# Patient Record
Sex: Female | Born: 1950 | Race: White | Hispanic: No | Marital: Married | Smoking: Former smoker
Health system: Southern US, Community
[De-identification: ages and names within clinical notes are randomized; demographics above are authoritative.]

## PROBLEM LIST (undated history)

## (undated) DIAGNOSIS — G43909 Migraine, unspecified, not intractable, without status migrainosus: Secondary | ICD-10-CM

## (undated) DIAGNOSIS — E785 Hyperlipidemia, unspecified: Secondary | ICD-10-CM

## (undated) DIAGNOSIS — I10 Essential (primary) hypertension: Secondary | ICD-10-CM

## (undated) DIAGNOSIS — M5481 Occipital neuralgia: Secondary | ICD-10-CM

## (undated) DIAGNOSIS — D649 Anemia, unspecified: Secondary | ICD-10-CM

## (undated) DIAGNOSIS — N289 Disorder of kidney and ureter, unspecified: Secondary | ICD-10-CM

## (undated) DIAGNOSIS — Z8673 Personal history of transient ischemic attack (TIA), and cerebral infarction without residual deficits: Secondary | ICD-10-CM

## (undated) DIAGNOSIS — N189 Chronic kidney disease, unspecified: Secondary | ICD-10-CM

## (undated) DIAGNOSIS — K219 Gastro-esophageal reflux disease without esophagitis: Secondary | ICD-10-CM

## (undated) DIAGNOSIS — I639 Cerebral infarction, unspecified: Secondary | ICD-10-CM

## (undated) DIAGNOSIS — M5412 Radiculopathy, cervical region: Secondary | ICD-10-CM

## (undated) HISTORY — DX: Hyperlipidemia, unspecified: E78.5

## (undated) HISTORY — PX: LUNG SURGERY: SHX703

## (undated) HISTORY — PX: KNEE SURGERY: SHX244

## (undated) HISTORY — PX: CERVICAL FUSION: SHX112

## (undated) HISTORY — DX: Chronic kidney disease, unspecified: N18.9

## (undated) HISTORY — DX: Migraine, unspecified, not intractable, without status migrainosus: G43.909

## (undated) HISTORY — DX: Occipital neuralgia: M54.81

## (undated) HISTORY — PX: ELBOW SURGERY: SHX618

## (undated) HISTORY — DX: Cerebral infarction, unspecified: I63.9

## (undated) HISTORY — DX: Gastro-esophageal reflux disease without esophagitis: K21.9

## (undated) HISTORY — PX: CARPAL TUNNEL RELEASE: SHX101

## (undated) HISTORY — PX: ABDOMINAL HYSTERECTOMY: SHX81

## (undated) HISTORY — PX: BLADDER REPAIR: SHX76

## (undated) HISTORY — PX: CERVICAL SPINE SURGERY: SHX589

---

## 1997-08-25 ENCOUNTER — Other Ambulatory Visit: Admission: RE | Admit: 1997-08-25 | Discharge: 1997-08-25 | Payer: Self-pay | Admitting: Gynecology

## 1997-10-04 ENCOUNTER — Inpatient Hospital Stay (HOSPITAL_COMMUNITY): Admission: RE | Admit: 1997-10-04 | Discharge: 1997-10-06 | Payer: Self-pay | Admitting: Gynecology

## 1998-06-18 ENCOUNTER — Encounter: Payer: Self-pay | Admitting: Emergency Medicine

## 1998-06-18 ENCOUNTER — Emergency Department (HOSPITAL_COMMUNITY): Admission: EM | Admit: 1998-06-18 | Discharge: 1998-06-18 | Payer: Self-pay | Admitting: Emergency Medicine

## 1998-09-21 ENCOUNTER — Other Ambulatory Visit: Admission: RE | Admit: 1998-09-21 | Discharge: 1998-09-21 | Payer: Self-pay | Admitting: Gynecology

## 1998-11-14 ENCOUNTER — Ambulatory Visit (HOSPITAL_COMMUNITY): Admission: RE | Admit: 1998-11-14 | Discharge: 1998-11-14 | Payer: Self-pay | Admitting: Orthopedic Surgery

## 1998-11-14 ENCOUNTER — Encounter: Payer: Self-pay | Admitting: Orthopedic Surgery

## 1999-05-10 ENCOUNTER — Encounter: Payer: Self-pay | Admitting: Neurosurgery

## 1999-05-14 ENCOUNTER — Encounter: Payer: Self-pay | Admitting: Neurosurgery

## 1999-05-14 ENCOUNTER — Ambulatory Visit (HOSPITAL_COMMUNITY): Admission: RE | Admit: 1999-05-14 | Discharge: 1999-05-15 | Payer: Self-pay | Admitting: Neurosurgery

## 1999-06-06 ENCOUNTER — Encounter: Payer: Self-pay | Admitting: Neurosurgery

## 1999-06-06 ENCOUNTER — Ambulatory Visit (HOSPITAL_COMMUNITY): Admission: RE | Admit: 1999-06-06 | Discharge: 1999-06-06 | Payer: Self-pay | Admitting: Neurosurgery

## 1999-10-25 ENCOUNTER — Other Ambulatory Visit: Admission: RE | Admit: 1999-10-25 | Discharge: 1999-10-25 | Payer: Self-pay | Admitting: Gynecology

## 2000-11-05 ENCOUNTER — Other Ambulatory Visit: Admission: RE | Admit: 2000-11-05 | Discharge: 2000-11-05 | Payer: Self-pay | Admitting: Gynecology

## 2001-11-03 ENCOUNTER — Encounter: Payer: Self-pay | Admitting: Family Medicine

## 2001-11-03 ENCOUNTER — Encounter: Admission: RE | Admit: 2001-11-03 | Discharge: 2001-11-03 | Payer: Self-pay | Admitting: Family Medicine

## 2001-11-15 ENCOUNTER — Other Ambulatory Visit: Admission: RE | Admit: 2001-11-15 | Discharge: 2001-11-15 | Payer: Self-pay | Admitting: Gynecology

## 2002-11-17 ENCOUNTER — Other Ambulatory Visit: Admission: RE | Admit: 2002-11-17 | Discharge: 2002-11-17 | Payer: Self-pay | Admitting: Gynecology

## 2003-11-20 ENCOUNTER — Other Ambulatory Visit: Admission: RE | Admit: 2003-11-20 | Discharge: 2003-11-20 | Payer: Self-pay | Admitting: Gynecology

## 2004-05-24 ENCOUNTER — Other Ambulatory Visit: Admission: RE | Admit: 2004-05-24 | Discharge: 2004-05-24 | Payer: Self-pay | Admitting: Gynecology

## 2004-11-20 ENCOUNTER — Other Ambulatory Visit: Admission: RE | Admit: 2004-11-20 | Discharge: 2004-11-20 | Payer: Self-pay | Admitting: Gynecology

## 2005-11-24 ENCOUNTER — Other Ambulatory Visit: Admission: RE | Admit: 2005-11-24 | Discharge: 2005-11-24 | Payer: Self-pay | Admitting: Gynecology

## 2006-12-17 ENCOUNTER — Ambulatory Visit (HOSPITAL_COMMUNITY): Admission: RE | Admit: 2006-12-17 | Discharge: 2006-12-17 | Payer: Self-pay | Admitting: Family Medicine

## 2006-12-17 ENCOUNTER — Encounter (INDEPENDENT_AMBULATORY_CARE_PROVIDER_SITE_OTHER): Payer: Self-pay | Admitting: Interventional Radiology

## 2006-12-24 ENCOUNTER — Emergency Department (HOSPITAL_COMMUNITY): Admission: EM | Admit: 2006-12-24 | Discharge: 2006-12-24 | Payer: Self-pay | Admitting: Emergency Medicine

## 2007-01-06 ENCOUNTER — Ambulatory Visit: Payer: Self-pay | Admitting: Cardiothoracic Surgery

## 2007-01-07 ENCOUNTER — Inpatient Hospital Stay (HOSPITAL_COMMUNITY): Admission: EM | Admit: 2007-01-07 | Discharge: 2007-01-16 | Payer: Self-pay | Admitting: Emergency Medicine

## 2007-01-12 ENCOUNTER — Encounter: Payer: Self-pay | Admitting: Cardiothoracic Surgery

## 2007-01-29 ENCOUNTER — Encounter: Admission: RE | Admit: 2007-01-29 | Discharge: 2007-01-29 | Payer: Self-pay | Admitting: Cardiothoracic Surgery

## 2007-01-29 ENCOUNTER — Ambulatory Visit: Payer: Self-pay | Admitting: Cardiothoracic Surgery

## 2007-02-26 ENCOUNTER — Ambulatory Visit: Payer: Self-pay | Admitting: Cardiothoracic Surgery

## 2007-05-07 ENCOUNTER — Encounter: Admission: RE | Admit: 2007-05-07 | Discharge: 2007-05-07 | Payer: Self-pay | Admitting: Cardiothoracic Surgery

## 2007-05-07 ENCOUNTER — Ambulatory Visit: Payer: Self-pay | Admitting: Cardiothoracic Surgery

## 2007-09-03 ENCOUNTER — Encounter: Admission: RE | Admit: 2007-09-03 | Discharge: 2007-09-03 | Payer: Self-pay | Admitting: Family Medicine

## 2007-11-05 ENCOUNTER — Ambulatory Visit: Payer: Self-pay | Admitting: Cardiothoracic Surgery

## 2007-11-05 ENCOUNTER — Encounter: Admission: RE | Admit: 2007-11-05 | Discharge: 2007-11-05 | Payer: Self-pay | Admitting: Cardiothoracic Surgery

## 2007-12-21 ENCOUNTER — Encounter
Admission: RE | Admit: 2007-12-21 | Discharge: 2008-03-20 | Payer: Self-pay | Admitting: Physical Medicine & Rehabilitation

## 2007-12-23 ENCOUNTER — Ambulatory Visit: Payer: Self-pay | Admitting: Physical Medicine & Rehabilitation

## 2008-01-07 ENCOUNTER — Ambulatory Visit: Payer: Self-pay | Admitting: Cardiothoracic Surgery

## 2008-01-17 ENCOUNTER — Ambulatory Visit: Payer: Self-pay | Admitting: Physical Medicine & Rehabilitation

## 2008-02-14 ENCOUNTER — Ambulatory Visit: Payer: Self-pay | Admitting: Physical Medicine & Rehabilitation

## 2008-02-25 ENCOUNTER — Ambulatory Visit: Payer: Self-pay | Admitting: Cardiothoracic Surgery

## 2008-04-17 ENCOUNTER — Encounter: Admission: RE | Admit: 2008-04-17 | Discharge: 2008-07-16 | Payer: Self-pay | Admitting: Anesthesiology

## 2008-04-18 ENCOUNTER — Ambulatory Visit: Payer: Self-pay | Admitting: Anesthesiology

## 2008-05-15 ENCOUNTER — Encounter
Admission: RE | Admit: 2008-05-15 | Discharge: 2008-08-13 | Payer: Self-pay | Admitting: Physical Medicine & Rehabilitation

## 2008-05-16 ENCOUNTER — Ambulatory Visit: Payer: Self-pay | Admitting: Physical Medicine & Rehabilitation

## 2008-06-22 ENCOUNTER — Ambulatory Visit: Payer: Self-pay | Admitting: Physical Medicine & Rehabilitation

## 2008-07-11 ENCOUNTER — Ambulatory Visit: Payer: Self-pay | Admitting: Physical Medicine & Rehabilitation

## 2008-08-03 ENCOUNTER — Ambulatory Visit: Payer: Self-pay | Admitting: Physical Medicine & Rehabilitation

## 2008-09-04 ENCOUNTER — Encounter
Admission: RE | Admit: 2008-09-04 | Discharge: 2008-09-19 | Payer: Self-pay | Admitting: Physical Medicine & Rehabilitation

## 2008-09-12 ENCOUNTER — Ambulatory Visit: Payer: Self-pay | Admitting: Physical Medicine & Rehabilitation

## 2009-12-31 ENCOUNTER — Encounter
Admission: RE | Admit: 2009-12-31 | Discharge: 2010-01-01 | Payer: Self-pay | Source: Home / Self Care | Attending: Physical Medicine & Rehabilitation | Admitting: Physical Medicine & Rehabilitation

## 2010-01-01 ENCOUNTER — Ambulatory Visit: Payer: Self-pay | Admitting: Physical Medicine & Rehabilitation

## 2010-02-05 ENCOUNTER — Encounter: Admission: RE | Admit: 2010-02-05 | Discharge: 2010-02-05 | Payer: Self-pay | Admitting: Family Medicine

## 2010-03-31 ENCOUNTER — Encounter: Payer: Self-pay | Admitting: *Deleted

## 2010-03-31 ENCOUNTER — Encounter: Payer: Self-pay | Admitting: Family Medicine

## 2010-03-31 ENCOUNTER — Encounter: Payer: Self-pay | Admitting: Cardiothoracic Surgery

## 2010-07-15 ENCOUNTER — Encounter: Payer: Worker's Compensation | Attending: Physical Medicine & Rehabilitation

## 2010-07-15 ENCOUNTER — Ambulatory Visit: Payer: Worker's Compensation | Admitting: Physical Medicine & Rehabilitation

## 2010-07-15 DIAGNOSIS — M62838 Other muscle spasm: Secondary | ICD-10-CM | POA: Insufficient documentation

## 2010-07-15 DIAGNOSIS — R079 Chest pain, unspecified: Secondary | ICD-10-CM | POA: Insufficient documentation

## 2010-07-15 DIAGNOSIS — G548 Other nerve root and plexus disorders: Secondary | ICD-10-CM

## 2010-07-15 DIAGNOSIS — R5381 Other malaise: Secondary | ICD-10-CM | POA: Insufficient documentation

## 2010-07-15 DIAGNOSIS — N183 Chronic kidney disease, stage 3 unspecified: Secondary | ICD-10-CM | POA: Insufficient documentation

## 2010-07-15 DIAGNOSIS — Z79899 Other long term (current) drug therapy: Secondary | ICD-10-CM | POA: Insufficient documentation

## 2010-07-15 DIAGNOSIS — R5383 Other fatigue: Secondary | ICD-10-CM | POA: Insufficient documentation

## 2010-07-15 NOTE — Assessment & Plan Note (Signed)
REASON FOR VISIT:  Increasing spasms, left-sided ribcage.  Date of last visit was January 01, 2010.  Date of injury December 07, 2006.  This is a 60 year old female who fell from a ladder onto her left-sided ribs onto a desk.  She sustained a left pleural effusion requiring a chest tube and underwent a mini thoracotomy left fifth rib interspace. She returned to work full-time without restrictions, had relief with T8 and T9 intercostal blocks, but only partial effect from the intercostal RF.  Good relief with Neurontin 100 b.i.d. and then 300 at night when I last saw her, however, increasing spasms have been reported now.  She has a history stage III CKD diagnosed since I last saw her.  She has also been taken off the Lipitor because of overall weakness.  REVIEW OF SYSTEMS:  Positive for spasms mainly back, ribs area.  PHYSICAL EXAMINATION:  VITAL SIGNS:  Blood pressure 165/99, pulse 78, respirations 18, and sat 100% on room air. GENERAL:  In no acute distress.  Mood and affect appropriate.  She has tenderness along her left-sided posterior lateral ribs starting at T5 extending down to about T11.  She has good lumbar range of motion, forward flexion, extension, lateral rotation, and bending.  Gait is normal.  Mood and affect are appropriate.  IMPRESSION:  Intercostal neuralgia, chronic post-traumatic.  Her pain is generalized over a fairly wide area.  PLAN:  We will increase her Neurontin 300 b.i.d.  Continue Lidoderm patch, 2 patches on 12, off 12.  She is to call in 1 month if the b.i.d. 300 mg dose of gabapentin is not sufficient in which case we would go to t.i.d.  If the increased doses of Neurontin are not helpful, she may benefit from repeat radiofrequency procedure.  Her last radiofrequency was performed approximately 2 years ago.  I will see her back for routine monitor in 6 months or sooner if she does not get relief with medication changes.     Erick Colace, M.D. Electronically Signed    AEK/MedQ D:  07/15/2010 10:41:59  T:  07/15/2010 23:28:02  Job #:  [id number]  cc:   Dr. Ethelene Hal [address] [place], [place] [address]

## 2010-07-23 NOTE — Procedures (Signed)
[name], LONSWAY NO.:  [id number]   MEDICAL RECORD NO.:  [id number]           PATIENT TYPE:   LOCATION:                                 FACILITY:   PHYSICIAN:  Erick Colace, M.D.DATE OF BIRTH:  [birth year]   DATE OF PROCEDURE:  DATE OF DISCHARGE:                               OPERATIVE REPORT   PROCEDURE:  Left T9-T10 intercostal nerve block.   INDICATION:  Post-thoracotomy pain syndrome, history of post-thoracotomy  pain on the left, has responded with a 3-week duration of nearly 100% to  prior T8 and T9 intercostal nerve blocks.  This is to further assess  levels and help guide radiofrequency neurotomy as a treatment option.   Informed consent was obtained after describing the risks and benefits of  the procedure with the patient.  These include bleeding, bruising,  infection, and pneumothorax.  She elects to proceed.  The patient placed  prone on fluoroscopy table.  Betadine prep, sterilely drape.  A 27-  gauge, 1-1/2-inch needle was used to anesthetize the skin and subcu  tissue with 1% lidocaine x2 mL.  Then, using 1 mL of 1% lidocaine at  each of two sites, a 25-gauge 1-1/2-inch needle was inserted to touch  the rib under fluoroscopic guidance and walking off the inferior aspect.  Live fluoro with contrast was utilized.  Appropriate contrast spread  followed by injection of 1 mL of 1% lidocaine and 0.5 mL of 40 mg/mL  Depo-Medrol.  The patient tolerated the procedure well.  Pre and post-  injection vitals stable.  Post-injection instructions given.  Pre-  injection pain level 9/10.  Post-injection 0/10.   We will schedule with Dr. Stevphen Rochester for possible radiofrequency evaluation  should pain recur, which I would imagine it should.      Erick Colace, M.D.  Electronically Signed     AEK/MEDQ  D:  02/14/2008 09:08:32  T:  02/14/2008 22:51:31  Job:  [id number]

## 2010-07-23 NOTE — Assessment & Plan Note (Signed)
A 60 year old female who has a work-related injury.  She has a history  of intercostal neuralgia and chronic post-thoracotomy pain syndrome, has  had temporary relief with T9 intercostal blocks, but no continued  improvement.  Radiofrequency neurotomy at T9 did not give her any  significant improvement.  We trialed her on lower dose Neurontin,  however, once again problems with hangover effect in the morning.  She  has been taking 100 mg b.i.d. during the day and 300 at night prior to  that just taking 100 q.i.d., has not tried Lyrica.  She does quite well  with Lidoderm patch, 2 patches on her left side of chest, but she only  wears this during the day and at night, she does not have any topical  agent.   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  She can walk 1 hour at a time.  She can climb steps.  She can  drive.  She is independent with her self-care.   PHYSICAL EXAMINATION:  GENERAL:  No acute stress.  Orientation x3.  Affect is alert.  Gait is normal.  There is tenderness of the left  lateral ribs.  She has normal strength and sensation at lower  extremities.  Normal gait.  Pain score is 9/10.  VITAL SIGNS:  Her blood pressure is 135/84, pulse 89, respirations 18,  and O2 sat 97% on room air.   IMPRESSION:  1. Chronic post-thoracotomy pain syndrome.  2. Intercostal neuralgia.   PLAN:  1. We will continue the Lidoderm patch on q.a.m. and off q.p.m.  2. We will trial Lyrica 50 b.i.d., and then increase to 75 b.i.d.  We      will discontinue Neurontin.  3. I will see her back in 1 month to see how she is doing, may need to      go up on the Lyrica dose.  If she is doing quite well, she may be      at MMI at that time; if not, we may have to titrate or go back to      the Neurontin once again.      Erick Colace, M.D.  Electronically Signed     AEK/MedQ  D:  08/03/2008 15:29:29  T:  08/04/2008 05:20:39  Job #:  [id number]

## 2010-07-23 NOTE — Assessment & Plan Note (Signed)
OFFICE VISIT   [name], [name]  DOB:  [birth year]                                        May 07, 2007  CHART #:  [id number]   CURRENT PROBLEMS:  1. Status post left thoracotomy and decortication for post-traumatic      hemothorax.  2. Hypertension.  3. Post-thoracotomy pain.   HISTORY OF PRESENT ILLNESS:  Ms. [name] returns for final surgical  followup after undergoing decortication November 2008.  Her only  complaint is some incisional soreness and post-thoracotomy stinging pain  on the left inframammary area.  She has no cough, congestion, shortness  of breath or fever.  The surgical incision is well healed.   PHYSICAL EXAMINATION:  VITAL SIGNS:  Blood pressure 120/80, pulse 90,  respirations 18, saturation 97%.  LUNGS:  Breath sounds are clear and equal.  She has good range of motion  of the left upper extremity.  The left thoracotomy incision is well  healed.  CARDIAC:  Rhythm is regular.   A PA and lateral chest x-ray shows complete resolution of the pleural  thickening on the left with clear lungs and no pleural effusion.   IMPRESSION:  Resolution of left hemothorax following decortication.  She  was assured that the post-thoracotomy pain would improve and she could  continue normal daily activities, and she is back to work, which is  appropriate.  She will return as needed.   Kerin Perna, M.D.  Electronically Signed   PV/MEDQ  D:  05/07/2007  T:  05/08/2007  Job:  [id number]

## 2010-07-23 NOTE — Procedures (Signed)
NAMEJAIDENCE, GEISLER NO.:  [id number]   MEDICAL RECORD NO.:  [id number]          PATIENT TYPE:  REC   LOCATION:  [facility]                          FACILITY:  [facility]   PHYSICIAN:  Erick Colace, M.D.DATE OF BIRTH:  [birth year]   DATE OF PROCEDURE:  DATE OF DISCHARGE:                               OPERATIVE REPORT   PROCEDURE:  This is a left T9 and T10 intercostal nerve block.   INDICATIONS:  Post thoracotomy pain syndrome.    Dictation ended at this point.      Erick Colace, M.D.  Electronically Signed     AEK/MEDQ  D:  02/14/2008 09:18:43  T:  02/15/2008 01:21:25  Job:  [id number]   cc:   Kerin Perna, M.D.  [address]  [place]  [place] [address]

## 2010-07-23 NOTE — H&P (Signed)
[name], STREIGHT NO.:  [id number]   MEDICAL RECORD NO.:  [id number]          PATIENT TYPE:  INP   LOCATION:  6738                         FACILITY:  [facility]   PHYSICIAN:  Beckey Rutter, MD  DATE OF BIRTH:  06-17-1950   DATE OF ADMISSION:  01/06/2007  DATE OF DISCHARGE:                              HISTORY & PHYSICAL   PRIMARY CARE PHYSICIAN:  The patient is unassigned to [facility]   CHIEF COMPLAINT:  Fever.   HISTORY OF PRESENT ILLNESS:  This is a 60 year old female with past  medical history significant for hypertension, migraine, and multiple rib  fractures left-sided after fall and trauma, presented today with chief  complaint of fever and generalized weakness.  The patient was in her  usual state of health up to 4 weeks ago when she fell down and hit her  office desk on the left side resulting in pain that was complicated with  severe shortness of breath 2 weeks after that.  The patient was found to  have a hemothorax at that time and she underwent thoracocentesis.  Today  the patient started to feel generalized weakness, fatigue and she  noticed that she has fever.  Patient has minimal cough that is  nonproductive.  The patient saw her urgent care physician that she uses  to see before, when an x-ray was done and suspicion of consolidation on  the left chest wall was noticed and then the patient was sent to the  emergency room for further evaluation and management.   PAST MEDICAL HISTORY:  1. Significant for migraine  2. GERD  3. Hypertension  4. History of four through seven left rib fracture and hemothorax.   FAMILY HISTORY:  Noncontributory.   SOCIAL HISTORY:  No drug abuse, not a drinker, not a smoker.   MEDICATION ALLERGY:  To SHELLFISH.   MEDICATIONS:  1. Lisinopril  2. Topamax  3. Librax  4. Lasix.  5. Nexium.  6. Cipro.   REVIEW OF SYSTEMS:  The patient admits to have fever but denied nausea,  vomiting or headache.  The rest  of review of systems unremarkable.   EXAM:  Temperature is 101.4, respiratory rate is 20, heart rate is 108,  blood pressure is 106/69.  GENERALLY:  She was lying flat in bed not in  acute distress.  HEENT Atraumatic, normocephalic.  Eyes: PERRL.  Mouth moist.  No ulcer.  NECK:  Supple.  No JVD.  CHEST: Tenderness on the left side of the chest diffuse.  On  auscultation the patient has soft crepitation midzone posteriorly on the  left side of the chest.  The right side auscultation is unremarkable.  PRECORDIUM first and second heart sounds audible.  No added sounds  ABDOMEN:  Soft, nontender.  Bowel sounds present.  EXTREMITIES: No lower extremity edema.  NEUROLOGICALLY:  Patient is alert, oriented x3, moving all her  extremities spontaneously.   LABS AND X-RAY:  White blood count is 9.7, hemoglobin is 13.1,  hematocrit is 39.2, platelet count is 307. Sodium is 139, potassium 3.4,  chloride 105, bicarb 26, BUN 17, glucose  126, creatinine is 1.4.  Chest X-Ray: The patient brought result from outside written and the  employee's work status report showing consolidation pneumonia/effusion.  The patient also has CT chest on December 24, 2006 showing large pleural  effusion.   ASSESSMENT AND PLAN:  This is a 60 year old female with hypertension,  came today with pneumonia.  The possibility of empyema cannot be ruled  out as of now and for that we will consider CT chest.  1. The patient will be admitted for further assessment and management.  2. CT chest to rule out empyema.  3. Will start antibiotics rochephin and Zithromax  4. Further management will be determined after the result of CT chest.   1. Hypertension.  The patient has taken lisinopril for hypertension.      We will continue on lisinopril.   1. Migraine will continue the patient on Topamax.   1. For GI prophylaxis I will start the patient on Nexium which the      patient has taking before. For DVT prophylaxis I will start  the      patient on sequential pneumatic device pending the result of CAT      scan.      Beckey Rutter, MD  Electronically Signed     EME/MEDQ  D:  01/07/2007  T:  01/07/2007  Job:  [id number]

## 2010-07-23 NOTE — Group Therapy Note (Signed)
CONSULT REQUESTED BY:  Kerin Perna, MD   DATE OF INJURY:  As reported by the patient, December 07, 2006.   CHIEF COMPLAINT:  Left-sided rib pain.   REASON FOR CONSULTATION:  Consult requested by Dr. Kathlee Nations Trigt to  evaluate for left post-thoracotomy pain syndrome.   HISTORY OF PRESENT ILLNESS:  Ms. Achey is a 60 year old female who had  a past medical history significant for cervical spondylosis with ACDF C4-  C5 level several years ago, but was otherwise in good health.  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  She had immediate onset of pain; however, it did progress over  time, and she sought medical treatment at an Urgent [facility] Center.  She  was evaluated and not felt need any acute hospitalization.  However, she  did have increasing pain over time and presented to the emergency room  on December 24, 2007 with a left pleural effusion.  She had another ED  visit 2 weeks later on January 07, 2008, at which time she was admitted,  had chest tube placed, and then underwent attempted video-assisted  thoracoscopy, but because of excessive adhesions in the area, had to  actually have a mini thoracotomy done at the left fifth interspace.  She  had no postoperative complications, did have some antibiotics for  infection in that region.  She states that her pain characteristics  changed somewhat after the surgery, although she had really had pain in  this area ever since this fall.   This is a Workers' Compensation injury and has not been settled yet.   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  She  has taken some hydrocodone at night, but does not want to take this  during the day.  She has not tried any neuropathic pain medications, any  type of topical agents other than  OTC counter irritant patches.   REVIEW OF SYSTEMS:  Positive for night sweats, but no daytime fevers.   PAST SURGICAL HISTORY:  Notable for C4-C5 laminectomy ACDF per Dr.  Gerlene Fee in 2003.   SOCIAL HISTORY:  Married, lives with her husband, works full-time.   Oswestry disability score calculated today 42% putting her in the severe  range.   PHYSICAL EXAMINATION:  VITAL SIGNS:  Blood pressure 144/91, pulse 106,  respiratory rate 18, and O2 sat 99% on room air.  GENERAL:  Well-developed, well-nourished female in no acute distress.  EXTREMITIES:  Without edema.  Orientation x3.  Affect is alert.  She  appears to be mildly anxious.  Her gait is normal.  She is able to toe  walk or heel walk.  She has normal coordination in bilateral upper and  lower extremities.  Normal deep tendon reflexes in bilateral upper and  lower extremities.  Normal sensation.  She has 5/5 strength bilateral  deltoid, biceps, triceps, grip as well as hip flexion, knee flexion, or  ankle dorsiflexors.  She has full range of motion bilateral upper and  lower extremities.  She has full lumbar spine and cervical spine range  of motion as well as thoracic range of motion.  However, when she bends  toward the left side laterally, she does have pain around the fifth,  sixth, and  seventh rib area.   Her thoracic spine area is tender to palpation, however, in the  posterolateral angle of the ribs.  She does have tenderness starting  around the incision site, which is at around the T6 area.  She has mild  tenderness around the chest tube site, which is 2 levels below.   She has no skin lesions in that area.  She has no scar hypersensitivity.   IMPRESSION:  Post-thoracotomy pain syndrome.  I feel that she has an  intercostal neuralgia component as well as a myofascial component.   PLAN:  1. We will go ahead and start with some gabapentin 100 mg nightly x3      days, b.i.d. x3 days, then t.i.d.  2. We will start  Lidoderm patch over that area, on every morning, off      every evening.  3. Scheduled for intercostal nerve block under fluoroscopic guidance.      I explained the risks and benefits including pneumothorax and      infection.  She elects to proceed.  4. We will check a urine drug screen in the event we use narcotic      analgesics.   She has not reached maximum medical improvement yet.  I do not  anticipate any partial permanent disability rating and expect that while  she will have some improvement with pain management treatment, it is  likely she will not get back to her baseline.      Erick Colace, M.D.  Electronically Signed     AEK/MedQ  D:  12/23/2007 16:25:08  T:  12/24/2007 00:38:34  Job #:  [id number]   cc:   Kerin Perna, M.D.  [address]  [place]  [place] [address]

## 2010-07-23 NOTE — Assessment & Plan Note (Signed)
Ms. Santucci follows up today.  She was seen by me on June 23, 2008, with  left-sided T8 intercostal nerve block which gave her good relief for 1  day.  She had a previous RF T9 which did not give her any significant  improvement, where as the prior injection did.  She has history of a  fall hitting her left wrist in the corner of a desk, then she developed  pleural effusion, needed chest tube placement and underwent  minithoracotomy of the left fifth interspace.  No postoperative  complications.  She has done reasonably well during the day with  Lidoderm patch over that area and night is her main problem.  She has  difficulty sleeping.  Pain is worse at night around 9.  During the day,  it is tolerable.  Her Oswestry disability index is 40%, this is not a  rating, however.   PHYSICAL EXAMINATION:  VITAL SIGNS:  Her blood pressure is 145/87, pulse  108, respirations 18, and 02 saturation is 98% on room air.  GENERAL:  No acute stress.  Orientation x3.  Affect is alert.  Gait is  normal.  EXTREMITIES:  There is tenderness over the left lateral ribs.  She has  normal strength and sensation in the bilateral lower extremity, normal  deep tendon reflexes, and normal gait.   IMPRESSION:  Intercostal neuralgia with chronic post-thoracotomy pain.  She has gotten temporary relief with intercostal nerve blocks at T9  levels.  However, no continued improvement.  I think the main goal at  this point is getting her more comfortable at night by increasing  Neurontin at night.  We will increase to 300 mg at night and continue  the 100 mg b.i.d. during the day.  I will see her back in 1 month.  If  not much better, we will increase her Neurontin once again to 600 at  night and trial 300 b.i.d. during the day.  We will continue Lidoderm  patch.  Discussed with the patient and her case Production designer, theatre/television/film.  If she is  sleeping better through the night, next month we will just put her at  MMI and rate her.      Erick Colace, M.D.  Electronically Signed     AEK/MedQ  D:  07/11/2008 17:28:55  T:  07/12/2008 81:19:14  Job #:  [id number]   cc:   Kerin Perna, M.D.  [address]  [place]  [place] [address]   [place] [phone]

## 2010-07-23 NOTE — Procedures (Signed)
[name], [name] NO.:  [id number]   MEDICAL RECORD NO.:  [id number]           PATIENT TYPE:   LOCATION:                                 FACILITY:   PHYSICIAN:  Celene Kras, MD             DATE OF BIRTH:   DATE OF PROCEDURE:  DATE OF DISCHARGE:                               OPERATIVE REPORT   [name] comes to [facility].  I evaluated  her and reviewed the Health and History form and 14-point review of  systems.   1. Reviewed the chart, progress to date overall directed care      approach.  An individual with radicular pain secondary to rib pain      and injury, demonstrated a positive predictive experience with      greater than 85% relief cycling after Dr. Trecia Rogers intercostal      block.  2. It is reasonable to go on to RF, to use pulse technology.  I have      reviewed this with her.  She has had 9 and 10-block, she has also      an 8 pain, almost started what I perceive is the most problematic      at 8, and then I will follow expectantly.  I have reviewed this      procedure in detail.  Risks, complications, and options are fully      outlined.  The patient familiar with me.  We will follow her      expectantly.   Objectively, she has the incision as well noted.  Diffuse parathoracic  myofascial discomfort and pain with side bending.  Some peri-incisional  discomfort, dominant left.  Nothing new neurologically.   IMPRESSION:  Peripheral neuropathy, unspecified; intercostal neuralgia,  left.   PLAN:  Pulsed radiofrequency neural ablation, peripheral nerve  modulation, left side, T9, under local anesthetic, and she is consented.  Predicated further intervention based on need.  Understands potential  for pneumothorax, bleeding, infection, nerve damage, stroke, seizure,  death, other unforeseen problems not commonly encountered, particularly  reactive response unexpectedly to drug.  She is consented.   The patient was  taken to fluoroscopy suite and placed in prone position.  Back prepped and draped in usual fashion using a 10-mm active tip,  inferior margin of T9, and confirmed placement in multiple fluoroscopic  positions.  Once confirmation is made, we appropriately test, and then  follow with 2 mL of Marcaine 0.5% MPF at each level at T9, and 40 of  Aristocort.   We then performed sequential pulsed radiofrequency procedure, 100  seconds, at 42 degrees.   She tolerated the procedure well.  No complications from our procedure.  Discharge instructions given.  We will consider sequential intervention  to adjacent rib as needed.  I will see her in 1 month.           ______________________________  Celene Kras, MD    HH/MEDQ  D:  04/18/2008 10:52:32  T:  04/18/2008 23:37:59  Job:  [id number]

## 2010-07-23 NOTE — Procedures (Signed)
[name], GIBEAULT NO.:  [id number]   MEDICAL RECORD NO.:  [id number]           PATIENT TYPE:   LOCATION:                                 FACILITY:   PHYSICIAN:  Erick Colace, M.D.DATE OF BIRTH:  May 11, 1950   DATE OF PROCEDURE:  DATE OF DISCHARGE:                               OPERATIVE REPORT   This is T8 intercostal nerve block, left side.   INDICATION:  Intercostal neuralgia due to history of rib fracture and  injury due to fall.  This is at the former chest tube insertion site  where she had pleural effusion in the past.  She has had prior relief  with intercostal nerve block.  She has had partial relief with T9  intercostal RF evaluating for T8, recurrence of pain.   Informed consent was obtained describing the risks and benefits of  procedure with the patient.  These include bleeding, bruising, infection  as well as pneumothorax.  She elects to proceed and has given written  consent.  The patient is not taking any anticoagulant medications.  No  antibiotics.   The patient placed in prone position.  Area marked and prepped with  Betadine and alcohol.  Under fluoroscopic guidance, counted back ribs  from T12-T8.  Skin and subcu tissues infiltrated with 25-gauge inch and  half needle, 1% lidocaine x2 mL.  Then the same needle was directed to  the inferior border of T8 at the posterior lateral angle.  Then,  Omnipaque 180 infiltrated along the inferior aspect T8 x 0.5 mL.  No  intravascular uptake noted.  Followed by injection of 1 mL of 2% MPF  lidocaine and 0.5 mL of 40 mg/mL Depo-Medrol after negative drawback for  blood.  The patient tolerated procedure well.  Pre and post injection  vitals stable.  Post-injection instructions given.  Return in 2 weeks  for followup appointment to monitor how she does.  Discussed with case  Production designer, theatre/television/film.      Erick Colace, M.D.  Electronically Signed     AEK/MEDQ  D:  06/22/2008 13:30:19  T:   06/23/2008 05:01:05  Job:  [id number]

## 2010-07-23 NOTE — Procedures (Signed)
NAMEWILMINA, [name] NO.:  [id number]   MEDICAL RECORD NO.:  [id number]         PATIENT TYPE:  [facility]   LOCATION:                                 FACILITY:   PHYSICIAN:  Erick Colace, M.D.DATE OF BIRTH:  03-Jun-1950   DATE OF PROCEDURE:  DATE OF DISCHARGE:                               OPERATIVE REPORT   This is a left T8 and left T9 intercostal nerve block.   INDICATIONS:  Post-thoracotomy pain syndrome, history of post-  thoracotomy pain.  She has pain that is actually chest tube or  thoracoscopy sites.   The pain is only partially responsive to medication management and  interferes with self-care and mobility.   The patient placed prone on fluoroscopy table.   Informed consent was obtained after describing risks and benefits of the  procedure with the patient.  These include bleeding, bruising,  infection, and pneumothorax.  She elects to proceed.   The patient was placed prone on fluoroscopy table.  Betadine prep,  sterile drape.  A 27-gauge 1/2-inch needle was used to anesthetize the  skin and subcu tissue, 2 mL of 1% lidocaine at each of 2 sites.  Then a  25-gauge inch and a half needle was inserted first to touch the rib  under fluoroscopic guidance and then walking off to the superior aspect.  Live fluoroscopy with contrast was utilized.  Appropriate contrast  spread was obtained followed by injection of 2 mL of a solution  containing 1 mL of 10 mg/mL dexamethasone, 2 mL of 0.25% Sensorcaine,  and 2 mL of 1% lidocaine.  The procedure was performed on one level  below, i.e., T9.  The patient tolerated the procedure well.  Post  injection instructions given.  Pre injection pain level 9/10, post  injection 0/10.  Return in 1 month for possible reinjection if pain  recurs.      Erick Colace, M.D.  Electronically Signed     AEK/MEDQ  D:  01/17/2008 09:32:56  T:  01/17/2008 23:37:57  Job:  [id number]

## 2010-07-23 NOTE — Assessment & Plan Note (Signed)
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  She was injured on the job in  October 2009.  She had left pleural effusion after falling onto her ribs  in a corner of a desk from a ladder.  She had a mini thoracotomy done in  left fifth interspace.  She has been able to return to her job.  She has  had successful nerve blocks on the left side T8 and T9 levels.  She has  undergone pulse radiofrequency left T9 level.  She had temporary relief  with that, but then some increased pain per her report.  She has not had  any further falls.  She has been working her usual job.  However, she  has had an upper respiratory infection and has been coughing a lot  lately.   Her review of systems is positive for weight gain, poor appetite,  coughing, wheezing, URI for 2 weeks.   Social, is married.   PHYSICAL EXAMINATION:  Blood pressure 119/70, pulse 97, respirations 18,  O2 sat 98% on room air.  Orientation x3.  Affect is alert.  Gait is  normal.  She has tenderness over the T8 area, less over T9 and T10.   Her lumbar spine range of motion is normal.   Gait is normal.   IMPRESSION:  1. Intercostal neuralgia.  2. Chronic postthoracotomy pain syndrome.   PLAN:  We will reblock her at T8, and if this is helpful in reducing her  pain we will send her to Dr. Stevphen Rochester for T8 pulse radiofrequency  procedure.  Discussed this with the patient.  We will also increase her  Neurontin to 100 mg q.i.d. and increase her Lidoderm patches to 2  patches to ensure adequate coverage.  I had her case manager, Dr. Ethelene Hal,  come back with her.  Explained the situation.      Erick Colace, M.D.  Electronically Signed     AEK/MedQ  D:  05/16/2008 13:04:49  T:  05/17/2008 01:40:03  Job #:  [id number]   cc:   Stacie Acres. Cliffton Asters, M.D.  Fax: [phone]   Kerin Perna, M.D.  [address]  [place]  [place] [address]   Dr. Ethelene Hal.

## 2010-07-23 NOTE — Assessment & Plan Note (Signed)
OFFICE VISIT   [name], [name]  DOB:  [birth year]                                        January 07, 2008  CHART #:  [id number]   CURRENT PROBLEMS:  1. Status post left thoracotomy with decortication of loculated      hemothorax in November 2008.  2. Left-sided post thoracotomy pain followed by Dr. Erick Colace at the pain clinic.   HISTORY OF PRESENT ILLNESS:  The patient returns for a 54-month followup.  She has persistent significant chest wall pain.  She is being treated  with a Lidoderm pain patch and Neurontin by Dr. Wynn Banker.  A nerve  block is scheduled for early November.  She still takes one Vicodin  every evening to help her sleep and without that she cannot sleep she  states.  She has had a flu vaccine.  She has no symptoms of upper  respiratory infection.  She still has left-sided pain radiating to the  inframammary crease from the thoracotomy incision.   PHYSICAL EXAMINATION:  VITAL SIGNS:  She is afebrile.  Blood pressure  140/90, pulse 90 and regular, respirations 18, and saturation 99%.  LUNGS:  Breath sounds are clear.  CHEST:  The thoracotomy incision is well healed.   No x-rays were taken today.   PLAN:  I had a long discussion with the patient about her narcotic  dependence.  She needs to wean off this to a nonnarcotic Tylenol based  or nonsteroidal-based analgesic system.  I gave her one more  prescription for Vicodin to carry over until the nerve block.  She will  return for a 59-month followup.   Kerin Perna, M.D.  Electronically Signed   PV/MEDQ  D:  01/07/2008  T:  01/08/2008  Job:  [id number]
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
## 2010-07-23 NOTE — Discharge Summary (Signed)
NAMEMICHALA, [name] NO.:  [id number]   MEDICAL RECORD NO.:  [id number]          PATIENT TYPE:  INP   LOCATION:  2004                         FACILITY:  [facility]   PHYSICIAN:  Lonia Blood, M.D.       DATE OF BIRTH:  Nov 05, 1950   DATE OF ADMISSION:  01/06/2007  DATE OF DISCHARGE:  01/16/2007                               DISCHARGE SUMMARY   DISCHARGE DIAGNOSES:  Please refer to the previously dictated discharge  summary done by Dr. Olena Leatherwood.   DISCHARGE MEDICATIONS:  Refer to the previously dictated discharge  summary.   CONDITION ON DISCHARGE:  [name] was discharged in good condition.  She was instructed follow up with Dr. Donata Clay on January 29, 2007.   For list of procedures, consultations, history of present illness,  hospital course, refer to the previously dictated discharge summary.   HOSPITAL COURSE:  For hospital course covering November 7 to January 16, 2007, refer to the current dictation.   During the last 2 days of the  hospitalization, [name] had a  progressive course of improvement.  The chest tube was discontinued on  January 15, 2007.  The patient has remained without respiratory distress  and she did not have recurrence of her fever or sepsis.  Final cultures  of the pleural fluid revealed no growth.  A repeat portable chest x-ray  on January 16, 2007, showed a minimal left apical pneumothorax and very  small left pleural effusion, and overall improvement in the basilar  aeration.  [name] was discharged in good condition on January 16, 2007, and instructed to follow up with Dr. Donata Clay 2 weeks after  discharge.      Lonia Blood, M.D.  Electronically Signed     SL/MEDQ  D:  02/18/2007  T:  02/18/2007  Job:  [id number]

## 2010-07-23 NOTE — Discharge Summary (Signed)
NAMEMYLIA, PONDEXTER NO.:  [id number]   MEDICAL RECORD NO.:  [id number]          PATIENT TYPE:  INP   LOCATION:  6738                         FACILITY:  [facility]   PHYSICIAN:  Kerin Perna, M.D.  DATE OF BIRTH:  [birth year]   DATE OF ADMISSION:  01/06/2007  DATE OF DISCHARGE:  01/07/2007                               DISCHARGE SUMMARY   REASON FOR CONSULTATION:  Recurrent left post-traumatic pleural  effusion.   CHIEF COMPLAINT:  Left chest pain and shortness of breath.   HISTORY OF PRESENT ILLNESS:  I was asked to evaluate this 60 year old  white female, nonsmoker for evaluation of a recurrent post traumatic  left pleural effusion.  The patient fell at work approximately 4 weeks  ago striking her left lower chest against the corner of a desk.  She  developed pain and shortness of breath and was evaluated in Urgent Care  where x-ray showed a left pleural effusion.  A CT scan was performed  which showed rib fractures and she underwent a thoracentesis 11 days ago  which removed 850 mL of bloody fluid with negative cultures, negative  gram stain and negative cytology.  The patient has been treated with  oral course of Levaquin.  She recently had recurrent symptoms of  shortness of breath and chest pain, and a new x-ray at Urgent Care  showed a moderate to large recurrent left pleural effusion.  A CT scan  performed today demonstrated no clear evidence of empyema.  There is no  evidence of injury to any other thoracic structures or abdominal organs.  Because of the recurrent left effusion.  A thoracic surgical evaluation  was requested over the concern of empyema as the patient had a  temperature of 101 today with a white count of 9.7.  She has had a dry  nonproductive cough, mild weight loss, weakness and persistent pleuritic  left-sided lower chest pain.   PAST MEDICAL HISTORY:  1. GERD.  2. Hypertension.  3. Migraine headaches.  4. Left traumatic pleural  effusion with fractures of left 5, 6 and 7      ribs posteriorly.   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   FAMILY HISTORY:  Negative.   MEDICATIONS:  Lisinopril, Topamax, Librax, Lasix, Nexium and Levaquin  recently for the left pleural effusion, left lung contusion.   REVIEW OF SYSTEMS:  Mild weight loss low grade fever, shortness of  breath and left pleuritic chest pain are her main symptoms here.  She  denies any major prior surgical procedures, bleeding disorders or  neurologic problems other than migraine headache.  She denies any  hematuria, mild constipation secondary to narcotics, but no blood per  rectum.   PHYSICAL EXAMINATION:  VITAL SIGNS:  Temperature 101, blood pressure  130/70, pulse 90, respirations 20, saturation 97%.  GENERAL APPEARANCE:  A middle-aged female in her hospital room anxious  but in no acute distress.  HEENT:  Exam is normocephalic.  Pupils equal.  NECK:  Without swelling, crepitus or mass.  LYMPHATICS:  Showed no palpable supraclavicular or cervical adenopathy.  LUNGS:  Breath sounds are diminished at the left base, clear on the  right.  CARDIAC:  Exam is regular rhythm without S3 gallop or murmur.  Thorax is  without deformity, tenderness posteriorly over the left seventh rib  area, but no instability of the chest wall.  ABDOMEN:  Soft, mildly obese, nontender.  EXTREMITIES:  No clubbing, cyanosis or edema.  Peripheral pulses are  intact in all extremities.  NEUROLOGIC:  Exam is intact.   LABORATORY DATA:  I reviewed her chest x-ray and CT scan.  She has a  left moderate left pleural effusion without clear evidence of empyema  with a mildly elevated white count of 9.7, normal hemoglobin of 13.1 and  glucose 126 with creatinine 1.4.   IMPRESSION/PLAN:  The patient has a recurrent traumatic left effusion.  I recommend proceeding with a thoracentesis with analysis of  the fluid.  If thoracentesis does not completely or significantly relieve the  effusion, or if there are positive signs of infection of the fluid or if  the fever does not resolve, then I would recommend proceed with a left  VATS decortication.  Will ask radiologist to proceed with the ultrasound  directed thoracentesis tomorrow, and I agree with the plans to cover  this patient with Rocephin at this time.  Will also ask the respiratory  therapist to start her on incentive spirometry.  I will follow up with  her x-rays after the thoracentesis.      Kerin Perna, M.D.  Electronically Signed     PV/MEDQ  D:  01/07/2007  T:  01/08/2007  Job:  [id number]

## 2010-07-23 NOTE — Assessment & Plan Note (Signed)
OFFICE VISIT   ASHANTIA, AMARAL RENEE  DOB:  12-04-1950                                        January 29, 2007  CHART #:  [id number]   CURRENT PROBLEMS:  1. Status post left vats decortication for posttraumatic hemothorax.  2. Hypertension.   HISTORY OF PRESENT ILLNESS:  Ms. Cortese is a 60 year old female returns  for her first postop office visit after undergoing left vats  decortication for a loculated complex hemothorax following a fall at  work.  She developed fever, shortness of breath and significant chest  wall discomfort which was not relieved by several thoracentesis  therapies.  Following that decortication she has had no fever and her  breathing is much improved.  She still has post thoracotomy incisional  pain and is taking hydrocodone once at night.  She is walking daily and  has had no fever and the surgical incision is healing well.   PHYSICAL EXAMINATION:  On exam, blood pressure is 130/80, pulse is 80,  respirations 18, saturations 98%.  Breath sounds are clear and equal.  The mini thoracotomy incision is well healed and the chest tube sites  are well healed.   Her chest x-ray shows some mild pleural thickening at the left base  otherwise clear and there is no pleural effusion.   PLAN:  The patient will resume driving and light activities.  I will see  her back in one month with a chest x-ray.  I have provided her with  another prescription for Tylox .   Kerin Perna, M.D.  Electronically Signed   PV/MEDQ  D:  01/29/2007  T:  01/30/2007  Job:  [id number]   cc:   Stacie Acres. Cliffton Asters, M.D.

## 2010-07-23 NOTE — Assessment & Plan Note (Signed)
HISTORY:  A 60 year old female with prior history of ACDF C4-5 several  years ago, was otherwise in good health.  She is up on a ladder at work  on December 07, 2006, when she fell off at her left ribs on the corner  of the desk with immediate onset of pain, which progressed over time.  She [facility].  She was treated  and released.  She presented to the ED on December 24, 2007 with a left  pleural effusion, treated and released and once again seen in the ED on  January 07, 2008 at which time, she was admitted to chest tube placed  and underwent attempted video-assisted thoracoscopy.  Because of  excessive adhesions in that area, she underwent a mini thoracotomy at  the left fifth interspace.  She returned to work full-time without  restrictions.  She saw me initially on December 23, 2007.  She underwent  intercostal nerve blocks on the left side at T8 and T9, which resulted  in improvement of pain from the 9/10 to 0/10.  She had a repeat  procedure on February 14, 2008.  Pain level is 9/10 pre and 0/10 post.  She then underwent intercostal RF at T9, but this was only partially  effective.  She, therefore, underwent T8 intercostal nerve block and her  pain is reduced by about 40%.  She was trialed on Neurontin.  She cannot  take a large daytime dose due to sedation.  Therefore, switched to  Lyrica and this had worked about as well as Neurontin.  She has had some  good relief with a Lidoderm patch.  She has been independent with her  self-care and mobility.   Her pain does get worse with sitting and standing improves with rest and  her medications.   CURRENT MEDICATIONS:  Lyrica 75 b.i.d., but was doing actually equally  well as Neurontin 100 b.i.d. with 300 nightly.   PHYSICAL EXAMINATION:  VITAL SIGNS:  Blood pressure 123/94, pulse 170,  respirations 18, and O2 sat 99% on room air.  GENERAL:  Well-developed and well-nourished female in no acute  distress.  Looks only mildly anxious.  Orientation x3.  Gait is normal.  EXTREMITIES:  Without edema.  Coordination normal in the upper  extremity.  Deep tendon reflexes are normal in upper and lower  extremity.  Sensation is hyperesthetic on the left side only at the T8,  T9, and T10 dermatomes.  This starts at the posterior axillary line and  proceeds medially along the ribs just inferior to the angle of the  scapula.   There is no erythema.  No skin lesions in that area.  Right side has  normal sensation.   IMPRESSION:  1. 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  I believe that this      was related to her work injury on December 07, 2006.  I do believe      she has reached maximal medical improvement.  She has no permanent      restrictions.  She will be on chronic pain medications as listed      above, specifically Lidoderm patch on 2 patches on 12, off 12 and      Neurontin 100 mg b.i.d. and 300 mg at night.  She will be on these      medications on ongoing basis to allow her to complete her work      activities.   I have rated her  partial permanent disability as 5% whole person using  the AMA guidance fifth addition, page 389.  I will see the patient on a  p.r.n. basis that would be available to see her, should her primary care  physician not be  able to prescribe these medications.  Case manager will look into this  further and discussed with the patient and agrees with plan.      Erick Colace, M.D.  Electronically Signed     AEK/MedQ  D:  09/12/2008 16:20:37  T:  09/13/2008 03:31:19  Job #:  [id number]   cc:   Wayne Sever  Fax [phone]   Kerin Perna, M.D.  [address]  [place]  [place] [address]

## 2010-07-23 NOTE — Op Note (Signed)
NAMEAYRABELLA, LABOMBARD NO.:  [id number]   MEDICAL RECORD NO.:  [id number]          PATIENT TYPE:  INP   LOCATION:  3308                         FACILITY:  [facility]   PHYSICIAN:  Kerin Perna, M.D.  DATE OF BIRTH:  [birth year]   DATE OF PROCEDURE:  01/12/2007  DATE OF DISCHARGE:                               OPERATIVE REPORT   OPERATION:  1. Left VATS, mini thoracotomy and decortication of the left lower      lobe.  2. Placement of On-Q wound irrigation system.   SURGEON:  Kerin Perna, M.D.   ASSISTANT:  Coral Ceo, PA-C.   ANESTHESIA:  General.   PREOPERATIVE DIAGNOSES:  Loculated hemothorax of the left lower pleural  space.   POSTOPERATIVE DIAGNOSES:  Loculated hemothorax of the left lower pleural  space.   INDICATIONS:  The patient is a 60 year old female who sustained a fall  on her left side with rib fractures and a hemothorax.  This was  initially treated with thoracentesis but she had recurrent effusion with  fever, pain and atelectasis of the left lower lung.  A CT scan showed  evidence of entrapment and a decortication was recommended as definitive  therapy.  I discussed the indications, benefits and alternatives to  surgical therapy with the patient.  I reviewed the major risks including  risks of bleeding, infection, air leak, and recurrent hemothorax. After  reviewing these issues, she demonstrated her understanding and agreed to  proceed with the operation as planned under what I felt was an informed  consent.   DESCRIPTION OF PROCEDURE:  The patient was brought to operating room,  placed supine on the operating table where general anesthesia was  induced.  A double-lumen endotracheal tube was placed by the  anesthesiologist and the patient was turned to expose the left chest.  The left chest was prepped and draped as a sterile field.  Two VATS  portal incisions were made in the mid axillary line and the anterior  axillary line.  The  camera was inserted however, there was large amounts  of scarring in the lower pleural space and visualization was poor and  decortication cannot be performed with the VATS instruments alone.  For  that reason, a mini thoracotomy was made in the fifth interspace  measuring approximately 5 cm.  The ribs were gently spread but not  divided.  There was a thick gelatinous material in the cardiophrenic  angle.  There was a large amount of more organized and fibrotic scar  tissue between the lower lobe and the diaphragm and a peel extending up  on the lower lobe.  All of this material including the gelatinous  protein and the more fibrotic scar tissue was gently peeled off the  lower lobe in the diaphragm and the adhesions were removed. The inferior  pulmonary ligament was taken down to mobilize the left lower lobe.  After all of this had been accomplished and pathology and cultures of  the material were taken, the lungs were expanded and filled the space  well.  Two chest tubes were placed in the  anterior and posterior aspect  of the left hemithorax and brought out through separate incisions.  The  mini thoracotomy was then closed in layers using interrupted Vicryl and  a subcuticular suture for the skin.  The VATS portal incisions were  closed interrupted Vicryl  and subcutaneous Vicryl.  An On-Q irrigation system was place with two  catheters above and below the mini thoracotomy incision and connected to  the reservoir containing the Marcaine.  The patient was then reversed  from anesthesia, extubated and returned to the recovery room in stable  condition.      Kerin Perna, M.D.  Electronically Signed     PV/MEDQ  D:  01/12/2007  T:  01/13/2007  Job:  [id number]

## 2010-07-23 NOTE — Assessment & Plan Note (Signed)
OFFICE VISIT   [name], [name]  DOB:  08-22-1950                                        February 25, 2008  CHART #:  [id number]   CURRENT PROBLEMS:  1. Status post left thoracotomy with decortication of a loculated      hemothorax November 2008.  2. Left-sided post-thoracotomy pain followed [facility] Dr[facility] Claudette [facility]      [facility] with intermittent nerve block.   PRESENT ILLNESS:  The patient returns for final 1-year followup after  her left VATS, decortication and drainage of a traumatic hemothorax.  Her pain is better.  She is able to sleep at night now.  She has  completed 2 nerve block treatments by Dr. Wynn Banker.  She brings with  her a CT scan performed approximately a week ago, which shows completely  resolved inflammatory process in the left lung without residual  effusion, atelectasis, or significant pleural thickening.   PHYSICAL EXAMINATION:  VITAL SIGNS:  Oxygen saturation 98%.  Blood  pressure 130/90 and pulse 90.  LUNGS:  Breath sounds are clear and equal.  CHEST:  The thoracotomy incision is well healed.   IMPRESSION AND PLAN:  The patient is now over 1 year postop and will  return to the care of her primary care physician, Dr. Laurann Montana.  She will continue to see Dr. Wynn [facility]er at [facility] as needed.  She will return here as needed.   Kerin Perna, M.D.  Electronically Signed   PV/MEDQ  D:  02/25/2008  T:  02/26/2008  Job:  [id number]

## 2010-07-23 NOTE — Assessment & Plan Note (Signed)
OFFICE VISIT   [name], [name]  DOB:  [birth year]                                        February 26, 2007  CHART #:  [id number]   HISTORY OF PRESENT ILLNESS:  [name] is a 60 year old female status  post traumatic hemothorax requiring left video-assisted thoracoscopy  with decortication by Dr. Donata Clay on January 12, 2007.  She presents  on today's date for her second postoperative visit.  She continues to  show good clinical progress.  She does describe some minor discomforts  associated with the region of the surgery, which she describes primarily  as being irritated or inflamed.  This description pertains to inside the  chest, around the ribs.  She is returning to normal daily function.  She  denies significant shortness of breath.  Chest x-ray today reveals some  steady improvement, but there continues to be some evidence of left-  sided atelectasis and inflammation.  Overall, it does appear, however,  to be improved.   PHYSICAL EXAMINATION:  VITAL SIGNS:  Blood pressure is 169/96, pulse 94,  respirations 18, oxygen saturation of 98% on room air.  GENERAL:  Well-developed, middle-aged female in no acute distress.  PULMONARY:  Clear breath sounds, except slightly diminished in the left  base.  CARDIAC:  Examination regular rate and rhythm.  Incision appears to be  healing well without evidence of infection.   ASSESSMENT:  [name] continues to progress nicely following her  decortication.  She was seen during this visit by Donata Clay, and he  feels as though she can return to work at this time.  She can return to  her normal level of activities as well.  He does wish to see her on the  27th of February with a repeat chest x-ray at that time, to monitor her  progress.   Rowe Clack, P.A.-C.   [name]  D:  02/26/2007  T:  02/27/2007  Job:  [id number]   cc:   Kerin Perna, M.D.

## 2010-07-26 NOTE — Assessment & Plan Note (Signed)
OFFICE VISIT   [name], [name]  DOB:  Oct 19, 1950                                        November 12, 2007  CHART #:  [id number]   CURRENT PROBLEMS:  1. Status post left thoracotomy decortication of a loculated      hemothorax following blunt trauma.  2. Post-thoracotomy pain.   HISTORY OF PRESENT ILLNESS:  The patient returns for followup after  undergoing left thoracotomy decortication in November 2008.  She still  has considerable pain requiring pain medication.  There is no evidence  or symptoms of wound infection and followup chest x-rays have shown no  significant recurrent pleural effusion, just some mild postoperative  changes in pleural thickening.   PHYSICAL EXAMINATION:  VITAL SIGNS:  Stable.  Blood pressure 128/80,  pulse 100, respirations 18, saturation 98%.  GENERAL:  She is distressed, but alert and appropriate.  CHEST:  The thoracotomy incision is well healed.  Breath sounds are  clear.  She has tenderness to palpation along the left inframammary  crease.  She complains of sharp stabbing pain.   Chest x-ray taken today shows no significant problems.  Just some mild  pleural thickening without effusion.   IMPRESSION AND PLAN:  The patient will be referred to the [facility]  (Dr. Wynn Banker).  She is given a prescription for Vicodin to help  control the pain until her appointment.  I will see her back in 2 months  for followup.   Kerin Perna, M.D.  Electronically Signed   PV/MEDQ  D:  11/12/2007  T:  11/13/2007  Job:  [id number]

## 2010-12-17 LAB — URINALYSIS, ROUTINE W REFLEX MICROSCOPIC
Bilirubin Urine: NEGATIVE
Glucose, UA: NEGATIVE
Hgb urine dipstick: NEGATIVE
Ketones, ur: NEGATIVE
Nitrite: NEGATIVE
Protein, ur: NEGATIVE
Specific Gravity, Urine: 1.013
Urobilinogen, UA: 0.2
pH: 7.5

## 2010-12-17 LAB — CBC
HCT: 29.4 — ABNORMAL LOW
HCT: 30.5 — ABNORMAL LOW
HCT: 30.6 — ABNORMAL LOW
Hemoglobin: 10.2 — ABNORMAL LOW
Hemoglobin: 10.3 — ABNORMAL LOW
Hemoglobin: 9.8 — ABNORMAL LOW
MCHC: 33.3
MCHC: 33.4
MCHC: 33.8
MCV: 86.9
MCV: 88.3
MCV: 88.8
Platelets: 392
Platelets: 484 — ABNORMAL HIGH
Platelets: 530 — ABNORMAL HIGH
RBC: 3.32 — ABNORMAL LOW
RBC: 3.45 — ABNORMAL LOW
RBC: 3.52 — ABNORMAL LOW
RDW: 14.4 — ABNORMAL HIGH
RDW: 14.5 — ABNORMAL HIGH
RDW: 14.5 — ABNORMAL HIGH
WBC: 4.1
WBC: 6.5
WBC: 6.9

## 2010-12-17 LAB — COMPREHENSIVE METABOLIC PANEL
ALT: 47 — ABNORMAL HIGH
AST: 26
Albumin: 2.2 — ABNORMAL LOW
Alkaline Phosphatase: 127 — ABNORMAL HIGH
BUN: 2 — ABNORMAL LOW
CO2: 27
Calcium: 8.3 — ABNORMAL LOW
Chloride: 103
Creatinine, Ser: 0.81
GFR calc Af Amer: >60
GFR calc non Af Amer: >60
Glucose, Bld: 109 — ABNORMAL HIGH
Potassium: 3.3 — ABNORMAL LOW
Sodium: 139
Total Bilirubin: 0.1 — ABNORMAL LOW
Total Protein: 5.4 — ABNORMAL LOW

## 2010-12-17 LAB — BLOOD GAS, ARTERIAL
Acid-base deficit: 1.2
Bicarbonate: 22.6
O2 Content: 2
O2 Saturation: 98.5
Patient temperature: 98.6
TCO2: 23.7
pCO2 arterial: 35
pH, Arterial: 7.426 — ABNORMAL HIGH
pO2, Arterial: 110 — ABNORMAL HIGH

## 2010-12-17 LAB — BASIC METABOLIC PANEL
BUN: 3 — ABNORMAL LOW
BUN: 6
BUN: 9
CO2: 25
CO2: 25
CO2: 28
Calcium: 8.1 — ABNORMAL LOW
Calcium: 8.3 — ABNORMAL LOW
Calcium: 8.4
Chloride: 102
Chloride: 107
Chloride: 108
Creatinine, Ser: 0.81
Creatinine, Ser: 0.84
Creatinine, Ser: 1.04
GFR calc Af Amer: >60
GFR calc Af Amer: >60
GFR calc Af Amer: >60
GFR calc non Af Amer: 55 — ABNORMAL LOW
GFR calc non Af Amer: >60
GFR calc non Af Amer: >60
Glucose, Bld: 144 — ABNORMAL HIGH
Glucose, Bld: 96
Glucose, Bld: 96
Potassium: 3.6
Potassium: 3.8
Potassium: 4.1
Sodium: 137
Sodium: 139
Sodium: 141

## 2010-12-17 LAB — TYPE AND SCREEN
ABO/RH(D): O POS
Antibody Screen: NEGATIVE

## 2010-12-17 LAB — BODY FLUID CULTURE
Culture: NO GROWTH
Gram Stain: NONE SEEN

## 2010-12-17 LAB — ANAEROBIC CULTURE: Gram Stain: NONE SEEN

## 2010-12-17 LAB — ABO/RH: ABO/RH(D): O POS

## 2010-12-18 LAB — DIFFERENTIAL
Basophils Absolute: 0.1
Basophils Relative: 1
Eosinophils Absolute: 0.1
Eosinophils Relative: 2
Lymphocytes Relative: 20
Lymphs Abs: 2
Monocytes Absolute: 0.6
Monocytes Relative: 6
Neutro Abs: 6.9
Neutrophils Relative %: 72

## 2010-12-18 LAB — COMPREHENSIVE METABOLIC PANEL
ALT: 40 — ABNORMAL HIGH
AST: 24
Albumin: 3.2 — ABNORMAL LOW
Alkaline Phosphatase: 97
BUN: 15
CO2: 23
Calcium: 8.4
Chloride: 104
Creatinine, Ser: 1.1
GFR calc Af Amer: >60
GFR calc non Af Amer: 52 — ABNORMAL LOW
Glucose, Bld: 111 — ABNORMAL HIGH
Potassium: 3.3 — ABNORMAL LOW
Sodium: 138
Total Bilirubin: 0.6
Total Protein: 6.1

## 2010-12-18 LAB — I-STAT 8, (EC8 V) (CONVERTED LAB)
Acid-base deficit: 1
BUN: 17
Bicarbonate: 24.7 — ABNORMAL HIGH
Chloride: 105
Glucose, Bld: 126 — ABNORMAL HIGH
HCT: 42
Hemoglobin: 14.3
Operator id: 234501
Potassium: 3.4 — ABNORMAL LOW
Sodium: 139
TCO2: 26
pCO2, Ven: 42.1 — ABNORMAL LOW
pH, Ven: 7.376 — ABNORMAL HIGH

## 2010-12-18 LAB — BODY FLUID CULTURE: Culture: NO GROWTH

## 2010-12-18 LAB — CULTURE, BLOOD (ROUTINE X 2)
Culture: NO GROWTH
Culture: NO GROWTH

## 2010-12-18 LAB — CBC
HCT: 39.2
Hemoglobin: 13.1
MCHC: 33.3
MCV: 90.1
Platelets: 307
RBC: 4.35
RDW: 14.6 — ABNORMAL HIGH
WBC: 9.7

## 2010-12-18 LAB — POCT I-STAT CREATININE
Creatinine, Ser: 1.4 — ABNORMAL HIGH
Operator id: 234501

## 2010-12-18 LAB — PROTIME-INR
INR: 1
Prothrombin Time: 13.6

## 2010-12-18 LAB — MAGNESIUM: Magnesium: 1.9

## 2010-12-18 LAB — PHOSPHORUS: Phosphorus: 3.5

## 2010-12-18 LAB — APTT: aPTT: 44 — ABNORMAL HIGH

## 2010-12-19 LAB — BODY FLUID CELL COUNT WITH DIFFERENTIAL
Eos, Fluid: 3
Lymphs, Fluid: 29
Monocyte-Macrophage-Serous Fluid: 48 — ABNORMAL LOW
Neutrophil Count, Fluid: 20
Total Nucleated Cell Count, Fluid: 2480 — ABNORMAL HIGH

## 2010-12-19 LAB — BODY FLUID CULTURE: Culture: NO GROWTH

## 2010-12-19 LAB — LIPASE, FLUID: Lipase-Fluid: <10

## 2010-12-19 LAB — GLUCOSE, SEROUS FLUID: Glucose, Fluid: 77

## 2010-12-19 LAB — AMYLASE, BODY FLUID: Amylase, Fluid: 60

## 2010-12-19 LAB — PROTEIN, BODY FLUID: Total protein, fluid: 3.6

## 2010-12-19 LAB — LACTATE DEHYDROGENASE, PLEURAL OR PERITONEAL FLUID: LD, Fluid: 742 — ABNORMAL HIGH

## 2010-12-19 LAB — PATHOLOGIST SMEAR REVIEW

## 2011-01-14 ENCOUNTER — Encounter: Payer: Worker's Compensation | Attending: Physical Medicine & Rehabilitation

## 2011-01-14 ENCOUNTER — Ambulatory Visit: Payer: Worker's Compensation | Admitting: Physical Medicine & Rehabilitation

## 2011-01-14 DIAGNOSIS — G8928 Other chronic postprocedural pain: Secondary | ICD-10-CM

## 2011-01-14 DIAGNOSIS — G548 Other nerve root and plexus disorders: Secondary | ICD-10-CM | POA: Insufficient documentation

## 2011-01-14 DIAGNOSIS — G8921 Chronic pain due to trauma: Secondary | ICD-10-CM | POA: Insufficient documentation

## 2011-01-14 NOTE — Assessment & Plan Note (Signed)
ACCOUNT:  [id number].  This is a patient of Dr. Wynn Banker, who sustained a left rib fracture, resulting in a pleural effusion.  She had chest tubes and was in intensive care for some time, a few years ago.  She has done well since that time.  She rates her pain today at 8 or 10.  She states it is unchanged.  It is sharp, burning, dull, stabbing, and aching type pain. General activity level is 9.  Pain is worse during the day.  Sleep patterns are fair.  Bending, sitting, and activity aggravate.  Rest, heat therapy, and medication help.  She walks without assistance.  She climb steps and drive.  She walks all day at work.  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  REVIEW OF SYSTEMS:  Notable for difficulties as described above as well as some easy bleeding, tendencies, paresthesias, spasms.  No suicidal thoughts or aberrant behaviors.  Pill counts and UDS correct and consistent.  PAST MEDICAL HISTORY:  Unchanged.  SOCIAL HISTORY:  Unchanged.  FAMILY HISTORY:  Unchanged.  PHYSICAL EXAM:  VITAL SIGNS:  Blood pressure is 132/75, pulse 91, respirations 16, O2 sats 100 on room air. NEUROLOGIC:  Her motor strength and sensation are intact.  Even though she does have pain.  Constitutionally, she is within normal limits.  She is alert and oriented x3.  She has normal gait.  ASSESSMENT:  History of intercostal neuralgia with chronic posttraumatic pain.  PLAN: 1. Continue gabapentin 300 mg 1 p.o. b.i.d., #60 with 5 refills. 2. Lidoderm 5% transdermally, on 12 hours, off 12 hours, #60 with 5     refills.  Her questions were encouraged and answered.  We will see     her back in 6 weeks.     Chadd Tollison L. Blima Dessert Electronically Signed    RLW/MedQ D:  01/14/2011 15:02:30  T:  01/14/2011 21:18:54  Job #:  [id number]

## 2011-01-20 ENCOUNTER — Other Ambulatory Visit: Payer: Self-pay | Admitting: Gynecology

## 2011-07-15 ENCOUNTER — Encounter: Payer: Worker's Compensation | Attending: Physical Medicine & Rehabilitation

## 2011-07-15 ENCOUNTER — Ambulatory Visit (HOSPITAL_BASED_OUTPATIENT_CLINIC_OR_DEPARTMENT_OTHER): Payer: Worker's Compensation | Admitting: Physical Medicine & Rehabilitation

## 2011-07-15 ENCOUNTER — Encounter: Payer: Self-pay | Admitting: Physical Medicine & Rehabilitation

## 2011-07-15 VITALS — BP 119/76 | HR 80 | Resp 14 | Ht 67.0 in | Wt 148.0 lb

## 2011-07-15 DIAGNOSIS — G548 Other nerve root and plexus disorders: Secondary | ICD-10-CM

## 2011-07-15 DIAGNOSIS — R52 Pain, unspecified: Secondary | ICD-10-CM | POA: Insufficient documentation

## 2011-07-15 DIAGNOSIS — M62838 Other muscle spasm: Secondary | ICD-10-CM | POA: Insufficient documentation

## 2011-07-15 DIAGNOSIS — IMO0001 Reserved for inherently not codable concepts without codable children: Secondary | ICD-10-CM

## 2011-07-15 DIAGNOSIS — G588 Other specified mononeuropathies: Secondary | ICD-10-CM | POA: Insufficient documentation

## 2011-07-15 DIAGNOSIS — M7918 Myalgia, other site: Secondary | ICD-10-CM

## 2011-07-15 NOTE — Patient Instructions (Addendum)
Please do your postural exercises.Myofascial Pain Syndrome Myofascial pain syndrome is a pain disorder. This pain may be felt in the muscles. It may come and go. Myofascial pain syndrome always has trigger or tender points in the muscle that will cause pain when pressed.  CAUSES Myofascial pain may be caused by injuries, especially auto accidents, or by overuse of certain muscles. Typically the pain is long lasting. It is made worse by overuse of the involved muscles, emotional distress, and by cold, damp weather. Myofascial pain syndrome often develops in patients whose response to stress is an increase in muscle tone, and is seen in greater frequency in patients with pre-existing tension headaches. SYMPTOMS  Myofascial pain syndrome causes a wide variety of symptoms. You may see tight ropy bands of muscle. Problems may also include aching, cramping, burning, numbness, tingling, and other uncomfortable sensations in muscular areas. It most commonly affects the neck, upper back, and shoulder areas. Pain often radiates into the arms and hands.  TREATMENT Treatment includes resting the affected muscular area and applying ice packs to reduce spasm and pain. Trigger point injection, is a valuable initial therapy. This therapy is an injection of local anesthetic directly into the trigger point. Trigger points are often present at the source of pain. Pain relief following injection confirms the diagnosis of myofascial pain syndrome. Fairly vigorous therapy can be carried out during the pain-free period after each injection. Stretching exercises to loosen up the muscles are also useful. Transcutaneous electrical nerve stimulation (TENS) may provide relief from pain. TENS is the use of electric current produced by a device to stimulate the nerves. Ultrasound therapy applied directly over the affected muscle may also provide pain relief. Anti-inflammatory pain medicine can be helpful. Symptoms will gradually improve  over a period of weeks to months with proper treatment. HOME CARE INSTRUCTIONS Call your caregiver for follow-up care as recommended.  SEEK MEDICAL CARE IF:  Your pain is severe and not helped with medications. Document Released: 04/03/2004 Document Revised: 02/13/2011 Document Reviewed: 04/12/2010 [facility] Patient Information 2012 [place], [place].

## 2011-07-15 NOTE — Progress Notes (Signed)
  Subjective:    Patient ID: [name], female    DOB: 02/23/1951, 61 y.o.   MRN: [id number]  HPI New blood pressure medications otherwise no new medical issues. L upper back pain with prolonged computer use Pain Inventory Average Pain 8 Pain Right Now 10 My pain is sharp, stabbing and aching  In the last 24 hours, has pain interfered with the following? General activity 9 Relation with others 8 Enjoyment of life 10 What TIME of day is your pain at its worst? night Sleep (in general) Poor  Pain is worse with: sitting and some activites Pain improves with: rest, heat/ice and medication Relief from Meds: 5  Mobility walk without assistance how many minutes can you walk? walks daily ability to climb steps?  yes do you drive?  yes Do you have any goals in this area?  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  no  Physicians involved in your care Any changes since last visit?  no  Review of Systems  Constitutional: Negative.   HENT: Negative.   Eyes: Negative.   Respiratory: Negative.   Cardiovascular: Negative.   Gastrointestinal: Negative.   Genitourinary: Negative.   Musculoskeletal: Negative.   Skin: Negative.   Neurological: Negative.   Hematological: Negative.   Psychiatric/Behavioral: Positive for dysphoric mood.       Objective:   Physical Exam  Constitutional: She is oriented to person, place, and time.  Musculoskeletal: Normal range of motion.  Neurological: She is alert and oriented to person, place, and time. She has normal strength. Gait normal.    Tenderness over L upper trap and upper back       Assessment & Plan:  1.  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 Myofascial pain instructed proper posture

## 2011-09-04 ENCOUNTER — Other Ambulatory Visit: Payer: Self-pay | Admitting: *Deleted

## 2011-09-04 MED ORDER — GABAPENTIN 300 MG PO CAPS: 300.0000 mg | ORAL_CAPSULE | Freq: Two times a day (BID) | ORAL | Status: DC

## 2011-09-04 MED ORDER — LIDOCAINE 5 % EX PTCH: 2.0000 | MEDICATED_PATCH | Freq: Two times a day (BID) | CUTANEOUS | Status: DC

## 2011-12-02 ENCOUNTER — Other Ambulatory Visit: Payer: Self-pay | Admitting: Physical Medicine & Rehabilitation

## 2011-12-05 ENCOUNTER — Other Ambulatory Visit: Payer: Self-pay | Admitting: Physical Medicine & Rehabilitation

## 2012-01-12 ENCOUNTER — Ambulatory Visit (HOSPITAL_BASED_OUTPATIENT_CLINIC_OR_DEPARTMENT_OTHER): Payer: Worker's Compensation | Admitting: Physical Medicine & Rehabilitation

## 2012-01-12 ENCOUNTER — Encounter: Payer: Self-pay | Admitting: Physical Medicine & Rehabilitation

## 2012-01-12 ENCOUNTER — Encounter: Payer: Worker's Compensation | Attending: Physical Medicine & Rehabilitation

## 2012-01-12 VITALS — BP 137/73 | HR 96 | Resp 14 | Ht 66.0 in | Wt 154.2 lb

## 2012-01-12 DIAGNOSIS — IMO0001 Reserved for inherently not codable concepts without codable children: Secondary | ICD-10-CM | POA: Insufficient documentation

## 2012-01-12 DIAGNOSIS — G548 Other nerve root and plexus disorders: Secondary | ICD-10-CM | POA: Insufficient documentation

## 2012-01-12 DIAGNOSIS — M7918 Myalgia, other site: Secondary | ICD-10-CM

## 2012-01-12 DIAGNOSIS — G588 Other specified mononeuropathies: Secondary | ICD-10-CM

## 2012-01-12 NOTE — Patient Instructions (Signed)
Next visit will be for trigger point injection of the left trapezius muscle

## 2012-01-12 NOTE — Progress Notes (Signed)
  Subjective:    Patient ID: [name], female    DOB: September 05, 1950, 61 y.o.   MRN: [id number]  HPI Upper back myofascial pain about the same. Getting some tips from physical therapist at work Continues to have the intercostal neuralgia pain is well Pain Inventory Average Pain 9 Pain Right Now 9 My pain is sharp, stabbing and aching  In the last 24 hours, has pain interfered with the following? General activity 9 Relation with others 7 Enjoyment of life 9 What TIME of day is your pain at its worst? morning and evening Sleep (in general) Fair  Pain is worse with: bending, sitting and some activites Pain improves with: rest, heat/ice and medication Relief from Meds: 5  Mobility ability to climb steps?  yes do you drive?  yes  Function employed # of hrs/week 50-60  Neuro/Psych spasms  Prior Studies Any changes since last visit?  no  Physicians involved in your care Any changes since last visit?  no   Family History  Problem Relation Age of Onset  . Cancer Mother   . Alzheimer's disease Mother    History   Social History  . Marital Status: Married    Spouse Name: N/A    Number of Children: N/A  . Years of Education: N/A   Social History Main Topics  . Smoking status: Current Every Day Smoker  . Smokeless tobacco: Never Used  . Alcohol Use: No  . Drug Use: None  . Sexually Active: None   Other Topics Concern  . None   Social History Narrative  . None   Past Surgical History  Procedure Date  . Abdominal hysterectomy   . Knee surgery   . Carpal tunnel release   . Elbow surgery   . Lung surgery   . Bladder repair    Past Medical History  Diagnosis Date  . Hyperlipidemia   . Chronic kidney disease    BP 137/73  Pulse 96  Resp 14  Ht 5\' 6"  (1.676 m)  Wt 154 lb 3.2 oz (69.945 kg)  BMI 24.89 kg/m2  SpO2 100%    Review of Systems  Musculoskeletal:       Spasms and rib pain  All other systems reviewed and are negative.         Objective:   Physical Exam  Tenderness to palpation over the left upper PCS Tenderness palpation along the T8 T9-T10 ribs posterior lateral aspect Ambulation is normal General no acute distress Mood and affect are appropriate      Assessment & Plan:  1.  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 Myofascial pain instructed proper posture,Continues to have pain. This may be an indirect result of her work related injury. Will schedule for trigger point injection

## 2012-02-04 DIAGNOSIS — IMO0002 Reserved for concepts with insufficient information to code with codable children: Secondary | ICD-10-CM | POA: Insufficient documentation

## 2012-02-04 DIAGNOSIS — G43019 Migraine without aura, intractable, without status migrainosus: Secondary | ICD-10-CM | POA: Insufficient documentation

## 2012-02-07 ENCOUNTER — Ambulatory Visit (INDEPENDENT_AMBULATORY_CARE_PROVIDER_SITE_OTHER): Payer: PRIVATE HEALTH INSURANCE | Admitting: Emergency Medicine

## 2012-02-07 ENCOUNTER — Encounter: Payer: Self-pay | Admitting: Emergency Medicine

## 2012-02-07 ENCOUNTER — Ambulatory Visit: Payer: PRIVATE HEALTH INSURANCE

## 2012-02-07 VITALS — BP 123/78 | HR 96 | Temp 98.1°F | Resp 18 | Ht 65.5 in | Wt 153.0 lb

## 2012-02-07 DIAGNOSIS — J209 Acute bronchitis, unspecified: Secondary | ICD-10-CM

## 2012-02-07 DIAGNOSIS — R05 Cough: Secondary | ICD-10-CM

## 2012-02-07 DIAGNOSIS — J018 Other acute sinusitis: Secondary | ICD-10-CM

## 2012-02-07 DIAGNOSIS — R059 Cough, unspecified: Secondary | ICD-10-CM

## 2012-02-07 DIAGNOSIS — J0100 Acute maxillary sinusitis, unspecified: Secondary | ICD-10-CM

## 2012-02-07 MED ORDER — AMOXICILLIN-POT CLAVULANATE 875-125 MG PO TABS: 1.0000 | ORAL_TABLET | Freq: Two times a day (BID) | ORAL | Status: DC

## 2012-02-07 MED ORDER — HYDROCOD POLST-CHLORPHEN POLST 10-8 MG/5ML PO LQCR: 5.0000 mL | Freq: Two times a day (BID) | ORAL | Status: DC | PRN

## 2012-02-07 MED ORDER — PSEUDOEPHEDRINE-GUAIFENESIN ER 60-600 MG PO TB12: 1.0000 | ORAL_TABLET | Freq: Two times a day (BID) | ORAL | Status: DC

## 2012-02-07 NOTE — Progress Notes (Signed)
[facility] [address] [place] [address] [phone]  Date:  02/07/2012   Name:  [name]   DOB:  11-30-1950   MRN:  [id number]  PCP:  No primary provider on file.    Chief Complaint: Cough and Nasal Congestion   History of Present Illness:  [name] is a 61 y.o. very pleasant female patient who presents with the following:  6 weeks duration of cough.  Nasal congestion and post nasal drainage with a green nasal discharge.  Has a purulent cough.  No fever or chills.  No improvement with OTC medication.  No wheezing or shortness of breath.  No nausea or vomiting, headache.  Has pressure in cheeks.  Cough worse at night when she lays down.  Patient Active Problem List  Diagnosis  . Intercostal neuralgia  . Myofascial muscle pain    Past Medical History  Diagnosis Date  . Hyperlipidemia   . Chronic kidney disease     Past Surgical History  Procedure Date  . Abdominal hysterectomy   . Knee surgery   . Carpal tunnel release   . Elbow surgery   . Lung surgery   . Bladder repair     History  Substance Use Topics  . Smoking status: Current Every Day Smoker -- 0.3 packs/day    Types: Cigarettes  . Smokeless tobacco: Never Used  . Alcohol Use: No    Family History  Problem Relation Age of Onset  . Cancer Mother   . Alzheimer's disease Mother     No Known Allergies  Medication list has been reviewed and updated.  Current Outpatient Prescriptions on File Prior to Visit  Medication Sig Dispense Refill  . clidinium-chlordiazePOXIDE (LIBRAX) 2.5-5 MG per capsule Take 1 capsule by mouth 3 (three) times daily as needed.      . doxepin (SINEQUAN) 100 MG capsule Take 100 mg by mouth at bedtime.      Marland Kitchen esomeprazole (NEXIUM) 40 MG capsule Take 40 mg by mouth daily before breakfast.      . furosemide (LASIX) 20 MG tablet Take 20 mg by mouth 2 (two) times daily.      Marland Kitchen gabapentin (NEURONTIN) 300 MG capsule TAKE 1 CAPSULE (300 MG  TOTAL) BY MOUTH 2 (TWO) TIMES DAILY.  60 capsule  2  . LIDODERM 5 % PLACE 2 PATCHES ONTO THE SKIN EVERY 12HRS.REMOVE & DISCARD PATCH WITHIN 12HRS OR AS DIRECTED  60 patch  2  . ramipril (ALTACE) 5 MG tablet Take 5 mg by mouth daily.      Marland Kitchen topiramate (TOPAMAX) 100 MG tablet Take 100 mg by mouth 2 (two) times daily.      Marland Kitchen estradiol (VIVELLE-DOT) 0.025 MG/24HR Place 1 patch onto the skin daily.        Review of Systems:  As per HPI, otherwise negative.    Physical Examination: Filed Vitals:   02/07/12 1358  BP: 123/78  Pulse: 96  Temp: 98.1 F (36.7 C)  Resp: 18   Filed Vitals:   02/07/12 1358  Height: 5' 5.5" (1.664 m)  Weight: 153 lb (69.4 kg)   Body mass index is 25.07 kg/(m^2). Ideal Body Weight: Weight in (lb) to have BMI = 25: 152.2   GEN: WDWN, NAD, Non-toxic, A & O x 3.  No rash or shortness of breath HEENT: Atraumatic, Normocephalic. Neck supple. No masses, No LAD.  Oropharynx negative Ears and Nose: No external deformity.  TM negative CV: RRR,  No M/G/R. No JVD. No thrill. No extra heart sounds. PULM: CTA B, no wheezes, crackles, rhonchi. No retractions. No resp. distress. No accessory muscle use. ABD: S, NT, ND, +BS. No rebound. No HSM. EXTR: No c/c/e NEURO Normal gait.  PSYCH: Normally interactive. Conversant. Not depressed or anxious appearing.  Calm demeanor.    Assessment and Plan: Sinusitis Bronchitis augmentin mucinex tussionex Follow up as needed  Carmelina Dane, MD  [facility] reading (PRIMARY) by  Dr. Dareen Piano.  Negative chest.

## 2012-02-10 ENCOUNTER — Other Ambulatory Visit: Payer: Self-pay | Admitting: Gynecology

## 2012-02-13 ENCOUNTER — Ambulatory Visit: Payer: Worker's Compensation | Admitting: Physical Medicine & Rehabilitation

## 2012-02-16 NOTE — Progress Notes (Signed)
Reviewed and agree.

## 2012-03-05 ENCOUNTER — Other Ambulatory Visit: Payer: Self-pay | Admitting: Physical Medicine & Rehabilitation

## 2012-03-06 ENCOUNTER — Other Ambulatory Visit: Payer: Self-pay | Admitting: Physical Medicine & Rehabilitation

## 2012-04-05 ENCOUNTER — Other Ambulatory Visit: Payer: Self-pay | Admitting: Physical Medicine & Rehabilitation

## 2012-05-04 ENCOUNTER — Ambulatory Visit: Payer: PRIVATE HEALTH INSURANCE | Attending: Neurology | Admitting: Physical Therapy

## 2012-05-09 ENCOUNTER — Other Ambulatory Visit: Payer: Self-pay | Admitting: Physical Medicine & Rehabilitation

## 2012-06-09 ENCOUNTER — Other Ambulatory Visit: Payer: Self-pay | Admitting: Physical Medicine & Rehabilitation

## 2012-06-21 ENCOUNTER — Encounter: Payer: Self-pay | Admitting: Physical Medicine & Rehabilitation

## 2012-06-21 ENCOUNTER — Ambulatory Visit (HOSPITAL_BASED_OUTPATIENT_CLINIC_OR_DEPARTMENT_OTHER): Payer: Worker's Compensation | Admitting: Physical Medicine & Rehabilitation

## 2012-06-21 ENCOUNTER — Encounter: Payer: Worker's Compensation | Attending: Physical Medicine & Rehabilitation

## 2012-06-21 VITALS — BP 131/81 | HR 87 | Resp 14 | Ht 65.5 in | Wt 157.6 lb

## 2012-06-21 DIAGNOSIS — G548 Other nerve root and plexus disorders: Secondary | ICD-10-CM | POA: Insufficient documentation

## 2012-06-21 DIAGNOSIS — N189 Chronic kidney disease, unspecified: Secondary | ICD-10-CM | POA: Insufficient documentation

## 2012-06-21 DIAGNOSIS — F17200 Nicotine dependence, unspecified, uncomplicated: Secondary | ICD-10-CM | POA: Insufficient documentation

## 2012-06-21 DIAGNOSIS — E785 Hyperlipidemia, unspecified: Secondary | ICD-10-CM | POA: Insufficient documentation

## 2012-06-21 DIAGNOSIS — G588 Other specified mononeuropathies: Secondary | ICD-10-CM

## 2012-06-21 MED ORDER — GABAPENTIN 300 MG PO CAPS: 300.0000 mg | ORAL_CAPSULE | Freq: Two times a day (BID) | ORAL | Status: DC

## 2012-06-21 NOTE — Patient Instructions (Signed)
Will see back in 9 months. Call sooner for appointment if you have increased pain

## 2012-06-21 NOTE — Progress Notes (Signed)
  Subjective:    Patient ID: [name], female    DOB: December 23, 1950, 62 y.o.   MRN: [id number]  HPI Myofascial pain improves after exercise. Sometimes patient uses Lidoderm over the upper back area. She mainly uses it in the left posterior rib cage. No falls. No new work issues. No new medical issues. Pain Inventory Average Pain 8 Pain Right Now 8 My pain is burning, stabbing and aching  In the last 24 hours, has pain interfered with the following? General activity 8 Relation with others 8 Enjoyment of life 8 What TIME of day is your pain at its worst? daytime and evening Sleep (in general) Poor  Pain is worse with: bending, sitting and some activites Pain improves with: medication Relief from Meds: 5  Mobility walk without assistance how many minutes can you walk? 60  Function employed # of hrs/week 50-60  Neuro/Psych No problems in this area  Prior Studies Any changes since last visit?  no  Physicians involved in your care Any changes since last visit?  no   Family History  Problem Relation Age of Onset  . Cancer Mother   . Alzheimer's disease Mother    History   Social History  . Marital Status: Married    Spouse Name: N/A    Number of Children: N/A  . Years of Education: N/A   Social History Main Topics  . Smoking status: Current Every Day Smoker -- 0.30 packs/day    Types: Cigarettes  . Smokeless tobacco: Never Used  . Alcohol Use: No  . Drug Use: No  . Sexually Active: Yes -- Female partner(s)     Comment: 1 sexual partner in last 12 months: married   Other Topics Concern  . None   Social History Narrative  . None   Past Surgical History  Procedure Laterality Date  . Abdominal hysterectomy    . Knee surgery    . Carpal tunnel release    . Elbow surgery    . Lung surgery    . Bladder repair     Past Medical History  Diagnosis Date  . Hyperlipidemia   . Chronic kidney disease    BP 131/81  Pulse 87  Resp 14  Ht 5' 5.5" (1.664  m)  Wt 157 lb 9.6 oz (71.487 kg)  BMI 25.82 kg/m2  SpO2 99%    Review of Systems  Musculoskeletal:       Rib pain  All other systems reviewed and are negative.       Objective:   Physical Exam  Tenderness to palpation over the left upper PCS  Tenderness palpation along the T8 T9-T10 ribs posterior lateral aspect  Ambulation is normal  General no acute distress  Mood and affect are appropriate       Assessment & Plan:  1. Intercostal neuralgia after work injury. She is taking chronic nonnarcotic medications including gabapentin 300 milligrams 3 times per day and Lidoderm patch 2 patches on 12 off 12

## 2012-08-03 ENCOUNTER — Ambulatory Visit (INDEPENDENT_AMBULATORY_CARE_PROVIDER_SITE_OTHER): Payer: PRIVATE HEALTH INSURANCE | Admitting: Neurology

## 2012-08-03 ENCOUNTER — Encounter: Payer: Self-pay | Admitting: Neurology

## 2012-08-03 VITALS — BP 119/78 | HR 92 | Wt 162.0 lb

## 2012-08-03 DIAGNOSIS — IMO0002 Reserved for concepts with insufficient information to code with codable children: Secondary | ICD-10-CM

## 2012-08-03 DIAGNOSIS — G43019 Migraine without aura, intractable, without status migrainosus: Secondary | ICD-10-CM

## 2012-08-03 MED ORDER — DOXEPIN HCL 100 MG PO CAPS: 200.0000 mg | ORAL_CAPSULE | Freq: Every day | ORAL | Status: DC

## 2012-08-03 NOTE — Progress Notes (Signed)
Reason for visit: Headache  RAELEE [name] is an 62 y.o. female  History of present illness:  [name] is a 62 year old right-handed white female with a history of left occipital neuralgia, left shoulder pain. The patient has done much better with physical therapy, and she uses ice and heat and stretching on a regular basis. The patient has done well with her headaches, and she has not had any headache pain since she was seen here last in November 2013. The patient is on gabapentin taking 400 mg tablets, 2 tablets at night. The patient is on doxepin as well taking 100 mg at night, 200 mg at night on the weekends. The patient is not sleeping well, usually only about 4 hours at night. The patient wakes up with neck discomfort. The patient overall feels as if she is doing much better. The patient returns for an evaluation. The patient is also on Topamax taking 100 mg at night.  Past Medical History  Diagnosis Date  . Hyperlipidemia   . Chronic kidney disease   . Migraine headache   . GERD (gastroesophageal reflux disease)   . Occipital neuralgia     Left    Past Surgical History  Procedure Laterality Date  . Abdominal hysterectomy    . Knee surgery    . Carpal tunnel release    . Elbow surgery    . Lung surgery    . Bladder repair    . Cervical spine surgery      Family History  Problem Relation Age of Onset  . Cancer Mother   . Alzheimer's disease Mother     Social history:  reports that she has been smoking Cigarettes.  She has been smoking about 0.30 packs per day. She has never used smokeless tobacco. She reports that she does not drink alcohol or use illicit drugs.  Allergies:  Allergies  Allergen Reactions  . Shellfish Allergy     All seafood    Medications:  Current Outpatient Prescriptions on File Prior to Visit  Medication Sig Dispense Refill  . clidinium-chlordiazePOXIDE (LIBRAX) 2.5-5 MG per capsule Take 1 capsule by mouth 3 (three) times daily as  needed.      Marland Kitchen esomeprazole (NEXIUM) 40 MG capsule Take 40 mg by mouth daily before breakfast.      . estradiol (VIVELLE-DOT) 0.025 MG/24HR Place 1 patch onto the skin daily.      . furosemide (LASIX) 20 MG tablet Take 20 mg by mouth daily.       Marland Kitchen lidocaine (LIDODERM) 5 % PLACE 2 PATCHES ONTO THE SKIN EVERY 12HRS.REMOVE & DISCARD PATCH WITHIN 12HRS OR AS DIRECTED  60 patch  2  . ramipril (ALTACE) 5 MG tablet Take 5 mg by mouth daily.      Marland Kitchen topiramate (TOPAMAX) 100 MG tablet Take 100 mg by mouth at bedtime.        No current facility-administered medications on file prior to visit.    ROS:  Out of a complete 14 system review of symptoms, the patient complains only of the following symptoms, and all other reviewed systems are negative.  Fatigue Joint pain, muscle cramps Insomnia  Blood pressure 119/78, pulse 92, weight 162 lb (73.483 kg).  Physical Exam  General: The patient is alert and cooperative at the time of the examination.  Neuromuscular: The patient has fair range movement of the cervical spine.   Skin: No significant peripheral edema is noted.   Neurologic Exam  Cranial nerves: Facial symmetry  is present. Speech is normal, no aphasia or dysarthria is noted. Extraocular movements are full. Visual fields are full.  Motor: The patient has good strength in all 4 extremities.  Coordination: The patient has good finger-nose-finger and heel-to-shin bilaterally.  Gait and station: The patient has a normal gait. Tandem gait is normal. Romberg is negative. No drift is seen.  Reflexes: Deep tendon reflexes are symmetric.   Assessment/Plan:  One. Migraine headache  2. Cervical spondylosis, left shoulder pain  3. Left occipital neuralgia  Overall, the patient doing much better. The patient will be given a prescription for doxepin, and she will be continued on the Topamax and the gabapentin. The patient will followup through this office in 6 months.  Marlan Palau  MD 08/03/2012 9:33 PM  [facility] [address] [place], [place] [address]  Phone [phone] Fax [phone]

## 2012-10-15 ENCOUNTER — Other Ambulatory Visit: Payer: Self-pay

## 2012-10-15 MED ORDER — LIDOCAINE 5 % EX PTCH: MEDICATED_PATCH | CUTANEOUS | Status: DC

## 2012-10-25 ENCOUNTER — Other Ambulatory Visit: Payer: Self-pay

## 2012-10-25 MED ORDER — GABAPENTIN 300 MG PO CAPS: 300.0000 mg | ORAL_CAPSULE | Freq: Two times a day (BID) | ORAL | Status: DC

## 2012-12-24 ENCOUNTER — Other Ambulatory Visit: Payer: Self-pay | Admitting: *Deleted

## 2012-12-27 ENCOUNTER — Ambulatory Visit (INDEPENDENT_AMBULATORY_CARE_PROVIDER_SITE_OTHER): Payer: PRIVATE HEALTH INSURANCE | Admitting: Emergency Medicine

## 2012-12-27 ENCOUNTER — Ambulatory Visit: Payer: PRIVATE HEALTH INSURANCE

## 2012-12-27 VITALS — BP 122/86 | HR 104 | Temp 98.1°F | Resp 16 | Ht 66.2 in | Wt 168.0 lb

## 2012-12-27 DIAGNOSIS — R059 Cough, unspecified: Secondary | ICD-10-CM

## 2012-12-27 DIAGNOSIS — R05 Cough: Secondary | ICD-10-CM

## 2012-12-27 DIAGNOSIS — J209 Acute bronchitis, unspecified: Secondary | ICD-10-CM

## 2012-12-27 LAB — POCT CBC
Granulocyte percent: 59.8 %G (ref 37–80)
HCT, POC: 43 % (ref 37.7–47.9)
Hemoglobin: 13.5 g/dL (ref 12.2–16.2)
Lymph, poc: 1.6 (ref 0.6–3.4)
MCH, POC: 29.5 pg (ref 27–31.2)
MCHC: 31.4 g/dL — AB (ref 31.8–35.4)
MCV: 94 fL (ref 80–97)
MID (cbc): 0.3 (ref 0–0.9)
MPV: 8.5 fL (ref 0–99.8)
POC Granulocyte: 2.9 (ref 2–6.9)
POC LYMPH PERCENT: 33.5 %L (ref 10–50)
POC MID %: 6.7 %M (ref 0–12)
Platelet Count, POC: 201 10*3/uL (ref 142–424)
RBC: 4.57 M/uL (ref 4.04–5.48)
RDW, POC: 14.2 %
WBC: 4.8 10*3/uL (ref 4.6–10.2)

## 2012-12-27 MED ORDER — CEFDINIR 300 MG PO CAPS: 600.0000 mg | ORAL_CAPSULE | Freq: Every day | ORAL | Status: DC

## 2012-12-27 MED ORDER — ALBUTEROL SULFATE (2.5 MG/3ML) 0.083% IN NEBU
2.5000 mg | INHALATION_SOLUTION | Freq: Once | RESPIRATORY_TRACT | Status: AC
  Administered 2012-12-27: 2.5 mg via RESPIRATORY_TRACT

## 2012-12-27 MED ORDER — HYDROCODONE-HOMATROPINE 5-1.5 MG/5ML PO SYRP: 5.0000 mL | ORAL_SOLUTION | Freq: Three times a day (TID) | ORAL | Status: DC | PRN

## 2012-12-27 NOTE — Progress Notes (Signed)
  Subjective:    Patient ID: [name], female    DOB: [birth year], 62 y.o.   MRN: [id number]  This chart was scribed for Viviann Spare Kaylor Simenson-MD,  by Ladona Ridgel Day, ED scribe. This patient was seen in room Room/bed info not found and the patient's care was started at 805 AM.  HPI HPI Comments: [name] is a 62 y.o. female who presents to the [facility] complaining of constant, gradually worsening, yellow colored productive cough, onset 2 weeks ago. She states began with allergy symptoms and head congestion at this time and 2 days ago she began having flu like symptoms with a fever (max 101 F), wheezing, and chest tightness which occurs while she is coughing. She states has had a pneumonia before and this feels similar to that previous episode. She states taking Nyquil without any relief from her coughing symptoms. She denies any sick contacts. She has a medical hx of stage 2 kidney failure and sees Dr. Maggie Schwalbe.  She quit smoking in August 2014.   Review of Systems A complete 10 system review of systems was obtained and all systems are negative except as noted in the HPI and PMH.      Objective:   Physical Exam patient is alert and cooperative. She has a deep congested cough. Her TMs are clear. Nose is congested throat is normal neck is supple without adenopathy chest exam reveals rhonchi in both bases. Cardiac revealed irregular rate without murmurs rubs or gallops. Extremities are without edema or calf tenderness. [facility] reading (PRIMARY) by  Dr Cleta Alberts is no acute disease. There are no pneumonic infiltrates Results for orders placed in visit on 12/27/12  POCT CBC      Result Value Range   WBC 4.8  4.6 - 10.2 K/uL   Lymph, poc 1.6  0.6 - 3.4   POC LYMPH PERCENT 33.5  10 - 50 %L   MID (cbc) 0.3  0 - 0.9   POC MID % 6.7  0 - 12 %M   POC Granulocyte 2.9  2 - 6.9   Granulocyte percent 59.8  37 - 80 %G   RBC 4.57  4.04 - 5.48 M/uL   Hemoglobin 13.5  12.2 - 16.2 g/dL   HCT, POC 40.9  81.1 - 47.9 %   MCV 94.0  80 - 97 fL   MCH, POC 29.5  27 - 31.2 pg   MCHC 31.4 (*) 31.8 - 35.4 g/dL   RDW, POC 91.4     Platelet Count, POC 201  142 - 424 K/uL   MPV 8.5  0 - 99.8 fL   Peak Flow 280 after an albuterol treatment there is no change EKG there is a borderline short PR interval       Assessment & Plan:  Patient presents with acute bronchitis. On her EKG there is borderline short PR interval.. She is given a note until Thursday. She is going to make an appointment to see Dr. Laurann Montana and review her EKG with her. She will be treated with Omnicef and Hycodan and to take the Mucinex during the day

## 2012-12-27 NOTE — Patient Instructions (Signed)

## 2012-12-28 ENCOUNTER — Other Ambulatory Visit: Payer: Self-pay | Admitting: *Deleted

## 2012-12-28 MED ORDER — GABAPENTIN 300 MG PO CAPS: 300.0000 mg | ORAL_CAPSULE | Freq: Three times a day (TID) | ORAL | Status: DC

## 2012-12-28 NOTE — Telephone Encounter (Signed)
Reordered [facility] for [facility]. Notified [name] It was initially ordered at [facility] but reordered for 90 days through [facility].

## 2013-02-07 ENCOUNTER — Encounter (INDEPENDENT_AMBULATORY_CARE_PROVIDER_SITE_OTHER): Payer: Self-pay

## 2013-02-07 ENCOUNTER — Ambulatory Visit (INDEPENDENT_AMBULATORY_CARE_PROVIDER_SITE_OTHER): Payer: PRIVATE HEALTH INSURANCE | Admitting: Nurse Practitioner

## 2013-02-07 ENCOUNTER — Encounter: Payer: Self-pay | Admitting: Nurse Practitioner

## 2013-02-07 VITALS — BP 126/84 | HR 92 | Ht 66.5 in | Wt 166.0 lb

## 2013-02-07 DIAGNOSIS — IMO0002 Reserved for concepts with insufficient information to code with codable children: Secondary | ICD-10-CM

## 2013-02-07 DIAGNOSIS — G43019 Migraine without aura, intractable, without status migrainosus: Secondary | ICD-10-CM

## 2013-02-07 MED ORDER — DOXEPIN HCL 100 MG PO CAPS: 200.0000 mg | ORAL_CAPSULE | Freq: Every day | ORAL | Status: DC

## 2013-02-07 MED ORDER — TOPIRAMATE 100 MG PO TABS: 100.0000 mg | ORAL_TABLET | Freq: Every day | ORAL | Status: DC

## 2013-02-07 NOTE — Progress Notes (Signed)
[facility]  PATIENT: [name] DOB: [birth year]   REASON FOR VISIT: follow up for neuralgia    HISTORY OF PRESENT ILLNESS:[name], 62 year old female returns for followup. She has a history of left occipital neuralgia,and  left shoulder pain. The patient has done much better with physical therapy, and she uses ice and heat and stretching on a regular basis. The patient has done well with her headaches, and she has not had any headache pain since  November 2013. The patient is on gabapentin taking 300 mg tablets, 3  tablets at night. The patient is on doxepin  200 mg at night. The patient is sleeping better at present.  The patient overall feels as if she is doing much better.  The patient is also on Topamax taking 100 mg at night for headache prevention.     REVIEW OF SYSTEMS: Full 14 system review of systems performed and notable only for:  Constitutional: N/A  Cardiovascular: N/A  Ear/Nose/Throat: N/A  Skin: N/A  Eyes: N/A  Respiratory: N/A  Gastroitestinal: N/A  Hematology/Lymphatic: N/A  Endocrine: N/A Musculoskeletal:N/A  Allergy/Immunology: N/A  Neurological: N/A Psychiatric: N/A Sleep insomnia and restless legs   ALLERGIES: Allergies  Allergen Reactions  . Shellfish Allergy     All seafood    HOME MEDICATIONS: Outpatient Prescriptions Prior to Visit  Medication Sig Dispense Refill  . clidinium-chlordiazePOXIDE (LIBRAX) 2.5-5 MG per capsule Take 1 capsule by mouth 3 (three) times daily as needed.      . doxepin (SINEQUAN) 100 MG capsule Take 2 capsules (200 mg total) by mouth at bedtime.  60 capsule  5  . esomeprazole (NEXIUM) 40 MG capsule Take 40 mg by mouth daily before breakfast.      . estradiol (VIVELLE-DOT) 0.025 MG/24HR Place 1 patch onto the skin daily.      . furosemide (LASIX) 20 MG tablet Take 20 mg by mouth daily.       Marland Kitchen gabapentin (NEURONTIN) 300 MG capsule Take 1 capsule (300 mg total) by mouth 3 (three) times daily.   270 capsule  1  . lidocaine (LIDODERM) 5 % PLACE 2 PATCHES ONTO THE SKIN EVERY 12HRS.REMOVE & DISCARD PATCH WITHIN 12HRS OR AS DIRECTED  60 patch  6  . ramipril (ALTACE) 5 MG tablet Take 5 mg by mouth daily.      Marland Kitchen topiramate (TOPAMAX) 100 MG tablet Take 100 mg by mouth at bedtime.       . cefdinir (OMNICEF) 300 MG capsule Take 2 capsules (600 mg total) by mouth daily.  20 capsule  0  . HYDROcodone-homatropine (HYCODAN) 5-1.5 MG/5ML syrup Take 5 mLs by mouth every 8 (eight) hours as needed for cough.  120 mL  0   No facility-administered medications prior to visit.    PAST MEDICAL HISTORY: Past Medical History  Diagnosis Date  . Hyperlipidemia   . Chronic kidney disease   . Migraine headache   . GERD (gastroesophageal reflux disease)   . Occipital neuralgia     Left    PAST SURGICAL HISTORY: Past Surgical History  Procedure Laterality Date  . Abdominal hysterectomy    . Knee surgery    . Carpal tunnel release    . Elbow surgery    . Lung surgery    . Bladder repair    . Cervical spine surgery      FAMILY HISTORY: Family History  Problem Relation Age of Onset  . Cancer Mother   . Alzheimer's disease Mother   .  Cancer Brother   . Hyperlipidemia Brother     SOCIAL HISTORY: History   Social History  . Marital Status: Married    Spouse Name: [name]    Number of Children: 1  . Years of Education: 12+   Occupational History  . ACTIVITY DIRECTOR    Social History Main Topics  . Smoking status: Former Smoker -- 0.30 packs/day    Types: Cigarettes    Quit date: 10/31/2012  . Smokeless tobacco: Never Used  . Alcohol Use: No  . Drug Use: No  . Sexual Activity: Yes    Partners: Male     Comment: 1 sexual partner in last 10 months: married   Other Topics Concern  . Not on file   Social History Narrative   Patient is married ([name]).   Patient has one child.   Patient is working full-time.   Patient is right handed.   Patient has a college education.   Patient  drinks 6-8 cups of coffee daily.Marland Kitchen     PHYSICAL EXAM  Filed Vitals:   02/07/13 0827  BP: 126/84  Pulse: 92  Height: 5' 6.5" (1.689 m)  Weight: 166 lb (75.297 kg)   Body mass index is 26.39 kg/(m^2).  Generalized: Well developed, in no acute distress   Neurological examination   Mentation: Alert oriented to time, place, history taking. Follows all commands speech and language fluent  Cranial nerve II-XII: Pupils were equal round reactive to light extraocular movements were full, visual field were full on confrontational test. Facial sensation and strength were normal. hearing was intact to finger rubbing bilaterally. Uvula tongue midline. head turning and shoulder shrug and were normal and symmetric.Tongue protrusion into cheek strength was normal. Motor: normal bulk and tone, full strength in the BUE, BLE,  No focal weakness Sensory: normal and symmetric to light touch, pinprick, and  vibration  Coordination: finger-nose-finger, heel-to-shin bilaterally, no dysmetria Reflexes: Brachioradialis 2/2, biceps 2/2, triceps 2/2, patellar 2/2, Achilles 2/2, plantar responses were flexor bilaterally. Gait and Station: Rising up from seated position without assistance, normal stance,  moderate stride, good arm swing, smooth turning, able to perform tiptoe, and heel walking without difficulty. Tandem gait steady  DIAGNOSTIC DATA (LABS, IMAGING, TESTING) - I reviewed patient records, labs, notes, testing and imaging myself where available.  Lab Results  Component Value Date   WBC 4.8 12/27/2012   HGB 13.5 12/27/2012   HCT 43.0 12/27/2012   MCV 94.0 12/27/2012   PLT 484* 01/14/2007      ASSESSMENT AND PLAN  62 y.o. year old female  has a past medical history of Hyperlipidemia; Chronic kidney disease; Migraine headache; GERD (gastroesophageal reflux disease); and Occipital neuralgia. here to followup. She needs refills on her Topamax and doxepin  Continue Gabapentin does not need  refills Continue Topamax and Doxepin, will refill with 3 months supply.  F/U 6 to 8 months [name], [facility], [facility], APRN  [facility] [address] [place], [place] [address] [phone]

## 2013-02-07 NOTE — Progress Notes (Signed)
I have read the note, and I agree with the clinical assessment and plan.  Mclain Freer KEITH   

## 2013-02-07 NOTE — Patient Instructions (Signed)
Continue Gabapentin does not need refills Continue Topamax and Doxepin, will refill with 3 months supply.  F/U 6 to 8 months

## 2013-02-24 ENCOUNTER — Ambulatory Visit (INDEPENDENT_AMBULATORY_CARE_PROVIDER_SITE_OTHER): Payer: PRIVATE HEALTH INSURANCE | Admitting: Physician Assistant

## 2013-02-24 VITALS — BP 112/88 | HR 98 | Temp 98.1°F | Resp 16 | Ht 64.25 in | Wt 164.0 lb

## 2013-02-24 DIAGNOSIS — J019 Acute sinusitis, unspecified: Secondary | ICD-10-CM

## 2013-02-24 MED ORDER — AMOXICILLIN 875 MG PO TABS: 1750.0000 mg | ORAL_TABLET | Freq: Two times a day (BID) | ORAL | Status: DC

## 2013-02-24 MED ORDER — BENZONATATE 100 MG PO CAPS: 100.0000 mg | ORAL_CAPSULE | Freq: Three times a day (TID) | ORAL | Status: DC | PRN

## 2013-02-24 MED ORDER — GUAIFENESIN ER 1200 MG PO TB12: 1.0000 | ORAL_TABLET | Freq: Two times a day (BID) | ORAL | Status: DC | PRN

## 2013-02-24 MED ORDER — IPRATROPIUM BROMIDE 0.03 % NA SOLN: 2.0000 | Freq: Two times a day (BID) | NASAL | Status: DC

## 2013-02-24 NOTE — Patient Instructions (Signed)
Get plenty of rest and drink at least 64 ounces of water daily. 

## 2013-02-24 NOTE — Progress Notes (Signed)
   Subjective:    Patient ID: [name], female    DOB: 08-04-1950, 62 y.o.   MRN: [id number]  PCP: Cala Bradford, MD  Chief Complaint  Patient presents with  . Cough    X yesterday  . Sore Throat    X 5 days  . Head Congestion    X 5 days   Medications, allergies, past medical history, surgical history, family history, social history and problem list reviewed and updated.  HPI Symptoms began with sore throat and sinus congestion. Generalized malaise.  Cough, non-productive began yesterday.  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   Review of Systems As above.  No CP, SOB, dizziness, GI/GU symptoms.    Objective:   Physical Exam  Vitals reviewed. Constitutional: She is oriented to person, place, and time. Vital signs are normal. She appears well-developed and well-nourished. No distress.  HENT:  Head: Normocephalic and atraumatic.  Right Ear: Hearing, tympanic membrane, external ear and ear canal normal.  Left Ear: Hearing, tympanic membrane, external ear and ear canal normal.  Nose: Mucosal edema and rhinorrhea present.  No foreign bodies. Right sinus exhibits maxillary sinus tenderness and frontal sinus tenderness. Left sinus exhibits maxillary sinus tenderness and frontal sinus tenderness.  Mouth/Throat: Uvula is midline, oropharynx is clear and moist and mucous membranes are normal. No uvula swelling. No oropharyngeal exudate.  Eyes: Conjunctivae and EOM are normal. Pupils are equal, round, and reactive to light. Right eye exhibits no discharge. Left eye exhibits no discharge. No scleral icterus.  Neck: Trachea normal, normal range of motion and full passive range of motion without pain. Neck supple. No mass and no thyromegaly present.  Cardiovascular: Normal rate, regular rhythm and normal heart sounds.   Pulmonary/Chest: Effort normal and breath sounds normal.  Lymphadenopathy:       Head (right side): No  submandibular, no tonsillar, no preauricular, no posterior auricular and no occipital adenopathy present.       Head (left side): No submandibular, no tonsillar, no preauricular and no occipital adenopathy present.    She has no cervical adenopathy.       Right: No supraclavicular adenopathy present.       Left: No supraclavicular adenopathy present.  Neurological: She is alert and oriented to person, place, and time. She has normal strength. No cranial nerve deficit or sensory deficit.  Skin: Skin is warm, dry and intact. No rash noted.  Psychiatric: She has a normal mood and affect. Her speech is normal and behavior is normal.          Assessment & Plan:  1. Sinusitis, acute Supportive care.  Anticipatory guidance.  RTC if symptoms worsen/persist. - benzonatate (TESSALON) 100 MG capsule; Take 1-2 capsules (100-200 mg total) by mouth 3 (three) times daily as needed for cough.  Dispense: 40 capsule; Refill: 0 - ipratropium (ATROVENT) 0.03 % nasal spray; Place 2 sprays into both nostrils 2 (two) times daily.  Dispense: 30 mL; Refill: 0 - Guaifenesin (MUCINEX MAXIMUM STRENGTH) 1200 MG TB12; Take 1 tablet (1,200 mg total) by mouth every 12 (twelve) hours as needed.  Dispense: 14 tablet; Refill: 1 - amoxicillin (AMOXIL) 875 MG tablet; Take 2 tablets (1,750 mg total) by mouth 2 (two) times daily.  Dispense: 20 tablet; Refill: 0   Fernande Bras, PA-C Physician Assistant-Certified [facility]

## 2013-03-22 ENCOUNTER — Ambulatory Visit: Payer: Self-pay | Admitting: Physical Medicine and Rehabilitation

## 2013-03-28 ENCOUNTER — Ambulatory Visit (INDEPENDENT_AMBULATORY_CARE_PROVIDER_SITE_OTHER): Payer: PRIVATE HEALTH INSURANCE | Admitting: Emergency Medicine

## 2013-03-28 VITALS — BP 140/98 | HR 100 | Temp 98.4°F | Resp 16 | Ht 65.0 in | Wt 159.0 lb

## 2013-03-28 DIAGNOSIS — J019 Acute sinusitis, unspecified: Secondary | ICD-10-CM

## 2013-03-28 DIAGNOSIS — H6590 Unspecified nonsuppurative otitis media, unspecified ear: Secondary | ICD-10-CM

## 2013-03-28 DIAGNOSIS — J018 Other acute sinusitis: Secondary | ICD-10-CM

## 2013-03-28 MED ORDER — PSEUDOEPHEDRINE-GUAIFENESIN ER 60-600 MG PO TB12: 1.0000 | ORAL_TABLET | Freq: Two times a day (BID) | ORAL | Status: DC

## 2013-03-28 MED ORDER — AMOXICILLIN 875 MG PO TABS: 1750.0000 mg | ORAL_TABLET | Freq: Two times a day (BID) | ORAL | Status: DC

## 2013-03-28 NOTE — Patient Instructions (Signed)
Serous Otitis Media  Serous otitis media is fluid in the middle ear space. This space contains the bones for hearing and air. Air in the middle ear space helps to transmit sound.  The air gets there through the eustachian tube. This tube goes from the back of the nose (nasopharynx) to the middle ear space. It keeps the pressure in the middle ear the same as the outside world. It also helps to drain fluid from the middle ear space. CAUSES  Serous otitis media occurs when the eustachian tube gets blocked. Blockage can come from:  Ear infections.  Colds and other upper respiratory infections.  Allergies.  Irritants such as cigarette smoke.  Sudden changes in air pressure (such as descending in an airplane).  Enlarged adenoids.  A mass in the nasopharynx. During colds and upper respiratory infections, the middle ear space can become temporarily filled with fluid. This can happen after an ear infection also. Once the infection clears, the fluid will generally drain out of the ear through the eustachian tube. If it does not, then serous otitis media occurs. SIGNS AND SYMPTOMS   Hearing loss.  A feeling of fullness in the ear, without pain.  Young children may not show any symptoms but may show slight behavioral changes, such as agitation, ear pulling, or crying. DIAGNOSIS  Serous otitis media is diagnosed by an ear exam. Tests may be done to check on the movement of the eardrum. Hearing exams may also be done. TREATMENT  The fluid most often goes away without treatment. If allergy is the cause, allergy treatment may be helpful. Fluid that persists for several months may require minor surgery. A small tube is placed in the eardrum to:  Drain the fluid.  Restore the air in the middle ear space. In certain situations, antibiotics are used to avoid surgery. Surgery may be done to remove enlarged adenoids (if this is the cause). HOME CARE INSTRUCTIONS   Keep children away from tobacco  smoke.  Be sure to keep any follow-up appointments. SEEK MEDICAL CARE IF:   Your hearing is not better in 3 months.  Your hearing is worse.  You have ear pain.  You have drainage from the ear.  You have dizziness.  You have serous otitis media only in one ear or have any bleeding from your nose (epistaxis).  You notice a lump on your neck. MAKE SURE YOU:  Understand these instructions.   Will watch your condition.   Will get help right away if you are not doing well or get worse.  Document Released: 05/17/2003 Document Revised: 10/27/2012 Document Reviewed: 09/21/2012 [employer] Patient Information 2014 [employer].  

## 2013-03-28 NOTE — Progress Notes (Signed)
[facility] [address] [place] [address] [phone]  Date:  03/28/2013   Name:  [name]   DOB:  09/16/1950   MRN:  [id number]  PCP:  Vidal Schwalbe, MD    Chief Complaint: Otalgia and Sinusitis   History of Present Illness:  [name] is a 63 y.o. very pleasant female patient who presents with the following:  Ill for a week with post nasal drainage and congestion, sore throat with painful swallowing.  On Friday developed pain in left ear.  Started to have fever or chills yesterday.  No nausea or vomiting.  No wheezing or shortness of breath.  Works in a nursing home.  No improvement with over the counter medications or other home remedies. . Denies other complaint or health concern today.    Patient Active Problem List   Diagnosis Date Noted  . Neuralgia, neuritis, and radiculitis, unspecified 02/04/2012  . Migraine without aura, with intractable migraine, so stated, without mention of status migrainosus 02/04/2012  . 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 Myofascial muscle pain 07/15/2011    Past Medical History  Diagnosis Date  . Hyperlipidemia   . Chronic kidney disease   . Migraine headache   . GERD (gastroesophageal reflux disease)   . Occipital neuralgia     Left    Past Surgical History  Procedure Laterality Date  . Abdominal hysterectomy    . Knee surgery    . Carpal tunnel release    . Elbow surgery    . Lung surgery    . Bladder repair    . Cervical spine surgery      History  Substance Use Topics  . Smoking status: Former Smoker -- 0.30 packs/day    Types: Cigarettes    Quit date: 10/31/2012  . Smokeless tobacco: Never Used  . Alcohol Use: No    Family History  Problem Relation Age of Onset  . Cancer Mother 87    breast  . Alzheimer's disease Mother   . Cancer Brother   . Hyperlipidemia Brother     Allergies  Allergen Reactions  . Shellfish Allergy     All seafood    Medication list has been  reviewed and updated.  Current Outpatient Prescriptions on File Prior to Visit  Medication Sig Dispense Refill  . clidinium-chlordiazePOXIDE (LIBRAX) 2.5-5 MG per capsule Take 1 capsule by mouth 3 (three) times daily as needed.      . doxepin (SINEQUAN) 100 MG capsule Take 2 capsules (200 mg total) by mouth at bedtime.  180 capsule  2  . esomeprazole (NEXIUM) 40 MG capsule Take 40 mg by mouth daily before breakfast.      . estradiol (VIVELLE-DOT) 0.025 MG/24HR Place 1 patch onto the skin daily.      . furosemide (LASIX) 20 MG tablet Take 20 mg by mouth daily.       Marland Kitchen gabapentin (NEURONTIN) 300 MG capsule Take 1 capsule (300 mg total) by mouth 3 (three) times daily.  270 capsule  1  . lidocaine (LIDODERM) 5 % PLACE 2 PATCHES ONTO THE SKIN EVERY 12HRS.REMOVE & DISCARD PATCH WITHIN 12HRS OR AS DIRECTED  60 patch  6  . ramipril (ALTACE) 5 MG tablet Take 5 mg by mouth daily.      Marland Kitchen topiramate (TOPAMAX) 100 MG tablet Take 1 tablet (100 mg total) by mouth at bedtime.  90 tablet  2  . amoxicillin (AMOXIL) 875 MG tablet Take 2 tablets (1,750  mg total) by mouth 2 (two) times daily.  20 tablet  0  . benzonatate (TESSALON) 100 MG capsule Take 1-2 capsules (100-200 mg total) by mouth 3 (three) times daily as needed for cough.  40 capsule  0  . Guaifenesin (MUCINEX MAXIMUM STRENGTH) 1200 MG TB12 Take 1 tablet (1,200 mg total) by mouth every 12 (twelve) hours as needed.  14 tablet  1  . ipratropium (ATROVENT) 0.03 % nasal spray Place 2 sprays into both nostrils 2 (two) times daily.  30 mL  0   No current facility-administered medications on file prior to visit.    Review of Systems:  As per HPI, otherwise negative.    Physical Examination: Filed Vitals:   03/28/13 1428  BP: 140/98  Pulse: 100  Temp: 98.4 F (36.9 C)  Resp: 16   Filed Vitals:   03/28/13 1428  Height: 5\' 5"  (1.651 m)  Weight: 159 lb (72.122 kg)   Body mass index is 26.46 kg/(m^2). Ideal Body Weight: Weight in (lb) to have BMI  = 25: 149.9  GEN: WDWN, NAD, Non-toxic, A & O x 3 HEENT: Atraumatic, Normocephalic. Neck supple. No masses, No LAD.  Oropharynx negative Ears and Nose: No external deformity.  Right serous otitis  Purulent nasal drainage CV: RRR, No M/G/R. No JVD. No thrill. No extra heart sounds. PULM: CTA B, no wheezes, crackles, rhonchi. No retractions. No resp. distress. No accessory muscle use. ABD: S, NT, ND, +BS. No rebound. No HSM. EXTR: No c/c/e NEURO Normal gait.  PSYCH: Normally interactive. Conversant. Not depressed or anxious appearing.  Calm demeanor.    Assessment and Plan: Serous otitis media mucinex d Sinusitis Amoxicillin   Signed,  Ellison Carwin, MD

## 2013-04-08 ENCOUNTER — Ambulatory Visit: Payer: Worker's Compensation | Admitting: Physical Medicine & Rehabilitation

## 2013-04-12 ENCOUNTER — Other Ambulatory Visit: Payer: Self-pay

## 2013-04-12 MED ORDER — TOPIRAMATE 100 MG PO TABS: 100.0000 mg | ORAL_TABLET | Freq: Every day | ORAL | Status: DC

## 2013-05-13 ENCOUNTER — Encounter: Payer: Self-pay | Admitting: Physical Medicine & Rehabilitation

## 2013-05-13 ENCOUNTER — Ambulatory Visit (HOSPITAL_BASED_OUTPATIENT_CLINIC_OR_DEPARTMENT_OTHER): Payer: PRIVATE HEALTH INSURANCE | Admitting: Physical Medicine & Rehabilitation

## 2013-05-13 ENCOUNTER — Encounter: Payer: PRIVATE HEALTH INSURANCE | Attending: Physical Medicine & Rehabilitation

## 2013-05-13 VITALS — BP 125/74 | HR 84 | Resp 14 | Ht 66.0 in | Wt 167.0 lb

## 2013-05-13 DIAGNOSIS — K219 Gastro-esophageal reflux disease without esophagitis: Secondary | ICD-10-CM | POA: Insufficient documentation

## 2013-05-13 DIAGNOSIS — N189 Chronic kidney disease, unspecified: Secondary | ICD-10-CM | POA: Insufficient documentation

## 2013-05-13 DIAGNOSIS — G588 Other specified mononeuropathies: Secondary | ICD-10-CM

## 2013-05-13 DIAGNOSIS — E785 Hyperlipidemia, unspecified: Secondary | ICD-10-CM | POA: Insufficient documentation

## 2013-05-13 DIAGNOSIS — Z87891 Personal history of nicotine dependence: Secondary | ICD-10-CM | POA: Insufficient documentation

## 2013-05-13 DIAGNOSIS — G8912 Acute post-thoracotomy pain: Secondary | ICD-10-CM | POA: Insufficient documentation

## 2013-05-13 DIAGNOSIS — G548 Other nerve root and plexus disorders: Secondary | ICD-10-CM

## 2013-05-13 MED ORDER — LIDOCAINE 5 % EX PTCH: MEDICATED_PATCH | CUTANEOUS | Status: DC

## 2013-05-13 MED ORDER — GABAPENTIN 300 MG PO CAPS: 300.0000 mg | ORAL_CAPSULE | Freq: Three times a day (TID) | ORAL | Status: DC

## 2013-05-13 NOTE — Patient Instructions (Signed)
Continue current medications.  At night may try May try aspirin cream over left ribs May try capsaicin  cream

## 2013-05-13 NOTE — Progress Notes (Signed)
Subjective:    Patient ID: [name], female    DOB: 14-Jun-1950, 63 y.o.   MRN: [id number]  HPI DOI:  February 25, 2005 Off [facility] now , using private insurance Continues to work full-time. 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  Her neurologist prescribed Topamax for migraine headaches 100 mg at night Pain Inventory Average Pain 8 Pain Right Now 8 My pain is burning, dull and stabbing  In the last 24 hours, has pain interfered with the following? General activity 8 Relation with others 8 Enjoyment of life 8 What TIME of day is your pain at its worst? evening Sleep (in general) Fair  Pain is worse with: bending and sitting Pain improves with: rest, heat/ice and medication Relief from Meds: 5  Mobility walk without assistance how many minutes can you walk? 45-60 ability to climb steps?  yes do you drive?  yes transfers alone Do you have any goals in this area?  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  no  Neuro/Psych No problems in this area  Prior Studies Any changes since last visit?  no  Physicians involved in your care Any changes since last visit?  no   Family History  Problem Relation Age of Onset  . Cancer Mother 32    breast  . Alzheimer's disease Mother   . Cancer Brother   . Hyperlipidemia Brother    History   Social History  . Marital Status: Married    Spouse Name: [name]    Number of Children: 1  . Years of Education: 12+   Occupational History  . ACTIVITY DIRECTOR   . CNA    Social History Main Topics  . Smoking status: Former Smoker -- 0.30 packs/day    Types: Cigarettes    Quit date: 10/31/2012  . Smokeless tobacco: Never Used  . Alcohol Use: No  . Drug Use: No  . Sexual Activity: Yes    Partners: Male    Birth Control/ Protection: Surgical     Comment: 1 sexual partner in last 12 months:  married   Other Topics Concern  . None   Social History Narrative   Patient is married ([name]).   Patient has one child.   Patient is working full-time.   Patient is right handed.   Patient has a college education.   Patient drinks 6-8 cups of coffee daily..   Past Surgical History  Procedure Laterality Date  . Abdominal hysterectomy    . Knee surgery    . Carpal tunnel release    . Elbow surgery    . Lung surgery    . Bladder repair    . Cervical spine surgery     Past Medical History  Diagnosis Date  . Hyperlipidemia   . Chronic kidney disease   . Migraine headache   . GERD (gastroesophageal reflux disease)   . Occipital neuralgia     Left   BP 125/74  Pulse 84  Resp 14  Ht 5\' 6"  (1.676 m)  Wt 167 lb (75.751 kg)  BMI 26.97 kg/m2  SpO2 98%  Opioid Risk Score:   Fall Risk Score: Low Fall Risk (0-5 points) (patient educated handout declined)   Review of Systems  Musculoskeletal: Positive for arthralgias and myalgias.  All other systems reviewed and are negative.       Objective:  Physical Exam  Tenderness palpation along the T8 T9-T10 ribs left posterior lateral aspect, pain along the anterior aspect of the ribs as well Healed incision around T8/T9 area left side Ambulation is normal  General no acute distress  Mood and affect are appropriate       Assessment & Plan:  1. 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

## 2013-06-23 ENCOUNTER — Ambulatory Visit (INDEPENDENT_AMBULATORY_CARE_PROVIDER_SITE_OTHER): Payer: PRIVATE HEALTH INSURANCE | Admitting: Emergency Medicine

## 2013-06-23 VITALS — BP 110/80 | HR 104 | Temp 98.4°F | Resp 16 | Ht 67.0 in | Wt 160.4 lb

## 2013-06-23 DIAGNOSIS — E869 Volume depletion, unspecified: Secondary | ICD-10-CM

## 2013-06-23 DIAGNOSIS — A088 Other specified intestinal infections: Secondary | ICD-10-CM

## 2013-06-23 MED ORDER — LOPERAMIDE HCL 2 MG PO TABS: ORAL_TABLET | ORAL | Status: DC

## 2013-06-23 MED ORDER — ONDANSETRON 8 MG PO TBDP: 8.0000 mg | ORAL_TABLET | Freq: Three times a day (TID) | ORAL | Status: DC | PRN

## 2013-06-23 NOTE — Progress Notes (Signed)
[facility] [address] [place] [address] [phone]  Date:  06/23/2013   Name:  [name]   DOB:  [birth year]   MRN:  [id number]  PCP:  Vidal Schwalbe, MD    Chief Complaint: Emesis, Diarrhea and Headache   History of Present Illness:  [name] is a 63 y.o. very pleasant female patient who presents with the following:  Ill with nausea and vomiting and diarrhea.  Evaluated Tuesday for renal stone with negative workup.  Poor po intake.  Tuesday had fever 102 and no chills.    The patient has no complaint of blood, mucous, or pus in her stools. No ill contacts.  Works in nursing home.  No improvement with over the counter medications or other home remedies. Denies other complaint or health concern today.   Patient Active Problem List   Diagnosis Date Noted  . 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 Neuralgia, neuritis, and radiculitis, unspecified 02/04/2012  . Migraine without aura, with intractable migraine, so stated, without mention of status migrainosus 02/04/2012  . 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 Myofascial muscle pain 07/15/2011    Past Medical History  Diagnosis Date  . Hyperlipidemia   . Chronic kidney disease   . Migraine headache   . GERD (gastroesophageal reflux disease)   . Occipital neuralgia     Left    Past Surgical History  Procedure Laterality Date  . Abdominal hysterectomy    . Knee surgery    . Carpal tunnel release    . Elbow surgery    . Lung surgery    . Bladder repair    . Cervical spine surgery      History  Substance Use Topics  . Smoking status: Former Smoker -- 0.30 packs/day    Types: Cigarettes    Quit date: 10/31/2012  . Smokeless tobacco: Never Used  . Alcohol Use: No    Family History  Problem Relation Age of Onset  . Cancer Mother 60    breast  . Alzheimer's disease Mother   . Cancer Brother   . Hyperlipidemia Brother     Allergies  Allergen Reactions  . Shellfish  Allergy     All seafood    Medication list has been reviewed and updated.  Current Outpatient Prescriptions on File Prior to Visit  Medication Sig Dispense Refill  . clidinium-chlordiazePOXIDE (LIBRAX) 2.5-5 MG per capsule Take 1 capsule by mouth 3 (three) times daily as needed.      . doxepin (SINEQUAN) 100 MG capsule Take 2 capsules (200 mg total) by mouth at bedtime.  180 capsule  2  . esomeprazole (NEXIUM) 40 MG capsule Take 40 mg by mouth daily before breakfast.      . estradiol (VIVELLE-DOT) 0.025 MG/24HR Place 1 patch onto the skin daily.      . furosemide (LASIX) 20 MG tablet Take 20 mg by mouth daily.       Marland Kitchen gabapentin (NEURONTIN) 300 MG capsule Take 1 capsule (300 mg total) by mouth 3 (three) times daily.  270 capsule  3  . lidocaine (LIDODERM) 5 % PLACE 2 PATCHES ONTO THE SKIN EVERY 12HRS.REMOVE & DISCARD PATCH WITHIN 12HRS OR AS DIRECTED  60 patch  11  . ramipril (ALTACE) 5 MG tablet Take 5 mg by mouth daily.      Marland Kitchen topiramate (TOPAMAX) 100 MG tablet Take 1 tablet (100 mg total) by mouth at bedtime.  90 tablet  2   No  current facility-administered medications on file prior to visit.    Review of Systems:  As per HPI, otherwise negative.    Physical Examination: Filed Vitals:   06/23/13 1705  BP: 110/80  Pulse: 104  Temp: 98.4 F (36.9 C)  Resp: 16   Filed Vitals:   06/23/13 1705  Height: 5\' 7"  (1.702 m)  Weight: 160 lb 6.4 oz (72.757 kg)   Body mass index is 25.12 kg/(m^2). Ideal Body Weight: Weight in (lb) to have BMI = 25: 159.3  GEN: WDWN, NAD, Non-toxic, A & O x 3 HEENT: Atraumatic, Normocephalic. Neck supple. No masses, No LAD. Ears and Nose: No external deformity. CV: RRR, No M/G/R. No JVD. No thrill. No extra heart sounds. PULM: CTA B, no wheezes, crackles, rhonchi. No retractions. No resp. distress. No accessory muscle use. ABD: S, NT, ND, +BS. No rebound. No HSM. EXTR: No c/c/e NEURO Normal gait.  PSYCH: Normally interactive. Conversant. Not  depressed or anxious appearing.  Calm demeanor.    Assessment and Plan: Gastroenteritis zofran Imodium Clears 2 liters IVF   Signed,  Ellison Carwin, MD

## 2013-06-23 NOTE — Patient Instructions (Signed)
Viral Gastroenteritis Viral gastroenteritis is also known as stomach flu. This condition affects the stomach and intestinal tract. It can cause sudden diarrhea and vomiting. The illness typically lasts 3 to 8 days. Most people develop an immune response that eventually gets rid of the virus. While this natural response develops, the virus can make you quite ill. CAUSES  Many different viruses can cause gastroenteritis, such as rotavirus or noroviruses. You can catch one of these viruses by consuming contaminated food or water. You may also catch a virus by sharing utensils or other personal items with an infected person or by touching a contaminated surface. SYMPTOMS  The most common symptoms are diarrhea and vomiting. These problems can cause a severe loss of body fluids (dehydration) and a body salt (electrolyte) imbalance. Other symptoms may include:  Fever.  Headache.  Fatigue.  Abdominal pain. DIAGNOSIS  Your caregiver can usually diagnose viral gastroenteritis based on your symptoms and a physical exam. A stool sample may also be taken to test for the presence of viruses or other infections. TREATMENT  This illness typically goes away on its own. Treatments are aimed at rehydration. The most serious cases of viral gastroenteritis involve vomiting so severely that you are not able to keep fluids down. In these cases, fluids must be given through an intravenous line (IV). HOME CARE INSTRUCTIONS   Drink enough fluids to keep your urine clear or pale yellow. Drink small amounts of fluids frequently and increase the amounts as tolerated.  Ask your caregiver for specific rehydration instructions.  Avoid:  Foods high in sugar.  Alcohol.  Carbonated drinks.  Tobacco.  Juice.  Caffeine drinks.  Extremely hot or cold fluids.  Fatty, greasy foods.  Too much intake of anything at one time.  Dairy products until 24 to 48 hours after diarrhea stops.  You may consume probiotics.  Probiotics are active cultures of beneficial bacteria. They may lessen the amount and number of diarrheal stools in adults. Probiotics can be found in yogurt with active cultures and in supplements.  Wash your hands well to avoid spreading the virus.  Only take over-the-counter or prescription medicines for pain, discomfort, or fever as directed by your caregiver. Do not give aspirin to children. Antidiarrheal medicines are not recommended.  Ask your caregiver if you should continue to take your regular prescribed and over-the-counter medicines.  Keep all follow-up appointments as directed by your caregiver. SEEK IMMEDIATE MEDICAL CARE IF:   You are unable to keep fluids down.  You do not urinate at least once every 6 to 8 hours.  You develop shortness of breath.  You notice blood in your stool or vomit. This may look like coffee grounds.  You have abdominal pain that increases or is concentrated in one small area (localized).  You have persistent vomiting or diarrhea.  You have a fever.  The patient is a child younger than 3 months, and he or she has a fever.  The patient is a child older than 3 months, and he or she has a fever and persistent symptoms.  The patient is a child older than 3 months, and he or she has a fever and symptoms suddenly get worse.  The patient is a baby, and he or she has no tears when crying. MAKE SURE YOU:   Understand these instructions.  Will watch your condition.  Will get help right away if you are not doing well or get worse. Document Released: 02/24/2005 Document Revised: 05/19/2011 Document Reviewed: 12/11/2010   ExitCare Patient Information 2014 [employer]. Diet The clear liquid diet consists of foods that are liquid or will become liquid at room temperature. Examples of foods allowed on a clear liquid diet include fruit juice, broth or bouillon, gelatin, or frozen ice pops. You should be able to see through the liquid. The purpose of  this diet is to provide the necessary fluids, electrolytes (such as sodium and potassium), and energy to keep the body functioning during times when you are not able to consume a regular diet. A clear liquid diet should not be continued for long periods of time, as it is not nutritionally adequate.  A CLEAR LIQUID DIET MAY BE NEEDED:  When a sudden-onset (acute) condition occurs before or after surgery.   As the first step in oral feeding.   For fluid and electrolyte replacement in diarrheal diseases.   As a diet before certain medical tests are performed.  ADEQUACY The clear liquid diet is adequate only in ascorbic acid, according to the Recommended Dietary Allowances of the National Research Council.  CHOOSING FOODS Breads and Starches  Allowed: None are allowed.   Avoid: All are to be avoided.  Vegetables  Allowed: Strained vegetable juices.   Avoid: Any others.  Fruit  Allowed: Strained fruit juices and fruit drinks. Include 1 serving of citrus or vitamin C-enriched fruit juice daily.   Avoid: Any others.  Meat and Meat Substitutes  Allowed: None are allowed.   Avoid: All are to be avoided.  Milk Products  Allowed: None are allowed.   Avoid: All are to be avoided.  Soups and Combination Foods  Allowed: Clear bouillon, broth, or strained broth-based soups.   Avoid: Any others.  Desserts and Sweets  Allowed: Sugar, honey. High-protein gelatin. Flavored gelatin, ices, or frozen ice pops that do not contain milk.   Avoid: Any others.  Fats and Oils  Allowed: None are allowed.   Avoid: All are to be avoided.  Beverages  Allowed: Cereal beverages, coffee (regular or decaffeinated), tea, or soda at the discretion of your health care provider.   Avoid: Any others.  Condiments  Allowed: Salt.   Avoid: Any others, including pepper.  Supplements  Allowed: Liquid nutrition beverages that you can see  through.   Avoid: Any others that contain lactose or fiber. SAMPLE MEAL PLAN Breakfast  4 oz (120 mL) strained orange juice.   to 1 cup (120 to 240 mL) gelatin (plain or fortified).  1 cup (240 mL) beverage (coffee or tea).  Sugar, if desired. Midmorning Snack   cup (120 mL) gelatin (plain or fortified). Lunch  1 cup (240 mL) broth or consomm.  4 oz (120 mL) strained grapefruit juice.   cup (120 mL) gelatin (plain or fortified).  1 cup (240 mL) beverage (coffee or tea).  Sugar, if desired. Midafternoon Snack   cup (120 mL) fruit ice.   cup (120 mL) strained fruit juice. Dinner  1 cup (240 mL) broth or consomm.   cup (120 mL) cranberry juice.   cup (120 mL) flavored gelatin (plain or fortified).  1 cup (240 mL) beverage (coffee or tea).  Sugar, if desired. Evening Snack  4 oz (120 mL) strained apple juice (vitamin C-fortified).   cup (120 mL) flavored gelatin (plain or fortified). MAKE SURE YOU:  Understand these instructions.  Will watch your child's condition.  Will get help right away if your child is not doing well or gets worse. Document Released: 02/24/2005 Document Revised: 10/27/2012 Document Reviewed: 07/27/2012   ExitCare Patient Information 2014 [employer].  

## 2013-07-18 ENCOUNTER — Ambulatory Visit (INDEPENDENT_AMBULATORY_CARE_PROVIDER_SITE_OTHER): Payer: PRIVATE HEALTH INSURANCE | Admitting: Physician Assistant

## 2013-07-18 VITALS — BP 150/98 | HR 102 | Temp 99.3°F | Resp 16 | Ht 64.5 in | Wt 164.0 lb

## 2013-07-18 DIAGNOSIS — N390 Urinary tract infection, site not specified: Secondary | ICD-10-CM

## 2013-07-18 DIAGNOSIS — I10 Essential (primary) hypertension: Secondary | ICD-10-CM | POA: Insufficient documentation

## 2013-07-18 DIAGNOSIS — R350 Frequency of micturition: Secondary | ICD-10-CM

## 2013-07-18 DIAGNOSIS — R509 Fever, unspecified: Secondary | ICD-10-CM

## 2013-07-18 LAB — POCT CBC
Granulocyte percent: 79.9 %G (ref 37–80)
HCT, POC: 41.6 % (ref 37.7–47.9)
Hemoglobin: 13.7 g/dL (ref 12.2–16.2)
Lymph, poc: 1.3 (ref 0.6–3.4)
MCH, POC: 29.3 pg (ref 27–31.2)
MCHC: 32.9 g/dL (ref 31.8–35.4)
MCV: 88.9 fL (ref 80–97)
MID (cbc): 0.4 (ref 0–0.9)
MPV: 9.4 fL (ref 0–99.8)
POC Granulocyte: 7 — AB (ref 2–6.9)
POC LYMPH PERCENT: 15.1 %L (ref 10–50)
POC MID %: 5 %M (ref 0–12)
Platelet Count, POC: 255 10*3/uL (ref 142–424)
RBC: 4.68 M/uL (ref 4.04–5.48)
RDW, POC: 14.3 %
WBC: 8.7 10*3/uL (ref 4.6–10.2)

## 2013-07-18 LAB — POCT URINALYSIS DIPSTICK
Bilirubin, UA: NEGATIVE
Glucose, UA: NEGATIVE
Ketones, UA: NEGATIVE
Nitrite, UA: NEGATIVE
Protein, UA: NEGATIVE
Spec Grav, UA: <=1.005
Urobilinogen, UA: 0.2
pH, UA: 5.5

## 2013-07-18 LAB — POCT UA - MICROSCOPIC ONLY
Casts, Ur, LPF, POC: NEGATIVE
Crystals, Ur, HPF, POC: NEGATIVE
Mucus, UA: NEGATIVE
Yeast, UA: NEGATIVE

## 2013-07-18 MED ORDER — SULFAMETHOXAZOLE-TMP DS 800-160 MG PO TABS: 1.0000 | ORAL_TABLET | Freq: Two times a day (BID) | ORAL | Status: DC

## 2013-07-18 NOTE — Progress Notes (Signed)
Subjective:    Patient ID: [name], female    DOB: [birth year], 63 y.o.   MRN: [id number]  HPI Pt presents to clinic with urinary symptoms that she has had for about 3 days - seems to be getting worse - she is having low back pain today that is slightly worse today - her urine is really strong but she is not having any dysuria but she is having frequent urination.  This feels like when she has had a UTI in the past.  She has had no change in sexual partner.  OTC meds - none  Review of Systems  Constitutional: Positive for fever (101.9) and chills.  Genitourinary: Positive for urgency and frequency. Negative for dysuria and vaginal discharge.       Strong smelling urine  Musculoskeletal: Positive for myalgias.       Objective:   Physical Exam  Vitals reviewed. Constitutional: She is oriented to person, place, and time. She appears well-developed and well-nourished.  HENT:  Head: Normocephalic and atraumatic.  Right Ear: External ear normal.  Left Ear: External ear normal.  Eyes: Scleral icterus is present.  Cardiovascular: Normal rate, regular rhythm and normal heart sounds.   Pulmonary/Chest: Effort normal and breath sounds normal. She has no wheezes.  Abdominal: Soft. Bowel sounds are normal. There is tenderness (suprapubic) in the suprapubic area. There is no CVA tenderness.  Neurological: She is alert and oriented to person, place, and time.  Skin: Skin is warm and dry.  Psychiatric: She has a normal mood and affect. Her behavior is normal. Judgment and thought content normal.   Results for orders placed in visit on 07/18/13  POCT URINALYSIS DIPSTICK      Result Value Ref Range   Color, UA yellow     Clarity, UA slightly cloudy     Glucose, UA neg     Bilirubin, UA neg     Ketones, UA neg     Spec Grav, UA <=1.005     Blood, UA small     pH, UA 5.5     Protein, UA neg     Urobilinogen, UA 0.2     Nitrite, UA neg     Leukocytes, UA large (3+)    POCT UA -  MICROSCOPIC ONLY      Result Value Ref Range   WBC, Ur, HPF, POC 11-15     RBC, urine, microscopic 1-2     Bacteria, U Microscopic 4+     Mucus, UA neg     Epithelial cells, urine per micros 0-1     Crystals, Ur, HPF, POC neg     Casts, Ur, LPF, POC neg     Yeast, UA neg    POCT CBC      Result Value Ref Range   WBC 8.7  4.6 - 10.2 K/uL   Lymph, poc 1.3  0.6 - 3.4   POC LYMPH PERCENT 15.1  10 - 50 %L   MID (cbc) 0.4  0 - 0.9   POC MID % 5.0  0 - 12 %M   POC Granulocyte 7.0 (*) 2 - 6.9   Granulocyte percent 79.9  37 - 80 %G   RBC 4.68  4.04 - 5.48 M/uL   Hemoglobin 13.7  12.2 - 16.2 g/dL   HCT, POC 41.6  37.7 - 47.9 %   MCV 88.9  80 - 97 fL   MCH, POC 29.3  27 - 31.2 pg   MCHC 32.9  31.8 - 35.4 g/dL   RDW, POC 14.3     Platelet Count, POC 255  142 - 424 K/uL   MPV 9.4  0 - 99.8 fL        Assessment & Plan:  Urinary frequency - Plan: POCT urinalysis dipstick, POCT UA - Microscopic Only, Urine culture  Fever, unspecified - Plan: POCT CBC  UTI (urinary tract infection) - Plan: sulfamethoxazole-trimethoprim (BACTRIM DS) 800-160 MG per tablet, Urine culture  Pt will push fluids and take all abx.  She will f/u if she is not improved in 3 days.  Windell Hummingbird PA-C  [facility] 07/18/2013 5:11 PM

## 2013-07-22 LAB — URINE CULTURE: Colony Count: >=100000

## 2013-08-02 ENCOUNTER — Other Ambulatory Visit: Payer: Self-pay | Admitting: Neurology

## 2013-08-05 ENCOUNTER — Encounter: Payer: Self-pay | Admitting: Nurse Practitioner

## 2013-09-04 ENCOUNTER — Ambulatory Visit (INDEPENDENT_AMBULATORY_CARE_PROVIDER_SITE_OTHER): Payer: PRIVATE HEALTH INSURANCE | Admitting: Physician Assistant

## 2013-09-04 VITALS — BP 116/80 | HR 106 | Temp 98.8°F | Resp 16 | Ht 64.25 in | Wt 163.6 lb

## 2013-09-04 DIAGNOSIS — R05 Cough: Secondary | ICD-10-CM

## 2013-09-04 DIAGNOSIS — J0100 Acute maxillary sinusitis, unspecified: Secondary | ICD-10-CM

## 2013-09-04 DIAGNOSIS — R059 Cough, unspecified: Secondary | ICD-10-CM

## 2013-09-04 DIAGNOSIS — J309 Allergic rhinitis, unspecified: Secondary | ICD-10-CM

## 2013-09-04 MED ORDER — HYDROCODONE-HOMATROPINE 5-1.5 MG/5ML PO SYRP: 5.0000 mL | ORAL_SOLUTION | Freq: Three times a day (TID) | ORAL | Status: DC | PRN

## 2013-09-04 MED ORDER — AMOXICILLIN-POT CLAVULANATE 875-125 MG PO TABS: 1.0000 | ORAL_TABLET | Freq: Two times a day (BID) | ORAL | Status: DC

## 2013-09-04 MED ORDER — FLUTICASONE PROPIONATE 50 MCG/ACT NA SUSP: 2.0000 | Freq: Every day | NASAL | Status: DC

## 2013-09-04 MED ORDER — BENZONATATE 100 MG PO CAPS: 100.0000 mg | ORAL_CAPSULE | Freq: Three times a day (TID) | ORAL | Status: DC | PRN

## 2013-09-04 NOTE — Progress Notes (Signed)
   Subjective:    Patient ID: [name], female    DOB: 06/02/1950, 63 y.o.   MRN: [id number]  HPI   [name] is a pleasant 63 yr old female here with concern for illness.  She reports 1 week of sinus drainage, throat pain, left ear pain, coughing.  Nasal drainage is green and body.  She is coughing up green mucus as well.  No SOB or wheezing.  No fever or chills.  +maxillary sinus pain.  No sick contacts.  Taking Nyquil and Dayquil.  Was improving but seems to be worsening again.  She does suffer with allergies - takes Allegra D daily.  Former smoker, quit Aug 2014 - occ vapes   Review of Systems  Constitutional: Negative for fever and chills.  HENT: Positive for congestion, ear pain, postnasal drip, rhinorrhea, sinus pressure and sore throat.   Respiratory: Positive for cough. Negative for shortness of breath and wheezing.   Cardiovascular: Negative.   Gastrointestinal: Negative for nausea, vomiting and abdominal pain.  Musculoskeletal: Negative.   Skin: Negative.        Objective:   Physical Exam  Vitals reviewed. Constitutional: She is oriented to person, place, and time. She appears well-developed and well-nourished. No distress.  HENT:  Head: Normocephalic and atraumatic.  Right Ear: Tympanic membrane and ear canal normal.  Left Ear: Tympanic membrane and ear canal normal.  Nose: Right sinus exhibits maxillary sinus tenderness. Left sinus exhibits maxillary sinus tenderness.  Mouth/Throat: Uvula is midline, oropharynx is clear and moist and mucous membranes are normal.  Eyes: Conjunctivae are normal. No scleral icterus.  Neck: Neck supple.  Cardiovascular: Normal rate, regular rhythm and normal heart sounds.   Pulmonary/Chest: Effort normal and breath sounds normal. She has no wheezes. She has no rales.  Lymphadenopathy:    She has no cervical adenopathy.  Neurological: She is alert and oriented to person, place, and time.  Skin: Skin is warm and dry.  Psychiatric:  She has a normal mood and affect. Her behavior is normal.       Assessment & Plan:  Acute maxillary sinusitis, recurrence not specified - Plan: fluticasone (FLONASE) 50 MCG/ACT nasal spray, amoxicillin-clavulanate (AUGMENTIN) 875-125 MG per tablet  Allergic rhinitis, unspecified allergic rhinitis type - Plan: fluticasone (FLONASE) 50 MCG/ACT nasal spray  Cough - Plan: HYDROcodone-homatropine (HYCODAN) 5-1.5 MG/5ML syrup, benzonatate (TESSALON) 100 MG capsule   [name] is a pleasant 63 yr old female with sinusitis.  Symptoms present x 1 week, +maxillary sinus tenderness and double sickening.  Will treat with Augmentin x 10 days.  Continue Allegra daily.  Add Flonase.  Tessalon and Hycodan if needed for cough.  Push fluids, rest.    Pt to call or RTC if worsening or not improving  E. Natividad Brood MHS, PA-C [facility] 6/28/20152:07 PM

## 2013-09-04 NOTE — Patient Instructions (Signed)
The antibiotic prescribed today is for your present infection only. It is very important to follow the directions for the medication prescribed. Antibiotics are generally given for a specified period of time (7-10 days, for example) to be taken at specific intervals (every 4, 6, 8 or 12 hours). This is necessary to keep the right amount of the medication in the bloodstream. Too much of the medication may cause an adverse reaction, too little may not be completely effective.  To clear your infection completely, continue taking the antibiotic for the full time of treatment, even if you begin to feel better after a few days.  If you miss a dose of the antibiotic, take it as soon as possible. Then go back to your regular dosing schedule. However, don't double up doses.    Take the Augmentin as directed.  Finish the full course even when you start feeling better.  Take with food and water to reduce stomach upset.  Continue taking the Allegra daily for your allergies  Start using the fluticasone (Flonase) 2 sprays each nostril once daily - this will help with congestion/drainage as well as allegies  Benzonatate (Tessalon Perles) every 8 hours if needed for cough  Hycodan syrup if needed for cough at night - will make you sleepy, so be careful with first dose  Plenty of fluids (water is best!) and rest  Please let us know if you are worsening or not improving  Sinusitis Sinusitis is redness, soreness, and swelling (inflammation) of the paranasal sinuses. Paranasal sinuses are air pockets within the bones of your face (beneath the eyes, the middle of the forehead, or above the eyes). In healthy paranasal sinuses, mucus is able to drain out, and air is able to circulate through them by way of your nose. However, when your paranasal sinuses are inflamed, mucus and air can become trapped. This can allow bacteria and other germs to grow and cause infection. Sinusitis can develop quickly and last only a short  time (acute) or continue over a long period (chronic). Sinusitis that lasts for more than 12 weeks is considered chronic.  CAUSES  Causes of sinusitis include:  Allergies.  Structural abnormalities, such as displacement of the cartilage that separates your nostrils (deviated septum), which can decrease the air flow through your nose and sinuses and affect sinus drainage.  Functional abnormalities, such as when the small hairs (cilia) that line your sinuses and help remove mucus do not work properly or are not present. SYMPTOMS  Symptoms of acute and chronic sinusitis are the same. The primary symptoms are pain and pressure around the affected sinuses. Other symptoms include:  Upper toothache.  Earache.  Headache.  Bad breath.  Decreased sense of smell and taste.  A cough, which worsens when you are lying flat.  Fatigue.  Fever.  Thick drainage from your nose, which often is green and may contain pus (purulent).  Swelling and warmth over the affected sinuses. DIAGNOSIS  Your caregiver will perform a physical exam. During the exam, your caregiver may:  Look in your nose for signs of abnormal growths in your nostrils (nasal polyps).  Tap over the affected sinus to check for signs of infection.  View the inside of your sinuses (endoscopy) with a special imaging device with a light attached (endoscope), which is inserted into your sinuses. If your caregiver suspects that you have chronic sinusitis, one or more of the following tests may be recommended:  Allergy tests.  Nasal culture--A sample of mucus is  taken from your nose and sent to a lab and screened for bacteria.  Nasal cytology--A sample of mucus is taken from your nose and examined by your caregiver to determine if your sinusitis is related to an allergy. TREATMENT  Most cases of acute sinusitis are related to a viral infection and will resolve on their own within 10 days. Sometimes medicines are prescribed to help  relieve symptoms (pain medicine, decongestants, nasal steroid sprays, or saline sprays).  However, for sinusitis related to a bacterial infection, your caregiver will prescribe antibiotic medicines. These are medicines that will help kill the bacteria causing the infection.  Rarely, sinusitis is caused by a fungal infection. In theses cases, your caregiver will prescribe antifungal medicine. For some cases of chronic sinusitis, surgery is needed. Generally, these are cases in which sinusitis recurs more than 3 times per year, despite other treatments. HOME CARE INSTRUCTIONS   Drink plenty of water. Water helps thin the mucus so your sinuses can drain more easily.  Use a humidifier.  Inhale steam 3 to 4 times a day (for example, sit in the bathroom with the shower running).  Apply a warm, moist washcloth to your face 3 to 4 times a day, or as directed by your caregiver.  Use saline nasal sprays to help moisten and clean your sinuses.  Take over-the-counter or prescription medicines for pain, discomfort, or fever only as directed by your caregiver. SEEK IMMEDIATE MEDICAL CARE IF:  You have increasing pain or severe headaches.  You have nausea, vomiting, or drowsiness.  You have swelling around your face.  You have vision problems.  You have a stiff neck.  You have difficulty breathing. MAKE SURE YOU:   Understand these instructions.  Will watch your condition.  Will get help right away if you are not doing well or get worse. Document Released: 02/24/2005 Document Revised: 05/19/2011 Document Reviewed: 03/11/2011 [facility] Patient Information 2015 [place], [place]. This information is not intended to replace advice given to you by your health care provider. Make sure you discuss any questions you have with your health care provider.

## 2013-10-08 DIAGNOSIS — I639 Cerebral infarction, unspecified: Secondary | ICD-10-CM

## 2013-10-08 HISTORY — DX: Cerebral infarction, unspecified: I63.9

## 2013-10-10 ENCOUNTER — Ambulatory Visit (INDEPENDENT_AMBULATORY_CARE_PROVIDER_SITE_OTHER): Payer: PRIVATE HEALTH INSURANCE | Admitting: Family Medicine

## 2013-10-10 ENCOUNTER — Emergency Department (HOSPITAL_COMMUNITY): Payer: PRIVATE HEALTH INSURANCE

## 2013-10-10 ENCOUNTER — Encounter (HOSPITAL_COMMUNITY): Payer: Self-pay | Admitting: Emergency Medicine

## 2013-10-10 ENCOUNTER — Inpatient Hospital Stay (HOSPITAL_COMMUNITY)
Admission: EM | Admit: 2013-10-10 | Discharge: 2013-10-12 | DRG: 103 | Disposition: A | Discharge status: Home / Self Care | Payer: PRIVATE HEALTH INSURANCE | Attending: Family Medicine | Admitting: Family Medicine

## 2013-10-10 ENCOUNTER — Ambulatory Visit: Payer: PRIVATE HEALTH INSURANCE | Admitting: Nurse Practitioner

## 2013-10-10 VITALS — BP 118/81 | HR 120 | Temp 97.9°F | Resp 18

## 2013-10-10 DIAGNOSIS — K219 Gastro-esophageal reflux disease without esophagitis: Secondary | ICD-10-CM | POA: Diagnosis present

## 2013-10-10 DIAGNOSIS — Z820 Family history of epilepsy and other diseases of the nervous system: Secondary | ICD-10-CM

## 2013-10-10 DIAGNOSIS — G43909 Migraine, unspecified, not intractable, without status migrainosus: Secondary | ICD-10-CM

## 2013-10-10 DIAGNOSIS — G459 Transient cerebral ischemic attack, unspecified: Secondary | ICD-10-CM | POA: Diagnosis present

## 2013-10-10 DIAGNOSIS — N189 Chronic kidney disease, unspecified: Secondary | ICD-10-CM | POA: Diagnosis present

## 2013-10-10 DIAGNOSIS — I129 Hypertensive chronic kidney disease with stage 1 through stage 4 chronic kidney disease, or unspecified chronic kidney disease: Secondary | ICD-10-CM | POA: Diagnosis present

## 2013-10-10 DIAGNOSIS — N390 Urinary tract infection, site not specified: Secondary | ICD-10-CM

## 2013-10-10 DIAGNOSIS — G43809 Other migraine, not intractable, without status migrainosus: Secondary | ICD-10-CM

## 2013-10-10 DIAGNOSIS — G43109 Migraine with aura, not intractable, without status migrainosus: Principal | ICD-10-CM | POA: Diagnosis present

## 2013-10-10 DIAGNOSIS — I10 Essential (primary) hypertension: Secondary | ICD-10-CM

## 2013-10-10 DIAGNOSIS — R200 Anesthesia of skin: Secondary | ICD-10-CM

## 2013-10-10 DIAGNOSIS — Z87891 Personal history of nicotine dependence: Secondary | ICD-10-CM

## 2013-10-10 DIAGNOSIS — Z66 Do not resuscitate: Secondary | ICD-10-CM | POA: Diagnosis present

## 2013-10-10 DIAGNOSIS — IMO0002 Reserved for concepts with insufficient information to code with codable children: Secondary | ICD-10-CM

## 2013-10-10 DIAGNOSIS — R29898 Other symptoms and signs involving the musculoskeletal system: Secondary | ICD-10-CM

## 2013-10-10 DIAGNOSIS — R209 Unspecified disturbances of skin sensation: Secondary | ICD-10-CM

## 2013-10-10 DIAGNOSIS — G588 Other specified mononeuropathies: Secondary | ICD-10-CM

## 2013-10-10 DIAGNOSIS — N179 Acute kidney failure, unspecified: Secondary | ICD-10-CM | POA: Diagnosis present

## 2013-10-10 DIAGNOSIS — G458 Other transient cerebral ischemic attacks and related syndromes: Secondary | ICD-10-CM

## 2013-10-10 DIAGNOSIS — Z91013 Allergy to seafood: Secondary | ICD-10-CM

## 2013-10-10 DIAGNOSIS — F410 Panic disorder [episodic paroxysmal anxiety] without agoraphobia: Secondary | ICD-10-CM | POA: Diagnosis present

## 2013-10-10 DIAGNOSIS — E785 Hyperlipidemia, unspecified: Secondary | ICD-10-CM | POA: Diagnosis present

## 2013-10-10 LAB — URINE MICROSCOPIC-ADD ON

## 2013-10-10 LAB — CBC
HCT: 40.6 % (ref 36.0–46.0)
Hemoglobin: 13.3 g/dL (ref 12.0–15.0)
MCH: 28.7 pg (ref 26.0–34.0)
MCHC: 32.8 g/dL (ref 30.0–36.0)
MCV: 87.5 fL (ref 78.0–100.0)
Platelets: 232 10*3/uL (ref 150–400)
RBC: 4.64 MIL/uL (ref 3.87–5.11)
RDW: 14.7 % (ref 11.5–15.5)
WBC: 7.4 10*3/uL (ref 4.0–10.5)

## 2013-10-10 LAB — COMPREHENSIVE METABOLIC PANEL
ALT: 20 U/L (ref 0–35)
AST: 17 U/L (ref 0–37)
Albumin: 4 g/dL (ref 3.5–5.2)
Alkaline Phosphatase: 108 U/L (ref 39–117)
Anion gap: 15 (ref 5–15)
BUN: 16 mg/dL (ref 6–23)
CO2: 25 mEq/L (ref 19–32)
Calcium: 8.9 mg/dL (ref 8.4–10.5)
Chloride: 102 mEq/L (ref 96–112)
Creatinine, Ser: 1.85 mg/dL — ABNORMAL HIGH (ref 0.50–1.10)
GFR calc Af Amer: 33 mL/min — ABNORMAL LOW (ref >90)
GFR calc non Af Amer: 28 mL/min — ABNORMAL LOW (ref >90)
Glucose, Bld: 98 mg/dL (ref 70–99)
Potassium: 4 mEq/L (ref 3.7–5.3)
Sodium: 142 mEq/L (ref 137–147)
Total Bilirubin: 0.2 mg/dL — ABNORMAL LOW (ref 0.3–1.2)
Total Protein: 7.1 g/dL (ref 6.0–8.3)

## 2013-10-10 LAB — I-STAT CHEM 8, ED
BUN: 16 mg/dL (ref 6–23)
Calcium, Ion: 1.13 mmol/L (ref 1.13–1.30)
Chloride: 104 mEq/L (ref 96–112)
Creatinine, Ser: 2 mg/dL — ABNORMAL HIGH (ref 0.50–1.10)
Glucose, Bld: 101 mg/dL — ABNORMAL HIGH (ref 70–99)
HCT: 42 % (ref 36.0–46.0)
Hemoglobin: 14.3 g/dL (ref 12.0–15.0)
Potassium: 4 mEq/L (ref 3.7–5.3)
Sodium: 139 mEq/L (ref 137–147)
TCO2: 23 mmol/L (ref 0–100)

## 2013-10-10 LAB — URINALYSIS, ROUTINE W REFLEX MICROSCOPIC
Bilirubin Urine: NEGATIVE
Glucose, UA: NEGATIVE mg/dL
Hgb urine dipstick: NEGATIVE
Ketones, ur: NEGATIVE mg/dL
Nitrite: POSITIVE — AB
Protein, ur: NEGATIVE mg/dL
Specific Gravity, Urine: 1.016 (ref 1.005–1.030)
Urobilinogen, UA: 0.2 mg/dL (ref 0.0–1.0)
pH: 6 (ref 5.0–8.0)

## 2013-10-10 LAB — APTT: aPTT: 31 seconds (ref 24–37)

## 2013-10-10 LAB — DIFFERENTIAL
Basophils Absolute: 0 10*3/uL (ref 0.0–0.1)
Basophils Relative: 0 % (ref 0–1)
Eosinophils Absolute: 0 10*3/uL (ref 0.0–0.7)
Eosinophils Relative: 0 % (ref 0–5)
Lymphocytes Relative: 22 % (ref 12–46)
Lymphs Abs: 1.7 10*3/uL (ref 0.7–4.0)
Monocytes Absolute: 0.4 10*3/uL (ref 0.1–1.0)
Monocytes Relative: 5 % (ref 3–12)
Neutro Abs: 5.3 10*3/uL (ref 1.7–7.7)
Neutrophils Relative %: 73 % (ref 43–77)

## 2013-10-10 LAB — ETHANOL: Alcohol, Ethyl (B): <11 mg/dL (ref 0–11)

## 2013-10-10 LAB — I-STAT TROPONIN, ED
Troponin i, poc: 0 ng/mL (ref 0.00–0.08)
Troponin i, poc: 0 ng/mL (ref 0.00–0.08)

## 2013-10-10 LAB — RAPID URINE DRUG SCREEN, HOSP PERFORMED
Amphetamines: NOT DETECTED
Barbiturates: NOT DETECTED
Benzodiazepines: POSITIVE — AB
Cocaine: NOT DETECTED
Opiates: NOT DETECTED
Tetrahydrocannabinol: NOT DETECTED

## 2013-10-10 LAB — TROPONIN I: Troponin I: <0.3 ng/mL (ref <0.30)

## 2013-10-10 LAB — CBG MONITORING, ED: Glucose-Capillary: 98 mg/dL (ref 70–99)

## 2013-10-10 LAB — PROTIME-INR
INR: 1.03 (ref 0.00–1.49)
Prothrombin Time: 13.5 seconds (ref 11.6–15.2)

## 2013-10-10 LAB — I-STAT CG4 LACTIC ACID, ED: Lactic Acid, Venous: 0.84 mmol/L (ref 0.5–2.2)

## 2013-10-10 MED ORDER — GABAPENTIN 300 MG PO CAPS
300.0000 mg | ORAL_CAPSULE | Freq: Every day | ORAL | Status: DC
  Administered 2013-10-10 – 2013-10-11 (×2): 300 mg via ORAL
  Filled 2013-10-10 (×2): qty 1

## 2013-10-10 MED ORDER — FUROSEMIDE 20 MG PO TABS: 20.0000 mg | ORAL_TABLET | Freq: Every day | ORAL | Status: DC

## 2013-10-10 MED ORDER — VALPROATE SODIUM 500 MG/5ML IV SOLN
500.0000 mg | Freq: Once | INTRAVENOUS | Status: AC
  Administered 2013-10-10: 500 mg via INTRAVENOUS
  Filled 2013-10-10: qty 5

## 2013-10-10 MED ORDER — SODIUM CHLORIDE 0.9 % IV BOLUS (SEPSIS)
1000.0000 mL | Freq: Once | INTRAVENOUS | Status: AC
  Administered 2013-10-10: 1000 mL via INTRAVENOUS

## 2013-10-10 MED ORDER — CILIDINIUM-CHLORDIAZEPOXIDE 2.5-5 MG PO CAPS
1.0000 | ORAL_CAPSULE | Freq: Every day | ORAL | Status: DC
  Administered 2013-10-11 – 2013-10-12 (×2): 1 via ORAL
  Filled 2013-10-10 (×3): qty 1

## 2013-10-10 MED ORDER — PROCHLORPERAZINE EDISYLATE 5 MG/ML IJ SOLN
10.0000 mg | Freq: Four times a day (QID) | INTRAMUSCULAR | Status: DC | PRN
  Filled 2013-10-10: qty 2

## 2013-10-10 MED ORDER — ONDANSETRON HCL 4 MG/2ML IJ SOLN: 4.0000 mg | Freq: Three times a day (TID) | INTRAMUSCULAR | Status: AC | PRN

## 2013-10-10 MED ORDER — CLOPIDOGREL BISULFATE 75 MG PO TABS
75.0000 mg | ORAL_TABLET | Freq: Every day | ORAL | Status: DC
  Administered 2013-10-10 – 2013-10-12 (×3): 75 mg via ORAL
  Filled 2013-10-10 (×3): qty 1

## 2013-10-10 MED ORDER — DOXEPIN HCL 100 MG PO CAPS
100.0000 mg | ORAL_CAPSULE | Freq: Every day | ORAL | Status: DC
  Administered 2013-10-10 – 2013-10-11 (×2): 100 mg via ORAL
  Filled 2013-10-10 (×4): qty 1

## 2013-10-10 MED ORDER — HEPARIN SODIUM (PORCINE) 5000 UNIT/ML IJ SOLN
5000.0000 [IU] | Freq: Three times a day (TID) | INTRAMUSCULAR | Status: DC
  Administered 2013-10-10 – 2013-10-12 (×6): 5000 [IU] via SUBCUTANEOUS
  Filled 2013-10-10 (×6): qty 1

## 2013-10-10 MED ORDER — SODIUM CHLORIDE 0.9 % IV SOLN
INTRAVENOUS | Status: DC
  Administered 2013-10-10: 250 mL via INTRAVENOUS
  Administered 2013-10-11: 1000 mL via INTRAVENOUS

## 2013-10-10 MED ORDER — IOHEXOL 350 MG/ML SOLN
50.0000 mL | Freq: Once | INTRAVENOUS | Status: AC | PRN
Start: 2013-10-10 — End: 2013-10-10
  Administered 2013-10-10: 50 mL via INTRAVENOUS

## 2013-10-10 MED ORDER — NITROGLYCERIN 0.4 MG SL SUBL: 0.4000 mg | SUBLINGUAL_TABLET | SUBLINGUAL | Status: DC | PRN

## 2013-10-10 MED ORDER — LACTATED RINGERS IV SOLN: INTRAVENOUS | Status: DC

## 2013-10-10 MED ORDER — SODIUM CHLORIDE 0.9 % IJ SOLN: 3.0000 mL | INTRAMUSCULAR | Status: DC | PRN

## 2013-10-10 MED ORDER — DIPHENHYDRAMINE HCL 50 MG/ML IJ SOLN
25.0000 mg | Freq: Once | INTRAMUSCULAR | Status: AC
  Administered 2013-10-10: 25 mg via INTRAVENOUS
  Filled 2013-10-10: qty 1

## 2013-10-10 MED ORDER — TOPIRAMATE 100 MG PO TABS
100.0000 mg | ORAL_TABLET | Freq: Every day | ORAL | Status: DC
  Administered 2013-10-10 – 2013-10-11 (×2): 100 mg via ORAL
  Filled 2013-10-10 (×2): qty 1

## 2013-10-10 MED ORDER — SODIUM CHLORIDE 0.9 % IJ SOLN
3.0000 mL | Freq: Two times a day (BID) | INTRAMUSCULAR | Status: DC
  Administered 2013-10-11 – 2013-10-12 (×2): 3 mL via INTRAVENOUS

## 2013-10-10 MED ORDER — RAMIPRIL 5 MG PO CAPS
5.0000 mg | ORAL_CAPSULE | Freq: Every day | ORAL | Status: DC
  Administered 2013-10-11 – 2013-10-12 (×2): 5 mg via ORAL
  Filled 2013-10-10 (×4): qty 1

## 2013-10-10 MED ORDER — PANTOPRAZOLE SODIUM 40 MG PO TBEC
40.0000 mg | DELAYED_RELEASE_TABLET | Freq: Every day | ORAL | Status: DC
  Administered 2013-10-11 – 2013-10-12 (×2): 40 mg via ORAL
  Filled 2013-10-10 (×2): qty 1

## 2013-10-10 MED ORDER — STROKE: EARLY STAGES OF RECOVERY BOOK
Freq: Once | Status: AC
  Administered 2013-10-10: 1
  Filled 2013-10-10: qty 1

## 2013-10-10 MED ORDER — ACETAMINOPHEN 325 MG PO TABS
650.0000 mg | ORAL_TABLET | ORAL | Status: DC | PRN
  Administered 2013-10-11: 650 mg via ORAL
  Filled 2013-10-10: qty 2

## 2013-10-10 MED ORDER — SODIUM CHLORIDE 0.9 % IV SOLN: 250.0000 mL | INTRAVENOUS | Status: DC | PRN

## 2013-10-10 MED ORDER — DEXTROSE 5 % IV SOLN
1.0000 g | Freq: Once | INTRAVENOUS | Status: AC
  Administered 2013-10-10: 1 g via INTRAVENOUS
  Filled 2013-10-10: qty 10

## 2013-10-10 MED ORDER — ASPIRIN EC 325 MG PO TBEC: 325.0000 mg | DELAYED_RELEASE_TABLET | Freq: Once | ORAL | Status: DC

## 2013-10-10 NOTE — ED Notes (Signed)
Pt monitored by pulse ox, bp cuff, and 12-lead. 

## 2013-10-10 NOTE — H&P (Signed)
[facility] Admission History and Physical Service Pager: [phone]  Patient name: [name] Medical record number: [id number] Date of birth: [birth year] Age: 63 y.o. Gender: female  Primary Care Provider: Vidal Schwalbe, MD Consultants: Neurology Code Status: DNR  Chief Complaint: R sided weakness and headache  Assessment and Plan: TABYTHA GRADILLAS is a 63 y.o. female presenting with right sided weakness and headache . PMH is significant for migraines without aura, HTN, occipital neuralgia, GERD, CKD.  1. R sided weakness: (SAH vs CVA vs TIA vs Vertebral artery stenosis/dz vs migraine related). CT head w/out contrast, CTA head and neck, : Overall, normal.  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 Symptoms improved but still with migraine headache, residual right sided symptoms. -MRI w/out contrast pending resolution of headache if symptoms still present -Seen by Neurology, appreciate recs: suspect migraine related -Neuro checks q2 hours -ASA 325, however patient declined due to her CKD. Plavix 75mg  daily. -Additional labs: lipid panel tomorrow AM, Hgb A1c -2D echo per TIA order set, no history of echo in chart  2. Migraine Headache: (CVA vs TIA vs Atypical migraine). Received valproate 500mg  in ED, benadryl.  -Tylenol PRN -Continue home dose of topamax, doxepin, gabapentin -zofran PRN for nausea/vomiting  3. HTN, stable. BP 128/77 -Continue home altace, lasix -monitor vitals per floor protocol  4. UTI: UA with positive nitrites and moderate leukocytes, many bacteria on microscopy -Rocephin administered in ED -Abx pending culture  5. Chest pain w/ SOB ?panic attack/anxiety. EKG with sinus tachycardia but otherwise appears similar compared to 2014. i-stat troponin negative. CXR with underlying emphysema but no infiltrate.  -serial troponins -labs per above -2d echo per above -nitro SL as needed -EKG in the  AM  6. GERD -Continue Nexium  7. CKD. Cr 2.00, most recent in chart was 1.04 in 2008 - gentle IV hydration - repeat bmet in the AM  FEN/GI: NPO pending swallow eval. NS @ 50cc/hr Prophylaxis: Sub-q heparin  Disposition: Admit to [place]  History of Present Illness: [name] is a 63 y.o. female presenting with headache, right sided weakness, UTI and intermittent CP/SOB.  Developed headache this morning around 11am. She has a history of migraine headaches, but this was slightly different with radiation typically going from her left side of face to right side, but today radiated to the back of her head. Around 12pm she stood up after using the computer and fell while having a room spinning sensation that lasted for 15 minutes. She fell on the right side of her butt, did not hit her head. After she fell she felt tongue swelling and numbness on her right side of her face, and developed a hoarse voice. After the dizziness stopped she drove herself to urgent care and while talking with the doctor also had difficulty with getting her words out. Denies double vision  Has urinary urgency and increased frequency x 2 days, no burning, no change in urine color/blood in urine, no abdominal pain.  Admits to urinary odor.  While in the ambulance going to the ED she developed shortness of breath and an intermittent sharp left sided chest pain since being in ED.  Review Of Systems: Per HPI with the following additions: negative Otherwise 12 point review of systems was performed and was unremarkable.  Patient Active Problem List   Diagnosis Date Noted  . Migraine variant 10/10/2013  . Disturbance of skin sensation 10/10/2013  . UTI (lower urinary tract infection)  10/10/2013  . HTN (hypertension) 07/18/2013  . 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 Neuralgia, neuritis, and radiculitis, unspecified 02/04/2012  . Migraine without aura, with intractable migraine, so stated,  without mention of status migrainosus 02/04/2012  . 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 Myofascial muscle pain 07/15/2011   Past Medical History: Past Medical History  Diagnosis Date  . Hyperlipidemia   . Chronic kidney disease   . Migraine headache   . GERD (gastroesophageal reflux disease)   . Occipital neuralgia     Left   Past Surgical History: Past Surgical History  Procedure Laterality Date  . Abdominal hysterectomy    . Knee surgery    . Carpal tunnel release    . Elbow surgery    . Lung surgery    . Bladder repair    . Cervical spine surgery     Social History: History  Substance Use Topics  . Smoking status: Former Smoker -- 0.30 packs/day    Types: Cigarettes    Quit date: 10/31/2012  . Smokeless tobacco: Never Used  . Alcohol Use: No   Additional social history: none Please also refer to relevant sections of EMR.  Family History: Family History  Problem Relation Age of Onset  . Cancer Mother 83    breast  . Alzheimer's disease Mother   . Cancer Brother   . Hyperlipidemia Brother    Allergies and Medications: Allergies  Allergen Reactions  . Shellfish Allergy Anaphylaxis and Other (See Comments)    All seafood   No current facility-administered medications on file prior to encounter.   Current Outpatient Prescriptions on File Prior to Encounter  Medication Sig Dispense Refill  . clidinium-chlordiazePOXIDE (LIBRAX) 2.5-5 MG per capsule Take 1 capsule by mouth daily.       Marland Kitchen esomeprazole (NEXIUM) 40 MG capsule Take 40 mg by mouth daily before breakfast.      . estradiol (VIVELLE-DOT) 0.025 MG/24HR Place 1 patch onto the skin every other day.       . furosemide (LASIX) 20 MG tablet Take 20 mg by mouth daily.       . ramipril (ALTACE) 5 MG tablet Take 5 mg by mouth daily.      Marland Kitchen topiramate (TOPAMAX) 100 MG tablet Take 1 tablet (100 mg total) by mouth at bedtime.  90 tablet  2    Objective: BP 128/77  Pulse 101  Temp(Src) 98.2 F (36.8 C)  (Oral)  Resp 16  Ht 5' 4.17" (1.63 m)  Wt 163 lb 9.3 oz (74.2 kg)  BMI 27.93 kg/m2  SpO2 100% Exam: General: well nourished female lying in bed, NAD HEENT: [place]/AT, PERRL, MMM  Cardiovascular: S1S2, RRR, no rubs/murmurs. 2+ radial and PT pulses. Chest wall mildly tender to palpation. Respiratory: CTAB, no wheeze, rhonchi, rales Abdomen: soft, NT/ND, +BS x4 Extremities: no edema or cyanosis, wwp. 4/5 strength R UE and LE, 5/5 L UE and LE, WWP Skin: dry, intact Neuro: 2-12 grossly intact, speech normal, neg babinski, no myoclonus  Labs and Imaging: CBC BMET   Recent Labs Lab 10/10/13 1521 10/10/13 1547  WBC 7.4  --   HGB 13.3 14.3  HCT 40.6 42.0  PLT 232  --     Recent Labs Lab 10/10/13 1521 10/10/13 1547  NA 142 139  K 4.0 4.0  CL 102 104  CO2 25  --   BUN 16 16  CREATININE 1.85* 2.00*  GLUCOSE 98 101*  CALCIUM 8.9  --      -CT head  w/out contrast, CTA head and neck (8/3) : Overall, normal.  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 -CXR (8/3): underlying emphysema w/ mild scarring of L base.  No edema or consolidation.    Janora Norlander, DO 10/10/2013, 6:45 PM PGY-1, [facility] pager: [phone], text pages welcome  I have read and agree with the note above by Dr. Lajuana Ripple, in addition my changes are noted in blue.  Tawanna Sat, MD 10/10/2013, 8:41 PM PGY-2, [place]

## 2013-10-10 NOTE — Code Documentation (Signed)
63yo female arriving to [facility] via [place].  Patient reports acute onset headache and right sided weakness at 1100. Patient has a  H/o migraines, but weakness is not typical for her.  Patient went to an urgent care who called EMS.  Code stroke called in the ED.  Patient taken to CT scan and CTA completed. Initial NIHSS 3 for right arm and leg drift and right sided decreased sensation.  See documentation for details and times.  Patient is outside the window to treat with tPA.  MD aware that CTA complete.  Patient is not a candidate for IR per Dr. Doy Mince. Bedside handoff with ED RN Hassan Rowan.

## 2013-10-10 NOTE — Consult Note (Addendum)
Referring Physician: Regenia Skeeter    Chief Complaint: Code stroke  HPI:                                                                                                                                         [name] is an 63 y.o. female who was sent to [facility] from [facility] for sudden onset left neck pain associated with  right sided weakness/ decreased sensation. Code stroke was called. At time of arrival to ED patient was out of window for IV tPA but not IA tPA. Initial CT head was negative.  CTA was obtained and patient was brought back to ED. Currently patient states her HA starts in the left trapezial region, goes up her neck to the back of her head. She mentions usually her pain goes to the front of her head, however, what worries her today is that she feels her right arm and leg feel weak with decreased sensation on the right aspect of her face, arm and leg.   Date last known well: Date: 10/10/2013 Time last known well: Time: 11:00 tPA Given: No: out of window NIHSS: 3   Past Medical History  Diagnosis Date  . Hyperlipidemia   . Chronic kidney disease   . Migraine headache   . GERD (gastroesophageal reflux disease)   . Occipital neuralgia     Left    Past Surgical History  Procedure Laterality Date  . Abdominal hysterectomy    . Knee surgery    . Carpal tunnel release    . Elbow surgery    . Lung surgery    . Bladder repair    . Cervical spine surgery      Family History  Problem Relation Age of Onset  . Cancer Mother 30    breast  . Alzheimer's disease Mother   . Cancer Brother   . Hyperlipidemia Brother    Social History:  reports that she quit smoking about 11 months ago. Her smoking use included Cigarettes. She smoked 0.30 packs per day. She has never used smokeless tobacco. She reports that she does not drink alcohol or use illicit drugs.  Allergies:  Allergies  Allergen Reactions  . Shellfish Allergy Anaphylaxis and Other (See Comments)    All seafood     Medications:  Current Facility-Administered Medications  Medication Dose Route Frequency Provider Last Rate Last Dose  . prochlorperazine (COMPAZINE) injection 10 mg  10 mg Intravenous Q6H PRN Deno Etienne, MD       Current Outpatient Prescriptions  Medication Sig Dispense Refill  . clidinium-chlordiazePOXIDE (LIBRAX) 2.5-5 MG per capsule Take 1 capsule by mouth daily.       Marland Kitchen doxepin (SINEQUAN) 100 MG capsule Take 100 mg by mouth at bedtime.      Marland Kitchen esomeprazole (NEXIUM) 40 MG capsule Take 40 mg by mouth daily before breakfast.      . estradiol (VIVELLE-DOT) 0.025 MG/24HR Place 1 patch onto the skin every other day.       . fluticasone (FLONASE) 50 MCG/ACT nasal spray Place 2 sprays into both nostrils daily as needed for allergies or rhinitis.      . furosemide (LASIX) 20 MG tablet Take 20 mg by mouth daily.       Marland Kitchen gabapentin (NEURONTIN) 300 MG capsule Take 300 mg by mouth at bedtime.      . lidocaine (LIDODERM) 5 % Place 1 patch onto the skin daily. Remove & Discard patch within 12 hours or as directed by MD      . ramipril (ALTACE) 5 MG tablet Take 5 mg by mouth daily.      Marland Kitchen topiramate (TOPAMAX) 100 MG tablet Take 1 tablet (100 mg total) by mouth at bedtime.  90 tablet  2     ROS:                                                                                                                                       History obtained from the patient  General ROS: negative for - chills, fatigue, fever, night sweats, weight gain or weight loss Psychological ROS: negative for - behavioral disorder, hallucinations, memory difficulties, mood swings or suicidal ideation Ophthalmic ROS: negative for - blurry vision, double vision, eye pain or loss of vision ENT ROS: negative for - epistaxis, nasal discharge, oral lesions, sore throat, tinnitus or vertigo Allergy and  Immunology ROS: negative for - hives or itchy/watery eyes Hematological and Lymphatic ROS: negative for - bleeding problems, bruising or swollen lymph nodes Endocrine ROS: negative for - galactorrhea, hair pattern changes, polydipsia/polyuria or temperature intolerance Respiratory ROS: negative for - cough, hemoptysis, shortness of breath or wheezing Cardiovascular ROS: negative for - chest pain, dyspnea on exertion, edema or irregular heartbeat Gastrointestinal ROS: negative for - abdominal pain, diarrhea, hematemesis, nausea/vomiting or stool incontinence Genito-Urinary ROS: negative for - dysuria, hematuria, incontinence or urinary frequency/urgency Musculoskeletal ROS: negative for - joint swelling or muscular weakness Neurological ROS: as noted in HPI Dermatological ROS: negative for rash and skin lesion changes  Neurologic Examination:  Blood pressure 126/79, pulse 105, temperature 98.2 F (36.8 C), temperature source Oral, resp. rate 23, height 5' 4.17" (1.63 m), weight 74.2 kg (163 lb 9.3 oz), SpO2 100.00%.   Mental Status: Alert, oriented, thought content appropriate.  Speech fluent without evidence of aphasia.  Able to follow 3 step commands without difficulty. Cranial Nerves: II: Discs flat bilaterally; Visual fields grossly normal, pupils equal, round, reactive to light and accommodation III,IV, VI: ptosis not present, extra-ocular motions intact bilaterally V,VII: smile symmetric, facial light touch sensation decreased on the right aspect of her face, splitting midline to both light touch and vibration.  VIII: hearing normal bilaterally IX,X: gag reflex present XI: bilateral shoulder shrug XII: midline tongue extension without atrophy or fasciculations  Motor: Right : Upper extremity   5/5    Left:     Upper extremity   5/5 (give way weakness)  Lower extremity   5/5     Lower  extremity   5/5 (give way weakness) Tone and bulk:normal tone throughout; no atrophy noted Sensory: Pinprick and light touch  Decreased on the right side--splitting midline from sternum to abdomen.  Deep Tendon Reflexes:  Right: Upper Extremity   Left: Upper extremity   biceps (C-5 to C-6) 2/4   biceps (C-5 to C-6) 2/4 tricep (C7) 2/4    triceps (C7) 2/4 Brachioradialis (C6) 2/4  Brachioradialis (C6) 2/4  Lower Extremity Lower Extremity  quadriceps (L-2 to L-4) 2/4   quadriceps (L-2 to L-4) 2/4 Achilles (S1) 2/4   Achilles (S1) 2/4  Plantars: Right: downgoing   Left: downgoing Cerebellar: normal finger-to-nose,  normal heel-to-shin test Gait: not tested CV: pulses palpable throughout    Lab Results: Basic Metabolic Panel:  Recent Labs Lab 10/10/13 1521 10/10/13 1547  NA 142 139  K 4.0 4.0  CL 102 104  CO2 25  --   GLUCOSE 98 101*  BUN 16 16  CREATININE 1.85* 2.00*  CALCIUM 8.9  --     Liver Function Tests:  Recent Labs Lab 10/10/13 1521  AST 17  ALT 20  ALKPHOS 108  BILITOT 0.2*  PROT 7.1  ALBUMIN 4.0   No results found for this basename: LIPASE, AMYLASE,  in the last 168 hours No results found for this basename: AMMONIA,  in the last 168 hours  CBC:  Recent Labs Lab 10/10/13 1521 10/10/13 1547  WBC 7.4  --   NEUTROABS 5.3  --   HGB 13.3 14.3  HCT 40.6 42.0  MCV 87.5  --   PLT 232  --     Cardiac Enzymes: No results found for this basename: CKTOTAL, CKMB, CKMBINDEX, TROPONINI,  in the last 168 hours  Lipid Panel: No results found for this basename: CHOL, TRIG, HDL, CHOLHDL, VLDL, LDLCALC,  in the last 168 hours  CBG:  Recent Labs Lab 10/10/13 1534  [place] 98    Microbiology: Results for orders placed in visit on 07/18/13  URINE CULTURE     Status: None   Collection Time    07/18/13  3:03 PM      Result Value Ref Range Status   Culture ESCHERICHIA COLI   Final   Colony Count >=100,000 COLONIES/ML   Final   Organism ID, Bacteria  ESCHERICHIA COLI   Final    Coagulation Studies:  Recent Labs  10/10/13 1521  LABPROT 13.5  INR 1.03    Imaging: Ct Angio Head W/cm &/or Wo Cm  10/10/2013   CLINICAL DATA:  63 year old female sudden onset  headache and right side weakness. Initial encounter. Code stroke.  EXAM: CT HEAD WITHOUT CONTRAST  CT ANGIOGRAPHY HEAD AND NECK  TECHNIQUE: Multi detector noncontrast CT of the head was performed.  Next, Multidetector CT imaging of the head and neck was performed using the standard protocol during bolus administration of intravenous contrast. Multiplanar CT image reconstructions and MIPs were obtained to evaluate the vascular anatomy. Carotid stenosis measurements (when applicable) are obtained utilizing NASCET criteria, using the distal internal carotid diameter as the denominator.  CONTRAST:  33mL OMNIPAQUE IOHEXOL 350 MG/ML SOLN  COMPARISON:  None.  FINDINGS: CT HEAD FINDINGS  Calvarium intact. Visualized paranasal sinuses and mastoids are clear. Visualized scalp soft tissues are within normal limits. Cerebral volume is within normal limits for age. No midline shift, ventriculomegaly, mass effect, evidence of mass lesion, intracranial hemorrhage or evidence of cortically based acute infarction. Gray-white matter differentiation is within normal limits throughout the brain.  CTA HEAD FINDINGS  No abnormal enhancement of brain parenchyma identified.  VASCULAR FINDINGS:  Major intracranial venous structures appear patent.  Negative intracranial vertebral arteries, the left is mildly dominant. Normal PICA origins. Normal vertebrobasilar junction. Mildly tortuous but otherwise negative basilar artery. Normal SCA origins (the right is incidentally duplicated). PCA origins are within normal limits. Bilateral PCA branches are within normal limits. Posterior communicating arteries are diminutive or absent.  Mild bilateral ICA siphon calcified plaque. No associated ICA siphon stenosis. Ophthalmic artery  origins are within normal limits. Patent carotid termini. MCA and ACA origins are within normal limits.  Diminutive anterior communicating artery. Bilateral ACA branches are within normal limits. Right MCA branches are within normal limits.  Left MCA origin and M1 segments are within normal limits. Patent left MCA bifurcation. No left MCA branch occlusion or stenosis identified.  Review of the MIP images confirms the above findings.  CTA NECK FINDINGS  Negative lung apices. No superior mediastinal lymphadenopathy. Negative thyroid, larynx, pharynx, parapharyngeal spaces, retropharyngeal space, sublingual space, submandibular glands, and parotid glands. Paranasal sinuses are clear. Postoperative changes to the globes, otherwise negative orbits soft tissues. No acute osseous abnormality identified. Sequelae of cervical spine ACDF. Sequelae of left side lower cervical spine laminectomy.  No cervical lymphadenopathy.  VASCULAR FINDINGS:  Mildly bovine arch configuration.  Minimal aortic arch plaque.  Soft and calcified plaque of the brachiocephalic artery without stenosis.  Right CCA origin is patent without stenosis. Negative right CCA proximal to the bifurcation. At the carotid bifurcation there is mostly calcified plaque. Proximal right ICA less than 50 % stenosis with respect to the distal vessel occurs. Mid and distal cervical right ICA are normal.  No hemodynamically significant proximal right subclavian artery stenosis suspected despite plaque. Normal right vertebral artery origin. Negative cervical right vertebral artery.  Normal left CCA origin. Medial soft plaque in the left CCA without hemodynamically significant stenosis. At the left carotid bifurcation there is mostly calcified plaque. No left ICA origin or bulb stenosis. Normal cervical left ICA otherwise.  Mostly calcified plaque in the proximal left subclavian artery, without hemodynamically significant stenosis. Moderate to severe left vertebral artery  origin stenosis (coronal series 503, image 104) appears most likely related soft plaque. The left vertebral artery is patent, with a poststenotic dilatation. The vertebral arteries are fairly codominant in the neck. The cervical left vertebral artery otherwise is normal.  Review of the MIP images confirms the above findings.  IMPRESSION: 1. Carotid atherosclerosis, but no hemodynamically significant carotid stenosis. Negative right vertebral artery. Moderate to severe atherosclerotic stenosis  at the left vertebral artery origin, with otherwise negative left vertebral artery. 2. No intracranial artery stenosis or major circle of Willis branch occlusion. 3.  Normal CT appearance of the brain. Study discussed by telephone with Dr. Alexis Goodell on 10/10/2013 at 16:29 hours.   Electronically Signed   By: Lars Pinks M.D.   On: 10/10/2013 16:30   Ct Head Wo Contrast  10/10/2013   CLINICAL DATA:  63 year old female sudden onset headache and right side weakness. Initial encounter. Code stroke.  EXAM: CT HEAD WITHOUT CONTRAST  CT ANGIOGRAPHY HEAD AND NECK  TECHNIQUE: Multi detector noncontrast CT of the head was performed.  Next, Multidetector CT imaging of the head and neck was performed using the standard protocol during bolus administration of intravenous contrast. Multiplanar CT image reconstructions and MIPs were obtained to evaluate the vascular anatomy. Carotid stenosis measurements (when applicable) are obtained utilizing NASCET criteria, using the distal internal carotid diameter as the denominator.  CONTRAST:  43mL OMNIPAQUE IOHEXOL 350 MG/ML SOLN  COMPARISON:  None.  FINDINGS: CT HEAD FINDINGS  Calvarium intact. Visualized paranasal sinuses and mastoids are clear. Visualized scalp soft tissues are within normal limits. Cerebral volume is within normal limits for age. No midline shift, ventriculomegaly, mass effect, evidence of mass lesion, intracranial hemorrhage or evidence of cortically based acute infarction.  Gray-white matter differentiation is within normal limits throughout the brain.  CTA HEAD FINDINGS  No abnormal enhancement of brain parenchyma identified.  VASCULAR FINDINGS:  Major intracranial venous structures appear patent.  Negative intracranial vertebral arteries, the left is mildly dominant. Normal PICA origins. Normal vertebrobasilar junction. Mildly tortuous but otherwise negative basilar artery. Normal SCA origins (the right is incidentally duplicated). PCA origins are within normal limits. Bilateral PCA branches are within normal limits. Posterior communicating arteries are diminutive or absent.  Mild bilateral ICA siphon calcified plaque. No associated ICA siphon stenosis. Ophthalmic artery origins are within normal limits. Patent carotid termini. MCA and ACA origins are within normal limits.  Diminutive anterior communicating artery. Bilateral ACA branches are within normal limits. Right MCA branches are within normal limits.  Left MCA origin and M1 segments are within normal limits. Patent left MCA bifurcation. No left MCA branch occlusion or stenosis identified.  Review of the MIP images confirms the above findings.  CTA NECK FINDINGS  Negative lung apices. No superior mediastinal lymphadenopathy. Negative thyroid, larynx, pharynx, parapharyngeal spaces, retropharyngeal space, sublingual space, submandibular glands, and parotid glands. Paranasal sinuses are clear. Postoperative changes to the globes, otherwise negative orbits soft tissues. No acute osseous abnormality identified. Sequelae of cervical spine ACDF. Sequelae of left side lower cervical spine laminectomy.  No cervical lymphadenopathy.  VASCULAR FINDINGS:  Mildly bovine arch configuration.  Minimal aortic arch plaque.  Soft and calcified plaque of the brachiocephalic artery without stenosis.  Right CCA origin is patent without stenosis. Negative right CCA proximal to the bifurcation. At the carotid bifurcation there is mostly calcified  plaque. Proximal right ICA less than 50 % stenosis with respect to the distal vessel occurs. Mid and distal cervical right ICA are normal.  No hemodynamically significant proximal right subclavian artery stenosis suspected despite plaque. Normal right vertebral artery origin. Negative cervical right vertebral artery.  Normal left CCA origin. Medial soft plaque in the left CCA without hemodynamically significant stenosis. At the left carotid bifurcation there is mostly calcified plaque. No left ICA origin or bulb stenosis. Normal cervical left ICA otherwise.  Mostly calcified plaque in the proximal left subclavian artery, without hemodynamically  significant stenosis. Moderate to severe left vertebral artery origin stenosis (coronal series 503, image 104) appears most likely related soft plaque. The left vertebral artery is patent, with a poststenotic dilatation. The vertebral arteries are fairly codominant in the neck. The cervical left vertebral artery otherwise is normal.  Review of the MIP images confirms the above findings.  IMPRESSION: 1. Carotid atherosclerosis, but no hemodynamically significant carotid stenosis. Negative right vertebral artery. Moderate to severe atherosclerotic stenosis at the left vertebral artery origin, with otherwise negative left vertebral artery. 2. No intracranial artery stenosis or major circle of Willis branch occlusion. 3.  Normal CT appearance of the brain. Study discussed by telephone with Dr. Alexis Goodell on 10/10/2013 at 16:29 hours.   Electronically Signed   By: Lars Pinks M.D.   On: 10/10/2013 16:30   Ct Angio Neck W/cm &/or Wo/cm  10/10/2013   CLINICAL DATA:  63 year old female sudden onset headache and right side weakness. Initial encounter. Code stroke.  EXAM: CT HEAD WITHOUT CONTRAST  CT ANGIOGRAPHY HEAD AND NECK  TECHNIQUE: Multi detector noncontrast CT of the head was performed.  Next, Multidetector CT imaging of the head and neck was performed using the standard  protocol during bolus administration of intravenous contrast. Multiplanar CT image reconstructions and MIPs were obtained to evaluate the vascular anatomy. Carotid stenosis measurements (when applicable) are obtained utilizing NASCET criteria, using the distal internal carotid diameter as the denominator.  CONTRAST:  23mL OMNIPAQUE IOHEXOL 350 MG/ML SOLN  COMPARISON:  None.  FINDINGS: CT HEAD FINDINGS  Calvarium intact. Visualized paranasal sinuses and mastoids are clear. Visualized scalp soft tissues are within normal limits. Cerebral volume is within normal limits for age. No midline shift, ventriculomegaly, mass effect, evidence of mass lesion, intracranial hemorrhage or evidence of cortically based acute infarction. Gray-white matter differentiation is within normal limits throughout the brain.  CTA HEAD FINDINGS  No abnormal enhancement of brain parenchyma identified.  VASCULAR FINDINGS:  Major intracranial venous structures appear patent.  Negative intracranial vertebral arteries, the left is mildly dominant. Normal PICA origins. Normal vertebrobasilar junction. Mildly tortuous but otherwise negative basilar artery. Normal SCA origins (the right is incidentally duplicated). PCA origins are within normal limits. Bilateral PCA branches are within normal limits. Posterior communicating arteries are diminutive or absent.  Mild bilateral ICA siphon calcified plaque. No associated ICA siphon stenosis. Ophthalmic artery origins are within normal limits. Patent carotid termini. MCA and ACA origins are within normal limits.  Diminutive anterior communicating artery. Bilateral ACA branches are within normal limits. Right MCA branches are within normal limits.  Left MCA origin and M1 segments are within normal limits. Patent left MCA bifurcation. No left MCA branch occlusion or stenosis identified.  Review of the MIP images confirms the above findings.  CTA NECK FINDINGS  Negative lung apices. No superior mediastinal  lymphadenopathy. Negative thyroid, larynx, pharynx, parapharyngeal spaces, retropharyngeal space, sublingual space, submandibular glands, and parotid glands. Paranasal sinuses are clear. Postoperative changes to the globes, otherwise negative orbits soft tissues. No acute osseous abnormality identified. Sequelae of cervical spine ACDF. Sequelae of left side lower cervical spine laminectomy.  No cervical lymphadenopathy.  VASCULAR FINDINGS:  Mildly bovine arch configuration.  Minimal aortic arch plaque.  Soft and calcified plaque of the brachiocephalic artery without stenosis.  Right CCA origin is patent without stenosis. Negative right CCA proximal to the bifurcation. At the carotid bifurcation there is mostly calcified plaque. Proximal right ICA less than 50 % stenosis with respect to the distal vessel occurs.  Mid and distal cervical right ICA are normal.  No hemodynamically significant proximal right subclavian artery stenosis suspected despite plaque. Normal right vertebral artery origin. Negative cervical right vertebral artery.  Normal left CCA origin. Medial soft plaque in the left CCA without hemodynamically significant stenosis. At the left carotid bifurcation there is mostly calcified plaque. No left ICA origin or bulb stenosis. Normal cervical left ICA otherwise.  Mostly calcified plaque in the proximal left subclavian artery, without hemodynamically significant stenosis. Moderate to severe left vertebral artery origin stenosis (coronal series 503, image 104) appears most likely related soft plaque. The left vertebral artery is patent, with a poststenotic dilatation. The vertebral arteries are fairly codominant in the neck. The cervical left vertebral artery otherwise is normal.  Review of the MIP images confirms the above findings.  IMPRESSION: 1. Carotid atherosclerosis, but no hemodynamically significant carotid stenosis. Negative right vertebral artery. Moderate to severe atherosclerotic stenosis at  the left vertebral artery origin, with otherwise negative left vertebral artery. 2. No intracranial artery stenosis or major circle of Willis branch occlusion. 3.  Normal CT appearance of the brain. Study discussed by telephone with Dr. Alexis Goodell on 10/10/2013 at 16:29 hours.   Electronically Signed   By: Lars Pinks M.D.   On: 10/10/2013 16:30     Etta Quill PA-C [facility] [phone]  10/10/2013, 5:03 PM  Patient seen and examined.  Clinical course and management discussed.  Necessary edits performed.  I agree with the above.  Assessment and plan of care developed and discussed below.     Assessment: 63 y.o. female presenting the ED with HA associated with right sided weakness and decreased sensation. Neurological examination has multiple functional features.  NIHSS 3. She is outside of the time window for tPA and has an NIHSS that does not make her a candidate for IR.  Head CT reviewed and shows no acute changes.  CTA reveals a moderate to severe stenosis at the origin of the left vertebral artery with good flow beyond the stenosis and no evidence of large vessel stenosis. Symptoms likely secondary complicated migraine.  Stroke Risk Factors - hypertension  Recommendations:  1) Analgesia for HA 2) If symptoms not resolved after HA resolution consider MRI head without contrast, otherwise no further work up recommended.   3) Plavix 75 mg daily.  Patient unable to take ASA  Case discussed with Dr. Franchot Erichsen, MD [facility] [phone]  10/10/2013  5:08 PM

## 2013-10-10 NOTE — ED Notes (Signed)
Right side HA that started at 11am. Hx of migraines. Sent from [facility] for further eval. Pt alert and oriented. No facial droop or arm drift.

## 2013-10-10 NOTE — ED Notes (Signed)
Patient reports she cannot take Aspirin due to kidney function. Dr. Doy Mince notified.

## 2013-10-10 NOTE — Progress Notes (Signed)
[facility] [address] [place] [address] [phone]  Date:  10/10/2013   Name:  [name]   DOB:  Dec 21, 1950   MRN:  [id number]  PCP:  Vidal Schwalbe, MD    Chief Complaint: No chief complaint on file.   History of Present Illness:  [name] is a 63 y.o. very pleasant female patient who presents with the following:  Brought back urgently because "I think I may be having a mini- stroke."  She notes a numb and "drawing up" feeling in the right side of her face.  She also notes sore throat vs pain in the right side of her neck and the right side of her head.  States she has had TIA in the past "because of my migraines."  She has noted some difficulty speaking- it is hard for her to "coordinate" her speech.    She first noted these sx around 11 am today; the sx were not present when she first woke up  She is a pt of Dr. Jannifer Franklin.  She states she has never had a TIA that bothered her like this in past- she has never been hospitalized.   No CP, no abdominal pain.   She notes a severe posterior HA.    Patient Active Problem List   Diagnosis Date Noted  . HTN (hypertension) 07/18/2013  . 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 Neuralgia, neuritis, and radiculitis, unspecified 02/04/2012  . Migraine without aura, with intractable migraine, so stated, without mention of status migrainosus 02/04/2012  . 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 Myofascial muscle pain 07/15/2011    Past Medical History  Diagnosis Date  . Hyperlipidemia   . Chronic kidney disease   . Migraine headache   . GERD (gastroesophageal reflux disease)   . Occipital neuralgia     Left    Past Surgical History  Procedure Laterality Date  . Abdominal hysterectomy    . Knee surgery    . Carpal tunnel release    . Elbow surgery    . Lung surgery    . Bladder repair    . Cervical spine surgery      History  Substance Use Topics  . Smoking status: Former Smoker  -- 0.30 packs/day    Types: Cigarettes    Quit date: 10/31/2012  . Smokeless tobacco: Never Used  . Alcohol Use: No    Family History  Problem Relation Age of Onset  . Cancer Mother 50    breast  . Alzheimer's disease Mother   . Cancer Brother   . Hyperlipidemia Brother     Allergies  Allergen Reactions  . Shellfish Allergy     All seafood    Medication list has been reviewed and updated.  Current Outpatient Prescriptions on File Prior to Visit  Medication Sig Dispense Refill  . amoxicillin-clavulanate (AUGMENTIN) 875-125 MG per tablet Take 1 tablet by mouth 2 (two) times daily.  20 tablet  0  . benzonatate (TESSALON) 100 MG capsule Take 1-2 capsules (100-200 mg total) by mouth 3 (three) times daily as needed for cough.  40 capsule  0  . clidinium-chlordiazePOXIDE (LIBRAX) 2.5-5 MG per capsule Take 1 capsule by mouth 3 (three) times daily as needed.      . doxepin (SINEQUAN) 100 MG capsule Take 2 capsules (200 mg total) by mouth at bedtime.  180 capsule  2  . esomeprazole (NEXIUM) 40 MG capsule Take 40 mg by mouth daily before breakfast.      .  estradiol (VIVELLE-DOT) 0.025 MG/24HR Place 1 patch onto the skin daily.      . fluticasone (FLONASE) 50 MCG/ACT nasal spray Place 2 sprays into both nostrils daily.  16 g  12  . furosemide (LASIX) 20 MG tablet Take 20 mg by mouth daily.       Marland Kitchen gabapentin (NEURONTIN) 300 MG capsule Take 1 capsule (300 mg total) by mouth 3 (three) times daily.  270 capsule  3  . HYDROcodone-homatropine (HYCODAN) 5-1.5 MG/5ML syrup Take 5 mLs by mouth every 8 (eight) hours as needed for cough.  30 mL  0  . lidocaine (LIDODERM) 5 % PLACE 2 PATCHES ONTO THE SKIN EVERY 12HRS.REMOVE & DISCARD PATCH WITHIN 12HRS OR AS DIRECTED  60 patch  11  . ramipril (ALTACE) 5 MG tablet Take 5 mg by mouth daily.      Marland Kitchen topiramate (TOPAMAX) 100 MG tablet Take 1 tablet (100 mg total) by mouth at bedtime.  90 tablet  2   No current facility-administered medications on file  prior to visit.    Review of Systems:  As per HPI- otherwise negative.   Physical Examination: Filed Vitals:   10/10/13 1416  BP: 118/81  Pulse: 120  Temp: 97.9 F (36.6 C)  Resp: 18   There were no vitals filed for this visit. There is no weight on file to calculate BMI. Ideal Body Weight:    GEN: WDWN, NAD, Non-toxic, A & O x 3, overweight, appears nervous.   HEENT: Atraumatic, Normocephalic. Neck supple. No masses, No LAD.  Bilateral TM wnl, oropharynx normal.  PEERL,EOMI.   Ears and Nose: No external deformity. CV: RRR, No M/G/R. No JVD. No thrill. No extra heart sounds. PULM: CTA B, no wheezes, crackles, rhonchi. No retractions. No resp. distress. No accessory muscle use. ABD: S, NT, ND EXTR: No c/c/e NEURO Normal gait.  PSYCH: Normally interactive. Conversant. Not depressed or anxious appearing.  Calm demeanor.  She has complaint of decreased sensation on the right side of her face.  Possible slight dysarthria, but this may also just be nerves.  I am not able to appreciate any facial droop or asymmetry    Assessment and Plan: Right facial numbness - Plan: Insert peripheral IV  Migraine without status migrainosus, not intractable, unspecified migraine type  Pt here with about 3 hours of possible TIA or stroke sx.  Called EMS and transferred to ED for further evaluation or treatment.    Signed Lamar Blinks, MD

## 2013-10-10 NOTE — ED Provider Notes (Signed)
CSN: [id number]     Arrival date & time 10/10/13  1503 History   First MD Initiated Contact with Patient 10/10/13 1507     Chief Complaint  Patient presents with  . Headache  . Weakness     (Consider location/radiation/quality/duration/timing/severity/associated sxs/prior Treatment) Patient is a 63 y.o. female presenting with headaches and weakness. The history is provided by the patient.  Headache Pain location:  Occipital Quality:  Sharp Radiates to:  R neck and L neck Severity currently:  10/10 Severity at highest:  10/10 Onset quality:  Sudden Duration:  4 hours Timing:  Constant Progression:  Unchanged Chronicity:  New Similar to prior headaches: no   Context: activity and bright light   Relieved by:  Nothing Worsened by:  Nothing tried Ineffective treatments:  None tried Associated symptoms: neck pain and photophobia   Associated symptoms: no congestion, no dizziness, no fever, no myalgias, no nausea, no visual change and no vomiting   Risk factors: no anger and no family hx of SAH   Weakness Associated symptoms include headaches, neck pain and weakness (RLE). Pertinent negatives include no arthralgias, chest pain, chills, congestion, fever, myalgias, nausea, visual change or vomiting.    Patient is a 63 y.o. female who presents with sudden onset headache, RLE weakness.  This started about 4 hours ago.  Headache started while she was sitting at her desk at home.  No recent head movement or neck strain.  Pain to base of neck and radiates up to the back of her head.  Denies fever, some chills at home.  Hx of C spine surgery for slipped disk in the past.  No noted upper extremity weakness.  Difficulty with ambulation, went to [facility] urgent care sent here for stroke eval.  Past Medical History  Diagnosis Date  . Hyperlipidemia   . Chronic kidney disease   . Migraine headache   . GERD (gastroesophageal reflux disease)   . Occipital neuralgia     Left   Past Surgical  History  Procedure Laterality Date  . Abdominal hysterectomy    . Knee surgery    . Carpal tunnel release    . Elbow surgery    . Lung surgery    . Bladder repair    . Cervical spine surgery     Family History  Problem Relation Age of Onset  . Cancer Mother 44    breast  . Alzheimer's disease Mother   . Cancer Brother   . Hyperlipidemia Brother    History  Substance Use Topics  . Smoking status: Former Smoker -- 0.30 packs/day    Types: Cigarettes    Quit date: 10/31/2012  . Smokeless tobacco: Never Used  . Alcohol Use: No   OB History   Grav Para Term Preterm Abortions TAB SAB Ect Mult Living                 Review of Systems  Constitutional: Negative for fever and chills.  HENT: Negative for congestion and rhinorrhea.   Eyes: Positive for photophobia. Negative for redness and visual disturbance.  Respiratory: Negative for shortness of breath and wheezing.   Cardiovascular: Negative for chest pain and palpitations.  Gastrointestinal: Negative for nausea and vomiting.  Genitourinary: Negative for dysuria and urgency.  Musculoskeletal: Positive for neck pain. Negative for arthralgias and myalgias.  Skin: Negative for pallor and wound.  Neurological: Positive for weakness (RLE) and headaches. Negative for dizziness.      Allergies  Shellfish allergy  Home Medications  Prior to Admission medications   Medication Sig Start Date End Date Taking? Authorizing Provider  clidinium-chlordiazePOXIDE (LIBRAX) 2.5-5 MG per capsule Take 1 capsule by mouth daily.    Yes Historical Provider, MD  doxepin (SINEQUAN) 100 MG capsule Take 100 mg by mouth at bedtime.   Yes Historical Provider, MD  esomeprazole (NEXIUM) 40 MG capsule Take 40 mg by mouth daily before breakfast.   Yes Historical Provider, MD  estradiol (VIVELLE-DOT) 0.025 MG/24HR Place 1 patch onto the skin every other day.    Yes Historical Provider, MD  fluticasone (FLONASE) 50 MCG/ACT nasal spray Place 2 sprays  into both nostrils daily as needed for allergies or rhinitis.   Yes Historical Provider, MD  furosemide (LASIX) 20 MG tablet Take 20 mg by mouth daily.    Yes Historical Provider, MD  gabapentin (NEURONTIN) 300 MG capsule Take 300 mg by mouth at bedtime.   Yes Historical Provider, MD  lidocaine (LIDODERM) 5 % Place 1 patch onto the skin daily. Remove & Discard patch within 12 hours or as directed by MD   Yes Historical Provider, MD  ramipril (ALTACE) 5 MG tablet Take 5 mg by mouth daily.   Yes Historical Provider, MD  topiramate (TOPAMAX) 100 MG tablet Take 1 tablet (100 mg total) by mouth at bedtime. 04/12/13  Yes Kathrynn Ducking, MD   BP 128/77  Pulse 101  Temp(Src) 98.2 F (36.8 C) (Oral)  Resp 16  Ht 5' 4.17" (1.63 m)  Wt 163 lb 9.3 oz (74.2 kg)  BMI 27.93 kg/m2  SpO2 100% Physical Exam  Constitutional: She is oriented to person, place, and time. She appears well-developed and well-nourished. No distress.  HENT:  Head: Normocephalic and atraumatic.  Eyes: EOM are normal. Pupils are equal, round, and reactive to light.  Neck: Normal range of motion. Neck supple.  Cardiovascular: Normal rate and regular rhythm.  Exam reveals no gallop and no friction rub.   No murmur heard. Pulmonary/Chest: Effort normal. She has no wheezes. She has no rales.  Abdominal: Soft. She exhibits no distension. There is no tenderness.  Musculoskeletal: She exhibits no edema and no tenderness.  Neurological: She is alert and oriented to person, place, and time. A sensory deficit (subjective R sided facial numbness) is present. Gait abnormal. GCS eye subscore is 4. GCS verbal subscore is 5. GCS motor subscore is 6. She displays no Babinski's sign on the right side. She displays no Babinski's sign on the left side.  Reflex Scores:      Tricep reflexes are 2+ on the right side and 2+ on the left side.      Bicep reflexes are 2+ on the right side and 2+ on the left side.      Brachioradialis reflexes are 2+ on  the right side and 2+ on the left side.      Patellar reflexes are 2+ on the right side and 2+ on the left side.      Achilles reflexes are 2+ on the right side and 2+ on the left side. Patient 4/5 muscle strength RLE, 5/5 LLE.  Shuffling gait, leans to the right.   Skin: Skin is warm and dry. She is not diaphoretic.  Psychiatric: She has a normal mood and affect. Her behavior is normal.    ED Course  Procedures (including critical care time) Labs Review Labs Reviewed  COMPREHENSIVE METABOLIC PANEL - Abnormal; Notable for the following:    Creatinine, Ser 1.85 (*)    Total Bilirubin  0.2 (*)    GFR calc non Af Amer 28 (*)    GFR calc Af Amer 33 (*)    All other components within normal limits  URINE RAPID DRUG SCREEN (HOSP PERFORMED) - Abnormal; Notable for the following:    Benzodiazepines POSITIVE (*)    All other components within normal limits  URINALYSIS, ROUTINE W REFLEX MICROSCOPIC - Abnormal; Notable for the following:    Nitrite POSITIVE (*)    Leukocytes, UA MODERATE (*)    All other components within normal limits  URINE MICROSCOPIC-ADD ON - Abnormal; Notable for the following:    Bacteria, UA MANY (*)    All other components within normal limits  I-STAT CHEM 8, ED - Abnormal; Notable for the following:    Creatinine, Ser 2.00 (*)    Glucose, Bld 101 (*)    All other components within normal limits  ETHANOL  PROTIME-INR  APTT  CBC  DIFFERENTIAL  I-STAT TROPOININ, ED  I-STAT TROPOININ, ED  CBG MONITORING, ED  I-STAT TROPOININ, ED    Imaging Review Ct Angio Head W/cm &/or Wo Cm  10/10/2013   CLINICAL DATA:  63 year old female sudden onset headache and right side weakness. Initial encounter. Code stroke.  EXAM: CT HEAD WITHOUT CONTRAST  CT ANGIOGRAPHY HEAD AND NECK  TECHNIQUE: Multi detector noncontrast CT of the head was performed.  Next, Multidetector CT imaging of the head and neck was performed using the standard protocol during bolus administration of  intravenous contrast. Multiplanar CT image reconstructions and MIPs were obtained to evaluate the vascular anatomy. Carotid stenosis measurements (when applicable) are obtained utilizing NASCET criteria, using the distal internal carotid diameter as the denominator.  CONTRAST:  25mL OMNIPAQUE IOHEXOL 350 MG/ML SOLN  COMPARISON:  None.  FINDINGS: CT HEAD FINDINGS  Calvarium intact. Visualized paranasal sinuses and mastoids are clear. Visualized scalp soft tissues are within normal limits. Cerebral volume is within normal limits for age. No midline shift, ventriculomegaly, mass effect, evidence of mass lesion, intracranial hemorrhage or evidence of cortically based acute infarction. Gray-white matter differentiation is within normal limits throughout the brain.  CTA HEAD FINDINGS  No abnormal enhancement of brain parenchyma identified.  VASCULAR FINDINGS:  Major intracranial venous structures appear patent.  Negative intracranial vertebral arteries, the left is mildly dominant. Normal PICA origins. Normal vertebrobasilar junction. Mildly tortuous but otherwise negative basilar artery. Normal SCA origins (the right is incidentally duplicated). PCA origins are within normal limits. Bilateral PCA branches are within normal limits. Posterior communicating arteries are diminutive or absent.  Mild bilateral ICA siphon calcified plaque. No associated ICA siphon stenosis. Ophthalmic artery origins are within normal limits. Patent carotid termini. MCA and ACA origins are within normal limits.  Diminutive anterior communicating artery. Bilateral ACA branches are within normal limits. Right MCA branches are within normal limits.  Left MCA origin and M1 segments are within normal limits. Patent left MCA bifurcation. No left MCA branch occlusion or stenosis identified.  Review of the MIP images confirms the above findings.  CTA NECK FINDINGS  Negative lung apices. No superior mediastinal lymphadenopathy. Negative thyroid, larynx,  pharynx, parapharyngeal spaces, retropharyngeal space, sublingual space, submandibular glands, and parotid glands. Paranasal sinuses are clear. Postoperative changes to the globes, otherwise negative orbits soft tissues. No acute osseous abnormality identified. Sequelae of cervical spine ACDF. Sequelae of left side lower cervical spine laminectomy.  No cervical lymphadenopathy.  VASCULAR FINDINGS:  Mildly bovine arch configuration.  Minimal aortic arch plaque.  Soft and calcified plaque of the  brachiocephalic artery without stenosis.  Right CCA origin is patent without stenosis. Negative right CCA proximal to the bifurcation. At the carotid bifurcation there is mostly calcified plaque. Proximal right ICA less than 50 % stenosis with respect to the distal vessel occurs. Mid and distal cervical right ICA are normal.  No hemodynamically significant proximal right subclavian artery stenosis suspected despite plaque. Normal right vertebral artery origin. Negative cervical right vertebral artery.  Normal left CCA origin. Medial soft plaque in the left CCA without hemodynamically significant stenosis. At the left carotid bifurcation there is mostly calcified plaque. No left ICA origin or bulb stenosis. Normal cervical left ICA otherwise.  Mostly calcified plaque in the proximal left subclavian artery, without hemodynamically significant stenosis. Moderate to severe left vertebral artery origin stenosis (coronal series 503, image 104) appears most likely related soft plaque. The left vertebral artery is patent, with a poststenotic dilatation. The vertebral arteries are fairly codominant in the neck. The cervical left vertebral artery otherwise is normal.  Review of the MIP images confirms the above findings.  IMPRESSION: 1. Carotid atherosclerosis, but no hemodynamically significant carotid stenosis. Negative right vertebral artery. Moderate to severe atherosclerotic stenosis at the left vertebral artery origin, with  otherwise negative left vertebral artery. 2. No intracranial artery stenosis or major circle of Willis branch occlusion. 3.  Normal CT appearance of the brain. Study discussed by telephone with Dr. Alexis Goodell on 10/10/2013 at 16:29 hours.   Electronically Signed   By: Lars Pinks M.D.   On: 10/10/2013 16:30   Ct Head Wo Contrast  10/10/2013   CLINICAL DATA:  63 year old female sudden onset headache and right side weakness. Initial encounter. Code stroke.  EXAM: CT HEAD WITHOUT CONTRAST  CT ANGIOGRAPHY HEAD AND NECK  TECHNIQUE: Multi detector noncontrast CT of the head was performed.  Next, Multidetector CT imaging of the head and neck was performed using the standard protocol during bolus administration of intravenous contrast. Multiplanar CT image reconstructions and MIPs were obtained to evaluate the vascular anatomy. Carotid stenosis measurements (when applicable) are obtained utilizing NASCET criteria, using the distal internal carotid diameter as the denominator.  CONTRAST:  69mL OMNIPAQUE IOHEXOL 350 MG/ML SOLN  COMPARISON:  None.  FINDINGS: CT HEAD FINDINGS  Calvarium intact. Visualized paranasal sinuses and mastoids are clear. Visualized scalp soft tissues are within normal limits. Cerebral volume is within normal limits for age. No midline shift, ventriculomegaly, mass effect, evidence of mass lesion, intracranial hemorrhage or evidence of cortically based acute infarction. Gray-white matter differentiation is within normal limits throughout the brain.  CTA HEAD FINDINGS  No abnormal enhancement of brain parenchyma identified.  VASCULAR FINDINGS:  Major intracranial venous structures appear patent.  Negative intracranial vertebral arteries, the left is mildly dominant. Normal PICA origins. Normal vertebrobasilar junction. Mildly tortuous but otherwise negative basilar artery. Normal SCA origins (the right is incidentally duplicated). PCA origins are within normal limits. Bilateral PCA branches are within  normal limits. Posterior communicating arteries are diminutive or absent.  Mild bilateral ICA siphon calcified plaque. No associated ICA siphon stenosis. Ophthalmic artery origins are within normal limits. Patent carotid termini. MCA and ACA origins are within normal limits.  Diminutive anterior communicating artery. Bilateral ACA branches are within normal limits. Right MCA branches are within normal limits.  Left MCA origin and M1 segments are within normal limits. Patent left MCA bifurcation. No left MCA branch occlusion or stenosis identified.  Review of the MIP images confirms the above findings.  CTA NECK FINDINGS  Negative lung apices. No superior mediastinal lymphadenopathy. Negative thyroid, larynx, pharynx, parapharyngeal spaces, retropharyngeal space, sublingual space, submandibular glands, and parotid glands. Paranasal sinuses are clear. Postoperative changes to the globes, otherwise negative orbits soft tissues. No acute osseous abnormality identified. Sequelae of cervical spine ACDF. Sequelae of left side lower cervical spine laminectomy.  No cervical lymphadenopathy.  VASCULAR FINDINGS:  Mildly bovine arch configuration.  Minimal aortic arch plaque.  Soft and calcified plaque of the brachiocephalic artery without stenosis.  Right CCA origin is patent without stenosis. Negative right CCA proximal to the bifurcation. At the carotid bifurcation there is mostly calcified plaque. Proximal right ICA less than 50 % stenosis with respect to the distal vessel occurs. Mid and distal cervical right ICA are normal.  No hemodynamically significant proximal right subclavian artery stenosis suspected despite plaque. Normal right vertebral artery origin. Negative cervical right vertebral artery.  Normal left CCA origin. Medial soft plaque in the left CCA without hemodynamically significant stenosis. At the left carotid bifurcation there is mostly calcified plaque. No left ICA origin or bulb stenosis. Normal cervical  left ICA otherwise.  Mostly calcified plaque in the proximal left subclavian artery, without hemodynamically significant stenosis. Moderate to severe left vertebral artery origin stenosis (coronal series 503, image 104) appears most likely related soft plaque. The left vertebral artery is patent, with a poststenotic dilatation. The vertebral arteries are fairly codominant in the neck. The cervical left vertebral artery otherwise is normal.  Review of the MIP images confirms the above findings.  IMPRESSION: 1. Carotid atherosclerosis, but no hemodynamically significant carotid stenosis. Negative right vertebral artery. Moderate to severe atherosclerotic stenosis at the left vertebral artery origin, with otherwise negative left vertebral artery. 2. No intracranial artery stenosis or major circle of Willis branch occlusion. 3.  Normal CT appearance of the brain. Study discussed by telephone with Dr. Alexis Goodell on 10/10/2013 at 16:29 hours.   Electronically Signed   By: Lars Pinks M.D.   On: 10/10/2013 16:30   Ct Angio Neck W/cm &/or Wo/cm  10/10/2013   CLINICAL DATA:  63 year old female sudden onset headache and right side weakness. Initial encounter. Code stroke.  EXAM: CT HEAD WITHOUT CONTRAST  CT ANGIOGRAPHY HEAD AND NECK  TECHNIQUE: Multi detector noncontrast CT of the head was performed.  Next, Multidetector CT imaging of the head and neck was performed using the standard protocol during bolus administration of intravenous contrast. Multiplanar CT image reconstructions and MIPs were obtained to evaluate the vascular anatomy. Carotid stenosis measurements (when applicable) are obtained utilizing NASCET criteria, using the distal internal carotid diameter as the denominator.  CONTRAST:  20mL OMNIPAQUE IOHEXOL 350 MG/ML SOLN  COMPARISON:  None.  FINDINGS: CT HEAD FINDINGS  Calvarium intact. Visualized paranasal sinuses and mastoids are clear. Visualized scalp soft tissues are within normal limits. Cerebral volume  is within normal limits for age. No midline shift, ventriculomegaly, mass effect, evidence of mass lesion, intracranial hemorrhage or evidence of cortically based acute infarction. Gray-white matter differentiation is within normal limits throughout the brain.  CTA HEAD FINDINGS  No abnormal enhancement of brain parenchyma identified.  VASCULAR FINDINGS:  Major intracranial venous structures appear patent.  Negative intracranial vertebral arteries, the left is mildly dominant. Normal PICA origins. Normal vertebrobasilar junction. Mildly tortuous but otherwise negative basilar artery. Normal SCA origins (the right is incidentally duplicated). PCA origins are within normal limits. Bilateral PCA branches are within normal limits. Posterior communicating arteries are diminutive or absent.  Mild bilateral ICA siphon calcified plaque.  No associated ICA siphon stenosis. Ophthalmic artery origins are within normal limits. Patent carotid termini. MCA and ACA origins are within normal limits.  Diminutive anterior communicating artery. Bilateral ACA branches are within normal limits. Right MCA branches are within normal limits.  Left MCA origin and M1 segments are within normal limits. Patent left MCA bifurcation. No left MCA branch occlusion or stenosis identified.  Review of the MIP images confirms the above findings.  CTA NECK FINDINGS  Negative lung apices. No superior mediastinal lymphadenopathy. Negative thyroid, larynx, pharynx, parapharyngeal spaces, retropharyngeal space, sublingual space, submandibular glands, and parotid glands. Paranasal sinuses are clear. Postoperative changes to the globes, otherwise negative orbits soft tissues. No acute osseous abnormality identified. Sequelae of cervical spine ACDF. Sequelae of left side lower cervical spine laminectomy.  No cervical lymphadenopathy.  VASCULAR FINDINGS:  Mildly bovine arch configuration.  Minimal aortic arch plaque.  Soft and calcified plaque of the  brachiocephalic artery without stenosis.  Right CCA origin is patent without stenosis. Negative right CCA proximal to the bifurcation. At the carotid bifurcation there is mostly calcified plaque. Proximal right ICA less than 50 % stenosis with respect to the distal vessel occurs. Mid and distal cervical right ICA are normal.  No hemodynamically significant proximal right subclavian artery stenosis suspected despite plaque. Normal right vertebral artery origin. Negative cervical right vertebral artery.  Normal left CCA origin. Medial soft plaque in the left CCA without hemodynamically significant stenosis. At the left carotid bifurcation there is mostly calcified plaque. No left ICA origin or bulb stenosis. Normal cervical left ICA otherwise.  Mostly calcified plaque in the proximal left subclavian artery, without hemodynamically significant stenosis. Moderate to severe left vertebral artery origin stenosis (coronal series 503, image 104) appears most likely related soft plaque. The left vertebral artery is patent, with a poststenotic dilatation. The vertebral arteries are fairly codominant in the neck. The cervical left vertebral artery otherwise is normal.  Review of the MIP images confirms the above findings.  IMPRESSION: 1. Carotid atherosclerosis, but no hemodynamically significant carotid stenosis. Negative right vertebral artery. Moderate to severe atherosclerotic stenosis at the left vertebral artery origin, with otherwise negative left vertebral artery. 2. No intracranial artery stenosis or major circle of Willis branch occlusion. 3.  Normal CT appearance of the brain. Study discussed by telephone with Dr. Alexis Goodell on 10/10/2013 at 16:29 hours.   Electronically Signed   By: Lars Pinks M.D.   On: 10/10/2013 16:30   Dg Chest Portable 1 View  10/10/2013   CLINICAL DATA:  Chest pain and difficulty breathing  EXAM: PORTABLE CHEST - 1 VIEW  COMPARISON:  December 27, 2012  FINDINGS: There is underlying  emphysematous change. There is mild scarring in the left base. There is no edema or consolidation. The heart size and pulmonary vascularity are within normal limits. No adenopathy. There is postoperative change in the lower cervical spine region.  IMPRESSION: Underlying emphysema. Mild scarring left base. No edema or consolidation.   Electronically Signed   By: Lowella Grip M.D.   On: 10/10/2013 17:41     EKG Interpretation   Date/Time:  Monday October 10 2013 15:27:35 EDT Ventricular Rate:  107 PR Interval:  135 QRS Duration: 81 QT Interval:  323 QTC Calculation: 431 R Axis:   70 Text Interpretation:  Age not entered, assumed to be  63 years old for  purpose of ECG interpretation Sinus tachycardia Baseline wander in lead(s)  V6 besides tachycardia, no significant change since 2008 Confirmed by  [place]  MD, Nicki Reaper [phone]) on 10/10/2013 4:00:05 PM      MDM   Final diagnoses:  Migraine variant  Disturbance of skin sensation    Patient is a 63 y.o. female who presents with sudden onset headache, RLE weakness.  This started about 4 hours ago.  Headache started while she was sitting at her desk at home.  No recent head movement or neck strain.  Pain to base of neck and radiates up to the back of her head.  Denies fever, some chills at home.  Hx of C spine surgery for slipped disk in the past.  No noted upper extremity weakness.  Difficulty with ambulation, went to [place] urgent care sent here for stroke eval.     Patient with focal regular RLE weakness on neuro exam. Patient also somewhat leaning to the right during ambulation. Last seen  normal about 4 hours ago code stroke initiated.  CT head, CTA head and neck unremarkable.  Neuro saw patient feel that MRI would be warranted if no improvement.   Patient reassessed no improvement with compazine, benadryl.  Given depakote.  Also now complaining of substernal chest pain, radiation to the neck.  ECG unrmearkable, cxr and initial trop  negative.   Fam Med consulted, will admit to obs.     Deno Etienne, MD 10/10/13 [phone]

## 2013-10-10 NOTE — ED Notes (Signed)
Attempted report x1. 

## 2013-10-11 ENCOUNTER — Other Ambulatory Visit: Payer: Self-pay

## 2013-10-11 ENCOUNTER — Inpatient Hospital Stay (HOSPITAL_COMMUNITY): Payer: PRIVATE HEALTH INSURANCE

## 2013-10-11 DIAGNOSIS — G548 Other nerve root and plexus disorders: Secondary | ICD-10-CM

## 2013-10-11 DIAGNOSIS — G459 Transient cerebral ischemic attack, unspecified: Secondary | ICD-10-CM

## 2013-10-11 LAB — BASIC METABOLIC PANEL
Anion gap: 16 — ABNORMAL HIGH (ref 5–15)
BUN: 15 mg/dL (ref 6–23)
CO2: 19 mEq/L (ref 19–32)
Calcium: 8.5 mg/dL (ref 8.4–10.5)
Chloride: 107 mEq/L (ref 96–112)
Creatinine, Ser: 1.29 mg/dL — ABNORMAL HIGH (ref 0.50–1.10)
GFR calc Af Amer: 51 mL/min — ABNORMAL LOW (ref >90)
GFR calc non Af Amer: 44 mL/min — ABNORMAL LOW (ref >90)
Glucose, Bld: 70 mg/dL (ref 70–99)
Potassium: 4 mEq/L (ref 3.7–5.3)
Sodium: 142 mEq/L (ref 137–147)

## 2013-10-11 LAB — LIPID PANEL
Cholesterol: 204 mg/dL — ABNORMAL HIGH (ref 0–200)
HDL: 55 mg/dL (ref >39)
LDL Cholesterol: 122 mg/dL — ABNORMAL HIGH (ref 0–99)
Total CHOL/HDL Ratio: 3.7 RATIO
Triglycerides: 133 mg/dL (ref <150)
VLDL: 27 mg/dL (ref 0–40)

## 2013-10-11 LAB — HEMOGLOBIN A1C
Hgb A1c MFr Bld: 5.9 % — ABNORMAL HIGH (ref <5.7)
Mean Plasma Glucose: 123 mg/dL — ABNORMAL HIGH (ref <117)

## 2013-10-11 LAB — TROPONIN I: Troponin I: <0.3 ng/mL (ref <0.30)

## 2013-10-11 MED ORDER — ONDANSETRON HCL 4 MG/2ML IJ SOLN: 4.0000 mg | Freq: Three times a day (TID) | INTRAMUSCULAR | Status: AC | PRN

## 2013-10-11 MED ORDER — SUMATRIPTAN SUCCINATE 25 MG PO TABS
25.0000 mg | ORAL_TABLET | Freq: Once | ORAL | Status: AC
  Administered 2013-10-11: 25 mg via ORAL
  Filled 2013-10-11: qty 1

## 2013-10-11 MED ORDER — PROPRANOLOL HCL 10 MG PO TABS
10.0000 mg | ORAL_TABLET | Freq: Two times a day (BID) | ORAL | Status: DC
  Administered 2013-10-11 – 2013-10-12 (×2): 10 mg via ORAL
  Filled 2013-10-11 (×4): qty 1

## 2013-10-11 MED ORDER — CEFTRIAXONE SODIUM 1 G IJ SOLR
1.0000 g | Freq: Once | INTRAMUSCULAR | Status: AC
  Administered 2013-10-11: 1 g via INTRAVENOUS
  Filled 2013-10-11: qty 10

## 2013-10-11 NOTE — Progress Notes (Signed)
Pt bed alarm goes off at 0325 in morning for pt needing to use bathroom. Pt states she is having chest pain, described as pressure on her chest, and that it is hard for her to breathe. Paged family medicine on call and rapid response. Placed pt on 2L 02, EKG obtained, VVS. Pt described the initial pain a 10 out of 10 and 15 min later with no intervention except 02 pain was rated a 3. No new orders obtained. Will continue to monitor. [place], Velora Horstman N, RN

## 2013-10-11 NOTE — H&P (Signed)
FMTS Attending Note  I personally saw and evaluated the patient. The plan of care was discussed with the resident team. I agree with the assessment and plan as documented by the resident.   63 y/o female with PMH Migraines without aura, HTN, occipital neuralgia, GERD, and CKD presents with headache and right sided weakness. Please refer to resident note for additional HPI.   Vitals: reviewed Gen: pleasant Caucasian female, NAD HEENT: normocephalic, PERRL, EOMI, no scleral icterus, nasal septum midline, MMM, uvula midline, neck supple, no cervical lymphadenopathy Cardiac: RRR, S1 and S2 present, no murmurs, no heaves/thrills Resp: CTAB, normal effort Abd: soft, no tenderness, normal bowel sounds Ext: no edema Neuro: CN2-12 intact, subjective decreased sensation to light touch on right face/arm/leg, strength 4+/5 in RUE and RLE, strength 5/5 in LUE/LLE, 2+ reflexes on right side (patellar, biceps)  Reviewed lab work and imaging.   Assessment and Plan: 63 y/o female presents with headache and right sides weakness/numbness. 1. 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 HTN - stable, continue Altace and Lasix 3. UTI - await urine culture, empirically covered with Rocephin 4. Chest pain - cardiac workup negative, suspect panic attack 5. Acute on Chronic renal failure - Cr up to 2.0, agree with hydration and recheck BMP  Dossie Arbour MD

## 2013-10-11 NOTE — Progress Notes (Signed)
Called by Merleen Nicely, RN with concerns over patient Chest pain, onset at 0325 while going to the bathroom.  Rated pain 10/10, substernal with no radiation but did have increased SOB. On arrival to floor Dr Lorenso Courier had evaluated pt, a 12 lead EKG was obtained with no ischemic changes, Pt's CP decreased to 3/10 with no intervention.  Troponin x two negative.  Dr Lorenso Courier advised no further intervention at this time.  Will await third troponin at 0800.   VS:  137/91  90 NSR  RR20 with 99% on 2l [place].  Hand off report to [place].  Will follow as needed

## 2013-10-11 NOTE — Progress Notes (Signed)
*  PRELIMINARY RESULTS* Vascular Ultrasound Carotid Doppler has been completed.  Preliminary findings: Right = 40-59% ICA stenosis, low end of scale. Left = 1-39% ICA stenosis.  Landry Mellow, RDMS, RVT  10/11/2013, 3:47 PM

## 2013-10-11 NOTE — Progress Notes (Signed)
Subjective: Patient reports that her headache continues at a 4/10.  Continues to have left sided weakness and numbness.  Chest pain has resolved.    Objective: Current vital signs: BP 111/70  Pulse 83  Temp(Src) 97.6 F (36.4 C) (Oral)  Resp 18  Ht 5\' 4"  (1.626 m)  Wt 73.8 kg (162 lb 11.2 oz)  BMI 27.91 kg/m2  SpO2 100% Vital signs in last 24 hours: Temp:  [97.6 F (36.4 C)-99.1 F (37.3 C)] 97.6 F (36.4 C) (08/04 0936) Pulse Rate:  [83-120] 83 (08/04 0936) Resp:  [15-23] 18 (08/04 0936) BP: (111-160)/(59-91) 111/70 mmHg (08/04 0936) SpO2:  [97 %-100 %] 100 % (08/04 0936) Weight:  [73.8 kg (162 lb 11.2 oz)-74.2 kg (163 lb 9.3 oz)] 73.8 kg (162 lb 11.2 oz) (08/03 1950)  Intake/Output from previous day:   Intake/Output this shift:   Nutritional status: General  Neurologic Exam: Mental Status:  Alert, oriented, thought content appropriate. Speech fluent without evidence of aphasia. Able to follow 3 step commands without difficulty.  Cranial Nerves:  II: Discs flat bilaterally; Visual fields grossly normal, pupils equal, round, reactive to light and accommodation  III,IV, VI: ptosis not present, extra-ocular motions intact bilaterally  V,VII: smile symmetric, facial light touch sensation decreased on the right aspect of her face, splitting midline to both light touch and vibration.  VIII: hearing normal bilaterally  IX,X: gag reflex present  XI: bilateral shoulder shrug  XII: midline tongue extension without atrophy or fasciculations  Motor:  5-/5 right hand grip but otherwise 5/5 in the BUE's.  No drift noted.  5/5 in the LLE.  On attempts at lifting the right the patient is not able to lift as strongly on the left but gives no downward movement of the LLE.  2 Sensory: Pinprick and light touch Decreased on the right side--splitting midline from sternum to abdomen.  Deep Tendon Reflexes:  2+ throughout  Plantars:  Right: downgoing   Left: downgoing  Cerebellar:  normal  finger-to-nose, normal heel-to-shin test    Lab Results: Basic Metabolic Panel:  Recent Labs Lab 10/10/13 1521 10/10/13 1547 10/11/13 0845  NA 142 139 142  K 4.0 4.0 4.0  CL 102 104 107  CO2 25  --  19  GLUCOSE 98 101* 70  BUN 16 16 15   CREATININE 1.85* 2.00* 1.29*  CALCIUM 8.9  --  8.5    Liver Function Tests:  Recent Labs Lab 10/10/13 1521  AST 17  ALT 20  ALKPHOS 108  BILITOT 0.2*  PROT 7.1  ALBUMIN 4.0   No results found for this basename: LIPASE, AMYLASE,  in the last 168 hours No results found for this basename: AMMONIA,  in the last 168 hours  CBC:  Recent Labs Lab 10/10/13 1521 10/10/13 1547  WBC 7.4  --   NEUTROABS 5.3  --   HGB 13.3 14.3  HCT 40.6 42.0  MCV 87.5  --   PLT 232  --     Cardiac Enzymes:  Recent Labs Lab 10/10/13 2042 10/11/13 0157  TROPONINI <0.30 <0.30    Lipid Panel:  Recent Labs Lab 10/11/13 0157  CHOL 204*  TRIG 133  HDL 55  CHOLHDL 3.7  VLDL 27  LDLCALC 122*    CBG:  Recent Labs Lab 10/10/13 1534  [place]    Microbiology: Results for orders placed in visit on 07/18/13  URINE CULTURE     Status: None   Collection Time    07/18/13  3:03 PM  Result Value Ref Range Status   Culture ESCHERICHIA COLI   Final   Colony Count >=100,000 COLONIES/ML   Final   Organism ID, Bacteria ESCHERICHIA COLI   Final    Coagulation Studies:  Recent Labs  10/10/13 1521  LABPROT 13.5  INR 1.03    Imaging: Ct Angio Head W/cm &/or Wo Cm  10/10/2013   CLINICAL DATA:  63 year old female sudden onset headache and right side weakness. Initial encounter. Code stroke.  EXAM: CT HEAD WITHOUT CONTRAST  CT ANGIOGRAPHY HEAD AND NECK  TECHNIQUE: Multi detector noncontrast CT of the head was performed.  Next, Multidetector CT imaging of the head and neck was performed using the standard protocol during bolus administration of intravenous contrast. Multiplanar CT image reconstructions and MIPs were obtained to evaluate  the vascular anatomy. Carotid stenosis measurements (when applicable) are obtained utilizing NASCET criteria, using the distal internal carotid diameter as the denominator.  CONTRAST:  31mL OMNIPAQUE IOHEXOL 350 MG/ML SOLN  COMPARISON:  None.  FINDINGS: CT HEAD FINDINGS  Calvarium intact. Visualized paranasal sinuses and mastoids are clear. Visualized scalp soft tissues are within normal limits. Cerebral volume is within normal limits for age. No midline shift, ventriculomegaly, mass effect, evidence of mass lesion, intracranial hemorrhage or evidence of cortically based acute infarction. Gray-white matter differentiation is within normal limits throughout the brain.  CTA HEAD FINDINGS  No abnormal enhancement of brain parenchyma identified.  VASCULAR FINDINGS:  Major intracranial venous structures appear patent.  Negative intracranial vertebral arteries, the left is mildly dominant. Normal PICA origins. Normal vertebrobasilar junction. Mildly tortuous but otherwise negative basilar artery. Normal SCA origins (the right is incidentally duplicated). PCA origins are within normal limits. Bilateral PCA branches are within normal limits. Posterior communicating arteries are diminutive or absent.  Mild bilateral ICA siphon calcified plaque. No associated ICA siphon stenosis. Ophthalmic artery origins are within normal limits. Patent carotid termini. MCA and ACA origins are within normal limits.  Diminutive anterior communicating artery. Bilateral ACA branches are within normal limits. Right MCA branches are within normal limits.  Left MCA origin and M1 segments are within normal limits. Patent left MCA bifurcation. No left MCA branch occlusion or stenosis identified.  Review of the MIP images confirms the above findings.  CTA NECK FINDINGS  Negative lung apices. No superior mediastinal lymphadenopathy. Negative thyroid, larynx, pharynx, parapharyngeal spaces, retropharyngeal space, sublingual space, submandibular glands,  and parotid glands. Paranasal sinuses are clear. Postoperative changes to the globes, otherwise negative orbits soft tissues. No acute osseous abnormality identified. Sequelae of cervical spine ACDF. Sequelae of left side lower cervical spine laminectomy.  No cervical lymphadenopathy.  VASCULAR FINDINGS:  Mildly bovine arch configuration.  Minimal aortic arch plaque.  Soft and calcified plaque of the brachiocephalic artery without stenosis.  Right CCA origin is patent without stenosis. Negative right CCA proximal to the bifurcation. At the carotid bifurcation there is mostly calcified plaque. Proximal right ICA less than 50 % stenosis with respect to the distal vessel occurs. Mid and distal cervical right ICA are normal.  No hemodynamically significant proximal right subclavian artery stenosis suspected despite plaque. Normal right vertebral artery origin. Negative cervical right vertebral artery.  Normal left CCA origin. Medial soft plaque in the left CCA without hemodynamically significant stenosis. At the left carotid bifurcation there is mostly calcified plaque. No left ICA origin or bulb stenosis. Normal cervical left ICA otherwise.  Mostly calcified plaque in the proximal left subclavian artery, without hemodynamically significant stenosis. Moderate to severe left vertebral  artery origin stenosis (coronal series 503, image 104) appears most likely related soft plaque. The left vertebral artery is patent, with a poststenotic dilatation. The vertebral arteries are fairly codominant in the neck. The cervical left vertebral artery otherwise is normal.  Review of the MIP images confirms the above findings.  IMPRESSION: 1. Carotid atherosclerosis, but no hemodynamically significant carotid stenosis. Negative right vertebral artery. Moderate to severe atherosclerotic stenosis at the left vertebral artery origin, with otherwise negative left vertebral artery. 2. No intracranial artery stenosis or major circle of  Willis branch occlusion. 3.  Normal CT appearance of the brain. Study discussed by telephone with Dr. Alexis Goodell on 10/10/2013 at 16:29 hours.   Electronically Signed   By: Lars Pinks M.D.   On: 10/10/2013 16:30   Ct Head Wo Contrast  10/10/2013   CLINICAL DATA:  63 year old female sudden onset headache and right side weakness. Initial encounter. Code stroke.  EXAM: CT HEAD WITHOUT CONTRAST  CT ANGIOGRAPHY HEAD AND NECK  TECHNIQUE: Multi detector noncontrast CT of the head was performed.  Next, Multidetector CT imaging of the head and neck was performed using the standard protocol during bolus administration of intravenous contrast. Multiplanar CT image reconstructions and MIPs were obtained to evaluate the vascular anatomy. Carotid stenosis measurements (when applicable) are obtained utilizing NASCET criteria, using the distal internal carotid diameter as the denominator.  CONTRAST:  31mL OMNIPAQUE IOHEXOL 350 MG/ML SOLN  COMPARISON:  None.  FINDINGS: CT HEAD FINDINGS  Calvarium intact. Visualized paranasal sinuses and mastoids are clear. Visualized scalp soft tissues are within normal limits. Cerebral volume is within normal limits for age. No midline shift, ventriculomegaly, mass effect, evidence of mass lesion, intracranial hemorrhage or evidence of cortically based acute infarction. Gray-white matter differentiation is within normal limits throughout the brain.  CTA HEAD FINDINGS  No abnormal enhancement of brain parenchyma identified.  VASCULAR FINDINGS:  Major intracranial venous structures appear patent.  Negative intracranial vertebral arteries, the left is mildly dominant. Normal PICA origins. Normal vertebrobasilar junction. Mildly tortuous but otherwise negative basilar artery. Normal SCA origins (the right is incidentally duplicated). PCA origins are within normal limits. Bilateral PCA branches are within normal limits. Posterior communicating arteries are diminutive or absent.  Mild bilateral ICA  siphon calcified plaque. No associated ICA siphon stenosis. Ophthalmic artery origins are within normal limits. Patent carotid termini. MCA and ACA origins are within normal limits.  Diminutive anterior communicating artery. Bilateral ACA branches are within normal limits. Right MCA branches are within normal limits.  Left MCA origin and M1 segments are within normal limits. Patent left MCA bifurcation. No left MCA branch occlusion or stenosis identified.  Review of the MIP images confirms the above findings.  CTA NECK FINDINGS  Negative lung apices. No superior mediastinal lymphadenopathy. Negative thyroid, larynx, pharynx, parapharyngeal spaces, retropharyngeal space, sublingual space, submandibular glands, and parotid glands. Paranasal sinuses are clear. Postoperative changes to the globes, otherwise negative orbits soft tissues. No acute osseous abnormality identified. Sequelae of cervical spine ACDF. Sequelae of left side lower cervical spine laminectomy.  No cervical lymphadenopathy.  VASCULAR FINDINGS:  Mildly bovine arch configuration.  Minimal aortic arch plaque.  Soft and calcified plaque of the brachiocephalic artery without stenosis.  Right CCA origin is patent without stenosis. Negative right CCA proximal to the bifurcation. At the carotid bifurcation there is mostly calcified plaque. Proximal right ICA less than 50 % stenosis with respect to the distal vessel occurs. Mid and distal cervical right ICA are normal.  No hemodynamically significant proximal right subclavian artery stenosis suspected despite plaque. Normal right vertebral artery origin. Negative cervical right vertebral artery.  Normal left CCA origin. Medial soft plaque in the left CCA without hemodynamically significant stenosis. At the left carotid bifurcation there is mostly calcified plaque. No left ICA origin or bulb stenosis. Normal cervical left ICA otherwise.  Mostly calcified plaque in the proximal left subclavian artery, without  hemodynamically significant stenosis. Moderate to severe left vertebral artery origin stenosis (coronal series 503, image 104) appears most likely related soft plaque. The left vertebral artery is patent, with a poststenotic dilatation. The vertebral arteries are fairly codominant in the neck. The cervical left vertebral artery otherwise is normal.  Review of the MIP images confirms the above findings.  IMPRESSION: 1. Carotid atherosclerosis, but no hemodynamically significant carotid stenosis. Negative right vertebral artery. Moderate to severe atherosclerotic stenosis at the left vertebral artery origin, with otherwise negative left vertebral artery. 2. No intracranial artery stenosis or major circle of Willis branch occlusion. 3.  Normal CT appearance of the brain. Study discussed by telephone with Dr. Alexis Goodell on 10/10/2013 at 16:29 hours.   Electronically Signed   By: Lars Pinks M.D.   On: 10/10/2013 16:30   Ct Angio Neck W/cm &/or Wo/cm  10/10/2013   CLINICAL DATA:  63 year old female sudden onset headache and right side weakness. Initial encounter. Code stroke.  EXAM: CT HEAD WITHOUT CONTRAST  CT ANGIOGRAPHY HEAD AND NECK  TECHNIQUE: Multi detector noncontrast CT of the head was performed.  Next, Multidetector CT imaging of the head and neck was performed using the standard protocol during bolus administration of intravenous contrast. Multiplanar CT image reconstructions and MIPs were obtained to evaluate the vascular anatomy. Carotid stenosis measurements (when applicable) are obtained utilizing NASCET criteria, using the distal internal carotid diameter as the denominator.  CONTRAST:  106mL OMNIPAQUE IOHEXOL 350 MG/ML SOLN  COMPARISON:  None.  FINDINGS: CT HEAD FINDINGS  Calvarium intact. Visualized paranasal sinuses and mastoids are clear. Visualized scalp soft tissues are within normal limits. Cerebral volume is within normal limits for age. No midline shift, ventriculomegaly, mass effect, evidence  of mass lesion, intracranial hemorrhage or evidence of cortically based acute infarction. Gray-white matter differentiation is within normal limits throughout the brain.  CTA HEAD FINDINGS  No abnormal enhancement of brain parenchyma identified.  VASCULAR FINDINGS:  Major intracranial venous structures appear patent.  Negative intracranial vertebral arteries, the left is mildly dominant. Normal PICA origins. Normal vertebrobasilar junction. Mildly tortuous but otherwise negative basilar artery. Normal SCA origins (the right is incidentally duplicated). PCA origins are within normal limits. Bilateral PCA branches are within normal limits. Posterior communicating arteries are diminutive or absent.  Mild bilateral ICA siphon calcified plaque. No associated ICA siphon stenosis. Ophthalmic artery origins are within normal limits. Patent carotid termini. MCA and ACA origins are within normal limits.  Diminutive anterior communicating artery. Bilateral ACA branches are within normal limits. Right MCA branches are within normal limits.  Left MCA origin and M1 segments are within normal limits. Patent left MCA bifurcation. No left MCA branch occlusion or stenosis identified.  Review of the MIP images confirms the above findings.  CTA NECK FINDINGS  Negative lung apices. No superior mediastinal lymphadenopathy. Negative thyroid, larynx, pharynx, parapharyngeal spaces, retropharyngeal space, sublingual space, submandibular glands, and parotid glands. Paranasal sinuses are clear. Postoperative changes to the globes, otherwise negative orbits soft tissues. No acute osseous abnormality identified. Sequelae of cervical spine ACDF. Sequelae of left side  lower cervical spine laminectomy.  No cervical lymphadenopathy.  VASCULAR FINDINGS:  Mildly bovine arch configuration.  Minimal aortic arch plaque.  Soft and calcified plaque of the brachiocephalic artery without stenosis.  Right CCA origin is patent without stenosis. Negative right  CCA proximal to the bifurcation. At the carotid bifurcation there is mostly calcified plaque. Proximal right ICA less than 50 % stenosis with respect to the distal vessel occurs. Mid and distal cervical right ICA are normal.  No hemodynamically significant proximal right subclavian artery stenosis suspected despite plaque. Normal right vertebral artery origin. Negative cervical right vertebral artery.  Normal left CCA origin. Medial soft plaque in the left CCA without hemodynamically significant stenosis. At the left carotid bifurcation there is mostly calcified plaque. No left ICA origin or bulb stenosis. Normal cervical left ICA otherwise.  Mostly calcified plaque in the proximal left subclavian artery, without hemodynamically significant stenosis. Moderate to severe left vertebral artery origin stenosis (coronal series 503, image 104) appears most likely related soft plaque. The left vertebral artery is patent, with a poststenotic dilatation. The vertebral arteries are fairly codominant in the neck. The cervical left vertebral artery otherwise is normal.  Review of the MIP images confirms the above findings.  IMPRESSION: 1. Carotid atherosclerosis, but no hemodynamically significant carotid stenosis. Negative right vertebral artery. Moderate to severe atherosclerotic stenosis at the left vertebral artery origin, with otherwise negative left vertebral artery. 2. No intracranial artery stenosis or major circle of Willis branch occlusion. 3.  Normal CT appearance of the brain. Study discussed by telephone with Dr. Alexis Goodell on 10/10/2013 at 16:29 hours.   Electronically Signed   By: Lars Pinks M.D.   On: 10/10/2013 16:30   Dg Chest Portable 1 View  10/10/2013   CLINICAL DATA:  Chest pain and difficulty breathing  EXAM: PORTABLE CHEST - 1 VIEW  COMPARISON:  December 27, 2012  FINDINGS: There is underlying emphysematous change. There is mild scarring in the left base. There is no edema or consolidation. The heart  size and pulmonary vascularity are within normal limits. No adenopathy. There is postoperative change in the lower cervical spine region.  IMPRESSION: Underlying emphysema. Mild scarring left base. No edema or consolidation.   Electronically Signed   By: Lowella Grip M.D.   On: 10/10/2013 17:41    Medications:  I have reviewed the patient's current medications. Scheduled: . clidinium-chlordiazePOXIDE  1 capsule Oral QAC breakfast  . clopidogrel  75 mg Oral Daily  . doxepin  100 mg Oral QHS  . gabapentin  300 mg Oral QHS  . heparin  5,000 Units Subcutaneous 3 times per day  . pantoprazole  40 mg Oral Daily  . ramipril  5 mg Oral Daily  . sodium chloride  3 mL Intravenous Q12H  . topiramate  100 mg Oral QHS    Assessment/Plan: Patient with improved headache severity although improved.  Continues right sided complaints with multiple functional features.  MRI of the brain pending.  CT and CTA of the head show no acute changes.    Recommendations: 1.  Will follow up results of MRI.  If no evidence of acute infarct wold not proceed with further stroke work up. 2.  Would continue Plavix at 75mg  daily for medical management of her vertebral stenosis 3.  Patient OOB   LOS: 1 day   Alexis Goodell, MD [facility] [phone] 10/11/2013  10:20 AM

## 2013-10-11 NOTE — Progress Notes (Signed)
Interval Progress Note Family Medicine Teaching Service   MD paged concerning chest pressure.   Patient states that her chest pressure is medial/left sided and was a 10/10.  This pain came on while at rest and did not radiate. It was associated with SOB but not diaphoresis or N/V. Patient notes that she has a history of GERD and panic attacks but this is not like that. She tells me this chest pressure/dyspnea is similar to what shem came into the ED with. Per her history, she has intermittent chest pressure/dyspnea that lasts 30-45 minutes and then resolves spontaneously. After the EKG and placing her on [place], approximately 15 minutes after her chest pressure began, she tells me it's improving and is currently a 3/10.  137/9, pulse 90, O2 sat 99% on 2L  Patient in NAD lying in bed. Not diaphoretic. EKG being performed  RRR, no m/r/g noted Lungs CTAB.  EKG: Sinus rhythm. HR 89. PACs present. T wave inversions in aVR and V1 noted on previous EKGs.   Troponins have been neg x 2 since admission. 1 troponin pending at approximately 8am. If elevated may consider trending troponins again/consulting cardiology. As chest pressure is resolving spontaneously, will hold nitro SL, however it is on PRN Additionally, patient has a history of panic attacks, with no current PRNs ordered. May consider adding this in the AM per primary team.

## 2013-10-11 NOTE — Progress Notes (Signed)
Family Medicine Teaching Service Daily Progress Note Intern Pager: [phone]  Patient name: [name] Medical record number: [id number] Date of birth: Mar 22, 1950 Age: 63 y.o. Gender: female  Primary Care Provider: Vidal Schwalbe, MD Consultants: Neurology Code Status: DNR  Pt Overview and Major Events to Date:  08/04: Patient experienced chest pain/pressure at 3am.  Rapid response called.  CP resolved with no intervention.    Assessment and Plan:  [name] is a 63 y.o. female presenting with right sided weakness and headache . PMH is significant for migraines without aura, HTN, occipital neuralgia, GERD, CKD.   1. R sided weakness: (SAH vs CVA vs TIA vs Vertebral artery stenosis/dz vs migraine related).  -awaiting MRI w/out contrast  -Seen by Neurology, appreciate recs: suspect migraine related  -Neuro checks q2 hours  -Plavix 75mg  daily.  -awaiting 2D echo per TIA order set   2. Migraine Headache: (CVA vs TIA vs Atypical migraine). Received valproate 500mg  in ED, benadryl.  -Improved with Depacon.   -Tylenol PRN  -Continue home dose of topamax, doxepin, gabapentin  -zofran PRN for nausea/vomiting   3. HTN, stable. BP 123/63  -Continue home altace, lasix  -Continue to monitor vitals per floor protocol   4. UTI: UA with positive nitrites and moderate leukocytes, many bacteria on microscopy  -awaiting urine culture results -1g Rocephin IV given today -Abx pending culture   5. Chest pain w/ SOB ?panic attack/anxiety. EKG with sinus tachycardia but otherwise appears similar compared to 2014. CXR with underlying emphysema but no infiltrate.  -troponins negative x3  -labs per above  -2d echo per above  -nitro SL as needed  -EKG this morning sinus   6. GERD  -Continue Nexium   7. CKD. Cr 2.00, most recent in chart was 1.04 in 2008  - BMET ordered awaiting results. - gentle IV hydration    FEN/GI: Passed swallow eval. Regular diet. NS @ 50cc/hr   Prophylaxis: Sub-q heparin   Disposition: Awaiting MRI result, d/c home with outpatient neuro f/u pending imaging and cultures.  Subjective:  Patient's headache has improved greatly since yesterday after administration of Depacon.  Now a 4/10 from a 10/10.  Patient voices no other concerns.  Objective: Temp:  [97.9 F (36.6 C)-99.1 F (37.3 C)] 97.9 F (36.6 C) (08/04 0555) Pulse Rate:  [84-120] 84 (08/04 0555) Resp:  [15-23] 18 (08/04 0555) BP: (114-160)/(59-91) 123/63 mmHg (08/04 0555) SpO2:  [97 %-100 %] 100 % (08/04 0555) Weight:  [162 lb 11.2 oz (73.8 kg)-163 lb 9.3 oz (74.2 kg)] 162 lb 11.2 oz (73.8 kg) (08/03 1950) Physical Exam: General: well nourished female in NAD resting in bed Cardiovascular: S1S2 RRR, no murmurs or rubs Respiratory: CTAB, no wheeze, rhonchi or rales Abdomen: soft NT/ND Extremities: WWP, no edema, 4/5 strength on Right UE and LE, 5/5 strength on Left UE and LE.   Neuro: 2-12 grossly in tact, sensation decreased on entire right side compared to left.  Laboratory:  Recent Labs Lab 10/10/13 1521 10/10/13 1547  WBC 7.4  --   HGB 13.3 14.3  HCT 40.6 42.0  PLT 232  --     Recent Labs Lab 10/10/13 1521 10/10/13 1547  NA 142 139  K 4.0 4.0  CL 102 104  CO2 25  --   BUN 16 16  CREATININE 1.85* 2.00*  CALCIUM 8.9  --   PROT 7.1  --   BILITOT 0.2*  --   ALKPHOS 108  --   ALT 20  --  AST 17  --   GLUCOSE 98 101*      Imaging/Diagnostic Tests: EKG (08/04): sinus rhythm Lipids (08/04): Total 204, TG 133, HDL 55, LDL122  Janora Norlander, DO 10/11/2013, 7:28 AM PGY-1, [place] Intern pager: [phone], text pages welcome

## 2013-10-11 NOTE — Progress Notes (Signed)
Received report from Allie Bossier RN in ED at [phone]. Pt arrived to 4N23 at 1951 by Allie Bossier RN by stretcher. Pt slid to bed no problems. Pt complains of HA 8 out of 10 pain scale, but improving. Pt alert and oriented to room, call bell in place, bed alarm on. R side weaker than L, with some sensory deficit. Will continue to monitor.

## 2013-10-11 NOTE — Progress Notes (Signed)
FMTS ATTENDING  NOTE Kehinde Eniola,MD I  have seen and examined this patient, reviewed their chart. I have discussed this patient with the resident. I agree with the resident's findings, assessment and care plan. 

## 2013-10-11 NOTE — Progress Notes (Signed)
CARE MANAGEMENT NOTE 10/11/2013  Patient:  [name], [name]   Account Number:  [id number]  Date Initiated:  10/11/2013  Documentation initiated by:  Olga Coaster  Subjective/Objective Assessment:   ADMITTED FOR STROKE WORKUP     Action/Plan:   CM FOLLOWING FOR DCP   Anticipated DC Date:  10/15/2013   Anticipated DC Plan:  AWAITING FOR PT/OT EVALS FOR DISPOSITION NEEDS     DC Planning Services  CM consult          Status of service:  In process, will continue to follow Medicare Important Message given?   (If response is "NO", the following Medicare IM given date fields will be blank)  Per UR Regulation:  Reviewed for med. necessity/level of care/duration of stay  Comments:  8/4/2015Mindi Slicker RN,BSN,MHA [phone]

## 2013-10-11 NOTE — ED Provider Notes (Signed)
I saw and evaluated the patient, reviewed the resident's note and I agree with the findings and plan.   EKG Interpretation   Date/Time:  Monday October 10 2013 15:27:35 EDT Ventricular Rate:  107 PR Interval:  135 QRS Duration: 81 QT Interval:  323 QTC Calculation: 431 R Axis:   70 Text Interpretation:  Age not entered, assumed to be  63 years old for  purpose of ECG interpretation Sinus tachycardia Baseline wander in lead(s)  V6 besides tachycardia, no significant change since 2008 Confirmed by  Hala Narula  MD, Elfrieda Espino ([id number]) on 10/10/2013 4:00:05 PM       Patient with right-sided weakness in the setting of a headache. Her symptoms started within 4 hours of getting CT scan with negative CT and CT angiogram have low suspicion for subarachnoid hemorrhage given this. Treated her headache with migraine cocktail that did not help or relieve her weakness symptoms. Due to this she will need admission for MRI and stroke rule out. Patient also having atypical chest pain after being in the ED. Will need ACS rule out. Her symptoms are atypical and unlikely to be dissection related given her normal pulses primary headache complaint.  Ephraim Hamburger, MD 10/11/13 [phone]

## 2013-10-12 DIAGNOSIS — IMO0002 Reserved for concepts with insufficient information to code with codable children: Secondary | ICD-10-CM

## 2013-10-12 DIAGNOSIS — G459 Transient cerebral ischemic attack, unspecified: Secondary | ICD-10-CM

## 2013-10-12 LAB — BASIC METABOLIC PANEL
Anion gap: 12 (ref 5–15)
BUN: 18 mg/dL (ref 6–23)
CO2: 23 mEq/L (ref 19–32)
Calcium: 8.6 mg/dL (ref 8.4–10.5)
Chloride: 110 mEq/L (ref 96–112)
Creatinine, Ser: 1.22 mg/dL — ABNORMAL HIGH (ref 0.50–1.10)
GFR calc Af Amer: 54 mL/min — ABNORMAL LOW (ref >90)
GFR calc non Af Amer: 47 mL/min — ABNORMAL LOW (ref >90)
Glucose, Bld: 90 mg/dL (ref 70–99)
Potassium: 4.4 mEq/L (ref 3.7–5.3)
Sodium: 145 mEq/L (ref 137–147)

## 2013-10-12 MED ORDER — CLOPIDOGREL BISULFATE 75 MG PO TABS: 75.0000 mg | ORAL_TABLET | Freq: Every day | ORAL | Status: DC

## 2013-10-12 MED ORDER — CEPHALEXIN 500 MG PO CAPS: 500.0000 mg | ORAL_CAPSULE | Freq: Two times a day (BID) | ORAL | Status: DC

## 2013-10-12 MED ORDER — CEPHALEXIN 500 MG PO CAPS
500.0000 mg | ORAL_CAPSULE | Freq: Two times a day (BID) | ORAL | Status: DC
  Administered 2013-10-12: 500 mg via ORAL
  Filled 2013-10-12: qty 1

## 2013-10-12 MED ORDER — CEPHALEXIN 500 MG PO CAPS: 500.0000 mg | ORAL_CAPSULE | Freq: Four times a day (QID) | ORAL | Status: DC

## 2013-10-12 MED ORDER — PROPRANOLOL HCL 10 MG PO TABS: 10.0000 mg | ORAL_TABLET | Freq: Two times a day (BID) | ORAL | Status: DC

## 2013-10-12 NOTE — Progress Notes (Addendum)
Pt ambulated off unit with belongings at side. Pt picked up by spouse to transport off to disposition. Francis Gaines Sherrian Nunnelley RN.

## 2013-10-12 NOTE — Evaluation (Signed)
Occupational Therapy Evaluation Patient Details Name: [name] MRN: [id number] DOB: 13-Aug-1950 Today's Date: 10/12/2013    History of Present Illness 63 y/o female with PMH Migraines without aura, HTN, occipital neuralgia, GERD, and CKD presents with headache and right sided weakness. CT and MRI negative for acute infarct.    Clinical Impression   Patient evaluated by Occupational Therapy with no further acute OT needs identified. All education has been completed and the patient has no further questions. See below for any follow-up Occupational Therapy or equipment needs. OT to sign off. Thank you for referral.      Follow Up Recommendations  No OT follow up    Equipment Recommendations  None recommended by OT    Recommendations for Other Services       Precautions / Restrictions Precautions Precautions: Fall Precaution Comments: c/o intermittent dizziness  Restrictions Weight Bearing Restrictions: No      Mobility Bed Mobility Overal bed mobility: Independent                Transfers Overall transfer level: Modified independent                    Balance                                            ADL Overall ADL's : Needs assistance/impaired     Grooming: Supervision/safety;Standing   Upper Body Bathing: Supervision/ safety               Toilet Transfer: Supervision/safety           Functional mobility during ADLs: Supervision/safety General ADL Comments: pt is able to complete ADLS but has high balance deficits requiring Supervision level. Pt demonstrates fine motor deficits with task such as "unbuttoning " pants     Vision                     Perception     Praxis      Pertinent Vitals/Pain none     Hand Dominance Right   Extremity/Trunk Assessment Upper Extremity Assessment Upper Extremity Assessment: RUE deficits/detail RUE Deficits / Details: fine motor deficits and 4 out 5 MMT. Pt  provided HEP program for fine motor and theraband level 1 x3 exercise HEP RUE Sensation: decreased light touch (numbness) RUE Coordination: decreased gross motor;decreased fine motor   Lower Extremity Assessment Lower Extremity Assessment: Defer to PT evaluation   Cervical / Trunk Assessment Cervical / Trunk Assessment: Normal   Communication Communication Communication: No difficulties   Cognition Arousal/Alertness: Awake/alert Behavior During Therapy: WFL for tasks assessed/performed Overall Cognitive Status: Within Functional Limits for tasks assessed                     General Comments       Exercises Exercises: Hand exercises     Shoulder Instructions      Home Living Family/patient expects to be discharged to:: Private residence Living Arrangements: Spouse/significant other Available Help at Discharge: Family;Available 24 hours/day Type of Home: House Home Access: Level entry     Home Layout: One level     Bathroom Shower/Tub: Walk-in shower Shower/tub characteristics: Architectural technologist: Standard     Home Equipment: Financial controller: Reacher Additional Comments: husband is disabled but pt reports he can "assist her some"  Prior Functioning/Environment Level of Independence: Independent        Comments: works at [employer]     OT Diagnosis:     OT Problem List:     OT Treatment/Interventions:      OT Goals(Current goals can be found in the care plan section) Acute Rehab OT Goals Patient Stated Goal: to go home  OT Frequency:     Barriers to D/C:            Co-evaluation              End of Session Nurse Communication: Mobility status;Precautions  Activity Tolerance: Patient tolerated treatment well Patient left: in bed;with call bell/phone within reach;with family/visitor present   Time: 3159-4585 OT Time Calculation (min): 20 min Charges:  OT General Charges $OT Visit: 1 Procedure OT  Evaluation $Initial OT Evaluation Tier I: 1 Procedure OT Treatments $Therapeutic Exercise: 8-22 mins G-Codes:    Peri Maris 22-Oct-2013, 2:33 PM Pager: [phone]

## 2013-10-12 NOTE — Discharge Instructions (Signed)
[name], you were admitted for R sided weakness with headache and UTI.  We have ruled out heart pathology at this time.  Please make sure to take your antibiotics and schedule an appointment for hospital follow-up with your primary care physician.

## 2013-10-12 NOTE — Progress Notes (Signed)
Pt A&O x4; pt discharge education and instructions completed with pt and pt denies any questions; voices understanding. Pt IV and telemetry removed from pt; pt provided education handout information on stroke as well as UTI. Pt discharge home with spouse to transport pt home. Will continue to monitor pt till spouse pick her up. P. Amo Alvia Jablonski RN.

## 2013-10-12 NOTE — Progress Notes (Signed)
Echocardiogram 2D Echocardiogram has been performed.  Joelene Millin 10/12/2013, 9:09 AM

## 2013-10-12 NOTE — Progress Notes (Signed)
Subjective: Patient is feeling much better today, no HA, No complaints.   Objective: Current vital signs: BP 98/63  Pulse 75  Temp(Src) 97.9 F (36.6 C) (Oral)  Resp 16  Ht 5\' 4"  (1.626 m)  Wt 73.8 kg (162 lb 11.2 oz)  BMI 27.91 kg/m2  SpO2 98% Vital signs in last 24 hours: Temp:  [97.8 F (36.6 C)-98.2 F (36.8 C)] 97.9 F (36.6 C) (08/05 0539) Pulse Rate:  [75-90] 75 (08/05 0539) Resp:  [16-18] 16 (08/05 0539) BP: (97-119)/(51-71) 98/63 mmHg (08/05 0539) SpO2:  [97 %-99 %] 98 % (08/05 0539)  Intake/Output from previous day: 08/04 0701 - 08/05 0700 In: 600 [P.O.:600] Out: -  Intake/Output this shift:   Nutritional status: General  Neurologic Exam: General: NAD Mental Status: Alert, oriented, thought content appropriate.  Speech fluent without evidence of aphasia.  Able to follow 3 step commands without difficulty. Cranial Nerves: II: Visual fields grossly normal, pupils equal, round, reactive to light and accommodation III,IV, VI: ptosis not present, extra-ocular motions intact bilaterally V,VII: smile symmetric, facial light touch sensation normal bilaterally VIII: hearing normal bilaterally IX,X: gag reflex present XI: bilateral shoulder shrug XII: midline tongue extension without atrophy or fasciculations  Motor: Right : Upper extremity   5/5    Left:     Upper extremity   5/5  Lower extremity   5/5     Lower extremity   5/5 Tone and bulk:normal tone throughout; no atrophy noted Sensory: Pinprick and light touch intact throughout, bilaterally Deep Tendon Reflexes:  Right: Upper Extremity   Left: Upper extremity   biceps (C-5 to C-6) 2/4   biceps (C-5 to C-6) 2/4 tricep (C7) 2/4    triceps (C7) 2/4 Brachioradialis (C6) 2/4  Brachioradialis (C6) 2/4  Lower Extremity Lower Extremity  quadriceps (L-2 to L-4) 2/4   quadriceps (L-2 to L-4) 2/4 Achilles (S1) 2/4   Achilles (S1) 2/4  Plantars: Right: downgoing   Left: downgoing    Lab Results: Basic  Metabolic Panel:  Recent Labs Lab 10/10/13 1521 10/10/13 1547 10/11/13 0845 10/12/13 0512  NA 142 139 142 145  K 4.0 4.0 4.0 4.4  CL 102 104 107 110  CO2 25  --  19 23  GLUCOSE 98 101* 70 90  BUN 16 16 15 18   CREATININE 1.85* 2.00* 1.29* 1.22*  CALCIUM 8.9  --  8.5 8.6    Liver Function Tests:  Recent Labs Lab 10/10/13 1521  AST 17  ALT 20  ALKPHOS 108  BILITOT 0.2*  PROT 7.1  ALBUMIN 4.0   No results found for this basename: LIPASE, AMYLASE,  in the last 168 hours No results found for this basename: AMMONIA,  in the last 168 hours  CBC:  Recent Labs Lab 10/10/13 1521 10/10/13 1547  WBC 7.4  --   NEUTROABS 5.3  --   HGB 13.3 14.3  HCT 40.6 42.0  MCV 87.5  --   PLT 232  --     Cardiac Enzymes:  Recent Labs Lab 10/10/13 2042 10/11/13 0157  TROPONINI <0.30 <0.30    Lipid Panel:  Recent Labs Lab 10/11/13 0157  CHOL 204*  TRIG 133  HDL 55  CHOLHDL 3.7  VLDL 27  LDLCALC 122*    CBG:  Recent Labs Lab 10/10/13 1534  GLUCAP 98    Microbiology: Results for orders placed during the hospital encounter of 10/10/13  URINE CULTURE     Status: None   Collection Time    10/10/13  4:24 PM      Result Value Ref Range Status   Specimen Description URINE, RANDOM   Final   Special Requests ADDED [id number]   Final   Culture  Setup Time     Final   Value: 10/11/2013 04:08     Performed at [facility] Count     Final   Value: >=100,000 COLONIES/ML     Performed at [facility]   Culture     Final   Value: ESCHERICHIA COLI     Performed at [facility]   Report Status PENDING   Incomplete    Coagulation Studies:  Recent Labs  10/10/13 1521  LABPROT 13.5  INR 1.03    Imaging: Ct Angio Head W/cm &/or Wo Cm  10/10/2013   CLINICAL DATA:  62 year old female sudden onset headache and right side weakness. Initial encounter. Code stroke.  EXAM: CT HEAD WITHOUT CONTRAST  CT ANGIOGRAPHY HEAD AND NECK   TECHNIQUE: Multi detector noncontrast CT of the head was performed.  Next, Multidetector CT imaging of the head and neck was performed using the standard protocol during bolus administration of intravenous contrast. Multiplanar CT image reconstructions and MIPs were obtained to evaluate the vascular anatomy. Carotid stenosis measurements (when applicable) are obtained utilizing NASCET criteria, using the distal internal carotid diameter as the denominator.  CONTRAST:  59mL OMNIPAQUE IOHEXOL 350 MG/ML SOLN  COMPARISON:  None.  FINDINGS: CT HEAD FINDINGS  Calvarium intact. Visualized paranasal sinuses and mastoids are clear. Visualized scalp soft tissues are within normal limits. Cerebral volume is within normal limits for age. No midline shift, ventriculomegaly, mass effect, evidence of mass lesion, intracranial hemorrhage or evidence of cortically based acute infarction. Gray-white matter differentiation is within normal limits throughout the brain.  CTA HEAD FINDINGS  No abnormal enhancement of brain parenchyma identified.  VASCULAR FINDINGS:  Major intracranial venous structures appear patent.  Negative intracranial vertebral arteries, the left is mildly dominant. Normal PICA origins. Normal vertebrobasilar junction. Mildly tortuous but otherwise negative basilar artery. Normal SCA origins (the right is incidentally duplicated). PCA origins are within normal limits. Bilateral PCA branches are within normal limits. Posterior communicating arteries are diminutive or absent.  Mild bilateral ICA siphon calcified plaque. No associated ICA siphon stenosis. Ophthalmic artery origins are within normal limits. Patent carotid termini. MCA and ACA origins are within normal limits.  Diminutive anterior communicating artery. Bilateral ACA branches are within normal limits. Right MCA branches are within normal limits.  Left MCA origin and M1 segments are within normal limits. Patent left MCA bifurcation. No left MCA branch  occlusion or stenosis identified.  Review of the MIP images confirms the above findings.  CTA NECK FINDINGS  Negative lung apices. No superior mediastinal lymphadenopathy. Negative thyroid, larynx, pharynx, parapharyngeal spaces, retropharyngeal space, sublingual space, submandibular glands, and parotid glands. Paranasal sinuses are clear. Postoperative changes to the globes, otherwise negative orbits soft tissues. No acute osseous abnormality identified. Sequelae of cervical spine ACDF. Sequelae of left side lower cervical spine laminectomy.  No cervical lymphadenopathy.  VASCULAR FINDINGS:  Mildly bovine arch configuration.  Minimal aortic arch plaque.  Soft and calcified plaque of the brachiocephalic artery without stenosis.  Right CCA origin is patent without stenosis. Negative right CCA proximal to the bifurcation. At the carotid bifurcation there is mostly calcified plaque. Proximal right ICA less than 50 % stenosis with respect to the distal vessel occurs. Mid and distal cervical right ICA are normal.  No  hemodynamically significant proximal right subclavian artery stenosis suspected despite plaque. Normal right vertebral artery origin. Negative cervical right vertebral artery.  Normal left CCA origin. Medial soft plaque in the left CCA without hemodynamically significant stenosis. At the left carotid bifurcation there is mostly calcified plaque. No left ICA origin or bulb stenosis. Normal cervical left ICA otherwise.  Mostly calcified plaque in the proximal left subclavian artery, without hemodynamically significant stenosis. Moderate to severe left vertebral artery origin stenosis (coronal series 503, image 104) appears most likely related soft plaque. The left vertebral artery is patent, with a poststenotic dilatation. The vertebral arteries are fairly codominant in the neck. The cervical left vertebral artery otherwise is normal.  Review of the MIP images confirms the above findings.  IMPRESSION: 1.  Carotid atherosclerosis, but no hemodynamically significant carotid stenosis. Negative right vertebral artery. Moderate to severe atherosclerotic stenosis at the left vertebral artery origin, with otherwise negative left vertebral artery. 2. No intracranial artery stenosis or major circle of Willis branch occlusion. 3.  Normal CT appearance of the brain. Study discussed by telephone with Dr. Alexis Goodell on 10/10/2013 at 16:29 hours.   Electronically Signed   By: Lars Pinks M.D.   On: 10/10/2013 16:30   Ct Head Wo Contrast  10/10/2013   CLINICAL DATA:  63 year old female sudden onset headache and right side weakness. Initial encounter. Code stroke.  EXAM: CT HEAD WITHOUT CONTRAST  CT ANGIOGRAPHY HEAD AND NECK  TECHNIQUE: Multi detector noncontrast CT of the head was performed.  Next, Multidetector CT imaging of the head and neck was performed using the standard protocol during bolus administration of intravenous contrast. Multiplanar CT image reconstructions and MIPs were obtained to evaluate the vascular anatomy. Carotid stenosis measurements (when applicable) are obtained utilizing NASCET criteria, using the distal internal carotid diameter as the denominator.  CONTRAST:  66mL OMNIPAQUE IOHEXOL 350 MG/ML SOLN  COMPARISON:  None.  FINDINGS: CT HEAD FINDINGS  Calvarium intact. Visualized paranasal sinuses and mastoids are clear. Visualized scalp soft tissues are within normal limits. Cerebral volume is within normal limits for age. No midline shift, ventriculomegaly, mass effect, evidence of mass lesion, intracranial hemorrhage or evidence of cortically based acute infarction. Gray-white matter differentiation is within normal limits throughout the brain.  CTA HEAD FINDINGS  No abnormal enhancement of brain parenchyma identified.  VASCULAR FINDINGS:  Major intracranial venous structures appear patent.  Negative intracranial vertebral arteries, the left is mildly dominant. Normal PICA origins. Normal  vertebrobasilar junction. Mildly tortuous but otherwise negative basilar artery. Normal SCA origins (the right is incidentally duplicated). PCA origins are within normal limits. Bilateral PCA branches are within normal limits. Posterior communicating arteries are diminutive or absent.  Mild bilateral ICA siphon calcified plaque. No associated ICA siphon stenosis. Ophthalmic artery origins are within normal limits. Patent carotid termini. MCA and ACA origins are within normal limits.  Diminutive anterior communicating artery. Bilateral ACA branches are within normal limits. Right MCA branches are within normal limits.  Left MCA origin and M1 segments are within normal limits. Patent left MCA bifurcation. No left MCA branch occlusion or stenosis identified.  Review of the MIP images confirms the above findings.  CTA NECK FINDINGS  Negative lung apices. No superior mediastinal lymphadenopathy. Negative thyroid, larynx, pharynx, parapharyngeal spaces, retropharyngeal space, sublingual space, submandibular glands, and parotid glands. Paranasal sinuses are clear. Postoperative changes to the globes, otherwise negative orbits soft tissues. No acute osseous abnormality identified. Sequelae of cervical spine ACDF. Sequelae of left side lower cervical spine  laminectomy.  No cervical lymphadenopathy.  VASCULAR FINDINGS:  Mildly bovine arch configuration.  Minimal aortic arch plaque.  Soft and calcified plaque of the brachiocephalic artery without stenosis.  Right CCA origin is patent without stenosis. Negative right CCA proximal to the bifurcation. At the carotid bifurcation there is mostly calcified plaque. Proximal right ICA less than 50 % stenosis with respect to the distal vessel occurs. Mid and distal cervical right ICA are normal.  No hemodynamically significant proximal right subclavian artery stenosis suspected despite plaque. Normal right vertebral artery origin. Negative cervical right vertebral artery.  Normal left  CCA origin. Medial soft plaque in the left CCA without hemodynamically significant stenosis. At the left carotid bifurcation there is mostly calcified plaque. No left ICA origin or bulb stenosis. Normal cervical left ICA otherwise.  Mostly calcified plaque in the proximal left subclavian artery, without hemodynamically significant stenosis. Moderate to severe left vertebral artery origin stenosis (coronal series 503, image 104) appears most likely related soft plaque. The left vertebral artery is patent, with a poststenotic dilatation. The vertebral arteries are fairly codominant in the neck. The cervical left vertebral artery otherwise is normal.  Review of the MIP images confirms the above findings.  IMPRESSION: 1. Carotid atherosclerosis, but no hemodynamically significant carotid stenosis. Negative right vertebral artery. Moderate to severe atherosclerotic stenosis at the left vertebral artery origin, with otherwise negative left vertebral artery. 2. No intracranial artery stenosis or major circle of Willis branch occlusion. 3.  Normal CT appearance of the brain. Study discussed by telephone with Dr. Alexis Goodell on 10/10/2013 at 16:29 hours.   Electronically Signed   By: Lars Pinks M.D.   On: 10/10/2013 16:30   Ct Angio Neck W/cm &/or Wo/cm  10/10/2013   CLINICAL DATA:  63 year old female sudden onset headache and right side weakness. Initial encounter. Code stroke.  EXAM: CT HEAD WITHOUT CONTRAST  CT ANGIOGRAPHY HEAD AND NECK  TECHNIQUE: Multi detector noncontrast CT of the head was performed.  Next, Multidetector CT imaging of the head and neck was performed using the standard protocol during bolus administration of intravenous contrast. Multiplanar CT image reconstructions and MIPs were obtained to evaluate the vascular anatomy. Carotid stenosis measurements (when applicable) are obtained utilizing NASCET criteria, using the distal internal carotid diameter as the denominator.  CONTRAST:  93mL OMNIPAQUE  IOHEXOL 350 MG/ML SOLN  COMPARISON:  None.  FINDINGS: CT HEAD FINDINGS  Calvarium intact. Visualized paranasal sinuses and mastoids are clear. Visualized scalp soft tissues are within normal limits. Cerebral volume is within normal limits for age. No midline shift, ventriculomegaly, mass effect, evidence of mass lesion, intracranial hemorrhage or evidence of cortically based acute infarction. Gray-white matter differentiation is within normal limits throughout the brain.  CTA HEAD FINDINGS  No abnormal enhancement of brain parenchyma identified.  VASCULAR FINDINGS:  Major intracranial venous structures appear patent.  Negative intracranial vertebral arteries, the left is mildly dominant. Normal PICA origins. Normal vertebrobasilar junction. Mildly tortuous but otherwise negative basilar artery. Normal SCA origins (the right is incidentally duplicated). PCA origins are within normal limits. Bilateral PCA branches are within normal limits. Posterior communicating arteries are diminutive or absent.  Mild bilateral ICA siphon calcified plaque. No associated ICA siphon stenosis. Ophthalmic artery origins are within normal limits. Patent carotid termini. MCA and ACA origins are within normal limits.  Diminutive anterior communicating artery. Bilateral ACA branches are within normal limits. Right MCA branches are within normal limits.  Left MCA origin and M1 segments are within normal limits.  Patent left MCA bifurcation. No left MCA branch occlusion or stenosis identified.  Review of the MIP images confirms the above findings.  CTA NECK FINDINGS  Negative lung apices. No superior mediastinal lymphadenopathy. Negative thyroid, larynx, pharynx, parapharyngeal spaces, retropharyngeal space, sublingual space, submandibular glands, and parotid glands. Paranasal sinuses are clear. Postoperative changes to the globes, otherwise negative orbits soft tissues. No acute osseous abnormality identified. Sequelae of cervical spine ACDF.  Sequelae of left side lower cervical spine laminectomy.  No cervical lymphadenopathy.  VASCULAR FINDINGS:  Mildly bovine arch configuration.  Minimal aortic arch plaque.  Soft and calcified plaque of the brachiocephalic artery without stenosis.  Right CCA origin is patent without stenosis. Negative right CCA proximal to the bifurcation. At the carotid bifurcation there is mostly calcified plaque. Proximal right ICA less than 50 % stenosis with respect to the distal vessel occurs. Mid and distal cervical right ICA are normal.  No hemodynamically significant proximal right subclavian artery stenosis suspected despite plaque. Normal right vertebral artery origin. Negative cervical right vertebral artery.  Normal left CCA origin. Medial soft plaque in the left CCA without hemodynamically significant stenosis. At the left carotid bifurcation there is mostly calcified plaque. No left ICA origin or bulb stenosis. Normal cervical left ICA otherwise.  Mostly calcified plaque in the proximal left subclavian artery, without hemodynamically significant stenosis. Moderate to severe left vertebral artery origin stenosis (coronal series 503, image 104) appears most likely related soft plaque. The left vertebral artery is patent, with a poststenotic dilatation. The vertebral arteries are fairly codominant in the neck. The cervical left vertebral artery otherwise is normal.  Review of the MIP images confirms the above findings.  IMPRESSION: 1. Carotid atherosclerosis, but no hemodynamically significant carotid stenosis. Negative right vertebral artery. Moderate to severe atherosclerotic stenosis at the left vertebral artery origin, with otherwise negative left vertebral artery. 2. No intracranial artery stenosis or major circle of Willis branch occlusion. 3.  Normal CT appearance of the brain. Study discussed by telephone with Dr. Alexis Goodell on 10/10/2013 at 16:29 hours.   Electronically Signed   By: Lars Pinks M.D.   On:  10/10/2013 16:30   Mr Brain Wo Contrast  10/11/2013   CLINICAL DATA:  63 year old female with headache, pain radiating to the posterior head and shoulders. Dizziness, fall, right lower extremity weakness. Initial encounter.  EXAM: MRI HEAD WITHOUT CONTRAST  TECHNIQUE: Multiplanar, multiecho pulse sequences of the brain and surrounding structures were obtained without intravenous contrast.  COMPARISON:  CTA head and neck 10/10/2013, and head CT.  FINDINGS: Cerebral volume is within normal limits for age. No restricted diffusion to suggest acute infarction. No midline shift, mass effect, evidence of mass lesion, ventriculomegaly, extra-axial collection or acute intracranial hemorrhage. Cervicomedullary junction and pituitary are within normal limits. Major intracranial vascular flow voids are preserved.  Cervical spine degeneration, with evidence of multifactorial spinal stenosis at C2-C3 and C3-C4.  Minimal to mild for age scattered cerebral white matter T2 and FLAIR hyperintensity, configuration is nonspecific. No cortical encephalomalacia. Otherwise normal gray and white matter signal.  Visible internal auditory structures appear normal. Mastoids are clear. Postoperative changes to the globes. Negative paranasal sinuses. Visualized scalp soft tissues are within normal limits. Normal bone marrow signal.  IMPRESSION: 1.  No acute intracranial abnormality. 2. Mild for age nonspecific cerebral white matter signal changes. 3. 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   Electronically Signed   By: Lars Pinks M.D.   On: 10/11/2013 11:33  Dg Chest Portable 1 View  10/10/2013   CLINICAL DATA:  Chest pain and difficulty breathing  EXAM: PORTABLE CHEST - 1 VIEW  COMPARISON:  December 27, 2012  FINDINGS: There is underlying emphysematous change. There is mild scarring in the left base. There is no edema or consolidation. The heart size and pulmonary vascularity are within normal limits.  No adenopathy. There is postoperative change in the lower cervical spine region.  IMPRESSION: Underlying emphysema. Mild scarring left base. No edema or consolidation.   Electronically Signed   By: Lowella Grip M.D.   On: 10/10/2013 17:41    Medications:  Scheduled: . cephALEXin  500 mg Oral 4 times per day  . clidinium-chlordiazePOXIDE  1 capsule Oral QAC breakfast  . clopidogrel  75 mg Oral Daily  . doxepin  100 mg Oral QHS  . gabapentin  300 mg Oral QHS  . heparin  5,000 Units Subcutaneous 3 times per day  . pantoprazole  40 mg Oral Daily  . propranolol  10 mg Oral BID  . ramipril  5 mg Oral Daily  . sodium chloride  3 mL Intravenous Q12H  . topiramate  100 mg Oral QHS    Assessment/Plan: Patient with no HA today and returned bilateral UE and LE strength. MRI brain and both CTA head and neck normal.   Recommend: 1) Continue Plavix.   No further neurologic intervention is recommended at this time.  If further questions arise, please call or page at that time.  Thank you for allowing neurology to participate in the care of this patient.  Etta Quill PA-C [facility] [phone]  10/12/2013, 9:51 AM    Etta Quill PA-C [facility] [phone]  10/12/2013, 9:46 AM

## 2013-10-12 NOTE — Plan of Care (Signed)
Problem: Acute Rehab PT Goals(only PT should resolve) Goal: PT Additional Goal #1 Will improve DGI >19 to reduce risk of falls.

## 2013-10-12 NOTE — Evaluation (Addendum)
Physical Therapy Evaluation Patient Details Name: [name] MRN: [id number] DOB: [birth year] Today's Date: 10/12/2013   History of Present Illness  63 y/o female with PMH Migraines without aura, HTN, occipital neuralgia, GERD, and CKD presents with headache and right sided weakness. CT and MRI negative for acute infarct.   Clinical Impression  Pt adm due to above. Presents with Rt sided weakness and high level balance deficits due to weakness and dizziness. Pt to benefit from continued acute PT to address deficits and maximize functional mobility prior to D/C home. Educated pt on signs and symptoms of stroke and importance of early detection. Recommend OPPT upon acute D/C for further strengthening of Rt LE and balance deficits.     Follow Up Recommendations Outpatient PT;Supervision - Intermittent;Other (comment) (high level balance activities )    Equipment Recommendations  None recommended by PT    Recommendations for Other Services OT consult     Precautions / Restrictions Precautions Precautions: Fall Precaution Comments: c/o intermittent dizziness  Restrictions Weight Bearing Restrictions: No      Mobility  Bed Mobility Overal bed mobility: Independent                Transfers Overall transfer level: Modified independent Equipment used: None             General transfer comment: no LOB noted  Ambulation/Gait Ambulation/Gait assistance: Supervision;Min guard Ambulation Distance (Feet): 400 Feet Assistive device: None Gait Pattern/deviations: Drifts right/left;Narrow base of support;Decreased stride length Gait velocity: decr/guarded Gait velocity interpretation: Below normal speed for age/gender General Gait Details: pt tends to veer to Rt with high level balance activities; min guard with portions of DGI; pt with difficulty reaching to ground due to Rt sided weakness; cues for safety ; see DGI below   Stairs Stairs: Yes Stairs assistance:  Supervision Stair Management: One rail Right;Alternating pattern;Forwards Number of Stairs: 2 General stair comments: supervision for safety  Wheelchair Mobility    Modified Rankin (Stroke Patients Only) Modified Rankin (Stroke Patients Only) Pre-Morbid Rankin Score: No symptoms Modified Rankin: Moderately severe disability     Balance Overall balance assessment: Needs assistance                           High level balance activites: Backward walking;Sudden stops;Head turns;Turns;Direction changes High Level Balance Comments: see DGI below; pt unsteady with high level balance activities; veers to Rt intermittently  Standardized Balance Assessment Standardized Balance Assessment : Dynamic Gait Index   Dynamic Gait Index Level Surface: Mild Impairment Change in Gait Speed: Mild Impairment Gait with Horizontal Head Turns: Normal Gait with Vertical Head Turns: Mild Impairment (c/o dizziness and slows down ) Gait and Pivot Turn: Mild Impairment Step Over Obstacle: Mild Impairment Step Around Obstacles: Normal Steps: Mild Impairment Total Score: 18       Pertinent Vitals/Pain Denies any pain     Home Living Family/patient expects to be discharged to:: Private residence Living Arrangements: Spouse/significant other Available Help at Discharge: Family;Available 24 hours/day Type of Home: House Home Access: Level entry     Home Layout: One level Home Equipment: Adaptive equipment Additional Comments: husband is disabled but pt reports he can "assist her some"     Prior Function Level of Independence: Independent         Comments: works at [employer]: Right    Extremity/Trunk Assessment   Upper Extremity Assessment: Defer to  OT evaluation           Lower Extremity Assessment: RLE deficits/detail RLE Deficits / Details: hip 3/5; quad 3/5; incr shakiness with MMT on Rt vs Lt side    Cervical / Trunk Assessment:  Normal  Communication   Communication: No difficulties  Cognition Arousal/Alertness: Awake/alert Behavior During Therapy: WFL for tasks assessed/performed Overall Cognitive Status: Within Functional Limits for tasks assessed                      General Comments General comments (skin integrity, edema, etc.): pt denies any changes in vision; c/o intermittent dizziness with balance activities     Exercises        Assessment/Plan    PT Assessment Patient needs continued PT services  PT Diagnosis Abnormality of gait;Generalized weakness   PT Problem List Decreased strength;Decreased balance;Decreased mobility  PT Treatment Interventions Gait training;DME instruction;Functional mobility training;Therapeutic activities;Therapeutic exercise;Neuromuscular re-education;Balance training;Cognitive remediation   PT Goals (Current goals can be found in the Care Plan section) Acute Rehab PT Goals Patient Stated Goal: to go home PT Goal Formulation: With patient Time For Goal Achievement: 10/15/13 Potential to Achieve Goals: Good    Frequency Min 4X/week   Barriers to discharge        Co-evaluation               End of Session Equipment Utilized During Treatment: Gait belt Activity Tolerance: Patient tolerated treatment well Patient left: in chair;with call bell/phone within reach Nurse Communication: Mobility status         Time: 4193-7902 PT Time Calculation (min): 18 min   Charges:   PT Evaluation $Initial PT Evaluation Tier I: 1 Procedure PT Treatments $Gait Training: 8-22 mins   PT G CodesGustavus Bryant, [place] [facility] 10/12/2013, 9:59 AM

## 2013-10-13 LAB — URINE CULTURE: Colony Count: >=100000

## 2013-10-13 NOTE — Discharge Summary (Signed)
[facility] Discharge Summary  Patient name: [name] Medical record number: [id number] Date of birth: [birth year] Age: 63 y.o. Gender: female Date of Admission: 10/10/2013  Date of Discharge: 10/12/2013 Admitting Physician: Lupita Dawn, MD  Primary Care Provider: Vidal Schwalbe, MD Consultants: Neurology, PT/OT  Indication for Hospitalization: Debilitating migraine with neurological symptoms  Discharge Diagnoses/Problem List:  Right sided weakness Migraine headache Hypertension UTI Chest pain w/ SOB GERD Chronic kidney disease  Disposition: Patient discharged to home.  Discharge Condition: stable.  Discharge Exam:  GEN: alert female, sitting in bed, NAD EENT: AT/[place], PERRL, EOMI CARDIO: S1S2 RRR, no murmurs or gallops PULM: CTAB, no wheezes/ rhonchi ABDOMEN: soft, NT/ND, +BS x4 EXTREMITIES: WWP, no edema, Left UE and LE 5/5 strength, Right UE and LE 4/5 NEURO: sensation improved but still decreased on right side compared to left side of face, UE and LE, CN 2-12 grossly intact  Brief Hospital Course:  HPI: [name] is a 63 yo F presenting with a severe headache associated with Right sided weakness and numbness.  She reports that she developed the headache around 11am on 08/03.  Headache radiated around the back of her head.  She reports that around noon she stood up from her computer and fell.  At this time, she felt a sensation of the right sude of her tongue swelling and noted right sided numbness of her face.  She also reports developing a hoarse voice at that time.  Once her dizziness stopped, she drove herself to urgent care and reports difficulty forming her words during her office visit.  She admits to urinary urgency and increased urinary frequency x2 day.  Denies dysuria, hematuria, change in urine color or smell or abdominal pain.  She also reports developing SOB and sharp, intermittent Left sided chest pain upon arrival to the  ED.   Right sided weakness: Patient was admitted to [facility] under Dr Ree Kida.  Neurology was consulted. Imaging studies included: CT head w/out contrast, a CTA head and neck, and MRI without contrast were ordered and revealed no acute intracranial abnormalities.  Carotid dopplers revealed 40-59% stenosis of R internal carotid and 1-39% stenosis of L internal carotid. 2D echo revealed no significant valvular abnormalities.  Neuro checks were performed every 2 hours and Plavix was given daily.  Lipid panel, Hgb A1c were obtained.  Patient was also seen by PT/OT and was up and ambulating independently upon discharge.         Migraine headache: Patient received valproate 500mg  in ED and was put on Tylenol and Zofran PRN.  She continued her home meds of topamax, doxepin and gabapentin.  In addition, she was prescribed Propranolol for prophylaxis.   HTN: BP remained stable during hospitalization.  BP was monitored per floor protocol.  Patient continued home Altace but lasix was discontinued.  Patient to f/u out patient regarding the continued use of Lasix.    UTI:  UA revealed positive nitrites, moderate leukocytes and many bacteria on microscopy.  Urine culture revealed pansensitive E.Coli.  Patient received Rocephin IV daily until culture sensitivities resulted and was transitioned and sent home on Keflex 500mg  PO BID.    Chest pain w/SOB, likely anxiety:  EKG revealed sinus tachycardia but was otherwise similar to her EKG in 2014.  Troponins were negative x3.  CXR revealed emphysema but no acute abnormalities. Nitro SL PRN.  GERD: Patient was on Nexium at home.  Patient given Protonix in hospital.  CKD:  Cr  on admission was 2.0 and 1.22 upon discharge.  Daily BMET ordered and patient was hydrated gently IV @50cc /h.  Issues for Follow Up:  Patient to f/u with neurology out patient.  She is also to receive PT outpatient for strengthening of right UE and LE and balance issues.  Significant  Procedures: none  Significant Labs and Imaging:   Recent Labs Lab 10/10/13 1521 10/10/13 1547  WBC 7.4  --   HGB 13.3 14.3  HCT 40.6 42.0  PLT 232  --     Recent Labs Lab 10/10/13 1521 10/10/13 1547 10/11/13 0845 10/12/13 0512  NA 142 139 142 145  K 4.0 4.0 4.0 4.4  CL 102 104 107 110  CO2 25  --  19 23  GLUCOSE 98 101* 70 90  BUN 16 16 15 18   CREATININE 1.85* 2.00* 1.29* 1.22*  CALCIUM 8.9  --  8.5 8.6  ALKPHOS 108  --   --   --   AST 17  --   --   --   ALT 20  --   --   --   ALBUMIN 4.0  --   --   --     Troponin [facility] Test)  Recent Labs  10/10/13 1908  TROPIPOC 0.00   Cardiac Panel (last 3 results)  Recent Labs  10/10/13 2042 10/11/13 0157  TROPONINI <0.30 <0.30   Ct Angio Head W/cm &/or Wo Cm  10/10/2013   CLINICAL DATA:  63 year old female sudden onset headache and right side weakness. Initial encounter. Code stroke.  EXAM: CT HEAD WITHOUT CONTRAST  CT ANGIOGRAPHY HEAD AND NECK  TECHNIQUE: Multi detector noncontrast CT of the head was performed.  Next, Multidetector CT imaging of the head and neck was performed using the standard protocol during bolus administration of intravenous contrast. Multiplanar CT image reconstructions and MIPs were obtained to evaluate the vascular anatomy. Carotid stenosis measurements (when applicable) are obtained utilizing NASCET criteria, using the distal internal carotid diameter as the denominator.  CONTRAST:  25mL OMNIPAQUE IOHEXOL 350 MG/ML SOLN  COMPARISON:  None.  FINDINGS: CT HEAD FINDINGS  Calvarium intact. Visualized paranasal sinuses and mastoids are clear. Visualized scalp soft tissues are within normal limits. Cerebral volume is within normal limits for age. No midline shift, ventriculomegaly, mass effect, evidence of mass lesion, intracranial hemorrhage or evidence of cortically based acute infarction. Gray-white matter differentiation is within normal limits throughout the brain.  CTA HEAD FINDINGS  No  abnormal enhancement of brain parenchyma identified.  VASCULAR FINDINGS:  Major intracranial venous structures appear patent.  Negative intracranial vertebral arteries, the left is mildly dominant. Normal PICA origins. Normal vertebrobasilar junction. Mildly tortuous but otherwise negative basilar artery. Normal SCA origins (the right is incidentally duplicated). PCA origins are within normal limits. Bilateral PCA branches are within normal limits. Posterior communicating arteries are diminutive or absent.  Mild bilateral ICA siphon calcified plaque. No associated ICA siphon stenosis. Ophthalmic artery origins are within normal limits. Patent carotid termini. MCA and ACA origins are within normal limits.  Diminutive anterior communicating artery. Bilateral ACA branches are within normal limits. Right MCA branches are within normal limits.  Left MCA origin and M1 segments are within normal limits. Patent left MCA bifurcation. No left MCA branch occlusion or stenosis identified.  Review of the MIP images confirms the above findings.  CTA NECK FINDINGS  Negative lung apices. No superior mediastinal lymphadenopathy. Negative thyroid, larynx, pharynx, parapharyngeal spaces, retropharyngeal space, sublingual space, submandibular glands, and parotid  glands. Paranasal sinuses are clear. Postoperative changes to the globes, otherwise negative orbits soft tissues. No acute osseous abnormality identified. Sequelae of cervical spine ACDF. Sequelae of left side lower cervical spine laminectomy.  No cervical lymphadenopathy.  VASCULAR FINDINGS:  Mildly bovine arch configuration.  Minimal aortic arch plaque.  Soft and calcified plaque of the brachiocephalic artery without stenosis.  Right CCA origin is patent without stenosis. Negative right CCA proximal to the bifurcation. At the carotid bifurcation there is mostly calcified plaque. Proximal right ICA less than 50 % stenosis with respect to the distal vessel occurs. Mid and  distal cervical right ICA are normal.  No hemodynamically significant proximal right subclavian artery stenosis suspected despite plaque. Normal right vertebral artery origin. Negative cervical right vertebral artery.  Normal left CCA origin. Medial soft plaque in the left CCA without hemodynamically significant stenosis. At the left carotid bifurcation there is mostly calcified plaque. No left ICA origin or bulb stenosis. Normal cervical left ICA otherwise.  Mostly calcified plaque in the proximal left subclavian artery, without hemodynamically significant stenosis. Moderate to severe left vertebral artery origin stenosis (coronal series 503, image 104) appears most likely related soft plaque. The left vertebral artery is patent, with a poststenotic dilatation. The vertebral arteries are fairly codominant in the neck. The cervical left vertebral artery otherwise is normal.  Review of the MIP images confirms the above findings.  IMPRESSION: 1. Carotid atherosclerosis, but no hemodynamically significant carotid stenosis. Negative right vertebral artery. Moderate to severe atherosclerotic stenosis at the left vertebral artery origin, with otherwise negative left vertebral artery. 2. No intracranial artery stenosis or major circle of Willis branch occlusion. 3.  Normal CT appearance of the brain. Study discussed by telephone with Dr. Alexis Goodell on 10/10/2013 at 16:29 hours.   Electronically Signed   By: Lars Pinks M.D.   On: 10/10/2013 16:30   Ct Head Wo Contrast  10/10/2013   CLINICAL DATA:  63 year old female sudden onset headache and right side weakness. Initial encounter. Code stroke.  EXAM: CT HEAD WITHOUT CONTRAST  CT ANGIOGRAPHY HEAD AND NECK  TECHNIQUE: Multi detector noncontrast CT of the head was performed.  Next, Multidetector CT imaging of the head and neck was performed using the standard protocol during bolus administration of intravenous contrast. Multiplanar CT image reconstructions and MIPs were  obtained to evaluate the vascular anatomy. Carotid stenosis measurements (when applicable) are obtained utilizing NASCET criteria, using the distal internal carotid diameter as the denominator.  CONTRAST:  77mL OMNIPAQUE IOHEXOL 350 MG/ML SOLN  COMPARISON:  None.  FINDINGS: CT HEAD FINDINGS  Calvarium intact. Visualized paranasal sinuses and mastoids are clear. Visualized scalp soft tissues are within normal limits. Cerebral volume is within normal limits for age. No midline shift, ventriculomegaly, mass effect, evidence of mass lesion, intracranial hemorrhage or evidence of cortically based acute infarction. Gray-white matter differentiation is within normal limits throughout the brain.  CTA HEAD FINDINGS  No abnormal enhancement of brain parenchyma identified.  VASCULAR FINDINGS:  Major intracranial venous structures appear patent.  Negative intracranial vertebral arteries, the left is mildly dominant. Normal PICA origins. Normal vertebrobasilar junction. Mildly tortuous but otherwise negative basilar artery. Normal SCA origins (the right is incidentally duplicated). PCA origins are within normal limits. Bilateral PCA branches are within normal limits. Posterior communicating arteries are diminutive or absent.  Mild bilateral ICA siphon calcified plaque. No associated ICA siphon stenosis. Ophthalmic artery origins are within normal limits. Patent carotid termini. MCA and ACA origins are within normal limits.  Diminutive anterior communicating artery. Bilateral ACA branches are within normal limits. Right MCA branches are within normal limits.  Left MCA origin and M1 segments are within normal limits. Patent left MCA bifurcation. No left MCA branch occlusion or stenosis identified.  Review of the MIP images confirms the above findings.  CTA NECK FINDINGS  Negative lung apices. No superior mediastinal lymphadenopathy. Negative thyroid, larynx, pharynx, parapharyngeal spaces, retropharyngeal space, sublingual space,  submandibular glands, and parotid glands. Paranasal sinuses are clear. Postoperative changes to the globes, otherwise negative orbits soft tissues. No acute osseous abnormality identified. Sequelae of cervical spine ACDF. Sequelae of left side lower cervical spine laminectomy.  No cervical lymphadenopathy.  VASCULAR FINDINGS:  Mildly bovine arch configuration.  Minimal aortic arch plaque.  Soft and calcified plaque of the brachiocephalic artery without stenosis.  Right CCA origin is patent without stenosis. Negative right CCA proximal to the bifurcation. At the carotid bifurcation there is mostly calcified plaque. Proximal right ICA less than 50 % stenosis with respect to the distal vessel occurs. Mid and distal cervical right ICA are normal.  No hemodynamically significant proximal right subclavian artery stenosis suspected despite plaque. Normal right vertebral artery origin. Negative cervical right vertebral artery.  Normal left CCA origin. Medial soft plaque in the left CCA without hemodynamically significant stenosis. At the left carotid bifurcation there is mostly calcified plaque. No left ICA origin or bulb stenosis. Normal cervical left ICA otherwise.  Mostly calcified plaque in the proximal left subclavian artery, without hemodynamically significant stenosis. Moderate to severe left vertebral artery origin stenosis (coronal series 503, image 104) appears most likely related soft plaque. The left vertebral artery is patent, with a poststenotic dilatation. The vertebral arteries are fairly codominant in the neck. The cervical left vertebral artery otherwise is normal.  Review of the MIP images confirms the above findings.  IMPRESSION: 1. Carotid atherosclerosis, but no hemodynamically significant carotid stenosis. Negative right vertebral artery. Moderate to severe atherosclerotic stenosis at the left vertebral artery origin, with otherwise negative left vertebral artery. 2. No intracranial artery stenosis or  major circle of Willis branch occlusion. 3.  Normal CT appearance of the brain. Study discussed by telephone with Dr. Alexis Goodell on 10/10/2013 at 16:29 hours.   Electronically Signed   By: Lars Pinks M.D.   On: 10/10/2013 16:30   Ct Angio Neck W/cm &/or Wo/cm  10/10/2013   CLINICAL DATA:  63 year old female sudden onset headache and right side weakness. Initial encounter. Code stroke.  EXAM: CT HEAD WITHOUT CONTRAST  CT ANGIOGRAPHY HEAD AND NECK  TECHNIQUE: Multi detector noncontrast CT of the head was performed.  Next, Multidetector CT imaging of the head and neck was performed using the standard protocol during bolus administration of intravenous contrast. Multiplanar CT image reconstructions and MIPs were obtained to evaluate the vascular anatomy. Carotid stenosis measurements (when applicable) are obtained utilizing NASCET criteria, using the distal internal carotid diameter as the denominator.  CONTRAST:  61mL OMNIPAQUE IOHEXOL 350 MG/ML SOLN  COMPARISON:  None.  FINDINGS: CT HEAD FINDINGS  Calvarium intact. Visualized paranasal sinuses and mastoids are clear. Visualized scalp soft tissues are within normal limits. Cerebral volume is within normal limits for age. No midline shift, ventriculomegaly, mass effect, evidence of mass lesion, intracranial hemorrhage or evidence of cortically based acute infarction. Gray-white matter differentiation is within normal limits throughout the brain.  CTA HEAD FINDINGS  No abnormal enhancement of brain parenchyma identified.  VASCULAR FINDINGS:  Major intracranial venous structures appear patent.  Negative  intracranial vertebral arteries, the left is mildly dominant. Normal PICA origins. Normal vertebrobasilar junction. Mildly tortuous but otherwise negative basilar artery. Normal SCA origins (the right is incidentally duplicated). PCA origins are within normal limits. Bilateral PCA branches are within normal limits. Posterior communicating arteries are diminutive or  absent.  Mild bilateral ICA siphon calcified plaque. No associated ICA siphon stenosis. Ophthalmic artery origins are within normal limits. Patent carotid termini. MCA and ACA origins are within normal limits.  Diminutive anterior communicating artery. Bilateral ACA branches are within normal limits. Right MCA branches are within normal limits.  Left MCA origin and M1 segments are within normal limits. Patent left MCA bifurcation. No left MCA branch occlusion or stenosis identified.  Review of the MIP images confirms the above findings.  CTA NECK FINDINGS  Negative lung apices. No superior mediastinal lymphadenopathy. Negative thyroid, larynx, pharynx, parapharyngeal spaces, retropharyngeal space, sublingual space, submandibular glands, and parotid glands. Paranasal sinuses are clear. Postoperative changes to the globes, otherwise negative orbits soft tissues. No acute osseous abnormality identified. Sequelae of cervical spine ACDF. Sequelae of left side lower cervical spine laminectomy.  No cervical lymphadenopathy.  VASCULAR FINDINGS:  Mildly bovine arch configuration.  Minimal aortic arch plaque.  Soft and calcified plaque of the brachiocephalic artery without stenosis.  Right CCA origin is patent without stenosis. Negative right CCA proximal to the bifurcation. At the carotid bifurcation there is mostly calcified plaque. Proximal right ICA less than 50 % stenosis with respect to the distal vessel occurs. Mid and distal cervical right ICA are normal.  No hemodynamically significant proximal right subclavian artery stenosis suspected despite plaque. Normal right vertebral artery origin. Negative cervical right vertebral artery.  Normal left CCA origin. Medial soft plaque in the left CCA without hemodynamically significant stenosis. At the left carotid bifurcation there is mostly calcified plaque. No left ICA origin or bulb stenosis. Normal cervical left ICA otherwise.  Mostly calcified plaque in the proximal left  subclavian artery, without hemodynamically significant stenosis. Moderate to severe left vertebral artery origin stenosis (coronal series 503, image 104) appears most likely related soft plaque. The left vertebral artery is patent, with a poststenotic dilatation. The vertebral arteries are fairly codominant in the neck. The cervical left vertebral artery otherwise is normal.  Review of the MIP images confirms the above findings.  IMPRESSION: 1. Carotid atherosclerosis, but no hemodynamically significant carotid stenosis. Negative right vertebral artery. Moderate to severe atherosclerotic stenosis at the left vertebral artery origin, with otherwise negative left vertebral artery. 2. No intracranial artery stenosis or major circle of Willis branch occlusion. 3.  Normal CT appearance of the brain. Study discussed by telephone with Dr. Alexis Goodell on 10/10/2013 at 16:29 hours.   Electronically Signed   By: Lars Pinks M.D.   On: 10/10/2013 16:30   Mr Brain Wo Contrast  10/11/2013   CLINICAL DATA:  63 year old female with headache, pain radiating to the posterior head and shoulders. Dizziness, fall, right lower extremity weakness. Initial encounter.  EXAM: MRI HEAD WITHOUT CONTRAST  TECHNIQUE: Multiplanar, multiecho pulse sequences of the brain and surrounding structures were obtained without intravenous contrast.  COMPARISON:  CTA head and neck 10/10/2013, and head CT.  FINDINGS: Cerebral volume is within normal limits for age. No restricted diffusion to suggest acute infarction. No midline shift, mass effect, evidence of mass lesion, ventriculomegaly, extra-axial collection or acute intracranial hemorrhage. Cervicomedullary junction and pituitary are within normal limits. Major intracranial vascular flow voids are preserved.  Cervical spine degeneration, with evidence of  multifactorial spinal stenosis at C2-C3 and C3-C4.  Minimal to mild for age scattered cerebral white matter T2 and FLAIR hyperintensity,  configuration is nonspecific. No cortical encephalomalacia. Otherwise normal gray and white matter signal.  Visible internal auditory structures appear normal. Mastoids are clear. Postoperative changes to the globes. Negative paranasal sinuses. Visualized scalp soft tissues are within normal limits. Normal bone marrow signal.  IMPRESSION: 1.  No acute intracranial abnormality. 2. Mild for age nonspecific cerebral white matter signal changes. 3. 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   Electronically Signed   By: Lars Pinks M.D.   On: 10/11/2013 11:33   Dg Chest Portable 1 View  10/10/2013   CLINICAL DATA:  Chest pain and difficulty breathing  EXAM: PORTABLE CHEST - 1 VIEW  COMPARISON:  December 27, 2012  FINDINGS: There is underlying emphysematous change. There is mild scarring in the left base. There is no edema or consolidation. The heart size and pulmonary vascularity are within normal limits. No adenopathy. There is postoperative change in the lower cervical spine region.  IMPRESSION: Underlying emphysema. Mild scarring left base. No edema or consolidation.   Electronically Signed   By: Lowella Grip M.D.   On: 10/10/2013 17:41    Results/Tests Pending at Time of Discharge: none  Discharge Medications:    Medication List    STOP taking these medications       furosemide 20 MG tablet  Commonly known as:  LASIX      TAKE these medications       cephALEXin 500 MG capsule  Commonly known as:  KEFLEX  Take 1 capsule (500 mg total) by mouth every 12 (twelve) hours.     clidinium-chlordiazePOXIDE 5-2.5 MG per capsule  Commonly known as:  LIBRAX  Take 1 capsule by mouth daily.     clopidogrel 75 MG tablet  Commonly known as:  PLAVIX  Take 1 tablet (75 mg total) by mouth daily.     doxepin 100 MG capsule  Commonly known as:  SINEQUAN  Take 100 mg by mouth at bedtime.     esomeprazole 40 MG capsule  Commonly known as:  NEXIUM  Take 40 mg by  mouth daily before breakfast.     estradiol 0.025 MG/24HR  Commonly known as:  VIVELLE-DOT  Place 1 patch onto the skin every other day.     fluticasone 50 MCG/ACT nasal spray  Commonly known as:  FLONASE  Place 2 sprays into both nostrils daily as needed for allergies or rhinitis.     gabapentin 300 MG capsule  Commonly known as:  NEURONTIN  Take 300 mg by mouth at bedtime.     lidocaine 5 %  Commonly known as:  LIDODERM  Place 1 patch onto the skin daily. Remove & Discard patch within 12 hours or as directed by MD     propranolol 10 MG tablet  Commonly known as:  INDERAL  Take 1 tablet (10 mg total) by mouth 2 (two) times daily.     ramipril 5 MG tablet  Commonly known as:  ALTACE  Take 5 mg by mouth daily.     topiramate 100 MG tablet  Commonly known as:  TOPAMAX  Take 1 tablet (100 mg total) by mouth at bedtime.        Discharge Instructions: Please refer to Patient Instructions section of EMR for full details.  Patient was counseled important signs and symptoms that should prompt return to medical care, changes in  medications, dietary instructions, activity restrictions, and follow up appointments.   Follow-Up Appointments:     Follow-up Information   Follow up with Vidal Schwalbe, MD.   Specialty:  Family Medicine   Contact information:   [address], [place] [address] [phone]       Follow up with Vidal Schwalbe, MD. Schedule an appointment as soon as possible for a visit in 1 week. (for hospital follow-up.)    Specialty:  Family Medicine   Contact information:   [address], [place] [address] [place], DO 10/13/2013, 6:25 AM PGY-1, [place]

## 2013-10-14 NOTE — Discharge Summary (Signed)
FMTS ATTENDING  NOTE Treesa Mccully,MD I  have seen and examined this patient, reviewed their chart. I have discussed this patient with the resident. I agree with the resident's findings, assessment and care plan. 

## 2013-10-24 ENCOUNTER — Telehealth: Payer: Self-pay | Admitting: Family Medicine

## 2013-10-24 ENCOUNTER — Other Ambulatory Visit: Payer: Self-pay | Admitting: Neurology

## 2013-10-24 ENCOUNTER — Telehealth: Payer: Self-pay | Admitting: Neurology

## 2013-10-24 NOTE — Telephone Encounter (Signed)
WID:  Spoke to patient and she is still have migraines that seem to be getting worse.  She went to the hospital on 10-10-13 and was inpatient from Monday - Wednesday.  She would like to know if she should come in, her next appointment is 12-13-13.

## 2013-10-24 NOTE — Telephone Encounter (Signed)
Beth called and need some clarification about a patient that was seen in the hospital. The question were about the FMLA papers that were filled out by Dr. Lajuana Ripple and the short term disability papers that were filled out by Dr. Ree Kida. She said that they needed more information to be able to go forward. Please call Beth at [phone] ext [phone]. jw

## 2013-10-24 NOTE — Telephone Encounter (Signed)
Can you please schedule her for a follow up with Megan this week. If her headache appears related to her occipital neuralgia we may consider an occipital nerve block. Thanks.

## 2013-10-24 NOTE — Telephone Encounter (Signed)
Patient complains of terrible headaches and dizzy spells, questioning if she should come in.  Thanks

## 2013-10-24 NOTE — Telephone Encounter (Signed)
Called representative.  No answer.  Left message to page me when ready to discuss patient.  I am unsure of how much more information I can provide, as I am not familiar with her outside of the hospital.  She should consider having these forms filled out by one of the [facility] providers, as she uses them as her PCP.

## 2013-10-25 ENCOUNTER — Encounter: Payer: Self-pay | Admitting: Adult Health

## 2013-10-25 ENCOUNTER — Ambulatory Visit (INDEPENDENT_AMBULATORY_CARE_PROVIDER_SITE_OTHER): Payer: PRIVATE HEALTH INSURANCE | Admitting: Adult Health

## 2013-10-25 ENCOUNTER — Ambulatory Visit (INDEPENDENT_AMBULATORY_CARE_PROVIDER_SITE_OTHER): Payer: PRIVATE HEALTH INSURANCE | Admitting: *Deleted

## 2013-10-25 VITALS — BP 136/89 | HR 77 | Ht 64.0 in | Wt 159.0 lb

## 2013-10-25 VITALS — BP 122/79 | HR 68 | Resp 16

## 2013-10-25 DIAGNOSIS — G43901 Migraine, unspecified, not intractable, with status migrainosus: Secondary | ICD-10-CM

## 2013-10-25 DIAGNOSIS — G43809 Other migraine, not intractable, without status migrainosus: Secondary | ICD-10-CM

## 2013-10-25 MED ORDER — KETOROLAC TROMETHAMINE 30 MG/ML IJ SOLN
30.0000 mg | Freq: Once | INTRAMUSCULAR | Status: AC
  Administered 2013-10-25: 30 mg via INTRAVENOUS

## 2013-10-25 MED ORDER — VALPROATE SODIUM 500 MG/5ML IV SOLN
500.0000 mg | INTRAVENOUS | Status: DC
  Administered 2013-10-25: 500 mg via INTRAVENOUS

## 2013-10-25 NOTE — Progress Notes (Signed)
PATIENT: [name] DOB: 04-25-1950  REASON FOR VISIT: follow up HISTORY FROM: patient  HISTORY OF PRESENT ILLNESS: [name] is a 63 year old female with a history of left occipital neuralgia and migraines. She is currently taking gabapentin, doxepin and Topamax. She returns today complaining of worsening of migraines. She was in the hospital last week for 3 days for headache. At the hospital she was started on propranolol for headache prophylaxis. She feels that her body is "split." She is having pain on the right side of her head. She is having numbness and tingling in the right arm. She is having ringing in the ears. She feels that everyone is echoing. She is having dizziness and describes it as an off balance feeling and at times the room is spinning. She feels that her gait is also affected. She feels that she is walking off to the right. She feels that the right side of her body "is not connected to her." These are the same symptoms she had in the hospital. She states that her symptoms have not gotten worse but no better either.    REVIEW OF SYSTEMS: Full 14 system review of systems performed and notable only for:  Constitutional: N/A  Eyes: Eye pain Ear/Nose/Throat: Ringing in ears  Skin: N/A  Cardiovascular: N/A  Respiratory: N/A  Gastrointestinal: N/A  Genitourinary: N/A Hematology/Lymphatic: N/A  Endocrine: N/A Musculoskeletal: Muscle cramps  Allergy/Immunology: N/A  Neurological: Dizziness, headache, numbness, weakness Psychiatric: Nervous/anxious Sleep: Restless leg, apnea   ALLERGIES: Allergies  Allergen Reactions  . Shellfish Allergy Anaphylaxis and Other (See Comments)    All seafood    HOME MEDICATIONS: Outpatient Prescriptions Prior to Visit  Medication Sig Dispense Refill  . cephALEXin (KEFLEX) 500 MG capsule Take 1 capsule (500 mg total) by mouth every 12 (twelve) hours.  12 capsule  0  . clidinium-chlordiazePOXIDE (LIBRAX) 2.5-5 MG per capsule  Take 1 capsule by mouth daily.       . clopidogrel (PLAVIX) 75 MG tablet Take 1 tablet (75 mg total) by mouth daily.  30 tablet  3  . doxepin (SINEQUAN) 100 MG capsule Take 100 mg by mouth at bedtime.      Marland Kitchen esomeprazole (NEXIUM) 40 MG capsule Take 40 mg by mouth daily before breakfast.      . estradiol (VIVELLE-DOT) 0.025 MG/24HR Place 1 patch onto the skin every other day.       . fluticasone (FLONASE) 50 MCG/ACT nasal spray Place 2 sprays into both nostrils daily as needed for allergies or rhinitis.      Marland Kitchen gabapentin (NEURONTIN) 300 MG capsule Take 300 mg by mouth at bedtime.      . lidocaine (LIDODERM) 5 % Place 1 patch onto the skin daily. Remove & Discard patch within 12 hours or as directed by MD      . propranolol (INDERAL) 10 MG tablet Take 1 tablet (10 mg total) by mouth 2 (two) times daily.  60 tablet  3  . ramipril (ALTACE) 5 MG tablet Take 5 mg by mouth daily.      Marland Kitchen topiramate (TOPAMAX) 100 MG tablet Take 1 tablet (100 mg total) by mouth at bedtime.  90 tablet  2   No facility-administered medications prior to visit.    PAST MEDICAL HISTORY: Past Medical History  Diagnosis Date  . Hyperlipidemia   . Chronic kidney disease   . Migraine headache   . GERD (gastroesophageal reflux disease)   . Occipital neuralgia  Left    PAST SURGICAL HISTORY: Past Surgical History  Procedure Laterality Date  . Abdominal hysterectomy    . Knee surgery    . Carpal tunnel release    . Elbow surgery    . Lung surgery    . Bladder repair    . Cervical spine surgery      FAMILY HISTORY: Family History  Problem Relation Age of Onset  . Cancer Mother 29    breast  . Alzheimer's disease Mother   . Cancer Brother   . Hyperlipidemia Brother     SOCIAL HISTORY: History   Social History  . Marital Status: Married    Spouse Name: [name]    Number of Children: 1  . Years of Education: 12+   Occupational History  . ACTIVITY DIRECTOR   . CNA    Social History Main Topics  .  Smoking status: Former Smoker -- 0.30 packs/day    Types: Cigarettes    Quit date: 10/31/2012  . Smokeless tobacco: Never Used  . Alcohol Use: No  . Drug Use: No  . Sexual Activity: Yes    Partners: Male    Birth Control/ Protection: Surgical     Comment: 1 sexual partner in last 12 months: married   Other Topics Concern  . Not on file   Social History Narrative   Patient is married ([name]).   Patient has one child.   Patient is working full-time.   Patient is right handed.   Patient has a college education.   Patient drinks 6-8 cups of coffee daily.Marland Kitchen      PHYSICAL EXAM  There were no vitals filed for this visit. There is no weight on file to calculate BMI.  Generalized: Well developed, in no acute distress   Neurological examination  Mentation: Alert oriented to time, place, history taking. Follows all commands speech and language fluent Cranial nerve II-XII: Pupils were equal round reactive to light. Extraocular movements were full, visual field were full on confrontational test. Facial sensation and strength were normal. hearing was intact to finger rubbing bilaterally. Head turning and shoulder shrug  were normal and symmetric. Motor: The motor testing reveals 5 over 5 strength on the left side and 4/5 on the right side. Decrease grip strength noted on the right. Good symmetric motor tone is noted throughout.  Sensory: Sensory testing is intact to soft touch on all 4 extremities. No evidence of extinction is noted.  Coordination: Cerebellar testing reveals good finger-nose-finger and heel-to-shin bilaterally however slower on the right.  Gait and station: Gait is normal. Tandem gait is normal. Romberg is negative. No drift is seen.  Reflexes: Deep tendon reflexes are symmetric and normal bilaterally.    DIAGNOSTIC DATA (LABS, IMAGING, TESTING) - I reviewed patient records, labs, notes, testing and imaging myself where available.  Lab Results  Component Value Date    WBC 7.4 10/10/2013   HGB 14.3 10/10/2013   HCT 42.0 10/10/2013   MCV 87.5 10/10/2013   PLT 232 10/10/2013      Component Value Date/Time   NA 145 10/12/2013 0512   K 4.4 10/12/2013 0512   CL 110 10/12/2013 0512   CO2 23 10/12/2013 0512   GLUCOSE 90 10/12/2013 0512   BUN 18 10/12/2013 0512   CREATININE 1.22* 10/12/2013 0512   CALCIUM 8.6 10/12/2013 0512   PROT 7.1 10/10/2013 1521   ALBUMIN 4.0 10/10/2013 1521   AST 17 10/10/2013 1521   ALT 20 10/10/2013 1521   ALKPHOS  108 10/10/2013 1521   BILITOT 0.2* 10/10/2013 1521   GFRNONAA 47* 10/12/2013 0512   GFRAA 54* 10/12/2013 0512   Lab Results  Component Value Date   CHOL 204* 10/11/2013   HDL 55 10/11/2013   LDLCALC 122* 10/11/2013   TRIG 133 10/11/2013   CHOLHDL 3.7 10/11/2013   Lab Results  Component Value Date   HGBA1C 5.9* 10/10/2013   No results found for this basename: VITAMINB12   No results found for this basename: TSH      ASSESSMENT AND PLAN 63 y.o. year old female  has a past medical history of Hyperlipidemia; Chronic kidney disease; Migraine headache; GERD (gastroesophageal reflux disease); and Occipital neuralgia. here with;  1. migraines   Patient returns today complaining of headache. She was recently admitted in the hospital for the same complaints. There they did an MRI that ruled out a stroke. They started the patient on propranolol. She is currently taken gabapentin, doxepin and Topamax. I have consulted with Dr. Janann Colonel. He is also came into the room and examine the patient. At this time we feel that her symptoms are related to a complex migraine. I will have the patient set up for 500 mg Depacon infusion today in the office. I will only do 500 mg at this time due to her kidney function. She'll also get a Toradol injection. Patient verbalized understanding and is in agreement with her plan.  Addendum: Patient rated her headache as a 10 on a 10 before the Depacon infusion and Toradol injection. After the infusion the patient rated her pain as a 6/10 and  some of her right-sided symptoms were improving. I have advised the patient to go home and rest today. I have advised her to avoid stimulation and things like the television, smart phone and/or tablets. Patient verbalized understanding she should return our office in  3 months or sooner if needed.      Ward Givens, MSN, NP-C 10/25/2013, 10:10 AM [facility] [address] [place], [place] [address] [phone]  Note: This document was prepared with digital dictation and possible smart phrase technology. Any transcriptional errors that result from this process are unintentional.

## 2013-10-25 NOTE — Patient Instructions (Addendum)
Valproic Acid, Divalproex Sodium injection What is this medicine? VALPROATE SODIUM (val PRO ate SO dee um) is used to treat certain types of seizures in patients with epilepsy. This medicine may be used for other purposes; ask your health care provider or pharmacist if you have questions. COMMON BRAND NAME(S): Depacon What should I tell my health care provider before I take this medicine? They need to know if you have any of these conditions: -blood disease -brain damage or disease -kidney disease -liver disease -low blood proteins -mitochondrial disease -suicidal thoughts, plans, or attempt; a previous suicide attempt by you or a family member -urea cycle disorder (UCD) -an unusual or allergic reaction to divalproex sodium, other medicines, foods, dyes, or preservatives -pregnant or trying to get pregnant -breast-feeding How should I use this medicine? This medicine is for infusion into a vein. It is given by a health care professional in a hospital or clinic setting. Talk to your pediatrician regarding the use of this medicine in children. Special care may be needed. Overdosage: If you think you have taken too much of this medicine contact a poison control center or emergency room at once. NOTE: This medicine is only for you. Do not share this medicine with others. What if I miss a dose? This does not apply. What may interact with this medicine? -aspirin -barbiturates, like phenobarbital -diazepam -isoniazid -medicines for depression, anxiety, or psychotic disturbances -medicines that treat or prevent blood clots like warfarin -meropenem -other seizure medicines -rifampin -tolbutamide -zidovudine This list may not describe all possible interactions. Give your health care provider a list of all the medicines, herbs, non-prescription drugs, or dietary supplements you use. Also tell them if you smoke, drink alcohol, or use illegal drugs. Some items may interact with your  medicine. What should I watch for while using this medicine? Your condition will be monitored carefully while you are receiving this medicine. You may get drowsy, dizzy, or have blurred vision. Do not drive, use machinery, or do anything that needs mental alertness until you know how this medicine affects you. To reduce dizzy or fainting spells, do not sit or stand up quickly, especially if you are an older patient. Alcohol can increase drowsiness and dizziness. Avoid alcoholic drinks. This medicine can cause blood problems. This can mean slow healing and a risk of infection. Problems can arise if you need dental work, and in the day to day care of your teeth. Try to avoid damage to your teeth and gums when you brush or floss your teeth. This medicine can make you more sensitive to the sun. Keep out of the sun. If you cannot avoid being in the sun, wear protective clothing and use sunscreen. Do not use sun lamps or tanning beds/booths. The use of this medicine may increase the chance of suicidal thoughts or actions. Pay special attention to how you are responding while on this medicine. Any worsening of mood, or thoughts of suicide or dying should be reported to your health care professional right away. What side effects may I notice from receiving this medicine? Side effects that you should report to your doctor or health care professional as soon as possible: -allergic reactions like skin rash, itching or hives, swelling of the face, lips, or tongue -changes in vision -changes in the frequency or severity of seizures -nausea, vomiting -redness, blistering, peeling or loosening of the skin, including inside the mouth -stomach pain or cramps -trembling of hands or arms -unusual bleeding or bruising or pinpoint red spots  on the skin -unusual swelling of the arms or legs -unusually weak or tired -worsening of mood, thoughts or actions of suicide or dying -yellowing of the eyes or skin Side effects  that usually do not require medical attention (report to your doctor or health care professional if they continue or are bothersome): -change in menstrual cycle -diarrhea or constipation -headache -loss of bladder control -loss of hair or unusual growth of hair -loss or increase in appetite -weight gain or loss This list may not describe all possible side effects. Call your doctor for medical advice about side effects. You may report side effects to FDA at 1-800-FDA-1088. Where should I keep my medicine? This drug is given in a hospital or clinic and will not be stored at home. NOTE: This sheet is a summary. It may not cover all possible information. If you have questions about this medicine, talk to your doctor, pharmacist, or health care provider.  2015, Elsevier/Gold Standard. (2011-10-21 11:53:19) And

## 2013-10-25 NOTE — Patient Instructions (Signed)
To call back if continued headache.

## 2013-10-25 NOTE — Telephone Encounter (Signed)
I also attempted to call Beth. No answer. Left voicemail.

## 2013-10-25 NOTE — Progress Notes (Signed)
Pt here for appt.  Order for Depacon 500mg  IV for Level 10 R sided headache.  Associated sx tingling bilateral arms and legs, dizziness.  No nausea.  Pt taken to treatment room form room 12.  Made comfortable in recliner.  R arm under pillow.  Under aseptic technique 24g angiocath inserted to R mid AC, with good blood return.  Allergies to shellfish.   Depacon 500mg  IV/ 0.9% NS 164ml started at 1051. Lights dimmed.   Declined water.   At 1103 - taking the edge off.  1105 145ml 0.9%NS flush started.  Level 8 at 1112.  Toradol 30mg  IVP at 1120.  0.9 % NS flush at 1127, level 7.  At 1136 level 6.  Pt resting in dark, quiet room.  VS 122/79 HR 68 RR 16, 97% 02 sat.  IV discontinued.  (approx 70ml left in NS bag).  Pressure and bandage applied.  Ward Givens NP seeing pt.  Pt to go home and rest in quiet, dark place.  No TV.  To call us one to two days if still with headache.  Level 6, tingling R side better.   Pt verbalized understanding.  Accompanied to check out and to husband.

## 2013-10-25 NOTE — Telephone Encounter (Signed)
Patient was notified and an appointment was scheduled with Megan for 08/18 at 10:00 am.

## 2013-10-26 ENCOUNTER — Telehealth: Payer: Self-pay | Admitting: Adult Health

## 2013-10-26 DIAGNOSIS — G43901 Migraine, unspecified, not intractable, with status migrainosus: Secondary | ICD-10-CM | POA: Insufficient documentation

## 2013-10-26 MED ORDER — PREDNISONE 5 MG PO TABS: ORAL_TABLET | ORAL | Status: DC

## 2013-10-26 NOTE — Telephone Encounter (Signed)
Patient calling to update how she's doing since the nerve block, states that it has brought the pain level down to a 4 but she still feels like she's got a big rubber band around her head squeezing her head. Please return call to patient and advise.

## 2013-10-26 NOTE — Telephone Encounter (Signed)
Patient's headache did improve. She now rates her pain 4/10. I consulted with Dr. Jannifer Franklin and I will give her a prednisone dosepak for 6 days. If her headache has not improved in 6 days she should let us know. She verbalizes understanding.

## 2013-10-31 NOTE — Telephone Encounter (Signed)
Spoke with [employer]. Clarified that patient did Not have a CVA. 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 She was told to follow up with Dr. Harlan Stains. I have given Beth the office number for Dr. Dema Severin.

## 2013-11-01 ENCOUNTER — Telehealth: Payer: Self-pay | Admitting: Family Medicine

## 2013-11-01 NOTE — Telephone Encounter (Signed)
I cared for [name] while hospitalized in July 2015. She stopped by our office today asking for a letter to be sent to her employer and insurance company stating that she can not work. I informed her that I can not complete this paperwork as I am not her PCP and have not followed her since her discharge. She does not currently have a PCP (Harlan Stains is listed as her PCP however she has never established with her per the patient). I offered for her to establish care at our office so that I can complete the letter/paperwork that she requires. She agreed to this plan. Will have front office staff call patient to set up new patient appointment.

## 2013-11-02 ENCOUNTER — Telehealth: Payer: Self-pay | Admitting: Nurse Practitioner

## 2013-11-02 NOTE — Telephone Encounter (Signed)
Patient stated she really needed to be seen due to recently hospitalized.  Still having problems and headaches aren't getting better.  Next available appt offered was 12/7.  Please return anytime and leave message on voice mail.

## 2013-11-02 NOTE — Telephone Encounter (Signed)
Spoke with patient, appointment has been scheduled for 11/03/13 at 9 am with NP MM

## 2013-11-02 NOTE — Telephone Encounter (Signed)
Jinny Blossom, you seen this patient last please advise previous message.

## 2013-11-02 NOTE — Telephone Encounter (Signed)
She if she can come in tomorrow morning.

## 2013-11-03 ENCOUNTER — Ambulatory Visit (INDEPENDENT_AMBULATORY_CARE_PROVIDER_SITE_OTHER): Payer: PRIVATE HEALTH INSURANCE | Admitting: Adult Health

## 2013-11-03 ENCOUNTER — Telehealth: Payer: Self-pay | Admitting: *Deleted

## 2013-11-03 ENCOUNTER — Ambulatory Visit (INDEPENDENT_AMBULATORY_CARE_PROVIDER_SITE_OTHER): Payer: PRIVATE HEALTH INSURANCE | Admitting: Neurology

## 2013-11-03 ENCOUNTER — Encounter: Payer: Self-pay | Admitting: Adult Health

## 2013-11-03 ENCOUNTER — Telehealth: Payer: Self-pay | Admitting: Adult Health

## 2013-11-03 VITALS — BP 139/85 | HR 74 | Ht 64.0 in | Wt 156.0 lb

## 2013-11-03 DIAGNOSIS — G43901 Migraine, unspecified, not intractable, with status migrainosus: Secondary | ICD-10-CM

## 2013-11-03 DIAGNOSIS — F4323 Adjustment disorder with mixed anxiety and depressed mood: Secondary | ICD-10-CM

## 2013-11-03 MED ORDER — SERTRALINE HCL 25 MG PO TABS: 25.0000 mg | ORAL_TABLET | Freq: Every day | ORAL | Status: DC

## 2013-11-03 MED ORDER — VALPROATE SODIUM 500 MG/5ML IV SOLN
500.0000 mg | INTRAVENOUS | Status: DC
  Administered 2013-11-03: 500 mg via INTRAVENOUS

## 2013-11-03 NOTE — Progress Notes (Signed)
PATIENT: [name] DOB: 20-Dec-1950  REASON FOR VISIT: follow up HISTORY FROM: patient  HISTORY OF PRESENT ILLNESS: [name] is a 63 year old female with a history of migraines. Patient returns today stating that her headache has not improved. She states that it is located on the right side of her head. She rates her pain 4 /10. She has sensory changes on the right side. She also feels weaker on the right side. When I saw this patient a week ago she had been admitted to the hospital and stroke was ruled out. She was given a Depacon infusion in the office that brought her pain level down to a 4/10 and it has remained that way since the visit. She has stage 3 CKD. Patient husband feels that she is depressed. She agrees. States that since her dad passed she has been having a " hard time." She would like to consider anti-depressant medication. She states that the headache doesn't help.   HISTORY 10/25/13: Patient returns today complaining of headache. She was recently admitted in the hospital for the same complaints. There they did an MRI that ruled out a stroke. They started the patient on propranolol. She is currently taken gabapentin, doxepin and Topamax. I have consulted with Dr. Janann Colonel. He is also came into the room and examine the patient. At this time we feel that her symptoms are related to a complex migraine. I will have the patient set up for 500 mg Depacon infusion today in the office. I will only do 500 mg at this time due to her kidney function. She'll also get a Toradol injection. Patient verbalized understanding and is in agreement with her plan.   REVIEW OF SYSTEMS: Full 14 system review of systems performed and notable only for:  Constitutional: appetite change, fatigue Eyes: N/A Ear/Nose/Throat: ringing in ears Skin: N/A  Cardiovascular: N/A  Respiratory: N/A  Gastrointestinal: N/A  Genitourinary: N/A Hematology/Lymphatic: N/A  Endocrine: N/A Musculoskeletal: aching  muscles, muscle cramps  Allergy/Immunology: N/A  Neurological: dizziness, headache, numbness, speech difficulty Psychiatric: confusion, nervous/anxious Sleep: restless leg, frequent waking   ALLERGIES: Allergies  Allergen Reactions  . Shellfish Allergy Anaphylaxis and Other (See Comments)    All seafood    HOME MEDICATIONS: Outpatient Prescriptions Prior to Visit  Medication Sig Dispense Refill  . clidinium-chlordiazePOXIDE (LIBRAX) 2.5-5 MG per capsule Take 1 capsule by mouth daily.       . clopidogrel (PLAVIX) 75 MG tablet Take 1 tablet (75 mg total) by mouth daily.  30 tablet  3  . doxepin (SINEQUAN) 100 MG capsule Take 100 mg by mouth at bedtime.      Marland Kitchen esomeprazole (NEXIUM) 40 MG capsule Take 40 mg by mouth daily before breakfast.      . fluticasone (FLONASE) 50 MCG/ACT nasal spray Place 2 sprays into both nostrils daily as needed for allergies or rhinitis.      Marland Kitchen gabapentin (NEURONTIN) 300 MG capsule Take 300 mg by mouth at bedtime.      . lidocaine (LIDODERM) 5 % Place 1 patch onto the skin daily. Remove & Discard patch within 12 hours or as directed by MD      . propranolol (INDERAL) 10 MG tablet Take 1 tablet (10 mg total) by mouth 2 (two) times daily.  60 tablet  3  . ramipril (ALTACE) 5 MG tablet Take 5 mg by mouth daily.      Marland Kitchen topiramate (TOPAMAX) 100 MG tablet Take 1 tablet (100 mg total)  by mouth at bedtime.  90 tablet  2  . predniSONE (DELTASONE) 5 MG tablet Begin taking 6 tablets daily, taper by one tablet daily until off the medication.  21 tablet  0   Facility-Administered Medications Prior to Visit  Medication Dose Route Frequency Provider Last Rate Last Dose  . valproate (DEPACON) 500 mg in sodium chloride 0.9 % 100 mL IVPB  500 mg Intravenous Continuous Ward Givens, NP   500 mg at 10/25/13 1051    PAST MEDICAL HISTORY: Past Medical History  Diagnosis Date  . Hyperlipidemia   . Chronic kidney disease   . Migraine headache   . GERD (gastroesophageal  reflux disease)   . Occipital neuralgia     Left    PAST SURGICAL HISTORY: Past Surgical History  Procedure Laterality Date  . Abdominal hysterectomy    . Knee surgery    . Carpal tunnel release    . Elbow surgery    . Lung surgery    . Bladder repair    . Cervical spine surgery      FAMILY HISTORY: Family History  Problem Relation Age of Onset  . Cancer Mother 40    breast  . Alzheimer's disease Mother   . Cancer Brother   . Hyperlipidemia Brother     SOCIAL HISTORY: History   Social History  . Marital Status: Married    Spouse Name: [name]    Number of Children: 1  . Years of Education: 12+   Occupational History  . ACTIVITY DIRECTOR   . CNA    Social History Main Topics  . Smoking status: Former Smoker -- 0.30 packs/day    Types: Cigarettes    Quit date: 10/31/2012  . Smokeless tobacco: Never Used  . Alcohol Use: No  . Drug Use: No  . Sexual Activity: Yes    Partners: Male    Birth Control/ Protection: Surgical     Comment: 1 sexual partner in last 12 months: married   Other Topics Concern  . Not on file   Social History Narrative   Patient is married ([name]).   Patient has one child.   Patient is working full-time.   Patient is right handed.   Patient has a college education.   Patient drinks 6-8 cups of coffee daily.Marland Kitchen      PHYSICAL EXAM  Filed Vitals:   11/03/13 0856  BP: 139/85  Pulse: 74  Height: 5\' 4"  (1.626 m)  Weight: 156 lb (70.761 kg)   Body mass index is 26.76 kg/(m^2).  Generalized: Well developed, in no acute distress   Neurological examination  Mentation: Alert oriented to time, place, history taking. Follows all commands speech and language fluent Cranial nerve II-XII: Pupils were equal round reactive to light. Extraocular movements were full, visual field were full on confrontational test. Facial sensation decreased on the right and strength were normal.  Uvula tongue midline. Head turning and shoulder shrug  were normal  and symmetric Motor: The motor testing reveals 5 over 5 strength in the left upper and lower extremity. 4/5 strength in the right upper and lower extremity. Good symmetric motor tone is noted throughout.  Sensory: Sensory testing is intact to soft touch on all 4 extremities.  No evidence of extinction is noted.  Coordination: Cerebellar testing reveals good finger-nose-finger and heel-to-shin bilaterally.  Gait and station: Gait is normal. Tandem gait is normal. Romberg is negative. No drift is seen.  Reflexes: Deep tendon reflexes are symmetric and normal bilaterally.  DIAGNOSTIC DATA (LABS, IMAGING, TESTING) - I reviewed patient records, labs, notes, testing and imaging myself where available.  Lab Results  Component Value Date   WBC 7.4 10/10/2013   HGB 14.3 10/10/2013   HCT 42.0 10/10/2013   MCV 87.5 10/10/2013   PLT 232 10/10/2013      Component Value Date/Time   NA 145 10/12/2013 0512   K 4.4 10/12/2013 0512   CL 110 10/12/2013 0512   CO2 23 10/12/2013 0512   GLUCOSE 90 10/12/2013 0512   BUN 18 10/12/2013 0512   CREATININE 1.22* 10/12/2013 0512   CALCIUM 8.6 10/12/2013 0512   PROT 7.1 10/10/2013 1521   ALBUMIN 4.0 10/10/2013 1521   AST 17 10/10/2013 1521   ALT 20 10/10/2013 1521   ALKPHOS 108 10/10/2013 1521   BILITOT 0.2* 10/10/2013 1521   GFRNONAA 47* 10/12/2013 0512   GFRAA 54* 10/12/2013 0512   Lab Results  Component Value Date   CHOL 204* 10/11/2013   HDL 55 10/11/2013   LDLCALC 122* 10/11/2013   TRIG 133 10/11/2013   CHOLHDL 3.7 10/11/2013   Lab Results  Component Value Date   HGBA1C 5.9* 10/10/2013    ASSESSMENT AND PLAN 63 y.o. year old female  has a past medical history of Hyperlipidemia; Chronic kidney disease; Migraine headache; GERD (gastroesophageal reflux disease); and Occipital neuralgia. here with:  1. Complex migraine  Patient continues to have headaches. She states the severity decreased after the first treatment with Depacon. I have consulted with Dr. Jannifer Franklin and we will do another  infusion of Depacon 500 mg today in the office. Patient also expressed that she felt depressed and was very tearful while talking about this. She also states that she does not sleep well at night. She would like to try an antidepressant. I will start Zoloft 25 mg daily. Patient should let us know if her symptoms worsen or if she develops new symptoms. Patient should follow-up in 2 months or sooner if needed.    Ward Givens, MSN, NP-C 11/03/2013, 8:59 AM [facility] [address], [place], [place] [address] [phone]  Note: This document was prepared with digital dictation and possible smart phrase technology. Any transcriptional errors that result from this process are unintentional.

## 2013-11-03 NOTE — Patient Instructions (Signed)

## 2013-11-03 NOTE — Telephone Encounter (Signed)
The patient would like me to write a letter stating what she is being treated for and that her migraines can affect her ability to work. I have written the letter and also read it back to the patient. She agrees with what I have written. I will fax it today. I have also encourage the patient to have FMLA paperwork completed if she is concerned about losing her job. She states that she is seeing her medical doctor tomorrow and he has started filling it out.

## 2013-11-03 NOTE — Telephone Encounter (Signed)
As requested by patient, note for work was faxed to [employer], please see phone note dated for 11/03/13.

## 2013-11-03 NOTE — Progress Notes (Signed)
Patient here seeing [name].  Order for Depacon 500mg  IV.  Patient to treatment room with her spouse.  Headache pain level about 5, has been persistent since last visit.  Under aseptic technique IV was started in right AC, good blood return, 24g angiocath.  Depacon 500mg /100cc 0.9%NS started at 0942.  There was no change when infusion was completed, headache remained at level 5.  IV was then flushed with 100cc 0.9%NS, upon completion patient still had no change.  NP came in to speak to patient during the visit and then at the end of infusion.  IV was discontinued and removed.  Patient to check out with husband.

## 2013-11-03 NOTE — Telephone Encounter (Signed)
Megan---please advise.  

## 2013-11-03 NOTE — Telephone Encounter (Signed)
Patient calling Courtney Blossom, NP back with fax number to employer for work note.  Fax # [phone], attention [employer].  Please call anytime with questions.

## 2013-11-03 NOTE — Patient Instructions (Signed)
Patient was to return home with spouse and rest.

## 2013-11-03 NOTE — Progress Notes (Signed)
I have read the note, and I agree with the clinical assessment and plan.  Eleny Cortez KEITH   

## 2013-11-04 ENCOUNTER — Encounter: Payer: Self-pay | Admitting: Adult Health

## 2013-11-04 ENCOUNTER — Telehealth: Payer: Self-pay | Admitting: Adult Health

## 2013-11-04 ENCOUNTER — Telehealth: Payer: Self-pay | Admitting: Neurology

## 2013-11-04 MED ORDER — TIZANIDINE HCL 2 MG PO TABS: ORAL_TABLET | ORAL | Status: DC

## 2013-11-04 NOTE — Telephone Encounter (Signed)
Patient continues to have a daily headache. The Depacon infusion was not beneficial. After consulting with Dr. Jannifer Franklin, we will start the patient on tizanidine 2 mg twice a day for one week if the patient can tolerate this medication she will then increase to 2 mg 3 times a day. The patient would also like [place] to re-fax the letter I sent yesterday to another number. She would also like the last office note to be faxed to this number as well. [phone]

## 2013-11-04 NOTE — Telephone Encounter (Signed)
I called the patient. She needs to sign a release form in order for [place] to fax the last office note to your insurance/work. She verbalized understanding and will try to come today.

## 2013-11-04 NOTE — Telephone Encounter (Signed)
Spoke to patient and she wanted to relay to [facility] that the infusion did not work yesterday.  She is still at the same pain level, 5, and she also stated that during the night she woke and had diarrhea.

## 2013-11-09 ENCOUNTER — Encounter: Payer: Self-pay | Admitting: Family Medicine

## 2013-11-09 ENCOUNTER — Ambulatory Visit (INDEPENDENT_AMBULATORY_CARE_PROVIDER_SITE_OTHER): Payer: PRIVATE HEALTH INSURANCE | Admitting: Family Medicine

## 2013-11-09 VITALS — BP 115/73 | HR 59 | Temp 98.3°F | Wt 157.4 lb

## 2013-11-09 DIAGNOSIS — R42 Dizziness and giddiness: Secondary | ICD-10-CM | POA: Insufficient documentation

## 2013-11-09 DIAGNOSIS — G43809 Other migraine, not intractable, without status migrainosus: Secondary | ICD-10-CM

## 2013-11-09 LAB — CBC
HCT: 42.8 % (ref 36.0–46.0)
Hemoglobin: 14.5 g/dL (ref 12.0–15.0)
MCH: 29.4 pg (ref 26.0–34.0)
MCHC: 33.9 g/dL (ref 30.0–36.0)
MCV: 86.8 fL (ref 78.0–100.0)
Platelets: 250 10*3/uL (ref 150–400)
RBC: 4.93 MIL/uL (ref 3.87–5.11)
RDW: 14.6 % (ref 11.5–15.5)
WBC: 8.4 10*3/uL (ref 4.0–10.5)

## 2013-11-09 LAB — BASIC METABOLIC PANEL WITH GFR
BUN: 11 mg/dL (ref 6–23)
CO2: 24 mEq/L (ref 19–32)
Calcium: 9.1 mg/dL (ref 8.4–10.5)
Chloride: 102 mEq/L (ref 96–112)
Creat: 1.32 mg/dL — ABNORMAL HIGH (ref 0.50–1.10)
GFR, Est African American: 50 mL/min — ABNORMAL LOW
GFR, Est Non African American: 43 mL/min — ABNORMAL LOW
Glucose, Bld: 95 mg/dL (ref 70–99)
Potassium: 3.7 mEq/L (ref 3.5–5.3)
Sodium: 139 mEq/L (ref 135–145)

## 2013-11-09 NOTE — Progress Notes (Signed)
Patient ID: [name], female   DOB: Jun 20, 1950, 63 y.o.   MRN: [id number]  Georges Lynch, MD, MS Phone: [phone]  Subjective:  Chief complaint: Patient establish visit  Pt Here for patient establish visit and hospital followup Patient is here for a followup visit after being admitted to the hospital. She was not previously a patient in this clinic and is establishing care with [place] now. Patient was admitted on August 3 of this year for a debilitating migraine with aura causing right-sided body weakness. She is currently seen by neurology for her care and treatment of these migraines. She is also seen by nephrology for the care of her chronic kidney disease.  Today we discussed issues she's been having with dizziness and balance issues that if accompanied her frequent migraines. She states that the migraines are constant and are primarily localized to the posterior right parietal region of her head. She states that her current situation has been significantly affecting her daily life and she is growing increasingly more frustrated with the fact that she is unable to go to work. Her husband states that she has been "out of it" since the onset of these most recent migraines. He states that she is very forgetful now which is new and she sometimes does not respond to questions she is asked. No such symptoms were noticed during our visit however it was noted she had a very flat affect. She became tearful when I asked husband to leave the room and stated her frustrations with her current situation. There was no mention of any issues with her current marriage or problems at home.   Review of Systems  Constitutional: Positive for chills and malaise/fatigue. Negative for fever and diaphoresis.  Eyes: Negative for blurred vision and double vision.  Respiratory: Negative for cough and shortness of breath.   Cardiovascular: Negative for chest pain.  Gastrointestinal: Negative for nausea, vomiting and  diarrhea.  Musculoskeletal: Positive for neck pain.  Neurological: Positive for dizziness, speech change and headaches.  Psychiatric/Behavioral: Positive for depression and memory loss. Negative for suicidal ideas and substance abuse. The patient is not nervous/anxious.      Past Medical History Patient Active Problem List   Diagnosis Date Noted  . Dizziness 11/09/2013  . Adjustment disorder with mixed anxiety and depressed mood 11/03/2013  . Migraine with status migrainosus 10/26/2013  . Migraine variant 10/10/2013  . Disturbance of skin sensation 10/10/2013  . UTI (lower urinary tract infection) 10/10/2013  . TIA (transient ischemic attack) 10/10/2013  . HTN (hypertension) 07/18/2013  . 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 Neuralgia, neuritis, and radiculitis, unspecified 02/04/2012  . Migraine without aura, with intractable migraine, so stated, without mention of status migrainosus 02/04/2012  . 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 Myofascial muscle pain 07/15/2011    Medications- reviewed and updated Current Outpatient Prescriptions  Medication Sig Dispense Refill  . clidinium-chlordiazePOXIDE (LIBRAX) 2.5-5 MG per capsule Take 1 capsule by mouth daily.       . clopidogrel (PLAVIX) 75 MG tablet Take 1 tablet (75 mg total) by mouth daily.  30 tablet  3  . doxepin (SINEQUAN) 100 MG capsule Take 100 mg by mouth at bedtime.      Marland Kitchen esomeprazole (NEXIUM) 40 MG capsule Take 40 mg by mouth daily before breakfast.      . fluticasone (FLONASE) 50 MCG/ACT nasal spray Place 2 sprays into both nostrils daily as needed for allergies or rhinitis.      Marland Kitchen gabapentin (NEURONTIN) 300 MG  capsule Take 300 mg by mouth at bedtime.      . lidocaine (LIDODERM) 5 % Place 1 patch onto the skin daily. Remove & Discard patch within 12 hours or as directed by MD      . propranolol (INDERAL) 10 MG tablet Take 1 tablet (10 mg total) by mouth 2 (two) times daily.  60 tablet  3  . ramipril (ALTACE) 5 MG  tablet Take 5 mg by mouth daily.      . sertraline (ZOLOFT) 25 MG tablet Take 1 tablet (25 mg total) by mouth daily.  30 tablet  3  . tiZANidine (ZANAFLEX) 2 MG tablet Take 1 tablet by mouth twice a day for one week, then increase to three times a day thereafter.  90 tablet  3  . topiramate (TOPAMAX) 100 MG tablet Take 1 tablet (100 mg total) by mouth at bedtime.  90 tablet  2   Current Facility-Administered Medications  Medication Dose Route Frequency Provider Last Rate Last Dose  . valproate (DEPACON) 500 mg in sodium chloride 0.9 % 100 mL IVPB  500 mg Intravenous Continuous Ward Givens, NP   500 mg at 10/25/13 1051  . valproate (DEPACON) 500 mg in sodium chloride 0.9 % 100 mL IVPB  500 mg Intravenous Continuous Ward Givens, NP   500 mg at 11/03/13 0942    Objective: BP 115/73  Pulse 59  Temp(Src) 98.3 F (36.8 C) (Oral)  Wt 157 lb 6.4 oz (71.396 kg) Orthostatic blood pressure: 95/59 (Laying), 106/72 (seated), 115/73 (standing)  Gen: NAD, alert, cooperative with exam, flat affect HEENT: NCAT, EOMI, PERRL CV: RRR, good S1/S2, no murmur Resp: CTABL, no wheezes, non-labored Abd: Soft, Non Tender, Non Distended, BS present Ext: No edema, warm. Noted weakness on Rt side both UE and LE, positive pronator drift on right side. Neuro: Alert and oriented, No gross deficits noted at this time.   Assessment/Plan:  Migraine variant Patient is currently being followed by neurology for her treatment and care of migraines. I will continue to monitor this issue and will consult her neurologist if questions arise.  Dizziness Orthostatic blood pressures were obtained (see vitals section above.). She is notably hypotensive while laying down. At this time I do not believe this has any significant finding. However I will continue to monitor and we'll plan to retake his orthostatic measurements at our next visit. Her unilateral weakness and positive pronator drift on her right side are  significantly troubling to me. I believe that there could be a correlation with her migraine however I am currently unsure. If there is no correlation then this could be medication related. Specific medications she is on can cause dizziness/lightheadedness including Librax, doxepin, propanolol. She is been taking Librax and doxepin for over a decade now however she was recently placed on propanolol for her migraine headaches very recently (after the onset of these symptoms.) Gabapentin can also cause sedation. - At our next visit I plan on getting deeper into some of these balance issues. - I will strongly consider removing some of these medications from her medication regimen. - I've ordered a BMP and CBC to be taken here in the office I will followup on these labs.     Orders Placed This Encounter  Procedures  . BASIC METABOLIC PANEL WITH GFR  . CBC  . Orthostatic vital signs    No orders of the defined types were placed in this encounter.

## 2013-11-09 NOTE — Telephone Encounter (Signed)
Called patient back, no answer left voice message to return the call. Per Hilda Blades in medical records patient has not yet signed the release form, this will need to be done before documents can be sent.

## 2013-11-09 NOTE — Assessment & Plan Note (Signed)
Patient is currently being followed by neurology for her treatment and care of migraines. I will continue to monitor this issue and will consult her neurologist if questions arise.

## 2013-11-09 NOTE — Patient Instructions (Addendum)
It was a pleasure seeing you today in our clinic. Today we discussed your history of migraines and lightheadedness/dizziness. Here is the treatment plan we have discussed and agreed upon together:  - Continue current medication regimen. - Continue to followup with neurology for migraine headaches, and nephrology for treatment of kidney disease. - Today we have obtained her orthostatic blood pressures, BMP, CBC. I will contact you with the results of these tests. - I would like you to schedule followup appointment with me in approximately one month. At that time you and I will go over your symptoms at that time as well as possible treatment options for any continued issues he may be experiencing. - Please do not hesitate to contact me if your symptoms worsen in any way.

## 2013-11-09 NOTE — Assessment & Plan Note (Addendum)
Orthostatic blood pressures were obtained (see vitals section above.). She is notably hypotensive while laying down. At this time I do not believe this has any significant finding. However I will continue to monitor and we'll plan to retake his orthostatic measurements at our next visit. Her unilateral weakness and positive pronator drift on her right side are significantly troubling to me. I believe that there could be a correlation with her migraine however I am currently unsure. If there is no correlation then this could be medication related. Specific medications she is on can cause dizziness/lightheadedness including Librax, doxepin, propanolol. She is been taking Librax and doxepin for over a decade now however she was recently placed on propanolol for her migraine headaches very recently (after the onset of these symptoms.) Gabapentin can also cause sedation. - At our next visit I plan on getting deeper into some of these balance issues. - I will strongly consider removing some of these medications from her medication regimen. - I've ordered a BMP and CBC to be taken here in the office I will followup on these labs.

## 2013-11-09 NOTE — Telephone Encounter (Signed)
Pt called and stated she did sign release form.  Attn: [place] [phone] with ofv note and letter.  Please fax.  Thanks.

## 2013-11-10 ENCOUNTER — Telehealth: Payer: Self-pay | Admitting: *Deleted

## 2013-11-10 ENCOUNTER — Encounter: Payer: Self-pay | Admitting: Family Medicine

## 2013-11-10 NOTE — Telephone Encounter (Signed)
Patient returning my call states that the medical release form was signed on this past Monday around 7:30 am with [facility] at check in, I will get with [facility] to get this form, I told the patient that I will call her back once I have this faxed to [place].

## 2013-11-10 NOTE — Telephone Encounter (Signed)
Called patient back and informed her that he documents have been faxed to [place].

## 2013-11-10 NOTE — Progress Notes (Unsigned)
Forms placed in PCP box for completion. 

## 2013-11-10 NOTE — Telephone Encounter (Signed)
Called patient back as promised to let her know that her documents have been faxed to [place].

## 2013-11-10 NOTE — Progress Notes (Unsigned)
Patient dropped off FMLA forms to be filled out.  Please fax when completed. °

## 2013-11-15 ENCOUNTER — Telehealth: Payer: Self-pay | Admitting: Adult Health

## 2013-11-15 NOTE — Telephone Encounter (Signed)
Patient calling to give [name] feedback that the Zoloft and last pain pill given did not help. Her headache pain is at a 4.  Best number to call is [phone].

## 2013-11-16 ENCOUNTER — Telehealth: Payer: Self-pay | Admitting: Family Medicine

## 2013-11-16 NOTE — Telephone Encounter (Signed)
Message was sent to [facility] with an update on the patient, states the Zoloft and the last pain medication that was prescribed has not helped and her headache is a 4 on the scale.  Please advise.

## 2013-11-16 NOTE — Telephone Encounter (Signed)
[name] from pt's fmla/disability office calls, was told by pt that Dr. Ree Kida has "changed his mind" about restricting pt at work. [name] would like Dr. Ree Kida to verify this and give any updated info that is needed. Pls call [name] at [phone] ext [phone].

## 2013-11-16 NOTE — Telephone Encounter (Signed)
Patient states that her headache has increased to a 4 on a scale of 1-10. I will increase the tizanidine 2 mg to 2 tablets in the morning, 1 tablet at noon and 2 tablets at night. Patient verbalized understanding. We will increase this first and in the future we will consider increasing the Zoloft if she continues to not find it effective.

## 2013-11-17 NOTE — Telephone Encounter (Signed)
Beth called again.  Is wondering if the pt is restricted from work until next appt [phone]  ext [phone]

## 2013-11-17 NOTE — Telephone Encounter (Signed)
Returned call to [place], no answer, left message that I will attempt to call again tomorrow morning

## 2013-11-18 ENCOUNTER — Telehealth: Payer: Self-pay | Admitting: Family Medicine

## 2013-11-18 NOTE — Telephone Encounter (Signed)
Attempted to call Bet again. Left message stating that we (Dr. Alease Frame and I) have not cleared her to return to work yet, patient has upcoming follow up appointment

## 2013-11-18 NOTE — Telephone Encounter (Signed)
Called [name] at number provided with regards to [facility] paperwork.  They are asking for specific dates for [name] to be out of work.  As this paperwork was initiated for a neurological problem, I feel that a neurologist's assessment is vital.  Her paperwork has been filled out with several disclaimers deferring her total out of work days/date to her neurologist.  I do feel that the debilitation caused by migraine warrants FMLA.  I attempted to call [name] on both her cell and home phones.  I've left a message on her home phone asking her to call us back regarding this matter.  Looking at her neurologist's notes, she is scheduled for a f/u with them in October.  I think she should discuss this paperwork with them regarding specific out of work dates/ timeline.  Courtney M. Lajuana Ripple, DO

## 2013-11-18 NOTE — Telephone Encounter (Signed)
I spoke to Dr. Alease Frame and he plans to complete paperwork this afternoon. Patient can pick up paperwork on Monday morning.   Note to nursing staff - please call patient and inform her of the above.

## 2013-11-18 NOTE — Telephone Encounter (Signed)
Pt called and wanted a update on the fmla papers jw

## 2013-11-21 NOTE — Telephone Encounter (Signed)
Spoke with [name] on 11/18/13. Informed her that Dr. Alease Frame was completing disability/insurance form. Part of the form was not able to be completed as would require functional capacity evaluation. Patient made aware that this testing may be required in the future. Dr. Alease Frame to leaves forms in front office for pickup.

## 2013-12-13 ENCOUNTER — Ambulatory Visit: Payer: PRIVATE HEALTH INSURANCE | Admitting: Nurse Practitioner

## 2013-12-16 ENCOUNTER — Ambulatory Visit (INDEPENDENT_AMBULATORY_CARE_PROVIDER_SITE_OTHER): Payer: PRIVATE HEALTH INSURANCE | Admitting: Family Medicine

## 2013-12-16 ENCOUNTER — Encounter: Payer: Self-pay | Admitting: Family Medicine

## 2013-12-16 VITALS — BP 131/86 | HR 83 | Temp 98.0°F | Ht 64.0 in | Wt 165.0 lb

## 2013-12-16 DIAGNOSIS — G43809 Other migraine, not intractable, without status migrainosus: Secondary | ICD-10-CM

## 2013-12-16 DIAGNOSIS — R42 Dizziness and giddiness: Secondary | ICD-10-CM

## 2013-12-16 NOTE — Progress Notes (Addendum)
Patient ID: [name], female   DOB: 07-30-1950, 63 y.o.   MRN: [id number]   HPI  Patient presents today for continued dizziness. Patient is a 63 year old female presenting with continued dizziness. Patient has a long difficult history including a short hospital stay for a possible TIA. She has been experiencing significant debilitating migrainous symptoms since the time of her hospitalization. She also has been experiencing right-sided weakness. Today his primary complaint is that of dizziness. She states she has been experiencing intermittent dizziness which causes her to fall again some walls. She is not at this time taking any true falls because she has caught herself every time. 2 bruises are noted on her forearms which has been noted by both patient and husband to be due to these falls. No nystagmus is present. No history of seizures. Patient has not lost consciousness. Patient had recent CT and MRI during hospital stay which were found to be absence of any acute pathology or neoplasm.  At this time these symptoms have not found a physical illness or pathology to which they are caused. Physical exam yielded inconsistent results of patient's strength. Considering some suspicion for somatoform-like disorder especially due to the fact that these symptoms reportedly began shortly after the death of her father. I will continue to look into this issue for a possible physical diagnosis.  Patient denies any chills fever shortness of breath nausea vomiting diarrhea heat/cold intolerance chest pain blurry vision dysuria cough loss of consciousness.  Smoking status noted ROS: Per HPI  Objective: BP 131/86  Pulse 83  Temp(Src) 98 F (36.7 C) (Oral)  Ht 5\' 4"  (1.626 m)  Wt 165 lb (74.844 kg)  BMI 28.31 kg/m2 Gen: NAD, alert, cooperative with exam; flat affect HEENT: NCAT, EOMI, PERRL CV: RRR, good S1/S2, no murmur Resp: CTABL, no wheezes, non-labored Abd: SNTND, BS present, no guarding or  organomegaly Ext: No edema, warm Neuro: Alert and oriented, patient has significant right-sided weakness throughout. She also claims to have right-sided diminished sensation. Of note, gait is normal although slow. No nystagmus is present. Exam was not always consistent with right-sided weakness, for example, weakness turning head to the right side was observed signifying left-sided SCM weakness (rather than right). Also when asked shrug her shoulders resistance was noted to be symmetric initially (fraction of a second) with right-sided immediate weakness/collapse of the right shoulder. Reflexes intact bilaterally  Assessment and plan:  Dizziness Patient presents with continued dizziness which is intermittent. No observed inciting events. Nothing seems to help these episodes with the exception of moving herself to a quiet room. Patient states that these episodes last all day after onset. No nystagmus was noted throughout her exam. Patient has extremely vague symptoms. Muscle strength noted significant right-sided weakness however weakness did not always make physiological sense (decreased strength turning head to right side was noted, this would signify left-sided SCM weakness rather than right-sided). Suspicious of somatoform type etiology. "Medically unexplained symptoms" may be the best fit for diagnosis AT THIS TIME. - I've made a referral to physical therapy. Specifically for [facility] for vestibular rehabilitation.  Migraine variant Patient states that her neurologist is been helping her with this issue. Patient reports decreased symptoms at this time. At this time I will make no changes to her medication.    Orders Placed This Encounter  Procedures  . Ambulatory referral to Physical Therapy    Referral Priority:  Routine    Referral Type:  Physical Medicine    Referral  Reason:  Specialty Services Required    Requested Specialty:  Physical Therapy    Number of Visits  Requested:  1    No orders of the defined types were placed in this encounter.     Elberta Leatherwood, MD,MS,  PGY1 12/16/2013 6:42 PM

## 2013-12-16 NOTE — Assessment & Plan Note (Addendum)
Patient states that her neurologist is been helping her with this issue. Patient reports decreased symptoms at this time. At this time I will make no changes to her medication.

## 2013-12-16 NOTE — Assessment & Plan Note (Addendum)
Patient presents with continued dizziness which is intermittent. No observed inciting events. Nothing seems to help these episodes with the exception of moving herself to a quiet room. Patient states that these episodes last all day after onset. No nystagmus was noted throughout her exam. Patient has extremely vague symptoms. Muscle strength noted significant right-sided weakness however weakness did not always make physiological sense (decreased strength turning head to right side was noted, this would signify left-sided SCM weakness rather than right-sided). Suspicious of somatoform type etiology. "Medically unexplained symptoms" may be the best fit for diagnosis AT THIS TIME. - I've made a referral to physical therapy. Specifically for [facility] for vestibular rehabilitation.

## 2013-12-16 NOTE — Patient Instructions (Signed)
It was a please seeing you today in our clinic. Today we discussed your dizziness. Here is the treatment plan we have discussed and agreed upon together:   - I will refer you to Vestibular Rehab. You should receive a call regarding this within the next week - currently I will not be changing any of your medications.

## 2013-12-19 ENCOUNTER — Telehealth: Payer: Self-pay | Admitting: Family Medicine

## 2013-12-19 NOTE — Telephone Encounter (Signed)
Loa Socks - I think that this message was for you (I took a quick look in your mailbox and I saw some paperwork for Ms Dorsainvil)

## 2013-12-19 NOTE — Telephone Encounter (Signed)
Received call from Dr. Ree Kida stating we needed their fax number, it is [phone]

## 2014-01-03 ENCOUNTER — Ambulatory Visit (INDEPENDENT_AMBULATORY_CARE_PROVIDER_SITE_OTHER): Payer: PRIVATE HEALTH INSURANCE | Admitting: Adult Health

## 2014-01-03 ENCOUNTER — Encounter: Payer: Self-pay | Admitting: Adult Health

## 2014-01-03 VITALS — BP 101/69 | HR 93 | Ht 67.0 in | Wt 168.6 lb

## 2014-01-03 DIAGNOSIS — G43909 Migraine, unspecified, not intractable, without status migrainosus: Secondary | ICD-10-CM | POA: Insufficient documentation

## 2014-01-03 DIAGNOSIS — G43019 Migraine without aura, intractable, without status migrainosus: Secondary | ICD-10-CM

## 2014-01-03 MED ORDER — TOPIRAMATE 50 MG PO TABS: 50.0000 mg | ORAL_TABLET | Freq: Every morning | ORAL | Status: DC

## 2014-01-03 NOTE — Progress Notes (Signed)
I have read the note, and I agree with the clinical assessment and plan.  Darcey Cardy KEITH   

## 2014-01-03 NOTE — Patient Instructions (Signed)

## 2014-01-03 NOTE — Progress Notes (Signed)
PATIENT: [name] DOB: 1950/07/21  REASON FOR VISIT: follow up HISTORY FROM: patient  HISTORY OF PRESENT ILLNESS: [name] is a 63 year old female with a history of migraines. She returns today for follow-up. She is currently taking tizanidine 4 mg in the morning, 2 mg at noon and 4 mg at night. She reports that her headaches have improved. She may still have a daily dull headache. She has not had a severe headache since the last visit. She does have nausea with her headaches but no vomiting. + photophobia and phonophobia. She has continued to have the right sided weakness. She continues to do exercises at home to improve her strength.  She was recently seen by her PCP for dizziness. She was referred to neuro rehab for vestibular rehab.   HISTORY 11/03/13: 63 year old female with a history of migraines. Patient returns today stating that her headache has not improved. She states that it is located on the right side of her head. She rates her pain 4 /10. She has sensory changes on the right side. She also feels weaker on the right side. When I saw this patient a week ago she had been admitted to the hospital and stroke was ruled out. She was given a Depacon infusion in the office that brought her pain level down to a 4/10 and it has remained that way since the visit. She has stage 3 CKD. Patient husband feels that she is depressed. She agrees. States that since her dad passed she has been having a " hard time." She would like to consider anti-depressant medication. She states that the headache doesn't help.   REVIEW OF SYSTEMS: Full 14 system review of systems performed and notable only for:  Constitutional: Chills, fatigue, unexpected weight change Eyes: N/A Ear/Nose/Throat: N/A  Skin: N/A  Cardiovascular: N/A  Respiratory: Chest tightness Gastrointestinal: N/A  Genitourinary: N/A Hematology/Lymphatic: Bruise/bleed easily Endocrine: Cold intolerance, heat intolerance, flushing    Musculoskeletal: Walking difficulty  Allergy/Immunology: N/A  Neurological: Memory loss, dizziness, headache, weakness Psychiatric: Agitation, behavior problem, confusion Sleep: Restless leg, insomnia   ALLERGIES: Allergies  Allergen Reactions  . Shellfish Allergy Anaphylaxis and Other (See Comments)    All seafood    HOME MEDICATIONS: Outpatient Prescriptions Prior to Visit  Medication Sig Dispense Refill  . clopidogrel (PLAVIX) 75 MG tablet Take 1 tablet (75 mg total) by mouth daily.  30 tablet  3  . doxepin (SINEQUAN) 100 MG capsule Take 100 mg by mouth at bedtime.      Marland Kitchen esomeprazole (NEXIUM) 40 MG capsule Take 40 mg by mouth daily before breakfast.      . gabapentin (NEURONTIN) 300 MG capsule Take 300 mg by mouth at bedtime.      . lidocaine (LIDODERM) 5 % Place 1 patch onto the skin daily. Remove & Discard patch within 12 hours or as directed by MD      . ramipril (ALTACE) 5 MG tablet Take 5 mg by mouth daily.      . sertraline (ZOLOFT) 25 MG tablet Take 1 tablet (25 mg total) by mouth daily.  30 tablet  3  . tiZANidine (ZANAFLEX) 2 MG tablet Take 1 tablet by mouth twice a day for one week, then increase to three times a day thereafter.  90 tablet  3  . topiramate (TOPAMAX) 100 MG tablet Take 1 tablet (100 mg total) by mouth at bedtime.  90 tablet  2  . fluticasone (FLONASE) 50 MCG/ACT nasal  spray Place 2 sprays into both nostrils daily as needed for allergies or rhinitis.       Facility-Administered Medications Prior to Visit  Medication Dose Route Frequency Provider Last Rate Last Dose  . valproate (DEPACON) 500 mg in sodium chloride 0.9 % 100 mL IVPB  500 mg Intravenous Continuous Ward Givens, NP   500 mg at 10/25/13 1051  . valproate (DEPACON) 500 mg in sodium chloride 0.9 % 100 mL IVPB  500 mg Intravenous Continuous Ward Givens, NP   500 mg at 11/03/13 3500    PAST MEDICAL HISTORY: Past Medical History  Diagnosis Date  . Hyperlipidemia   . Migraine headache    . GERD (gastroesophageal reflux disease)   . Occipital neuralgia     Left  . Chronic kidney disease     stage 3    PAST SURGICAL HISTORY: Past Surgical History  Procedure Laterality Date  . Abdominal hysterectomy    . Knee surgery    . Carpal tunnel release    . Elbow surgery    . Lung surgery    . Bladder repair    . Cervical spine surgery      FAMILY HISTORY: Family History  Problem Relation Age of Onset  . Cancer Mother 77    breast  . Alzheimer's disease Mother   . Cancer Brother   . Hyperlipidemia Brother     SOCIAL HISTORY: History   Social History  . Marital Status: Married    Spouse Name: [name]    Number of Children: 1  . Years of Education: 12+   Occupational History  . ACTIVITY DIRECTOR   . CNA    Social History Main Topics  . Smoking status: Former Smoker -- 0.30 packs/day    Types: Cigarettes    Quit date: 10/31/2012  . Smokeless tobacco: Never Used  . Alcohol Use: No  . Drug Use: No  . Sexual Activity: Yes    Partners: Male    Birth Control/ Protection: Surgical     Comment: 1 sexual partner in last 12 months: married   Other Topics Concern  . Not on file   Social History Narrative   Patient is married ([name]).   Patient has one child.   Patient is working full-time.   Patient is right handed.   Patient has a college education.   Patient drinks 6-8 cups of coffee daily.Marland Kitchen      PHYSICAL EXAM  Filed Vitals:   01/03/14 0905  BP: 101/69  Pulse: 93  Height: 5\' 7"  (1.702 m)  Weight: 168 lb 9.6 oz (76.476 kg)   Body mass index is 26.4 kg/(m^2).  Generalized: Well developed, in no acute distress   Neurological examination  Mentation: Alert oriented to time, place, history taking. Follows all commands speech and language fluent Cranial nerve II-XII: Pupils were equal round reactive to light. Extraocular movements were full, visual field were full on confrontational test. Facial sensation was decreased to soft touch on the right.  Strength were normal. Uvula tongue midline. Head turning and shoulder shrug  were normal and symmetric. Motor: The motor testing reveals 5 over 5 strength of all left upper and lower extremity and right lower extremity. 4/5 in the right upper extremity. Good symmetric motor tone is noted throughout.  Sensory: Sensory testing is intact to soft touch on all 4 extremities except decrease in the right arm and right side of face. No evidence of extinction is noted.  Coordination: Cerebellar testing reveals good finger-nose-finger and  heel-to-shin bilaterally.  Gait and station: Gait is normal. Tandem gait is normal. Romberg is negative. No drift is seen.  Reflexes: Deep tendon reflexes are symmetric and normal bilaterally.    DIAGNOSTIC DATA (LABS, IMAGING, TESTING) - I reviewed patient records, labs, notes, testing and imaging myself where available.  Lab Results  Component Value Date   WBC 8.4 11/09/2013   HGB 14.5 11/09/2013   HCT 42.8 11/09/2013   MCV 86.8 11/09/2013   PLT 250 11/09/2013      Component Value Date/Time   NA 139 11/09/2013 1544   K 3.7 11/09/2013 1544   CL 102 11/09/2013 1544   CO2 24 11/09/2013 1544   GLUCOSE 95 11/09/2013 1544   BUN 11 11/09/2013 1544   CREATININE 1.32* 11/09/2013 1544   CREATININE 1.22* 10/12/2013 0512   CALCIUM 9.1 11/09/2013 1544   PROT 7.1 10/10/2013 1521   ALBUMIN 4.0 10/10/2013 1521   AST 17 10/10/2013 1521   ALT 20 10/10/2013 1521   ALKPHOS 108 10/10/2013 1521   BILITOT 0.2* 10/10/2013 1521   GFRNONAA 43* 11/09/2013 1544   GFRNONAA 47* 10/12/2013 0512   GFRAA 50* 11/09/2013 1544   GFRAA 54* 10/12/2013 0512   Lab Results  Component Value Date   CHOL 204* 10/11/2013   HDL 55 10/11/2013   LDLCALC 122* 10/11/2013   TRIG 133 10/11/2013   CHOLHDL 3.7 10/11/2013   Lab Results  Component Value Date   HGBA1C 5.9* 10/10/2013    ASSESSMENT AND PLAN 63 y.o. year old female  has a past medical history of Hyperlipidemia; Migraine headache; GERD (gastroesophageal reflux disease); Occipital  neuralgia; and Chronic kidney disease. here with:  1. Migraine 2. Dizziness  Patient's headaches have improved but she continues to have a dull daily headache. I will increase the Topamax. She will take 50 mg in the AM and 100 mg in the PM. She will continue taking tizanidine as prescribed. Today she states that the dizziness is her main concern not the headaches. Her PCP has evaluated her for dizziness and referred her to vestibular rehab. I encouraged the patient to follow through with vestibular rehab. If her headache frequency or severity increases she should let us know. Otherwise she will follow-up in 4 months or sooner if needed.    Ward Givens, MSN, NP-C 01/03/2014, 9:22 AM [facility] [address] [place], [place] [address] [phone]  Note: This document was prepared with digital dictation and possible smart phrase technology. Any transcriptional errors that result from this process are unintentional.

## 2014-01-10 ENCOUNTER — Encounter: Payer: Self-pay | Admitting: Family Medicine

## 2014-01-10 ENCOUNTER — Ambulatory Visit (INDEPENDENT_AMBULATORY_CARE_PROVIDER_SITE_OTHER): Payer: PRIVATE HEALTH INSURANCE | Admitting: Family Medicine

## 2014-01-10 VITALS — BP 98/67 | HR 87 | Temp 98.2°F | Ht 67.0 in | Wt 167.0 lb

## 2014-01-10 DIAGNOSIS — R42 Dizziness and giddiness: Secondary | ICD-10-CM

## 2014-01-10 MED ORDER — ESOMEPRAZOLE MAGNESIUM 40 MG PO CPDR: 40.0000 mg | DELAYED_RELEASE_CAPSULE | Freq: Every day | ORAL | Status: DC

## 2014-01-10 NOTE — Progress Notes (Signed)
Courtney Lynch, MD, MS Phone: [phone]  Subjective:  Chief complaint  Pt Here for reviewing filling out paperwork and medication refill.  Patient is here for medication refill as well as to fill out and go through paperwork from her insurance company. Majority of our time was spent discussing her current symptoms as well as her ability to return to work and the level of functionality required for her to return to work properly and safely. Patient continues to have issues with dizziness/vertigo. She has gait instability which she describes as if she is "intoxicated". I was able to examine patient's current status as well as determine the amount of functionality she will require when she goes back to work. I feel confident that patient would benefit greatly from neuro rehabilitation and physical therapy. Patient's neurologist has referred her to the neuro rehabilitation center. I have urged her to call them later today. Patient stated that she would and appeared very motivated to do so.   Review of Systems  Constitutional: Positive for malaise/fatigue. Negative for fever, chills and weight loss.  HENT: Negative for hearing loss.   Eyes: Negative for blurred vision.  Respiratory: Negative for cough.   Cardiovascular: Negative for chest pain.  Gastrointestinal: Negative for nausea, vomiting and diarrhea.  Neurological: Positive for sensory change, focal weakness and headaches.  Psychiatric/Behavioral: Positive for depression.     Past Medical History Patient Active Problem List   Diagnosis Date Noted  . Migraine 01/03/2014  . Dizziness 11/09/2013  . Adjustment disorder with mixed anxiety and depressed mood 11/03/2013  . Migraine with status migrainosus 10/26/2013  . Migraine variant 10/10/2013  . Disturbance of skin sensation 10/10/2013  . UTI (lower urinary tract infection) 10/10/2013  . TIA (transient ischemic attack) 10/10/2013  . HTN (hypertension) 07/18/2013  . 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 Neuralgia, neuritis, and radiculitis, unspecified 02/04/2012  . Migraine without aura, with intractable migraine, so stated, without mention of status migrainosus 02/04/2012  . 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 Myofascial muscle pain 07/15/2011    Medications- reviewed and updated Current Outpatient Prescriptions  Medication Sig Dispense Refill  . clopidogrel (PLAVIX) 75 MG tablet Take 1 tablet (75 mg total) by mouth daily. 30 tablet 3  . doxepin (SINEQUAN) 100 MG capsule Take 100 mg by mouth at bedtime.    Marland Kitchen esomeprazole (NEXIUM) 40 MG capsule Take 1 capsule (40 mg total) by mouth daily before breakfast. 30 capsule 3  . gabapentin (NEURONTIN) 300 MG capsule Take 300 mg by mouth at bedtime.    . lidocaine (LIDODERM) 5 % Place 1 patch onto the skin daily. Remove & Discard patch within 12 hours or as directed by MD    . ramipril (ALTACE) 5 MG tablet Take 5 mg by mouth daily.    . sertraline (ZOLOFT) 25 MG tablet Take 1 tablet (25 mg total) by mouth daily. 30 tablet 3  . tiZANidine (ZANAFLEX) 2 MG tablet Take 1 tablet by mouth twice a day for one week, then increase to three times a day thereafter. 90 tablet 3  . topiramate (TOPAMAX) 100 MG tablet Take 1 tablet (100 mg total) by mouth at bedtime. 90 tablet 2  . topiramate (TOPAMAX) 50 MG tablet Take 1 tablet (50 mg total) by mouth every morning. 30 tablet 5   Current Facility-Administered Medications  Medication Dose Route Frequency Provider Last Rate Last Dose  . valproate (DEPACON) 500 mg in sodium chloride 0.9 % 100 mL IVPB  500 mg Intravenous Continuous Ward Givens,  NP   Stopped at 10/25/13 1156  . valproate (DEPACON) 500 mg in sodium chloride 0.9 % 100 mL IVPB  500 mg Intravenous Continuous Ward Givens, NP   Stopped at 11/03/13 0955    Objective: BP 98/67 mmHg  Pulse 87  Temp(Src) 98.2 F (36.8 C) (Oral)  Ht 5\' 7"  (1.702 m)  Wt 167 lb (75.751 kg)  BMI 26.15 kg/m2 Gen: NAD, alert, cooperative  with exam HEENT: NCAT, EOMI, PERRL CV: RRR, good S1/S2, no murmur Resp: CTABL, no wheezes, non-labored Abd: Soft, Non Tender, Non Distended, BS present, no guarding or organomegaly Ext: No edema, warm Neuro: Alert and oriented, 4/5 strength right extremities, 5/5 strength left extremities. Romberg test negative (changed from positive on previous clinic visit). Fine motor movement intact. Heel to toe walk with significant unbalance. No weakness in heel walking or toe walking appreciated. Pronator drift no longer present on right side.   Assessment/Plan:  Dizziness Patient continues to experiencing some symptoms. Symptoms are currently affecting her daily life. Patient has trouble walking. Gait is slow and careful. Patient also has difficulty rising or laying with any speed, as this causes significant vertigo/dizziness. Physical exam was significant for right-sided weakness compared to the left. Romberg test was no longer positive as it was in previous exams. Pronator drift was no longer present on right side. Fine motor movement appeared to be intact however all motion with right side was sluggish compared to left.  Patient has been referred to neuro rehabilitation by her neurologist. I have urged her to call this facility and to be seen as soon as possible. I have asked that she be seen and cared for at least once before our next visit. Patient understood and voiced agreement.    No orders of the defined types were placed in this encounter.    Meds ordered this encounter  Medications  . esomeprazole (NEXIUM) 40 MG capsule    Sig: Take 1 capsule (40 mg total) by mouth daily before breakfast.    Dispense:  30 capsule    Refill:  3

## 2014-01-10 NOTE — Assessment & Plan Note (Signed)
Patient continues to experiencing some symptoms. Symptoms are currently affecting her daily life. Patient has trouble walking. Gait is slow and careful. Patient also has difficulty rising or laying with any speed, as this causes significant vertigo/dizziness. Physical exam was significant for right-sided weakness compared to the left. Romberg test was no longer positive as it was in previous exams. Pronator drift was no longer present on right side. Fine motor movement appeared to be intact however all motion with right side was sluggish compared to left.  Patient has been referred to neuro rehabilitation by her neurologist. I have urged her to call this facility and to be seen as soon as possible. I have asked that she be seen and cared for at least once before our next visit. Patient understood and voiced agreement.

## 2014-01-10 NOTE — Patient Instructions (Signed)
It was a pleasure seeing you today in our clinic. Today we discussed paperwork, dizziness. Here is the treatment plan we have discussed and agreed upon together:   - we filled out insurance papperwork. - PLEASE call your Neuro Rehab Facility, see them ASAP

## 2014-01-11 ENCOUNTER — Other Ambulatory Visit: Payer: Self-pay | Admitting: Neurology

## 2014-01-13 ENCOUNTER — Other Ambulatory Visit: Payer: Self-pay | Admitting: Neurology

## 2014-01-16 ENCOUNTER — Other Ambulatory Visit: Payer: Self-pay | Admitting: Neurology

## 2014-01-20 ENCOUNTER — Telehealth: Payer: Self-pay | Admitting: *Deleted

## 2014-01-20 NOTE — Telephone Encounter (Signed)
[place] and Dr Jannifer Franklin to be completed 01-20-14.

## 2014-01-20 NOTE — Telephone Encounter (Signed)
Form,Principal to Nurse Butch Penny and Dr Jannifer Franklin to be completed 01-20-14.

## 2014-01-23 DIAGNOSIS — Z0289 Encounter for other administrative examinations: Secondary | ICD-10-CM

## 2014-01-27 ENCOUNTER — Ambulatory Visit
Payer: PRIVATE HEALTH INSURANCE | Attending: Family Medicine | Admitting: Rehabilitative and Restorative Service Providers"

## 2014-01-27 ENCOUNTER — Encounter: Payer: Self-pay | Admitting: Rehabilitative and Restorative Service Providers"

## 2014-01-27 DIAGNOSIS — H8111 Benign paroxysmal vertigo, right ear: Secondary | ICD-10-CM

## 2014-01-27 DIAGNOSIS — R269 Unspecified abnormalities of gait and mobility: Secondary | ICD-10-CM | POA: Diagnosis not present

## 2014-01-27 DIAGNOSIS — Z5189 Encounter for other specified aftercare: Secondary | ICD-10-CM | POA: Diagnosis not present

## 2014-01-27 NOTE — Therapy (Signed)
Physical Therapy Evaluation  Patient Details  Name: [name] MRN: [id number] Date of Birth: 07/10/1950  Encounter Date: 01/27/2014      PT End of Session - 01/27/14 0959    Visit Number 1   Number of Visits 6   Date for PT Re-Evaluation 02/26/14   PT Start Time 0804   PT Stop Time 0846   PT Time Calculation (min) 42 min   Activity Tolerance Patient tolerated treatment well      Past Medical History  Diagnosis Date  . Hyperlipidemia   . Migraine headache   . GERD (gastroesophageal reflux disease)   . Occipital neuralgia     Left  . Chronic kidney disease     stage 3    Past Surgical History  Procedure Laterality Date  . Abdominal hysterectomy    . Knee surgery    . Carpal tunnel release    . Elbow surgery    . Lung surgery    . Bladder repair    . Cervical spine surgery      There were no vitals taken for this visit.  Visit Diagnosis:  BPPV (benign paroxysmal positional vertigo), right  Abnormality of gait      Subjective Assessment - 01/27/14 0810    Symptoms The patient reports onset of vertigo in July 2015 associated with a headache and possible TIA (dx at urgent care) when she was at work.  She initially noted dizziness only when she also experienced HA and HTN.  She also had memory changes and headache and was hospitalized x 3 days.  She noted the HA would be high in intensity after d/c and she received injections of migraine medications, which helped the headache, but not the dizziness.  She is now taking a depression medication without relief from dizziness.  The patient reports when she rolls to the right, the whole room spins and when she gets up, she cannot move quickly until symptoms settle.                                                            Pertinent History Recent dx of TIAs and worsening migraines.    Diagnostic tests MRI, CT WNLs per patient report.   Patient Stated Goals Diminish symptoms and be able to return to work.   Currently  in Pain? No/denies          [facility] PT Assessment - 01/27/14 0001    Precautions   Precautions Fall   Precaution Comments worse with turns   Balance Screen   Has the patient fallen in the past 6 months Yes  Reports    How many times? 10+  since August   Has the patient had a decrease in activity level because of a fear of falling?  Yes   Is the patient reluctant to leave their home because of a fear of falling?  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   Baseline 02/26/2014   Time 4   Period Weeks   Status New   PT LONG TERM GOAL #2   Title The patient will have negative positional testing for BPPV.   Baseline 02/26/2014   Time 4   Period Weeks   Status New   PT LONG TERM GOAL #3   Title The patient will have decreased [place] < or equal to 45% to demo improved subjective dizziness.   Baseline 02/26/2014   Time 4   Period Weeks   Status New   PT LONG TERM GOAL #4   Title PT to further assess balance and address via HEP due to high # of reported  falls.   Baseline 02/26/2014   Time 4   Period Weeks   Status New          Plan - 01/27/14 1000    Clinical Impression Statement The patient has multi-factorial dizziness with (+) positional testing for R BPPV, h/o migraines and recent h/o TIAs. The patient also reports recent onset of frequent falls that need to be addressed once BPPV cleared.   Pt will benefit from skilled therapeutic intervention in order to improve on the following deficits Decreased balance;Postural dysfunction;Difficulty walking;Abnormal gait;Other (comment)  dizziness   Rehab Potential Good   Clinical Impairments Affecting Rehab Potential migraines   PT Frequency 2x / week  x 1 week   PT Duration 4 weeks  followed by 1x/week x 3 weeks   PT Treatment/Interventions Balance training;Gait training;Neuromuscular re-education   PT Next Visit Plan Re-check BPPV, treat with CRT as indicated.  Address decreased VOR and balance deficits to be further assessed.        Problem List Patient Active Problem List   Diagnosis Date Noted  . Migraine 01/03/2014  . Dizziness 11/09/2013  . Adjustment disorder with mixed anxiety and depressed mood 11/03/2013  . Migraine with status migrainosus 10/26/2013  . Migraine variant 10/10/2013  . Disturbance of skin sensation 10/10/2013  . UTI (lower urinary tract infection) 10/10/2013  . TIA (transient ischemic attack) 10/10/2013  . HTN (hypertension) 07/18/2013  . 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 Neuralgia, neuritis, and radiculitis, unspecified 02/04/2012  . Migraine without aura, with intractable migraine, so stated, without mention of status migrainosus 02/04/2012  . 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 Myofascial muscle pain 07/15/2011              Vestibular Assessment - 01/27/14 0816    Type of Dizziness Spinning  spinning x seconds; imbalance; or drunk sensation   Frequency of Dizziness --  imbalance and "drunk" sensation lasts all day;  intermittent   Duration of Dizziness --  spinning lasts for seconds; other symptoms last longer   Aggravating Factors Turning body quickly;Turning head  quickly   Relieving Factors Head stationary   Occulomotor Alignment Abnormal  R eye positioned mildly inferior to L   Spontaneous Absent   Gaze-induced Absent   Smooth Pursuits Intact   Saccades Intact   Comment Pt notes sensation of "could spin" with R field of vision movements   VOR 1 Head Only (x 1 viewing) --  head thrust test (+)  right side for refixation saccade   VOR to Slow Head Movement Normal  WNLs eye mvmts, provokes dizziness to R to 5/10   VOR Cancellation Comment  patient reports dizziness 5/10 with activity; keeps gaze   Comment Pt with decreased VOR worse to R side per head thrust test   Dix-Hallpike Dix-Hallpike Right  1st rep slow with min nystagmus, 2nd rep done faster   Sidelying Test Sidelying Right  (+) for upbeat, rotary  nystagmus, L testing (-)   Horizontal Canal Testing Horizontal Canal Right  (+) for nystagmus hard to determine direction in room light   Dix-Hallpike Right Duration --  2nd rep=20+ seconds of nystagmus and spinning   Dix-Hallpike Right Symptoms Upbeat, right rotatory nystagmus   Sidelying Right Duration seconds   Sidelying Right Symptoms Upbeat, right rotatory nystagmus   Horizontal Canal Right Duration seconds   Horizontal Canal Right Symptoms --  hard to determine in room light          Vestibular Treatment/Exercise - 01/27/14 0844    Vestibular Treatment Provided Canalith Repositioning   Canalith Repositioning Epley [place] Right  x 2 reps with nystagmus resolved 2nd rep, mild dizziness   Number of Reps 1 --  2   Overall Response 1 Improved Symptoms   Response Details 1 --  significant improvement=no nystagmus, but mild dizziness                                      Rasha Ibe 01/27/2014, 10:09 AM Rudell Cobb, MPT [facility] [phone]

## 2014-01-27 NOTE — Patient Instructions (Signed)
Benign Positional Vertigo

## 2014-02-01 ENCOUNTER — Telehealth: Payer: Self-pay | Admitting: Neurology

## 2014-02-01 NOTE — Telephone Encounter (Signed)
This patient apparently had onset of right-sided weakness of the arm and leg on 10/10/2013. She went into the hospital, MRI evaluation the brain was completely normal. The patient has had persistent weakness on the right arm, and now she reports dizziness and blacking out episodes, currently in vestibular rehabilitation. The etiology of all of these symptoms is not clear. At some point I'll need to see exam in this patient. 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 She remains out of work at this time.

## 2014-02-06 NOTE — Telephone Encounter (Signed)
Form completed and given to [facility] for medical records.

## 2014-02-07 NOTE — Telephone Encounter (Signed)
Form,Principal received completed by Nurse Butch Penny and Dr Jannifer Franklin faxed 02-07-14.

## 2014-02-09 ENCOUNTER — Ambulatory Visit
Payer: PRIVATE HEALTH INSURANCE | Attending: Family Medicine | Admitting: Rehabilitative and Restorative Service Providers"

## 2014-02-09 DIAGNOSIS — Z5189 Encounter for other specified aftercare: Secondary | ICD-10-CM | POA: Diagnosis not present

## 2014-02-09 DIAGNOSIS — R269 Unspecified abnormalities of gait and mobility: Secondary | ICD-10-CM | POA: Insufficient documentation

## 2014-02-09 DIAGNOSIS — H8111 Benign paroxysmal vertigo, right ear: Secondary | ICD-10-CM | POA: Diagnosis not present

## 2014-02-09 NOTE — Patient Instructions (Addendum)
Sit to Side-Lying   Sit on edge of bed. 1. Turn head 45 to right. 2. Maintain head position and lie down slowly on left side. Hold until symptoms subside. 3. Sit up slowly. Hold until symptoms subside. 4. Turn head 45 to left. 5. Maintain head position and lie down slowly on right side. Hold until symptoms subside. 6. Sit up slowly. Repeat sequence _3-5___ times per session. Do __2__ sessions per day.  Copyright  VHI. All rights reserved.  Rolling   With pillow under head, start on back. Roll slowly to right. Hold position until symptoms subside. Roll slowly onto left side. Hold position until symptoms subside. Repeat sequence _3-5___ times per session. Do _2___ sessions per day.  Copyright  VHI. All rights reserved.    Gaze Stabilization: Standing Feet Apart   Feet shoulder width apart, keeping eyes on target on wall _3___ feet away, tilt head down 15-30 and move head side to side for __30__ seconds. Repeat while moving head up and down for _5-10___ seconds. Do _3___ sessions per day. Repeat using target on pattern background.  Copyright  VHI. All rights reserved.  Special Instructions: Exercises may bring on mild to moderate symptoms of headache, dizziness, nausea that resolve within 20-30 minutes of completing exercises. If symptoms are lasting longer than 30 minutes, modify your exercises by:  >decreasing the # of times you complete each activity >ensuring your symptoms return to baseline before moving onto the next exercise >dividing up exercises so you do not do them all in one session, but multiple short sessions throughout the day >doing them once a day until symptoms improve

## 2014-02-09 NOTE — Therapy (Signed)
[facility] [address] [place], [place], [address] Phone: [phone]   Fax:  [phone]  Physical Therapy Treatment  Patient Details  Name: [name] MRN: [id number] Date of Birth: 08/20/1950  Encounter Date: 02/09/2014      PT End of Session - 02/09/14 0842    Visit Number 2   Number of Visits 6   Date for PT Re-Evaluation 02/26/14   PT Start Time 0803   PT Stop Time 0842   PT Time Calculation (min) 39 min   Activity Tolerance Patient tolerated treatment well   Behavior During Therapy [facility] for tasks assessed/performed      Past Medical History  Diagnosis Date  . Hyperlipidemia   . Migraine headache   . GERD (gastroesophageal reflux disease)   . Occipital neuralgia     Left  . Chronic kidney disease     stage 3    Past Surgical History  Procedure Laterality Date  . Abdominal hysterectomy    . Knee surgery    . Carpal tunnel release    . Elbow surgery    . Lung surgery    . Bladder repair    . Cervical spine surgery      There were no vitals taken for this visit.  Visit Diagnosis:  BPPV (benign paroxysmal positional vertigo), right  Abnormality of gait      Subjective Assessment - 02/09/14 0806    Symptoms The spinning when she rolls to the right is still present but significantly improved.  Overall, feeling like headache and pull to the right with walking is continuing.     Currently in Pain? Yes   Pain Score 5    Pain Location Head   Pain Orientation Right   Pain Type Chronic pain   Pain Onset More than a month ago   Pain Frequency Constant   Aggravating Factors  dizziness aggravates, hypersensitivity to smells, hypersensitivity to lights and television   Pain Relieving Factors topamax              PT Education - 02/09/14 0840    Education provided Yes   Education Details HEP: brandt daroff, rolling and gaze x 1 viewing instructed.   Person(s) Educated Patient   Methods  Explanation;Demonstration;Verbal cues;Handout   Comprehension Returned demonstration;Verbalized understanding              Plan - 02/09/14 0843    Clinical Impression Statement The patient was negative for nystagmus during positional testing, but continues with some subjective report of dizziness.  PT to treat with habituation and progress gaze and balance activities to tolerance.  PT progressing slowly due to constant headaches and recent h/o migraines.  Will monitor response to treatment to ensure intensity appropriate for patient's tolerance level.   PT Next Visit Plan Check habituation and gaze HEP; add walking with head turns in clinic; add bending forward/return to stand for habituation if current program progressing well.  Increase VOR x 1 viewing time for vertical plane.   PT Home Exercise Plan Progress to tolerance.   Consulted and Agree with Plan of Care Patient               Vestibular Assessment - 02/09/14 0812    Dix-Hallpike Dix-Hallpike Right   Sidelying Test Sidelying Right   Horizontal Canal Testing Horizontal Canal Right   Dix-Hallpike Right Duration --  Negative dix hallpike R and L for nystagmus.  Trace dizzy.   Dix-Hallpike Right Symptoms --  mild reports of dizziness with R dix hallpike>sitting.   Sidelying Right Duration 1st repetition of right sidelying is negative for nystagmus or dizziness.  Pt reports sensation of being pulled off midline with return to sitting that lasts x seconds.   Horizontal Canal Right Duration --  mild dizziness that lasts x seconds with R rolling, neg left   Horizontal Canal Right Symptoms --  no nystagmus noted with bilateral horizontal roll         Vestibular Treatment/Exercise - 02/09/14 0809    Vestibular Treatment Provided Habituation;Gaze   Habituation Exercises Laruth Bouchard Daroff;Horizontal Roll   Gaze Exercises X1 Viewing Horizontal;X1 Viewing Vertical   Number of Reps  --  5 reps   Symptom Description  Pt  experiences mild sense of spinning without nystagmus on 2nd and 4th rep.  She has mild nausea that began after third repetition.  Pt rested to let symptoms resolve in between repetitions.  L sidelying did not provoke any symptoms.   Number of Reps  --  5 reps   Symptom Description  --  mild sensation of dizziness with R roll lasting seconds   Foot Position --  standing feet apart   Time --  30 seconds   Reps --  2 reps   Comments --  pt experiences mild nausea and dizziness with horiz gaze   Foot Position --  standing feet apart   Time --  10 seconds   Reps --  2 reps   Comments --  vertical gaze provokes greater nausea/dizziness                       Problem List Patient Active Problem List   Diagnosis Date Noted  . Migraine 01/03/2014  . Dizziness 11/09/2013  . Adjustment disorder with mixed anxiety and depressed mood 11/03/2013  . Migraine with status migrainosus 10/26/2013  . Migraine variant 10/10/2013  . Disturbance of skin sensation 10/10/2013  . UTI (lower urinary tract infection) 10/10/2013  . TIA (transient ischemic attack) 10/10/2013  . HTN (hypertension) 07/18/2013  . 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 Neuralgia, neuritis, and radiculitis, unspecified 02/04/2012  . Migraine without aura, with intractable migraine, so stated, without mention of status migrainosus 02/04/2012  . 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 Myofascial muscle pain 07/15/2011    Rudell Cobb, PT, MPT 02/09/2014 8:47 AM [facility] Phone: [phone] Fax: [phone]   [place] 02/09/2014, 8:46 AM

## 2014-02-10 ENCOUNTER — Other Ambulatory Visit: Payer: Self-pay | Admitting: Family Medicine

## 2014-02-10 ENCOUNTER — Encounter: Payer: Self-pay | Admitting: Family Medicine

## 2014-02-10 ENCOUNTER — Ambulatory Visit (INDEPENDENT_AMBULATORY_CARE_PROVIDER_SITE_OTHER): Payer: PRIVATE HEALTH INSURANCE | Admitting: Family Medicine

## 2014-02-10 ENCOUNTER — Encounter: Payer: Self-pay | Admitting: Rehabilitative and Restorative Service Providers"

## 2014-02-10 VITALS — BP 119/77 | HR 63 | Temp 97.8°F | Ht 67.0 in | Wt 169.0 lb

## 2014-02-10 DIAGNOSIS — J069 Acute upper respiratory infection, unspecified: Secondary | ICD-10-CM

## 2014-02-10 NOTE — Patient Instructions (Signed)
It was a pleasure seeing you today in our clinic. Today we discussed your current sinus symptoms and ongoing neuro-rehab. Here is the treatment plan we have discussed and agreed upon together:   - Continue taking Tylenol 2 tablets up to 3 times a day, as needed. - Be cautious when taking any over-the-counter medications containing phenylalanine, or Benadryl. As these may elicit increased neurological symptoms you've been dealing with over the past few months. - If you begin to experience any worsening of symptoms or high-grade fevers do not hesitate to call our office, report to urgent care, or report to the emergency department for further workup. - Symptoms should resolve over the weekend, if they do not improve by early next week, fell free to call our office to leave me a message. And I will try to respond as soon as possible.

## 2014-02-10 NOTE — Progress Notes (Signed)
   HPI  Patient presents today for flulike symptoms. Patient states she has been experiencing flulike symptoms for approximately the past 7 days. Symptoms include low-grade fever, sinus pressure/congestion/drainage, nausea, diarrhea, and sore throat. Patient states that this illness has been progressing through her family over the past month. It started with her grandson who she believes brought it home from school. She denies any weakness vomiting vision changes headaches confusion appetite changes hematuria dysuria. Patient states she has been taking Tylenol cold and sinus prior to going to bed. She is also taken some NyQuil which has not been of much benefit. Patient states that her symptoms peaked yesterday but may be improving today.  No other complaints today.  Smoking status noted ROS: Per HPI  Objective: BP 119/77 mmHg  Pulse 63  Temp(Src) 97.8 F (36.6 C) (Oral)  Ht 5\' 7"  (1.702 m)  Wt 169 lb (76.658 kg)  BMI 26.46 kg/m2 Gen: NAD, alert, cooperative with exam HEENT: NCAT, EOMI, PERRL; edematous nasal mucosa; tympanic membranes benign; erythematous oropharynx CV: RRR, good S1/S2, no murmur Resp: CTABL, no wheezes, non-labored Abd: SNTND, BS present, no guarding or organomegaly Ext: No edema, warm Neuro: Alert and oriented, No gross deficits  Assessment and plan:  URI (upper respiratory infection) I suspect patient is suffering from viral URI versus flu. Symptoms appear to be primarily localized to her sinuses and throat. Only minor symptomatic complaints systemically. Viral etiology makes symptomatic resolution likely within the next 7 days.  - Patient urged to seek over-the-counter therapy for symptomatic relief. Patient urged not to use Motrin/ibuprofen secondary to her kidney dysfunction. Tylenol for fever control and discomfort. - Patient's history of renal issues as well as neurologic complaints requiring neuro rehabilitation makes me cautious to recommend any alpha  adrenergic agonists.  - Patient urged to call the office if she has any worsening of symptoms or if symptoms persist over the next 3-5 days.    No orders of the defined types were placed in this encounter.    No orders of the defined types were placed in this encounter.     Elberta Leatherwood, MD,MS,  PGY1 02/10/2014 2:30 PM

## 2014-02-10 NOTE — Assessment & Plan Note (Signed)
I suspect patient is suffering from viral URI versus flu. Symptoms appear to be primarily localized to her sinuses and throat. Only minor symptomatic complaints systemically. Viral etiology makes symptomatic resolution likely within the next 7 days.  - Patient urged to seek over-the-counter therapy for symptomatic relief. Patient urged not to use Motrin/ibuprofen secondary to her kidney dysfunction. Tylenol for fever control and discomfort. - Patient's history of renal issues as well as neurologic complaints requiring neuro rehabilitation makes me cautious to recommend any alpha adrenergic agonists.  - Patient urged to call the office if she has any worsening of symptoms or if symptoms persist over the next 3-5 days.

## 2014-02-13 ENCOUNTER — Ambulatory Visit: Payer: PRIVATE HEALTH INSURANCE | Admitting: Rehabilitative and Restorative Service Providers"

## 2014-02-13 ENCOUNTER — Encounter: Payer: Self-pay | Admitting: Rehabilitative and Restorative Service Providers"

## 2014-02-13 DIAGNOSIS — Z5189 Encounter for other specified aftercare: Secondary | ICD-10-CM | POA: Diagnosis not present

## 2014-02-13 DIAGNOSIS — R269 Unspecified abnormalities of gait and mobility: Secondary | ICD-10-CM

## 2014-02-13 DIAGNOSIS — H8111 Benign paroxysmal vertigo, right ear: Secondary | ICD-10-CM

## 2014-02-13 NOTE — Therapy (Signed)
[facility] [address] [place], [place], [address] Phone: [phone]   Fax:  [phone]  Physical Therapy Treatment  Patient Details  Name: [name] MRN: [id number] Date of Birth: 27-Sep-1950  Encounter Date: 02/13/2014      PT End of Session - 02/13/14 1158    Visit Number 3   Number of Visits 6   Date for PT Re-Evaluation 02/26/14   PT Start Time 0845   PT Stop Time 0935   PT Time Calculation (min) 50 min   Activity Tolerance Patient tolerated treatment well   Behavior During Therapy [facility] for tasks assessed/performed      Past Medical History  Diagnosis Date  . Hyperlipidemia   . Migraine headache   . GERD (gastroesophageal reflux disease)   . Occipital neuralgia     Left  . Chronic kidney disease     stage 3    Past Surgical History  Procedure Laterality Date  . Abdominal hysterectomy    . Knee surgery    . Carpal tunnel release    . Elbow surgery    . Lung surgery    . Bladder repair    . Cervical spine surgery      There were no vitals taken for this visit.  Visit Diagnosis:  BPPV (benign paroxysmal positional vertigo), right  Abnormality of gait      Subjective Assessment - 02/13/14 0854    Symptoms The patient saw MD on Friday.  She is scheduled to stay out of work until the first of the year.  She has not had any falls since begining P.T., however still has modified her activities to be more cautious to prevent falls.  The patient still notes imbalance and dizziness with looking up and bending forward.  She is not able to move quickly during daily tasks.                                                   Currently in Pain? Yes   Pain Score 5    Pain Location Head   Pain Orientation Right   Pain Type Chronic pain   Pain Onset More than a month ago   Pain Frequency Constant   Aggravating Factors  dizziness aggraates, hypersensitive to smells/lights/television   Pain Relieving Factors  topamax     NEUROMUSCULAR RE-EDUCATION: Gaze x 1 x 30 seconds x 3 repetitions with verbal cues to keep target in focus (standing feet apart). Standing 360 degree turns aggravate symptoms to moderate degree, therefore began with 180 degree turns adding eyes/head turning before body to use visual targets as a compensatory strategy to improve tolerance to activities x 5 repetitions. Corner standing exercises on foam with eyes closed, foam with head turns horizontal and vertical x 5 reps x 2 sets. Bilateral dix hallpike negative for nystagmus, + to the R for mild symptom of dizziness lasting x 3-4 seconds.  Reviewed brandt daroff for HEP removing with increased speed without a pillow x 5 reps to the R side without provoking any symptoms today.  Negative left sidelying.  Seated, then standing bending forward to reach ground as in picking up object x 5 reps with reports of no dizziness, but rather visual change that is "blurriness" with visual distortion.        PT Education - 02/13/14 1157  Education provided Yes   Education Details HEP: standing balance corner exercises, standing head turns, foam with eyes closed   Person(s) Educated Patient   Methods Explanation;Demonstration;Verbal cues;Handout   Comprehension Verbalized understanding;Returned demonstration          Plan - 02/13/14 0901    Clinical Impression Statement The patient reports HEP symptoms worsen with repetition, which could indicate that symptoms are not all related to BPPV at this time, as BPPV is fatiguable and symptoms should improve with repetitoion.   The patient is reporting significant improvement in overall dizziness and mobility. PT began balance activities for home to progress functional mobility.   PT Next Visit Plan Check Balance HEP, check for BPPV to reassess; begin d/c planning with focus on return to work   Consulted and Agree with Plan of Care Patient     Problem List Patient Active Problem List    Diagnosis Date Noted  . URI (upper respiratory infection) 02/10/2014  . Migraine 01/03/2014  . Dizziness 11/09/2013  . Adjustment disorder with mixed anxiety and depressed mood 11/03/2013  . Migraine with status migrainosus 10/26/2013  . Migraine variant 10/10/2013  . Disturbance of skin sensation 10/10/2013  . UTI (lower urinary tract infection) 10/10/2013  . TIA (transient ischemic attack) 10/10/2013  . HTN (hypertension) 07/18/2013  . 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 Neuralgia, neuritis, and radiculitis, unspecified 02/04/2012  . Migraine without aura, with intractable migraine, so stated, without mention of status migrainosus 02/04/2012  . 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 Myofascial muscle pain 07/15/2011     Rudell Cobb, PT, MPT 02/13/2014 12:03 PM [facility] Phone: [phone] Fax: [phone]  [place] 02/13/2014, 12:01 PM

## 2014-02-13 NOTE — Patient Instructions (Signed)
Continue the gaze x 1 viewing exercise.  *Try to keep the letter focused and go at an increased speed ONLY if it stays focused without blurring.  Continue the sit to sidelying exercise and rolling exercise until you have 2 days in a row without any symptoms.  Add the following exercises for balance: Feet Together, Varied Arm Positions - Eyes Closed Turning in Place: Compensatory Strategy   Standing in place, first move eyes and head to target at eye level located to right side. Keeping eyes on target, turn body toward target. Repeat sequence with target on each wall to complete half turn. Repeat _5___ times per session. Do _1-2___ sessions per day.  Copyright  VHI. All rights reserved.  Head Motion: Side to Side Head Motion: Up and Down   Standing on a pillow in the corner for safety and slowly move head up with eyes open.  Repeat __5-10_ times per session. Do __1-2__ sessions per day.  Copyright  VHI. All rights reserved.    Standing on a pillow in the corner for safety, tilt head down 15-30, slowly move head to right with eyes open and then to the left. Repeat _5-10___ times per session. Do _1-2___ sessions per day.  Copyright  VHI. All rights reserved.    Stand with feet together ON A PILLOW and arms out. Close eyes and visualize upright position. Hold __30__ seconds. Repeat __3__ times per session. Do __1-2__ sessions per day.  Copyright  VHI. All rights reserved.

## 2014-02-14 DIAGNOSIS — Z0271 Encounter for disability determination: Secondary | ICD-10-CM

## 2014-02-20 ENCOUNTER — Ambulatory Visit: Payer: PRIVATE HEALTH INSURANCE | Admitting: Rehabilitative and Restorative Service Providers"

## 2014-02-20 ENCOUNTER — Encounter: Payer: Self-pay | Admitting: Rehabilitative and Restorative Service Providers"

## 2014-02-20 DIAGNOSIS — Z5189 Encounter for other specified aftercare: Secondary | ICD-10-CM | POA: Diagnosis not present

## 2014-02-20 DIAGNOSIS — R269 Unspecified abnormalities of gait and mobility: Secondary | ICD-10-CM

## 2014-02-20 NOTE — Patient Instructions (Signed)
Bending / Picking Up Objects   STANDING, slowly bend head down and pick up object on the floor ([place]). Return to upright position. Hold position until symptoms subside. Repeat _5___ times per session. Do __1-2__ sessions per day. *You should not feel like you are getting worsening lightheadedness/or feel like you are going to pass out with this activity.  If so, stop exercise immediately.  Copyright  VHI. All rights reserved.

## 2014-02-20 NOTE — Therapy (Signed)
[facility] [address] [place], [place], [address] Phone: [phone]   Fax:  [phone]  Physical Therapy Treatment  Patient Details  Name: [name] MRN: [id number] Date of Birth: [birth year]  Encounter Date: 02/20/2014      PT End of Session - 02/20/14 0929    Visit Number 4   Number of Visits 6   Date for PT Re-Evaluation 02/26/14   PT Start Time 0850   PT Stop Time 0930   PT Time Calculation (min) 40 min   Activity Tolerance Patient tolerated treatment well   Behavior During Therapy [facility] for tasks assessed/performed      Past Medical History  Diagnosis Date  . Hyperlipidemia   . Migraine headache   . GERD (gastroesophageal reflux disease)   . Occipital neuralgia     Left  . Chronic kidney disease     stage 3    Past Surgical History  Procedure Laterality Date  . Abdominal hysterectomy    . Knee surgery    . Carpal tunnel release    . Elbow surgery    . Lung surgery    . Bladder repair    . Cervical spine surgery      There were no vitals taken for this visit.  Visit Diagnosis:  Abnormality of gait      Subjective Assessment - 02/20/14 0852    Symptoms The patient is no longer needing rolling exercise b/c it is not making her dizzy.  She notes continued dizziness with bending forward, but notes intensity has diminished slightly.  She is no longer dizzy getting into/outof bed.  She also reports a "foggy" sensation and describes "hard to focus".  She also notes a constant headache that is at best 5/10.  She is doing Balance HEP provided last session.   Currently in Pain? Yes   Pain Score 6    Pain Location Head   Pain Orientation Right   Pain Type Chronic pain   Pain Onset More than a month ago   Aggravating Factors  change in humidity has aggravated this week   Pain Relieving Factors topamax     NEUROMUSCULAR RE-EDUCATION: Reviewed sit<>bilateral sidelying without any reports of dizziness.    Standing 360 degree turns provoke 4 out of 10 symptoms of dizziness and nausea.  PT had her rest in between repetition.  Performed 3 times, symptoms increased to > 5/10, so rested in sitting position. Standing habituation with diagonal bending towards floor without dizziness L down/R up without increasing dizziness.  Worse symptoms with R down/ Lup. Reviewed balance HEP. The patient performed corner standing on foam with eyes closed with feet apart then together.  Gait: Ambulation on level surfaces with emphasis on arm swing and longer stride length.   Community/unlevel surface negotiation independently without loss of balance >100 ft and up/down curbs independently.        PT Education - 02/20/14 [phone]    Education provided Yes   Education Details HEP: habituation bending and return to standing.   Person(s) Educated Patient   Methods Explanation;Demonstration;Handout   Comprehension Verbalized understanding;Returned demonstration            PT Long Term Goals - 02/20/14 0945    PT LONG TERM GOAL #1   Title The patient will return demo HEP for gaze, habituation and balance as indicated.   Baseline on 02/20/2014   Status Achieved   PT LONG TERM GOAL #2   Title The patient will  have negative positional testing for BPPV.   Baseline on 02/20/14   Status Achieved   PT LONG TERM GOAL #3   Title The patient will have decreased [place] < or equal to 45% to demo improved subjective dizziness.   Baseline 02/26/2014   Status On-going   PT LONG TERM GOAL #4   Title PT to further assess balance and address via HEP due to high # of reported falls.   Baseline on 02/20/14.  Pt has decreased falls since beginning P.T.   Status Achieved          Plan - 02/20/14 0942    Clinical Impression Statement The patient reports resolution of dizziness with bed mobility and in morning upon waking.  She is still c/o HA, "foggy" sensation and occasional, intermittent dizziness worse with bending  forward.  PT recommends she continue current HEP and increase home walking and home chore activity (as long as it feels safe and does not worsen HA) in preparation for goal of returning to work in January.                                                             PT Next Visit Plan check HEP and goals   Consulted and Agree with Plan of Care Patient               Vestibular Assessment - 02/20/14 0900    BP supine (x 5 minutes) 142/88 mmHg   HR supine (x 5 minutes) 75   BP standing (after 1 minute) 136/86 mmHg   HR standing (after 1 minute) 89   BP standing (after 3 minutes) 124/88 mmHg   HR standing (after 3 minutes) 86   Orthostatics Comment --  lightheadedness upon standing that improves over time                        Problem List Patient Active Problem List   Diagnosis Date Noted  . URI (upper respiratory infection) 02/10/2014  . Migraine 01/03/2014  . Dizziness 11/09/2013  . Adjustment disorder with mixed anxiety and depressed mood 11/03/2013  . Migraine with status migrainosus 10/26/2013  . Migraine variant 10/10/2013  . Disturbance of skin sensation 10/10/2013  . UTI (lower urinary tract infection) 10/10/2013  . TIA (transient ischemic attack) 10/10/2013  . HTN (hypertension) 07/18/2013  . 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 Neuralgia, neuritis, and radiculitis, unspecified 02/04/2012  . Migraine without aura, with intractable migraine, so stated, without mention of status migrainosus 02/04/2012  . 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 Myofascial muscle pain 07/15/2011    Laneah Luft 02/20/2014, 9:48 AM

## 2014-02-20 NOTE — Therapy (Deleted)
[facility] [address] [place], [place], [address] Phone: [phone]   Fax:  [phone]  Physical Therapy Treatment  Patient Details  Name: [name] MRN: [id number] Date of Birth: 1950-04-23  Encounter Date: 02/20/2014      PT End of Session - 02/20/14 0929    Visit Number 4   Number of Visits 6   Date for PT Re-Evaluation 02/26/14   PT Start Time 0850   PT Stop Time 0930   PT Time Calculation (min) 40 min   Activity Tolerance Patient tolerated treatment well   Behavior During Therapy [facility] for tasks assessed/performed      Past Medical History  Diagnosis Date  . Hyperlipidemia   . Migraine headache   . GERD (gastroesophageal reflux disease)   . Occipital neuralgia     Left  . Chronic kidney disease     stage 3    Past Surgical History  Procedure Laterality Date  . Abdominal hysterectomy    . Knee surgery    . Carpal tunnel release    . Elbow surgery    . Lung surgery    . Bladder repair    . Cervical spine surgery      There were no vitals taken for this visit.  Visit Diagnosis:  No diagnosis found.      Subjective Assessment - 02/20/14 0852    Symptoms The patient is no longer needing rolling exercise b/c it is not making her dizzy.  She notes continued dizziness with bending forward, but notes intensity has diminished slightly.  She is no longer dizzy getting into/outof bed.  She also reports a "foggy" sensation and describes "hard to focus".  She also notes a constant headache that is at best 5/10.  She is doing Balance HEP provided last session.   Currently in Pain? Yes   Pain Score 6    Pain Location Head   Pain Orientation Right   Pain Type Chronic pain   Pain Onset More than a month ago   Aggravating Factors  change in humidity has aggravated this week   Pain Relieving Factors topamax     NEUROMUSCULAR RE-EDUCATION: Reviewed sit<>bilateral sidelying without any reports of dizziness.    Standing 360 degree turns provoke 4 out of 10 symptoms of dizziness and nausea.  PT had her rest in between repetition.  Performed 3 times, symptoms increased to > 5/10, so rested in sitting position. Standing habituation with diagonal bending towards floor without dizziness L down/R up without increasing dizziness.  Worse symptoms with R down/ Lup. Reviewed balance HEP. The patient performed corner standing on foam with eyes closed with feet apart then together.  Gait: Ambulation on level surfaces with emphasis on arm swing and longer stride length.   Community/unlevel surface negotiation independently without loss of balance >100 ft and up/down curbs independently.        Vestibular Assessment - 02/20/14 0900    BP supine (x 5 minutes) 142/88 mmHg   HR supine (x 5 minutes) 75   BP standing (after 1 minute) 136/86 mmHg   HR standing (after 1 minute) 89   BP standing (after 3 minutes) 124/88 mmHg   HR standing (after 3 minutes) 86   Orthostatics Comment --  lightheadedness upon standing that improves over time                        Problem List Patient Active Problem List  Diagnosis Date Noted  . URI (upper respiratory infection) 02/10/2014  . Migraine 01/03/2014  . Dizziness 11/09/2013  . Adjustment disorder with mixed anxiety and depressed mood 11/03/2013  . Migraine with status migrainosus 10/26/2013  . Migraine variant 10/10/2013  . Disturbance of skin sensation 10/10/2013  . UTI (lower urinary tract infection) 10/10/2013  . TIA (transient ischemic attack) 10/10/2013  . HTN (hypertension) 07/18/2013  . 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 Neuralgia, neuritis, and radiculitis, unspecified 02/04/2012  . Migraine without aura, with intractable migraine, so stated, without mention of status migrainosus 02/04/2012  . 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 Myofascial muscle pain 07/15/2011    Faith Branan 02/20/2014, 9:42 AM

## 2014-02-27 ENCOUNTER — Ambulatory Visit: Payer: PRIVATE HEALTH INSURANCE | Admitting: Rehabilitative and Restorative Service Providers"

## 2014-02-27 DIAGNOSIS — Z5189 Encounter for other specified aftercare: Secondary | ICD-10-CM | POA: Diagnosis not present

## 2014-02-27 DIAGNOSIS — H8111 Benign paroxysmal vertigo, right ear: Secondary | ICD-10-CM

## 2014-02-27 NOTE — Therapy (Signed)
[facility] [address] [place], [place], [address] Phone: [phone]   Fax:  [phone]  Physical Therapy Treatment  Patient Details  Name: [name] MRN: [id number] Date of Birth: May 26, 1950  Encounter Date: 02/27/2014      PT End of Session - 02/27/14 0831    Visit Number 5   Number of Visits 6   Date for PT Re-Evaluation 02/26/14   PT Start Time 0805   PT Stop Time 0840   PT Time Calculation (min) 35 min   Activity Tolerance Patient tolerated treatment well   Behavior During Therapy [facility] for tasks assessed/performed      Past Medical History  Diagnosis Date  . Hyperlipidemia   . Migraine headache   . GERD (gastroesophageal reflux disease)   . Occipital neuralgia     Left  . Chronic kidney disease     stage 3    Past Surgical History  Procedure Laterality Date  . Abdominal hysterectomy    . Knee surgery    . Carpal tunnel release    . Elbow surgery    . Lung surgery    . Bladder repair    . Cervical spine surgery      There were no vitals taken for this visit.  Visit Diagnosis:  BPPV (benign paroxysmal positional vertigo), right      Subjective Assessment - 02/27/14 0807    Symptoms The patient reports dizziness this morning. She also noted dizziness over the weekend and lost her balance (had to catch herself) when getting out of the shower.  The patient is walking more.  The headache is always there.  It is always a 5/10.  She called her MD office and the only recommendation would be stronger meds and she doesn't feel that would be helpful due to how it would make her feel.  She also doesn't feel she would be able to drive on stronger medications.  The patient is still planning on returning to work in January.    Currently in Pain? Yes   Pain Score 5    Pain Location Head   Pain Orientation Right   Pain Descriptors / Indicators Constant   Pain Type Chronic pain   Pain Onset More  than a month ago   Pain Frequency Constant   Aggravating Factors  change in humidity and weather seem to aggravate symtpoms.   Multiple Pain Sites No     NEUROMUSCULAR RE-EDUCATION: Negative L sit>sidelying for nystagmus or dizziness. Mild sensation of dizziness with R sit>sidelying described as "little whirl".   R dix hallpike + for subjective reports of mild dizziness. L dix hallpike (-) for dizziness or nystagmus.  Gait: DGI=23/24 Gait speed 3.79 ft/sec  SELF CARE/HOME MANAGEMENT: Recommended return to sit>R sidelying exercise.  The patient verbalizes that she still has paper for that exercise.  This will be for self management in the future for recurring BPPV.         PT Long Term Goals - 02/27/14 0834    PT LONG TERM GOAL #1   Title The patient will return demo HEP for gaze, habituation and balance as indicated.   Baseline on 02/20/2014   Status Achieved   PT LONG TERM GOAL #2   Title The patient will have negative positional testing for BPPV.   Baseline on 02/20/14   Status Achieved   PT LONG TERM GOAL #3   Title The patient will have decreased [place] < or equal  to 45% to demo improved subjective dizziness.   Baseline met on 02/27/2014  Pt improved from 68% on DHI down to 36%.     Status Achieved   PT LONG TERM GOAL #4   Title PT to further assess balance and address via HEP due to high # of reported falls.   Baseline on 02/20/14.  Pt has decreased falls since beginning P.T.   Status Achieved           Plan - 02/27/14 0831    Clinical Impression Statement The patient has met all LTGs in P.T.  She presented today with mild report of sensation of dizziness today and PT reviewed home management. No nystagmus were viewed in room light, however minimal sensation of dizziness noted with R dix hallpike and R sidelying test.  The patient verbalizes that she feels she can continue to perform to work on HEP after d/c.  She inquires further about treatment for headache.  PT  explained that dizziness can make headache feel worse, however further management of HA may occur from MD vs. therapy due to nature of constant headache.   PT Next Visit Plan Discharge today.   Consulted and Agree with Plan of Care Patient        Problem List Patient Active Problem List   Diagnosis Date Noted  . URI (upper respiratory infection) 02/10/2014  . Migraine 01/03/2014  . Dizziness 11/09/2013  . Adjustment disorder with mixed anxiety and depressed mood 11/03/2013  . Migraine with status migrainosus 10/26/2013  . Migraine variant 10/10/2013  . Disturbance of skin sensation 10/10/2013  . UTI (lower urinary tract infection) 10/10/2013  . TIA (transient ischemic attack) 10/10/2013  . HTN (hypertension) 07/18/2013  . 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 Neuralgia, neuritis, and radiculitis, unspecified 02/04/2012  . Migraine without aura, with intractable migraine, so stated, without mention of status migrainosus 02/04/2012  . 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 Myofascial muscle pain 07/15/2011     PHYSICAL THERAPY DISCHARGE SUMMARY  Visits from Start of Care: 5  Current functional level related to goals / functional outcomes: See goals above for patient status.   Remaining deficits: Headache on right side, intermittent dizziness, occasional loss of balance.  Pt has exercises for home management of dizziness and balance training.   Education / Equipment: HEP and home walking program.  Plan: Patient agrees to discharge.  Patient goals were met. Patient is being discharged due to meeting the stated rehab goals.  ?????  Thank you for the referral of this patient.     Shalise Rosado 02/27/2014, 8:43 AM  [facility] [address] [place], [place], [address] Phone: [phone]   Fax:  [phone]

## 2014-03-06 ENCOUNTER — Other Ambulatory Visit: Payer: Self-pay | Admitting: Adult Health

## 2014-03-06 ENCOUNTER — Encounter: Payer: Self-pay | Admitting: Family Medicine

## 2014-03-06 ENCOUNTER — Ambulatory Visit (INDEPENDENT_AMBULATORY_CARE_PROVIDER_SITE_OTHER): Payer: PRIVATE HEALTH INSURANCE | Admitting: Family Medicine

## 2014-03-06 VITALS — BP 144/93 | HR 103 | Temp 98.5°F | Ht 67.0 in | Wt 171.4 lb

## 2014-03-06 DIAGNOSIS — J069 Acute upper respiratory infection, unspecified: Secondary | ICD-10-CM

## 2014-03-06 MED ORDER — GUAIFENESIN ER 600 MG PO TB12: 600.0000 mg | ORAL_TABLET | Freq: Two times a day (BID) | ORAL | Status: DC | PRN

## 2014-03-06 NOTE — Patient Instructions (Signed)
I think you have a virus Use guaifenesin 600mg  ER tablets - I sent in prescription Also use neti pot with sterile water Use flonase daily  Follow up if not getting better in the next 3-4 days or if you start to feel a lot worse. Be well, Dr. Ardelia Mems    Upper Respiratory Infection, Adult An upper respiratory infection (URI) is also sometimes known as the common cold. The upper respiratory tract includes the nose, sinuses, throat, trachea, and bronchi. Bronchi are the airways leading to the lungs. Most people improve within 1 week, but symptoms can last up to 2 weeks. A residual cough may last even longer.  CAUSES Many different viruses can infect the tissues lining the upper respiratory tract. The tissues become irritated and inflamed and often become very moist. Mucus production is also common. A cold is contagious. You can easily spread the virus to others by oral contact. This includes kissing, sharing a glass, coughing, or sneezing. Touching your mouth or nose and then touching a surface, which is then touched by another person, can also spread the virus. SYMPTOMS  Symptoms typically develop 1 to 3 days after you come in contact with a cold virus. Symptoms vary from person to person. They may include:  Runny nose.  Sneezing.  Nasal congestion.  Sinus irritation.  Sore throat.  Loss of voice (laryngitis).  Cough.  Fatigue.  Muscle aches.  Loss of appetite.  Headache.  Low-grade fever. DIAGNOSIS  You might diagnose your own cold based on familiar symptoms, since most people get a cold 2 to 3 times a year. Your caregiver can confirm this based on your exam. Most importantly, your caregiver can check that your symptoms are not due to another disease such as strep throat, sinusitis, pneumonia, asthma, or epiglottitis. Blood tests, throat tests, and X-rays are not necessary to diagnose a common cold, but they may sometimes be helpful in excluding other more serious diseases.  Your caregiver will decide if any further tests are required. RISKS AND COMPLICATIONS  You may be at risk for a more severe case of the common cold if you smoke cigarettes, have chronic heart disease (such as heart failure) or lung disease (such as asthma), or if you have a weakened immune system. The very young and very old are also at risk for more serious infections. Bacterial sinusitis, middle ear infections, and bacterial pneumonia can complicate the common cold. The common cold can worsen asthma and chronic obstructive pulmonary disease (COPD). Sometimes, these complications can require emergency medical care and may be life-threatening. PREVENTION  The best way to protect against getting a cold is to practice good hygiene. Avoid oral or hand contact with people with cold symptoms. Wash your hands often if contact occurs. There is no clear evidence that vitamin C, vitamin E, echinacea, or exercise reduces the chance of developing a cold. However, it is always recommended to get plenty of rest and practice good nutrition. TREATMENT  Treatment is directed at relieving symptoms. There is no cure. Antibiotics are not effective, because the infection is caused by a virus, not by bacteria. Treatment may include:  Increased fluid intake. Sports drinks offer valuable electrolytes, sugars, and fluids.  Breathing heated mist or steam (vaporizer or shower).  Eating chicken soup or other clear broths, and maintaining good nutrition.  Getting plenty of rest.  Using gargles or lozenges for comfort.  Controlling fevers with ibuprofen or acetaminophen as directed by your caregiver.  Increasing usage of your inhaler if  you have asthma. Zinc gel and zinc lozenges, taken in the first 24 hours of the common cold, can shorten the duration and lessen the severity of symptoms. Pain medicines may help with fever, muscle aches, and throat pain. A variety of non-prescription medicines are available to treat  congestion and runny nose. Your caregiver can make recommendations and may suggest nasal or lung inhalers for other symptoms.  HOME CARE INSTRUCTIONS   Only take over-the-counter or prescription medicines for pain, discomfort, or fever as directed by your caregiver.  Use a warm mist humidifier or inhale steam from a shower to increase air moisture. This may keep secretions moist and make it easier to breathe.  Drink enough water and fluids to keep your urine clear or pale yellow.  Rest as needed.  Return to work when your temperature has returned to normal or as your caregiver advises. You may need to stay home longer to avoid infecting others. You can also use a face mask and careful hand washing to prevent spread of the virus. SEEK MEDICAL CARE IF:   After the first few days, you feel you are getting worse rather than better.  You need your caregiver's advice about medicines to control symptoms.  You develop chills, worsening shortness of breath, or brown or red sputum. These may be signs of pneumonia.  You develop yellow or brown nasal discharge or pain in the face, especially when you bend forward. These may be signs of sinusitis.  You develop a fever, swollen neck glands, pain with swallowing, or white areas in the back of your throat. These may be signs of strep throat. SEEK IMMEDIATE MEDICAL CARE IF:   You have a fever.  You develop severe or persistent headache, ear pain, sinus pain, or chest pain.  You develop wheezing, a prolonged cough, cough up blood, or have a change in your usual mucus (if you have chronic lung disease).  You develop sore muscles or a stiff neck. Document Released: 08/20/2000 Document Revised: 05/19/2011 Document Reviewed: 06/01/2013 [facility] Patient Information 2015 [place], [place]. This information is not intended to replace advice given to you by your health care provider. Make sure you discuss any questions you have with your health care  provider.

## 2014-03-10 NOTE — Progress Notes (Signed)
Patient ID: [name], female   DOB: 30-Nov-1950, 64 y.o.   MRN: [id number]  HPI:  Pt presents for a same day appointment to discuss URI sx's.  Pt states that she's been feeling a little sick for about a week, but two days ago got worse. Began having sinus pressure, sore throat, ear pain, cough, and runny nose. Ran fever to 100-101 at night. Has taken tylenol cold, which has helped a little. Eating and drinking well.    ROS: See HPI  [place]: hx HTN, TIA, migraines  PHYSICAL EXAM: BP 144/93 mmHg  Pulse 103  Temp(Src) 98.5 F (36.9 C) (Oral)  Ht 5\' 7"  (1.702 m)  Wt 171 lb 6.4 oz (77.747 kg)  BMI 26.84 kg/m2 Gen: NAD HEENT: NCAT, oropharynx clear and moist without exudate, TM's clear bilat, nares patent, no anterior cervical LAD Heart: RRR Lungs: CTAB, NWOB Neuro: grossly nonfocal speech normal  ASSESSMENT/PLAN:  1. Viral URI: acutely worsened 2 days ago. No findings on exam to suggest bacterial superinfection at this time. Recommend mucinex, neti pot, flonase. Follow up if not improving in next 3-4 days for re-evaluation to determine if has progressed or if pt needs abx.  FOLLOW UP: F/u as needed if symptoms worsen or do not improve.   [place]. Ardelia Mems, [place]

## 2014-03-13 ENCOUNTER — Telehealth: Payer: Self-pay | Admitting: Family Medicine

## 2014-03-13 DIAGNOSIS — R42 Dizziness and giddiness: Secondary | ICD-10-CM

## 2014-03-13 NOTE — Telephone Encounter (Signed)
Called patient to discuss her current state. Patient states that she is made significant progress with neuro rehabilitation but had discussed with her physical therapist that she would benefit from additional time with neuro rehabilitation. After reviewing the notes placed by her physical therapist I deem that it is appropriate for her to continue rehabilitation.  New referral is been placed. I look forward to seeing the patient at the end of the month.

## 2014-03-13 NOTE — Telephone Encounter (Signed)
Patient needs a new referral to outpatient PT-neurorehab, current one has expired. Will forward to PCP to place referral.

## 2014-03-13 NOTE — Telephone Encounter (Signed)
Needs dr to send authorization to continue therapy [facility] on [address]--- Sees [facility]

## 2014-03-29 ENCOUNTER — Ambulatory Visit: Payer: No Typology Code available for payment source | Admitting: Rehabilitative and Restorative Service Providers"

## 2014-04-01 ENCOUNTER — Other Ambulatory Visit: Payer: Self-pay | Admitting: Neurology

## 2014-04-03 ENCOUNTER — Ambulatory Visit: Payer: No Typology Code available for payment source | Attending: Family Medicine | Admitting: Physical Therapy

## 2014-04-03 ENCOUNTER — Encounter: Payer: Self-pay | Admitting: Physical Therapy

## 2014-04-03 DIAGNOSIS — H8111 Benign paroxysmal vertigo, right ear: Secondary | ICD-10-CM | POA: Insufficient documentation

## 2014-04-03 DIAGNOSIS — Z5189 Encounter for other specified aftercare: Secondary | ICD-10-CM | POA: Insufficient documentation

## 2014-04-03 DIAGNOSIS — R269 Unspecified abnormalities of gait and mobility: Secondary | ICD-10-CM | POA: Diagnosis not present

## 2014-04-03 DIAGNOSIS — R42 Dizziness and giddiness: Secondary | ICD-10-CM

## 2014-04-03 NOTE — Therapy (Signed)
[facility] [address] [place], [place], [address] Phone: [phone]   Fax:  [phone]  Physical Therapy Evaluation  Patient Details  Name: [name] MRN: [id number] Date of Birth: August 19, 1950 Referring Provider:  Elberta Leatherwood, MD  Encounter Date: 04/03/2014      PT End of Session - 04/03/14 1606    Visit Number 1   Number of Visits 13   Date for PT Re-Evaluation 05/04/14   Authorization Type [employer]   PT Start Time 1450   PT Stop Time 7408   PT Time Calculation (min) 55 min      Past Medical History  Diagnosis Date  . Hyperlipidemia   . Migraine headache   . GERD (gastroesophageal reflux disease)   . Occipital neuralgia     Left  . Chronic kidney disease     stage 3    Past Surgical History  Procedure Laterality Date  . Abdominal hysterectomy    . Knee surgery    . Carpal tunnel release    . Elbow surgery    . Lung surgery    . Bladder repair    . Cervical spine surgery      There were no vitals taken for this visit.  Visit Diagnosis:  Dizziness and giddiness      Subjective Assessment - 04/03/14 1552    Symptoms Pt. reports most recent migraine was 03-19-14; pt. reports she continues to have dizziness with bending over and with sit to stand transfers; pt. states she keeps a constant headache - intensity 5/10; states vertigo is not constant, but it "comes out of nowhere'   Currently in Pain? Yes   Pain Score 5    Pain Location Head   Pain Orientation Right          [facility] PT Assessment - 04/03/14 1505    Assessment   Medical Diagnosis Dizziness   Onset Date 10/10/13   Prior Therapy Nov. - Dec. 2015   Precautions   Precautions Fall   Precaution Comments worse with turns   Balance Screen   Has the patient fallen in the past 6 months Yes   How many times? 6  most recent 3 weeks ago in shower   Has the patient had a decrease in activity level because of a fear of falling?   Yes   Is the patient reluctant to leave their home because of a fear of falling?  No   Home Environment   Living Enviornment Private residence   Living Arrangements Spouse/significant other   Available Help at Discharge Family   Type of [place] to enter  3   Entrance Stairs-Rails Left  descending   [place] One level   Prior Function   Level of Independence Independent with basic ADLs  indep with IADLs, working, etc   Vocation Unemployed            Vestibular Assessment - 04/03/14 1515    Type of Dizziness "Funny feeling in head"   Frequency of Dizziness varies- intermittent   Duration of Dizziness lasts seconds   Aggravating Factors Sit to stand  bending over   Relieving Factors Head stationary   Smooth Pursuits --  nystagmus noted with horizontal and vertical with c/o vertig   Saccades Intact   VOR Cancellation Normal   Static line 9   Dynamic 1 line difference (line 8)  provoked dizziness   BP supine (x 5  minutes) 114/75 mmHg   HR supine (x 5 minutes) 91   BP standing (after 1 minute) 100/69 mmHg   HR standing (after 1 minute) 98      No signs/symptoms of BPPV noted during initial evaluation today;  Dizziness/vertigo appears to be of central vestibular Etiology - multi-factorial in etiology                PT Education - 04/03/14 1555    Education provided Yes   Education Details continue with gaze stabilization exercise   Person(s) Educated Patient   Methods Explanation;Demonstration   Comprehension Verbalized understanding;Returned demonstration          PT Short Term Goals - 04/03/14 1614    PT SHORT TERM GOAL #1   Title Improve DHI to </= 40% to demo improvement in vertigo   Baseline 50/100    target date 05-04-14   Time 4   Period Weeks   Status New   PT SHORT TERM GOAL #2   Title Complete SOT - increase composite score by 10 points - to demo improvement in balance.   Baseline target date 05-04-14   Time 4    Period Weeks   Status New   PT SHORT TERM GOAL #3   Title Independent in HEP for balance and gaze stabilization   Baseline target date 05-04-14   Time 4   Period Weeks   Status New   PT SHORT TERM GOAL #4   Title Report at least 25% improvement in vertigo with sit to stand and with supine to sit.   Baseline target date 05-04-14   Time 4   Period Weeks   Status New           PT Long Term Goals - 04/03/14 1619    PT LONG TERM GOAL #1   Title Increase SOT score by at least 20 points for composite score to demo improved balance.   Baseline target date 06-04-14   Time 8   Period Weeks   Status New   PT LONG TERM GOAL #2   Title Improve DHI score to </= 25/100 to demo improvement in vertigo   Baseline target date 06-04-14   Time 8   Period Weeks   Status New   PT LONG TERM GOAL #3   Title Report at least 50% improvement in vertigo   Baseline target date 06-04-14   Time 8   Period Weeks   Status New   PT LONG TERM GOAL #4   Title Independent in updated HEP as appropriate    Baseline target date 06-04-14   Time 8   Period Weeks   Status New               Plan - 04/03/14 1607    Clinical Impression Statement Pt. presents with c/o dizziness which appears to be related to central vestibular system etiology; no signs/symptoms of BPPV at this time; pt. has abnormal nystagmus with smooth pursuit testing, possibly related  to previous TIA's; symptoms consistent with orthostatic hypotension but BP readings are not consistent with this  problem   Pt will benefit from skilled therapeutic intervention in order to improve on the following deficits Abnormal gait;Decreased balance;Other (comment)  decreased vestibular function   Rehab Potential Good   PT Frequency 2x / week   PT Duration Other (comment)  1x/week for 2nd 4 weeks of PT   PT Treatment/Interventions Therapeutic activities;Patient/family education;Therapeutic exercise;Gait training;Balance training;Neuromuscular  re-education;Stair training   PT  Next Visit Plan do SOT; review HEP as appropriate   PT Home Exercise Plan gaze stabilization; balance on foam   Consulted and Agree with Plan of Care Patient         Problem List Patient Active Problem List   Diagnosis Date Noted  . URI (upper respiratory infection) 02/10/2014  . Migraine 01/03/2014  . Dizziness 11/09/2013  . Adjustment disorder with mixed anxiety and depressed mood 11/03/2013  . Migraine with status migrainosus 10/26/2013  . Migraine variant 10/10/2013  . Disturbance of skin sensation 10/10/2013  . UTI (lower urinary tract infection) 10/10/2013  . TIA (transient ischemic attack) 10/10/2013  . HTN (hypertension) 07/18/2013  . 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 Neuralgia, neuritis, and radiculitis, unspecified 02/04/2012  . Migraine without aura, with intractable migraine, so stated, without mention of status migrainosus 02/04/2012  . 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 Myofascial muscle pain 07/15/2011    Alda Lea, PT 04/03/2014, 5:43 PM  [facility] [address] [place], [place], [address] Phone: [phone]   Fax:  [phone]   Physician: Dr. Georges Lynch    Physician Documentation Your signature is required to indicate approval of the treatment plan as stated above.  Please sign and either send electronically or make a copy of this report for your files and return this physician signed original.  Please mark one 1.__approve of plan   2. ___approve of plan with the followingconditions. ____________________________________________________________________________________________________________________________________________   ______________________                                                       _____________________ Physician Signature                                                                     Date    Faxed to MD  for signature

## 2014-04-07 ENCOUNTER — Ambulatory Visit (INDEPENDENT_AMBULATORY_CARE_PROVIDER_SITE_OTHER): Payer: No Typology Code available for payment source | Admitting: Family Medicine

## 2014-04-07 ENCOUNTER — Encounter: Payer: Self-pay | Admitting: Family Medicine

## 2014-04-07 VITALS — BP 105/67 | HR 101 | Temp 98.4°F | Ht 67.0 in | Wt 167.2 lb

## 2014-04-07 DIAGNOSIS — R42 Dizziness and giddiness: Secondary | ICD-10-CM

## 2014-04-07 NOTE — Assessment & Plan Note (Signed)
Patient appears to be much more motivated these days with a desire to have a more active lifestyle than she had at our introduction. Neuro rehabilitation has been significantly helpful in this healing process.  Patient and I discussed the fact that she will not be able to be going to neuro rehabilitation forever and that she needs to seek out methods in which she can continue this healing process on an outpatient basis. I recommended to her seeking out a introductory course in yoga. It is my belief that yoga may present a controlled environment in which she may progress through her actions slowly and controlled. It may also allow her to test herself and push her own limits in a slow and controlled fashion.  Patient was very responsive to this recommendation and stated her desire to find a class she would be comfortable attending.

## 2014-04-07 NOTE — Patient Instructions (Signed)
It was a pleasure seeing you today in our clinic. Today we discussed your continued vertigo/dizziness and completion of your work status sheet. Here is the treatment plan we have discussed and agreed upon together:   - The continued news of your progression through neuro rehabilitation is exciting! You deserve full credit for the work you put in towards your rehabilitation. Continue the good work and the progression. Continue working through any setbacks as you now know, these setbacks are only temporary. - I would like to see you actively pursue a form of strengthening exercises in a controlled setting. Today recommended a beginners course in yoga. This is only a recommendation and if you do not feel comfortable doing this you do not have to I believe that this may prove to be a good substitute for your neuro rehabilitation later on in your healing process. - I would like to see you back in 4-6 weeks to go over your status before returning to work with anticipated date of 05/22/2014.

## 2014-04-07 NOTE — Progress Notes (Signed)
   HPI  Patient presents today for follow-up on neuro rehabilitation, and filling out insurance forms. Patient states she has been progressing well with her continued neuro rehabilitation. She reports some setbacks and continued symptoms specifically with quick motions of her head. Patient has been tolerating these episodes well. Episodes have become less and less frequent. Patient states that she did have some setbacks early on but she feels as though she has grown passed them and feels confident that she will continue progressing in the right direction.  On a different note patient also stated some frustrations at home with her husband. She referred to him as an example of losing hope and motivation in life. It appears as though patient's husband has become a "hermit" and patient has become frustrated with this. Patients husband is also on disability but patient states that he "will always be on disability". There appears to be some tension between patient and her husband at this time.  Smoking status noted ROS: Per HPI  Objective: BP 105/67 mmHg  Pulse 101  Temp(Src) 98.4 F (36.9 C) (Oral)  Ht 5\' 7"  (1.702 m)  Wt 167 lb 3 oz (75.836 kg)  BMI 26.18 kg/m2 Gen: NAD, alert, cooperative with exam HEENT: NCAT, EOMI, PERRL CV: RRR, good S1/S2, no murmur Resp: CTABL, no wheezes, non-labored Ext: No edema, warm Neuro: Alert and oriented, No gross deficits, no weakness (although patient reports feeling weaker on her right side) or diminished sensation appreciated bilaterally. Reflexes +2  Assessment and plan:  Dizziness Patient appears to be much more motivated these days with a desire to have a more active lifestyle than she had at our introduction. Neuro rehabilitation has been significantly helpful in this healing process.  Patient and I discussed the fact that she will not be able to be going to neuro rehabilitation forever and that she needs to seek out methods in which she can continue  this healing process on an outpatient basis. I recommended to her seeking out a introductory course in yoga. It is my belief that yoga may present a controlled environment in which she may progress through her actions slowly and controlled. It may also allow her to test herself and push her own limits in a slow and controlled fashion.  Patient was very responsive to this recommendation and stated her desire to find a class she would be comfortable attending.     No orders of the defined types were placed in this encounter.    No orders of the defined types were placed in this encounter.     Elberta Leatherwood, MD,MS,  PGY1 04/07/2014 6:41 PM

## 2014-04-10 ENCOUNTER — Ambulatory Visit: Payer: PRIVATE HEALTH INSURANCE | Attending: Family Medicine | Admitting: Physical Therapy

## 2014-04-10 ENCOUNTER — Encounter: Payer: Self-pay | Admitting: Physical Therapy

## 2014-04-10 DIAGNOSIS — R269 Unspecified abnormalities of gait and mobility: Secondary | ICD-10-CM

## 2014-04-10 DIAGNOSIS — Z5189 Encounter for other specified aftercare: Secondary | ICD-10-CM | POA: Insufficient documentation

## 2014-04-10 DIAGNOSIS — R42 Dizziness and giddiness: Secondary | ICD-10-CM

## 2014-04-10 DIAGNOSIS — H8111 Benign paroxysmal vertigo, right ear: Secondary | ICD-10-CM | POA: Diagnosis not present

## 2014-04-10 NOTE — Therapy (Signed)
[facility] [address] [place], [place], [address] Phone: [phone]   Fax:  [phone]  Physical Therapy Treatment  Patient Details  Name: [name] MRN: [id number] Date of Birth: [birth year] Referring Provider:  Elberta Leatherwood, MD  Encounter Date: 04/10/2014      PT End of Session - 04/10/14 1341    Visit Number 2   Number of Visits 13   Date for PT Re-Evaluation 05/04/14   Authorization Type [employer]   Authorization - Visit Number 30   PT Start Time 6270   PT Stop Time 1103   PT Time Calculation (min) 45 min      Past Medical History  Diagnosis Date  . Hyperlipidemia   . Migraine headache   . GERD (gastroesophageal reflux disease)   . Occipital neuralgia     Left  . Chronic kidney disease     stage 3    Past Surgical History  Procedure Laterality Date  . Abdominal hysterectomy    . Knee surgery    . Carpal tunnel release    . Elbow surgery    . Lung surgery    . Bladder repair    . Cervical spine surgery      There were no vitals taken for this visit.  Visit Diagnosis:  Dizziness and giddiness  Abnormality of gait      Subjective Assessment - 04/10/14 1337    Symptoms Pt. reports vertigo occurred when she got out of car to come inside clinic; rates intensity 5/10 ; says MD asked her what she thought of doing Yoga to assist with dizziness   Pertinent History TIA's - 10-10-13;    Patient Stated Goals Resolve the vertigo   Currently in Pain? Yes   Pain Score 2    Pain Location Head   Pain Orientation Right   Pain Descriptors / Indicators Aching;Constant;Dull   Pain Type Chronic pain   Pain Onset More than a month ago   Pain Frequency Constant   Multiple Pain Sites No       Neuro Re-ed:  Sensory Organization test score  53/100 for composite; N= 68/100; normal somatosensory input, decr. Visual With score 70/100 and N= 80/100; decr. Vestibular input with score 30/100 and  N=53/100 Pt. Performed vestibular exercises - standing on rockerboard with head turns horizontal and vertical and standing with eyes closed with  Close CGA; pt. Performed sit to stand/ 1/2 turn to right and back to sitting; sit to stand with 1/2 turn to L and back to sitting 5 times toward Each side; vertigo intensity rated 5-6/10 at end of this activity  Self Care; discussed need for walking program in addition to HEP; pt. Verbalized understanding; also discussed results of SOT with pt. And She verbalized understanding                     PT Education - 04/10/14 1340    Education provided Yes   Education Details continue with balance on foam exercises as instructed previous session   Person(s) Educated Patient   Methods Explanation;Demonstration   Comprehension Verbalized understanding;Returned demonstration          PT Short Term Goals - 04/03/14 1614    PT SHORT TERM GOAL #1   Title Improve DHI to </= 40% to demo improvement in vertigo   Baseline 50/100    target date 05-04-14   Time 4   Period Weeks   Status New  PT SHORT TERM GOAL #2   Title Complete SOT - increase composite score by 10 points - to demo improvement in balance.   Baseline target date 05-04-14   Time 4   Period Weeks   Status New   PT SHORT TERM GOAL #3   Title Independent in HEP for balance and gaze stabilization   Baseline target date 05-04-14   Time 4   Period Weeks   Status New   PT SHORT TERM GOAL #4   Title Report at least 25% improvement in vertigo with sit to stand and with supine to sit.   Baseline target date 05-04-14   Time 4   Period Weeks   Status New           PT Long Term Goals - 04/03/14 1619    PT LONG TERM GOAL #1   Title Increase SOT score by at least 20 points for composite score to demo improved balance.   Baseline target date 06-04-14   Time 8   Period Weeks   Status New   PT LONG TERM GOAL #2   Title Improve DHI score to </= 25/100 to demo improvement in  vertigo   Baseline target date 06-04-14   Time 8   Period Weeks   Status New   PT LONG TERM GOAL #3   Title Report at least 50% improvement in vertigo   Baseline target date 06-04-14   Time 8   Period Weeks   Status New   PT LONG TERM GOAL #4   Title Independent in updated HEP as appropriate    Baseline target date 06-04-14   Time 8   Period Weeks   Status New               Plan - 04/10/14 1342    Clinical Impression Statement Pt. has decreased vestibular input per SOT with score 30/100 with N= 55/100; decr. visual input with score 65/100 with N=80/100;  composite score 53/100 iwth N=68/100   Pt will benefit from skilled therapeutic intervention in order to improve on the following deficits Abnormal gait;Decreased balance;Other (comment)   Rehab Potential Good   PT Frequency 2x / week   PT Duration 4 weeks   PT Treatment/Interventions Therapeutic activities;Patient/family education;Therapeutic exercise;Gait training;Balance training;Neuromuscular re-education;Stair training   PT Next Visit Plan cont. vestibular exercises   PT Home Exercise Plan gaze stabilization; balance on foam   Consulted and Agree with Plan of Care Patient        Problem List Patient Active Problem List   Diagnosis Date Noted  . URI (upper respiratory infection) 02/10/2014  . Migraine 01/03/2014  . Dizziness 11/09/2013  . Adjustment disorder with mixed anxiety and depressed mood 11/03/2013  . Migraine with status migrainosus 10/26/2013  . Migraine variant 10/10/2013  . Disturbance of skin sensation 10/10/2013  . UTI (lower urinary tract infection) 10/10/2013  . TIA (transient ischemic attack) 10/10/2013  . HTN (hypertension) 07/18/2013  . 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 Neuralgia, neuritis, and radiculitis, unspecified 02/04/2012  . Migraine without aura, with intractable migraine, so stated, without mention of status migrainosus 02/04/2012  . 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 Myofascial muscle pain 07/15/2011    Alda Lea, PT 04/10/2014, 1:47 PM  [facility] [address] [place], [place], [address] Phone: [phone]   Fax:  [phone]

## 2014-04-13 ENCOUNTER — Encounter: Payer: Self-pay | Admitting: Rehabilitative and Restorative Service Providers"

## 2014-04-14 ENCOUNTER — Ambulatory Visit: Payer: PRIVATE HEALTH INSURANCE | Admitting: Rehabilitative and Restorative Service Providers"

## 2014-04-14 VITALS — BP 87/60 | HR 85

## 2014-04-14 DIAGNOSIS — R269 Unspecified abnormalities of gait and mobility: Secondary | ICD-10-CM

## 2014-04-14 DIAGNOSIS — Z5189 Encounter for other specified aftercare: Secondary | ICD-10-CM | POA: Diagnosis not present

## 2014-04-14 NOTE — Therapy (Signed)
[facility] [address] [place], [place], [address] Phone: [phone]   Fax:  [phone]  Physical Therapy Treatment  Patient Details  Name: [name] MRN: [id number] Date of Birth: 05-Apr-1950 Referring Provider:  Elberta Leatherwood, MD  Encounter Date: 04/14/2014      PT End of Session - 04/14/14 1415    Visit Number 3   Number of Visits 13   Date for PT Re-Evaluation 05/04/14   Authorization Type [employer]   Authorization - Visit Number 30   PT Start Time 1234   PT Stop Time 1316   PT Time Calculation (min) 42 min      Past Medical History  Diagnosis Date  . Hyperlipidemia   . Migraine headache   . GERD (gastroesophageal reflux disease)   . Occipital neuralgia     Left  . Chronic kidney disease     stage 3    Past Surgical History  Procedure Laterality Date  . Abdominal hysterectomy    . Knee surgery    . Carpal tunnel release    . Elbow surgery    . Lung surgery    . Bladder repair    . Cervical spine surgery      BP 87/60 mmHg  Pulse 85  Visit Diagnosis:  Abnormality of gait      Subjective Assessment - 04/14/14 1238    Symptoms The patient rates baseline dizziness is 3/10, described as "lightheaded" sensation.  She notes a buzzing in her ear described as a humming sound.  She has continued headaches.  The patient has started walking for exercise and is doing slow pace for 30 minutes with UE support on the treadmill.    Currently in Pain? Yes   Pain Score 5    Pain Location Head   Pain Orientation Right   Pain Descriptors / Indicators Aching;Constant;Dull   Pain Type Chronic pain   Pain Onset More than a month ago   Pain Frequency Constant   Aggravating Factors  migraine factors   Pain Relieving Factors topamax   Multiple Pain Sites No     NEUROMUSCULAR RE-EDUCATION: Standing gaze x 1 viewing reviewed with cues on approximately 30 degree head turns to each side in horizontal and  vertical planes Corner balance standing with eyes open, reaching diagonally with head turns, horizontal head turns, and then eyes closed on airex foam Rocker board with wall bumps and shoulder circles with neck stretches to improve postural relaxation instead of shoulder/neck guarding with balance activities 180 degree turns spotting objects, then letting dizziness settle x 3 reps x 3 sets with symptoms 4/10 Sit<>stand with eyes closed  5 reps with close supervision Sit<>stand with quarter turns R and then L x 3 reps, rest and repeated 1 more set  Gait: Ambulation with direction changes with 180 degree turns provoking dizziness. Ambulation with power walking to improve trunk rotation and decrease neck/shoulder guarding with giat Gait activities with horizontal and vertical head turns that provoke 4/10 symptoms with supervision       PT Short Term Goals - 04/03/14 1614    PT SHORT TERM GOAL #1   Title Improve DHI to </= 40% to demo improvement in vertigo   Baseline 50/100    target date 05-04-14   Time 4   Period Weeks   Status New   PT SHORT TERM GOAL #2   Title Complete SOT - increase composite score by 10 points - to demo improvement  in balance.   Baseline target date 05-04-14   Time 4   Period Weeks   Status New   PT SHORT TERM GOAL #3   Title Independent in HEP for balance and gaze stabilization   Baseline target date 05-04-14   Time 4   Period Weeks   Status New   PT SHORT TERM GOAL #4   Title Report at least 25% improvement in vertigo with sit to stand and with supine to sit.   Baseline target date 05-04-14   Time 4   Period Weeks   Status New           PT Long Term Goals - 04/03/14 1619    PT LONG TERM GOAL #1   Title Increase SOT score by at least 20 points for composite score to demo improved balance.   Baseline target date 06-04-14   Time 8   Period Weeks   Status New   PT LONG TERM GOAL #2   Title Improve DHI score to </= 25/100 to demo improvement in  vertigo   Baseline target date 06-04-14   Time 8   Period Weeks   Status New   PT LONG TERM GOAL #3   Title Report at least 50% improvement in vertigo   Baseline target date 06-04-14   Time 8   Period Weeks   Status New   PT LONG TERM GOAL #4   Title Independent in updated HEP as appropriate    Baseline target date 06-04-14   Time 8   Period Weeks   Status New               Plan - 04/14/14 1314    Clinical Impression Statement The patient has multiple factors contributing to dizziness including low blood pressure today, migraine, as well as dizziness.  PT and patient discussed learning self management of symptoms as some of her contributing factors will be chronic in nature.  The patient is motivated to participate and tolerated therapy well.     PT Next Visit Plan Possibly add 180 degree turns with spotting objects to HEP, progress gaze and balance, as indicated.   Consulted and Agree with Plan of Care Patient        Problem List Patient Active Problem List   Diagnosis Date Noted  . URI (upper respiratory infection) 02/10/2014  . Migraine 01/03/2014  . Dizziness 11/09/2013  . Adjustment disorder with mixed anxiety and depressed mood 11/03/2013  . Migraine with status migrainosus 10/26/2013  . Migraine variant 10/10/2013  . Disturbance of skin sensation 10/10/2013  . UTI (lower urinary tract infection) 10/10/2013  . TIA (transient ischemic attack) 10/10/2013  . HTN (hypertension) 07/18/2013  . 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 Neuralgia, neuritis, and radiculitis, unspecified 02/04/2012  . Migraine without aura, with intractable migraine, so stated, without mention of status migrainosus 02/04/2012  . 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 Myofascial muscle pain 07/15/2011    Libero Puthoff, PT 04/14/2014, 2:18 PM  [facility] [address] [place], [place], [address] Phone: [phone]    Fax:  [phone]

## 2014-04-17 ENCOUNTER — Ambulatory Visit: Payer: PRIVATE HEALTH INSURANCE | Admitting: Physical Therapy

## 2014-04-17 DIAGNOSIS — Z5189 Encounter for other specified aftercare: Secondary | ICD-10-CM | POA: Diagnosis not present

## 2014-04-17 DIAGNOSIS — R42 Dizziness and giddiness: Secondary | ICD-10-CM

## 2014-04-18 ENCOUNTER — Encounter: Payer: Self-pay | Admitting: Physical Therapy

## 2014-04-18 NOTE — Therapy (Signed)
[facility] [address] [place], [place], [address] Phone: [phone]   Fax:  [phone]  Physical Therapy Treatment  Patient Details  Name: [name] MRN: [id number] Date of Birth: 16-Oct-1950 Referring Provider:  Elberta Leatherwood, MD  Encounter Date: 04/17/2014      PT End of Session - 04/18/14 0743    Visit Number 4   Number of Visits 13   Date for PT Re-Evaluation 05/04/14   Authorization Type [employer]   Authorization - Visit Number 30   PT Start Time 0962   PT Stop Time 1452   PT Time Calculation (min) 46 min      Past Medical History  Diagnosis Date  . Hyperlipidemia   . Migraine headache   . GERD (gastroesophageal reflux disease)   . Occipital neuralgia     Left  . Chronic kidney disease     stage 3    Past Surgical History  Procedure Laterality Date  . Abdominal hysterectomy    . Knee surgery    . Carpal tunnel release    . Elbow surgery    . Lung surgery    . Bladder repair    . Cervical spine surgery      There were no vitals taken for this visit.  Visit Diagnosis:  Dizziness and giddiness      Subjective Assessment - 04/18/14 0741    Symptoms Pt. rates dizziness 3-4/10 today; states she has been doing exercises at home and states "I'm really trying"   Pertinent History TIA's - 10-10-13;    Patient Stated Goals Resolve the vertigo   Currently in Pain? Yes   Pain Score 4    Pain Location Head   Pain Orientation Right   Pain Descriptors / Indicators Aching;Constant   Pain Type Chronic pain      Neuro Re-ed:  Pt. Performed vestibular balance exercises on blue foam and on blue balance beam - standing with EO and EC and with head turns horizontal and vertical; with feet apart and together; also in partial tandem stance; Sit to stand 3 reps toward R and L complete 360 degree turns; 5 reps alternating 1/2 turn toward R and L side; both activities provoked  Vertigo with rating to  4/10 intensity;  Marching on incline with head turns up/down and side/side and with EC with min assist For recovery;  rockerboard inside bars with head turns and with EC with minimal UE support  Amb. 120' x 2 reps on track with head turns for environmental scanning with cues to incr. Arm swing; amb. 120' tossing ball for improved gaze  Stabilization and for incr. Visual input; amb. Backwards 35' x 1 tossing ball with CGA  Standing in corner on blue foam - turning side to side touching wall and diagonals touching wall x 10 reps each side; vertigo reported to be 4-5/10 after This activity                        PT Short Term Goals - 04/03/14 1614    PT SHORT TERM GOAL #1   Title Improve DHI to </= 40% to demo improvement in vertigo   Baseline 50/100    target date 05-04-14   Time 4   Period Weeks   Status New   PT SHORT TERM GOAL #2   Title Complete SOT - increase composite score by 10 points - to demo improvement in balance.   Baseline  target date 05-04-14   Time 4   Period Weeks   Status New   PT SHORT TERM GOAL #3   Title Independent in HEP for balance and gaze stabilization   Baseline target date 05-04-14   Time 4   Period Weeks   Status New   PT SHORT TERM GOAL #4   Title Report at least 25% improvement in vertigo with sit to stand and with supine to sit.   Baseline target date 05-04-14   Time 4   Period Weeks   Status New           PT Long Term Goals - 04/03/14 1619    PT LONG TERM GOAL #1   Title Increase SOT score by at least 20 points for composite score to demo improved balance.   Baseline target date 06-04-14   Time 8   Period Weeks   Status New   PT LONG TERM GOAL #2   Title Improve DHI score to </= 25/100 to demo improvement in vertigo   Baseline target date 06-04-14   Time 8   Period Weeks   Status New   PT LONG TERM GOAL #3   Title Report at least 50% improvement in vertigo   Baseline target date 06-04-14   Time 8   Period Weeks    Status New   PT LONG TERM GOAL #4   Title Independent in updated HEP as appropriate    Baseline target date 06-04-14   Time 8   Period Weeks   Status New               Plan - 04/18/14 0744    Clinical Impression Statement Pt. reports moderate dizziness with quick turns - rates it 4-5/10 but states that it quickly settles, especially with a seated rest period; symptoms appear somewhat consistent with a hypofunction   Pt will benefit from skilled therapeutic intervention in order to improve on the following deficits Abnormal gait;Decreased balance;Other (comment)   Rehab Potential Good   Clinical Impairments Affecting Rehab Potential migraines   PT Frequency 2x / week   PT Duration 4 weeks   PT Treatment/Interventions Therapeutic activities;Patient/family education;Therapeutic exercise;Gait training;Balance training;Neuromuscular re-education;Stair training   PT Next Visit Plan continue vestibular rehab and dynamic gait activities   PT Home Exercise Plan gaze stabilization; balance on foam   Consulted and Agree with Plan of Care Patient        Problem List Patient Active Problem List   Diagnosis Date Noted  . URI (upper respiratory infection) 02/10/2014  . Migraine 01/03/2014  . Dizziness 11/09/2013  . Adjustment disorder with mixed anxiety and depressed mood 11/03/2013  . Migraine with status migrainosus 10/26/2013  . Migraine variant 10/10/2013  . Disturbance of skin sensation 10/10/2013  . UTI (lower urinary tract infection) 10/10/2013  . TIA (transient ischemic attack) 10/10/2013  . HTN (hypertension) 07/18/2013  . 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 Neuralgia, neuritis, and radiculitis, unspecified 02/04/2012  . Migraine without aura, with intractable migraine, so stated, without mention of status migrainosus 02/04/2012  . 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 Myofascial muscle pain 07/15/2011    Aftin Lye, [place], PT 04/18/2014, 7:47 AM  [facility] [address] [place], [place], [address] Phone: [phone]   Fax:  [phone]

## 2014-04-20 ENCOUNTER — Ambulatory Visit: Payer: PRIVATE HEALTH INSURANCE | Admitting: Physical Therapy

## 2014-04-20 DIAGNOSIS — Z5189 Encounter for other specified aftercare: Secondary | ICD-10-CM | POA: Diagnosis not present

## 2014-04-20 DIAGNOSIS — R42 Dizziness and giddiness: Secondary | ICD-10-CM

## 2014-04-21 ENCOUNTER — Encounter: Payer: Self-pay | Admitting: Physical Therapy

## 2014-04-21 NOTE — Therapy (Signed)
[facility] [address] [place], [place], [address] Phone: [phone]   Fax:  [phone]  Physical Therapy Treatment  Patient Details  Name: [name] MRN: [id number] Date of Birth: May 20, 1950 Referring Provider:  Elberta Leatherwood, MD  Encounter Date: 04/20/2014      PT End of Session - 04/21/14 1027    Visit Number 5   Number of Visits 13   Date for PT Re-Evaluation 05/04/14   Authorization Type [employer]   Authorization - Visit Number 30   PT Start Time 1450   PT Stop Time 1530   PT Time Calculation (min) 40 min      Past Medical History  Diagnosis Date  . Hyperlipidemia   . Migraine headache   . GERD (gastroesophageal reflux disease)   . Occipital neuralgia     Left  . Chronic kidney disease     stage 3    Past Surgical History  Procedure Laterality Date  . Abdominal hysterectomy    . Knee surgery    . Carpal tunnel release    . Elbow surgery    . Lung surgery    . Bladder repair    . Cervical spine surgery      There were no vitals taken for this visit.  Visit Diagnosis:  Dizziness and giddiness      Subjective Assessment - 04/21/14 1025    Symptoms Pt. rates dizziness 2-3/10 today; states she has had to do a lot of walking due to her husband being admittted to hospital - has been walking alot to get to his room   Pertinent History TIA's - 10-10-13;    Diagnostic tests MRI, CT WNLs per patient report.   Patient Stated Goals Resolve the vertigo   Currently in Pain? No/denies     NeuroRe-ed: vestibular stimulation exercises on compliant surfaces with EO, EC and with horizontal and vertical  Head turns with CGA; standing and marching on incline/decline on mat on ramp with EO/EC and with head turns; targets used  with EO for  Improved gaze stabilization; ambulating tossing ball 120' x 1 and 30' backward with CGA for improved  VOR function Habituation ex.; sit to stand with 180 and 360  degree turns to R and L sides - 3 reps each direction with CGA Fig. 8's with ball between legs (down and up)   Rockerboard anterior/posterior and laterally with EO with targets for gaze stabillization and with EC for incr. Vestibular function  With UE support prn with CGA                       PT Short Term Goals - 04/03/14 1614    PT SHORT TERM GOAL #1   Title Improve DHI to </= 40% to demo improvement in vertigo   Baseline 50/100    target date 05-04-14   Time 4   Period Weeks   Status New   PT SHORT TERM GOAL #2   Title Complete SOT - increase composite score by 10 points - to demo improvement in balance.   Baseline target date 05-04-14   Time 4   Period Weeks   Status New   PT SHORT TERM GOAL #3   Title Independent in HEP for balance and gaze stabilization   Baseline target date 05-04-14   Time 4   Period Weeks   Status New   PT SHORT TERM GOAL #4   Title Report at least  25% improvement in vertigo with sit to stand and with supine to sit.   Baseline target date 05-04-14   Time 4   Period Weeks   Status New           PT Long Term Goals - 04/03/14 1619    PT LONG TERM GOAL #1   Title Increase SOT score by at least 20 points for composite score to demo improved balance.   Baseline target date 06-04-14   Time 8   Period Weeks   Status New   PT LONG TERM GOAL #2   Title Improve DHI score to </= 25/100 to demo improvement in vertigo   Baseline target date 06-04-14   Time 8   Period Weeks   Status New   PT LONG TERM GOAL #3   Title Report at least 50% improvement in vertigo   Baseline target date 06-04-14   Time 8   Period Weeks   Status New   PT LONG TERM GOAL #4   Title Independent in updated HEP as appropriate    Baseline target date 06-04-14   Time 8   Period Weeks   Status New               Plan - 04/21/14 1027    Clinical Impression Statement Pt. reports slight decrease in vertigo today - balnce seems to be improving during  vestibular activities   Pt will benefit from skilled therapeutic intervention in order to improve on the following deficits Abnormal gait;Decreased balance;Other (comment)   Rehab Potential Good   Clinical Impairments Affecting Rehab Potential migraines   PT Frequency 2x / week   PT Duration 4 weeks   PT Treatment/Interventions Therapeutic activities;Patient/family education;Therapeutic exercise;Gait training;Balance training;Neuromuscular re-education;Stair training   PT Next Visit Plan continue vestibular rehab and dynamic gait activities   PT Home Exercise Plan gaze stabilization; balance on foam   Consulted and Agree with Plan of Care Patient        Problem List Patient Active Problem List   Diagnosis Date Noted  . URI (upper respiratory infection) 02/10/2014  . Migraine 01/03/2014  . Dizziness 11/09/2013  . Adjustment disorder with mixed anxiety and depressed mood 11/03/2013  . Migraine with status migrainosus 10/26/2013  . Migraine variant 10/10/2013  . Disturbance of skin sensation 10/10/2013  . UTI (lower urinary tract infection) 10/10/2013  . TIA (transient ischemic attack) 10/10/2013  . HTN (hypertension) 07/18/2013  . 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 Neuralgia, neuritis, and radiculitis, unspecified 02/04/2012  . Migraine without aura, with intractable migraine, so stated, without mention of status migrainosus 02/04/2012  . 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 Myofascial muscle pain 07/15/2011    Alda Lea, PT 04/21/2014, 10:29 AM  [facility] [address] [place], [place], [address] Phone: [phone]   Fax:  [phone]

## 2014-04-24 ENCOUNTER — Ambulatory Visit: Payer: PRIVATE HEALTH INSURANCE | Admitting: Physical Therapy

## 2014-04-25 ENCOUNTER — Encounter: Payer: Self-pay | Admitting: Physical Therapy

## 2014-04-25 ENCOUNTER — Ambulatory Visit: Payer: PRIVATE HEALTH INSURANCE | Admitting: Physical Therapy

## 2014-04-25 DIAGNOSIS — Z5189 Encounter for other specified aftercare: Secondary | ICD-10-CM | POA: Diagnosis not present

## 2014-04-25 DIAGNOSIS — R42 Dizziness and giddiness: Secondary | ICD-10-CM

## 2014-04-25 NOTE — Therapy (Signed)
[facility] [address] [place], [place], [address] Phone: [phone]   Fax:  [phone]  Physical Therapy Treatment  Patient Details  Name: [name] MRN: [id number] Date of Birth: [birth year] Referring Provider:  Elberta Leatherwood, MD  Encounter Date: 04/25/2014      PT End of Session - 04/25/14 1610    Visit Number 6   Number of Visits 13   Date for PT Re-Evaluation 05/04/14   Authorization Type [employer]   Authorization - Visit Number 30   PT Start Time 1401   PT Stop Time 1446   PT Time Calculation (min) 45 min      Past Medical History  Diagnosis Date  . Hyperlipidemia   . Migraine headache   . GERD (gastroesophageal reflux disease)   . Occipital neuralgia     Left  . Chronic kidney disease     stage 3    Past Surgical History  Procedure Laterality Date  . Abdominal hysterectomy    . Knee surgery    . Carpal tunnel release    . Elbow surgery    . Lung surgery    . Bladder repair    . Cervical spine surgery      There were no vitals taken for this visit.  Visit Diagnosis:  Dizziness and giddiness      Subjective Assessment - 04/25/14 1605    Symptoms Pt. states she feels that dizziness is slowly improving - not as intense as it was initially; rates vertigo 2/10 prior to start of session   Pertinent History TIA's - 10-10-13;    Patient Stated Goals Resolve the vertigo   Currently in Pain? Yes   Pain Score 4    Pain Location Foot   Pain Orientation Left  little toe due to fracture   Pain Descriptors / Indicators Sharp;Sore   Pain Type Acute pain   Pain Onset In the past 7 days   Pain Frequency Constant   Aggravating Factors  weight bearing on R lateral foot   Pain Relieving Factors non-weight bearing positions   Multiple Pain Sites No      Neuro Re-ed:  Vestibular stimulation exercises - marching on blue foam with head turns and with EO/EC:  Marching on mat on incline and On  decline with horizontal and vertical head turns with EO and EC; targets with EO for both side/side and up/down for improved Gaze stabilization: standing on BOSU and on rockerboard with EO and with EC with specific targets with EO and with head turns with  EC; squats and trunk rotation on BOSU with EC for incr. Vestibular function  Picking up cones off floor, turning 180 degrees to place up into cabinet - x 6 reps toward left side and 6 reps toward right side; pt. Reported > dizziness when turning toward left side than toward right side   Sit to stand 3 turns 180 degrees and back to seated position toward R and L sides  TherEx:  Leg Press 50# 4 sets 10 reps bil. LE's for strengthening                    PT Short Term Goals - 04/03/14 1614    PT SHORT TERM GOAL #1   Title Improve DHI to </= 40% to demo improvement in vertigo   Baseline 50/100    target date 05-04-14   Time 4   Period Weeks   Status New  PT SHORT TERM GOAL #2   Title Complete SOT - increase composite score by 10 points - to demo improvement in balance.   Baseline target date 05-04-14   Time 4   Period Weeks   Status New   PT SHORT TERM GOAL #3   Title Independent in HEP for balance and gaze stabilization   Baseline target date 05-04-14   Time 4   Period Weeks   Status New   PT SHORT TERM GOAL #4   Title Report at least 25% improvement in vertigo with sit to stand and with supine to sit.   Baseline target date 05-04-14   Time 4   Period Weeks   Status New           PT Long Term Goals - 04/03/14 1619    PT LONG TERM GOAL #1   Title Increase SOT score by at least 20 points for composite score to demo improved balance.   Baseline target date 06-04-14   Time 8   Period Weeks   Status New   PT LONG TERM GOAL #2   Title Improve DHI score to </= 25/100 to demo improvement in vertigo   Baseline target date 06-04-14   Time 8   Period Weeks   Status New   PT LONG TERM GOAL #3   Title Report at  least 50% improvement in vertigo   Baseline target date 06-04-14   Time 8   Period Weeks   Status New   PT LONG TERM GOAL #4   Title Independent in updated HEP as appropriate    Baseline target date 06-04-14   Time 8   Period Weeks   Status New               Plan - 04/25/14 1610    Clinical Impression Statement Pt. progressing well towards goals - cont. to c/o dizziness which is worse when turning toward left side - states vertigo is not as intense when turning toward R side   Pt will benefit from skilled therapeutic intervention in order to improve on the following deficits Abnormal gait;Decreased balance;Other (comment)  vertigo   Rehab Potential Good   Clinical Impairments Affecting Rehab Potential migraines   PT Frequency 2x / week   PT Duration 4 weeks   PT Treatment/Interventions Therapeutic activities;Patient/family education;Therapeutic exercise;Gait training;Balance training;Neuromuscular re-education;Stair training   PT Next Visit Plan continue vestibular rehab and dynamic gait activities   PT Home Exercise Plan gaze stabilization; balance on foam        Problem List Patient Active Problem List   Diagnosis Date Noted  . URI (upper respiratory infection) 02/10/2014  . Migraine 01/03/2014  . Dizziness 11/09/2013  . Adjustment disorder with mixed anxiety and depressed mood 11/03/2013  . Migraine with status migrainosus 10/26/2013  . Migraine variant 10/10/2013  . Disturbance of skin sensation 10/10/2013  . UTI (lower urinary tract infection) 10/10/2013  . TIA (transient ischemic attack) 10/10/2013  . HTN (hypertension) 07/18/2013  . 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 Neuralgia, neuritis, and radiculitis, unspecified 02/04/2012  . Migraine without aura, with intractable migraine, so stated, without mention of status migrainosus 02/04/2012  . 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 Myofascial muscle pain 07/15/2011    Alda Lea,  PT 04/25/2014, 4:17 PM  [facility] [address] [place], [place], [address] Phone: [phone]   Fax:  [phone]

## 2014-04-27 ENCOUNTER — Ambulatory Visit: Payer: PRIVATE HEALTH INSURANCE | Admitting: Physical Therapy

## 2014-05-01 ENCOUNTER — Ambulatory Visit: Payer: PRIVATE HEALTH INSURANCE | Admitting: Physical Therapy

## 2014-05-01 ENCOUNTER — Encounter: Payer: Self-pay | Admitting: Physical Therapy

## 2014-05-01 DIAGNOSIS — Z5189 Encounter for other specified aftercare: Secondary | ICD-10-CM | POA: Diagnosis not present

## 2014-05-01 DIAGNOSIS — R42 Dizziness and giddiness: Secondary | ICD-10-CM

## 2014-05-01 NOTE — Therapy (Signed)
[facility] [address] [place], [place], [address] Phone: [phone]   Fax:  [phone]  Physical Therapy Treatment  Patient Details  Name: [name] MRN: [id number] Date of Birth: 10-Jan-1951 Referring Provider:  Elberta Leatherwood, MD  Encounter Date: 05/01/2014      PT End of Session - 05/01/14 2120    Visit Number 7   Number of Visits 13   Date for PT Re-Evaluation 05/04/14   Authorization Type [employer]   Authorization - Visit Number 30   PT Start Time 2841   PT Stop Time 1403   PT Time Calculation (min) 48 min      Past Medical History  Diagnosis Date  . Hyperlipidemia   . Migraine headache   . GERD (gastroesophageal reflux disease)   . Occipital neuralgia     Left  . Chronic kidney disease     stage 3    Past Surgical History  Procedure Laterality Date  . Abdominal hysterectomy    . Knee surgery    . Carpal tunnel release    . Elbow surgery    . Lung surgery    . Bladder repair    . Cervical spine surgery      There were no vitals taken for this visit.  Visit Diagnosis:  Dizziness and giddiness      Subjective Assessment - 05/01/14 2118    Symptoms "I'm doing better - not really dizzy right now" (at start of PT session)   Pertinent History TIA's - 10-10-13;    Patient Stated Goals Resolve the vertigo   Currently in Pain? No/denies     NeuroRe-ed: vestibular stimulation exercises - on floor, on incline and decline and on foam with EO and EC; with head turns Horizontal and vertical; standing on BOSU with weight shifts anterior/posterior with UE support; squats with EC x 10 reps Standing in corner touching walls evenly on each side, then down low right to left and up above head 10 times right to left Amb. Tossing ball 120' and 30' backward with CGA Amb. Making circles clockwise and counterclockwise tracking with yellow ball with ambulating 120' on track with CGA Fig. 8's with ball with  180 degree turns - performed standing on blue mat with CGA                        PT Short Term Goals - 04/03/14 1614    PT SHORT TERM GOAL #1   Title Improve DHI to </= 40% to demo improvement in vertigo   Baseline 50/100    target date 05-04-14   Time 4   Period Weeks   Status New   PT SHORT TERM GOAL #2   Title Complete SOT - increase composite score by 10 points - to demo improvement in balance.   Baseline target date 05-04-14   Time 4   Period Weeks   Status New   PT SHORT TERM GOAL #3   Title Independent in HEP for balance and gaze stabilization   Baseline target date 05-04-14   Time 4   Period Weeks   Status New   PT SHORT TERM GOAL #4   Title Report at least 25% improvement in vertigo with sit to stand and with supine to sit.   Baseline target date 05-04-14   Time 4   Period Weeks   Status New           PT  Long Term Goals - 04/03/14 1619    PT LONG TERM GOAL #1   Title Increase SOT score by at least 20 points for composite score to demo improved balance.   Baseline target date 06-04-14   Time 8   Period Weeks   Status New   PT LONG TERM GOAL #2   Title Improve DHI score to </= 25/100 to demo improvement in vertigo   Baseline target date 06-04-14   Time 8   Period Weeks   Status New   PT LONG TERM GOAL #3   Title Report at least 50% improvement in vertigo   Baseline target date 06-04-14   Time 8   Period Weeks   Status New   PT LONG TERM GOAL #4   Title Independent in updated HEP as appropriate    Baseline target date 06-04-14   Time 8   Period Weeks   Status New               Plan - 05/01/14 2120    Clinical Impression Statement Pt. progressing well - cont. to report vertigo with turning and with movement up and down but pt able to rest and let it settle and then cont. with exercises   Pt will benefit from skilled therapeutic intervention in order to improve on the following deficits Abnormal gait;Decreased balance;Other  (comment)   Rehab Potential Good   Clinical Impairments Affecting Rehab Potential migraines   PT Frequency 2x / week   PT Duration 4 weeks   PT Treatment/Interventions Therapeutic activities;Patient/family education;Therapeutic exercise;Gait training;Balance training;Neuromuscular re-education;Stair training   PT Next Visit Plan check STG's - redo SOT   PT Home Exercise Plan gaze stabilization; balance on foam   Consulted and Agree with Plan of Care Patient        Problem List Patient Active Problem List   Diagnosis Date Noted  . URI (upper respiratory infection) 02/10/2014  . Migraine 01/03/2014  . Dizziness 11/09/2013  . Adjustment disorder with mixed anxiety and depressed mood 11/03/2013  . Migraine with status migrainosus 10/26/2013  . Migraine variant 10/10/2013  . Disturbance of skin sensation 10/10/2013  . UTI (lower urinary tract infection) 10/10/2013  . TIA (transient ischemic attack) 10/10/2013  . HTN (hypertension) 07/18/2013  . 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 Neuralgia, neuritis, and radiculitis, unspecified 02/04/2012  . Migraine without aura, with intractable migraine, so stated, without mention of status migrainosus 02/04/2012  . 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 Myofascial muscle pain 07/15/2011    Alda Lea, PT 05/01/2014, 9:24 PM  [facility] [address] [place], [place], [address] Phone: [phone]   Fax:  [phone]

## 2014-05-04 ENCOUNTER — Ambulatory Visit: Payer: PRIVATE HEALTH INSURANCE | Admitting: Physical Therapy

## 2014-05-04 DIAGNOSIS — Z5189 Encounter for other specified aftercare: Secondary | ICD-10-CM | POA: Diagnosis not present

## 2014-05-04 DIAGNOSIS — R42 Dizziness and giddiness: Secondary | ICD-10-CM

## 2014-05-05 ENCOUNTER — Encounter: Payer: Self-pay | Admitting: Physical Therapy

## 2014-05-05 NOTE — Therapy (Signed)
[facility] [address] [place], [place], [address] Phone: [phone]   Fax:  [phone]  Physical Therapy Treatment  Patient Details  Name: [name] MRN: [id number] Date of Birth: 08-11-1950 Referring Provider:  Elberta Leatherwood, MD  Encounter Date: 05/04/2014      PT End of Session - 05/05/14 0948    Visit Number 8   Number of Visits 13   Date for PT Re-Evaluation 06/02/14   Authorization Type [employer]   Authorization - Visit Number 30   PT Start Time 1446   PT Stop Time 1535   PT Time Calculation (min) 49 min      Past Medical History  Diagnosis Date  . Hyperlipidemia   . Migraine headache   . GERD (gastroesophageal reflux disease)   . Occipital neuralgia     Left  . Chronic kidney disease     stage 3    Past Surgical History  Procedure Laterality Date  . Abdominal hysterectomy    . Knee surgery    . Carpal tunnel release    . Elbow surgery    . Lung surgery    . Bladder repair    . Cervical spine surgery      There were no vitals taken for this visit.  Visit Diagnosis:  Dizziness and giddiness      Subjective Assessment - 05/05/14 0947    Symptoms Pt. states she is feeling much better - "still there some but I'm better"; states she only has 1 more visit approved after today due to current insurance ending   Pertinent History TIA's - 10-10-13;    Diagnostic tests MRI, CT WNLs per patient report.   Patient Stated Goals Resolve the vertigo   Currently in Pain? No/denies      Neuro Re-ed:  Sensory Organization Test score 69/100 for composite score; slightly decr. Somatosensory from N, visual WNL's and vestibular decr. Approx. 40% from normal but improved from initial test score  Pt. Performed vestibular balance activities - marching on mat on incline and decline with EO and EC and with head turns with CGA; alternate stepping on incline, decline and sideways facing both directions x 10  reps each - with EO with head turns and with EC with head turns Pt. Performed habituation ex - picking up cones off floor standing on mat - turnign 180 degrees around to place in cabinet X 2 reps 6 cones - turning toward R and L sides with CGA  Self Care; pt. Completed DHI - score now 26/100 - improved since initial eval with score 50/100 (50% self perceived disability rating scale) Discussed and compared results of initial SOT with pt. - pt verbalized understanding of results                      PT Short Term Goals - 05/04/14 1548    PT SHORT TERM GOAL #1   Title Improve DHI to </= 40% to demo improvement in vertigo   Baseline score 26/100 05-04-14   Time 4   Period Weeks   Status Achieved   PT SHORT TERM GOAL #2   Title Complete SOT - increase composite score by 10 points - to demo improvement in balance.   Baseline score 69/100 for SOT composite score on 05-04-14 ; initial score 56/100   Time 4   Period Weeks   Status Achieved   PT SHORT TERM GOAL #3   Title Independent in  HEP for balance and gaze stabilization   Baseline target date 05-04-14   Time 4   Period Weeks   Status Achieved   PT SHORT TERM GOAL #4   Title Report at least 25% improvement in vertigo with sit to stand and with supine to sit.   Baseline partially met per pt. report - depends on speed of transfers   Time 4   Period Weeks   Status Partially Met           PT Long Term Goals - 05/05/14 1001    PT LONG TERM GOAL #1   Title Increase SOT score by at least 20 points for composite score to demo improved balance.   Baseline target date 06-04-14   Time 8   Period Weeks   Status New   PT LONG TERM GOAL #2   Title Improve DHI score to </= 25/100 to demo improvement in vertigo   Baseline target date 06-04-14   Time 8   Period Weeks   Status New   PT LONG TERM GOAL #3   Title Report at least 50% improvement in vertigo   Baseline target date 06-04-14   Time 8   Period Weeks   Status New    PT LONG TERM GOAL #4   Title Independent in updated HEP as appropriate    Baseline target date 06-04-14   Time 8   Period Weeks   Status New               Plan - 05/05/14 8032    Clinical Impression Statement Pt. has met all STG's - composite SOT has increased from 56/100 to 69/100; vestibular remains decr. but is increasing in input per score   Pt will benefit from skilled therapeutic intervention in order to improve on the following deficits Abnormal gait;Decreased balance;Other (comment)   Rehab Potential Good   Clinical Impairments Affecting Rehab Potential migraines   PT Frequency 2x / week   PT Duration 4 weeks   PT Treatment/Interventions Therapeutic activities;Patient/family education;Therapeutic exercise;Gait training;Balance training;Neuromuscular re-education;Stair training   PT Next Visit Plan cont. toward LTG's   PT Home Exercise Plan gaze stabilization; balance on foam   Consulted and Agree with Plan of Care Patient        Problem List Patient Active Problem List   Diagnosis Date Noted  . URI (upper respiratory infection) 02/10/2014  . Migraine 01/03/2014  . Dizziness 11/09/2013  . Adjustment disorder with mixed anxiety and depressed mood 11/03/2013  . Migraine with status migrainosus 10/26/2013  . Migraine variant 10/10/2013  . Disturbance of skin sensation 10/10/2013  . UTI (lower urinary tract infection) 10/10/2013  . TIA (transient ischemic attack) 10/10/2013  . HTN (hypertension) 07/18/2013  . 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 Neuralgia, neuritis, and radiculitis, unspecified 02/04/2012  . Migraine without aura, with intractable migraine, so stated, without mention of status migrainosus 02/04/2012  . 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 Myofascial muscle pain 07/15/2011    Alda Lea, PT 05/05/2014, 10:02 AM  [facility] [address] [place], [place],  [address] Phone: [phone]   Fax:  [phone]

## 2014-05-08 ENCOUNTER — Ambulatory Visit (INDEPENDENT_AMBULATORY_CARE_PROVIDER_SITE_OTHER): Payer: No Typology Code available for payment source | Admitting: Adult Health

## 2014-05-08 ENCOUNTER — Encounter: Payer: Self-pay | Admitting: Adult Health

## 2014-05-08 VITALS — BP 122/78 | HR 78 | Ht 67.0 in | Wt 166.0 lb

## 2014-05-08 DIAGNOSIS — G43011 Migraine without aura, intractable, with status migrainosus: Secondary | ICD-10-CM | POA: Diagnosis not present

## 2014-05-08 DIAGNOSIS — R29898 Other symptoms and signs involving the musculoskeletal system: Secondary | ICD-10-CM | POA: Diagnosis not present

## 2014-05-08 MED ORDER — TOPIRAMATE 100 MG PO TABS: 100.0000 mg | ORAL_TABLET | Freq: Two times a day (BID) | ORAL | Status: DC

## 2014-05-08 NOTE — Patient Instructions (Signed)
Increase Topamax to 100 mg twice a day. Refill sent.  Continue Tizanidine.  Please call if your symptoms worsen or fail to improve.

## 2014-05-08 NOTE — Progress Notes (Signed)
PATIENT: [name] DOB: 28-Dec-1950  REASON FOR VISIT: follow up- Migraines HISTORY FROM: patient  HISTORY OF PRESENT ILLNESS: [name] is a 64 year old female with a history of migraine headaches. She returns today for follow-up. She is currently taking Topamax and Tizanidine. She reports that her headache severity have improved. She states that she has a daily headache. Reports her pain 5/10.  She states that her headaches are normally located in the right temporal region.  Nausea with the severe migraines. Photophobia and phonophobia only with the severe migraines. She states that when she gets a severe migraine she will get in a dark quiet room and it will eventually pass. Patient continues to have right sided weakness. She is currently in physical therapy and they are working with her regarding the right sided weakness. Overall the patient feels that she has improved and is feeling better. No new medical issues.  HISTORY 01/03/14: [name] is a 64 year old female with a history of migraines. She returns today for follow-up. She is currently taking tizanidine 4 mg in the morning, 2 mg at noon and 4 mg at night. She reports that her headaches have improved. She may still have a daily dull headache. She has not had a severe headache since the last visit. She does have nausea with her headaches but no vomiting. + photophobia and phonophobia. She has continued to have the right sided weakness. She continues to do exercises at home to improve her strength. She was recently seen by her PCP for dizziness. She was referred to neuro rehab for vestibular rehab.   HISTORY 11/03/13: 64 year old female with a history of migraines. Patient returns today stating that her headache has not improved. She states that it is located on the right side of her head. She rates her pain 4 /10. She has sensory changes on the right side. She also feels weaker on the right side. When I saw this patient a week ago  she had been admitted to the hospital and stroke was ruled out. She was given a Depacon infusion in the office that brought her pain level down to a 4/10 and it has remained that way since the visit. She has stage 3 CKD. Patient husband feels that she is depressed. She agrees. States that since her dad passed she has been having a " hard time." She would like to consider anti-depressant medication. She states that the headache doesn't help.   REVIEW OF SYSTEMS: Out of a complete 14 system review of symptoms, the patient complains only of the following symptoms, and all other reviewed systems are negative.   ALLERGIES: Allergies  Allergen Reactions  . Shellfish Allergy Anaphylaxis and Other (See Comments)    All seafood    HOME MEDICATIONS: Outpatient Prescriptions Prior to Visit  Medication Sig Dispense Refill  . clopidogrel (PLAVIX) 75 MG tablet TAKE 1 TABLET BY MOUTH EVERY DAY 30 tablet 3  . doxepin (SINEQUAN) 100 MG capsule Take 100 mg by mouth at bedtime.    Marland Kitchen doxepin (SINEQUAN) 100 MG capsule TAKE 2 CAPSULES BY MOUTH AT BEDTIME 180 capsule 1  . esomeprazole (NEXIUM) 40 MG capsule Take 1 capsule (40 mg total) by mouth daily before breakfast. 30 capsule 3  . gabapentin (NEURONTIN) 300 MG capsule Take 300 mg by mouth at bedtime.    Marland Kitchen guaiFENesin (MUCINEX) 600 MG 12 hr tablet Take 1 tablet (600 mg total) by mouth 2 (two) times daily as needed  for cough or to loosen phlegm. 30 tablet 0  . lidocaine (LIDODERM) 5 % Place 1 patch onto the skin daily. Remove & Discard patch within 12 hours or as directed by MD    . ramipril (ALTACE) 5 MG tablet Take 5 mg by mouth daily.    . sertraline (ZOLOFT) 25 MG tablet TAKE 1 TABLET BY MOUTH EVERY DAY 30 tablet 3  . tiZANidine (ZANAFLEX) 2 MG tablet Take 1 tablet by mouth twice a day for one week, then increase to three times a day thereafter. 90 tablet 3  . topiramate (TOPAMAX) 100 MG tablet TAKE 1 TABLET BY MOUTH AT BEDTIME 90 tablet 1  . topiramate  (TOPAMAX) 50 MG tablet Take 1 tablet (50 mg total) by mouth every morning. 30 tablet 5   Facility-Administered Medications Prior to Visit  Medication Dose Route Frequency Provider Last Rate Last Dose  . valproate (DEPACON) 500 mg in sodium chloride 0.9 % 100 mL IVPB  500 mg Intravenous Continuous Ward Givens, NP   Stopped at 10/25/13 1156  . valproate (DEPACON) 500 mg in sodium chloride 0.9 % 100 mL IVPB  500 mg Intravenous Continuous Ward Givens, NP   Stopped at 11/03/13 0955    PAST MEDICAL HISTORY: Past Medical History  Diagnosis Date  . Hyperlipidemia   . Migraine headache   . GERD (gastroesophageal reflux disease)   . Occipital neuralgia     Left  . Chronic kidney disease     stage 3    PAST SURGICAL HISTORY: Past Surgical History  Procedure Laterality Date  . Abdominal hysterectomy    . Knee surgery    . Carpal tunnel release    . Elbow surgery    . Lung surgery    . Bladder repair    . Cervical spine surgery      FAMILY HISTORY: Family History  Problem Relation Age of Onset  . Cancer Mother 80    breast  . Alzheimer's disease Mother   . Cancer Brother   . Hyperlipidemia Brother       PHYSICAL EXAM  Filed Vitals:   05/08/14 0850  BP: 122/78  Pulse: 78  Height: 5\' 7"  (1.702 m)  Weight: 166 lb (75.297 kg)   Body mass index is 25.99 kg/(m^2).  Generalized: Well developed, in no acute distress   Neurological examination  Mentation: Alert oriented to time, place, history taking. Follows all commands speech and language fluent Cranial nerve II-XII: Pupils were equal round reactive to light. Extraocular movements were full, visual field were full on confrontational test. Facial strength were normal.Facial sensation decreased on the right. Uvula tongue midline. Head turning and shoulder shrug  were normal and symmetric. Motor: The motor testing reveals 5 over 5 strength of all 4 extremities except 4/5 strength in the right lower extremity. Good  symmetric motor tone is noted throughout.  Sensory: Sensory testing is intact to soft touch on all 4 extremities except decreased on the RUE and RLE. No evidence of extinction is noted.  Coordination: Cerebellar testing reveals good finger-nose-finger and heel-to-shin bilaterally.  Gait and station: Gait is normal. Tandem gait is normal. Romberg is negative. No drift is seen.  Reflexes: Deep tendon reflexes are symmetric and normal bilaterally.     DIAGNOSTIC DATA (LABS, IMAGING, TESTING) - I reviewed patient records, labs, notes, testing and imaging myself where available.   ASSESSMENT AND PLAN 64 y.o. year old female  has a past medical history of Hyperlipidemia; Migraine headache; GERD (gastroesophageal reflux  disease); Occipital neuralgia; and Chronic kidney disease. here with:  1. Migraine 2. RLE weakness  Patient's headache severity has improved. She continues to have a daily headache. I will increase Topamax to 100 mg BID. She will continue the tizanidine. If she is unable to tolerate the increase in medication, she was advised to let us know. Her right sided weakness as improved. She continues to have mild numbness on the right side. She has gotten good benefit with physical therapy. Patient was advised to let us know if her symptoms do not improve or worsen. She will F/U in 4-5 months with Dr. Jannifer Franklin.   Ward Givens, MSN, NP-C 05/08/2014, 8:55 AM [facility] [address] [place], [place] [address] [phone]  Note: This document was prepared with digital dictation and possible smart phrase technology. Any transcriptional errors that result from this process are unintentional.

## 2014-05-08 NOTE — Progress Notes (Signed)
I have read the note, and I agree with the clinical assessment and plan.  Blaise Grieshaber KEITH   

## 2014-05-09 ENCOUNTER — Encounter: Payer: Self-pay | Admitting: Physical Therapy

## 2014-05-09 ENCOUNTER — Ambulatory Visit: Payer: No Typology Code available for payment source | Attending: Family Medicine | Admitting: Physical Therapy

## 2014-05-09 DIAGNOSIS — R269 Unspecified abnormalities of gait and mobility: Secondary | ICD-10-CM | POA: Diagnosis not present

## 2014-05-09 DIAGNOSIS — Z5189 Encounter for other specified aftercare: Secondary | ICD-10-CM | POA: Insufficient documentation

## 2014-05-09 DIAGNOSIS — H8111 Benign paroxysmal vertigo, right ear: Secondary | ICD-10-CM | POA: Insufficient documentation

## 2014-05-09 DIAGNOSIS — R42 Dizziness and giddiness: Secondary | ICD-10-CM

## 2014-05-09 NOTE — Therapy (Signed)
[facility] [address] [place], [place], [address] Phone: [phone]   Fax:  [phone]  Physical Therapy Treatment  Patient Details  Name: [name] MRN: [id number] Date of Birth: [birth year] Referring Provider:  Elberta Leatherwood, MD  Encounter Date: 05/09/2014      PT End of Session - 05/09/14 2143    Visit Number 9   Number of Visits 13   Date for PT Re-Evaluation 06/02/14   Authorization Type [employer]   Authorization - Visit Number 30   PT Start Time 0800   PT Stop Time 0845   PT Time Calculation (min) 45 min      Past Medical History  Diagnosis Date  . Hyperlipidemia   . Migraine headache   . GERD (gastroesophageal reflux disease)   . Occipital neuralgia     Left  . Chronic kidney disease     stage 3    Past Surgical History  Procedure Laterality Date  . Abdominal hysterectomy    . Knee surgery    . Carpal tunnel release    . Elbow surgery    . Lung surgery    . Bladder repair    . Cervical spine surgery      There were no vitals taken for this visit.  Visit Diagnosis:  Dizziness and giddiness      Subjective Assessment - 05/09/14 2141    Symptoms Pt. states Dr. Jannifer Franklin was pleased with how she is doing; states she is feeling better; hopes to get more visits approved with new insurance   Pertinent History TIA's - 10-10-13;    Diagnostic tests MRI, CT WNLs per patient report.   Patient Stated Goals Resolve the vertigo   Currently in Pain? No/denies      Neuro Re-ed; vestibular stimulation exercises on compliant surface (blue mat) with marching with head turns both  Horizontal & vertical; standing in partial tandem stance with EO and EC with head turns with min assist to incr. Vestibular Input:  Alternate stepping on incline, decline and standing sideways with EO and EC and with head turns with CGA to  Min assist; picking up cones off floor, turning 180 degrees toward R and L sides   While standing on blue mat with CGA for habituation with turning;   Gait; amb. Making circles with ball - clockwise and counterclockwise - 120' x 1 rep each - for improved VOR and gaze  Stabilization; amb. 50' with quick turn around x 4 reps with CGA to min assist                        PT Short Term Goals - 05/04/14 1548    PT SHORT TERM GOAL #1   Title Improve DHI to </= 40% to demo improvement in vertigo   Baseline score 26/100 05-04-14   Time 4   Period Weeks   Status Achieved   PT SHORT TERM GOAL #2   Title Complete SOT - increase composite score by 10 points - to demo improvement in balance.   Baseline score 69/100 for SOT composite score on 05-04-14 ; initial score 56/100   Time 4   Period Weeks   Status Achieved   PT SHORT TERM GOAL #3   Title Independent in HEP for balance and gaze stabilization   Baseline target date 05-04-14   Time 4   Period Weeks   Status Achieved   PT SHORT TERM GOAL #4  Title Report at least 25% improvement in vertigo with sit to stand and with supine to sit.   Baseline partially met per pt. report - depends on speed of transfers   Time 4   Period Weeks   Status Partially Met           PT Long Term Goals - 05/05/14 1001    PT LONG TERM GOAL #1   Title Increase SOT score by at least 20 points for composite score to demo improved balance.   Baseline target date 06-04-14   Time 8   Period Weeks   Status New   PT LONG TERM GOAL #2   Title Improve DHI score to </= 25/100 to demo improvement in vertigo   Baseline target date 06-04-14   Time 8   Period Weeks   Status New   PT LONG TERM GOAL #3   Title Report at least 50% improvement in vertigo   Baseline target date 06-04-14   Time 8   Period Weeks   Status New   PT LONG TERM GOAL #4   Title Independent in updated HEP as appropriate    Baseline target date 06-04-14   Time 8   Period Weeks   Status New               Plan - 05/09/14 2143    Clinical  Impression Statement Pt. progressing well - reported vertigo intensity today 4/10 with vestibular activities of moderate intensity; vertigo returns to baseline with seated rest period   Pt will benefit from skilled therapeutic intervention in order to improve on the following deficits Abnormal gait;Decreased balance;Other (comment)   Rehab Potential Good   Clinical Impairments Affecting Rehab Potential migraines   PT Frequency 2x / week   PT Duration 4 weeks   PT Treatment/Interventions Therapeutic activities;Patient/family education;Therapeutic exercise;Gait training;Balance training;Neuromuscular re-education;Stair training   PT Next Visit Plan cont. toward LTG's   PT Home Exercise Plan gaze stabilization; balance on foam   Consulted and Agree with Plan of Care Patient        Problem List Patient Active Problem List   Diagnosis Date Noted  . URI (upper respiratory infection) 02/10/2014  . Migraine 01/03/2014  . Dizziness 11/09/2013  . Adjustment disorder with mixed anxiety and depressed mood 11/03/2013  . Migraine with status migrainosus 10/26/2013  . Migraine variant 10/10/2013  . Disturbance of skin sensation 10/10/2013  . UTI (lower urinary tract infection) 10/10/2013  . TIA (transient ischemic attack) 10/10/2013  . HTN (hypertension) 07/18/2013  . 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 Neuralgia, neuritis, and radiculitis, unspecified 02/04/2012  . Migraine without aura, with intractable migraine, so stated, without mention of status migrainosus 02/04/2012  . 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 Myofascial muscle pain 07/15/2011    Alda Lea, PT 05/09/2014, 9:46 PM  [facility] [address] [place], [place], [address] Phone: [phone]   Fax:  [phone]

## 2014-05-15 ENCOUNTER — Encounter: Payer: No Typology Code available for payment source | Attending: Physical Medicine & Rehabilitation

## 2014-05-15 ENCOUNTER — Ambulatory Visit (HOSPITAL_BASED_OUTPATIENT_CLINIC_OR_DEPARTMENT_OTHER): Payer: No Typology Code available for payment source | Admitting: Physical Medicine & Rehabilitation

## 2014-05-15 ENCOUNTER — Encounter: Payer: Self-pay | Admitting: Physical Medicine & Rehabilitation

## 2014-05-15 ENCOUNTER — Encounter: Payer: Self-pay | Admitting: Family Medicine

## 2014-05-15 ENCOUNTER — Ambulatory Visit (INDEPENDENT_AMBULATORY_CARE_PROVIDER_SITE_OTHER): Payer: No Typology Code available for payment source | Admitting: Family Medicine

## 2014-05-15 VITALS — BP 122/78 | HR 76 | Resp 14

## 2014-05-15 VITALS — BP 126/88 | HR 72 | Temp 98.3°F | Ht 67.0 in | Wt 166.5 lb

## 2014-05-15 DIAGNOSIS — G459 Transient cerebral ischemic attack, unspecified: Secondary | ICD-10-CM | POA: Insufficient documentation

## 2014-05-15 DIAGNOSIS — G43011 Migraine without aura, intractable, with status migrainosus: Secondary | ICD-10-CM | POA: Insufficient documentation

## 2014-05-15 DIAGNOSIS — G8912 Acute post-thoracotomy pain: Secondary | ICD-10-CM | POA: Diagnosis not present

## 2014-05-15 DIAGNOSIS — G548 Other nerve root and plexus disorders: Secondary | ICD-10-CM | POA: Diagnosis not present

## 2014-05-15 DIAGNOSIS — G588 Other specified mononeuropathies: Secondary | ICD-10-CM

## 2014-05-15 MED ORDER — LIDOCAINE 5 % EX PTCH: 2.0000 | MEDICATED_PATCH | CUTANEOUS | Status: DC

## 2014-05-15 MED ORDER — GABAPENTIN 300 MG PO CAPS: 300.0000 mg | ORAL_CAPSULE | Freq: Every day | ORAL | Status: DC

## 2014-05-15 MED ORDER — GABAPENTIN 300 MG PO CAPS: 300.0000 mg | ORAL_CAPSULE | Freq: Two times a day (BID) | ORAL | Status: DC

## 2014-05-15 NOTE — Progress Notes (Signed)
Subjective:    Patient ID: [name], female    DOB: 04-10-1950, 64 y.o.   MRN: [id number]  HPI Currently out of work since August 2015 TIA Undergoing PT for BPPV Patient working on gaze stabilization exercises at home as well as balance exercises on a foam cushion using a exercise ball  Using lidocaine patch over the rib 2 patches on in the morning and off in the evening  Using gabapentin 300 mg twice a day, while she is working she did not tolerate the sedation effects during the day and took it just at night Pain Inventory Average Pain 7 Pain Right Now 7 My pain is sharp, dull, stabbing and aching  In the last 24 hours, has pain interfered with the following? General activity 7 Relation with others 7 Enjoyment of life 7 What TIME of day is your pain at its worst? daytime and evening Sleep (in general) Poor  Pain is worse with: walking, bending, sitting and standing Pain improves with: rest, heat/ice and medication Relief from Meds: 5  Mobility walk without assistance how many minutes can you walk? 45 ability to climb steps?  yes do you drive?  yes  Function disabled: date disabled 03/10/2014  Neuro/Psych weakness dizziness depression  Prior Studies Any changes since last visit?  yes stroke 10/2013  Physicians involved in your care Neurologist Floyde Parkins   Family History  Problem Relation Age of Onset  . Cancer Mother 38    breast  . Alzheimer's disease Mother   . Cancer Brother   . Hyperlipidemia Brother    History   Social History  . Marital Status: Married    Spouse Name: [name]  . Number of Children: 1  . Years of Education: 12+   Occupational History  . ACTIVITY DIRECTOR   . CNA    Social History Main Topics  . Smoking status: Former Smoker -- 0.30 packs/day    Types: Cigarettes    Quit date: 11/01/2011  . Smokeless tobacco: Never Used  . Alcohol Use: No  . Drug Use: No  . Sexual Activity:    Partners: Male    Birth Control/  Protection: Surgical     Comment: 1 sexual partner in last 12 months: married   Other Topics Concern  . None   Social History Narrative   Patient is married ([name]).   Patient has one child.   Patient is working full-time.   Patient is right handed.   Patient has a college education.   Patient drinks 6-8 cups of coffee daily..   Past Surgical History  Procedure Laterality Date  . Abdominal hysterectomy    . Knee surgery    . Carpal tunnel release    . Elbow surgery    . Lung surgery    . Bladder repair    . Cervical spine surgery     Past Medical History  Diagnosis Date  . Hyperlipidemia   . Migraine headache   . GERD (gastroesophageal reflux disease)   . Occipital neuralgia     Left  . Chronic kidney disease     stage 3  . Stroke 10/2013   BP 122/78 mmHg  Pulse 76  Resp 14  SpO2 98%  Opioid Risk Score:   Fall Risk Score: High Fall Risk (>13 points) (previously educated but refused handout on fall prevention in home)  Review of Systems  Neurological: Positive for dizziness and weakness.  Psychiatric/Behavioral: Positive for dysphoric mood.  All other systems reviewed  and are negative.      Objective:   Physical Exam  Constitutional: She is oriented to person, place, and time. She appears well-developed and well-nourished.  HENT:  Head: Normocephalic and atraumatic.  Musculoskeletal:  Tenderness in Left lateral ribs at mid axillary line with radiation under the Left breast  Neurological: She is alert and oriented to person, place, and time.  Psychiatric: She has a normal mood and affect.  Nursing note and vitals reviewed.  No skin lesions in the left rib area, healed thoracotomy scar  No pain with thoracic movement     Assessment & Plan:  1. 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

## 2014-05-15 NOTE — Progress Notes (Signed)
   HPI  Patient presents today for follow-up from your rehabilitation. Patient has been undergoing neuro rehabilitation and has had a dramatic improvement in symptoms since that time. Patient continues to progress each time I see her. I believe that these rehabilitation sessions have had a significantly positive impact on patient's well-being/morale/daily life. Patient states that she continues to have some symptoms on a daily basis. These have dramatically improved over time. No new symptoms or issues to report at today's visit.  Of note: Patient brought up her husband again today. This is the second visit in a row the patient has brought him up in a slightly negative manner. It seems as though patient is frustrated with his current health status. It seems as though she feels her husband is "being lazy" and not doing enough/anything to improve his health.  Smoking status noted ROS: Per HPI  Objective: BP 126/88 mmHg  Pulse 72  Temp(Src) 98.3 F (36.8 C) (Oral)  Ht 5\' 7"  (1.702 m)  Wt 166 lb 8 oz (75.524 kg)  BMI 26.07 kg/m2 Gen: NAD, alert, cooperative with exam HEENT: NCAT, EOMI, PERRL CV: RRR, good S1/S2, no murmur Resp: CTABL, no wheezes, non-labored Abd: SNTND, BS present, no guarding or organomegaly Ext: No edema, warm Neuro: Alert and oriented, sensation diminished on entire right side compared to left. Strength 5/5 on left side, strength 4/5 on right side (throughout all muscle groups). Rapid alternating movements diminished on right side when compared to left. Right-sided heel-to-shin diminished on right when compared to left. Gait slightly abnormal. Heel to toe walk normal >> this has never been normal in the past. Toe walk and heel walk both normal.  Assessment and plan:  TIA (transient ischemic attack) Patient here for follow-up after sessions of neuro rehabilitation. Rehabilitation sessions are going very well. And patient continues to show signs of improvement. Continues to  progress. Right-sided strength still diminished compared to left. Right-sided coordination and repetitive movements also diminished when compared to left. With that said, she is significantly improved on the right side compared to my findings at her initial visit with me.  - I have written another referral to neuro rehabilitation. Patient's previous referral is about to run out and a new referral was necessary at this time. I will continue to provide these referrals as long as patient continues to improve/progress from her neuro rehabilitation sessions.     Orders Placed This Encounter  Procedures  . Ambulatory Referral to Neuro Rehab    Referral Priority:  Routine    Referral Type:  Consultation    Number of Visits Requested:  1    No orders of the defined types were placed in this encounter.     Elberta Leatherwood, MD,MS,  PGY1 05/15/2014 5:56 PM

## 2014-05-15 NOTE — Patient Instructions (Signed)
It was a pleasure seeing you today in our clinic. Today we discussed your neuro rehab. Here is the treatment plan we have discussed and agreed upon together:   - I love the progress you have made with the help of your Neuro Rehab! I have reordered this so you can continue this treatment and continue to show progress. - I discussed with you the need to undergo another colonoscopy. According to our records you are past due so I would like it if you had an appointment to have this done lined up by our next visit.  -Keep up the great work!

## 2014-05-15 NOTE — Assessment & Plan Note (Signed)
Patient here for follow-up after sessions of neuro rehabilitation. Rehabilitation sessions are going very well. And patient continues to show signs of improvement. Continues to progress. Right-sided strength still diminished compared to left. Right-sided coordination and repetitive movements also diminished when compared to left. With that said, she is significantly improved on the right side compared to my findings at her initial visit with me.  - I have written another referral to neuro rehabilitation. Patient's previous referral is about to run out and a new referral was necessary at this time. I will continue to provide these referrals as long as patient continues to improve/progress from her neuro rehabilitation sessions.

## 2014-05-21 ENCOUNTER — Other Ambulatory Visit: Payer: Self-pay | Admitting: Family Medicine

## 2014-05-28 ENCOUNTER — Other Ambulatory Visit: Payer: Self-pay | Admitting: Adult Health

## 2014-06-01 ENCOUNTER — Ambulatory Visit: Payer: No Typology Code available for payment source | Admitting: Physical Therapy

## 2014-06-01 ENCOUNTER — Encounter: Payer: Self-pay | Admitting: Physical Therapy

## 2014-06-01 DIAGNOSIS — R269 Unspecified abnormalities of gait and mobility: Secondary | ICD-10-CM

## 2014-06-01 DIAGNOSIS — Z5189 Encounter for other specified aftercare: Secondary | ICD-10-CM | POA: Diagnosis not present

## 2014-06-01 DIAGNOSIS — R42 Dizziness and giddiness: Secondary | ICD-10-CM

## 2014-06-01 NOTE — Therapy (Signed)
[facility] [address] [place], [place], [address] Phone: [phone]   Fax:  [phone]  Physical Therapy Treatment  Patient Details  Name: [name] MRN: [id number] Date of Birth: 25-Nov-1950 Referring Provider:  Elberta Leatherwood, MD  Encounter Date: 06/01/2014      PT End of Session - 06/01/14 1511    Visit Number 10   Number of Visits 14   Date for PT Re-Evaluation 07/02/14   Authorization Type coventry   PT Start Time 0931   PT Stop Time 1016   PT Time Calculation (min) 45 min      Past Medical History  Diagnosis Date  . Hyperlipidemia   . Migraine headache   . GERD (gastroesophageal reflux disease)   . Occipital neuralgia     Left  . Chronic kidney disease     stage 3  . Stroke 10/2013    Past Surgical History  Procedure Laterality Date  . Abdominal hysterectomy    . Knee surgery    . Carpal tunnel release    . Elbow surgery    . Lung surgery    . Bladder repair    . Cervical spine surgery      There were no vitals filed for this visit.  Visit Diagnosis:  Dizziness and giddiness  Abnormality of gait      Subjective Assessment - 06/01/14 1505    Symptoms Pt. rates vertigo 2/10 intensity; worse with leaning over to pick up something off floor and with turning to left side   Pertinent History TIA's - 10-10-13;    Diagnostic tests MRI, CT WNLs per patient report.   Patient Stated Goals Resolve the vertigo   Currently in Pain? No/denies                            Balance Exercises - 06/01/14 1505    Balance Exercises: Standing   Standing Eyes Opened Foam/compliant surface;Narrow base of support (BOS);Wide (BOA);20 secs   Standing Eyes Closed Wide (BOA);Foam/compliant surface;2 reps;10 secs  on incline and on decline   Retro Gait 2 reps  10'; tossing ball   Turning Left;Right;5 reps  sit to stand with pivot turn   Cone Rotation Foam/compliant surface  with  CGA; cone taps and tipping over and up   Other Standing Exercises Figure 8's with ball between legs - 180 degree turn on mat -   vertigo increased to 5/10 intensity     Neuro re-ed: sit to stand x 5 reps on floor; sit to stand x 5 reps on foam; stand pivot x 5 reps to left side, then 5 reps to Right side;  Marching in place on mat on floor - with EC and with head turns; EO with targets with head turns Alternate stepping on incline with CGA; seated diagonal reaching - down to left side and turn up toward right side - vertigo  Reported at 5/10 intensity      PT Education - 06/01/14 1510    Education provided Yes   Education Details instructed pt to begin walking program   Person(s) Educated Patient   Methods Explanation   Comprehension Verbalized understanding          PT Short Term Goals - 06/01/14 1522    PT SHORT TERM GOAL #4   Title Report at least 25% improvement in vertigo with sit to stand and with supine to sit.  Time 4   Period Weeks   Status On-going           PT Long Term Goals - 06/01/14 1522    PT LONG TERM GOAL #1   Title Increase SOT score by at least 20 points for composite score to demo improved balance.   Baseline target date 07-02-14   Time 4   Period Weeks   Status On-going   PT LONG TERM GOAL #2   Title Improve DHI score to </= 25/100 to demo improvement in vertigo   Baseline target date 07-02-14   Time 4   Period Weeks   Status On-going   PT LONG TERM GOAL #3   Title Report at least 50% improvement in vertigo   Baseline target date 07-02-14   Time 4   Period Weeks   Status On-going   PT LONG TERM GOAL #4   Title Independent in updated HEP as appropriate    Baseline target date 07-02-14   Time 4   Period Weeks   Status On-going               Plan - 06/01/14 1512    Clinical Impression Statement Pt. reported vertigo at intensity 3/10 initially prior to treatment and increased to 5/10 with some exercises, especially with turning  toward left side   Pt will benefit from skilled therapeutic intervention in order to improve on the following deficits Abnormal gait;Decreased balance;Other (comment)   Rehab Potential Good   Clinical Impairments Affecting Rehab Potential migraines   PT Frequency 1x / week   PT Duration 4 weeks   PT Treatment/Interventions Therapeutic activities;Patient/family education;Therapeutic exercise;Gait training;Balance training;Neuromuscular re-education;Stair training   PT Next Visit Plan extend LTG's til 07-02-14 due to pt. has not been seen in PT since 05-09-14 due to awaiting authorization and lack of availability for PT appt.   PT Home Exercise Plan gaze stabilization; balance on foam   Consulted and Agree with Plan of Care Patient        Problem List Patient Active Problem List   Diagnosis Date Noted  . URI (upper respiratory infection) 02/10/2014  . Migraine 01/03/2014  . Dizziness 11/09/2013  . Adjustment disorder with mixed anxiety and depressed mood 11/03/2013  . Migraine with status migrainosus 10/26/2013  . Migraine variant 10/10/2013  . Disturbance of skin sensation 10/10/2013  . UTI (lower urinary tract infection) 10/10/2013  . TIA (transient ischemic attack) 10/10/2013  . HTN (hypertension) 07/18/2013  . 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 Neuralgia, neuritis, and radiculitis, unspecified 02/04/2012  . Intractable migraine without aura 02/04/2012  . 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 Myofascial muscle pain 07/15/2011    Alda Lea, PT 06/01/2014, 3:25 PM  [facility] [address] [place], [place], [address] Phone: [phone]   Fax:  [phone]

## 2014-06-19 ENCOUNTER — Ambulatory Visit: Payer: No Typology Code available for payment source | Admitting: Physical Therapy

## 2014-06-26 ENCOUNTER — Ambulatory Visit: Payer: No Typology Code available for payment source | Attending: Family Medicine | Admitting: Physical Therapy

## 2014-06-26 DIAGNOSIS — Z5189 Encounter for other specified aftercare: Secondary | ICD-10-CM | POA: Insufficient documentation

## 2014-06-26 DIAGNOSIS — H8111 Benign paroxysmal vertigo, right ear: Secondary | ICD-10-CM | POA: Insufficient documentation

## 2014-06-26 DIAGNOSIS — R269 Unspecified abnormalities of gait and mobility: Secondary | ICD-10-CM | POA: Diagnosis not present

## 2014-06-26 DIAGNOSIS — R42 Dizziness and giddiness: Secondary | ICD-10-CM

## 2014-06-27 ENCOUNTER — Encounter: Payer: Self-pay | Admitting: Physical Therapy

## 2014-06-27 NOTE — Therapy (Signed)
[facility] [address] [place], [place], [address] Phone: [phone]   Fax:  [phone]  Physical Therapy Treatment  Patient Details  Name: [name] MRN: [id number] Date of Birth: 12/04/1950 Referring Provider:  Elberta Leatherwood, MD  Encounter Date: 06/26/2014      PT End of Session - 06/27/14 1035    Visit Number 11   Number of Visits 14   Date for PT Re-Evaluation 07/02/14   Authorization Type coventry   Authorization - Visit Number 30   PT Start Time 1016   PT Stop Time 1101   PT Time Calculation (min) 45 min      Past Medical History  Diagnosis Date  . Hyperlipidemia   . Migraine headache   . GERD (gastroesophageal reflux disease)   . Occipital neuralgia     Left  . Chronic kidney disease     stage 3  . Stroke 10/2013    Past Surgical History  Procedure Laterality Date  . Abdominal hysterectomy    . Knee surgery    . Carpal tunnel release    . Elbow surgery    . Lung surgery    . Bladder repair    . Cervical spine surgery      There were no vitals filed for this visit.  Visit Diagnosis:  Dizziness and giddiness  Abnormality of gait      Subjective Assessment - 06/27/14 1023    Subjective Pt. reports having increased vertigo last week when assisting with kitchen and food activities required due to death in family; stated she was able to control the vertigo by holding onto shelf and taking time for vertigo to decrease   Pertinent History TIA's - 10-10-13;    Diagnostic tests MRI, CT WNLs per patient report.   Patient Stated Goals Resolve the vertigo   Currently in Pain? No/denies                              Balance Exercises - 06/27/14 1033    Balance Exercises: Standing   Standing Eyes Opened Foam/compliant surface;Narrow base of support (BOS);Wide (BOA);20 secs   Standing Eyes Closed Wide (BOA);Foam/compliant surface;2 reps;10 secs  on incline and on  decline   Tandem Stance Eyes open;Foam/compliant surface  with min assist   Tandem Gait Forward;2 reps;Foam/compliant surface  with CGA   Retro Gait 2 reps  10'; tossing ball   Turning Left;Right;5 reps  sit to stand with pivot turn   Cone Rotation Foam/compliant surface  with CGA; cone taps and tipping over and up   Other Standing Exercises Figure 8's with ball between legs - 180 degree turn on mat -   vertigo increased to 5/10 intensity             PT Short Term Goals - 06/01/14 1522    PT SHORT TERM GOAL #4   Title Report at least 25% improvement in vertigo with sit to stand and with supine to sit.   Time 4   Period Weeks   Status On-going           PT Long Term Goals - 06/01/14 1522    PT LONG TERM GOAL #1   Title Increase SOT score by at least 20 points for composite score to demo improved balance.   Baseline target date 07-02-14   Time 4   Period Weeks   Status On-going  PT LONG TERM GOAL #2   Title Improve DHI score to </= 25/100 to demo improvement in vertigo   Baseline target date 07-02-14   Time 4   Period Weeks   Status On-going   PT LONG TERM GOAL #3   Title Report at least 50% improvement in vertigo   Baseline target date 07-02-14   Time 4   Period Weeks   Status On-going   PT LONG TERM GOAL #4   Title Independent in updated HEP as appropriate    Baseline target date 07-02-14   Time 4   Period Weeks   Status On-going               Plan - 06/27/14 1037    Clinical Impression Statement Pt reports vertigo intensity 4-5/10 with turning toward right side; settles down to 3-4/10 intensity with seated rest period   Pt will benefit from skilled therapeutic intervention in order to improve on the following deficits Abnormal gait;Decreased balance;Other (comment)   Rehab Potential Good   Clinical Impairments Affecting Rehab Potential migraines   PT Frequency 1x / week   PT Duration 4 weeks   PT Treatment/Interventions Therapeutic  activities;Patient/family education;Therapeutic exercise;Gait training;Balance training;Neuromuscular re-education;Stair training   PT Next Visit Plan extend LTG's til 07-02-14 due to pt. has not been seen in PT since 05-09-14 due to awaiting authorization and lack of availability for PT appt.   PT Home Exercise Plan gaze stabilization; balance on foam - previously instructed   Consulted and Agree with Plan of Care Patient        Problem List Patient Active Problem List   Diagnosis Date Noted  . URI (upper respiratory infection) 02/10/2014  . Migraine 01/03/2014  . Dizziness 11/09/2013  . Adjustment disorder with mixed anxiety and depressed mood 11/03/2013  . Migraine with status migrainosus 10/26/2013  . Migraine variant 10/10/2013  . Disturbance of skin sensation 10/10/2013  . UTI (lower urinary tract infection) 10/10/2013  . TIA (transient ischemic attack) 10/10/2013  . HTN (hypertension) 07/18/2013  . 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 Neuralgia, neuritis, and radiculitis, unspecified 02/04/2012  . Intractable migraine without aura 02/04/2012  . 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 Myofascial muscle pain 07/15/2011    Alda Lea, PT 06/27/2014, 10:40 AM  [facility] [address] [place], [place], [address] Phone: [phone]   Fax:  [phone]

## 2014-07-03 ENCOUNTER — Encounter: Payer: Self-pay | Admitting: Physical Therapy

## 2014-07-03 ENCOUNTER — Ambulatory Visit: Payer: No Typology Code available for payment source | Admitting: Physical Therapy

## 2014-07-03 DIAGNOSIS — Z5189 Encounter for other specified aftercare: Secondary | ICD-10-CM | POA: Diagnosis not present

## 2014-07-03 DIAGNOSIS — R269 Unspecified abnormalities of gait and mobility: Secondary | ICD-10-CM

## 2014-07-03 DIAGNOSIS — R42 Dizziness and giddiness: Secondary | ICD-10-CM

## 2014-07-03 NOTE — Therapy (Signed)
[facility] [address] [place], [place], [address] Phone: [phone]   Fax:  [phone]  Physical Therapy Treatment  Patient Details  Name: [name] MRN: [id number] Date of Birth: [birth year] Referring Provider:  Elberta Leatherwood, MD  Encounter Date: 07/03/2014      PT End of Session - 07/03/14 1438    Visit Number 12   Number of Visits 14   Date for PT Re-Evaluation 07/10/14   Authorization Type coventry   Authorization - Visit Number 30   PT Start Time 1016   PT Stop Time 1101   PT Time Calculation (min) 45 min      Past Medical History  Diagnosis Date  . Hyperlipidemia   . Migraine headache   . GERD (gastroesophageal reflux disease)   . Occipital neuralgia     Left  . Chronic kidney disease     stage 3  . Stroke 10/2013    Past Surgical History  Procedure Laterality Date  . Abdominal hysterectomy    . Knee surgery    . Carpal tunnel release    . Elbow surgery    . Lung surgery    . Bladder repair    . Cervical spine surgery      There were no vitals filed for this visit.  Visit Diagnosis:  Dizziness and giddiness  Abnormality of gait      Subjective Assessment - 07/03/14 1431    Subjective Pt. reports having vertigo when turning toward left side and with bending over and returning to upright position   Pertinent History TIA's - 10-10-13;    Diagnostic tests MRI, CT WNLs per patient report.   Patient Stated Goals Resolve the vertigo   Currently in Pain? No/denies                              Balance Exercises - 07/03/14 1432    Balance Exercises: Standing   Standing Eyes Opened Foam/compliant surface;Narrow base of support (BOS);Wide (BOA);20 secs   Standing Eyes Closed Wide (BOA);Foam/compliant surface;2 reps;10 secs  on incline and on decline   Tandem Stance Eyes open;Foam/compliant surface  with min assist   Retro Gait 2 reps  10'; tossing ball     NeuroRe-ed; Habituation exercises - bouncing on blue swiss ball - with head turns horizontal and vertical with EO and EC with CGA; Reaching down to floor toward left side - back up with rotation toward right side - 10 reps; then toward right side with rotation toward left side Sit to stand with turning toward right and left sides x 5 reps each; sit to stand 180 degree turn to R and L sides with CGA; Alternate stepping on mat on incline/ decline with CGA ; marching in place on mat on incline with CGA with head turns and  With EO/EC; picking up cones off floor with 180 degree turn toward L side to place in cabinet - 5 reps each and then back down  Onto floor with CGA to min assist Pt reported vertigo intensity 4- 4 1/2 intensity but states that it settles down to approx. 3/10 intensity with seated rest period         PT Short Term Goals - 06/01/14 1522    PT SHORT TERM GOAL #4   Title Report at least 25% improvement in vertigo with sit to stand and with supine to sit.   Time 4  Period Weeks   Status On-going           PT Long Term Goals - 06/01/14 1522    PT LONG TERM GOAL #1   Title Increase SOT score by at least 20 points for composite score to demo improved balance.   Baseline target date 07-02-14   Time 4   Period Weeks   Status On-going   PT LONG TERM GOAL #2   Title Improve DHI score to </= 25/100 to demo improvement in vertigo   Baseline target date 07-02-14   Time 4   Period Weeks   Status On-going   PT LONG TERM GOAL #3   Title Report at least 50% improvement in vertigo   Baseline target date 07-02-14   Time 4   Period Weeks   Status On-going   PT LONG TERM GOAL #4   Title Independent in updated HEP as appropriate    Baseline target date 07-02-14   Time 4   Period Weeks   Status On-going               Plan - 07/03/14 1450    Clinical Impression Statement Pt. reports vertigo 3/10 intensity prior to PT session - continues to have incr. vertigo with  bending over and with quick turns toward left side   Pt will benefit from skilled therapeutic intervention in order to improve on the following deficits Abnormal gait;Decreased balance;Other (comment)   Rehab Potential Good   Clinical Impairments Affecting Rehab Potential migraines   PT Frequency 1x / week   PT Duration 4 weeks   PT Treatment/Interventions Therapeutic activities;Patient/family education;Therapeutic exercise;Gait training;Balance training;Neuromuscular re-education;Stair training   PT Next Visit Plan extend LTG's til 07-17-14 due to pt. has not been seen in PT since 05-09-14 due to awaiting authorization and lack of availability for PT appt.   PT Home Exercise Plan gaze stabilization; balance on foam - previously instructed   Consulted and Agree with Plan of Care Patient        Problem List Patient Active Problem List   Diagnosis Date Noted  . URI (upper respiratory infection) 02/10/2014  . Migraine 01/03/2014  . Dizziness 11/09/2013  . Adjustment disorder with mixed anxiety and depressed mood 11/03/2013  . Migraine with status migrainosus 10/26/2013  . Migraine variant 10/10/2013  . Disturbance of skin sensation 10/10/2013  . UTI (lower urinary tract infection) 10/10/2013  . TIA (transient ischemic attack) 10/10/2013  . HTN (hypertension) 07/18/2013  . 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 Neuralgia, neuritis, and radiculitis, unspecified 02/04/2012  . Intractable migraine without aura 02/04/2012  . 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 Myofascial muscle pain 07/15/2011    Alda Lea, PT 07/03/2014, 2:53 PM  [facility] [address] [place], [place], [address] Phone: [phone]   Fax:  [phone]

## 2014-07-10 ENCOUNTER — Ambulatory Visit: Payer: No Typology Code available for payment source | Attending: Family Medicine | Admitting: Physical Therapy

## 2014-07-10 DIAGNOSIS — R42 Dizziness and giddiness: Secondary | ICD-10-CM

## 2014-07-10 DIAGNOSIS — R269 Unspecified abnormalities of gait and mobility: Secondary | ICD-10-CM | POA: Diagnosis not present

## 2014-07-10 DIAGNOSIS — Z5189 Encounter for other specified aftercare: Secondary | ICD-10-CM | POA: Insufficient documentation

## 2014-07-10 DIAGNOSIS — H8111 Benign paroxysmal vertigo, right ear: Secondary | ICD-10-CM | POA: Insufficient documentation

## 2014-07-11 ENCOUNTER — Other Ambulatory Visit: Payer: Self-pay | Admitting: Adult Health

## 2014-07-11 ENCOUNTER — Encounter: Payer: Self-pay | Admitting: Physical Therapy

## 2014-07-11 NOTE — Therapy (Signed)
[facility] [address] [place], [place], [address] Phone: [phone]   Fax:  [phone]  Physical Therapy Treatment  Patient Details  Name: [name] MRN: [id number] Date of Birth: [birth year] Referring Provider:  Elberta Leatherwood, MD  Encounter Date: 07/10/2014      PT End of Session - 07/11/14 2040    Visit Number 13   Number of Visits 14   Date for PT Re-Evaluation 07/10/14   Authorization Type coventry   Authorization - Visit Number 30   PT Start Time 2993   PT Stop Time 1103   PT Time Calculation (min) 46 min      Past Medical History  Diagnosis Date  . Hyperlipidemia   . Migraine headache   . GERD (gastroesophageal reflux disease)   . Occipital neuralgia     Left  . Chronic kidney disease     stage 3  . Stroke 10/2013    Past Surgical History  Procedure Laterality Date  . Abdominal hysterectomy    . Knee surgery    . Carpal tunnel release    . Elbow surgery    . Lung surgery    . Bladder repair    . Cervical spine surgery      There were no vitals filed for this visit.  Visit Diagnosis:  Dizziness and giddiness  Abnormality of gait      Subjective Assessment - 07/11/14 2034    Subjective "Today is a bad day" Pt attributes vertigoto change in weather (rainy today); rates intensity 5/10   Pertinent History TIA's - 10-10-13;    Diagnostic tests MRI, CT WNLs per patient report.   Patient Stated Goals Resolve the vertigo   Currently in Pain? No/denies      NeuroRe-ed; vestibular stimulation exercises on foam mats - standing with feet together and with head turns with EO and  With EC; gaze stabilization - tracking ball while standing on mat with feet together with CGA; picking up cones off floor While standing on mat - 180 degree turns to place in cabinet - 6 cones- CGA Seated - diagonal reaching to right and left sides x 5 reps each; sit to stand with 180 degree turn to rigth and  left sides X 3 reps each Marching and alternate stepping on incline/decline with min to Arthur                           PT Education - 07/11/14 2039    Education provided Yes   Education Details reviewed HEP   Person(s) Educated Patient   Methods Explanation;Demonstration   Comprehension Verbalized understanding          PT Short Term Goals - 06/01/14 1522    PT SHORT TERM GOAL #4   Title Report at least 25% improvement in vertigo with sit to stand and with supine to sit.   Time 4   Period Weeks   Status On-going           PT Long Term Goals - 07/11/14 2045    PT LONG TERM GOAL #1   Title Increase SOT score by at least 20 points for composite score to demo improved balance.   Status Deferred   PT LONG TERM GOAL #2   Title Improve DHI score to </= 25/100 to demo improvement in vertigo   Status Deferred   PT LONG TERM GOAL #3   Title Report at  least 50% improvement in vertigo   Baseline inconsistent intensity reported   Status Partially Met   PT LONG TERM GOAL #4   Title Independent in updated HEP as appropriate    Status Achieved               Plan - 07/11/14 2042    Clinical Impression Statement Pt. reporting incr. vertigo today due to weather changes; cont to have incr. vertigo with turning to right side; pt. appears to be plateauing in maximizing functional progress   Pt will benefit from skilled therapeutic intervention in order to improve on the following deficits Abnormal gait;Decreased balance;Other (comment)   Rehab Potential Good   Clinical Impairments Affecting Rehab Potential migraines   PT Treatment/Interventions Therapeutic activities;Patient/family education;Therapeutic exercise;Gait training;Balance training;Neuromuscular re-education;Stair training   PT Next Visit Plan D/C per pt. request - financial concerns with insurance   PT Home Exercise Plan gaze stabilization; balance on foam - previously instructed   Consulted and  Agree with Plan of Care Patient        Problem List Patient Active Problem List   Diagnosis Date Noted  . URI (upper respiratory infection) 02/10/2014  . Migraine 01/03/2014  . Dizziness 11/09/2013  . Adjustment disorder with mixed anxiety and depressed mood 11/03/2013  . Migraine with status migrainosus 10/26/2013  . Migraine variant 10/10/2013  . Disturbance of skin sensation 10/10/2013  . UTI (lower urinary tract infection) 10/10/2013  . TIA (transient ischemic attack) 10/10/2013  . HTN (hypertension) 07/18/2013  . 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 Neuralgia, neuritis, and radiculitis, unspecified 02/04/2012  . Intractable migraine without aura 02/04/2012  . 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 Myofascial muscle pain 07/15/2011  PHYSICAL THERAPY DISCHARGE SUMMARY  Visits from Start of Care: 13  Current functional level related to goals / functional outcomes: See above for progress towards LTG's - early D/C requested due to financial concerns with insurance co. So LTG's #1 and #2 deferred due to program not fully completed   Remaining deficits: Cont. C/o vertigo which varies in intensity per pt. report   Education / Equipment: Instructed in Medical illustrator exercises for HEP  Plan: Patient agrees to discharge.  Patient goals were partially met. Patient is being discharged due to financial reasons.  ?????  and also due to pt's request.      Lesa Vandall, [place], PT 07/11/2014, 8:49 PM  [facility] [address] [place], [place], [address] Phone: [phone]   Fax:  [phone]

## 2014-08-09 ENCOUNTER — Encounter: Payer: Self-pay | Admitting: Family Medicine

## 2014-08-09 ENCOUNTER — Ambulatory Visit (INDEPENDENT_AMBULATORY_CARE_PROVIDER_SITE_OTHER): Payer: No Typology Code available for payment source | Admitting: Family Medicine

## 2014-08-09 VITALS — BP 129/84 | HR 102 | Temp 98.3°F | Ht 67.0 in | Wt 162.7 lb

## 2014-08-09 DIAGNOSIS — G459 Transient cerebral ischemic attack, unspecified: Secondary | ICD-10-CM

## 2014-08-09 MED ORDER — CLOPIDOGREL BISULFATE 75 MG PO TABS: 75.0000 mg | ORAL_TABLET | Freq: Every day | ORAL | Status: DC

## 2014-08-09 NOTE — Progress Notes (Signed)
Patient is due for a screening mammogram.  Information on imaging center given to patient to call and schedule mammogram appointment.  Fleeger, Jessica Dawn, CMA   

## 2014-08-09 NOTE — Patient Instructions (Addendum)
It was a pleasure seeing you today in our clinic. Today we discussed your migraines and gait instability. Here is the treatment plan we have discussed and agreed upon together:   - I know you have been upset about not having the ability to get to Neuro Rehab. The fact that you are continuing to do these exercises at home is great! Keep it up! Hopefully we will turn the corner in some of these problems soon, but in the mean time, stay positive! - We were able to complete the Work Status Sheet today. - I refilled your Plavix - I would also like for you to try to stop taking your Nexium from this point on. If you begin to develop problems with this we can try a different medication.

## 2014-08-14 NOTE — Progress Notes (Signed)
   HPI  CC: f/u neuro rehab Patient presents for follow-up on her neuro rehab and balance issues. She reports she is now on disability. She has not been able to continue her Neuro PT due to her insurance company will no longer pay for it. She is doing her exercises at home the best she can. She feels as though she is not making the progress she once was when she was going to formal sessions.   Dizziness and coordination issues persist. She reported one episode in which she lost her balance in the garden and her husband had witnessed this. Otherwise is seems that overall these episodes are less frequent. Denied n/v/d, CP, SOB, appetite changes, fever, chills, confusion, dysphagia. Endorses some mild short-term memory loss (car keys and chores)  Review of Systems   See HPI for ROS. All other systems reviewed and are negative.  Past medical history and social history reviewed and updated in the EMR as appropriate.  Objective: BP 129/84 mmHg  Pulse 102  Temp(Src) 98.3 F (36.8 C) (Oral)  Ht 5\' 7"  (1.702 m)  Wt 162 lb 11.2 oz (73.8 kg)  BMI 25.48 kg/m2 en: NAD, alert, cooperative with exam HEENT: NCAT, EOMI, PERRL CV: RRR, good S1/S2, no murmur Resp: CTABL, no wheezes, non-labored Abd: SNTND, BS present, no guarding or organomegaly Ext: No edema, warm Neuro: Alert and oriented, sensation diminished on entire right side compared to left. Strength 5/5 on left side, strength 4/5 on right side (throughout all muscle groups). Rapid alternating movements diminished on right side when compared to left. Right-sided heel-to-shin diminished on right when compared to left. Gait slightly abnormal. Toe walk and heel walk both normal.  Assessment and plan:  TIA (transient ischemic attack) Patient no longer able to go to neuro rehab 2/2 insurance coverage. I encouraged her to continue these exercises at home. She agrees that it is important to continue this even w/o formal instruction. No significant  changes at this time. Patient needed a refill on her Plavix.  - I will continue to watch patient's progress. - At last visit i recommended patient look into outside sources of "rehab" I forgot to bring this up to her at this visit but I believe this will likely be the best resource for her in the future if formal PT is not an option. I will make a note to have this discussion w/ her in the future.     No orders of the defined types were placed in this encounter.    Meds ordered this encounter  Medications  . clopidogrel (PLAVIX) 75 MG tablet    Sig: Take 1 tablet (75 mg total) by mouth daily.    Dispense:  30 tablet    Refill:  11     Elberta Leatherwood, MD,MS,  PGY1 08/14/2014 12:34 PM

## 2014-08-14 NOTE — Assessment & Plan Note (Addendum)
Patient no longer able to go to neuro rehab 2/2 insurance coverage. I encouraged her to continue these exercises at home. She agrees that it is important to continue this even w/o formal instruction. No significant changes at this time. Patient needed a refill on her Plavix.  - I will continue to watch patient's progress. - At last visit i recommended patient look into outside sources of "rehab" I forgot to bring this up to her at this visit but I believe this will likely be the best resource for her in the future if formal PT is not an option. I will make a note to have this discussion w/ her in the future.

## 2014-08-17 ENCOUNTER — Other Ambulatory Visit: Payer: Self-pay | Admitting: *Deleted

## 2014-08-17 MED ORDER — CLOPIDOGREL BISULFATE 75 MG PO TABS: 75.0000 mg | ORAL_TABLET | Freq: Every day | ORAL | Status: DC

## 2014-08-17 NOTE — Telephone Encounter (Signed)
Request is for 90 day supply. Derl Barrow, RN

## 2014-08-20 ENCOUNTER — Other Ambulatory Visit: Payer: Self-pay

## 2014-08-20 MED ORDER — TOPIRAMATE 100 MG PO TABS: 100.0000 mg | ORAL_TABLET | Freq: Two times a day (BID) | ORAL | Status: DC

## 2014-08-20 MED ORDER — SERTRALINE HCL 25 MG PO TABS: 25.0000 mg | ORAL_TABLET | Freq: Every day | ORAL | Status: DC

## 2014-09-26 ENCOUNTER — Encounter: Payer: Self-pay | Admitting: Neurology

## 2014-09-26 ENCOUNTER — Ambulatory Visit (INDEPENDENT_AMBULATORY_CARE_PROVIDER_SITE_OTHER): Payer: No Typology Code available for payment source | Admitting: Neurology

## 2014-09-26 VITALS — BP 95/66 | HR 68 | Ht 67.0 in | Wt 165.0 lb

## 2014-09-26 DIAGNOSIS — G43711 Chronic migraine without aura, intractable, with status migrainosus: Secondary | ICD-10-CM | POA: Diagnosis not present

## 2014-09-26 DIAGNOSIS — G43901 Migraine, unspecified, not intractable, with status migrainosus: Secondary | ICD-10-CM

## 2014-09-26 DIAGNOSIS — G43701 Chronic migraine without aura, not intractable, with status migrainosus: Secondary | ICD-10-CM | POA: Insufficient documentation

## 2014-09-26 NOTE — Progress Notes (Signed)
Reason for visit: Migraine headache  [name] is an 64 y.o. female  History of present illness:  [name] is a 64 year old right-handed white female with a history of chronic intractable migraine headache. She indicates that one year ago, she developed right-sided numbness involving the face, arm, and leg that has been constant since that time. MRI evaluation of the brain did not show any lesions that would explain the sensory alteration. The patient continues to have chronic daily headaches, the headaches are primarily right-sided, but when they become severe the left side will be involved as well. The patient will have dizziness off and on with the headache, and some nausea and vomiting. She has photophobia and phonophobia. Certain odors such as fish may bring on headaches, and weather changes may affect her. Bright lights will also bring on headaches. She has severe headaches on average twice a month, she indicates that the use of her medications has brought down the severity of the headache some so she can function most days. She returns to this office for further evaluation.  Past Medical History  Diagnosis Date  . Hyperlipidemia   . Migraine headache   . GERD (gastroesophageal reflux disease)   . Occipital neuralgia     Left  . Chronic kidney disease     stage 3  . Stroke 10/2013    Past Surgical History  Procedure Laterality Date  . Abdominal hysterectomy    . Knee surgery    . Carpal tunnel release    . Elbow surgery    . Lung surgery    . Bladder repair    . Cervical spine surgery      Family History  Problem Relation Age of Onset  . Cancer Mother 74    breast  . Alzheimer's disease Mother   . Cancer Brother   . Hyperlipidemia Brother   . Prostate cancer Father   . Lung cancer Father     Social history:  reports that she quit smoking about 2 years ago. Her smoking use included Cigarettes. She smoked 0.30 packs per day. She has never used smokeless  tobacco. She reports that she does not drink alcohol or use illicit drugs.    Allergies  Allergen Reactions  . Shellfish Allergy Anaphylaxis and Other (See Comments)    All seafood    Medications:  Prior to Admission medications   Medication Sig Start Date End Date Taking? Authorizing Provider  clopidogrel (PLAVIX) 75 MG tablet Take 1 tablet (75 mg total) by mouth daily. 08/17/14  Yes Elberta Leatherwood, MD  doxepin (SINEQUAN) 100 MG capsule Take 100 mg by mouth at bedtime.   Yes Historical Provider, MD  furosemide (LASIX) 20 MG tablet Take 20 mg by mouth daily. 05/09/14  Yes Historical Provider, MD  gabapentin (NEURONTIN) 300 MG capsule Take 1 capsule (300 mg total) by mouth 2 (two) times daily. 05/15/14  Yes Charlett Blake, MD  lidocaine (LIDODERM) 5 % Place 2 patches onto the skin daily. Remove & Discard patch within 12 hours or as directed by MD 05/15/14  Yes Charlett Blake, MD  ramipril (ALTACE) 5 MG tablet Take 5 mg by mouth daily.   Yes Historical Provider, MD  sertraline (ZOLOFT) 25 MG tablet Take 1 tablet (25 mg total) by mouth daily. 08/20/14  Yes Ward Givens, NP  tiZANidine (ZANAFLEX) 2 MG tablet TAKE 1 TABLET BY MOUTH TWICE A DAY FOR ONE WEEK, THEN INCREASE TO THREE TIMES A DAY THEREAFTER.  05/28/14  Yes Ward Givens, NP  topiramate (TOPAMAX) 100 MG tablet Take 1 tablet (100 mg total) by mouth 2 (two) times daily. 08/20/14  Yes Ward Givens, NP    ROS:  Out of a complete 14 system review of symptoms, the patient complains only of the following symptoms, and all other reviewed systems are negative.  Decreased activity, fatigue, excessive sweating Ringing in the ears, difficulty swallowing Light sensitivity, eye pain, blurred vision Shortness of breath Leg swelling Excessive thirst Nausea Restless legs, frequent waking Muscle cramps Bruising easily Memory loss, dizziness, headache, numbness, speech difficulty Agitation, depression, anxiety  Blood pressure 95/66, pulse  68, height 5\' 7"  (1.702 m), weight 165 lb (74.844 kg).  Physical Exam  General: The patient is alert and cooperative at the time of the examination.  Skin: No significant peripheral edema is noted.   Neurologic Exam  Mental status: The patient is alert and oriented x 3 at the time of the examination. The patient has apparent normal recent and remote memory, with an apparently normal attention span and concentration ability.   Cranial nerves: Facial symmetry is present. Speech is normal, no aphasia or dysarthria is noted. Extraocular movements are full. Visual fields are full.  Motor: The patient has good strength in all 4 extremities.  Sensory examination: Soft touch sensation is decreased on the right face, arm, and leg. Vibration sensation is decreased on the right arm and right leg, and the patient splits the midline of the forehead with vibration sensation, decreased on the right.  Coordination: The patient has good finger-nose-finger and heel-to-shin bilaterally.  Gait and station: The patient has a normal gait. Tandem gait is normal. Romberg is negative. No drift is seen.  Reflexes: Deep tendon reflexes are symmetric.   MRI brain 10/11/13:  IMPRESSION: 1. No acute intracranial abnormality. 2. Mild for age nonspecific cerebral white matter signal changes. 3. 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  * MRI scan images were reviewed online. I agree with the written report.    Assessment/Plan:  1. Chronic intractable migraine  2. Right hemisensory deficit, nonorganic neurologic examination  The patient reports ongoing chronic daily headache mainly affecting the right side of the head. She has developed a right hemisensory deficit, but clinical examination suggests nonorganic features. The patient is on Topamax with some benefit, she is also on doxepin, ramipril, gabapentin, and Zoloft without full control the headache. I have  discussed the possibility of Botox treatments, she is amenable to this. I will try to get this scheduled in the near future. Otherwise, she will follow-up in 3-4 months.  Jill Alexanders MD 09/26/2014 7:55 PM  [facility] [address] [place], [place] [address]  Phone [phone] Fax [phone]

## 2014-09-26 NOTE — Patient Instructions (Signed)

## 2014-10-15 ENCOUNTER — Other Ambulatory Visit: Payer: Self-pay

## 2014-10-15 MED ORDER — TIZANIDINE HCL 2 MG PO TABS: 2.0000 mg | ORAL_TABLET | Freq: Three times a day (TID) | ORAL | Status: DC

## 2014-10-15 MED ORDER — DOXEPIN HCL 100 MG PO CAPS: 200.0000 mg | ORAL_CAPSULE | Freq: Every day | ORAL | Status: DC

## 2014-11-06 ENCOUNTER — Other Ambulatory Visit: Payer: Self-pay | Admitting: Adult Health

## 2014-11-28 ENCOUNTER — Telehealth: Payer: Self-pay | Admitting: Family Medicine

## 2014-11-28 NOTE — Telephone Encounter (Signed)
Has one month left of therapy at [facility], had to take a break so she could pay the bill down. Says she is now ready to resume and needs an order to be placed.

## 2014-12-04 NOTE — Telephone Encounter (Signed)
I will likely have to reassess her myself before this referral can be made.

## 2014-12-05 NOTE — Telephone Encounter (Signed)
Has appointment scheduled for 10/17.

## 2014-12-06 ENCOUNTER — Encounter: Payer: Self-pay | Admitting: Neurology

## 2014-12-14 ENCOUNTER — Telehealth: Payer: Self-pay | Admitting: *Deleted

## 2014-12-14 NOTE — Telephone Encounter (Signed)
Donte is calling and requesting a PA be initiated on lidocaine patches..  [phone] and then send in to [facility].

## 2014-12-14 NOTE — Telephone Encounter (Signed)
PA initiated 12/14/2014

## 2014-12-25 ENCOUNTER — Encounter: Payer: Self-pay | Admitting: Family Medicine

## 2014-12-25 ENCOUNTER — Ambulatory Visit (INDEPENDENT_AMBULATORY_CARE_PROVIDER_SITE_OTHER): Payer: No Typology Code available for payment source | Admitting: Family Medicine

## 2014-12-25 VITALS — BP 142/96 | HR 75 | Temp 98.1°F | Ht 67.0 in | Wt 161.0 lb

## 2014-12-25 DIAGNOSIS — IMO0001 Reserved for inherently not codable concepts without codable children: Secondary | ICD-10-CM

## 2014-12-25 DIAGNOSIS — R51 Headache: Secondary | ICD-10-CM

## 2014-12-25 DIAGNOSIS — R42 Dizziness and giddiness: Secondary | ICD-10-CM | POA: Diagnosis not present

## 2014-12-25 DIAGNOSIS — R030 Elevated blood-pressure reading, without diagnosis of hypertension: Secondary | ICD-10-CM

## 2014-12-25 DIAGNOSIS — I10 Essential (primary) hypertension: Secondary | ICD-10-CM | POA: Diagnosis not present

## 2014-12-25 DIAGNOSIS — G459 Transient cerebral ischemic attack, unspecified: Secondary | ICD-10-CM

## 2014-12-25 DIAGNOSIS — G43009 Migraine without aura, not intractable, without status migrainosus: Secondary | ICD-10-CM

## 2014-12-25 DIAGNOSIS — R519 Headache, unspecified: Secondary | ICD-10-CM

## 2014-12-25 LAB — COMPREHENSIVE METABOLIC PANEL
ALT: 17 U/L (ref 6–29)
AST: 20 U/L (ref 10–35)
Albumin: 4.6 g/dL (ref 3.6–5.1)
Alkaline Phosphatase: 97 U/L (ref 33–130)
BUN: 17 mg/dL (ref 7–25)
CO2: 24 mmol/L (ref 20–31)
Calcium: 9.2 mg/dL (ref 8.6–10.4)
Chloride: 106 mmol/L (ref 98–110)
Creat: 1.13 mg/dL — ABNORMAL HIGH (ref 0.50–0.99)
Glucose, Bld: 88 mg/dL (ref 65–99)
Potassium: 4.1 mmol/L (ref 3.5–5.3)
Sodium: 141 mmol/L (ref 135–146)
Total Bilirubin: 0.4 mg/dL (ref 0.2–1.2)
Total Protein: 7.3 g/dL (ref 6.1–8.1)

## 2014-12-25 LAB — CBC
HCT: 42.3 % (ref 36.0–46.0)
Hemoglobin: 14.1 g/dL (ref 12.0–15.0)
MCH: 28.9 pg (ref 26.0–34.0)
MCHC: 33.3 g/dL (ref 30.0–36.0)
MCV: 86.7 fL (ref 78.0–100.0)
MPV: 10.5 fL (ref 8.6–12.4)
Platelets: 285 10*3/uL (ref 150–400)
RBC: 4.88 MIL/uL (ref 3.87–5.11)
RDW: 14.1 % (ref 11.5–15.5)
WBC: 6.1 10*3/uL (ref 4.0–10.5)

## 2014-12-25 LAB — LIPID PANEL
Cholesterol: 279 mg/dL — ABNORMAL HIGH (ref 125–200)
HDL: 56 mg/dL (ref >=46)
LDL Cholesterol: 184 mg/dL — ABNORMAL HIGH (ref <130)
Total CHOL/HDL Ratio: 5 Ratio (ref <=5.0)
Triglycerides: 197 mg/dL — ABNORMAL HIGH (ref <150)
VLDL: 39 mg/dL — ABNORMAL HIGH (ref <30)

## 2014-12-25 MED ORDER — KETOROLAC TROMETHAMINE 30 MG/ML IJ SOLN
30.0000 mg | Freq: Once | INTRAMUSCULAR | Status: AC
  Administered 2014-12-25: 30 mg via INTRAMUSCULAR

## 2014-12-25 NOTE — Progress Notes (Signed)
   HPI  CC: Dizziness Patient is here for follow-up on her dizziness. She has been seen in the past by neuro rehabilitation for this issue. She had to discontinue her visits there secondary to issues with her insurance. She reports that her symptoms have not changed since that time, however symptoms are dramatically worse bending over. She endorses some frequent falling due to this dizziness. She also reports some headaches over the past 2-3 days. Headaches are located in a bandlike fashion around her scalp. She reports some improvement when relaxing in a dark room. Some mild photosensitivity also reported. No new weakness or numbness reported, but continues to endorse that her right side is her "bad side".  ROS: Positive for headaches, dizziness, vertigo, shortness of breath. Negative for chest pain, confusion, swelling, numbness, weakness, blurred vision.  Objective: BP 142/96 mmHg  Pulse 75  Temp(Src) 98.1 F (36.7 C) (Oral)  Ht 5\' 7"  (1.702 m)  Wt 161 lb (73.029 kg)  BMI 25.21 kg/m2 Gen: NAD, alert, cooperative, and pleasant. HEENT: NCAT, EOMI, PERRL CV: RRR, no murmur Resp: CTAB, no wheezes, non-labored Ext: No edema, warm Neuro: Alert and oriented, sensation diminished on entire right side compared to left. Strength 5/5 on left side, strength 4/5 on right side (throughout all muscle groups). Right-sided heel-to-shin diminished on right when compared to left. Gait improved from previous visit. Toe walk and heel walk both normal  Assessment and plan:  TIA (transient ischemic attack) Physical exam relatively unchanged from prior. - Referral to neuro rehabilitation provided.  Migraine Patient is been experiencing a headache over the past 2 days. No significant symptoms of concern at this time. - 30 mg Toradol provided IM in our office with some improvement of symptoms. - Patient has to follow-up if headache does not resolve in the next 24-48 hours.  HTN (hypertension) Likely  secondary to pain from headache. - Labs drawn today: CBC, TSH, CMP, lipids.    Orders Placed This Encounter  Procedures  . CBC  . TSH  . Comprehensive metabolic panel  . Lipid panel  . Referral to Neuro Rehab    Referral Priority:  Routine    Referral Type:  Consultation    Number of Visits Requested:  1    Meds ordered this encounter  Medications  . ketorolac (TORADOL) 30 MG/ML injection 30 mg    Sig:      Courtney Leatherwood, MD,MS,  PGY2 12/25/2014 6:15 PM

## 2014-12-25 NOTE — Assessment & Plan Note (Signed)
Patient is been experiencing a headache over the past 2 days. No significant symptoms of concern at this time. - 30 mg Toradol provided IM in our office with some improvement of symptoms. - Patient has to follow-up if headache does not resolve in the next 24-48 hours.

## 2014-12-25 NOTE — Assessment & Plan Note (Signed)
Likely secondary to pain from headache. - Labs drawn today: CBC, TSH, CMP, lipids.

## 2014-12-25 NOTE — Assessment & Plan Note (Signed)
Physical exam relatively unchanged from prior. - Referral to neuro rehabilitation provided.

## 2014-12-25 NOTE — Patient Instructions (Signed)
It was a pleasure seeing you today in our clinic. Today we discussed your headache and dizziness. Here is the treatment plan we have discussed and agreed upon together:  - I have placed a referral to neuro rehab. - I have drawn labs today. I will contact you if there are any medication changes necessary. Otherwise these will be mailed to you.

## 2014-12-26 LAB — TSH: TSH: 1.402 u[IU]/mL (ref 0.350–4.500)

## 2015-01-01 ENCOUNTER — Telehealth: Payer: Self-pay | Admitting: Physical Medicine & Rehabilitation

## 2015-01-01 NOTE — Telephone Encounter (Signed)
Patients insurance is denying her patches and would like to know what she can do.  Please call her at [phone].

## 2015-01-02 ENCOUNTER — Encounter: Payer: Self-pay | Admitting: Family Medicine

## 2015-01-02 ENCOUNTER — Telehealth: Payer: Self-pay | Admitting: Family Medicine

## 2015-01-02 NOTE — Telephone Encounter (Signed)
Patient returned call and states anytime today or tomorrow would suffice. [name], ASA

## 2015-01-02 NOTE — Telephone Encounter (Signed)
Called patient to attempt to discuss recent labs. Would like to initiate Statin. Message left asking for ideal time to reach her.

## 2015-01-03 NOTE — Telephone Encounter (Signed)
Spoke with [name]. [name]. Advised that she can try to use the OTC medication of Lidocare patches. She states that she will let us know if they help since her insurance will not cover her for Lidocaine patches.

## 2015-01-03 NOTE — Telephone Encounter (Signed)
LMTCB.. MAH 

## 2015-01-04 ENCOUNTER — Other Ambulatory Visit: Payer: Self-pay | Admitting: Family Medicine

## 2015-01-04 MED ORDER — ATORVASTATIN CALCIUM 40 MG PO TABS: 40.0000 mg | ORAL_TABLET | Freq: Every day | ORAL | Status: DC

## 2015-01-04 NOTE — Telephone Encounter (Signed)
Patient called. Discussed lab results. Patient ok w/ starting atorvastatin. Discussed SAs to watch for. Asked to make appt in ~4wks to assess tolerance.

## 2015-01-08 ENCOUNTER — Encounter: Payer: Self-pay | Admitting: Physical Therapy

## 2015-01-08 ENCOUNTER — Ambulatory Visit: Payer: No Typology Code available for payment source | Attending: Family Medicine | Admitting: Physical Therapy

## 2015-01-08 DIAGNOSIS — R269 Unspecified abnormalities of gait and mobility: Secondary | ICD-10-CM | POA: Diagnosis present

## 2015-01-08 DIAGNOSIS — R42 Dizziness and giddiness: Secondary | ICD-10-CM | POA: Diagnosis not present

## 2015-01-09 ENCOUNTER — Encounter: Payer: Self-pay | Admitting: Physical Therapy

## 2015-01-09 NOTE — Therapy (Signed)
[facility] [address] [place], [place], [address] Phone: [phone]   Fax:  [phone]  Physical Therapy Evaluation  Patient Details  Name: [name] MRN: [id number] Date of Birth: 1951/02/02 Referring Provider: Dr. Georges Lynch  Encounter Date: 01/08/2015      [name] End of Session - 01/09/15 1328    Visit Number 1   Date for [name] Re-Evaluation 02/07/15   Authorization Type coventry   [name] Start Time 5397   [name] Stop Time 1017   [name] Time Calculation (min) 44 min      Past Medical History  Diagnosis Date  . Hyperlipidemia   . Migraine headache   . GERD (gastroesophageal reflux disease)   . Occipital neuralgia     Left  . Chronic kidney disease     stage 3  . Stroke [facility]) 10/2013    Past Surgical History  Procedure Laterality Date  . Abdominal hysterectomy    . Knee surgery    . Carpal tunnel release    . Elbow surgery    . Lung surgery    . Bladder repair    . Cervical spine surgery      There were no vitals filed for this visit.  Visit Diagnosis:  Dizziness and giddiness  Abnormality of gait      Subjective Assessment - 01/09/15 1306    Subjective [name] states vertigo is intermittent - occurs with bending over - especially with turning toward right side   Pertinent History TIA's - 10-10-13; migraine headaches x 30 yrs   Diagnostic tests MRI, CT WNLs per patient report.   Patient Stated Goals Resolve the vertigo; improve balance   Currently in Pain? Yes   Pain Score 5    Pain Location Head   Pain Orientation Right   Pain Descriptors / Indicators Aching   Pain Type Chronic pain   Pain Onset More than a month ago   Pain Frequency Constant   Multiple Pain Sites No            [facility] [name] Assessment - 01/09/15 0001    Assessment   Medical Diagnosis Vertigo; h/o TIA's and migraine headaches   Referring Provider Dr. Georges Lynch   Onset Date/Surgical Date 10/10/13   Prior Therapy Feb. - May  2016   Precautions   Precautions Fall   Precaution Comments LOB with quick turns and bending over   Balance Screen   Has the patient fallen in the past 6 months Yes   How many times? 7+   Has the patient had a decrease in activity level because of a fear of falling?  No   Is the patient reluctant to leave their home because of a fear of falling?  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   Observation/Other Assessments   Focus on Therapeutic Outcomes (FOTO)  64  58% risk adjusted   Other Surveys  --  DHI 60%   Ambulation/Gait   Ambulation/Gait Yes   Ambulation/Gait  Assistance 6: Modified independent (Device/Increase time)   Ambulation Distance (Feet) 120 Feet   Assistive device None   Gait Pattern Within Functional Limits   Ambulation Surface Level;Indoor   Gait velocity 3.46 ft/sec  9.48   Standardized Balance Assessment   Standardized Balance Assessment Dynamic Gait Index   Dynamic Gait Index   Level Surface Normal   Change in Gait Speed Mild Impairment   Gait with Horizontal Head Turns Moderate Impairment   Gait with Vertical Head Turns Mild Impairment   Gait and Pivot Turn Moderate Impairment   Step Over Obstacle Mild Impairment   Step Around Obstacles Mild Impairment   Steps Normal   Total Score 16   Timed Up and Go Test   Normal TUG (seconds) 9.32      Romberg EO = 30 secs;  EC= 30 secs  Sharpened Romberg EO= 30 secs;  EC 20.37 secs -- 9.3 secs prior to  Stagger step   SLS ; LLE 12.75 secs;  RLE 14.12  secs                       [name] Short Term Goals - 06/01/14 1522    [name] SHORT TERM GOAL #4   Title Report at least 25% improvement in vertigo with sit to stand and with supine to sit.   Time 4   Period Weeks   Status On-going           [name] Long Term Goals - 01/09/15 1341    [name] LONG TERM GOAL #1   Title Increase SOT score by at least 20 points for composite score to demo improved balance.  (target 02-07-15)   Time 4   Period Weeks   Status New   [name] LONG TERM GOAL #2   Title Improve DHI score to </= 45/100 to demo improvement in vertigo (02-07-15)   Baseline 60% on 01-08-15   Time 4   Period Weeks   Status New   [name] LONG TERM GOAL #3   Title Report at least 50% improvement in vertigo  (02-07-15)   Time 4   Period Weeks   Status New   [name] LONG TERM GOAL #4   Title Independent in updated HEP as appropriate (02-07-15)   Time 4   Period Weeks   Status New               Plan - 01/09/15 1333    [name] will benefit from skilled therapeutic intervention in order to improve on the following deficits Abnormal gait;Decreased balance;Other (comment);Dizziness   Rehab Potential Good   Clinical Impairments Affecting Rehab Potential migraines   [name] Frequency 2x / week   [name] Duration 4 weeks   [name] Treatment/Interventions Therapeutic activities;Patient/family education;Therapeutic exercise;Gait training;Balance training;Neuromuscular re-education;Stair training;Vestibular;ADLs/Self Care Home Management;Functional mobility training   [name] Next Visit Plan review previous HEP - do SOT   [name] Home Exercise Plan gaze stabilization; balance on foam - previously instructed   Consulted and Agree with Plan of Care Patient         Problem List Patient Active Problem List   Diagnosis Date Noted  . Chronic migraine without aura, with status migrainosus 09/26/2014  . URI (upper respiratory infection) 02/10/2014  . Migraine 01/03/2014  . Dizziness 11/09/2013  . Adjustment  disorder with mixed anxiety and depressed mood 11/03/2013  . Migraine with status migrainosus 10/26/2013  . Migraine variant 10/10/2013  . Disturbance of skin sensation 10/10/2013  . UTI (lower urinary  tract infection) 10/10/2013  . TIA (transient ischemic attack) 10/10/2013  . HTN (hypertension) 07/18/2013  . 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 Neuralgia, neuritis, and radiculitis, unspecified 02/04/2012  . Intractable migraine without aura 02/04/2012  . 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 Myofascial muscle pain 07/15/2011    [name], [name], [name] 01/09/2015, 1:48 PM  [facility] [address] [place], [place], [address] Phone: [phone]   Fax:  [phone]  Name: [name] MRN: [id number] Date of Birth: 12-31-1950

## 2015-01-15 ENCOUNTER — Ambulatory Visit: Payer: No Typology Code available for payment source | Attending: Family Medicine | Admitting: Physical Therapy

## 2015-01-15 ENCOUNTER — Encounter: Payer: Self-pay | Admitting: Physical Therapy

## 2015-01-15 DIAGNOSIS — R269 Unspecified abnormalities of gait and mobility: Secondary | ICD-10-CM

## 2015-01-15 DIAGNOSIS — R42 Dizziness and giddiness: Secondary | ICD-10-CM | POA: Diagnosis present

## 2015-01-15 NOTE — Therapy (Signed)
[facility] [address] [place], [place], [address] Phone: [phone]   Fax:  [phone]  Physical Therapy Treatment  Patient Details  Name: [name] MRN: [id number] Date of Birth: February 08, 1951 Referring Provider: Dr. Georges Lynch  Encounter Date: 01/15/2015      PT End of Session - 01/15/15 1248    Visit Number 2   Date for PT Re-Evaluation 02/07/15   Authorization Type coventry   PT Start Time 0850   PT Stop Time 0934   PT Time Calculation (min) 44 min      Past Medical History  Diagnosis Date  . Hyperlipidemia   . Migraine headache   . GERD (gastroesophageal reflux disease)   . Occipital neuralgia     Left  . Chronic kidney disease     stage 3  . Stroke [facility]) 10/2013    Past Surgical History  Procedure Laterality Date  . Abdominal hysterectomy    . Knee surgery    . Carpal tunnel release    . Elbow surgery    . Lung surgery    . Bladder repair    . Cervical spine surgery      There were no vitals filed for this visit.  Visit Diagnosis:  Dizziness and giddiness  Abnormality of gait      Subjective Assessment - 01/15/15 1240    Subjective Pt. states vertigo is not too bad at time of start of PT session - is increased with quick turns and with turning toward R side   Pertinent History TIA's - 10-10-13; migraine headaches x 30 yrs   Diagnostic tests MRI, CT WNLs per patient report.   Patient Stated Goals Resolve the vertigo; improve balance   Currently in Pain? No/denies            NeuroRe-ed:  Habituation for central vertigo -- pt performed sit to stand with 180 pivot turn 5 reps to R and L sides; Marching on blue mat on incline with head turns horizontally x 10 reps; standing on blue mat - performing figure 8's with Ball between legs with up/down movement between each rep x 5 reps; climbing steps x 3 reps with use of rail with  quick turn at top of steps; amb. 50' x 2 reps with  quick stop/turn 180 degrees with CGA Amb. 120' with visual tracking clockwise of ball ([employer] systems engineer for incr. Visual input) and 120' counterclockwise tracking ball  Standing on BOSU - moving each leg up/back and laterally x 5 reps each with UE support prn  Standing on blue mat - reaching down to floor for a cone, 180 degree turn to place in cabinet - 6 cones with turns to R and then  6 cones with turns to L side to place on top shelf in cabinet with CGA for safety              Balance Exercises - 01/15/15 1241    Balance Exercises: Standing   Standing Eyes Opened Narrow base of support (BOS);Wide (BOA);Head turns;Foam/compliant surface;Solid surface;2 reps;10 secs   Standing Eyes Closed Narrow base of support (BOS);Wide (BOA);Head turns;Foam/compliant surface;1 rep;20 secs           PT Education - 01/15/15 1242    Education provided Yes   Education Details Recommend pt continue with balance on foam exercises as previously instructed   Person(s) Educated Patient   Methods Explanation   Comprehension Verbalized understanding  PT Long Term Goals - 01/09/15 1341    PT LONG TERM GOAL #1   Title Increase SOT score by at least 20 points for composite score to demo improved balance.  (target 02-07-15)   Time 4   Period Weeks   Status New   PT LONG TERM GOAL #2   Title Improve DHI score to </= 45/100 to demo improvement in vertigo (02-07-15)   Baseline 60% on 01-08-15   Time 4   Period Weeks   Status New   PT LONG TERM GOAL #3   Title Report at least 50% improvement in vertigo  (02-07-15)   Time 4   Period Weeks   Status New   PT LONG TERM GOAL #4   Title Independent in updated HEP as appropriate (02-07-15)   Time 4   Period Weeks   Status New               Plan - 01/15/15 1256    PT Next Visit Plan Do SOT: continue with vestibular/balance activities - turns toward R side, quick turns, etc. - habituation acitivities         Problem List Patient Active Problem List   Diagnosis Date Noted  . Chronic migraine without aura, with status migrainosus 09/26/2014  . URI (upper respiratory infection) 02/10/2014  . Migraine 01/03/2014  . Dizziness 11/09/2013  . Adjustment disorder with mixed anxiety and depressed mood 11/03/2013  . Migraine with status migrainosus 10/26/2013  . Migraine variant 10/10/2013  . Disturbance of skin sensation 10/10/2013  . UTI (lower urinary tract infection) 10/10/2013  . TIA (transient ischemic attack) 10/10/2013  . HTN (hypertension) 07/18/2013  . 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 Neuralgia, neuritis, and radiculitis, unspecified 02/04/2012  . Intractable migraine without aura 02/04/2012  . 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 Myofascial muscle pain 07/15/2011    Alda Lea, PT 01/15/2015, 12:58 PM  [facility] [address] [place], [place], [address] Phone: [phone]   Fax:  [phone]  Name: [name] MRN: [id number] Date of Birth: 04/22/1950

## 2015-01-19 ENCOUNTER — Encounter: Payer: Self-pay | Admitting: Neurology

## 2015-01-19 ENCOUNTER — Ambulatory Visit: Payer: No Typology Code available for payment source | Admitting: Rehabilitative and Restorative Service Providers"

## 2015-01-22 ENCOUNTER — Ambulatory Visit: Payer: No Typology Code available for payment source | Admitting: Physical Therapy

## 2015-01-22 ENCOUNTER — Encounter: Payer: Self-pay | Admitting: Physical Therapy

## 2015-01-22 ENCOUNTER — Encounter: Payer: Self-pay | Admitting: *Deleted

## 2015-01-22 DIAGNOSIS — R42 Dizziness and giddiness: Secondary | ICD-10-CM

## 2015-01-22 DIAGNOSIS — R269 Unspecified abnormalities of gait and mobility: Secondary | ICD-10-CM

## 2015-01-22 NOTE — Therapy (Signed)
[facility] [address] [place], [place], [address] Phone: [phone]   Fax:  [phone]  Physical Therapy Treatment  Patient Details  Name: [name] MRN: [id number] Date of Birth: [birth year] Referring Provider: Dr. Georges Lynch  Encounter Date: 01/22/2015      PT End of Session - 01/22/15 1159    Visit Number 3   Date for PT Re-Evaluation 02/07/15   Authorization Type coventry   PT Start Time 0800   PT Stop Time 0845   PT Time Calculation (min) 45 min   Equipment Utilized During Treatment Gait belt      Past Medical History  Diagnosis Date  . Hyperlipidemia   . Migraine headache   . GERD (gastroesophageal reflux disease)   . Occipital neuralgia     Left  . Chronic kidney disease     stage 3  . Stroke [facility]) 10/2013    Past Surgical History  Procedure Laterality Date  . Abdominal hysterectomy    . Knee surgery    . Carpal tunnel release    . Elbow surgery    . Lung surgery    . Bladder repair    . Cervical spine surgery      There were no vitals filed for this visit.  Visit Diagnosis:  Dizziness and giddiness  Abnormality of gait      Subjective Assessment - 01/22/15 1157    Subjective Pt reports she was sick on Friday with a virus - is feeling better; states vertigo "is not too bad" prior to rx session today   Pertinent History TIA's - 10-10-13; migraine headaches x 30 yrs   Diagnostic tests MRI, CT WNLs per patient report.   Patient Stated Goals Resolve the vertigo; improve balance   Currently in Pain? No/denies                              Balance Exercises - 01/22/15 1158    Balance Exercises: Standing   Standing Eyes Opened Narrow base of support (BOS);Wide (BOA);Head turns;Foam/compliant surface;5 reps   Standing Eyes Closed Narrow base of support (BOS);Wide (BOA);Head turns;Foam/compliant surface  on incline - with blue mat on ramp   Rockerboard  Anterior/posterior;EO;EC;20 seconds   Gait with Head Turns Forward;Retro  making circles with ball for improved VOR   Turning Right;10 reps  on blue mat - with 180 degree turns     sit to stand from mat - reaching down to floor toward R and L sides with trunk rotation to opposite side - 10 reps each; Sitting - same exercise but with EC - 5 reps to each side  Standing on blue mat - fig. 8 with ball between legs - return to upright - 180 degree turn x 5 reps   Standing on incline and decline - on blue mat - marching with head turns - with EO and EC - with min assist Standing sideways on ramp - both ways -  Crossover step with trunk rotation x 5 reps each side  Ambulating - gaze stabilization - making circles clockwise and counterclockwise with ball with visual tracking for improved VOR function Ambulating on track - with abrupt stop/turn and go -  with CGA for safety with unsteadiness produced with quick turn   Elliptical - level 1.5 x 1" forward; 15 secs backward and then report of significant incr. Vertigo so pt requested to stop - this exercise given  For increased vestibular input with activity and movement           PT Long Term Goals - 01/09/15 1341    PT LONG TERM GOAL #1   Title Increase SOT score by at least 20 points for composite score to demo improved balance.  (target 02-07-15)   Time 4   Period Weeks   Status New   PT LONG TERM GOAL #2   Title Improve DHI score to </= 45/100 to demo improvement in vertigo (02-07-15)   Baseline 60% on 01-08-15   Time 4   Period Weeks   Status New   PT LONG TERM GOAL #3   Title Report at least 50% improvement in vertigo  (02-07-15)   Time 4   Period Weeks   Status New   PT LONG TERM GOAL #4   Title Independent in updated HEP as appropriate (02-07-15)   Time 4   Period Weeks   Status New               Plan - 01/22/15 1200    Clinical Impression Statement Pt continues to report highest intensity of vertigo occurs  with turning toward R side - able to perform quick turns on floor and on mat with slight unsteadiness with vertigo 5/10 intensity at its greatest intensity; pt able to independently recover LOB   Pt will benefit from skilled therapeutic intervention in order to improve on the following deficits Abnormal gait;Decreased balance;Other (comment);Dizziness   Rehab Potential Good   PT Frequency 2x / week   PT Duration 4 weeks   PT Treatment/Interventions Therapeutic activities;Patient/family education;Therapeutic exercise;Gait training;Balance training;Neuromuscular re-education;Stair training;Vestibular;ADLs/Self Care Home Management;Functional mobility training   PT Next Visit Plan continue habituation exercises   PT Home Exercise Plan gaze stabilization; balance on foam - previously instructed   Consulted and Agree with Plan of Care Patient        Problem List Patient Active Problem List   Diagnosis Date Noted  . Chronic migraine without aura, with status migrainosus 09/26/2014  . URI (upper respiratory infection) 02/10/2014  . Migraine 01/03/2014  . Dizziness 11/09/2013  . Adjustment disorder with mixed anxiety and depressed mood 11/03/2013  . Migraine with status migrainosus 10/26/2013  . Migraine variant 10/10/2013  . Disturbance of skin sensation 10/10/2013  . UTI (lower urinary tract infection) 10/10/2013  . TIA (transient ischemic attack) 10/10/2013  . HTN (hypertension) 07/18/2013  . 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 Neuralgia, neuritis, and radiculitis, unspecified 02/04/2012  . Intractable migraine without aura 02/04/2012  . 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 Myofascial muscle pain 07/15/2011    Courtney Lea, PT 01/22/2015, 12:03 PM  [facility] [address] [place], [place], [address] Phone: [phone]   Fax:  [phone]  Name: [name] MRN: [id number] Date of Birth:  1950/12/25

## 2015-01-22 NOTE — Progress Notes (Signed)
Mailed letter dated 01/19/15 by Dr Jannifer Franklin to [facility] per Dr Jannifer Franklin request.  [address] [place] [address]

## 2015-01-26 ENCOUNTER — Ambulatory Visit (INDEPENDENT_AMBULATORY_CARE_PROVIDER_SITE_OTHER): Payer: No Typology Code available for payment source | Admitting: Neurology

## 2015-01-26 ENCOUNTER — Encounter: Payer: Self-pay | Admitting: Neurology

## 2015-01-26 VITALS — BP 141/86 | HR 62 | Ht 67.0 in | Wt 164.5 lb

## 2015-01-26 DIAGNOSIS — G43901 Migraine, unspecified, not intractable, with status migrainosus: Secondary | ICD-10-CM | POA: Diagnosis not present

## 2015-01-26 DIAGNOSIS — R209 Unspecified disturbances of skin sensation: Secondary | ICD-10-CM

## 2015-01-26 MED ORDER — GABAPENTIN 600 MG PO TABS: 600.0000 mg | ORAL_TABLET | Freq: Two times a day (BID) | ORAL | Status: DC

## 2015-01-26 NOTE — Patient Instructions (Addendum)
With the gabapentin, begin 300 mg one in the morning and 2 in the evening for 2 weeks, then take 600 mg twice a day. Start magnesium suplimentation 250 mg daily, and riboflavin 400 mg a day.   Migraine Headache A migraine headache is an intense, throbbing pain on one or both sides of your head. A migraine can last for 30 minutes to several hours. CAUSES  The exact cause of a migraine headache is not always known. However, a migraine may be caused when nerves in the brain become irritated and release chemicals that cause inflammation. This causes pain. Certain things may also trigger migraines, such as:  Alcohol.  Smoking.  Stress.  Menstruation.  Aged cheeses.  Foods or drinks that contain nitrates, glutamate, aspartame, or tyramine.  Lack of sleep.  Chocolate.  Caffeine.  Hunger.  Physical exertion.  Fatigue.  Medicines used to treat chest pain (nitroglycerine), birth control pills, estrogen, and some blood pressure medicines. SIGNS AND SYMPTOMS  Pain on one or both sides of your head.  Pulsating or throbbing pain.  Severe pain that prevents daily activities.  Pain that is aggravated by any physical activity.  Nausea, vomiting, or both.  Dizziness.  Pain with exposure to bright lights, loud noises, or activity.  General sensitivity to bright lights, loud noises, or smells. Before you get a migraine, you may get warning signs that a migraine is coming (aura). An aura may include:  Seeing flashing lights.  Seeing bright spots, halos, or zigzag lines.  Having tunnel vision or blurred vision.  Having feelings of numbness or tingling.  Having trouble talking.  Having muscle weakness. DIAGNOSIS  A migraine headache is often diagnosed based on:  Symptoms.  Physical exam.  A CT scan or MRI of your head. These imaging tests cannot diagnose migraines, but they can help rule out other causes of headaches. TREATMENT Medicines may be given for pain and  nausea. Medicines can also be given to help prevent recurrent migraines.  HOME CARE INSTRUCTIONS  Only take over-the-counter or prescription medicines for pain or discomfort as directed by your health care provider. The use of long-term narcotics is not recommended.  Lie down in a dark, quiet room when you have a migraine.  Keep a journal to find out what may trigger your migraine headaches. For example, write down:  What you eat and drink.  How much sleep you get.  Any change to your diet or medicines.  Limit alcohol consumption.  Quit smoking if you smoke.  Get 7-9 hours of sleep, or as recommended by your health care provider.  Limit stress.  Keep lights dim if bright lights bother you and make your migraines worse. SEEK IMMEDIATE MEDICAL CARE IF:   Your migraine becomes severe.  You have a fever.  You have a stiff neck.  You have vision loss.  You have muscular weakness or loss of muscle control.  You start losing your balance or have trouble walking.  You feel faint or pass out.  You have severe symptoms that are different from your first symptoms. MAKE SURE YOU:   Understand these instructions.  Will watch your condition.  Will get help right away if you are not doing well or get worse.   This information is not intended to replace advice given to you by your health care provider. Make sure you discuss any questions you have with your health care provider.   Document Released: 02/24/2005 Document Revised: 03/17/2014 Document Reviewed: 11/01/2012 Elsevier Interactive  Patient Education 2016 [employer].

## 2015-01-26 NOTE — Progress Notes (Signed)
Reason for visit: Migraine headache  [name] is an 64 y.o. female  History of present illness:  Ms. Oros is a 64 year old right-handed white female with a history of intractable migraine headache. The patient is having daily headaches, the headaches may become quite severe at least once a week, she may have 4 to 5 incapacitating headaches a month. She reports some neck stiffness with headache. She has photophobia and phonophobia. Factors such as weather changes, and certain odors such as fish or perfumes may bring on headaches rapidly. In the past, IV Depacon may help alleviate a severe headache. The patient reports some neck stiffness with the headache. She will have nausea, no vomiting. The patient may have some blurring of vision, no loss of vision. 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 She was to be set up for Botox injections, but apparently this was denied through her insurance. She indicates that her insurance will be changing at the beginning of the next year, we may consider reapplying for Botox at that time. The patient currently is on doxepin, gabapentin, tizanidine, and Topamax without much benefit. The patient is also on Zoloft and Altace. She returns to this office for an evaluation.  Past Medical History  Diagnosis Date  . Hyperlipidemia   . Migraine headache   . GERD (gastroesophageal reflux disease)   . Occipital neuralgia     Left  . Chronic kidney disease     stage 3  . Stroke [facility]) 10/2013    Past Surgical History  Procedure Laterality Date  . Abdominal hysterectomy    . Knee surgery    . Carpal tunnel release    . Elbow surgery    . Lung surgery    . Bladder repair    . Cervical spine surgery      Family History  Problem Relation Age of Onset  . Cancer Mother 34    breast  . Alzheimer's disease Mother   . Cancer Brother   . Hyperlipidemia Brother   . Prostate cancer Father   . Lung cancer Father      Social history:  reports that she quit smoking about 3 years ago. Her smoking use included Cigarettes. She smoked 0.30 packs per day. She has never used smokeless tobacco. She reports that she does not drink alcohol or use illicit drugs.    Allergies  Allergen Reactions  . Shellfish Allergy Anaphylaxis and Other (See Comments)    All seafood    Medications:  Prior to Admission medications   Medication Sig Start Date End Date Taking? Authorizing Provider  atorvastatin (LIPITOR) 40 MG tablet Take 1 tablet (40 mg total) by mouth daily. 01/04/15  Yes Elberta Leatherwood, MD  clopidogrel (PLAVIX) 75 MG tablet Take 1 tablet (75 mg total) by mouth daily. 08/17/14  Yes Elberta Leatherwood, MD  doxepin (SINEQUAN) 100 MG capsule Take 2 capsules (200 mg total) by mouth at bedtime. 10/15/14  Yes Kathrynn Ducking, MD  furosemide (LASIX) 20 MG tablet Take 20 mg by mouth daily. 05/09/14  Yes Historical Provider, MD  gabapentin (NEURONTIN) 300 MG capsule Take 1 capsule (300 mg total) by mouth 2 (two) times daily. 05/15/14  Yes Charlett Blake, MD  lidocaine (LIDODERM) 5 % Place 2 patches onto the skin daily. Remove & Discard patch within 12 hours or as directed by MD 05/15/14  Yes Charlett Blake, MD  ramipril (ALTACE) 5 MG tablet Take 5  mg by mouth daily.   Yes Historical Provider, MD  sertraline (ZOLOFT) 25 MG tablet Take 1 tablet (25 mg total) by mouth daily. 08/20/14  Yes Ward Givens, NP  tiZANidine (ZANAFLEX) 2 MG tablet Take 1 tablet (2 mg total) by mouth 3 (three) times daily. 10/15/14  Yes Kathrynn Ducking, MD  topiramate (TOPAMAX) 100 MG tablet TAKE 1 TABLET TWICE A DAY 11/06/14  Yes Kathrynn Ducking, MD    ROS:  Out of a complete 14 system review of symptoms, the patient complains only of the following symptoms, and all other reviewed systems are negative.  Decreased activity, fatigue Blurred vision Excessive thirst Memory loss, headache Agitation, decreased concentration, depression, anxiety Restless  legs  Blood pressure 141/86, pulse 62, height 5\' 7"  (1.702 m), weight 164 lb 8 oz (74.617 kg).  Physical Exam  General: The patient is alert and cooperative at the time of the examination.  Skin: No significant peripheral edema is noted.   Neurologic Exam  Mental status: The patient is alert and oriented x 3 at the time of the examination. The patient has apparent normal recent and remote memory, with an apparently normal attention span and concentration ability.   Cranial nerves: Facial symmetry is present. Speech is normal, no aphasia or dysarthria is noted. Extraocular movements are full. Visual fields are full.  Motor: The patient has good strength in all 4 extremities.  Sensory examination: Soft touch sensation is decreased on the right face, arm, and leg relative to the left.  Coordination: The patient has good finger-nose-finger and heel-to-shin bilaterally.  Gait and station: The patient has a normal gait. Tandem gait is normal. Romberg is negative. No drift is seen.  Reflexes: Deep tendon reflexes are symmetric.   Assessment/Plan:  1. Intractable migraine headache  2. Right hemisensory deficit, nonorganic  The patient continues to have ongoing significant headaches. The patient has failed multiple medications. The patient will be placed on riboflavin 400 mg daily, and go on magnesium supplementation. The patient will be increased on the gabapentin taking 600 mg twice daily. She will follow-up in 4 months.  Jill Alexanders MD 01/26/2015 8:16 PM  [facility] [address] [place], [place] [address]  Phone [phone] Fax [phone]

## 2015-01-29 ENCOUNTER — Ambulatory Visit: Payer: No Typology Code available for payment source | Admitting: Physical Therapy

## 2015-01-29 DIAGNOSIS — R42 Dizziness and giddiness: Secondary | ICD-10-CM | POA: Diagnosis not present

## 2015-02-04 ENCOUNTER — Encounter: Payer: Self-pay | Admitting: Physical Therapy

## 2015-02-04 NOTE — Therapy (Signed)
[facility] [address] [place], [place], [address] Phone: [phone]   Fax:  [phone]  Physical Therapy Treatment  Patient Details  Name: [name] MRN: [id number] Date of Birth: 1951-02-20 Referring Provider: Dr. Georges Lynch  Encounter Date: 01/29/2015      PT End of Session - 02/04/15 1933    Visit Number 4   Date for PT Re-Evaluation 02/07/15   Authorization Type coventry   PT Start Time 0801   PT Stop Time 0845   PT Time Calculation (min) 44 min   Equipment Utilized During Treatment Gait belt      Past Medical History  Diagnosis Date  . Hyperlipidemia   . Migraine headache   . GERD (gastroesophageal reflux disease)   . Occipital neuralgia     Left  . Chronic kidney disease     stage 3  . Stroke [facility]) 10/2013    Past Surgical History  Procedure Laterality Date  . Abdominal hysterectomy    . Knee surgery    . Carpal tunnel release    . Elbow surgery    . Lung surgery    . Bladder repair    . Cervical spine surgery      There were no vitals filed for this visit.  Visit Diagnosis:  Dizziness and giddiness      Subjective Assessment - 02/04/15 1926    Subjective Pt states she saw Dr. Jannifer Franklin on Friday for her headaches - insurance denied approval for Botox injections; reports mild HA at current time   Pertinent History TIA's - 10-10-13; migraine headaches x 30 yrs   Diagnostic tests MRI, CT WNLs per patient report.   Patient Stated Goals Resolve the vertigo; improve balance   Currently in Pain? Yes   Pain Score 4    Pain Location Head   Pain Orientation Right   Pain Descriptors / Indicators Aching   Pain Type Chronic pain   Pain Onset More than a month ago   Pain Frequency Constant                              Balance Exercises - 02/04/15 1931    Balance Exercises: Standing   Standing Eyes Opened Narrow base of support (BOS);Head turns;Foam/compliant  surface;2 reps;30 secs   Standing Eyes Closed Wide (BOA);Head turns;Foam/compliant surface;3 reps;10 secs   Rockerboard Anterior/posterior   Gait with Head Turns Forward;1 rep  120'   Turning Right;5 reps   Other Standing Exercises sit to stand with pivot turn to R and L sides x 5 reps each     NeuroRe-ed;  Vestibular stimulation exercises - marching on incline with head turns - with CGA to min A:  Amb. Making circles clockwise and counterclockwise  50' each direction with CGA for improved VOR function  Standing on blue mat - fig. 8 with ball between legs - up/down - with 180 degree turn for habituation - with min assist  Elliptical - 1"  Forward and 1" backward for vestibular stimulation - level 1.5         PT Long Term Goals - 01/09/15 1341    PT LONG TERM GOAL #1   Title Increase SOT score by at least 20 points for composite score to demo improved balance.  (target 02-07-15)   Time 4   Period Weeks   Status New   PT LONG TERM GOAL #2   Title Improve  DHI score to </= 45/100 to demo improvement in vertigo (02-07-15)   Baseline 60% on 01-08-15   Time 4   Period Weeks   Status New   PT LONG TERM GOAL #3   Title Report at least 50% improvement in vertigo  (02-07-15)   Time 4   Period Weeks   Status New   PT LONG TERM GOAL #4   Title Independent in updated HEP as appropriate (02-07-15)   Time 4   Period Weeks   Status New               Plan - 02/04/15 1934    Clinical Impression Statement Pt reports increased vertigo with turns toward R side; able to decrease intensity with seated rest break   Pt will benefit from skilled therapeutic intervention in order to improve on the following deficits Abnormal gait;Decreased balance;Other (comment);Dizziness   Rehab Potential Good   Clinical Impairments Affecting Rehab Potential migraines   PT Frequency 2x / week   PT Duration 4 weeks   PT Treatment/Interventions Therapeutic activities;Patient/family  education;Therapeutic exercise;Gait training;Balance training;Neuromuscular re-education;Stair training;Vestibular;ADLs/Self Care Home Management;Functional mobility training   PT Next Visit Plan continue habituation exercises   PT Home Exercise Plan gaze stabilization; balance on foam - previously instructed   Consulted and Agree with Plan of Care Patient        Problem List Patient Active Problem List   Diagnosis Date Noted  . Chronic migraine without aura, with status migrainosus 09/26/2014  . URI (upper respiratory infection) 02/10/2014  . Migraine 01/03/2014  . Dizziness 11/09/2013  . Adjustment disorder with mixed anxiety and depressed mood 11/03/2013  . Migraine with status migrainosus 10/26/2013  . Migraine variant 10/10/2013  . Disturbance of skin sensation 10/10/2013  . UTI (lower urinary tract infection) 10/10/2013  . TIA (transient ischemic attack) 10/10/2013  . HTN (hypertension) 07/18/2013  . 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 Neuralgia, neuritis, and radiculitis, unspecified 02/04/2012  . Intractable migraine without aura 02/04/2012  . 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 Myofascial muscle pain 07/15/2011    Alda Lea, PT 02/04/2015, 7:38 PM  [facility] [address] [place], [place], [address] Phone: [phone]   Fax:  [phone]  Name: TAJUANNA PENSON MRN: [id number] Date of Birth: 1950-12-09

## 2015-02-05 ENCOUNTER — Encounter: Payer: Self-pay | Admitting: Physical Therapy

## 2015-02-05 ENCOUNTER — Ambulatory Visit: Payer: No Typology Code available for payment source | Admitting: Physical Therapy

## 2015-02-05 DIAGNOSIS — R269 Unspecified abnormalities of gait and mobility: Secondary | ICD-10-CM

## 2015-02-05 DIAGNOSIS — R42 Dizziness and giddiness: Secondary | ICD-10-CM | POA: Diagnosis not present

## 2015-02-05 NOTE — Therapy (Signed)
[facility] [address] [place], [place], [address] Phone: [phone]   Fax:  [phone]  Physical Therapy Treatment  Patient Details  Name: [name] MRN: [id number] Date of Birth: 11-02-1950 Referring Provider: Dr. Georges Lynch  Encounter Date: 02/05/2015      PT End of Session - 02/05/15 1338    Visit Number 5   Date for PT Re-Evaluation 02/07/15   Authorization Type coventry   PT Start Time 0800   PT Stop Time 0845   PT Time Calculation (min) 45 min   Equipment Utilized During Treatment Gait belt      Past Medical History  Diagnosis Date  . Hyperlipidemia   . Migraine headache   . GERD (gastroesophageal reflux disease)   . Occipital neuralgia     Left  . Chronic kidney disease     stage 3  . Stroke [facility]) 10/2013    Past Surgical History  Procedure Laterality Date  . Abdominal hysterectomy    . Knee surgery    . Carpal tunnel release    . Elbow surgery    . Lung surgery    . Bladder repair    . Cervical spine surgery      There were no vitals filed for this visit.  Visit Diagnosis:  Dizziness and giddiness  Abnormality of gait      Subjective Assessment - 02/05/15 0924    Subjective Pt reports dizziness continues to occur with movement and is worse with quick turns toward right side; reports intensity averages 4-6/10 depending on activity   Pertinent History TIA's - 10-10-13; migraine headaches x 30 yrs   Diagnostic tests MRI, CT WNLs per patient report.   Patient Stated Goals Resolve the vertigo; improve balance   Currently in Pain? Yes   Pain Score 4    Pain Location Head   Pain Orientation Right   Pain Descriptors / Indicators Aching   Pain Type Chronic pain   Pain Onset More than a month ago   Pain Frequency Constant                         [facility] Adult PT Treatment/Exercise - 02/05/15 0001    Knee/Hip Exercises: Aerobic   Elliptical 1" forward; 30 secs  backward at level 1.5             Balance Exercises - 02/05/15 1335    Balance Exercises: Standing   Standing Eyes Opened Narrow base of support (BOS);Wide (BOA);Head turns;Foam/compliant surface;5 reps   Standing Eyes Closed Wide (BOA);Narrow base of support (BOS);Foam/compliant surface;Head turns;Solid surface;5 reps   Tandem Stance Eyes open  on blue mat on incline with head turns   Gait with Head Turns Forward;1 rep  120' around track   Turning Right;Left;5 reps  placing cones from floor up into cabinet   Other Standing Exercises sit to stand with pivot turn to R and L sides x 5 reps each     Standing in corner on blue foam - touching wall diagonally 5 reps each; touching across with R/L hand x 5 reps with CGA Figure 8 with ball between legs on blue mat - with 180 degree turn x 5 reps with CGA to min assist Sit to stand with pivot turn to R and L sides x 5 reps each - with CGA  Amb. with abrupt stop and turn - total distance approx. 125' with CGA - this activity provoked moderate  vertigo         PT Long Term Goals - 01/09/15 1341    PT LONG TERM GOAL #1   Title Increase SOT score by at least 20 points for composite score to demo improved balance.  (target 02-07-15)   Time 4   Period Weeks   Status New   PT LONG TERM GOAL #2   Title Improve DHI score to </= 45/100 to demo improvement in vertigo (02-07-15)   Baseline 60% on 01-08-15   Time 4   Period Weeks   Status New   PT LONG TERM GOAL #3   Title Report at least 50% improvement in vertigo  (02-07-15)   Time 4   Period Weeks   Status New   PT LONG TERM GOAL #4   Title Independent in updated HEP as appropriate (02-07-15)   Time 4   Period Weeks   Status New               Plan - 02/05/15 1339    Clinical Impression Statement Pt continues to report > vertigo with turn toward R side and with bending down and back up; reports incr. intensity to 6/10 and reports it settles back down to 4/10 with seated  rest break   Pt will benefit from skilled therapeutic intervention in order to improve on the following deficits Abnormal gait;Decreased balance;Other (comment);Dizziness   Rehab Potential Good   Clinical Impairments Affecting Rehab Potential migraines   PT Frequency 2x / week   PT Duration 4 weeks   PT Treatment/Interventions Therapeutic activities;Patient/family education;Therapeutic exercise;Gait training;Balance training;Neuromuscular re-education;Stair training;Vestibular;ADLs/Self Care Home Management;Functional mobility training   PT Next Visit Plan continue habituation exercises   PT Home Exercise Plan gaze stabilization; balance on foam - previously instructed   Consulted and Agree with Plan of Care Patient        Problem List Patient Active Problem List   Diagnosis Date Noted  . Chronic migraine without aura, with status migrainosus 09/26/2014  . URI (upper respiratory infection) 02/10/2014  . Migraine 01/03/2014  . Dizziness 11/09/2013  . Adjustment disorder with mixed anxiety and depressed mood 11/03/2013  . Migraine with status migrainosus 10/26/2013  . Migraine variant 10/10/2013  . Disturbance of skin sensation 10/10/2013  . UTI (lower urinary tract infection) 10/10/2013  . TIA (transient ischemic attack) 10/10/2013  . HTN (hypertension) 07/18/2013  . 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 Neuralgia, neuritis, and radiculitis, unspecified 02/04/2012  . Intractable migraine without aura 02/04/2012  . 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 Myofascial muscle pain 07/15/2011    Alda Lea, PT 02/05/2015, 1:44 PM  [facility] [address] [place], [place], [address] Phone: [phone]   Fax:  [phone]  Name: [name] MRN: [id number] Date of Birth: Mar 22, 1950

## 2015-02-06 ENCOUNTER — Ambulatory Visit: Payer: Self-pay | Admitting: Family Medicine

## 2015-02-09 ENCOUNTER — Encounter: Payer: Self-pay | Admitting: Rehabilitative and Restorative Service Providers"

## 2015-02-16 ENCOUNTER — Encounter: Payer: Self-pay | Admitting: Rehabilitative and Restorative Service Providers"

## 2015-02-19 ENCOUNTER — Ambulatory Visit (INDEPENDENT_AMBULATORY_CARE_PROVIDER_SITE_OTHER): Payer: No Typology Code available for payment source | Admitting: Family Medicine

## 2015-02-19 ENCOUNTER — Ambulatory Visit: Payer: No Typology Code available for payment source | Attending: Family Medicine | Admitting: Physical Therapy

## 2015-02-19 ENCOUNTER — Encounter: Payer: Self-pay | Admitting: Family Medicine

## 2015-02-19 VITALS — BP 110/58 | HR 68 | Temp 98.2°F | Ht 67.0 in | Wt 158.9 lb

## 2015-02-19 DIAGNOSIS — F4323 Adjustment disorder with mixed anxiety and depressed mood: Secondary | ICD-10-CM

## 2015-02-19 DIAGNOSIS — G43809 Other migraine, not intractable, without status migrainosus: Secondary | ICD-10-CM | POA: Diagnosis not present

## 2015-02-19 DIAGNOSIS — R269 Unspecified abnormalities of gait and mobility: Secondary | ICD-10-CM | POA: Insufficient documentation

## 2015-02-19 DIAGNOSIS — R42 Dizziness and giddiness: Secondary | ICD-10-CM | POA: Diagnosis present

## 2015-02-19 MED ORDER — SERTRALINE HCL 25 MG PO TABS: 50.0000 mg | ORAL_TABLET | Freq: Every day | ORAL | Status: DC

## 2015-02-19 NOTE — Patient Instructions (Addendum)
It was a pleasure seeing you today in our clinic. Today we discussed your rehabilitation and anxiety. Here is the treatment plan we have discussed and agreed upon together:   - Today I've increased your Zoloft from 25 mg to 50 mg. This means I would like you to take 2 tablets once a day. I would like to have you follow-up with me on whether or not you experience any benefit from this increase over the next 3-4 weeks. - Follow up with me in 6 weeks.

## 2015-02-19 NOTE — Progress Notes (Signed)
   HPI  CC: med check No significant changes. Fallen twice last week, believes this is due to fatigue and carelessness. Dizziness continues to be worse w/ bending over. Sudden turns make things worse. Right side is worse then left. Weakness on right persists (but improved). She has been undergoing the neuro rehab for some time. She has had a lot of improvement from this but her time w/ that program is winding down.   Patient also mentioned that she doesn't think she can return to work, even part time. 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 She describes a significant amount of anxiety that builds over time during those work days.   ROS: denies confusion, new weakness, numbness, paresthesias, SOB, or CP.   Objective: BP 110/58 mmHg  Pulse 68  Temp(Src) 98.2 F (36.8 C) (Oral)  Ht 5\' 7"  (1.702 m)  Wt 158 lb 14.4 oz (72.077 kg)  BMI 24.88 kg/m2 Gen: NAD, alert, cooperative, and pleasant. HEENT: NCAT, EOMI, PERRL CV: RRR, no murmur Resp: CTAB, no wheezes, non-labored Ext: No edema, warm Neuro: Alert and oriented, sensation diminished on entire right side compared to left. Strength 5/5 on left side, strength 4/5 on right side (throughout all muscle groups). Gait at baseline. Toe walk and heel walk both normal  Assessment and plan:  Migraine variant Stable/improving: Patient is undergoing neuro-rehab. She fell 2 times since our last visit but the cause seems to be due to her baseline symptoms and not paying attention. No new changes in status. Physical exam unchanged from prior.  - continue neuro rehab until cleared (this should be soon) - will monitor  Adjustment disorder with mixed anxiety and depressed mood Patient describing increased anxiety/depression when at work (1 day a week).  - will increase Zoloft to 50mg  daily and reassess - may need med specifically for anxiety (benzo  would NOT be appropriate for this patient)    Meds ordered this encounter  Medications  . sertraline (ZOLOFT) 25 MG tablet    Sig: Take 2 tablets (50 mg total) by mouth daily.    Dispense:  180 tablet    Refill:  0     Elberta Leatherwood, MD,MS,  PGY2 02/20/2015 4:03 PM

## 2015-02-20 NOTE — Assessment & Plan Note (Signed)
Patient describing increased anxiety/depression when at work (1 day a week).  - will increase Zoloft to 50mg  daily and reassess - may need med specifically for anxiety (benzo would NOT be appropriate for this patient)

## 2015-02-20 NOTE — Assessment & Plan Note (Signed)
Stable/improving: Patient is undergoing neuro-rehab. She fell 2 times since our last visit but the cause seems to be due to her baseline symptoms and not paying attention. No new changes in status. Physical exam unchanged from prior.  - continue neuro rehab until cleared (this should be soon) - will monitor

## 2015-02-22 ENCOUNTER — Encounter: Payer: Self-pay | Admitting: Physical Therapy

## 2015-02-25 ENCOUNTER — Encounter: Payer: Self-pay | Admitting: Physical Therapy

## 2015-02-25 NOTE — Therapy (Signed)
[facility] [address] [place], [place], [address] Phone: [phone]   Fax:  [phone]  Physical Therapy Treatment  Patient Details  Name: [name] MRN: [id number] Date of Birth: 1950-08-18 Referring Provider: Dr. Georges Lynch  Encounter Date: 02/19/2015      PT End of Session - 02/25/15 1942    Visit Number 7   Date for PT Re-Evaluation 02/07/15   Authorization Type coventry   PT Start Time 0801   PT Stop Time 0845   PT Time Calculation (min) 44 min      Past Medical History  Diagnosis Date  . Hyperlipidemia   . Migraine headache   . GERD (gastroesophageal reflux disease)   . Occipital neuralgia     Left  . Chronic kidney disease     stage 3  . Stroke [facility]) 10/2013    Past Surgical History  Procedure Laterality Date  . Abdominal hysterectomy    . Knee surgery    . Carpal tunnel release    . Elbow surgery    . Lung surgery    . Bladder repair    . Cervical spine surgery      There were no vitals filed for this visit.  Visit Diagnosis:  Dizziness and giddiness  Abnormality of gait     Neuro Re-ed; SOT score 70/100 (WNL's); Normal visual, vestibular, and somatosensory inputs in maintaining balance  TUG score 8.22 secs with c/o dizziness with turning Sharpened Romberg EC 6.09 secs with L foot in front;  R foot in front 7.00 secs  L SLS = 16.19 secs   R SLS= 25.41  Gait velocity = 32.8 ft/ 7.06 secs = 4.65 ft/sec (N walking speed)   Dynamic visual acuity = 3 line difference (N= </= 2 line difference)    Dizziness Handicap Index score 54%                        PT Long Term Goals - 02/25/15 1943    PT LONG TERM GOAL #1   Title Increase SOT score by at least 20 points for composite score to demo improved balance.  (target 02-07-15)   Baseline 02-19-15 -- composite score 70/100 on SOT   Status Partially Met   PT LONG TERM GOAL #3   Title Report at least  50% improvement in vertigo  (02-07-15)   Baseline inconsistent intensity reported   Status Not Met   PT LONG TERM GOAL #4   Title Independent in updated HEP as appropriate (02-07-15)   Status Achieved               Plan - 02/25/15 1945    Clinical Impression Statement Pt has met 3/3 LTG's - Sensory Organization test score 70/100 (WNL's) with normal visual, somtaosensory, and vestibular inputs in maintaining balance; pt cont to report vertigo with quick turns   Pt will benefit from skilled therapeutic intervention in order to improve on the following deficits Abnormal gait;Decreased balance;Other (comment);Dizziness   Rehab Potential Good   Clinical Impairments Affecting Rehab Potential migraines   PT Treatment/Interventions Therapeutic activities;Patient/family education;Therapeutic exercise;Gait training;Balance training;Neuromuscular re-education;Stair training;Vestibular;ADLs/Self Care Home Management;Functional mobility training   PT Next Visit Plan N/A - D/C   PT Home Exercise Plan gaze stabilization; balance on foam - previously instructed   Consulted and Agree with Plan of Care Patient        Problem List Patient Active Problem List   Diagnosis Date  Noted  . Chronic migraine without aura, with status migrainosus 09/26/2014  . Dizziness 11/09/2013  . Adjustment disorder with mixed anxiety and depressed mood 11/03/2013  . Migraine variant 10/10/2013  . Disturbance of skin sensation 10/10/2013  . TIA (transient ischemic attack) 10/10/2013  . HTN (hypertension) 07/18/2013  . 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 Neuralgia, neuritis, and radiculitis, unspecified 02/04/2012  . 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 Myofascial muscle pain 07/15/2011   PHYSICAL THERAPY DISCHARGE SUMMARY  Visits from Start of Care: 7  Current functional level related to goals / functional outcomes: Pt has met all LTG's except #3 with pt continuing to c/o vertigo at intensity 5/10  with turning; pt states that vertigo intensity varies depending on activity -  says it is much worse with turning toward R side   Remaining deficits: Continued c/o vertigo - see comment above   Education / Equipment: Pt has been instructed in a HEP consisting of balance and vestibular exercises - pt reports compliance with this program. Plan: Patient agrees to discharge.  Patient goals were partially met. Patient is being discharged due to meeting the stated rehab goals.  ?????       Alda Lea, PT 02/25/2015, 7:59 PM  [facility] [address] [place], [place], [address] Phone: [phone]   Fax:  [phone]  Name: [name] MRN: [id number] Date of Birth: [birth year]

## 2015-03-11 DIAGNOSIS — I2699 Other pulmonary embolism without acute cor pulmonale: Secondary | ICD-10-CM

## 2015-03-11 HISTORY — DX: Other pulmonary embolism without acute cor pulmonale: I26.99

## 2015-04-02 ENCOUNTER — Encounter: Payer: Self-pay | Admitting: Family Medicine

## 2015-04-02 ENCOUNTER — Ambulatory Visit (INDEPENDENT_AMBULATORY_CARE_PROVIDER_SITE_OTHER): Payer: BLUE CROSS/BLUE SHIELD | Admitting: Family Medicine

## 2015-04-02 VITALS — BP 100/50 | HR 65 | Temp 98.3°F | Ht 67.0 in | Wt 159.0 lb

## 2015-04-02 DIAGNOSIS — Z114 Encounter for screening for human immunodeficiency virus [HIV]: Secondary | ICD-10-CM | POA: Diagnosis not present

## 2015-04-02 DIAGNOSIS — E785 Hyperlipidemia, unspecified: Secondary | ICD-10-CM | POA: Diagnosis not present

## 2015-04-02 DIAGNOSIS — Z1159 Encounter for screening for other viral diseases: Secondary | ICD-10-CM | POA: Diagnosis not present

## 2015-04-02 DIAGNOSIS — F4323 Adjustment disorder with mixed anxiety and depressed mood: Secondary | ICD-10-CM | POA: Diagnosis not present

## 2015-04-02 LAB — HIV ANTIBODY (ROUTINE TESTING W REFLEX): HIV 1&2 Ab, 4th Generation: NONREACTIVE

## 2015-04-02 MED ORDER — SERTRALINE HCL 25 MG PO TABS: 75.0000 mg | ORAL_TABLET | Freq: Every day | ORAL | Status: DC

## 2015-04-02 NOTE — Patient Instructions (Signed)
It was a pleasure seeing you today in our clinic. Today we discussed your medications and physical therapy. Here is the treatment plan we have discussed and agreed upon together:   - Continue doing her home physical therapy. - I have increased her Zoloft dosing to 75 mg (3 tablets) daily. - I have ordered some standard lab screens to be obtained today.

## 2015-04-02 NOTE — Assessment & Plan Note (Signed)
Patient was recently started on statin therapy. She endorses good compliance and little to no side effects at this time. - Continue atorvastatin 40 mg daily.

## 2015-04-02 NOTE — Assessment & Plan Note (Signed)
Patient is reporting increased irritability and anxiety over the past couple months. She states that her Zoloft has been very beneficial but she feels as though she may need an increase in her dosing. Current dose is 50 mg daily. - I've increased her Zoloft dose to 75 mg daily.  - I would like to have her follow-up with me in one month.

## 2015-04-02 NOTE — Progress Notes (Signed)
   HPI  CC: medication follow-up. Patient is here for follow-up after initiating a new medication. She was recently started on atorvastatin 40 mg daily for hyperlipidemia.  Patient states that she has been highly compliant with this medication and she has had relatively little to no side effects. She does endorse some occasional lower extremity soreness, which lasts only a couple hours before bed. This is not very bothersome to her and she states that she has only noticed it once or twice.  Patient does endorse some increased anxiety and irritability. She states that she has to frequently separate herself from specific situations which are "testing" (typically involving other people who she does not feel as though they are doing their jobs). She is wondering if there is anything we do about this at this time. No SI/HI.no feelings of hopelessness or worthlessness.  Review of Systems   See HPI for ROS. All other systems reviewed and are negative.  Past medical history and social history reviewed and updated in the EMR as appropriate.  Objective: BP 100/50 mmHg  Pulse 65  Temp(Src) 98.3 F (36.8 C) (Oral)  Ht 5\' 7"  (1.702 m)  Wt 159 lb (72.122 kg)  BMI 24.90 kg/m2  SpO2 98% Gen: NAD, alert, cooperative, and pleasant. CV: RRR Ext: no edema, peripheral pulses intact bilaterally  Assessment and plan:  HLD (hyperlipidemia) Patient was recently started on statin therapy. She endorses good compliance and little to no side effects at this time. - Continue atorvastatin 40 mg daily.  Adjustment disorder with mixed anxiety and depressed mood Patient is reporting increased irritability and anxiety over the past couple months. She states that her Zoloft has been very beneficial but she feels as though she may need an increase in her dosing. Current dose is 50 mg daily. - I've increased her Zoloft dose to 75 mg daily.  - I would like to have her follow-up with me in one month.    Orders Placed  This Encounter  Procedures  . Hepatitis C antibody  . HIV antibody    Meds ordered this encounter  Medications  . sertraline (ZOLOFT) 25 MG tablet    Sig: Take 3 tablets (75 mg total) by mouth daily.    Dispense:  270 tablet    Refill:  0     Elberta Leatherwood, MD,MS,  PGY2 04/02/2015 11:05 AM

## 2015-04-03 ENCOUNTER — Encounter: Payer: Self-pay | Admitting: Family Medicine

## 2015-04-03 LAB — HEPATITIS C ANTIBODY: HCV Ab: NEGATIVE

## 2015-04-12 ENCOUNTER — Telehealth: Payer: Self-pay

## 2015-05-07 ENCOUNTER — Other Ambulatory Visit: Payer: Self-pay | Admitting: Neurology

## 2015-05-15 ENCOUNTER — Telehealth: Payer: Self-pay | Admitting: *Deleted

## 2015-05-15 ENCOUNTER — Ambulatory Visit: Payer: Self-pay | Admitting: Physical Medicine & Rehabilitation

## 2015-05-15 NOTE — Telephone Encounter (Signed)
Lidocaine 5% patches approved by insurance

## 2015-05-18 ENCOUNTER — Encounter: Payer: Self-pay | Admitting: Physical Medicine & Rehabilitation

## 2015-05-18 ENCOUNTER — Encounter: Payer: BLUE CROSS/BLUE SHIELD | Attending: Physical Medicine & Rehabilitation

## 2015-05-18 ENCOUNTER — Ambulatory Visit (HOSPITAL_BASED_OUTPATIENT_CLINIC_OR_DEPARTMENT_OTHER): Payer: BLUE CROSS/BLUE SHIELD | Admitting: Physical Medicine & Rehabilitation

## 2015-05-18 VITALS — BP 108/55 | HR 84 | Resp 14

## 2015-05-18 DIAGNOSIS — G588 Other specified mononeuropathies: Secondary | ICD-10-CM

## 2015-05-18 DIAGNOSIS — K219 Gastro-esophageal reflux disease without esophagitis: Secondary | ICD-10-CM | POA: Insufficient documentation

## 2015-05-18 DIAGNOSIS — Z79899 Other long term (current) drug therapy: Secondary | ICD-10-CM | POA: Diagnosis present

## 2015-05-18 DIAGNOSIS — Z9889 Other specified postprocedural states: Secondary | ICD-10-CM | POA: Insufficient documentation

## 2015-05-18 DIAGNOSIS — E785 Hyperlipidemia, unspecified: Secondary | ICD-10-CM | POA: Diagnosis not present

## 2015-05-18 DIAGNOSIS — N189 Chronic kidney disease, unspecified: Secondary | ICD-10-CM | POA: Diagnosis not present

## 2015-05-18 DIAGNOSIS — M5481 Occipital neuralgia: Secondary | ICD-10-CM | POA: Insufficient documentation

## 2015-05-18 DIAGNOSIS — Z8673 Personal history of transient ischemic attack (TIA), and cerebral infarction without residual deficits: Secondary | ICD-10-CM | POA: Diagnosis not present

## 2015-05-18 DIAGNOSIS — G43909 Migraine, unspecified, not intractable, without status migrainosus: Secondary | ICD-10-CM | POA: Insufficient documentation

## 2015-05-18 DIAGNOSIS — G548 Other nerve root and plexus disorders: Secondary | ICD-10-CM | POA: Diagnosis not present

## 2015-05-18 MED ORDER — GABAPENTIN 600 MG PO TABS: 600.0000 mg | ORAL_TABLET | Freq: Two times a day (BID) | ORAL | Status: DC

## 2015-05-18 MED ORDER — LIDOCAINE 5 % EX PTCH: 2.0000 | MEDICATED_PATCH | CUTANEOUS | Status: DC

## 2015-05-18 NOTE — Patient Instructions (Signed)
Ask Dr. Jannifer Franklin or one of your other doctors whether they feel comfortable prescribing her gabapentin and Lidoderm patch

## 2015-05-18 NOTE — Progress Notes (Signed)
Subjective:    Patient ID: [name], female    DOB: January 20, 1951, 65 y.o.   MRN: [id number]  HPI Gabapentin Still helpful for intercostal neuralgia. Patient has had treatment for benign positional vertigo over the last year. Symptoms have partially resolved but still needs to turn her Head slowly or risk having symptoms. No other hospitalizations since last visit 05/15/2014.  No side effects from gabapentin, 600 mg 3 times per day. Using 2 Lidoderm patches during the day  Patient has not been working since stroke/TIA 8 04 2015  Pain Inventory Average Pain 8 Pain Right Now 9 My pain is sharp, burning, dull and aching  In the last 24 hours, has pain interfered with the following? General activity 9 Relation with others 9 Enjoyment of life 9 What TIME of day is your pain at its worst? no selection Sleep (in general) Poor  Pain is worse with: walking, bending and sitting Pain improves with: heat/ice and medication Relief from Meds: 7  Mobility walk without assistance how many minutes can you walk? 45 ability to climb steps?  yes do you drive?  yes Do you have any goals in this area?  no  Function disabled: date disabled . Do you have any goals in this area?  no  Neuro/Psych No problems in this area  Prior Studies Any changes since last visit?  no  Physicians involved in your care Any changes since last visit?  no   Family History  Problem Relation Age of Onset  . Cancer Mother 33    breast  . Alzheimer's disease Mother   . Cancer Brother   . Hyperlipidemia Brother   . Prostate cancer Father   . Lung cancer Father    Social History   Social History  . Marital Status: Married    Spouse Name: [name]  . Number of Children: 1  . Years of Education: 12+   Occupational History  . ACTIVITY DIRECTOR   . CNA    Social History Main Topics  . Smoking status: Former Smoker -- 0.30 packs/day    Types: Cigarettes    Quit date: 11/01/2011  . Smokeless  tobacco: Never Used  . Alcohol Use: No  . Drug Use: No  . Sexual Activity:    Partners: Male    Birth Control/ Protection: Surgical     Comment: 1 sexual partner in last 12 months: married   Other Topics Concern  . None   Social History Narrative   Patient is married ([name]).   Patient has one child.   Patient is working full-time.   Patient is right handed.   Patient has a college education.   Patient does not drink caffeine.   Past Surgical History  Procedure Laterality Date  . Abdominal hysterectomy    . Knee surgery    . Carpal tunnel release    . Elbow surgery    . Lung surgery    . Bladder repair    . Cervical spine surgery     Past Medical History  Diagnosis Date  . Hyperlipidemia   . Migraine headache   . GERD (gastroesophageal reflux disease)   . Occipital neuralgia     Left  . Chronic kidney disease     stage 3  . Stroke ([place]) 10/2013   BP 108/55 mmHg  Pulse 84  Resp 14  SpO2 97%  Opioid Risk Score:   Fall Risk Score:  `1  Depression screen PHQ 2/9  Depression screen PHQ  2/9 05/18/2015 04/02/2015 02/19/2015 12/25/2014 08/09/2014 04/07/2014 03/06/2014  Decreased Interest 1 0 1 0 0 1 0  Down, Depressed, Hopeless 1 0 1 0 0 1 0  PHQ - 2 Score 2 0 2 0 0 2 0  Altered sleeping 3 - - - - 3 -  Tired, decreased energy 3 - - - - 3 -  Change in appetite 0 - - - - 3 -  Feeling bad or failure about yourself  3 - - - - 1 -  Trouble concentrating 3 - - - - 3 -  Moving slowly or fidgety/restless 3 - - - - 3 -  Suicidal thoughts 0 - - - - 0 -  PHQ-9 Score 17 - - - - 18 -  Difficult doing work/chores Somewhat difficult - - - - - -    Review of Systems  All other systems reviewed and are negative.      Objective:   Physical Exam  Constitutional: She is oriented to person, place, and time. She appears well-developed and well-nourished.  HENT:  Head: Normocephalic and atraumatic.  Eyes: Conjunctivae and EOM are normal. Pupils are equal, round, and reactive to  light.  Neck: Normal range of motion.  Musculoskeletal: Normal range of motion.  Neurological: She is alert and oriented to person, place, and time.  Psychiatric: She has a normal mood and affect.  Nursing note and vitals reviewed.  Tenderness from T8-T12 at the mid axillary line on the left side extending both anteriorly and posteriorly to the anterior axillary and posterior axillary line  Decreased sensation over this area but with light palpation there is pain  Upper extremity lower extremity strength 5/5       Assessment & Plan:  1. 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 Recommend continued gabapentin 600 mg 3 times a day Recommend continued Lidoderm patch 2 patches on 12 hours off 12h  Physical medicine and rehabilitation follow-up in one year

## 2015-05-21 ENCOUNTER — Encounter: Payer: Self-pay | Admitting: Family Medicine

## 2015-05-21 ENCOUNTER — Ambulatory Visit (INDEPENDENT_AMBULATORY_CARE_PROVIDER_SITE_OTHER): Payer: BLUE CROSS/BLUE SHIELD | Admitting: Family Medicine

## 2015-05-21 VITALS — BP 89/58 | HR 73 | Temp 98.0°F | Ht 67.0 in | Wt 165.0 lb

## 2015-05-21 DIAGNOSIS — I951 Orthostatic hypotension: Secondary | ICD-10-CM | POA: Diagnosis not present

## 2015-05-21 DIAGNOSIS — J019 Acute sinusitis, unspecified: Secondary | ICD-10-CM

## 2015-05-21 MED ORDER — AMOXICILLIN-POT CLAVULANATE 875-125 MG PO TABS: 1.0000 | ORAL_TABLET | Freq: Two times a day (BID) | ORAL | Status: DC

## 2015-05-21 NOTE — Progress Notes (Signed)
Date of Visit: 05/21/2015   HPI:  Patient presents for a same day appointment to discuss sinus symptoms.  Has been sick for 10 days. Initially thought it was a cold or allergies but has gotten worse and still not any better. R side of nose especially congested with green mucous. Facial pain present. Highest temp at home 99.9. Has tried alka seltzer sinus and nyquil. Stooling and urinating normally except for mild loose stools.    Denies dizziness or lightheadedness except when she bends over to reach down, at that time feels lots of pressure in head. Blood pressure notably low today. Patient reports this always happens when she gets sick. Denies wobbliness or falling. Has laid in bed the last few days. Takes ramipril and lasix, took both of those this morning.  ROS: See HPI  [place]: history of anxiety/depression, chronic migraine, hyperlipidemia, hypertension, post-thoracotomy pain syndrome, TIA  PHYSICAL EXAM: BP 89/58 mmHg  Pulse 73  Temp(Src) 98 F (36.7 C) (Oral)  Ht 5\' 7"  (1.702 m)  Wt 165 lb (74.844 kg)  BMI 25.84 kg/m2 Gen: NAD, pleasant, cooperative HEENT: normocephalic, atraumatic. Nares with congestion present. Facial sinus tenderness present. Anterior cervical lymphadenopathy present. Tympanic membranes normal bilaterally, moist mucous membranes  Heart: regular rate and rhythm, no murmur Lungs: clear to auscultation bilaterally, normal work of breathing  Neuro: alert, gait normal, speech normal  Orthostatic VS for the past 24 hrs (Last 3 readings):  BP- Lying Pulse- Lying BP- Sitting Pulse- Sitting BP- Standing at 0 minutes Pulse- Standing at 0 minutes  05/21/15 1115 (!) 82/51 mmHg 71 (!) 77/52 mmHg 67 (!) 68/27 mmHg 75     ASSESSMENT/PLAN:  1. Sinusitis - classic presentation for acute sinusitis. Will rx augmentin to treat. BP notably low. Orthostatics positive, with blood pressure of 68/27 on standing. Patient reports this is chronic whenever she is ill, and denies  being symptomatic from it. Discussed role of admission to hospital with low blood pressures in setting of infection, but patient does not think she needs admission at this time. Ambulates normally and is not ill appearing. Afebrile and not tachycardic, so doubt sepsis at this time. - I have asked her to hold ramipril and lasix for the next couple of days.  - She will return on Wednesday for office visit to recheck blood pressure.  - Extensively discussed reasons to call us, return to clinic sooner, or go to ED - Note that above discussion not documented in patient instructions, as after visit summary already printed prior to result of orthostatics. Patient stated verbal understanding of instructions.  FOLLOW UP: Follow up in 2 days for blood pressure check & recheck sinusitis.  [place]. Ardelia Mems, [place]

## 2015-05-21 NOTE — Patient Instructions (Signed)
Take augmentin 1 pill twice daily for 10 days Return if not getting better with this  Be well, Dr. Ardelia Mems   Sinusitis, Adult Sinusitis is redness, soreness, and inflammation of the paranasal sinuses. Paranasal sinuses are air pockets within the bones of your face. They are located beneath your eyes, in the middle of your forehead, and above your eyes. In healthy paranasal sinuses, mucus is able to drain out, and air is able to circulate through them by way of your nose. However, when your paranasal sinuses are inflamed, mucus and air can become trapped. This can allow bacteria and other germs to grow and cause infection. Sinusitis can develop quickly and last only a short time (acute) or continue over a long period (chronic). Sinusitis that lasts for more than 12 weeks is considered chronic. CAUSES Causes of sinusitis include:  Allergies.  Structural abnormalities, such as displacement of the cartilage that separates your nostrils (deviated septum), which can decrease the air flow through your nose and sinuses and affect sinus drainage.  Functional abnormalities, such as when the small hairs (cilia) that line your sinuses and help remove mucus do not work properly or are not present. SIGNS AND SYMPTOMS Symptoms of acute and chronic sinusitis are the same. The primary symptoms are pain and pressure around the affected sinuses. Other symptoms include:  Upper toothache.  Earache.  Headache.  Bad breath.  Decreased sense of smell and taste.  A cough, which worsens when you are lying flat.  Fatigue.  Fever.  Thick drainage from your nose, which often is green and may contain pus (purulent).  Swelling and warmth over the affected sinuses. DIAGNOSIS Your health care provider will perform a physical exam. During your exam, your health care provider may perform any of the following to help determine if you have acute sinusitis or chronic sinusitis:  Look in your nose for signs of  abnormal growths in your nostrils (nasal polyps).  Tap over the affected sinus to check for signs of infection.  View the inside of your sinuses using an imaging device that has a light attached (endoscope). If your health care provider suspects that you have chronic sinusitis, one or more of the following tests may be recommended:  Allergy tests.  Nasal culture. A sample of mucus is taken from your nose, sent to a lab, and screened for bacteria.  Nasal cytology. A sample of mucus is taken from your nose and examined by your health care provider to determine if your sinusitis is related to an allergy. TREATMENT Most cases of acute sinusitis are related to a viral infection and will resolve on their own within 10 days. Sometimes, medicines are prescribed to help relieve symptoms of both acute and chronic sinusitis. These may include pain medicines, decongestants, nasal steroid sprays, or saline sprays. However, for sinusitis related to a bacterial infection, your health care provider will prescribe antibiotic medicines. These are medicines that will help kill the bacteria causing the infection. Rarely, sinusitis is caused by a fungal infection. In these cases, your health care provider will prescribe antifungal medicine. For some cases of chronic sinusitis, surgery is needed. Generally, these are cases in which sinusitis recurs more than 3 times per year, despite other treatments. HOME CARE INSTRUCTIONS  Drink plenty of water. Water helps thin the mucus so your sinuses can drain more easily.  Use a humidifier.  Inhale steam 3-4 times a day (for example, sit in the bathroom with the shower running).  Apply a warm, moist  washcloth to your face 3-4 times a day, or as directed by your health care provider.  Use saline nasal sprays to help moisten and clean your sinuses.  Take medicines only as directed by your health care provider.  If you were prescribed either an antibiotic or antifungal  medicine, finish it all even if you start to feel better. SEEK IMMEDIATE MEDICAL CARE IF:  You have increasing pain or severe headaches.  You have nausea, vomiting, or drowsiness.  You have swelling around your face.  You have vision problems.  You have a stiff neck.  You have difficulty breathing.   This information is not intended to replace advice given to you by your health care provider. Make sure you discuss any questions you have with your health care provider.   Document Released: 02/24/2005 Document Revised: 03/17/2014 Document Reviewed: 03/11/2011 Elsevier Interactive Patient Education [employer].

## 2015-05-28 ENCOUNTER — Ambulatory Visit (INDEPENDENT_AMBULATORY_CARE_PROVIDER_SITE_OTHER): Payer: BLUE CROSS/BLUE SHIELD | Admitting: Adult Health

## 2015-05-28 ENCOUNTER — Ambulatory Visit (INDEPENDENT_AMBULATORY_CARE_PROVIDER_SITE_OTHER): Payer: BLUE CROSS/BLUE SHIELD | Admitting: *Deleted

## 2015-05-28 ENCOUNTER — Encounter: Payer: Self-pay | Admitting: Adult Health

## 2015-05-28 VITALS — BP 98/60 | HR 65

## 2015-05-28 VITALS — BP 100/68 | HR 77 | Resp 16 | Ht 67.0 in | Wt 166.0 lb

## 2015-05-28 DIAGNOSIS — G43019 Migraine without aura, intractable, without status migrainosus: Secondary | ICD-10-CM | POA: Diagnosis not present

## 2015-05-28 DIAGNOSIS — Z0130 Encounter for examination of blood pressure without abnormal findings: Secondary | ICD-10-CM

## 2015-05-28 DIAGNOSIS — Z136 Encounter for screening for cardiovascular disorders: Secondary | ICD-10-CM

## 2015-05-28 NOTE — Progress Notes (Signed)
   Patient in nurse clinic for blood pressure check.  Patient denies any symptoms other than migraine headache.  Patient stated provider is aware of her migraines.  Patient did not take any medicines prior to appointment.  Patient stated that her blood pressures has been running low since her stroke.  Will forward to PCP. Derl Barrow, RN Today's Vitals   05/28/15 0947  BP: 98/60  Pulse: 65  SpO2: 97%  PainSc: 0-No pain

## 2015-05-28 NOTE — Progress Notes (Signed)
I have read the note, and I agree with the clinical assessment and plan.  Davonta Stroot KEITH   

## 2015-05-28 NOTE — Patient Instructions (Signed)
Continue medication- no change Will try botox injections If your symptoms worsen or you develop new symptoms please let us know.

## 2015-05-28 NOTE — Progress Notes (Signed)
PATIENT: [name] DOB: 09/26/1950  REASON FOR VISIT: follow up HISTORY FROM: patient  HISTORY OF PRESENT ILLNESS: [name] is a 65 year old female with a history of intractable migraine headaches. She returns today for follow-up. The patient is on multiple medications without any benefit. At the last visit [name] started her on riboflavin, magnesium and increase her gabapentin to 600 mg twice a day. She reports that she received no benefit. The patient has at least 15 headache days a month. She has a daily headache that she rates a 5 out of 10 on the pain scale. She states that she has at least one severe headache a week. She reports photophobia and nausea but no vomiting. She knows that the smell of food is a trigger for her headaches. 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 The patient has been approved for Botox in the past however her insurance expired so she was unable to receive injections.  She is currently on doxepin, gabapentin, tizanidine, Topamax, riboflavin, magnesium, Zoloft and altace with no benefit. The patient is interested in using Botox again.  HISTORY 01/26/15 ([place]): [name] is a 64 year old right-handed white female with a history of intractable migraine headache. The patient is having daily headaches, the headaches may become quite severe at least once a week, she may have 4 to 5 incapacitating headaches a month. She reports some neck stiffness with headache. She has photophobia and phonophobia. Factors such as weather changes, and certain odors such as fish or perfumes may bring on headaches rapidly. In the past, IV Depacon may help alleviate a severe headache. The patient reports some neck stiffness with the headache. She will have nausea, no vomiting. The patient may have some blurring of vision, no loss of vision. 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 She was  to be set up for Botox injections, but apparently this was denied through her insurance. She indicates that her insurance will be changing at the beginning of the next year, we may consider reapplying for Botox at that time. The patient currently is on doxepin, gabapentin, tizanidine, and Topamax without much benefit. The patient is also on Zoloft and Altace. She returns to this office for an evaluation.    REVIEW OF SYSTEMS: Out of a complete 14 system review of symptoms, the patient complains only of the following symptoms, and all other reviewed systems are negative.  Memory loss, dizziness, headache, joint pain, muscle cramps, depression, restless leg, insomnia, eye pain, light sensitivity  ALLERGIES: Allergies  Allergen Reactions  . Shellfish Allergy Anaphylaxis and Other (See Comments)    All seafood    HOME MEDICATIONS: Outpatient Prescriptions Prior to Visit  Medication Sig Dispense Refill  . amoxicillin-clavulanate (AUGMENTIN) 875-125 MG tablet Take 1 tablet by mouth 2 (two) times daily. 20 tablet 0  . atorvastatin (LIPITOR) 40 MG tablet Take 1 tablet (40 mg total) by mouth daily. 90 tablet 3  . clopidogrel (PLAVIX) 75 MG tablet Take 1 tablet (75 mg total) by mouth daily. 30 tablet 11  . doxepin (SINEQUAN) 100 MG capsule Take 2 capsules (200 mg total) by mouth at bedtime. 180 capsule 1  . furosemide (LASIX) 20 MG tablet Take 20 mg by mouth daily.  6  . gabapentin (NEURONTIN) 600 MG tablet Take 1 tablet (600 mg total) by mouth 2 (two) times daily. 60 tablet 5  . lidocaine (LIDODERM) 5 %  Place 2 patches onto the skin daily. Remove & Discard patch within 12 hours or as directed by MD 60 patch 11  . ramipril (ALTACE) 5 MG tablet Take 5 mg by mouth daily.    . sertraline (ZOLOFT) 25 MG tablet Take 3 tablets (75 mg total) by mouth daily. 270 tablet 0  . tiZANidine (ZANAFLEX) 2 MG tablet Take 1 tablet (2 mg total) by mouth 3 (three) times daily. 270 tablet 1  . topiramate (TOPAMAX) 100  MG tablet TAKE 1 TABLET TWICE A DAY 180 tablet 0   No facility-administered medications prior to visit.    PAST MEDICAL HISTORY: Past Medical History  Diagnosis Date  . Hyperlipidemia   . Migraine headache   . GERD (gastroesophageal reflux disease)   . Occipital neuralgia     Left  . Chronic kidney disease     stage 3  . Stroke [facility]) 10/2013    PAST SURGICAL HISTORY: Past Surgical History  Procedure Laterality Date  . Abdominal hysterectomy    . Knee surgery    . Carpal tunnel release    . Elbow surgery    . Lung surgery    . Bladder repair    . Cervical spine surgery      FAMILY HISTORY: Family History  Problem Relation Age of Onset  . Cancer Mother 73    breast  . Alzheimer's disease Mother   . Cancer Brother   . Hyperlipidemia Brother   . Prostate cancer Father   . Lung cancer Father     SOCIAL HISTORY: Social History   Social History  . Marital Status: Married    Spouse Name: [name]  . Number of Children: 1  . Years of Education: 12+   Occupational History  . ACTIVITY DIRECTOR   . CNA    Social History Main Topics  . Smoking status: Former Smoker -- 0.30 packs/day    Types: Cigarettes    Quit date: 11/01/2011  . Smokeless tobacco: Never Used  . Alcohol Use: No  . Drug Use: No  . Sexual Activity:    Partners: Male    Birth Control/ Protection: Surgical     Comment: 1 sexual partner in last 12 months: married   Other Topics Concern  . Not on file   Social History Narrative   Patient is married ([name]).   Patient has one child.   Patient is working full-time.   Patient is right handed.   Patient has a college education.   Patient does not drink caffeine.      PHYSICAL EXAM  Filed Vitals:   05/28/15 0734  BP: 100/68  Pulse: 77  Resp: 16  Height: 5\' 7"  (1.702 m)  Weight: 166 lb (75.297 kg)   Body mass index is 25.99 kg/(m^2).  Generalized: Well developed, in no acute distress   Neurological examination  Mentation: Alert  oriented to time, place, history taking. Follows all commands speech and language fluent Cranial nerve II-XII: Pupils were equal round reactive to light. Extraocular movements were full, visual field were full on confrontational test. Facial sensation and strength were normal. Uvula tongue midline. Head turning and shoulder shrug  were normal and symmetric. Motor: The motor testing reveals 5 over 5 strength of all 4 extremities. Good symmetric motor tone is noted throughout.  Sensory: Sensory testing is intact to soft touch on all 4 extremities. The patient reports decreased sensation on the right side of the face and the arm. Vibration sensation is split midline. No  evidence of extinction is noted.  Coordination: Cerebellar testing reveals good finger-nose-finger and heel-to-shin bilaterally.  Gait and station: Gait is normal. Tandem gait is normal. Romberg is negative. No drift is seen.  Reflexes: Deep tendon reflexes are symmetric and normal bilaterally.   DIAGNOSTIC DATA (LABS, IMAGING, TESTING) - I reviewed patient records, labs, notes, testing and imaging myself where available.  Lab Results  Component Value Date   WBC 6.1 12/25/2014   HGB 14.1 12/25/2014   HCT 42.3 12/25/2014   MCV 86.7 12/25/2014   PLT 285 12/25/2014      Component Value Date/Time   NA 141 12/25/2014 1513   K 4.1 12/25/2014 1513   CL 106 12/25/2014 1513   CO2 24 12/25/2014 1513   GLUCOSE 88 12/25/2014 1513   BUN 17 12/25/2014 1513   CREATININE 1.13* 12/25/2014 1513   CREATININE 1.22* 10/12/2013 0512   CALCIUM 9.2 12/25/2014 1513   PROT 7.3 12/25/2014 1513   ALBUMIN 4.6 12/25/2014 1513   AST 20 12/25/2014 1513   ALT 17 12/25/2014 1513   ALKPHOS 97 12/25/2014 1513   BILITOT 0.4 12/25/2014 1513   GFRNONAA 43* 11/09/2013 1544   GFRNONAA 47* 10/12/2013 0512   GFRAA 50* 11/09/2013 1544   GFRAA 54* 10/12/2013 0512   Lab Results  Component Value Date   CHOL 279* 12/25/2014   HDL 56 12/25/2014   LDLCALC  184* 12/25/2014   TRIG 197* 12/25/2014   CHOLHDL 5.0 12/25/2014     Lab Results  Component Value Date   TSH 1.402 12/25/2014      ASSESSMENT AND PLAN 65 y.o. year old female  has a past medical history of Hyperlipidemia; Migraine headache; GERD (gastroesophageal reflux disease); Occipital neuralgia; Chronic kidney disease; and Stroke ([place]) (10/2013). here with:  1. Intractable Migraine headache  The patient has not received any benefit from medication for her migraines. She has greater than 15 headache days a month. She has tried and failed multiple medications. We will get the patient set up for Botox injections. Patient advised that if her symptoms worsen or she develops any new symptoms she should let us know. She will follow-up in 3-4 months or sooner if needed.     Ward Givens, MSN, NP-C 05/28/2015, 7:36 AM [facility] [address] [place], [place] [address] [phone]

## 2015-06-12 ENCOUNTER — Telehealth: Payer: Self-pay | Admitting: Adult Health

## 2015-06-12 NOTE — Telephone Encounter (Signed)
Pt returned Danielle's call °

## 2015-06-12 NOTE — Telephone Encounter (Signed)
Called patient to schedule for botox injection. Left a VM asking her to return my call.

## 2015-06-14 ENCOUNTER — Other Ambulatory Visit: Payer: Self-pay | Admitting: *Deleted

## 2015-06-14 MED ORDER — GABAPENTIN 600 MG PO TABS: 600.0000 mg | ORAL_TABLET | Freq: Two times a day (BID) | ORAL | Status: DC

## 2015-06-14 NOTE — Telephone Encounter (Signed)
Pt has gotten this from Dr.Kirstiens.  We follow for headaches.  Request for 90 day supply.

## 2015-06-18 ENCOUNTER — Ambulatory Visit (INDEPENDENT_AMBULATORY_CARE_PROVIDER_SITE_OTHER): Payer: BLUE CROSS/BLUE SHIELD | Admitting: Family Medicine

## 2015-06-18 ENCOUNTER — Encounter: Payer: Self-pay | Admitting: Family Medicine

## 2015-06-18 VITALS — BP 171/84 | HR 79 | Temp 98.1°F | Ht 67.0 in | Wt 161.4 lb

## 2015-06-18 DIAGNOSIS — I10 Essential (primary) hypertension: Secondary | ICD-10-CM

## 2015-06-18 MED ORDER — RAMIPRIL 2.5 MG PO CAPS: 2.5000 mg | ORAL_CAPSULE | Freq: Every day | ORAL | Status: DC

## 2015-06-18 NOTE — Assessment & Plan Note (Signed)
Patient had had significant hypotension at a previous visit. At that time she was asked to hold her antihypertensive medications. Today in follow-up her blood pressure is 171/84. After discussing the 2 medications she is on we have made the following changes. - Ramipril 2.5 mg daily. This is half the previous dose she had been on previously. - Lasix 20 mg. This is restarted from her previous dosing. - Follow-up in one month to check her blood pressure and to discuss recent worsening shortness of breath.

## 2015-06-18 NOTE — Progress Notes (Signed)
   HPI  CC: Hypertension Patient is here for a blood pressure check. She was recently seen in our clinic and noted to have hypotension. At that time her blood pressure medications were held (ramipril and Lasix). Patient denies any current symptoms however she does state that she had some slight lightheadedness at the previous visit when she was notably hypotensive. She denies any headache, vision changes, chest pain, nausea, vomiting, diarrhea.  Patient and I also discussed her driving, as patient was asking for me to sign a handicapped driving plate form today. I discussed with her the risks of her persistent vertigo and driving. I informed her that if she felt it was necessary for her to receive a handicap plate due to her vertigo then she should not be driving at all as she would be a risk to other drivers and herself. Patient stated that her vertigo was resolved and that is was for her shortness of breath.  Review of Systems   See HPI for ROS. All other systems reviewed and are negative.  CC, SH/smoking status, and VS noted  Objective: BP 171/84 mmHg  Pulse 79  Temp(Src) 98.1 F (36.7 C) (Oral)  Ht 5\' 7"  (1.702 m)  Wt 161 lb 6.4 oz (73.211 kg)  BMI 25.27 kg/m2 Gen: NAD, alert, cooperative, and pleasant. HEENT: NCAT, PERRL, no JVD CV: RRR Resp:  non-labored Neuro: Alert and oriented, Speech clear, No gross deficits  Assessment and plan:  HTN (hypertension) Patient had had significant hypotension at a previous visit. At that time she was asked to hold her antihypertensive medications. Today in follow-up her blood pressure is 171/84. After discussing the 2 medications she is on we have made the following changes. - Ramipril 2.5 mg daily. This is half the previous dose she had been on previously. - Lasix 20 mg. This is restarted from her previous dosing. - Follow-up in one month to check her blood pressure and to discuss recent worsening shortness of breath.    Meds ordered this  encounter  Medications  . ramipril (ALTACE) 2.5 MG capsule    Sig: Take 1 capsule (2.5 mg total) by mouth daily.    Dispense:  90 capsule    Refill:  0     Elberta Leatherwood, MD,MS,  PGY2 06/18/2015 6:07 PM

## 2015-06-18 NOTE — Patient Instructions (Signed)
It was a pleasure seeing you today in our clinic. Today we discussed your blood pressure. Here is the treatment plan we have discussed and agreed upon together:   - I have restarted your Ramipril at half its original dose. Take this once a day as you had previously. - Please begin taking your Lasix as you had previously. - I would like to see you back in one month to discuss your blood pressure as well as your shortness of breath.

## 2015-06-26 NOTE — Telephone Encounter (Signed)
Waiting on approval to place on megans schedule, will call back once that has been obtained.

## 2015-07-13 ENCOUNTER — Encounter: Payer: Self-pay | Admitting: Family Medicine

## 2015-07-13 ENCOUNTER — Ambulatory Visit (INDEPENDENT_AMBULATORY_CARE_PROVIDER_SITE_OTHER): Payer: BLUE CROSS/BLUE SHIELD | Admitting: Family Medicine

## 2015-07-13 VITALS — BP 100/60 | HR 69 | Temp 98.3°F | Ht 67.0 in | Wt 161.0 lb

## 2015-07-13 DIAGNOSIS — Z79899 Other long term (current) drug therapy: Secondary | ICD-10-CM | POA: Diagnosis not present

## 2015-07-13 DIAGNOSIS — I10 Essential (primary) hypertension: Secondary | ICD-10-CM | POA: Diagnosis not present

## 2015-07-13 DIAGNOSIS — R42 Dizziness and giddiness: Secondary | ICD-10-CM | POA: Diagnosis not present

## 2015-07-13 DIAGNOSIS — E785 Hyperlipidemia, unspecified: Secondary | ICD-10-CM

## 2015-07-13 LAB — LIPID PANEL
Cholesterol: 145 mg/dL (ref 125–200)
HDL: 67 mg/dL (ref >=46)
LDL Cholesterol: 68 mg/dL (ref <130)
Total CHOL/HDL Ratio: 2.2 Ratio (ref <=5.0)
Triglycerides: 49 mg/dL (ref <150)
VLDL: 10 mg/dL (ref <30)

## 2015-07-13 LAB — COMPLETE METABOLIC PANEL WITH GFR
ALT: 21 U/L (ref 6–29)
AST: 24 U/L (ref 10–35)
Albumin: 4 g/dL (ref 3.6–5.1)
Alkaline Phosphatase: 84 U/L (ref 33–130)
BUN: 20 mg/dL (ref 7–25)
CO2: 24 mmol/L (ref 20–31)
Calcium: 8.7 mg/dL (ref 8.6–10.4)
Chloride: 105 mmol/L (ref 98–110)
Creat: 1.17 mg/dL — ABNORMAL HIGH (ref 0.50–0.99)
GFR, Est African American: 57 mL/min — ABNORMAL LOW (ref >=60)
GFR, Est Non African American: 50 mL/min — ABNORMAL LOW (ref >=60)
Glucose, Bld: 89 mg/dL (ref 65–99)
Potassium: 4.1 mmol/L (ref 3.5–5.3)
Sodium: 140 mmol/L (ref 135–146)
Total Bilirubin: 0.5 mg/dL (ref 0.2–1.2)
Total Protein: 6.1 g/dL (ref 6.1–8.1)

## 2015-07-13 MED ORDER — MECLIZINE HCL 32 MG PO TABS: 32.0000 mg | ORAL_TABLET | Freq: Two times a day (BID) | ORAL | Status: DC | PRN

## 2015-07-13 NOTE — Assessment & Plan Note (Signed)
Improved/stable: Patient's blood pressure today is on the low side of normal. BP 100/60. Pulse of 69. Patient is currently asymptomatic. Antihypertensive medications include Ramipril 2.5 mg daily. Other blood pressure affecting medications include Lasix 20 mg daily. With patient's blood pressure as low as it is and her history of occasional falls secondary to dizziness/vertigo I would like to decrease some of her medications in order to keep her systolic blood pressure above 110. - I've asked patient to decrease her Lasix dose to 10 mg daily. - We will monitor her blood pressure.

## 2015-07-13 NOTE — Assessment & Plan Note (Signed)
Improved/stable: Patient endorses improved and relatively stable dizziness/vertigo. She has had great benefit from her neurological rehabilitation program. My concern is that patient will be getting on a long flight for the first time since experiencing her stroke and her symptoms of dizziness/vertigo. - I've written for a  short prescription of meclizine to help for any dizziness caused by the flight. I have asked patient to use this medication prior to the trip to assess her tolerance and response. I have also asked patient to contact our office with a short report of how she tolerated and responded to this medication. Patient stated her understanding.

## 2015-07-13 NOTE — Progress Notes (Signed)
HPI  CC: Blood pressure follow-up and travel recommendations Patient is here for a follow-up on her blood pressure. She states she has been doing well and taking off her medications as prescribed. She denies any significant symptoms of hypotension, or orthostatic signs. She states that for the majority of her days she feels well and is able to function at a high level. She endorses lightheadedness and occasional vertigo however this is been insistent since her stroke and is not believed to be secondary to her blood pressures. She denies any vision changes, shortness of breath, chest pain, headache, nausea, vomiting, new weakness, new numbness, or new paresthesias.  Patient states that she is planning to travel to [place] with her family later on this month. She is asking whether or not I felt this was a safe idea with her medical history. She states that she feels very comfortable going but her family insisted on getting her primary care physician's "blessing" before she is to head out. This vacation is described as "relaxing" and seemed to involve little physical activity. She denies any anxiety towards the flying process however she states that she has not flown since she had her stroke.  Patient and I also briefly discussed her medication regimen including her statin medication. She states she takes this regularly and has had no issues. Tolerating well.  Review of Systems   See HPI for ROS. All other systems reviewed and are negative.  CC, SH/smoking status, and VS noted  Objective: BP 100/60 mmHg  Pulse 69  Temp(Src) 98.3 F (36.8 C) (Oral)  Ht 5\' 7"  (1.702 m)  Wt 161 lb (73.029 kg)  BMI 25.21 kg/m2 Gen: NAD, alert, cooperative, and pleasant. HEENT: NCAT, EOMI, PERRL CV: RRR, no murmur Resp: CTAB, no wheezes, non-labored Ext: No edema, warm, strength 5/5 throughout Neuro: Alert and oriented, Speech clear, continued decreased sensation over the right side of her face (at patient's  baseline), Romberg negative, gait normal, finger to nose normal   Assessment and plan:  HTN (hypertension) Improved/stable: Patient's blood pressure today is on the low side of normal. BP 100/60. Pulse of 69. Patient is currently asymptomatic. Antihypertensive medications include Ramipril 2.5 mg daily. Other blood pressure affecting medications include Lasix 20 mg daily. With patient's blood pressure as low as it is and her history of occasional falls secondary to dizziness/vertigo I would like to decrease some of her medications in order to keep her systolic blood pressure above 110. - I've asked patient to decrease her Lasix dose to 10 mg daily. - We will monitor her blood pressure.  Dizziness Improved/stable: Patient endorses improved and relatively stable dizziness/vertigo. She has had great benefit from her neurological rehabilitation program. My concern is that patient will be getting on a long flight for the first time since experiencing her stroke and her symptoms of dizziness/vertigo. - I've written for a  short prescription of meclizine to help for any dizziness caused by the flight. I have asked patient to use this medication prior to the trip to assess her tolerance and response. I have also asked patient to contact our office with a short report of how she tolerated and responded to this medication. Patient stated her understanding.  HLD (hyperlipidemia) Stable: Patient has been taking atorvastatin 40 mg for hyperlipidemia. She endorses good tolerance of this medication. No follow-up lipid panel has been obtained since initiating this medication. - We'll obtain lipid panel and CMP to ensure patient's tolerance.    Orders Placed This Encounter  Procedures  . Lipid panel  . COMPLETE METABOLIC PANEL WITH GFR    Meds ordered this encounter  Medications  . meclizine (ANTIVERT) 32 MG tablet    Sig: Take 1 tablet (32 mg total) by mouth 2 (two) times daily as needed for  dizziness.    Dispense:  20 tablet    Refill:  0     Elberta Leatherwood, MD,MS,  PGY2 07/13/2015 11:34 AM

## 2015-07-13 NOTE — Assessment & Plan Note (Signed)
Stable: Patient has been taking atorvastatin 40 mg for hyperlipidemia. She endorses good tolerance of this medication. No follow-up lipid panel has been obtained since initiating this medication. - We'll obtain lipid panel and CMP to ensure patient's tolerance.

## 2015-07-13 NOTE — Patient Instructions (Signed)
It was a pleasure seeing you today in our clinic. Today we discussed your blood pressure and vertigo. Here is the treatment plan we have discussed and agreed upon together:   - Your blood pressure today appears to be a little low. As long as you're not having any symptoms of lightheadedness or dizziness than this is an appropriate blood pressure. - I think that it is safe for you to be flying with your current medical history. I have provided you a prescription for meclizine. Take this if you have any motion sickness or dizziness from the flights. I would recommend taking this in the days leading up to your trip if you experience any dizziness to see how you tolerate it. Please let me know how you respond when taking this medication.

## 2015-07-16 ENCOUNTER — Other Ambulatory Visit: Payer: Self-pay | Admitting: Family Medicine

## 2015-07-16 ENCOUNTER — Telehealth: Payer: Self-pay | Admitting: Family Medicine

## 2015-07-16 ENCOUNTER — Encounter: Payer: Self-pay | Admitting: Family Medicine

## 2015-07-16 MED ORDER — MECLIZINE HCL 25 MG PO TABS: 25.0000 mg | ORAL_TABLET | Freq: Two times a day (BID) | ORAL | Status: DC | PRN

## 2015-07-16 NOTE — Telephone Encounter (Signed)
Prescription sent. Sorry about that.

## 2015-07-16 NOTE — Telephone Encounter (Signed)
Patient's rx for meclizine was never received by pharmacy thru electronic process.  Chart shows rx received on 07/13/15 @ 10:42 AM by pharmacy, but called them and was told they did not have.  [facility] [address].

## 2015-07-16 NOTE — Telephone Encounter (Signed)
Received fax from pharmacy. MD ordered meclizine 32mg  tablet. Medication only comes in 12.5mg , 25mg , and 50mg  tablets. Please re-order with one of these dosages.

## 2015-08-13 ENCOUNTER — Telehealth: Payer: Self-pay | Admitting: *Deleted

## 2015-08-13 NOTE — Telephone Encounter (Signed)
Yvette/Release Point [phone] called to confirm if MR was rec'd. It was 08/09/15.

## 2015-08-15 NOTE — Telephone Encounter (Signed)
sent medical records to  [facility] c/o RP

## 2015-08-16 NOTE — Telephone Encounter (Signed)
Completed. By Van Clines

## 2015-08-26 ENCOUNTER — Other Ambulatory Visit: Payer: Self-pay | Admitting: Family Medicine

## 2015-08-29 ENCOUNTER — Ambulatory Visit: Payer: BLUE CROSS/BLUE SHIELD | Admitting: Adult Health

## 2015-09-01 ENCOUNTER — Other Ambulatory Visit: Payer: Self-pay | Admitting: Family Medicine

## 2015-09-03 ENCOUNTER — Ambulatory Visit (INDEPENDENT_AMBULATORY_CARE_PROVIDER_SITE_OTHER): Payer: BLUE CROSS/BLUE SHIELD | Admitting: Adult Health

## 2015-09-03 ENCOUNTER — Encounter: Payer: Self-pay | Admitting: Adult Health

## 2015-09-03 VITALS — BP 106/66 | HR 67 | Resp 16 | Ht 67.0 in | Wt 164.0 lb

## 2015-09-03 DIAGNOSIS — G43019 Migraine without aura, intractable, without status migrainosus: Secondary | ICD-10-CM

## 2015-09-03 NOTE — Patient Instructions (Signed)
Scheduled for botox Continue current medication If your symptoms worsen or you develop new symptoms please let us know.

## 2015-09-03 NOTE — Progress Notes (Signed)
I have read the note, and I agree with the clinical assessment and plan.  [name],[name]   

## 2015-09-03 NOTE — Progress Notes (Signed)
PATIENT: [name] DOB: [birth year]  REASON FOR VISIT: follow up- migraine headaches HISTORY FROM: patient  HISTORY OF PRESENT ILLNESS: [name] is a 65 year old female with a history of intractable migraine headaches. She returns today for follow-up. At the last visit we discussed Botox and the patient was interested in trying this again. However she has not been set up for injections. She states that her headaches are relatively the same. She does have daily headaches. Her headaches are not always severe. She reports 5 severe headaches in the last month and a half. Her headaches are always located in the right temporal region or right neck and shoulder. With severe headache she does have photophobia and phonophobia. She states that the smell of food also makes her very nauseous when she has a headache. She does know that weather is a trigger for her headaches. She states that with high humidity her headaches are worse. She returns today for an evaluation.  update 05/28/15: [name] is a 65 year old female with a history of intractable migraine headaches. She returns today for follow-up. The patient is on multiple medications without any benefit. At the last visit Dr. Jannifer Franklin started her on riboflavin, magnesium and increase her gabapentin to 600 mg twice a day. She reports that she received no benefit. The patient has at least 15 headache days a month. She has a daily headache that she rates a 5 out of 10 on the pain scale. She states that she has at least one severe headache a week. She reports photophobia and nausea but no vomiting. She knows that the smell of food is a trigger for her headaches. 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 The patient has been approved for Botox in the past however her insurance expired so she was unable to receive injections.  She is currently on doxepin, gabapentin, tizanidine, Topamax,  riboflavin, magnesium, Zoloft and altace with no benefit. The patient is interested in using Botox again.  HISTORY 01/26/15 (WILLIS): [name] is a 65 year old right-handed white female with a history of intractable migraine headache. The patient is having daily headaches, the headaches may become quite severe at least once a week, she may have 4 to 5 incapacitating headaches a month. She reports some neck stiffness with headache. She has photophobia and phonophobia. Factors such as weather changes, and certain odors such as fish or perfumes may bring on headaches rapidly. In the past, IV Depacon may help alleviate a severe headache. The patient reports some neck stiffness with the headache. She will have nausea, no vomiting. The patient may have some blurring of vision, no loss of vision. 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 She was to be set up for Botox injections, but apparently this was denied through her insurance. She indicates that her insurance will be changing at the beginning of the next year, we may consider reapplying for Botox at that time. The patient currently is on doxepin, gabapentin, tizanidine, and Topamax without much benefit. The patient is also on Zoloft and Altace. She returns to this office for an evaluation.   REVIEW OF SYSTEMS: Out of a complete 14 system review of symptoms, the patient complains only of the following symptoms, and all other reviewed systems are negative.  Fatigue, light sensitivity, blurred vision, shortness of breath, chest pain, leg swelling, murmur, restless leg, insomnia, excessive thirst, cold intolerance, food allergies, aching muscles,  muscle cramps, confusion, depression, nervous/anxious, weakness, numbness, headache, dizziness, bruise/bleed easily  ALLERGIES: Allergies  Allergen Reactions  . Shellfish Allergy Anaphylaxis and Other (See Comments)    All seafood    HOME MEDICATIONS: Outpatient Prescriptions  Prior to Visit  Medication Sig Dispense Refill  . atorvastatin (LIPITOR) 40 MG tablet Take 1 tablet (40 mg total) by mouth daily. 90 tablet 3  . clopidogrel (PLAVIX) 75 MG tablet Take 1 tablet (75 mg total) by mouth daily. 30 tablet 11  . doxepin (SINEQUAN) 100 MG capsule Take 2 capsules (200 mg total) by mouth at bedtime. 180 capsule 1  . furosemide (LASIX) 20 MG tablet Take 10 mg by mouth daily.  6  . gabapentin (NEURONTIN) 600 MG tablet Take 1 tablet (600 mg total) by mouth 2 (two) times daily. 180 tablet 3  . lidocaine (LIDODERM) 5 % Place 2 patches onto the skin daily. Remove & Discard patch within 12 hours or as directed by MD 60 patch 11  . meclizine (ANTIVERT) 25 MG tablet Take 1 tablet (25 mg total) by mouth 2 (two) times daily as needed for dizziness. 20 tablet 0  . ramipril (ALTACE) 2.5 MG capsule Take 1 capsule (2.5 mg total) by mouth daily. 90 capsule 0  . sertraline (ZOLOFT) 25 MG tablet TAKE 3 TABLETS EVERY DAY 90 tablet 0  . tiZANidine (ZANAFLEX) 2 MG tablet Take 1 tablet (2 mg total) by mouth 3 (three) times daily. 270 tablet 1  . topiramate (TOPAMAX) 100 MG tablet TAKE 1 TABLET TWICE A DAY 180 tablet 0   No facility-administered medications prior to visit.    PAST MEDICAL HISTORY: Past Medical History  Diagnosis Date  . Hyperlipidemia   . Migraine headache   . GERD (gastroesophageal reflux disease)   . Occipital neuralgia     Left  . Chronic kidney disease     stage 3  . Stroke [facility]) 10/2013    PAST SURGICAL HISTORY: Past Surgical History  Procedure Laterality Date  . Abdominal hysterectomy    . Knee surgery    . Carpal tunnel release    . Elbow surgery    . Lung surgery    . Bladder repair    . Cervical spine surgery      FAMILY HISTORY: Family History  Problem Relation Age of Onset  . Cancer Mother 31    breast  . Alzheimer's disease Mother   . Cancer Brother   . Hyperlipidemia Brother   . Prostate cancer Father   . Lung cancer Father      SOCIAL HISTORY: Social History   Social History  . Marital Status: Married    Spouse Name: [name]  . Number of Children: 1  . Years of Education: 12+   Occupational History  . ACTIVITY DIRECTOR   . CNA    Social History Main Topics  . Smoking status: Former Smoker -- 0.30 packs/day    Types: Cigarettes    Quit date: 11/01/2011  . Smokeless tobacco: Never Used  . Alcohol Use: No  . Drug Use: No  . Sexual Activity:    Partners: Male    Birth Control/ Protection: Surgical     Comment: 1 sexual partner in last 12 months: married   Other Topics Concern  . Not on file   Social History Narrative   Patient is married ([name]).   Patient has one child.   Patient is working full-time.   Patient is right handed.   Patient has a college  education.   Patient does not drink caffeine.      PHYSICAL EXAM  Filed Vitals:   09/03/15 0745  BP: 106/66  Pulse: 67  Resp: 16  Height: 5\' 7"  (1.702 m)  Weight: 164 lb (74.39 kg)   Body mass index is 25.68 kg/(m^2).  Generalized: Well developed, in no acute distress   Neurological examination  Mentation: Alert oriented to time, place, history taking. Follows all commands speech and language fluent Cranial nerve II-XII: Pupils were equal round reactive to light. Extraocular movements were full, visual field were full on confrontational test. Facial sensationDecreased on the right per patient. Vibration sensation splits midline. strength was normal. Uvula tongue midline. Head turning and shoulder shrug  were normal and symmetric. Motor: The motor testing reveals 5 over 5 strength of all 4 extremities. Good symmetric motor tone is noted throughout.  Sensory: Sensory testing is intact to soft touch on all 4 extremities but reports decreased sensation on the right. No evidence of extinction is noted.  Coordination: Cerebellar testing reveals good finger-nose-finger and heel-to-shin bilaterally.  Gait and station: Gait is normal. Tandem gait  is normal. Romberg is negative. No drift is seen.  Reflexes: Deep tendon reflexes are symmetric and normal bilaterally.   DIAGNOSTIC DATA (LABS, IMAGING, TESTING) - I reviewed patient records, labs, notes, testing and imaging myself where available.      Component Value Date/Time   NA 140 07/13/2015 1056   K 4.1 07/13/2015 1056   CL 105 07/13/2015 1056   CO2 24 07/13/2015 1056   GLUCOSE 89 07/13/2015 1056   BUN 20 07/13/2015 1056   CREATININE 1.17* 07/13/2015 1056   CREATININE 1.22* 10/12/2013 0512   CALCIUM 8.7 07/13/2015 1056   PROT 6.1 07/13/2015 1056   ALBUMIN 4.0 07/13/2015 1056   AST 24 07/13/2015 1056   ALT 21 07/13/2015 1056   ALKPHOS 84 07/13/2015 1056   BILITOT 0.5 07/13/2015 1056   GFRNONAA 50* 07/13/2015 1056   GFRNONAA 47* 10/12/2013 0512   GFRAA 57* 07/13/2015 1056   GFRAA 54* 10/12/2013 0512   Lab Results  Component Value Date   CHOL 145 07/13/2015   HDL 67 07/13/2015   LDLCALC 68 07/13/2015   TRIG 49 07/13/2015   CHOLHDL 2.2 07/13/2015         ASSESSMENT AND PLAN 65 y.o. year old female  has a past medical history of Hyperlipidemia; Migraine headache; GERD (gastroesophageal reflux disease); Occipital neuralgia; Chronic kidney disease; and Stroke ([place]) (10/2013). here with:  1. Chronic migraines 2. Non-organic exam  The patient continues to have daily headaches. She is currently on gabapentin, Zanaflex and Topamax. She will continue these medications. We will get her scheduled for Botox injections before she leaves the office today. On exam she does split midline with vibration sensation. Patient advised that if her symptoms worsen or she develops new symptoms she should let us know. She will follow-up in 6 months with Dr. Jannifer Franklin.    Ward Givens, MSN, NP-C 09/03/2015, 7:54 AM [facility] [address] [place], [place] [address] [phone]

## 2015-09-12 ENCOUNTER — Telehealth: Payer: Self-pay | Admitting: Adult Health

## 2015-09-12 ENCOUNTER — Telehealth: Payer: Self-pay | Admitting: *Deleted

## 2015-09-12 MED ORDER — TIZANIDINE HCL 2 MG PO TABS: 2.0000 mg | ORAL_TABLET | Freq: Three times a day (TID) | ORAL | Status: DC

## 2015-09-12 NOTE — Telephone Encounter (Signed)
[facility]im

## 2015-09-12 NOTE — Telephone Encounter (Signed)
Patient requesting refill of tiZANidine (ZANAFLEX) 2 MG tablet [id number] [place], [place] - [place]

## 2015-09-12 NOTE — Telephone Encounter (Signed)
Reference# [id number].  Called to check on status of form that was faxed regarding patient's restrictions and capabilities.  Caryl Asp is calling to receive medical records on behalf of the patient for a disability claim.  Jazmin Hartsell,CMA

## 2015-09-12 NOTE — Telephone Encounter (Signed)
They would prefer to be contacted by the end of the day to know that status of this form. Earleen Aoun,CMA

## 2015-09-12 NOTE — Telephone Encounter (Signed)
Patient needs to come in for an appt to have this form filled out. There is absolutely no way I can fill it out accurately w/o her present.

## 2015-09-13 ENCOUNTER — Ambulatory Visit: Payer: BLUE CROSS/BLUE SHIELD

## 2015-09-13 ENCOUNTER — Other Ambulatory Visit: Payer: Self-pay | Admitting: Family Medicine

## 2015-09-13 NOTE — Telephone Encounter (Signed)
Patient has an appt on 09-17-15 for form completion.  Agency is aware. Mckyla Deckman,CMA

## 2015-09-13 NOTE — Telephone Encounter (Signed)
LM for patient to call back.  She will need to make an appt with Dr. Alease Frame to discuss work restrictions. Almeter Westhoff,CMA

## 2015-09-17 ENCOUNTER — Other Ambulatory Visit: Payer: Self-pay | Admitting: Physical Medicine & Rehabilitation

## 2015-09-17 ENCOUNTER — Ambulatory Visit (INDEPENDENT_AMBULATORY_CARE_PROVIDER_SITE_OTHER): Payer: BLUE CROSS/BLUE SHIELD | Admitting: Family Medicine

## 2015-09-17 VITALS — BP 154/86 | HR 75 | Temp 98.8°F | Wt 163.9 lb

## 2015-09-17 DIAGNOSIS — G43711 Chronic migraine without aura, intractable, with status migrainosus: Secondary | ICD-10-CM | POA: Diagnosis not present

## 2015-09-17 MED ORDER — SERTRALINE HCL 50 MG PO TABS: 50.0000 mg | ORAL_TABLET | Freq: Two times a day (BID) | ORAL | Status: DC

## 2015-09-17 NOTE — Progress Notes (Signed)
   HPI  CC: Medication refill and forms Patients here for medication refill and forms. She states she has been doing well and has no new complaints at this time. I've asked that she follow-up with me to fill out disability forms as having her present for many of the questions would be helpful.  She endorses good compliance with her medications at this time. She states that the meclizine that I prescribed at our last visit had helped substantially.   ROS: Patient denies any changes in her baseline from her headache, dizziness, lightheadedness, and imbalance. She denies any weakness, numbness, chest pain, shortness of breath, fever, chills, nausea, vomiting, diarrhea.  CC, SH/smoking status, and VS noted  Objective: BP 154/86 mmHg  Pulse 75  Temp(Src) 98.8 F (37.1 C) (Oral)  Wt 163 lb 14.4 oz (74.345 kg)  SpO2 98% Gen: NAD, alert, cooperative, and pleasant. CV: RRR, no murmur Resp: CTAB, no wheezes, non-labored Ext: No edema, warm, strength 5/5 throughout Neuro: Alert and oriented, Speech clear  Assessment and plan:  Chronic migraine without aura, with status migrainosus Stable: Currently unchanged. Patient experienced some benefit with the meclizine regarding some of her dizziness. - Refilled medications - Increasing Zoloft from 75 mg daily to 100 mg daily - Filled out paperwork for disability.   Meds ordered this encounter  Medications  . sertraline (ZOLOFT) 50 MG tablet    Sig: Take 1 tablet (50 mg total) by mouth 2 (two) times daily.    Dispense:  60 tablet    Refill:  2     Elberta Leatherwood, MD,MS,  PGY3 09/17/2015 6:01 PM

## 2015-09-17 NOTE — Patient Instructions (Signed)
It was a pleasure seeing you today in our clinic. Today we discussed your medications. Here is the treatment plan we have discussed and agreed upon together:   - I've increased her dose of Zoloft. Begin taking 50 mg in the morning and 50 mg at night. - Please discuss with your neurologist whether or not he believes it is okay for you to be taking the doxepin while on Zoloft.

## 2015-09-17 NOTE — Assessment & Plan Note (Addendum)
Stable: Currently unchanged. Patient experienced some benefit with the meclizine regarding some of her dizziness. - Refilled medications - Increasing Zoloft from 75 mg daily to 100 mg daily - Filled out paperwork for disability.

## 2015-09-18 ENCOUNTER — Ambulatory Visit: Payer: BLUE CROSS/BLUE SHIELD | Attending: Physical Medicine & Rehabilitation

## 2015-09-18 DIAGNOSIS — M25562 Pain in left knee: Secondary | ICD-10-CM | POA: Diagnosis present

## 2015-09-18 DIAGNOSIS — M79604 Pain in right leg: Secondary | ICD-10-CM | POA: Diagnosis present

## 2015-09-18 DIAGNOSIS — M542 Cervicalgia: Secondary | ICD-10-CM | POA: Insufficient documentation

## 2015-09-18 DIAGNOSIS — M25561 Pain in right knee: Secondary | ICD-10-CM | POA: Diagnosis present

## 2015-09-18 DIAGNOSIS — M5441 Lumbago with sciatica, right side: Secondary | ICD-10-CM | POA: Diagnosis present

## 2015-09-18 NOTE — Telephone Encounter (Signed)
Entered in error

## 2015-09-18 NOTE — Therapy (Signed)
[place], [place], [address] Phone: [phone]   Fax:  [phone]  Physical Therapy Evaluation/Discharge  Patient Details  Name: [name] MRN: [id number] Date of Birth: Jan 12, 1951 Referring Provider: Dr. Georges Lynch  Encounter Date: 09/18/2015      PT End of Session - 09/18/15 1130    Visit Number 1   Number of Visits 1   Authorization Type BCBS   PT Start Time 0755   PT Stop Time 1105   PT Time Calculation (min) 190 min   Activity Tolerance Patient limited by pain   Behavior During Therapy [facility] for tasks assessed/performed      Past Medical History  Diagnosis Date  . Hyperlipidemia   . Migraine headache   . GERD (gastroesophageal reflux disease)   . Occipital neuralgia     Left  . Chronic kidney disease     stage 3  . Stroke [facility]) 10/2013    Past Surgical History  Procedure Laterality Date  . Abdominal hysterectomy    . Knee surgery    . Carpal tunnel release    . Elbow surgery    . Lung surgery    . Bladder repair    . Cervical spine surgery      There were no vitals filed for this visit.       Subjective Assessment - 09/18/15 1130    Subjective See FCE report scanned into EPIC        FCE COMPLETED WITH WORK LEVEL RATING OF SEDENTARY.  SEE REPORT SCANNED INTO EPIC                                   Plan - 09/18/15 1131    Clinical Impression Statement Ms Reil completed the FCE process with work rating of Sedentary. See report in EPIC   Consulted and Agree with Plan of Care Patient      Patient will benefit from skilled therapeutic intervention in order to improve the following deficits and impairments:     Visit Diagnosis: Pain In Right Leg - Plan: PT plan of care cert/re-cert  Bilateral low back pain with right-sided sciatica - Plan: PT plan of care cert/re-cert  Cervicalgia - Plan: PT plan of care cert/re-cert  Pain in  left knee - Plan: PT plan of care cert/re-cert  Pain in right knee - Plan: PT plan of care cert/re-cert     Problem List Patient Active Problem List   Diagnosis Date Noted  . HLD (hyperlipidemia) 04/02/2015  . Chronic migraine without aura, with status migrainosus 09/26/2014  . Dizziness 11/09/2013  . Adjustment disorder with mixed anxiety and depressed mood 11/03/2013  . Migraine variant 10/10/2013  . Disturbance of skin sensation 10/10/2013  . TIA (transient ischemic attack) 10/10/2013  . HTN (hypertension) 07/18/2013  . 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 Neuralgia, neuritis, and radiculitis, unspecified 02/04/2012  . 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 Myofascial muscle pain 07/15/2011    Darrel Hoover PT 09/18/2015, 11:37 AM  [facility] [address] [place], [place], [address] Phone: [phone]   Fax:  [phone]  Name: [name] MRN: [id number] Date of Birth: 1950/05/09

## 2015-09-19 ENCOUNTER — Telehealth: Payer: Self-pay | Admitting: Neurology

## 2015-09-19 NOTE — Telephone Encounter (Signed)
Pt returned call and states the 11:00 appt for botox will not work. Can she have a different time? Please call and advise

## 2015-09-19 NOTE — Telephone Encounter (Signed)
Appt r/s by Larene Beach for Friday @ 9.

## 2015-09-20 ENCOUNTER — Telehealth: Payer: Self-pay | Admitting: *Deleted

## 2015-09-20 NOTE — Telephone Encounter (Signed)
I thought in the fax shelf. I guess its possible I placed it in the scanning box but I was positive (until now) that it was in the fax shelf

## 2015-09-20 NOTE — Telephone Encounter (Signed)
I looked in the "already faxed folder" for both Monday and Tuesday.  Do you remember where you placed this form?  Jazmin Hartsell,CMA

## 2015-09-20 NOTE — Telephone Encounter (Signed)
Forms were completed Monday during patient's OV and were placed in the fax box Monday after clinic. These should have been faxed out Tuesday.

## 2015-09-20 NOTE — Telephone Encounter (Signed)
The original apt was not on my schedule and when the new apt was made schedule had already been printed so the patients medication will not be here in time. Will need to reschedule apt. I have called her x2 and left a VM. If she calls back please relay this information to her. Thanks!

## 2015-09-20 NOTE — Telephone Encounter (Signed)
Michelle checking on status of forms for patient.  She would like these to be faxed back to their office directly so they can get it into patient's file. Will forward to Md to advise on form status. [phone]. Shauntavia Brackin,CMA

## 2015-09-21 ENCOUNTER — Ambulatory Visit: Payer: Self-pay | Admitting: Neurology

## 2015-09-25 NOTE — Telephone Encounter (Signed)
Will wait to hear back from agency if faxed wasn't received last week. [name],CMA

## 2015-09-30 ENCOUNTER — Other Ambulatory Visit: Payer: Self-pay | Admitting: Family Medicine

## 2015-10-16 NOTE — Telephone Encounter (Signed)
Patient called regarding rescheduling appointment for BOTOX injections. Please call to schedule [phone].

## 2015-10-25 NOTE — Telephone Encounter (Signed)
Pt called to schedule botox. Please call [phone]

## 2015-10-30 NOTE — Telephone Encounter (Signed)
[name] would you mind scheduling this patient?

## 2015-10-30 NOTE — Telephone Encounter (Addendum)
Called [facility] and they have been trying to reach out to patient with no success . I called and spoke to and gave her [facility] 's telephone number [phone] . Patient relayed she would call [facility] as soon as we got off phone . I will call [facility] and schedule shipment and get patient another Botox apt with Dr. Jannifer Franklin. Patient understood details.    Spoke to patient Botox will arrive 10/31/2015 and she has apt with Dr. Jannifer Franklin 11/01/2015 for Botox apt. Dr. Jannifer Franklin gave ok on 7:30 slot and patient is aware of her apt time . Thanks Hinton Dyer

## 2015-11-01 ENCOUNTER — Encounter: Payer: Self-pay | Admitting: Neurology

## 2015-11-01 ENCOUNTER — Ambulatory Visit (INDEPENDENT_AMBULATORY_CARE_PROVIDER_SITE_OTHER): Payer: BLUE CROSS/BLUE SHIELD | Admitting: Neurology

## 2015-11-01 VITALS — BP 121/82 | HR 72 | Ht 67.0 in | Wt 160.8 lb

## 2015-11-01 DIAGNOSIS — G43711 Chronic migraine without aura, intractable, with status migrainosus: Secondary | ICD-10-CM

## 2015-11-01 NOTE — Progress Notes (Signed)
**  Botox 100 units x 2 vials, Lot [id number], Exp 06/2018, [place] [id number], specialty pharmacy. Mck,rn**

## 2015-11-01 NOTE — Procedures (Signed)
     BOTOX PROCEDURE NOTE FOR MIGRAINE HEADACHE   HISTORY: [name] is a 65 year old patient with a history of intractable migraine headache. The patient is having daily headaches at this time, she is having about 4 days a month with the headaches are incapacitating, and she is unable to function. The patient has significant nausea and vomiting, photophobia and phonophobia and sensitivity to odors. The patient has neck stiffness, particularly on the right side. She has not responded to multiple medications and she comes in for her first Botox injection today.   Description of procedure:  The patient was placed in a sitting position. The standard protocol was used for Botox as follows, with 5 units of Botox injected at each site:   -Procerus muscle, midline injection  -Corrugator muscle, bilateral injection  -Frontalis muscle, bilateral injection, with 2 sites each side, medial injection was performed in the upper one third of the frontalis muscle, in the region vertical from the medial inferior edge of the superior orbital rim. The lateral injection was again in the upper one third of the forehead vertically above the lateral limbus of the cornea, 1.5 cm lateral to the medial injection site.  -Temporalis muscle injection, 4 sites, bilaterally. The first injection was 3 cm above the tragus of the ear, second injection site was 1.5 cm to 3 cm up from the first injection site in line with the tragus of the ear. The third injection site was 1.5-3 cm forward between the first 2 injection sites. The fourth injection site was 1.5 cm posterior to the second injection site.  -Occipitalis muscle injection, 3 sites, bilaterally. The first injection was done one half way between the occipital protuberance and the tip of the mastoid process behind the ear. The second injection site was done lateral and superior to the first, 1 fingerbreadth from the first injection. The third injection site was 1  fingerbreadth superiorly and medially from the first injection site.  -Cervical paraspinal muscle injection, 2 sites, bilateral, the first injection site was 1 cm from the midline of the cervical spine, 3 cm inferior to the lower border of the occipital protuberance. The second injection site was 1.5 cm superiorly and laterally to the first injection site.  -Trapezius muscle injection was performed at 3 sites, bilaterally. The first injection site was in the upper trapezius muscle halfway between the inflection point of the neck, and the acromion. The second injection site was one half way between the acromion and the first injection site. The third injection was done between the first injection site and the inflection point of the neck.   A 200 unit bottle of Botox was used, 155 units were injected, the rest of the Botox was wasted. The patient tolerated the procedure well, there were no complications of the above procedure.  Botox NDC [id number] Lot number [id number] Expiration date April 2020

## 2015-11-28 ENCOUNTER — Telehealth: Payer: Self-pay | Admitting: Physical Medicine & Rehabilitation

## 2015-11-28 MED ORDER — LIDOCAINE 5 % EX PTCH: 2.0000 | MEDICATED_PATCH | CUTANEOUS | 5 refills | Status: DC

## 2015-11-28 NOTE — Telephone Encounter (Signed)
Patient is requesting a refill on her pain patches.  Please call patient.

## 2015-11-28 NOTE — Telephone Encounter (Signed)
Done, patient notified

## 2015-12-04 ENCOUNTER — Emergency Department (HOSPITAL_COMMUNITY): Payer: BLUE CROSS/BLUE SHIELD

## 2015-12-04 ENCOUNTER — Encounter (HOSPITAL_COMMUNITY): Payer: Self-pay

## 2015-12-04 ENCOUNTER — Inpatient Hospital Stay (HOSPITAL_COMMUNITY)
Admission: EM | Admit: 2015-12-04 | Discharge: 2015-12-08 | DRG: 419 | Disposition: A | Discharge status: Home / Self Care | Payer: BLUE CROSS/BLUE SHIELD | Attending: General Surgery | Admitting: General Surgery

## 2015-12-04 DIAGNOSIS — Z8673 Personal history of transient ischemic attack (TIA), and cerebral infarction without residual deficits: Secondary | ICD-10-CM

## 2015-12-04 DIAGNOSIS — E876 Hypokalemia: Secondary | ICD-10-CM | POA: Diagnosis present

## 2015-12-04 DIAGNOSIS — K219 Gastro-esophageal reflux disease without esophagitis: Secondary | ICD-10-CM | POA: Diagnosis present

## 2015-12-04 DIAGNOSIS — K8000 Calculus of gallbladder with acute cholecystitis without obstruction: Principal | ICD-10-CM | POA: Diagnosis present

## 2015-12-04 DIAGNOSIS — G43909 Migraine, unspecified, not intractable, without status migrainosus: Secondary | ICD-10-CM | POA: Diagnosis present

## 2015-12-04 DIAGNOSIS — E785 Hyperlipidemia, unspecified: Secondary | ICD-10-CM | POA: Diagnosis present

## 2015-12-04 DIAGNOSIS — K8020 Calculus of gallbladder without cholecystitis without obstruction: Secondary | ICD-10-CM

## 2015-12-04 DIAGNOSIS — Z91013 Allergy to seafood: Secondary | ICD-10-CM

## 2015-12-04 DIAGNOSIS — M5481 Occipital neuralgia: Secondary | ICD-10-CM | POA: Diagnosis present

## 2015-12-04 DIAGNOSIS — R1011 Right upper quadrant pain: Secondary | ICD-10-CM

## 2015-12-04 DIAGNOSIS — Z7902 Long term (current) use of antithrombotics/antiplatelets: Secondary | ICD-10-CM

## 2015-12-04 DIAGNOSIS — Z87891 Personal history of nicotine dependence: Secondary | ICD-10-CM

## 2015-12-04 DIAGNOSIS — Z419 Encounter for procedure for purposes other than remedying health state, unspecified: Secondary | ICD-10-CM

## 2015-12-04 LAB — BASIC METABOLIC PANEL
Anion gap: 13 (ref 5–15)
BUN: 20 mg/dL (ref 6–20)
CO2: 21 mmol/L — ABNORMAL LOW (ref 22–32)
Calcium: 9.3 mg/dL (ref 8.9–10.3)
Chloride: 104 mmol/L (ref 101–111)
Creatinine, Ser: 1.14 mg/dL — ABNORMAL HIGH (ref 0.44–1.00)
GFR calc Af Amer: 58 mL/min — ABNORMAL LOW (ref >60)
GFR calc non Af Amer: 50 mL/min — ABNORMAL LOW (ref >60)
Glucose, Bld: 123 mg/dL — ABNORMAL HIGH (ref 65–99)
Potassium: 3.1 mmol/L — ABNORMAL LOW (ref 3.5–5.1)
Sodium: 138 mmol/L (ref 135–145)

## 2015-12-04 LAB — LIPASE, BLOOD: Lipase: 27 U/L (ref 11–51)

## 2015-12-04 LAB — HEPATIC FUNCTION PANEL
ALT: 26 U/L (ref 14–54)
AST: 27 U/L (ref 15–41)
Albumin: 4.4 g/dL (ref 3.5–5.0)
Alkaline Phosphatase: 87 U/L (ref 38–126)
Bilirubin, Direct: <0.1 mg/dL — ABNORMAL LOW (ref 0.1–0.5)
Total Bilirubin: 0.7 mg/dL (ref 0.3–1.2)
Total Protein: 7.1 g/dL (ref 6.5–8.1)

## 2015-12-04 LAB — CBC
HCT: 43.3 % (ref 36.0–46.0)
Hemoglobin: 14.6 g/dL (ref 12.0–15.0)
MCH: 29.4 pg (ref 26.0–34.0)
MCHC: 33.7 g/dL (ref 30.0–36.0)
MCV: 87.3 fL (ref 78.0–100.0)
Platelets: 238 10*3/uL (ref 150–400)
RBC: 4.96 MIL/uL (ref 3.87–5.11)
RDW: 13.7 % (ref 11.5–15.5)
WBC: 11.8 10*3/uL — ABNORMAL HIGH (ref 4.0–10.5)

## 2015-12-04 LAB — I-STAT TROPONIN, ED: Troponin i, poc: 0 ng/mL (ref 0.00–0.08)

## 2015-12-04 MED ORDER — ONDANSETRON HCL 4 MG/2ML IJ SOLN
4.0000 mg | Freq: Once | INTRAMUSCULAR | Status: AC
  Administered 2015-12-04: 4 mg via INTRAVENOUS
  Filled 2015-12-04: qty 2

## 2015-12-04 MED ORDER — HYDROMORPHONE HCL 1 MG/ML IJ SOLN
1.0000 mg | Freq: Once | INTRAMUSCULAR | Status: AC
  Administered 2015-12-04: 1 mg via INTRAVENOUS
  Filled 2015-12-04: qty 1

## 2015-12-04 MED ORDER — ONDANSETRON 4 MG PO TBDP
4.0000 mg | ORAL_TABLET | Freq: Once | ORAL | Status: AC | PRN
  Administered 2015-12-04: 4 mg via ORAL

## 2015-12-04 MED ORDER — ONDANSETRON 4 MG PO TBDP
ORAL_TABLET | ORAL | Status: AC
  Filled 2015-12-04: qty 1

## 2015-12-04 MED ORDER — SODIUM CHLORIDE 0.9 % IV BOLUS (SEPSIS)
1000.0000 mL | Freq: Once | INTRAVENOUS | Status: AC
  Administered 2015-12-04: 1000 mL via INTRAVENOUS

## 2015-12-04 NOTE — ED Notes (Signed)
Contacted lab at this time regarding pt's add on lab tests.

## 2015-12-04 NOTE — Consult Note (Signed)
Surgical Consultation Requesting provider: Voncille Lo MD  CC: abdominal pain, nausea, emesis  HPI: 65yo woman who presents with a constellation of symptoms that began around 1pm beginning with nausea, nonbilious nonbloody emesis, followed by sharp stabbing chest pain a couple hours later which was sternal and radiated to the right side. This was not precipitated by any meal and has never happened to her before. She has not eaten since yesterday. On presentation to the ER she was described as diaphoretic, and EKG/CXR were performed which were unremarkable. She then began to complain of right sided abdominal pain, first in the lower then the upper quadrant. US reveals cholelithiasis without cholecystitis. While she does not endorse a CAD hx she has hx of stroke for which she is on plavix- states last took this Monday morning as she takes it every other day. She now describes the pain as right midabdominal and radiating up through the midline to her epigastrium and back.  Allergies  Allergen Reactions  . Shellfish Allergy Anaphylaxis and Other (See Comments)    All seafood    Past Medical History:  Diagnosis Date  . Chronic kidney disease    stage 3  . GERD (gastroesophageal reflux disease)   . Hyperlipidemia   . Migraine headache   . Occipital neuralgia    Left  . Stroke [facility]) 10/2013    Past Surgical History:  Procedure Laterality Date  . ABDOMINAL HYSTERECTOMY    . BLADDER REPAIR    . CARPAL TUNNEL RELEASE    . CERVICAL SPINE SURGERY    . ELBOW SURGERY    . KNEE SURGERY    . LUNG SURGERY      Family History  Problem Relation Age of Onset  . Cancer Mother 35    breast  . Alzheimer's disease Mother   . Cancer Brother   . Hyperlipidemia Brother   . Prostate cancer Father   . Lung cancer Father     Social History   Social History  . Marital status: Married    Spouse name: [name]  . Number of children: 1  . Years of education: 12+   Occupational History  . [employer]  . CNA    Social History Main Topics  . Smoking status: Former Smoker    Packs/day: 0.30    Types: Cigarettes    Quit date: 11/01/2011  . Smokeless tobacco: Never Used  . Alcohol use No  . Drug use: No  . Sexual activity: Yes    Partners: Male    Birth control/ protection: Surgical     Comment: 1 sexual partner in last 12 months: married   Other Topics Concern  . None   Social History Narrative   Patient is married ([name]).   Patient has one child.   Patient is working full-time.   Patient is right handed.   Patient has a college education.   Patient does not drink caffeine.    No current facility-administered medications on file prior to encounter.    Current Outpatient Prescriptions on File Prior to Encounter  Medication Sig Dispense Refill  . atorvastatin (LIPITOR) 40 MG tablet Take 1 tablet (40 mg total) by mouth daily. 90 tablet 3  . clopidogrel (PLAVIX) 75 MG tablet TAKE 1 TABLET BY MOUTH EVERY DAY 30 tablet 5  . doxepin (SINEQUAN) 100 MG capsule Take 2 capsules (200 mg total) by mouth at bedtime. 180 capsule 1  . furosemide (LASIX) 20 MG tablet Take 10 mg  by mouth daily.  6  . gabapentin (NEURONTIN) 600 MG tablet Take 1 tablet (600 mg total) by mouth 2 (two) times daily. 180 tablet 3  . lidocaine (LIDODERM) 5 % Place 2 patches onto the skin daily. Remove & Discard patch within 12 hours or as directed by MD 60 patch 5  . meclizine (ANTIVERT) 25 MG tablet Take 1 tablet (25 mg total) by mouth 2 (two) times daily as needed for dizziness. 20 tablet 0  . ramipril (ALTACE) 2.5 MG capsule TAKE ONE CAPSULE BY MOUTH EVERY DAY 90 capsule 3  . sertraline (ZOLOFT) 50 MG tablet Take 1 tablet (50 mg total) by mouth 2 (two) times daily. 60 tablet 2  . tiZANidine (ZANAFLEX) 2 MG tablet Take 1 tablet (2 mg total) by mouth 3 (three) times daily. 270 tablet 1  . topiramate (TOPAMAX) 100 MG tablet TAKE 1 TABLET TWICE A DAY 180 tablet 0    Review of Systems: a  complete, 10pt review of systems was completed with pertinent positives and negatives as documented in the HPI.   Physical Exam: Vitals:   12/04/15 2315 12/04/15 2330  BP: 119/75 131/73  Pulse: 86 88  Resp: 14 16  Temp:     Gen: A&Ox3, no distress  H&N: normocephalic, atraumatic, EOMI, anicteric. Dry mucus membranes. Neck supple without mass or thyromegaly Chest: unlabored respirations  RRR with palpable distal pulses Abdomen: soft, obese, tender maximally at midpoint between ASIS and costal margin around midclavicular line. No peritoneal signs. Extremities: warm, without edema, no deformities  Neuro: grossly intact Psych: appropriate mood and affect   CBC Latest Ref Rng & Units 12/04/2015 12/25/2014 11/09/2013  WBC 4.0 - 10.5 K/uL 11.8(H) 6.1 8.4  Hemoglobin 12.0 - 15.0 g/dL 14.6 14.1 14.5  Hematocrit 36.0 - 46.0 % 43.3 42.3 42.8  Platelets 150 - 400 K/uL 238 285 250    CMP Latest Ref Rng & Units 12/04/2015 07/13/2015 12/25/2014  Glucose 65 - 99 mg/dL 123(H) 89 88  BUN 6 - 20 mg/dL 20 20 17   Creatinine 0.44 - 1.00 mg/dL 1.14(H) 1.17(H) 1.13(H)  Sodium 135 - 145 mmol/L 138 140 141  Potassium 3.5 - 5.1 mmol/L 3.1(L) 4.1 4.1  Chloride 101 - 111 mmol/L 104 105 106  CO2 22 - 32 mmol/L 21(L) 24 24  Calcium 8.9 - 10.3 mg/dL 9.3 8.7 9.2  Total Protein 6.5 - 8.1 g/dL 7.1 6.1 7.3  Total Bilirubin 0.3 - 1.2 mg/dL 0.7 0.5 0.4  Alkaline Phos 38 - 126 U/L 87 84 97  AST 15 - 41 U/L 27 24 20   ALT 14 - 54 U/L 26 21 17     Lab Results  Component Value Date   INR 1.03 10/10/2013   INR 1.0 01/07/2007    Imaging: CLINICAL DATA:  Acute onset of right upper quadrant abdominal pain. Initial encounter.  EXAM: US ABDOMEN LIMITED - RIGHT UPPER QUADRANT  COMPARISON:  CT of the abdomen and pelvis performed 06/21/2013  FINDINGS: Gallbladder: A 1.3 cm stone is noted about the base of the gallbladder, mobile but remaining near the neck. No gallbladder wall thickening or pericholecystic  fluid is seen. No ultrasonographic Murphy's sign is elicited. Common bile duct: Diameter: 0.7 cm, within normal limits in caliber for the patient's age. Liver: No focal lesion identified. Within normal limits in parenchymal echogenicity. IMPRESSION: 1.3 cm stone noted about the base of the gallbladder, mobile but remaining near the neck. No definite evidence for obstruction or cholecystitis.  Electronically Signed  By: Garald Balding M.D.   On: 12/04/2015 21:15  CLINICAL DATA:  Chest pain in shortness of breath today. EXAM: CHEST  2 VIEW COMPARISON:  10/10/2013 chest radiograph. 06/21/2013 CT abdomen and pelvis. FINDINGS: The cardiomediastinal silhouette is within normal limits. The lungs are well inflated and clear. There is no evidence of pleural effusion or pneumothorax. Abdominal aortic atherosclerosis is noted. There is a mild chronic L1 compression fracture. IMPRESSION: 1. No active cardiopulmonary disease. 2. Aortic atherosclerosis.  Electronically Signed   By: Logan Bores M.D.   On: 12/04/2015 16:25  A/P: Symptomatic cholelithiasis.  -Fluid resuscitation -Zosyn -NPO -Likely lap chole with possible cholangiogram tomorrow   Romana Juniper, MD [facility], [place] Pager [phone]

## 2015-12-04 NOTE — ED Provider Notes (Signed)
[facility] DEPT Provider Note   CSN: [id number] Arrival date & time: 12/04/15  1541     History   Chief Complaint Chief Complaint  Patient presents with  . Chest Pain    HPI [name] is a 65 y.o. female.  HPI  States nusea started first w/ emesis x 3 at 1PM. Emesis looked yellow w/o blood. Onset sudden in chest described as sharpe stabbing with breathing at 3:30PM. Sternal location with radiation into right side. No CAD or GB disease. No cough fever or chills. No previous blood clots in legs or history of PE. No history of asthma or COPD. Previous history of stroke. Patient takes plavix but no aspirin. Stroke caused right and lower weakness that has resolved. No abnormal sensation or weakness today. No abdominal pain or back pain or urinary symptoms. No recent trips, estrogen use, or hypercoagulable disease.  Past Medical History:  Diagnosis Date  . Chronic kidney disease    stage 3  . GERD (gastroesophageal reflux disease)   . Hyperlipidemia   . Migraine headache   . Occipital neuralgia    Left  . Stroke [facility]) 10/2013    Patient Active Problem List   Diagnosis Date Noted  . Symptomatic cholelithiasis 12/05/2015  . HLD (hyperlipidemia) 04/02/2015  . Chronic migraine without aura, with status migrainosus 09/26/2014  . Dizziness 11/09/2013  . Adjustment disorder with mixed anxiety and depressed mood 11/03/2013  . Migraine variant 10/10/2013  . Disturbance of skin sensation 10/10/2013  . TIA (transient ischemic attack) 10/10/2013  . HTN (hypertension) 07/18/2013  . 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 Neuralgia, neuritis, and radiculitis, unspecified 02/04/2012  . 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 Myofascial muscle pain 07/15/2011    Past Surgical History:  Procedure Laterality Date  . ABDOMINAL HYSTERECTOMY    . BLADDER REPAIR    . CARPAL TUNNEL RELEASE    . CERVICAL SPINE SURGERY    . CHOLECYSTECTOMY N/A 12/06/2015   Procedure: LAPAROSCOPIC  CHOLECYSTECTOMY WITH  INTRAOPERATIVE CHOLANGIOGRAM;  Surgeon: Stark Klein, MD;  Location: [facility];  Service: General;  Laterality: N/A;  . ELBOW SURGERY    . KNEE SURGERY    . LUNG SURGERY      OB History    No data available       Home Medications    Prior to Admission medications   Medication Sig Start Date End Date Taking? Authorizing Provider  atorvastatin (LIPITOR) 40 MG tablet Take 1 tablet (40 mg total) by mouth daily. 01/04/15  Yes Elberta Leatherwood, MD  clopidogrel (PLAVIX) 75 MG tablet TAKE 1 TABLET BY MOUTH EVERY DAY 09/04/15  Yes Elberta Leatherwood, MD  doxepin (SINEQUAN) 100 MG capsule Take 2 capsules (200 mg total) by mouth at bedtime. 10/15/14  Yes Kathrynn Ducking, MD  furosemide (LASIX) 20 MG tablet Take 10 mg by mouth daily. 05/09/14  Yes Historical Provider, MD  lidocaine (LIDODERM) 5 % Place 2 patches onto the skin daily. Remove & Discard patch within 12 hours or as directed by MD 11/28/15  Yes Charlett Blake, MD  meclizine (ANTIVERT) 25 MG tablet Take 1 tablet (25 mg total) by mouth 2 (two) times daily as needed for dizziness. 07/16/15  Yes Elberta Leatherwood, MD  ramipril (ALTACE) 2.5 MG capsule TAKE ONE CAPSULE BY MOUTH EVERY DAY 10/01/15  Yes Elberta Leatherwood, MD  sertraline (ZOLOFT) 50 MG tablet Take 1 tablet (50 mg total) by mouth 2 (two) times daily. 09/17/15  Yes Marylynn Pearson  McKeag, MD  tiZANidine (ZANAFLEX) 2 MG tablet Take 1 tablet (2 mg total) by mouth 3 (three) times daily. 09/12/15  Yes Ward Givens, NP  topiramate (TOPAMAX) 100 MG tablet TAKE 1 TABLET TWICE A DAY 05/07/15  Yes Kathrynn Ducking, MD  gabapentin (NEURONTIN) 600 MG tablet Take 1 tablet (600 mg total) by mouth 2 (two) times daily. 12/05/15   Charlett Blake, MD  ondansetron (ZOFRAN-ODT) 4 MG disintegrating tablet Take 1 tablet (4 mg total) by mouth every 6 (six) hours as needed for nausea. 12/08/15   Jerrye Beavers, PA-C  oxyCODONE (OXY IR/ROXICODONE) 5 MG immediate release tablet Take 1-2 tablets (5-10 mg total) by mouth  every 4 (four) hours as needed for moderate pain. 12/08/15   Jerrye Beavers, PA-C    Family History Family History  Problem Relation Age of Onset  . Cancer Mother 37    breast  . Alzheimer's disease Mother   . Cancer Brother   . Hyperlipidemia Brother   . Prostate cancer Father   . Lung cancer Father     Social History Social History  Substance Use Topics  . Smoking status: Former Smoker    Packs/day: 0.30    Types: Cigarettes    Quit date: 11/01/2011  . Smokeless tobacco: Never Used  . Alcohol use No     Allergies   Shellfish allergy   Review of Systems Review of Systems  Respiratory: Positive for shortness of breath (Pleuritic breathing). Negative for cough and chest tightness.   Cardiovascular: Negative for chest pain.  Gastrointestinal: Positive for nausea and vomiting.  All other systems reviewed and are negative.    Physical Exam Updated Vital Signs BP (!) 166/94 (BP Location: Right Arm)   Pulse 99   Temp 99.3 F (37.4 C) (Oral)   Resp 17   Ht 5\' 7"  (1.702 m)   Wt 75.5 kg   SpO2 97%   BMI 26.06 kg/m   Physical Exam  Constitutional: She is oriented to person, place, and time. She appears well-developed and well-nourished.  Non-toxic appearance. She does not appear ill. No distress.  HENT:  Head: Normocephalic and atraumatic.  Right Ear: External ear normal.  Left Ear: External ear normal.  Mouth/Throat: Oropharynx is clear and moist.  Eyes: EOM are normal. Pupils are equal, round, and reactive to light. No scleral icterus.  Neck: Normal range of motion. Neck supple. No tracheal deviation present.  Cardiovascular: Normal heart sounds and intact distal pulses.   No murmur heard. Pulmonary/Chest: Effort normal and breath sounds normal. No stridor. No respiratory distress. She has no wheezes. She has no rales.  Abdominal: Soft. Bowel sounds are normal. She exhibits no distension. There is tenderness (RUQ and epigastrium). There is no rebound and no  guarding.  Murphy's sign positive.  Musculoskeletal: Normal range of motion. She exhibits no deformity.  Neurological: She is alert and oriented to person, place, and time. She has normal strength and normal reflexes. No cranial nerve deficit or sensory deficit.  Skin: Skin is warm and dry. Capillary refill takes less than 2 seconds.  Psychiatric: She has a normal mood and affect. Her behavior is normal.  Nursing note and vitals reviewed.    ED Treatments / Results  Labs (all labs ordered are listed, but only abnormal results are displayed) Labs Reviewed  BASIC METABOLIC PANEL - Abnormal; Notable for the following:       Result Value   Potassium 3.1 (*)    CO2 21 (*)  Glucose, Bld 123 (*)    Creatinine, Ser 1.14 (*)    GFR calc non Af Amer 50 (*)    GFR calc Af Amer 58 (*)    All other components within normal limits  CBC - Abnormal; Notable for the following:    WBC 11.8 (*)    All other components within normal limits  HEPATIC FUNCTION PANEL - Abnormal; Notable for the following:    Bilirubin, Direct <0.1 (*)    All other components within normal limits  BASIC METABOLIC PANEL - Abnormal; Notable for the following:    Glucose, Bld 126 (*)    Calcium 8.2 (*)    All other components within normal limits  CBC - Abnormal; Notable for the following:    WBC 11.7 (*)    All other components within normal limits  CBC - Abnormal; Notable for the following:    Hemoglobin 11.6 (*)    All other components within normal limits  COMPREHENSIVE METABOLIC PANEL - Abnormal; Notable for the following:    Potassium 3.0 (*)    Chloride 112 (*)    Glucose, Bld 109 (*)    BUN 5 (*)    Creatinine, Ser 1.07 (*)    Calcium 7.7 (*)    Total Protein 5.3 (*)    Albumin 3.1 (*)    AST 99 (*)    ALT 100 (*)    GFR calc non Af Amer 54 (*)    Anion gap 4 (*)    All other components within normal limits  COMPREHENSIVE METABOLIC PANEL - Abnormal; Notable for the following:    Potassium 2.5 (*)     Glucose, Bld 115 (*)    BUN <5 (*)    Calcium 7.8 (*)    Total Protein 5.4 (*)    Albumin 2.7 (*)    AST 85 (*)    ALT 96 (*)    Anion gap 4 (*)    All other components within normal limits  CBC - Abnormal; Notable for the following:    RBC 3.83 (*)    Hemoglobin 11.1 (*)    HCT 34.1 (*)    All other components within normal limits  BASIC METABOLIC PANEL - Abnormal; Notable for the following:    Glucose, Bld 113 (*)    BUN <5 (*)    Calcium 7.8 (*)    All other components within normal limits  CBC - Abnormal; Notable for the following:    RBC 3.65 (*)    Hemoglobin 10.7 (*)    HCT 32.6 (*)    All other components within normal limits  SURGICAL PCR SCREEN  LIPASE, BLOOD  PROTIME-INR  APTT  I-STAT TROPOININ, ED  SURGICAL PATHOLOGY    EKG  EKG Interpretation  Date/Time:  Tuesday December 04 2015 15:46:00 EDT Ventricular Rate:  75 PR Interval:  154 QRS Duration: 76 QT Interval:  424 QTC Calculation: 473 R Axis:   56 Text Interpretation:  Normal sinus rhythm Nonspecific ST abnormality Abnormal ECG No significant change since last tracing Confirmed by Winfred Leeds  MD, [name] [phone]) on 12/04/2015 8:43:23 PM       Radiology Dg Chest 2 View  Result Date: 12/10/2015 CLINICAL DATA:  History of gallbladder surgery last Wednesday. Developed fever and cough on Sunday with right-sided abdominal pain radiating up into chest. EXAM: CHEST  2 VIEW COMPARISON:  Prior chest radiographs dating back through 02/07/2012 FINDINGS: Low lung volumes since previous studies. Trace fluid in the minor fissure  on the right with elevated appearance of the right hemidiaphragm which may in fact be due to a moderate-sized subpulmonic effusion. Slight blunting left lateral costophrenic angle as well presumably from a effusion. The heart and mediastinal contours are stable and within normal limits for size. Mild atherosclerosis of the aortic arch. The patient is status post ACDF of the lower cervical  spine. Chronic L1 mild compression. IMPRESSION: Elevated appearance of the right hemidiaphragm which may in fact be due to a moderate subpulmonic pleural effusion as there is also some fluid in the right minor fissure and left costophrenic angle on current exam. Decubitus views (right-side-down) of the chest may be provide more information as to whether this represents pleural fluid or CT as deemed clinically necessary. Electronically Signed   By: Ashley Royalty M.D.   On: 12/10/2015 19:45    Procedures Procedures (including critical care time)  Medications Ordered in ED Medications  ondansetron (ZOFRAN-ODT) 4 MG disintegrating tablet (  Canceled Entry 12/05/15 0700)  fentaNYL (SUBLIMAZE) 100 MCG/2ML injection (  Canceled Entry 12/06/15 1653)  potassium chloride 10 mEq in 100 mL IVPB (10 mEq Intravenous Given 12/07/15 2052)  ondansetron (ZOFRAN-ODT) disintegrating tablet 4 mg (4 mg Oral Given 12/04/15 1635)  HYDROmorphone (DILAUDID) injection 1 mg (1 mg Intravenous Given 12/04/15 1903)  ondansetron (ZOFRAN) injection 4 mg (4 mg Intravenous Given 12/04/15 1925)  sodium chloride 0.9 % bolus 1,000 mL (0 mLs Intravenous Stopped 12/04/15 2041)  HYDROmorphone (DILAUDID) injection 1 mg (1 mg Intravenous Given 12/04/15 2049)  HYDROmorphone (DILAUDID) injection 1 mg (1 mg Intravenous Given 12/04/15 2239)  sodium chloride 0.9 % bolus 1,000 mL (0 mLs Intravenous Stopped 12/05/15 0038)  Chlorhexidine Gluconate Cloth 2 % PADS 6 each (6 each Topical Given 12/05/15 2148)  ceFAZolin (ANCEF) IVPB 2g/100 mL premix (2 g Intravenous Given 12/06/15 1150)  potassium chloride (KLOR-CON) packet 20 mEq (20 mEq Oral Given 12/07/15 2156)  potassium chloride 10 mEq in 100 mL IVPB (10 mEq Intravenous Given 12/08/15 0553)  potassium chloride 10 [id number] IVPB (  Duplicate [id number])    Initial Impression / Assessment and Plan / ED Course  I have reviewed the triage vital signs and the nursing notes.  Pertinent labs & imaging  results that were available during my care of the patient were reviewed by me and considered in my medical decision making (see chart for details).  Clinical Course  Value Comment By Time  DG Chest 2 View (Reviewed) Voncille Lo, MD 09/26 1908  DG Chest 2 View (Reviewed) Voncille Lo, MD 09/26 1908   Initial Impression & Plan Patient presents for 1 day of right sided costal pain of sudden onset associated w/ Murphys Sign and pleurisy. Previous CVA w/o CAD or previous DVT/PE. ECG w/ sinus tachycardia w/o acute STEMI or arrhythmia. CBC and LFTs unremarkable. BMP with Cr at baseline. CXR unremarkable for PNA, PTX, or Pulmonary Edema. I have considered and estimate there is low risk for Sepsis, ACS, PE, cholangitis, perforated viscous, or volvulus or pancreatitis as the patient's presentation is not consistent. Murphys sign present therefore will obtain RUQ [place] due to concern for GB disease.  ED Course & Results US shows no acute cholecystitis but large stone. IV analgesia and IV antiemetics provided.  Final Disposition Reassessment of the patient reveals concern for GB and continued pain in RUQ. I therefore contacted the Surgery Service for assistance in the patient's care. After thorough discussion of the patient's ED course, we are in agreement that the patient  will require admission for consideration of GB removal. Level of care per admitting service. Patient stable for transport.  Final Clinical Impressions(s) / ED Diagnoses   Final diagnoses:  RUQ pain  Calculus of gallbladder without cholecystitis without obstruction     Voncille Lo, MD 12/10/15 2043    Orlie Dakin, MD 12/10/15 2359

## 2015-12-04 NOTE — ED Triage Notes (Signed)
Pt reports chest pain that started around 1300. She was diaphoretic on arrival. Pt also reports vomiting. Hx of stroke.

## 2015-12-04 NOTE — ED Notes (Signed)
Pt remains in US at this time.

## 2015-12-04 NOTE — ED Notes (Signed)
Pt still nauseated, vomiting. Husband sitting with patient.

## 2015-12-04 NOTE — ED Provider Notes (Signed)
Complains of right-sided abdominal pain onset 1 PM today, gradual onset with several episodes of vomiting. Pain started at right lower quadrant has since moved to right upper quadrant. Pain is worse with deep inspiration. She denies nausea at present. No other associated symptoms. On exam nontoxic appears mildly uncomfortable. Lungs clear auscultation heart regular rate and rhythm abdomen obese, normoactive bowel sounds, tender at right upper quadrant with positive Murphy sign no right lower quadrant tenderness extremities without edema   Courtney Dakin, MD 12/05/15 [id number]

## 2015-12-05 ENCOUNTER — Other Ambulatory Visit: Payer: Self-pay | Admitting: *Deleted

## 2015-12-05 ENCOUNTER — Ambulatory Visit: Payer: Self-pay | Admitting: General Surgery

## 2015-12-05 DIAGNOSIS — K8020 Calculus of gallbladder without cholecystitis without obstruction: Secondary | ICD-10-CM | POA: Diagnosis present

## 2015-12-05 LAB — BASIC METABOLIC PANEL
Anion gap: 9 (ref 5–15)
BUN: 13 mg/dL (ref 6–20)
CO2: 25 mmol/L (ref 22–32)
Calcium: 8.2 mg/dL — ABNORMAL LOW (ref 8.9–10.3)
Chloride: 107 mmol/L (ref 101–111)
Creatinine, Ser: 0.96 mg/dL (ref 0.44–1.00)
GFR calc Af Amer: >60 mL/min (ref >60)
GFR calc non Af Amer: >60 mL/min (ref >60)
Glucose, Bld: 126 mg/dL — ABNORMAL HIGH (ref 65–99)
Potassium: 3.8 mmol/L (ref 3.5–5.1)
Sodium: 141 mmol/L (ref 135–145)

## 2015-12-05 LAB — CBC
HCT: 39.9 % (ref 36.0–46.0)
Hemoglobin: 13 g/dL (ref 12.0–15.0)
MCH: 29.2 pg (ref 26.0–34.0)
MCHC: 32.6 g/dL (ref 30.0–36.0)
MCV: 89.7 fL (ref 78.0–100.0)
Platelets: 198 10*3/uL (ref 150–400)
RBC: 4.45 MIL/uL (ref 3.87–5.11)
RDW: 14 % (ref 11.5–15.5)
WBC: 11.7 10*3/uL — ABNORMAL HIGH (ref 4.0–10.5)

## 2015-12-05 LAB — PROTIME-INR
INR: 1.03
Prothrombin Time: 13.5 seconds (ref 11.4–15.2)

## 2015-12-05 LAB — APTT: aPTT: 33 seconds (ref 24–36)

## 2015-12-05 LAB — SURGICAL PCR SCREEN
MRSA, PCR: NEGATIVE
Staphylococcus aureus: NEGATIVE

## 2015-12-05 MED ORDER — GABAPENTIN 600 MG PO TABS: 600.0000 mg | ORAL_TABLET | Freq: Two times a day (BID) | ORAL | 1 refills | Status: DC

## 2015-12-05 MED ORDER — SERTRALINE HCL 50 MG PO TABS
50.0000 mg | ORAL_TABLET | Freq: Two times a day (BID) | ORAL | Status: DC
  Administered 2015-12-05 – 2015-12-08 (×7): 50 mg via ORAL
  Filled 2015-12-05 (×7): qty 1

## 2015-12-05 MED ORDER — HYDROMORPHONE HCL 1 MG/ML IJ SOLN
0.5000 mg | INTRAMUSCULAR | Status: DC | PRN
  Administered 2015-12-05 – 2015-12-08 (×10): 0.5 mg via INTRAVENOUS
  Filled 2015-12-05 (×10): qty 1

## 2015-12-05 MED ORDER — ACETAMINOPHEN 650 MG RE SUPP: 650.0000 mg | Freq: Four times a day (QID) | RECTAL | Status: DC | PRN

## 2015-12-05 MED ORDER — OXYCODONE HCL 5 MG PO TABS
5.0000 mg | ORAL_TABLET | ORAL | Status: DC | PRN
  Administered 2015-12-05 – 2015-12-08 (×16): 10 mg via ORAL
  Filled 2015-12-05 (×16): qty 2

## 2015-12-05 MED ORDER — PIPERACILLIN-TAZOBACTAM 3.375 G IVPB
3.3750 g | Freq: Three times a day (TID) | INTRAVENOUS | Status: DC
  Administered 2015-12-05 – 2015-12-07 (×9): 3.375 g via INTRAVENOUS
  Filled 2015-12-05 (×12): qty 50

## 2015-12-05 MED ORDER — ACETAMINOPHEN 325 MG PO TABS
650.0000 mg | ORAL_TABLET | Freq: Four times a day (QID) | ORAL | Status: DC | PRN
  Administered 2015-12-05: 650 mg via ORAL
  Filled 2015-12-05: qty 2

## 2015-12-05 MED ORDER — SODIUM CHLORIDE 0.9 % IV SOLN
INTRAVENOUS | Status: DC
  Administered 2015-12-05 – 2015-12-08 (×8): via INTRAVENOUS

## 2015-12-05 MED ORDER — CHLORHEXIDINE GLUCONATE CLOTH 2 % EX PADS
6.0000 | MEDICATED_PAD | Freq: Once | CUTANEOUS | Status: AC
  Administered 2015-12-05: 6 via TOPICAL

## 2015-12-05 MED ORDER — PNEUMOCOCCAL VAC POLYVALENT 25 MCG/0.5ML IJ INJ: 0.5000 mL | INJECTION | INTRAMUSCULAR | Status: DC | PRN

## 2015-12-05 MED ORDER — ONDANSETRON 4 MG PO TBDP: 4.0000 mg | ORAL_TABLET | Freq: Four times a day (QID) | ORAL | Status: DC | PRN

## 2015-12-05 MED ORDER — DOCUSATE SODIUM 100 MG PO CAPS
100.0000 mg | ORAL_CAPSULE | Freq: Two times a day (BID) | ORAL | Status: DC
  Administered 2015-12-05 – 2015-12-08 (×7): 100 mg via ORAL
  Filled 2015-12-05 (×7): qty 1

## 2015-12-05 MED ORDER — MIDAZOLAM HCL 2 MG/2ML IJ SOLN
INTRAMUSCULAR | Status: AC
  Filled 2015-12-05: qty 2

## 2015-12-05 MED ORDER — ONDANSETRON HCL 4 MG/2ML IJ SOLN
4.0000 mg | Freq: Four times a day (QID) | INTRAMUSCULAR | Status: DC | PRN
  Administered 2015-12-07: 4 mg via INTRAVENOUS

## 2015-12-05 MED ORDER — INFLUENZA VAC SPLIT QUAD 0.5 ML IM SUSY: 0.5000 mL | PREFILLED_SYRINGE | INTRAMUSCULAR | Status: DC | PRN

## 2015-12-05 MED ORDER — ENOXAPARIN SODIUM 40 MG/0.4ML ~~LOC~~ SOLN
40.0000 mg | SUBCUTANEOUS | Status: DC
  Administered 2015-12-07: 40 mg via SUBCUTANEOUS
  Filled 2015-12-05: qty 0.4

## 2015-12-05 MED ORDER — SENNA 8.6 MG PO TABS
1.0000 | ORAL_TABLET | Freq: Two times a day (BID) | ORAL | Status: DC
  Administered 2015-12-05 – 2015-12-08 (×7): 8.6 mg via ORAL
  Filled 2015-12-05 (×8): qty 1

## 2015-12-05 MED ORDER — CEFAZOLIN SODIUM-DEXTROSE 2-4 GM/100ML-% IV SOLN
2.0000 g | INTRAVENOUS | Status: AC
  Administered 2015-12-06: 2 g via INTRAVENOUS
  Filled 2015-12-05 (×2): qty 100

## 2015-12-05 MED ORDER — CHLORHEXIDINE GLUCONATE CLOTH 2 % EX PADS: 6.0000 | MEDICATED_PAD | Freq: Once | CUTANEOUS | Status: DC

## 2015-12-05 MED ORDER — GABAPENTIN 600 MG PO TABS
600.0000 mg | ORAL_TABLET | Freq: Two times a day (BID) | ORAL | Status: DC
  Administered 2015-12-05 – 2015-12-08 (×7): 600 mg via ORAL
  Filled 2015-12-05 (×7): qty 1

## 2015-12-05 MED ORDER — TOPIRAMATE 100 MG PO TABS
100.0000 mg | ORAL_TABLET | Freq: Two times a day (BID) | ORAL | Status: DC
  Administered 2015-12-05 – 2015-12-08 (×7): 100 mg via ORAL
  Filled 2015-12-05: qty 1
  Filled 2015-12-05 (×3): qty 4
  Filled 2015-12-05 (×2): qty 1
  Filled 2015-12-05 (×2): qty 4

## 2015-12-05 NOTE — Anesthesia Preprocedure Evaluation (Addendum)
Anesthesia Evaluation  Patient identified by MRN, date of birth, ID band Patient awake    Reviewed: Allergy & Precautions, NPO status , Patient's Chart, lab work & pertinent test results  History of Anesthesia Complications Negative for: history of anesthetic complications  Airway Mallampati: III  TM Distance: >3 FB Neck ROM: Full    Dental  (+)    Pulmonary neg shortness of breath, neg sleep apnea, neg COPD, neg recent URI, former smoker,    Pulmonary exam normal breath sounds clear to auscultation       Cardiovascular Exercise Tolerance: Poor hypertension, Pt. on medications + angina (began when she had her stroke in 2015, stable) with exertion (-) Past MI, (-) Cardiac Stents, (-) CABG and (-) Orthopnea  Rhythm:Regular Rate:Normal  HLD  EKG 12/05/2015: NSR  TTE 10/12/2013: Study Conclusions  - Left ventricle: The cavity size was normal. Wall thickness was normal. Systolic function was normal. The estimated ejection fraction was in the range of 55% to 60%. Wall motion was normal; there were no regional wall motion abnormalities. Left ventricular diastolic function parameters were normal. - Aortic valve: There was no stenosis. - Mitral valve: There was trivial regurgitation. - Right ventricle: The cavity size was normal. Systolic function was normal. - Tricuspid valve: Peak RV-RA gradient (S): 26 mm Hg. - Pulmonary arteries: PA peak pressure: 29 mm Hg (S). - Inferior vena cava: The vessel was normal in size. The respirophasic diameter changes were in the normal range (>= 50%), consistent with normal central venous pressure.  Impressions:  - Normal LV size and systolic function, EF 0000000. Normal diastolic function. Normal RV size and systolic function. No significant valvular abnormalities.  Carotid Dopplers 10/11/2013: Summary:  - The vertebral arteries appear patent with antegrade flow. - Findings  consistent with 72 - 27 percent stenosis involving the right internal carotid artery. - Findings consistent with 1- 39 percent stenosis involving the left internal carotid artery.   Neuro/Psych  Headaches, PSYCHIATRIC DISORDERS Anxiety Depression Occipital and intercostal neuralgia, myofascial pain CVA (10/2013, on Plavix, last dose 12/03/2015)    GI/Hepatic Neg liver ROS, GERD  ,Symptomatic cholelithiasis   Endo/Other  negative endocrine ROS  Renal/GU CRFRenal disease (stage III)     Musculoskeletal   Abdominal   Peds  Hematology negative hematology ROS (+)   Anesthesia Other Findings   Reproductive/Obstetrics                            Anesthesia Physical Anesthesia Plan  ASA: III  Anesthesia Plan: General   Post-op Pain Management:    Induction: Intravenous  Airway Management Planned: Oral ETT  Additional Equipment:   Intra-op Plan:   Post-operative Plan: Extubation in OR  Informed Consent:   Dental advisory given  Plan Discussed with: CRNA  Anesthesia Plan Comments: (Risks of general anesthesia discussed including, but not limited to, sore throat, hoarse voice, chipped/damaged teeth, injury to vocal cords, nausea and vomiting, allergic reactions, lung infection, heart attack, stroke, and death. All questions answered. )        Anesthesia Quick Evaluation

## 2015-12-05 NOTE — ED Notes (Signed)
Report called to Angela Nevin, RN at this time.  Receiving nurse denies having any further questions at this time.

## 2015-12-05 NOTE — Progress Notes (Signed)
[facility] Progress Note     Subjective: NAE overnight. RUQ pain that radiates to her epigastrium/chest. Rated as 7/10 before pain meds and 4/10 after pain meds. Denies nausea/vomitnig. Ambulating.  Objective: Vital signs in last 24 hours: Temp:  [97.9 F (36.6 C)-99.2 F (37.3 C)] 99.2 F (37.3 C) (09/27 0355) Pulse Rate:  [68-92] 83 (09/27 0355) Resp:  [11-28] 17 (09/27 0355) BP: (119-188)/(73-98) 136/76 (09/27 0355) SpO2:  [91 %-100 %] 96 % (09/27 0355) Weight:  [72.6 kg (160 lb)-75.5 kg (166 lb 6.4 oz)] 75.5 kg (166 lb 6.4 oz) (09/27 0247) Last BM Date: 12/04/15  Intake/Output from previous day: 09/26 0701 - 09/27 0700 In: 591.7 [I.V.:491.7; IV Piggyback:100] Out: 200 [Urine:200] Intake/Output this shift: Total I/O In: 320.8 [I.V.:320.8] Out: -   PE: Gen:  Alert, NAD, pleasant Card:  RRR, no M/G/R heard Pulm:  CTA, no W/R/R Abd: Soft, TTP RUQ, ND, +BS  Lab Results:   Recent Labs  12/04/15 1558 12/05/15 0406  WBC 11.8* 11.7*  HGB 14.6 13.0  HCT 43.3 39.9  PLT 238 198   BMET  Recent Labs  12/04/15 1558 12/05/15 0406  NA 138 141  K 3.1* 3.8  CL 104 107  CO2 21* 25  GLUCOSE 123* 126*  BUN 20 13  CREATININE 1.14* 0.96  CALCIUM 9.3 8.2*   PT/INR  Recent Labs  12/05/15 0406  LABPROT 13.5  INR 1.03   CMP     Component Value Date/Time   NA 141 12/05/2015 0406   K 3.8 12/05/2015 0406   CL 107 12/05/2015 0406   CO2 25 12/05/2015 0406   GLUCOSE 126 (H) 12/05/2015 0406   BUN 13 12/05/2015 0406   CREATININE 0.96 12/05/2015 0406   CREATININE 1.17 (H) 07/13/2015 1056   CALCIUM 8.2 (L) 12/05/2015 0406   PROT 7.1 12/04/2015 1602   ALBUMIN 4.4 12/04/2015 1602   AST 27 12/04/2015 1602   ALT 26 12/04/2015 1602   ALKPHOS 87 12/04/2015 1602   BILITOT 0.7 12/04/2015 1602   GFRNONAA >60 12/05/2015 0406   GFRNONAA 50 (L) 07/13/2015 1056   GFRAA >60 12/05/2015 0406   GFRAA 57 (L) 07/13/2015 1056   Lipase     Component Value  Date/Time   LIPASE 27 12/04/2015 1602       Studies/Results: Dg Chest 2 View  Result Date: 12/04/2015 CLINICAL DATA:  Chest pain in shortness of breath today. EXAM: CHEST  2 VIEW COMPARISON:  10/10/2013 chest radiograph. 06/21/2013 CT abdomen and pelvis. FINDINGS: The cardiomediastinal silhouette is within normal limits. The lungs are well inflated and clear. There is no evidence of pleural effusion or pneumothorax. Abdominal aortic atherosclerosis is noted. There is a mild chronic L1 compression fracture. IMPRESSION: 1. No active cardiopulmonary disease. 2. Aortic atherosclerosis. Electronically Signed   By: Logan Bores M.D.   On: 12/04/2015 16:25   US Abdomen Limited Ruq  Result Date: 12/04/2015 CLINICAL DATA:  Acute onset of right upper quadrant abdominal pain. Initial encounter. EXAM: US ABDOMEN LIMITED - RIGHT UPPER QUADRANT COMPARISON:  CT of the abdomen and pelvis performed 06/21/2013 FINDINGS: Gallbladder: A 1.3 cm stone is noted about the base of the gallbladder, mobile but remaining near the neck. No gallbladder wall thickening or pericholecystic fluid is seen. No ultrasonographic Murphy's sign is elicited. Common bile duct: Diameter: 0.7 cm, within normal limits in caliber for the patient's age. Liver: No focal lesion identified. Within normal limits in parenchymal echogenicity. IMPRESSION: 1.3 cm stone noted about the  base of the gallbladder, mobile but remaining near the neck. No definite evidence for obstruction or cholecystitis. Electronically Signed   By: Garald Balding M.D.   On: 12/04/2015 21:15    Anti-infectives: Anti-infectives    Start     Dose/Rate Route Frequency Ordered Stop   12/06/15 0600  ceFAZolin (ANCEF) IVPB 2g/100 mL premix     2 g 200 mL/hr over 30 Minutes Intravenous To Surgery 12/05/15 0841 12/07/15 0600   12/05/15 0045  piperacillin-tazobactam (ZOSYN) IVPB 3.375 g     3.375 g 12.5 mL/hr over 240 Minutes Intravenous Every 8 hours 12/05/15 0037        Assessment/Plan Symptomatic cholelithiasis Leukocytosis - 11.7 - LFT's and lipase are WNL  PMH CVA - Plavix held, last taken Monday 12/03/15  FEN: IVF, clear liquids, NPO after MN ID: Zosyn DVT Proph: SCD's  Dispo: continue to hold plavix, AM labs, likely OR tomorrow for laparoscopic cholecystectomy   LOS: 0 days    Jill Alexanders , [facility] 12/05/2015, 9:01 AM Pager: [phone] Consults: [phone] Mon-Fri 7:00 am-4:30 pm Sat-Sun 7:00 am-11:30 am

## 2015-12-06 ENCOUNTER — Encounter (HOSPITAL_COMMUNITY): Admission: EM | Disposition: A | Discharge status: Home / Self Care | Payer: Self-pay | Source: Home / Self Care

## 2015-12-06 ENCOUNTER — Encounter (HOSPITAL_COMMUNITY): Payer: Self-pay | Admitting: Certified Registered"

## 2015-12-06 ENCOUNTER — Observation Stay (HOSPITAL_COMMUNITY): Payer: BLUE CROSS/BLUE SHIELD | Admitting: Anesthesiology

## 2015-12-06 ENCOUNTER — Observation Stay (HOSPITAL_COMMUNITY): Payer: BLUE CROSS/BLUE SHIELD

## 2015-12-06 HISTORY — PX: CHOLECYSTECTOMY: SHX55

## 2015-12-06 LAB — CBC
HCT: 36.2 % (ref 36.0–46.0)
Hemoglobin: 11.6 g/dL — ABNORMAL LOW (ref 12.0–15.0)
MCH: 29.1 pg (ref 26.0–34.0)
MCHC: 32 g/dL (ref 30.0–36.0)
MCV: 91 fL (ref 78.0–100.0)
Platelets: 164 10*3/uL (ref 150–400)
RBC: 3.98 MIL/uL (ref 3.87–5.11)
RDW: 14.6 % (ref 11.5–15.5)
WBC: 7.7 10*3/uL (ref 4.0–10.5)

## 2015-12-06 LAB — COMPREHENSIVE METABOLIC PANEL
ALT: 100 U/L — ABNORMAL HIGH (ref 14–54)
AST: 99 U/L — ABNORMAL HIGH (ref 15–41)
Albumin: 3.1 g/dL — ABNORMAL LOW (ref 3.5–5.0)
Alkaline Phosphatase: 86 U/L (ref 38–126)
Anion gap: 4 — ABNORMAL LOW (ref 5–15)
BUN: 5 mg/dL — ABNORMAL LOW (ref 6–20)
CO2: 25 mmol/L (ref 22–32)
Calcium: 7.7 mg/dL — ABNORMAL LOW (ref 8.9–10.3)
Chloride: 112 mmol/L — ABNORMAL HIGH (ref 101–111)
Creatinine, Ser: 1.07 mg/dL — ABNORMAL HIGH (ref 0.44–1.00)
GFR calc Af Amer: >60 mL/min (ref >60)
GFR calc non Af Amer: 54 mL/min — ABNORMAL LOW (ref >60)
Glucose, Bld: 109 mg/dL — ABNORMAL HIGH (ref 65–99)
Potassium: 3 mmol/L — ABNORMAL LOW (ref 3.5–5.1)
Sodium: 141 mmol/L (ref 135–145)
Total Bilirubin: 1.2 mg/dL (ref 0.3–1.2)
Total Protein: 5.3 g/dL — ABNORMAL LOW (ref 6.5–8.1)

## 2015-12-06 SURGERY — LAPAROSCOPIC CHOLECYSTECTOMY WITH INTRAOPERATIVE CHOLANGIOGRAM
Anesthesia: General | Site: Abdomen

## 2015-12-06 MED ORDER — PROMETHAZINE HCL 25 MG/ML IJ SOLN: 6.2500 mg | INTRAMUSCULAR | Status: DC | PRN

## 2015-12-06 MED ORDER — ONDANSETRON HCL 4 MG/2ML IJ SOLN
INTRAMUSCULAR | Status: AC
  Filled 2015-12-06: qty 2

## 2015-12-06 MED ORDER — BUPIVACAINE-EPINEPHRINE (PF) 0.25% -1:200000 IJ SOLN
INTRAMUSCULAR | Status: AC
  Filled 2015-12-06: qty 30

## 2015-12-06 MED ORDER — DEXAMETHASONE SODIUM PHOSPHATE 10 MG/ML IJ SOLN
INTRAMUSCULAR | Status: DC | PRN
  Administered 2015-12-06: 4 mg via INTRAVENOUS

## 2015-12-06 MED ORDER — 0.9 % SODIUM CHLORIDE (POUR BTL) OPTIME
TOPICAL | Status: DC | PRN
  Administered 2015-12-06: 1000 mL

## 2015-12-06 MED ORDER — SODIUM CHLORIDE 0.9 % IR SOLN
Status: DC | PRN
  Administered 2015-12-06: 1000 mL

## 2015-12-06 MED ORDER — DIPHENHYDRAMINE HCL 50 MG/ML IJ SOLN
INTRAMUSCULAR | Status: AC
  Filled 2015-12-06: qty 1

## 2015-12-06 MED ORDER — MIDAZOLAM HCL 5 MG/5ML IJ SOLN
INTRAMUSCULAR | Status: DC | PRN
  Administered 2015-12-06: 2 mg via INTRAVENOUS

## 2015-12-06 MED ORDER — LACTATED RINGERS IV SOLN
INTRAVENOUS | Status: DC
  Administered 2015-12-06: 11:00:00 via INTRAVENOUS

## 2015-12-06 MED ORDER — PHENYLEPHRINE 40 MCG/ML (10ML) SYRINGE FOR IV PUSH (FOR BLOOD PRESSURE SUPPORT)
PREFILLED_SYRINGE | INTRAVENOUS | Status: DC | PRN
Start: 2015-12-06 — End: 2015-12-06
  Administered 2015-12-06 (×2): 80 ug via INTRAVENOUS

## 2015-12-06 MED ORDER — MIDAZOLAM HCL 2 MG/2ML IJ SOLN
INTRAMUSCULAR | Status: AC
  Filled 2015-12-06: qty 2

## 2015-12-06 MED ORDER — SUFENTANIL CITRATE 50 MCG/ML IV SOLN
INTRAVENOUS | Status: AC
  Filled 2015-12-06: qty 1

## 2015-12-06 MED ORDER — SUGAMMADEX SODIUM 200 MG/2ML IV SOLN
INTRAVENOUS | Status: DC | PRN
  Administered 2015-12-06: 200 mg via INTRAVENOUS

## 2015-12-06 MED ORDER — FENTANYL CITRATE (PF) 100 MCG/2ML IJ SOLN
INTRAMUSCULAR | Status: AC
  Filled 2015-12-06: qty 2

## 2015-12-06 MED ORDER — ROCURONIUM BROMIDE 10 MG/ML (PF) SYRINGE
PREFILLED_SYRINGE | INTRAVENOUS | Status: DC | PRN
  Administered 2015-12-06: 40 mg via INTRAVENOUS

## 2015-12-06 MED ORDER — SODIUM CHLORIDE 0.9 % IV SOLN
INTRAVENOUS | Status: DC | PRN
  Administered 2015-12-06: 18 mL

## 2015-12-06 MED ORDER — LIDOCAINE 2% (20 MG/ML) 5 ML SYRINGE
INTRAMUSCULAR | Status: DC | PRN
  Administered 2015-12-06: 100 mg via INTRAVENOUS

## 2015-12-06 MED ORDER — ONDANSETRON HCL 4 MG/2ML IJ SOLN
INTRAMUSCULAR | Status: DC | PRN
  Administered 2015-12-06: 4 mg via INTRAVENOUS

## 2015-12-06 MED ORDER — SUGAMMADEX SODIUM 200 MG/2ML IV SOLN
INTRAVENOUS | Status: AC
  Filled 2015-12-06: qty 2

## 2015-12-06 MED ORDER — LIDOCAINE 2% (20 MG/ML) 5 ML SYRINGE
INTRAMUSCULAR | Status: AC
  Filled 2015-12-06: qty 5

## 2015-12-06 MED ORDER — DIPHENHYDRAMINE HCL 50 MG/ML IJ SOLN
INTRAMUSCULAR | Status: DC | PRN
  Administered 2015-12-06: 10 mg via INTRAVENOUS

## 2015-12-06 MED ORDER — PROPOFOL 10 MG/ML IV BOLUS
INTRAVENOUS | Status: DC | PRN
  Administered 2015-12-06: 150 mg via INTRAVENOUS

## 2015-12-06 MED ORDER — PHENYLEPHRINE 40 MCG/ML (10ML) SYRINGE FOR IV PUSH (FOR BLOOD PRESSURE SUPPORT)
PREFILLED_SYRINGE | INTRAVENOUS | Status: AC
  Filled 2015-12-06: qty 10

## 2015-12-06 MED ORDER — DEXAMETHASONE SODIUM PHOSPHATE 10 MG/ML IJ SOLN
INTRAMUSCULAR | Status: AC
  Filled 2015-12-06: qty 1

## 2015-12-06 MED ORDER — IOPAMIDOL (ISOVUE-300) INJECTION 61%
INTRAVENOUS | Status: AC
  Filled 2015-12-06: qty 50

## 2015-12-06 MED ORDER — PROPOFOL 10 MG/ML IV BOLUS
INTRAVENOUS | Status: AC
  Filled 2015-12-06: qty 20

## 2015-12-06 MED ORDER — FENTANYL CITRATE (PF) 100 MCG/2ML IJ SOLN
25.0000 ug | INTRAMUSCULAR | Status: DC | PRN
  Administered 2015-12-06 (×4): 25 ug via INTRAVENOUS

## 2015-12-06 MED ORDER — BUPIVACAINE-EPINEPHRINE (PF) 0.25% -1:200000 IJ SOLN
INTRAMUSCULAR | Status: DC | PRN
  Administered 2015-12-06: 12 mL

## 2015-12-06 MED ORDER — LIDOCAINE HCL (PF) 1 % IJ SOLN
INTRAMUSCULAR | Status: AC
  Filled 2015-12-06: qty 30

## 2015-12-06 MED ORDER — ROCURONIUM BROMIDE 10 MG/ML (PF) SYRINGE
PREFILLED_SYRINGE | INTRAVENOUS | Status: AC
  Filled 2015-12-06: qty 10

## 2015-12-06 MED ORDER — SUFENTANIL CITRATE 50 MCG/ML IV SOLN
INTRAVENOUS | Status: DC | PRN
  Administered 2015-12-06: 10 ug via INTRAVENOUS
  Administered 2015-12-06 (×2): 5 ug via INTRAVENOUS
  Administered 2015-12-06: 15 ug via INTRAVENOUS

## 2015-12-06 SURGICAL SUPPLY — 46 items
ADH SKN CLS APL DERMABOND .7 (GAUZE/BANDAGES/DRESSINGS) ×1
APPLIER CLIP ROT 10 11.4 M/L (STAPLE) ×2
APR CLP MED LRG 11.4X10 (STAPLE) ×1
BAG SPEC RTRVL LRG 6X4 10 (ENDOMECHANICALS) ×1
BLADE SURG ROTATE 9660 (MISCELLANEOUS) IMPLANT
CANISTER SUCTION 2500CC (MISCELLANEOUS) ×2 IMPLANT
CHLORAPREP W/TINT 26ML (MISCELLANEOUS) ×2 IMPLANT
CLIP APPLIE ROT 10 11.4 M/L (STAPLE) ×1 IMPLANT
COVER MAYO STAND STRL (DRAPES) ×2 IMPLANT
COVER SURGICAL LIGHT HANDLE (MISCELLANEOUS) ×2 IMPLANT
DERMABOND ADVANCED (GAUZE/BANDAGES/DRESSINGS) ×1
DERMABOND ADVANCED .7 DNX12 (GAUZE/BANDAGES/DRESSINGS) ×1 IMPLANT
DRAPE C-ARM 42X72 X-RAY (DRAPES) ×2 IMPLANT
DRAPE WARM FLUID 44X44 (DRAPE) ×2 IMPLANT
ELECT REM PT RETURN 9FT ADLT (ELECTROSURGICAL) ×2
ELECTRODE REM PT RTRN 9FT ADLT (ELECTROSURGICAL) ×1 IMPLANT
FILTER SMOKE EVAC LAPAROSHD (FILTER) IMPLANT
GLOVE BIO SURGEON STRL SZ 6 (GLOVE) ×3 IMPLANT
GLOVE BIOGEL PI IND STRL 6.5 (GLOVE) ×1 IMPLANT
GLOVE BIOGEL PI INDICATOR 6.5 (GLOVE) ×2
GOWN STRL REUS W/ TWL LRG LVL3 (GOWN DISPOSABLE) ×2 IMPLANT
GOWN STRL REUS W/TWL 2XL LVL3 (GOWN DISPOSABLE) ×2 IMPLANT
GOWN STRL REUS W/TWL LRG LVL3 (GOWN DISPOSABLE) ×8
HEMOSTAT SNOW SURGICEL 2X4 (HEMOSTASIS) ×1 IMPLANT
KIT BASIN OR (CUSTOM PROCEDURE TRAY) ×2 IMPLANT
KIT ROOM TURNOVER OR (KITS) ×2 IMPLANT
L-HOOK LAP DISP 36CM (ELECTROSURGICAL) ×2
LHOOK LAP DISP 36CM (ELECTROSURGICAL) ×1 IMPLANT
NS IRRIG 1000ML POUR BTL (IV SOLUTION) ×2 IMPLANT
PAD ARMBOARD 7.5X6 YLW CONV (MISCELLANEOUS) ×2 IMPLANT
PENCIL BUTTON HOLSTER BLD 10FT (ELECTRODE) ×2 IMPLANT
POUCH SPECIMEN RETRIEVAL 10MM (ENDOMECHANICALS) ×2 IMPLANT
SCISSORS LAP 5X35 DISP (ENDOMECHANICALS) ×2 IMPLANT
SET CHOLANGIOGRAPH 5 50 .035 (SET/KITS/TRAYS/PACK) ×2 IMPLANT
SET IRRIG TUBING LAPAROSCOPIC (IRRIGATION / IRRIGATOR) ×2 IMPLANT
SLEEVE ENDOPATH XCEL 5M (ENDOMECHANICALS) ×2 IMPLANT
SPECIMEN JAR SMALL (MISCELLANEOUS) ×2 IMPLANT
SUT MNCRL AB 4-0 PS2 18 (SUTURE) ×3 IMPLANT
SUT VICRYL 0 UR6 27IN ABS (SUTURE) ×1 IMPLANT
TOWEL OR 17X24 6PK STRL BLUE (TOWEL DISPOSABLE) ×2 IMPLANT
TOWEL OR 17X26 10 PK STRL BLUE (TOWEL DISPOSABLE) ×2 IMPLANT
TRAY LAPAROSCOPIC MC (CUSTOM PROCEDURE TRAY) ×2 IMPLANT
TROCAR XCEL BLUNT TIP 100MML (ENDOMECHANICALS) ×2 IMPLANT
TROCAR XCEL NON-BLD 11X100MML (ENDOMECHANICALS) ×2 IMPLANT
TROCAR XCEL NON-BLD 5MMX100MML (ENDOMECHANICALS) ×2 IMPLANT
TUBING INSUFFLATION (TUBING) ×2 IMPLANT

## 2015-12-06 NOTE — Transfer of Care (Signed)
Immediate Anesthesia Transfer of Care Note  Patient: [name]  Procedure(s) Performed: Procedure(s): LAPAROSCOPIC CHOLECYSTECTOMY WITH  INTRAOPERATIVE CHOLANGIOGRAM (N/A)  Patient Location: PACU  Anesthesia Type:General  Level of Consciousness: awake, alert , oriented and patient cooperative  Airway & Oxygen Therapy: Patient Spontanous Breathing and Patient connected to nasal cannula oxygen  Post-op Assessment: Report given to RN and Post -op Vital signs reviewed and stable  Post vital signs: Reviewed and stable  Last Vitals:  Vitals:   12/06/15 0428 12/06/15 0924  BP: 140/63 (!) 104/52  Pulse: 81 88  Resp: 18 16  Temp: 37.2 C 37.1 C    Last Pain:  Vitals:   12/06/15 1035  TempSrc:   PainSc: 4       Patients Stated Pain Goal: 2 ([id number])  Complications: No apparent anesthesia complications

## 2015-12-06 NOTE — Anesthesia Postprocedure Evaluation (Signed)
Anesthesia Post Note  Patient: [name]  Procedure(s) Performed: Procedure(s) (LRB): LAPAROSCOPIC CHOLECYSTECTOMY WITH  INTRAOPERATIVE CHOLANGIOGRAM (N/A)  Patient location during evaluation: PACU Anesthesia Type: General Level of consciousness: awake and alert Pain management: pain level controlled Vital Signs Assessment: post-procedure vital signs reviewed and stable Respiratory status: spontaneous breathing, nonlabored ventilation and respiratory function stable Cardiovascular status: blood pressure returned to baseline and stable Postop Assessment: no signs of nausea or vomiting Anesthetic complications: no    Last Vitals:  Vitals:   12/06/15 1500 12/06/15 1501  BP:  (!) 173/82  Pulse: 89 87  Resp: 13 14  Temp:      Last Pain:  Vitals:   12/06/15 1500  TempSrc:   PainSc: 6                  Nilda Simmer

## 2015-12-06 NOTE — Op Note (Signed)
Laparoscopic Cholecystectomy with IOC Procedure Note  Indications: This patient presents with acute calculous cholecystitis and will undergo laparoscopic cholecystectomy. Has been a 24 hour delay due to eliquis taken Monday AM.    Pre-operative Diagnosis: Acute calculous cholecystitis  Post-operative Diagnosis: Same  Surgeon: Stark Klein   Assistants: Melina Modena, PA-C  Anesthesia: General endotracheal anesthesia and local  ASA Class: 3  Procedure Details  The patient was seen again in the Holding Room. The risks, benefits, complications, treatment options, and expected outcomes were discussed with the patient. The possibilities of  bleeding, recurrent infection, damage to nearby structures, the need for additional procedures, failure to diagnose a condition, the possible need to convert to an open procedure, and creating a complication requiring transfusion or operation were discussed with the patient. The likelihood of improving the patient's symptoms with return to their baseline status is good.    The patient and/or family concurred with the proposed plan, giving informed consent. The site of surgery properly noted. The patient was taken to Operating Room, and the procedure verified as Laparoscopic Cholecystectomy with Intraoperative Cholangiogram. A Time Out was held and the above information confirmed.  A skilled assistant was required due to the acute inflammation of the gallbladder, recent anticoagulation and increased difficulty of the case.    Prior to the induction of general anesthesia, antibiotic prophylaxis was administered. General endotracheal anesthesia was then administered and tolerated well. After the induction, the abdomen was prepped with Chloraprep and draped in the sterile fashion. The patient was positioned in the supine position.  Local anesthetic agent was injected into the skin near the umbilicus and an incision made. We dissected down to the abdominal fascia with  blunt dissection.  The fascia was incised vertically and we entered the peritoneal cavity bluntly.  A pursestring suture of 0-Vicryl was placed around the fascial opening.  The Hasson cannula was inserted and secured with the stay suture.  Pneumoperitoneum was then created with CO2 and tolerated well without any adverse changes in the patient's vital signs. An 11-mm port was placed in the subxiphoid position.  Two 5-mm ports were placed in the right upper quadrant. All skin incisions were infiltrated with a local anesthetic agent before making the incision and placing the trocars.   We positioned the patient in reverse Trendelenburg, tilted slightly to the patient's left.  The gallbladder was identified, and was very distended.  The gallbladder was aspirated with the Nezhat suction. The fundus was grasped and retracted cephalad. Adhesions were lysed bluntly and with the electrocautery where indicated, taking care not to injure any adjacent organs or viscus. The infundibulum was grasped and retracted laterally, exposing the peritoneum overlying the triangle of Calot. This was then divided and exposed in a blunt fashion. A critical view of the cystic duct and cystic artery was obtained.  The cystic duct was clearly identified and bluntly dissected circumferentially. The cystic duct was ligated with a clip distally.   An incision was made in the cystic duct and the [facility] cholangiogram catheter introduced. The catheter was secured using a clip. A cholangiogram was then performed, demonstrating normal anatomy, but dilated CBD.  The cystic duct was then ligated with clips and divided. The cystic artery was identified, dissected free, ligated with clips and divided as well.   The gallbladder was dissected from the liver bed in retrograde fashion with the electrocautery. The gallbladder was removed and placed in an Endocatch bag.  The gallbladder and Endocatch bag were then removed through the umbilical  port site.  The  liver bed was irrigated and inspected. Hemostasis was achieved with the electrocautery. Copious irrigation was utilized and was repeatedly aspirated until clear.    We again inspected the right upper quadrant for hemostasis.  Pneumoperitoneum was released as we removed the trocars.   The pursestring suture was used to close the umbilical fascia.  4-0 Monocryl was used to close the skin.   The skin was cleaned and dry, and Dermabond was applied. The patient was then extubated and brought to the recovery room in stable condition. Instrument, sponge, and needle counts were correct at closure and at the conclusion of the case.   Findings: Acute inflammation, distended gallbladder.  Common bile duct distended on cholangiogram, but contrast eventually fills the duodenum.  Normal anatomy.    Estimated Blood Loss: 50 mL         Drains: none          Specimens: Gallbladder to pathology       Complications: None; patient tolerated the procedure well.         Disposition: PACU - hemodynamically stable.         Condition: stable

## 2015-12-06 NOTE — Progress Notes (Signed)
[place] [place] [facility] Progress Note  Day of Surgery  Subjective: Persistent 7/10 RUQ pain. Denies nausea/vomiting.  Objective: Vital signs in last 24 hours: Temp:  [97.6 F (36.4 C)-100.2 F (37.9 C)] 98.8 F (37.1 C) (09/28 0924) Pulse Rate:  [54-88] 88 (09/28 0924) Resp:  [16-18] 16 (09/28 0924) BP: (104-140)/(52-64) 104/52 (09/28 0924) SpO2:  [94 %-97 %] 95 % (09/28 0924) Last BM Date: 12/04/15  Intake/Output from previous day: 09/27 0701 - 09/28 0700 In: 3667.7 [P.O.:600; I.V.:3017.7; IV Piggyback:50] Out: 2150 [Urine:2150] Intake/Output this shift: Total I/O In: 708.3 [I.V.:608.3; IV Piggyback:100] Out: -   PE: Gen:  Alert, NAD, pleasant Abd: Soft, RUQ tenderness without guarding or peritonitis  Lab Results:   Recent Labs  12/05/15 0406 12/06/15 0602  WBC 11.7* 7.7  HGB 13.0 11.6*  HCT 39.9 36.2  PLT 198 164   BMET  Recent Labs  12/05/15 0406 12/06/15 0602  NA 141 141  K 3.8 3.0*  CL 107 112*  CO2 25 25  GLUCOSE 126* 109*  BUN 13 5*  CREATININE 0.96 1.07*  CALCIUM 8.2* 7.7*   PT/INR  Recent Labs  12/05/15 0406  LABPROT 13.5  INR 1.03   CMP     Component Value Date/Time   NA 141 12/06/2015 0602   K 3.0 (L) 12/06/2015 0602   CL 112 (H) 12/06/2015 0602   CO2 25 12/06/2015 0602   GLUCOSE 109 (H) 12/06/2015 0602   BUN 5 (L) 12/06/2015 0602   CREATININE 1.07 (H) 12/06/2015 0602   CREATININE 1.17 (H) 07/13/2015 1056   CALCIUM 7.7 (L) 12/06/2015 0602   PROT 5.3 (L) 12/06/2015 0602   ALBUMIN 3.1 (L) 12/06/2015 0602   AST 99 (H) 12/06/2015 0602   ALT 100 (H) 12/06/2015 0602   ALKPHOS 86 12/06/2015 0602   BILITOT 1.2 12/06/2015 0602   GFRNONAA 54 (L) 12/06/2015 0602   GFRNONAA 50 (L) 07/13/2015 1056   GFRAA >60 12/06/2015 0602   GFRAA 57 (L) 07/13/2015 1056   Lipase     Component Value Date/Time   LIPASE 27 12/04/2015 1602   Studies/Results: Dg Chest 2 View  Result Date: 12/04/2015 CLINICAL DATA:  Chest pain in shortness of  breath today. EXAM: CHEST  2 VIEW COMPARISON:  10/10/2013 chest radiograph. 06/21/2013 CT abdomen and pelvis. FINDINGS: The cardiomediastinal silhouette is within normal limits. The lungs are well inflated and clear. There is no evidence of pleural effusion or pneumothorax. Abdominal aortic atherosclerosis is noted. There is a mild chronic L1 compression fracture. IMPRESSION: 1. No active cardiopulmonary disease. 2. Aortic atherosclerosis. Electronically Signed   By: Logan Bores M.D.   On: 12/04/2015 16:25   US Abdomen Limited Ruq  Result Date: 12/04/2015 CLINICAL DATA:  Acute onset of right upper quadrant abdominal pain. Initial encounter. EXAM: US ABDOMEN LIMITED - RIGHT UPPER QUADRANT COMPARISON:  CT of the abdomen and pelvis performed 06/21/2013 FINDINGS: Gallbladder: A 1.3 cm stone is noted about the base of the gallbladder, mobile but remaining near the neck. No gallbladder wall thickening or pericholecystic fluid is seen. No ultrasonographic Murphy's sign is elicited. Common bile duct: Diameter: 0.7 cm, within normal limits in caliber for the patient's age. Liver: No focal lesion identified. Within normal limits in parenchymal echogenicity. IMPRESSION: 1.3 cm stone noted about the base of the gallbladder, mobile but remaining near the neck. No definite evidence for obstruction or cholecystitis. Electronically Signed   By: Garald Balding M.D.   On: 12/04/2015 21:15   Anti-infectives: Anti-infectives  Start     Dose/Rate Route Frequency Ordered Stop   12/06/15 0600  ceFAZolin (ANCEF) IVPB 2g/100 mL premix     2 g 200 mL/hr over 30 Minutes Intravenous To Surgery 12/05/15 0841 12/07/15 0600   12/05/15 0045  [MAR Hold]  piperacillin-tazobactam (ZOSYN) IVPB 3.375 g     (MAR Hold since 12/06/15 1039)   3.375 g 12.5 mL/hr over 240 Minutes Intravenous Every 8 hours 12/05/15 0037       Assessment/Plan symptomatic cholecystitis Leukocytosis - NPO, IVF, Zosyn - consent obsained  Proceed to OR  today for laparoscopic cholecystectomy and possible IOC    LOS: 0 days    Jill Alexanders , [facility] Surgery 12/06/2015, 10:56 AM Pager: [phone] Consults: [phone] Mon-Fri 7:00 am-4:30 pm Sat-Sun 7:00 am-11:30 am

## 2015-12-06 NOTE — Anesthesia Procedure Notes (Signed)
Procedure Name: Intubation Date/Time: 12/06/2015 12:02 PM Performed by: Melina Copa, Stellar Gensel R Pre-anesthesia Checklist: Patient identified, Emergency Drugs available, Suction available and Patient being monitored Patient Re-evaluated:Patient Re-evaluated prior to inductionOxygen Delivery Method: Circle System Utilized Preoxygenation: Pre-oxygenation with 100% oxygen Intubation Type: IV induction Ventilation: Mask ventilation without difficulty Laryngoscope Size: Mac and 3 Grade View: Grade I Tube type: Oral Number of attempts: 1 Airway Equipment and Method: Stylet Placement Confirmation: ETT inserted through vocal cords under direct vision,  positive ETCO2 and breath sounds checked- equal and bilateral Tube secured with: Tape Dental Injury: Teeth and Oropharynx as per pre-operative assessment

## 2015-12-07 ENCOUNTER — Encounter (HOSPITAL_COMMUNITY): Payer: Self-pay | Admitting: General Surgery

## 2015-12-07 DIAGNOSIS — Z7902 Long term (current) use of antithrombotics/antiplatelets: Secondary | ICD-10-CM | POA: Diagnosis not present

## 2015-12-07 DIAGNOSIS — E785 Hyperlipidemia, unspecified: Secondary | ICD-10-CM | POA: Diagnosis present

## 2015-12-07 DIAGNOSIS — G43909 Migraine, unspecified, not intractable, without status migrainosus: Secondary | ICD-10-CM | POA: Diagnosis present

## 2015-12-07 DIAGNOSIS — E876 Hypokalemia: Secondary | ICD-10-CM | POA: Diagnosis present

## 2015-12-07 DIAGNOSIS — R1011 Right upper quadrant pain: Secondary | ICD-10-CM | POA: Diagnosis present

## 2015-12-07 DIAGNOSIS — Z91013 Allergy to seafood: Secondary | ICD-10-CM | POA: Diagnosis not present

## 2015-12-07 DIAGNOSIS — Z87891 Personal history of nicotine dependence: Secondary | ICD-10-CM | POA: Diagnosis not present

## 2015-12-07 DIAGNOSIS — Z8673 Personal history of transient ischemic attack (TIA), and cerebral infarction without residual deficits: Secondary | ICD-10-CM | POA: Diagnosis not present

## 2015-12-07 DIAGNOSIS — M5481 Occipital neuralgia: Secondary | ICD-10-CM | POA: Diagnosis present

## 2015-12-07 DIAGNOSIS — K219 Gastro-esophageal reflux disease without esophagitis: Secondary | ICD-10-CM | POA: Diagnosis present

## 2015-12-07 DIAGNOSIS — K8000 Calculus of gallbladder with acute cholecystitis without obstruction: Secondary | ICD-10-CM | POA: Diagnosis present

## 2015-12-07 LAB — CBC
HCT: 34.1 % — ABNORMAL LOW (ref 36.0–46.0)
Hemoglobin: 11.1 g/dL — ABNORMAL LOW (ref 12.0–15.0)
MCH: 29 pg (ref 26.0–34.0)
MCHC: 32.6 g/dL (ref 30.0–36.0)
MCV: 89 fL (ref 78.0–100.0)
Platelets: 172 10*3/uL (ref 150–400)
RBC: 3.83 MIL/uL — ABNORMAL LOW (ref 3.87–5.11)
RDW: 14.5 % (ref 11.5–15.5)
WBC: 8.6 10*3/uL (ref 4.0–10.5)

## 2015-12-07 LAB — COMPREHENSIVE METABOLIC PANEL
ALT: 96 U/L — ABNORMAL HIGH (ref 14–54)
AST: 85 U/L — ABNORMAL HIGH (ref 15–41)
Albumin: 2.7 g/dL — ABNORMAL LOW (ref 3.5–5.0)
Alkaline Phosphatase: 99 U/L (ref 38–126)
Anion gap: 4 — ABNORMAL LOW (ref 5–15)
BUN: <5 mg/dL — ABNORMAL LOW (ref 6–20)
CO2: 26 mmol/L (ref 22–32)
Calcium: 7.8 mg/dL — ABNORMAL LOW (ref 8.9–10.3)
Chloride: 110 mmol/L (ref 101–111)
Creatinine, Ser: 0.94 mg/dL (ref 0.44–1.00)
GFR calc Af Amer: >60 mL/min (ref >60)
GFR calc non Af Amer: >60 mL/min (ref >60)
Glucose, Bld: 115 mg/dL — ABNORMAL HIGH (ref 65–99)
Potassium: 2.5 mmol/L — CL (ref 3.5–5.1)
Sodium: 140 mmol/L (ref 135–145)
Total Bilirubin: 1 mg/dL (ref 0.3–1.2)
Total Protein: 5.4 g/dL — ABNORMAL LOW (ref 6.5–8.1)

## 2015-12-07 MED ORDER — POTASSIUM CHLORIDE 10 MEQ/100ML IV SOLN
10.0000 meq | INTRAVENOUS | Status: AC
  Administered 2015-12-08 (×3): 10 meq via INTRAVENOUS
  Filled 2015-12-07 (×2): qty 100

## 2015-12-07 MED ORDER — POTASSIUM CHLORIDE 20 MEQ PO PACK
20.0000 meq | PACK | Freq: Two times a day (BID) | ORAL | Status: AC
  Administered 2015-12-07 (×2): 20 meq via ORAL
  Filled 2015-12-07 (×2): qty 1

## 2015-12-07 MED ORDER — ALUM & MAG HYDROXIDE-SIMETH 200-200-20 MG/5ML PO SUSP
15.0000 mL | ORAL | Status: DC | PRN
  Administered 2015-12-07: 15 mL via ORAL
  Filled 2015-12-07: qty 30

## 2015-12-07 MED ORDER — POTASSIUM CHLORIDE 10 MEQ/100ML IV SOLN
10.0000 meq | INTRAVENOUS | Status: AC
  Administered 2015-12-07 (×3): 10 meq via INTRAVENOUS
  Filled 2015-12-07 (×3): qty 100

## 2015-12-07 NOTE — Progress Notes (Signed)
1 Day Post-Op  Subjective: Reports that she is in pain and that she was nauseated after morning meal (clears). Has been from bed to bedside commode, but has not ambulated yet. Passing urine and flatus without issue. No BM.   Objective: Vital signs in last 24 hours: Temp:  [98.4 F (36.9 C)-99.7 F (37.6 C)] 99.7 F (37.6 C) (09/29 0615) Pulse Rate:  [84-109] 96 (09/29 0615) Resp:  [11-19] 18 (09/29 0615) BP: (138-185)/(59-98) 161/79 (09/29 0615) SpO2:  [65 %-100 %] 100 % (09/29 0615) Last BM Date: 12/04/15  Intake/Output from previous day: 09/28 0701 - 09/29 0700 In: 4586.8 [I.V.:4436.8; IV Piggyback:150] Out: 1340 [Urine:1300; Blood:40] Intake/Output this shift: Total I/O In: -  Out: 750 [Urine:750]  PE: Gen: awake, alert, pleasant ENT: oral mucous membranes dry Pulm: CTA B/L CV: RRR Abd: soft with expected incisional tenderness. Port incisions x 3 clean, dry and intact with wound adhesive. Mild distention with hypoactive BS  Lab Results:   Recent Labs  12/05/15 0406 12/06/15 0602  WBC 11.7* 7.7  HGB 13.0 11.6*  HCT 39.9 36.2  PLT 198 164   BMET  Recent Labs  12/05/15 0406 12/06/15 0602  NA 141 141  K 3.8 3.0*  CL 107 112*  CO2 25 25  GLUCOSE 126* 109*  BUN 13 5*  CREATININE 0.96 1.07*  CALCIUM 8.2* 7.7*   PT/INR  Recent Labs  12/05/15 0406  LABPROT 13.5  INR 1.03   CMP     Component Value Date/Time   NA 141 12/06/2015 0602   K 3.0 (L) 12/06/2015 0602   CL 112 (H) 12/06/2015 0602   CO2 25 12/06/2015 0602   GLUCOSE 109 (H) 12/06/2015 0602   BUN 5 (L) 12/06/2015 0602   CREATININE 1.07 (H) 12/06/2015 0602   CREATININE 1.17 (H) 07/13/2015 1056   CALCIUM 7.7 (L) 12/06/2015 0602   PROT 5.3 (L) 12/06/2015 0602   ALBUMIN 3.1 (L) 12/06/2015 0602   AST 99 (H) 12/06/2015 0602   ALT 100 (H) 12/06/2015 0602   ALKPHOS 86 12/06/2015 0602   BILITOT 1.2 12/06/2015 0602   GFRNONAA 54 (L) 12/06/2015 0602   GFRNONAA 50 (L) 07/13/2015 1056   GFRAA >60  12/06/2015 0602   GFRAA 57 (L) 07/13/2015 1056   Lipase     Component Value Date/Time   LIPASE 27 12/04/2015 1602       Studies/Results: Dg Cholangiogram Operative  Result Date: 12/06/2015 CLINICAL DATA:  Gallstones EXAM: INTRAOPERATIVE CHOLANGIOGRAM TECHNIQUE: Cholangiographic images from the C-arm fluoroscopic device were submitted for interpretation post-operatively. Please see the procedural report for the amount of contrast and the fluoroscopy time utilized. COMPARISON:  None. FINDINGS: Contrast fills the biliary tree and duodenum without filling defects in the common bile duct. There is some narrowing of the distal common bile duct of unknown significance. IMPRESSION: Patent biliary tree. Electronically Signed   By: Marybelle Killings M.D.   On: 12/06/2015 13:12    Anti-infectives: Anti-infectives    Start     Dose/Rate Route Frequency Ordered Stop   12/06/15 0600  ceFAZolin (ANCEF) IVPB 2g/100 mL premix     2 g 200 mL/hr over 30 Minutes Intravenous To Surgery 12/05/15 0841 12/06/15 1150   12/05/15 0045  piperacillin-tazobactam (ZOSYN) IVPB 3.375 g     3.375 g 12.5 mL/hr over 240 Minutes Intravenous Every 8 hours 12/05/15 0037         Assessment/Plan  POD #1 s/p laparoscopic cholecystectomy with IOC. Doing well. Expected postoperative discomfort well  controlled with current medications. Needs to begin to ambulate.  Diet: Clears with advance to fulls this evening. Continue IVF until oral intake improves and nausea improves.  DVT/PE prophylaxis: Lovenox, SCDs, ambulation ID: on Zosyn. Will discuss length of this therapy with Dr. Barry Dienes. Afebrile. Heme: on Eliquis prior to surgery. Will repeat CBC today to check H/H.  Hypokalemia: K+3.0, Written for oral potassium replacement for today. Repeat CMET today.  Disp: likely discharge home tomorrow.      LOS: 0 days    LEE PRESSON, [facility] Surgery 12/07/2015, 11:59 AM Pager:[phone]

## 2015-12-07 NOTE — Progress Notes (Signed)
CRITICAL VALUE ALERT  Critical value received:  K+ 2.5  Date of notification:  12/07/2015  Time of notification:  1440  Critical value read back:yes  Nurse who received alert:  Ludwig Clarks  MD notified (1st page):Michael Jacqulynn Cadet

## 2015-12-08 LAB — BASIC METABOLIC PANEL
Anion gap: 5 (ref 5–15)
BUN: <5 mg/dL — ABNORMAL LOW (ref 6–20)
CO2: 23 mmol/L (ref 22–32)
Calcium: 7.8 mg/dL — ABNORMAL LOW (ref 8.9–10.3)
Chloride: 111 mmol/L (ref 101–111)
Creatinine, Ser: 0.77 mg/dL (ref 0.44–1.00)
GFR calc Af Amer: >60 mL/min (ref >60)
GFR calc non Af Amer: >60 mL/min (ref >60)
Glucose, Bld: 113 mg/dL — ABNORMAL HIGH (ref 65–99)
Potassium: 3.5 mmol/L (ref 3.5–5.1)
Sodium: 139 mmol/L (ref 135–145)

## 2015-12-08 LAB — CBC
HCT: 32.6 % — ABNORMAL LOW (ref 36.0–46.0)
Hemoglobin: 10.7 g/dL — ABNORMAL LOW (ref 12.0–15.0)
MCH: 29.3 pg (ref 26.0–34.0)
MCHC: 32.8 g/dL (ref 30.0–36.0)
MCV: 89.3 fL (ref 78.0–100.0)
Platelets: 184 10*3/uL (ref 150–400)
RBC: 3.65 MIL/uL — ABNORMAL LOW (ref 3.87–5.11)
RDW: 14.4 % (ref 11.5–15.5)
WBC: 8.5 10*3/uL (ref 4.0–10.5)

## 2015-12-08 MED ORDER — ONDANSETRON 4 MG PO TBDP: 4.0000 mg | ORAL_TABLET | Freq: Four times a day (QID) | ORAL | 0 refills | Status: DC | PRN

## 2015-12-08 MED ORDER — POTASSIUM CHLORIDE 10 MEQ/100ML IV SOLN
INTRAVENOUS | Status: AC
  Filled 2015-12-08: qty 100

## 2015-12-08 MED ORDER — OXYCODONE HCL 5 MG PO TABS: 5.0000 mg | ORAL_TABLET | ORAL | 0 refills | Status: DC | PRN

## 2015-12-08 NOTE — Discharge Summary (Signed)
[name] Discharge Summary   Patient ID: [name] MRN: [id number] DOB/AGE: Aug 01, 1950 65 y.o.  Admit date: 12/04/2015 Discharge date: 12/08/2015  Admitting Diagnosis: Cholelithiasis  Discharge Diagnosis Patient Active Problem List   Diagnosis Date Noted  . Symptomatic cholelithiasis 12/05/2015  . HLD (hyperlipidemia) 04/02/2015  . Chronic migraine without aura, with status migrainosus 09/26/2014  . Dizziness 11/09/2013  . Adjustment disorder with mixed anxiety and depressed mood 11/03/2013  . Migraine variant 10/10/2013  . Disturbance of skin sensation 10/10/2013  . TIA (transient ischemic attack) 10/10/2013  . HTN (hypertension) 07/18/2013  . 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 Neuralgia, neuritis, and radiculitis, unspecified 02/04/2012  . 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 Myofascial muscle pain 07/15/2011    Consultants None  Imaging: Dg Cholangiogram Operative  Result Date: 12/06/2015 CLINICAL DATA:  Gallstones EXAM: INTRAOPERATIVE CHOLANGIOGRAM TECHNIQUE: Cholangiographic images from the C-arm fluoroscopic device were submitted for interpretation post-operatively. Please see the procedural report for the amount of contrast and the fluoroscopy time utilized. COMPARISON:  None. FINDINGS: Contrast fills the biliary tree and duodenum without filling defects in the common bile duct. There is some narrowing of the distal common bile duct of unknown significance. IMPRESSION: Patent biliary tree. Electronically Signed   By: [name] M.D.   On: 12/06/2015 13:12    Procedures Dr. Barry [name] (12/06/15) - Laparoscopic Cholecystectomy with [place] [facility] Course:  [name]  Is a 65yo female who presented to [facility] 12/04/15 with acute onset abdominal pain, nausea, and vomiting.  Workup showed symptomatic cholelithiasis.  Patient was admitted and underwent  procedure listed above.  Tolerated procedure well and was transferred to the floor.   Postoperatively her potassium was a little low but it was repleted. On POD2 the patient was feeling much better, her nausea had resolved, she was voiding well, tolerating diet, ambulating well, pain well controlled, vital signs stable, incisions c/d/i and felt stable for discharge home.  Patient will follow up in our office in 3 weeks and knows to call with questions or concerns.  She will call to confirm appointment date/time.    Physical Exam: General:  Alert, NAD, pleasant, comfortable Abd:  Soft, ND, appropriately tender, incisions C/D/I, +BS    Medication List    TAKE these medications   atorvastatin 40 MG tablet Commonly known as:  LIPITOR Take 1 tablet (40 mg total) by mouth daily.   clopidogrel 75 MG tablet Commonly known as:  PLAVIX TAKE 1 TABLET BY MOUTH EVERY DAY   doxepin 100 MG capsule Commonly known as:  SINEQUAN Take 2 capsules (200 mg total) by mouth at bedtime.   furosemide 20 MG tablet Commonly known as:  LASIX Take 10 mg by mouth daily.   gabapentin 600 MG tablet Commonly known as:  NEURONTIN Take 1 tablet (600 mg total) by mouth 2 (two) times daily.   lidocaine 5 % Commonly known as:  LIDODERM Place 2 patches onto the skin daily. Remove & Discard patch within 12 hours or as directed by MD   meclizine 25 MG tablet Commonly known as:  ANTIVERT Take 1 tablet (25 mg total) by mouth 2 (two) times daily as needed for dizziness.   ondansetron 4 MG disintegrating tablet Commonly known as:  ZOFRAN-ODT Take 1 tablet (4 mg total) by mouth every 6 (six) hours as needed for nausea.   oxyCODONE 5 MG immediate release tablet Commonly known as:  Oxy IR/ROXICODONE Take 1-2 tablets (5-10 mg total) by mouth every 4 (four) hours  as needed for moderate pain.   ramipril 2.5 MG capsule Commonly known as:  ALTACE TAKE ONE CAPSULE BY MOUTH EVERY DAY   sertraline 50 MG tablet Commonly known as:  ZOLOFT Take 1 tablet (50 mg total) by mouth 2 (two) times daily.    tiZANidine 2 MG tablet Commonly known as:  ZANAFLEX Take 1 tablet (2 mg total) by mouth 3 (three) times daily.   topiramate 100 MG tablet Commonly known as:  TOPAMAX TAKE 1 TABLET TWICE A DAY        Follow-up Information    [name],FAERA, MD. Call today.   Specialty:  General [name] Why:  Please call as soon as you leave the hospital to make an appointment with Dr. Barry [name] in about 3 weeks. Contact information: [address] [place] [address] [phone]           Signed: [name], [name] [name] 12/08/2015, 8:02 AM Pager: [phone] Consults: [phone] Mon-Fri 7:00 am-4:30 pm Sat-Sun 7:00 am-11:30 am

## 2015-12-08 NOTE — Discharge Instructions (Signed)

## 2015-12-08 NOTE — Progress Notes (Addendum)
Discharge instructions gone over with patient. Home medications discussed.  Prescriptions given. Diet, activity, and incisional care discussed. Signs and symptoms of infection gone over. Follow up appointment to be made. Reasons to call the doctor discussed.

## 2015-12-10 ENCOUNTER — Encounter (HOSPITAL_COMMUNITY): Payer: Self-pay

## 2015-12-10 ENCOUNTER — Ambulatory Visit: Payer: Self-pay | Admitting: Family Medicine

## 2015-12-10 ENCOUNTER — Emergency Department (HOSPITAL_COMMUNITY): Payer: BLUE CROSS/BLUE SHIELD

## 2015-12-10 DIAGNOSIS — Z8673 Personal history of transient ischemic attack (TIA), and cerebral infarction without residual deficits: Secondary | ICD-10-CM

## 2015-12-10 DIAGNOSIS — G43909 Migraine, unspecified, not intractable, without status migrainosus: Secondary | ICD-10-CM | POA: Diagnosis present

## 2015-12-10 DIAGNOSIS — Z79899 Other long term (current) drug therapy: Secondary | ICD-10-CM

## 2015-12-10 DIAGNOSIS — K859 Acute pancreatitis without necrosis or infection, unspecified: Secondary | ICD-10-CM | POA: Diagnosis not present

## 2015-12-10 DIAGNOSIS — I2510 Atherosclerotic heart disease of native coronary artery without angina pectoris: Secondary | ICD-10-CM | POA: Diagnosis present

## 2015-12-10 DIAGNOSIS — K9189 Other postprocedural complications and disorders of digestive system: Secondary | ICD-10-CM | POA: Diagnosis present

## 2015-12-10 DIAGNOSIS — Z801 Family history of malignant neoplasm of trachea, bronchus and lung: Secondary | ICD-10-CM

## 2015-12-10 DIAGNOSIS — N183 Chronic kidney disease, stage 3 (moderate): Secondary | ICD-10-CM | POA: Diagnosis present

## 2015-12-10 DIAGNOSIS — Z981 Arthrodesis status: Secondary | ICD-10-CM

## 2015-12-10 DIAGNOSIS — E876 Hypokalemia: Secondary | ICD-10-CM | POA: Diagnosis present

## 2015-12-10 DIAGNOSIS — R630 Anorexia: Secondary | ICD-10-CM | POA: Diagnosis present

## 2015-12-10 DIAGNOSIS — Z7902 Long term (current) use of antithrombotics/antiplatelets: Secondary | ICD-10-CM

## 2015-12-10 DIAGNOSIS — I700 Atherosclerosis of aorta: Secondary | ICD-10-CM | POA: Diagnosis present

## 2015-12-10 DIAGNOSIS — A408 Other streptococcal sepsis: Secondary | ICD-10-CM | POA: Diagnosis present

## 2015-12-10 DIAGNOSIS — T814XXA Infection following a procedure, initial encounter: Principal | ICD-10-CM | POA: Diagnosis present

## 2015-12-10 DIAGNOSIS — K219 Gastro-esophageal reflux disease without esophagitis: Secondary | ICD-10-CM | POA: Diagnosis present

## 2015-12-10 DIAGNOSIS — Z87891 Personal history of nicotine dependence: Secondary | ICD-10-CM

## 2015-12-10 DIAGNOSIS — E785 Hyperlipidemia, unspecified: Secondary | ICD-10-CM | POA: Diagnosis present

## 2015-12-10 DIAGNOSIS — T8189XA Other complications of procedures, not elsewhere classified, initial encounter: Secondary | ICD-10-CM | POA: Diagnosis not present

## 2015-12-10 DIAGNOSIS — Y838 Other surgical procedures as the cause of abnormal reaction of the patient, or of later complication, without mention of misadventure at the time of the procedure: Secondary | ICD-10-CM | POA: Diagnosis present

## 2015-12-10 DIAGNOSIS — M25511 Pain in right shoulder: Secondary | ICD-10-CM | POA: Diagnosis not present

## 2015-12-10 DIAGNOSIS — Z91013 Allergy to seafood: Secondary | ICD-10-CM

## 2015-12-10 DIAGNOSIS — Z79891 Long term (current) use of opiate analgesic: Secondary | ICD-10-CM

## 2015-12-10 DIAGNOSIS — I129 Hypertensive chronic kidney disease with stage 1 through stage 4 chronic kidney disease, or unspecified chronic kidney disease: Secondary | ICD-10-CM | POA: Diagnosis present

## 2015-12-10 DIAGNOSIS — I080 Rheumatic disorders of both mitral and aortic valves: Secondary | ICD-10-CM | POA: Diagnosis present

## 2015-12-10 DIAGNOSIS — E872 Acidosis: Secondary | ICD-10-CM | POA: Diagnosis present

## 2015-12-10 DIAGNOSIS — D689 Coagulation defect, unspecified: Secondary | ICD-10-CM | POA: Diagnosis not present

## 2015-12-10 DIAGNOSIS — K831 Obstruction of bile duct: Secondary | ICD-10-CM | POA: Diagnosis present

## 2015-12-10 DIAGNOSIS — R600 Localized edema: Secondary | ICD-10-CM | POA: Diagnosis not present

## 2015-12-10 DIAGNOSIS — E871 Hypo-osmolality and hyponatremia: Secondary | ICD-10-CM | POA: Diagnosis present

## 2015-12-10 DIAGNOSIS — Z820 Family history of epilepsy and other diseases of the nervous system: Secondary | ICD-10-CM

## 2015-12-10 LAB — URINALYSIS, ROUTINE W REFLEX MICROSCOPIC
Bilirubin Urine: NEGATIVE
Glucose, UA: NEGATIVE mg/dL
Hgb urine dipstick: NEGATIVE
Ketones, ur: NEGATIVE mg/dL
Leukocytes, UA: NEGATIVE
Nitrite: NEGATIVE
Protein, ur: NEGATIVE mg/dL
Specific Gravity, Urine: 1.004 — ABNORMAL LOW (ref 1.005–1.030)
pH: 6 (ref 5.0–8.0)

## 2015-12-10 LAB — CBC WITH DIFFERENTIAL/PLATELET
Basophils Absolute: 0 10*3/uL (ref 0.0–0.1)
Basophils Relative: 0 %
Eosinophils Absolute: 0.1 10*3/uL (ref 0.0–0.7)
Eosinophils Relative: 0 %
HCT: 34.9 % — ABNORMAL LOW (ref 36.0–46.0)
Hemoglobin: 11.4 g/dL — ABNORMAL LOW (ref 12.0–15.0)
Lymphocytes Relative: 9 %
Lymphs Abs: 1 10*3/uL (ref 0.7–4.0)
MCH: 29.2 pg (ref 26.0–34.0)
MCHC: 32.7 g/dL (ref 30.0–36.0)
MCV: 89.3 fL (ref 78.0–100.0)
Monocytes Absolute: 0.9 10*3/uL (ref 0.1–1.0)
Monocytes Relative: 8 %
Neutro Abs: 9.1 10*3/uL — ABNORMAL HIGH (ref 1.7–7.7)
Neutrophils Relative %: 83 %
Platelets: 320 10*3/uL (ref 150–400)
RBC: 3.91 MIL/uL (ref 3.87–5.11)
RDW: 14.4 % (ref 11.5–15.5)
WBC: 11.2 10*3/uL — ABNORMAL HIGH (ref 4.0–10.5)

## 2015-12-10 LAB — COMPREHENSIVE METABOLIC PANEL
ALT: 45 U/L (ref 14–54)
AST: 34 U/L (ref 15–41)
Albumin: 2.9 g/dL — ABNORMAL LOW (ref 3.5–5.0)
Alkaline Phosphatase: 147 U/L — ABNORMAL HIGH (ref 38–126)
Anion gap: 10 (ref 5–15)
BUN: 6 mg/dL (ref 6–20)
CO2: 18 mmol/L — ABNORMAL LOW (ref 22–32)
Calcium: 8.2 mg/dL — ABNORMAL LOW (ref 8.9–10.3)
Chloride: 105 mmol/L (ref 101–111)
Creatinine, Ser: 0.87 mg/dL (ref 0.44–1.00)
GFR calc Af Amer: >60 mL/min (ref >60)
GFR calc non Af Amer: >60 mL/min (ref >60)
Glucose, Bld: 111 mg/dL — ABNORMAL HIGH (ref 65–99)
Potassium: 3.2 mmol/L — ABNORMAL LOW (ref 3.5–5.1)
Sodium: 133 mmol/L — ABNORMAL LOW (ref 135–145)
Total Bilirubin: 0.9 mg/dL (ref 0.3–1.2)
Total Protein: 6.5 g/dL (ref 6.5–8.1)

## 2015-12-10 LAB — I-STAT BETA HCG BLOOD, ED (MC, WL, AP ONLY): I-stat hCG, quantitative: <5 m[IU]/mL (ref <5)

## 2015-12-10 LAB — I-STAT CG4 LACTIC ACID, ED: Lactic Acid, Venous: 0.7 mmol/L (ref 0.5–1.9)

## 2015-12-10 NOTE — ED Triage Notes (Signed)
Pt. Had gallbladder surgery last Wednesday, went home on Saturday was feeling well and then developed fever, cough on Sunday.  Pt. 's son stated that she appears to be having periods of confusion with the fever.  She also has not moved her bowels since discharge.    She is voiding without any difficulty.  Skin is pink, warm and dry.    Alert and  Oriented X 4.

## 2015-12-11 ENCOUNTER — Inpatient Hospital Stay (HOSPITAL_COMMUNITY): Payer: BLUE CROSS/BLUE SHIELD

## 2015-12-11 ENCOUNTER — Emergency Department (HOSPITAL_COMMUNITY): Payer: BLUE CROSS/BLUE SHIELD

## 2015-12-11 ENCOUNTER — Encounter (HOSPITAL_COMMUNITY): Payer: Self-pay | Admitting: General Surgery

## 2015-12-11 ENCOUNTER — Inpatient Hospital Stay (HOSPITAL_COMMUNITY)
Admission: EM | Admit: 2015-12-11 | Discharge: 2015-12-28 | DRG: 862 | Disposition: A | Discharge status: Home Health | Payer: BLUE CROSS/BLUE SHIELD | Attending: Surgery | Admitting: Surgery

## 2015-12-11 ENCOUNTER — Ambulatory Visit: Payer: Self-pay | Admitting: Internal Medicine

## 2015-12-11 DIAGNOSIS — K9189 Other postprocedural complications and disorders of digestive system: Secondary | ICD-10-CM | POA: Diagnosis present

## 2015-12-11 DIAGNOSIS — Z8673 Personal history of transient ischemic attack (TIA), and cerebral infarction without residual deficits: Secondary | ICD-10-CM | POA: Diagnosis not present

## 2015-12-11 DIAGNOSIS — Z91013 Allergy to seafood: Secondary | ICD-10-CM | POA: Diagnosis not present

## 2015-12-11 DIAGNOSIS — T8189XA Other complications of procedures, not elsewhere classified, initial encounter: Secondary | ICD-10-CM | POA: Diagnosis present

## 2015-12-11 DIAGNOSIS — A491 Streptococcal infection, unspecified site: Secondary | ICD-10-CM

## 2015-12-11 DIAGNOSIS — D689 Coagulation defect, unspecified: Secondary | ICD-10-CM | POA: Diagnosis not present

## 2015-12-11 DIAGNOSIS — K839 Disease of biliary tract, unspecified: Secondary | ICD-10-CM

## 2015-12-11 DIAGNOSIS — A408 Other streptococcal sepsis: Secondary | ICD-10-CM | POA: Diagnosis present

## 2015-12-11 DIAGNOSIS — R7881 Bacteremia: Secondary | ICD-10-CM

## 2015-12-11 DIAGNOSIS — I2510 Atherosclerotic heart disease of native coronary artery without angina pectoris: Secondary | ICD-10-CM | POA: Diagnosis present

## 2015-12-11 DIAGNOSIS — E872 Acidosis: Secondary | ICD-10-CM | POA: Diagnosis present

## 2015-12-11 DIAGNOSIS — K838 Other specified diseases of biliary tract: Secondary | ICD-10-CM | POA: Diagnosis not present

## 2015-12-11 DIAGNOSIS — J90 Pleural effusion, not elsewhere classified: Secondary | ICD-10-CM

## 2015-12-11 DIAGNOSIS — T8189XS Other complications of procedures, not elsewhere classified, sequela: Secondary | ICD-10-CM | POA: Diagnosis not present

## 2015-12-11 DIAGNOSIS — E785 Hyperlipidemia, unspecified: Secondary | ICD-10-CM | POA: Diagnosis present

## 2015-12-11 DIAGNOSIS — R509 Fever, unspecified: Secondary | ICD-10-CM | POA: Diagnosis not present

## 2015-12-11 DIAGNOSIS — B954 Other streptococcus as the cause of diseases classified elsewhere: Secondary | ICD-10-CM | POA: Diagnosis not present

## 2015-12-11 DIAGNOSIS — Z9049 Acquired absence of other specified parts of digestive tract: Secondary | ICD-10-CM | POA: Diagnosis not present

## 2015-12-11 DIAGNOSIS — I340 Nonrheumatic mitral (valve) insufficiency: Secondary | ICD-10-CM | POA: Diagnosis not present

## 2015-12-11 DIAGNOSIS — I700 Atherosclerosis of aorta: Secondary | ICD-10-CM | POA: Diagnosis present

## 2015-12-11 DIAGNOSIS — T814XXA Infection following a procedure, initial encounter: Secondary | ICD-10-CM | POA: Diagnosis present

## 2015-12-11 DIAGNOSIS — K831 Obstruction of bile duct: Secondary | ICD-10-CM | POA: Diagnosis present

## 2015-12-11 DIAGNOSIS — E876 Hypokalemia: Secondary | ICD-10-CM | POA: Diagnosis present

## 2015-12-11 DIAGNOSIS — Z801 Family history of malignant neoplasm of trachea, bronchus and lung: Secondary | ICD-10-CM | POA: Diagnosis not present

## 2015-12-11 DIAGNOSIS — K219 Gastro-esophageal reflux disease without esophagitis: Secondary | ICD-10-CM | POA: Diagnosis present

## 2015-12-11 DIAGNOSIS — Z7902 Long term (current) use of antithrombotics/antiplatelets: Secondary | ICD-10-CM | POA: Diagnosis not present

## 2015-12-11 DIAGNOSIS — I129 Hypertensive chronic kidney disease with stage 1 through stage 4 chronic kidney disease, or unspecified chronic kidney disease: Secondary | ICD-10-CM | POA: Diagnosis present

## 2015-12-11 DIAGNOSIS — G43909 Migraine, unspecified, not intractable, without status migrainosus: Secondary | ICD-10-CM | POA: Diagnosis present

## 2015-12-11 DIAGNOSIS — N183 Chronic kidney disease, stage 3 (moderate): Secondary | ICD-10-CM | POA: Diagnosis present

## 2015-12-11 DIAGNOSIS — K668 Other specified disorders of peritoneum: Secondary | ICD-10-CM

## 2015-12-11 DIAGNOSIS — Z9889 Other specified postprocedural states: Secondary | ICD-10-CM

## 2015-12-11 DIAGNOSIS — K859 Acute pancreatitis without necrosis or infection, unspecified: Secondary | ICD-10-CM | POA: Diagnosis not present

## 2015-12-11 DIAGNOSIS — E871 Hypo-osmolality and hyponatremia: Secondary | ICD-10-CM | POA: Diagnosis present

## 2015-12-11 DIAGNOSIS — K651 Peritoneal abscess: Secondary | ICD-10-CM | POA: Diagnosis not present

## 2015-12-11 DIAGNOSIS — Z820 Family history of epilepsy and other diseases of the nervous system: Secondary | ICD-10-CM | POA: Diagnosis not present

## 2015-12-11 DIAGNOSIS — Y838 Other surgical procedures as the cause of abnormal reaction of the patient, or of later complication, without mention of misadventure at the time of the procedure: Secondary | ICD-10-CM | POA: Diagnosis present

## 2015-12-11 DIAGNOSIS — Z87891 Personal history of nicotine dependence: Secondary | ICD-10-CM | POA: Diagnosis not present

## 2015-12-11 DIAGNOSIS — I080 Rheumatic disorders of both mitral and aortic valves: Secondary | ICD-10-CM | POA: Diagnosis present

## 2015-12-11 DIAGNOSIS — A419 Sepsis, unspecified organism: Secondary | ICD-10-CM | POA: Diagnosis present

## 2015-12-11 DIAGNOSIS — Z981 Arthrodesis status: Secondary | ICD-10-CM | POA: Diagnosis not present

## 2015-12-11 DIAGNOSIS — Z113 Encounter for screening for infections with a predominantly sexual mode of transmission: Secondary | ICD-10-CM

## 2015-12-11 HISTORY — DX: Disorder of kidney and ureter, unspecified: N28.9

## 2015-12-11 LAB — BLOOD CULTURE ID PANEL (REFLEXED)

## 2015-12-11 LAB — I-STAT TROPONIN, ED: Troponin i, poc: 0 ng/mL (ref 0.00–0.08)

## 2015-12-11 LAB — MRSA PCR SCREENING: MRSA by PCR: NEGATIVE

## 2015-12-11 LAB — PROTIME-INR
INR: 1.1
Prothrombin Time: 14.3 seconds (ref 11.4–15.2)

## 2015-12-11 MED ORDER — IOPAMIDOL (ISOVUE-300) INJECTION 61%
INTRAVENOUS | Status: AC
  Filled 2015-12-11: qty 30

## 2015-12-11 MED ORDER — LIDOCAINE HCL 1 % IJ SOLN
INTRAMUSCULAR | Status: AC
  Filled 2015-12-11: qty 20

## 2015-12-11 MED ORDER — SODIUM CHLORIDE 0.9 % IV SOLN
INTRAVENOUS | Status: DC
  Administered 2015-12-11 – 2015-12-13 (×4): via INTRAVENOUS

## 2015-12-11 MED ORDER — ACETAMINOPHEN 650 MG RE SUPP: 650.0000 mg | Freq: Four times a day (QID) | RECTAL | Status: DC | PRN

## 2015-12-11 MED ORDER — FENTANYL CITRATE (PF) 100 MCG/2ML IJ SOLN
INTRAMUSCULAR | Status: AC
  Filled 2015-12-11: qty 2

## 2015-12-11 MED ORDER — METOCLOPRAMIDE HCL 5 MG/ML IJ SOLN
10.0000 mg | Freq: Once | INTRAMUSCULAR | Status: AC
  Administered 2015-12-11: 10 mg via INTRAVENOUS
  Filled 2015-12-11: qty 2

## 2015-12-11 MED ORDER — LACTATED RINGERS IV BOLUS (SEPSIS)
2000.0000 mL | Freq: Once | INTRAVENOUS | Status: AC
  Administered 2015-12-11: 2000 mL via INTRAVENOUS

## 2015-12-11 MED ORDER — ACETAMINOPHEN 325 MG PO TABS
650.0000 mg | ORAL_TABLET | Freq: Four times a day (QID) | ORAL | Status: DC | PRN
  Administered 2015-12-11 (×2): 650 mg via ORAL
  Filled 2015-12-11 (×3): qty 2

## 2015-12-11 MED ORDER — FAMOTIDINE 40 MG/5ML PO SUSR
20.0000 mg | Freq: Two times a day (BID) | ORAL | Status: DC
  Administered 2015-12-11 – 2015-12-20 (×19): 20 mg via ORAL
  Filled 2015-12-11 (×21): qty 2.5

## 2015-12-11 MED ORDER — ENOXAPARIN SODIUM 40 MG/0.4ML ~~LOC~~ SOLN
40.0000 mg | Freq: Every day | SUBCUTANEOUS | Status: DC
  Administered 2015-12-11 – 2015-12-12 (×2): 40 mg via SUBCUTANEOUS
  Filled 2015-12-11 (×2): qty 0.4

## 2015-12-11 MED ORDER — BISACODYL 10 MG RE SUPP: 10.0000 mg | Freq: Every day | RECTAL | Status: DC | PRN

## 2015-12-11 MED ORDER — PIPERACILLIN-TAZOBACTAM 3.375 G IVPB 30 MIN
3.3750 g | Freq: Once | INTRAVENOUS | Status: AC
  Administered 2015-12-11: 3.375 g via INTRAVENOUS
  Filled 2015-12-11: qty 50

## 2015-12-11 MED ORDER — ONDANSETRON HCL 4 MG/2ML IJ SOLN
4.0000 mg | Freq: Once | INTRAMUSCULAR | Status: AC
  Administered 2015-12-11: 4 mg via INTRAVENOUS
  Filled 2015-12-11: qty 2

## 2015-12-11 MED ORDER — ONDANSETRON HCL 4 MG/2ML IJ SOLN
4.0000 mg | Freq: Four times a day (QID) | INTRAMUSCULAR | Status: DC | PRN
  Administered 2015-12-12 – 2015-12-22 (×20): 4 mg via INTRAVENOUS
  Filled 2015-12-11 (×21): qty 2

## 2015-12-11 MED ORDER — IBUPROFEN 200 MG PO TABS: 600.0000 mg | ORAL_TABLET | Freq: Four times a day (QID) | ORAL | Status: DC | PRN

## 2015-12-11 MED ORDER — IOPAMIDOL (ISOVUE-300) INJECTION 61%
80.0000 mL | Freq: Once | INTRAVENOUS | Status: AC | PRN
  Administered 2015-12-11: 80 mL via INTRAVENOUS

## 2015-12-11 MED ORDER — ENOXAPARIN SODIUM 40 MG/0.4ML ~~LOC~~ SOLN: 40.0000 mg | Freq: Every day | SUBCUTANEOUS | Status: DC

## 2015-12-11 MED ORDER — IOPAMIDOL (ISOVUE-300) INJECTION 61%
INTRAVENOUS | Status: AC
  Filled 2015-12-11: qty 100

## 2015-12-11 MED ORDER — HYDROMORPHONE HCL 1 MG/ML IJ SOLN
0.2000 mg | INTRAMUSCULAR | Status: DC | PRN
  Administered 2015-12-12 – 2015-12-14 (×14): 0.2 mg via INTRAVENOUS
  Filled 2015-12-11 (×14): qty 1

## 2015-12-11 MED ORDER — MIDAZOLAM HCL 2 MG/2ML IJ SOLN
INTRAMUSCULAR | Status: AC
  Filled 2015-12-11: qty 2

## 2015-12-11 MED ORDER — ACETAMINOPHEN 650 MG RE SUPP
650.0000 mg | Freq: Once | RECTAL | Status: AC
  Administered 2015-12-11: 650 mg via RECTAL
  Filled 2015-12-11: qty 1

## 2015-12-11 MED ORDER — FENTANYL CITRATE (PF) 100 MCG/2ML IJ SOLN
INTRAMUSCULAR | Status: AC | PRN
  Administered 2015-12-11 (×2): 50 ug via INTRAVENOUS

## 2015-12-11 MED ORDER — PIPERACILLIN-TAZOBACTAM 3.375 G IVPB
3.3750 g | Freq: Three times a day (TID) | INTRAVENOUS | Status: DC
  Administered 2015-12-11 – 2015-12-14 (×9): 3.375 g via INTRAVENOUS
  Filled 2015-12-11 (×15): qty 50

## 2015-12-11 MED ORDER — HYDROMORPHONE HCL 1 MG/ML IJ SOLN
1.0000 mg | Freq: Once | INTRAMUSCULAR | Status: AC
Start: 2015-12-11 — End: 2015-12-11
  Administered 2015-12-11: 1 mg via INTRAVENOUS
  Filled 2015-12-11: qty 1

## 2015-12-11 MED ORDER — MIDAZOLAM HCL 2 MG/2ML IJ SOLN
INTRAMUSCULAR | Status: AC | PRN
  Administered 2015-12-11: 1 mg via INTRAVENOUS

## 2015-12-11 MED ORDER — TECHNETIUM TC 99M MEBROFENIN IV KIT: 5.0000 | PACK | Freq: Once | INTRAVENOUS | Status: DC | PRN

## 2015-12-11 MED ORDER — DOCUSATE SODIUM 100 MG PO CAPS
100.0000 mg | ORAL_CAPSULE | Freq: Two times a day (BID) | ORAL | Status: DC
  Administered 2015-12-11 – 2015-12-28 (×4): 100 mg via ORAL
  Filled 2015-12-11 (×20): qty 1

## 2015-12-11 MED ORDER — ONDANSETRON 4 MG PO TBDP
4.0000 mg | ORAL_TABLET | Freq: Four times a day (QID) | ORAL | Status: DC | PRN
  Filled 2015-12-11: qty 1

## 2015-12-11 MED ORDER — SENNA 8.6 MG PO TABS
1.0000 | ORAL_TABLET | Freq: Two times a day (BID) | ORAL | Status: DC
  Administered 2015-12-11 – 2015-12-28 (×3): 8.6 mg via ORAL
  Filled 2015-12-11 (×20): qty 1

## 2015-12-11 NOTE — ED Notes (Addendum)
Pt c/o "shallow" breaths and like a "ton of bricks" are on her chest. EDP aware.

## 2015-12-11 NOTE — Sedation Documentation (Signed)
290 cc of dark brown color liquid drained off, will place gravity drain

## 2015-12-11 NOTE — Progress Notes (Signed)
Patient transferred to IR for procedure.  No complications or s/s of distress.  Reported off to Gwyneth Sprout, RN

## 2015-12-11 NOTE — Care Management Note (Signed)
Case Management Note  Patient Details  Name: [name] MRN: [id number] Date of Birth: [birth year]  Subjective/Objective:  readmit  S/p lap chole.  Pt now c/o increase in fever from discharge, worsening confusion; large fluid collection with biloma - IR to place drain             Action/Plan:  PTA from home with husband.  CM will continue to monitor for discharge needs   Expected Discharge Date:                  Expected Discharge Plan:  Home/Self Care  In-House Referral:     Discharge planning Services  CM Consult  Post Acute Care Choice:    Choice offered to:     DME Arranged:    DME Agency:     HH Arranged:    HH Agency:     Status of Service:  In process, will continue to follow  If discussed at Long Length of Stay Meetings, dates discussed:    Additional Comments:  Maryclare Labrador, RN 12/11/2015, 3:52 PM

## 2015-12-11 NOTE — Sedation Documentation (Signed)
Patient is resting comfortably. 

## 2015-12-11 NOTE — Consult Note (Signed)
[facility] Consult: 3:32 PM 12/11/2015  LOS: 0 days    Referring Provider: Dr Jens Som Primary Care Physician: [facility]. Primary Gastroenterologist:  Dr. Earlean Shawl performed a colonoscopy about 10 years ago but she does not see him for any other reason.   Reason for Consultation:  Bile leak   HPI: [name] is a 65 y.o. female.  Hx ckd stage 3.  Migraines.  CVA.  S/p orthopedic surgeries.   Patient's meds include Plavix.  S/p 12/06/15 lap chole with biliary anatomy but CBD described as dilated on IOC in the surgical report. She discharged 2 days postop and required antibiotics post procedure. Pain persisted but got worse once she returned home. She developed fevers to 102. A CT scan was repeated today that shows a large fluid collection in the right upper quadrant, probably a biloma. Temperature measures up to 103 here at the hospital.  This afternoon interventional radiology placed a drain into the biloma resulting in immediate discharge of 290 mL of fluid.  HIDA scan does not confirm active leak at time of study..  Her alkaline phosphatase is 147. Transaminases and total bilirubin are normal. Albumin slightly low at 2.9. Both sodium and potassium are low. GFR normal.  WBC count elevated at 11.2.  Hemoglobin slightly reduced at 11.4.    Past Medical History:  Diagnosis Date  . Chronic kidney disease    stage 3  . GERD (gastroesophageal reflux disease)   . Hyperlipidemia   . Migraine headache   . Occipital neuralgia    Left  . Stroke [facility]) 10/2013    Past Surgical History:  Procedure Laterality Date  . ABDOMINAL HYSTERECTOMY    . BLADDER REPAIR    . CARPAL TUNNEL RELEASE    . CERVICAL SPINE SURGERY    . CHOLECYSTECTOMY N/A 12/06/2015   Procedure: LAPAROSCOPIC  CHOLECYSTECTOMY WITH  INTRAOPERATIVE CHOLANGIOGRAM;  Surgeon: Stark Klein, MD;  Location: [facility];  Service: General;  Laterality: N/A;  . ELBOW SURGERY    . KNEE SURGERY    . LUNG SURGERY      Prior to Admission medications   Medication Sig Start Date End Date Taking? Authorizing Provider  atorvastatin (LIPITOR) 40 MG tablet Take 1 tablet (40 mg total) by mouth daily. 01/04/15  Yes Elberta Leatherwood, MD  clopidogrel (PLAVIX) 75 MG tablet TAKE 1 TABLET BY MOUTH EVERY DAY 09/04/15  Yes Elberta Leatherwood, MD  doxepin (SINEQUAN) 100 MG capsule Take 2 capsules (200 mg total) by mouth at bedtime. 10/15/14  Yes Kathrynn Ducking, MD  furosemide (LASIX) 20 MG tablet Take 10 mg by mouth daily. 05/09/14  Yes Historical Provider, MD  gabapentin (NEURONTIN) 600 MG tablet Take 1 tablet (600 mg total) by mouth 2 (two) times daily. 12/05/15  Yes Charlett Blake, MD  lidocaine (LIDODERM) 5 % Place 2 patches onto the skin daily. Remove & Discard patch within 12 hours or as directed by MD 11/28/15  Yes Charlett Blake, MD  meclizine (ANTIVERT) 25 MG tablet Take 1 tablet (  25 mg total) by mouth 2 (two) times daily as needed for dizziness. 07/16/15  Yes Elberta Leatherwood, MD  ondansetron (ZOFRAN-ODT) 4 MG disintegrating tablet Take 1 tablet (4 mg total) by mouth every 6 (six) hours as needed for nausea. 12/08/15  Yes Jerrye Beavers, PA-C  oxyCODONE (OXY IR/ROXICODONE) 5 MG immediate release tablet Take 1-2 tablets (5-10 mg total) by mouth every 4 (four) hours as needed for moderate pain. 12/08/15  Yes Brooke A Miller, PA-C  ramipril (ALTACE) 2.5 MG capsule TAKE ONE CAPSULE BY MOUTH EVERY DAY 10/01/15  Yes Elberta Leatherwood, MD  sertraline (ZOLOFT) 50 MG tablet Take 1 tablet (50 mg total) by mouth 2 (two) times daily. 09/17/15  Yes Elberta Leatherwood, MD  tiZANidine (ZANAFLEX) 2 MG tablet Take 1 tablet (2 mg total) by mouth 3 (three) times daily. 09/12/15  Yes Ward Givens, NP  topiramate (TOPAMAX) 100 MG tablet TAKE 1 TABLET TWICE A DAY 05/07/15  Yes  Kathrynn Ducking, MD    Scheduled Meds: . docusate sodium  100 mg Oral BID  . enoxaparin (LOVENOX) injection  40 mg Subcutaneous Daily  . fentaNYL      . lidocaine      . midazolam      . piperacillin-tazobactam (ZOSYN)  IV  3.375 g Intravenous Q8H  . senna  1 tablet Oral BID   Infusions: . sodium chloride 125 mL/hr at 12/11/15 0552   PRN Meds: acetaminophen **OR** acetaminophen, bisacodyl, HYDROmorphone (DILAUDID) injection, ibuprofen, ondansetron **OR** ondansetron (ZOFRAN) IV, technetium TC 59M mebrofenin   Allergies as of 12/10/2015 - Review Complete 12/10/2015  Allergen Reaction Noted  . Shellfish allergy Anaphylaxis and Other (See Comments) 08/03/2012    Family History  Problem Relation Age of Onset  . Cancer Mother 5    breast  . Alzheimer's disease Mother   . Cancer Brother   . Hyperlipidemia Brother   . Prostate cancer Father   . Lung cancer Father     Social History   Social History  . Marital status: Married    Spouse name: [name]  . Number of children: 1  . Years of education: 12+   Occupational History  . [employer]  . CNA    Social History Main Topics  . Smoking status: Former Smoker    Packs/day: 0.30    Types: Cigarettes    Quit date: 11/01/2011  . Smokeless tobacco: Never Used  . Alcohol use No  . Drug use: No  . Sexual activity: Yes    Partners: Male    Birth control/ protection: Surgical     Comment: 1 sexual partner in last 12 months: married   Other Topics Concern  . Not on file   Social History Narrative   Patient is married ([name]).   Patient has one child.   Patient is working full-time.   Patient is right handed.   Patient has a college education.   Patient does not drink caffeine.    REVIEW OF SYSTEMS: Constitutional:  Fatigue, weakness. ENT:  No nose bleeds Pulm:  When she takes a deep breath, the abdominal pain feels worse. She is not coughing. CV:  No palpitations, no LE edema.  GU:  No  hematuria, no frequency GI:  Prior to the acute onset of her gallbladder symptoms a little more than a week ago, she had no real GI problems. Heme:  No unusual bleeding or bruising.   Transfusions:  None Neuro:  No headaches,  no peripheral tingling or numbness Derm:  No itching, no rash or sores.  Endocrine:  No sweats or chills.  No polyuria or dysuria Immunization:  Did not inquire as to recent immunizations   PHYSICAL EXAM: Vital signs in last 24 hours: Vitals:   12/11/15 1345 12/11/15 1404  BP: 123/64 127/67  Pulse: (!) 105 (!) 107  Resp: (!) 25 (!) 21  Temp:     Wt Readings from Last 3 Encounters:  12/10/15 75.3 kg (166 lb)  12/05/15 75.5 kg (166 lb 6.4 oz)  11/01/15 72.9 kg (160 lb 12 oz)    General: Pleasant, hand older white female. She looks uncomfortable and ill. Head:  No facial asymmetry or swelling. Her face is red (from rosacea she says) Eyes:  No scleral icterus. No conjunctival pallor Ears:  Not hard of hearing.  Nose:  No congestion or nasal discharge. Mouth:  Good dentition. Mucous membranes slightly dry but clear. Neck:  No JVD, TMG or masses. Lungs:  Auscultation is clear bilaterally. Somewhat reduced breath sounds bilaterally. Heart: Tachycardia. Regular rhythm. No murmurs rubs or gallops. Abdomen:  Soft. Hypoactive bowel sounds. Some tenderness over the abdomen especially near the drain.  Biloma drain reveals clear, dark, bilious material.  Rectal:  deferred   Musc/Skeltl:  no joint redness, swelling or gross deformities. Extremities:   No CCE.  Neurologic:   No limb weakness. She is alert and oriented 3. No tremor. Skin:   No rashes or sores. No telangiectasia. Skin is very tanned but not obviously jaundiced. Tattoos:   None observed. Nodes:   No cervical adenopathy   Psych:   Affect depressed, calm.  Intake/Output from previous day: 10/02 0701 - 10/03 0700 In: 2191.7 [I.V.:141.7; IV Piggyback:2050] Out: -  Intake/Output this shift: Total  I/O In: 175 [I.V.:125; IV Piggyback:50] Out: 60 [Urine:60]  LAB RESULTS:  Recent Labs  12/10/15 1850  WBC 11.2*  HGB 11.4*  HCT 34.9*  PLT 320   BMET Lab Results  Component Value Date   NA 133 (L) 12/10/2015   NA 139 12/08/2015   NA 140 12/07/2015   K 3.2 (L) 12/10/2015   K 3.5 12/08/2015   K 2.5 (LL) 12/07/2015   CL 105 12/10/2015   CL 111 12/08/2015   CL 110 12/07/2015   CO2 18 (L) 12/10/2015   CO2 23 12/08/2015   CO2 26 12/07/2015   GLUCOSE 111 (H) 12/10/2015   GLUCOSE 113 (H) 12/08/2015   GLUCOSE 115 (H) 12/07/2015   BUN 6 12/10/2015   BUN <5 (L) 12/08/2015   BUN <5 (L) 12/07/2015   CREATININE 0.87 12/10/2015   CREATININE 0.77 12/08/2015   CREATININE 0.94 12/07/2015   CALCIUM 8.2 (L) 12/10/2015   CALCIUM 7.8 (L) 12/08/2015   CALCIUM 7.8 (L) 12/07/2015   LFT  Recent Labs  12/10/15 1850  PROT 6.5  ALBUMIN 2.9*  AST 34  ALT 45  ALKPHOS 147*  BILITOT 0.9   PT/INR Lab Results  Component Value Date   INR 1.10 12/11/2015   INR 1.03 12/05/2015   INR 1.03 10/10/2013   Hepatitis Panel No results for input(s): HEPBSAG, HCVAB, HEPAIGM, HEPBIGM in the last 72 hours. C-Diff No components found for: CDIFF Lipase     Component Value Date/Time   LIPASE 27 12/04/2015 1602    Drugs of Abuse     Component Value Date/Time   LABOPIA NONE DETECTED 10/10/2013 1624   COCAINSCRNUR NONE DETECTED 10/10/2013 1624   LABBENZ POSITIVE (A) 10/10/2013 1624  AMPHETMU NONE DETECTED 10/10/2013 1624   THCU NONE DETECTED 10/10/2013 1624   LABBARB NONE DETECTED 10/10/2013 1624     RADIOLOGY STUDIES: Dg Chest 2 View  Result Date: 12/10/2015 CLINICAL DATA:  History of gallbladder surgery last Wednesday. Developed fever and cough on Sunday with right-sided abdominal pain radiating up into chest. EXAM: CHEST  2 VIEW COMPARISON:  Prior chest radiographs dating back through 02/07/2012 FINDINGS: Low lung volumes since previous studies. Trace fluid in the minor fissure on  the right with elevated appearance of the right hemidiaphragm which may in fact be due to a moderate-sized subpulmonic effusion. Slight blunting left lateral costophrenic angle as well presumably from a effusion. The heart and mediastinal contours are stable and within normal limits for size. Mild atherosclerosis of the aortic arch. The patient is status post ACDF of the lower cervical spine. Chronic L1 mild compression. IMPRESSION: Elevated appearance of the right hemidiaphragm which may in fact be due to a moderate subpulmonic pleural effusion as there is also some fluid in the right minor fissure and left costophrenic angle on current exam. Decubitus views (right-side-down) of the chest may be provide more information as to whether this represents pleural fluid or CT as deemed clinically necessary. Electronically Signed   By: Ashley Royalty M.D.   On: 12/10/2015 19:45   Ct Abdomen Pelvis W Contrast  Result Date: 12/11/2015 CLINICAL DATA:  Status post cholecystectomy 5 days ago, now with fever, intermittent confusion. Constipation. EXAM: CT ABDOMEN AND PELVIS WITH CONTRAST TECHNIQUE: Multidetector CT imaging of the abdomen and pelvis was performed using the standard protocol following bolus administration of intravenous contrast. CONTRAST:  29mL ISOVUE-300 IOPAMIDOL (ISOVUE-300) INJECTION 61% COMPARISON:  CT abdomen and pelvis June 21, 2013 FINDINGS: LOWER CHEST: Partially imaged small to moderate pleural effusion. Enhancing RIGHT middle lobe and RIGHT lower lobe atelectasis. Trace LEFT pleural effusion and atelectasis. Heart size is normal. Mild coronary artery calcifications . HEPATOBILIARY: Status post cholecystectomy. 7.2 x 3.8 cm fluid collection within the gallbladder fossa, this is contiguous with 5.8 x 11.6 x 15.7 cm (volume = 550 cm^3) hepatic subcapsular fluid collection. Small amount of fluid extends to Morrison's pouch. Mass effect on the RIGHT lobe of the liver, trace intrahepatic biliary  dilatation. No choledocholithiasis. PANCREAS: Normal. SPLEEN: Normal. ADRENALS/URINARY TRACT: Kidneys are orthotopic, demonstrating symmetric enhancement. No nephrolithiasis, hydronephrosis or solid renal masses. The unopacified ureters are normal in course and caliber. Delayed imaging through the kidneys demonstrates symmetric prompt contrast excretion within the proximal urinary collecting system. Urinary bladder is partially distended and unremarkable. Normal adrenal glands. STOMACH/BOWEL: The stomach, small and large bowel are normal in course and caliber without inflammatory changes. Mild amount of retained large bowel stool . Five pill like densities in the cecum. Normal appendix. VASCULAR/LYMPHATIC: Aortoiliac vessels are normal in course and caliber, severe calcific atherosclerosis. Normal appendix. No lymphadenopathy by CT size criteria. REPRODUCTIVE: Status post hysterectomy. OTHER: Small amount of low-density free fluid in the pelvis. No intraperitoneal free air. MUSCULOSKELETAL: Nonacute. Mild chronic L1 compression fracture. Severe L5-S1 degenerative disc and severe L5-S1 neural foraminal narrowing. IMPRESSION: Status post recent cholecystectomy with fluid collection within the gallbladder fossa contiguous with hepatic subcapsular 5.8 x 11.6 x 15.7 cm fluid collection compatible with biloma. Superimposed infection possible. Small amount of free fluid in the pelvis. Small to moderate RIGHT pleural effusion, trace on the LEFT with compressive atelectasis. Severe atherosclerosis. Acute findings discussed with and reconfirmed by [facility] NANAVATI on 12/11/2015 at 4:30 am. Electronically Signed  By: Elon Alas M.D.   On: 12/11/2015 04:33   [place] Image Guided Drainage By Percutaneous Catheter  Result Date: 12/11/2015 INDICATION: 65 year old female with fluid collection after cholecystectomy. EXAM: [place] IMAGE GUIDED DRAINAGE BY PERCUTANEOUS CATHETE MEDICATIONS: The patient is currently admitted to the  hospital and receiving intravenous antibiotics. The antibiotics were administered within an appropriate time frame prior to the initiation of the procedure. ANESTHESIA/SEDATION: Fentanyl 50 mcg IV; Versed 1.0 mg IV Moderate Sedation Time:  15 minutes The patient was continuously monitored during the procedure by the interventional radiology nurse under my direct supervision. COMPLICATIONS: None PROCEDURE: Informed written consent was obtained from the patient after a thorough discussion of the procedural risks, benefits and alternatives. All questions were addressed. Maximal Sterile Barrier Technique was utilized including caps, mask, sterile gowns, sterile gloves, sterile drape, hand hygiene and skin antiseptic. A timeout was performed prior to the initiation of the procedure. Patient positioned supine position on the ultrasound table. Scout images of the upper abdomen were performed for planning purposes. Patient was then prepped and draped in the usual sterile fashion. The skin and subcutaneous tissues were generously infiltrated 1% lidocaine for local anesthesia. Small stab incision was made with 11 blade scalpel. Using ultrasound guidance, trocar technique was used to advance a 10 [place] drain into the subhepatic fluid collection. Once the drain was in place and confirmed in position, spontaneous ileus fluid returned. Approximately 290 cc of ileus fluid was aspirated. Sample was aspirated for the lab. Catheter was sutured in position and a final image was stored. Catheter attached to gravity drainage. Patient tolerated the procedure well and remained hemodynamically stable throughout. No complications were encountered and no significant blood loss encountered. IMPRESSION: Status post ultrasound-guided drainage of subhepatic fluid collection with approximately 290 cc of ileus fluid removed. Sample was sent the lab for culture. Signed, Dulcy Fanny. Earleen Newport, DO [facility]  [facility] Electronically Signed   By: Corrie Mckusick D.O.   On: 12/11/2015 15:03    ENDOSCOPIC STUDIES: None  IMPRESSION:   *  Post laparoscopic cholecystectomy biloma. Presumed biliary leak.  *  6 days status post laparoscopic cholecystectomy.  *  Atherosclerosis sclerosis, "severe" per abdominal/pelvic CT imaging earlier today. Takes Plavix for history of CVA. It's been several days since she took Plavix, she did not resume it following surgery.  *  Pleural effusions bil per CT scan.   PLAN:     *  Dr. Silverio Decamp as well as Dr. Rachael Darby Will review the case with forthcoming plans. Have placed orders in the endoscopy unit depot for ERCP and stent placement in case these need to be scheduled. Patient is now receiving Zosyn and is nothing by mouth.  *  Discontinue the PRN ibuprofen. Dilaudid available for pain and ibuprofen could cause renal injury as well as GI bleeding.  Will add scheduled famotidine.   Azucena Freed  12/11/2015, 3:32 PM Pager: [phone]

## 2015-12-11 NOTE — ED Provider Notes (Signed)
[facility] DEPT Provider Note   CSN: [id number] Arrival date & time: 12/10/15  1725  By signing my name below, I, Johnney Killian, attest that this documentation has been prepared under the direction and in the presence of Varney Biles, MD. Electronically Signed: Johnney Killian, ED Scribe. 12/11/15. 1:09 AM.   History   Chief Complaint Chief Complaint  Patient presents with  . Fever  . Abdominal Pain    HPI Comments: [name] is a 65 y.o. female who presents to the Emergency Department with a relative complaining of constant abdominal pain with radiation to the right shoulder of onset 2-3 days PTA. Pt underwent laparoscopic cholecystectomy 5 days ago. Pt says pain is exacerbated by deep inspiration. Pt says she has experienced fever with greatly-increased sweating. Pt's relative says pt has had some confusion. Pt has had no BM for one week but has experienced flatus since the surgery. Pt says she is eating well. Pt denies chills, vomiting, and diarrhea. Pt says she is on blood thinning medications.     The history is provided by the patient and a relative. No language interpreter was used.    Past Medical History:  Diagnosis Date  . Chronic kidney disease    stage 3  . GERD (gastroesophageal reflux disease)   . Hyperlipidemia   . Migraine headache   . Occipital neuralgia    Left  . Stroke [facility]) 10/2013    Patient Active Problem List   Diagnosis Date Noted  . Sepsis ([facility]) 12/11/2015  . Symptomatic cholelithiasis 12/05/2015  . HLD (hyperlipidemia) 04/02/2015  . Chronic migraine without aura, with status migrainosus 09/26/2014  . Dizziness 11/09/2013  . Adjustment disorder with mixed anxiety and depressed mood 11/03/2013  . Migraine variant 10/10/2013  . Disturbance of skin sensation 10/10/2013  . TIA (transient ischemic attack) 10/10/2013  . HTN (hypertension) 07/18/2013  . 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 Neuralgia, neuritis, and radiculitis,  unspecified 02/04/2012  . 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 Myofascial muscle pain 07/15/2011    Past Surgical History:  Procedure Laterality Date  . ABDOMINAL HYSTERECTOMY    . BLADDER REPAIR    . CARPAL TUNNEL RELEASE    . CERVICAL SPINE SURGERY    . CHOLECYSTECTOMY N/A 12/06/2015   Procedure: LAPAROSCOPIC CHOLECYSTECTOMY WITH  INTRAOPERATIVE CHOLANGIOGRAM;  Surgeon: Stark Klein, MD;  Location: [facility];  Service: General;  Laterality: N/A;  . ELBOW SURGERY    . KNEE SURGERY    . LUNG SURGERY      OB History    No data available       Home Medications    Prior to Admission medications   Medication Sig Start Date End Date Taking? Authorizing Provider  atorvastatin (LIPITOR) 40 MG tablet Take 1 tablet (40 mg total) by mouth daily. 01/04/15  Yes Elberta Leatherwood, MD  clopidogrel (PLAVIX) 75 MG tablet TAKE 1 TABLET BY MOUTH EVERY DAY 09/04/15  Yes Elberta Leatherwood, MD  doxepin (SINEQUAN) 100 MG capsule Take 2 capsules (200 mg total) by mouth at bedtime. 10/15/14  Yes Kathrynn Ducking, MD  furosemide (LASIX) 20 MG tablet Take 10 mg by mouth daily. 05/09/14  Yes Historical Provider, MD  gabapentin (NEURONTIN) 600 MG tablet Take 1 tablet (600 mg total) by mouth 2 (two) times daily. 12/05/15  Yes Charlett Blake, MD  lidocaine (LIDODERM) 5 % Place 2 patches onto the skin daily. Remove & Discard patch within 12 hours or as directed by MD 11/28/15  Yes  Charlett Blake, MD  meclizine (ANTIVERT) 25 MG tablet Take 1 tablet (25 mg total) by mouth 2 (two) times daily as needed for dizziness. 07/16/15  Yes Elberta Leatherwood, MD  ondansetron (ZOFRAN-ODT) 4 MG disintegrating tablet Take 1 tablet (4 mg total) by mouth every 6 (six) hours as needed for nausea. 12/08/15  Yes Jerrye Beavers, PA-C  oxyCODONE (OXY IR/ROXICODONE) 5 MG immediate release tablet Take 1-2 tablets (5-10 mg total) by mouth every 4 (four) hours as needed for moderate pain. 12/08/15  Yes Brooke A Miller, PA-C  ramipril (ALTACE) 2.5 MG capsule  TAKE ONE CAPSULE BY MOUTH EVERY DAY 10/01/15  Yes Elberta Leatherwood, MD  sertraline (ZOLOFT) 50 MG tablet Take 1 tablet (50 mg total) by mouth 2 (two) times daily. 09/17/15  Yes Elberta Leatherwood, MD  tiZANidine (ZANAFLEX) 2 MG tablet Take 1 tablet (2 mg total) by mouth 3 (three) times daily. 09/12/15  Yes Ward Givens, NP  topiramate (TOPAMAX) 100 MG tablet TAKE 1 TABLET TWICE A DAY 05/07/15  Yes Kathrynn Ducking, MD    Family History Family History  Problem Relation Age of Onset  . Cancer Mother 40    breast  . Alzheimer's disease Mother   . Cancer Brother   . Hyperlipidemia Brother   . Prostate cancer Father   . Lung cancer Father     Social History Social History  Substance Use Topics  . Smoking status: Former Smoker    Packs/day: 0.30    Types: Cigarettes    Quit date: 11/01/2011  . Smokeless tobacco: Never Used  . Alcohol use No     Allergies   Shellfish allergy   Review of Systems Review of Systems  Constitutional: Positive for diaphoresis and fever. Negative for appetite change and chills.  Gastrointestinal: Positive for abdominal pain. Negative for diarrhea and vomiting.  Musculoskeletal: Positive for arthralgias.  Psychiatric/Behavioral: Positive for confusion.     10 Systems reviewed and are negative for acute change except as noted in the HPI.    Physical Exam Updated Vital Signs BP 124/62   Pulse 85   Temp 98.2 F (36.8 C) (Oral)   Resp 19   Ht 5\' 7"  (1.702 m)   Wt 166 lb (75.3 kg)   SpO2 96%   BMI 26.00 kg/m   Physical Exam  Constitutional: She is oriented to person, place, and time. She appears well-developed and well-nourished. No distress.  HENT:  Head: Normocephalic and atraumatic.  Right Ear: Hearing normal.  Left Ear: Hearing normal.  Nose: Nose normal.  Mouth/Throat: Oropharynx is clear and moist and mucous membranes are normal.  Eyes: Conjunctivae and EOM are normal. Pupils are equal, round, and reactive to light.  Neck: Normal range of  motion. Neck supple.  Cardiovascular: S1 normal and S2 normal.  Exam reveals no gallop and no friction rub.   No murmur heard. Tachycardia  Pulmonary/Chest: Effort normal and breath sounds normal. No respiratory distress. She exhibits no tenderness.  Lungs CTA bilaterally  Abdominal: Soft. Normal appearance and bowel sounds are normal. There is no hepatosplenomegaly. There is tenderness. There is rebound and guarding. There is no tenderness at McBurney's point and negative Murphy's sign. No hernia.  Abdomen soft Generalized tenderness worse on RUQ and RLQ 3 incision wounds on the RUQ and in the periumbilical region- wounds show mild erythema and no drainage Positive voluntary guarding with rebound tenderness  Musculoskeletal: Normal range of motion.  Neurological: She is alert and oriented to  person, place, and time. She has normal strength. No cranial nerve deficit or sensory deficit. Coordination normal. GCS eye subscore is 4. GCS verbal subscore is 5. GCS motor subscore is 6.  Skin: Skin is warm, dry and intact. No rash noted. No cyanosis.  Psychiatric: She has a normal mood and affect. Her speech is normal and behavior is normal. Thought content normal.  Nursing note and vitals reviewed.    ED Treatments / Results   DIAGNOSTIC STUDIES: Oxygen Saturation is 94% on RA, adequate by my interpretation.    COORDINATION OF CARE: 12:29 AM Discussed treatment plan with pt and relative at bedside and pt and relative agreed to plan.   Labs (all labs ordered are listed, but only abnormal results are displayed) Labs Reviewed  BLOOD CULTURE ID PANEL (REFLEXED) - Abnormal; Notable for the following:       Result Value   Streptococcus species DETECTED (*)    All other components within normal limits  COMPREHENSIVE METABOLIC PANEL - Abnormal; Notable for the following:    Sodium 133 (*)    Potassium 3.2 (*)    CO2 18 (*)    Glucose, Bld 111 (*)    Calcium 8.2 (*)    Albumin 2.9 (*)     Alkaline Phosphatase 147 (*)    All other components within normal limits  CBC WITH DIFFERENTIAL/PLATELET - Abnormal; Notable for the following:    WBC 11.2 (*)    Hemoglobin 11.4 (*)    HCT 34.9 (*)    Neutro Abs 9.1 (*)    All other components within normal limits  URINALYSIS, ROUTINE W REFLEX MICROSCOPIC (NOT AT [facility]) - Abnormal; Notable for the following:    Specific Gravity, Urine 1.004 (*)    All other components within normal limits  CULTURE, BLOOD (ROUTINE X 2)  CULTURE, BLOOD (ROUTINE X 2)  MRSA PCR SCREENING  AEROBIC/ANAEROBIC CULTURE (SURGICAL/DEEP WOUND)  URINE CULTURE  PROTIME-INR  COMPREHENSIVE METABOLIC PANEL  MAGNESIUM  CBC  I-STAT BETA HCG BLOOD, ED ([facility], [facility], [facility] ONLY)  I-STAT CG4 LACTIC ACID, ED  I-STAT TROPOININ, ED    EKG  EKG Interpretation  Date/Time:  Tuesday December 11 2015 03:26:56 EDT Ventricular Rate:  119 PR Interval:    QRS Duration: 78 QT Interval:  307 QTC Calculation: 432 R Axis:   87 Text Interpretation:  Sinus tachycardia Borderline right axis deviation Low voltage, precordial leads Borderline T abnormalities, diffuse leads Nonspecific ST and T wave abnormality No acute changes Confirmed by Kathrynn Humble, MD, Thelma Comp [phone]) on 12/11/2015 3:29:37 AM Also confirmed by Kathrynn Humble, MD, Thelma Comp [phone]), editor [place], Joelene Millin [phone])  on 12/11/2015 8:13:37 AM       Radiology Dg Chest 2 View  Result Date: 12/10/2015 CLINICAL DATA:  History of gallbladder surgery last Wednesday. Developed fever and cough on Sunday with right-sided abdominal pain radiating up into chest. EXAM: CHEST  2 VIEW COMPARISON:  Prior chest radiographs dating back through 02/07/2012 FINDINGS: Low lung volumes since previous studies. Trace fluid in the minor fissure on the right with elevated appearance of the right hemidiaphragm which may in fact be due to a moderate-sized subpulmonic effusion. Slight blunting left lateral costophrenic angle as well presumably from a effusion. The heart  and mediastinal contours are stable and within normal limits for size. Mild atherosclerosis of the aortic arch. The patient is status post ACDF of the lower cervical spine. Chronic L1 mild compression. IMPRESSION: Elevated appearance of the right hemidiaphragm which may in fact be due  to a moderate subpulmonic pleural effusion as there is also some fluid in the right minor fissure and left costophrenic angle on current exam. Decubitus views (right-side-down) of the chest may be provide more information as to whether this represents pleural fluid or CT as deemed clinically necessary. Electronically Signed   By: Ashley Royalty M.D.   On: 12/10/2015 19:45   Nm Hepatobiliary Liver Func  Result Date: 12/11/2015 CLINICAL DATA:  64 year old female with a history of cholecystectomy and biloma formation. EXAM: NUCLEAR MEDICINE HEPATOBILIARY IMAGING TECHNIQUE: Sequential images of the abdomen were obtained out to 60 minutes following intravenous administration of radiopharmaceutical. RADIOPHARMACEUTICALS:  5.0 mCi Tc-68m  Choletec IV COMPARISON:  None. FINDINGS: Prompt, uniform hepatic uptake with biliary excretion. Activity within the biliary duct evident at approximately 15 minutes. No focal concentration of radiotracer at the inferior liver margin. IMPRESSION: No focal concentration of radiotracer at the inferior liver margin to confirm biliary leak on the current study. Signed, Dulcy Fanny. Earleen Newport, DO [facility] Radiology Electronically Signed   By: Corrie Mckusick D.O.   On: 12/11/2015 16:16   Ct Abdomen Pelvis W Contrast  Result Date: 12/11/2015 CLINICAL DATA:  Status post cholecystectomy 5 days ago, now with fever, intermittent confusion. Constipation. EXAM: CT ABDOMEN AND PELVIS WITH CONTRAST TECHNIQUE: Multidetector CT imaging of the abdomen and pelvis was performed using the standard protocol following bolus administration of intravenous contrast. CONTRAST:  14mL  ISOVUE-300 IOPAMIDOL (ISOVUE-300) INJECTION 61% COMPARISON:  CT abdomen and pelvis June 21, 2013 FINDINGS: LOWER CHEST: Partially imaged small to moderate pleural effusion. Enhancing RIGHT middle lobe and RIGHT lower lobe atelectasis. Trace LEFT pleural effusion and atelectasis. Heart size is normal. Mild coronary artery calcifications . HEPATOBILIARY: Status post cholecystectomy. 7.2 x 3.8 cm fluid collection within the gallbladder fossa, this is contiguous with 5.8 x 11.6 x 15.7 cm (volume = 550 cm^3) hepatic subcapsular fluid collection. Small amount of fluid extends to Morrison's pouch. Mass effect on the RIGHT lobe of the liver, trace intrahepatic biliary dilatation. No choledocholithiasis. PANCREAS: Normal. SPLEEN: Normal. ADRENALS/URINARY TRACT: Kidneys are orthotopic, demonstrating symmetric enhancement. No nephrolithiasis, hydronephrosis or solid renal masses. The unopacified ureters are normal in course and caliber. Delayed imaging through the kidneys demonstrates symmetric prompt contrast excretion within the proximal urinary collecting system. Urinary bladder is partially distended and unremarkable. Normal adrenal glands. STOMACH/BOWEL: The stomach, small and large bowel are normal in course and caliber without inflammatory changes. Mild amount of retained large bowel stool . Five pill like densities in the cecum. Normal appendix. VASCULAR/LYMPHATIC: Aortoiliac vessels are normal in course and caliber, severe calcific atherosclerosis. Normal appendix. No lymphadenopathy by CT size criteria. REPRODUCTIVE: Status post hysterectomy. OTHER: Small amount of low-density free fluid in the pelvis. No intraperitoneal free air. MUSCULOSKELETAL: Nonacute. Mild chronic L1 compression fracture. Severe L5-S1 degenerative disc and severe L5-S1 neural foraminal narrowing. IMPRESSION: Status post recent cholecystectomy with fluid collection within the gallbladder fossa contiguous with hepatic subcapsular 5.8 x 11.6 x  15.7 cm fluid collection compatible with biloma. Superimposed infection possible. Small amount of free fluid in the pelvis. Small to moderate RIGHT pleural effusion, trace on the LEFT with compressive atelectasis. Severe atherosclerosis. Acute findings discussed with and reconfirmed by [facility] Grayson White on 12/11/2015 at 4:30 am. Electronically Signed   By: Elon Alas M.D.   On: 12/11/2015 04:33   [place] Image Guided Drainage By Percutaneous Catheter  Result Date: 12/11/2015 INDICATION: 65 year old female with fluid collection after cholecystectomy. EXAM: [place] IMAGE  GUIDED DRAINAGE BY PERCUTANEOUS CATHETE MEDICATIONS: The patient is currently admitted to the hospital and receiving intravenous antibiotics. The antibiotics were administered within an appropriate time frame prior to the initiation of the procedure. ANESTHESIA/SEDATION: Fentanyl 50 mcg IV; Versed 1.0 mg IV Moderate Sedation Time:  15 minutes The patient was continuously monitored during the procedure by the interventional radiology nurse under my direct supervision. COMPLICATIONS: None PROCEDURE: Informed written consent was obtained from the patient after a thorough discussion of the procedural risks, benefits and alternatives. All questions were addressed. Maximal Sterile Barrier Technique was utilized including caps, mask, sterile gowns, sterile gloves, sterile drape, hand hygiene and skin antiseptic. A timeout was performed prior to the initiation of the procedure. Patient positioned supine position on the ultrasound table. Scout images of the upper abdomen were performed for planning purposes. Patient was then prepped and draped in the usual sterile fashion. The skin and subcutaneous tissues were generously infiltrated 1% lidocaine for local anesthesia. Small stab incision was made with 11 blade scalpel. Using ultrasound guidance, trocar technique was used to advance a 10 [place] drain into the subhepatic fluid collection. Once the drain was in  place and confirmed in position, spontaneous ileus fluid returned. Approximately 290 cc of ileus fluid was aspirated. Sample was aspirated for the lab. Catheter was sutured in position and a final image was stored. Catheter attached to gravity drainage. Patient tolerated the procedure well and remained hemodynamically stable throughout. No complications were encountered and no significant blood loss encountered. IMPRESSION: Status post ultrasound-guided drainage of subhepatic fluid collection with approximately 290 cc of ileus fluid removed. Sample was sent the lab for culture. Signed, Dulcy Fanny. Earleen Newport, DO [facility] Radiology Electronically Signed   By: Corrie Mckusick D.O.   On: 12/11/2015 15:03    Procedures Procedures (including critical care time)  Medications Ordered in ED Medications  iopamidol (ISOVUE-300) 61 % injection (not administered)  iopamidol (ISOVUE-300) 61 % injection (not administered)  0.9 %  sodium chloride infusion ( Intravenous Transfusing/Transfer 12/11/15 0558)  acetaminophen (TYLENOL) tablet 650 mg (650 mg Oral Given 12/11/15 1638)    Or  acetaminophen (TYLENOL) suppository 650 mg ( Rectal See Alternative 12/11/15 1638)  HYDROmorphone (DILAUDID) injection 0.2 mg (not administered)  docusate sodium (COLACE) capsule 100 mg (100 mg Oral Given 12/11/15 2106)  senna (SENOKOT) tablet 8.6 mg (8.6 mg Oral Not Given 12/11/15 2200)  bisacodyl (DULCOLAX) suppository 10 mg (not administered)  ondansetron (ZOFRAN-ODT) disintegrating tablet 4 mg (not administered)    Or  ondansetron (ZOFRAN) injection 4 mg (not administered)  piperacillin-tazobactam (ZOSYN) IVPB 3.375 g (3.375 g Intravenous Given 12/11/15 2105)  enoxaparin (LOVENOX) injection 40 mg (40 mg Subcutaneous Given 12/11/15 2106)  lidocaine (XYLOCAINE) 1 % (with pres) injection (not administered)  fentaNYL (SUBLIMAZE) 100 MCG/2ML injection (not administered)  midazolam (VERSED) 2  MG/2ML injection (not administered)  technetium TC 68M mebrofenin (CHOLETEC) injection 5 millicurie (not administered)  famotidine (PEPCID) 40 MG/5ML suspension 20 mg (20 mg Oral Given 12/11/15 2106)  HYDROmorphone (DILAUDID) injection 1 mg (1 mg Intravenous Given 12/11/15 0134)  ondansetron (ZOFRAN) injection 4 mg (4 mg Intravenous Given 12/11/15 0134)  piperacillin-tazobactam (ZOSYN) IVPB 3.375 g (0 g Intravenous Stopped 12/11/15 0339)  lactated ringers bolus 2,000 mL (0 mLs Intravenous Stopped 12/11/15 0541)  acetaminophen (TYLENOL) suppository 650 mg (650 mg Rectal Given 12/11/15 0218)  iopamidol (ISOVUE-300) 61 % injection 80 mL (80 mLs Intravenous Contrast Given 12/11/15 0358)  metoCLOPramide (REGLAN) injection 10 mg (10  mg Intravenous Given 12/11/15 0457)  midazolam (VERSED) injection (1 mg Intravenous Given 12/11/15 1230)  fentaNYL (SUBLIMAZE) injection (50 mcg Intravenous Given 12/11/15 1237)     Initial Impression / Assessment and Plan / ED Course  I have reviewed the triage vital signs and the nursing notes.  Pertinent labs & imaging results that were available during my care of the patient were reviewed by me and considered in my medical decision making (see chart for details).  Clinical Course  Value Comment By Time  CT Abdomen Pelvis W Contrast CT shows large biloma. Pt made aware. Likely infected, as the temp continues to be high. She is septic. Lactate was normal. No signs of end organ damage really. Blood cultures ordered. General surgery consulted. Pt has been npo. Varney Biles, MD 10/03 [phone]   Pt comes in with cc of abd pain. Abd pain is R sided. Pt is post op cholecystectomy, pod # 6. Reports worsening abd pain, nausea, fevers - symptoms getting worse over the last 2-3 days. She has guarding over the R side, and the tenderness is focal.  DDX: Biliary /cystic duct leak / injury Bowel injury Bleeding Seroma Intra-abd infection Choledocholithiasis  We will start zosyn  and fluids. CT ordered.  Final Clinical Impressions(s) / ED Diagnoses   Final diagnoses:  Biloma following surgery, initial encounter  Status post cholecystectomy  Biloma    New Prescriptions Current Discharge Medication List     I personally performed the services described in this documentation, which was scribed in my presence. The recorded information has been reviewed and is accurate.      Varney Biles, MD 12/11/15 [phone]

## 2015-12-11 NOTE — Procedures (Signed)
Interventional Radiology Procedure Note  Procedure:  US guided drainage of RUQ biloma.  36F drain placed. ~290cc of bilious fluid aspirated.   Recommendations:  - To gravity drain.  - Sample to lab for analysis.  - Do not submerge   - Routine care   Signed,  Dulcy Fanny. Earleen Newport, DO

## 2015-12-11 NOTE — Progress Notes (Signed)
PHARMACY - PHYSICIAN COMMUNICATION CRITICAL VALUE ALERT - BLOOD CULTURE IDENTIFICATION (BCID)  Results for orders placed or performed during the hospital encounter of 12/11/15  Blood Culture ID Panel (Reflexed) (Collected: 12/10/2015  7:00 PM)  Result Value Ref Range   Enterococcus species NOT DETECTED NOT DETECTED   Listeria monocytogenes NOT DETECTED NOT DETECTED   Staphylococcus species NOT DETECTED NOT DETECTED   Staphylococcus aureus NOT DETECTED NOT DETECTED   Streptococcus species DETECTED (A) NOT DETECTED   Streptococcus agalactiae NOT DETECTED NOT DETECTED   Streptococcus pneumoniae NOT DETECTED NOT DETECTED   Streptococcus pyogenes NOT DETECTED NOT DETECTED   Acinetobacter baumannii NOT DETECTED NOT DETECTED   Enterobacteriaceae species NOT DETECTED NOT DETECTED   Enterobacter cloacae complex NOT DETECTED NOT DETECTED   Escherichia coli NOT DETECTED NOT DETECTED   Klebsiella oxytoca NOT DETECTED NOT DETECTED   Klebsiella pneumoniae NOT DETECTED NOT DETECTED   Proteus species NOT DETECTED NOT DETECTED   Serratia marcescens NOT DETECTED NOT DETECTED   Haemophilus influenzae NOT DETECTED NOT DETECTED   Neisseria meningitidis NOT DETECTED NOT DETECTED   Pseudomonas aeruginosa NOT DETECTED NOT DETECTED   Candida albicans NOT DETECTED NOT DETECTED   Candida glabrata NOT DETECTED NOT DETECTED   Candida krusei NOT DETECTED NOT DETECTED   Candida parapsilosis NOT DETECTED NOT DETECTED   Candida tropicalis NOT DETECTED NOT DETECTED    Name of physician (or Provider) Contacted: [name]  65 year old woman s/p lap chole 9/28, found to have large fluid collection in RUQ.  S/p drainage by IR - 290ML of fluid drained off and sent for analysis.  Two aerobic blood cultures drawn yesterday are now positive for streptococcus species.   Changes to prescribed antibiotics required: None - continue with Zosyn for now.  May be able to de-escalate based on final ID and sensitivitiy  [name] 12/11/2015  1:14 PM

## 2015-12-11 NOTE — Progress Notes (Signed)
Permission acquired from Dr. Rush Farmer at 1315 for pt. To remain on tele box without RN at bedside while completing NM study.

## 2015-12-11 NOTE — H&P (Signed)
Surgical Consultation/H&P  CC: Fever, altered mental status  HPI: 65yo woman discharged 3 days ago s/p uneventful lap chole 5 days ago. She was running a low grade temprature on 9/30 and 10/1 but was otherwise doing well, and then began to have worsening confusion on 10/1-10/2 with escalation of her fever to >102. 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  Allergies  Allergen Reactions  . Shellfish Allergy Anaphylaxis and Other (See Comments)    All seafood    Past Medical History:  Diagnosis Date  . Chronic kidney disease    stage 3  . GERD (gastroesophageal reflux disease)   . Hyperlipidemia   . Migraine headache   . Occipital neuralgia    Left  . Stroke [facility]) 10/2013    Past Surgical History:  Procedure Laterality Date  . ABDOMINAL HYSTERECTOMY    . BLADDER REPAIR    . CARPAL TUNNEL RELEASE    . CERVICAL SPINE SURGERY    . CHOLECYSTECTOMY N/A 12/06/2015   Procedure: LAPAROSCOPIC CHOLECYSTECTOMY WITH  INTRAOPERATIVE CHOLANGIOGRAM;  Surgeon: Stark Klein, MD;  Location: [place];  Service: General;  Laterality: N/A;  . ELBOW SURGERY    . KNEE SURGERY    . LUNG SURGERY      Family History  Problem Relation Age of Onset  . Cancer Mother 36    breast  . Alzheimer's disease Mother   . Cancer Brother   . Hyperlipidemia Brother   . Prostate cancer Father   . Lung cancer Father     Social History   Social History  . Marital status: Married    Spouse name: [name]  . Number of children: 1  . Years of education: 12+   Occupational History  . [employer]  . CNA    Social History Main Topics  . Smoking status: Former Smoker    Packs/day: 0.30    Types: Cigarettes    Quit date: 11/01/2011  . Smokeless tobacco: Never Used  . Alcohol use No  . Drug use: No  . Sexual activity: Yes    Partners: Male    Birth control/ protection: Surgical     Comment: 1 sexual partner in last 12  months: married   Other Topics Concern  . None   Social History Narrative   Patient is married ([name]).   Patient has one child.   Patient is working full-time.   Patient is right handed.   Patient has a college education.   Patient does not drink caffeine.    No current facility-administered medications on file prior to encounter.    Current Outpatient Prescriptions on File Prior to Encounter  Medication Sig Dispense Refill  . atorvastatin (LIPITOR) 40 MG tablet Take 1 tablet (40 mg total) by mouth daily. 90 tablet 3  . clopidogrel (PLAVIX) 75 MG tablet TAKE 1 TABLET BY MOUTH EVERY DAY 30 tablet 5  . doxepin (SINEQUAN) 100 MG capsule Take 2 capsules (200 mg total) by mouth at bedtime. 180 capsule 1  . furosemide (LASIX) 20 MG tablet Take 10 mg by mouth daily.  6  . gabapentin (NEURONTIN) 600 MG tablet Take 1 tablet (600 mg total) by mouth 2 (two) times daily. 180 tablet 1  . lidocaine (LIDODERM) 5 % Place 2 patches onto the skin daily. Remove & Discard patch within 12 hours or as directed by MD 60 patch 5  . meclizine (ANTIVERT) 25  MG tablet Take 1 tablet (25 mg total) by mouth 2 (two) times daily as needed for dizziness. 20 tablet 0  . ondansetron (ZOFRAN-ODT) 4 MG disintegrating tablet Take 1 tablet (4 mg total) by mouth every 6 (six) hours as needed for nausea. 20 tablet 0  . oxyCODONE (OXY IR/ROXICODONE) 5 MG immediate release tablet Take 1-2 tablets (5-10 mg total) by mouth every 4 (four) hours as needed for moderate pain. 30 tablet 0  . ramipril (ALTACE) 2.5 MG capsule TAKE ONE CAPSULE BY MOUTH EVERY DAY 90 capsule 3  . sertraline (ZOLOFT) 50 MG tablet Take 1 tablet (50 mg total) by mouth 2 (two) times daily. 60 tablet 2  . tiZANidine (ZANAFLEX) 2 MG tablet Take 1 tablet (2 mg total) by mouth 3 (three) times daily. 270 tablet 1  . topiramate (TOPAMAX) 100 MG tablet TAKE 1 TABLET TWICE A DAY 180 tablet 0    Review of Systems: a complete, 10pt review of systems was completed with  pertinent positives and negatives as documented in the HPI.   Physical Exam: Vitals:   12/11/15 0418 12/11/15 0431  BP: 145/83 145/85  Pulse: 113 116  Resp: 26 25  Temp: 103 F (39.4 C) (!) 103.1 F (39.5 C)   Gen: Sleepy, arousable to voice, confused but answers some questions appropriately. Much of hx provided by daughter at the bedside.  H&N: normocephalic, atraumatic, EOMI, anicteric. Neck supple without mass or thyromegaly Chest: unlabored respirations, expiratory wheezes, regular/tachycardic with palpable distal pulses Abdomen: soft, distended, tender in RUQ, epigastrium, no peritoneal signs Extremities: warm, without edema, no deformities Neuro: grossly intact  CBC Latest Ref Rng & Units 12/10/2015 12/08/2015 12/07/2015  WBC 4.0 - 10.5 K/uL 11.2(H) 8.5 8.6  Hemoglobin 12.0 - 15.0 g/dL 11.4(L) 10.7(L) 11.1(L)  Hematocrit 36.0 - 46.0 % 34.9(L) 32.6(L) 34.1(L)  Platelets 150 - 400 K/uL 320 184 172    CMP Latest Ref Rng & Units 12/10/2015 12/08/2015 12/07/2015  Glucose 65 - 99 mg/dL 111(H) 113(H) 115(H)  BUN 6 - 20 mg/dL 6 <5(L) <5(L)  Creatinine 0.44 - 1.00 mg/dL 0.87 0.77 0.94  Sodium 135 - 145 mmol/L 133(L) 139 140  Potassium 3.5 - 5.1 mmol/L 3.2(L) 3.5 2.5(LL)  Chloride 101 - 111 mmol/L 105 111 110  CO2 22 - 32 mmol/L 18(L) 23 26  Calcium 8.9 - 10.3 mg/dL 8.2(L) 7.8(L) 7.8(L)  Total Protein 6.5 - 8.1 g/dL 6.5 - 5.4(L)  Total Bilirubin 0.3 - 1.2 mg/dL 0.9 - 1.0  Alkaline Phos 38 - 126 U/L 147(H) - 99  AST 15 - 41 U/L 34 - 85(H)  ALT 14 - 54 U/L 45 - 96(H)   Lactic acid 0.7 Troponin 0.00 EKG: Sinus tach  Lab Results  Component Value Date   INR 1.03 12/05/2015   INR 1.03 10/10/2013   INR 1.0 01/07/2007    Imaging: CLINICAL DATA:  Status post cholecystectomy 5 days ago, now with fever, intermittent confusion. Constipation.  EXAM: CT ABDOMEN AND PELVIS WITH CONTRAST  TECHNIQUE: Multidetector CT imaging of the abdomen and pelvis was performed using the  standard protocol following bolus administration of intravenous contrast.  CONTRAST:  17mL ISOVUE-300 IOPAMIDOL (ISOVUE-300) INJECTION 61%  COMPARISON:  CT abdomen and pelvis June 21, 2013  FINDINGS: LOWER CHEST: Partially imaged small to moderate pleural effusion. Enhancing RIGHT middle lobe and RIGHT lower lobe atelectasis. Trace LEFT pleural effusion and atelectasis. Heart size is normal. Mild coronary artery calcifications .  HEPATOBILIARY: Status post cholecystectomy. 7.2 x 3.8 cm  fluid collection within the gallbladder fossa, this is contiguous with 5.8 x 11.6 x 15.7 cm (volume = 550 cm^3) hepatic subcapsular fluid collection. Small amount of fluid extends to Morrison's pouch. Mass effect on the RIGHT lobe of the liver, trace intrahepatic biliary dilatation. No choledocholithiasis.  PANCREAS: Normal.  SPLEEN: Normal.  ADRENALS/URINARY TRACT: Kidneys are orthotopic, demonstrating symmetric enhancement. No nephrolithiasis, hydronephrosis or solid renal masses. The unopacified ureters are normal in course and caliber. Delayed imaging through the kidneys demonstrates symmetric prompt contrast excretion within the proximal urinary collecting system. Urinary bladder is partially distended and unremarkable. Normal adrenal glands.  STOMACH/BOWEL: The stomach, small and large bowel are normal in course and caliber without inflammatory changes. Mild amount of retained large bowel stool . Five pill like densities in the cecum. Normal appendix.  VASCULAR/LYMPHATIC: Aortoiliac vessels are normal in course and caliber, severe calcific atherosclerosis. Normal appendix. No lymphadenopathy by CT size criteria.  REPRODUCTIVE: Status post hysterectomy.  OTHER: Small amount of low-density free fluid in the pelvis. No intraperitoneal free air.  MUSCULOSKELETAL: Nonacute. Mild chronic L1 compression fracture. Severe L5-S1 degenerative disc and severe L5-S1 neural  foraminal narrowing.  IMPRESSION: Status post recent cholecystectomy with fluid collection within the gallbladder fossa contiguous with hepatic subcapsular 5.8 x 11.6 x 15.7 cm fluid collection compatible with biloma. Superimposed infection possible. Small amount of free fluid in the pelvis.  Small to moderate RIGHT pleural effusion, trace on the LEFT with compressive atelectasis.  Severe atherosclerosis.  Acute findings discussed with and reconfirmed by [facility] NANAVATI on 12/11/2015 at 4:30 am.  CLINICAL DATA:  History of gallbladder surgery last Wednesday. Developed fever and cough on Sunday with right-sided abdominal pain radiating up into chest.  EXAM: CHEST  2 VIEW  COMPARISON:  Prior chest radiographs dating back through 02/07/2012 FINDINGS: Low lung volumes since previous studies. Trace fluid in the minor fissure on the right with elevated appearance of the right hemidiaphragm which may in fact be due to a moderate-sized subpulmonic effusion. Slight blunting left lateral costophrenic angle as well presumably from a effusion. The heart and mediastinal contours are stable and within normal limits for size. Mild atherosclerosis of the aortic arch. The patient is status post ACDF of the lower cervical spine. Chronic L1 mild compression. IMPRESSION: Elevated appearance of the right hemidiaphragm which may in fact be due to a moderate subpulmonic pleural effusion as there is also some fluid in the right minor fissure and left costophrenic angle on current exam. Decubitus views (right-side-down) of the chest may be provide more information as to whether this represents pleural fluid or CT as deemed clinically necessary.  CLINICAL DATA:  Gallstones  EXAM: INTRAOPERATIVE CHOLANGIOGRAM TECHNIQUE: Cholangiographic images from the C-arm fluoroscopic device were submitted for interpretation post-operatively. Please see the procedural report for the amount of  contrast and the fluoroscopy time utilized. COMPARISON:  None. FINDINGS: Contrast fills the biliary tree and duodenum without filling defects in the common bile duct. There is some narrowing of the distal common bile duct of unknown significance. IMPRESSION: Patent biliary tree.  Electronically Signed   By: Marybelle Killings M.D.   On: 12/06/2015 13:12  Pathology: Diagnosis Gallbladder - ACUTE AND CHRONIC CHOLECYSTITIS. - SINGLE CHOLELITH PRESENT. - NO TUMOR SEEN. - SEE COMMENT. Microscopic Comment Of note, a prominent cystically dilated Rokitansky-Aschoff sinus is present within the specimen corresponding to the cyst-like nodule seen grossly. The findings are consistent with the above diagnosis. 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

## 2015-12-11 NOTE — ED Notes (Signed)
Paramedic student attempted IV x 1 without success. This RN attempted without success

## 2015-12-11 NOTE — Consult Note (Signed)
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 Romana Juniper  Supervising Physician: Corrie Mckusick  Patient Status: In-pt   HPI: [name] is an 65 y.o. female who recently underwent a lap chole with IOC on 12-06-15.  She remained in the hospital for 2 days post op secondary to pain and the need for post operative abx therapy.  She was still having some pain on the day of discharge, but her pain worsened at home.  She started running fevers of 102.  She denies nausea or vomiting.  She presented to the [facility] where she had a CT scan that revealed a large RUQ fluid collection, likely a biloma.  She has a mild leukocytosis of 11.4.  Her fever still persists as high as 103.  We have been asked to see her for drainage of this fluid collection.  Past Medical History:  Past Medical History:  Diagnosis Date  . Chronic kidney disease    stage 3  . GERD (gastroesophageal reflux disease)   . Hyperlipidemia   . Migraine headache   . Occipital neuralgia    Left  . Stroke [facility]) 10/2013    Past Surgical History:  Past Surgical History:  Procedure Laterality Date  . ABDOMINAL HYSTERECTOMY    . BLADDER REPAIR    . CARPAL TUNNEL RELEASE    . CERVICAL SPINE SURGERY    . CHOLECYSTECTOMY N/A 12/06/2015   Procedure: LAPAROSCOPIC CHOLECYSTECTOMY WITH  INTRAOPERATIVE CHOLANGIOGRAM;  Surgeon: Stark Klein, MD;  Location: [place];  Service: General;  Laterality: N/A;  . ELBOW SURGERY    . KNEE SURGERY    . LUNG SURGERY      Family History:  Family History  Problem Relation Age of Onset  . Cancer Mother 18    breast  . Alzheimer's disease Mother   . Cancer Brother   . Hyperlipidemia Brother   . Prostate cancer Father   . Lung cancer Father     Social History:  reports that she quit smoking about 4 years ago. Her smoking use included Cigarettes. She smoked 0.30 packs per day. She has never used smokeless tobacco. She reports that she does not drink  alcohol or use drugs.  Allergies:  Allergies  Allergen Reactions  . Shellfish Allergy Anaphylaxis and Other (See Comments)    All seafood    Medications: Mediations have been reviewed in Epic.  Likely has NOT taken her plavix since before her lap chole.  Please HPI for pertinent positives, otherwise complete 10 system ROS negative.  Mallampati Score: MD Evaluation Airway: WNL Heart: WNL Abdomen: WNL Chest/ Lungs: WNL ASA  Classification: 2 Mallampati/Airway Score: Two  Physical Exam: BP 133/68 (BP Location: Left Arm)   Pulse (!) 117   Temp (!) 101.2 F (38.4 C) (Oral) Comment: Notified RNShea Stakes  Resp (!) 28   Ht _0  (1.702 m)   Wt 166 lb (75.3 kg)   SpO2 96%   BMI 26.00 kg/m  Body mass index is 26 kg/m. General: pleasant, WD, WN white female who is laying in bed in NAD, but clearly looks ill. HEENT: head is normocephalic, atraumatic.  Sclera are noninjected.  PERRL.  Ears and nose without any masses or lesions.  Mouth is pink and moist Heart: regular rhythm, but slightly tachycardic.  Normal s1,s2. No obvious murmurs, gallops, or rubs noted.  Palpable radial pulses bilaterally Lungs: CTAB, no wheezes, rhonchi, or rales noted.  Respiratory effort nonlabored, but [place] in place with  O2 for assistance Abd: soft, tender in RUQ and on the right side mostly., ND, +BS, no masses, hernias, or organomegaly.  All incisions are c/d/i and healing well. Psych: A&Ox3 with an appropriate affect.   Labs: Results for orders placed or performed during the hospital encounter of 12/11/15 (from the past 48 hour(s))  Comprehensive metabolic panel     Status: Abnormal   Collection Time: 12/10/15  6:50 PM  Result Value Ref Range   Sodium 133 (L) 135 - 145 mmol/L   Potassium 3.2 (L) 3.5 - 5.1 mmol/L   Chloride 105 101 - 111 mmol/L   CO2 18 (L) 22 - 32 mmol/L   Glucose, Bld 111 (H) 65 - 99 mg/dL   BUN 6 6 - 20 mg/dL   Creatinine, Ser 0.87 0.44 - 1.00 mg/dL   Calcium 8.2 (L) 8.9 - 10.3  mg/dL   Total Protein 6.5 6.5 - 8.1 g/dL   Albumin 2.9 (L) 3.5 - 5.0 g/dL   AST 34 15 - 41 U/L   ALT 45 14 - 54 U/L   Alkaline Phosphatase 147 (H) 38 - 126 U/L   Total Bilirubin 0.9 0.3 - 1.2 mg/dL   GFR calc non Af Amer >60 >60 mL/min   GFR calc Af Amer >60 >60 mL/min    Comment: (NOTE) The eGFR has been calculated using the CKD EPI equation. This calculation has not been validated in all clinical situations. eGFR's persistently <60 mL/min signify possible Chronic Kidney Disease.    Anion gap 10 5 - 15  CBC with Differential     Status: Abnormal   Collection Time: 12/10/15  6:50 PM  Result Value Ref Range   WBC 11.2 (H) 4.0 - 10.5 K/uL   RBC 3.91 3.87 - 5.11 MIL/uL   Hemoglobin 11.4 (L) 12.0 - 15.0 g/dL   HCT 34.9 (L) 36.0 - 46.0 %   MCV 89.3 78.0 - 100.0 fL   MCH 29.2 26.0 - 34.0 pg   MCHC 32.7 30.0 - 36.0 g/dL   RDW 14.4 11.5 - 15.5 %   Platelets 320 150 - 400 K/uL   Neutrophils Relative % 83 %   Neutro Abs 9.1 (H) 1.7 - 7.7 K/uL   Lymphocytes Relative 9 %   Lymphs Abs 1.0 0.7 - 4.0 K/uL   Monocytes Relative 8 %   Monocytes Absolute 0.9 0.1 - 1.0 K/uL   Eosinophils Relative 0 %   Eosinophils Absolute 0.1 0.0 - 0.7 K/uL   Basophils Relative 0 %   Basophils Absolute 0.0 0.0 - 0.1 K/uL  I-Stat CG4 Lactic Acid, ED     Status: None   Collection Time: 12/10/15  7:25 PM  Result Value Ref Range   Lactic Acid, Venous 0.70 0.5 - 1.9 mmol/L  I-Stat beta hCG blood, ED     Status: None   Collection Time: 12/10/15  7:33 PM  Result Value Ref Range   I-stat hCG, quantitative <5.0 <5 mIU/mL   Comment 3            Comment:   GEST. AGE      CONC.  (mIU/mL)   <=1 WEEK        5 - 50     2 WEEKS       50 - 500     3 WEEKS       100 - 10,000     4 WEEKS     1,000 - 30,000  FEMALE AND NON-PREGNANT FEMALE:     LESS THAN 5 mIU/mL   Urinalysis, Routine w reflex microscopic     Status: Abnormal   Collection Time: 12/10/15  7:52 PM  Result Value Ref Range   Color, Urine YELLOW  YELLOW   APPearance CLEAR CLEAR   Specific Gravity, Urine 1.004 (L) 1.005 - 1.030   pH 6.0 5.0 - 8.0   Glucose, UA NEGATIVE NEGATIVE mg/dL   Hgb urine dipstick NEGATIVE NEGATIVE   Bilirubin Urine NEGATIVE NEGATIVE   Ketones, ur NEGATIVE NEGATIVE mg/dL   Protein, ur NEGATIVE NEGATIVE mg/dL   Nitrite NEGATIVE NEGATIVE   Leukocytes, UA NEGATIVE NEGATIVE    Comment: MICROSCOPIC NOT DONE ON URINES WITH NEGATIVE PROTEIN, BLOOD, LEUKOCYTES, NITRITE, OR GLUCOSE <1000 mg/dL.  I-stat troponin, ED     Status: None   Collection Time: 12/11/15  3:42 AM  Result Value Ref Range   Troponin i, poc 0.00 0.00 - 0.08 ng/mL   Comment 3            Comment: Due to the release kinetics of cTnI, a negative result within the first hours of the onset of symptoms does not rule out myocardial infarction with certainty. If myocardial infarction is still suspected, repeat the test at appropriate intervals.   MRSA PCR Screening     Status: None   Collection Time: 12/11/15  6:27 AM  Result Value Ref Range   MRSA by PCR NEGATIVE NEGATIVE    Comment:        The GeneXpert MRSA Assay (FDA approved for NASAL specimens only), is one component of a comprehensive MRSA colonization surveillance program. It is not intended to diagnose MRSA infection nor to guide or monitor treatment for MRSA infections.   Protime-INR     Status: None   Collection Time: 12/11/15  7:29 AM  Result Value Ref Range   Prothrombin Time 14.3 11.4 - 15.2 seconds   INR 1.10     Imaging: Dg Chest 2 View  Result Date: 12/10/2015 CLINICAL DATA:  History of gallbladder surgery last Wednesday. Developed fever and cough on Sunday with right-sided abdominal pain radiating up into chest. EXAM: CHEST  2 VIEW COMPARISON:  Prior chest radiographs dating back through 02/07/2012 FINDINGS: Low lung volumes since previous studies. Trace fluid in the minor fissure on the right with elevated appearance of the right hemidiaphragm which may in fact be due  to a moderate-sized subpulmonic effusion. Slight blunting left lateral costophrenic angle as well presumably from a effusion. The heart and mediastinal contours are stable and within normal limits for size. Mild atherosclerosis of the aortic arch. The patient is status post ACDF of the lower cervical spine. Chronic L1 mild compression. IMPRESSION: Elevated appearance of the right hemidiaphragm which may in fact be due to a moderate subpulmonic pleural effusion as there is also some fluid in the right minor fissure and left costophrenic angle on current exam. Decubitus views (right-side-down) of the chest may be provide more information as to whether this represents pleural fluid or CT as deemed clinically necessary. Electronically Signed   By: Ashley Royalty M.D.   On: 12/10/2015 19:45   Ct Abdomen Pelvis W Contrast  Result Date: 12/11/2015 CLINICAL DATA:  Status post cholecystectomy 5 days ago, now with fever, intermittent confusion. Constipation. EXAM: CT ABDOMEN AND PELVIS WITH CONTRAST TECHNIQUE: Multidetector CT imaging of the abdomen and pelvis was performed using the standard protocol following bolus administration of intravenous contrast. CONTRAST:  33m ISOVUE-300 IOPAMIDOL (ISOVUE-300) INJECTION  61% COMPARISON:  CT abdomen and pelvis June 21, 2013 FINDINGS: LOWER CHEST: Partially imaged small to moderate pleural effusion. Enhancing RIGHT middle lobe and RIGHT lower lobe atelectasis. Trace LEFT pleural effusion and atelectasis. Heart size is normal. Mild coronary artery calcifications . HEPATOBILIARY: Status post cholecystectomy. 7.2 x 3.8 cm fluid collection within the gallbladder fossa, this is contiguous with 5.8 x 11.6 x 15.7 cm (volume = 550 cm^3) hepatic subcapsular fluid collection. Small amount of fluid extends to Morrison's pouch. Mass effect on the RIGHT lobe of the liver, trace intrahepatic biliary dilatation. No choledocholithiasis. PANCREAS: Normal. SPLEEN: Normal. ADRENALS/URINARY TRACT:  Kidneys are orthotopic, demonstrating symmetric enhancement. No nephrolithiasis, hydronephrosis or solid renal masses. The unopacified ureters are normal in course and caliber. Delayed imaging through the kidneys demonstrates symmetric prompt contrast excretion within the proximal urinary collecting system. Urinary bladder is partially distended and unremarkable. Normal adrenal glands. STOMACH/BOWEL: The stomach, small and large bowel are normal in course and caliber without inflammatory changes. Mild amount of retained large bowel stool . Five pill like densities in the cecum. Normal appendix. VASCULAR/LYMPHATIC: Aortoiliac vessels are normal in course and caliber, severe calcific atherosclerosis. Normal appendix. No lymphadenopathy by CT size criteria. REPRODUCTIVE: Status post hysterectomy. OTHER: Small amount of low-density free fluid in the pelvis. No intraperitoneal free air. MUSCULOSKELETAL: Nonacute. Mild chronic L1 compression fracture. Severe L5-S1 degenerative disc and severe L5-S1 neural foraminal narrowing. IMPRESSION: Status post recent cholecystectomy with fluid collection within the gallbladder fossa contiguous with hepatic subcapsular 5.8 x 11.6 x 15.7 cm fluid collection compatible with biloma. Superimposed infection possible. Small amount of free fluid in the pelvis. Small to moderate RIGHT pleural effusion, trace on the LEFT with compressive atelectasis. Severe atherosclerosis. Acute findings discussed with and reconfirmed by [facility] NANAVATI on 12/11/2015 at 4:30 am. Electronically Signed   By: Elon Alas M.D.   On: 12/11/2015 04:33    Assessment/Plan 1. 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 -labs and vitals reviewed -patient is normally on plavix, but she thinks she did not resume this since being home and since has not had it since prior to before her surgery last week. -Risks  and Benefits discussed with the patient including bleeding, infection, damage to adjacent structures, and sepsis. All of the patient's questions were answered, patient is agreeable to proceed. Consent signed and in chart.  Thank you for this interesting consult.  I greatly enjoyed meeting KEISHIA GROUND and look forward to participating in their care.  A copy of this report was sent to the requesting provider on this date.  Electronically Signed: Henreitta Cea 12/11/2015, 10:12 AM   I spent a total of 40 Minutes    in face to face in clinical consultation, greater than 50% of which was counseling/coordinating care for intra-abdominal fluid collection

## 2015-12-12 ENCOUNTER — Inpatient Hospital Stay (HOSPITAL_COMMUNITY): Payer: BLUE CROSS/BLUE SHIELD | Admitting: Certified Registered Nurse Anesthetist

## 2015-12-12 ENCOUNTER — Encounter (HOSPITAL_COMMUNITY): Payer: Self-pay | Admitting: *Deleted

## 2015-12-12 ENCOUNTER — Encounter (HOSPITAL_COMMUNITY): Admission: EM | Disposition: A | Discharge status: Home Health | Payer: Self-pay | Source: Home / Self Care

## 2015-12-12 ENCOUNTER — Inpatient Hospital Stay (HOSPITAL_COMMUNITY): Payer: BLUE CROSS/BLUE SHIELD

## 2015-12-12 ENCOUNTER — Other Ambulatory Visit: Payer: Self-pay | Admitting: Family Medicine

## 2015-12-12 DIAGNOSIS — K9189 Other postprocedural complications and disorders of digestive system: Secondary | ICD-10-CM

## 2015-12-12 DIAGNOSIS — K838 Other specified diseases of biliary tract: Secondary | ICD-10-CM

## 2015-12-12 HISTORY — PX: BILIARY STENT PLACEMENT: SHX5538

## 2015-12-12 HISTORY — PX: ERCP: SHX5425

## 2015-12-12 LAB — COMPREHENSIVE METABOLIC PANEL
ALT: 28 U/L (ref 14–54)
AST: 27 U/L (ref 15–41)
Albumin: 2 g/dL — ABNORMAL LOW (ref 3.5–5.0)
Alkaline Phosphatase: 125 U/L (ref 38–126)
Anion gap: 8 (ref 5–15)
BUN: 10 mg/dL (ref 6–20)
CO2: 24 mmol/L (ref 22–32)
Calcium: 7.9 mg/dL — ABNORMAL LOW (ref 8.9–10.3)
Chloride: 107 mmol/L (ref 101–111)
Creatinine, Ser: 0.86 mg/dL (ref 0.44–1.00)
GFR calc Af Amer: >60 mL/min (ref >60)
GFR calc non Af Amer: >60 mL/min (ref >60)
Glucose, Bld: 97 mg/dL (ref 65–99)
Potassium: 3.4 mmol/L — ABNORMAL LOW (ref 3.5–5.1)
Sodium: 139 mmol/L (ref 135–145)
Total Bilirubin: 0.8 mg/dL (ref 0.3–1.2)
Total Protein: 5 g/dL — ABNORMAL LOW (ref 6.5–8.1)

## 2015-12-12 LAB — CBC
HCT: 30.6 % — ABNORMAL LOW (ref 36.0–46.0)
Hemoglobin: 9.9 g/dL — ABNORMAL LOW (ref 12.0–15.0)
MCH: 28.7 pg (ref 26.0–34.0)
MCHC: 32.4 g/dL (ref 30.0–36.0)
MCV: 88.7 fL (ref 78.0–100.0)
Platelets: 297 10*3/uL (ref 150–400)
RBC: 3.45 MIL/uL — ABNORMAL LOW (ref 3.87–5.11)
RDW: 14.6 % (ref 11.5–15.5)
WBC: 12.7 10*3/uL — ABNORMAL HIGH (ref 4.0–10.5)

## 2015-12-12 LAB — URINE CULTURE: Culture: NO GROWTH

## 2015-12-12 LAB — MAGNESIUM: Magnesium: 2.2 mg/dL (ref 1.7–2.4)

## 2015-12-12 SURGERY — ERCP, WITH INTERVENTION IF INDICATED
Anesthesia: General

## 2015-12-12 MED ORDER — SUCCINYLCHOLINE CHLORIDE 20 MG/ML IJ SOLN
INTRAMUSCULAR | Status: DC | PRN
  Administered 2015-12-12: 110 mg via INTRAVENOUS

## 2015-12-12 MED ORDER — PHENYLEPHRINE 40 MCG/ML (10ML) SYRINGE FOR IV PUSH (FOR BLOOD PRESSURE SUPPORT)
PREFILLED_SYRINGE | INTRAVENOUS | Status: DC | PRN
  Administered 2015-12-12 (×3): 40 ug via INTRAVENOUS

## 2015-12-12 MED ORDER — INDOMETHACIN 50 MG RE SUPP
RECTAL | Status: DC | PRN
  Administered 2015-12-12 (×2): 50 mg via RECTAL

## 2015-12-12 MED ORDER — INDOMETHACIN 50 MG RE SUPP
RECTAL | Status: AC
  Filled 2015-12-12: qty 2

## 2015-12-12 MED ORDER — SUGAMMADEX SODIUM 200 MG/2ML IV SOLN
INTRAVENOUS | Status: DC | PRN
  Administered 2015-12-12: 301.2 mg via INTRAVENOUS

## 2015-12-12 MED ORDER — LACTATED RINGERS IV SOLN
INTRAVENOUS | Status: DC | PRN
  Administered 2015-12-12: 13:00:00 via INTRAVENOUS

## 2015-12-12 MED ORDER — SODIUM CHLORIDE 0.9 % IV SOLN
INTRAVENOUS | Status: DC | PRN
  Administered 2015-12-12: 22 mL

## 2015-12-12 MED ORDER — IOPAMIDOL (ISOVUE-300) INJECTION 61%
INTRAVENOUS | Status: AC
  Filled 2015-12-12: qty 50

## 2015-12-12 MED ORDER — POTASSIUM CHLORIDE CRYS ER 10 MEQ PO TBCR
10.0000 meq | EXTENDED_RELEASE_TABLET | Freq: Two times a day (BID) | ORAL | Status: AC
  Administered 2015-12-12 (×2): 10 meq via ORAL
  Filled 2015-12-12 (×2): qty 1

## 2015-12-12 MED ORDER — LIDOCAINE 2% (20 MG/ML) 5 ML SYRINGE
INTRAMUSCULAR | Status: DC | PRN
  Administered 2015-12-12: 50 mg via INTRAVENOUS

## 2015-12-12 MED ORDER — PROPOFOL 10 MG/ML IV BOLUS
INTRAVENOUS | Status: DC | PRN
  Administered 2015-12-12: 130 mg via INTRAVENOUS

## 2015-12-12 MED ORDER — VANCOMYCIN HCL IN DEXTROSE 1-5 GM/200ML-% IV SOLN
1000.0000 mg | Freq: Two times a day (BID) | INTRAVENOUS | Status: DC
  Administered 2015-12-12 – 2015-12-14 (×5): 1000 mg via INTRAVENOUS
  Filled 2015-12-12 (×8): qty 200

## 2015-12-12 MED ORDER — ENOXAPARIN SODIUM 40 MG/0.4ML ~~LOC~~ SOLN
40.0000 mg | SUBCUTANEOUS | Status: DC
  Administered 2015-12-13 – 2015-12-15 (×3): 40 mg via SUBCUTANEOUS
  Filled 2015-12-12 (×3): qty 0.4

## 2015-12-12 MED ORDER — MEPERIDINE HCL 25 MG/ML IJ SOLN: 6.2500 mg | INTRAMUSCULAR | Status: DC | PRN

## 2015-12-12 MED ORDER — GLUCAGON HCL RDNA (DIAGNOSTIC) 1 MG IJ SOLR
INTRAMUSCULAR | Status: DC | PRN
  Administered 2015-12-12: .5 mg via INTRAVENOUS

## 2015-12-12 MED ORDER — GLUCAGON HCL RDNA (DIAGNOSTIC) 1 MG IJ SOLR
INTRAMUSCULAR | Status: AC
  Filled 2015-12-12: qty 1

## 2015-12-12 MED ORDER — LACTATED RINGERS IV SOLN: INTRAVENOUS | Status: DC

## 2015-12-12 MED ORDER — POTASSIUM CHLORIDE 10 MEQ/100ML IV SOLN
10.0000 meq | INTRAVENOUS | Status: AC
  Administered 2015-12-12: 10 meq via INTRAVENOUS
  Filled 2015-12-12: qty 100

## 2015-12-12 MED ORDER — FENTANYL CITRATE (PF) 100 MCG/2ML IJ SOLN
INTRAMUSCULAR | Status: DC | PRN
  Administered 2015-12-12: 50 ug via INTRAVENOUS

## 2015-12-12 MED ORDER — SODIUM CHLORIDE 0.9 % IV SOLN: INTRAVENOUS | Status: DC

## 2015-12-12 MED ORDER — PROMETHAZINE HCL 25 MG/ML IJ SOLN: 6.2500 mg | INTRAMUSCULAR | Status: DC | PRN

## 2015-12-12 MED ORDER — FENTANYL CITRATE (PF) 100 MCG/2ML IJ SOLN: 25.0000 ug | INTRAMUSCULAR | Status: DC | PRN

## 2015-12-12 MED ORDER — ROCURONIUM BROMIDE 10 MG/ML (PF) SYRINGE
PREFILLED_SYRINGE | INTRAVENOUS | Status: DC | PRN
  Administered 2015-12-12: 30 mg via INTRAVENOUS

## 2015-12-12 NOTE — Progress Notes (Signed)
Referring Physician(s): Dr Evlyn Courier  Supervising Physician: Sandi Mariscal  Patient Status:  Inpatient  Chief Complaint:  Biloma drain placed 10/3  Subjective:  Drain intact Draining well 100 cc in bag---no record of overnight For ERCP today- stent placement  Allergies: Shellfish allergy  Medications: Prior to Admission medications   Medication Sig Start Date End Date Taking? Authorizing Provider  atorvastatin (LIPITOR) 40 MG tablet Take 1 tablet (40 mg total) by mouth daily. 01/04/15  Yes Elberta Leatherwood, MD  clopidogrel (PLAVIX) 75 MG tablet TAKE 1 TABLET BY MOUTH EVERY DAY 09/04/15  Yes Elberta Leatherwood, MD  doxepin (SINEQUAN) 100 MG capsule Take 2 capsules (200 mg total) by mouth at bedtime. 10/15/14  Yes Kathrynn Ducking, MD  furosemide (LASIX) 20 MG tablet Take 10 mg by mouth daily. 05/09/14  Yes Historical Provider, MD  gabapentin (NEURONTIN) 600 MG tablet Take 1 tablet (600 mg total) by mouth 2 (two) times daily. 12/05/15  Yes Charlett Blake, MD  lidocaine (LIDODERM) 5 % Place 2 patches onto the skin daily. Remove & Discard patch within 12 hours or as directed by MD 11/28/15  Yes Charlett Blake, MD  meclizine (ANTIVERT) 25 MG tablet Take 1 tablet (25 mg total) by mouth 2 (two) times daily as needed for dizziness. 07/16/15  Yes Elberta Leatherwood, MD  ondansetron (ZOFRAN-ODT) 4 MG disintegrating tablet Take 1 tablet (4 mg total) by mouth every 6 (six) hours as needed for nausea. 12/08/15  Yes Jerrye Beavers, PA-C  oxyCODONE (OXY IR/ROXICODONE) 5 MG immediate release tablet Take 1-2 tablets (5-10 mg total) by mouth every 4 (four) hours as needed for moderate pain. 12/08/15  Yes Brooke A Miller, PA-C  ramipril (ALTACE) 2.5 MG capsule TAKE ONE CAPSULE BY MOUTH EVERY DAY 10/01/15  Yes Elberta Leatherwood, MD  sertraline (ZOLOFT) 50 MG tablet Take 1 tablet (50 mg total) by mouth 2 (two) times daily. 09/17/15  Yes Elberta Leatherwood, MD  tiZANidine (ZANAFLEX) 2 MG tablet Take 1 tablet (2 mg total) by  mouth 3 (three) times daily. 09/12/15  Yes Ward Givens, NP  topiramate (TOPAMAX) 100 MG tablet TAKE 1 TABLET TWICE A DAY 05/07/15  Yes Kathrynn Ducking, MD     Vital Signs: BP 96/65   Pulse 87   Temp 99.1 F (37.3 C) (Oral)   Resp (!) 34   Ht 5\' 7"  (1.702 m)   Wt 166 lb (75.3 kg)   SpO2 97%   BMI 26.00 kg/m   Physical Exam  Constitutional: She is oriented to person, place, and time.  Abdominal: Soft. Bowel sounds are normal. There is tenderness.  Musculoskeletal: Normal range of motion.  Neurological: She is alert and oriented to person, place, and time.  Skin: Skin is warm and dry.  Rt biloma drain intact Output bilious 100 cc in bag Site clean and dry Sl tender to touch  Nursing note and vitals reviewed.   Imaging: Dg Chest 2 View  Result Date: 12/10/2015 CLINICAL DATA:  History of gallbladder surgery last Wednesday. Developed fever and cough on Sunday with right-sided abdominal pain radiating up into chest. EXAM: CHEST  2 VIEW COMPARISON:  Prior chest radiographs dating back through 02/07/2012 FINDINGS: Low lung volumes since previous studies. Trace fluid in the minor fissure on the right with elevated appearance of the right hemidiaphragm which may in fact be due to a moderate-sized subpulmonic effusion. Slight blunting left lateral costophrenic angle as well presumably from a  effusion. The heart and mediastinal contours are stable and within normal limits for size. Mild atherosclerosis of the aortic arch. The patient is status post ACDF of the lower cervical spine. Chronic L1 mild compression. IMPRESSION: Elevated appearance of the right hemidiaphragm which may in fact be due to a moderate subpulmonic pleural effusion as there is also some fluid in the right minor fissure and left costophrenic angle on current exam. Decubitus views (right-side-down) of the chest may be provide more information as to whether this represents pleural fluid or CT as deemed clinically necessary.  Electronically Signed   By: Ashley Royalty M.D.   On: 12/10/2015 19:45   Nm Hepatobiliary Liver Func  Result Date: 12/11/2015 CLINICAL DATA:  65 year old female with a history of cholecystectomy and biloma formation. EXAM: NUCLEAR MEDICINE HEPATOBILIARY IMAGING TECHNIQUE: Sequential images of the abdomen were obtained out to 60 minutes following intravenous administration of radiopharmaceutical. RADIOPHARMACEUTICALS:  5.0 mCi Tc-71m  Choletec IV COMPARISON:  None. FINDINGS: Prompt, uniform hepatic uptake with biliary excretion. Activity within the biliary duct evident at approximately 15 minutes. No focal concentration of radiotracer at the inferior liver margin. IMPRESSION: No focal concentration of radiotracer at the inferior liver margin to confirm biliary leak on the current study. Signed, Dulcy Fanny. Earleen Newport, DO [facility] Electronically Signed   By: Corrie Mckusick D.O.   On: 12/11/2015 16:16   Ct Abdomen Pelvis W Contrast  Result Date: 12/11/2015 CLINICAL DATA:  Status post cholecystectomy 5 days ago, now with fever, intermittent confusion. Constipation. EXAM: CT ABDOMEN AND PELVIS WITH CONTRAST TECHNIQUE: Multidetector CT imaging of the abdomen and pelvis was performed using the standard protocol following bolus administration of intravenous contrast. CONTRAST:  13mL ISOVUE-300 IOPAMIDOL (ISOVUE-300) INJECTION 61% COMPARISON:  CT abdomen and pelvis June 21, 2013 FINDINGS: LOWER CHEST: Partially imaged small to moderate pleural effusion. Enhancing RIGHT middle lobe and RIGHT lower lobe atelectasis. Trace LEFT pleural effusion and atelectasis. Heart size is normal. Mild coronary artery calcifications . HEPATOBILIARY: Status post cholecystectomy. 7.2 x 3.8 cm fluid collection within the gallbladder fossa, this is contiguous with 5.8 x 11.6 x 15.7 cm (volume = 550 cm^3) hepatic subcapsular fluid collection. Small amount of fluid extends to Morrison's  pouch. Mass effect on the RIGHT lobe of the liver, trace intrahepatic biliary dilatation. No choledocholithiasis. PANCREAS: Normal. SPLEEN: Normal. ADRENALS/URINARY TRACT: Kidneys are orthotopic, demonstrating symmetric enhancement. No nephrolithiasis, hydronephrosis or solid renal masses. The unopacified ureters are normal in course and caliber. Delayed imaging through the kidneys demonstrates symmetric prompt contrast excretion within the proximal urinary collecting system. Urinary bladder is partially distended and unremarkable. Normal adrenal glands. STOMACH/BOWEL: The stomach, small and large bowel are normal in course and caliber without inflammatory changes. Mild amount of retained large bowel stool . Five pill like densities in the cecum. Normal appendix. VASCULAR/LYMPHATIC: Aortoiliac vessels are normal in course and caliber, severe calcific atherosclerosis. Normal appendix. No lymphadenopathy by CT size criteria. REPRODUCTIVE: Status post hysterectomy. OTHER: Small amount of low-density free fluid in the pelvis. No intraperitoneal free air. MUSCULOSKELETAL: Nonacute. Mild chronic L1 compression fracture. Severe L5-S1 degenerative disc and severe L5-S1 neural foraminal narrowing. IMPRESSION: Status post recent cholecystectomy with fluid collection within the gallbladder fossa contiguous with hepatic subcapsular 5.8 x 11.6 x 15.7 cm fluid collection compatible with biloma. Superimposed infection possible. Small amount of free fluid in the pelvis. Small to moderate RIGHT pleural effusion, trace on the LEFT with compressive atelectasis. Severe atherosclerosis. Acute findings discussed with  and reconfirmed by [facility] NANAVATI on 12/11/2015 at 4:30 am. Electronically Signed   By: Elon Alas M.D.   On: 12/11/2015 04:33   [place] Image Guided Drainage By Percutaneous Catheter  Result Date: 12/11/2015 INDICATION: 65 year old female with fluid collection after cholecystectomy. EXAM: [place] IMAGE GUIDED DRAINAGE BY  PERCUTANEOUS CATHETE MEDICATIONS: The patient is currently admitted to the hospital and receiving intravenous antibiotics. The antibiotics were administered within an appropriate time frame prior to the initiation of the procedure. ANESTHESIA/SEDATION: Fentanyl 50 mcg IV; Versed 1.0 mg IV Moderate Sedation Time:  15 minutes The patient was continuously monitored during the procedure by the interventional radiology nurse under my direct supervision. COMPLICATIONS: None PROCEDURE: Informed written consent was obtained from the patient after a thorough discussion of the procedural risks, benefits and alternatives. All questions were addressed. Maximal Sterile Barrier Technique was utilized including caps, mask, sterile gowns, sterile gloves, sterile drape, hand hygiene and skin antiseptic. A timeout was performed prior to the initiation of the procedure. Patient positioned supine position on the ultrasound table. Scout images of the upper abdomen were performed for planning purposes. Patient was then prepped and draped in the usual sterile fashion. The skin and subcutaneous tissues were generously infiltrated 1% lidocaine for local anesthesia. Small stab incision was made with 11 blade scalpel. Using ultrasound guidance, trocar technique was used to advance a 10 [place] drain into the subhepatic fluid collection. Once the drain was in place and confirmed in position, spontaneous ileus fluid returned. Approximately 290 cc of ileus fluid was aspirated. Sample was aspirated for the lab. Catheter was sutured in position and a final image was stored. Catheter attached to gravity drainage. Patient tolerated the procedure well and remained hemodynamically stable throughout. No complications were encountered and no significant blood loss encountered. IMPRESSION: Status post ultrasound-guided drainage of subhepatic fluid collection with approximately 290 cc of ileus fluid removed. Sample was sent the lab for culture. Signed,  Dulcy Fanny. Earleen Newport, DO [facility] Radiology Electronically Signed   By: Corrie Mckusick D.O.   On: 12/11/2015 15:03    Labs:  CBC:  Recent Labs  12/07/15 1240 12/08/15 0420 12/10/15 1850 12/12/15 0241  WBC 8.6 8.5 11.2* 12.7*  HGB 11.1* 10.7* 11.4* 9.9*  HCT 34.1* 32.6* 34.9* 30.6*  PLT 172 184 320 297    COAGS:  Recent Labs  12/05/15 0406 12/11/15 0729  INR 1.03 1.10  APTT 33  --     BMP:  Recent Labs  12/07/15 1240 12/08/15 0420 12/10/15 1850 12/12/15 0241  NA 140 139 133* 139  K 2.5* 3.5 3.2* 3.4*  CL 110 111 105 107  CO2 26 23 18* 24  GLUCOSE 115* 113* 111* 97  BUN <5* <5* 6 10  CALCIUM 7.8* 7.8* 8.2* 7.9*  CREATININE 0.94 0.77 0.87 0.86  GFRNONAA >60 >60 >60 >60  GFRAA >60 >60 >60 >60    LIVER FUNCTION TESTS:  Recent Labs  12/06/15 0602 12/07/15 1240 12/10/15 1850 12/12/15 0241  BILITOT 1.2 1.0 0.9 0.8  AST 99* 85* 34 27  ALT 100* 96* 45 28  ALKPHOS 86 99 147* 125  PROT 5.3* 5.4* 6.5 5.0*  ALBUMIN 3.1* 2.7* 2.9* 2.0*    Assessment and Plan:  Biloma post cholecystectomy Drain placed 10/4 in IR Draining well For ERCP/ bili stent today  Electronically Signed: Ezzie Senat A 12/12/2015, 9:13 AM   I spent a total of 15 Minutes at the the patient's bedside AND on the patient's hospital  floor or unit, greater than 50% of which was counseling/coordinating care for biloma drain

## 2015-12-12 NOTE — Interval H&P Note (Signed)
History and Physical Interval Note:  12/12/2015 12:56 PM  [name]  has presented today for surgery, with the diagnosis of bile  leak  The various methods of treatment have been discussed with the patient and family. After consideration of risks, benefits and other options for treatment, the patient has consented to  Procedure(s): ENDOSCOPIC RETROGRADE CHOLANGIOPANCREATOGRAPHY (ERCP) (N/A) BILIARY STENT PLACEMENT (N/A) as a surgical intervention .  The patient's history has been reviewed, patient examined, no change in status, stable for surgery.  I have reviewed the patient's chart and labs.  Questions were answered to the patient's satisfaction.     [name]

## 2015-12-12 NOTE — Progress Notes (Signed)
[facility] Electrolyte Replacement Protocol  Patient Name: [name] DOB: 27-Aug-1950 MRN: [id number]  Date of Service  12/12/2015   HPI/Events of Note    Recent Labs Lab 12/06/15 0602 12/07/15 1240 12/08/15 0420 12/10/15 1850 12/12/15 0241  NA 141 140 139 133* 139  K 3.0* 2.5* 3.5 3.2* 3.4*  CL 112* 110 111 105 107  CO2 25 26 23  18* 24  GLUCOSE 109* 115* 113* 111* 97  BUN 5* <5* <5* 6 10  CREATININE 1.07* 0.94 0.77 0.87 0.86  CALCIUM 7.7* 7.8* 7.8* 8.2* 7.9*  MG  --   --   --   --  2.2    Estimated Creatinine Clearance: 70 mL/min (by C-G formula based on SCr of 0.86 mg/dL).  Intake/Output      10/03 0701 - 10/04 0700   I.V. (mL/kg) 2500 (33.2)   IV Piggyback 150   Total Intake(mL/kg) 2650 (35.2)   Urine (mL/kg/hr) 60 (0)   Stool 0 (0)   Total Output 60   Net +2590       Urine Occurrence 8 x   Stool Occurrence 4 x    - I/O DETAILED x24h    Total I/O In: 1300 [I.V.:1250; IV Piggyback:50] Out: -  - I/O THIS SHIFT    ASSESSMENT   eICURN Interventions  Unable to replace using protocol due to unmeasured urine output   ASSESSMENT: MAJOR ELECTROLYTE    Lorene Dy 12/12/2015, 6:21 AM

## 2015-12-12 NOTE — Op Note (Signed)
[facility] Patient Name: [name] Procedure Date : 12/12/2015 MRN: [id number] Attending MD: Estill Cotta. Loletha Carrow , MD Date of Birth: 1951/01/15 CSN: [id number] Age: 65 Admit Type: Inpatient Procedure:                ERCP Indications:              Treatment of bile leak Providers:                Mallie Mussel L. Loletha Carrow, MD, Malka So, RN, Elspeth Cho Tech., Technician, Pearline Cables, CRNA Referring MD:              Medicines:                General Anesthesia, Indomethacin 100 mg PR, Patient                            on standing dose zosyn and vancomycin. Complications:            No immediate complications. Estimated Blood Loss:     Estimated blood loss: none. Procedure:                Pre-Anesthesia Assessment:                           - Prior to the procedure, a History and Physical                            was performed, and patient medications and                            allergies were reviewed. The patient's tolerance of                            previous anesthesia was also reviewed. The risks                            and benefits of the procedure and the sedation                            options and risks were discussed with the patient.                            All questions were answered, and informed consent                            was obtained. Prior Anticoagulants: The patient has                            taken Plavix (clopidogrel), last dose was 5 days                            prior to procedure. ASA Grade Assessment: III - A  patient with severe systemic disease. After                            reviewing the risks and benefits, the patient was                            deemed in satisfactory condition to undergo the                            procedure.                           After obtaining informed consent, the scope was                            passed under direct vision.  Throughout the                            procedure, the patient's blood pressure, pulse, and                            oxygen saturations were monitored continuously. The                            [id number]) scope was introduced through                            the mouth, and used to inject contrast into and                            used to inject contrast into the bile duct. The                            ERCP was accomplished without difficulty. The                            patient tolerated the procedure well. Scope In: Scope Out: Findings:      A scout film of the abdomen was obtained. Surgical clips, consistent       with a previous cholecystectomy, were seen in the area of the right       upper quadrant of the abdomen. The esophagus was successfully intubated       under direct vision. The scope was advanced to a normal major papilla in       the descending duodenum without detailed examination of the pharynx,       larynx and associated structures, and upper GI tract. The upper GI tract       was grossly normal. The bile duct was deeply cannulated with the       traction (standard) sphincterotome. Contrast was injected. I personally       interpreted the bile duct images. There was brisk flow of contrast       through the ducts. Image quality was excellent. Contrast extended to the       hepatic ducts. Extravasation of contrast originating from a right  intrahepatic branch was observed. There was no leak from the cystic duct       stump, where clips were in place and the drain catheter was seen. An       0.035 inch x 260 cm straight Hydra Jagwire was passed into the biliary       tree. One 10 Fr by 7 cm plastic stent with a single external flap and a       single internal flap was placed 6 cm into the common bile duct. Bile       flowed through the stent. The stent was in good position. Fluoroscopy       time 2 minutes, 57 seconds. Impression:                - A bile leak was found.                           - One plastic stent was placed into the common bile                            duct. Recommendation:           - Low fat diet.                           - Follow output from external drain.                           Remove biliary stent in 4-6 weeks depending upon                            clinical progress. Procedure Code(s):        --- Professional ---                           [phone], Endoscopic retrograde                            cholangiopancreatography (ERCP); with placement of                            endoscopic stent into biliary or pancreatic duct,                            including pre- and post-dilation and guide wire                            passage, when performed, including sphincterotomy,                            when performed, each stent Diagnosis Code(s):        --- Professional ---                           K83.9, Disease of biliary tract, unspecified                           K83.8, Other specified diseases of biliary tract CPT copyright 2016 American  Medical Association. All rights reserved. The codes documented in this report are preliminary and upon coder review may  be revised to meet current compliance requirements. Henry L. Loletha Carrow, MD 12/12/2015 2:21:57 PM This report has been signed electronically. Number of Addenda: 0

## 2015-12-12 NOTE — Progress Notes (Signed)
Subjective: Still with RUQ abdominal pain, weakness  Objective: Vital signs in last 24 hours: Temp:  [98.2 F (36.8 C)-101.5 F (38.6 C)] 99.1 F (37.3 C) (10/04 0801) Pulse Rate:  [85-112] 87 (10/04 0800) Resp:  [19-34] 34 (10/04 0800) BP: (96-137)/(58-75) 96/65 (10/04 0800) SpO2:  [94 %-100 %] 97 % (10/04 0800) Last BM Date: 12/11/15  Intake/Output from previous day: 10/03 0701 - 10/04 0700 In: 2900 [I.V.:2750; IV Piggyback:150] Out: 60 [Urine:60] Intake/Output this shift: Total I/O In: 125 [I.V.:125] Out: -   Exam: Awake and alert Lungs clear Abdomen soft, with mild tenderness RUQ Drain with bile Lab Results:   Recent Labs  12/10/15 1850 12/12/15 0241  WBC 11.2* 12.7*  HGB 11.4* 9.9*  HCT 34.9* 30.6*  PLT 320 297   BMET  Recent Labs  12/10/15 1850 12/12/15 0241  NA 133* 139  K 3.2* 3.4*  CL 105 107  CO2 18* 24  GLUCOSE 111* 97  BUN 6 10  CREATININE 0.87 0.86  CALCIUM 8.2* 7.9*   PT/INR  Recent Labs  12/11/15 0729  LABPROT 14.3  INR 1.10   ABG No results for input(s): PHART, HCO3 in the last 72 hours.  Invalid input(s): PCO2, PO2  Studies/Results: Dg Chest 2 View  Result Date: 12/10/2015 CLINICAL DATA:  History of gallbladder surgery last Wednesday. Developed fever and cough on Sunday with right-sided abdominal pain radiating up into chest. EXAM: CHEST  2 VIEW COMPARISON:  Prior chest radiographs dating back through 02/07/2012 FINDINGS: Low lung volumes since previous studies. Trace fluid in the minor fissure on the right with elevated appearance of the right hemidiaphragm which may in fact be due to a moderate-sized subpulmonic effusion. Slight blunting left lateral costophrenic angle as well presumably from a effusion. The heart and mediastinal contours are stable and within normal limits for size. Mild atherosclerosis of the aortic arch. The patient is status post ACDF of the lower cervical spine. Chronic L1 mild compression. IMPRESSION:  Elevated appearance of the right hemidiaphragm which may in fact be due to a moderate subpulmonic pleural effusion as there is also some fluid in the right minor fissure and left costophrenic angle on current exam. Decubitus views (right-side-down) of the chest may be provide more information as to whether this represents pleural fluid or CT as deemed clinically necessary. Electronically Signed   By: Ashley Royalty M.D.   On: 12/10/2015 19:45   Nm Hepatobiliary Liver Func  Result Date: 12/11/2015 CLINICAL DATA:  65 year old female with a history of cholecystectomy and biloma formation. EXAM: NUCLEAR MEDICINE HEPATOBILIARY IMAGING TECHNIQUE: Sequential images of the abdomen were obtained out to 60 minutes following intravenous administration of radiopharmaceutical. RADIOPHARMACEUTICALS:  5.0 mCi Tc-81m  Choletec IV COMPARISON:  None. FINDINGS: Prompt, uniform hepatic uptake with biliary excretion. Activity within the biliary duct evident at approximately 15 minutes. No focal concentration of radiotracer at the inferior liver margin. IMPRESSION: No focal concentration of radiotracer at the inferior liver margin to confirm biliary leak on the current study. Signed, Dulcy Fanny. Earleen Newport, DO [facility] Radiology Electronically Signed   By: Corrie Mckusick D.O.   On: 12/11/2015 16:16   Ct Abdomen Pelvis W Contrast  Result Date: 12/11/2015 CLINICAL DATA:  Status post cholecystectomy 5 days ago, now with fever, intermittent confusion. Constipation. EXAM: CT ABDOMEN AND PELVIS WITH CONTRAST TECHNIQUE: Multidetector CT imaging of the abdomen and pelvis was performed using the standard protocol following bolus administration of intravenous contrast. CONTRAST:  4mL ISOVUE-300  IOPAMIDOL (ISOVUE-300) INJECTION 61% COMPARISON:  CT abdomen and pelvis June 21, 2013 FINDINGS: LOWER CHEST: Partially imaged small to moderate pleural effusion. Enhancing RIGHT middle lobe and RIGHT lower  lobe atelectasis. Trace LEFT pleural effusion and atelectasis. Heart size is normal. Mild coronary artery calcifications . HEPATOBILIARY: Status post cholecystectomy. 7.2 x 3.8 cm fluid collection within the gallbladder fossa, this is contiguous with 5.8 x 11.6 x 15.7 cm (volume = 550 cm^3) hepatic subcapsular fluid collection. Small amount of fluid extends to Morrison's pouch. Mass effect on the RIGHT lobe of the liver, trace intrahepatic biliary dilatation. No choledocholithiasis. PANCREAS: Normal. SPLEEN: Normal. ADRENALS/URINARY TRACT: Kidneys are orthotopic, demonstrating symmetric enhancement. No nephrolithiasis, hydronephrosis or solid renal masses. The unopacified ureters are normal in course and caliber. Delayed imaging through the kidneys demonstrates symmetric prompt contrast excretion within the proximal urinary collecting system. Urinary bladder is partially distended and unremarkable. Normal adrenal glands. STOMACH/BOWEL: The stomach, small and large bowel are normal in course and caliber without inflammatory changes. Mild amount of retained large bowel stool . Five pill like densities in the cecum. Normal appendix. VASCULAR/LYMPHATIC: Aortoiliac vessels are normal in course and caliber, severe calcific atherosclerosis. Normal appendix. No lymphadenopathy by CT size criteria. REPRODUCTIVE: Status post hysterectomy. OTHER: Small amount of low-density free fluid in the pelvis. No intraperitoneal free air. MUSCULOSKELETAL: Nonacute. Mild chronic L1 compression fracture. Severe L5-S1 degenerative disc and severe L5-S1 neural foraminal narrowing. IMPRESSION: Status post recent cholecystectomy with fluid collection within the gallbladder fossa contiguous with hepatic subcapsular 5.8 x 11.6 x 15.7 cm fluid collection compatible with biloma. Superimposed infection possible. Small amount of free fluid in the pelvis. Small to moderate RIGHT pleural effusion, trace on the LEFT with compressive atelectasis. Severe  atherosclerosis. Acute findings discussed with and reconfirmed by [facility] NANAVATI on 12/11/2015 at 4:30 am. Electronically Signed   By: Elon Alas M.D.   On: 12/11/2015 04:33   [place] Image Guided Drainage By Percutaneous Catheter  Result Date: 12/11/2015 INDICATION: 65 year old female with fluid collection after cholecystectomy. EXAM: [place] IMAGE GUIDED DRAINAGE BY PERCUTANEOUS CATHETE MEDICATIONS: The patient is currently admitted to the hospital and receiving intravenous antibiotics. The antibiotics were administered within an appropriate time frame prior to the initiation of the procedure. ANESTHESIA/SEDATION: Fentanyl 50 mcg IV; Versed 1.0 mg IV Moderate Sedation Time:  15 minutes The patient was continuously monitored during the procedure by the interventional radiology nurse under my direct supervision. COMPLICATIONS: None PROCEDURE: Informed written consent was obtained from the patient after a thorough discussion of the procedural risks, benefits and alternatives. All questions were addressed. Maximal Sterile Barrier Technique was utilized including caps, mask, sterile gowns, sterile gloves, sterile drape, hand hygiene and skin antiseptic. A timeout was performed prior to the initiation of the procedure. Patient positioned supine position on the ultrasound table. Scout images of the upper abdomen were performed for planning purposes. Patient was then prepped and draped in the usual sterile fashion. The skin and subcutaneous tissues were generously infiltrated 1% lidocaine for local anesthesia. Small stab incision was made with 11 blade scalpel. Using ultrasound guidance, trocar technique was used to advance a 10 [place] drain into the subhepatic fluid collection. Once the drain was in place and confirmed in position, spontaneous ileus fluid returned. Approximately 290 cc of ileus fluid was aspirated. Sample was aspirated for the lab. Catheter was sutured in position and a final image was stored.  Catheter attached to gravity drainage. Patient tolerated the procedure well and remained hemodynamically stable throughout. No  complications were encountered and no significant blood loss encountered. IMPRESSION: Status post ultrasound-guided drainage of subhepatic fluid collection with approximately 290 cc of ileus fluid removed. Sample was sent the lab for culture. Signed, Dulcy Fanny. Earleen Newport, DO [facility] Radiology Electronically Signed   By: Corrie Mckusick D.O.   On: 12/11/2015 15:03    Anti-infectives: Anti-infectives    Start     Dose/Rate Route Frequency Ordered Stop   12/11/15 0800  piperacillin-tazobactam (ZOSYN) IVPB 3.375 g     3.375 g 12.5 mL/hr over 240 Minutes Intravenous Every 8 hours 12/11/15 0536     12/11/15 0045  piperacillin-tazobactam (ZOSYN) IVPB 3.375 g     3.375 g 100 mL/hr over 30 Minutes Intravenous  Once 12/11/15 0038 12/11/15 0339      Assessment/Plan: s/p Procedure(s): ENDOSCOPIC RETROGRADE CHOLANGIOPANCREATOGRAPHY (ERCP) (N/A) BILIARY STENT PLACEMENT (N/A)  Post op bile leak  For ERCP today. Will start Vanc given positive blood cultures   LOS: 1 day    Meena Barrantes A 12/12/2015

## 2015-12-12 NOTE — Transfer of Care (Signed)
Immediate Anesthesia Transfer of Care Note  Patient: [name]  Procedure(s) Performed: Procedure(s): ENDOSCOPIC RETROGRADE CHOLANGIOPANCREATOGRAPHY (ERCP) (N/A) BILIARY STENT PLACEMENT (N/A)  Patient Location: Endoscopy Unit  Anesthesia Type:General  Level of Consciousness: awake, alert , oriented and patient cooperative  Airway & Oxygen Therapy: Patient Spontanous Breathing and Patient connected to nasal cannula oxygen  Post-op Assessment: Report given to RN and Post -op Vital signs reviewed and stable  Post vital signs: Reviewed and stable  Last Vitals:  Vitals:   12/12/15 1212 12/12/15 1428  BP: (!) 135/59 (!) 173/87  Pulse: 87 (!) 103  Resp:  (!) 35  Temp: 37 C     Last Pain:  Vitals:   12/12/15 1428  TempSrc: Oral  PainSc:       Patients Stated Pain Goal: 2 ([id number])  Complications: No apparent anesthesia complications

## 2015-12-12 NOTE — Anesthesia Procedure Notes (Signed)
Procedure Name: Intubation Date/Time: 12/12/2015 1:21 PM Performed by: Everlean Cherry A Pre-anesthesia Checklist: Patient identified, Emergency Drugs available, Suction available and Patient being monitored Patient Re-evaluated:Patient Re-evaluated prior to inductionOxygen Delivery Method: Circle system utilized Preoxygenation: Pre-oxygenation with 100% oxygen Intubation Type: IV induction, Rapid sequence and Cricoid Pressure applied Laryngoscope Size: Miller and 2 Grade View: Grade I Tube type: Oral Tube size: 7.0 mm Number of attempts: 1 Airway Equipment and Method: Stylet Placement Confirmation: ETT inserted through vocal cords under direct vision,  positive ETCO2 and breath sounds checked- equal and bilateral Secured at: 22 cm Tube secured with: Tape Dental Injury: Teeth and Oropharynx as per pre-operative assessment

## 2015-12-12 NOTE — Progress Notes (Signed)
Pharmacy Antibiotic Note  [name] is a 65 y.o. female admitted on 12/11/2015 with bacteremia.  Pharmacy has been consulted for vancomycin in addition to Zosyn dosing.  Appears to be strep per rapid ID but surgery wants to add vancomycin until cultures result.   Plan: Vancomycin 1g IV every 12 hours -goal 15-20 mcg/mL. Continue Zosyn 3.375g IV every 8 hours - 4hr infusion. Suspect this is all streptococcus which is covered well by Zosyn - would be inclined to stop vancomycin soon if cultures remain consistent.  Follow-up culture results.   Height: 5\' 7"  (170.2 cm) Weight: 166 lb (75.3 kg) IBW/kg (Calculated) : 61.6  Temp (24hrs), Avg:99.2 F (37.3 C), Min:98.2 F (36.8 C), Max:101.5 F (38.6 C)   Recent Labs Lab 12/06/15 0602 12/07/15 1240 12/08/15 0420 12/10/15 1850 12/10/15 1925 12/12/15 0241  WBC 7.7 8.6 8.5 11.2*  --  12.7*  CREATININE 1.07* 0.94 0.77 0.87  --  0.86  LATICACIDVEN  --   --   --   --  0.70  --     Estimated Creatinine Clearance: 70 mL/min (by C-G formula based on SCr of 0.86 mg/dL).    Allergies  Allergen Reactions  . Shellfish Allergy Anaphylaxis and Other (See Comments)    All seafood    Antimicrobials this admission: 10/2 Zosyn >> 10/4 Vancomycin >>  Dose adjustments this admission:  na  Microbiology results:  10/2 BCx: Strep species (by BCID) 10/3 Abd Abscess: few GPC pairs, rare GNR 10/2 UCx: sent 10/3 MRSA PCR: negative Thank you for allowing pharmacy to be a part of this patient's care.  Sloan Leiter, PharmD, BCPS Clinical Pharmacist [phone] 12/12/2015 9:11 AM

## 2015-12-12 NOTE — H&P (View-Only) (Signed)
[facility] Consult: 3:32 PM 12/11/2015  LOS: 0 days    Referring Provider: Dr Jens Som Primary Care Physician: [facility]. Primary Gastroenterologist:  Dr. Earlean Shawl performed a colonoscopy about 10 years ago but she does not see him for any other reason.   Reason for Consultation:  Bile leak   HPI: [name] is a 65 y.o. female.  Hx ckd stage 3.  Migraines.  CVA.  S/p orthopedic surgeries.   Patient's meds include Plavix.  S/p 12/06/15 lap chole with biliary anatomy but CBD described as dilated on IOC in the surgical report. She discharged 2 days postop and required antibiotics post procedure. Pain persisted but got worse once she returned home. She developed fevers to 102. A CT scan was repeated today that shows a large fluid collection in the right upper quadrant, probably a biloma. Temperature measures up to 103 here at the hospital.  This afternoon interventional radiology placed a drain into the biloma resulting in immediate discharge of 290 mL of fluid.  HIDA scan does not confirm active leak at time of study..  Her alkaline phosphatase is 147. Transaminases and total bilirubin are normal. Albumin slightly low at 2.9. Both sodium and potassium are low. GFR normal.  WBC count elevated at 11.2.  Hemoglobin slightly reduced at 11.4.    Past Medical History:  Diagnosis Date  . Chronic kidney disease    stage 3  . GERD (gastroesophageal reflux disease)   . Hyperlipidemia   . Migraine headache   . Occipital neuralgia    Left  . Stroke [facility]) 10/2013    Past Surgical History:  Procedure Laterality Date  . ABDOMINAL HYSTERECTOMY    . BLADDER REPAIR    . CARPAL TUNNEL RELEASE    . CERVICAL SPINE SURGERY    . CHOLECYSTECTOMY N/A 12/06/2015   Procedure: LAPAROSCOPIC  CHOLECYSTECTOMY WITH  INTRAOPERATIVE CHOLANGIOGRAM;  Surgeon: Stark Klein, MD;  Location: [facility];  Service: General;  Laterality: N/A;  . ELBOW SURGERY    . KNEE SURGERY    . LUNG SURGERY      Prior to Admission medications   Medication Sig Start Date End Date Taking? Authorizing Provider  atorvastatin (LIPITOR) 40 MG tablet Take 1 tablet (40 mg total) by mouth daily. 01/04/15  Yes Elberta Leatherwood, MD  clopidogrel (PLAVIX) 75 MG tablet TAKE 1 TABLET BY MOUTH EVERY DAY 09/04/15  Yes Elberta Leatherwood, MD  doxepin (SINEQUAN) 100 MG capsule Take 2 capsules (200 mg total) by mouth at bedtime. 10/15/14  Yes Kathrynn Ducking, MD  furosemide (LASIX) 20 MG tablet Take 10 mg by mouth daily. 05/09/14  Yes Historical Provider, MD  gabapentin (NEURONTIN) 600 MG tablet Take 1 tablet (600 mg total) by mouth 2 (two) times daily. 12/05/15  Yes Charlett Blake, MD  lidocaine (LIDODERM) 5 % Place 2 patches onto the skin daily. Remove & Discard patch within 12 hours or as directed by MD 11/28/15  Yes Charlett Blake, MD  meclizine (ANTIVERT) 25 MG tablet Take 1 tablet (  25 mg total) by mouth 2 (two) times daily as needed for dizziness. 07/16/15  Yes Elberta Leatherwood, MD  ondansetron (ZOFRAN-ODT) 4 MG disintegrating tablet Take 1 tablet (4 mg total) by mouth every 6 (six) hours as needed for nausea. 12/08/15  Yes Jerrye Beavers, PA-C  oxyCODONE (OXY IR/ROXICODONE) 5 MG immediate release tablet Take 1-2 tablets (5-10 mg total) by mouth every 4 (four) hours as needed for moderate pain. 12/08/15  Yes Brooke A Miller, PA-C  ramipril (ALTACE) 2.5 MG capsule TAKE ONE CAPSULE BY MOUTH EVERY DAY 10/01/15  Yes Elberta Leatherwood, MD  sertraline (ZOLOFT) 50 MG tablet Take 1 tablet (50 mg total) by mouth 2 (two) times daily. 09/17/15  Yes Elberta Leatherwood, MD  tiZANidine (ZANAFLEX) 2 MG tablet Take 1 tablet (2 mg total) by mouth 3 (three) times daily. 09/12/15  Yes Ward Givens, NP  topiramate (TOPAMAX) 100 MG tablet TAKE 1 TABLET TWICE A DAY 05/07/15  Yes  Kathrynn Ducking, MD    Scheduled Meds: . docusate sodium  100 mg Oral BID  . enoxaparin (LOVENOX) injection  40 mg Subcutaneous Daily  . fentaNYL      . lidocaine      . midazolam      . piperacillin-tazobactam (ZOSYN)  IV  3.375 g Intravenous Q8H  . senna  1 tablet Oral BID   Infusions: . sodium chloride 125 mL/hr at 12/11/15 0552   PRN Meds: acetaminophen **OR** acetaminophen, bisacodyl, HYDROmorphone (DILAUDID) injection, ibuprofen, ondansetron **OR** ondansetron (ZOFRAN) IV, technetium TC 71M mebrofenin   Allergies as of 12/10/2015 - Review Complete 12/10/2015  Allergen Reaction Noted  . Shellfish allergy Anaphylaxis and Other (See Comments) 08/03/2012    Family History  Problem Relation Age of Onset  . Cancer Mother 70    breast  . Alzheimer's disease Mother   . Cancer Brother   . Hyperlipidemia Brother   . Prostate cancer Father   . Lung cancer Father     Social History   Social History  . Marital status: Married    Spouse name: [name]  . Number of children: 1  . Years of education: 12+   Occupational History  . [employer]  . CNA    Social History Main Topics  . Smoking status: Former Smoker    Packs/day: 0.30    Types: Cigarettes    Quit date: 11/01/2011  . Smokeless tobacco: Never Used  . Alcohol use No  . Drug use: No  . Sexual activity: Yes    Partners: Male    Birth control/ protection: Surgical     Comment: 1 sexual partner in last 12 months: married   Other Topics Concern  . Not on file   Social History Narrative   Patient is married ([name]).   Patient has one child.   Patient is working full-time.   Patient is right handed.   Patient has a college education.   Patient does not drink caffeine.    REVIEW OF SYSTEMS: Constitutional:  Fatigue, weakness. ENT:  No nose bleeds Pulm:  When she takes a deep breath, the abdominal pain feels worse. She is not coughing. CV:  No palpitations, no LE edema.  GU:  No  hematuria, no frequency GI:  Prior to the acute onset of her gallbladder symptoms a little more than a week ago, she had no real GI problems. Heme:  No unusual bleeding or bruising.   Transfusions:  None Neuro:  No headaches,  no peripheral tingling or numbness Derm:  No itching, no rash or sores.  Endocrine:  No sweats or chills.  No polyuria or dysuria Immunization:  Did not inquire as to recent immunizations   PHYSICAL EXAM: Vital signs in last 24 hours: Vitals:   12/11/15 1345 12/11/15 1404  BP: 123/64 127/67  Pulse: (!) 105 (!) 107  Resp: (!) 25 (!) 21  Temp:     Wt Readings from Last 3 Encounters:  12/10/15 75.3 kg (166 lb)  12/05/15 75.5 kg (166 lb 6.4 oz)  11/01/15 72.9 kg (160 lb 12 oz)    General: Pleasant, hand older white female. She looks uncomfortable and ill. Head:  No facial asymmetry or swelling. Her face is red (from rosacea she says) Eyes:  No scleral icterus. No conjunctival pallor Ears:  Not hard of hearing.  Nose:  No congestion or nasal discharge. Mouth:  Good dentition. Mucous membranes slightly dry but clear. Neck:  No JVD, TMG or masses. Lungs:  Auscultation is clear bilaterally. Somewhat reduced breath sounds bilaterally. Heart: Tachycardia. Regular rhythm. No murmurs rubs or gallops. Abdomen:  Soft. Hypoactive bowel sounds. Some tenderness over the abdomen especially near the drain.  Biloma drain reveals clear, dark, bilious material.  Rectal:  deferred   Musc/Skeltl:  no joint redness, swelling or gross deformities. Extremities:   No CCE.  Neurologic:   No limb weakness. She is alert and oriented 3. No tremor. Skin:   No rashes or sores. No telangiectasia. Skin is very tanned but not obviously jaundiced. Tattoos:   None observed. Nodes:   No cervical adenopathy   Psych:   Affect depressed, calm.  Intake/Output from previous day: 10/02 0701 - 10/03 0700 In: 2191.7 [I.V.:141.7; IV Piggyback:2050] Out: -  Intake/Output this shift: Total  I/O In: 175 [I.V.:125; IV Piggyback:50] Out: 60 [Urine:60]  LAB RESULTS:  Recent Labs  12/10/15 1850  WBC 11.2*  HGB 11.4*  HCT 34.9*  PLT 320   BMET Lab Results  Component Value Date   NA 133 (L) 12/10/2015   NA 139 12/08/2015   NA 140 12/07/2015   K 3.2 (L) 12/10/2015   K 3.5 12/08/2015   K 2.5 (LL) 12/07/2015   CL 105 12/10/2015   CL 111 12/08/2015   CL 110 12/07/2015   CO2 18 (L) 12/10/2015   CO2 23 12/08/2015   CO2 26 12/07/2015   GLUCOSE 111 (H) 12/10/2015   GLUCOSE 113 (H) 12/08/2015   GLUCOSE 115 (H) 12/07/2015   BUN 6 12/10/2015   BUN <5 (L) 12/08/2015   BUN <5 (L) 12/07/2015   CREATININE 0.87 12/10/2015   CREATININE 0.77 12/08/2015   CREATININE 0.94 12/07/2015   CALCIUM 8.2 (L) 12/10/2015   CALCIUM 7.8 (L) 12/08/2015   CALCIUM 7.8 (L) 12/07/2015   LFT  Recent Labs  12/10/15 1850  PROT 6.5  ALBUMIN 2.9*  AST 34  ALT 45  ALKPHOS 147*  BILITOT 0.9   PT/INR Lab Results  Component Value Date   INR 1.10 12/11/2015   INR 1.03 12/05/2015   INR 1.03 10/10/2013   Hepatitis Panel No results for input(s): HEPBSAG, HCVAB, HEPAIGM, HEPBIGM in the last 72 hours. C-Diff No components found for: CDIFF Lipase     Component Value Date/Time   LIPASE 27 12/04/2015 1602    Drugs of Abuse     Component Value Date/Time   LABOPIA NONE DETECTED 10/10/2013 1624   COCAINSCRNUR NONE DETECTED 10/10/2013 1624   LABBENZ POSITIVE (A) 10/10/2013 1624  AMPHETMU NONE DETECTED 10/10/2013 1624   THCU NONE DETECTED 10/10/2013 1624   LABBARB NONE DETECTED 10/10/2013 1624     RADIOLOGY STUDIES: Dg Chest 2 View  Result Date: 12/10/2015 CLINICAL DATA:  History of gallbladder surgery last Wednesday. Developed fever and cough on Sunday with right-sided abdominal pain radiating up into chest. EXAM: CHEST  2 VIEW COMPARISON:  Prior chest radiographs dating back through 02/07/2012 FINDINGS: Low lung volumes since previous studies. Trace fluid in the minor fissure on  the right with elevated appearance of the right hemidiaphragm which may in fact be due to a moderate-sized subpulmonic effusion. Slight blunting left lateral costophrenic angle as well presumably from a effusion. The heart and mediastinal contours are stable and within normal limits for size. Mild atherosclerosis of the aortic arch. The patient is status post ACDF of the lower cervical spine. Chronic L1 mild compression. IMPRESSION: Elevated appearance of the right hemidiaphragm which may in fact be due to a moderate subpulmonic pleural effusion as there is also some fluid in the right minor fissure and left costophrenic angle on current exam. Decubitus views (right-side-down) of the chest may be provide more information as to whether this represents pleural fluid or CT as deemed clinically necessary. Electronically Signed   By: Ashley Royalty M.D.   On: 12/10/2015 19:45   Ct Abdomen Pelvis W Contrast  Result Date: 12/11/2015 CLINICAL DATA:  Status post cholecystectomy 5 days ago, now with fever, intermittent confusion. Constipation. EXAM: CT ABDOMEN AND PELVIS WITH CONTRAST TECHNIQUE: Multidetector CT imaging of the abdomen and pelvis was performed using the standard protocol following bolus administration of intravenous contrast. CONTRAST:  59mL ISOVUE-300 IOPAMIDOL (ISOVUE-300) INJECTION 61% COMPARISON:  CT abdomen and pelvis June 21, 2013 FINDINGS: LOWER CHEST: Partially imaged small to moderate pleural effusion. Enhancing RIGHT middle lobe and RIGHT lower lobe atelectasis. Trace LEFT pleural effusion and atelectasis. Heart size is normal. Mild coronary artery calcifications . HEPATOBILIARY: Status post cholecystectomy. 7.2 x 3.8 cm fluid collection within the gallbladder fossa, this is contiguous with 5.8 x 11.6 x 15.7 cm (volume = 550 cm^3) hepatic subcapsular fluid collection. Small amount of fluid extends to Morrison's pouch. Mass effect on the RIGHT lobe of the liver, trace intrahepatic biliary  dilatation. No choledocholithiasis. PANCREAS: Normal. SPLEEN: Normal. ADRENALS/URINARY TRACT: Kidneys are orthotopic, demonstrating symmetric enhancement. No nephrolithiasis, hydronephrosis or solid renal masses. The unopacified ureters are normal in course and caliber. Delayed imaging through the kidneys demonstrates symmetric prompt contrast excretion within the proximal urinary collecting system. Urinary bladder is partially distended and unremarkable. Normal adrenal glands. STOMACH/BOWEL: The stomach, small and large bowel are normal in course and caliber without inflammatory changes. Mild amount of retained large bowel stool . Five pill like densities in the cecum. Normal appendix. VASCULAR/LYMPHATIC: Aortoiliac vessels are normal in course and caliber, severe calcific atherosclerosis. Normal appendix. No lymphadenopathy by CT size criteria. REPRODUCTIVE: Status post hysterectomy. OTHER: Small amount of low-density free fluid in the pelvis. No intraperitoneal free air. MUSCULOSKELETAL: Nonacute. Mild chronic L1 compression fracture. Severe L5-S1 degenerative disc and severe L5-S1 neural foraminal narrowing. IMPRESSION: Status post recent cholecystectomy with fluid collection within the gallbladder fossa contiguous with hepatic subcapsular 5.8 x 11.6 x 15.7 cm fluid collection compatible with biloma. Superimposed infection possible. Small amount of free fluid in the pelvis. Small to moderate RIGHT pleural effusion, trace on the LEFT with compressive atelectasis. Severe atherosclerosis. Acute findings discussed with and reconfirmed by [facility] NANAVATI on 12/11/2015 at 4:30 am. Electronically Signed  By: Elon Alas M.D.   On: 12/11/2015 04:33   [place] Image Guided Drainage By Percutaneous Catheter  Result Date: 12/11/2015 INDICATION: 65 year old female with fluid collection after cholecystectomy. EXAM: [place] IMAGE GUIDED DRAINAGE BY PERCUTANEOUS CATHETE MEDICATIONS: The patient is currently admitted to the  hospital and receiving intravenous antibiotics. The antibiotics were administered within an appropriate time frame prior to the initiation of the procedure. ANESTHESIA/SEDATION: Fentanyl 50 mcg IV; Versed 1.0 mg IV Moderate Sedation Time:  15 minutes The patient was continuously monitored during the procedure by the interventional radiology nurse under my direct supervision. COMPLICATIONS: None PROCEDURE: Informed written consent was obtained from the patient after a thorough discussion of the procedural risks, benefits and alternatives. All questions were addressed. Maximal Sterile Barrier Technique was utilized including caps, mask, sterile gowns, sterile gloves, sterile drape, hand hygiene and skin antiseptic. A timeout was performed prior to the initiation of the procedure. Patient positioned supine position on the ultrasound table. Scout images of the upper abdomen were performed for planning purposes. Patient was then prepped and draped in the usual sterile fashion. The skin and subcutaneous tissues were generously infiltrated 1% lidocaine for local anesthesia. Small stab incision was made with 11 blade scalpel. Using ultrasound guidance, trocar technique was used to advance a 10 [place] drain into the subhepatic fluid collection. Once the drain was in place and confirmed in position, spontaneous ileus fluid returned. Approximately 290 cc of ileus fluid was aspirated. Sample was aspirated for the lab. Catheter was sutured in position and a final image was stored. Catheter attached to gravity drainage. Patient tolerated the procedure well and remained hemodynamically stable throughout. No complications were encountered and no significant blood loss encountered. IMPRESSION: Status post ultrasound-guided drainage of subhepatic fluid collection with approximately 290 cc of ileus fluid removed. Sample was sent the lab for culture. Signed, Dulcy Fanny. Earleen Newport, DO [facility]  [facility] Electronically Signed   By: Corrie Mckusick D.O.   On: 12/11/2015 15:03    ENDOSCOPIC STUDIES: None  IMPRESSION:   *  Post laparoscopic cholecystectomy biloma. Presumed biliary leak.  *  6 days status post laparoscopic cholecystectomy.  *  Atherosclerosis sclerosis, "severe" per abdominal/pelvic CT imaging earlier today. Takes Plavix for history of CVA. It's been several days since she took Plavix, she did not resume it following surgery.  *  Pleural effusions bil per CT scan.   PLAN:     *  Dr. Silverio Decamp as well as Dr. Rachael Darby Will review the case with forthcoming plans. Have placed orders in the endoscopy unit depot for ERCP and stent placement in case these need to be scheduled. Patient is now receiving Zosyn and is nothing by mouth.  *  Discontinue the PRN ibuprofen. Dilaudid available for pain and ibuprofen could cause renal injury as well as GI bleeding.  Will add scheduled famotidine.   Azucena Freed  12/11/2015, 3:32 PM Pager: [phone]

## 2015-12-12 NOTE — Anesthesia Preprocedure Evaluation (Addendum)
Anesthesia Evaluation  Patient identified by MRN, date of birth, ID band Patient awake    Reviewed: Allergy & Precautions, NPO status , Patient's Chart, lab work & pertinent test results  Airway Mallampati: III  TM Distance: >3 FB Neck ROM: Full    Dental  (+) Partial Lower, Partial Upper   Pulmonary former smoker,     + decreased breath sounds      Cardiovascular hypertension, Pt. on medications  Rhythm:Regular Rate:Normal     Neuro/Psych  Headaches, PSYCHIATRIC DISORDERS TIA Neuromuscular disease CVA    GI/Hepatic Neg liver ROS, GERD  ,  Endo/Other  negative endocrine ROS  Renal/GU CRFRenal disease  negative genitourinary   Musculoskeletal negative musculoskeletal ROS (+)   Abdominal   Peds negative pediatric ROS (+)  Hematology negative hematology ROS (+)   Anesthesia Other Findings   Reproductive/Obstetrics negative OB ROS                            Lab Results  Component Value Date   WBC 12.7 (H) 12/12/2015   HGB 9.9 (L) 12/12/2015   HCT 30.6 (L) 12/12/2015   MCV 88.7 12/12/2015   PLT 297 12/12/2015   Lab Results  Component Value Date   CREATININE 0.86 12/12/2015   BUN 10 12/12/2015   NA 139 12/12/2015   K 3.4 (L) 12/12/2015   CL 107 12/12/2015   CO2 24 12/12/2015   Lab Results  Component Value Date   INR 1.10 12/11/2015   INR 1.03 12/05/2015   INR 1.03 10/10/2013   EKG: sinus tachycardia.  Anesthesia Physical Anesthesia Plan  ASA: III  Anesthesia Plan: General   Post-op Pain Management:    Induction: Intravenous, Rapid sequence and Cricoid pressure planned  Airway Management Planned: Oral ETT  Additional Equipment:   Intra-op Plan:   Post-operative Plan: Extubation in OR  Informed Consent: I have reviewed the patients History and Physical, chart, labs and discussed the procedure including the risks, benefits and alternatives for the proposed  anesthesia with the patient or authorized representative who has indicated his/her understanding and acceptance.   Dental advisory given  Plan Discussed with: CRNA  Anesthesia Plan Comments:        Anesthesia Quick Evaluation

## 2015-12-12 NOTE — Progress Notes (Signed)
IV placed by IV team today, patient complains of burning during potassium administration. Infusion stopped. MD notified to switch potassium to PO and IV team consult placed to evaluate IV site. 6N RN made aware. Patient transferred to room 6N15.

## 2015-12-13 ENCOUNTER — Encounter (HOSPITAL_COMMUNITY): Payer: Self-pay | Admitting: Gastroenterology

## 2015-12-13 LAB — CBC
HCT: 31.4 % — ABNORMAL LOW (ref 36.0–46.0)
Hemoglobin: 10.3 g/dL — ABNORMAL LOW (ref 12.0–15.0)
MCH: 28.6 pg (ref 26.0–34.0)
MCHC: 32.8 g/dL (ref 30.0–36.0)
MCV: 87.2 fL (ref 78.0–100.0)
Platelets: 373 10*3/uL (ref 150–400)
RBC: 3.6 MIL/uL — ABNORMAL LOW (ref 3.87–5.11)
RDW: 14.5 % (ref 11.5–15.5)
WBC: 11.4 10*3/uL — ABNORMAL HIGH (ref 4.0–10.5)

## 2015-12-13 LAB — COMPREHENSIVE METABOLIC PANEL
ALT: 26 U/L (ref 14–54)
AST: 30 U/L (ref 15–41)
Albumin: 1.9 g/dL — ABNORMAL LOW (ref 3.5–5.0)
Alkaline Phosphatase: 125 U/L (ref 38–126)
Anion gap: 12 (ref 5–15)
BUN: 9 mg/dL (ref 6–20)
CO2: 23 mmol/L (ref 22–32)
Calcium: 7.6 mg/dL — ABNORMAL LOW (ref 8.9–10.3)
Chloride: 105 mmol/L (ref 101–111)
Creatinine, Ser: 0.77 mg/dL (ref 0.44–1.00)
GFR calc Af Amer: >60 mL/min (ref >60)
GFR calc non Af Amer: >60 mL/min (ref >60)
Glucose, Bld: 111 mg/dL — ABNORMAL HIGH (ref 65–99)
Potassium: 2.7 mmol/L — CL (ref 3.5–5.1)
Sodium: 140 mmol/L (ref 135–145)
Total Bilirubin: 0.8 mg/dL (ref 0.3–1.2)
Total Protein: 5.3 g/dL — ABNORMAL LOW (ref 6.5–8.1)

## 2015-12-13 LAB — LIPASE, BLOOD: Lipase: 3460 U/L — ABNORMAL HIGH (ref 11–51)

## 2015-12-13 MED ORDER — POTASSIUM CHLORIDE CRYS ER 20 MEQ PO TBCR
40.0000 meq | EXTENDED_RELEASE_TABLET | Freq: Two times a day (BID) | ORAL | Status: AC
  Administered 2015-12-13 (×2): 40 meq via ORAL
  Filled 2015-12-13 (×2): qty 2

## 2015-12-13 MED ORDER — SODIUM CHLORIDE 0.9 % IV SOLN
INTRAVENOUS | Status: DC
  Administered 2015-12-13: 17:00:00 via INTRAVENOUS

## 2015-12-13 NOTE — Progress Notes (Signed)
Patient ID: [name], female   DOB: 07-05-1950, 65 y.o.   MRN: [id number]  [facility] Surgery Progress Note  1 Day Post-Op  Subjective: Continued RLQ abdominal pain, improved from yesterday. Decreased appetite but tolerating diet well. Denies SOB. No fevers over night.  Objective: Vital signs in last 24 hours: Temp:  [98.3 F (36.8 C)-99.5 F (37.5 C)] 98.5 F (36.9 C) (10/05 0600) Pulse Rate:  [81-103] 87 (10/05 0600) Resp:  [16-35] 17 (10/05 0600) BP: (117-173)/(59-93) 135/74 (10/05 0600) SpO2:  [94 %-98 %] 97 % (10/05 0600) Last BM Date: 12/12/15  Intake/Output from previous day: 10/04 0701 - 10/05 0700 In: 3735.8 [P.O.:460; I.V.:2770.8; IV Piggyback:500] Out: 340 [Urine:220; Drains:120] Intake/Output this shift: No intake/output data recorded.  PE: Gen:  Alert, NAD, pleasant Card:  RRR, no M/G/R heard Pulm:  CTAB Abd: Soft, ND, +BS, incisions C/D/I, drain site looks good and has minimal yellow/slightly cloudy fluid  Right abdominal drain 120cc/24hr  Lab Results:   Recent Labs  12/12/15 0241 12/13/15 0444  WBC 12.7* 11.4*  HGB 9.9* 10.3*  HCT 30.6* 31.4*  PLT 297 373   BMET  Recent Labs  12/12/15 0241 12/13/15 0444  NA 139 140  K 3.4* 2.7*  CL 107 105  CO2 24 23  GLUCOSE 97 111*  BUN 10 9  CREATININE 0.86 0.77  CALCIUM 7.9* 7.6*   PT/INR  Recent Labs  12/11/15 0729  LABPROT 14.3  INR 1.10   CMP     Component Value Date/Time   NA 140 12/13/2015 0444   K 2.7 (LL) 12/13/2015 0444   CL 105 12/13/2015 0444   CO2 23 12/13/2015 0444   GLUCOSE 111 (H) 12/13/2015 0444   BUN 9 12/13/2015 0444   CREATININE 0.77 12/13/2015 0444   CREATININE 1.17 (H) 07/13/2015 1056   CALCIUM 7.6 (L) 12/13/2015 0444   PROT 5.3 (L) 12/13/2015 0444   ALBUMIN 1.9 (L) 12/13/2015 0444   AST 30 12/13/2015 0444   ALT 26 12/13/2015 0444   ALKPHOS 125 12/13/2015 0444   BILITOT 0.8 12/13/2015 0444   GFRNONAA >60 12/13/2015 0444   GFRNONAA 50 (L)  07/13/2015 1056   GFRAA >60 12/13/2015 0444   GFRAA 57 (L) 07/13/2015 1056   Lipase     Component Value Date/Time   LIPASE 27 12/04/2015 1602       Studies/Results: Nm Hepatobiliary Liver Func  Result Date: 12/11/2015 CLINICAL DATA:  65 year old female with a history of cholecystectomy and biloma formation. EXAM: NUCLEAR MEDICINE HEPATOBILIARY IMAGING TECHNIQUE: Sequential images of the abdomen were obtained out to 60 minutes following intravenous administration of radiopharmaceutical. RADIOPHARMACEUTICALS:  5.0 mCi Tc-38m  Choletec IV COMPARISON:  None. FINDINGS: Prompt, uniform hepatic uptake with biliary excretion. Activity within the biliary duct evident at approximately 15 minutes. No focal concentration of radiotracer at the inferior liver margin. IMPRESSION: No focal concentration of radiotracer at the inferior liver margin to confirm biliary leak on the current study. Signed, Dulcy Fanny. Earleen Newport, DO [facility] Radiology Electronically Signed   By: Corrie Mckusick D.O.   On: 12/11/2015 16:16   Dg Ercp Biliary & Pancreatic Ducts  Result Date: 12/12/2015 CLINICAL DATA:  65 year old female with a history of prior cholecystectomy and biloma. Concern for biliary leak EXAM: ERCP TECHNIQUE: Multiple spot images obtained with the fluoroscopic device and submitted for interpretation post-procedure. FLUOROSCOPY TIME:  2 minutes 57 seconds COMPARISON:  Nuclear medicine study 12/11/2015, CT 12/11/2015 FINDINGS: Limited intraoperative fluoroscopic spot images  during ERCP. Initial image demonstrates endoscope projecting over the upper abdomen with cannulation of the ampulla and retrograde infusion of contrast. Pigtail drainage catheter at the inferior liver margin is noted adjacent to surgical clips. During the course of the image, there is infusion of contrast an extraluminal location at the inferior liver margin adjacent to the pigtail drainage catheter.  Final image demonstrates placement of a stent. IMPRESSION: ERCP demonstrates evidence of biliary leak at the inferior liver margin, with subsequent placement of biliary stent. Please refer to the dictated operative report for full details of intraoperative findings and procedure. Signed, Dulcy Fanny. Earleen Newport, DO [facility] Radiology Electronically Signed   By: Corrie Mckusick D.O.   On: 12/12/2015 14:32   [place] Image Guided Drainage By Percutaneous Catheter  Result Date: 12/11/2015 INDICATION: 65 year old female with fluid collection after cholecystectomy. EXAM: [place] IMAGE GUIDED DRAINAGE BY PERCUTANEOUS CATHETE MEDICATIONS: The patient is currently admitted to the hospital and receiving intravenous antibiotics. The antibiotics were administered within an appropriate time frame prior to the initiation of the procedure. ANESTHESIA/SEDATION: Fentanyl 50 mcg IV; Versed 1.0 mg IV Moderate Sedation Time:  15 minutes The patient was continuously monitored during the procedure by the interventional radiology nurse under my direct supervision. COMPLICATIONS: None PROCEDURE: Informed written consent was obtained from the patient after a thorough discussion of the procedural risks, benefits and alternatives. All questions were addressed. Maximal Sterile Barrier Technique was utilized including caps, mask, sterile gowns, sterile gloves, sterile drape, hand hygiene and skin antiseptic. A timeout was performed prior to the initiation of the procedure. Patient positioned supine position on the ultrasound table. Scout images of the upper abdomen were performed for planning purposes. Patient was then prepped and draped in the usual sterile fashion. The skin and subcutaneous tissues were generously infiltrated 1% lidocaine for local anesthesia. Small stab incision was made with 11 blade scalpel. Using ultrasound guidance, trocar technique was used to advance a 10 [place] drain into the  subhepatic fluid collection. Once the drain was in place and confirmed in position, spontaneous ileus fluid returned. Approximately 290 cc of ileus fluid was aspirated. Sample was aspirated for the lab. Catheter was sutured in position and a final image was stored. Catheter attached to gravity drainage. Patient tolerated the procedure well and remained hemodynamically stable throughout. No complications were encountered and no significant blood loss encountered. IMPRESSION: Status post ultrasound-guided drainage of subhepatic fluid collection with approximately 290 cc of ileus fluid removed. Sample was sent the lab for culture. Signed, Dulcy Fanny. Earleen Newport, DO [facility] Radiology Electronically Signed   By: Corrie Mckusick D.O.   On: 12/11/2015 15:03    Anti-infectives: Anti-infectives    Start     Dose/Rate Route Frequency Ordered Stop   12/12/15 0930  vancomycin (VANCOCIN) IVPB 1000 mg/200 mL premix     1,000 mg 200 mL/hr over 60 Minutes Intravenous Every 12 hours 12/12/15 0913     12/11/15 0800  piperacillin-tazobactam (ZOSYN) IVPB 3.375 g     3.375 g 12.5 mL/hr over 240 Minutes Intravenous Every 8 hours 12/11/15 0536     12/11/15 0045  piperacillin-tazobactam (ZOSYN) IVPB 3.375 g     3.375 g 100 mL/hr over 30 Minutes Intravenous  Once 12/11/15 0038 12/11/15 0339       Assessment/Plan s/p Procedure(s): ENDOSCOPIC RETROGRADE CHOLANGIOPANCREATOGRAPHY (ERCP) (N/A) BILIARY STENT PLACEMENT (N/A) 12/11/15 - s/p lap chole with IOC 12/06/15 Dr. Barry Dienes - returned to ED 12/11/15 with postop  bile leak - 2 days s/p stent placement - culture showed GRAM POSITIVE COCCI IN PAIRS AND CHAINS RARE GRAM NEGATIVE RODS - started on vanc yesterday - no fevers over night, WBC trending down now 11.4 - drain output 120cc/24hr  Hypokalemia - add K to IVF and K tablets PO. Recheck in AM  ID - zosyn 10/3>>, vanc 10/4>> FEN - heart healthy, IVF VTE - SCDs, lovenox  (on eliquis preop)  Plan - pain slightly less than yesterday. Continue drain, IV antibiotics. Mobilize and IS.   LOS: 2 days    Jerrye Beavers , [facility] Surgery 12/13/2015, 8:44 AM Pager: [phone] Consults: [phone] Mon-Fri 7:00 am-4:30 pm Sat-Sun 7:00 am-11:30 am

## 2015-12-13 NOTE — Progress Notes (Signed)
Patient ID: [name], female   DOB: 06-29-1950, 65 y.o.   MRN: [id number]    Referring Physician(s): Romana Juniper  Supervising Physician: Jacqulynn Cadet  Patient Status: inpt  Chief Complaint: biloma  Subjective: Patient still weak, but pain is improving.  Still with some epigastric pain.  Some nausea, but trying to eat.  Allergies: Shellfish allergy  Medications: Prior to Admission medications   Medication Sig Start Date End Date Taking? Authorizing Provider  atorvastatin (LIPITOR) 40 MG tablet Take 1 tablet (40 mg total) by mouth daily. 01/04/15  Yes Elberta Leatherwood, MD  clopidogrel (PLAVIX) 75 MG tablet TAKE 1 TABLET BY MOUTH EVERY DAY 09/04/15  Yes Elberta Leatherwood, MD  doxepin (SINEQUAN) 100 MG capsule Take 2 capsules (200 mg total) by mouth at bedtime. 10/15/14  Yes Kathrynn Ducking, MD  furosemide (LASIX) 20 MG tablet Take 10 mg by mouth daily. 05/09/14  Yes Historical Provider, MD  gabapentin (NEURONTIN) 600 MG tablet Take 1 tablet (600 mg total) by mouth 2 (two) times daily. 12/05/15  Yes Charlett Blake, MD  lidocaine (LIDODERM) 5 % Place 2 patches onto the skin daily. Remove & Discard patch within 12 hours or as directed by MD 11/28/15  Yes Charlett Blake, MD  meclizine (ANTIVERT) 25 MG tablet Take 1 tablet (25 mg total) by mouth 2 (two) times daily as needed for dizziness. 07/16/15  Yes Elberta Leatherwood, MD  ondansetron (ZOFRAN-ODT) 4 MG disintegrating tablet Take 1 tablet (4 mg total) by mouth every 6 (six) hours as needed for nausea. 12/08/15  Yes Jerrye Beavers, PA-C  oxyCODONE (OXY IR/ROXICODONE) 5 MG immediate release tablet Take 1-2 tablets (5-10 mg total) by mouth every 4 (four) hours as needed for moderate pain. 12/08/15  Yes Brooke A Miller, PA-C  ramipril (ALTACE) 2.5 MG capsule TAKE ONE CAPSULE BY MOUTH EVERY DAY 10/01/15  Yes Elberta Leatherwood, MD  tiZANidine (ZANAFLEX) 2 MG tablet Take 1 tablet (2 mg total) by mouth 3 (three) times daily. 09/12/15  Yes Ward Givens,  NP  topiramate (TOPAMAX) 100 MG tablet TAKE 1 TABLET TWICE A DAY 05/07/15  Yes Kathrynn Ducking, MD  sertraline (ZOLOFT) 50 MG tablet TAKE 1 TABLET (50 MG TOTAL) BY MOUTH 2 (TWO) TIMES DAILY. 12/13/15   Elberta Leatherwood, MD    Vital Signs: BP 135/74   Pulse 89   Temp 98.7 F (37.1 C) (Oral)   Resp 18   Ht 5\' 7"  (1.702 m)   Wt 166 lb (75.3 kg)   SpO2 98%   BMI 26.00 kg/m   Physical Exam: Abd: soft, tender appropriately, RUQ drain in place with not output currently, drained around 0630. 120cc yesterday  Imaging: Dg Chest 2 View  Result Date: 12/10/2015 CLINICAL DATA:  History of gallbladder surgery last Wednesday. Developed fever and cough on Sunday with right-sided abdominal pain radiating up into chest. EXAM: CHEST  2 VIEW COMPARISON:  Prior chest radiographs dating back through 02/07/2012 FINDINGS: Low lung volumes since previous studies. Trace fluid in the minor fissure on the right with elevated appearance of the right hemidiaphragm which may in fact be due to a moderate-sized subpulmonic effusion. Slight blunting left lateral costophrenic angle as well presumably from a effusion. The heart and mediastinal contours are stable and within normal limits for size. Mild atherosclerosis of the aortic arch. The patient is status post ACDF of the lower cervical spine. Chronic L1 mild compression. IMPRESSION: Elevated appearance of the right hemidiaphragm  which may in fact be due to a moderate subpulmonic pleural effusion as there is also some fluid in the right minor fissure and left costophrenic angle on current exam. Decubitus views (right-side-down) of the chest may be provide more information as to whether this represents pleural fluid or CT as deemed clinically necessary. Electronically Signed   By: Ashley Royalty M.D.   On: 12/10/2015 19:45   Nm Hepatobiliary Liver Func  Result Date: 12/11/2015 CLINICAL DATA:  65 year old female with a history of cholecystectomy and biloma formation. EXAM: NUCLEAR  MEDICINE HEPATOBILIARY IMAGING TECHNIQUE: Sequential images of the abdomen were obtained out to 60 minutes following intravenous administration of radiopharmaceutical. RADIOPHARMACEUTICALS:  5.0 mCi Tc-24m  Choletec IV COMPARISON:  None. FINDINGS: Prompt, uniform hepatic uptake with biliary excretion. Activity within the biliary duct evident at approximately 15 minutes. No focal concentration of radiotracer at the inferior liver margin. IMPRESSION: No focal concentration of radiotracer at the inferior liver margin to confirm biliary leak on the current study. Signed, Dulcy Fanny. Earleen Newport, DO [facility] Radiology Electronically Signed   By: Corrie Mckusick D.O.   On: 12/11/2015 16:16   Ct Abdomen Pelvis W Contrast  Result Date: 12/11/2015 CLINICAL DATA:  Status post cholecystectomy 5 days ago, now with fever, intermittent confusion. Constipation. EXAM: CT ABDOMEN AND PELVIS WITH CONTRAST TECHNIQUE: Multidetector CT imaging of the abdomen and pelvis was performed using the standard protocol following bolus administration of intravenous contrast. CONTRAST:  1mL ISOVUE-300 IOPAMIDOL (ISOVUE-300) INJECTION 61% COMPARISON:  CT abdomen and pelvis June 21, 2013 FINDINGS: LOWER CHEST: Partially imaged small to moderate pleural effusion. Enhancing RIGHT middle lobe and RIGHT lower lobe atelectasis. Trace LEFT pleural effusion and atelectasis. Heart size is normal. Mild coronary artery calcifications . HEPATOBILIARY: Status post cholecystectomy. 7.2 x 3.8 cm fluid collection within the gallbladder fossa, this is contiguous with 5.8 x 11.6 x 15.7 cm (volume = 550 cm^3) hepatic subcapsular fluid collection. Small amount of fluid extends to Morrison's pouch. Mass effect on the RIGHT lobe of the liver, trace intrahepatic biliary dilatation. No choledocholithiasis. PANCREAS: Normal. SPLEEN: Normal. ADRENALS/URINARY TRACT: Kidneys are orthotopic, demonstrating symmetric enhancement.  No nephrolithiasis, hydronephrosis or solid renal masses. The unopacified ureters are normal in course and caliber. Delayed imaging through the kidneys demonstrates symmetric prompt contrast excretion within the proximal urinary collecting system. Urinary bladder is partially distended and unremarkable. Normal adrenal glands. STOMACH/BOWEL: The stomach, small and large bowel are normal in course and caliber without inflammatory changes. Mild amount of retained large bowel stool . Five pill like densities in the cecum. Normal appendix. VASCULAR/LYMPHATIC: Aortoiliac vessels are normal in course and caliber, severe calcific atherosclerosis. Normal appendix. No lymphadenopathy by CT size criteria. REPRODUCTIVE: Status post hysterectomy. OTHER: Small amount of low-density free fluid in the pelvis. No intraperitoneal free air. MUSCULOSKELETAL: Nonacute. Mild chronic L1 compression fracture. Severe L5-S1 degenerative disc and severe L5-S1 neural foraminal narrowing. IMPRESSION: Status post recent cholecystectomy with fluid collection within the gallbladder fossa contiguous with hepatic subcapsular 5.8 x 11.6 x 15.7 cm fluid collection compatible with biloma. Superimposed infection possible. Small amount of free fluid in the pelvis. Small to moderate RIGHT pleural effusion, trace on the LEFT with compressive atelectasis. Severe atherosclerosis. Acute findings discussed with and reconfirmed by [facility] NANAVATI on 12/11/2015 at 4:30 am. Electronically Signed   By: Elon Alas M.D.   On: 12/11/2015 04:33   Dg Ercp Biliary & Pancreatic Ducts  Result Date: 12/12/2015 CLINICAL DATA:  65 year old female with  a history of prior cholecystectomy and biloma. Concern for biliary leak EXAM: ERCP TECHNIQUE: Multiple spot images obtained with the fluoroscopic device and submitted for interpretation post-procedure. FLUOROSCOPY TIME:  2 minutes 57 seconds COMPARISON:  Nuclear medicine study 12/11/2015, CT 12/11/2015 FINDINGS:  Limited intraoperative fluoroscopic spot images during ERCP. Initial image demonstrates endoscope projecting over the upper abdomen with cannulation of the ampulla and retrograde infusion of contrast. Pigtail drainage catheter at the inferior liver margin is noted adjacent to surgical clips. During the course of the image, there is infusion of contrast an extraluminal location at the inferior liver margin adjacent to the pigtail drainage catheter. Final image demonstrates placement of a stent. IMPRESSION: ERCP demonstrates evidence of biliary leak at the inferior liver margin, with subsequent placement of biliary stent. Please refer to the dictated operative report for full details of intraoperative findings and procedure. Signed, Dulcy Fanny. Earleen Newport, DO [facility] Radiology Electronically Signed   By: Corrie Mckusick D.O.   On: 12/12/2015 14:32   [place] Image Guided Drainage By Percutaneous Catheter  Result Date: 12/11/2015 INDICATION: 65 year old female with fluid collection after cholecystectomy. EXAM: [place] IMAGE GUIDED DRAINAGE BY PERCUTANEOUS CATHETE MEDICATIONS: The patient is currently admitted to the hospital and receiving intravenous antibiotics. The antibiotics were administered within an appropriate time frame prior to the initiation of the procedure. ANESTHESIA/SEDATION: Fentanyl 50 mcg IV; Versed 1.0 mg IV Moderate Sedation Time:  15 minutes The patient was continuously monitored during the procedure by the interventional radiology nurse under my direct supervision. COMPLICATIONS: None PROCEDURE: Informed written consent was obtained from the patient after a thorough discussion of the procedural risks, benefits and alternatives. All questions were addressed. Maximal Sterile Barrier Technique was utilized including caps, mask, sterile gowns, sterile gloves, sterile drape, hand hygiene and skin antiseptic. A timeout was performed prior to the initiation of the  procedure. Patient positioned supine position on the ultrasound table. Scout images of the upper abdomen were performed for planning purposes. Patient was then prepped and draped in the usual sterile fashion. The skin and subcutaneous tissues were generously infiltrated 1% lidocaine for local anesthesia. Small stab incision was made with 11 blade scalpel. Using ultrasound guidance, trocar technique was used to advance a 10 [place] drain into the subhepatic fluid collection. Once the drain was in place and confirmed in position, spontaneous ileus fluid returned. Approximately 290 cc of ileus fluid was aspirated. Sample was aspirated for the lab. Catheter was sutured in position and a final image was stored. Catheter attached to gravity drainage. Patient tolerated the procedure well and remained hemodynamically stable throughout. No complications were encountered and no significant blood loss encountered. IMPRESSION: Status post ultrasound-guided drainage of subhepatic fluid collection with approximately 290 cc of ileus fluid removed. Sample was sent the lab for culture. Signed, Dulcy Fanny. Earleen Newport, DO [facility] Radiology Electronically Signed   By: Corrie Mckusick D.O.   On: 12/11/2015 15:03    Labs:  CBC:  Recent Labs  12/08/15 0420 12/10/15 1850 12/12/15 0241 12/13/15 0444  WBC 8.5 11.2* 12.7* 11.4*  HGB 10.7* 11.4* 9.9* 10.3*  HCT 32.6* 34.9* 30.6* 31.4*  PLT 184 320 297 373    COAGS:  Recent Labs  12/05/15 0406 12/11/15 0729  INR 1.03 1.10  APTT 33  --     BMP:  Recent Labs  12/08/15 0420 12/10/15 1850 12/12/15 0241 12/13/15 0444  NA 139 133* 139 140  K 3.5 3.2* 3.4* 2.7*  CL 111 105 107 105  CO2 23 18* 24 23  GLUCOSE 113* 111* 97 111*  BUN <5* 6 10 9   CALCIUM 7.8* 8.2* 7.9* 7.6*  CREATININE 0.77 0.87 0.86 0.77  GFRNONAA >60 >60 >60 >60  GFRAA >60 >60 >60 >60    LIVER FUNCTION TESTS:  Recent Labs  12/07/15 1240  12/10/15 1850 12/12/15 0241 12/13/15 0444  BILITOT 1.0 0.9 0.8 0.8  AST 85* 34 27 30  ALT 96* 45 28 26  ALKPHOS 99 147* 125 125  PROT 5.4* 6.5 5.0* 5.3*  ALBUMIN 2.7* 2.9* 2.0* 1.9*    Assessment and Plan: 1. S/p lap chole with biloma, s/p perc drain on 10/3 -drain in place and doing well. -ERCP yesterday with stent placement.  Hopefully drain output begins to trend down -will follow -drain care will here  Electronically Signed: [place] E 12/13/2015, 10:10 AM   I spent a total of 15 Minutes at the the patient's bedside AND on the patient's hospital floor or unit, greater than 50% of which was counseling/coordinating care for biloma

## 2015-12-13 NOTE — Progress Notes (Signed)
Brooke Miller responded and ordered KCL CR tablet 40 mEq at 0715 and 1200.  Endorsed to day shift RN appropriately.

## 2015-12-13 NOTE — Progress Notes (Signed)
Daily Rounding Note  12/13/2015, 11:26 AM  LOS: 2 days   SUBJECTIVE:   Chief complaint: abdominal pain.    RUQ pain "50%" better but still intense.  Tolerated mashed potatoes last night but solid diet thi AM made pain worse.  Loose stools after getting colace, senokot in ICU.  No vomiting Biloma drainage: 120 ml 10/4 Diet is HH, solid.   OBJECTIVE:         Vital signs in last 24 hours:    Temp:  [98.3 F (36.8 C)-98.7 F (37.1 C)] 98.7 F (37.1 C) (10/05 0915) Pulse Rate:  [81-103] 89 (10/05 0915) Resp:  [16-35] 18 (10/05 0915) BP: (117-173)/(59-93) 135/74 (10/05 0600) SpO2:  [94 %-98 %] 98 % (10/05 0915) Last BM Date: 12/13/15 Filed Weights   12/10/15 1837 12/10/15 1847  Weight: 74.8 kg (165 lb) 75.3 kg (166 lb)   General: still ill and uncomfortable   Heart: RRR Chest: diminished BS, no labored breathing Abdomen: BS scant,  Slight distended.  Did not push on abdomen as it is already hurting and she just got pain meds to try to help the pain.    Extremities: no CCE Neuro/Psych:  Pleasant, alert, oriented x 3.  No gross deficits or tremor.   Intake/Output from previous day: 10/04 0701 - 10/05 0700 In: 3735.8 [P.O.:460; I.V.:2770.8; IV Piggyback:500] Out: 340 [Urine:220; Drains:120]  Intake/Output this shift: No intake/output data recorded.  Lab Results:  Recent Labs  12/10/15 1850 12/12/15 0241 12/13/15 0444  WBC 11.2* 12.7* 11.4*  HGB 11.4* 9.9* 10.3*  HCT 34.9* 30.6* 31.4*  PLT 320 297 373   BMET  Recent Labs  12/10/15 1850 12/12/15 0241 12/13/15 0444  NA 133* 139 140  K 3.2* 3.4* 2.7*  CL 105 107 105  CO2 18* 24 23  GLUCOSE 111* 97 111*  BUN 6 10 9   CREATININE 0.87 0.86 0.77  CALCIUM 8.2* 7.9* 7.6*   LFT  Recent Labs  12/10/15 1850 12/12/15 0241 12/13/15 0444  PROT 6.5 5.0* 5.3*  ALBUMIN 2.9* 2.0* 1.9*  AST 34 27 30  ALT 45 28 26  ALKPHOS 147* 125 125  BILITOT 0.9 0.8 0.8     PT/INR  Recent Labs  12/11/15 0729  LABPROT 14.3  INR 1.10   Hepatitis Panel No results for input(s): HEPBSAG, HCVAB, HEPAIGM, HEPBIGM in the last 72 hours.  Studies/Results: Nm Hepatobiliary Liver Func  Result Date: 12/11/2015 CLINICAL DATA:  65 year old female with a history of cholecystectomy and biloma formation. EXAM: NUCLEAR MEDICINE HEPATOBILIARY IMAGING TECHNIQUE: Sequential images of the abdomen were obtained out to 60 minutes following intravenous administration of radiopharmaceutical. RADIOPHARMACEUTICALS:  5.0 mCi Tc-41m  Choletec IV COMPARISON:  None. FINDINGS: Prompt, uniform hepatic uptake with biliary excretion. Activity within the biliary duct evident at approximately 15 minutes. No focal concentration of radiotracer at the inferior liver margin. IMPRESSION: No focal concentration of radiotracer at the inferior liver margin to confirm biliary leak on the current study. Signed, Dulcy Fanny. Earleen Newport, DO [facility] Radiology Electronically Signed   By: Corrie Mckusick D.O.   On: 12/11/2015 16:16   Dg Ercp Biliary & Pancreatic Ducts  Result Date: 12/12/2015 CLINICAL DATA:  65 year old female with a history of prior cholecystectomy and biloma. Concern for biliary leak EXAM: ERCP TECHNIQUE: Multiple spot images obtained with the fluoroscopic device and submitted for interpretation post-procedure. FLUOROSCOPY TIME:  2 minutes 57 seconds COMPARISON:  Nuclear medicine study  12/11/2015, CT 12/11/2015 FINDINGS: Limited intraoperative fluoroscopic spot images during ERCP. Initial image demonstrates endoscope projecting over the upper abdomen with cannulation of the ampulla and retrograde infusion of contrast. Pigtail drainage catheter at the inferior liver margin is noted adjacent to surgical clips. During the course of the image, there is infusion of contrast an extraluminal location at the inferior liver margin adjacent to the pigtail drainage  catheter. Final image demonstrates placement of a stent. IMPRESSION: ERCP demonstrates evidence of biliary leak at the inferior liver margin, with subsequent placement of biliary stent. Please refer to the dictated operative report for full details of intraoperative findings and procedure. Signed, Dulcy Fanny. Earleen Newport, DO [facility] Radiology Electronically Signed   By: Corrie Mckusick D.O.   On: 12/12/2015 14:32   [place] Image Guided Drainage By Percutaneous Catheter  Result Date: 12/11/2015 INDICATION: 65 year old female with fluid collection after cholecystectomy. EXAM: [place] IMAGE GUIDED DRAINAGE BY PERCUTANEOUS CATHETE MEDICATIONS: The patient is currently admitted to the hospital and receiving intravenous antibiotics. The antibiotics were administered within an appropriate time frame prior to the initiation of the procedure. ANESTHESIA/SEDATION: Fentanyl 50 mcg IV; Versed 1.0 mg IV Moderate Sedation Time:  15 minutes The patient was continuously monitored during the procedure by the interventional radiology nurse under my direct supervision. COMPLICATIONS: None PROCEDURE: Informed written consent was obtained from the patient after a thorough discussion of the procedural risks, benefits and alternatives. All questions were addressed. Maximal Sterile Barrier Technique was utilized including caps, mask, sterile gowns, sterile gloves, sterile drape, hand hygiene and skin antiseptic. A timeout was performed prior to the initiation of the procedure. Patient positioned supine position on the ultrasound table. Scout images of the upper abdomen were performed for planning purposes. Patient was then prepped and draped in the usual sterile fashion. The skin and subcutaneous tissues were generously infiltrated 1% lidocaine for local anesthesia. Small stab incision was made with 11 blade scalpel. Using ultrasound guidance, trocar technique was used to advance a 10 [place] drain into  the subhepatic fluid collection. Once the drain was in place and confirmed in position, spontaneous ileus fluid returned. Approximately 290 cc of ileus fluid was aspirated. Sample was aspirated for the lab. Catheter was sutured in position and a final image was stored. Catheter attached to gravity drainage. Patient tolerated the procedure well and remained hemodynamically stable throughout. No complications were encountered and no significant blood loss encountered. IMPRESSION: Status post ultrasound-guided drainage of subhepatic fluid collection with approximately 290 cc of ileus fluid removed. Sample was sent the lab for culture. Signed, Dulcy Fanny. Earleen Newport, DO [facility] Radiology Electronically Signed   By: Corrie Mckusick D.O.   On: 12/11/2015 15:03    ASSESMENT:   *  Bile leak from right intrahepatic branch duct, no leak from cystic duct. IR placed catheter drain to biloma 10/3.  ERCP 10/4 with plastic stent placement to CBD.    PLAN   * CBD stent removal in 4 to 6 weeks, depending on clinical progress.    *  Regress diet to full liquids and restart NS at 125 per hour as her po intake likely to be inadequate today.   Check lipase now.       Azucena Freed  12/13/2015, 11:26 AM Pager: [phone]

## 2015-12-13 NOTE — Progress Notes (Signed)
Paged Dr. Evlyn Courier regarding critical lab, K=2.7 from 3.4 yesterday.  Patient had Potassium tabs 53mEq x 2 last night.

## 2015-12-14 DIAGNOSIS — Z818 Family history of other mental and behavioral disorders: Secondary | ICD-10-CM

## 2015-12-14 DIAGNOSIS — B954 Other streptococcus as the cause of diseases classified elsewhere: Secondary | ICD-10-CM

## 2015-12-14 DIAGNOSIS — T814XXA Infection following a procedure, initial encounter: Principal | ICD-10-CM

## 2015-12-14 DIAGNOSIS — A491 Streptococcal infection, unspecified site: Secondary | ICD-10-CM

## 2015-12-14 DIAGNOSIS — K651 Peritoneal abscess: Secondary | ICD-10-CM

## 2015-12-14 DIAGNOSIS — Z809 Family history of malignant neoplasm, unspecified: Secondary | ICD-10-CM

## 2015-12-14 DIAGNOSIS — T8189XA Other complications of procedures, not elsewhere classified, initial encounter: Secondary | ICD-10-CM

## 2015-12-14 DIAGNOSIS — Y838 Other surgical procedures as the cause of abnormal reaction of the patient, or of later complication, without mention of misadventure at the time of the procedure: Secondary | ICD-10-CM

## 2015-12-14 DIAGNOSIS — Z8349 Family history of other endocrine, nutritional and metabolic diseases: Secondary | ICD-10-CM

## 2015-12-14 DIAGNOSIS — Z8042 Family history of malignant neoplasm of prostate: Secondary | ICD-10-CM

## 2015-12-14 DIAGNOSIS — Z9049 Acquired absence of other specified parts of digestive tract: Secondary | ICD-10-CM

## 2015-12-14 DIAGNOSIS — Z113 Encounter for screening for infections with a predominantly sexual mode of transmission: Secondary | ICD-10-CM

## 2015-12-14 DIAGNOSIS — Z801 Family history of malignant neoplasm of trachea, bronchus and lung: Secondary | ICD-10-CM

## 2015-12-14 DIAGNOSIS — Z91013 Allergy to seafood: Secondary | ICD-10-CM

## 2015-12-14 DIAGNOSIS — Z87891 Personal history of nicotine dependence: Secondary | ICD-10-CM

## 2015-12-14 DIAGNOSIS — Z803 Family history of malignant neoplasm of breast: Secondary | ICD-10-CM

## 2015-12-14 DIAGNOSIS — T8189XS Other complications of procedures, not elsewhere classified, sequela: Secondary | ICD-10-CM

## 2015-12-14 DIAGNOSIS — K668 Other specified disorders of peritoneum: Secondary | ICD-10-CM

## 2015-12-14 LAB — CBC
HCT: 34.6 % — ABNORMAL LOW (ref 36.0–46.0)
Hemoglobin: 11.3 g/dL — ABNORMAL LOW (ref 12.0–15.0)
MCH: 29 pg (ref 26.0–34.0)
MCHC: 32.7 g/dL (ref 30.0–36.0)
MCV: 88.7 fL (ref 78.0–100.0)
Platelets: 578 10*3/uL — ABNORMAL HIGH (ref 150–400)
RBC: 3.9 MIL/uL (ref 3.87–5.11)
RDW: 15.3 % (ref 11.5–15.5)
WBC: 16.2 10*3/uL — ABNORMAL HIGH (ref 4.0–10.5)

## 2015-12-14 LAB — COMPREHENSIVE METABOLIC PANEL
ALT: 31 U/L (ref 14–54)
AST: 43 U/L — ABNORMAL HIGH (ref 15–41)
Albumin: 2.3 g/dL — ABNORMAL LOW (ref 3.5–5.0)
Alkaline Phosphatase: 148 U/L — ABNORMAL HIGH (ref 38–126)
Anion gap: 12 (ref 5–15)
BUN: <5 mg/dL — ABNORMAL LOW (ref 6–20)
CO2: 20 mmol/L — ABNORMAL LOW (ref 22–32)
Calcium: 8 mg/dL — ABNORMAL LOW (ref 8.9–10.3)
Chloride: 105 mmol/L (ref 101–111)
Creatinine, Ser: 0.87 mg/dL (ref 0.44–1.00)
GFR calc Af Amer: >60 mL/min (ref >60)
GFR calc non Af Amer: >60 mL/min (ref >60)
Glucose, Bld: 108 mg/dL — ABNORMAL HIGH (ref 65–99)
Potassium: 3.1 mmol/L — ABNORMAL LOW (ref 3.5–5.1)
Sodium: 137 mmol/L (ref 135–145)
Total Bilirubin: 0.6 mg/dL (ref 0.3–1.2)
Total Protein: 6.3 g/dL — ABNORMAL LOW (ref 6.5–8.1)

## 2015-12-14 MED ORDER — METRONIDAZOLE 500 MG PO TABS
500.0000 mg | ORAL_TABLET | Freq: Three times a day (TID) | ORAL | Status: AC
  Administered 2015-12-14 – 2015-12-24 (×31): 500 mg via ORAL
  Filled 2015-12-14 (×33): qty 1

## 2015-12-14 MED ORDER — POTASSIUM CHLORIDE CRYS ER 20 MEQ PO TBCR
40.0000 meq | EXTENDED_RELEASE_TABLET | Freq: Two times a day (BID) | ORAL | Status: AC
  Administered 2015-12-14 (×2): 40 meq via ORAL
  Filled 2015-12-14 (×2): qty 2

## 2015-12-14 MED ORDER — DEXTROSE 5 % IV SOLN
2.0000 g | INTRAVENOUS | Status: DC
  Administered 2015-12-14 – 2015-12-25 (×12): 2 g via INTRAVENOUS
  Filled 2015-12-14 (×16): qty 2

## 2015-12-14 MED ORDER — HYDROMORPHONE HCL 1 MG/ML IJ SOLN
0.2000 mg | INTRAMUSCULAR | Status: DC | PRN
  Administered 2015-12-14 – 2015-12-17 (×5): 0.2 mg via INTRAVENOUS
  Filled 2015-12-14 (×5): qty 1

## 2015-12-14 MED ORDER — OXYCODONE HCL 5 MG PO TABS
5.0000 mg | ORAL_TABLET | ORAL | Status: DC | PRN
  Administered 2015-12-14 – 2015-12-16 (×10): 10 mg via ORAL
  Filled 2015-12-14 (×11): qty 2

## 2015-12-14 MED ORDER — KCL-LACTATED RINGERS 20 MEQ/L IV SOLN
INTRAVENOUS | Status: DC
Start: 2015-12-14 — End: 2015-12-28
  Administered 2015-12-14 – 2015-12-15 (×4): via INTRAVENOUS
  Administered 2015-12-15: 1 mL via INTRAVENOUS
  Administered 2015-12-16 – 2015-12-28 (×7): via INTRAVENOUS
  Filled 2015-12-14 (×24): qty 1000

## 2015-12-14 NOTE — Progress Notes (Signed)
[facility] Progress Note  2 Days Post-Op  Subjective: Nausea and pain improved from yesterday. Pain worse with movement. Mobilizing, tolerating PO, pulling 750 on IS. +BMs. Denies melena.   Objective: Vital signs in last 24 hours: Temp:  [97.7 F (36.5 C)-99.8 F (37.7 C)] 98.7 F (37.1 C) (10/06 0420) Pulse Rate:  [89-100] 94 (10/06 0420) Resp:  [18-22] 19 (10/06 0420) BP: (142-150)/(70-81) 145/80 (10/06 0420) SpO2:  [95 %-100 %] 95 % (10/06 0420) Last BM Date: 12/13/15  Intake/Output from previous day: 10/05 0701 - 10/06 0700 In: 2451.3 [P.O.:240; I.V.:2156.3; IV Piggyback:50] Out: 30 [Drains:30] Intake/Output this shift: No intake/output data recorded.  PE: Gen:  Alert, NAD, pleasant Card:  RRR, no M/G/R  Abd: Soft, appropriately tender, ND, +BS, incisions C/D/I, RUQ drain with <5cc drainage. Ext:  No erythema, edema, or tenderness   Lab Results:   Recent Labs  12/13/15 0444 12/14/15 0453  WBC 11.4* 16.2*  HGB 10.3* 11.3*  HCT 31.4* 34.6*  PLT 373 578*   BMET  Recent Labs  12/13/15 0444 12/14/15 0453  NA 140 137  K 2.7* 3.1*  CL 105 105  CO2 23 20*  GLUCOSE 111* 108*  BUN 9 <5*  CREATININE 0.77 0.87  CALCIUM 7.6* 8.0*   PT/INR No results for input(s): LABPROT, INR in the last 72 hours. CMP     Component Value Date/Time   NA 137 12/14/2015 0453   K 3.1 (L) 12/14/2015 0453   CL 105 12/14/2015 0453   CO2 20 (L) 12/14/2015 0453   GLUCOSE 108 (H) 12/14/2015 0453   BUN <5 (L) 12/14/2015 0453   CREATININE 0.87 12/14/2015 0453   CREATININE 1.17 (H) 07/13/2015 1056   CALCIUM 8.0 (L) 12/14/2015 0453   PROT 6.3 (L) 12/14/2015 0453   ALBUMIN 2.3 (L) 12/14/2015 0453   AST 43 (H) 12/14/2015 0453   ALT 31 12/14/2015 0453   ALKPHOS 148 (H) 12/14/2015 0453   BILITOT 0.6 12/14/2015 0453   GFRNONAA >60 12/14/2015 0453   GFRNONAA 50 (L) 07/13/2015 1056   GFRAA >60 12/14/2015 0453   GFRAA 57 (L) 07/13/2015 1056   Lipase     Component  Value Date/Time   LIPASE 3,460 (H) 12/13/2015 0444   Studies/Results: Dg Ercp Biliary & Pancreatic Ducts  Result Date: 12/12/2015 CLINICAL DATA:  65 year old female with a history of prior cholecystectomy and biloma. Concern for biliary leak EXAM: ERCP TECHNIQUE: Multiple spot images obtained with the fluoroscopic device and submitted for interpretation post-procedure. FLUOROSCOPY TIME:  2 minutes 57 seconds COMPARISON:  Nuclear medicine study 12/11/2015, CT 12/11/2015 FINDINGS: Limited intraoperative fluoroscopic spot images during ERCP. Initial image demonstrates endoscope projecting over the upper abdomen with cannulation of the ampulla and retrograde infusion of contrast. Pigtail drainage catheter at the inferior liver margin is noted adjacent to surgical clips. During the course of the image, there is infusion of contrast an extraluminal location at the inferior liver margin adjacent to the pigtail drainage catheter. Final image demonstrates placement of a stent. IMPRESSION: ERCP demonstrates evidence of biliary leak at the inferior liver margin, with subsequent placement of biliary stent. Please refer to the dictated operative report for full details of intraoperative findings and procedure. Signed, Dulcy Fanny. Earleen Newport, DO [facility] Radiology Electronically Signed   By: Corrie Mckusick D.O.   On: 12/12/2015 14:32    Anti-infectives: Anti-infectives    Start     Dose/Rate Route Frequency Ordered Stop   12/12/15 0930  vancomycin (  VANCOCIN) IVPB 1000 mg/200 mL premix     1,000 mg 200 mL/hr over 60 Minutes Intravenous Every 12 hours 12/12/15 0913     12/11/15 0800  piperacillin-tazobactam (ZOSYN) IVPB 3.375 g     3.375 g 12.5 mL/hr over 240 Minutes Intravenous Every 8 hours 12/11/15 0536     12/11/15 0045  piperacillin-tazobactam (ZOSYN) IVPB 3.375 g     3.375 g 100 mL/hr over 30 Minutes Intravenous  Once 12/11/15 0038 12/11/15 0339      Assessment/Plan s/p Procedure(s): ENDOSCOPIC RETROGRADE CHOLANGIOPANCREATOGRAPHY (ERCP) (N/A) BILIARY STENT PLACEMENT (N/A) 12/11/15 - s/p lap chole with IOC 12/06/15 Dr. Barry Dienes - returned to ED 12/11/15 with postop bile leak - RUQ drain placed 12/11/15 -  s/p stent placement 12/12/15, Dr. Loletha Carrow - culture showed gram positive and gram negative rods; Viridans streptococcus; started on vanc 12/12/15 - afebrile, WBC 16.2 - drain output 30cc/24hr   Hypokalemia - add K to IVF and K tablets PO. Recheck in AM   ID - zosyn 10/3>>, vanc 10/4>> FEN - heart healthy, IVF VTE - SCDs, lovenox (on eliquis preop)  Plan - PO pain control Advance to soft diet   LOS: 3 days    Jill Alexanders , [facility] Surgery 12/14/2015, 8:29 AM Pager: [phone] Consults: [phone] Mon-Fri 7:00 am-4:30 pm Sat-Sun 7:00 am-11:30 am

## 2015-12-14 NOTE — Progress Notes (Addendum)
    [facility] has been requested to perform a transesophageal echocardiogram on [name] for sepsis.  After careful review of history and examination, the risks and benefits of transesophageal echocardiogram have been explained including risks of esophageal damage, perforation (1:10,000 risk), bleeding, pharyngeal hematoma as well as other potential complications associated with conscious sedation including aspiration, arrhythmia, respiratory failure and death. Alternatives to treatment were discussed, questions were answered. Patient is willing to proceed.   Scheduled for 12/17/15    Cecilie Kicks, NP  12/14/2015 4:04 PM

## 2015-12-14 NOTE — Consult Note (Addendum)
Date of Admission:  12/11/2015  Date of Consult:  12/14/2015  Reason for Consult: Viridans group streptococcal bacteremia, bile leak, abscess Referring Physician: Dr. [name]   HPI: [name] is an 65 y.o. female initially admitted with cholecystitis on September 26th, and sp laparoscopic cholecystectomy. The patient did have some very low grade temperatures postoperatively but otherwise was doing well and DC to home.  8 days postoperatively and 3 days after DC she returned with abrupt onset of confusion, fevers to 102. CT in the ED showed   fluid collection within the gallbladder fossa contiguous with hepatic subcapsular 5.8 x 11.6 x 15.7 cm fluid collection   Blood cultures were drawn and the patient placed on vancomycin and zosyn.   She underwent IR guided drain of bilious fluid and this was sent for culture and has grown streptococcus salvarius I to PCN but S to CTX. Blood cultures are growing 2/2 Viridands group streptococci. She is also sp ERCP with stent placement. She tells me she was feeling better yesterday but worse today with more body aches.     Past Medical History:  Diagnosis Date  . Chronic kidney disease    stage 3  . GERD (gastroesophageal reflux disease)   . Hyperlipidemia   . Migraine headache   . Occipital neuralgia    Left  . Stroke [facility]) 10/2013    Past Surgical History:  Procedure Laterality Date  . ABDOMINAL HYSTERECTOMY    . BILIARY STENT PLACEMENT N/A 12/12/2015   Procedure: BILIARY STENT PLACEMENT;  Surgeon: Doran Stabler, MD;  Location: [place] [facility];  Service: Endoscopy;  Laterality: N/A;  . BLADDER REPAIR    . CARPAL TUNNEL RELEASE    . CERVICAL SPINE SURGERY    . CHOLECYSTECTOMY N/A 12/06/2015   Procedure: LAPAROSCOPIC CHOLECYSTECTOMY WITH  INTRAOPERATIVE CHOLANGIOGRAM;  Surgeon: Stark Klein, MD;  Location: [place];  Service: General;  Laterality: N/A;  . ELBOW SURGERY    . ERCP N/A 12/12/2015   Procedure: ENDOSCOPIC  RETROGRADE CHOLANGIOPANCREATOGRAPHY (ERCP);  Surgeon: Doran Stabler, MD;  Location: [facility];  Service: Endoscopy;  Laterality: N/A;  . KNEE SURGERY    . LUNG SURGERY      Social History:  reports that she quit smoking about 4 years ago. Her smoking use included Cigarettes. She smoked 0.30 packs per day. She has never used smokeless tobacco. She reports that she does not drink alcohol or use drugs.   Family History  Problem Relation Age of Onset  . Cancer Mother 50    breast  . Alzheimer's disease Mother   . Cancer Brother   . Hyperlipidemia Brother   . Prostate cancer Father   . Lung cancer Father     Allergies  Allergen Reactions  . Shellfish Allergy Anaphylaxis and Other (See Comments)    All seafood     Medications: I have reviewed patients current medications as documented in Epic Anti-infectives    Start     Dose/Rate Route Frequency Ordered Stop   12/14/15 1445  cefTRIAXone (ROCEPHIN) 2 g in dextrose 5 % 50 mL IVPB     2 g 100 mL/hr over 30 Minutes Intravenous Every 24 hours 12/14/15 1435     12/14/15 1445  metroNIDAZOLE (FLAGYL) tablet 500 mg     500 mg Oral Every 8 hours 12/14/15 1435     12/12/15 0930  vancomycin (VANCOCIN) IVPB 1000 mg/200 mL premix  Status:  Discontinued  1,000 mg 200 mL/hr over 60 Minutes Intravenous Every 12 hours 12/12/15 0913 12/14/15 1435   12/11/15 0800  piperacillin-tazobactam (ZOSYN) IVPB 3.375 g  Status:  Discontinued     3.375 g 12.5 mL/hr over 240 Minutes Intravenous Every 8 hours 12/11/15 0536 12/14/15 1435   12/11/15 0045  piperacillin-tazobactam (ZOSYN) IVPB 3.375 g     3.375 g 100 mL/hr over 30 Minutes Intravenous  Once 12/11/15 0038 12/11/15 0339         ROS: as in HPI otherwise remainder of 12 point Review of Systems is negative   Blood pressure (!) 142/77, pulse (!) 102, temperature 99.8 F (37.7 C), temperature source Oral, resp. rate 19, height _0  (1.702 m), weight 166 lb (75.3 kg), SpO2 97  %. General: Alert and awake, oriented x3, flushed lying still. HEENT: anicteric sclera,  EOMI, oropharynx clear and without exudate Cardiovascular: tachy rate, normal r,  no murmur rubs or gallops Pulmonary: clear to auscultation bilaterally, no wheezing, rales or rhonchi Gastrointestinal: soft ttp RUQ, +bs, drain with biliuos material present Musculoskeletal: no  clubbing or edema noted bilaterally Skin, soft tissue: no rashes Neuro: nonfocal, strength and sensation intact   Results for orders placed or performed during the hospital encounter of 12/11/15 (from the past 48 hour(s))  CBC     Status: Abnormal   Collection Time: 12/13/15  4:44 AM  Result Value Ref Range   WBC 11.4 (H) 4.0 - 10.5 K/uL   RBC 3.60 (L) 3.87 - 5.11 MIL/uL   Hemoglobin 10.3 (L) 12.0 - 15.0 g/dL   HCT 31.4 (L) 36.0 - 46.0 %   MCV 87.2 78.0 - 100.0 fL   MCH 28.6 26.0 - 34.0 pg   MCHC 32.8 30.0 - 36.0 g/dL   RDW 14.5 11.5 - 15.5 %   Platelets 373 150 - 400 K/uL  Comprehensive metabolic panel     Status: Abnormal   Collection Time: 12/13/15  4:44 AM  Result Value Ref Range   Sodium 140 135 - 145 mmol/L   Potassium 2.7 (LL) 3.5 - 5.1 mmol/L    Comment: CRITICAL RESULT CALLED TO, READ BACK BY AND VERIFIED WITH: SSISON,RN [id number] [facility]    Chloride 105 101 - 111 mmol/L   CO2 23 22 - 32 mmol/L   Glucose, Bld 111 (H) 65 - 99 mg/dL   BUN 9 6 - 20 mg/dL   Creatinine, Ser 0.77 0.44 - 1.00 mg/dL   Calcium 7.6 (L) 8.9 - 10.3 mg/dL   Total Protein 5.3 (L) 6.5 - 8.1 g/dL   Albumin 1.9 (L) 3.5 - 5.0 g/dL   AST 30 15 - 41 U/L   ALT 26 14 - 54 U/L   Alkaline Phosphatase 125 38 - 126 U/L   Total Bilirubin 0.8 0.3 - 1.2 mg/dL   GFR calc non Af Amer >60 >60 mL/min   GFR calc Af Amer >60 >60 mL/min    Comment: (NOTE) The eGFR has been calculated using the CKD EPI equation. This calculation has not been validated in all clinical situations. eGFR's persistently <60 mL/min signify possible Chronic  Kidney Disease.    Anion gap 12 5 - 15  Lipase, blood     Status: Abnormal   Collection Time: 12/13/15  4:44 AM  Result Value Ref Range   Lipase 3,460 (H) 11 - 51 U/L    Comment: RESULTS CONFIRMED BY MANUAL DILUTION  Comprehensive metabolic panel     Status: Abnormal   Collection Time: 12/14/15  4:53 AM  Result Value Ref Range   Sodium 137 135 - 145 mmol/L   Potassium 3.1 (L) 3.5 - 5.1 mmol/L   Chloride 105 101 - 111 mmol/L   CO2 20 (L) 22 - 32 mmol/L   Glucose, Bld 108 (H) 65 - 99 mg/dL   BUN <5 (L) 6 - 20 mg/dL   Creatinine, Ser 0.87 0.44 - 1.00 mg/dL   Calcium 8.0 (L) 8.9 - 10.3 mg/dL   Total Protein 6.3 (L) 6.5 - 8.1 g/dL   Albumin 2.3 (L) 3.5 - 5.0 g/dL   AST 43 (H) 15 - 41 U/L   ALT 31 14 - 54 U/L   Alkaline Phosphatase 148 (H) 38 - 126 U/L   Total Bilirubin 0.6 0.3 - 1.2 mg/dL   GFR calc non Af Amer >60 >60 mL/min   GFR calc Af Amer >60 >60 mL/min    Comment: (NOTE) The eGFR has been calculated using the CKD EPI equation. This calculation has not been validated in all clinical situations. eGFR's persistently <60 mL/min signify possible Chronic Kidney Disease.    Anion gap 12 5 - 15  CBC     Status: Abnormal   Collection Time: 12/14/15  4:53 AM  Result Value Ref Range   WBC 16.2 (H) 4.0 - 10.5 K/uL   RBC 3.90 3.87 - 5.11 MIL/uL   Hemoglobin 11.3 (L) 12.0 - 15.0 g/dL   HCT 34.6 (L) 36.0 - 46.0 %   MCV 88.7 78.0 - 100.0 fL   MCH 29.0 26.0 - 34.0 pg   MCHC 32.7 30.0 - 36.0 g/dL   RDW 15.3 11.5 - 15.5 %   Platelets 578 (H) 150 - 400 K/uL  Culture, blood (Routine X 2) w Reflex to ID Panel     Status: None (Preliminary result)   Collection Time: 12/14/15  3:29 PM  Result Value Ref Range   Specimen Description BLOOD RIGHT HAND    Special Requests BOTTLES DRAWN AEROBIC ONLY 5CC    Culture PENDING    Report Status PENDING   Culture, blood (Routine X 2) w Reflex to ID Panel     Status: None (Preliminary result)   Collection Time: 12/14/15  3:33 PM  Result Value Ref  Range   Specimen Description BLOOD LEFT HAND    Special Requests IN PEDIATRIC BOTTLE 1.5CC    Culture PENDING    Report Status PENDING    _0 (sdes,specrequest,cult,reptstatus)   ) Recent Results (from the past 720 hour(s))  Surgical pcr screen     Status: None   Collection Time: 12/05/15  3:35 AM  Result Value Ref Range Status   MRSA, PCR NEGATIVE NEGATIVE Final   Staphylococcus aureus NEGATIVE NEGATIVE Final    Comment:        The Xpert SA Assay (FDA approved for NASAL specimens in patients over 15 years of age), is one component of a comprehensive surveillance program.  Test performance has been validated by [facility] for patients greater than or equal to 42 year old. It is not intended to diagnose infection nor to guide or monitor treatment.   Culture, blood (Routine x 2)     Status: Abnormal (Preliminary result)   Collection Time: 12/10/15  7:00 PM  Result Value Ref Range Status   Specimen Description BLOOD LEFT HAND  Final   Special Requests BOTTLES DRAWN AEROBIC ONLY 5CC  Final   Culture  Setup Time   Final    GRAM POSITIVE COCCOBACILLUS AEROBIC BOTTLE ONLY Organism  ID to follow CRITICAL RESULT CALLED TO, READ BACK BY AND VERIFIED WITH: J. Frens Pharm.D. 13:00 12/11/15 ([email or web address])    Culture VIRIDANS STREPTOCOCCUS REPEATING SENSITIVITIES  (A)  Final   Report Status PENDING  Incomplete  Urine culture     Status: None   Collection Time: 12/10/15  7:00 PM  Result Value Ref Range Status   Specimen Description Urine  Final   Special Requests NONE  Final   Culture NO GROWTH  Final   Report Status 12/12/2015 FINAL  Final  Blood Culture ID Panel (Reflexed)     Status: Abnormal   Collection Time: 12/10/15  7:00 PM  Result Value Ref Range Status   Enterococcus species NOT DETECTED NOT DETECTED Final   Listeria monocytogenes NOT DETECTED NOT DETECTED Final   Staphylococcus species NOT DETECTED NOT DETECTED Final   Staphylococcus aureus NOT DETECTED NOT  DETECTED Final   Streptococcus species DETECTED (A) NOT DETECTED Final    Comment: CRITICAL RESULT CALLED TO, READ BACK BY AND VERIFIED WITH: J. Frens Pharm.D. 13:00 12/11/15 ([email or web address])    Streptococcus agalactiae NOT DETECTED NOT DETECTED Final   Streptococcus pneumoniae NOT DETECTED NOT DETECTED Final   Streptococcus pyogenes NOT DETECTED NOT DETECTED Final   Acinetobacter baumannii NOT DETECTED NOT DETECTED Final   Enterobacteriaceae species NOT DETECTED NOT DETECTED Final   Enterobacter cloacae complex NOT DETECTED NOT DETECTED Final   Escherichia coli NOT DETECTED NOT DETECTED Final   Klebsiella oxytoca NOT DETECTED NOT DETECTED Final   Klebsiella pneumoniae NOT DETECTED NOT DETECTED Final   Proteus species NOT DETECTED NOT DETECTED Final   Serratia marcescens NOT DETECTED NOT DETECTED Final   Haemophilus influenzae NOT DETECTED NOT DETECTED Final   Neisseria meningitidis NOT DETECTED NOT DETECTED Final   Pseudomonas aeruginosa NOT DETECTED NOT DETECTED Final   Candida albicans NOT DETECTED NOT DETECTED Final   Candida glabrata NOT DETECTED NOT DETECTED Final   Candida krusei NOT DETECTED NOT DETECTED Final   Candida parapsilosis NOT DETECTED NOT DETECTED Final   Candida tropicalis NOT DETECTED NOT DETECTED Final  Culture, blood (Routine x 2)     Status: Abnormal (Preliminary result)   Collection Time: 12/10/15  7:13 PM  Result Value Ref Range Status   Specimen Description BLOOD RIGHT ANTECUBITAL  Final   Special Requests BOTTLES DRAWN AEROBIC ONLY 5CC  Final   Culture  Setup Time   Final    GRAM POSITIVE COCCOBACILLUS AEROBIC BOTTLE ONLY CRITICAL RESULT CALLED TO, READ BACK BY AND VERIFIED WITH: J. Frens Pharm.D. 13:00 12/11/15 ([email or web address])    Culture (A)  Final    VIRIDANS STREPTOCOCCUS SUSCEPTIBILITIES TO FOLLOW    Report Status PENDING  Incomplete  MRSA PCR Screening     Status: None   Collection Time: 12/11/15  6:27 AM  Result Value Ref Range Status   MRSA by PCR  NEGATIVE NEGATIVE Final    Comment:        The GeneXpert MRSA Assay (FDA approved for NASAL specimens only), is one component of a comprehensive MRSA colonization surveillance program. It is not intended to diagnose MRSA infection nor to guide or monitor treatment for MRSA infections.   Aerobic/Anaerobic Culture (surgical/deep wound)     Status: None (Preliminary result)   Collection Time: 12/11/15  1:12 PM  Result Value Ref Range Status   Specimen Description ABDOMEN  Final   Special Requests NONE  Final   Gram Stain   Final    NO WBC SEEN FEW GRAM POSITIVE COCCI  IN PAIRS AND CHAINS RARE GRAM NEGATIVE RODS    Culture   Final    MODERATE STREPTOCOCCUS SALIVARIUS NO ANAEROBES ISOLATED; CULTURE IN PROGRESS FOR 5 DAYS    Report Status PENDING  Incomplete   Organism ID, Bacteria STREPTOCOCCUS SALIVARIUS  Final      Susceptibility   Streptococcus salivarius - MIC*    PENICILLIN 1 INTERMEDIATE Intermediate     CEFTRIAXONE 0.25 SENSITIVE Sensitive     ERYTHROMYCIN >=8 RESISTANT Resistant     LEVOFLOXACIN 2 SENSITIVE Sensitive     VANCOMYCIN 1 SENSITIVE Sensitive     * MODERATE STREPTOCOCCUS SALIVARIUS  Culture, blood (Routine X 2) w Reflex to ID Panel     Status: None (Preliminary result)   Collection Time: 12/14/15  3:29 PM  Result Value Ref Range Status   Specimen Description BLOOD RIGHT HAND  Final   Special Requests BOTTLES DRAWN AEROBIC ONLY 5CC  Final   Culture PENDING  Incomplete   Report Status PENDING  Incomplete  Culture, blood (Routine X 2) w Reflex to ID Panel     Status: None (Preliminary result)   Collection Time: 12/14/15  3:33 PM  Result Value Ref Range Status   Specimen Description BLOOD LEFT HAND  Final   Special Requests IN PEDIATRIC BOTTLE 1.5CC  Final   Culture PENDING  Incomplete   Report Status PENDING  Incomplete     Impression/Recommendation  Active Problems:   Sepsis ([place])   Bile leak   [name] is a 65 y.o. female with  Recent  cholecystectomy with postoperative subjective fevers and low grade temperatures readmitted with fevers, confusion and found now to be bacteremic with Viridans group streptococci and with bile leak and intrabdominal infected bilious abscess sp IR drainage  #1 Viridans group streptococci:  Given predilection for heart valves and given that her malaise symptoms could have been more than a few days would favor checking TEE to ensure no endocarditis  --I have called Cardiology and they will arrange for this next week  --repeat blood cultures to ensure clearance  --narrow to rocephin IV and flagyl po  #2 Infected bilious fluid colleciton: growing S. Salivarious. Will narrow to roccephin and flagyl and DC vancomycin, zosyn    Dr. Johnnye Sima is available this weekend for questions.    12/14/2015, 6:25 PM   Thank you so much for this interesting consult  [place] for [place] [phone] (pager) [phone] (office) 12/14/2015, 6:25 PM  [name] 12/14/2015, 6:25 PM

## 2015-12-14 NOTE — Progress Notes (Signed)
Referring Physician(s): Dr Mamie Laurel  Supervising Physician: Aletta Edouard  Patient Status:  Inpatient  Chief Complaint:  Biloma post cholecystectomy  Subjective:  Drain placed 10/3 ERCP stent placed 10/4 New pancreatitis   Output from drain diminishing 30 cc yesterday; minimal in bag now  Allergies: Shellfish allergy  Medications: Prior to Admission medications   Medication Sig Start Date End Date Taking? Authorizing Provider  atorvastatin (LIPITOR) 40 MG tablet Take 1 tablet (40 mg total) by mouth daily. 01/04/15  Yes Elberta Leatherwood, MD  clopidogrel (PLAVIX) 75 MG tablet TAKE 1 TABLET BY MOUTH EVERY DAY 09/04/15  Yes Elberta Leatherwood, MD  doxepin (SINEQUAN) 100 MG capsule Take 2 capsules (200 mg total) by mouth at bedtime. 10/15/14  Yes Kathrynn Ducking, MD  furosemide (LASIX) 20 MG tablet Take 10 mg by mouth daily. 05/09/14  Yes Historical Provider, MD  gabapentin (NEURONTIN) 600 MG tablet Take 1 tablet (600 mg total) by mouth 2 (two) times daily. 12/05/15  Yes Charlett Blake, MD  lidocaine (LIDODERM) 5 % Place 2 patches onto the skin daily. Remove & Discard patch within 12 hours or as directed by MD 11/28/15  Yes Charlett Blake, MD  meclizine (ANTIVERT) 25 MG tablet Take 1 tablet (25 mg total) by mouth 2 (two) times daily as needed for dizziness. 07/16/15  Yes Elberta Leatherwood, MD  ondansetron (ZOFRAN-ODT) 4 MG disintegrating tablet Take 1 tablet (4 mg total) by mouth every 6 (six) hours as needed for nausea. 12/08/15  Yes Jerrye Beavers, PA-C  oxyCODONE (OXY IR/ROXICODONE) 5 MG immediate release tablet Take 1-2 tablets (5-10 mg total) by mouth every 4 (four) hours as needed for moderate pain. 12/08/15  Yes Brooke A Miller, PA-C  ramipril (ALTACE) 2.5 MG capsule TAKE ONE CAPSULE BY MOUTH EVERY DAY 10/01/15  Yes Elberta Leatherwood, MD  tiZANidine (ZANAFLEX) 2 MG tablet Take 1 tablet (2 mg total) by mouth 3 (three) times daily. 09/12/15  Yes Ward Givens, NP  topiramate (TOPAMAX) 100 MG  tablet TAKE 1 TABLET TWICE A DAY 05/07/15  Yes Kathrynn Ducking, MD  sertraline (ZOLOFT) 50 MG tablet TAKE 1 TABLET (50 MG TOTAL) BY MOUTH 2 (TWO) TIMES DAILY. 12/13/15   Elberta Leatherwood, MD     Vital Signs: BP (!) 145/80 (BP Location: Left Arm)   Pulse 94   Temp 98.7 F (37.1 C) (Oral)   Resp 19   Ht 5\' 7"  (1.702 m)   Wt 166 lb (75.3 kg)   SpO2 95%   BMI 26.00 kg/m   Physical Exam  Constitutional: She is oriented to person, place, and time.  Abdominal: Soft. Bowel sounds are normal. There is no tenderness.  Musculoskeletal: Normal range of motion.  Neurological: She is alert and oriented to person, place, and time.  Skin: Skin is warm and dry.  Site at drain is clean and dry Sl tender No sign of infection Output is bile Minimal in bag  Nursing note and vitals reviewed.   Imaging: Dg Chest 2 View  Result Date: 12/10/2015 CLINICAL DATA:  History of gallbladder surgery last Wednesday. Developed fever and cough on Sunday with right-sided abdominal pain radiating up into chest. EXAM: CHEST  2 VIEW COMPARISON:  Prior chest radiographs dating back through 02/07/2012 FINDINGS: Low lung volumes since previous studies. Trace fluid in the minor fissure on the right with elevated appearance of the right hemidiaphragm which may in fact be due to a moderate-sized subpulmonic effusion.  Slight blunting left lateral costophrenic angle as well presumably from a effusion. The heart and mediastinal contours are stable and within normal limits for size. Mild atherosclerosis of the aortic arch. The patient is status post ACDF of the lower cervical spine. Chronic L1 mild compression. IMPRESSION: Elevated appearance of the right hemidiaphragm which may in fact be due to a moderate subpulmonic pleural effusion as there is also some fluid in the right minor fissure and left costophrenic angle on current exam. Decubitus views (right-side-down) of the chest may be provide more information as to whether this  represents pleural fluid or CT as deemed clinically necessary. Electronically Signed   By: Ashley Royalty M.D.   On: 12/10/2015 19:45   Nm Hepatobiliary Liver Func  Result Date: 12/11/2015 CLINICAL DATA:  65 year old female with a history of cholecystectomy and biloma formation. EXAM: NUCLEAR MEDICINE HEPATOBILIARY IMAGING TECHNIQUE: Sequential images of the abdomen were obtained out to 60 minutes following intravenous administration of radiopharmaceutical. RADIOPHARMACEUTICALS:  5.0 mCi Tc-20m  Choletec IV COMPARISON:  None. FINDINGS: Prompt, uniform hepatic uptake with biliary excretion. Activity within the biliary duct evident at approximately 15 minutes. No focal concentration of radiotracer at the inferior liver margin. IMPRESSION: No focal concentration of radiotracer at the inferior liver margin to confirm biliary leak on the current study. Signed, Dulcy Fanny. Earleen Newport, DO [facility] Radiology Electronically Signed   By: Corrie Mckusick D.O.   On: 12/11/2015 16:16   Ct Abdomen Pelvis W Contrast  Result Date: 12/11/2015 CLINICAL DATA:  Status post cholecystectomy 5 days ago, now with fever, intermittent confusion. Constipation. EXAM: CT ABDOMEN AND PELVIS WITH CONTRAST TECHNIQUE: Multidetector CT imaging of the abdomen and pelvis was performed using the standard protocol following bolus administration of intravenous contrast. CONTRAST:  73mL ISOVUE-300 IOPAMIDOL (ISOVUE-300) INJECTION 61% COMPARISON:  CT abdomen and pelvis June 21, 2013 FINDINGS: LOWER CHEST: Partially imaged small to moderate pleural effusion. Enhancing RIGHT middle lobe and RIGHT lower lobe atelectasis. Trace LEFT pleural effusion and atelectasis. Heart size is normal. Mild coronary artery calcifications . HEPATOBILIARY: Status post cholecystectomy. 7.2 x 3.8 cm fluid collection within the gallbladder fossa, this is contiguous with 5.8 x 11.6 x 15.7 cm (volume = 550 cm^3) hepatic subcapsular  fluid collection. Small amount of fluid extends to Morrison's pouch. Mass effect on the RIGHT lobe of the liver, trace intrahepatic biliary dilatation. No choledocholithiasis. PANCREAS: Normal. SPLEEN: Normal. ADRENALS/URINARY TRACT: Kidneys are orthotopic, demonstrating symmetric enhancement. No nephrolithiasis, hydronephrosis or solid renal masses. The unopacified ureters are normal in course and caliber. Delayed imaging through the kidneys demonstrates symmetric prompt contrast excretion within the proximal urinary collecting system. Urinary bladder is partially distended and unremarkable. Normal adrenal glands. STOMACH/BOWEL: The stomach, small and large bowel are normal in course and caliber without inflammatory changes. Mild amount of retained large bowel stool . Five pill like densities in the cecum. Normal appendix. VASCULAR/LYMPHATIC: Aortoiliac vessels are normal in course and caliber, severe calcific atherosclerosis. Normal appendix. No lymphadenopathy by CT size criteria. REPRODUCTIVE: Status post hysterectomy. OTHER: Small amount of low-density free fluid in the pelvis. No intraperitoneal free air. MUSCULOSKELETAL: Nonacute. Mild chronic L1 compression fracture. Severe L5-S1 degenerative disc and severe L5-S1 neural foraminal narrowing. IMPRESSION: Status post recent cholecystectomy with fluid collection within the gallbladder fossa contiguous with hepatic subcapsular 5.8 x 11.6 x 15.7 cm fluid collection compatible with biloma. Superimposed infection possible. Small amount of free fluid in the pelvis. Small to moderate RIGHT pleural effusion, trace on  the LEFT with compressive atelectasis. Severe atherosclerosis. Acute findings discussed with and reconfirmed by [facility] NANAVATI on 12/11/2015 at 4:30 am. Electronically Signed   By: Elon Alas M.D.   On: 12/11/2015 04:33   Dg Ercp Biliary & Pancreatic Ducts  Result Date: 12/12/2015 CLINICAL DATA:  65 year old female with a history of prior  cholecystectomy and biloma. Concern for biliary leak EXAM: ERCP TECHNIQUE: Multiple spot images obtained with the fluoroscopic device and submitted for interpretation post-procedure. FLUOROSCOPY TIME:  2 minutes 57 seconds COMPARISON:  Nuclear medicine study 12/11/2015, CT 12/11/2015 FINDINGS: Limited intraoperative fluoroscopic spot images during ERCP. Initial image demonstrates endoscope projecting over the upper abdomen with cannulation of the ampulla and retrograde infusion of contrast. Pigtail drainage catheter at the inferior liver margin is noted adjacent to surgical clips. During the course of the image, there is infusion of contrast an extraluminal location at the inferior liver margin adjacent to the pigtail drainage catheter. Final image demonstrates placement of a stent. IMPRESSION: ERCP demonstrates evidence of biliary leak at the inferior liver margin, with subsequent placement of biliary stent. Please refer to the dictated operative report for full details of intraoperative findings and procedure. Signed, Dulcy Fanny. Earleen Newport, DO [facility] Radiology Electronically Signed   By: Corrie Mckusick D.O.   On: 12/12/2015 14:32   [place] Image Guided Drainage By Percutaneous Catheter  Result Date: 12/11/2015 INDICATION: 65 year old female with fluid collection after cholecystectomy. EXAM: [place] IMAGE GUIDED DRAINAGE BY PERCUTANEOUS CATHETE MEDICATIONS: The patient is currently admitted to the hospital and receiving intravenous antibiotics. The antibiotics were administered within an appropriate time frame prior to the initiation of the procedure. ANESTHESIA/SEDATION: Fentanyl 50 mcg IV; Versed 1.0 mg IV Moderate Sedation Time:  15 minutes The patient was continuously monitored during the procedure by the interventional radiology nurse under my direct supervision. COMPLICATIONS: None PROCEDURE: Informed written consent was obtained from the patient after a thorough  discussion of the procedural risks, benefits and alternatives. All questions were addressed. Maximal Sterile Barrier Technique was utilized including caps, mask, sterile gowns, sterile gloves, sterile drape, hand hygiene and skin antiseptic. A timeout was performed prior to the initiation of the procedure. Patient positioned supine position on the ultrasound table. Scout images of the upper abdomen were performed for planning purposes. Patient was then prepped and draped in the usual sterile fashion. The skin and subcutaneous tissues were generously infiltrated 1% lidocaine for local anesthesia. Small stab incision was made with 11 blade scalpel. Using ultrasound guidance, trocar technique was used to advance a 10 [place] drain into the subhepatic fluid collection. Once the drain was in place and confirmed in position, spontaneous ileus fluid returned. Approximately 290 cc of ileus fluid was aspirated. Sample was aspirated for the lab. Catheter was sutured in position and a final image was stored. Catheter attached to gravity drainage. Patient tolerated the procedure well and remained hemodynamically stable throughout. No complications were encountered and no significant blood loss encountered. IMPRESSION: Status post ultrasound-guided drainage of subhepatic fluid collection with approximately 290 cc of ileus fluid removed. Sample was sent the lab for culture. Signed, Dulcy Fanny. Earleen Newport, DO [facility] Radiology Electronically Signed   By: Corrie Mckusick D.O.   On: 12/11/2015 15:03    Labs:  CBC:  Recent Labs  12/10/15 1850 12/12/15 0241 12/13/15 0444 12/14/15 0453  WBC 11.2* 12.7* 11.4* 16.2*  HGB 11.4* 9.9* 10.3* 11.3*  HCT 34.9* 30.6* 31.4* 34.6*  PLT 320 297  373 578*    COAGS:  Recent Labs  12/05/15 0406 12/11/15 0729  INR 1.03 1.10  APTT 33  --     BMP:  Recent Labs  12/10/15 1850 12/12/15 0241 12/13/15 0444 12/14/15 0453  NA 133*  139 140 137  K 3.2* 3.4* 2.7* 3.1*  CL 105 107 105 105  CO2 18* 24 23 20*  GLUCOSE 111* 97 111* 108*  BUN 6 10 9  <5*  CALCIUM 8.2* 7.9* 7.6* 8.0*  CREATININE 0.87 0.86 0.77 0.87  GFRNONAA >60 >60 >60 >60  GFRAA >60 >60 >60 >60    LIVER FUNCTION TESTS:  Recent Labs  12/10/15 1850 12/12/15 0241 12/13/15 0444 12/14/15 0453  BILITOT 0.9 0.8 0.8 0.6  AST 34 27 30 43*  ALT 45 28 26 31   ALKPHOS 147* 125 125 148*  PROT 6.5 5.0* 5.3* 6.3*  ALBUMIN 2.9* 2.0* 1.9* 2.3*    Assessment and Plan:  Biloma drain intact Will follow  Electronically Signed: Danaisha Celli A 12/14/2015, 11:24 AM   I spent a total of 15 Minutes at the the patient's bedside AND on the patient's hospital floor or unit, greater than 50% of which was counseling/coordinating care for biloma drain

## 2015-12-14 NOTE — Progress Notes (Signed)
Daily Rounding Note  12/14/2015, 10:34 AM  LOS: 3 days   SUBJECTIVE:   Chief complaint: Abdominal pain, malaise. Abdominal pain about the same.  Dilaudid is helping.  Diet advanced to soft this morning.  However she is not hungry, has mild nausea and idea of solid food not appealing.   IV fluids remain at normal saline, 125 ml/hr.  Urinating well.   OBJECTIVE:         Vital signs in last 24 hours:    Temp:  [97.7 F (36.5 C)-99.8 F (37.7 C)] 98.7 F (37.1 C) (10/06 0420) Pulse Rate:  [94-100] 94 (10/06 0420) Resp:  [18-22] 19 (10/06 0420) BP: (145-150)/(70-81) 145/80 (10/06 0420) SpO2:  [95 %-100 %] 95 % (10/06 0420) Last BM Date: 12/13/15 Filed Weights   12/10/15 1837 12/10/15 1847  Weight: 74.8 kg (165 lb) 75.3 kg (166 lb)   General: looks better and more comfortable.   Heart: RRR Chest: clear bil.  No dyspnea or cough Abdomen: soft, tender, BS hypoactive.  Medium, brown, minimal clear, bilious material in the biloma drain Extremities: no CCE Neuro/Psych:  Pleasant, alert, calm.  No gross deficits or weakness.   Intake/Output from previous day: 10/05 0701 - 10/06 0700 In: 2451.3 [P.O.:240; I.V.:2156.3; IV Piggyback:50] Out: 30 [Drains:30]  Intake/Output this shift: Total I/O In: 240 [P.O.:240] Out: -   Lab Results:  Recent Labs  12/12/15 0241 12/13/15 0444 12/14/15 0453  WBC 12.7* 11.4* 16.2*  HGB 9.9* 10.3* 11.3*  HCT 30.6* 31.4* 34.6*  PLT 297 373 578*   BMET  Recent Labs  12/12/15 0241 12/13/15 0444 12/14/15 0453  NA 139 140 137  K 3.4* 2.7* 3.1*  CL 107 105 105  CO2 24 23 20*  GLUCOSE 97 111* 108*  BUN 10 9 <5*  CREATININE 0.86 0.77 0.87  CALCIUM 7.9* 7.6* 8.0*   LFT  Recent Labs  12/12/15 0241 12/13/15 0444 12/14/15 0453  PROT 5.0* 5.3* 6.3*  ALBUMIN 2.0* 1.9* 2.3*  AST 27 30 43*  ALT 28 26 31   ALKPHOS 125 125 148*  BILITOT 0.8 0.8 0.6   PT/INR No results for  input(s): LABPROT, INR in the last 72 hours. Hepatitis Panel No results for input(s): HEPBSAG, HCVAB, HEPAIGM, HEPBIGM in the last 72 hours.  Studies/Results: Dg Ercp Biliary & Pancreatic Ducts  Result Date: 12/12/2015 CLINICAL DATA:  65 year old female with a history of prior cholecystectomy and biloma. Concern for biliary leak EXAM: ERCP TECHNIQUE: Multiple spot images obtained with the fluoroscopic device and submitted for interpretation post-procedure. FLUOROSCOPY TIME:  2 minutes 57 seconds COMPARISON:  Nuclear medicine study 12/11/2015, CT 12/11/2015 FINDINGS: Limited intraoperative fluoroscopic spot images during ERCP. Initial image demonstrates endoscope projecting over the upper abdomen with cannulation of the ampulla and retrograde infusion of contrast. Pigtail drainage catheter at the inferior liver margin is noted adjacent to surgical clips. During the course of the image, there is infusion of contrast an extraluminal location at the inferior liver margin adjacent to the pigtail drainage catheter. Final image demonstrates placement of a stent. IMPRESSION: ERCP demonstrates evidence of biliary leak at the inferior liver margin, with subsequent placement of biliary stent. Please refer to the dictated operative report for full details of intraoperative findings and procedure. Signed, Dulcy Fanny. Earleen Newport, DO [facility] Radiology Electronically Signed   By: Corrie Mckusick D.O.   On: 12/12/2015 14:32   Scheduled Meds: . docusate sodium  100  mg Oral BID  . enoxaparin (LOVENOX) injection  40 mg Subcutaneous Q24H  . famotidine  20 mg Oral BID  . piperacillin-tazobactam (ZOSYN)  IV  3.375 g Intravenous Q8H  . potassium chloride  40 mEq Oral BID  . senna  1 tablet Oral BID  . vancomycin  1,000 mg Intravenous Q12H   Continuous Infusions: . sodium chloride 125 mL/hr at 12/13/15 1650   PRN Meds:.acetaminophen **OR** acetaminophen, bisacodyl,  HYDROmorphone (DILAUDID) injection, ondansetron **OR** ondansetron (ZOFRAN) IV, oxyCODONE, technetium TC 72M mebrofenin   ASSESMENT:   *  Post chole bile leak. IR placed catheter drain to biloma 10/3.  ERCP 10/4: leak from right intrahepatic branch duct, no cystic duct leak.  S/p plastic stent placement to CBD. Volume of biliary drainage from biloma catheter decreasing.   On vanc/zosyn but WBCs climbing,  likely due to pancreatitis.  No fever.     *  Pancreatitis, post ERCP. Pancreas was normal on CT scan of 12/11/15.    *  Hypokalemia. Renal function ok.    PLAN   *  Supportive care.  Regress diet back to full liquids.   *  Change IVF to LR with KCl.  Increase rate to 150/hr.  Strict I/Os.   *  No repeat abd/pelvic CT as of yet but consider this if pt fails to improve of becomes unstable.     *  Lipase in AM.        Azucena Freed  12/14/2015, 10:34 AM Pager: [phone]

## 2015-12-15 ENCOUNTER — Inpatient Hospital Stay (HOSPITAL_COMMUNITY): Payer: BLUE CROSS/BLUE SHIELD

## 2015-12-15 ENCOUNTER — Encounter (HOSPITAL_COMMUNITY): Payer: Self-pay | Admitting: Radiology

## 2015-12-15 LAB — CBC
HCT: 31.4 % — ABNORMAL LOW (ref 36.0–46.0)
Hemoglobin: 10.3 g/dL — ABNORMAL LOW (ref 12.0–15.0)
MCH: 29.3 pg (ref 26.0–34.0)
MCHC: 32.8 g/dL (ref 30.0–36.0)
MCV: 89.2 fL (ref 78.0–100.0)
Platelets: ADEQUATE 10*3/uL (ref 150–400)
RBC: 3.52 MIL/uL — ABNORMAL LOW (ref 3.87–5.11)
RDW: 15.4 % (ref 11.5–15.5)
WBC: 11.7 10*3/uL — ABNORMAL HIGH (ref 4.0–10.5)

## 2015-12-15 LAB — LIPASE, BLOOD: Lipase: 81 U/L — ABNORMAL HIGH (ref 11–51)

## 2015-12-15 LAB — BASIC METABOLIC PANEL
Anion gap: 9 (ref 5–15)
BUN: <5 mg/dL — ABNORMAL LOW (ref 6–20)
CO2: 22 mmol/L (ref 22–32)
Calcium: 7.8 mg/dL — ABNORMAL LOW (ref 8.9–10.3)
Chloride: 107 mmol/L (ref 101–111)
Creatinine, Ser: 0.73 mg/dL (ref 0.44–1.00)
GFR calc Af Amer: >60 mL/min (ref >60)
GFR calc non Af Amer: >60 mL/min (ref >60)
Glucose, Bld: 102 mg/dL — ABNORMAL HIGH (ref 65–99)
Potassium: 4.8 mmol/L (ref 3.5–5.1)
Sodium: 138 mmol/L (ref 135–145)

## 2015-12-15 MED ORDER — IOPAMIDOL (ISOVUE-300) INJECTION 61%
INTRAVENOUS | Status: AC
  Administered 2015-12-15: 100 mL
  Filled 2015-12-15: qty 100

## 2015-12-15 MED ORDER — IOPAMIDOL (ISOVUE-300) INJECTION 61%
INTRAVENOUS | Status: AC
  Administered 2015-12-15: 16:00:00
  Filled 2015-12-15: qty 30

## 2015-12-15 NOTE — Anesthesia Postprocedure Evaluation (Signed)
Anesthesia Post Note  Patient: [name]  Procedure(s) Performed: Procedure(s) (LRB): ENDOSCOPIC RETROGRADE CHOLANGIOPANCREATOGRAPHY (ERCP) (N/A) BILIARY STENT PLACEMENT (N/A)  Patient location during evaluation: PACU Anesthesia Type: General Level of consciousness: awake Pain management: pain level controlled Vital Signs Assessment: post-procedure vital signs reviewed and stable Respiratory status: spontaneous breathing Cardiovascular status: stable Postop Assessment: no signs of nausea or vomiting Anesthetic complications: no    Last Vitals:  Vitals:   12/14/15 2116 12/15/15 0525  BP: (!) 143/85 137/82  Pulse: 99 (!) 104  Resp: 19 17  Temp: 37.1 C 36.9 C    Last Pain:  Vitals:   12/15/15 1258  TempSrc:   PainSc: 6                  Menashe Kafer

## 2015-12-15 NOTE — Progress Notes (Signed)
Echocardiogram to be done Sunday, per vascular tech.

## 2015-12-15 NOTE — Progress Notes (Signed)
Patient ID: [name], female   DOB: 04/20/1950, 65 y.o.   MRN: [id number]    Progress Note   Subjective   Still having a lot of pain, hurts to breathe, move, upper abdomen and right shoulder   Lipase down to 81 BC + strep Viridans    Objective   Vital signs in last 24 hours: Temp:  [98.5 F (36.9 C)-99.8 F (37.7 C)] 98.5 F (36.9 C) (10/07 0525) Pulse Rate:  [99-104] 104 (10/07 0525) Resp:  [17-19] 17 (10/07 0525) BP: (137-143)/(77-85) 137/82 (10/07 0525) SpO2:  [95 %-97 %] 95 % (10/07 0525) Last BM Date: 12/14/15 General:    white female in NAD Heart:  Regular rate and rhythm; no murmurs Lungs: Respirations even and unlabored, lungs CTA bilaterally Abdomen:  Soft, tender and nondistended. Bile in drain  Extremities:  Without edema. Neurologic:  Alert and oriented,  grossly normal neurologically. Psych:  Cooperative. Normal mood and affect.  Intake/Output from previous day: 10/06 0701 - 10/07 0700 In: 3707.5 [P.O.:720; I.V.:2937.5; IV Piggyback:50] Out: 2390 [Urine:2300; Drains:90] Intake/Output this shift: Total I/O In: 0  Out: 400 [Urine:300; Drains:100]  Lab Results:  Recent Labs  12/13/15 0444 12/14/15 0453 12/15/15 0312  WBC 11.4* 16.2* 11.7*  HGB 10.3* 11.3* 10.3*  HCT 31.4* 34.6* 31.4*  PLT 373 578* PLATELET CLUMPS NOTED ON SMEAR, COUNT APPEARS ADEQUATE   BMET  Recent Labs  12/13/15 0444 12/14/15 0453 12/15/15 0312  NA 140 137 138  K 2.7* 3.1* 4.8  CL 105 105 107  CO2 23 20* 22  GLUCOSE 111* 108* 102*  BUN 9 <5* <5*  CREATININE 0.77 0.87 0.73  CALCIUM 7.6* 8.0* 7.8*   LFT  Recent Labs  12/14/15 0453  PROT 6.3*  ALBUMIN 2.3*  AST 43*  ALT 31  ALKPHOS 148*  BILITOT 0.6   PT/INR No results for input(s): LABPROT, INR in the last 72 hours.       Assessment / Plan:    #1  65 yo female s/p lap chole 9/28 Bile leak 10/3, s/p ERCP stent 10/4 Mild post ERCP pancreatitis - lipase almost back to normal today Persistent  pain - r/o biloma - for repeat CT today  #3 Bacteremia- strep viridans- on Rocephin/Flagyl  Await CT findings,  Active Problems:   Sepsis ([place])   Bile leak   Biloma   Biloma following surgery   Status post cholecystectomy   Infection by Streptococcus, viridans group   Routine screening for STI (sexually transmitted infection)     LOS: 4 days   Aladdin Kollmann  12/15/2015, 10:35 AM

## 2015-12-15 NOTE — Progress Notes (Signed)
3 Days Post-Op  Subjective: Stable and alert.  Afebrile.  Feels somewhat better Still anorexic Notes some right upper quadrant pain and right shoulder pain persists.  Clear bile in drain.  100 mL out last 12 hours. WBC 11,700.  Creatinine 0.73.  Glucose 102.  Lipase 81. K 4.8  Objective: Vital signs in last 24 hours: Temp:  [98.5 F (36.9 C)-99.8 F (37.7 C)] 98.5 F (36.9 C) (10/07 0525) Pulse Rate:  [99-104] 104 (10/07 0525) Resp:  [17-19] 17 (10/07 0525) BP: (137-143)/(77-85) 137/82 (10/07 0525) SpO2:  [95 %-97 %] 95 % (10/07 0525) Last BM Date: 12/14/15  Intake/Output from previous day: 10/06 0701 - 10/07 0700 In: 3707.5 [P.O.:720; I.V.:2937.5; IV Piggyback:50] Out: 2390 [Urine:2300; Drains:90] Intake/Output this shift: Total I/O In: -  Out: 400 [Urine:300; Drains:100]  General appearance: Alert.  Pleasant.  Minimal distress.  Appropriate. Resp: Lungs sound fairly clear.  No wheeze or rhonchi at right base GI: Soft.  Some tenderness epigastrium and right upper quadrant.  Lower abdomen soft nontender.  Clear bile in drainage tube.  Significant amount.  Lab Results:   Recent Labs  12/14/15 0453 12/15/15 0312  WBC 16.2* 11.7*  HGB 11.3* 10.3*  HCT 34.6* 31.4*  PLT 578* PLATELET CLUMPS NOTED ON SMEAR, COUNT APPEARS ADEQUATE   BMET  Recent Labs  12/14/15 0453 12/15/15 0312  NA 137 138  K 3.1* 4.8  CL 105 107  CO2 20* 22  GLUCOSE 108* 102*  BUN <5* <5*  CREATININE 0.87 0.73  CALCIUM 8.0* 7.8*   PT/INR No results for input(s): LABPROT, INR in the last 72 hours. ABG No results for input(s): PHART, HCO3 in the last 72 hours.  Invalid input(s): PCO2, PO2  Studies/Results: No results found.  Anti-infectives: Anti-infectives    Start     Dose/Rate Route Frequency Ordered Stop   12/14/15 1445  cefTRIAXone (ROCEPHIN) 2 g in dextrose 5 % 50 mL IVPB     2 g 100 mL/hr over 30 Minutes Intravenous Every 24 hours 12/14/15 1435     12/14/15 1445   metroNIDAZOLE (FLAGYL) tablet 500 mg     500 mg Oral Every 8 hours 12/14/15 1435     12/12/15 0930  vancomycin (VANCOCIN) IVPB 1000 mg/200 mL premix  Status:  Discontinued     1,000 mg 200 mL/hr over 60 Minutes Intravenous Every 12 hours 12/12/15 0913 12/14/15 1435   12/11/15 0800  piperacillin-tazobactam (ZOSYN) IVPB 3.375 g  Status:  Discontinued     3.375 g 12.5 mL/hr over 240 Minutes Intravenous Every 8 hours 12/11/15 0536 12/14/15 1435   12/11/15 0045  piperacillin-tazobactam (ZOSYN) IVPB 3.375 g     3.375 g 100 mL/hr over 30 Minutes Intravenous  Once 12/11/15 0038 12/11/15 0339      Assessment/Plan: s/p Procedure(s): ENDOSCOPIC RETROGRADE CHOLANGIOPANCREATOGRAPHY (ERCP) BILIARY STENT PLACEMENT  - s/p lap chole with IOC 12/06/15 Dr. Barry Dienes - returned to ED 12/11/15 with postop bile leak - RUQ drain placed 12/11/15 -  s/p stent placement 12/12/15, Dr. Loletha Carrow - culture showed gram positive and gram negative rods; Viridans streptococcus; started on vanc 12/12/15 - afebrile, WBC down -Drain output significant.  Shoulder pain. Question whether drain and ERCP has completely controlled this. -Repeat CT abdomen and pelvis today to rule out undrained fluid collection  -Labs tomorrow  Hypokalemia - resolved  ID- zosyn 10/3>>, vanc 10/4>> FEN - heart healthy, IVF VTE - SCDs, lovenox (on eliquis preop)  Plan - PO pain control Advance to soft diet  LOS: 4 days    [name] M 12/15/2015

## 2015-12-15 NOTE — Progress Notes (Signed)
Referring Physician(s): Dr Mamie Laurel  Supervising Physician: Corrie Mckusick  Patient Status:  Inpatient  Chief Complaint:  Post chole biloma drain  Subjective:  Drain placed 10/3 ERCP stent placed 10/4 Doing well Better daily Output still significant today   Allergies: Shellfish allergy  Medications: Prior to Admission medications   Medication Sig Start Date End Date Taking? Authorizing Provider  atorvastatin (LIPITOR) 40 MG tablet Take 1 tablet (40 mg total) by mouth daily. 01/04/15  Yes Elberta Leatherwood, MD  clopidogrel (PLAVIX) 75 MG tablet TAKE 1 TABLET BY MOUTH EVERY DAY 09/04/15  Yes Elberta Leatherwood, MD  doxepin (SINEQUAN) 100 MG capsule Take 2 capsules (200 mg total) by mouth at bedtime. 10/15/14  Yes Kathrynn Ducking, MD  furosemide (LASIX) 20 MG tablet Take 10 mg by mouth daily. 05/09/14  Yes Historical Provider, MD  gabapentin (NEURONTIN) 600 MG tablet Take 1 tablet (600 mg total) by mouth 2 (two) times daily. 12/05/15  Yes Charlett Blake, MD  lidocaine (LIDODERM) 5 % Place 2 patches onto the skin daily. Remove & Discard patch within 12 hours or as directed by MD 11/28/15  Yes Charlett Blake, MD  meclizine (ANTIVERT) 25 MG tablet Take 1 tablet (25 mg total) by mouth 2 (two) times daily as needed for dizziness. 07/16/15  Yes Elberta Leatherwood, MD  ondansetron (ZOFRAN-ODT) 4 MG disintegrating tablet Take 1 tablet (4 mg total) by mouth every 6 (six) hours as needed for nausea. 12/08/15  Yes Jerrye Beavers, PA-C  oxyCODONE (OXY IR/ROXICODONE) 5 MG immediate release tablet Take 1-2 tablets (5-10 mg total) by mouth every 4 (four) hours as needed for moderate pain. 12/08/15  Yes Brooke A Miller, PA-C  ramipril (ALTACE) 2.5 MG capsule TAKE ONE CAPSULE BY MOUTH EVERY DAY 10/01/15  Yes Elberta Leatherwood, MD  tiZANidine (ZANAFLEX) 2 MG tablet Take 1 tablet (2 mg total) by mouth 3 (three) times daily. 09/12/15  Yes Ward Givens, NP  topiramate (TOPAMAX) 100 MG tablet TAKE 1 TABLET TWICE A DAY  05/07/15  Yes Kathrynn Ducking, MD  sertraline (ZOLOFT) 50 MG tablet TAKE 1 TABLET (50 MG TOTAL) BY MOUTH 2 (TWO) TIMES DAILY. 12/13/15   Elberta Leatherwood, MD     Vital Signs: BP 137/82   Pulse (!) 104   Temp 98.5 F (36.9 C)   Resp 17   Ht 5\' 7"  (1.702 m)   Wt 166 lb (75.3 kg)   SpO2 95%   BMI 26.00 kg/m   Physical Exam  Constitutional: She is oriented to person, place, and time.  Abdominal: Soft. Bowel sounds are normal. There is tenderness.  Neurological: She is alert and oriented to person, place, and time.  Skin: Skin is warm and dry.  Site of drain is clean and dry Tender  No sign of infection Output 90 cc yesterday 25 cc in bag Bilious Cx: + strept salivarius  Nursing note and vitals reviewed.   Imaging: Nm Hepatobiliary Liver Func  Result Date: 12/11/2015 CLINICAL DATA:  65 year old female with a history of cholecystectomy and biloma formation. EXAM: NUCLEAR MEDICINE HEPATOBILIARY IMAGING TECHNIQUE: Sequential images of the abdomen were obtained out to 60 minutes following intravenous administration of radiopharmaceutical. RADIOPHARMACEUTICALS:  5.0 mCi Tc-42m  Choletec IV COMPARISON:  None. FINDINGS: Prompt, uniform hepatic uptake with biliary excretion. Activity within the biliary duct evident at approximately 15 minutes. No focal concentration of radiotracer at the inferior liver margin. IMPRESSION: No focal concentration of radiotracer at  the inferior liver margin to confirm biliary leak on the current study. Signed, Dulcy Fanny. Earleen Newport, DO [facility] Radiology Electronically Signed   By: Corrie Mckusick D.O.   On: 12/11/2015 16:16   Dg Ercp Biliary & Pancreatic Ducts  Result Date: 12/12/2015 CLINICAL DATA:  65 year old female with a history of prior cholecystectomy and biloma. Concern for biliary leak EXAM: ERCP TECHNIQUE: Multiple spot images obtained with the fluoroscopic device and submitted for interpretation  post-procedure. FLUOROSCOPY TIME:  2 minutes 57 seconds COMPARISON:  Nuclear medicine study 12/11/2015, CT 12/11/2015 FINDINGS: Limited intraoperative fluoroscopic spot images during ERCP. Initial image demonstrates endoscope projecting over the upper abdomen with cannulation of the ampulla and retrograde infusion of contrast. Pigtail drainage catheter at the inferior liver margin is noted adjacent to surgical clips. During the course of the image, there is infusion of contrast an extraluminal location at the inferior liver margin adjacent to the pigtail drainage catheter. Final image demonstrates placement of a stent. IMPRESSION: ERCP demonstrates evidence of biliary leak at the inferior liver margin, with subsequent placement of biliary stent. Please refer to the dictated operative report for full details of intraoperative findings and procedure. Signed, Dulcy Fanny. Earleen Newport, DO [facility] Radiology Electronically Signed   By: Corrie Mckusick D.O.   On: 12/12/2015 14:32   [place] Image Guided Drainage By Percutaneous Catheter  Result Date: 12/11/2015 INDICATION: 65 year old female with fluid collection after cholecystectomy. EXAM: [place] IMAGE GUIDED DRAINAGE BY PERCUTANEOUS CATHETE MEDICATIONS: The patient is currently admitted to the hospital and receiving intravenous antibiotics. The antibiotics were administered within an appropriate time frame prior to the initiation of the procedure. ANESTHESIA/SEDATION: Fentanyl 50 mcg IV; Versed 1.0 mg IV Moderate Sedation Time:  15 minutes The patient was continuously monitored during the procedure by the interventional radiology nurse under my direct supervision. COMPLICATIONS: None PROCEDURE: Informed written consent was obtained from the patient after a thorough discussion of the procedural risks, benefits and alternatives. All questions were addressed. Maximal Sterile Barrier Technique was utilized including caps, mask, sterile  gowns, sterile gloves, sterile drape, hand hygiene and skin antiseptic. A timeout was performed prior to the initiation of the procedure. Patient positioned supine position on the ultrasound table. Scout images of the upper abdomen were performed for planning purposes. Patient was then prepped and draped in the usual sterile fashion. The skin and subcutaneous tissues were generously infiltrated 1% lidocaine for local anesthesia. Small stab incision was made with 11 blade scalpel. Using ultrasound guidance, trocar technique was used to advance a 10 [place] drain into the subhepatic fluid collection. Once the drain was in place and confirmed in position, spontaneous ileus fluid returned. Approximately 290 cc of ileus fluid was aspirated. Sample was aspirated for the lab. Catheter was sutured in position and a final image was stored. Catheter attached to gravity drainage. Patient tolerated the procedure well and remained hemodynamically stable throughout. No complications were encountered and no significant blood loss encountered. IMPRESSION: Status post ultrasound-guided drainage of subhepatic fluid collection with approximately 290 cc of ileus fluid removed. Sample was sent the lab for culture. Signed, Dulcy Fanny. Earleen Newport, DO [facility] Radiology Electronically Signed   By: Corrie Mckusick D.O.   On: 12/11/2015 15:03    Labs:  CBC:  Recent Labs  12/12/15 0241 12/13/15 0444 12/14/15 0453 12/15/15 0312  WBC 12.7* 11.4* 16.2* 11.7*  HGB 9.9* 10.3* 11.3* 10.3*  HCT 30.6* 31.4* 34.6* 31.4*  PLT 297 373 578* PLATELET CLUMPS NOTED ON SMEAR, COUNT APPEARS ADEQUATE    COAGS:  Recent Labs  12/05/15 0406 12/11/15 0729  INR 1.03 1.10  APTT 33  --     BMP:  Recent Labs  12/12/15 0241 12/13/15 0444 12/14/15 0453 12/15/15 0312  NA 139 140 137 138  K 3.4* 2.7* 3.1* 4.8  CL 107 105 105 107  CO2 24 23 20* 22  GLUCOSE 97 111* 108* 102*  BUN 10 9 <5*  <5*  CALCIUM 7.9* 7.6* 8.0* 7.8*  CREATININE 0.86 0.77 0.87 0.73  GFRNONAA >60 >60 >60 >60  GFRAA >60 >60 >60 >60    LIVER FUNCTION TESTS:  Recent Labs  12/10/15 1850 12/12/15 0241 12/13/15 0444 12/14/15 0453  BILITOT 0.9 0.8 0.8 0.6  AST 34 27 30 43*  ALT 45 28 26 31   ALKPHOS 147* 125 125 148*  PROT 6.5 5.0* 5.3* 6.3*  ALBUMIN 2.9* 2.0* 1.9* 2.3*    Assessment and Plan:  Biloma post cholecystectomy Drain intact Output up from yesterday + strept salivarius Will follow  Electronically Signed: Sapphire Tygart A 12/15/2015, 8:38 AM   I spent a total of 15 Minutes at the the patient's bedside AND on the patient's hospital floor or unit, greater than 50% of which was counseling/coordinating care for biloma drain

## 2015-12-16 DIAGNOSIS — Z9049 Acquired absence of other specified parts of digestive tract: Secondary | ICD-10-CM

## 2015-12-16 LAB — CULTURE, BLOOD (ROUTINE X 2)

## 2015-12-16 LAB — COMPREHENSIVE METABOLIC PANEL
ALT: 36 U/L (ref 14–54)
AST: 45 U/L — ABNORMAL HIGH (ref 15–41)
Albumin: 1.8 g/dL — ABNORMAL LOW (ref 3.5–5.0)
Alkaline Phosphatase: 132 U/L — ABNORMAL HIGH (ref 38–126)
Anion gap: 10 (ref 5–15)
BUN: <5 mg/dL — ABNORMAL LOW (ref 6–20)
CO2: 25 mmol/L (ref 22–32)
Calcium: 8.2 mg/dL — ABNORMAL LOW (ref 8.9–10.3)
Chloride: 103 mmol/L (ref 101–111)
Creatinine, Ser: 0.71 mg/dL (ref 0.44–1.00)
GFR calc Af Amer: >60 mL/min (ref >60)
GFR calc non Af Amer: >60 mL/min (ref >60)
Glucose, Bld: 98 mg/dL (ref 65–99)
Potassium: 4.1 mmol/L (ref 3.5–5.1)
Sodium: 138 mmol/L (ref 135–145)
Total Bilirubin: 0.4 mg/dL (ref 0.3–1.2)
Total Protein: 5.3 g/dL — ABNORMAL LOW (ref 6.5–8.1)

## 2015-12-16 LAB — CBC
HCT: 30.8 % — ABNORMAL LOW (ref 36.0–46.0)
Hemoglobin: 9.9 g/dL — ABNORMAL LOW (ref 12.0–15.0)
MCH: 28.8 pg (ref 26.0–34.0)
MCHC: 32.1 g/dL (ref 30.0–36.0)
MCV: 89.5 fL (ref 78.0–100.0)
Platelets: 500 10*3/uL — ABNORMAL HIGH (ref 150–400)
RBC: 3.44 MIL/uL — ABNORMAL LOW (ref 3.87–5.11)
RDW: 15.4 % (ref 11.5–15.5)
WBC: 15.4 10*3/uL — ABNORMAL HIGH (ref 4.0–10.5)

## 2015-12-16 LAB — AEROBIC/ANAEROBIC CULTURE (SURGICAL/DEEP WOUND): Gram Stain: NONE SEEN

## 2015-12-16 LAB — LIPASE, BLOOD: Lipase: 47 U/L (ref 11–51)

## 2015-12-16 LAB — AEROBIC/ANAEROBIC CULTURE W GRAM STAIN (SURGICAL/DEEP WOUND)

## 2015-12-16 MED ORDER — ENOXAPARIN SODIUM 40 MG/0.4ML ~~LOC~~ SOLN
40.0000 mg | SUBCUTANEOUS | Status: DC
Start: 2015-12-18 — End: 2015-12-26
  Administered 2015-12-18 – 2015-12-26 (×9): 40 mg via SUBCUTANEOUS
  Filled 2015-12-16 (×9): qty 0.4

## 2015-12-16 NOTE — Progress Notes (Signed)
Patient ID: [name], female   DOB: 24-Nov-1950, 65 y.o.   MRN: [id number]    Progress Note   Subjective   Still having severe pain, vomited last night so not eating  Biliary drain output 1465 past 24 hours- significant increase  CT results as below- persistent large collection in subcapsular area 13 cm , stent in position and drain in subhepatic space    Objective   Vital signs in last 24 hours: Temp:  [97.9 F (36.6 C)-99.1 F (37.3 C)] 97.9 F (36.6 C) (10/08 0458) Pulse Rate:  [95-103] 102 (10/08 0458) Resp:  [17-18] 17 (10/08 0458) BP: (140-157)/(78-85) 140/78 (10/08 0458) SpO2:  [94 %-96 %] 95 % (10/08 0458) Last BM Date: 12/16/15 General: older    white female in NAD Heart:  Regular rate and rhythm; no murmurs Lungs: Respirations even and unlabored, lungs CTA bilaterally Abdomen:  Soft, tender ., BS decreased , clear bile in drain Extremities:  Without edema. Neurologic:  Alert and oriented,  grossly normal neurologically. Psych:  Cooperative. Normal mood and affect.  Intake/Output from previous day: 10/07 0701 - 10/08 0700 In: 3935 [P.O.:300; I.V.:2725; IV Piggyback:50] Out: 2965 [Urine:1500; Drains:1465] Intake/Output this shift: No intake/output data recorded.  Lab Results:  Recent Labs  12/14/15 0453 12/15/15 0312 12/16/15 0241  WBC 16.2* 11.7* 15.4*  HGB 11.3* 10.3* 9.9*  HCT 34.6* 31.4* 30.8*  PLT 578* PLATELET CLUMPS NOTED ON SMEAR, COUNT APPEARS ADEQUATE 500*   BMET  Recent Labs  12/14/15 0453 12/15/15 0312 12/16/15 0241  NA 137 138 138  K 3.1* 4.8 4.1  CL 105 107 103  CO2 20* 22 25  GLUCOSE 108* 102* 98  BUN <5* <5* <5*  CREATININE 0.87 0.73 0.71  CALCIUM 8.0* 7.8* 8.2*   LFT  Recent Labs  12/16/15 0241  PROT 5.3*  ALBUMIN 1.8*  AST 45*  ALT 36  ALKPHOS 132*  BILITOT 0.4   PT/INR No results for input(s): LABPROT, INR in the last 72 hours.  Studies/Results: Ct Abdomen Pelvis W Contrast  Result Date:  12/15/2015 CLINICAL DATA:  Right upper quadrant abdominal pain. Patient is 2 weeks post cholecystectomy with bile leak. EXAM: CT ABDOMEN AND PELVIS WITH CONTRAST TECHNIQUE: Multidetector CT imaging of the abdomen and pelvis was performed using the standard protocol following bolus administration of intravenous contrast. CONTRAST:  190mL ISOVUE-300 IOPAMIDOL (ISOVUE-300) INJECTION 61% COMPARISON:  CT of the abdomen 12/11/2015, ERCP 12/12/2015 FINDINGS: Lower chest: Moderate in severity a calcific coronary artery atherosclerotic disease. Bilateral water density pleural effusions, right greater than left, with associated subsegmental and segmental atelectasis. Hepatobiliary: There is a 11.2 by 5.4 by 13.2 cm subcapsular water density fluid collection along the superior lateral margin of the liver. Percutaneous pigtail catheter is seen entering the abdomen from the right upper quadrant approach and terminating inferior to the liver in the gallbladder fossa. Patient is status post cholecystectomy. Small amount of water density free fluid is seen along the tract of the percutaneous drainage catheter and within the surgical bed. The collection within the surgical bed measures 5.6 x 2.3 cm, decreased from prior measurement of 7.2 x 3.8 cm. A metallic stent is present within the extrahepatic common bile duct terminating in the second portion of the duodenum. Pancreas: Unremarkable. No pancreatic ductal dilatation or surrounding inflammatory changes. Spleen: Normal in size without focal abnormality. . Adrenals/Urinary Tract: Adrenal glands are unremarkable. Kidneys are normal, without renal calculi, focal lesion, or hydronephrosis. Bladder is unremarkable. Stomach/Bowel: Stomach is within normal  limits. Appendix appears normal. No evidence of bowel wall thickening, distention, or inflammatory changes. Vascular/Lymphatic: Aortic atherosclerosis. No enlarged abdominal or pelvic lymph nodes. Reproductive: Status post  hysterectomy. No adnexal masses. Other: Small amount of water density fluid in the pelvis. Musculoskeletal: No acute osseous findings. 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 Superior endplate compression deformity of L1 vertebral body may be degenerative or represent mild compression fracture with chronic appearance. IMPRESSION: Interval placement of percutaneous drainage catheter within the gallbladder fossa with decrease of the previously demonstrated biloma which now measures 5.6 x 2.3 cm (prior measurement of 7.2 x 3.8 cm ). This collection is contiguous with stable in size subcapsular hepatic collection which measures 11.2 x 5.4 x 13.2 cm. Common bile duct metallic stent in satisfactory position. Slightly increased in size bilateral pleural effusions, right greater than left, with right lower lobe segmental atelectasis. Advanced calcific atherosclerotic disease of the coronary arteries and distal abdominal aorta. Electronically Signed   By: Fidela Salisbury M.D.   On: 12/15/2015 16:46       Assessment / Plan:    #1 65 yo female s/p lap chole 9/28 Admitted with bile leak 10/3, s/p ERCP and stent 10/4 Pt with persistent severe pain   , cultures growing strep viridans - now on Rocephin and Flagyl  Pt has had significant increase in bilary drainage - concern for inadequate drainage of fluid collection, and /or persistent bile leak IR to see and reposition drain, possibly place second drain tomorrow  Will discuss repeat HIDA scan - then possible /replace upsizing  of CBD stent   Will need to hold Lovenox if new drain to be  placed  Active Problems:   Sepsis ([place])   Bile leak   Biloma   Biloma following surgery   Status post cholecystectomy   Infection by Streptococcus, viridans group   Routine screening for STI (sexually transmitted infection)     LOS: 5 days   Amy Esterwood  12/16/2015, 11:16 AM

## 2015-12-16 NOTE — Progress Notes (Addendum)
4 Days Post-Op  Subjective: No worse, but only slightly better Still has right flank and right shoulder pain Afebrile.  Alert and stable.  Bile drainage has increased to 1465 mL per 24 hours.  This has doubled  CT scan yesterday shows percutaneous drainage catheter with pigtail in subhepatic space and less fluid in subhepatic space.  There is still a large collection in the lateral sub-capsular hepatic area measuring 13 cm in greatest dimension.   I do not think that this area is being adequately drained by the catheter.  Stent apparently appears in good position  WBC 15,400.  Hemoglobin 9.9.  Potassium 4.1.  Creatinine 0.71.  Bilirubin 0.4.  Transaminases basically normal.  Lipase 47  Objective: Vital signs in last 24 hours: Temp:  [97.9 F (36.6 C)-99.1 F (37.3 C)] 97.9 F (36.6 C) (10/08 0458) Pulse Rate:  [95-103] 102 (10/08 0458) Resp:  [17-18] 17 (10/08 0458) BP: (140-157)/(78-85) 140/78 (10/08 0458) SpO2:  [94 %-96 %] 95 % (10/08 0458) Last BM Date: 12/16/15  Intake/Output from previous day: 10/07 0701 - 10/08 0700 In: 3935 [P.O.:300; I.V.:2725; IV Piggyback:50] Out: 2965 [Urine:1500; Drains:1465] Intake/Output this shift: No intake/output data recorded.   General appearance: Alert.  Pleasant.  Mild distress.  Appropriate. Resp: Lungs sound fairly clear.  No wheeze or rhonchi at right base GI: Soft.  Some tenderness epigastrium and right upper quadrant.  Lower abdomen soft nontender.  Clear bile in drainage tube.  Significant amount.   Lab Results:   Recent Labs  12/15/15 0312 12/16/15 0241  WBC 11.7* 15.4*  HGB 10.3* 9.9*  HCT 31.4* 30.8*  PLT PLATELET CLUMPS NOTED ON SMEAR, COUNT APPEARS ADEQUATE 500*   BMET  Recent Labs  12/15/15 0312 12/16/15 0241  NA 138 138  K 4.8 4.1  CL 107 103  CO2 22 25  GLUCOSE 102* 98  BUN <5* <5*  CREATININE 0.73 0.71  CALCIUM 7.8* 8.2*   PT/INR No results for input(s): LABPROT, INR in the last 72 hours. ABG No  results for input(s): PHART, HCO3 in the last 72 hours.  Invalid input(s): PCO2, PO2  Studies/Results: Ct Abdomen Pelvis W Contrast  Result Date: 12/15/2015 CLINICAL DATA:  Right upper quadrant abdominal pain. Patient is 2 weeks post cholecystectomy with bile leak. EXAM: CT ABDOMEN AND PELVIS WITH CONTRAST TECHNIQUE: Multidetector CT imaging of the abdomen and pelvis was performed using the standard protocol following bolus administration of intravenous contrast. CONTRAST:  150mL ISOVUE-300 IOPAMIDOL (ISOVUE-300) INJECTION 61% COMPARISON:  CT of the abdomen 12/11/2015, ERCP 12/12/2015 FINDINGS: Lower chest: Moderate in severity a calcific coronary artery atherosclerotic disease. Bilateral water density pleural effusions, right greater than left, with associated subsegmental and segmental atelectasis. Hepatobiliary: There is a 11.2 by 5.4 by 13.2 cm subcapsular water density fluid collection along the superior lateral margin of the liver. Percutaneous pigtail catheter is seen entering the abdomen from the right upper quadrant approach and terminating inferior to the liver in the gallbladder fossa. Patient is status post cholecystectomy. Small amount of water density free fluid is seen along the tract of the percutaneous drainage catheter and within the surgical bed. The collection within the surgical bed measures 5.6 x 2.3 cm, decreased from prior measurement of 7.2 x 3.8 cm. A metallic stent is present within the extrahepatic common bile duct terminating in the second portion of the duodenum. Pancreas: Unremarkable. No pancreatic ductal dilatation or surrounding inflammatory changes. Spleen: Normal in size without focal abnormality. . Adrenals/Urinary Tract: Adrenal glands are unremarkable.  Kidneys are normal, without renal calculi, focal lesion, or hydronephrosis. Bladder is unremarkable. Stomach/Bowel: Stomach is within normal limits. Appendix appears normal. No evidence of bowel wall thickening,  distention, or inflammatory changes. Vascular/Lymphatic: Aortic atherosclerosis. No enlarged abdominal or pelvic lymph nodes. Reproductive: Status post hysterectomy. No adnexal masses. Other: Small amount of water density fluid in the pelvis. Musculoskeletal: No acute osseous findings. 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 Superior endplate compression deformity of L1 vertebral body may be degenerative or represent mild compression fracture with chronic appearance. IMPRESSION: Interval placement of percutaneous drainage catheter within the gallbladder fossa with decrease of the previously demonstrated biloma which now measures 5.6 x 2.3 cm (prior measurement of 7.2 x 3.8 cm ). This collection is contiguous with stable in size subcapsular hepatic collection which measures 11.2 x 5.4 x 13.2 cm. Common bile duct metallic stent in satisfactory position. Slightly increased in size bilateral pleural effusions, right greater than left, with right lower lobe segmental atelectasis. Advanced calcific atherosclerotic disease of the coronary arteries and distal abdominal aorta. Electronically Signed   By: Fidela Salisbury M.D.   On: 12/15/2015 16:46    Anti-infectives: Anti-infectives    Start     Dose/Rate Route Frequency Ordered Stop   12/14/15 1445  cefTRIAXone (ROCEPHIN) 2 g in dextrose 5 % 50 mL IVPB     2 g 100 mL/hr over 30 Minutes Intravenous Every 24 hours 12/14/15 1435     12/14/15 1445  metroNIDAZOLE (FLAGYL) tablet 500 mg     500 mg Oral Every 8 hours 12/14/15 1435     12/12/15 0930  vancomycin (VANCOCIN) IVPB 1000 mg/200 mL premix  Status:  Discontinued     1,000 mg 200 mL/hr over 60 Minutes Intravenous Every 12 hours 12/12/15 0913 12/14/15 1435   12/11/15 0800  piperacillin-tazobactam (ZOSYN) IVPB 3.375 g  Status:  Discontinued     3.375 g 12.5 mL/hr over 240 Minutes Intravenous Every 8 hours 12/11/15 0536 12/14/15 1435   12/11/15 0045  piperacillin-tazobactam  (ZOSYN) IVPB 3.375 g     3.375 g 100 mL/hr over 30 Minutes Intravenous  Once 12/11/15 0038 12/11/15 0339      Assessment/Plan: s/p Procedure(s): ENDOSCOPIC RETROGRADE CHOLANGIOPANCREATOGRAPHY (ERCP) BILIARY STENT PLACEMENT   - s/p lap chole with IOC 12/06/15 Dr. Barry Dienes - returned to ED 12/11/15 with postop bile leak-looks like leak from accessory right hepatic duct. - RUQ drain placed 12/11/15 - s/p stent placement 12/12/15, Dr. Loletha Carrow - culture showed gram positive and gram negative rods; Viridans streptococcus; started on vanc 12/12/15 - afebrile, WBC down -Drain output significant.  Shoulder pain.  I do not think that we are adequately controlling her drainage with the percutaneous drain and the pancreatic stent.  Will ask IR to reassess and see if drain can be repositioned or second drain can be placed to drain subphrenic and subcapsular fluid collection -We will ask GI to reassess their stent.  Looks okay by imaging criteria, however.  I wonder if she needs a larger stent to better decompress the CBD. -Discussed with Dr. Earleen Newport in IR.  He will reassess and set her up for drain repositioning or second drain tomorrow.   Hypokalemia - resolved  ID- zosyn 10/3>>, vanc 10/4>> FEN - heart healthy, IVF VTE - SCDs, lovenox (on eliquis preop)  Plan - PO pain control Advance to soft diet   LOS: 5 days    [name] M 12/16/2015

## 2015-12-16 NOTE — Consult Note (Signed)
Chief Complaint: Patient was seen in consultation today for injection of existing biloma drain with possible manipulation vs new  placment Chief Complaint  Patient presents with  . Fever  . Abdominal Pain   at the request of Dr Fanny Skates  Referring Physician(s): Dr Leane Para  Supervising Physician: Corrie Mckusick  Patient Status: Inpatient  History of Present Illness: [name] is a 65 y.o. female   Cholecystectomy with development of Biloma Biloma drain placed in IR 10/3 ERCP and stent 10/4 Output decreased for approx 24 hrs---now great increase 1.4 L in 24 hrs CT 10/7: IMPRESSION: Interval placement of percutaneous drainage catheter within the gallbladder fossa with decrease of the previously demonstrated biloma which now measures 5.6 x 2.3 cm (prior measurement of 7.2 x 3.8 cm ). This collection is contiguous with stable in size subcapsular hepatic collection which measures 11.2 x 5.4 x 13.2 cm. Common bile duct metallic stent in satisfactory position. Slightly increased in size bilateral pleural effusions, right greater than left, with right lower lobe segmental atelectasis. Advanced calcific atherosclerotic disease of the coronary arteries and distal abdominal aorta.  Request for biloma drain manipulation vs new placement per Dr Dalbert Batman Dr Earleen Newport has approved procedure  Past Medical History:  Diagnosis Date  . Chronic kidney disease    stage 3  . GERD (gastroesophageal reflux disease)   . Hyperlipidemia   . Migraine headache   . Occipital neuralgia    Left  . Renal insufficiency   . Stroke [facility]) 10/2013    Past Surgical History:  Procedure Laterality Date  . ABDOMINAL HYSTERECTOMY    . BILIARY STENT PLACEMENT N/A 12/12/2015   Procedure: BILIARY STENT PLACEMENT;  Surgeon: Doran Stabler, MD;  Location: [facility];  Service: Endoscopy;  Laterality: N/A;  . BLADDER REPAIR    . CARPAL TUNNEL RELEASE    . CERVICAL SPINE SURGERY    .  CHOLECYSTECTOMY N/A 12/06/2015   Procedure: LAPAROSCOPIC CHOLECYSTECTOMY WITH  INTRAOPERATIVE CHOLANGIOGRAM;  Surgeon: Stark Klein, MD;  Location: [facility];  Service: General;  Laterality: N/A;  . ELBOW SURGERY    . ERCP N/A 12/12/2015   Procedure: ENDOSCOPIC RETROGRADE CHOLANGIOPANCREATOGRAPHY (ERCP);  Surgeon: Doran Stabler, MD;  Location: [facility] [place] [facility];  Service: Endoscopy;  Laterality: N/A;  . KNEE SURGERY    . LUNG SURGERY      Allergies: Shellfish allergy  Medications: Prior to Admission medications   Medication Sig Start Date End Date Taking? Authorizing Provider  atorvastatin (LIPITOR) 40 MG tablet Take 1 tablet (40 mg total) by mouth daily. 01/04/15  Yes Elberta Leatherwood, MD  clopidogrel (PLAVIX) 75 MG tablet TAKE 1 TABLET BY MOUTH EVERY DAY 09/04/15  Yes Elberta Leatherwood, MD  doxepin (SINEQUAN) 100 MG capsule Take 2 capsules (200 mg total) by mouth at bedtime. 10/15/14  Yes Kathrynn Ducking, MD  furosemide (LASIX) 20 MG tablet Take 10 mg by mouth daily. 05/09/14  Yes Historical Provider, MD  gabapentin (NEURONTIN) 600 MG tablet Take 1 tablet (600 mg total) by mouth 2 (two) times daily. 12/05/15  Yes Charlett Blake, MD  lidocaine (LIDODERM) 5 % Place 2 patches onto the skin daily. Remove & Discard patch within 12 hours or as directed by MD 11/28/15  Yes Charlett Blake, MD  meclizine (ANTIVERT) 25 MG tablet Take 1 tablet (25 mg total) by mouth 2 (two) times daily as needed for dizziness. 07/16/15  Yes Elberta Leatherwood, MD  ondansetron (ZOFRAN-ODT) 4  MG disintegrating tablet Take 1 tablet (4 mg total) by mouth every 6 (six) hours as needed for nausea. 12/08/15  Yes Jerrye Beavers, PA-C  oxyCODONE (OXY IR/ROXICODONE) 5 MG immediate release tablet Take 1-2 tablets (5-10 mg total) by mouth every 4 (four) hours as needed for moderate pain. 12/08/15  Yes Brooke A Miller, PA-C  ramipril (ALTACE) 2.5 MG capsule TAKE ONE CAPSULE BY MOUTH EVERY DAY 10/01/15  Yes Elberta Leatherwood, MD  tiZANidine (ZANAFLEX) 2 MG  tablet Take 1 tablet (2 mg total) by mouth 3 (three) times daily. 09/12/15  Yes Ward Givens, NP  topiramate (TOPAMAX) 100 MG tablet TAKE 1 TABLET TWICE A DAY 05/07/15  Yes Kathrynn Ducking, MD  sertraline (ZOLOFT) 50 MG tablet TAKE 1 TABLET (50 MG TOTAL) BY MOUTH 2 (TWO) TIMES DAILY. 12/13/15   Elberta Leatherwood, MD     Family History  Problem Relation Age of Onset  . Cancer Mother 29    breast  . Alzheimer's disease Mother   . Cancer Brother   . Hyperlipidemia Brother   . Prostate cancer Father   . Lung cancer Father     Social History   Social History  . Marital status: Married    Spouse name: [name]  . Number of children: 1  . Years of education: 12+   Occupational History  . [employer]  . CNA    Social History Main Topics  . Smoking status: Former Smoker    Packs/day: 0.30    Types: Cigarettes    Quit date: 11/01/2011  . Smokeless tobacco: Never Used  . Alcohol use No  . Drug use: No  . Sexual activity: Yes    Partners: Male    Birth control/ protection: Surgical     Comment: 1 sexual partner in last 12 months: married   Other Topics Concern  . None   Social History Narrative   Patient is married ([name]).   Patient has one child.   Patient is working full-time.   Patient is right handed.   Patient has a college education.   Patient does not drink caffeine.    Review of Systems: A 12 point ROS discussed and pertinent positives are indicated in the HPI above.  All other systems are negative.  Review of Systems  Constitutional: Positive for activity change, appetite change and fatigue. Negative for fever.  Respiratory: Negative for shortness of breath.   Cardiovascular: Negative for chest pain.  Gastrointestinal: Positive for abdominal pain, diarrhea, nausea and vomiting.  Neurological: Positive for weakness.  Psychiatric/Behavioral: Negative for behavioral problems and confusion.    Vital Signs: BP 140/78 (BP Location: Right Arm)    Pulse (!) 102   Temp 97.9 F (36.6 C) (Oral)   Resp 17   Ht 5\' 7"  (1.702 m)   Wt 166 lb (75.3 kg)   SpO2 95%   BMI 26.00 kg/m   Physical Exam  Constitutional: She is oriented to person, place, and time.  Cardiovascular: Normal rate.   Pulmonary/Chest: Effort normal.  Abdominal: Bowel sounds are normal. There is tenderness.  Biloma drain intact  Musculoskeletal: Normal range of motion.  Neurological: She is alert and oriented to person, place, and time.  Skin: Skin is warm and dry.  Psychiatric: She has a normal mood and affect. Her behavior is normal. Judgment and thought content normal.  Nursing note and vitals reviewed.   Mallampati Score:  MD Evaluation Airway: WNL Heart: WNL Abdomen: WNL Chest/  Lungs: WNL ASA  Classification: 3 Mallampati/Airway Score: One  Imaging: Dg Chest 2 View  Result Date: 12/10/2015 CLINICAL DATA:  History of gallbladder surgery last Wednesday. Developed fever and cough on Sunday with right-sided abdominal pain radiating up into chest. EXAM: CHEST  2 VIEW COMPARISON:  Prior chest radiographs dating back through 02/07/2012 FINDINGS: Low lung volumes since previous studies. Trace fluid in the minor fissure on the right with elevated appearance of the right hemidiaphragm which may in fact be due to a moderate-sized subpulmonic effusion. Slight blunting left lateral costophrenic angle as well presumably from a effusion. The heart and mediastinal contours are stable and within normal limits for size. Mild atherosclerosis of the aortic arch. The patient is status post ACDF of the lower cervical spine. Chronic L1 mild compression. IMPRESSION: Elevated appearance of the right hemidiaphragm which may in fact be due to a moderate subpulmonic pleural effusion as there is also some fluid in the right minor fissure and left costophrenic angle on current exam. Decubitus views (right-side-down) of the chest may be provide more information as to whether this represents  pleural fluid or CT as deemed clinically necessary. Electronically Signed   By: Ashley Royalty M.D.   On: 12/10/2015 19:45   Dg Chest 2 View  Result Date: 12/04/2015 CLINICAL DATA:  Chest pain in shortness of breath today. EXAM: CHEST  2 VIEW COMPARISON:  10/10/2013 chest radiograph. 06/21/2013 CT abdomen and pelvis. FINDINGS: The cardiomediastinal silhouette is within normal limits. The lungs are well inflated and clear. There is no evidence of pleural effusion or pneumothorax. Abdominal aortic atherosclerosis is noted. There is a mild chronic L1 compression fracture. IMPRESSION: 1. No active cardiopulmonary disease. 2. Aortic atherosclerosis. Electronically Signed   By: Logan Bores M.D.   On: 12/04/2015 16:25   Dg Cholangiogram Operative  Result Date: 12/06/2015 CLINICAL DATA:  Gallstones EXAM: INTRAOPERATIVE CHOLANGIOGRAM TECHNIQUE: Cholangiographic images from the C-arm fluoroscopic device were submitted for interpretation post-operatively. Please see the procedural report for the amount of contrast and the fluoroscopy time utilized. COMPARISON:  None. FINDINGS: Contrast fills the biliary tree and duodenum without filling defects in the common bile duct. There is some narrowing of the distal common bile duct of unknown significance. IMPRESSION: Patent biliary tree. Electronically Signed   By: Marybelle Killings M.D.   On: 12/06/2015 13:12   Nm Hepatobiliary Liver Func  Result Date: 12/11/2015 CLINICAL DATA:  65 year old female with a history of cholecystectomy and biloma formation. EXAM: NUCLEAR MEDICINE HEPATOBILIARY IMAGING TECHNIQUE: Sequential images of the abdomen were obtained out to 60 minutes following intravenous administration of radiopharmaceutical. RADIOPHARMACEUTICALS:  5.0 mCi Tc-31m  Choletec IV COMPARISON:  None. FINDINGS: Prompt, uniform hepatic uptake with biliary excretion. Activity within the biliary duct evident at approximately 15 minutes. No focal concentration of radiotracer at the  inferior liver margin. IMPRESSION: No focal concentration of radiotracer at the inferior liver margin to confirm biliary leak on the current study. Signed, Dulcy Fanny. Earleen Newport, DO [facility] Radiology Electronically Signed   By: Corrie Mckusick D.O.   On: 12/11/2015 16:16   Ct Abdomen Pelvis W Contrast  Result Date: 12/15/2015 CLINICAL DATA:  Right upper quadrant abdominal pain. Patient is 2 weeks post cholecystectomy with bile leak. EXAM: CT ABDOMEN AND PELVIS WITH CONTRAST TECHNIQUE: Multidetector CT imaging of the abdomen and pelvis was performed using the standard protocol following bolus administration of intravenous contrast. CONTRAST:  114mL ISOVUE-300 IOPAMIDOL (ISOVUE-300) INJECTION 61% COMPARISON:  CT of the  abdomen 12/11/2015, ERCP 12/12/2015 FINDINGS: Lower chest: Moderate in severity a calcific coronary artery atherosclerotic disease. Bilateral water density pleural effusions, right greater than left, with associated subsegmental and segmental atelectasis. Hepatobiliary: There is a 11.2 by 5.4 by 13.2 cm subcapsular water density fluid collection along the superior lateral margin of the liver. Percutaneous pigtail catheter is seen entering the abdomen from the right upper quadrant approach and terminating inferior to the liver in the gallbladder fossa. Patient is status post cholecystectomy. Small amount of water density free fluid is seen along the tract of the percutaneous drainage catheter and within the surgical bed. The collection within the surgical bed measures 5.6 x 2.3 cm, decreased from prior measurement of 7.2 x 3.8 cm. A metallic stent is present within the extrahepatic common bile duct terminating in the second portion of the duodenum. Pancreas: Unremarkable. No pancreatic ductal dilatation or surrounding inflammatory changes. Spleen: Normal in size without focal abnormality. . Adrenals/Urinary Tract: Adrenal glands are unremarkable. Kidneys  are normal, without renal calculi, focal lesion, or hydronephrosis. Bladder is unremarkable. Stomach/Bowel: Stomach is within normal limits. Appendix appears normal. No evidence of bowel wall thickening, distention, or inflammatory changes. Vascular/Lymphatic: Aortic atherosclerosis. No enlarged abdominal or pelvic lymph nodes. Reproductive: Status post hysterectomy. No adnexal masses. Other: Small amount of water density fluid in the pelvis. Musculoskeletal: No acute osseous findings. 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 Superior endplate compression deformity of L1 vertebral body may be degenerative or represent mild compression fracture with chronic appearance. IMPRESSION: Interval placement of percutaneous drainage catheter within the gallbladder fossa with decrease of the previously demonstrated biloma which now measures 5.6 x 2.3 cm (prior measurement of 7.2 x 3.8 cm ). This collection is contiguous with stable in size subcapsular hepatic collection which measures 11.2 x 5.4 x 13.2 cm. Common bile duct metallic stent in satisfactory position. Slightly increased in size bilateral pleural effusions, right greater than left, with right lower lobe segmental atelectasis. Advanced calcific atherosclerotic disease of the coronary arteries and distal abdominal aorta. Electronically Signed   By: Fidela Salisbury M.D.   On: 12/15/2015 16:46   Ct Abdomen Pelvis W Contrast  Result Date: 12/11/2015 CLINICAL DATA:  Status post cholecystectomy 5 days ago, now with fever, intermittent confusion. Constipation. EXAM: CT ABDOMEN AND PELVIS WITH CONTRAST TECHNIQUE: Multidetector CT imaging of the abdomen and pelvis was performed using the standard protocol following bolus administration of intravenous contrast. CONTRAST:  79mL ISOVUE-300 IOPAMIDOL (ISOVUE-300) INJECTION 61% COMPARISON:  CT abdomen and pelvis June 21, 2013 FINDINGS: LOWER CHEST: Partially imaged small to moderate pleural  effusion. Enhancing RIGHT middle lobe and RIGHT lower lobe atelectasis. Trace LEFT pleural effusion and atelectasis. Heart size is normal. Mild coronary artery calcifications . HEPATOBILIARY: Status post cholecystectomy. 7.2 x 3.8 cm fluid collection within the gallbladder fossa, this is contiguous with 5.8 x 11.6 x 15.7 cm (volume = 550 cm^3) hepatic subcapsular fluid collection. Small amount of fluid extends to Morrison's pouch. Mass effect on the RIGHT lobe of the liver, trace intrahepatic biliary dilatation. No choledocholithiasis. PANCREAS: Normal. SPLEEN: Normal. ADRENALS/URINARY TRACT: Kidneys are orthotopic, demonstrating symmetric enhancement. No nephrolithiasis, hydronephrosis or solid renal masses. The unopacified ureters are normal in course and caliber. Delayed imaging through the kidneys demonstrates symmetric prompt contrast excretion within the proximal urinary collecting system. Urinary bladder is partially distended and unremarkable. Normal adrenal glands. STOMACH/BOWEL: The stomach, small and large bowel are normal in course and caliber without inflammatory changes. Mild amount of retained large  bowel stool . Five pill like densities in the cecum. Normal appendix. VASCULAR/LYMPHATIC: Aortoiliac vessels are normal in course and caliber, severe calcific atherosclerosis. Normal appendix. No lymphadenopathy by CT size criteria. REPRODUCTIVE: Status post hysterectomy. OTHER: Small amount of low-density free fluid in the pelvis. No intraperitoneal free air. MUSCULOSKELETAL: Nonacute. Mild chronic L1 compression fracture. Severe L5-S1 degenerative disc and severe L5-S1 neural foraminal narrowing. IMPRESSION: Status post recent cholecystectomy with fluid collection within the gallbladder fossa contiguous with hepatic subcapsular 5.8 x 11.6 x 15.7 cm fluid collection compatible with biloma. Superimposed infection possible. Small amount of free fluid in the pelvis. Small to moderate RIGHT pleural effusion,  trace on the LEFT with compressive atelectasis. Severe atherosclerosis. Acute findings discussed with and reconfirmed by [facility] NANAVATI on 12/11/2015 at 4:30 am. Electronically Signed   By: Elon Alas M.D.   On: 12/11/2015 04:33   Dg Ercp Biliary & Pancreatic Ducts  Result Date: 12/12/2015 CLINICAL DATA:  65 year old female with a history of prior cholecystectomy and biloma. Concern for biliary leak EXAM: ERCP TECHNIQUE: Multiple spot images obtained with the fluoroscopic device and submitted for interpretation post-procedure. FLUOROSCOPY TIME:  2 minutes 57 seconds COMPARISON:  Nuclear medicine study 12/11/2015, CT 12/11/2015 FINDINGS: Limited intraoperative fluoroscopic spot images during ERCP. Initial image demonstrates endoscope projecting over the upper abdomen with cannulation of the ampulla and retrograde infusion of contrast. Pigtail drainage catheter at the inferior liver margin is noted adjacent to surgical clips. During the course of the image, there is infusion of contrast an extraluminal location at the inferior liver margin adjacent to the pigtail drainage catheter. Final image demonstrates placement of a stent. IMPRESSION: ERCP demonstrates evidence of biliary leak at the inferior liver margin, with subsequent placement of biliary stent. Please refer to the dictated operative report for full details of intraoperative findings and procedure. Signed, Dulcy Fanny. Earleen Newport, DO [facility] Radiology Electronically Signed   By: Corrie Mckusick D.O.   On: 12/12/2015 14:32   [place] Image Guided Drainage By Percutaneous Catheter  Result Date: 12/11/2015 INDICATION: 65 year old female with fluid collection after cholecystectomy. EXAM: [place] IMAGE GUIDED DRAINAGE BY PERCUTANEOUS CATHETE MEDICATIONS: The patient is currently admitted to the hospital and receiving intravenous antibiotics. The antibiotics were administered within an appropriate time frame prior to  the initiation of the procedure. ANESTHESIA/SEDATION: Fentanyl 50 mcg IV; Versed 1.0 mg IV Moderate Sedation Time:  15 minutes The patient was continuously monitored during the procedure by the interventional radiology nurse under my direct supervision. COMPLICATIONS: None PROCEDURE: Informed written consent was obtained from the patient after a thorough discussion of the procedural risks, benefits and alternatives. All questions were addressed. Maximal Sterile Barrier Technique was utilized including caps, mask, sterile gowns, sterile gloves, sterile drape, hand hygiene and skin antiseptic. A timeout was performed prior to the initiation of the procedure. Patient positioned supine position on the ultrasound table. Scout images of the upper abdomen were performed for planning purposes. Patient was then prepped and draped in the usual sterile fashion. The skin and subcutaneous tissues were generously infiltrated 1% lidocaine for local anesthesia. Small stab incision was made with 11 blade scalpel. Using ultrasound guidance, trocar technique was used to advance a 10 [place] drain into the subhepatic fluid collection. Once the drain was in place and confirmed in position, spontaneous ileus fluid returned. Approximately 290 cc of ileus fluid was aspirated. Sample was aspirated for the lab. Catheter was sutured in position and a final image was stored. Catheter attached  to gravity drainage. Patient tolerated the procedure well and remained hemodynamically stable throughout. No complications were encountered and no significant blood loss encountered. IMPRESSION: Status post ultrasound-guided drainage of subhepatic fluid collection with approximately 290 cc of ileus fluid removed. Sample was sent the lab for culture. Signed, Dulcy Fanny. Earleen Newport, DO [facility] Radiology Electronically Signed   By: Corrie Mckusick D.O.   On: 12/11/2015 15:03   US Abdomen Limited Ruq  Result  Date: 12/04/2015 CLINICAL DATA:  Acute onset of right upper quadrant abdominal pain. Initial encounter. EXAM: US ABDOMEN LIMITED - RIGHT UPPER QUADRANT COMPARISON:  CT of the abdomen and pelvis performed 06/21/2013 FINDINGS: Gallbladder: A 1.3 cm stone is noted about the base of the gallbladder, mobile but remaining near the neck. No gallbladder wall thickening or pericholecystic fluid is seen. No ultrasonographic Murphy's sign is elicited. Common bile duct: Diameter: 0.7 cm, within normal limits in caliber for the patient's age. Liver: No focal lesion identified. Within normal limits in parenchymal echogenicity. IMPRESSION: 1.3 cm stone noted about the base of the gallbladder, mobile but remaining near the neck. No definite evidence for obstruction or cholecystitis. Electronically Signed   By: Garald Balding M.D.   On: 12/04/2015 21:15    Labs:  CBC:  Recent Labs  12/13/15 0444 12/14/15 0453 12/15/15 0312 12/16/15 0241  WBC 11.4* 16.2* 11.7* 15.4*  HGB 10.3* 11.3* 10.3* 9.9*  HCT 31.4* 34.6* 31.4* 30.8*  PLT 373 578* PLATELET CLUMPS NOTED ON SMEAR, COUNT APPEARS ADEQUATE 500*    COAGS:  Recent Labs  12/05/15 0406 12/11/15 0729  INR 1.03 1.10  APTT 33  --     BMP:  Recent Labs  12/13/15 0444 12/14/15 0453 12/15/15 0312 12/16/15 0241  NA 140 137 138 138  K 2.7* 3.1* 4.8 4.1  CL 105 105 107 103  CO2 23 20* 22 25  GLUCOSE 111* 108* 102* 98  BUN 9 <5* <5* <5*  CALCIUM 7.6* 8.0* 7.8* 8.2*  CREATININE 0.77 0.87 0.73 0.71  GFRNONAA >60 >60 >60 >60  GFRAA >60 >60 >60 >60    LIVER FUNCTION TESTS:  Recent Labs  12/12/15 0241 12/13/15 0444 12/14/15 0453 12/16/15 0241  BILITOT 0.8 0.8 0.6 0.4  AST 27 30 43* 45*  ALT 28 26 31  36  ALKPHOS 125 125 148* 132*  PROT 5.0* 5.3* 6.3* 5.3*  ALBUMIN 2.0* 1.9* 2.3* 1.8*    TUMOR MARKERS: No results for input(s): AFPTM, CEA, CA199, CHROMGRNA in the last 8760 hours.  Assessment and Plan:  Cholecystectomy with biloma Drain  placed 10/3 ERCP/stent 10/4 Increased output---new collection per CT Now for drain manipulation and possible new drain placement Risks and Benefits discussed with the patient including bleeding, infection, damage to adjacent structures, bowel perforation/fistula connection, and sepsis. All of the patient's questions were answered, patient is agreeable to proceed. Consent signed and in chart.   Thank you for this interesting consult.  I greatly enjoyed meeting QUINESHA THEIN and look forward to participating in their care.  A copy of this report was sent to the requesting provider on this date.  Electronically Signed: Monia Sabal A 12/16/2015, 11:22 AM   I spent a total of 40 Minutes    in face to face in clinical consultation, greater than 50% of which was counseling/coordinating care for biloma drain manipulation--poss new placement

## 2015-12-17 ENCOUNTER — Ambulatory Visit: Payer: Self-pay | Admitting: Family Medicine

## 2015-12-17 ENCOUNTER — Inpatient Hospital Stay (HOSPITAL_COMMUNITY): Payer: BLUE CROSS/BLUE SHIELD

## 2015-12-17 ENCOUNTER — Encounter (HOSPITAL_COMMUNITY): Payer: Self-pay | Admitting: Cardiovascular Disease

## 2015-12-17 ENCOUNTER — Encounter (HOSPITAL_COMMUNITY): Admission: EM | Disposition: A | Discharge status: Home Health | Payer: Self-pay | Source: Home / Self Care

## 2015-12-17 DIAGNOSIS — K9189 Other postprocedural complications and disorders of digestive system: Secondary | ICD-10-CM

## 2015-12-17 DIAGNOSIS — R509 Fever, unspecified: Secondary | ICD-10-CM

## 2015-12-17 DIAGNOSIS — R112 Nausea with vomiting, unspecified: Secondary | ICD-10-CM

## 2015-12-17 DIAGNOSIS — R7881 Bacteremia: Secondary | ICD-10-CM

## 2015-12-17 HISTORY — PX: IR GENERIC HISTORICAL: IMG1180011

## 2015-12-17 LAB — CULTURE, BLOOD (ROUTINE X 2)

## 2015-12-17 LAB — ECHOCARDIOGRAM COMPLETE
Height: 67 in
Weight: 2656 oz

## 2015-12-17 LAB — PROTIME-INR
INR: 1.42
Prothrombin Time: 17.5 seconds — ABNORMAL HIGH (ref 11.4–15.2)

## 2015-12-17 SURGERY — CANCELLED PROCEDURE

## 2015-12-17 MED ORDER — METOCLOPRAMIDE HCL 5 MG/ML IJ SOLN
5.0000 mg | Freq: Three times a day (TID) | INTRAMUSCULAR | Status: DC
  Administered 2015-12-17: 5 mg via INTRAVENOUS
  Filled 2015-12-17: qty 2

## 2015-12-17 MED ORDER — METOCLOPRAMIDE HCL 5 MG/ML IJ SOLN: 5.0000 mg | Freq: Three times a day (TID) | INTRAMUSCULAR | Status: DC

## 2015-12-17 MED ORDER — FENTANYL CITRATE (PF) 100 MCG/2ML IJ SOLN
INTRAMUSCULAR | Status: AC
  Filled 2015-12-17: qty 2

## 2015-12-17 MED ORDER — FENTANYL CITRATE (PF) 100 MCG/2ML IJ SOLN
INTRAMUSCULAR | Status: AC | PRN
  Administered 2015-12-17 (×2): 50 ug via INTRAVENOUS

## 2015-12-17 MED ORDER — LIDOCAINE HCL 1 % IJ SOLN
INTRAMUSCULAR | Status: AC
  Filled 2015-12-17: qty 20

## 2015-12-17 MED ORDER — PROMETHAZINE HCL 25 MG/ML IJ SOLN: 12.5000 mg | Freq: Four times a day (QID) | INTRAMUSCULAR | Status: DC | PRN

## 2015-12-17 MED ORDER — MIDAZOLAM HCL 2 MG/2ML IJ SOLN
INTRAMUSCULAR | Status: AC | PRN
  Administered 2015-12-17 (×2): 1 mg via INTRAVENOUS

## 2015-12-17 MED ORDER — MIDAZOLAM HCL 2 MG/2ML IJ SOLN
INTRAMUSCULAR | Status: AC
  Filled 2015-12-17: qty 4

## 2015-12-17 MED ORDER — METHOCARBAMOL 500 MG PO TABS
500.0000 mg | ORAL_TABLET | Freq: Four times a day (QID) | ORAL | Status: DC | PRN
  Administered 2015-12-17 – 2015-12-27 (×4): 500 mg via ORAL
  Filled 2015-12-17 (×4): qty 1

## 2015-12-17 MED ORDER — IOPAMIDOL (ISOVUE-300) INJECTION 61%
INTRAVENOUS | Status: AC | PRN
  Administered 2015-12-17: 50 mL
  Administered 2015-12-17: 15 mL

## 2015-12-17 MED ORDER — LIDOCAINE HCL 1 % IJ SOLN
INTRAMUSCULAR | Status: AC | PRN
  Administered 2015-12-17: 16 mL

## 2015-12-17 MED ORDER — NALOXONE HCL 0.4 MG/ML IJ SOLN
INTRAMUSCULAR | Status: AC
  Filled 2015-12-17: qty 1

## 2015-12-17 MED ORDER — HYDROCODONE-ACETAMINOPHEN 5-325 MG PO TABS
1.0000 | ORAL_TABLET | ORAL | Status: DC | PRN
  Administered 2015-12-18 – 2015-12-27 (×9): 2 via ORAL
  Filled 2015-12-17 (×11): qty 2

## 2015-12-17 MED ORDER — PROMETHAZINE HCL 25 MG/ML IJ SOLN
6.2500 mg | INTRAMUSCULAR | Status: DC | PRN
  Administered 2015-12-17 – 2015-12-24 (×11): 6.25 mg via INTRAVENOUS
  Filled 2015-12-17 (×12): qty 1

## 2015-12-17 MED ORDER — FLUMAZENIL 0.5 MG/5ML IV SOLN
INTRAVENOUS | Status: AC
  Filled 2015-12-17: qty 5

## 2015-12-17 MED ORDER — IOPAMIDOL (ISOVUE-300) INJECTION 61%
INTRAVENOUS | Status: AC
  Filled 2015-12-17: qty 50

## 2015-12-17 MED ORDER — HYDROMORPHONE HCL 1 MG/ML IJ SOLN
0.5000 mg | INTRAMUSCULAR | Status: DC | PRN
  Administered 2015-12-17 – 2015-12-25 (×20): 0.5 mg via INTRAVENOUS
  Filled 2015-12-17 (×22): qty 1

## 2015-12-17 NOTE — Progress Notes (Signed)
TEE cancelled per Dr. Oval Linsey due to patient having continued nausea/vomiting.  Pt aware.  Bedside RN Marissa notified.    Vista Lawman, RN

## 2015-12-17 NOTE — Progress Notes (Signed)
MEDICATION RELATED CONSULT NOTE - INITIAL   Pharmacy Consult for post IR procedure consult Indication: review chart for anticoagulation needs  Allergies  Allergen Reactions  . Shellfish Allergy Anaphylaxis and Other (See Comments)    All seafood    Patient Measurements: Height: 5\' 7"  (170.2 cm) Weight: 166 lb (75.3 kg) IBW/kg (Calculated) : 61.6   Vital Signs: Temp: 98.7 F (37.1 C) (10/09 1316) Temp Source: Oral (10/09 1316) BP: 143/79 (10/09 1316) Pulse Rate: 99 (10/09 1316) Intake/Output from previous day: 10/08 0701 - 10/09 0700 In: 1370 [P.O.:120; I.V.:1195; IV Piggyback:50] Out: 705 [Drains:705] Intake/Output from this shift: Total I/O In: 120 [P.O.:120] Out: 200 [Drains:200]  Labs:  Recent Labs  12/15/15 0312 12/16/15 0241  WBC 11.7* 15.4*  HGB 10.3* 9.9*  HCT 31.4* 30.8*  PLT PLATELET CLUMPS NOTED ON SMEAR, COUNT APPEARS ADEQUATE 500*  CREATININE 0.73 0.71  ALBUMIN  --  1.8*  PROT  --  5.3*  AST  --  45*  ALT  --  36  ALKPHOS  --  132*  BILITOT  --  0.4   Estimated Creatinine Clearance: 75.3 mL/min (by C-G formula based on SCr of 0.71 mg/dL).   Microbiology: Recent Results (from the past 720 hour(s))  Surgical pcr screen     Status: None   Collection Time: 12/05/15  3:35 AM  Result Value Ref Range Status   MRSA, PCR NEGATIVE NEGATIVE Final   Staphylococcus aureus NEGATIVE NEGATIVE Final    Comment:        The Xpert SA Assay (FDA approved for NASAL specimens in patients over 27 years of age), is one component of a comprehensive surveillance program.  Test performance has been validated by [facility] for patients greater than or equal to 62 year old. It is not intended to diagnose infection nor to guide or monitor treatment.   Culture, blood (Routine x 2)     Status: Abnormal   Collection Time: 12/10/15  7:00 PM  Result Value Ref Range Status   Specimen Description BLOOD LEFT HAND  Final   Special Requests BOTTLES DRAWN AEROBIC ONLY  5CC  Final   Culture  Setup Time   Final    GRAM POSITIVE COCCOBACILLUS AEROBIC BOTTLE ONLY Organism ID to follow CRITICAL RESULT CALLED TO, READ BACK BY AND VERIFIED WITH: J. Frens Pharm.D. 13:00 12/11/15 ([email or web address])    Culture VIRIDANS STREPTOCOCCUS (A)  Final   Report Status 12/17/2015 FINAL  Final   Organism ID, Bacteria VIRIDANS STREPTOCOCCUS  Final      Susceptibility   Viridans streptococcus - MIC*    PENICILLIN 1 INTERMEDIATE Intermediate     CEFTRIAXONE 0.25 SENSITIVE Sensitive     ERYTHROMYCIN >=8 RESISTANT Resistant     LEVOFLOXACIN 2 SENSITIVE Sensitive     VANCOMYCIN 1 SENSITIVE Sensitive     * VIRIDANS STREPTOCOCCUS  Urine culture     Status: None   Collection Time: 12/10/15  7:00 PM  Result Value Ref Range Status   Specimen Description Urine  Final   Special Requests NONE  Final   Culture NO GROWTH  Final   Report Status 12/12/2015 FINAL  Final  Blood Culture ID Panel (Reflexed)     Status: Abnormal   Collection Time: 12/10/15  7:00 PM  Result Value Ref Range Status   Enterococcus species NOT DETECTED NOT DETECTED Final   Listeria monocytogenes NOT DETECTED NOT DETECTED Final   Staphylococcus species NOT DETECTED NOT DETECTED Final   Staphylococcus aureus NOT DETECTED NOT  DETECTED Final   Streptococcus species DETECTED (A) NOT DETECTED Final    Comment: CRITICAL RESULT CALLED TO, READ BACK BY AND VERIFIED WITH: J. Frens Pharm.D. 13:00 12/11/15 ([email or web address])    Streptococcus agalactiae NOT DETECTED NOT DETECTED Final   Streptococcus pneumoniae NOT DETECTED NOT DETECTED Final   Streptococcus pyogenes NOT DETECTED NOT DETECTED Final   Acinetobacter baumannii NOT DETECTED NOT DETECTED Final   Enterobacteriaceae species NOT DETECTED NOT DETECTED Final   Enterobacter cloacae complex NOT DETECTED NOT DETECTED Final   Escherichia coli NOT DETECTED NOT DETECTED Final   Klebsiella oxytoca NOT DETECTED NOT DETECTED Final   Klebsiella pneumoniae NOT DETECTED NOT DETECTED Final    Proteus species NOT DETECTED NOT DETECTED Final   Serratia marcescens NOT DETECTED NOT DETECTED Final   Haemophilus influenzae NOT DETECTED NOT DETECTED Final   Neisseria meningitidis NOT DETECTED NOT DETECTED Final   Pseudomonas aeruginosa NOT DETECTED NOT DETECTED Final   Candida albicans NOT DETECTED NOT DETECTED Final   Candida glabrata NOT DETECTED NOT DETECTED Final   Candida krusei NOT DETECTED NOT DETECTED Final   Candida parapsilosis NOT DETECTED NOT DETECTED Final   Candida tropicalis NOT DETECTED NOT DETECTED Final  Culture, blood (Routine x 2)     Status: Abnormal   Collection Time: 12/10/15  7:13 PM  Result Value Ref Range Status   Specimen Description BLOOD RIGHT ANTECUBITAL  Final   Special Requests BOTTLES DRAWN AEROBIC ONLY 5CC  Final   Culture  Setup Time   Final    GRAM POSITIVE COCCOBACILLUS AEROBIC BOTTLE ONLY CRITICAL RESULT CALLED TO, READ BACK BY AND VERIFIED WITH: J. Frens Pharm.D. 13:00 12/11/15 ([email or web address])    Culture (A)  Final    VIRIDANS STREPTOCOCCUS SUSCEPTIBILITIES PERFORMED ON PREVIOUS CULTURE WITHIN THE LAST 5 DAYS.    Report Status 12/16/2015 FINAL  Final  MRSA PCR Screening     Status: None   Collection Time: 12/11/15  6:27 AM  Result Value Ref Range Status   MRSA by PCR NEGATIVE NEGATIVE Final    Comment:        The GeneXpert MRSA Assay (FDA approved for NASAL specimens only), is one component of a comprehensive MRSA colonization surveillance program. It is not intended to diagnose MRSA infection nor to guide or monitor treatment for MRSA infections.   Aerobic/Anaerobic Culture (surgical/deep wound)     Status: None   Collection Time: 12/11/15  1:12 PM  Result Value Ref Range Status   Specimen Description ABDOMEN  Final   Special Requests NONE  Final   Gram Stain   Final    NO WBC SEEN FEW GRAM POSITIVE COCCI IN PAIRS AND CHAINS RARE GRAM NEGATIVE RODS    Culture   Final    MODERATE STREPTOCOCCUS SALIVARIUS NO ANAEROBES  ISOLATED    Report Status 12/16/2015 FINAL  Final   Organism ID, Bacteria STREPTOCOCCUS SALIVARIUS  Final      Susceptibility   Streptococcus salivarius - MIC*    PENICILLIN 1 INTERMEDIATE Intermediate     CEFTRIAXONE 0.25 SENSITIVE Sensitive     ERYTHROMYCIN >=8 RESISTANT Resistant     LEVOFLOXACIN 2 SENSITIVE Sensitive     VANCOMYCIN 1 SENSITIVE Sensitive     * MODERATE STREPTOCOCCUS SALIVARIUS  Culture, blood (Routine X 2) w Reflex to ID Panel     Status: None (Preliminary result)   Collection Time: 12/14/15  3:29 PM  Result Value Ref Range Status   Specimen Description BLOOD RIGHT HAND  Final   Special  Requests BOTTLES DRAWN AEROBIC ONLY 5CC  Final   Culture NO GROWTH 3 DAYS  Final   Report Status PENDING  Incomplete  Culture, blood (Routine X 2) w Reflex to ID Panel     Status: None (Preliminary result)   Collection Time: 12/14/15  3:33 PM  Result Value Ref Range Status   Specimen Description BLOOD LEFT HAND  Final   Special Requests IN PEDIATRIC BOTTLE 1.5CC  Final   Culture NO GROWTH 3 DAYS  Final   Report Status PENDING  Incomplete    Medical History: Past Medical History:  Diagnosis Date  . Chronic kidney disease    stage 3  . GERD (gastroesophageal reflux disease)   . Hyperlipidemia   . Migraine headache   . Occipital neuralgia    Left  . Renal insufficiency   . Stroke [facility]) 10/2013    Assessment: 65 yo F s/p IR placement of new biloma drain. She was on LMWH 40 sq VTE px prior to procedure with last dsoe 10/7 at 1300.  Today her INR is up to 1.42 from 1.1 on 10/3 (no coumadin).    Plan:  D/w Dr Vernard Gambles Resume LMWH 40 mq sq q24 tomorrow at noon   Eudelia Bunch, Pharm.D. QP:[phone] 12/17/2015 2:17 PM

## 2015-12-17 NOTE — Progress Notes (Signed)
Subjective:  Increased nausea vomiting today   Antibiotics:  Anti-infectives    Start     Dose/Rate Route Frequency Ordered Stop   12/14/15 1445  cefTRIAXone (ROCEPHIN) 2 g in dextrose 5 % 50 mL IVPB     2 g 100 mL/hr over 30 Minutes Intravenous Every 24 hours 12/14/15 1435     12/14/15 1445  metroNIDAZOLE (FLAGYL) tablet 500 mg     500 mg Oral Every 8 hours 12/14/15 1435     12/12/15 0930  vancomycin (VANCOCIN) IVPB 1000 mg/200 mL premix  Status:  Discontinued     1,000 mg 200 mL/hr over 60 Minutes Intravenous Every 12 hours 12/12/15 0913 12/14/15 1435   12/11/15 0800  piperacillin-tazobactam (ZOSYN) IVPB 3.375 g  Status:  Discontinued     3.375 g 12.5 mL/hr over 240 Minutes Intravenous Every 8 hours 12/11/15 0536 12/14/15 1435   12/11/15 0045  piperacillin-tazobactam (ZOSYN) IVPB 3.375 g     3.375 g 100 mL/hr over 30 Minutes Intravenous  Once 12/11/15 0038 12/11/15 0339      Medications: Scheduled Meds: . cefTRIAXone (ROCEPHIN)  IV  2 g Intravenous Q24H  . docusate sodium  100 mg Oral BID  . [START ON 12/18/2015] enoxaparin (LOVENOX) injection  40 mg Subcutaneous Q24H  . famotidine  20 mg Oral BID  . iopamidol      . lidocaine      . metroNIDAZOLE  500 mg Oral Q8H  . senna  1 tablet Oral BID   Continuous Infusions: . lactated ringers with KCl 20 mEq/L 50 mL/hr at 12/17/15 0309   PRN Meds:.acetaminophen **OR** acetaminophen, bisacodyl, HYDROmorphone (DILAUDID) injection, ondansetron **OR** ondansetron (ZOFRAN) IV, oxyCODONE, promethazine, technetium TC 77M mebrofenin    Objective: Weight change:   Intake/Output Summary (Last 24 hours) at 12/17/15 1242 Last data filed at 12/17/15 0643  Gross per 24 hour  Intake             1370 ml  Output              705 ml  Net              665 ml   Blood pressure (!) 168/92, pulse 96, temperature 98.4 F (36.9 C), temperature source Oral, resp. rate (!) 29, height 5\' 7"  (1.702 m), weight 166 lb (75.3 kg), SpO2 95  %. Temp:  [97.5 F (36.4 C)-99.5 F (37.5 C)] 98.4 F (36.9 C) (10/09 0725) Pulse Rate:  [96-110] 96 (10/09 0725) Resp:  [19-29] 29 (10/09 0725) BP: (145-168)/(85-92) 168/92 (10/09 0725) SpO2:  [95 %-96 %] 95 % (10/09 0725) Weight:  [166 lb (75.3 kg)] 166 lb (75.3 kg) (10/09 0725)  Physical Exam: General: Alert and awake, oriented x3, dysphoric HEENT: anicteric sclera,  EOMI, oropharynx clear and without exudate Cardiovascular: tachy rate, normal r,  no murmur rubs or gallops Pulmonary: clear to auscultation bilaterally, no wheezing, rales or rhonchi Gastrointestinal: soft ttp RUQ, +bs, drain with increased bilious  material present Skin, soft tissue: no rashes Neuro: nonfocal, strength and sensation intact   CBC:  CBC Latest Ref Rng & Units 12/16/2015 12/15/2015 12/14/2015  WBC 4.0 - 10.5 K/uL 15.4(H) 11.7(H) 16.2(H)  Hemoglobin 12.0 - 15.0 g/dL 9.9(L) 10.3(L) 11.3(L)  Hematocrit 36.0 - 46.0 % 30.8(L) 31.4(L) 34.6(L)  Platelets 150 - 400 K/uL 500(H) PLATELET CLUMPS NOTED ON SMEAR, COUNT APPEARS ADEQUATE 578(H)      BMET  Recent Labs  12/15/15 0312 12/16/15 0241  NA 138 138  K 4.8 4.1  CL 107 103  CO2 22 25  GLUCOSE 102* 98  BUN <5* <5*  CREATININE 0.73 0.71  CALCIUM 7.8* 8.2*     Liver Panel   Recent Labs  12/16/15 0241  PROT 5.3*  ALBUMIN 1.8*  AST 45*  ALT 36  ALKPHOS 132*  BILITOT 0.4       Sedimentation Rate No results for input(s): ESRSEDRATE in the last 72 hours. C-Reactive Protein No results for input(s): CRP in the last 72 hours.  Micro Results: Recent Results (from the past 720 hour(s))  Surgical pcr screen     Status: None   Collection Time: 12/05/15  3:35 AM  Result Value Ref Range Status   MRSA, PCR NEGATIVE NEGATIVE Final   Staphylococcus aureus NEGATIVE NEGATIVE Final    Comment:        The Xpert SA Assay (FDA approved for NASAL specimens in patients over 80 years of age), is one component of a comprehensive  surveillance program.  Test performance has been validated by [facility] for patients greater than or equal to 35 year old. It is not intended to diagnose infection nor to guide or monitor treatment.   Culture, blood (Routine x 2)     Status: Abnormal   Collection Time: 12/10/15  7:00 PM  Result Value Ref Range Status   Specimen Description BLOOD LEFT HAND  Final   Special Requests BOTTLES DRAWN AEROBIC ONLY 5CC  Final   Culture  Setup Time   Final    GRAM POSITIVE COCCOBACILLUS AEROBIC BOTTLE ONLY Organism ID to follow CRITICAL RESULT CALLED TO, READ BACK BY AND VERIFIED WITH: J. Frens Pharm.D. 13:00 12/11/15 ([email or web address])    Culture VIRIDANS STREPTOCOCCUS (A)  Final   Report Status 12/16/2015 FINAL  Final   Organism ID, Bacteria VIRIDANS STREPTOCOCCUS  Final      Susceptibility   Viridans streptococcus - MIC*    ERYTHROMYCIN >=8 RESISTANT Resistant     LEVOFLOXACIN 2 SENSITIVE Sensitive     VANCOMYCIN 1 SENSITIVE Sensitive     * VIRIDANS STREPTOCOCCUS  Urine culture     Status: None   Collection Time: 12/10/15  7:00 PM  Result Value Ref Range Status   Specimen Description Urine  Final   Special Requests NONE  Final   Culture NO GROWTH  Final   Report Status 12/12/2015 FINAL  Final  Blood Culture ID Panel (Reflexed)     Status: Abnormal   Collection Time: 12/10/15  7:00 PM  Result Value Ref Range Status   Enterococcus species NOT DETECTED NOT DETECTED Final   Listeria monocytogenes NOT DETECTED NOT DETECTED Final   Staphylococcus species NOT DETECTED NOT DETECTED Final   Staphylococcus aureus NOT DETECTED NOT DETECTED Final   Streptococcus species DETECTED (A) NOT DETECTED Final    Comment: CRITICAL RESULT CALLED TO, READ BACK BY AND VERIFIED WITH: J. Frens Pharm.D. 13:00 12/11/15 ([email or web address])    Streptococcus agalactiae NOT DETECTED NOT DETECTED Final   Streptococcus pneumoniae NOT DETECTED NOT DETECTED Final   Streptococcus pyogenes NOT DETECTED NOT DETECTED Final    Acinetobacter baumannii NOT DETECTED NOT DETECTED Final   Enterobacteriaceae species NOT DETECTED NOT DETECTED Final   Enterobacter cloacae complex NOT DETECTED NOT DETECTED Final   Escherichia coli NOT DETECTED NOT DETECTED Final   Klebsiella oxytoca NOT DETECTED NOT DETECTED Final   Klebsiella pneumoniae NOT DETECTED NOT DETECTED Final   Proteus species NOT DETECTED NOT DETECTED Final   Serratia  marcescens NOT DETECTED NOT DETECTED Final   Haemophilus influenzae NOT DETECTED NOT DETECTED Final   Neisseria meningitidis NOT DETECTED NOT DETECTED Final   Pseudomonas aeruginosa NOT DETECTED NOT DETECTED Final   Candida albicans NOT DETECTED NOT DETECTED Final   Candida glabrata NOT DETECTED NOT DETECTED Final   Candida krusei NOT DETECTED NOT DETECTED Final   Candida parapsilosis NOT DETECTED NOT DETECTED Final   Candida tropicalis NOT DETECTED NOT DETECTED Final  Culture, blood (Routine x 2)     Status: Abnormal   Collection Time: 12/10/15  7:13 PM  Result Value Ref Range Status   Specimen Description BLOOD RIGHT ANTECUBITAL  Final   Special Requests BOTTLES DRAWN AEROBIC ONLY 5CC  Final   Culture  Setup Time   Final    GRAM POSITIVE COCCOBACILLUS AEROBIC BOTTLE ONLY CRITICAL RESULT CALLED TO, READ BACK BY AND VERIFIED WITH: J. Frens Pharm.D. 13:00 12/11/15 ([email or web address])    Culture (A)  Final    VIRIDANS STREPTOCOCCUS SUSCEPTIBILITIES PERFORMED ON PREVIOUS CULTURE WITHIN THE LAST 5 DAYS.    Report Status 12/16/2015 FINAL  Final  MRSA PCR Screening     Status: None   Collection Time: 12/11/15  6:27 AM  Result Value Ref Range Status   MRSA by PCR NEGATIVE NEGATIVE Final    Comment:        The GeneXpert MRSA Assay (FDA approved for NASAL specimens only), is one component of a comprehensive MRSA colonization surveillance program. It is not intended to diagnose MRSA infection nor to guide or monitor treatment for MRSA infections.   Aerobic/Anaerobic Culture (surgical/deep wound)      Status: None   Collection Time: 12/11/15  1:12 PM  Result Value Ref Range Status   Specimen Description ABDOMEN  Final   Special Requests NONE  Final   Gram Stain   Final    NO WBC SEEN FEW GRAM POSITIVE COCCI IN PAIRS AND CHAINS RARE GRAM NEGATIVE RODS    Culture   Final    MODERATE STREPTOCOCCUS SALIVARIUS NO ANAEROBES ISOLATED    Report Status 12/16/2015 FINAL  Final   Organism ID, Bacteria STREPTOCOCCUS SALIVARIUS  Final      Susceptibility   Streptococcus salivarius - MIC*    PENICILLIN 1 INTERMEDIATE Intermediate     CEFTRIAXONE 0.25 SENSITIVE Sensitive     ERYTHROMYCIN >=8 RESISTANT Resistant     LEVOFLOXACIN 2 SENSITIVE Sensitive     VANCOMYCIN 1 SENSITIVE Sensitive     * MODERATE STREPTOCOCCUS SALIVARIUS  Culture, blood (Routine X 2) w Reflex to ID Panel     Status: None (Preliminary result)   Collection Time: 12/14/15  3:29 PM  Result Value Ref Range Status   Specimen Description BLOOD RIGHT HAND  Final   Special Requests BOTTLES DRAWN AEROBIC ONLY 5CC  Final   Culture NO GROWTH 3 DAYS  Final   Report Status PENDING  Incomplete  Culture, blood (Routine X 2) w Reflex to ID Panel     Status: None (Preliminary result)   Collection Time: 12/14/15  3:33 PM  Result Value Ref Range Status   Specimen Description BLOOD LEFT HAND  Final   Special Requests IN PEDIATRIC BOTTLE 1.5CC  Final   Culture NO GROWTH 3 DAYS  Final   Report Status PENDING  Incomplete    Studies/Results: Ct Abdomen Pelvis W Contrast  Result Date: 12/15/2015 CLINICAL DATA:  Right upper quadrant abdominal pain. Patient is 2 weeks post cholecystectomy with bile leak. EXAM: CT ABDOMEN AND PELVIS  WITH CONTRAST TECHNIQUE: Multidetector CT imaging of the abdomen and pelvis was performed using the standard protocol following bolus administration of intravenous contrast. CONTRAST:  166mL ISOVUE-300 IOPAMIDOL (ISOVUE-300) INJECTION 61% COMPARISON:  CT of the abdomen 12/11/2015, ERCP 12/12/2015 FINDINGS: Lower  chest: Moderate in severity a calcific coronary artery atherosclerotic disease. Bilateral water density pleural effusions, right greater than left, with associated subsegmental and segmental atelectasis. Hepatobiliary: There is a 11.2 by 5.4 by 13.2 cm subcapsular water density fluid collection along the superior lateral margin of the liver. Percutaneous pigtail catheter is seen entering the abdomen from the right upper quadrant approach and terminating inferior to the liver in the gallbladder fossa. Patient is status post cholecystectomy. Small amount of water density free fluid is seen along the tract of the percutaneous drainage catheter and within the surgical bed. The collection within the surgical bed measures 5.6 x 2.3 cm, decreased from prior measurement of 7.2 x 3.8 cm. A metallic stent is present within the extrahepatic common bile duct terminating in the second portion of the duodenum. Pancreas: Unremarkable. No pancreatic ductal dilatation or surrounding inflammatory changes. Spleen: Normal in size without focal abnormality. . Adrenals/Urinary Tract: Adrenal glands are unremarkable. Kidneys are normal, without renal calculi, focal lesion, or hydronephrosis. Bladder is unremarkable. Stomach/Bowel: Stomach is within normal limits. Appendix appears normal. No evidence of bowel wall thickening, distention, or inflammatory changes. Vascular/Lymphatic: Aortic atherosclerosis. No enlarged abdominal or pelvic lymph nodes. Reproductive: Status post hysterectomy. No adnexal masses. Other: Small amount of water density fluid in the pelvis. Musculoskeletal: No acute osseous findings. 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 Superior endplate compression deformity of L1 vertebral body may be degenerative or represent mild compression fracture with chronic appearance. IMPRESSION: Interval placement of percutaneous drainage catheter within the gallbladder fossa with decrease of the  previously demonstrated biloma which now measures 5.6 x 2.3 cm (prior measurement of 7.2 x 3.8 cm ). This collection is contiguous with stable in size subcapsular hepatic collection which measures 11.2 x 5.4 x 13.2 cm. Common bile duct metallic stent in satisfactory position. Slightly increased in size bilateral pleural effusions, right greater than left, with right lower lobe segmental atelectasis. Advanced calcific atherosclerotic disease of the coronary arteries and distal abdominal aorta. Electronically Signed   By: Fidela Salisbury M.D.   On: 12/15/2015 16:46      Assessment/Plan:  INTERVAL HISTORY: patient with worsening nausea and vomiting   Active Problems:   Sepsis ([place])   Bile leak   Biloma   Biloma following surgery   Status post cholecystectomy   Infection by Streptococcus, viridans group   Routine screening for STI (sexually transmitted infection)    [name] is a 65 y.o. female with  Recent cholecystectomy with postoperative subjective fevers and low grade temperatures readmitted with fevers, confusion and found now to be bacteremic with Viridans group streptococci and with bile leak and intrabdominal infected bilious abscess sp IR drainage  #1 Viridans group streptococci: per micro the organism was I to PCn though I dont know the MIC and S to CTX  Given predilection for heart valves and given that her malaise symptoms could have been more than a few days would favor checking TEE to ensure no endocarditis  --pt should have TEE if possible TTE has been done  --repeat blood cultures NGTD  - continue rocephin IV and flagyl po  #2 Infected bilious fluid colleciton: growing S. Salivarious. My understanding is repeat CT planned    LOS:  6 days   Alcide Evener 12/17/2015, 12:42 PM

## 2015-12-17 NOTE — H&P (Addendum)
Patient ID: OSIA AYE MRN: [id number] DOB/AGE: 1950-11-11 65 y.o.  Admit date: 12/11/2015 Primary Physician   No primary care provider on file. Chief Complaint    Strep viridans sepsis  HPI: [name] is a 65 y.o. female with hypertension, hyperlipidemia, CKD3, and prior stroke admitted with cholecystitis s/p lap chole and ERCP with stent placement, then found to have fevers and underwent percutaneous drainage of a bileoma.  Fluid grew Strep salvarius and blood cultures grew Strep viridans.  She presents for TEE to evaluate for bacteremia.      Past Medical History:  Diagnosis Date  . Chronic kidney disease    stage 3  . GERD (gastroesophageal reflux disease)   . Hyperlipidemia   . Migraine headache   . Occipital neuralgia    Left  . Renal insufficiency   . Stroke [facility]) 10/2013    Medications Prior to Admission  Medication Sig Dispense Refill  . atorvastatin (LIPITOR) 40 MG tablet Take 1 tablet (40 mg total) by mouth daily. 90 tablet 3  . clopidogrel (PLAVIX) 75 MG tablet TAKE 1 TABLET BY MOUTH EVERY DAY 30 tablet 5  . doxepin (SINEQUAN) 100 MG capsule Take 2 capsules (200 mg total) by mouth at bedtime. 180 capsule 1  . furosemide (LASIX) 20 MG tablet Take 10 mg by mouth daily.  6  . gabapentin (NEURONTIN) 600 MG tablet Take 1 tablet (600 mg total) by mouth 2 (two) times daily. 180 tablet 1  . lidocaine (LIDODERM) 5 % Place 2 patches onto the skin daily. Remove & Discard patch within 12 hours or as directed by MD 60 patch 5  . meclizine (ANTIVERT) 25 MG tablet Take 1 tablet (25 mg total) by mouth 2 (two) times daily as needed for dizziness. 20 tablet 0  . ondansetron (ZOFRAN-ODT) 4 MG disintegrating tablet Take 1 tablet (4 mg total) by mouth every 6 (six) hours as needed for nausea. 20 tablet 0  . oxyCODONE (OXY IR/ROXICODONE) 5 MG immediate release tablet Take 1-2 tablets (5-10 mg total) by mouth every 4 (four) hours as needed for moderate pain. 30 tablet 0  .  ramipril (ALTACE) 2.5 MG capsule TAKE ONE CAPSULE BY MOUTH EVERY DAY 90 capsule 3  . tiZANidine (ZANAFLEX) 2 MG tablet Take 1 tablet (2 mg total) by mouth 3 (three) times daily. 270 tablet 1  . topiramate (TOPAMAX) 100 MG tablet TAKE 1 TABLET TWICE A DAY 180 tablet 0     Allergies  Allergen Reactions  . Shellfish Allergy Anaphylaxis and Other (See Comments)    All seafood    Social History   Social History  . Marital status: Married    Spouse name: [name]  . Number of children: 1  . Years of education: 12+   Occupational History  . [employer]  . CNA    Social History Main Topics  . Smoking status: Former Smoker    Packs/day: 0.30    Types: Cigarettes    Quit date: 11/01/2011  . Smokeless tobacco: Never Used  . Alcohol use No  . Drug use: No  . Sexual activity: Yes    Partners: Male    Birth control/ protection: Surgical     Comment: 1 sexual partner in last 12 months: married   Other Topics Concern  . Not on file   Social History Narrative   Patient is married ([name]).   Patient has one child.   Patient is working full-time.  Patient is right handed.   Patient has a college education.   Patient does not drink caffeine.    Family History  Problem Relation Age of Onset  . Cancer Mother 74    breast  . Alzheimer's disease Mother   . Cancer Brother   . Hyperlipidemia Brother   . Prostate cancer Father   . Lung cancer Father     PHYSICAL EXAM: Vitals:   12/16/15 2126 12/17/15 0449  BP: (!) 145/89 (!) 150/85  Pulse:  98  Resp:  19  Temp:  98.6 F (37 C)   General:  Well appearing. No respiratory difficulty HEENT: normal Neck: supple. no JVD. Carotids 2+ bilat; no bruits. No lymphadenopathy or thryomegaly appreciated. Cor: PMI nondisplaced. Regular rate & rhythm. No rubs, gallops or murmurs. Lungs: clear Abdomen: soft, nontender, nondistended. No hepatosplenomegaly. No bruits or masses. Good bowel sounds. Extremities: no  cyanosis, clubbing, rash, edema Neuro: alert & oriented x 3, cranial nerves grossly intact. moves all 4 extremities w/o difficulty. Affect pleasant.   Results for orders placed or performed during the hospital encounter of 12/11/15 (from the past 24 hour(s))  Protime-INR     Status: Abnormal   Collection Time: 12/17/15  4:47 AM  Result Value Ref Range   Prothrombin Time 17.5 (H) 11.4 - 15.2 seconds   INR 1.42    Ct Abdomen Pelvis W Contrast  Result Date: 12/15/2015 CLINICAL DATA:  Right upper quadrant abdominal pain. Patient is 2 weeks post cholecystectomy with bile leak. EXAM: CT ABDOMEN AND PELVIS WITH CONTRAST TECHNIQUE: Multidetector CT imaging of the abdomen and pelvis was performed using the standard protocol following bolus administration of intravenous contrast. CONTRAST:  123mL ISOVUE-300 IOPAMIDOL (ISOVUE-300) INJECTION 61% COMPARISON:  CT of the abdomen 12/11/2015, ERCP 12/12/2015 FINDINGS: Lower chest: Moderate in severity a calcific coronary artery atherosclerotic disease. Bilateral water density pleural effusions, right greater than left, with associated subsegmental and segmental atelectasis. Hepatobiliary: There is a 11.2 by 5.4 by 13.2 cm subcapsular water density fluid collection along the superior lateral margin of the liver. Percutaneous pigtail catheter is seen entering the abdomen from the right upper quadrant approach and terminating inferior to the liver in the gallbladder fossa. Patient is status post cholecystectomy. Small amount of water density free fluid is seen along the tract of the percutaneous drainage catheter and within the surgical bed. The collection within the surgical bed measures 5.6 x 2.3 cm, decreased from prior measurement of 7.2 x 3.8 cm. A metallic stent is present within the extrahepatic common bile duct terminating in the second portion of the duodenum. Pancreas: Unremarkable. No pancreatic ductal dilatation or surrounding inflammatory changes. Spleen:  Normal in size without focal abnormality. . Adrenals/Urinary Tract: Adrenal glands are unremarkable. Kidneys are normal, without renal calculi, focal lesion, or hydronephrosis. Bladder is unremarkable. Stomach/Bowel: Stomach is within normal limits. Appendix appears normal. No evidence of bowel wall thickening, distention, or inflammatory changes. Vascular/Lymphatic: Aortic atherosclerosis. No enlarged abdominal or pelvic lymph nodes. Reproductive: Status post hysterectomy. No adnexal masses. Other: Small amount of water density fluid in the pelvis. Musculoskeletal: No acute osseous findings. 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 Superior endplate compression deformity of L1 vertebral body may be degenerative or represent mild compression fracture with chronic appearance. IMPRESSION: Interval placement of percutaneous drainage catheter within the gallbladder fossa with decrease of the previously demonstrated biloma which now measures 5.6 x 2.3 cm (prior measurement of 7.2 x 3.8 cm ). This collection is contiguous with stable  in size subcapsular hepatic collection which measures 11.2 x 5.4 x 13.2 cm. Common bile duct metallic stent in satisfactory position. Slightly increased in size bilateral pleural effusions, right greater than left, with right lower lobe segmental atelectasis. Advanced calcific atherosclerotic disease of the coronary arteries and distal abdominal aorta. Electronically Signed   By: Fidela Salisbury M.D.   On: 12/15/2015 16:46     ASSESSMENT/PLAN: [name] is a 65 y.o. female who has presented today for TEE due to S. Viridans sepsis.  However, the procedure is postponed due to nausea and emesis.  We will be happy to reschedule when this is resolved, as it represents and aspiration risk.   Signed: Drea Jurewicz C. Oval Linsey, MD, [facility]  12/17/2015, 6:11 AM

## 2015-12-17 NOTE — Progress Notes (Signed)
0730 Pt returned from Endo, TEE was cancelled due to pt's persistent nausea and vomiting. 1000 Pt to IR for placement of Biloma drain. Received pt back with a drain to RUQ, dressing dry and intact, w/ tea-colored drainage noted.

## 2015-12-17 NOTE — Procedures (Signed)
1. Injection of GB fossa drain 2. Placement of new biloma drain No complication No blood loss. See complete dictation in [facility].

## 2015-12-17 NOTE — Progress Notes (Signed)
*  PRELIMINARY RESULTS* Echocardiogram 2D Echocardiogram has been performed.  Leavy Cella 12/17/2015, 11:36 AM

## 2015-12-17 NOTE — Progress Notes (Signed)
Daily Rounding Note  12/17/2015, 11:39 AM  LOS: 6 days    [facility] Attending   I have taken an interval history, reviewed the chart and examined the patient. I agree with the Advanced Practitioner's note, impression and recommendations.  See my additions in bold  Gatha Mayer, MD, [facility] [phone] (pager) [phone] after 5 PM, weekends and holidays  12/17/2015 3:39 PM    ASSESMENT:   * Post chole bile leak. IR placed catheter drain to biloma 10/3.  ERCP 10/4: leak from right intrahepatic branch duct, no cystic duct leak.  S/p plastic stent placement to CBD. Repeat CT 10/8: biloma smaller.  Stable in size, large 13 x 11 x 5 cm subcapsular fluid collection.  Unable to tolerate po.  IVF at just 85ml/hour  *  post ERCP elevation of Lipase.  Pancreas normal, unremarkable on both CT scans.  Lipase has normalized.    *  Strep Viridans septicemia (s to Levaquin, vanc).  Strep salivarius in bile (s to rocephin).  WBCs climbing.  Was for TEE today (ordered by ID, Dr Tommy Medal) to assess for valvular involvement given strep Viridans.  This cancelled due to pt n/v.   He is guiding abx therapy.  Getting TTE  *  Coagulopathy. Mild increase PT, normal INR  *  Normocytic anemia.    PLAN   *  IR plans to address undrained subcapsular collection.  Hopefully this will help with the abdominal pain.  *  Increase IVF to 150/hour.  She is taking no PO and will get additional dye load today.    Azucena Freed  12/17/2015, 11:39 AM Pager: [phone]  Additional drain placed and she actually has less out today - taking some soda and crackers and feels a bit better  Sit tight with stent she has and follow for now  Too soon to say it has failed  Gatha Mayer, MD, [facility]  SUBJECTIVE:   Chief complaint: upper abdominal pain persists.  Some n/v.  Biloma drainage 1465 cc on 10/7, 705 cc on 10/8    OBJECTIVE:         Vital signs in last 24 hours:    Temp:  [97.5 F (36.4 C)-99.5 F (37.5 C)] 98.4 F (36.9 C) (10/09 0725) Pulse Rate:  [96-110] 96 (10/09 0725) Resp:  [19-29] 29 (10/09 0725) BP: (145-168)/(85-92) 168/92 (10/09 0725) SpO2:  [95 %-96 %] 95 % (10/09 0725) Weight:  [75.3 kg (166 lb)] 75.3 kg (166 lb) (10/09 0725) Last BM Date: 12/16/15 Filed Weights   12/10/15 1837 12/10/15 1847 12/17/15 0725  Weight: 74.8 kg (165 lb) 75.3 kg (166 lb) 75.3 kg (166 lb)   General: looks ill and moderately uncomfortable   Heart: RRR Chest: diminished BS on right, slight dyspnea.  No cough Abdomen: soft, tender upper abdomen, epigastrium and RUQ.  BS absent, no tinkling or tympanitic sounds.  Clear, dark bile in drain bag.   Extremities: no LE edema.  Left arm swelling Neuro/Psych:  [place], oriented x 3. No limb weakness, tremor or gross deficits.   Intake/Output from previous day: 10/08 0701 - 10/09 0700 In: 1370 [P.O.:120; I.V.:1195; IV Piggyback:50] Out: 705 [Drains:705]   Lab Results:  Recent Labs  12/15/15 0312 12/16/15 0241  WBC 11.7* 15.4*  HGB 10.3* 9.9*  HCT 31.4* 30.8*  PLT PLATELET CLUMPS NOTED ON SMEAR, COUNT APPEARS ADEQUATE 500*   BMET  Recent Labs  12/15/15 0312 12/16/15 0241  NA 138 138  K 4.8 4.1  CL 107 103  CO2 22 25  GLUCOSE 102* 98  BUN <5* <5*  CREATININE 0.73 0.71  CALCIUM 7.8* 8.2*   LFT  Recent Labs  12/16/15 0241  PROT 5.3*  ALBUMIN 1.8*  AST 45*  ALT 36  ALKPHOS 132*  BILITOT 0.4   PT/INR  Recent Labs  12/17/15 0447  LABPROT 17.5*  INR 1.42

## 2015-12-18 DIAGNOSIS — Z978 Presence of other specified devices: Secondary | ICD-10-CM

## 2015-12-18 LAB — CBC
HCT: 34.5 % — ABNORMAL LOW (ref 36.0–46.0)
Hemoglobin: 10.9 g/dL — ABNORMAL LOW (ref 12.0–15.0)
MCH: 28.2 pg (ref 26.0–34.0)
MCHC: 31.6 g/dL (ref 30.0–36.0)
MCV: 89.4 fL (ref 78.0–100.0)
Platelets: 597 10*3/uL — ABNORMAL HIGH (ref 150–400)
RBC: 3.86 MIL/uL — ABNORMAL LOW (ref 3.87–5.11)
RDW: 15.4 % (ref 11.5–15.5)
WBC: 10.3 10*3/uL (ref 4.0–10.5)

## 2015-12-18 NOTE — Progress Notes (Signed)
Daily Rounding Note  12/18/2015, 8:24 AM  LOS: 7 days   SUBJECTIVE:   Chief complaint: abdominal pain: worse with movement, mostly in RUQ.  Nausea when pain strikes.  Feeling like she will be able to tolerate solids, but still with anorexia.   Small, soft BMs, 3 to 4 yesterday.       5cc output from new RUQ, subphrenic drain reported 10/9 3cc drainage from initial biloma drain  OBJECTIVE:         Vital signs in last 24 hours:    Temp:  [98.7 F (37.1 C)] 98.7 F (37.1 C) (10/09 1316) Pulse Rate:  [99-104] 99 (10/09 1316) Resp:  [18-35] 18 (10/09 1316) BP: (142-153)/(77-86) 143/79 (10/09 1316) SpO2:  [94 %-98 %] 96 % (10/09 1316) Last BM Date: 12/16/15 Filed Weights   12/10/15 1837 12/10/15 1847 12/17/15 0725  Weight: 74.8 kg (165 lb) 75.3 kg (166 lb) 75.3 kg (166 lb)   General: alert, comfortable.  Still looks ill   Heart: RRR Chest: clear bil.  No dyspnea Abdomen: tender on right.  ~ 225 cc of light brown, clear bilious drainage in the new drain.  Hardly any bile in older drain.  BS hypoactive.  ND Extremities: no CCE.  Purpura on arms and legs Neuro/Psych:  Alert, oriented x3.  Calm, affect blunted.  Moves all 4 limbs.  No tremor or gross deficits/weakness.  Intake/Output from previous day: 10/09 0701 - 10/10 0700 In: 807.2 [P.O.:360; I.V.:389.2; IV Piggyback:50] Out: 1450 [Drains:1450]  Intake/Output this shift: Total I/O In: -  Out: 125 [Drains:125]  Lab Results:  Recent Labs  12/16/15 0241  WBC 15.4*  HGB 9.9*  HCT 30.8*  PLT 500*   BMET  Recent Labs  12/16/15 0241  NA 138  K 4.1  CL 103  CO2 25  GLUCOSE 98  BUN <5*  CREATININE 0.71  CALCIUM 8.2*   LFT  Recent Labs  12/16/15 0241  PROT 5.3*  ALBUMIN 1.8*  AST 45*  ALT 36  ALKPHOS 132*  BILITOT 0.4   PT/INR  Recent Labs  12/17/15 0447  LABPROT 17.5*  INR 1.42    ASSESMENT:   * Post chole bile leak. 10/3 drain  placed to biloma.  ERCP 10/4: leak from right intrahepatic branch duct, no cystic duct leak. S/p plastic stent placement to CBD. Repeat CT 10/8: biloma smaller.  Stable in size, large 13 x 11 x 5 cm subcapsular fluid collection.  10/9 new biloma drain placed.   *  Strep Viridans septicemia (s to Levaquin, vanc).  Strep salivarius in bile (s to rocephin).  WBCs climbing. TEE for 10/9 cxl'd due to n/v; ordered by  Dr Tommy Medal to assess for valvular involvement.  2 D echo without valve issues.  On Flagyl, Rocephin  *  Coagulopathy. Mild increase PT, normal INR  *  Normocytic anemia.    *  PCM.  Ordered solid food.  Hopefully will tolerate so we can avoid TPN.     PLAN   *  Continue current care.  CBC in AM.      [name]  12/18/2015, 8:24 AM Pager: [phone]    [facility] Attending   I have taken an interval history, reviewed the chart and examined the patient. I agree with the Advanced Practitioner's note, impression and recommendations.     Drainage seems less  Hope that trend continues  She is better overall  Gatha Mayer,  MD, [facility] [phone] (pager) [phone] after 5 PM, weekends and holidays  12/18/2015 6:34 PM

## 2015-12-18 NOTE — Care Management Note (Signed)
Case Management Note  Patient Details  Name: [name] MRN: [id number] Date of Birth: 13-Jan-1951  Subjective/Objective:                    Action/Plan:  Spoke to patient and husband at bedside . Confirmed face sheet information . Offered choice , if patient needs IV ABX at home she would like [facility] [place] [facility]. Patient and husband aware they would be trained to administer ABX if home IV ABX needed. Referral given to [place] , will continue to follow. Will need HHRN order, face to face and prescription. Thanks Magdalen Spatz RN BSN [phone]  Expected Discharge Date:                  Expected Discharge Plan:  [place]  In-House Referral:     Discharge planning Services  CM Consult  Post Acute Care Choice:    Choice offered to:  Patient, Spouse  DME Arranged:    DME Agency:     HH Arranged:    [place] Agency:     Status of Service:  In process, will continue to follow  If discussed at Long Length of Stay Meetings, dates discussed:    Additional Comments:  Marilu Favre, RN 12/18/2015, 11:44 AM

## 2015-12-18 NOTE — Progress Notes (Signed)
Referring Physician(s): Dr Stark Klein  Supervising Physician: Arne Cleveland  Patient Status:  Inpatient  Chief Complaint:  Bile leak  Post cholecystectomy/biloma Biloma/GB fossa drain placed 10/3 New Biloma drain placed 10/9   Subjective:  10/9: 1. Injection of GB fossa drain 2. Placement of new biloma drain  Feels better last night and today Less N/V Eating crackers and soda---no vomit since yesterday abd pain still---but manageable   Allergies: Shellfish allergy  Medications: Prior to Admission medications   Medication Sig Start Date End Date Taking? Authorizing Provider  atorvastatin (LIPITOR) 40 MG tablet Take 1 tablet (40 mg total) by mouth daily. 01/04/15  Yes Elberta Leatherwood, MD  clopidogrel (PLAVIX) 75 MG tablet TAKE 1 TABLET BY MOUTH EVERY DAY 09/04/15  Yes Elberta Leatherwood, MD  doxepin (SINEQUAN) 100 MG capsule Take 2 capsules (200 mg total) by mouth at bedtime. 10/15/14  Yes Kathrynn Ducking, MD  furosemide (LASIX) 20 MG tablet Take 10 mg by mouth daily. 05/09/14  Yes Historical Provider, MD  gabapentin (NEURONTIN) 600 MG tablet Take 1 tablet (600 mg total) by mouth 2 (two) times daily. 12/05/15  Yes Charlett Blake, MD  lidocaine (LIDODERM) 5 % Place 2 patches onto the skin daily. Remove & Discard patch within 12 hours or as directed by MD 11/28/15  Yes Charlett Blake, MD  meclizine (ANTIVERT) 25 MG tablet Take 1 tablet (25 mg total) by mouth 2 (two) times daily as needed for dizziness. 07/16/15  Yes Elberta Leatherwood, MD  ondansetron (ZOFRAN-ODT) 4 MG disintegrating tablet Take 1 tablet (4 mg total) by mouth every 6 (six) hours as needed for nausea. 12/08/15  Yes Jerrye Beavers, PA-C  oxyCODONE (OXY IR/ROXICODONE) 5 MG immediate release tablet Take 1-2 tablets (5-10 mg total) by mouth every 4 (four) hours as needed for moderate pain. 12/08/15  Yes Brooke A Miller, PA-C  ramipril (ALTACE) 2.5 MG capsule TAKE ONE CAPSULE BY MOUTH EVERY DAY 10/01/15  Yes Elberta Leatherwood, MD    tiZANidine (ZANAFLEX) 2 MG tablet Take 1 tablet (2 mg total) by mouth 3 (three) times daily. 09/12/15  Yes Ward Givens, NP  topiramate (TOPAMAX) 100 MG tablet TAKE 1 TABLET TWICE A DAY 05/07/15  Yes Kathrynn Ducking, MD  sertraline (ZOLOFT) 50 MG tablet TAKE 1 TABLET (50 MG TOTAL) BY MOUTH 2 (TWO) TIMES DAILY. 12/13/15   Elberta Leatherwood, MD     Vital Signs: BP (!) 143/79 (BP Location: Right Arm)   Pulse 99   Temp 98.7 F (37.1 C) (Oral)   Resp 18   Ht 5\' 7"  (1.702 m)   Wt 166 lb (75.3 kg)   SpO2 96%   BMI 26.00 kg/m   Physical Exam  Abdominal: Soft. There is tenderness.  Musculoskeletal: Normal range of motion.  Neurological: She is alert.  Skin: Skin is warm and dry.  Sites of drain are clean and dry Tender No bleeding Output both : Bile Drain 1: 300 c yesterday Drain 2: 1.4 L yesterday Still great output this am  Nursing note and vitals reviewed.   Imaging: Ct Abdomen Pelvis W Contrast  Result Date: 12/15/2015 CLINICAL DATA:  Right upper quadrant abdominal pain. Patient is 2 weeks post cholecystectomy with bile leak. EXAM: CT ABDOMEN AND PELVIS WITH CONTRAST TECHNIQUE: Multidetector CT imaging of the abdomen and pelvis was performed using the standard protocol following bolus administration of intravenous contrast. CONTRAST:  159mL ISOVUE-300 IOPAMIDOL (ISOVUE-300) INJECTION 61% COMPARISON:  CT  of the abdomen 12/11/2015, ERCP 12/12/2015 FINDINGS: Lower chest: Moderate in severity a calcific coronary artery atherosclerotic disease. Bilateral water density pleural effusions, right greater than left, with associated subsegmental and segmental atelectasis. Hepatobiliary: There is a 11.2 by 5.4 by 13.2 cm subcapsular water density fluid collection along the superior lateral margin of the liver. Percutaneous pigtail catheter is seen entering the abdomen from the right upper quadrant approach and terminating inferior to the liver in the gallbladder fossa. Patient is status post  cholecystectomy. Small amount of water density free fluid is seen along the tract of the percutaneous drainage catheter and within the surgical bed. The collection within the surgical bed measures 5.6 x 2.3 cm, decreased from prior measurement of 7.2 x 3.8 cm. A metallic stent is present within the extrahepatic common bile duct terminating in the second portion of the duodenum. Pancreas: Unremarkable. No pancreatic ductal dilatation or surrounding inflammatory changes. Spleen: Normal in size without focal abnormality. . Adrenals/Urinary Tract: Adrenal glands are unremarkable. Kidneys are normal, without renal calculi, focal lesion, or hydronephrosis. Bladder is unremarkable. Stomach/Bowel: Stomach is within normal limits. Appendix appears normal. No evidence of bowel wall thickening, distention, or inflammatory changes. Vascular/Lymphatic: Aortic atherosclerosis. No enlarged abdominal or pelvic lymph nodes. Reproductive: Status post hysterectomy. No adnexal masses. Other: Small amount of water density fluid in the pelvis. Musculoskeletal: No acute osseous findings. 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 Superior endplate compression deformity of L1 vertebral body may be degenerative or represent mild compression fracture with chronic appearance. IMPRESSION: Interval placement of percutaneous drainage catheter within the gallbladder fossa with decrease of the previously demonstrated biloma which now measures 5.6 x 2.3 cm (prior measurement of 7.2 x 3.8 cm ). This collection is contiguous with stable in size subcapsular hepatic collection which measures 11.2 x 5.4 x 13.2 cm. Common bile duct metallic stent in satisfactory position. Slightly increased in size bilateral pleural effusions, right greater than left, with right lower lobe segmental atelectasis. Advanced calcific atherosclerotic disease of the coronary arteries and distal abdominal aorta. Electronically Signed   By: Fidela Salisbury M.D.   On: 12/15/2015 16:46   Ir Sinus/fist Tube Chk-non Gi  Result Date: 12/17/2015 CLINICAL DATA:  Bile leak post cholecystectomy despite endoscopic stent. Pigtail drain catheter was placed in the gallbladder fossa but there is continued output. EXAM: ABDOMINAL DRAIN CATHETER INJECTION UNDER FLUOROSCOPY TECHNIQUE: The procedure, risks (including but not limited to bleeding, infection, organ damage ), benefits, and alternatives were explained to the patient. Questions regarding the procedure were encouraged and answered. The patient understands and consents to the procedure. Survey fluoroscopic inspection reveals stable position of the pigtail drain catheter in the gallbladder fossa. Injection demonstrates patency of the drain catheter. There is a small residual cavity. With slight distention, there is some reflux of contrast into the intrahepatic biliary tree. With aspiration, the cavity is completely evacuated. IMPRESSION: 1. Continued biliary leak, draining into the gallbladder fossa, well controlled by the percutaneous drain catheter. Catheter was left in place, returned to external drainage. Electronically Signed   By: Lucrezia Europe M.D.   On: 12/17/2015 16:47   Ir Guided Niel Hummer W Catheter Placement  Result Date: 12/17/2015 CLINICAL DATA:  Bile leak post cholecystectomy despite endoscopic stent placement. Continued output from gallbladder fossa drain. 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 EXAM: ULTRASOUND AND FLUOROSCOPIC GUIDED DRAINAGE OF PERIHEPATIC BILOMA ANESTHESIA/SEDATION: Intravenous Fentanyl and Versed were administered as conscious sedation during continuous monitoring of the patient's  level of consciousness and physiological / cardiorespiratory status by the radiology RN, with a total moderate sedation time of 10 minutes. PROCEDURE: The procedure, risks, benefits, and alternatives were explained to the patient. Questions regarding the procedure  were encouraged and answered. The patient understands and consents to the procedure. 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 The operative field was prepped with chlorhexidinein a sterile fashion, and a sterile drape was applied covering the operative field. A sterile gown and sterile gloves were used for the procedure. Local anesthesia was provided with 1% Lidocaine. Under real-time ultrasound guidance, a 21 gauge micropuncture needle was advanced into the collection. Thin yellow-green clear fluid could be aspirated. An 018 guidewire advanced centrally within the collection, its position confirmed on fluoroscopy. This was exchanged using the transitional dilator for short Amplatz wire, and the tract was dilated to facilitate placement of a 12 French pigtail catheter, formed centrally within the collection. Small contrast injection under fluoro confirms appropriate positioning and patency. Catheter secured externally with 0 Prolene suture and StatLock and placed to gravity drain bag. The patient tolerated the procedure well. COMPLICATIONS: None immediate FINDINGS: The right perihepatic subphrenic biloma was again localized. Twelve French pigtail drain catheter placed under ultrasound and fluoroscopic guidance as above. IMPRESSION: 1. Technically successful perihepatic subphrenic biloma drain catheter placement under ultrasound and fluoroscopic guidance. Electronically Signed   By: Lucrezia Europe M.D.   On: 12/17/2015 16:45   Ir US Guide Bx Asp/drain  Result Date: 12/17/2015 CLINICAL DATA:  Bile leak post cholecystectomy despite endoscopic stent placement. Continued output from gallbladder fossa drain. 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 EXAM: ULTRASOUND AND FLUOROSCOPIC GUIDED DRAINAGE OF PERIHEPATIC BILOMA ANESTHESIA/SEDATION: Intravenous Fentanyl and Versed were administered  as conscious sedation during continuous monitoring of the patient's level of consciousness and physiological / cardiorespiratory status by the radiology RN, with a total moderate sedation time of 10 minutes. PROCEDURE: The procedure, risks, benefits, and alternatives were explained to the patient. Questions regarding the procedure were encouraged and answered. The patient understands and consents to the procedure. 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 The operative field was prepped with chlorhexidinein a sterile fashion, and a sterile drape was applied covering the operative field. A sterile gown and sterile gloves were used for the procedure. Local anesthesia was provided with 1% Lidocaine. Under real-time ultrasound guidance, a 21 gauge micropuncture needle was advanced into the collection. Thin yellow-green clear fluid could be aspirated. An 018 guidewire advanced centrally within the collection, its position confirmed on fluoroscopy. This was exchanged using the transitional dilator for short Amplatz wire, and the tract was dilated to facilitate placement of a 12 French pigtail catheter, formed centrally within the collection. Small contrast injection under fluoro confirms appropriate positioning and patency. Catheter secured externally with 0 Prolene suture and StatLock and placed to gravity drain bag. The patient tolerated the procedure well. COMPLICATIONS: None immediate FINDINGS: The right perihepatic subphrenic biloma was again localized. Twelve French pigtail drain catheter placed under ultrasound and fluoroscopic guidance as above. IMPRESSION: 1. Technically successful perihepatic subphrenic biloma drain catheter placement under ultrasound and fluoroscopic guidance. Electronically Signed   By: Lucrezia Europe M.D.   On: 12/17/2015 16:45    Labs:  CBC:  Recent Labs  12/13/15 0444 12/14/15 0453  12/15/15 0312 12/16/15 0241  WBC 11.4* 16.2* 11.7* 15.4*  HGB 10.3* 11.3* 10.3* 9.9*  HCT 31.4* 34.6* 31.4* 30.8*  PLT 373 578* PLATELET CLUMPS NOTED ON SMEAR, COUNT APPEARS ADEQUATE 500*    COAGS:  Recent Labs  12/05/15 0406 12/11/15 0729 12/17/15 0447  INR 1.03 1.10 1.42  APTT 33  --   --     BMP:  Recent Labs  12/13/15 0444 12/14/15 0453 12/15/15 0312 12/16/15 0241  NA 140 137 138 138  K 2.7* 3.1* 4.8 4.1  CL 105 105 107 103  CO2 23 20* 22 25  GLUCOSE 111* 108* 102* 98  BUN 9 <5* <5* <5*  CALCIUM 7.6* 8.0* 7.8* 8.2*  CREATININE 0.77 0.87 0.73 0.71  GFRNONAA >60 >60 >60 >60  GFRAA >60 >60 >60 >60    LIVER FUNCTION TESTS:  Recent Labs  12/12/15 0241 12/13/15 0444 12/14/15 0453 12/16/15 0241  BILITOT 0.8 0.8 0.6 0.4  AST 27 30 43* 45*  ALT 28 26 31  36  ALKPHOS 125 125 148* 132*  PROT 5.0* 5.3* 6.3* 5.3*  ALBUMIN 2.0* 1.9* 2.3* 1.8*    Assessment and Plan:  Cholecystectomy Post op biloma and bile leak Biloma/GB fossa drain placed 10/3 ERCP and stent placed 10/4 Developed pancreatitis Leak continue--new collection New biloma drain placed 10/9 Output bile and great for both drains Plan per CCS and GI  Electronically Signed: Stina Gane A 12/18/2015, 8:03 AM   I spent a total of 15 Minutes at the the patient's bedside AND on the patient's hospital floor or unit, greater than 50% of which was counseling/coordinating care for biloma drains

## 2015-12-18 NOTE — Progress Notes (Signed)
[facility] Progress Note  1 Day Post-Op  Subjective: Pain around drain sites is improving - rated 6/10. Nausea much improved from yesterday - tolerating ginger ale and crackers. Having loose BMs. Pulling 750-1,000 on IS. Ambulating in room.   Daughter-in-law concerned with minor swelling in patients ankles/hands. Discussed likely secondary to nutritional status.  Drains: Right abdominal drain: 1,275 cc/24h GB fossa drain: 300 cc/24h  Objective: Vital signs in last 24 hours: Temp:  [98.7 F (37.1 C)] 98.7 F (37.1 C) (10/09 1316) Pulse Rate:  [99-104] 99 (10/09 1316) Resp:  [18-35] 18 (10/09 1316) BP: (142-153)/(77-86) 143/79 (10/09 1316) SpO2:  [94 %-98 %] 96 % (10/09 1316) Last BM Date: 12/16/15  Intake/Output from previous day: 10/09 0701 - 10/10 0700 In: 807.2 [P.O.:360; I.V.:389.2; IV Piggyback:50] Out: 1450 [Drains:1450] Intake/Output this shift: Total I/O In: -  Out: 125 [Drains:125]  PE: Gen:  Alert, NAD, pleasant Pulm:  CTA, no W/R/R Abd: Soft, appropriately tender, ND, +BS, RUQ drains c/d/i with bilious output Ext:  No erythema. Mild swelling in BL ankles.  Lab Results:   Recent Labs  12/16/15 0241  WBC 15.4*  HGB 9.9*  HCT 30.8*  PLT 500*   BMET  Recent Labs  12/16/15 0241  NA 138  K 4.1  CL 103  CO2 25  GLUCOSE 98  BUN <5*  CREATININE 0.71  CALCIUM 8.2*   PT/INR  Recent Labs  12/17/15 0447  LABPROT 17.5*  INR 1.42   CMP     Component Value Date/Time   NA 138 12/16/2015 0241   K 4.1 12/16/2015 0241   CL 103 12/16/2015 0241   CO2 25 12/16/2015 0241   GLUCOSE 98 12/16/2015 0241   BUN <5 (L) 12/16/2015 0241   CREATININE 0.71 12/16/2015 0241   CREATININE 1.17 (H) 07/13/2015 1056   CALCIUM 8.2 (L) 12/16/2015 0241   PROT 5.3 (L) 12/16/2015 0241   ALBUMIN 1.8 (L) 12/16/2015 0241   AST 45 (H) 12/16/2015 0241   ALT 36 12/16/2015 0241   ALKPHOS 132 (H) 12/16/2015 0241   BILITOT 0.4 12/16/2015 0241   GFRNONAA >60  12/16/2015 0241   GFRNONAA 50 (L) 07/13/2015 1056   GFRAA >60 12/16/2015 0241   GFRAA 57 (L) 07/13/2015 1056   Lipase     Component Value Date/Time   LIPASE 47 12/16/2015 0241   Studies/Results: Ir Sinus/fist Tube Chk-non Gi  Result Date: 12/17/2015 CLINICAL DATA:  Bile leak post cholecystectomy despite endoscopic stent. Pigtail drain catheter was placed in the gallbladder fossa but there is continued output. EXAM: ABDOMINAL DRAIN CATHETER INJECTION UNDER FLUOROSCOPY TECHNIQUE: The procedure, risks (including but not limited to bleeding, infection, organ damage ), benefits, and alternatives were explained to the patient. Questions regarding the procedure were encouraged and answered. The patient understands and consents to the procedure. Survey fluoroscopic inspection reveals stable position of the pigtail drain catheter in the gallbladder fossa. Injection demonstrates patency of the drain catheter. There is a small residual cavity. With slight distention, there is some reflux of contrast into the intrahepatic biliary tree. With aspiration, the cavity is completely evacuated. IMPRESSION: 1. Continued biliary leak, draining into the gallbladder fossa, well controlled by the percutaneous drain catheter. Catheter was left in place, returned to external drainage. Electronically Signed   By: Lucrezia Europe M.D.   On: 12/17/2015 16:47   Ir Guided Niel Hummer W Catheter Placement  Result Date: 12/17/2015 CLINICAL DATA:  Bile leak post cholecystectomy despite endoscopic stent placement. Continued output from gallbladder  fossa drain. 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 EXAM: ULTRASOUND AND FLUOROSCOPIC GUIDED DRAINAGE OF PERIHEPATIC BILOMA ANESTHESIA/SEDATION: Intravenous Fentanyl and Versed were administered as conscious sedation during continuous monitoring of the patient's level of consciousness and physiological / cardiorespiratory status by the radiology RN, with a total  moderate sedation time of 10 minutes. PROCEDURE: The procedure, risks, benefits, and alternatives were explained to the patient. Questions regarding the procedure were encouraged and answered. The patient understands and consents to the procedure. 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 The operative field was prepped with chlorhexidinein a sterile fashion, and a sterile drape was applied covering the operative field. A sterile gown and sterile gloves were used for the procedure. Local anesthesia was provided with 1% Lidocaine. Under real-time ultrasound guidance, a 21 gauge micropuncture needle was advanced into the collection. Thin yellow-green clear fluid could be aspirated. An 018 guidewire advanced centrally within the collection, its position confirmed on fluoroscopy. This was exchanged using the transitional dilator for short Amplatz wire, and the tract was dilated to facilitate placement of a 12 French pigtail catheter, formed centrally within the collection. Small contrast injection under fluoro confirms appropriate positioning and patency. Catheter secured externally with 0 Prolene suture and StatLock and placed to gravity drain bag. The patient tolerated the procedure well. COMPLICATIONS: None immediate FINDINGS: The right perihepatic subphrenic biloma was again localized. Twelve French pigtail drain catheter placed under ultrasound and fluoroscopic guidance as above. IMPRESSION: 1. Technically successful perihepatic subphrenic biloma drain catheter placement under ultrasound and fluoroscopic guidance. Electronically Signed   By: Lucrezia Europe M.D.   On: 12/17/2015 16:45   Ir US Guide Bx Asp/drain  Result Date: 12/17/2015 CLINICAL DATA:  Bile leak post cholecystectomy despite endoscopic stent placement. Continued output from gallbladder fossa drain. Little change in subphrenic perihepatic biloma since drain  catheter placement. EXAM: ULTRASOUND AND FLUOROSCOPIC GUIDED DRAINAGE OF PERIHEPATIC BILOMA ANESTHESIA/SEDATION: Intravenous Fentanyl and Versed were administered as conscious sedation during continuous monitoring of the patient's level of consciousness and physiological / cardiorespiratory status by the radiology RN, with a total moderate sedation time of 10 minutes. PROCEDURE: The procedure, risks, benefits, and alternatives were explained to the patient. Questions regarding the procedure were encouraged and answered. The patient understands and consents to the procedure. 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 The operative field was prepped with chlorhexidinein a sterile fashion, and a sterile drape was applied covering the operative field. A sterile gown and sterile gloves were used for the procedure. Local anesthesia was provided with 1% Lidocaine. Under real-time ultrasound guidance, a 21 gauge micropuncture needle was advanced into the collection. Thin yellow-green clear fluid could be aspirated. An 018 guidewire advanced centrally within the collection, its position confirmed on fluoroscopy. This was exchanged using the transitional dilator for short Amplatz wire, and the tract was dilated to facilitate placement of a 12 French pigtail catheter, formed centrally within the collection. Small contrast injection under fluoro confirms appropriate positioning and patency. Catheter secured externally with 0 Prolene suture and StatLock and placed to gravity drain bag. The patient tolerated the procedure well. COMPLICATIONS: None immediate FINDINGS: The right perihepatic subphrenic biloma was again localized. Twelve French pigtail drain catheter placed under ultrasound and fluoroscopic guidance as above. IMPRESSION: 1. Technically successful perihepatic subphrenic biloma drain catheter placement under ultrasound and  fluoroscopic guidance. Electronically  Signed   By: Lucrezia Europe M.D.   On: 12/17/2015 16:45   Anti-infectives: Anti-infectives    Start     Dose/Rate Route Frequency Ordered Stop   12/14/15 1445  cefTRIAXone (ROCEPHIN) 2 g in dextrose 5 % 50 mL IVPB     2 g 100 mL/hr over 30 Minutes Intravenous Every 24 hours 12/14/15 1435     12/14/15 1445  metroNIDAZOLE (FLAGYL) tablet 500 mg     500 mg Oral Every 8 hours 12/14/15 1435     12/12/15 0930  vancomycin (VANCOCIN) IVPB 1000 mg/200 mL premix  Status:  Discontinued     1,000 mg 200 mL/hr over 60 Minutes Intravenous Every 12 hours 12/12/15 0913 12/14/15 1435   12/11/15 0800  piperacillin-tazobactam (ZOSYN) IVPB 3.375 g  Status:  Discontinued     3.375 g 12.5 mL/hr over 240 Minutes Intravenous Every 8 hours 12/11/15 0536 12/14/15 1435   12/11/15 0045  piperacillin-tazobactam (ZOSYN) IVPB 3.375 g     3.375 g 100 mL/hr over 30 Minutes Intravenous  Once 12/11/15 0038 12/11/15 0339     Assessment/Plan s/p Procedure(s): ENDOSCOPIC RETROGRADE CHOLANGIOPANCREATOGRAPHY (ERCP) BILIARY STENT PLACEMENT For post-op bile leak s/p lac chole w/ IOC 12/06/15, Dr. Barry Dienes - RUQ drain placed 12/12/15 - culture: viridans streptococcus, started vanc 12/12/15 - second, perihepatic drain placed 12/17/15  - afebrile. CBC pending - nausea: zofran, phenergan  ID: Zosyn 10/3 - 10/5, vanc 10/4-10/6, Rocephin 10/6 >>, Flagyl 10/6 >> FEN: clear liquid diet VTE: SCD's, Re-start lovenox after CBC today  Plan: advance to soft diet, PO pain control, ambulate Monitor drain output.      LOS: 7 days    Jill Alexanders , [facility] 12/18/2015, 8:11 AM Pager: [phone] Consults: [phone] Mon-Fri 7:00 am-4:30 pm Sat-Sun 7:00 am-11:30 am

## 2015-12-18 NOTE — Progress Notes (Signed)
Subjective:  Patient and her "best friend" her daughter in law are worried about possibiltiy of persistent bile leak and asking if there should be a test done to check for that   Antibiotics:  Anti-infectives    Start     Dose/Rate Route Frequency Ordered Stop   12/14/15 1445  cefTRIAXone (ROCEPHIN) 2 g in dextrose 5 % 50 mL IVPB     2 g 100 mL/hr over 30 Minutes Intravenous Every 24 hours 12/14/15 1435     12/14/15 1445  metroNIDAZOLE (FLAGYL) tablet 500 mg     500 mg Oral Every 8 hours 12/14/15 1435     12/12/15 0930  vancomycin (VANCOCIN) IVPB 1000 mg/200 mL premix  Status:  Discontinued     1,000 mg 200 mL/hr over 60 Minutes Intravenous Every 12 hours 12/12/15 0913 12/14/15 1435   12/11/15 0800  piperacillin-tazobactam (ZOSYN) IVPB 3.375 g  Status:  Discontinued     3.375 g 12.5 mL/hr over 240 Minutes Intravenous Every 8 hours 12/11/15 0536 12/14/15 1435   12/11/15 0045  piperacillin-tazobactam (ZOSYN) IVPB 3.375 g     3.375 g 100 mL/hr over 30 Minutes Intravenous  Once 12/11/15 0038 12/11/15 0339      Medications: Scheduled Meds: . cefTRIAXone (ROCEPHIN)  IV  2 g Intravenous Q24H  . docusate sodium  100 mg Oral BID  . enoxaparin (LOVENOX) injection  40 mg Subcutaneous Q24H  . famotidine  20 mg Oral BID  . metroNIDAZOLE  500 mg Oral Q8H  . senna  1 tablet Oral BID   Continuous Infusions: . lactated ringers with KCl 20 mEq/L 125 mL/hr at 12/17/15 1430   PRN Meds:.acetaminophen **OR** acetaminophen, bisacodyl, HYDROcodone-acetaminophen, HYDROmorphone (DILAUDID) injection, methocarbamol, ondansetron **OR** ondansetron (ZOFRAN) IV, promethazine, technetium TC 84M mebrofenin    Objective: Weight change:   Intake/Output Summary (Last 24 hours) at 12/18/15 1247 Last data filed at 12/18/15 1159  Gross per 24 hour  Intake           807.17 ml  Output             2000 ml  Net         -1192.83 ml   Blood pressure (!) 143/79, pulse 99, temperature 98.7 F (37.1  C), temperature source Oral, resp. rate 18, height 5\' 7"  (1.702 m), weight 166 lb (75.3 kg), SpO2 96 %. Temp:  [98.7 F (37.1 C)] 98.7 F (37.1 C) (10/09 1316) Pulse Rate:  [99-104] 99 (10/09 1316) Resp:  [18-35] 18 (10/09 1316) BP: (142-153)/(77-86) 143/79 (10/09 1316) SpO2:  [94 %-97 %] 96 % (10/09 1316)  Physical Exam: General: Alert and awake, oriented x3, dysphoric HEENT: anicteric sclera,  EOMI, oropharynx clear and without exudate Cardiovascular: tachy rate, normal r,  no murmur rubs or gallops Pulmonary: clear to auscultation bilaterally, no wheezing, rales or rhonchi Gastrointestinal: soft ttp RUQ, +bs, drain with increased bilious  material present, 2nd drain present as well Skin, soft tissue: no rashes Neuro: nonfocal, strength and sensation intact   CBC:  CBC Latest Ref Rng & Units 12/18/2015 12/16/2015 12/15/2015  WBC 4.0 - 10.5 K/uL 10.3 15.4(H) 11.7(H)  Hemoglobin 12.0 - 15.0 g/dL 10.9(L) 9.9(L) 10.3(L)  Hematocrit 36.0 - 46.0 % 34.5(L) 30.8(L) 31.4(L)  Platelets 150 - 400 K/uL 597(H) 500(H) PLATELET CLUMPS NOTED ON SMEAR, COUNT APPEARS ADEQUATE      BMET  Recent Labs  12/16/15 0241  NA 138  K 4.1  CL 103  CO2 25  GLUCOSE 98  BUN <5*  CREATININE 0.71  CALCIUM 8.2*     Liver Panel   Recent Labs  12/16/15 0241  PROT 5.3*  ALBUMIN 1.8*  AST 45*  ALT 36  ALKPHOS 132*  BILITOT 0.4       Sedimentation Rate No results for input(s): ESRSEDRATE in the last 72 hours. C-Reactive Protein No results for input(s): CRP in the last 72 hours.  Micro Results: Recent Results (from the past 720 hour(s))  Surgical pcr screen     Status: None   Collection Time: 12/05/15  3:35 AM  Result Value Ref Range Status   MRSA, PCR NEGATIVE NEGATIVE Final   Staphylococcus aureus NEGATIVE NEGATIVE Final    Comment:        The Xpert SA Assay (FDA approved for NASAL specimens in patients over 75 years of age), is one component of a comprehensive  surveillance program.  Test performance has been validated by [facility] for patients greater than or equal to 65 year old. It is not intended to diagnose infection nor to guide or monitor treatment.   Culture, blood (Routine x 2)     Status: Abnormal   Collection Time: 12/10/15  7:00 PM  Result Value Ref Range Status   Specimen Description BLOOD LEFT HAND  Final   Special Requests BOTTLES DRAWN AEROBIC ONLY 5CC  Final   Culture  Setup Time   Final    GRAM POSITIVE COCCOBACILLUS AEROBIC BOTTLE ONLY Organism ID to follow CRITICAL RESULT CALLED TO, READ BACK BY AND VERIFIED WITH: J. Frens Pharm.D. 13:00 12/11/15 ([email or web address])    Culture VIRIDANS STREPTOCOCCUS (A)  Final   Report Status 12/17/2015 FINAL  Final   Organism ID, Bacteria VIRIDANS STREPTOCOCCUS  Final      Susceptibility   Viridans streptococcus - MIC*    PENICILLIN 1 INTERMEDIATE Intermediate     CEFTRIAXONE 0.25 SENSITIVE Sensitive     ERYTHROMYCIN >=8 RESISTANT Resistant     LEVOFLOXACIN 2 SENSITIVE Sensitive     VANCOMYCIN 1 SENSITIVE Sensitive     * VIRIDANS STREPTOCOCCUS  Urine culture     Status: None   Collection Time: 12/10/15  7:00 PM  Result Value Ref Range Status   Specimen Description Urine  Final   Special Requests NONE  Final   Culture NO GROWTH  Final   Report Status 12/12/2015 FINAL  Final  Blood Culture ID Panel (Reflexed)     Status: Abnormal   Collection Time: 12/10/15  7:00 PM  Result Value Ref Range Status   Enterococcus species NOT DETECTED NOT DETECTED Final   Listeria monocytogenes NOT DETECTED NOT DETECTED Final   Staphylococcus species NOT DETECTED NOT DETECTED Final   Staphylococcus aureus NOT DETECTED NOT DETECTED Final   Streptococcus species DETECTED (A) NOT DETECTED Final    Comment: CRITICAL RESULT CALLED TO, READ BACK BY AND VERIFIED WITH: J. Frens Pharm.D. 13:00 12/11/15 ([email or web address])    Streptococcus agalactiae NOT DETECTED NOT DETECTED Final   Streptococcus pneumoniae NOT DETECTED  NOT DETECTED Final   Streptococcus pyogenes NOT DETECTED NOT DETECTED Final   Acinetobacter baumannii NOT DETECTED NOT DETECTED Final   Enterobacteriaceae species NOT DETECTED NOT DETECTED Final   Enterobacter cloacae complex NOT DETECTED NOT DETECTED Final   Escherichia coli NOT DETECTED NOT DETECTED Final   Klebsiella oxytoca NOT DETECTED NOT DETECTED Final   Klebsiella pneumoniae NOT DETECTED NOT DETECTED Final   Proteus species NOT DETECTED NOT DETECTED Final   Serratia marcescens NOT DETECTED NOT  DETECTED Final   Haemophilus influenzae NOT DETECTED NOT DETECTED Final   Neisseria meningitidis NOT DETECTED NOT DETECTED Final   Pseudomonas aeruginosa NOT DETECTED NOT DETECTED Final   Candida albicans NOT DETECTED NOT DETECTED Final   Candida glabrata NOT DETECTED NOT DETECTED Final   Candida krusei NOT DETECTED NOT DETECTED Final   Candida parapsilosis NOT DETECTED NOT DETECTED Final   Candida tropicalis NOT DETECTED NOT DETECTED Final  Culture, blood (Routine x 2)     Status: Abnormal   Collection Time: 12/10/15  7:13 PM  Result Value Ref Range Status   Specimen Description BLOOD RIGHT ANTECUBITAL  Final   Special Requests BOTTLES DRAWN AEROBIC ONLY 5CC  Final   Culture  Setup Time   Final    GRAM POSITIVE COCCOBACILLUS AEROBIC BOTTLE ONLY CRITICAL RESULT CALLED TO, READ BACK BY AND VERIFIED WITH: J. Frens Pharm.D. 13:00 12/11/15 ([email or web address])    Culture (A)  Final    VIRIDANS STREPTOCOCCUS SUSCEPTIBILITIES PERFORMED ON PREVIOUS CULTURE WITHIN THE LAST 5 DAYS.    Report Status 12/16/2015 FINAL  Final  MRSA PCR Screening     Status: None   Collection Time: 12/11/15  6:27 AM  Result Value Ref Range Status   MRSA by PCR NEGATIVE NEGATIVE Final    Comment:        The GeneXpert MRSA Assay (FDA approved for NASAL specimens only), is one component of a comprehensive MRSA colonization surveillance program. It is not intended to diagnose MRSA infection nor to guide or monitor  treatment for MRSA infections.   Aerobic/Anaerobic Culture (surgical/deep wound)     Status: None   Collection Time: 12/11/15  1:12 PM  Result Value Ref Range Status   Specimen Description ABDOMEN  Final   Special Requests NONE  Final   Gram Stain   Final    NO WBC SEEN FEW GRAM POSITIVE COCCI IN PAIRS AND CHAINS RARE GRAM NEGATIVE RODS    Culture   Final    MODERATE STREPTOCOCCUS SALIVARIUS NO ANAEROBES ISOLATED    Report Status 12/16/2015 FINAL  Final   Organism ID, Bacteria STREPTOCOCCUS SALIVARIUS  Final      Susceptibility   Streptococcus salivarius - MIC*    PENICILLIN 1 INTERMEDIATE Intermediate     CEFTRIAXONE 0.25 SENSITIVE Sensitive     ERYTHROMYCIN >=8 RESISTANT Resistant     LEVOFLOXACIN 2 SENSITIVE Sensitive     VANCOMYCIN 1 SENSITIVE Sensitive     * MODERATE STREPTOCOCCUS SALIVARIUS  Culture, blood (Routine X 2) w Reflex to ID Panel     Status: None (Preliminary result)   Collection Time: 12/14/15  3:29 PM  Result Value Ref Range Status   Specimen Description BLOOD RIGHT HAND  Final   Special Requests BOTTLES DRAWN AEROBIC ONLY 5CC  Final   Culture NO GROWTH 4 DAYS  Final   Report Status PENDING  Incomplete  Culture, blood (Routine X 2) w Reflex to ID Panel     Status: None (Preliminary result)   Collection Time: 12/14/15  3:33 PM  Result Value Ref Range Status   Specimen Description BLOOD LEFT HAND  Final   Special Requests IN PEDIATRIC BOTTLE 1.5CC  Final   Culture NO GROWTH 4 DAYS  Final   Report Status PENDING  Incomplete    Studies/Results: Ir Sinus/fist Tube Chk-non Gi  Result Date: 12/17/2015 CLINICAL DATA:  Bile leak post cholecystectomy despite endoscopic stent. Pigtail drain catheter was placed in the gallbladder fossa but there is continued output. EXAM: ABDOMINAL  DRAIN CATHETER INJECTION UNDER FLUOROSCOPY TECHNIQUE: The procedure, risks (including but not limited to bleeding, infection, organ damage ), benefits, and alternatives were explained  to the patient. Questions regarding the procedure were encouraged and answered. The patient understands and consents to the procedure. Survey fluoroscopic inspection reveals stable position of the pigtail drain catheter in the gallbladder fossa. Injection demonstrates patency of the drain catheter. There is a small residual cavity. With slight distention, there is some reflux of contrast into the intrahepatic biliary tree. With aspiration, the cavity is completely evacuated. IMPRESSION: 1. Continued biliary leak, draining into the gallbladder fossa, well controlled by the percutaneous drain catheter. Catheter was left in place, returned to external drainage. Electronically Signed   By: Lucrezia Europe M.D.   On: 12/17/2015 16:47   Ir Guided Niel Hummer W Catheter Placement  Result Date: 12/17/2015 CLINICAL DATA:  Bile leak post cholecystectomy despite endoscopic stent placement. Continued output from gallbladder fossa drain. 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 EXAM: ULTRASOUND AND FLUOROSCOPIC GUIDED DRAINAGE OF PERIHEPATIC BILOMA ANESTHESIA/SEDATION: Intravenous Fentanyl and Versed were administered as conscious sedation during continuous monitoring of the patient's level of consciousness and physiological / cardiorespiratory status by the radiology RN, with a total moderate sedation time of 10 minutes. PROCEDURE: The procedure, risks, benefits, and alternatives were explained to the patient. Questions regarding the procedure were encouraged and answered. The patient understands and consents to the procedure. 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 The operative field was prepped with chlorhexidinein a sterile fashion, and a sterile drape was applied covering the operative field. A sterile gown and sterile gloves were used for the procedure. Local anesthesia was provided with 1%  Lidocaine. Under real-time ultrasound guidance, a 21 gauge micropuncture needle was advanced into the collection. Thin yellow-green clear fluid could be aspirated. An 018 guidewire advanced centrally within the collection, its position confirmed on fluoroscopy. This was exchanged using the transitional dilator for short Amplatz wire, and the tract was dilated to facilitate placement of a 12 French pigtail catheter, formed centrally within the collection. Small contrast injection under fluoro confirms appropriate positioning and patency. Catheter secured externally with 0 Prolene suture and StatLock and placed to gravity drain bag. The patient tolerated the procedure well. COMPLICATIONS: None immediate FINDINGS: The right perihepatic subphrenic biloma was again localized. Twelve French pigtail drain catheter placed under ultrasound and fluoroscopic guidance as above. IMPRESSION: 1. Technically successful perihepatic subphrenic biloma drain catheter placement under ultrasound and fluoroscopic guidance. Electronically Signed   By: Lucrezia Europe M.D.   On: 12/17/2015 16:45   Ir US Guide Bx Asp/drain  Result Date: 12/17/2015 CLINICAL DATA:  Bile leak post cholecystectomy despite endoscopic stent placement. Continued output from gallbladder fossa drain. 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 EXAM: ULTRASOUND AND FLUOROSCOPIC GUIDED DRAINAGE OF PERIHEPATIC BILOMA ANESTHESIA/SEDATION: Intravenous Fentanyl and Versed were administered as conscious sedation during continuous monitoring of the patient's level of consciousness and physiological / cardiorespiratory status by the radiology RN, with a total moderate sedation time of 10 minutes. PROCEDURE: The procedure, risks, benefits, and alternatives were explained to the patient. Questions regarding the procedure were encouraged and answered. The patient understands and consents to the procedure. Under ultrasound guidance, the right  perihepatic subphrenic biloma was localized and an appropriate skin entry site determined and marked. The operative field was prepped with chlorhexidinein a sterile fashion, and a sterile drape was  applied covering the operative field. A sterile gown and sterile gloves were used for the procedure. Local anesthesia was provided with 1% Lidocaine. Under real-time ultrasound guidance, a 21 gauge micropuncture needle was advanced into the collection. Thin yellow-green clear fluid could be aspirated. An 018 guidewire advanced centrally within the collection, its position confirmed on fluoroscopy. This was exchanged using the transitional dilator for short Amplatz wire, and the tract was dilated to facilitate placement of a 12 French pigtail catheter, formed centrally within the collection. Small contrast injection under fluoro confirms appropriate positioning and patency. Catheter secured externally with 0 Prolene suture and StatLock and placed to gravity drain bag. The patient tolerated the procedure well. COMPLICATIONS: None immediate FINDINGS: The right perihepatic subphrenic biloma was again localized. Twelve French pigtail drain catheter placed under ultrasound and fluoroscopic guidance as above. IMPRESSION: 1. Technically successful perihepatic subphrenic biloma drain catheter placement under ultrasound and fluoroscopic guidance. Electronically Signed   By: Lucrezia Europe M.D.   On: 12/17/2015 16:45      Assessment/Plan:  INTERVAL HISTORY: patient with worsening nausea and vomiting  2nd drain placed  Active Problems:   Sepsis ([place])   Bile leak, postoperative   Biloma   Biloma following surgery   Status post cholecystectomy   Infection by Streptococcus, viridans group   Routine screening for STI (sexually transmitted infection)   Bacteremia    ZANYIAH CRUS is a 65 y.o. female with  Recent cholecystectomy with postoperative subjective fevers and low grade temperatures readmitted with fevers,  confusion and found now to be bacteremic with Viridans group streptococci and with bile leak and intrabdominal infected bilious abscess sp IR drainage  #1 Viridans group streptococci: per micro the organism was I to PCN with MIC =1   Given predilection for heart valves and given that her malaise symptoms could have been more than a few days would favor checking TEE to ensure no endocarditis  MAIN REASON NOW BEING THAT IF SHE HAD ENDOCARDITIS WITH VIRIDANS WITH HIGHER MIC TO PCN Guidelines would be for high dose PCN AND gentamicin together for 4 weeks  --repeat blood cultures NGTD  - continue rocephin IV and flagyl po  #2 Infected bilious fluid colleciton: growing S. Salivarious, 2 drains in place  #3 ? Bile leak: pt and friend very worried about this. I said I would pass on to her CCS and GI teams.   LOS: 7 days   Alcide Evener 12/18/2015, 12:47 PM

## 2015-12-19 ENCOUNTER — Encounter (HOSPITAL_COMMUNITY): Payer: Self-pay

## 2015-12-19 ENCOUNTER — Encounter (HOSPITAL_COMMUNITY): Admission: EM | Disposition: A | Discharge status: Home Health | Payer: Self-pay | Source: Home / Self Care

## 2015-12-19 ENCOUNTER — Inpatient Hospital Stay (HOSPITAL_COMMUNITY): Payer: BLUE CROSS/BLUE SHIELD

## 2015-12-19 DIAGNOSIS — I340 Nonrheumatic mitral (valve) insufficiency: Secondary | ICD-10-CM

## 2015-12-19 HISTORY — PX: TEE WITHOUT CARDIOVERSION: SHX5443

## 2015-12-19 LAB — CULTURE, BLOOD (ROUTINE X 2)
Culture: NO GROWTH
Culture: NO GROWTH

## 2015-12-19 LAB — COMPREHENSIVE METABOLIC PANEL
ALT: 32 U/L (ref 14–54)
AST: 50 U/L — ABNORMAL HIGH (ref 15–41)
Albumin: 2.1 g/dL — ABNORMAL LOW (ref 3.5–5.0)
Alkaline Phosphatase: 207 U/L — ABNORMAL HIGH (ref 38–126)
Anion gap: 10 (ref 5–15)
BUN: <5 mg/dL — ABNORMAL LOW (ref 6–20)
CO2: 23 mmol/L (ref 22–32)
Calcium: 8.3 mg/dL — ABNORMAL LOW (ref 8.9–10.3)
Chloride: 106 mmol/L (ref 101–111)
Creatinine, Ser: 0.78 mg/dL (ref 0.44–1.00)
GFR calc Af Amer: >60 mL/min (ref >60)
GFR calc non Af Amer: >60 mL/min (ref >60)
Glucose, Bld: 115 mg/dL — ABNORMAL HIGH (ref 65–99)
Potassium: 4.3 mmol/L (ref 3.5–5.1)
Sodium: 139 mmol/L (ref 135–145)
Total Bilirubin: 0.3 mg/dL (ref 0.3–1.2)
Total Protein: 5.6 g/dL — ABNORMAL LOW (ref 6.5–8.1)

## 2015-12-19 LAB — CBC
HCT: 34.2 % — ABNORMAL LOW (ref 36.0–46.0)
Hemoglobin: 10.8 g/dL — ABNORMAL LOW (ref 12.0–15.0)
MCH: 28.2 pg (ref 26.0–34.0)
MCHC: 31.6 g/dL (ref 30.0–36.0)
MCV: 89.3 fL (ref 78.0–100.0)
Platelets: 640 10*3/uL — ABNORMAL HIGH (ref 150–400)
RBC: 3.83 MIL/uL — ABNORMAL LOW (ref 3.87–5.11)
RDW: 15.9 % — ABNORMAL HIGH (ref 11.5–15.5)
WBC: 12.1 10*3/uL — ABNORMAL HIGH (ref 4.0–10.5)

## 2015-12-19 SURGERY — ECHOCARDIOGRAM, TRANSESOPHAGEAL
Anesthesia: Moderate Sedation

## 2015-12-19 MED ORDER — FENTANYL CITRATE (PF) 100 MCG/2ML IJ SOLN
INTRAMUSCULAR | Status: DC | PRN
  Administered 2015-12-19 (×4): 25 ug via INTRAVENOUS

## 2015-12-19 MED ORDER — MIDAZOLAM HCL 5 MG/ML IJ SOLN
INTRAMUSCULAR | Status: AC
  Filled 2015-12-19: qty 2

## 2015-12-19 MED ORDER — BOOST / RESOURCE BREEZE PO LIQD: 1.0000 | Freq: Two times a day (BID) | ORAL | Status: DC

## 2015-12-19 MED ORDER — MIDAZOLAM HCL 10 MG/2ML IJ SOLN
INTRAMUSCULAR | Status: DC | PRN
  Administered 2015-12-19: 1 mg via INTRAVENOUS
  Administered 2015-12-19 (×2): 2 mg via INTRAVENOUS

## 2015-12-19 MED ORDER — SACCHAROMYCES BOULARDII 250 MG PO CAPS
250.0000 mg | ORAL_CAPSULE | Freq: Two times a day (BID) | ORAL | Status: DC
  Administered 2015-12-19 – 2015-12-28 (×18): 250 mg via ORAL
  Filled 2015-12-19 (×18): qty 1

## 2015-12-19 MED ORDER — FENTANYL CITRATE (PF) 100 MCG/2ML IJ SOLN
INTRAMUSCULAR | Status: AC
  Filled 2015-12-19: qty 2

## 2015-12-19 MED ORDER — BUTAMBEN-TETRACAINE-BENZOCAINE 2-2-14 % EX AERO
INHALATION_SPRAY | CUTANEOUS | Status: DC | PRN
  Administered 2015-12-19: 2 via TOPICAL

## 2015-12-19 MED ORDER — SODIUM CHLORIDE 0.9 % IV SOLN: INTRAVENOUS | Status: DC

## 2015-12-19 MED ORDER — OCTREOTIDE ACETATE 100 MCG/ML IJ SOLN
100.0000 ug | Freq: Three times a day (TID) | INTRAMUSCULAR | Status: DC
  Administered 2015-12-19 – 2015-12-24 (×16): 100 ug via INTRAVENOUS
  Filled 2015-12-19 (×18): qty 1

## 2015-12-19 MED ORDER — FUROSEMIDE 20 MG PO TABS
20.0000 mg | ORAL_TABLET | Freq: Once | ORAL | Status: AC
  Administered 2015-12-19: 20 mg via ORAL
  Filled 2015-12-19: qty 1

## 2015-12-19 NOTE — CV Procedure (Signed)
During this procedure the patient is administered a total of Versed 5 mg and Fentanyl 100 mg to achieve and maintain moderate conscious sedation.  The patient's heart rate, blood pressure, and oxygen saturation are monitored continuously during the procedure. The period of conscious sedation is 33 minutes, of which I was present face-to-face 100% of this time.  MIld MR MIld AR Normal eF 60% Normal TV/PV No evidence of SBE  See full report in [employer]

## 2015-12-19 NOTE — Progress Notes (Signed)
[facility] Progress Note  2 Days Post-Op  Subjective: Abdominal soreness improving. Increased appetite yesterday and tolerated soft diet without nausea/vomiting. +flatus and BMs. Does report night sweats. Denies vomiting.   Drain output continuing to increase: bilious  Drain 1: 2,775 mL (new drain, placed 10/9) Drain 2: 20 mL  Objective: Vital signs in last 24 hours: Temp:  [97.6 F (36.4 C)-99.2 F (37.3 C)] 98.8 F (37.1 C) (10/11 0900) Pulse Rate:  [88-109] 94 (10/11 0900) Resp:  [18-19] 18 (10/11 0900) BP: (129-149)/(73-86) 138/86 (10/11 0900) SpO2:  [95 %-100 %] 100 % (10/11 0900) Last BM Date: 12/18/15  Intake/Output from previous day: 10/10 0701 - 10/11 0700 In: 2830.4 [P.O.:840; I.V.:1980.4] Out: 2795 [Drains:2795] Intake/Output this shift: Total I/O In: -  Out: 150 [Drains:150]  PE: Gen:  Alert, NAD, pleasant Card:  RRR, no M/G/R heard Pulm:  CTA, no W/R/R Abd: Soft, appropriately tender around drain sites, ND, +BS, drain sites and incisions c/d/i   Lab Results:   Recent Labs  12/18/15 0849 12/19/15 0339  WBC 10.3 12.1*  HGB 10.9* 10.8*  HCT 34.5* 34.2*  PLT 597* 640*   BMET  Recent Labs  12/19/15 0339  NA 139  K 4.3  CL 106  CO2 23  GLUCOSE 115*  BUN <5*  CREATININE 0.78  CALCIUM 8.3*   PT/INR  Recent Labs  12/17/15 0447  LABPROT 17.5*  INR 1.42   CMP     Component Value Date/Time   NA 139 12/19/2015 0339   K 4.3 12/19/2015 0339   CL 106 12/19/2015 0339   CO2 23 12/19/2015 0339   GLUCOSE 115 (H) 12/19/2015 0339   BUN <5 (L) 12/19/2015 0339   CREATININE 0.78 12/19/2015 0339   CREATININE 1.17 (H) 07/13/2015 1056   CALCIUM 8.3 (L) 12/19/2015 0339   PROT 5.6 (L) 12/19/2015 0339   ALBUMIN 2.1 (L) 12/19/2015 0339   AST 50 (H) 12/19/2015 0339   ALT 32 12/19/2015 0339   ALKPHOS 207 (H) 12/19/2015 0339   BILITOT 0.3 12/19/2015 0339   GFRNONAA >60 12/19/2015 0339   GFRNONAA 50 (L) 07/13/2015 1056   GFRAA >60  12/19/2015 0339   GFRAA 57 (L) 07/13/2015 1056   Lipase     Component Value Date/Time   LIPASE 47 12/16/2015 0241   Studies/Results: Ir Sinus/fist Tube Chk-non Gi  Result Date: 12/17/2015 CLINICAL DATA:  Bile leak post cholecystectomy despite endoscopic stent. Pigtail drain catheter was placed in the gallbladder fossa but there is continued output. EXAM: ABDOMINAL DRAIN CATHETER INJECTION UNDER FLUOROSCOPY TECHNIQUE: The procedure, risks (including but not limited to bleeding, infection, organ damage ), benefits, and alternatives were explained to the patient. Questions regarding the procedure were encouraged and answered. The patient understands and consents to the procedure. Survey fluoroscopic inspection reveals stable position of the pigtail drain catheter in the gallbladder fossa. Injection demonstrates patency of the drain catheter. There is a small residual cavity. With slight distention, there is some reflux of contrast into the intrahepatic biliary tree. With aspiration, the cavity is completely evacuated. IMPRESSION: 1. Continued biliary leak, draining into the gallbladder fossa, well controlled by the percutaneous drain catheter. Catheter was left in place, returned to external drainage. Electronically Signed   By: Lucrezia Europe M.D.   On: 12/17/2015 16:47   Ir Guided Niel Hummer W Catheter Placement  Result Date: 12/17/2015 CLINICAL DATA:  Bile leak post cholecystectomy despite endoscopic stent placement. Continued output from gallbladder fossa drain. 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 EXAM: ULTRASOUND AND FLUOROSCOPIC GUIDED DRAINAGE OF PERIHEPATIC BILOMA ANESTHESIA/SEDATION: Intravenous Fentanyl and Versed were administered as conscious sedation during continuous monitoring of the patient's level of consciousness and physiological / cardiorespiratory status by the radiology RN, with a total moderate sedation time of 10 minutes. PROCEDURE: The procedure,  risks, benefits, and alternatives were explained to the patient. Questions regarding the procedure were encouraged and answered. The patient understands and consents to the procedure. 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 The operative field was prepped with chlorhexidinein a sterile fashion, and a sterile drape was applied covering the operative field. A sterile gown and sterile gloves were used for the procedure. Local anesthesia was provided with 1% Lidocaine. Under real-time ultrasound guidance, a 21 gauge micropuncture needle was advanced into the collection. Thin yellow-green clear fluid could be aspirated. An 018 guidewire advanced centrally within the collection, its position confirmed on fluoroscopy. This was exchanged using the transitional dilator for short Amplatz wire, and the tract was dilated to facilitate placement of a 12 French pigtail catheter, formed centrally within the collection. Small contrast injection under fluoro confirms appropriate positioning and patency. Catheter secured externally with 0 Prolene suture and StatLock and placed to gravity drain bag. The patient tolerated the procedure well. COMPLICATIONS: None immediate FINDINGS: The right perihepatic subphrenic biloma was again localized. Twelve French pigtail drain catheter placed under ultrasound and fluoroscopic guidance as above. IMPRESSION: 1. Technically successful perihepatic subphrenic biloma drain catheter placement under ultrasound and fluoroscopic guidance. Electronically Signed   By: Lucrezia Europe M.D.   On: 12/17/2015 16:45   Ir US Guide Bx Asp/drain  Result Date: 12/17/2015 CLINICAL DATA:  Bile leak post cholecystectomy despite endoscopic stent placement. Continued output from gallbladder fossa drain. 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 EXAM: ULTRASOUND AND FLUOROSCOPIC GUIDED  DRAINAGE OF PERIHEPATIC BILOMA ANESTHESIA/SEDATION: Intravenous Fentanyl and Versed were administered as conscious sedation during continuous monitoring of the patient's level of consciousness and physiological / cardiorespiratory status by the radiology RN, with a total moderate sedation time of 10 minutes. PROCEDURE: The procedure, risks, benefits, and alternatives were explained to the patient. Questions regarding the procedure were encouraged and answered. The patient understands and consents to the procedure. 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 The operative field was prepped with chlorhexidinein a sterile fashion, and a sterile drape was applied covering the operative field. A sterile gown and sterile gloves were used for the procedure. Local anesthesia was provided with 1% Lidocaine. Under real-time ultrasound guidance, a 21 gauge micropuncture needle was advanced into the collection. Thin yellow-green clear fluid could be aspirated. An 018 guidewire advanced centrally within the collection, its position confirmed on fluoroscopy. This was exchanged using the transitional dilator for short Amplatz wire, and the tract was dilated to facilitate placement of a 12 French pigtail catheter, formed centrally within the collection. Small contrast injection under fluoro confirms appropriate positioning and patency. Catheter secured externally with 0 Prolene suture and StatLock and placed to gravity drain bag. The patient tolerated the procedure well. COMPLICATIONS: None immediate FINDINGS: The right perihepatic subphrenic biloma was again localized. Twelve French pigtail drain catheter placed under ultrasound and fluoroscopic guidance as above. IMPRESSION: 1. Technically successful perihepatic subphrenic biloma drain catheter placement under ultrasound and fluoroscopic guidance. Electronically Signed   By: Eden Emms.D.  On: 12/17/2015 16:45    Anti-infectives: Anti-infectives    Start     Dose/Rate Route Frequency Ordered Stop   12/14/15 1445  cefTRIAXone (ROCEPHIN) 2 g in dextrose 5 % 50 mL IVPB     2 g 100 mL/hr over 30 Minutes Intravenous Every 24 hours 12/14/15 1435     12/14/15 1445  metroNIDAZOLE (FLAGYL) tablet 500 mg     500 mg Oral Every 8 hours 12/14/15 1435     12/12/15 0930  vancomycin (VANCOCIN) IVPB 1000 mg/200 mL premix  Status:  Discontinued     1,000 mg 200 mL/hr over 60 Minutes Intravenous Every 12 hours 12/12/15 0913 12/14/15 1435   12/11/15 0800  piperacillin-tazobactam (ZOSYN) IVPB 3.375 g  Status:  Discontinued     3.375 g 12.5 mL/hr over 240 Minutes Intravenous Every 8 hours 12/11/15 0536 12/14/15 1435   12/11/15 0045  piperacillin-tazobactam (ZOSYN) IVPB 3.375 g     3.375 g 100 mL/hr over 30 Minutes Intravenous  Once 12/11/15 0038 12/11/15 0802     Assessment/Plan /p Procedure(s): ENDOSCOPIC RETROGRADE CHOLANGIOPANCREATOGRAPHY (ERCP) BILIARY STENT PLACEMENT For post-op bile leak s/p lac chole w/ IOC 12/06/15, Dr. Barry Dienes - RUQ drain placed 12/12/15 - culture: viridans streptococcus, started vanc 12/12/15 - second, perihepatic drain placed 12/17/15  - afebrile. CBC pending - nausea: zofran, phenergan - 10/11 small bump in AST (50) and alk phos (207); repeat CMET in AM. - 10/11 bump in WBC to 12.1 from 10.3   FEN: clear liquid diet ID: Zosyn 10/3 - 10/5, vanc 10/4-10/6, Rocephin 10/6 >>, Flagyl 10/6 >> VTE: SCD's, Re-start lovenox after CBC today  Plan: Add octreotide to help decrease biliary secretions. Follow drain output. Follow results of TEE. Increased drain output, leukocytosis, and mild bump in LFTs, will discuss possibility surgical intervention with MD.    LOS: 8 days    Jill Alexanders , [facility] 12/19/2015, 10:11 AM Pager: [phone] Consults: [phone] Mon-Fri 7:00 am-4:30 pm Sat-Sun 7:00 am-11:30 am

## 2015-12-19 NOTE — Progress Notes (Signed)
Subjective:  Patient feels better though still sig drain output   Antibiotics:  Anti-infectives    Start     Dose/Rate Route Frequency Ordered Stop   12/14/15 1445  cefTRIAXone (ROCEPHIN) 2 g in dextrose 5 % 50 mL IVPB     2 g 100 mL/hr over 30 Minutes Intravenous Every 24 hours 12/14/15 1435     12/14/15 1445  metroNIDAZOLE (FLAGYL) tablet 500 mg     500 mg Oral Every 8 hours 12/14/15 1435     12/12/15 0930  vancomycin (VANCOCIN) IVPB 1000 mg/200 mL premix  Status:  Discontinued     1,000 mg 200 mL/hr over 60 Minutes Intravenous Every 12 hours 12/12/15 0913 12/14/15 1435   12/11/15 0800  piperacillin-tazobactam (ZOSYN) IVPB 3.375 g  Status:  Discontinued     3.375 g 12.5 mL/hr over 240 Minutes Intravenous Every 8 hours 12/11/15 0536 12/14/15 1435   12/11/15 0045  piperacillin-tazobactam (ZOSYN) IVPB 3.375 g     3.375 g 100 mL/hr over 30 Minutes Intravenous  Once 12/11/15 0038 12/11/15 0339      Medications: Scheduled Meds: . cefTRIAXone (ROCEPHIN)  IV  2 g Intravenous Q24H  . docusate sodium  100 mg Oral BID  . enoxaparin (LOVENOX) injection  40 mg Subcutaneous Q24H  . famotidine  20 mg Oral BID  . metroNIDAZOLE  500 mg Oral Q8H  . octreotide  100 mcg Intravenous TID  . senna  1 tablet Oral BID   Continuous Infusions: . lactated ringers with KCl 20 mEq/L 75 mL/hr at 12/19/15 0531   PRN Meds:.acetaminophen **OR** acetaminophen, bisacodyl, HYDROcodone-acetaminophen, HYDROmorphone (DILAUDID) injection, methocarbamol, ondansetron **OR** ondansetron (ZOFRAN) IV, promethazine, technetium TC 59M mebrofenin    Objective: Weight change:   Intake/Output Summary (Last 24 hours) at 12/19/15 1301 Last data filed at 12/19/15 1200  Gross per 24 hour  Intake          2085.42 ml  Output             2420 ml  Net          -334.58 ml   Blood pressure 138/86, pulse 94, temperature 98.8 F (37.1 C), temperature source Oral, resp. rate 18, height 5\' 7"  (1.702 m), weight  166 lb (75.3 kg), SpO2 100 %. Temp:  [97.6 F (36.4 C)-99.2 F (37.3 C)] 98.8 F (37.1 C) (10/11 0900) Pulse Rate:  [88-109] 94 (10/11 0900) Resp:  [18-19] 18 (10/11 0900) BP: (129-149)/(73-86) 138/86 (10/11 0900) SpO2:  [95 %-100 %] 100 % (10/11 0900)  Physical Exam: General: Alert and awake, oriented x3, dysphoric HEENT: anicteric sclera,  EOMI, oropharynx clear and without exudate Cardiovascular: tachy rate, normal r,  no murmur rubs or gallops Pulmonary: clear to auscultation bilaterally, no wheezing, rales or rhonchi Gastrointestinal: soft ttp RUQ, +bs, drain with increased bilious  material present, 2nd drain present as well Skin, soft tissue: no rashes Neuro: nonfocal, strength and sensation intact   CBC:  CBC Latest Ref Rng & Units 12/19/2015 12/18/2015 12/16/2015  WBC 4.0 - 10.5 K/uL 12.1(H) 10.3 15.4(H)  Hemoglobin 12.0 - 15.0 g/dL 10.8(L) 10.9(L) 9.9(L)  Hematocrit 36.0 - 46.0 % 34.2(L) 34.5(L) 30.8(L)  Platelets 150 - 400 K/uL 640(H) 597(H) 500(H)      BMET  Recent Labs  12/19/15 0339  NA 139  K 4.3  CL 106  CO2 23  GLUCOSE 115*  BUN <5*  CREATININE 0.78  CALCIUM 8.3*     Liver Panel  Recent Labs  12/19/15 0339  PROT 5.6*  ALBUMIN 2.1*  AST 50*  ALT 32  ALKPHOS 207*  BILITOT 0.3       Sedimentation Rate No results for input(s): ESRSEDRATE in the last 72 hours. C-Reactive Protein No results for input(s): CRP in the last 72 hours.  Micro Results: Recent Results (from the past 720 hour(s))  Surgical pcr screen     Status: None   Collection Time: 12/05/15  3:35 AM  Result Value Ref Range Status   MRSA, PCR NEGATIVE NEGATIVE Final   Staphylococcus aureus NEGATIVE NEGATIVE Final    Comment:        The Xpert SA Assay (FDA approved for NASAL specimens in patients over 75 years of age), is one component of a comprehensive surveillance program.  Test performance has been validated by [facility] for patients greater than or equal  to 23 year old. It is not intended to diagnose infection nor to guide or monitor treatment.   Culture, blood (Routine x 2)     Status: Abnormal   Collection Time: 12/10/15  7:00 PM  Result Value Ref Range Status   Specimen Description BLOOD LEFT HAND  Final   Special Requests BOTTLES DRAWN AEROBIC ONLY 5CC  Final   Culture  Setup Time   Final    GRAM POSITIVE COCCOBACILLUS AEROBIC BOTTLE ONLY Organism ID to follow CRITICAL RESULT CALLED TO, READ BACK BY AND VERIFIED WITH: J. Frens Pharm.D. 13:00 12/11/15 ([email or web address])    Culture VIRIDANS STREPTOCOCCUS (A)  Final   Report Status 12/17/2015 FINAL  Final   Organism ID, Bacteria VIRIDANS STREPTOCOCCUS  Final      Susceptibility   Viridans streptococcus - MIC*    PENICILLIN 1 INTERMEDIATE Intermediate     CEFTRIAXONE 0.25 SENSITIVE Sensitive     ERYTHROMYCIN >=8 RESISTANT Resistant     LEVOFLOXACIN 2 SENSITIVE Sensitive     VANCOMYCIN 1 SENSITIVE Sensitive     * VIRIDANS STREPTOCOCCUS  Urine culture     Status: None   Collection Time: 12/10/15  7:00 PM  Result Value Ref Range Status   Specimen Description Urine  Final   Special Requests NONE  Final   Culture NO GROWTH  Final   Report Status 12/12/2015 FINAL  Final  Blood Culture ID Panel (Reflexed)     Status: Abnormal   Collection Time: 12/10/15  7:00 PM  Result Value Ref Range Status   Enterococcus species NOT DETECTED NOT DETECTED Final   Listeria monocytogenes NOT DETECTED NOT DETECTED Final   Staphylococcus species NOT DETECTED NOT DETECTED Final   Staphylococcus aureus NOT DETECTED NOT DETECTED Final   Streptococcus species DETECTED (A) NOT DETECTED Final    Comment: CRITICAL RESULT CALLED TO, READ BACK BY AND VERIFIED WITH: J. Frens Pharm.D. 13:00 12/11/15 ([email or web address])    Streptococcus agalactiae NOT DETECTED NOT DETECTED Final   Streptococcus pneumoniae NOT DETECTED NOT DETECTED Final   Streptococcus pyogenes NOT DETECTED NOT DETECTED Final   Acinetobacter baumannii NOT  DETECTED NOT DETECTED Final   Enterobacteriaceae species NOT DETECTED NOT DETECTED Final   Enterobacter cloacae complex NOT DETECTED NOT DETECTED Final   Escherichia coli NOT DETECTED NOT DETECTED Final   Klebsiella oxytoca NOT DETECTED NOT DETECTED Final   Klebsiella pneumoniae NOT DETECTED NOT DETECTED Final   Proteus species NOT DETECTED NOT DETECTED Final   Serratia marcescens NOT DETECTED NOT DETECTED Final   Haemophilus influenzae NOT DETECTED NOT DETECTED Final   Neisseria meningitidis NOT DETECTED NOT DETECTED  Final   Pseudomonas aeruginosa NOT DETECTED NOT DETECTED Final   Candida albicans NOT DETECTED NOT DETECTED Final   Candida glabrata NOT DETECTED NOT DETECTED Final   Candida krusei NOT DETECTED NOT DETECTED Final   Candida parapsilosis NOT DETECTED NOT DETECTED Final   Candida tropicalis NOT DETECTED NOT DETECTED Final  Culture, blood (Routine x 2)     Status: Abnormal   Collection Time: 12/10/15  7:13 PM  Result Value Ref Range Status   Specimen Description BLOOD RIGHT ANTECUBITAL  Final   Special Requests BOTTLES DRAWN AEROBIC ONLY 5CC  Final   Culture  Setup Time   Final    GRAM POSITIVE COCCOBACILLUS AEROBIC BOTTLE ONLY CRITICAL RESULT CALLED TO, READ BACK BY AND VERIFIED WITH: J. Frens Pharm.D. 13:00 12/11/15 ([email or web address])    Culture (A)  Final    VIRIDANS STREPTOCOCCUS SUSCEPTIBILITIES PERFORMED ON PREVIOUS CULTURE WITHIN THE LAST 5 DAYS.    Report Status 12/16/2015 FINAL  Final  MRSA PCR Screening     Status: None   Collection Time: 12/11/15  6:27 AM  Result Value Ref Range Status   MRSA by PCR NEGATIVE NEGATIVE Final    Comment:        The GeneXpert MRSA Assay (FDA approved for NASAL specimens only), is one component of a comprehensive MRSA colonization surveillance program. It is not intended to diagnose MRSA infection nor to guide or monitor treatment for MRSA infections.   Aerobic/Anaerobic Culture (surgical/deep wound)     Status: None   Collection  Time: 12/11/15  1:12 PM  Result Value Ref Range Status   Specimen Description ABDOMEN  Final   Special Requests NONE  Final   Gram Stain   Final    NO WBC SEEN FEW GRAM POSITIVE COCCI IN PAIRS AND CHAINS RARE GRAM NEGATIVE RODS    Culture   Final    MODERATE STREPTOCOCCUS SALIVARIUS NO ANAEROBES ISOLATED    Report Status 12/16/2015 FINAL  Final   Organism ID, Bacteria STREPTOCOCCUS SALIVARIUS  Final      Susceptibility   Streptococcus salivarius - MIC*    PENICILLIN 1 INTERMEDIATE Intermediate     CEFTRIAXONE 0.25 SENSITIVE Sensitive     ERYTHROMYCIN >=8 RESISTANT Resistant     LEVOFLOXACIN 2 SENSITIVE Sensitive     VANCOMYCIN 1 SENSITIVE Sensitive     * MODERATE STREPTOCOCCUS SALIVARIUS  Culture, blood (Routine X 2) w Reflex to ID Panel     Status: None (Preliminary result)   Collection Time: 12/14/15  3:29 PM  Result Value Ref Range Status   Specimen Description BLOOD RIGHT HAND  Final   Special Requests BOTTLES DRAWN AEROBIC ONLY 5CC  Final   Culture NO GROWTH 4 DAYS  Final   Report Status PENDING  Incomplete  Culture, blood (Routine X 2) w Reflex to ID Panel     Status: None (Preliminary result)   Collection Time: 12/14/15  3:33 PM  Result Value Ref Range Status   Specimen Description BLOOD LEFT HAND  Final   Special Requests IN PEDIATRIC BOTTLE 1.5CC  Final   Culture NO GROWTH 4 DAYS  Final   Report Status PENDING  Incomplete    Studies/Results: Ir Sinus/fist Tube Chk-non Gi  Result Date: 12/17/2015 CLINICAL DATA:  Bile leak post cholecystectomy despite endoscopic stent. Pigtail drain catheter was placed in the gallbladder fossa but there is continued output. EXAM: ABDOMINAL DRAIN CATHETER INJECTION UNDER FLUOROSCOPY TECHNIQUE: The procedure, risks (including but not limited to bleeding, infection, organ damage ),  benefits, and alternatives were explained to the patient. Questions regarding the procedure were encouraged and answered. The patient understands and consents  to the procedure. Survey fluoroscopic inspection reveals stable position of the pigtail drain catheter in the gallbladder fossa. Injection demonstrates patency of the drain catheter. There is a small residual cavity. With slight distention, there is some reflux of contrast into the intrahepatic biliary tree. With aspiration, the cavity is completely evacuated. IMPRESSION: 1. Continued biliary leak, draining into the gallbladder fossa, well controlled by the percutaneous drain catheter. Catheter was left in place, returned to external drainage. Electronically Signed   By: Lucrezia Europe M.D.   On: 12/17/2015 16:47   Ir Guided Niel Hummer W Catheter Placement  Result Date: 12/17/2015 CLINICAL DATA:  Bile leak post cholecystectomy despite endoscopic stent placement. Continued output from gallbladder fossa drain. 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 EXAM: ULTRASOUND AND FLUOROSCOPIC GUIDED DRAINAGE OF PERIHEPATIC BILOMA ANESTHESIA/SEDATION: Intravenous Fentanyl and Versed were administered as conscious sedation during continuous monitoring of the patient's level of consciousness and physiological / cardiorespiratory status by the radiology RN, with a total moderate sedation time of 10 minutes. PROCEDURE: The procedure, risks, benefits, and alternatives were explained to the patient. Questions regarding the procedure were encouraged and answered. The patient understands and consents to the procedure. 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 The operative field was prepped with chlorhexidinein a sterile fashion, and a sterile drape was applied covering the operative field. A sterile gown and sterile gloves were used for the procedure. Local anesthesia was provided with 1% Lidocaine. Under real-time ultrasound guidance, a 21 gauge micropuncture needle was advanced into the collection. Thin  yellow-green clear fluid could be aspirated. An 018 guidewire advanced centrally within the collection, its position confirmed on fluoroscopy. This was exchanged using the transitional dilator for short Amplatz wire, and the tract was dilated to facilitate placement of a 12 French pigtail catheter, formed centrally within the collection. Small contrast injection under fluoro confirms appropriate positioning and patency. Catheter secured externally with 0 Prolene suture and StatLock and placed to gravity drain bag. The patient tolerated the procedure well. COMPLICATIONS: None immediate FINDINGS: The right perihepatic subphrenic biloma was again localized. Twelve French pigtail drain catheter placed under ultrasound and fluoroscopic guidance as above. IMPRESSION: 1. Technically successful perihepatic subphrenic biloma drain catheter placement under ultrasound and fluoroscopic guidance. Electronically Signed   By: Lucrezia Europe M.D.   On: 12/17/2015 16:45   Ir US Guide Bx Asp/drain  Result Date: 12/17/2015 CLINICAL DATA:  Bile leak post cholecystectomy despite endoscopic stent placement. Continued output from gallbladder fossa drain. 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 EXAM: ULTRASOUND AND FLUOROSCOPIC GUIDED DRAINAGE OF PERIHEPATIC BILOMA ANESTHESIA/SEDATION: Intravenous Fentanyl and Versed were administered as conscious sedation during continuous monitoring of the patient's level of consciousness and physiological / cardiorespiratory status by the radiology RN, with a total moderate sedation time of 10 minutes. PROCEDURE: The procedure, risks, benefits, and alternatives were explained to the patient. Questions regarding the procedure were encouraged and answered. The patient understands and consents to the procedure. 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 The operative field  was prepped with chlorhexidinein a sterile fashion, and a sterile drape was applied covering the operative field. A sterile gown and sterile gloves were used for the procedure. Local anesthesia was  provided with 1% Lidocaine. Under real-time ultrasound guidance, a 21 gauge micropuncture needle was advanced into the collection. Thin yellow-green clear fluid could be aspirated. An 018 guidewire advanced centrally within the collection, its position confirmed on fluoroscopy. This was exchanged using the transitional dilator for short Amplatz wire, and the tract was dilated to facilitate placement of a 12 French pigtail catheter, formed centrally within the collection. Small contrast injection under fluoro confirms appropriate positioning and patency. Catheter secured externally with 0 Prolene suture and StatLock and placed to gravity drain bag. The patient tolerated the procedure well. COMPLICATIONS: None immediate FINDINGS: The right perihepatic subphrenic biloma was again localized. Twelve French pigtail drain catheter placed under ultrasound and fluoroscopic guidance as above. IMPRESSION: 1. Technically successful perihepatic subphrenic biloma drain catheter placement under ultrasound and fluoroscopic guidance. Electronically Signed   By: Lucrezia Europe M.D.   On: 12/17/2015 16:45      Assessment/Plan:  INTERVAL HISTORY:   Continued drain output, octreotide being started by [facility]  Active Problems:   Sepsis ([place])   Bile leak   Biloma   Biloma following surgery   Status post cholecystectomy   Infection by Streptococcus, viridans group   Routine screening for STI (sexually transmitted infection)   Bacteremia    KAMELAH ZEILINGER is a 65 y.o. female with  Recent cholecystectomy with postoperative subjective fevers and low grade temperatures readmitted with fevers, confusion and found now to be bacteremic with Viridans group streptococci and with bile leak and intrabdominal infected bilious abscess sp IR  drainage  #1 Viridans group streptococci: per micro the organism was I to PCN with MIC =1   Given predilection for heart valves and given that her malaise symptoms could have been more than a few days would favor checking TEE to ensure no endocarditis and understand she is to have this procedure today  MAIN REASON NOW BEING THAT IF SHE HAD ENDOCARDITIS WITH VIRIDANS WITH HIGHER MIC TO PCN Guidelines would be for high dose PCN AND gentamicin together for 4 weeks  --repeat blood cultures NGTD  - continue rocephin IV and flagyl po  #2 Infected bilious fluid colleciton: growing S. Salivarious, 2 drains in place  #3 ? Bile leak: pt and friend very worried about this.octreotide being started    LOS: 8 days   Alcide Evener 12/19/2015, 1:01 PM

## 2015-12-19 NOTE — Progress Notes (Signed)
          Daily Rounding Note  12/19/2015, 10:55 AM  LOS: 8 days     [facility] Attending   I have taken an interval history, reviewed the chart and examined the patient. I agree with the Advanced Practitioner's note, impression and recommendations. I think she is improving overall and though would like to see less drainage do not think a stent change is needed at this time - will continue to follow.  Gatha Mayer, MD, [facility] [phone] (pager) [phone] after 5 PM, weekends and holidays  12/19/2015 2:02 PM     ASSESMENT:   * Post chole bile leak. 10/3 drain placed to biloma.  ERCP 10/4: leak from right intrahepatic branch duct, no cystic duct leak. S/p plastic stent placement to CBD. Repeat CT 10/8: biloma smaller. Stable in size, large 13 x 11 x 5 cm subcapsular fluid collection.  10/9 new biloma drain placed. This continues to put out a significant volume of bilious drainage.  * Strep Viridans septicemia (s to Levaquin, vanc). Strep salivarius in bile (s to rocephin). On Flagyl, Rocephin.  TEE today.    * Coagulopathy. Mild increase PT, normal INR.    * Normocytic anemia, stable.  Thrombocytosis.   *  PCM.     PLAN   *  TEE this afternoon, resume diet after that.  Follow biliary drainage.     Azucena Freed  12/19/2015, 10:55 AM Pager: [phone]   SUBJECTIVE:   Chief complaint: Abdominal pain By mouth intake is much improved. She has eaten 75-100% of the last 2 meals.  Appetite much better, pain much better except when she moves around and disrupts the biliary drain.   Passing flatus, having bowel movements.  NPO as of midnight for TEE that is not until 4 PM today.  Walking in halls with assistance.  Unfortunately drain output is increasing, it is bilious.  Older drain only put out 20 mL skin yesterday, the newer drain, place 10 9, put out 2.7 L yesterday.  Surgery added  SQ octreotide to help decrease biliary secretions.   OBJECTIVE:         Vital signs in last 24 hours:    Temp:  [97.6 F (36.4 C)-99.2 F (37.3 C)] 98.8 F (37.1 C) (10/11 0900) Pulse Rate:  [88-109] 94 (10/11 0900) Resp:  [18-19] 18 (10/11 0900) BP: (129-149)/(73-86) 138/86 (10/11 0900) SpO2:  [95 %-100 %] 100 % (10/11 0900) Last BM Date: 12/18/15 Filed Weights   12/10/15 1837 12/10/15 1847 12/17/15 0725  Weight: 74.8 kg (165 lb) 75.3 kg (166 lb) 75.3 kg (166 lb)   General: looks better.  Comfortable.  Not ill.   Heart: RRR Chest: clear bil Abdomen: soft, active BS.  Tender over bile drain in RUQ.  Scant drainage in older drain, new drain with 3o cc and was emptied just a few hours ago.  Extremities: no CCE Neuro/Psych:  Pleasant, in good spirits.  Calm.   Intake/Output from previous day: 10/10 0701 - 10/11 0700 In: 2830.4 [P.O.:840; I.V.:1980.4] Out: 2795 [Drains:2795]  375 out drains so far today Lab Results: LFT  Recent Labs  12/19/15 0339  PROT 5.6*  ALBUMIN 2.1*  AST 50*  ALT 32  ALKPHOS 207*  BILITOT 0.3

## 2015-12-19 NOTE — Progress Notes (Signed)
*  PRELIMINARY RESULTS* Echocardiogram Echocardiogram Transesophageal has been performed.  Leavy Cella 12/19/2015, 3:44 PM

## 2015-12-19 NOTE — Interval H&P Note (Signed)
History and Physical Interval Note:  12/19/2015 2:33 PM  [name]  has presented today for surgery, with the diagnosis of bacteremia  The various methods of treatment have been discussed with the patient and family. After consideration of risks, benefits and other options for treatment, the patient has consented to  Procedure(s): TRANSESOPHAGEAL ECHOCARDIOGRAM (TEE) (N/A) as a surgical intervention .  The patient's history has been reviewed, patient examined, no change in status, stable for surgery.  I have reviewed the patient's chart and labs.  Questions were answered to the patient's satisfaction.     Jenkins Rouge

## 2015-12-19 NOTE — H&P (View-Only) (Signed)
Subjective:  Patient feels better though still sig drain output   Antibiotics:  Anti-infectives    Start     Dose/Rate Route Frequency Ordered Stop   12/14/15 1445  cefTRIAXone (ROCEPHIN) 2 g in dextrose 5 % 50 mL IVPB     2 g 100 mL/hr over 30 Minutes Intravenous Every 24 hours 12/14/15 1435     12/14/15 1445  metroNIDAZOLE (FLAGYL) tablet 500 mg     500 mg Oral Every 8 hours 12/14/15 1435     12/12/15 0930  vancomycin (VANCOCIN) IVPB 1000 mg/200 mL premix  Status:  Discontinued     1,000 mg 200 mL/hr over 60 Minutes Intravenous Every 12 hours 12/12/15 0913 12/14/15 1435   12/11/15 0800  piperacillin-tazobactam (ZOSYN) IVPB 3.375 g  Status:  Discontinued     3.375 g 12.5 mL/hr over 240 Minutes Intravenous Every 8 hours 12/11/15 0536 12/14/15 1435   12/11/15 0045  piperacillin-tazobactam (ZOSYN) IVPB 3.375 g     3.375 g 100 mL/hr over 30 Minutes Intravenous  Once 12/11/15 0038 12/11/15 0339      Medications: Scheduled Meds: . cefTRIAXone (ROCEPHIN)  IV  2 g Intravenous Q24H  . docusate sodium  100 mg Oral BID  . enoxaparin (LOVENOX) injection  40 mg Subcutaneous Q24H  . famotidine  20 mg Oral BID  . metroNIDAZOLE  500 mg Oral Q8H  . octreotide  100 mcg Intravenous TID  . senna  1 tablet Oral BID   Continuous Infusions: . lactated ringers with KCl 20 mEq/L 75 mL/hr at 12/19/15 0531   PRN Meds:.acetaminophen **OR** acetaminophen, bisacodyl, HYDROcodone-acetaminophen, HYDROmorphone (DILAUDID) injection, methocarbamol, ondansetron **OR** ondansetron (ZOFRAN) IV, promethazine, technetium TC 55M mebrofenin    Objective: Weight change:   Intake/Output Summary (Last 24 hours) at 12/19/15 1301 Last data filed at 12/19/15 1200  Gross per 24 hour  Intake          2085.42 ml  Output             2420 ml  Net          -334.58 ml   Blood pressure 138/86, pulse 94, temperature 98.8 F (37.1 C), temperature source Oral, resp. rate 18, height 5\' 7"  (1.702 m), weight  166 lb (75.3 kg), SpO2 100 %. Temp:  [97.6 F (36.4 C)-99.2 F (37.3 C)] 98.8 F (37.1 C) (10/11 0900) Pulse Rate:  [88-109] 94 (10/11 0900) Resp:  [18-19] 18 (10/11 0900) BP: (129-149)/(73-86) 138/86 (10/11 0900) SpO2:  [95 %-100 %] 100 % (10/11 0900)  Physical Exam: General: Alert and awake, oriented x3, dysphoric HEENT: anicteric sclera,  EOMI, oropharynx clear and without exudate Cardiovascular: tachy rate, normal r,  no murmur rubs or gallops Pulmonary: clear to auscultation bilaterally, no wheezing, rales or rhonchi Gastrointestinal: soft ttp RUQ, +bs, drain with increased bilious  material present, 2nd drain present as well Skin, soft tissue: no rashes Neuro: nonfocal, strength and sensation intact   CBC:  CBC Latest Ref Rng & Units 12/19/2015 12/18/2015 12/16/2015  WBC 4.0 - 10.5 K/uL 12.1(H) 10.3 15.4(H)  Hemoglobin 12.0 - 15.0 g/dL 10.8(L) 10.9(L) 9.9(L)  Hematocrit 36.0 - 46.0 % 34.2(L) 34.5(L) 30.8(L)  Platelets 150 - 400 K/uL 640(H) 597(H) 500(H)      BMET  Recent Labs  12/19/15 0339  NA 139  K 4.3  CL 106  CO2 23  GLUCOSE 115*  BUN <5*  CREATININE 0.78  CALCIUM 8.3*     Liver Panel  Recent Labs  12/19/15 0339  PROT 5.6*  ALBUMIN 2.1*  AST 50*  ALT 32  ALKPHOS 207*  BILITOT 0.3       Sedimentation Rate No results for input(s): ESRSEDRATE in the last 72 hours. C-Reactive Protein No results for input(s): CRP in the last 72 hours.  Micro Results: Recent Results (from the past 720 hour(s))  Surgical pcr screen     Status: None   Collection Time: 12/05/15  3:35 AM  Result Value Ref Range Status   MRSA, PCR NEGATIVE NEGATIVE Final   Staphylococcus aureus NEGATIVE NEGATIVE Final    Comment:        The Xpert SA Assay (FDA approved for NASAL specimens in patients over 1 years of age), is one component of a comprehensive surveillance program.  Test performance has been validated by [facility] for patients greater than or equal  to 51 year old. It is not intended to diagnose infection nor to guide or monitor treatment.   Culture, blood (Routine x 2)     Status: Abnormal   Collection Time: 12/10/15  7:00 PM  Result Value Ref Range Status   Specimen Description BLOOD LEFT HAND  Final   Special Requests BOTTLES DRAWN AEROBIC ONLY 5CC  Final   Culture  Setup Time   Final    GRAM POSITIVE COCCOBACILLUS AEROBIC BOTTLE ONLY Organism ID to follow CRITICAL RESULT CALLED TO, READ BACK BY AND VERIFIED WITH: J. Frens Pharm.D. 13:00 12/11/15 ([email or web address])    Culture VIRIDANS STREPTOCOCCUS (A)  Final   Report Status 12/17/2015 FINAL  Final   Organism ID, Bacteria VIRIDANS STREPTOCOCCUS  Final      Susceptibility   Viridans streptococcus - MIC*    PENICILLIN 1 INTERMEDIATE Intermediate     CEFTRIAXONE 0.25 SENSITIVE Sensitive     ERYTHROMYCIN >=8 RESISTANT Resistant     LEVOFLOXACIN 2 SENSITIVE Sensitive     VANCOMYCIN 1 SENSITIVE Sensitive     * VIRIDANS STREPTOCOCCUS  Urine culture     Status: None   Collection Time: 12/10/15  7:00 PM  Result Value Ref Range Status   Specimen Description Urine  Final   Special Requests NONE  Final   Culture NO GROWTH  Final   Report Status 12/12/2015 FINAL  Final  Blood Culture ID Panel (Reflexed)     Status: Abnormal   Collection Time: 12/10/15  7:00 PM  Result Value Ref Range Status   Enterococcus species NOT DETECTED NOT DETECTED Final   Listeria monocytogenes NOT DETECTED NOT DETECTED Final   Staphylococcus species NOT DETECTED NOT DETECTED Final   Staphylococcus aureus NOT DETECTED NOT DETECTED Final   Streptococcus species DETECTED (A) NOT DETECTED Final    Comment: CRITICAL RESULT CALLED TO, READ BACK BY AND VERIFIED WITH: J. Frens Pharm.D. 13:00 12/11/15 ([email or web address])    Streptococcus agalactiae NOT DETECTED NOT DETECTED Final   Streptococcus pneumoniae NOT DETECTED NOT DETECTED Final   Streptococcus pyogenes NOT DETECTED NOT DETECTED Final   Acinetobacter baumannii NOT  DETECTED NOT DETECTED Final   Enterobacteriaceae species NOT DETECTED NOT DETECTED Final   Enterobacter cloacae complex NOT DETECTED NOT DETECTED Final   Escherichia coli NOT DETECTED NOT DETECTED Final   Klebsiella oxytoca NOT DETECTED NOT DETECTED Final   Klebsiella pneumoniae NOT DETECTED NOT DETECTED Final   Proteus species NOT DETECTED NOT DETECTED Final   Serratia marcescens NOT DETECTED NOT DETECTED Final   Haemophilus influenzae NOT DETECTED NOT DETECTED Final   Neisseria meningitidis NOT DETECTED NOT DETECTED  Final   Pseudomonas aeruginosa NOT DETECTED NOT DETECTED Final   Candida albicans NOT DETECTED NOT DETECTED Final   Candida glabrata NOT DETECTED NOT DETECTED Final   Candida krusei NOT DETECTED NOT DETECTED Final   Candida parapsilosis NOT DETECTED NOT DETECTED Final   Candida tropicalis NOT DETECTED NOT DETECTED Final  Culture, blood (Routine x 2)     Status: Abnormal   Collection Time: 12/10/15  7:13 PM  Result Value Ref Range Status   Specimen Description BLOOD RIGHT ANTECUBITAL  Final   Special Requests BOTTLES DRAWN AEROBIC ONLY 5CC  Final   Culture  Setup Time   Final    GRAM POSITIVE COCCOBACILLUS AEROBIC BOTTLE ONLY CRITICAL RESULT CALLED TO, READ BACK BY AND VERIFIED WITH: J. Frens Pharm.D. 13:00 12/11/15 ([email or web address])    Culture (A)  Final    VIRIDANS STREPTOCOCCUS SUSCEPTIBILITIES PERFORMED ON PREVIOUS CULTURE WITHIN THE LAST 5 DAYS.    Report Status 12/16/2015 FINAL  Final  MRSA PCR Screening     Status: None   Collection Time: 12/11/15  6:27 AM  Result Value Ref Range Status   MRSA by PCR NEGATIVE NEGATIVE Final    Comment:        The GeneXpert MRSA Assay (FDA approved for NASAL specimens only), is one component of a comprehensive MRSA colonization surveillance program. It is not intended to diagnose MRSA infection nor to guide or monitor treatment for MRSA infections.   Aerobic/Anaerobic Culture (surgical/deep wound)     Status: None   Collection  Time: 12/11/15  1:12 PM  Result Value Ref Range Status   Specimen Description ABDOMEN  Final   Special Requests NONE  Final   Gram Stain   Final    NO WBC SEEN FEW GRAM POSITIVE COCCI IN PAIRS AND CHAINS RARE GRAM NEGATIVE RODS    Culture   Final    MODERATE STREPTOCOCCUS SALIVARIUS NO ANAEROBES ISOLATED    Report Status 12/16/2015 FINAL  Final   Organism ID, Bacteria STREPTOCOCCUS SALIVARIUS  Final      Susceptibility   Streptococcus salivarius - MIC*    PENICILLIN 1 INTERMEDIATE Intermediate     CEFTRIAXONE 0.25 SENSITIVE Sensitive     ERYTHROMYCIN >=8 RESISTANT Resistant     LEVOFLOXACIN 2 SENSITIVE Sensitive     VANCOMYCIN 1 SENSITIVE Sensitive     * MODERATE STREPTOCOCCUS SALIVARIUS  Culture, blood (Routine X 2) w Reflex to ID Panel     Status: None (Preliminary result)   Collection Time: 12/14/15  3:29 PM  Result Value Ref Range Status   Specimen Description BLOOD RIGHT HAND  Final   Special Requests BOTTLES DRAWN AEROBIC ONLY 5CC  Final   Culture NO GROWTH 4 DAYS  Final   Report Status PENDING  Incomplete  Culture, blood (Routine X 2) w Reflex to ID Panel     Status: None (Preliminary result)   Collection Time: 12/14/15  3:33 PM  Result Value Ref Range Status   Specimen Description BLOOD LEFT HAND  Final   Special Requests IN PEDIATRIC BOTTLE 1.5CC  Final   Culture NO GROWTH 4 DAYS  Final   Report Status PENDING  Incomplete    Studies/Results: Ir Sinus/fist Tube Chk-non Gi  Result Date: 12/17/2015 CLINICAL DATA:  Bile leak post cholecystectomy despite endoscopic stent. Pigtail drain catheter was placed in the gallbladder fossa but there is continued output. EXAM: ABDOMINAL DRAIN CATHETER INJECTION UNDER FLUOROSCOPY TECHNIQUE: The procedure, risks (including but not limited to bleeding, infection, organ damage ),  benefits, and alternatives were explained to the patient. Questions regarding the procedure were encouraged and answered. The patient understands and consents  to the procedure. Survey fluoroscopic inspection reveals stable position of the pigtail drain catheter in the gallbladder fossa. Injection demonstrates patency of the drain catheter. There is a small residual cavity. With slight distention, there is some reflux of contrast into the intrahepatic biliary tree. With aspiration, the cavity is completely evacuated. IMPRESSION: 1. Continued biliary leak, draining into the gallbladder fossa, well controlled by the percutaneous drain catheter. Catheter was left in place, returned to external drainage. Electronically Signed   By: Lucrezia Europe M.D.   On: 12/17/2015 16:47   Ir Guided Niel Hummer W Catheter Placement  Result Date: 12/17/2015 CLINICAL DATA:  Bile leak post cholecystectomy despite endoscopic stent placement. Continued output from gallbladder fossa drain. 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 EXAM: ULTRASOUND AND FLUOROSCOPIC GUIDED DRAINAGE OF PERIHEPATIC BILOMA ANESTHESIA/SEDATION: Intravenous Fentanyl and Versed were administered as conscious sedation during continuous monitoring of the patient's level of consciousness and physiological / cardiorespiratory status by the radiology RN, with a total moderate sedation time of 10 minutes. PROCEDURE: The procedure, risks, benefits, and alternatives were explained to the patient. Questions regarding the procedure were encouraged and answered. The patient understands and consents to the procedure. 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 The operative field was prepped with chlorhexidinein a sterile fashion, and a sterile drape was applied covering the operative field. A sterile gown and sterile gloves were used for the procedure. Local anesthesia was provided with 1% Lidocaine. Under real-time ultrasound guidance, a 21 gauge micropuncture needle was advanced into the collection. Thin  yellow-green clear fluid could be aspirated. An 018 guidewire advanced centrally within the collection, its position confirmed on fluoroscopy. This was exchanged using the transitional dilator for short Amplatz wire, and the tract was dilated to facilitate placement of a 12 French pigtail catheter, formed centrally within the collection. Small contrast injection under fluoro confirms appropriate positioning and patency. Catheter secured externally with 0 Prolene suture and StatLock and placed to gravity drain bag. The patient tolerated the procedure well. COMPLICATIONS: None immediate FINDINGS: The right perihepatic subphrenic biloma was again localized. Twelve French pigtail drain catheter placed under ultrasound and fluoroscopic guidance as above. IMPRESSION: 1. Technically successful perihepatic subphrenic biloma drain catheter placement under ultrasound and fluoroscopic guidance. Electronically Signed   By: Lucrezia Europe M.D.   On: 12/17/2015 16:45   Ir US Guide Bx Asp/drain  Result Date: 12/17/2015 CLINICAL DATA:  Bile leak post cholecystectomy despite endoscopic stent placement. Continued output from gallbladder fossa drain. 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 EXAM: ULTRASOUND AND FLUOROSCOPIC GUIDED DRAINAGE OF PERIHEPATIC BILOMA ANESTHESIA/SEDATION: Intravenous Fentanyl and Versed were administered as conscious sedation during continuous monitoring of the patient's level of consciousness and physiological / cardiorespiratory status by the radiology RN, with a total moderate sedation time of 10 minutes. PROCEDURE: The procedure, risks, benefits, and alternatives were explained to the patient. Questions regarding the procedure were encouraged and answered. The patient understands and consents to the procedure. 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 The operative field  was prepped with chlorhexidinein a sterile fashion, and a sterile drape was applied covering the operative field. A sterile gown and sterile gloves were used for the procedure. Local anesthesia was  provided with 1% Lidocaine. Under real-time ultrasound guidance, a 21 gauge micropuncture needle was advanced into the collection. Thin yellow-green clear fluid could be aspirated. An 018 guidewire advanced centrally within the collection, its position confirmed on fluoroscopy. This was exchanged using the transitional dilator for short Amplatz wire, and the tract was dilated to facilitate placement of a 12 French pigtail catheter, formed centrally within the collection. Small contrast injection under fluoro confirms appropriate positioning and patency. Catheter secured externally with 0 Prolene suture and StatLock and placed to gravity drain bag. The patient tolerated the procedure well. COMPLICATIONS: None immediate FINDINGS: The right perihepatic subphrenic biloma was again localized. Twelve French pigtail drain catheter placed under ultrasound and fluoroscopic guidance as above. IMPRESSION: 1. Technically successful perihepatic subphrenic biloma drain catheter placement under ultrasound and fluoroscopic guidance. Electronically Signed   By: Lucrezia Europe M.D.   On: 12/17/2015 16:45      Assessment/Plan:  INTERVAL HISTORY:   Continued drain output, octreotide being started by [facility]  Active Problems:   Sepsis ([place])   Bile leak   Biloma   Biloma following surgery   Status post cholecystectomy   Infection by Streptococcus, viridans group   Routine screening for STI (sexually transmitted infection)   Bacteremia    [name] is a 65 y.o. female with  Recent cholecystectomy with postoperative subjective fevers and low grade temperatures readmitted with fevers, confusion and found now to be bacteremic with Viridans group streptococci and with bile leak and intrabdominal infected bilious abscess sp IR  drainage  #1 Viridans group streptococci: per micro the organism was I to PCN with MIC =1   Given predilection for heart valves and given that her malaise symptoms could have been more than a few days would favor checking TEE to ensure no endocarditis and understand she is to have this procedure today  MAIN REASON NOW BEING THAT IF SHE HAD ENDOCARDITIS WITH VIRIDANS WITH HIGHER MIC TO PCN Guidelines would be for high dose PCN AND gentamicin together for 4 weeks  --repeat blood cultures NGTD  - continue rocephin IV and flagyl po  #2 Infected bilious fluid colleciton: growing S. Salivarious, 2 drains in place  #3 ? Bile leak: pt and friend very worried about this.octreotide being started    LOS: 8 days   Alcide Evener 12/19/2015, 1:01 PM

## 2015-12-19 NOTE — Progress Notes (Signed)
Referring Physician(s): Dr Mamie Laurel  Supervising Physician: Marybelle Killings  Patient Status:  Inpatient  Chief Complaint:  cholecystectomy Biloma drain placed 10/3 ERCP stent 10/4 Pancreatitis New biloma drain 10/9  Subjective:  Output from 2nd biloma drain has much drainage 2.7L yesterday Only 20 cc yesterday from 1st drain Both bile Pt does feel better today Eating some better  Allergies: Shellfish allergy  Medications: Prior to Admission medications   Medication Sig Start Date End Date Taking? Authorizing Provider  atorvastatin (LIPITOR) 40 MG tablet Take 1 tablet (40 mg total) by mouth daily. 01/04/15  Yes Elberta Leatherwood, MD  clopidogrel (PLAVIX) 75 MG tablet TAKE 1 TABLET BY MOUTH EVERY DAY 09/04/15  Yes Elberta Leatherwood, MD  doxepin (SINEQUAN) 100 MG capsule Take 2 capsules (200 mg total) by mouth at bedtime. 10/15/14  Yes Kathrynn Ducking, MD  furosemide (LASIX) 20 MG tablet Take 10 mg by mouth daily. 05/09/14  Yes Historical Provider, MD  gabapentin (NEURONTIN) 600 MG tablet Take 1 tablet (600 mg total) by mouth 2 (two) times daily. 12/05/15  Yes Charlett Blake, MD  lidocaine (LIDODERM) 5 % Place 2 patches onto the skin daily. Remove & Discard patch within 12 hours or as directed by MD 11/28/15  Yes Charlett Blake, MD  meclizine (ANTIVERT) 25 MG tablet Take 1 tablet (25 mg total) by mouth 2 (two) times daily as needed for dizziness. 07/16/15  Yes Elberta Leatherwood, MD  ondansetron (ZOFRAN-ODT) 4 MG disintegrating tablet Take 1 tablet (4 mg total) by mouth every 6 (six) hours as needed for nausea. 12/08/15  Yes Jerrye Beavers, PA-C  oxyCODONE (OXY IR/ROXICODONE) 5 MG immediate release tablet Take 1-2 tablets (5-10 mg total) by mouth every 4 (four) hours as needed for moderate pain. 12/08/15  Yes Brooke A Miller, PA-C  ramipril (ALTACE) 2.5 MG capsule TAKE ONE CAPSULE BY MOUTH EVERY DAY 10/01/15  Yes Elberta Leatherwood, MD  tiZANidine (ZANAFLEX) 2 MG tablet Take 1 tablet (2 mg total) by  mouth 3 (three) times daily. 09/12/15  Yes Ward Givens, NP  topiramate (TOPAMAX) 100 MG tablet TAKE 1 TABLET TWICE A DAY 05/07/15  Yes Kathrynn Ducking, MD  sertraline (ZOLOFT) 50 MG tablet TAKE 1 TABLET (50 MG TOTAL) BY MOUTH 2 (TWO) TIMES DAILY. 12/13/15   Elberta Leatherwood, MD     Vital Signs: BP 138/86 (BP Location: Left Arm)   Pulse 94   Temp 98.8 F (37.1 C) (Oral)   Resp 18   Ht 5\' 7"  (1.702 m)   Wt 166 lb (75.3 kg)   SpO2 100%   BMI 26.00 kg/m   Physical Exam  Constitutional: She is oriented to person, place, and time.  Abdominal: Soft. Bowel sounds are normal.  Neurological: She is alert and oriented to person, place, and time.  Skin: Skin is warm and dry.  Sites of drains are clean and dry Sl tender No sign of infection  Output for both is bile 2nd drain over 2 liters yesterday 1st drain minimal output  Nursing note and vitals reviewed.   Imaging: Ct Abdomen Pelvis W Contrast  Result Date: 12/15/2015 CLINICAL DATA:  Right upper quadrant abdominal pain. Patient is 2 weeks post cholecystectomy with bile leak. EXAM: CT ABDOMEN AND PELVIS WITH CONTRAST TECHNIQUE: Multidetector CT imaging of the abdomen and pelvis was performed using the standard protocol following bolus administration of intravenous contrast. CONTRAST:  156mL ISOVUE-300 IOPAMIDOL (ISOVUE-300) INJECTION 61% COMPARISON:  CT  of the abdomen 12/11/2015, ERCP 12/12/2015 FINDINGS: Lower chest: Moderate in severity a calcific coronary artery atherosclerotic disease. Bilateral water density pleural effusions, right greater than left, with associated subsegmental and segmental atelectasis. Hepatobiliary: There is a 11.2 by 5.4 by 13.2 cm subcapsular water density fluid collection along the superior lateral margin of the liver. Percutaneous pigtail catheter is seen entering the abdomen from the right upper quadrant approach and terminating inferior to the liver in the gallbladder fossa. Patient is status post  cholecystectomy. Small amount of water density free fluid is seen along the tract of the percutaneous drainage catheter and within the surgical bed. The collection within the surgical bed measures 5.6 x 2.3 cm, decreased from prior measurement of 7.2 x 3.8 cm. A metallic stent is present within the extrahepatic common bile duct terminating in the second portion of the duodenum. Pancreas: Unremarkable. No pancreatic ductal dilatation or surrounding inflammatory changes. Spleen: Normal in size without focal abnormality. . Adrenals/Urinary Tract: Adrenal glands are unremarkable. Kidneys are normal, without renal calculi, focal lesion, or hydronephrosis. Bladder is unremarkable. Stomach/Bowel: Stomach is within normal limits. Appendix appears normal. No evidence of bowel wall thickening, distention, or inflammatory changes. Vascular/Lymphatic: Aortic atherosclerosis. No enlarged abdominal or pelvic lymph nodes. Reproductive: Status post hysterectomy. No adnexal masses. Other: Small amount of water density fluid in the pelvis. Musculoskeletal: No acute osseous findings. 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 Superior endplate compression deformity of L1 vertebral body may be degenerative or represent mild compression fracture with chronic appearance. IMPRESSION: Interval placement of percutaneous drainage catheter within the gallbladder fossa with decrease of the previously demonstrated biloma which now measures 5.6 x 2.3 cm (prior measurement of 7.2 x 3.8 cm ). This collection is contiguous with stable in size subcapsular hepatic collection which measures 11.2 x 5.4 x 13.2 cm. Common bile duct metallic stent in satisfactory position. Slightly increased in size bilateral pleural effusions, right greater than left, with right lower lobe segmental atelectasis. Advanced calcific atherosclerotic disease of the coronary arteries and distal abdominal aorta. Electronically Signed   By: Fidela Salisbury M.D.   On: 12/15/2015 16:46   Ir Sinus/fist Tube Chk-non Gi  Result Date: 12/17/2015 CLINICAL DATA:  Bile leak post cholecystectomy despite endoscopic stent. Pigtail drain catheter was placed in the gallbladder fossa but there is continued output. EXAM: ABDOMINAL DRAIN CATHETER INJECTION UNDER FLUOROSCOPY TECHNIQUE: The procedure, risks (including but not limited to bleeding, infection, organ damage ), benefits, and alternatives were explained to the patient. Questions regarding the procedure were encouraged and answered. The patient understands and consents to the procedure. Survey fluoroscopic inspection reveals stable position of the pigtail drain catheter in the gallbladder fossa. Injection demonstrates patency of the drain catheter. There is a small residual cavity. With slight distention, there is some reflux of contrast into the intrahepatic biliary tree. With aspiration, the cavity is completely evacuated. IMPRESSION: 1. Continued biliary leak, draining into the gallbladder fossa, well controlled by the percutaneous drain catheter. Catheter was left in place, returned to external drainage. Electronically Signed   By: Lucrezia Europe M.D.   On: 12/17/2015 16:47   Ir Guided Niel Hummer W Catheter Placement  Result Date: 12/17/2015 CLINICAL DATA:  Bile leak post cholecystectomy despite endoscopic stent placement. Continued output from gallbladder fossa drain. 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 EXAM: ULTRASOUND AND FLUOROSCOPIC GUIDED DRAINAGE OF PERIHEPATIC BILOMA ANESTHESIA/SEDATION: Intravenous Fentanyl and Versed were administered as conscious sedation during continuous monitoring of the patient's  level of consciousness and physiological / cardiorespiratory status by the radiology RN, with a total moderate sedation time of 10 minutes. PROCEDURE: The procedure, risks, benefits, and alternatives were explained to the patient. Questions regarding the procedure  were encouraged and answered. The patient understands and consents to the procedure. 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 The operative field was prepped with chlorhexidinein a sterile fashion, and a sterile drape was applied covering the operative field. A sterile gown and sterile gloves were used for the procedure. Local anesthesia was provided with 1% Lidocaine. Under real-time ultrasound guidance, a 21 gauge micropuncture needle was advanced into the collection. Thin yellow-green clear fluid could be aspirated. An 018 guidewire advanced centrally within the collection, its position confirmed on fluoroscopy. This was exchanged using the transitional dilator for short Amplatz wire, and the tract was dilated to facilitate placement of a 12 French pigtail catheter, formed centrally within the collection. Small contrast injection under fluoro confirms appropriate positioning and patency. Catheter secured externally with 0 Prolene suture and StatLock and placed to gravity drain bag. The patient tolerated the procedure well. COMPLICATIONS: None immediate FINDINGS: The right perihepatic subphrenic biloma was again localized. Twelve French pigtail drain catheter placed under ultrasound and fluoroscopic guidance as above. IMPRESSION: 1. Technically successful perihepatic subphrenic biloma drain catheter placement under ultrasound and fluoroscopic guidance. Electronically Signed   By: Lucrezia Europe M.D.   On: 12/17/2015 16:45   Ir US Guide Bx Asp/drain  Result Date: 12/17/2015 CLINICAL DATA:  Bile leak post cholecystectomy despite endoscopic stent placement. Continued output from gallbladder fossa drain. 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 EXAM: ULTRASOUND AND FLUOROSCOPIC GUIDED DRAINAGE OF PERIHEPATIC BILOMA ANESTHESIA/SEDATION: Intravenous Fentanyl and Versed were administered  as conscious sedation during continuous monitoring of the patient's level of consciousness and physiological / cardiorespiratory status by the radiology RN, with a total moderate sedation time of 10 minutes. PROCEDURE: The procedure, risks, benefits, and alternatives were explained to the patient. Questions regarding the procedure were encouraged and answered. The patient understands and consents to the procedure. 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 The operative field was prepped with chlorhexidinein a sterile fashion, and a sterile drape was applied covering the operative field. A sterile gown and sterile gloves were used for the procedure. Local anesthesia was provided with 1% Lidocaine. Under real-time ultrasound guidance, a 21 gauge micropuncture needle was advanced into the collection. Thin yellow-green clear fluid could be aspirated. An 018 guidewire advanced centrally within the collection, its position confirmed on fluoroscopy. This was exchanged using the transitional dilator for short Amplatz wire, and the tract was dilated to facilitate placement of a 12 French pigtail catheter, formed centrally within the collection. Small contrast injection under fluoro confirms appropriate positioning and patency. Catheter secured externally with 0 Prolene suture and StatLock and placed to gravity drain bag. The patient tolerated the procedure well. COMPLICATIONS: None immediate FINDINGS: The right perihepatic subphrenic biloma was again localized. Twelve French pigtail drain catheter placed under ultrasound and fluoroscopic guidance as above. IMPRESSION: 1. Technically successful perihepatic subphrenic biloma drain catheter placement under ultrasound and fluoroscopic guidance. Electronically Signed   By: Lucrezia Europe M.D.   On: 12/17/2015 16:45    Labs:  CBC:  Recent Labs  12/15/15 0312 12/16/15 0241  12/18/15 0849 12/19/15 0339  WBC 11.7* 15.4* 10.3 12.1*  HGB 10.3* 9.9* 10.9* 10.8*  HCT 31.4* 30.8* 34.5* 34.2*  PLT PLATELET CLUMPS NOTED ON SMEAR, COUNT APPEARS ADEQUATE 500* 597* 640*    COAGS:  Recent Labs  12/05/15 0406 12/11/15 0729 12/17/15 0447  INR 1.03 1.10 1.42  APTT 33  --   --     BMP:  Recent Labs  12/14/15 0453 12/15/15 0312 12/16/15 0241 12/19/15 0339  NA 137 138 138 139  K 3.1* 4.8 4.1 4.3  CL 105 107 103 106  CO2 20* 22 25 23   GLUCOSE 108* 102* 98 115*  BUN <5* <5* <5* <5*  CALCIUM 8.0* 7.8* 8.2* 8.3*  CREATININE 0.87 0.73 0.71 0.78  GFRNONAA >60 >60 >60 >60  GFRAA >60 >60 >60 >60    LIVER FUNCTION TESTS:  Recent Labs  12/13/15 0444 12/14/15 0453 12/16/15 0241 12/19/15 0339  BILITOT 0.8 0.6 0.4 0.3  AST 30 43* 45* 50*  ALT 26 31 36 32  ALKPHOS 125 148* 132* 207*  PROT 5.3* 6.3* 5.3* 5.6*  ALBUMIN 1.9* 2.3* 1.8* 2.1*    Assessment and Plan:  Biloma drains intact Lots of output from 2nd drain 1st drain output is minimal Will follow  Electronically Signed: Latashia Koch A 12/19/2015, 9:52 AM   I spent a total of 15 Minutes at the the patient's bedside AND on the patient's hospital floor or unit, greater than 50% of which was counseling/coordinating care for biloma drains

## 2015-12-20 ENCOUNTER — Encounter (HOSPITAL_COMMUNITY): Payer: Self-pay | Admitting: General Practice

## 2015-12-20 ENCOUNTER — Encounter: Payer: Self-pay | Admitting: Gastroenterology

## 2015-12-20 LAB — COMPREHENSIVE METABOLIC PANEL
ALT: 31 U/L (ref 14–54)
AST: 41 U/L (ref 15–41)
Albumin: 2.4 g/dL — ABNORMAL LOW (ref 3.5–5.0)
Alkaline Phosphatase: 251 U/L — ABNORMAL HIGH (ref 38–126)
Anion gap: 11 (ref 5–15)
BUN: <5 mg/dL — ABNORMAL LOW (ref 6–20)
CO2: 25 mmol/L (ref 22–32)
Calcium: 8.3 mg/dL — ABNORMAL LOW (ref 8.9–10.3)
Chloride: 103 mmol/L (ref 101–111)
Creatinine, Ser: 0.82 mg/dL (ref 0.44–1.00)
GFR calc Af Amer: >60 mL/min (ref >60)
GFR calc non Af Amer: >60 mL/min (ref >60)
Glucose, Bld: 144 mg/dL — ABNORMAL HIGH (ref 65–99)
Potassium: 4 mmol/L (ref 3.5–5.1)
Sodium: 139 mmol/L (ref 135–145)
Total Bilirubin: 0.3 mg/dL (ref 0.3–1.2)
Total Protein: 6 g/dL — ABNORMAL LOW (ref 6.5–8.1)

## 2015-12-20 LAB — CBC
HCT: 35.1 % — ABNORMAL LOW (ref 36.0–46.0)
Hemoglobin: 11.3 g/dL — ABNORMAL LOW (ref 12.0–15.0)
MCH: 28.8 pg (ref 26.0–34.0)
MCHC: 32.2 g/dL (ref 30.0–36.0)
MCV: 89.3 fL (ref 78.0–100.0)
Platelets: 649 10*3/uL — ABNORMAL HIGH (ref 150–400)
RBC: 3.93 MIL/uL (ref 3.87–5.11)
RDW: 15.7 % — ABNORMAL HIGH (ref 11.5–15.5)
WBC: 12.4 10*3/uL — ABNORMAL HIGH (ref 4.0–10.5)

## 2015-12-20 LAB — PREALBUMIN: Prealbumin: 9.8 mg/dL — ABNORMAL LOW (ref 18–38)

## 2015-12-20 MED ORDER — ALPRAZOLAM 0.25 MG PO TABS
0.2500 mg | ORAL_TABLET | Freq: Once | ORAL | Status: AC
  Administered 2015-12-20: 0.25 mg via ORAL
  Filled 2015-12-20: qty 1

## 2015-12-20 MED ORDER — HYDRALAZINE HCL 20 MG/ML IJ SOLN: 5.0000 mg | Freq: Four times a day (QID) | INTRAMUSCULAR | Status: DC | PRN

## 2015-12-20 NOTE — Progress Notes (Signed)
Subjective:  Patient had some nausea last night but drain output has reduced   Antibiotics:  Anti-infectives    Start     Dose/Rate Route Frequency Ordered Stop   12/14/15 1445  cefTRIAXone (ROCEPHIN) 2 g in dextrose 5 % 50 mL IVPB     2 g 100 mL/hr over 30 Minutes Intravenous Every 24 hours 12/14/15 1435     12/14/15 1445  metroNIDAZOLE (FLAGYL) tablet 500 mg     500 mg Oral Every 8 hours 12/14/15 1435     12/12/15 0930  vancomycin (VANCOCIN) IVPB 1000 mg/200 mL premix  Status:  Discontinued     1,000 mg 200 mL/hr over 60 Minutes Intravenous Every 12 hours 12/12/15 0913 12/14/15 1435   12/11/15 0800  piperacillin-tazobactam (ZOSYN) IVPB 3.375 g  Status:  Discontinued     3.375 g 12.5 mL/hr over 240 Minutes Intravenous Every 8 hours 12/11/15 0536 12/14/15 1435   12/11/15 0045  piperacillin-tazobactam (ZOSYN) IVPB 3.375 g     3.375 g 100 mL/hr over 30 Minutes Intravenous  Once 12/11/15 0038 12/11/15 0339      Medications: Scheduled Meds: . cefTRIAXone (ROCEPHIN)  IV  2 g Intravenous Q24H  . docusate sodium  100 mg Oral BID  . enoxaparin (LOVENOX) injection  40 mg Subcutaneous Q24H  . famotidine  20 mg Oral BID  . feeding supplement  1 Container Oral BID BM  . metroNIDAZOLE  500 mg Oral Q8H  . octreotide  100 mcg Intravenous TID  . saccharomyces boulardii  250 mg Oral BID  . senna  1 tablet Oral BID   Continuous Infusions: . lactated ringers with KCl 20 mEq/L Stopped (12/19/15 1415)   PRN Meds:.acetaminophen **OR** acetaminophen, bisacodyl, hydrALAZINE, HYDROcodone-acetaminophen, HYDROmorphone (DILAUDID) injection, methocarbamol, ondansetron **OR** ondansetron (ZOFRAN) IV, promethazine, technetium TC 33M mebrofenin    Objective: Weight change:   Intake/Output Summary (Last 24 hours) at 12/20/15 1741 Last data filed at 12/20/15 1600  Gross per 24 hour  Intake              740 ml  Output             1385 ml  Net             -645 ml   Blood pressure (!)  163/85, pulse 89, temperature 98 F (36.7 C), temperature source Oral, resp. rate 18, height 5\' 7"  (1.702 m), weight 166 lb (75.3 kg), SpO2 98 %. Temp:  [98 F (36.7 C)-98.8 F (37.1 C)] 98 F (36.7 C) (10/12 1405) Pulse Rate:  [89-95] 89 (10/12 1405) Resp:  [18-22] 18 (10/12 1405) BP: (135-163)/(77-91) 163/85 (10/12 1405) SpO2:  [93 %-98 %] 98 % (10/12 1405)  Physical Exam: General: Alert and awake, oriented x3, dysphoric HEENT: anicteric sclera,  EOMI, oropharynx clear and without exudate Cardiovascular: tachy rate, normal r,  no murmur rubs or gallops Pulmonary: clear to auscultation bilaterally, no wheezing, rales or rhonchi Gastrointestinal: soft ttp RUQ, +bs, drain with increased bilious  material present, 2nd drain present as well Skin, soft tissue: no rashes Neuro: nonfocal, strength and sensation intact   CBC:  CBC Latest Ref Rng & Units 12/20/2015 12/19/2015 12/18/2015  WBC 4.0 - 10.5 K/uL 12.4(H) 12.1(H) 10.3  Hemoglobin 12.0 - 15.0 g/dL 11.3(L) 10.8(L) 10.9(L)  Hematocrit 36.0 - 46.0 % 35.1(L) 34.2(L) 34.5(L)  Platelets 150 - 400 K/uL 649(H) 640(H) 597(H)      BMET  Recent Labs  12/19/15 0339 12/20/15 0518  NA 139 139  K 4.3 4.0  CL 106 103  CO2 23 25  GLUCOSE 115* 144*  BUN <5* <5*  CREATININE 0.78 0.82  CALCIUM 8.3* 8.3*     Liver Panel   Recent Labs  12/19/15 0339 12/20/15 0518  PROT 5.6* 6.0*  ALBUMIN 2.1* 2.4*  AST 50* 41  ALT 32 31  ALKPHOS 207* 251*  BILITOT 0.3 0.3       Sedimentation Rate No results for input(s): ESRSEDRATE in the last 72 hours. C-Reactive Protein No results for input(s): CRP in the last 72 hours.  Micro Results: Recent Results (from the past 720 hour(s))  Surgical pcr screen     Status: None   Collection Time: 12/05/15  3:35 AM  Result Value Ref Range Status   MRSA, PCR NEGATIVE NEGATIVE Final   Staphylococcus aureus NEGATIVE NEGATIVE Final    Comment:        The Xpert SA Assay (FDA approved for  NASAL specimens in patients over 74 years of age), is one component of a comprehensive surveillance program.  Test performance has been validated by [facility] for patients greater than or equal to 62 year old. It is not intended to diagnose infection nor to guide or monitor treatment.   Culture, blood (Routine x 2)     Status: Abnormal   Collection Time: 12/10/15  7:00 PM  Result Value Ref Range Status   Specimen Description BLOOD LEFT HAND  Final   Special Requests BOTTLES DRAWN AEROBIC ONLY 5CC  Final   Culture  Setup Time   Final    GRAM POSITIVE COCCOBACILLUS AEROBIC BOTTLE ONLY Organism ID to follow CRITICAL RESULT CALLED TO, READ BACK BY AND VERIFIED WITH: J. Frens Pharm.D. 13:00 12/11/15 ([email or web address])    Culture VIRIDANS STREPTOCOCCUS (A)  Final   Report Status 12/17/2015 FINAL  Final   Organism ID, Bacteria VIRIDANS STREPTOCOCCUS  Final      Susceptibility   Viridans streptococcus - MIC*    PENICILLIN 1 INTERMEDIATE Intermediate     CEFTRIAXONE 0.25 SENSITIVE Sensitive     ERYTHROMYCIN >=8 RESISTANT Resistant     LEVOFLOXACIN 2 SENSITIVE Sensitive     VANCOMYCIN 1 SENSITIVE Sensitive     * VIRIDANS STREPTOCOCCUS  Urine culture     Status: None   Collection Time: 12/10/15  7:00 PM  Result Value Ref Range Status   Specimen Description Urine  Final   Special Requests NONE  Final   Culture NO GROWTH  Final   Report Status 12/12/2015 FINAL  Final  Blood Culture ID Panel (Reflexed)     Status: Abnormal   Collection Time: 12/10/15  7:00 PM  Result Value Ref Range Status   Enterococcus species NOT DETECTED NOT DETECTED Final   Listeria monocytogenes NOT DETECTED NOT DETECTED Final   Staphylococcus species NOT DETECTED NOT DETECTED Final   Staphylococcus aureus NOT DETECTED NOT DETECTED Final   Streptococcus species DETECTED (A) NOT DETECTED Final    Comment: CRITICAL RESULT CALLED TO, READ BACK BY AND VERIFIED WITH: J. Frens Pharm.D. 13:00 12/11/15 ([email or web address])     Streptococcus agalactiae NOT DETECTED NOT DETECTED Final   Streptococcus pneumoniae NOT DETECTED NOT DETECTED Final   Streptococcus pyogenes NOT DETECTED NOT DETECTED Final   Acinetobacter baumannii NOT DETECTED NOT DETECTED Final   Enterobacteriaceae species NOT DETECTED NOT DETECTED Final   Enterobacter cloacae complex NOT DETECTED NOT DETECTED Final   Escherichia coli NOT DETECTED NOT DETECTED Final   Klebsiella oxytoca NOT DETECTED  NOT DETECTED Final   Klebsiella pneumoniae NOT DETECTED NOT DETECTED Final   Proteus species NOT DETECTED NOT DETECTED Final   Serratia marcescens NOT DETECTED NOT DETECTED Final   Haemophilus influenzae NOT DETECTED NOT DETECTED Final   Neisseria meningitidis NOT DETECTED NOT DETECTED Final   Pseudomonas aeruginosa NOT DETECTED NOT DETECTED Final   Candida albicans NOT DETECTED NOT DETECTED Final   Candida glabrata NOT DETECTED NOT DETECTED Final   Candida krusei NOT DETECTED NOT DETECTED Final   Candida parapsilosis NOT DETECTED NOT DETECTED Final   Candida tropicalis NOT DETECTED NOT DETECTED Final  Culture, blood (Routine x 2)     Status: Abnormal   Collection Time: 12/10/15  7:13 PM  Result Value Ref Range Status   Specimen Description BLOOD RIGHT ANTECUBITAL  Final   Special Requests BOTTLES DRAWN AEROBIC ONLY 5CC  Final   Culture  Setup Time   Final    GRAM POSITIVE COCCOBACILLUS AEROBIC BOTTLE ONLY CRITICAL RESULT CALLED TO, READ BACK BY AND VERIFIED WITH: J. Frens Pharm.D. 13:00 12/11/15 ([email or web address])    Culture (A)  Final    VIRIDANS STREPTOCOCCUS SUSCEPTIBILITIES PERFORMED ON PREVIOUS CULTURE WITHIN THE LAST 5 DAYS.    Report Status 12/16/2015 FINAL  Final  MRSA PCR Screening     Status: None   Collection Time: 12/11/15  6:27 AM  Result Value Ref Range Status   MRSA by PCR NEGATIVE NEGATIVE Final    Comment:        The GeneXpert MRSA Assay (FDA approved for NASAL specimens only), is one component of a comprehensive MRSA  colonization surveillance program. It is not intended to diagnose MRSA infection nor to guide or monitor treatment for MRSA infections.   Aerobic/Anaerobic Culture (surgical/deep wound)     Status: None   Collection Time: 12/11/15  1:12 PM  Result Value Ref Range Status   Specimen Description ABDOMEN  Final   Special Requests NONE  Final   Gram Stain   Final    NO WBC SEEN FEW GRAM POSITIVE COCCI IN PAIRS AND CHAINS RARE GRAM NEGATIVE RODS    Culture   Final    MODERATE STREPTOCOCCUS SALIVARIUS NO ANAEROBES ISOLATED    Report Status 12/16/2015 FINAL  Final   Organism ID, Bacteria STREPTOCOCCUS SALIVARIUS  Final      Susceptibility   Streptococcus salivarius - MIC*    PENICILLIN 1 INTERMEDIATE Intermediate     CEFTRIAXONE 0.25 SENSITIVE Sensitive     ERYTHROMYCIN >=8 RESISTANT Resistant     LEVOFLOXACIN 2 SENSITIVE Sensitive     VANCOMYCIN 1 SENSITIVE Sensitive     * MODERATE STREPTOCOCCUS SALIVARIUS  Culture, blood (Routine X 2) w Reflex to ID Panel     Status: None   Collection Time: 12/14/15  3:29 PM  Result Value Ref Range Status   Specimen Description BLOOD RIGHT HAND  Final   Special Requests BOTTLES DRAWN AEROBIC ONLY 5CC  Final   Culture NO GROWTH 5 DAYS  Final   Report Status 12/19/2015 FINAL  Final  Culture, blood (Routine X 2) w Reflex to ID Panel     Status: None   Collection Time: 12/14/15  3:33 PM  Result Value Ref Range Status   Specimen Description BLOOD LEFT HAND  Final   Special Requests IN PEDIATRIC BOTTLE 1.5CC  Final   Culture NO GROWTH 5 DAYS  Final   Report Status 12/19/2015 FINAL  Final    Studies/Results: No results found.    Assessment/Plan:  INTERVAL  HISTORY:   Continued drain output, octreotide being started by [facility]  TEE is clean  Active Problems:   Sepsis ([place])   Bile leak   Biloma   Biloma following surgery   Status post cholecystectomy   Infection by Streptococcus, viridans group   Routine screening for STI (sexually  transmitted infection)   Bacteremia    ALIVEA ARCHULETA is a 65 y.o. female with  Recent cholecystectomy with postoperative subjective fevers and low grade temperatures readmitted with fevers, confusion and found now to be bacteremic with Viridans group streptococci and with bile leak and intrabdominal infected bilious abscess sp IR drainage  #1 Viridans group streptococci: and infected bilious fluid collection with Strep salivarius:  She has had 10 days of effective antibiotics since she was first bacteremic (the 2nd) and 9 since she had her infected bile collection drained  I would favor her getting IV rocephin and oral flagyl through 12/24/15 and then DC antibiotics.   If patient is against doing IV then she could go home on oral levaquin and oral flagyl through the 16th (though I like to avoid FQ due to concerns for Clostridium difficile)   #2 ? Bile leak: pt and friend very worried about this.octreotide being started   She can then followup as needed with [place] in ID.  I will sign off for now.  Please call with further questions.    LOS: 9 days   Alcide Evener 12/20/2015, 5:41 PM

## 2015-12-20 NOTE — Progress Notes (Signed)
   She continues to improve and drain output is dropping.  We will sign off but available if needed - I think she is headed in right direction with treatment of bile leak.   I have made her an appointment to see Dr. Loletha Carrow on 12/1 to f/u and arrange biliary stent removal after that assuming all is well then.  Gatha Mayer, MD, [facility] [phone] (pager) [phone] after 5 PM, weekends and holidays  12/20/2015 3:35 PM

## 2015-12-20 NOTE — Progress Notes (Signed)
[facility] Progress Note  1 Day Post-Op  Subjective: Reports feeling much better this AM. RUQ soreness decreased significantly to ~ 4/10. Did have one episode of vomiting yesterday after her procedure which she attributes to either the chicken she ate for dinner or taking a lot of medications at once. Denies nausea this AM. Tolerated eggs and toast at breakfast. Ambulating. Urinating without hesitancy. + flatus and BMs.  Drain output decreased significantly from yesterday: Drain 1: 945 mL (placed 10/9) Drain 2: 90 mL Bilious  Objective: Vital signs in last 24 hours: Temp:  [98.3 F (36.8 C)-98.8 F (37.1 C)] 98.5 F (36.9 C) (10/12 0927) Pulse Rate:  [89-105] 92 (10/12 0927) Resp:  [18-34] 18 (10/12 0527) BP: (135-193)/(77-119) 135/77 (10/12 0927) SpO2:  [93 %-98 %] 93 % (10/12 0927) Last BM Date: 12/19/15  Intake/Output from previous day: 10/11 0701 - 10/12 0700 In: 1085 [P.O.:360; I.V.:655; IV Piggyback:50] Out: 6010 [Urine:550; Drains:1035]  PE: Gen:  Alert, NAD, pleasant Card:  RRR, no M/G/R  Pulm:  CTA, no W/R/R Abd: Soft, appropriately tender around drain sites, ND, +BS, drain sites and incisions c/d/i; no peritonitis  Lab Results:   Recent Labs  12/19/15 0339 12/20/15 0518  WBC 12.1* 12.4*  HGB 10.8* 11.3*  HCT 34.2* 35.1*  PLT 640* 649*   BMET  Recent Labs  12/19/15 0339 12/20/15 0518  NA 139 139  K 4.3 4.0  CL 106 103  CO2 23 25  GLUCOSE 115* 144*  BUN <5* <5*  CREATININE 0.78 0.82  CALCIUM 8.3* 8.3*   CMP     Component Value Date/Time   NA 139 12/20/2015 0518   K 4.0 12/20/2015 0518   CL 103 12/20/2015 0518   CO2 25 12/20/2015 0518   GLUCOSE 144 (H) 12/20/2015 0518   BUN <5 (L) 12/20/2015 0518   CREATININE 0.82 12/20/2015 0518   CREATININE 1.17 (H) 07/13/2015 1056   CALCIUM 8.3 (L) 12/20/2015 0518   PROT 6.0 (L) 12/20/2015 0518   ALBUMIN 2.4 (L) 12/20/2015 0518   AST 41 12/20/2015 0518   ALT 31 12/20/2015 0518   ALKPHOS 251 (H) 12/20/2015 0518   BILITOT 0.3 12/20/2015 0518   GFRNONAA >60 12/20/2015 0518   GFRNONAA 50 (L) 07/13/2015 1056   GFRAA >60 12/20/2015 0518   GFRAA 57 (L) 07/13/2015 1056   Lipase     Component Value Date/Time   LIPASE 47 12/16/2015 0241   Anti-infectives: Anti-infectives    Start     Dose/Rate Route Frequency Ordered Stop   12/14/15 1445  cefTRIAXone (ROCEPHIN) 2 g in dextrose 5 % 50 mL IVPB     2 g 100 mL/hr over 30 Minutes Intravenous Every 24 hours 12/14/15 1435     12/14/15 1445  metroNIDAZOLE (FLAGYL) tablet 500 mg     500 mg Oral Every 8 hours 12/14/15 1435     12/12/15 0930  vancomycin (VANCOCIN) IVPB 1000 mg/200 mL premix  Status:  Discontinued     1,000 mg 200 mL/hr over 60 Minutes Intravenous Every 12 hours 12/12/15 0913 12/14/15 1435   12/11/15 0800  piperacillin-tazobactam (ZOSYN) IVPB 3.375 g  Status:  Discontinued     3.375 g 12.5 mL/hr over 240 Minutes Intravenous Every 8 hours 12/11/15 0536 12/14/15 1435   12/11/15 0045  piperacillin-tazobactam (ZOSYN) IVPB 3.375 g     3.375 g 100 mL/hr over 30 Minutes Intravenous  Once 12/11/15 0038 12/11/15 0339     Assessment/Plan S/p Procedure(s): ENDOSCOPIC RETROGRADE CHOLANGIOPANCREATOGRAPHY (ERCP)  BILIARY STENT PLACEMENT For post-op bile leak s/p lac chole w/ IOC 12/06/15, Dr. Barry Dienes - RUQ drain placed 12/12/15 - culture: viridans streptococcus, started vanc 12/12/15 - second, perihepatic drain placed 12/17/15  - Alk phos elevation 250 - WBC still elevated 12.4 from 12.1 yesterday, afebrile - 10/11 added octreotide  Lower extremity edema: Lasix 20 mg daily  TEE- negative for endocarditis  FEN: as tolerated, Boost BID ID: Zosyn 10/3 - 10/5, vanc 10/4-10/6, Rocephin 10/6 >>, Flagyl 10/6 >> VTE: SCD's, Lovenox   Plan:continue non-operative management and diet as tolerated + Boost. Zofran PRN for nausea.  continue IV abx per ID, AM labs, monitor drain output    LOS: 9 days    Jill Alexanders  , [facility] 12/20/2015, 9:42 AM Pager: [phone] Consults: [phone] Mon-Fri 7:00 am-4:30 pm Sat-Sun 7:00 am-11:30 am

## 2015-12-21 LAB — CBC
HCT: 32.8 % — ABNORMAL LOW (ref 36.0–46.0)
Hemoglobin: 10.6 g/dL — ABNORMAL LOW (ref 12.0–15.0)
MCH: 28.6 pg (ref 26.0–34.0)
MCHC: 32.3 g/dL (ref 30.0–36.0)
MCV: 88.6 fL (ref 78.0–100.0)
Platelets: 498 10*3/uL — ABNORMAL HIGH (ref 150–400)
RBC: 3.7 MIL/uL — ABNORMAL LOW (ref 3.87–5.11)
RDW: 15.6 % — ABNORMAL HIGH (ref 11.5–15.5)
WBC: 11.8 10*3/uL — ABNORMAL HIGH (ref 4.0–10.5)

## 2015-12-21 LAB — COMPREHENSIVE METABOLIC PANEL
ALT: 22 U/L (ref 14–54)
AST: 30 U/L (ref 15–41)
Albumin: 2.2 g/dL — ABNORMAL LOW (ref 3.5–5.0)
Alkaline Phosphatase: 180 U/L — ABNORMAL HIGH (ref 38–126)
Anion gap: 10 (ref 5–15)
BUN: <5 mg/dL — ABNORMAL LOW (ref 6–20)
CO2: 26 mmol/L (ref 22–32)
Calcium: 8 mg/dL — ABNORMAL LOW (ref 8.9–10.3)
Chloride: 105 mmol/L (ref 101–111)
Creatinine, Ser: 0.82 mg/dL (ref 0.44–1.00)
GFR calc Af Amer: >60 mL/min (ref >60)
GFR calc non Af Amer: >60 mL/min (ref >60)
Glucose, Bld: 128 mg/dL — ABNORMAL HIGH (ref 65–99)
Potassium: 3.6 mmol/L (ref 3.5–5.1)
Sodium: 141 mmol/L (ref 135–145)
Total Bilirubin: 0.2 mg/dL — ABNORMAL LOW (ref 0.3–1.2)
Total Protein: 5.4 g/dL — ABNORMAL LOW (ref 6.5–8.1)

## 2015-12-21 MED ORDER — PANTOPRAZOLE SODIUM 40 MG PO TBEC
40.0000 mg | DELAYED_RELEASE_TABLET | Freq: Two times a day (BID) | ORAL | Status: DC
  Administered 2015-12-21 – 2015-12-28 (×15): 40 mg via ORAL
  Filled 2015-12-21 (×15): qty 1

## 2015-12-21 NOTE — Progress Notes (Signed)
2 Days Post-Op  Subjective: Reports that while overall her nausea is improving, states she still becomes quite nauseated and often vomits when taking oral antibiotics. Does endorse that she often takes Flagyl on an empty stomach. Nausea relieved this morning with Phenergan. Multiple soft BMs over night. No issues with UO.   Objective: Vital signs in last 24 hours: Temp:  [98 F (36.7 C)-99.3 F (37.4 C)] 98.7 F (37.1 C) (10/13 0518) Pulse Rate:  [89-93] 90 (10/13 0518) Resp:  [17-22] 17 (10/13 0518) BP: (135-163)/(77-85) 146/82 (10/13 0518) SpO2:  [93 %-98 %] 93 % (10/13 0518) Last BM Date: 12/20/15  Intake/Output from previous day: 10/12 0701 - 10/13 0700 In: 540 [P.O.:540] Out: 955 [Drains:955] Intake/Output this shift: Total I/O In: 120 [P.O.:120] Out: -   PE: General: pleasant, WD, WN white female who is seated in chair in NAD HEENT: head is normocephalic, atraumatic.  Sclera are noninjected. Mouth is pink and moist Heart: regular, rate, and rhythm.   Lungs: CTAB, no wheezes, rhonchi, or rales noted.  Respiratory effort nonlabored Abd: soft, NT, ND, +BS. appropriately tender around drain sites. drain sites and incisions C/D/I. no peritoneal signs. Total drain output over past 24 hours recorded as 919m.  MS: all 4 extremities are symmetrical with no cyanosis, clubbing, or edema. Skin: warm and dry with no masses, lesions, or rashes Psych: A&Ox3 with an appropriate affect.  Lab Results:   Recent Labs  12/20/15 0518 12/21/15 0502  WBC 12.4* 11.8*  HGB 11.3* 10.6*  HCT 35.1* 32.8*  PLT 649* 498*   BMET  Recent Labs  12/20/15 0518 12/21/15 0502  NA 139 141  K 4.0 3.6  CL 103 105  CO2 25 26  GLUCOSE 144* 128*  BUN <5* <5*  CREATININE 0.82 0.82  CALCIUM 8.3* 8.0*   PT/INR No results for input(s): LABPROT, INR in the last 72 hours. CMP     Component Value Date/Time   NA 141 12/21/2015 0502   K 3.6 12/21/2015 0502   CL 105 12/21/2015 0502   CO2 26  12/21/2015 0502   GLUCOSE 128 (H) 12/21/2015 0502   BUN <5 (L) 12/21/2015 0502   CREATININE 0.82 12/21/2015 0502   CREATININE 1.17 (H) 07/13/2015 1056   CALCIUM 8.0 (L) 12/21/2015 0502   PROT 5.4 (L) 12/21/2015 0502   ALBUMIN 2.2 (L) 12/21/2015 0502   AST 30 12/21/2015 0502   ALT 22 12/21/2015 0502   ALKPHOS 180 (H) 12/21/2015 0502   BILITOT 0.2 (L) 12/21/2015 0502   GFRNONAA >60 12/21/2015 0502   GFRNONAA 50 (L) 07/13/2015 1056   GFRAA >60 12/21/2015 0502   GFRAA 57 (L) 07/13/2015 1056   Lipase     Component Value Date/Time   LIPASE 47 12/16/2015 0241       Studies/Results: No results found.  Anti-infectives: Anti-infectives    Start     Dose/Rate Route Frequency Ordered Stop   12/14/15 1445  cefTRIAXone (ROCEPHIN) 2 g in dextrose 5 % 50 mL IVPB     2 g 100 mL/hr over 30 Minutes Intravenous Every 24 hours 12/14/15 1435     12/14/15 1445  metroNIDAZOLE (FLAGYL) tablet 500 mg     500 mg Oral Every 8 hours 12/14/15 1435     12/12/15 0930  vancomycin (VANCOCIN) IVPB 1000 mg/200 mL premix  Status:  Discontinued     1,000 mg 200 mL/hr over 60 Minutes Intravenous Every 12 hours 12/12/15 0913 12/14/15 1435   12/11/15 0800  piperacillin-tazobactam (ZOSYN)  IVPB 3.375 g  Status:  Discontinued     3.375 g 12.5 mL/hr over 240 Minutes Intravenous Every 8 hours 12/11/15 0536 12/14/15 1435   12/11/15 0045  piperacillin-tazobactam (ZOSYN) IVPB 3.375 g     3.375 g 100 mL/hr over 30 Minutes Intravenous  Once 12/11/15 0038 12/11/15 0339       Assessment/Plan S/P Procedure(s):ENDOSCOPIC RETROGRADE CHOLANGIOPANCREATOGRAPHY (ERCP) BILIARY STENT PLACEMENT For post-op bile leak s/p lac chole w/ IOC 12/06/15, Dr. Barry Dienes - RUQ drain placed 12/12/15 - culture: viridans streptococcus, started vanc 12/12/15. Per ID service, recommend IV rocephin and oral flagyl through 12/24/15 and then DC antibiotics OR if patient is against doing IV then she could go home on oral levaquin and oral flagyl  through the 16th (would like to avoid FQ due to concerns for Clostridium difficile) - second, perihepatic drain placed 12/17/15. Total drain output 963m over past 24 hours.  - Alk phos elevation improving (down to 180 today) - Leukocytosis improving (WBCs 11.8 today) and remains afebrile. - 10/11 added octreotide in an effort to reduce bilious output. Will also D/C pepcid today and begin PPI (Protonix) at 479mpo BID.   Lower extremity edema: Lasix 20 mg daily  TEE- negative for endocarditis  FEN: as tolerated, Boost BID ID:see above VTE: SCD's, Lovenox, ambulation   Plan:continue non-operative management and diet as tolerated + Boost. Zofran PRN for nausea.  continue IV abx per ID, AM labs, monitor drain output. Will discuss discharge planning with Dr. WiRedmond Pulling  LOS: 10 days    LEE Lillias Difrancesco, [facility] 12/21/2015, 9:24 AM

## 2015-12-21 NOTE — Progress Notes (Signed)
Patient vomited an unknown amount last night after receiving her nighttime meds at 2322. Patient was nauseated prior to receiving meds, so she was medicated with 6.25mg  IV Phenergan at 2230. Allowed pt to wait before taking oral meds so Phenergan could take effect, but she vomited anyway.  She had received Zofran at 1900.  Medicated her again with Zofran after she vomited.  Patient is sleeping at this time.

## 2015-12-21 NOTE — Progress Notes (Signed)
Referring Physician(s): Dr Mamie Laurel  Supervising Physician: Aletta Edouard  Patient Status:  [facility] - In-pt  Chief Complaint:  Cholecystectomy Biloma drain placed 10/3 ERCP stent 10/4 Pancreatitis New biloma drain 10/9  Subjective:  [name] is feeling better. She tells me the output from her drain is much less than yesterday.   Allergies: Shellfish allergy  Medications: Prior to Admission medications   Medication Sig Start Date End Date Taking? Authorizing Provider  atorvastatin (LIPITOR) 40 MG tablet Take 1 tablet (40 mg total) by mouth daily. 01/04/15  Yes Elberta Leatherwood, MD  clopidogrel (PLAVIX) 75 MG tablet TAKE 1 TABLET BY MOUTH EVERY DAY 09/04/15  Yes Elberta Leatherwood, MD  doxepin (SINEQUAN) 100 MG capsule Take 2 capsules (200 mg total) by mouth at bedtime. 10/15/14  Yes Kathrynn Ducking, MD  furosemide (LASIX) 20 MG tablet Take 10 mg by mouth daily. 05/09/14  Yes Historical Provider, MD  gabapentin (NEURONTIN) 600 MG tablet Take 1 tablet (600 mg total) by mouth 2 (two) times daily. 12/05/15  Yes Charlett Blake, MD  lidocaine (LIDODERM) 5 % Place 2 patches onto the skin daily. Remove & Discard patch within 12 hours or as directed by MD 11/28/15  Yes Charlett Blake, MD  meclizine (ANTIVERT) 25 MG tablet Take 1 tablet (25 mg total) by mouth 2 (two) times daily as needed for dizziness. 07/16/15  Yes Elberta Leatherwood, MD  ondansetron (ZOFRAN-ODT) 4 MG disintegrating tablet Take 1 tablet (4 mg total) by mouth every 6 (six) hours as needed for nausea. 12/08/15  Yes Jerrye Beavers, PA-C  oxyCODONE (OXY IR/ROXICODONE) 5 MG immediate release tablet Take 1-2 tablets (5-10 mg total) by mouth every 4 (four) hours as needed for moderate pain. 12/08/15  Yes Brooke A Miller, PA-C  ramipril (ALTACE) 2.5 MG capsule TAKE ONE CAPSULE BY MOUTH EVERY DAY 10/01/15  Yes Elberta Leatherwood, MD  tiZANidine (ZANAFLEX) 2 MG tablet Take 1 tablet (2 mg total) by mouth 3 (three) times daily. 09/12/15  Yes Ward Givens, NP  topiramate (TOPAMAX) 100 MG tablet TAKE 1 TABLET TWICE A DAY 05/07/15  Yes Kathrynn Ducking, MD  sertraline (ZOLOFT) 50 MG tablet TAKE 1 TABLET (50 MG TOTAL) BY MOUTH 2 (TWO) TIMES DAILY. 12/13/15   Elberta Leatherwood, MD     Vital Signs: BP (!) 146/82 (BP Location: Left Arm)   Pulse 90   Temp 98.7 F (37.1 C) (Oral)   Resp 17   Ht 5\' 7"  (1.702 m)   Wt 166 lb (75.3 kg)   SpO2 93%   BMI 26.00 kg/m   Physical Exam Awake and alert NAD Sitting up in bed Both drains in place, intact 2nd drain now with 850 ml output (2 liters yesterday) 1st drain with 100 ml output. Both bilious  Imaging: Ir Sinus/fist Tube Chk-non Gi  Result Date: 12/17/2015 CLINICAL DATA:  Bile leak post cholecystectomy despite endoscopic stent. Pigtail drain catheter was placed in the gallbladder fossa but there is continued output. EXAM: ABDOMINAL DRAIN CATHETER INJECTION UNDER FLUOROSCOPY TECHNIQUE: The procedure, risks (including but not limited to bleeding, infection, organ damage ), benefits, and alternatives were explained to the patient. Questions regarding the procedure were encouraged and answered. The patient understands and consents to the procedure. Survey fluoroscopic inspection reveals stable position of the pigtail drain catheter in the gallbladder fossa. Injection demonstrates patency of the drain catheter. There is a small residual cavity. With slight distention, there is some  reflux of contrast into the intrahepatic biliary tree. With aspiration, the cavity is completely evacuated. IMPRESSION: 1. Continued biliary leak, draining into the gallbladder fossa, well controlled by the percutaneous drain catheter. Catheter was left in place, returned to external drainage. Electronically Signed   By: Lucrezia Europe M.D.   On: 12/17/2015 16:47   Ir Guided Niel Hummer W Catheter Placement  Result Date: 12/17/2015 CLINICAL DATA:  Bile leak post cholecystectomy despite endoscopic stent placement. Continued output  from gallbladder fossa drain. 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 EXAM: ULTRASOUND AND FLUOROSCOPIC GUIDED DRAINAGE OF PERIHEPATIC BILOMA ANESTHESIA/SEDATION: Intravenous Fentanyl and Versed were administered as conscious sedation during continuous monitoring of the patient's level of consciousness and physiological / cardiorespiratory status by the radiology RN, with a total moderate sedation time of 10 minutes. PROCEDURE: The procedure, risks, benefits, and alternatives were explained to the patient. Questions regarding the procedure were encouraged and answered. The patient understands and consents to the procedure. 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 The operative field was prepped with chlorhexidinein a sterile fashion, and a sterile drape was applied covering the operative field. A sterile gown and sterile gloves were used for the procedure. Local anesthesia was provided with 1% Lidocaine. Under real-time ultrasound guidance, a 21 gauge micropuncture needle was advanced into the collection. Thin yellow-green clear fluid could be aspirated. An 018 guidewire advanced centrally within the collection, its position confirmed on fluoroscopy. This was exchanged using the transitional dilator for short Amplatz wire, and the tract was dilated to facilitate placement of a 12 French pigtail catheter, formed centrally within the collection. Small contrast injection under fluoro confirms appropriate positioning and patency. Catheter secured externally with 0 Prolene suture and StatLock and placed to gravity drain bag. The patient tolerated the procedure well. COMPLICATIONS: None immediate FINDINGS: The right perihepatic subphrenic biloma was again localized. Twelve French pigtail drain catheter placed under ultrasound and fluoroscopic guidance as above. IMPRESSION: 1. Technically  successful perihepatic subphrenic biloma drain catheter placement under ultrasound and fluoroscopic guidance. Electronically Signed   By: Lucrezia Europe M.D.   On: 12/17/2015 16:45   Ir US Guide Bx Asp/drain  Result Date: 12/17/2015 CLINICAL DATA:  Bile leak post cholecystectomy despite endoscopic stent placement. Continued output from gallbladder fossa drain. 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 EXAM: ULTRASOUND AND FLUOROSCOPIC GUIDED DRAINAGE OF PERIHEPATIC BILOMA ANESTHESIA/SEDATION: Intravenous Fentanyl and Versed were administered as conscious sedation during continuous monitoring of the patient's level of consciousness and physiological / cardiorespiratory status by the radiology RN, with a total moderate sedation time of 10 minutes. PROCEDURE: The procedure, risks, benefits, and alternatives were explained to the patient. Questions regarding the procedure were encouraged and answered. The patient understands and consents to the procedure. 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 The operative field was prepped with chlorhexidinein a sterile fashion, and a sterile drape was applied covering the operative field. A sterile gown and sterile gloves were used for the procedure. Local anesthesia was provided with 1% Lidocaine. Under real-time ultrasound guidance, a 21 gauge micropuncture needle was advanced into the collection. Thin yellow-green clear fluid could be aspirated. An 018 guidewire advanced centrally within the collection, its position confirmed on fluoroscopy. This was exchanged using the transitional dilator for short Amplatz wire, and the tract was dilated to facilitate placement of a  12 French pigtail catheter, formed centrally within the collection. Small contrast injection under fluoro confirms appropriate positioning and patency. Catheter secured externally  with 0 Prolene suture and StatLock and placed to gravity drain bag. The patient tolerated the procedure well. COMPLICATIONS: None immediate FINDINGS: The right perihepatic subphrenic biloma was again localized. Twelve French pigtail drain catheter placed under ultrasound and fluoroscopic guidance as above. IMPRESSION: 1. Technically successful perihepatic subphrenic biloma drain catheter placement under ultrasound and fluoroscopic guidance. Electronically Signed   By: Lucrezia Europe M.D.   On: 12/17/2015 16:45    Labs:  CBC:  Recent Labs  12/18/15 0849 12/19/15 0339 12/20/15 0518 12/21/15 0502  WBC 10.3 12.1* 12.4* 11.8*  HGB 10.9* 10.8* 11.3* 10.6*  HCT 34.5* 34.2* 35.1* 32.8*  PLT 597* 640* 649* 498*    COAGS:  Recent Labs  12/05/15 0406 12/11/15 0729 12/17/15 0447  INR 1.03 1.10 1.42  APTT 33  --   --     BMP:  Recent Labs  12/16/15 0241 12/19/15 0339 12/20/15 0518 12/21/15 0502  NA 138 139 139 141  K 4.1 4.3 4.0 3.6  CL 103 106 103 105  CO2 25 23 25 26   GLUCOSE 98 115* 144* 128*  BUN <5* <5* <5* <5*  CALCIUM 8.2* 8.3* 8.3* 8.0*  CREATININE 0.71 0.78 0.82 0.82  GFRNONAA >60 >60 >60 >60  GFRAA >60 >60 >60 >60    LIVER FUNCTION TESTS:  Recent Labs  12/16/15 0241 12/19/15 0339 12/20/15 0518 12/21/15 0502  BILITOT 0.4 0.3 0.3 0.2*  AST 45* 50* 41 30  ALT 36 32 31 22  ALKPHOS 132* 207* 251* 180*  PROT 5.3* 5.6* 6.0* 5.4*  ALBUMIN 1.8* 2.1* 2.4* 2.2*    Assessment and Plan:  Cholecystectomy Biloma drain placed 10/3 ERCP stent 10/4 Pancreatitis New biloma drain 10/9  Continue routine drain care, empty and record q shift Will continue to follow   Electronically Signed: Murrell Redden PA-C 12/21/2015, 12:39 PM   I spent a total of 15 Minutes at the the patient's bedside AND on the patient's hospital floor or unit, greater than 50% of which was counseling/coordinating care for f/u drain

## 2015-12-22 MED ORDER — ONDANSETRON HCL 4 MG/2ML IJ SOLN: 4.0000 mg | Freq: Four times a day (QID) | INTRAMUSCULAR | Status: DC | PRN

## 2015-12-22 MED ORDER — ONDANSETRON 4 MG PO TBDP
4.0000 mg | ORAL_TABLET | Freq: Four times a day (QID) | ORAL | Status: DC
  Administered 2015-12-22 – 2015-12-28 (×22): 4 mg via ORAL
  Filled 2015-12-22 (×23): qty 1

## 2015-12-22 NOTE — Progress Notes (Signed)
Patient c/o sharp pains in flank area on right side. Heat and robaxain was given. No c/o of burning or pain when urinating.

## 2015-12-22 NOTE — Progress Notes (Signed)
3 Days Post-Op  Subjective: Still with nausea with flagyl. O/w ok. Appetite ok but not great.   Objective: Vital signs in last 24 hours: Temp:  [98.7 F (37.1 C)-99 F (37.2 C)] 98.7 F (37.1 C) (10/14 0516) Pulse Rate:  [86-109] 89 (10/14 0516) Resp:  [18] 18 (10/14 0516) BP: (135-151)/(77-90) 146/77 (10/14 0516) SpO2:  [93 %-95 %] 93 % (10/14 0516) Last BM Date: 12/21/15  Intake/Output from previous day: 10/13 0701 - 10/14 0700 In: 440 [P.O.:420] Out: 1550 [Urine:700; Drains:850] Intake/Output this shift: No intake/output data recorded.  Alert, resting comfortably cta Reg Soft, mild RUQ ttp; drain - bile No edema  Lab Results:   Recent Labs  12/20/15 0518 12/21/15 0502  WBC 12.4* 11.8*  HGB 11.3* 10.6*  HCT 35.1* 32.8*  PLT 649* 498*   BMET  Recent Labs  12/20/15 0518 12/21/15 0502  NA 139 141  K 4.0 3.6  CL 103 105  CO2 25 26  GLUCOSE 144* 128*  BUN <5* <5*  CREATININE 0.82 0.82  CALCIUM 8.3* 8.0*   PT/INR No results for input(s): LABPROT, INR in the last 72 hours. ABG No results for input(s): PHART, HCO3 in the last 72 hours.  Invalid input(s): PCO2, PO2  Studies/Results: No results found.  Anti-infectives: Anti-infectives    Start     Dose/Rate Route Frequency Ordered Stop   12/14/15 1445  cefTRIAXone (ROCEPHIN) 2 g in dextrose 5 % 50 mL IVPB     2 g 100 mL/hr over 30 Minutes Intravenous Every 24 hours 12/14/15 1435     12/14/15 1445  metroNIDAZOLE (FLAGYL) tablet 500 mg     500 mg Oral Every 8 hours 12/14/15 1435     12/12/15 0930  vancomycin (VANCOCIN) IVPB 1000 mg/200 mL premix  Status:  Discontinued     1,000 mg 200 mL/hr over 60 Minutes Intravenous Every 12 hours 12/12/15 0913 12/14/15 1435   12/11/15 0800  piperacillin-tazobactam (ZOSYN) IVPB 3.375 g  Status:  Discontinued     3.375 g 12.5 mL/hr over 240 Minutes Intravenous Every 8 hours 12/11/15 0536 12/14/15 1435   12/11/15 0045  piperacillin-tazobactam (ZOSYN) IVPB 3.375 g      3.375 g 100 mL/hr over 30 Minutes Intravenous  Once 12/11/15 0038 12/11/15 0339      Assessment/Plan: S/P Procedure(s):ENDOSCOPIC RETROGRADE CHOLANGIOPANCREATOGRAPHY (ERCP) BILIARY STENT PLACEMENT For post-op bile leak s/p lac chole w/ IOC 12/06/15, Dr. Barry Dienes - RUQ drain placed 12/12/15 - culture: viridans streptococcus, started vanc 12/12/15. Per ID service, recommend IV rocephin and oral flagyl through 12/24/15 and then DC antibiotics OR if patient is against doing IV then she could go home on oral levaquin and oral flagyl through the 16th (would like to avoid FQ due to concerns for Clostridium difficile) - second, perihepatic drain placed 12/17/15. Total drain output 832ml over past 24 hours.(day before 955) so slowly decreasing.   lfts and cbc stable - 10/11 added octreotide in an effort to reduce bilious output. Will also D/C pepcid today and begin PPI (Protonix) at 40mg  po BID.   Lower extremity edema: Lasix 20 mg daily, resolved.   TEE- negative for endocarditis  FEN: as tolerated, Boost BID ID:see above VTE: SCD's, Lovenox, ambulation Will schedule zofran Son at [facility] Slowly getting better  Leighton Ruff. Redmond Pulling, MD, FACS General, Bariatric, & Minimally Invasive Surgery [facility], [place]   LOS: 11 days    [name] 12/22/2015

## 2015-12-23 NOTE — Progress Notes (Signed)
4 Days Post-Op  Subjective: Pt with no c/o this AM  Objective: Vital signs in last 24 hours: Temp:  [98.2 F (36.8 C)-98.7 F (37.1 C)] 98.2 F (36.8 C) (10/15 0532) Pulse Rate:  [86-95] 95 (10/15 0532) Resp:  [16-18] 18 (10/15 0532) BP: (128-155)/(73-83) 137/83 (10/15 0532) SpO2:  [92 %-95 %] 92 % (10/15 0532) Last BM Date: 12/22/15  Intake/Output from previous day: 10/14 0701 - 10/15 0700 In: 1400 [P.O.:1240; IV Piggyback:150] Out: 2260 [Urine:1350; Drains:910] Intake/Output this shift: Total I/O In: 120 [P.O.:120] Out: 125 [Drains:125]  General appearance: alert and cooperative GI: soft, non-tender; bowel sounds normal; no masses,  no organomegaly and JPs bilous, A<B  Lab Results:   Recent Labs  12/21/15 0502  WBC 11.8*  HGB 10.6*  HCT 32.8*  PLT 498*   BMET  Recent Labs  12/21/15 0502  NA 141  K 3.6  CL 105  CO2 26  GLUCOSE 128*  BUN <5*  CREATININE 0.82  CALCIUM 8.0*   PT/INR No results for input(s): LABPROT, INR in the last 72 hours. ABG No results for input(s): PHART, HCO3 in the last 72 hours.  Invalid input(s): PCO2, PO2  Studies/Results: No results found.  Anti-infectives: Anti-infectives    Start     Dose/Rate Route Frequency Ordered Stop   12/14/15 1445  cefTRIAXone (ROCEPHIN) 2 g in dextrose 5 % 50 mL IVPB     2 g 100 mL/hr over 30 Minutes Intravenous Every 24 hours 12/14/15 1435     12/14/15 1445  metroNIDAZOLE (FLAGYL) tablet 500 mg     500 mg Oral Every 8 hours 12/14/15 1435     12/12/15 0930  vancomycin (VANCOCIN) IVPB 1000 mg/200 mL premix  Status:  Discontinued     1,000 mg 200 mL/hr over 60 Minutes Intravenous Every 12 hours 12/12/15 0913 12/14/15 1435   12/11/15 0800  piperacillin-tazobactam (ZOSYN) IVPB 3.375 g  Status:  Discontinued     3.375 g 12.5 mL/hr over 240 Minutes Intravenous Every 8 hours 12/11/15 0536 12/14/15 1435   12/11/15 0045  piperacillin-tazobactam (ZOSYN) IVPB 3.375 g     3.375 g 100 mL/hr over 30  Minutes Intravenous  Once 12/11/15 0038 12/11/15 [id number]      Assessment/Plan: S/PProcedure(s):ENDOSCOPIC RETROGRADE CHOLANGIOPANCREATOGRAPHY (ERCP) BILIARY STENT PLACEMENT For post-op bile leak s/p lac chole w/ IOC 12/06/15, Dr. Barry Dienes - RUQ drain placed 12/12/15 - culture: viridans streptococcus, started vanc 12/12/15. Per ID service, recommendIV rocephin and oral flagyl through 12/24/15 and then DC antibiotics OR if patient is against doing IV then she could go home on oral levaquin and oral flagyl through the 16th (wouldlike to avoid FQ due to concerns for Clostridium difficile) - second, perihepatic drain placed 12/17/15. Total drain output 914ml over past 24 hours.(day before 850cc so stable, but had been decreasing throughout the week.   lfts and cbc stable - 10/11 added octreotide in an effort to reduce bilious output. Will also D/C pepcid today and begin PPI (Protonix) at 40mg  po BID.   Lower extremity edema: Lasix 20 mg daily, resolved.   TEE- negative for endocarditis  FEN: as tolerated, Boost BID ID:see above VTE: SCD's, Lovenox, ambulation Will schedule zofran Son at [facility] Slowly getting better.  If drain output doesn't con't to decrease, pt may benefit from up sizing the ERCP stent.   LOS: 12 days    Rosario Jacks., [facility] 12/23/2015

## 2015-12-23 NOTE — Progress Notes (Signed)
After talking with Pt. She and her husband wondered if Sandostatin IV dose is the cause of the nausea and not the Flagyl. Pt says she is unable to take Flagyl po. Call to DR. Rosendo Gros, He says to hold Sandostatin and give Flagyl PO.

## 2015-12-24 MED ORDER — ALPRAZOLAM 0.25 MG PO TABS
0.2500 mg | ORAL_TABLET | Freq: Once | ORAL | Status: AC
  Administered 2015-12-24: 0.25 mg via ORAL
  Filled 2015-12-24: qty 1

## 2015-12-24 NOTE — Progress Notes (Signed)
Patient ID: [name], female   DOB: Aug 29, 1950, 65 y.o.   MRN: [id number]    Referring Physician(s): Dr. Romana Juniper  Supervising Physician: Aletta Edouard  Patient Status: inpt  Chief Complaint: biloma  Subjective: Patient feeling better, but still having lots of drain output.  Allergies: Shellfish allergy  Medications: Prior to Admission medications   Medication Sig Start Date End Date Taking? Authorizing Provider  atorvastatin (LIPITOR) 40 MG tablet Take 1 tablet (40 mg total) by mouth daily. 01/04/15  Yes Elberta Leatherwood, MD  clopidogrel (PLAVIX) 75 MG tablet TAKE 1 TABLET BY MOUTH EVERY DAY 09/04/15  Yes Elberta Leatherwood, MD  doxepin (SINEQUAN) 100 MG capsule Take 2 capsules (200 mg total) by mouth at bedtime. 10/15/14  Yes Kathrynn Ducking, MD  furosemide (LASIX) 20 MG tablet Take 10 mg by mouth daily. 05/09/14  Yes Historical Provider, MD  gabapentin (NEURONTIN) 600 MG tablet Take 1 tablet (600 mg total) by mouth 2 (two) times daily. 12/05/15  Yes Charlett Blake, MD  lidocaine (LIDODERM) 5 % Place 2 patches onto the skin daily. Remove & Discard patch within 12 hours or as directed by MD 11/28/15  Yes Charlett Blake, MD  meclizine (ANTIVERT) 25 MG tablet Take 1 tablet (25 mg total) by mouth 2 (two) times daily as needed for dizziness. 07/16/15  Yes Elberta Leatherwood, MD  ondansetron (ZOFRAN-ODT) 4 MG disintegrating tablet Take 1 tablet (4 mg total) by mouth every 6 (six) hours as needed for nausea. 12/08/15  Yes Jerrye Beavers, PA-C  oxyCODONE (OXY IR/ROXICODONE) 5 MG immediate release tablet Take 1-2 tablets (5-10 mg total) by mouth every 4 (four) hours as needed for moderate pain. 12/08/15  Yes Brooke A Miller, PA-C  ramipril (ALTACE) 2.5 MG capsule TAKE ONE CAPSULE BY MOUTH EVERY DAY 10/01/15  Yes Elberta Leatherwood, MD  tiZANidine (ZANAFLEX) 2 MG tablet Take 1 tablet (2 mg total) by mouth 3 (three) times daily. 09/12/15  Yes Ward Givens, NP  topiramate (TOPAMAX) 100 MG tablet TAKE 1  TABLET TWICE A DAY 05/07/15  Yes Kathrynn Ducking, MD  sertraline (ZOLOFT) 50 MG tablet TAKE 1 TABLET (50 MG TOTAL) BY MOUTH 2 (TWO) TIMES DAILY. 12/13/15   Elberta Leatherwood, MD    Vital Signs: BP 123/72 (BP Location: Left Arm)   Pulse 91   Temp 98.7 F (37.1 C) (Oral)   Resp 18   Ht 5\' 7"  (1.702 m)   Wt 166 lb (75.3 kg)   SpO2 94%   BMI 26.00 kg/m   Physical Exam: Abd: 2 RUQ drains in place both with bilious output.  Drain 2 with 480cc and drain 1 25cc. Both sites are c/d/i  Imaging: No results found.  Labs:  CBC:  Recent Labs  12/18/15 0849 12/19/15 0339 12/20/15 0518 12/21/15 0502  WBC 10.3 12.1* 12.4* 11.8*  HGB 10.9* 10.8* 11.3* 10.6*  HCT 34.5* 34.2* 35.1* 32.8*  PLT 597* 640* 649* 498*    COAGS:  Recent Labs  12/05/15 0406 12/11/15 0729 12/17/15 0447  INR 1.03 1.10 1.42  APTT 33  --   --     BMP:  Recent Labs  12/16/15 0241 12/19/15 0339 12/20/15 0518 12/21/15 0502  NA 138 139 139 141  K 4.1 4.3 4.0 3.6  CL 103 106 103 105  CO2 25 23 25 26   GLUCOSE 98 115* 144* 128*  BUN <5* <5* <5* <5*  CALCIUM 8.2* 8.3* 8.3* 8.0*  CREATININE  0.71 0.78 0.82 0.82  GFRNONAA >60 >60 >60 >60  GFRAA >60 >60 >60 >60    LIVER FUNCTION TESTS:  Recent Labs  12/16/15 0241 12/19/15 0339 12/20/15 0518 12/21/15 0502  BILITOT 0.4 0.3 0.3 0.2*  AST 45* 50* 41 30  ALT 36 32 31 22  ALKPHOS 132* 207* 251* 180*  PROT 5.3* 5.6* 6.0* 5.4*  ALBUMIN 1.8* 2.1* 2.4* 2.2*    Assessment and Plan: 1. Biloma s/p lap chole, s/p drain placements on 10/3, 10/9 -both drains are intact and stable -cont routine drain care -will follow  Electronically Signed: Kylie Gros E 12/24/2015, 2:08 PM   I spent a total of 15 Minutes at the the patient's bedside AND on the patient's hospital floor or unit, greater than 50% of which was counseling/coordinating care for biloma

## 2015-12-24 NOTE — Progress Notes (Signed)
Pt. tol. both Sandostatin and Flagyl after being pre-med with Zofran.

## 2015-12-24 NOTE — Progress Notes (Addendum)
Patient ID: [name], female   DOB: 10-06-1950, 65 y.o.   MRN: [id number]  [facility] Surgery Progress Note  5 Days Post-Op  Subjective: Did have one episode of emesis yesterday, feeling better today. Thinks it was related to octreotide. Passing flatus.   Objective: Vital signs in last 24 hours: Temp:  [98.2 F (36.8 C)-98.9 F (37.2 C)] 98.7 F (37.1 C) (10/16 0549) Pulse Rate:  [93-97] 97 (10/16 0549) Resp:  [18] 18 (10/16 0549) BP: (134-142)/(64-82) 134/74 (10/16 0549) SpO2:  [93 %-96 %] 96 % (10/16 0549) Last BM Date: 12/23/15  Intake/Output from previous day: 10/15 0701 - 10/16 0700 In: 180 [P.O.:120; IV Piggyback:50] Out: 506 [Urine:1; Drains:505] Intake/Output this shift: No intake/output data recorded.  PE: Gen:  Alert, NAD, pleasant Pulm:  CTAB Abd: Soft, NT/ND, +BS, drain sites cdi  RUQ drain 480cc/24hr R abd drain 25cc/24hr  Lab Results:  No results for input(s): WBC, HGB, HCT, PLT in the last 72 hours. BMET No results for input(s): NA, K, CL, CO2, GLUCOSE, BUN, CREATININE, CALCIUM in the last 72 hours. PT/INR No results for input(s): LABPROT, INR in the last 72 hours. CMP     Component Value Date/Time   NA 141 12/21/2015 0502   K 3.6 12/21/2015 0502   CL 105 12/21/2015 0502   CO2 26 12/21/2015 0502   GLUCOSE 128 (H) 12/21/2015 0502   BUN <5 (L) 12/21/2015 0502   CREATININE 0.82 12/21/2015 0502   CREATININE 1.17 (H) 07/13/2015 1056   CALCIUM 8.0 (L) 12/21/2015 0502   PROT 5.4 (L) 12/21/2015 0502   ALBUMIN 2.2 (L) 12/21/2015 0502   AST 30 12/21/2015 0502   ALT 22 12/21/2015 0502   ALKPHOS 180 (H) 12/21/2015 0502   BILITOT 0.2 (L) 12/21/2015 0502   GFRNONAA >60 12/21/2015 0502   GFRNONAA 50 (L) 07/13/2015 1056   GFRAA >60 12/21/2015 0502   GFRAA 57 (L) 07/13/2015 1056   Lipase     Component Value Date/Time   LIPASE 47 12/16/2015 0241       Studies/Results: No results found.  Anti-infectives: Anti-infectives    Start      Dose/Rate Route Frequency Ordered Stop   12/14/15 1445  cefTRIAXone (ROCEPHIN) 2 g in dextrose 5 % 50 mL IVPB     2 g 100 mL/hr over 30 Minutes Intravenous Every 24 hours 12/14/15 1435     12/14/15 1445  metroNIDAZOLE (FLAGYL) tablet 500 mg     500 mg Oral Every 8 hours 12/14/15 1435     12/12/15 0930  vancomycin (VANCOCIN) IVPB 1000 mg/200 mL premix  Status:  Discontinued     1,000 mg 200 mL/hr over 60 Minutes Intravenous Every 12 hours 12/12/15 0913 12/14/15 1435   12/11/15 0800  piperacillin-tazobactam (ZOSYN) IVPB 3.375 g  Status:  Discontinued     3.375 g 12.5 mL/hr over 240 Minutes Intravenous Every 8 hours 12/11/15 0536 12/14/15 1435   12/11/15 0045  piperacillin-tazobactam (ZOSYN) IVPB 3.375 g     3.375 g 100 mL/hr over 30 Minutes Intravenous  Once 12/11/15 0038 12/11/15 0339       Assessment/Plan S/p Procedure(s): ENDOSCOPIC RETROGRADE CHOLANGIOPANCREATOGRAPHY (ERCP),  BILIARY STENT PLACEMENT (10 F) on 12/12/2015 by Dr. Loletha Carrow  For post-op bile leak s/p lac chole w/ IOC 12/06/15, Dr. Barry Dienes - RUQ drain placed 12/12/15. Total drain output 25cc/24hr - culture: viridans streptococcus, started vanc 12/12/15. Per ID service, recommend IV rocephin and oral flagyl through 12/24/15 and then DC antibiotics OR if patient  is against doing IV then she could go home on oral levaquin and oral flagyl through the 16th (would like to avoid FQ due to concerns for Clostridium difficile) - second, perihepatic drain placed 12/17/15. Total drain output 480cc/24hr (slowly decreasing) - afebrile - 10/11 added octreotide  TEE- negative for endocarditis  FEN: as tolerated, Boost BID ID: Zosyn 10/3>>10/5, vanc 10/4>>10/6,   Rocephin 10/6 >>, Flagyl 10/6 >> VTE: SCD's, Lovenox   Plan: drain output continues to decrease, 480cc/24hr. Continue octreotide; patient concerned that this medication is making her nauseated and causing leg spasms; will discuss with MD if there is an alternative.   Continue  scheduled zofran.   Per ID will stop flagyl after today.   Labs in AM. Monitor drain output.   LOS: 13 days    Jerrye Beavers , [facility] Surgery 12/24/2015, 8:42 AM Pager: [phone] Consults: [phone] Mon-Fri 7:00 am-4:30 pm Sat-Sun 7:00 am-11:30 am  Agree with above. Husband in the room.  The stent is 67 F - which ought to be big enough to drain the bile. Has not turned corner. Having trouble with IV's.  Will order PICC. She felt better when she was off the octreotide.  I'm not sure how much that is helping, but will stop for now.  Alphonsa Overall, MD, [facility] Surgery Pager: [phone] Office phone:  [phone]

## 2015-12-24 NOTE — Progress Notes (Signed)
Discuss IV options with Pt.  Reluctant to have PICC insertion.  Attempted powerglide insertion x 2 unsuccessfully.  PIV placed Rt. Hand.  PICC team will discuss PICC in am with Pt.

## 2015-12-25 LAB — COMPREHENSIVE METABOLIC PANEL
ALT: 18 U/L (ref 14–54)
AST: 24 U/L (ref 15–41)
Albumin: 2.4 g/dL — ABNORMAL LOW (ref 3.5–5.0)
Alkaline Phosphatase: 137 U/L — ABNORMAL HIGH (ref 38–126)
Anion gap: 10 (ref 5–15)
BUN: 7 mg/dL (ref 6–20)
CO2: 27 mmol/L (ref 22–32)
Calcium: 8 mg/dL — ABNORMAL LOW (ref 8.9–10.3)
Chloride: 100 mmol/L — ABNORMAL LOW (ref 101–111)
Creatinine, Ser: 0.78 mg/dL (ref 0.44–1.00)
GFR calc Af Amer: >60 mL/min (ref >60)
GFR calc non Af Amer: >60 mL/min (ref >60)
Glucose, Bld: 121 mg/dL — ABNORMAL HIGH (ref 65–99)
Potassium: 3 mmol/L — ABNORMAL LOW (ref 3.5–5.1)
Sodium: 137 mmol/L (ref 135–145)
Total Bilirubin: 0.4 mg/dL (ref 0.3–1.2)
Total Protein: 5.6 g/dL — ABNORMAL LOW (ref 6.5–8.1)

## 2015-12-25 LAB — CBC
HCT: 36.1 % (ref 36.0–46.0)
Hemoglobin: 11.5 g/dL — ABNORMAL LOW (ref 12.0–15.0)
MCH: 28.3 pg (ref 26.0–34.0)
MCHC: 31.9 g/dL (ref 30.0–36.0)
MCV: 88.7 fL (ref 78.0–100.0)
Platelets: 364 10*3/uL (ref 150–400)
RBC: 4.07 MIL/uL (ref 3.87–5.11)
RDW: 15.5 % (ref 11.5–15.5)
WBC: 12.8 10*3/uL — ABNORMAL HIGH (ref 4.0–10.5)

## 2015-12-25 MED ORDER — ALPRAZOLAM 0.25 MG PO TABS
0.2500 mg | ORAL_TABLET | Freq: Two times a day (BID) | ORAL | Status: DC | PRN
  Administered 2015-12-25 – 2015-12-27 (×3): 0.25 mg via ORAL
  Filled 2015-12-25 (×3): qty 1

## 2015-12-25 MED ORDER — POTASSIUM CHLORIDE 20 MEQ PO PACK
20.0000 meq | PACK | Freq: Two times a day (BID) | ORAL | Status: DC
  Filled 2015-12-25: qty 1

## 2015-12-25 MED ORDER — HYDROMORPHONE HCL 2 MG/ML IJ SOLN
0.5000 mg | INTRAMUSCULAR | Status: DC | PRN
  Administered 2015-12-25 – 2015-12-28 (×4): 0.5 mg via INTRAVENOUS
  Filled 2015-12-25 (×4): qty 1

## 2015-12-25 MED ORDER — POTASSIUM CHLORIDE 20 MEQ PO PACK
40.0000 meq | PACK | Freq: Two times a day (BID) | ORAL | Status: DC
  Filled 2015-12-25: qty 2

## 2015-12-25 MED ORDER — POTASSIUM CHLORIDE CRYS ER 20 MEQ PO TBCR
20.0000 meq | EXTENDED_RELEASE_TABLET | Freq: Two times a day (BID) | ORAL | Status: DC
  Administered 2015-12-25 – 2015-12-26 (×2): 20 meq via ORAL
  Filled 2015-12-25 (×2): qty 1

## 2015-12-25 NOTE — Progress Notes (Signed)
Patient ID: [name], female   DOB: 1951-01-06, 65 y.o.   MRN: [id number]  [facility] Surgery Progress Note  6 Days Post-Op  Subjective: Just woke up but so far feeling well this morning. Denies any n/v yesterday evening or today. Antibiotics and octreotide stopped yesterday. Anxious about PICC placement.  Objective: Vital signs in last 24 hours: Temp:  [98.5 F (36.9 C)-99.8 F (37.7 C)] 98.5 F (36.9 C) (10/17 0505) Pulse Rate:  [91-99] 99 (10/17 0505) Resp:  [16-17] 16 (10/17 0505) BP: (123-158)/(72-93) 158/93 (10/17 0505) SpO2:  [94 %-95 %] 95 % (10/17 0505) Last BM Date: 12/23/15  Intake/Output from previous day: 10/16 0701 - 10/17 0700 In: 70 [IV Piggyback:50] Out: 535 [Drains:535] Intake/Output this shift: No intake/output data recorded.  PE: Gen:  Alert, NAD, pleasant Pulm:  effort normal Abd: Soft, mildly distended, mild tenderness around drains, +BS, drain sites cdi with bilous output  RUQ drain 425cc/24hr R abd drain 110cc/24hr  Lab Results:   Recent Labs  12/25/15 0534  WBC 12.8*  HGB 11.5*  HCT 36.1  PLT 364   BMET  Recent Labs  12/25/15 0534  NA 137  K 3.0*  CL 100*  CO2 27  GLUCOSE 121*  BUN 7  CREATININE 0.78  CALCIUM 8.0*   PT/INR No results for input(s): LABPROT, INR in the last 72 hours. CMP     Component Value Date/Time   NA 137 12/25/2015 0534   K 3.0 (L) 12/25/2015 0534   CL 100 (L) 12/25/2015 0534   CO2 27 12/25/2015 0534   GLUCOSE 121 (H) 12/25/2015 0534   BUN 7 12/25/2015 0534   CREATININE 0.78 12/25/2015 0534   CREATININE 1.17 (H) 07/13/2015 1056   CALCIUM 8.0 (L) 12/25/2015 0534   PROT 5.6 (L) 12/25/2015 0534   ALBUMIN 2.4 (L) 12/25/2015 0534   AST 24 12/25/2015 0534   ALT 18 12/25/2015 0534   ALKPHOS 137 (H) 12/25/2015 0534   BILITOT 0.4 12/25/2015 0534   GFRNONAA >60 12/25/2015 0534   GFRNONAA 50 (L) 07/13/2015 1056   GFRAA >60 12/25/2015 0534   GFRAA 57 (L) 07/13/2015 1056   Lipase      Component Value Date/Time   LIPASE 47 12/16/2015 0241       Studies/Results: No results found.  Anti-infectives: Anti-infectives    Start     Dose/Rate Route Frequency Ordered Stop   12/14/15 1445  cefTRIAXone (ROCEPHIN) 2 g in dextrose 5 % 50 mL IVPB     2 g 100 mL/hr over 30 Minutes Intravenous Every 24 hours 12/14/15 1435     12/14/15 1445  metroNIDAZOLE (FLAGYL) tablet 500 mg     500 mg Oral Every 8 hours 12/14/15 1435 12/24/15 2359   12/12/15 0930  vancomycin (VANCOCIN) IVPB 1000 mg/200 mL premix  Status:  Discontinued     1,000 mg 200 mL/hr over 60 Minutes Intravenous Every 12 hours 12/12/15 0913 12/14/15 1435   12/11/15 0800  piperacillin-tazobactam (ZOSYN) IVPB 3.375 g  Status:  Discontinued     3.375 g 12.5 mL/hr over 240 Minutes Intravenous Every 8 hours 12/11/15 0536 12/14/15 1435   12/11/15 0045  piperacillin-tazobactam (ZOSYN) IVPB 3.375 g     3.375 g 100 mL/hr over 30 Minutes Intravenous  Once 12/11/15 0038 12/11/15 0339       Assessment/Plan S/p Procedure(s): ENDOSCOPIC RETROGRADE CHOLANGIOPANCREATOGRAPHY (ERCP),  BILIARY STENT PLACEMENT (10 F by 7cm) on 12/12/2015 by Dr. Loletha Carrow - For post-op bile leak s/p lac chole w/  [place] 12/06/15, Dr. Barry Dienes - RUQ drain placed 12/12/15. Total drain output 110cc/24hr - culture: viridans streptococcus, rocephin and flagyl stopped yesterday - second, perihepatic drain placed 12/17/15. Total drain output 425cc/24hr (slowly decreasing) - octreotide stopped yesterday - TMAX 99.8, WBC 12.8  Hypokalemia -   PICC line in place  TEE - negative for endocarditis  FEN: as tolerated, Boost BID ID: Zosyn 10/3>>10/5, vanc 10/4>>10/6,              Rocephin 10/6 >>10/17, Flagyl 10/6 >>10/16 VTE: SCD's, Lovenox   Plan: drain output slightly less today, 425cc/24hr, will continue to monitor. Spoke with endoscopy who stated the stents do go up to 9 and 12cm. Completed antibiotics yesterday. No n/v as of last night or this morning.  Continue scheduled zofran. Xanax PRN PICC placement and at night.   LOS: 14 days    Jerrye Beavers , [facility] Surgery 12/25/2015, 8:11 AM Pager: [phone] Consults: [phone] Mon-Fri 7:00 am-4:30 pm Sat-Sun 7:00 am-11:30 am  Agree with above. Husband with patient.  They have been walking. Combined drains have put out 535 so far.  I stopped Rocephin. I wrote for oral K+ - though she has had some trouble tolerating this.  The question is how do we study the drains.  Alphonsa Overall, MD, [facility] Surgery Pager: [phone] Office phone:  [phone]

## 2015-12-26 ENCOUNTER — Inpatient Hospital Stay (HOSPITAL_COMMUNITY): Payer: BLUE CROSS/BLUE SHIELD

## 2015-12-26 LAB — BASIC METABOLIC PANEL
Anion gap: 9 (ref 5–15)
BUN: 8 mg/dL (ref 6–20)
CO2: 29 mmol/L (ref 22–32)
Calcium: 7.9 mg/dL — ABNORMAL LOW (ref 8.9–10.3)
Chloride: 99 mmol/L — ABNORMAL LOW (ref 101–111)
Creatinine, Ser: 0.81 mg/dL (ref 0.44–1.00)
GFR calc Af Amer: >60 mL/min (ref >60)
GFR calc non Af Amer: >60 mL/min (ref >60)
Glucose, Bld: 117 mg/dL — ABNORMAL HIGH (ref 65–99)
Potassium: 2.9 mmol/L — ABNORMAL LOW (ref 3.5–5.1)
Sodium: 137 mmol/L (ref 135–145)

## 2015-12-26 MED ORDER — IOPAMIDOL (ISOVUE-300) INJECTION 61%
15.0000 mL | INTRAVENOUS | Status: AC
  Administered 2015-12-26 (×2): 15 mL via ORAL

## 2015-12-26 MED ORDER — SERTRALINE HCL 50 MG PO TABS
50.0000 mg | ORAL_TABLET | Freq: Every day | ORAL | Status: DC
  Administered 2015-12-26 – 2015-12-28 (×3): 50 mg via ORAL
  Filled 2015-12-26 (×3): qty 1

## 2015-12-26 MED ORDER — TOPIRAMATE 100 MG PO TABS
100.0000 mg | ORAL_TABLET | Freq: Two times a day (BID) | ORAL | Status: DC
  Administered 2015-12-26 – 2015-12-28 (×4): 100 mg via ORAL
  Filled 2015-12-26 (×4): qty 1

## 2015-12-26 MED ORDER — IOPAMIDOL (ISOVUE-300) INJECTION 61%
INTRAVENOUS | Status: AC
  Filled 2015-12-26: qty 100

## 2015-12-26 MED ORDER — GABAPENTIN 600 MG PO TABS
600.0000 mg | ORAL_TABLET | Freq: Two times a day (BID) | ORAL | Status: DC
  Administered 2015-12-26 – 2015-12-28 (×4): 600 mg via ORAL
  Filled 2015-12-26 (×4): qty 1

## 2015-12-26 MED ORDER — RAMIPRIL 2.5 MG PO CAPS
2.5000 mg | ORAL_CAPSULE | Freq: Every day | ORAL | Status: DC
  Administered 2015-12-26 – 2015-12-28 (×3): 2.5 mg via ORAL
  Filled 2015-12-26 (×3): qty 1

## 2015-12-26 MED ORDER — SODIUM CHLORIDE 0.9 % IV SOLN
Freq: Once | INTRAVENOUS | Status: AC
  Administered 2015-12-26: 11:00:00 via INTRAVENOUS
  Filled 2015-12-26: qty 1000

## 2015-12-26 MED ORDER — POTASSIUM CHLORIDE CRYS ER 20 MEQ PO TBCR
20.0000 meq | EXTENDED_RELEASE_TABLET | Freq: Three times a day (TID) | ORAL | Status: DC
  Administered 2015-12-26 – 2015-12-28 (×7): 20 meq via ORAL
  Filled 2015-12-26 (×7): qty 1

## 2015-12-26 NOTE — Progress Notes (Signed)
Referring Physician(s): Connor,C  Supervising Physician: Jacqulynn Cadet  Patient Status:  [facility] - In-pt  Chief Complaint: biloma   Subjective: Pt sitting up in chair; no new c/o; feeling a little better   Allergies: Shellfish allergy  Medications: Prior to Admission medications   Medication Sig Start Date End Date Taking? Authorizing Provider  atorvastatin (LIPITOR) 40 MG tablet Take 1 tablet (40 mg total) by mouth daily. 01/04/15  Yes Elberta Leatherwood, MD  clopidogrel (PLAVIX) 75 MG tablet TAKE 1 TABLET BY MOUTH EVERY DAY 09/04/15  Yes Elberta Leatherwood, MD  doxepin (SINEQUAN) 100 MG capsule Take 2 capsules (200 mg total) by mouth at bedtime. 10/15/14  Yes Kathrynn Ducking, MD  furosemide (LASIX) 20 MG tablet Take 10 mg by mouth daily. 05/09/14  Yes Historical Provider, MD  gabapentin (NEURONTIN) 600 MG tablet Take 1 tablet (600 mg total) by mouth 2 (two) times daily. 12/05/15  Yes Charlett Blake, MD  lidocaine (LIDODERM) 5 % Place 2 patches onto the skin daily. Remove & Discard patch within 12 hours or as directed by MD 11/28/15  Yes Charlett Blake, MD  meclizine (ANTIVERT) 25 MG tablet Take 1 tablet (25 mg total) by mouth 2 (two) times daily as needed for dizziness. 07/16/15  Yes Elberta Leatherwood, MD  ondansetron (ZOFRAN-ODT) 4 MG disintegrating tablet Take 1 tablet (4 mg total) by mouth every 6 (six) hours as needed for nausea. 12/08/15  Yes Jerrye Beavers, PA-C  oxyCODONE (OXY IR/ROXICODONE) 5 MG immediate release tablet Take 1-2 tablets (5-10 mg total) by mouth every 4 (four) hours as needed for moderate pain. 12/08/15  Yes Brooke A Miller, PA-C  ramipril (ALTACE) 2.5 MG capsule TAKE ONE CAPSULE BY MOUTH EVERY DAY 10/01/15  Yes Elberta Leatherwood, MD  tiZANidine (ZANAFLEX) 2 MG tablet Take 1 tablet (2 mg total) by mouth 3 (three) times daily. 09/12/15  Yes Ward Givens, NP  topiramate (TOPAMAX) 100 MG tablet TAKE 1 TABLET TWICE A DAY 05/07/15  Yes Kathrynn Ducking, MD  sertraline (ZOLOFT) 50  MG tablet TAKE 1 TABLET (50 MG TOTAL) BY MOUTH 2 (TWO) TIMES DAILY. 12/13/15   Elberta Leatherwood, MD     Vital Signs: BP 128/65 (BP Location: Left Arm)   Pulse (!) 104   Temp 98.3 F (36.8 C) (Oral)   Resp 16   Ht 5\' 7"  (1.702 m)   Wt 166 lb (75.3 kg)   SpO2 94%   BMI 26.00 kg/m   Physical Exam awake/alert; RUQ drains intact, dressings dry, sites mildly tender, outputs 175/10 cc  Imaging: No results found.  Labs:  CBC:  Recent Labs  12/19/15 0339 12/20/15 0518 12/21/15 0502 12/25/15 0534  WBC 12.1* 12.4* 11.8* 12.8*  HGB 10.8* 11.3* 10.6* 11.5*  HCT 34.2* 35.1* 32.8* 36.1  PLT 640* 649* 498* 364    COAGS:  Recent Labs  12/05/15 0406 12/11/15 0729 12/17/15 0447  INR 1.03 1.10 1.42  APTT 33  --   --     BMP:  Recent Labs  12/20/15 0518 12/21/15 0502 12/25/15 0534 12/26/15 0511  NA 139 141 137 137  K 4.0 3.6 3.0* 2.9*  CL 103 105 100* 99*  CO2 25 26 27 29   GLUCOSE 144* 128* 121* 117*  BUN <5* <5* 7 8  CALCIUM 8.3* 8.0* 8.0* 7.9*  CREATININE 0.82 0.82 0.78 0.81  GFRNONAA >60 >60 >60 >60  GFRAA >60 >60 >60 >60    LIVER FUNCTION  TESTS:  Recent Labs  12/19/15 0339 12/20/15 0518 12/21/15 0502 12/25/15 0534  BILITOT 0.3 0.3 0.2* 0.4  AST 50* 41 30 24  ALT 32 31 22 18   ALKPHOS 207* 251* 180* 137*  PROT 5.6* 6.0* 5.4* 5.6*  ALBUMIN 2.1* 2.4* 2.2* 2.4*    Assessment and Plan: S/p biloma drains(post cholecystectomy/stenting by GI) 10/3 and 10/9; AF; K 2.9- replace; creat ok; drain outputs decreasing; f/u CT ordered by [facility] today; cont current tx for now   Electronically Signed: D. Rowe Robert 12/26/2015, 9:19 AM   I spent a total of 15 minutes at the the patient's bedside AND on the patient's hospital floor or unit, greater than 50% of which was counseling/coordinating care for biloma drains    Patient ID: [name], female   DOB: 04/16/1950, 65 y.o.   MRN: [id number]

## 2015-12-26 NOTE — Progress Notes (Signed)
Patient ID: [name], female   DOB: 07/05/1950, 65 y.o.   MRN: [id number]  [facility] Surgery Progress Note  7 Days Post-Op  Subjective: Feeling better than yesterday. Had some nausea with pain but no vomiting. Tolerating diet. Had BM this morning. Having difficulty with PO potassium.  Objective: Vital signs in last 24 hours: Temp:  [98.3 F (36.8 C)-98.7 F (37.1 C)] 98.3 F (36.8 C) (10/18 0610) Pulse Rate:  [97-104] 104 (10/18 0610) Resp:  [15-16] 16 (10/18 0610) BP: (128-142)/(65-75) 128/65 (10/18 0610) SpO2:  [93 %-100 %] 94 % (10/18 0610) Last BM Date: 12/25/15  Intake/Output from previous day: 10/17 0701 - 10/18 0700 In: 120 [P.O.:60; IV Piggyback:50] Out: 185 [Drains:185] Intake/Output this shift: Total I/O In: 10 [Other:10] Out: -   PE: Gen: Alert, NAD, pleasant Pulm: effort normal Abd: Soft, mildly distended, mild tenderness around drains, +BS, drain sites cdi with bilous output  RUQ drain 175cc/24hr R abd drain 10cc/24hr  Lab Results:   Recent Labs  12/25/15 0534  WBC 12.8*  HGB 11.5*  HCT 36.1  PLT 364   BMET  Recent Labs  12/25/15 0534 12/26/15 0511  NA 137 137  K 3.0* 2.9*  CL 100* 99*  CO2 27 29  GLUCOSE 121* 117*  BUN 7 8  CREATININE 0.78 0.81  CALCIUM 8.0* 7.9*   PT/INR No results for input(s): LABPROT, INR in the last 72 hours. CMP     Component Value Date/Time   NA 137 12/26/2015 0511   K 2.9 (L) 12/26/2015 0511   CL 99 (L) 12/26/2015 0511   CO2 29 12/26/2015 0511   GLUCOSE 117 (H) 12/26/2015 0511   BUN 8 12/26/2015 0511   CREATININE 0.81 12/26/2015 0511   CREATININE 1.17 (H) 07/13/2015 1056   CALCIUM 7.9 (L) 12/26/2015 0511   PROT 5.6 (L) 12/25/2015 0534   ALBUMIN 2.4 (L) 12/25/2015 0534   AST 24 12/25/2015 0534   ALT 18 12/25/2015 0534   ALKPHOS 137 (H) 12/25/2015 0534   BILITOT 0.4 12/25/2015 0534   GFRNONAA >60 12/26/2015 0511   GFRNONAA 50 (L) 07/13/2015 1056   GFRAA >60 12/26/2015 0511   GFRAA 57 (L) 07/13/2015 1056   Lipase     Component Value Date/Time   LIPASE 47 12/16/2015 0241       Studies/Results: No results found.  Anti-infectives: Anti-infectives    Start     Dose/Rate Route Frequency Ordered Stop   12/14/15 1445  cefTRIAXone (ROCEPHIN) 2 g in dextrose 5 % 50 mL IVPB  Status:  Discontinued     2 g 100 mL/hr over 30 Minutes Intravenous Every 24 hours 12/14/15 1435 12/25/15 1834   12/14/15 1445  metroNIDAZOLE (FLAGYL) tablet 500 mg     500 mg Oral Every 8 hours 12/14/15 1435 12/24/15 2359   12/12/15 0930  vancomycin (VANCOCIN) IVPB 1000 mg/200 mL premix  Status:  Discontinued     1,000 mg 200 mL/hr over 60 Minutes Intravenous Every 12 hours 12/12/15 0913 12/14/15 1435   12/11/15 0800  piperacillin-tazobactam (ZOSYN) IVPB 3.375 g  Status:  Discontinued     3.375 g 12.5 mL/hr over 240 Minutes Intravenous Every 8 hours 12/11/15 0536 12/14/15 1435   12/11/15 0045  piperacillin-tazobactam (ZOSYN) IVPB 3.375 g     3.375 g 100 mL/hr over 30 Minutes Intravenous  Once 12/11/15 0038 12/11/15 0339       Assessment/Plan S/p Procedure(s): ENDOSCOPIC RETROGRADE CHOLANGIOPANCREATOGRAPHY (ERCP),  BILIARY STENT PLACEMENT (10 F by 7cm) on  12/12/2015 by Dr. Loletha Carrow - For post-op bile leak s/p lac chole w/ IOC 12/06/15, Dr. Barry Dienes - RUQ drain placed 12/12/15. Total drain output 10cc/24hr - culture: viridans streptococcus, antibiotic therapy completed - second, perihepatic drain placed 12/17/15. Total drain output 175cc/24hr(slowly decreasing) - octreotide stopped - afebrile  Hypokalemia - 2.9 despite K in IVF and 42mEq PO BID yesterday  PICC line in place  TEE - negative for endocarditis  FEN: as tolerated, Boost BID ID: Zosyn 10/3>>10/5, vanc 10/4>>10/6,  Rocephin 10/6 >>10/17, Flagyl 10/6 >>10/16 VTE: SCD's, Lovenox   Plan: drain output continues to decrease, 175cc/24hr. Repeat CT scan today. Continue scheduled zofran.Will try to increase PO K  to TID, and do bolus of IVF with 40 mEq K.   LOS: 15 days    Jerrye Beavers , [facility] 12/26/2015, 9:33 AM Pager: [phone] Consults: [phone] Mon-Fri 7:00 am-4:30 pm Sat-Sun 7:00 am-11:30 am  Agree with above. CT scan today shows improvement in intra-abdominal fluid collections.  So this is better.  But unfortunately, she has an increasing right pleural effusion.  Will plan to tap this tomorrow in IR. 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  So she is more familiar with thoracentesis and chest tubes than the average patient.  I also went through and renewed her home meds that we agreed to renew.  Her PCP is Dr. Georges Lynch in the family practice department.  She expressed surprise that he had not seen her.  Alphonsa Overall, MD, [facility] Surgery Pager: [phone] Office phone:  [phone]

## 2015-12-27 ENCOUNTER — Inpatient Hospital Stay (HOSPITAL_COMMUNITY): Payer: BLUE CROSS/BLUE SHIELD

## 2015-12-27 LAB — CBC
HCT: 35.7 % — ABNORMAL LOW (ref 36.0–46.0)
Hemoglobin: 11.4 g/dL — ABNORMAL LOW (ref 12.0–15.0)
MCH: 28.4 pg (ref 26.0–34.0)
MCHC: 31.9 g/dL (ref 30.0–36.0)
MCV: 89 fL (ref 78.0–100.0)
Platelets: 330 10*3/uL (ref 150–400)
RBC: 4.01 MIL/uL (ref 3.87–5.11)
RDW: 15.5 % (ref 11.5–15.5)
WBC: 7.5 10*3/uL (ref 4.0–10.5)

## 2015-12-27 LAB — GRAM STAIN

## 2015-12-27 LAB — POTASSIUM: Potassium: 4.1 mmol/L (ref 3.5–5.1)

## 2015-12-27 MED ORDER — ENOXAPARIN SODIUM 30 MG/0.3ML ~~LOC~~ SOLN
30.0000 mg | SUBCUTANEOUS | Status: DC
  Administered 2015-12-27: 30 mg via SUBCUTANEOUS
  Filled 2015-12-27: qty 0.3

## 2015-12-27 MED ORDER — LIDOCAINE HCL 1 % IJ SOLN
INTRAMUSCULAR | Status: AC
  Filled 2015-12-27: qty 20

## 2015-12-27 NOTE — Progress Notes (Signed)
Patient ID: [name], female   DOB: Jan 06, 1951, 65 y.o.   MRN: [id number]    Referring Physician(s): Dr. Coralie Keens  Supervising Physician: Sandi Mariscal  Patient Status: [facility] - In-pt  Chief Complaint: biloma  Subjective: Patient doing well.  Drain output is improving. Having some SOB.  Allergies: Shellfish allergy  Medications: Prior to Admission medications   Medication Sig Start Date End Date Taking? Authorizing Provider  atorvastatin (LIPITOR) 40 MG tablet Take 1 tablet (40 mg total) by mouth daily. 01/04/15  Yes Elberta Leatherwood, MD  clopidogrel (PLAVIX) 75 MG tablet TAKE 1 TABLET BY MOUTH EVERY DAY 09/04/15  Yes Elberta Leatherwood, MD  doxepin (SINEQUAN) 100 MG capsule Take 2 capsules (200 mg total) by mouth at bedtime. 10/15/14  Yes Kathrynn Ducking, MD  furosemide (LASIX) 20 MG tablet Take 10 mg by mouth daily. 05/09/14  Yes Historical Provider, MD  gabapentin (NEURONTIN) 600 MG tablet Take 1 tablet (600 mg total) by mouth 2 (two) times daily. 12/05/15  Yes Charlett Blake, MD  lidocaine (LIDODERM) 5 % Place 2 patches onto the skin daily. Remove & Discard patch within 12 hours or as directed by MD 11/28/15  Yes Charlett Blake, MD  meclizine (ANTIVERT) 25 MG tablet Take 1 tablet (25 mg total) by mouth 2 (two) times daily as needed for dizziness. 07/16/15  Yes Elberta Leatherwood, MD  ondansetron (ZOFRAN-ODT) 4 MG disintegrating tablet Take 1 tablet (4 mg total) by mouth every 6 (six) hours as needed for nausea. 12/08/15  Yes Jerrye Beavers, PA-C  oxyCODONE (OXY IR/ROXICODONE) 5 MG immediate release tablet Take 1-2 tablets (5-10 mg total) by mouth every 4 (four) hours as needed for moderate pain. 12/08/15  Yes Brooke A Miller, PA-C  ramipril (ALTACE) 2.5 MG capsule TAKE ONE CAPSULE BY MOUTH EVERY DAY 10/01/15  Yes Elberta Leatherwood, MD  tiZANidine (ZANAFLEX) 2 MG tablet Take 1 tablet (2 mg total) by mouth 3 (three) times daily. 09/12/15  Yes Ward Givens, NP  topiramate (TOPAMAX) 100 MG tablet  TAKE 1 TABLET TWICE A DAY 05/07/15  Yes Kathrynn Ducking, MD  sertraline (ZOLOFT) 50 MG tablet TAKE 1 TABLET (50 MG TOTAL) BY MOUTH 2 (TWO) TIMES DAILY. 12/13/15   Elberta Leatherwood, MD    Vital Signs: BP 132/75 (BP Location: Left Arm)   Pulse (!) 106   Temp 98.3 F (36.8 C) (Oral)   Resp 15   Ht 5\' 7"  (1.702 m)   Wt 166 lb (75.3 kg)   SpO2 100%   BMI 26.00 kg/m   Physical Exam: Abd: 2 drains in place in her abdomen both with bilious output.  One drain with 10cc out yesterday. The other with 175cc, now with 50cc.  Both drain sites are c/d/i  Imaging: Dg Chest 1 View  Result Date: 12/27/2015 CLINICAL DATA:  Thoracentesis. EXAM: CHEST 1 VIEW COMPARISON:  12/10/2015. FINDINGS: No evidence of pneumothorax post thoracentesis. Interim reduction in right pleural effusion. Bibasilar subsegmental atelectasis. Heart size normal. IMPRESSION: No evidence of pneumothorax post thoracentesis. Electronically Signed   By: [place]   On: 12/27/2015 14:18   Ct Abdomen Pelvis W Contrast  Result Date: 12/26/2015 CLINICAL DATA:  Status post biliary stent  drainage EXAM: CT ABDOMEN AND PELVIS WITH CONTRAST TECHNIQUE: Multidetector CT imaging of the abdomen and pelvis was performed using the standard protocol following bolus administration of intravenous contrast. CONTRAST:  100 cc of Isovue 300 COMPARISON:  12/15/2015 FINDINGS: Lower  chest: There is a large right pleural effusion which appears increased in volume from previous exam. Compressive type atelectasis of the entire right lower lobe noted. There is also atelectasis involving the right middle lobe, image number 16 of series 3. Hepatobiliary: Percutaneous drainage catheter is situated within the subcapsular fluid collection overlying the anterior lateral right lobe of liver. The fluid collection has decreased in volume from previous exam. Currently 5.2 x 1.4 cm, image 27 of series 2. Previously 11.2 x 5.4 cm. A second drainage catheter is again  identified within the gallbladder fossa. There has been resolution of the fluid collection within this area. Biliary stent is identified. Pneumobilia is identified compatible with ongoing biliary patency. Pancreas: Unremarkable. No pancreatic ductal dilatation or surrounding inflammatory changes. Spleen: Normal in size without focal abnormality. Adrenals/Urinary Tract: Adrenal glands are unremarkable. Kidneys are normal, without renal calculi, focal lesion, or hydronephrosis. Bladder is unremarkable. Stomach/Bowel: The stomach is within normal limits. The small bowel loops have a normal course and caliber. No obstruction. Normal appearance of the colon. Vascular/Lymphatic: Aortic atherosclerosis. No enlarged retroperitoneal or mesenteric adenopathy. No enlarged pelvic or inguinal lymph nodes. Reproductive: Status post hysterectomy. No adnexal masses. Other: No abdominal wall hernia or abnormality. No abdominopelvic ascites. 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 IMPRESSION: 1. Percutaneous drainage catheter overlying the right lobe of liver is in place with significant decrease in volume of subcapsular fluid collection. There has also been resolution of fluid collection within the gallbladder fossa. 2. Biliary stent is in place. Pneumobilia is present indicating biliary patency. 3. Increase in volume of right pleural effusion. 4. Aortic atherosclerosis Electronically Signed   By: Kerby Moors M.D.   On: 12/26/2015 11:55   US Thoracentesis Asp Pleural Space W/img Guide  Result Date: 12/27/2015 INDICATION: History of a biloma with large right pleural effusion. Request is made for diagnostic and therapeutic thoracentesis. EXAM: ULTRASOUND GUIDED DIAGNOSTIC AND THERAPEUTIC THORACENTESIS MEDICATIONS: 1% lidocaine. COMPLICATIONS: None immediate. PROCEDURE: An ultrasound guided thoracentesis was thoroughly discussed with the patient and questions answered. The benefits, risks,  alternatives and complications were also discussed. The patient understands and wishes to proceed with the procedure. Written consent was obtained. Ultrasound was performed to localize and mark an adequate pocket of fluid in the right chest. The area was then prepped and draped in the normal sterile fashion. 1% Lidocaine was used for local anesthesia. Under ultrasound guidance a Safe-T-Centesis catheter was introduced. Thoracentesis was performed. The catheter was removed and a dressing applied. FINDINGS: A total of approximately 1.8 L of yellow fluid was removed. Samples were sent to the laboratory as requested by the clinical team. IMPRESSION: Successful ultrasound guided right thoracentesis yielding 1.8 L of pleural fluid. Read by: Saverio Danker, PA-C Electronically Signed   By: Sandi Mariscal M.D.   On: 12/27/2015 14:07    Labs:  CBC:  Recent Labs  12/19/15 0339 12/20/15 0518 12/21/15 0502 12/25/15 0534  WBC 12.1* 12.4* 11.8* 12.8*  HGB 10.8* 11.3* 10.6* 11.5*  HCT 34.2* 35.1* 32.8* 36.1  PLT 640* 649* 498* 364    COAGS:  Recent Labs  12/05/15 0406 12/11/15 0729 12/17/15 0447  INR 1.03 1.10 1.42  APTT 33  --   --     BMP:  Recent Labs  12/20/15 0518 12/21/15 0502 12/25/15 0534 12/26/15 0511  NA 139 141 137 137  K 4.0 3.6 3.0* 2.9*  CL 103 105 100* 99*  CO2 25 26 27 29   GLUCOSE 144* 128* 121* 117*  BUN <5* <5* 7 8  CALCIUM 8.3* 8.0* 8.0* 7.9*  CREATININE 0.82 0.82 0.78 0.81  GFRNONAA >60 >60 >60 >60  GFRAA >60 >60 >60 >60    LIVER FUNCTION TESTS:  Recent Labs  12/19/15 0339 12/20/15 0518 12/21/15 0502 12/25/15 0534  BILITOT 0.3 0.3 0.2* 0.4  AST 50* 41 30 24  ALT 32 31 22 18   ALKPHOS 207* 251* 180* 137*  PROT 5.6* 6.0* 5.4* 5.6*  ALBUMIN 2.1* 2.4* 2.2* 2.4*    Assessment and Plan: 1. Biloma, s/p drain placement x2 -drain output from both drains has dramatically decreased and scan from yesterday shows much improvement.  Given these drains are for a  persistent draining bile leak, these will remain in place until surgery says they can be removed.  In the interim we will have her follow up with [place] in 2 weeks for a repeat scan of just the abdomen and drain evaluation with possible drain injections.   2. S/p thoracentesis -1800cc removed from right pleural space  Electronically Signed: Telford Archambeau E 12/27/2015, 3:19 PM   I spent a total of 15 Minutes at the the patient's bedside AND on the patient's hospital floor or unit, greater than 50% of which was counseling/coordinating care for biloma

## 2015-12-27 NOTE — Progress Notes (Addendum)
Patient ID: [name], female   DOB: 07-11-1950, 65 y.o.   MRN: [id number]  [facility] Surgery Progress Note  8 Days Post-Op  Subjective: Anxious about thoracentesis but overall feeling better. No recent n/v. Sore but pain well controlled.  Objective: Vital signs in last 24 hours: Temp:  [98.5 F (36.9 C)-98.8 F (37.1 C)] 98.5 F (36.9 C) (10/19 0511) Pulse Rate:  [90-99] 90 (10/19 0511) Resp:  [15-16] 15 (10/19 0511) BP: (124-131)/(59-64) 128/59 (10/19 0511) SpO2:  [96 %-99 %] 96 % (10/19 0511) Last BM Date: 12/26/15  Intake/Output from previous day: 10/18 0701 - 10/19 0700 In: 898.8 [P.O.:480; I.V.:398.8] Out: 50 [Drains:50] Intake/Output this shift: No intake/output data recorded.  PE: Gen: Alert, NAD, pleasant Pulm: effort normal, decreased lung sounds at base of right lung Abd: Soft, mildly distended, mild tenderness around drains, +BS, drain sites cdi with minimal bilous output in bags  RUQ drain 50cc/24hr R abd drain 0cc/24hr  Lab Results:   Recent Labs  12/25/15 0534  WBC 12.8*  HGB 11.5*  HCT 36.1  PLT 364   BMET  Recent Labs  12/25/15 0534 12/26/15 0511  NA 137 137  K 3.0* 2.9*  CL 100* 99*  CO2 27 29  GLUCOSE 121* 117*  BUN 7 8  CREATININE 0.78 0.81  CALCIUM 8.0* 7.9*   PT/INR No results for input(s): LABPROT, INR in the last 72 hours. CMP     Component Value Date/Time   NA 137 12/26/2015 0511   K 2.9 (L) 12/26/2015 0511   CL 99 (L) 12/26/2015 0511   CO2 29 12/26/2015 0511   GLUCOSE 117 (H) 12/26/2015 0511   BUN 8 12/26/2015 0511   CREATININE 0.81 12/26/2015 0511   CREATININE 1.17 (H) 07/13/2015 1056   CALCIUM 7.9 (L) 12/26/2015 0511   PROT 5.6 (L) 12/25/2015 0534   ALBUMIN 2.4 (L) 12/25/2015 0534   AST 24 12/25/2015 0534   ALT 18 12/25/2015 0534   ALKPHOS 137 (H) 12/25/2015 0534   BILITOT 0.4 12/25/2015 0534   GFRNONAA >60 12/26/2015 0511   GFRNONAA 50 (L) 07/13/2015 1056   GFRAA >60 12/26/2015 0511   GFRAA  57 (L) 07/13/2015 1056   Lipase     Component Value Date/Time   LIPASE 47 12/16/2015 0241       Studies/Results: Ct Abdomen Pelvis W Contrast  Result Date: 12/26/2015 CLINICAL DATA:  Status post biliary stent  drainage EXAM: CT ABDOMEN AND PELVIS WITH CONTRAST TECHNIQUE: Multidetector CT imaging of the abdomen and pelvis was performed using the standard protocol following bolus administration of intravenous contrast. CONTRAST:  100 cc of Isovue 300 COMPARISON:  12/15/2015 FINDINGS: Lower chest: There is a large right pleural effusion which appears increased in volume from previous exam. Compressive type atelectasis of the entire right lower lobe noted. There is also atelectasis involving the right middle lobe, image number 16 of series 3. Hepatobiliary: Percutaneous drainage catheter is situated within the subcapsular fluid collection overlying the anterior lateral right lobe of liver. The fluid collection has decreased in volume from previous exam. Currently 5.2 x 1.4 cm, image 27 of series 2. Previously 11.2 x 5.4 cm. A second drainage catheter is again identified within the gallbladder fossa. There has been resolution of the fluid collection within this area. Biliary stent is identified. Pneumobilia is identified compatible with ongoing biliary patency. Pancreas: Unremarkable. No pancreatic ductal dilatation or surrounding inflammatory changes. Spleen: Normal in size without focal abnormality. Adrenals/Urinary Tract: Adrenal glands are unremarkable. Kidneys  are normal, without renal calculi, focal lesion, or hydronephrosis. Bladder is unremarkable. Stomach/Bowel: The stomach is within normal limits. The small bowel loops have a normal course and caliber. No obstruction. Normal appearance of the colon. Vascular/Lymphatic: Aortic atherosclerosis. No enlarged retroperitoneal or mesenteric adenopathy. No enlarged pelvic or inguinal lymph nodes. Reproductive: Status post hysterectomy. No adnexal masses.  Other: No abdominal wall hernia or abnormality. No abdominopelvic ascites. 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 IMPRESSION: 1. Percutaneous drainage catheter overlying the right lobe of liver is in place with significant decrease in volume of subcapsular fluid collection. There has also been resolution of fluid collection within the gallbladder fossa. 2. Biliary stent is in place. Pneumobilia is present indicating biliary patency. 3. Increase in volume of right pleural effusion. 4. Aortic atherosclerosis Electronically Signed   By: Kerby Moors M.D.   On: 12/26/2015 11:55    Anti-infectives: Anti-infectives    Start     Dose/Rate Route Frequency Ordered Stop   12/14/15 1445  cefTRIAXone (ROCEPHIN) 2 g in dextrose 5 % 50 mL IVPB  Status:  Discontinued     2 g 100 mL/hr over 30 Minutes Intravenous Every 24 hours 12/14/15 1435 12/25/15 1834   12/14/15 1445  metroNIDAZOLE (FLAGYL) tablet 500 mg     500 mg Oral Every 8 hours 12/14/15 1435 12/24/15 2359   12/12/15 0930  vancomycin (VANCOCIN) IVPB 1000 mg/200 mL premix  Status:  Discontinued     1,000 mg 200 mL/hr over 60 Minutes Intravenous Every 12 hours 12/12/15 0913 12/14/15 1435   12/11/15 0800  piperacillin-tazobactam (ZOSYN) IVPB 3.375 g  Status:  Discontinued     3.375 g 12.5 mL/hr over 240 Minutes Intravenous Every 8 hours 12/11/15 0536 12/14/15 1435   12/11/15 0045  piperacillin-tazobactam (ZOSYN) IVPB 3.375 g     3.375 g 100 mL/hr over 30 Minutes Intravenous  Once 12/11/15 0038 12/11/15 0339       Assessment/Plan S/p Procedure(s): ENDOSCOPIC RETROGRADE CHOLANGIOPANCREATOGRAPHY (ERCP),  BILIARY STENT PLACEMENT (10 F by 7cm) on 12/12/2015 by Dr. Loletha Carrow - For post-op bile leak s/p lac chole w/ IOC 12/06/15, Dr. Barry Dienes - RUQ drain placed 12/12/15. Total drain output 0cc/24hr - culture: viridans streptococcus, antibiotic therapy completed - second, perihepatic drain placed 12/17/15. Total drain output  50cc/24hr(slowly decreasing) - octreotide stopped - afebrile. Will recheck CBC today.  Right pleural effusion - scheduled for thoracentesis in IR later today  Hypokalemia - 2.9 yesterday, PO K and IV K given, will recheck today  PICC line has not been placed, patient refusing  TEE - negative for endocarditis  FEN: NPO for procedure ID: Zosyn 10/3>>10/5, vanc 10/4>>10/6,  Rocephin 10/6 >>10/17, Flagyl 10/6 >>10/16 VTE: SCD's, Lovenox (held for procedure)  Plan: scheduled for thoracentesis of right pleural effusion later today; may resume diet as tolerated and Boost after procedure. drain output continues to decrease, 0cc/24hr and 50cc/24hr; will see what IR decides to do with drains. Labs today. Possibly home 1-2 days if doing well.   LOS: 16 days    Jerrye Beavers , [facility] 12/27/2015, 8:50 AM Pager: [phone] Consults: [phone] Mon-Fri 7:00 am-4:30 pm Sat-Sun 7:00 am-11:30 am

## 2015-12-27 NOTE — Progress Notes (Signed)
[facility]:  [phone] General Surgery Progress Note   LOS: 16 days  POD -  8 Days Post-Op  Assessment/Plan: 1A.  ENDOSCOPIC RETROGRADE CHOLANGIOPANCREATOGRAPHY (ERCP),   BILIARY STENT PLACEMENT (10 F by 7cm) on 12/12/2015 by Dr. Loletha Carrow 1B.  Post-op bile leak s/p lac chole w/ IOC 12/06/15, Dr. Barry Dienes   - RUQ drain placed 12/12/15. Total drain output 10cc/24hr  - culture: viridans streptococcus, antibiotic therapy completed  - second, perihepatic drain placed 12/17/15. Total drain output 100 cc/24hr(slowly decreasing)  WBC - 7,500 cc - 12/27/2015  2.  Sympathetic right pleural effusion  1,800 cc tapped today  CXR looks a lot better post thoracentesis 3.  Hypokalemia - 4.1 today (12/27/2015) 4. PICC line in place  TEE - negative for endocarditis  FEN: as tolerated, Boost BID ID: Zosyn 10/3>>10/5, vanc 10/4>>10/6,  Rocephin 10/6 >>10/17, Flagyl 10/6 >>10/16  DVT prophylaxis - Lovenox    Active Problems:   Sepsis (HCC)   Bile leak, postoperative   Biloma   Biloma following surgery   Status post cholecystectomy   Infection by Streptococcus, viridans group   Routine screening for STI (sexually transmitted infection)   Bacteremia   Subjective:  Doing better  Objective:   Vitals:   12/27/15 1400 12/27/15 1436  BP: 139/75 132/75  Pulse:  (!) 106  Resp:    Temp:  98.3 F (36.8 C)     Intake/Output from previous day:  10/18 0701 - 10/19 0700 In: 898.8 [P.O.:480; I.V.:398.8] Out: 50 [Drains:50]  Intake/Output this shift:  Total I/O In: -  Out: 100 [Drains:100]   Physical Exam:   General: WN older WF who is alert and oriented.    HEENT: Normal. Pupils equal. .   Lungs: Better right breath sounds   Abdomen: Soft   Wound: 2 drain - right upper abdomen    Lab Results:    Recent Labs  12/25/15 0534 12/27/15 1530  WBC 12.8* 7.5  HGB 11.5* 11.4*  HCT 36.1 35.7*  PLT 364 330    BMET   Recent Labs  12/25/15 0534  12/26/15 0511 12/27/15 1530  NA 137 137  --   K 3.0* 2.9* 4.1  CL 100* 99*  --   CO2 27 29  --   GLUCOSE 121* 117*  --   BUN 7 8  --   CREATININE 0.78 0.81  --   CALCIUM 8.0* 7.9*  --     PT/INR  No results for input(s): LABPROT, INR in the last 72 hours.  ABG  No results for input(s): PHART, HCO3 in the last 72 hours.  Invalid input(s): PCO2, PO2   Studies/Results:  Dg Chest 1 View  Result Date: 12/27/2015 CLINICAL DATA:  Thoracentesis. EXAM: CHEST 1 VIEW COMPARISON:  12/10/2015. FINDINGS: No evidence of pneumothorax post thoracentesis. Interim reduction in right pleural effusion. Bibasilar subsegmental atelectasis. Heart size normal. IMPRESSION: No evidence of pneumothorax post thoracentesis. Electronically Signed   By: [place]   On: 12/27/2015 14:18   Ct Abdomen Pelvis W Contrast  Result Date: 12/26/2015 CLINICAL DATA:  Status post biliary stent  drainage EXAM: CT ABDOMEN AND PELVIS WITH CONTRAST TECHNIQUE: Multidetector CT imaging of the abdomen and pelvis was performed using the standard protocol following bolus administration of intravenous contrast. CONTRAST:  100 cc of Isovue 300 COMPARISON:  12/15/2015 FINDINGS: Lower chest: There is a large right pleural effusion which appears increased in volume from previous exam. Compressive type atelectasis of the entire right lower lobe  noted. There is also atelectasis involving the right middle lobe, image number 16 of series 3. Hepatobiliary: Percutaneous drainage catheter is situated within the subcapsular fluid collection overlying the anterior lateral right lobe of liver. The fluid collection has decreased in volume from previous exam. Currently 5.2 x 1.4 cm, image 27 of series 2. Previously 11.2 x 5.4 cm. A second drainage catheter is again identified within the gallbladder fossa. There has been resolution of the fluid collection within this area. Biliary stent is identified. Pneumobilia is identified compatible with ongoing  biliary patency. Pancreas: Unremarkable. No pancreatic ductal dilatation or surrounding inflammatory changes. Spleen: Normal in size without focal abnormality. Adrenals/Urinary Tract: Adrenal glands are unremarkable. Kidneys are normal, without renal calculi, focal lesion, or hydronephrosis. Bladder is unremarkable. Stomach/Bowel: The stomach is within normal limits. The small bowel loops have a normal course and caliber. No obstruction. Normal appearance of the colon. Vascular/Lymphatic: Aortic atherosclerosis. No enlarged retroperitoneal or mesenteric adenopathy. No enlarged pelvic or inguinal lymph nodes. Reproductive: Status post hysterectomy. No adnexal masses. Other: No abdominal wall hernia or abnormality. No abdominopelvic ascites. 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 IMPRESSION: 1. Percutaneous drainage catheter overlying the right lobe of liver is in place with significant decrease in volume of subcapsular fluid collection. There has also been resolution of fluid collection within the gallbladder fossa. 2. Biliary stent is in place. Pneumobilia is present indicating biliary patency. 3. Increase in volume of right pleural effusion. 4. Aortic atherosclerosis Electronically Signed   By: Kerby Moors M.D.   On: 12/26/2015 11:55   US Thoracentesis Asp Pleural Space W/img Guide  Result Date: 12/27/2015 INDICATION: History of a biloma with large right pleural effusion. Request is made for diagnostic and therapeutic thoracentesis. EXAM: ULTRASOUND GUIDED DIAGNOSTIC AND THERAPEUTIC THORACENTESIS MEDICATIONS: 1% lidocaine. COMPLICATIONS: None immediate. PROCEDURE: An ultrasound guided thoracentesis was thoroughly discussed with the patient and questions answered. The benefits, risks, alternatives and complications were also discussed. The patient understands and wishes to proceed with the procedure. Written consent was obtained. Ultrasound was performed to localize and mark an  adequate pocket of fluid in the right chest. The area was then prepped and draped in the normal sterile fashion. 1% Lidocaine was used for local anesthesia. Under ultrasound guidance a Safe-T-Centesis catheter was introduced. Thoracentesis was performed. The catheter was removed and a dressing applied. FINDINGS: A total of approximately 1.8 L of yellow fluid was removed. Samples were sent to the laboratory as requested by the clinical team. IMPRESSION: Successful ultrasound guided right thoracentesis yielding 1.8 L of pleural fluid. Read by: Saverio Danker, PA-C Electronically Signed   By: Sandi Mariscal M.D.   On: 12/27/2015 14:07     Anti-infectives:   Anti-infectives    Start     Dose/Rate Route Frequency Ordered Stop   12/14/15 1445  cefTRIAXone (ROCEPHIN) 2 g in dextrose 5 % 50 mL IVPB  Status:  Discontinued     2 g 100 mL/hr over 30 Minutes Intravenous Every 24 hours 12/14/15 1435 12/25/15 1834   12/14/15 1445  metroNIDAZOLE (FLAGYL) tablet 500 mg     500 mg Oral Every 8 hours 12/14/15 1435 12/24/15 2359   12/12/15 0930  vancomycin (VANCOCIN) IVPB 1000 mg/200 mL premix  Status:  Discontinued     1,000 mg 200 mL/hr over 60 Minutes Intravenous Every 12 hours 12/12/15 0913 12/14/15 1435   12/11/15 0800  piperacillin-tazobactam (ZOSYN) IVPB 3.375 g  Status:  Discontinued     3.375 g 12.5 mL/hr  over 240 Minutes Intravenous Every 8 hours 12/11/15 0536 12/14/15 1435   12/11/15 0045  piperacillin-tazobactam (ZOSYN) IVPB 3.375 g     3.375 g 100 mL/hr over 30 Minutes Intravenous  Once 12/11/15 0038 12/11/15 0339      Alphonsa Overall, MD, FACS Pager: [facility] Office: [phone] 12/27/2015

## 2015-12-27 NOTE — Procedures (Signed)
Ultrasound-guided diagnostic and therapeutic right thoracentesis performed yielding 1.8 liters of yellow colored fluid. No immediate complications. Follow-up chest x-ray pending.      Nakota Ackert E 2:01 PM 12/27/2015

## 2015-12-27 NOTE — Progress Notes (Addendum)
Patient requesting to have food as getting hungry, called [place] for pt schedule for thoracenthesis ,no need for NPO as per Ebony from Ultrasound unit.

## 2015-12-28 MED ORDER — ONDANSETRON 4 MG PO TBDP: 4.0000 mg | ORAL_TABLET | Freq: Four times a day (QID) | ORAL | 0 refills | Status: DC | PRN

## 2015-12-28 MED ORDER — SODIUM CHLORIDE 0.9 % IJ SOLN: INTRAMUSCULAR | 1 refills | Status: DC

## 2015-12-28 NOTE — Progress Notes (Signed)
Patient discharged to home with instructions and prescriptions given.

## 2015-12-28 NOTE — Discharge Summary (Signed)
[name] Discharge Summary   Patient ID: [name] MRN: [id number] DOB/AGE: 64/28/1952 64 y.o.  Admit date: 12/11/2015 Discharge date: 12/28/2015  Admitting Diagnosis: Biloma following [name] S/p cholecystectomy Bile leak  Discharge Diagnosis Patient Active Problem List   Diagnosis Date Noted  . Bacteremia   . Biloma   . Biloma following [name]   . Status post cholecystectomy   . Infection by Streptococcus, viridans group   . Routine screening for STI (sexually transmitted infection)   . Bile leak, postoperative   . Sepsis ([place]) 12/11/2015  . Symptomatic cholelithiasis 12/05/2015  . HLD (hyperlipidemia) 04/02/2015  . Chronic migraine without aura, with status migrainosus 09/26/2014  . Dizziness 11/09/2013  . Adjustment disorder with mixed anxiety and depressed mood 11/03/2013  . Migraine variant 10/10/2013  . Disturbance of skin sensation 10/10/2013  . TIA (transient ischemic attack) 10/10/2013  . HTN (hypertension) 07/18/2013  . 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 Neuralgia, neuritis, and radiculitis, unspecified 02/04/2012  . 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 Myofascial muscle pain 07/15/2011    Consultants Dr. Corrie Mckusick DO, IR Alcide Evener MD, ID  Imaging: Dg Chest 1 View  Result Date: 12/27/2015 CLINICAL DATA:  Thoracentesis. EXAM: CHEST 1 VIEW COMPARISON:  12/10/2015. FINDINGS: No evidence of pneumothorax post thoracentesis. Interim reduction in right pleural effusion. Bibasilar subsegmental atelectasis. Heart size normal. IMPRESSION: No evidence of pneumothorax post thoracentesis. Electronically Signed   By: Marcello Moores  Register   On: 12/27/2015 14:18   US Thoracentesis Asp Pleural Space W/img Guide  Result Date: 12/27/2015 INDICATION: History of a biloma with large right pleural effusion. Request is made for diagnostic and therapeutic thoracentesis. EXAM: ULTRASOUND GUIDED DIAGNOSTIC AND THERAPEUTIC THORACENTESIS  MEDICATIONS: 1% lidocaine. COMPLICATIONS: None immediate. PROCEDURE: An ultrasound guided thoracentesis was thoroughly discussed with the patient and questions answered. The benefits, risks, alternatives and complications were also discussed. The patient understands and wishes to proceed with the procedure. Written consent was obtained. Ultrasound was performed to localize and mark an adequate pocket of fluid in the right chest. The area was then prepped and draped in the normal sterile fashion. 1% Lidocaine was used for local anesthesia. Under ultrasound guidance a Safe-T-Centesis catheter was introduced. Thoracentesis was performed. The catheter was removed and a dressing applied. FINDINGS: A total of approximately 1.8 L of yellow fluid was removed. Samples were sent to the laboratory as requested by the clinical team. IMPRESSION: Successful ultrasound guided right thoracentesis yielding 1.8 L of pleural fluid. Read by: Saverio Danker, PA-C Electronically Signed   By: Sandi Mariscal M.D.   On: 12/27/2015 14:07   CT abdomen pelvis w contrast 12/26/15: 1. Percutaneous drainage catheter overlying the right lobe of liver is in place with significant decrease in volume of subcapsular fluid collection. There has also been resolution of fluid collection within the gallbladder fossa. 2. Biliary stent is in place. Pneumobilia is present indicating biliary patency. 3. Increase in volume of right pleural effusion. 4. Aortic atherosclerosis  CT abdomen pelvis w contrast 12/15/15: Interval placement of percutaneous drainage catheter within the gallbladder fossa with decrease of the previously demonstrated biloma which now measures 5.6 x 2.3 cm (prior measurement of 7.2 x 3.8 cm ). This collection is contiguous with stable in size subcapsular hepatic collection which measures 11.2 x 5.4 x 13.2 cm. Common bile duct metallic stent in satisfactory position. Slightly increased in size bilateral pleural effusions, right  greater than left, with right lower lobe segmental atelectasis. Advanced calcific atherosclerotic disease of the  coronary arteries and distal abdominal aorta.  CT abdomen pelvis w contrast 12/11/15: Status post recent cholecystectomy with fluid collection within the gallbladder fossa contiguous with hepatic subcapsular 5.8 x 11.6 x 15.7 cm fluid collection compatible with biloma. Superimposed infection possible. Small amount of free fluid in the pelvis. Small to moderate RIGHT pleural effusion, trace on the LEFT with compressive atelectasis. Severe atherosclerosis.  ERCP 12/12/15: ERCP demonstrates evidence of biliary leak at the inferior liver margin, with subsequent placement of biliary stent.  HIDA 12/11/15: No focal concentration of radiotracer at the inferior liver margin to confirm biliary leak on the current study.  Procedures Dr. Pascal Lux (12/11/15) - RUQ biloma drain placed  Dr. Loletha Carrow (12/12/15) - ERCP with plastic stent placement in CBD Dr. Vernard Gambles (12/17/15) - perihepatic biloma drain placed Saverio Danker PA (12/27/15) - u/s guided right pleural thoracentesis  Hospital Course:  [name] is a 65yo female s/p laparoscopic cholecystectomy with IOC 12/06/15 by Dr. Barry Dienes who presented to [name] 12/11/15 with 2 days of worsening abdominal pain, confusion, and fevers.  Workup showed a large fluid collection in the RUQ consistent with a biloma.  Patient was septic and therefore started on zosyn and admitted to the ICU for close monitoring.  IR was consulted and placed a RUQ drain which had ~290cc bilous fluid. HIDA scan showed no active leak, but ERCP was performed and confirmed bile leak therefore a plastic stent was placed in the common bile duct 12/12/15. Blood cultures showed gram positive and gram negative rods, Viridans streptococcus therefore infectious disease was consulted and recommended rocephin IV and flagyl po. TEE was performed and negative for endocarditis. Repeat blood cultures  12/14/15 showed no growth.   Patient continued to have abdominal pain and high volume output from drain. CT was repeated 12/15/15 and showed decreased size of gallbladder fossa fluid collection that was drained, but no change in the subcapsular hepatic collection. IR evaluated and placed a new biloma drain in this fluid collection 12/17/15. Patient did well after this procedure. Both drains had high output of bilous fluid but were slowly decreasing in amount. Octreotide was added on 12/19/15 but did not make a big difference in drain output and only caused muscle cramps, therefore it was stopped 12/24/15. Per infectious disease she was continued on antibiotics through 12/24/15 and then they were stopped. Ms. Mesaros also had intermittent nausea during admission related to different medications; this was controlled with scheduled zofran. She was also intermittently hypokalemic which responded to PO and IV potassium supplementation.   Drain output very slowly continued to decrease therefore it was decided to leave the stent in place and not insert a larger one. CT was repeated on 12/26/15 and showed improvement in intra-abdominal fluid collections, but it also showed persistent and enlarging right pleural effusion. Patient underwent thoracentesis of the right pleural effusion on 12/27/15 with 1.8L of yellow fluid output; follow-up chest xray showed no pneumothorax; cultures showed no growth. Her WBC normalized and she was afebrile.   On 12/28/15 patient was stable, tolerating diet, ambulating well, pain well controlled, vital signs stable, and felt stable for discharge home. Both drains will remain in place and she will follow-up with Dr. Barry Dienes in 1 week, and with the drain clinic in 2 weeks.  Physical Exam: Gen: Alert, NAD, pleasant Pulm: effort normal, CTAB Abd: Soft, mildly distended, mild tenderness around drains, +BS, drain sites cdi with bilous output in bags  RUQ drain 745cc/24hr R abd drain  115cc/24hr    Medication  List    TAKE these medications   atorvastatin 40 MG tablet Commonly known as:  LIPITOR Take 1 tablet (40 mg total) by mouth daily.   clopidogrel 75 MG tablet Commonly known as:  PLAVIX TAKE 1 TABLET BY MOUTH EVERY DAY   doxepin 100 MG capsule Commonly known as:  SINEQUAN Take 2 capsules (200 mg total) by mouth at bedtime.   furosemide 20 MG tablet Commonly known as:  LASIX Take 10 mg by mouth daily.   gabapentin 600 MG tablet Commonly known as:  NEURONTIN Take 1 tablet (600 mg total) by mouth 2 (two) times daily.   lidocaine 5 % Commonly known as:  LIDODERM Place 2 patches onto the skin daily. Remove & Discard patch within 12 hours or as directed by MD   meclizine 25 MG tablet Commonly known as:  ANTIVERT Take 1 tablet (25 mg total) by mouth 2 (two) times daily as needed for dizziness.   ondansetron 4 MG disintegrating tablet Commonly known as:  ZOFRAN-ODT Take 1 tablet (4 mg total) by mouth every 6 (six) hours as needed for nausea or vomiting. What changed:  reasons to take this   oxyCODONE 5 MG immediate release tablet Commonly known as:  Oxy IR/ROXICODONE Take 1-2 tablets (5-10 mg total) by mouth every 4 (four) hours as needed for moderate pain.   ramipril 2.5 MG capsule Commonly known as:  ALTACE TAKE ONE CAPSULE BY MOUTH EVERY DAY   sertraline 50 MG tablet Commonly known as:  ZOLOFT TAKE 1 TABLET (50 MG TOTAL) BY MOUTH 2 (TWO) TIMES DAILY.   sodium chloride 0.9 % injection Flush drain with 59mL twice daily   tiZANidine 2 MG tablet Commonly known as:  ZANAFLEX Take 1 tablet (2 mg total) by mouth 3 (three) times daily.   topiramate 100 MG tablet Commonly known as:  TOPAMAX TAKE 1 TABLET TWICE A DAY        Follow-up Information    BYERLY,FAERA, MD. Call today.   Specialty:  General [name] Why:  1 week Contact information: [place] [address] [phone]           Signed: Jerrye Beavers, [name] 12/28/2015, 2:14 PM Pager: [phone] Consults: [phone] Mon-Fri 7:00 am-4:30 pm Sat-Sun 7:00 am-11:30 am  Agree with above.  Alphonsa Overall, MD, [name] Pager: [phone] Office phone:  [phone]

## 2015-12-28 NOTE — Discharge Instructions (Signed)
Bulb Drain Home Care A bulb drain consists of a thin rubber tube and a soft, round bulb that creates a gentle suction. The rubber tube is placed in the area where you had surgery. A bulb is attached to the end of the tube that is outside the body. The bulb drain removes excess fluid that normally builds up in a surgical wound after surgery. The color and amount of fluid will vary. Immediately after surgery, the fluid is bright red and is a little thicker than water. It may gradually change to a yellow or pink color and become more thin and water-like. When the amount decreases to about 1 or 2 tbsp in 24 hours, your health care provider will usually remove it. DAILY CARE  Keep the bulb flat (compressed) at all times, except while emptying it. The flatness creates suction. You can flatten the bulb by squeezing it firmly in the middle and then closing the cap.  Keep sites where the tube enters the skin dry and covered with a bandage (dressing).  Secure the tube 1-2 in (2.5-5.1 cm) below the insertion sites to keep it from pulling on your stitches. The tube is stitched in place and will not slip out.  Secure the bulb as directed by your health care provider.  For the first 3 days after surgery, there usually is more fluid in the bulb. Empty the bulb whenever it becomes half full because the bulb does not create enough suction if it is too full. The bulb could also overflow. Write down how much fluid you remove each time you empty your drain. Add up the amount removed in 24 hours.  Empty the bulb at the same time every day once the amount of fluid decreases and you only need to empty it once a day. Write down the amounts and the 24-hour totals to give to your health care provider. This helps your health care provider know when the tubes can be removed. EMPTYING THE BULB DRAIN Before emptying the bulb, get a measuring cup, a piece of paper and a pen, and wash your hands.  Gently run your fingers down the  tube (stripping) to empty any drainage from the tubing into the bulb. This may need to be done several times a day to clear the tubing of clots and tissue.  Open the bulb cap to release suction, which causes it to inflate. Do not touch the inside of the cap.  Gently run your fingers down the tube (stripping) to empty any drainage from the tubing into the bulb.  Hold the cap out of the way, and pour fluid into the measuring cup.   Squeeze the bulb to provide suction.  Replace the cap.   Check the tape that holds the tube to your skin. If it is becoming loose, you can remove the loose piece of tape and apply a new one. Then, pin the bulb to your shirt.   Write down the amount of fluid you emptied out. Write down the date and each time you emptied your bulb drain. (If there are 2 bulbs, note the amount of drainage from each bulb and keep the totals separate. Your health care provider will want to know the total amounts for each drain and which tube is draining more.)   Flush the fluid down the toilet and wash your hands.   Call your health care provider once you have less than 2 tbsp of fluid collecting in the bulb drain every 24 hours. If  there is drainage around the tube site, change dressings and keep the area dry. Cleanse around tube with sterile saline and place dry gauze around site. This gauze should be changed when it is soiled. If it stays clean and unsoiled, it should still be changed daily.  SEEK MEDICAL CARE IF:  Your drainage has a bad smell or is cloudy.   You have a fever.   Your drainage is increasing instead of decreasing.   Your tube fell out.   You have redness or swelling around the tube site.   You have drainage from a surgical wound.   Your bulb drain will not stay flat after you empty it.  MAKE SURE YOU:   Understand these instructions.  Will watch your condition.  Will get help right away if you are not doing well or get worse.   This  information is not intended to replace advice given to you by your health care provider. Make sure you discuss any questions you have with your health care provider.   Document Released: 02/22/2000 Document Revised: 03/17/2014 Document Reviewed: 09/13/2014 Elsevier Interactive Patient Education [employer].

## 2015-12-28 NOTE — Progress Notes (Signed)
Patient ID: [name], female   DOB: 1951/02/11, 65 y.o.   MRN: [id number]  [facility] Surgery Progress Note  9 Days Post-Op  Subjective: Feeling great today. Much better since thoracentesis. Denies SOB. Also feels better since restarting zoloft.   Objective: Vital signs in last 24 hours: Temp:  [97.8 F (36.6 C)-99 F (37.2 C)] 97.8 F (36.6 C) (10/20 0515) Pulse Rate:  [90-106] 90 (10/20 0515) Resp:  [16] 16 (10/20 0515) BP: (129-139)/(72-77) 134/77 (10/20 0515) SpO2:  [95 %-100 %] 97 % (10/20 0515) Last BM Date: 12/26/15  Intake/Output from previous day: 10/19 0701 - 10/20 0700 In: 2297.5 [P.O.:420; I.V.:1867.5] Out: 1860 [Urine:1000; Drains:860] Intake/Output this shift: No intake/output data recorded.  PE: Gen: Alert, NAD, pleasant Pulm: effort normal, CTAB Abd: Soft, mildly distended, mild tenderness around drains, +BS, drain sites cdi with bilous output in bags  RUQ drain 745cc/24hr R abd drain 115cc/24hr  Lab Results:   Recent Labs  12/27/15 1530  WBC 7.5  HGB 11.4*  HCT 35.7*  PLT 330   BMET  Recent Labs  12/26/15 0511 12/27/15 1530  NA 137  --   K 2.9* 4.1  CL 99*  --   CO2 29  --   GLUCOSE 117*  --   BUN 8  --   CREATININE 0.81  --   CALCIUM 7.9*  --    PT/INR No results for input(s): LABPROT, INR in the last 72 hours. CMP     Component Value Date/Time   NA 137 12/26/2015 0511   K 4.1 12/27/2015 1530   CL 99 (L) 12/26/2015 0511   CO2 29 12/26/2015 0511   GLUCOSE 117 (H) 12/26/2015 0511   BUN 8 12/26/2015 0511   CREATININE 0.81 12/26/2015 0511   CREATININE 1.17 (H) 07/13/2015 1056   CALCIUM 7.9 (L) 12/26/2015 0511   PROT 5.6 (L) 12/25/2015 0534   ALBUMIN 2.4 (L) 12/25/2015 0534   AST 24 12/25/2015 0534   ALT 18 12/25/2015 0534   ALKPHOS 137 (H) 12/25/2015 0534   BILITOT 0.4 12/25/2015 0534   GFRNONAA >60 12/26/2015 0511   GFRNONAA 50 (L) 07/13/2015 1056   GFRAA >60 12/26/2015 0511   GFRAA 57 (L) 07/13/2015  1056   Lipase     Component Value Date/Time   LIPASE 47 12/16/2015 0241       Studies/Results: Dg Chest 1 View  Result Date: 12/27/2015 CLINICAL DATA:  Thoracentesis. EXAM: CHEST 1 VIEW COMPARISON:  12/10/2015. FINDINGS: No evidence of pneumothorax post thoracentesis. Interim reduction in right pleural effusion. Bibasilar subsegmental atelectasis. Heart size normal. IMPRESSION: No evidence of pneumothorax post thoracentesis. Electronically Signed   By: Blakely   On: 12/27/2015 14:18   Ct Abdomen Pelvis W Contrast  Result Date: 12/26/2015 CLINICAL DATA:  Status post biliary stent  drainage EXAM: CT ABDOMEN AND PELVIS WITH CONTRAST TECHNIQUE: Multidetector CT imaging of the abdomen and pelvis was performed using the standard protocol following bolus administration of intravenous contrast. CONTRAST:  100 cc of Isovue 300 COMPARISON:  12/15/2015 FINDINGS: Lower chest: There is a large right pleural effusion which appears increased in volume from previous exam. Compressive type atelectasis of the entire right lower lobe noted. There is also atelectasis involving the right middle lobe, image number 16 of series 3. Hepatobiliary: Percutaneous drainage catheter is situated within the subcapsular fluid collection overlying the anterior lateral right lobe of liver. The fluid collection has decreased in volume from previous exam. Currently 5.2 x 1.4 cm, image  27 of series 2. Previously 11.2 x 5.4 cm. A second drainage catheter is again identified within the gallbladder fossa. There has been resolution of the fluid collection within this area. Biliary stent is identified. Pneumobilia is identified compatible with ongoing biliary patency. Pancreas: Unremarkable. No pancreatic ductal dilatation or surrounding inflammatory changes. Spleen: Normal in size without focal abnormality. Adrenals/Urinary Tract: Adrenal glands are unremarkable. Kidneys are normal, without renal calculi, focal lesion, or  hydronephrosis. Bladder is unremarkable. Stomach/Bowel: The stomach is within normal limits. The small bowel loops have a normal course and caliber. No obstruction. Normal appearance of the colon. Vascular/Lymphatic: Aortic atherosclerosis. No enlarged retroperitoneal or mesenteric adenopathy. No enlarged pelvic or inguinal lymph nodes. Reproductive: Status post hysterectomy. No adnexal masses. Other: No abdominal wall hernia or abnormality. No abdominopelvic ascites. 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 IMPRESSION: 1. Percutaneous drainage catheter overlying the right lobe of liver is in place with significant decrease in volume of subcapsular fluid collection. There has also been resolution of fluid collection within the gallbladder fossa. 2. Biliary stent is in place. Pneumobilia is present indicating biliary patency. 3. Increase in volume of right pleural effusion. 4. Aortic atherosclerosis Electronically Signed   By: Kerby Moors M.D.   On: 12/26/2015 11:55   US Thoracentesis Asp Pleural Space W/img Guide  Result Date: 12/27/2015 INDICATION: History of a biloma with large right pleural effusion. Request is made for diagnostic and therapeutic thoracentesis. EXAM: ULTRASOUND GUIDED DIAGNOSTIC AND THERAPEUTIC THORACENTESIS MEDICATIONS: 1% lidocaine. COMPLICATIONS: None immediate. PROCEDURE: An ultrasound guided thoracentesis was thoroughly discussed with the patient and questions answered. The benefits, risks, alternatives and complications were also discussed. The patient understands and wishes to proceed with the procedure. Written consent was obtained. Ultrasound was performed to localize and mark an adequate pocket of fluid in the right chest. The area was then prepped and draped in the normal sterile fashion. 1% Lidocaine was used for local anesthesia. Under ultrasound guidance a Safe-T-Centesis catheter was introduced. Thoracentesis was performed. The catheter was removed  and a dressing applied. FINDINGS: A total of approximately 1.8 L of yellow fluid was removed. Samples were sent to the laboratory as requested by the clinical team. IMPRESSION: Successful ultrasound guided right thoracentesis yielding 1.8 L of pleural fluid. Read by: Saverio Danker, PA-C Electronically Signed   By: Sandi Mariscal M.D.   On: 12/27/2015 14:07    Anti-infectives: Anti-infectives    Start     Dose/Rate Route Frequency Ordered Stop   12/14/15 1445  cefTRIAXone (ROCEPHIN) 2 g in dextrose 5 % 50 mL IVPB  Status:  Discontinued     2 g 100 mL/hr over 30 Minutes Intravenous Every 24 hours 12/14/15 1435 12/25/15 1834   12/14/15 1445  metroNIDAZOLE (FLAGYL) tablet 500 mg     500 mg Oral Every 8 hours 12/14/15 1435 12/24/15 2359   12/12/15 0930  vancomycin (VANCOCIN) IVPB 1000 mg/200 mL premix  Status:  Discontinued     1,000 mg 200 mL/hr over 60 Minutes Intravenous Every 12 hours 12/12/15 0913 12/14/15 1435   12/11/15 0800  piperacillin-tazobactam (ZOSYN) IVPB 3.375 g  Status:  Discontinued     3.375 g 12.5 mL/hr over 240 Minutes Intravenous Every 8 hours 12/11/15 0536 12/14/15 1435   12/11/15 0045  piperacillin-tazobactam (ZOSYN) IVPB 3.375 g     3.375 g 100 mL/hr over 30 Minutes Intravenous  Once 12/11/15 0038 12/11/15 0339       Assessment/Plan S/p Procedure(s): ENDOSCOPIC RETROGRADE CHOLANGIOPANCREATOGRAPHY (ERCP),  BILIARY  STENT PLACEMENT (10 F by 7cm) on 12/12/2015 by Dr. Loletha Carrow - For post-op bile leak s/p lac chole w/ IOC 12/06/15, Dr. Barry Dienes - RUQ drain placed 12/12/15. Total drain output 115cc/24hr - culture: viridans streptococcus, antibiotic therapy completed - second, perihepatic drain placed 12/17/15. Total drain output 745cc/24hr - octreotide stopped - WBC 7.5  Right pleural effusion  - S/p thoracentesis with 1800cc removed from right pleural space - no SOB  Hypokalemia - resolved, 4.1  TEE - negative for endocarditis  FEN: regular ID: Zosyn 10/3>>10/5, vanc  10/4>>10/6,  Rocephin 10/6 >>10/17, Flagyl 10/6 >>10/16 VTE: SCD's, Lovenox  Plan: feeling well today and ready for discharge. Both drains will remain in place, flush with 5cc BID. F/u 1 week with Dr. Barry Dienes, 2 weeks with drain clinic per IR.    LOS: 17 days    Jerrye Beavers , [facility] Surgery 12/28/2015, 8:10 AM Pager: [phone] Consults: [phone] Mon-Fri 7:00 am-4:30 pm Sat-Sun 7:00 am-11:30 am  Agree with above. Hs done well with pleurocentesis. Ready to go home.  Alphonsa Overall, MD, [facility] Surgery Pager: [phone] Office phone:  [phone]

## 2015-12-31 ENCOUNTER — Ambulatory Visit (HOSPITAL_COMMUNITY): Payer: BLUE CROSS/BLUE SHIELD

## 2015-12-31 ENCOUNTER — Encounter (HOSPITAL_COMMUNITY): Payer: Self-pay

## 2015-12-31 ENCOUNTER — Emergency Department (HOSPITAL_COMMUNITY): Payer: BLUE CROSS/BLUE SHIELD

## 2015-12-31 ENCOUNTER — Inpatient Hospital Stay (HOSPITAL_COMMUNITY): Payer: BLUE CROSS/BLUE SHIELD

## 2015-12-31 ENCOUNTER — Inpatient Hospital Stay (HOSPITAL_COMMUNITY)
Admission: EM | Admit: 2015-12-31 | Discharge: 2016-01-14 | DRG: 871 | Disposition: A | Discharge status: Home / Self Care | Payer: BLUE CROSS/BLUE SHIELD | Attending: General Surgery | Admitting: General Surgery

## 2015-12-31 DIAGNOSIS — E876 Hypokalemia: Secondary | ICD-10-CM | POA: Diagnosis present

## 2015-12-31 DIAGNOSIS — R945 Abnormal results of liver function studies: Secondary | ICD-10-CM

## 2015-12-31 DIAGNOSIS — B370 Candidal stomatitis: Secondary | ICD-10-CM | POA: Diagnosis present

## 2015-12-31 DIAGNOSIS — K219 Gastro-esophageal reflux disease without esophagitis: Secondary | ICD-10-CM | POA: Diagnosis present

## 2015-12-31 DIAGNOSIS — A409 Streptococcal sepsis, unspecified: Secondary | ICD-10-CM

## 2015-12-31 DIAGNOSIS — Z803 Family history of malignant neoplasm of breast: Secondary | ICD-10-CM

## 2015-12-31 DIAGNOSIS — K9189 Other postprocedural complications and disorders of digestive system: Secondary | ICD-10-CM | POA: Diagnosis not present

## 2015-12-31 DIAGNOSIS — N183 Chronic kidney disease, stage 3 (moderate): Secondary | ICD-10-CM | POA: Diagnosis present

## 2015-12-31 DIAGNOSIS — E785 Hyperlipidemia, unspecified: Secondary | ICD-10-CM | POA: Diagnosis present

## 2015-12-31 DIAGNOSIS — R0602 Shortness of breath: Secondary | ICD-10-CM

## 2015-12-31 DIAGNOSIS — K831 Obstruction of bile duct: Secondary | ICD-10-CM

## 2015-12-31 DIAGNOSIS — R509 Fever, unspecified: Secondary | ICD-10-CM | POA: Diagnosis not present

## 2015-12-31 DIAGNOSIS — R600 Localized edema: Secondary | ICD-10-CM | POA: Diagnosis not present

## 2015-12-31 DIAGNOSIS — Z79899 Other long term (current) drug therapy: Secondary | ICD-10-CM

## 2015-12-31 DIAGNOSIS — Z87891 Personal history of nicotine dependence: Secondary | ICD-10-CM | POA: Diagnosis not present

## 2015-12-31 DIAGNOSIS — G8912 Acute post-thoracotomy pain: Secondary | ICD-10-CM | POA: Diagnosis not present

## 2015-12-31 DIAGNOSIS — F419 Anxiety disorder, unspecified: Secondary | ICD-10-CM | POA: Diagnosis present

## 2015-12-31 DIAGNOSIS — A09 Infectious gastroenteritis and colitis, unspecified: Secondary | ICD-10-CM | POA: Diagnosis not present

## 2015-12-31 DIAGNOSIS — Z820 Family history of epilepsy and other diseases of the nervous system: Secondary | ICD-10-CM

## 2015-12-31 DIAGNOSIS — R262 Difficulty in walking, not elsewhere classified: Secondary | ICD-10-CM | POA: Diagnosis present

## 2015-12-31 DIAGNOSIS — I69351 Hemiplegia and hemiparesis following cerebral infarction affecting right dominant side: Secondary | ICD-10-CM | POA: Diagnosis not present

## 2015-12-31 DIAGNOSIS — A0472 Enterocolitis due to Clostridium difficile, not specified as recurrent: Secondary | ICD-10-CM | POA: Diagnosis present

## 2015-12-31 DIAGNOSIS — K838 Other specified diseases of biliary tract: Secondary | ICD-10-CM

## 2015-12-31 DIAGNOSIS — E784 Other hyperlipidemia: Secondary | ICD-10-CM | POA: Diagnosis not present

## 2015-12-31 DIAGNOSIS — F329 Major depressive disorder, single episode, unspecified: Secondary | ICD-10-CM | POA: Diagnosis present

## 2015-12-31 DIAGNOSIS — J189 Pneumonia, unspecified organism: Secondary | ICD-10-CM | POA: Diagnosis not present

## 2015-12-31 DIAGNOSIS — I10 Essential (primary) hypertension: Secondary | ICD-10-CM | POA: Diagnosis not present

## 2015-12-31 DIAGNOSIS — G43909 Migraine, unspecified, not intractable, without status migrainosus: Secondary | ICD-10-CM | POA: Diagnosis present

## 2015-12-31 DIAGNOSIS — R7989 Other specified abnormal findings of blood chemistry: Secondary | ICD-10-CM | POA: Diagnosis not present

## 2015-12-31 DIAGNOSIS — Z9689 Presence of other specified functional implants: Secondary | ICD-10-CM

## 2015-12-31 DIAGNOSIS — J90 Pleural effusion, not elsewhere classified: Secondary | ICD-10-CM

## 2015-12-31 DIAGNOSIS — Z7902 Long term (current) use of antithrombotics/antiplatelets: Secondary | ICD-10-CM

## 2015-12-31 DIAGNOSIS — J440 Chronic obstructive pulmonary disease with acute lower respiratory infection: Secondary | ICD-10-CM | POA: Diagnosis present

## 2015-12-31 DIAGNOSIS — Z91013 Allergy to seafood: Secondary | ICD-10-CM | POA: Diagnosis not present

## 2015-12-31 DIAGNOSIS — Z809 Family history of malignant neoplasm, unspecified: Secondary | ICD-10-CM

## 2015-12-31 DIAGNOSIS — Y95 Nosocomial condition: Secondary | ICD-10-CM | POA: Diagnosis not present

## 2015-12-31 DIAGNOSIS — A414 Sepsis due to anaerobes: Principal | ICD-10-CM | POA: Diagnosis present

## 2015-12-31 DIAGNOSIS — R5082 Postprocedural fever: Secondary | ICD-10-CM

## 2015-12-31 DIAGNOSIS — E872 Acidosis: Secondary | ICD-10-CM | POA: Diagnosis present

## 2015-12-31 DIAGNOSIS — N179 Acute kidney failure, unspecified: Secondary | ICD-10-CM

## 2015-12-31 DIAGNOSIS — I129 Hypertensive chronic kidney disease with stage 1 through stage 4 chronic kidney disease, or unspecified chronic kidney disease: Secondary | ICD-10-CM | POA: Diagnosis present

## 2015-12-31 DIAGNOSIS — A419 Sepsis, unspecified organism: Secondary | ICD-10-CM

## 2015-12-31 DIAGNOSIS — R197 Diarrhea, unspecified: Secondary | ICD-10-CM

## 2015-12-31 DIAGNOSIS — Z452 Encounter for adjustment and management of vascular access device: Secondary | ICD-10-CM

## 2015-12-31 DIAGNOSIS — Z8042 Family history of malignant neoplasm of prostate: Secondary | ICD-10-CM

## 2015-12-31 DIAGNOSIS — Z801 Family history of malignant neoplasm of trachea, bronchus and lung: Secondary | ICD-10-CM

## 2015-12-31 DIAGNOSIS — Z9049 Acquired absence of other specified parts of digestive tract: Secondary | ICD-10-CM

## 2015-12-31 DIAGNOSIS — Z9889 Other specified postprocedural states: Secondary | ICD-10-CM

## 2015-12-31 LAB — BODY FLUID CELL COUNT WITH DIFFERENTIAL
Eos, Fluid: 6 %
Lymphs, Fluid: 23 %
Monocyte-Macrophage-Serous Fluid: 18 % — ABNORMAL LOW (ref 50–90)
Neutrophil Count, Fluid: 53 % — ABNORMAL HIGH (ref 0–25)
Total Nucleated Cell Count, Fluid: 1388 cu mm — ABNORMAL HIGH (ref 0–1000)

## 2015-12-31 LAB — COMPREHENSIVE METABOLIC PANEL
ALT: 29 U/L (ref 14–54)
AST: 45 U/L — ABNORMAL HIGH (ref 15–41)
Albumin: 2.4 g/dL — ABNORMAL LOW (ref 3.5–5.0)
Alkaline Phosphatase: 196 U/L — ABNORMAL HIGH (ref 38–126)
Anion gap: 11 (ref 5–15)
BUN: 12 mg/dL (ref 6–20)
CO2: 22 mmol/L (ref 22–32)
Calcium: 8.1 mg/dL — ABNORMAL LOW (ref 8.9–10.3)
Chloride: 102 mmol/L (ref 101–111)
Creatinine, Ser: 1.17 mg/dL — ABNORMAL HIGH (ref 0.44–1.00)
GFR calc Af Amer: 56 mL/min — ABNORMAL LOW (ref >60)
GFR calc non Af Amer: 48 mL/min — ABNORMAL LOW (ref >60)
Glucose, Bld: 137 mg/dL — ABNORMAL HIGH (ref 65–99)
Potassium: 3.3 mmol/L — ABNORMAL LOW (ref 3.5–5.1)
Sodium: 135 mmol/L (ref 135–145)
Total Bilirubin: 0.7 mg/dL (ref 0.3–1.2)
Total Protein: 6.3 g/dL — ABNORMAL LOW (ref 6.5–8.1)

## 2015-12-31 LAB — CBC WITH DIFFERENTIAL/PLATELET
Basophils Absolute: 0 10*3/uL (ref 0.0–0.1)
Basophils Relative: 0 %
Eosinophils Absolute: 0 10*3/uL (ref 0.0–0.7)
Eosinophils Relative: 0 %
HCT: 35.4 % — ABNORMAL LOW (ref 36.0–46.0)
Hemoglobin: 11.6 g/dL — ABNORMAL LOW (ref 12.0–15.0)
Lymphocytes Relative: 3 %
Lymphs Abs: 0.5 10*3/uL — ABNORMAL LOW (ref 0.7–4.0)
MCH: 28.5 pg (ref 26.0–34.0)
MCHC: 32.8 g/dL (ref 30.0–36.0)
MCV: 87 fL (ref 78.0–100.0)
Monocytes Absolute: 0.9 10*3/uL (ref 0.1–1.0)
Monocytes Relative: 5 %
Neutro Abs: 15.4 10*3/uL — ABNORMAL HIGH (ref 1.7–7.7)
Neutrophils Relative %: 92 %
Platelets: 297 10*3/uL (ref 150–400)
RBC: 4.07 MIL/uL (ref 3.87–5.11)
RDW: 14.9 % (ref 11.5–15.5)
WBC: 16.8 10*3/uL — ABNORMAL HIGH (ref 4.0–10.5)

## 2015-12-31 LAB — URINALYSIS, ROUTINE W REFLEX MICROSCOPIC
Bilirubin Urine: NEGATIVE
Glucose, UA: NEGATIVE mg/dL
Ketones, ur: NEGATIVE mg/dL
Leukocytes, UA: NEGATIVE
Nitrite: NEGATIVE
Protein, ur: 30 mg/dL — AB
Specific Gravity, Urine: 1.022 (ref 1.005–1.030)
pH: 6 (ref 5.0–8.0)

## 2015-12-31 LAB — GLUCOSE, SEROUS FLUID: Glucose, Fluid: 140 mg/dL

## 2015-12-31 LAB — GRAM STAIN

## 2015-12-31 LAB — C DIFFICILE QUICK SCREEN W PCR REFLEX
C Diff antigen: POSITIVE — AB
C Diff toxin: NEGATIVE

## 2015-12-31 LAB — CREATININE, SERUM
Creatinine, Ser: 0.91 mg/dL (ref 0.44–1.00)
GFR calc Af Amer: >60 mL/min (ref >60)
GFR calc non Af Amer: >60 mL/min (ref >60)

## 2015-12-31 LAB — CLOSTRIDIUM DIFFICILE BY PCR: Toxigenic C. Difficile by PCR: POSITIVE — AB

## 2015-12-31 LAB — CBC
HCT: 31.3 % — ABNORMAL LOW (ref 36.0–46.0)
Hemoglobin: 10.1 g/dL — ABNORMAL LOW (ref 12.0–15.0)
MCH: 27.9 pg (ref 26.0–34.0)
MCHC: 32.3 g/dL (ref 30.0–36.0)
MCV: 86.5 fL (ref 78.0–100.0)
Platelets: 236 10*3/uL (ref 150–400)
RBC: 3.62 MIL/uL — ABNORMAL LOW (ref 3.87–5.11)
RDW: 14.8 % (ref 11.5–15.5)
WBC: 13.4 10*3/uL — ABNORMAL HIGH (ref 4.0–10.5)

## 2015-12-31 LAB — URINE MICROSCOPIC-ADD ON

## 2015-12-31 LAB — I-STAT CG4 LACTIC ACID, ED
Lactic Acid, Venous: 1.91 mmol/L (ref 0.5–1.9)
Lactic Acid, Venous: 3.26 mmol/L (ref 0.5–1.9)

## 2015-12-31 MED ORDER — DOXEPIN HCL 100 MG PO CAPS
200.0000 mg | ORAL_CAPSULE | Freq: Every day | ORAL | Status: DC
  Administered 2016-01-01 – 2016-01-13 (×13): 200 mg via ORAL
  Filled 2015-12-31 (×15): qty 2

## 2015-12-31 MED ORDER — OXYCODONE-ACETAMINOPHEN 5-325 MG PO TABS
1.0000 | ORAL_TABLET | ORAL | Status: DC | PRN
  Administered 2016-01-01 – 2016-01-04 (×6): 2 via ORAL
  Administered 2016-01-06: 1 via ORAL
  Administered 2016-01-09: 2 via ORAL
  Filled 2015-12-31 (×6): qty 2
  Filled 2015-12-31: qty 1
  Filled 2015-12-31 (×2): qty 2

## 2015-12-31 MED ORDER — SODIUM CHLORIDE 0.9 % IV BOLUS (SEPSIS)
1000.0000 mL | Freq: Once | INTRAVENOUS | Status: AC
  Administered 2015-12-31: 1000 mL via INTRAVENOUS

## 2015-12-31 MED ORDER — VANCOMYCIN HCL IN DEXTROSE 750-5 MG/150ML-% IV SOLN
750.0000 mg | Freq: Two times a day (BID) | INTRAVENOUS | Status: DC
  Administered 2016-01-01 (×2): 750 mg via INTRAVENOUS
  Filled 2015-12-31 (×4): qty 150

## 2015-12-31 MED ORDER — VANCOMYCIN 50 MG/ML ORAL SOLUTION
125.0000 mg | Freq: Four times a day (QID) | ORAL | Status: DC
  Administered 2016-01-01 – 2016-01-14 (×52): 125 mg via ORAL
  Filled 2015-12-31 (×59): qty 2.5

## 2015-12-31 MED ORDER — VANCOMYCIN HCL 10 G IV SOLR
1500.0000 mg | Freq: Once | INTRAVENOUS | Status: AC
  Administered 2015-12-31: 1500 mg via INTRAVENOUS
  Filled 2015-12-31: qty 1500

## 2015-12-31 MED ORDER — ACETAMINOPHEN 325 MG PO TABS
650.0000 mg | ORAL_TABLET | Freq: Four times a day (QID) | ORAL | Status: DC | PRN
  Administered 2016-01-03 – 2016-01-10 (×2): 650 mg via ORAL
  Filled 2015-12-31 (×2): qty 2

## 2015-12-31 MED ORDER — LIDOCAINE HCL 1 % IJ SOLN
INTRAMUSCULAR | Status: AC
  Filled 2015-12-31: qty 20

## 2015-12-31 MED ORDER — ONDANSETRON HCL 4 MG/2ML IJ SOLN
4.0000 mg | Freq: Once | INTRAMUSCULAR | Status: DC
  Filled 2015-12-31: qty 2

## 2015-12-31 MED ORDER — SERTRALINE HCL 50 MG PO TABS
50.0000 mg | ORAL_TABLET | Freq: Two times a day (BID) | ORAL | Status: DC
  Administered 2015-12-31 – 2016-01-14 (×26): 50 mg via ORAL
  Filled 2015-12-31 (×27): qty 1

## 2015-12-31 MED ORDER — ALPRAZOLAM 0.5 MG PO TABS
0.5000 mg | ORAL_TABLET | Freq: Once | ORAL | Status: AC
  Administered 2015-12-31: 0.5 mg via ORAL
  Filled 2015-12-31: qty 1

## 2015-12-31 MED ORDER — ONDANSETRON 4 MG PO TBDP
4.0000 mg | ORAL_TABLET | Freq: Once | ORAL | Status: AC
  Administered 2015-12-31: 4 mg via ORAL
  Filled 2015-12-31: qty 1

## 2015-12-31 MED ORDER — HEPARIN SODIUM (PORCINE) 5000 UNIT/ML IJ SOLN
5000.0000 [IU] | Freq: Three times a day (TID) | INTRAMUSCULAR | Status: DC
  Administered 2015-12-31 – 2016-01-14 (×40): 5000 [IU] via SUBCUTANEOUS
  Filled 2015-12-31 (×41): qty 1

## 2015-12-31 MED ORDER — SERTRALINE HCL 50 MG PO TABS: 50.0000 mg | ORAL_TABLET | Freq: Every day | ORAL | Status: DC

## 2015-12-31 MED ORDER — CEFEPIME HCL 2 G IJ SOLR
2.0000 g | INTRAMUSCULAR | Status: DC
  Administered 2016-01-01: 2 g via INTRAVENOUS
  Filled 2015-12-31: qty 2

## 2015-12-31 MED ORDER — ACETAMINOPHEN 500 MG PO TABS
1000.0000 mg | ORAL_TABLET | Freq: Once | ORAL | Status: AC
  Administered 2015-12-31: 1000 mg via ORAL
  Filled 2015-12-31: qty 2

## 2015-12-31 MED ORDER — VANCOMYCIN HCL IN DEXTROSE 1-5 GM/200ML-% IV SOLN: 1000.0000 mg | Freq: Once | INTRAVENOUS | Status: DC

## 2015-12-31 MED ORDER — GABAPENTIN 600 MG PO TABS
600.0000 mg | ORAL_TABLET | Freq: Two times a day (BID) | ORAL | Status: DC
  Administered 2015-12-31 – 2016-01-14 (×28): 600 mg via ORAL
  Filled 2015-12-31 (×29): qty 1

## 2015-12-31 MED ORDER — ACETAMINOPHEN 650 MG RE SUPP: 650.0000 mg | Freq: Four times a day (QID) | RECTAL | Status: DC | PRN

## 2015-12-31 MED ORDER — SODIUM CHLORIDE 0.9 % IV SOLN
INTRAVENOUS | Status: DC
  Administered 2016-01-01 – 2016-01-06 (×10): via INTRAVENOUS

## 2015-12-31 MED ORDER — DEXTROSE 5 % IV SOLN
2.0000 g | Freq: Once | INTRAVENOUS | Status: AC
  Administered 2015-12-31: 2 g via INTRAVENOUS
  Filled 2015-12-31: qty 2

## 2015-12-31 MED ORDER — FENTANYL 2500MCG IN NS 250ML (10MCG/ML) PREMIX INFUSION: 10.0000 ug/h | Freq: Once | INTRAVENOUS | Status: DC

## 2015-12-31 MED ORDER — ATORVASTATIN CALCIUM 40 MG PO TABS
40.0000 mg | ORAL_TABLET | Freq: Every day | ORAL | Status: DC
  Filled 2015-12-31: qty 1

## 2015-12-31 MED ORDER — SODIUM CHLORIDE 0.9 % IV BOLUS (SEPSIS)
250.0000 mL | Freq: Once | INTRAVENOUS | Status: AC
Start: 2015-12-31 — End: 2015-12-31
  Administered 2015-12-31: 250 mL via INTRAVENOUS

## 2015-12-31 MED ORDER — TOPIRAMATE 100 MG PO TABS
100.0000 mg | ORAL_TABLET | Freq: Two times a day (BID) | ORAL | Status: DC
  Administered 2015-12-31 – 2016-01-14 (×28): 100 mg via ORAL
  Filled 2015-12-31 (×30): qty 1

## 2015-12-31 MED ORDER — POTASSIUM CHLORIDE CRYS ER 20 MEQ PO TBCR
60.0000 meq | EXTENDED_RELEASE_TABLET | Freq: Two times a day (BID) | ORAL | Status: DC
  Administered 2015-12-31 – 2016-01-03 (×6): 60 meq via ORAL
  Filled 2015-12-31 (×6): qty 3

## 2015-12-31 MED ORDER — SODIUM CHLORIDE 0.9 % IV BOLUS (SEPSIS)
1000.0000 mL | Freq: Once | INTRAVENOUS | Status: AC
  Administered 2016-01-01: 1000 mL via INTRAVENOUS

## 2015-12-31 NOTE — ED Notes (Signed)
Phlebotomy at bedside to retreive second set of blood cultures.

## 2015-12-31 NOTE — Consult Note (Signed)
PULMONARY / CRITICAL CARE MEDICINE   Name: [name] MRN: [id number] DOB: 1950/05/17    ADMISSION DATE:  12/31/2015 CONSULTATION DATE:  12/31/2015  REFERRING MD: Dr. Brantley Stage  CHIEF COMPLAINT:  Need for central line access; sepsis  HISTORY OF PRESENT ILLNESS:   [name] is a 65 y/o F who presented to the ED today for weakness, fever, and copious diarrhea after recent hospitalizations from complications following laparoscopic cholecystectomy 12/06/15. She is having abdominal pain and shortness of breath, as well. Patient lost peripheral IV access and has been tachycardic to 120s/130s, tachypneic to low 30s, hypotensive to 100/58 (usually runs high per husband) with fever to 101.4 F earlier in the day. CCM consulted for central line placement and assistance managing sepsis. ID consulting as well and has recommended testing for C diff and has given antibiotic recommendations.  Of note, pt was hospitalized from 12/11/15 to 12/28/15 with sepsis and was found to have a bile leak and biloma. Leak was treated with biliary stent; biloma treated with percutaneous RUQ drain. She received vanc and zosyn and then rocephin and flagyl po per ID for blood culture that grew both gram positive and negative rods, Viridans streptococcus. TEE negative for endocarditis. Last hospitalization also complicated by R pleural effusion s/p thoracentesis.    PMH significant for s/p lap chole 9/28, HLD, anxiety, depression, CKD stage III, migraines, and CVA in 2015.    PAST MEDICAL HISTORY :  She  has a past medical history of Chronic kidney disease; GERD (gastroesophageal reflux disease); Hyperlipidemia; Migraine headache; Occipital neuralgia; Renal insufficiency; and Stroke ([place]) (10/2013).  PAST SURGICAL HISTORY: She  has a past surgical history that includes Abdominal hysterectomy; Knee surgery; Carpal tunnel release; Elbow surgery; Lung surgery; Bladder repair; Cervical spine surgery; Cholecystectomy (N/A,  12/06/2015); ERCP (N/A, 12/12/2015); biliary stent placement (N/A, 12/12/2015); ir generic historical (12/17/2015); ir generic historical (12/17/2015); ir generic historical (12/17/2015); and TEE without cardioversion (N/A, 12/19/2015).  Allergies  Allergen Reactions  . Shellfish Allergy Anaphylaxis and Other (See Comments)    All seafood    No current facility-administered medications on file prior to encounter.    Current Outpatient Prescriptions on File Prior to Encounter  Medication Sig  . doxepin (SINEQUAN) 100 MG capsule Take 2 capsules (200 mg total) by mouth at bedtime.  . furosemide (LASIX) 20 MG tablet Take 20 mg by mouth daily.   Marland Kitchen gabapentin (NEURONTIN) 600 MG tablet Take 1 tablet (600 mg total) by mouth 2 (two) times daily.  Marland Kitchen lidocaine (LIDODERM) 5 % Place 2 patches onto the skin daily. Remove & Discard patch within 12 hours or as directed by MD  . meclizine (ANTIVERT) 25 MG tablet Take 1 tablet (25 mg total) by mouth 2 (two) times daily as needed for dizziness.  . ramipril (ALTACE) 2.5 MG capsule TAKE ONE CAPSULE BY MOUTH EVERY DAY  . sertraline (ZOLOFT) 50 MG tablet TAKE 1 TABLET (50 MG TOTAL) BY MOUTH 2 (TWO) TIMES DAILY.  Marland Kitchen tiZANidine (ZANAFLEX) 2 MG tablet Take 1 tablet (2 mg total) by mouth 3 (three) times daily. (Patient taking differently: Take 2-4 mg by mouth See admin instructions. Takes 1 tab in am and 2 tabs in pm)  . topiramate (TOPAMAX) 100 MG tablet TAKE 1 TABLET TWICE A DAY  . atorvastatin (LIPITOR) 40 MG tablet Take 1 tablet (40 mg total) by mouth daily. (Patient not taking: Reported on 12/31/2015)  . clopidogrel (PLAVIX) 75 MG tablet TAKE 1 TABLET BY MOUTH EVERY DAY (Patient not  taking: Reported on 12/31/2015)  . ondansetron (ZOFRAN-ODT) 4 MG disintegrating tablet Take 1 tablet (4 mg total) by mouth every 6 (six) hours as needed for nausea or vomiting. (Patient not taking: Reported on 12/31/2015)  . oxyCODONE (OXY IR/ROXICODONE) 5 MG immediate release tablet Take 1-2  tablets (5-10 mg total) by mouth every 4 (four) hours as needed for moderate pain. (Patient not taking: Reported on 12/31/2015)  . sodium chloride 0.9 % injection Flush drain with 66mL twice daily (Patient not taking: Reported on 12/31/2015)    FAMILY HISTORY:  Her indicated that her mother is deceased. She indicated that her father is deceased. She indicated that her brother is alive. She indicated that her maternal grandmother is deceased. She indicated that her maternal grandfather is deceased. She indicated that her paternal grandmother is deceased. She indicated that her paternal grandfather is deceased. She indicated that her son is alive.    SOCIAL HISTORY: She  reports that she quit smoking about 4 years ago. Her smoking use included Cigarettes. She smoked 0.30 packs per day. She has never used smokeless tobacco. She reports that she does not drink alcohol or use drugs.  REVIEW OF SYSTEMS:   Abdominal pain improved. Endorses anxiety. Continues to have copious diarrhea.   SUBJECTIVE:  Patient feels weak and tired.   VITAL SIGNS: BP (!) 100/58   Pulse (!) 121   Temp 100 F (37.8 C) (Oral)   Resp 15   Ht 5\' 7"  (1.702 m)   Wt 159 lb 13.3 oz (72.5 kg)   SpO2 100%   BMI 25.03 kg/m   HEMODYNAMICS:    VENTILATOR SETTINGS:    INTAKE / OUTPUT: I/O last 3 completed shifts: In: 2800 [IV Piggyback:2800] Out: -   PHYSICAL EXAMINATION: General: Tired-appearing female, in NAD Neuro: AOx3 HEENT: NCAT Cardiovascular: Mildly tachycardic Lungs: CTAB, no increased WOB Abdomen: RUQ percutaneous drain and biliary tube with greenish fluid Musculoskeletal: No LE edema Skin: Flushed, warm skin.   LABS:  BMET  Recent Labs Lab 12/25/15 0534 12/26/15 0511 12/27/15 1530 12/31/15 0950 12/31/15 1746  NA 137 137  --  135  --   K 3.0* 2.9* 4.1 3.3*  --   CL 100* 99*  --  102  --   CO2 27 29  --  22  --   BUN 7 8  --  12  --   CREATININE 0.78 0.81  --  1.17* 0.91  GLUCOSE 121*  117*  --  137*  --     Electrolytes  Recent Labs Lab 12/25/15 0534 12/26/15 0511 12/31/15 0950  CALCIUM 8.0* 7.9* 8.1*    CBC  Recent Labs Lab 12/27/15 1530 12/31/15 0950 12/31/15 1746  WBC 7.5 16.8* 13.4*  HGB 11.4* 11.6* 10.1*  HCT 35.7* 35.4* 31.3*  PLT 330 297 236    Coag's No results for input(s): APTT, INR in the last 168 hours.  Sepsis Markers  Recent Labs Lab 12/31/15 1009 12/31/15 1339  LATICACIDVEN 1.91* 3.26*    ABG No results for input(s): PHART, PCO2ART, PO2ART in the last 168 hours.  Liver Enzymes  Recent Labs Lab 12/25/15 0534 12/31/15 0950  AST 24 45*  ALT 18 29  ALKPHOS 137* 196*  BILITOT 0.4 0.7  ALBUMIN 2.4* 2.4*    Cardiac Enzymes No results for input(s): TROPONINI, PROBNP in the last 168 hours.  Glucose No results for input(s): GLUCAP in the last 168 hours.  Imaging Dg Chest 1 View  Result Date: 12/31/2015 CLINICAL DATA:  Status post right-sided thoracentesis. EXAM: CHEST 1 VIEW COMPARISON:  12/31/2015 FINDINGS: The cardiac silhouette, mediastinal and hilar contours are within normal limits and stable. Interval evacuation of the right pleural effusion. No residual pleural effusion is identified. Moderate subsegmental right lower lobe atelectasis. No postprocedural pneumothorax. The left lung is stable. IMPRESSION: Status post right-sided thoracentesis with complete evacuation of the right pleural fluid collection. No postprocedural pneumothorax. Electronically Signed   By: Marijo Sanes M.D.   On: 12/31/2015 15:43   Dg Chest Port 1 View  Result Date: 12/31/2015 CLINICAL DATA:  Fever on and off for 3 days EXAM: PORTABLE CHEST 1 VIEW COMPARISON:  12/27/2015 FINDINGS: Re- accumulated right pleural effusion which could be moderate volume. No underlying air bronchogram. Mild basilar atelectasis. Normal heart size. No pneumothorax. Pigtail pleural catheter seen over the right upper quadrant. IMPRESSION: 1. Re- accumulated right  pleural effusion. 2. Bibasilar atelectasis. Electronically Signed   By: Monte Fantasia M.D.   On: 12/31/2015 10:52   US Thoracentesis Asp Pleural Space W/img Guide  Result Date: 12/31/2015 INDICATION: Fever. Shortness of breath. Recurrent right pleural effusion. Request diagnostic and therapeutic thoracentesis. EXAM: ULTRASOUND GUIDED RIGHT THORACENTESIS MEDICATIONS: None. COMPLICATIONS: None immediate. PROCEDURE: An ultrasound guided thoracentesis was thoroughly discussed with the patient and questions answered. The benefits, risks, alternatives and complications were also discussed. The patient understands and wishes to proceed with the procedure. Written consent was obtained. Ultrasound was performed to localize and mark an adequate pocket of fluid in the right chest. The area was then prepped and draped in the normal sterile fashion. 1% Lidocaine was used for local anesthesia. Under ultrasound guidance a Safe-T-Centesis catheter was introduced. Thoracentesis was performed. The catheter was removed and a dressing applied. FINDINGS: A total of approximately 1 L of hazy, amber colored fluid was removed. Samples were sent to the laboratory as requested by the clinical team. IMPRESSION: Successful ultrasound guided right thoracentesis yielding 1 L of pleural fluid. Read by: Ascencion Dike PA-C Electronically Signed   By: Markus Daft M.D.   On: 12/31/2015 15:28    STUDIES:  To have CT/abdomen pelvis  CULTURES: BCx 10/2 >> Strep Viridans UCx 10/2 >> No Growth Body Fluid Cx >> No Growth BCx 10/23 >> pending UCx 10/23 >> pending Body Fluid Cx >> pending C. Diff 10/23: Ag positive, toxin negative  ANTIBIOTICS: PO vanc 10/23 >> Vancomycin 10/23 >> Cefepime 10/23 >>  SIGNIFICANT EVENTS: 9/28: Cholecystectomy 12/11/15 to 12/28/15: Hospitalized for sepsis 2/2 bactermia  10/23: Re-admitted with concern for sepsis  LINES/TUBES: R IJ CVC 10/23 >> Abdominal drain 10/3 >> Biliary tube 10/9  >>  DISCUSSION: 65-y/o female with fevers x 3 days and copious diarrhea with recent hospitalization for sepsis 2/2 bacteremia after cholecystectomy at the end of September. He has various possible sources for infection, but diarrhea is concerning for C. diff with recent prolonged course of abx. C diff testing resulted as only antigen positive but will continue presumptive treatment given symptoms. Pleuritic fluid, urine and blood cultures pending. Central IV access obtained this evening. Agree with fluid resuscitation and continued broad spectrum antibiotics, as well as PO vanc to cover C diff. ?Transfer to CCM order canceled by time of order review? Feel patient is safe to remain in SDU at this time now that she has reliable IV access.   ASSESSMENT / PLAN:  #Sepsis Placed CVC ID consulting and providing antibiotic recommendations To have CT abdomen/pelvis IVFs NaCl at 100 mL/hr S/p 2.25 L boluses Ordered another 1  L bolus now Continue cefepime and vancomycin (IV and PO) for now; given toxin negative on C diff PCR will not add flagyl Obtain lactic acid Trend WBC  #CKD SCr on admission 0.91 (BL ~ 0.7-1.0) Granular casts on UA Fluid resuscitation in setting of sepsis   #Abdominal pain Treat C diff  #Dyspnea Provide supplemental O2 prn Treat post-thoracotomy pain syndrome  FAMILY  - Updates: Daughter-in-law and husband at bedside. Informed them and patient of lab results available so far, though most pending.   Olene Floss, MD [place], PGY-2 12/31/2015, 11:19 PM  Attending Note:  I have examined patient, reviewed labs, studies and notes. I have discussed the case with Dr Ola Spurr, and I agree with the data and plans as amended above.   65 yo woman with complicated recent hosp course - sepsis, bile leak / biloma and S viridans (? multi-organism) bacteremia 10/3 - 10/20 after lap chole 12/06/15. Drains x 2 placed into biloma, ERCP w biliary stent placed  10/4, completed course IV abx. A R thora 10/19 was cx negative. She was readmitted with fever, malaise, severe weakness, dyspnea. She reported 1 day of severe diarrhea. She underwent repeat r thora, cx data pending, was started on empiric broad abx. C diff PCR was positive, toxin negative so enteral vanco added. PCCM consulted given her sepsis of unclear etiology, AKI and to assist with IV access. A R IJ CVC was placed overnight.  On my eval she is comfortable, is resting on RA. HR 107 and normotensive. Decreased R BS, abd drains appear CDI. Based on C dif toxin results unclear whether colitis is true source but agree with empiric rx C diff for now. Will continue IVF resuscitation, follow S Cr for improvement. Agree with CT abdomen to investigate for other potential source sepsis. Follow R pleural fluid cx, blood cx's.   Independent critical care time is 40 minutes.   PCCM will sign off. Please call us if we can assist in any way.   Baltazar Apo, MD, PhD 01/01/2016, 6:24 AM  Pulmonary and Critical Care [phone] or if no answer [phone]

## 2015-12-31 NOTE — Progress Notes (Signed)
IV team to floor. Unsuccessful at Peripheral IV access.

## 2015-12-31 NOTE — ED Notes (Signed)
MD at bedside. 

## 2015-12-31 NOTE — Progress Notes (Signed)
CCS, M called due to no IV access.

## 2015-12-31 NOTE — Progress Notes (Signed)
Pt has no IV access despite multiple attempts Tachycardic and tachypnea.. C diff positive and has multiple drains after LGB and stent placement for cholecystitis Contacted CCM for help with management of sepsis and assistance with IV access. Will add flagyl for C diff and transfer to ICU.

## 2015-12-31 NOTE — ED Notes (Signed)
Pt. Requesting something for nausea. MD paged.

## 2015-12-31 NOTE — Consult Note (Signed)
Family Medicine Teaching Service Consult Note Intern Pager: [phone]  Patient name: [name] Medical record number: [id number] Date of birth: 09/05/1950 Age: 65 y.o. Gender: female  Primary Care Provider: Georges Lynch, MD  Pt Overview and Major Events to Date:  Admit 10/23  Assessment and Plan: [name] is a 65 y/o F presenting to the ED with fever, weakness, and diarrhea. PMH significant for s/p lap chole 9/28, HLD, anxiety, depression, CKD stage III, migraines, and CVA in 2015.    #Sepsis. Reports febrile to 103 at home.  At initial presentation, mild fever of 100.8.  Tachycardic and tachypneic meeting sepsis criteria with elevated white count of 13.4 and lactic acid of 3.26.   Recently discharged on 10/20.  Lap chole performed on 12/06/2015. Post-op period complicated by development of a biloma and biliary stent was placed.  TEE on 12/19/2015 showing no evidence of SBE.  CT abd/pelvis on 10/18 with drainage of catheter in right liver lobe with significant decrease in biloma.  Incidental finding of increased volume right pleural effusion.  On 12/27/2015 thoracocentesis was performed to address right pleural effusion, drained 2.1 L fluid.  Another thoracentesis was performed this admission after CXR showed re-accumulation of the right pleural effusion. 1L removed. -Admitted by General Surgery (primary) -Cefepime and vancomycin per pharm consult -CT abd/pelvis pending -Pleural fluid cultures pending -BMET, CBC in AM -Tylenol prn -Zofran prn  #Diarrhea.  Patient not discharged on antibiotics. C/o watery diarrhea, yellow in color. Likely secondary to C. Diff. -C. Diff pending -Enteric precautions - ID consulted  #Post-thoracotomy pain syndrome -Continue Neurontin 600 mg bid -Oxycodone 5-325 Q4 prn  #HTN.  Stable. BP on admission 118/76.  -Hold home Ramipril 2.5 mg daily -Consider restart in AM if needed  #History of CVA in 2015. Stable. With reported residual right sided  weakness of right foot and right hand.  Was started on Plavix.  -Hold Plavix for now given risk of bleeding  #LE swelling. With no formal diagnosis of CHF. Last Echo 12/2015 with EF 55-60%.  At home on 20 mg Lasix daily.  -Hold home Lasix for now, given patient euvolemic on exam and concern for sepsis -Reassess fluid status in AM and consider restarting in AM   #Hypokalemia.  K+ 3.3.   - Give K-dur 60 mEq.   - Follow BMET in AM.   #Hyperlipidemia: Last lipid panel: Chol 145, TG 49, HDL 67, LDL 68. -Continue home Lipitor 40 mg daily  #CKD Stage III.  Cr on admission 0.91.  Baseline appears to be 0.7-1.0. -Avoid nephrotoxic agents -Renally dose medications -Monitor BMET in AM  #Migraines -Continue home Topamax 100 mg bid  #Anxiety.  Reports also trouble sleeping.  -Continue home Doxepin 200 mg daily at bedtime  #Depression -Continue home Zoloft 50 mg bid  FEN/GI: clear liquid diet, IV NS @ 100cc/hr PPx: heparin SQ  Disposition: Admitted by general surgery, primary.   HPI: [name] is a 65 year old female presenting to the ED with fever to 103F, weakness, and diarrhea starting yesterday evening. Patient recently had lap chole on 9/28.  Had a prolonged hospital stay due to biloma and found to have right pleural effusion.  She was discharged on the 20th.  Not taking any antibiotics but was not eating well at home.  Denies nausea and vomiting.  Last night she began to have fevers and a temperature to 103.  Felt chills and was very weak, unable to get herself out of bed this morning.  Reports the stools were watery, not bloody.  Reports difficulty breathing and pain in the area where drains were placed.    In the ED, she was tachycardic to 130. She was tachypneic and in respiratory distress. She met sepsis criteria with elevated WBC, fever, and tachycardia. CXR showed re-accumulation of right pleural effusion. IR performed US-guided thoracentesis. She was admitted by general surgery for  further management.   Objective: Temp:  [100.8 F (38.2 C)-101.4 F (38.6 C)] 100.8 F (38.2 C) (10/23 1334) Pulse Rate:  [113-133] 132 (10/23 1549) Resp:  [25-33] 25 (10/23 1549) BP: (100-142)/(53-109) 100/53 (10/23 1549) SpO2:  [94 %-99 %] 98 % (10/23 1549) Weight:  [156 lb (70.8 kg)] 156 lb (70.8 kg) (10/23 0917)   Physical Exam: General: [name], female lying in hospital bed, in NAD HEENT: EOMI, MMM Cardiovascular: RRR, no MRG, 2+ pulses  Respiratory: CTA B/L no wheezing noted Abdomen: obese, soft, NT ND, biliary drain in place covered with dry bandage, green colored fluid present Extremities: warm, well perfused, no edema or tenderness, moves all extremities spontaneously Neuro: [name]  Laboratory:  Recent Labs Lab 12/25/15 0534 12/27/15 1530 12/31/15 0950  WBC 12.8* 7.5 16.8*  HGB 11.5* 11.4* 11.6*  HCT 36.1 35.7* 35.4*  PLT 364 330 297    Recent Labs Lab 12/25/15 0534 12/26/15 0511 12/27/15 1530 12/31/15 0950  NA 137 137  --  135  K 3.0* 2.9* 4.1 3.3*  CL 100* 99*  --  102  CO2 27 29  --  22  BUN 7 8  --  12  CREATININE 0.78 0.81  --  1.17*  CALCIUM 8.0* 7.9*  --  8.1*  PROT 5.6*  --   --  6.3*  BILITOT 0.4  --   --  0.7  ALKPHOS 137*  --   --  196*  ALT 18  --   --  29  AST 24  --   --  45*  GLUCOSE 121* 117*  --  137*   Imaging/Diagnostic Tests: Dg Chest 1 View  Result Date: 12/31/2015 CLINICAL DATA:  Status post right-sided thoracentesis. EXAM: CHEST 1 VIEW COMPARISON:  12/31/2015 FINDINGS: The cardiac silhouette, mediastinal and hilar contours are within normal limits and stable. Interval evacuation of the right pleural effusion. No residual pleural effusion is identified. Moderate subsegmental right lower lobe atelectasis. No postprocedural pneumothorax. The left lung is stable. IMPRESSION: Status post right-sided thoracentesis with complete evacuation of the right pleural fluid collection. No postprocedural pneumothorax. Electronically Signed    By: P.  Gallerani M.D.   On: 12/31/2015 15:43   Dg Chest Port 1 View  Result Date: 12/31/2015 CLINICAL DATA:  Fever on and off for 3 days EXAM: PORTABLE CHEST 1 VIEW COMPARISON:  12/27/2015 FINDINGS: Re- accumulated right pleural effusion which could be moderate volume. No underlying air bronchogram. Mild basilar atelectasis. Normal heart size. No pneumothorax. Pigtail pleural catheter seen over the right upper quadrant. IMPRESSION: 1. Re- accumulated right pleural effusion. 2. Bibasilar atelectasis. Electronically Signed   By: Jonathon  Watts M.D.   On: 12/31/2015 10:52   Us Thoracentesis Asp Pleural Space W/img Guide  Result Date: 12/31/2015 INDICATION: Fever. Shortness of breath. Recurrent right pleural effusion. Request diagnostic and therapeutic thoracentesis. EXAM: ULTRASOUND GUIDED RIGHT THORACENTESIS MEDICATIONS: None. COMPLICATIONS: None immediate. PROCEDURE: An ultrasound guided thoracentesis was thoroughly discussed with the patient and questions answered. The benefits, risks, alternatives and complications were also discussed. The patient understands and wishes to proceed with the procedure. Written consent   was obtained. Ultrasound was performed to localize and mark an adequate pocket of fluid in the right chest. The area was then prepped and draped in the normal sterile fashion. 1% Lidocaine was used for local anesthesia. Under ultrasound guidance a Safe-T-Centesis catheter was introduced. Thoracentesis was performed. The catheter was removed and a dressing applied. FINDINGS: A total of approximately 1 L of hazy, amber colored fluid was removed. Samples were sent to the laboratory as requested by the clinical team. IMPRESSION: Successful ultrasound guided right thoracentesis yielding 1 L of pleural fluid. Read by: Ascencion Dike PA-C Electronically Signed   By: Markus Daft M.D.   On: 12/31/2015 15:28   Lovenia Kim, MD 12/31/2015, 4:35 PM PGY-1, [place] Intern  pager: [phone], text pages welcome  FPTS Upper-Level Resident Addendum  I have independently interviewed and examined the patient. I have discussed the above with the original author and agree with their documentation. My edits for correction/addition/clarification are in blue. Please see also any attending notes.   Hyman Bible, MD PGY-2, [place] Service pager: [phone] (text pages welcome through [place])

## 2015-12-31 NOTE — ED Notes (Signed)
Attempted to call report to 3S 2X and unsuccessful.

## 2015-12-31 NOTE — ED Notes (Signed)
Attempted to collect urine and stool sample. Urine and stool mixed in bedpan.

## 2015-12-31 NOTE — Progress Notes (Signed)
Doctor to the floor to place a central line.

## 2015-12-31 NOTE — ED Notes (Signed)
Patient transported to Ultrasound 

## 2015-12-31 NOTE — Procedures (Signed)
Successful US guided right thoracentesis. Yielded 1L of hazy, amber colored fluid. Pt tolerated procedure well. No immediate complications.  Specimen was sent for labs. CXR ordered.  Ascencion Dike PA-C 12/31/2015 3:26 PM

## 2015-12-31 NOTE — ED Triage Notes (Signed)
BIB [facility] from home. Pt. Had gallbladder removed on 12-06-15. Pt. Reports having complications and being in the hospital til last Friday. Pt. Reports worsening weakness and diarrhea starting today. Pt. Also reports that last Thursday in the hospital "they drained 2.1 L of fluids off of her lungs.EMS reports right sided tenderness and SOB. Gave 2L O2 in field.

## 2015-12-31 NOTE — Progress Notes (Signed)
Pharmacy Antibiotic Note  [name] is a 65 y.o. female admitted on 12/31/2015 with pneumonia.  Pharmacy has been consulted for vancomycin and cefepime dosing. Tmax is 100.8 and WBC is elevated at 16.8. SCr is 1.17. Lactic acid is 1.91.  Plan: - Vancomycin 1500mg  IV x 1 then 750mg  IV Q12H - Cefepime 2gm IV Q24H - F/u renal fxn, C&S, clinical status and trough at SS  Height: 5\' 7"  (170.2 cm) Weight: 156 lb (70.8 kg) IBW/kg (Calculated) : 61.6  Temp (24hrs), Avg:101.1 F (38.4 C), Min:100.8 F (38.2 C), Max:101.4 F (38.6 C)   Recent Labs Lab 12/25/15 0534 12/26/15 0511 12/27/15 1530 12/31/15 0950 12/31/15 1009  WBC 12.8*  --  7.5 16.8*  --   CREATININE 0.78 0.81  --  1.17*  --   LATICACIDVEN  --   --   --   --  1.91*    Estimated Creatinine Clearance: 47.2 mL/min (by C-G formula based on SCr of 1.17 mg/dL (H)).    Allergies  Allergen Reactions  . Shellfish Allergy Anaphylaxis and Other (See Comments)    All seafood    Antimicrobials this admission: Vanc 10/23>> Cefepime 10/23>>  Dose adjustments this admission: N/A  Microbiology results: Pending  Thank you for allowing pharmacy to be a part of this patient's care.  Jermiah Soderman, Rande Lawman 12/31/2015 11:28 AM

## 2015-12-31 NOTE — H&P (Signed)
[name] is an 65 y.o. female.   PCP: Georges Lynch, MD Gen. surgery: Dr. Stark Klein ID:  Dr. Rhina Brack[place]  Chief Complaint: Weakness and diarrhea cannot get out of bed, right sided tenderness and SOB. Patient reports she was fine on Friday and Saturday. Sunday she started running a fever 102.3 and last evening 103. Patient did not want to come back to the emergency department. She started having loose stools last evening she says it smells bad, and this a.m. the diarrhea is uncontrolled. She is weak and could not get out of bed. Her husband called EMS and she was transported to the ED.  HPI: Patient recently hospitalized from 12/11/15 through 12/28/15 with a biloma, status post cholecystectomy 12/06/15 with intraoperative cholangiogram Dr. Stark Klein. During that hospitalization she underwent ERCP with biliary stent placement on 12/12/15 by Dr. Wilfrid Lund. She underwent right lower quadrant drain placement by IR with a 12 French pigtail drain catheter, also on 12/17/15. She developed a bacteremia growing out. Ends group streptococci and was seen by infectious disease. The last note, from Dr. Tommy Medal on 12/20/15 reported 10 days of effective antibiotics he recommended IV Rocephin and oral Flagyl through 12/24/15 and then discontinuing antibiotics. She also developed a right pleural effusion and underwent thoracentesis on 12/27/15. Thoracentesis that time yielded 1.8 L of pleural fluid.  She showed good improvement result and was discharged home on 12/28/15.  Workup in the ED shows fever with MAXIMUM TEMPERATURE of 101.4. Tachypnea with rate up to 33, and tachycardia. CMP shows a slight rise in the creatinine up to 1.17 mild, mild alkaline phosphatase elevation of 196. Lactate of 1.91. WBC of 16.8. There is a left shift. H&H are stable platelets are normal. Blood cultures were obtained in the ED. Chest x-ray shows reaccumulation the right pleural effusion no underlying air bronchograms mild  atelectasis normal heart size no pneumothorax pigtail catheter is in place over the right upper quadrant. We are asked to see.    Past Medical History:  Diagnosis Date  . Chronic kidney disease    stage 3  . GERD (gastroesophageal reflux disease)   . Hyperlipidemia   . Migraine headache   . Occipital neuralgia    Left  . Renal insufficiency   . Stroke [facility]) 10/2013    Past Surgical History:  Procedure Laterality Date  . ABDOMINAL HYSTERECTOMY    . BILIARY STENT PLACEMENT N/A 12/12/2015   Procedure: BILIARY STENT PLACEMENT;  Surgeon: Doran Stabler, MD;  Location: [facility] ENDOSCOPY;  Service: Endoscopy;  Laterality: N/A;  . BLADDER REPAIR    . CARPAL TUNNEL RELEASE    . CERVICAL SPINE SURGERY    . CHOLECYSTECTOMY N/A 12/06/2015   Procedure: LAPAROSCOPIC CHOLECYSTECTOMY WITH  INTRAOPERATIVE CHOLANGIOGRAM;  Surgeon: Stark Klein, MD;  Location: [facility];  Service: General;  Laterality: N/A;  . ELBOW SURGERY    . ERCP N/A 12/12/2015   Procedure: ENDOSCOPIC RETROGRADE CHOLANGIOPANCREATOGRAPHY (ERCP);  Surgeon: Doran Stabler, MD;  Location: [facility] ENDOSCOPY;  Service: Endoscopy;  Laterality: N/A;  . IR GENERIC HISTORICAL  12/17/2015   IR US GUIDE BX ASP/DRAIN 12/17/2015 [facility]INTER[facility]  . IR GENERIC HISTORICAL  12/17/2015   IR GUIDED DRAIN W CATHETER PLACEMENT 12/17/2015 [facility]  . IR GENERIC HISTORICAL  12/17/2015   IR SINUS/FIST TUBE CHK-NON GI 12/17/2015 [facility]  . KNEE SURGERY    . LUNG SURGERY    . TEE WITHOUT CARDIOVERSION N/A 12/19/2015   Procedure: TRANSESOPHAGEAL  ECHOCARDIOGRAM (TEE);  Surgeon: Josue Hector, MD;  Location: [facility] ENDOSCOPY;  Service: Cardiovascular;  Laterality: N/A;    Family History  Problem Relation Age of Onset  . Cancer Mother 39    breast  . Alzheimer's disease Mother   . Cancer Brother   . Hyperlipidemia Brother   . Prostate cancer Father   . Lung cancer Father    Social History:  reports that she quit smoking about 4 years ago. Her smoking use  included Cigarettes. She smoked 0.30 packs per day. She has never used smokeless tobacco. She reports that she does not drink alcohol or use drugs.  Allergies:  Allergies  Allergen Reactions  . Shellfish Allergy Anaphylaxis and Other (See Comments)    All seafood    Prior to Admission medications   Medication Sig Start Date End Date Taking? Authorizing Provider  atorvastatin (LIPITOR) 40 MG tablet Take 1 tablet (40 mg total) by mouth daily. 01/04/15   Elberta Leatherwood, MD  clopidogrel (PLAVIX) 75 MG tablet TAKE 1 TABLET BY MOUTH EVERY DAY 09/04/15   Elberta Leatherwood, MD  doxepin (SINEQUAN) 100 MG capsule Take 2 capsules (200 mg total) by mouth at bedtime. 10/15/14   Kathrynn Ducking, MD  furosemide (LASIX) 20 MG tablet Take 10 mg by mouth daily. 05/09/14   Historical Provider, MD  gabapentin (NEURONTIN) 600 MG tablet Take 1 tablet (600 mg total) by mouth 2 (two) times daily. 12/05/15   Charlett Blake, MD  lidocaine (LIDODERM) 5 % Place 2 patches onto the skin daily. Remove & Discard patch within 12 hours or as directed by MD 11/28/15   Charlett Blake, MD  meclizine (ANTIVERT) 25 MG tablet Take 1 tablet (25 mg total) by mouth 2 (two) times daily as needed for dizziness. 07/16/15   Elberta Leatherwood, MD  ondansetron (ZOFRAN-ODT) 4 MG disintegrating tablet Take 1 tablet (4 mg total) by mouth every 6 (six) hours as needed for nausea or vomiting. 12/28/15   Jerrye Beavers, PA-C  oxyCODONE (OXY IR/ROXICODONE) 5 MG immediate release tablet Take 1-2 tablets (5-10 mg total) by mouth every 4 (four) hours as needed for moderate pain. 12/08/15   Jerrye Beavers, PA-C  ramipril (ALTACE) 2.5 MG capsule TAKE ONE CAPSULE BY MOUTH EVERY DAY 10/01/15   Elberta Leatherwood, MD  sertraline (ZOLOFT) 50 MG tablet TAKE 1 TABLET (50 MG TOTAL) BY MOUTH 2 (TWO) TIMES DAILY. 12/13/15   Elberta Leatherwood, MD  sodium chloride 0.9 % injection Flush drain with 50m twice daily 12/28/15   BJerrye Beavers PA-C  tiZANidine (ZANAFLEX) 2 MG tablet Take 1  tablet (2 mg total) by mouth 3 (three) times daily. 09/12/15   MWard Givens NP  topiramate (TOPAMAX) 100 MG tablet TAKE 1 TABLET TWICE A DAY 05/07/15   CKathrynn Ducking MD     Results for orders placed or performed during the hospital encounter of 12/31/15 (from the past 48 hour(s))  Comprehensive metabolic panel     Status: Abnormal   Collection Time: 12/31/15  9:50 AM  Result Value Ref Range   Sodium 135 135 - 145 mmol/L   Potassium 3.3 (L) 3.5 - 5.1 mmol/L   Chloride 102 101 - 111 mmol/L   CO2 22 22 - 32 mmol/L   Glucose, Bld 137 (H) 65 - 99 mg/dL   BUN 12 6 - 20 mg/dL   Creatinine, Ser 1.17 (H) 0.44 - 1.00 mg/dL   Calcium 8.1 (L) 8.9 -  10.3 mg/dL   Total Protein 6.3 (L) 6.5 - 8.1 g/dL   Albumin 2.4 (L) 3.5 - 5.0 g/dL   AST 45 (H) 15 - 41 U/L   ALT 29 14 - 54 U/L   Alkaline Phosphatase 196 (H) 38 - 126 U/L   Total Bilirubin 0.7 0.3 - 1.2 mg/dL   GFR calc non Af Amer 48 (L) >60 mL/min   GFR calc Af Amer 56 (L) >60 mL/min    Comment: (NOTE) The eGFR has been calculated using the CKD EPI equation. This calculation has not been validated in all clinical situations. eGFR's persistently <60 mL/min signify possible Chronic Kidney Disease.    Anion gap 11 5 - 15  CBC WITH DIFFERENTIAL     Status: Abnormal   Collection Time: 12/31/15  9:50 AM  Result Value Ref Range   WBC 16.8 (H) 4.0 - 10.5 K/uL   RBC 4.07 3.87 - 5.11 MIL/uL   Hemoglobin 11.6 (L) 12.0 - 15.0 g/dL   HCT 35.4 (L) 36.0 - 46.0 %   MCV 87.0 78.0 - 100.0 fL   MCH 28.5 26.0 - 34.0 pg   MCHC 32.8 30.0 - 36.0 g/dL   RDW 14.9 11.5 - 15.5 %   Platelets 297 150 - 400 K/uL   Neutrophils Relative % 92 %   Neutro Abs 15.4 (H) 1.7 - 7.7 K/uL   Lymphocytes Relative 3 %   Lymphs Abs 0.5 (L) 0.7 - 4.0 K/uL   Monocytes Relative 5 %   Monocytes Absolute 0.9 0.1 - 1.0 K/uL   Eosinophils Relative 0 %   Eosinophils Absolute 0.0 0.0 - 0.7 K/uL   Basophils Relative 0 %   Basophils Absolute 0.0 0.0 - 0.1 K/uL  I-Stat CG4  Lactic Acid, ED  (not at  [facility])     Status: Abnormal   Collection Time: 12/31/15 10:09 AM  Result Value Ref Range   Lactic Acid, Venous 1.91 (HH) 0.5 - 1.9 mmol/L   Dg Chest Port 1 View  Result Date: 12/31/2015 CLINICAL DATA:  Fever on and off for 3 days EXAM: PORTABLE CHEST 1 VIEW COMPARISON:  12/27/2015 FINDINGS: Re- accumulated right pleural effusion which could be moderate volume. No underlying air bronchogram. Mild basilar atelectasis. Normal heart size. No pneumothorax. Pigtail pleural catheter seen over the right upper quadrant. IMPRESSION: 1. Re- accumulated right pleural effusion. 2. Bibasilar atelectasis. Electronically Signed   By: Monte Fantasia M.D.   On: 12/31/2015 10:52    Review of Systems  Constitutional: Positive for chills and fever. Negative for diaphoresis, malaise/fatigue and weight loss.  HENT: Negative.   Eyes: Negative.   Respiratory: Positive for shortness of breath.   Cardiovascular: Negative.   Gastrointestinal: Positive for diarrhea. Negative for abdominal pain, blood in stool, constipation, heartburn, melena, nausea and vomiting.  Genitourinary: Negative.   Musculoskeletal: Negative.   Skin: Negative.   Neurological: Positive for weakness.  Endo/Heme/Allergies: Negative.   Psychiatric/Behavioral: Negative.     Blood pressure 114/64, pulse (!) 125, temperature 101.4 F (38.6 C), temperature source Oral, resp. rate (!) 31, height 5' 7"  (1.702 m), weight 70.8 kg (156 lb), SpO2 98 %. Physical Exam  Constitutional: She is oriented to person, place, and time. She appears well-developed and well-nourished.  Ill appearing and febrile,  Just got cleaned up from significant diarrhea.  HENT:  Head: Normocephalic.  Nose: Nose normal.  Mouth/Throat: No oropharyngeal exudate.  Eyes: Right eye exhibits no discharge. Left eye exhibits no discharge. No scleral icterus.  Neck: Neck supple. No JVD present. No tracheal deviation present. No thyromegaly present.   Cardiovascular: Regular rhythm, normal heart sounds and intact distal pulses.   No murmur heard. Respiratory: She has no wheezes.  Short of breath RR up in the 20-30 range, on O2.  Breath sounds down in both bases, right more than the left.  GI: Soft. Bowel sounds are normal. She exhibits no distension and no mass. There is no tenderness. There is no rebound and no guarding.  Having uncontrolled diarrhea. IR drains are in place and draining a green colored fluid. Exact amount is unknown, daughter-in-law reports about 66 ML yesterday  Musculoskeletal: She exhibits no edema.  Lymphadenopathy:    She has no cervical adenopathy.  Neurological: She is alert and oriented to person, place, and time.  Skin: Skin is warm and dry. No rash noted. No erythema. No pallor.  Psychiatric: She has a normal mood and affect. Her behavior is normal. Thought content normal.     Assessment/Plan Sepsis - uncertain etiology S/p laparoscopic cholecystectomy 12/06/15. 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 Wilfrid Lund; IR drain placement 12/17/15 Uncontrolled diarrhea x 24 hours [place] Streptococcus Bacteremia - Zosyn/Vancomycin 10 days; Rocephin/Flagyl completed 12/24/15 - Dr. Tommy Medal Recurrent right pleural effusion;  Bilateral pleural effusions R>L, s/p right thoracentesis 12/27/15 -m 1.8 L Chronic kidney disease stage III  Creatinine is 1.17 on admit today History of TIA on Plavix GERD Hypertension History of occipital neuralgia Dyslipidemia   Plan: Readmit the patient, she's been started on Maxipime and vancomycin in the ED. ID and Dr. Karolee Ohs has been contacted to consult.  PCR for C. difficile colitis is pending. GI consult has been requested Medicine consult requested    Boniface Goffe, PA-C 12/31/2015, 1:15 PM

## 2015-12-31 NOTE — Consult Note (Signed)
[facility] Consult: 3:13 PM 12/31/2015  LOS: 0 days    Referring Provider: Dr. Sabra Heck  Primary Care Physician:  Georges Lynch, MD Primary Gastroenterologist:  Dr. Earlean Shawl.  Seen by Dr. Silverio Decamp and [facility] during admission earlier this month.    Reason for Consultation:  Fevers, diarrhea.   HPI: [name] is a 65 y.o. female.  Hx ckd stage 3.  Migraines.  CVA.  S/p orthopedic surgeries.   Patient's meds include Plavix.  S/P laparoscopic cholecystectomy on 12/06/15. Developed postoperative biloma S/P 12/12/15 ERCP to address bile leak. Dr. Loletha Carrow placed 10 French, 7 cm plastic stent into the common bile duct. She also had IR place drains to the biloma. 2 drains were placed into the biloma. The drainage slowly decreased. Because of strep viridans bacteremia, she underwent TEE 12/19/15. There was no evidence for SBE. Final CT scan of 12/26/15 showed drainage catheter in region right lobe of liver showing significant decrease in biloma. Biliary stent in good position with pneumobilia present. Incidental finding of increase volume right pleural effusion. S/P right thoracentesis to address right pleural effusion on 12/27/15. Discharged home on 12/28/15, hospital day 17. Meds at discharge included Plavix but no antibiotics. Pt pushed to get home and family feels she overstated how well she was doing. Using walker, and not eating well at home.  No n/v.    Last night she started having fevers and temps up to 103.  Chills.  Very weak, unable to get out of bed this AM.  Watery, not  Bloody stool started this AM.  Difficulty breathing.   Pain in area of drains.  Drainage 300 cc total on Saturday, 25 cc total yesterday She returned to the ED this morning with weakness, diarrhea, fever to 103 last evening. Very weak and  unable to get herself out of bed. Ultrasound thoracentesis completed. Developed nausea that resolved following non-bloody, clear emesis during thora.  No longer nauseated.  No abdominal imaging to date.       Past Medical History:  Diagnosis Date  . Chronic kidney disease    stage 3  . GERD (gastroesophageal reflux disease)   . Hyperlipidemia   . Migraine headache   . Occipital neuralgia    Left  . Renal insufficiency   . Stroke [facility]) 10/2013    Past Surgical History:  Procedure Laterality Date  . ABDOMINAL HYSTERECTOMY    . BILIARY STENT PLACEMENT N/A 12/12/2015   Procedure: BILIARY STENT PLACEMENT;  Surgeon: Doran Stabler, MD;  Location: [facility];  Service: Endoscopy;  Laterality: N/A;  . BLADDER REPAIR    . CARPAL TUNNEL RELEASE    . CERVICAL SPINE SURGERY    . CHOLECYSTECTOMY N/A 12/06/2015   Procedure: LAPAROSCOPIC CHOLECYSTECTOMY WITH  INTRAOPERATIVE CHOLANGIOGRAM;  Surgeon: Stark Klein, MD;  Location: [place];  Service: General;  Laterality: N/A;  . ELBOW SURGERY    . ERCP N/A 12/12/2015   Procedure: ENDOSCOPIC RETROGRADE CHOLANGIOPANCREATOGRAPHY (ERCP);  Surgeon: Doran Stabler, MD;  Location: [facility] ENDOSCOPY;  Service: Endoscopy;  Laterality: N/A;  .  IR GENERIC HISTORICAL  12/17/2015   IR US GUIDE BX ASP/DRAIN 12/17/2015 [facility]  . IR GENERIC HISTORICAL  12/17/2015   IR GUIDED DRAIN W CATHETER PLACEMENT 12/17/2015 [facility]  . IR GENERIC HISTORICAL  12/17/2015   IR SINUS/FIST TUBE CHK-NON GI 12/17/2015 [facility]  . KNEE SURGERY    . LUNG SURGERY    . TEE WITHOUT CARDIOVERSION N/A 12/19/2015   Procedure: TRANSESOPHAGEAL ECHOCARDIOGRAM (TEE);  Surgeon: Josue Hector, MD;  Location: [facility] ENDOSCOPY;  Service: Cardiovascular;  Laterality: N/A;    Prior to Admission medications   Medication Sig Start Date End Date Taking? Authorizing Provider  atorvastatin (LIPITOR) 40 MG tablet Take 1 tablet (40 mg total) by mouth daily. 01/04/15   Elberta Leatherwood, MD    clopidogrel (PLAVIX) 75 MG tablet TAKE 1 TABLET BY MOUTH EVERY DAY 09/04/15   Elberta Leatherwood, MD  doxepin (SINEQUAN) 100 MG capsule Take 2 capsules (200 mg total) by mouth at bedtime. 10/15/14   Kathrynn Ducking, MD  furosemide (LASIX) 20 MG tablet Take 10 mg by mouth daily. 05/09/14   Historical Provider, MD  gabapentin (NEURONTIN) 600 MG tablet Take 1 tablet (600 mg total) by mouth 2 (two) times daily. 12/05/15   Charlett Blake, MD  lidocaine (LIDODERM) 5 % Place 2 patches onto the skin daily. Remove & Discard patch within 12 hours or as directed by MD 11/28/15   Charlett Blake, MD  meclizine (ANTIVERT) 25 MG tablet Take 1 tablet (25 mg total) by mouth 2 (two) times daily as needed for dizziness. 07/16/15   Elberta Leatherwood, MD  ondansetron (ZOFRAN-ODT) 4 MG disintegrating tablet Take 1 tablet (4 mg total) by mouth every 6 (six) hours as needed for nausea or vomiting. 12/28/15   Jerrye Beavers, PA-C  oxyCODONE (OXY IR/ROXICODONE) 5 MG immediate release tablet Take 1-2 tablets (5-10 mg total) by mouth every 4 (four) hours as needed for moderate pain. 12/08/15   Jerrye Beavers, PA-C  ramipril (ALTACE) 2.5 MG capsule TAKE ONE CAPSULE BY MOUTH EVERY DAY 10/01/15   Elberta Leatherwood, MD  sertraline (ZOLOFT) 50 MG tablet TAKE 1 TABLET (50 MG TOTAL) BY MOUTH 2 (TWO) TIMES DAILY. 12/13/15   Elberta Leatherwood, MD  sodium chloride 0.9 % injection Flush drain with 62mL twice daily 12/28/15   Jerrye Beavers, PA-C  tiZANidine (ZANAFLEX) 2 MG tablet Take 1 tablet (2 mg total) by mouth 3 (three) times daily. 09/12/15   Ward Givens, NP  topiramate (TOPAMAX) 100 MG tablet TAKE 1 TABLET TWICE A DAY 05/07/15   Kathrynn Ducking, MD    Scheduled Meds: . lidocaine      . vancomycin  750 mg Intravenous Q12H   Infusions: . [START ON 01/01/2016] ceFEPime (MAXIPIME) IV     PRN Meds:    Allergies as of 12/31/2015 - Review Complete 12/31/2015  Allergen Reaction Noted  . Shellfish allergy Anaphylaxis and Other (See Comments)  08/03/2012    Family History  Problem Relation Age of Onset  . Cancer Mother 29    breast  . Alzheimer's disease Mother   . Cancer Brother   . Hyperlipidemia Brother   . Prostate cancer Father   . Lung cancer Father     Social History   Social History  . Marital status: Married    Spouse name: [name]  . Number of children: 1  . Years of education: 12+   Occupational History  . ACTIVITY DIRECTOR  [employer]  . CNA    Social History Main Topics  . Smoking status: Former Smoker    Packs/day: 0.30    Types: Cigarettes    Quit date: 11/01/2011  . Smokeless tobacco: Never Used  . Alcohol use No  . Drug use: No  . Sexual activity: Yes    Partners: Male    Birth control/ protection: Surgical     Comment: 1 sexual partner in last 12 months: married   Other Topics Concern  . Not on file   Social History Narrative   Patient is married ([name]).   Patient has one child.   Patient is working full-time.   Patient is right handed.   Patient has a college education.   Patient does not drink caffeine.    REVIEW OF SYSTEMS: Constitutional:  Per HPI ENT:  No nose bleeds Pulm:  Per hpi CV:  No palpitations, no LE edema.  GU:  No hematuria, no frequency GI:  Per HPI Heme:  No bleeding   Neuro:  No headaches, no peripheral tingling or numbness.  Severe weakness Derm:  No itching, no rash or sores.  Endocrine:  No sweats or chills.  No polyuria or dysuria Immunization:  Not queried Travel:  None beyond local counties in last few months.    PHYSICAL EXAM: Vital signs in last 24 hours: Vitals:   12/31/15 1334 12/31/15 1435  BP: 109/64 129/73  Pulse: (!) 124 113  Resp: (!) 32 (!) 27  Temp: 100.8 F (38.2 C)    Wt Readings from Last 3 Encounters:  12/31/15 70.8 kg (156 lb)  12/17/15 75.3 kg (166 lb)  12/05/15 75.5 kg (166 lb 6.4 oz)    General: looks ill.  Currently comfortable Head:  No swelling or asymmetry   Eyes:  No icterus or pallor Ears:  Not HOH    Nose:  No discharge, some congestion Mouth:  Moist oral mm.  clear Neck:  No mass, no JVD.   Lungs:  Diminished sounds on right Heart: RRR, tachy. Abdomen:  Soft.  Tender in Right Upper quad.  Drains x 2, both with golden bile but 1 with more murky material.   Rectal: deferred   Musc/Skeltl: no joint deformity or redness Extremities:  Purpura on arms  Neurologic:  Oriented x 3.  No tremor.  Moves all 4s, strength not tested.   Skin:  No rash oir sores Tattoos:  none   Psych:  Affect blunt but cooperative and calm.    Intake/Output from previous day: No intake/output data recorded. Intake/Output this shift: Total I/O In: 1550 [IV Piggyback:1550] Out: -   LAB RESULTS:  Recent Labs  12/31/15 0950  WBC 16.8*  HGB 11.6*  HCT 35.4*  PLT 297   BMET Lab Results  Component Value Date   NA 135 12/31/2015   NA 137 12/26/2015   NA 137 12/25/2015   K 3.3 (L) 12/31/2015   K 4.1 12/27/2015   K 2.9 (L) 12/26/2015   CL 102 12/31/2015   CL 99 (L) 12/26/2015   CL 100 (L) 12/25/2015   CO2 22 12/31/2015   CO2 29 12/26/2015   CO2 27 12/25/2015   GLUCOSE 137 (H) 12/31/2015   GLUCOSE 117 (H) 12/26/2015   GLUCOSE 121 (H) 12/25/2015   BUN 12 12/31/2015   BUN 8 12/26/2015   BUN 7 12/25/2015   CREATININE 1.17 (H) 12/31/2015   CREATININE 0.81 12/26/2015   CREATININE 0.78 12/25/2015   CALCIUM 8.1 (L) 12/31/2015  CALCIUM 7.9 (L) 12/26/2015   CALCIUM 8.0 (L) 12/25/2015   LFT  Recent Labs  12/31/15 0950  PROT 6.3*  ALBUMIN 2.4*  AST 45*  ALT 29  ALKPHOS 196*  BILITOT 0.7   PT/INR Lab Results  Component Value Date   INR 1.42 12/17/2015   INR 1.10 12/11/2015   INR 1.03 12/05/2015   Hepatitis Panel No results for input(s): HEPBSAG, HCVAB, HEPAIGM, HEPBIGM in the last 72 hours. C-Diff No components found for: CDIFF Lipase     Component Value Date/Time   LIPASE 47 12/16/2015 0241    Drugs of Abuse     Component Value Date/Time   LABOPIA NONE DETECTED 10/10/2013  1624   COCAINSCRNUR NONE DETECTED 10/10/2013 1624   LABBENZ POSITIVE (A) 10/10/2013 1624   AMPHETMU NONE DETECTED 10/10/2013 1624   THCU NONE DETECTED 10/10/2013 1624   LABBARB NONE DETECTED 10/10/2013 1624     RADIOLOGY STUDIES: Dg Chest Port 1 View  Result Date: 12/31/2015 CLINICAL DATA:  Fever on and off for 3 days EXAM: PORTABLE CHEST 1 VIEW COMPARISON:  12/27/2015 FINDINGS: Re- accumulated right pleural effusion which could be moderate volume. No underlying air bronchogram. Mild basilar atelectasis. Normal heart size. No pneumothorax. Pigtail pleural catheter seen over the right upper quadrant. IMPRESSION: 1. Re- accumulated right pleural effusion. 2. Bibasilar atelectasis. Electronically Signed   By: Monte Fantasia M.D.   On: 12/31/2015 10:52     IMPRESSION:   *  Fever, diarrhea. Rule out C. Difficile. Rule out infected pleural effusion or Biloma.  Dosed with vanc and maxipime.   *  S/p right thoracentesis.   *  Post laparoscopic cholecystectomy biloma. Complicated postop course. Status post ERCP and plastic CBD stent placement 12/12/15. Status post 2 separate biloma drains.    PLAN:     *  C. difficile has been ordered, not yet collected.  *  S/p thoracentesis. Removed 1 liter fluid, studies sent for labs.   IV vanc and cefepime ordered.   *  Ok to have clears.     Azucena Freed  12/31/2015, 3:13 PM Pager: [phone]

## 2015-12-31 NOTE — ED Provider Notes (Addendum)
[facility] DEPT Provider Note   CSN: [id number] Arrival date & time: 12/31/15  0907     History   Chief Complaint Chief Complaint  Patient presents with  . Fever    POST OPT AND WEAKNESS.    HPI [name] is a 65 y.o. female.  The patient is a 65 year old female who underwent a laparoscopic cholecystectomy and was readmitted with a fever and found to have some biliary leakage with infection and sepsis from both gram-positive and gram-negative rods. The patient was readmitted to the hospital for approximately a month during which time she had several complications including a pleural effusion which was drained and of which there was no positive culture. She went home 2 days ago, developed a fever yesterday and awoke this morning in a pool of her own diarrhea feeling short of breath febrile and so weak that she could not get out of bed. The symptoms are persistent, severe, no associated vomiting or headache, no chest pain coughing abdominal pain back pain or swelling of the legs. Paramedics were called for transport as the patient was too weak to get out of bed          Past Medical History:  Diagnosis Date  . Chronic kidney disease    stage 3  . GERD (gastroesophageal reflux disease)   . Hyperlipidemia   . Migraine headache   . Occipital neuralgia    Left  . Renal insufficiency   . Stroke [facility]) 10/2013    Patient Active Problem List   Diagnosis Date Noted  . Bacteremia   . Biloma   . Biloma following surgery   . Status post cholecystectomy   . Infection by Streptococcus, viridans group   . Routine screening for STI (sexually transmitted infection)   . Bile leak, postoperative   . Sepsis ([place]) 12/11/2015  . Symptomatic cholelithiasis 12/05/2015  . HLD (hyperlipidemia) 04/02/2015  . Chronic migraine without aura, with status migrainosus 09/26/2014  . Dizziness 11/09/2013  . Adjustment disorder with mixed anxiety and depressed mood 11/03/2013  .  Migraine variant 10/10/2013  . Disturbance of skin sensation 10/10/2013  . TIA (transient ischemic attack) 10/10/2013  . HTN (hypertension) 07/18/2013  . 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 Neuralgia, neuritis, and radiculitis, unspecified 02/04/2012  . 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 Myofascial muscle pain 07/15/2011    Past Surgical History:  Procedure Laterality Date  . ABDOMINAL HYSTERECTOMY    . BILIARY STENT PLACEMENT N/A 12/12/2015   Procedure: BILIARY STENT PLACEMENT;  Surgeon: Doran Stabler, MD;  Location: [place] ENDOSCOPY;  Service: Endoscopy;  Laterality: N/A;  . BLADDER REPAIR    . CARPAL TUNNEL RELEASE    . CERVICAL SPINE SURGERY    . CHOLECYSTECTOMY N/A 12/06/2015   Procedure: LAPAROSCOPIC CHOLECYSTECTOMY WITH  INTRAOPERATIVE CHOLANGIOGRAM;  Surgeon: Stark Klein, MD;  Location: [place];  Service: General;  Laterality: N/A;  . ELBOW SURGERY    . ERCP N/A 12/12/2015   Procedure: ENDOSCOPIC RETROGRADE CHOLANGIOPANCREATOGRAPHY (ERCP);  Surgeon: Doran Stabler, MD;  Location[facility] ENDOSCOPY;  Service: Endoscopy;  Laterality: N/A;  . IR GENERIC HISTORICAL  12/17/2015   IR US GUIDE BX ASP/DRAIN 12/17/2015 [facility]INTER[facility]  . IR GENERIC HISTORICAL  12/17/2015   IR GUIDED DRAIN W CATHETER PLACEMENT 12/17/2015 [facility]  . IR GENERIC HISTORICAL  12/17/2015   IR SINUS/FIST TUBE CHK-NON GI 12/17/2015 [facility]  . KNEE SURGERY    . LUNG SURGERY    . TEE WITHOUT CARDIOVERSION  N/A 12/19/2015   Procedure: TRANSESOPHAGEAL ECHOCARDIOGRAM (TEE);  Surgeon: Josue Hector, MD;  Location: [facility] ENDOSCOPY;  Service: Cardiovascular;  Laterality: N/A;    OB History    No data available       Home Medications    Prior to Admission medications   Medication Sig Start Date End Date Taking? Authorizing Provider  atorvastatin (LIPITOR) 40 MG tablet Take 1 tablet (40 mg total) by mouth daily. 01/04/15   Elberta Leatherwood, MD  clopidogrel (PLAVIX) 75 MG tablet TAKE 1 TABLET BY MOUTH EVERY  DAY 09/04/15   Elberta Leatherwood, MD  doxepin (SINEQUAN) 100 MG capsule Take 2 capsules (200 mg total) by mouth at bedtime. 10/15/14   Kathrynn Ducking, MD  furosemide (LASIX) 20 MG tablet Take 10 mg by mouth daily. 05/09/14   Historical Provider, MD  gabapentin (NEURONTIN) 600 MG tablet Take 1 tablet (600 mg total) by mouth 2 (two) times daily. 12/05/15   Charlett Blake, MD  lidocaine (LIDODERM) 5 % Place 2 patches onto the skin daily. Remove & Discard patch within 12 hours or as directed by MD 11/28/15   Charlett Blake, MD  meclizine (ANTIVERT) 25 MG tablet Take 1 tablet (25 mg total) by mouth 2 (two) times daily as needed for dizziness. 07/16/15   Elberta Leatherwood, MD  ondansetron (ZOFRAN-ODT) 4 MG disintegrating tablet Take 1 tablet (4 mg total) by mouth every 6 (six) hours as needed for nausea or vomiting. 12/28/15   Jerrye Beavers, PA-C  oxyCODONE (OXY IR/ROXICODONE) 5 MG immediate release tablet Take 1-2 tablets (5-10 mg total) by mouth every 4 (four) hours as needed for moderate pain. 12/08/15   Jerrye Beavers, PA-C  ramipril (ALTACE) 2.5 MG capsule TAKE ONE CAPSULE BY MOUTH EVERY DAY 10/01/15   Elberta Leatherwood, MD  sertraline (ZOLOFT) 50 MG tablet TAKE 1 TABLET (50 MG TOTAL) BY MOUTH 2 (TWO) TIMES DAILY. 12/13/15   Elberta Leatherwood, MD  sodium chloride 0.9 % injection Flush drain with 51mL twice daily 12/28/15   Jerrye Beavers, PA-C  tiZANidine (ZANAFLEX) 2 MG tablet Take 1 tablet (2 mg total) by mouth 3 (three) times daily. 09/12/15   Ward Givens, NP  topiramate (TOPAMAX) 100 MG tablet TAKE 1 TABLET TWICE A DAY 05/07/15   Kathrynn Ducking, MD    Family History Family History  Problem Relation Age of Onset  . Cancer Mother 80    breast  . Alzheimer's disease Mother   . Cancer Brother   . Hyperlipidemia Brother   . Prostate cancer Father   . Lung cancer Father     Social History Social History  Substance Use Topics  . Smoking status: Former Smoker    Packs/day: 0.30    Types: Cigarettes     Quit date: 11/01/2011  . Smokeless tobacco: Never Used  . Alcohol use No     Allergies   Shellfish allergy   Review of Systems Review of Systems  All other systems reviewed and are negative.    Physical Exam Updated Vital Signs BP 114/64 (BP Location: Left Arm)   Pulse (!) 125   Temp 101.4 F (38.6 C) (Oral)   Resp (!) 31   Ht 5\' 7"  (1.702 m)   Wt 156 lb (70.8 kg)   SpO2 98%   BMI 24.43 kg/m   Physical Exam  Constitutional: She appears well-developed and well-nourished. She appears distressed.  HENT:  Head: Normocephalic and atraumatic.  Mouth/Throat:  Oropharynx is clear and moist. No oropharyngeal exudate.  Eyes: Conjunctivae and EOM are normal. Pupils are equal, round, and reactive to light. Right eye exhibits no discharge. Left eye exhibits no discharge. No scleral icterus.  Neck: Normal range of motion. Neck supple. No JVD present. No thyromegaly present.  Cardiovascular: Regular rhythm, normal heart sounds and intact distal pulses.  Exam reveals no gallop and no friction rub.   No murmur heard. Tachycardic to 130  Pulmonary/Chest: She is in respiratory distress. She has no wheezes. She has no rales.  Mild tachypnea, decreased breath sounds halfway up the right hemithorax, dullness to percussion halfway up the right hemithorax  Abdominal: Soft. Bowel sounds are normal. She exhibits no distension and no mass. There is tenderness ( Minimal tenderness to the right upper quadrant, no peritoneal signs, biliary drains draining green fluid).  Musculoskeletal: Normal range of motion. She exhibits no edema or tenderness.  Lymphadenopathy:    She has no cervical adenopathy.  Neurological: She is alert. Coordination normal.  Skin: Skin is warm and dry. No rash noted. No erythema.  Psychiatric: She has a normal mood and affect. Her behavior is normal.  Nursing note and vitals reviewed.    ED Treatments / Results  Labs (all labs ordered are listed, but only abnormal  results are displayed) Labs Reviewed  COMPREHENSIVE METABOLIC PANEL - Abnormal; Notable for the following:       Result Value   Potassium 3.3 (*)    Glucose, Bld 137 (*)    Creatinine, Ser 1.17 (*)    Calcium 8.1 (*)    Total Protein 6.3 (*)    Albumin 2.4 (*)    AST 45 (*)    Alkaline Phosphatase 196 (*)    GFR calc non Af Amer 48 (*)    GFR calc Af Amer 56 (*)    All other components within normal limits  CBC WITH DIFFERENTIAL/PLATELET - Abnormal; Notable for the following:    WBC 16.8 (*)    Hemoglobin 11.6 (*)    HCT 35.4 (*)    Neutro Abs 15.4 (*)    Lymphs Abs 0.5 (*)    All other components within normal limits  I-STAT CG4 LACTIC ACID, ED - Abnormal; Notable for the following:    Lactic Acid, Venous 1.91 (*)    All other components within normal limits  CULTURE, BLOOD (ROUTINE X 2)  CULTURE, BLOOD (ROUTINE X 2)  URINE CULTURE  C DIFFICILE QUICK SCREEN W PCR REFLEX  BODY FLUID CULTURE  URINALYSIS, ROUTINE W REFLEX MICROSCOPIC (NOT AT [facility])  GLUCOSE, SEROUS FLUID  BODY FLUID CELL COUNT WITH DIFFERENTIAL  I-STAT CG4 LACTIC ACID, ED    EKG  EKG Interpretation  Date/Time:  Monday December 31 2015 09:54:57 EDT Ventricular Rate:  128 PR Interval:    QRS Duration: 71 QT Interval:  265 QTC Calculation: 387 R Axis:   80 Text Interpretation:  Sinus tachycardia Nonspecific T abnormalities, diffuse leads since last tracing no significant change Confirmed by Sabra Heck  MD, Blessed Cotham ([id number]) on 12/31/2015 10:18:28 AM       Radiology Dg Chest Port 1 View  Result Date: 12/31/2015 CLINICAL DATA:  Fever on and off for 3 days EXAM: PORTABLE CHEST 1 VIEW COMPARISON:  12/27/2015 FINDINGS: Re- accumulated right pleural effusion which could be moderate volume. No underlying air bronchogram. Mild basilar atelectasis. Normal heart size. No pneumothorax. Pigtail pleural catheter seen over the right upper quadrant. IMPRESSION: 1. Re- accumulated right pleural effusion. 2. Bibasilar  atelectasis. Electronically Signed   By: Monte Fantasia M.D.   On: 12/31/2015 10:52    Procedures Procedures (including critical care time)  Medications Ordered in ED Medications  ceFEPIme (MAXIPIME) 2 g in dextrose 5 % 50 mL IVPB (not administered)  vancomycin (VANCOCIN) IVPB 750 mg/150 ml premix (not administered)  sodium chloride 0.9 % bolus 1,000 mL (not administered)  sodium chloride 0.9 % bolus 250 mL (not administered)  ceFEPIme (MAXIPIME) 2 g in dextrose 5 % 50 mL IVPB (0 g Intravenous Stopped 12/31/15 1050)  vancomycin (VANCOCIN) 1,500 mg in sodium chloride 0.9 % 500 mL IVPB (1,500 mg Intravenous New Bag/Given 12/31/15 1024)  sodium chloride 0.9 % bolus 1,000 mL (0 mLs Intravenous Stopped 12/31/15 1218)  acetaminophen (TYLENOL) tablet 1,000 mg (1,000 mg Oral Given 12/31/15 1216)     Initial Impression / Assessment and Plan / ED Course  I have reviewed the triage vital signs and the nursing notes.  Pertinent labs & imaging results that were available during my care of the patient were reviewed by me and considered in my medical decision making (see chart for details).  Clinical Course    The patient is critically ill, she appears to have multiple possible sources of infection including a possible empyema, Clostridium difficile colitis, could also be postoperative sepsis, urinary tract infection or intra-abdominal infection. She will need a portable chest x-ray to evaluate the fluid in her chest, also sample blood and urine, culture, lactate, fluid resuscitated if she becomes hypotensive or if she has an elevated lactic acid. The patient is in agreement with the plan, she will need a high level of care.  The pt is septic, source is unclear but has high WBC, fever and tachycardia - she has some SOB which is likely related to pleural effusion / empyema - d/w Dr. Anselm Pancoast of IR who suggests US guided thoracentesis - this has been ordered - will d/w General surgery for admission for  medical admission.  D/w PA Will from General Surgery - will admit Will get Family Practice consult at their request  Fluids 30cc / kg given  Sepsis - Repeat Assessment  Performed at:    1:15 PM  Vitals     Blood pressure 114/64, pulse (!) 125, temperature 101.4 F (38.6 C), temperature source Oral, resp. rate (!) 31, height 5\' 7"  (1.702 m), weight 156 lb (70.8 kg), SpO2 98 %.  Heart:     Tachycardic  Lungs:    CTA and dec sounds at the R base  Capillary Refill:   <2 sec  Peripheral Pulse:   Radial pulse palpable  Skin:     Normal Color   CRITICAL CARE Performed by: Johnna Acosta Total critical care time: 35 minutes Critical care time was exclusive of separately billable procedures and treating other patients. Critical care was necessary to treat or prevent imminent or life-threatening deterioration. Critical care was time spent personally by me on the following activities: development of treatment plan with patient and/or surrogate as well as nursing, discussions with consultants, evaluation of patient's response to treatment, examination of patient, obtaining history from patient or surrogate, ordering and performing treatments and interventions, ordering and review of laboratory studies, ordering and review of radiographic studies, pulse oximetry and re-evaluation of patient's condition.   Final Clinical Impressions(s) / ED Diagnoses   Final diagnoses:  Fever  Sepsis, due to unspecified organism [facility])  Pleural effusion    New Prescriptions New Prescriptions   No medications on file  Noemi Chapel, MD 12/31/15 [place], MD 12/31/15 1350

## 2015-12-31 NOTE — Procedures (Signed)
Central Venous Catheter Insertion Procedure Note [name] [id number] 08-19-1950  Procedure: Insertion of Central Venous Catheter Indications: Assessment of intravascular volume, Drug and/or fluid administration and Frequent blood sampling  Procedure Details Consent: Risks of procedure as well as the alternatives and risks of each were explained to the (patient/caregiver).  Consent for procedure obtained. Time Out: Verified patient identification, verified procedure, site/side was marked, verified correct patient position, special equipment/implants available, medications/allergies/relevent history reviewed, required imaging and test results available.  Performed  Maximum sterile technique was used including antiseptics, cap, gloves, gown, hand hygiene, mask and sheet. Skin prep: Chlorhexidine; local anesthetic administered A antimicrobial bonded/coated triple lumen catheter was placed in the right internal jugular vein using the Seldinger technique.  Evaluation Blood flow good Complications: No apparent complications Patient did tolerate procedure well. Chest X-ray ordered to verify placement.  CXR: pending.  Procedure performed under direct ultrasound guidance for real time vessel cannulation.      Montey Hora, [place] - [facility] Pager: [phone]  or [phone] 12/31/2015, 11:07 PM

## 2015-12-31 NOTE — ED Notes (Signed)
Pt. And family requesting pt. Go to 6N. Jefm Bryant called patient placement, given there is a bed available, placement is aware that this is the requested floor the patient would like to be sent to.

## 2015-12-31 NOTE — ED Notes (Addendum)
Pt transported from dept to Ultrasound

## 2015-12-31 NOTE — Consult Note (Addendum)
[facility] for Infectious Disease  Total days of antibiotics 1        Day 1 Vancomycin        Day 1 Cefepime              Reason for Consult: fever    Referring Physician: Dr. Sabra Heck  Active Problems:   HTN (hypertension)   HLD (hyperlipidemia)   Sepsis ([place])   HPI: [name] is a 65 y.o. 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 During that admission, patient had strep viridans bacteremia on positive blood cultures from 10/2 sensitive to Ceftriaxone. Repeat BCx from 10/6 were no growth. TEE was negative for endocarditis. Patient underwent RUQ drain placement by IR for Biloma. ERCP was preformed which confirmed a bile leak and a plastic stent was placed in the CBD on 10/4 with repeat placement on 10/9. Repeat CT on 10/18 showed improvement of the intra-abdominal fluid collections, but a new right pleural effusion. Patient went for right thoracentesis on 10/19 with 1.8 L of yellow fluid output. R pleural fluid culture showed no growth. Patient was treated from 10/2 to 10/6 with Vancomycin and Zosyn then transitioned to Ceftriaxone and Flagyl on 10/6 and completed antibiotic therapy on 10/18. Plans were for patient to follow up with surgery in one week and the drain clinic in 2 weeks.   Patient was seen and examined in the ED. She states after discharge, she did well, but had increasing shortness of breath over the weekend. However, last night patient developed fever, chills, and explosive, watery foul smelling diarrhea, weakness and malaise. She has had 3 episodes of uncontrollable diarrhea since last night. She had one episode of nausea with water vomit today. She denies associated abdominal pain, dysuria. She also denies dysphagia, sore throat, chest pain, palpitations, or cough. Her  drain output has remained at 25 cc over the last several days.   In the ED, patient was febrile to 101.4, normotensive, tachycardic at 133, tachypneic at 28 and satting 94% on 2 L. She had a new leukocytosis of 16.8 with increased neutrophils (7.5 on discharge), new AKI of 1.17, mildly elevated AST of 45 (24 on d/c), ALT 29 (18 on d/c), and alk phos 196 (137 on d/c). CXR showed reaccumulation of right pleural fluid and patient went for US guided thoracentesis this afternoon. Fluid cultures are pending. Repeat CXR showed complete evacuation of fluid. UA without signs of infection. UCx and BCx are pending. Lactic acid elevated at 1.9>3.2. C. Diff was also collected. Patient was started on Vancomycin and Cefepime.   Past Medical History:  Diagnosis Date  . Chronic kidney disease    stage 3  . GERD (gastroesophageal reflux disease)   . Hyperlipidemia   . Migraine headache   . Occipital neuralgia    Left  . Renal insufficiency   . Stroke ([place]) 10/2013    Allergies:  Allergies  Allergen Reactions  . Shellfish Allergy Anaphylaxis and Other (See Comments)    All seafood    Current antibiotics: Vancomycin 10/23 >> Cefepime 10/23 >>   MEDICATIONS: . heparin  5,000 Units Subcutaneous Q8H  . lidocaine      . vancomycin  750 mg Intravenous Q12H    Social History  Substance Use Topics  . Smoking status: Former Smoker  Packs/day: 0.30    Types: Cigarettes    Quit date: 11/01/2011  . Smokeless tobacco: Never Used  . Alcohol use No    Family History  Problem Relation Age of Onset  . Cancer Mother 83    breast  . Alzheimer's disease Mother   . Cancer Brother   . Hyperlipidemia Brother   . Prostate cancer Father   . Lung cancer Father     Review of Systems: A complete ROS was negative except as per HPI.    OBJECTIVE: Vitals:   12/31/15 1520 12/31/15 1549 12/31/15 1615 12/31/15 1630  BP: 132/64 (!) 100/53 115/66 122/67  Pulse:  (!) 132 (!) 131 (!) 130  Resp:  25 (!) 28  (!) 28  Temp:      TempSrc:      SpO2:  98% 97% 98%  Weight:      Height:       General: Vital signs reviewed.  Patient is ill appearing, in mild acute distress and cooperative with exam.  Eyes: PERRL, conjunctivae normal, no scleral icterus.  Mouth: Normal oropharynx Cardiovascular: Tachycardic, regular rhythm, S1 normal, S2 normal, no murmurs, gallops, or rubs. Pulmonary/Chest: Tachypneic, +inspiratory crackles to the right mid lung, no wheezes, or rhonchi. Abdominal: Soft, non-tender, non-distended, hyperactive BS, no rebound tenderness or guarding present. 2 drains in place with bilious output. Extremities: No lower extremity edema bilaterally, pulses symmetric and intact bilaterally.  Skin: Warm, dry and intact. No rashes or erythema. Psychiatric: Normal mood and affect. speech and behavior is normal. Cognition and memory are normal.   LABS: Results for orders placed or performed during the hospital encounter of 12/31/15 (from the past 48 hour(s))  Comprehensive metabolic panel     Status: Abnormal   Collection Time: 12/31/15  9:50 AM  Result Value Ref Range   Sodium 135 135 - 145 mmol/L   Potassium 3.3 (L) 3.5 - 5.1 mmol/L   Chloride 102 101 - 111 mmol/L   CO2 22 22 - 32 mmol/L   Glucose, Bld 137 (H) 65 - 99 mg/dL   BUN 12 6 - 20 mg/dL   Creatinine, Ser 1.17 (H) 0.44 - 1.00 mg/dL   Calcium 8.1 (L) 8.9 - 10.3 mg/dL   Total Protein 6.3 (L) 6.5 - 8.1 g/dL   Albumin 2.4 (L) 3.5 - 5.0 g/dL   AST 45 (H) 15 - 41 U/L   ALT 29 14 - 54 U/L   Alkaline Phosphatase 196 (H) 38 - 126 U/L   Total Bilirubin 0.7 0.3 - 1.2 mg/dL   GFR calc non Af Amer 48 (L) >60 mL/min   GFR calc Af Amer 56 (L) >60 mL/min    Comment: (NOTE) The eGFR has been calculated using the CKD EPI equation. This calculation has not been validated in all clinical situations. eGFR's persistently <60 mL/min signify possible Chronic Kidney Disease.    Anion gap 11 5 - 15  CBC WITH DIFFERENTIAL     Status: Abnormal     Collection Time: 12/31/15  9:50 AM  Result Value Ref Range   WBC 16.8 (H) 4.0 - 10.5 K/uL   RBC 4.07 3.87 - 5.11 MIL/uL   Hemoglobin 11.6 (L) 12.0 - 15.0 g/dL   HCT 35.4 (L) 36.0 - 46.0 %   MCV 87.0 78.0 - 100.0 fL   MCH 28.5 26.0 - 34.0 pg   MCHC 32.8 30.0 - 36.0 g/dL   RDW 14.9 11.5 - 15.5 %   Platelets 297 150 - 400  K/uL   Neutrophils Relative % 92 %   Neutro Abs 15.4 (H) 1.7 - 7.7 K/uL   Lymphocytes Relative 3 %   Lymphs Abs 0.5 (L) 0.7 - 4.0 K/uL   Monocytes Relative 5 %   Monocytes Absolute 0.9 0.1 - 1.0 K/uL   Eosinophils Relative 0 %   Eosinophils Absolute 0.0 0.0 - 0.7 K/uL   Basophils Relative 0 %   Basophils Absolute 0.0 0.0 - 0.1 K/uL  I-Stat CG4 Lactic Acid, ED  (not at  [facility])     Status: Abnormal   Collection Time: 12/31/15 10:09 AM  Result Value Ref Range   Lactic Acid, Venous 1.91 (HH) 0.5 - 1.9 mmol/L  I-Stat CG4 Lactic Acid, ED  (not at  [facility])     Status: Abnormal   Collection Time: 12/31/15  1:39 PM  Result Value Ref Range   Lactic Acid, Venous 3.26 (HH) 0.5 - 1.9 mmol/L   Comment NOTIFIED PHYSICIAN   Urinalysis, Routine w reflex microscopic (not at [facility])     Status: Abnormal   Collection Time: 12/31/15  1:43 PM  Result Value Ref Range   Color, Urine YELLOW YELLOW   APPearance CLEAR CLEAR   Specific Gravity, Urine 1.022 1.005 - 1.030   pH 6.0 5.0 - 8.0   Glucose, UA NEGATIVE NEGATIVE mg/dL   Hgb urine dipstick TRACE (A) NEGATIVE   Bilirubin Urine NEGATIVE NEGATIVE   Ketones, ur NEGATIVE NEGATIVE mg/dL   Protein, ur 30 (A) NEGATIVE mg/dL   Nitrite NEGATIVE NEGATIVE   Leukocytes, UA NEGATIVE NEGATIVE  Urine microscopic-add on     Status: Abnormal   Collection Time: 12/31/15  1:43 PM  Result Value Ref Range   Squamous Epithelial / LPF 0-5 (A) NONE SEEN   WBC, UA 0-5 0 - 5 WBC/hpf   RBC / HPF 0-5 0 - 5 RBC/hpf   Bacteria, UA FEW (A) NONE SEEN   Casts GRANULAR CAST (A) NEGATIVE  Glucose, Pleural fluid     Status: None   Collection Time: 12/31/15   3:28 PM  Result Value Ref Range   Glucose, Fluid 140 mg/dL    Comment: (NOTE) No normal range established for this test Results should be evaluated in conjunction with serum values    Fluid Type-FGLU FLUID     Comment: RIGHT PLEURAL   Gram stain     Status: None   Collection Time: 12/31/15  3:28 PM  Result Value Ref Range   Specimen Description FLUID RIGHT PLEURAL    Special Requests NONE    Gram Stain      MODERATE WBC PRESENT, PREDOMINANTLY PMN NO ORGANISMS SEEN    Report Status 12/31/2015 FINAL     MICRO: BCx 10/2 >> Strep Viridans UCx 10/2 >> No Growth Body Fluid Cx >> No Growth BCx 10/23 >> pending UCx 10/23 >> pending Body Fluid Cx >> pendin C. Diff 10/23 >> pending  IMAGING: Dg Chest 1 View  Result Date: 12/31/2015 CLINICAL DATA:  Status post right-sided thoracentesis. EXAM: CHEST 1 VIEW COMPARISON:  12/31/2015 FINDINGS: The cardiac silhouette, mediastinal and hilar contours are within normal limits and stable. Interval evacuation of the right pleural effusion. No residual pleural effusion is identified. Moderate subsegmental right lower lobe atelectasis. No postprocedural pneumothorax. The left lung is stable. IMPRESSION: Status post right-sided thoracentesis with complete evacuation of the right pleural fluid collection. No postprocedural pneumothorax. Electronically Signed   By: Marijo Sanes M.D.   On: 12/31/2015 15:43   Dg Chest [facility]  1 View  Result Date: 12/31/2015 CLINICAL DATA:  Fever on and off for 3 days EXAM: PORTABLE CHEST 1 VIEW COMPARISON:  12/27/2015 FINDINGS: Re- accumulated right pleural effusion which could be moderate volume. No underlying air bronchogram. Mild basilar atelectasis. Normal heart size. No pneumothorax. Pigtail pleural catheter seen over the right upper quadrant. IMPRESSION: 1. Re- accumulated right pleural effusion. 2. Bibasilar atelectasis. Electronically Signed   By: Monte Fantasia M.D.   On: 12/31/2015 10:52   US Thoracentesis Asp  Pleural Space W/img Guide  Result Date: 12/31/2015 INDICATION: Fever. Shortness of breath. Recurrent right pleural effusion. Request diagnostic and therapeutic thoracentesis. EXAM: ULTRASOUND GUIDED RIGHT THORACENTESIS MEDICATIONS: None. COMPLICATIONS: None immediate. PROCEDURE: An ultrasound guided thoracentesis was thoroughly discussed with the patient and questions answered. The benefits, risks, alternatives and complications were also discussed. The patient understands and wishes to proceed with the procedure. Written consent was obtained. Ultrasound was performed to localize and mark an adequate pocket of fluid in the right chest. The area was then prepped and draped in the normal sterile fashion. 1% Lidocaine was used for local anesthesia. Under ultrasound guidance a Safe-T-Centesis catheter was introduced. Thoracentesis was performed. The catheter was removed and a dressing applied. FINDINGS: A total of approximately 1 L of hazy, amber colored fluid was removed. Samples were sent to the laboratory as requested by the clinical team. IMPRESSION: Successful ultrasound guided right thoracentesis yielding 1 L of pleural fluid. Read by: Ascencion Dike PA-C Electronically Signed   By: Markus Daft M.D.   On: 12/31/2015 15:28    Assessment/Plan:  [name] is a 65 yo female with PMHx of HTN, HLD, anxiety, depression, and cholelithiasis s/p laparoscopic cholecystectomy with IOC on 12/06/15 who was admitted recently from 10/2 to 10/18 with a large biloma s/p drain placement and right sided pleural effusion s/p thoracentesis who presents with recurrent fevers, shortness of breath, diarrhea and abdominal pain.   Sepsis likely 2/2 C. Difficile: Patient presents with increasing shortness of breath, fever, and explosive, watery, foul smelling diarrhea after completing a 2 week course of flagyl and ceftriaxone for her biloma. In the ED, patient was febrile, tachycardic, tachypneic, with a new leukocytosis, AKI and  lactic acidosis. Possible sources of infection include C. Diff, her right pleural effusion or biloma/intra-abdominal source; however, her clinical presentation is most consistent with C. Difficile. Agree with testing for C. Diff. We will start her empirically on treatment for C. Diff and continue broad spectrum abx with vancomycin and cefepime for now. If pleural fluid cx is unremarkable, would favor changing abx to Zosyn or Unasyn for possible intra-abdominal source. Once C. Diff is positive and no other obvious source of infection, can d/c IV abx and continue po vancomycin.  -Start po vancomycin -Continue Vanc/Cefepime for now (transition to Zosyn versus d/c as above based on results) -BCx 10/23 >> pending -UCx 10/23 >> pending -Body Fluid Cx 10/23 >> pending -C. Difficile PCR ordered -CT abdomen/pelvis ordered by Surgery to evaluate biloma -IVF for hydration  Right Pleural Effusion s/p Thoracentesis: Shortness of breath has improved some since procedure today. CXR showed complete evacuation. Body fluid culture pending. Culture from previous admission was no growth. Doubt source of infection; however, culture is pending. -Fluid Cx 10/23 >> pending -On Vanc/Cefepime for now  Biloma s/p Drain Placement: Completed 2 weeks of IV Ceftriaxone and Flagyl on 10/18. Drain with 25 cc of bilious output per day. No abdominal pain.  -CT abdomen/pelvis pending -Drain management per surgery  Martyn Malay,  DO PGY-3 Internal Medicine Resident Pager # [phone] 12/31/2015 5:22 PM

## 2016-01-01 DIAGNOSIS — R197 Diarrhea, unspecified: Secondary | ICD-10-CM

## 2016-01-01 DIAGNOSIS — A0472 Enterocolitis due to Clostridium difficile, not specified as recurrent: Secondary | ICD-10-CM

## 2016-01-01 DIAGNOSIS — R7989 Other specified abnormal findings of blood chemistry: Secondary | ICD-10-CM

## 2016-01-01 DIAGNOSIS — R945 Abnormal results of liver function studies: Secondary | ICD-10-CM

## 2016-01-01 DIAGNOSIS — E876 Hypokalemia: Secondary | ICD-10-CM

## 2016-01-01 LAB — BASIC METABOLIC PANEL
Anion gap: 6 (ref 5–15)
Anion gap: 6 (ref 5–15)
BUN: 8 mg/dL (ref 6–20)
BUN: 5 mg/dL — ABNORMAL LOW (ref 6–20)
CO2: 22 mmol/L (ref 22–32)
CO2: 20 mmol/L — ABNORMAL LOW (ref 22–32)
Calcium: 6.9 mg/dL — ABNORMAL LOW (ref 8.9–10.3)
Calcium: 7.2 mg/dL — ABNORMAL LOW (ref 8.9–10.3)
Chloride: 107 mmol/L (ref 101–111)
Chloride: 113 mmol/L — ABNORMAL HIGH (ref 101–111)
Creatinine, Ser: 0.79 mg/dL (ref 0.44–1.00)
Creatinine, Ser: 0.8 mg/dL (ref 0.44–1.00)
GFR calc Af Amer: >60 mL/min (ref >60)
GFR calc Af Amer: >60 mL/min (ref >60)
GFR calc non Af Amer: >60 mL/min (ref >60)
GFR calc non Af Amer: >60 mL/min (ref >60)
Glucose, Bld: 126 mg/dL — ABNORMAL HIGH (ref 65–99)
Glucose, Bld: 137 mg/dL — ABNORMAL HIGH (ref 65–99)
Potassium: 2.6 mmol/L — CL (ref 3.5–5.1)
Potassium: 3 mmol/L — ABNORMAL LOW (ref 3.5–5.1)
Sodium: 135 mmol/L (ref 135–145)
Sodium: 139 mmol/L (ref 135–145)

## 2016-01-01 LAB — URINE CULTURE: Culture: NO GROWTH

## 2016-01-01 LAB — CBC
HCT: 28.2 % — ABNORMAL LOW (ref 36.0–46.0)
Hemoglobin: 9.1 g/dL — ABNORMAL LOW (ref 12.0–15.0)
MCH: 27.8 pg (ref 26.0–34.0)
MCHC: 32.3 g/dL (ref 30.0–36.0)
MCV: 86.2 fL (ref 78.0–100.0)
Platelets: 247 10*3/uL (ref 150–400)
RBC: 3.27 MIL/uL — ABNORMAL LOW (ref 3.87–5.11)
RDW: 14.9 % (ref 11.5–15.5)
WBC: 9.5 10*3/uL (ref 4.0–10.5)

## 2016-01-01 LAB — GLUCOSE, CAPILLARY: Glucose-Capillary: 124 mg/dL — ABNORMAL HIGH (ref 65–99)

## 2016-01-01 LAB — PROTIME-INR
INR: 1.28
Prothrombin Time: 16.1 seconds — ABNORMAL HIGH (ref 11.4–15.2)

## 2016-01-01 LAB — CULTURE, BODY FLUID-BOTTLE: Culture: NO GROWTH

## 2016-01-01 LAB — LACTIC ACID, PLASMA: Lactic Acid, Venous: 0.7 mmol/L (ref 0.5–1.9)

## 2016-01-01 LAB — CULTURE, BODY FLUID W GRAM STAIN -BOTTLE

## 2016-01-01 LAB — APTT: aPTT: 37 seconds — ABNORMAL HIGH (ref 24–36)

## 2016-01-01 LAB — PROTEIN, TOTAL: Total Protein: 4.4 g/dL — ABNORMAL LOW (ref 6.5–8.1)

## 2016-01-01 MED ORDER — DEXTROSE 5 % IV SOLN
2.0000 g | INTRAVENOUS | Status: DC
  Filled 2016-01-01: qty 2

## 2016-01-01 MED ORDER — ONDANSETRON HCL 4 MG PO TABS
4.0000 mg | ORAL_TABLET | Freq: Four times a day (QID) | ORAL | Status: DC | PRN
  Administered 2016-01-01 – 2016-01-03 (×3): 4 mg via ORAL
  Filled 2016-01-01 (×4): qty 1

## 2016-01-01 NOTE — Care Management Note (Addendum)
Case Management Note  Patient Details  Name: SHANA KRISHNAMURTHY MRN: [id number] Date of Birth: 1950/12/04  Subjective/Objective:   Patient presents with fever, weakness, diarrhea ( c diff) and sepsis, CVP monitoring she is from home with spouse.  On po Vanc, NCM awaiting benefit check, will need script for po vanc on chart. NCM will cont to follow for dc needs.  Patient has a rolling walker at home, pta she was indep.  Await pt eval.  Patient states if she has to have [facility] services she prefers [facility].  She has medication coverage and she has a pcp and she has transportation at dc.  NCM informed her awaiting benefit check for oral vanc, I am not sure how much more she will have to take when she goes home or if she will be on this when she goes but right now just checking her benefits for the po vanc.  NCM will cont to follow for dc needs.    Per benefit check for vancomycn po 01/02/16  Vancomycin is an approved drug in Liquid form 123ml bottle co pay is 20.00 no one could tell if prior was needed or not .                 Action/Plan:   Expected Discharge Date:                  Expected Discharge Plan:  Home/Self Care  In-House Referral:     Discharge planning Services  CM Consult  Post Acute Care Choice:    Choice offered to:     DME Arranged:    DME Agency:     HH Arranged:    HH Agency:     Status of Service:  In process, will continue to follow  If discussed at Long Length of Stay Meetings, dates discussed:    Additional Comments:  Zenon Mayo, RN 01/01/2016, 4:14 PM

## 2016-01-01 NOTE — Progress Notes (Signed)
MD notified about critical lab K+ 2.6.  Will continue to monitor pt.

## 2016-01-01 NOTE — Progress Notes (Signed)
Spoke with Dr. Ocie Bob in regards to patient transferring to ICU. As of right now, pt is stable enough to stay on the unit. While on the phone, CXR was checked to see if central line was ok to use. Central line ok to use and started IV bolus. Will continue to monitor pt closely.   [name]

## 2016-01-01 NOTE — Progress Notes (Signed)
[facility] for Infectious Disease    Date of Admission:  12/31/2015   Total days of antibiotics 2        Day 2 IV Vancomycin        Day 2 po vancomycin        Day 2 Cefepime   ID: LAMAE SEKERAK is a 65 y.o. female with Ms. Bice is a 65 yo female with PMHx of HTN, HLD, anxiety, depression, and cholelithiasis s/p laparoscopic cholecystectomy with IOC on 12/06/15 who was admitted recently from 10/2 to 10/18 with a large biloma s/p drain placement and right sided pleural effusion s/p thoracentesis who presented on 10/23 with recurrent fevers, shortness of breath, diarrhea and abdominal pain.   Active Problems:   HTN (hypertension)   HLD (hyperlipidemia)   Sepsis ([facility])   Fever   Pleural effusion   Pleural effusion on right   Encounter for central line placement   Diarrhea of presumed infectious origin   LFT elevation   Hypokalemia   Enteritis due to Clostridium difficile   Subjective: Patient was seen and examined this morning. She states she feels very weak. She continues to have loose, uncontrolled, watery, foul smelling diarrhea- 3-5 episodes overnight. She admits to some cramping abdominal pain. She denies shortness of breath or cough.  Medications:  . atorvastatin  40 mg Oral q1800  . ceFEPime (MAXIPIME) IV  2 g Intravenous Q24H  . doxepin  200 mg Oral QHS  . gabapentin  600 mg Oral BID  . heparin  5,000 Units Subcutaneous Q8H  . potassium chloride  60 mEq Oral BID  . sertraline  50 mg Oral BID  . topiramate  100 mg Oral BID  . vancomycin  125 mg Oral Q6H  . vancomycin  750 mg Intravenous Q12H    Objective: Vitals:   12/31/15 1942 01/01/16 0004 01/01/16 0327 01/01/16 0821  BP: (!) 100/58 109/60 122/60 (!) 128/58  Pulse: (!) 121  (!) 102 (!) 103  Resp: 15  (!) 25 (!) 28  Temp: 100 F (37.8 C)  99 F (37.2 C) 99.8 F (37.7 C)  TempSrc: Oral  Oral Oral  SpO2: 100%  95% 96%  Weight:      Height:       General: Vital signs reviewed.  Patient is ill  appearing, in no acute distress and cooperative with exam.  Cardiovascular: Tachycardic, regular rhythm, S1 normal, S2 normal, no murmurs, gallops, or rubs. Pulmonary/Chest: Tachypneic, +inspiratory crackles to the right mid lung, no wheezes, or rhonchi. Abdominal: Soft, tender to palpation in lower quadrants bilaterally, non-distended, hyperactive BS, no rebound tenderness or guarding present. 2 drains in place with bilious output. Extremities: No lower extremity edema bilaterally, pulses symmetric and intact bilaterally.   Lab Results  Recent Labs  12/31/15 0950 12/31/15 1746 01/01/16 0430  WBC 16.8* 13.4*  --   HGB 11.6* 10.1*  --   HCT 35.4* 31.3*  --   NA 135  --  135  K 3.3*  --  2.6*  CL 102  --  107  CO2 22  --  22  BUN 12  --  8  CREATININE 1.17* 0.91 0.79   Liver Panel  Recent Labs  12/31/15 0950  PROT 6.3*  ALBUMIN 2.4*  AST 45*  ALT 29  ALKPHOS 196*  BILITOT 0.7   Microbiology: BCx 10/2 >> Strep Viridans UCx 10/2 >> No Growth Body Fluid Cx >> No Growth BCx 10/23 >> pending UCx 10/23 >>  pending Body Fluid Cx >> pending C. Diff 10/23 >> positive C. Diff antigen, C. Diff + by PCR, toxin negative  Studies/Results: Dg Chest 1 View  Result Date: 12/31/2015 CLINICAL DATA:  Status post right-sided thoracentesis. EXAM: CHEST 1 VIEW COMPARISON:  12/31/2015 FINDINGS: The cardiac silhouette, mediastinal and hilar contours are within normal limits and stable. Interval evacuation of the right pleural effusion. No residual pleural effusion is identified. Moderate subsegmental right lower lobe atelectasis. No postprocedural pneumothorax. The left lung is stable. IMPRESSION: Status post right-sided thoracentesis with complete evacuation of the right pleural fluid collection. No postprocedural pneumothorax. Electronically Signed   By: Marijo Sanes M.D.   On: 12/31/2015 15:43   Dg Chest Port 1 View  Result Date: 01/01/2016 CLINICAL DATA:  Central line placement EXAM:  PORTABLE CHEST 1 VIEW COMPARISON:  12/31/2015 FINDINGS: Interim insertion of right-sided central venous catheter, the tip overlies the proximal right atrium. No right-sided pneumothorax. Bandlike opacities at the right base are slightly decreased. Ill-defined left basilar atelectasis or infiltrates. Stable cardiomediastinal silhouette. Drainage catheters are present in the right upper quadrant. IMPRESSION: 1. Right jugular venous catheter tip overlies the proximal right atrium. No pneumothorax. 2. Slightly improved aeration of the right lung base. Streaky bandlike opacities within both lung bases, atelectasis versus infiltrates, are otherwise unchanged. Electronically Signed   By: Donavan Foil M.D.   On: 01/01/2016 01:25   Dg Chest Port 1 View  Result Date: 12/31/2015 CLINICAL DATA:  Fever on and off for 3 days EXAM: PORTABLE CHEST 1 VIEW COMPARISON:  12/27/2015 FINDINGS: Re- accumulated right pleural effusion which could be moderate volume. No underlying air bronchogram. Mild basilar atelectasis. Normal heart size. No pneumothorax. Pigtail pleural catheter seen over the right upper quadrant. IMPRESSION: 1. Re- accumulated right pleural effusion. 2. Bibasilar atelectasis. Electronically Signed   By: Monte Fantasia M.D.   On: 12/31/2015 10:52   US Thoracentesis Asp Pleural Space W/img Guide  Result Date: 12/31/2015 INDICATION: Fever. Shortness of breath. Recurrent right pleural effusion. Request diagnostic and therapeutic thoracentesis. EXAM: ULTRASOUND GUIDED RIGHT THORACENTESIS MEDICATIONS: None. COMPLICATIONS: None immediate. PROCEDURE: An ultrasound guided thoracentesis was thoroughly discussed with the patient and questions answered. The benefits, risks, alternatives and complications were also discussed. The patient understands and wishes to proceed with the procedure. Written consent was obtained. Ultrasound was performed to localize and mark an adequate pocket of fluid in the right chest. The area  was then prepped and draped in the normal sterile fashion. 1% Lidocaine was used for local anesthesia. Under ultrasound guidance a Safe-T-Centesis catheter was introduced. Thoracentesis was performed. The catheter was removed and a dressing applied. FINDINGS: A total of approximately 1 L of hazy, amber colored fluid was removed. Samples were sent to the laboratory as requested by the clinical team. IMPRESSION: Successful ultrasound guided right thoracentesis yielding 1 L of pleural fluid. Read by: Ascencion Dike PA-C Electronically Signed   By: Markus Daft M.D.   On: 12/31/2015 15:28   Assessment/Plan: Ms. Libbert is a 65 yo female with PMHx of HTN, HLD, anxiety, depression, and cholelithiasis s/p laparoscopic cholecystectomy with IOC on 12/06/15 who was admitted recently from 10/2 to 10/18 with a large biloma s/p drain placement and right sided pleural effusion s/p thoracentesis who presents with recurrent fevers, shortness of breath, diarrhea and abdominal pain.   Sepsis 2/2 C. Difficile: Patient presented with fever, and explosive, watery, foul smelling diarrhea after completing a 2 week course of flagyl and ceftriaxone for her biloma. C. Diff  was positive by PCR and with positive antigen but negative toxin. She was started empirically on po vancomycin yesterday in addition to IV Vancomycin and Cefepime. Patient was afebrile overnight with leukocytosis trending down from 16.8 to 13.4. BCx pending. -Continue po vancomycin 125 mg Q6H -Consider discontinuing Vanc/Cefepime  -BCx 10/23 >> pending -UCx 10/23 >> pending -Body Fluid Cx 10/23 >> pending -IVF for hydration  Right Pleural Effusion s/p Thoracentesis: CXR showed complete evacuation. Body fluid culture pending. Culture from previous admission was no growth. Doubt source of infection; however, culture is pending. -Fluid Cx 10/23 >> pending -On Vanc/Cefepime for now, consider d/c  Biloma s/p Drain Placement: Completed 2 weeks of IV Ceftriaxone  and Flagyl on 10/18. Drain with 25 cc of bilious output per day.  -Drain management per surgery and gastroenterology  Martyn Malay, DO PGY-3 Internal Medicine Resident Pager # [phone] 01/01/2016 9:07 AM

## 2016-01-01 NOTE — Progress Notes (Signed)
Spoke with Dr. Graylon Good and we agree that the patient does not need to be on Cefepime.  Kathryne Eriksson. Dahlia Bailiff, MD, [place] [phone] [facility] Surgery

## 2016-01-01 NOTE — Progress Notes (Signed)
[facility] Progress Note    Subjective: Patient conversant and understanding of current C.diff diagnosis.  DOes not appear to be septic  Objective: Vital signs in last 24 hours: Temp:  [99 F (37.2 C)-101.4 F (38.6 C)] 99.8 F (37.7 C) (10/24 0821) Pulse Rate:  [102-132] 103 (10/24 0821) Resp:  [15-33] 28 (10/24 0821) BP: (100-142)/(53-109) 128/58 (10/24 0821) SpO2:  [95 %-100 %] 96 % (10/24 0821) Weight:  [72.5 kg (159 lb 13.3 oz)] 72.5 kg (159 lb 13.3 oz) (10/23 1800) Last BM Date: 01/01/16  Intake/Output from previous day: 10/23 0701 - 10/24 0700 In: 2800 [IV Piggyback:2800] Out: 150 [Drains:150] Intake/Output this shift: Total I/O In: 683.3 [I.V.:683.3] Out: -   General: Not complaining of pain, mostly diarrhea which is still bad  Lungs: Clear  Abd: Soft, good bowel sounds.  Drain still putting out 150cc bile per day.  Extremities: No changes  Neuro: Intact  Lab Results:  @LABLAST2 (wbc:2,hgb:2,hct:2,plt:2) BMET ) Recent Labs  12/31/15 0950 12/31/15 1746 01/01/16 0430  NA 135  --  135  K 3.3*  --  2.6*  CL 102  --  107  CO2 22  --  22  GLUCOSE 137*  --  126*  BUN 12  --  8  CREATININE 1.17* 0.91 0.79  CALCIUM 8.1*  --  6.9*   PT/INR No results for input(s): LABPROT, INR in the last 72 hours. ABG No results for input(s): PHART, HCO3 in the last 72 hours.  Invalid input(s): PCO2, PO2  Studies/Results: Dg Chest 1 View  Result Date: 12/31/2015 CLINICAL DATA:  Status post right-sided thoracentesis. EXAM: CHEST 1 VIEW COMPARISON:  12/31/2015 FINDINGS: The cardiac silhouette, mediastinal and hilar contours are within normal limits and stable. Interval evacuation of the right pleural effusion. No residual pleural effusion is identified. Moderate subsegmental right lower lobe atelectasis. No postprocedural pneumothorax. The left lung is stable. IMPRESSION: Status post right-sided thoracentesis with complete evacuation of the right pleural fluid collection.  No postprocedural pneumothorax. Electronically Signed   By: Marijo Sanes M.D.   On: 12/31/2015 15:43   Dg Chest Port 1 View  Result Date: 01/01/2016 CLINICAL DATA:  Central line placement EXAM: PORTABLE CHEST 1 VIEW COMPARISON:  12/31/2015 FINDINGS: Interim insertion of right-sided central venous catheter, the tip overlies the proximal right atrium. No right-sided pneumothorax. Bandlike opacities at the right base are slightly decreased. Ill-defined left basilar atelectasis or infiltrates. Stable cardiomediastinal silhouette. Drainage catheters are present in the right upper quadrant. IMPRESSION: 1. Right jugular venous catheter tip overlies the proximal right atrium. No pneumothorax. 2. Slightly improved aeration of the right lung base. Streaky bandlike opacities within both lung bases, atelectasis versus infiltrates, are otherwise unchanged. Electronically Signed   By: Donavan Foil M.D.   On: 01/01/2016 01:25   Dg Chest Port 1 View  Result Date: 12/31/2015 CLINICAL DATA:  Fever on and off for 3 days EXAM: PORTABLE CHEST 1 VIEW COMPARISON:  12/27/2015 FINDINGS: Re- accumulated right pleural effusion which could be moderate volume. No underlying air bronchogram. Mild basilar atelectasis. Normal heart size. No pneumothorax. Pigtail pleural catheter seen over the right upper quadrant. IMPRESSION: 1. Re- accumulated right pleural effusion. 2. Bibasilar atelectasis. Electronically Signed   By: Monte Fantasia M.D.   On: 12/31/2015 10:52   US Thoracentesis Asp Pleural Space W/img Guide  Result Date: 12/31/2015 INDICATION: Fever. Shortness of breath. Recurrent right pleural effusion. Request diagnostic and therapeutic thoracentesis. EXAM: ULTRASOUND GUIDED RIGHT THORACENTESIS MEDICATIONS: None. COMPLICATIONS: None immediate. PROCEDURE: An ultrasound  guided thoracentesis was thoroughly discussed with the patient and questions answered. The benefits, risks, alternatives and complications were also  discussed. The patient understands and wishes to proceed with the procedure. Written consent was obtained. Ultrasound was performed to localize and mark an adequate pocket of fluid in the right chest. The area was then prepped and draped in the normal sterile fashion. 1% Lidocaine was used for local anesthesia. Under ultrasound guidance a Safe-T-Centesis catheter was introduced. Thoracentesis was performed. The catheter was removed and a dressing applied. FINDINGS: A total of approximately 1 L of hazy, amber colored fluid was removed. Samples were sent to the laboratory as requested by the clinical team. IMPRESSION: Successful ultrasound guided right thoracentesis yielding 1 L of pleural fluid. Read by: Ascencion Dike PA-C Electronically Signed   By: Markus Daft M.D.   On: 12/31/2015 15:28    Anti-infectives: Anti-infectives    Start     Dose/Rate Route Frequency Ordered Stop   01/01/16 1100  ceFEPIme (MAXIPIME) 2 g in dextrose 5 % 50 mL IVPB     2 g 100 mL/hr over 30 Minutes Intravenous Every 24 hours 12/31/15 1128     12/31/15 2300  vancomycin (VANCOCIN) IVPB 750 mg/150 ml premix     750 mg 150 mL/hr over 60 Minutes Intravenous Every 12 hours 12/31/15 1128     12/31/15 1800  vancomycin (VANCOCIN) 50 mg/mL oral solution 125 mg     125 mg Oral Every 6 hours 12/31/15 1731     12/31/15 0945  ceFEPIme (MAXIPIME) 2 g in dextrose 5 % 50 mL IVPB     2 g 100 mL/hr over 30 Minutes Intravenous  Once 12/31/15 0932 12/31/15 1050   12/31/15 0945  vancomycin (VANCOCIN) IVPB 1000 mg/200 mL premix  Status:  Discontinued     1,000 mg 200 mL/hr over 60 Minutes Intravenous  Once 12/31/15 0932 12/31/15 0933   12/31/15 0945  vancomycin (VANCOCIN) 1,500 mg in sodium chloride 0.9 % 500 mL IVPB     1,500 mg 250 mL/hr over 120 Minutes Intravenous  Once 12/31/15 0933 12/31/15 1224      Assessment/Plan: s/p  Patient can stay in SDU  WBC increased to 35K, but clinically dong much better.  Will recheck all labs  tomorrow.  As long as she does not develop peritonitis she does not require surgery.  LOS: 1 day   Kathryne Eriksson. Dahlia Bailiff, MD, FACS [phone] [facility] Surgery 01/01/2016

## 2016-01-01 NOTE — Progress Notes (Signed)
Family Medicine Teaching Service Consult Note Intern Pager: [phone]  Patient name: MARCELLINE CORTES Medical record number: [id number] Date of birth: 04-01-1950 Age: 65 y.o. Gender: female  Primary Care Provider: Georges Lynch, MD Consultants: Gastroenterology, Surgery Code Status: Full   Pt Overview and Major Events to Date:  9/28: Laproscopic Cholecystectyomy 10/3-10/20: Biliary stent, R LQ drain, thoracentesis  Assessment and Plan: Mallorey Mulvaney is a 65 y/o F with a past medical history significant HLD, anxiety, depression, CKD stage III, migraines, and CVA s/p laparoscopic cholecysectomy who presented with fever, weakness and diarrhea secondary to C.difficile infection.  #Sepsis, resolving This morning, patient is a mildly tachycardic and tachypneic but is afebrile with a WBC trending down. Thoracentesis was done in the ED by surgery, and fluid appears cloudy but culture are pending. Patient received  2 bolus of IVF overnight by the CCM team and found to be stable with BP 122/60 and Lactic acid this morning is 0.7 down from 3.26. C diff PCR was positive.  --Will continue Cefepime and vancomycin pending pleural fluid cultures results --Pleural fluid cultures pending --Follow up on BMET, CBC --Tylenol 650 mg prn q6 --Zofran 4mg  prn   #Diarrhea, acute Patient C.diff was positive which explains profuse diarrhea. Patient was on prolonged course of vancomycin and zosyn, and then rocephin and flagyl. See by GI this morning, and they are in agreement with ID and will schedule an outpatient appointment as needed --Continue vancomycin 125 mg q6  --Continue Enteric precautions  #Post-thoracotomy pain syndrome -Continue Neurontin 600 mg bid -Oxycodone 5-325 Q4 prn  #HTN, stable  This morning BP 124/61 --Continue to hold home Ramipril 2.5 mg daily --Will restart   #History of CVA in 2015. Stable. With reported residual right sided weakness of right foot and right hand.  Was started on  Plavix.  -Hold Plavix for now given risk of bleeding  #LE swelling. With no formal diagnosis of CHF. Last Echo 12/2015 with EF 55-60%.  At home on 20 mg Lasix daily.  -Hold home Lasix for now, given patient euvolemic on exam and concern for sepsis -Reassess fluid status in AM and consider restarting in AM   #Hypokalemia.  K+ 2.6  - Give K-dur 60 mEq, will continue to replete as needed   - Follow BMET in AM.   #Hyperlipidemia: Last lipid panel: Chol 145, TG 49, HDL 67, LDL 68. -Continue home Lipitor 40 mg daily  #CKD Stage III.  Cr on admission 0.91.  Baseline appears to be 0.7-1.0. -Avoid nephrotoxic agents -Renally dose medications -Monitor BMET in AM  #Migraines -Continue home Topamax 100 mg bid  #Anxiety.  Reports also trouble sleeping.  -Continue home Doxepin 200 mg daily at bedtime  #Depression -Continue home Zoloft 50 mg bi  FEN/GI:  PPx:   Disposition: Admit to stepdown for further medical management   Subjective:  No acute events overnight. Patient is feeling better this morning, she still endorse some diarrhea but denies any abdominal pain, nausea or vomiting.  Objective: Temp:  [99 F (37.2 C)-101.4 F (38.6 C)] 99.8 F (37.7 C) (10/24 0821) Pulse Rate:  [102-133] 103 (10/24 0821) Resp:  [15-33] 28 (10/24 0821) BP: (100-142)/(53-109) 128/58 (10/24 0821) SpO2:  [95 %-100 %] 96 % (10/24 0821) Weight:  [159 lb 13.3 oz (72.5 kg)] 159 lb 13.3 oz (72.5 kg) (10/23 1800) Physical Exam: General: [name], female lying in hospital bed, in NAD HEENT: EOMI, MMM Cardiovascular: RRR, no MRG, 2+ pulses  Respiratory: CTA B/L no wheezing noted Abdomen:  obese, soft, NT ND, biliary drain in place covered with dry bandage,  Extremities: warm, well perfused, no edema or tenderness, moves all extremities spontaneously Neuro: [name]  Laboratory:  Recent Labs Lab 12/27/15 1530 12/31/15 0950 12/31/15 1746  WBC 7.5 16.8* 13.4*  HGB 11.4* 11.6* 10.1*  HCT 35.7* 35.4*  31.3*  PLT 330 297 236    Recent Labs Lab 12/26/15 0511 12/27/15 1530 12/31/15 0950 12/31/15 1746 01/01/16 0430  NA 137  --  135  --  135  K 2.9* 4.1 3.3*  --  2.6*  CL 99*  --  102  --  107  CO2 29  --  22  --  22  BUN 8  --  12  --  8  CREATININE 0.81  --  1.17* 0.91 0.79  CALCIUM 7.9*  --  8.1*  --  6.9*  PROT  --   --  6.3*  --   --   BILITOT  --   --  0.7  --   --   ALKPHOS  --   --  196*  --   --   ALT  --   --  29  --   --   AST  --   --  45*  --   --   GLUCOSE 117*  --  137*  --  126*    Imaging/Diagnostic Tests: Dg Chest 1 View  Result Date: 12/31/2015 CLINICAL DATA:  Status post right-sided thoracentesis. EXAM: CHEST 1 VIEW COMPARISON:  12/31/2015 FINDINGS: The cardiac silhouette, mediastinal and hilar contours are within normal limits and stable. Interval evacuation of the right pleural effusion. No residual pleural effusion is identified. Moderate subsegmental right lower lobe atelectasis. No postprocedural pneumothorax. The left lung is stable. IMPRESSION: Status post right-sided thoracentesis with complete evacuation of the right pleural fluid collection. No postprocedural pneumothorax. Electronically Signed   By: Marijo Sanes M.D.   On: 12/31/2015 15:43   Dg Chest Port 1 View  Result Date: 01/01/2016 CLINICAL DATA:  Central line placement EXAM: PORTABLE CHEST 1 VIEW COMPARISON:  12/31/2015 FINDINGS: Interim insertion of right-sided central venous catheter, the tip overlies the proximal right atrium. No right-sided pneumothorax. Bandlike opacities at the right base are slightly decreased. Ill-defined left basilar atelectasis or infiltrates. Stable cardiomediastinal silhouette. Drainage catheters are present in the right upper quadrant. IMPRESSION: 1. Right jugular venous catheter tip overlies the proximal right atrium. No pneumothorax. 2. Slightly improved aeration of the right lung base. Streaky bandlike opacities within both lung bases, atelectasis versus  infiltrates, are otherwise unchanged. Electronically Signed   By: Donavan Foil M.D.   On: 01/01/2016 01:25   Dg Chest Port 1 View  Result Date: 12/31/2015 CLINICAL DATA:  Fever on and off for 3 days EXAM: PORTABLE CHEST 1 VIEW COMPARISON:  12/27/2015 FINDINGS: Re- accumulated right pleural effusion which could be moderate volume. No underlying air bronchogram. Mild basilar atelectasis. Normal heart size. No pneumothorax. Pigtail pleural catheter seen over the right upper quadrant. IMPRESSION: 1. Re- accumulated right pleural effusion. 2. Bibasilar atelectasis. Electronically Signed   By: Monte Fantasia M.D.   On: 12/31/2015 10:52   US Thoracentesis Asp Pleural Space W/img Guide  Result Date: 12/31/2015 INDICATION: Fever. Shortness of breath. Recurrent right pleural effusion. Request diagnostic and therapeutic thoracentesis. EXAM: ULTRASOUND GUIDED RIGHT THORACENTESIS MEDICATIONS: None. COMPLICATIONS: None immediate. PROCEDURE: An ultrasound guided thoracentesis was thoroughly discussed with the patient and questions answered. The benefits, risks, alternatives and complications were also discussed.  The patient understands and wishes to proceed with the procedure. Written consent was obtained. Ultrasound was performed to localize and mark an adequate pocket of fluid in the right chest. The area was then prepped and draped in the normal sterile fashion. 1% Lidocaine was used for local anesthesia. Under ultrasound guidance a Safe-T-Centesis catheter was introduced. Thoracentesis was performed. The catheter was removed and a dressing applied. FINDINGS: A total of approximately 1 L of hazy, amber colored fluid was removed. Samples were sent to the laboratory as requested by the clinical team. IMPRESSION: Successful ultrasound guided right thoracentesis yielding 1 L of pleural fluid. Read by: Ascencion Dike PA-C Electronically Signed   By: Markus Daft M.D.   On: 12/31/2015 15:28    Marjie Skiff,  MD 01/01/2016, 9:23 AM PGY-1, [place] Intern pager: [phone], text pages welcome

## 2016-01-01 NOTE — Progress Notes (Addendum)
[facility] Progress Note  Chief Complaint: c difficile diarrhea  Subjective  History:  She feels somewhat better, still with diarrhea and very fatigued.  Appetite poor, does not like clear liquids. Minimal crampy lower abd pain, no BRBPR  ROS: Cardiovascular:  no chest pain Respiratory: mild  Dyspnea at rest.  Improved after thoracentesis yesterday  Objective:  Med list reviewed  Vital signs in last 24 hrs: Vitals:   01/01/16 0327 01/01/16 0821  BP: 122/60 (!) 128/58  Pulse: (!) 102 (!) 103  Resp: (!) 25 (!) 28  Temp: 99 F (37.2 C) 99.8 F (37.7 C)    Physical Exam Tired, non-toxic appearing  HEENT: sclera anicteric, oral mucosa moist without lesions  Neck: supple, no thyromegaly, JVD or lymphadenopathy  Cardiac: RRR without murmurs, S1S2 heard, no peripheral edema  Pulm: clear to auscultation bilaterally, normal RR and effort noted  Abdomen: soft, minimal LLQ and RLQ tenderness, with active bowel sounds. No guarding or palpable hepatosplenomegaly  Skin; warm and dry, no jaundice or rash  Recent Labs:   Recent Labs Lab 12/27/15 1530 12/31/15 0950 12/31/15 1746  WBC 7.5 16.8* 13.4*  HGB 11.4* 11.6* 10.1*  HCT 35.7* 35.4* 31.3*  PLT 330 297 236    Recent Labs Lab 12/31/15 0950 01/01/16 0430  NA 135 135  K 3.3* 2.6*  CL 102 107  CO2 22 22  BUN 12 8  ALBUMIN 2.4*  --   ALKPHOS 196*  --   ALT 29  --   AST 45*  --   GLUCOSE 137* 126*   No results for input(s): INR in the last 168 hours.   @ASSESSMENTPLANBEGIN @ Assessment:  C difficile associated diarrhea Sepsis from CDAD Recent bile leak with patent biliary stent and expected elevated alk Phos   Plan: Treatment plan as outlined by ID service with vancomycin. We were consulted re: source of sepsis and concern for biliary stent patency.  I have no further suggestions at this point, so we will sign off.  Please let me know when she is ready for discharge so we can plan follow up and stent  removal.  I think you can feel free to advance her diet Total time 25 minutes, over half spent in chart review and discussion with patient and providers.  [name] Pager [phone] Mon-Fri 8a-5p [phone] after 5p, weekends, holidays

## 2016-01-02 LAB — CBC
HCT: 29.1 % — ABNORMAL LOW (ref 36.0–46.0)
Hemoglobin: 9.6 g/dL — ABNORMAL LOW (ref 12.0–15.0)
MCH: 28.2 pg (ref 26.0–34.0)
MCHC: 33 g/dL (ref 30.0–36.0)
MCV: 85.6 fL (ref 78.0–100.0)
Platelets: 278 10*3/uL (ref 150–400)
RBC: 3.4 MIL/uL — ABNORMAL LOW (ref 3.87–5.11)
RDW: 15 % (ref 11.5–15.5)
WBC: 6.4 10*3/uL (ref 4.0–10.5)

## 2016-01-02 LAB — BASIC METABOLIC PANEL
Anion gap: 4 — ABNORMAL LOW (ref 5–15)
BUN: <5 mg/dL — ABNORMAL LOW (ref 6–20)
CO2: 19 mmol/L — ABNORMAL LOW (ref 22–32)
Calcium: 7.5 mg/dL — ABNORMAL LOW (ref 8.9–10.3)
Chloride: 113 mmol/L — ABNORMAL HIGH (ref 101–111)
Creatinine, Ser: 0.65 mg/dL (ref 0.44–1.00)
GFR calc Af Amer: >60 mL/min (ref >60)
GFR calc non Af Amer: >60 mL/min (ref >60)
Glucose, Bld: 111 mg/dL — ABNORMAL HIGH (ref 65–99)
Potassium: 3.5 mmol/L (ref 3.5–5.1)
Sodium: 136 mmol/L (ref 135–145)

## 2016-01-02 LAB — GLUCOSE, CAPILLARY: Glucose-Capillary: 92 mg/dL (ref 65–99)

## 2016-01-02 LAB — PATHOLOGIST SMEAR REVIEW: Path Review: NEGATIVE

## 2016-01-02 MED ORDER — PANTOPRAZOLE SODIUM 40 MG PO TBEC
40.0000 mg | DELAYED_RELEASE_TABLET | Freq: Every day | ORAL | Status: DC
  Administered 2016-01-02 – 2016-01-04 (×3): 40 mg via ORAL
  Filled 2016-01-02 (×3): qty 1

## 2016-01-02 MED ORDER — ALUM & MAG HYDROXIDE-SIMETH 200-200-20 MG/5ML PO SUSP
30.0000 mL | ORAL | Status: DC | PRN
  Administered 2016-01-02 – 2016-01-10 (×7): 30 mL via ORAL
  Filled 2016-01-02 (×8): qty 30

## 2016-01-02 MED ORDER — TAB-A-VITE/IRON PO TABS
1.0000 | ORAL_TABLET | Freq: Every day | ORAL | Status: DC
  Filled 2016-01-02: qty 1

## 2016-01-02 MED ORDER — POTASSIUM CHLORIDE CRYS ER 20 MEQ PO TBCR: 40.0000 meq | EXTENDED_RELEASE_TABLET | Freq: Once | ORAL | Status: DC

## 2016-01-02 MED ORDER — SODIUM CHLORIDE 0.9% FLUSH
10.0000 mL | INTRAVENOUS | Status: DC | PRN
  Administered 2016-01-02: 20 mL
  Administered 2016-01-03 – 2016-01-04 (×4): 10 mL
  Administered 2016-01-05: 20 mL
  Administered 2016-01-06 (×2): 30 mL
  Administered 2016-01-09: 10 mL
  Administered 2016-01-10: 30 mL
  Administered 2016-01-11: 10 mL
  Administered 2016-01-11: 30 mL
  Administered 2016-01-14: 10 mL
  Filled 2016-01-02 (×12): qty 40

## 2016-01-02 NOTE — Progress Notes (Signed)
[name] Progress Note    Subjective: Patient is still very weak.  Still having diarrhea  Objective: Vital signs in last 24 hours: Temp:  [97.7 F (36.5 C)-99.3 F (37.4 C)] 98.4 F (36.9 C) (10/25 0741) Pulse Rate:  [90-108] 90 (10/25 0741) Resp:  [17-25] 25 (10/25 0741) BP: (98-135)/(56-74) 130/68 (10/25 0741) SpO2:  [96 %-100 %] 100 % (10/25 0741) Last BM Date: 01/01/16  Intake/Output from previous day: 10/24 0701 - 10/25 0700 In: 3513.3 [P.O.:480; I.V.:2783.3; IV Piggyback:200] Out: 260 [Drains:260] Intake/Output this shift: No intake/output data recorded.  General: No acute distress  Lungs: Clear  Abd: Soft, good bowel sounds.  Not tender  Extremities: No changes  Neuro: Sleepy, but responsive.  Lab Results:  @LABLAST2 (wbc:2,hgb:2,hct:2,plt:2) BMET ) Recent Labs  01/01/16 0430 01/01/16 1507  NA 135 139  K 2.6* 3.0*  CL 107 113*  CO2 22 20*  GLUCOSE 126* 137*  BUN 8 5*  CREATININE 0.79 0.80  CALCIUM 6.9* 7.2*   PT/INR  Recent Labs  01/01/16 1400  LABPROT 16.1*  INR 1.28   ABG No results for input(s): PHART, HCO3 in the last 72 hours.  Invalid input(s): PCO2, PO2  Studies/Results: Dg Chest 1 View  Result Date: 12/31/2015 CLINICAL DATA:  Status post right-sided thoracentesis. EXAM: CHEST 1 VIEW COMPARISON:  12/31/2015 FINDINGS: The cardiac silhouette, mediastinal and hilar contours are within normal limits and stable. Interval evacuation of the right pleural effusion. No residual pleural effusion is identified. Moderate subsegmental right lower lobe atelectasis. No postprocedural pneumothorax. The left lung is stable. IMPRESSION: Status post right-sided thoracentesis with complete evacuation of the right pleural fluid collection. No postprocedural pneumothorax. Electronically Signed   By: Marijo Sanes M.D.   On: 12/31/2015 15:43   Dg Chest Port 1 View  Result Date: 01/01/2016 CLINICAL DATA:  Central line placement EXAM: PORTABLE CHEST 1  VIEW COMPARISON:  12/31/2015 FINDINGS: Interim insertion of right-sided central venous catheter, the tip overlies the proximal right atrium. No right-sided pneumothorax. Bandlike opacities at the right base are slightly decreased. Ill-defined left basilar atelectasis or infiltrates. Stable cardiomediastinal silhouette. Drainage catheters are present in the right upper quadrant. IMPRESSION: 1. Right jugular venous catheter tip overlies the proximal right atrium. No pneumothorax. 2. Slightly improved aeration of the right lung base. Streaky bandlike opacities within both lung bases, atelectasis versus infiltrates, are otherwise unchanged. Electronically Signed   By: Donavan Foil M.D.   On: 01/01/2016 01:25   Dg Chest Port 1 View  Result Date: 12/31/2015 CLINICAL DATA:  Fever on and off for 3 days EXAM: PORTABLE CHEST 1 VIEW COMPARISON:  12/27/2015 FINDINGS: Re- accumulated right pleural effusion which could be moderate volume. No underlying air bronchogram. Mild basilar atelectasis. Normal heart size. No pneumothorax. Pigtail pleural catheter seen over the right upper quadrant. IMPRESSION: 1. Re- accumulated right pleural effusion. 2. Bibasilar atelectasis. Electronically Signed   By: Monte Fantasia M.D.   On: 12/31/2015 10:52   US Thoracentesis Asp Pleural Space W/img Guide  Result Date: 12/31/2015 INDICATION: Fever. Shortness of breath. Recurrent right pleural effusion. Request diagnostic and therapeutic thoracentesis. EXAM: ULTRASOUND GUIDED RIGHT THORACENTESIS MEDICATIONS: None. COMPLICATIONS: None immediate. PROCEDURE: An ultrasound guided thoracentesis was thoroughly discussed with the patient and questions answered. The benefits, risks, alternatives and complications were also discussed. The patient understands and wishes to proceed with the procedure. Written consent was obtained. Ultrasound was performed to localize and mark an adequate pocket of fluid in the right chest. The area was then prepped  and draped in the normal sterile fashion. 1% Lidocaine was used for local anesthesia. Under ultrasound guidance a Safe-T-Centesis catheter was introduced. Thoracentesis was performed. The catheter was removed and a dressing applied. FINDINGS: A total of approximately 1 L of hazy, amber colored fluid was removed. Samples were sent to the laboratory as requested by the clinical team. IMPRESSION: Successful ultrasound guided right thoracentesis yielding 1 L of pleural fluid. Read by: Ascencion Dike PA-C Electronically Signed   By: Markus Daft M.D.   On: 12/31/2015 15:28    Anti-infectives: Anti-infectives    Start     Dose/Rate Route Frequency Ordered Stop   01/02/16 1100  ceFEPIme (MAXIPIME) 2 g in dextrose 5 % 50 mL IVPB     2 g 100 mL/hr over 30 Minutes Intravenous Every 24 hours 01/01/16 1349     01/01/16 1100  ceFEPIme (MAXIPIME) 2 g in dextrose 5 % 50 mL IVPB  Status:  Discontinued     2 g 100 mL/hr over 30 Minutes Intravenous Every 24 hours 12/31/15 1128 01/01/16 1303   12/31/15 2300  vancomycin (VANCOCIN) IVPB 750 mg/150 ml premix  Status:  Discontinued     750 mg 150 mL/hr over 60 Minutes Intravenous Every 12 hours 12/31/15 1128 01/01/16 1345   12/31/15 1800  vancomycin (VANCOCIN) 50 mg/mL oral solution 125 mg     125 mg Oral Every 6 hours 12/31/15 1731     12/31/15 0945  ceFEPIme (MAXIPIME) 2 g in dextrose 5 % 50 mL IVPB     2 g 100 mL/hr over 30 Minutes Intravenous  Once 12/31/15 0932 12/31/15 1050   12/31/15 0945  vancomycin (VANCOCIN) IVPB 1000 mg/200 mL premix  Status:  Discontinued     1,000 mg 200 mL/hr over 60 Minutes Intravenous  Once 12/31/15 0932 12/31/15 0933   12/31/15 0945  vancomycin (VANCOCIN) 1,500 mg in sodium chloride 0.9 % 500 mL IVPB     1,500 mg 250 mL/hr over 120 Minutes Intravenous  Once 12/31/15 0933 12/31/15 1224      Assessment/Plan: s/p  Advance diet Transfer to 6N  Continue antibiotics/Vancomycin   LOS: 2 days   Kathryne Eriksson. Dahlia Bailiff, MD,  FACS [phone] [facility] Surgery 01/02/2016

## 2016-01-02 NOTE — Progress Notes (Signed)
Family Medicine Teaching Service Consult Note Intern Pager: [phone]  Patient name: [name] Medical record number: [id number] Date of birth: 1950/11/28 Age: 65 y.o. Gender: female  Primary Care Provider: Georges Lynch, MD Consultants: Gastroenterology, Surgery Code Status: Full   Pt Overview and Major Events to Date:  9/28: Laproscopic Cholecystectyomy 10/3-10/20: Biliary stent, R LQ drain, thoracentesis  Assessment and Plan: [name] is a 65 y/o F with a past medical history significant HLD, anxiety, depression, CKD stage III, migraines, and CVA s/p laparoscopic cholecysectomy who presented with fever, weakness and diarrhea secondary to C.difficile infection.  #Sepsis, resolving Patient's vitals are all normal. Patient WBCs down to 9.5 from 13.4. Given improvement in clinical picture on current a,d no growth in pleural fluid, patient symptoms appears to be mostly secondary to C.diff infection. Infectious Diseases recommended discontinuing cefepime, was unable to complete that order as primary have administrative authority to make any necessary order changes. Will further discuss need for IV vanc with primary team. Would like to order a BMP to follow up on potassium and would like to follow up WBCs with a morning CBC. --Discontinue IV Vancomycin and Cefepime this am --Follow up on am CBC, BMP --Tylenol 650 mg prn q6 --Zofran 4mg  prn   #Diarrhea, improving Will continue to monitor, patient still symptomatic but improving. --Continue vancomycin 125 mg q6 most likely for 14 days. Will discuss w/ID --Continue Enteric precautions  #Post-thoracotomy pain syndrome --Continue Neurontin 600 mg bid --Oxycodone 5-325 Q4 prn  #HTN, stable  This morning BP 124/61 --Continue to hold home Ramipril 2.5 mg daily --Will restart   #History of CVA in 2015. Stable. With reported residual right sided weakness of right foot and right hand.  Was started on Plavix.  --Hold Plavix for now  given risk of bleeding  #LE swelling. With no formal diagnosis of CHF. Last Echo 12/2015 with EF 55-60%.  At home on 20 mg Lasix daily.  --Hold home Lasix for now, given patient euvolemic on exam and concern for sepsis -Reassess fluid status in AM and consider restarting in AM   #Hypokalemia.  K+ 2.6  --Give K-dur 60 mEq, will continue to replete as needed   --Follow BMET in AM.   #Hyperlipidemia: Last lipid panel: Chol 145, TG 49, HDL 67, LDL 68. --Continue home Lipitor 40 mg daily  #CKD Stage III.  Cr on admission 0.91.  Baseline appears to be 0.7-1.0. --Avoid nephrotoxic agents --Renally dose medications --Monitor BMET in AM  #Migraines --Continue home Topamax 100 mg bid  #Anxiety.  Reports also trouble sleeping.  --Continue home Doxepin 200 mg daily at bedtime  #Depression --Continue home Zoloft 50 mg bid  FEN/GI: regular diet PPx: SCDs  Disposition: Admit to stepdown for further medical management   Subjective:  No acute events overnight. Patient still endorses some diarrhea, but have noticed some improvement in the past 24 hrs. Patient denies, fever or chills. Family members at her side and very attentive to her needs.  Objective: Temp:  [97.7 F (36.5 C)-99.8 F (37.7 C)] 97.7 F (36.5 C) (10/25 0355) Pulse Rate:  [90-108] 90 (10/25 0355) Resp:  [17-28] 17 (10/24 1956) BP: (98-135)/(56-74) 128/74 (10/25 0355) SpO2:  [96 %-100 %] 100 % (10/25 0355)  Physical Exam: General: [name], female lying in hospital bed, in NAD HEENT: EOMI, MMM Cardiovascular: RRR, no MRG, 2+ pulses  Respiratory: CTA B/L no wheezing noted Abdomen: obese, soft, NT ND, biliary drain in place covered with dry bandage,  Extremities: warm, well  perfused, no edema or tenderness, moves all extremities spontaneously Neuro: [name]  Laboratory:  Recent Labs Lab 12/31/15 0950 12/31/15 1746 01/01/16 1400  WBC 16.8* 13.4* 9.5  HGB 11.6* 10.1* 9.1*  HCT 35.4* 31.3* 28.2*  PLT 297 236  247    Recent Labs Lab 12/31/15 0950 12/31/15 1746 01/01/16 0430 01/01/16 1407 01/01/16 1507  NA 135  --  135  --  139  K 3.3*  --  2.6*  --  3.0*  CL 102  --  107  --  113*  CO2 22  --  22  --  20*  BUN 12  --  8  --  5*  CREATININE 1.17* 0.91 0.79  --  0.80  CALCIUM 8.1*  --  6.9*  --  7.2*  PROT 6.3*  --   --  4.4*  --   BILITOT 0.7  --   --   --   --   ALKPHOS 196*  --   --   --   --   ALT 29  --   --   --   --   AST 45*  --   --   --   --   GLUCOSE 137*  --  126*  --  137*    Imaging/Diagnostic Tests: Dg Chest 1 View  Result Date: 12/31/2015 CLINICAL DATA:  Status post right-sided thoracentesis. EXAM: CHEST 1 VIEW COMPARISON:  12/31/2015 FINDINGS: The cardiac silhouette, mediastinal and hilar contours are within normal limits and stable. Interval evacuation of the right pleural effusion. No residual pleural effusion is identified. Moderate subsegmental right lower lobe atelectasis. No postprocedural pneumothorax. The left lung is stable. IMPRESSION: Status post right-sided thoracentesis with complete evacuation of the right pleural fluid collection. No postprocedural pneumothorax. Electronically Signed   By: Marijo Sanes M.D.   On: 12/31/2015 15:43   Dg Chest Port 1 View  Result Date: 01/01/2016 CLINICAL DATA:  Central line placement EXAM: PORTABLE CHEST 1 VIEW COMPARISON:  12/31/2015 FINDINGS: Interim insertion of right-sided central venous catheter, the tip overlies the proximal right atrium. No right-sided pneumothorax. Bandlike opacities at the right base are slightly decreased. Ill-defined left basilar atelectasis or infiltrates. Stable cardiomediastinal silhouette. Drainage catheters are present in the right upper quadrant. IMPRESSION: 1. Right jugular venous catheter tip overlies the proximal right atrium. No pneumothorax. 2. Slightly improved aeration of the right lung base. Streaky bandlike opacities within both lung bases, atelectasis versus infiltrates, are  otherwise unchanged. Electronically Signed   By: Donavan Foil M.D.   On: 01/01/2016 01:25   Dg Chest Port 1 View  Result Date: 12/31/2015 CLINICAL DATA:  Fever on and off for 3 days EXAM: PORTABLE CHEST 1 VIEW COMPARISON:  12/27/2015 FINDINGS: Re- accumulated right pleural effusion which could be moderate volume. No underlying air bronchogram. Mild basilar atelectasis. Normal heart size. No pneumothorax. Pigtail pleural catheter seen over the right upper quadrant. IMPRESSION: 1. Re- accumulated right pleural effusion. 2. Bibasilar atelectasis. Electronically Signed   By: Monte Fantasia M.D.   On: 12/31/2015 10:52   US Thoracentesis Asp Pleural Space W/img Guide  Result Date: 12/31/2015 INDICATION: Fever. Shortness of breath. Recurrent right pleural effusion. Request diagnostic and therapeutic thoracentesis. EXAM: ULTRASOUND GUIDED RIGHT THORACENTESIS MEDICATIONS: None. COMPLICATIONS: None immediate. PROCEDURE: An ultrasound guided thoracentesis was thoroughly discussed with the patient and questions answered. The benefits, risks, alternatives and complications were also discussed. The patient understands and wishes to proceed with the procedure. Written consent was obtained. Ultrasound was  performed to localize and mark an adequate pocket of fluid in the right chest. The area was then prepped and draped in the normal sterile fashion. 1% Lidocaine was used for local anesthesia. Under ultrasound guidance a Safe-T-Centesis catheter was introduced. Thoracentesis was performed. The catheter was removed and a dressing applied. FINDINGS: A total of approximately 1 L of hazy, amber colored fluid was removed. Samples were sent to the laboratory as requested by the clinical team. IMPRESSION: Successful ultrasound guided right thoracentesis yielding 1 L of pleural fluid. Read by: Ascencion Dike PA-C Electronically Signed   By: Markus Daft M.D.   On: 12/31/2015 15:28    Marjie Skiff, MD 01/02/2016, 4:40  AM PGY-1, [place] Intern pager: [phone], text pages welcome

## 2016-01-02 NOTE — Progress Notes (Signed)
[facility] for Infectious Disease    Date of Admission:  12/31/2015   Total days of antibiotics 3                Day 3 po vancomycin           ID: [name] is a 65 y.o. female with [name] is a 65 yo female with PMHx of HTN, HLD, anxiety, depression, and cholelithiasis s/p laparoscopic cholecystectomy with IOC on 12/06/15 who was admitted recently from 10/2 to 10/18 with a large biloma s/p drain placement and right sided pleural effusion s/p thoracentesis who presented on 10/23 with recurrent fevers, shortness of breath, diarrhea and abdominal pain 2/2 C. Difficile Colitis.   Active Problems:   HTN (hypertension)   HLD (hyperlipidemia)   Sepsis (HCC)   Fever   Pleural effusion   Pleural effusion on right   Encounter for central line placement   Diarrhea of presumed infectious origin   LFT elevation   Hypokalemia   Enteritis due to Clostridium difficile   Subjective: Patient was seen and examined this morning. Overall, she feels very weak, but her diarrhea has improved some. She denies fever or chills.   Medications:  . doxepin  200 mg Oral QHS  . gabapentin  600 mg Oral BID  . heparin  5,000 Units Subcutaneous Q8H  . potassium chloride  60 mEq Oral BID  . sertraline  50 mg Oral BID  . topiramate  100 mg Oral BID  . vancomycin  125 mg Oral Q6H    Objective: Vitals:   01/01/16 1956 01/01/16 2304 01/02/16 0355 01/02/16 0741  BP: (!) 129/59 (!) 98/56 128/74 130/68  Pulse: (!) 104 95 90 90  Resp: 17   (!) 25  Temp: 98.4 F (36.9 C) 97.7 F (36.5 C) 97.7 F (36.5 C) 98.4 F (36.9 C)  TempSrc: Oral Oral Oral Oral  SpO2: 97%  100% 100%  Weight:      Height:       General: Vital signs reviewed.  Patient is in no acute distress and cooperative with exam.  Cardiovascular: Regular rate, regular rhythm Pulmonary/Chest: Mild inspiratory crackles in right lung, no wheezes, or rhonchi. Abdominal: Soft, mildly tender to palpation in lower quadrants  bilaterally, non-distended. 2 drains in place with bilious output. Extremities: No lower extremity edema bilaterally, pulses symmetric and intact bilaterally.   Lab Results  Recent Labs  12/31/15 1746 01/01/16 0430 01/01/16 1400 01/01/16 1507  WBC 13.4*  --  9.5  --   HGB 10.1*  --  9.1*  --   HCT 31.3*  --  28.2*  --   NA  --  135  --  139  K  --  2.6*  --  3.0*  CL  --  107  --  113*  CO2  --  22  --  20*  BUN  --  8  --  5*  CREATININE 0.91 0.79  --  0.80   Liver Panel  Recent Labs  12/31/15 0950 01/01/16 1407  PROT 6.3* 4.4*  ALBUMIN 2.4*  --   AST 45*  --   ALT 29  --   ALKPHOS 196*  --   BILITOT 0.7  --    Microbiology: BCx 10/2 >> Strep Viridans UCx 10/2 >> No Growth Body Fluid Cx >> No Growth BCx 10/23 >> NGTD UCx 10/23 >> No Growth Body Fluid Cx >> No Growth C. Diff 10/23 >> positive C.  Diff antigen, C. Diff + by PCR, toxin negative  Studies/Results: Dg Chest 1 View  Result Date: 12/31/2015 CLINICAL DATA:  Status post right-sided thoracentesis. EXAM: CHEST 1 VIEW COMPARISON:  12/31/2015 FINDINGS: The cardiac silhouette, mediastinal and hilar contours are within normal limits and stable. Interval evacuation of the right pleural effusion. No residual pleural effusion is identified. Moderate subsegmental right lower lobe atelectasis. No postprocedural pneumothorax. The left lung is stable. IMPRESSION: Status post right-sided thoracentesis with complete evacuation of the right pleural fluid collection. No postprocedural pneumothorax. Electronically Signed   By: Marijo Sanes M.D.   On: 12/31/2015 15:43   Dg Chest Port 1 View  Result Date: 01/01/2016 CLINICAL DATA:  Central line placement EXAM: PORTABLE CHEST 1 VIEW COMPARISON:  12/31/2015 FINDINGS: Interim insertion of right-sided central venous catheter, the tip overlies the proximal right atrium. No right-sided pneumothorax. Bandlike opacities at the right base are slightly decreased. Ill-defined left  basilar atelectasis or infiltrates. Stable cardiomediastinal silhouette. Drainage catheters are present in the right upper quadrant. IMPRESSION: 1. Right jugular venous catheter tip overlies the proximal right atrium. No pneumothorax. 2. Slightly improved aeration of the right lung base. Streaky bandlike opacities within both lung bases, atelectasis versus infiltrates, are otherwise unchanged. Electronically Signed   By: Donavan Foil M.D.   On: 01/01/2016 01:25   US Thoracentesis Asp Pleural Space W/img Guide  Result Date: 12/31/2015 INDICATION: Fever. Shortness of breath. Recurrent right pleural effusion. Request diagnostic and therapeutic thoracentesis. EXAM: ULTRASOUND GUIDED RIGHT THORACENTESIS MEDICATIONS: None. COMPLICATIONS: None immediate. PROCEDURE: An ultrasound guided thoracentesis was thoroughly discussed with the patient and questions answered. The benefits, risks, alternatives and complications were also discussed. The patient understands and wishes to proceed with the procedure. Written consent was obtained. Ultrasound was performed to localize and mark an adequate pocket of fluid in the right chest. The area was then prepped and draped in the normal sterile fashion. 1% Lidocaine was used for local anesthesia. Under ultrasound guidance a Safe-T-Centesis catheter was introduced. Thoracentesis was performed. The catheter was removed and a dressing applied. FINDINGS: A total of approximately 1 L of hazy, amber colored fluid was removed. Samples were sent to the laboratory as requested by the clinical team. IMPRESSION: Successful ultrasound guided right thoracentesis yielding 1 L of pleural fluid. Read by: Ascencion Dike PA-C Electronically Signed   By: Markus Daft M.D.   On: 12/31/2015 15:28   Assessment/Plan: [name] is a 65 yo female with PMHx of HTN, HLD, anxiety, depression, and cholelithiasis s/p laparoscopic cholecystectomy with IOC on 12/06/15 who was admitted recently from 10/2 to 10/18  with a large biloma s/p drain placement and right sided pleural effusion s/p thoracentesis who presents with recurrent fevers, shortness of breath, diarrhea and abdominal pain 2/2 C. Difficile.   Sepsis 2/2 C. Difficile: Patient remains afebrile with leukocytosis trending down from 16.8 to 13.4 to 9.5 this morning. BCx NGTD. -Continue po vancomycin 125 mg Q6H for a 14 day course (stop date 01/14/16) -BCx 10/23 >> NGTD -UCx 10/23 >> NGTD -Body Fluid Cx 10/23 >> NGTD -IVF for hydration  Right Pleural Effusion s/p Thoracentesis: Body fluid culture no growth. Culture from previous admission was no growth. Vanc and Cefepime discontinued appropriately.   Biloma s/p Drain Placement: Completed 2 weeks of IV Ceftriaxone and Flagyl on 10/18. Drain with 25 cc of bilious output per day.  -Drain management per surgery and gastroenterology  Martyn Malay, DO PGY-3 Internal Medicine Resident Pager # [phone] 01/02/2016 11:57 AM

## 2016-01-03 ENCOUNTER — Encounter: Payer: Self-pay | Admitting: Neurology

## 2016-01-03 LAB — BASIC METABOLIC PANEL
Anion gap: 5 (ref 5–15)
BUN: <5 mg/dL — ABNORMAL LOW (ref 6–20)
CO2: 18 mmol/L — ABNORMAL LOW (ref 22–32)
Calcium: 7.4 mg/dL — ABNORMAL LOW (ref 8.9–10.3)
Chloride: 115 mmol/L — ABNORMAL HIGH (ref 101–111)
Creatinine, Ser: 0.71 mg/dL (ref 0.44–1.00)
GFR calc Af Amer: >60 mL/min (ref >60)
GFR calc non Af Amer: >60 mL/min (ref >60)
Glucose, Bld: 92 mg/dL (ref 65–99)
Potassium: 4.5 mmol/L (ref 3.5–5.1)
Sodium: 138 mmol/L (ref 135–145)

## 2016-01-03 LAB — PREALBUMIN: Prealbumin: 11.1 mg/dL — ABNORMAL LOW (ref 18–38)

## 2016-01-03 MED ORDER — MAGIC MOUTHWASH W/LIDOCAINE
5.0000 mL | Freq: Three times a day (TID) | ORAL | Status: DC
  Administered 2016-01-03: 5 mL via ORAL
  Filled 2016-01-03 (×26): qty 5

## 2016-01-03 MED ORDER — PRO-STAT SUGAR FREE PO LIQD
30.0000 mL | Freq: Two times a day (BID) | ORAL | Status: DC
  Administered 2016-01-03: 30 mL via ORAL
  Filled 2016-01-03 (×6): qty 30

## 2016-01-03 NOTE — Progress Notes (Signed)
Co-pay amount For Vancomycin HCL capsules 125 mg. every 6 hrs. Is $80.00 for 30 day supply., no pa required..  Pharmacies in network are: [facility],   Ref# [id number] for this call.   Ollie

## 2016-01-03 NOTE — Progress Notes (Signed)
Subjective: Still having a lot of diarrhea every couple hours. She says she is too weak to stand or walk. She complains of her esophagus burning ambulates she was on Nexium at home. She also complains of some discomfort with deep inspiration right lower quadrant. Her abdominal distention and pain are resolved. Primary issue now is the diarrhea. Not consuming much from her tray, on regular diet. She does not like the sure or the breeze supplements. She says they're both to sweet.   Objective: Vital signs in last 24 hours: Temp:  [97.9 F (36.6 C)-98.9 F (37.2 C)] 98.9 F (37.2 C) (10/26 0600) Pulse Rate:  [98-108] 98 (10/26 0600) Resp:  [18-20] 18 (10/26 0600) BP: (142-164)/(78-91) 142/78 (10/26 0600) SpO2:  [98 %-100 %] 98 % (10/26 0600) Weight:  [73.1 kg (161 lb 2.5 oz)] 73.1 kg (161 lb 2.5 oz) (10/25 1240) Last BM Date: 01/02/16 240 PO recorded this Am nothing recorded yesterday Voided x 10 Drain 143 Stools x 13 Afebrile, VSS, still tachycardic Labs OK   Intake/Output from previous day: 10/25 0701 - 10/26 0700 In: 2335 [I.V.:2200] Out: 143 [Drains:143] Intake/Output this shift: Total I/O In: 726.7 [P.O.:240; I.V.:486.7] Out: -   General appearance: alert, cooperative and no distress Resp: clear to auscultation bilaterally and BS down some in the bases GI: soft, non-tender; bowel sounds normal; no masses,  no organomegaly and She has depends garments in place for her current diarrhea. IR drains working well. Pain with cough medicine her right upper quadrant just above the drain sites.  Lab Results:   Recent Labs  01/01/16 1400 01/02/16 2153  WBC 9.5 6.4  HGB 9.1* 9.6*  HCT 28.2* 29.1*  PLT 247 278    BMET  Recent Labs  01/02/16 2153 01/03/16 0415  NA 136 138  K 3.5 4.5  CL 113* 115*  CO2 19* 18*  GLUCOSE 111* 92  BUN <5* <5*  CREATININE 0.65 0.71  CALCIUM 7.5* 7.4*   PT/INR  Recent Labs  01/01/16 1400  LABPROT 16.1*  INR 1.28     Recent  Labs Lab 12/31/15 0950 01/01/16 1407  AST 45*  --   ALT 29  --   ALKPHOS 196*  --   BILITOT 0.7  --   PROT 6.3* 4.4*  ALBUMIN 2.4*  --      Lipase     Component Value Date/Time   LIPASE 47 12/16/2015 0241     Studies/Results: No results found.  Medications: . doxepin  200 mg Oral QHS  . gabapentin  600 mg Oral BID  . heparin  5,000 Units Subcutaneous Q8H  . pantoprazole  40 mg Oral Daily  . potassium chloride  40 mEq Oral Once  . potassium chloride  60 mEq Oral BID  . sertraline  50 mg Oral BID  . topiramate  100 mg Oral BID  . vancomycin  125 mg Oral Q6H   . sodium chloride 100 mL/hr at 01/03/16 [id number]    Assessment/Plan Sepsis - with C. difficile colitis S/p laparoscopic cholecystectomy 12/06/15. 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 Wilfrid Lund; IR drain placement 12/17/15 Uncontrolled diarrhea x 24 hours Virdans Streptococcus Bacteremia - Zosyn/Vancomycin 10 days; Rocephin/Flagyl completed 12/24/15 - Dr. Tommy Medal Recurrent right pleural effusion;  Bilateral pleural effusions R>L, s/p right thoracentesis 12/27/15 -m 1.8 L repeat right thoracentesis 12/31/15 1 liter by IR Chronic kidney disease stage III  Creatinine is improved Weakness and debilitation  - get PT and OT  involved Possible oral candidiasis  -  Magic mouth wash Malnutrition - nutrition consult History of TIA on Plavix GERD - PPI Hypertension History of occipital neuralgia Dyslipidemia FEN: IV fluids/regular ID: Cefepime 2 doses d/ced 10/25/Oral vancomycin day 3 DVT:  Heparin   Plan: Continue current treatment for C. difficile colitis., Continuing IR drain, PT and OT evaluations and treatments. Magic mouthwash with lidocaine for her swallowing/oral candidiasis. Nutrition consult to help with improving by mouth intake. Prealbumin is pending.       LOS: 3 days    Shalamar Crays 01/03/2016 [phone]

## 2016-01-03 NOTE — Evaluation (Signed)
Physical Therapy Evaluation Patient Details Name: [name] MRN: [id number] DOB: 11-Dec-1950 Today's Date: 01/03/2016   History of Present Illness  [name] is a 65 y.o. female with [name] is a 65 yo female with PMHx of HTN, HLD, anxiety, depression, and cholelithiasis s/p laparoscopic cholecystectomy with IOC on 12/06/15 who was admitted recently from 10/2 to 10/18 with a large biloma s/p drain placement and right sided pleural effusion s/p thoracentesis who presented on 10/23 with recurrent fevers, shortness of breath, diarrhea and abdominal pain 2/2 C. Difficile Colitis.   Clinical Impression  Pt admitted with above diagnosis. Pt currently with functional limitations due to the deficits listed below (see PT Problem List).  Pt will benefit from skilled PT to increase their independence and safety with mobility to allow discharge to the venue listed below.  Pt did well considering she hasn't walked since Sunday, but has overall generalized weakness from multiple medical issues since Sept.  At this time, feel pt will probably progress well enough to not need any follow up PT at time of d/c, but will continue to assess.     Follow Up Recommendations No PT follow up;Supervision for mobility/OOB    Equipment Recommendations  None recommended by PT    Recommendations for Other Services       Precautions / Restrictions Precautions Precautions: Other (comment) Precaution Comments: drains Restrictions Weight Bearing Restrictions: No      Mobility  Bed Mobility Overal bed mobility: Needs Assistance Bed Mobility: Supine to Sit     Supine to sit: Modified independent (Device/Increase time);HOB elevated     General bed mobility comments: HOB elevated with use of rail  Transfers Overall transfer level: Needs assistance Equipment used: Rolling walker (2 wheeled) Transfers: Sit to/from Stand Sit to Stand: Min guard         General transfer comment: cues to not pull  on her RW for standing  Ambulation/Gait Ambulation/Gait assistance: Min guard Ambulation Distance (Feet): 200 Feet Assistive device: Rolling walker (2 wheeled) Gait Pattern/deviations: Step-through pattern Gait velocity: decreased   General Gait Details: slow guarded gait with step through pattern  Stairs            Wheelchair Mobility    Modified Rankin (Stroke Patients Only)       Balance Overall balance assessment: Needs assistance   Sitting balance-Leahy Scale: Good       Standing balance-Leahy Scale: Fair                               Pertinent Vitals/Pain Pain Assessment: No/denies pain    Home Living Family/patient expects to be discharged to:: Private residence Living Arrangements: Spouse/significant other Available Help at Discharge: Family;Available 24 hours/day Type of Home: House Home Access: Stairs to enter Entrance Stairs-Rails: Right Entrance Stairs-Number of Steps: 2 Home Layout: One level Home Equipment: Walker - 2 wheels;Bedside commode      Prior Function Level of Independence: Independent         Comments: Sits with the elderly     Hand Dominance   Dominant Hand: Right    Extremity/Trunk Assessment   Upper Extremity Assessment: Overall WFL for tasks assessed;Generalized weakness           Lower Extremity Assessment: Generalized weakness;Overall WFL for tasks assessed         Communication   Communication: No difficulties  Cognition Arousal/Alertness: Awake/alert Behavior During Therapy: WFL for tasks assessed/performed  Overall Cognitive Status: Within Functional Limits for tasks assessed                      General Comments      Exercises General Exercises - Lower Extremity Ankle Circles/Pumps: AROM;Both;Seated;10 reps Long Arc Quad: Strengthening;Both;10 reps;Seated Hip ABduction/ADduction: Strengthening;Both;10 reps;Seated Hip Flexion/Marching: Strengthening;Both;10 reps;Seated    Assessment/Plan    PT Assessment Patient needs continued PT services  PT Problem List Decreased activity tolerance;Decreased balance;Decreased mobility;Decreased knowledge of use of DME          PT Treatment Interventions DME instruction;Gait training;Stair training;Functional mobility training;Therapeutic activities;Therapeutic exercise;Patient/family education    PT Goals (Current goals can be found in the Care Plan section)  Acute Rehab PT Goals Patient Stated Goal: get better PT Goal Formulation: With patient Time For Goal Achievement: 01/10/16 Potential to Achieve Goals: Good    Frequency Min 3X/week   Barriers to discharge        Co-evaluation               End of Session Equipment Utilized During Treatment: Gait belt Activity Tolerance: Patient tolerated treatment well Patient left: in chair;with call bell/phone within reach Nurse Communication: Mobility status         Time: [id number] PT Time Calculation (min) (ACUTE ONLY): 25 min   Charges:   PT Evaluation $PT Eval Moderate Complexity: 1 Procedure PT Treatments $Gait Training: 8-22 mins   PT G Codes:        Carmesha Morocco LUBECK 01/03/2016, 12:28 PM

## 2016-01-03 NOTE — Progress Notes (Signed)
Initial Nutrition Assessment  DOCUMENTATION CODES:   Not applicable  INTERVENTION:   -.30 ml Prostat BID, each supplement provides 100 kcals and 15 grams protein  NUTRITION DIAGNOSIS:   Inadequate oral intake related to poor appetite as evidenced by meal completion < 50%.  GOAL:   Patient will meet greater than or equal to 90% of their needs  MONITOR:   PO intake, Supplement acceptance, Labs, Weight trends, Skin, I & O's  REASON FOR ASSESSMENT:   Consult Poor PO  ASSESSMENT:   Patient recently hospitalized from 12/11/15 through 12/28/15 with a biloma, status post cholecystectomy 12/06/15 with intraoperative cholangiogram Dr. Stark Klein. During that hospitalization she underwent ERCP with biliary stent placement on 12/12/15 by Dr. Wilfrid Lund. She underwent right lower quadrant drain placement by IR with a 12 French pigtail drain catheter, also on 12/17/15. She developed a bacteremia growing out. Ends group streptococci and was seen by infectious disease. The last note, from Dr. Tommy Medal on 12/20/15 reported 10 days of effective antibiotics he recommended IV Rocephin and oral Flagyl through 12/24/15 and then discontinuing antibiotics. She also developed a right pleural effusion and underwent thoracentesis on 12/27/15. Thoracentesis that time yielded 1.8 L of pleural fluid.  She showed good improvement result and was discharged home on 12/28/15.  Pt admitted with sepsis of uncertain etiology and c-diff.  Pt underwent thoracentesis due to pleural effusion on 12/31/15. Noted 1 L fluid was removed.   Spoke with pt at bedside, who reports poor appetite. She shares that her appetite has declined over the past 5 weeks, due to recurrent hospitalizations. Pt reports she isn't "a big eater" at baseline and typically consumes 2 meals per day (which include foods such as soup and sandwiches).   Pt reports UBW is around 160# and suspects wt loss due to poor appetite and multiple hospitalizations.  However, wt hx has been stable over the past several months.   Pt shares that she does not like the Boost Breeze or Ensure supplements, due to sweet flavor. She politely declined offer of snacks between meals, however, is amenable to Prostat. Advised pt that she could mix supplement in juice or chase with juice or soft drink to improve flavor. Discussed importance of good PO intake to promote healing.  Nutrition-Focused physical exam completed. Findings are no fat depletion, no muscle depletion, and no edema.   Labs reviewed.  Diet Order:  Diet regular Room service appropriate? Yes; Fluid consistency: Thin  Skin:  Reviewed, no issues  Last BM:  01/03/16  Height:   Ht Readings from Last 1 Encounters:  01/02/16 5\' 7"  (1.702 m)    Weight:   Wt Readings from Last 1 Encounters:  01/02/16 161 lb 2.5 oz (73.1 kg)    Ideal Body Weight:  61.4 kg  BMI:  Body mass index is 25.24 kg/m.  Estimated Nutritional Needs:   Kcal:  1600-1800  Protein:  80-95 grams  Fluid:  1.6-1.8 L  EDUCATION NEEDS:   Education needs addressed  Sankalp Ferrell A. Jimmye Norman, RD, LDN, CDE Pager: [phone] After hours Pager: [phone]

## 2016-01-03 NOTE — Progress Notes (Signed)
Family Medicine Teaching Service Consult Note Intern Pager: [phone]  Patient name: [name] Medical record number: [id number] Date of birth: 1951-03-08 Age: 65 y.o. Gender: female  Primary Care Provider: Georges Lynch, MD Consultants: Gastroenterology, Surgery Code Status: Full   Pt Overview and Major Events to Date:  9/28: Laproscopic Cholecystectyomy 10/3-10/20: Biliary stent, R LQ drain, thoracentesis  Assessment and Plan: [name] is a 65 y/o F with a past medical history significant HLD, anxiety, depression, CKD stage III, migraines, and CVA s/p laparoscopic cholecysectomy who presented with fever, weakness and diarrhea secondary to C.difficile infection.  #Sepsis, resolved --Will continue to monitor vitals.  #Diarrhea, improving Will continue to monitor, patient still symptomatic but improving. --Continue vancomycin 125 mg q6 most likely for 14 days.  --Continue Enteric precautions  #Post-thoracotomy pain syndrome --Continue Neurontin 600 mg bid --Oxycodone 5-325 Q4 prn  #HTN, stable  This morning BP 124/61 --Continue to hold home Ramipril 2.5 mg daily --Will restart   #History of CVA in 2015. Stable. With reported residual right sided weakness of right foot and right hand.  Was started on Plavix.  --Hold Plavix for now given risk of bleeding  #LE swelling. With no formal diagnosis of CHF. Last Echo 12/2015 with EF 55-60%.  At home on 20 mg Lasix daily.  --Hold home Lasix for now, given patient euvolemic on exam and concern for sepsis -Reassess fluid status in AM and consider restarting in AM   #Hypokalemia.  K+ 4.5  -- will continue to replete as needed   --Follow BMET in AM.   #Hyperlipidemia: Last lipid panel: Chol 145, TG 49, HDL 67, LDL 68. --Continue home Lipitor 40 mg daily  #CKD Stage III.  Cr on admission 0.91.  Baseline appears to be 0.7-1.0. --Avoid nephrotoxic agents --Renally dose medications --Monitor BMET in  AM  #Migraines --Continue home Topamax 100 mg bid  #Anxiety.  Reports also trouble sleeping.  --Continue home Doxepin 200 mg daily at bedtime  #Depression --Continue home Zoloft 50 mg bid  FEN/GI: regular diet PPx: SCDs  Disposition: Admit to stepdown for further medical management   Subjective:  No acute events overnight. Patient is doing better this morning still endorses some diarrhea and mild abdominal pain.  Objective: Temp:  [97.9 F (36.6 C)-98.9 F (37.2 C)] 98.9 F (37.2 C) (10/26 0600) Pulse Rate:  [98-108] 98 (10/26 0600) Resp:  [18-20] 18 (10/26 0600) BP: (142-164)/(78-91) 142/78 (10/26 0600) SpO2:  [98 %-100 %] 98 % (10/26 0600) Weight:  [161 lb 2.5 oz (73.1 kg)] 161 lb 2.5 oz (73.1 kg) (10/25 1240)  Physical Exam: General: [name], female lying in hospital bed, in NAD HEENT: EOMI, MMM Cardiovascular: RRR, no MRG, 2+ pulses  Respiratory: CTA B/L no wheezing noted Abdomen: obese, soft, NT ND, biliary drain in place covered with dry bandage,  Extremities: warm, well perfused, no edema or tenderness, moves all extremities spontaneously Neuro: [name]  Laboratory:  Recent Labs Lab 12/31/15 1746 01/01/16 1400 01/02/16 2153  WBC 13.4* 9.5 6.4  HGB 10.1* 9.1* 9.6*  HCT 31.3* 28.2* 29.1*  PLT 236 247 278    Recent Labs Lab 12/31/15 0950  01/01/16 1407 01/01/16 1507 01/02/16 2153 01/03/16 0415  NA 135  < >  --  139 136 138  K 3.3*  < >  --  3.0* 3.5 4.5  CL 102  < >  --  113* 113* 115*  CO2 22  < >  --  20* 19* 18*  BUN 12  < >  --  5* <5* <5*  CREATININE 1.17*  < >  --  0.80 0.65 0.71  CALCIUM 8.1*  < >  --  7.2* 7.5* 7.4*  PROT 6.3*  --  4.4*  --   --   --   BILITOT 0.7  --   --   --   --   --   ALKPHOS 196*  --   --   --   --   --   ALT 29  --   --   --   --   --   AST 45*  --   --   --   --   --   GLUCOSE 137*  < >  --  137* 111* 92  < > = values in this interval not displayed.  Imaging/Diagnostic Tests: Dg Chest 1 View  Result Date:  12/31/2015 CLINICAL DATA:  Status post right-sided thoracentesis. EXAM: CHEST 1 VIEW COMPARISON:  12/31/2015 FINDINGS: The cardiac silhouette, mediastinal and hilar contours are within normal limits and stable. Interval evacuation of the right pleural effusion. No residual pleural effusion is identified. Moderate subsegmental right lower lobe atelectasis. No postprocedural pneumothorax. The left lung is stable. IMPRESSION: Status post right-sided thoracentesis with complete evacuation of the right pleural fluid collection. No postprocedural pneumothorax. Electronically Signed   By: Marijo Sanes M.D.   On: 12/31/2015 15:43   Dg Chest Port 1 View  Result Date: 01/01/2016 CLINICAL DATA:  Central line placement EXAM: PORTABLE CHEST 1 VIEW COMPARISON:  12/31/2015 FINDINGS: Interim insertion of right-sided central venous catheter, the tip overlies the proximal right atrium. No right-sided pneumothorax. Bandlike opacities at the right base are slightly decreased. Ill-defined left basilar atelectasis or infiltrates. Stable cardiomediastinal silhouette. Drainage catheters are present in the right upper quadrant. IMPRESSION: 1. Right jugular venous catheter tip overlies the proximal right atrium. No pneumothorax. 2. Slightly improved aeration of the right lung base. Streaky bandlike opacities within both lung bases, atelectasis versus infiltrates, are otherwise unchanged. Electronically Signed   By: Donavan Foil M.D.   On: 01/01/2016 01:25   Dg Chest Port 1 View  Result Date: 12/31/2015 CLINICAL DATA:  Fever on and off for 3 days EXAM: PORTABLE CHEST 1 VIEW COMPARISON:  12/27/2015 FINDINGS: Re- accumulated right pleural effusion which could be moderate volume. No underlying air bronchogram. Mild basilar atelectasis. Normal heart size. No pneumothorax. Pigtail pleural catheter seen over the right upper quadrant. IMPRESSION: 1. Re- accumulated right pleural effusion. 2. Bibasilar atelectasis. Electronically Signed    By: Monte Fantasia M.D.   On: 12/31/2015 10:52   US Thoracentesis Asp Pleural Space W/img Guide  Result Date: 12/31/2015 INDICATION: Fever. Shortness of breath. Recurrent right pleural effusion. Request diagnostic and therapeutic thoracentesis. EXAM: ULTRASOUND GUIDED RIGHT THORACENTESIS MEDICATIONS: None. COMPLICATIONS: None immediate. PROCEDURE: An ultrasound guided thoracentesis was thoroughly discussed with the patient and questions answered. The benefits, risks, alternatives and complications were also discussed. The patient understands and wishes to proceed with the procedure. Written consent was obtained. Ultrasound was performed to localize and mark an adequate pocket of fluid in the right chest. The area was then prepped and draped in the normal sterile fashion. 1% Lidocaine was used for local anesthesia. Under ultrasound guidance a Safe-T-Centesis catheter was introduced. Thoracentesis was performed. The catheter was removed and a dressing applied. FINDINGS: A total of approximately 1 L of hazy, amber colored fluid was removed. Samples were sent to the laboratory as requested by the clinical team. IMPRESSION: Successful ultrasound guided right  thoracentesis yielding 1 L of pleural fluid. Read by: Ascencion Dike PA-C Electronically Signed   By: Markus Daft M.D.   On: 12/31/2015 15:28    Marjie Skiff, MD 01/03/2016, 12:20 PM PGY-1, [facility] Intern pager: [phone], text pages welcome

## 2016-01-03 NOTE — Progress Notes (Addendum)
[facility] for Infectious Disease    Date of Admission:  12/31/2015   Total days of antibiotics 4                Day 4 po vancomycin           ID: ELEXSIS CAYLOR is a 65 y.o. female with Ms. Radell is a 65 yo female with PMHx of HTN, HLD, anxiety, depression, and cholelithiasis s/p laparoscopic cholecystectomy with IOC on 12/06/15 who was admitted recently from 10/2 to 10/18 with a large biloma s/p drain placement and right sided pleural effusion s/p thoracentesis who presented on 10/23 with recurrent fevers, shortness of breath, diarrhea and abdominal pain 2/2 C. Difficile Colitis.   Active Problems:   HTN (hypertension)   HLD (hyperlipidemia)   Sepsis (HCC)   Fever   Pleural effusion   Pleural effusion on right   Encounter for central line placement   Diarrhea of presumed infectious origin   LFT elevation   Hypokalemia   Enteritis due to Clostridium difficile   Subjective: Patient was seen and examined this morning. Main complaint continues to be weakness and persistent uncontrollable diarrhea, about 10 bowel movements per day. She denies fever, chills, or vomiting.  Medications:  . doxepin  200 mg Oral QHS  . gabapentin  600 mg Oral BID  . heparin  5,000 Units Subcutaneous Q8H  . magic mouthwash w/lidocaine  5 mL Oral TID AC & HS  . pantoprazole  40 mg Oral Daily  . potassium chloride  40 mEq Oral Once  . potassium chloride  60 mEq Oral BID  . sertraline  50 mg Oral BID  . topiramate  100 mg Oral BID  . vancomycin  125 mg Oral Q6H    Objective: Vitals:   01/02/16 1826 01/02/16 2052 01/02/16 2055 01/03/16 0600  BP: (!) 155/87 (!) 164/91 (!) 158/88 (!) 142/78  Pulse: (!) 104 (!) 108  98  Resp: 20 20  18   Temp: 97.9 F (36.6 C) 98.2 F (36.8 C)  98.9 F (37.2 C)  TempSrc: Oral Oral  Oral  SpO2: 99% 99%  98%  Weight:      Height:       General: Vital signs reviewed.  Patient is in no acute distress and cooperative with exam.  Cardiovascular: Regular  rate, regular rhythm Pulmonary/Chest: Clear to auscultation, no wheezes, or rhonchi. Abdominal: Soft, non-tender, non-distended.  Extremities: No lower extremity edema bilaterally, pulses symmetric and intact bilaterally.   Lab Results  Recent Labs  01/01/16 1400  01/02/16 2153 01/03/16 0415  WBC 9.5  --  6.4  --   HGB 9.1*  --  9.6*  --   HCT 28.2*  --  29.1*  --   NA  --   < > 136 138  K  --   < > 3.5 4.5  CL  --   < > 113* 115*  CO2  --   < > 19* 18*  BUN  --   < > <5* <5*  CREATININE  --   < > 0.65 0.71  < > = values in this interval not displayed. Liver Panel  Recent Labs  01/01/16 1407  PROT 4.4*   Microbiology: BCx 10/2 >> Strep Viridans UCx 10/2 >> No Growth Body Fluid Cx >> No Growth BCx 10/23 >> NGTD UCx 10/23 >> No Growth Body Fluid Cx >> No Growth C. Diff 10/23 >> positive C. Diff antigen, C. Diff + by  PCR, toxin negative  Assessment/Plan: Ms. Goslin is a 65 yo female with PMHx of HTN, HLD, anxiety, depression, and cholelithiasis s/p laparoscopic cholecystectomy with IOC on 12/06/15 who was admitted recently from 10/2 to 10/18 with a large biloma s/p drain placement and right sided pleural effusion s/p thoracentesis who presents with recurrent fevers, shortness of breath, diarrhea and abdominal pain 2/2 C. Difficile.   Sepsis 2/2 C. Difficile Colitis: Patient remains afebrile. WBC 6.4. BCx NGTD. Patient is slowly improving with po vancomycin, but remains very weak with persistent diarrhea. -Continue po vancomycin 125 mg Q6H for a 14 day course (stop date 01/14/16) -BCx 10/23 >> NGTD -UCx 10/23 >> NGTD -Body Fluid Cx 10/23 >> NGTD -IVF for hydration -Consider PT/OT for mobilization and weakness  Right Pleural Effusion s/p Thoracentesis: Body fluid culture no growth. Culture from previous admission was no growth.  Biloma s/p Drain Placement: Completed 2 weeks of IV Ceftriaxone and Flagyl on 10/18.  -Drain management per surgery and gastroenterology  ID  will sign off. Please continue with po vancomycin to complete a 14 day course on 01/14/16.   Martyn Malay, DO PGY-3 Internal Medicine Resident Pager # [phone] 01/03/2016 11:21 AM

## 2016-01-04 DIAGNOSIS — R262 Difficulty in walking, not elsewhere classified: Secondary | ICD-10-CM

## 2016-01-04 MED ORDER — SACCHAROMYCES BOULARDII 250 MG PO CAPS
250.0000 mg | ORAL_CAPSULE | Freq: Two times a day (BID) | ORAL | Status: DC
  Administered 2016-01-04 – 2016-01-14 (×21): 250 mg via ORAL
  Filled 2016-01-04 (×21): qty 1

## 2016-01-04 MED ORDER — FAMOTIDINE 20 MG PO TABS
20.0000 mg | ORAL_TABLET | Freq: Two times a day (BID) | ORAL | Status: DC
  Administered 2016-01-04 – 2016-01-14 (×20): 20 mg via ORAL
  Filled 2016-01-04 (×20): qty 1

## 2016-01-04 NOTE — Progress Notes (Signed)
  Subjective: She feels much better and is able to walk with assist.  She is still having multiple stools but the number has markedly decreased.  She is able to eat not with changes yesterday.  Drains working well.  Most of her pain now is from the drain site.    Objective: Vital signs in last 24 hours: Temp:  [98.1 F (36.7 C)-100.2 F (37.9 C)] 98.6 F (37 C) (10/27 0456) Pulse Rate:  [100-106] 106 (10/27 0456) Resp:  [18-20] 18 (10/27 0456) BP: (126-137)/(78-84) 130/78 (10/27 0456) SpO2:  [98 %-100 %] 98 % (10/27 0456) Last BM Date: 01/03/16 360 PO recorded Drain 615 Down to 3 BM's recorded yesterday TM 100.2 1900 last PM; still tachycardic No labs this AM, prealbumin 11.1 yesterday Intake/Output from previous day: 10/26 0701 - 10/27 0700 In: 2470 [P.O.:360; I.V.:2100] Out: 615 [Drains:615] Intake/Output this shift: Total I/O In: 700 [I.V.:700] Out: -   General appearance: alert, cooperative and no distress Resp: BS down in the base.   GI: soft, the drains working well, pain is currently just at the drain sites.    Lab Results:   Recent Labs  01/01/16 1400 01/02/16 2153  WBC 9.5 6.4  HGB 9.1* 9.6*  HCT 28.2* 29.1*  PLT 247 278    BMET  Recent Labs  01/02/16 2153 01/03/16 0415  NA 136 138  K 3.5 4.5  CL 113* 115*  CO2 19* 18*  GLUCOSE 111* 92  BUN <5* <5*  CREATININE 0.65 0.71  CALCIUM 7.5* 7.4*   PT/INR  Recent Labs  01/01/16 1400  LABPROT 16.1*  INR 1.28     Recent Labs Lab 12/31/15 0950 01/01/16 1407  AST 45*  --   ALT 29  --   ALKPHOS 196*  --   BILITOT 0.7  --   PROT 6.3* 4.4*  ALBUMIN 2.4*  --      Lipase     Component Value Date/Time   LIPASE 47 12/16/2015 0241     Studies/Results: No results found.  Medications: . doxepin  200 mg Oral QHS  . famotidine  20 mg Oral BID  . feeding supplement (PRO-STAT SUGAR FREE 64)  30 mL Oral BID  . gabapentin  600 mg Oral BID  . heparin  5,000 Units Subcutaneous Q8H  . magic  mouthwash w/lidocaine  5 mL Oral TID AC & HS  . sertraline  50 mg Oral BID  . topiramate  100 mg Oral BID  . vancomycin  125 mg Oral Q6H    Assessment/Plan Sepsis - with C. difficile colitis S/p laparoscopic cholecystectomy 12/06/15. 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 Wilfrid Lund; IR drain placement 12/17/15 Uncontrolled diarrhea x 24 hours VirdansStreptococcus Bacteremia - Zosyn/Vancomycin 10 days; Rocephin/Flagyl completed 12/24/15 - Dr. Tommy Medal Recurrent right pleural effusion; Bilateral pleural effusions R>L, s/p right thoracentesis 12/27/15 -m 1.8 L repeat right thoracentesis 12/31/15 1 liter by IR Chronic kidney disease stage III Creatinine is improved Weakness and debilitation  - get PT and OT involved Possible oral candidiasis  -  Magic mouth wash Malnutrition - nutrition consult History of TIA on Plavix GERD - PPI Hypertension History of occipital neuralgia Dyslipidemia FEN: IV fluids/regular diet ID: Cefepime 2 doses d/ced 10/25/Oral vancomycin day 5 DVT:  Heparin    Plan:  Continue the current Rx and hopefully home Monday.    LOS: 4 days    Dmauri Rosenow 01/04/2016 [phone]

## 2016-01-04 NOTE — Evaluation (Signed)
Occupational Therapy Evaluation Patient Details Name: [name] MRN: [id number] DOB: 1950-12-12 Today's Date: 01/04/2016    History of Present Illness [name] is a 65 yo female with PMHx of HTN, HLD, anxiety, depression, and cholelithiasis s/p laparoscopic cholecystectomy with IOC on 12/06/15 who was admitted recently from 10/2 to 10/18 with a large biloma s/p drain placement and right sided pleural effusion s/p thoracentesis who presented on 10/23 with recurrent fevers, shortness of breath, diarrhea and abdominal pain 2/2 C. Difficile Colitis.    Clinical Impression   Pt with significant decline in function prior to current admission. Prior to admission for cholecystectomy 12/06/15, pt was independent with ADL, IADL, and functional mobility and active in the community. Prior to current admission, pt required RW for functional mobility and assistance with ADL secondary to fatigue. Currently, pt requires supervision for all ADL and functional mobility and presents with decreased activity tolerance for all ADL tasks. Additionally, pt demonstrates BUE weakness, most noticeable in tricep extension. Educated pt on chair push-ups for HEP at this time and will continue to update. Pt would benefit from continued OT services while admitted to improve functional independence and activity tolerance to maintain safety. Recommend D/C home with no OT follow-up and intermittent assistance from husband. OT will continue to follow acutely with focus on ADL, activity tolerance, and BUE strengthening.    Follow Up Recommendations  No OT follow up;Supervision - Intermittent    Equipment Recommendations  Other (comment);Tub/shower seat (May progress to be able to shower without shower seat.)       Precautions / Restrictions Precautions Precautions: Other (comment) Precaution Comments: drains Restrictions Weight Bearing Restrictions: No      Mobility Bed Mobility               General bed  mobility comments: Out of bed in chair on OT arrival.  Transfers Overall transfer level: Needs assistance Equipment used: Rolling walker (2 wheeled) Transfers: Sit to/from Stand Sit to Stand: Supervision         General transfer comment: VC's for hand placement    Balance Overall balance assessment: Needs assistance Sitting-balance support: Feet supported;No upper extremity supported Sitting balance-Leahy Scale: Good     Standing balance support: During functional activity;Single extremity supported;No upper extremity supported Standing balance-Leahy Scale: Fair Standing balance comment: Able to remove B UEs briefly from RW for grooming task at sink but resting single UE on sink throughout majority of task. Use of RW for functional mobility.                            ADL Overall ADL's : Needs assistance/impaired     Grooming: Supervision/safety;Standing;Wash/dry face   Upper Body Bathing: Supervision/ safety;Sitting   Lower Body Bathing: Supervison/ safety;Sit to/from stand   Upper Body Dressing : Supervision/safety;Sitting   Lower Body Dressing: Supervision/safety;Sit to/from stand   Toilet Transfer: Supervision/safety;Ambulation;BSC;RW   Toileting- Clothing Manipulation and Hygiene: Supervision/safety;Sit to/from stand   Tub/ Banker: Ambulation;3 in 1;Min guard   Functional mobility during ADLs: Supervision/safety;Rolling walker General ADL Comments: Educated pt and husband on energy conservation strategies and fall prevention. Educated pt on chair push-ups to improve functional BUE strength for RW use.     Vision Vision Assessment?: No apparent visual deficits          Pertinent Vitals/Pain Pain Assessment: 0-10 Pain Score: 3  Pain Location: Abdomen Pain Descriptors / Indicators: Aching;Sore Pain Intervention(s): Limited activity within patient's tolerance;Repositioned;Monitored  during session     Hand Dominance Right    Extremity/Trunk Assessment Upper Extremity Assessment Upper Extremity Assessment: Generalized weakness   Lower Extremity Assessment Lower Extremity Assessment: Generalized weakness       Communication Communication Communication: No difficulties   Cognition Arousal/Alertness: Awake/alert Behavior During Therapy: WFL for tasks assessed/performed Overall Cognitive Status: Within Functional Limits for tasks assessed                        Exercises Exercises: General Upper Extremity          Home Living Family/patient expects to be discharged to:: Private residence Living Arrangements: Spouse/significant other Available Help at Discharge: Family;Available 24 hours/day Type of Home: House Home Access: Stairs to enter [employer] of Steps: 2 Entrance Stairs-Rails: Right Home Layout: One level     Bathroom Shower/Tub: Tub/shower unit Shower/tub characteristics: Curtain Biochemist, clinical: Handicapped height     Home Equipment: Environmental consultant - 2 wheels;Bedside commode          Prior Functioning/Environment Level of Independence: Independent        Comments: Sits with the elderly        OT Problem List: Decreased strength;Decreased activity tolerance;Impaired balance (sitting and/or standing);Pain   OT Treatment/Interventions: Self-care/ADL training;Therapeutic exercise;Energy conservation;Therapeutic activities;Balance training;Patient/family education    OT Goals(Current goals can be found in the care plan section) Acute Rehab OT Goals Patient Stated Goal: get better OT Goal Formulation: With patient Time For Goal Achievement: 01/18/16 Potential to Achieve Goals: Good ADL Goals Pt Will Transfer to Toilet: with modified independence;ambulating;regular height toilet Pt Will Perform Toileting - Clothing Manipulation and hygiene: with modified independence;sit to/from stand Pt Will Perform Tub/Shower Transfer: with modified  independence;ambulating;shower seat;Tub transfer Pt/caregiver will Perform Home Exercise Program: Increased strength;Both right and left upper extremity;With written HEP provided;Independently;With theraband Additional ADL Goal #1: Pt will independently implement 3 energy conservation strategies into ADL session.  OT Frequency: Min 2X/week    End of Session Equipment Utilized During Treatment: Gait belt;Rolling walker  Activity Tolerance: Patient tolerated treatment well Patient left: in chair;with call bell/phone within reach   Time: 1356-1419 OT Time Calculation (min): 23 min Charges:  OT General Charges $OT Visit: 1 Procedure OT Evaluation $OT Eval Moderate Complexity: 1 Procedure OT Treatments $Self Care/Home Management : 8-22 mins   Norman Herrlich, OTR/L [phone] 01/04/2016, 2:39 PM

## 2016-01-04 NOTE — Progress Notes (Signed)
Family Medicine Teaching Service Consult Note Intern Pager: [phone]  Patient name: [name] Medical record number: [id number] Date of birth: Sep 15, 1950 Age: 65 y.o. Gender: female  Primary Care Provider: Georges Lynch, MD Consultants: Gastroenterology, Surgery Code Status: Full   Pt Overview and Major Events to Date:  9/28: Laproscopic Cholecystectyomy 10/3-10/20: Biliary stent, R LQ drain, thoracentesis  Assessment and Plan: [name] is a 65 y/o F with a past medical history significant HLD, anxiety, depression, CKD stage III, migraines, and CVA s/p laparoscopic cholecysectomy who presented with fever, weakness and diarrhea secondary to C.difficile infection.  #Sepsis, resolved --Will continue to monitor vitals.  #Diarrhea, improving Will continue to monitor, patient still symptomatic but improving. --Continue vancomycin 125 mg q6 most likely for 14 days.  --Continue Enteric precautions  #Post-thoracotomy pain syndrome --Continue Neurontin 600 mg bid --Oxycodone 5-325 Q4 prn  #HTN, stable  This morning BP 130/78 --Continue to hold home Ramipril 2.5 mg daily --Will restart   #History of CVA in 2015. Stable. With reported residual right sided weakness of right foot and right hand.  Was started on Plavix.  --Hold Plavix for now given risk of bleeding  #LE swelling. With no formal diagnosis of CHF. Last Echo 12/2015 with EF 55-60%.  At home on 20 mg Lasix daily.  No LE edema this morning. Will hold off on lasix.  --Hold home Lasix for now, given patient euvolemic on exam and concern for sepsis -Reassess fluid status in AM and consider restarting in AM   #Hypokalemia.  K+ 4.5  -- will continue to replete as needed   --Follow BMET in AM.   #Hyperlipidemia: Last lipid panel: Chol 145, TG 49, HDL 67, LDL 68. --Continue home Lipitor 40 mg daily  #CKD Stage III.  Cr on admission 0.91.  Baseline appears to be 0.7-1.0. --Avoid nephrotoxic agents --Renally  dose medications  #Migraines --Continue home Topamax 100 mg bid  #Anxiety.  Reports also trouble sleeping.  --Continue home Doxepin 200 mg daily at bedtime  #Depression --Continue home Zoloft 50 mg bid  FEN/GI: regular diet PPx: SCDs  Disposition: Admit to stepdown for further medical management   Subjective:  No acute events overnight. Patient continues to have diarrhea and feels week.  Did get a chance to get up and move around with PT and OT today.   Objective: Temp:  [98.1 F (36.7 C)-100.2 F (37.9 C)] 98.1 F (36.7 C) (10/27 1422) Pulse Rate:  [102-115] 115 (10/27 1422) Resp:  [18] 18 (10/27 1422) BP: (130-140)/(78-83) 140/83 (10/27 1422) SpO2:  [98 %-100 %] 100 % (10/27 1422)  Physical Exam: General: [name], female lying in hospital bed, in NAD HEENT: EOMI, MMM Cardiovascular: RRR, no MRG, 2+ pulses  Respiratory: CTA B/L no wheezing noted Abdomen: obese, soft, NT ND, biliary drain in place covered with dry bandage,  Extremities: warm, well perfused, no edema or tenderness, moves all extremities spontaneously Neuro: [name]  Laboratory:  Recent Labs Lab 12/31/15 1746 01/01/16 1400 01/02/16 2153  WBC 13.4* 9.5 6.4  HGB 10.1* 9.1* 9.6*  HCT 31.3* 28.2* 29.1*  PLT 236 247 278    Recent Labs Lab 12/31/15 0950  01/01/16 1407 01/01/16 1507 01/02/16 2153 01/03/16 0415  NA 135  < >  --  139 136 138  K 3.3*  < >  --  3.0* 3.5 4.5  CL 102  < >  --  113* 113* 115*  CO2 22  < >  --  20* 19* 18*  BUN  12  < >  --  5* <5* <5*  CREATININE 1.17*  < >  --  0.80 0.65 0.71  CALCIUM 8.1*  < >  --  7.2* 7.5* 7.4*  PROT 6.3*  --  4.4*  --   --   --   BILITOT 0.7  --   --   --   --   --   ALKPHOS 196*  --   --   --   --   --   ALT 29  --   --   --   --   --   AST 45*  --   --   --   --   --   GLUCOSE 137*  < >  --  137* 111* 92  < > = values in this interval not displayed.  Imaging/Diagnostic Tests: Dg Chest Port 1 View  Result Date: 01/01/2016 CLINICAL  DATA:  Central line placement EXAM: PORTABLE CHEST 1 VIEW COMPARISON:  12/31/2015 FINDINGS: Interim insertion of right-sided central venous catheter, the tip overlies the proximal right atrium. No right-sided pneumothorax. Bandlike opacities at the right base are slightly decreased. Ill-defined left basilar atelectasis or infiltrates. Stable cardiomediastinal silhouette. Drainage catheters are present in the right upper quadrant. IMPRESSION: 1. Right jugular venous catheter tip overlies the proximal right atrium. No pneumothorax. 2. Slightly improved aeration of the right lung base. Streaky bandlike opacities within both lung bases, atelectasis versus infiltrates, are otherwise unchanged. Electronically Signed   By: Donavan Foil M.D.   On: 01/01/2016 01:25    Eloise Levels, MD 01/04/2016, 3:43 PM PGY-1, [place] Intern pager: [phone], text pages welcome

## 2016-01-04 NOTE — Progress Notes (Signed)
Physical Therapy Treatment Patient Details Name: [name] MRN: [id number] DOB: 1950-09-02 Today's Date: 01/04/2016    History of Present Illness [name] is a 65 y.o. female with [name] is a 65 yo female with PMHx of HTN, HLD, anxiety, depression, and cholelithiasis s/p laparoscopic cholecystectomy with IOC on 12/06/15 who was admitted recently from 10/2 to 10/18 with a large biloma s/p drain placement and right sided pleural effusion s/p thoracentesis who presented on 10/23 with recurrent fevers, shortness of breath, diarrhea and abdominal pain 2/2 C. Difficile Colitis.     PT Comments    Pt making good progress with mobility.  Pt has been cleared to ambulate with family members and RW.  Will check back with pt at beginning of next week to progress mobility with stairs, HEP and gait without AD if able to tolerate.   Follow Up Recommendations  No PT follow up;Supervision - Intermittent     Equipment Recommendations  None recommended by PT    Recommendations for Other Services       Precautions / Restrictions Precautions Precautions: Other (comment) Precaution Comments: drains Restrictions Weight Bearing Restrictions: No    Mobility  Bed Mobility Overal bed mobility: Modified Independent Bed Mobility: Rolling;Sidelying to Sit Rolling: Modified independent (Device/Increase time) Sidelying to sit: Modified independent (Device/Increase time)       General bed mobility comments: Pt able to perform with bed flat without rail  Transfers Overall transfer level: Needs assistance Equipment used: Rolling walker (2 wheeled) Transfers: Sit to/from Stand Sit to Stand: Supervision         General transfer comment: better use of hands today  Ambulation/Gait Ambulation/Gait assistance: Supervision Ambulation Distance (Feet): 270 Feet Assistive device: Rolling walker (2 wheeled) Gait Pattern/deviations: Step-through pattern     General Gait Details:  Improved gait speed with step through pattern   Stairs            Wheelchair Mobility    Modified Rankin (Stroke Patients Only)       Balance     Sitting balance-Leahy Scale: Good       Standing balance-Leahy Scale: Fair                      Cognition Arousal/Alertness: Awake/alert Behavior During Therapy: WFL for tasks assessed/performed Overall Cognitive Status: Within Functional Limits for tasks assessed                      Exercises General Exercises - Lower Extremity Hip Flexion/Marching: Strengthening;10 reps;Standing Heel Raises: Strengthening;Both;10 reps;Standing Mini-Sqauts: Strengthening;10 reps;Standing    General Comments        Pertinent Vitals/Pain Pain Assessment: No/denies pain    Home Living                      Prior Function            PT Goals (current goals can now be found in the care plan section) Acute Rehab PT Goals Patient Stated Goal: get better PT Goal Formulation: With patient Time For Goal Achievement: 01/10/16 Potential to Achieve Goals: Good Progress towards PT goals: Progressing toward goals;Goals met and updated - see care plan    Frequency    Min 3X/week      PT Plan Current plan remains appropriate    Co-evaluation             End of Session Equipment Utilized During Treatment: Gait belt Activity Tolerance:  Patient tolerated treatment well Patient left: in chair;with call bell/phone within reach     Time: 0913-0928 PT Time Calculation (min) (ACUTE ONLY): 15 min  Charges:  $Gait Training: 8-22 mins                    G Codes:      Ondra Deboard LUBECK 01/04/2016, 10:50 AM

## 2016-01-05 DIAGNOSIS — R509 Fever, unspecified: Secondary | ICD-10-CM

## 2016-01-05 LAB — CULTURE, BLOOD (ROUTINE X 2)
Culture: NO GROWTH
Culture: NO GROWTH

## 2016-01-05 LAB — BASIC METABOLIC PANEL
Anion gap: 5 (ref 5–15)
BUN: <5 mg/dL — ABNORMAL LOW (ref 6–20)
CO2: 21 mmol/L — ABNORMAL LOW (ref 22–32)
Calcium: 7.7 mg/dL — ABNORMAL LOW (ref 8.9–10.3)
Chloride: 114 mmol/L — ABNORMAL HIGH (ref 101–111)
Creatinine, Ser: 0.63 mg/dL (ref 0.44–1.00)
GFR calc Af Amer: >60 mL/min (ref >60)
GFR calc non Af Amer: >60 mL/min (ref >60)
Glucose, Bld: 101 mg/dL — ABNORMAL HIGH (ref 65–99)
Potassium: 3.6 mmol/L (ref 3.5–5.1)
Sodium: 140 mmol/L (ref 135–145)

## 2016-01-05 LAB — CULTURE, BODY FLUID-BOTTLE: Culture: NO GROWTH

## 2016-01-05 LAB — CBC
HCT: 28.4 % — ABNORMAL LOW (ref 36.0–46.0)
Hemoglobin: 9.1 g/dL — ABNORMAL LOW (ref 12.0–15.0)
MCH: 27.9 pg (ref 26.0–34.0)
MCHC: 32 g/dL (ref 30.0–36.0)
MCV: 87.1 fL (ref 78.0–100.0)
Platelets: 337 10*3/uL (ref 150–400)
RBC: 3.26 MIL/uL — ABNORMAL LOW (ref 3.87–5.11)
RDW: 15.4 % (ref 11.5–15.5)
WBC: 8.8 10*3/uL (ref 4.0–10.5)

## 2016-01-05 MED ORDER — LORATADINE 10 MG PO TABS
10.0000 mg | ORAL_TABLET | Freq: Every day | ORAL | Status: DC
  Administered 2016-01-05 – 2016-01-14 (×10): 10 mg via ORAL
  Filled 2016-01-05 (×10): qty 1

## 2016-01-05 NOTE — Progress Notes (Signed)
Subjective: Slowly feeling better but still feels weak Requires assistance to and related to bathroom Up in hall with walker and assistance Concerned about falling at home Working with physical therapy  Alert and stable. 3-4 stools last 24 hours, slowing down Denies abdominal pain 300 mL recorded from drains last 24 hours.  This may be an incomplete record  Infectious disease has signed off.   Objective: Vital signs in last 24 hours: Temp:  [98.1 F (36.7 C)-99 F (37.2 C)] 98.6 F (37 C) (10/28 0546) Pulse Rate:  [104-115] 104 (10/28 0546) Resp:  [17-18] 17 (10/28 0546) BP: (134-140)/(78-84) 139/84 (10/28 0546) SpO2:  [97 %-100 %] 98 % (10/28 0546) Weight:  [69.4 kg (153 lb)] 69.4 kg (153 lb) (10/27 2029) Last BM Date: 01/04/16  Intake/Output from previous day: 10/27 0701 - 10/28 0700 In: 2986.7 [P.O.:360; I.V.:2626.7] Out: -  Intake/Output this shift: Total I/O In: 240 [P.O.:240] Out: 300 [Drains:300]  General appearance: Alert.  No distress.  Deconditioned. Resp: clear to auscultation bilaterally GI: Soft.  Nontender.  Nondistended.  Subhepatic drain bilious and a little cloudy.  Subcapsular liver drain is clear bile.  Lab Results:   Recent Labs  01/02/16 2153 01/05/16 0500  WBC 6.4 8.8  HGB 9.6* 9.1*  HCT 29.1* 28.4*  PLT 278 337   BMET  Recent Labs  01/03/16 0415 01/05/16 0500  NA 138 140  K 4.5 3.6  CL 115* 114*  CO2 18* 21*  GLUCOSE 92 101*  BUN <5* <5*  CREATININE 0.71 0.63  CALCIUM 7.4* 7.7*   PT/INR No results for input(s): LABPROT, INR in the last 72 hours. ABG No results for input(s): PHART, HCO3 in the last 72 hours.  Invalid input(s): PCO2, PO2  Studies/Results: No results found.  Anti-infectives: Anti-infectives    Start     Dose/Rate Route Frequency Ordered Stop   01/02/16 1100  ceFEPIme (MAXIPIME) 2 g in dextrose 5 % 50 mL IVPB  Status:  Discontinued     2 g 100 mL/hr over 30 Minutes Intravenous Every 24 hours  01/01/16 1349 01/02/16 1011   01/01/16 1100  ceFEPIme (MAXIPIME) 2 g in dextrose 5 % 50 mL IVPB  Status:  Discontinued     2 g 100 mL/hr over 30 Minutes Intravenous Every 24 hours 12/31/15 1128 01/01/16 1303   12/31/15 2300  vancomycin (VANCOCIN) IVPB 750 mg/150 ml premix  Status:  Discontinued     750 mg 150 mL/hr over 60 Minutes Intravenous Every 12 hours 12/31/15 1128 01/01/16 1345   12/31/15 1800  vancomycin (VANCOCIN) 50 mg/mL oral solution 125 mg     125 mg Oral Every 6 hours 12/31/15 1731     12/31/15 0945  ceFEPIme (MAXIPIME) 2 g in dextrose 5 % 50 mL IVPB     2 g 100 mL/hr over 30 Minutes Intravenous  Once 12/31/15 0932 12/31/15 1050   12/31/15 0945  vancomycin (VANCOCIN) IVPB 1000 mg/200 mL premix  Status:  Discontinued     1,000 mg 200 mL/hr over 60 Minutes Intravenous  Once 12/31/15 0932 12/31/15 0933   12/31/15 0945  vancomycin (VANCOCIN) 1,500 mg in sodium chloride 0.9 % 500 mL IVPB     1,500 mg 250 mL/hr over 120 Minutes Intravenous  Once 12/31/15 0933 12/31/15 1224      Assessment/Plan:  Sepsis - with C. difficile colitis S/p laparoscopic cholecystectomy 12/06/15. 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 Wilfrid Lund; IR drain placement 12/17/15 Uncontrolled diarrhea x  24 hours VirdansStreptococcus Bacteremia - Zosyn/Vancomycin 10 days; Rocephin/Flagyl completed 12/24/15 - Dr. Tommy Medal Recurrent right pleural effusion; Bilateral pleural effusions R>L, s/p right thoracentesis 12/27/15 -m 1.8 L repeat right thoracentesis 12/31/15 1 liter by IR Chronic kidney disease stage III Creatinine is improved Weakness and debilitation - get PT and OT involved Possible oral candidiasis - Magic mouth wash Malnutrition - nutrition consult History of TIA on Plavix GERD - PPI Hypertension History of occipital neuralgia Dyslipidemia FEN: IV fluids/regular diet ID: Cefepime 2 doses d/ced 10/25/Oral vancomycin day 6.  Continue oral vancomycin  for 14 day course, stop date 01/14/2016 DVT: Heparin   Reluctant to go home today.  I have encouraged discharge soon with home health PT and OT.    LOS: 5 days    Thailan Sava M 01/05/2016

## 2016-01-05 NOTE — Progress Notes (Signed)
Family Medicine Teaching Service Consult Note Intern Pager: [phone]  Patient name: [name] Medical record number: [id number] Date of birth: Feb 02, 1951 Age: 65 y.o. Gender: female  Primary Care Provider: Georges Lynch, MD Consultants: Gastroenterology, Surgery Code Status: Full   Pt Overview and Major Events to Date:  9/28: Laproscopic Cholecystectyomy 10/3-10/20: Biliary stent, R LQ drain, thoracentesis  Assessment and Plan: [name] is a 65 y/o F with a past medical history significant HLD, anxiety, depression, CKD stage III, migraines, and CVA s/p laparoscopic cholecysectomy who presented with fever, weakness and diarrhea secondary to C.difficile infection.  #Sepsis, resolved --Will continue to monitor vitals.  #Diarrhea, improving Will continue to monitor, patient still symptomatic but improving. --Continue vancomycin 125 mg q6 most likely for 14 days. DAY 6 --Continue Enteric precautions --Started Probiotics yesterday (10/27)  #Post-thoracotomy pain syndrome --Continue Neurontin 600 mg bid --Oxycodone 5-325 Q4 prn  #HTN, stable  This morning BP 130/78 --Continue to hold home Ramipril 2.5 mg daily --Will restart   #History of CVA in 2015, stable With reported residual right sided weakness of right foot and right hand.  Was started on Plavix.  --Hold Plavix for now given risk of bleeding  #LE swelling.  With no formal diagnosis of CHF. Last Echo 12/2015 with EF 55-60%.  At home on 20 mg Lasix daily.  No LE edema this morning. Will hold off on lasix.  --Hold home Lasix for now, given patient euvolemic on exam and concern for sepsis -Reassess fluid status in AM and consider restarting in AM   #Hypokalemia, resolved  K+ 3.6 (10/28) --Will continue to replete as needed   --Follow BMET in AM.   #Hyperlipidemia:  Last lipid panel: Chol 145, TG 49, HDL 67, LDL 68. --Continue home Lipitor 40 mg daily  #CKD Stage III.   Cr on admission 0.91. Has  improved throughout stay 0.63 (10/28). --Avoid nephrotoxic agents --Renally dose medications  #Migraines --Continue home Topamax 100 mg bid  #Anxiety.   Reports also trouble sleeping.  --Continue home Doxepin 200 mg daily at bedtime  #Depression --Continue home Zoloft 50 mg bid  FEN/GI: regular diet PPx: SCDs, claritin   Disposition: Progressing but will need a few more days for resolution of diarrhea and reconditioning after prolong hospital stay   Subjective:  Patient continue to improve only 3 BMs in the past 24 hrs. Patient on soft diet and seems to be tolerating it well. Patient endorses some dry cough and rhinorrhea in the past 24 hrs most likely allergy.  Objective: Temp:  [98.1 F (36.7 C)-99 F (37.2 C)] 98.6 F (37 C) (10/28 0546) Pulse Rate:  [104-115] 104 (10/28 0546) Resp:  [17-18] 17 (10/28 0546) BP: (134-140)/(78-84) 139/84 (10/28 0546) SpO2:  [97 %-100 %] 98 % (10/28 0546) Weight:  [153 lb (69.4 kg)] 153 lb (69.4 kg) (10/27 2029)  Physical Exam: General: [name], female lying in hospital bed, in NAD HEENT: EOMI, MMM Cardiovascular: RRR, no MRG, 2+ pulses  Respiratory: CTA B/L no wheezing noted Abdomen: obese, soft, NT ND, biliary drain in place covered with dry bandage,  Extremities: warm, well perfused, no edema or tenderness, moves all extremities spontaneously Neuro: [name]  Laboratory:  Recent Labs Lab 01/01/16 1400 01/02/16 2153 01/05/16 0500  WBC 9.5 6.4 8.8  HGB 9.1* 9.6* 9.1*  HCT 28.2* 29.1* 28.4*  PLT 247 278 337    Recent Labs Lab 12/31/15 0950  01/01/16 1407  01/02/16 2153 01/03/16 0415 01/05/16 0500  NA 135  < >  --   < >  136 138 140  K 3.3*  < >  --   < > 3.5 4.5 3.6  CL 102  < >  --   < > 113* 115* 114*  CO2 22  < >  --   < > 19* 18* 21*  BUN 12  < >  --   < > <5* <5* <5*  CREATININE 1.17*  < >  --   < > 0.65 0.71 0.63  CALCIUM 8.1*  < >  --   < > 7.5* 7.4* 7.7*  PROT 6.3*  --  4.4*  --   --   --   --   BILITOT 0.7   --   --   --   --   --   --   ALKPHOS 196*  --   --   --   --   --   --   ALT 29  --   --   --   --   --   --   AST 45*  --   --   --   --   --   --   GLUCOSE 137*  < >  --   < > 111* 92 101*  < > = values in this interval not displayed.  Imaging/Diagnostic Tests: No results found.  Marjie Skiff, MD 01/05/2016, 9:27 AM PGY-1, [place] Intern pager: [phone], text pages welcome

## 2016-01-06 ENCOUNTER — Inpatient Hospital Stay (HOSPITAL_COMMUNITY): Payer: BLUE CROSS/BLUE SHIELD

## 2016-01-06 LAB — C DIFFICILE QUICK SCREEN W PCR REFLEX
C Diff antigen: NEGATIVE
C Diff interpretation: NEGATIVE
C Diff toxin: NEGATIVE

## 2016-01-06 MED ORDER — FERROUS SULFATE 325 (65 FE) MG PO TABS
325.0000 mg | ORAL_TABLET | Freq: Two times a day (BID) | ORAL | Status: DC
  Administered 2016-01-06 – 2016-01-07 (×3): 325 mg via ORAL
  Filled 2016-01-06 (×3): qty 1

## 2016-01-06 MED ORDER — FUROSEMIDE 20 MG PO TABS
20.0000 mg | ORAL_TABLET | Freq: Once | ORAL | Status: AC
Start: 2016-01-06 — End: 2016-01-06
  Administered 2016-01-06: 20 mg via ORAL
  Filled 2016-01-06: qty 1

## 2016-01-06 NOTE — Progress Notes (Signed)
Family Medicine Teaching Service Consult Note Intern Pager: [phone]  Patient name: [name] Medical record number: [id number] Date of birth: 1950/11/14 Age: 65 y.o. Gender: female  Primary Care Provider: Georges Lynch, MD Consultants: Gastroenterology, Surgery Code Status: Full   Pt Overview and Major Events to Date:  9/28: Laproscopic Cholecystectyomy 10/3-10/20: Biliary stent, R LQ drain, thoracentesis  Assessment and Plan: [name] is a 65 y/o F with a past medical history significant HLD, anxiety, depression, CKD stage III, migraines, and CVA s/p laparoscopic cholecysectomy who presented with fever, weakness and diarrhea secondary to C.difficile infection.  Sepsis, resolved --Will continue to monitor vitals.  Diarrhea, improving Will continue to monitor, patient still symptomatic but improving. --Continue vancomycin 125 mg q6 most likely for 14 days. DAY 7 --Continue Enteric precautions --Started Probiotics on 10/7  Post-thoracotomy pain syndrome --Continue Neurontin 600 mg bid --Oxycodone 5-325 Q4 prn  HTN, stable  This morning BP 130/78 --Continue to hold home Ramipril 2.5 mg daily --Will restart   History of CVA in 2015, stable With reported residual right sided weakness of right foot and right hand.  Was started on Plavix.  --Hold Plavix for now given risk of bleeding  LE swelling.  With no formal diagnosis of CHF. Last Echo 12/2015 with EF 55-60%.  At home on 20 mg Lasix daily.  1+ pitting edema above knees bilaterally and some crackles on lung exam.  Will give 20mg  lasix x1.  --Hold home Lasix for now, given patient euvolemic on exam -Reassess fluid status in AM and consider restarting in AM   Hypokalemia, resolved  K+ 3.6 (10/28) --Will continue to replete as needed    Hyperlipidemia:  Last lipid panel: Chol 145, TG 49, HDL 67, LDL 68. --Continue home Lipitor 40 mg daily  CKD Stage III.   Cr on admission 0.91. Has improved throughout  stay 0.63 (10/28). --Avoid nephrotoxic agents --Renally dose medications  Migraines --Continue home Topamax 100 mg bid  Anxiety.   Reports also trouble sleeping.  --Continue home Doxepin 200 mg daily at bedtime  Depression --Continue home Zoloft 50 mg bid  FEN/GI: regular diet PPx: SCDs, claritin   Disposition:Possible discharge home today  Subjective:  Patient feels improvement, but still feels weak.  Was cleared by PT/OT and they did not recommend [facility] care.  Patient concerned about possible readmission given her complications.  Does have some fluid overload in her legs today, but has not really been moving around much.   Objective: Temp:  [98.4 F (36.9 C)-99.5 F (37.5 C)] 98.6 F (37 C) (10/29 0537) Pulse Rate:  [102-104] 102 (10/29 0537) Resp:  [18] 18 (10/29 0537) BP: (132-138)/(67-88) 132/67 (10/29 0537) SpO2:  [95 %-99 %] 95 % (10/29 0537) Weight:  [160 lb 11.5 oz (72.9 kg)] 160 lb 11.5 oz (72.9 kg) (10/29 [id number])  Physical Exam: General: [name], female lying in hospital bed, in NAD HEENT: EOMI, MMM Cardiovascular: RRR, no MRG, 2+ pulses  Respiratory: CTA B/L no wheezing noted Abdomen: obese, soft, NT ND, biliary drain in place covered with dry bandage,  Extremities: warm, well perfused, 1+ pitting edema above knees bilaterally without tenderness, moves all extremities spontaneously Neuro: [name]  Laboratory:  Recent Labs Lab 01/01/16 1400 01/02/16 2153 01/05/16 0500  WBC 9.5 6.4 8.8  HGB 9.1* 9.6* 9.1*  HCT 28.2* 29.1* 28.4*  PLT 247 278 337    Recent Labs Lab 12/31/15 0950  01/01/16 1407  01/02/16 2153 01/03/16 0415 01/05/16 0500  NA 135  < >  --   < >  136 138 140  K 3.3*  < >  --   < > 3.5 4.5 3.6  CL 102  < >  --   < > 113* 115* 114*  CO2 22  < >  --   < > 19* 18* 21*  BUN 12  < >  --   < > <5* <5* <5*  CREATININE 1.17*  < >  --   < > 0.65 0.71 0.63  CALCIUM 8.1*  < >  --   < > 7.5* 7.4* 7.7*  PROT 6.3*  --  4.4*  --   --   --   --    BILITOT 0.7  --   --   --   --   --   --   ALKPHOS 196*  --   --   --   --   --   --   ALT 29  --   --   --   --   --   --   AST 45*  --   --   --   --   --   --   GLUCOSE 137*  < >  --   < > 111* 92 101*  < > = values in this interval not displayed.  Imaging/Diagnostic Tests: No results found.  Eloise Levels, MD 01/06/2016, 8:39 AM PGY-1, [place] Intern pager: [phone], text pages welcome

## 2016-01-06 NOTE — Progress Notes (Addendum)
Subjective: Stable and alert. Ambulating in hall with walker and assistance Still feels weak and concerned about falling at home  Had a 30 minute  Talk with daughter who is very concerned about discharge home and does not want a third readmission.  I understand.   At her reasonable request we are going to repeat chest x-ray to be sure her pleural effusions have not recurred, repeat stool for C. difficile, and repeat CBC to check WBC Case management will be involved to arrange home health PT and OT. Started on iron for anemia We went over everything and I think we answered all of her questions.  Discussed with family practice service and they're going to arrange for her to get a low-cost [facility]comycin [facility].  Objective: Vital signs in last 24 hours: Temp:  [98.4 F (36.9 C)-99.5 F (37.5 C)] 98.6 F (37 C) (10/29 0537) Pulse Rate:  [102-104] 102 (10/29 0537) Resp:  [18] 18 (10/29 0537) BP: (132-138)/(67-88) 132/67 (10/29 0537) SpO2:  [95 %-99 %] 95 % (10/29 0537) Weight:  [72.9 kg (160 lb 11.5 oz)] 72.9 kg (160 lb 11.5 oz) (10/29 0612) Last BM Date: 01/05/16  Intake/Output from previous day: 10/28 0701 - 10/29 0700 In: 740 [P.O.:720; I.V.:20] Out: 350 [Drains:350] Intake/Output this shift: Total I/O In: -  Out: 70 [Drains:70]    General appearance: Alert.  No distress.  Deconditioned. Resp: clear to auscultation bilaterally GI: Soft.  Nontender.  Nondistended.   both drains with clear bile.  Drainage is less..   Lab Results:   Recent Labs  01/05/16 0500  WBC 8.8  HGB 9.1*  HCT 28.4*  PLT 337   BMET  Recent Labs  01/05/16 0500  NA 140  K 3.6  CL 114*  CO2 21*  GLUCOSE 101*  BUN <5*  CREATININE 0.63  CALCIUM 7.7*   PT/INR No results for input(s): LABPROT, INR in the last 72 hours. ABG No results for input(s): PHART, HCO3 in the last 72 hours.  Invalid input(s): PCO2, PO2  Studies/Results: No results  found.  Anti-infectives: Anti-infectives    Start     Dose/Rate Route Frequency Ordered Stop   01/02/16 1100  ceFEPIme (MAXIPIME) 2 g in dextrose 5 % 50 mL IVPB  Status:  Discontinued     2 g 100 mL/hr over 30 Minutes Intravenous Every 24 hours 01/01/16 1349 01/02/16 1011   01/01/16 1100  ceFEPIme (MAXIPIME) 2 g in dextrose 5 % 50 mL IVPB  Status:  Discontinued     2 g 100 mL/hr over 30 Minutes Intravenous Every 24 hours 12/31/15 1128 01/01/16 1303   12/31/15 2300  vancomycin (VANCOCIN) IVPB 750 mg/150 ml premix  Status:  Discontinued     750 mg 150 mL/hr over 60 Minutes Intravenous Every 12 hours 12/31/15 1128 01/01/16 1345   12/31/15 1800  vancomycin (VANCOCIN) 50 mg/mL oral solution 125 mg     125 mg Oral Every 6 hours 12/31/15 1731     12/31/15 0945  ceFEPIme (MAXIPIME) 2 g in dextrose 5 % 50 mL IVPB     2 g 100 mL/hr over 30 Minutes Intravenous  Once 12/31/15 0932 12/31/15 1050   12/31/15 0945  vancomycin (VANCOCIN) IVPB 1000 mg/200 mL premix  Status:  Discontinued     1,000 mg 200 mL/hr over 60 Minutes Intravenous  Once 12/31/15 0932 12/31/15 0933   12/31/15 0945  vancomycin (VANCOCIN) 1,500 mg in sodium chloride 0.9 % 500 mL IVPB  1,500 mg 250 mL/hr over 120 Minutes Intravenous  Once 12/31/15 0933 12/31/15 1224      Assessment/Plan:    Sepsis - with C. difficile colitis  S/p laparoscopic cholecystectomy 12/06/15. Readmitted 12/11/15 with biloma and ongoing bile leak ERCP with biliary stent placement 12/12/15 Dr. Wilfrid Lund; IR drain placement 12/17/15  Uncontrolled diarrhea x 24 hours VirdansStreptococcus Bacteremia - Zosyn/Vancomycin 10 days; Rocephin/Flagyl completed 12/24/15 - Dr. Tommy Medal Resolved  Recurrent right pleural effusion; Bilateral pleural effusions R>L, s/p right thoracentesis 12/27/15 -m 1.8 L repeat right thoracentesis 12/31/15 1 liter by IR.  Pleural fluid cultures negative. Two-view chest x-ray today  Chronic kidney disease stage III Creatinine  is now normal Anemia I have started iron therapy twice a day- Weakness and debilitation -I have requested case management arranged home health PT and OT. Possible oral candidiasis - Magic mouth wash Malnutrition - nutrition consult History of TIA on Plavix GERD - PPI Hypertension History of occipital neuralgia Dyslipidemia FEN: IV fluids/regular diet ID: Cefepime 2 doses d/ced 10/25/Oral vancomycin day 6.  Continue oral vancomycin for 14 day course, stop date 01/14/2016 DVT: Heparin   Check chest x-ray today  Repeat stool C. difficile today  CBC today   Hopefully discharge home tomorrow    LOS: 6 days    Akari Crysler M 01/06/2016

## 2016-01-07 ENCOUNTER — Inpatient Hospital Stay (HOSPITAL_COMMUNITY): Payer: BLUE CROSS/BLUE SHIELD

## 2016-01-07 DIAGNOSIS — R0602 Shortness of breath: Secondary | ICD-10-CM

## 2016-01-07 LAB — BASIC METABOLIC PANEL
Anion gap: 9 (ref 5–15)
BUN: 6 mg/dL (ref 6–20)
CO2: 20 mmol/L — ABNORMAL LOW (ref 22–32)
Calcium: 8.3 mg/dL — ABNORMAL LOW (ref 8.9–10.3)
Chloride: 110 mmol/L (ref 101–111)
Creatinine, Ser: 0.79 mg/dL (ref 0.44–1.00)
GFR calc Af Amer: >60 mL/min (ref >60)
GFR calc non Af Amer: >60 mL/min (ref >60)
Glucose, Bld: 147 mg/dL — ABNORMAL HIGH (ref 65–99)
Potassium: 3.2 mmol/L — ABNORMAL LOW (ref 3.5–5.1)
Sodium: 139 mmol/L (ref 135–145)

## 2016-01-07 LAB — BODY FLUID CELL COUNT WITH DIFFERENTIAL
Eos, Fluid: 0 %
Lymphs, Fluid: 52 %
Monocyte-Macrophage-Serous Fluid: 27 % — ABNORMAL LOW (ref 50–90)
Neutrophil Count, Fluid: 21 % (ref 0–25)
Total Nucleated Cell Count, Fluid: 210 cu mm (ref 0–1000)

## 2016-01-07 LAB — PROTEIN, BODY FLUID: Total protein, fluid: 3 g/dL

## 2016-01-07 LAB — LACTATE DEHYDROGENASE, PLEURAL OR PERITONEAL FLUID: LD, Fluid: 118 U/L — ABNORMAL HIGH (ref 3–23)

## 2016-01-07 LAB — CBC
HCT: 27.8 % — ABNORMAL LOW (ref 36.0–46.0)
Hemoglobin: 8.6 g/dL — ABNORMAL LOW (ref 12.0–15.0)
MCH: 27.7 pg (ref 26.0–34.0)
MCHC: 30.9 g/dL (ref 30.0–36.0)
MCV: 89.7 fL (ref 78.0–100.0)
Platelets: 341 10*3/uL (ref 150–400)
RBC: 3.1 MIL/uL — ABNORMAL LOW (ref 3.87–5.11)
RDW: 16.3 % — ABNORMAL HIGH (ref 11.5–15.5)
WBC: 8 10*3/uL (ref 4.0–10.5)

## 2016-01-07 LAB — GRAM STAIN

## 2016-01-07 LAB — LACTATE DEHYDROGENASE: LDH: 267 U/L — ABNORMAL HIGH (ref 98–192)

## 2016-01-07 LAB — GLUCOSE, SEROUS FLUID: Glucose, Fluid: 115 mg/dL

## 2016-01-07 LAB — ALBUMIN: Albumin: 2.3 g/dL — ABNORMAL LOW (ref 3.5–5.0)

## 2016-01-07 LAB — ALBUMIN, FLUID (OTHER): Albumin, Fluid: 1.6 g/dL

## 2016-01-07 MED ORDER — RAMIPRIL 2.5 MG PO CAPS: ORAL_CAPSULE | ORAL | 3 refills | Status: DC

## 2016-01-07 MED ORDER — FUROSEMIDE 20 MG PO TABS
20.0000 mg | ORAL_TABLET | Freq: Every day | ORAL | Status: DC
  Administered 2016-01-08 – 2016-01-14 (×7): 20 mg via ORAL
  Filled 2016-01-07 (×7): qty 1

## 2016-01-07 MED ORDER — VANCOMYCIN 50 MG/ML ORAL SOLUTION: 125.0000 mg | Freq: Four times a day (QID) | ORAL | 0 refills | Status: DC

## 2016-01-07 MED ORDER — CLOPIDOGREL BISULFATE 75 MG PO TABS
75.0000 mg | ORAL_TABLET | Freq: Every day | ORAL | Status: DC
  Administered 2016-01-12 – 2016-01-14 (×2): 75 mg via ORAL
  Filled 2016-01-07 (×6): qty 1

## 2016-01-07 MED ORDER — FUROSEMIDE 10 MG/ML IJ SOLN
40.0000 mg | Freq: Two times a day (BID) | INTRAMUSCULAR | Status: AC
  Administered 2016-01-07 (×2): 40 mg via INTRAVENOUS
  Filled 2016-01-07 (×2): qty 4

## 2016-01-07 MED ORDER — VANCOMYCIN HCL IN DEXTROSE 1-5 GM/200ML-% IV SOLN
1000.0000 mg | Freq: Two times a day (BID) | INTRAVENOUS | Status: DC
  Administered 2016-01-08 (×3): 1000 mg via INTRAVENOUS
  Filled 2016-01-07 (×4): qty 200

## 2016-01-07 MED ORDER — ACETAMINOPHEN 325 MG PO TABS: 650.0000 mg | ORAL_TABLET | Freq: Four times a day (QID) | ORAL | Status: DC | PRN

## 2016-01-07 MED ORDER — DEXTROSE 5 % IV SOLN
2.0000 g | Freq: Two times a day (BID) | INTRAVENOUS | Status: DC
  Administered 2016-01-07 – 2016-01-09 (×4): 2 g via INTRAVENOUS
  Filled 2016-01-07 (×5): qty 2

## 2016-01-07 MED ORDER — FUROSEMIDE 10 MG/ML IJ SOLN: 40.0000 mg | Freq: Once | INTRAMUSCULAR | Status: DC

## 2016-01-07 MED ORDER — LIDOCAINE HCL 1 % IJ SOLN
INTRAMUSCULAR | Status: AC
  Filled 2016-01-07: qty 20

## 2016-01-07 MED ORDER — SACCHAROMYCES BOULARDII 250 MG PO CAPS: ORAL_CAPSULE | ORAL | Status: DC

## 2016-01-07 NOTE — Progress Notes (Signed)
Subjective: She feels much better. Using the toilet now for bowel movements only 2 recorded yesterday. She is breathing comfortably without oxygen, and tolerating diet. Repeat chest x-ray yesterday shows recurrent right pleural effusion.  Also complaining of night sweats, I don't have an answer for why she is having night sweats.  Objective: Vital signs in last 24 hours: Temp:  [97.7 F (36.5 C)-98.8 F (37.1 C)] 97.7 F (36.5 C) (10/30 0505) Pulse Rate:  [96-115] 96 (10/30 0505) Resp:  [16] 16 (10/30 0505) BP: (130-149)/(72-90) 130/76 (10/30 0505) SpO2:  [96 %-99 %] 98 % (10/30 0505) Weight:  [77.8 kg (171 lb 8.3 oz)] 77.8 kg (171 lb 8.3 oz) (10/30 0500) Last BM Date: 01/06/16 480 PO recorded yesterday voided x 6 Stool x 2 recorded Afebrile, VSS WBC is normal  CXR yesterday shows moderate right effusion, larger int he interval    Intake/Output from previous day: 10/29 0701 - 10/30 0700 In: 510 [P.O.:480; I.V.:30] Out: 120 [Drains:120] Intake/Output this shift: Total I/O In: 240 [P.O.:240] Out: 500 [Urine:500]  General appearance: alert, cooperative and no distress Resp: Decreased breath sounds right base. Otherwise clear. GI: soft, non-tender; bowel sounds normal; no masses,  no organomegaly Wight 70.8 KG on 12/31/15- weight this AM 77.8 KG    15.4 lbs up Lab Results:   Recent Labs  01/05/16 0500 01/07/16 0305  WBC 8.8 8.0  HGB 9.1* 8.6*  HCT 28.4* 27.8*  PLT 337 341    BMET  Recent Labs  01/05/16 0500  NA 140  K 3.6  CL 114*  CO2 21*  GLUCOSE 101*  BUN <5*  CREATININE 0.63  CALCIUM 7.7*   PT/INR No results for input(s): LABPROT, INR in the last 72 hours.   Recent Labs Lab 01/01/16 1407  PROT 4.4*     Lipase     Component Value Date/Time   LIPASE 47 12/16/2015 0241     Studies/Results: Dg Chest 2 View  Result Date: 01/06/2016 CLINICAL DATA:  Assess bilateral pleural effusions. Right-sided thoracentesis October 2019. EXAM: CHEST   2 VIEW COMPARISON:  December 31, 2015 FINDINGS: The right central line terminates in the central SVC. No pneumothorax. A moderate right-sided pleural effusion is identified, larger in the interval. A tiny left effusion is identified. Pigtail catheters project over the right upper quadrant of the abdomen. No other interval changes or acute abnormalities. IMPRESSION: Moderate right pleural effusion, larger in the interval. Electronically Signed   By: Dorise Bullion III M.D   On: 01/06/2016 11:06    Medications: . doxepin  200 mg Oral QHS  . famotidine  20 mg Oral BID  . feeding supplement (PRO-STAT SUGAR FREE 64)  30 mL Oral BID  . ferrous sulfate  325 mg Oral BID WC  . gabapentin  600 mg Oral BID  . heparin  5,000 Units Subcutaneous Q8H  . loratadine  10 mg Oral Daily  . magic mouthwash w/lidocaine  5 mL Oral TID AC & HS  . saccharomyces boulardii  250 mg Oral BID  . sertraline  50 mg Oral BID  . topiramate  100 mg Oral BID  . vancomycin  125 mg Oral Q6H    Assessment/Plan S/p laparoscopic cholecystectomy 12/06/15. 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 Wilfrid Lund; IR drain placement 12/17/15 Uncontrolled diarrhea x 24 hours VirdansStreptococcus Bacteremia - Zosyn/Vancomycin 10 days; Rocephin/Flagyl completed 12/24/15 - Dr. Tommy Medal Recurrent right pleural effusion; Bilateral pleural effusions R>L, s/p right thoracentesis 12/27/15 -m 1.8  L repeat right thoracentesis 12/31/15 1 liter by IR Chronic kidney disease stage III Creatinine is improved Weakness and debilitation - get PT and OT involved Possible oral candidiasis - Magic mouth wash Malnutrition - nutrition consult Fluid overload 15.4 lbs up- home lasix restarting today. History of TIA on Plavix GERD - PPI Hypertension History of occipital neuralgia Dyslipidemia FEN: IV fluids/regular diet ID: Cefepime 2 doses d/ced 10/25/Oral vancomycin day 8/14 DVT: Heparin    Plan: Dr. Dalbert Batman  has recommended repeat thoracentesis on the right for the recurrent pleural effusion prior to discharge. I have asked IR to see and perform thoracentesis. Restart home lasix for fluid overload Going to check with MD, she is 13 liter positive for this hospitalization  LOS: 7 days    Nakiya Rallis 01/07/2016 [phone]

## 2016-01-07 NOTE — Progress Notes (Signed)
PT Cancellation Note  Patient Details Name: [name] MRN: [id number] DOB: 08-31-1950   Cancelled Treatment:    Reason Eval/Treat Not Completed: Patient at procedure or test/unavailablePt off unit for CT. PT will check on pt later as time allows.    Salina April, PTA Pager: [phone]   01/07/2016, 3:44 PM

## 2016-01-07 NOTE — Progress Notes (Signed)
PULMONARY / CRITICAL CARE MEDICINE   Name: [name] MRN: [id number] DOB: 1951/02/06    ADMISSION DATE:  12/31/2015 CONSULTATION DATE:  12/31/2015, re consulted 10/30  REFERRING MD: Dr. Brantley Stage  CHIEF COMPLAINT:  Pleural effusion   HISTORY OF PRESENT ILLNESS:   [name] is a 65 y/o F with hx CKD 3, CVA 2015 with recent hospitalization 10/3-10/20 with sepsis r/t bile leak and biloma. Leak was treated with biliary stent; biloma treated with percutaneous RUQ drain.  Blood cultures that admit grew Gram pos and gram neg rods, strep viridans.  TEE was neg for endocarditis.  That hospitalization also c/b R pleural effusion s/p thoracentesis.  She returned 10/23 with weakness, fever, copious diarrhea, abd pain, SOB and fever.  CCM originally consulted on admit 10/23 for central line placement and assistance with sepsis management. Found to have CDiff and treated appropriately, followed by ID.    On 10/30 PCCM re-consulted for ongoing pleural effusion and increasing SOB.     SUBJECTIVE:  Some mild SOB, worse with exertion.  Fairly comfortable at rest.  Occasional dry cough.  Also c/o BLE edema.  Denies chest pain, hemoptysis, fevers/chills, abd pain.   VITAL SIGNS: BP 130/76 (BP Location: Left Arm)   Pulse 96   Temp 97.7 F (36.5 C) (Oral)   Resp 16   Ht 5\' 7"  (1.702 m)   Wt 77.8 kg (171 lb 8.3 oz)   SpO2 98%   BMI 26.86 kg/m    INTAKE / OUTPUT: I/O last 3 completed shifts: In: 71 [P.O.:480; I.V.:30] Out: 120 [Drains:120]  PHYSICAL EXAMINATION: General: pleasant female, NAD  Neuro: AOx3 HEENT: NCAT Cardiovascular: s1s2 rrr Lungs: resps even non labored on RA, diminished R base  Abdomen: RUQ percutaneous drain and biliary tube with greenish fluid Musculoskeletal:1+ BLE edema  Skin: Flushed, warm skin.   LABS:  BMET  Recent Labs Lab 01/02/16 2153 01/03/16 0415 01/05/16 0500  NA 136 138 140  K 3.5 4.5 3.6  CL 113* 115* 114*  CO2 19* 18* 21*  BUN <5*  <5* <5*  CREATININE 0.65 0.71 0.63  GLUCOSE 111* 92 101*    Electrolytes  Recent Labs Lab 01/02/16 2153 01/03/16 0415 01/05/16 0500  CALCIUM 7.5* 7.4* 7.7*    CBC  Recent Labs Lab 01/02/16 2153 01/05/16 0500 01/07/16 0305  WBC 6.4 8.8 8.0  HGB 9.6* 9.1* 8.6*  HCT 29.1* 28.4* 27.8*  PLT 278 337 341    Coag's  Recent Labs Lab 01/01/16 1400  APTT 37*  INR 1.28    Sepsis Markers  Recent Labs Lab 12/31/15 1339 01/01/16 0430  LATICACIDVEN 3.26* 0.7    ABG No results for input(s): PHART, PCO2ART, PO2ART in the last 168 hours.  Liver Enzymes No results for input(s): AST, ALT, ALKPHOS, BILITOT, ALBUMIN in the last 168 hours.  Cardiac Enzymes No results for input(s): TROPONINI, PROBNP in the last 168 hours.  Glucose  Recent Labs Lab 01/01/16 1158 01/02/16 0739  GLUCAP 124* 92    Imaging Dg Chest 2 View  Result Date: 01/06/2016 CLINICAL DATA:  Assess bilateral pleural effusions. Right-sided thoracentesis October 2019. EXAM: CHEST  2 VIEW COMPARISON:  December 31, 2015 FINDINGS: The right central line terminates in the central SVC. No pneumothorax. A moderate right-sided pleural effusion is identified, larger in the interval. A tiny left effusion is identified. Pigtail catheters project over the right upper quadrant of the abdomen. No other interval changes or acute abnormalities. IMPRESSION: Moderate right pleural effusion, larger in  the interval. Electronically Signed   By: Dorise Bullion III M.D   On: 01/06/2016 11:06    STUDIES:    CULTURES: BCx 10/2 >> Strep Viridans UCx 10/2 >> No Growth Body Fluid Cx >> No Growth BCx 10/23 >> neg UCx 10/23 >> neg Pleural Fluid Cx 10/23 >> neg  C. Diff 10/23: Ag positive, toxin negative CDiff 10/29>> neg Ag and toxin  ANTIBIOTICS: PO vanc 10/23 >>   SIGNIFICANT EVENTS: 9/28: Cholecystectomy 12/11/15 to 12/28/15: Hospitalized for sepsis 2/2 bactermia  10/19> R thora 10/23: Re-admitted with concern  for sepsis 10/23 thoracentesis>> 1L off>> WBC 1388 with 53%neutophils, glucose 140 but no protein or LDH results seen   ASSESSMENT / PLAN:  Recurrent R pleural effusion - s/p thoracentesis x 2 on 10/19 and 10/23 both culture neg.   On 10/23 thora -->WBC 1388 with 53%neutophils, glucose 140 but no protein or LDH results seen, ?sent. Culture neg.  Does have diastolic dysfunction on most recent echo.  13L POS since admit with BLE edema.  Suspect transudative r/t volume overload.   REC -  Gentle diuresis - will add lasix 40mg  IV BID today  Would hold off on further thoracentesis for now given clinical stability, risk for infection with multiple thora  F/u CXR in am  Discussed with Modena Jansky PA for gen surgery    Nickolas Madrid, NP 01/07/2016  10:25 AM Pager: [phone] or [phone]  Attending note: I have seen and examined the patient with nurse practitioner/resident and agree with the note. History, labs and imaging reviewed.  65 y/o with admission for sepsis from C diff. Recent cholecystectomy PCCM consulted for recurrent rt pleural effusion s/p thoracentesis X 2 with no diagnosis. All imaging and labs reviewed. It would be reasonable to diurese her first to see if the effusion changes. However as she is already down in IR for the tap I would check the following labs Fluid LDH, protein, albumin, glucose, Ph, cultures and cytology.  We will follow.  Marshell Garfinkel MD [facility] Pager [phone] If no answer or after 3pm call: [phone] 01/07/2016, 2:11 PM

## 2016-01-07 NOTE — Progress Notes (Addendum)
OT Cancellation Note  Patient Details Name: [name] MRN: [id number] DOB: 07-27-1950   Cancelled Treatment:    Reason Eval/Treat Not Completed: Patient at procedure or test/ unavailable. Pt leaving with transport services for procedure on OT arrival. Will return as able.  [address] OTR/L [id number] 01/07/2016, 11:19 AM

## 2016-01-07 NOTE — Procedures (Signed)
Successful US guided right thoracentesis. Yielded 1L of hazy, tellow fluid. Pt tolerated procedure well. No immediate complications.  Specimen was sent for labs. CXR ordered.  Ascencion Dike PA-C 01/07/2016 12:11 PM

## 2016-01-07 NOTE — Care Management (Addendum)
Spoke with husband at bedside , explained he would have to arrange 24 hour supervision , he stated he has no one to assist him him at home. Offered Careers information officer , he stated he can not afford same. Husband states his wife is too sick to good home at present and believes she will not need supervision at discharge. Again he is aware hospital does not arrange same.  Will wait on PT/OT re evals .   MD/PA if you feel patient would benefit from [facility] for disease    Management, please order.   Received consult for home health needs. No recommendations from PT/OT . Paged PA to see if there is a need for [facility] ,awaiting call back. Per [facility] no dressing changes, drain flushes , called bedside nurse , awaiting call back.   Magdalen Spatz RN BSN [phone]

## 2016-01-07 NOTE — Discharge Summary (Signed)
Physician Discharge Summary  Patient ID: [name] MRN: [id number] DOB/AGE: 1950-10-13 65 y.o.  Admit date: 12/31/2015 Discharge date: 01/14/2016  Admission Diagnoses:  Sepsis - uncertain etiology S/p laparoscopic cholecystectomy 12/06/15 Dr. Barry Dienes.  ERCP with biliary stent placement 12/12/15 Dr. 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 Tommy Medal Recurrent right pleural effusion;  Bilateral pleural effusions R>L, s/p right thoracentesis 12/27/15 -m 1.8 L Chronic kidney disease stage III  Creatinine is 1.17 on admit today History of TIA on Plavix GERD Hypertension History of occipital neuralgia Dyslipidemia  Discharge Diagnoses: Sepsis with C difficile colitis S/p laparoscopic cholecystectomy 12/06/15. 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 Wilfrid Lund; IR drain placement 12/17/15 Uncontrolled diarrhea x 24 hours VirdansStreptococcus Bacteremia - Zosyn/Vancomycin 10 days; Rocephin/Flagyl completed 12/24/15 - Dr. Tommy Medal Recurrent right pleural effusion; Bilateral pleural effusions R>L, s/p right thoracentesis 12/27/15 -m 1.8 L repeat right thoracentesis 12/31/15 1 liter by IR, 01/07/16 RUL pneumonia 01/07/16 - Treated with Vancomycin 2 day, cefepime 3 days, Levoflaxin 4 days, completed 01/12/16 Oral candidiasis - resolved Chronic kidney disease stage III Creatinine is improved Weakness and debilitation  Possible oral candidiasis  Malnutrition - nutrition consult Fluid overload 15.4 lbs up History of TIA on Plavix GERD - PPI Hypertension History of occipital neuralgia Dyslipidemia    Active Problems:   Pneumonia   HTN (hypertension)   HLD (hyperlipidemia)   Sepsis (HCC)   Fever   Recurrent Pleural effusion on right   Bilateral pleural effusion    Encounter for central line placement   Diarrhea of presumed infectious origin   LFT elevation   Hypokalemia   Enteritis due to Clostridium difficile   Difficulty in walking, not elsewhere classified   SOB (shortness of breath)   PROCEDURES: IR  RightThoracentesis 01/07/16  Consults:   Family Medicine - Dr. Marjie Skiff, MD Infectious Disease 12/31/15:  Dr. Carlyle Basques GI:  Dr. Wilfrid Lund III   Hospital Course:  Pt presented with: Weakness and diarrhea cannot get out of bed, right sided tenderness and SOB. Patient reports she was fine on Friday and Saturday. Sunday she started running a fever 102.3 and last evening 103. Patient did not want to come back to the emergency department. She started having loose stools last evening she says it smells bad, and this a.m. the diarrhea is uncontrolled. She is weak and could not get out of bed. Her husband called EMS and she was transported to the ED.  HPI: Patient recently hospitalized from 12/11/15 through 12/28/15 with a biloma, status post cholecystectomy 12/06/15 with intraoperative cholangiogram Dr. Stark Klein. During that hospitalization she underwent ERCP with biliary stent placement on 12/12/15 by Dr. Wilfrid Lund. She underwent right lower quadrant drain placement by IR with a 12 French pigtail drain catheter, also on 12/17/15. She developed a bacteremia growing out. Ends group streptococci and was seen by infectious disease. The last note, from Dr. Tommy Medal on 12/20/15 reported 10 days of effective antibiotics he recommended IV Rocephin and oral Flagyl through 12/24/15 and then discontinuing antibiotics. She also developed a right pleural effusion and underwent thoracentesis on 12/27/15. Thoracentesis that time yielded 1.8 L of pleural fluid.  She showed good improvement result and was discharged home on 12/28/15.  Workup in the ED shows fever with MAXIMUM TEMPERATURE of 101.4. Tachypnea with rate up to 33, and tachycardia.  CMP shows a  slight rise in the creatinine up to 1.17 mild, mild alkaline phosphatase elevation of 196. Lactate of 1.91. WBC of 16.8. There is a left shift. H&H are stable platelets are normal. Blood cultures were obtained in the ED. Chest x-ray shows reaccumulation the right pleural effusion no underlying air bronchograms mild atelectasis normal heart size no pneumothorax pigtail catheter is in place over the right upper quadrant.   She was seen by Dr Hulen Skains and admitted with sepsis and most likely C difficile colitis.  We ask GI to see and evaluate for possible drain failure and stent issues.  She was found to have C difficile colitis and seen by Dr. Baxter Flattery of ID.  She was started on Cefepime in the ED and this was later discontinued 10/24, after she was found to have C diff colitis.  She was started on oral Vancomycin.  She was seen later in the PM by CCM with ongoing tachycardia and tachypnea.After some additional hydration she improved.  She had has had issues with weakness and deconditioning. She has been receiving physical therapy and occupational therapy for this. She's had a continuous sore throat which she attributed to reflux. This is been treated with PPI, she was found to have some oral candidiasis which is improved with Magic mouthwash and lidocaine.  Her PO intake is improved. She has had issues with fluid overload, she is up 15 pounds on her weight since admission. We are restarting her Lasix. She had a recurrent right pleural effusion again, and has had a right thoracentesis again on 01/07/16. 1 L of fluid was removed at this point.  Post procedure and CT scan showed: consolidation consistent with pneumonia throughout the right upper lobe as well as in portions of the posterior segment of the right upper lobe.   She was complaining of some night sweats, and after CT scan was started on broad spectrum antibiotics for her pneumonia. Treatment for this was completed on 01/12/16.  She was maintained on oral  Vancomycin and treatment with oral vancomycin was  extended thru 01/28/16 as directed by [facility] Medicine service.  Repeat CXR on 01/13/16 shows: COPD changes with persistent RIGHT pleural effusion and basilar atelectasis. This is stable and no further intervention required at this time .    She had increased and ongoing drainage from her Biloma drains; averaging between 120 and 600 mL of fluid from her biloma drains. A HIDA scan was repeated on 01/08/16 and it was negative for a leak.  Since that time her drainage has decreased and has been green in color.  Today she is finally doing well, off all antibiotics except for oral Vancomycin.  Ambulating safely, OT and PT have discharged her.   She is still a little SOB with walking the entire hall, but saturations are good on room air.    At this point she was ready for discharge home.  Follow up with Dr. Barry Dienes this Friday, as noted below.  Ongoing follow up with Family Medicine.    CBC Latest Ref Rng & Units 01/12/2016 01/11/2016 01/10/2016  WBC 4.0 - 10.5 K/uL 12.3(H) 13.8(H) 14.8(H)  Hemoglobin 12.0 - 15.0 g/dL 9.0(L) 9.7(L) 10.3(L)  Hematocrit 36.0 - 46.0 % 28.6(L) 31.9(L) 33.0(L)  Platelets 150 - 400 K/uL 250 330 413(H)   CMP Latest Ref Rng & Units 01/12/2016 01/10/2016 01/09/2016  Glucose 65 - 99 mg/dL 115(H) 113(H) 97  BUN 6 - 20 mg/dL 8 7 6   Creatinine 0.44 - 1.00 mg/dL  0.87 1.05(H) 0.82  Sodium 135 - 145 mmol/L 132(L) 137 140  Potassium 3.5 - 5.1 mmol/L 3.6 3.6 3.8  Chloride 101 - 111 mmol/L 107 105 109  CO2 22 - 32 mmol/L 22 24 23   Calcium 8.9 - 10.3 mg/dL 8.1(L) 8.0(L) 7.9(L)  Total Protein 6.5 - 8.1 g/dL - 5.3(L) 4.8(L)  Total Bilirubin 0.3 - 1.2 mg/dL - 0.4 0.6  Alkaline Phos 38 - 126 U/L - 160(H) 146(H)  AST 15 - 41 U/L - 26 43(H)  ALT 14 - 54 U/L - 34 47   CXR 01/13/16:  Normal heart size, mediastinal contours, and pulmonary vascularity.  Stable small moderate RIGHT pleural effusion with basilar atelectasis.  Underlying emphysematous  and bronchitic changes. Scarring at lateral mid to lower LEFT lung unchanged.  No acute infiltrate or pneumothorax. Bones demineralized.  Pigtail drains RIGHT upper quadrant unchanged.   HIDA  01/08/16:  Patent biliary tree post cholecystectomy.  No scintigraphic evidence of bile leak or CBD obstruction.  CT chest 01/07/16:  Fairly small right pleural effusion with minimal left pleural effusion. Extensive airspace consolidation consistent with pneumonia throughout the right upper lobe as well as in portions of the posterior segment of the right upper lobe. There is mild left base atelectasis.  Wall thickening and fluid in the distal half of the esophagus.  Suspect chronic reflux esophagitis.  No adenopathy. Multiple foci of atherosclerotic calcification. Note that there is calcification in multiple coronary arteries. Incomplete visualization of drainage catheter in the anterior right upper quadrant. Air in the biliary ductal system may be due to prior sphincterotomy. Note that upper abdominal visualization is limited.   Condition on D/C: Improved   Disposition: 01-Home or Self Care     Medication List    STOP taking these medications   atorvastatin 40 MG tablet Commonly known as:  LIPITOR   sodium chloride 0.9 % injection     TAKE these medications   acetaminophen 325 MG tablet Commonly known as:  TYLENOL Take 2 tablets (650 mg total) by mouth every 6 (six) hours as needed for mild pain (or temp > 100).   clopidogrel 75 MG tablet Commonly known as:  PLAVIX TAKE 1 TABLET BY MOUTH EVERY DAY   doxepin 100 MG capsule Commonly known as:  SINEQUAN Take 2 capsules (200 mg total) by mouth at bedtime.   esomeprazole 20 MG capsule Commonly known as:  NEXIUM Take 20 mg by mouth daily.   furosemide 20 MG tablet Commonly known as:  LASIX Take 20 mg by mouth daily.   gabapentin 600 MG tablet Commonly known as:  NEURONTIN Take 1 tablet (600 mg total) by mouth 2 (two) times  daily.   lidocaine 5 % Commonly known as:  LIDODERM Place 2 patches onto the skin daily. Remove & Discard patch within 12 hours or as directed by MD   meclizine 25 MG tablet Commonly known as:  ANTIVERT Take 1 tablet (25 mg total) by mouth 2 (two) times daily as needed for dizziness.   ondansetron 4 MG disintegrating tablet Commonly known as:  ZOFRAN-ODT Take 1 tablet (4 mg total) by mouth every 6 (six) hours as needed for nausea or vomiting.   oxyCODONE 5 MG immediate release tablet Commonly known as:  Oxy IR/ROXICODONE Take 1-2 tablets (5-10 mg total) by mouth every 4 (four) hours as needed for moderate pain.   ramipril 2.5 MG capsule Commonly known as:  ALTACE Do not take this till you go back to the Clinic  and they ask you to restart it. What changed:  See the new instructions.   saccharomyces boulardii 250 MG capsule Commonly known as:  FLORASTOR You can buy over-the-counter, and take daily for the next 30 days.   sertraline 50 MG tablet Commonly known as:  ZOLOFT TAKE 1 TABLET (50 MG TOTAL) BY MOUTH 2 (TWO) TIMES DAILY.   tiZANidine 2 MG tablet Commonly known as:  ZANAFLEX Take 1 tablet (2 mg total) by mouth 3 (three) times daily. What changed:  how much to take  when to take this  additional instructions   topiramate 100 MG tablet Commonly known as:  TOPAMAX TAKE 1 TABLET TWICE A DAY What changed:  See the new instructions.   vancomycin 50 mg/mL oral solution Commonly known as:  VANCOCIN Take 2.5 mLs (125 mg total) by mouth every 6 (six) hours.      Follow-up Information    BYERLY,FAERA, MD Follow up on 01/18/2016.   Specialty:  General Surgery Why:  Your appointment is at 9:45 AM Record your drainage from the JP drains each day.  Record the volume and color of drainage Contact information: [address] [place] [address] [phone]        Georges Lynch, MD Follow up.   Specialty:  Family Medicine Why:  Call for follow up in 1  week, let the recheck you blood pressure and your blood pressure medicines.  Also decide on restarting atorvastatin (Lipitor) Contact information: [address] [address] Harris, MD Follow up.   Specialty:  Gastroenterology Why:  Call for follow up appointment if you do not have one for your stent placement.   Contact information: [address] [place] [address] [phone]           Signed: Earnstine Regal 01/14/2016, 2:44 PM

## 2016-01-07 NOTE — Progress Notes (Signed)
Occupational Therapy Treatment Patient Details Name: [name] MRN: [id number] DOB: 07-08-1950 Today's Date: 01/07/2016    History of present illness [name] is a 65 yo female with PMHx of HTN, HLD, anxiety, depression, and cholelithiasis s/p laparoscopic cholecystectomy with IOC on 12/06/15 who was admitted recently from 10/2 to 10/18 with a large biloma s/p drain placement and right sided pleural effusion s/p thoracentesis who presented on 10/23 with recurrent fevers, shortness of breath, diarrhea and abdominal pain 2/2 C. Difficile Colitis.    OT comments  Pt progressing with OT sessions, but remains limited by fatigue and decreased activity tolerance. Pt able to tolerate approximately 5 minutes of standing ADL tasks this date without rest breaks. Educated pt and husband on energy conservation techniques, bed mobility techniques, and activity tolerance improvements noted. Pt would continue to benefit from OT services while admitted to improve activity tolerance and B UE strength as a precursor to improved ADL independence. OT will continue to follow acutely. Updated D/C plan for increased assistance from family. Feel pt will progress to be able to D/C home with 24 hour supervision/assistance from family as activity tolerance improves.    Follow Up Recommendations  No OT follow up;Supervision/Assistance - 24 hour    Equipment Recommendations  Tub/shower seat       Precautions / Restrictions Precautions Precautions: Fall Precaution Comments: drains Restrictions Weight Bearing Restrictions: No       Mobility Bed Mobility Overal bed mobility: Needs Assistance Bed Mobility: Sidelying to Sit;Sit to Sidelying;Supine to Sit;Sit to Supine   Sidelying to sit: Supervision Supine to sit: Supervision Sit to supine: Supervision Sit to sidelying: Supervision General bed mobility comments: Pt requiring increased time due to fatigue.  Transfers Overall transfer level: Needs  assistance Equipment used: Rolling walker (2 wheeled) Transfers: Sit to/from Stand Sit to Stand: Supervision              Balance Overall balance assessment: Needs assistance Sitting-balance support: No upper extremity supported;Feet supported Sitting balance-Leahy Scale: Good     Standing balance support: During functional activity;Single extremity supported;No upper extremity supported Standing balance-Leahy Scale: Fair Standing balance comment: Able to stand without B UE support briefly while completing standing ADL.                   ADL Overall ADL's : Needs assistance/impaired     Grooming: Supervision/safety;Standing;Wash/dry Lobbyist Transfer: Supervision/safety;Ambulation;BSC;RW Toilet Transfer Details (indicate cue type and reason): Close supervision for safety while ambulating. Pt fatigues quickly.         Functional mobility during ADLs: Supervision/safety;Rolling walker General ADL Comments: Continued education on energy conservation techniques. Pt willing to work with therapy but fatigues quickly. Husband quick to point out fatigue quickly verbalizing that "she thinks she can do these things but she really can't because you have to be there with her so she won't fall over."                Cognition   Behavior During Therapy: [facility] for tasks assessed/performed Overall Cognitive Status: Within Functional Limits for tasks assessed  Pertinent Vitals/ Pain       Pain Assessment: No/denies pain                                                          Frequency  Min 2X/week        Progress Toward Goals  OT Goals(current goals can now be found in the care plan section)  Progress towards OT goals: Progressing toward goals  Acute Rehab OT Goals Patient Stated Goal: get better OT Goal Formulation: With  patient Time For Goal Achievement: 01/18/16 Potential to Achieve Goals: Good ADL Goals Pt Will Transfer to Toilet: with modified independence;ambulating;regular height toilet Pt Will Perform Toileting - Clothing Manipulation and hygiene: with modified independence;sit to/from stand Pt Will Perform Tub/Shower Transfer: with modified independence;ambulating;shower seat;Tub transfer Pt/caregiver will Perform Home Exercise Program: Increased strength;Both right and left upper extremity;With written HEP provided;Independently;With theraband Additional ADL Goal #1: Pt will independently implement 3 energy conservation strategies into ADL session.  Plan Discharge plan needs to be updated       End of Session Equipment Utilized During Treatment: Gait belt;Rolling walker   Activity Tolerance Patient limited by fatigue   Patient Left in chair;with call bell/phone within reach   Nurse Communication Other (comment) (Pt limited by fatigue)        Time: [id number] OT Time Calculation (min): 18 min  Charges: OT General Charges $OT Visit: 1 Procedure OT Treatments $Self Care/Home Management : 8-22 mins  Norman Herrlich, OTR/L [phone] 01/07/2016, 3:29 PM

## 2016-01-07 NOTE — Progress Notes (Signed)
Pharmacy Antibiotic Note  [name] is a 65 y.o. female admitted on 12/31/2015 with weakness and diarrhea.  Pharmacy has been consulted for Vancomycin and Cefepime dosing for pneumonia coverage.  Previously on Vanc and Cefepime 10/23-10/25.   Recurrent pleural effusion, s/p thoracentesis today, 1 liter removed, no organisms on gram stain.  Tmasx 99.4, WBC 8.0, on room air.  For CXR in am.   Day # 8 of 14 Vancomycin PO for + C diff.   Hx recent hospitalization (10/3-10/20) with sepsis d/t bile leak and biloma. Strep viridans bacteremia - treated with Vanc/Zosyn then Ceftriaxone and Flagyl.    Plan:  Cefepime 2gm IV q12hrs.  Vancomycin 1gm IV q12hrs.  Target vanc trough levels 15-20 mcg/ml.  Follow renal function, culture data, antibiotic plans.  Hope to limit IV antibiotics while treating for C diff.  Prior plan for possible discharge in am.  Height: 5\' 7"  (170.2 cm) Weight: 171 lb 8.3 oz (77.8 kg) IBW/kg (Calculated) : 61.6  Temp (24hrs), Avg:98.5 F (36.9 C), Min:97.7 F (36.5 C), Max:99.4 F (37.4 C)   Recent Labs Lab 01/01/16 0430 01/01/16 1400 01/01/16 1507 01/02/16 2153 01/03/16 0415 01/05/16 0500 01/07/16 0305 01/07/16 1100  WBC  --  9.5  --  6.4  --  8.8 8.0  --   CREATININE 0.79  --  0.80 0.65 0.71 0.63  --  0.79  LATICACIDVEN 0.7  --   --   --   --   --   --   --     Estimated Creatinine Clearance: 76.4 mL/min (by C-G formula based on SCr of 0.79 mg/dL).    Allergies  Allergen Reactions  . Shellfish Allergy Anaphylaxis and Other (See Comments)    All seafood    Antimicrobials this admission: Cefepime 10/23>>10/25, resume 10/30>> Vanc 10/23>>10/25; resume 10/30>> PO vanc 10/23 >> (11/6)  Dose adjustments this admission:  n/a  Microbiology results: 10/30 right pleural fluid - no organisms on gram stain 10/30 right pleura fluid cx - sent 10/29 C diff (-) 10/23 C. Diff (+) antigen, (-) toxin-> (+) PCR 10/23 blood x 2 - negative 10/23 urine -  negative  Thank you for allowing pharmacy to be a part of this patient's care.  Arty Baumgartner, [place] Pager: [phone] 01/07/2016 10:18 PM

## 2016-01-07 NOTE — Progress Notes (Addendum)
CXR shows recurrent right pleural effusion Will need thoracentesis prior to discharge Will notify Dr. Georgette Dover, who will be managing her this week as the DOW.  Edsel Petrin. Dalbert Batman, M.D., [facility] General and Minimally invasive Surgery Breast and Colorectal Surgery Office:   [phone] Pager:   [phone]

## 2016-01-07 NOTE — Progress Notes (Signed)
Family Medicine Teaching Service Consult Note Intern Pager: [phone]  Patient name: [name] Medical record number: [id number] Date of birth: 08-Jul-1950 Age: 65 y.o. Gender: female  Primary Care Provider: Georges Lynch, MD Consultants: Gastroenterology, Surgery Code Status: Full   Pt Overview and Major Events to Date:  9/28: Laproscopic Cholecystectyomy 10/3-10/20: Biliary stent, R LQ drain, thoracentesis  Assessment and Plan: [name] is a 65 y/o F with a past medical history significant HLD, anxiety, depression, CKD stage III, migraines, and CVA s/p laparoscopic cholecysectomy who presented with fever, weakness and diarrhea secondary to C.difficile infection.  #Sepsis, resolved --Will continue to monitor vitals.  #Diarrhea, improving C.diff antigen and toxin on 10/29 was negative. Patient will be getting vancomycin compounded by our pharmacy.   --Continue vancomycin 125 mg q6 most likely for 14 days. DAY 8 --Continue Enteric precautions --Started Probiotics yesterday (10/27)  #Pleural effusion, subacute,ongoing Patient has had 2 thoracentesis during previous admission. Pleural fluid concerning for infectious process with WBCs and PMNs, but no growth with culture. Patient has been short of breath and coughing. CXR from 10/29 showed moderate pleural effusion larger from previous imaging --Consulted pulmonology, further workup of effusion needed --Assess need for thoracentesis prior to discharge  #Post-thoracotomy pain syndrome --Continue Neurontin 600 mg bid --Oxycodone 5-325 Q4 prn  #HTN, stable  This morning BP 130/78 --Continue to hold home Ramipril 2.5 mg daily --Will restart   #History of CVA in 2015, stable With reported residual right sided weakness of right foot and right hand. Was started on Plavix.  --Hold Plavix for now given risk of bleeding  #LE swelling.  With no formal diagnosis of CHF. Last Echo 12/2015 with EF 55-60%.  At home on 20 mg  Lasix daily. Patient with some LE extremities edema yesterday (10/29) and given lasix. Patient still has mild edema this morning.  --Reassess fluid status  #Hypokalemia, resolved  K+ 3.6 (10/28) --Will continue to replete as needed   --Follow BMET in AM.   #Hyperlipidemia:  Last lipid panel: Chol 145, TG 49, HDL 67, LDL 68. --Continue home Lipitor 40 mg daily  #CKD Stage III.   Cr on admission 0.91. Has improved throughout stay 0.63 (10/28). --Avoid nephrotoxic agents --Renally dose medications  #Migraines --Continue home Topamax 100 mg bid  #Anxiety.   Reports also trouble sleeping.  --Continue home Doxepin 200 mg daily at bedtime  #Depression --Continue home Zoloft 50 mg bid  FEN/GI: regular diet PPx: SCDs, claritin   Disposition: Progressing but will need a few more days for resolution of diarrhea and reconditioning after prolong hospital stay   Subjective:  Patient feeling better this morning. Still reporting mild cough and lower extremity edema. She is ready for discharge, but wants to make sure that she will not come back.  Objective: Temp:  [97.7 F (36.5 C)-98.8 F (37.1 C)] 97.7 F (36.5 C) (10/30 0505) Pulse Rate:  [96-115] 96 (10/30 0505) Resp:  [16] 16 (10/30 0505) BP: (130-149)/(72-90) 130/76 (10/30 0505) SpO2:  [96 %-99 %] 98 % (10/30 0505) Weight:  [171 lb 8.3 oz (77.8 kg)] 171 lb 8.3 oz (77.8 kg) (10/30 0500)  Physical Exam: General: [name], female lying in hospital bed, in NAD HEENT: EOMI, MMM Cardiovascular: RRR, no MRG, 2+ pulses  Respiratory: CTA B/L no wheezing noted Abdomen: obese, soft, NT ND, biliary drain in place covered with dry bandage,  Extremities: warm, well perfused, no edema or tenderness, moves all extremities spontaneously Neuro: [name]  Laboratory:  Recent Labs Lab 01/02/16  2153 01/05/16 0500 01/07/16 0305  WBC 6.4 8.8 8.0  HGB 9.6* 9.1* 8.6*  HCT 29.1* 28.4* 27.8*  PLT 278 337 341    Recent Labs Lab  12/31/15 0950  01/01/16 1407  01/02/16 2153 01/03/16 0415 01/05/16 0500  NA 135  < >  --   < > 136 138 140  K 3.3*  < >  --   < > 3.5 4.5 3.6  CL 102  < >  --   < > 113* 115* 114*  CO2 22  < >  --   < > 19* 18* 21*  BUN 12  < >  --   < > <5* <5* <5*  CREATININE 1.17*  < >  --   < > 0.65 0.71 0.63  CALCIUM 8.1*  < >  --   < > 7.5* 7.4* 7.7*  PROT 6.3*  --  4.4*  --   --   --   --   BILITOT 0.7  --   --   --   --   --   --   ALKPHOS 196*  --   --   --   --   --   --   ALT 29  --   --   --   --   --   --   AST 45*  --   --   --   --   --   --   GLUCOSE 137*  < >  --   < > 111* 92 101*  < > = values in this interval not displayed.  Imaging/Diagnostic Tests: Dg Chest 2 View  Result Date: 01/06/2016 CLINICAL DATA:  Assess bilateral pleural effusions. Right-sided thoracentesis October 2019. EXAM: CHEST  2 VIEW COMPARISON:  December 31, 2015 FINDINGS: The right central line terminates in the central SVC. No pneumothorax. A moderate right-sided pleural effusion is identified, larger in the interval. A tiny left effusion is identified. Pigtail catheters project over the right upper quadrant of the abdomen. No other interval changes or acute abnormalities. IMPRESSION: Moderate right pleural effusion, larger in the interval. Electronically Signed   By: Dorise Bullion III M.D   On: 01/06/2016 11:06    Marjie Skiff, MD 01/07/2016, 9:09 AM PGY-1, [facility] Intern pager: [phone], text pages welcome

## 2016-01-08 ENCOUNTER — Inpatient Hospital Stay (HOSPITAL_COMMUNITY): Payer: BLUE CROSS/BLUE SHIELD

## 2016-01-08 LAB — COMPREHENSIVE METABOLIC PANEL
ALT: 57 U/L — ABNORMAL HIGH (ref 14–54)
AST: 72 U/L — ABNORMAL HIGH (ref 15–41)
Albumin: 2 g/dL — ABNORMAL LOW (ref 3.5–5.0)
Alkaline Phosphatase: 185 U/L — ABNORMAL HIGH (ref 38–126)
Anion gap: 6 (ref 5–15)
BUN: 5 mg/dL — ABNORMAL LOW (ref 6–20)
CO2: 25 mmol/L (ref 22–32)
Calcium: 7.9 mg/dL — ABNORMAL LOW (ref 8.9–10.3)
Chloride: 107 mmol/L (ref 101–111)
Creatinine, Ser: 0.81 mg/dL (ref 0.44–1.00)
GFR calc Af Amer: >60 mL/min (ref >60)
GFR calc non Af Amer: >60 mL/min (ref >60)
Glucose, Bld: 100 mg/dL — ABNORMAL HIGH (ref 65–99)
Potassium: 3.9 mmol/L (ref 3.5–5.1)
Sodium: 138 mmol/L (ref 135–145)
Total Bilirubin: 0.9 mg/dL (ref 0.3–1.2)
Total Protein: 4.6 g/dL — ABNORMAL LOW (ref 6.5–8.1)

## 2016-01-08 LAB — CBC
HCT: 29.4 % — ABNORMAL LOW (ref 36.0–46.0)
Hemoglobin: 9.3 g/dL — ABNORMAL LOW (ref 12.0–15.0)
MCH: 28.2 pg (ref 26.0–34.0)
MCHC: 31.6 g/dL (ref 30.0–36.0)
MCV: 89.1 fL (ref 78.0–100.0)
Platelets: 378 10*3/uL (ref 150–400)
RBC: 3.3 MIL/uL — ABNORMAL LOW (ref 3.87–5.11)
RDW: 16.5 % — ABNORMAL HIGH (ref 11.5–15.5)
WBC: 11.2 10*3/uL — ABNORMAL HIGH (ref 4.0–10.5)

## 2016-01-08 LAB — PH, BODY FLUID: pH, Body Fluid: 7.7

## 2016-01-08 MED ORDER — TECHNETIUM TC 99M MEBROFENIN IV KIT
5.0000 | PACK | Freq: Once | INTRAVENOUS | Status: AC | PRN
  Administered 2016-01-08: 5 via INTRAVENOUS

## 2016-01-08 NOTE — Progress Notes (Signed)
Physical Therapy Treatment & Discharge Patient Details Name: [name] MRN: [id number] DOB: 1950/10/27 Today's Date: 01/08/2016    History of Present Illness [name] is a 65 yo female with PMHx of HTN, HLD, anxiety, depression, and cholelithiasis s/p laparoscopic cholecystectomy with IOC on 12/06/15 who was admitted recently from 10/2 to 10/18 with a large biloma s/p drain placement and right sided pleural effusion s/p thoracentesis who presented on 10/23 with recurrent fevers, shortness of breath, diarrhea and abdominal pain 2/2 C. Difficile Colitis.     PT Comments    Pt has met PT goals of modified Independence with mobility. Pt  Is able to safely ambulate on the unit with or without a RW. Pt was able to tolerate minor perturbations to balance without difficulty. Pt c/o some increased fatigue with activity, but was able to independently decide when she needed a rest break. Pt educated on standing LE exercises and demonstrated good understanding. Pt is cleared to ambulate in the room or hall without assistance until d/c home. Pt was told she has a new diagnosis of pneumonia and her d/c may be delayed. Pt agreeable to ambulate and perform HEP for improved endurance and strength. Pt has met PT goals for mobility and is ready for d/c home when medically cleared. Pt is d/c from acute PT services and no follow-up PT is indicated at this time.  Follow Up Recommendations  No PT follow up     Equipment Recommendations  None recommended by PT    Recommendations for Other Services       Precautions / Restrictions Precautions Precautions: Other (comment) (lines) Restrictions Weight Bearing Restrictions: No    Mobility  Bed Mobility Overal bed mobility: Modified Independent Bed Mobility: Supine to Sit Rolling: Modified independent (Device/Increase time)   Supine to sit: Modified independent (Device/Increase time)        Transfers Overall transfer level: Modified  independent Equipment used: Rolling walker (2 wheeled);None Transfers: Sit to/from Stand Sit to Stand: Modified independent (Device/Increase time)            Ambulation/Gait Ambulation/Gait assistance: Modified independent (Device/Increase time);Independent Ambulation Distance (Feet): 300 Feet Assistive device: Rolling walker (2 wheeled);None Gait Pattern/deviations: Step-through pattern   Gait velocity interpretation: Below normal speed for age/gender General Gait Details: ambulated with RW and without RW. Pt was Independent without any assistvie device on the unit. Pt was able to maintain balance with functional gait on the unit. Pt verbalized some increased fatigue and SOB after gait.    Stairs            Wheelchair Mobility    Modified Rankin (Stroke Patients Only)       Balance Overall balance assessment: Modified Independent Sitting-balance support: No upper extremity supported Sitting balance-Leahy Scale: Normal     Standing balance support: No upper extremity supported Standing balance-Leahy Scale: Good                      Cognition Arousal/Alertness: Awake/alert Behavior During Therapy: WFL for tasks assessed/performed Overall Cognitive Status: Within Functional Limits for tasks assessed                      Exercises Total Joint Exercises Standing Hip Extension: AROM;Strengthening;Both;20 reps;Standing General Exercises - Lower Extremity Hip Flexion/Marching: AROM;Strengthening;Both;20 reps;Standing Heel Raises: AROM;Strengthening;Both;20 reps;Standing Mini-Sqauts: AROM;Strengthening;Both;20 reps;Standing    General Comments General comments (skin integrity, edema, etc.): Reviewed standing LE exercies to be done Independently standing in front of the  counter.      Pertinent Vitals/Pain Pain Assessment: No/denies pain    Home Living                      Prior Function            PT Goals (current goals can now  be found in the care plan section) Progress towards PT goals: Goals met/education completed, patient discharged from PT    Frequency           PT Plan Current plan remains appropriate    Co-evaluation             End of Session Equipment Utilized During Treatment: Gait belt Activity Tolerance: Patient tolerated treatment well Patient left: in chair;with call bell/phone within reach     Time: 1031-1059 PT Time Calculation (min) (ACUTE ONLY): 28 min  Charges:  $Gait Training: 8-22 mins $Therapeutic Exercise: 8-22 mins                    G Codes:      Lelon Mast 01/08/2016, 11:14 AM

## 2016-01-08 NOTE — Progress Notes (Signed)
Family Medicine Teaching Service Consult Note Intern Pager: [phone]  Patient name: [name] Medical record number: [id number] Date of birth: 17-May-1950 Age: 65 y.o. Gender: female  Primary Care Provider: Georges Lynch, MD Consultants: Gastroenterology, Surgery Code Status: Full   Pt Overview and Major Events to Date:  9/28: Laproscopic Cholecystectyomy 10/3-10/20: Biliary stent, R LQ drain, thoracentesis  Assessment and Plan: [name] is a 65 y/o F with a past medical history significant HLD, anxiety, depression, CKD stage III, migraines, and CVA s/p laparoscopic cholecysectomy who presented with fever, weakness and diarrhea secondary to C.difficile infection.  #Sepsis, resolved --Will continue to monitor vitals.  #Diarrhea, improving C.diff antigen and toxin on 10/29 was negative. Patient will be getting vancomycin compounded by our pharmacy.   --Continue vancomycin 125 mg q6 most likely for 14 days. DAY 9 --Continue Enteric precautions --Started Probiotics yesterday (10/27)  #Pleural effusion, subacute,ongoing Patient had a thoracentesis yesterday (10/30) and drained 1L of pleural fluid. Subsequently chest CT showed Right upper and lower lobe pneumonia. Patient was started on antibiotics --Continue Vancomycin 1000 mg bid iv --Continue Cefepime 2g bid iv --Will follow up on pleural analysis.  #Post-thoracotomy pain syndrome --Continue Neurontin 600 mg bid --Oxycodone 5-325 Q4 prn  #HTN, stable  This morning BP 130/78 --Continue to hold home Ramipril 2.5 mg daily --Will restart   #History of CVA in 2015, stable With reported residual right sided weakness of right foot and right hand. Was started on Plavix.  --Hold Plavix for now given risk of bleeding  #LE swelling.  With no formal diagnosis of CHF. Last Echo 12/2015 with EF 55-60%.  At home on 20 mg Lasix daily. Patient with some LE extremities edema yesterday (10/29) and given lasix. Patient still has  mild edema this morning. --Reassess fluid status --Continue lasix 20 mg po bid  #Hypokalemia, resolved  K+ 3.6 (10/28) --Will continue to replete as needed   --Follow BMET in AM.   #Hyperlipidemia:  Last lipid panel: Chol 145, TG 49, HDL 67, LDL 68. --Continue home Lipitor 40 mg daily  #CKD Stage III.   Cr on admission 0.91. Has improved throughout stay 0.63 (10/28). --Avoid nephrotoxic agents --Renally dose medications  #Migraines --Continue home Topamax 100 mg bid  #Anxiety.   Reports also trouble sleeping.  --Continue home Doxepin 200 mg daily at bedtime  #Depression --Continue home Zoloft 50 mg bid  FEN/GI: regular diet PPx: SCDs, claritin   Disposition: Progressing but will need a few more days for resolution of pneumonia and reconditioning after prolong hospital stay   Subjective:  Patient felt feverish overnight, and endorses some chills. Breathing and cough have improved after thoracentesis. Patient diarrhea has almost completely resolved and she is tolerating po.  Objective: Temp:  [97.8 F (36.6 C)-99.4 F (37.4 C)] 98.6 F (37 C) (10/31 0522) Pulse Rate:  [102-121] 121 (10/31 0834) Resp:  [16] 16 (10/30 1306) BP: (122-133)/(75-87) 122/87 (10/31 0522) SpO2:  [93 %-98 %] 98 % (10/31 0522) Weight:  [164 lb 7.4 oz (74.6 kg)] 164 lb 7.4 oz (74.6 kg) (10/31 0500)  Physical Exam: General: [name], female lying in hospital bed, in NAD HEENT: EOMI, MMM Cardiovascular: RRR, no MRG, 2+ pulses  Respiratory: CTA B/L no wheezing noted Abdomen: obese, soft, NT ND, biliary drain in place covered with dry bandage,  Extremities: warm, well perfused, no edema or tenderness, moves all extremities spontaneously Neuro: [name]  Laboratory:  Recent Labs Lab 01/05/16 0500 01/07/16 0305 01/08/16 0405  WBC 8.8 8.0 11.2*  HGB 9.1* 8.6* 9.3*  HCT 28.4* 27.8* 29.4*  PLT 337 341 378    Recent Labs Lab 01/01/16 1407  01/05/16 0500 01/07/16 1100 01/08/16 0405   NA  --   < > 140 139 138  K  --   < > 3.6 3.2* 3.9  CL  --   < > 114* 110 107  CO2  --   < > 21* 20* 25  BUN  --   < > <5* 6 5*  CREATININE  --   < > 0.63 0.79 0.81  CALCIUM  --   < > 7.7* 8.3* 7.9*  PROT 4.4*  --   --   --  4.6*  BILITOT  --   --   --   --  0.9  ALKPHOS  --   --   --   --  185*  ALT  --   --   --   --  57*  AST  --   --   --   --  72*  GLUCOSE  --   < > 101* 147* 100*  < > = values in this interval not displayed.  Imaging/Diagnostic Tests: Dg Chest 1 View  Result Date: 01/07/2016 CLINICAL DATA:  Status post thoracentesis EXAM: CHEST 1 VIEW COMPARISON:  None. FINDINGS: Trace bilateral pleural effusions. No pneumothorax. Mild right lower lung atelectasis. No consolidation. Cardiomediastinal silhouette right tip projecting the DC no acute osseous abnormality. IMPRESSION: 1. No right pneumothorax status post thoracentesis. Trace bilateral pleural effusions Electronically Signed   By: Kathreen Devoid   On: 01/07/2016 13:05   Dg Chest 2 View  Result Date: 01/08/2016 CLINICAL DATA:  Chest pain.  Shortness of breath . EXAM: CHEST  2 VIEW COMPARISON:  CT 01/07/2016.  Chest x-ray 01/07/2016 . FINDINGS: Right IJ line noted in stable position. Right upper quadrant drainage catheters in stable position. Mediastinum hilar structures are normal. Mild right base infiltrate with small right pleural effusion. Bilateral pleural parenchymal thickening again noted consistent scarring. Prior cervical spine fusion . IMPRESSION: 1. Right IJ line in stable position . Right upper quadrant drainage catheters in stable position. 2. Mild right base infiltrate consistent with pneumonia. Bilateral pleural-parenchymal thickening again noted consistent with scarring. Electronically Signed   By: Marcello Moores  Register   On: 01/08/2016 08:00   Dg Chest 2 View  Result Date: 01/06/2016 CLINICAL DATA:  Assess bilateral pleural effusions. Right-sided thoracentesis October 2019. EXAM: CHEST  2 VIEW COMPARISON:   December 31, 2015 FINDINGS: The right central line terminates in the central SVC. No pneumothorax. A moderate right-sided pleural effusion is identified, larger in the interval. A tiny left effusion is identified. Pigtail catheters project over the right upper quadrant of the abdomen. No other interval changes or acute abnormalities. IMPRESSION: Moderate right pleural effusion, larger in the interval. Electronically Signed   By: Dorise Bullion III M.D   On: 01/06/2016 11:06   Ct Chest Wo Contrast  Result Date: 01/07/2016 CLINICAL DATA:  Several recent pleural effusions. Renal insufficiency EXAM: CT CHEST WITHOUT CONTRAST TECHNIQUE: Multidetector CT imaging of the chest was performed following the standard protocol without IV contrast. COMPARISON:  Chest radiograph January 07, 2016 ; CT abdomen and pelvis December 26, 2015 FINDINGS: Cardiovascular: There is no appreciable thoracic aortic aneurysm or dissection. In the visualized great vessels, there is moderate calcification in the proximal left subclavian and right common carotid arteries. There is approximately 50% diameter stenosis at the origin of the left subclavian artery.  There are multiple foci of coronary artery calcification. The pericardium is not appreciably thickened. Mediastinum/Nodes: Thyroid appears unremarkable. There are multiple subcentimeter mediastinal lymph nodes without frank adenopathy by size criteria. There is a small hiatal hernia with fluid in the distal half of the esophagus, likely due to chronic reflux change. There is also thickening of the wall of the distal half of the esophagus. Lungs/Pleura: There is a fairly small free-flowing pleural effusion on the right with minimal free-flowing pleural effusion on the left. There is airspace consolidation throughout much of the right lower lobe as well as in portions of the posterior segment of the right upper lobe. There is mild atelectatic change in the anterior lateral left base  regions. Upper Abdomen: In the visualized upper abdomen, there is incomplete visualization of a drainage catheter adjacent to the liver edge on the right anteriorly, unchanged. A small amount of air is noted in the biliary ductal system which may be due to prior sphincterotomy. There is incomplete visualization of a biliary stent. Visualized upper abdominal structures otherwise appear unremarkable with limited upper abdominal visualization on this examination. Musculoskeletal: There are no blastic or lytic bone lesions. IMPRESSION: Fairly small right pleural effusion with minimal left pleural effusion. Extensive airspace consolidation consistent with pneumonia throughout the right upper lobe as well as in portions of the posterior segment of the right upper lobe. There is mild left base atelectasis. Wall thickening and fluid in the distal half of the esophagus. Suspect chronic reflux esophagitis. No adenopathy. Multiple foci of atherosclerotic calcification. Note that there is calcification in multiple coronary arteries. Incomplete visualization of drainage catheter in the anterior right upper quadrant. Air in the biliary ductal system may be due to prior sphincterotomy. Note that upper abdominal visualization is limited. Electronically Signed   By: Lowella Grip III M.D.   On: 01/07/2016 15:57   US Thoracentesis Asp Pleural Space W/img Guide  Result Date: 01/07/2016 INDICATION: Shortness of breath. Recurrent right pleural effusion. Request diagnostic and therapeutic thoracentesis. EXAM: ULTRASOUND GUIDED RIGHT THORACENTESIS MEDICATIONS: None. COMPLICATIONS: None immediate. PROCEDURE: An ultrasound guided thoracentesis was thoroughly discussed with the patient and questions answered. The benefits, risks, alternatives and complications were also discussed. The patient understands and wishes to proceed with the procedure. Written consent was obtained. Ultrasound was performed to localize and mark an adequate  pocket of fluid in the right chest. The area was then prepped and draped in the normal sterile fashion. 1% Lidocaine was used for local anesthesia. Under ultrasound guidance a Safe-T-Centesis catheter was introduced. Thoracentesis was performed. The catheter was removed and a dressing applied. FINDINGS: A total of approximately 1 L of hazy, yellow fluid was removed. Samples were sent to the laboratory as requested by the clinical team. IMPRESSION: Successful ultrasound guided right thoracentesis yielding 1 L of pleural fluid. Read by: Ascencion Dike PA-C Electronically Signed   By: Markus Daft M.D.   On: 01/07/2016 12:58    Marjie Skiff, MD 01/08/2016, 12:21 PM PGY-1, [place] Intern pager: [phone], text pages welcome

## 2016-01-08 NOTE — Progress Notes (Signed)
PULMONARY / CRITICAL CARE MEDICINE   Name: [name] MRN: [id number] DOB: [birth year]    ADMISSION DATE:  12/31/2015 CONSULTATION DATE:  12/31/2015, re consulted 10/30  REFERRING MD: Dr. Brantley Stage  CHIEF COMPLAINT:  Pleural effusion   HISTORY OF PRESENT ILLNESS:   [name] is a 65 y/o F with hx CKD 3, CVA 2015 with recent hospitalization 10/3-10/20 with sepsis r/t bile leak and biloma. Leak was treated with biliary stent; biloma treated with percutaneous RUQ drain.  Blood cultures that admit grew Gram pos and gram neg rods, strep viridans.  TEE was neg for endocarditis.  That hospitalization also c/b R pleural effusion s/p thoracentesis.  She returned 10/23 with weakness, fever, copious diarrhea, abd pain, SOB and fever.  CCM originally consulted on admit 10/23 for central line placement and assistance with sepsis management. Found to have CDiff and treated appropriately, followed by ID.    On 10/30 PCCM re-consulted for ongoing pleural effusion and increasing SOB.    SUBJECTIVE:  Decreased sob post thora, Underwent thoracentesis, CT scan yesterday  VITAL SIGNS: BP 122/87 (BP Location: Left Arm)   Pulse (!) 121   Temp 98.6 F (37 C) (Oral)   Resp 16   Ht 5\' 7"  (1.702 m)   Wt 164 lb 7.4 oz (74.6 kg)   SpO2 98%   BMI 25.76 kg/m    INTAKE / OUTPUT: I/O last 3 completed shifts: In: 1690 [P.O.:1440; IV Piggyback:250] Out: [id number] [Urine:3175; Drains:100]  PHYSICAL EXAMINATION: General: pleasant female, NAD , sitting in chair Neuro: AOx3 HEENT: NCAT Cardiovascular: S1, S2, RRR. Mo MRG Lungs: resps even non labored on RA, diminished R base  Abdomen: RUQ percutaneous drain and biliary tube with greenish fluid Musculoskeletal:1+ BLE edema  Skin: Flushed, warm skin.   LABS:  BMET  Recent Labs Lab 01/05/16 0500 01/07/16 1100 01/08/16 0405  NA 140 139 138  K 3.6 3.2* 3.9  CL 114* 110 107  CO2 21* 20* 25  BUN <5* 6 5*  CREATININE 0.63 0.79 0.81  GLUCOSE 101*  147* 100*    Electrolytes  Recent Labs Lab 01/05/16 0500 01/07/16 1100 01/08/16 0405  CALCIUM 7.7* 8.3* 7.9*    CBC  Recent Labs Lab 01/05/16 0500 01/07/16 0305 01/08/16 0405  WBC 8.8 8.0 11.2*  HGB 9.1* 8.6* 9.3*  HCT 28.4* 27.8* 29.4*  PLT 337 341 378    Coag's  Recent Labs Lab 01/01/16 1400  APTT 37*  INR 1.28    Sepsis Markers No results for input(s): LATICACIDVEN, PROCALCITON, O2SATVEN in the last 168 hours.  ABG No results for input(s): PHART, PCO2ART, PO2ART in the last 168 hours.  Liver Enzymes  Recent Labs Lab 01/07/16 1100 01/08/16 0405  AST  --  72*  ALT  --  57*  ALKPHOS  --  185*  BILITOT  --  0.9  ALBUMIN 2.3* 2.0*    Cardiac Enzymes No results for input(s): TROPONINI, PROBNP in the last 168 hours.  Glucose  Recent Labs Lab 01/01/16 1158 01/02/16 0739  GLUCAP 124* 92    Imaging Dg Chest 1 View  Result Date: 01/07/2016 CLINICAL DATA:  Status post thoracentesis EXAM: CHEST 1 VIEW COMPARISON:  None. FINDINGS: Trace bilateral pleural effusions. No pneumothorax. Mild right lower lung atelectasis. No consolidation. Cardiomediastinal silhouette right tip projecting the DC no acute osseous abnormality. IMPRESSION: 1. No right pneumothorax status post thoracentesis. Trace bilateral pleural effusions Electronically Signed   By: Kathreen Devoid   On: 01/07/2016 13:05  Dg Chest 2 View  Result Date: 01/08/2016 CLINICAL DATA:  Chest pain.  Shortness of breath . EXAM: CHEST  2 VIEW COMPARISON:  CT 01/07/2016.  Chest x-ray 01/07/2016 . FINDINGS: Right IJ line noted in stable position. Right upper quadrant drainage catheters in stable position. Mediastinum hilar structures are normal. Mild right base infiltrate with small right pleural effusion. Bilateral pleural parenchymal thickening again noted consistent scarring. Prior cervical spine fusion . IMPRESSION: 1. Right IJ line in stable position . Right upper quadrant drainage catheters in stable  position. 2. Mild right base infiltrate consistent with pneumonia. Bilateral pleural-parenchymal thickening again noted consistent with scarring. Electronically Signed   By: Marcello Moores  Register   On: 01/08/2016 08:00   Ct Chest Wo Contrast  Result Date: 01/07/2016 CLINICAL DATA:  Several recent pleural effusions. Renal insufficiency EXAM: CT CHEST WITHOUT CONTRAST TECHNIQUE: Multidetector CT imaging of the chest was performed following the standard protocol without IV contrast. COMPARISON:  Chest radiograph January 07, 2016 ; CT abdomen and pelvis December 26, 2015 FINDINGS: Cardiovascular: There is no appreciable thoracic aortic aneurysm or dissection. In the visualized great vessels, there is moderate calcification in the proximal left subclavian and right common carotid arteries. There is approximately 50% diameter stenosis at the origin of the left subclavian artery. There are multiple foci of coronary artery calcification. The pericardium is not appreciably thickened. Mediastinum/Nodes: Thyroid appears unremarkable. There are multiple subcentimeter mediastinal lymph nodes without frank adenopathy by size criteria. There is a small hiatal hernia with fluid in the distal half of the esophagus, likely due to chronic reflux change. There is also thickening of the wall of the distal half of the esophagus. Lungs/Pleura: There is a fairly small free-flowing pleural effusion on the right with minimal free-flowing pleural effusion on the left. There is airspace consolidation throughout much of the right lower lobe as well as in portions of the posterior segment of the right upper lobe. There is mild atelectatic change in the anterior lateral left base regions. Upper Abdomen: In the visualized upper abdomen, there is incomplete visualization of a drainage catheter adjacent to the liver edge on the right anteriorly, unchanged. A small amount of air is noted in the biliary ductal system which may be due to prior  sphincterotomy. There is incomplete visualization of a biliary stent. Visualized upper abdominal structures otherwise appear unremarkable with limited upper abdominal visualization on this examination. Musculoskeletal: There are no blastic or lytic bone lesions. IMPRESSION: Fairly small right pleural effusion with minimal left pleural effusion. Extensive airspace consolidation consistent with pneumonia throughout the right upper lobe as well as in portions of the posterior segment of the right upper lobe. There is mild left base atelectasis. Wall thickening and fluid in the distal half of the esophagus. Suspect chronic reflux esophagitis. No adenopathy. Multiple foci of atherosclerotic calcification. Note that there is calcification in multiple coronary arteries. Incomplete visualization of drainage catheter in the anterior right upper quadrant. Air in the biliary ductal system may be due to prior sphincterotomy. Note that upper abdominal visualization is limited. Electronically Signed   By: Lowella Grip III M.D.   On: 01/07/2016 15:57   US Thoracentesis Asp Pleural Space W/img Guide  Result Date: 01/07/2016 INDICATION: Shortness of breath. Recurrent right pleural effusion. Request diagnostic and therapeutic thoracentesis. EXAM: ULTRASOUND GUIDED RIGHT THORACENTESIS MEDICATIONS: None. COMPLICATIONS: None immediate. PROCEDURE: An ultrasound guided thoracentesis was thoroughly discussed with the patient and questions answered. The benefits, risks, alternatives and complications were also discussed. The patient  understands and wishes to proceed with the procedure. Written consent was obtained. Ultrasound was performed to localize and mark an adequate pocket of fluid in the right chest. The area was then prepped and draped in the normal sterile fashion. 1% Lidocaine was used for local anesthesia. Under ultrasound guidance a Safe-T-Centesis catheter was introduced. Thoracentesis was performed. The catheter was  removed and a dressing applied. FINDINGS: A total of approximately 1 L of hazy, yellow fluid was removed. Samples were sent to the laboratory as requested by the clinical team. IMPRESSION: Successful ultrasound guided right thoracentesis yielding 1 L of pleural fluid. Read by: Ascencion Dike PA-C Electronically Signed   By: Markus Daft M.D.   On: 01/07/2016 12:58    STUDIES:    CULTURES: BCx 10/2 >> Strep Viridans UCx 10/2 >> No Growth Body Fluid Cx >> No Growth BCx 10/23 >> neg UCx 10/23 >> neg Pleural Fluid Cx 10/23 >> neg  C. Diff 10/23: Ag positive, toxin negative CDiff 10/29>> neg Ag and toxin  ANTIBIOTICS: PO vanc 10/23 >>   SIGNIFICANT EVENTS: 9/28: Cholecystectomy 12/11/15 to 12/28/15: Hospitalized for sepsis 2/2 bactermia  10/19> R thora 10/23: Re-admitted with concern for sepsis 10/23 thoracentesis>> 1L off>> WBC 1388 with 53%neutophils, glucose 140 but no protein or LDH results seen 10/30 Thora > 1 Lt off. LDH 118, Protein 3, Alb 2, WBC 210  Intake/Output Summary (Last 24 hours) at 01/08/16 0944 Last data filed at 01/08/16 0857  Gross per 24 hour  Intake             1450 ml  Output             3075 ml  Net            -1625 ml   ASSESSMENT / PLAN:  Recurrent R pleural effusion - s/p thoracentesis x 2 on 10/19 and 10/23 both culture neg.   Does have diastolic dysfunction on most recent echo.    Pleural fluid studies from 10/30 reviewed. Shows exudative effusion. Suspect para pneumonic CT scan 10/30 images reviewed. Consistent with rt PNA  REC -  Continue diuresis Abx for HCAP as per IM team. Follow cultures.  PCCM will be available as needed. Please call with any questions.  Marshell Garfinkel MD [facility] Pager [phone] If no answer or after 3pm call: [phone] 01/08/2016, 11:37 AM

## 2016-01-08 NOTE — Progress Notes (Signed)
Subjective: Patient looks washed out, up in chair. Daughter is present with multiple questions about why the biloma drainage is persisting. She reports that Dr. Dalbert Batman told her that this is an abnormal case and should have been resolved at this point. I have listed the drainage recorded from her biloma drains below starting from today back to 12/31/15.Her swallowing is better with the Magic Mouth wash.  Objective: Vital signs in last 24 hours: Temp:  [97.8 F (36.6 C)-99.4 F (37.4 C)] 98.6 F (37 C) (10/31 0522) Pulse Rate:  [102-121] 121 (10/31 0834) Resp:  [16] 16 (10/30 1306) BP: (122-143)/(67-87) 122/87 (10/31 0522) SpO2:  [93 %-98 %] 98 % (10/31 0522) Weight:  [74.6 kg (164 lb 7.4 oz)] 74.6 kg (164 lb 7.4 oz) (10/31 0500) Last BM Date: 01/06/16 960 PO Urine 3175  She got lasix yesterday  Drain 50- 120 -350 -nothing recorded - 615 - 143 - 260 - 150   (drainage going from today 01/08/16, back to 12/2315) Afebrile, VSS  WBC is up today AST 72 ALT 57, both up some since 12/31/15 CXR/CT scan per medicine show after thoracentesis shows: Fairly small right pleural effusion with minimal left pleural effusion. Extensive airspace consolidation consistent with pneumonia throughout the right upper lobe as well as in portions of the posterior segment of the right upper lobe. There is mild left base atelectasis. Wall thickening and fluid in the distal half of the esophagus. Suspect chronic reflux esophagitis.   Intake/Output from previous day: 10/30 0701 - 10/31 0700 In: 1210 [P.O.:960; IV Piggyback:250] Out: [id number] [Urine:3175; Drains:50] Intake/Output this shift: Total I/O In: 240 [P.O.:240] Out: 350 [Urine:350]  General appearance: alert, cooperative and [name] tired and worn out. Resp: clear to auscultation bilaterally GI: Soft nontender, tolerating diet, ongoing bile drainage.  Extremities: edema Ongoing bilateral lower extremity edema +1.  Lab Results:   Recent Labs   01/07/16 0305 01/08/16 0405  WBC 8.0 11.2*  HGB 8.6* 9.3*  HCT 27.8* 29.4*  PLT 341 378    BMET  Recent Labs  01/07/16 1100 01/08/16 0405  NA 139 138  K 3.2* 3.9  CL 110 107  CO2 20* 25  GLUCOSE 147* 100*  BUN 6 5*  CREATININE 0.79 0.81  CALCIUM 8.3* 7.9*   PT/INR No results for input(s): LABPROT, INR in the last 72 hours.   Recent Labs Lab 01/01/16 1407 01/07/16 1100 01/08/16 0405  AST  --   --  72*  ALT  --   --  57*  ALKPHOS  --   --  185*  BILITOT  --   --  0.9  PROT 4.4*  --  4.6*  ALBUMIN  --  2.3* 2.0*     Lipase     Component Value Date/Time   LIPASE 47 12/16/2015 0241     Studies/Results: Dg Chest 1 View  Result Date: 01/07/2016 CLINICAL DATA:  Status post thoracentesis EXAM: CHEST 1 VIEW COMPARISON:  None. FINDINGS: Trace bilateral pleural effusions. No pneumothorax. Mild right lower lung atelectasis. No consolidation. Cardiomediastinal silhouette right tip projecting the DC no acute osseous abnormality. IMPRESSION: 1. No right pneumothorax status post thoracentesis. Trace bilateral pleural effusions Electronically Signed   By: Kathreen Devoid   On: 01/07/2016 13:05   Dg Chest 2 View  Result Date: 01/08/2016 CLINICAL DATA:  Chest pain.  Shortness of breath . EXAM: CHEST  2 VIEW COMPARISON:  CT 01/07/2016.  Chest x-ray 01/07/2016 . FINDINGS: Right IJ line noted in stable position. Right  upper quadrant drainage catheters in stable position. Mediastinum hilar structures are normal. Mild right base infiltrate with small right pleural effusion. Bilateral pleural parenchymal thickening again noted consistent scarring. Prior cervical spine fusion . IMPRESSION: 1. Right IJ line in stable position . Right upper quadrant drainage catheters in stable position. 2. Mild right base infiltrate consistent with pneumonia. Bilateral pleural-parenchymal thickening again noted consistent with scarring. Electronically Signed   By: Marcello Moores  Register   On: 01/08/2016 08:00    Dg Chest 2 View  Result Date: 01/06/2016 CLINICAL DATA:  Assess bilateral pleural effusions. Right-sided thoracentesis October 2019. EXAM: CHEST  2 VIEW COMPARISON:  December 31, 2015 FINDINGS: The right central line terminates in the central SVC. No pneumothorax. A moderate right-sided pleural effusion is identified, larger in the interval. A tiny left effusion is identified. Pigtail catheters project over the right upper quadrant of the abdomen. No other interval changes or acute abnormalities. IMPRESSION: Moderate right pleural effusion, larger in the interval. Electronically Signed   By: Dorise Bullion III M.D   On: 01/06/2016 11:06   Ct Chest Wo Contrast  Result Date: 01/07/2016 CLINICAL DATA:  Several recent pleural effusions. Renal insufficiency EXAM: CT CHEST WITHOUT CONTRAST TECHNIQUE: Multidetector CT imaging of the chest was performed following the standard protocol without IV contrast. COMPARISON:  Chest radiograph January 07, 2016 ; CT abdomen and pelvis December 26, 2015 FINDINGS: Cardiovascular: There is no appreciable thoracic aortic aneurysm or dissection. In the visualized great vessels, there is moderate calcification in the proximal left subclavian and right common carotid arteries. There is approximately 50% diameter stenosis at the origin of the left subclavian artery. There are multiple foci of coronary artery calcification. The pericardium is not appreciably thickened. Mediastinum/Nodes: Thyroid appears unremarkable. There are multiple subcentimeter mediastinal lymph nodes without frank adenopathy by size criteria. There is a small hiatal hernia with fluid in the distal half of the esophagus, likely due to chronic reflux change. There is also thickening of the wall of the distal half of the esophagus. Lungs/Pleura: There is a fairly small free-flowing pleural effusion on the right with minimal free-flowing pleural effusion on the left. There is airspace consolidation throughout  much of the right lower lobe as well as in portions of the posterior segment of the right upper lobe. There is mild atelectatic change in the anterior lateral left base regions. Upper Abdomen: In the visualized upper abdomen, there is incomplete visualization of a drainage catheter adjacent to the liver edge on the right anteriorly, unchanged. A small amount of air is noted in the biliary ductal system which may be due to prior sphincterotomy. There is incomplete visualization of a biliary stent. Visualized upper abdominal structures otherwise appear unremarkable with limited upper abdominal visualization on this examination. Musculoskeletal: There are no blastic or lytic bone lesions. IMPRESSION: Fairly small right pleural effusion with minimal left pleural effusion. Extensive airspace consolidation consistent with pneumonia throughout the right upper lobe as well as in portions of the posterior segment of the right upper lobe. There is mild left base atelectasis. Wall thickening and fluid in the distal half of the esophagus. Suspect chronic reflux esophagitis. No adenopathy. Multiple foci of atherosclerotic calcification. Note that there is calcification in multiple coronary arteries. Incomplete visualization of drainage catheter in the anterior right upper quadrant. Air in the biliary ductal system may be due to prior sphincterotomy. Note that upper abdominal visualization is limited. Electronically Signed   By: Lowella Grip III M.D.   On: 01/07/2016 15:57  US Thoracentesis Asp Pleural Space W/img Guide  Result Date: 01/07/2016 INDICATION: Shortness of breath. Recurrent right pleural effusion. Request diagnostic and therapeutic thoracentesis. EXAM: ULTRASOUND GUIDED RIGHT THORACENTESIS MEDICATIONS: None. COMPLICATIONS: None immediate. PROCEDURE: An ultrasound guided thoracentesis was thoroughly discussed with the patient and questions answered. The benefits, risks, alternatives and complications were  also discussed. The patient understands and wishes to proceed with the procedure. Written consent was obtained. Ultrasound was performed to localize and mark an adequate pocket of fluid in the right chest. The area was then prepped and draped in the normal sterile fashion. 1% Lidocaine was used for local anesthesia. Under ultrasound guidance a Safe-T-Centesis catheter was introduced. Thoracentesis was performed. The catheter was removed and a dressing applied. FINDINGS: A total of approximately 1 L of hazy, yellow fluid was removed. Samples were sent to the laboratory as requested by the clinical team. IMPRESSION: Successful ultrasound guided right thoracentesis yielding 1 L of pleural fluid. Read by: Ascencion Dike PA-C Electronically Signed   By: Markus Daft M.D.   On: 01/07/2016 12:58    Medications: . ceFEPime (MAXIPIME) IV  2 g Intravenous Q12H  . clopidogrel  75 mg Oral Daily  . doxepin  200 mg Oral QHS  . famotidine  20 mg Oral BID  . feeding supplement (PRO-STAT SUGAR FREE 64)  30 mL Oral BID  . furosemide  20 mg Oral Daily  . gabapentin  600 mg Oral BID  . heparin  5,000 Units Subcutaneous Q8H  . loratadine  10 mg Oral Daily  . magic mouthwash w/lidocaine  5 mL Oral TID AC & HS  . saccharomyces boulardii  250 mg Oral BID  . sertraline  50 mg Oral BID  . topiramate  100 mg Oral BID  . vancomycin  125 mg Oral Q6H  . vancomycin  1,000 mg Intravenous Q12H     Assessment/Plan S/p laparoscopic cholecystectomy 12/06/15. 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 Wilfrid Lund; IR drain placement 12/17/15 Uncontrolled diarrhea x 24 hours VirdansStreptococcus Bacteremia - Zosyn/Vancomycin 10 days; Rocephin/Flagyl completed 12/24/15 - Dr. Tommy Medal Recurrent right pleural effusion; Bilateral pleural effusions R>L, s/p right thoracentesis 12/27/15 -m 1.8 L repeat right thoracentesis 12/31/15 1 liter by IR RUL Pneumonia per CT 01/07/16 - Cefepime started  01/07/16 Chronic kidney disease stage III Creatinine is improved Weakness and debilitation - get PT and OT involved Possible oral candidiasis - Magic mouth wash Malnutrition - nutrition consult Fluid overload 15.4 lbs up- home lasix restarting today. History of TIA on Plavix GERD - PPI Hypertension History of occipital neuralgia Dyslipidemia FEN: IV fluids/regular diet ID: Cefepime 2 doses d/ced 10/25/Oral vancomycin day 9/14/ Restarted on Cefepime 01/07/16, day 2 DVT: Heparin     Plan:  [place] assist with the Pneumonia. With ongoing drainage from the biloma we will repeat the HIDA scan today to evaluate the stent.       LOS: 8 days    Karrine Kluttz 01/08/2016 [phone]

## 2016-01-08 NOTE — Plan of Care (Signed)
Problem: Acute Rehab PT Goals(only PT should resolve) Goal: Pt Will Go Up/Down Stairs Outcome: Not Met (add Reason) Goal d/c by therapist. Discussed technique and pt verbally understood technique. Therapist did not feel a need to return demonstrate.

## 2016-01-09 LAB — COMPREHENSIVE METABOLIC PANEL
ALT: 47 U/L (ref 14–54)
AST: 43 U/L — ABNORMAL HIGH (ref 15–41)
Albumin: 2 g/dL — ABNORMAL LOW (ref 3.5–5.0)
Alkaline Phosphatase: 146 U/L — ABNORMAL HIGH (ref 38–126)
Anion gap: 8 (ref 5–15)
BUN: 6 mg/dL (ref 6–20)
CO2: 23 mmol/L (ref 22–32)
Calcium: 7.9 mg/dL — ABNORMAL LOW (ref 8.9–10.3)
Chloride: 109 mmol/L (ref 101–111)
Creatinine, Ser: 0.82 mg/dL (ref 0.44–1.00)
GFR calc Af Amer: >60 mL/min (ref >60)
GFR calc non Af Amer: >60 mL/min (ref >60)
Glucose, Bld: 97 mg/dL (ref 65–99)
Potassium: 3.8 mmol/L (ref 3.5–5.1)
Sodium: 140 mmol/L (ref 135–145)
Total Bilirubin: 0.6 mg/dL (ref 0.3–1.2)
Total Protein: 4.8 g/dL — ABNORMAL LOW (ref 6.5–8.1)

## 2016-01-09 LAB — CBC
HCT: 27.9 % — ABNORMAL LOW (ref 36.0–46.0)
Hemoglobin: 8.9 g/dL — ABNORMAL LOW (ref 12.0–15.0)
MCH: 28 pg (ref 26.0–34.0)
MCHC: 31.9 g/dL (ref 30.0–36.0)
MCV: 87.7 fL (ref 78.0–100.0)
Platelets: 344 10*3/uL (ref 150–400)
RBC: 3.18 MIL/uL — ABNORMAL LOW (ref 3.87–5.11)
RDW: 16.5 % — ABNORMAL HIGH (ref 11.5–15.5)
WBC: 7.9 10*3/uL (ref 4.0–10.5)

## 2016-01-09 MED ORDER — LEVOFLOXACIN 750 MG PO TABS
750.0000 mg | ORAL_TABLET | Freq: Every day | ORAL | Status: AC
  Administered 2016-01-09 – 2016-01-12 (×4): 750 mg via ORAL
  Filled 2016-01-09 (×4): qty 1

## 2016-01-09 NOTE — Progress Notes (Signed)
Progress Note: General Surgery Service   Subjective: Tolerating reg diet, no nausea or vomiting, no abdominal pain  Objective: Vital signs in last 24 hours: Temp:  [98.5 F (36.9 C)-98.6 F (37 C)] 98.5 F (36.9 C) (11/01 0546) Pulse Rate:  [98-104] 98 (11/01 0546) Resp:  [17-18] 18 (11/01 0546) BP: (108-131)/(75-76) 108/75 (11/01 0546) SpO2:  [95 %-98 %] 95 % (11/01 0546) Weight:  [77.7 kg (171 lb 4.8 oz)] 77.7 kg (171 lb 4.8 oz) (11/01 0546) Last BM Date: 01/06/16  Intake/Output from previous day: 10/31 0701 - 11/01 0700 In: 1060 [P.O.:1060] Out: 3820 [Urine:3800; Drains:20] Intake/Output this shift: No intake/output data recorded.  Abd: soft, NT, ND, 2 drains in right side with bilious output  Extremities: no edema  Neuro: AOx4  Lab Results: CBC   Recent Labs  01/08/16 0405 01/09/16 0510  WBC 11.2* 7.9  HGB 9.3* 8.9*  HCT 29.4* 27.9*  PLT 378 344   BMET  Recent Labs  01/08/16 0405 01/09/16 0510  NA 138 140  K 3.9 3.8  CL 107 109  CO2 25 23  GLUCOSE 100* 97  BUN 5* 6  CREATININE 0.81 0.82  CALCIUM 7.9* 7.9*   PT/INR No results for input(s): LABPROT, INR in the last 72 hours. ABG No results for input(s): PHART, HCO3 in the last 72 hours.  Invalid input(s): PCO2, PO2  Studies/Results:  Anti-infectives: Anti-infectives    Start     Dose/Rate Route Frequency Ordered Stop   01/08/16 0000  vancomycin (VANCOCIN) IVPB 1000 mg/200 mL premix     1,000 mg 200 mL/hr over 60 Minutes Intravenous Every 12 hours 01/07/16 2209     01/07/16 2300  ceFEPIme (MAXIPIME) 2 g in dextrose 5 % 50 mL IVPB     2 g 100 mL/hr over 30 Minutes Intravenous Every 12 hours 01/07/16 2209     01/07/16 0000  vancomycin (VANCOCIN) 50 mg/mL oral solution     125 mg Oral Every 6 hours 01/07/16 1311 01/14/16 2359   01/02/16 1100  ceFEPIme (MAXIPIME) 2 g in dextrose 5 % 50 mL IVPB  Status:  Discontinued     2 g 100 mL/hr over 30 Minutes Intravenous Every 24 hours 01/01/16  1349 01/02/16 1011   01/01/16 1100  ceFEPIme (MAXIPIME) 2 g in dextrose 5 % 50 mL IVPB  Status:  Discontinued     2 g 100 mL/hr over 30 Minutes Intravenous Every 24 hours 12/31/15 1128 01/01/16 1303   12/31/15 2300  vancomycin (VANCOCIN) IVPB 750 mg/150 ml premix  Status:  Discontinued     750 mg 150 mL/hr over 60 Minutes Intravenous Every 12 hours 12/31/15 1128 01/01/16 1345   12/31/15 1800  vancomycin (VANCOCIN) 50 mg/mL oral solution 125 mg     125 mg Oral Every 6 hours 12/31/15 1731     12/31/15 0945  ceFEPIme (MAXIPIME) 2 g in dextrose 5 % 50 mL IVPB     2 g 100 mL/hr over 30 Minutes Intravenous  Once 12/31/15 0932 12/31/15 1050   12/31/15 0945  vancomycin (VANCOCIN) IVPB 1000 mg/200 mL premix  Status:  Discontinued     1,000 mg 200 mL/hr over 60 Minutes Intravenous  Once 12/31/15 0932 12/31/15 0933   12/31/15 0945  vancomycin (VANCOCIN) 1,500 mg in sodium chloride 0.9 % 500 mL IVPB     1,500 mg 250 mL/hr over 120 Minutes Intravenous  Once 12/31/15 0933 12/31/15 1224      Medications: Scheduled Meds: . ceFEPime (MAXIPIME) IV  2 g Intravenous Q12H  . clopidogrel  75 mg Oral Daily  . doxepin  200 mg Oral QHS  . famotidine  20 mg Oral BID  . feeding supplement (PRO-STAT SUGAR FREE 64)  30 mL Oral BID  . furosemide  20 mg Oral Daily  . gabapentin  600 mg Oral BID  . heparin  5,000 Units Subcutaneous Q8H  . loratadine  10 mg Oral Daily  . magic mouthwash w/lidocaine  5 mL Oral TID AC & HS  . saccharomyces boulardii  250 mg Oral BID  . sertraline  50 mg Oral BID  . topiramate  100 mg Oral BID  . vancomycin  125 mg Oral Q6H  . vancomycin  1,000 mg Intravenous Q12H   Continuous Infusions:  PRN Meds:.acetaminophen **OR** acetaminophen, alum & mag hydroxide-simeth, ondansetron, oxyCODONE-acetaminophen, sodium chloride flush  Assessment/Plan: Patient Active Problem List   Diagnosis Date Noted  . SOB (shortness of breath)   . Difficulty in walking, not elsewhere classified   .  Diarrhea of presumed infectious origin   . LFT elevation   . Hypokalemia   . Enteritis due to Clostridium difficile   . Fever   . Pleural effusion   . Bilateral pleural effusion   . Encounter for central line placement   . Bacteremia   . Biloma   . Biloma following surgery   . Status post cholecystectomy   . Infection by Streptococcus, viridans group   . Routine screening for STI (sexually transmitted infection)   . Bile leak, postoperative   . Sepsis ([place]) 12/11/2015  . Symptomatic cholelithiasis 12/05/2015  . HLD (hyperlipidemia) 04/02/2015  . Chronic migraine without aura, with status migrainosus 09/26/2014  . Dizziness 11/09/2013  . Adjustment disorder with mixed anxiety and depressed mood 11/03/2013  . Migraine variant 10/10/2013  . Disturbance of skin sensation 10/10/2013  . TIA (transient ischemic attack) 10/10/2013  . HTN (hypertension) 07/18/2013  . 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 Neuralgia, neuritis, and radiculitis, unspecified 02/04/2012  . 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 Myofascial muscle pain 07/15/2011   s/p lap chole with biliary drain s/p stent placed by ERCp and image guided drain placement of biloma -continue drains as they appear to still drain a bilious like fluid -HIDA negative for leak, discussed these findings with family and patient   LOS: 9 days   Mickeal Skinner, MD Pg# [phone] [facility]

## 2016-01-09 NOTE — Progress Notes (Signed)
Spoke with intern, notified him of patient refusing the magic mouthwash and told me I could d/c the order. I also notified him of the patient refusing the plavix and he said that was a question for surgery to answer.

## 2016-01-09 NOTE — Consult Note (Signed)
Family Medicine Teaching Service Consult Note Intern Pager: [phone]  Patient name: [name] Medical record number: [id number] Date of birth: [birth year] Age: 65 y.o. Gender: female  Primary Care Provider: Georges Lynch, MD Consultants: Gastroenterology, Surgery Code Status: Full   Pt Overview and Major Events to Date:  9/28: Laproscopic Cholecystectyomy 10/03-10/20: Biliary stent, R LQ drain, thoracentesis 11/01: Transitioned to PO abx for pneumonia  Assessment and Plan: [name] is a 65 y/o F with a past medical history significant HLD, anxiety, depression, CKD stage III, migraines, and CVA s/p laparoscopic cholecysectomy who presented with fever, weakness and diarrhea secondary to C.difficile infection.  #Sepsis, resolved --Will continue to monitor vitals  #Diarrhea, improving C.diff antigen and toxin on 10/29 was negative. Patient will be getting vancomycin compounded by our pharmacy.   --Continue vancomycin 125 mg q6 until 14 days after completing Levaquin for pneumonia, anticpate completing course on 11/20 --Continue Enteric precautions --Probiotics (10/27>>)  #Pleural effusion, subacute,ongoing Patient had a thoracentesis yesterday (10/30) and drained 1L of pleural fluid. Subsequently chest CT showed Right upper and lower lobe pneumonia. Patient was started on antibiotics. --Transitioned from Vancomycin 1000 mg bid iv and Cefepime 2g bid iv to Levaquin 750 mg QD for 4 days --Will follow up on pleural analysis  #Post-thoracotomy pain syndrome --Continue Neurontin 600 mg bid --Oxycodone 5-325 Q4 prn  #HTN, stable  This morning BP 130/78 --Continue to hold home Ramipril 2.5 mg daily --Will restart   #History of CVA in 2015, stable With reported residual right sided weakness of right foot and right hand. Was started on Plavix.  --Hold Plavix for now given risk of bleeding  #LE swelling.  With no formal diagnosis of CHF. Last Echo 12/2015 with EF 55-60%.   At home on 20 mg Lasix daily. Patient with some LE extremities edema yesterday (10/29) and given lasix. Patient still has mild edema this morning. --Reassess fluid status --Continue lasix 20 mg po bid  #Hypokalemia, resolved  K+ 3.6 (10/28) --Will continue to replete as needed   --Follow BMET in AM.   #Hyperlipidemia:  Last lipid panel: Chol 145, TG 49, HDL 67, LDL 68. --Continue home Lipitor 40 mg daily  #CKD Stage III.   Cr on admission 0.91. Has improved throughout stay 0.63 (10/28). --Avoid nephrotoxic agents --Renally dose medications  #Migraines --Continue home Topamax 100 mg bid  #Anxiety.   Reports also trouble sleeping.  --Continue home Doxepin 200 mg daily at bedtime  #Depression --Continue home Zoloft 50 mg bid  FEN/GI: regular diet PPx: SCDs, claritin   Disposition: Pneumonia seems to be improved with no symptoms today. Transitioned abx to PO. Patient is clear for discharge from [place] standpoint concerning pneumonia.  Subjective:  No overnight events. Remains stable and afebrile. No oxygen requirement. Patient denies fevers, chills, nausea, dyspnea, chest pain, cough, or abdominal pain.  Objective: Temp:  [98.5 F (36.9 C)-98.6 F (37 C)] 98.6 F (37 C) (11/01 1335) Pulse Rate:  [84-104] 84 (11/01 1335) Resp:  [18] 18 (11/01 1335) BP: (108-131)/(74-75) 120/74 (11/01 1335) SpO2:  [95 %] 95 % (11/01 1335) Weight:  [171 lb 4.8 oz (77.7 kg)] 171 lb 4.8 oz (77.7 kg) (11/01 0546)  Physical Exam: General: [name], female lying in hospital bed, in NAD HEENT: EOMI, MMM Cardiovascular: RRR, no MRG, 2+ pulses  Respiratory: CTA B/L no wheezing noted Abdomen: obese, soft, NT ND, biliary drain in place covered with dry bandage and green discharge Extremities: warm, well perfused, no edema or tenderness, moves all  extremities spontaneously Neuro: [name]  Laboratory:  Recent Labs Lab 01/07/16 0305 01/08/16 0405 01/09/16 0510  WBC 8.0 11.2* 7.9  HGB 8.6*  9.3* 8.9*  HCT 27.8* 29.4* 27.9*  PLT 341 378 344    Recent Labs Lab 01/07/16 1100 01/08/16 0405 01/09/16 0510  NA 139 138 140  K 3.2* 3.9 3.8  CL 110 107 109  CO2 20* 25 23  BUN 6 5* 6  CREATININE 0.79 0.81 0.82  CALCIUM 8.3* 7.9* 7.9*  PROT  --  4.6* 4.8*  BILITOT  --  0.9 0.6  ALKPHOS  --  185* 146*  ALT  --  57* 47  AST  --  72* 43*  GLUCOSE 147* 100* 97    Imaging/Diagnostic Tests: Dg Chest 1 View  Result Date: 01/07/2016 CLINICAL DATA:  Status post thoracentesis EXAM: CHEST 1 VIEW COMPARISON:  None. FINDINGS: Trace bilateral pleural effusions. No pneumothorax. Mild right lower lung atelectasis. No consolidation. Cardiomediastinal silhouette right tip projecting the DC no acute osseous abnormality. IMPRESSION: 1. No right pneumothorax status post thoracentesis. Trace bilateral pleural effusions Electronically Signed   By: Kathreen Devoid   On: 01/07/2016 13:05   Dg Chest 2 View  Result Date: 01/08/2016 CLINICAL DATA:  Chest pain.  Shortness of breath . EXAM: CHEST  2 VIEW COMPARISON:  CT 01/07/2016.  Chest x-ray 01/07/2016 . FINDINGS: Right IJ line noted in stable position. Right upper quadrant drainage catheters in stable position. Mediastinum hilar structures are normal. Mild right base infiltrate with small right pleural effusion. Bilateral pleural parenchymal thickening again noted consistent scarring. Prior cervical spine fusion . IMPRESSION: 1. Right IJ line in stable position . Right upper quadrant drainage catheters in stable position. 2. Mild right base infiltrate consistent with pneumonia. Bilateral pleural-parenchymal thickening again noted consistent with scarring. Electronically Signed   By: Marcello Moores  Register   On: 01/08/2016 08:00   Dg Chest 2 View  Result Date: 01/06/2016 CLINICAL DATA:  Assess bilateral pleural effusions. Right-sided thoracentesis October 2019. EXAM: CHEST  2 VIEW COMPARISON:  December 31, 2015 FINDINGS: The right central line terminates in the  central SVC. No pneumothorax. A moderate right-sided pleural effusion is identified, larger in the interval. A tiny left effusion is identified. Pigtail catheters project over the right upper quadrant of the abdomen. No other interval changes or acute abnormalities. IMPRESSION: Moderate right pleural effusion, larger in the interval. Electronically Signed   By: Dorise Bullion III M.D   On: 01/06/2016 11:06   Ct Chest Wo Contrast  Result Date: 01/07/2016 CLINICAL DATA:  Several recent pleural effusions. Renal insufficiency EXAM: CT CHEST WITHOUT CONTRAST TECHNIQUE: Multidetector CT imaging of the chest was performed following the standard protocol without IV contrast. COMPARISON:  Chest radiograph January 07, 2016 ; CT abdomen and pelvis December 26, 2015 FINDINGS: Cardiovascular: There is no appreciable thoracic aortic aneurysm or dissection. In the visualized great vessels, there is moderate calcification in the proximal left subclavian and right common carotid arteries. There is approximately 50% diameter stenosis at the origin of the left subclavian artery. There are multiple foci of coronary artery calcification. The pericardium is not appreciably thickened. Mediastinum/Nodes: Thyroid appears unremarkable. There are multiple subcentimeter mediastinal lymph nodes without frank adenopathy by size criteria. There is a small hiatal hernia with fluid in the distal half of the esophagus, likely due to chronic reflux change. There is also thickening of the wall of the distal half of the esophagus. Lungs/Pleura: There is a fairly small free-flowing pleural effusion  on the right with minimal free-flowing pleural effusion on the left. There is airspace consolidation throughout much of the right lower lobe as well as in portions of the posterior segment of the right upper lobe. There is mild atelectatic change in the anterior lateral left base regions. Upper Abdomen: In the visualized upper abdomen, there is  incomplete visualization of a drainage catheter adjacent to the liver edge on the right anteriorly, unchanged. A small amount of air is noted in the biliary ductal system which may be due to prior sphincterotomy. There is incomplete visualization of a biliary stent. Visualized upper abdominal structures otherwise appear unremarkable with limited upper abdominal visualization on this examination. Musculoskeletal: There are no blastic or lytic bone lesions. IMPRESSION: Fairly small right pleural effusion with minimal left pleural effusion. Extensive airspace consolidation consistent with pneumonia throughout the right upper lobe as well as in portions of the posterior segment of the right upper lobe. There is mild left base atelectasis. Wall thickening and fluid in the distal half of the esophagus. Suspect chronic reflux esophagitis. No adenopathy. Multiple foci of atherosclerotic calcification. Note that there is calcification in multiple coronary arteries. Incomplete visualization of drainage catheter in the anterior right upper quadrant. Air in the biliary ductal system may be due to prior sphincterotomy. Note that upper abdominal visualization is limited. Electronically Signed   By: Lowella Grip III M.D.   On: 01/07/2016 15:57   Nm Hepatobiliary Including Gb  Result Date: 01/08/2016 CLINICAL DATA:  Bile leak post cholecystectomy on 12/06/2015, post stenting EXAM: NUCLEAR MEDICINE HEPATOBILIARY IMAGING TECHNIQUE: Sequential images of the abdomen were obtained out to 60 minutes following intravenous administration of radiopharmaceutical. RADIOPHARMACEUTICALS:  5.3 mCi Tc-58m  Choletec IV COMPARISON:  12/11/2015 FINDINGS: Normal tracer extraction from bloodstream indicating normal hepatocellular function. Prompt excretion of tracer into biliary tree. Small bowel visualized by 17 minutes. No hepatic retention of tracer. No abnormal accumulation of tracer is seen at the gallbladder fossa, in perihepatic  space, or at the RIGHT pericolic gutter to suggest presence of an active bile leak. IMPRESSION: Patent biliary tree post cholecystectomy. No scintigraphic evidence of bile leak or CBD obstruction. Electronically Signed   By: Lavonia Dana M.D.   On: 01/08/2016 14:19   US Thoracentesis Asp Pleural Space W/img Guide  Result Date: 01/07/2016 INDICATION: Shortness of breath. Recurrent right pleural effusion. Request diagnostic and therapeutic thoracentesis. EXAM: ULTRASOUND GUIDED RIGHT THORACENTESIS MEDICATIONS: None. COMPLICATIONS: None immediate. PROCEDURE: An ultrasound guided thoracentesis was thoroughly discussed with the patient and questions answered. The benefits, risks, alternatives and complications were also discussed. The patient understands and wishes to proceed with the procedure. Written consent was obtained. Ultrasound was performed to localize and mark an adequate pocket of fluid in the right chest. The area was then prepped and draped in the normal sterile fashion. 1% Lidocaine was used for local anesthesia. Under ultrasound guidance a Safe-T-Centesis catheter was introduced. Thoracentesis was performed. The catheter was removed and a dressing applied. FINDINGS: A total of approximately 1 L of hazy, yellow fluid was removed. Samples were sent to the laboratory as requested by the clinical team. IMPRESSION: Successful ultrasound guided right thoracentesis yielding 1 L of pleural fluid. Read by: Ascencion Dike PA-C Electronically Signed   By: Markus Daft M.D.   On: 01/07/2016 12:58    Rainier Bing, DO 01/09/2016, 3:02 PM PGY-1, [place] Intern pager: [phone], text pages welcome

## 2016-01-09 NOTE — Progress Notes (Addendum)
Occupational Therapy Treatment/Discharge Patient Details Name: [name] MRN: [id number] DOB: Dec 27, 1950 Today's Date: 01/09/2016    History of present illness Ms. Wolgamott is a 65 yo female with PMHx of HTN, HLD, anxiety, depression, and cholelithiasis s/p laparoscopic cholecystectomy with IOC on 12/06/15 who was admitted recently from 10/2 to 10/18 with a large biloma s/p drain placement and right sided pleural effusion s/p thoracentesis who presented on 10/23 with recurrent fevers, shortness of breath, diarrhea and abdominal pain 2/2 C. Difficile Colitis.    OT comments  Pt with much improved activity tolerance this session. Reviewed energy conservation techniques with pt and provided handout. She reports and demonstrates understanding. Pt currently demonstrating modified independence with all ADL and functional mobility. All education complete concerning fall prevention and safe use of DME as well as chair push-ups for B UE strengthening. Pt has no further questions or concerns. Pt has no further acute OT needs. OT will sign off.   Follow Up Recommendations  No OT follow up;Supervision/Assistance - 24 hour    Equipment Recommendations  Tub/shower seat    Recommendations for Other Services      Precautions / Restrictions Precautions Precautions: Other (comment) (Drains) Precaution Comments: drains Restrictions Weight Bearing Restrictions: No       Mobility Bed Mobility               General bed mobility comments: Out of bed in chair on OT arrival  Transfers Overall transfer level: Modified independent Equipment used: Rolling walker (2 wheeled);None                  Balance Overall balance assessment: Needs assistance Sitting-balance support: Feet supported;No upper extremity supported Sitting balance-Leahy Scale: Normal     Standing balance support: During functional activity;No upper extremity supported Standing balance-Leahy Scale: Good                      ADL Overall ADL's : Needs assistance/impaired     Grooming: Wash/dry hands;Standing;Modified independent                   Toilet Transfer: Lawyer;Modified Independent           Functional mobility during ADLs: Supervision/safety;Rolling walker General ADL Comments: COmpleted education concerning energy conservation and UE strengthening activities to complete post-acute D/C.      Vision                     Perception     Praxis      Cognition   Behavior During Therapy: WFL for tasks assessed/performed Overall Cognitive Status: Within Functional Limits for tasks assessed                       Extremity/Trunk Assessment               Exercises     Shoulder Instructions       General Comments      Pertinent Vitals/ Pain       Pain Assessment: No/denies pain  Home Living                                          Prior Functioning/Environment              Frequency  Min 2X/week        Progress Toward  Goals  OT Goals(current goals can now be found in the care plan section)  Progress towards OT goals: Progressing toward goals;Goals met/education completed, patient discharged from OT  Acute Rehab OT Goals Patient Stated Goal: get better OT Goal Formulation: With patient Time For Goal Achievement: 01/18/16 Potential to Achieve Goals: Good ADL Goals Pt Will Transfer to Toilet: with modified independence;ambulating;regular height toilet Pt Will Perform Toileting - Clothing Manipulation and hygiene: with modified independence;sit to/from stand Pt Will Perform Tub/Shower Transfer: with modified independence;ambulating;shower seat;Tub transfer Pt/caregiver will Perform Home Exercise Program: Increased strength;Both right and left upper extremity;With written HEP provided;Independently;With theraband Additional ADL Goal #1: Pt will independently implement 3 energy  conservation strategies into ADL session.  Plan Discharge plan remains appropriate    Co-evaluation                 End of Session Equipment Utilized During Treatment: Gait belt;Rolling walker   Activity Tolerance Patient limited by fatigue   Patient Left in chair;with call bell/phone within reach   Nurse Communication          Time: 4128-2081 OT Time Calculation (min): 25 min  Charges: OT General Charges $OT Visit: 1 Procedure OT Treatments $Self Care/Home Management : 23-37 mins  Marcie Shearon A Jasmon Mattice 01/09/2016, 3:47 PM

## 2016-01-10 LAB — COMPREHENSIVE METABOLIC PANEL
ALT: 34 U/L (ref 14–54)
AST: 26 U/L (ref 15–41)
Albumin: 2.2 g/dL — ABNORMAL LOW (ref 3.5–5.0)
Alkaline Phosphatase: 160 U/L — ABNORMAL HIGH (ref 38–126)
Anion gap: 8 (ref 5–15)
BUN: 7 mg/dL (ref 6–20)
CO2: 24 mmol/L (ref 22–32)
Calcium: 8 mg/dL — ABNORMAL LOW (ref 8.9–10.3)
Chloride: 105 mmol/L (ref 101–111)
Creatinine, Ser: 1.05 mg/dL — ABNORMAL HIGH (ref 0.44–1.00)
GFR calc Af Amer: >60 mL/min (ref >60)
GFR calc non Af Amer: 55 mL/min — ABNORMAL LOW (ref >60)
Glucose, Bld: 113 mg/dL — ABNORMAL HIGH (ref 65–99)
Potassium: 3.6 mmol/L (ref 3.5–5.1)
Sodium: 137 mmol/L (ref 135–145)
Total Bilirubin: 0.4 mg/dL (ref 0.3–1.2)
Total Protein: 5.3 g/dL — ABNORMAL LOW (ref 6.5–8.1)

## 2016-01-10 LAB — CBC
HCT: 33 % — ABNORMAL LOW (ref 36.0–46.0)
Hemoglobin: 10.3 g/dL — ABNORMAL LOW (ref 12.0–15.0)
MCH: 27.7 pg (ref 26.0–34.0)
MCHC: 31.2 g/dL (ref 30.0–36.0)
MCV: 88.7 fL (ref 78.0–100.0)
Platelets: 413 10*3/uL — ABNORMAL HIGH (ref 150–400)
RBC: 3.72 MIL/uL — ABNORMAL LOW (ref 3.87–5.11)
RDW: 16.2 % — ABNORMAL HIGH (ref 11.5–15.5)
WBC: 14.8 10*3/uL — ABNORMAL HIGH (ref 4.0–10.5)

## 2016-01-10 NOTE — Progress Notes (Signed)
Subjective: She is doing much better.  Her biggest complaint today is discomfort on inspiration which has occurred since her last thoracentesis. She also notes she gets short of breath after walking a distance. I did see her walking yesterday, she was independent and doing quite well. No nausea, she is tolerating diet well. She says she picks and chooses. Her diarrhea has resolved. She is also asking about when the drains can come out. She continues to have some discomfort at the drain sites. These were cleaned and redressed earlier this a.m.  Objective: Vital signs in last 24 hours: Temp:  [98.6 F (37 C)-99.4 F (37.4 C)] 98.9 F (37.2 C) (11/02 0948) Pulse Rate:  [84-111] 111 (11/02 0421) Resp:  [16-18] 16 (11/02 0421) BP: (120-132)/(69-74) 132/69 (11/02 0421) SpO2:  [95 %-97 %] 97 % (11/02 0421) Weight:  [76.9 kg (169 lb 8.5 oz)] 76.9 kg (169 lb 8.5 oz) (11/02 0421) Last BM Date: 01/10/16 PO 1660  urine 2950 Drain 30 Stool x 2  Afebrile, VSS No labs Specimen Description FLUID PLEURAL RIGHT   Special Requests NONE   Culture NO GROWTH 3 DAYS   Report Status PENDING   Resulting Agency [id number]    Specimen Collected: 01/07/16 12:17     Discharged from OT   Intake/Output from previous day: 11/01 0701 - 11/02 0700 In: [id number] [P.O.:1660; I.V.:20] Out: 2980 [Urine:2950; Drains:30] Intake/Output this shift: Total I/O In: 240 [P.O.:240] Out: -   General appearance: alert, cooperative and no distress Resp: clear to auscultation bilaterally and still down a little in the right base.  Moving air well both lungs. GI: soft, still sore at the drain sites.  Drainage is down, but it is bilious.  soft, + BS, no distension or pain. Extremities: Still has some swelling over her ankles, but a majority of the edema has resolved.  Lab Results:   Recent Labs  01/09/16 0510 01/10/16 0527  WBC 7.9 14.8*  HGB 8.9* 10.3*  HCT 27.9* 33.0*  PLT 344 413*    BMET  Recent Labs  01/08/16 0405 01/09/16 0510  NA 138 140  K 3.9 3.8  CL 107 109  CO2 25 23  GLUCOSE 100* 97  BUN 5* 6  CREATININE 0.81 0.82  CALCIUM 7.9* 7.9*   PT/INR No results for input(s): LABPROT, INR in the last 72 hours.   Recent Labs Lab 01/07/16 1100 01/08/16 0405 01/09/16 0510  AST  --  72* 43*  ALT  --  57* 47  ALKPHOS  --  185* 146*  BILITOT  --  0.9 0.6  PROT  --  4.6* 4.8*  ALBUMIN 2.3* 2.0* 2.0*     Lipase     Component Value Date/Time   LIPASE 47 12/16/2015 0241     Studies/Results: Nm Hepatobiliary Including Gb  Result Date: 01/08/2016 CLINICAL DATA:  Bile leak post cholecystectomy on 12/06/2015, post stenting EXAM: NUCLEAR MEDICINE HEPATOBILIARY IMAGING TECHNIQUE: Sequential images of the abdomen were obtained out to 60 minutes following intravenous administration of radiopharmaceutical. RADIOPHARMACEUTICALS:  5.3 mCi Tc-80m  Choletec IV COMPARISON:  12/11/2015 FINDINGS: Normal tracer extraction from bloodstream indicating normal hepatocellular function. Prompt excretion of tracer into biliary tree. Small bowel visualized by 17 minutes. No hepatic retention of tracer. No abnormal accumulation of tracer is seen at the gallbladder fossa, in perihepatic space, or at the RIGHT pericolic gutter to suggest presence of an active bile leak. IMPRESSION: Patent biliary tree post cholecystectomy. No scintigraphic evidence of bile leak or CBD obstruction.  Electronically Signed   By: Lavonia Dana M.D.   On: 01/08/2016 14:19    Medications: . clopidogrel  75 mg Oral Daily  . doxepin  200 mg Oral QHS  . famotidine  20 mg Oral BID  . feeding supplement (PRO-STAT SUGAR FREE 64)  30 mL Oral BID  . furosemide  20 mg Oral Daily  . gabapentin  600 mg Oral BID  . heparin  5,000 Units Subcutaneous Q8H  . levofloxacin  750 mg Oral Daily  . loratadine  10 mg Oral Daily  . saccharomyces boulardii  250 mg Oral BID  . sertraline  50 mg Oral BID  . topiramate  100 mg Oral BID  . vancomycin   125 mg Oral Q6H   GERD - PPI Hypertension History of occipital neuralgia Dyslipidemia Assessment/Plan S/p laparoscopic cholecystectomy 12/06/15. Readmitted 12/11/15 with biloma,  - negative HIDA 01/08/16 ERCP with biliary stent placement 12/12/15 Dr. Wilfrid Lund; IR drain placement 12/17/15 Uncontrolled diarrhea x 24 hours - C. difficile colitis positive now resolved VirdansStreptococcus Bacteremia - Zosyn/Vancomycin 10 days; Rocephin/Flagyl completed 12/24/15 - Dr. Tommy Medal Recurrent right pleural effusion; Bilateral pleural effusions R>L, s/p right thoracentesis 12/27/15 -m 1.8 L repeat right thoracentesis 12/31/15 1 liter by IR RUL Pneumonia per CT 01/07/16 - Cefepime started 01/07/16 Chronic kidney disease stage III Creatinine is improved Weakness and debilitation - get PT and OT involved Possible oral candidiasis - Magic mouth wash - resolved Malnutrition - nutrition consult Fluid overload 15.4 lbs up- back on daily home dose - still up 10 lbs History of TIA on Plavix -restarted  FEN: IV fluids/regular diet ID: Day 11/14 Oral vancomycin, 3 days of Cefepime completed 11/1, converted to Levafloxin now day 2 DVT: Heparin     Plan:  Recheck labs and chest x-ray in the a.m.   Will ask Medicine to determine plan for home meds and discharge meds.  I don't know if we can get her to go on a Friday, but hopefully she is close to going home.    LOS: 10 days    Essica Kiker 01/10/2016 [phone]

## 2016-01-10 NOTE — Consult Note (Signed)
Family Medicine Teaching Service Consult Note Intern Pager: [phone]  Patient name: [name] Medical record number: [id number] Date of birth: 21-Aug-1950 Age: 65 y.o. Gender: female  Primary Care Provider: Georges Lynch, MD Consultants: Gastroenterology, Surgery Code Status: Full   Pt Overview and Major Events to Date:  9/28: Laproscopic Cholecystectyomy 10/03-10/20: Biliary stent, R LQ drain, thoracentesis 11/01: Transitioned to PO abx for pneumonia  Assessment and Plan: [name] is a 65 y/o F with a past medical history significant HLD, anxiety, depression, CKD stage III, migraines, and CVA s/p laparoscopic cholecysectomy who presented with fever, weakness and diarrhea secondary to C.difficile infection.  #Sepsis, resolved --Will continue to monitor vitals  #Diarrhea, resolved C.diff antigen and toxin on 10/29 was negative. Patient getting vancomycin compounded by our pharmacy.   --Continue vancomycin 125 mg q6 until 14 days after completing Levaquin for pneumonia (day 2 of 4), anticpate completing course on 11/20 --Continue Enteric precautions --Probiotics (10/27>>)  #Pleural effusion, subacute, ongoing Patient had a thoracentesis 10/30 and drained 1L of pleural fluid. Subsequently chest CT showed Right upper and lower lobe pneumonia. Patient was started on antibiotics. WBC increased to 14.8 on 11/2. Pt lungs sound improved and pt w/o cough. No SOB or CP. Remains afebrile. Source of leukocytosis likely not related to pneumonia given improvement of symptoms.  --Transitioned from Vancomycin 1000 mg bid iv and Cefepime 2g bid iv to Levaquin 750 mg QD for 4 days (on day 2 of 4) --Will follow up on pleural analysis --F/u CXR in AM  #Post-Thoracotomy Pain Syndrome --Continue Neurontin 600 mg bid --Oxycodone 5-325 Q4 prn  #HTN, stable  This morning BP 132/69 --Continue to hold home Ramipril 2.5 mg daily, can restart after f/u with PCP upon d/c  #History of CVA in  2015, stable With reported residual right sided weakness of right foot and right hand. Was started on Plavix.  --Plavix 75 mg QD  #LE swelling.  With no formal diagnosis of CHF. Last Echo 12/2015 with EF 55-60%.  At home on 20 mg Lasix daily. Patient with some LE extremities edema and given lasix. Patient still has mild edema this morning. --Reassess fluid status --Continue lasix 20 mg po bid  #Hypokalemia, resolved  K+ 3.6 (10/28) --Will continue to replete as needed   --Follow CMET in AM  #Hyperlipidemia  Last lipid panel: Chol 145, TG 49, HDL 67, LDL 68. --Restart home Lipitor 40 mg daily upon d/c  #CKD Stage III, stable   Cr on admission 0.91. Has improved throughout stay 0.63 (10/28). --Avoid nephrotoxic agents --Renally dose medications  #Migraines, stable --Continue home Topamax 100 mg bid  #Anxiety, stable Reports also trouble sleeping.  --Continue home Doxepin 200 mg daily at bedtime  #Depression, stable --Continue home Zoloft 50 mg bid  FEN/GI: regular diet PPx: SCDs, claritin   Disposition: Pneumonia continues to improve with no symptoms today. Continuing day 2 of 4 with Levaquin. Patient is clear for discharge from [place] standpoint concerning pneumonia. FPTS will be signing off. Patient will continue Vancomycin PO for C. Diff until 11/20. Pharmacy aware.  Subjective:  No overnight events. Remains stable and afebrile. No oxygen requirement. Patient denies fevers, chills, nausea, dyspnea, chest pain, cough, or abdominal pain. Accompanied by daughter.  Objective: Temp:  [98.6 F (37 C)-99.4 F (37.4 C)] 99 F (37.2 C) (11/02 0421) Pulse Rate:  [84-111] 111 (11/02 0421) Resp:  [16-18] 16 (11/02 0421) BP: (120-132)/(69-74) 132/69 (11/02 0421) SpO2:  [95 %-97 %] 97 % (11/02 0421) Weight:  [  169 lb 8.5 oz (76.9 kg)] 169 lb 8.5 oz (76.9 kg) (11/02 0421)  Physical Exam: General: [name], female lying in hospital bed, in NAD HEENT: EOMI, MMM Cardiovascular:  RRR, no MRG, 2+ pulses  Respiratory: mild crackles on right with no wheezing B/L, no cough  Abdomen: obese, soft, NT ND, biliary drain in place covered with dry bandage and green discharge Extremities: warm, well perfused, no edema or tenderness, moves all extremities spontaneously Neuro: [name]  Laboratory:  Recent Labs Lab 01/08/16 0405 01/09/16 0510 01/10/16 0527  WBC 11.2* 7.9 14.8*  HGB 9.3* 8.9* 10.3*  HCT 29.4* 27.9* 33.0*  PLT 378 344 413*    Recent Labs Lab 01/07/16 1100 01/08/16 0405 01/09/16 0510  NA 139 138 140  K 3.2* 3.9 3.8  CL 110 107 109  CO2 20* 25 23  BUN 6 5* 6  CREATININE 0.79 0.81 0.82  CALCIUM 8.3* 7.9* 7.9*  PROT  --  4.6* 4.8*  BILITOT  --  0.9 0.6  ALKPHOS  --  185* 146*  ALT  --  57* 47  AST  --  72* 43*  GLUCOSE 147* 100* 97    Imaging/Diagnostic Tests: Dg Chest 1 View  Result Date: 01/07/2016 CLINICAL DATA:  Status post thoracentesis EXAM: CHEST 1 VIEW COMPARISON:  None. FINDINGS: Trace bilateral pleural effusions. No pneumothorax. Mild right lower lung atelectasis. No consolidation. Cardiomediastinal silhouette right tip projecting the DC no acute osseous abnormality. IMPRESSION: 1. No right pneumothorax status post thoracentesis. Trace bilateral pleural effusions Electronically Signed   By: Kathreen Devoid   On: 01/07/2016 13:05   Dg Chest 2 View  Result Date: 01/08/2016 CLINICAL DATA:  Chest pain.  Shortness of breath . EXAM: CHEST  2 VIEW COMPARISON:  CT 01/07/2016.  Chest x-ray 01/07/2016 . FINDINGS: Right IJ line noted in stable position. Right upper quadrant drainage catheters in stable position. Mediastinum hilar structures are normal. Mild right base infiltrate with small right pleural effusion. Bilateral pleural parenchymal thickening again noted consistent scarring. Prior cervical spine fusion . IMPRESSION: 1. Right IJ line in stable position . Right upper quadrant drainage catheters in stable position. 2. Mild right base infiltrate  consistent with pneumonia. Bilateral pleural-parenchymal thickening again noted consistent with scarring. Electronically Signed   By: Marcello Moores  Register   On: 01/08/2016 08:00   Dg Chest 2 View  Result Date: 01/06/2016 CLINICAL DATA:  Assess bilateral pleural effusions. Right-sided thoracentesis October 2019. EXAM: CHEST  2 VIEW COMPARISON:  December 31, 2015 FINDINGS: The right central line terminates in the central SVC. No pneumothorax. A moderate right-sided pleural effusion is identified, larger in the interval. A tiny left effusion is identified. Pigtail catheters project over the right upper quadrant of the abdomen. No other interval changes or acute abnormalities. IMPRESSION: Moderate right pleural effusion, larger in the interval. Electronically Signed   By: Dorise Bullion III M.D   On: 01/06/2016 11:06   Ct Chest Wo Contrast  Result Date: 01/07/2016 CLINICAL DATA:  Several recent pleural effusions. Renal insufficiency EXAM: CT CHEST WITHOUT CONTRAST TECHNIQUE: Multidetector CT imaging of the chest was performed following the standard protocol without IV contrast. COMPARISON:  Chest radiograph January 07, 2016 ; CT abdomen and pelvis December 26, 2015 FINDINGS: Cardiovascular: There is no appreciable thoracic aortic aneurysm or dissection. In the visualized great vessels, there is moderate calcification in the proximal left subclavian and right common carotid arteries. There is approximately 50% diameter stenosis at the origin of the left subclavian artery. There  are multiple foci of coronary artery calcification. The pericardium is not appreciably thickened. Mediastinum/Nodes: Thyroid appears unremarkable. There are multiple subcentimeter mediastinal lymph nodes without frank adenopathy by size criteria. There is a small hiatal hernia with fluid in the distal half of the esophagus, likely due to chronic reflux change. There is also thickening of the wall of the distal half of the esophagus.  Lungs/Pleura: There is a fairly small free-flowing pleural effusion on the right with minimal free-flowing pleural effusion on the left. There is airspace consolidation throughout much of the right lower lobe as well as in portions of the posterior segment of the right upper lobe. There is mild atelectatic change in the anterior lateral left base regions. Upper Abdomen: In the visualized upper abdomen, there is incomplete visualization of a drainage catheter adjacent to the liver edge on the right anteriorly, unchanged. A small amount of air is noted in the biliary ductal system which may be due to prior sphincterotomy. There is incomplete visualization of a biliary stent. Visualized upper abdominal structures otherwise appear unremarkable with limited upper abdominal visualization on this examination. Musculoskeletal: There are no blastic or lytic bone lesions. IMPRESSION: Fairly small right pleural effusion with minimal left pleural effusion. Extensive airspace consolidation consistent with pneumonia throughout the right upper lobe as well as in portions of the posterior segment of the right upper lobe. There is mild left base atelectasis. Wall thickening and fluid in the distal half of the esophagus. Suspect chronic reflux esophagitis. No adenopathy. Multiple foci of atherosclerotic calcification. Note that there is calcification in multiple coronary arteries. Incomplete visualization of drainage catheter in the anterior right upper quadrant. Air in the biliary ductal system may be due to prior sphincterotomy. Note that upper abdominal visualization is limited. Electronically Signed   By: Lowella Grip III M.D.   On: 01/07/2016 15:57   Nm Hepatobiliary Including Gb  Result Date: 01/08/2016 CLINICAL DATA:  Bile leak post cholecystectomy on 12/06/2015, post stenting EXAM: NUCLEAR MEDICINE HEPATOBILIARY IMAGING TECHNIQUE: Sequential images of the abdomen were obtained out to 60 minutes following intravenous  administration of radiopharmaceutical. RADIOPHARMACEUTICALS:  5.3 mCi Tc-6m  Choletec IV COMPARISON:  12/11/2015 FINDINGS: Normal tracer extraction from bloodstream indicating normal hepatocellular function. Prompt excretion of tracer into biliary tree. Small bowel visualized by 17 minutes. No hepatic retention of tracer. No abnormal accumulation of tracer is seen at the gallbladder fossa, in perihepatic space, or at the RIGHT pericolic gutter to suggest presence of an active bile leak. IMPRESSION: Patent biliary tree post cholecystectomy. No scintigraphic evidence of bile leak or CBD obstruction. Electronically Signed   By: Lavonia Dana M.D.   On: 01/08/2016 14:19   US Thoracentesis Asp Pleural Space W/img Guide  Result Date: 01/07/2016 INDICATION: Shortness of breath. Recurrent right pleural effusion. Request diagnostic and therapeutic thoracentesis. EXAM: ULTRASOUND GUIDED RIGHT THORACENTESIS MEDICATIONS: None. COMPLICATIONS: None immediate. PROCEDURE: An ultrasound guided thoracentesis was thoroughly discussed with the patient and questions answered. The benefits, risks, alternatives and complications were also discussed. The patient understands and wishes to proceed with the procedure. Written consent was obtained. Ultrasound was performed to localize and mark an adequate pocket of fluid in the right chest. The area was then prepped and draped in the normal sterile fashion. 1% Lidocaine was used for local anesthesia. Under ultrasound guidance a Safe-T-Centesis catheter was introduced. Thoracentesis was performed. The catheter was removed and a dressing applied. FINDINGS: A total of approximately 1 L of hazy, yellow fluid was removed. Samples were sent  to the laboratory as requested by the clinical team. IMPRESSION: Successful ultrasound guided right thoracentesis yielding 1 L of pleural fluid. Read by: Ascencion Dike PA-C Electronically Signed   By: Markus Daft M.D.   On: 01/07/2016 12:58    Trenton Bing, DO 01/10/2016, 7:14 AM PGY-1, [place] Intern pager: [phone], text pages welcome

## 2016-01-10 NOTE — Progress Notes (Signed)
Nutrition Follow-up  DOCUMENTATION CODES:   Not applicable  INTERVENTION:   -D/c 30 ml Prostat BID  NUTRITION DIAGNOSIS:   Inadequate oral intake related to poor appetite as evidenced by meal completion < 50%.  Progressing  GOAL:   Patient will meet greater than or equal to 90% of their needs  Progressing  MONITOR:   PO intake, Supplement acceptance, Labs, Weight trends, Skin, I & O's  REASON FOR ASSESSMENT:   Consult Poor PO  ASSESSMENT:   Patient recently hospitalized from 12/11/15 through 12/28/15 with a biloma, status post cholecystectomy 12/06/15 with intraoperative cholangiogram Dr. Stark Klein. During that hospitalization she underwent ERCP with biliary stent placement on 12/12/15 by Dr. Wilfrid Lund. She underwent right lower quadrant drain placement by IR with a 12 French pigtail drain catheter, also on 12/17/15. She developed a bacteremia growing out. Ends group streptococci and was seen by infectious disease. The last note, from Dr. Tommy Medal on 12/20/15 reported 10 days of effective antibiotics he recommended IV Rocephin and oral Flagyl through 12/24/15 and then discontinuing antibiotics. She also developed a right pleural effusion and underwent thoracentesis on 12/27/15. Thoracentesis that time yielded 1.8 L of pleural fluid.  She showed good improvement result and was discharged home on 12/28/15.  Pt sitting in recliner chair, in good spirits at time of visit. She reports her appetite has dramatically improved since last visit. Meal completion 75-100%. Pt reports meals usually consist of salad, chicken tenders, or breakfast type foods. She does not like the Prostat supplement and has been refusing it.   Case discussed with RN, who confirms refusal of supplements, however, appetite has improved greatly.   Labs reviewed.  Diet Order:  Diet regular Room service appropriate? Yes; Fluid consistency: Thin  Skin:  Reviewed, no issues  Last BM:  01/10/16  Height:   Ht  Readings from Last 1 Encounters:  01/02/16 5\' 7"  (1.702 m)    Weight:   Wt Readings from Last 1 Encounters:  01/10/16 169 lb 8.5 oz (76.9 kg)    Ideal Body Weight:  61.4 kg  BMI:  Body mass index is 26.55 kg/m.  Estimated Nutritional Needs:   Kcal:  1600-1800  Protein:  80-95 grams  Fluid:  1.6-1.8 L  EDUCATION NEEDS:   Education needs addressed  Berit Raczkowski A. Jimmye Norman, RD, LDN, CDE Pager: [phone] After hours Pager: [phone]

## 2016-01-11 ENCOUNTER — Inpatient Hospital Stay (HOSPITAL_COMMUNITY): Payer: BLUE CROSS/BLUE SHIELD

## 2016-01-11 LAB — CBC
HCT: 31.9 % — ABNORMAL LOW (ref 36.0–46.0)
Hemoglobin: 9.7 g/dL — ABNORMAL LOW (ref 12.0–15.0)
MCH: 27 pg (ref 26.0–34.0)
MCHC: 30.4 g/dL (ref 30.0–36.0)
MCV: 88.9 fL (ref 78.0–100.0)
Platelets: 330 10*3/uL (ref 150–400)
RBC: 3.59 MIL/uL — ABNORMAL LOW (ref 3.87–5.11)
RDW: 15.9 % — ABNORMAL HIGH (ref 11.5–15.5)
WBC: 13.8 10*3/uL — ABNORMAL HIGH (ref 4.0–10.5)

## 2016-01-11 LAB — URINALYSIS, ROUTINE W REFLEX MICROSCOPIC
Bilirubin Urine: NEGATIVE
Glucose, UA: NEGATIVE mg/dL
Hgb urine dipstick: NEGATIVE
Ketones, ur: NEGATIVE mg/dL
Leukocytes, UA: NEGATIVE
Nitrite: NEGATIVE
Protein, ur: NEGATIVE mg/dL
Specific Gravity, Urine: 1.009 (ref 1.005–1.030)
pH: 7.5 (ref 5.0–8.0)

## 2016-01-11 NOTE — Progress Notes (Signed)
Patients are to remain on Enteric precautions for 30 days post positive test for C.difficile. RN notified. Luther Hearing RN BSN Infection Prevention

## 2016-01-11 NOTE — Progress Notes (Signed)
Subjective: She says her breathing is still not good, but she seems pretty comfortable. Low-grade fever recorded in last p.m. 100.1. She is convinced it was 101. Nutrition says she is meeting 90% of her goals. Diarrhea is currently not an issue. She does complain of some discomfort in the right lower quadrant. She also notes her urine is very dark.  Objective: Vital signs in last 24 hours: Temp:  [98.9 F (37.2 C)-100.1 F (37.8 C)] 99 F (37.2 C) (11/03 0445) Pulse Rate:  [104-113] 106 (11/03 0445) Resp:  [17-18] 18 (11/03 0445) BP: (121-133)/(71-77) 133/77 (11/03 0445) SpO2:  [95 %-97 %] 97 % (11/03 0445) Weight:  [76.9 kg (169 lb 8.5 oz)] 76.9 kg (169 lb 8.5 oz) (11/03 0500) Last BM Date: 01/10/16 900 PO - meeting 90% of goal currently Urine 900 Drains 20 ml TM 100.1 1900 hours last PM WBC still 13.8 CXR today shows: Progressive right lower lobe airspace disease and right pleural effusion. Left lung remains clear.   Intake/Output from previous day: 11/02 0701 - 11/03 0700 In: 930 [P.O.:900; I.V.:30] Out: 920 [Urine:900; Drains:20] Intake/Output this shift: No intake/output data recorded.  General appearance: alert, cooperative and no distress Resp: clear to auscultation bilaterally and down some in right base, but over all clear GI: drains OK, complains of some pain RLQ, but no tenderness on palpation.   abdomen is soft, + BS, all sites healing nicely.  No distension.  Lab Results:   Recent Labs  01/10/16 0527 01/11/16 0400  WBC 14.8* 13.8*  HGB 10.3* 9.7*  HCT 33.0* 31.9*  PLT 413* 330    BMET  Recent Labs  01/09/16 0510 01/10/16 1600  NA 140 137  K 3.8 3.6  CL 109 105  CO2 23 24  GLUCOSE 97 113*  BUN 6 7  CREATININE 0.82 1.05*  CALCIUM 7.9* 8.0*   PT/INR No results for input(s): LABPROT, INR in the last 72 hours.   Recent Labs Lab 01/07/16 1100 01/08/16 0405 01/09/16 0510 01/10/16 1600  AST  --  72* 43* 26  ALT  --  57* 47 34  ALKPHOS   --  185* 146* 160*  BILITOT  --  0.9 0.6 0.4  PROT  --  4.6* 4.8* 5.3*  ALBUMIN 2.3* 2.0* 2.0* 2.2*     Lipase     Component Value Date/Time   LIPASE 47 12/16/2015 0241     Studies/Results: Dg Chest 2 View  Result Date: 01/11/2016 CLINICAL DATA:  Pneumonia. EXAM: CHEST  2 VIEW COMPARISON:  01/08/2016 FINDINGS: Progressive right lower lobe airspace disease and small right pleural effusion since the prior study. Mild scarring in the left lung base. No infiltrate or effusion on the left. Negative for heart failure. Right jugular central venous catheter tip in the SVC unchanged. Surgical drains and pigtail catheters overlying the liver. IMPRESSION: Progressive right lower lobe airspace disease and right pleural effusion. Left lung remains clear. Electronically Signed   By: Franchot Gallo M.D.   On: 01/11/2016 09:20    Medications: . clopidogrel  75 mg Oral Daily  . doxepin  200 mg Oral QHS  . famotidine  20 mg Oral BID  . furosemide  20 mg Oral Daily  . gabapentin  600 mg Oral BID  . heparin  5,000 Units Subcutaneous Q8H  . levofloxacin  750 mg Oral Daily  . loratadine  10 mg Oral Daily  . saccharomyces boulardii  250 mg Oral BID  . sertraline  50 mg Oral BID  .  topiramate  100 mg Oral BID  . vancomycin  125 mg Oral Q6H      Chronic kidney disease stage III Creatinine is improved Weakness and debilitation - get PT and OT involved Possible oral candidiasis - Magic mouth wash - resolved Malnutrition - nutrition consult Fluid overload 15.4 lbs up- back on daily home dose - still up 10 lbs   Assessment/Plan S/p laparoscopic cholecystectomy 12/06/15. Readmitted 12/11/15 with biloma,  - negative HIDA 01/08/16 ERCP with biliary stent placement 12/12/15 Dr. Wilfrid Lund; IR drain placement 12/17/15 Uncontrolled diarrhea x 24 hours - C. difficile colitis positive now resolved VirdansStreptococcus Bacteremia - Zosyn/Vancomycin 10 days; Rocephin/Flagyl completed 12/24/15 - Dr. Tommy Medal Recurrent right pleural effusion; Bilateral pleural effusions R>L, s/p right thoracentesis 12/27/15 -m 1.8 L repeat right thoracentesis 12/31/15 1 liter by IR RUL Pneumonia per CT 01/07/16 - Cefepime started 10/30/17Levaquin day 3 PO; CXR today shows progressive RLL air space disease with increased WBC History of TIA on Plavix -restarted  FEN: IV fluids/regular diet ID: Day 12 /29 Oral vancomycin to be completed on 01/28/16 - extended coverage for Pneumonia Rx, 3 days of Cefepime completed 11/1, converted to Levafloxin now day 3/4 today DVT: Heparin    Plan:  Check urine, I have ask FPTS to see her again and continue current Rx.   LOS: 11 days    [name] 01/11/2016 [phone]

## 2016-01-11 NOTE — Consult Note (Signed)
Family Medicine Teaching Service Consult Note Intern Pager: [phone]  Patient name: [name] Medical record number: [id number] Date of birth: December 17, 1950 Age: 65 y.o. Gender: female  Primary Care Provider: Georges Lynch, MD Consultants: Gastroenterology, Surgery Code Status: Full   Pt Overview and Major Events to Date:  9/28: Laproscopic Cholecystectyomy 10/03-10/20: Biliary stent, R LQ drain, thoracentesis 11/01: Transitioned to PO abx for pneumonia  Assessment and Plan: [name] is a 65 y/o F with a past medical history significant HLD, anxiety, depression, CKD stage III, migraines, and CVA s/p laparoscopic cholecysectomy who presented with fever, weakness and diarrhea secondary to C.difficile infection.  #Sepsis, resolved --Will continue to monitor vitals  #Diarrhea, resolved C.diff antigen and toxin on 10/29 was negative. Patient getting vancomycin compounded by our pharmacy.   --Continue vancomycin 125 mg q6 until 14 days after completing Levaquin for pneumonia (day 2 of 4), anticpate completing course on 11/20 --Continue Enteric precautions --Probiotics (10/27>>)  #Pleural effusion, subacute, ongoing Patient had a thoracentesis 10/30 and drained 1L of pleural fluid. Subsequently chest CT showed Right upper and lower lobe pneumonia. Patient was started on antibiotics. WBC increased to 14.8 on 11/02 but has improved to 13.8 as of 11/03. Pt lungs sound improved and pt w/o cough. No SOB or CP. Repeat CXR showed worsening right-sided pneumonia but patient remains afebrile without symptoms of productive cough and without oxygen requirement.  --Continue Levaquin 750 mg QD for 4 days (on day 3 of 4) --Will follow up on pleural analysis  #Post-Thoracotomy Pain Syndrome --Continue Neurontin 600 mg bid --Oxycodone 5-325 Q4 prn  #HTN, stable  This morning BP 132/69 --Continue to hold home Ramipril 2.5 mg daily, can restart after f/u with PCP upon d/c  #History of CVA in  2015, stable With reported residual right sided weakness of right foot and right hand. Was started on Plavix.  --Plavix 75 mg QD  #LE swelling.  With no formal diagnosis of CHF. Last Echo 12/2015 with EF 55-60%.  At home on 20 mg Lasix daily. Patient with some LE extremities edema and given lasix. Patient still has mild edema this morning. --Reassess fluid status --Continue lasix 20 mg po bid  #Hypokalemia, resolved  K+ 3.6 (10/28) --Will continue to replete as needed   --Follow CMET in AM  #Hyperlipidemia  Last lipid panel: Chol 145, TG 49, HDL 67, LDL 68. --Restart home Lipitor 40 mg daily upon d/c  #CKD Stage III, stable   Cr on admission 0.91. Has improved throughout stay 0.63 (10/28). --Avoid nephrotoxic agents --Renally dose medications  #Migraines, stable --Continue home Topamax 100 mg bid  #Anxiety, stable Reports also trouble sleeping.  --Continue home Doxepin 200 mg daily at bedtime  #Depression, stable --Continue home Zoloft 50 mg bid  FEN/GI: regular diet PPx: SCDs, claritin   Disposition: Patients respiratory status remains stable and afebrile. Physical exam reassuring that pneumonia is not progressing despite worsening Xray. Continue Levaquin through 11/4 for pneumonia. F/u with PCP at [name]. If patient develops productive cough, fever >100.48F, or has new oxygen requirement, you can let us know. Otherwise we will again sign off.  Subjective:  No overnight events. Remains stable and afebrile. No oxygen requirement. Patient denies fevers, chills, nausea, dyspnea, chest pain, cough, or abdominal pain. Accompanied by husband.  Objective: Temp:  [98.6 F (37 C)-100.1 F (37.8 C)] 98.6 F (37 C) (11/03 1457) Pulse Rate:  [106-113] 109 (11/03 1457) Resp:  [17-18] 18 (11/03 1457) BP: (114-133)/(71-89) 114/89 (11/03 1457) SpO2:  [95 %-  99 %] 99 % (11/03 1457) Weight:  [169 lb 8.5 oz (76.9 kg)] 169 lb 8.5 oz (76.9 kg) (11/03 0500)  Physical Exam: General:  [name], female lying in hospital bed, in NAD HEENT: EOMI, MMM Cardiovascular: RRR, no MRG, 2+ pulses  Respiratory: mild crackles on right with no wheezing B/L, no cough  Abdomen: obese, soft, NT ND, biliary drain in place covered with dry bandage and green discharge Extremities: warm, well perfused, no edema or tenderness, moves all extremities spontaneously Neuro: [name]  Laboratory:  Recent Labs Lab 01/09/16 0510 01/10/16 0527 01/11/16 0400  WBC 7.9 14.8* 13.8*  HGB 8.9* 10.3* 9.7*  HCT 27.9* 33.0* 31.9*  PLT 344 413* 330    Recent Labs Lab 01/08/16 0405 01/09/16 0510 01/10/16 1600  NA 138 140 137  K 3.9 3.8 3.6  CL 107 109 105  CO2 25 23 24   BUN 5* 6 7  CREATININE 0.81 0.82 1.05*  CALCIUM 7.9* 7.9* 8.0*  PROT 4.6* 4.8* 5.3*  BILITOT 0.9 0.6 0.4  ALKPHOS 185* 146* 160*  ALT 57* 47 34  AST 72* 43* 26  GLUCOSE 100* 97 113*    Imaging/Diagnostic Tests: Dg Chest 2 View  Result Date: 01/11/2016 CLINICAL DATA:  Pneumonia. EXAM: CHEST  2 VIEW COMPARISON:  01/08/2016 FINDINGS: Progressive right lower lobe airspace disease and small right pleural effusion since the prior study. Mild scarring in the left lung base. No infiltrate or effusion on the left. Negative for heart failure. Right jugular central venous catheter tip in the SVC unchanged. Surgical drains and pigtail catheters overlying the liver. IMPRESSION: Progressive right lower lobe airspace disease and right pleural effusion. Left lung remains clear. Electronically Signed   By: Franchot Gallo M.D.   On: 01/11/2016 09:20   Dg Chest 2 View  Result Date: 01/08/2016 CLINICAL DATA:  Chest pain.  Shortness of breath . EXAM: CHEST  2 VIEW COMPARISON:  CT 01/07/2016.  Chest x-ray 01/07/2016 . FINDINGS: Right IJ line noted in stable position. Right upper quadrant drainage catheters in stable position. Mediastinum hilar structures are normal. Mild right base infiltrate with small right pleural effusion. Bilateral pleural  parenchymal thickening again noted consistent scarring. Prior cervical spine fusion . IMPRESSION: 1. Right IJ line in stable position . Right upper quadrant drainage catheters in stable position. 2. Mild right base infiltrate consistent with pneumonia. Bilateral pleural-parenchymal thickening again noted consistent with scarring. Electronically Signed   By: Marcello Moores  Register   On: 01/08/2016 08:00   Ct Chest Wo Contrast  Result Date: 01/07/2016 CLINICAL DATA:  Several recent pleural effusions. Renal insufficiency EXAM: CT CHEST WITHOUT CONTRAST TECHNIQUE: Multidetector CT imaging of the chest was performed following the standard protocol without IV contrast. COMPARISON:  Chest radiograph January 07, 2016 ; CT abdomen and pelvis December 26, 2015 FINDINGS: Cardiovascular: There is no appreciable thoracic aortic aneurysm or dissection. In the visualized great vessels, there is moderate calcification in the proximal left subclavian and right common carotid arteries. There is approximately 50% diameter stenosis at the origin of the left subclavian artery. There are multiple foci of coronary artery calcification. The pericardium is not appreciably thickened. Mediastinum/Nodes: Thyroid appears unremarkable. There are multiple subcentimeter mediastinal lymph nodes without frank adenopathy by size criteria. There is a small hiatal hernia with fluid in the distal half of the esophagus, likely due to chronic reflux change. There is also thickening of the wall of the distal half of the esophagus. Lungs/Pleura: There is a fairly small free-flowing pleural effusion  on the right with minimal free-flowing pleural effusion on the left. There is airspace consolidation throughout much of the right lower lobe as well as in portions of the posterior segment of the right upper lobe. There is mild atelectatic change in the anterior lateral left base regions. Upper Abdomen: In the visualized upper abdomen, there is incomplete  visualization of a drainage catheter adjacent to the liver edge on the right anteriorly, unchanged. A small amount of air is noted in the biliary ductal system which may be due to prior sphincterotomy. There is incomplete visualization of a biliary stent. Visualized upper abdominal structures otherwise appear unremarkable with limited upper abdominal visualization on this examination. Musculoskeletal: There are no blastic or lytic bone lesions. IMPRESSION: Fairly small right pleural effusion with minimal left pleural effusion. Extensive airspace consolidation consistent with pneumonia throughout the right upper lobe as well as in portions of the posterior segment of the right upper lobe. There is mild left base atelectasis. Wall thickening and fluid in the distal half of the esophagus. Suspect chronic reflux esophagitis. No adenopathy. Multiple foci of atherosclerotic calcification. Note that there is calcification in multiple coronary arteries. Incomplete visualization of drainage catheter in the anterior right upper quadrant. Air in the biliary ductal system may be due to prior sphincterotomy. Note that upper abdominal visualization is limited. Electronically Signed   By: Lowella Grip III M.D.   On: 01/07/2016 15:57   Nm Hepatobiliary Including Gb  Result Date: 01/08/2016 CLINICAL DATA:  Bile leak post cholecystectomy on 12/06/2015, post stenting EXAM: NUCLEAR MEDICINE HEPATOBILIARY IMAGING TECHNIQUE: Sequential images of the abdomen were obtained out to 60 minutes following intravenous administration of radiopharmaceutical. RADIOPHARMACEUTICALS:  5.3 mCi Tc-65m  Choletec IV COMPARISON:  12/11/2015 FINDINGS: Normal tracer extraction from bloodstream indicating normal hepatocellular function. Prompt excretion of tracer into biliary tree. Small bowel visualized by 17 minutes. No hepatic retention of tracer. No abnormal accumulation of tracer is seen at the gallbladder fossa, in perihepatic space, or at the  RIGHT pericolic gutter to suggest presence of an active bile leak. IMPRESSION: Patent biliary tree post cholecystectomy. No scintigraphic evidence of bile leak or CBD obstruction. Electronically Signed   By: Lavonia Dana M.D.   On: 01/08/2016 14:19    [place] Bing, DO 01/11/2016, 3:23 PM PGY-1, [place] Intern pager: [phone], text pages welcome

## 2016-01-11 NOTE — Progress Notes (Signed)
Dr. Barry Dienes paged to notify patient states Dr. Creig Hines wanted cdiff sample if loose stool persists after levaquin dose starts. Md states okay to place order.

## 2016-01-12 LAB — CULTURE, BODY FLUID W GRAM STAIN -BOTTLE

## 2016-01-12 LAB — CBC
HCT: 28.6 % — ABNORMAL LOW (ref 36.0–46.0)
Hemoglobin: 9 g/dL — ABNORMAL LOW (ref 12.0–15.0)
MCH: 28.1 pg (ref 26.0–34.0)
MCHC: 31.5 g/dL (ref 30.0–36.0)
MCV: 89.4 fL (ref 78.0–100.0)
Platelets: 250 10*3/uL (ref 150–400)
RBC: 3.2 MIL/uL — ABNORMAL LOW (ref 3.87–5.11)
RDW: 16 % — ABNORMAL HIGH (ref 11.5–15.5)
WBC: 12.3 10*3/uL — ABNORMAL HIGH (ref 4.0–10.5)

## 2016-01-12 LAB — BASIC METABOLIC PANEL
Anion gap: 3 — ABNORMAL LOW (ref 5–15)
BUN: 8 mg/dL (ref 6–20)
CO2: 22 mmol/L (ref 22–32)
Calcium: 8.1 mg/dL — ABNORMAL LOW (ref 8.9–10.3)
Chloride: 107 mmol/L (ref 101–111)
Creatinine, Ser: 0.87 mg/dL (ref 0.44–1.00)
GFR calc Af Amer: >60 mL/min (ref >60)
GFR calc non Af Amer: >60 mL/min (ref >60)
Glucose, Bld: 115 mg/dL — ABNORMAL HIGH (ref 65–99)
Potassium: 3.6 mmol/L (ref 3.5–5.1)
Sodium: 132 mmol/L — ABNORMAL LOW (ref 135–145)

## 2016-01-12 LAB — CULTURE, BODY FLUID-BOTTLE: Culture: NO GROWTH

## 2016-01-12 LAB — C DIFFICILE QUICK SCREEN W PCR REFLEX
C Diff antigen: NEGATIVE
C Diff interpretation: NOT DETECTED
C Diff toxin: NEGATIVE

## 2016-01-12 NOTE — Progress Notes (Signed)
Subjective: No c/o. Wants to know if another cxr will be done. No sob at rest. May get a little sob when almost done with 1 complete loop.   Objective: Vital signs in last 24 hours: Temp:  [98.2 F (36.8 C)-99.3 F (37.4 C)] 99.3 F (37.4 C) (11/04 0627) Pulse Rate:  [109-110] 110 (11/04 0627) Resp:  [17-18] 17 (11/04 0627) BP: (114-128)/(73-89) 128/77 (11/04 0627) SpO2:  [94 %-99 %] 94 % (11/04 0627) Weight:  [74.3 kg (163 lb 12.8 oz)] 74.3 kg (163 lb 12.8 oz) (11/04 0500) Last BM Date: 01/11/16  Intake/Output from previous day: 11/03 0701 - 11/04 0700 In: 1160 [P.O.:1120; I.V.:30] Out: 1501 [Urine:1500; Stool:1] Intake/Output this shift: Total I/O In: -  Out: 25 [Drains:25]  Alert, sitting in chair cta b/l Reg Soft, nt nd A little bile indrain No edema  Lab Results:   Recent Labs  01/11/16 0400 01/12/16 0428  WBC 13.8* 12.3*  HGB 9.7* 9.0*  HCT 31.9* 28.6*  PLT 330 250   BMET  Recent Labs  01/10/16 1600 01/12/16 0428  NA 137 132*  K 3.6 3.6  CL 105 107  CO2 24 22  GLUCOSE 113* 115*  BUN 7 8  CREATININE 1.05* 0.87  CALCIUM 8.0* 8.1*   PT/INR No results for input(s): LABPROT, INR in the last 72 hours. ABG No results for input(s): PHART, HCO3 in the last 72 hours.  Invalid input(s): PCO2, PO2  Studies/Results: Dg Chest 2 View  Result Date: 01/11/2016 CLINICAL DATA:  Pneumonia. EXAM: CHEST  2 VIEW COMPARISON:  01/08/2016 FINDINGS: Progressive right lower lobe airspace disease and small right pleural effusion since the prior study. Mild scarring in the left lung base. No infiltrate or effusion on the left. Negative for heart failure. Right jugular central venous catheter tip in the SVC unchanged. Surgical drains and pigtail catheters overlying the liver. IMPRESSION: Progressive right lower lobe airspace disease and right pleural effusion. Left lung remains clear. Electronically Signed   By: Franchot Gallo M.D.   On: 01/11/2016 09:20     Anti-infectives: Anti-infectives    Start     Dose/Rate Route Frequency Ordered Stop   01/09/16 1300  levofloxacin (LEVAQUIN) tablet 750 mg     750 mg Oral Daily 01/09/16 1223 01/12/16 0936   01/08/16 0000  vancomycin (VANCOCIN) IVPB 1000 mg/200 mL premix  Status:  Discontinued     1,000 mg 200 mL/hr over 60 Minutes Intravenous Every 12 hours 01/07/16 2209 01/09/16 1212   01/07/16 2300  ceFEPIme (MAXIPIME) 2 g in dextrose 5 % 50 mL IVPB  Status:  Discontinued     2 g 100 mL/hr over 30 Minutes Intravenous Every 12 hours 01/07/16 2209 01/09/16 1212   01/07/16 0000  vancomycin (VANCOCIN) 50 mg/mL oral solution     125 mg Oral Every 6 hours 01/07/16 1311 01/14/16 2359   01/02/16 1100  ceFEPIme (MAXIPIME) 2 g in dextrose 5 % 50 mL IVPB  Status:  Discontinued     2 g 100 mL/hr over 30 Minutes Intravenous Every 24 hours 01/01/16 1349 01/02/16 1011   01/01/16 1100  ceFEPIme (MAXIPIME) 2 g in dextrose 5 % 50 mL IVPB  Status:  Discontinued     2 g 100 mL/hr over 30 Minutes Intravenous Every 24 hours 12/31/15 1128 01/01/16 1303   12/31/15 2300  vancomycin (VANCOCIN) IVPB 750 mg/150 ml premix  Status:  Discontinued     750 mg 150 mL/hr over 60 Minutes Intravenous Every 12 hours 12/31/15  1128 01/01/16 1345   12/31/15 1800  vancomycin (VANCOCIN) 50 mg/mL oral solution 125 mg     125 mg Oral Every 6 hours 12/31/15 1731 01/27/16 2359   12/31/15 0945  ceFEPIme (MAXIPIME) 2 g in dextrose 5 % 50 mL IVPB     2 g 100 mL/hr over 30 Minutes Intravenous  Once 12/31/15 0932 12/31/15 1050   12/31/15 0945  vancomycin (VANCOCIN) IVPB 1000 mg/200 mL premix  Status:  Discontinued     1,000 mg 200 mL/hr over 60 Minutes Intravenous  Once 12/31/15 0932 12/31/15 0933   12/31/15 0945  vancomycin (VANCOCIN) 1,500 mg in sodium chloride 0.9 % 500 mL IVPB     1,500 mg 250 mL/hr over 120 Minutes Intravenous  Once 12/31/15 0933 12/31/15 1224      Assessment/Plan: S/p laparoscopic cholecystectomy 12/06/15.  Readmitted 12/11/15 with biloma, - negative HIDA 01/08/16 ERCP with biliary stent placement 12/12/15 Dr. Wilfrid Lund; IR drain placement 12/17/15 Uncontrolled diarrhea x 24 hours - C. difficile colitis positive now resolved VirdansStreptococcus Bacteremia - Zosyn/Vancomycin 10 days; Rocephin/Flagyl completed 12/24/15 - Dr. Tommy Medal Recurrent right pleural effusion; Bilateral pleural effusions R>L, s/p right thoracentesis 12/27/15 -m 1.8 L repeat right thoracentesis 12/31/15 1 liter by IR RUL Pneumonia per CT 01/07/16 - Cefepime started 10/30/17Levaquin day today PO; CXR yesterday shows progressive RLL air space disease with increased WBC; wbc better today; clinically Resp improved.  History of TIA on Plavix -restarted  FEN: KVO iv fluids/regular diet ID: Day 12 /29 Oral vancomycin to be completed on 01/28/16 - extended coverage for Pneumonia Rx, 3 days of Cefepime completed 11/1, converted to Levafloxin now day 4/4 today DVT: Heparin   dispo - repeat CXR in am. As long as resp status is stable and cxr shows no recurrent effusion will dc in am. Will dc central line in am. Will go home on oral vanc   Leighton Ruff. Redmond Pulling, MD, FACS General, Bariatric, & Minimally Invasive Surgery [facility], [place]   LOS: 12 days    Gayland Curry 01/12/2016

## 2016-01-12 NOTE — Progress Notes (Signed)
I Had a long discussion with the patient's daughter-in-law. She raised concerns of the patient's previous hospital stays as well as complications. She is concerned of the miscommunication by MDs. I try to clarify the patient's course and complications. She voices concern over several of the visiting physicians bedside manner.  It appears that the main concern is having her mother-in-law return to normal activity  At some point our of the hospital and she feels that she is a long way from that currently. I assured her that after overcoming her complications which appear to be resolving, and with time she will likely return to her previous activities. I feel that all questions were answered. She does appear righfully frustrated secondary to her care on her multiple visits to the hospital.

## 2016-01-13 ENCOUNTER — Inpatient Hospital Stay (HOSPITAL_COMMUNITY): Payer: BLUE CROSS/BLUE SHIELD

## 2016-01-13 NOTE — Progress Notes (Signed)
Subjective: Some right mid back discomfort. Some discomfort around drains. No n/v. Ambulating. No SOB at rest. May get a little winded when almost complete 1 loop.   Objective: Vital signs in last 24 hours: Temp:  [98.2 F (36.8 C)-99.3 F (37.4 C)] 98.8 F (37.1 C) (11/05 0500) Pulse Rate:  [100-105] 101 (11/05 0500) Resp:  [17-18] 17 (11/05 0500) BP: (108-127)/(63-70) 108/70 (11/05 0500) SpO2:  [95 %-97 %] 95 % (11/05 0500) Weight:  [75.4 kg (166 lb 3.6 oz)] 75.4 kg (166 lb 3.6 oz) (11/05 0500) Last BM Date: 01/12/16  Intake/Output from previous day: 11/04 0701 - 11/05 0700 In: 730 [P.O.:720] Out: 975 [Urine:950; Drains:25] Intake/Output this shift: No intake/output data recorded.  Alert, sitting in chair CTA b/l Reg Soft, nd, nt. Drains intact/secure/no cellulitis No edema  Lab Results:   Recent Labs  01/11/16 0400 01/12/16 0428  WBC 13.8* 12.3*  HGB 9.7* 9.0*  HCT 31.9* 28.6*  PLT 330 250   BMET  Recent Labs  01/10/16 1600 01/12/16 0428  NA 137 132*  K 3.6 3.6  CL 105 107  CO2 24 22  GLUCOSE 113* 115*  BUN 7 8  CREATININE 1.05* 0.87  CALCIUM 8.0* 8.1*   PT/INR No results for input(s): LABPROT, INR in the last 72 hours. ABG No results for input(s): PHART, HCO3 in the last 72 hours.  Invalid input(s): PCO2, PO2  Studies/Results: Dg Chest 2 View  Result Date: 01/13/2016 CLINICAL DATA:  Pneumonia, shortness of breath and cough for 1 week, former smoker, prior thoracentesis twice in October 2017 EXAM: CHEST  2 VIEW COMPARISON:  01/11/2016 FINDINGS: RIGHT jugular central venous catheter tip projecting over SVC. Normal heart size, mediastinal contours, and pulmonary vascularity. Stable small moderate RIGHT pleural effusion with basilar atelectasis. Underlying emphysematous and bronchitic changes. Scarring at lateral mid to lower LEFT lung unchanged. No acute infiltrate or pneumothorax. Bones demineralized. Pigtail drains RIGHT upper quadrant  unchanged. IMPRESSION: COPD changes with persistent RIGHT pleural effusion and basilar atelectasis. Lateral LEFT lung base scarring. Electronically Signed   By: Lavonia Dana M.D.   On: 01/13/2016 09:08    Anti-infectives: Anti-infectives    Start     Dose/Rate Route Frequency Ordered Stop   01/09/16 1300  levofloxacin (LEVAQUIN) tablet 750 mg     750 mg Oral Daily 01/09/16 1223 01/12/16 0936   01/08/16 0000  vancomycin (VANCOCIN) IVPB 1000 mg/200 mL premix  Status:  Discontinued     1,000 mg 200 mL/hr over 60 Minutes Intravenous Every 12 hours 01/07/16 2209 01/09/16 1212   01/07/16 2300  ceFEPIme (MAXIPIME) 2 g in dextrose 5 % 50 mL IVPB  Status:  Discontinued     2 g 100 mL/hr over 30 Minutes Intravenous Every 12 hours 01/07/16 2209 01/09/16 1212   01/07/16 0000  vancomycin (VANCOCIN) 50 mg/mL oral solution     125 mg Oral Every 6 hours 01/07/16 1311 01/14/16 2359   01/02/16 1100  ceFEPIme (MAXIPIME) 2 g in dextrose 5 % 50 mL IVPB  Status:  Discontinued     2 g 100 mL/hr over 30 Minutes Intravenous Every 24 hours 01/01/16 1349 01/02/16 1011   01/01/16 1100  ceFEPIme (MAXIPIME) 2 g in dextrose 5 % 50 mL IVPB  Status:  Discontinued     2 g 100 mL/hr over 30 Minutes Intravenous Every 24 hours 12/31/15 1128 01/01/16 1303   12/31/15 2300  vancomycin (VANCOCIN) IVPB 750 mg/150 ml premix  Status:  Discontinued  750 mg 150 mL/hr over 60 Minutes Intravenous Every 12 hours 12/31/15 1128 01/01/16 1345   12/31/15 1800  vancomycin (VANCOCIN) 50 mg/mL oral solution 125 mg     125 mg Oral Every 6 hours 12/31/15 1731 01/27/16 2359   12/31/15 0945  ceFEPIme (MAXIPIME) 2 g in dextrose 5 % 50 mL IVPB     2 g 100 mL/hr over 30 Minutes Intravenous  Once 12/31/15 0932 12/31/15 1050   12/31/15 0945  vancomycin (VANCOCIN) IVPB 1000 mg/200 mL premix  Status:  Discontinued     1,000 mg 200 mL/hr over 60 Minutes Intravenous  Once 12/31/15 0932 12/31/15 0933   12/31/15 0945  vancomycin (VANCOCIN) 1,500 mg in  sodium chloride 0.9 % 500 mL IVPB     1,500 mg 250 mL/hr over 120 Minutes Intravenous  Once 12/31/15 0933 12/31/15 1224      Assessment/Plan: S/p laparoscopic cholecystectomy 12/06/15. Readmitted 12/11/15 with biloma, - negative HIDA 01/08/16 ERCP with biliary stent placement 12/12/15 Dr. Wilfrid Lund; IR drain placement 12/17/15 Uncontrolled diarrhea x 24 hours - C. difficile colitis positive now resolved VirdansStreptococcus Bacteremia - Zosyn/Vancomycin 10 days; Rocephin/Flagyl completed 12/24/15 - Dr. Tommy Medal Recurrent right pleural effusion; Bilateral pleural effusions R>L, s/p right thoracentesis 12/27/15 -m 1.8 L repeat right thoracentesis 12/31/15 1 liter by IR RUL Pneumonia per CT 01/07/16 - Cefepime started 10/30/17Levaquin treatment completed 11/4; CXR 11/3   shows progressive RLL air space disease with increased WBC; wbc better today; clinically Resp improved.  History of TIA on Plavix -restarted  FEN: KVO iv fluids/regular diet ID: Day 12 /29Oral vancomycin to be completed on 01/28/16 - extended coverage for Pneumonia Rx, 3 days of Cefepime completed 11/1, converted to Levafloxin completed on 11/4 DVT: Heparin   dispo - repeat CXR this am is stable - stable small effusion with some atelectasis, she is pulling 1400 on IS, requiring no O2, no desat, no cough. No fever. So I don't think I would re-tap her lung.   Will dc central line today. Will go home on oral vanc   Her drains for bile leak have consistently put out <30cc/day since 11/1. HIDA on10/31 showed no bile leak. Will discuss with Dr Barry Dienes about removing drains prior to dc on Monday.   Leighton Ruff. Redmond Pulling, MD, FACS General, Bariatric, & Minimally Invasive Surgery [facility], [place]   LOS: 13 days    Gayland Curry 01/13/2016

## 2016-01-14 ENCOUNTER — Other Ambulatory Visit: Payer: Self-pay | Admitting: Family Medicine

## 2016-01-14 DIAGNOSIS — J189 Pneumonia, unspecified organism: Secondary | ICD-10-CM

## 2016-01-14 MED ORDER — VANCOMYCIN 50 MG/ML ORAL SOLUTION: 125.0000 mg | Freq: Four times a day (QID) | ORAL | 0 refills | Status: DC

## 2016-01-14 MED FILL — VANCOMYCIN HCL 125 MG CAP: 125 | 14 days supply | Qty: 56 | Fill #0

## 2016-01-14 NOTE — Discharge Instructions (Signed)
Clostridium Difficile Infection Clostridium difficile (C. difficile or C. diff) is a germ found in the intestines. C. difficile infection can happen after taking antibiotic medicines. Antiobiotics may cause the C. difficile to grow out of control and make a toxin that causes the infection. C. difficile infection can cause watery poop (diarrhea) or severe disease. HOME CARE  Drink enough fluids to keep your pee (urine) clear or pale yellow. Avoid milk, caffeine, and alcohol.  Ask your doctor how to replace the body fluids you lost (rehydrate).  Eat small meals often. Avoid eating large meals.  Take your antibiotics as told. Finish them even if you start to feel better.  Do not  take medicines that help watery poop. These medicines may stop you from healing as fast or cause problems.  Wash your hands after using the bathroom and before touching food. Make sure people who live with you wash their hands often.  Clean all surfaces in your home. Use a product that has chlorine bleach in it. GET HELP IF:  Your watery poop lasts longer than expected.  Your watery poop comes back after you finish your antibiotic medicine.  You feel very dry or thirsty (dehydrated).  You have a fever. GET HELP RIGHT AWAY IF:   You have more stomach pain or tenderness.  You have blood in your poop (stool). Your poop may look black and tar-like.  You cannot eat or drink without throwing up (vomiting).   This information is not intended to replace advice given to you by your health care provider. Make sure you discuss any questions you have with your health care provider.   Document Released: 12/22/2008 Document Revised: 03/17/2014 Document Reviewed: 08/28/2014 Elsevier Interactive Patient Education 2016 Elsevier Inc[employer]   Percutaneous Abscess Drain, Care After RECORD DRAINAGE EACH DAY AND KEEP A RECORD.  BRING THIS WITH YOU TO ALL APPOINTMENTS WITH DR. BYERLY AND DR. DANIS  Flush each drain with 5 ml of  sterile saline twice a day.  Refer to this sheet in the next few weeks. These instructions provide you with information on caring for yourself after your procedure. Your health care provider may also give you more specific instructions. Your treatment has been planned according to current medical practices, but problems sometimes occur. Call your health care provider if you have any problems or questions after your procedure. WHAT TO EXPECT AFTER THE PROCEDURE After your procedure, it is typical to have the following:   A small amount of discomfort in the area where the drainage tube was placed.  A small amount of bruising around the area where the drainage tube was placed.  Sleepiness and fatigue for the rest of the day from the medicines used. HOME CARE INSTRUCTIONS  Rest at home for 1-2 days following your procedure or as directed by your health care provider.  If you go home right after the procedure, plan to have someone with you for 24 hours.  Do not take a bathor shower for 24 hours after your procedure.  Take medicines only as directed by your health care provider. Ask your health care provider when you can resume taking any normal medicines.  Change bandages (dressings) as directed.   You may be told to record the amount of drainage from the bag every time you empty it. Follow your health care provider's directions for emptying the bag. Write down the amount of drainage, the date, and the time you emptied it.  Call your health care provider when the drain  is putting out less than 10 mL of drainage per day for 2-3 days in a row or as directed by your health care provider.  Follow your health care provider's instructions for cleaning the drainage tube. You may need to clean the tube every day so that it does not clog. SEEK MEDICAL CARE IF:  You have increased bleeding (more than a small spot) from the site where the drainage tube was placed.  You have redness, swelling, or  increasing pain around the site where the drainage tube was placed.  You notice a discharge or bad smell coming from the site where the drainage tube was placed.  You have a fever or chills.  You have pain that is not helped by medicine.  SEEK IMMEDIATE MEDICAL CARE IF:  There is leakage around the drainage tube.  The drainage tube pulls out.  You suddenly stop having drainage from the tube.  You suddenly have blood in the drainage fluid.  You become dizzy or faint.  You develop a rash.   You have nausea or vomiting.  You have difficulty breathing, feel short of breath, or feel faint.   You develop chest pain.  You have problems with your speech or vision.  You have trouble balancing or moving your arms or legs.   This information is not intended to replace advice given to you by your health care provider. Make sure you discuss any questions you have with your health care provider.   Document Released: 07/11/2013 Document Revised: 12/13/2013 Document Reviewed: 07/11/2013 Elsevier Interactive Patient Education [employer].

## 2016-01-14 NOTE — Progress Notes (Signed)
Subjective: She is doing fine this a.m. Upset we are not pulling her drains before discharge. She reports her husband is doing the flushes at home.  Objective: Vital signs in last 24 hours: Temp:  [98.2 F (36.8 C)-98.8 F (37.1 C)] 98.8 F (37.1 C) (11/06 0510) Pulse Rate:  [95-99] 96 (11/06 0510) Resp:  [18] 18 (11/06 0510) BP: (117-120)/(67-69) 118/69 (11/06 0510) SpO2:  [94 %-96 %] 94 % (11/06 0510) Weight:  [75.4 kg (166 lb 3.6 oz)] 75.4 kg (166 lb 3.6 oz) (11/06 0510) Last BM Date: 01/13/16 NOthing PO recorded Drains ) ml recorded Urine 1050 recorded Afebrile, VSS WBC continues to improve CXR yesterday:  COPD changes with persistent RIGHT pleural effusion and basilar Atelectasis, stable   Intake/Output from previous day: 11/05 0701 - 11/06 0700 In: -  Out: 1050 [Urine:1050] Intake/Output this shift: No intake/output data recorded.  General appearance: alert, cooperative and no distress Resp: Clear, slightly decreased right base. No wheezing. GI: soft, non-tender; bowel sounds normal; no masses,  no organomegaly and She still thinks her abdomen is swollen drain sites are clean. Drains recently flushed, fluid in the drain is slightly cloudy.  Lower extremities: Edema is much improved.  Lab Results:   Recent Labs  01/12/16 0428  WBC 12.3*  HGB 9.0*  HCT 28.6*  PLT 250    BMET  Recent Labs  01/12/16 0428  NA 132*  K 3.6  CL 107  CO2 22  GLUCOSE 115*  BUN 8  CREATININE 0.87  CALCIUM 8.1*   PT/INR No results for input(s): LABPROT, INR in the last 72 hours.   Recent Labs Lab 01/07/16 1100 01/08/16 0405 01/09/16 0510 01/10/16 1600  AST  --  72* 43* 26  ALT  --  57* 47 34  ALKPHOS  --  185* 146* 160*  BILITOT  --  0.9 0.6 0.4  PROT  --  4.6* 4.8* 5.3*  ALBUMIN 2.3* 2.0* 2.0* 2.2*     Lipase     Component Value Date/Time   LIPASE 47 12/16/2015 0241     Studies/Results: Dg Chest 2 View  Result Date: 01/13/2016 CLINICAL DATA:   Pneumonia, shortness of breath and cough for 1 week, former smoker, prior thoracentesis twice in October 2017 EXAM: CHEST  2 VIEW COMPARISON:  01/11/2016 FINDINGS: RIGHT jugular central venous catheter tip projecting over SVC. Normal heart size, mediastinal contours, and pulmonary vascularity. Stable small moderate RIGHT pleural effusion with basilar atelectasis. Underlying emphysematous and bronchitic changes. Scarring at lateral mid to lower LEFT lung unchanged. No acute infiltrate or pneumothorax. Bones demineralized. Pigtail drains RIGHT upper quadrant unchanged. IMPRESSION: COPD changes with persistent RIGHT pleural effusion and basilar atelectasis. Lateral LEFT lung base scarring. Electronically Signed   By: Lavonia Dana M.D.   On: 01/13/2016 09:08   Prior to Admission medications   Medication Sig Start Date End Date Taking? Authorizing Provider  doxepin (SINEQUAN) 100 MG capsule Take 2 capsules (200 mg total) by mouth at bedtime. 10/15/14  Yes Kathrynn Ducking, MD  esomeprazole (NEXIUM) 20 MG capsule Take 20 mg by mouth daily.   Yes Historical Provider, MD  furosemide (LASIX) 20 MG tablet Take 20 mg by mouth daily.  05/09/14  Yes Historical Provider, MD  gabapentin (NEURONTIN) 600 MG tablet Take 1 tablet (600 mg total) by mouth 2 (two) times daily. 12/05/15  Yes Charlett Blake, MD  lidocaine (LIDODERM) 5 % Place 2 patches onto the skin daily. Remove & Discard patch within 12 hours  or as directed by MD 11/28/15  Yes Charlett Blake, MD  meclizine (ANTIVERT) 25 MG tablet Take 1 tablet (25 mg total) by mouth 2 (two) times daily as needed for dizziness. 07/16/15  Yes Elberta Leatherwood, MD  sertraline (ZOLOFT) 50 MG tablet TAKE 1 TABLET (50 MG TOTAL) BY MOUTH 2 (TWO) TIMES DAILY. 12/13/15  Yes Elberta Leatherwood, MD  tiZANidine (ZANAFLEX) 2 MG tablet Take 1 tablet (2 mg total) by mouth 3 (three) times daily. Patient taking differently: Take 2-4 mg by mouth See admin instructions. Take 2 mg by mouth in the morning  and take 4 mg by mouth in the evening 09/12/15  Yes Ward Givens, NP  topiramate (TOPAMAX) 100 MG tablet TAKE 1 TABLET TWICE A DAY Patient taking differently: Take 100 mg by mouth twice daily 05/07/15  Yes Kathrynn Ducking, MD  acetaminophen (TYLENOL) 325 MG tablet Take 2 tablets (650 mg total) by mouth every 6 (six) hours as needed for mild pain (or temp > 100). 01/07/16   Earnstine Regal, PA-C  atorvastatin (LIPITOR) 40 MG tablet Take 1 tablet (40 mg total) by mouth daily. Patient not taking: Reported on 12/31/2015 01/04/15   Elberta Leatherwood, MD  clopidogrel (PLAVIX) 75 MG tablet TAKE 1 TABLET BY MOUTH EVERY DAY Patient not taking: Reported on 12/31/2015 09/04/15   Elberta Leatherwood, MD  ondansetron (ZOFRAN-ODT) 4 MG disintegrating tablet Take 1 tablet (4 mg total) by mouth every 6 (six) hours as needed for nausea or vomiting. Patient not taking: Reported on 12/31/2015 12/28/15   Jerrye Beavers, PA-C  oxyCODONE (OXY IR/ROXICODONE) 5 MG immediate release tablet Take 1-2 tablets (5-10 mg total) by mouth every 4 (four) hours as needed for moderate pain. Patient not taking: Reported on 12/31/2015 12/08/15   Jerrye Beavers, PA-C  ramipril (ALTACE) 2.5 MG capsule Do not take this till you go back to the Clinic and they ask you to restart it. 01/07/16   Earnstine Regal, PA-C  saccharomyces boulardii (FLORASTOR) 250 MG capsule You can buy over-the-counter, and take daily for the next 30 days. 01/07/16   Earnstine Regal, PA-C  sodium chloride 0.9 % injection Flush drain with 96mL twice daily Patient not taking: Reported on 12/31/2015 12/28/15   Jerrye Beavers, PA-C  vancomycin (VANCOCIN) 50 mg/mL oral solution Take 2.5 mLs (125 mg total) by mouth every 6 (six) hours. 01/07/16 01/14/16  Earnstine Regal, PA-C     Medications: . clopidogrel  75 mg Oral Daily  . doxepin  200 mg Oral QHS  . famotidine  20 mg Oral BID  . furosemide  20 mg Oral Daily  . gabapentin  600 mg Oral BID  . heparin  5,000 Units  Subcutaneous Q8H  . loratadine  10 mg Oral Daily  . saccharomyces boulardii  250 mg Oral BID  . sertraline  50 mg Oral BID  . topiramate  100 mg Oral BID  . vancomycin  125 mg Oral Q6H      Assessment/Plan S/p laparoscopic cholecystectomy 12/06/15. Readmitted 12/11/15 with biloma, - negative HIDA 01/08/16 ERCP with biliary stent placement 12/12/15 Dr. Wilfrid Lund; IR drain placement 12/17/15 Uncontrolled diarrhea x 24 hours - C. difficile colitis positive now resolved VirdansStreptococcus Bacteremia - Zosyn/Vancomycin 10 days; Rocephin/Flagyl completed 12/24/15 - Dr. Tommy Medal Recurrent right pleural effusion; Bilateral pleural effusions R>L, s/p right thoracentesis 12/27/15 -m 1.8 L repeat right thoracentesis 12/31/15 1 liter by IR RUL Pneumonia per CT 01/07/16 - Cefepime started 10/30/17Levaquin treatment  completed 11/4; CXR 11/3  shows progressive RLL air space disease with increased WBC; wbc better today; clinically Resp improved.  History of TIA on Plavix -restarted  FEN: KVO iv fluids/regular diet ID: Day 12 /29Oral vancomycin to be completed on 01/28/16 - extended coverage for Pneumonia Rx, 3 days of Cefepime completed 11/1, converted to Levafloxin completed on 11/4 DVT: Heparin    Plan:  Aim for discharge today, WBC slowly improving.  Breathing stable. I have asked case management to see and assist her in getting oral vancomycin here through our outpatient pharmacy. Assist with any additional discharge home needs. She was discharged from physical therapy on 01/08/2016. She was discharged from occupational therapy on 01/09/16 home recommendation was a tub/shower seat.     LOS: 14 days    [name] 01/14/2016 [phone]

## 2016-01-14 NOTE — Care Management Note (Addendum)
Case Management Note  Patient Details  Name: [name] MRN: [id number] Date of Birth: Jan 04, 1951  Subjective/Objective:                    Action/Plan:  Spoke with Gerald Stabs at [facility] , no co pay for Vancomycin , for 50 NS flushes co pay $17.50. Patient aware.  Patient not wanting home health RN at present states her and her husband are able to care for drain.   Called Vancomycin in to [facility] , waiting call back with co pay .  Expected Discharge Date:                  Expected Discharge Plan:  Home/Self Care  In-House Referral:     Discharge planning Services  CM Consult  Post Acute Care Choice:    Choice offered to:  Patient  DME Arranged:  Shower stool DME Agency:  [place]:  Patient Refused [facility] Agency:     Status of Service:  In process, will continue to follow  If discussed at Long Length of Stay Meetings, dates discussed:    Additional Comments:  Marilu Favre, RN 01/14/2016, 10:14 AM

## 2016-01-14 NOTE — Progress Notes (Signed)
[name] to be D/C'd  per MD order. Discussed with the patient and all questions fully answered.  VSS, Skin clean, dry and intact without evidence of skin break down, no evidence of skin tears noted.  IV catheter discontinued intact. Site without signs and symptoms of complications. Dressing and pressure applied.  An After Visit Summary was printed and given to the patient. Patient received prescription.  D/c education completed with patient/family including follow up instructions, medication list, d/c activities limitations if indicated, with other d/c instructions as indicated by MD - patient able to verbalize understanding, all questions fully answered.   Patient instructed to return to ED, call 911, or call MD for any changes in condition.   Patient to be escorted via [place], and D/C home via private auto.

## 2016-01-18 ENCOUNTER — Telehealth: Payer: Self-pay | Admitting: Gastroenterology

## 2016-01-18 NOTE — Telephone Encounter (Signed)
Gregary Signs,    I did an ERCP and placed a biliary stent for a bile leak on this patient on October 4th.  The stent needs to be removed in the next two weeks, and the procedure must be done in the [facility] endoscopy unit.  I believe I will be there next Tuesday the 14th and the following Monday the 20th.  It must be one of those two days.  Please call her and make the arrangements. Thanks  - HD

## 2016-01-21 ENCOUNTER — Other Ambulatory Visit: Payer: Self-pay

## 2016-01-21 DIAGNOSIS — K839 Disease of biliary tract, unspecified: Secondary | ICD-10-CM

## 2016-01-21 NOTE — Telephone Encounter (Signed)
Patient mailed letter with prep instructions, including Plavix recommendations from Dr. Loletha Carrow.

## 2016-01-21 NOTE — Telephone Encounter (Signed)
If her doctor wants her to resume plavix immediately after the 11/17 visit, then she can stay on it for the EGD.  If they feel it can wait until after the 11/20 EGD, so much the better.

## 2016-01-21 NOTE — Telephone Encounter (Signed)
Patient is scheduled for biliary stent removal on 11/20 at 10:30. Patient currently has Rx for Plavix, she has not started this yet. She has appointment with her PCP on 11/17 to discuss starting this. Asked that she let MD know of her procedure on 11/20. Please advise.

## 2016-01-21 NOTE — Progress Notes (Signed)
Letter mailed

## 2016-01-25 ENCOUNTER — Inpatient Hospital Stay: Payer: Self-pay | Admitting: Family Medicine

## 2016-01-25 ENCOUNTER — Ambulatory Visit (INDEPENDENT_AMBULATORY_CARE_PROVIDER_SITE_OTHER): Payer: BLUE CROSS/BLUE SHIELD | Admitting: Family Medicine

## 2016-01-25 ENCOUNTER — Telehealth: Payer: Self-pay | Admitting: Neurology

## 2016-01-25 ENCOUNTER — Encounter: Payer: Self-pay | Admitting: Family Medicine

## 2016-01-25 ENCOUNTER — Encounter: Payer: Self-pay | Admitting: Neurology

## 2016-01-25 ENCOUNTER — Ambulatory Visit (INDEPENDENT_AMBULATORY_CARE_PROVIDER_SITE_OTHER): Payer: BLUE CROSS/BLUE SHIELD | Admitting: Neurology

## 2016-01-25 VITALS — BP 112/74 | HR 93 | Ht 67.0 in | Wt 159.8 lb

## 2016-01-25 VITALS — BP 116/78 | HR 105 | Temp 97.8°F | Wt 160.2 lb

## 2016-01-25 DIAGNOSIS — F4323 Adjustment disorder with mixed anxiety and depressed mood: Secondary | ICD-10-CM | POA: Diagnosis not present

## 2016-01-25 DIAGNOSIS — Z23 Encounter for immunization: Secondary | ICD-10-CM

## 2016-01-25 DIAGNOSIS — Z09 Encounter for follow-up examination after completed treatment for conditions other than malignant neoplasm: Secondary | ICD-10-CM | POA: Diagnosis not present

## 2016-01-25 DIAGNOSIS — G43711 Chronic migraine without aura, intractable, with status migrainosus: Secondary | ICD-10-CM

## 2016-01-25 NOTE — Telephone Encounter (Signed)
Pt needs next botox appt scheduled.

## 2016-01-25 NOTE — Assessment & Plan Note (Signed)
Patient had a PHQ-2 today and scored a 1, over states that her mood is a bit down from prior to all of her admissions.  Previously she was on 75 mg Zoloft daily and had to stop this medication during her hospitalizations.  She has been on this medication again since her discharge which was about 3 weeks ago.  She is beginning to feel better about everything that has happened and feels optimistic that her health will remain well and she can move forward.  - Continue Zoloft 75 mg daily follow-up in one month to assess mood

## 2016-01-25 NOTE — Assessment & Plan Note (Addendum)
Patient doing much better since being discharged from the hospital.  Had her biliary stent removed this past Monday and is set to finish her course of vancomycin and have her biliary stent removed on 01/28/2016.  Abdominal pain and diarrhea have resolved and she denies any shortness of breath.  She does have very mild crackles in the bilateral bases but is satting 97% on room air and has trace to 1+ pitting edema lower extremities.  She was seen by her nephrologist 2 days ago who checked labs and told her to continue with 20 mg Lasix. Last documented weight was 166 pounds on 01/13/2016 and today's weight is 160 pounds.  - We will review chart when notes become available from gastroenterology and nephrology - Continue current regimen of Lasix - Recommended patient follow-up in one month to reassess fluid status

## 2016-01-25 NOTE — Procedures (Signed)
     BOTOX PROCEDURE NOTE FOR MIGRAINE HEADACHE   HISTORY: [name] is a 65 year old patient with a history of chronic daily headaches. The patient has come in for her second Botox injection. The first injection had good benefit for about 3 weeks, and in the patient went back to having daily headaches. She tolerated the Botox otherwise fairly well.   Description of procedure:  The patient was placed in a sitting position. The standard protocol was used for Botox as follows, with 5 units of Botox injected at each site:   -Procerus muscle, midline injection  -Corrugator muscle, bilateral injection  -Frontalis muscle, bilateral injection, with 2 sites each side, medial injection was performed in the upper one third of the frontalis muscle, in the region vertical from the medial inferior edge of the superior orbital rim. The lateral injection was again in the upper one third of the forehead vertically above the lateral limbus of the cornea, 1.5 cm lateral to the medial injection site.  -Temporalis muscle injection, 4 sites, bilaterally. The first injection was 3 cm above the tragus of the ear, second injection site was 1.5 cm to 3 cm up from the first injection site in line with the tragus of the ear. The third injection site was 1.5-3 cm forward between the first 2 injection sites. The fourth injection site was 1.5 cm posterior to the second injection site.  -Occipitalis muscle injection, 3 sites, bilaterally. The first injection was done one half way between the occipital protuberance and the tip of the mastoid process behind the ear. The second injection site was done lateral and superior to the first, 1 fingerbreadth from the first injection. The third injection site was 1 fingerbreadth superiorly and medially from the first injection site.  -Cervical paraspinal muscle injection, 2 sites, bilateral, the first injection site was 1 cm from the midline of the cervical spine, 3 cm inferior  to the lower border of the occipital protuberance. The second injection site was 1.5 cm superiorly and laterally to the first injection site.  -Trapezius muscle injection was performed at 3 sites, bilaterally. The first injection site was in the upper trapezius muscle halfway between the inflection point of the neck, and the acromion. The second injection site was one half way between the acromion and the first injection site. The third injection was done between the first injection site and the inflection point of the neck.   A 200 unit bottle of Botox was used, 155 units were injected, the rest of the Botox was wasted. The patient tolerated the procedure well, there were no complications of the above procedure.  Botox NDC [id number] Lot number [id number] Expiration date June 2020

## 2016-01-25 NOTE — Progress Notes (Signed)
Subjective:  [name] is a 65 y.o. female who presents to the [facility] today for post hospital follow up  HPI:  Hospital follow up: Patient recently underwent cholecystectomy on 12/06/2015 and required further hospitalization from 12/11/2015 2 12/28/2015 for a biloma.  She had an ERCP with biliary stent placed, and further required interventional radiology to place a right lower quadrant drain.  During this admission she developed bacteremia and was placed on ceftriaxone and Flagyl also developed a right sided pleural effusion requiring thoracentesis.  She was discharged home on 12/28/2015 presented again on 12/31/2015 with weakness diarrhea and right-sided pain and shortness of breath and diagnosed with C. difficile colitis.  This admission was further complicated by a another right-sided pleural effusion that required thoracentesis.  She will be finishing up her course of vancomycin on 01/28/2016 and will have an ERCP to remove her biliary stents that same day.  She had her drains removed this past Monday.    Mood: She has diagnosis of adjustment disorder with mixed anxiety and depressed mood and takes 75 mg Zoloft daily.  She was taken off this medication during her recent admissions.  States that overall her mood has been a little better over the past week or so.  She will be taking her grandson to the movies today and is very excited.  PHQ-2 was calculated to be a 1 today.   PMH: C. difficile colitis Tobacco use: Former smoker Medication: reviewed and updated ROS: see HPI   Objective:  Physical Exam: BP 116/78 (BP Location: Left Arm, Patient Position: Sitting, Cuff Size: Normal)   Pulse (!) 105   Temp 97.8 F (36.6 C) (Oral)   Wt 160 lb 3.2 oz (72.7 kg)   SpO2 97%   BMI 25.09 kg/m   Gen: 65 year old female in NAD, resting comfortably CV: RRR with no murmurs appreciated Pulm: NWOB, CTAB with faint crackles in bilateral bases without wheezes, or rhonchi GI: Normal bowel sounds  present. Soft, Nontender, Nondistended. MSK: no edema, cyanosis, or clubbing noted Extremities: Trace to 1+ edema in the ankles bilaterally to mid shins Skin: warm, dry Neuro: grossly normal, moves all extremities Psych: Normal affect and thought content  No results found for this or any previous visit (from the past 72 hour(s)).   Assessment/Plan:  Hospital discharge follow-up Patient doing much better since being discharged from the hospital.  Had her biliary stent removed this past Monday and is set to finish her course of vancomycin and have her biliary stent removed on 01/28/2016.  Abdominal pain and diarrhea have resolved and she denies any shortness of breath.  She does have very mild crackles in the bilateral bases but is satting 97% on room air and has trace to 1+ pitting edema lower extremities.  She was seen by her nephrologist 2 days ago who checked labs and told her to continue with 20 mg Lasix. Last documented weight was 166 pounds on 01/13/2016 and today's weight is 160 pounds.  - We will review chart when notes become available from gastroenterology and nephrology - Continue current regimen of Lasix - Recommended patient follow-up in one month to reassess fluid status  Adjustment disorder with mixed anxiety and depressed mood Patient had a PHQ-2 today and scored a 1, over states that her mood is a bit down from prior to all of her admissions.  Previously she was on 75 mg Zoloft daily and had to stop this medication during her hospitalizations.  She has been on  this medication again since her discharge which was about 3 weeks ago.  She is beginning to feel better about everything that has happened and feels optimistic that her health will remain well and she can move forward.  - Continue Zoloft 75 mg daily follow-up in one month to assess mood

## 2016-01-25 NOTE — Patient Instructions (Addendum)
[name], you were seen today for hospital follow-up and I think you're doing much better.  Your abdominal pain has resolved and you will be finishing up vancomycin on Monday and also getting your biliary stent out that day.  I did hear some crackles in the bottom of both of your lungs, however your oxygen levels were 97% which is great and you're denying any shortness of breath and you have very mild swelling in her legs.    I would like to see back in 1 month to talk about mood and for another lung exam to listen for crackles.  Very nice seeing you, William Laske L. Rosalyn Gess, [facility] Resident PGY-1 01/25/2016 11:04 AM

## 2016-01-25 NOTE — Progress Notes (Signed)
Botox 100 units/vial x 2 from specialty pharmacy [facility] [phone] Lot No [id number]  Exp 06 2020

## 2016-01-28 ENCOUNTER — Ambulatory Visit (HOSPITAL_COMMUNITY): Payer: BLUE CROSS/BLUE SHIELD | Admitting: Anesthesiology

## 2016-01-28 ENCOUNTER — Encounter (HOSPITAL_COMMUNITY)
Admission: RE | Disposition: A | Discharge status: Home / Self Care | Payer: Self-pay | Source: Ambulatory Visit | Attending: Gastroenterology

## 2016-01-28 ENCOUNTER — Ambulatory Visit (HOSPITAL_COMMUNITY)
Admission: RE | Admit: 2016-01-28 | Discharge: 2016-01-28 | Disposition: A | Discharge status: Home / Self Care | Payer: BLUE CROSS/BLUE SHIELD | Source: Ambulatory Visit | Attending: Gastroenterology | Admitting: Gastroenterology

## 2016-01-28 ENCOUNTER — Encounter (HOSPITAL_COMMUNITY): Payer: Self-pay

## 2016-01-28 DIAGNOSIS — G43909 Migraine, unspecified, not intractable, without status migrainosus: Secondary | ICD-10-CM | POA: Diagnosis not present

## 2016-01-28 DIAGNOSIS — N183 Chronic kidney disease, stage 3 (moderate): Secondary | ICD-10-CM | POA: Diagnosis not present

## 2016-01-28 DIAGNOSIS — Z87891 Personal history of nicotine dependence: Secondary | ICD-10-CM | POA: Diagnosis not present

## 2016-01-28 DIAGNOSIS — Z8673 Personal history of transient ischemic attack (TIA), and cerebral infarction without residual deficits: Secondary | ICD-10-CM | POA: Diagnosis not present

## 2016-01-28 DIAGNOSIS — K839 Disease of biliary tract, unspecified: Secondary | ICD-10-CM

## 2016-01-28 DIAGNOSIS — Z4659 Encounter for fitting and adjustment of other gastrointestinal appliance and device: Secondary | ICD-10-CM | POA: Insufficient documentation

## 2016-01-28 DIAGNOSIS — Z79899 Other long term (current) drug therapy: Secondary | ICD-10-CM | POA: Diagnosis not present

## 2016-01-28 DIAGNOSIS — I129 Hypertensive chronic kidney disease with stage 1 through stage 4 chronic kidney disease, or unspecified chronic kidney disease: Secondary | ICD-10-CM | POA: Diagnosis not present

## 2016-01-28 DIAGNOSIS — K219 Gastro-esophageal reflux disease without esophagitis: Secondary | ICD-10-CM | POA: Insufficient documentation

## 2016-01-28 HISTORY — PX: ESOPHAGOGASTRODUODENOSCOPY: SHX5428

## 2016-01-28 SURGERY — EGD (ESOPHAGOGASTRODUODENOSCOPY)
Anesthesia: Monitor Anesthesia Care

## 2016-01-28 MED ORDER — PROPOFOL 500 MG/50ML IV EMUL
INTRAVENOUS | Status: DC | PRN
  Administered 2016-01-28: 100 ug/kg/min via INTRAVENOUS

## 2016-01-28 MED ORDER — PROPOFOL 10 MG/ML IV BOLUS
INTRAVENOUS | Status: AC
  Filled 2016-01-28: qty 20

## 2016-01-28 MED ORDER — SUCCINYLCHOLINE CHLORIDE 200 MG/10ML IV SOSY
PREFILLED_SYRINGE | INTRAVENOUS | Status: AC
  Filled 2016-01-28: qty 10

## 2016-01-28 MED ORDER — LIDOCAINE 2% (20 MG/ML) 5 ML SYRINGE
INTRAMUSCULAR | Status: AC
  Filled 2016-01-28: qty 5

## 2016-01-28 MED ORDER — ROCURONIUM BROMIDE 50 MG/5ML IV SOSY
PREFILLED_SYRINGE | INTRAVENOUS | Status: AC
  Filled 2016-01-28: qty 5

## 2016-01-28 MED ORDER — MIDAZOLAM HCL 5 MG/5ML IJ SOLN
INTRAMUSCULAR | Status: DC | PRN
  Administered 2016-01-28: 2 mg via INTRAVENOUS

## 2016-01-28 MED ORDER — PROPOFOL 500 MG/50ML IV EMUL
INTRAVENOUS | Status: DC | PRN
  Administered 2016-01-28: 40 mg via INTRAVENOUS

## 2016-01-28 MED ORDER — MIDAZOLAM HCL 2 MG/2ML IJ SOLN
INTRAMUSCULAR | Status: AC
  Filled 2016-01-28: qty 2

## 2016-01-28 MED ORDER — SODIUM CHLORIDE 0.9 % IV SOLN: INTRAVENOUS | Status: DC

## 2016-01-28 MED ORDER — LACTATED RINGERS IV SOLN
INTRAVENOUS | Status: DC
  Administered 2016-01-28: 1000 mL via INTRAVENOUS
  Administered 2016-01-28: 10:00:00 via INTRAVENOUS

## 2016-01-28 NOTE — Op Note (Signed)
[facility] Patient Name: [name] Procedure Date: 01/28/2016 MRN: [id number] Attending MD: Estill Cotta. Loletha Carrow , MD Date of Birth: [birth year] CSN: [id number] Age: 65 Admit Type: Outpatient Procedure:                Upper GI endoscopy Indications:              Biliary stent removal Providers:                Mallie Mussel L. Loletha Carrow, MD, Hilma Favors, RN, Alfonso Patten,                            Technician, Arnoldo Hooker, CRNA Referring MD:              Medicines:                Monitored Anesthesia Care Complications:            No immediate complications. Estimated Blood Loss:     Estimated blood loss: none. Procedure:                Pre-Anesthesia Assessment:                           - Prior to the procedure, a History and Physical                            was performed, and patient medications and                            allergies were reviewed. The patient's tolerance of                            previous anesthesia was also reviewed. The risks                            and benefits of the procedure and the sedation                            options and risks were discussed with the patient.                            All questions were answered, and informed consent                            was obtained. Prior Anticoagulants: The patient has                            taken no previous anticoagulant or antiplatelet                            agents. ASA Grade Assessment: II - A patient with                            mild systemic disease. After reviewing the risks  and benefits, the patient was deemed in                            satisfactory condition to undergo the procedure.                           After obtaining informed consent, the endoscope was                            passed under direct vision. Throughout the                            procedure, the patient's blood pressure, pulse, and                            oxygen  saturations were monitored continuously. The                            was introduced through the mouth, and advanced to                            the second part of duodenum. The [id number]                            [id number]) scope was introduced through the and                            advanced to the. The upper GI endoscopy was                            accomplished without difficulty. The patient                            tolerated the procedure well. Scope In: Scope Out: Findings:      The esophagus was normal.      The stomach was normal.      A previously placed plastic stent was seen in the area of the papilla.       Stent removal was accomplished with a rat-toothed forceps and snare.       Photodocumentation of an intact stent was obtained. Impression:               - Normal esophagus.                           - Normal stomach.                           - Plastic stent in the duodenum. Removed. Moderate Sedation:      MAC sedation used Recommendation:           - Patient has a contact number available for                            emergencies. The signs and symptoms of potential  delayed complications were discussed with the                            patient. Return to normal activities tomorrow.                            Written discharge instructions were provided to the                            patient.                           - Resume previous diet.                           - Continue present medications. Procedure Code(s):        --- Professional ---                           [phone], Esophagogastroduodenoscopy, flexible,                            transoral; with removal of foreign body(s) Diagnosis Code(s):        --- Professional ---                           Z46.59, Encounter for fitting and adjustment of                            other gastrointestinal appliance and device CPT copyright 2016 [employer] Association. All  rights reserved. The codes documented in this report are preliminary and upon coder review may  be revised to meet current compliance requirements. Henry L. Loletha Carrow, MD 01/28/2016 10:40:10 AM This report has been signed electronically. Number of Addenda: 0

## 2016-01-28 NOTE — H&P (Signed)
History:  This patient presents for endoscopic testing for removal of biliary stent.  [name] Referring physician: Georges Lynch, MD  Past Medical History: Past Medical History:  Diagnosis Date  . Chronic kidney disease    stage 3  . GERD (gastroesophageal reflux disease)   . Hyperlipidemia   . Migraine headache   . Occipital neuralgia    Left  . Renal insufficiency   . Stroke [facility]) 10/2013     Past Surgical History: Past Surgical History:  Procedure Laterality Date  . ABDOMINAL HYSTERECTOMY    . BILIARY STENT PLACEMENT N/A 12/12/2015   Procedure: BILIARY STENT PLACEMENT;  Surgeon: Doran Stabler, MD;  Location: [facility] ENDOSCOPY;  Service: Endoscopy;  Laterality: N/A;  . BLADDER REPAIR    . CARPAL TUNNEL RELEASE    . CERVICAL SPINE SURGERY    . CHOLECYSTECTOMY N/A 12/06/2015   Procedure: LAPAROSCOPIC CHOLECYSTECTOMY WITH  INTRAOPERATIVE CHOLANGIOGRAM;  Surgeon: Stark Klein, MD;  Location: [facility];  Service: General;  Laterality: N/A;  . ELBOW SURGERY    . ERCP N/A 12/12/2015   Procedure: ENDOSCOPIC RETROGRADE CHOLANGIOPANCREATOGRAPHY (ERCP);  Surgeon: Doran Stabler, MD;  Location[facility] ENDOSCOPY;  Service: Endoscopy;  Laterality: N/A;  . IR GENERIC HISTORICAL  12/17/2015   IR US GUIDE BX ASP/DRAIN 12/17/2015 [facility]INTER[facility]  . IR GENERIC HISTORICAL  12/17/2015   IR GUIDED DRAIN W CATHETER PLACEMENT 12/17/2015 [facility]  . IR GENERIC HISTORICAL  12/17/2015   IR SINUS/FIST TUBE CHK-NON GI 12/17/2015 [facility]  . KNEE SURGERY    . LUNG SURGERY    . TEE WITHOUT CARDIOVERSION N/A 12/19/2015   Procedure: TRANSESOPHAGEAL ECHOCARDIOGRAM (TEE);  Surgeon: Josue Hector, MD;  Location: [facility] ENDOSCOPY;  Service: Cardiovascular;  Laterality: N/A;    Allergies: Allergies  Allergen Reactions  . Shellfish Allergy Anaphylaxis and Other (See Comments)    All seafood    Outpatient Meds: Current Facility-Administered Medications  Medication Dose Route Frequency Provider Last  Rate Last Dose  . 0.9 %  sodium chloride infusion   Intravenous Continuous [name], MD      . lactated ringers infusion   Intravenous Continuous [name], MD 125 mL/hr at 01/28/16 [id number]        ___________________________________________________________________ Objective   Exam:  BP (!) 173/91   Pulse (!) 101   Temp 98.2 F (36.8 C)   Resp (!) 22   Ht 5\' 7"  (1.702 m)   Wt 159 lb (72.1 kg)   SpO2 99%   BMI 24.90 kg/m    CV: RRR without murmur, S1/S2, no JVD, no peripheral edema  Resp: clear to auscultation bilaterally, normal RR and effort noted  GI: soft, no tenderness, with active bowel sounds. No guarding or palpable organomegaly noted.  Neuro: awake, alert and oriented x 3. Normal gross motor function and fluent speech   Assessment:  Prior bile leak after cholecystectomy.  Removal of biliary stent  Plan:  EGD with stent removal by snare   [name]

## 2016-01-28 NOTE — Discharge Instructions (Signed)
YOU HAD AN ENDOSCOPIC PROCEDURE TODAY: Refer to the procedure report and other information in the discharge instructions given to you for any specific questions about what was found during the examination. If this information does not answer your questions, please call [place] office at [phone] to clarify.  ° °YOU SHOULD EXPECT: Some feelings of bloating in the abdomen. Passage of more gas than usual. Walking can help get rid of the air that was put into your GI tract during the procedure and reduce the bloating. If you had a lower endoscopy (such as a colonoscopy or flexible sigmoidoscopy) you may notice spotting of blood in your stool or on the toilet paper. Some abdominal soreness may be present for a day or two, also. ° °DIET: Your first meal following the procedure should be a light meal and then it is ok to progress to your normal diet. A half-sandwich or bowl of soup is an example of a good first meal. Heavy or fried foods are harder to digest and may make you feel nauseous or bloated. Drink plenty of fluids but you should avoid alcoholic beverages for 24 hours. If you had a esophageal dilation, please see attached instructions for diet.   ° °ACTIVITY: Your care partner should take you home directly after the procedure. You should plan to take it easy, moving slowly for the rest of the day. You can resume normal activity the day after the procedure however YOU SHOULD NOT DRIVE, use power tools, machinery or perform tasks that involve climbing or major physical exertion for 24 hours (because of the sedation medicines used during the test).  ° °SYMPTOMS TO REPORT IMMEDIATELY: °A gastroenterologist can be reached at any hour. Please call [phone]  for any of the following symptoms:  °Following lower endoscopy (colonoscopy, flexible sigmoidoscopy) °Excessive amounts of blood in the stool  °Significant tenderness, worsening of abdominal pains  °Swelling of the abdomen that is new, acute  °Fever of 100° or  higher  °Following upper endoscopy (EGD, EUS, ERCP, esophageal dilation) °Vomiting of blood or coffee ground material  °New, significant abdominal pain  °New, significant chest pain or pain under the shoulder blades  °Painful or persistently difficult swallowing  °New shortness of breath  °Black, tarry-looking or red, bloody stools ° °FOLLOW UP:  °If any biopsies were taken you will be contacted by phone or by letter within the next 1-3 weeks. Call [phone]  if you have not heard about the biopsies in 3 weeks.  °Please also call with any specific questions about appointments or follow up tests. ° °

## 2016-01-28 NOTE — Interval H&P Note (Signed)
History and Physical Interval Note:  01/28/2016 9:54 AM  [name]  has presented today for surgery, with the diagnosis of Bile leak, biliary stent placed 12/12/15, Needs removal of biliary stent   The various methods of treatment have been discussed with the patient and family. After consideration of risks, benefits and other options for treatment, the patient has consented to  EGD with foreign body (stent) removal as a surgical intervention .  The patient's history has been reviewed, patient examined, no change in status, stable for surgery.  I have reviewed the patient's chart and labs.  Questions were answered to the patient's satisfaction.     [name]

## 2016-01-28 NOTE — Anesthesia Preprocedure Evaluation (Addendum)
Anesthesia Evaluation  Patient identified by MRN, date of birth, ID band Patient awake    Reviewed: Allergy & Precautions, NPO status , Patient's Chart, lab work & pertinent test results  Airway Mallampati: II  TM Distance: >3 FB Neck ROM: Full    Dental  (+) Teeth Intact   Pulmonary former smoker,    breath sounds clear to auscultation       Cardiovascular hypertension,  Rhythm:Regular Rate:Normal     Neuro/Psych TIA Neuromuscular disease CVA    GI/Hepatic GERD  ,(+)     substance abuse  alcohol use,   Endo/Other    Renal/GU Renal disease     Musculoskeletal   Abdominal   Peds  Hematology negative hematology ROS (+)   Anesthesia Other Findings   Reproductive/Obstetrics                             Anesthesia Physical Anesthesia Plan  ASA: III  Anesthesia Plan: MAC   Post-op Pain Management:    Induction: Intravenous  Airway Management Planned: Natural Airway and Simple Face Mask  Additional Equipment:   Intra-op Plan:   Post-operative Plan:   Informed Consent: I have reviewed the patients History and Physical, chart, labs and discussed the procedure including the risks, benefits and alternatives for the proposed anesthesia with the patient or authorized representative who has indicated his/her understanding and acceptance.     Plan Discussed with: CRNA  Anesthesia Plan Comments:         Anesthesia Quick Evaluation

## 2016-01-28 NOTE — Transfer of Care (Signed)
Immediate Anesthesia Transfer of Care Note  Patient: [name]  Procedure(s) Performed:   Patient Location: PACU  Anesthesia Type:MAC  Level of Consciousness: awake and alert   Airway & Oxygen Therapy: Patient Spontanous Breathing  Post-op Assessment: Report given to RN  Post vital signs: Reviewed and stable  Last Vitals:  Vitals:   01/28/16 0923  BP: (!) 173/91  Pulse: (!) 101  Resp: (!) 22  Temp: 36.8 C    Last Pain: There were no vitals filed for this visit.       Complications: No apparent anesthesia complications

## 2016-01-29 ENCOUNTER — Encounter (HOSPITAL_COMMUNITY): Payer: Self-pay | Admitting: Gastroenterology

## 2016-01-29 NOTE — Telephone Encounter (Signed)
Sandi would you mind calling this patient?

## 2016-01-29 NOTE — Telephone Encounter (Signed)
appt scheduled for 2/21

## 2016-02-03 ENCOUNTER — Other Ambulatory Visit: Payer: Self-pay | Admitting: Adult Health

## 2016-02-04 NOTE — Telephone Encounter (Signed)
Noted, thank you

## 2016-02-06 ENCOUNTER — Telehealth: Payer: Self-pay | Admitting: Neurology

## 2016-02-06 NOTE — Telephone Encounter (Signed)
2 weeks after her last Botox treatment, the patient has developed some ptosis of the left eye. She has come in today for a quick evaluation. There is no pupillary asymmetry, there is definite ptosis on the left, none on the right.  Unfortunately, this will need to resolve on its own, I have cautioned the patient about driving issue may not have good depth perception if the vision in the left eye is blocked.  The patient is expected make a full recovery in several weeks.

## 2016-02-08 ENCOUNTER — Ambulatory Visit: Payer: Self-pay | Admitting: Gastroenterology

## 2016-02-12 ENCOUNTER — Other Ambulatory Visit: Payer: Self-pay | Admitting: Neurology

## 2016-02-12 ENCOUNTER — Other Ambulatory Visit: Payer: Self-pay | Admitting: Family Medicine

## 2016-02-12 NOTE — Anesthesia Postprocedure Evaluation (Signed)
Anesthesia Post Note  Patient: [name]  Procedure(s) Performed: Procedure(s) (LRB): ESOPHAGOGASTRODUODENOSCOPY (EGD) Biliary STENT removal (N/A)  Patient location during evaluation: Endoscopy Anesthesia Type: MAC Level of consciousness: awake and alert Pain management: pain level controlled Vital Signs Assessment: post-procedure vital signs reviewed and stable Respiratory status: spontaneous breathing, nonlabored ventilation, respiratory function stable and patient connected to nasal cannula oxygen Cardiovascular status: stable and blood pressure returned to baseline Anesthetic complications: no    Last Vitals:  Vitals:   01/28/16 0923 01/28/16 1040  BP: (!) 173/91 (!) 144/77  Pulse: (!) 101 86  Resp: (!) 22 19  Temp: 36.8 C 36.9 C    Last Pain:  Vitals:   01/28/16 1040  TempSrc: Oral                 Lynsie Mcwatters,JAMES TERRILL

## 2016-02-22 ENCOUNTER — Ambulatory Visit: Payer: Self-pay | Admitting: Family Medicine

## 2016-02-25 ENCOUNTER — Ambulatory Visit (INDEPENDENT_AMBULATORY_CARE_PROVIDER_SITE_OTHER): Payer: BLUE CROSS/BLUE SHIELD | Admitting: Family Medicine

## 2016-02-25 VITALS — BP 145/90 | HR 91 | Temp 97.9°F | Wt 162.6 lb

## 2016-02-25 DIAGNOSIS — Z79899 Other long term (current) drug therapy: Secondary | ICD-10-CM | POA: Diagnosis not present

## 2016-02-25 DIAGNOSIS — Z09 Encounter for follow-up examination after completed treatment for conditions other than malignant neoplasm: Secondary | ICD-10-CM

## 2016-02-25 DIAGNOSIS — I10 Essential (primary) hypertension: Secondary | ICD-10-CM

## 2016-02-25 LAB — BASIC METABOLIC PANEL WITH GFR
BUN: 16 mg/dL (ref 7–25)
CO2: 23 mmol/L (ref 20–31)
Calcium: 9 mg/dL (ref 8.6–10.4)
Chloride: 108 mmol/L (ref 98–110)
Creat: 1.1 mg/dL — ABNORMAL HIGH (ref 0.50–0.99)
GFR, Est African American: 61 mL/min (ref >=60)
GFR, Est Non African American: 53 mL/min — ABNORMAL LOW (ref >=60)
Glucose, Bld: 99 mg/dL (ref 65–99)
Potassium: 4.3 mmol/L (ref 3.5–5.3)
Sodium: 141 mmol/L (ref 135–146)

## 2016-02-25 NOTE — Assessment & Plan Note (Signed)
Patient is here for hospital follow-up. Overall she is doing quite well. No significant issues to address at this time.

## 2016-02-25 NOTE — Patient Instructions (Addendum)
It was a pleasure seeing you today in our clinic. Today we discussed your recent hospital stay. Here is the treatment plan we have discussed and agreed upon together:   - Today I would like to get a blood test on you to check for your kidney function and electrolytes. - I'm glad that you have been doing so well since your recent discharge. If you have any questions do not hesitate to contact our office.

## 2016-02-25 NOTE — Assessment & Plan Note (Signed)
Patient showed worsening hypertension on presentation. She endorses that she had not taken her blood pressure medication this morning because she was running late for her appointment. - I encouraged her to stay compliant with her medications. No changes will be made at this time.  Of note: Patient endorsed that she was still taking Lasix. At this time I am not quite sure why she is on this medication. After discussion she stated that she was placed on this by her nephrologist for her "kidney disease". Her most recent creatinine levels seem to be well within normal range, and she has a normal EF on echocardiogram. My concern is that this Lasix is putting patient at risk for electrolyte abnormalities without much benefit. I've asked patient to discuss this with her nephrologist at their next appointment (~3wks from now).

## 2016-02-25 NOTE — Progress Notes (Signed)
   HPI  CC: Hospital follow-up Patient is here for hospital follow-up. She states that she has been doing well since being discharged from the hospital. She endorses good compliance with her medication regimen at this time. She spent much of our time discussing the events of her hospital stays. She is in overall good spirits today. She has no significant concerns that she would like to be addressed at this time.  ROS: Patient denies any fever, chills, vision changes, headache, recent nausea/vomiting, diarrhea, chest pain, shortness of breath, dysuria.  CC, SH/smoking status, and VS noted  Objective: BP (!) 145/90 (BP Location: Left Arm, Patient Position: Sitting, Cuff Size: Normal)   Pulse 91   Temp 97.9 F (36.6 C) (Oral)   Wt 162 lb 9.6 oz (73.8 kg)   SpO2 99%   BMI 25.47 kg/m  Gen: NAD, alert, cooperative, and pleasant. CV: RRR, no murmur Resp: CTAB, no wheezes, non-labored Abd: SNTND, BS present, no guarding or organomegaly Ext: No edema, warm  Assessment and plan:  Hospital discharge follow-up Patient is here for hospital follow-up. Overall she is doing quite well. No significant issues to address at this time.  HTN (hypertension) Patient showed worsening hypertension on presentation. She endorses that she had not taken her blood pressure medication this morning because she was running late for her appointment. - I encouraged her to stay compliant with her medications. No changes will be made at this time.  Of note: Patient endorsed that she was still taking Lasix. At this time I am not quite sure why she is on this medication. After discussion she stated that she was placed on this by her nephrologist for her "kidney disease". Her most recent creatinine levels seem to be well within normal range, and she has a normal EF on echocardiogram. My concern is that this Lasix is putting patient at risk for electrolyte abnormalities without much benefit. I've asked patient to discuss  this with her nephrologist at their next appointment (~3wks from now).   Orders Placed This Encounter  Procedures  . BASIC METABOLIC PANEL WITH GFR    Elberta Leatherwood, MD,MS,  PGY3 02/25/2016 6:35 PM

## 2016-03-05 ENCOUNTER — Ambulatory Visit: Payer: BLUE CROSS/BLUE SHIELD | Admitting: Neurology

## 2016-03-12 DIAGNOSIS — M170 Bilateral primary osteoarthritis of knee: Secondary | ICD-10-CM | POA: Diagnosis not present

## 2016-03-12 DIAGNOSIS — M1712 Unilateral primary osteoarthritis, left knee: Secondary | ICD-10-CM | POA: Diagnosis not present

## 2016-04-08 ENCOUNTER — Encounter: Payer: Self-pay | Admitting: Neurology

## 2016-04-08 ENCOUNTER — Ambulatory Visit (INDEPENDENT_AMBULATORY_CARE_PROVIDER_SITE_OTHER): Payer: Medicare HMO | Admitting: Neurology

## 2016-04-08 VITALS — BP 117/75 | HR 82 | Ht 67.0 in | Wt 167.5 lb

## 2016-04-08 DIAGNOSIS — G43711 Chronic migraine without aura, intractable, with status migrainosus: Secondary | ICD-10-CM

## 2016-04-08 MED ORDER — ZONISAMIDE 25 MG PO CAPS: ORAL_CAPSULE | ORAL | 1 refills | Status: DC

## 2016-04-08 NOTE — Patient Instructions (Addendum)
With the topamax, go down off the medication by 1/2 tablet (50 mg) every week until off of the medication. We will start Zonegran for the headache.  After you are on Zonegran 75 mg (three capsules) twice a day call our office to get a prescription for the 100 mg capsules.

## 2016-04-08 NOTE — Progress Notes (Signed)
Reason for visit: Migraine headache  [name] is an 66 y.o. female  History of present illness:  [name] is a 66 year old right-handed white female with a history of intractable migraine headache. The patient has been on Topamax taking 100 mg twice daily without benefit. After the last Botox injection, the patient developed ptosis on the left eye that is just now resolving. The Botox injection did not help her at all with the second treatment, the first treatment did elicit a good benefit for 3 or 4 weeks. The patient is not sleeping well, she gets about 3 hours sleep with 200 mg of doxepin at night, then she will wake up and have difficulty getting back to sleep. The patient has headaches that are always present around the right frontotemporal area with a dull achy pain, 2 or 3 times a month she may get severe headaches that are disabling for her. The patient comes back to the office today for an evaluation. The patient does report some neck stiffness with the headache, she remains on tizanidine.  Past Medical History:  Diagnosis Date  . Chronic kidney disease    stage 3  . GERD (gastroesophageal reflux disease)   . Hyperlipidemia   . Migraine headache   . Occipital neuralgia    Left  . Renal insufficiency   . Stroke [facility]) 10/2013      Family History  Problem Relation Age of Onset  . Cancer Mother 51    breast  . Alzheimer's disease Mother   . Cancer Brother   . Hyperlipidemia Brother   . Prostate cancer Father   . Lung cancer Father     Social history:  reports that she quit smoking about 4 years ago. Her smoking use included Cigarettes. She smoked 0.30 packs per day. She has never used smokeless tobacco. She reports that she does not drink alcohol or use drugs.    Allergies  Allergen Reactions  . Shellfish Allergy Anaphylaxis and Other (See Comments)    All seafood    Medications:  Prior to Admission medications   Medication Sig Start Date End Date  Taking? Authorizing Provider  clopidogrel (PLAVIX) 75 MG tablet TAKE 1 TABLET BY MOUTH DAILY 02/12/16  Yes Elberta Leatherwood, MD  doxepin (SINEQUAN) 100 MG capsule Take 2 capsules (200 mg total) by mouth at bedtime. 10/15/14  Yes Kathrynn Ducking, MD  esomeprazole (NEXIUM) 20 MG capsule Take 20 mg by mouth daily.   Yes Historical Provider, MD  furosemide (LASIX) 20 MG tablet Take 20 mg by mouth daily.  05/09/14  Yes Historical Provider, MD  gabapentin (NEURONTIN) 600 MG tablet Take 1 tablet (600 mg total) by mouth 2 (two) times daily. 12/05/15  Yes Charlett Blake, MD  lidocaine (LIDODERM) 5 % Place 2 patches onto the skin daily. Remove & Discard patch within 12 hours or as directed by MD 11/28/15  Yes Charlett Blake, MD  meclizine (ANTIVERT) 25 MG tablet Take 1 tablet (25 mg total) by mouth 2 (two) times daily as needed for dizziness. 07/16/15  Yes Elberta Leatherwood, MD  ramipril (ALTACE) 2.5 MG capsule Do not take this till you go back to the Clinic and they ask you to restart it. Patient taking differently: Take 2.5 mg by mouth daily. Do not take this till you go back to the Clinic and they ask you to restart it. 01/07/16  Yes Earnstine Regal, PA-C  sertraline (ZOLOFT) 50 MG tablet TAKE 1 TABLET (  50 MG TOTAL) BY MOUTH 2 (TWO) TIMES DAILY. 12/13/15  Yes Elberta Leatherwood, MD  tiZANidine (ZANAFLEX) 2 MG tablet TAKE 1 TABLET 3 TIMES A DAY 02/05/16  Yes Kathrynn Ducking, MD  zonisamide (ZONEGRAN) 25 MG capsule One capsule twice a day for one week, then take 2 capsules twice a day for 1 week, then take 3 capsules twice a day 04/08/16   Kathrynn Ducking, MD    ROS:  Out of a complete 14 system review of symptoms, the patient complains only of the following symptoms, and all other reviewed systems are negative.  Ringing in the ears Shortness of breath Memory loss, dizziness, headache, numbness, speech difficulty Restless legs Muscle cramps  Blood pressure 117/75, pulse 82, height 5\' 7"  (1.702 m), weight 167 lb 8  oz (76 kg).  Physical Exam  General: The patient is alert and cooperative at the time of the examination.  Skin: No significant peripheral edema is noted.   Neurologic Exam  Mental status: The patient is alert and oriented x 3 at the time of the examination. The patient has apparent normal recent and remote memory, with an apparently normal attention span and concentration ability.   Cranial nerves: Facial symmetry is present. Speech is normal, no aphasia or dysarthria is noted. Extraocular movements are full. Visual fields are full. Mild left-sided ptosis is noted. Soft touch sensation on the face is symmetric.  Motor: The patient has good strength in all 4 extremities.  Sensory examination: Soft touch sensation is symmetric on the face, but is decreased on the right arm and right leg as compared to the left.  Coordination: The patient has good finger-nose-finger and heel-to-shin bilaterally.  Gait and station: The patient has a normal gait. Tandem gait is normal. Romberg is negative. No drift is seen.  Reflexes: Deep tendon reflexes are symmetric.   Assessment/Plan:  1. Intractable migraine headache  The patient will be tapered off of Topamax by 50 mg every week until she is off the drug. We will start Zonegran and workup to 100 mg twice daily. The patient will call our office when she gets to 75 mg twice daily and I will call in the 100 mg capsules. She will follow-up in 4 months. She will come back in 3 weeks for her third Botox injection, if this is not effective, we will not continue the Botox treatments.  Jill Alexanders MD 04/08/2016 8:24 AM  [facility] [address] [place], [place] [address]  Phone [phone] Fax [phone]

## 2016-04-28 ENCOUNTER — Telehealth: Payer: Self-pay | Admitting: Neurology

## 2016-04-28 NOTE — Telephone Encounter (Signed)
Spoke with the patient who stated that injections did not work for her and she would like to cancel her apt.

## 2016-04-30 ENCOUNTER — Ambulatory Visit: Payer: BLUE CROSS/BLUE SHIELD | Admitting: Neurology

## 2016-05-14 DIAGNOSIS — M170 Bilateral primary osteoarthritis of knee: Secondary | ICD-10-CM | POA: Diagnosis not present

## 2016-05-16 ENCOUNTER — Ambulatory Visit: Payer: Self-pay | Admitting: Physical Medicine & Rehabilitation

## 2016-05-21 DIAGNOSIS — M170 Bilateral primary osteoarthritis of knee: Secondary | ICD-10-CM | POA: Diagnosis not present

## 2016-05-28 DIAGNOSIS — M170 Bilateral primary osteoarthritis of knee: Secondary | ICD-10-CM | POA: Diagnosis not present

## 2016-05-30 ENCOUNTER — Ambulatory Visit (HOSPITAL_BASED_OUTPATIENT_CLINIC_OR_DEPARTMENT_OTHER): Payer: Medicare HMO | Admitting: Physical Medicine & Rehabilitation

## 2016-05-30 ENCOUNTER — Encounter: Payer: Self-pay | Admitting: Physical Medicine & Rehabilitation

## 2016-05-30 ENCOUNTER — Encounter: Payer: Medicare HMO | Attending: Physical Medicine & Rehabilitation

## 2016-05-30 VITALS — BP 155/92 | HR 84

## 2016-05-30 DIAGNOSIS — N183 Chronic kidney disease, stage 3 (moderate): Secondary | ICD-10-CM | POA: Diagnosis not present

## 2016-05-30 DIAGNOSIS — Z79899 Other long term (current) drug therapy: Secondary | ICD-10-CM | POA: Diagnosis not present

## 2016-05-30 DIAGNOSIS — Z8042 Family history of malignant neoplasm of prostate: Secondary | ICD-10-CM | POA: Insufficient documentation

## 2016-05-30 DIAGNOSIS — M5481 Occipital neuralgia: Secondary | ICD-10-CM | POA: Diagnosis not present

## 2016-05-30 DIAGNOSIS — K219 Gastro-esophageal reflux disease without esophagitis: Secondary | ICD-10-CM | POA: Diagnosis not present

## 2016-05-30 DIAGNOSIS — Z801 Family history of malignant neoplasm of trachea, bronchus and lung: Secondary | ICD-10-CM | POA: Insufficient documentation

## 2016-05-30 DIAGNOSIS — Z87891 Personal history of nicotine dependence: Secondary | ICD-10-CM | POA: Insufficient documentation

## 2016-05-30 DIAGNOSIS — Z820 Family history of epilepsy and other diseases of the nervous system: Secondary | ICD-10-CM | POA: Diagnosis not present

## 2016-05-30 DIAGNOSIS — Z809 Family history of malignant neoplasm, unspecified: Secondary | ICD-10-CM | POA: Diagnosis not present

## 2016-05-30 DIAGNOSIS — E785 Hyperlipidemia, unspecified: Secondary | ICD-10-CM | POA: Diagnosis not present

## 2016-05-30 DIAGNOSIS — G588 Other specified mononeuropathies: Secondary | ICD-10-CM

## 2016-05-30 DIAGNOSIS — G43909 Migraine, unspecified, not intractable, without status migrainosus: Secondary | ICD-10-CM | POA: Insufficient documentation

## 2016-05-30 DIAGNOSIS — Z8673 Personal history of transient ischemic attack (TIA), and cerebral infarction without residual deficits: Secondary | ICD-10-CM | POA: Diagnosis not present

## 2016-05-30 DIAGNOSIS — Z803 Family history of malignant neoplasm of breast: Secondary | ICD-10-CM | POA: Insufficient documentation

## 2016-05-30 MED ORDER — GABAPENTIN 600 MG PO TABS: 600.0000 mg | ORAL_TABLET | Freq: Two times a day (BID) | ORAL | 1 refills | Status: DC

## 2016-05-30 MED ORDER — LIDOCAINE 5 % EX PTCH: 2.0000 | MEDICATED_PATCH | CUTANEOUS | 5 refills | Status: DC

## 2016-05-30 NOTE — Progress Notes (Signed)
Subjective:    Patient ID: [name], female    DOB: 02/18/1951, 66 y.o.   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 She currently uses Lidoderm patch 2 patches on 7 AM, off 7 PM. In addition, she uses gabapentin 600 mg 3 times a day. She has had no adverse reactions to the medications and they have been effective for her intercostal nerve pain  Interval hx cholecystitis with rupture followed by cholecystectomy, bile leak, sepsis.  2 month hospitalization.   Pain Inventory Average Pain 8 Pain Right Now 8 My pain is sharp, burning and aching  In the last 24 hours, has pain interfered with the following? General activity 8 Relation with others 8 Enjoyment of life 8 What TIME of day is your pain at its worst? evening Sleep (in general) Poor  Pain is worse with: bending, standing and some activites Pain improves with: rest, heat/ice and medication Relief from Meds: 7  Mobility walk without assistance ability to climb steps?  yes do you drive?  yes  Function disabled: date disabled .  Neuro/Psych bladder control problems bowel control problems numbness tingling depression anxiety  Prior Studies Any changes since last visit?  no  Physicians involved in your care Any changes since last visit?  no   Family History  Problem Relation Age of Onset  . Cancer Mother 67    breast  . Alzheimer's disease Mother   . Cancer Brother   . Hyperlipidemia Brother   . Prostate cancer Father   . Lung cancer Father    Social History   Social History  . Marital status: Married    Spouse name: [name]  . Number of children: 1  . Years of education: 12+   Occupational History  . [employer]  . CNA    Social History Main Topics  . Smoking status: Former Smoker    Packs/day: 0.30    Types: Cigarettes      Quit date: 11/01/2011  . Smokeless tobacco: Never Used  . Alcohol use No  . Drug use: No  . Sexual activity: Yes    Partners: Male    Birth control/ protection: Surgical     Comment: 1 sexual partner in last 12 months: married   Other Topics Concern  . Not on file   Social History Narrative   Patient is married ([name]).   Patient has one child.   Patient is working full-time.   Patient is right handed.   Patient has a college education.   Patient does not drink caffeine.    Past Medical History:  Diagnosis Date  . Chronic kidney disease    stage 3  . GERD (gastroesophageal reflux disease)   . Hyperlipidemia   . Migraine headache   . Occipital neuralgia    Left  . Renal insufficiency   . Stroke ([place]) 10/2013   There were no vitals taken for this visit.  Opioid Risk Score:   Fall Risk Score:  `1  Depression screen PHQ 2/9  Depression screen [facility] 2/9 01/25/2016 07/13/2015 06/18/2015 05/21/2015 05/18/2015 04/02/2015 02/19/2015  Decreased Interest 0 0 0 0 1 0 1  Down, Depressed, Hopeless 1 0 1 0 1 0 1  PHQ - 2 Score 1 0 1 0 2 0 2  Altered sleeping - - - - 3 - -  Tired, decreased  energy - - - - 3 - -  Change in appetite - - - - 0 - -  Feeling bad or failure about yourself  - - - - 3 - -  Trouble concentrating - - - - 3 - -  Moving slowly or fidgety/restless - - - - 3 - -  Suicidal thoughts - - - - 0 - -  PHQ-9 Score - - - - 17 - -  Difficult doing work/chores - - - - Somewhat difficult - -    Review of Systems  Constitutional: Positive for appetite change.  HENT: Negative.   Eyes: Negative.   Respiratory: Positive for shortness of breath.   Cardiovascular: Negative.   Gastrointestinal: Negative.   Endocrine: Negative.   Genitourinary: Negative.   Musculoskeletal: Negative.   Allergic/Immunologic: Negative.   Neurological: Negative.   Hematological: Negative.   Psychiatric/Behavioral: Negative.   All other systems reviewed and are negative.      Objective:    Physical Exam  Constitutional: She is oriented to person, place, and time. She appears well-developed and well-nourished.  HENT:  Head: Normocephalic and atraumatic.  Eyes: Conjunctivae and EOM are normal. Pupils are equal, round, and reactive to light.  Neurological: She is alert and oriented to person, place, and time.  Psychiatric: She has a normal mood and affect. Her behavior is normal.  Nursing note and vitals reviewed.   Tenderness to palpation, mid axillary line on the left side from T 8-12 No evidence of rib deformity. Skin overlying the area shows no      Assessment & Plan:  1. 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 Her pain is mainly at the mid axillary line. Do not think she needs any further interventional procedures. Continue Lidoderm 2 patches on every morning off every afternoon Continue gabapentin 600 mg 3 times a day 6 month supply of each medication, we discussed that I can certainly see her on a yearly basis to monitor these medications, but she sees her neurologist on a more frequent basis for her chronic migraines. Will ask if Dr. Jannifer Franklin is comfortable prescribing the gabapentin and Lidoderm.

## 2016-05-30 NOTE — Patient Instructions (Addendum)
Please check with Dr. Jannifer Franklin, or one of your other doctors to see if he can take over on the Lidoderm and gabapentin Prescriptions

## 2016-06-01 ENCOUNTER — Other Ambulatory Visit: Payer: Self-pay | Admitting: Family Medicine

## 2016-08-01 ENCOUNTER — Other Ambulatory Visit: Payer: Self-pay | Admitting: Neurology

## 2016-08-01 MED ORDER — ZONISAMIDE 100 MG PO CAPS: 100.0000 mg | ORAL_CAPSULE | Freq: Two times a day (BID) | ORAL | 0 refills | Status: DC

## 2016-08-01 NOTE — Telephone Encounter (Signed)
Called and spoke with patient. She is taking zonisamide 25mg  capsule 3 capsules 2x/day. She is doing well on medication, denies any side effects. Advised per CW,MD that if she was doing well, we will send in new rx for 100mg  capsules. She verbalized understanding and agreeable to this. She would [facility]ill sent to: [id number] [place] [place] [phone] (Phone) [phone] (Fax)   Spoke with AA,MD and got VO to send zonisamide 100mg  capsule BID.

## 2016-08-27 ENCOUNTER — Ambulatory Visit (INDEPENDENT_AMBULATORY_CARE_PROVIDER_SITE_OTHER): Payer: Medicare HMO | Admitting: Neurology

## 2016-08-27 ENCOUNTER — Other Ambulatory Visit: Payer: Self-pay | Admitting: Family Medicine

## 2016-08-27 ENCOUNTER — Encounter: Payer: Self-pay | Admitting: Neurology

## 2016-08-27 VITALS — BP 146/92 | HR 75 | Ht 67.0 in | Wt 170.0 lb

## 2016-08-27 DIAGNOSIS — G43711 Chronic migraine without aura, intractable, with status migrainosus: Secondary | ICD-10-CM

## 2016-08-27 NOTE — Progress Notes (Signed)
Reason for visit: Migraine headache  [name] is an 66 y.o. female  History of present illness:  [name] is a 66 year old right-handed white female with a history of intractable migraine headache. The patient got two injections of Botox, she decided not to continue the treatments because of lack of response. The patient is on doxepin, gabapentin, and Zonegran without complete control of the headaches. She has had one severe headache since last seen in January 2018, but she continues to have daily headache involving the right frontotemporal region mainly, she indicates that the headache never completely goes away. The patient is having difficulty sleeping because of the headache pain, she will sleep for 3 hours and then wake up, she may be able to get back to sleep for another 2 hours a night, but she has had chronic issues with insomnia even while on doxepin. The patient occasionally will have severe nausea and vomiting with the headache. She returns for an evaluation.  Past Medical History:  Diagnosis Date  . Chronic kidney disease    stage 3  . GERD (gastroesophageal reflux disease)   . Hyperlipidemia   . Migraine headache   . Occipital neuralgia    Left  . Renal insufficiency   . Stroke [facility]) 10/2013      Family History  Problem Relation Age of Onset  . Cancer Mother 45       breast  . Alzheimer's disease Mother   . Cancer Brother   . Hyperlipidemia Brother   . Prostate cancer Father   . Lung cancer Father     Social history:  reports that she quit smoking about 4 years ago. Her smoking use included Cigarettes. She smoked 0.30 packs per day. She has never used smokeless tobacco. She reports that she does not drink alcohol or use drugs.    Allergies  Allergen Reactions  . Shellfish Allergy Anaphylaxis and Other (See Comments)    All seafood  . Iodinated Diagnostic Agents   . Statins     Medications:  Prior to Admission medications   Medication Sig  Start Date End Date Taking? Authorizing Provider  atorvastatin (LIPITOR) 40 MG tablet TAKE 1 TABLET (40 MG TOTAL) BY MOUTH DAILY. 06/02/16  Yes McKeag, Marylynn Pearson, MD  clopidogrel (PLAVIX) 75 MG tablet TAKE 1 TABLET BY MOUTH DAILY 02/12/16  Yes McKeag, Marylynn Pearson, MD  doxepin (SINEQUAN) 100 MG capsule Take 2 capsules (200 mg total) by mouth at bedtime. 10/15/14  Yes Kathrynn Ducking, MD  esomeprazole (NEXIUM) 20 MG capsule Take 20 mg by mouth daily.   Yes [provider]  furosemide (LASIX) 20 MG tablet Take 20 mg by mouth daily.  05/09/14  Yes [provider]  gabapentin (NEURONTIN) 600 MG tablet Take 1 tablet (600 mg total) by mouth 2 (two) times daily. 05/30/16  Yes Kirsteins, Luanna Salk, MD  lidocaine (LIDODERM) 5 % Place 2 patches onto the skin daily. Remove & Discard patch within 12 hours or as directed by MD 05/30/16  Yes Kirsteins, Luanna Salk, MD  meclizine (ANTIVERT) 25 MG tablet Take 1 tablet (25 mg total) by mouth 2 (two) times daily as needed for dizziness. 07/16/15  Yes McKeag, Marylynn Pearson, MD  ramipril (ALTACE) 2.5 MG capsule Do not take this till you go back to the Clinic and they ask you to restart it. Patient taking differently: Take 2.5 mg by mouth daily. Do not take this till you go back to the Clinic and they  ask you to restart it. 01/07/16  Yes Earnstine Regal, PA-C  sertraline (ZOLOFT) 50 MG tablet TAKE 1 TABLET (50 MG TOTAL) BY MOUTH 2 (TWO) TIMES DAILY. 12/13/15  Yes McKeag, Marylynn Pearson, MD  tiZANidine (ZANAFLEX) 2 MG tablet TAKE 1 TABLET 3 TIMES A DAY 02/05/16  Yes Kathrynn Ducking, MD  zonisamide (ZONEGRAN) 100 MG capsule Take 1 capsule (100 mg total) by mouth 2 (two) times daily. 08/01/16  Yes Melvenia Beam, MD    ROS:  Out of a complete 14 system review of symptoms, the patient complains only of the following symptoms, and all other reviewed systems are negative.  Headache  Blood pressure (!) 146/92, pulse 75, height 5\' 7"  (1.702 m), weight 170 lb (77.1 kg).  Physical  Exam  General: The patient is alert and cooperative at the time of the examination.  Skin: No significant peripheral edema is noted.   Neurologic Exam  Mental status: The patient is alert and oriented x 3 at the time of the examination. The patient has apparent normal recent and remote memory, with an apparently normal attention span and concentration ability.   Cranial nerves: Facial symmetry is present. Speech is normal, no aphasia or dysarthria is noted. Extraocular movements are full. Visual fields are full.  Motor: The patient has good strength in all 4 extremities.  Sensory examination: Soft touch sensation is symmetric on the face, arms, and legs.  Coordination: The patient has good finger-nose-finger and heel-to-shin bilaterally.  Gait and station: The patient has a normal gait. Tandem gait is normal. Romberg is negative. No drift is seen.  Reflexes: Deep tendon reflexes are symmetric.   Assessment/Plan:  1. Intractable migraine headache  The patient needs to have ongoing daily headaches, we will go up on the Zonegran taking 125 mg twice study for 2 weeks and then take 150 milligrams twice daily. The patient may be a good candidate for Aimovig, we will try to give her a two-month trial on the medication. She will follow-up in 4 months.  Jill Alexanders MD 08/27/2016 7:43 AM  [facility] [address] [place], [place] [address]  Phone [phone] Fax [phone]

## 2016-08-27 NOTE — Patient Instructions (Signed)
   With the zonegran take an extra 25 mg twice a day for 2 weeks, then take 50 mg twice a day for a total of 150 mg twice a day.  We will try Aimovig for the headache.

## 2016-08-28 ENCOUNTER — Telehealth: Payer: Self-pay | Admitting: *Deleted

## 2016-08-28 NOTE — Telephone Encounter (Signed)
Faxed completed/signed aimovig service request form and prescription to [facility]. Fax: [phone]. Received confirmation. Also received confirmation page that fax was received.   

## 2016-09-01 ENCOUNTER — Other Ambulatory Visit: Payer: Self-pay | Admitting: Neurology

## 2016-09-01 MED ORDER — ZONISAMIDE 25 MG PO CAPS: ORAL_CAPSULE | ORAL | 11 refills | Status: DC

## 2016-09-08 NOTE — Telephone Encounter (Signed)
Pulled form - patient's name was already printed in the correct place.  Resent form and received confirmation.

## 2016-09-08 NOTE — Telephone Encounter (Signed)
Jocelyn Lamer with [employer] called office in reference to [employer] forms on page 1 in the signature area the patients name needs to be "printed" and refaxed.  Vicki's call back number [phone].

## 2016-09-12 DIAGNOSIS — M1711 Unilateral primary osteoarthritis, right knee: Secondary | ICD-10-CM | POA: Diagnosis not present

## 2016-09-12 DIAGNOSIS — M1712 Unilateral primary osteoarthritis, left knee: Secondary | ICD-10-CM | POA: Diagnosis not present

## 2016-09-12 DIAGNOSIS — M170 Bilateral primary osteoarthritis of knee: Secondary | ICD-10-CM | POA: Diagnosis not present

## 2016-09-17 DIAGNOSIS — R69 Illness, unspecified: Secondary | ICD-10-CM | POA: Diagnosis not present

## 2016-09-17 DIAGNOSIS — D62 Acute posthemorrhagic anemia: Secondary | ICD-10-CM | POA: Diagnosis not present

## 2016-09-17 DIAGNOSIS — R3129 Other microscopic hematuria: Secondary | ICD-10-CM | POA: Diagnosis not present

## 2016-09-17 DIAGNOSIS — N179 Acute kidney failure, unspecified: Secondary | ICD-10-CM | POA: Diagnosis not present

## 2016-09-17 DIAGNOSIS — N182 Chronic kidney disease, stage 2 (mild): Secondary | ICD-10-CM | POA: Diagnosis not present

## 2016-09-17 DIAGNOSIS — R7309 Other abnormal glucose: Secondary | ICD-10-CM | POA: Diagnosis not present

## 2016-09-17 DIAGNOSIS — I10 Essential (primary) hypertension: Secondary | ICD-10-CM | POA: Diagnosis not present

## 2016-09-17 DIAGNOSIS — N281 Cyst of kidney, acquired: Secondary | ICD-10-CM | POA: Diagnosis not present

## 2016-09-17 DIAGNOSIS — Z8619 Personal history of other infectious and parasitic diseases: Secondary | ICD-10-CM | POA: Diagnosis not present

## 2016-09-17 DIAGNOSIS — Z9049 Acquired absence of other specified parts of digestive tract: Secondary | ICD-10-CM | POA: Diagnosis not present

## 2016-09-19 ENCOUNTER — Other Ambulatory Visit: Payer: Self-pay | Admitting: *Deleted

## 2016-09-22 NOTE — Telephone Encounter (Signed)
LM for patient asking her to call back to clinic and make an appointment to discuss restarting medication. Naina Sleeper,CMA

## 2016-09-22 NOTE — Telephone Encounter (Signed)
Please ask pt to come in and be seen prior to restarting this medication.  If it cannot be with me then with another provider.

## 2016-09-23 NOTE — Telephone Encounter (Signed)
Patient has an appointment on 10-17-16 with MD. [name]

## 2016-09-24 ENCOUNTER — Other Ambulatory Visit: Payer: Self-pay

## 2016-09-24 MED ORDER — LIDOCAINE 5 % EX PTCH: 2.0000 | MEDICATED_PATCH | CUTANEOUS | 5 refills | Status: DC

## 2016-10-06 ENCOUNTER — Telehealth: Payer: Self-pay | Admitting: *Deleted

## 2016-10-06 NOTE — Telephone Encounter (Signed)
[facility] Pina [facility] about [facility] on [name] lidocaine patch. It has been denied. [facility] and [facility] should [facility] pt to OTC lidocaine patches.

## 2016-10-08 DIAGNOSIS — Z1231 Encounter for screening mammogram for malignant neoplasm of breast: Secondary | ICD-10-CM | POA: Diagnosis not present

## 2016-10-08 DIAGNOSIS — Z01419 Encounter for gynecological examination (general) (routine) without abnormal findings: Secondary | ICD-10-CM | POA: Diagnosis not present

## 2016-10-08 DIAGNOSIS — Z780 Asymptomatic menopausal state: Secondary | ICD-10-CM | POA: Diagnosis not present

## 2016-10-17 ENCOUNTER — Ambulatory Visit: Payer: Medicare HMO | Admitting: Family Medicine

## 2016-11-04 ENCOUNTER — Ambulatory Visit (INDEPENDENT_AMBULATORY_CARE_PROVIDER_SITE_OTHER): Payer: Medicare Other | Admitting: Family Medicine

## 2016-11-04 ENCOUNTER — Encounter: Payer: Self-pay | Admitting: Family Medicine

## 2016-11-04 VITALS — BP 122/66 | HR 86 | Temp 98.1°F | Ht 67.0 in | Wt 169.6 lb

## 2016-11-04 DIAGNOSIS — G459 Transient cerebral ischemic attack, unspecified: Secondary | ICD-10-CM

## 2016-11-04 DIAGNOSIS — E785 Hyperlipidemia, unspecified: Secondary | ICD-10-CM

## 2016-11-04 DIAGNOSIS — R233 Spontaneous ecchymoses: Secondary | ICD-10-CM

## 2016-11-04 DIAGNOSIS — R238 Other skin changes: Secondary | ICD-10-CM | POA: Diagnosis not present

## 2016-11-04 MED ORDER — ESOMEPRAZOLE MAGNESIUM 20 MG PO CPDR: 20.0000 mg | DELAYED_RELEASE_CAPSULE | Freq: Every day | ORAL | 5 refills | Status: DC

## 2016-11-04 MED ORDER — SERTRALINE HCL 50 MG PO TABS: 50.0000 mg | ORAL_TABLET | Freq: Two times a day (BID) | ORAL | 11 refills | Status: DC

## 2016-11-04 NOTE — Patient Instructions (Signed)
It was nice meeting you today! I have checked your bloodwork given your easy bruising and will follow up with you with the results.   Please schedule a lab visit to check your cholesterol within the next 1-2 weeks.     Be well,  Lovenia Kim, MD

## 2016-11-04 NOTE — Progress Notes (Addendum)
Subjective:   Patient ID: [name]    DOB: 22-Jan-1951, 66 y.o. female   MRN: [id number]  CC: meet new pcp, medication refills   HPI: [name] is a 66 y.o. female who presents to clinic today to meet new pcp and for medication refills.  Discussed the following issues:   Easy bruising  History of stroke in August 2015; she has been taking Plavix 75 mg daily but wondering if she could take a lower dose as she has noted easy bruising.   - no recent history of falls or hitting head.  She reports that the smallest amount of trauma will cause bruising under the skin.  She also reports itching her skin through her pants causes bruising.   -Denies any history of abuse in the home and feels safe   H/o stroke -not currently taking Lipitor 40 mg as of Sept 2017 as it causes myalgias and she was not tolerating well  -taking Plavix 75 mg daily   ROS: See HPI for pertinent ROS. [place]: Pertinent past medical, surgical, family, and social history were reviewed and updated as appropriate. Smoking status reviewed. Medications reviewed. Objective:   BP 122/66   Pulse 86   Temp 98.1 F (36.7 C) (Oral)   Ht 5\' 7"  (1.702 m)   Wt 169 lb 9.6 oz (76.9 kg)   SpO2 96%   BMI 26.56 kg/m  Vitals and nursing note reviewed.  General: pleasant 66 yo female, NAD  CV: regular rate and rhythm without murmurs rubs or gallops Lungs: clear to auscultation bilaterally with normal work of breathing Skin: multiple ecchymoses present over bilateral upper extremities and lower legs  Extremities: warm and well perfused, no edema or tenderness noted   Assessment & Plan:   Easy bruising  Has noticed very easy bruising with minor trauma to skin. Suspect this is likely 2/2 taking Plavix 75 mg for history of stroke in 2015. Last CBC with stable plt count of 250.   -recommend continue Plavix for now  -Will recheck CBC, PT and INR today    TIA (transient ischemic attack) Stable.  Taking Plavix daily  however reports not currently taking Lipitor due to statin intolerance (myalgias).   -will schedule for lipid panel labs this week; may consider trial of alternate day statin if she agrees  Orders Placed This Encounter  Procedures  . CBC  . Lipid panel    Standing Status:   Future    Number of Occurrences:   1    Standing Expiration Date:   11/04/2017  . Protime-INR  . PT AND PTT   Meds ordered this encounter  Medications  . DISCONTD: esomeprazole (NEXIUM) 20 MG capsule    Sig: Take 1 capsule (20 mg total) by mouth daily.    Dispense:  30 capsule    Refill:  5  . DISCONTD: sertraline (ZOLOFT) 50 MG tablet    Sig: Take 1 tablet (50 mg total) by mouth 2 (two) times daily.    Dispense:  60 tablet    Refill:  11  . esomeprazole (NEXIUM) 20 MG capsule    Sig: Take 1 capsule (20 mg total) by mouth daily.    Dispense:  30 capsule    Refill:  5  . sertraline (ZOLOFT) 50 MG tablet    Sig: Take 1 tablet (50 mg total) by mouth 2 (two) times daily.    Dispense:  60 tablet    Refill:  11   Follow up:  2 months   Lovenia Kim, MD [place], PGY-2 11/10/2016 5:04 PM

## 2016-11-05 ENCOUNTER — Other Ambulatory Visit: Payer: Medicare Other

## 2016-11-05 DIAGNOSIS — E785 Hyperlipidemia, unspecified: Secondary | ICD-10-CM | POA: Diagnosis not present

## 2016-11-05 LAB — CBC
Hematocrit: 42.2 % (ref 34.0–46.6)
Hemoglobin: 14 g/dL (ref 11.1–15.9)
MCH: 29.2 pg (ref 26.6–33.0)
MCHC: 33.2 g/dL (ref 31.5–35.7)
MCV: 88 fL (ref 79–97)
Platelets: 235 10*3/uL (ref 150–379)
RBC: 4.8 x10E6/uL (ref 3.77–5.28)
RDW: 14.6 % (ref 12.3–15.4)
WBC: 5.9 10*3/uL (ref 3.4–10.8)

## 2016-11-05 LAB — PT AND PTT
INR: 0.9 (ref 0.8–1.2)
Prothrombin Time: 9.7 s (ref 9.1–12.0)
aPTT: 29 s (ref 24–33)

## 2016-11-06 LAB — LIPID PANEL
Chol/HDL Ratio: 4.6 ratio — ABNORMAL HIGH (ref 0.0–4.4)
Cholesterol, Total: 255 mg/dL — ABNORMAL HIGH (ref 100–199)
HDL: 55 mg/dL (ref >39)
LDL Calculated: 174 mg/dL — ABNORMAL HIGH (ref 0–99)
Triglycerides: 129 mg/dL (ref 0–149)
VLDL Cholesterol Cal: 26 mg/dL (ref 5–40)

## 2016-11-10 DIAGNOSIS — R233 Spontaneous ecchymoses: Secondary | ICD-10-CM | POA: Insufficient documentation

## 2016-11-10 DIAGNOSIS — R238 Other skin changes: Secondary | ICD-10-CM | POA: Insufficient documentation

## 2016-11-10 NOTE — Assessment & Plan Note (Addendum)
Has noticed very easy bruising with minor trauma to skin. Suspect this is likely 2/2 taking Plavix 75 mg for history of stroke in 2015. Last CBC with stable plt count of 250.   -recommend continue Plavix for now  -Will recheck CBC, PT and INR today

## 2016-11-10 NOTE — Assessment & Plan Note (Signed)
Stable.  Taking Plavix daily however reports not currently taking Lipitor due to statin intolerance (myalgias).   -will schedule for lipid panel labs this week; may consider trial of alternate day statin if she agrees

## 2016-11-20 ENCOUNTER — Other Ambulatory Visit: Payer: Self-pay | Admitting: Neurology

## 2016-12-16 IMAGING — CR DG CHEST 2V
2 series · 2 of 2 positions shown · non-contrast
Comparison: 01/08/2016

CLINICAL DATA: Pneumonia.

EXAM:
CHEST  2 VIEW

[chest pa]
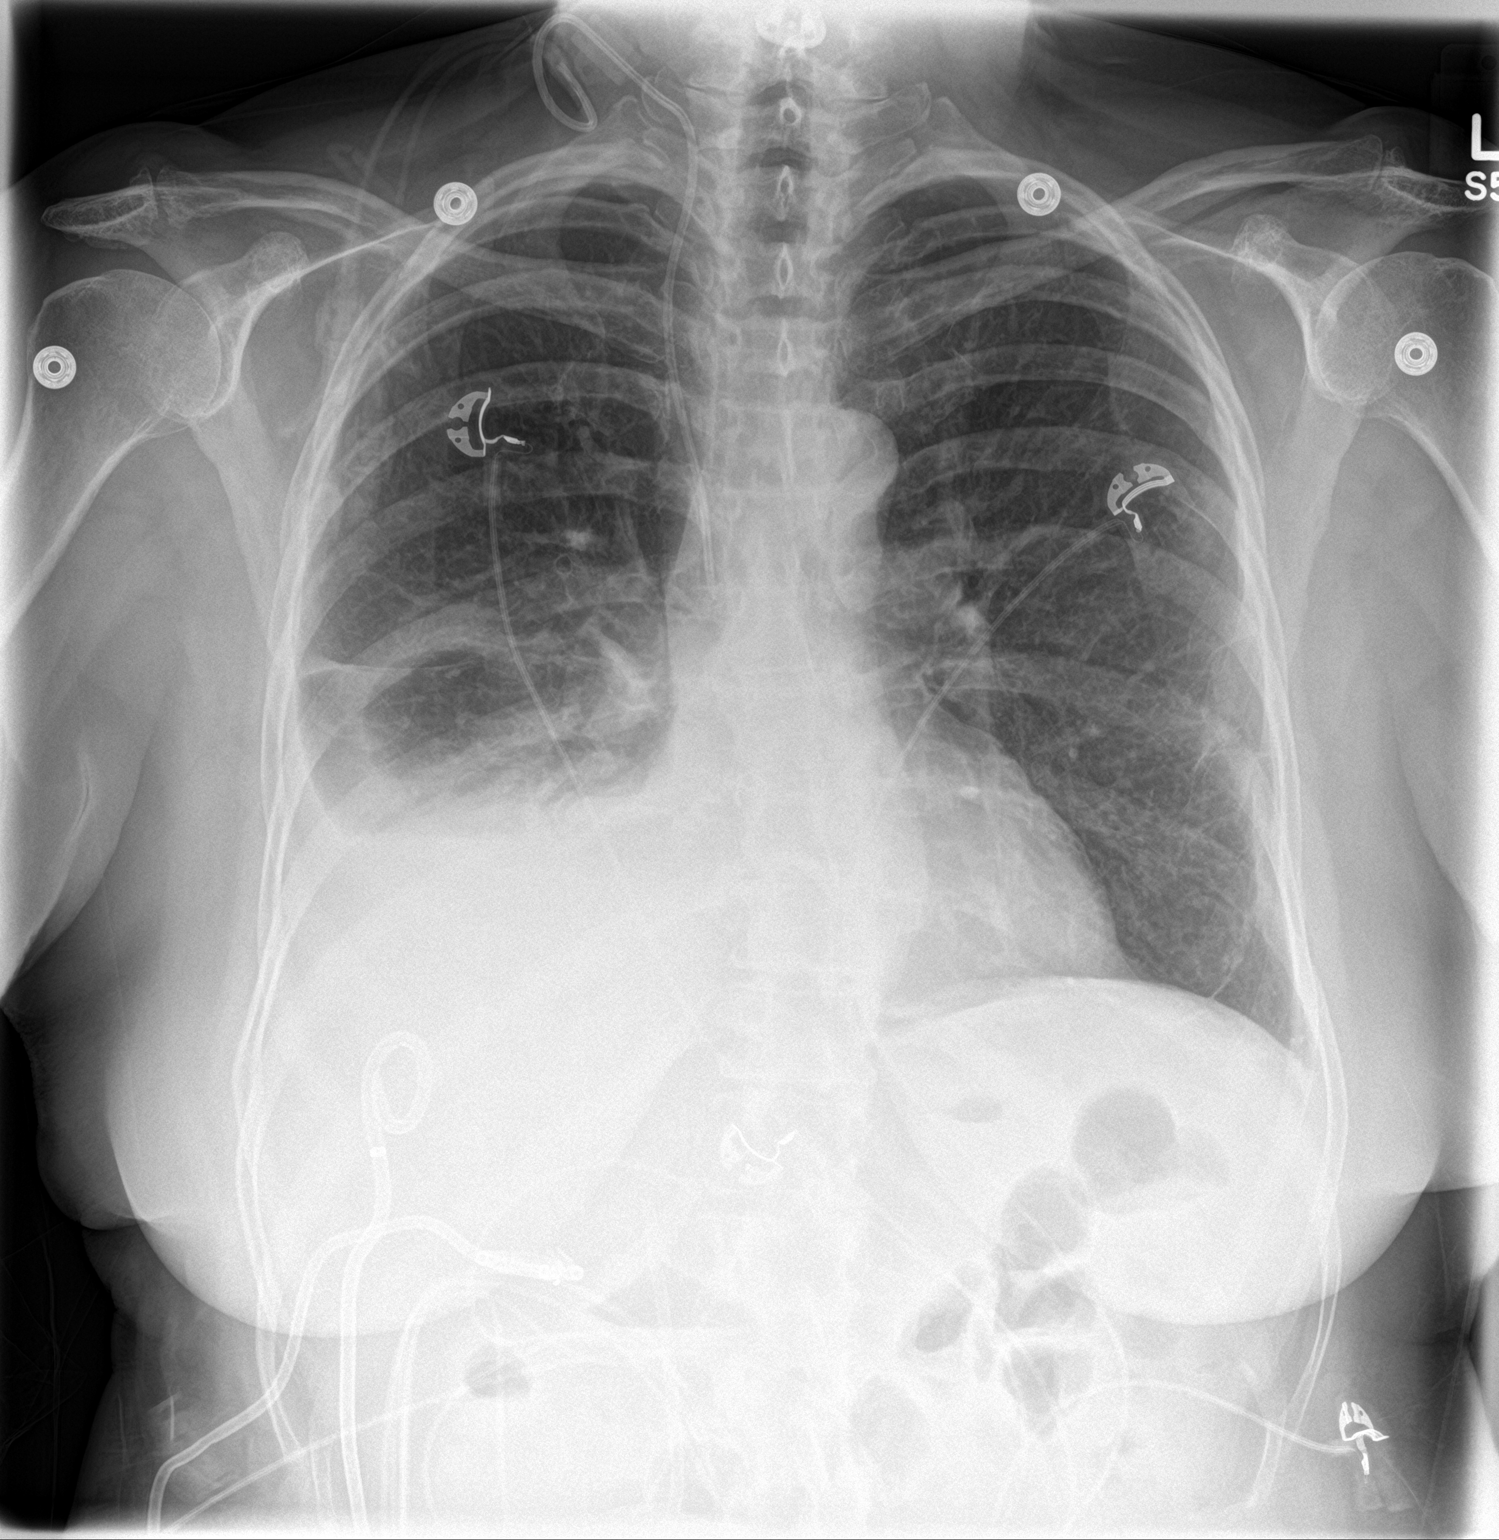

[chest lat]
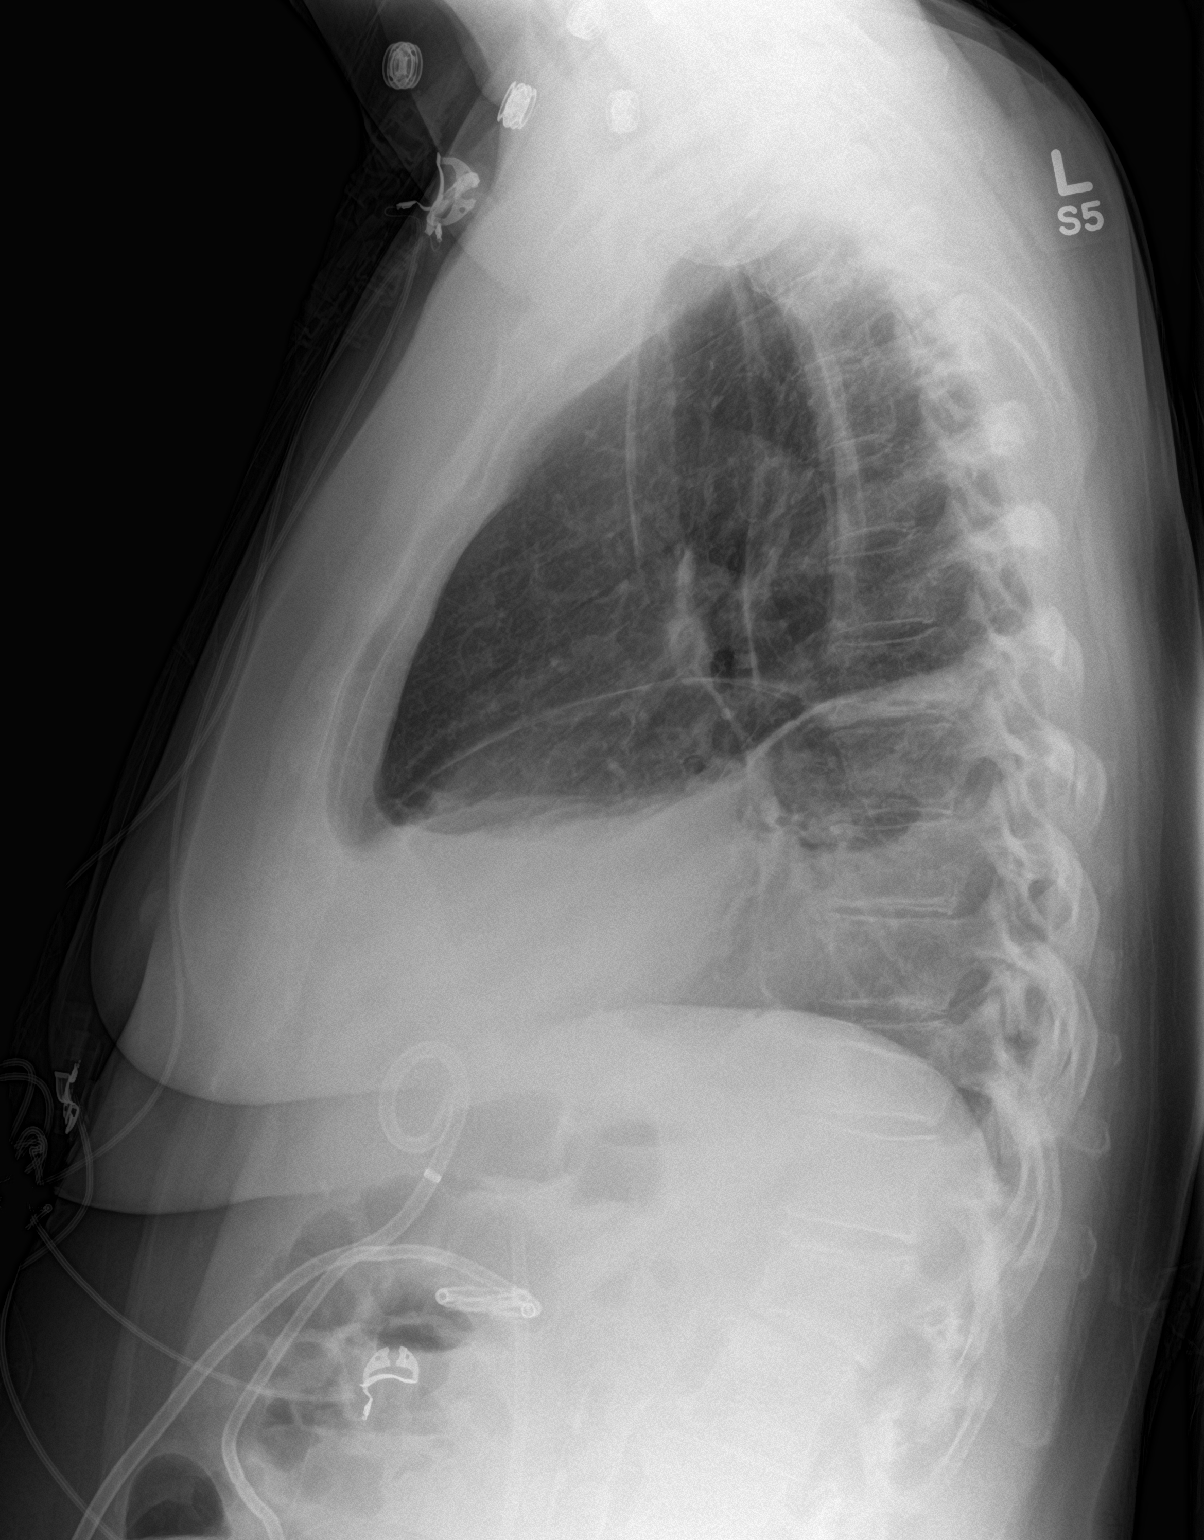

[2 of 2 positions shown; findings below may reference images not displayed]

FINDINGS: Progressive right lower lobe airspace disease and small right
pleural effusion since the prior study.

Mild scarring in the left lung base. No infiltrate or effusion on
the left. Negative for heart failure. Right jugular central venous
catheter tip in the SVC unchanged.

Surgical drains and pigtail catheters overlying the liver.
IMPRESSION: Progressive right lower lobe airspace disease and right pleural
effusion. Left lung remains clear.

## 2016-12-18 IMAGING — DX DG CHEST 2V
2 series · 2 of 2 positions shown · non-contrast
Comparison: 01/11/2016

CLINICAL DATA: Pneumonia, shortness of breath and cough for 1 week,
former smoker, prior thoracentesis twice in December 2015

EXAM:
CHEST  2 VIEW

[chest pa]
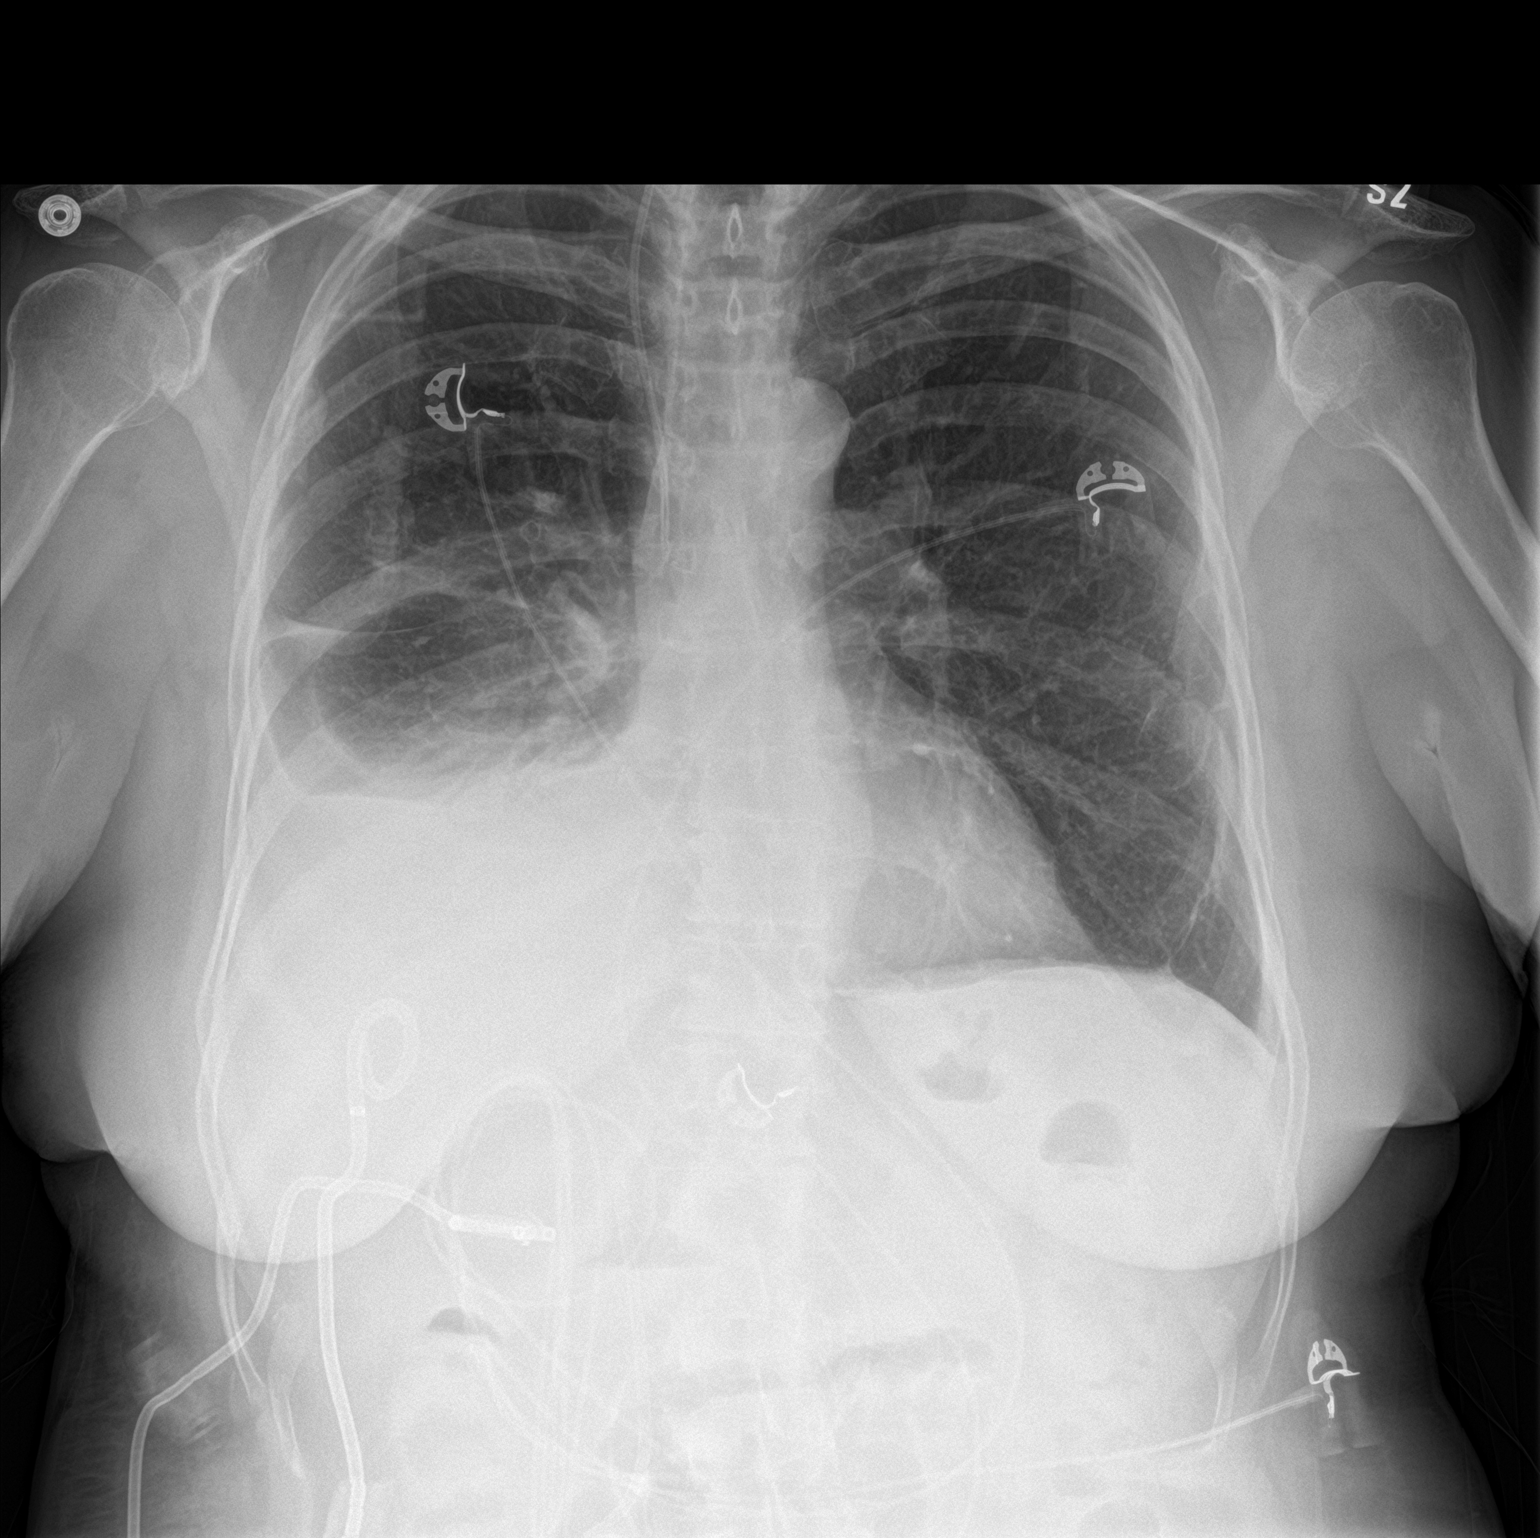

[chest lat]
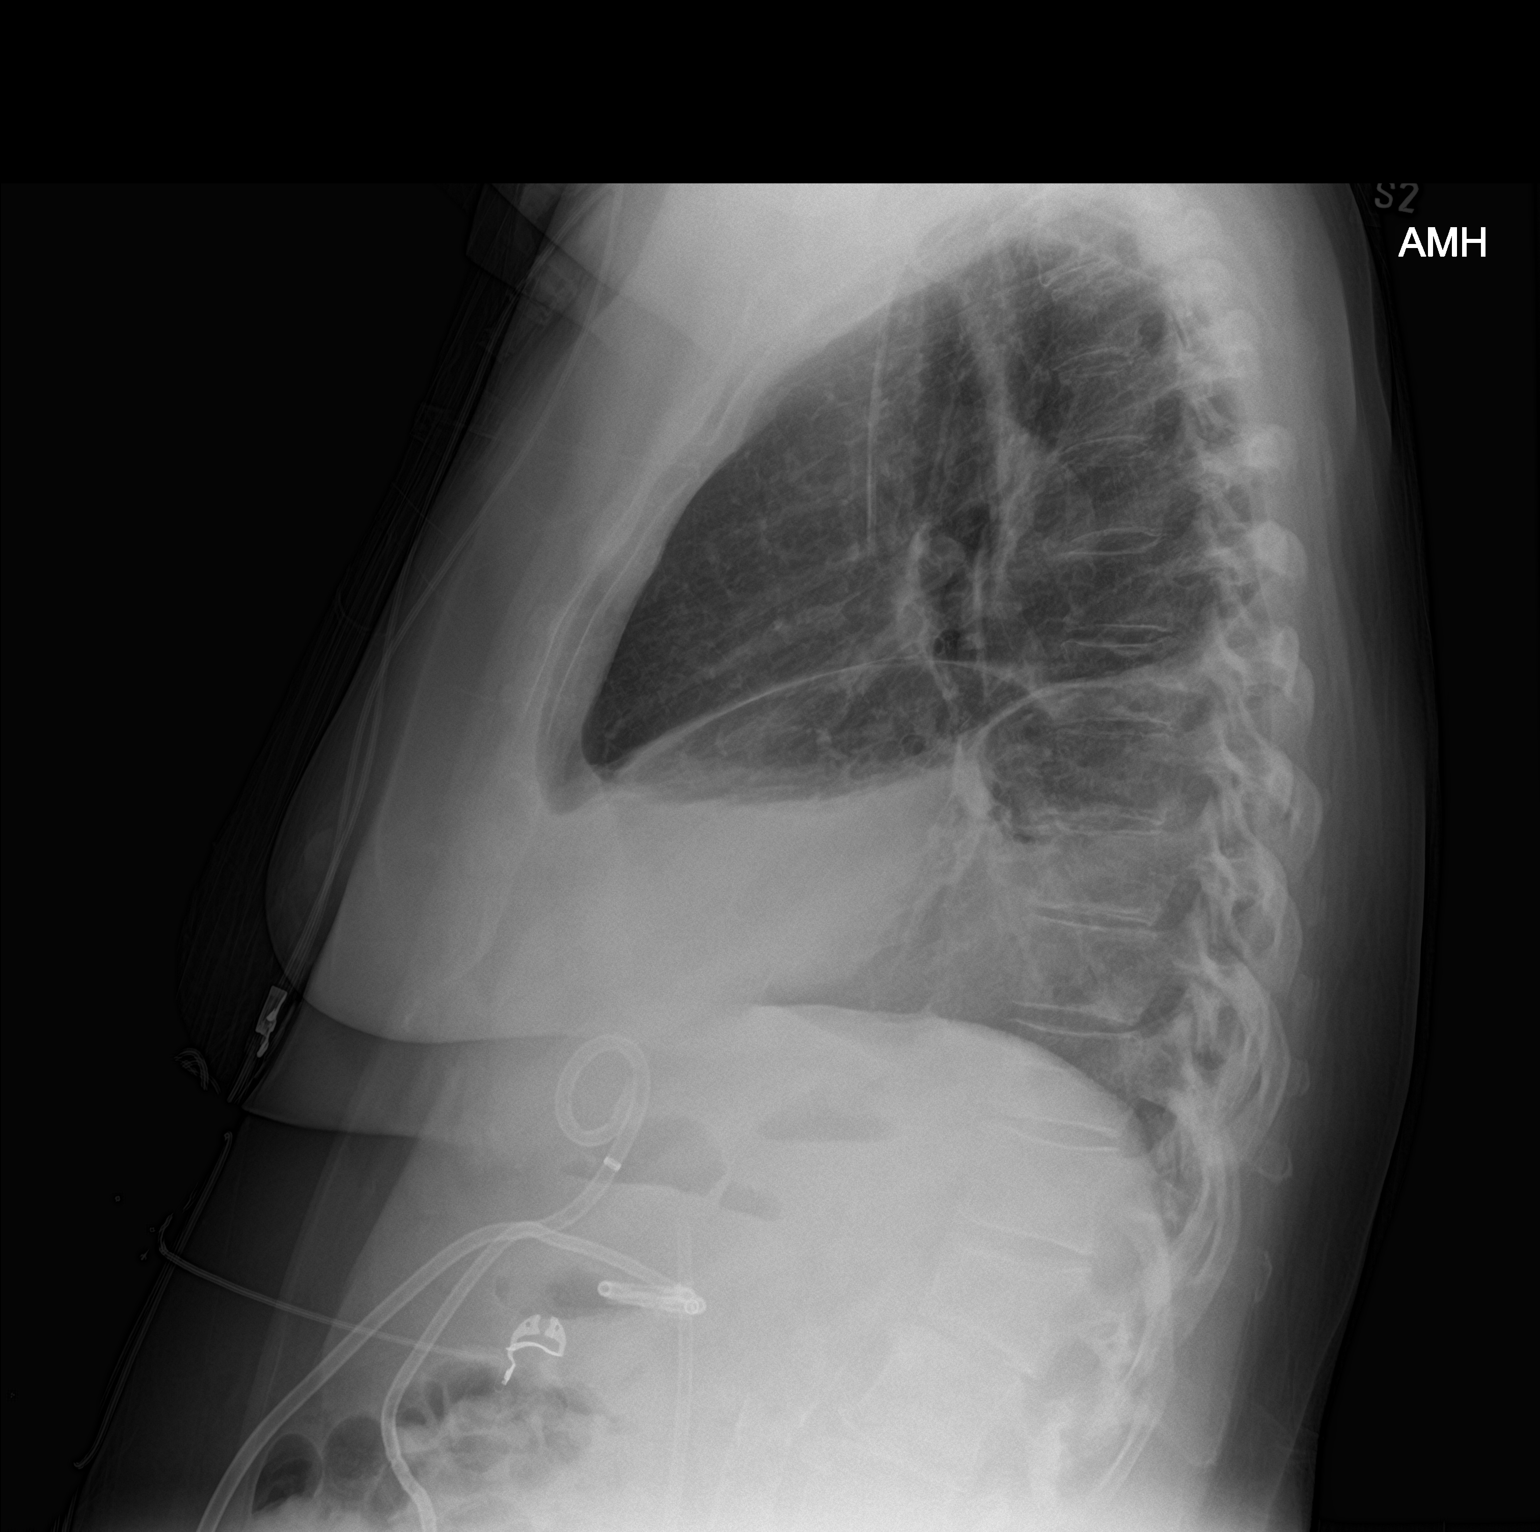

[2 of 2 positions shown; findings below may reference images not displayed]

FINDINGS: RIGHT jugular central venous catheter tip projecting over SVC.

Normal heart size, mediastinal contours, and pulmonary vascularity.

Stable small moderate RIGHT pleural effusion with basilar
atelectasis.

Underlying emphysematous and bronchitic changes.

Scarring at lateral mid to lower LEFT lung unchanged.

No acute infiltrate or pneumothorax.

Bones demineralized.

Pigtail drains RIGHT upper quadrant unchanged.
IMPRESSION: COPD changes with persistent RIGHT pleural effusion and basilar
atelectasis.

Lateral LEFT lung base scarring.

## 2016-12-19 DIAGNOSIS — M170 Bilateral primary osteoarthritis of knee: Secondary | ICD-10-CM | POA: Diagnosis not present

## 2016-12-19 DIAGNOSIS — M1711 Unilateral primary osteoarthritis, right knee: Secondary | ICD-10-CM | POA: Diagnosis not present

## 2016-12-19 DIAGNOSIS — M1712 Unilateral primary osteoarthritis, left knee: Secondary | ICD-10-CM | POA: Diagnosis not present

## 2016-12-24 ENCOUNTER — Telehealth: Payer: Self-pay | Admitting: *Deleted

## 2016-12-24 NOTE — Telephone Encounter (Signed)
Called to check on status of SRF yesterday. Aimovig ally stated form incomplete. No one contacted our office to let us know. They apologized for any inconvienance. I resent form with corrected information to hub.

## 2016-12-26 NOTE — Telephone Encounter (Signed)
Per Cover My Meds Lidocaine Patches 5% were  Approvedon August 8  CaseId:[id number];Status:Approved;Review Type:Prior Auth;Coverage Start Date:09/15/2016;Coverage End Date:10/15/2017;

## 2016-12-31 ENCOUNTER — Encounter: Payer: Self-pay | Admitting: Adult Health

## 2016-12-31 ENCOUNTER — Ambulatory Visit (INDEPENDENT_AMBULATORY_CARE_PROVIDER_SITE_OTHER): Payer: Medicare Other | Admitting: Adult Health

## 2016-12-31 VITALS — BP 123/83 | HR 72 | Ht 67.0 in | Wt 168.0 lb

## 2016-12-31 DIAGNOSIS — R519 Headache, unspecified: Secondary | ICD-10-CM

## 2016-12-31 DIAGNOSIS — G43009 Migraine without aura, not intractable, without status migrainosus: Secondary | ICD-10-CM

## 2016-12-31 DIAGNOSIS — R51 Headache: Secondary | ICD-10-CM | POA: Diagnosis not present

## 2016-12-31 MED ORDER — TIZANIDINE HCL 2 MG PO TABS: 2.0000 mg | ORAL_TABLET | Freq: Three times a day (TID) | ORAL | 1 refills | Status: DC

## 2016-12-31 NOTE — Progress Notes (Signed)
I have read the note, and I agree with the clinical assessment and plan.  Jovane Foutz KEITH   

## 2016-12-31 NOTE — Patient Instructions (Signed)
Your Plan:  Continue Tizanidine, zonegran and doxepin If not heard about aimovig in 1 week please call If your symptoms worsen or you develop new symptoms please let us know.    Thank you for coming to see [place] at [facility]. I hope we have been able to provide you high quality care today.  You may receive a patient satisfaction survey over the next few weeks. We would appreciate your feedback and comments so that we may continue to improve ourselves and the health of our patients.

## 2016-12-31 NOTE — Progress Notes (Signed)
PATIENT: [name] DOB: 1950-11-19  REASON FOR VISIT: follow up- migraine HISTORY FROM: patient  HISTORY OF PRESENT ILLNESS: Today 12/31/16 [name] is a 66 year old female with a history of intractable migraine headaches. She returns today for follow-up. She is currently on Zonegran and tizanidine. She reports that she has a true migraine every 3 months. She continues to have a dull daily headache. Her headaches  occur on the right side. With her severe headaches she does have photophobia, phonophobia and nausea and vomiting. She states that she had a bad migraine Sunday and was in bed all day on Monday and she is just now starting to feel better. She does note that stress is a trigger for her migraines.Aimovig was ordered for the patient however she has not received this. She returns today for follow-up  HISTORY [name] is a 66 year old right-handed white female with a history of intractable migraine headache. The patient got two injections of Botox, she decided not to continue the treatments because of lack of response. The patient is on doxepin, gabapentin, and Zonegran without complete control of the headaches. She has had one severe headache since last seen in January 2018, but she continues to have daily headache involving the right frontotemporal region mainly, she indicates that the headache never completely goes away. The patient is having difficulty sleeping because of the headache pain, she will sleep for 3 hours and then wake up, she may be able to get back to sleep for another 2 hours a night, but she has had chronic issues with insomnia even while on doxepin. The patient occasionally will have severe nausea and vomiting with the headache. She returns for an evaluation.   REVIEW OF SYSTEMS: Out of a complete 14 system review of symptoms, the patient complains only of the following symptoms, and all other reviewed systems are negative.  Fatigue, excessive sweating, light  sensitivity, double vision, blurred vision, shortness of breath, murmur, restless leg, insomnia, heat intolerance, excessive thirst, food allergies, incontinence of bladder, muscle cramps, agitation, confusion, depression, nervous/anxious, hyperactivity, memory loss, dizziness, headache, numbness  ALLERGIES: Allergies  Allergen Reactions  . Shellfish Allergy Anaphylaxis and Other (See Comments)    All seafood  . Iodinated Diagnostic Agents   . Statins     HOME MEDICATIONS: Outpatient Medications Prior to Visit  Medication Sig Dispense Refill  . atorvastatin (LIPITOR) 40 MG tablet TAKE 1 TABLET (40 MG TOTAL) BY MOUTH DAILY. 90 tablet 3  . clopidogrel (PLAVIX) 75 MG tablet TAKE 1 TABLET BY MOUTH DAILY 30 tablet 5  . doxepin (SINEQUAN) 100 MG capsule Take 2 capsules (200 mg total) by mouth at bedtime. 180 capsule 1  . esomeprazole (NEXIUM) 20 MG capsule Take 1 capsule (20 mg total) by mouth daily. 30 capsule 5  . furosemide (LASIX) 20 MG tablet Take 20 mg by mouth daily.   6  . gabapentin (NEURONTIN) 600 MG tablet Take 1 tablet (600 mg total) by mouth 2 (two) times daily. 180 tablet 1  . lidocaine (LIDODERM) 5 % Place 2 patches onto the skin daily. Remove & Discard patch within 12 hours or as directed by MD 60 patch 5  . meclizine (ANTIVERT) 25 MG tablet Take 1 tablet (25 mg total) by mouth 2 (two) times daily as needed for dizziness. 20 tablet 0  . ramipril (ALTACE) 2.5 MG capsule Do not take this till you go back to the Clinic and they ask you to restart it. (Patient taking differently: Take  2.5 mg by mouth daily. Do not take this till you go back to the Clinic and they ask you to restart it.) 90 capsule 3  . sertraline (ZOLOFT) 50 MG tablet Take 1 tablet (50 mg total) by mouth 2 (two) times daily. 60 tablet 11  . tiZANidine (ZANAFLEX) 2 MG tablet TAKE 1 TABLET 3 TIMES A DAY 270 tablet 0  . zonisamide (ZONEGRAN) 100 MG capsule TAKE 1 CAPSULE BY MOUTH TWICE A DAY 60 capsule 11  . zonisamide  (ZONEGRAN) 25 MG capsule Take an extra 25 mg twice a day for 2 weeks, then take 50 mg twice a day for a total of 150 mg twice a day. 120 capsule 11   No facility-administered medications prior to visit.     PAST MEDICAL HISTORY: Past Medical History:  Diagnosis Date  . Chronic kidney disease    stage 3  . GERD (gastroesophageal reflux disease)   . Hyperlipidemia   . Migraine headache   . Occipital neuralgia    Left  . Renal insufficiency   . Stroke [facility]) 10/2013    PAST SURGICAL HISTORY: Past Surgical History:  Procedure Laterality Date  . ABDOMINAL HYSTERECTOMY    . BILIARY STENT PLACEMENT N/A 12/12/2015   Procedure: BILIARY STENT PLACEMENT;  Surgeon: Doran Stabler, MD;  Location: [place] ENDOSCOPY;  Service: Endoscopy;  Laterality: N/A;  . BLADDER REPAIR    . CARPAL TUNNEL RELEASE    . CERVICAL SPINE SURGERY    . CHOLECYSTECTOMY N/A 12/06/2015   Procedure: LAPAROSCOPIC CHOLECYSTECTOMY WITH  INTRAOPERATIVE CHOLANGIOGRAM;  Surgeon: Stark Klein, MD;  Location: [place];  Service: General;  Laterality: N/A;  . ELBOW SURGERY    . ERCP N/A 12/12/2015   Procedure: ENDOSCOPIC RETROGRADE CHOLANGIOPANCREATOGRAPHY (ERCP);  Surgeon: Doran Stabler, MD;  Location: [facility] ENDOSCOPY;  Service: Endoscopy;  Laterality: N/A;  . ESOPHAGOGASTRODUODENOSCOPY N/A 01/28/2016   Procedure: ESOPHAGOGASTRODUODENOSCOPY (EGD)Biliary STENT removal Surgeon: Doran Stabler, MD;  Location: [facility] ENDOSCOPY;  Service: Gastroenterology;  Laterality: N/A;  . IR GENERIC HISTORICAL  12/17/2015   IR US GUIDE BX ASP/DRAIN 12/17/2015 [facility]INTER[facility]  . IR GENERIC HISTORICAL  12/17/2015   IR GUIDED DRAIN W CATHETER PLACEMENT 12/17/2015 [facility]  . IR GENERIC HISTORICAL  12/17/2015   IR SINUS/FIST TUBE CHK-NON GI 12/17/2015 [facility]  . KNEE SURGERY    . LUNG SURGERY    . TEE WITHOUT CARDIOVERSION N/A 12/19/2015   Procedure: TRANSESOPHAGEAL ECHOCARDIOGRAM (TEE);  Surgeon: Josue Hector, MD;  Location: [facility] ENDOSCOPY;   Service: Cardiovascular;  Laterality: N/A;    FAMILY HISTORY: Family History  Problem Relation Age of Onset  . Cancer Mother 31       breast  . Alzheimer's disease Mother   . Cancer Brother   . Hyperlipidemia Brother   . Prostate cancer Father   . Lung cancer Father     SOCIAL HISTORY: Social History   Social History  . Marital status: Married    Spouse name: [name]  . Number of children: 1  . Years of education: 12+   Occupational History  . [employer]  . CNA    Social History Main Topics  . Smoking status: Former Smoker    Packs/day: 0.30    Types: Cigarettes    Quit date: 11/01/2011  . Smokeless tobacco: Never Used  . Alcohol use No  . Drug use: No  . Sexual activity: Yes    Partners: Male  Birth control/ protection: Surgical     Comment: 1 sexual partner in last 12 months: married   Other Topics Concern  . Not on file   Social History Narrative   Patient is married ([name]).   Patient has one child.   Patient is working full-time.   Patient is right handed.   Patient has a college education.   Patient does not drink caffeine.      PHYSICAL EXAM  Vitals:   12/31/16 0721  BP: 123/83  Pulse: 72  Weight: 168 lb (76.2 kg)  Height: 5\' 7"  (1.702 m)   Body mass index is 26.31 kg/m.  Generalized: Well developed, in no acute distress   Neurological examination  Mentation: Alert oriented to time, place, history taking. Follows all commands speech and language fluent Cranial nerve II-XII: Pupils were equal round reactive to light. Extraocular movements were full, visual field were full on confrontational test. Facial sensation and strength were normal. Uvula tongue midline. Head turning and shoulder shrug  were normal and symmetric. Motor: The motor testing reveals 5 over 5 strength of all 4 extremities. Good symmetric motor tone is noted throughout.  Sensory: Sensory testing is intact to soft touch on all 4 extremities. No  evidence of extinction is noted.  Coordination: Cerebellar testing reveals good finger-nose-finger and heel-to-shin bilaterally.  Gait and station: Gait is normal. Tandem gait is normal. Romberg is negative. No drift is seen.  Reflexes: Deep tendon reflexes are symmetric and normal bilaterally.   DIAGNOSTIC DATA (LABS, IMAGING, TESTING) - I reviewed patient records, labs, notes, testing and imaging myself where available.  Lab Results  Component Value Date   WBC 5.9 11/04/2016   HGB 14.0 11/04/2016   HCT 42.2 11/04/2016   MCV 88 11/04/2016   PLT 235 11/04/2016      Component Value Date/Time   NA 141 02/25/2016 0907   K 4.3 02/25/2016 0907   CL 108 02/25/2016 0907   CO2 23 02/25/2016 0907   GLUCOSE 99 02/25/2016 0907   BUN 16 02/25/2016 0907   CREATININE 1.10 (H) 02/25/2016 0907   CALCIUM 9.0 02/25/2016 0907   PROT 5.3 (L) 01/10/2016 1600   ALBUMIN 2.2 (L) 01/10/2016 1600   AST 26 01/10/2016 1600   ALT 34 01/10/2016 1600   ALKPHOS 160 (H) 01/10/2016 1600   BILITOT 0.4 01/10/2016 1600   GFRNONAA 53 (L) 02/25/2016 0907   GFRAA 61 02/25/2016 0907   Lab Results  Component Value Date   CHOL 255 (H) 11/05/2016   HDL 55 11/05/2016   LDLCALC 174 (H) 11/05/2016   TRIG 129 11/05/2016   CHOLHDL 4.6 (H) 11/05/2016   Lab Results  Component Value Date   HGBA1C 5.9 (H) 10/10/2013   No results found for: VITAMINB12 Lab Results  Component Value Date   TSH 1.402 12/25/2014      ASSESSMENT AND PLAN 66 y.o. year old female  has a past medical history of Chronic kidney disease; GERD (gastroesophageal reflux disease); Hyperlipidemia; Migraine headache; Occipital neuralgia; Renal insufficiency; and Stroke ([place]) (10/2013). here with:  1. Migraine  Overall the patient has remained stable. She will continue on Zonegran, tizanidine and Doxepin. I have advised that if she is not heard from her pharmacy in one week in regards to [place] please let us know. We may have to switch the  patient to Ajovy if she is unable to get Aimovig. Patient is amenable to this plan. She will follow-up in 6 months or sooner if needed.  I  spent 15 minutes with the patient. 50% of this time was spent discussing medication.      Ward Givens, MSN, NP-C 12/31/2016, 7:20 AM [facility] [address] [place], [place] [address] [phone]

## 2017-01-02 ENCOUNTER — Encounter: Payer: Self-pay | Admitting: Family Medicine

## 2017-01-02 ENCOUNTER — Ambulatory Visit (INDEPENDENT_AMBULATORY_CARE_PROVIDER_SITE_OTHER): Payer: Medicare Other | Admitting: Family Medicine

## 2017-01-02 DIAGNOSIS — Z23 Encounter for immunization: Secondary | ICD-10-CM | POA: Diagnosis present

## 2017-01-02 DIAGNOSIS — H9201 Otalgia, right ear: Secondary | ICD-10-CM | POA: Insufficient documentation

## 2017-01-02 DIAGNOSIS — E7849 Other hyperlipidemia: Secondary | ICD-10-CM | POA: Diagnosis not present

## 2017-01-02 NOTE — Assessment & Plan Note (Signed)
-  Plan to restart Lipitor 40 mg with alternate day dosing -Pt to return to clinic in 4-6 weeks to check LFTs -Return precautions discussed

## 2017-01-02 NOTE — Assessment & Plan Note (Signed)
Could consider trigeminal neuralgia although symptoms do not seem as severe in quality.  Denies stabbing pain with light sensation.  Infection as etiology is unlikely at this time given absence of fevers, no area of fluctuance and otherwise asymptomatic.  Possible this is related to a nerve issue and will benefit from increase in home Gabapentin 600 mg BID to TID.  Per chart review she does have an extensive history of neuralgias.  -If does not improve, could consider further discussing with Neurology -RTC if worsening symptoms

## 2017-01-02 NOTE — Progress Notes (Signed)
   Subjective:   Patient ID: [name]    DOB: 1950/11/14, 66 y.o. female   MRN: [id number]  CC:  R ear pain, f/u labs   HPI: [name] is a 66 y.o. female who presents to clinic today for f/u lab work and right ear pain.  Hyperlipidemia  -Following stroke in August 2015 she was started on Lipitor 40 mg -About 3-4 months into therapy she began experiencing myalgias primarily in thighs and arms -Began taking statin every other day and was advised to continue  -Has not been taking it for past 4 months  -Eats fairly healthy at home and is active   Right ear pain  -Symptoms began last Friday -- pain is sometimes constant and sometimes has shooting pain down side of her neck -pain is sharp in nature, like lightning but does not feel like a stabbing pain  -Denies fever, chills, nausea, vomiting  -Does not note changes in hearing  -no falls or trauma, denies dizziness and ringing in ears  -Denies tooth pain, jaw pain or pain while chewing  -no recent illnesses  -denies congestion, runny nose, sometimes has migraines - last migraine was Sunday and Monday (Last saw Neurologist on Wednesday) -Has not tried taking anything for the pain, but she is on Gabapentin 600 mg BID.     Health Maintenance -due for flu shot  ROS: See HPI for pertinent ROS. [place]: Pertinent past medical, surgical, family, and social history were reviewed and updated as appropriate. Smoking status reviewed. Medications reviewed.  Objective:   BP 140/80   Pulse 68   Temp 98.6 F (37 C) (Oral)   Ht 5\' 7"  (1.702 m)   Wt 168 lb 3.2 oz (76.3 kg)   SpO2 99%   BMI 26.34 kg/m  Vitals and nursing note reviewed.  General: 66 year old female, NAD, sitting comfortably in exam room  HEENT: NCAT, MMM, o/p clear, TM bilaterally clear, no exudate, erythema or bulging, mild TTP with palpation over right posterior ear and tragus, no drainage or purulence noted, no areas of induration Neck: supple, no LAD  CV: RRR  no MRG  Lungs: CTAB, non-laboured  Skin: warm, dry Neuro: sensation intact bilaterally over face, hearing is equal bilaterally   Assessment & Plan:   Right ear pain Could consider trigeminal neuralgia although symptoms do not seem as severe in quality.  Denies stabbing pain with light sensation.  Infection as etiology is unlikely at this time given absence of fevers, no area of fluctuance and otherwise asymptomatic.  Possible this is related to a nerve issue and will benefit from increase in home Gabapentin 600 mg BID to TID.  Per chart review she does have an extensive history of neuralgias.  -If does not improve, could consider further discussing with Neurology -RTC if worsening symptoms  HLD (hyperlipidemia) -Plan to restart Lipitor 40 mg with alternate day dosing -Pt to return to clinic in 4-6 weeks to check LFTs -Return precautions discussed   Orders Placed This Encounter  Procedures  . Flu Vaccine QUAD 36+ mos IM   Follow up: 4-6 weeks   Lovenia Kim, MD [place], PGY-2 01/02/2017 2:42 PM

## 2017-01-02 NOTE — Patient Instructions (Signed)
It was nice seeing you today! You were seen in clinic for follow up on your labwork.  Your cholesterol levels are high and we discussed restarting your Lipitor 40 mg .  I would like you to take this every other day instead of daily as that may help with some of your symptoms of muscle pain.  In about 4-6 weeks I would like you to return so we can check your liver function.  If you experience muscle aches in the meantime please call the clinic and I may consider switching you to a different medication at that time.   For your ear pain, I do not think this is related to an infection.  It is possible that it could be nerve related and I am increasing your Gabapentin to 600 three times a day from twice a day.  If you have any changes in your hearing, experience ringing in your ears or worsening of your symptoms I would like for you to come back in and be seen.    Please call clinic if you have any questions.   Be well, Lovenia Kim, MD

## 2017-01-25 ENCOUNTER — Other Ambulatory Visit: Payer: Self-pay | Admitting: Neurology

## 2017-01-26 NOTE — Telephone Encounter (Signed)
Order signed.

## 2017-03-20 ENCOUNTER — Other Ambulatory Visit: Payer: Self-pay | Admitting: Neurology

## 2017-03-27 ENCOUNTER — Telehealth: Payer: Self-pay | Admitting: Family Medicine

## 2017-03-27 ENCOUNTER — Ambulatory Visit (INDEPENDENT_AMBULATORY_CARE_PROVIDER_SITE_OTHER): Payer: Medicare Other | Admitting: Family Medicine

## 2017-03-27 VITALS — BP 121/78 | HR 77 | Temp 98.5°F | Wt 171.8 lb

## 2017-03-27 DIAGNOSIS — E785 Hyperlipidemia, unspecified: Secondary | ICD-10-CM

## 2017-03-27 MED ORDER — ATORVASTATIN CALCIUM 10 MG PO TABS: 10.0000 mg | ORAL_TABLET | ORAL | 0 refills | Status: DC

## 2017-03-27 MED ORDER — EZETIMIBE 10 MG PO TABS: 10.0000 mg | ORAL_TABLET | Freq: Every day | ORAL | 0 refills | Status: DC

## 2017-03-27 NOTE — Telephone Encounter (Signed)
Pt calling concerning the medication she was prescribed at her appointment this morning with Amin. She went to the pharmacy and the medication was over $100 even with her insurance discount and she cannot afford that. She would like something in the place of it. Please notify patient when a different med has been sent to the pharmacy.

## 2017-03-27 NOTE — Progress Notes (Signed)
   Subjective:   Patient ID: [name]    DOB: 1951-01-02, 67 y.o. female   MRN: [id number]  CC: f/u labs   HPI: [name] is a 67 y.o. female who presents to clinic today to follow up on labs.    Hyperlipidemia Last seen October 2018 and lipid panel obtained.  Pt reports she is still taking Lipitor 40 mg every other day.  She reports good compliance and does not miss doses.  She endorses muscle cramping that is not present all the time but is painful when it does occur.  This occurs about 2-3 x a week usually while walking but sometimes also at rest.  She uses heating pads or massages it which eases the cramp and provides relief.  She denies other side effects of the medication.    ROS: Denies fevers, chills, nausea, vomiting, abdominal pain, diarrhea.  Reports occasional muscle cramping.  [place]: Pertinent past medical, surgical, family, and social history were reviewed and updated as appropriate. Smoking status reviewed. Medications reviewed. Objective:   BP 121/78   Pulse 77   Temp 98.5 F (36.9 C)   Wt 171 lb 12.8 oz (77.9 kg)   SpO2 96%   BMI 26.91 kg/m  Vitals and nursing note reviewed.  General: pleasant 67 yo female, NAD  Neck: supple, normal ROM  CV:RRR no MRG  Lungs: CTAB, non-laboured  Abdomen: soft, NTND, +bs  Skin: warm, dry, no rash  Extremities: warm and well perfused  Assessment & Plan:   HLD (hyperlipidemia) Continued myalgia 2/2 Lipitor 40 mg even with alternate day dosing. Lipid panel from 12/2016 with LDL 174 and total chol 255. Will need to be on a statin for secondary prevention due to her history of stroke.  Recommend cutting down to Lipitor 10 mg every other day and will add Zetia 10 mg.  Pt is amenable to trying this.  -Rx for Lipitor 10 mg -Start Zetia 10 mg daily -repeat direct LDL today -will recheck LDL in 3 months time to monitor progress  -return precautions discussed   Orders Placed This Encounter  Procedures  . LDL  cholesterol, direct   Meds ordered this encounter  Medications  . ezetimibe (ZETIA) 10 MG tablet    Sig: Take 1 tablet (10 mg total) by mouth daily.    Dispense:  90 tablet    Refill:  0  . atorvastatin (LIPITOR) 10 MG tablet    Sig: Take 1 tablet (10 mg total) by mouth every other day.    Dispense:  90 tablet    Refill:  0   Lovenia Kim, MD [place], PGY-2 03/27/2017 5:53 PM

## 2017-03-27 NOTE — Assessment & Plan Note (Addendum)
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 Lipid panel from 12/2016 with LDL 174 and total chol 255. Will need to be on a statin for secondary prevention due to her history of stroke.  Recommend cutting down to Lipitor 10 mg every other day and will add Zetia 10 mg.  Pt is amenable to trying this.  -Rx for Lipitor 10 mg -Start Zetia 10 mg daily -repeat direct LDL today -will recheck LDL in 3 months time to monitor progress  -return precautions discussed

## 2017-03-27 NOTE — Patient Instructions (Signed)
You were seen in clinic today for review of your cholesterol labs and they were still a little high.  I would like for you to stop taking the Lipitor 40 mg tablets and have prescribed a lower dose (10 mg) to be taken every other day.  Additionally, we added a medication called Zetia which should help lower your cholesterol better and will not cause the muscle pain you experiencing.  I have checked your LDL level today and will call you with the results.  I would like for you to follow up in 3 months to repeat labs.   Be well, Lovenia Kim, MD

## 2017-03-28 LAB — LDL CHOLESTEROL, DIRECT: LDL Direct: 156 mg/dL — ABNORMAL HIGH (ref 0–99)

## 2017-04-02 ENCOUNTER — Other Ambulatory Visit: Payer: Self-pay | Admitting: Family Medicine

## 2017-04-02 MED ORDER — ROSUVASTATIN CALCIUM 10 MG PO TABS: 10.0000 mg | ORAL_TABLET | Freq: Every day | ORAL | 0 refills | Status: DC

## 2017-04-02 NOTE — Telephone Encounter (Signed)
Discussed options for her with Dr. Valentina Lucks - switch to Crestor 10 mg daily vs medication assistance form for Zetia.  She prefers to try Crestor for now.  I have discontinued her Lipitor and Zetia and ordered Crestor to her pharmacy.  She will pick it up later this evening.  All questions answered and she was appreciate of the call.

## 2017-04-08 DIAGNOSIS — M179 Osteoarthritis of knee, unspecified: Secondary | ICD-10-CM | POA: Insufficient documentation

## 2017-04-08 DIAGNOSIS — M1710 Unilateral primary osteoarthritis, unspecified knee: Secondary | ICD-10-CM | POA: Insufficient documentation

## 2017-04-08 DIAGNOSIS — M170 Bilateral primary osteoarthritis of knee: Secondary | ICD-10-CM | POA: Diagnosis not present

## 2017-04-13 ENCOUNTER — Other Ambulatory Visit: Payer: Self-pay

## 2017-04-13 ENCOUNTER — Ambulatory Visit (INDEPENDENT_AMBULATORY_CARE_PROVIDER_SITE_OTHER): Payer: Medicare Other | Admitting: Internal Medicine

## 2017-04-13 DIAGNOSIS — J0110 Acute frontal sinusitis, unspecified: Secondary | ICD-10-CM

## 2017-04-13 MED ORDER — AMOXICILLIN-POT CLAVULANATE 875-125 MG PO TABS: 1.0000 | ORAL_TABLET | Freq: Two times a day (BID) | ORAL | 0 refills | Status: DC

## 2017-04-13 MED ORDER — BENZONATATE 200 MG PO CAPS: 200.0000 mg | ORAL_CAPSULE | Freq: Three times a day (TID) | ORAL | 0 refills | Status: DC | PRN

## 2017-04-13 NOTE — Patient Instructions (Signed)
[name],  Take augmentin twice daily for the next 10 days for presumed sinus infection.   Try tessalon perles and mucinex for cough.  If no improvement, please see [place] back or if you have worsening shortness of breath.  Best, Dr. Ola Spurr   Sinusitis, Adult Sinusitis is soreness and inflammation of your sinuses. Sinuses are hollow spaces in the bones around your face. They are located:  Around your eyes.  In the middle of your forehead.  Behind your nose.  In your cheekbones.  Your sinuses and nasal passages are lined with a stringy fluid (mucus). Mucus normally drains out of your sinuses. When your nasal tissues get inflamed or swollen, the mucus can get trapped or blocked so air cannot flow through your sinuses. This lets bacteria, viruses, and funguses grow, and that leads to infection. Follow these instructions at home: Medicines  Take, use, or apply over-the-counter and prescription medicines only as told by your doctor. These may include nasal sprays.  If you were prescribed an antibiotic medicine, take it as told by your doctor. Do not stop taking the antibiotic even if you start to feel better. Hydrate and Humidify  Drink enough water to keep your pee (urine) clear or pale yellow.  Use a cool mist humidifier to keep the humidity level in your home above 50%.  Breathe in steam for 10-15 minutes, 3-4 times a day or as told by your doctor. You can do this in the bathroom while a hot shower is running.  Try not to spend time in cool or dry air. Rest  Rest as much as possible.  Sleep with your head raised (elevated).  Make sure to get enough sleep each night. General instructions  Put a warm, moist washcloth on your face 3-4 times a day or as told by your doctor. This will help with discomfort.  Wash your hands often with soap and water. If there is no soap and water, use hand sanitizer.  Do not smoke. Avoid being around people who are smoking (secondhand  smoke).  Keep all follow-up visits as told by your doctor. This is important. Contact a doctor if:  You have a fever.  Your symptoms get worse.  Your symptoms do not get better within 10 days. Get help right away if:  You have a very bad headache.  You cannot stop throwing up (vomiting).  You have pain or swelling around your face or eyes.  You have trouble seeing.  You feel confused.  Your neck is stiff.  You have trouble breathing. This information is not intended to replace advice given to you by your health care provider. Make sure you discuss any questions you have with your health care provider. Document Released: 08/13/2007 Document Revised: 10/21/2015 Document Reviewed: 12/20/2014 Elsevier Interactive Patient Education  [employer].

## 2017-04-18 DIAGNOSIS — J329 Chronic sinusitis, unspecified: Secondary | ICD-10-CM | POA: Insufficient documentation

## 2017-04-18 NOTE — Assessment & Plan Note (Signed)
-   Given duration of symptoms, will treat with augmentin x 10 days. No focal findings on lung exam or fever to suggest pneumonia.  - Prescribed tessalon perles and recommended taking with mucinex to help with cough. - Counseled to drink plenty of fluids to help break up mucus - Return if worsening shortness of breath or no improvement

## 2017-04-18 NOTE — Progress Notes (Signed)
[facility] Progress Note  Subjective:  [name] is a 67 y.o. female with history of HTN, TIA, and migraines who presents for ongoing cough and congestion. She reports symptoms since the weekend prior (9 days). She has had headache, sore throat, nasal congestion, chest congestion and cough. She reports some left ear pain and a little shortness of breath. She has tried dayquil and nyquil without relief. Sick contact of her brother who was just admitted for pneumonia but has cancer so is immunocompromised. Does not think she has had fever. Eating and drinking okay. ROS: No n/v/d, no chest pain   Allergies  Allergen Reactions  . Shellfish Allergy Anaphylaxis and Other (See Comments)    All seafood  . Iodinated Diagnostic Agents   . Statins     Social History   Tobacco Use  . Smoking status: Former Smoker    Packs/day: 0.30    Types: Cigarettes    Last attempt to quit: 11/01/2011    Years since quitting: 5.4  . Smokeless tobacco: Never Used  Substance Use Topics  . Alcohol use: No    Alcohol/week: 0.0 oz    Objective: Blood pressure 140/86, pulse 88, temperature 99.1 F (37.3 C), temperature source Oral, weight 168 lb 3.2 oz (76.3 kg), SpO2 96 %. Body mass index is 26.34 kg/m. Constitutional: Pleasant female in NAD HENT: Nasal congestion present, minimal amount of fluid behind bilateral TMs, frontal sinus tenderness to percussion, mild erythema of posterior oropharynx Cardiovascular: RRR, S1, S2, no m/r/g.  Pulmonary/Chest: Effort normal and breath sounds normal.  Abdominal: Soft. +BS, NT Neurological: AOx3, no focal deficits. Skin: Skin is warm and dry. No rash noted.  Psychiatric: Normal mood and affect.  Vitals reviewed  Assessment/Plan: Sinus infection - Given duration of symptoms, will treat with augmentin x 10 days. No focal findings on lung exam or fever to suggest pneumonia.  - Prescribed tessalon perles and recommended taking with mucinex to  help with cough. - Counseled to drink plenty of fluids to help break up mucus - Return if worsening shortness of breath or no improvement  Follow-up prn.  Olene Floss, MD [place], PGY-3

## 2017-04-27 ENCOUNTER — Other Ambulatory Visit: Payer: Self-pay | Admitting: Adult Health

## 2017-04-27 DIAGNOSIS — D62 Acute posthemorrhagic anemia: Secondary | ICD-10-CM | POA: Diagnosis not present

## 2017-04-27 DIAGNOSIS — Z8619 Personal history of other infectious and parasitic diseases: Secondary | ICD-10-CM | POA: Diagnosis not present

## 2017-04-27 DIAGNOSIS — N281 Cyst of kidney, acquired: Secondary | ICD-10-CM | POA: Diagnosis not present

## 2017-04-27 DIAGNOSIS — N182 Chronic kidney disease, stage 2 (mild): Secondary | ICD-10-CM | POA: Diagnosis not present

## 2017-04-27 DIAGNOSIS — R7309 Other abnormal glucose: Secondary | ICD-10-CM | POA: Diagnosis not present

## 2017-04-27 DIAGNOSIS — G459 Transient cerebral ischemic attack, unspecified: Secondary | ICD-10-CM | POA: Diagnosis not present

## 2017-04-27 DIAGNOSIS — N179 Acute kidney failure, unspecified: Secondary | ICD-10-CM | POA: Diagnosis not present

## 2017-04-27 DIAGNOSIS — I129 Hypertensive chronic kidney disease with stage 1 through stage 4 chronic kidney disease, or unspecified chronic kidney disease: Secondary | ICD-10-CM | POA: Diagnosis not present

## 2017-04-27 DIAGNOSIS — F17201 Nicotine dependence, unspecified, in remission: Secondary | ICD-10-CM | POA: Diagnosis not present

## 2017-04-27 DIAGNOSIS — F329 Major depressive disorder, single episode, unspecified: Secondary | ICD-10-CM | POA: Diagnosis not present

## 2017-04-27 DIAGNOSIS — Z9049 Acquired absence of other specified parts of digestive tract: Secondary | ICD-10-CM | POA: Diagnosis not present

## 2017-04-27 DIAGNOSIS — R3129 Other microscopic hematuria: Secondary | ICD-10-CM | POA: Diagnosis not present

## 2017-04-29 ENCOUNTER — Telehealth: Payer: Self-pay | Admitting: Neurology

## 2017-04-29 NOTE — Telephone Encounter (Signed)
Blood work was done through [facility], blood was done on 27 April 2017.  Glucose of 117, BUN 19, creatinine 1.14, GFR of 51.  Sodium 141, potassium 4.3, chloride 108, CO2 24, calcium 9.7, total protein 6.5, albumin of 4.4, liver profile is unremarkable.  Phosphorus level of 3.6 which is normal.  White blood count of 5.7, hemoglobin 14.1, hematocrit 41.2, MCV of 85, platelets of 225.

## 2017-06-01 ENCOUNTER — Encounter: Payer: Self-pay | Admitting: Physical Medicine & Rehabilitation

## 2017-06-01 ENCOUNTER — Encounter: Payer: Medicare Other | Attending: Physical Medicine & Rehabilitation

## 2017-06-01 ENCOUNTER — Ambulatory Visit (HOSPITAL_BASED_OUTPATIENT_CLINIC_OR_DEPARTMENT_OTHER): Payer: Medicare Other | Admitting: Physical Medicine & Rehabilitation

## 2017-06-01 VITALS — BP 118/75 | HR 85

## 2017-06-01 DIAGNOSIS — Z79899 Other long term (current) drug therapy: Secondary | ICD-10-CM | POA: Diagnosis not present

## 2017-06-01 DIAGNOSIS — G588 Other specified mononeuropathies: Secondary | ICD-10-CM | POA: Insufficient documentation

## 2017-06-01 DIAGNOSIS — M546 Pain in thoracic spine: Secondary | ICD-10-CM | POA: Insufficient documentation

## 2017-06-01 MED ORDER — LIDOCAINE 5 % EX PTCH: 2.0000 | MEDICATED_PATCH | CUTANEOUS | 5 refills | Status: DC

## 2017-06-01 NOTE — Progress Notes (Signed)
Subjective:    Patient ID: [name], female    DOB: 05-19-1950, 67 y.o.   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 She currently uses Lidoderm patch 2 patches on 7 AM, off 7 PM. In addition, she uses gabapentin 600 mg 3 times a day. She has had no adverse reactions to the medications and they have been effective for her intercostal nerve pain  HPI Takes Gabapentin 600mg  TID prescribed by her neurologist.  She also takes Zonegran prescribed by her neurologist.   Patient is seen by her neurologist several times in the Lidoderm patch, 2 patches over the left mid ribs on 12 hours off 12 hours No new issues in the last year.  Patient's brother has been sick patient has had no new medical issues over the last several months. Pain Inventory Average Pain 9 Pain Right Now 9 My pain is sharp, stabbing and tingling  In the last 24 hours, has pain interfered with the following? General activity 7 Relation with others 7 Enjoyment of life 7 What TIME of day is your pain at its worst? evening Sleep (in general) Poor  Pain is worse with: bending and standing Pain improves with: heat/ice and medication Relief from Meds: 4  Mobility walk without assistance ability to climb steps?  yes do you drive?  yes  Function disabled: date disabled .  Neuro/Psych bladder control problems weakness spasms dizziness depression anxiety  Prior Studies Any changes since last visit?  no  Physicians involved in your care Any changes since last visit?  no   Family History  Problem Relation Age of Onset  . Cancer Mother 78       breast  . Alzheimer's disease Mother   . Cancer Brother   . Hyperlipidemia Brother   . Prostate cancer Father   . Lung cancer Father    Social History   Socioeconomic History  . Marital status: Married    Spouse name: [name]   . Number of children: 1  . Years of education: 12+  . Highest education level: Not on file  Occupational History  . Occupation: ACTIVITY DIRECTOR    Employer: [employer]  . Occupation: CNA  Social Needs  . Financial resource strain: Not on file  . Food insecurity:    Worry: Not on file    Inability: Not on file  . Transportation needs:    Medical: Not on file    Non-medical: Not on file  Tobacco Use  . Smoking status: Former Smoker    Packs/day: 0.30    Types: Cigarettes    Last attempt to quit: 11/01/2011    Years since quitting: 5.5  . Smokeless tobacco: Never Used  Substance and Sexual Activity  . Alcohol use: No    Alcohol/week: 0.0 oz  . Drug use: No  . Sexual activity: Yes    Partners: Male    Birth control/protection: Surgical    Comment: 1 sexual partner in last 12 months: married  Lifestyle  . Physical activity:    Days per week: Not on file    Minutes per session: Not on file  . Stress: Not on file  Relationships  . Social connections:    Talks on phone: Not on file    Gets together: Not on file    Attends religious service: Not on file    Active member of  club or organization: Not on file    Attends meetings of clubs or organizations: Not on file    Relationship status: Not on file  Other Topics Concern  . Not on file  Social History Narrative   Patient is married ([name]).   Patient has one child.   Patient is working full-time.   Patient is right handed.   Patient has a college education.   Patient does not drink caffeine.    Past Medical History:  Diagnosis Date  . Chronic kidney disease    stage 3  . GERD (gastroesophageal reflux disease)   . Hyperlipidemia   . Migraine headache   . Occipital neuralgia    Left  . Renal insufficiency   . Stroke ([place]) 10/2013   BP 118/75   Pulse 85   SpO2 97%   Opioid Risk Score:   Fall Risk Score:  `1  Depression screen PHQ 2/9  Depression screen [facility] 2/9 01/02/2017 11/04/2016 01/25/2016  07/13/2015 06/18/2015 05/21/2015 05/18/2015  Decreased Interest 0 0 0 0 0 0 1  Down, Depressed, Hopeless 0 0 1 0 1 0 1  PHQ - 2 Score 0 0 1 0 1 0 2  Altered sleeping - - - - - - 3  Tired, decreased energy - - - - - - 3  Change in appetite - - - - - - 0  Feeling bad or failure about yourself  - - - - - - 3  Trouble concentrating - - - - - - 3  Moving slowly or fidgety/restless - - - - - - 3  Suicidal thoughts - - - - - - 0  PHQ-9 Score - - - - - - 17  Difficult doing work/chores - - - - - - Somewhat difficult     Review of Systems  Constitutional: Positive for chills, diaphoresis and fever.  HENT: Negative.   Eyes: Negative.   Respiratory: Positive for shortness of breath.   Cardiovascular: Negative.   Gastrointestinal: Positive for diarrhea.  Endocrine: Negative.   Genitourinary: Positive for difficulty urinating.  Musculoskeletal: Positive for arthralgias and myalgias.  Skin: Negative.   Allergic/Immunologic: Negative.   Neurological: Positive for weakness.  Hematological: Bruises/bleeds easily.  Psychiatric/Behavioral: Positive for dysphoric mood. The patient is nervous/anxious.   All other systems reviewed and are negative.      Objective:   Physical Exam  Constitutional: She is oriented to person, place, and time. She appears well-developed and well-nourished. No distress.  HENT:  Head: Normocephalic and atraumatic.  Eyes: Pupils are equal, round, and reactive to light. Conjunctivae and EOM are normal.  Neck: Normal range of motion. Neck supple.  Musculoskeletal:  Tenderness over the left lateral ribs around T 78 at the mid axillary line There are no masses.  Neurological: She is alert and oriented to person, place, and time.  Skin: She is not diaphoretic.  Psychiatric: She has a normal mood and affect. Her behavior is normal. Thought content normal.  Nursing note and vitals reviewed.         Assessment & Plan:  1.  Intercostal neuralgia left T7-T8.  She remains  independent with all her self-care and mobility with her pain well managed using a combination of gabapentin and Lidoderm patch.  I have sent a message to her neurologist to see whether they can scrub Lidoderm patch for the intercostal neuralgia.  Otherwise I can see the patient back in 1 year

## 2017-06-09 ENCOUNTER — Other Ambulatory Visit: Payer: Self-pay | Admitting: *Deleted

## 2017-06-10 MED ORDER — ESOMEPRAZOLE MAGNESIUM 20 MG PO CPDR: 20.0000 mg | DELAYED_RELEASE_CAPSULE | Freq: Every day | ORAL | 5 refills | Status: DC

## 2017-06-19 DIAGNOSIS — M1711 Unilateral primary osteoarthritis, right knee: Secondary | ICD-10-CM | POA: Diagnosis not present

## 2017-06-19 DIAGNOSIS — M1712 Unilateral primary osteoarthritis, left knee: Secondary | ICD-10-CM | POA: Diagnosis not present

## 2017-06-25 DIAGNOSIS — M170 Bilateral primary osteoarthritis of knee: Secondary | ICD-10-CM | POA: Diagnosis not present

## 2017-07-01 ENCOUNTER — Encounter: Payer: Self-pay | Admitting: Adult Health

## 2017-07-01 ENCOUNTER — Ambulatory Visit (INDEPENDENT_AMBULATORY_CARE_PROVIDER_SITE_OTHER): Payer: Medicare Other | Admitting: Adult Health

## 2017-07-01 VITALS — BP 86/55 | HR 60 | Ht 67.0 in | Wt 172.2 lb

## 2017-07-01 DIAGNOSIS — G43019 Migraine without aura, intractable, without status migrainosus: Secondary | ICD-10-CM

## 2017-07-01 MED ORDER — TIZANIDINE HCL 2 MG PO TABS: 2.0000 mg | ORAL_TABLET | Freq: Three times a day (TID) | ORAL | 3 refills | Status: DC

## 2017-07-01 MED ORDER — ZONISAMIDE 100 MG PO CAPS: 100.0000 mg | ORAL_CAPSULE | Freq: Two times a day (BID) | ORAL | 11 refills | Status: DC

## 2017-07-01 MED ORDER — ZONISAMIDE 25 MG PO CAPS: ORAL_CAPSULE | ORAL | 11 refills | Status: DC

## 2017-07-01 NOTE — Progress Notes (Signed)
I have read the note, and I agree with the clinical assessment and plan.  Saniyyah Elster K Emin Foree   

## 2017-07-01 NOTE — Progress Notes (Signed)
PATIENT: [name] DOB: 08-30-1950  REASON FOR VISIT: follow up HISTORY FROM: patient  HISTORY OF PRESENT ILLNESS: Today 07/01/17 [name] is a 67 year old female with a history of intractable migraine headaches.  She returns today for follow-up.  She reports that she continues to have a daily dull headache.  She remains on Zonegran and tizanidine.  She states that she did receive a letter about Aimovig but was unable to follow-up on this due to her brother being in the hospital.  She states that she only has approximately one severe migraine a month.  Her headaches seem to occur on the right side starting at the neck radiating to the eyes.  She does have photophobia, phonophobia and nausea.  She continues to take doxepin at night.  She returns today for an evaluation.  HISTORY 12/31/16 [name] is a 67 year old female with a history of intractable migraine headaches. She returns today for follow-up. She is currently on Zonegran and tizanidine. She reports that she has a true migraine every 3 months. She continues to have a dull daily headache. Her headaches  occur on the right side. With her severe headaches she does have photophobia, phonophobia and nausea and vomiting. She states that she had a bad migraine Sunday and was in bed all day on Monday and she is just now starting to feel better. She does note that stress is a trigger for her migraines.Aimovig was ordered for the patient however she has not received this. She returns today for follow-up   REVIEW OF SYSTEMS: Out of a complete 14 system review of symptoms, the patient complains only of the following symptoms, and all other reviewed systems are negative.  Light sensitivity, double vision, blurred vision, murmur, restless leg, insomnia, frequent waking, muscle pain, neck pain, agitation, confusion, depression, nervous/anxious, weakness, numbness, headache, dizziness, memory loss  ALLERGIES: Allergies  Allergen Reactions    . Shellfish Allergy Anaphylaxis and Other (See Comments)    All seafood  . Iodinated Diagnostic Agents   . Statins     HOME MEDICATIONS: Outpatient Medications Prior to Visit  Medication Sig Dispense Refill  . clopidogrel (PLAVIX) 75 MG tablet TAKE 1 TABLET BY MOUTH DAILY 30 tablet 5  . doxepin (SINEQUAN) 100 MG capsule Take 2 capsules (200 mg total) by mouth at bedtime. 180 capsule 1  . esomeprazole (NEXIUM) 20 MG capsule Take 1 capsule (20 mg total) by mouth daily. 30 capsule 5  . furosemide (LASIX) 20 MG tablet Take 20 mg by mouth daily.   6  . gabapentin (NEURONTIN) 600 MG tablet TAKE 1 TABLET BY MOUTH TWICE DAILY 180 tablet 0  . lidocaine (LIDODERM) 5 % Place 2 patches onto the skin daily. Remove & Discard patch within 12 hours or as directed by MD 60 patch 5  . meclizine (ANTIVERT) 25 MG tablet Take 1 tablet (25 mg total) by mouth 2 (two) times daily as needed for dizziness. 20 tablet 0  . ramipril (ALTACE) 2.5 MG capsule Do not take this till you go back to the Clinic and they ask you to restart it. (Patient taking differently: Take 2.5 mg by mouth daily. Do not take this till you go back to the Clinic and they ask you to restart it.) 90 capsule 3  . rosuvastatin (CRESTOR) 10 MG tablet Take 1 tablet (10 mg total) by mouth daily. 90 tablet 0  . sertraline (ZOLOFT) 50 MG tablet Take 1 tablet (50 mg total) by mouth 2 (two) times  daily. 60 tablet 11  . tiZANidine (ZANAFLEX) 2 MG tablet Take 1 tablet (2 mg total) by mouth 3 (three) times daily. 270 tablet 1  . zonisamide (ZONEGRAN) 100 MG capsule TAKE 1 CAPSULE BY MOUTH TWICE A DAY 60 capsule 11  . zonisamide (ZONEGRAN) 25 MG capsule Take an extra 25 mg twice a day for 2 weeks, then take 50 mg twice a day for a total of 150 mg twice a day. 120 capsule 11   No facility-administered medications prior to visit.     PAST MEDICAL HISTORY: Past Medical History:  Diagnosis Date  . Chronic kidney disease    stage 3  . GERD  (gastroesophageal reflux disease)   . Hyperlipidemia   . Migraine headache   . Occipital neuralgia    Left  . Renal insufficiency   . Stroke [facility]) 10/2013    PAST SURGICAL HISTORY:   FAMILY HISTORY: Family History  Problem Relation Age of Onset  . Cancer Mother 67       breast  . Alzheimer's disease Mother   . Cancer Brother   . Hyperlipidemia Brother   . Prostate cancer Father   . Lung cancer Father     SOCIAL HISTORY: Social History   Socioeconomic History  . Marital status: Married    Spouse name: [name]  . Number of children: 1  . Years of education: 12+  . Highest education level: Not on file  Occupational History  . Occupation: ACTIVITY DIRECTOR    Employer: [employer]  . Occupation: CNA  Social Needs  . Financial resource strain: Not on file  . Food insecurity:    Worry: Not on file    Inability: Not on file  . Transportation needs:    Medical: Not on file    Non-medical: Not on file  Tobacco Use  . Smoking status: Former Smoker    Packs/day: 0.30    Types: Cigarettes    Last attempt to quit: 11/01/2011    Years since quitting: 5.6  . Smokeless tobacco: Never Used  Substance and Sexual Activity  . Alcohol use: No    Alcohol/week: 0.0 oz  . Drug use: No  . Sexual activity: Yes    Partners: Male    Birth control/protection: Surgical    Comment: 1 sexual partner in last 12 months: married  Lifestyle  . Physical activity:    Days per week: Not on file    Minutes per session: Not on file  . Stress: Not on file  Relationships  . Social connections:    Talks on phone: Not on file    Gets together: Not on file    Attends religious service: Not on file    Active member of club or organization: Not on file    Attends meetings of clubs or organizations: Not on file    Relationship status: Not on file  . Intimate partner violence:    Fear of current or ex partner: Not on file    Emotionally abused: Not on file    Physically abused: Not on  file    Forced sexual activity: Not on file  Other Topics Concern  . Not on file  Social History Narrative   Patient is married ([name]).   Patient has one child.   Patient is working full-time.   Patient is right handed.   Patient has a college education.   Patient does not drink caffeine.      PHYSICAL EXAM  Vitals:  07/01/17 0715  BP: (!) 86/55  Pulse: 60  Weight: 172 lb 3.2 oz (78.1 kg)  Height: 5\' 7"  (1.702 m)   Body mass index is 26.97 kg/m.  Generalized: Well developed, in no acute distress   Neurological examination  Mentation: Alert oriented to time, place, history taking. Follows all commands speech and language fluent Cranial nerve II-XII: Pupils were equal round reactive to light. Extraocular movements were full, visual field were full on confrontational test. Facial sensation and strength were normal. Uvula tongue midline. Head turning and shoulder shrug  were normal and symmetric. Motor: The motor testing reveals 5 over 5 strength of all 4 extremities. Good symmetric motor tone is noted throughout.  Sensory: Sensory testing is intact to soft touch on all 4 extremities. No evidence of extinction is noted.  Coordination: Cerebellar testing reveals good finger-nose-finger and heel-to-shin bilaterally.  Gait and station: Gait is normal. Tandem gait is normal. Romberg is negative. No drift is seen.  Reflexes: Deep tendon reflexes are symmetric and normal bilaterally.   DIAGNOSTIC DATA (LABS, IMAGING, TESTING) - I reviewed patient records, labs, notes, testing and imaging myself where available.  Lab Results  Component Value Date   WBC 5.9 11/04/2016   HGB 14.0 11/04/2016   HCT 42.2 11/04/2016   MCV 88 11/04/2016   PLT 235 11/04/2016      Component Value Date/Time   NA 141 02/25/2016 0907   K 4.3 02/25/2016 0907   CL 108 02/25/2016 0907   CO2 23 02/25/2016 0907   GLUCOSE 99 02/25/2016 0907   BUN 16 02/25/2016 0907   CREATININE 1.10 (H) 02/25/2016 0907    CALCIUM 9.0 02/25/2016 0907   PROT 5.3 (L) 01/10/2016 1600   ALBUMIN 2.2 (L) 01/10/2016 1600   AST 26 01/10/2016 1600   ALT 34 01/10/2016 1600   ALKPHOS 160 (H) 01/10/2016 1600   BILITOT 0.4 01/10/2016 1600   GFRNONAA 53 (L) 02/25/2016 0907   GFRAA 61 02/25/2016 0907   Lab Results  Component Value Date   CHOL 255 (H) 11/05/2016   HDL 55 11/05/2016   LDLCALC 174 (H) 11/05/2016   LDLDIRECT 156 (H) 03/27/2017   TRIG 129 11/05/2016   CHOLHDL 4.6 (H) 11/05/2016   Lab Results  Component Value Date   HGBA1C 5.9 (H) 10/10/2013   No results found for: VITAMINB12 Lab Results  Component Value Date   TSH 1.402 12/25/2014      ASSESSMENT AND PLAN 67 y.o. year old female  has a past medical history of Chronic kidney disease, GERD (gastroesophageal reflux disease), Hyperlipidemia, Migraine headache, Occipital neuralgia, Renal insufficiency, and Stroke ([facility]) (10/2013). here with:  1.  Intractable migraine headaches  The patient continues to have daily headaches.  She will continue on Zonegran and tizanidine. I have encouraged the patient to follow-up on Aimovig.  According to the letter she needs to print access card and she will be able to obtain the medication.  Patient voiced understanding.  She will follow-up in 6 months or sooner if needed.      Ward Givens, MSN, NP-C 07/01/2017, 7:27 AM [facility] [address], [place],  [address] [phone]  I spent 15 minutes with the patient. 50% of this time was spent discussing AImovig

## 2017-07-01 NOTE — Patient Instructions (Signed)
Your Plan:  Continue Zonegran and Tizandine Get access card for Aimovig If your symptoms worsen or you develop new symptoms please let us know.   Thank you for coming to see [place] at [facility]. I hope we have been able to provide you high quality care today.  You may receive a patient satisfaction survey over the next few weeks. We would appreciate your feedback and comments so that we may continue to improve ourselves and the health of our patients.

## 2017-07-03 DIAGNOSIS — M1712 Unilateral primary osteoarthritis, left knee: Secondary | ICD-10-CM | POA: Diagnosis not present

## 2017-07-03 DIAGNOSIS — M170 Bilateral primary osteoarthritis of knee: Secondary | ICD-10-CM | POA: Diagnosis not present

## 2017-07-03 DIAGNOSIS — M1711 Unilateral primary osteoarthritis, right knee: Secondary | ICD-10-CM | POA: Diagnosis not present

## 2017-07-13 ENCOUNTER — Other Ambulatory Visit: Payer: Self-pay | Admitting: *Deleted

## 2017-07-13 MED ORDER — ESOMEPRAZOLE MAGNESIUM 20 MG PO CPDR: 20.0000 mg | DELAYED_RELEASE_CAPSULE | Freq: Every day | ORAL | 5 refills | Status: DC

## 2017-07-21 ENCOUNTER — Ambulatory Visit (INDEPENDENT_AMBULATORY_CARE_PROVIDER_SITE_OTHER): Payer: Medicare Other | Admitting: Family Medicine

## 2017-07-21 ENCOUNTER — Telehealth: Payer: Self-pay

## 2017-07-21 ENCOUNTER — Other Ambulatory Visit: Payer: Self-pay | Admitting: Family Medicine

## 2017-07-21 ENCOUNTER — Telehealth: Payer: Self-pay | Admitting: Family Medicine

## 2017-07-21 ENCOUNTER — Encounter: Payer: Self-pay | Admitting: Family Medicine

## 2017-07-21 VITALS — BP 90/54 | HR 68 | Temp 98.4°F | Ht 67.0 in | Wt 174.2 lb

## 2017-07-21 DIAGNOSIS — R0602 Shortness of breath: Secondary | ICD-10-CM | POA: Diagnosis not present

## 2017-07-21 DIAGNOSIS — M7989 Other specified soft tissue disorders: Secondary | ICD-10-CM | POA: Diagnosis not present

## 2017-07-21 DIAGNOSIS — R202 Paresthesia of skin: Secondary | ICD-10-CM

## 2017-07-21 NOTE — Telephone Encounter (Signed)
Called and informed patient that Echocardiogram is scheduled on Friday 07/24/2017 at 8 am with an arrival time of 745 am. She is to go straight to admissions and register for radiology. Patient had no problem with date or time of appointment.Ozella Almond, CMA

## 2017-07-21 NOTE — Patient Instructions (Addendum)
It was nice seeing you again today!  You were seen in clinic for leg swelling and arm numbness.  As we discussed, the leg swelling may be due to several underlying reasons and I am ordering blood-work to evaluate this.  In the meantime, if you experience any shortness of breath, chest pain, palpitations I would like for you to be seen in the ER.  I have ordered a ultrasound of your right leg to make sure there is no clot.  The nurse will give you the appointment time and date for this.  For your right arm numbness and tingling, it is possible this is due to your medication.  However, it is also possible that this can be due to a vitamin deficiency or thyroid issue.  We have also ordered blood work for those and I will get back to you once I have the results of your labs.  I would like for you to take your Crestor every other day instead of every day.  This may help reduce some of the muscle aches you are experiencing.  Please follow-up in about a month or sooner if needed.   Be well, Lovenia Kim, MD

## 2017-07-21 NOTE — Progress Notes (Addendum)
Subjective:   Patient ID: [name]    DOB: March 20, 1950, 67 y.o. female   MRN: [id number]  CC: R leg swelling, muscle cramps  HPI: [name] is a 67 y.o. female who presents to clinic today for the following issues.   R leg swelling Patient reports R lower leg pain and swelling x several days.  Also with worsening dyspnea on exertion over last several weeks.  She has been taking lasix 40 mg daily for leg swelling however this has not helped.  No prior h/o DVT or PE.  She does have h/o stroke August 2015. She is a former smoker, quit 5 years ago.  No known history of CHF.  She denies CP, palpitations.  Endorses 2 pillow orthopnea and laying flat is sometimes difficult for her.  No home O2 requirement.  She denies cough fever, chills.    Muscle cramps Getting progressively worse ever since restarting statin medication.  She feels this mostly in her thighs bilaterally.  Last  lipid panel 10/2016.    ROS: No fever, chills, nausea, vomiting.  No abdominal pain.  +leg swelling   Social: pt is a former smoker  Medications reviewed. Objective:   BP (!) 90/54 (BP Location: Left Arm, Patient Position: Sitting, Cuff Size: Normal)   Pulse 68   Temp 98.4 F (36.9 C) (Oral)   Ht 5\' 7"  (1.702 m)   Wt 174 lb 3.2 oz (79 kg)   SpO2 96%   BMI 27.28 kg/m  Vitals and nursing note reviewed.  General: pleasant 67 yo F, NAD  HEENT: NCAT, EOMI, PERRL  Neck: supple, no JVD  CV: RRR no MRG , 2+ pedal pulses  Lungs: CTAB, no crackles  Skin: warm, dry, no rashes R leg: no edema noted, negative Homan's sign, tenderness noted with palpation of calf, mild erythema, L leg normal in appearance  Extremities: warm and well perfused, normal tone  Assessment & Plan:   Leg swelling No current SOB and vitals within normal so low suspicion for PE.  Lungs clear on exam.  Possible this may be new CHF however unilateral calf tenderness and swelling would be abnormal for this. Patient was advised to go  to ED for right lower extremity Doppler to rule out a clot.  She stated that she would not prefer to go to the ER due to long wait times.  I am adding d-dimer to labs for further workup.  Was unable to schedule outpatient doppler study today for her.  Pt expressed she would like to await bloodwork.   -- BMP, CBC, BNP, d-dimer -- obtain ECHO  -We will hold off on scheduling an outpatient ultrasound for now and will await d-dimer results -Strict return precautions discussed with patient, she expressed good understanding  Orders Placed This Encounter  Procedures  . US Venous Img Lower Unilateral Right    Standing Status:   Future    Standing Expiration Date:   09/21/2018    Order Specific Question:   Reason for Exam (SYMPTOM  OR DIAGNOSIS REQUIRED)    Answer:   calf swelling    Order Specific Question:   Preferred imaging location?    Answer:   [facility]  . CBC  . Basic Metabolic Panel  . Vitamin B12  . TSH  . D-dimer, quantitative (not at [facility])  . ECHOCARDIOGRAM COMPLETE    Standing Status:   Future    Number of Occurrences:   1    Standing Expiration  Date:   10/22/2018    Order Specific Question:   Where should this test be performed    Answer:   [facility]    Order Specific Question:   Perflutren DEFINITY (image enhancing agent) should be administered unless hypersensitivity or allergy exist    Answer:   Administer Perflutren    Order Specific Question:   Expected Date:    Answer:   1 week   Lovenia Kim, MD [place], PGY-2 07/28/2017 6:58 PM

## 2017-07-21 NOTE — Telephone Encounter (Signed)
**  After Hours/ Emergency Line Call*  Received a call from [place] to report that [employer] had elevated d dimer to 0.8. Called patient on listed mobile number, no answer, generic VM without identifying information. Did not leave voicemail. Will forward to PCP.  Ralene Ok, MD PGY-2, [facility]

## 2017-07-22 LAB — BASIC METABOLIC PANEL
BUN/Creatinine Ratio: 15 (ref 12–28)
BUN: 20 mg/dL (ref 8–27)
CO2: 24 mmol/L (ref 20–29)
Calcium: 9.4 mg/dL (ref 8.7–10.3)
Chloride: 102 mmol/L (ref 96–106)
Creatinine, Ser: 1.36 mg/dL — ABNORMAL HIGH (ref 0.57–1.00)
GFR calc Af Amer: 47 mL/min/{1.73_m2} — ABNORMAL LOW (ref >59)
GFR calc non Af Amer: 41 mL/min/{1.73_m2} — ABNORMAL LOW (ref >59)
Glucose: 93 mg/dL (ref 65–99)
Potassium: 4.6 mmol/L (ref 3.5–5.2)
Sodium: 143 mmol/L (ref 134–144)

## 2017-07-22 LAB — CBC
Hematocrit: 40.6 % (ref 34.0–46.6)
Hemoglobin: 13.2 g/dL (ref 11.1–15.9)
MCH: 28.6 pg (ref 26.6–33.0)
MCHC: 32.5 g/dL (ref 31.5–35.7)
MCV: 88 fL (ref 79–97)
Platelets: 237 10*3/uL (ref 150–379)
RBC: 4.62 x10E6/uL (ref 3.77–5.28)
RDW: 13.6 % (ref 12.3–15.4)
WBC: 5.7 10*3/uL (ref 3.4–10.8)

## 2017-07-22 LAB — VITAMIN B12: Vitamin B-12: 290 pg/mL (ref 232–1245)

## 2017-07-22 LAB — TSH: TSH: 0.97 u[IU]/mL (ref 0.450–4.500)

## 2017-07-22 LAB — D-DIMER, QUANTITATIVE (NOT AT ARMC): D-DIMER: 0.89 mg/L FEU — ABNORMAL HIGH (ref 0.00–0.49)

## 2017-07-22 NOTE — Telephone Encounter (Signed)
Pt left message returning call- call back number [phone] Wallace Cullens, RN

## 2017-07-22 NOTE — Telephone Encounter (Signed)
Attempted to call patient to inform her of the result and advise following up with doppler of R leg to rule out a clot.  Unable to leave VM as phone did not route to messaging system.  Will try again in the morning. If she calls clinic phone in the morning please let her know the above.

## 2017-07-24 ENCOUNTER — Telehealth: Payer: Self-pay | Admitting: Family Medicine

## 2017-07-24 ENCOUNTER — Ambulatory Visit (HOSPITAL_COMMUNITY)
Admission: RE | Admit: 2017-07-24 | Discharge: 2017-07-24 | Disposition: A | Discharge status: Home / Self Care | Payer: Medicare Other | Source: Ambulatory Visit | Attending: Family Medicine | Admitting: Family Medicine

## 2017-07-24 DIAGNOSIS — R0602 Shortness of breath: Secondary | ICD-10-CM | POA: Diagnosis not present

## 2017-07-24 DIAGNOSIS — I10 Essential (primary) hypertension: Secondary | ICD-10-CM | POA: Insufficient documentation

## 2017-07-24 DIAGNOSIS — Z8673 Personal history of transient ischemic attack (TIA), and cerebral infarction without residual deficits: Secondary | ICD-10-CM | POA: Diagnosis not present

## 2017-07-24 DIAGNOSIS — Z87891 Personal history of nicotine dependence: Secondary | ICD-10-CM | POA: Diagnosis not present

## 2017-07-24 DIAGNOSIS — E785 Hyperlipidemia, unspecified: Secondary | ICD-10-CM | POA: Diagnosis not present

## 2017-07-24 NOTE — Progress Notes (Signed)
  Echocardiogram 2D Echocardiogram has been performed.  [name] 07/24/2017, 8:39 AM

## 2017-07-24 NOTE — Telephone Encounter (Signed)
Was able to get through on patient's phone number this afternoon and informed her of elevated d-dimer.  I advised her to go to ED for dopplers and she expressed good understanding.  She was appreciative of the call.   Lovenia Kim, MD

## 2017-07-25 ENCOUNTER — Other Ambulatory Visit: Payer: Self-pay | Admitting: Neurology

## 2017-07-26 ENCOUNTER — Observation Stay (HOSPITAL_COMMUNITY)
Admission: EM | Admit: 2017-07-26 | Discharge: 2017-07-28 | Disposition: A | Discharge status: Home / Self Care | Payer: Medicare Other | Attending: Family Medicine | Admitting: Family Medicine

## 2017-07-26 ENCOUNTER — Emergency Department (HOSPITAL_COMMUNITY): Payer: Medicare Other

## 2017-07-26 ENCOUNTER — Emergency Department (HOSPITAL_BASED_OUTPATIENT_CLINIC_OR_DEPARTMENT_OTHER): Payer: Medicare Other

## 2017-07-26 ENCOUNTER — Encounter (HOSPITAL_COMMUNITY): Payer: Self-pay | Admitting: Emergency Medicine

## 2017-07-26 ENCOUNTER — Other Ambulatory Visit: Payer: Self-pay

## 2017-07-26 DIAGNOSIS — F329 Major depressive disorder, single episode, unspecified: Secondary | ICD-10-CM | POA: Insufficient documentation

## 2017-07-26 DIAGNOSIS — M79609 Pain in unspecified limb: Secondary | ICD-10-CM

## 2017-07-26 DIAGNOSIS — Z79899 Other long term (current) drug therapy: Secondary | ICD-10-CM | POA: Diagnosis not present

## 2017-07-26 DIAGNOSIS — R0602 Shortness of breath: Secondary | ICD-10-CM | POA: Diagnosis not present

## 2017-07-26 DIAGNOSIS — E785 Hyperlipidemia, unspecified: Secondary | ICD-10-CM | POA: Insufficient documentation

## 2017-07-26 DIAGNOSIS — N183 Chronic kidney disease, stage 3 (moderate): Secondary | ICD-10-CM | POA: Insufficient documentation

## 2017-07-26 DIAGNOSIS — M792 Neuralgia and neuritis, unspecified: Secondary | ICD-10-CM | POA: Insufficient documentation

## 2017-07-26 DIAGNOSIS — Z66 Do not resuscitate: Secondary | ICD-10-CM | POA: Insufficient documentation

## 2017-07-26 DIAGNOSIS — Z7902 Long term (current) use of antithrombotics/antiplatelets: Secondary | ICD-10-CM | POA: Insufficient documentation

## 2017-07-26 DIAGNOSIS — K219 Gastro-esophageal reflux disease without esophagitis: Secondary | ICD-10-CM | POA: Insufficient documentation

## 2017-07-26 DIAGNOSIS — I129 Hypertensive chronic kidney disease with stage 1 through stage 4 chronic kidney disease, or unspecified chronic kidney disease: Secondary | ICD-10-CM | POA: Diagnosis not present

## 2017-07-26 DIAGNOSIS — G43909 Migraine, unspecified, not intractable, without status migrainosus: Secondary | ICD-10-CM | POA: Diagnosis not present

## 2017-07-26 DIAGNOSIS — I2510 Atherosclerotic heart disease of native coronary artery without angina pectoris: Secondary | ICD-10-CM | POA: Diagnosis not present

## 2017-07-26 DIAGNOSIS — I2584 Coronary atherosclerosis due to calcified coronary lesion: Secondary | ICD-10-CM

## 2017-07-26 DIAGNOSIS — R0600 Dyspnea, unspecified: Secondary | ICD-10-CM | POA: Diagnosis present

## 2017-07-26 DIAGNOSIS — Z87891 Personal history of nicotine dependence: Secondary | ICD-10-CM | POA: Diagnosis not present

## 2017-07-26 DIAGNOSIS — R300 Dysuria: Secondary | ICD-10-CM | POA: Diagnosis not present

## 2017-07-26 DIAGNOSIS — R7989 Other specified abnormal findings of blood chemistry: Secondary | ICD-10-CM

## 2017-07-26 DIAGNOSIS — I2699 Other pulmonary embolism without acute cor pulmonale: Secondary | ICD-10-CM | POA: Diagnosis not present

## 2017-07-26 DIAGNOSIS — Z8673 Personal history of transient ischemic attack (TIA), and cerebral infarction without residual deficits: Secondary | ICD-10-CM | POA: Insufficient documentation

## 2017-07-26 DIAGNOSIS — R791 Abnormal coagulation profile: Secondary | ICD-10-CM | POA: Diagnosis not present

## 2017-07-26 DIAGNOSIS — M79604 Pain in right leg: Secondary | ICD-10-CM | POA: Diagnosis not present

## 2017-07-26 LAB — CBC WITH DIFFERENTIAL/PLATELET
Abs Immature Granulocytes: 0 10*3/uL (ref 0.0–0.1)
Basophils Absolute: 0.1 10*3/uL (ref 0.0–0.1)
Basophils Relative: 1 %
Eosinophils Absolute: 0.1 10*3/uL (ref 0.0–0.7)
Eosinophils Relative: 2 %
HCT: 44.5 % (ref 36.0–46.0)
Hemoglobin: 14.3 g/dL (ref 12.0–15.0)
Immature Granulocytes: 0 %
Lymphocytes Relative: 24 %
Lymphs Abs: 1.2 10*3/uL (ref 0.7–4.0)
MCH: 28.4 pg (ref 26.0–34.0)
MCHC: 32.1 g/dL (ref 30.0–36.0)
MCV: 88.3 fL (ref 78.0–100.0)
Monocytes Absolute: 0.3 10*3/uL (ref 0.1–1.0)
Monocytes Relative: 7 %
Neutro Abs: 3.3 10*3/uL (ref 1.7–7.7)
Neutrophils Relative %: 66 %
Platelets: 213 10*3/uL (ref 150–400)
RBC: 5.04 MIL/uL (ref 3.87–5.11)
RDW: 13.1 % (ref 11.5–15.5)
WBC: 5 10*3/uL (ref 4.0–10.5)

## 2017-07-26 LAB — I-STAT TROPONIN, ED: Troponin i, poc: 0.01 ng/mL (ref 0.00–0.08)

## 2017-07-26 LAB — COMPREHENSIVE METABOLIC PANEL
ALT: 21 U/L (ref 14–54)
AST: 24 U/L (ref 15–41)
Albumin: 4.2 g/dL (ref 3.5–5.0)
Alkaline Phosphatase: 101 U/L (ref 38–126)
Anion gap: 9 (ref 5–15)
BUN: 19 mg/dL (ref 6–20)
CO2: 24 mmol/L (ref 22–32)
Calcium: 9 mg/dL (ref 8.9–10.3)
Chloride: 107 mmol/L (ref 101–111)
Creatinine, Ser: 1.19 mg/dL — ABNORMAL HIGH (ref 0.44–1.00)
GFR calc Af Amer: 54 mL/min — ABNORMAL LOW (ref >60)
GFR calc non Af Amer: 47 mL/min — ABNORMAL LOW (ref >60)
Glucose, Bld: 112 mg/dL — ABNORMAL HIGH (ref 65–99)
Potassium: 4.1 mmol/L (ref 3.5–5.1)
Sodium: 140 mmol/L (ref 135–145)
Total Bilirubin: 0.6 mg/dL (ref 0.3–1.2)
Total Protein: 7 g/dL (ref 6.5–8.1)

## 2017-07-26 LAB — APTT: aPTT: 33 seconds (ref 24–36)

## 2017-07-26 LAB — PROTIME-INR
INR: 0.91
Prothrombin Time: 12.2 seconds (ref 11.4–15.2)

## 2017-07-26 LAB — TROPONIN I
Troponin I: <0.03 ng/mL (ref <0.03)
Troponin I: <0.03 ng/mL (ref <0.03)

## 2017-07-26 MED ORDER — ROSUVASTATIN CALCIUM 10 MG PO TABS
10.0000 mg | ORAL_TABLET | Freq: Every day | ORAL | Status: DC
  Administered 2017-07-26 – 2017-07-28 (×3): 10 mg via ORAL
  Filled 2017-07-26 (×3): qty 1

## 2017-07-26 MED ORDER — RAMIPRIL 2.5 MG PO CAPS
2.5000 mg | ORAL_CAPSULE | Freq: Every day | ORAL | Status: DC
  Administered 2017-07-26 – 2017-07-28 (×3): 2.5 mg via ORAL
  Filled 2017-07-26 (×4): qty 1

## 2017-07-26 MED ORDER — TIZANIDINE HCL 2 MG PO TABS
2.0000 mg | ORAL_TABLET | Freq: Three times a day (TID) | ORAL | Status: DC
  Administered 2017-07-26 – 2017-07-28 (×6): 2 mg via ORAL
  Filled 2017-07-26 (×6): qty 1

## 2017-07-26 MED ORDER — ACETAMINOPHEN 650 MG RE SUPP: 650.0000 mg | Freq: Four times a day (QID) | RECTAL | Status: DC | PRN

## 2017-07-26 MED ORDER — ACETAMINOPHEN 325 MG PO TABS: 650.0000 mg | ORAL_TABLET | Freq: Four times a day (QID) | ORAL | Status: DC | PRN

## 2017-07-26 MED ORDER — DOXEPIN HCL 100 MG PO CAPS
200.0000 mg | ORAL_CAPSULE | Freq: Every day | ORAL | Status: DC
  Administered 2017-07-26 – 2017-07-27 (×2): 200 mg via ORAL
  Filled 2017-07-26 (×2): qty 2

## 2017-07-26 MED ORDER — GABAPENTIN 600 MG PO TABS
600.0000 mg | ORAL_TABLET | Freq: Two times a day (BID) | ORAL | Status: DC
  Administered 2017-07-26 – 2017-07-28 (×4): 600 mg via ORAL
  Filled 2017-07-26 (×4): qty 1

## 2017-07-26 MED ORDER — CLOPIDOGREL BISULFATE 75 MG PO TABS
75.0000 mg | ORAL_TABLET | Freq: Every day | ORAL | Status: DC
Start: 2017-07-26 — End: 2017-07-28
  Administered 2017-07-26 – 2017-07-28 (×3): 75 mg via ORAL
  Filled 2017-07-26 (×3): qty 1

## 2017-07-26 MED ORDER — SERTRALINE HCL 50 MG PO TABS
50.0000 mg | ORAL_TABLET | Freq: Two times a day (BID) | ORAL | Status: DC
  Administered 2017-07-26 – 2017-07-28 (×4): 50 mg via ORAL
  Filled 2017-07-26 (×4): qty 1

## 2017-07-26 MED ORDER — FUROSEMIDE 40 MG PO TABS
40.0000 mg | ORAL_TABLET | Freq: Every day | ORAL | Status: DC
  Administered 2017-07-26 – 2017-07-28 (×3): 40 mg via ORAL
  Filled 2017-07-26 (×3): qty 1

## 2017-07-26 MED ORDER — ENOXAPARIN SODIUM 40 MG/0.4ML ~~LOC~~ SOLN
40.0000 mg | SUBCUTANEOUS | Status: DC
  Administered 2017-07-26: 40 mg via SUBCUTANEOUS
  Filled 2017-07-26: qty 0.4

## 2017-07-26 MED ORDER — ZONISAMIDE 25 MG PO CAPS
150.0000 mg | ORAL_CAPSULE | Freq: Two times a day (BID) | ORAL | Status: DC
  Administered 2017-07-26 – 2017-07-28 (×4): 150 mg via ORAL
  Filled 2017-07-26 (×5): qty 6

## 2017-07-26 MED ORDER — ZONISAMIDE 100 MG PO CAPS: 100.0000 mg | ORAL_CAPSULE | Freq: Two times a day (BID) | ORAL | Status: DC

## 2017-07-26 NOTE — ED Notes (Signed)
Patient transported to vascular US

## 2017-07-26 NOTE — H&P (Addendum)
[facility] Admission History and Physical Service Pager: [phone]  Patient name: [name] Medical record number: [id number] Date of birth: Mar 31, 1950 Age: 67 y.o. Gender: female  Primary Care Provider: Lovenia Kim, MD Consultants: none Code Status: DNR  Chief Complaint: SOB and RLE swelling  Assessment and Plan: [name] is a 67 y.o. female presenting with SOB and RLE swelling. PMH is significant for HTN, HLD, migraines, h/o CVA, GERD, CKD stage 3.  Dyspnea on Exertion  Chest tightness  LE swelling: Patient with worsening dyspnea on exertion and RLE swelling for 3 weeks. Seen in office on 5/14 and D-dimer obtained at that time as patient did not want to go to the ED for doppler. D-Dimer elevated to 0.89 and upon arrival to ED today, RLE doppler found no DVT. However, given dyspnea at same time a VQ scan to be obtained to rule out PE, as patient is allergic to dye contraindicating CTA. Well's score is 0 today on admission.  If VQ scan does not suggest PE will likely do no further work-up.  CXR with no acute cardiopulmonary process. Patient is also describing chest tightness with worsening dyspnea.  EKG with no signs of MI but poor quality.  Will repeat a.m. EKG.  Trend troponins.  I-STAT troponin ED negative.  Patient denies any history of PE or DVT.  Patient had ECHO on 07/24/2017 which showed EF of 55 to 60% with G1 DD. -Admit to [name], attending Dr. Ardelia [name] -Trend troponins -A.m. EKG -Consider cardiology consult if indicated -Vitals per unit -Follow-up on VQ scan  CKD, stage 3: Cr 1.19 and GFR 47. Unclear of patient's baseline.  Patient followed by nephrology outpatient. -Monitor on daily BMP -Avoid nephrotoxic agents  H/o TIA in 2015: Patient on Plavix and Crestor daily.  Patient with prior history of tobacco use. -Continue Plavix and Crestor  HTN: Patient on Lasix 40 mg daily and ramipril 2.5 mg. -Continue home  medications -Monitor BP daily  HLD: Patient on Crestor daily. -Continue home medications  Migraines: patient reports taking meclizine for migraines, as well as doxepin, zonegran and tizanidine. She is followed by [facility] neurology and saw them last on 07/01/2017 -holding home meclizine at this time and will clarify -continue doxepin, zonegran and tizanidine  Depression: On zoloft 50mg  BID. -continue home zoloft, ask about BID dosing  Dysuria: Patient reports that this is new. No fevers.  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 She currently uses Lidoderm patch, and she uses gabapentin 600 mg 3 times a day -continue gabapentin and supply lidocaine patch if requested  FEN/GI: Heart healthy diet Prophylaxis: Lovenox  Disposition: admit for observation  History of Present Illness:  [name] is a 67 y.o. female presenting with 3 weeks of worsening shortness of breath and right lower extremity swelling. Patient went into family medicine center and they drew lab work which was elevated (D-dimer). She was told to come into the ED on 5/17 for doppler to rule out DVT, however she called and was told the ED was very busy so she waited to come in until today.   She reports of 2 pillow orthopnea (usually sleeps on one. Reports of intermittent chest pressure centrally since she had her stroke in 2015. The chest pressure symptoms have been stable and not worsening or increasing in frequency. Denies long travels, being bed bound, and not taking estrogen. Currently not up to date on cancer screenings.    Review  Of Systems: Per HPI with the following additions:   Review of Systems  Constitutional: Negative for chills and fever.  Respiratory: Positive for shortness of breath.   Cardiovascular: Positive for chest pain and leg swelling.  Gastrointestinal: Positive for diarrhea. Negative for abdominal pain, constipation, nausea and vomiting.  Genitourinary: Positive  for dysuria.  Neurological: Positive for headaches.    Patient Active Problem List   Diagnosis Date Noted  . Sinus infection 04/18/2017  . Right ear pain 01/02/2017  . Easy bruising 11/10/2016  . Hospital discharge follow-up 01/25/2016  . Pneumonia 01/14/2016  . SOB (shortness of breath)   . Difficulty in walking, not elsewhere classified   . Diarrhea of presumed infectious origin   . LFT elevation   . Hypokalemia   . Enteritis due to Clostridium difficile   . Pleural effusion on right   . Bilateral pleural effusion   . Biloma   . Biloma following surgery   . Status post cholecystectomy   . Infection by Streptococcus, viridans group   . Bile leak, postoperative   . Sepsis ([name]) 12/11/2015  . Symptomatic cholelithiasis 12/05/2015  . HLD (hyperlipidemia) 04/02/2015  . Chronic migraine without aura, with status migrainosus 09/26/2014  . Dizziness 11/09/2013  . Adjustment disorder with mixed anxiety and depressed mood 11/03/2013  . Migraine variant 10/10/2013  . Disturbance of skin sensation 10/10/2013  . TIA (transient ischemic attack) 10/10/2013  . HTN (hypertension) 07/18/2013  . 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 Neuralgia, neuritis, and radiculitis, unspecified 02/04/2012  . 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 Myofascial muscle pain 07/15/2011    Past Medical History: Past Medical History:  Diagnosis Date  . Chronic kidney disease    stage 3  . GERD (gastroesophageal reflux disease)   . Hyperlipidemia   . Migraine headache   . Occipital neuralgia    Left  . Renal insufficiency   . Stroke [facility]) 10/2013    Past Surgical History: Past Surgical History:  Procedure Laterality Date  . ABDOMINAL HYSTERECTOMY    . BILIARY STENT PLACEMENT N/A 12/12/2015   Procedure: BILIARY STENT PLACEMENT;  Surgeon: Doran Stabler, MD;  Location: [facility] ENDOSCOPY;  Service: Endoscopy;  Laterality: N/A;  . BLADDER REPAIR    . CARPAL TUNNEL RELEASE    . CERVICAL SPINE  SURGERY    . CHOLECYSTECTOMY N/A 12/06/2015   Procedure: LAPAROSCOPIC CHOLECYSTECTOMY WITH  INTRAOPERATIVE CHOLANGIOGRAM;  Surgeon: Stark Klein, MD;  Location: [facility];  Service: General;  Laterality: N/A;  . ELBOW SURGERY    . ERCP N/A 12/12/2015   Procedure: ENDOSCOPIC RETROGRADE CHOLANGIOPANCREATOGRAPHY (ERCP);  Surgeon: Doran Stabler, MD;  Location: [facility] ENDOSCOPY;  Service: Endoscopy;  Laterality: N/A;  . ESOPHAGOGASTRODUODENOSCOPY N/A 01/28/2016   Procedure: ESOPHAGOGASTRODUODENOSCOPY (EGD) **Biliary STENT removal**;  Surgeon: Doran Stabler, MD;  Location: [facility] ENDOSCOPY;  Service: Gastroenterology;  Laterality: N/A;  . IR GENERIC HISTORICAL  12/17/2015   IR US GUIDE BX ASP/DRAIN 12/17/2015 [facility]INTER[facility] RAD  . IR GENERIC HISTORICAL  12/17/2015   IR GUIDED DRAIN W CATHETER PLACEMENT 12/17/2015 [facility]  . IR GENERIC HISTORICAL  12/17/2015   IR SINUS/FIST TUBE CHK-NON GI 12/17/2015 [facility]INTER[facility] RAD  . KNEE SURGERY    . LUNG SURGERY    . TEE WITHOUT CARDIOVERSION N/A 12/19/2015   Procedure: TRANSESOPHAGEAL ECHOCARDIOGRAM (TEE);  Surgeon: Josue Hector, MD;  Location: [facility] ENDOSCOPY;  Service: Cardiovascular;  Laterality: N/A;    Social History: Social History   Tobacco Use  . Smoking status:  Former Smoker    Packs/day: 0.30    Types: Cigarettes    Last attempt to quit: 11/01/2011    Years since quitting: 5.7  . Smokeless tobacco: Never Used  Substance Use Topics  . Alcohol use: No    Alcohol/week: 0.0 oz  . Drug use: No   Additional social history: reports smoking for 20 years and stopped 15 years ago, no alcohol and no drugs  Please also refer to relevant sections of EMR.  Family History: Family History  Problem Relation Age of Onset  . Cancer Mother 4       breast  . Alzheimer's disease Mother   . Cancer Brother   . Hyperlipidemia Brother   . Prostate cancer Father   . Lung cancer Father     Allergies and Medications: Allergies  Allergen Reactions  . Iodinated  Diagnostic Agents Shortness Of Breath and Itching    States she had this reaction connected with a MRI of the cervical spine.  . Shellfish Allergy Anaphylaxis and Other (See Comments)    All seafood  . Statins    No current facility-administered medications on file prior to encounter.    Current Outpatient Medications on File Prior to Encounter  Medication Sig Dispense Refill  . clopidogrel (PLAVIX) 75 MG tablet TAKE 1 TABLET BY MOUTH DAILY 30 tablet 5  . doxepin (SINEQUAN) 100 MG capsule Take 2 capsules (200 mg total) by mouth at bedtime. 180 capsule 1  . esomeprazole (NEXIUM) 20 MG capsule Take 1 capsule (20 mg total) by mouth daily. 30 capsule 5  . furosemide (LASIX) 20 MG tablet Take 20 mg by mouth daily.   6  . gabapentin (NEURONTIN) 600 MG tablet TAKE 1 TABLET BY MOUTH TWICE DAILY 180 tablet 0  . lidocaine (LIDODERM) 5 % Place 2 patches onto the skin daily. Remove & Discard patch within 12 hours or as directed by MD 60 patch 5  . meclizine (ANTIVERT) 25 MG tablet Take 1 tablet (25 mg total) by mouth 2 (two) times daily as needed for dizziness. 20 tablet 0  . ramipril (ALTACE) 2.5 MG capsule Do not take this till you go back to the Clinic and they ask you to restart it. (Patient taking differently: Take 2.5 mg by mouth daily. Do not take this till you go back to the Clinic and they ask you to restart it.) 90 capsule 3  . rosuvastatin (CRESTOR) 10 MG tablet Take 1 tablet (10 mg total) by mouth daily. 90 tablet 0  . sertraline (ZOLOFT) 50 MG tablet Take 1 tablet (50 mg total) by mouth 2 (two) times daily. 60 tablet 11  . tiZANidine (ZANAFLEX) 2 MG tablet Take 1 tablet (2 mg total) by mouth 3 (three) times daily. 270 tablet 3  . zonisamide (ZONEGRAN) 100 MG capsule Take 1 capsule (100 mg total) by mouth 2 (two) times daily. 60 capsule 11  . zonisamide (ZONEGRAN) 25 MG capsule Take an extra 25 mg twice a day for 2 weeks, then take 50 mg twice a day for a total of 150 mg twice a day. 120  capsule 11    Objective: BP (!) 154/77   Pulse 68   Temp 98.4 F (36.9 C) (Oral)   Resp 15   Ht 5\' 7"  (1.702 m)   Wt 165 lb (74.8 kg)   SpO2 100%   BMI 25.84 kg/m  Exam: General: NAD, pleasant Eyes: PERRL, EOMI, no conjunctival pallor or injection ENTM: Moist mucous membranes, no pharyngeal  erythema or exudate Neck: Supple, no LAD Cardiovascular: RRR, no m/r/g, no LE edema Respiratory: CTA BL, normal work of breathing Gastrointestinal: soft, nontender, nondistended, normoactive BS MSK: moves 4 extremities equally Derm: no rashes appreciated Neuro: CN II-XII grossly intact Psych: AO, appropriate affect  Labs and Imaging: CBC BMET  Recent Labs  Lab 07/26/17 0957  WBC 5.0  HGB 14.3  HCT 44.5  PLT 213   Recent Labs  Lab 07/26/17 0957  NA 140  K 4.1  CL 107  CO2 24  BUN 19  CREATININE 1.19*  GLUCOSE 112*  CALCIUM 9.0    istat trop: 0.01 EKG: NSR without signs of ischemia  CXR: FINDINGS: Anterior cervical spinal fusion hardware. Stable cardiac and mediastinal contours. No consolidative pulmonary opacities. No pleural effusion or pneumothorax. Unchanged scarring left mid lower lung peripherally. Thoracic spine degenerative changes. Cholecystectomy clips.  IMPRESSION: No acute cardiopulmonary process.  Shirley, [place], DO 07/26/2017, 1:05 PM PGY-1, [facility] pager: [phone], text pages welcome  UPPER LEVEL ADDENDUM  I have read the above note and made revisions highlighted in blue.  Smiley Houseman, MD PGY-2 [facility] Pager [phone]

## 2017-07-26 NOTE — Progress Notes (Signed)
Patient is DNR. DNR armband is placed on patient.  De Nurse, RN

## 2017-07-26 NOTE — Progress Notes (Signed)
Patient arrived from ED via wheelchair. Patient is alert and oriented x4. Patient walked to bed from wheelchair w/o any issues. IV is saline locked. Patient is oriented to room and call light is within reach.   De Nurse, RN

## 2017-07-26 NOTE — ED Provider Notes (Addendum)
[facility] EMERGENCY DEPARTMENT Provider Note   CSN: [id number] Arrival date & time: 07/26/17  7893     History   Chief Complaint Chief Complaint  Patient presents with  . Leg Pain    HPI [name] is a 67 y.o. female.  HPI   [name] is a 67 y.o. female, with a history of stroke, CKD stage III, hyperlipidemia, and GERD, presenting to the ED with right calf pain for last 3 weeks.  Pain is described as a burning, 9/10, nonradiating. Patient also endorses exertional shortness of breath that began at around the same time.  Denies fever/chills, nausea/vomiting, chest pain, weakness, numbness, cough, orthopnea, dizziness, or any other complaints. Denies history of DVT/PE, recent surgery, recent hospitalizations, recent trauma, hormonal therapy.  Patient was evaluated by her PCP on May 14.  Duplex ultrasound of the lower extremity was recommended at that time, but patient reportedly stated she did not want to go to the ED. D-dimer was performed instead, which returned positive at 0.86.  Initial attempt at notified the patient failed, however, patient was then able to be notified on May 17 and was instructed to come to the ED.   Past Medical History:  Diagnosis Date  . Chronic kidney disease    stage 3  . GERD (gastroesophageal reflux disease)   . Hyperlipidemia   . Migraine headache   . Occipital neuralgia    Left  . Renal insufficiency   . Stroke [facility]) 10/2013    Patient Active Problem List   Diagnosis Date Noted  . Dyspnea 07/26/2017  . Sinus infection 04/18/2017  . Right ear pain 01/02/2017  . Easy bruising 11/10/2016  . Hospital discharge follow-up 01/25/2016  . Pneumonia 01/14/2016  . SOB (shortness of breath)   . Difficulty in walking, not elsewhere classified   . Diarrhea of presumed infectious origin   . LFT elevation   . Hypokalemia   . Enteritis due to Clostridium difficile   . Pleural effusion on right   . Bilateral  pleural effusion   . Biloma   . Biloma following surgery   . Status post cholecystectomy   . Infection by Streptococcus, viridans group   . Bile leak, postoperative   . Sepsis ([place]) 12/11/2015  . Symptomatic cholelithiasis 12/05/2015  . HLD (hyperlipidemia) 04/02/2015  . Chronic migraine without aura, with status migrainosus 09/26/2014  . Dizziness 11/09/2013  . Adjustment disorder with mixed anxiety and depressed mood 11/03/2013  . Migraine variant 10/10/2013  . Disturbance of skin sensation 10/10/2013  . TIA (transient ischemic attack) 10/10/2013  . HTN (hypertension) 07/18/2013  . 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 Neuralgia, neuritis, and radiculitis, unspecified 02/04/2012  . 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 Myofascial muscle pain 07/15/2011    Past Surgical History:  Procedure Laterality Date  . ABDOMINAL HYSTERECTOMY    . BILIARY STENT PLACEMENT N/A 12/12/2015   Procedure: BILIARY STENT PLACEMENT;  Surgeon: Doran Stabler, MD;  Location: [facility] ENDOSCOPY;  Service: Endoscopy;  Laterality: N/A;  . BLADDER REPAIR    . CARPAL TUNNEL RELEASE    . CERVICAL SPINE SURGERY    . CHOLECYSTECTOMY N/A 12/06/2015   Procedure: LAPAROSCOPIC CHOLECYSTECTOMY WITH  INTRAOPERATIVE CHOLANGIOGRAM;  Surgeon: Stark Klein, MD;  Location: [facility];  Service: General;  Laterality: N/A;  . ELBOW SURGERY    . ERCP N/A 12/12/2015   Procedure: ENDOSCOPIC RETROGRADE CHOLANGIOPANCREATOGRAPHY (ERCP);  Surgeon: Doran Stabler, MD;  Location: [facility];  Service:  Endoscopy;  Laterality: N/A;  . ESOPHAGOGASTRODUODENOSCOPY N/A 01/28/2016   Procedure: ESOPHAGOGASTRODUODENOSCOPY (EGD) **Biliary STENT removal**,  Surgeon: Doran Stabler, MD;  Location: [facility] ENDOSCOPY;  Service: Gastroenterology;  Laterality: N/A;  . IR GENERIC HISTORICAL  12/17/2015   IR US GUIDE BX ASP/DRAIN 12/17/2015 [facility]  . IR GENERIC HISTORICAL  12/17/2015   IR GUIDED DRAIN W CATHETER PLACEMENT 12/17/2015 [facility]   . IR GENERIC HISTORICAL  12/17/2015   IR SINUS/FIST TUBE CHK-NON GI 12/17/2015 [facility]  . KNEE SURGERY    . LUNG SURGERY    . TEE WITHOUT CARDIOVERSION N/A 12/19/2015   Procedure: TRANSESOPHAGEAL ECHOCARDIOGRAM (TEE);  Surgeon: Josue Hector, MD;  Location: [facility] ENDOSCOPY;  Service: Cardiovascular;  Laterality: N/A;     OB History   None      Home Medications    Prior to Admission medications   Medication Sig Start Date End Date Taking? Authorizing Provider  clopidogrel (PLAVIX) 75 MG tablet TAKE 1 TABLET BY MOUTH DAILY 08/27/16  Yes McKeag, Marylynn Pearson, MD  doxepin (SINEQUAN) 100 MG capsule Take 2 capsules (200 mg total) by mouth at bedtime. 10/15/14  Yes Kathrynn Ducking, MD  esomeprazole (NEXIUM) 20 MG capsule Take 1 capsule (20 mg total) by mouth daily. 07/13/17  Yes Lovenia Kim, MD  furosemide (LASIX) 40 MG tablet Take 40 mg by mouth daily. 07/24/17  Yes [provider]  gabapentin (NEURONTIN) 600 MG tablet TAKE 1 TABLET BY MOUTH TWICE DAILY 04/27/17  Yes Kathrynn Ducking, MD  lidocaine (LIDODERM) 5 % Place 2 patches onto the skin daily. Remove & Discard patch within 12 hours or as directed by MD 06/01/17  Yes Kirsteins, Luanna Salk, MD  meclizine (ANTIVERT) 25 MG tablet Take 1 tablet (25 mg total) by mouth 2 (two) times daily as needed for dizziness. 07/16/15  Yes McKeag, Marylynn Pearson, MD  ramipril (ALTACE) 2.5 MG capsule Do not take this till you go back to the Clinic and they ask you to restart it. Patient taking differently: Take 2.5 mg by mouth daily. Do not take this till you go back to the Clinic and they ask you to restart it. 01/07/16  Yes Earnstine Regal, PA-C  rosuvastatin (CRESTOR) 10 MG tablet Take 1 tablet (10 mg total) by mouth daily. 04/02/17  Yes Lovenia Kim, MD  sertraline (ZOLOFT) 50 MG tablet Take 1 tablet (50 mg total) by mouth 2 (two) times daily. 11/04/16  Yes Lovenia Kim, MD  tiZANidine (ZANAFLEX) 2 MG tablet Take 1 tablet (2 mg total) by mouth 3 (three) times  daily. 07/01/17  Yes Ward Givens, NP  zonisamide (ZONEGRAN) 100 MG capsule Take 1 capsule (100 mg total) by mouth 2 (two) times daily. 07/01/17  Yes Ward Givens, NP  zonisamide (ZONEGRAN) 25 MG capsule Take an extra 25 mg twice a day for 2 weeks, then take 50 mg twice a day for a total of 150 mg twice a day. Patient taking differently: Take 50 mg by mouth 2 (two) times daily. take 50 mg twice a day 07/01/17  Yes Ward Givens, NP    Family History Family History  Problem Relation Age of Onset  . Cancer Mother 8       breast  . Alzheimer's disease Mother   . Cancer Brother   . Hyperlipidemia Brother   . Prostate cancer Father   . Lung cancer Father     Social History Social History   Tobacco Use  . Smoking status: Former  Smoker    Packs/day: 0.30    Types: Cigarettes    Last attempt to quit: 11/01/2011    Years since quitting: 5.7  . Smokeless tobacco: Never Used  Substance Use Topics  . Alcohol use: No    Alcohol/week: 0.0 oz  . Drug use: No     Allergies   Iodinated diagnostic agents; Shellfish allergy; and Statins   Review of Systems Review of Systems  Constitutional: Negative for chills, diaphoresis and fever.  Respiratory: Positive for shortness of breath. Negative for cough.   Cardiovascular: Positive for leg swelling. Negative for chest pain and palpitations.  Gastrointestinal: Negative for abdominal pain, diarrhea, nausea and vomiting.  Musculoskeletal: Positive for myalgias.  Neurological: Negative for dizziness, syncope, weakness, light-headedness and numbness.  All other systems reviewed and are negative.    Physical Exam Updated Vital Signs BP (!) 162/94 (BP Location: Right Arm)   Pulse 77   Temp 98.4 F (36.9 C) (Oral)   Resp 12   Ht 5\' 7"  (1.702 m)   Wt 74.8 kg (165 lb)   SpO2 98%   BMI 25.84 kg/m   Physical Exam  Constitutional: She appears well-developed and well-nourished. No distress.  HENT:  Head: Normocephalic and  atraumatic.  Eyes: Conjunctivae are normal.  Neck: Neck supple.  Cardiovascular: Normal rate, regular rhythm, normal heart sounds and intact distal pulses.  Pulses:      Dorsalis pedis pulses are 2+ on the right side, and 2+ on the left side.       Posterior tibial pulses are 2+ on the right side, and 2+ on the left side.  Pulmonary/Chest: Effort normal and breath sounds normal. No respiratory distress.  No increased work of breathing.  Speaks in full sentences without difficulty.  Abdominal: Soft. There is no tenderness. There is no guarding.  Musculoskeletal: She exhibits tenderness. She exhibits no edema.  Tenderness to the right calf without noted swelling, erythema, or increased warmth. No tenderness to the left lower extremity.  Lymphadenopathy:    She has no cervical adenopathy.  Neurological: She is alert.  Sensation to light touch intact in the bilateral lower extremities. Strength 5/5 in the bilateral lower extremities.  Skin: Skin is warm and dry. She is not diaphoretic.  Psychiatric: She has a normal mood and affect. Her behavior is normal.  Nursing note and vitals reviewed.    ED Treatments / Results  Labs (all labs ordered are listed, but only abnormal results are displayed) Labs Reviewed  COMPREHENSIVE METABOLIC PANEL - Abnormal; Notable for the following components:      Result Value   Glucose, Bld 112 (*)    Creatinine, Ser 1.19 (*)    GFR calc non Af Amer 47 (*)    GFR calc Af Amer 54 (*)    All other components within normal limits  CBC WITH DIFFERENTIAL/PLATELET  PROTIME-INR  APTT  I-STAT TROPONIN, ED    EKG EKG Interpretation  Date/Time:  Sunday Jul 26 2017 11:02:30 EDT Ventricular Rate:  64 PR Interval:  138 QRS Duration: 74 QT Interval:  432 QTC Calculation: 445 R Axis:   69 Text Interpretation:  Poor data quality, interpretation may be adversely affected Normal sinus rhythm Cannot rule out Anterior infarct , age undetermined Abnormal ECG  Confirmed by Julianne Rice [phone]) on 07/26/2017 2:42:54 PM    Radiology Dg Chest 2 View  Result Date: 07/26/2017 CLINICAL DATA:  Patient with shortness of breath. EXAM: CHEST - 2 VIEW COMPARISON:  Chest radiograph 01/13/2016.  FINDINGS: Anterior cervical spinal fusion hardware. Stable cardiac and mediastinal contours. No consolidative pulmonary opacities. No pleural effusion or pneumothorax. Unchanged scarring left mid lower lung peripherally. Thoracic spine degenerative changes. Cholecystectomy clips. IMPRESSION: No acute cardiopulmonary process. Electronically Signed   By: Lovey Newcomer M.D.   On: 07/26/2017 11:57   VASCULAR LAB PRELIMINARY  PRELIMINARY  PRELIMINARY  PRELIMINARY  Right lower extremity venous duplex completed.    Preliminary report:  There is no DVT or SVT noted in the right lower extremity.   KANADY, CANDACE, RVT 07/26/2017, 11:05 AM   Procedures Procedures (including critical care time)  Medications Ordered in ED Medications - No data to display   Initial Impression / Assessment and Plan / ED Course  I have reviewed the triage vital signs and the nursing notes.  Pertinent labs & imaging results that were available during my care of the patient were reviewed by me and considered in my medical decision making (see chart for details).  Clinical Course as of Jul 27 1511  Sun Jul 26, 2017  3419 Patient not yet in bed.   [SJ]  32 Spoke with Dr. Enid Derry, [facility] resident. States they will come see the patient.    [SJ]    Clinical Course User Index [SJ] Joy, Shawn C, PA-C   Patient presents with right lower leg pain and swelling for the last 3 weeks.  Patient also endorses shortness of breath over the same time period.  Duplex ultrasound of right lower extremity negative for DVT. Due to the patient's complaint of shortness of breath accompanied by a positive d-dimer, she will need additional investigation for PE.  Due to her significant allergy to IV dye, she  will need to undergo VQ scan, for which she will need to be admitted.   Findings and plan of care discussed with Julianne Rice, MD.   Vitals:   07/26/17 1011 07/26/17 1228 07/26/17 1447 07/26/17 1507  BP: (!) 144/85 (!) 154/77 (!) 179/90 (!) 178/94  Pulse: 66 68 64 68  Resp: 14 15 16 18   Temp:    98.5 F (36.9 C)  TempSrc:    Oral  SpO2: 99% 100% 99% 100%  Weight:      Height:         Final Clinical Impressions(s) / ED Diagnoses   Final diagnoses:  Shortness of breath  Right leg pain  Positive D-dimer    ED Discharge Orders    None       Layla Maw 07/26/17 1322    Lorayne Bender, PA-C 07/26/17 1514    Julianne Rice, MD 07/30/17 1309

## 2017-07-26 NOTE — Progress Notes (Signed)
VASCULAR LAB PRELIMINARY  PRELIMINARY  PRELIMINARY  PRELIMINARY  Right lower extremity venous duplex completed.    Preliminary report:  There is no DVT or SVT noted in the right lower extremity.   Rishon Thilges, RVT 07/26/2017, 11:05 AM

## 2017-07-26 NOTE — ED Triage Notes (Signed)
Pt. Stated, Ive had right leg pain for 3 weeks. Dr. Reesa Chew sent here cause she could not get an appt set up. Needs a venous study

## 2017-07-26 NOTE — ED Notes (Signed)
Patient transported to X-ray 

## 2017-07-27 ENCOUNTER — Encounter (HOSPITAL_COMMUNITY): Payer: Self-pay | Admitting: Cardiology

## 2017-07-27 ENCOUNTER — Observation Stay (HOSPITAL_COMMUNITY): Payer: Medicare Other

## 2017-07-27 DIAGNOSIS — I2510 Atherosclerotic heart disease of native coronary artery without angina pectoris: Secondary | ICD-10-CM | POA: Diagnosis not present

## 2017-07-27 DIAGNOSIS — R7989 Other specified abnormal findings of blood chemistry: Secondary | ICD-10-CM

## 2017-07-27 DIAGNOSIS — I2699 Other pulmonary embolism without acute cor pulmonale: Secondary | ICD-10-CM | POA: Diagnosis not present

## 2017-07-27 DIAGNOSIS — R0602 Shortness of breath: Secondary | ICD-10-CM | POA: Diagnosis not present

## 2017-07-27 DIAGNOSIS — M79604 Pain in right leg: Secondary | ICD-10-CM | POA: Diagnosis not present

## 2017-07-27 DIAGNOSIS — I2584 Coronary atherosclerosis due to calcified coronary lesion: Secondary | ICD-10-CM | POA: Diagnosis not present

## 2017-07-27 DIAGNOSIS — R0600 Dyspnea, unspecified: Secondary | ICD-10-CM

## 2017-07-27 LAB — BASIC METABOLIC PANEL
Anion gap: 10 (ref 5–15)
BUN: 20 mg/dL (ref 6–20)
CO2: 29 mmol/L (ref 22–32)
Calcium: 9.1 mg/dL (ref 8.9–10.3)
Chloride: 102 mmol/L (ref 101–111)
Creatinine, Ser: 1.4 mg/dL — ABNORMAL HIGH (ref 0.44–1.00)
GFR calc Af Amer: 45 mL/min — ABNORMAL LOW (ref >60)
GFR calc non Af Amer: 38 mL/min — ABNORMAL LOW (ref >60)
Glucose, Bld: 121 mg/dL — ABNORMAL HIGH (ref 65–99)
Potassium: 4.3 mmol/L (ref 3.5–5.1)
Sodium: 141 mmol/L (ref 135–145)

## 2017-07-27 LAB — URINALYSIS, ROUTINE W REFLEX MICROSCOPIC
Bacteria, UA: NONE SEEN
Bilirubin Urine: NEGATIVE
Glucose, UA: NEGATIVE mg/dL
Ketones, ur: NEGATIVE mg/dL
Leukocytes, UA: NEGATIVE
Nitrite: NEGATIVE
Protein, ur: NEGATIVE mg/dL
Specific Gravity, Urine: 1.011 (ref 1.005–1.030)
pH: 7 (ref 5.0–8.0)

## 2017-07-27 LAB — TROPONIN I: Troponin I: <0.03 ng/mL (ref <0.03)

## 2017-07-27 LAB — HIV ANTIBODY (ROUTINE TESTING W REFLEX): HIV Screen 4th Generation wRfx: NONREACTIVE

## 2017-07-27 MED ORDER — TECHNETIUM TC 99M DIETHYLENETRIAME-PENTAACETIC ACID
32.7000 | Freq: Once | INTRAVENOUS | Status: AC | PRN
  Administered 2017-07-27: 32.7 via RESPIRATORY_TRACT

## 2017-07-27 MED ORDER — APIXABAN 5 MG PO TABS
10.0000 mg | ORAL_TABLET | Freq: Two times a day (BID) | ORAL | Status: DC
  Administered 2017-07-27 – 2017-07-28 (×2): 10 mg via ORAL
  Filled 2017-07-27 (×3): qty 2

## 2017-07-27 MED ORDER — TECHNETIUM TO 99M ALBUMIN AGGREGATED
4.2000 | Freq: Once | INTRAVENOUS | Status: AC | PRN
  Administered 2017-07-27: 4.2 via INTRAVENOUS

## 2017-07-27 MED ORDER — APIXABAN 5 MG PO TABS: 5.0000 mg | ORAL_TABLET | Freq: Two times a day (BID) | ORAL | Status: DC

## 2017-07-27 MED ORDER — RIVAROXABAN 15 MG PO TABS: 15.0000 mg | ORAL_TABLET | Freq: Two times a day (BID) | ORAL | Status: DC

## 2017-07-27 NOTE — Care Management Obs Status (Signed)
[place] NOTIFICATION   Patient Details  Name: [name] MRN: [id number] Date of Birth: May 07, 1950   Medicare Observation Status Notification Given:  Yes    Kristen Cardinal, RN 07/27/2017, 2:31 PM

## 2017-07-27 NOTE — Progress Notes (Signed)
FPTS Interim Progress Note   O: BP 102/78 (BP Location: Right Arm)   Pulse 79   Temp 98.1 F (36.7 C) (Oral)   Resp 18   Ht 5\' 7"  (1.702 m)   Wt 164 lb 14.5 oz (74.8 kg)   SpO2 96%   BMI 25.83 kg/m     A/P: VQ read as PIOPED 20-79% chance of PE with upper right wedge  Given Wells of zero, stable clinical presentation with no tachycardia, no O2 requirement patient is ~1% chance of PE at this point and we will not be pursuing further PE workup/treatment  Cardiology has been consulted to evaluate potential cardiac cause of exertional dyspnea/diaphoresis   Sherene Sires, DO 07/27/2017, 2:02 PM PGY-1, [facility] Service pager [phone]

## 2017-07-27 NOTE — Progress Notes (Addendum)
ANTICOAGULATION CONSULT NOTE - Initial Consult  Pharmacy Consult for Eliquis Indication: pulmonary embolus  Allergies  Allergen Reactions  . Iodinated Diagnostic Agents Shortness Of Breath and Itching    States she had this reaction connected with a MRI of the cervical spine.  . Shellfish Allergy Anaphylaxis and Other (See Comments)    All seafood  . Statins Other (See Comments)    Arthralgias (severe) with atorvastatin, mild arthralgias with rosuvastatin but willing to continue taking it    Patient Measurements: Height: 5\' 7"  (170.2 cm) Weight: 164 lb 14.5 oz (74.8 kg) IBW/kg (Calculated) : 61.6  Vital Signs: Temp: 98.1 F (36.7 C) (05/20 0405) Temp Source: Oral (05/20 0405) BP: 102/78 (05/20 0405) Pulse Rate: 79 (05/20 0405)  Labs: Recent Labs    07/26/17 0957 07/26/17 1616 07/26/17 2218 07/27/17 0437 07/27/17 0657  HGB 14.3  --   --   --   --   HCT 44.5  --   --   --   --   PLT 213  --   --   --   --   APTT 33  --   --   --   --   LABPROT 12.2  --   --   --   --   INR 0.91  --   --   --   --   CREATININE 1.19*  --   --   --  1.40*  TROPONINI  --  <0.03 <0.03 <0.03  --     Estimated Creatinine Clearance: 42.3 mL/min (A) (by C-G formula based on SCr of 1.4 mg/dL (H)).   Assessment: 53 YOF found to have PE on VQ scan, dopplers negative for DVT. Not on anticoagulation PTA, was on prophylactic Lovenox 40mg - last dose given yesterday evening. CBC wnl, no bleeding noted.  Goal of Therapy:    Monitor platelets by anticoagulation protocol: Yes   Plan:  Eliquis 10mg  PO BID x 7 full days followed by 5mg  PO BID starting 08/04/2017 Follow CBC and s/s bleeding Will provide education prior to discharge  Abree Romick D. Kayvion Arneson, PharmD, BCPS Clinical Pharmacist [phone] 07/27/2017 3:29 PM

## 2017-07-27 NOTE — Progress Notes (Signed)
O2 SAT AT REST ON RA 97% O2 WHILE AMBULATING ON RA 96%-97%

## 2017-07-27 NOTE — Consult Note (Addendum)
Cardiology Consultation:   Patient ID: [name]; [id number]; 15-Feb-1951   Admit date: 07/26/2017 Date of Consult: 07/27/2017  Primary Care Provider: Lovenia Kim, MD Primary Cardiologist: Skeet Latch, MD New Primary Electrophysiologist:  NA   Patient Profile:   [name] is a 67 y.o. female with a hx of CKD, GERD, HLD and hx of CVA in 2015 who is being seen today for the evaluation of exertional dyspnea and diaphoresis at the request of Dr. Ardelia Mems.Marland Kitchen  History of Present Illness:   [name] has a hx of CKD, GERD, HLD and hx of CVA in 2015 presented to ER with Rt leg pain for 3 weeks.   Her PCP did ddimer and it was 0.89.  She had been unable to get appt for venous doppler so presented to ER.  Venous doppler in ER was neg for DVT in Rt lower ext.  She also did complain of dyspnea.    She tells me since the leg was swollen and painful she would have increase in her stable dyspnea with exertion.  She was also break out in significant diaphoresis.  Some mild chest discomfort.  It has continued for 3 weeks.   Only occurs with exertion.  Leg pain is only thing that has awakened from sleep.  VQ scan with Intermediate probability for pulmonary embolism (20-79%) by PIOPED II criteria.  No hx of HTN, + CVA., on CTA in 2017 she was noted to have 50% diameter stenosis at origin of Lt. Subclavian, multiple foci of coronary artery calcification.      Labs Na 141, Cr. 1.40 today and K+ 4.3  troponins neg X 3 at <0.03 HGb 14.3 EKG SR with poor R wave progression.  Currently resting comfortable in bed without SOB.  No chest pain.   Not on tele.      Past Medical History:  Diagnosis Date  . Chronic kidney disease    stage 3  . GERD (gastroesophageal reflux disease)   . Hyperlipidemia   . Migraine headache   . Occipital neuralgia    Left  . Renal insufficiency   . Stroke [name]) 10/2013       Home Medications:  Prior to Admission medications   Medication Sig Start  Date End Date Taking? Authorizing Provider  clopidogrel (PLAVIX) 75 MG tablet TAKE 1 TABLET BY MOUTH DAILY 08/27/16  Yes McKeag, Marylynn Pearson, MD  doxepin (SINEQUAN) 100 MG capsule Take 2 capsules (200 mg total) by mouth at bedtime. 10/15/14  Yes Kathrynn Ducking, MD  esomeprazole (NEXIUM) 20 MG capsule Take 1 capsule (20 mg total) by mouth daily. 07/13/17  Yes Lovenia Kim, MD  furosemide (LASIX) 40 MG tablet Take 40 mg by mouth daily. 07/24/17  Yes [provider]  lidocaine (LIDODERM) 5 % Place 2 patches onto the skin daily. Remove & Discard patch within 12 hours or as directed by MD 06/01/17  Yes Kirsteins, Luanna Salk, MD  meclizine (ANTIVERT) 25 MG tablet Take 1 tablet (25 mg total) by mouth 2 (two) times daily as needed for dizziness. 07/16/15  Yes McKeag, Marylynn Pearson, MD  ramipril (ALTACE) 2.5 MG capsule Do not take this till you go back to the Clinic and they ask you to restart it. Patient taking differently: Take 2.5 mg by mouth daily. Do not take this till you go back to the Clinic and they ask you to restart it. 01/07/16  Yes Earnstine Regal, PA-C  rosuvastatin (CRESTOR) 10 MG tablet Take 1 tablet (10  mg total) by mouth daily. 04/02/17  Yes Lovenia Kim, MD  sertraline (ZOLOFT) 50 MG tablet Take 1 tablet (50 mg total) by mouth 2 (two) times daily. 11/04/16  Yes Lovenia Kim, MD  tiZANidine (ZANAFLEX) 2 MG tablet Take 1 tablet (2 mg total) by mouth 3 (three) times daily. 07/01/17  Yes Ward Givens, NP  zonisamide (ZONEGRAN) 100 MG capsule Take 1 capsule (100 mg total) by mouth 2 (two) times daily. 07/01/17  Yes Ward Givens, NP  zonisamide (ZONEGRAN) 25 MG capsule Take an extra 25 mg twice a day for 2 weeks, then take 50 mg twice a day for a total of 150 mg twice a day. Patient taking differently: Take 50 mg by mouth 2 (two) times daily. take 50 mg twice a day 07/01/17  Yes Millikan, Jinny Blossom, NP  gabapentin (NEURONTIN) 600 MG tablet TAKE 1 TABLET BY MOUTH TWICE DAILY 07/27/17   Ward Givens, NP     Inpatient Medications: Scheduled Meds: . clopidogrel  75 mg Oral Daily  . doxepin  200 mg Oral QHS  . enoxaparin (LOVENOX) injection  40 mg Subcutaneous Q24H  . furosemide  40 mg Oral Daily  . gabapentin  600 mg Oral BID  . ramipril  2.5 mg Oral Daily  . rosuvastatin  10 mg Oral Daily  . sertraline  50 mg Oral BID  . tiZANidine  2 mg Oral TID  . zonisamide  150 mg Oral BID   Continuous Infusions:  PRN Meds: acetaminophen **OR** acetaminophen  Allergies:    Allergies  Allergen Reactions  . Iodinated Diagnostic Agents Shortness Of Breath and Itching    States she had this reaction connected with a MRI of the cervical spine.  . Shellfish Allergy Anaphylaxis and Other (See Comments)    All seafood  . Statins Other (See Comments)    Arthralgias (severe) with atorvastatin, mild arthralgias with rosuvastatin but willing to continue taking it   PMH and PSH have been reviewed.   They were deleted due to astricks.      Social History:   Social History   Socioeconomic History  . Marital status: Married    Spouse name: [name]  . Number of children: 1  . Years of education: 12+  . Highest education level: Not on file  Occupational History  . Occupation: ACTIVITY DIRECTOR    Employer: [employer]  . Occupation: CNA  Social Needs  . Financial resource strain: Not on file  . Food insecurity:    Worry: Not on file    Inability: Not on file  . Transportation needs:    Medical: Not on file    Non-medical: Not on file  Tobacco Use  . Smoking status: Former Smoker    Packs/day: 0.30    Types: Cigarettes    Last attempt to quit: 11/01/2011    Years since quitting: 5.7  . Smokeless tobacco: Never Used  Substance and Sexual Activity  . Alcohol use: No    Alcohol/week: 0.0 oz  . Drug use: No  . Sexual activity: Yes    Partners: Male    Birth control/protection: Surgical    Comment: 1 sexual partner in last 12 months: married  Lifestyle  . Physical activity:     Days per week: Not on file    Minutes per session: Not on file  . Stress: Not on file  Relationships  . Social connections:    Talks on phone: Not on file    Gets together: Not on  file    Attends religious service: Not on file    Active member of club or organization: Not on file    Attends meetings of clubs or organizations: Not on file    Relationship status: Not on file  . Intimate partner violence:    Fear of current or ex partner: Not on file    Emotionally abused: Not on file    Physically abused: Not on file    Forced sexual activity: Not on file  Other Topics Concern  . Not on file  Social History Narrative   Patient is married ([name]).   Patient has one child.   Patient is working full-time.   Patient is right handed.   Patient has a college education.   Patient does not drink caffeine.    Family History:    Family History  Problem Relation Age of Onset  . Cancer Mother 75       breast  . Alzheimer's disease Mother   . Cancer Brother   . Hyperlipidemia Brother   . Prostate cancer Father   . Lung cancer Father      ROS:  Please see the history of present illness.  General:no colds or fevers, no weight changes Skin:no rashes or ulcers HEENT:no blurred vision, no congestion CV:see HPI PUL:see HPI GI:no diarrhea constipation or melena, no indigestion GU:no hematuria, no dysuria MS:no joint pain, no claudication Neuro:no syncope, no lightheadedness Endo:no diabetes, no thyroid disease  All other ROS reviewed and negative.     Physical Exam/Data:   Vitals:   07/26/17 1507 07/26/17 2331 07/27/17 0405 07/27/17 0500  BP: (!) 178/94 131/72 102/78   Pulse: 68 67 79   Resp: 18 20 18    Temp: 98.5 F (36.9 C) 98.2 F (36.8 C) 98.1 F (36.7 C)   TempSrc: Oral Oral Oral   SpO2: 100% 97% 96%   Weight:    164 lb 14.5 oz (74.8 kg)  Height:        Intake/Output Summary (Last 24 hours) at 07/27/2017 1522 Last data filed at 07/27/2017 0815 Gross per 24 hour   Intake 480 ml  Output 300 ml  Net 180 ml   Filed Weights   07/26/17 0732 07/27/17 0500  Weight: 165 lb (74.8 kg) 164 lb 14.5 oz (74.8 kg)   Body mass index is 25.83 kg/m.  General:  Well nourished, well developed, in no acute distress, sitting in bed without problems HEENT: normal Lymph: no adenopathy Neck: no JVD Endocrine:  No thryomegaly Vascular: No carotid bruits; pedal pulses 2+ bilaterally   Cardiac:  normal S1, S2; RRR; no murmur gallup rub or click Lungs:  clear to auscultation bilaterally, no wheezing, rhonchi or rales  Abd: soft, nontender, no hepatomegaly  Ext: no edema Musculoskeletal:  No deformities, BUE and BLE strength normal and equal Skin: warm and dry  Neuro:  Alert and oriented X 3 MAE follows commands, no focal abnormalities noted Psych:  Normal affect     Relevant CV Studies: Echo 07/24/17  Study Conclusions  - Left ventricle: The cavity size was normal. Systolic function was   normal. The estimated ejection fraction was in the range of 55%   to 60%. Wall motion was normal; there were no regional wall   motion abnormalities. Doppler parameters are consistent with   abnormal left ventricular relaxation (grade 1 diastolic   dysfunction).  Prior echo 12/17/15  Study Conclusions  - Left ventricle: The cavity size was normal. There was mild focal  basal and mild concentric hypertrophy of the septum. Systolic   function was normal. The estimated ejection fraction was in the   range of 60% to 65%. Wall motion was normal; there were no   regional wall motion abnormalities. Doppler parameters are   consistent with abnormal left ventricular relaxation (grade 1   diastolic dysfunction). Doppler parameters are consistent with   elevated ventricular end-diastolic filling pressure. - Aortic valve: Trileaflet; normal thickness leaflets. There was no   regurgitation. - Mitral valve: Structurally normal valve. - Left atrium: The atrium was normal in  size. - Right ventricle: The cavity size was normal. Wall thickness was   normal. Systolic function was normal. - Tricuspid valve: There was no regurgitation. - Pulmonary arteries: Systolic pressure could not be accurately   estimated. - Inferior vena cava: The vessel was normal in size. - Pericardium, extracardiac: There was no pericardial effusion.   Laboratory Data:  Chemistry Recent Labs  Lab 07/21/17 1513 07/26/17 0957 07/27/17 0657  NA 143 140 141  K 4.6 4.1 4.3  CL 102 107 102  CO2 24 24 29   GLUCOSE 93 112* 121*  BUN 20 19 20   CREATININE 1.36* 1.19* 1.40*  CALCIUM 9.4 9.0 9.1  GFRNONAA 41* 47* 38*  GFRAA 47* 54* 45*  ANIONGAP  --  9 10    Recent Labs  Lab 07/26/17 0957  PROT 7.0  ALBUMIN 4.2  AST 24  ALT 21  ALKPHOS 101  BILITOT 0.6   Hematology Recent Labs  Lab 07/21/17 1513 07/26/17 0957  WBC 5.7 5.0  RBC 4.62 5.04  HGB 13.2 14.3  HCT 40.6 44.5  MCV 88 88.3  MCH 28.6 28.4  MCHC 32.5 32.1  RDW 13.6 13.1  PLT 237 213   Cardiac Enzymes Recent Labs  Lab 07/26/17 1616 07/26/17 2218 07/27/17 0437  TROPONINI <0.03 <0.03 <0.03    Recent Labs  Lab 07/26/17 1005  TROPIPOC 0.01    BNPNo results for input(s): BNP, PROBNP in the last 168 hours.  DDimer  Recent Labs  Lab 07/21/17 1522  DDIMER 0.89*    Radiology/Studies:  Dg Chest 2 View  Result Date: 07/26/2017 CLINICAL DATA:  Patient with shortness of breath. EXAM: CHEST - 2 VIEW COMPARISON:  Chest radiograph 01/13/2016. FINDINGS: Anterior cervical spinal fusion hardware. Stable cardiac and mediastinal contours. No consolidative pulmonary opacities. No pleural effusion or pneumothorax. Unchanged scarring left mid lower lung peripherally. Thoracic spine degenerative changes. Cholecystectomy clips. IMPRESSION: No acute cardiopulmonary process. Electronically Signed   By: Lovey Newcomer M.D.   On: 07/26/2017 11:57   Nm Pulmonary Perf And Vent  Result Date: 07/27/2017 CLINICAL DATA:  Inpatient.  Dyspnea on exertion and lower extremity swelling for 3 weeks. Recent elevation of D-dimer. EXAM: NUCLEAR MEDICINE VENTILATION - PERFUSION LUNG SCAN TECHNIQUE: Ventilation images were obtained in multiple projections using inhaled aerosol Tc-32m DTPA. Perfusion images were obtained in multiple projections after intravenous injection of Tc-82m-MAA. RADIOPHARMACEUTICALS:  32.7 mCi of Tc-52m DTPA aerosol inhalation and 4.2 mCi Tc33m-MAA IV COMPARISON:  Chest radiograph from one day prior. FINDINGS: Ventilation: No focal ventilation defect. Perfusion: There is a wedge-shaped moderate mismatched perfusion defect in the superior segment right lower lobe. No additional perfusion defects. IMPRESSION: Intermediate probability for pulmonary embolism (20-79%) by PIOPED II criteria. Electronically Signed   By: Ilona Sorrel M.D.   On: 07/27/2017 13:36    Assessment and Plan:   1. Dyspnea, rt calf pain   --elevated ddimer, neg venous doppler and intermediate VQ  scan  +PE   Neg MI.  Add Eliquis and continue Plavix.  . Dr. Oval Linsey to see.  2.  HTN on lasix and ramipril.  3.  HLD on crestor  4.          CAD on CTA of chest in 2017 small areas of calcification in coronary arteries.   For questions or updates, please contact [place] Please consult [email or web address] for contact info under Cardiology/STEMI.   Signed, Cecilie Kicks, NP  07/27/2017 3:22 PM

## 2017-07-27 NOTE — Care Management Note (Addendum)
Case Management Note  Patient Details  Name: [name] MRN: [id number] Date of Birth: Aug 19, 1950  Subjective/Objective:  History of HTN, HLD, migraines, h/o CVA, GERD, CKD stage 3.  Admitted for Dyspnea.              Action/Plan: If VQ neg and cardiology not planning inpatient workup, patient can likely be discharged home today 07/27/2017.  NCM will continue to follow for discharge needs.  Expected Discharge Date:   07/27/2017               Expected Discharge Plan:  Home/Self Care  In-House Referral:  Clinical Social Work  Discharge planning Services  CM Consult  Status of Service:  In process, will continue to follow  Kristen Cardinal, RN 07/27/2017, 11:59 AM

## 2017-07-27 NOTE — Progress Notes (Signed)
Family Medicine Teaching Service Daily Progress Note Intern Pager: [phone]  Patient name: [name] Medical record number: [id number] Date of birth: [birth year] Age: 67 y.o. Gender: female  Primary Care Provider: Lovenia Kim, MD Consultants: potentially cards 5/20 am Code Status: dnr  Pt Overview and Major Events to Date:  5/19 admitted  Assessment and Plan: [name] is a 67 y.o. female presenting with SOB and RLE swelling. PMH is significant for HTN, HLD, migraines, h/o CVA, GERD, CKD stage 3.  Dyspnea/Diaphoresis on Exertion  Chest tightness  LE swelling: Patient with worsening dyspnea/diaphoresis over the last few months on exertion and assymetric  RLE swelling over left for 3 weeks. Has chroinc 2 pillow orthopnea.  Seen in office on 5/14 and D-dimer obtained at that time as patient did not want to go to the ED for doppler. D-Dimer elevated to 0.89. RLE doppler found no DVT.  Allergic to dye contraindicating CTA. Well's score is 0 today on admission.  If VQ scan does not suggest PE will likely do no further pulm work-up.  CXR with no acute cardiopulmonary process. EKG  On admission with no signs of MI but poor quality.  AM ECG 5/20 normal.  Troponins neg x3 so far.  Patient denies any history of PE or DVT but does have TIA hx.  Patient had ECHO on 07/24/2017 which showed EF of 55 to 60% with G1 DD. -1x troponin pending -Cardiology consulted, heart score 5 -Vitals per unit -Follow-up on VQ scan -will ambulate for O2 sats  CKD, stage 3: Cr 1.19 and GFR 47. Unclear of patient's baseline.  Patient followed by nephrology outpatient. -Monitor on daily BMP -Avoid nephrotoxic agents  H/o TIA in 2015: Patient on Plavix and Crestor daily.  Patient with prior history of tobacco use. -Continue Plavix and Crestor  HTN: Patient on Lasix 40 mg daily and ramipril 2.5 mg. -Continue home medications -Monitor BP daily  HLD: Patient on Crestor daily. -Continue home  medications  Migraines: patient reports taking meclizine for migraines, as well as doxepin, zonegran and tizanidine. She is followed by [facility] neurology and saw them last on 07/01/2017 -holding home meclizine at this time and will clarify -continue doxepin, zonegran and tizanidine  Depression: On zoloft 50mg  BID. -continue home zoloft, ask about BID dosing  Dysuria: Patient reports that this is new. No fevers.  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 She currently uses Lidoderm patch, and she uses gabapentin 600 mg 3 times a day -continue gabapentin and supply lidocaine patch if requested  FEN/GI: Heart healthy diet Prophylaxis: Lovenox  Disposition: potential d/c 5/20 if neg V/Q scan  Subjective:  Patient feels well this AM and thinks the swelling is mildly improved, described chronic diaphoresis worsening over the last few months to the point where her husband teases her about sweating through her shirt  Objective: Temp:  [98.1 F (36.7 C)-98.5 F (36.9 C)] 98.1 F (36.7 C) (05/20 0405) Pulse Rate:  [64-79] 79 (05/20 0405) Resp:  [12-20] 18 (05/20 0405) BP: (102-179)/(72-94) 102/78 (05/20 0405) SpO2:  [96 %-100 %] 96 % (05/20 0405) Weight:  [164 lb 14.5 oz (74.8 kg)-165 lb (74.8 kg)] 164 lb 14.5 oz (74.8 kg) (05/20 0500) Physical Exam: General: NAD, pleasant  Eyes: EOMI, no conjunctival pallor or injection ENTM: Moist mucous membranes, no pharyngeal erythema or exudate Neck: Supple, no LAD Cardiovascular: RRR, no m/r/g, ~1+ pitting edema symmetrical in LE Respiratory: CTA BL, normal work of breathing Gastrointestinal:  soft, nontender, nondistended, normoactive BS MSK: moves 4 extremities equally Derm: no rashes appreciated Neuro: CN II-XII grossly intact Psych: AOx3, appropriate affect   Laboratory: Recent Labs  Lab 07/21/17 1513 07/26/17 0957  WBC 5.7 5.0  HGB 13.2 14.3  HCT 40.6 44.5  PLT 237 213   Recent Labs  Lab  07/21/17 1513 07/26/17 0957  NA 143 140  K 4.6 4.1  CL 102 107  CO2 24 24  BUN 20 19  CREATININE 1.36* 1.19*  CALCIUM 9.4 9.0  PROT  --  7.0  BILITOT  --  0.6  ALKPHOS  --  101  ALT  --  21  AST  --  24  GLUCOSE 93 112*     Imaging/Diagnostic Tests: Dg Chest 2 View  Result Date: 07/26/2017 CLINICAL DATA:  Patient with shortness of breath. EXAM: CHEST - 2 VIEW COMPARISON:  Chest radiograph 01/13/2016. FINDINGS: Anterior cervical spinal fusion hardware. Stable cardiac and mediastinal contours. No consolidative pulmonary opacities. No pleural effusion or pneumothorax. Unchanged scarring left mid lower lung peripherally. Thoracic spine degenerative changes. Cholecystectomy clips. IMPRESSION: No acute cardiopulmonary process. Electronically Signed   By: Lovey Newcomer M.D.   On: 07/26/2017 11:57     Sherene Sires, DO 07/27/2017, 6:32 AM PGY-1, [place] Intern pager: [phone], text pages welcome

## 2017-07-28 DIAGNOSIS — R7989 Other specified abnormal findings of blood chemistry: Secondary | ICD-10-CM | POA: Diagnosis not present

## 2017-07-28 DIAGNOSIS — I2699 Other pulmonary embolism without acute cor pulmonale: Secondary | ICD-10-CM | POA: Diagnosis not present

## 2017-07-28 DIAGNOSIS — R0600 Dyspnea, unspecified: Secondary | ICD-10-CM | POA: Diagnosis not present

## 2017-07-28 DIAGNOSIS — M79604 Pain in right leg: Secondary | ICD-10-CM | POA: Diagnosis not present

## 2017-07-28 LAB — BASIC METABOLIC PANEL
Anion gap: 8 (ref 5–15)
BUN: 25 mg/dL — ABNORMAL HIGH (ref 6–20)
CO2: 28 mmol/L (ref 22–32)
Calcium: 8.8 mg/dL — ABNORMAL LOW (ref 8.9–10.3)
Chloride: 102 mmol/L (ref 101–111)
Creatinine, Ser: 1.4 mg/dL — ABNORMAL HIGH (ref 0.44–1.00)
GFR calc Af Amer: 45 mL/min — ABNORMAL LOW (ref >60)
GFR calc non Af Amer: 38 mL/min — ABNORMAL LOW (ref >60)
Glucose, Bld: 130 mg/dL — ABNORMAL HIGH (ref 65–99)
Potassium: 4 mmol/L (ref 3.5–5.1)
Sodium: 138 mmol/L (ref 135–145)

## 2017-07-28 LAB — CBC
HCT: 39.7 % (ref 36.0–46.0)
Hemoglobin: 12.9 g/dL (ref 12.0–15.0)
MCH: 28.5 pg (ref 26.0–34.0)
MCHC: 32.5 g/dL (ref 30.0–36.0)
MCV: 87.8 fL (ref 78.0–100.0)
Platelets: 196 10*3/uL (ref 150–400)
RBC: 4.52 MIL/uL (ref 3.87–5.11)
RDW: 13.1 % (ref 11.5–15.5)
WBC: 5.6 10*3/uL (ref 4.0–10.5)

## 2017-07-28 MED ORDER — APIXABAN 5 MG PO TABS: ORAL_TABLET | ORAL | 0 refills | Status: DC

## 2017-07-28 MED ORDER — APIXABAN (ELIQUIS) EDUCATION KIT FOR DVT/PE PATIENTS: 1.0000 | PACK | Freq: Once | 0 refills | Status: AC

## 2017-07-28 NOTE — Progress Notes (Addendum)
BENEFIT CHECK Per CMA: Memory Argue         #  S/W VAIDA  @ [facility] CHO[facility] RX # [phone]   ELIQUIS 2.50 MG BID  COVER- YES  CO-PAY- $ 42.00  TIER- 3 DRUG  PRIOR APPROVAL- NO    2. ELIQUIS 5 MG BID  COVER- YES  CO-PAY- $ 42.00  TIER- 3 DRUG  PRIOR APPROVAL- NO   PREFERRED PHARMACY : YES- [facility] , [facility], [facility]    NCM in to speak with patient, discussed $42.00 co-pay for Eliquis with patient.  Also provided patient with Free 30-day supply Card for Eliquis.  Patient verbalized understanding.  Patient uses IT trainer.  Montel Culver, RN Nurse Case Manager

## 2017-07-28 NOTE — Discharge Instructions (Signed)
Information on my medicine - ELIQUIS (apixaban)  This medication education was reviewed with me or my healthcare representative as part of my discharge preparation. Why was Eliquis prescribed for you? Eliquis was prescribed to treat blood clots that may have been found in the veins of your legs (deep vein thrombosis) or in your lungs (pulmonary embolism) and to reduce the risk of them occurring again.  What do You need to know about Eliquis ? The starting dose is 10 mg (two 5 mg tablets) taken TWICE daily for the FIRST SEVEN (7) DAYS, then on 08/01/2017  the dose is reduced to ONE 5 mg tablet taken TWICE daily.  Eliquis may be taken with or without food.   Try to take the dose about the same time in the morning and in the evening. If you have difficulty swallowing the tablet whole please discuss with your pharmacist how to take the medication safely.  Take Eliquis exactly as prescribed and DO NOT stop taking Eliquis without talking to the doctor who prescribed the medication.  Stopping may increase your risk of developing a new blood clot.  Refill your prescription before you run out.  After discharge, you should have regular check-up appointments with your healthcare provider that is prescribing your Eliquis.    What do you do if you miss a dose? If a dose of ELIQUIS is not taken at the scheduled time, take it as soon as possible on the same day and twice-daily administration should be resumed. The dose should not be doubled to make up for a missed dose.  Important Safety Information A possible side effect of Eliquis is bleeding. You should call your healthcare provider right away if you experience any of the following: ? Bleeding from an injury or your nose that does not stop. ? Unusual colored urine (red or dark brown) or unusual colored stools (red or black). ? Unusual bruising for unknown reasons. ? A serious fall or if you hit your head (even if there is no bleeding).  Some  medicines may interact with Eliquis and might increase your risk of bleeding or clotting while on Eliquis. To help avoid this, consult your healthcare provider or pharmacist prior to using any new prescription or non-prescription medications, including herbals, vitamins, non-steroidal anti-inflammatory drugs (NSAIDs) and supplements.  This website has more information on Eliquis (apixaban): http://www.eliquis.com/eliquis/home

## 2017-07-28 NOTE — Discharge Summary (Signed)
[facility] Discharge Summary  Patient name: [name] Medical record number: [id number] Date of birth: [birth year] Age: 67 y.o. Gender: female Date of Admission: 07/26/2017  Date of Discharge: 07/28/16 Admitting Physician: Leeanne Rio, MD  Primary Care Provider: Lovenia Kim, MD Consultants: cardiology  Indication for Hospitalization: dyspnea on exertion  Discharge Diagnoses/Problem List:  PE CAD CKD3 Hx of TIA HTN HLD Migraines Depression   Disposition: to home  Discharge Condition: stable  Discharge Exam:  General: NAD, pleasant Eyes: EOMI, no conjunctival pallor or injection ENTM: Moist mucous membranes Cardiovascular: RRR, no murmur, 1+ pitting edema Respiratory: CTA BL, normal work of breathing Gastrointestinal: soft, nontender, nondistended MSK: moves 4 extremities equally Derm: no rashes appreciated Neuro: CN II-XII grossly intact Psych: AOx3, appropriate affect  Brief Hospital Course:  Patient was seen in clinic for dyspnea on exertion and assymetric swelling of right leg over left.  Has chronic orthopnea and pedal edema.  D-dimer was elevated and she was instructed to go to ED for DVT screen but did not immediately present.  When she did come DVT US was neg and due to contrast allergy she was scheduled for VQ scan.  Wells was 0-1 and she was not SOB at rest or tachycardic so there was low suspicion of PE and concern was leaning toward cardiac etiology so Cardiology was consulted.  VQ showed wedge on right and 20-79% chance of PE so patient was started on eliquis.  She has cardiology and pcp followup  Issues for Follow Up:  1. With dyspnea/diaphoresis on exertion and her hx of CAD, cardiology was consulted and recommended outpatient followup for a potential stress test in the future. 2. Patient was started on eliquis for PE, '10mg'$  for 7days with transition to '5mg'$  for the next 3 months.  She will need a refill of eliquis  after 1 month from pcp and re-evaluation at 3 months for consideration of continued anticoagulation if clinically appropriate.  Significant Procedures:   Significant Labs and Imaging:  Recent Labs  Lab 07/21/17 1513 07/26/17 0957 07/28/17 0529  WBC 5.7 5.0 5.6  HGB 13.2 14.3 12.9  HCT 40.6 44.5 39.7  PLT 237 213 196   Recent Labs  Lab 07/21/17 1513 07/26/17 0957 07/27/17 0657 07/28/17 0529  NA 143 140 141 138  K 4.6 4.1 4.3 4.0  CL 102 107 102 102  CO2 '24 24 29 28  '$ GLUCOSE 93 112* 121* 130*  BUN '20 19 20 '$ 25*  CREATININE 1.36* 1.19* 1.40* 1.40*  CALCIUM 9.4 9.0 9.1 8.8*  ALKPHOS  --  101  --   --   AST  --  24  --   --   ALT  --  21  --   --   ALBUMIN  --  4.2  --   --     Dg Chest 2 View  Result Date: 07/26/2017 CLINICAL DATA:  Patient with shortness of breath. EXAM: CHEST - 2 VIEW COMPARISON:  Chest radiograph 01/13/2016. FINDINGS: Anterior cervical spinal fusion hardware. Stable cardiac and mediastinal contours. No consolidative pulmonary opacities. No pleural effusion or pneumothorax. Unchanged scarring left mid lower lung peripherally. Thoracic spine degenerative changes. Cholecystectomy clips. IMPRESSION: No acute cardiopulmonary process. Electronically Signed   By: Lovey Newcomer M.D.   On: 07/26/2017 11:57   Nm Pulmonary Perf And Vent  Result Date: 07/27/2017 CLINICAL DATA:  Inpatient. Dyspnea on exertion and lower extremity swelling for 3 weeks. Recent elevation of D-dimer. EXAM: NUCLEAR MEDICINE  VENTILATION - PERFUSION LUNG SCAN TECHNIQUE: Ventilation images were obtained in multiple projections using inhaled aerosol Tc-33mDTPA. Perfusion images were obtained in multiple projections after intravenous injection of Tc-99mAA. RADIOPHARMACEUTICALS:  32.7 mCi of Tc-9972mPA aerosol inhalation and 4.2 mCi Tc99m24m IV COMPARISON:  Chest radiograph from one day prior. FINDINGS: Ventilation: No focal ventilation defect. Perfusion: There is a wedge-shaped moderate mismatched  perfusion defect in the superior segment right lower lobe. No additional perfusion defects. IMPRESSION: Intermediate probability for pulmonary embolism (20-79%) by PIOPED II criteria. Electronically Signed   By: JasoIlona Sorrel.   On: 07/27/2017 13:36    Results/Tests Pending at Time of Discharge:   Discharge Medications:  Allergies as of 07/28/2017      Reactions   Iodinated Diagnostic Agents Shortness Of Breath, Itching   States she had this reaction connected with a MRI of the cervical spine.   Shellfish Allergy Anaphylaxis, Other (See Comments)   All seafood   Statins Other (See Comments)   Arthralgias (severe) with atorvastatin, mild arthralgias with rosuvastatin but willing to continue taking it      Medication List    TAKE these medications   apixaban 5 MG Tabs tablet Commonly known as:  ELIQUIS Take 2 tabs('10mg'$ )twice per day for the first 6 days, then decrease dose to 1 tab('5mg'$ )twice per day for the next 3 months. See pcp for refill   apixaban Kit Commonly known as:  ELIQUIS 1 kit by Does not apply route once for 1 dose.   clopidogrel 75 MG tablet Commonly known as:  PLAVIX TAKE 1 TABLET BY MOUTH DAILY   doxepin 100 MG capsule Commonly known as:  SINEQUAN Take 2 capsules (200 mg total) by mouth at bedtime.   esomeprazole 20 MG capsule Commonly known as:  NEXIUM Take 1 capsule (20 mg total) by mouth daily.   furosemide 40 MG tablet Commonly known as:  LASIX Take 40 mg by mouth daily.   gabapentin 600 MG tablet Commonly known as:  NEURONTIN TAKE 1 TABLET BY MOUTH TWICE DAILY   lidocaine 5 % Commonly known as:  LIDODERM Place 2 patches onto the skin daily. Remove & Discard patch within 12 hours or as directed by MD   meclizine 25 MG tablet Commonly known as:  ANTIVERT Take 1 tablet (25 mg total) by mouth 2 (two) times daily as needed for dizziness.   ramipril 2.5 MG capsule Commonly known as:  ALTACE Do not take this till you go back to the Clinic and  they ask you to restart it. What changed:    how much to take  how to take this  when to take this  additional instructions   rosuvastatin 10 MG tablet Commonly known as:  CRESTOR Take 1 tablet (10 mg total) by mouth daily.   sertraline 50 MG tablet Commonly known as:  ZOLOFT Take 1 tablet (50 mg total) by mouth 2 (two) times daily.   tiZANidine 2 MG tablet Commonly known as:  ZANAFLEX Take 1 tablet (2 mg total) by mouth 3 (three) times daily.   zonisamide 100 MG capsule Commonly known as:  ZONEGRAN Take 1 capsule (100 mg total) by mouth 2 (two) times daily. What changed:  Another medication with the same name was changed. Make sure you understand how and when to take each.   zonisamide 25 MG capsule Commonly known as:  ZONEGRAN Take an extra 25 mg twice a day for 2 weeks, then take 50 mg twice a day for a  total of 150 mg twice a day. What changed:    how much to take  how to take this  when to take this  additional instructions       Discharge Instructions: Please refer to Patient Instructions section of EMR for full details.  Patient was counseled important signs and symptoms that should prompt return to medical care, changes in medications, dietary instructions, activity restrictions, and follow up appointments.   Follow-Up Appointments: Follow-up Information    Rogue Bussing, MD. Go on 07/30/2017.   Specialty:  Family Medicine Why:  8:30am Contact information: [place] [id number] [phone]        Skeet Latch, MD. Schedule an appointment as soon as possible for a visit on 08/18/2017.   Specialty:  Cardiology Why:  at 10:15 AM with her PA Rosaria Ferries Contact information: [address] [place] [address] [place], [place], [place] 07/28/2017, 8:35 AM PGY-1, [place]

## 2017-07-28 NOTE — Progress Notes (Signed)
Dc instructions given to patient at this time.  Pt verbalized understanding of all instructions.  Tele dc'd.  IV dc'd.  Eliquis coupon given.  Pt has no s/s of any acute distress.

## 2017-07-30 ENCOUNTER — Ambulatory Visit (INDEPENDENT_AMBULATORY_CARE_PROVIDER_SITE_OTHER): Payer: Medicare Other | Admitting: Internal Medicine

## 2017-07-30 ENCOUNTER — Encounter: Payer: Self-pay | Admitting: Internal Medicine

## 2017-07-30 VITALS — BP 131/80 | HR 81 | Temp 98.5°F | Ht 67.0 in | Wt 174.8 lb

## 2017-07-30 DIAGNOSIS — R0600 Dyspnea, unspecified: Secondary | ICD-10-CM

## 2017-07-30 DIAGNOSIS — R0609 Other forms of dyspnea: Secondary | ICD-10-CM

## 2017-07-30 DIAGNOSIS — Z23 Encounter for immunization: Secondary | ICD-10-CM | POA: Diagnosis not present

## 2017-07-30 DIAGNOSIS — R739 Hyperglycemia, unspecified: Secondary | ICD-10-CM

## 2017-07-30 LAB — POCT GLYCOSYLATED HEMOGLOBIN (HGB A1C): Hemoglobin A1C: 5.5 % (ref 4.0–5.6)

## 2017-07-30 NOTE — Patient Instructions (Signed)
Ms. Debenedetto,  I will call with the blood sugar test result.  If you have worsening shortness of breath or leg swelling, please see [place] back. Otherwise, please follow-up in about 1 month to ensure leg swelling has improved.  You received the pneumococcal vaccine today.  Best, Dr. Ola Spurr

## 2017-07-30 NOTE — Progress Notes (Signed)
[facility] Progress Note  Subjective:  [name] is a 67 y.o. female with history of TIA, migraines, renal insufficiency, and HTN who presents for hospital follow-up for suspected PE. She had presented to clinic 5/14 with increased shortness of breath and asymmetric leg swelling. She had elevated D-dimer and was recommended to go to ED but had delayed presentation until 5/19. She had negative DVT US and had VQ scan (performed instead of CTA chest due to contrast allergy) that showed right-sided wedge and 20-79% chance of PE. Cardiology was also consulted and recommended outpatient follow-up for stress test given dyspnea on exertion and equivocal PE study. She was started on eliquis with suspicion that she'd had right-sided DVT that migrated. No known trigger so may require prolonged treatment course.   Inpatient team reccommendations for follow-up:  1. With dyspnea/diaphoresis on exertion and her hx of CAD, cardiology was consulted and recommended outpatient followup for a potential stress test in the future. 2. Patient was started on eliquis for PE, 10mg  for 7days with transition to 5mg  for the next 3 months.  She will need a refill of eliquis after 1 month from pcp and re-evaluation at 3 months for consideration of continued anticoagulation if clinically appropriate.  Since leaving hospital, patient still having some dyspnea and notes little improvement of right leg swelling. ROS: No fevers, no cough.    Past Medical History:  Diagnosis Date  . Chronic kidney disease    stage 3  . GERD (gastroesophageal reflux disease)   . Hyperlipidemia   . Migraine headache   . Occipital neuralgia    Left  . Renal insufficiency   . Stroke [facility]) 10/2013      Allergies  Allergen Reactions  . Iodinated Diagnostic Agents Shortness Of Breath and Itching    States she had this reaction connected with a MRI of the cervical spine.  . Shellfish Allergy Anaphylaxis and Other (See  Comments)    All seafood  . Statins Other (See Comments)    Arthralgias (severe) with atorvastatin, mild arthralgias with rosuvastatin but willing to continue taking it    Social History   Tobacco Use  . Smoking status: Former Smoker    Packs/day: 0.30    Types: Cigarettes    Last attempt to quit: 11/01/2011    Years since quitting: 5.7  . Smokeless tobacco: Never Used  Substance Use Topics  . Alcohol use: No    Alcohol/week: 0.0 oz    Objective: Blood pressure 131/80, pulse 81, temperature 98.5 F (36.9 C), temperature source Oral, height 5\' 7"  (1.702 m), weight 174 lb 12.8 oz (79.3 kg), SpO2 97 %. Body mass index is 27.38 kg/m. Constitutional: Pleasant female in NAD Cardiovascular: RRR, S1, S2, no m/r/g.  Pulmonary/Chest: Effort normal and breath sounds normal.  Musculoskeletal: R calf measures 40 cm, L calf measures 38 cm Skin: Skin is warm and dry. No rash noted.  Psychiatric: Normal mood and affect.  Vitals reviewed  Assessment/Plan: Dyspnea - On eliquis for suspected PE.  - Already has cardiology follow-up for 08/18/17 for further work-up of dyspnea on exertion.  - Recommended follow-up in about 1 month to make sure right leg swelling has started to improve and for further eliquis refills.   Follow-up in about 1 month.  Olene Floss, MD [place], PGY-3

## 2017-08-02 ENCOUNTER — Encounter: Payer: Self-pay | Admitting: Internal Medicine

## 2017-08-02 NOTE — Assessment & Plan Note (Signed)
-   On eliquis for suspected PE.  - Already has cardiology follow-up for 08/18/17 for further work-up of dyspnea on exertion.  - Recommended follow-up in about 1 month to make sure right leg swelling has started to improve and for further eliquis refills.

## 2017-08-18 ENCOUNTER — Ambulatory Visit: Payer: Self-pay | Admitting: Physician Assistant

## 2017-08-24 ENCOUNTER — Ambulatory Visit (INDEPENDENT_AMBULATORY_CARE_PROVIDER_SITE_OTHER): Payer: Medicare Other | Admitting: Family Medicine

## 2017-08-24 DIAGNOSIS — I2699 Other pulmonary embolism without acute cor pulmonale: Secondary | ICD-10-CM | POA: Diagnosis not present

## 2017-08-24 DIAGNOSIS — M7989 Other specified soft tissue disorders: Secondary | ICD-10-CM | POA: Diagnosis not present

## 2017-08-24 MED ORDER — APIXABAN 5 MG PO TABS: ORAL_TABLET | ORAL | 0 refills | Status: DC

## 2017-08-24 NOTE — Assessment & Plan Note (Signed)
Diagnosed on recent hospitalization and started on Eliquis.  Good compliance with this.  No reported signs of bleeding. Will need to stay on anticoagulation for 3 months.   -refill provided

## 2017-08-24 NOTE — Assessment & Plan Note (Addendum)
Suspect 2/2 chronic pedal edema and venous stasis.  Taking Lasix 40 mg daily.  Advised increase to 60 mg daily (1.5 tabs) to see if improves.  No concern for DVT on exam and pt on anticoagulation with Eliquis for recent PE.  No CP, SOB. Physical exam with warm extremity and good pulses.   -f/u in 2-3 weeks; if resolves, can consider decrease to 40 mg dose -return precautions discussed

## 2017-08-24 NOTE — Patient Instructions (Addendum)
It was nice seeing you again today!  You were seen in clinic for continued leg swelling.  I do not suspect a clot in your leg at this time but as we discussed, it is possible this edema can be due to venous stasis.  I am increasing your Lasix to 60 mg daily to see if that helps.  Please follow-up in 2 weeks to be reevaluated.  If at that time your leg swelling has improved, we can cut back to 40 mg daily again.  If you have any new or worsening symptoms, please contact the office to be seen earlier.   Be well, Lovenia Kim MD

## 2017-08-24 NOTE — Progress Notes (Signed)
   Subjective:   Patient ID: [name]    DOB: Oct 15, 1950, 67 y.o. female   MRN: [id number]  CC: f/u PE   HPI: [name] is a 67 y.o. female who presents to clinic today for f/u PE and leg swelling.  Pulmonary embolism Pt following up for recent PE diagnosed on 5/19 and started on eliquis during her hospitalization.  She reports good compliance with this and denies any active bleeding.  She denies fever, chills, nausea, vomiting.  Still having some swelling in her lower legs R>L which seems to be chronic and she takes Lasix 40 mg daily.  She denies fever, cough, SOB, CP, palpitations.  No cold extremities, numbness or tingling reported.    No other concerns at this time.     ROS: Denies fever, chills, nausea, vomiting. No CP, SOB.   No numbness, tingling.    Social: pt is a former smoker, quit 2013 Medications reviewed. Objective:   BP 122/80   Pulse 88   Temp 98.5 F (36.9 C) (Oral)   Wt 179 lb 6.4 oz (81.4 kg)   SpO2 95%   BMI 28.10 kg/m  Vitals and nursing note reviewed.  General: pleasant 67 yo female, well appearing, NAD  HEENT: NCAT, EOMI Neck: supple, normal ROM  CV: RRR no MRG  Lungs: CTAB, normal effort  Abdomen: soft, NTND, +bs  Skin: warm, dry, no rash Extremities: warm and well perfused, normal tone, no calf tenderness or erythema, negative Homan's sign Neuro: alert, oriented x3, motor strength 5/5 bilaterally in upper and lower extremities, normal sensation   Assessment & Plan:   Pulmonary embolism (HCC) Diagnosed on recent hospitalization and started on Eliquis.  Good compliance with this.  No reported signs of bleeding. Will need to stay on anticoagulation for 3 months.   -refill provided   Leg swelling Suspect 2/2 chronic pedal edema and venous stasis.  Taking Lasix 40 mg daily.  Advised increase to 60 mg daily (1.5 tabs) to see if improves.  No concern for DVT on exam and pt on anticoagulation with Eliquis for recent PE.  No CP, SOB. Physical  exam with warm extremity and good pulses.   -f/u in 2-3 weeks; if resolves, can consider decrease to 40 mg dose -return precautions discussed  Meds ordered this encounter  Medications  . apixaban (ELIQUIS) 5 MG TABS tablet    Sig: Take 2 tabs(10mg )twice per day for the first 6 days, then decrease dose to 1 tab(5mg )twice per day for the next 3 months. See pcp for refill    Dispense:  84 tablet    Refill:  0   Lovenia Kim, MD [place], PGY-2 08/24/2017 2:04 PM

## 2017-08-31 ENCOUNTER — Encounter: Payer: Self-pay | Admitting: *Deleted

## 2017-08-31 ENCOUNTER — Encounter: Payer: Self-pay | Admitting: Physician Assistant

## 2017-08-31 ENCOUNTER — Ambulatory Visit (INDEPENDENT_AMBULATORY_CARE_PROVIDER_SITE_OTHER): Payer: Medicare Other | Admitting: Physician Assistant

## 2017-08-31 VITALS — BP 140/84 | HR 81 | Ht 67.0 in | Wt 176.4 lb

## 2017-08-31 DIAGNOSIS — E785 Hyperlipidemia, unspecified: Secondary | ICD-10-CM | POA: Diagnosis not present

## 2017-08-31 DIAGNOSIS — I2699 Other pulmonary embolism without acute cor pulmonale: Secondary | ICD-10-CM

## 2017-08-31 DIAGNOSIS — E8770 Fluid overload, unspecified: Secondary | ICD-10-CM | POA: Diagnosis not present

## 2017-08-31 DIAGNOSIS — R0609 Other forms of dyspnea: Secondary | ICD-10-CM | POA: Diagnosis not present

## 2017-08-31 DIAGNOSIS — I6523 Occlusion and stenosis of bilateral carotid arteries: Secondary | ICD-10-CM

## 2017-08-31 DIAGNOSIS — R0600 Dyspnea, unspecified: Secondary | ICD-10-CM

## 2017-08-31 DIAGNOSIS — I771 Stricture of artery: Secondary | ICD-10-CM

## 2017-08-31 NOTE — Patient Instructions (Addendum)
Medication Instructions:    TALK LASIX TWICE A DAY FOR TWO DAY ONLY THEN RESUME BACK TO ONCE DAILY  If you need a refill on your cardiac medications before your next appointment, please call your pharmacy.  Labwork: NONE ORDERED  TODAY    Testing/Procedures: Your physician has requested that you have a lexiscan myoview. For further information please visit [email or web address]. Please follow instruction sheet, as given.  Your physician has requested that you have a carotid duplex. This test is an ultrasound of the carotid arteries in your neck. It looks at blood flow through these arteries that supply the brain with blood. Allow one hour for this exam. There are no restrictions or special instructions.       Follow-Up: FIRST AVAILABLE [facility]    Any Other Special Instructions Will Be Listed Below (If Applicable).

## 2017-08-31 NOTE — Progress Notes (Signed)
Cardiology Office Note   Date:  08/31/2017   ID:  [name], DOB [birth year], MRN [id number]  PCP:  Lovenia Kim, MD  Cardiologist: Dr. Oval Linsey, 07/27/2017 in hospital Courtney Ferries, PA-C   Chief Complaint  Patient presents with  . Hospitalization Follow-up  . Foot Swelling  . Shortness of Breath    History of Present Illness: [name] is a 67 y.o. female with a history of CKD, GERD, HLD and hx of CVA in 2015, 50% stenosis left subclavian with coronary calcifications noted on CT 12/2015,  Admitted 5/19-5/21/2018 for dyspnea on exertion, diagnosed with PE, seen by cards for dyspnea on exertion, no clear risk factors for PE, suspicion for DVT migrating up to cause the PE although lower extremity Dopplers were negative, repeat carotid Dopplers as outpatient, consider PCSK9 for LDL 159 on rosuvastatin  [name] presents for cardiology follow up.   She still has SOB when exerting her self carrying groceries and walking up steps.   She was seen by PCP 06/17 and Lasix was increased from 40 mg>>60 mg qd.   The increased Lasix dose has not changed the swelling. She has LE edema, but also feels swollen all over.  She denies PND, sleeps on 2 pillows, no recent change.   She has had problems dropping things with her L arm, some weakness.  No numbness.  Her knees are swollen, she gets periodic injections for them. The swelling started after the last set of injections. She hopes to get the fluid drawn off tomorrow.   She does not get chest pain.   She had muscle cramps w/ rosuvastatin, willing to do PCSK-9   Past Medical History:  Diagnosis Date  . Chronic kidney disease    stage 3  . GERD (gastroesophageal reflux disease)   . Hyperlipidemia   . Migraine headache   . Occipital neuralgia    Left  . Renal insufficiency   . Stroke [facility]) 10/2013    Past Surgical History:  Procedure Laterality Date  . ABDOMINAL HYSTERECTOMY    . BILIARY STENT  PLACEMENT N/A 12/12/2015   Procedure: BILIARY STENT PLACEMENT;  Surgeon: Doran Stabler, MD;  Location: [place] [facility];  Service: Endoscopy;  Laterality: N/A;  . BLADDER REPAIR    . CARPAL TUNNEL RELEASE    . CERVICAL SPINE SURGERY    . CHOLECYSTECTOMY N/A 12/06/2015   Procedure: LAPAROSCOPIC CHOLECYSTECTOMY WITH  INTRAOPERATIVE CHOLANGIOGRAM;  Surgeon: Stark Klein, MD;  Location: [place];  Service: General;  Laterality: N/A;  . ELBOW SURGERY    . ERCP N/A 12/12/2015   Procedure: ENDOSCOPIC RETROGRADE CHOLANGIOPANCREATOGRAPHY (ERCP);  Surgeon: Doran Stabler, MD;  Location: [facility] ENDOSCOPY;  Service: Endoscopy;  Laterality: N/A;  . ESOPHAGOGASTRODUODENOSCOPY N/A 01/28/2016   Procedure: ESOPHAGOGASTRODUODENOSCOPY (EGD)  **Biliary STENT removal**;  Surgeon: Doran Stabler, MD;  Location: [facility] ENDOSCOPY;  Service: Gastroenterology;  Laterality: N/A;  . IR GENERIC HISTORICAL  12/17/2015   IR US GUIDE BX ASP/DRAIN 12/17/2015 [facility]  . IR GENERIC HISTORICAL  12/17/2015   IR GUIDED DRAIN W CATHETER PLACEMENT 12/17/2015 [facility]  . IR GENERIC HISTORICAL  12/17/2015   IR SINUS/FIST TUBE CHK-NON GI 12/17/2015 [facility]  . KNEE SURGERY    . LUNG SURGERY    . TEE WITHOUT CARDIOVERSION N/A 12/19/2015   Procedure: TRANSESOPHAGEAL ECHOCARDIOGRAM (TEE);  Surgeon: Josue Hector, MD;  Location: [facility] ENDOSCOPY;  Service: Cardiovascular;  Laterality: N/A;    Current Outpatient Medications  Medication Sig Dispense Refill  . apixaban (ELIQUIS) 5 MG TABS tablet Take 2 tabs(10mg )twice per day for the first 6 days, then decrease dose to 1 tab(5mg )twice per day for the next 3 months. See pcp for refill 84 tablet 0  . clopidogrel (PLAVIX) 75 MG tablet TAKE 1 TABLET BY MOUTH DAILY 30 tablet 5  . doxepin (SINEQUAN) 100 MG capsule Take 2 capsules (200 mg total) by mouth at bedtime. 180 capsule 1  . esomeprazole (NEXIUM) 20 MG capsule Take 1 capsule (20 mg total) by mouth daily. 30 capsule 5  . furosemide  (LASIX) 40 MG tablet Take 40 mg by mouth daily.    Marland Kitchen gabapentin (NEURONTIN) 600 MG tablet TAKE 1 TABLET BY MOUTH TWICE DAILY 180 tablet 1  . lidocaine (LIDODERM) 5 % Place 2 patches onto the skin daily. Remove & Discard patch within 12 hours or as directed by MD 60 patch 5  . meclizine (ANTIVERT) 25 MG tablet Take 1 tablet (25 mg total) by mouth 2 (two) times daily as needed for dizziness. 20 tablet 0  . ramipril (ALTACE) 2.5 MG capsule Take 2.5 mg by mouth daily.    . rosuvastatin (CRESTOR) 10 MG tablet Take 1 tablet (10 mg total) by mouth daily. 90 tablet 0  . sertraline (ZOLOFT) 50 MG tablet Take 1 tablet (50 mg total) by mouth 2 (two) times daily. 60 tablet 11  . tiZANidine (ZANAFLEX) 2 MG tablet Take 1 tablet (2 mg total) by mouth 3 (three) times daily. 270 tablet 3  . zonisamide (ZONEGRAN) 100 MG capsule Take 1 capsule (100 mg total) by mouth 2 (two) times daily. 60 capsule 11  . zonisamide (ZONEGRAN) 25 MG capsule Take an extra 25 mg twice a day for 2 weeks, then take 50 mg twice a day for a total of 150 mg twice a day. (Patient taking differently: Take 50 mg by mouth 2 (two) times daily. take 50 mg twice a day) 120 capsule 11   No current facility-administered medications for this visit.     Allergies:   Iodinated diagnostic agents; Shellfish allergy; and Statins    Social History:  The patient  reports that she quit smoking about 5 years ago. Her smoking use included cigarettes. She smoked 0.30 packs per day. She has never used smokeless tobacco. She reports that she does not drink alcohol or use drugs.   Family History:  The patient's family history includes Alzheimer's disease in her mother; Cancer in her brother; Cancer (age of onset: 61) in her mother; Hyperlipidemia in her brother; Lung cancer in her father; Prostate cancer in her father.    ROS:  Please see the history of present illness. All other systems are reviewed and negative.    PHYSICAL EXAM: VS:  BP 140/84   Pulse  81   Ht 5\' 7"  (1.702 m)   Wt 176 lb 6.4 oz (80 kg)   BMI 27.63 kg/m  , BMI Body mass index is 27.63 kg/m. GEN: Well nourished, well developed, female in no acute distress  HEENT: normal for age  Neck: no JVD, no carotid bruit, faint left subclavian bruit, no masses Cardiac: RRR; no murmur, no rubs, or gallops Respiratory:  clear to auscultation bilaterally, normal work of breathing GI: soft, nontender, nondistended, + BS MS: no deformity or atrophy; no distal edema; some edema noted in both knees, distal pulses are 2+ in all 4 extremities   Skin: warm and dry, no rash Neuro:  Strength and sensation are  intact Psych: euthymic mood, full affect   EKG:  EKG is not ordered today.   ECHO: 07/24/2017  - Left ventricle: The cavity size was normal. Systolic function was   normal. The estimated ejection fraction was in the range of 55%   to 60%. Wall motion was normal; there were no regional wall   motion abnormalities. Doppler parameters are consistent with   abnormal left ventricular relaxation (grade 1 diastolic   dysfunction).     Recent Labs: 07/21/2017: TSH 0.970 07/26/2017: ALT 21 07/28/2017: BUN 25; Creatinine, Ser 1.40; Hemoglobin 12.9; Platelets 196; Potassium 4.0; Sodium 138    Lipid Panel    Component Value Date/Time   CHOL 255 (H) 11/05/2016 0833   TRIG 129 11/05/2016 0833   HDL 55 11/05/2016 0833   CHOLHDL 4.6 (H) 11/05/2016 0833   CHOLHDL 2.2 07/13/2015 1056   VLDL 10 07/13/2015 1056   LDLCALC 174 (H) 11/05/2016 0833   LDLDIRECT 156 (H) 03/27/2017 0939     Wt Readings from Last 3 Encounters:  08/31/17 176 lb 6.4 oz (80 kg)  08/24/17 179 lb 6.4 oz (81.4 kg)  07/30/17 174 lb 12.8 oz (79.3 kg)     Other studies Reviewed: Additional studies/ records that were reviewed today include: Hospital records and testing  ASSESSMENT AND PLAN:  1.  PE: She has not missed any doses of her Eliquis.  No bleeding issues.  Continue this  2.  Dyspnea on exertion: Her  symptoms have not improved much, despite compliance with the Eliquis.  I am concerned that this is an anginal equivalent. -We will order a Myoview, Lexiscan as I am not sure she could hit a heart rate target on the treadmill.  3.  Volume overload: She had only grade 1 diastolic dysfunction on her echocardiogram. -  Symptoms improved after decreasing the amount of sodium in her diet. - However, she still feels that she is overloaded. - Increase the Lasix to 60 mg twice daily for 2 days, then back to her previous dose. - Start daily weights.  4.  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 - In 2015 the right ICA was 40-59% stenosed, left ICA 1-39%. - Recheck this with attention to the left subclavian artery stenosis  5.  Hyperlipidemia: - She is having problems tolerating Crestor 10 mg a day. -I explained that her LDL of 159 would not get to the target of less than 70 without additional medication. - She is agreeable to being referred to the lipid clinic for possible PCSK9 therapy   Current medicines are reviewed at length with the patient today.  The patient does not have concerns regarding medicines.  The following changes have been made:  no change  Labs/ tests ordered today include:  Orders Placed This Encounter  Procedures  . AMB Referral to [facility]  . MYOCARDIAL PERFUSION IMAGING     Disposition:   FU with Dr. Oval Linsey  Signed, Courtney Ferries, PA-C  08/31/2017 2:06 PM    [place] Phone: [phone]; Fax: [phone]  This note was written with the assistance of speech recognition software. Please excuse any transcriptional errors.

## 2017-09-01 ENCOUNTER — Telehealth: Payer: Self-pay | Admitting: Physician Assistant

## 2017-09-01 DIAGNOSIS — M170 Bilateral primary osteoarthritis of knee: Secondary | ICD-10-CM | POA: Diagnosis not present

## 2017-09-01 NOTE — Telephone Encounter (Signed)
Left message for patient to call back.  She needs an appointment in the lipid clinic with the pharmcist.

## 2017-09-11 ENCOUNTER — Ambulatory Visit: Payer: Medicare Other | Admitting: Family Medicine

## 2017-09-16 ENCOUNTER — Telehealth (HOSPITAL_COMMUNITY): Payer: Self-pay

## 2017-09-16 NOTE — Telephone Encounter (Signed)
Encounter complete. 

## 2017-09-18 ENCOUNTER — Ambulatory Visit (HOSPITAL_COMMUNITY)
Admission: RE | Admit: 2017-09-18 | Discharge: 2017-09-18 | Disposition: A | Discharge status: Home / Self Care | Payer: Medicare Other | Source: Ambulatory Visit | Attending: Cardiology | Admitting: Cardiology

## 2017-09-18 DIAGNOSIS — I771 Stricture of artery: Secondary | ICD-10-CM

## 2017-09-18 DIAGNOSIS — R0600 Dyspnea, unspecified: Secondary | ICD-10-CM

## 2017-09-18 DIAGNOSIS — R0609 Other forms of dyspnea: Secondary | ICD-10-CM | POA: Diagnosis not present

## 2017-09-18 LAB — MYOCARDIAL PERFUSION IMAGING
LV dias vol: 73 mL (ref 46–106)
LV sys vol: 25 mL
Peak HR: 85 {beats}/min
Rest HR: 65 {beats}/min
SDS: 2
SRS: 0
SSS: 2
TID: 1.15

## 2017-09-18 MED ORDER — TECHNETIUM TC 99M TETROFOSMIN IV KIT
31.4000 | PACK | Freq: Once | INTRAVENOUS | Status: AC | PRN
  Administered 2017-09-18: 31.4 via INTRAVENOUS
  Filled 2017-09-18: qty 32

## 2017-09-18 MED ORDER — TECHNETIUM TC 99M TETROFOSMIN IV KIT
10.4000 | PACK | Freq: Once | INTRAVENOUS | Status: AC | PRN
  Administered 2017-09-18: 10.4 via INTRAVENOUS
  Filled 2017-09-18: qty 11

## 2017-09-18 MED ORDER — REGADENOSON 0.4 MG/5ML IV SOLN
0.4000 mg | Freq: Once | INTRAVENOUS | Status: AC
  Administered 2017-09-18: 0.4 mg via INTRAVENOUS

## 2017-09-22 ENCOUNTER — Other Ambulatory Visit: Payer: Self-pay

## 2017-09-22 DIAGNOSIS — Z79899 Other long term (current) drug therapy: Secondary | ICD-10-CM

## 2017-09-22 DIAGNOSIS — E8770 Fluid overload, unspecified: Secondary | ICD-10-CM

## 2017-09-23 ENCOUNTER — Other Ambulatory Visit: Payer: Self-pay | Admitting: Family Medicine

## 2017-09-23 ENCOUNTER — Other Ambulatory Visit: Payer: Self-pay

## 2017-09-23 ENCOUNTER — Ambulatory Visit (INDEPENDENT_AMBULATORY_CARE_PROVIDER_SITE_OTHER): Payer: Medicare Other | Admitting: Family Medicine

## 2017-09-23 ENCOUNTER — Encounter: Payer: Self-pay | Admitting: Family Medicine

## 2017-09-23 ENCOUNTER — Ambulatory Visit: Payer: Self-pay

## 2017-09-23 VITALS — BP 128/78 | HR 82 | Temp 98.0°F | Ht 67.0 in | Wt 175.0 lb

## 2017-09-23 DIAGNOSIS — I6523 Occlusion and stenosis of bilateral carotid arteries: Secondary | ICD-10-CM

## 2017-09-23 DIAGNOSIS — M26621 Arthralgia of right temporomandibular joint: Secondary | ICD-10-CM

## 2017-09-23 MED ORDER — NAPROXEN 500 MG PO TABS: 500.0000 mg | ORAL_TABLET | Freq: Two times a day (BID) | ORAL | 0 refills | Status: DC

## 2017-09-23 MED ORDER — BACLOFEN 10 MG PO TABS: 10.0000 mg | ORAL_TABLET | Freq: Three times a day (TID) | ORAL | 0 refills | Status: DC

## 2017-09-23 MED ORDER — ROSUVASTATIN CALCIUM 10 MG PO TABS: 10.0000 mg | ORAL_TABLET | Freq: Every day | ORAL | 0 refills | Status: DC

## 2017-09-23 MED ORDER — CYCLOBENZAPRINE HCL 10 MG PO TABS: 10.0000 mg | ORAL_TABLET | Freq: Every day | ORAL | 0 refills | Status: DC

## 2017-09-23 NOTE — Patient Instructions (Signed)
It was great to meet you today! Thank you for letting me participate in your care!  Today, we discussed ear pain. 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 I have given you two medications that should help. Please take them as prescribed for two weeks and follow up for resolution. If the pain changes, gets worse, or you develop other concerning symptoms please return sooner.   I have also printed out some exercises that I would like you to do once your pain is under control. These should help prevent these occurences of pain in the future.  Be well, Harolyn Rutherford, DO PGY-2, [facility]

## 2017-09-23 NOTE — Progress Notes (Signed)
Subjective: Chief Complaint  Patient presents with  . right ear pain    started yesterday, hurts more when she moves her jaw     HPI: [name] is a 67 y.o. presenting to clinic today to discuss the following:  Right Ear Pain Patient states she started having ear pain yesterday around noon and it appeared suddenly while she was driving. She has never experienced anything like this before. No recent injury or impact to the right side of her head or neck. She describes the pain as shooting in her ear and it radiates to her lower jaw and has gradually gotten worse. She used her son's ear drops that provided little to no relief. She states the pain is constant and is rated a 8/10. Opening her mouth makes it worse but she is able to eat and drink without difficulty. She has not tried any other medications besides the ear drops. She denies dizziness, hearing changes, ringing in her ears.  She denies fevers, recent sickness, congestion, sore throat, nausea, or vomiting. She has had some diaphoresis.  Health Maintenance: None     ROS noted in HPI.   Past Medical, Surgical, Social, and Family History Reviewed & Updated per EMR.   Pertinent Historical Findings include:   Social History   Tobacco Use  Smoking Status Former Smoker  . Packs/day: 0.30  . Types: Cigarettes  . Last attempt to quit: 11/01/2011  . Years since quitting: 5.8  Smokeless Tobacco Never Used    Objective: Ht _0  (1.702 m)   Wt 175 lb (79.4 kg)   BMI 27.41 kg/m  Vitals and nursing notes reviewed  Physical Exam  Gen: Alert and Oriented x 3, NAD HEENT: Normocephalic, atraumatic, PERRLA, EOMI, TM visible with good light reflex, non-swollen, non-erythematous turbinates, non-erythematous pharyngeal mucosa, no exudates Neck: trachea midline, no thyroidmegaly, no LAD CV: RRR, no murmurs, normal S1, S2 split, +2 pulses dorsalis pedis bilaterally Resp: CTAB, no wheezing, rales, or rhonchi, comfortable  work of breathing MSK: FROM in all four extremities Ext: no clubbing, cyanosis, or edema Neuro: CN II-XII intact, no focal or gross deficits Skin: warm, dry, intact, no rashes  No results found for this or any previous visit (from the past 72 hour(s)).  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 Meniere's disease criteria not met as patient is not experiencing vertigo or tinnitus. Acute Otitis Media is very unlikely in adults outside the setting of an acute URI and she has no symptoms. Also, her ear exam was within normal limits effectively ruling out AOM.  It is difficult to discern if she has trigeminal neuralgia or TMD however in both cases you can treat with Baclofen. I also prescribed her Naproxen 547m BID for the next 7-10 days. Provided her with home exercises once pain control is achieved with medication. Instructed patient to return if no improvement in 7 days.   PATIENT EDUCATION PROVIDED: See AVS    Diagnosis and plan along with any newly prescribed medication(s) were discussed in detail with this patient today. The patient verbalized understanding and agreed with the plan. Patient advised if symptoms worsen return to clinic or ER.   Health Maintainance:   No orders of the defined types were placed in this encounter.   Meds ordered this encounter  Medications  . DISCONTD: cyclobenzaprine (FLEXERIL) 10 MG tablet  Sig: Take 1 tablet (10 mg total) by mouth at bedtime for 14 days.    Dispense:  14 tablet    Refill:  0  . DISCONTD: naproxen (NAPROSYN) 500 MG tablet    Sig: Take 1 tablet (500 mg total) by mouth 2 (two) times daily with a meal for 14 days.    Dispense:  28 tablet    Refill:  0  . DISCONTD: baclofen (LIORESAL) 10 MG tablet    Sig: Take 1 tablet (10 mg total) by mouth 3 (three) times  daily for 14 days.    Dispense:  42 tablet    Refill:  0     Tim Garlan Fillers, DO 09/23/2017, 11:33 AM PGY-2 [place]

## 2017-09-25 ENCOUNTER — Other Ambulatory Visit: Payer: Self-pay | Admitting: *Deleted

## 2017-09-25 DIAGNOSIS — Z79899 Other long term (current) drug therapy: Secondary | ICD-10-CM | POA: Diagnosis not present

## 2017-09-25 DIAGNOSIS — E8770 Fluid overload, unspecified: Secondary | ICD-10-CM | POA: Diagnosis not present

## 2017-09-26 LAB — BASIC METABOLIC PANEL
BUN/Creatinine Ratio: 15 (ref 12–28)
BUN: 21 mg/dL (ref 8–27)
CO2: 24 mmol/L (ref 20–29)
Calcium: 8.7 mg/dL (ref 8.7–10.3)
Chloride: 98 mmol/L (ref 96–106)
Creatinine, Ser: 1.41 mg/dL — ABNORMAL HIGH (ref 0.57–1.00)
GFR calc Af Amer: 45 mL/min/{1.73_m2} — ABNORMAL LOW (ref >59)
GFR calc non Af Amer: 39 mL/min/{1.73_m2} — ABNORMAL LOW (ref >59)
Glucose: 97 mg/dL (ref 65–99)
Potassium: 3.9 mmol/L (ref 3.5–5.2)
Sodium: 137 mmol/L (ref 134–144)

## 2017-09-28 DIAGNOSIS — M26621 Arthralgia of right temporomandibular joint: Secondary | ICD-10-CM | POA: Insufficient documentation

## 2017-09-28 NOTE — Assessment & Plan Note (Signed)
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 Meniere's disease criteria not met as patient is not experiencing vertigo or tinnitus. Acute Otitis Media is very unlikely in adults outside the setting of an acute URI and she has no symptoms. Also, her ear exam was within normal limits effectively ruling out AOM.  It is difficult to discern if she has trigeminal neuralgia or TMD however in both cases you can treat with Baclofen. I also prescribed her Naproxen 553m BID for the next 7-10 days. Provided her with home exercises once pain control is achieved with medication. Instructed patient to return if no improvement in 7 days.

## 2017-10-12 DIAGNOSIS — S8001XD Contusion of right knee, subsequent encounter: Secondary | ICD-10-CM | POA: Diagnosis not present

## 2017-10-12 DIAGNOSIS — M170 Bilateral primary osteoarthritis of knee: Secondary | ICD-10-CM | POA: Diagnosis not present

## 2017-10-15 ENCOUNTER — Ambulatory Visit: Payer: Self-pay | Admitting: Family Medicine

## 2017-10-26 ENCOUNTER — Encounter: Payer: Self-pay | Admitting: Family Medicine

## 2017-10-26 ENCOUNTER — Other Ambulatory Visit: Payer: Self-pay

## 2017-10-26 ENCOUNTER — Ambulatory Visit (INDEPENDENT_AMBULATORY_CARE_PROVIDER_SITE_OTHER): Payer: Medicare Other | Admitting: Family Medicine

## 2017-10-26 DIAGNOSIS — I6523 Occlusion and stenosis of bilateral carotid arteries: Secondary | ICD-10-CM | POA: Diagnosis not present

## 2017-10-26 DIAGNOSIS — I2699 Other pulmonary embolism without acute cor pulmonale: Secondary | ICD-10-CM

## 2017-10-26 DIAGNOSIS — S8000XA Contusion of unspecified knee, initial encounter: Secondary | ICD-10-CM

## 2017-10-26 NOTE — Progress Notes (Signed)
   Subjective:   Patient ID: [name]    DOB: [birth year], 67 y.o. female   MRN: [id number]  CC: follow up patellar fracture, recent PE  HPI: [name] is a 67 y.o. female who presents to clinic today for the following issues.  History of recent PE Recently sent to ED for concern for DVT, found to have PE.  Advised Eliquis x3 months which she has completed.  Follows with Cardiology at [facility], Dr. Skeet Latch.  Taking aspirin plavix for history of stroke.  She is due for repeat carotid Dopplers as an outpatient. Denies CP, SOB, palpitations, leg swelling.  She endorses some SOB which has been ongoing.  Stable on RA with 96%.    Right knee patellar contusion Slipped in water and fell climbing some brick stairs 2 weeks ago, fractured R patella. Was told she has may possibly have a small hairline fracture, no surgery required, and advised to wear a knee brace until her follow up which she has been doing.   Saw Dr. Ricki Rodriguez for this and she has an appt this Friday for follow-up. She has been icing it to help with inflammation, has not needed any pain medications.  Feels as though the area is healing well.  No fever, chills, nausea, vomiting.  Denies numbness, tingling, weakness.   ROS: No fever, chills, nausea, vomiting.  No abdominal pain, diarrhea.  No CP, SOB.   Social: she is a former smoker, quit 10 years ago Medications reviewed. Objective:   BP 130/88   Pulse 84   Temp 98.5 F (36.9 C) (Oral)   Wt 177 lb (80.3 kg)   SpO2 96%   BMI 27.72 kg/m  Vitals and nursing note reviewed.  General: 67 yo female, NAD  Neck: supple CV: RRR no MRG, 2+ pedal pulses Lungs: CTAB, normal effort on RA  Abdomen: soft, +bs  Skin: warm, dry, no rash Extremities: warm and well perfused MSK: R knee: well healing laceration on lateral aspect, no obvious deformity, ROM slightly decreased 2/2 discomfort  Neuro: alert, oriented x3, no focal deficits   Assessment & Plan:   Patellar  contusion, initial encounter Well healing laceration on lateral aspect of knee.  Does not appear to have any obvious fracture on imaging however she was told she may possibly have a small hairline fracture.  Pain is resolving.  Following up Friday with [facility] to ensure good recovery.  No red flags on exam.  Continue icing and antiinflammatories.    Pulmonary embolism ([place]) Has completed anticoagulation therapy.  Normal exam today.  Continue to monitor.   Lovenia Kim, MD [place], PGY-3 11/07/2017 7:35 PM

## 2017-10-28 ENCOUNTER — Other Ambulatory Visit: Payer: Self-pay | Admitting: Family Medicine

## 2017-11-04 ENCOUNTER — Ambulatory Visit: Payer: Medicare Other | Admitting: Cardiovascular Disease

## 2017-11-05 DIAGNOSIS — M170 Bilateral primary osteoarthritis of knee: Secondary | ICD-10-CM | POA: Diagnosis not present

## 2017-11-07 DIAGNOSIS — S8000XA Contusion of unspecified knee, initial encounter: Secondary | ICD-10-CM | POA: Insufficient documentation

## 2017-11-07 NOTE — Assessment & Plan Note (Signed)
Well healing laceration on lateral aspect of knee.  Does not appear to have any obvious fracture on imaging however she was told she may possibly have a small hairline fracture.  Pain is resolving.  Following up Friday with [facility] to ensure good recovery.  No red flags on exam.  Continue icing and antiinflammatories.

## 2017-11-07 NOTE — Assessment & Plan Note (Signed)
Has completed anticoagulation therapy.  Normal exam today.  Continue to monitor.

## 2017-11-20 ENCOUNTER — Encounter: Payer: Self-pay | Admitting: Cardiovascular Disease

## 2017-11-20 ENCOUNTER — Ambulatory Visit (INDEPENDENT_AMBULATORY_CARE_PROVIDER_SITE_OTHER): Payer: Medicare Other | Admitting: Cardiovascular Disease

## 2017-11-20 VITALS — BP 138/88 | HR 72 | Ht 67.0 in | Wt 171.0 lb

## 2017-11-20 DIAGNOSIS — R0609 Other forms of dyspnea: Secondary | ICD-10-CM

## 2017-11-20 DIAGNOSIS — I6523 Occlusion and stenosis of bilateral carotid arteries: Secondary | ICD-10-CM | POA: Diagnosis not present

## 2017-11-20 DIAGNOSIS — E785 Hyperlipidemia, unspecified: Secondary | ICD-10-CM | POA: Diagnosis not present

## 2017-11-20 DIAGNOSIS — R0600 Dyspnea, unspecified: Secondary | ICD-10-CM

## 2017-11-20 DIAGNOSIS — Z79899 Other long term (current) drug therapy: Secondary | ICD-10-CM

## 2017-11-20 MED ORDER — RAMIPRIL 5 MG PO CAPS
5.0000 mg | ORAL_CAPSULE | Freq: Every day | ORAL | 1 refills | Status: DC
Start: 2017-11-20 — End: 2018-04-21

## 2017-11-20 NOTE — Patient Instructions (Addendum)
Medication Instructions:  INCREASE YOUR RAMIPRIL TO 5 MG DAILY   Labwork: FASTING LP/CMET WHEN YOU RETURN TO SEE PHARMACIST   Testing/Procedures: NONE  Follow-Up: Your physician recommends that you schedule a follow-up appointment in: 1-2 WEEKS WITH PHARM D FOR BLOOD PRESSURE AND CHOLESTEROL (AM APPOINTMENT)   Your physician wants you to follow-up in: 4 MONTHS  You will receive a reminder letter in the mail two months in advance. If you don't receive a letter, please call our office to schedule the follow-up appointment.  If you need a refill on your cardiac medications before your next appointment, please call your pharmacy.

## 2017-11-20 NOTE — Progress Notes (Signed)
Cardiology Office Note   Date:  11/26/2017   ID:  [name], DOB 13-Oct-1950, MRN [id number]  PCP:  Lovenia Kim, MD  Cardiologist:   Skeet Latch, MD   No chief complaint on file.    History of Present Illness: [name] is a 67 y.o. female with asymptomatic coronary calcification, L subclavian stenosis, hypertension, hyperlipidemia, CKD 3, and stroke here for follow-up.  She was admitted 07/2017 with shortness of breath.  LE Doppler was negative for DVT.  V/Q scan showed a wedge shaped perfusion defect but was deemed intermediate probability.  There was no evidence of RV strain on echo.  He was started on Eliquis for anticoagulation.  She has some lower extremity edema that improves somewhat when she takes lasix or when she elevates them.  She still has shortness of brewath that she states has been present since her stroke.  It is worse when she goes up and down steps or when she gets in the shower.  She tries to walk for 25 minutes daily.  She followed up with Rosaria Ferries, PA-C on 08/2017 and was referred for [facility] to rule out ischemia.  Lexiscan Myoview 09/2017 revealed LVEF 66% and no ischemia.  [name] previously smoked for 25-30 years.  She also had a lung puncture injury that required repeat thoracentesis and drain placement.  Her BP at home has been running around 130/80.  She has not tolerated several statins in the past.   Past Medical History:  Diagnosis Date  . Chronic kidney disease    stage 3  . GERD (gastroesophageal reflux disease)   . Hyperlipidemia   . Migraine headache   . Occipital neuralgia    Left  . Renal insufficiency   . Stroke [facility] 10/2013     Current Outpatient Medications  Medication Sig Dispense Refill  . clopidogrel (PLAVIX) 75 MG tablet TAKE 1 TABLET BY MOUTH DAILY 30 tablet 5  . doxepin (SINEQUAN) 100 MG capsule Take 2 capsules (200 mg total) by mouth at bedtime. 180 capsule 1  . esomeprazole (NEXIUM) 20 MG  capsule Take 1 capsule (20 mg total) by mouth daily. 30 capsule 5  . furosemide (LASIX) 40 MG tablet Take 40 mg by mouth daily.    Marland Kitchen gabapentin (NEURONTIN) 600 MG tablet TAKE 1 TABLET BY MOUTH TWICE DAILY 180 tablet 1  . lidocaine (LIDODERM) 5 % Place 2 patches onto the skin daily. Remove & Discard patch within 12 hours or as directed by MD 60 patch 5  . meclizine (ANTIVERT) 25 MG tablet Take 1 tablet (25 mg total) by mouth 2 (two) times daily as needed for dizziness. 20 tablet 0  . naproxen (NAPROSYN) 500 MG tablet TAKE 1 TABLET(500 MG) BY MOUTH TWICE DAILY WITH A MEAL FOR 14 DAYS 180 tablet 0  . ramipril (ALTACE) 5 MG capsule Take 1 capsule (5 mg total) by mouth daily. 90 capsule 1  . rosuvastatin (CRESTOR) 10 MG tablet Take 1 tablet (10 mg total) by mouth daily. 90 tablet 0  . sertraline (ZOLOFT) 50 MG tablet Take 1 tablet (50 mg total) by mouth 2 (two) times daily. 60 tablet 11  . tiZANidine (ZANAFLEX) 2 MG tablet Take 1 tablet (2 mg total) by mouth 3 (three) times daily. 270 tablet 3  . zonisamide (ZONEGRAN) 100 MG capsule Take 1 capsule (100 mg total) by mouth 2 (two) times daily. 60 capsule 11  . zonisamide (ZONEGRAN) 25 MG capsule Take an extra 25  mg twice a day for 2 weeks, then take 50 mg twice a day for a total of 150 mg twice a day. (Patient taking differently: Take 50 mg by mouth 2 (two) times daily. take 50 mg twice a day) 120 capsule 11   No current facility-administered medications for this visit.     Allergies:   Iodinated diagnostic agents; Shellfish allergy; and Statins    Social History:  The patient  reports that she quit smoking about 6 years ago. Her smoking use included cigarettes. She smoked 0.30 packs per day. She has never used smokeless tobacco. She reports that she does not drink alcohol or use drugs.   Family History:  The patient's family history includes Alzheimer's disease in her mother; Cancer in her brother; Cancer (age of onset: 31) in her mother;  Hyperlipidemia in her brother; Lung cancer in her father; Prostate cancer in her father.    ROS:  Please see the history of present illness.   Otherwise, review of systems are positive for none.   All other systems are reviewed and negative.    PHYSICAL EXAM: VS:  BP 138/88   Pulse 72   Ht 5\' 7"  (1.702 m)   Wt 171 lb (77.6 kg)   BMI 26.78 kg/m  , BMI Body mass index is 26.78 kg/m. GENERAL:  Well appearing HEENT:  Pupils equal round and reactive, fundi not visualized, oral mucosa unremarkable NECK:  No jugular venous distention, waveform within normal limits, carotid upstroke brisk and symmetric, no bruits LUNGS:  Clear to auscultation bilaterally HEART:  RRR.  PMI not displaced or sustained,S1 and S2 within normal limits, no S3, no S4, no clicks, no rubs, no murmurs ABD:  Flat, positive bowel sounds normal in frequency in pitch, no bruits, no rebound, no guarding, no midline pulsatile mass, no hepatomegaly, no splenomegaly EXT:  2 plus pulses throughout, trace edema, no cyanosis no clubbing SKIN:  No rashes no nodules NEURO:  Cranial nerves II through XII grossly intact, motor grossly intact throughout PSYCH:  Cognitively intact, oriented to person place and time   EKG:  EKG is not ordered today.   Echo 07/24/17: Study Conclusions  - Left ventricle: The cavity size was normal. Systolic function was   normal. The estimated ejection fraction was in the range of 55%   to 60%. Wall motion was normal; there were no regional wall   motion abnormalities. Doppler parameters are consistent with   abnormal left ventricular relaxation (grade 1 diastolic   dysfunction).  Lexiscan Myoview 09/18/17:   The left ventricular ejection fraction is hyperdynamic (>65%).  Nuclear stress EF: 66%.  There was no ST segment deviation noted during stress.  The study is normal.  This is a low risk study.   Low risk stress nuclear study with normal perfusion and normal left ventricular regional  and global systolic function.    Recent Labs: 07/21/2017: TSH 0.970 07/26/2017: ALT 21 07/28/2017: Hemoglobin 12.9; Platelets 196 09/25/2017: BUN 21; Creatinine, Ser 1.41; Potassium 3.9; Sodium 137    Lipid Panel    Component Value Date/Time   CHOL 255 (H) 11/05/2016 0833   TRIG 129 11/05/2016 0833   HDL 55 11/05/2016 0833   CHOLHDL 4.6 (H) 11/05/2016 0833   CHOLHDL 2.2 07/13/2015 1056   VLDL 10 07/13/2015 1056   LDLCALC 174 (H) 11/05/2016 0833   LDLDIRECT 156 (H) 03/27/2017 0939     07/30/2017: Hemoglobin A1c 5.5% Hemoglobin 12.9, platelets 196 09/25/2017: Potassium 3.9, creatinine 1.4  Wt  Readings from Last 3 Encounters:  11/20/17 171 lb (77.6 kg)  10/26/17 177 lb (80.3 kg)  09/23/17 175 lb (79.4 kg)      ASSESSMENT AND PLAN:  # Prior stroke: # Carotid stenosis: Continue clopidogrel.  Will stop rosuvastatin 2/2 myalgias and start PCSK9 inhibitor given her myalgias on rosuvastatin.   # Shortness of breath: Likely lung related.  Symptoms haven't improved after treatment for her PE.  She has been short of breath since her TIA in 2015.  She has 25-30 pack year history of smoking and prior lung injury requiring thoracentesis and drain placement.  Lexiscan Myoview was negative for ischemia.  Only grade one diastolic dysfunction on echo.   # Prior PE: She completed 6 months ofEliquis.  # Hypertension: BP is above her goal of <130/80.  Increase Ramipril to 5mg .   Current medicines are reviewed at length with the patient today.  The patient does not have concerns regarding medicines.  The following changes have been made:  Increase ramipril  Labs/ tests ordered today include:   Orders Placed This Encounter  Procedures  . Lipid panel  . Comprehensive metabolic panel     Disposition:   FU with Jamonta Goerner C. Oval Linsey, MD, [facility] in 4 months.  PharmD in 1-2 weeks.    Signed, Caitlan Chauca C. Oval Linsey, MD, [facility]  11/26/2017 10:22 PM    [facility]

## 2017-11-26 ENCOUNTER — Encounter: Payer: Self-pay | Admitting: Cardiovascular Disease

## 2017-11-27 DIAGNOSIS — M170 Bilateral primary osteoarthritis of knee: Secondary | ICD-10-CM | POA: Diagnosis not present

## 2017-12-03 ENCOUNTER — Other Ambulatory Visit: Payer: Self-pay

## 2017-12-03 MED ORDER — SERTRALINE HCL 50 MG PO TABS: 50.0000 mg | ORAL_TABLET | Freq: Two times a day (BID) | ORAL | 3 refills | Status: DC

## 2017-12-09 ENCOUNTER — Ambulatory Visit (INDEPENDENT_AMBULATORY_CARE_PROVIDER_SITE_OTHER): Payer: Medicare Other | Admitting: Pharmacist

## 2017-12-09 ENCOUNTER — Encounter: Payer: Self-pay | Admitting: Pharmacist

## 2017-12-09 VITALS — BP 112/74

## 2017-12-09 DIAGNOSIS — E785 Hyperlipidemia, unspecified: Secondary | ICD-10-CM

## 2017-12-09 DIAGNOSIS — I10 Essential (primary) hypertension: Secondary | ICD-10-CM

## 2017-12-09 DIAGNOSIS — D62 Acute posthemorrhagic anemia: Secondary | ICD-10-CM | POA: Diagnosis not present

## 2017-12-09 DIAGNOSIS — F329 Major depressive disorder, single episode, unspecified: Secondary | ICD-10-CM | POA: Diagnosis not present

## 2017-12-09 DIAGNOSIS — Z9049 Acquired absence of other specified parts of digestive tract: Secondary | ICD-10-CM | POA: Diagnosis not present

## 2017-12-09 DIAGNOSIS — I6523 Occlusion and stenosis of bilateral carotid arteries: Secondary | ICD-10-CM | POA: Diagnosis not present

## 2017-12-09 DIAGNOSIS — N281 Cyst of kidney, acquired: Secondary | ICD-10-CM | POA: Diagnosis not present

## 2017-12-09 DIAGNOSIS — R7309 Other abnormal glucose: Secondary | ICD-10-CM | POA: Diagnosis not present

## 2017-12-09 DIAGNOSIS — N179 Acute kidney failure, unspecified: Secondary | ICD-10-CM | POA: Diagnosis not present

## 2017-12-09 DIAGNOSIS — G459 Transient cerebral ischemic attack, unspecified: Secondary | ICD-10-CM | POA: Diagnosis not present

## 2017-12-09 DIAGNOSIS — I129 Hypertensive chronic kidney disease with stage 1 through stage 4 chronic kidney disease, or unspecified chronic kidney disease: Secondary | ICD-10-CM | POA: Diagnosis not present

## 2017-12-09 DIAGNOSIS — R3129 Other microscopic hematuria: Secondary | ICD-10-CM | POA: Diagnosis not present

## 2017-12-09 DIAGNOSIS — Z8619 Personal history of other infectious and parasitic diseases: Secondary | ICD-10-CM | POA: Diagnosis not present

## 2017-12-09 DIAGNOSIS — F17201 Nicotine dependence, unspecified, in remission: Secondary | ICD-10-CM | POA: Diagnosis not present

## 2017-12-09 DIAGNOSIS — N182 Chronic kidney disease, stage 2 (mild): Secondary | ICD-10-CM | POA: Diagnosis not present

## 2017-12-09 DIAGNOSIS — Z79899 Other long term (current) drug therapy: Secondary | ICD-10-CM | POA: Diagnosis not present

## 2017-12-09 LAB — COMPREHENSIVE METABOLIC PANEL
ALT: 25 IU/L (ref 0–32)
AST: 26 IU/L (ref 0–40)
Albumin/Globulin Ratio: 2.4 — ABNORMAL HIGH (ref 1.2–2.2)
Albumin: 4.3 g/dL (ref 3.6–4.8)
Alkaline Phosphatase: 94 IU/L (ref 39–117)
BUN/Creatinine Ratio: 19 (ref 12–28)
BUN: 26 mg/dL (ref 8–27)
Bilirubin Total: 0.3 mg/dL (ref 0.0–1.2)
CO2: 26 mmol/L (ref 20–29)
Calcium: 9.1 mg/dL (ref 8.7–10.3)
Chloride: 101 mmol/L (ref 96–106)
Creatinine, Ser: 1.4 mg/dL — ABNORMAL HIGH (ref 0.57–1.00)
GFR calc Af Amer: 45 mL/min/{1.73_m2} — ABNORMAL LOW (ref >59)
GFR calc non Af Amer: 39 mL/min/{1.73_m2} — ABNORMAL LOW (ref >59)
Globulin, Total: 1.8 g/dL (ref 1.5–4.5)
Glucose: 101 mg/dL — ABNORMAL HIGH (ref 65–99)
Potassium: 4.5 mmol/L (ref 3.5–5.2)
Sodium: 141 mmol/L (ref 134–144)
Total Protein: 6.1 g/dL (ref 6.0–8.5)

## 2017-12-09 LAB — LIPID PANEL
Chol/HDL Ratio: 2.6 ratio (ref 0.0–4.4)
Cholesterol, Total: 156 mg/dL (ref 100–199)
HDL: 59 mg/dL (ref >39)
LDL Calculated: 78 mg/dL (ref 0–99)
Triglycerides: 96 mg/dL (ref 0–149)
VLDL Cholesterol Cal: 19 mg/dL (ref 5–40)

## 2017-12-09 MED ORDER — ROSUVASTATIN CALCIUM 10 MG PO TABS: ORAL_TABLET | ORAL | 0 refills | Status: DC

## 2017-12-09 NOTE — Progress Notes (Signed)
Patient ID: [name]                 DOB: 09-Nov-1950                      MRN: [id number]     HPI: [name] is a 67 y.o. female referred by Dr. Oval Linsey to pharmacist clinic for HTN and lipid management. PMH includes CKD-3, hyperlipidemia, renal stenosis, stroke, and hypertension. Patient present to clinic for HTN monitoring and potential PCSK9i initiation.   Current HTN meds:  ramipril 5mg  daily furosemide 40mg  daily  Current lipid meds: Rosuvastatin 10mg  daily  Intolerance: Atorvastatin 40mg  (01/04/2015) - severe joint and muscle pain Atorvastatin 10mg  (03/27/2017) - joint pain and muscle pain Rosuvastatin 10mg  (04/02/2017) - joint pain Ezetimibe 10mg  daily (03/17/2017) - unable afford  BP goal: <130/80  LDL goal: 70mg /dL  Family History: family history includes Alzheimer's disease in her mother; Cancer in her brother; Cancer (age of onset: 1) in her mother; Hyperlipidemia in her brother; Lung cancer in her father; Prostate cancer in her father.  Social History: patient  reports that she quit smoking about 6 years ago. Her smoking use included cigarettes. She smoked 0.30 packs per day. She has never used smokeless tobacco. She reports that she does not drink alcohol or use drugs.   Diet: eat fruits and salads, low in carbs and simple sugars  Exercise: daily exercise (walks)  Home BP readings: none available  Relevant LABS: 09/25/2017: Scr = 1.41; Na 137; K 3.9; Cl 98 03/27/2017: LDL-d = 156 (atorvastatin 10mg ) 11/05/2016: CHO 255; TG 129; HDL 55; LDL-c 174  Wt Readings from Last 3 Encounters:  11/20/17 171 lb (77.6 kg)  10/26/17 177 lb (80.3 kg)  09/23/17 175 lb (79.4 kg)   BP Readings from Last 3 Encounters:  12/09/17 112/74  11/20/17 138/88  10/26/17 130/88   Pulse Readings from Last 3 Encounters:  11/20/17 72  10/26/17 84  09/23/17 82    Past Medical History:  Diagnosis Date  . Chronic kidney disease    stage 3  . GERD (gastroesophageal reflux  disease)   . Hyperlipidemia   . Migraine headache   . Occipital neuralgia    Left  . Renal insufficiency   . Stroke [facility]) 10/2013    Current Outpatient Medications on File Prior to Visit  Medication Sig Dispense Refill  . clopidogrel (PLAVIX) 75 MG tablet TAKE 1 TABLET BY MOUTH DAILY 30 tablet 5  . doxepin (SINEQUAN) 100 MG capsule Take 2 capsules (200 mg total) by mouth at bedtime. 180 capsule 1  . esomeprazole (NEXIUM) 20 MG capsule Take 1 capsule (20 mg total) by mouth daily. 30 capsule 5  . furosemide (LASIX) 40 MG tablet Take 40 mg by mouth daily.    Marland Kitchen gabapentin (NEURONTIN) 600 MG tablet TAKE 1 TABLET BY MOUTH TWICE DAILY 180 tablet 1  . lidocaine (LIDODERM) 5 % Place 2 patches onto the skin daily. Remove & Discard patch within 12 hours or as directed by MD 60 patch 5  . meclizine (ANTIVERT) 25 MG tablet Take 1 tablet (25 mg total) by mouth 2 (two) times daily as needed for dizziness. 20 tablet 0  . naproxen (NAPROSYN) 500 MG tablet TAKE 1 TABLET(500 MG) BY MOUTH TWICE DAILY WITH A MEAL FOR 14 DAYS 180 tablet 0  . ramipril (ALTACE) 5 MG capsule Take 1 capsule (5 mg total) by mouth daily. 90 capsule 1  . sertraline (ZOLOFT) 50  MG tablet Take 1 tablet (50 mg total) by mouth 2 (two) times daily. 180 tablet 3  . tiZANidine (ZANAFLEX) 2 MG tablet Take 1 tablet (2 mg total) by mouth 3 (three) times daily. 270 tablet 3  . zonisamide (ZONEGRAN) 100 MG capsule Take 1 capsule (100 mg total) by mouth 2 (two) times daily. 60 capsule 11  . zonisamide (ZONEGRAN) 25 MG capsule Take an extra 25 mg twice a day for 2 weeks, then take 50 mg twice a day for a total of 150 mg twice a day. (Patient taking differently: Take 50 mg by mouth 2 (two) times daily. take 50 mg twice a day) 120 capsule 11   No current facility-administered medications on file prior to visit.     Allergies  Allergen Reactions  . Iodinated Diagnostic Agents Shortness Of Breath and Itching    States she had this reaction  connected with a MRI of the cervical spine.  . Shellfish Allergy Anaphylaxis and Other (See Comments)    All seafood  . Statins Other (See Comments)    Arthralgias (severe) with atorvastatin, mild arthralgias with rosuvastatin but willing to continue taking it    Blood pressure 112/74.  HTN (hypertension) Blood pressure well controlled today after ramipril dose changed 2 weeks ago. Patient denies problems with current therapy and reports compliance. She still working on positive lifestyle changes including low sodium diet, increase physical activity and weight loss.  Hyperlipidemia LDL goal <70 LDL remains above goal for secondary prevention. Patient continues to take rosuvastatin 10mg  daily but reports joint pain and leg cramps. She is currently on low fat, low carbs diet and daily exercise. Will repeat lipid panel today and initiate paperwork for PCSK9i and follow up with patient as needed.   Repatha/Praleunt indication, common side effects, administration, prior-authorization process, and patient assistance programs discussed during this visit as well.  Longino Trefz Rodriguez-Guzman PharmD, BCPS, [place] [address] [place] [address] 12/09/2017 5:42 PM

## 2017-12-09 NOTE — Patient Instructions (Addendum)
Return for a  follow up appointment as needed  Check your blood pressure at home daily (if able) and keep record of the readings.  Take your BP meds as follows: *No medication changes*  Bring all of your meds, your BP cuff and your record of home blood pressures to your next appointment.  Exercise as you're able, try to walk approximately 30 minutes per day.  Keep salt intake to a minimum, especially watch canned and prepared boxed foods.  Eat more fresh fruits and vegetables and fewer canned items.  Avoid eating in fast food restaurants.    HOW TO TAKE YOUR BLOOD PRESSURE: . Rest 5 minutes before taking your blood pressure. .  Don't smoke or drink caffeinated beverages for at least 30 minutes before. . Take your blood pressure before (not after) you eat. . Sit comfortably with your back supported and both feet on the floor (don't cross your legs). . Elevate your arm to heart level on a table or a desk. . Use the proper sized cuff. It should fit smoothly and snugly around your bare upper arm. There should be enough room to slip a fingertip under the cuff. The bottom edge of the cuff should be 1 inch above the crease of the elbow. . Ideally, take 3 measurements at one sitting and record the average.   Lipid Clinic Pharmacist (Chani Ghanem/Kristin)  Decrease rosuvastatin to 5mg  (1/2 tablets ) every Monday, Wednesday and Friday *START paperwork for Repatha 140mg *    Cholesterol Cholesterol is a fat. Your body needs a small amount of cholesterol. Cholesterol (plaque) may build up in your blood vessels (arteries). That makes you more likely to have a heart attack or stroke. You cannot feel your cholesterol level. Having a blood test is the only way to find out if your level is high. Keep your test results. Work with your doctor to keep your cholesterol at a good level. What do the results mean?  Total cholesterol is how much cholesterol is in your blood.  LDL is bad cholesterol. This is the  type that can build up. Try to have low LDL.  HDL is good cholesterol. It cleans your blood vessels and carries LDL away. Try to have high HDL.  Triglycerides are fat that the body can store or burn for energy. What are good levels of cholesterol?  Total cholesterol below 200.  LDL below 100 is good for people who have health risks. LDL below 70 is good for people who have very high risks.  HDL above 40 is good. It is best to have HDL of 60 or higher.  Triglycerides below 150. How can I lower my cholesterol? Diet Follow your diet program as told by your doctor.  Choose fish, white meat chicken, or [place] that is roasted or baked. Try not to eat red meat, fried foods, sausage, or lunch meats.  Eat lots of fresh fruits and vegetables.  Choose whole grains, beans, pasta, potatoes, and cereals.  Choose olive oil, corn oil, or canola oil. Only use small amounts.  Try not to eat butter, mayonnaise, shortening, or palm kernel oils.  Try not to eat foods with trans fats.  Choose low-fat or nonfat dairy foods. ? Drink skim or nonfat milk. ? Eat low-fat or nonfat yogurt and cheeses. ? Try not to drink whole milk or cream. ? Try not to eat ice cream, egg yolks, or full-fat cheeses.  Healthy desserts include angel food cake, ginger snaps, animal crackers, hard candy, popsicles, and  low-fat or nonfat frozen yogurt. Try not to eat pastries, cakes, pies, and cookies.  Exercise Follow your exercise program as told by your doctor.  Be more active. Try gardening, walking, and taking the stairs.  Ask your doctor about ways that you can be more active.  Medicine  Take over-the-counter and prescription medicines only as told by your doctor. This information is not intended to replace advice given to you by your health care provider. Make sure you discuss any questions you have with your health care provider. Document Released: 05/23/2008 Document Revised: 09/26/2015 Document Reviewed:  09/06/2015 Elsevier Interactive Patient Education  [employer].

## 2017-12-09 NOTE — Assessment & Plan Note (Addendum)
LDL remains above goal for secondary prevention. Patient continues to take rosuvastatin 10mg  daily but reports joint pain and leg cramps. She is currently on low fat, low carbs diet and daily exercise. Will repeat lipid panel today and initiate paperwork for PCSK9i and follow up with patient as needed.   Repatha/Praleunt indication, common side effects, administration, prior-authorization process, and patient assistance programs discussed during this visit as well.

## 2017-12-09 NOTE — Assessment & Plan Note (Signed)
Blood pressure well controlled today after ramipril dose changed 2 weeks ago. Patient denies problems with current therapy and reports compliance. She still working on positive lifestyle changes including low sodium diet, increase physical activity and weight loss.

## 2017-12-21 ENCOUNTER — Telehealth: Payer: Self-pay | Admitting: *Deleted

## 2017-12-21 NOTE — Telephone Encounter (Signed)
-----   Message from Skeet Latch, MD sent at 12/17/2017  9:18 AM EDT ----- Cholesterol levels are much better.  LDL still isn't <70 though and she can't tolerate increasing rosuvastatin.  Proceed with PCSK9 inhibitor as planned.

## 2017-12-21 NOTE — Telephone Encounter (Signed)
Left message to call back  

## 2017-12-24 NOTE — Telephone Encounter (Signed)
Left message to call back  

## 2017-12-24 NOTE — Telephone Encounter (Signed)
Follow up ° °Pt returning call for nurse °

## 2017-12-25 NOTE — Telephone Encounter (Signed)
Advised patient, verbalized understanding  

## 2018-01-04 ENCOUNTER — Encounter: Payer: Self-pay | Admitting: Adult Health

## 2018-01-04 ENCOUNTER — Ambulatory Visit (INDEPENDENT_AMBULATORY_CARE_PROVIDER_SITE_OTHER): Payer: Medicare Other | Admitting: Adult Health

## 2018-01-04 ENCOUNTER — Other Ambulatory Visit: Payer: Self-pay

## 2018-01-04 VITALS — BP 98/68 | HR 60 | Ht 65.0 in | Wt 178.0 lb

## 2018-01-04 DIAGNOSIS — G43009 Migraine without aura, not intractable, without status migrainosus: Secondary | ICD-10-CM

## 2018-01-04 DIAGNOSIS — I6523 Occlusion and stenosis of bilateral carotid arteries: Secondary | ICD-10-CM

## 2018-01-04 MED ORDER — FREMANEZUMAB-VFRM 225 MG/1.5ML ~~LOC~~ SOSY: 225.0000 mg | PREFILLED_SYRINGE | SUBCUTANEOUS | 5 refills | Status: DC

## 2018-01-04 MED ORDER — ESOMEPRAZOLE MAGNESIUM 20 MG PO CPDR: 20.0000 mg | DELAYED_RELEASE_CAPSULE | Freq: Every day | ORAL | 5 refills | Status: DC

## 2018-01-04 NOTE — Patient Instructions (Signed)
Your Plan:  Continue Zonegran and tizanidine  Ajovy ordered If your symptoms worsen or you develop new symptoms please let us know.   Thank you for coming to see [place] at [facility]. I hope we have been able to provide you high quality care today.  You may receive a patient satisfaction survey over the next few weeks. We would appreciate your feedback and comments so that we may continue to improve ourselves and the health of our patients.

## 2018-01-04 NOTE — Progress Notes (Signed)
I have read the note, and I agree with the clinical assessment and plan.  Anijah Spohr K Amontae Ng   

## 2018-01-04 NOTE — Progress Notes (Signed)
PATIENT: [name] DOB: 1950-12-13  REASON FOR VISIT: follow up HISTORY FROM: patient  HISTORY OF PRESENT ILLNESS: Today 01/04/18:  Ms. Rule is a 67 year old female with a history of intractable migraine headaches.  She returns today for follow-up.  Overall she feels that she has done relatively well.  He continues to have a daily dull headache that is in the right temporal region.  However she states that she has not had a severe headache in the last 6 months.  She reports she does have photophobia with her headaches.  She states that if she misses her medication then her headache will worsen.  She reports that she was unable to obtain Aimovig.  She continues on Zonegran and tizanidine.  HISTORY 07/01/17 [name] is a 67 year old female with a history of intractable migraine headaches.  She returns today for follow-up.  She reports that she continues to have a daily dull headache.  She remains on Zonegran and tizanidine.  She states that she did receive a letter about Aimovig but was unable to follow-up on this due to her brother being in the hospital.  She states that she only has approximately one severe migraine a month.  Her headaches seem to occur on the right side starting at the neck radiating to the eyes.  She does have photophobia, phonophobia and nausea.  She continues to take doxepin at night.  She returns today for an evaluation.  REVIEW OF SYSTEMS: Out of a complete 14 system review of symptoms, the patient complains only of the following symptoms, and all other reviewed systems are negative.  Fatigue, light sensitivity, shortness of breath, murmur, restless leg, insomnia, heat intolerance, joint swelling, muscle cramps, depression, nervous/anxious, dizziness  ALLERGIES: Allergies  Allergen Reactions  . Iodinated Diagnostic Agents Shortness Of Breath and Itching    States she had this reaction connected with a MRI of the cervical spine.  . Shellfish Allergy  Anaphylaxis and Other (See Comments)    All seafood  . Statins Other (See Comments)    Arthralgias (severe) with atorvastatin, mild arthralgias with rosuvastatin but willing to continue taking it    HOME MEDICATIONS: Outpatient Medications Prior to Visit  Medication Sig Dispense Refill  . clopidogrel (PLAVIX) 75 MG tablet TAKE 1 TABLET BY MOUTH DAILY 30 tablet 5  . doxepin (SINEQUAN) 100 MG capsule Take 2 capsules (200 mg total) by mouth at bedtime. 180 capsule 1  . esomeprazole (NEXIUM) 20 MG capsule Take 1 capsule (20 mg total) by mouth daily. 30 capsule 5  . furosemide (LASIX) 40 MG tablet Take 40 mg by mouth daily.    Marland Kitchen gabapentin (NEURONTIN) 600 MG tablet TAKE 1 TABLET BY MOUTH TWICE DAILY 180 tablet 1  . lidocaine (LIDODERM) 5 % Place 2 patches onto the skin daily. Remove & Discard patch within 12 hours or as directed by MD 60 patch 5  . meclizine (ANTIVERT) 25 MG tablet Take 1 tablet (25 mg total) by mouth 2 (two) times daily as needed for dizziness. 20 tablet 0  . ramipril (ALTACE) 5 MG capsule Take 1 capsule (5 mg total) by mouth daily. 90 capsule 1  . rosuvastatin (CRESTOR) 10 MG tablet Take 5mg  every Monday, Wednesday and Friday 90 tablet 0  . sertraline (ZOLOFT) 50 MG tablet Take 1 tablet (50 mg total) by mouth 2 (two) times daily. (Patient taking differently: Take 75 mg by mouth 2 (two) times daily. ) 180 tablet 3  . tiZANidine (ZANAFLEX) 2 MG  tablet Take 1 tablet (2 mg total) by mouth 3 (three) times daily. 270 tablet 3  . zonisamide (ZONEGRAN) 100 MG capsule Take 1 capsule (100 mg total) by mouth 2 (two) times daily. 60 capsule 11  . zonisamide (ZONEGRAN) 25 MG capsule Take an extra 25 mg twice a day for 2 weeks, then take 50 mg twice a day for a total of 150 mg twice a day. (Patient taking differently: Take by mouth. take 25 mg twice daily prn in addition to 100 mg twice daily.) 120 capsule 11  . naproxen (NAPROSYN) 500 MG tablet TAKE 1 TABLET(500 MG) BY MOUTH TWICE DAILY WITH A  MEAL FOR 14 DAYS (Patient not taking: Reported on 01/04/2018) 180 tablet 0   No facility-administered medications prior to visit.     PAST MEDICAL HISTORY: Past Medical History:  Diagnosis Date  . Chronic kidney disease    stage 3  . GERD (gastroesophageal reflux disease)   . Hyperlipidemia   . Migraine headache   . Occipital neuralgia    Left  . Renal insufficiency   . Stroke [facility]) 10/2013    PAST SURGICAL HISTORY: Past Surgical History:  Procedure Laterality Date  . ABDOMINAL HYSTERECTOMY    . BILIARY STENT PLACEMENT N/A 12/12/2015   Procedure: BILIARY STENT PLACEMENT;  Surgeon: Doran Stabler, MD;  Location: [facility];  Service: Endoscopy;  Laterality: N/A;  . BLADDER REPAIR    . CARPAL TUNNEL RELEASE    . CERVICAL SPINE SURGERY    . CHOLECYSTECTOMY N/A 12/06/2015   Procedure: LAPAROSCOPIC CHOLECYSTECTOMY WITH  INTRAOPERATIVE CHOLANGIOGRAM;  Surgeon: Stark Klein, MD;  Location: [place];  Service: General;  Laterality: N/A;  . ELBOW SURGERY    . ERCP N/A 12/12/2015   Procedure: ENDOSCOPIC RETROGRADE CHOLANGIOPANCREATOGRAPHY (ERCP);  Surgeon: Doran Stabler, MD;  Location: [facility] ENDOSCOPY;  Service: Endoscopy;  Laterality: N/A;  . ESOPHAGOGASTRODUODENOSCOPY N/A 01/28/2016   Procedure: ESOPHAGOGASTRODUODENOSCOPY (EGD)Biliary STENT removal;  Surgeon: Doran Stabler, MD;  Location: [facility];  Service: Gastroenterology;  Laterality: N/A;  . IR GENERIC HISTORICAL  12/17/2015   IR US GUIDE BX ASP/DRAIN 12/17/2015 [facility]  . IR GENERIC HISTORICAL  12/17/2015   IR GUIDED DRAIN W CATHETER PLACEMENT 12/17/2015 [facility]  . IR GENERIC HISTORICAL  12/17/2015   IR SINUS/FIST TUBE CHK-NON GI 12/17/2015 [facility]  . KNEE SURGERY    . LUNG SURGERY    . TEE WITHOUT CARDIOVERSION N/A 12/19/2015   Procedure: TRANSESOPHAGEAL ECHOCARDIOGRAM (TEE);  Surgeon: Josue Hector, MD;  Location: [facility] ENDOSCOPY;  Service: Cardiovascular;  Laterality: N/A;    FAMILY HISTORY: Family  History  Problem Relation Age of Onset  . Cancer Mother 75       breast  . Alzheimer's disease Mother   . Cancer Brother   . Hyperlipidemia Brother   . Prostate cancer Father   . Lung cancer Father   . Cancer Brother   . Hyperlipidemia Brother   . Dementia Brother        frontotemporal dementia    SOCIAL HISTORY: Social History   Socioeconomic History  . Marital status: Married    Spouse name: [name]  . Number of children: 1  . Years of education: 12+  . Highest education level: Not on file  Occupational History  . Occupation: ACTIVITY DIRECTOR    Employer: [employer]  . Occupation: CNA  Social Needs  . Financial resource strain: Not on file  . Food insecurity:  Worry: Not on file    Inability: Not on file  . Transportation needs:    Medical: Not on file    Non-medical: Not on file  Tobacco Use  . Smoking status: Former Smoker    Packs/day: 0.30    Types: Cigarettes    Last attempt to quit: 11/01/2011    Years since quitting: 6.1  . Smokeless tobacco: Never Used  Substance and Sexual Activity  . Alcohol use: No    Alcohol/week: 0.0 standard drinks  . Drug use: No  . Sexual activity: Yes    Partners: Male    Birth control/protection: Surgical    Comment: 1 sexual partner in last 12 months: married  Lifestyle  . Physical activity:    Days per week: Not on file    Minutes per session: Not on file  . Stress: Not on file  Relationships  . Social connections:    Talks on phone: Not on file    Gets together: Not on file    Attends religious service: Not on file    Active member of club or organization: Not on file    Attends meetings of clubs or organizations: Not on file    Relationship status: Not on file  . Intimate partner violence:    Fear of current or ex partner: Not on file    Emotionally abused: Not on file    Physically abused: Not on file    Forced sexual activity: Not on file  Other Topics Concern  . Not on file  Social History  Narrative   Patient is married ([name]).   Patient has one child.   Patient is retired   Patient is right handed.   Patient has a college education.   Patient drinks decaf coffee       PHYSICAL EXAM  Vitals:   01/04/18 0730  BP: 98/68  Pulse: 60  Weight: 178 lb (80.7 kg)  Height: 5\' 5"  (1.651 m)   Body mass index is 29.62 kg/m.  Generalized: Well developed, in no acute distress   Neurological examination  Mentation: Alert oriented to time, place, history taking. Follows all commands speech and language fluent Cranial nerve II-XII: Pupils were equal round reactive to light. Extraocular movements were full, visual field were full on confrontational test. Facial sensation and strength were normal. Uvula tongue midline. Head turning and shoulder shrug  were normal and symmetric. Motor: The motor testing reveals 5 over 5 strength of all 4 extremities. Good symmetric motor tone is noted throughout.  Sensory: Sensory testing is intact to soft touch on all 4 extremities. No evidence of extinction is noted.  Coordination: Cerebellar testing reveals good finger-nose-finger and heel-to-shin bilaterally.  Gait and station: Gait is normal. Tandem gait is normal. Romberg is negative. No drift is seen.  Reflexes: Deep tendon reflexes are symmetric and normal bilaterally.   DIAGNOSTIC DATA (LABS, IMAGING, TESTING) - I reviewed patient records, labs, notes, testing and imaging myself where available.  Lab Results  Component Value Date   WBC 5.6 07/28/2017   HGB 12.9 07/28/2017   HCT 39.7 07/28/2017   MCV 87.8 07/28/2017   PLT 196 07/28/2017      Component Value Date/Time   NA 141 12/09/2017 0914   K 4.5 12/09/2017 0914   CL 101 12/09/2017 0914   CO2 26 12/09/2017 0914   GLUCOSE 101 (H) 12/09/2017 0914   GLUCOSE 130 (H) 07/28/2017 0529   BUN 26 12/09/2017 0914   CREATININE 1.40 (H) 12/09/2017  2130   CREATININE 1.10 (H) 02/25/2016 0907   CALCIUM 9.1 12/09/2017 0914   PROT 6.1  12/09/2017 0914   ALBUMIN 4.3 12/09/2017 0914   AST 26 12/09/2017 0914   ALT 25 12/09/2017 0914   ALKPHOS 94 12/09/2017 0914   BILITOT 0.3 12/09/2017 0914   GFRNONAA 39 (L) 12/09/2017 0914   GFRNONAA 53 (L) 02/25/2016 0907   GFRAA 45 (L) 12/09/2017 0914   GFRAA 61 02/25/2016 0907   Lab Results  Component Value Date   CHOL 156 12/09/2017   HDL 59 12/09/2017   LDLCALC 78 12/09/2017   LDLDIRECT 156 (H) 03/27/2017   TRIG 96 12/09/2017   CHOLHDL 2.6 12/09/2017   Lab Results  Component Value Date   HGBA1C 5.5 07/30/2017   Lab Results  Component Value Date   VITAMINB12 290 07/21/2017   Lab Results  Component Value Date   TSH 0.970 07/21/2017      ASSESSMENT AND PLAN 67 y.o. year old female  has a past medical history of Chronic kidney disease, GERD (gastroesophageal reflux disease), Hyperlipidemia, Migraine headache, Occipital neuralgia, Renal insufficiency, and Stroke ([place]) (10/2013). here with:  1.  Migraine headaches  Overall the patient has remained stable.  She will continue on tizanidine and Zonegran.  The patient continues to have dull daily headaches.  We will try Ajovy.  I have reviewed the medication with the patient and provided her with a handout.  She is advised that if her symptoms worsen or she develops a new symptoms she should let us know.  She will follow-up in 6 months or sooner if needed.   I spent 15 minutes with the patient. 50% of this time was spent reviewing her plan of care   Ward Givens, MSN, NP-C 01/04/2018, 7:38 AM [facility] [address] [place], [place] [address] [phone]

## 2018-01-08 ENCOUNTER — Ambulatory Visit (INDEPENDENT_AMBULATORY_CARE_PROVIDER_SITE_OTHER): Payer: Medicare Other | Admitting: Podiatry

## 2018-01-08 ENCOUNTER — Ambulatory Visit (INDEPENDENT_AMBULATORY_CARE_PROVIDER_SITE_OTHER): Payer: Medicare Other

## 2018-01-08 DIAGNOSIS — K219 Gastro-esophageal reflux disease without esophagitis: Secondary | ICD-10-CM | POA: Insufficient documentation

## 2018-01-08 DIAGNOSIS — N951 Menopausal and female climacteric states: Secondary | ICD-10-CM | POA: Insufficient documentation

## 2018-01-08 DIAGNOSIS — I6523 Occlusion and stenosis of bilateral carotid arteries: Secondary | ICD-10-CM | POA: Diagnosis not present

## 2018-01-08 DIAGNOSIS — M722 Plantar fascial fibromatosis: Secondary | ICD-10-CM

## 2018-01-08 DIAGNOSIS — D649 Anemia, unspecified: Secondary | ICD-10-CM | POA: Insufficient documentation

## 2018-01-08 DIAGNOSIS — F419 Anxiety disorder, unspecified: Secondary | ICD-10-CM | POA: Insufficient documentation

## 2018-01-08 MED ORDER — TRIAMCINOLONE ACETONIDE 10 MG/ML IJ SUSP
10.0000 mg | Freq: Once | INTRAMUSCULAR | Status: AC
  Administered 2018-01-08: 10 mg

## 2018-01-08 NOTE — Patient Instructions (Signed)

## 2018-01-10 DIAGNOSIS — M722 Plantar fascial fibromatosis: Secondary | ICD-10-CM | POA: Insufficient documentation

## 2018-01-10 NOTE — Progress Notes (Signed)
Subjective:   Patient ID: [name], female   DOB: 67 y.o.   MRN: [id number]   HPI 67 year old female presents the office today for concerns of bilateral heel pain with the left side worse than the right.  She states that she gets pain in the morning she first gets up and gets somewhat better with activity level but is becoming more consistent throughout the day.  Is been ongoing the last 4 weeks and she denies any recent injury or trauma or any change or increase in activity level.  She is tried over-the-counter inserts and stretching with minimal relief.  She denies any pain that wakes her up.  She states that hurts on the bottom of the heel into the arch again on the left side worse than right.  No other concerns.   Review of Systems  All other systems reviewed and are negative.  Past Medical History:  Diagnosis Date  . Chronic kidney disease    stage 3  . GERD (gastroesophageal reflux disease)   . Hyperlipidemia   . Migraine headache   . Occipital neuralgia    Left  . Renal insufficiency   . Stroke [facility]) 10/2013       Current Outpatient Medications:  .  amoxicillin (AMOXIL) 875 MG tablet, amoxicillin 875 mg tablet  TAKE 2 TABLETS (1,750 MG TOTAL) BY MOUTH 2 (TWO) TIMES DAILY., Disp: , Rfl:  .  atorvastatin (LIPITOR) 40 MG tablet, atorvastatin 40 mg tablet  TAKE 1 TABLET BY MOUTH EVERY DAY, Disp: , Rfl:  .  baclofen (LIORESAL) 10 MG tablet, baclofen 10 mg tablet, Disp: , Rfl:  .  benzonatate (TESSALON) 200 MG capsule, benzonatate 200 mg capsule  TK ONE C PO TID PRN COU, Disp: , Rfl:  .  Besifloxacin HCl (BESIVANCE) 0.6 % SUSP, Besivance 0.6 % eye drops,suspension  1 DROP IN RIGHT EYE THREE TIMES A DAY FOR 30 DAYS, Disp: , Rfl:  .  cefdinir (OMNICEF) 300 MG capsule, cefdinir 300 mg capsule  TAKE 2 CAPSULES (600 MG TOTAL) BY MOUTH DAILY., Disp: , Rfl:  .  cephALEXin (KEFLEX) 500 MG capsule, cephalexin 500 mg capsule  TAKE ONE CAPSULE BY MOUTH EVERY 12 HOURS, Disp: , Rfl:  .   clidinium-chlordiazePOXIDE (LIBRAX) 5-2.5 MG capsule, chlordiazepoxide-clidinium 5 mg-2.5 mg capsule  TAKE AS DIRECTED, Disp: , Rfl:  .  clopidogrel (PLAVIX) 75 MG tablet, TAKE 1 TABLET BY MOUTH DAILY, Disp: 30 tablet, Rfl: 5 .  cyclobenzaprine (FLEXERIL) 10 MG tablet, cyclobenzaprine 10 mg tablet, Disp: , Rfl:  .  Difluprednate (DUREZOL) 0.05 % EMUL, Durezol 0.05 % eye drops  1 DROP IN RIGHT EYE THREE TIMES A DAY FOR 30 DAYS, Disp: , Rfl:  .  doxepin (SINEQUAN) 100 MG capsule, Take 2 capsules (200 mg total) by mouth at bedtime., Disp: 180 capsule, Rfl: 1 .  esomeprazole (NEXIUM) 20 MG capsule, Take 1 capsule (20 mg total) by mouth daily., Disp: 30 capsule, Rfl: 5 .  ezetimibe (ZETIA) 10 MG tablet, ezetimibe 10 mg tablet, Disp: , Rfl:  .  fluticasone (FLONASE) 50 MCG/ACT nasal spray, fluticasone propionate 50 mcg/actuation nasal spray,suspension  PLACE 2 SPRAYS INTO BOTH NOSTRILS DAILY., Disp: , Rfl:  .  Fremanezumab-vfrm (AJOVY) 225 MG/1.5ML SOSY, Inject 225 mg into the skin every 30 (thirty) days., Disp: 1.5 mL, Rfl: 5 .  furosemide (LASIX) 40 MG tablet, Take 40 mg by mouth daily., Disp: , Rfl:  .  gabapentin (NEURONTIN) 600 MG tablet, TAKE 1 TABLET BY MOUTH  TWICE DAILY, Disp: 180 tablet, Rfl: 1 .  Guaifenesin (MUCINEX MAXIMUM STRENGTH) 1200 MG TB12, Mucinex 1,200 mg tablet, extended release  TAKE 1 TABLET (1,200 MG TOTAL) BY MOUTH EVERY 12 (TWELVE) HOURS AS NEEDED., Disp: , Rfl:  .  guaiFENesin (MUCINEX) 600 MG 12 hr tablet, guaifenesin ER 600 mg tablet, extended release 12 hr  TAKE 1 TABLET (600 MG TOTAL) BY MOUTH 2 (TWO) TIMES DAILY AS NEEDED FOR COUGH OR TO LOOSEN PHLEGM., Disp: , Rfl:  .  HYDROcodone-homatropine (HYCODAN) 5-1.5 MG/5ML syrup, hydrocodone-homatropine 5 mg-1.5 mg/5 mL oral syrup  TAKE 5 MLS BY MOUTH EVERY 8 HOURS AS NEEDED FOR COUGH, Disp: , Rfl:  .  ipratropium (ATROVENT) 0.03 % nasal spray, ipratropium bromide 0.03 % nasal spray  PLACE 2 SPRAYS INTO BOTH NOSTRILS 2 (TWO) TIMES  DAILY., Disp: , Rfl:  .  ketorolac (TORADOL) 10 MG tablet, ketorolac 10 mg tablet  TAKE 1 TABLET 3 TIMES DAILY, Disp: , Rfl:  .  lidocaine (LIDODERM) 5 %, Place 2 patches onto the skin daily. Remove & Discard patch within 12 hours or as directed by MD, Disp: 60 patch, Rfl: 5 .  meclizine (ANTIVERT) 25 MG tablet, Take 1 tablet (25 mg total) by mouth 2 (two) times daily as needed for dizziness., Disp: 20 tablet, Rfl: 0 .  metoCLOPramide (REGLAN) 5 MG tablet, metoclopramide 5 mg tablet  TAKE 1 TABLET 3 TIMES DAILY., Disp: , Rfl:  .  naproxen (NAPROSYN) 500 MG tablet, TAKE 1 TABLET(500 MG) BY MOUTH TWICE DAILY WITH A MEAL FOR 14 DAYS (Patient not taking: Reported on 01/04/2018), Disp: 180 tablet, Rfl: 0 .  nepafenac (ILEVRO) 0.3 % ophthalmic suspension, Ilevro 0.3 % eye drops,suspension  1 DROP IN RIGHT EYE AT BEDTIME FOR 30 DAYS, Disp: , Rfl:  .  oxyCODONE-acetaminophen (PERCOCET/ROXICET) 5-325 MG tablet, oxycodone-acetaminophen 5 mg-325 mg tablet  TK 1 T PO Q 6 H, Disp: , Rfl:  .  propranolol (INDERAL) 10 MG tablet, propranolol 10 mg tablet  TAKE 1 TABLET BY MOUTH TWICE A DAY, Disp: , Rfl:  .  pseudoephedrine-guaifenesin (MUCINEX D) 60-600 MG 12 hr tablet, Mucinex D 60 mg-600 mg tablet,extended release  TAKE 1 TABLET BY MOUTH EVERY 12 (TWELVE) HOURS., Disp: , Rfl:  .  ramipril (ALTACE) 5 MG capsule, Take 1 capsule (5 mg total) by mouth daily., Disp: 90 capsule, Rfl: 1 .  rosuvastatin (CRESTOR) 10 MG tablet, Take 5mg  every Monday, Wednesday and Friday, Disp: 90 tablet, Rfl: 0 .  sertraline (ZOLOFT) 50 MG tablet, Take 1 tablet (50 mg total) by mouth 2 (two) times daily. (Patient taking differently: Take 75 mg by mouth 2 (two) times daily. ), Disp: 180 tablet, Rfl: 3 .  sulfamethoxazole-trimethoprim (BACTRIM DS,SEPTRA DS) 800-160 MG tablet, sulfamethoxazole 800 mg-trimethoprim 160 mg tablet  TAKE 1 TABLET BY MOUTH 2 (TWO) TIMES DAILY., Disp: , Rfl:  .  tiZANidine (ZANAFLEX) 2 MG tablet, Take 1 tablet (2 mg  total) by mouth 3 (three) times daily., Disp: 270 tablet, Rfl: 3 .  topiramate (TOPAMAX) 100 MG tablet, topiramate 100 mg tablet  TAKE 1 TABLET (100 MG TOTAL) BY MOUTH 2 (TWO) TIMES DAILY., Disp: , Rfl:  .  vancomycin (VANCOCIN) 125 MG capsule, vancomycin 125 mg capsule, Disp: , Rfl:  .  zonisamide (ZONEGRAN) 100 MG capsule, Take 1 capsule (100 mg total) by mouth 2 (two) times daily., Disp: 60 capsule, Rfl: 11 .  zonisamide (ZONEGRAN) 25 MG capsule, Take an extra 25 mg twice a day for  2 weeks, then take 50 mg twice a day for a total of 150 mg twice a day. (Patient taking differently: Take by mouth. take 25 mg twice daily prn in addition to 100 mg twice daily.), Disp: 120 capsule, Rfl: 11  Allergies  Allergen Reactions  . Iodinated Diagnostic Agents Shortness Of Breath and Itching    States she had this reaction connected with a MRI of the cervical spine.  . Shellfish Allergy Anaphylaxis and Other (See Comments)    All seafood  . Statins Other (See Comments)    Arthralgias (severe) with atorvastatin, mild arthralgias with rosuvastatin but willing to continue taking it         Objective:  Physical Exam  General: AAO x3, NAD  Dermatological: Skin is warm, dry and supple bilateral. Nails x 10 are well manicured; remaining integument appears unremarkable at this time. There are no open sores, no preulcerative lesions, no rash or signs of infection present.  Vascular: Dorsalis Pedis artery and Posterior Tibial artery pedal pulses are 2/4 bilateral with immedate capillary fill time.  There is no pain with calf compression, swelling, warmth, erythema.   Neruologic: Grossly intact via light touch bilateral. Protective threshold with Semmes Wienstein monofilament intact to all pedal sites bilateral.  Negative Tinel sign.  Musculoskeletal: There is tenderness palpation on the plantar medial tubercle of the calcaneus at the insertion of the plantar fascia with the left side worse than the right.   Minimal swelling present to the plantar aspect left heel.  There is no pain with lateral compression of the calcaneus.  No pain to the Achilles tendon.  Achilles tendon appears to be intact.  No other areas of pinpoint tenderness.  Mild discomfort in the medial band within the arch of the foot on the left side but no significant pain in the arch of the right foot.  Muscular strength 5/5 in all groups tested bilateral.  Gait: Unassisted, Nonantalgic.       Assessment:   Bilateral heel pain, likely plantar fasciitis    Plan:  -Treatment options discussed including all alternatives, risks, and complications -Etiology of symptoms were discussed -X-rays were obtained and reviewed with the patient.  No evidence of acute fracture or stress fracture. -Steroid injection performed bilaterally.  See procedure note below. -Plantar fascial braces dispensed x2 -Continue stretching, icing exercises daily.  Discussed shoe gear modifications and orthotics.  Procedure: Injection Tendon/Ligament Discussed alternatives, risks, complications and verbal consent was obtained.  Location: Bilateral plantar fascia at the glabrous junction; medial approach. Skin Prep: Alcohol. Injectate: 0.5cc 0.5% marcaine plain, 0.5 cc 2% lidocaine plain and, 1 cc kenalog 10. Disposition: Patient tolerated procedure well. Injection site dressed with a band-aid.  Post-injection care was discussed and return precautions discussed.      Trula Slade DPM

## 2018-01-12 ENCOUNTER — Telehealth: Payer: Self-pay

## 2018-01-12 NOTE — Telephone Encounter (Signed)
RN call patient that the [name] was approve. Rn recommend she call the pharmacy to make sure they have it on stock. Rn explain that she cannot use the copay card because she has medicare. Its only for [employer]. Pt verbalized understanding.

## 2018-01-12 NOTE — Telephone Encounter (Signed)
PTs ID number is [id number]. Rn call [phone] to do PA.  The customer service stated pts plan for Aimovig and Ajovy is on her non preferred drug plan. Its not covered by insurance unless she has failed or tried two of the preferred drugs which are  preferred drugs are inderal, topirimate, metoprolol, diltiazem. RN did PA on the phone pt has taken,and tried topirmate,and propranolol.  PA approve from 12/13/2017 to 01/12/2019. Confirmation number is [id number].

## 2018-01-13 NOTE — Telephone Encounter (Addendum)
Ajovy 225 mg has been approved 12/13/17 through 01/12/2019.

## 2018-01-14 ENCOUNTER — Telehealth: Payer: Self-pay | Admitting: *Deleted

## 2018-01-14 NOTE — Telephone Encounter (Signed)
Received papers from patient from [facility] [employer] of denial of Doxepin. Called [facility] medicare, spoke with [place]. She stated the denial was on 01/01/18, an appeal has been received and is still in process. She named [place] as person sending in appeal. This RN advised her that person does not work in this office. This RN will call patient and advise her of same and that an appeal has been sent to [facility].  Spoke with patient and informed her that this RN has no record of PA for Doxepin. Advised her this RN called [facility] and the PA is being handled by Dr B Ardelia Mems with  [facility]. Informed her that an appeal is in process to try and get it approved for her. Advised she should hear back from that office when it is complete. She verbalized understanding, appreciation. This RN will mail papers back to patient for her records.

## 2018-01-24 ENCOUNTER — Other Ambulatory Visit: Payer: Self-pay | Admitting: Neurology

## 2018-01-29 ENCOUNTER — Ambulatory Visit: Payer: Medicare Other | Admitting: Podiatry

## 2018-02-16 DIAGNOSIS — M1712 Unilateral primary osteoarthritis, left knee: Secondary | ICD-10-CM | POA: Diagnosis not present

## 2018-02-26 DIAGNOSIS — M170 Bilateral primary osteoarthritis of knee: Secondary | ICD-10-CM | POA: Diagnosis not present

## 2018-03-14 ENCOUNTER — Other Ambulatory Visit: Payer: Self-pay | Admitting: Physical Medicine & Rehabilitation

## 2018-03-24 DIAGNOSIS — M25562 Pain in left knee: Secondary | ICD-10-CM | POA: Diagnosis not present

## 2018-03-24 DIAGNOSIS — M25561 Pain in right knee: Secondary | ICD-10-CM | POA: Diagnosis not present

## 2018-03-24 DIAGNOSIS — M545 Low back pain: Secondary | ICD-10-CM | POA: Diagnosis not present

## 2018-03-26 DIAGNOSIS — M545 Low back pain: Secondary | ICD-10-CM | POA: Diagnosis not present

## 2018-04-03 ENCOUNTER — Other Ambulatory Visit: Payer: Self-pay

## 2018-04-03 ENCOUNTER — Encounter (HOSPITAL_COMMUNITY): Payer: Self-pay | Admitting: *Deleted

## 2018-04-03 ENCOUNTER — Ambulatory Visit (INDEPENDENT_AMBULATORY_CARE_PROVIDER_SITE_OTHER)
Admission: EM | Admit: 2018-04-03 | Discharge: 2018-04-03 | Disposition: A | Discharge status: Home / Self Care | Payer: Medicare Other | Source: Home / Self Care

## 2018-04-03 ENCOUNTER — Encounter (HOSPITAL_COMMUNITY): Payer: Self-pay | Admitting: Emergency Medicine

## 2018-04-03 ENCOUNTER — Inpatient Hospital Stay (HOSPITAL_COMMUNITY)
Admission: EM | Admit: 2018-04-03 | Discharge: 2018-04-08 | DRG: 872 | Disposition: A | Discharge status: Home / Self Care | Payer: Medicare Other | Attending: Family Medicine | Admitting: Family Medicine

## 2018-04-03 DIAGNOSIS — A4151 Sepsis due to Escherichia coli [E. coli]: Secondary | ICD-10-CM | POA: Diagnosis not present

## 2018-04-03 DIAGNOSIS — R350 Frequency of micturition: Secondary | ICD-10-CM

## 2018-04-03 DIAGNOSIS — Z86711 Personal history of pulmonary embolism: Secondary | ICD-10-CM | POA: Diagnosis not present

## 2018-04-03 DIAGNOSIS — Z8673 Personal history of transient ischemic attack (TIA), and cerebral infarction without residual deficits: Secondary | ICD-10-CM

## 2018-04-03 DIAGNOSIS — R05 Cough: Secondary | ICD-10-CM

## 2018-04-03 DIAGNOSIS — E785 Hyperlipidemia, unspecified: Secondary | ICD-10-CM | POA: Diagnosis not present

## 2018-04-03 DIAGNOSIS — R6889 Other general symptoms and signs: Secondary | ICD-10-CM | POA: Insufficient documentation

## 2018-04-03 DIAGNOSIS — N12 Tubulo-interstitial nephritis, not specified as acute or chronic: Secondary | ICD-10-CM | POA: Diagnosis present

## 2018-04-03 DIAGNOSIS — R69 Illness, unspecified: Secondary | ICD-10-CM | POA: Diagnosis not present

## 2018-04-03 DIAGNOSIS — R197 Diarrhea, unspecified: Secondary | ICD-10-CM | POA: Diagnosis present

## 2018-04-03 DIAGNOSIS — R112 Nausea with vomiting, unspecified: Secondary | ICD-10-CM | POA: Diagnosis not present

## 2018-04-03 DIAGNOSIS — Z888 Allergy status to other drugs, medicaments and biological substances status: Secondary | ICD-10-CM

## 2018-04-03 DIAGNOSIS — Z79899 Other long term (current) drug therapy: Secondary | ICD-10-CM

## 2018-04-03 DIAGNOSIS — R42 Dizziness and giddiness: Secondary | ICD-10-CM | POA: Diagnosis not present

## 2018-04-03 DIAGNOSIS — R51 Headache: Secondary | ICD-10-CM | POA: Diagnosis not present

## 2018-04-03 DIAGNOSIS — N390 Urinary tract infection, site not specified: Secondary | ICD-10-CM | POA: Diagnosis not present

## 2018-04-03 DIAGNOSIS — R319 Hematuria, unspecified: Secondary | ICD-10-CM

## 2018-04-03 DIAGNOSIS — Z7902 Long term (current) use of antithrombotics/antiplatelets: Secondary | ICD-10-CM

## 2018-04-03 DIAGNOSIS — I129 Hypertensive chronic kidney disease with stage 1 through stage 4 chronic kidney disease, or unspecified chronic kidney disease: Secondary | ICD-10-CM | POA: Diagnosis present

## 2018-04-03 DIAGNOSIS — R0902 Hypoxemia: Secondary | ICD-10-CM | POA: Diagnosis not present

## 2018-04-03 DIAGNOSIS — E872 Acidosis: Secondary | ICD-10-CM | POA: Diagnosis not present

## 2018-04-03 DIAGNOSIS — J111 Influenza due to unidentified influenza virus with other respiratory manifestations: Secondary | ICD-10-CM

## 2018-04-03 DIAGNOSIS — I10 Essential (primary) hypertension: Secondary | ICD-10-CM | POA: Diagnosis not present

## 2018-04-03 DIAGNOSIS — G43909 Migraine, unspecified, not intractable, without status migrainosus: Secondary | ICD-10-CM | POA: Diagnosis present

## 2018-04-03 DIAGNOSIS — M7918 Myalgia, other site: Secondary | ICD-10-CM

## 2018-04-03 DIAGNOSIS — Z91013 Allergy to seafood: Secondary | ICD-10-CM

## 2018-04-03 DIAGNOSIS — K219 Gastro-esophageal reflux disease without esophagitis: Secondary | ICD-10-CM | POA: Diagnosis present

## 2018-04-03 DIAGNOSIS — R059 Cough, unspecified: Secondary | ICD-10-CM

## 2018-04-03 DIAGNOSIS — R110 Nausea: Secondary | ICD-10-CM | POA: Diagnosis not present

## 2018-04-03 DIAGNOSIS — N183 Chronic kidney disease, stage 3 (moderate): Secondary | ICD-10-CM | POA: Diagnosis present

## 2018-04-03 DIAGNOSIS — Z87891 Personal history of nicotine dependence: Secondary | ICD-10-CM

## 2018-04-03 DIAGNOSIS — R52 Pain, unspecified: Secondary | ICD-10-CM

## 2018-04-03 DIAGNOSIS — R0689 Other abnormalities of breathing: Secondary | ICD-10-CM | POA: Diagnosis not present

## 2018-04-03 DIAGNOSIS — Z91041 Radiographic dye allergy status: Secondary | ICD-10-CM

## 2018-04-03 LAB — POCT URINALYSIS DIP (DEVICE)
Bilirubin Urine: NEGATIVE
Glucose, UA: NEGATIVE mg/dL
Ketones, ur: NEGATIVE mg/dL
Leukocytes, UA: NEGATIVE
Nitrite: NEGATIVE
Protein, ur: NEGATIVE mg/dL
Specific Gravity, Urine: 1.015 (ref 1.005–1.030)
Urobilinogen, UA: 0.2 mg/dL (ref 0.0–1.0)
pH: 5 (ref 5.0–8.0)

## 2018-04-03 MED ORDER — SODIUM CHLORIDE 0.9% FLUSH
3.0000 mL | Freq: Once | INTRAVENOUS | Status: AC
  Administered 2018-04-04: 3 mL via INTRAVENOUS

## 2018-04-03 MED ORDER — KETOROLAC TROMETHAMINE 30 MG/ML IJ SOLN
15.0000 mg | Freq: Once | INTRAMUSCULAR | Status: AC
  Administered 2018-04-03: 15 mg via INTRAVENOUS
  Filled 2018-04-03: qty 1

## 2018-04-03 MED ORDER — SODIUM CHLORIDE 0.9 % IV BOLUS
1000.0000 mL | Freq: Once | INTRAVENOUS | Status: AC
  Administered 2018-04-03: 1000 mL via INTRAVENOUS

## 2018-04-03 MED ORDER — ACETAMINOPHEN 325 MG PO TABS
650.0000 mg | ORAL_TABLET | Freq: Once | ORAL | Status: AC
  Administered 2018-04-03: 650 mg via ORAL

## 2018-04-03 MED ORDER — OSELTAMIVIR PHOSPHATE 75 MG PO CAPS: 75.0000 mg | ORAL_CAPSULE | Freq: Two times a day (BID) | ORAL | 0 refills | Status: DC

## 2018-04-03 MED ORDER — ACETAMINOPHEN 325 MG PO TABS
650.0000 mg | ORAL_TABLET | Freq: Once | ORAL | Status: AC
  Administered 2018-04-03: 650 mg via ORAL
  Filled 2018-04-03: qty 2

## 2018-04-03 MED ORDER — ONDANSETRON HCL 4 MG/2ML IJ SOLN: 4.0000 mg | Freq: Once | INTRAMUSCULAR | Status: DC

## 2018-04-03 MED ORDER — ONDANSETRON HCL 4 MG/2ML IJ SOLN
4.0000 mg | Freq: Once | INTRAMUSCULAR | Status: AC
  Administered 2018-04-03: 4 mg via INTRAVENOUS
  Filled 2018-04-03: qty 2

## 2018-04-03 MED ORDER — ACETAMINOPHEN 325 MG PO TABS
ORAL_TABLET | ORAL | Status: AC
  Filled 2018-04-03: qty 2

## 2018-04-03 NOTE — ED Triage Notes (Signed)
Pt arrived by ptar from home  She has had a temp aching all over her body and a temp since this am.. [facility] and  Was told to stay hydrated and rest.. since then she has had n v and diarrhea anda a temp.  She is unable to hold down any liquids or meds

## 2018-04-03 NOTE — ED Provider Notes (Signed)
[facility]    CSN: [id number] Arrival date & time: 04/03/18  1006     History   Chief Complaint Chief Complaint  Patient presents with  . Generalized Body Aches  . Fever   * HPI [name] is a 68 y.o. female.   Who presents with flu symptoms.  While at work today felt onset of body aches and chills and fever. Has had rhinitis today. Had a cough last time. Two weeks ago thought she was getting the flu and had aches, and fever of 101, but no rhinitis or cough. This lasted 2 days. 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      Past Medical History:  Diagnosis Date  . Chronic kidney disease    stage 3  . GERD (gastroesophageal reflux disease)   . Hyperlipidemia   . Migraine headache   . Occipital neuralgia    Left  . Renal insufficiency   . Stroke [facility]) 10/2013    Patient Active Problem List   Diagnosis Date Noted  . Plantar fasciitis 01/10/2018  . Chronic anxiety 01/08/2018  . Gastroesophageal reflux disease 01/08/2018  . Hemoglobin low 01/08/2018  . Menopausal syndrome 01/08/2018  . Patellar contusion, initial encounter 11/07/2017  . Arthralgia of right temporomandibular joint 09/28/2017  . Leg swelling 08/24/2017  . Positive D-dimer   . Right leg pain   . Pulmonary embolism ([place])   . Coronary artery calcification   . Dyspnea 07/26/2017  . Sinus infection 04/18/2017  . Osteoarthritis of knee 04/08/2017  . Right ear pain 01/02/2017  . Easy bruising 11/10/2016  . Hospital discharge follow-up 01/25/2016  . Pneumonia 01/14/2016  . SOB (shortness of breath)   . Difficulty in walking, not elsewhere classified   . Diarrhea of presumed infectious origin   . LFT elevation   . Hypokalemia   . Enteritis due to Clostridium difficile   . Pleural effusion on right   . Bilateral pleural effusion   . Biloma   . Biloma following surgery   . Status post cholecystectomy   . Infection by Streptococcus, viridans group    . Bile leak, postoperative   . Sepsis ([place]) 12/11/2015  . Symptomatic cholelithiasis 12/05/2015  . Hyperlipidemia LDL goal <70 04/02/2015  . Chronic migraine without aura, with status migrainosus 09/26/2014  . Dizziness 11/09/2013  . Adjustment disorder with mixed anxiety and depressed mood 11/03/2013  . Migraine variant 10/10/2013  . Disturbance of skin sensation 10/10/2013  . TIA (transient ischemic attack) 10/10/2013  . HTN (hypertension) 07/18/2013  . 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 Neuralgia, neuritis, and radiculitis, unspecified 02/04/2012  . 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 Myofascial muscle pain 07/15/2011   OB History   No obstetric history on file.      Home Medications    Prior to Admission medications   Medication Sig Start Date End Date Taking? Authorizing Provider  atorvastatin (LIPITOR) 40 MG tablet atorvastatin 40 mg tablet  TAKE 1 TABLET BY MOUTH EVERY DAY    [provider]  baclofen (LIORESAL) 10 MG tablet baclofen 10 mg tablet    [provider]  Besifloxacin HCl (BESIVANCE) 0.6 % SUSP Besivance 0.6 % eye drops,suspension  1 DROP IN RIGHT EYE THREE TIMES A DAY FOR 30 DAYS    [provider]  clidinium-chlordiazePOXIDE (LIBRAX) 5-2.5 MG capsule chlordiazepoxide-clidinium 5 mg-2.5 mg capsule  TAKE AS DIRECTED    [provider]  clopidogrel (PLAVIX) 75 MG  tablet TAKE 1 TABLET BY MOUTH DAILY 08/27/16   McKeag, Marylynn Pearson, MD  Difluprednate (DUREZOL) 0.05 % EMUL Durezol 0.05 % eye drops  1 DROP IN RIGHT EYE THREE TIMES A DAY FOR 30 DAYS    [provider]  doxepin (SINEQUAN) 100 MG capsule Take 2 capsules (200 mg total) by mouth at bedtime. 10/15/14   Kathrynn Ducking, MD  esomeprazole (NEXIUM) 20 MG capsule Take 1 capsule (20 mg total) by mouth daily. 01/04/18   Lovenia Kim, MD  ezetimibe (ZETIA) 10 MG tablet ezetimibe 10 mg tablet    [provider]  fluticasone (FLONASE) 50 MCG/ACT nasal  spray fluticasone propionate 50 mcg/actuation nasal spray,suspension  PLACE 2 SPRAYS INTO BOTH NOSTRILS DAILY.    [provider]  Fremanezumab-vfrm (AJOVY) 225 MG/1.5ML SOSY Inject 225 mg into the skin every 30 (thirty) days. 01/04/18   Ward Givens, NP  furosemide (LASIX) 40 MG tablet Take 40 mg by mouth daily. 07/24/17   [provider]  gabapentin (NEURONTIN) 600 MG tablet TAKE 1 TABLET BY MOUTH TWICE DAILY 07/27/17   Ward Givens, NP  ketorolac (TORADOL) 10 MG tablet ketorolac 10 mg tablet  TAKE 1 TABLET 3 TIMES DAILY    [provider]  lidocaine (LIDODERM) 5 % Place 2 patches onto the skin daily. Apply to 2 patches to affected area in the AM, then remove after 12 hours 03/15/18   Kirsteins, Luanna Salk, MD  meclizine (ANTIVERT) 25 MG tablet Take 1 tablet (25 mg total) by mouth 2 (two) times daily as needed for dizziness. 07/16/15   McKeag, Marylynn Pearson, MD  metoCLOPramide (REGLAN) 5 MG tablet metoclopramide 5 mg tablet  TAKE 1 TABLET 3 TIMES DAILY.    [provider]  nepafenac (ILEVRO) 0.3 % ophthalmic suspension Ilevro 0.3 % eye drops,suspension  1 DROP IN RIGHT EYE AT BEDTIME FOR 30 DAYS    [provider]  oseltamivir (TAMIFLU) 75 MG capsule Take 1 capsule (75 mg total) by mouth every 12 (twelve) hours. 04/03/18   Rodriguez-Southworth, Sunday Spillers, PA-C  oxyCODONE-acetaminophen (PERCOCET/ROXICET) 5-325 MG tablet oxycodone-acetaminophen 5 mg-325 mg tablet  TK 1 T PO Q 6 H    [provider]  propranolol (INDERAL) 10 MG tablet propranolol 10 mg tablet  TAKE 1 TABLET BY MOUTH TWICE A DAY    [provider]  ramipril (ALTACE) 5 MG capsule Take 1 capsule (5 mg total) by mouth daily. 11/20/17   Skeet Latch, MD  rosuvastatin (CRESTOR) 10 MG tablet Take 5mg  every Monday, Wednesday and Friday 12/09/17   Skeet Latch, MD  tiZANidine (ZANAFLEX) 2 MG tablet Take 1 tablet (2 mg total) by mouth 3 (three) times daily. 07/01/17   Ward Givens,  NP  topiramate (TOPAMAX) 100 MG tablet topiramate 100 mg tablet  TAKE 1 TABLET (100 MG TOTAL) BY MOUTH 2 (TWO) TIMES DAILY.    [provider]  zonisamide (ZONEGRAN) 100 MG capsule Take 1 capsule (100 mg total) by mouth 2 (two) times daily. 07/01/17   Ward Givens, NP    Family History Family History  Problem Relation Age of Onset  . Cancer Mother 55       breast  . Alzheimer's disease Mother   . Cancer Brother   . Hyperlipidemia Brother   . Prostate cancer Father   . Lung cancer Father   . Cancer Brother   . Hyperlipidemia Brother   . Dementia Brother        frontotemporal dementia  Social History Social History   Tobacco Use  . Smoking status: Former Smoker    Packs/day: 0.30    Types: Cigarettes    Last attempt to quit: 11/01/2011    Years since quitting: 6.4  . Smokeless tobacco: Never Used  Substance Use Topics  . Alcohol use: No    Alcohol/week: 0.0 standard drinks  . Drug use: No     Allergies   Iodinated diagnostic agents; Shellfish allergy; and Statins   Review of Systems Review of Systems  Constitutional: Positive for appetite change, chills, diaphoresis and fever. Negative for activity change.  HENT: Positive for postnasal drip and rhinorrhea. Negative for congestion, ear discharge, ear pain, facial swelling, sinus pressure, sinus pain, sore throat, trouble swallowing and voice change.   Eyes: Negative for discharge.  Respiratory: Positive for cough. Negative for chest tightness and shortness of breath.   Cardiovascular: Negative for chest pain and palpitations.  Gastrointestinal: Positive for nausea. Negative for abdominal pain, diarrhea and vomiting.       Felt nausea as soon as aches came on, but is not as bad right now and got better after drinking water.   Genitourinary: Positive for frequency. Negative for dysuria, flank pain and urgency.       Has strong urine culture.   Musculoskeletal: Positive for myalgias and neck pain. Negative  for gait problem.       Sides of neck are aching.   Skin: Negative for rash.  Neurological: Positive for dizziness, light-headedness and headaches.       Gets light headed off and on  Hematological: Negative for adenopathy.   Physical Exam Triage Vital Signs ED Triage Vitals  Enc Vitals Group     BP 04/03/18 1036 137/84     Pulse Rate 04/03/18 1036 (!) 108     Resp 04/03/18 1036 15     Temp 04/03/18 1036 (!) 102.8 F (39.3 C)     Temp Source 04/03/18 1036 Oral     SpO2 04/03/18 1036 98 %     Weight --      Height --      Head Circumference --      Peak Flow --      Pain Score 04/03/18 1040 10     Pain Loc --      Pain Edu? --      Excl. in [place]? --    No data found.  Updated Vital Signs BP 137/84 (BP Location: Right Arm)   Pulse (!) 108   Temp (!) 102.8 F (39.3 C) (Oral)   Resp 15   SpO2 98%   Visual Acuity Right Eye Distance:   Left Eye Distance:   Bilateral Distance:    Right Eye Near:   Left Eye Near:    Bilateral Near:     Physical Exam Vitals signs and nursing note reviewed.  Constitutional:      Appearance: She is ill-appearing. She is not toxic-appearing or diaphoretic.  HENT:     Head: Normocephalic.     Right Ear: Tympanic membrane, ear canal and external ear normal.     Left Ear: Tympanic membrane, ear canal and external ear normal.     Nose: Rhinorrhea present. No congestion.     Comments: Clear nose mucous    Mouth/Throat:     Mouth: Mucous membranes are moist.     Pharynx: No oropharyngeal exudate or posterior oropharyngeal erythema.  Eyes:     General: No scleral icterus.  Right eye: No discharge.        Left eye: No discharge.     Extraocular Movements: Extraocular movements intact.     Conjunctiva/sclera: Conjunctivae normal.     Pupils: Pupils are equal, round, and reactive to light.  Neck:     Musculoskeletal: Neck supple. Muscular tenderness present. No neck rigidity.     Comments: Has bilateral sternocleidomastoid muscle  tenderness. Neg meningeal signs Cardiovascular:     Rate and Rhythm: Normal rate and regular rhythm.     Heart sounds: No murmur.  Pulmonary:     Effort: Pulmonary effort is normal. No respiratory distress.     Breath sounds: No wheezing, rhonchi or rales.  Abdominal:     General: Abdomen is flat. Bowel sounds are normal. There is no distension.     Palpations: There is no mass.     Tenderness: There is no abdominal tenderness. There is no right CVA tenderness, left CVA tenderness, guarding or rebound.  Musculoskeletal: Normal range of motion.     Comments: Has local tenderness on R lumbar muscular region.   Lymphadenopathy:     Cervical: No cervical adenopathy.  Skin:    General: Skin is warm and dry.     Findings: No rash.  Neurological:     Mental Status: She is alert and oriented to person, place, and time.     Motor: No weakness.     Coordination: Coordination normal.     Gait: Gait normal.  Psychiatric:        Mood and Affect: Mood normal.        Behavior: Behavior normal.        Thought Content: Thought content normal.        Judgment: Judgment normal.      UC Treatments / Results  Labs (all labs ordered are listed, but only abnormal results are displayed) Labs Reviewed  POCT URINALYSIS DIP (DEVICE) - Abnormal; Notable for the following components:      Result Value   Hgb urine dipstick TRACE (*)    All other components within normal limits    EKG None  Radiology No results found.  Procedures Procedures   Medications Ordered in UC Medications  acetaminophen (TYLENOL) tablet 650 mg (650 mg Oral Given 04/03/18 1046)    Initial Impression / Assessment and Plan / UC Course  I have reviewed the triage vital signs and the nursing notes. Pertinent labs  results that were available during my care of the patient were reviewed by me and considered in my medical decision making (see chart for details). I explained to pt we do not have flu test, but since her  Pulse ox is normal and lungs are clear I suspect influenza. I placed her on Tamiflu 75 mg bid x 5 days. See instructions.  If she gets worse in 24-48 h needs to be seen.     Final Clinical Impressions(s) / UC Diagnoses   Final diagnoses:  Flu-like symptoms     Discharge Instructions     Your urine showed trace blood but no signs of infection.   Push fluids and take any over the counter medication to help your symtoms. I will place you on Tamiflu for the Flu and usually by the 3rd dose you will start feeling better. You may not go to work until you do not have a fever for 24h.   If you get worse in 24-48h go to ER to have labs and chest xray and check  for secondary infections.     ED Prescriptions    Medication Sig Dispense Auth. Provider   oseltamivir (TAMIFLU) 75 MG capsule Take 1 capsule (75 mg total) by mouth every 12 (twelve) hours. 10 capsule Rodriguez-Southworth, Sunday Spillers, PA-C     Controlled Substance Prescriptions [place] Controlled Substance Registry consulted?    Shelby Mattocks, [place] 04/04/18 1953

## 2018-04-03 NOTE — ED Provider Notes (Signed)
[facility] EMERGENCY DEPARTMENT Provider Note   CSN: [id number] Arrival date & time: 04/03/18  2241     History   Chief Complaint Chief Complaint  Patient presents with  . Generalized Body Aches    HPI [name] is a 68 y.o. female.  Patient presents with a 1 day history of diffuse body aches, headache, chills, fever to 103.  She was seen in urgent care about 12 hours ago and diagnosed clinically with influenza.  She was given 1 dose of Tamiflu which she promptly vomited.  This evening she is had 6 or 7 episodes of nonbloody nonbilious emesis which is new since this morning.  No diarrhea.  No abdominal pain.  She called EMS because she could not keep anything down.  She lacerated take Tylenol around 7 PM and vomited this out.  She still feels that her "bones hurt all over" and feels sore and achy with diffuse headache, sore throat and runny nose.  Also has had a nonproductive cough.  No abdominal pain or chest pain.  No significant shortness of breath.  No sick contacts or other country travel.  She has not had any flu exposures but did not receive a flu shot.  The history is provided by the patient and the EMS personnel.    Past Medical History:  Diagnosis Date  . Chronic kidney disease    stage 3  . GERD (gastroesophageal reflux disease)   . Hyperlipidemia   . Migraine headache   . Occipital neuralgia    Left  . Renal insufficiency   . Stroke [facility]) 10/2013    Patient Active Problem List   Diagnosis Date Noted  . Plantar fasciitis 01/10/2018  . Chronic anxiety 01/08/2018  . Gastroesophageal reflux disease 01/08/2018  . Hemoglobin low 01/08/2018  . Menopausal syndrome 01/08/2018  . Patellar contusion, initial encounter 11/07/2017  . Arthralgia of right temporomandibular joint 09/28/2017  . Leg swelling 08/24/2017  . Positive D-dimer   . Right leg pain   . Pulmonary embolism ([place])   . Coronary artery calcification   . Dyspnea 07/26/2017  .  Sinus infection 04/18/2017  . Osteoarthritis of knee 04/08/2017  . Right ear pain 01/02/2017  . Easy bruising 11/10/2016  . Hospital discharge follow-up 01/25/2016  . Pneumonia 01/14/2016  . SOB (shortness of breath)   . Difficulty in walking, not elsewhere classified   . Diarrhea of presumed infectious origin   . LFT elevation   . Hypokalemia   . Enteritis due to Clostridium difficile   . Pleural effusion on right   . Bilateral pleural effusion   . Biloma   . Biloma following surgery   . Status post cholecystectomy   . Infection by Streptococcus, viridans group   . Bile leak, postoperative   . Sepsis ([place]) 12/11/2015  . Symptomatic cholelithiasis 12/05/2015  . Hyperlipidemia LDL goal <70 04/02/2015  . Chronic migraine without aura, with status migrainosus 09/26/2014  . Dizziness 11/09/2013  . Adjustment disorder with mixed anxiety and depressed mood 11/03/2013  . Migraine variant 10/10/2013  . Disturbance of skin sensation 10/10/2013  . TIA (transient ischemic attack) 10/10/2013  . HTN (hypertension) 07/18/2013  . 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 Neuralgia, neuritis, and radiculitis, unspecified 02/04/2012  . 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 Myofascial muscle pain 07/15/2011    Past Surgical History:  Procedure Laterality Date  . ABDOMINAL HYSTERECTOMY    . BILIARY STENT PLACEMENT N/A 12/12/2015   Procedure: BILIARY STENT PLACEMENT;  Surgeon: Doran Stabler, MD;  Location: [facility] ENDOSCOPY;  Service: Endoscopy;  Laterality: N/A;  . BLADDER REPAIR    . CARPAL TUNNEL RELEASE    . CERVICAL SPINE SURGERY    . CHOLECYSTECTOMY N/A 12/06/2015   Procedure: LAPAROSCOPIC CHOLECYSTECTOMY WITH  INTRAOPERATIVE CHOLANGIOGRAM;  Surgeon: Stark Klein, MD;  Location: [place];  Service: General;  Laterality: N/A;  . ELBOW SURGERY    . ERCP N/A 12/12/2015   Procedure: ENDOSCOPIC RETROGRADE CHOLANGIOPANCREATOGRAPHY (ERCP);  Surgeon: Doran Stabler, MD;  Location: [facility] ENDOSCOPY;   Service: Endoscopy;  Laterality: N/A;  . ESOPHAGOGASTRODUODENOSCOPY N/A 01/28/2016   Procedure: ESOPHAGOGASTRODUODENOSCOPY (EGD) Biliary STENT removal;  Surgeon: Doran Stabler, MD;  Location: [facility] ENDOSCOPY;  Service: Gastroenterology;  Laterality: N/A;  . IR GENERIC HISTORICAL  12/17/2015   IR US GUIDE BX ASP/DRAIN 12/17/2015 [facility]-INTERV RAD  . IR GENERIC HISTORICAL  12/17/2015   IR GUIDED DRAIN W CATHETER PLACEMENT 12/17/2015 [facility]  . IR GENERIC HISTORICAL  12/17/2015   IR SINUS/FIST TUBE CHK-NON GI 12/17/2015 [facility]INTER[facility]  . KNEE SURGERY    . LUNG SURGERY    . TEE WITHOUT CARDIOVERSION N/A 12/19/2015   Procedure: TRANSESOPHAGEAL ECHOCARDIOGRAM (TEE);  Surgeon: Josue Hector, MD;  Location: [facility] ENDOSCOPY;  Service: Cardiovascular;  Laterality: N/A;     OB History   No obstetric history on file.      Home Medications    Prior to Admission medications   Medication Sig Start Date End Date Taking? Authorizing Provider  atorvastatin (LIPITOR) 40 MG tablet atorvastatin 40 mg tablet  TAKE 1 TABLET BY MOUTH EVERY DAY    [provider]  baclofen (LIORESAL) 10 MG tablet baclofen 10 mg tablet    [provider]  Besifloxacin HCl (BESIVANCE) 0.6 % SUSP Besivance 0.6 % eye drops,suspension  1 DROP IN RIGHT EYE THREE TIMES A DAY FOR 30 DAYS    [provider]  clidinium-chlordiazePOXIDE (LIBRAX) 5-2.5 MG capsule chlordiazepoxide-clidinium 5 mg-2.5 mg capsule  TAKE AS DIRECTED    [provider]  clopidogrel (PLAVIX) 75 MG tablet TAKE 1 TABLET BY MOUTH DAILY 08/27/16   McKeag, Marylynn Pearson, MD  Difluprednate (DUREZOL) 0.05 % EMUL Durezol 0.05 % eye drops  1 DROP IN RIGHT EYE THREE TIMES A DAY FOR 30 DAYS    [provider]  doxepin (SINEQUAN) 100 MG capsule Take 2 capsules (200 mg total) by mouth at bedtime. 10/15/14   Kathrynn Ducking, MD  esomeprazole (NEXIUM) 20 MG capsule Take 1 capsule (20 mg total) by mouth daily. 01/04/18   Lovenia Kim, MD    ezetimibe (ZETIA) 10 MG tablet ezetimibe 10 mg tablet    [provider]  fluticasone (FLONASE) 50 MCG/ACT nasal spray fluticasone propionate 50 mcg/actuation nasal spray,suspension  PLACE 2 SPRAYS INTO BOTH NOSTRILS DAILY.    [provider]  Fremanezumab-vfrm (AJOVY) 225 MG/1.5ML SOSY Inject 225 mg into the skin every 30 (thirty) days. 01/04/18   Ward Givens, NP  furosemide (LASIX) 40 MG tablet Take 40 mg by mouth daily. 07/24/17   [provider]  gabapentin (NEURONTIN) 600 MG tablet TAKE 1 TABLET BY MOUTH TWICE DAILY 07/27/17   Ward Givens, NP  ketorolac (TORADOL) 10 MG tablet ketorolac 10 mg tablet  TAKE 1 TABLET 3 TIMES DAILY    [provider]  lidocaine (LIDODERM) 5 % Place 2 patches onto the skin daily. Apply to 2 patches to affected area in the AM, then remove after 12  hours 03/15/18   Kirsteins, Luanna Salk, MD  meclizine (ANTIVERT) 25 MG tablet Take 1 tablet (25 mg total) by mouth 2 (two) times daily as needed for dizziness. 07/16/15   McKeag, Marylynn Pearson, MD  metoCLOPramide (REGLAN) 5 MG tablet metoclopramide 5 mg tablet  TAKE 1 TABLET 3 TIMES DAILY.    [provider]  nepafenac (ILEVRO) 0.3 % ophthalmic suspension Ilevro 0.3 % eye drops,suspension  1 DROP IN RIGHT EYE AT BEDTIME FOR 30 DAYS    [provider]  oseltamivir (TAMIFLU) 75 MG capsule Take 1 capsule (75 mg total) by mouth every 12 (twelve) hours. 04/03/18   Rodriguez-Southworth, Sunday Spillers, PA-C  oxyCODONE-acetaminophen (PERCOCET/ROXICET) 5-325 MG tablet oxycodone-acetaminophen 5 mg-325 mg tablet  TK 1 T PO Q 6 H    [provider]  propranolol (INDERAL) 10 MG tablet propranolol 10 mg tablet  TAKE 1 TABLET BY MOUTH TWICE A DAY    [provider]  ramipril (ALTACE) 5 MG capsule Take 1 capsule (5 mg total) by mouth daily. 11/20/17   Skeet Latch, MD  rosuvastatin (CRESTOR) 10 MG tablet Take 5mg  every Monday, Wednesday and Friday 12/09/17   Skeet Latch,  MD  tiZANidine (ZANAFLEX) 2 MG tablet Take 1 tablet (2 mg total) by mouth 3 (three) times daily. 07/01/17   Ward Givens, NP  topiramate (TOPAMAX) 100 MG tablet topiramate 100 mg tablet  TAKE 1 TABLET (100 MG TOTAL) BY MOUTH 2 (TWO) TIMES DAILY.    [provider]  zonisamide (ZONEGRAN) 100 MG capsule Take 1 capsule (100 mg total) by mouth 2 (two) times daily. 07/01/17   Ward Givens, NP    Family History Family History  Problem Relation Age of Onset  . Cancer Mother 56       breast  . Alzheimer's disease Mother   . Cancer Brother   . Hyperlipidemia Brother   . Prostate cancer Father   . Lung cancer Father   . Cancer Brother   . Hyperlipidemia Brother   . Dementia Brother        frontotemporal dementia    Social History Social History   Tobacco Use  . Smoking status: Former Smoker    Packs/day: 0.30    Types: Cigarettes    Last attempt to quit: 11/01/2011    Years since quitting: 6.4  . Smokeless tobacco: Never Used  Substance Use Topics  . Alcohol use: No    Alcohol/week: 0.0 standard drinks  . Drug use: No     Allergies   Iodinated diagnostic agents; Shellfish allergy; and Statins   Review of Systems Review of Systems  Constitutional: Positive for activity change, appetite change, chills and fever.  HENT: Positive for congestion, rhinorrhea and sore throat.   Respiratory: Positive for cough.   Cardiovascular: Negative for chest pain.  Gastrointestinal: Positive for nausea and vomiting. Negative for abdominal pain.  Genitourinary: Negative for dysuria and hematuria.  Musculoskeletal: Positive for arthralgias and myalgias.  Neurological: Positive for weakness and headaches.    all other systems are negative except as noted in the HPI and PMH.    Physical Exam Updated Vital Signs BP (!) 150/84 (BP Location: Right Arm)   Pulse (!) 110   Temp (!) 103.1 F (39.5 C) (Oral)   Resp (!) 23   Ht 5\' 7"  (1.702 m)   Wt 71.2 kg   SpO2 93%   BMI 24.59  kg/m   Physical Exam Vitals signs and nursing note reviewed.  Constitutional:  General: She is not in acute distress.    Appearance: She is well-developed. She is ill-appearing. She is not toxic-appearing.  HENT:     Head: Normocephalic and atraumatic.     Right Ear: Tympanic membrane normal.     Left Ear: Tympanic membrane normal.     Nose: Congestion and rhinorrhea present.     Mouth/Throat:     Mouth: Mucous membranes are moist.     Pharynx: No oropharyngeal exudate or posterior oropharyngeal erythema.  Eyes:     Conjunctiva/sclera: Conjunctivae normal.     Pupils: Pupils are equal, round, and reactive to light.  Neck:     Musculoskeletal: Normal range of motion and neck supple. No neck rigidity.     Comments: No meningismus. Cardiovascular:     Rate and Rhythm: Normal rate and regular rhythm.     Heart sounds: Normal heart sounds. No murmur.  Pulmonary:     Effort: Pulmonary effort is normal. No respiratory distress.     Breath sounds: No wheezing.  Abdominal:     Palpations: Abdomen is soft.     Tenderness: There is no abdominal tenderness. There is no guarding or rebound.  Musculoskeletal: Normal range of motion.        General: No tenderness.  Skin:    General: Skin is warm.     Capillary Refill: Capillary refill takes less than 2 seconds.  Neurological:     Mental Status: She is alert and oriented to person, place, and time.     Cranial Nerves: No cranial nerve deficit.     Motor: No abnormal muscle tone.     Coordination: Coordination normal.     Comments: No ataxia on finger to nose bilaterally. No pronator drift. 5/5 strength throughout. CN 2-12 intact.Equal grip strength. Sensation intact.   Psychiatric:        Behavior: Behavior normal.      ED Treatments / Results  Labs (all labs ordered are listed, but only abnormal results are displayed) Labs Reviewed  COMPREHENSIVE METABOLIC PANEL - Abnormal; Notable for the following components:      Result  Value   Glucose, Bld 138 (*)    Creatinine, Ser 1.45 (*)    Calcium 8.6 (*)    Total Protein 6.4 (*)    AST 42 (*)    GFR calc non Af Amer 37 (*)    GFR calc Af Amer 43 (*)    All other components within normal limits  CBC - Abnormal; Notable for the following components:   Platelets 140 (*)    All other components within normal limits  URINALYSIS, ROUTINE W REFLEX MICROSCOPIC - Abnormal; Notable for the following components:   APPearance HAZY (*)    Hgb urine dipstick SMALL (*)    Nitrite POSITIVE (*)    Leukocytes, UA LARGE (*)    WBC, UA >50 (*)    Bacteria, UA RARE (*)    All other components within normal limits  LACTIC ACID, PLASMA - Abnormal; Notable for the following components:   Lactic Acid, Venous 2.1 (*)    All other components within normal limits  CULTURE, BLOOD (ROUTINE X 2)  CULTURE, BLOOD (ROUTINE X 2)  URINE CULTURE  LIPASE, BLOOD  LACTIC ACID, PLASMA  INFLUENZA PANEL BY PCR (TYPE A & B)    EKG None  Radiology Dg Chest 2 View  Result Date: 04/04/2018 CLINICAL DATA:  Cough and fever EXAM: CHEST - 2 VIEW COMPARISON:  None. FINDINGS: The heart  size and mediastinal contours are within normal limits. Aortic atherosclerosis at the arch with slight uncoiling of the thoracic aorta. No aneurysm is identified. Both lungs are clear. ACDF of the lower cervical spine with bilateral multilevel degenerative facet arthropathy. The visualized skeletal structures are unremarkable. IMPRESSION: No active cardiopulmonary disease. Aortic atherosclerosis. Electronically Signed   By: Ashley Royalty M.D.   On: 04/04/2018 00:26    Procedures Procedures (including critical care time)  Medications Ordered in ED Medications  ondansetron (ZOFRAN) injection 4 mg (has no administration in time range)  sodium chloride flush (NS) 0.9 % injection 3 mL (has no administration in time range)  ondansetron (ZOFRAN) injection 4 mg (has no administration in time range)  sodium chloride 0.9 %  bolus 1,000 mL (has no administration in time range)  ketorolac (TORADOL) 30 MG/ML injection 15 mg (has no administration in time range)  acetaminophen (TYLENOL) tablet 650 mg (has no administration in time range)     Initial Impression / Assessment and Plan / ED Course  I have reviewed the triage vital signs and the nursing notes.  Pertinent labs & imaging results that were available during my care of the patient were reviewed by me and considered in my medical decision making (see chart for details).    1 day of body aches, fever, chills, headache, rhinorrhea now with nausea and vomiting.  No abdominal pain.  Patient febrile and tachycardic on arrival.  She was given IV fluids and antipyretics.  With patient's history of CKD, will need to reduce her Tamiflu dose to 30 mg twice daily. Creatinine today is stable.  Given IV fluids and symptom control.  Chest x-ray is negative.  Abdomen is soft and nontender.  Suspect influenza-like syndrome even though flu swab is negative.  She has no meningismus.  She has body aches, chills, fever, congestion, headache, sore throat which is consistent with influenza-like illness. Meningitis seems less likely.  Discussed with patient.  She agrees and does not want to pursue lumbar puncture at this time.  Blood pressure remains low despite IV fluids.  Mental status remained stable.  No meningismus.  Urinalysis is consistent with infection and culture sent.  IV Rocephin given.  Patient source is likely urinary tract infection causing sepsis with possible influenza-like syndrome in addition.  Her lactate remains elevated and blood pressure slightly low and she feels that she will not be able to do well at home and is unable to tolerate p.o.  Observation admission discussed with family practice residents.  CRITICAL CARE Performed by: Ezequiel Essex Total critical care time: 35 minutes Critical care time was exclusive of separately billable procedures and  treating other patients. Critical care was necessary to treat or prevent imminent or life-threatening deterioration. Critical care was time spent personally by me on the following activities: development of treatment plan with patient and/or surrogate as well as nursing, discussions with consultants, evaluation of patient's response to treatment, examination of patient, obtaining history from patient or surrogate, ordering and performing treatments and interventions, ordering and review of laboratory studies, ordering and review of radiographic studies, pulse oximetry and re-evaluation of patient's condition.  Final Clinical Impressions(s) / ED Diagnoses   Final diagnoses:  Influenza-like illness  Urinary tract infection without hematuria, site unspecified    ED Discharge Orders    None       Lanisa Ishler, Annie Main, MD 04/04/18 [id number]

## 2018-04-03 NOTE — ED Triage Notes (Signed)
The patient presented to the [facility] with a complaint of general body aches and fever that started this am.

## 2018-04-03 NOTE — Discharge Instructions (Addendum)
Your urine showed trace blood but no signs of infection.   Push fluids and take any over the counter medication to help your symtoms. I will place you on Tamiflu for the Flu and usually by the 3rd dose you will start feeling better. You may not go to work until you do not have a fever for 24h.   If you get worse in 24-48h go to ER to have labs and chest xray and check for secondary infections.

## 2018-04-04 ENCOUNTER — Emergency Department (HOSPITAL_COMMUNITY): Payer: Medicare Other

## 2018-04-04 ENCOUNTER — Observation Stay (HOSPITAL_COMMUNITY): Payer: Medicare Other

## 2018-04-04 ENCOUNTER — Encounter (HOSPITAL_COMMUNITY): Payer: Self-pay | Admitting: Internal Medicine

## 2018-04-04 DIAGNOSIS — M545 Low back pain: Secondary | ICD-10-CM | POA: Diagnosis not present

## 2018-04-04 DIAGNOSIS — R69 Illness, unspecified: Secondary | ICD-10-CM

## 2018-04-04 DIAGNOSIS — N390 Urinary tract infection, site not specified: Secondary | ICD-10-CM | POA: Diagnosis not present

## 2018-04-04 DIAGNOSIS — R109 Unspecified abdominal pain: Secondary | ICD-10-CM | POA: Diagnosis not present

## 2018-04-04 DIAGNOSIS — R52 Pain, unspecified: Secondary | ICD-10-CM

## 2018-04-04 DIAGNOSIS — M25551 Pain in right hip: Secondary | ICD-10-CM | POA: Diagnosis not present

## 2018-04-04 DIAGNOSIS — R05 Cough: Secondary | ICD-10-CM | POA: Diagnosis not present

## 2018-04-04 LAB — URINALYSIS, ROUTINE W REFLEX MICROSCOPIC
Bilirubin Urine: NEGATIVE
Glucose, UA: NEGATIVE mg/dL
Ketones, ur: NEGATIVE mg/dL
Nitrite: POSITIVE — AB
Protein, ur: NEGATIVE mg/dL
Specific Gravity, Urine: 1.008 (ref 1.005–1.030)
WBC, UA: >50 WBC/hpf — ABNORMAL HIGH (ref 0–5)
pH: 6 (ref 5.0–8.0)

## 2018-04-04 LAB — CBC
HCT: 41.4 % (ref 36.0–46.0)
Hemoglobin: 12.9 g/dL (ref 12.0–15.0)
MCH: 27.6 pg (ref 26.0–34.0)
MCHC: 31.2 g/dL (ref 30.0–36.0)
MCV: 88.7 fL (ref 80.0–100.0)
Platelets: 140 10*3/uL — ABNORMAL LOW (ref 150–400)
RBC: 4.67 MIL/uL (ref 3.87–5.11)
RDW: 13.9 % (ref 11.5–15.5)
WBC: 9.1 10*3/uL (ref 4.0–10.5)
nRBC: 0 % (ref 0.0–0.2)

## 2018-04-04 LAB — BLOOD CULTURE ID PANEL (REFLEXED)

## 2018-04-04 LAB — COMPREHENSIVE METABOLIC PANEL
ALT: 35 U/L (ref 0–44)
AST: 42 U/L — ABNORMAL HIGH (ref 15–41)
Albumin: 3.8 g/dL (ref 3.5–5.0)
Alkaline Phosphatase: 90 U/L (ref 38–126)
Anion gap: 10 (ref 5–15)
BUN: 18 mg/dL (ref 8–23)
CO2: 23 mmol/L (ref 22–32)
Calcium: 8.6 mg/dL — ABNORMAL LOW (ref 8.9–10.3)
Chloride: 104 mmol/L (ref 98–111)
Creatinine, Ser: 1.45 mg/dL — ABNORMAL HIGH (ref 0.44–1.00)
GFR calc Af Amer: 43 mL/min — ABNORMAL LOW (ref >60)
GFR calc non Af Amer: 37 mL/min — ABNORMAL LOW (ref >60)
Glucose, Bld: 138 mg/dL — ABNORMAL HIGH (ref 70–99)
Potassium: 3.5 mmol/L (ref 3.5–5.1)
Sodium: 137 mmol/L (ref 135–145)
Total Bilirubin: 1 mg/dL (ref 0.3–1.2)
Total Protein: 6.4 g/dL — ABNORMAL LOW (ref 6.5–8.1)

## 2018-04-04 LAB — INFLUENZA PANEL BY PCR (TYPE A & B)
Influenza A By PCR: NEGATIVE
Influenza B By PCR: NEGATIVE

## 2018-04-04 LAB — LACTIC ACID, PLASMA
Lactic Acid, Venous: 0.9 mmol/L (ref 0.5–1.9)
Lactic Acid, Venous: 2.1 mmol/L (ref 0.5–1.9)

## 2018-04-04 LAB — SEDIMENTATION RATE: Sed Rate: 28 mm/hr — ABNORMAL HIGH (ref 0–22)

## 2018-04-04 LAB — LIPASE, BLOOD: Lipase: 30 U/L (ref 11–51)

## 2018-04-04 MED ORDER — MORPHINE SULFATE (PF) 2 MG/ML IV SOLN
2.0000 mg | Freq: Once | INTRAVENOUS | Status: AC
  Administered 2018-04-04: 2 mg via INTRAVENOUS
  Filled 2018-04-04: qty 1

## 2018-04-04 MED ORDER — MORPHINE SULFATE (PF) 2 MG/ML IV SOLN
2.0000 mg | INTRAVENOUS | Status: DC | PRN
  Administered 2018-04-04 – 2018-04-05 (×2): 2 mg via INTRAVENOUS
  Filled 2018-04-04 (×2): qty 1

## 2018-04-04 MED ORDER — OXYCODONE HCL 5 MG PO TABS
5.0000 mg | ORAL_TABLET | Freq: Once | ORAL | Status: AC
  Administered 2018-04-04: 5 mg via ORAL
  Filled 2018-04-04: qty 1

## 2018-04-04 MED ORDER — SODIUM CHLORIDE 0.9 % IV SOLN: 1.0000 g | Freq: Once | INTRAVENOUS | Status: DC

## 2018-04-04 MED ORDER — ONDANSETRON HCL 4 MG PO TABS
4.0000 mg | ORAL_TABLET | Freq: Four times a day (QID) | ORAL | Status: DC | PRN
  Administered 2018-04-06: 4 mg via ORAL
  Filled 2018-04-04: qty 1

## 2018-04-04 MED ORDER — ZONISAMIDE 100 MG PO CAPS
100.0000 mg | ORAL_CAPSULE | Freq: Two times a day (BID) | ORAL | Status: DC
  Filled 2018-04-04: qty 1

## 2018-04-04 MED ORDER — EZETIMIBE 10 MG PO TABS
10.0000 mg | ORAL_TABLET | Freq: Every day | ORAL | Status: DC
  Administered 2018-04-04 – 2018-04-08 (×5): 10 mg via ORAL
  Filled 2018-04-04 (×5): qty 1

## 2018-04-04 MED ORDER — TOPIRAMATE 100 MG PO TABS
100.0000 mg | ORAL_TABLET | Freq: Two times a day (BID) | ORAL | Status: DC
  Administered 2018-04-04 – 2018-04-05 (×3): 100 mg via ORAL
  Filled 2018-04-04 (×3): qty 1

## 2018-04-04 MED ORDER — CLOPIDOGREL BISULFATE 75 MG PO TABS
75.0000 mg | ORAL_TABLET | Freq: Every day | ORAL | Status: DC
  Administered 2018-04-04 – 2018-04-08 (×5): 75 mg via ORAL
  Filled 2018-04-04 (×5): qty 1

## 2018-04-04 MED ORDER — SODIUM CHLORIDE 0.9 % IV SOLN
2.0000 g | INTRAVENOUS | Status: DC
  Administered 2018-04-04 – 2018-04-05 (×2): 2 g via INTRAVENOUS
  Filled 2018-04-04 (×2): qty 20

## 2018-04-04 MED ORDER — ACETAMINOPHEN 650 MG RE SUPP: 650.0000 mg | Freq: Four times a day (QID) | RECTAL | Status: DC | PRN

## 2018-04-04 MED ORDER — ONDANSETRON HCL 4 MG/2ML IJ SOLN
4.0000 mg | Freq: Once | INTRAMUSCULAR | Status: AC
  Administered 2018-04-04: 4 mg via INTRAVENOUS
  Filled 2018-04-04: qty 2

## 2018-04-04 MED ORDER — SODIUM CHLORIDE 0.9 % IV SOLN
INTRAVENOUS | Status: DC
  Administered 2018-04-04 – 2018-04-05 (×4): via INTRAVENOUS

## 2018-04-04 MED ORDER — ACETAMINOPHEN 325 MG PO TABS
650.0000 mg | ORAL_TABLET | Freq: Four times a day (QID) | ORAL | Status: DC | PRN
  Filled 2018-04-04 (×2): qty 2

## 2018-04-04 MED ORDER — KETOROLAC TROMETHAMINE 30 MG/ML IJ SOLN
15.0000 mg | Freq: Once | INTRAMUSCULAR | Status: AC
  Administered 2018-04-04: 15 mg via INTRAVENOUS
  Filled 2018-04-04: qty 1

## 2018-04-04 MED ORDER — SODIUM CHLORIDE 0.9 % IV BOLUS
1000.0000 mL | Freq: Once | INTRAVENOUS | Status: AC
  Administered 2018-04-04: 1000 mL via INTRAVENOUS

## 2018-04-04 MED ORDER — DIPHENHYDRAMINE HCL 50 MG/ML IJ SOLN
12.5000 mg | Freq: Once | INTRAMUSCULAR | Status: AC
  Administered 2018-04-04: 12.5 mg via INTRAVENOUS
  Filled 2018-04-04: qty 1

## 2018-04-04 MED ORDER — PANTOPRAZOLE SODIUM 40 MG PO TBEC
40.0000 mg | DELAYED_RELEASE_TABLET | Freq: Every day | ORAL | Status: DC
  Administered 2018-04-04 – 2018-04-08 (×5): 40 mg via ORAL
  Filled 2018-04-04 (×5): qty 1

## 2018-04-04 MED ORDER — ONDANSETRON HCL 4 MG/2ML IJ SOLN
4.0000 mg | Freq: Four times a day (QID) | INTRAMUSCULAR | Status: DC | PRN
  Administered 2018-04-04 – 2018-04-08 (×4): 4 mg via INTRAVENOUS
  Filled 2018-04-04 (×6): qty 2

## 2018-04-04 NOTE — Discharge Summary (Addendum)
[facility] Discharge Summary  Patient name: [name] Medical record number: [id number] Date of birth: [birth year] Age: 68 y.o. Gender: female Date of Admission: 04/03/2018  Date of Discharge: 04/08/2018 Admitting Physician: Dickie La, MD  Primary Care Provider: Lovenia Kim, MD Consultants: None  Indication for Hospitalization: Tachycardia and Hypotension  Discharge Diagnoses/Problem List:  Bacteremia, likely urine source Migraine H/o Pe H/o TIA HLD HTN CKD IIIb  Disposition: Home  Discharge Condition: Improved, Stable  Discharge Exam:  General: well nourished, well developed, in no acute distress with non-toxic appearance, lying comfortably in bed HEENT: normocephalic, atraumatic, moist mucous membranes Neck: supple, full ROM CV: regular rate and rhythm without murmurs, rubs, or gallops, no lower extremity edema Lungs: fine crackles in left lower lobe otherwise clear to auscultation bilaterally without wheezes and normal work of breathing Abdomen: soft, non-tender, non-distended, normoactive bowel sounds Skin: warm, dry, no rashes or lesions Extremities: warm and well perfused, normal tone Neuro: Alert and oriented, speech normal  Brief Hospital Course:  [name] is a 68 y.o. female with past medical history significant for anxiety, migraines, hypertension, hyperlipidemia, TIA, pulmonary embolism, who presented with tachycardia and hypotension and 1 day history of diffuse, body aches, headaches, chills, and fevers. Flu negative. Although CT abdomen/pelvis was negative, she was suspected to have pyelonephritis given clinical findings of right flank pain and fever, urine culture positive for >100K E.coli, and E. Coli bacteremia. She was initially started on IV Rocephin but was transitioned to PO Bactrim to complete a 14-day course following return of sensitivities. Throughout admission patient remained hemodynamically stable and  afebrile. She initially received maintenance IV fluids, but as her nausea and vomiting improved, they were discontinued.  Her right flank pain improved daily and was well controlled with tramadol and lidocaine patch.  She initially had lactic acidosis which improved upon 2L fluid resuscitation in ED. On day of discharge, her symptoms were improved, she was tolerating PO, and hemodynamically stable. Return precautions were discussed and close follow up arranged.   Chronic Migraine: Patient has history of chronic migraines. She was continued on her home Tizanidine, Zonegran, and Doxipen. Propranolol was held given soft blood pressures. Restarting of medication was deferred to PCP.   Right back/pelvic pain: Patient complained of right lower back pain and pelvic pain. It was felt to be secondary to acute pyelonephritis, however further work up to exclude other causes were ordered.  X-ray of the hip was significant for mild degenerative changes in the hips left greater than right, otherwise negative for fracture or dislocation.  Imaging of the lumbar spine with chronic compression fracture of L1 and degenerative changes, otherwise negative and CT abdomen and pelvis with similar above findings.  Patient's pain was managed with tramadol and lidocaine patch.  Cough: On day of discharge, patient was noted to have new onset wet cough and left lobe crackles. CXR was obtained and was negative for pneumonia, with findings consistent with viral bronchitis. Patient was discharged with close follow up arranged.    Diarrhea: Patient noted soft stools for 2-3 days during admission. Given recent C. Diff infection, patient was very concerned. C. Diff was obtained and was negative. Diarrhea improved with imodium and probiotics. Was felt to be secondary to antibiotics.   Issues for Follow Up:  1. Patient still on Plavix for history of TIA - recommend further discussion with PCP concerning benefit/necessity of Plavix moving  forward 2. Recheck BMP to follow kidney function while on Bactrim  3. Soft stools likely secondary to antibiotic - please ensure symptoms are not worsening 4. Please monitor fluid status and resume lasix if indicated 5. Please ensure resolution of diarrhea s/p completion of antibiotics   Significant Procedures: CT abdomen/pelvis, Hip x-ray  Significant Labs and Imaging:  Recent Labs  Lab 04/06/18 0404 04/07/18 0136 04/08/18 0834  WBC 4.8 4.9 4.1  HGB 10.8* 10.5* 10.9*  HCT 33.9* 32.4* 34.0*  PLT 134* 155 179   Recent Labs  Lab 04/03/18 2319 04/05/18 0242 04/06/18 0404 04/07/18 0136 04/08/18 0834  NA 137 141 140 138 140  K 3.5 3.8 3.7 3.5 3.8  CL 104 113* 110 110 112*  CO2 23 23 21* 21* 21*  GLUCOSE 138* 113* 99 116* 100*  BUN 18 8 6* 9 8  CREATININE 1.45* 1.17* 1.02* 1.22* 1.17*  CALCIUM 8.6* 7.7* 7.9* 8.3* 8.5*  ALKPHOS 90 66  --   --   --   AST 42* 34  --   --   --   ALT 35 30  --   --   --   ALBUMIN 3.8 2.8*  --   --   --    ESR: 28 Lactic acid: 2.1>0.9 Flu A/B: neg Blood culture ID: E.coli  Lipase: 30 C. Diff: negative  Urinalysis    Component Value Date/Time   COLORURINE YELLOW 04/04/2018 0305   APPEARANCEUR HAZY (A) 04/04/2018 0305   LABSPEC 1.008 04/04/2018 0305   PHURINE 6.0 04/04/2018 0305   GLUCOSEU NEGATIVE 04/04/2018 0305   HGBUR SMALL (A) 04/04/2018 0305   BILIRUBINUR NEGATIVE 04/04/2018 0305   BILIRUBINUR neg 07/18/2013 1427   KETONESUR NEGATIVE 04/04/2018 0305   PROTEINUR NEGATIVE 04/04/2018 0305   UROBILINOGEN 0.2 04/03/2018 1131   NITRITE POSITIVE (A) 04/04/2018 0305   LEUKOCYTESUR LARGE (A) 04/04/2018 0305   Dg Chest 2 View  Result Date: 04/08/2018 CLINICAL DATA:  Cough for 2 days, fever yesterday, history stroke, stage III chronic kidney disease EXAM: CHEST - 2 VIEW COMPARISON:  04/04/2018 FINDINGS: Normal heart size, mediastinal contours, and pulmonary vascularity. Atherosclerotic calcification aorta. 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 Bronchitic changes with bibasilar atelectasis versus scarring. No acute infiltrate, pleural effusion, or pneumothorax. Prior cervical spine fusion. IMPRESSION: Bronchitic changes with bibasilar atelectasis versus scarring. Electronically Signed   By: Lavonia Dana M.D.   On: 04/08/2018 15:17   Urinalysis    Component Value Date/Time   COLORURINE YELLOW 04/04/2018 0305   APPEARANCEUR HAZY (A) 04/04/2018 0305   LABSPEC 1.008 04/04/2018 0305   PHURINE 6.0 04/04/2018 0305   GLUCOSEU NEGATIVE 04/04/2018 0305   HGBUR SMALL (A) 04/04/2018 0305   BILIRUBINUR NEGATIVE 04/04/2018 0305   BILIRUBINUR neg 07/18/2013 1427   KETONESUR NEGATIVE 04/04/2018 0305   PROTEINUR NEGATIVE 04/04/2018 0305   UROBILINOGEN 0.2 04/03/2018 1131   NITRITE POSITIVE (A) 04/04/2018 0305   LEUKOCYTESUR LARGE (A) 04/04/2018 0305    Results/Tests Pending at Time of Discharge: c. diff  Discharge Medications:  Allergies as of 04/08/2018      Reactions   Iodinated Diagnostic Agents Shortness Of Breath, Itching   States she had this reaction connected with a MRI of the cervical spine.   Shellfish Allergy Anaphylaxis, Other (See Comments)   All seafood   Statins Other (See Comments)   Arthralgias (severe) with atorvastatin, mild arthralgias with rosuvastatin but willing to continue taking it      Medication List    STOP taking these medications   furosemide 40 MG tablet Commonly  known as:  LASIX   metoCLOPramide 5 MG tablet Commonly known as:  REGLAN   oseltamivir 75 MG capsule Commonly known as:  TAMIFLU   rosuvastatin 10 MG tablet Commonly known as:  CRESTOR     TAKE these medications   atorvastatin 40 MG tablet Commonly known as:  LIPITOR Take 40 mg by mouth daily at 6 PM.   clopidogrel 75 MG tablet Commonly known as:  PLAVIX TAKE 1 TABLET BY MOUTH DAILY   doxepin 100 MG capsule Commonly known as:  SINEQUAN Take 2 capsules (200 mg total) by mouth at bedtime.   esomeprazole 20 MG  capsule Commonly known as:  NEXIUM Take 1 capsule (20 mg total) by mouth daily.   ezetimibe 10 MG tablet Commonly known as:  ZETIA Take 10 mg by mouth daily.   Fremanezumab-vfrm 225 MG/1.5ML Sosy Commonly known as:  AJOVY Inject 225 mg into the skin every 30 (thirty) days.   gabapentin 600 MG tablet Commonly known as:  NEURONTIN TAKE 1 TABLET BY MOUTH TWICE DAILY   lidocaine 5 % Commonly known as:  LIDODERM Place 2 patches onto the skin daily. Apply to 2 patches to affected area in the AM, then remove after 12 hours   meclizine 25 MG tablet Commonly known as:  ANTIVERT Take 1 tablet (25 mg total) by mouth 2 (two) times daily as needed for dizziness.   propranolol 10 MG tablet Commonly known as:  INDERAL Take 10 mg by mouth 2 (two) times daily.   ramipril 5 MG capsule Commonly known as:  ALTACE Take 1 capsule (5 mg total) by mouth daily.   saccharomyces boulardii 250 MG capsule Commonly known as:  FLORASTOR Take 1 capsule (250 mg total) by mouth 2 (two) times daily.   sulfamethoxazole-trimethoprim 800-160 MG tablet Commonly known as:  BACTRIM DS,SEPTRA DS Take 1 tablet by mouth every 12 (twelve) hours for 19 doses.   tiZANidine 2 MG tablet Commonly known as:  ZANAFLEX Take 1 tablet (2 mg total) by mouth 3 (three) times daily.   topiramate 100 MG tablet Commonly known as:  TOPAMAX Take 100 mg by mouth 2 (two) times daily.   zonisamide 100 MG capsule Commonly known as:  ZONEGRAN Take 1 capsule (100 mg total) by mouth 2 (two) times daily.       Discharge Instructions: Please refer to Patient Instructions section of EMR for full details.  Patient was counseled important signs and symptoms that should prompt return to medical care, changes in medications, dietary instructions, activity restrictions, and follow up appointments.   Follow-Up Appointments: Follow-up Information    Lovenia Kim, MD. Go on 04/16/2018.   Specialty:  Family Medicine Why:  '@9'$ :10AM (please  arrive 15mn early) Contact information: [phone]        RSkeet Latch MD .   Specialty:  Cardiology Contact information: [phone]           MDanna Hefty DO 04/08/2018, 7:07 PM PGY-1, [facility]  Discharge Note: Attending BChrisandra NettersMD  I have seen and examined the patient, reviewed this patient and the patient's chart and have discussed discharge planning with the resident at the time of discharge. I agree with the discharge plan as above.

## 2018-04-04 NOTE — ED Notes (Signed)
C/o a migraine headache

## 2018-04-04 NOTE — H&P (Addendum)
[facility] Admission History and Physical Service Pager: [phone]  Patient name: [name] Medical record number: [id number] Date of birth: 07/31/1950 Age: 68 y.o. Gender: female  Primary Care Provider: Lovenia Kim, MD Consultants: none Code Status: partial  Chief Complaint: fevers, chills, nausea, vomiting  Assessment and Plan: [name] is a 68 y.o. female presenting with sepsis 2/2 to urinary tract infection.  Sepsis/lactic acidosis 3 out of 4 Sirs criteria with temperature, blood pressure, heart rate.  Presumed source is UTI based on UA containing nitrites and greater than 50 white blood cells on microscopy.  Patient responded appropriately to 2 L fluid in ED.  Lactic acid from 2.1-> 0.9.  Heart rate and blood pressure have improved somewhat.  Her on Rocephin, will plan to continue.  Can likely switch to oral when sensitivities result.  Confounding factor is symptoms of viral upper respiratory infection.  While these are likely causing the symptoms it is unlikely these are causing her sepsis symptoms.  Given that she is flu negative we will stop Tamiflu. -Admit to [facility], Dr. Nori Riis, MedSurg -Tylenol 325 as needed every 6 hours for fever and pain -Clear liquid diet; advance as tolerated -Lovenox for DVT prophylaxis -Rocephin 1 g daily for UTI -Follow-up urine culture, blood culture -Monitor heart rate and blood pressure  Migraine States that she is having symptoms consistent with migraine.  Takes Topamax and propranolol as outpatient.  Holding propranolol given her blood pressures.  Received 1 dose of Toradol in ED.  Will add on dose of Benadryl and Zofran to complete migraine cocktail.  Contraindication to Imitrex given her history of TIA.  Has Zonergan on medication list.  Unclear benefit of both Zonegran and Topamax.  Will hold zonergan. -Received Toradol x1, Benadryl x1, Zofran x1 -Topamax 100 mg twice daily - hold zonergan  History of  pulmonary embolism Diagnosed in 08/2017.  Complete anticoagulation and Eliquis DC'd in 10/2017.  While patient does have well score of 3 based on tachycardia and history of PE, given her symptomatology this felt to be a very unlikely.  History of TIA Occurred in 05/2014.  Patient is been taking Plavix daily since that time.  Unclear benefit of Plavix at this time.  Can follow-up with PCP as outpatient and discuss necessity of Plavix. -Continue Plavix while inpatient -Follow-up with PCP to discuss necessity  Hyperlipidemia Lipid panel from 12/09/2017 reviewed.  Well-controlled with appropriate levels of LDL, HDL, total cholesterol.  Taking Zetia 10 mg daily.  Cannot take statin due to myalgias.  Continue Zetia.  Follow-up with PCP for further lipid management.  Hypertension Patient presented mildly hypotensive to ED.  Patient ramipril 5 mg daily, furosemide 40 mg and propanolol 10 mg twice daily.  Will hold both these medications given hypotension. -Hold furosemide 40 mg -Hold propanolol 10 mg - hold rampril 5mg   CKD stage IIIb Creatinine 1.45, GFR 37.  Baseline creatinine 1.4.  Avoid nephrotoxic medications.  Will give maintenance fluids while having nausea vomiting.  PMH is significant for anxiety, migraines, hypertension, hyperlipidemia, transient ischemic attack, plantar fascitis, pulmonary embolism  FEN/GI: Clear liquid diet, normal saline at 100 mL/h Prophylaxis: Plavix 75 mg  Disposition: Likely home  History of Present Illness:  [name] is a 68 y.o. female presenting with 1-day history of diffuse body aches, headaches, chills, fevers.  Usually seen in urgent care on 1/25 and diagnosed with flulike symptoms.  She started on Tamiflu 75 mg at that time.  Patient did  not any better and came to emergency for further care.  Patient initially set that sepsis criteria in the ED via tachycardia and hypotension.  Work-up in the ED consisted of a CMP, lactic acid, CBC, flu PCR, UA, 2  view chest x-ray, blood cultures.  CMP only significant for creatinine 1.45, glucose 138, AST 42.  Lactic acid initially 2.1 but decreased to 0.9 after 2 L fluid resuscitation.  CBC unremarkable.  Flu PCR negative.  UA significant for large leukocytes, positive nitrites, greater than 50 WBCs per microscopy.  Chest x-ray without any abnormality aside from aortic atherosclerosis.  Patient was given ceftriaxone, 2 L fluid bolus in ED.  She also received 1 dose of Toradol and Zofran.  Review Of Systems: Per HPI with the following additions:   Review of Systems  Constitutional: Positive for chills, fever and malaise/fatigue.  Eyes: Negative for pain.  Respiratory: Positive for cough and sputum production. Negative for hemoptysis and shortness of breath.   Cardiovascular: Negative for chest pain, palpitations and leg swelling.  Gastrointestinal: Positive for nausea and vomiting. Negative for abdominal pain, constipation and diarrhea.  Genitourinary: Negative for dysuria and urgency.    Patient Active Problem List   Diagnosis Date Noted  . Urinary tract infection 04/04/2018  . Plantar fasciitis 01/10/2018  . Chronic anxiety 01/08/2018  . Gastroesophageal reflux disease 01/08/2018  . Menopausal syndrome 01/08/2018  . Arthralgia of right temporomandibular joint 09/28/2017  . Leg swelling 08/24/2017  . Right leg pain   . Pulmonary embolism ([place])   . Coronary artery calcification   . Dyspnea 07/26/2017  . Osteoarthritis of knee 04/08/2017  . Right ear pain 01/02/2017  . Hospital discharge follow-up 01/25/2016  . Difficulty in walking, not elsewhere classified   . Diarrhea of presumed infectious origin   . Hypokalemia   . Bilateral pleural effusion   . Status post cholecystectomy   . Infection by Streptococcus, viridans group   . Sepsis ([place]) 12/11/2015  . Hyperlipidemia LDL goal <70 04/02/2015  . Chronic migraine without aura, with status migrainosus 09/26/2014  . Dizziness 11/09/2013   . Adjustment disorder with mixed anxiety and depressed mood 11/03/2013  . Migraine variant 10/10/2013  . Disturbance of skin sensation 10/10/2013  . TIA (transient ischemic attack) 10/10/2013  . HTN (hypertension) 07/18/2013  . 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 Neuralgia, neuritis, and radiculitis, unspecified 02/04/2012  . 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 Myofascial muscle pain 07/15/2011    Past Medical History: Past Medical History:  Diagnosis Date  . Chronic kidney disease    stage 3  . GERD (gastroesophageal reflux disease)   . Hyperlipidemia   . Migraine headache   . Occipital neuralgia    Left  . Renal insufficiency   . Stroke [facility]) 10/2013    Past Surgical History: Past Surgical History:  Procedure Laterality Date  . ABDOMINAL HYSTERECTOMY    . BILIARY STENT PLACEMENT N/A 12/12/2015   Procedure: BILIARY STENT PLACEMENT;  Surgeon: Doran Stabler, MD;  Location: [facility] ENDOSCOPY;  Service: Endoscopy;  Laterality: N/A;  . BLADDER REPAIR    . CARPAL TUNNEL RELEASE    . CERVICAL SPINE SURGERY    . CHOLECYSTECTOMY N/A 12/06/2015   Procedure: LAPAROSCOPIC CHOLECYSTECTOMY WITH  INTRAOPERATIVE CHOLANGIOGRAM;  Surgeon: Stark Klein, MD;  Location: [facility];  Service: General;  Laterality: N/A;  . ELBOW SURGERY    . ERCP N/A 12/12/2015   Procedure: ENDOSCOPIC RETROGRADE CHOLANGIOPANCREATOGRAPHY (ERCP);  Surgeon: Doran Stabler, MD;  Location: [facility]  ENDOSCOPY;  Service: Endoscopy;  Laterality: N/A;  . ESOPHAGOGASTRODUODENOSCOPY N/A 01/28/2016   Procedure: ESOPHAGOGASTRODUODENOSCOPY (EGD) Biliary STENT removal;  Surgeon: Doran Stabler, MD;  Location: [facility] ENDOSCOPY;  Service: Gastroenterology;  Laterality: N/A;  . IR GENERIC HISTORICAL  12/17/2015   IR US GUIDE BX ASP/DRAIN 12/17/2015 [facility]INTER[facility]  . IR GENERIC HISTORICAL  12/17/2015   IR GUIDED DRAIN W CATHETER PLACEMENT 12/17/2015 [facility]  . IR GENERIC HISTORICAL  12/17/2015   IR SINUS/FIST TUBE CHK-NON GI  12/17/2015 [facility]  . KNEE SURGERY    . LUNG SURGERY    . TEE WITHOUT CARDIOVERSION N/A 12/19/2015   Procedure: TRANSESOPHAGEAL ECHOCARDIOGRAM (TEE);  Surgeon: Josue Hector, MD;  Location: [facility] ENDOSCOPY;  Service: Cardiovascular;  Laterality: N/A;    Social History: Social History   Tobacco Use  . Smoking status: Former Smoker    Packs/day: 0.30    Types: Cigarettes    Last attempt to quit: 11/01/2011    Years since quitting: 6.4  . Smokeless tobacco: Never Used  Substance Use Topics  . Alcohol use: No    Alcohol/week: 0.0 standard drinks  . Drug use: No   Additional social history: Please also refer to relevant sections of EMR.  Family History: Family History  Problem Relation Age of Onset  . Cancer Mother 42       breast  . Alzheimer's disease Mother   . Cancer Brother   . Hyperlipidemia Brother   . Prostate cancer Father   . Lung cancer Father   . Cancer Brother   . Hyperlipidemia Brother   . Dementia Brother        frontotemporal dementia    Allergies and Medications: Allergies  Allergen Reactions  . Iodinated Diagnostic Agents Shortness Of Breath and Itching    States she had this reaction connected with a MRI of the cervical spine.  . Shellfish Allergy Anaphylaxis and Other (See Comments)    All seafood  . Statins Other (See Comments)    Arthralgias (severe) with atorvastatin, mild arthralgias with rosuvastatin but willing to continue taking it   No current facility-administered medications on file prior to encounter.    Current Outpatient Medications on File Prior to Encounter  Medication Sig Dispense Refill  . atorvastatin (LIPITOR) 40 MG tablet Take 40 mg by mouth daily at 6 PM.     . clopidogrel (PLAVIX) 75 MG tablet TAKE 1 TABLET BY MOUTH DAILY (Patient taking differently: Take 75 mg by mouth daily. ) 30 tablet 5  . doxepin (SINEQUAN) 100 MG capsule Take 2 capsules (200 mg total) by mouth at bedtime. 180 capsule 1  . esomeprazole (NEXIUM) 20  MG capsule Take 1 capsule (20 mg total) by mouth daily. 30 capsule 5  . ezetimibe (ZETIA) 10 MG tablet Take 10 mg by mouth daily.     . furosemide (LASIX) 40 MG tablet Take 40 mg by mouth daily.    Marland Kitchen gabapentin (NEURONTIN) 600 MG tablet TAKE 1 TABLET BY MOUTH TWICE DAILY (Patient taking differently: Take 600 mg by mouth 2 (two) times daily. ) 180 tablet 1  . lidocaine (LIDODERM) 5 % Place 2 patches onto the skin daily. Apply to 2 patches to affected area in the AM, then remove after 12 hours 60 patch 5  . metoCLOPramide (REGLAN) 5 MG tablet Take 5 mg by mouth 3 (three) times daily before meals.     Marland Kitchen oseltamivir (TAMIFLU) 75 MG capsule Take 1 capsule (75 mg total) by mouth  every 12 (twelve) hours. 10 capsule 0  . propranolol (INDERAL) 10 MG tablet Take 10 mg by mouth 2 (two) times daily.     . ramipril (ALTACE) 5 MG capsule Take 1 capsule (5 mg total) by mouth daily. 90 capsule 1  . tiZANidine (ZANAFLEX) 2 MG tablet Take 1 tablet (2 mg total) by mouth 3 (three) times daily. 270 tablet 3  . topiramate (TOPAMAX) 100 MG tablet Take 100 mg by mouth 2 (two) times daily.     Marland Kitchen zonisamide (ZONEGRAN) 100 MG capsule Take 1 capsule (100 mg total) by mouth 2 (two) times daily. 60 capsule 11  . Fremanezumab-vfrm (AJOVY) 225 MG/1.5ML SOSY Inject 225 mg into the skin every 30 (thirty) days. (Patient not taking: Reported on 04/04/2018) 1.5 mL 5  . meclizine (ANTIVERT) 25 MG tablet Take 1 tablet (25 mg total) by mouth 2 (two) times daily as needed for dizziness. (Patient not taking: Reported on 04/04/2018) 20 tablet 0  . rosuvastatin (CRESTOR) 10 MG tablet Take 5mg  every Monday, Wednesday and Friday (Patient not taking: Reported on 04/04/2018) 90 tablet 0    Objective: BP 95/62   Pulse 83   Temp 98.8 F (37.1 C)   Resp (!) 21   Ht 5\' 7"  (1.702 m)   Wt 71.2 kg   SpO2 95%   BMI 24.59 kg/m  Exam: General: Pleasant 68 year old Caucasian female, resting comfortably in hospital bed Eyes: EOMI, PERRLA ENTM:  Respiratory mask in place.  No cervical lymphadenopathy Cardiovascular: Tachycardia, no M/R/G.  Palpable peripheral pulses.  Less than 2-second cap refill Respiratory: Lungs clear to auscultation bilaterally, no acute stress, no accessory muscle use Gastrointestinal: Soft, nontender, nondistended MSK: 5/5 strength all muscle groups bilateral upper extremity, bilateral lower extremity Derm: Warm and dry Neuro: CN II through XII intact, no focal neurologic deficit Psych: Appropriate  Labs and Imaging: CBC BMET  Recent Labs  Lab 04/03/18 2319  WBC 9.1  HGB 12.9  HCT 41.4  PLT 140*   Recent Labs  Lab 04/03/18 2319  NA 137  K 3.5  CL 104  CO2 23  BUN 18  CREATININE 1.45*  GLUCOSE 138*  CALCIUM 8.6Guadalupe Dawn, MD 04/04/2018, 6:17 AM PGY-2, [place] [facility] pager: [phone], text pages welcome

## 2018-04-04 NOTE — Progress Notes (Signed)
Family Medicine Teaching Service Daily Progress Note Intern Pager: [phone]  Patient name: [name] Medical record number: [id number] Date of birth: August 20, 1950 Age: 68 y.o. Gender: female  Primary Care Provider: Lovenia Kim, MD Consultants: None Code Status: Partial  Pt Overview and Major Events to Date:  1/26: admitted to [facility], Right hip/pelvis x-ray negative, lumbar spine x-ray: chronic compression fracture of L1, CXR neg, CT abd/pelvis: neg, Rocephin (1/26-) for UTI, ID Blood Cx: (+) E.coli bacteremia 1/27: Urine cx (+) >100,000 E.coli  Assessment and Plan: [name] is a 68 y.o. female presenting with sepsis 2/2 to urinary tract infection. PMH is significant for anxiety, migraines, hypertension, hyperlipidemia, transient ischemic attack, plantar fascitis, pulmonary embolism  Sepsis 2/2 to UTI and E.Coli Bacteremia: UA with nitrites and >50WBC. ID blood culture (+) E.coli.  Urine culture with >100,000 colonies of E.coli. Lactic acidosis resolved s/p fluids. Last fever on 1/25 at 2305 resolved with tylenol, afebrile since. Started on Rocephin. Initial concern for sepsis from upper respiratory etiology, however CXR negative and flu negative. URI symptoms improved. Remained hemodynamically stable and afebrile, CBC/CMP WNL without leukocytosis. No recent hospital exposure or concern for pseudomonas at this time.  - Continue Rocephin 1g QD (1/26-), follow up urine/blood cultures and sensitivities - Tylenol 650 q6h PRN fever/pain - AM CBC - follow fever curve, consider further work up if becomes febrile - continue NS mIVF given bacteremia and continued nausea   H/o Migraine: Symptoms consistent of migraine on admission. Per chart review, patient's Topamax was discontinued and she was started on Zonegran in Nov 2019. Does not appear she was on Propranolol either. Home meds include: Tizanidine '2mg'$  TID, Zonegran '100mg'$  BID, Ajovy '225mg'$ , and Doxipin '200mg'$  qHS. No relief s/p migraine  cocktail (Toradol, Benadryl, and Zofran) in ED. Had migraines ON: received Toradol, Zofran, and morphine ONx4.  - Stop Topamax - Begin home Tizanidine, Zonegran, and Doxipin qHS - Hold Propranolol given soft BP's - Hold Ajovy, restart at discharge  Right lower back/pelvic pain:  Right hip x-ray with Mild degenerative changes in the hips, left>right. No acute fractures or dislocations. CT abd/pelvis with degenerative changes of the lumbar spine and chronic L1 compression, otherwise negative. S/p morphine and toradol ON which only provided mild relief. Still painful this morning. - Begin Tramadol '50mg'$  q12hr PRN for mod-severe pain first, then try Morphine '2mg'$  IV q2 hr PRN pain  - Lidocaine Patch PRN - k- pad  Hyperlipidemia: stable Home meds: Zetia '10mg'$  QD, h/o statin intolerance with myalgias.  - continue home meds  - Follow-up with PCP for further lipid management.  Hypertension: BP on admission: low 350'K systolically. BP ON: 110-135 SBP, BP this AM: 124/76. Home meds: ramipril 5 mg QD, furosemide 40 mg and propanolol 10 mg BID.   - Continue to hold home meds given soft BP's   CKD stage IIIb Creatinine 1.45>1.17, GFR 37 (BL Cr 1.4). Received mIVF ON for nausea/vomiting, only mildly improved this morning with persistent nausea but no vomiting. - Avoid nephrotoxic medications.   - continue mIVF given  - monitor BMP   History of pulmonary embolism Diagnosed in 08/2017. Eliquis DC'd in 10/2017.  - continue Plavix for DVT ppx  History of TIA: stable Occurred in 05/2014. Home meds: Plavix QD.  Recommend follow-up with PCP as outpatient to further discuss benefit/necessity of Plavix  -Continue Plavix while inpatient -Follow-up with PCP to discuss necessity  FEN/GI: Clear liquid diet, advance as tolerated PPx: Plavix and Lovenox  Disposition: pending nephro  recs and improvement with  cultures  Subjective:  Patient is complaining of a 10/10 headache. Notes her home medications  help keep it calm. She is also complaining of right pelvic pain that radiates from lower right back to right groin.   Objective: Temp:  [97.9 F (36.6 C)-101.5 F (38.6 C)] 98.8 F (37.1 C) (01/26 2233) Pulse Rate:  [72-104] 85 (01/26 2233) Resp:  [17-24] 18 (01/26 2233) BP: (95-135)/(62-70) 111/64 (01/26 2233) SpO2:  [94 %-100 %] 95 % (01/26 2233) Weight:  [78.1 kg] 78.1 kg (01/26 0641) Physical Exam: General: well nourished, well developed, in no acute distress with non-toxic appearance, lying in bed, appears comfortable although complaining of migraine HEENT: normocephalic, atraumatic, moist mucous membranes Neck: supple, normal ROM CV: regular rate and rhythm without murmurs, rubs, or gallops, 2+ radial pulses bilatreally Lungs: crackles along lower bases L>R otherwise clear to auscultation bilaterally with normal work of breathing Abdomen: soft, non-tender, non-distended,  normoactive bowel sounds, no suprapubic pain to palpation Skin: warm, dry, no rashes or lesions Extremities: warm and well perfused, normal tone MSK: Greater trochanter nontender to palpation.  Neuro: Alert and oriented, speech normal  Laboratory: Recent Labs  Lab 04/03/18 2319  WBC 9.1  HGB 12.9  HCT 41.4  PLT 140*   Recent Labs  Lab 04/03/18 2319  NA 137  K 3.5  CL 104  CO2 23  BUN 18  CREATININE 1.45*  CALCIUM 8.6*  PROT 6.4*  BILITOT 1.0  ALKPHOS 90  ALT 35  AST 42*  GLUCOSE 138*   Urinalysis    Component Value Date/Time   COLORURINE YELLOW 04/04/2018 0305   APPEARANCEUR HAZY (A) 04/04/2018 0305   LABSPEC 1.008 04/04/2018 0305   PHURINE 6.0 04/04/2018 0305   GLUCOSEU NEGATIVE 04/04/2018 0305   HGBUR SMALL (A) 04/04/2018 0305   BILIRUBINUR NEGATIVE 04/04/2018 0305   BILIRUBINUR neg 07/18/2013 1427   KETONESUR NEGATIVE 04/04/2018 0305   PROTEINUR NEGATIVE 04/04/2018 0305   UROBILINOGEN 0.2 04/03/2018 1131   NITRITE POSITIVE (A) 04/04/2018 0305   LEUKOCYTESUR LARGE (A)  04/04/2018 0305   ESR: 28 Lactic acid: 2.1>0.9 Flu A/B: neg Blood culture ID: E.coli  Lipase: 30  Imaging/Diagnostic Tests: Ct Abdomen Pelvis Wo Contrast  Result Date: 04/04/2018 CLINICAL DATA:  Right-sided flank pain EXAM: CT ABDOMEN AND PELVIS WITHOUT CONTRAST TECHNIQUE: Multidetector CT imaging of the abdomen and pelvis was performed following the standard protocol without IV contrast. COMPARISON:  12/26/2015 FINDINGS: Lower chest: Mild scarring is noted in the left base. No focal infiltrate or effusion is seen. Coronary calcifications are noted. Hepatobiliary: No focal liver abnormality is seen. Status post cholecystectomy. No biliary dilatation. Pancreas: Unremarkable. No pancreatic ductal dilatation or surrounding inflammatory changes. Spleen: Normal in size without focal abnormality. Adrenals/Urinary Tract: The adrenal glands are within normal limits bilaterally. Kidneys are well visualize without renal calculi or obstructive changes. The bladder is decompressed. Stomach/Bowel: Mild diverticular changes noted without evidence of diverticulitis. The appendix is well visualized and within normal limits. No small bowel abnormality is seen. Stomach is within normal limits. Vascular/Lymphatic: Aortic atherosclerosis. No enlarged abdominal or pelvic lymph nodes. Reproductive: Status post hysterectomy. No adnexal masses. Other: No abdominal wall hernia or abnormality. No abdominopelvic ascites. Musculoskeletal: Degenerative changes of the lumbar spine are noted. Chronic L1 compression deformity is noted. IMPRESSION: Chronic changes as described above. No acute abnormality noted. Electronically Signed   By: Inez Catalina M.D.   On: 04/04/2018 13:08   Dg Chest 2 View  Result Date:  04/04/2018 CLINICAL DATA:  Cough and fever EXAM: CHEST - 2 VIEW COMPARISON:  None. FINDINGS: The heart size and mediastinal contours are within normal limits. Aortic atherosclerosis at the arch with slight uncoiling of the  thoracic aorta. No aneurysm is identified. Both lungs are clear. ACDF of the lower cervical spine with bilateral multilevel degenerative facet arthropathy. The visualized skeletal structures are unremarkable. IMPRESSION: No active cardiopulmonary disease. Aortic atherosclerosis. Electronically Signed   By: Ashley Royalty M.D.   On: 04/04/2018 00:26   Dg Lumbar Spine 2-3 Views  Result Date: 04/04/2018 CLINICAL DATA:  Pain without trauma EXAM: LUMBAR SPINE - 2-3 VIEW COMPARISON:  CT scan of the abdomen and pelvis April 04, 2018 FINDINGS: Anterior wedging of L1 is stable. No acute fracture or traumatic malalignment. Multilevel degenerative disc disease and lower lumbar facet degenerative changes. Calcified atherosclerosis in the abdominal aorta. IMPRESSION: 1. Chronic compression fracture of L1. 2. No acute fracture or traumatic malalignment. 3. Degenerative changes. Electronically Signed   By: Dorise Bullion III M.D   On: 04/04/2018 18:26   Dg Hip Unilat With Pelvis 2-3 Views Right  Result Date: 04/04/2018 CLINICAL DATA:  General lumbar and right hip pain since yesterday. Fever. Patient denies injuries to right hip or lumbar spine. EXAM: DG HIP (WITH OR WITHOUT PELVIS) 2-3V RIGHT COMPARISON:  None. FINDINGS: Mild degenerative changes in the hips, left greater than right. No acute fractures or dislocations. IMPRESSION: Negative. Electronically Signed   By: Dorise Bullion III M.D   On: 04/04/2018 18:25    Danna Hefty, DO 04/04/2018, 11:44 PM PGY-1, [facility] Intern pager: [phone], text pages welcome

## 2018-04-04 NOTE — ED Notes (Signed)
Report given to rn on 6n      

## 2018-04-04 NOTE — Progress Notes (Signed)
PHARMACY - PHYSICIAN COMMUNICATION CRITICAL VALUE ALERT - BLOOD CULTURE IDENTIFICATION (BCID)  [name] is an 68 y.o. female who presented to [facility] on 04/03/2018   Assessment:  Patient admitted with 3/4 SIRS criteria  Name of physician (or Provider) Contacted: Family medicine  Current antibiotics: Ceftriaxone  Changes to prescribed antibiotics recommended:  Patient is on recommended antibiotics - No changes needed  Results for orders placed or performed during the hospital encounter of 04/03/18  Blood Culture ID Panel (Reflexed) (Collected: 04/03/2018 11:20 PM)  Result Value Ref Range   Enterococcus species NOT DETECTED NOT DETECTED   Listeria monocytogenes NOT DETECTED NOT DETECTED   Staphylococcus species NOT DETECTED NOT DETECTED   Staphylococcus aureus (BCID) NOT DETECTED NOT DETECTED   Streptococcus species NOT DETECTED NOT DETECTED   Streptococcus agalactiae NOT DETECTED NOT DETECTED   Streptococcus pneumoniae NOT DETECTED NOT DETECTED   Streptococcus pyogenes NOT DETECTED NOT DETECTED   Acinetobacter baumannii NOT DETECTED NOT DETECTED   Enterobacteriaceae species DETECTED (A) NOT DETECTED   Enterobacter cloacae complex NOT DETECTED NOT DETECTED   Escherichia coli DETECTED (A) NOT DETECTED   Klebsiella oxytoca NOT DETECTED NOT DETECTED   Klebsiella pneumoniae NOT DETECTED NOT DETECTED   Proteus species NOT DETECTED NOT DETECTED   Serratia marcescens NOT DETECTED NOT DETECTED   Carbapenem resistance NOT DETECTED NOT DETECTED   Haemophilus influenzae NOT DETECTED NOT DETECTED   Neisseria meningitidis NOT DETECTED NOT DETECTED   Pseudomonas aeruginosa NOT DETECTED NOT DETECTED   Candida albicans NOT DETECTED NOT DETECTED   Candida glabrata NOT DETECTED NOT DETECTED   Candida krusei NOT DETECTED NOT DETECTED   Candida parapsilosis NOT DETECTED NOT DETECTED   Candida tropicalis NOT DETECTED NOT DETECTED    Harvel Quale 04/04/2018  5:53 PM

## 2018-04-05 DIAGNOSIS — R69 Illness, unspecified: Secondary | ICD-10-CM | POA: Diagnosis not present

## 2018-04-05 DIAGNOSIS — N183 Chronic kidney disease, stage 3 (moderate): Secondary | ICD-10-CM | POA: Diagnosis present

## 2018-04-05 DIAGNOSIS — N12 Tubulo-interstitial nephritis, not specified as acute or chronic: Secondary | ICD-10-CM | POA: Diagnosis present

## 2018-04-05 DIAGNOSIS — R52 Pain, unspecified: Secondary | ICD-10-CM | POA: Diagnosis not present

## 2018-04-05 DIAGNOSIS — Z888 Allergy status to other drugs, medicaments and biological substances status: Secondary | ICD-10-CM | POA: Diagnosis not present

## 2018-04-05 DIAGNOSIS — Z91013 Allergy to seafood: Secondary | ICD-10-CM | POA: Diagnosis not present

## 2018-04-05 DIAGNOSIS — Z7902 Long term (current) use of antithrombotics/antiplatelets: Secondary | ICD-10-CM | POA: Diagnosis not present

## 2018-04-05 DIAGNOSIS — R197 Diarrhea, unspecified: Secondary | ICD-10-CM | POA: Diagnosis present

## 2018-04-05 DIAGNOSIS — R05 Cough: Secondary | ICD-10-CM | POA: Diagnosis not present

## 2018-04-05 DIAGNOSIS — N390 Urinary tract infection, site not specified: Secondary | ICD-10-CM | POA: Diagnosis not present

## 2018-04-05 DIAGNOSIS — Z8673 Personal history of transient ischemic attack (TIA), and cerebral infarction without residual deficits: Secondary | ICD-10-CM | POA: Diagnosis not present

## 2018-04-05 DIAGNOSIS — G43909 Migraine, unspecified, not intractable, without status migrainosus: Secondary | ICD-10-CM | POA: Diagnosis present

## 2018-04-05 DIAGNOSIS — Z87891 Personal history of nicotine dependence: Secondary | ICD-10-CM | POA: Diagnosis not present

## 2018-04-05 DIAGNOSIS — I129 Hypertensive chronic kidney disease with stage 1 through stage 4 chronic kidney disease, or unspecified chronic kidney disease: Secondary | ICD-10-CM | POA: Diagnosis present

## 2018-04-05 DIAGNOSIS — A4151 Sepsis due to Escherichia coli [E. coli]: Secondary | ICD-10-CM | POA: Diagnosis present

## 2018-04-05 DIAGNOSIS — Z91041 Radiographic dye allergy status: Secondary | ICD-10-CM | POA: Diagnosis not present

## 2018-04-05 DIAGNOSIS — Z86711 Personal history of pulmonary embolism: Secondary | ICD-10-CM | POA: Diagnosis not present

## 2018-04-05 DIAGNOSIS — E785 Hyperlipidemia, unspecified: Secondary | ICD-10-CM | POA: Diagnosis present

## 2018-04-05 DIAGNOSIS — E872 Acidosis: Secondary | ICD-10-CM | POA: Diagnosis present

## 2018-04-05 DIAGNOSIS — K219 Gastro-esophageal reflux disease without esophagitis: Secondary | ICD-10-CM | POA: Diagnosis present

## 2018-04-05 DIAGNOSIS — Z79899 Other long term (current) drug therapy: Secondary | ICD-10-CM | POA: Diagnosis not present

## 2018-04-05 LAB — CBC WITH DIFFERENTIAL/PLATELET
Abs Immature Granulocytes: 0.03 10*3/uL (ref 0.00–0.07)
Basophils Absolute: 0 10*3/uL (ref 0.0–0.1)
Basophils Relative: 1 %
Eosinophils Absolute: 0.1 10*3/uL (ref 0.0–0.5)
Eosinophils Relative: 1 %
HCT: 32.4 % — ABNORMAL LOW (ref 36.0–46.0)
Hemoglobin: 10.4 g/dL — ABNORMAL LOW (ref 12.0–15.0)
Immature Granulocytes: 1 %
Lymphocytes Relative: 14 %
Lymphs Abs: 0.9 10*3/uL (ref 0.7–4.0)
MCH: 28.4 pg (ref 26.0–34.0)
MCHC: 32.1 g/dL (ref 30.0–36.0)
MCV: 88.5 fL (ref 80.0–100.0)
Monocytes Absolute: 0.6 10*3/uL (ref 0.1–1.0)
Monocytes Relative: 9 %
Neutro Abs: 4.9 10*3/uL (ref 1.7–7.7)
Neutrophils Relative %: 74 %
Platelets: 126 10*3/uL — ABNORMAL LOW (ref 150–400)
RBC: 3.66 MIL/uL — ABNORMAL LOW (ref 3.87–5.11)
RDW: 14.4 % (ref 11.5–15.5)
WBC: 6.4 10*3/uL (ref 4.0–10.5)
nRBC: 0 % (ref 0.0–0.2)

## 2018-04-05 LAB — COMPREHENSIVE METABOLIC PANEL
ALT: 30 U/L (ref 0–44)
AST: 34 U/L (ref 15–41)
Albumin: 2.8 g/dL — ABNORMAL LOW (ref 3.5–5.0)
Alkaline Phosphatase: 66 U/L (ref 38–126)
Anion gap: 5 (ref 5–15)
BUN: 8 mg/dL (ref 8–23)
CO2: 23 mmol/L (ref 22–32)
Calcium: 7.7 mg/dL — ABNORMAL LOW (ref 8.9–10.3)
Chloride: 113 mmol/L — ABNORMAL HIGH (ref 98–111)
Creatinine, Ser: 1.17 mg/dL — ABNORMAL HIGH (ref 0.44–1.00)
GFR calc Af Amer: 56 mL/min — ABNORMAL LOW (ref >60)
GFR calc non Af Amer: 49 mL/min — ABNORMAL LOW (ref >60)
Glucose, Bld: 113 mg/dL — ABNORMAL HIGH (ref 70–99)
Potassium: 3.8 mmol/L (ref 3.5–5.1)
Sodium: 141 mmol/L (ref 135–145)
Total Bilirubin: 0.7 mg/dL (ref 0.3–1.2)
Total Protein: 5.1 g/dL — ABNORMAL LOW (ref 6.5–8.1)

## 2018-04-05 MED ORDER — ZONISAMIDE 100 MG PO CAPS
100.0000 mg | ORAL_CAPSULE | Freq: Two times a day (BID) | ORAL | Status: DC
  Administered 2018-04-05 – 2018-04-08 (×6): 100 mg via ORAL
  Filled 2018-04-05 (×7): qty 1

## 2018-04-05 MED ORDER — LIDOCAINE 5 % EX PTCH
1.0000 | MEDICATED_PATCH | CUTANEOUS | Status: DC
  Administered 2018-04-05 – 2018-04-08 (×4): 1 via TRANSDERMAL
  Filled 2018-04-05 (×4): qty 1

## 2018-04-05 MED ORDER — DOXEPIN HCL 100 MG PO CAPS
200.0000 mg | ORAL_CAPSULE | Freq: Every day | ORAL | Status: DC
  Administered 2018-04-05 – 2018-04-07 (×3): 200 mg via ORAL
  Filled 2018-04-05 (×4): qty 2

## 2018-04-05 MED ORDER — TRAMADOL HCL 50 MG PO TABS: 50.0000 mg | ORAL_TABLET | Freq: Two times a day (BID) | ORAL | Status: DC | PRN

## 2018-04-05 MED ORDER — TIZANIDINE HCL 2 MG PO TABS
2.0000 mg | ORAL_TABLET | Freq: Three times a day (TID) | ORAL | Status: DC
  Administered 2018-04-05 – 2018-04-08 (×10): 2 mg via ORAL
  Filled 2018-04-05 (×10): qty 1

## 2018-04-05 MED ORDER — TRAMADOL HCL 50 MG PO TABS
50.0000 mg | ORAL_TABLET | Freq: Two times a day (BID) | ORAL | Status: DC
  Administered 2018-04-05 – 2018-04-07 (×5): 50 mg via ORAL
  Filled 2018-04-05 (×5): qty 1

## 2018-04-05 MED ORDER — ENOXAPARIN SODIUM 40 MG/0.4ML ~~LOC~~ SOLN
40.0000 mg | SUBCUTANEOUS | Status: DC
  Administered 2018-04-05 – 2018-04-08 (×4): 40 mg via SUBCUTANEOUS
  Filled 2018-04-05 (×4): qty 0.4

## 2018-04-05 NOTE — Plan of Care (Signed)
  Problem: Education: Goal: Knowledge of General Education information will improve Description: Including pain rating scale, medication(s)/side effects and non-pharmacologic comfort measures Outcome: Progressing   Problem: Nutrition: Goal: Adequate nutrition will be maintained Outcome: Progressing   Problem: Elimination: Goal: Will not experience complications related to urinary retention Outcome: Progressing   

## 2018-04-06 LAB — URINE CULTURE: Culture: >=100000 — AB

## 2018-04-06 LAB — BASIC METABOLIC PANEL
Anion gap: 9 (ref 5–15)
BUN: 6 mg/dL — ABNORMAL LOW (ref 8–23)
CO2: 21 mmol/L — ABNORMAL LOW (ref 22–32)
Calcium: 7.9 mg/dL — ABNORMAL LOW (ref 8.9–10.3)
Chloride: 110 mmol/L (ref 98–111)
Creatinine, Ser: 1.02 mg/dL — ABNORMAL HIGH (ref 0.44–1.00)
GFR calc Af Amer: >60 mL/min (ref >60)
GFR calc non Af Amer: 57 mL/min — ABNORMAL LOW (ref >60)
Glucose, Bld: 99 mg/dL (ref 70–99)
Potassium: 3.7 mmol/L (ref 3.5–5.1)
Sodium: 140 mmol/L (ref 135–145)

## 2018-04-06 LAB — CBC
HCT: 33.9 % — ABNORMAL LOW (ref 36.0–46.0)
Hemoglobin: 10.8 g/dL — ABNORMAL LOW (ref 12.0–15.0)
MCH: 27.9 pg (ref 26.0–34.0)
MCHC: 31.9 g/dL (ref 30.0–36.0)
MCV: 87.6 fL (ref 80.0–100.0)
Platelets: 134 10*3/uL — ABNORMAL LOW (ref 150–400)
RBC: 3.87 MIL/uL (ref 3.87–5.11)
RDW: 14.1 % (ref 11.5–15.5)
WBC: 4.8 10*3/uL (ref 4.0–10.5)
nRBC: 0 % (ref 0.0–0.2)

## 2018-04-06 LAB — CULTURE, BLOOD (ROUTINE X 2): Special Requests: ADEQUATE

## 2018-04-06 MED ORDER — SULFAMETHOXAZOLE-TRIMETHOPRIM 800-160 MG PO TABS
1.0000 | ORAL_TABLET | Freq: Two times a day (BID) | ORAL | Status: DC
  Administered 2018-04-06 – 2018-04-08 (×5): 1 via ORAL
  Filled 2018-04-06 (×5): qty 1

## 2018-04-06 MED ORDER — RAMIPRIL 5 MG PO CAPS
5.0000 mg | ORAL_CAPSULE | Freq: Every day | ORAL | Status: DC
  Administered 2018-04-06 – 2018-04-08 (×3): 5 mg via ORAL
  Filled 2018-04-06 (×3): qty 1

## 2018-04-06 NOTE — Discharge Instructions (Signed)
You have been diagnosed with a kidney infection, called pyelonephritis. This infection can be serious because it can damage your kidneys and cause bacteria to enter your bloodstream. You were hospitalized because your infection was severe. Here's what you can do at home to help recover and prevent future infections.   Home Care:     Take all the medication you were prescribed even if you feel better. If you don't finish the medication, the infection may return.  Not finishing the medication can also make any future infections harder to treat.     Drink 8 to 12 glasses of fluid every day, unless directed otherwise.     See your doctor for regular laboratory tests as directed.     Keep your genital area clean but avoid using strong soap. Rinse with water.     If you are a woman, always wipe the genital area from front to back.     Urinate frequently. Avoid holding urine in the bladder for a long time.     Always urinate after sexual intercourse.   Call your doctor right away if you have any of the following:     Decreased urine output or trouble urinating     Severe pain in the lower back or flank     Fever above 101.5 F or shaking chills     Vomiting     Blood in your urine     Dark-colored or foul-smelling urine     Nausea or other problems that prevent you from taking your prescribed medication

## 2018-04-06 NOTE — Plan of Care (Signed)
  Problem: Clinical Measurements: Goal: Will remain free from infection Outcome: Progressing   Problem: Activity: Goal: Risk for activity intolerance will decrease Outcome: Progressing   Problem: Nutrition: Goal: Adequate nutrition will be maintained Outcome: Progressing   Problem: Coping: Goal: Level of anxiety will decrease Outcome: Progressing   Problem: Pain Managment: Goal: General experience of comfort will improve Outcome: Progressing   Problem: Safety: Goal: Ability to remain free from injury will improve Outcome: Progressing   Problem: Skin Integrity: Goal: Risk for impaired skin integrity will decrease Outcome: Progressing   

## 2018-04-06 NOTE — Progress Notes (Signed)
Family Medicine Teaching Service Daily Progress Note Intern Pager: [phone]  Patient name: [name]      Medical record number: [id number] Date of birth: 28-Jun-1950        Age: 68 y.o.    Gender: female  Primary Care Provider: Lovenia Kim, MD Consultants: None Code Status: Partial  Pt Overview and Major Events to Date:  1/26: admitted to [facility], Right hip/pelvis x-ray negative, lumbar spine x-ray: chronic compression fracture of L1, CXR neg, CT abd/pelvis: neg, Rocephin (1/26-1/28) for UTI, ID Blood Cx: (+) E.coli bacteremia 1/27: Urine cx (+) >100,000 E.coli 1/28: Transitioned to Bactrim (1/28-2/8)  Assessment and Plan: [name] a 68 y.o.femalepresenting with sepsis 2/2to urinary tract infection. PMH is significant foranxiety, migraines, hypertension, hyperlipidemia, transient ischemic attack, plantar fascitis, pulmonary embolism  Sepsis 2/2 to (+) E.coli Pyelonephritis and E.Coli Bacteremia: Improving Remained afebrile and hemodynamically stable overnight (last fever 1/25). CBC still without leukocytosis, CMP WNL. Urine culture neg x 1 day. Cultures sensitive to Bactrim, Cephalosporins, Fluoroquinolones - Transition from Rocephin (1/26-1/28) to PO Bactrim 800/160 q12 x 14 days total (1/28-2/8) - Tylenol 650 q6h PRN fever/pain - AM CBC - follow fever curve, consider further work up if becomes febrile - discontinue mIVF  H/o Migraine: Transitioned back to home med regimen except for Ajovy and Propranolol. Patient notes no longer taking Ajovy and she is taking propranolol. Still currently with migraine but is aware it may take time given missed doses. - Continue home Tizanidine, Zonegran, and Doxipin qHS - Restart Propranolol at discharge - Remove Ajovy from meds at discharge  Right lower back/pelvic pain:  Likely associated pyelonephritis given location and timing of onset. Imaging of hip negative other than mild degenerative changes. Patient notes  improvement with scheduled Tramadol and lidocaine patch. Some tenderness to palpation on the right.  - Continue Tramadol 91m q12hr - Lidocaine Patch PRN - k- pad  Hyperlipidemia: stable Home meds: Zetia 122mQD, h/o statin intolerance with myalgias. - continue home meds  - Follow-up with PCP for further lipid management.  Hypertension: BP ON: 110-155 SBP, BP this AM: 139/78. Home meds: ramipril 5 mg QD,furosemide 40 mg and propranolol 10 mg BID.   - Given improvement in blood pressure, will restart Ramipril 5m91mD - Continue to hold Furosemide and propranolol  CKD stage IIIb: Stable Creatinine 1.45>1.17>1.02, GFR 57 (BL Cr 1.4). - Avoid nephrotoxic medications.  - discontinue mIVF - monitor BMP   History of pulmonary embolism Diagnosed in 08/2017. Eliquis DC'd in 10/2017.  - continue Plavix for DVT ppx  History of TIA: stable Occurred in 05/2014. Home meds: Plavix QD. Recommend follow-up with PCP as outpatient to further discuss benefit/necessity of Plavix  -Continue Plavix while inpatient -Follow-up with PCP to discuss necessity  FEN/GI: Clear liquid diet, advance as tolerated PPx: Plavix and Lovenox  Disposition: Pending sensitivities and transition to oral  Subjective:  Patient notes much improvement in her back pain with scheduled tramadol and lidocaine patch. Notes her headache still remains but she knows that it may take time for her home medications to take effect. Additionally, her nausea and vomiting have improved and will attempt to eat this morning.  Otherwise denies any other concerns or complains.   Objective: Temp:  [98.2 F (36.8 C)-98.7 F (37.1 C)] 98.6 F (37 C) (01/28 0511) Pulse Rate:  [71-85] 73 (01/28 0511) Resp:  [16-17] 16 (01/27 2214) BP: (106-155)/(65-97) 110/97 (01/28 1208) SpO2:  [95 %-97 %] 97 % (01/28 0511) Physical Exam: General:  well nourished, well developed, in no acute distress with non-toxic appearance, lying in bed,  appears much more comfortable HEENT: normocephalic, atraumatic, moist mucous membranes Neck: supple, normal ROM CV: regular rate and rhythm without murmurs, rubs, or gallops, no lower extremity edema Lungs: clear to auscultation bilaterally with normal work of breathing Abdomen: soft, non-tender, non-distended, normoactive bowel sounds Skin: warm, dry, no rashes or lesions Extremities: warm and well perfused, normal tone MSK: some tenderness to right low-mid back  Neuro: Alert and oriented, speech normal  Laboratory: Recent Labs  Lab 04/03/18 2319 04/05/18 0242 04/06/18 0404  WBC 9.1 6.4 4.8  HGB 12.9 10.4* 10.8*  HCT 41.4 32.4* 33.9*  PLT 140* 126* 134*   Recent Labs  Lab 04/03/18 2319 04/05/18 0242 04/06/18 0404  NA 137 141 140  K 3.5 3.8 3.7  CL 104 113* 110  CO2 23 23 21*  BUN 18 8 6*  CREATININE 1.45* 1.17* 1.02*  CALCIUM 8.6* 7.7* 7.9*  PROT 6.4* 5.1*  --   BILITOT 1.0 0.7  --   ALKPHOS 90 66  --   ALT 35 30  --   AST 42* 34  --   GLUCOSE 138* 113* 99    ESR: 28 Lactic acid: 2.1>0.9 Flu A/B: neg Blood culture ID: E.coli  Lipase: 30  Imaging/Diagnostic Tests: Ct Abdomen Pelvis Wo Contrast  Result Date: 04/04/2018 CLINICAL DATA:  Right-sided flank pain EXAM: CT ABDOMEN AND PELVIS WITHOUT CONTRAST TECHNIQUE: Multidetector CT imaging of the abdomen and pelvis was performed following the standard protocol without IV contrast. COMPARISON:  12/26/2015 FINDINGS: Lower chest: Mild scarring is noted in the left base. No focal infiltrate or effusion is seen. Coronary calcifications are noted. Hepatobiliary: No focal liver abnormality is seen. Status post cholecystectomy. No biliary dilatation. Pancreas: Unremarkable. No pancreatic ductal dilatation or surrounding inflammatory changes. Spleen: Normal in size without focal abnormality. Adrenals/Urinary Tract: The adrenal glands are within normal limits bilaterally. Kidneys are well visualize without renal calculi or  obstructive changes. The bladder is decompressed. Stomach/Bowel: Mild diverticular changes noted without evidence of diverticulitis. The appendix is well visualized and within normal limits. No small bowel abnormality is seen. Stomach is within normal limits. Vascular/Lymphatic: Aortic atherosclerosis. No enlarged abdominal or pelvic lymph nodes. Reproductive: Status post hysterectomy. No adnexal masses. Other: No abdominal wall hernia or abnormality. No abdominopelvic ascites. Musculoskeletal: Degenerative changes of the lumbar spine are noted. Chronic L1 compression deformity is noted. IMPRESSION: Chronic changes as described above. No acute abnormality noted. Electronically Signed   By: Inez Catalina M.D.   On: 04/04/2018 13:08   Dg Chest 2 View  Result Date: 04/04/2018 CLINICAL DATA:  Cough and fever EXAM: CHEST - 2 VIEW COMPARISON:  None. FINDINGS: The heart size and mediastinal contours are within normal limits. Aortic atherosclerosis at the arch with slight uncoiling of the thoracic aorta. No aneurysm is identified. Both lungs are clear. ACDF of the lower cervical spine with bilateral multilevel degenerative facet arthropathy. The visualized skeletal structures are unremarkable. IMPRESSION: No active cardiopulmonary disease. Aortic atherosclerosis. Electronically Signed   By: Ashley Royalty M.D.   On: 04/04/2018 00:26   Dg Lumbar Spine 2-3 Views  Result Date: 04/04/2018 CLINICAL DATA:  Pain without trauma EXAM: LUMBAR SPINE - 2-3 VIEW COMPARISON:  CT scan of the abdomen and pelvis April 04, 2018 FINDINGS: Anterior wedging of L1 is stable. No acute fracture or traumatic malalignment. Multilevel degenerative disc disease and lower lumbar facet degenerative changes. Calcified atherosclerosis in the abdominal  aorta. IMPRESSION: 1. Chronic compression fracture of L1. 2. No acute fracture or traumatic malalignment. 3. Degenerative changes. Electronically Signed   By: Dorise Bullion III M.D   On: 04/04/2018  18:26   Dg Hip Unilat With Pelvis 2-3 Views Right  Result Date: 04/04/2018 CLINICAL DATA:  General lumbar and right hip pain since yesterday. Fever. Patient denies injuries to right hip or lumbar spine. EXAM: DG HIP (WITH OR WITHOUT PELVIS) 2-3V RIGHT COMPARISON:  None. FINDINGS: Mild degenerative changes in the hips, left greater than right. No acute fractures or dislocations. IMPRESSION: Negative. Electronically Signed   By: Dorise Bullion III M.D   On: 04/04/2018 18:25    Danna Hefty, DO 04/06/2018, 12:39 PM PGY-1, [facility] Intern pager: [phone], text pages welcome

## 2018-04-07 LAB — CBC
HCT: 32.4 % — ABNORMAL LOW (ref 36.0–46.0)
Hemoglobin: 10.5 g/dL — ABNORMAL LOW (ref 12.0–15.0)
MCH: 28.1 pg (ref 26.0–34.0)
MCHC: 32.4 g/dL (ref 30.0–36.0)
MCV: 86.6 fL (ref 80.0–100.0)
Platelets: 155 10*3/uL (ref 150–400)
RBC: 3.74 MIL/uL — ABNORMAL LOW (ref 3.87–5.11)
RDW: 14.1 % (ref 11.5–15.5)
WBC: 4.9 10*3/uL (ref 4.0–10.5)
nRBC: 0 % (ref 0.0–0.2)

## 2018-04-07 LAB — C DIFFICILE QUICK SCREEN W PCR REFLEX
C Diff antigen: NEGATIVE
C Diff interpretation: NOT DETECTED
C Diff toxin: NEGATIVE

## 2018-04-07 LAB — BASIC METABOLIC PANEL
Anion gap: 7 (ref 5–15)
BUN: 9 mg/dL (ref 8–23)
CO2: 21 mmol/L — ABNORMAL LOW (ref 22–32)
Calcium: 8.3 mg/dL — ABNORMAL LOW (ref 8.9–10.3)
Chloride: 110 mmol/L (ref 98–111)
Creatinine, Ser: 1.22 mg/dL — ABNORMAL HIGH (ref 0.44–1.00)
GFR calc Af Amer: 53 mL/min — ABNORMAL LOW (ref >60)
GFR calc non Af Amer: 46 mL/min — ABNORMAL LOW (ref >60)
Glucose, Bld: 116 mg/dL — ABNORMAL HIGH (ref 70–99)
Potassium: 3.5 mmol/L (ref 3.5–5.1)
Sodium: 138 mmol/L (ref 135–145)

## 2018-04-07 MED ORDER — TRAMADOL HCL 50 MG PO TABS: 50.0000 mg | ORAL_TABLET | Freq: Two times a day (BID) | ORAL | Status: DC | PRN

## 2018-04-07 MED ORDER — LOPERAMIDE HCL 2 MG PO CAPS: 2.0000 mg | ORAL_CAPSULE | ORAL | Status: DC | PRN

## 2018-04-07 MED ORDER — LOPERAMIDE HCL 2 MG PO CAPS
4.0000 mg | ORAL_CAPSULE | Freq: Once | ORAL | Status: AC
  Administered 2018-04-07: 4 mg via ORAL
  Filled 2018-04-07: qty 2

## 2018-04-07 MED ORDER — IPRATROPIUM-ALBUTEROL 0.5-2.5 (3) MG/3ML IN SOLN
3.0000 mL | Freq: Once | RESPIRATORY_TRACT | Status: AC
  Administered 2018-04-07: 3 mL via RESPIRATORY_TRACT
  Filled 2018-04-07: qty 3

## 2018-04-07 MED ORDER — SACCHAROMYCES BOULARDII 250 MG PO CAPS
250.0000 mg | ORAL_CAPSULE | Freq: Two times a day (BID) | ORAL | Status: DC
  Administered 2018-04-07 – 2018-04-08 (×2): 250 mg via ORAL
  Filled 2018-04-07 (×2): qty 1

## 2018-04-07 NOTE — Plan of Care (Signed)
  Problem: Clinical Measurements: Goal: Will remain free from infection Outcome: Progressing   Problem: Activity: Goal: Risk for activity intolerance will decrease Outcome: Progressing   Problem: Nutrition: Goal: Adequate nutrition will be maintained Outcome: Progressing   Problem: Coping: Goal: Level of anxiety will decrease Outcome: Progressing   Problem: Pain Managment: Goal: General experience of comfort will improve Outcome: Progressing   Problem: Safety: Goal: Ability to remain free from injury will improve Outcome: Progressing   Problem: Skin Integrity: Goal: Risk for impaired skin integrity will decrease Outcome: Progressing   

## 2018-04-07 NOTE — Plan of Care (Signed)

## 2018-04-07 NOTE — Progress Notes (Signed)
Family Medicine Teaching Service Daily Progress Note Intern Pager: [phone]  Patient name: [name]      Medical record number: [id number] Date of birth: February 05, 1951        Age: 68 y.o.    Gender: female  Primary Care Provider: Lovenia Kim, MD Consultants: None Code Status: Partial  Pt Overview and Major Events to Date:  1/26: admitted to [facility], Right hip/pelvis x-ray negative, lumbar spine x-ray: chronic compression fracture of L1, CXR neg, CT abd/pelvis: neg, Rocephin (1/26-1/28) for UTI, ID Blood Cx: (+) E.coli bacteremia 1/27: Urine cx (+) >100,000 E.coli 1/28: Transitioned to Bactrim (1/28-2/8)  Assessment and Plan: Courtney Hataway Buntonis a 68 y.o.femalepresenting with sepsis 2/2to urinary tract infection. PMH is significant foranxiety, migraines, hypertension, hyperlipidemia, transient ischemic attack, plantar fascitis, pulmonary embolism  Diarrhea: Soft stools for several days. Patient has recent history of C.diff.  - obtain C. Diff lab - Begin probiotic and Imodium  Sepsis 2/2 to (+) E.coli Pyelonephritis and E.Coli Bacteremia: Improving Remained afebrile and hemodynamically stable overnight (last fever 1/25). CBC still without leukocytosis, CMP WNL.  - Continue PO Bactrim 800/160 q12 x 14 days total (1/28-2/8) - Tylenol 650 q6h PRN fever/pain - AM CBC - follow fever curve, consider further work up if becomes febrile - discontinue mIVF  H/o Migraine: improving Still present, but improved from day prior. - Continue home Tizanidine, Zonegran, and Doxipin qHS - Will defer restarting Propranolol to PCP - Remove Ajovy from meds at discharge  Right lower back/pelvic pain:  Improved and stable on scheduled Tramadol and lidocaine patch.  - Continue Tramadol 16m q12hr PRN - Lidocaine Patch PRN - k- pad  Hyperlipidemia: stable Home meds: Zetia 142mQD, h/o statin intolerance with myalgias. - continue home meds  - Follow-up with PCP for further lipid  management.  Hypertension: BP ON: 110-155 SBP, BP this AM: 105/65. Home meds: ramipril 5 mg QD,furosemide 40 mg and propranolol 10 mg BID.   - Continue Ramipril 33m70mD - Continue to hold Furosemide and propranolol  CKD stage IIIb: stable Creatinine 1.45>1.17>1.02>1.22, GFR 57 (BL Cr 1.4).Likely from Bactrim.  - Avoid nephrotoxic medications.  - monitor BMP   History of pulmonary embolism Diagnosed in 08/2017. Eliquis DC'd in 10/2017.  - continue Plavix for DVT ppx  History of TIA: stable Occurred in 05/2014. Home meds: Plavix QD. Recommend follow-up with PCP as outpatient to further discuss benefit/necessity of Plavix  -Continue Plavix while inpatient -Follow-up with PCP to discuss necessity  FEN/GI: Clear liquid diet, advance as tolerated PPx: Plavix and Lovenox  Disposition: Pending sensitivities and transition to oral  Subjective:  Notes some soft orange diarrhea for several days now. Concerned for c. Diff due to recent history of c. Diff after her last hospitalization. Still has headache but it has improved. Tolerating PO well this morning. Denies any nausea. Does admit to some wheezing with ambulation.   Objective: Temp:  [98 F (36.7 C)-100.3 F (37.9 C)] 98.3 F (36.8 C) (01/29 1442) Pulse Rate:  [68-82] 68 (01/29 1442) Resp:  [15-20] 15 (01/29 1442) BP: (105-149)/(65-82) 117/69 (01/29 1442) SpO2:  [96 %-99 %] 98 % (01/29 1442) Physical Exam: General: well nourished, well developed, in no acute distress with non-toxic appearance, comfortably lying in bed eating breakfast HEENT: normocephalic, atraumatic, moist mucous membranes Neck: supple, normal ROM CV: regular rate and rhythm without murmurs, rubs, or gallops, no lower extremity edema Lungs: fine crackles at lower bases bilaterally, otherwise clear to auscultation without any wheezes apprecaited, normal  work of breathing Abdomen: soft, non-tender, non-distended, normoactive bowel sounds Skin: warm, dry,  no rashes or lesions Extremities: warm and well perfused, normal tone Neuro: Alert and oriented, speech normal  Laboratory: Recent Labs  Lab 04/05/18 0242 04/06/18 0404 04/07/18 0136  WBC 6.4 4.8 4.9  HGB 10.4* 10.8* 10.5*  HCT 32.4* 33.9* 32.4*  PLT 126* 134* 155   Recent Labs  Lab 04/03/18 2319 04/05/18 0242 04/06/18 0404 04/07/18 0136  NA 137 141 140 138  K 3.5 3.8 3.7 3.5  CL 104 113* 110 110  CO2 23 23 21* 21*  BUN 18 8 6* 9  CREATININE 1.45* 1.17* 1.02* 1.22*  CALCIUM 8.6* 7.7* 7.9* 8.3*  PROT 6.4* 5.1*  --   --   BILITOT 1.0 0.7  --   --   ALKPHOS 90 66  --   --   ALT 35 30  --   --   AST 42* 34  --   --   GLUCOSE 138* 113* 99 116*    ESR: 28 Lactic acid: 2.1>0.9 Flu A/B: neg Blood culture ID: E.coli  Lipase: 30 C. Diff: pending  Imaging/Diagnostic Tests: Ct Abdomen Pelvis Wo Contrast  Result Date: 04/04/2018 CLINICAL DATA:  Right-sided flank pain EXAM: CT ABDOMEN AND PELVIS WITHOUT CONTRAST TECHNIQUE: Multidetector CT imaging of the abdomen and pelvis was performed following the standard protocol without IV contrast. COMPARISON:  12/26/2015 FINDINGS: Lower chest: Mild scarring is noted in the left base. No focal infiltrate or effusion is seen. Coronary calcifications are noted. Hepatobiliary: No focal liver abnormality is seen. Status post cholecystectomy. No biliary dilatation. Pancreas: Unremarkable. No pancreatic ductal dilatation or surrounding inflammatory changes. Spleen: Normal in size without focal abnormality. Adrenals/Urinary Tract: The adrenal glands are within normal limits bilaterally. Kidneys are well visualize without renal calculi or obstructive changes. The bladder is decompressed. Stomach/Bowel: Mild diverticular changes noted without evidence of diverticulitis. The appendix is well visualized and within normal limits. No small bowel abnormality is seen. Stomach is within normal limits. Vascular/Lymphatic: Aortic atherosclerosis. No enlarged  abdominal or pelvic lymph nodes. Reproductive: Status post hysterectomy. No adnexal masses. Other: No abdominal wall hernia or abnormality. No abdominopelvic ascites. Musculoskeletal: Degenerative changes of the lumbar spine are noted. Chronic L1 compression deformity is noted. IMPRESSION: Chronic changes as described above. No acute abnormality noted. Electronically Signed   By: Inez Catalina M.D.   On: 04/04/2018 13:08   Dg Chest 2 View  Result Date: 04/04/2018 CLINICAL DATA:  Cough and fever EXAM: CHEST - 2 VIEW COMPARISON:  None. FINDINGS: The heart size and mediastinal contours are within normal limits. Aortic atherosclerosis at the arch with slight uncoiling of the thoracic aorta. No aneurysm is identified. Both lungs are clear. ACDF of the lower cervical spine with bilateral multilevel degenerative facet arthropathy. The visualized skeletal structures are unremarkable. IMPRESSION: No active cardiopulmonary disease. Aortic atherosclerosis. Electronically Signed   By: Ashley Royalty M.D.   On: 04/04/2018 00:26   Dg Lumbar Spine 2-3 Views  Result Date: 04/04/2018 CLINICAL DATA:  Pain without trauma EXAM: LUMBAR SPINE - 2-3 VIEW COMPARISON:  CT scan of the abdomen and pelvis April 04, 2018 FINDINGS: Anterior wedging of L1 is stable. No acute fracture or traumatic malalignment. Multilevel degenerative disc disease and lower lumbar facet degenerative changes. Calcified atherosclerosis in the abdominal aorta. IMPRESSION: 1. Chronic compression fracture of L1. 2. No acute fracture or traumatic malalignment. 3. Degenerative changes. Electronically Signed   By: Dorise Bullion III M.D  On: 04/04/2018 18:26   Dg Hip Unilat With Pelvis 2-3 Views Right  Result Date: 04/04/2018 CLINICAL DATA:  General lumbar and right hip pain since yesterday. Fever. Patient denies injuries to right hip or lumbar spine. EXAM: DG HIP (WITH OR WITHOUT PELVIS) 2-3V RIGHT COMPARISON:  None. FINDINGS: Mild degenerative changes in the  hips, left greater than right. No acute fractures or dislocations. IMPRESSION: Negative. Electronically Signed   By: Dorise Bullion III M.D   On: 04/04/2018 18:25    Danna Hefty, DO 04/07/2018, 3:41 PM PGY-1, [place] Intern pager: [phone], text pages welcome

## 2018-04-08 ENCOUNTER — Inpatient Hospital Stay (HOSPITAL_COMMUNITY): Payer: Medicare Other

## 2018-04-08 LAB — CBC
HCT: 34 % — ABNORMAL LOW (ref 36.0–46.0)
Hemoglobin: 10.9 g/dL — ABNORMAL LOW (ref 12.0–15.0)
MCH: 27.6 pg (ref 26.0–34.0)
MCHC: 32.1 g/dL (ref 30.0–36.0)
MCV: 86.1 fL (ref 80.0–100.0)
Platelets: 179 10*3/uL (ref 150–400)
RBC: 3.95 MIL/uL (ref 3.87–5.11)
RDW: 14.3 % (ref 11.5–15.5)
WBC: 4.1 10*3/uL (ref 4.0–10.5)
nRBC: 0 % (ref 0.0–0.2)

## 2018-04-08 LAB — BASIC METABOLIC PANEL
Anion gap: 7 (ref 5–15)
BUN: 8 mg/dL (ref 8–23)
CO2: 21 mmol/L — ABNORMAL LOW (ref 22–32)
Calcium: 8.5 mg/dL — ABNORMAL LOW (ref 8.9–10.3)
Chloride: 112 mmol/L — ABNORMAL HIGH (ref 98–111)
Creatinine, Ser: 1.17 mg/dL — ABNORMAL HIGH (ref 0.44–1.00)
GFR calc Af Amer: 56 mL/min — ABNORMAL LOW (ref >60)
GFR calc non Af Amer: 49 mL/min — ABNORMAL LOW (ref >60)
Glucose, Bld: 100 mg/dL — ABNORMAL HIGH (ref 70–99)
Potassium: 3.8 mmol/L (ref 3.5–5.1)
Sodium: 140 mmol/L (ref 135–145)

## 2018-04-08 MED ORDER — SULFAMETHOXAZOLE-TRIMETHOPRIM 800-160 MG PO TABS: 1.0000 | ORAL_TABLET | Freq: Two times a day (BID) | ORAL | 0 refills | Status: AC

## 2018-04-08 MED ORDER — SACCHAROMYCES BOULARDII 250 MG PO CAPS: 250.0000 mg | ORAL_CAPSULE | Freq: Two times a day (BID) | ORAL | 0 refills | Status: DC

## 2018-04-08 NOTE — Progress Notes (Signed)
Discharged patient, alert, oriented. Discharge instructions given and explained.

## 2018-04-09 LAB — CULTURE, BLOOD (ROUTINE X 2)
Culture: NO GROWTH
Special Requests: ADEQUATE

## 2018-04-14 DIAGNOSIS — M419 Scoliosis, unspecified: Secondary | ICD-10-CM | POA: Diagnosis not present

## 2018-04-14 DIAGNOSIS — N390 Urinary tract infection, site not specified: Secondary | ICD-10-CM | POA: Diagnosis not present

## 2018-04-14 DIAGNOSIS — M519 Unspecified thoracic, thoracolumbar and lumbosacral intervertebral disc disorder: Secondary | ICD-10-CM | POA: Diagnosis not present

## 2018-04-14 DIAGNOSIS — M5136 Other intervertebral disc degeneration, lumbar region: Secondary | ICD-10-CM | POA: Insufficient documentation

## 2018-04-14 DIAGNOSIS — Z8673 Personal history of transient ischemic attack (TIA), and cerebral infarction without residual deficits: Secondary | ICD-10-CM | POA: Diagnosis not present

## 2018-04-14 DIAGNOSIS — M48061 Spinal stenosis, lumbar region without neurogenic claudication: Secondary | ICD-10-CM | POA: Diagnosis not present

## 2018-04-14 DIAGNOSIS — R519 Headache, unspecified: Secondary | ICD-10-CM | POA: Insufficient documentation

## 2018-04-14 DIAGNOSIS — F419 Anxiety disorder, unspecified: Secondary | ICD-10-CM | POA: Insufficient documentation

## 2018-04-16 ENCOUNTER — Encounter: Payer: Self-pay | Admitting: Family Medicine

## 2018-04-16 ENCOUNTER — Ambulatory Visit (INDEPENDENT_AMBULATORY_CARE_PROVIDER_SITE_OTHER): Payer: Medicare Other | Admitting: Family Medicine

## 2018-04-16 VITALS — BP 145/70 | HR 91 | Temp 98.5°F | Wt 172.0 lb

## 2018-04-16 DIAGNOSIS — M170 Bilateral primary osteoarthritis of knee: Secondary | ICD-10-CM | POA: Diagnosis not present

## 2018-04-16 DIAGNOSIS — Z09 Encounter for follow-up examination after completed treatment for conditions other than malignant neoplasm: Secondary | ICD-10-CM | POA: Diagnosis not present

## 2018-04-16 DIAGNOSIS — G43809 Other migraine, not intractable, without status migrainosus: Secondary | ICD-10-CM | POA: Diagnosis not present

## 2018-04-16 DIAGNOSIS — M25462 Effusion, left knee: Secondary | ICD-10-CM | POA: Diagnosis not present

## 2018-04-16 DIAGNOSIS — N12 Tubulo-interstitial nephritis, not specified as acute or chronic: Secondary | ICD-10-CM | POA: Diagnosis not present

## 2018-04-16 DIAGNOSIS — G43701 Chronic migraine without aura, not intractable, with status migrainosus: Secondary | ICD-10-CM

## 2018-04-16 MED ORDER — PROPRANOLOL HCL 10 MG PO TABS: 10.0000 mg | ORAL_TABLET | Freq: Two times a day (BID) | ORAL | 3 refills | Status: DC

## 2018-04-16 MED ORDER — ONDANSETRON HCL 4 MG PO TABS: 4.0000 mg | ORAL_TABLET | Freq: Three times a day (TID) | ORAL | 0 refills | Status: DC | PRN

## 2018-04-16 NOTE — Assessment & Plan Note (Signed)
Improving on antibiotics, no fever, chills and is tolerating po well. No red flags on exam.  -Rx: zofran ODT for nausea Advised to complete course of Bactrim as prescribed even though she is feeling better.  Return precautions given, she expresses good understanding

## 2018-04-16 NOTE — Progress Notes (Signed)
   Subjective:   Patient ID: [name]    DOB: [birth year], 68 y.o. female   MRN: [id number]  CC: hospital f/u   HPI: [name] is a 68 y.o. female who presents to clinic today for hospital f/u.  Pyelonephritis Admitted 04/03/2018 for pyelonephritis and discharged 04/08/2018.  During her hospitalization she was treated with IV Rocephin and transitioned to po Bactrim to complete a 14-day course. She is still taking her prescribed antibiotic course and has a few more days left.  She was having soft stools while admitted likely 2/2 antibiotic use, however this has since resolved.  No fever, chills.  Endorses some nausea but no vomiting or flank pain.   Has been tolerating po and felt well since leaving the hospital.   Of note, her propranolol was held during hospitalization due to soft blood pressures and deferred to this visit to restart.   She reports she has had worsening migraine headaches since then. Endorses light and sound sensitivity.   Her other medications of tizanidine, zonegran and doxipen were continued. She has had adequate water intake and sleep.  Feels like this will improve with restarting her propranolol.   Cough on day of discharge with negative CXR, thought to be 2/2 viral illness. She reports resolution in her symptoms and has not needed to take any OTC medications for this.  Denies SOB, wheeze, runny nose, cough and congestion.   ROS: See HPI for pertinent ROS.  Social: pt is a former smoker, quit 2013  Medications reviewed. Objective:   BP (!) 145/70   Pulse 91   Temp 98.5 F (36.9 C) (Oral)   Wt 172 lb (78 kg)   SpO2 96%   BMI 26.94 kg/m  Vitals and nursing note reviewed.  General: Pleasant 68 year old female, NAD HEENT: NCAT, EOMI, PERRL, MMM, oropharynx clear Neck: supple CV: RRR no MRG  Lungs: CTAB, normal effort  Abdomen: soft, NTND, no masses or organomegaly, +bs, no CVA tenderness Skin: warm, dry, no rash Extremities: warm and well  perfused  Assessment & Plan:   Pyelonephritis Improving on antibiotics, no fever, chills and is tolerating po well. No red flags on exam.  -Rx: zofran ODT for nausea Advised to complete course of Bactrim as prescribed even though she is feeling better.  Return precautions given, she expresses good understanding   Chronic migraine without aura, with status migrainosus Propranolol held on hospitalization 2/2 soft blood pressures.   BP 145/70 at hospital f/u today and pt c/o migraines since discharge.   Discussed restarting this today as BP permits.   No red flags on exam and she denies dizziness, lightheadedness.   Meds ordered this encounter  Medications  . ondansetron (ZOFRAN) 4 MG tablet    Sig: Take 1 tablet (4 mg total) by mouth every 8 (eight) hours as needed for nausea or vomiting.    Dispense:  20 tablet    Refill:  0  . propranolol (INDERAL) 10 MG tablet    Sig: Take 1 tablet (10 mg total) by mouth 2 (two) times daily.    Dispense:  60 tablet    Refill:  3   Lovenia Kim, MD [place] PGY-3

## 2018-04-16 NOTE — Patient Instructions (Signed)
It was nice seeing you again today.  You were seen in clinic for a hospital follow-up and we discussed your improvement on antibiotics for your kidney infection.  I would advise completing the rest of Bactrim even if you begin to feel better.   I have also prescribed some Zofran for nausea. If you have any fever, chills, worsening nausea or flank pain please make sure to be seen by a provider.  Additionally, we discussed restarting your propranolol as your blood pressures are no longer low.  This will help with preventing your migraine headaches.  Please call clinic if you have any questions.  Lovenia Kim MD

## 2018-04-16 NOTE — Assessment & Plan Note (Signed)
Propranolol held on hospitalization 2/2 soft blood pressures.   BP 145/70 at hospital f/u today and pt c/o migraines since discharge.   Discussed restarting this today as BP permits.   No red flags on exam and she denies dizziness, lightheadedness.

## 2018-04-20 ENCOUNTER — Telehealth: Payer: Self-pay

## 2018-04-20 ENCOUNTER — Encounter: Payer: Self-pay | Admitting: Cardiology

## 2018-04-20 DIAGNOSIS — N183 Chronic kidney disease, stage 3 unspecified: Secondary | ICD-10-CM | POA: Insufficient documentation

## 2018-04-20 DIAGNOSIS — I739 Peripheral vascular disease, unspecified: Secondary | ICD-10-CM | POA: Insufficient documentation

## 2018-04-20 NOTE — Telephone Encounter (Signed)
   [facility] Pre-operative Risk Assessment    Request for surgical clearance:  1. What type of surgery is being performed? Lumbar ESI injection  2. When is this surgery scheduled? 05/04/18  3. What type of clearance is required (medical clearance vs. Pharmacy clearance to hold med vs. Both)? Both  4. Are there any medications that need to be held prior to surgery and how long? Hold Plavix 7 days prior to injection  5. Practice name and name of physician performing surgery? [facility]  Dr.Jeffrey Beane   6. What is your office phone number [phone]   7.   What is your office fax number [phone]  8.   Anesthesia type  Not listed   Kathyrn Lass 04/20/2018, 9:35 AM  _________________________________________________________________   (provider comments below)

## 2018-04-20 NOTE — Telephone Encounter (Signed)
Pt called nurse line requesting her PCP write a letter stating she was in the hospital from 04/03/2018-04/08/2018. Please call her when this is ready for pick up.

## 2018-04-20 NOTE — Telephone Encounter (Signed)
Please let the requesting provider know they need to contact neurology for clearance to take the patient off of plavix. Plavix was started during her admission with R sided weakness on 10/10/2013 during for evaluation of severe migraine and possible code stroke.

## 2018-04-21 ENCOUNTER — Ambulatory Visit (INDEPENDENT_AMBULATORY_CARE_PROVIDER_SITE_OTHER): Payer: Medicare Other | Admitting: Cardiology

## 2018-04-21 ENCOUNTER — Encounter: Payer: Self-pay | Admitting: Cardiology

## 2018-04-21 VITALS — BP 90/4 | Ht 67.0 in | Wt 169.0 lb

## 2018-04-21 DIAGNOSIS — I2584 Coronary atherosclerosis due to calcified coronary lesion: Secondary | ICD-10-CM

## 2018-04-21 DIAGNOSIS — N183 Chronic kidney disease, stage 3 unspecified: Secondary | ICD-10-CM

## 2018-04-21 DIAGNOSIS — I739 Peripheral vascular disease, unspecified: Secondary | ICD-10-CM | POA: Diagnosis not present

## 2018-04-21 DIAGNOSIS — Z01818 Encounter for other preprocedural examination: Secondary | ICD-10-CM | POA: Diagnosis not present

## 2018-04-21 DIAGNOSIS — I2699 Other pulmonary embolism without acute cor pulmonale: Secondary | ICD-10-CM

## 2018-04-21 DIAGNOSIS — I10 Essential (primary) hypertension: Secondary | ICD-10-CM | POA: Diagnosis not present

## 2018-04-21 DIAGNOSIS — R0609 Other forms of dyspnea: Secondary | ICD-10-CM

## 2018-04-21 DIAGNOSIS — G43809 Other migraine, not intractable, without status migrainosus: Secondary | ICD-10-CM | POA: Diagnosis not present

## 2018-04-21 DIAGNOSIS — Z09 Encounter for follow-up examination after completed treatment for conditions other than malignant neoplasm: Secondary | ICD-10-CM

## 2018-04-21 DIAGNOSIS — I2510 Atherosclerotic heart disease of native coronary artery without angina pectoris: Secondary | ICD-10-CM | POA: Diagnosis not present

## 2018-04-21 DIAGNOSIS — E785 Hyperlipidemia, unspecified: Secondary | ICD-10-CM

## 2018-04-21 DIAGNOSIS — R0600 Dyspnea, unspecified: Secondary | ICD-10-CM

## 2018-04-21 NOTE — Assessment & Plan Note (Signed)
Dr Willis follows 

## 2018-04-21 NOTE — Assessment & Plan Note (Addendum)
Intermediate VQ may 2019- Rx'd with Eliquis but this was stopped in June

## 2018-04-21 NOTE — Patient Instructions (Signed)
Medication Instructions:  STOP Altace OK TO HOLD PLAVIX 5 DAYS PRIOR TO BACK INJECTION If you need a refill on your cardiac medications before your next appointment, please call your pharmacy.   Lab work: None  If you have labs (blood work) drawn today and your tests are completely normal, you will receive your results only by: Marland Kitchen MyChart Message (if you have MyChart) OR . A paper copy in the mail If you have any lab test that is abnormal or we need to change your treatment, we will call you to review the results.  Testing/Procedures: None   Follow-Up: At [facility], you and your health needs are our priority.  As part of our continuing mission to provide you with exceptional heart care, we have created designated Provider Care Teams.  These Care Teams include your primary Cardiologist (physician) and Advanced Practice Providers (APPs -  Physician Assistants and Nurse Practitioners) who all work together to provide you with the care you need, when you need it. You will need a follow up appointment in 6 months.  Please call our office 2 months in advance to schedule this appointment.  You may see Skeet Latch, MD or one of the following Advanced Practice Providers on your designated Care Team:   Kerin Ransom, PA-C Roby Lofts, [place] . Sande Rives, PA-C  Any Other Special Instructions Will Be Listed Below (If Applicable).

## 2018-04-21 NOTE — Progress Notes (Signed)
04/21/2018 [name]   1950-11-22  [id number]  Primary Physician Lovenia Kim, MD Primary Cardiologist: Dr Oval Linsey  HPI: Patient is a pleasant 68 year old female seen by cardiology in the past.  She has documented coronary calcification on prior CT scan as an incidental finding.  She had a low risk Myoview in July 2019, echocardiogram in May 2019 showed an ejection fraction of 55 to 60%.   She has multiple other medical issues including PVD with a history of vertebral artery stenosis, chronic renal insufficiency 3, treated hypertension, dyslipidemia, suspected pulmonary embolism by VQ scan in May 2019 treated with Eliquis, and migraines followed by Dr. Jannifer Franklin. There is mention of LSCA stenosis in the records but vascular US in July 2019 did not show this. The patient was put on Plavix in August 2015 by the hospital neurologist when she presented with a suspected stroke.  Her MRI was negative for stroke but her carotid CT angiogram showed moderate to severe left vertebral artery stenosis.  The patient was recently hospitalized January 20 5 January 31st 2020 for a UTI.  She is in the office today for preop clearance prior to spine injection.  The patient has chronic DOE-she gets SOB walking up a flight of stairs.  This has not changed.  She denies chest pain or tightness.  She has seen a pulmonologist about this in the past (? Nothing in Epic) no specific diagnosis.  She has been doing well since her recent hospitalization.  In the office her B/P is low in both arms- 90 systolic but she is asymptomatic, no syncope or pre syncope.    Current Outpatient Medications  Medication Sig Dispense Refill  . clopidogrel (PLAVIX) 75 MG tablet TAKE 1 TABLET BY MOUTH DAILY (Patient taking differently: Take 75 mg by mouth daily. ) 30 tablet 5  . doxepin (SINEQUAN) 100 MG capsule Take 2 capsules (200 mg total) by mouth at bedtime. 180 capsule 1  . esomeprazole (NEXIUM) 20 MG capsule Take 1 capsule (20  mg total) by mouth daily. 30 capsule 5  . ezetimibe (ZETIA) 10 MG tablet Take 10 mg by mouth daily.     Marland Kitchen gabapentin (NEURONTIN) 600 MG tablet TAKE 1 TABLET BY MOUTH TWICE DAILY (Patient taking differently: Take 600 mg by mouth 2 (two) times daily. ) 180 tablet 1  . lidocaine (LIDODERM) 5 % Place 2 patches onto the skin daily. Apply to 2 patches to affected area in the AM, then remove after 12 hours 60 patch 5  . meclizine (ANTIVERT) 25 MG tablet Take 1 tablet (25 mg total) by mouth 2 (two) times daily as needed for dizziness. 20 tablet 0  . ondansetron (ZOFRAN) 4 MG tablet Take 1 tablet (4 mg total) by mouth every 8 (eight) hours as needed for nausea or vomiting. 20 tablet 0  . propranolol (INDERAL) 10 MG tablet Take 1 tablet (10 mg total) by mouth 2 (two) times daily. 60 tablet 3  . tiZANidine (ZANAFLEX) 2 MG tablet Take 1 tablet (2 mg total) by mouth 3 (three) times daily. 270 tablet 3  . topiramate (TOPAMAX) 100 MG tablet Take 100 mg by mouth 2 (two) times daily.     Marland Kitchen zonisamide (ZONEGRAN) 100 MG capsule Take 1 capsule (100 mg total) by mouth 2 (two) times daily. 60 capsule 11   No current facility-administered medications for this visit.     Allergies  Allergen Reactions  . Iodinated Diagnostic Agents Shortness Of Breath and Itching  States she had this reaction connected with a MRI of the cervical spine.  . Shellfish Allergy Anaphylaxis and Other (See Comments)    All seafood  . Statins Other (See Comments)    Arthralgias (severe) with atorvastatin, mild arthralgias with rosuvastatin but willing to continue taking it    Past Medical History:  Diagnosis Date  . Chronic kidney disease    stage 3  . GERD (gastroesophageal reflux disease)   . Hyperlipidemia   . Migraine headache   . Occipital neuralgia    Left  . Renal insufficiency   . Stroke [facility]) 10/2013    Social History   Socioeconomic History  . Marital status: Married    Spouse name: [name]  . Number of children: 1    . Years of education: 12+  . Highest education level: Not on file  Occupational History  . Occupation: ACTIVITY DIRECTOR    Employer: [employer]  . Occupation: CNA  Social Needs  . Financial resource strain: Not on file  . Food insecurity:    Worry: Not on file    Inability: Not on file  . Transportation needs:    Medical: Not on file    Non-medical: Not on file  Tobacco Use  . Smoking status: Former Smoker    Packs/day: 0.30    Types: Cigarettes    Last attempt to quit: 11/01/2011    Years since quitting: 6.4  . Smokeless tobacco: Never Used  Substance and Sexual Activity  . Alcohol use: No    Alcohol/week: 0.0 standard drinks  . Drug use: No  . Sexual activity: Yes    Partners: Male    Birth control/protection: Surgical    Comment: 1 sexual partner in last 12 months: married  Lifestyle  . Physical activity:    Days per week: Not on file    Minutes per session: Not on file  . Stress: Not on file  Relationships  . Social connections:    Talks on phone: Not on file    Gets together: Not on file    Attends religious service: Not on file    Active member of club or organization: Not on file    Attends meetings of clubs or organizations: Not on file    Relationship status: Not on file  . Intimate partner violence:    Fear of current or ex partner: Not on file    Emotionally abused: Not on file    Physically abused: Not on file    Forced sexual activity: Not on file  Other Topics Concern  . Not on file  Social History Narrative   Patient is married ([name]).   Patient has one child.   Patient is retired   Patient is right handed.   Patient has a college education.   Patient drinks decaf coffee      Family History  Problem Relation Age of Onset  . Cancer Mother 81       breast  . Alzheimer's disease Mother   . Cancer Brother   . Hyperlipidemia Brother   . Prostate cancer Father   . Lung cancer Father   . Cancer Brother   . Hyperlipidemia Brother    . Dementia Brother        frontotemporal dementia     Review of Systems: General: negative for chills, fever, night sweats or weight changes.  Cardiovascular: negative for chest pain, edema, orthopnea, palpitations, paroxysmal nocturnal dyspnea or shortness of breath Dermatological: negative for rash  Respiratory: negative for cough or wheezing Urologic: negative for hematuria Abdominal: negative for nausea, vomiting, diarrhea, bright red blood per rectum, melena, or hematemesis Neurologic: negative for visual changes, syncope, or dizziness All other systems reviewed and are otherwise negative except as noted above.    Blood pressure (!) 90/4, height 5\' 7"  (1.702 m), weight 169 lb (76.7 kg).  General appearance: alert, cooperative and no distress Neck: no carotid bruit and no JVD Lungs: clear to auscultation bilaterally Heart: regular rate and rhythm Extremities: no edema Pulses: 2+/ 4 radial pulses Skin: warm and dry Neurologic: Grossly normal  EKG NSR- 70  ASSESSMENT AND PLAN:   Pre-operative clearance Pt seen today for pre op clearance- OK to stop Plavix for spine injection  Coronary artery calcification Low risk Myoview July 2019- normal LVF by echo may 2019  Dyspnea Chronic- mild bronchitic changes on CT- Will defer further work up to PCP  Essential hypertension Hypotensive today- asymptomatic. Stop Altace  Hyperlipidemia LDL goal <70 She is off Lipitor- myalgias.  She tells me she never heard from the [facility], I'll check on this  PVD (peripheral vascular disease) ([place]) Moderate to severe left vertebral artery stenosis picked up during admission Aug 2015- Plavix started that admission by hospital neurologist  Pulmonary embolism [facility]) Intermediate VQ may 2019- Rx'd with Eliquis but this was stopped in June   Migraine variant Dr Jannifer Franklin follows   [place] for back injection from cardiac standpoint. Stop Altace. OK to hold Plavix if needed. I will fax  clearance via [place] PA-C 04/21/2018 8:20 AM

## 2018-04-21 NOTE — Assessment & Plan Note (Signed)
Hypotensive today- asymptomatic. Stop Altace

## 2018-04-21 NOTE — Telephone Encounter (Signed)
Note faxed to Dr Tonita Cong via [place].

## 2018-04-21 NOTE — Assessment & Plan Note (Signed)
Moderate to severe left vertebral artery stenosis picked up during admission Aug 2015- Plavix started that admission by hospital neurologist

## 2018-04-21 NOTE — Telephone Encounter (Signed)
   Primary Cardiologist: Skeet Latch, MD  Chart reviewed and patient seen in the office today as part of pre-operative protocol coverage. Given past medical history and time since last visit, based on ACC/AHA guidelines, [name] would be at acceptable risk for the planned procedure without further cardiovascular testing.   Ok to hold Plavix 5-7 days pre op if needed.  I will route this recommendation to the requesting party via Epic fax function and remove from pre-op pool.  Please call with questions.  Kerin Ransom, PA-C 04/21/2018, 8:24 AM

## 2018-04-21 NOTE — Assessment & Plan Note (Addendum)
Chronic- mild bronchitic changes on CT- Will defer further work up to PCP

## 2018-04-21 NOTE — Assessment & Plan Note (Signed)
Low risk Myoview July 2019- normal LVF by echo may 2019

## 2018-04-21 NOTE — Assessment & Plan Note (Signed)
She is off Lipitor- myalgias.  She tells me she never heard from the [facility], I'll check on this

## 2018-04-21 NOTE — Assessment & Plan Note (Signed)
Pt seen today for pre op clearance- OK to stop Plavix for spine injection

## 2018-04-22 NOTE — Telephone Encounter (Signed)
Called pt to inform her the letter is ready.  She states she can pick it up tomorrow at her appointment with me as she will be coming in again to f/u on her infection.

## 2018-04-23 ENCOUNTER — Telehealth: Payer: Self-pay

## 2018-04-23 ENCOUNTER — Ambulatory Visit (INDEPENDENT_AMBULATORY_CARE_PROVIDER_SITE_OTHER): Payer: Medicare Other | Admitting: Family Medicine

## 2018-04-23 VITALS — BP 130/70 | HR 90 | Temp 98.7°F | Wt 173.4 lb

## 2018-04-23 DIAGNOSIS — N23 Unspecified renal colic: Secondary | ICD-10-CM | POA: Diagnosis not present

## 2018-04-23 DIAGNOSIS — I2584 Coronary atherosclerosis due to calcified coronary lesion: Secondary | ICD-10-CM | POA: Diagnosis not present

## 2018-04-23 DIAGNOSIS — I2510 Atherosclerotic heart disease of native coronary artery without angina pectoris: Secondary | ICD-10-CM

## 2018-04-23 DIAGNOSIS — R109 Unspecified abdominal pain: Secondary | ICD-10-CM | POA: Diagnosis not present

## 2018-04-23 LAB — POCT URINALYSIS DIP (MANUAL ENTRY)
Bilirubin, UA: NEGATIVE
Blood, UA: NEGATIVE
Glucose, UA: NEGATIVE mg/dL
Ketones, POC UA: NEGATIVE mg/dL
Leukocytes, UA: NEGATIVE
Nitrite, UA: NEGATIVE
Protein Ur, POC: NEGATIVE mg/dL
Spec Grav, UA: <=1.005 — AB (ref 1.010–1.025)
Urobilinogen, UA: 0.2 E.U./dL
pH, UA: 6 (ref 5.0–8.0)

## 2018-04-23 NOTE — Patient Instructions (Signed)
It was nice seeing you again today.  You were evaluated in clinic for right flank pain that has returned.  As we discussed, I would like to schedule you for a renal ultrasound to take a good look at your kidneys and rule out an abscess.   We have scheduled you at [facility] imaging this afternoon and we will follow-up with the results.  In the meantime, if you have any fevers chills, worsening nausea or vomiting please make sure you are seen by a provider.  You may call clinic if you have any questions.  Lovenia Kim MD

## 2018-04-23 NOTE — Progress Notes (Signed)
   Subjective:   Patient ID: [name]    DOB: 1950-04-10, 69 y.o. female   MRN: [id number]  CC: right flank pain   HPI: [name] is a 68 y.o. female who presents to clinic today for the following issue.  Right flank pain Feels like her kidney infection might be coming back.  Recent hospitalization from 1/25-1/30/2020 for right pyelonephritis.  She denies having fevers or chills at home.  Endorses nausea but no vomiting.  Has been eating and drinking plenty of fluids.  Was prescribed zofran for this at her hospital follow up on 04/16/2018 which has been helping some but has not relieved it all.  Has completed her course of po Bactrim yesterday for a total of 14 days of antibiotics.  Denies abdominal or suprapubic pain, dysuria, frequency or urgency.   ROS: See HPI for pertinent ROS.  Social: pt is a former smoker, quit 2013.  Medications reviewed. Objective:   BP 130/70   Pulse 90   Temp 98.7 F (37.1 C) (Oral)   Wt 173 lb 6.4 oz (78.7 kg)   SpO2 97%   BMI 27.16 kg/m  Vitals and nursing note reviewed.  General: 68 year old female, NAD Neck: supple CV: RRR no MRG  Lungs: CTAB, normal effort  Abdomen: soft, NTND, +CVA tenderness of right side, +bs  Skin: warm, dry, no rash, brisk cap refill  Extremities: warm and well perfused   Assessment & Plan:   R flank pain  Has returned since hospital follow up on 04/16/2018.  She completed appropriate po course of 14 days of antibiotics.  Vitals reassuring and UA negative at OV today. No hematuria or urinary symptoms reported. Low suspicion for stone at this time given negative CT A/P in ED she is otherwise comfortable-appearing.  Precepted with Dr. Mingo Amber.  No concerning signs to warrant sending to ED at this time, however will schedule her for stat renal ultrasound to evaluate for hydronephrosis or evidence of abscess.  Possible this may also be MSK related as she has degenerative changes of lumbar spine, antiinflammatories  advised and strict return precautions discussed.   Orders Placed This Encounter  Procedures  . US Renal    Standing Status:   Future    Standing Expiration Date:   06/22/2019    Order Specific Question:   Reason for Exam (SYMPTOM  OR DIAGNOSIS REQUIRED)    Answer:   right flank pain with h/o recent pyelonephritis    Order Specific Question:   Preferred imaging location?    Answer:   [facility]  . POCT urinalysis dipstick   Lovenia Kim, MD [place] PGY-3

## 2018-04-24 ENCOUNTER — Other Ambulatory Visit: Payer: Self-pay | Admitting: Adult Health

## 2018-04-26 ENCOUNTER — Ambulatory Visit
Admission: RE | Admit: 2018-04-26 | Discharge: 2018-04-26 | Disposition: A | Discharge status: Home / Self Care | Payer: Medicare Other | Source: Ambulatory Visit | Attending: Family Medicine | Admitting: Family Medicine

## 2018-04-26 ENCOUNTER — Telehealth: Payer: Self-pay

## 2018-04-26 DIAGNOSIS — M48061 Spinal stenosis, lumbar region without neurogenic claudication: Secondary | ICD-10-CM | POA: Diagnosis not present

## 2018-04-26 DIAGNOSIS — R109 Unspecified abdominal pain: Secondary | ICD-10-CM

## 2018-04-26 DIAGNOSIS — M5136 Other intervertebral disc degeneration, lumbar region: Secondary | ICD-10-CM | POA: Diagnosis not present

## 2018-04-26 MED ORDER — EVOLOCUMAB 140 MG/ML ~~LOC~~ SOAJ: 140.0000 mg | SUBCUTANEOUS | 6 refills | Status: DC

## 2018-04-26 NOTE — Telephone Encounter (Signed)
Called to let the pt know that the insurance approved the pa for the repatha and that the rx was sent to [place] and to call us back if the copay is unaffordable

## 2018-04-28 NOTE — Telephone Encounter (Signed)
Addend to chart chief complaint.  [name], [place]

## 2018-05-03 DIAGNOSIS — M48061 Spinal stenosis, lumbar region without neurogenic claudication: Secondary | ICD-10-CM | POA: Diagnosis not present

## 2018-05-04 DIAGNOSIS — M5136 Other intervertebral disc degeneration, lumbar region: Secondary | ICD-10-CM | POA: Diagnosis not present

## 2018-05-07 DIAGNOSIS — M48061 Spinal stenosis, lumbar region without neurogenic claudication: Secondary | ICD-10-CM | POA: Diagnosis not present

## 2018-05-10 DIAGNOSIS — M48061 Spinal stenosis, lumbar region without neurogenic claudication: Secondary | ICD-10-CM | POA: Diagnosis not present

## 2018-05-14 DIAGNOSIS — M170 Bilateral primary osteoarthritis of knee: Secondary | ICD-10-CM | POA: Diagnosis not present

## 2018-05-14 DIAGNOSIS — M1712 Unilateral primary osteoarthritis, left knee: Secondary | ICD-10-CM | POA: Diagnosis not present

## 2018-05-14 DIAGNOSIS — M25562 Pain in left knee: Secondary | ICD-10-CM | POA: Diagnosis not present

## 2018-05-14 DIAGNOSIS — M25561 Pain in right knee: Secondary | ICD-10-CM | POA: Diagnosis not present

## 2018-05-17 DIAGNOSIS — M48061 Spinal stenosis, lumbar region without neurogenic claudication: Secondary | ICD-10-CM | POA: Diagnosis not present

## 2018-05-18 ENCOUNTER — Ambulatory Visit (INDEPENDENT_AMBULATORY_CARE_PROVIDER_SITE_OTHER): Payer: Medicare Other | Admitting: Family Medicine

## 2018-05-18 ENCOUNTER — Other Ambulatory Visit: Payer: Self-pay

## 2018-05-18 VITALS — BP 122/60 | HR 65 | Temp 98.0°F | Ht 67.0 in | Wt 171.0 lb

## 2018-05-18 DIAGNOSIS — M545 Low back pain, unspecified: Secondary | ICD-10-CM | POA: Insufficient documentation

## 2018-05-18 DIAGNOSIS — I2510 Atherosclerotic heart disease of native coronary artery without angina pectoris: Secondary | ICD-10-CM | POA: Diagnosis not present

## 2018-05-18 DIAGNOSIS — I2584 Coronary atherosclerosis due to calcified coronary lesion: Secondary | ICD-10-CM

## 2018-05-18 LAB — POCT URINALYSIS DIP (MANUAL ENTRY)
Bilirubin, UA: NEGATIVE
Blood, UA: NEGATIVE
Glucose, UA: NEGATIVE mg/dL
Ketones, POC UA: NEGATIVE mg/dL
Leukocytes, UA: NEGATIVE
Nitrite, UA: NEGATIVE
Protein Ur, POC: NEGATIVE mg/dL
Spec Grav, UA: 1.015 (ref 1.010–1.025)
Urobilinogen, UA: 0.2 E.U./dL
pH, UA: 5.5 (ref 5.0–8.0)

## 2018-05-18 NOTE — Progress Notes (Signed)
Subjective:    [name] - 68 y.o. female MRN [id number]  Date of birth: 07-Feb-1951  CC:  [name] is here for right sided back pain and fever.  HPI: [name] has helped relieve her right side pain, but the pain worsened after initially improving - has had malodorous urine over the past week that is still occurring - has had some nausea and a fever of 100-101 Friday and Saturday of last week.  Had chills on Sunday. - denies urinary urgency, frequency, hesitancy - has had a poor appetite due to nausea since Friday - has taken Zofran which has been somewhat helpful - pain in R side not exacerbated or alleviated by movement - took Tylenol this morning at [place] Maintenance:  Health Maintenance Due  Topic Date Due  . COLONOSCOPY  10/31/2001  . MAMMOGRAM  07/01/2015  . DEXA SCAN  10/31/2016  . INFLUENZA VACCINE  10/08/2017    -  reports that she quit smoking about 6 years ago. Her smoking use included cigarettes. She smoked 0.30 packs per day. She has never used smokeless tobacco. - Review of Systems: Per HPI. - Past Medical History: Patient Active Problem List   Diagnosis Date Noted  . Low back pain 05/18/2018  . Pre-operative clearance 04/21/2018  . PVD (peripheral vascular disease) ([place]) 04/20/2018  . CRI (chronic renal insufficiency), stage 3 (moderate) ([place]) 04/20/2018  . Pyelonephritis 04/05/2018  . Urinary tract infection 04/04/2018  . Plantar fasciitis 01/10/2018  . Chronic anxiety 01/08/2018  . Gastroesophageal reflux disease 01/08/2018  . Menopausal syndrome 01/08/2018  . Leg swelling 08/24/2017  . Right leg pain   . Pulmonary embolism ([place])   . Coronary artery calcification   . Dyspnea 07/26/2017  . Osteoarthritis of knee 04/08/2017  . Hospital discharge follow-up 01/25/2016  . Status post cholecystectomy   . Sepsis ([place]) 12/11/2015  . Hyperlipidemia LDL goal <70 04/02/2015  . Chronic migraine without aura, with status migrainosus  09/26/2014  . Migraine variant 10/10/2013  . TIA (transient ischemic attack) 10/10/2013  . Essential hypertension 07/18/2013  . 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 Neuralgia, neuritis, and radiculitis, unspecified 02/04/2012  . Myofascial muscle pain 07/15/2011   - Medications: reviewed and updated   Objective:   Physical Exam BP 122/60   Pulse 65   Temp 98 F (36.7 C) (Oral)   Ht 5\' 7"  (1.702 m)   Wt 171 lb (77.6 kg)   SpO2 97%   BMI 26.78 kg/m  Gen: NAD, alert, cooperative with exam, well-appearing CV: faint heart sounds, regular rhythm, normal rate, no edema  Resp: CTABL, no wheezes, non-labored Abd: SNTND, BS present, no guarding or organomegaly, R CVA tenderness, no suprapubic tenderness Skin: no rashes, normal turgor  Psych: good insight, alert and oriented        Assessment & Plan:   Low back pain Although patient's flank pain and malodorous urine are concerning for a urinary tract infection, her UA was completely normal, making this highly unlikely.  I am also reassured by her normal kidney ultrasound a few weeks ago.  Patient was reassured and encouraged to use Azo UTI medication if she is worried about her symptoms.  She was also given return precautions, including return of temperatures greater than 100.4, return of chills, and increased urinary symptoms.  She was encouraged to use Tylenol for her side pain and told that this could also be musculoskeletal in origin.    [name], M.D. 05/18/2018,  9:09 AM PGY-2, [place]

## 2018-05-18 NOTE — Assessment & Plan Note (Signed)
Although patient's flank pain and malodorous urine are concerning for a urinary tract infection, her UA was completely normal, making this highly unlikely.  I am also reassured by her normal kidney ultrasound a few weeks ago.  Patient was reassured and encouraged to use Azo UTI medication if she is worried about her symptoms.  She was also given return precautions, including return of temperatures greater than 100.4, return of chills, and increased urinary symptoms.  She was encouraged to use Tylenol for her side pain and told that this could also be musculoskeletal in origin.

## 2018-05-18 NOTE — Patient Instructions (Signed)
It was nice meeting you today Ms. Helvey!  Your urine is completely normal today, so you do not have a urinary tract infection.  If you continue to have temperatures greater than 100.4, develop chills again, or have increased urinary symptoms, please come back in to be seen.  You can try Azo urinary tract medication, which can help prevent UTIs if you would like.  You can find this at the drugstore or at [facility].  Continue to use Zofran, Tylenol, and Aleve for your nausea and side pain.  I think your side pain may have a musculoskeletal cause.  Use Tylenol more frequently than Aleve, since Aleve can be hard on your kidneys over time.  If you have any questions or concerns, please feel free to call the clinic.   Be well,  Dr. Shan Levans

## 2018-05-21 DIAGNOSIS — M48061 Spinal stenosis, lumbar region without neurogenic claudication: Secondary | ICD-10-CM | POA: Diagnosis not present

## 2018-06-01 ENCOUNTER — Ambulatory Visit: Payer: Self-pay

## 2018-06-01 ENCOUNTER — Ambulatory Visit: Payer: Self-pay | Admitting: Physical Medicine & Rehabilitation

## 2018-06-04 ENCOUNTER — Encounter: Payer: Medicare Other | Attending: Physical Medicine & Rehabilitation

## 2018-06-04 ENCOUNTER — Encounter: Payer: Self-pay | Admitting: Physical Medicine & Rehabilitation

## 2018-06-04 ENCOUNTER — Other Ambulatory Visit: Payer: Self-pay

## 2018-06-04 ENCOUNTER — Ambulatory Visit (HOSPITAL_BASED_OUTPATIENT_CLINIC_OR_DEPARTMENT_OTHER): Payer: Medicare Other | Admitting: Physical Medicine & Rehabilitation

## 2018-06-04 VITALS — BP 96/66 | HR 64 | Ht 67.0 in | Wt 174.4 lb

## 2018-06-04 DIAGNOSIS — Z9181 History of falling: Secondary | ICD-10-CM | POA: Insufficient documentation

## 2018-06-04 DIAGNOSIS — Z809 Family history of malignant neoplasm, unspecified: Secondary | ICD-10-CM | POA: Insufficient documentation

## 2018-06-04 DIAGNOSIS — Z87891 Personal history of nicotine dependence: Secondary | ICD-10-CM | POA: Insufficient documentation

## 2018-06-04 DIAGNOSIS — Z801 Family history of malignant neoplasm of trachea, bronchus and lung: Secondary | ICD-10-CM | POA: Insufficient documentation

## 2018-06-04 DIAGNOSIS — Z8042 Family history of malignant neoplasm of prostate: Secondary | ICD-10-CM | POA: Insufficient documentation

## 2018-06-04 DIAGNOSIS — Z9049 Acquired absence of other specified parts of digestive tract: Secondary | ICD-10-CM | POA: Diagnosis not present

## 2018-06-04 DIAGNOSIS — Z803 Family history of malignant neoplasm of breast: Secondary | ICD-10-CM | POA: Insufficient documentation

## 2018-06-04 DIAGNOSIS — I2584 Coronary atherosclerosis due to calcified coronary lesion: Secondary | ICD-10-CM

## 2018-06-04 DIAGNOSIS — N183 Chronic kidney disease, stage 3 (moderate): Secondary | ICD-10-CM | POA: Insufficient documentation

## 2018-06-04 DIAGNOSIS — I2510 Atherosclerotic heart disease of native coronary artery without angina pectoris: Secondary | ICD-10-CM | POA: Diagnosis not present

## 2018-06-04 DIAGNOSIS — G588 Other specified mononeuropathies: Secondary | ICD-10-CM

## 2018-06-04 DIAGNOSIS — G580 Intercostal neuropathy: Secondary | ICD-10-CM | POA: Insufficient documentation

## 2018-06-04 NOTE — Progress Notes (Signed)
Subjective:    Patient ID: [name], female    DOB: Aug 07, 1950, 68 y.o.   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  She is now retired.  She does continue to experience pain in the left lateral ribs but this is mainly with pressure or with certain movements. She feels like her medication i.e. lidocaine patch plus gabapentin are helpful [place] in January but did not see MD until ~2wks afterward.  Sees Dr Tonita Cong at [facility], according to the patient she was told that she broke a couple bones and may need some surgery because of compression of nerves.  Pt using heat and Ice  On Zonegran for migraines, no longer on Topamax or tizanidine or antivert  Repatha injection for cholesterol   Pain Inventory Average Pain 8 Pain Right Now 8 My pain is sharp, burning and stabbing  In the last 24 hours, has pain interfered with the following? General activity 3 Relation with others 3 Enjoyment of life 3 What TIME of day is your pain at its worst? morning evening Sleep (in general) Poor  Pain is worse with: walking, bending, sitting and standing Pain improves with: rest, heat/ice, therapy/exercise and TENS Relief from Meds: na  Mobility walk without assistance how many minutes can you walk? 20 ability to climb steps?  no do you drive?  yes Do you have any goals in this area?  no  Function retired  Neuro/Psych bladder control problems bowel control problems dizziness depression anxiety  Prior Studies Any changes since last visit?  yes x-rays CT/MRI  Physicians involved in your care Any changes since last visit?  yes Primary care Dr. Byrd Hesselbach Orthopedist Dr. Maxie Better   Family History  Problem Relation Age of Onset  . Cancer Mother 81       breast  . Alzheimer's disease Mother   . Cancer Brother   . Hyperlipidemia Brother   . Prostate cancer Father   . Lung cancer  Father   . Cancer Brother   . Hyperlipidemia Brother   . Dementia Brother        frontotemporal dementia   Social History   Socioeconomic History  . Marital status: Married    Spouse name: [name]  . Number of children: 1  . Years of education: 12+  . Highest education level: Not on file  Occupational History  . Occupation: ACTIVITY DIRECTOR    Employer: [employer]  . Occupation: CNA  Social Needs  . Financial resource strain: Not on file  . Food insecurity:    Worry: Not on file    Inability: Not on file  . Transportation needs:    Medical: Not on file    Non-medical: Not on file  Tobacco Use  . Smoking status: Former Smoker    Packs/day: 0.30    Types: Cigarettes    Last attempt to quit: 11/01/2011    Years since quitting: 6.5  . Smokeless tobacco: Never Used  Substance and Sexual Activity  . Alcohol use: No    Alcohol/week: 0.0 standard drinks  . Drug use: No  . Sexual activity: Yes    Partners: Male    Birth control/protection: Surgical    Comment: 1 sexual partner in last 12 months: married  Lifestyle  . Physical activity:    Days per week: Not on file    Minutes per session: Not on file  .  Stress: Not on file  Relationships  . Social connections:    Talks on phone: Not on file    Gets together: Not on file    Attends religious service: Not on file    Active member of club or organization: Not on file    Attends meetings of clubs or organizations: Not on file    Relationship status: Not on file  Other Topics Concern  . Not on file  Social History Narrative   Patient is married ([name]).   Patient has one child.   Patient is retired   Patient is right handed.   Patient has a college education.   Patient drinks decaf coffee    Past Surgical History:  Procedure Laterality Date  . ABDOMINAL HYSTERECTOMY    . BILIARY STENT PLACEMENT N/A 12/12/2015   Procedure: BILIARY STENT PLACEMENT;  Surgeon: Doran Stabler, MD;  Location: [facility];   Service: Endoscopy;  Laterality: N/A;  . BLADDER REPAIR    . CARPAL TUNNEL RELEASE    . CERVICAL SPINE SURGERY    . CHOLECYSTECTOMY N/A 12/06/2015   Procedure: LAPAROSCOPIC CHOLECYSTECTOMY WITH  INTRAOPERATIVE CHOLANGIOGRAM;  Surgeon: Stark Klein, MD;  Location: [facility];  Service: General;  Laterality: N/A;  . ELBOW SURGERY    . ERCP N/A 12/12/2015   Procedure: ENDOSCOPIC RETROGRADE CHOLANGIOPANCREATOGRAPHY (ERCP);  Surgeon: Doran Stabler, MD;  Location: [facility] END[facility]COPY;  Service: Endoscopy;  Laterality: N/A;  . ESOPHAGOGASTRODUODENOSCOPY N/A 01/28/2016   Procedure: ESOPHAGOGASTRODUODENOSCOPY (EGD) Biliary STENT removal;  Surgeon: Doran Stabler, MD;  Location: [facility];  Service: Gastroenterology;  Laterality: N/A;  . IR GENERIC HISTORICAL  12/17/2015   IR US GUIDE BX ASP/DRAIN 12/17/2015 [facility]  . IR GENERIC HISTORICAL  12/17/2015   IR GUIDED DRAIN W CATHETER PLACEMENT 12/17/2015 [facility]  . IR GENERIC HISTORICAL  12/17/2015   IR SINUS/FIST TUBE CHK-NON GI 12/17/2015 [facility]  . KNEE SURGERY    . LUNG SURGERY    . TEE WITHOUT CARDIOVERSION N/A 12/19/2015   Procedure: TRANSESOPHAGEAL ECHOCARDIOGRAM (TEE);  Surgeon: Josue Hector, MD;  Location[facility] ENDOSCOPY;  Service: Cardiovascular;  Laterality: N/A;   Past Medical History:  Diagnosis Date  . Chronic kidney disease    stage 3  . GERD (gastroesophageal reflux disease)   . Hyperlipidemia   . Migraine headache   . Occipital neuralgia    Left  . Renal insufficiency   . Stroke ([place]) 10/2013   BP 96/66   Pulse 64   Ht 5\' 7"  (1.702 m)   Wt 174 lb 6.4 oz (79.1 kg)   SpO2 97%   BMI 27.31 kg/m   Opioid Risk Score:   Fall Risk Score:  `1  Depression screen PHQ 2/9  Depression screen [facility] 2/9 06/04/2018 05/18/2018 10/26/2017 09/23/2017 01/02/2017 11/04/2016 01/25/2016  Decreased Interest 0 0 0 0 0 0 0  Down, Depressed, Hopeless 0 0 0 0 0 0 1  PHQ - 2 Score 0 0 0 0 0 0 1  Altered sleeping - - - - - - -  Tired,  decreased energy - - - - - - -  Change in appetite - - - - - - -  Feeling bad or failure about yourself  - - - - - - -  Trouble concentrating - - - - - - -  Moving slowly or fidgety/restless - - - - - - -  Suicidal thoughts - - - - - - -  PHQ-9  Score - - - - - - -  Difficult doing work/chores - - - - - - -  Some recent data might be hidden    Review of Systems  Constitutional: Positive for diaphoresis.  HENT: Negative.   Eyes: Negative.   Respiratory: Negative.   Cardiovascular: Negative.   Gastrointestinal: Negative.   Endocrine: Negative.   Genitourinary: Negative.   Musculoskeletal: Negative.   Skin: Negative.  Negative for color change.  Allergic/Immunologic: Negative.   Neurological: Negative.   Hematological: Bruises/bleeds easily.  Psychiatric/Behavioral: Negative.   All other systems reviewed and are negative.      Objective:   Physical Exam Vitals signs and nursing note reviewed.  Constitutional:      Appearance: Normal appearance.  HENT:     Head: Normocephalic and atraumatic.     Nose: Nose normal. No congestion or rhinorrhea.     Mouth/Throat:     Mouth: Mucous membranes are moist.     Pharynx: Oropharynx is clear.  Eyes:     Extraocular Movements: Extraocular movements intact.     Conjunctiva/sclera: Conjunctivae normal.     Pupils: Pupils are equal, round, and reactive to light.  Neurological:     Mental Status: She is alert.  Psychiatric:        Mood and Affect: Mood normal.   Tenderness palpation around the posterior axillary line radiating to the mid or anterior axillary line mainly at the T9 and T10 levels on the left side. Lungs are clear Heart regular rate and rhythm no murmurs No rib pain with movement of the upper extremities. She has normal upper extremity strength and lower extremity strength Ambulates without assistive device no evidence of toe drag or knee instability        Assessment & Plan:  1.  Intercostal neuralgia, chronic  from a fall several years ago while she was still working.  Her workers comp case is closed she is now on her regular [employer].  We can continue seeing her on a yearly basis, Lidoderm patch 2 patches on 12 off 12 to the left lateral rib area 2.  History of fall with pain in the lumbar spine she will follow-up with orthopedics on this

## 2018-06-11 ENCOUNTER — Other Ambulatory Visit: Payer: Self-pay | Admitting: Cardiovascular Disease

## 2018-06-29 ENCOUNTER — Other Ambulatory Visit: Payer: Self-pay | Admitting: Adult Health

## 2018-06-29 ENCOUNTER — Other Ambulatory Visit: Payer: Self-pay | Admitting: Family Medicine

## 2018-06-29 NOTE — Telephone Encounter (Signed)
Spoke to pt and she is taking tizanidine as ordered.  She needs refill on this but the zonegran 25mg  caps bid prn only,  along with 100mg  caps bid.  Has appt 08-30-18 with MM/NP.

## 2018-06-29 NOTE — Telephone Encounter (Signed)
  Pt has appt 08-30-18 with MM/ NP.  LMVM for pt to return call about refill request on zonegran and tizanidine.

## 2018-07-01 ENCOUNTER — Other Ambulatory Visit: Payer: Self-pay | Admitting: Adult Health

## 2018-07-06 DIAGNOSIS — N183 Chronic kidney disease, stage 3 (moderate): Secondary | ICD-10-CM | POA: Diagnosis not present

## 2018-07-06 DIAGNOSIS — N189 Chronic kidney disease, unspecified: Secondary | ICD-10-CM | POA: Diagnosis not present

## 2018-07-08 ENCOUNTER — Telehealth: Payer: Self-pay | Admitting: Pharmacist

## 2018-07-08 DIAGNOSIS — E785 Hyperlipidemia, unspecified: Secondary | ICD-10-CM

## 2018-07-08 NOTE — Telephone Encounter (Signed)
Patient using Repatha without problems. Due to repeat fasting blood work.  Order for blood draw mailed to patient.

## 2018-07-08 NOTE — Addendum Note (Signed)
Addended by: Harrington Challenger on: 07/08/2018 10:54 AM   Modules accepted: Orders

## 2018-07-09 DIAGNOSIS — G459 Transient cerebral ischemic attack, unspecified: Secondary | ICD-10-CM | POA: Diagnosis not present

## 2018-07-09 DIAGNOSIS — N183 Chronic kidney disease, stage 3 (moderate): Secondary | ICD-10-CM | POA: Diagnosis not present

## 2018-07-09 DIAGNOSIS — N390 Urinary tract infection, site not specified: Secondary | ICD-10-CM | POA: Diagnosis not present

## 2018-07-09 DIAGNOSIS — I129 Hypertensive chronic kidney disease with stage 1 through stage 4 chronic kidney disease, or unspecified chronic kidney disease: Secondary | ICD-10-CM | POA: Diagnosis not present

## 2018-07-09 DIAGNOSIS — D631 Anemia in chronic kidney disease: Secondary | ICD-10-CM | POA: Diagnosis not present

## 2018-07-09 DIAGNOSIS — N2581 Secondary hyperparathyroidism of renal origin: Secondary | ICD-10-CM | POA: Diagnosis not present

## 2018-08-16 DIAGNOSIS — E785 Hyperlipidemia, unspecified: Secondary | ICD-10-CM | POA: Diagnosis not present

## 2018-08-16 LAB — HEPATIC FUNCTION PANEL
ALT: 14 IU/L (ref 0–32)
AST: 17 IU/L (ref 0–40)
Albumin: 4.2 g/dL (ref 3.8–4.8)
Alkaline Phosphatase: 110 IU/L (ref 39–117)
Bilirubin Total: 0.4 mg/dL (ref 0.0–1.2)
Bilirubin, Direct: 0.11 mg/dL (ref 0.00–0.40)
Total Protein: 6.1 g/dL (ref 6.0–8.5)

## 2018-08-16 LAB — LIPID PANEL
Chol/HDL Ratio: 5.5 ratio — ABNORMAL HIGH (ref 0.0–4.4)
Cholesterol, Total: 244 mg/dL — ABNORMAL HIGH (ref 100–199)
HDL: 44 mg/dL (ref >39)
LDL Calculated: 174 mg/dL — ABNORMAL HIGH (ref 0–99)
Triglycerides: 129 mg/dL (ref 0–149)
VLDL Cholesterol Cal: 26 mg/dL (ref 5–40)

## 2018-08-17 ENCOUNTER — Telehealth: Payer: Self-pay | Admitting: Pharmacist

## 2018-08-17 DIAGNOSIS — E785 Hyperlipidemia, unspecified: Secondary | ICD-10-CM

## 2018-08-17 NOTE — Telephone Encounter (Signed)
LDL elevated since stating Repatha??  LMOM; patient to call back to discuss compliance with Repatha and alternative therapy.

## 2018-08-17 NOTE — Telephone Encounter (Signed)
Spoke with patient, states compliance with Repatha - using once monthly.  Double checked in our system and with pharmacy, patient was prescribed Sureclick, with O17 day dosing.  In a call to the pharmacy, she picked up the prescription once, on Feb 19.    Returned call to patient and [facility] - asking her to pick up prescription and use EVERY OTHER WEEK.  After 2 doses she will need to come into the office for labs to check lipid panel.

## 2018-08-18 ENCOUNTER — Ambulatory Visit (INDEPENDENT_AMBULATORY_CARE_PROVIDER_SITE_OTHER): Payer: Medicare Other | Admitting: Family Medicine

## 2018-08-18 ENCOUNTER — Other Ambulatory Visit: Payer: Self-pay

## 2018-08-18 ENCOUNTER — Encounter: Payer: Self-pay | Admitting: Family Medicine

## 2018-08-18 VITALS — BP 100/65 | HR 84 | Temp 97.7°F

## 2018-08-18 DIAGNOSIS — R109 Unspecified abdominal pain: Secondary | ICD-10-CM | POA: Diagnosis not present

## 2018-08-18 DIAGNOSIS — N390 Urinary tract infection, site not specified: Secondary | ICD-10-CM | POA: Diagnosis not present

## 2018-08-18 DIAGNOSIS — I2510 Atherosclerotic heart disease of native coronary artery without angina pectoris: Secondary | ICD-10-CM | POA: Diagnosis not present

## 2018-08-18 DIAGNOSIS — R8290 Unspecified abnormal findings in urine: Secondary | ICD-10-CM | POA: Diagnosis not present

## 2018-08-18 DIAGNOSIS — I2584 Coronary atherosclerosis due to calcified coronary lesion: Secondary | ICD-10-CM

## 2018-08-18 LAB — POCT URINALYSIS DIP (MANUAL ENTRY)
Bilirubin, UA: NEGATIVE
Glucose, UA: NEGATIVE mg/dL
Ketones, POC UA: NEGATIVE mg/dL
Nitrite, UA: NEGATIVE
Spec Grav, UA: 1.015 (ref 1.010–1.025)
Urobilinogen, UA: 0.2 E.U./dL
pH, UA: 7 (ref 5.0–8.0)

## 2018-08-18 LAB — POCT UA - MICROSCOPIC ONLY

## 2018-08-18 MED ORDER — CEPHALEXIN 500 MG PO CAPS: 500.0000 mg | ORAL_CAPSULE | Freq: Four times a day (QID) | ORAL | 0 refills | Status: AC

## 2018-08-18 NOTE — Patient Instructions (Signed)
Good to see you today!  Thanks for coming in.  Take the antibiotics (cephalexin) 1 capsule 4 x a day  If you develop high fever or nausea and vomiting or are feeling a lot worse then call us  We will let you know about the urine culture by Friday  Drink plenty of fluids

## 2018-08-18 NOTE — Progress Notes (Signed)
Subjective  [name] is a 68 y.o. female is presenting with the following  BACK PAIN  Right sided Back pain began yesterday.  Very similar to back pain she had with a UTI hospitalized in January Has noticed smelly urine for last week  Pain is described as achy. 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   Chief Complaint noted Review of Symptoms - see HPI PMH - Smoking status noted.    Objective Vital Signs reviewed BP 100/65   Pulse 84   Temp 97.7 F (36.5 C) (Axillary)   SpO2 96%  Alert nad, interactive  Mobility:able to get up and down from exam table without assistance or distress Moderate R CVAT Mild RLQ tenderness without guarding or rebound  UA noted   Assessments/Plans  See after visit summary for details of patient instuctions  Complicated UTI (urinary tract infection) Given history and CVAT the could be early pyleo.  No systemic signs or persistent vomiting therefore will start oral keflex (last UTI was sensitive)  Get culture.  Warning signs reviewed

## 2018-08-18 NOTE — Assessment & Plan Note (Signed)
Given history and CVAT the could be early pyleo.  No systemic signs or persistent vomiting therefore will start oral keflex (last UTI was sensitive)  Get culture.  Warning signs reviewed

## 2018-08-23 ENCOUNTER — Telehealth: Payer: Self-pay | Admitting: Family Medicine

## 2018-08-23 NOTE — Telephone Encounter (Signed)
Left Message to call us

## 2018-08-24 ENCOUNTER — Telehealth: Payer: Self-pay | Admitting: Family Medicine

## 2018-08-24 NOTE — Telephone Encounter (Signed)
Pt called requesting a work note for her visit here on 08/18/2018, pt stated she was out of work until 08/23/2018. Best phone # to contact for questions is [phone].

## 2018-08-24 NOTE — Telephone Encounter (Signed)
She called back yesterday Feeling much better less pain and frequency No fever or nausea and vomiting  She will finish all thea antibiotics

## 2018-08-26 ENCOUNTER — Telehealth: Payer: Self-pay

## 2018-08-26 NOTE — Telephone Encounter (Signed)
Unable to get in contact with the patient to convert their office appt with Megan on 08/30/2018 into a mychart video visit. I left a voicemail asking the patient to return my call. Office number was provided.    If patient calls back please r/s and convert their office visit into a mychart video visit.

## 2018-08-27 ENCOUNTER — Other Ambulatory Visit: Payer: Self-pay | Admitting: Family Medicine

## 2018-08-27 NOTE — Progress Notes (Signed)
Opened in error

## 2018-08-27 NOTE — Telephone Encounter (Signed)
I have written her a work note and am leaving it at the front desk for her to pick up.  Please inform patient, thanks

## 2018-08-30 ENCOUNTER — Ambulatory Visit: Payer: Medicare Other | Admitting: Adult Health

## 2018-08-30 NOTE — Telephone Encounter (Signed)
LMOVM telling pt that letter is upfront. Deseree Kennon Holter, CMA

## 2018-09-09 DIAGNOSIS — M170 Bilateral primary osteoarthritis of knee: Secondary | ICD-10-CM | POA: Diagnosis not present

## 2018-09-17 ENCOUNTER — Other Ambulatory Visit: Payer: Self-pay

## 2018-09-17 ENCOUNTER — Ambulatory Visit (INDEPENDENT_AMBULATORY_CARE_PROVIDER_SITE_OTHER): Payer: Medicare Other

## 2018-09-17 VITALS — BP 110/75 | HR 71 | Temp 98.3°F | Ht 67.0 in | Wt 170.0 lb

## 2018-09-17 DIAGNOSIS — Z0000 Encounter for general adult medical examination without abnormal findings: Secondary | ICD-10-CM | POA: Diagnosis not present

## 2018-09-17 NOTE — Patient Instructions (Signed)
You spoke to [name], [name] for your annual wellness visit.  We discussed goals: Goals    . Weight (lb) < 160 lb (72.6 kg)      We also discussed recommended health maintenance. Please call our office and schedule a visit. As discussed, make apts for your mammogram and your colonoscopy. When you see [name] for your GYN apt, consider asking for a referral for a dexa scan. Make an apt with our office in the fall to meet your new PCP, [name], and to get your annual flu vaccine.   Health Maintenance  Topic Date Due  . COLONOSCOPY  10/31/2001  . MAMMOGRAM  07/01/2015  . DEXA SCAN  10/31/2016  . PNA vac Low Risk Adult (2 of 2 - PPSV23) 07/31/2018  . INFLUENZA VACCINE  10/09/2018  . TETANUS/TDAP  01/24/2026  . Hepatitis C Screening  Completed    We also discussed your goal to lose weight <160. Please read over the attached materials I have provided for you.   Take care!   Here is an example of what a healthy plate looks like:    ? Make half your plate fruits and vegetables.     ? Focus on whole fruits.     ? Vary your veggies.  ? Make half your grains whole grains. -     ? Look for the word "whole" at the beginning of the ingredients list    ? Some whole-grain ingredients include whole oats, whole-wheat flour,        whole-grain corn, whole-grain brown rice, and whole rye.  ? Move to low-fat and fat-free milk or yogurt.  ? Vary your protein routine. - Meat, fish, poultry (chicken, [name]), eggs, beans (kidney, pinto), dairy.  ? Drink and eat less sodium, saturated fat, and added sugars.   Preventive Care 11 Years and Older, Female Preventive care refers to lifestyle choices and visits with your health care provider that can promote health and wellness. This includes:  A yearly physical exam. This is also called an annual well check.  Regular dental and eye exams.  Immunizations.  Screening for certain conditions.  Healthy lifestyle choices, such as diet  and exercise. What can I expect for my preventive care visit? Physical exam Your health care provider will check:  Height and weight. These may be used to calculate body mass index (BMI), which is a measurement that tells if you are at a healthy weight.  Heart rate and blood pressure.  Your skin for abnormal spots. Counseling Your health care provider may ask you questions about:  Alcohol, tobacco, and drug use.  Emotional well-being.  Home and relationship well-being.  Sexual activity.  Eating habits.  History of falls.  Memory and ability to understand (cognition).  Work and work Statistician.  Pregnancy and menstrual history. What immunizations do I need?  Influenza (flu) vaccine  This is recommended every year. Tetanus, diphtheria, and pertussis (Tdap) vaccine  You may need a Td booster every 10 years. Varicella (chickenpox) vaccine  You may need this vaccine if you have not already been vaccinated. Zoster (shingles) vaccine  You may need this after age 72. Pneumococcal conjugate (PCV13) vaccine  One dose is recommended after age 53. Pneumococcal polysaccharide (PPSV23) vaccine  One dose is recommended after age 40. Measles, mumps, and rubella (MMR) vaccine  You may need at least one dose of MMR if you were born in 1957 or later. You may also need a second dose. Meningococcal  conjugate (MenACWY) vaccine  You may need this if you have certain conditions. Hepatitis A vaccine  You may need this if you have certain conditions or if you travel or work in places where you may be exposed to hepatitis A. Hepatitis B vaccine  You may need this if you have certain conditions or if you travel or work in places where you may be exposed to hepatitis B. Haemophilus influenzae type b (Hib) vaccine  You may need this if you have certain conditions. You may receive vaccines as individual doses or as more than one vaccine together in one shot (combination vaccines).  Talk with your health care provider about the risks and benefits of combination vaccines. What tests do I need? Blood tests  Lipid and cholesterol levels. These may be checked every 5 years, or more frequently depending on your overall health.  Hepatitis C test.  Hepatitis B test. Screening  Lung cancer screening. You may have this screening every year starting at age 72 if you have a 30-pack-year history of smoking and currently smoke or have quit within the past 15 years.  Colorectal cancer screening. All adults should have this screening starting at age 6 and continuing until age 73. Your health care provider may recommend screening at age 67 if you are at increased risk. You will have tests every 1-10 years, depending on your results and the type of screening test.  Diabetes screening. This is done by checking your blood sugar (glucose) after you have not eaten for a while (fasting). You may have this done every 1-3 years.  Mammogram. This may be done every 1-2 years. Talk with your health care provider about how often you should have regular mammograms.  BRCA-related cancer screening. This may be done if you have a family history of breast, ovarian, tubal, or peritoneal cancers. Other tests  Sexually transmitted disease (STD) testing.  Bone density scan. This is done to screen for osteoporosis. You may have this done starting at age 73. Follow these instructions at home: Eating and drinking  Eat a diet that includes fresh fruits and vegetables, whole grains, lean protein, and low-fat dairy products. Limit your intake of foods with high amounts of sugar, saturated fats, and salt.  Take vitamin and mineral supplements as recommended by your health care provider.  Do not drink alcohol if your health care provider tells you not to drink.  If you drink alcohol: ? Limit how much you have to 0-1 drink a day. ? Be aware of how much alcohol is in your drink. In the [place] one drink  equals one 12 oz bottle of beer (355 mL), one 5 oz glass of wine (148 mL), or one 1 oz glass of hard liquor (44 mL). Lifestyle  Take daily care of your teeth and gums.  Stay active. Exercise for at least 30 minutes on 5 or more days each week.  Do not use any products that contain nicotine or tobacco, such as cigarettes, e-cigarettes, and chewing tobacco. If you need help quitting, ask your health care provider.  If you are sexually active, practice safe sex. Use a condom or other form of protection in order to prevent STIs (sexually transmitted infections).  Talk with your health care provider about taking a low-dose aspirin or statin. What's next?  Go to your health care provider once a year for a well check visit.  Ask your health care provider how often you should have your eyes and teeth checked.  Stay  up to date on all vaccines. This information is not intended to replace advice given to you by your health care provider. Make sure you discuss any questions you have with your health care provider. Document Released: 03/23/2015 Document Revised: 02/18/2018 Document Reviewed: 02/18/2018 Elsevier Patient Education  2020 [place] [facility] [employer] Calories are units of energy. Your body needs a certain amount of calories from food to keep you going throughout the day. When you eat more calories than your body needs, your body stores the extra calories as fat. When you eat fewer calories than your body needs, your body burns fat to get the energy it needs. Calorie counting means keeping track of how many calories you eat and drink each day. Calorie counting can be helpful if you need to lose weight. If you make sure to eat fewer calories than your body needs, you should lose weight. Ask your health care provider what a healthy weight is for you. For calorie counting to work, you will need to eat the right number of calories in a day in order to lose a healthy amount  of weight per week. A dietitian can help you determine how many calories you need in a day and will give you suggestions on how to reach your calorie goal.  A healthy amount of weight to lose per week is usually 1-2 lb (0.5-0.9 kg). This usually means that your daily calorie intake should be reduced by 500-750 calories.  Eating 1,200 - 1,500 calories per day can help most women lose weight.  Eating 1,500 - 1,800 calories per day can help most men lose weight. What do I need to know about calorie counting? In order to meet your daily calorie goal, you will need to:  Find out how many calories are in each food you would like to eat. Try to do this before you eat.  Decide how much of the food you plan to eat.  Write down what you ate and how many calories it had. Doing this is called keeping a food log. To successfully lose weight, it is important to balance calorie counting with a healthy lifestyle that includes regular activity. Aim for 150 minutes of moderate exercise (such as walking) or 75 minutes of vigorous exercise (such as running) each week. Where do I find calorie information?  The number of calories in a food can be found on a Nutrition Facts label. If a food does not have a Nutrition Facts label, try to look up the calories online or ask your dietitian for help. Remember that calories are listed per serving. If you choose to have more than one serving of a food, you will have to multiply the calories per serving by the amount of servings you plan to eat. For example, the label on a package of bread might say that a serving size is 1 slice and that there are 90 calories in a serving. If you eat 1 slice, you will have eaten 90 calories. If you eat 2 slices, you will have eaten 180 calories. How do I keep a food log? Immediately after each meal, record the following information in your food log:  What you ate. Don't forget to include toppings, sauces, and other extras on the food.   How much you ate. This can be measured in cups, ounces, or number of items.  How many calories each food and drink had.  The total number of calories in the meal. Keep  your food log near you, such as in a small notebook in your pocket, or use a mobile app or website. Some programs will calculate calories for you and show you how many calories you have left for the day to meet your goal. What are some calorie counting tips?   Use your calories on foods and drinks that will fill you up and not leave you hungry: ? Some examples of foods that fill you up are nuts and nut butters, vegetables, lean proteins, and high-fiber foods like whole grains. High-fiber foods are foods with more than 5 g fiber per serving. ? Drinks such as sodas, specialty coffee drinks, alcohol, and juices have a lot of calories, yet do not fill you up.  Eat nutritious foods and avoid empty calories. Empty calories are calories you get from foods or beverages that do not have many vitamins or protein, such as candy, sweets, and soda. It is better to have a nutritious high-calorie food (such as an avocado) than a food with few nutrients (such as a bag of chips).  Know how many calories are in the foods you eat most often. This will help you calculate calorie counts faster.  Pay attention to calories in drinks. Low-calorie drinks include water and unsweetened drinks.  Pay attention to nutrition labels for "low fat" or "fat free" foods. These foods sometimes have the same amount of calories or more calories than the full fat versions. They also often have added sugar, starch, or salt, to make up for flavor that was removed with the fat.  Find a way of tracking calories that works for you. Get creative. Try different apps or programs if writing down calories does not work for you. What are some portion control tips?  Know how many calories are in a serving. This will help you know how many servings of a certain food you can have.   Use a measuring cup to measure serving sizes. You could also try weighing out portions on a kitchen scale. With time, you will be able to estimate serving sizes for some foods.  Take some time to put servings of different foods on your favorite plates, bowls, and cups so you know what a serving looks like.  Try not to eat straight from a bag or box. Doing this can lead to overeating. Put the amount you would like to eat in a cup or on a plate to make sure you are eating the right portion.  Use smaller plates, glasses, and bowls to prevent overeating.  Try not to multitask (for example, watch TV or use your computer) while eating. If it is time to eat, sit down at a table and enjoy your food. This will help you to know when you are full. It will also help you to be aware of what you are eating and how much you are eating. What are tips for following this plan? Reading food labels  Check the calorie count compared to the serving size. The serving size may be smaller than what you are used to eating.  Check the source of the calories. Make sure the food you are eating is high in vitamins and protein and low in saturated and trans fats. Shopping  Read nutrition labels while you shop. This will help you make healthy decisions before you decide to purchase your food.  Make a grocery list and stick to it. Cooking  Try to cook your favorite foods in a healthier way. For example, try  baking instead of frying.  Use low-fat dairy products. Meal planning  Use more fruits and vegetables. Half of your plate should be fruits and vegetables.  Include lean proteins like poultry and fish. How do I count calories when eating out?  Ask for smaller portion sizes.  Consider sharing an entree and sides instead of getting your own entree.  If you get your own entree, eat only half. Ask for a box at the beginning of your meal and put the rest of your entree in it so you are not tempted to eat it.  If  calories are listed on the menu, choose the lower calorie options.  Choose dishes that include vegetables, fruits, whole grains, low-fat dairy products, and lean protein.  Choose items that are boiled, broiled, grilled, or steamed. Stay away from items that are buttered, battered, fried, or served with cream sauce. Items labeled "crispy" are usually fried, unless stated otherwise.  Choose water, low-fat milk, unsweetened iced tea, or other drinks without added sugar. If you want an alcoholic beverage, choose a lower calorie option such as a glass of wine or light beer.  Ask for dressings, sauces, and syrups on the side. These are usually high in calories, so you should limit the amount you eat.  If you want a salad, choose a garden salad and ask for grilled meats. Avoid extra toppings like bacon, cheese, or fried items. Ask for the dressing on the side, or ask for olive oil and vinegar or lemon to use as dressing.  Estimate how many servings of a food you are given. For example, a serving of cooked rice is  cup or about the size of half a baseball. Knowing serving sizes will help you be aware of how much food you are eating at restaurants. The list below tells you how big or small some common portion sizes are based on everyday objects: ? 1 oz-4 stacked dice. ? 3 oz-1 deck of cards. ? 1 tsp-1 die. ? 1 Tbsp- a ping-pong ball. ? 2 Tbsp-1 ping-pong ball. ?  cup- baseball. ? 1 cup-1 baseball. Summary  Calorie counting means keeping track of how many calories you eat and drink each day. If you eat fewer calories than your body needs, you should lose weight.  A healthy amount of weight to lose per week is usually 1-2 lb (0.5-0.9 kg). This usually means reducing your daily calorie intake by 500-750 calories.  The number of calories in a food can be found on a Nutrition Facts label. If a food does not have a Nutrition Facts label, try to look up the calories online or ask your dietitian for  help.  Use your calories on foods and drinks that will fill you up, and not on foods and drinks that will leave you hungry.  Use smaller plates, glasses, and bowls to prevent overeating. This information is not intended to replace advice given to you by your health care provider. Make sure you discuss any questions you have with your health care provider. Document Released: 02/24/2005 Document Revised: 11/13/2017 Document Reviewed: 01/25/2016 Elsevier Patient Education  2020 [place] [facility]'s number is [phone]. Please call with questions or concerns about what we discussed today.

## 2018-09-17 NOTE — Progress Notes (Addendum)
Subjective:   [name] is a 68 y.o. female who presents for Initial Medicare Annual preventive examination.  The patient consented to a virtual visit.  Review of Systems: Defer to PCP   Cardiac Risk Factors include: advanced age (>12men, >64 women)     Objective:     Vitals: BP 110/75   Pulse 71   Temp 98.3 F (36.8 C) (Oral)   Ht 5\' 7"  (1.702 m)   Wt 170 lb (77.1 kg)   SpO2 98%   BMI 26.63 kg/m   Body mass index is 26.63 kg/m.  Advanced Directives 09/17/2018 05/18/2018 04/03/2018 10/26/2017 09/23/2017 07/30/2017 07/26/2017  Does Patient Have a Medical Advance Directive? Yes No Yes Yes Yes Yes Yes  Type of Paramedic of [place];Living will - Living will;Healthcare Power of Attorney Living will Living will Living will Living will  Does patient want to make changes to medical advance directive? No - Patient declined - No - Patient declined - - - No - Patient declined  Copy of [place] in Chart? No - copy requested - - - - - -  Would patient like information on creating a medical advance directive? - No - Patient declined - - - - -    Tobacco Social History   Tobacco Use  Smoking Status Former Smoker  . Packs/day: 0.30  . Types: Cigarettes  . Quit date: 11/01/2011  . Years since quitting: 6.8  Smokeless Tobacco Never Used     Clinical Intake:  Pre-visit preparation completed: Yes  Pain Score: 0-No pain  How often do you need to have someone help you when you read instructions, pamphlets, or other written materials from your doctor or pharmacy?: 1 - Never What is the last grade level you completed in school?: High School  Interpreter Needed?: No   Past Medical History:  Diagnosis Date  . Chronic kidney disease    stage 3  . GERD (gastroesophageal reflux disease)   . Hyperlipidemia   . Migraine headache   . Occipital neuralgia    Left  . Renal insufficiency   . Stroke [facility]) 10/2013   Past Surgical History:   Procedure Laterality Date  . ABDOMINAL HYSTERECTOMY    . BILIARY STENT PLACEMENT N/A 12/12/2015   Procedure: BILIARY STENT PLACEMENT;  Surgeon: Doran Stabler, MD;  Location: [facility];  Service: Endoscopy;  Laterality: N/A;  . BLADDER REPAIR    . CARPAL TUNNEL RELEASE    . CERVICAL SPINE SURGERY    . CHOLECYSTECTOMY N/A 12/06/2015   Procedure: LAPAROSCOPIC CHOLECYSTECTOMY WITH  INTRAOPERATIVE CHOLANGIOGRAM;  Surgeon: Stark Klein, MD;  Location: [facility];  Service: General;  Laterality: N/A;  . ELBOW SURGERY    . ERCP N/A 12/12/2015   Procedure: ENDOSCOPIC RETROGRADE CHOLANGIOPANCREATOGRAPHY (ERCP);  Surgeon: Doran Stabler, MD;  Location: [facility];  Service: Endoscopy;  Laterality: N/A;  . ESOPHAGOGASTRODUODENOSCOPY N/A 01/28/2016   Procedure: ESOPHAGOGASTRODUODENOSCOPY (EGD) Biliary STENT removal;  Surgeon: Doran Stabler, MD;  Location: [facility];  Service: Gastroenterology;  Laterality: N/A;  . IR GENERIC HISTORICAL  12/17/2015   IR US GUIDE BX ASP/DRAIN 12/17/2015 [facility]  . IR GENERIC HISTORICAL  12/17/2015   IR GUIDED DRAIN W CATHETER PLACEMENT 12/17/2015 [facility]  . IR GENERIC HISTORICAL  12/17/2015   IR SINUS/FIST TUBE CHK-NON GI 12/17/2015 [facility]  . KNEE SURGERY    . LUNG SURGERY    . TEE WITHOUT CARDIOVERSION N/A 12/19/2015  Procedure: TRANSESOPHAGEAL ECHOCARDIOGRAM (TEE);  Surgeon: Josue Hector, MD;  Location: [facility] END[facility]COPY;  Service: Cardiovascular;  Laterality: N/A;   Family History  Problem Relation Age of Onset  . Cancer Mother 2       breast  . Alzheimer's disease Mother   . Cancer Brother   . Hyperlipidemia Brother   . Prostate cancer Father   . Lung cancer Father   . Dementia Maternal Grandmother   . Cancer Maternal Grandfather   . Cancer Brother   . Hyperlipidemia Brother   . Dementia Brother        frontotemporal dementia   Social History   Socioeconomic History  . Marital status: Married    Spouse name: [name]  . Number  of children: 1  . Years of education: 12+  . Highest education level: High school graduate  Occupational History  . Occupation: ACTIVITY DIRECTOR    Employer: [employer]  . Occupation: CNA  Social Needs  . Financial resource strain: Not hard at all  . Food insecurity    Worry: Never true    Inability: Never true  . Transportation needs    Medical: No    Non-medical: No  Tobacco Use  . Smoking status: Former Smoker    Packs/day: 0.30    Types: Cigarettes    Quit date: 11/01/2011    Years since quitting: 6.8  . Smokeless tobacco: Never Used  Substance and Sexual Activity  . Alcohol use: No    Alcohol/week: 0.0 standard drinks  . Drug use: No  . Sexual activity: Yes    Partners: Male    Birth control/protection: Surgical    Comment: 1 sexual partner in last 12 months: married  Lifestyle  . Physical activity    Days per week: 7 days    Minutes per session: 20 min  . Stress: Only a little  Relationships  . Social Herbalist on phone: Twice a week    Gets together: More than three times a week    Attends religious service: 1 to 4 times per year    Active member of club or organization: No    Attends meetings of clubs or organizations: Never    Relationship status: Married  Other Topics Concern  . Not on file  Social History Narrative   Patient is married ([name]).   Patient has one child and grandchild. Her sons name is [name]. Patient sees them often.   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 Patient spends 6-10 hours with her 6 days a week.    Patient has a college education.   Patient is very active in her church.    Hobbies- outside crafts and church choir.    Outpatient Encounter Medications as of 09/17/2018  Medication Sig  . clopidogrel (PLAVIX) 75 MG tablet TAKE 1 TABLET BY MOUTH DAILY (Patient taking differently: Take 75 mg by mouth daily. )  . doxepin (SINEQUAN) 100 MG capsule Take 2 capsules  (200 mg total) by mouth at bedtime.  Marland Kitchen esomeprazole (NEXIUM) 20 MG capsule TAKE 1 CAPSULE(20 MG) BY MOUTH DAILY  . Evolocumab (REPATHA SURECLICK) 151 MG/ML SOAJ Inject 140 mg into the skin every 14 (fourteen) days.  Marland Kitchen ezetimibe (ZETIA) 10 MG tablet Take 10 mg by mouth daily.   Marland Kitchen gabapentin (NEURONTIN) 600 MG tablet TAKE 1 TABLET BY MOUTH TWICE DAILY  . lidocaine (LIDODERM) 5 % Place 2 patches onto the  skin daily. Apply to 2 patches to affected area in the AM, then remove after 12 hours  . ondansetron (ZOFRAN) 4 MG tablet Take 1 tablet (4 mg total) by mouth every 8 (eight) hours as needed for nausea or vomiting.  . propranolol (INDERAL) 10 MG tablet Take 1 tablet (10 mg total) by mouth 2 (two) times daily.  Marland Kitchen tiZANidine (ZANAFLEX) 2 MG tablet TAKE 1 TABLET(2 MG) BY MOUTH THREE TIMES DAILY  . zonisamide (ZONEGRAN) 100 MG capsule TAKE 1 CAPSULE(100 MG) BY MOUTH TWICE DAILY   No facility-administered encounter medications on file as of 09/17/2018.     Activities of Daily Living In your present state of health, do you have any difficulty performing the following activities: 09/17/2018 04/05/2018  Hearing? N N  Vision? Y N  Comment reading glasses -  Difficulty concentrating or making decisions? N N  Walking or climbing stairs? N N  Dressing or bathing? N N  Doing errands, shopping? N N  Preparing Food and eating ? N -  Using the Toilet? N -  In the past six months, have you accidently leaked urine? N -  Do you have problems with loss of bowel control? N -  Managing your Medications? N -  Managing your Finances? N -  Housekeeping or managing your Housekeeping? N -  Some recent data might be hidden    Patient Care Team: Richarda Osmond, DO as PCP - General (Family Medicine) Skeet Latch, MD as PCP - Cardiology (Cardiology) Key, Nelia Shi, NP as Nurse Practitioner (Gynecology) Donato Heinz, MD as Consulting Physician (Nephrology) Kathrynn Ducking, MD as Consulting Physician  (Neurology) Letta Pate Luanna Salk, MD as Consulting Physician (Physical Medicine and Rehabilitation)    Assessment:   This is a routine wellness examination for Tayah.  Exercise Activities and Dietary recommendations Current Exercise Habits: The patient does not participate in regular exercise at present, Exercise limited by: orthopedic condition(s)  Patient does walk with her client everyday for 20 minutes at a time. However, this is not "vigorous" walking.   Goals    . Weight (lb) < 160 lb (72.6 kg)       Fall Risk Patient did fall this March in her shower. Patient stated the bathtub was slippery and she slipped. Patient fractured two places in her back. Discussed the importance of having a shower mat, to prevent future occurrences.   Is the patient's home free of loose throw rugs in walkways, pet beds, electrical cords, etc?   yes      Grab bars in the bathroom? yes      Handrails on the stairs?   yes      Adequate lighting?   yes  Patient rating of health (0-10) scale: 7  Depression Screen PHQ 2/9 Scores 09/17/2018 09/17/2018 06/04/2018 05/18/2018  PHQ - 2 Score 0 0 0 0  PHQ- 9 Score - - - -     Cognitive Function   6CIT Screen 09/17/2018  What Year? 0 points  What month? 0 points  What time? 0 points  Count back from 20 0 points  Months in reverse 0 points  Repeat phrase 0 points  Total Score 0    Immunization History  Administered Date(s) Administered  . Influenza,inj,Quad PF,6+ Mos 01/25/2016, 01/02/2017  . Pneumococcal Conjugate-13 07/30/2017  . Tdap 01/25/2016    Screening Tests Health Maintenance  Topic Date Due  . COLONOSCOPY  10/31/2001  . MAMMOGRAM  07/01/2015  . DEXA SCAN  10/31/2016  .  PNA vac Low Risk Adult (2 of 2 - PPSV23) 07/31/2018  . INFLUENZA VACCINE  10/09/2018  . TETANUS/TDAP  01/24/2026  . Hepatitis C Screening  Completed    Cancer Screenings: Lung: Low Dose CT Chest recommended if Age 77-80 years, 30 pack-year currently smoking OR  have quit w/in 15years. Patient does qualify. Breast:  Up to date on Mammogram? No   Up to date of Bone Density/Dexa? No Colorectal: No- offered to refer patient and provide handout. Patient sees Dr. Earlean Shawl already. Last one over 10 years ago, WNL.  Additional Screenings: Hepatitis C Screening: 04/02/2015  Plan:  Call Dr. Oswald Hillock office to schedule your colonoscopy!  Call Dannielle Burn office to schedule your yearly GYN exam and annual Mammogram! Follow-up with new PCP in the fall for your annual flu vaccine!  I have personally reviewed and noted the following in the patient's chart:   . Medical and social history . Use of alcohol, tobacco or illicit drugs  . Current medications and supplements . Functional ability and status . Nutritional status . Physical activity . Advanced directives . List of other physicians . Hospitalizations, surgeries, and ER visits in previous 12 months . Vitals . Screenings to include cognitive, depression, and falls . Referrals and appointments  In addition, I have reviewed and discussed with patient certain preventive protocols, quality metrics, and best practice recommendations. A written personalized care plan for preventive services as well as general preventive health recommendations were provided to patient.  Dorna Bloom, [place]  09/17/2018   I have reviewed this visit and agree with the documentation.

## 2018-09-26 ENCOUNTER — Other Ambulatory Visit: Payer: Self-pay | Admitting: Neurology

## 2018-10-05 ENCOUNTER — Other Ambulatory Visit: Payer: Self-pay

## 2018-10-05 ENCOUNTER — Telehealth (INDEPENDENT_AMBULATORY_CARE_PROVIDER_SITE_OTHER): Payer: Medicare Other | Admitting: Family Medicine

## 2018-10-05 DIAGNOSIS — R6889 Other general symptoms and signs: Secondary | ICD-10-CM | POA: Diagnosis not present

## 2018-10-05 DIAGNOSIS — Z20828 Contact with and (suspected) exposure to other viral communicable diseases: Secondary | ICD-10-CM | POA: Diagnosis not present

## 2018-10-05 DIAGNOSIS — Z20822 Contact with and (suspected) exposure to covid-19: Secondary | ICD-10-CM | POA: Insufficient documentation

## 2018-10-05 NOTE — Progress Notes (Signed)
[place] Telemedicine Visit  Patient consented to have virtual visit. Method of visit: Telephone  Encounter participants: Patient: [name] - located at home Provider: Rory Percy - located at [facility] Others (if applicable): n/a  Chief Complaint: COVID exposure  HPI:  Patient states her brother was diagnosed with pneumonia over the weekend and his doctor called yesterday informing him a positive COVID result.  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  She was last around him on Sunday.  She is currently asymptomatic and denies cough, congestion, fever, sore throat, difficulties breathing.  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  Her brother has been informing close contacts of the need to get tested.  ROS: per HPI  Pertinent PMHx: HTN, PVD, migraine, h/o PE, GERD, HLD, anxiety  Exam:  Respiratory: Speaks in full sentences, no respiratory distress  Assessment/Plan:  Exposure to Covid-19 Virus Please contact with positive COVID result, currently asymptomatic.  No underlying lung conditions, though does have prior history of PE.  Placed order for COVID testing with [facility].  Gave patient instructions regarding testing as well as home isolation guidelines for 2 weeks or until negative result.  Guidelines mailed to patient's home.  Advised to continue wearing a mask, washing her hands, and practicing social distancing per recommended guidelines.  Return precautions and reasons to present to the ED given.  Patient verbalized understanding.  Time spent during visit with patient: 6 minutes

## 2018-10-05 NOTE — Assessment & Plan Note (Addendum)
Please contact with positive COVID result, currently asymptomatic.  No underlying lung conditions, though does have prior history of PE.  Placed order for COVID testing with [facility].  Gave patient instructions regarding testing as well as home isolation guidelines for 2 weeks or until negative result.  Guidelines mailed to patient's home.  Advised to continue wearing a mask, washing her hands, and practicing social distancing per recommended guidelines.  Return precautions and reasons to present to the ED given.  Patient verbalized understanding.

## 2018-10-05 NOTE — Progress Notes (Signed)
171lb bp- n/a t- no fever [id number] - [place], [place] - [address]   Pt gave consent to telephone visit Salvatore Marvel, CMA

## 2018-10-05 NOTE — Patient Instructions (Signed)
Person Under Monitoring Name: [name]  Location: [place] [address]   Infection Prevention Recommendations for Individuals Confirmed to have, or Being Evaluated for, 2019 Novel Coronavirus (COVID-19) Infection Who Receive Care at Home  Individuals who are confirmed to have, or are being evaluated for, COVID-19 should follow the prevention steps below until a healthcare provider or local or state health department says they can return to normal activities.  Stay home except to get medical care You should restrict activities outside your home, except for getting medical care. Do not go to work, school, or public areas, and do not use public transportation or taxis.  Call ahead before visiting your doctor Before your medical appointment, call the healthcare provider and tell them that you have, or are being evaluated for, COVID-19 infection. This will help the healthcare provider's office take steps to keep other people from getting infected. Ask your healthcare provider to call the local or state health department.  Monitor your symptoms Seek prompt medical attention if your illness is worsening (e.g., difficulty breathing). Before going to your medical appointment, call the healthcare provider and tell them that you have, or are being evaluated for, COVID-19 infection. Ask your healthcare provider to call the local or state health department.  Wear a facemask You should wear a facemask that covers your nose and mouth when you are in the same room with other people and when you visit a healthcare provider. People who live with or visit you should also wear a facemask while they are in the same room with you.  Separate yourself from other people in your home As much as possible, you should stay in a different room from other people in your home. Also, you should use a separate bathroom, if available.  Avoid sharing household items You should not  share dishes, drinking glasses, cups, eating utensils, towels, bedding, or other items with other people in your home. After using these items, you should wash them thoroughly with soap and water.  Cover your coughs and sneezes Cover your mouth and nose with a tissue when you cough or sneeze, or you can cough or sneeze into your sleeve. Throw used tissues in a lined trash can, and immediately wash your hands with soap and water for at least 20 seconds or use an alcohol-based hand rub.  Wash your [facility] your hands often and thoroughly with soap and water for at least 20 seconds. You can use an alcohol-based hand sanitizer if soap and water are not available and if your hands are not visibly dirty. Avoid touching your eyes, nose, and mouth with unwashed hands.   Prevention Steps for Caregivers and Household Members of Individuals Confirmed to have, or Being Evaluated for, COVID-19 Infection Being Cared for in the Home  If you live with, or provide care at home for, a person confirmed to have, or being evaluated for, COVID-19 infection please follow these guidelines to prevent infection:  Follow healthcare provider's instructions Make sure that you understand and can help the patient follow any healthcare provider instructions for all care.  Provide for the patient's basic needs You should help the patient with basic needs in the home and provide support for getting groceries, prescriptions, and other personal needs.  Monitor the patient's symptoms If they are getting sicker, call his or her medical provider and tell them that the patient has, or is being evaluated for, COVID-19 infection. This will help the healthcare provider's  office take steps to keep other people from getting infected. Ask the healthcare provider to call the local or state health department.  Limit the number of people who have contact with the patient  If possible, have only one caregiver for the patient.   Other household members should stay in another home or place of residence. If this is not possible, they should stay  in another room, or be separated from the patient as much as possible. Use a separate bathroom, if available.  Restrict visitors who do not have an essential need to be in the home.  Keep older adults, very young children, and other sick people away from the patient Keep older adults, very young children, and those who have compromised immune systems or chronic health conditions away from the patient. This includes people with chronic heart, lung, or kidney conditions, diabetes, and cancer.  Ensure good ventilation Make sure that shared spaces in the home have good air flow, such as from an air conditioner or an opened window, weather permitting.  Wash your hands often  Wash your hands often and thoroughly with soap and water for at least 20 seconds. You can use an alcohol based hand sanitizer if soap and water are not available and if your hands are not visibly dirty.  Avoid touching your eyes, nose, and mouth with unwashed hands.  Use disposable paper towels to dry your hands. If not available, use dedicated cloth towels and replace them when they become wet.  Wear a facemask and gloves  Wear a disposable facemask at all times in the room and gloves when you touch or have contact with the patient's blood, body fluids, and/or secretions or excretions, such as sweat, saliva, sputum, nasal mucus, vomit, urine, or feces.  Ensure the mask fits over your nose and mouth tightly, and do not touch it during use.  Throw out disposable facemasks and gloves after using them. Do not reuse.  Wash your hands immediately after removing your facemask and gloves.  If your personal clothing becomes contaminated, carefully remove clothing and launder. Wash your hands after handling contaminated clothing.  Place all used disposable facemasks, gloves, and other waste in a lined container  before disposing them with other household waste.  Remove gloves and wash your hands immediately after handling these items.  Do not share dishes, glasses, or other household items with the patient  Avoid sharing household items. You should not share dishes, drinking glasses, cups, eating utensils, towels, bedding, or other items with a patient who is confirmed to have, or being evaluated for, COVID-19 infection.  After the person uses these items, you should wash them thoroughly with soap and water.  Wash laundry thoroughly  Immediately remove and wash clothes or bedding that have blood, body fluids, and/or secretions or excretions, such as sweat, saliva, sputum, nasal mucus, vomit, urine, or feces, on them.  Wear gloves when handling laundry from the patient.  Read and follow directions on labels of laundry or clothing items and detergent. In general, wash and dry with the warmest temperatures recommended on the label.  Clean all areas the individual has used often  Clean all touchable surfaces, such as counters, tabletops, doorknobs, bathroom fixtures, toilets, phones, keyboards, tablets, and bedside tables, every day. Also, clean any surfaces that may have blood, body fluids, and/or secretions or excretions on them.  Wear gloves when cleaning surfaces the patient has come in contact with.  Use a diluted bleach solution (e.g., dilute bleach with 1  part bleach and 10 parts water) or a household disinfectant with a label that says EPA-registered for coronaviruses. To make a bleach solution at home, add 1 tablespoon of bleach to 1 quart (4 cups) of water. For a larger supply, add  cup of bleach to 1 gallon (16 cups) of water.  Read labels of cleaning products and follow recommendations provided on product labels. Labels contain instructions for safe and effective use of the cleaning product including precautions you should take when applying the product, such as wearing gloves or eye  protection and making sure you have good ventilation during use of the product.  Remove gloves and wash hands immediately after cleaning.  Monitor yourself for signs and symptoms of illness Caregivers and household members are considered close contacts, should monitor their health, and will be asked to limit movement outside of the home to the extent possible. Follow the monitoring steps for close contacts listed on the symptom monitoring form.   ? If you have additional questions, contact your local health department or call the epidemiologist on call at [phone] (available 24/7). ? This guidance is subject to change. For the most up-to-date guidance from [facility], please refer to their website: YouBlogs.pl

## 2018-10-07 LAB — NOVEL CORONAVIRUS, NAA: SARS-CoV-2, NAA: NOT DETECTED

## 2018-10-08 ENCOUNTER — Other Ambulatory Visit: Payer: Self-pay

## 2018-10-08 ENCOUNTER — Ambulatory Visit (INDEPENDENT_AMBULATORY_CARE_PROVIDER_SITE_OTHER): Payer: Medicare Other | Admitting: Family Medicine

## 2018-10-08 VITALS — BP 100/66 | HR 71 | Ht 67.0 in | Wt 178.1 lb

## 2018-10-08 DIAGNOSIS — N390 Urinary tract infection, site not specified: Secondary | ICD-10-CM | POA: Diagnosis not present

## 2018-10-08 DIAGNOSIS — R300 Dysuria: Secondary | ICD-10-CM

## 2018-10-08 DIAGNOSIS — I2584 Coronary atherosclerosis due to calcified coronary lesion: Secondary | ICD-10-CM

## 2018-10-08 DIAGNOSIS — I2510 Atherosclerotic heart disease of native coronary artery without angina pectoris: Secondary | ICD-10-CM

## 2018-10-08 LAB — POCT URINALYSIS DIP (MANUAL ENTRY)
Bilirubin, UA: NEGATIVE
Glucose, UA: NEGATIVE mg/dL
Ketones, POC UA: NEGATIVE mg/dL
Leukocytes, UA: NEGATIVE
Nitrite, UA: NEGATIVE
Protein Ur, POC: NEGATIVE mg/dL
Spec Grav, UA: <=1.005 — AB (ref 1.010–1.025)
Urobilinogen, UA: 0.2 E.U./dL
pH, UA: 5.5 (ref 5.0–8.0)

## 2018-10-08 LAB — POCT UA - MICROSCOPIC ONLY

## 2018-10-08 MED ORDER — CEPHALEXIN 500 MG PO CAPS: 500.0000 mg | ORAL_CAPSULE | Freq: Four times a day (QID) | ORAL | 0 refills | Status: DC

## 2018-10-08 NOTE — Patient Instructions (Signed)
Thank you for coming to see me today. It was a pleasure! Today we talked about:   Your urinary tract infection and possible kidney infection will need to be treated with antibiotics.  I have sent in Keflex for you to take 4 times a day for 7 days.  We will obtain a urine culture and we will call you with these results and change antibiotic if this does not cover it.  I recommend that you let your nephrologist know about these multiple kidney infections in order to determine if something further should be done.  An ultrasound of your kidney in February was grossly normal.  Please follow-up if you have any worsening symptoms, develop any fevers, chills, vomiting.  If you have any questions or concerns, please do not hesitate to call the office at [phone].  Take Care,   [place] Bobbie Valletta, DO

## 2018-10-08 NOTE — Assessment & Plan Note (Addendum)
Patient reports that her symptoms are consistent with previous UTIs will treat.  Patient with significant CVA tenderness on the right. UA only positive for trace blood, microscopic eval pending.   Urine culture shows E. coli in urine which is intermediate susceptibility to Keflex.  Called and discussed with the patient on 8/6 and she reports that she is feeling much better but she is still having some symptoms.  She denies any fevers or fatigue.  She reports that she is having mild dysuria and her back pain has improved. given the susceptibility report will discontinue Keflex and start patient on Bactrim.  She is again encouraged to follow-up with her nephrologist and strict return precautions were discussed and patient voiced understanding.

## 2018-10-08 NOTE — Progress Notes (Signed)
  Subjective:  Patient ID: [name]  DOB: Apr 08, 1950 MRN: [id number]  [name] is a 68 y.o. female with a PMH of TIA, HTN, PE, GERD, OSA, h/o recurrent UTIs, here today for concern for UTI.   HPI:  Right flank pain: -Patient hospitalized in 03/2018 and found to have urosepsis.  On 04/23/2018 patient again returned with right flank pain and was treated for UTI.  Had renal ultrasound at this time which was WNL.  In 05/18/2018 patient again return to office for concerns of another UTI which was not treated with antibiotics and improved.  Then on 08/18/2018 patient again with signs and symptoms of UTI and concern for [place].  ROS: as mentioned in HPI  Social hx: Denies use of illicit drugs, alcohol use Smoking status reviewed  Patient Active Problem List   Diagnosis Date Noted  . Exposure to Covid-19 Virus 10/05/2018  . Pre-operative clearance 04/21/2018  . PVD (peripheral vascular disease) ([place]) 04/20/2018  . CRI (chronic renal insufficiency), stage 3 (moderate) ([place]) 04/20/2018  . Pyelonephritis 04/05/2018  . Complicated UTI (urinary tract infection) 04/04/2018  . Plantar fasciitis 01/10/2018  . Chronic anxiety 01/08/2018  . Gastroesophageal reflux disease 01/08/2018  . Menopausal syndrome 01/08/2018  . Pulmonary embolism ([place])   . Coronary artery calcification   . Osteoarthritis of knee 04/08/2017  . Status post cholecystectomy   . Hyperlipidemia LDL goal <70 04/02/2015  . Chronic migraine without aura, with status migrainosus 09/26/2014  . Migraine variant 10/10/2013  . TIA (transient ischemic attack) 10/10/2013  . Essential hypertension 07/18/2013  . 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 Neuralgia, neuritis, and radiculitis, unspecified 02/04/2012  . Myofascial muscle pain 07/15/2011     Objective:  BP 100/66   Pulse 71   Ht 5\' 7"  (1.702 m)   Wt 178 lb 2 oz (80.8 kg)   SpO2 97%   BMI 27.90 kg/m   Vitals and nursing note reviewed  General: NAD,  pleasant, well-appearing Cardiac: RRR, normal heart sounds, no m/r/g Pulm: normal effort, CTAB GI: soft, nontender, nondistended Back: CVA tenderness noted on right Extremities: no edema or cyanosis. WWP. Skin: warm and dry, no rashes noted Neuro: alert and oriented, no focal deficits Psych: normal affect, normal thought content  Assessment & Plan:   Complicated UTI (urinary tract infection) Patient reports that her symptoms are consistent with previous UTIs will treat.  Patient with significant CVA tenderness on the right. UA only positive for trace blood, microscopic eval pending.   Urine culture shows E. coli in urine which is intermediate susceptibility to Keflex.  Called and discussed with the patient on 8/6 and she reports that she is feeling much better but she is still having some symptoms.  She denies any fevers or fatigue.  She reports that she is having mild dysuria and her back pain has improved. given the susceptibility report will discontinue Keflex and start patient on Bactrim.  She is again encouraged to follow-up with her nephrologist and strict return precautions were discussed and patient voiced understanding.    [place] Charle Mclaurin, DO Family Medicine Resident PGY-3

## 2018-10-10 LAB — URINE CULTURE

## 2018-10-14 MED ORDER — SULFAMETHOXAZOLE-TRIMETHOPRIM 800-160 MG PO TABS: 1.0000 | ORAL_TABLET | Freq: Two times a day (BID) | ORAL | 0 refills | Status: DC

## 2018-10-27 ENCOUNTER — Other Ambulatory Visit: Payer: Self-pay | Admitting: *Deleted

## 2018-10-28 MED ORDER — PROPRANOLOL HCL 10 MG PO TABS: 10.0000 mg | ORAL_TABLET | Freq: Two times a day (BID) | ORAL | 3 refills | Status: DC

## 2018-11-03 ENCOUNTER — Other Ambulatory Visit: Payer: Self-pay

## 2018-11-03 ENCOUNTER — Ambulatory Visit (INDEPENDENT_AMBULATORY_CARE_PROVIDER_SITE_OTHER): Payer: Medicare Other | Admitting: Family Medicine

## 2018-11-03 ENCOUNTER — Encounter: Payer: Self-pay | Admitting: Family Medicine

## 2018-11-03 VITALS — BP 130/74 | HR 75 | Wt 172.6 lb

## 2018-11-03 DIAGNOSIS — R5383 Other fatigue: Secondary | ICD-10-CM | POA: Diagnosis not present

## 2018-11-03 DIAGNOSIS — N390 Urinary tract infection, site not specified: Secondary | ICD-10-CM

## 2018-11-03 DIAGNOSIS — I2510 Atherosclerotic heart disease of native coronary artery without angina pectoris: Secondary | ICD-10-CM

## 2018-11-03 DIAGNOSIS — I2584 Coronary atherosclerosis due to calcified coronary lesion: Secondary | ICD-10-CM | POA: Diagnosis not present

## 2018-11-03 LAB — POCT URINALYSIS DIP (MANUAL ENTRY)
Bilirubin, UA: NEGATIVE
Blood, UA: NEGATIVE
Glucose, UA: NEGATIVE mg/dL
Ketones, POC UA: NEGATIVE mg/dL
Leukocytes, UA: NEGATIVE
Nitrite, UA: NEGATIVE
Protein Ur, POC: NEGATIVE mg/dL
Spec Grav, UA: 1.015 (ref 1.010–1.025)
Urobilinogen, UA: 0.2 E.U./dL
pH, UA: 5.5 (ref 5.0–8.0)

## 2018-11-03 NOTE — Progress Notes (Signed)
Subjective:  [name] is a 68 y.o. female who presents to the [facility] today with a chief complaint of UTI follow-up.   HPI: Complicated UTI (urinary tract infection) Prior UTI, 2 courses of antibiotics, second was Bactrim after urine culture showed sensitivity of this.  No dysuria, complaints of urine disorder.  No pelvic pain.  No fevers.  Patient does have some mild back pain but also has a history of chronic back pain.  She thinks that this is concerning for kidney infection.  Fatigue Patient states that they have mild generalized fatigue, they think this might be a result of UTI.  We did discuss that sometimes it can just take a while to regain near full energy and strength but that the urine sample today showed that there was no urinary tract infection current.  She would like additional work-up to see if there are other obvious causes of the fatigue.   Objective:  Physical Exam: BP 130/74   Pulse 75   Wt 172 lb 9.6 oz (78.3 kg)   SpO2 98%   BMI 27.03 kg/m   Gen: NAD, conversing comfortably CV: RRR with no murmurs appreciated Pulm: NWOB, CTAB with no crackles, wheezes, or rhonchi GI: Normal bowel sounds present. Soft, Nontender, Nondistended. MSK: no edema, cyanosis, or clubbing noted Skin: warm, dry Neuro: grossly normal, moves all extremities Psych: Normal affect and thought content  Results for orders placed or performed in visit on 11/03/18 (from the past 72 hour(s))  POCT urinalysis dipstick     Status: None   Collection Time: 11/03/18  3:06 PM  Result Value Ref Range   Color, UA yellow yellow   Clarity, UA clear clear   Glucose, UA negative negative mg/dL   Bilirubin, UA negative negative   Ketones, POC UA negative negative mg/dL   Spec Grav, UA 1.015 1.010 - 1.025   Blood, UA negative negative   pH, UA 5.5 5.0 - 8.0   Protein Ur, POC negative negative mg/dL   Urobilinogen, UA 0.2 0.2 or 1.0 E.U./dL   Nitrite, UA Negative Negative   Leukocytes, UA  Negative Negative  TSH     Status: None   Collection Time: 11/03/18  3:33 PM  Result Value Ref Range   TSH 1.610 0.450 - 4.500 uIU/mL  CBC     Status: None   Collection Time: 11/03/18  3:33 PM  Result Value Ref Range   WBC 4.9 3.4 - 10.8 x10E3/uL   RBC 4.50 3.77 - 5.28 x10E6/uL   Hemoglobin 12.8 11.1 - 15.9 g/dL   Hematocrit 38.6 34.0 - 46.6 %   MCV 86 79 - 97 fL   MCH 28.4 26.6 - 33.0 pg   MCHC 33.2 31.5 - 35.7 g/dL   RDW 14.8 11.7 - 15.4 %   Platelets 206 150 - 450 x10E3/uL     Assessment/Plan:  Fatigue Patient states that they have mild generalized fatigue, they think this might be a result of UTI.  We did discuss that sometimes it can just take a while to regain near full energy and strength but that the urine sample today showed that there was no urinary tract infection current.  We also ordered CBC and TSH which were both normal (letter produced to be mailed to patient).  Complicated UTI (urinary tract infection) Prior UTI, 2 courses of antibiotics, second was Bactrim after urine culture showed sensitivity of this.  Repeat UA here at this appointment showed no current urinary tract infection.  Patient does have regular scheduled nephrology follow-up and suggested to continue doing that but that we do not think that there is any urinary contribution to her current symptoms of fatigue and mild back pain.   Sherene Sires, DO FAMILY MEDICINE RESIDENT - PGY3 11/04/2018 9:04 AM

## 2018-11-04 ENCOUNTER — Encounter: Payer: Self-pay | Admitting: Family Medicine

## 2018-11-04 DIAGNOSIS — R5383 Other fatigue: Secondary | ICD-10-CM | POA: Insufficient documentation

## 2018-11-04 LAB — CBC
Hematocrit: 38.6 % (ref 34.0–46.6)
Hemoglobin: 12.8 g/dL (ref 11.1–15.9)
MCH: 28.4 pg (ref 26.6–33.0)
MCHC: 33.2 g/dL (ref 31.5–35.7)
MCV: 86 fL (ref 79–97)
Platelets: 206 10*3/uL (ref 150–450)
RBC: 4.5 x10E6/uL (ref 3.77–5.28)
RDW: 14.8 % (ref 11.7–15.4)
WBC: 4.9 10*3/uL (ref 3.4–10.8)

## 2018-11-04 LAB — TSH: TSH: 1.61 u[IU]/mL (ref 0.450–4.500)

## 2018-11-04 NOTE — Assessment & Plan Note (Signed)
Prior UTI, 2 courses of antibiotics, second was Bactrim after urine culture showed sensitivity of this.  Repeat UA here at this appointment showed no current urinary tract infection.  Patient does have regular scheduled nephrology follow-up and suggested to continue doing that but that we do not think that there is any urinary contribution to her current symptoms of fatigue and mild back pain.

## 2018-11-04 NOTE — Progress Notes (Signed)
Letter produced for normal lab results.

## 2018-11-04 NOTE — Assessment & Plan Note (Signed)
Patient states that they have mild generalized fatigue, they think this might be a result of UTI.  We did discuss that sometimes it can just take a while to regain near full energy and strength but that the urine sample today showed that there was no urinary tract infection current.  We also ordered CBC and TSH which were both normal (letter produced to be mailed to patient).

## 2018-11-05 DIAGNOSIS — M48061 Spinal stenosis, lumbar region without neurogenic claudication: Secondary | ICD-10-CM | POA: Diagnosis not present

## 2018-11-05 DIAGNOSIS — M25552 Pain in left hip: Secondary | ICD-10-CM | POA: Diagnosis not present

## 2018-11-05 DIAGNOSIS — M25562 Pain in left knee: Secondary | ICD-10-CM | POA: Diagnosis not present

## 2018-11-05 DIAGNOSIS — M1712 Unilateral primary osteoarthritis, left knee: Secondary | ICD-10-CM | POA: Diagnosis not present

## 2018-11-13 ENCOUNTER — Other Ambulatory Visit: Payer: Self-pay | Admitting: Neurology

## 2018-12-03 DIAGNOSIS — M1712 Unilateral primary osteoarthritis, left knee: Secondary | ICD-10-CM | POA: Diagnosis not present

## 2018-12-09 ENCOUNTER — Ambulatory Visit (INDEPENDENT_AMBULATORY_CARE_PROVIDER_SITE_OTHER): Payer: Medicare Other | Admitting: Family Medicine

## 2018-12-09 ENCOUNTER — Ambulatory Visit: Payer: Medicare Other | Admitting: Adult Health

## 2018-12-09 ENCOUNTER — Encounter: Payer: Self-pay | Admitting: Family Medicine

## 2018-12-09 ENCOUNTER — Other Ambulatory Visit: Payer: Self-pay

## 2018-12-09 VITALS — BP 102/78 | HR 67 | Temp 96.9°F | Ht 67.0 in | Wt 177.2 lb

## 2018-12-09 DIAGNOSIS — R208 Other disturbances of skin sensation: Secondary | ICD-10-CM

## 2018-12-09 DIAGNOSIS — I2584 Coronary atherosclerosis due to calcified coronary lesion: Secondary | ICD-10-CM

## 2018-12-09 DIAGNOSIS — G4700 Insomnia, unspecified: Secondary | ICD-10-CM | POA: Diagnosis not present

## 2018-12-09 DIAGNOSIS — G43719 Chronic migraine without aura, intractable, without status migrainosus: Secondary | ICD-10-CM | POA: Diagnosis not present

## 2018-12-09 DIAGNOSIS — G459 Transient cerebral ischemic attack, unspecified: Secondary | ICD-10-CM

## 2018-12-09 DIAGNOSIS — I2510 Atherosclerotic heart disease of native coronary artery without angina pectoris: Secondary | ICD-10-CM

## 2018-12-09 MED ORDER — NURTEC 75 MG PO TBDP: 75.0000 mg | ORAL_TABLET | Freq: Every day | ORAL | 11 refills | Status: DC | PRN

## 2018-12-09 NOTE — Patient Instructions (Addendum)
We will continue zonisamide and tizanidine. Stop Ajovy for now, we will monitor headaches closely  Continue gabapentin and doxepin as prescribed.   We will try Nurtec for abortive therapy  Follow up in 6 months  Insomnia Insomnia is a sleep disorder that makes it difficult to fall asleep or stay asleep. Insomnia can cause fatigue, low energy, difficulty concentrating, mood swings, and poor performance at work or school. There are three different ways to classify insomnia:  Difficulty falling asleep.  Difficulty staying asleep.  Waking up too early in the morning. Any type of insomnia can be long-term (chronic) or short-term (acute). Both are common. Short-term insomnia usually lasts for three months or less. Chronic insomnia occurs at least three times a week for longer than three months. What are the causes? Insomnia may be caused by another condition, situation, or substance, such as:  Anxiety.  Certain medicines.  Gastroesophageal reflux disease (GERD) or other gastrointestinal conditions.  Asthma or other breathing conditions.  Restless legs syndrome, sleep apnea, or other sleep disorders.  Chronic pain.  Menopause.  Stroke.  Abuse of alcohol, tobacco, or illegal drugs.  Mental health conditions, such as depression.  Caffeine.  Neurological disorders, such as Alzheimer's disease.  An overactive thyroid (hyperthyroidism). Sometimes, the cause of insomnia may not be known. What increases the risk? Risk factors for insomnia include:  Gender. Women are affected more often than men.  Age. Insomnia is more common as you get older.  Stress.  Lack of exercise.  Irregular work schedule or working night shifts.  Traveling between different time zones.  Certain medical and mental health conditions. What are the signs or symptoms? If you have insomnia, the main symptom is having trouble falling asleep or having trouble staying asleep. This may lead to other  symptoms, such as:  Feeling fatigued or having low energy.  Feeling nervous about going to sleep.  Not feeling rested in the morning.  Having trouble concentrating.  Feeling irritable, anxious, or depressed. How is this diagnosed? This condition may be diagnosed based on:  Your symptoms and medical history. Your health care provider may ask about: ? Your sleep habits. ? Any medical conditions you have. ? Your mental health.  A physical exam. How is this treated? Treatment for insomnia depends on the cause. Treatment may focus on treating an underlying condition that is causing insomnia. Treatment may also include:  Medicines to help you sleep.  Counseling or therapy.  Lifestyle adjustments to help you sleep better. Follow these instructions at home: Eating and drinking   Limit or avoid alcohol, caffeinated beverages, and cigarettes, especially close to bedtime. These can disrupt your sleep.  Do not eat a large meal or eat spicy foods right before bedtime. This can lead to digestive discomfort that can make it hard for you to sleep. Sleep habits   Keep a sleep diary to help you and your health care provider figure out what could be causing your insomnia. Write down: ? When you sleep. ? When you wake up during the night. ? How well you sleep. ? How rested you feel the next day. ? Any side effects of medicines you are taking. ? What you eat and drink.  Make your bedroom a dark, comfortable place where it is easy to fall asleep. ? Put up shades or blackout curtains to block light from outside. ? Use a white noise machine to block noise. ? Keep the temperature cool.  Limit screen use before bedtime. This includes: ?  Watching TV. ? Using your smartphone, tablet, or computer.  Stick to a routine that includes going to bed and waking up at the same times every day and night. This can help you fall asleep faster. Consider making a quiet activity, such as reading, part of  your nighttime routine.  Try to avoid taking naps during the day so that you sleep better at night.  Get out of bed if you are still awake after 15 minutes of trying to sleep. Keep the lights down, but try reading or doing a quiet activity. When you feel sleepy, go back to bed. General instructions  Take over-the-counter and prescription medicines only as told by your health care provider.  Exercise regularly, as told by your health care provider. Avoid exercise starting several hours before bedtime.  Use relaxation techniques to manage stress. Ask your health care provider to suggest some techniques that may work well for you. These may include: ? Breathing exercises. ? Routines to release muscle tension. ? Visualizing peaceful scenes.  Make sure that you drive carefully. Avoid driving if you feel very sleepy.  Keep all follow-up visits as told by your health care provider. This is important. Contact a health care provider if:  You are tired throughout the day.  You have trouble in your daily routine due to sleepiness.  You continue to have sleep problems, or your sleep problems get worse. Get help right away if:  You have serious thoughts about hurting yourself or someone else. If you ever feel like you may hurt yourself or others, or have thoughts about taking your own life, get help right away. You can go to your nearest emergency department or call:  Your local emergency services (911 in the [place]  A suicide crisis helpline, such as the [facility] at 630-454-3924. This is open 24 hours a day. Summary  Insomnia is a sleep disorder that makes it difficult to fall asleep or stay asleep.  Insomnia can be long-term (chronic) or short-term (acute).  Treatment for insomnia depends on the cause. Treatment may focus on treating an underlying condition that is causing insomnia.  Keep a sleep diary to help you and your health care provider figure out  what could be causing your insomnia. This information is not intended to replace advice given to you by your health care provider. Make sure you discuss any questions you have with your health care provider. Document Released: 02/22/2000 Document Revised: 02/06/2017 Document Reviewed: 12/04/2016 Elsevier Patient Education  2020 [employer].   Migraine Headache A migraine headache is a very strong throbbing pain on one side or both sides of your head. This type of headache can also cause other symptoms. It can last from 4 hours to 3 days. Talk with your doctor about what things may bring on (trigger) this condition. What are the causes? The exact cause of this condition is not known. This condition may be triggered or caused by:  Drinking alcohol.  Smoking.  Taking medicines, such as: ? Medicine used to treat chest pain (nitroglycerin). ? Birth control pills. ? Estrogen. ? Some blood pressure medicines.  Eating or drinking certain products.  Doing physical activity. Other things that may trigger a migraine headache include:  Having a menstrual period.  Pregnancy.  Hunger.  Stress.  Not getting enough sleep or getting too much sleep.  Weather changes.  Tiredness (fatigue). What increases the risk?  Being 30-46 years old.  Being female.  Having a family history of  migraine headaches.  Being Caucasian.  Having depression or anxiety.  Being very overweight. What are the signs or symptoms?  A throbbing pain. This pain may: ? Happen in any area of the head, such as on one side or both sides. ? Make it hard to do daily activities. ? Get worse with physical activity. ? Get worse around bright lights or loud noises.  Other symptoms may include: ? Feeling sick to your stomach (nauseous). ? Vomiting. ? Dizziness. ? Being sensitive to bright lights, loud noises, or smells.  Before you get a migraine headache, you may get warning signs (an aura). An aura may  include: ? Seeing flashing lights or having blind spots. ? Seeing bright spots, halos, or zigzag lines. ? Having tunnel vision or blurred vision. ? Having numbness or a tingling feeling. ? Having trouble talking. ? Having weak muscles.  Some people have symptoms after a migraine headache (postdromal phase), such as: ? Tiredness. ? Trouble thinking (concentrating). How is this treated?  Taking medicines that: ? Relieve pain. ? Relieve the feeling of being sick to your stomach. ? Prevent migraine headaches.  Treatment may also include: ? Having acupuncture. ? Avoiding foods that bring on migraine headaches. ? Learning ways to control your body functions (biofeedback). ? Therapy to help you know and deal with negative thoughts (cognitive behavioral therapy). Follow these instructions at home: Medicines  Take over-the-counter and prescription medicines only as told by your doctor.  Ask your doctor if the medicine prescribed to you: ? Requires you to avoid driving or using heavy machinery. ? Can cause trouble pooping (constipation). You may need to take these steps to prevent or treat trouble pooping:  Drink enough fluid to keep your pee (urine) pale yellow.  Take over-the-counter or prescription medicines.  Eat foods that are high in fiber. These include beans, whole grains, and fresh fruits and vegetables.  Limit foods that are high in fat and sugar. These include fried or sweet foods. Lifestyle  Do not drink alcohol.  Do not use any products that contain nicotine or tobacco, such as cigarettes, e-cigarettes, and chewing tobacco. If you need help quitting, ask your doctor.  Get at least 8 hours of sleep every night.  Limit and deal with stress. General instructions      Keep a journal to find out what may bring on your migraine headaches. For example, write down: ? What you eat and drink. ? How much sleep you get. ? Any change in what you eat or drink. ? Any  change in your medicines.  If you have a migraine headache: ? Avoid things that make your symptoms worse, such as bright lights. ? It may help to lie down in a dark, quiet room. ? Do not drive or use heavy machinery. ? Ask your doctor what activities are safe for you.  Keep all follow-up visits as told by your doctor. This is important. Contact a doctor if:  You get a migraine headache that is different or worse than others you have had.  You have more than 15 headache days in one month. Get help right away if:  Your migraine headache gets very bad.  Your migraine headache lasts longer than 72 hours.  You have a fever.  You have a stiff neck.  You have trouble seeing.  Your muscles feel weak or like you cannot control them.  You start to lose your balance a lot.  You start to have trouble walking.  You pass  out (faint).  You have a seizure. Summary  A migraine headache is a very strong throbbing pain on one side or both sides of your head. These headaches can also cause other symptoms.  This condition may be treated with medicines and changes to your lifestyle.  Keep a journal to find out what may bring on your migraine headaches.  Contact a doctor if you get a migraine headache that is different or worse than others you have had.  Contact your doctor if you have more than 15 headache days in a month. This information is not intended to replace advice given to you by your health care provider. Make sure you discuss any questions you have with your health care provider. Document Released: 12/04/2007 Document Revised: 06/18/2018 Document Reviewed: 04/08/2018 Elsevier Patient Education  2020 [employer].

## 2018-12-09 NOTE — Progress Notes (Signed)
PATIENT: [name] DOB: 05-25-1950  REASON FOR VISIT: follow up HISTORY FROM: patient  Chief Complaint  Patient presents with  . Follow-up    6 mon f/u. Alone. Rm 2. Patient mentioned that she has been having a headache for a week that she cant get rid of.      HISTORY OF PRESENT ILLNESS: Today 12/13/18 [name] is a 68 y.o. female here today for follow up for migraines. She started Ajovy about a year ago. She felt that it helped initially but after a few months it did not seem to help. She has daily dull headaches, usually behind the right eye. About 1-2 times a month she has more severe headache with light, sound sensitivity and nausea. She continues zonisamide 100mg  and tizanidine 2mg  daily. She takes gabapentin 600mg  1-2 times daily for headaches and dysesthesias in her left side. Doxepin helps with sleep. She is followed closely by PCP for BP and cholesterol management. She is intolerant to statins, now on Repatha. On Plavix.   HISTORY: (copied from [place] note on 03/06/2018)  Ms. [name] is a 68 year old female with a history of intractable migraine headaches.  She returns today for follow-up.  Overall she feels that she has done relatively well.  He continues to have a daily dull headache that is in the right temporal region.  However she states that she has not had a severe headache in the last 6 months.  She reports she does have photophobia with her headaches.  She states that if she misses her medication then her headache will worsen.  She reports that she was unable to obtain Aimovig.  She continues on Zonegran and tizanidine.  HISTORY 07/01/17 Ms. [name] a 68 year old female with a history of intractable migraine headaches. She returns today for follow-up. She reports that she continues to have a daily dull headache. She remains on Zonegran and tizanidine.She states that she did receive a letter about Aimovigbut was unable to follow-up on this  due to her brother being in the hospital. She states that she only has approximately one severe migraine a month. Her headaches seem to occur on the right side starting at the neck radiating to the eyes. She does have photophobia, phonophobia and nausea. She continues to take doxepin at night. She returns today for an evaluation.   REVIEW OF SYSTEMS: Out of a complete 14 system review of symptoms, the patient complains only of the following symptoms, memory loss, headaches, numbness, weakness and all other reviewed systems are negative.  ALLERGIES: Allergies  Allergen Reactions  . Iodinated Diagnostic Agents Shortness Of Breath and Itching    States she had this reaction connected with a MRI of the cervical spine.  . Shellfish Allergy Anaphylaxis and Other (See Comments)    All seafood  . Statins Other (See Comments)    Arthralgias (severe) with atorvastatin, mild arthralgias with rosuvastatin but willing to continue taking it    HOME MEDICATIONS: Outpatient Medications Prior to Visit  Medication Sig Dispense Refill  . clopidogrel (PLAVIX) 75 MG tablet TAKE 1 TABLET BY MOUTH DAILY (Patient taking differently: Take 75 mg by mouth daily. ) 30 tablet 5  . doxepin (SINEQUAN) 100 MG capsule Take 2 capsules (200 mg total) by mouth at bedtime. 180 capsule 1  . esomeprazole (NEXIUM) 20 MG capsule TAKE 1 CAPSULE(20 MG) BY MOUTH DAILY 30 capsule 5  . Evolocumab (REPATHA SURECLICK) [id number] MG/ML SOAJ Inject 140 mg into the skin every 14 (fourteen)  days. 23 pen 6  . ezetimibe (ZETIA) 10 MG tablet Take 10 mg by mouth daily.     . furosemide (LASIX) 40 MG tablet Take 40 mg by mouth daily.    Marland Kitchen gabapentin (NEURONTIN) 600 MG tablet TAKE 1 TABLET BY MOUTH TWICE DAILY 180 tablet 0  . lidocaine (LIDODERM) 5 % Place 2 patches onto the skin daily. Apply to 2 patches to affected area in the AM, then remove after 12 hours 60 patch 5  . ondansetron (ZOFRAN) 4 MG tablet Take 1 tablet (4 mg total) by mouth every 8  (eight) hours as needed for nausea or vomiting. 20 tablet 0  . propranolol (INDERAL) 10 MG tablet Take 1 tablet (10 mg total) by mouth 2 (two) times daily. 90 tablet 3  . sertraline (ZOLOFT) 25 MG tablet Take 25 mg by mouth daily. Make take a additional tab if anxiety worsens    . tiZANidine (ZANAFLEX) 2 MG tablet TAKE 1 TABLET(2 MG) BY MOUTH THREE TIMES DAILY 270 tablet 1  . zonisamide (ZONEGRAN) 100 MG capsule TAKE 1 CAPSULE(100 MG) BY MOUTH TWICE DAILY 60 capsule 2   No facility-administered medications prior to visit.     PAST MEDICAL HISTORY: Past Medical History:  Diagnosis Date  . Chronic kidney disease    stage 3  . GERD (gastroesophageal reflux disease)   . Hyperlipidemia   . Migraine headache   . Occipital neuralgia    Left  . Renal insufficiency   . Stroke [facility]) 10/2013    PAST SURGICAL HISTORY: Past Surgical History:  Procedure Laterality Date  . ABDOMINAL HYSTERECTOMY    . BILIARY STENT PLACEMENT N/A 12/12/2015   Procedure: BILIARY STENT PLACEMENT;  Surgeon: Doran Stabler, MD;  Location: [facility];  Service: Endoscopy;  Laterality: N/A;  . BLADDER REPAIR    . CARPAL TUNNEL RELEASE    . CERVICAL SPINE SURGERY    . CHOLECYSTECTOMY N/A 12/06/2015   Procedure: LAPAROSCOPIC CHOLECYSTECTOMY WITH  INTRAOPERATIVE CHOLANGIOGRAM;  Surgeon: Stark Klein, MD;  Location: [place];  Service: General;  Laterality: N/A;  . ELBOW SURGERY    . ERCP N/A 12/12/2015   Procedure: ENDOSCOPIC RETROGRADE CHOLANGIOPANCREATOGRAPHY (ERCP);  Surgeon: Doran Stabler, MD;  Location: [facility] ENDOSCOPY;  Service: Endoscopy;  Laterality: N/A;  . ESOPHAGOGASTRODUODENOSCOPY N/A 01/28/2016   Procedure: ESOPHAGOGASTRODUODENOSCOPY (EGD) Biliary STENT removal;  Surgeon: Doran Stabler, MD;  Location: [facility];  Service: Gastroenterology;  Laterality: N/A;  . IR GENERIC HISTORICAL  12/17/2015   IR US GUIDE BX ASP/DRAIN 12/17/2015 [facility]  . IR GENERIC HISTORICAL  12/17/2015   IR GUIDED DRAIN W  CATHETER PLACEMENT 12/17/2015 [facility]  . IR GENERIC HISTORICAL  12/17/2015   IR SINUS/FIST TUBE CHK-NON GI 12/17/2015 [facility]  . KNEE SURGERY    . LUNG SURGERY    . TEE WITHOUT CARDIOVERSION N/A 12/19/2015   Procedure: TRANSESOPHAGEAL ECHOCARDIOGRAM (TEE);  Surgeon: Josue Hector, MD;  Location[facility] ENDOSCOPY;  Service: Cardiovascular;  Laterality: N/A;    FAMILY HISTORY: Family History  Problem Relation Age of Onset  . Cancer Mother 3       breast  . Alzheimer's disease Mother   . Cancer Brother   . Hyperlipidemia Brother   . Prostate cancer Father   . Lung cancer Father   . Dementia Maternal Grandmother   . Cancer Maternal Grandfather   . Cancer Brother   . Hyperlipidemia Brother   . Dementia Brother  frontotemporal dementia    SOCIAL HISTORY: Social History   Socioeconomic History  . Marital status: Married    Spouse name: [name]  . Number of children: 1  . Years of education: 12+  . Highest education level: High school graduate  Occupational History  . Occupation: ACTIVITY DIRECTOR    Employer: [employer]  . Occupation: CNA  Social Needs  . Financial resource strain: Not hard at all  . Food insecurity    Worry: Never true    Inability: Never true  . Transportation needs    Medical: No    Non-medical: No  Tobacco Use  . Smoking status: Former Smoker    Packs/day: 0.30    Types: Cigarettes    Quit date: 11/01/2011    Years since quitting: 7.1  . Smokeless tobacco: Never Used  Substance and Sexual Activity  . Alcohol use: No    Alcohol/week: 0.0 standard drinks  . Drug use: No  . Sexual activity: Yes    Partners: Male    Birth control/protection: Surgical    Comment: 1 sexual partner in last 12 months: married  Lifestyle  . Physical activity    Days per week: 7 days    Minutes per session: 20 min  . Stress: Only a little  Relationships  . Social Herbalist on phone: Twice a week    Gets together: More than three  times a week    Attends religious service: 1 to 4 times per year    Active member of club or organization: No    Attends meetings of clubs or organizations: Never    Relationship status: Married  . Intimate partner violence    Fear of current or ex partner: No    Emotionally abused: No    Physically abused: No    Forced sexual activity: No  Other Topics Concern  . Not on file  Social History Narrative   Patient is married ([name]).   Patient has one child and grandchild. Her sons name is Shanon Brow. Patient sees them often.   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 Patient spends 6-10 hours with her 6 days a week.    Patient has a college education.   Patient is very active in her church.    Hobbies- outside crafts and church choir.      PHYSICAL EXAM  Vitals:   12/09/18 0752  BP: 102/78  Pulse: 67  Temp: (!) 96.9 F (36.1 C)  TempSrc: Oral  Weight: 177 lb 3.2 oz (80.4 kg)  Height: 5\' 7"  (1.702 m)   Body mass index is 27.75 kg/m.  Generalized: Well developed, in no acute distress  Cardiology: normal rate and rhythm, no murmur noted Neurological examination  Mentation: Alert oriented to time, place, history taking. Follows all commands speech and language fluent Cranial nerve II-XII: Pupils were equal round reactive to light. Extraocular movements were full, visual field were full on confrontational test. Facial sensation and strength were normal. Uvula tongue midline. Head turning and shoulder shrug  were normal and symmetric. Motor: The motor testing reveals 5 over 5 strength of all 4 extremities. Good symmetric motor tone is noted throughout.  Sensory: Sensory testing is intact to soft touch on all 4 extremities. No evidence of extinction is noted.  Coordination: Cerebellar testing reveals good finger-nose-finger and heel-to-shin bilaterally.  Gait and station: Gait is normal. Tandem gait is normal. Romberg is negative. No  drift  is seen.  Reflexes: Deep tendon reflexes are symmetric and normal bilaterally.   DIAGNOSTIC DATA (LABS, IMAGING, TESTING) - I reviewed patient records, labs, notes, testing and imaging myself where available.  No flowsheet data found.   Lab Results  Component Value Date   WBC 4.9 11/03/2018   HGB 12.8 11/03/2018   HCT 38.6 11/03/2018   MCV 86 11/03/2018   PLT 206 11/03/2018      Component Value Date/Time   NA 140 04/08/2018 0834   NA 141 12/09/2017 0914   K 3.8 04/08/2018 0834   CL 112 (H) 04/08/2018 0834   CO2 21 (L) 04/08/2018 0834   GLUCOSE 100 (H) 04/08/2018 0834   BUN 8 04/08/2018 0834   BUN 26 12/09/2017 0914   CREATININE 1.17 (H) 04/08/2018 0834   CREATININE 1.10 (H) 02/25/2016 0907   CALCIUM 8.5 (L) 04/08/2018 0834   PROT 6.1 08/16/2018 0811   ALBUMIN 4.2 08/16/2018 0811   AST 17 08/16/2018 0811   ALT 14 08/16/2018 0811   ALKPHOS 110 08/16/2018 0811   BILITOT 0.4 08/16/2018 0811   GFRNONAA 49 (L) 04/08/2018 0834   GFRNONAA 53 (L) 02/25/2016 0907   GFRAA 56 (L) 04/08/2018 0834   GFRAA 61 02/25/2016 0907   Lab Results  Component Value Date   CHOL 244 (H) 08/16/2018   HDL 44 08/16/2018   LDLCALC 174 (H) 08/16/2018   LDLDIRECT 156 (H) 03/27/2017   TRIG 129 08/16/2018   CHOLHDL 5.5 (H) 08/16/2018   Lab Results  Component Value Date   HGBA1C 5.5 07/30/2017   Lab Results  Component Value Date   VITAMINB12 290 07/21/2017   Lab Results  Component Value Date   TSH 1.610 11/03/2018       ASSESSMENT AND PLAN 68 y.o. year old female  has a past medical history of Chronic kidney disease, GERD (gastroesophageal reflux disease), Hyperlipidemia, Migraine headache, Occipital neuralgia, Renal insufficiency, and Stroke ([place]) (10/2013). here with     ICD-10-CM   1. Intractable chronic migraine without aura and without status migrainosus  G43.719 Rimegepant Sulfate (NURTEC) 75 MG TBDP  2. TIA (transient ischemic attack)  G45.9   3. Dysesthesia  R20.8   4.  Insomnia, unspecified type  G47.00     She will continue zonisamide and tizanidine for chronic tension type headaches. We will try Nurtec for abortive therapy as she is not a good candidate for triptan therapy. She will continue gabapentin for dysesthesia and doxepin for sleep. No signs of sleep apnea today but will continue to monitor. She will ensure adequate hydration and healthy well balanced diet. Headache journal may help identify triggers. She will follow up in 6 months, sooner if needed.    No orders of the defined types were placed in this encounter.    Meds ordered this encounter  Medications  . Rimegepant Sulfate (NURTEC) 75 MG TBDP    Sig: Take 75 mg by mouth daily as needed (take for abortive therapy of migraine, no more than 1 tablet in 24 hours or 10 per month).    Dispense:  10 tablet    Refill:  11    Order Specific Question:   Supervising Provider    Answer:   Melvenia Beam [id number], FNP-C 12/13/2018, 2:29 PM [facility] [address], [place] [place], [place] [address] [phone]

## 2018-12-10 DIAGNOSIS — M1712 Unilateral primary osteoarthritis, left knee: Secondary | ICD-10-CM | POA: Diagnosis not present

## 2018-12-13 ENCOUNTER — Encounter: Payer: Self-pay | Admitting: Family Medicine

## 2018-12-13 NOTE — Progress Notes (Signed)
I have read the note, and I agree with the clinical assessment and plan.  [name] K Avaley Coop   

## 2018-12-17 DIAGNOSIS — M1712 Unilateral primary osteoarthritis, left knee: Secondary | ICD-10-CM | POA: Diagnosis not present

## 2018-12-24 ENCOUNTER — Other Ambulatory Visit: Payer: Self-pay | Admitting: *Deleted

## 2018-12-24 ENCOUNTER — Other Ambulatory Visit: Payer: Self-pay | Admitting: Adult Health

## 2018-12-27 MED ORDER — SERTRALINE HCL 25 MG PO TABS: 25.0000 mg | ORAL_TABLET | Freq: Every day | ORAL | 3 refills | Status: DC

## 2019-01-03 ENCOUNTER — Telehealth: Payer: Self-pay | Admitting: *Deleted

## 2019-01-03 NOTE — Telephone Encounter (Signed)
Pt calls because she has always been on 50mg  of zoloft twice daily. With a script of 25mg  PRN for worsening anxiety.  The most recent med called in was 25mg .  Spoke with her and advised that somehow the 50mg  script fell off her list but I do see it in her history.  Advised I would send a message to MD to followup and send in correct dose.  Christen Bame, CMA

## 2019-01-04 ENCOUNTER — Other Ambulatory Visit: Payer: Self-pay | Admitting: Student in an Organized Health Care Education/Training Program

## 2019-01-04 MED ORDER — SERTRALINE HCL 50 MG PO TABS: 50.0000 mg | ORAL_TABLET | Freq: Every day | ORAL | 1 refills | Status: DC

## 2019-01-04 NOTE — Telephone Encounter (Signed)
I'm not sure how that happened. I have refilled the correct dose and frequency.

## 2019-01-05 ENCOUNTER — Ambulatory Visit
Admission: RE | Admit: 2019-01-05 | Discharge: 2019-01-05 | Disposition: A | Discharge status: Home / Self Care | Payer: Medicare Other | Source: Ambulatory Visit | Attending: Family Medicine | Admitting: Family Medicine

## 2019-01-05 ENCOUNTER — Other Ambulatory Visit: Payer: Self-pay

## 2019-01-05 ENCOUNTER — Telehealth (INDEPENDENT_AMBULATORY_CARE_PROVIDER_SITE_OTHER): Payer: Medicare Other | Admitting: Family Medicine

## 2019-01-05 DIAGNOSIS — R05 Cough: Secondary | ICD-10-CM | POA: Diagnosis not present

## 2019-01-05 DIAGNOSIS — R059 Cough, unspecified: Secondary | ICD-10-CM

## 2019-01-05 DIAGNOSIS — I2584 Coronary atherosclerosis due to calcified coronary lesion: Secondary | ICD-10-CM

## 2019-01-05 DIAGNOSIS — R053 Chronic cough: Secondary | ICD-10-CM | POA: Insufficient documentation

## 2019-01-05 DIAGNOSIS — I2510 Atherosclerotic heart disease of native coronary artery without angina pectoris: Secondary | ICD-10-CM

## 2019-01-05 DIAGNOSIS — R052 Subacute cough: Secondary | ICD-10-CM | POA: Insufficient documentation

## 2019-01-05 NOTE — Progress Notes (Signed)
[place] Telemedicine Visit I connected with  [name] on 01/05/19 by a video enabled telemedicine application and verified that I am speaking with the correct person using two identifiers.   I discussed the limitations of evaluation and management by telemedicine. The patient expressed understanding and agreed to proceed.  Patient consented to have virtual visit. Method of visit: Video  Encounter participants: Patient: [name] - located at Home Provider: Caroline More - located at [facility] Others (if applicable): None  Chief Complaint: cough  HPI: Cough Patient reports having a "terrible cough" x 2 weeks. This week it has progressively worsened. Denies sputum production. Denies fevers. No sore throat. Does report leg and feet edema. Patient states she can no longer see the veins in her feet. Pants are tighters than normal. Shoes are tighter than normal, can still put them on put the feel very tight. On Monday she took 2 fluid pills and that helped a little but not much (receives from her Nephrologist). Patient has CKD but not on dialysis. Reports sleeping on 3 pillows, but this is chronic for her. Does endorse PND. At home patient is on lasix 40mg  daily. Last echo on 07/24/2017 showing EF 55-60%. Does endorse SOB with exertion. Reports that she has not had any exposure to Covid positive patients as she is staying home.  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  ROS: per HPI  Pertinent PMHx: Coronary artery calcification, pulmonary embolism, CKD stage III,  Exam:  Respiratory: speaking full sentences, no increased WOB, some coughing noted  Gen: awake and alert, pink coloration of skin, NAD  Assessment/Plan:  Cough Patient with worsening cough plus lower extremity edema.  Seems consistent with a new heart failure  exacerbation as she is having both cough as well as PND, lower extremity edema.  Unable to assess level of edema as we were having difficulties with technology.  I am unable to give a true diagnosis without physically examining patient.  Would also benefit from a chest x-ray.  I discussed this with Dr. Erin Hearing who is the attending at the time.  We both agree that patient would benefit from being seen and has a low likelihood for Covid as symptoms seem to be progressively worsening with symptoms consistent of heart failure.  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  Advised that patient come in to be seen.  Unfortunately we do not have any appointment times today but was able to get patient in tomorrow and access to care clinic.  Advised that patient get a checks x-ray before this appointment so we may review it at that time.  Have ordered for [facility] imaging and patient states she will go either today or tomorrow morning.  Strict return precautions given.  Advised that if patient has worsening shortness of breath that she should go to the emergency room.  Patient is able to speak to me in full sentences without any shortness of breath or pauses. -Chest x-ray -Appointment tomorrow, will likely need lab work including BNP, BMP.  Recent CBC but can consider repeating if wanting to rule out anemia. -We will need good lung as well as lower extremity exam -Follow-up tomorrow, sooner if worsening. -Advised that if any changes occur between now and appointment tomorrow she  should call the after-hours hotline to seek guidance from a physician -Will forward to Dr. Garlan Fillers who will see patient tomorrow    Time spent during visit with patient: 15 minutes

## 2019-01-05 NOTE — Assessment & Plan Note (Addendum)
Patient with worsening cough plus lower extremity edema.  Seems consistent with a new heart failure exacerbation as she is having both cough as well as PND, lower extremity edema.  Unable to assess level of edema as we were having difficulties with technology.  I am unable to give a true diagnosis without physically examining patient.  Would also benefit from a chest x-ray.  I discussed this with Dr. Erin Hearing who is the attending at the time.  We both agree that patient would benefit from being seen and has a low likelihood for Covid as symptoms seem to be progressively worsening with symptoms consistent of heart failure.  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  Advised that patient come in to be seen.  Unfortunately we do not have any appointment times today but was able to get patient in tomorrow and access to care clinic.  Advised that patient get a checks x-ray before this appointment so we may review it at that time.  Have ordered for [facility] imaging and patient states she will go either today or tomorrow morning.  Strict return precautions given.  Advised that if patient has worsening shortness of breath that she should go to the emergency room.  Patient is able to speak to me in full sentences without any shortness of breath or pauses. -Chest x-ray -Appointment tomorrow, will likely need lab work including BNP, BMP.  Recent CBC but can consider repeating if wanting to rule out anemia. -We will need good lung as well as lower extremity exam -Follow-up tomorrow, sooner if worsening. -Advised that if any changes occur between now and appointment tomorrow she should call the after-hours hotline to seek guidance from a physician -Will forward to Dr. Garlan Fillers who will see patient tomorrow

## 2019-01-06 ENCOUNTER — Other Ambulatory Visit: Payer: Self-pay

## 2019-01-06 ENCOUNTER — Ambulatory Visit (HOSPITAL_COMMUNITY)
Admission: RE | Admit: 2019-01-06 | Discharge: 2019-01-06 | Disposition: A | Discharge status: Home / Self Care | Payer: Medicare Other | Source: Ambulatory Visit | Attending: Family Medicine | Admitting: Family Medicine

## 2019-01-06 ENCOUNTER — Ambulatory Visit: Payer: Medicare Other | Admitting: Cardiovascular Disease

## 2019-01-06 ENCOUNTER — Ambulatory Visit (INDEPENDENT_AMBULATORY_CARE_PROVIDER_SITE_OTHER): Payer: Medicare Other | Admitting: Family Medicine

## 2019-01-06 VITALS — BP 120/72 | HR 83 | Wt 183.2 lb

## 2019-01-06 DIAGNOSIS — I2510 Atherosclerotic heart disease of native coronary artery without angina pectoris: Secondary | ICD-10-CM | POA: Diagnosis not present

## 2019-01-06 DIAGNOSIS — R079 Chest pain, unspecified: Secondary | ICD-10-CM

## 2019-01-06 DIAGNOSIS — I2584 Coronary atherosclerosis due to calcified coronary lesion: Secondary | ICD-10-CM

## 2019-01-06 DIAGNOSIS — R0600 Dyspnea, unspecified: Secondary | ICD-10-CM

## 2019-01-06 DIAGNOSIS — R0609 Other forms of dyspnea: Secondary | ICD-10-CM

## 2019-01-06 NOTE — Telephone Encounter (Signed)
Informed patient that CXR is negative and that she should keep her appointment 01/06/2019 at 1350 per Dr. Tammi Klippel.  .Ozella Almond, [place]

## 2019-01-06 NOTE — Progress Notes (Signed)
     Subjective: Chief Complaint  Patient presents with  . Cough  . Leg Swelling    HPI: [name] is a 68 y.o. presenting to clinic today to discuss the following:  Cough Dry cough over a week with 10lb weight gain and new onset LE bilateral edema +2, now starting to get SOB on exertion walking up steps or "a really long way". No back pain or dysuira, no changes in urination. Her SOB on exertion is new as well as the weight gain. She has not had these problems before. No fever, chills, abdominal pain, nausea, or vomiting. She does endorse some chest discomfort but no burning.  ROS noted in HPI.    Social History   Tobacco Use  Smoking Status Former Smoker  . Packs/day: 0.30  . Types: Cigarettes  . Quit date: 11/01/2011  . Years since quitting: 7.1  Smokeless Tobacco Never Used   Objective: BP 120/72   Pulse 83   Wt 183 lb 3.2 oz (83.1 kg)   SpO2 95%   BMI 28.69 kg/m  Vitals and nursing notes reviewed  Physical Exam Gen: Alert and Oriented x 3, NAD CV: RRR, no murmurs, normal S1, S2 split Resp: CTAB, no wheezing, no crackles, or rhonchi, comfortable work of breathing Ext: no clubbing, cyanosis, +2 bilateral pitting edema Skin: warm, dry, intact, no rashes  Assessment/Plan:  Dyspnea Broad etiology still considered with PNA vs CHF vs deconditioning. Reflux could be playing a role but doesn't explain her increased pitting edema. Given weight gain, +2 bilateral pitting edema, and cough will continue work up for CHF with BNP and echo ordered. I do feel like PNA is less likely given clear CXR and no fever, chills. Will get CBC to ensure no leukocytosis in case CXR is lagging. - F/u labs all normal, patient informed. - EKG normal so cardiac etiology causing symptoms is unlikely   PATIENT EDUCATION PROVIDED: See AVS    Diagnosis and plan along with any newly prescribed medication(s) were discussed in detail with this patient today. The patient verbalized  understanding and agreed with the plan. Patient advised if symptoms worsen return to clinic or ER.    Orders Placed This Encounter  Procedures  . CBC with Differential  . BASIC METABOLIC PANEL WITH GFR  . Brain natriuretic peptide  . Brain natriuretic peptide  . EKG 12-Lead  . ECHOCARDIOGRAM COMPLETE    Standing Status:   Future    Number of Occurrences:   1    Standing Expiration Date:   02/06/2019    Order Specific Question:   Where should this test be performed    Answer:   [place]    Order Specific Question:   Perflutren DEFINITY (image enhancing agent) should be administered unless hypersensitivity or allergy exist    Answer:   Administer Perflutren    Order Specific Question:   Reason for exam-Echo    Answer:   Dyspnea  786.09 / R06.00     Harolyn Rutherford, DO 01/06/2019, 1:57 PM PGY-3 [place]

## 2019-01-06 NOTE — Patient Instructions (Signed)
It was great to meet you today! Thank you for letting me participate in your care!  Today, we discussed your cough and new onset bilateral leg swelling with shortness of breath. I am concerned about your symptoms and will call you in any of your labs are abnormal. I may be in touch to get more testing.  In the meantime, take an extra lasix today and tomorrow and then return to clinic on Monday for your follow up appointment.  Be well, Harolyn Rutherford, DO PGY-3, [facility]

## 2019-01-07 LAB — CBC WITH DIFFERENTIAL/PLATELET
Basophils Absolute: 0.1 10*3/uL (ref 0.0–0.2)
Basos: 1 %
EOS (ABSOLUTE): 0.1 10*3/uL (ref 0.0–0.4)
Eos: 2 %
Hematocrit: 36.3 % (ref 34.0–46.6)
Hemoglobin: 12.2 g/dL (ref 11.1–15.9)
Immature Grans (Abs): 0 10*3/uL (ref 0.0–0.1)
Immature Granulocytes: 0 %
Lymphocytes Absolute: 1.1 10*3/uL (ref 0.7–3.1)
Lymphs: 19 %
MCH: 29 pg (ref 26.6–33.0)
MCHC: 33.6 g/dL (ref 31.5–35.7)
MCV: 86 fL (ref 79–97)
Monocytes Absolute: 0.4 10*3/uL (ref 0.1–0.9)
Monocytes: 7 %
Neutrophils Absolute: 4.2 10*3/uL (ref 1.4–7.0)
Neutrophils: 71 %
Platelets: 208 10*3/uL (ref 150–450)
RBC: 4.2 x10E6/uL (ref 3.77–5.28)
RDW: 13.7 % (ref 11.7–15.4)
WBC: 5.9 10*3/uL (ref 3.4–10.8)

## 2019-01-07 LAB — BRAIN NATRIURETIC PEPTIDE: BNP: 26.6 pg/mL (ref 0.0–100.0)

## 2019-01-10 ENCOUNTER — Ambulatory Visit (HOSPITAL_COMMUNITY)
Admission: RE | Admit: 2019-01-10 | Discharge: 2019-01-10 | Disposition: A | Discharge status: Home / Self Care | Payer: Medicare Other | Source: Ambulatory Visit | Attending: Family Medicine | Admitting: Family Medicine

## 2019-01-10 ENCOUNTER — Ambulatory Visit: Payer: Medicare Other

## 2019-01-10 ENCOUNTER — Other Ambulatory Visit: Payer: Self-pay

## 2019-01-10 ENCOUNTER — Ambulatory Visit (INDEPENDENT_AMBULATORY_CARE_PROVIDER_SITE_OTHER): Payer: Medicare Other | Admitting: Family Medicine

## 2019-01-10 VITALS — BP 122/70 | HR 87 | Ht 67.0 in | Wt 177.4 lb

## 2019-01-10 DIAGNOSIS — R0609 Other forms of dyspnea: Secondary | ICD-10-CM

## 2019-01-10 DIAGNOSIS — I2510 Atherosclerotic heart disease of native coronary artery without angina pectoris: Secondary | ICD-10-CM | POA: Diagnosis not present

## 2019-01-10 DIAGNOSIS — R0600 Dyspnea, unspecified: Secondary | ICD-10-CM | POA: Insufficient documentation

## 2019-01-10 DIAGNOSIS — R05 Cough: Secondary | ICD-10-CM

## 2019-01-10 DIAGNOSIS — I2584 Coronary atherosclerosis due to calcified coronary lesion: Secondary | ICD-10-CM

## 2019-01-10 DIAGNOSIS — K219 Gastro-esophageal reflux disease without esophagitis: Secondary | ICD-10-CM

## 2019-01-10 DIAGNOSIS — R053 Chronic cough: Secondary | ICD-10-CM

## 2019-01-10 MED ORDER — PANTOPRAZOLE SODIUM 40 MG PO TBEC: 40.0000 mg | DELAYED_RELEASE_TABLET | Freq: Two times a day (BID) | ORAL | 3 refills | Status: DC

## 2019-01-10 MED ORDER — PANTOPRAZOLE SODIUM 40 MG PO TBEC: 40.0000 mg | DELAYED_RELEASE_TABLET | Freq: Every day | ORAL | 3 refills | Status: DC

## 2019-01-10 NOTE — Assessment & Plan Note (Addendum)
Patient with chronic cough.  Work-up thus far has been negative for heart failure, although echo is pending.  Patient is not on ACE or an ARB.  Unlikely infectious given the chronicity.  Given worsening GERD type symptoms negative chest x-ray, would favor trialing increased PPI.  Patient does take clopidogrel which can interact with esomeprazole, will switch patient over to Protonix 40 mg twice daily for 2 weeks.  Patient follow-up in 2 weeks if not having any improvement with cough.  If no improvement at that time, would consider trialing antihistamine, decongestant, nasal steroid to rule out postnasal drip and allergic rhinitis.  Precepted with Dr. Erin Hearing.

## 2019-01-10 NOTE — Progress Notes (Signed)
    Subjective:  [name] is a 68 y.o. female who presents to the [facility] today with a chief complaint of chronic cough.   HPI:  Patient presenting with follow-up for chronic cough, dyspnea on exertion.  Patient that she had a cough approximately 3 weeks.  She has been seen in clinic on 01/06/2019 where a work-up was started for possible heart failure.  Patient had a BNP that was grossly normal at 26, BMP is pending, CBC was normal, chest x-ray was negative for acute findings, normal EKG.  Patient said that she had some weight gain but was advised to take increased dose of her Lasix and is now had normalization of her weight.  This is reflected in her chart as well with her weight today at 177 with baseline weight between 175-180.  Patient had echocardiogram this a.m., but is not resulted yet.  Last echocardiogram was in 2019 which did show an EF of 55 to 60%.  Patient endorses having some nasal congestion over the past few days as well.  Denies fevers or chills.  Says that she had worsening GERD symptoms over the past few weeks and needed to take more of her Nexium.  She takes Nexium 20 mg once a day, but sometimes takes it twice a day.  He says it is mostly painful when she coughs, and that she can get winded after walking but predominantly when she is coughing.  Patient denies any infectious symptoms before the start of her cough.  ROS: Per HPI  PMH: GERD,   Objective:  Physical Exam: BP 122/70   Pulse 87   Ht 5\' 7"  (1.702 m)   Wt 177 lb 6 oz (80.5 kg)   SpO2 97%   BMI 27.78 kg/m   Gen: NAD, resting comfortably CV: RRR with no murmurs appreciated, no JVD Pulm: NWOB, CTAB with no crackles, wheezes, or rhonchi MSK: no edema, cyanosis, or clubbing noted Skin: warm, dry Neuro: grossly normal, moves all extremities Psych: Normal affect and thought content  No results found for this or any previous visit (from the past 72 hour(s)).   Assessment/Plan:  Chronic cough Patient with  chronic cough.  Work-up thus far has been negative for heart failure, although echo is pending.  Unlikely infectious given the chronicity.  Given worsening GERD type symptoms negative chest x-ray, would favor trialing increased PPI.  Patient does take clopidogrel which can interact with esomeprazole, will switch patient over to Protonix 40 mg twice daily for 2 weeks.  Patient follow-up in 2 weeks if not having any improvement with cough.  If no improvement at that time, would consider trialing antihistamine, decongestant, nasal steroid to rule out postnasal drip and allergic rhinitis.  Precepted with Dr. Erin Hearing.   Lab Orders  No laboratory test(s) ordered today    Meds ordered this encounter  Medications  . DISCONTD: pantoprazole (PROTONIX) 40 MG tablet    Sig: Take 1 tablet (40 mg total) by mouth daily.    Dispense:  30 tablet    Refill:  3  . pantoprazole (PROTONIX) 40 MG tablet    Sig: Take 1 tablet (40 mg total) by mouth 2 (two) times daily.    Dispense:  30 tablet    Refill:  3      Marny Lowenstein, MD, MS FAMILY MEDICINE RESIDENT - PGY3 01/10/2019 12:02 PM

## 2019-01-10 NOTE — Progress Notes (Signed)
0

## 2019-01-10 NOTE — Progress Notes (Signed)
  Echocardiogram 2D Echocardiogram has been performed.  [name] 01/10/2019, 9:44 AM

## 2019-01-10 NOTE — Patient Instructions (Addendum)
It was a pleasure to see you today! Thank you for choosing [facility] for your primary care. [name] was seen for chronic cough.    Your work-up thus far does not support heart failure or pneumonia for your cough and shortness of breath.  Given the length of time of your cough, we are calling in a chronic cough.  This could be due to multiple things and is more of a trial and error approach at this point.  Right now we are trying a stronger GERD medicine for you, Protonix 40 mg twice a day. Please take this for 2 weeks, if you do not have any improvement please follow-up in clinic.  Best,  Marny Lowenstein, MD, MS FAMILY MEDICINE RESIDENT - PGY3 01/10/2019 11:26 AM

## 2019-01-11 ENCOUNTER — Encounter: Payer: Self-pay | Admitting: Family Medicine

## 2019-01-12 ENCOUNTER — Other Ambulatory Visit: Payer: Self-pay | Admitting: *Deleted

## 2019-01-12 MED ORDER — CLOPIDOGREL BISULFATE 75 MG PO TABS: 75.0000 mg | ORAL_TABLET | Freq: Every day | ORAL | 3 refills | Status: DC

## 2019-01-12 NOTE — Assessment & Plan Note (Addendum)
Broad etiology still considered with PNA vs CHF vs deconditioning. Reflux could be playing a role but doesn't explain her increased pitting edema. Given weight gain, +2 bilateral pitting edema, and cough will continue work up for CHF with BNP and echo ordered. I do feel like PNA is less likely given clear CXR and no fever, chills. Will get CBC to ensure no leukocytosis in case CXR is lagging. - F/u labs all normal, patient informed. - EKG normal so cardiac etiology causing symptoms is unlikely

## 2019-02-09 ENCOUNTER — Telehealth: Payer: Self-pay | Admitting: *Deleted

## 2019-02-09 NOTE — Telephone Encounter (Signed)
Rx request for sulfameth/trimethoprim 800/160mg . Not on med list. Please advise. Osmar Howton Kennon Holter, CMA

## 2019-02-10 NOTE — Telephone Encounter (Signed)
Can you please contact patient to see what she needs it for?

## 2019-02-13 ENCOUNTER — Other Ambulatory Visit: Payer: Self-pay | Admitting: Neurology

## 2019-02-14 NOTE — Telephone Encounter (Signed)
2nd request. Aaronjames Kelsay,CMA  

## 2019-02-16 NOTE — Telephone Encounter (Signed)
Please contact patient to see what this prescription is for and if needs refill. Thank you.

## 2019-02-17 ENCOUNTER — Other Ambulatory Visit: Payer: Self-pay | Admitting: Family Medicine

## 2019-02-18 NOTE — Telephone Encounter (Signed)
Pt lm on nurse line.    LMOVM for callback. Christen Bame, CMA

## 2019-02-18 NOTE — Telephone Encounter (Signed)
LMOVM for patient to return phone call regarding medication refill. [name], CMA

## 2019-02-21 NOTE — Telephone Encounter (Signed)
Patient calls nurse line. Bactrim was requested by her pharmacy, not her. She does not need this medication.

## 2019-03-27 ENCOUNTER — Other Ambulatory Visit: Payer: Self-pay | Admitting: Student in an Organized Health Care Education/Training Program

## 2019-04-04 ENCOUNTER — Other Ambulatory Visit: Payer: Self-pay | Admitting: Adult Health

## 2019-04-05 DIAGNOSIS — N390 Urinary tract infection, site not specified: Secondary | ICD-10-CM | POA: Diagnosis not present

## 2019-04-05 DIAGNOSIS — I129 Hypertensive chronic kidney disease with stage 1 through stage 4 chronic kidney disease, or unspecified chronic kidney disease: Secondary | ICD-10-CM | POA: Diagnosis not present

## 2019-04-05 DIAGNOSIS — D631 Anemia in chronic kidney disease: Secondary | ICD-10-CM | POA: Diagnosis not present

## 2019-04-05 DIAGNOSIS — N2581 Secondary hyperparathyroidism of renal origin: Secondary | ICD-10-CM | POA: Diagnosis not present

## 2019-04-05 DIAGNOSIS — E785 Hyperlipidemia, unspecified: Secondary | ICD-10-CM | POA: Diagnosis not present

## 2019-04-05 DIAGNOSIS — N189 Chronic kidney disease, unspecified: Secondary | ICD-10-CM | POA: Diagnosis not present

## 2019-04-05 DIAGNOSIS — N183 Chronic kidney disease, stage 3 unspecified: Secondary | ICD-10-CM | POA: Diagnosis not present

## 2019-04-10 ENCOUNTER — Other Ambulatory Visit: Payer: Self-pay | Admitting: Family Medicine

## 2019-04-10 DIAGNOSIS — R05 Cough: Secondary | ICD-10-CM

## 2019-04-10 DIAGNOSIS — R053 Chronic cough: Secondary | ICD-10-CM

## 2019-04-10 DIAGNOSIS — K219 Gastro-esophageal reflux disease without esophagitis: Secondary | ICD-10-CM

## 2019-04-11 ENCOUNTER — Other Ambulatory Visit: Payer: Self-pay | Admitting: Student in an Organized Health Care Education/Training Program

## 2019-04-11 DIAGNOSIS — R05 Cough: Secondary | ICD-10-CM

## 2019-04-11 DIAGNOSIS — R053 Chronic cough: Secondary | ICD-10-CM

## 2019-04-11 DIAGNOSIS — K219 Gastro-esophageal reflux disease without esophagitis: Secondary | ICD-10-CM

## 2019-04-22 ENCOUNTER — Other Ambulatory Visit: Payer: Self-pay | Admitting: Adult Health

## 2019-04-26 NOTE — Telephone Encounter (Signed)
Wanted to check to confirm that zonegran is 150mg  po bid.  I called pt and she take the 100mg  po bid, and the 25mg  caps as needed.  She stated that both MM/NP and you are aware of this.  She only takes 3 times out fo month, dependent on her trigger.(weather makes thing worse and will take more often).

## 2019-04-29 ENCOUNTER — Other Ambulatory Visit: Payer: Self-pay | Admitting: Student in an Organized Health Care Education/Training Program

## 2019-05-05 DIAGNOSIS — M25562 Pain in left knee: Secondary | ICD-10-CM | POA: Diagnosis not present

## 2019-05-05 DIAGNOSIS — M25552 Pain in left hip: Secondary | ICD-10-CM | POA: Diagnosis not present

## 2019-05-06 DIAGNOSIS — M25552 Pain in left hip: Secondary | ICD-10-CM | POA: Diagnosis not present

## 2019-05-06 DIAGNOSIS — M25512 Pain in left shoulder: Secondary | ICD-10-CM | POA: Diagnosis not present

## 2019-05-06 DIAGNOSIS — M25522 Pain in left elbow: Secondary | ICD-10-CM | POA: Diagnosis not present

## 2019-05-06 DIAGNOSIS — S0093XA Contusion of unspecified part of head, initial encounter: Secondary | ICD-10-CM | POA: Diagnosis not present

## 2019-05-09 ENCOUNTER — Other Ambulatory Visit: Payer: Self-pay | Admitting: Orthopedic Surgery

## 2019-05-09 ENCOUNTER — Ambulatory Visit
Admission: RE | Admit: 2019-05-09 | Discharge: 2019-05-09 | Disposition: A | Discharge status: Home / Self Care | Payer: Medicare Other | Source: Ambulatory Visit | Attending: Orthopedic Surgery | Admitting: Orthopedic Surgery

## 2019-05-09 ENCOUNTER — Other Ambulatory Visit: Payer: Self-pay | Admitting: Surgical

## 2019-05-09 ENCOUNTER — Other Ambulatory Visit: Payer: Self-pay

## 2019-05-09 DIAGNOSIS — R519 Headache, unspecified: Secondary | ICD-10-CM | POA: Diagnosis not present

## 2019-05-09 DIAGNOSIS — S0093XA Contusion of unspecified part of head, initial encounter: Secondary | ICD-10-CM

## 2019-05-09 DIAGNOSIS — S0990XA Unspecified injury of head, initial encounter: Secondary | ICD-10-CM | POA: Diagnosis not present

## 2019-05-14 ENCOUNTER — Emergency Department (HOSPITAL_COMMUNITY): Payer: Medicare Other

## 2019-05-14 ENCOUNTER — Inpatient Hospital Stay (HOSPITAL_COMMUNITY)
Admission: EM | Admit: 2019-05-14 | Discharge: 2019-05-23 | DRG: 552 | Disposition: A | Discharge status: Rehabilitation Facility | Payer: Medicare Other | Attending: Family Medicine | Admitting: Family Medicine

## 2019-05-14 DIAGNOSIS — R29898 Other symptoms and signs involving the musculoskeletal system: Secondary | ICD-10-CM

## 2019-05-14 DIAGNOSIS — I129 Hypertensive chronic kidney disease with stage 1 through stage 4 chronic kidney disease, or unspecified chronic kidney disease: Secondary | ICD-10-CM | POA: Diagnosis present

## 2019-05-14 DIAGNOSIS — R0902 Hypoxemia: Secondary | ICD-10-CM | POA: Diagnosis not present

## 2019-05-14 DIAGNOSIS — S199XXA Unspecified injury of neck, initial encounter: Secondary | ICD-10-CM | POA: Diagnosis not present

## 2019-05-14 DIAGNOSIS — Z66 Do not resuscitate: Secondary | ICD-10-CM | POA: Diagnosis present

## 2019-05-14 DIAGNOSIS — E785 Hyperlipidemia, unspecified: Secondary | ICD-10-CM | POA: Diagnosis present

## 2019-05-14 DIAGNOSIS — D649 Anemia, unspecified: Secondary | ICD-10-CM | POA: Diagnosis present

## 2019-05-14 DIAGNOSIS — S32512A Fracture of superior rim of left pubis, initial encounter for closed fracture: Secondary | ICD-10-CM | POA: Diagnosis present

## 2019-05-14 DIAGNOSIS — Y929 Unspecified place or not applicable: Secondary | ICD-10-CM | POA: Diagnosis not present

## 2019-05-14 DIAGNOSIS — G629 Polyneuropathy, unspecified: Secondary | ICD-10-CM | POA: Diagnosis present

## 2019-05-14 DIAGNOSIS — S32592D Other specified fracture of left pubis, subsequent encounter for fracture with routine healing: Secondary | ICD-10-CM | POA: Diagnosis not present

## 2019-05-14 DIAGNOSIS — I959 Hypotension, unspecified: Secondary | ICD-10-CM | POA: Diagnosis not present

## 2019-05-14 DIAGNOSIS — S3210XA Unspecified fracture of sacrum, initial encounter for closed fracture: Principal | ICD-10-CM | POA: Diagnosis present

## 2019-05-14 DIAGNOSIS — K5900 Constipation, unspecified: Secondary | ICD-10-CM | POA: Diagnosis not present

## 2019-05-14 DIAGNOSIS — R296 Repeated falls: Secondary | ICD-10-CM | POA: Diagnosis not present

## 2019-05-14 DIAGNOSIS — G43909 Migraine, unspecified, not intractable, without status migrainosus: Secondary | ICD-10-CM | POA: Diagnosis present

## 2019-05-14 DIAGNOSIS — R509 Fever, unspecified: Secondary | ICD-10-CM | POA: Diagnosis not present

## 2019-05-14 DIAGNOSIS — N189 Chronic kidney disease, unspecified: Secondary | ICD-10-CM | POA: Diagnosis not present

## 2019-05-14 DIAGNOSIS — D72829 Elevated white blood cell count, unspecified: Secondary | ICD-10-CM | POA: Diagnosis present

## 2019-05-14 DIAGNOSIS — Z8673 Personal history of transient ischemic attack (TIA), and cerebral infarction without residual deficits: Secondary | ICD-10-CM | POA: Diagnosis not present

## 2019-05-14 DIAGNOSIS — S329XXA Fracture of unspecified parts of lumbosacral spine and pelvis, initial encounter for closed fracture: Secondary | ICD-10-CM | POA: Diagnosis not present

## 2019-05-14 DIAGNOSIS — M7989 Other specified soft tissue disorders: Secondary | ICD-10-CM | POA: Diagnosis not present

## 2019-05-14 DIAGNOSIS — Z86711 Personal history of pulmonary embolism: Secondary | ICD-10-CM | POA: Diagnosis not present

## 2019-05-14 DIAGNOSIS — M48061 Spinal stenosis, lumbar region without neurogenic claudication: Secondary | ICD-10-CM | POA: Diagnosis present

## 2019-05-14 DIAGNOSIS — N1831 Chronic kidney disease, stage 3a: Secondary | ICD-10-CM | POA: Diagnosis present

## 2019-05-14 DIAGNOSIS — R7989 Other specified abnormal findings of blood chemistry: Secondary | ICD-10-CM | POA: Diagnosis not present

## 2019-05-14 DIAGNOSIS — G588 Other specified mononeuropathies: Secondary | ICD-10-CM | POA: Diagnosis present

## 2019-05-14 DIAGNOSIS — Z20822 Contact with and (suspected) exposure to covid-19: Secondary | ICD-10-CM | POA: Diagnosis present

## 2019-05-14 DIAGNOSIS — K5901 Slow transit constipation: Secondary | ICD-10-CM | POA: Diagnosis not present

## 2019-05-14 DIAGNOSIS — K219 Gastro-esophageal reflux disease without esophagitis: Secondary | ICD-10-CM | POA: Diagnosis present

## 2019-05-14 DIAGNOSIS — W1830XA Fall on same level, unspecified, initial encounter: Secondary | ICD-10-CM | POA: Diagnosis present

## 2019-05-14 DIAGNOSIS — S32810S Multiple fractures of pelvis with stable disruption of pelvic ring, sequela: Secondary | ICD-10-CM | POA: Diagnosis not present

## 2019-05-14 DIAGNOSIS — S32810D Multiple fractures of pelvis with stable disruption of pelvic ring, subsequent encounter for fracture with routine healing: Secondary | ICD-10-CM | POA: Diagnosis not present

## 2019-05-14 DIAGNOSIS — Z9181 History of falling: Secondary | ICD-10-CM | POA: Diagnosis not present

## 2019-05-14 DIAGNOSIS — Z87891 Personal history of nicotine dependence: Secondary | ICD-10-CM | POA: Diagnosis not present

## 2019-05-14 DIAGNOSIS — S32592A Other specified fracture of left pubis, initial encounter for closed fracture: Secondary | ICD-10-CM | POA: Diagnosis not present

## 2019-05-14 DIAGNOSIS — F419 Anxiety disorder, unspecified: Secondary | ICD-10-CM | POA: Diagnosis present

## 2019-05-14 DIAGNOSIS — S40029A Contusion of unspecified upper arm, initial encounter: Secondary | ICD-10-CM | POA: Diagnosis present

## 2019-05-14 DIAGNOSIS — S32502D Unspecified fracture of left pubis, subsequent encounter for fracture with routine healing: Secondary | ICD-10-CM | POA: Diagnosis not present

## 2019-05-14 DIAGNOSIS — R9431 Abnormal electrocardiogram [ECG] [EKG]: Secondary | ICD-10-CM | POA: Diagnosis not present

## 2019-05-14 DIAGNOSIS — S3210XD Unspecified fracture of sacrum, subsequent encounter for fracture with routine healing: Secondary | ICD-10-CM | POA: Diagnosis not present

## 2019-05-14 DIAGNOSIS — S3282XS Multiple fractures of pelvis without disruption of pelvic ring, sequela: Secondary | ICD-10-CM | POA: Diagnosis not present

## 2019-05-14 DIAGNOSIS — Z79899 Other long term (current) drug therapy: Secondary | ICD-10-CM

## 2019-05-14 DIAGNOSIS — Z90710 Acquired absence of both cervix and uterus: Secondary | ICD-10-CM | POA: Diagnosis not present

## 2019-05-14 DIAGNOSIS — W19XXXA Unspecified fall, initial encounter: Secondary | ICD-10-CM | POA: Diagnosis not present

## 2019-05-14 DIAGNOSIS — S0990XA Unspecified injury of head, initial encounter: Secondary | ICD-10-CM | POA: Diagnosis not present

## 2019-05-14 DIAGNOSIS — N179 Acute kidney failure, unspecified: Secondary | ICD-10-CM | POA: Diagnosis present

## 2019-05-14 DIAGNOSIS — Z03818 Encounter for observation for suspected exposure to other biological agents ruled out: Secondary | ICD-10-CM | POA: Diagnosis not present

## 2019-05-14 DIAGNOSIS — W1830XD Fall on same level, unspecified, subsequent encounter: Secondary | ICD-10-CM | POA: Diagnosis not present

## 2019-05-14 DIAGNOSIS — Z981 Arthrodesis status: Secondary | ICD-10-CM | POA: Diagnosis not present

## 2019-05-14 DIAGNOSIS — S32402D Unspecified fracture of left acetabulum, subsequent encounter for fracture with routine healing: Secondary | ICD-10-CM | POA: Diagnosis not present

## 2019-05-14 DIAGNOSIS — S32415D Nondisplaced fracture of anterior wall of left acetabulum, subsequent encounter for fracture with routine healing: Secondary | ICD-10-CM | POA: Diagnosis not present

## 2019-05-14 DIAGNOSIS — M25552 Pain in left hip: Secondary | ICD-10-CM | POA: Diagnosis not present

## 2019-05-14 DIAGNOSIS — R5381 Other malaise: Secondary | ICD-10-CM | POA: Diagnosis not present

## 2019-05-14 DIAGNOSIS — Z7902 Long term (current) use of antithrombotics/antiplatelets: Secondary | ICD-10-CM

## 2019-05-14 DIAGNOSIS — R52 Pain, unspecified: Secondary | ICD-10-CM | POA: Diagnosis not present

## 2019-05-14 DIAGNOSIS — M5417 Radiculopathy, lumbosacral region: Secondary | ICD-10-CM | POA: Diagnosis not present

## 2019-05-14 HISTORY — DX: Anemia, unspecified: D64.9

## 2019-05-14 HISTORY — DX: Personal history of transient ischemic attack (TIA), and cerebral infarction without residual deficits: Z86.73

## 2019-05-14 HISTORY — DX: Essential (primary) hypertension: I10

## 2019-05-14 HISTORY — DX: Hyperlipidemia, unspecified: E78.5

## 2019-05-14 HISTORY — DX: Radiculopathy, cervical region: M54.12

## 2019-05-14 HISTORY — DX: Migraine, unspecified, not intractable, without status migrainosus: G43.909

## 2019-05-14 LAB — BASIC METABOLIC PANEL
Anion gap: 9 (ref 5–15)
BUN: 20 mg/dL (ref 8–23)
CO2: 24 mmol/L (ref 22–32)
Calcium: 8.4 mg/dL — ABNORMAL LOW (ref 8.9–10.3)
Chloride: 105 mmol/L (ref 98–111)
Creatinine, Ser: 1.1 mg/dL — ABNORMAL HIGH (ref 0.44–1.00)
GFR calc Af Amer: >60 mL/min (ref >60)
GFR calc non Af Amer: 52 mL/min — ABNORMAL LOW (ref >60)
Glucose, Bld: 129 mg/dL — ABNORMAL HIGH (ref 70–99)
Potassium: 4.1 mmol/L (ref 3.5–5.1)
Sodium: 138 mmol/L (ref 135–145)

## 2019-05-14 LAB — CBC WITH DIFFERENTIAL/PLATELET
Abs Immature Granulocytes: 0.16 10*3/uL — ABNORMAL HIGH (ref 0.00–0.07)
Basophils Absolute: 0 10*3/uL (ref 0.0–0.1)
Basophils Relative: 1 %
Eosinophils Absolute: 0.1 10*3/uL (ref 0.0–0.5)
Eosinophils Relative: 2 %
HCT: 35.1 % — ABNORMAL LOW (ref 36.0–46.0)
Hemoglobin: 10.9 g/dL — ABNORMAL LOW (ref 12.0–15.0)
Immature Granulocytes: 2 %
Lymphocytes Relative: 9 %
Lymphs Abs: 0.8 10*3/uL (ref 0.7–4.0)
MCH: 28 pg (ref 26.0–34.0)
MCHC: 31.1 g/dL (ref 30.0–36.0)
MCV: 90.2 fL (ref 80.0–100.0)
Monocytes Absolute: 0.5 10*3/uL (ref 0.1–1.0)
Monocytes Relative: 6 %
Neutro Abs: 7.2 10*3/uL (ref 1.7–7.7)
Neutrophils Relative %: 80 %
Platelets: 285 10*3/uL (ref 150–400)
RBC: 3.89 MIL/uL (ref 3.87–5.11)
RDW: 14.7 % (ref 11.5–15.5)
WBC: 8.8 10*3/uL (ref 4.0–10.5)
nRBC: 0 % (ref 0.0–0.2)

## 2019-05-14 MED ORDER — ENOXAPARIN SODIUM 40 MG/0.4ML ~~LOC~~ SOLN
40.0000 mg | SUBCUTANEOUS | Status: DC
  Administered 2019-05-14 – 2019-05-22 (×9): 40 mg via SUBCUTANEOUS
  Filled 2019-05-14 (×9): qty 0.4

## 2019-05-14 MED ORDER — FENTANYL CITRATE (PF) 100 MCG/2ML IJ SOLN
INTRAMUSCULAR | Status: AC
  Filled 2019-05-14: qty 2

## 2019-05-14 MED ORDER — OXYCODONE-ACETAMINOPHEN 5-325 MG PO TABS
2.0000 | ORAL_TABLET | Freq: Once | ORAL | Status: AC
  Administered 2019-05-14: 2 via ORAL
  Filled 2019-05-14: qty 2

## 2019-05-14 MED ORDER — FENTANYL CITRATE (PF) 100 MCG/2ML IJ SOLN
50.0000 ug | Freq: Once | INTRAMUSCULAR | Status: AC
  Administered 2019-05-14: 50 ug via INTRAVENOUS

## 2019-05-14 MED ORDER — ACETAMINOPHEN 325 MG PO TABS: 650.0000 mg | ORAL_TABLET | Freq: Four times a day (QID) | ORAL | Status: DC | PRN

## 2019-05-14 MED ORDER — ACETAMINOPHEN 650 MG RE SUPP: 650.0000 mg | Freq: Four times a day (QID) | RECTAL | Status: DC | PRN

## 2019-05-14 MED ORDER — MORPHINE SULFATE (PF) 4 MG/ML IV SOLN
4.0000 mg | Freq: Once | INTRAVENOUS | Status: AC
  Administered 2019-05-14: 4 mg via INTRAVENOUS
  Filled 2019-05-14: qty 1

## 2019-05-14 MED ORDER — HYDROCODONE-ACETAMINOPHEN 5-325 MG PO TABS: 1.0000 | ORAL_TABLET | Freq: Four times a day (QID) | ORAL | 0 refills | Status: DC | PRN

## 2019-05-14 MED ORDER — CLOPIDOGREL BISULFATE 75 MG PO TABS
75.0000 mg | ORAL_TABLET | Freq: Every day | ORAL | Status: DC
  Administered 2019-05-15 – 2019-05-23 (×9): 75 mg via ORAL
  Filled 2019-05-14 (×9): qty 1

## 2019-05-14 MED ORDER — RAMIPRIL 2.5 MG PO CAPS
2.5000 mg | ORAL_CAPSULE | Freq: Every day | ORAL | Status: DC
  Administered 2019-05-15: 2.5 mg via ORAL
  Filled 2019-05-14 (×3): qty 1

## 2019-05-14 MED ORDER — HYDROCODONE-ACETAMINOPHEN 5-325 MG PO TABS
1.0000 | ORAL_TABLET | Freq: Three times a day (TID) | ORAL | Status: DC | PRN
  Administered 2019-05-14: 23:00:00 1 via ORAL
  Filled 2019-05-14: qty 1

## 2019-05-14 MED ORDER — PROPRANOLOL HCL 10 MG PO TABS
10.0000 mg | ORAL_TABLET | Freq: Two times a day (BID) | ORAL | Status: DC
  Administered 2019-05-15 – 2019-05-23 (×17): 10 mg via ORAL
  Filled 2019-05-14 (×19): qty 1

## 2019-05-14 MED ORDER — PANTOPRAZOLE SODIUM 40 MG PO TBEC
40.0000 mg | DELAYED_RELEASE_TABLET | Freq: Every day | ORAL | Status: DC
  Administered 2019-05-15 – 2019-05-23 (×9): 40 mg via ORAL
  Filled 2019-05-14 (×9): qty 1

## 2019-05-14 MED ORDER — ZONISAMIDE 100 MG PO CAPS
100.0000 mg | ORAL_CAPSULE | Freq: Two times a day (BID) | ORAL | Status: DC
  Administered 2019-05-14 – 2019-05-23 (×18): 100 mg via ORAL
  Filled 2019-05-14 (×19): qty 1

## 2019-05-14 MED ORDER — MORPHINE SULFATE (PF) 4 MG/ML IV SOLN
4.0000 mg | Freq: Once | INTRAVENOUS | Status: AC
  Administered 2019-05-14: 15:00:00 4 mg via INTRAVENOUS
  Filled 2019-05-14: qty 1

## 2019-05-14 MED ORDER — FUROSEMIDE 40 MG PO TABS
40.0000 mg | ORAL_TABLET | Freq: Every day | ORAL | Status: DC
  Administered 2019-05-15 – 2019-05-20 (×6): 40 mg via ORAL
  Filled 2019-05-14 (×6): qty 1

## 2019-05-14 MED ORDER — DICLOFENAC SODIUM 1 % EX GEL
4.0000 g | Freq: Once | CUTANEOUS | Status: AC
  Administered 2019-05-14: 16:00:00 4 g via TOPICAL
  Filled 2019-05-14: qty 100

## 2019-05-14 MED ORDER — SERTRALINE HCL 100 MG PO TABS
100.0000 mg | ORAL_TABLET | Freq: Every day | ORAL | Status: DC
  Administered 2019-05-15 – 2019-05-23 (×9): 100 mg via ORAL
  Filled 2019-05-14 (×10): qty 1

## 2019-05-14 MED ORDER — ONDANSETRON HCL 4 MG/2ML IJ SOLN
INTRAMUSCULAR | Status: AC
  Filled 2019-05-14: qty 2

## 2019-05-14 MED ORDER — ONDANSETRON HCL 4 MG/2ML IJ SOLN
4.0000 mg | Freq: Once | INTRAMUSCULAR | Status: AC
  Administered 2019-05-14: 4 mg via INTRAVENOUS

## 2019-05-14 MED ORDER — GABAPENTIN 300 MG PO CAPS
600.0000 mg | ORAL_CAPSULE | Freq: Three times a day (TID) | ORAL | Status: DC
  Administered 2019-05-14 – 2019-05-18 (×11): 600 mg via ORAL
  Filled 2019-05-14 (×11): qty 2

## 2019-05-14 NOTE — ED Provider Notes (Addendum)
Patient CARE signed out to reevaluate and discharge with home health needs for pain control.  Patient to follow few weeks back and has pelvic fractures.  Patient had a fall secondary to pain today.  On reassessment pain improved until she tried to ambulate.  Face-to-face order for home health and rehab and further assistance placed.  Social work consult placed.  Pain meds given the emergency room.  Patient unable to ambulate due to pain.  I am hopeful that social work will build to assist, nursing trying to contact them.  Try to contact significant other however no response on 2 phone calls.  Appreciate social work assisting to get patient a wheelchair and close follow-up.  [facility] called for ride home. Nursing staff attempted to ambulate however patient still having severe pain and unable to ambulate.  Discussed with patient's husband and he is extremely uncomfortable with the patient coming home as she cannot get to the bathroom, urinates all over the floor, severe pain and unable to take a couple steps so even if she had a wheelchair she would be able to get into it and he is elderly so he is not able to help her.  Paged family medicine for admission for further treatment.  Plan to update social work.      Courtney Morrison, MD 05/16/19 2320

## 2019-05-14 NOTE — ED Notes (Signed)
Pt returned from radiology, pt medicated for pain/nausea

## 2019-05-14 NOTE — ED Notes (Signed)
To x-ray

## 2019-05-14 NOTE — Progress Notes (Signed)
I discussed DNR status with the patient. Patient states wish to not be intubated and not receive cpr if that was deemed necessary. Will update as dnr/dni in the chart.  Guadalupe Dawn MD PGY-3 Family Medicine Resident

## 2019-05-14 NOTE — ED Triage Notes (Addendum)
Pt transported from home by [facility] for a fall while ambulating to the bathroom, pt states pain from previous fall causes her legs not to be able to move correctly. Pt states she lost her balance and fell against cabinet hit Lside of head and slid down to floor.  Pt c/o L hip/back pain.  No head trauma noted, pt is on Plavix. #20 L hand placed by EMS.  Per EMS pt dx with hip and pelvic fx after fall last week, pt also had fall 2 weeks ago. Pt states she does use cane and walker. Pt reports she was prescribed Hydrocodone yesterday, last dose 4am today.

## 2019-05-14 NOTE — ED Provider Notes (Signed)
[name] Provider Note   CSN: [id number] Arrival date & time: 05/14/19  1210     History Chief Complaint  Patient presents with  . Fall    [name] is a 69 y.o. female.  Patient is a 69 year old female who is currently taking Plavix and reports a fall 2 weeks ago with a fractured left hip being cared for by Dr. Wynelle Link presenting today after a fall at home and hitting her head on the wall.  Patient was a level 2 trauma due to being on anticoagulation and hitting her head.  This occurred approximately 1 hour prior to arrival.  Patient states 2 weeks ago she had an accident causing her to fall onto her left hip.  She was seen by orthopedics and told she had a pelvic fracture.  Patient states she has been in a severe amount of pain for the last few weeks and they just called her in some hydrocodone yesterday and she is taken 2 doses however it is extremely painful for her to bear weight.  She was on the couch today and was walking to the bathroom but when she got to the bathroom she was in a lot of pain and she lost her balance falling backward hitting her head on the side of the cabinet.  She denies any loss of consciousness said there was some mild soreness on her head and some minimal neck tenderness.  She is still having 20 out of 10 pain in her left hip which has been unchanged.  She denies any chest pain, shortness of breath, dizziness, abdominal pain or vomiting.    The history is provided by the patient.  Fall This is a new problem.       No past medical history on file.  There are no problems to display for this patient.      OB History   No obstetric history on file.     No family history on file.  Social History   Tobacco Use  . Smoking status: Not on file  Substance Use Topics  . Alcohol use: Not on file  . Drug use: Not on file    Home Medications Prior to Admission medications   Not on File    Allergies     Patient has no allergy information on record.  Review of Systems   Review of Systems  All other systems reviewed and are negative.   Physical Exam Updated Vital Signs BP (!) 160/72   Pulse 79   Temp 97.8 F (36.6 C) (Tympanic)   Resp (!) 22   Ht 5\' 7"  (1.702 m)   Wt 81.8 kg   SpO2 94%   BMI 28.24 kg/m   Physical Exam Vitals and nursing note reviewed.  Constitutional:      General: She is not in acute distress.    Appearance: Normal appearance. She is well-developed and normal weight.  HENT:     Head: Normocephalic and atraumatic.      Mouth/Throat:     Mouth: Mucous membranes are moist.  Eyes:     Pupils: Pupils are equal, round, and reactive to light.  Neck:   Cardiovascular:     Rate and Rhythm: Normal rate and regular rhythm.     Heart sounds: Normal heart sounds. No murmur. No friction rub.  Pulmonary:     Effort: Pulmonary effort is normal.     Breath sounds: Normal breath sounds. No wheezing or rales.  Abdominal:     General: Bowel sounds are normal. There is no distension.     Palpations: Abdomen is soft.     Tenderness: There is no abdominal tenderness. There is no guarding or rebound.  Musculoskeletal:        General: Tenderness present.     Cervical back: Normal range of motion. Spinous process tenderness present. No muscular tenderness. Normal range of motion.     Left hip: Tenderness and bony tenderness present. Decreased range of motion.       Legs:     Comments: No edema  Skin:    General: Skin is warm and dry.     Findings: No rash.  Neurological:     General: No focal deficit present.     Mental Status: She is alert and oriented to person, place, and time.     GCS: GCS eye subscore is 4. GCS verbal subscore is 5. GCS motor subscore is 6.     Cranial Nerves: No cranial nerve deficit.     Sensory: No sensory deficit.     Motor: No weakness.  Psychiatric:        Mood and Affect: Mood normal.        Behavior: Behavior normal.         Thought Content: Thought content normal.     ED Results / Procedures / Treatments   Labs (all labs ordered are listed, but only abnormal results are displayed) Labs Reviewed  CBC WITH DIFFERENTIAL/PLATELET - Abnormal; Notable for the following components:      Result Value   Hemoglobin 10.9 (*)    HCT 35.1 (*)    Abs Immature Granulocytes 0.16 (*)    All other components within normal limits  BASIC METABOLIC PANEL - Abnormal; Notable for the following components:   Glucose, Bld 129 (*)    Creatinine, Ser 1.10 (*)    Calcium 8.4 (*)    GFR calc non Af Amer 52 (*)    All other components within normal limits    EKG None  Radiology CT HEAD WO CONTRAST  Result Date: 05/14/2019 CLINICAL DATA:  69 year old who fell while walking to the bathroom at home, striking the LEFT side of her head on a cabinet. Initial encounter. Patient is currently anticoagulated. EXAM: CT HEAD WITHOUT CONTRAST CT CERVICAL SPINE WITHOUT CONTRAST TECHNIQUE: Multidetector CT imaging of the head and cervical spine was performed following the standard protocol without intravenous contrast. Multiplanar CT image reconstructions of the cervical spine were also generated. COMPARISON:  CT head 05/09/2019. No prior cervical spine CT. FINDINGS: CT HEAD FINDINGS Brain: Ventricular system normal in size and appearance for age. No mass lesion. No midline shift. No acute hemorrhage or hematoma. No extra-axial fluid collections. No evidence of acute infarction. No focal brain parenchymal abnormalities. Vascular: Mild BILATERAL carotid siphon atherosclerosis. No hyperdense vessel. Skull: No skull fracture or other focal osseous abnormality involving the skull. Sinuses/Orbits: Visualized paranasal sinuses, bilateral mastoid air cells and bilateral middle ear cavities well-aerated. Visualized orbits and globes normal in appearance. Other: None. CT CERVICAL SPINE FINDINGS Alignment: Anatomic posterior alignment. Facet joints anatomically  aligned throughout with diffuse degenerative changes. Skull base and vertebrae: No fractures identified involving the cervical spine. Prior ACDF at C4-5 with a solid-appearing fusion. Apparent prior C5-6 and C6-7 interbody fusion, though the fusion at both levels is incomplete. Osseous demineralization. Coronal reformatted images demonstrate an intact craniocervical junction, intact dens and intact lateral masses throughout. Soft tissues and spinal canal: No  paraspinous or canal hematoma. Mild multifactorial spinal stenosis at C6. Disc levels: Mild to moderate disc space narrowing at C2-3 and C3-4. Upper chest: Mild atherosclerosis involving the proximal great vessels. Visualized lung apices clear. Other: Moderate BILATERAL cervical carotid atherosclerosis. IMPRESSION: 1. No acute intracranial abnormality. 2. No cervical spine fractures identified. 3. Prior ACDF at C4-5 with a solid-appearing fusion. Apparent prior C5-6 and C6-7 interbody fusion, though the fusion at both levels is incomplete. Electronically Signed   By: Evangeline Dakin M.D.   On: 05/14/2019 13:06   CT Cervical Spine Wo Contrast  Result Date: 05/14/2019 CLINICAL DATA:  69 year old who fell while walking to the bathroom at home, striking the LEFT side of her head on a cabinet. Initial encounter. Patient is currently anticoagulated. EXAM: CT HEAD WITHOUT CONTRAST CT CERVICAL SPINE WITHOUT CONTRAST TECHNIQUE: Multidetector CT imaging of the head and cervical spine was performed following the standard protocol without intravenous contrast. Multiplanar CT image reconstructions of the cervical spine were also generated. COMPARISON:  CT head 05/09/2019. No prior cervical spine CT. FINDINGS: CT HEAD FINDINGS Brain: Ventricular system normal in size and appearance for age. No mass lesion. No midline shift. No acute hemorrhage or hematoma. No extra-axial fluid collections. No evidence of acute infarction. No focal brain parenchymal abnormalities.  Vascular: Mild BILATERAL carotid siphon atherosclerosis. No hyperdense vessel. Skull: No skull fracture or other focal osseous abnormality involving the skull. Sinuses/Orbits: Visualized paranasal sinuses, bilateral mastoid air cells and bilateral middle ear cavities well-aerated. Visualized orbits and globes normal in appearance. Other: None. CT CERVICAL SPINE FINDINGS Alignment: Anatomic posterior alignment. Facet joints anatomically aligned throughout with diffuse degenerative changes. Skull base and vertebrae: No fractures identified involving the cervical spine. Prior ACDF at C4-5 with a solid-appearing fusion. Apparent prior C5-6 and C6-7 interbody fusion, though the fusion at both levels is incomplete. Osseous demineralization. Coronal reformatted images demonstrate an intact craniocervical junction, intact dens and intact lateral masses throughout. Soft tissues and spinal canal: No paraspinous or canal hematoma. Mild multifactorial spinal stenosis at C6. Disc levels: Mild to moderate disc space narrowing at C2-3 and C3-4. Upper chest: Mild atherosclerosis involving the proximal great vessels. Visualized lung apices clear. Other: Moderate BILATERAL cervical carotid atherosclerosis. IMPRESSION: 1. No acute intracranial abnormality. 2. No cervical spine fractures identified. 3. Prior ACDF at C4-5 with a solid-appearing fusion. Apparent prior C5-6 and C6-7 interbody fusion, though the fusion at both levels is incomplete. Electronically Signed   By: Evangeline Dakin M.D.   On: 05/14/2019 13:06   DG Hip Unilat W or Wo Pelvis 2-3 Views Left  Result Date: 05/14/2019 CLINICAL DATA:  Left hip pain. EXAM: DG HIP (WITH OR WITHOUT PELVIS) 2-3V LEFT COMPARISON:  CT pelvis 04/04/2018 and pelvis radiograph 04/04/2018 FINDINGS: Minimally displaced fracture involving the superior left pubic ramus. There appears to be a mildly displaced fracture involving the inferior left pubic ramus. There is also discontinuity 2 left  sacral arcuate lines and suggestive for left sacral fracture. There may be an additional fracture near the base of the left superior pubic ramus near the acetabulum. Left hip is located without fracture. Normal appearance of the right hip. IMPRESSION: Left pubic rami fractures and left sacral fracture. Electronically Signed   By: Markus Daft M.D.   On: 05/14/2019 13:08    Procedures Procedures (including critical care time)  Medications Ordered in ED Medications  morphine 4 MG/ML injection 4 mg (has no administration in time range)  fentaNYL (SUBLIMAZE) injection 50 mcg (50 mcg Intravenous  Given 05/14/19 1301)  ondansetron (ZOFRAN) injection 4 mg (4 mg Intravenous Given 05/14/19 1258)    ED Course  I have reviewed the triage vital signs and the nursing notes.  Pertinent labs & imaging results that were available during my care of the patient were reviewed by me and considered in my medical decision making (see chart for details).    MDM Rules/Calculators/A&P                      69 year old female presenting as a level 2 trauma due to being on Plavix and hitting her head and a fall today.  Patient did not fall fully to the floor but had a fall a few weeks ago with persistent severe left hip pain.  She had followed up with orthopedics and told she had a pelvic fracture.  Unable to evaluate those notes but assume she was weightbearing as tolerated.  She reports she was initially not given any pain medication but it was so severe they called her in something yesterday which has not helped significantly with the pain.  Patient has minimal tenderness to her scalp and neck.  She is otherwise neurovascularly intact.  She is still having severe pain in her left hip and will ensure no new injury from recent fracture.  Patient given pain control.  Labs and imaging pending.  1:46 PM Patient's labs are within normal limits.  CT of the head and neck without acute findings.  Left hip film with pubic rami  fracture and sacral fracture which assuming is old from her fall a few weeks ago which has caused her ongoing pain.  Patient still requesting pain medication.  She was given another dose of IV pain control.  Final Clinical Impression(s) / ED Diagnoses Final diagnoses:  None    Rx / DC Orders ED Discharge Orders    None       Blanchie Dessert, MD 05/15/19 [phone]

## 2019-05-14 NOTE — H&P (Addendum)
[name] Admission History and Physical Service Pager: [phone]  Patient name: [name] Medical record number: [id number] Date of birth: 1950-03-31 Age: 69 y.o. Gender: female  Primary Care Provider: Richarda Osmond, DO Consultants: none Code Status: DNR/DNI Preferred Emergency Contact: Everitt Amber, [phone], husband  Chief Complaint: Left hip pain  Assessment and Plan: SAMANTHAN VIVIRITO is a 69 y.o. female presenting with 2-week history of left hip pain from known pubic rami fracture.  Unable to ambulate at home, not felt safe to go home.  Left hip pain/fracture to left inferior and superior pubic rami Patient with known pathologic fracture after fall.  Patient developed progressive left hip pain and difficulty with ambulation after the break.  Apparently is weightbearing and this is a nonoperative injury per what she was told by her orthopedist.  Given the difficulty with ambulation and progressive pain patient was admitted for rehab versus SNF placement.  We will have her evaluated by PT/OT while admitted.  Pain control.  Given that she developed a fracture we will check vitamin D level.  -Admit to inpatient family medicine, MedSurg, Dr. Nori Riis -Vital signs per floor routine -Evaluation by PT/OT -Social work to assist with placement once eval performed -Norco 5/325 every 8 hours as needed, can increase if needed -Lovenox for DVT prophylaxis  Normocytic anemia Hemoglobin 10.9 down from 12.8 back in August 2020.  MCV 90. likely secondary to blood loss from acute fracture, ecchymoses on arm which likely developed from Plavix.  Given that she is at high risk for developing blood clot given her history of pulmonary embolus and the acute trauma we will continue Lovenox while admitted as well as the Plavix.  Please see below for discussion on Plavix.  If hemoglobin continues to fall, will consider stopping one or both these agents. -Lovenox for  now for DVT prophylaxis -Recheck CBC in a.m. - Monitor for any signs symptoms of bleeding  History of TIA Had a transient ischemic attack back in 2016.  Has apparently been on Plavix since that time.  Has seen neurology several times since then for migraines, and continued necessity this medication has never been commented on.  Likely continued for tertiary prevention.  Could potentially discuss with outpatient neurology if this is necessary treatment for her. -Continue Plavix 75 mg daily -Could consider discussing stop this medication with neurology.  History of migraines With a long history of migraine for which she is seeing neurology many many times.  Currently taking propranolol and Zoloft for this.  Not complaining of any headaches during the exam. -Continue propranolol 10 mg twice daily -Continue Zoloft 100 mg daily -Takes 25 mg Zoloft at night if "things are going really bad", can add this on if needed -Continue zonergan 100 mg twice daily  Hyperlipidemia Last lipid panel approximately 9 months ago.  Total cholesterol 244, LDL 174 on the panel.  Apparently is getting Repatha 140 mg every 2 weeks. Recheck lipid panel in 3 months.  Hypertension BP 111/68 in the ed. Takes propranolol, furosemide, and ramapril. Will continue these medications while admitted. - continue propranolol 10 mg twice daily -Continue furosemide 40 mg daily -Continue ramipril 2.5 mg daily  CKD stage IIIa GFR 54.6, creatinine 1.1.  This appears stable from previous results.  Continue to monitor.  Can get BMP every other day or so while admitted.  Could consider discontinuing the ramipril if GFR falls   History of pulmonary embolus History of PE back in 2017.  Was  on Eliquis for period time, but this was discontinued years ago.  Monitor for any signs and symptoms of shortness of breath, chest pain, tachycardia while admitted.  GERD Doing well from the standpoint.  No complaints on exam.  Continue Protonix 40  mg daily.  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  Taking gabapentin 600 mg 3 times daily for this.  We will continue while admitted.  PMH is significant for HTN, migraine, H/O TIA, h/o pulmonary embolism, PVD, GERD, OA of knee, CKD stage III, anxiety  FEN/GI: Regular diet Prophylaxis: Lovenox for DVT prophylaxis  Disposition: SNF versus inpatient rehab, pending clinical course  History of Present Illness:  [name] is a 69 y.o. female presenting with 2-week history of left hip pain.  Per patient report she had a fall approximately 2 weeks ago and landed on some hard pavement on her left side.  She had almost immediate pain afterwards.  She is evaluated at her orthopedics office, had an x-ray and was told that she had "cracked" left hip.  She was told that she could be weightbearing on this injury and that she could use a cane or walker for comfort.  Patient continue to follow-up progressive pain to this area, as well as pain to the right lower extremity.  She had a telemedicine follow-up with her orthopedist on 3/5 and was given a prescription for pain medication.  She presented the emergency department this morning due to a fall at home.  She states that her "feet got out from under her".  She did not endorse any syncopal symptoms such as dizziness, lightheadedness, or double vision.  She hit her head on a carpeted floor.  Has mild tenderness back there, but no other symptoms.  Work-up in the emergency department consisted of a BMP, CBC, COVID-19 test, CT cervical spine, CT head, hip x-ray.  All laboratory work-up was negative aside from COVID-19 test which is still pending.  CT cervical spine showing previous spinal fusions, but no acute intracranial pathology in the neck and head.  Urine test is been ordered but not collected at this point.  Patient attempted to ambulate in the ED, but it was too painful cannot tolerate this.  Not being safe  her to go back home so patient is being admitted for further work-up and rehabilitation.  Review Of Systems: Per HPI with the following additions:   Review of Systems  Constitutional: Negative for chills and fever.  HENT: Negative for ear pain.   Respiratory: Negative for cough.   Cardiovascular: Negative for chest pain and palpitations.  Gastrointestinal: Negative for abdominal pain, constipation, diarrhea, nausea and vomiting.  Genitourinary: Negative for dysuria.  Musculoskeletal: Positive for falls and joint pain. Negative for myalgias.  Neurological: Negative for dizziness and headaches.  Psychiatric/Behavioral: Negative for depression.    Patient Active Problem List   Diagnosis Date Noted  . Closed fracture of multiple pubic rami, left, initial encounter ([place]) 05/14/2019    Past Medical History: HTN, migraine, H/O TIA, h/o pulmonary embolism, PVD, GERD, OA of knee, CKD stage III, anxiety  Past Surgical History: Previous cervical spinal fusion  Social History: Social History   Tobacco Use  . Smoking status: Not on file  Substance Use Topics  . Alcohol use: Not on file  . Drug use: Not on file   Additional social history:  Please also refer to relevant sections of EMR.  Family History: No family history on file.  Allergies and Medications: Allergies  Allergen Reactions  . Fish Allergy Anaphylaxis  . Shellfish Allergy Anaphylaxis   No current facility-administered medications on file prior to encounter.   Current Outpatient Medications on File Prior to Encounter  Medication Sig Dispense Refill  . cetirizine (ZYRTEC) 10 MG tablet Take 10 mg by mouth daily with breakfast.    . clopidogrel (PLAVIX) 75 MG tablet Take 75 mg by mouth daily with breakfast.    . ELDERBERRY PO Take 1 tablet by mouth daily with breakfast.    . furosemide (LASIX) 40 MG tablet Take 40 mg by mouth daily with breakfast.     . gabapentin (NEURONTIN) 600 MG tablet Take 600 mg by mouth in  the morning and at bedtime.     Marland Kitchen HYDROcodone-acetaminophen (NORCO/VICODIN) 5-325 MG tablet Take 1 tablet by mouth every 8 (eight) hours as needed (pain).     . Multiple Vitamins-Minerals (HAIR/SKIN/NAILS/BIOTIN) TABS Take 2 tablets by mouth daily with breakfast.    . pantoprazole (PROTONIX) 40 MG tablet Take 40 mg by mouth daily with breakfast.    . propranolol (INDERAL) 10 MG tablet Take 10 mg by mouth 2 (two) times daily with a meal.     . ramipril (ALTACE) 2.5 MG capsule Take 2.5 mg by mouth daily with breakfast.     . sertraline (ZOLOFT) 25 MG tablet Take 25 mg by mouth at bedtime.    . sertraline (ZOLOFT) 50 MG tablet Take 100 mg by mouth daily with breakfast.    . zonisamide (ZONEGRAN) 100 MG capsule Take 100 mg by mouth in the morning and at bedtime.     Marland Kitchen zonisamide (ZONEGRAN) 25 MG capsule Take 25 mg by mouth 2 (two) times daily as needed (nerve pain).       Objective: BP 111/68   Pulse 84   Temp 97.8 F (36.6 C) (Tympanic)   Resp 15   Ht 5\' 7"  (1.702 m)   Wt 81.8 kg   SpO2 96%   BMI 28.24 kg/m  Exam: General: 69 year old Caucasian female, no acute distress, resting comfortably Cardiovascular: Regular rate rhythm, no M/R/G.  Skin warm and dry Respiratory: Lungs clear to auscultation bilaterally, no accessory muscle use Gastrointestinal: Soft, nontender, nondistended MSK: Tenderness to palpation in posterior lateral left hip.  No notable swelling, or skin tightness consistent with hematoma formation. Derm: Skin warm and dry, Caucasian.  Multiple ecchymoses noted on arms Neuro: No focal neurologic deficit Psych: Appropriate, very pleasant  Labs and Imaging: CBC BMET  Recent Labs  Lab 05/14/19 1218  WBC 8.8  HGB 10.9*  HCT 35.1*  PLT 285   Recent Labs  Lab 05/14/19 1218  NA 138  K 4.1  CL 105  CO2 24  BUN 20  CREATININE 1.10*  GLUCOSE 129*  CALCIUM 8.4Guadalupe Dawn, MD 05/14/2019, 9:12 PM PGY-3, [place] Intern pager:  [phone], text pages welcome

## 2019-05-14 NOTE — Progress Notes (Signed)
CSW contacted by MD Reather Converse regarding home care plan for patient. CSW spoke with patient and noted consent to arrange home health services for PT/OT and for a wheelchair. CSW informed husband and noted their son will pick up wheelchair from ED. CSW informed them if prior to 7PM call CSW, if after 7pm call ED for RN. CSW informed RN where wheelchair is located. CSW attempted initial choice for home health but was unable to arrange services. CSW went through the home health ratings list sorted by highest rating first and noted [facility] was first to accept referral. Per [facility] liaison they will have their home health reach out to the family and will be out to the home tomorrow. CSW notes family and MD agree with plan. CSW notes husband is requesting to speak with the MD. MD Reather Converse has been informed. Please reach out to transitions of care for any further needs.

## 2019-05-14 NOTE — ED Notes (Signed)
Pt to CT via stretcher

## 2019-05-15 ENCOUNTER — Other Ambulatory Visit: Payer: Self-pay

## 2019-05-15 ENCOUNTER — Encounter (HOSPITAL_COMMUNITY): Payer: Self-pay | Admitting: Family Medicine

## 2019-05-15 DIAGNOSIS — R296 Repeated falls: Secondary | ICD-10-CM

## 2019-05-15 DIAGNOSIS — S32592A Other specified fracture of left pubis, initial encounter for closed fracture: Secondary | ICD-10-CM

## 2019-05-15 DIAGNOSIS — R29898 Other symptoms and signs involving the musculoskeletal system: Secondary | ICD-10-CM

## 2019-05-15 DIAGNOSIS — W19XXXA Unspecified fall, initial encounter: Secondary | ICD-10-CM

## 2019-05-15 LAB — CBC WITH DIFFERENTIAL/PLATELET
Abs Immature Granulocytes: 0.29 10*3/uL — ABNORMAL HIGH (ref 0.00–0.07)
Basophils Absolute: 0.1 10*3/uL (ref 0.0–0.1)
Basophils Relative: 1 %
Eosinophils Absolute: 0.2 10*3/uL (ref 0.0–0.5)
Eosinophils Relative: 2 %
HCT: 34.5 % — ABNORMAL LOW (ref 36.0–46.0)
Hemoglobin: 10.8 g/dL — ABNORMAL LOW (ref 12.0–15.0)
Immature Granulocytes: 4 %
Lymphocytes Relative: 15 %
Lymphs Abs: 1.1 10*3/uL (ref 0.7–4.0)
MCH: 28.1 pg (ref 26.0–34.0)
MCHC: 31.3 g/dL (ref 30.0–36.0)
MCV: 89.6 fL (ref 80.0–100.0)
Monocytes Absolute: 0.5 10*3/uL (ref 0.1–1.0)
Monocytes Relative: 6 %
Neutro Abs: 5.5 10*3/uL (ref 1.7–7.7)
Neutrophils Relative %: 72 %
Platelets: 249 10*3/uL (ref 150–400)
RBC: 3.85 MIL/uL — ABNORMAL LOW (ref 3.87–5.11)
RDW: 14.7 % (ref 11.5–15.5)
WBC: 7.6 10*3/uL (ref 4.0–10.5)
nRBC: 0 % (ref 0.0–0.2)

## 2019-05-15 LAB — URINALYSIS, ROUTINE W REFLEX MICROSCOPIC
Bilirubin Urine: NEGATIVE
Glucose, UA: NEGATIVE mg/dL
Hgb urine dipstick: NEGATIVE
Ketones, ur: NEGATIVE mg/dL
Leukocytes,Ua: NEGATIVE
Nitrite: NEGATIVE
Protein, ur: NEGATIVE mg/dL
Specific Gravity, Urine: 1.01 (ref 1.005–1.030)
pH: 6 (ref 5.0–8.0)

## 2019-05-15 LAB — SARS CORONAVIRUS 2 (TAT 6-24 HRS): SARS Coronavirus 2: NEGATIVE

## 2019-05-15 LAB — HIV ANTIBODY (ROUTINE TESTING W REFLEX): HIV Screen 4th Generation wRfx: NONREACTIVE

## 2019-05-15 LAB — VITAMIN D 25 HYDROXY (VIT D DEFICIENCY, FRACTURES): Vit D, 25-Hydroxy: 25.4 ng/mL — ABNORMAL LOW (ref 30–100)

## 2019-05-15 MED ORDER — BACLOFEN 10 MG PO TABS
5.0000 mg | ORAL_TABLET | Freq: Three times a day (TID) | ORAL | Status: DC | PRN
  Administered 2019-05-15 – 2019-05-17 (×3): 10 mg via ORAL
  Filled 2019-05-15 (×3): qty 1

## 2019-05-15 MED ORDER — HYDROCODONE-ACETAMINOPHEN 5-325 MG PO TABS
1.0000 | ORAL_TABLET | Freq: Four times a day (QID) | ORAL | Status: DC | PRN
  Administered 2019-05-15: 1 via ORAL
  Filled 2019-05-15 (×2): qty 1

## 2019-05-15 MED ORDER — OXYCODONE HCL ER 20 MG PO T12A
20.0000 mg | EXTENDED_RELEASE_TABLET | Freq: Two times a day (BID) | ORAL | Status: DC
  Administered 2019-05-15 – 2019-05-16 (×3): 20 mg via ORAL
  Filled 2019-05-15 (×2): qty 2
  Filled 2019-05-15: qty 1

## 2019-05-15 MED ORDER — HYDROCODONE-ACETAMINOPHEN 5-325 MG PO TABS: 2.0000 | ORAL_TABLET | Freq: Four times a day (QID) | ORAL | Status: DC | PRN

## 2019-05-15 MED ORDER — KETOROLAC TROMETHAMINE 15 MG/ML IJ SOLN
7.5000 mg | Freq: Four times a day (QID) | INTRAMUSCULAR | Status: DC | PRN
  Administered 2019-05-15: 7.5 mg via INTRAVENOUS
  Filled 2019-05-15: qty 1

## 2019-05-15 MED ORDER — HYDROCODONE-ACETAMINOPHEN 10-325 MG PO TABS
1.0000 | ORAL_TABLET | Freq: Four times a day (QID) | ORAL | Status: DC | PRN
  Administered 2019-05-15: 1 via ORAL
  Filled 2019-05-15: qty 1

## 2019-05-15 MED ORDER — MORPHINE SULFATE (PF) 2 MG/ML IV SOLN
2.0000 mg | INTRAVENOUS | Status: DC | PRN
  Administered 2019-05-15 – 2019-05-16 (×6): 2 mg via INTRAVENOUS
  Filled 2019-05-15 (×6): qty 1

## 2019-05-15 MED ORDER — MORPHINE SULFATE (PF) 4 MG/ML IV SOLN
4.0000 mg | Freq: Once | INTRAVENOUS | Status: AC
  Administered 2019-05-15: 4 mg via INTRAVENOUS
  Filled 2019-05-15: qty 1

## 2019-05-15 MED ORDER — MORPHINE SULFATE (PF) 2 MG/ML IV SOLN
2.0000 mg | INTRAVENOUS | Status: DC | PRN
  Administered 2019-05-15 (×2): 2 mg via INTRAVENOUS
  Filled 2019-05-15 (×2): qty 1

## 2019-05-15 MED ORDER — HYDROCODONE-ACETAMINOPHEN 5-325 MG PO TABS
1.0000 | ORAL_TABLET | Freq: Once | ORAL | Status: AC
  Administered 2019-05-15: 08:00:00 1 via ORAL

## 2019-05-15 MED ORDER — VITAMIN D (ERGOCALCIFEROL) 1.25 MG (50000 UNIT) PO CAPS
50000.0000 [IU] | ORAL_CAPSULE | ORAL | Status: DC
  Administered 2019-05-15 – 2019-05-22 (×2): 50000 [IU] via ORAL
  Filled 2019-05-15 (×2): qty 1

## 2019-05-15 MED ORDER — MORPHINE SULFATE (PF) 2 MG/ML IV SOLN: 2.0000 mg | INTRAVENOUS | Status: DC | PRN

## 2019-05-15 NOTE — ED Notes (Signed)
Pt expressed 8/10 pain to left leg, requested some pain medication. This RN advised that pt was given Vicodin only an hour prior and that further administration of medication would have to wait until an appropriate time. Pt expressed understanding

## 2019-05-15 NOTE — Progress Notes (Addendum)
Family Medicine Teaching Service Daily Progress Note Intern Pager: [phone]  Patient name: [name] Medical record number: [id number] Date of birth: 07-07-1950 Age: 69 y.o. Gender: female  Primary Care Provider: Richarda Osmond, DO Consultants: none Code Status: dnr/dni  Pt Overview and Major Events to Date:  3/6 admitted to fpts  Assessment and Plan: KYNSIE GOERKE is a 69 y.o. female presenting with 2-week history of left hip pain from known pubic rami fracture.  Unable to ambulate at home, not felt safe to go home.  Left hip pain/fracture to left inferior and superior pubic rami Patient with pathologic fracture after fall.  Biggest issue at this point is pain control and mobility.  Pain inadequately controlled on 5 mg Norco, will increase to 10 mg every 6 hours as needed.  Also added on morphine 2 mg as needed for breakthrough.  PT/OT eval pending.  Will make placement choice based off that.  Social work consult placed when eval is complete.  Vitamin D level pending. -Vital signs per floor routine -PT/OT eval pending -Social work to assist with placement once eval performed -Norco 10 mg every 8 hours as needed -We will stop Tylenol patient already receiving Tylenol for the Norco -Morphine 2 mg every 4 hours as needed for breakthrough -Lovenox for DVT prophylaxis  Normocytic anemia Hemoglobin stable at 10.8 from 10.9.  MCV 90.  Present etiology is easy bruising secondary to Plavix and likely bleeding into pelvis from hip fracture.  We will continue to monitor but given stability even after dose of Lovenox, unlikely to be a acute bleed at this point. -Lovenox for DVT prophylaxis -Daily CBC -Monitor for any signs symptoms of bleeding  History of TIA No symptoms this point.  Can follow-up with neurology or PCP discuss necessity of Plavix going forward.  History of migraines No complaints this a.m.  Currently taking propranolol, Zoloft, zonergan.  -Continue Zoloft 100  mg daily -If having problems at night can consider adding on as needed 25 mg Zoloft dose that she is on at home, holding that for now -Continue Dargan 100 mg twice daily -Propranolol 10 mg twice daily  Hyperlipidemia Currently receiving Repatha 140 mg every 2 weeks.  Next lipid panel due in about 3 months, can follow PCP for this issue.  Hypertension Well-controlled overnight and this a.m.  Most recent 136/90.  Continue propranolol, furosemide, ramipril - continue propranolol 10 mg twice daily -Continue furosemide 40 mg daily -Continue ramipril 2.5 mg daily  CKD stage IIIa GFR 54.6 on admission.  Creatinine 1.1 at that time.  Stable from previous results.  Likely get BMP every other day or so while admitted, can discontinue ramipril if GFR decreases.  History of pulmonary embolus History of PE back in 2017.  Was on Eliquis for period time, but this was discontinued years ago.  Monitor for any signs and symptoms of shortness of breath, chest pain, tachycardia while admitted.  GERD Doing well from the standpoint.  No complaints on exam.  Continue Protonix 40 mg daily.  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  Taking gabapentin 600 mg 3 times daily for this.  We will continue while admitted.  FEN/GI: regular PPx: lovenox  Disposition: Likely SNF versus inpatient rehab, pending clinical course  Subjective:  Patient is doing very well this morning aside from left hip pain.  States that the pain medications have helped some, of note take the edge off but she still a  good amount of pain.  Objective: Temp:  [97.8 F (36.6 C)-98.3 F (36.8 C)] 98.3 F (36.8 C) (03/07 0325) Pulse Rate:  [77-89] 89 (03/07 0325) Resp:  [15-24] 18 (03/07 0325) BP: (95-163)/(54-90) 136/90 (03/07 0325) SpO2:  [93 %-99 %] 99 % (03/07 0325) Weight:  [81.8 kg] 81.8 kg (03/06 1223) Physical Exam: General: Very pleasant 69 year old Caucasian female, no acute  distress, resting comfortably Cardiovascular: Regular rate and rhythm, no/R/G. Respiratory: Lungs clear to auscultation bilaterally, no accessory muscle use Abdomen: Soft, nontender, nondistended Extremities: Once again palpable tenderness posterior lateral left hip, no taut skin or any other findings consistent with developing hematoma  Laboratory: Recent Labs  Lab 05/14/19 1218 05/15/19 0503  WBC 8.8 7.6  HGB 10.9* 10.8*  HCT 35.1* 34.5*  PLT 285 249   Recent Labs  Lab 05/14/19 1218  NA 138  K 4.1  CL 105  CO2 24  BUN 20  CREATININE 1.10*  CALCIUM 8.4*  GLUCOSE 129*    Imaging/Diagnostic Tests: No new imaging  Guadalupe Dawn, MD 05/15/2019, 6:59 AM PGY-3, [place] Intern pager: [phone], text pages welcome

## 2019-05-15 NOTE — Progress Notes (Addendum)
FPTS Interim Progress Note  Patient seen and examined at bedside for reassessment of pain after changes in regimen by Dr. Nori Riis.  Patient reports significant improvement in her pain and states that she does not need any further changes to her regimen at this time.  Advised patient that ortho has been consulted and asked that she be seen this PM, which she was happy to hear.  Spoke with nurse and advised to contact resident on call at [phone] if patient's pain is not well controlled overnight.  For now, will continue with current regimen.  Dr. Chauncey Reading spoke with ortho.  Recommeded addition of toradol 7.5mg  q6h and addition of muscle relaxer such as methocarbamol.  Will add toradol.  Would opt to use baclofen given it is a muscle relaxer not on [place] criteria given patient's age.  Orders placed.   Riddle, DO 05/15/2019, 5:12 PM PGY-2, [place] Service pager [phone]

## 2019-05-15 NOTE — Progress Notes (Signed)
CALL PAGER [phone] for any questions or notifications regarding this patient  FMTS Attending Note: Courtney Mcmurray MD Still having trouble getting adequate pain management.  I had switched her to OxyContin with every 2 hour morphine breakthrough.  She has had the first dose of OxyContin and subsequent as needed morphine and is still 9 out of 10 or 10 out of 10 pain per nursing.  I still believe if we can actually get ahead of the pain, then we will have to use less overall opioid.  We will give her IV dose of 4 mg morphine now.  Nursing to reevaluate in 45 minutes to an hour.  If she still not having adequate pain control, will consider switching from morphine to Dilaudid as some people metabolize that better we might have better pain control.  I will leave the OxyContin on as I think that is probably best for her baseline pain management.  We have also consulted orthopedic surgery at her request.

## 2019-05-15 NOTE — Evaluation (Signed)
Physical Therapy Evaluation Patient Details Name: KOLBI LANING MRN: [id number] DOB: [birth year] Today's Date: 05/15/2019   History of Present Illness  XIMENNA BUYERS is a 69 y.o. female presenting 05/14/19 with 2-week history of left hip pain from known multiple left pubic rami and left sacral fractures after fall.  (Of note, was seen by Dr Maureen Ralphs in his office after fall). Progressively became unable to ambulate at home due to incr pain, and presented to hospital. PMH is significant for HTN, migraine, H/O TIA (on anticoagulation), h/o pulmonary embolism, PVD, OA of knee, CKD stage III, anxiety  Clinical Impression   Pt admitted with above diagnosis. Per PT order, ortho recommended WBAT (as pt had been PTA). Patient reports significant sudden change in pain after ~ 1 week at home. She had been able to walk with walker and then pain became too severe, including pain down left leg to her foot. Attempted mobility with pt maximally pre-medicated for pain and pt unable to tolerate getting to sitting at EOB. Currently pain management is primary concern to allow pt to participate with therapies--whether acute PT or PT at CIR vs SNF. ? Rehab consult to assist with pain management may be appropriate. Pt currently with functional limitations due to the deficits listed below (see PT Problem List). Pt may benefit from skilled PT to increase their independence and safety with mobility to allow discharge to the venue listed below.       Follow Up Recommendations Supervision/Assistance - 24 hour  (currently cannot tolerate acute PT; pt hopeful for CIR, currently does not want to consider SNF)    Equipment Recommendations  None recommended by PT    Recommendations for Other Services OT consult;Rehab consult     Precautions / Restrictions Precautions Precautions: Fall Restrictions Weight Bearing Restrictions: Yes LLE Weight Bearing: Weight bearing as tolerated(as commented in PT order)       Mobility  Bed Mobility Overal bed mobility: Needs Assistance Bed Mobility: Supine to Sit     Supine to sit: Total assist;+2 for physical assistance;HOB elevated     General bed mobility comments: HOB remained elevated; pt able to move legs to EOB without assist; as began lowering left leg off bed, PT supported leg and using bed pad attempted to pivot and sit at EOB (tech behind pt to assist torso). Patient immediately crying out to stop; attempted relaxation/encouragement and pt begging to return to bed and assisted to supine  Transfers                 General transfer comment: unable due to severe pain  Ambulation/Gait             General Gait Details: unable due to severe pain  Stairs            Wheelchair Mobility    Modified Rankin (Stroke Patients Only)       Balance                                             Pertinent Vitals/Pain Pain Assessment: 0-10 Pain Score: 10-Worst pain ever Pain Location: left hip down to foot Pain Descriptors / Indicators: Stabbing;Grimacing;Crying Pain Intervention(s): Limited activity within patient's tolerance;Monitored during session;Premedicated before session;Repositioned;Utilized relaxation techniques    Home Living Family/patient expects to be discharged to:: Private residence Living Arrangements: Spouse/significant other Available Help at Discharge: Family Type of  Home: House Home Access: Stairs to enter Entrance Stairs-Rails: Right;Left;Can reach both Entrance Stairs-Number of Steps: 2 Home Layout: One level Home Equipment: Walker - 2 wheels;Cane - single point      Prior Function Level of Independence: Independent         Comments: prior to fall     Hand Dominance        Extremity/Trunk Assessment   Upper Extremity Assessment Upper Extremity Assessment: Defer to OT evaluation    Lower Extremity Assessment Lower Extremity Assessment: RLE deficits/detail;LLE  deficits/detail RLE Deficits / Details: +pain in left pelvis with supineAROM hip/knee flexion 90/90, ankle DF 5/5 RLE: Unable to fully assess due to pain RLE Sensation: WNL LLE Deficits / Details: supine AROM hip/knee flexion 60, ankle ROM WNL; could not tolerate ankle DF MMT LLE: Unable to fully assess due to pain LLE Sensation: (sensory changes L4-5 distribution)    Cervical / Trunk Assessment Cervical / Trunk Assessment: Other exceptions Cervical / Trunk Exceptions: obese  Communication   Communication: No difficulties  Cognition Arousal/Alertness: Awake/alert Behavior During Therapy: WFL for tasks assessed/performed;Anxious Overall Cognitive Status: Within Functional Limits for tasks assessed                                        General Comments General comments (skin integrity, edema, etc.): Spoke with RN and secure chat to MD re: pt's intolerance despite max pre-medication. Also notified re: pt's description of sudden/rapid change in pain levels while at home (see history).     Exercises General Exercises - Lower Extremity Ankle Circles/Pumps: AROM;Both;10 reps;Supine Heel Slides: AROM;Both;Other reps (comment);Supine   Assessment/Plan    PT Assessment Patient needs continued PT services  PT Problem List Decreased range of motion;Decreased activity tolerance;Decreased mobility;Decreased knowledge of use of DME;Impaired sensation;Obesity;Pain       PT Treatment Interventions DME instruction;Gait training;Stair training;Functional mobility training;Therapeutic activities;Therapeutic exercise;Patient/family education    PT Goals (Current goals can be found in the Care Plan section)  Acute Rehab PT Goals Patient Stated Goal: I want to stay here for therapy and then go home PT Goal Formulation: With patient Time For Goal Achievement: 05/29/19 Potential to Achieve Goals: Fair(depends on pain control)    Frequency Min 4X/week   Barriers to discharge  Inaccessible home environment 2 steps to enter    Co-evaluation               AM-PAC PT "6 Clicks" Mobility  Outcome Measure Help needed turning from your back to your side while in a flat bed without using bedrails?: Total Help needed moving from lying on your back to sitting on the side of a flat bed without using bedrails?: Total Help needed moving to and from a bed to a chair (including a wheelchair)?: Total Help needed standing up from a chair using your arms (e.g., wheelchair or bedside chair)?: Total Help needed to walk in hospital room?: Total Help needed climbing 3-5 steps with a railing? : Total 6 Click Score: 6    End of Session   Activity Tolerance: Patient limited by pain Patient left: in bed;with call bell/phone within reach Nurse Communication: Mobility status;Other (comment)(severe pain) PT Visit Diagnosis: Difficulty in walking, not elsewhere classified (R26.2);Pain Pain - Right/Left: Left Pain - part of body: Hip;Leg    Time: [id number] PT Time Calculation (min) (ACUTE ONLY): 21 min   Charges:   PT Evaluation $PT Eval  High Complexity: 1 High           Arby Barrette, PT Pager [phone]   Rexanne Mano 05/15/2019, 10:44 AM

## 2019-05-15 NOTE — Progress Notes (Signed)
Patient is in severe pain. She gave her hip pain a 10 out of 10. I gave her morphine. She lives with husband in [place]. May need home health or inpatient rehab

## 2019-05-16 ENCOUNTER — Inpatient Hospital Stay (HOSPITAL_COMMUNITY): Payer: Medicare Other

## 2019-05-16 LAB — CBC WITH DIFFERENTIAL/PLATELET
Abs Immature Granulocytes: 0.62 10*3/uL — ABNORMAL HIGH (ref 0.00–0.07)
Basophils Absolute: 0.1 10*3/uL (ref 0.0–0.1)
Basophils Relative: 2 %
Eosinophils Absolute: 0.2 10*3/uL (ref 0.0–0.5)
Eosinophils Relative: 2 %
HCT: 35.8 % — ABNORMAL LOW (ref 36.0–46.0)
Hemoglobin: 11.4 g/dL — ABNORMAL LOW (ref 12.0–15.0)
Immature Granulocytes: 7 %
Lymphocytes Relative: 18 %
Lymphs Abs: 1.6 10*3/uL (ref 0.7–4.0)
MCH: 27.9 pg (ref 26.0–34.0)
MCHC: 31.8 g/dL (ref 30.0–36.0)
MCV: 87.7 fL (ref 80.0–100.0)
Monocytes Absolute: 0.6 10*3/uL (ref 0.1–1.0)
Monocytes Relative: 7 %
Neutro Abs: 5.7 10*3/uL (ref 1.7–7.7)
Neutrophils Relative %: 64 %
Platelets: 274 10*3/uL (ref 150–400)
RBC: 4.08 MIL/uL (ref 3.87–5.11)
RDW: 14.6 % (ref 11.5–15.5)
WBC: 8.8 10*3/uL (ref 4.0–10.5)
nRBC: 0 % (ref 0.0–0.2)

## 2019-05-16 LAB — URINE CULTURE: Culture: <10000 — AB

## 2019-05-16 LAB — BASIC METABOLIC PANEL
Anion gap: 9 (ref 5–15)
BUN: 13 mg/dL (ref 8–23)
CO2: 30 mmol/L (ref 22–32)
Calcium: 9.2 mg/dL (ref 8.9–10.3)
Chloride: 103 mmol/L (ref 98–111)
Creatinine, Ser: 1.16 mg/dL — ABNORMAL HIGH (ref 0.44–1.00)
GFR calc Af Amer: 56 mL/min — ABNORMAL LOW (ref >60)
GFR calc non Af Amer: 49 mL/min — ABNORMAL LOW (ref >60)
Glucose, Bld: 114 mg/dL — ABNORMAL HIGH (ref 70–99)
Potassium: 5.3 mmol/L — ABNORMAL HIGH (ref 3.5–5.1)
Sodium: 142 mmol/L (ref 135–145)

## 2019-05-16 MED ORDER — OXYCODONE HCL 5 MG PO TABS
10.0000 mg | ORAL_TABLET | ORAL | Status: DC | PRN
  Administered 2019-05-16: 10 mg via ORAL
  Filled 2019-05-16: qty 2

## 2019-05-16 MED ORDER — HYDROMORPHONE HCL 1 MG/ML IJ SOLN
1.0000 mg | Freq: Four times a day (QID) | INTRAMUSCULAR | Status: DC
  Administered 2019-05-16 – 2019-05-17 (×5): 1 mg via INTRAVENOUS
  Filled 2019-05-16 (×5): qty 1

## 2019-05-16 MED ORDER — OXYCODONE HCL 5 MG PO TABS: 10.0000 mg | ORAL_TABLET | ORAL | Status: DC | PRN

## 2019-05-16 MED ORDER — SENNA 8.6 MG PO TABS
1.0000 | ORAL_TABLET | Freq: Every day | ORAL | Status: DC | PRN
  Administered 2019-05-17: 8.6 mg via ORAL
  Filled 2019-05-16: qty 1

## 2019-05-16 MED ORDER — HYDROMORPHONE HCL 2 MG/ML IJ SOLN
2.0000 mg | Freq: Four times a day (QID) | INTRAMUSCULAR | Status: DC
  Administered 2019-05-18 (×2): 2 mg via INTRAVENOUS
  Filled 2019-05-16 (×2): qty 1

## 2019-05-16 MED ORDER — ACETAMINOPHEN 500 MG PO TABS
1000.0000 mg | ORAL_TABLET | Freq: Four times a day (QID) | ORAL | Status: DC
  Administered 2019-05-16 – 2019-05-20 (×16): 1000 mg via ORAL
  Filled 2019-05-16 (×16): qty 2

## 2019-05-16 MED ORDER — POLYETHYLENE GLYCOL 3350 17 G PO PACK
17.0000 g | PACK | Freq: Every day | ORAL | Status: DC
  Administered 2019-05-16 – 2019-05-23 (×4): 17 g via ORAL
  Filled 2019-05-16 (×6): qty 1

## 2019-05-16 MED ORDER — OXYCODONE HCL 5 MG PO TABS
20.0000 mg | ORAL_TABLET | Freq: Once | ORAL | Status: AC
  Administered 2019-05-16: 13:00:00 20 mg via ORAL
  Filled 2019-05-16: qty 4

## 2019-05-16 MED ORDER — OXYCODONE HCL ER 15 MG PO T12A: 30.0000 mg | EXTENDED_RELEASE_TABLET | Freq: Two times a day (BID) | ORAL | Status: DC

## 2019-05-16 MED ORDER — HYDROMORPHONE HCL 1 MG/ML IJ SOLN
1.0000 mg | Freq: Once | INTRAMUSCULAR | Status: AC
  Administered 2019-05-16: 16:00:00 1 mg via INTRAVENOUS
  Filled 2019-05-16: qty 1

## 2019-05-16 NOTE — Progress Notes (Signed)
Rehab Admissions Coordinator Note:  Per PT/OT recommendation, patient was screened by Michel Santee for appropriateness for an Inpatient Acute Rehab Consult.  At this time, we are recommending Inpatient Rehab consult.  Please place an order if pt would like to be considered for CIR.   Michel Santee 05/16/2019, 1:42 PM  I can be reached at [phone].

## 2019-05-16 NOTE — Progress Notes (Signed)
Went to evaluate patient to assess pain control.  Patient was sitting in bedside chair.  She reported that she had issues with pain when she went to the CT machine but it has been coming down since that time.  She reports that it is 8 out of 10 right now.  She is also complaining of "electric" pain down the anterior aspect of her left leg to the top of her left foot.  She is due for her scheduled pain medicine and is okay with getting some at this time.  No other concerns at this time.  Plan: Dilaudid 1 mg for moderate pain, 2 mg for severe pain every 6 hours scheduled Discontinue oxycodone

## 2019-05-16 NOTE — Progress Notes (Signed)
Physical Therapy Treatment Patient Details Name: [name] MRN: [id number] DOB: Jun 30, 1950 Today's Date: 05/16/2019    History of Present Illness [name] is a 69 y.o. female presenting 05/14/19 with 2-week history of left hip pain from known multiple left pubic rami and left sacral fractures after fall.  (Of note, was seen by Dr Maureen Ralphs in his office after fall). Progressively became unable to ambulate at home due to incr pain, and presented to hospital. PMH is significant for HTN, migraine, H/O TIA (on anticoagulation), h/o pulmonary embolism, PVD, OA of knee, CKD stage III, anxiety    PT Comments    Continuing work on functional mobility and activity tolerance;  [name] indicated she practiced moving her LLE all night, and that work has paid off in incr activity tolerance today; Moving slowly, but only needing min assist for bed mobility, and she was able to initiate gait training today; [name] is motivated to get better, improve independence, and get back home; She hopes to go to CIR for post-acute rehab, and Given the progress/improvements today compared to yesterday, I believe CIR is a good option for post-acute rehab; will ask CIR Admissions Coord to weigh in.  Follow Up Recommendations  CIR     Equipment Recommendations  None recommended by PT    Recommendations for Other Services       Precautions / Restrictions Precautions Precautions: Fall Restrictions Weight Bearing Restrictions: Yes LLE Weight Bearing: Weight bearing as tolerated    Mobility  Bed Mobility Overal bed mobility: Needs Assistance Bed Mobility: Supine to Sit     Supine to sit: Min assist;Min guard     General bed mobility comments: minA to scoot hips to EOB;otherwise minguard;pt requesting to complete bed mobility without physical assistnace   Transfers Overall transfer level: Needs assistance Equipment used: Rolling walker (2 wheeled) Transfers: Sit to/from [name] Sit to Stand: Min assist;+2 physical assistance;+2 safety/equipment Stand pivot transfers: Min assist;+2 safety/equipment       General transfer comment: minA for powerup into standing;minA+1 for physical assistance with stand-pivot +2 for safety with chair follow  Ambulation/Gait Ambulation/Gait assistance: Min assist;+2 safety/equipment(chair push) Gait Distance (Feet): 8 Feet Assistive device: Rolling walker (2 wheeled) Gait Pattern/deviations: Step-to pattern;Decreased stance time - left     General Gait Details: Cues for gait sequence, and to use RW to take pressure off of painful LLE in stance; she essentially performed these steps with LLE TWB   Stairs             Wheelchair Mobility    Modified Rankin (Stroke Patients Only)       Balance Overall balance assessment: Needs assistance Sitting-balance support: Single extremity supported;Feet supported Sitting balance-Leahy Scale: Fair Sitting balance - Comments: UE support for pain management; very antalgic R lean initially sitting up   Standing balance support: Bilateral upper extremity supported Standing balance-Leahy Scale: Poor Standing balance comment: heavy reliance on BUE support in standing                            Cognition Arousal/Alertness: Awake/alert Behavior During Therapy: WFL for tasks assessed/performed;Anxious Overall Cognitive Status: Within Functional Limits for tasks assessed                                        Exercises  General Comments        Pertinent Vitals/Pain Pain Assessment: Faces Faces Pain Scale: Hurts even more Pain Location: left hip down to foot Pain Descriptors / Indicators: Stabbing;Grimacing;Crying Pain Intervention(s): Monitored during session;Premedicated before session    Home Living Family/patient expects to be discharged to:: Private residence Living Arrangements: Spouse/significant other Available Help at  Discharge: Family Type of Home: House Home Access: Stairs to enter Entrance Stairs-Rails: Right;Left;Can reach both Home Layout: One level Home Equipment: Environmental consultant - 2 wheels;Cane - single point      Prior Function Level of Independence: Independent      Comments: prior to fall   PT Goals (current goals can now be found in the care plan section) Acute Rehab PT Goals Patient Stated Goal: I want to stay here for therapy and then go home PT Goal Formulation: With patient Time For Goal Achievement: 05/29/19 Potential to Achieve Goals: Fair Progress towards PT goals: Progressing toward goals    Frequency    Min 4X/week      PT Plan Current plan remains appropriate    [name]-evaluation PT/OT/SLP [name]-Evaluation/Treatment: Yes Reason for [name]-Treatment: For patient/therapist safety;To address functional/ADL transfers(So painful yesterday with PT) PT goals addressed during session: Mobility/safety with mobility OT goals addressed during session: ADL's and self-care      AM-PAC PT "6 Clicks" Mobility   Outcome Measure  Help needed turning from your back to your side while in a flat bed without using bedrails?: A Little Help needed moving from lying on your back to sitting on the side of a flat bed without using bedrails?: A Little Help needed moving to and from a bed to a chair (including a wheelchair)?: A Lot Help needed standing up from a chair using your arms (e.g., wheelchair or bedside chair)?: A Lot Help needed to walk in hospital room?: A Lot Help needed climbing 3-5 steps with a railing? : Total 6 Click Score: 13    End of Session Equipment Utilized During Treatment: Gait belt Activity Tolerance: Patient tolerated treatment well Patient left: in chair;with call bell/phone within reach;with chair alarm set Nurse Communication: Mobility status PT Visit Diagnosis: Difficulty in walking, not elsewhere classified (R26.2);Pain Pain - Right/Left: Left Pain - part of body:  Hip;Leg     Time: [id number] PT Time Calculation (min) (ACUTE ONLY): 32 min  Charges:    1 Gait Training                    Roney [place], [place]  Acute Rehabilitation Services Pager [phone] Office [phone]    Colletta [place] 05/16/2019, 12:55 PM

## 2019-05-16 NOTE — Evaluation (Signed)
Occupational Therapy Evaluation Patient Details Name: [name] MRN: [id number] DOB: 26-Nov-1950 Today's Date: 05/16/2019    History of Present Illness [name] is a 69 y.o. female presenting 05/14/19 with 2-week history of left hip pain from known multiple left pubic rami and left sacral fractures after fall.  (Of note, was seen by Dr Maureen Ralphs in his office after fall). Progressively became unable to ambulate at home due to incr pain, and presented to hospital. PMH is significant for HTN, migraine, H/O TIA (on anticoagulation), h/o pulmonary embolism, PVD, OA of knee, CKD stage III, anxiety   Clinical Impression   PTA, pt was living at home with her husband, who she reports is unable to physically assist her, pt reports she was independent with ADL/IADL and functional mobiltiy. Pt currently requires minguard for bed mobility with minA to scoot her hips to the EOB. She reported increased dizziness with bed mobility, BP 161/116 (pt was moving her arm), following transfer to the chair, BP 179/99. RN notified. She required minA for LB dressing and modA for posterior pericare in standing. Due to decline in current level of function, pt would benefit from acute OT to address established goals to facilitate safe D/C to venue listed below. At this time, recommend CIR follow-up. Will continue to follow acutely.     Follow Up Recommendations  CIR    Equipment Recommendations  None recommended by OT    Recommendations for Other Services       Precautions / Restrictions Precautions Precautions: Fall Restrictions Weight Bearing Restrictions: Yes LLE Weight Bearing: Weight bearing as tolerated      Mobility Bed Mobility Overal bed mobility: Needs Assistance Bed Mobility: Supine to Sit     Supine to sit: Min assist;Min guard     General bed mobility comments: minA to scoot hips to EOB;otherwise minguard;pt requesting to complete bed mobility without physical assistnace    Transfers Overall transfer level: Needs assistance Equipment used: Rolling walker (2 wheeled) Transfers: Sit to/from [facility] Sit to Stand: Min assist;+2 physical assistance;+2 safety/equipment Stand pivot transfers: Min assist;+2 safety/equipment       General transfer comment: minA for powerup into standing;minA+1 for physical assistance with stand-pivot +2 for safety with chair follow    Balance Overall balance assessment: Needs assistance Sitting-balance support: Single extremity supported;Feet supported Sitting balance-Leahy Scale: Fair Sitting balance - Comments: UE support for pain management   Standing balance support: Bilateral upper extremity supported Standing balance-Leahy Scale: Poor Standing balance comment: heavy reliance on BUE support in standing                           ADL either performed or assessed with clinical judgement   ADL Overall ADL's : Needs assistance/impaired Eating/Feeding: Set up;Sitting   Grooming: Min guard;Sitting   Upper Body Bathing: Min guard;Sitting   Lower Body Bathing: Minimal assistance;Sit to/from stand;+2 for physical assistance;+2 for safety/equipment   Upper Body Dressing : Set up;Sitting   Lower Body Dressing: Minimal assistance;+2 for physical assistance;+2 for safety/equipment;Sit to/from stand   Toilet Transfer: Minimal assistance;+2 for physical assistance;+2 for safety/equipment;Stand-pivot;RW Toilet Transfer Details (indicate cue type and reason): simulated to the recliner Toileting- Clothing Manipulation and Hygiene: Moderate assistance Toileting - Clothing Manipulation Details (indicate cue type and reason): modA for posterior pericare     Functional mobility during ADLs: Minimal assistance;+2 for safety/equipment;Rolling walker General ADL Comments: pt reported dizziness with initial sitting;BP in chart (see vital signs  section);she required assistance for posterior pericare in  standing due to reliance on BUE support on RW;stand pivot to the recliner followed by 5 steps forward;decreased activity tolerance, limited by pain      Vision Patient Visual Report: No change from baseline       Perception     Praxis      Pertinent Vitals/Pain Pain Assessment: Faces Faces Pain Scale: Hurts even more Pain Location: left hip down to foot Pain Descriptors / Indicators: Stabbing;Grimacing;Crying Pain Intervention(s): Limited activity within patient's tolerance;Monitored during session;Repositioned     Hand Dominance Right   Extremity/Trunk Assessment Upper Extremity Assessment Upper Extremity Assessment: Overall WFL for tasks assessed   Lower Extremity Assessment Lower Extremity Assessment: Defer to PT evaluation   Cervical / Trunk Assessment Cervical / Trunk Assessment: Other exceptions Cervical / Trunk Exceptions: obese   Communication Communication Communication: No difficulties   Cognition Arousal/Alertness: Awake/alert Behavior During Therapy: WFL for tasks assessed/performed;Anxious Overall Cognitive Status: Within Functional Limits for tasks assessed                                     General Comments       Exercises     Shoulder Instructions      Home Living Family/patient expects to be discharged to:: Private residence Living Arrangements: Spouse/significant other Available Help at Discharge: Family Type of Home: House Home Access: Stairs to enter Technical brewer of Steps: 2 Entrance Stairs-Rails: Right;Left;Can reach both [place]: One level         Bathroom Toilet: Handicapped height     Home Equipment: Environmental consultant - 2 wheels;Cane - single point          Prior Functioning/Environment Level of Independence: Independent        Comments: prior to fall        OT Problem List: Decreased strength;Decreased activity tolerance;Impaired balance (sitting and/or standing);Decreased safety  awareness;Decreased knowledge of use of DME or AE;Cardiopulmonary status limiting activity;Pain      OT Treatment/Interventions: Self-care/ADL training;Therapeutic exercise;DME and/or AE instruction;Therapeutic activities;Patient/family education;Balance training    OT Goals(Current goals can be found in the care plan section) Acute Rehab OT Goals Patient Stated Goal: I want to stay here for therapy and then go home OT Goal Formulation: With patient Time For Goal Achievement: 05/30/19 Potential to Achieve Goals: Good ADL Goals Pt Will Perform Grooming: with modified independence;standing Pt Will Perform Lower Body Dressing: with modified independence;sit to/from stand;with adaptive equipment Pt Will Transfer to Toilet: with modified independence;ambulating Pt Will Perform Tub/Shower Transfer: Tub transfer;with modified independence;ambulating;shower seat  OT Frequency: Min 2X/week   Barriers to D/C: Decreased caregiver support  pt reports her husband is unable to physically assist       Co-evaluation PT/OT/SLP Co-Evaluation/Treatment: Yes Reason for Co-Treatment: For patient/therapist safety;To address functional/ADL transfers   OT goals addressed during session: ADL's and self-care      AM-PAC OT "6 Clicks" Daily Activity     Outcome Measure Help from another person eating meals?: A Little Help from another person taking care of personal grooming?: A Little Help from another person toileting, which includes using toliet, bedpan, or urinal?: A Little Help from another person bathing (including washing, rinsing, drying)?: A Little Help from another person to put on and taking off regular upper body clothing?: A Little Help from another person to put on and taking off regular lower body clothing?: A Little  6 Click Score: 18   End of Session Equipment Utilized During Treatment: Gait belt;Rolling walker Nurse Communication: Mobility status(BP)  Activity Tolerance: Patient  tolerated treatment well;Patient limited by pain Patient left: in chair;with call bell/phone within reach;with chair alarm set  OT Visit Diagnosis: Unsteadiness on feet (R26.81);Other abnormalities of gait and mobility (R26.89);Muscle weakness (generalized) (M62.81);History of falling (Z91.81);Pain Pain - Right/Left: Left Pain - part of body: Hip                Time: 1005-1034 OT Time Calculation (min): 29 min Charges:  OT General Charges $OT Visit: 1 Visit OT Evaluation $OT Eval Moderate Complexity: [place] OTR/L Acute Rehabilitation Services Office: [place] 05/16/2019, 12:04 PM

## 2019-05-16 NOTE — Plan of Care (Signed)
  Problem: Education: Goal: Knowledge of General Education information will improve Description: Including pain rating scale, medication(s)/side effects and non-pharmacologic comfort measures Outcome: Progressing   Problem: Health Behavior/Discharge Planning: Goal: Ability to manage health-related needs will improve Outcome: Progressing   Problem: Clinical Measurements: Goal: Ability to maintain clinical measurements within normal limits will improve Outcome: Progressing   Problem: Activity: Goal: Risk for activity intolerance will decrease Outcome: Progressing   Problem: Nutrition: Goal: Adequate nutrition will be maintained Outcome: Progressing   Problem: Coping: Goal: Level of anxiety will decrease Outcome: Progressing   Problem: Elimination: Goal: Will not experience complications related to bowel motility Outcome: Progressing   Problem: Skin Integrity: Goal: Risk for impaired skin integrity will decrease Outcome: Progressing

## 2019-05-16 NOTE — Progress Notes (Signed)
Family Medicine Teaching Service Daily Progress Note Intern Pager: [phone]  Patient name: [name] Medical record number: [id number] Date of birth: 1950/10/30 Age: 69 y.o. Gender: female  Primary Care Provider: Richarda Osmond, DO Consultants: none Code Status: dnr/dni  Pt Overview and Major Events to Date:  3/6 admitted to fpts  Assessment and Plan: [name] is a 69 y.o. female presenting with 2-week history of left hip pain from known pubic rami fracture.  Unable to ambulate at home, not felt safe to go home.  Left hip pain/fracture to left inferior and superior pubic rami Patient is experiencing pain in left groin which is radiating to her hip and left lumbar back. Pain yesterday was 20/20 and now it is 10/10. Medications given her have "taken the edge off" but there is still pain on minimal movement. Only able to do minimal PT yesterday due to pain. Is able to move her left LE more today compared to yesterday. On exam has tenderness over left lumbar region. Able to move her left toes.  -Vital signs per floor routine -PT/OT: 24 hr assistance-hopeful for CIR, pt does not want SNF -Social work to assist with placement once eval performed -Toradol 7.5mg  Q6H -Tylenol 1000mg  Q6H -Morphine 2 mg every 2 hours as needed for breakthrough -Oxycodone IR 20mg  once, oxycodone IR 10mg  Q4H -Norco discontinued -Lovenox for DVT prophylaxis -Ortho following, appreciate recommendations  -Vitamin D: 50,000 units every 7 days started on 3/7   Normocytic anemia Hemoglobin 11.4 this am, 10.8>10.9.  MCV 90. Likely 2/2 secondary to Plavix and bleeding into pelvis from hip fracture. We will continue to monitor but given stability even after dose of Lovenox, unlikely to be a acute bleed at this point. -Lovenox for DVT prophylaxis -Daily CBC -Monitor for any signs symptoms of bleeding  History of TIA No symptoms this point.  Can follow-up with neurology or PCP discuss necessity of  Plavix going forward.  History of migraines Denies headache. Currently taking propranolol, Zoloft, zonergan.  -Continue Zoloft 100 mg daily. If having problems at night can consider adding on as needed 25 mg Zoloft dose that she is on at home, holding that for now -Continue Dargan 100 mg twice daily -Propranolol 10 mg twice daily  Hyperlipidemia Currently receiving Repatha 140 mg every 2 weeks.  Next lipid panel due in about 3 months, can follow PCP for this issue.  Hypertension Well-controlled overnight and this a.m.  Most recent 145/80.  Continue propranolol, furosemide, ramipril - continue propranolol 10 mg twice daily -Continue furosemide 40 mg daily -Continue ramipril 2.5 mg daily  CKD stage IIIa GFR 54.6 on admission.  Creatinine 1.16 today, 1.1 on 3/6 Stable from previous results. -Likely get BMP every other day or so while admitted -Can discontinue ramipril if GFR decreases.  History of pulmonary embolus History of PE back in 2017.  Was on Eliquis for period time, but this was discontinued years ago.  Monitor for any signs and symptoms of shortness of breath, chest pain, tachycardia while admitted.  GERD Asymptomatic -Continue Protonix 40 mg daily.  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  Taking gabapentin 600 mg 3 times daily for this.  We will continue while admitted.  FEN/GI: regular PPx: lovenox  Disposition: Likely CIR, pending clinical course  Subjective:  Reports 10/10 severity pain on movement of her left hip. Pain medication that we have given her only takes the edge off. Morphine helps a little more  than oxycodone. Would like strong painkillers. Tolerating PO   Objective: Temp:  [97.4 F (36.3 C)-98.4 F (36.9 C)] 98.2 F (36.8 C) (03/08 0500) Pulse Rate:  [78-95] 81 (03/08 0500) Resp:  [18-20] 18 (03/08 0500) BP: (134-172)/(75-95) 145/80 (03/08 0500) SpO2:  [94 %-99 %] 98 % (03/08 0500)    Physical  Exam: General: Alert, pleasant 69 yr old female, no acute distress Cardio: Normal S1 and S2, RRR. No murmurs or rubs.   Pulm: CTAB, normal WOB  Abdomen: Bowel sounds normal. Abdomen soft and non-tender.  Extremities: No peripheral edema. Warm/ well perfused.Tenderness on palpation of left hip posterior lateral hip and left lumbar paraspinal region.  Neuro: Cranial nerves grossly intact  Laboratory: Recent Labs  Lab 05/14/19 1218 05/15/19 0503 05/16/19 0247  WBC 8.8 7.6 8.8  HGB 10.9* 10.8* 11.4*  HCT 35.1* 34.5* 35.8*  PLT 285 249 274   Recent Labs  Lab 05/14/19 1218 05/16/19 0247  NA 138 142  K 4.1 5.3*  CL 105 103  CO2 24 30  BUN 20 13  CREATININE 1.10* 1.16*  CALCIUM 8.4* 9.2  GLUCOSE 129* 114*    Imaging/Diagnostic Tests: No new imaging  Lattie Haw, MD 05/16/2019, 6:06 AM PGY-3, [place] Intern pager: [phone], text pages welcome

## 2019-05-16 NOTE — Progress Notes (Signed)
Chaplain provided paperwork for Advanced Directive. The patient did not want to complete the living will at this time, but did receive the paperwork and said they would look it over and decide later. The chaplain is available for follow-up if needed.  Brion Aliment Chaplain Resident For questions concerning this note please contact me by pager [phone]

## 2019-05-16 NOTE — Progress Notes (Signed)
Reviewed patient's pain since this morning. Has had 20mg  Oxycodone at 9:00am and 20mg  IR Oxycodone at 1:30pm. Has had 3 X morphine 2mg  today, last dose at 11:30am. Oxycodone takes the edge off the pain but still feels very uncomfortable on slightest movement. Morphine helps better with the pain. I explained we are doing our best to get her comfortable and will be adjusting her pain medications today if she is still uncomfortable.   Lattie Haw PGY-1, [place]

## 2019-05-17 ENCOUNTER — Other Ambulatory Visit: Payer: Self-pay | Admitting: *Deleted

## 2019-05-17 DIAGNOSIS — M25552 Pain in left hip: Secondary | ICD-10-CM

## 2019-05-17 DIAGNOSIS — S32592D Other specified fracture of left pubis, subsequent encounter for fracture with routine healing: Secondary | ICD-10-CM

## 2019-05-17 LAB — CBC
HCT: 37 % (ref 36.0–46.0)
Hemoglobin: 11.6 g/dL — ABNORMAL LOW (ref 12.0–15.0)
MCH: 27.6 pg (ref 26.0–34.0)
MCHC: 31.4 g/dL (ref 30.0–36.0)
MCV: 88.1 fL (ref 80.0–100.0)
Platelets: 302 10*3/uL (ref 150–400)
RBC: 4.2 MIL/uL (ref 3.87–5.11)
RDW: 14.5 % (ref 11.5–15.5)
WBC: 8.7 10*3/uL (ref 4.0–10.5)
nRBC: 0 % (ref 0.0–0.2)

## 2019-05-17 LAB — COMPREHENSIVE METABOLIC PANEL
ALT: 29 U/L (ref 0–44)
AST: 26 U/L (ref 15–41)
Albumin: 3.1 g/dL — ABNORMAL LOW (ref 3.5–5.0)
Alkaline Phosphatase: 162 U/L — ABNORMAL HIGH (ref 38–126)
Anion gap: 12 (ref 5–15)
BUN: 19 mg/dL (ref 8–23)
CO2: 29 mmol/L (ref 22–32)
Calcium: 9.2 mg/dL (ref 8.9–10.3)
Chloride: 98 mmol/L (ref 98–111)
Creatinine, Ser: 1.18 mg/dL — ABNORMAL HIGH (ref 0.44–1.00)
GFR calc Af Amer: 55 mL/min — ABNORMAL LOW (ref >60)
GFR calc non Af Amer: 48 mL/min — ABNORMAL LOW (ref >60)
Glucose, Bld: 114 mg/dL — ABNORMAL HIGH (ref 70–99)
Potassium: 4.1 mmol/L (ref 3.5–5.1)
Sodium: 139 mmol/L (ref 135–145)
Total Bilirubin: 0.5 mg/dL (ref 0.3–1.2)
Total Protein: 6.3 g/dL — ABNORMAL LOW (ref 6.5–8.1)

## 2019-05-17 MED ORDER — CALCIUM CARBONATE 1250 (500 CA) MG PO TABS
1.0000 | ORAL_TABLET | Freq: Every day | ORAL | Status: DC
  Administered 2019-05-17 – 2019-05-23 (×7): 500 mg via ORAL
  Filled 2019-05-17 (×7): qty 1

## 2019-05-17 MED ORDER — GABAPENTIN 600 MG PO TABS: 600.0000 mg | ORAL_TABLET | Freq: Two times a day (BID) | ORAL | 3 refills | Status: DC

## 2019-05-17 MED ORDER — HYDROMORPHONE HCL 2 MG PO TABS
1.0000 mg | ORAL_TABLET | Freq: Once | ORAL | Status: AC
  Administered 2019-05-17: 1 mg via ORAL
  Filled 2019-05-17: qty 1

## 2019-05-17 NOTE — H&P (Signed)
Physical Medicine and Rehabilitation Admission H&P    Chief Complaint  Patient presents with  . Functional decline due to pelvic fractures.     HPI: [name]. [name] is a 69 year old female with history of normocytic anemia, TIA's, HTN< cervical neuritis, PE, migraines, fall 2 weeks PTA with subsequent pelvic fractures. She was admitted 05/14/19 for pain control, inability to ambulate and difficulty being managed at home. CT head was negative for acute changes and CT spine showed solid fusion C4/5 C5/6 and C6/7.  She continued to have issues with pain control and mobility. CT lumbar and pelvic spine done for work up and showed chronic compression Fx L1 with <50% deformity and minimally displaced fracture left pubis, left acetabular and left sacrum with extension into left SI joint.  Ortho consulted for input and recommended TTWB LLE. Gabapentin added and titrated upwards. IV dilaudid weaned to oxycodone by 3/12 and sheToradol IV added 3/11.   Review of Systems  Constitutional: Negative for chills and fever.  HENT: Negative for hearing loss and tinnitus.   Eyes: Negative for blurred vision and double vision.  Respiratory: Negative for cough and shortness of breath.   Cardiovascular: Negative for chest pain and leg swelling.  Gastrointestinal: Negative for constipation, heartburn and nausea.  Genitourinary: Negative for dysuria.  Musculoskeletal: Positive for joint pain.  Skin: Negative for itching and rash.  Neurological: Positive for weakness.  Psychiatric/Behavioral: The patient is not nervous/anxious and does not have insomnia.      Past Medical History:  Diagnosis Date  . Anemia   . Cervical neuritis   . Dyslipidemia   . GERD (gastroesophageal reflux disease)   . History of transient ischemic attack (TIA)   . HTN (hypertension)   . Migraine   . Pulmonary embolism ([place]) 2017     Past Surgical History:  Procedure Laterality Date  . CERVICAL FUSION        Family  History  Problem Relation Age of Onset  . Cancer Mother   . Alzheimer's disease Mother   . Prostate cancer Father      Social History:   Married. Independent without AD PTA. She reports that she has quit smoking--10/2011. Smoked 1/3 PPD. She has never used smokeless tobacco. She does not use  alcohol or drugs.    Allergies  Allergen Reactions  . Fish Allergy Anaphylaxis  . Shellfish Allergy Anaphylaxis   Medications Prior to Admission  Medication Sig Dispense Refill  . cetirizine (ZYRTEC) 10 MG tablet Take 10 mg by mouth daily with breakfast.    . clopidogrel (PLAVIX) 75 MG tablet Take 75 mg by mouth daily with breakfast.    . ELDERBERRY PO Take 1 tablet by mouth daily with breakfast.    . furosemide (LASIX) 40 MG tablet Take 40 mg by mouth daily with breakfast.     . gabapentin (NEURONTIN) 600 MG tablet Take 600 mg by mouth in the morning and at bedtime.     Marland Kitchen HYDROcodone-acetaminophen (NORCO/VICODIN) 5-325 MG tablet Take 1 tablet by mouth every 8 (eight) hours as needed (pain).     . Multiple Vitamins-Minerals (HAIR/SKIN/NAILS/BIOTIN) TABS Take 2 tablets by mouth daily with breakfast.    . pantoprazole (PROTONIX) 40 MG tablet Take 40 mg by mouth daily with breakfast.    . propranolol (INDERAL) 10 MG tablet Take 10 mg by mouth 2 (two) times daily with a meal.     . ramipril (ALTACE) 2.5 MG capsule Take 2.5 mg by mouth daily  with breakfast.     . sertraline (ZOLOFT) 25 MG tablet Take 25 mg by mouth at bedtime.    . sertraline (ZOLOFT) 50 MG tablet Take 100 mg by mouth daily with breakfast.    . zonisamide (ZONEGRAN) 100 MG capsule Take 100 mg by mouth in the morning and at bedtime.     Marland Kitchen zonisamide (ZONEGRAN) 25 MG capsule Take 25 mg by mouth 2 (two) times daily as needed (nerve pain).       Drug Regimen Review  Drug regimen was reviewed and remains appropriate with no significant issues identified  Home: Home Living Family/patient expects to be discharged to:: Private  residence Living Arrangements: Spouse/significant other Available Help at Discharge: Family Type of Home: House Home Access: Stairs to enter Technical brewer of Steps: 2 Entrance Stairs-Rails: Right, Left, Can reach both Home Layout: One level Bathroom Toilet: Handicapped height Home Equipment: Environmental consultant - 2 wheels, [place] - single point   Functional History: Prior Function Level of Independence: Independent Comments: prior to fall  Functional Status:  Mobility: Bed Mobility Overal bed mobility: Needs Assistance Bed Mobility: Supine to Sit Supine to sit: Min assist General bed mobility comments: pt OOB in recliner chair upon arrival Transfers Overall transfer level: Needs assistance Equipment used: Rolling walker (2 wheeled) Transfers: Sit to/from Stand Sit to Stand: Min guard Stand pivot transfers: Min guard General transfer comment: good technique utilized, min guard for transition into standing from chair Ambulation/Gait Ambulation/Gait assistance: Min assist, Min guard Gait Distance (Feet): 50 Feet Assistive device: Rolling walker (2 wheeled) Gait Pattern/deviations: Step-to pattern, Decreased stance time - left, Antalgic General Gait Details: pt able to maintain TDWB'ing L LE independently throughout; mostly min guard overall, min A for safety and stability with navigation around obstacles in room Gait velocity: decr    ADL: ADL Overall ADL's : Needs assistance/impaired Eating/Feeding: Set up, Independent, Sitting Grooming: Wash/dry hands, Wash/dry face, Min guard, Standing Upper Body Bathing: Min guard, Sitting Lower Body Bathing: Minimal assistance, Sit to/from stand, +2 for physical assistance, +2 for safety/equipment Upper Body Dressing : Set up, Sitting Lower Body Dressing: Minimal assistance, +2 for physical assistance, +2 for safety/equipment, Sit to/from stand Toilet Transfer: Minimal assistance, Min guard, Ambulation, RW, BSC, Cueing for safety Toilet  Transfer Details (indicate cue type and reason): simulated to the recliner Toileting- Clothing Manipulation and Hygiene: Minimal assistance, Sit to/from stand Toileting - Clothing Manipulation Details (indicate cue type and reason): modA for posterior pericare Functional mobility during ADLs: Minimal assistance, Min guard, Rolling walker, Cueing for safety General ADL Comments: pt reported dizziness with initial sitting;BP in chart (see vital signs section);she required assistance for posterior pericare in standing due to reliance on BUE support on RW;stand pivot to the recliner followed by 5 steps forward;decreased activity tolerance, limited by pain   Cognition: Cognition Overall Cognitive Status: Within Functional Limits for tasks assessed Orientation Level: Oriented X4 Cognition Arousal/Alertness: Awake/alert Behavior During Therapy: WFL for tasks assessed/performed, Anxious Overall Cognitive Status: Within Functional Limits for tasks assessed  Physical Exam: Blood pressure (!) 160/93, pulse 81, temperature 98.4 F (36.9 C), temperature source Oral, resp. rate 18, height 5\' 7"  (1.702 m), weight 81.8 kg, SpO2 97 %.  Physical Exam   General: Alert and oriented x 3, No apparent distress HEENT: Head is normocephalic, atraumatic, PERRLA, EOMI, sclera anicteric, oral mucosa pink and moist, dentition intact, ext ear canals clear,  Neck: Supple without JVD or lymphadenopathy Heart: Reg rate and rhythm. No murmurs rubs or gallops Chest:  CTA bilaterally without wheezes, rales, or rhonchi; no distress Abdomen: Soft, non-tender, non-distended, bowel sounds positive. Extremities: No clubbing, cyanosis, or edema. Pulses are 2+ Skin: Clean and intact without signs of breakdown Neuro: Pt is cognitively appropriate with normal insight, memory, and awareness. Cranial nerves 2-12 are intact. Sensory exam is normal. Reflexes are 2+ in all 4's. Fine motor coordination is intact. No tremors. Motor  function is grossly 5/5 with the exception of left lower extremity- TTWB status.  Psych: Pt's affect is appropriate. Pt is cooperative   Results for orders placed or performed during the hospital encounter of 05/14/19 (from the past 48 hour(s))  Basic metabolic panel     Status: Abnormal   Collection Time: 05/19/19  7:44 AM  Result Value Ref Range   Sodium 140 135 - 145 mmol/L   Potassium 4.1 3.5 - 5.1 mmol/L   Chloride 101 98 - 111 mmol/L   CO2 28 22 - 32 mmol/L   Glucose, Bld 115 (H) 70 - 99 mg/dL    Comment: Glucose reference range applies only to samples taken after fasting for at least 8 hours.   BUN 19 8 - 23 mg/dL   Creatinine, Ser 0.86 0.44 - 1.00 mg/dL   Calcium 8.9 8.9 - 10.3 mg/dL   GFR calc non Af Amer >60 >60 mL/min   GFR calc Af Amer >60 >60 mL/min   Anion gap 11 5 - 15    Comment: Performed at [facility] [address] [place], [place] [address]  CBC     Status: None   Collection Time: 05/19/19  7:44 AM  Result Value Ref Range   WBC 9.9 4.0 - 10.5 K/uL   RBC 4.52 3.87 - 5.11 MIL/uL   Hemoglobin 12.4 12.0 - 15.0 g/dL   HCT 39.5 36.0 - 46.0 %   MCV 87.4 80.0 - 100.0 fL   MCH 27.4 26.0 - 34.0 pg   MCHC 31.4 30.0 - 36.0 g/dL   RDW 14.3 11.5 - 15.5 %   Platelets 309 150 - 400 K/uL   nRBC 0.0 0.0 - 0.2 %    Comment: Performed at [facility], [place] [address], [place], [place] [id number]  Basic metabolic panel     Status: Abnormal   Collection Time: 05/20/19  5:58 AM  Result Value Ref Range   Sodium 133 (L) 135 - 145 mmol/L   Potassium 4.2 3.5 - 5.1 mmol/L   Chloride 89 (L) 98 - 111 mmol/L   CO2 29 22 - 32 mmol/L   Glucose, Bld 108 (H) 70 - 99 mg/dL    Comment: Glucose reference range applies only to samples taken after fasting for at least 8 hours.   BUN 31 (H) 8 - 23 mg/dL   Creatinine, Ser 1.56 (H) 0.44 - 1.00 mg/dL   Calcium 8.9 8.9 - 10.3 mg/dL   GFR calc non Af Amer 34 (L) >60 mL/min   GFR calc Af Amer 39 (L) >60 mL/min   Anion gap 15 5 - 15     Comment: Performed at [facility] [address] [place] [address]  CBC     Status: Abnormal   Collection Time: 05/20/19  5:58 AM  Result Value Ref Range   WBC 13.1 (H) 4.0 - 10.5 K/uL   RBC 4.56 3.87 - 5.11 MIL/uL   Hemoglobin 12.5 12.0 - 15.0 g/dL   HCT 39.9 36.0 - 46.0 %   MCV 87.5 80.0 -  100.0 fL   MCH 27.4 26.0 - 34.0 pg   MCHC 31.3 30.0 - 36.0 g/dL   RDW 14.7 11.5 - 15.5 %   Platelets 351 150 - 400 K/uL   nRBC 0.0 0.0 - 0.2 %    Comment: Performed at [facility], [place] [address], [place], [place] [id number]  Basic metabolic panel     Status: Abnormal   Collection Time: 05/20/19  2:34 PM  Result Value Ref Range   Sodium 132 (L) 135 - 145 mmol/L   Potassium 4.2 3.5 - 5.1 mmol/L   Chloride 89 (L) 98 - 111 mmol/L   CO2 30 22 - 32 mmol/L   Glucose, Bld 113 (H) 70 - 99 mg/dL    Comment: Glucose reference range applies only to samples taken after fasting for at least 8 hours.   BUN 35 (H) 8 - 23 mg/dL   Creatinine, Ser 1.68 (H) 0.44 - 1.00 mg/dL   Calcium 8.7 (L) 8.9 - 10.3 mg/dL   GFR calc non Af Amer 31 (L) >60 mL/min   GFR calc Af Amer 36 (L) >60 mL/min   Anion gap 13 5 - 15    Comment: Performed at [facility] [address] [place],  [address]   No results found.  Medical Problem List and Plan: 1. Fractures to left inferior and superior pubic ramis  -patient may shower but incision must be covered.  -ELOS/Goals: modI in PT, OT, I in SLP for 5-7 days 2.  Antithrombotics: -DVT/anticoagulation:  Pharmaceutical: Lovenox  -antiplatelet therapy: Plavix 3. Pain Management: ON tylenol 1000 mg qid-->decrease due to age, baclofen 5 mg tid, gabapentin 600 mg bid/900 mg hs, oxycodone 10 mg qid, Oxycontin 10mg  BID, Ice prn  3/15: Pain is currently well controlled with this regimen. Reports 5/10 pain.  4. Mood: LCSW to follow for evaluation and support  -antipsychotic agents: N/A 5. Neuropsych: This patient is capable of making decisions  on her own behalf. 6. Skin/Wound Care: Routine pressure relief measures.  7. Fluids/Electrolytes/Nutrition: Monitor I/O. Check lytes in am.  8. HTN: Monitor BP tid--continue Lasix. Has been elevated. Add Amlodipine 2.5mg  daily.  9. H/o migraines: On Zoloft, Zonegran and inderal. 10. Leucocytosis: Monitor for signs of infection. 11. Acute renal failure/Hyponateremia: BUN/SCr 20/1.10 at admission-->35/1.68.Has trended up with addition of Toradol> will d/c.  12. Constipation: On Senna and miralax.   Bary Leriche, PA-C 05/20/2019   I have personally performed a face to face diagnostic evaluation, including, but not limited to relevant history and physical exam findings, of this patient and developed relevant assessment and plan.  Additionally, I have reviewed and concur with the physician assistant's documentation above.  Leeroy Cha, MD

## 2019-05-17 NOTE — Progress Notes (Signed)
  Inpatient Rehab Admissions:  Inpatient Rehab Consult received.  I met with pt at the bedside (with husband on speaker phone) for rehabilitation assessment. Discussed recommended rehab program details, including expectations, anticipated LOS, and expected functional outcomes. Feel at this time, pt is an appropriate CIR candidate. Pt and her husband interested in IP Rehab once medically ready. Spoke to attending service who indicated medical workup is not yet complete and pt not ready for DC to CIR today. AC will continue to follow along for medical readiness and functional need for IP Rehab.   Please call if questions.   Raechel Ache, OTR/L  Rehab Admissions Coordinator  [phone] 05/17/2019 11:19 AM

## 2019-05-17 NOTE — Progress Notes (Signed)
Family Medicine Teaching Service Daily Progress Note Intern Pager: [phone]  Patient name: [name] Medical record number: [id number] Date of birth: [birth year] Age: 69 y.o. Gender: female  Primary Care Provider: Richarda Osmond, DO Consultants: none Code Status: dnr/dni  Pt Overview and Major Events to Date:  3/6 admitted to fpts  Assessment and Plan: [name] is a 69 y.o. female presenting with 2-week history of left hip pain from known pubic rami fracture.  Unable to ambulate at home, not felt safe to go home.  Left hip pain/fracture to left inferior and superior pubic rami Seen by overnight team and pain was 8/10 severity. Reported shooting pains radiating down left side of leg. Given good response to 2 mg dilaudid during the day, prescribed 1-2mg  dilaudid every 6 hours and discontinued oxycodone. This morning pain improved with dilaudid 1mg  Q6H overnight to 3/10 severity. -PT/OT: 24 hr assistance-hopeful for CIR, pt does not want SNF -Social work to assist with placement once eval performed -Tylenol 1000mg  Q6H -Lovenox for DVT prophylaxis -Ortho following, appreciate recommendations-will ask about options for further pain management as pain in left hip and leg is not well controlled ?for regional block/tens and is there something else that could be causing her pain -Continue Vitamin D: 50,000 units every 7 days started on 3/7  -Started calcium supplementation today   Normocytic anemia Hemoglobin 11.6, 11.4>10.8>10.9.  MCV 90. Likely 2/2 secondary to Plavix and bleeding into pelvis from hip fracture. We will continue to monitor but given stability even after dose of Lovenox, unlikely to be a acute bleed at this point. -Lovenox for DVT prophylaxis -Daily CBC -Monitor for any signs symptoms of bleeding  History of TIA No symptoms this point.  Can follow-up with neurology or PCP discuss necessity of Plavix going forward.  History of migraines Denies  headache. Currently taking propranolol, Zoloft, zonergan.  -Continue Zoloft 100 mg daily.  -Continue Zonergan 100 mg twice daily -Propranolol 10 mg twice daily  Hyperlipidemia Currently receiving Repatha 140 mg every 2 weeks.  Next lipid panel due in about 3 months, can follow PCP for this issue.  Hypertension BP 134/88. Home meds: propranolol, furosemide, ramipril -Continue propranolol 10 mg twice daily -Continue furosemide 40 mg daily -Continue ramipril 2.5 mg daily  CKD stage IIIa Creatinine 1.18 today, 1.16 on 3/8 -Likely get BMP every other day or so while admitted -Can discontinue ramipril if GFR decreases.  History of pulmonary embolus History of PE back in 2017. Was on Eliquis for period time, but this was discontinued years ago.  Monitor for any signs and symptoms of shortness of breath, chest pain, tachycardia while admitted.  GERD Asymptomatic -Continue Protonix 40 mg daily.  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  Taking gabapentin 600 mg 3 times daily for this.  We will continue while admitted.  FEN/GI: regular PPx: lovenox  Disposition: Likely CIR, pending clinical course  Subjective:  Pain better controlled in left leg and hip with dilaudid. Pt appeared in better spirits today.    Objective: Temp:  [97.8 F (36.6 C)-98 F (36.7 C)] 97.8 F (36.6 C) (03/09 0745) Pulse Rate:  [76-88] 76 (03/09 0745) Resp:  [14-17] 14 (03/09 0745) BP: (134-168)/(80-89) 134/88 (03/09 0745) SpO2:  [94 %-96 %] 96 % (03/09 0745)    Physical Exam:  General: Alert, pleasant 69 yr old female, no acute distress Cardio: Normal S1 and S2, RRR. No murmurs or rubs.   Pulm: CTAB,  normal WOB  Abdomen: Bowel sounds normal. Abdomen soft and non-tender.  Extremities: No peripheral edema. Warm/ well perfused.  Strong radial pulse Neuro: Cranial nerves grossly intact  Laboratory: Recent Labs  Lab 05/15/19 0503 05/16/19 0247  05/17/19 0400  WBC 7.6 8.8 8.7  HGB 10.8* 11.4* 11.6*  HCT 34.5* 35.8* 37.0  PLT 249 274 302   Recent Labs  Lab 05/14/19 1218 05/16/19 0247 05/17/19 0400  NA 138 142 139  K 4.1 5.3* 4.1  CL 105 103 98  CO2 24 30 29   BUN 20 13 19   CREATININE 1.10* 1.16* 1.18*  CALCIUM 8.4* 9.2 9.2  PROT  --   --  6.3*  BILITOT  --   --  0.5  ALKPHOS  --   --  162*  ALT  --   --  29  AST  --   --  26  GLUCOSE 129* 114* 114*    Imaging/Diagnostic Tests: No new imaging  Courtney Haw, MD 05/17/2019, 7:51 AM PGY-1, [place] Intern pager: [phone], text pages welcome

## 2019-05-17 NOTE — Progress Notes (Signed)
Spoke with Dr. Charm Rings regarding patient's on going pain secondary to fracture. He recommended nonoperative management with touchdown weightbearing (25% weightbearing on right extremity) and also transfers on right side.  He also recommended ice, muscle relaxants and ongoing analgesia.  He explained that patient may be in pain for quite some time due to the nature of the fracture.  He said he would have his PA see Ms. Depasquale today.   Lattie Haw  PGY1, [facility]

## 2019-05-17 NOTE — Plan of Care (Signed)
Plan of care reviewed with pt and family at bedside. Pt up with PT to chair. Pain medications given in anticipation, pain 9/10. CIR consulted.  VSS, will continue to monitor pt. Pt denies needs at this time.  Problem: Education: Goal: Knowledge of General Education information will improve Description: Including pain rating scale, medication(s)/side effects and non-pharmacologic comfort measures Outcome: Progressing   Problem: Health Behavior/Discharge Planning: Goal: Ability to manage health-related needs will improve Outcome: Progressing   Problem: Clinical Measurements: Goal: Ability to maintain clinical measurements within normal limits will improve Outcome: Progressing Goal: Will remain free from infection Outcome: Progressing Goal: Diagnostic test results will improve Outcome: Progressing Goal: Respiratory complications will improve Outcome: Progressing Goal: Cardiovascular complication will be avoided Outcome: Progressing   Problem: Activity: Goal: Risk for activity intolerance will decrease Outcome: Progressing   Problem: Nutrition: Goal: Adequate nutrition will be maintained Outcome: Progressing   Problem: Coping: Goal: Level of anxiety will decrease Outcome: Progressing   Problem: Elimination: Goal: Will not experience complications related to bowel motility Outcome: Progressing Goal: Will not experience complications related to urinary retention Outcome: Progressing   Problem: Pain Managment: Goal: General experience of comfort will improve Outcome: Progressing   Problem: Safety: Goal: Ability to remain free from injury will improve Outcome: Progressing   Problem: Skin Integrity: Goal: Risk for impaired skin integrity will decrease Outcome: Progressing

## 2019-05-17 NOTE — TOC Transition Note (Signed)
Transition of Care [facility]) - CM/SW Discharge Note   Patient Details  Name: LYNNETTE CASTLES MRN: [id number] Date of Birth: 1950/11/15  Transition of Care [facility]) CM/SW Contact:  Curlene Labrum, RN Phone Number: 05/17/2019, 2:42 PM   Clinical Narrative:     Patient is 69 year old female that presented to [facility] for fall and left hip pain for the past 2 weeks.  Patient lives with her husband.  Both the patient and the husband drives.  Patient is pending admission to CIR according to the PT/OT ane the patient.  Patient has cane, high toilet, shower bar, but needs new rolling walker.  Patient states her walker is old and belonged to another family member.  Will order Rolling walker - provided choice to the patient and [employer] to deliver RW to room.  Patient has PCP with [facility] and [facility]. She also has medications filled at the [facility] on [address]. In [place], [place].  No other needs were identified for transition to CIR.    Final next level of care: IP Rehab Facility Barriers to Discharge: Barriers Resolved   Patient Goals and CMS Choice Patient states their goals for this hospitalization and ongoing recovery are:: I'm ready to be out of pain with this hip.      Discharge Placement                       Discharge Plan and Services   Discharge Planning Services: CM Consult Post Acute Care Choice: Durable Medical Equipment                               Social Determinants of Health (SDOH) Interventions     Readmission Risk Interventions No flowsheet data found.

## 2019-05-17 NOTE — Progress Notes (Signed)
Physical Therapy Treatment Patient Details Name: [name] MRN: [id number] DOB: 11-Oct-1950 Today's Date: 05/17/2019    History of Present Illness [name] is a 69 y.o. female presenting 05/14/19 with 2-week history of left hip pain from known multiple left pubic rami and left sacral fractures after fall.  (Of note, was seen by Dr Maureen Ralphs in his office after fall). Progressively became unable to ambulate at home due to incr pain, and presented to hospital. PMH is significant for HTN, migraine, H/O TIA (on anticoagulation), h/o pulmonary embolism, PVD, OA of knee, CKD stage III, anxiety    PT Comments    Continuing work on functional mobility and activity tolerance;  Session focused on functional mobility within a bathroom ADL, and then trying progressive amb; Able to accept more weight onto LLE in stance, and still with good use of RW for support; Good progress towards goals;   Also pt and family member with questions about whether to immobilize her LLE for healing, asking about a boot perhaps to keep her feet from rubbing each other, and making her LLE/hip move; These type of fractures are typically not immobilized; We discussed options for this concern -- she has good ankle and knee ROM, and hip ROM is sore, but improving daily; educated pt in performing self-AAROM hip and knee for the benefits of joint motion, and a more targeted approach to her L hip movement; Briefly discussed a PRAFO boot with Dr. Posey Pronto at beginning of session, but I don't feel it is indicated;    Continue to recommend comprehensive inpatient rehab (CIR) for post-acute therapy needs. Overall progressing well; Anticipate continuing good progress at post-acute rehabilitation.   Follow Up Recommendations  CIR     Equipment Recommendations  None recommended by PT    Recommendations for Other Services Rehab consult     Precautions / Restrictions Precautions Precautions: Fall Restrictions Weight Bearing  Restrictions: Yes LLE Weight Bearing: Weight bearing as tolerated    Mobility  Bed Mobility Overal bed mobility: Needs Assistance Bed Mobility: Supine to Sit     Supine to sit: Min guard     General bed mobility comments: Minguard for safety, increased time and effort, and she used a belt to self-assist her LLE off of teh bed  Transfers Overall transfer level: Needs assistance Equipment used: Rolling walker (2 wheeled) Transfers: Sit to/from Stand Sit to Stand: Min guard         General transfer comment: Minguard for safety, especially with trasnition of hands from bed to RW; cues for hand placement  Ambulation/Gait Ambulation/Gait assistance: Min assist;+2 safety/equipment(chair push) Gait Distance (Feet): 20 Feet(including some hallway amb) Assistive device: Rolling walker (2 wheeled) Gait Pattern/deviations: Step-to pattern;Decreased stance time - left;Antalgic(noting step-through emerging)     General Gait Details: Cues for gait sequence, and to use RW to take pressure off of painful LLE in stance; able to get her whole L foot down in stance today   Stairs             Wheelchair Mobility    Modified Rankin (Stroke Patients Only)       Balance     Sitting balance-Leahy Scale: Fair Sitting balance - Comments: better midline posture and positioning today     Standing balance-Leahy Scale: Poor(approaching Fair) Standing balance comment: heavy reliance on BUE support in standing  Cognition Arousal/Alertness: Awake/alert Behavior During Therapy: WFL for tasks assessed/performed;Anxious Overall Cognitive Status: Within Functional Limits for tasks assessed                                        Exercises Other Exercises Other Exercises: Self-AAROM Hellslides with belt x10 Other Exercises: Self-AAROM straight leg raises x10 with belt Other Exercises: Self-AAROM hip abduction/adduction with belt  x10    General Comments        Pertinent Vitals/Pain Pain Assessment: Faces Faces Pain Scale: Hurts even more Pain Location: left hip down to foot Pain Descriptors / Indicators: Stabbing;Grimacing;Crying Pain Intervention(s): Monitored during session    Home Living                      Prior Function            PT Goals (current goals can now be found in the care plan section) Acute Rehab PT Goals Patient Stated Goal: I want to stay here for therapy and then go home PT Goal Formulation: With patient Time For Goal Achievement: 05/29/19 Potential to Achieve Goals: Good Progress towards PT goals: Progressing toward goals    Frequency    Min 4X/week      PT Plan Current plan remains appropriate    Co-evaluation              AM-PAC PT "6 Clicks" Mobility   Outcome Measure  Help needed turning from your back to your side while in a flat bed without using bedrails?: A Little Help needed moving from lying on your back to sitting on the side of a flat bed without using bedrails?: None Help needed moving to and from a bed to a chair (including a wheelchair)?: A Little Help needed standing up from a chair using your arms (e.g., wheelchair or bedside chair)?: A Little Help needed to walk in hospital room?: A Little Help needed climbing 3-5 steps with a railing? : A Lot 6 Click Score: 18    End of Session Equipment Utilized During Treatment: Gait belt Activity Tolerance: Patient tolerated treatment well Patient left: in chair;with call bell/phone within reach;with chair alarm set Nurse Communication: Mobility status PT Visit Diagnosis: Difficulty in walking, not elsewhere classified (R26.2);Pain Pain - Right/Left: Left Pain - part of body: Hip;Leg     Time: [id number] PT Time Calculation (min) (ACUTE ONLY): 38 min  Charges:  $Gait Training: 23-37 mins $Therapeutic Exercise: 8-22 mins                     [name], PT  Acute Rehabilitation  Services Pager [phone] Office [phone]    [name] 05/17/2019, 11:21 AM

## 2019-05-17 NOTE — PMR Pre-admission (Signed)
PMR Admission Coordinator Pre-Admission Assessment  Patient: [name] is an 69 y.o., female MRN: [id number] DOB: [birth year] Height: _0  (170.2 cm) Weight: 81.8 kg  Insurance Information HMO:     PPO:      PCP:      IPA:      80/20: yes     OTHER:  PRIMARY: medicare A and B      Policy#: [id number]      Subscriber: Patient CM Name:       Phone#:      Fax#:  Pre-Cert#:       Employer:  Benefits:  Phone #: online     Name: verified eligibility online via [place] on 05/17/19 Eff. Date: Part A and B effective 03/10/16     Deduct: $1,484      Out of Pocket Max: NA      Life Max: NA CIR: Covered per Medicare guidelines once yearly deductible has been met      SNF: days 1-20, 100%, days 21-100, 80% Outpatient: 80%     Co-Pay: 20% Home Health: 100%      Co-Pay:  DME: 80%     Co-Pay: 20% Providers: Pt's choice SECONDARY: [employer]      Policy#: [id number]      Subscriber: Patient CM Name:       Phone#:      Fax#:  Pre-Cert#:       Employer:  Benefits:  Phone #: [phone]     Name:  Eff. Date:      Deduct:       Out of Pocket Max:       Life Max:  CIR:       SNF: Outpatient:     Co-Pay: Home Health:      Co-Pay:  DME:     Co-Pay:   Medicaid Application Date:       Case Manager:  Disability Application Date:       Case Worker:   The "Data Collection Information Summary" for patients in Inpatient Rehabilitation Facilities with attached "Privacy Act [place] Records" was provided and verbally reviewed with: Patient and Family  Emergency Contact Information Contact Information    Name Relation Home Work Carlton, danny Brother   [phone]   laterrica, libman Spouse   [phone]   ELLAJANE, STONG Daughter   [phone]      Current Medical History  Patient Admitting Diagnosis: Functional debility due to multiple falls with multiple medical issues.   History of Present Illness: Pt is a 69 yo female with history of HTN, migraine, TIA, PE, PVD,  GERD, OA of knee, CKD stage III, anxiety, and previous cervical fusion. The pt was admitted to the hospital on 05/14/19 with a 2 week history of left hip pain from  known pubic rami and sacratl fractures after a fall. She was told her fracture was non-operative. Pt was unable to ambulate safely at home and per pt had another fall that ultimately brought her in for assistance. Ortho was consulted who again recommended nonoperative management of her fractures but did change her to TTWB LLE. Pt has been having difficulty with pain control and throughout her hospital stay has been requiring IV dialudid and eventual PCA Dilaudid due to persistent and worsening pain. Pt has been followed for normocytic anemia with attending service monitoring for symptoms of bleeding. She has also shown to have HTN most likely due to significant pain. Pt also with AKI on CKD stage  III most likely due to toradol use with attending service managing fluids and monitoring daily BMP. Pt has been evaluated by therapies with recommendation for CIR. Pt is to admit to CIR on 05/23/19.     Patient's medical record from [facility] has been reviewed by the rehabilitation admission coordinator and physician.  Past Medical History  Past Medical History:  Diagnosis Date  . Anemia   . Cervical neuritis   . Dyslipidemia   . GERD (gastroesophageal reflux disease)   . History of transient ischemic attack (TIA)   . HTN (hypertension)   . Migraine   . Pulmonary embolism ([place]) 2017    Family History   family history includes Alzheimer's disease in her mother; Cancer in her mother; Prostate cancer in her father.  Prior Rehab/Hospitalizations Has the patient had prior rehab or hospitalizations prior to admission? No  Has the patient had major surgery during 100 days prior to admission? No   Current Medications  Current Facility-Administered Medications:  .  alum & mag hydroxide-simeth (MAALOX/MYLANTA) 200-200-20 MG/5ML  suspension 5 mL, 5 mL, Oral, Q6H PRN, McDiarmid, Blane Ohara, MD, 5 mL at 05/20/19 1411 .  baclofen (LIORESAL) tablet 5 mg, 5 mg, Oral, TID, Gladys Damme, MD, 5 mg at 05/23/19 0827 .  calcium carbonate (OS-CAL - dosed in mg of elemental calcium) tablet 500 mg of elemental calcium, 1 tablet, Oral, Q breakfast, Posey Pronto, Poonam, MD, 500 mg of elemental calcium at 05/23/19 0827 .  clopidogrel (PLAVIX) tablet 75 mg, 75 mg, Oral, Q breakfast, Guadalupe Dawn, MD, 75 mg at 05/23/19 0826 .  diclofenac Sodium (VOLTAREN) 1 % topical gel 2 g, 2 g, Topical, QID, Gladys Damme, MD, 2 g at 05/23/19 1043 .  enoxaparin (LOVENOX) injection 40 mg, 40 mg, Subcutaneous, Q24H, Guadalupe Dawn, MD, 40 mg at 05/22/19 2105 .  gabapentin (NEURONTIN) capsule 600 mg, 600 mg, Oral, BID, 600 mg at 05/23/19 0827 **AND** gabapentin (NEURONTIN) capsule 900 mg, 900 mg, Oral, QHS, Gladys Damme, MD, 900 mg at 05/22/19 2105 .  oxyCODONE-acetaminophen (PERCOCET/ROXICET) 5-325 MG per tablet 1 tablet, 1 tablet, Oral, Q4H PRN **AND** oxyCODONE (Oxy IR/ROXICODONE) immediate release tablet 5 mg, 5 mg, Oral, Q6H PRN, Anderson, Chelsey L, DO .  oxyCODONE (OXYCONTIN) 12 hr tablet 10 mg, 10 mg, Oral, Q12H, Gladys Damme, MD, 10 mg at 05/23/19 1042 .  pantoprazole (PROTONIX) EC tablet 40 mg, 40 mg, Oral, Q breakfast, Guadalupe Dawn, MD, 40 mg at 05/23/19 0831 .  polyethylene glycol (MIRALAX / GLYCOLAX) packet 17 g, 17 g, Oral, Daily, Lattie Haw, MD, 17 g at 05/23/19 0826 .  propranolol (INDERAL) tablet 10 mg, 10 mg, Oral, BID WC, Guadalupe Dawn, MD, 10 mg at 05/23/19 0017 .  [START ON 05/24/2019] ramipril (ALTACE) capsule 2.5 mg, 2.5 mg, Oral, Q breakfast, Anderson, Chelsey L, DO .  senna (SENOKOT) tablet 8.6 mg, 1 tablet, Oral, Daily, Anderson, Chelsey L, DO, 8.6 mg at 05/23/19 0827 .  sertraline (ZOLOFT) tablet 100 mg, 100 mg, Oral, Q breakfast, Guadalupe Dawn, MD, 100 mg at 05/23/19 0827 .  Vitamin D (Ergocalciferol) (DRISDOL)  capsule 50,000 Units, 50,000 Units, Oral, Q7 days, Meccariello, Bernita Raisin, DO, 50,000 Units at 05/22/19 1653 .  zonisamide (ZONEGRAN) capsule 100 mg, 100 mg, Oral, BID, Guadalupe Dawn, MD, 100 mg at 05/23/19 4944  Patients Current Diet:  Diet Order            Diet regular Room service appropriate? Yes; Fluid consistency: Thin  Diet effective now  Precautions / Restrictions Precautions Precautions: Fall Restrictions Weight Bearing Restrictions: Yes LLE Weight Bearing: Touchdown weight bearing Other Position/Activity Restrictions: per Ortho   Has the patient had 2 or more falls or a fall with injury in the past year? Yes  Prior Activity Level Community (5-7x/wk): working as Charity fundraiser, drove PTA, Independent PTA. no AD use before fall two weeks ago (began using RW and [facility] for mobility due to decreased functional ambuation status)  Prior Functional Level Self Care: Did the patient need help bathing, dressing, using the toilet or eating? Independent  Indoor Mobility: Did the patient need assistance with walking from room to room (with or without device)? Independent  Stairs: Did the patient need assistance with internal or external stairs (with or without device)? Independent  Functional Cognition: Did the patient need help planning regular tasks such as shopping or remembering to take medications? [place] / Equipment Home Assistive Devices/Equipment: Eyeglasses, Radio producer (specify quad or straight), Walker (specify type), Brace (specify type) Home Equipment: Walker - 2 wheels, Cane - single point  Prior Device Use: Indicate devices/aids used by the patient prior to current illness, exacerbation or injury? none prior to 2 weeks ago (used RW/SPC after first fall)  Current Functional Level Cognition  Overall Cognitive Status: Within Functional Limits for tasks assessed Orientation Level: Oriented X4    Extremity Assessment (includes  Sensation/Coordination)  Upper Extremity Assessment: Overall WFL for tasks assessed  Lower Extremity Assessment: Defer to PT evaluation RLE Deficits / Details: +pain in left pelvis with supineAROM hip/knee flexion 90/90, ankle DF 5/5 RLE: Unable to fully assess due to pain RLE Sensation: WNL LLE Deficits / Details: supine AROM hip/knee flexion 60, ankle ROM WNL; could not tolerate ankle DF MMT LLE: Unable to fully assess due to pain LLE Sensation: (sensory changes L4-5 distribution)    ADLs  Overall ADL's : Needs assistance/impaired Eating/Feeding: Set up, Independent, Sitting Grooming: Wash/dry hands, Dance movement psychotherapist, Min guard, Standing Upper Body Bathing: Min guard, Sitting Lower Body Bathing: Minimal assistance, Sit to/from stand, +2 for physical assistance, +2 for safety/equipment Upper Body Dressing : Set up, Sitting Lower Body Dressing: Minimal assistance, +2 for physical assistance, +2 for safety/equipment, Sit to/from stand Toilet Transfer: Minimal assistance, Min guard, Ambulation, RW, BSC, Cueing for safety Toilet Transfer Details (indicate cue type and reason): simulated to the recliner Toileting- Clothing Manipulation and Hygiene: Minimal assistance, Sit to/from stand Toileting - Clothing Manipulation Details (indicate cue type and reason): modA for posterior pericare Functional mobility during ADLs: Minimal assistance, Min guard, Rolling walker, Cueing for safety General ADL Comments: pt reported dizziness with initial sitting;BP in chart (see vital signs section);she required assistance for posterior pericare in standing due to reliance on BUE support on RW;stand pivot to the recliner followed by 5 steps forward;decreased activity tolerance, limited by pain     Mobility  Overal bed mobility: Needs Assistance Bed Mobility: Supine to Sit Supine to sit: HOB elevated, Supervision General bed mobility comments: HOB 40 degrees with rail to pivot to left. Pt declined attempting  with bed flat    Transfers  Overall transfer level: Needs assistance Equipment used: Rolling walker (2 wheeled) Transfers: Sit to/from Stand Sit to Stand: Supervision Stand pivot transfers: Min guard General transfer comment: good hand placement and stabilization. cues for toe out with turning to sit to decrease pain    Ambulation / Gait / Stairs / Wheelchair Mobility  Ambulation/Gait Ambulation/Gait assistance: Counsellor (Feet): 90 Feet Assistive device: Rolling  walker (2 wheeled) Gait Pattern/deviations: Step-to pattern General Gait Details: pt with maintained TDWB LLE throughout with fatigue limited by increasing pain with gait up to 9/10 in left hip with distance per pt and pt self directing distance Gait velocity: decr Gait velocity interpretation: 1.31 - 2.62 ft/sec, indicative of limited community ambulator Stairs: (pt declined attempting)    Posture / Balance Dynamic Sitting Balance Sitting balance - Comments: better midline posture and positioning today Balance Overall balance assessment: Needs assistance Sitting-balance support: Feet supported Sitting balance-Leahy Scale: Good Sitting balance - Comments: better midline posture and positioning today Standing balance support: Bilateral upper extremity supported, During functional activity Standing balance-Leahy Scale: Fair Standing balance comment: Briefly stood at sink; Essentially stayed in R single limb stance, with L toes touching down    Special needs/care consideration BiPAP/CPAP : no CPM : no Continuous Drip IV : no Dialysis : no        Days : no Life Vest : no Oxygen : no Special Bed : no Trach Size : no Wound Vac (area) : no      Location : no Skin : ecchymosis to left arm                           Bowel mgmt: last charted 05/21/19 Bladder mgmt: continent, external urinary catheter in place Diabetic mgmt: no Behavioral consideration : no Chemo/radiation : no   Previous Home Environment  (from acute therapy documentation) Living Arrangements: Spouse/significant other Available Help at Discharge: Family Type of Home: House Home Layout: One level Home Access: Stairs to enter Entrance Stairs-Rails: Right, Left, Can reach both Entrance Stairs-Number of Steps: 2 Bathroom Toilet: Handicapped height Home Care Services: No  Discharge Living Setting Plans for Discharge Living Setting: Patient's home, Lives with (comment)(lives with husband ) Type of Home at Discharge: House Discharge Home Layout: One level Discharge Home Access: Stairs to enter Entrance Stairs-Rails: Right Entrance Stairs-Number of Steps: 2 steps to get onto porch and 1 step for thershold into house Discharge Bathroom Shower/Tub: Tub/shower unit(has handrail) Discharge Bathroom Toilet: Handicapped height Discharge Bathroom Accessibility: Yes How Accessible: Accessible via walker Does the patient have any problems obtaining your medications?: No  Social/Family/Support Systems Patient Roles: Spouse, Other (Comment)(private duty sitter ) Contact Information: Mikeal Hawthorne (husband): [phone] Anticipated Caregiver: Mikeal Hawthorne (husband) Anticipated Caregiver's Contact Information: see above Ability/Limitations of Caregiver: Supervision  Caregiver Availability: 24/7 Discharge Plan Discussed with Primary Caregiver: Yes Is Caregiver In Agreement with Plan?: Yes Does Caregiver/Family have Issues with Lodging/Transportation while Pt is in Rehab?: No  Goals/Additional Needs Patient/Family Goal for Rehab: PT/OT: Mod I; SLP: NA Expected length of stay: 5-7 days Cultural Considerations: [employer] Dietary Needs: regular diet, thin liquids  Equipment Needs: TBD Special Service Needs: NA Pt/Family Agrees to Admission and willing to participate: Yes Program Orientation Provided & Reviewed with Pt/Caregiver Including Roles  & Responsibilities: Yes(pt and her husband)  Barriers to Discharge: Home environment access/layout,  Lack of/limited family support  Barriers to Discharge Comments: steps to enter, husband can only do Supervision (no hands on assistance due to back issues).   Decrease burden of Care through IP rehab admission: NA   Possible need for SNF placement upon discharge: Not anticipated. Anticipate can reach Mod I level after CIR stay and with improved pain control. Pt's husband is able to provide some Supervision as needed.   Patient Condition: I have reviewed medical records from [facility], spoken with RN, and  patient and spouse. I met with patient at the bedside for inpatient rehabilitation assessment.  Patient will benefit from ongoing PT and OT, can actively participate in 3 hours of therapy a day 5 days of the week, and can make measurable gains during the admission.  Patient will also benefit from the coordinated team approach during an Inpatient Acute Rehabilitation admission.  The patient will receive intensive therapy as well as Rehabilitation physician, nursing, social worker, and care management interventions.  Due to safety, skin/wound care, disease management, medication administration, pain management and patient education the patient requires 24 hour a day rehabilitation nursing.  The patient is currently Min G with mobility and Min A/G for basic ADLs.  Discharge setting and therapy post discharge at home with home health is anticipated.  Patient has agreed to participate in the Acute Inpatient Rehabilitation Program and will admit today.  Preadmission Screen Completed By:  Raechel Ache, 05/23/2019 12:30 PM ______________________________________________________________________   Discussed status with Dr. Ranell Patrick on 05/23/19 at 12:29PM and received approval for admission today.  Admission Coordinator:  Raechel Ache, OT, time 12:29PM/Date 05/23/19   Assessment/Plan: Diagnosis: Pelvic fractures 1. Does the need for close, 24 hr/day Medical supervision in concert with the  patient's rehab needs make it unreasonable for this patient to be served in a less intensive setting? Yes 2. Co-Morbidities requiring supervision/potential complications: normocytic anemia, TIAs, cervical neuritis, PE, migraines, fall 2 weeks prior to admission 3. Due to bladder management, bowel management, safety, skin/wound care, disease management, medication administration, pain management and patient education, does the patient require 24 hr/day rehab nursing? Yes 4. Does the patient require coordinated care of a physician, rehab nurse, PT, OT, and SLP to address physical and functional deficits in the context of the above medical diagnosis(es)? Yes Addressing deficits in the following areas: balance, endurance, locomotion, strength, transferring, bowel/bladder control, bathing, dressing, feeding, grooming, toileting and psychosocial support 5. Can the patient actively participate in an intensive therapy program of at least 3 hrs of therapy 5 days a week? Yes 6. The potential for patient to make measurable gains while on inpatient rehab is excellent 7. Anticipated functional outcomes upon discharge from inpatient rehab: modified independent PT, modified independent OT, independent SLP 8. Estimated rehab length of stay to reach the above functional goals is: 5-7 days 9. Anticipated discharge destination: Home 10. Overall Rehab/Functional Prognosis: excellent   MD Signature: Leeroy Cha, MD

## 2019-05-18 ENCOUNTER — Inpatient Hospital Stay (HOSPITAL_COMMUNITY): Payer: Medicare Other

## 2019-05-18 DIAGNOSIS — M5417 Radiculopathy, lumbosacral region: Secondary | ICD-10-CM

## 2019-05-18 LAB — CBC
HCT: 36.8 % (ref 36.0–46.0)
Hemoglobin: 11.8 g/dL — ABNORMAL LOW (ref 12.0–15.0)
MCH: 27.7 pg (ref 26.0–34.0)
MCHC: 32.1 g/dL (ref 30.0–36.0)
MCV: 86.4 fL (ref 80.0–100.0)
Platelets: 298 10*3/uL (ref 150–400)
RBC: 4.26 MIL/uL (ref 3.87–5.11)
RDW: 14.2 % (ref 11.5–15.5)
WBC: 11.1 10*3/uL — ABNORMAL HIGH (ref 4.0–10.5)
nRBC: 0 % (ref 0.0–0.2)

## 2019-05-18 LAB — BASIC METABOLIC PANEL
Anion gap: 10 (ref 5–15)
BUN: 22 mg/dL (ref 8–23)
CO2: 29 mmol/L (ref 22–32)
Calcium: 9.2 mg/dL (ref 8.9–10.3)
Chloride: 102 mmol/L (ref 98–111)
Creatinine, Ser: 1.25 mg/dL — ABNORMAL HIGH (ref 0.44–1.00)
GFR calc Af Amer: 52 mL/min — ABNORMAL LOW (ref >60)
GFR calc non Af Amer: 44 mL/min — ABNORMAL LOW (ref >60)
Glucose, Bld: 113 mg/dL — ABNORMAL HIGH (ref 70–99)
Potassium: 4.1 mmol/L (ref 3.5–5.1)
Sodium: 141 mmol/L (ref 135–145)

## 2019-05-18 MED ORDER — DIPHENHYDRAMINE HCL 12.5 MG/5ML PO ELIX: 12.5000 mg | ORAL_SOLUTION | Freq: Four times a day (QID) | ORAL | Status: DC | PRN

## 2019-05-18 MED ORDER — ONDANSETRON HCL 4 MG/2ML IJ SOLN: 4.0000 mg | Freq: Four times a day (QID) | INTRAMUSCULAR | Status: DC | PRN

## 2019-05-18 MED ORDER — SODIUM CHLORIDE 0.9% FLUSH: 9.0000 mL | INTRAVENOUS | Status: DC | PRN

## 2019-05-18 MED ORDER — DIPHENHYDRAMINE HCL 50 MG/ML IJ SOLN: 12.5000 mg | Freq: Four times a day (QID) | INTRAMUSCULAR | Status: DC | PRN

## 2019-05-18 MED ORDER — GABAPENTIN 300 MG PO CAPS
900.0000 mg | ORAL_CAPSULE | Freq: Every day | ORAL | Status: DC
  Administered 2019-05-18 – 2019-05-22 (×5): 900 mg via ORAL
  Filled 2019-05-18 (×5): qty 3

## 2019-05-18 MED ORDER — GABAPENTIN 300 MG PO CAPS
600.0000 mg | ORAL_CAPSULE | Freq: Two times a day (BID) | ORAL | Status: DC
  Administered 2019-05-18 – 2019-05-23 (×11): 600 mg via ORAL
  Filled 2019-05-18 (×11): qty 2

## 2019-05-18 MED ORDER — HYDROMORPHONE HCL 2 MG/ML IJ SOLN
2.0000 mg | Freq: Once | INTRAMUSCULAR | Status: AC
  Administered 2019-05-18: 14:00:00 2 mg via INTRAVENOUS
  Filled 2019-05-18: qty 1

## 2019-05-18 MED ORDER — HYDROMORPHONE 1 MG/ML IV SOLN
INTRAVENOUS | Status: DC
  Administered 2019-05-18: 1.58 mg via INTRAVENOUS
  Administered 2019-05-19: 0.884 mg via INTRAVENOUS
  Administered 2019-05-19: 0.743 mg via INTRAVENOUS
  Administered 2019-05-19: 0.793 mg via INTRAVENOUS
  Filled 2019-05-18: qty 30

## 2019-05-18 MED ORDER — NALOXONE HCL 0.4 MG/ML IJ SOLN: 0.4000 mg | INTRAMUSCULAR | Status: DC | PRN

## 2019-05-18 NOTE — Progress Notes (Signed)
Physical Therapy Treatment Patient Details Name: [name] MRN: [id number] DOB: 14-Dec-1950 Today's Date: 05/18/2019    History of Present Illness [name] is a 69 y.o. female presenting 05/14/19 with 2-week history of left hip pain from known multiple left pubic rami and left sacral fractures after fall.  (Of note, was seen by Dr Maureen Ralphs in his office after fall). Progressively became unable to ambulate at home due to incr pain, and presented to hospital. PMH is significant for HTN, migraine, H/O TIA (on anticoagulation), h/o pulmonary embolism, PVD, OA of knee, CKD stage III, anxiety    PT Comments    Continuing work on functional mobility and activity tolerance;  More painful today, but able and willing to mobilize out of bed; Noted weight bearing status has changed by Ortho to TWB LLE; Education provided and she has good adherence to TWB; Walked with bil UE support on RW to and from bathroom; Painful, but good participation; Continue to recommend comprehensive inpatient rehab (CIR) for post-acute therapy needs.   Follow Up Recommendations  CIR     Equipment Recommendations  None recommended by PT    Recommendations for Other Services       Precautions / Restrictions Precautions Precautions: Fall Restrictions LLE Weight Bearing: Touch Down Weight bearing   Mobility  Bed Mobility Overal bed mobility: Needs Assistance Bed Mobility: Supine to Sit     Supine to sit: Min guard     General bed mobility comments: Minguard for safety, increased time and effort, and she used a belt to self-assist her LLE off of teh bed; very guarded with anticipation of pain  Transfers Overall transfer level: Needs assistance Equipment used: Rolling walker (2 wheeled) Transfers: Sit to/from Stand Sit to Stand: Min guard         General transfer comment: Minguard for safety, especially with trasnition of hands from bed to RW; cues for hand placement, and to position LLE to keep  wiehgt off of it during transitions  Ambulation/Gait Ambulation/Gait assistance: Min assist Gait Distance (Feet): 20 Feet(to and from bathroom) Assistive device: Rolling walker (2 wheeled) Gait Pattern/deviations: Step-to pattern;Decreased stance time - left;Antalgic(noting step-through emerging)     General Gait Details: Cues for gait sequence, and to use RW to take pressure off of painful LLE in stance; able to get her whole L foot down in stance today; Maintinaing TWB LLE well   Stairs             Wheelchair Mobility    Modified Rankin (Stroke Patients Only)       Balance     Sitting balance-Leahy Scale: Fair       Standing balance-Leahy Scale: Poor(approaching Fair) Standing balance comment: Briefly stood at sink; Essentially stayed in R single limb stance, with L toes touching down                            Cognition Arousal/Alertness: Awake/alert Behavior During Therapy: WFL for tasks assessed/performed;Anxious Overall Cognitive Status: Within Functional Limits for tasks assessed                                        Exercises Other Exercises Other Exercises: Self-AAROM Hellslides with belt x10 Other Exercises: Self-AAROM straight leg raises x10 with belt Other Exercises: Self-AAROM hip abduction/adduction with belt x10    General Comments  Pertinent Vitals/Pain Pain Assessment: 0-10 Pain Score: 8  Pain Location: left hip down to foot Pain Descriptors / Indicators: Stabbing;Grimacing;Crying Pain Intervention(s): Monitored during session;Premedicated before session;Repositioned    Home Living                      Prior Function            PT Goals (current goals can now be found in the care plan section) Acute Rehab PT Goals Patient Stated Goal: I want to stay here for therapy and then go home PT Goal Formulation: With patient Time For Goal Achievement: 05/29/19 Potential to Achieve Goals:  Good Progress towards PT goals: Progressing toward goals(desptie incr pain)    Frequency    Min 4X/week      PT Plan Current plan remains appropriate    Co-evaluation              AM-PAC PT "6 Clicks" Mobility   Outcome Measure  Help needed turning from your back to your side while in a flat bed without using bedrails?: A Little Help needed moving from lying on your back to sitting on the side of a flat bed without using bedrails?: None Help needed moving to and from a bed to a chair (including a wheelchair)?: A Little Help needed standing up from a chair using your arms (e.g., wheelchair or bedside chair)?: A Little Help needed to walk in hospital room?: A Little Help needed climbing 3-5 steps with a railing? : A Lot 6 Click Score: 18    End of Session Equipment Utilized During Treatment: Gait belt Activity Tolerance: Patient tolerated treatment well Patient left: in chair;with call bell/phone within reach;with chair alarm set Nurse Communication: Mobility status PT Visit Diagnosis: Difficulty in walking, not elsewhere classified (R26.2);Pain Pain - Right/Left: Left Pain - part of body: Hip;Leg     Time: [id number] PT Time Calculation (min) (ACUTE ONLY): 20 min  Charges:  $Gait Training: 8-22 mins                     Roney Marion, PT  Acute Rehabilitation Services Pager [phone] Office [phone]    [name] 05/18/2019, 12:50 PM

## 2019-05-18 NOTE — Progress Notes (Signed)
CALL PAGER [phone] for any questions or notifications regarding this patient  FMTS Attending Note: Dorcas Mcmurray MD  I reviewed her MRI LS spine report and images with her and her daughter at bedside.  I again explained to her that I think a good part of this pain is neuropathic.  I think during her fall (or at some point after the initial pelvic fracture), she has aggravated her severe spinal stenosis  And is now suffering a neuropathy.  We finally seem to have fairly good control of her pain.  This is the first time I have seen her able to converse in a normal fashion.  I discussed with her risks of opioid such as constipation, respiratory depression etc.  She says she is going to [facility] as sparingly as possible but is extremely grateful to finally have some relief.Marland Kitchen Hopefully in 24 to 48 hours we can begin to taper her off this and on to something oral.  I do think CIR is interested in admitting her to the rehab unit which I think would be excellent for her.  She is in agreement with this.

## 2019-05-18 NOTE — Progress Notes (Signed)
Family Medicine Teaching Service Daily Progress Note Intern Pager: [phone]  Patient name: [name] Medical record number: [id number] Date of birth: November 18, 1950 Age: 69 y.o. Gender: female  Primary Care Provider: Richarda Osmond, DO Consultants: none Code Status: dnr/dni  Pt Overview and Major Events to Date:  3/6 admitted to fpts  Assessment and Plan: [name] is a 69 y.o. female presenting with 2-week history of left hip pain from known pubic rami fracture.  Unable to ambulate at home, not felt safe to go home.  Left hip pain/fracture to left inferior and superior pubic rami Patient was very tearful when I went to see her this morning. Endorses 20/10 pain in left hip and left lumbar region. Reports that she did not receive enough dilaudid overnight and had to ask at 3am for another dose after night team was paged. I explained that per the chart she did have 4 doses of 1mg  dilaudid yesterday and that she can request more dilaudid up to 2mg  every 6 hours. Pt expressed understanding. -PT/OT: 24 hr assistance-hopeful for CIR, pt does not want SNF -Social work to assist with placement once eval performed -Tylenol 1000mg  Q6H -Lovenox for DVT prophylaxis -Ortho following, appreciate recommendations-per note this morning recommend non-operative management  -Continue Vitamin D: 50,000 units every 7 days started on 3/7  Continue calcium supplementation -DISCONTINUE IV dialudid Q6H today -Start Dilaudid PCA for persistent and worsening pain -Increased Gabapentin dose  (600mg , 600mg , 900mg ) -MRI lumbar spine today without contrast. Pt to receive 2mg  IV dilaudid 30 mins prior to MRI  Normocytic anemia Hemoglobin 11.1. 11.6>11.4>10.8>10.9. Likely 2/2 secondary to Plavix and bleeding into pelvis from hip fracture. We will continue to monitor but given stability even after dose of Lovenox, unlikely to be a acute bleed at this point. -Lovenox for DVT prophylaxis -Daily  CBC -Monitor for any signs symptoms of bleeding  History of TIA No symptoms this point.  Can follow-up with neurology or PCP discuss necessity of Plavix going forward.  History of migraines Denies headache. Currently taking propranolol, Zoloft, zonergan.  -Continue Zoloft 100 mg daily.  -Continue Zonergan 100 mg twice daily -Propranolol 10 mg twice daily  Hyperlipidemia Currently receiving Repatha 140 mg every 2 weeks.  Next lipid panel due in about 3 months, can follow PCP for this issue.  Hypertension BP labile, 0000000 systolic likely 2.2 to discomfort and pain Home meds: propranolol, furosemide, ramipril -Monitor BP  -Continue propranolol 10 mg twice daily -Continue furosemide 40 mg daily -Continue ramipril 2.5 mg daily  CKD stage IIIa Creatinine 1.25 today. 1.18>1.16 -Encourage oral hydration -Monitor BMP  -Can discontinue ramipril if GFR decreases.  History of pulmonary embolus History of PE back in 2017. Was on Eliquis for period time, but this was discontinued years ago.  Monitor for any signs and symptoms of shortness of breath, chest pain, tachycardia while admitted.  GERD Asymptomatic -Continue Protonix 40 mg daily.  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  Taking gabapentin 600 mg 3 times daily for this.  We will continue while admitted.  FEN/GI: Regular PPx: lovenox  Disposition: Likely CIR, pending clinical course  Subjective:  Patient very tearful this morning. Did not have a good night because of left hip pain. Said that she did not get enough dilaudid overnight.   Objective: Temp:  [98.3 F (36.8 C)-98.5 F (36.9 C)] 98.4 F (36.9 C) (03/10 0729) Pulse Rate:  [80-92] 89 (03/10 0729) Resp:  [15-18]  15 (03/10 0729) BP: (153-195)/(77-102) 195/102 (03/10 0729) SpO2:  [95 %-97 %] 97 % (03/10 0729)    Physical Exam:  General: pleasant, tearful 69 year old female, in significant distress  Cardio:  Normal S1 and S2, RRR. No murmurs or rubs.   Pulm: ctab anteriorly, normal WOB Abdomen: Bowel sounds normal. Abdomen soft and non-tender.  MSK: tenderness on palpation of left hip joint and lumbar spinal muscles Extremities: No peripheral edema. Warm/ well perfused.  Strong radial pulse Neuro: Cranial nerves grossly intact  Laboratory: Recent Labs  Lab 05/16/19 0247 05/17/19 0400 05/18/19 0420  WBC 8.8 8.7 11.1*  HGB 11.4* 11.6* 11.8*  HCT 35.8* 37.0 36.8  PLT 274 302 298   Recent Labs  Lab 05/16/19 0247 05/17/19 0400 05/18/19 0420  NA 142 139 141  K 5.3* 4.1 4.1  CL 103 98 102  CO2 30 29 29   BUN 13 19 22   CREATININE 1.16* 1.18* 1.25*  CALCIUM 9.2 9.2 9.2  PROT  --  6.3*  --   BILITOT  --  0.5  --   ALKPHOS  --  162*  --   ALT  --  29  --   AST  --  26  --   GLUCOSE 114* 114* 113*    Imaging/Diagnostic Tests: No new imaging  Lattie Haw, MD 05/18/2019, 7:51 AM PGY-1, [place] Intern pager: [phone], text pages welcome

## 2019-05-18 NOTE — Consult Note (Signed)
Reason for Consult:Pelvic fxs Referring Physician: Grazia Dudzinski is an 69 y.o. female.  HPI: [name] was putting on her clothes in the bathroom when she lost her balance and fell backwards. She had immediate pelvic and left hip pain and could not get up. She had fallen about 2 weeks ago and had non-operative pelvic fxs but was having difficulty with pain management and ambulation. CT scan showed the aforementioned fxs and she was admitted and orthopedic surgery was consulted. She got up with therapies yesterday and said that went pretty well but she had a bad night in terms of pain control.  History reviewed. No pertinent past medical history.  No family history on file.  Social History:  reports that she has quit smoking. She has never used smokeless tobacco. No history on file for alcohol and drug.  Allergies:  Allergies  Allergen Reactions  . Fish Allergy Anaphylaxis  . Shellfish Allergy Anaphylaxis    Medications: I have reviewed the patient's current medications.  Results for orders placed or performed during the hospital encounter of 05/14/19 (from the past 48 hour(s))  Comprehensive metabolic panel     Status: Abnormal   Collection Time: 05/17/19  4:00 AM  Result Value Ref Range   Sodium 139 135 - 145 mmol/L   Potassium 4.1 3.5 - 5.1 mmol/L   Chloride 98 98 - 111 mmol/L   CO2 29 22 - 32 mmol/L   Glucose, Bld 114 (H) 70 - 99 mg/dL    Comment: Glucose reference range applies only to samples taken after fasting for at least 8 hours.   BUN 19 8 - 23 mg/dL   Creatinine, Ser 1.18 (H) 0.44 - 1.00 mg/dL   Calcium 9.2 8.9 - 10.3 mg/dL   Total Protein 6.3 (L) 6.5 - 8.1 g/dL   Albumin 3.1 (L) 3.5 - 5.0 g/dL   AST 26 15 - 41 U/L   ALT 29 0 - 44 U/L   Alkaline Phosphatase 162 (H) 38 - 126 U/L   Total Bilirubin 0.5 0.3 - 1.2 mg/dL   GFR calc non Af Amer 48 (L) >60 mL/min   GFR calc Af Amer 55 (L) >60 mL/min   Anion gap 12 5 - 15    Comment: Performed at [facility] [address] [place], [place] [address]  CBC     Status: Abnormal   Collection Time: 05/17/19  4:00 AM  Result Value Ref Range   WBC 8.7 4.0 - 10.5 K/uL   RBC 4.20 3.87 - 5.11 MIL/uL   Hemoglobin 11.6 (L) 12.0 - 15.0 g/dL   HCT 37.0 36.0 - 46.0 %   MCV 88.1 80.0 - 100.0 fL   MCH 27.6 26.0 - 34.0 pg   MCHC 31.4 30.0 - 36.0 g/dL   RDW 14.5 11.5 - 15.5 %   Platelets 302 150 - 400 K/uL   nRBC 0.0 0.0 - 0.2 %    Comment: Performed at [facility], [place] [address] [place], [place] [address]  CBC     Status: Abnormal   Collection Time: 05/18/19  4:20 AM  Result Value Ref Range   WBC 11.1 (H) 4.0 - 10.5 K/uL   RBC 4.26 3.87 - 5.11 MIL/uL   Hemoglobin 11.8 (L) 12.0 - 15.0 g/dL   HCT 36.8 36.0 - 46.0 %   MCV 86.4 80.0 - 100.0 fL   MCH 27.7 26.0 - 34.0 pg   MCHC 32.1 30.0 - 36.0  g/dL   RDW 14.2 11.5 - 15.5 %   Platelets 298 150 - 400 K/uL   nRBC 0.0 0.0 - 0.2 %    Comment: Performed at [facility], [place] [address], [place], [place] [id number]  Basic metabolic panel     Status: Abnormal   Collection Time: 05/18/19  4:20 AM  Result Value Ref Range   Sodium 141 135 - 145 mmol/L   Potassium 4.1 3.5 - 5.1 mmol/L   Chloride 102 98 - 111 mmol/L   CO2 29 22 - 32 mmol/L   Glucose, Bld 113 (H) 70 - 99 mg/dL    Comment: Glucose reference range applies only to samples taken after fasting for at least 8 hours.   BUN 22 8 - 23 mg/dL   Creatinine, Ser 1.25 (H) 0.44 - 1.00 mg/dL   Calcium 9.2 8.9 - 10.3 mg/dL   GFR calc non Af Amer 44 (L) >60 mL/min   GFR calc Af Amer 52 (L) >60 mL/min   Anion gap 10 5 - 15    Comment: Performed at [facility] [address], [place], [place] [address]    CT LUMBAR SPINE WO CONTRAST  Result Date: 05/16/2019 CLINICAL DATA:  Fall with multiple pelvic fractures EXAM: CT LUMBAR SPINE WITHOUT CONTRAST TECHNIQUE: Multidetector CT imaging of the lumbar spine was performed without intravenous contrast administration. Multiplanar CT image  reconstructions were also generated. COMPARISON:  None. FINDINGS: Segmentation: 5 lumbar type vertebrae. Alignment: Trace retrolisthesis at L3-L4. Vertebrae: There is chronic compression deformity of L1 with less than 50% loss of height at the superior endplate. There is no acute fracture of the lumbar spine. Paraspinal and other soft tissues: No acute abnormality. Disc levels: Multilevel degenerative changes are present including disc height loss, vacuum disc phenomenon, endplate sclerosis, endplate osteophytes, and facet hypertrophy. There is no high-grade osseous encroachment on the spinal canal. Neural foraminal narrowing is greatest at L5-S1. IMPRESSION: No acute fracture of the lumbar spine. There is a fracture of the left sacrum. The pelvis is dictated separately. Electronically Signed   By: Macy Mis M.D.   On: 05/16/2019 18:49   CT PELVIS WO CONTRAST  Result Date: 05/16/2019 CLINICAL DATA:  Pelvic pain. Left hip pain from known multiple left pubic rami fractures and left sacral fractures. EXAM: CT PELVIS WITHOUT CONTRAST TECHNIQUE: Multidetector CT imaging of the pelvis was performed following the standard protocol without intravenous contrast. COMPARISON:  05/14/2019 plain film. Abdominopelvic CT of 04/04/2018. Today's lumbar spine CT is dictated separately. FINDINGS: Urinary Tract: Pelvic floor laxity with suggestion of mild cystocele. No distal hydroureter. Bowel: Colonic stool burden suggests constipation. Normal imaged terminal ileum and appendix. Normal pelvic small bowel loops. Vascular/Lymphatic: Aortic and branch vessel atherosclerosis. No pelvic sidewall adenopathy. Reproductive:  Hysterectomy.  No adnexal mass. Other: No significant free fluid. Mild pelvic floor laxity. No free intraperitoneal air. Musculoskeletal: Nonspecific edema superficial to the greater trochanters. No well-defined pelvic hematoma. Gas within the right anterior pelvic wall may be iatrogenic. Femoral heads are  located. Minimally displaced left inferior pubic ramus fracture on 42/13. Minimally displaced parasymphyseal left pubic bone fracture on 38/13. Minimally displaced anterior left acetabular fracture on 32/13. Left sacral fractures including with extension into the left sacroiliac joint on 16/13. Degenerate disc disease at L4-5 and L5-S1. IMPRESSION: 1. Left-sided pelvic fractures, as above. 2. Possible constipation. 3. Pelvic floor laxity with suggestion of mild cystocele. 4. Aortic Atherosclerosis (ICD10-I70.0). Electronically Signed   By: Abigail Miyamoto  M.D.   On: 05/16/2019 19:51    Review of Systems  HENT: Negative for ear discharge, ear pain, hearing loss and tinnitus.   Eyes: Negative for photophobia and pain.  Respiratory: Negative for cough and shortness of breath.   Cardiovascular: Negative for chest pain.  Gastrointestinal: Negative for abdominal pain, nausea and vomiting.  Genitourinary: Positive for pelvic pain. Negative for dysuria, flank pain, frequency and urgency.  Musculoskeletal: Positive for arthralgias (Left hip). Negative for back pain, myalgias and neck pain.  Neurological: Negative for dizziness and headaches.  Hematological: Does not bruise/bleed easily.  Psychiatric/Behavioral: The patient is not nervous/anxious.    Blood pressure (!) 195/102, pulse 89, temperature 98.4 F (36.9 C), temperature source Oral, resp. rate 15, height 5\' 7"  (1.702 m), weight 81.8 kg, SpO2 97 %. Physical Exam  Constitutional: She appears well-developed and well-nourished. No distress.  HENT:  Head: Normocephalic and atraumatic.  Eyes: Conjunctivae are normal. Right eye exhibits no discharge. Left eye exhibits no discharge. No scleral icterus.  Cardiovascular: Normal rate and regular rhythm.  Respiratory: Effort normal. No respiratory distress.  Musculoskeletal:     Cervical back: Normal range of motion.     Comments: LLE No traumatic wounds, ecchymosis, or rash  Mod TTP hip, mild TTP  knee  No knee or ankle effusion  Knee stable to varus/ valgus and anterior/posterior stress  Sens DPN, SPN, TN intact  Motor EHL, ext, flex, evers 5/5  DP 1+, PT 0, No significant edema  Neurological: She is alert.  Skin: Skin is warm and dry. She is not diaphoretic.  Psychiatric: She has a normal mood and affect. Her behavior is normal.    Assessment/Plan: Pelvic fxs -- Continue to be non-operative. She should be TDWB on the LLE. She should f/u with Dr. Maureen Ralphs in 2-3 weeks.  Multiple medical problems including anemia, migraine, TIA's, HTN, HLD, CKD, and GERD -- per primary service     Lisette Abu, PA-C Orthopedic Surgery [phone] 05/18/2019, 9:20 AM

## 2019-05-18 NOTE — Progress Notes (Addendum)
Hydromorphone Medication Review   Per team and attending discussion today, patient is being started on a hydromorphone PCA with a 1mg  loading dose, 0.1mg /hr continuous rate and 0.1mg  bolus with 8 min lockout and one hour dose limit of 0.3mg .  While verifying order, spoke with MD regarding risk of drug accumulation with the continuous basal rate in this patient. Team states awareness and that per the team's discussion they want to try this regimen given her severe uncontrolled pain and will adjust as needed during the day.   Another order for hydromorphone 2mg  one time before MRI was entered with comments to hold dose if patient is already on PCA. Called RN to explain the duplicate hydromorphone PCA and one time push orders. RN was grateful for the explanation and restated the instruction to hold the one time dose if patient is already on the PCA at the time of MRI. Also asked RN to monitor patient closely due to the continuous basal rate in the PCA.  Of note, this patient's first opiate prescription was on 05/13/19 (hydrocodone-acetaminophen 5-325mg  quantity 21 for 7 day supply) with no apparent opiate use prior to this. Patient has required a high amount of opiates since admission (up to ~153 mg PO morphine equivalents per day) due to severe pain from hip fractures and has not demonstrated any respiratory depression. Team is aware of this information from discussions earlier this week.    Benetta Spar, PharmD, BCPS, BCCP Clinical Pharmacist  Please check AMION for all [facility] phone numbers After 10:00 PM, call [place] [phone]

## 2019-05-18 NOTE — Progress Notes (Signed)
Inpatient Rehabilitation-Admissions Coordinator   Spoke with attending service regarding readiness. Per Family Medicine, this patient's pain is not yet under control and pt is not ready for CIR admission today. AC will continue to follow along for medical readiness and functional need.   Raechel Ache, OTR/L  Rehab Admissions Coordinator  [phone] 05/18/2019 9:45 AM

## 2019-05-18 NOTE — Progress Notes (Signed)
OT Cancellation Note  Patient Details Name: [name] MRN: [id number] DOB: 05/31/1950   Cancelled Treatment:    Reason Eval/Treat Not Completed: Patient at procedure or test/ unavailable  Pt off unit at MRI. Will follow up as time allows and pt is appropriate.   [facility] OTR/L Acute Rehabilitation Services Office: [place] 05/18/2019, 2:57 PM

## 2019-05-19 DIAGNOSIS — S32415D Nondisplaced fracture of anterior wall of left acetabulum, subsequent encounter for fracture with routine healing: Secondary | ICD-10-CM

## 2019-05-19 DIAGNOSIS — R296 Repeated falls: Secondary | ICD-10-CM

## 2019-05-19 LAB — BASIC METABOLIC PANEL
Anion gap: 11 (ref 5–15)
BUN: 19 mg/dL (ref 8–23)
CO2: 28 mmol/L (ref 22–32)
Calcium: 8.9 mg/dL (ref 8.9–10.3)
Chloride: 101 mmol/L (ref 98–111)
Creatinine, Ser: 0.86 mg/dL (ref 0.44–1.00)
GFR calc Af Amer: >60 mL/min (ref >60)
GFR calc non Af Amer: >60 mL/min (ref >60)
Glucose, Bld: 115 mg/dL — ABNORMAL HIGH (ref 70–99)
Potassium: 4.1 mmol/L (ref 3.5–5.1)
Sodium: 140 mmol/L (ref 135–145)

## 2019-05-19 LAB — CBC
HCT: 39.5 % (ref 36.0–46.0)
Hemoglobin: 12.4 g/dL (ref 12.0–15.0)
MCH: 27.4 pg (ref 26.0–34.0)
MCHC: 31.4 g/dL (ref 30.0–36.0)
MCV: 87.4 fL (ref 80.0–100.0)
Platelets: 309 10*3/uL (ref 150–400)
RBC: 4.52 MIL/uL (ref 3.87–5.11)
RDW: 14.3 % (ref 11.5–15.5)
WBC: 9.9 10*3/uL (ref 4.0–10.5)
nRBC: 0 % (ref 0.0–0.2)

## 2019-05-19 MED ORDER — HYDROMORPHONE 1 MG/ML IV SOLN
INTRAVENOUS | Status: DC
  Administered 2019-05-19: 2.88 mg via INTRAVENOUS
  Administered 2019-05-19: 30 mg via INTRAVENOUS
  Administered 2019-05-20: 0.39 mg via INTRAVENOUS
  Administered 2019-05-20: 0.2 mg via INTRAVENOUS

## 2019-05-19 MED ORDER — BACLOFEN 5 MG HALF TABLET
5.0000 mg | ORAL_TABLET | Freq: Three times a day (TID) | ORAL | Status: DC
  Administered 2019-05-19 (×2): 5 mg via ORAL
  Filled 2019-05-19 (×2): qty 1

## 2019-05-19 MED ORDER — SODIUM CHLORIDE 0.9 % IV SOLN
INTRAVENOUS | Status: DC | PRN
  Administered 2019-05-19: 250 mL via INTRAVENOUS

## 2019-05-19 MED ORDER — SENNA 8.6 MG PO TABS
1.0000 | ORAL_TABLET | Freq: Every day | ORAL | Status: DC
  Administered 2019-05-20 – 2019-05-23 (×2): 8.6 mg via ORAL
  Filled 2019-05-19 (×3): qty 1

## 2019-05-19 MED ORDER — KETOROLAC TROMETHAMINE 15 MG/ML IJ SOLN
15.0000 mg | Freq: Four times a day (QID) | INTRAMUSCULAR | Status: AC
  Administered 2019-05-19 – 2019-05-20 (×4): 15 mg via INTRAVENOUS
  Filled 2019-05-19 (×4): qty 1

## 2019-05-19 NOTE — Progress Notes (Signed)
Physical Therapy Treatment Patient Details Name: [name] MRN: [id number] DOB: [birth year] Today's Date: 05/19/2019    History of Present Illness [name] is a 69 y.o. female presenting 05/14/19 with 2-week history of left hip pain from known multiple left pubic rami and left sacral fractures after fall.  (Of note, was seen by Dr Maureen Ralphs in his office after fall). Progressively became unable to ambulate at home due to incr pain, and presented to hospital. PMH is significant for HTN, migraine, H/O TIA (on anticoagulation), h/o pulmonary embolism, PVD, OA of knee, CKD stage III, anxiety    PT Comments    Pt very limited this session secondary to increasing pain in L hip and groin. Pt's RN present upon arrival and reporting that the PCA is not helping pt's pain. Pt still agreeable to LE therex and transfer OOB to recliner chair with min A. Pt would continue to benefit from skilled physical therapy services at this time while admitted and after d/c to address the below listed limitations in order to improve overall safety and independence with functional mobility.    Follow Up Recommendations  CIR     Equipment Recommendations  None recommended by PT    Recommendations for Other Services       Precautions / Restrictions Precautions Precautions: Fall Restrictions Weight Bearing Restrictions: Yes LLE Weight Bearing: Touchdown weight bearing Other Position/Activity Restrictions: per Ortho (consult note on 3/10)   Mobility  Bed Mobility Overal bed mobility: Needs Assistance Bed Mobility: Supine to Sit     Supine to sit: Min assist     General bed mobility comments: increased time and effort, min A for movement of L LE off of bed towards pt's R side  Transfers Overall transfer level: Needs assistance Equipment used: Rolling walker (2 wheeled) Transfers: Sit to/from [facility] Sit to Stand: Min guard Stand pivot transfers: Min assist        General transfer comment: good technique utilized, min guard for transition into standing from EOB; min A for stability with pivotal movement to chair towards pt's R side  Ambulation/Gait             General Gait Details: deferred secondary to significant pain (pt reporting 20/10 and PCA ineffective)   Stairs             Wheelchair Mobility    Modified Rankin (Stroke Patients Only)       Balance Overall balance assessment: Needs assistance Sitting-balance support: Feet supported Sitting balance-Leahy Scale: Fair     Standing balance support: Bilateral upper extremity supported Standing balance-Leahy Scale: Poor                              Cognition Arousal/Alertness: Awake/alert Behavior During Therapy: WFL for tasks assessed/performed;Anxious Overall Cognitive Status: Within Functional Limits for tasks assessed                                        Exercises General Exercises - Lower Extremity Ankle Circles/Pumps: AROM;Both;10 reps;Supine Short Arc Quad: AROM;Strengthening;Left;10 reps;Supine Heel Slides: AROM;Strengthening;Left;10 reps;Supine Hip ABduction/ADduction: AROM;Strengthening;Left;10 reps;Supine    General Comments        Pertinent Vitals/Pain Pain Assessment: 0-10 Pain Score: 10-Worst pain ever Pain Location: L hip and groin Pain Descriptors / Indicators: Tightness Pain Intervention(s): Limited activity within patient's tolerance;Monitored during session;Repositioned  Home Living                      Prior Function            PT Goals (current goals can now be found in the care plan section) Acute Rehab PT Goals PT Goal Formulation: With patient Time For Goal Achievement: 05/29/19 Potential to Achieve Goals: Good Progress towards PT goals: Progressing toward goals    Frequency    Min 4X/week      PT Plan Current plan remains appropriate    Co-evaluation               AM-PAC PT "6 Clicks" Mobility   Outcome Measure  Help needed turning from your back to your side while in a flat bed without using bedrails?: A Little Help needed moving from lying on your back to sitting on the side of a flat bed without using bedrails?: None Help needed moving to and from a bed to a chair (including a wheelchair)?: A Little Help needed standing up from a chair using your arms (e.g., wheelchair or bedside chair)?: A Little Help needed to walk in hospital room?: A Little Help needed climbing 3-5 steps with a railing? : A Lot 6 Click Score: 18    End of Session Equipment Utilized During Treatment: Gait belt Activity Tolerance: Patient tolerated treatment well Patient left: in chair;with call bell/phone within reach Nurse Communication: Mobility status PT Visit Diagnosis: Difficulty in walking, not elsewhere classified (R26.2);Pain Pain - Right/Left: Left Pain - part of body: Hip;Leg     Time: [id number] PT Time Calculation (min) (ACUTE ONLY): 14 min  Charges:  $Therapeutic Activity: 8-22 mins                     Anastasio Champion, DPT  Acute Rehabilitation Services Pager [phone] Office [place] 05/19/2019, 9:51 AM

## 2019-05-19 NOTE — Progress Notes (Signed)
Family Medicine Teaching Service Daily Progress Note Intern Pager: [phone]  Patient name: [name] Medical record number: [id number] Date of birth: 10-18-1950 Age: 69 y.o. Gender: female  Primary Care Provider: Richarda Osmond, DO Consultants: none Code Status: dnr/dni  Pt Overview and Major Events to Date:  3/6 admitted to fpts  Assessment and Plan: [name] is a 69 y.o. female presenting with 2-week history of left hip pain from known pubic rami fracture.  Unable to ambulate at home, not felt safe to go home.  Left hip pain/fracture to left inferior and superior pubic rami, non operative management  Patient was tearful again this morning reporting 20/10 severity in left hip radiating to anterior knee. Reports Dilaudid PCA has not helped much. -PT/OT: 24 hr assistance-hopeful for CIR, pt does not want SNF -Social work to assist with placement once eval performed -Tylenol 1000mg  Q6H -Lovenox for DVT prophylaxis -Continue Vitamin D: 50,000 units every 7 days  -Continue calcium supplementation -Continue Dilaudid PCA, will investigate PCA values to estimate total dose of dialudid in last 24 hrs to help titrate up the dilaudid -Continue Gabapentin  -Scheduled Baclofen 5mg  TID today -Scheduled Toradol 15mg  Q6H today with PPI  -Ice to affected areas of pain  -BMP am  -If pain is not well controlled despite PCA and above medications can consider an epidural  Normocytic anemia Hemoglobin 12.4 today, 11.1>11.6>11.4>10.8>10.9. Likely 2/2 secondary to Plavix and bleeding into pelvis from hip fracture. We will continue to monitor but given stability even after dose of Lovenox, unlikely to be a acute bleed at this point. -Lovenox for DVT prophylaxis -Daily CBC -Monitor for any signs symptoms of bleeding  History of TIA No symptoms this point.  Can follow-up with neurology or PCP discuss necessity of Plavix going forward.  History of migraines Denies  headache. Currently taking propranolol, Zoloft, zonergan.  -Continue Zoloft 100 mg daily.  -Continue Zonergan 100 mg twice daily -Propranolol 10 mg twice daily  Hyperlipidemia Currently receiving Repatha 140 mg every 2 weeks.  Next lipid panel due in about 3 months, can follow PCP for this issue.  Hypertension BP have been higher 170s-180s likely 2/2 patient's significant pain Home meds: propranolol, furosemide, ramipril -Monitor BP  -Continue propranolol 10 mg twice daily -Continue furosemide 40 mg daily -Ramipril held on admission, continue to hold Ramipril given starting Toradol today and could worsen Cr  CKD stage IIIa Cr 0.86, 1.25>1.18>1.16 -Encourage oral hydration -Monitor BMP  -Can discontinue ramipril if GFR decreases.  History of pulmonary embolus History of PE back in 2017. Was on Eliquis for period time, but this was discontinued years ago.  Monitor for any signs and symptoms of shortness of breath, chest pain, tachycardia while admitted.  GERD Asymptomatic -Continue Protonix 40 mg daily.  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  Taking gabapentin 600 mg 3 times daily for this.  We will continue while admitted.  FEN/GI: Regular PPx: lovenox  Disposition: Likely CIR, pending clinical course  Subjective:  Pt tearful again this morning, pain has not improved with dilaudid PCA.  Objective: Temp:  [97.6 F (36.4 C)-99.5 F (37.5 C)] 99.5 F (37.5 C) (03/11 1206) Pulse Rate:  [82-96] 86 (03/11 1206) Resp:  [12-19] 19 (03/11 1217) BP: (142-189)/(87-99) 142/94 (03/11 1206) SpO2:  [93 %-97 %] 97 % (03/11 1217)    Physical Exam:  General: tearful, in noticeable distress secondary to pain  Cardio: Normal S1 and S2, RRR, no  rubs or gallops  Pulm: CTAB, normal WOB  Abdomen: Bowel sounds normal. Abdomen soft and non-tender.  Extremities: No peripheral edema. Warm/ well perfused.  Strong radial pulse  Neuro:  Cranial nerves grossly intact  Laboratory: Recent Labs  Lab 05/17/19 0400 05/18/19 0420 05/19/19 0744  WBC 8.7 11.1* 9.9  HGB 11.6* 11.8* 12.4  HCT 37.0 36.8 39.5  PLT 302 298 309   Recent Labs  Lab 05/17/19 0400 05/18/19 0420 05/19/19 0744  NA 139 141 140  K 4.1 4.1 4.1  CL 98 102 101  CO2 29 29 28   BUN 19 22 19   CREATININE 1.18* 1.25* 0.86  CALCIUM 9.2 9.2 8.9  PROT 6.3*  --   --   BILITOT 0.5  --   --   ALKPHOS 162*  --   --   ALT 29  --   --   AST 26  --   --   GLUCOSE 114* 113* 115*    Imaging/Diagnostic Tests: No new imaging  Lattie Haw, MD 05/19/2019, 12:26 PM PGY-1, [place] Intern pager: [phone], text pages welcome

## 2019-05-19 NOTE — Progress Notes (Signed)
Inpatient Rehabilitation-Admissions Coordinator   Pt tearful this AM due to pain. Noted she is now on PCA for pain control. Will continue to follow pt's pain tolerance to determine if/when she may be appropriate for CIR.   Raechel Ache, OTR/L  Rehab Admissions Coordinator  [phone] 05/19/2019 11:11 AM

## 2019-05-19 NOTE — Plan of Care (Signed)
  Problem: Education: Goal: Knowledge of General Education information will improve Description: Including pain rating scale, medication(s)/side effects and non-pharmacologic comfort measures Outcome: Progressing  Discussed synergistic effects of pain medications, educated patient to press PCA button whenever she is in pain so PCA pump will provide data to MD>  Problem: Activity: Goal: Risk for activity intolerance will decrease Outcome: Progressing  Paitent up to chair and bedside commode 2x each today.  Problem: Elimination: Goal: Will not experience complications related to bowel motility Outcome: Progressing  Pt w/ BM today.  Problem: Pain Managment: Goal: General experience of comfort will improve Outcome: Progressing  Patient states pain control improved with PCA settings changed and toradol added to regimen.

## 2019-05-19 NOTE — Progress Notes (Addendum)
Occupational Therapy Treatment Patient Details Name: [name] MRN: [id number] DOB: Jul 12, 1950 Today's Date: 05/19/2019    History of present illness [name] is a 69 y.o. female presenting 05/14/19 with 2-week history of left hip pain from known multiple left pubic rami and left sacral fractures after fall.  (Of note, was seen by Dr Maureen Ralphs in his office after fall). Progressively became unable to ambulate at home due to incr pain, and presented to hospital. PMH is significant for HTN, migraine, H/O TIA (on anticoagulation), h/o pulmonary embolism, PVD, OA of knee, CKD stage III, anxiety   OT comments  Pt in recliner upon arrival. Pt agreeable to ambulate to [facility] for toilet transfers and toilteting tasks. Pt stood at RW to perihygiene and to  wash/dry face and hands. Pt used PCA before getting out of recliner and stated that her pain had improved some since getting OOB with PT earlier. OT will continue to follow acutely   Follow Up Recommendations  CIR    Equipment Recommendations  None recommended by OT    Recommendations for Other Services      Precautions / Restrictions Precautions Precautions: Fall Restrictions Weight Bearing Restrictions: Yes LLE Weight Bearing: Touchdown weight bearing Other Position/Activity Restrictions: per Ortho       Mobility Bed Mobility Overal bed mobility: Needs Assistance Bed Mobility: Supine to Sit     Supine to sit: Min assist     General bed mobility comments: pt in recliner upon arrival  Transfers Overall transfer level: Needs assistance Equipment used: Rolling walker (2 wheeled) Transfers: Sit to/from [facility] Sit to Stand: Min guard Stand pivot transfers: Min guard       General transfer comment: good technique utilized, min guard for transition into standing from EOB; min A for stability with pivotal movement to chair towards pt's R side    Balance Overall balance assessment: Needs  assistance Sitting-balance support: Feet supported Sitting balance-Leahy Scale: Fair     Standing balance support: Bilateral upper extremity supported;During functional activity Standing balance-Leahy Scale: Poor                             ADL either performed or assessed with clinical judgement   ADL Overall ADL's : Needs assistance/impaired Eating/Feeding: Set up;Independent;Sitting   Grooming: Wash/dry hands;Wash/dry face;Min guard;Standing                   Toilet Transfer: Minimal assistance;Min guard;Ambulation;RW;BSC;Cueing for safety   Toileting- Clothing Manipulation and Hygiene: Minimal assistance;Sit to/from stand       Functional mobility during ADLs: Minimal assistance;Min guard;Rolling walker;Cueing for safety       Vision Patient Visual Report: No change from baseline     Perception     Praxis      Cognition Arousal/Alertness: Awake/alert Behavior During Therapy: WFL for tasks assessed/performed;Anxious Overall Cognitive Status: Within Functional Limits for tasks assessed                                          Exercises Exercises: General Lower Extremity General Exercises - Lower Extremity Ankle Circles/Pumps: AROM;Both;10 reps;Supine Short Arc Quad: AROM;Strengthening;Left;10 reps;Supine Heel Slides: AROM;Strengthening;Left;10 reps;Supine Hip ABduction/ADduction: AROM;Strengthening;Left;10 reps;Supine   Shoulder Instructions       General Comments      Pertinent Vitals/ Pain  Pain Assessment: Faces Pain Score: 10-Worst pain ever Faces Pain Scale: Hurts little more Pain Location: L hip and groin Pain Descriptors / Indicators: Tightness Pain Intervention(s): Limited activity within patient's tolerance;Monitored during session;Repositioned;PCA encouraged  Home Living                                          Prior Functioning/Environment              Frequency  Min  2X/week        Progress Toward Goals  OT Goals(current goals can now be found in the care plan section)  Progress towards OT goals: Progressing toward goals     Plan Discharge plan remains appropriate    Co-evaluation                 AM-PAC OT "6 Clicks" Daily Activity     Outcome Measure   Help from another person eating meals?: None Help from another person taking care of personal grooming?: A Little Help from another person toileting, which includes using toliet, bedpan, or urinal?: A Little Help from another person bathing (including washing, rinsing, drying)?: A Little Help from another person to put on and taking off regular upper body clothing?: A Little Help from another person to put on and taking off regular lower body clothing?: A Little 6 Click Score: 19    End of Session Equipment Utilized During Treatment: Gait belt;Rolling walker;Other (comment)(BSC)  OT Visit Diagnosis: Unsteadiness on feet (R26.81);Other abnormalities of gait and mobility (R26.89);Muscle weakness (generalized) (M62.81);History of falling (Z91.81);Pain Pain - Right/Left: Left   Activity Tolerance Patient tolerated treatment well;Patient limited by pain   Patient Left in chair;with call bell/phone within reach;with chair alarm set   Nurse Communication          Time: [id number] OT Time Calculation (min): 19 min  Charges: OT General Charges $OT Visit: 1 Visit OT Treatments $Self Care/Home Management : 8-22 mins     Britt Bottom 05/19/2019, 12:46 PM

## 2019-05-20 ENCOUNTER — Encounter (HOSPITAL_COMMUNITY): Payer: Self-pay | Admitting: Family Medicine

## 2019-05-20 DIAGNOSIS — R29898 Other symptoms and signs involving the musculoskeletal system: Secondary | ICD-10-CM

## 2019-05-20 LAB — CBC
HCT: 39.9 % (ref 36.0–46.0)
Hemoglobin: 12.5 g/dL (ref 12.0–15.0)
MCH: 27.4 pg (ref 26.0–34.0)
MCHC: 31.3 g/dL (ref 30.0–36.0)
MCV: 87.5 fL (ref 80.0–100.0)
Platelets: 351 10*3/uL (ref 150–400)
RBC: 4.56 MIL/uL (ref 3.87–5.11)
RDW: 14.7 % (ref 11.5–15.5)
WBC: 13.1 10*3/uL — ABNORMAL HIGH (ref 4.0–10.5)
nRBC: 0 % (ref 0.0–0.2)

## 2019-05-20 LAB — BASIC METABOLIC PANEL
Anion gap: 15 (ref 5–15)
Anion gap: 13 (ref 5–15)
BUN: 31 mg/dL — ABNORMAL HIGH (ref 8–23)
BUN: 35 mg/dL — ABNORMAL HIGH (ref 8–23)
CO2: 29 mmol/L (ref 22–32)
CO2: 30 mmol/L (ref 22–32)
Calcium: 8.9 mg/dL (ref 8.9–10.3)
Calcium: 8.7 mg/dL — ABNORMAL LOW (ref 8.9–10.3)
Chloride: 89 mmol/L — ABNORMAL LOW (ref 98–111)
Chloride: 89 mmol/L — ABNORMAL LOW (ref 98–111)
Creatinine, Ser: 1.56 mg/dL — ABNORMAL HIGH (ref 0.44–1.00)
Creatinine, Ser: 1.68 mg/dL — ABNORMAL HIGH (ref 0.44–1.00)
GFR calc Af Amer: 39 mL/min — ABNORMAL LOW (ref >60)
GFR calc Af Amer: 36 mL/min — ABNORMAL LOW (ref >60)
GFR calc non Af Amer: 34 mL/min — ABNORMAL LOW (ref >60)
GFR calc non Af Amer: 31 mL/min — ABNORMAL LOW (ref >60)
Glucose, Bld: 108 mg/dL — ABNORMAL HIGH (ref 70–99)
Glucose, Bld: 113 mg/dL — ABNORMAL HIGH (ref 70–99)
Potassium: 4.2 mmol/L (ref 3.5–5.1)
Potassium: 4.2 mmol/L (ref 3.5–5.1)
Sodium: 133 mmol/L — ABNORMAL LOW (ref 135–145)
Sodium: 132 mmol/L — ABNORMAL LOW (ref 135–145)

## 2019-05-20 MED ORDER — OXYCODONE-ACETAMINOPHEN 5-325 MG PO TABS
2.0000 | ORAL_TABLET | Freq: Four times a day (QID) | ORAL | Status: DC
  Administered 2019-05-21: 11:00:00 2 via ORAL
  Filled 2019-05-20 (×2): qty 2

## 2019-05-20 MED ORDER — OXYCODONE HCL 5 MG PO TABS
10.0000 mg | ORAL_TABLET | Freq: Four times a day (QID) | ORAL | Status: DC
  Administered 2019-05-20: 10 mg via ORAL
  Filled 2019-05-20: qty 2

## 2019-05-20 MED ORDER — BACLOFEN 5 MG HALF TABLET
5.0000 mg | ORAL_TABLET | Freq: Three times a day (TID) | ORAL | Status: DC
  Administered 2019-05-20 – 2019-05-23 (×10): 5 mg via ORAL
  Filled 2019-05-20 (×10): qty 1

## 2019-05-20 MED ORDER — OXYCODONE HCL 5 MG PO TABS
10.0000 mg | ORAL_TABLET | Freq: Four times a day (QID) | ORAL | Status: DC | PRN
  Administered 2019-05-20 – 2019-05-21 (×3): 10 mg via ORAL
  Filled 2019-05-20 (×3): qty 2

## 2019-05-20 MED ORDER — SODIUM CHLORIDE 0.9 % IV SOLN: INTRAVENOUS | Status: DC

## 2019-05-20 MED ORDER — ALUM & MAG HYDROXIDE-SIMETH 200-200-20 MG/5ML PO SUSP
5.0000 mL | Freq: Four times a day (QID) | ORAL | Status: DC | PRN
  Administered 2019-05-20: 5 mL via ORAL
  Filled 2019-05-20: qty 30

## 2019-05-20 MED ORDER — HYDROMORPHONE 1 MG/ML IV SOLN
INTRAVENOUS | Status: DC
  Administered 2019-05-20: 30 mg via INTRAVENOUS
  Filled 2019-05-20: qty 30

## 2019-05-20 NOTE — Plan of Care (Signed)

## 2019-05-20 NOTE — Plan of Care (Signed)

## 2019-05-20 NOTE — Progress Notes (Signed)
Physical Therapy Treatment Patient Details Name: [name] MRN: [id number] DOB: Oct 15, 1950 Today's Date: 05/20/2019    History of Present Illness [name] is a 69 y.o. female presenting 05/14/19 with 2-week history of left hip pain from known multiple left pubic rami and left sacral fractures after fall.  (Of note, was seen by Dr Maureen Ralphs in his office after fall). Progressively became unable to ambulate at home due to incr pain, and presented to hospital. PMH is significant for HTN, migraine, H/O TIA (on anticoagulation), h/o pulmonary embolism, PVD, OA of knee, CKD stage III, anxiety    PT Comments    Pt continuing to make steady progress with functional mobility. Of note, her pain was much better controlled today as compared to yesterday, reporting a 3/10. She was able to ambulate a further distance this session into hallway with RW.  Pt would continue to benefit from skilled physical therapy services at this time while admitted and after d/c to address the below listed limitations in order to improve overall safety and independence with functional mobility.   Follow Up Recommendations  CIR     Equipment Recommendations  None recommended by PT    Recommendations for Other Services       Precautions / Restrictions Precautions Precautions: Fall Restrictions Weight Bearing Restrictions: Yes LLE Weight Bearing: Weight bearing as tolerated    Mobility  Bed Mobility               General bed mobility comments: pt OOB in recliner chair upon arrival  Transfers Overall transfer level: Needs assistance Equipment used: Rolling walker (2 wheeled) Transfers: Sit to/from Stand Sit to Stand: Min guard         General transfer comment: good technique utilized, min guard for transition into standing from chair  Ambulation/Gait Ambulation/Gait assistance: Min assist;Min guard Gait Distance (Feet): 50 Feet Assistive device: Rolling walker (2 wheeled) Gait  Pattern/deviations: Step-to pattern;Decreased stance time - left;Antalgic Gait velocity: decr   General Gait Details: pt able to maintain TDWB'ing L LE independently throughout; mostly min guard overall, min A for safety and stability with navigation around obstacles in room   Stairs             Wheelchair Mobility    Modified Rankin (Stroke Patients Only)       Balance Overall balance assessment: Needs assistance Sitting-balance support: Feet supported Sitting balance-Leahy Scale: Good     Standing balance support: Bilateral upper extremity supported;During functional activity Standing balance-Leahy Scale: Poor                              Cognition Arousal/Alertness: Awake/alert Behavior During Therapy: WFL for tasks assessed/performed;Anxious Overall Cognitive Status: Within Functional Limits for tasks assessed                                        Exercises      General Comments        Pertinent Vitals/Pain Pain Assessment: 0-10 Pain Score: 3  Pain Location: L hip and groin Pain Descriptors / Indicators: Tightness Pain Intervention(s): Monitored during session;Repositioned    Home Living                      Prior Function            PT Goals (current  goals can now be found in the care plan section) Acute Rehab PT Goals PT Goal Formulation: With patient Time For Goal Achievement: 05/29/19 Potential to Achieve Goals: Good Progress towards PT goals: Progressing toward goals    Frequency    Min 4X/week      PT Plan Current plan remains appropriate    Co-evaluation              AM-PAC PT "6 Clicks" Mobility   Outcome Measure  Help needed turning from your back to your side while in a flat bed without using bedrails?: A Little Help needed moving from lying on your back to sitting on the side of a flat bed without using bedrails?: None Help needed moving to and from a bed to a chair (including  a wheelchair)?: A Little Help needed standing up from a chair using your arms (e.g., wheelchair or bedside chair)?: A Little Help needed to walk in hospital room?: A Little Help needed climbing 3-5 steps with a railing? : A Lot 6 Click Score: 18    End of Session   Activity Tolerance: Patient tolerated treatment well Patient left: in chair;with call bell/phone within reach;with family/visitor present Nurse Communication: Mobility status PT Visit Diagnosis: Difficulty in walking, not elsewhere classified (R26.2);Pain Pain - Right/Left: Left Pain - part of body: Hip;Leg     Time: [id number] PT Time Calculation (min) (ACUTE ONLY): 17 min  Charges:  $Gait Training: 8-22 mins                     Anastasio Champion, DPT  Acute Rehabilitation Services Pager [phone] Office [place] 05/20/2019, 4:42 PM

## 2019-05-20 NOTE — Progress Notes (Signed)
Went to review patient's pain this afternoon. Patient rates pain 3/10 out of severity.  Per RN patient has not received Dilaudid PCA dose had increased.  RN reported that oxycodone made patient "loopy".  Patient expressed to me that she did not want to go to CIR however per PTs previous note she was happy to go to CIR tomorrow.  I feel that this was the side effect from the oxycodone.   We will discontinue the PCA given patient's improved pain. Will start Percocet 10-650 mg Q6H and 10mg  Oxy IR 6PRN.   Lattie Haw PGY-1, [place]

## 2019-05-20 NOTE — Progress Notes (Signed)
Family Medicine Teaching Service Daily Progress Note Intern Pager: [phone]  Patient name: [name] Medical record number: [id number] Date of birth: December 26, 1950 Age: 69 y.o. Gender: female  Primary Care Provider: Richarda Osmond, DO Consultants: none Code Status: dnr/dni  Pt Overview and Major Events to Date:  3/6 admitted to fpts  Assessment and Plan: [name] is a 69 y.o. female presenting with 2-week history of left hip pain from known pubic rami fracture.  Unable to ambulate at home, not felt safe to go home.  Left hip pain/fracture to left inferior and superior pubic rami, non operative management  Pain today 2/10. Significantly improved after starting Dilaudid PCA -PT/OT: 24 hr assistance-hopeful for CIR, pt does not want SNF -Social work to assist with placement once eval performed -Tylenol 1000mg  Q6H -Lovenox for DVT prophylaxis -Continue Vitamin D: 50,000 units every 7 days  -Continue calcium supplementation -Continue Dilaudid PCA, 0.1mg  basal, 0.2mg  demand, max 0.6 per hour  -Oxy IR 10mg  Q6H -Continue Gabapentin  -Continue Baclofen 5mg  TID  -Ice to affected areas of pain  -If pain is not well controlled despite PCA and above medications can consider an epidural  Normocytic anemia-stable Hemoglobin 12.5 today,12.4> 11.1>11.6>11.4>10.8>10.9. Likely 2/2 secondary to Plavix and bleeding into pelvis from hip fracture. -Lovenox for DVT prophylaxis -Daily CBC -Monitor for any signs symptoms of bleeding  History of TIA No symptoms this point.  Can follow-up with neurology or PCP discuss necessity of Plavix going forward.  History of migraines Denies headache. Currently taking propranolol, Zoloft, zonergan.  -Continue Zoloft 100 mg daily.  -Continue Zonergan 100 mg twice daily -Propranolol 10 mg twice daily  Hyperlipidemia Currently receiving Repatha 140 mg every 2 weeks.  Next lipid panel due in about 3 months, can follow PCP for this  issue.  Hypertension BP 123/82 have normalized since higher Bps likely related to pain  Home meds: propranolol, furosemide, ramipril -Monitor BP  -Continue propranolol 10 mg twice daily -Lasix discontinued today due to worsening AKI -Continue to hold Ramipril   AKI on CKD stage IIIa likely 2/2 toradol Cr 1.56 worsened today, 0.86>1.25>1.18>1.16 -Encourage oral hydration -BMP at 5 PM, if creatinine worsening we to represcribe Toradol -Monitor daily BMP  -IV fluids 121cc/hr  -Can discontinue ramipril if GFR decreases -Avoid nephrotoxic agents -Continue Lasix today  History of pulmonary embolus History of PE back in 2017. Was on Eliquis for period time, but this was discontinued years ago.  Monitor for any signs and symptoms of shortness of breath, chest pain, tachycardia while admitted.  GERD Asymptomatic -Continue Protonix 40 mg daily.  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  Home meds: Gabapentin 600 mg 3 times daily for this.   -Gabapentin dose increased to 600 mg morning, afternoon and 900 mg in the evening   FEN/GI: Regular PPx: Lovenox  Disposition: Likely CIR, pending clinical course  Subjective:  Pain significantly improved today.  Had a good restful night and appeared in better spirits today.  Objective: Temp:  [97.6 F (36.4 C)-98.5 F (36.9 C)] 98.4 F (36.9 C) (03/12 1348) Pulse Rate:  [79-87] 81 (03/12 1348) Resp:  [15-19] 19 (03/12 1348) BP: (117-160)/(82-93) 160/93 (03/12 1348) SpO2:  [95 %-99 %] 98 % (03/12 1348)    Physical Exam:  General: Pleasant alert and well-appearing 68 year old female  Cardio: Normal S1 and S2, RRR no murmurs or rubs.   Pulm: CTAB, normal work of breathing Abdomen: Bowel sounds normal. Abdomen soft  and non-tender.  Extremities: No peripheral edema. Warm/ well perfused.  Strong radial pulse Neuro: Cranial nerves grossly intact   Laboratory: Recent Labs  Lab  05/18/19 0420 05/19/19 0744 05/20/19 0558  WBC 11.1* 9.9 13.1*  HGB 11.8* 12.4 12.5  HCT 36.8 39.5 39.9  PLT 298 309 351   Recent Labs  Lab 05/17/19 0400 05/18/19 0420 05/19/19 0744 05/20/19 0558 05/20/19 1434  NA 139   < > 140 133* 132*  K 4.1   < > 4.1 4.2 4.2  CL 98   < > 101 89* 89*  CO2 29   < > 28 29 30   BUN 19   < > 19 31* 35*  CREATININE 1.18*   < > 0.86 1.56* 1.68*  CALCIUM 9.2   < > 8.9 8.9 8.7*  PROT 6.3*  --   --   --   --   BILITOT 0.5  --   --   --   --   ALKPHOS 162*  --   --   --   --   ALT 29  --   --   --   --   AST 26  --   --   --   --   GLUCOSE 114*   < > 115* 108* 113*   < > = values in this interval not displayed.    Imaging/Diagnostic Tests: No new imaging  Lattie Haw, MD 05/20/2019, 3:22 PM PGY-1, [place] Intern pager: [phone], text pages welcome

## 2019-05-20 NOTE — Discharge Summary (Addendum)
[facility] Discharge Summary  Patient name: [name] Medical record number: [id number] Date of birth: 1950/06/14 Age: 69 y.o. Gender: female Date of Admission: 05/14/2019  Date of Discharge: 05/21/2019 Admitting Physician: Dickie La, MD  Primary Care Provider: Richarda Osmond, DO Consultants: Orthopedics  Indication for Hospitalization:   Pelvic Fracture, left Left inferior and superior pubic rami fracture  Discharge Diagnoses/Problem List:  Left hip pain Fracture to left inferior and superior pubic rami Normocytic anemia History of TIA History of migraines Hyperlipidemia Hypertension CKD stage IIIa History of pulmonary embolus GERD Cervical nerve pain  Disposition: CIR   Discharge Condition: Medically stable for discharge  Discharge Exam:   Gen: Alert and Oriented x 3, NAD HEENT: Normocephalic, atraumatic, PERRLA, EOMI, TM visible with good light reflex, non-swollen, non-erythematous turbinates, non-erythematous pharyngeal mucosa, no exudates Neck: trachea midline, no thyroidmegaly, no LAD CV: RRR, no murmurs, normal S1, S2 split Resp: CTAB, no wheezing, rales, or rhonchi, comfortable work of breathing Abd: non-distended, non-tender, soft, +bs in all four quadrants MSK: Moves all four extremities Ext: no clubbing, cyanosis, or edema Neuro: No gross deficits Skin: warm, dry, intact, no rashes  Brief Hospital Course:   Fracture to left inferior and superior pubic rami, pain  Patient was admitted to hospital on 3/6 following a fall on hard pavement 2 weeks prior to admission.  She developed progressive left-sided hip pain and difficulty ambulation since then.  She was evaluated in the orthopedics office and had an x-ray.  Was told she had a cracked left hip and was told that she could weight-bear on this injury.  Patient was being followed up as an outpatient by the orthopedist on 3/5 and was given a prescription for pain  medication.  She unfortunately had a fall at home morning she was admitted to hospital and stated that her "feet got out from under her". Work-up in the ED consisted of a BMP, CBC, COVID-19 test, CT cervical spine, CT head, hip x-ray.  All lab work was negative. CT cervical spine showing previous spinal fusions, but no acute intracranial pathology in the neck and head. Patient was admitted to hospital due to unable to ambulate.  Pain management was a large role in management of Ms Kindler's fracture Initially patient was put on Norco 10 mg every 8 hourly with morphine 2 mg every 4 hours as needed.  After consulting with orthopedics Toradol and baclofen was prescribed to the patient.  She was then switched to OxyContin with morphine every 2 hours for breakthrough pain.  Unfortunately this did not help patient with the pain and we switched her to IV Dilaudid 1 to 2 mg every 6 hours where patient did have some relief.  She was evaluated by PT and OT who recommended she would be a good candidate for CIR. Due to patient's significant left-sided lumbar, left hip and left leg pain there was concern for other underlying causes of patient's pain.  CT lumbar spine showed fracture of the left sacrum, pelvis dictated and separate.  There were no lumbar fractures. CT abdomen pelvis showed fractures as stated, possible constipation, and pelvic floor laxity suggested cystocele. Patient's pain persisted despite IV Dilaudid, she was switched to Dialudid PCA. MRI lumbar spine w/o contrast was ordered which showed no acute fracture of the lumbar spine. Multilevel degenerative changes of the lumbar spine as described above with severe spinal canal stenosis at L3-4 and L4-5. Moderate to severe bilateral neural foraminal narrowing at L5-S1. PCA  was discontinued on 3/12 as pain improved and pt was switched to Percocet 10-650 mg Q6H and 10mg  Oxy IR 6PRN.   On 3/14 patient was switched to Percocet 10-325 q4h , oxycontin 10mg  q4H (1-2  pills, oxycontin 10mg  BID. Patient reported pain score of 5/10 and was agreeable to discharge to CIR today.  On discharge patient's analgesia regime is: Oxycodone 10mg  scheduled and PRN percocet 5-325mg  and oxycodone 5mg .  Normocytic anemia Hb remained between 10-11. Likely 2/2 secondary to Plavix and bleeding into pelvis from hip fracture. Pt tolerated lovenox well without acute bleed so lovenox for DVT prophylaxis was continued. Hb on discharge was 11.1.  AKI on CKD stage IIIa likely 2/2 toradol Cr 1.6 on 3/12 secondary to toradol. Treated with IV fluids and avoid nephrotoxic agents (d/c Ramipril and Lasix).  Creatinine on discharge is 1.16.  Hypertension Bps were 180-190s during acute pain however pt did not experience neurological symptoms. Initially d/c Ramipril and Lasix due to AKI. Propranolol was continued. Rampiril restarted on discharge.   Pt is now medically stable for discharge to CIR.  Issues for Follow Up:  1. Monitor Cr 2. Monitor Hb  3. Monitor BP, did have BP 180s-190s 2/2 pain, lasix held due to AKI. Consider restarting.  4. Orthopedic follow-up with Dr Maureen Ralphs in 2-3 weeks. 5. Follow up with PCP after discharge from CIR     Significant Procedures:   None  Significant Labs and Imaging:  Recent Labs  Lab 05/21/19 0548 05/22/19 0350 05/23/19 0136  WBC 10.0 9.6 10.6*  HGB 11.5* 11.0* 11.1*  HCT 35.9* 34.7* 35.1*  PLT 252 244 255   Recent Labs  Lab 05/17/19 0400 05/18/19 0420 05/20/19 0558 05/20/19 0558 05/20/19 1434 05/20/19 1434 05/21/19 0548 05/21/19 0548 05/22/19 0350 05/23/19 0136  NA 139   < > 133*  --  132*  --  139  --  139 139  K 4.1   < > 4.2   < > 4.2   < > 5.1   < > 4.2 4.5  CL 98   < > 89*  --  89*  --  102  --  104 102  CO2 29   < > 29  --  30  --  29  --  27 27  GLUCOSE 114*   < > 108*  --  113*  --  114*  --  117* 109*  BUN 19   < > 31*  --  35*  --  31*  --  21 18  CREATININE 1.18*   < > 1.56*  --  1.68*  --  1.38*  --  0.86 1.16*   CALCIUM 9.2   < > 8.9  --  8.7*  --  8.9  --  8.9 8.7*  ALKPHOS 162*  --   --   --   --   --   --   --   --   --   AST 26  --   --   --   --   --   --   --   --   --   ALT 29  --   --   --   --   --   --   --   --   --   ALBUMIN 3.1*  --   --   --   --   --   --   --   --   --    < > =  values in this interval not displayed.   CT Head and Cervical Spine w/o contrast IMPRESSION: 1. No acute intracranial abnormality. 2. No cervical spine fractures identified. 3. Prior ACDF at C4-5 with a solid-appearing fusion. Apparent prior C5-6 and C6-7 interbody fusion, though the fusion at both levels is Incomplete.  DG Hip, Left IMPRESSION: Left pubic rami fractures and left sacral fracture.  CT Lumbar Spine and Pelvis w/o contrast IMPRESSION: No acute fracture of the lumbar spine. There is a fracture of the left sacrum. Possible constipation. Pelvic floor laxity with suggestion of mild cystocele. Aortic Atherosclerosis.  MRI Lumbar Spine w/o contrast IMPRESSION: 1. No acute fracture of the lumbar spine. 2. Multilevel degenerative changes of the lumbar spine as described above with severe spinal canal stenosis at L3-4 and L4-5. 3. Moderate to severe bilateral neural foraminal narrowing at L5-S1.   Results/Tests Pending at Time of Discharge: None  Discharge Medications:  Allergies as of 05/23/2019      Reactions   Fish Allergy Anaphylaxis   Shellfish Allergy Anaphylaxis      Medication List    STOP taking these medications   furosemide 40 MG tablet Commonly known as: LASIX   gabapentin 600 MG tablet Commonly known as: NEURONTIN Replaced by: gabapentin 300 MG capsule   HYDROcodone-acetaminophen 5-325 MG tablet Commonly known as: NORCO/VICODIN     TAKE these medications   alum & mag hydroxide-simeth 200-200-20 MG/5ML suspension Commonly known as: MAALOX/MYLANTA Take 5 mLs by mouth every 6 (six) hours as needed for indigestion or heartburn.   Baclofen 5 MG Tabs Take 5 mg by  mouth 3 (three) times daily.   calcium carbonate 1250 (500 Ca) MG tablet Commonly known as: OS-CAL - dosed in mg of elemental calcium Take 1 tablet (500 mg of elemental calcium total) by mouth daily with breakfast.   cetirizine 10 MG tablet Commonly known as: ZYRTEC Take 10 mg by mouth daily with breakfast.   clopidogrel 75 MG tablet Commonly known as: PLAVIX Take 75 mg by mouth daily with breakfast.   ELDERBERRY PO Take 1 tablet by mouth daily with breakfast.   gabapentin 300 MG capsule Commonly known as: NEURONTIN Take 2 capsules (600 mg total) by mouth 2 (two) times daily. Replaces: gabapentin 600 MG tablet   gabapentin 300 MG capsule Commonly known as: NEURONTIN Take 3 capsules (900 mg total) by mouth at bedtime.   Hair/Skin/Nails/Biotin Tabs Take 2 tablets by mouth daily with breakfast.   oxyCODONE 10 mg 12 hr tablet Commonly known as: OXYCONTIN Take 1 tablet (10 mg total) by mouth every 12 (twelve) hours.   oxyCODONE 5 MG immediate release tablet Commonly known as: Oxy IR/ROXICODONE Take 1 tablet (5 mg total) by mouth every 6 (six) hours as needed for breakthrough pain.   oxyCODONE-acetaminophen 5-325 MG tablet Commonly known as: PERCOCET/ROXICET Take 1 tablet by mouth every 4 (four) hours as needed for moderate pain.   pantoprazole 40 MG tablet Commonly known as: PROTONIX Take 40 mg by mouth daily with breakfast.   polyethylene glycol 17 g packet Commonly known as: MIRALAX / GLYCOLAX Take 17 g by mouth daily.   propranolol 10 MG tablet Commonly known as: INDERAL Take 1 tablet (10 mg total) by mouth 2 (two) times daily with a meal.   ramipril 2.5 MG capsule Commonly known as: ALTACE Take 1 capsule (2.5 mg total) by mouth daily with breakfast.   senna 8.6 MG Tabs tablet Commonly known as: SENOKOT Take 1 tablet (8.6 mg total) by mouth  daily.   sertraline 50 MG tablet Commonly known as: ZOLOFT Take 100 mg by mouth daily with breakfast. What changed:  Another medication with the same name was removed. Continue taking this medication, and follow the directions you see here.   Vitamin D (Ergocalciferol) 1.25 MG (50000 UNIT) Caps capsule Commonly known as: DRISDOL Take 1 capsule (50,000 Units total) by mouth every 7 (seven) days.   zonisamide 100 MG capsule Commonly known as: ZONEGRAN Take 1 capsule (100 mg total) by mouth 2 (two) times daily. What changed:   when to take this  Another medication with the same name was removed. Continue taking this medication, and follow the directions you see here.       Discharge Instructions: Please refer to Patient Instructions section of EMR for full details.  Patient was counseled important signs and symptoms that should prompt return to medical care, changes in medications, dietary instructions, activity restrictions, and follow up appointments.   Follow-Up Appointments: Follow-up Information    Call  [facility].   Why: As needed Contact information: [place] [phone]       [name] L, DO. Schedule an appointment as soon as possible for a visit in 1 week(s).   Specialty: Family Medicine Why: After you have been discharged from [facility] please call the [facility] to schedule a follow up appointment Contact information: [address] [address] [phone]           Lattie Haw, MD 05/23/2019, 2:15 PM PGY-1, [place]

## 2019-05-20 NOTE — Progress Notes (Addendum)
Inpatient Rehabilitation-Admissions Coordinator   Per Family Medicine, Pt may be off IV pain medicine by tomorrow. If pt is off all IV pain medications and her pain in controlled, pt would be ready for CIR admission. If pt continues to require IV medications, she will have to wait until Monday for IP Rehab reassessment.  Spoke with attending service who will assess medical readiness tomorrow and if ready, will place DC order for CIR on Saturday, 05/21/19. Our PM&R MD, Dr. Dagoberto Ligas will see Saturday morning to confirm readiness for CIR admit. Pt's RN can call [facility] nursing station ([phone]) by noon to get report.  Spoke with pt and her family member bedside with both aware of possible plan for CIR tomorrow. We reviewed insurance benefit letter and pt signed consent forms. No further questions form pt or family at this time.   Raechel Ache, OTR/L  Rehab Admissions Coordinator  [phone] 05/20/2019 5:23 PM

## 2019-05-21 LAB — CBC
HCT: 35.9 % — ABNORMAL LOW (ref 36.0–46.0)
Hemoglobin: 11.5 g/dL — ABNORMAL LOW (ref 12.0–15.0)
MCH: 27.7 pg (ref 26.0–34.0)
MCHC: 32 g/dL (ref 30.0–36.0)
MCV: 86.5 fL (ref 80.0–100.0)
Platelets: 252 10*3/uL (ref 150–400)
RBC: 4.15 MIL/uL (ref 3.87–5.11)
RDW: 14.2 % (ref 11.5–15.5)
WBC: 10 10*3/uL (ref 4.0–10.5)
nRBC: 0 % (ref 0.0–0.2)

## 2019-05-21 LAB — BASIC METABOLIC PANEL
Anion gap: 8 (ref 5–15)
BUN: 31 mg/dL — ABNORMAL HIGH (ref 8–23)
CO2: 29 mmol/L (ref 22–32)
Calcium: 8.9 mg/dL (ref 8.9–10.3)
Chloride: 102 mmol/L (ref 98–111)
Creatinine, Ser: 1.38 mg/dL — ABNORMAL HIGH (ref 0.44–1.00)
GFR calc Af Amer: 46 mL/min — ABNORMAL LOW (ref >60)
GFR calc non Af Amer: 39 mL/min — ABNORMAL LOW (ref >60)
Glucose, Bld: 114 mg/dL — ABNORMAL HIGH (ref 70–99)
Potassium: 5.1 mmol/L (ref 3.5–5.1)
Sodium: 139 mmol/L (ref 135–145)

## 2019-05-21 MED ORDER — DIPHENHYDRAMINE HCL 12.5 MG/5ML PO ELIX: 12.5000 mg | ORAL_SOLUTION | Freq: Four times a day (QID) | ORAL | Status: DC | PRN

## 2019-05-21 MED ORDER — DICLOFENAC SODIUM 1 % EX GEL
2.0000 g | Freq: Four times a day (QID) | CUTANEOUS | Status: DC
  Administered 2019-05-21 – 2019-05-23 (×9): 2 g via TOPICAL
  Filled 2019-05-21: qty 100

## 2019-05-21 MED ORDER — HYDROMORPHONE 1 MG/ML IV SOLN: INTRAVENOUS | Status: DC

## 2019-05-21 MED ORDER — OXYCODONE HCL 5 MG PO TABS: 5.0000 mg | ORAL_TABLET | ORAL | Status: DC | PRN

## 2019-05-21 MED ORDER — SODIUM CHLORIDE 0.9% FLUSH: 9.0000 mL | INTRAVENOUS | Status: DC | PRN

## 2019-05-21 MED ORDER — OXYCODONE HCL 5 MG PO TABS
5.0000 mg | ORAL_TABLET | ORAL | Status: DC | PRN
  Administered 2019-05-21: 10 mg via ORAL
  Administered 2019-05-22: 5 mg via ORAL
  Administered 2019-05-22 – 2019-05-23 (×3): 10 mg via ORAL
  Filled 2019-05-21 (×5): qty 2

## 2019-05-21 MED ORDER — DIPHENHYDRAMINE HCL 50 MG/ML IJ SOLN: 12.5000 mg | Freq: Four times a day (QID) | INTRAMUSCULAR | Status: DC | PRN

## 2019-05-21 MED ORDER — ONDANSETRON HCL 4 MG/2ML IJ SOLN: 4.0000 mg | Freq: Four times a day (QID) | INTRAMUSCULAR | Status: DC | PRN

## 2019-05-21 MED ORDER — OXYCODONE-ACETAMINOPHEN 5-325 MG PO TABS
1.0000 | ORAL_TABLET | ORAL | Status: DC | PRN
  Administered 2019-05-21 – 2019-05-23 (×7): 2 via ORAL
  Filled 2019-05-21 (×7): qty 2

## 2019-05-21 MED ORDER — OXYCODONE HCL ER 10 MG PO T12A
10.0000 mg | EXTENDED_RELEASE_TABLET | Freq: Two times a day (BID) | ORAL | Status: DC
  Administered 2019-05-21 – 2019-05-23 (×4): 10 mg via ORAL
  Filled 2019-05-21 (×4): qty 1

## 2019-05-21 MED ORDER — NALOXONE HCL 0.4 MG/ML IJ SOLN: 0.4000 mg | INTRAMUSCULAR | Status: DC | PRN

## 2019-05-21 NOTE — Plan of Care (Signed)
  Problem: Education: Goal: Knowledge of General Education information will improve Description: Including pain rating scale, medication(s)/side effects and non-pharmacologic comfort measures Outcome: Progressing   Problem: Health Behavior/Discharge Planning: Goal: Ability to manage health-related needs will improve Outcome: Progressing   Problem: Clinical Measurements: Goal: Will remain free from infection Outcome: Progressing Goal: Respiratory complications will improve Outcome: Progressing   Problem: Coping: Goal: Level of anxiety will decrease Outcome: Progressing   Problem: Pain Managment: Goal: General experience of comfort will improve Outcome: Progressing   Problem: Safety: Goal: Ability to remain free from injury will improve Outcome: Progressing   Problem: Skin Integrity: Goal: Risk for impaired skin integrity will decrease Outcome: Progressing

## 2019-05-21 NOTE — Progress Notes (Signed)
  Went to try and admit pt to CIR- however pain is still so uncontrolled, pt and daughter felt it wasn't possible.   Will aim for Monday.  I also suggested if current regimen (which it needs to be made clear to nursing that can use both percocet AND oxycodone) doesn't work within 24 hours, strongly suggest OxyContin 10 mg BID and then oxycodone or percocet 10 mg q4-6 hours- a long acting will likely help.   Also spoke to pt/daughter about this idea.   Also went over specific stretches and some exercises that might help pain. Showed to and performed for pt and her daughter-  Spent 30 minutes with pt all together.

## 2019-05-21 NOTE — Progress Notes (Signed)
FPTS Interim Progress Note  S: Patient is in 8/10 pain, uncontrolled with 10mg  oxycodine IR at 0830 and 10mg -650 of percocet at 1130, with gabapentin an baclofen as well.   O: BP (!) 178/85 (BP Location: Right Arm)   Pulse 89   Temp 98.4 F (36.9 C) (Oral)   Resp 17   Ht 5\' 7"  (1.702 m)   Wt 81.8 kg   SpO2 99%   BMI 28.24 kg/m     A/P: Restart hydromorphone PCA with 1mg  loading dose, 0.1mg  basal rate, 0.2mg  bolus dose, max per hour 0.6 mg.  -This is the only regimen that has controlled patient's pain thus far. Will titrate rate based on response tomorrow with goal of decreasing IV medication needs. -Hopeful that patient may be able to go to CIR on Monday  Gladys Damme, MD 05/21/2019, 1:50 PM PGY-1, [place] Medicine Service pager [phone]

## 2019-05-21 NOTE — Progress Notes (Signed)
Family Medicine Teaching Service Daily Progress Note Intern Pager: [phone]  Patient name: [name] Medical record number: [id number] Date of birth: 09-12-1950 Age: 69 y.o. Gender: female  Primary Care Provider: Richarda Osmond, DO Consultants: none Code Status: dnr/dni  Pt Overview and Major Events to Date:  3/6 admitted to fpts  Assessment and Plan: [name] is a 69 y.o. female presenting with 2-week history of left hip pain from known pubic rami fracture.  Unable to ambulate at home, not felt safe to go home.  Left hip pain/fracture to left inferior and superior pubic rami, non operative management  Pain today 7/10. Significantly improved after starting Dilaudid PCA -PT/OT: 24 hr assistance-hopeful for CIR, pt does not want SNF -Social work to assist with placement once eval performed -No more Toradol, has AKI -Lovenox for DVT prophylaxis -Continue Vitamin D: 50,000 units every 7 days  -Continue calcium supplementation -Cont Perocet 10-650mg  q6 scheduled -Oxy IR 10mg  Q6H -Continue Gabapentin 600 BID and 900 once at night time  -Continue Baclofen 5mg  TID  -Ice to affected areas of pain  -Can consider an epidural  Normocytic anemia-stable Hemoglobin 12.5 today,12.4> 11.1>11.6>11.4>10.8>10.9. Labs this am pending. Likely 2/2 secondary to Plavix and bleeding into pelvis from hip fracture. -Lovenox for DVT prophylaxis -Daily CBC -Monitor for any signs symptoms of bleeding  History of TIA No symptoms this point.  Can follow-up with neurology or PCP discuss necessity of Plavix going forward.  History of migraines Denies headache. Currently taking propranolol, Zoloft, zonergan.  -Continue Zoloft 100 mg daily.  -Continue Zonergan 100 mg twice daily -Propranolol 10 mg twice daily  Hyperlipidemia Currently receiving Repatha 140 mg every 2 weeks.  Next lipid panel due in about 3 months, can follow PCP for this issue.  Hypertension BP 132/72 this am. SBP  132-160 and DBP of 72-93 over past 24hrs.  Home meds: propranolol, furosemide, ramipril -Monitor BP  -Continue propranolol 10 mg twice daily -Lasix discontinued today due to worsening AKI -Continue to hold Ramipril   AKI on CKD stage IIIa likely 2/2 toradol Cr 1.56 worsened today, 0.86 > 1.25 > 1.18 > 1.16. BMP pending this am -Encourage oral hydration -BMP at 5 PM, if creatinine worsening we to represcribe Toradol -Monitor daily BMP  -IV fluids 121cc/hr  -Can discontinue ramipril if GFR decreases -Avoid nephrotoxic agents -Continue Lasix today  History of pulmonary embolus History of PE back in 2017. Was on Eliquis for period time, but this was discontinued years ago.  Monitor for any signs and symptoms of shortness of breath, chest pain, tachycardia while admitted.  GERD Asymptomatic -Continue Protonix 40 mg daily.  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  Home meds: Gabapentin 600 mg 3 times daily for this.   -Gabapentin dose increased to 600 mg morning, afternoon and 900 mg in the evening   FEN/GI: Regular PPx: Lovenox  Disposition: Likely CIR, pending clinical course  Subjective:  Pain not well controlled this am. She states it is a 7/10 but not changed in quality. Still having pain in her left hip and low back.  Objective: Temp:  [98 F (36.7 C)-98.4 F (36.9 C)] 98.1 F (36.7 C) (03/13 0347) Pulse Rate:  [79-87] 79 (03/13 0347) Resp:  [17-19] 17 (03/13 0347) BP: (132-160)/(72-93) 132/72 (03/13 0347) SpO2:  [95 %-98 %] 96 % (03/13 0347)    Physical Exam:  Gen: Alert and Oriented x 3, NAD CV: RRR, no murmurs, normal S1,  S2 split Resp: CTAB, no wheezing, rales, or rhonchi, comfortable work of breathing Abd: non-distended, non-tender, soft, +bs in all four quadrants Skin: warm, dry, intact, no rashes  Laboratory: Recent Labs  Lab 05/18/19 0420 05/19/19 0744 05/20/19 0558  WBC 11.1* 9.9 13.1*  HGB 11.8*  12.4 12.5  HCT 36.8 39.5 39.9  PLT 298 309 351   Recent Labs  Lab 05/17/19 0400 05/18/19 0420 05/19/19 0744 05/20/19 0558 05/20/19 1434  NA 139   < > 140 133* 132*  K 4.1   < > 4.1 4.2 4.2  CL 98   < > 101 89* 89*  CO2 29   < > 28 29 30   BUN 19   < > 19 31* 35*  CREATININE 1.18*   < > 0.86 1.56* 1.68*  CALCIUM 9.2   < > 8.9 8.9 8.7*  PROT 6.3*  --   --   --   --   BILITOT 0.5  --   --   --   --   ALKPHOS 162*  --   --   --   --   ALT 29  --   --   --   --   AST 26  --   --   --   --   GLUCOSE 114*   < > 115* 108* 113*   < > = values in this interval not displayed.    Imaging/Diagnostic Tests: No new imaging  Nuala Alpha, DO 05/21/2019, 5:45 AM PGY-3, [place] Intern pager: [phone], text pages welcome

## 2019-05-21 NOTE — Discharge Instructions (Signed)
Simple Pelvic Fracture, Adult A pelvic fracture is a break in one of the bones in the pelvis. The pelvic bones include the bones that you sit on and the bones that make up the lower part of your spine. A pelvic fracture is called simple if:  There is only one break.  The broken bone is stable.  The broken bone is not moving out of place.  The bone does not pierce the skin. A pelvic fracture may occur along with injuries to nerves, blood vessels, soft tissues, the urinary tract, and abdominal organs. What are the causes? Common causes of this type of fracture include:  A fall.  A car accident.  Force or pressure that hits the pelvis. What increases the risk? You are more likely to get this injury if you:  Play high-impact sports, such as rugby or football.  You have thinning or weakening of your bones, such as from osteopenia or osteoporosis.  Have cancer that has spread to the bone.  Have a condition that is associated with falling, such as Parkinson's disease or seizure.  Have had a stroke.  Smoke. What are the signs or symptoms? Signs and symptoms may include:  Tenderness, swelling, or bruising in the affected area.  Pain when moving the hip.  Pain when walking or standing. How is this diagnosed? This condition is diagnosed with a physical exam, X-ray, or CT scan. You may also have blood or urine tests:  To rule out damage to other organs, such as the urethra.  To check for internal bleeding in the pelvic area. How is this treated? The goal of treatment is to get the bone to heal in its original position. Treatment includes:  Staying in bed (bed rest).  Using crutches, a walker, or a wheelchair until the bone heals.  Medicines to treat pain.  Medicines to prevent blood clots from forming in your legs.  Physical therapy. Follow these instructions at home: Medicines  Take over-the-counter and prescription medicines only as told by your health care  provider.  Do not drive or use heavy machinery while taking prescription pain medicine. Managing pain, stiffness, and swelling   If directed, apply ice to the injured area: ? Put ice in a plastic bag. ? Place a towel between your skin and the bag. ? Leave the ice on for 20 minutes, 2-3 times a day.  Gently move your toes often to avoid stiffness and to lessen swelling. Activity  Stay on bed rest for as long as directed by your health care provider.  While on bed rest: ? Change the position of your legs every 1-2 hours. This keeps blood moving well through both of your legs. ? You may sit for as long as you feel comfortable.  After bed rest: ? Avoid strenuous activities for as long as directed by your health care provider. ? Return to your normal activities as directed by your health care provider. Ask your health care provider what activities are safe for you.  Use items to help you with your activities, such as: ? A long-handled shoehorn to help you put your shoes on. ? Elastic shoelaces that do not need to be retied. ? A reacher or grabber to pick items up off the floor. General instructions   Do not  drive or operate heavy machinery until your health care provider tells you it is safe to do so.  Use a wheelchair or assistive devices as directed by your health care provider. When you  are ready to walk, start by using crutches or a walker to help support your body weight.  Have someone help you at home as you recover.  Wear compression stockings as told by your health care provider.  Do not use any products that contain nicotine or tobacco, such as cigarettes and e-cigarettes. These can delay bone healing. If you need help quitting, ask your health care provider.  If you have an underlying condition that caused your pelvic fracture, work with your health care provider to manage your condition.  Keep all follow-up visits as told by your health care provider. This is  important. Contact a health care provider if:  Your pain gets worse.  Your pain is not relieved with medicines. Get help right away if you:  Feel light-headed or faint.  Develop chest pain.  Develop shortness of breath.  Have a fever.  Have blood in your urine or your stools.  Have bleeding in your vagina.  Have difficulty or pain with urination or with passing stool.  Have difficulty or increased pain with walking.  Have new or increased swelling in one of your legs.  Have numbness in your legs or groin area. Summary  A pelvic fracture is a break in one of the bones in the pelvis. These are the bones that you sit on and the bones that make up the lower part of your spine.  A pelvic fracture is called simple if there is only one break, the broken bone is stable, the broken bone is not moving out of place, or the bone does not pierce the skin.  Common causes of this type of fracture include a fall, a car accident, or a force or pressure that hits the pelvis.  The goal of treatment is to get the bone to heal in its original position.  Treatment includes bed rest and using a wheelchair. When ready to walk, you may use crutches or a walker until your bone heals. Other treatments include physical therapy and medicine to treat pain and prevent blood clots. This information is not intended to replace advice given to you by your health care provider. Make sure you discuss any questions you have with your health care provider. Document Revised: 10/01/2017 Document Reviewed: 04/06/2017 Elsevier Patient Education  [place] Prevention in the Home, Adult Falls can cause injuries and can affect people from all age groups. There are many simple things that you can do to make your home safe and to help prevent falls. Ask for help when making these changes, if needed. What actions can I take to prevent falls? General instructions  Use good lighting in all rooms.  Replace any light bulbs that burn out.  Turn on lights if it is dark. Use night-lights.  Place frequently used items in easy-to-reach places. Lower the shelves around your home if necessary.  Set up furniture so that there are clear paths around it. Avoid moving your furniture around.  Remove throw rugs and other tripping hazards from the floor.  Avoid walking on wet floors.  Fix any uneven floor surfaces.  Add color or contrast paint or tape to grab bars and handrails in your home. Place contrasting color strips on the first and last steps of stairways.  When you use a stepladder, make sure that it is completely opened and that the sides are firmly locked. Have someone hold the ladder while you are using it. Do not climb a closed stepladder.  Be aware  of any and all pets. What can I do in the bathroom?      Keep the floor dry. Immediately clean up any water that spills onto the floor.  Remove soap buildup in the tub or shower on a regular basis.  Use non-skid mats or decals on the floor of the tub or shower.  Attach bath mats securely with double-sided, non-slip rug tape.  If you need to sit down while you are in the shower, use a plastic, non-slip stool.  Install grab bars by the toilet and in the tub and shower. Do not use towel bars as grab bars. What can I do in the bedroom?  Make sure that a bedside light is easy to reach.  Do not use oversized bedding that drapes onto the floor.  Have a firm chair that has side arms to use for getting dressed. What can I do in the kitchen?  Clean up any spills right away.  If you need to reach for something above you, use a sturdy step stool that has a grab bar.  Keep electrical cables out of the way.  Do not use floor polish or wax that makes floors slippery. If you must use wax, make sure that it is non-skid floor wax. What can I do in the stairways?  Do not leave any items on the stairs.  Make sure that you have a  light switch at the top of the stairs and the bottom of the stairs. Have them installed if you do not have them.  Make sure that there are handrails on both sides of the stairs. Fix handrails that are broken or loose. Make sure that handrails are as long as the stairways.  Install non-slip stair treads on all stairs in your home.  Avoid having throw rugs at the top or bottom of stairways, or secure the rugs with carpet tape to prevent them from moving.  Choose a carpet design that does not hide the edge of steps on the stairway.  Check any carpeting to make sure that it is firmly attached to the stairs. Fix any carpet that is loose or worn. What can I do on the outside of my home?  Use bright outdoor lighting.  Regularly repair the edges of walkways and driveways and fix any cracks.  Remove high doorway thresholds.  Trim any shrubbery on the main path into your home.  Regularly check that handrails are securely fastened and in good repair. Both sides of any steps should have handrails.  Install guardrails along the edges of any raised decks or porches.  Clear walkways of debris and clutter, including tools and rocks.  Have leaves, snow, and ice cleared regularly.  Use sand or salt on walkways during winter months.  In the garage, clean up any spills right away, including grease or oil spills. What other actions can I take?  Wear closed-toe shoes that fit well and support your feet. Wear shoes that have rubber soles or low heels.  Use mobility aids as needed, such as canes, walkers, scooters, and crutches.  Review your medicines with your health care provider. Some medicines can cause dizziness or changes in blood pressure, which increase your risk of falling. Talk with your health care provider about other ways that you can decrease your risk of falls. This may include working with a physical therapist or trainer to improve your strength, balance, and endurance. Where to find  more information  Centers for Disease Control and Prevention, STEAD[employer]:  WebmailGuide.co.za  [employer] on Aging: BrainJudge.co.uk Contact a health care provider if:  You are afraid of falling at home.  You feel weak, drowsy, or dizzy at home.  You fall at home. Summary  There are many simple things that you can do to make your home safe and to help prevent falls.  Ways to make your home safe include removing tripping hazards and installing grab bars in the bathroom.  Ask for help when making these changes in your home. This information is not intended to replace advice given to you by your health care provider. Make sure you discuss any questions you have with your health care provider. Document Revised: 02/06/2017 Document Reviewed: 10/09/2016 Elsevier Patient Education  2020 [place] with your primary care doctor after discharge from CIR for reassessment, pain control, rehab discussion and possible wheelchair if needed. For severe pain take norco or vicodin however realize they have the potential for addiction and it can make you sleepy and has tylenol in it.  No operating machinery while taking.

## 2019-05-21 NOTE — Plan of Care (Signed)
  Problem: Skin Integrity: Goal: Risk for impaired skin integrity will decrease Outcome: Progressing   Problem: Safety: Goal: Ability to remain free from injury will improve Outcome: Progressing   Problem: Pain Managment: Goal: General experience of comfort will improve Outcome: Progressing   Problem: Coping: Goal: Level of anxiety will decrease Outcome: Progressing   Problem: Activity: Goal: Risk for activity intolerance will decrease Outcome: Progressing   Problem: Education: Goal: Knowledge of General Education information will improve Description: Including pain rating scale, medication(s)/side effects and non-pharmacologic comfort measures Outcome: Progressing   Problem: Health Behavior/Discharge Planning: Goal: Ability to manage health-related needs will improve Outcome: Progressing

## 2019-05-21 NOTE — Progress Notes (Signed)
FPTS Interim Progress Note  Called by RN that pt no longer wanted PCA due to improved pain using percocet and oxycodone. Patient stated that her pain is a 7/10, but had improved in the middle of the day since Dr. McDiarmid had seen her. Patient wished to restart previous medication. Will start oxycontin per PMR recommendations. -Percocet 5-325 q4h (1-2 pills) at 1530 -Oxycontin 5mg  q4H(1-2 pills) to begin at 1730 -Oxycontin 10mg  BID to begin at [place], MD 05/21/2019, 5:24 PM PGY-1, [facility] pager [phone]

## 2019-05-22 ENCOUNTER — Inpatient Hospital Stay (HOSPITAL_COMMUNITY): Payer: PRIVATE HEALTH INSURANCE | Admitting: Occupational Therapy

## 2019-05-22 ENCOUNTER — Inpatient Hospital Stay (HOSPITAL_COMMUNITY): Payer: PRIVATE HEALTH INSURANCE

## 2019-05-22 LAB — BASIC METABOLIC PANEL
Anion gap: 8 (ref 5–15)
BUN: 21 mg/dL (ref 8–23)
CO2: 27 mmol/L (ref 22–32)
Calcium: 8.9 mg/dL (ref 8.9–10.3)
Chloride: 104 mmol/L (ref 98–111)
Creatinine, Ser: 0.86 mg/dL (ref 0.44–1.00)
GFR calc Af Amer: >60 mL/min (ref >60)
GFR calc non Af Amer: >60 mL/min (ref >60)
Glucose, Bld: 117 mg/dL — ABNORMAL HIGH (ref 70–99)
Potassium: 4.2 mmol/L (ref 3.5–5.1)
Sodium: 139 mmol/L (ref 135–145)

## 2019-05-22 LAB — CBC
HCT: 34.7 % — ABNORMAL LOW (ref 36.0–46.0)
Hemoglobin: 11 g/dL — ABNORMAL LOW (ref 12.0–15.0)
MCH: 27.8 pg (ref 26.0–34.0)
MCHC: 31.7 g/dL (ref 30.0–36.0)
MCV: 87.8 fL (ref 80.0–100.0)
Platelets: 244 10*3/uL (ref 150–400)
RBC: 3.95 MIL/uL (ref 3.87–5.11)
RDW: 14.4 % (ref 11.5–15.5)
WBC: 9.6 10*3/uL (ref 4.0–10.5)
nRBC: 0 % (ref 0.0–0.2)

## 2019-05-22 NOTE — Plan of Care (Signed)
  Problem: Pain Managment: Goal: General experience of comfort will improve Outcome: Progressing   Problem: Safety: Goal: Ability to remain free from injury will improve Outcome: Progressing   Problem: Skin Integrity: Goal: Risk for impaired skin integrity will decrease Outcome: Progressing   

## 2019-05-22 NOTE — Progress Notes (Addendum)
Family Medicine Teaching Service Daily Progress Note Intern Pager: [phone]  Patient name: [name] Medical record number: [id number] Date of birth: 11-Nov-1950 Age: 69 y.o. Gender: female  Primary Care Provider: Richarda Osmond, DO Consultants: none Code Status: dnr/dni  Pt Overview and Major Events to Date:  3/6 admitted to fpts  Assessment and Plan: [name] is a 69 y.o. female presenting with 2-week history of left hip pain from known pubic rami fracture.  Unable to ambulate at home, not felt safe to go home.  Left hip pain/fracture to left inferior and superior pubic rami, non operative management  Pain is 3/10 severity. Pt is relieved she not requiring PCA anymore and wants to continue with oral analgesia. -PT/OT: 24 hr assistance-hopeful for CIR, pt does not want SNF -Social work to assist with placement once eval performed -No more Toradol, has AKI -Lovenox for DVT prophylaxis -Continue Vitamin D: 50,000 units every 7 days  -Continue calcium supplementation -Percocet 5-325 q4h (1-2 pills)  -Oxycontin 5mg  q4H(1-2 pills) -Oxycontin 10mg  BID  -Continue Gabapentin 600 BID and 900 once at night time  -Continue Baclofen 5mg  TID  -Ice to affected areas of pain  -Can consider an epidural  Normocytic anemia-stable Hemoglobin 11 today, stable  Likely 2/2 secondary to Plavix and bleeding into pelvis from hip fracture. -Lovenox for DVT prophylaxis -Daily CBC -Monitor for any signs symptoms of bleeding  History of TIA No symptoms this point.  Can follow-up with neurology or PCP discuss necessity of Plavix going forward.  History of migraines Denies headache. Currently taking propranolol, Zoloft, zonergan.  -Continue Zoloft 100 mg daily.  -Continue Zonergan 100 mg twice daily -Propranolol 10 mg twice daily  Hyperlipidemia Currently receiving Repatha 140 mg every 2 weeks.  Next lipid panel due in about 3 months, can follow PCP for this  issue.  Hypertension BP 148/82 this am. SBP 130-166 and DBP of 82-84 over past 24hrs.  Home meds: propranolol, furosemide, ramipril -Monitor BP  -Continue propranolol 10 mg twice daily -Lasix discontinued today due to worsening AKI -Continue to hold Ramipril   AKI on CKD stage IIIa likely 2/2 toradol Cr 0.86, 1.38 on 3/13 -Encourage oral hydration -Monitor daily BMP  -IV fluids 121cc/hr  -Avoid nephrotoxic agents -Holding Lasix, Ramipril  History of pulmonary embolus History of PE back in 2017. Was on Eliquis for period time, but this was discontinued years ago.  Monitor for any signs and symptoms of shortness of breath, chest pain, tachycardia while admitted.  GERD Asymptomatic -Continue Protonix 40 mg daily.  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  Home meds: Gabapentin 600 mg 3 times daily for this.   -Gabapentin dose increased to 600 mg morning, afternoon and 900 mg in the evening   FEN/GI: Regular PPx: Lovenox  Disposition: Likely CIR on 3/15  Subjective:  Pain is 3/10 severity. Pt is relieved she not requiring PCA anymore and wants to continue with oral analgesia.  Objective: Temp:  [98.2 F (36.8 C)-98.4 F (36.9 C)] 98.4 F (36.9 C) (03/14 0359) Pulse Rate:  [82-96] 86 (03/14 0753) Resp:  [16-17] 16 (03/14 0359) BP: (130-166)/(82-85) 148/82 (03/14 0753) SpO2:  [96 %-98 %] 96 % (03/14 0359) Weight:  [81.8 kg] 81.8 kg (03/13 0900)    Physical Exam:  General: Alert and cooperative and appears to be in no acute distress Cardio: Normal S1 and S2, RRR. No murmurs or rubs.   Pulm: Clear to auscultation bilaterally  anteriorly, no crackles, wheezing, or diminished breath sounds. Normal respiratory effort Abdomen: Bowel sounds normal. Abdomen soft and non-tender.  Extremities: No peripheral edema. Warm/ well perfused.  Strong radial pulse Neuro: Cranial nerves grossly intact  Laboratory: Recent Labs  Lab  05/20/19 0558 05/21/19 0548 05/22/19 0350  WBC 13.1* 10.0 9.6  HGB 12.5 11.5* 11.0*  HCT 39.9 35.9* 34.7*  PLT 351 252 244   Recent Labs  Lab 05/17/19 0400 05/18/19 0420 05/20/19 1434 05/21/19 0548 05/22/19 0350  NA 139   < > 132* 139 139  K 4.1   < > 4.2 5.1 4.2  CL 98   < > 89* 102 104  CO2 29   < > 30 29 27   BUN 19   < > 35* 31* 21  CREATININE 1.18*   < > 1.68* 1.38* 0.86  CALCIUM 9.2   < > 8.7* 8.9 8.9  PROT 6.3*  --   --   --   --   BILITOT 0.5  --   --   --   --   ALKPHOS 162*  --   --   --   --   ALT 29  --   --   --   --   AST 26  --   --   --   --   GLUCOSE 114*   < > 113* 114* 117*   < > = values in this interval not displayed.    Imaging/Diagnostic Tests: No new imaging  Courtney Haw, MD 05/22/2019, 8:14 AM PGY-1, [place] Intern pager: [phone], text pages welcome

## 2019-05-22 NOTE — Plan of Care (Signed)
  Problem: Education: Goal: Knowledge of General Education information will improve Description: Including pain rating scale, medication(s)/side effects and non-pharmacologic comfort measures Outcome: Progressing   Problem: Health Behavior/Discharge Planning: Goal: Ability to manage health-related needs will improve Outcome: Progressing   Problem: Clinical Measurements: Goal: Will remain free from infection Outcome: Progressing   Problem: Activity: Goal: Risk for activity intolerance will decrease Outcome: Progressing   Problem: Coping: Goal: Level of anxiety will decrease Outcome: Progressing   Problem: Pain Managment: Goal: General experience of comfort will improve Outcome: Progressing   Problem: Safety: Goal: Ability to remain free from injury will improve Outcome: Progressing   Problem: Skin Integrity: Goal: Risk for impaired skin integrity will decrease Outcome: Progressing

## 2019-05-23 ENCOUNTER — Other Ambulatory Visit: Payer: Self-pay

## 2019-05-23 ENCOUNTER — Inpatient Hospital Stay (HOSPITAL_COMMUNITY): Payer: PRIVATE HEALTH INSURANCE | Admitting: Occupational Therapy

## 2019-05-23 ENCOUNTER — Encounter (HOSPITAL_COMMUNITY): Payer: Self-pay | Admitting: Physical Medicine & Rehabilitation

## 2019-05-23 ENCOUNTER — Inpatient Hospital Stay (HOSPITAL_COMMUNITY): Payer: PRIVATE HEALTH INSURANCE | Admitting: Physical Therapy

## 2019-05-23 ENCOUNTER — Inpatient Hospital Stay (HOSPITAL_COMMUNITY)
Admission: RE | Admit: 2019-05-23 | Discharge: 2019-05-29 | DRG: 560 | Disposition: A | Discharge status: Home Health | Payer: Medicare Other | Source: Intra-hospital | Attending: Physical Medicine & Rehabilitation | Admitting: Physical Medicine & Rehabilitation

## 2019-05-23 DIAGNOSIS — S3282XS Multiple fractures of pelvis without disruption of pelvic ring, sequela: Secondary | ICD-10-CM | POA: Diagnosis not present

## 2019-05-23 DIAGNOSIS — Z79899 Other long term (current) drug therapy: Secondary | ICD-10-CM | POA: Diagnosis not present

## 2019-05-23 DIAGNOSIS — Z86711 Personal history of pulmonary embolism: Secondary | ICD-10-CM

## 2019-05-23 DIAGNOSIS — R7989 Other specified abnormal findings of blood chemistry: Secondary | ICD-10-CM | POA: Diagnosis not present

## 2019-05-23 DIAGNOSIS — E785 Hyperlipidemia, unspecified: Secondary | ICD-10-CM | POA: Diagnosis present

## 2019-05-23 DIAGNOSIS — K219 Gastro-esophageal reflux disease without esophagitis: Secondary | ICD-10-CM | POA: Diagnosis present

## 2019-05-23 DIAGNOSIS — S32810S Multiple fractures of pelvis with stable disruption of pelvic ring, sequela: Secondary | ICD-10-CM | POA: Diagnosis not present

## 2019-05-23 DIAGNOSIS — I10 Essential (primary) hypertension: Secondary | ICD-10-CM | POA: Diagnosis present

## 2019-05-23 DIAGNOSIS — Z87891 Personal history of nicotine dependence: Secondary | ICD-10-CM | POA: Diagnosis not present

## 2019-05-23 DIAGNOSIS — Z8673 Personal history of transient ischemic attack (TIA), and cerebral infarction without residual deficits: Secondary | ICD-10-CM | POA: Diagnosis not present

## 2019-05-23 DIAGNOSIS — K5901 Slow transit constipation: Secondary | ICD-10-CM | POA: Diagnosis not present

## 2019-05-23 DIAGNOSIS — M179 Osteoarthritis of knee, unspecified: Secondary | ICD-10-CM | POA: Diagnosis present

## 2019-05-23 DIAGNOSIS — N189 Chronic kidney disease, unspecified: Secondary | ICD-10-CM | POA: Diagnosis present

## 2019-05-23 DIAGNOSIS — Z90710 Acquired absence of both cervix and uterus: Secondary | ICD-10-CM | POA: Diagnosis not present

## 2019-05-23 DIAGNOSIS — S32592D Other specified fracture of left pubis, subsequent encounter for fracture with routine healing: Principal | ICD-10-CM

## 2019-05-23 DIAGNOSIS — N179 Acute kidney failure, unspecified: Secondary | ICD-10-CM | POA: Diagnosis present

## 2019-05-23 DIAGNOSIS — S3210XD Unspecified fracture of sacrum, subsequent encounter for fracture with routine healing: Secondary | ICD-10-CM

## 2019-05-23 DIAGNOSIS — I129 Hypertensive chronic kidney disease with stage 1 through stage 4 chronic kidney disease, or unspecified chronic kidney disease: Secondary | ICD-10-CM | POA: Diagnosis present

## 2019-05-23 DIAGNOSIS — M7989 Other specified soft tissue disorders: Secondary | ICD-10-CM | POA: Diagnosis not present

## 2019-05-23 DIAGNOSIS — S32402D Unspecified fracture of left acetabulum, subsequent encounter for fracture with routine healing: Secondary | ICD-10-CM | POA: Diagnosis not present

## 2019-05-23 DIAGNOSIS — S3282XA Multiple fractures of pelvis without disruption of pelvic ring, initial encounter for closed fracture: Secondary | ICD-10-CM | POA: Diagnosis present

## 2019-05-23 DIAGNOSIS — K5900 Constipation, unspecified: Secondary | ICD-10-CM | POA: Diagnosis present

## 2019-05-23 DIAGNOSIS — M7918 Myalgia, other site: Secondary | ICD-10-CM | POA: Diagnosis present

## 2019-05-23 DIAGNOSIS — W1830XD Fall on same level, unspecified, subsequent encounter: Secondary | ICD-10-CM

## 2019-05-23 DIAGNOSIS — M1710 Unilateral primary osteoarthritis, unspecified knee: Secondary | ICD-10-CM | POA: Diagnosis present

## 2019-05-23 DIAGNOSIS — R509 Fever, unspecified: Secondary | ICD-10-CM | POA: Diagnosis not present

## 2019-05-23 DIAGNOSIS — S32810D Multiple fractures of pelvis with stable disruption of pelvic ring, subsequent encounter for fracture with routine healing: Secondary | ICD-10-CM | POA: Diagnosis not present

## 2019-05-23 LAB — CBC
HCT: 35.1 % — ABNORMAL LOW (ref 36.0–46.0)
Hemoglobin: 11.1 g/dL — ABNORMAL LOW (ref 12.0–15.0)
MCH: 28 pg (ref 26.0–34.0)
MCHC: 31.6 g/dL (ref 30.0–36.0)
MCV: 88.4 fL (ref 80.0–100.0)
Platelets: 255 10*3/uL (ref 150–400)
RBC: 3.97 MIL/uL (ref 3.87–5.11)
RDW: 14.6 % (ref 11.5–15.5)
WBC: 10.6 10*3/uL — ABNORMAL HIGH (ref 4.0–10.5)
nRBC: 0 % (ref 0.0–0.2)

## 2019-05-23 LAB — BASIC METABOLIC PANEL
Anion gap: 10 (ref 5–15)
BUN: 18 mg/dL (ref 8–23)
CO2: 27 mmol/L (ref 22–32)
Calcium: 8.7 mg/dL — ABNORMAL LOW (ref 8.9–10.3)
Chloride: 102 mmol/L (ref 98–111)
Creatinine, Ser: 1.16 mg/dL — ABNORMAL HIGH (ref 0.44–1.00)
GFR calc Af Amer: 56 mL/min — ABNORMAL LOW (ref >60)
GFR calc non Af Amer: 49 mL/min — ABNORMAL LOW (ref >60)
Glucose, Bld: 109 mg/dL — ABNORMAL HIGH (ref 70–99)
Potassium: 4.5 mmol/L (ref 3.5–5.1)
Sodium: 139 mmol/L (ref 135–145)

## 2019-05-23 MED ORDER — ACETAMINOPHEN 325 MG PO TABS
325.0000 mg | ORAL_TABLET | ORAL | Status: DC | PRN
  Administered 2019-05-26: 325 mg via ORAL
  Administered 2019-05-27: 650 mg via ORAL
  Filled 2019-05-23 (×4): qty 2

## 2019-05-23 MED ORDER — DIPHENHYDRAMINE HCL 12.5 MG/5ML PO ELIX: 12.5000 mg | ORAL_SOLUTION | Freq: Four times a day (QID) | ORAL | Status: DC | PRN

## 2019-05-23 MED ORDER — PROCHLORPERAZINE EDISYLATE 10 MG/2ML IJ SOLN: 5.0000 mg | Freq: Four times a day (QID) | INTRAMUSCULAR | Status: DC | PRN

## 2019-05-23 MED ORDER — RAMIPRIL 2.5 MG PO CAPS
2.5000 mg | ORAL_CAPSULE | Freq: Every day | ORAL | Status: DC
  Administered 2019-05-24 – 2019-05-29 (×6): 2.5 mg via ORAL
  Filled 2019-05-23 (×6): qty 1

## 2019-05-23 MED ORDER — AMLODIPINE BESYLATE 2.5 MG PO TABS
2.5000 mg | ORAL_TABLET | Freq: Every day | ORAL | Status: DC
  Administered 2019-05-23 – 2019-05-29 (×7): 2.5 mg via ORAL
  Filled 2019-05-23 (×7): qty 1

## 2019-05-23 MED ORDER — OXYCODONE HCL ER 10 MG PO T12A: 10.0000 mg | EXTENDED_RELEASE_TABLET | Freq: Two times a day (BID) | ORAL | Status: DC

## 2019-05-23 MED ORDER — FLEET ENEMA 7-19 GM/118ML RE ENEM: 1.0000 | ENEMA | Freq: Once | RECTAL | Status: DC | PRN

## 2019-05-23 MED ORDER — ZONISAMIDE 100 MG PO CAPS
100.0000 mg | ORAL_CAPSULE | Freq: Two times a day (BID) | ORAL | Status: DC
  Administered 2019-05-23 – 2019-05-29 (×12): 100 mg via ORAL
  Filled 2019-05-23 (×13): qty 1

## 2019-05-23 MED ORDER — OXYCODONE HCL 5 MG PO TABS
5.0000 mg | ORAL_TABLET | ORAL | Status: DC | PRN
  Administered 2019-05-23 – 2019-05-29 (×16): 10 mg via ORAL
  Filled 2019-05-23 (×17): qty 2

## 2019-05-23 MED ORDER — RAMIPRIL 2.5 MG PO CAPS: 2.5000 mg | ORAL_CAPSULE | Freq: Every day | ORAL | Status: DC

## 2019-05-23 MED ORDER — VITAMIN D (ERGOCALCIFEROL) 1.25 MG (50000 UNIT) PO CAPS: 50000.0000 [IU] | ORAL_CAPSULE | ORAL | Status: DC

## 2019-05-23 MED ORDER — PROCHLORPERAZINE MALEATE 5 MG PO TABS: 5.0000 mg | ORAL_TABLET | Freq: Four times a day (QID) | ORAL | Status: DC | PRN

## 2019-05-23 MED ORDER — ACETAMINOPHEN 325 MG PO TABS
650.0000 mg | ORAL_TABLET | Freq: Three times a day (TID) | ORAL | Status: DC
  Administered 2019-05-23 – 2019-05-29 (×23): 650 mg via ORAL
  Filled 2019-05-23 (×23): qty 2

## 2019-05-23 MED ORDER — POLYETHYLENE GLYCOL 3350 17 G PO PACK: 17.0000 g | PACK | Freq: Every day | ORAL | 0 refills | Status: DC

## 2019-05-23 MED ORDER — POLYETHYLENE GLYCOL 3350 17 G PO PACK: 17.0000 g | PACK | Freq: Every day | ORAL | Status: DC | PRN

## 2019-05-23 MED ORDER — GUAIFENESIN-DM 100-10 MG/5ML PO SYRP: 5.0000 mL | ORAL_SOLUTION | Freq: Four times a day (QID) | ORAL | Status: DC | PRN

## 2019-05-23 MED ORDER — GABAPENTIN 300 MG PO CAPS: 600.0000 mg | ORAL_CAPSULE | Freq: Two times a day (BID) | ORAL | Status: DC

## 2019-05-23 MED ORDER — GABAPENTIN 300 MG PO CAPS: 900.0000 mg | ORAL_CAPSULE | Freq: Every day | ORAL | Status: DC

## 2019-05-23 MED ORDER — CALCIUM CARBONATE 1250 (500 CA) MG PO TABS: 1.0000 | ORAL_TABLET | Freq: Every day | ORAL | Status: DC

## 2019-05-23 MED ORDER — PANTOPRAZOLE SODIUM 40 MG PO TBEC
40.0000 mg | DELAYED_RELEASE_TABLET | Freq: Every day | ORAL | Status: DC
  Administered 2019-05-24 – 2019-05-29 (×6): 40 mg via ORAL
  Filled 2019-05-23 (×6): qty 1

## 2019-05-23 MED ORDER — SERTRALINE HCL 100 MG PO TABS
100.0000 mg | ORAL_TABLET | Freq: Every day | ORAL | Status: DC
  Administered 2019-05-24 – 2019-05-29 (×6): 100 mg via ORAL
  Filled 2019-05-23 (×6): qty 1

## 2019-05-23 MED ORDER — DICLOFENAC SODIUM 1 % EX GEL
2.0000 g | Freq: Four times a day (QID) | CUTANEOUS | Status: DC
  Administered 2019-05-23 – 2019-05-29 (×23): 2 g via TOPICAL
  Filled 2019-05-23 (×2): qty 100

## 2019-05-23 MED ORDER — OXYCODONE-ACETAMINOPHEN 5-325 MG PO TABS: 1.0000 | ORAL_TABLET | ORAL | Status: DC | PRN

## 2019-05-23 MED ORDER — VITAMIN D (ERGOCALCIFEROL) 1.25 MG (50000 UNIT) PO CAPS
50000.0000 [IU] | ORAL_CAPSULE | ORAL | Status: DC
  Filled 2019-05-23: qty 1

## 2019-05-23 MED ORDER — POLYETHYLENE GLYCOL 3350 17 G PO PACK
17.0000 g | PACK | Freq: Every day | ORAL | Status: DC
  Administered 2019-05-24 – 2019-05-25 (×2): 17 g via ORAL
  Filled 2019-05-23 (×5): qty 1

## 2019-05-23 MED ORDER — CLOPIDOGREL BISULFATE 75 MG PO TABS
75.0000 mg | ORAL_TABLET | Freq: Every day | ORAL | Status: DC
  Administered 2019-05-24 – 2019-05-29 (×6): 75 mg via ORAL
  Filled 2019-05-23 (×6): qty 1

## 2019-05-23 MED ORDER — SENNA 8.6 MG PO TABS
1.0000 | ORAL_TABLET | Freq: Every day | ORAL | Status: DC
  Administered 2019-05-23 – 2019-05-29 (×7): 8.6 mg via ORAL
  Filled 2019-05-23 (×7): qty 1

## 2019-05-23 MED ORDER — OXYCODONE HCL ER 10 MG PO T12A
10.0000 mg | EXTENDED_RELEASE_TABLET | Freq: Two times a day (BID) | ORAL | Status: DC
  Administered 2019-05-23 – 2019-05-29 (×12): 10 mg via ORAL
  Filled 2019-05-23 (×12): qty 1

## 2019-05-23 MED ORDER — ENOXAPARIN SODIUM 40 MG/0.4ML ~~LOC~~ SOLN
40.0000 mg | SUBCUTANEOUS | Status: DC
  Administered 2019-05-23 – 2019-05-28 (×6): 40 mg via SUBCUTANEOUS
  Filled 2019-05-23 (×6): qty 0.4

## 2019-05-23 MED ORDER — SENNA 8.6 MG PO TABS: 1.0000 | ORAL_TABLET | Freq: Every day | ORAL | 0 refills | Status: DC

## 2019-05-23 MED ORDER — TRAZODONE HCL 50 MG PO TABS: 25.0000 mg | ORAL_TABLET | Freq: Every evening | ORAL | Status: DC | PRN

## 2019-05-23 MED ORDER — OXYCODONE HCL 5 MG PO TABS: 5.0000 mg | ORAL_TABLET | Freq: Four times a day (QID) | ORAL | Status: DC | PRN

## 2019-05-23 MED ORDER — ALUM & MAG HYDROXIDE-SIMETH 200-200-20 MG/5ML PO SUSP
30.0000 mL | ORAL | Status: DC | PRN
  Administered 2019-05-26: 30 mL via ORAL
  Filled 2019-05-23: qty 30

## 2019-05-23 MED ORDER — PROCHLORPERAZINE 25 MG RE SUPP: 12.5000 mg | Freq: Four times a day (QID) | RECTAL | Status: DC | PRN

## 2019-05-23 MED ORDER — BACLOFEN 5 MG PO TABS: 5.0000 mg | ORAL_TABLET | Freq: Three times a day (TID) | ORAL | Status: DC

## 2019-05-23 MED ORDER — OXYCODONE HCL 5 MG PO TABS: 5.0000 mg | ORAL_TABLET | Freq: Four times a day (QID) | ORAL | 0 refills | Status: DC | PRN

## 2019-05-23 MED ORDER — TRAMADOL HCL 50 MG PO TABS
50.0000 mg | ORAL_TABLET | Freq: Four times a day (QID) | ORAL | Status: DC | PRN
  Administered 2019-05-25 – 2019-05-29 (×10): 50 mg via ORAL
  Filled 2019-05-23 (×10): qty 1

## 2019-05-23 MED ORDER — BISACODYL 10 MG RE SUPP: 10.0000 mg | Freq: Every day | RECTAL | Status: DC | PRN

## 2019-05-23 MED ORDER — CALCIUM CARBONATE 1250 (500 CA) MG PO TABS
1.0000 | ORAL_TABLET | Freq: Every day | ORAL | Status: DC
  Administered 2019-05-24 – 2019-05-29 (×6): 500 mg via ORAL
  Filled 2019-05-23 (×6): qty 1

## 2019-05-23 MED ORDER — OXYCODONE-ACETAMINOPHEN 5-325 MG PO TABS: 1.0000 | ORAL_TABLET | ORAL | 0 refills | Status: DC | PRN

## 2019-05-23 MED ORDER — ALUM & MAG HYDROXIDE-SIMETH 200-200-20 MG/5ML PO SUSP: 5.0000 mL | Freq: Four times a day (QID) | ORAL | 0 refills | Status: DC | PRN

## 2019-05-23 MED ORDER — ZONISAMIDE 100 MG PO CAPS: 100.0000 mg | ORAL_CAPSULE | Freq: Two times a day (BID) | ORAL | Status: DC

## 2019-05-23 MED ORDER — PROPRANOLOL HCL 10 MG PO TABS: 10.0000 mg | ORAL_TABLET | Freq: Two times a day (BID) | ORAL | Status: DC

## 2019-05-23 MED ORDER — GABAPENTIN 300 MG PO CAPS
600.0000 mg | ORAL_CAPSULE | Freq: Two times a day (BID) | ORAL | Status: DC
  Administered 2019-05-24 – 2019-05-29 (×12): 600 mg via ORAL
  Filled 2019-05-23 (×12): qty 2

## 2019-05-23 MED ORDER — PROPRANOLOL HCL 10 MG PO TABS
10.0000 mg | ORAL_TABLET | Freq: Two times a day (BID) | ORAL | Status: DC
  Administered 2019-05-23 – 2019-05-29 (×12): 10 mg via ORAL
  Filled 2019-05-23 (×12): qty 1

## 2019-05-23 MED ORDER — BACLOFEN 5 MG HALF TABLET
5.0000 mg | ORAL_TABLET | Freq: Three times a day (TID) | ORAL | Status: DC
  Administered 2019-05-23 – 2019-05-29 (×18): 5 mg via ORAL
  Filled 2019-05-23 (×18): qty 1

## 2019-05-23 MED ORDER — GABAPENTIN 300 MG PO CAPS
900.0000 mg | ORAL_CAPSULE | Freq: Every day | ORAL | Status: DC
  Administered 2019-05-23 – 2019-05-28 (×6): 900 mg via ORAL
  Filled 2019-05-23 (×6): qty 3

## 2019-05-23 NOTE — Progress Notes (Signed)
Inpatient Rehabilitation-Admissions Coordinator   I have received medical clearance from attending service for admit to CIR today. Pt feeling much better with pain more controlled today. Pt would like to pursue the program today. RN and [facility] team aware of plan. Will review new consent forms with pt.   Raechel Ache, OTR/L  Rehab Admissions Coordinator  [phone] 05/23/2019 12:35 PM

## 2019-05-23 NOTE — Progress Notes (Signed)
Izora Ribas, MD  Physician  Physical Medicine and Rehabilitation  PMR Pre-admission  Signed  Date of Service:  05/17/2019 10:58 AM      Related encounter: ED to Hosp-Admission (Discharged) from 05/14/2019 in [place]        PMR Admission Coordinator Pre-Admission Assessment   Patient: [name] is an 69 y.o., female MRN: [id number] DOB: 01-Sep-1950 Height: 5' 7"  (170.2 cm) Weight: 81.8 kg   Insurance Information HMO:     PPO:      PCP:      IPA:      80/20: yes     OTHER:  PRIMARY: medicare A and B      Policy#: [id number]      Subscriber: Patient CM Name:       Phone#:      Fax#:  Pre-Cert#:       Employer:  Benefits:  Phone #: online     Name: verified eligibility online via [place] on 05/17/19 Eff. Date: Part A and B effective 03/10/16     Deduct: $1,484      Out of Pocket Max: NA      Life Max: NA CIR: Covered per Medicare guidelines once yearly deductible has been met      SNF: days 1-20, 100%, days 21-100, 80% Outpatient: 80%     Co-Pay: 20% Home Health: 100%      Co-Pay:  DME: 80%     Co-Pay: 20% Providers: Pt's choice SECONDARY: [employer]      Policy#: [id number]      Subscriber: Patient CM Name:       Phone#:      Fax#:  Pre-Cert#:       Employer:  Benefits:  Phone #: [phone]     Name:  Eff. Date:      Deduct:       Out of Pocket Max:       Life Max:  CIR:       SNF: Outpatient:     Co-Pay: Home Health:      Co-Pay:  DME:     Co-Pay:    Medicaid Application Date:       Case Manager:  Disability Application Date:       Case Worker:    The "Data Collection Information Summary" for patients in Inpatient Rehabilitation Facilities with attached "Privacy Act [place] Records" was provided and verbally reviewed with: Patient and Family   Emergency Contact Information         Contact Information     Name Relation Home Work Merrick, danny Brother     [phone]   kaeden, depaz Spouse     [phone]    PHINLEY, SCHALL Daughter     [phone]         Current Medical History  Patient Admitting Diagnosis: Functional debility due to multiple falls with multiple medical issues.    History of Present Illness: Pt is a 69 yo female with history of HTN, migraine, TIA, PE, PVD, GERD, OA of knee, CKD stage III, anxiety, and previous cervical fusion. The pt was admitted to the hospital on 05/14/19 with a 2 week history of left hip pain from  known pubic rami and sacratl fractures after a fall. She was told her fracture was non-operative. Pt was unable to ambulate safely at home and per pt had another fall that ultimately brought her in  for assistance. Ortho was consulted who again recommended nonoperative management of her fractures but did change her to TTWB LLE. Pt has been having difficulty with pain control and throughout her hospital stay has been requiring IV dialudid and eventual PCA Dilaudid due to persistent and worsening pain. Pt has been followed for normocytic anemia with attending service monitoring for symptoms of bleeding. She has also shown to have HTN most likely due to significant pain. Pt also with AKI on CKD stage III most likely due to toradol use with attending service managing fluids and monitoring daily BMP. Pt has been evaluated by therapies with recommendation for CIR. Pt is to admit to CIR on 05/23/19.      Patient's medical record from [facility] has been reviewed by the rehabilitation admission coordinator and physician.   Past Medical History      Past Medical History:  Diagnosis Date  . Anemia    . Cervical neuritis    . Dyslipidemia    . GERD (gastroesophageal reflux disease)    . History of transient ischemic attack (TIA)    . HTN (hypertension)    . Migraine    . Pulmonary embolism ([place]) 2017      Family History   family history includes Alzheimer's disease in her mother; Cancer in her mother; Prostate  cancer in her father.   Prior Rehab/Hospitalizations Has the patient had prior rehab or hospitalizations prior to admission? No   Has the patient had major surgery during 100 days prior to admission? No               Current Medications   Current Facility-Administered Medications:  .  alum & mag hydroxide-simeth (MAALOX/MYLANTA) 200-200-20 MG/5ML suspension 5 mL, 5 mL, Oral, Q6H PRN, McDiarmid, Blane Ohara, MD, 5 mL at 05/20/19 1411 .  baclofen (LIORESAL) tablet 5 mg, 5 mg, Oral, TID, Gladys Damme, MD, 5 mg at 05/23/19 0827 .  calcium carbonate (OS-CAL - dosed in mg of elemental calcium) tablet 500 mg of elemental calcium, 1 tablet, Oral, Q breakfast, Posey Pronto, Poonam, MD, 500 mg of elemental calcium at 05/23/19 0827 .  clopidogrel (PLAVIX) tablet 75 mg, 75 mg, Oral, Q breakfast, Guadalupe Dawn, MD, 75 mg at 05/23/19 0826 .  diclofenac Sodium (VOLTAREN) 1 % topical gel 2 g, 2 g, Topical, QID, Gladys Damme, MD, 2 g at 05/23/19 1043 .  enoxaparin (LOVENOX) injection 40 mg, 40 mg, Subcutaneous, Q24H, Guadalupe Dawn, MD, 40 mg at 05/22/19 2105 .  gabapentin (NEURONTIN) capsule 600 mg, 600 mg, Oral, BID, 600 mg at 05/23/19 0827 **AND** gabapentin (NEURONTIN) capsule 900 mg, 900 mg, Oral, QHS, Gladys Damme, MD, 900 mg at 05/22/19 2105 .  oxyCODONE-acetaminophen (PERCOCET/ROXICET) 5-325 MG per tablet 1 tablet, 1 tablet, Oral, Q4H PRN **AND** oxyCODONE (Oxy IR/ROXICODONE) immediate release tablet 5 mg, 5 mg, Oral, Q6H PRN, Anderson, Chelsey L, DO .  oxyCODONE (OXYCONTIN) 12 hr tablet 10 mg, 10 mg, Oral, Q12H, Gladys Damme, MD, 10 mg at 05/23/19 1042 .  pantoprazole (PROTONIX) EC tablet 40 mg, 40 mg, Oral, Q breakfast, Guadalupe Dawn, MD, 40 mg at 05/23/19 0831 .  polyethylene glycol (MIRALAX / GLYCOLAX) packet 17 g, 17 g, Oral, Daily, Lattie Haw, MD, 17 g at 05/23/19 0826 .  propranolol (INDERAL) tablet 10 mg, 10 mg, Oral, BID WC, Guadalupe Dawn, MD, 10 mg at 05/23/19 5027 .  [START ON  05/24/2019] ramipril (ALTACE) capsule 2.5 mg, 2.5 mg, Oral, Q breakfast, Anderson, Chelsey L, DO .  senna (SENOKOT) tablet 8.6 mg, 1 tablet, Oral, Daily, Anderson, Chelsey L, DO, 8.6 mg at 05/23/19 0827 .  sertraline (ZOLOFT) tablet 100 mg, 100 mg, Oral, Q breakfast, Guadalupe Dawn, MD, 100 mg at 05/23/19 0827 .  Vitamin D (Ergocalciferol) (DRISDOL) capsule 50,000 Units, 50,000 Units, Oral, Q7 days, Meccariello, Bernita Raisin, DO, 50,000 Units at 05/22/19 1653 .  zonisamide (ZONEGRAN) capsule 100 mg, 100 mg, Oral, BID, Guadalupe Dawn, MD, 100 mg at 05/23/19 7096   Patients Current Diet:     Diet Order                      Diet regular Room service appropriate? Yes; Fluid consistency: Thin  Diet effective now                   Precautions / Restrictions Precautions Precautions: Fall Restrictions Weight Bearing Restrictions: Yes LLE Weight Bearing: Touchdown weight bearing Other Position/Activity Restrictions: per Ortho    Has the patient had 2 or more falls or a fall with injury in the past year? Yes   Prior Activity Level Community (5-7x/wk): working as Charity fundraiser, drove PTA, Independent PTA. no AD use before fall two weeks ago (began using RW and [facility] for mobility due to decreased functional ambuation status)   Prior Functional Level Self Care: Did the patient need help bathing, dressing, using the toilet or eating? Independent   Indoor Mobility: Did the patient need assistance with walking from room to room (with or without device)? Independent   Stairs: Did the patient need assistance with internal or external stairs (with or without device)? Independent   Functional Cognition: Did the patient need help planning regular tasks such as shopping or remembering to take medications? [place] / Equipment Home Assistive Devices/Equipment: Eyeglasses, Radio producer (specify quad or straight), Walker (specify type), Brace (specify type) Home Equipment: Walker - 2  wheels, Cane - single point   Prior Device Use: Indicate devices/aids used by the patient prior to current illness, exacerbation or injury? none prior to 2 weeks ago (used RW/SPC after first fall)   Current Functional Level Cognition   Overall Cognitive Status: Within Functional Limits for tasks assessed Orientation Level: Oriented X4    Extremity Assessment (includes Sensation/Coordination)   Upper Extremity Assessment: Overall WFL for tasks assessed  Lower Extremity Assessment: Defer to PT evaluation RLE Deficits / Details: +pain in left pelvis with supineAROM hip/knee flexion 90/90, ankle DF 5/5 RLE: Unable to fully assess due to pain RLE Sensation: WNL LLE Deficits / Details: supine AROM hip/knee flexion 60, ankle ROM WNL; could not tolerate ankle DF MMT LLE: Unable to fully assess due to pain LLE Sensation: (sensory changes L4-5 distribution)     ADLs   Overall ADL's : Needs assistance/impaired Eating/Feeding: Set up, Independent, Sitting Grooming: Wash/dry hands, Wash/dry face, Min guard, Standing Upper Body Bathing: Min guard, Sitting Lower Body Bathing: Minimal assistance, Sit to/from stand, +2 for physical assistance, +2 for safety/equipment Upper Body Dressing : Set up, Sitting Lower Body Dressing: Minimal assistance, +2 for physical assistance, +2 for safety/equipment, Sit to/from stand Toilet Transfer: Minimal assistance, Min guard, Ambulation, RW, BSC, Cueing for safety Toilet Transfer Details (indicate cue type and reason): simulated to the recliner Toileting- Clothing Manipulation and Hygiene: Minimal assistance, Sit to/from stand Toileting - Clothing Manipulation Details (indicate cue type and reason): modA for posterior pericare Functional mobility during ADLs: Minimal assistance, Min guard, Rolling walker, Cueing for safety General ADL Comments:  pt reported dizziness with initial sitting;BP in chart (see vital signs section);she required assistance for posterior  pericare in standing due to reliance on BUE support on RW;stand pivot to the recliner followed by 5 steps forward;decreased activity tolerance, limited by pain      Mobility   Overal bed mobility: Needs Assistance Bed Mobility: Supine to Sit Supine to sit: HOB elevated, Supervision General bed mobility comments: HOB 40 degrees with rail to pivot to left. Pt declined attempting with bed flat     Transfers   Overall transfer level: Needs assistance Equipment used: Rolling walker (2 wheeled) Transfers: Sit to/from Stand Sit to Stand: Supervision Stand pivot transfers: Min guard General transfer comment: good hand placement and stabilization. cues for toe out with turning to sit to decrease pain     Ambulation / Gait / Stairs / Wheelchair Mobility   Ambulation/Gait Ambulation/Gait assistance: Counsellor (Feet): 90 Feet Assistive device: Rolling walker (2 wheeled) Gait Pattern/deviations: Step-to pattern General Gait Details: pt with maintained TDWB LLE throughout with fatigue limited by increasing pain with gait up to 9/10 in left hip with distance per pt and pt self directing distance Gait velocity: decr Gait velocity interpretation: 1.31 - 2.62 ft/sec, indicative of limited community ambulator Stairs: (pt declined attempting)     Posture / Balance Dynamic Sitting Balance Sitting balance - Comments: better midline posture and positioning today Balance Overall balance assessment: Needs assistance Sitting-balance support: Feet supported Sitting balance-Leahy Scale: Good Sitting balance - Comments: better midline posture and positioning today Standing balance support: Bilateral upper extremity supported, During functional activity Standing balance-Leahy Scale: Fair Standing balance comment: Briefly stood at sink; Essentially stayed in R single limb stance, with L toes touching down     Special needs/care consideration BiPAP/CPAP : no CPM : no Continuous Drip IV :  no Dialysis : no        Days : no Life Vest : no Oxygen : no Special Bed : no Trach Size : no Wound Vac (area) : no      Location : no Skin : ecchymosis to left arm                           Bowel mgmt: last charted 05/21/19 Bladder mgmt: continent, external urinary catheter in place Diabetic mgmt: no Behavioral consideration : no Chemo/radiation : no    Previous Home Environment (from acute therapy documentation) Living Arrangements: Spouse/significant other Available Help at Discharge: Family Type of Home: House Home Layout: One level Home Access: Stairs to enter Entrance Stairs-Rails: Right, Left, Can reach both Entrance Stairs-Number of Steps: 2 Bathroom Toilet: Handicapped height [place]: No   Discharge Living Setting Plans for Discharge Living Setting: Patient's home, Lives with (comment)(lives with husband ) Type of Home at Discharge: House Discharge Home Layout: One level Discharge Home Access: Stairs to enter Entrance Stairs-Rails: Right Entrance Stairs-Number of Steps: 2 steps to get onto porch and 1 step for thershold into house Discharge Bathroom Shower/Tub: Tub/shower unit(has handrail) Discharge Bathroom Toilet: Handicapped height Discharge Bathroom Accessibility: Yes How Accessible: Accessible via walker Does the patient have any problems obtaining your medications?: No   Social/Family/Support Systems Patient Roles: Spouse, Other (Comment)(private duty sitter ) Contact Information: Mikeal Hawthorne (husband): [phone] Anticipated Caregiver: Mikeal Hawthorne (husband) Anticipated Caregiver's Contact Information: see above Ability/Limitations of Caregiver: Supervision  Caregiver Availability: 24/7 Discharge Plan Discussed with Primary Caregiver: Yes Is Caregiver In Agreement with Plan?: Yes Does Caregiver/Family  have Issues with Lodging/Transportation while Pt is in Rehab?: No   Goals/Additional Needs Patient/Family Goal for Rehab: PT/OT: Mod I; SLP:  NA Expected length of stay: 5-7 days Cultural Considerations: [employer] Dietary Needs: regular diet, thin liquids  Equipment Needs: TBD Special Service Needs: NA Pt/Family Agrees to Admission and willing to participate: Yes Program Orientation Provided & Reviewed with Pt/Caregiver Including Roles  & Responsibilities: Yes(pt and her husband)  Barriers to Discharge: Home environment access/layout, Lack of/limited family support  Barriers to Discharge Comments: steps to enter, husband can only do Supervision (no hands on assistance due to back issues).    Decrease burden of Care through IP rehab admission: NA    Possible need for SNF placement upon discharge: Not anticipated. Anticipate can reach Mod I level after CIR stay and with improved pain control. Pt's husband is able to provide some Supervision as needed.    Patient Condition: I have reviewed medical records from [facility], spoken with RN, and patient and spouse. I met with patient at the bedside for inpatient rehabilitation assessment.  Patient will benefit from ongoing PT and OT, can actively participate in 3 hours of therapy a day 5 days of the week, and can make measurable gains during the admission.  Patient will also benefit from the coordinated team approach during an Inpatient Acute Rehabilitation admission.  The patient will receive intensive therapy as well as Rehabilitation physician, nursing, social worker, and care management interventions.  Due to safety, skin/wound care, disease management, medication administration, pain management and patient education the patient requires 24 hour a day rehabilitation nursing.  The patient is currently Min G with mobility and Min A/G for basic ADLs.  Discharge setting and therapy post discharge at home with home health is anticipated.  Patient has agreed to participate in the Acute Inpatient Rehabilitation Program and will admit today.   Preadmission Screen Completed By:  Raechel Ache, 05/23/2019 12:30 PM ______________________________________________________________________   Discussed status with Dr. Ranell Patrick on 05/23/19 at 12:29PM and received approval for admission today.   Admission Coordinator:  Raechel Ache, OT, time 12:29PM/Date 05/23/19    Assessment/Plan: Diagnosis: Pelvic fractures 1. Does the need for close, 24 hr/day Medical supervision in concert with the patient's rehab needs make it unreasonable for this patient to be served in a less intensive setting? Yes 2. Co-Morbidities requiring supervision/potential complications: normocytic anemia, TIAs, cervical neuritis, PE, migraines, fall 2 weeks prior to admission 3. Due to bladder management, bowel management, safety, skin/wound care, disease management, medication administration, pain management and patient education, does the patient require 24 hr/day rehab nursing? Yes 4. Does the patient require coordinated care of a physician, rehab nurse, PT, OT, and SLP to address physical and functional deficits in the context of the above medical diagnosis(es)? Yes Addressing deficits in the following areas: balance, endurance, locomotion, strength, transferring, bowel/bladder control, bathing, dressing, feeding, grooming, toileting and psychosocial support 5. Can the patient actively participate in an intensive therapy program of at least 3 hrs of therapy 5 days a week? Yes 6. The potential for patient to make measurable gains while on inpatient rehab is excellent 7. Anticipated functional outcomes upon discharge from inpatient rehab: modified independent PT, modified independent OT, independent SLP 8. Estimated rehab length of stay to reach the above functional goals is: 5-7 days 9. Anticipated discharge destination: Home 10. Overall Rehab/Functional Prognosis: excellent     MD Signature: Leeroy Cha, MD        Revision History Date/Time User  Provider Type Action  05/23/2019 12:37 PM Raulkar, Clide Deutscher, MD  Physician Sign  05/23/2019 12:32 PM Raechel Ache, OT Rehab Admission Coordinator Share  05/23/2019 12:31 PM Raechel Ache, OT Rehab Admission Coordinator Share  05/21/2019  8:01 AM Raechel Ache, OT Rehab Admission Coordinator Share  05/20/2019  7:08 PM Raechel Ache, [place] Rehab Admission Coordinator Share   View Details Report

## 2019-05-23 NOTE — Progress Notes (Signed)
Report given to 4 [facility] Nurse.

## 2019-05-23 NOTE — Progress Notes (Signed)
Inpatient Rehabilitation Medication Review by a Pharmacist  A complete drug regimen review was completed for this patient to identify any potential clinically significant medication issues.  Clinically significant medication issues were identified:  no  Name of provider notified: Reesa Chew  Method of Notification: Secure Chat   Pharmacist comments: Spoke to Olin Hauser about the discrepancy regarding oxycodone order on discharge summary vs. What is currently ordered. Olin Hauser explained that she is aware and made the change to wean the patient down to tramadol.  Time spent performing this drug regimen review (minutes):  10 minutes  Sherren Kerns, PharmD PGY1 Acute Care Pharmacy Resident 05/23/2019 5:10 PM

## 2019-05-23 NOTE — Plan of Care (Signed)
  Problem: Education: Goal: Verbalization of understanding the information provided (i.e., activity precautions, restrictions, etc) will improve Outcome: Progressing Goal: Individualized Educational Video(s) Outcome: Progressing   Problem: Activity: Goal: Ability to ambulate and perform ADLs will improve Description: Patient will be able to ambulate with mod I assist Outcome: Progressing   Problem: Clinical Measurements: Goal: Postoperative complications will be avoided or minimized Outcome: Progressing   Problem: Self-Concept: Goal: Ability to maintain and perform role responsibilities to the fullest extent possible will improve Description: Patient will be able to perform with mod I assist  Outcome: Progressing   Problem: Pain Management: Goal: Pain level will decrease Outcome: Progressing   Problem: Consults Goal: RH GENERAL PATIENT EDUCATION Description: See Patient Education module for education specifics. Outcome: Progressing Goal: Skin Care Protocol Initiated - if Braden Score 18 or less Description: If consults are not indicated, leave blank or document N/A Outcome: Progressing Goal: Nutrition Consult-if indicated Outcome: Progressing Goal: Diabetes Guidelines if Diabetic/Glucose > 140 Description: If diabetic or lab glucose is > 140 mg/dl - Initiate Diabetes/Hyperglycemia Guidelines & Document Interventions  Outcome: Progressing   Problem: RH BOWEL ELIMINATION Goal: RH STG MANAGE BOWEL WITH ASSISTANCE Description: STG Manage Bowel with Assistance. Outcome: Progressing Goal: RH STG MANAGE BOWEL W/MEDICATION W/ASSISTANCE Description: STG Manage Bowel with Medication with Assistance. Outcome: Progressing   Problem: RH BLADDER ELIMINATION Goal: RH STG MANAGE BLADDER WITH ASSISTANCE Description: STG Manage Bladder With Assistance Outcome: Progressing Goal: RH STG MANAGE BLADDER WITH MEDICATION WITH ASSISTANCE Description: STG Manage Bladder With Medication With  Assistance. Outcome: Progressing Goal: RH STG MANAGE BLADDER WITH EQUIPMENT WITH ASSISTANCE Description: STG Manage Bladder With Equipment With Assistance Outcome: Progressing   Problem: RH SKIN INTEGRITY Goal: RH STG SKIN FREE OF INFECTION/BREAKDOWN Outcome: Progressing Goal: RH STG MAINTAIN SKIN INTEGRITY WITH ASSISTANCE Description: STG Maintain Skin Integrity With Assistance. Outcome: Progressing Goal: RH STG ABLE TO PERFORM INCISION/WOUND CARE W/ASSISTANCE Description: STG Able To Perform Incision/Wound Care With Assistance. Outcome: Progressing   Problem: RH SAFETY Goal: RH STG ADHERE TO SAFETY PRECAUTIONS W/ASSISTANCE/DEVICE Description: STG Adhere to Safety Precautions With Assistance/Device. Outcome: Progressing Goal: RH STG DECREASED RISK OF FALL WITH ASSISTANCE Description: STG Decreased Risk of Fall With Assistance. Outcome: Progressing   Problem: RH PAIN MANAGEMENT Goal: RH STG PAIN MANAGED AT OR BELOW PT'S PAIN GOAL Outcome: Progressing   Problem: RH KNOWLEDGE DEFICIT GENERAL Goal: RH STG INCREASE KNOWLEDGE OF SELF CARE AFTER HOSPITALIZATION Outcome: Progressing   Problem: RH Vision Goal: RH LTG Vision (Specify) Outcome: Progressing   Problem: RH Pre-functional/Other (Specify) Goal: RH LTG Pre-functional (Specify) Outcome: Progressing Goal: RH LTG Interdisciplinary (Specify) 1 Description: RH LTG Interdisciplinary (Specify)1 Outcome: Progressing Goal: RH LTG Interdisciplinary (Specify) 2 Description: RH LTG Interdisciplinary (Specify) 2  Outcome: Progressing

## 2019-05-23 NOTE — H&P (Signed)
Physical Medicine and Rehabilitation Admission H&P    Chief Complaint  Patient presents with  . Functional decline due to pelvic fractures.     HPI: [name] is a 69 year old female with history of normocytic anemia, TIA's, HTN< cervical neuritis, PE, migraines, fall 2 weeks PTA with subsequent pelvic fractures. She was admitted 05/14/19 for pain control, inability to ambulate and difficulty being managed at home. CT head was negative for acute changes and CT spine showed solid fusion C4/5 C5/6 and C6/7.  She continued to have issues with pain control and mobility. CT lumbar and pelvic spine done for work up and showed chronic compression Fx L1 with <50% deformity and minimally displaced fracture left pubis, left acetabular and left sacrum with extension into left SI joint.  Ortho consulted for input and recommended TTWB LLE. Gabapentin added and titrated upwards. IV dilaudid weaned to oxycodone by 3/12 and sheToradol IV added 3/11.   Review of Systems  Constitutional: Negative for chills and fever.  HENT: Negative for hearing loss and tinnitus.   Eyes: Negative for blurred vision and double vision.  Respiratory: Negative for cough and shortness of breath.   Cardiovascular: Negative for chest pain and leg swelling.  Gastrointestinal: Negative for constipation, heartburn and nausea.  Genitourinary: Negative for dysuria.  Musculoskeletal: Positive for joint pain.  Skin: Negative for itching and rash.  Neurological: Positive for weakness.  Psychiatric/Behavioral: The patient is not nervous/anxious and does not have insomnia.      Past Medical History:  Diagnosis Date  . Anemia   . Cervical neuritis   . Chronic kidney disease    stage 3  . Dyslipidemia   . GERD (gastroesophageal reflux disease)   . History of transient ischemic attack (TIA)   . HTN (hypertension)   . Hyperlipidemia   . Migraine   . Migraine headache   . Occipital neuralgia    Left  . Pulmonary  embolism ([place]) 2017  . Renal insufficiency   . Stroke [facility]) 10/2013     Past Surgical History:  Procedure Laterality Date  . ABDOMINAL HYSTERECTOMY    . BILIARY STENT PLACEMENT N/A 12/12/2015   Procedure: BILIARY STENT PLACEMENT;  Surgeon: Doran Stabler, MD;  Location: [facility];  Service: Endoscopy;  Laterality: N/A;  . BLADDER REPAIR    . CARPAL TUNNEL RELEASE    . CERVICAL FUSION    . CERVICAL SPINE SURGERY    . CHOLECYSTECTOMY N/A 12/06/2015   Procedure: LAPAROSCOPIC CHOLECYSTECTOMY WITH  INTRAOPERATIVE CHOLANGIOGRAM;  Surgeon: Stark Klein, MD;  Location: [facility];  Service: General;  Laterality: N/A;  . ELBOW SURGERY    . ERCP N/A 12/12/2015   Procedure: ENDOSCOPIC RETROGRADE CHOLANGIOPANCREATOGRAPHY (ERCP);  Surgeon: Doran Stabler, MD;  Location: [facility] ENDOSCOPY;  Service: Endoscopy;  Laterality: N/A;  . ESOPHAGOGASTRODUODENOSCOPY N/A 01/28/2016   Procedure: ESOPHAGOGASTRODUODENOSCOPY (EGD) Biliary STENT removal;  Surgeon: Doran Stabler, MD;  Location: [facility];  Service: Gastroenterology;  Laterality: N/A;  . IR GENERIC HISTORICAL  12/17/2015   IR US GUIDE BX ASP/DRAIN 12/17/2015 [facility]  . IR GENERIC HISTORICAL  12/17/2015   IR GUIDED DRAIN W CATHETER PLACEMENT 12/17/2015 [facility]  . IR GENERIC HISTORICAL  12/17/2015   IR SINUS/FIST TUBE CHK-NON GI 12/17/2015 [facility]  . KNEE SURGERY    . LUNG SURGERY    . TEE WITHOUT CARDIOVERSION N/A 12/19/2015   Procedure: TRANSESOPHAGEAL ECHOCARDIOGRAM (TEE);  Surgeon: Josue Hector, MD;  Location:  [facility];  Service: Cardiovascular;  Laterality: N/A;      Family History  Problem Relation Age of Onset  . Cancer Mother 44       breast  . Alzheimer's disease Mother   . Prostate cancer Father   . Cancer Brother   . Hyperlipidemia Brother   . Lung cancer Father   . Dementia Maternal Grandmother   . Cancer Maternal Grandfather   . Cancer Brother   . Hyperlipidemia Brother   . Dementia Brother         frontotemporal dementia     Social History:   Married. Independent without AD PTA. She reports that she has quit smoking--10/2011. Smoked 1/3 PPD. She has never used smokeless tobacco. She does not use  alcohol or drugs.    Allergies  Allergen Reactions  . Fish Allergy Anaphylaxis  . Iodinated Diagnostic Agents Shortness Of Breath and Itching    States she had this reaction connected with a MRI of the cervical spine.  . Shellfish Allergy Anaphylaxis and Other (See Comments)    All seafood  . Shellfish Allergy Anaphylaxis  . Statins Other (See Comments)    Arthralgias (severe) with atorvastatin, mild arthralgias with rosuvastatin but willing to continue taking it   Medications Prior to Admission  Medication Sig Dispense Refill  . alum & mag hydroxide-simeth (MAALOX/MYLANTA) 200-200-20 MG/5ML suspension Take 5 mLs by mouth every 6 (six) hours as needed for indigestion or heartburn. 355 mL 0  . Baclofen 5 MG TABS Take 5 mg by mouth 3 (three) times daily.    . calcium carbonate (OS-CAL - DOSED IN MG OF ELEMENTAL CALCIUM) 1250 (500 Ca) MG tablet Take 1 tablet (500 mg of elemental calcium total) by mouth daily with breakfast.    . cetirizine (ZYRTEC) 10 MG tablet Take 10 mg by mouth daily with breakfast.    . clopidogrel (PLAVIX) 75 MG tablet Take 1 tablet (75 mg total) by mouth daily. 90 tablet 3  . clopidogrel (PLAVIX) 75 MG tablet Take 75 mg by mouth daily with breakfast.    . doxepin (SINEQUAN) 100 MG capsule Take 2 capsules (200 mg total) by mouth at bedtime. 180 capsule 1  . ELDERBERRY PO Take 1 tablet by mouth daily with breakfast.    . Evolocumab (REPATHA SURECLICK) [id number] MG/ML SOAJ Inject 140 mg into the skin every 14 (fourteen) days. 23 pen 6  . ezetimibe (ZETIA) 10 MG tablet Take 10 mg by mouth daily.     . furosemide (LASIX) 40 MG tablet Take 40 mg by mouth daily.    Marland Kitchen gabapentin (NEURONTIN) 300 MG capsule Take 2 capsules (600 mg total) by mouth 2 (two) times daily.    Marland Kitchen gabapentin  (NEURONTIN) 300 MG capsule Take 3 capsules (900 mg total) by mouth at bedtime.    . gabapentin (NEURONTIN) 600 MG tablet Take 1 tablet (600 mg total) by mouth 2 (two) times daily. 180 tablet 3  . lidocaine (LIDODERM) 5 % Place 2 patches onto the skin daily. Apply to 2 patches to affected area in the AM, then remove after 12 hours 60 patch 5  . Multiple Vitamins-Minerals (HAIR/SKIN/NAILS/BIOTIN) TABS Take 2 tablets by mouth daily with breakfast.    . ondansetron (ZOFRAN) 4 MG tablet Take 1 tablet (4 mg total) by mouth every 8 (eight) hours as needed for nausea or vomiting. 20 tablet 0  . oxyCODONE (OXY IR/ROXICODONE) 5 MG immediate release tablet Take 1 tablet (5 mg total) by mouth every 6 (  six) hours as needed for breakthrough pain. 30 tablet 0  . oxyCODONE (OXYCONTIN) 10 mg 12 hr tablet Take 1 tablet (10 mg total) by mouth every 12 (twelve) hours. 14 tablet   . oxyCODONE-acetaminophen (PERCOCET/ROXICET) 5-325 MG tablet Take 1 tablet by mouth every 4 (four) hours as needed for moderate pain. 30 tablet 0  . pantoprazole (PROTONIX) 40 MG tablet TAKE 1 TABLET(40 MG) BY MOUTH TWICE DAILY 90 tablet 0  . pantoprazole (PROTONIX) 40 MG tablet Take 40 mg by mouth daily with breakfast.    . polyethylene glycol (MIRALAX / GLYCOLAX) 17 g packet Take 17 g by mouth daily. 14 each 0  . propranolol (INDERAL) 10 MG tablet TAKE 1 TABLET(10 MG) BY MOUTH TWICE DAILY 90 tablet 3  . propranolol (INDERAL) 10 MG tablet Take 1 tablet (10 mg total) by mouth 2 (two) times daily with a meal.    . ramipril (ALTACE) 2.5 MG capsule Take 1 capsule (2.5 mg total) by mouth daily with breakfast.    . Rimegepant Sulfate (NURTEC) 75 MG TBDP Take 75 mg by mouth daily as needed (take for abortive therapy of migraine, no more than 1 tablet in 24 hours or 10 per month). 10 tablet 11  . senna (SENOKOT) 8.6 MG TABS tablet Take 1 tablet (8.6 mg total) by mouth daily. 120 tablet 0  . sertraline (ZOLOFT) 50 MG tablet TAKE 1 TABLET BY MOUTH DAILY  MAY TAKE ADDITIONAL TABLET IF ANXIETY WORSENS. 90 tablet 1  . sertraline (ZOLOFT) 50 MG tablet Take 100 mg by mouth daily with breakfast.    . tiZANidine (ZANAFLEX) 2 MG tablet TAKE 1 TABLET(2 MG) BY MOUTH THREE TIMES DAILY 270 tablet 1  . Vitamin D, Ergocalciferol, (DRISDOL) 1.25 MG (50000 UNIT) CAPS capsule Take 1 capsule (50,000 Units total) by mouth every 7 (seven) days. 5 capsule   . zonisamide (ZONEGRAN) 100 MG capsule TAKE 1 CAPSULE(100 MG) BY MOUTH TWICE DAILY 60 capsule 2  . zonisamide (ZONEGRAN) 100 MG capsule Take 1 capsule (100 mg total) by mouth 2 (two) times daily.    Marland Kitchen zonisamide (ZONEGRAN) 25 MG capsule take 25 mg twice daily prn in addition to 100 mg twice daily. 60 capsule 11    Drug Regimen Review  Drug regimen was reviewed and remains appropriate with no significant issues identified  Home: Home Living Family/patient expects to be discharged to:: Private residence Living Arrangements: Spouse/significant other Available Help at Discharge: Family Type of Home: House Home Access: Stairs to enter Technical brewer of Steps: 2 Entrance Stairs-Rails: Right, Left, Can reach both Home Layout: One level Bathroom Toilet: Handicapped height Home Equipment: Environmental consultant - 2 wheels, Cane - single point   Functional History: Prior Function Level of Independence: Independent Comments: prior to fall  Functional Status:  Mobility: Bed Mobility Overal bed mobility: Needs Assistance Bed Mobility: Supine to Sit Supine to sit: Min assist General bed mobility comments: pt OOB in recliner chair upon arrival Transfers Overall transfer level: Needs assistance Equipment used: Rolling walker (2 wheeled) Transfers: Sit to/from Stand Sit to Stand: Min guard Stand pivot transfers: Min guard General transfer comment: good technique utilized, min guard for transition into standing from chair Ambulation/Gait Ambulation/Gait assistance: Min assist, Min guard Gait Distance (Feet): 50  Feet Assistive device: Rolling walker (2 wheeled) Gait Pattern/deviations: Step-to pattern, Decreased stance time - left, Antalgic General Gait Details: pt able to maintain TDWB'ing L LE independently throughout; mostly min guard overall, min A for safety and stability with navigation around  obstacles in room Gait velocity: decr  ADL: ADL Overall ADL's : Needs assistance/impaired Eating/Feeding: Set up, Independent, Sitting Grooming: Wash/dry hands, Wash/dry face, Min guard, Standing Upper Body Bathing: Min guard, Sitting Lower Body Bathing: Minimal assistance, Sit to/from stand, +2 for physical assistance, +2 for safety/equipment Upper Body Dressing : Set up, Sitting Lower Body Dressing: Minimal assistance, +2 for physical assistance, +2 for safety/equipment, Sit to/from stand Toilet Transfer: Minimal assistance, Min guard, Ambulation, RW, BSC, Cueing for safety Toilet Transfer Details (indicate cue type and reason): simulated to the recliner Toileting- Clothing Manipulation and Hygiene: Minimal assistance, Sit to/from stand Toileting - Clothing Manipulation Details (indicate cue type and reason): modA for posterior pericare Functional mobility during ADLs: Minimal assistance, Min guard, Rolling walker, Cueing for safety General ADL Comments: pt reported dizziness with initial sitting;BP in chart (see vital signs section);she required assistance for posterior pericare in standing due to reliance on BUE support on RW;stand pivot to the recliner followed by 5 steps forward;decreased activity tolerance, limited by pain   Cognition: Cognition Overall Cognitive Status: Within Functional Limits for tasks assessed Orientation Level: Oriented X4 Cognition Arousal/Alertness: Awake/alert Behavior During Therapy: WFL for tasks assessed/performed, Anxious Overall Cognitive Status: Within Functional Limits for tasks assessed  Physical Exam: There were no vitals taken for this visit.  Physical  Exam  General: Alert and oriented x 3, No apparent distress HEENT: Head is normocephalic, atraumatic, PERRLA, EOMI, sclera anicteric, oral mucosa pink and moist, dentition intact, ext ear canals clear,  Neck: Supple without JVD or lymphadenopathy Heart: Reg rate and rhythm. No murmurs rubs or gallops Chest: CTA bilaterally without wheezes, rales, or rhonchi; no distress Abdomen: Soft, non-tender, non-distended, bowel sounds positive. Extremities: No clubbing, cyanosis, or edema. Pulses are 2+ Skin: Clean and intact without signs of breakdown Neuro: Pt is cognitively appropriate with normal insight, memory, and awareness. Cranial nerves 2-12 are intact. Sensory exam is normal. Reflexes are 2+ in all 4's. Fine motor coordination is intact. No tremors. Motor function is grossly 5/5 with the exception of left lower extremity- TTWB status.  Psych: Pt's affect is appropriate. Pt is cooperative   Results for orders placed or performed during the hospital encounter of 05/14/19 (from the past 48 hour(s))  CBC     Status: Abnormal   Collection Time: 05/22/19  3:50 AM  Result Value Ref Range   WBC 9.6 4.0 - 10.5 K/uL   RBC 3.95 3.87 - 5.11 MIL/uL   Hemoglobin 11.0 (L) 12.0 - 15.0 g/dL   HCT 34.7 (L) 36.0 - 46.0 %   MCV 87.8 80.0 - 100.0 fL   MCH 27.8 26.0 - 34.0 pg   MCHC 31.7 30.0 - 36.0 g/dL   RDW 14.4 11.5 - 15.5 %   Platelets 244 150 - 400 K/uL   nRBC 0.0 0.0 - 0.2 %    Comment: Performed at [facility], [place] [address], [place], [place] [id number]  Basic metabolic panel     Status: Abnormal   Collection Time: 05/22/19  3:50 AM  Result Value Ref Range   Sodium 139 135 - 145 mmol/L   Potassium 4.2 3.5 - 5.1 mmol/L   Chloride 104 98 - 111 mmol/L   CO2 27 22 - 32 mmol/L   Glucose, Bld 117 (H) 70 - 99 mg/dL    Comment: Glucose reference range applies only to samples taken after fasting for at least 8 hours.   BUN 21 8 - 23 mg/dL   Creatinine, Ser 0.86  0.44 - 1.00 mg/dL   Calcium 8.9  8.9 - 10.3 mg/dL   GFR calc non Af Amer >60 >60 mL/min   GFR calc Af Amer >60 >60 mL/min   Anion gap 8 5 - 15    Comment: Performed at [facility] [address], [place], [place] [address]  CBC     Status: Abnormal   Collection Time: 05/23/19  1:36 AM  Result Value Ref Range   WBC 10.6 (H) 4.0 - 10.5 K/uL   RBC 3.97 3.87 - 5.11 MIL/uL   Hemoglobin 11.1 (L) 12.0 - 15.0 g/dL   HCT 35.1 (L) 36.0 - 46.0 %   MCV 88.4 80.0 - 100.0 fL   MCH 28.0 26.0 - 34.0 pg   MCHC 31.6 30.0 - 36.0 g/dL   RDW 14.6 11.5 - 15.5 %   Platelets 255 150 - 400 K/uL   nRBC 0.0 0.0 - 0.2 %    Comment: Performed at [facility], [place] [address], [place], [place] [id number]  Basic metabolic panel     Status: Abnormal   Collection Time: 05/23/19  1:36 AM  Result Value Ref Range   Sodium 139 135 - 145 mmol/L   Potassium 4.5 3.5 - 5.1 mmol/L   Chloride 102 98 - 111 mmol/L   CO2 27 22 - 32 mmol/L   Glucose, Bld 109 (H) 70 - 99 mg/dL    Comment: Glucose reference range applies only to samples taken after fasting for at least 8 hours.   BUN 18 8 - 23 mg/dL   Creatinine, Ser 1.16 (H) 0.44 - 1.00 mg/dL   Calcium 8.7 (L) 8.9 - 10.3 mg/dL   GFR calc non Af Amer 49 (L) >60 mL/min   GFR calc Af Amer 56 (L) >60 mL/min   Anion gap 10 5 - 15    Comment: Performed at [facility] [address], [place], [place] [address]   No results found.  Medical Problem List and Plan: 1. Fractures to left inferior and superior pubic ramis  -patient may shower but incision must be covered.  -ELOS/Goals: modI in PT, OT, I in SLP for 5-7 days 2.  Antithrombotics: -DVT/anticoagulation:  Pharmaceutical: Lovenox  -antiplatelet therapy: Plavix 3. Pain Management: ON tylenol 1000 mg qid-->decrease due to age, baclofen 5 mg tid, gabapentin 600 mg bid/900 mg hs, oxycodone 10 mg qid, Oxycontin 10mg  BID, Ice prn  3/15: Pain is currently well controlled with this regimen. Reports 5/10 pain.  4. Mood: LCSW to follow for  evaluation and support  -antipsychotic agents: N/A 5. Neuropsych: This patient is capable of making decisions on her own behalf. 6. Skin/Wound Care: Routine pressure relief measures.  7. Fluids/Electrolytes/Nutrition: Monitor I/O. Check lytes in am.  8. HTN: Monitor BP tid--continue Lasix. Has been elevated. Add Amlodipine 2.5mg  daily.  9. H/o migraines: On Zoloft, Zonegran and inderal. 10. Leucocytosis: Monitor for signs of infection. 11. Acute renal failure/Hyponateremia: BUN/SCr 20/1.10 at admission-->35/1.68.Has trended up with addition of Toradol> will d/c.  12. Constipation: On Senna and miralax.   Bary Leriche, PA-C 05/20/2019   I have personally performed a face to face diagnostic evaluation, including, but not limited to relevant history and physical exam findings, of this patient and developed relevant assessment and plan.  Additionally, I have reviewed and concur with the physician assistant's documentation above.  The patient's status has not changed. The original post admission physician evaluation remains appropriate, and any changes from the pre-admission  screening or documentation from the acute chart are noted above.   Leeroy Cha, MD

## 2019-05-23 NOTE — Progress Notes (Signed)
Physical Therapy Treatment Patient Details Name: [name] MRN: [id number] DOB: Nov 13, 1950 Today's Date: 05/23/2019    History of Present Illness [name] is a 69 y.o. female presenting 05/14/19 with 2-week history of left hip pain from known multiple left pubic rami and left sacral fractures after fall.  (Of note, was seen by Dr Maureen Ralphs in his office after fall). Progressively became unable to ambulate at home due to incr pain, and presented to hospital. PMH is significant for HTN, migraine, H/O TIA (on anticoagulation), h/o pulmonary embolism, PVD, OA of knee, CKD stage III, anxiety    PT Comments    Pt with progressive gait but declined attempting stairs this session. Pt educated for HEP and progressive mobility. Pt reports still not having pain under control with mobility at all times and increased pain with increased activity but tolerating and aware of when pain medication can be received again. Pt reports she does not feel comfortable with return home yet due to pain and limited activity but happy with progression. Will continue to follow.     Follow Up Recommendations  CIR     Equipment Recommendations  None recommended by PT    Recommendations for Other Services       Precautions / Restrictions Precautions Precautions: Fall Restrictions LLE Weight Bearing: Touchdown weight bearing    Mobility  Bed Mobility   Bed Mobility: Supine to Sit     Supine to sit: HOB elevated;Supervision     General bed mobility comments: HOB 40 degrees with rail to pivot to left. Pt declined attempting with bed flat  Transfers Overall transfer level: Needs assistance   Transfers: Sit to/from Stand Sit to Stand: Supervision         General transfer comment: good hand placement and stabilization. cues for toe out with turning to sit to decrease pain  Ambulation/Gait Ambulation/Gait assistance: Min guard Gait Distance (Feet): 90 Feet Assistive device: Rolling walker  (2 wheeled) Gait Pattern/deviations: Step-to pattern   Gait velocity interpretation: 1.31 - 2.62 ft/sec, indicative of limited community ambulator General Gait Details: pt with maintained TDWB LLE throughout with fatigue limited by increasing pain with gait up to 9/10 in left hip with distance per pt and pt self directing distance   Stairs Stairs: (pt declined attempting)           Wheelchair Mobility    Modified Rankin (Stroke Patients Only)       Balance Overall balance assessment: Needs assistance   Sitting balance-Leahy Scale: Good       Standing balance-Leahy Scale: Fair                              Cognition Arousal/Alertness: Awake/alert Behavior During Therapy: WFL for tasks assessed/performed Overall Cognitive Status: Within Functional Limits for tasks assessed                                        Exercises General Exercises - Lower Extremity Long Arc Quad: AROM;Left;Seated;15 reps Hip ABduction/ADduction: AROM;Strengthening;Left;15 reps;Seated Hip Flexion/Marching: AROM;Left;Seated;15 reps    General Comments        Pertinent Vitals/Pain Pain Score: 5  Pain Location: L hip and groin Pain Descriptors / Indicators: Aching;Guarding Pain Intervention(s): Limited activity within patient's tolerance;Monitored during session;Repositioned    Home Living  Prior Function            PT Goals (current goals can now be found in the care plan section) Progress towards PT goals: Progressing toward goals    Frequency           PT Plan Current plan remains appropriate    Co-evaluation              AM-PAC PT "6 Clicks" Mobility   Outcome Measure  Help needed turning from your back to your side while in a flat bed without using bedrails?: A Little Help needed moving from lying on your back to sitting on the side of a flat bed without using bedrails?: A Little Help needed moving to  and from a bed to a chair (including a wheelchair)?: None Help needed standing up from a chair using your arms (e.g., wheelchair or bedside chair)?: None Help needed to walk in hospital room?: A Little Help needed climbing 3-5 steps with a railing? : A Little 6 Click Score: 20    End of Session   Activity Tolerance: Patient tolerated treatment well Patient left: in chair;with call bell/phone within reach;with family/visitor present;with chair alarm set Nurse Communication: Mobility status PT Visit Diagnosis: Difficulty in walking, not elsewhere classified (R26.2);Pain     Time: 0722-0737 PT Time Calculation (min) (ACUTE ONLY): 15 min  Charges:  $Gait Training: 8-22 mins                     Bayard Males, PT Acute Rehabilitation Services Pager: [phone] Office: [place] 05/23/2019, 7:44 AM

## 2019-05-23 NOTE — Progress Notes (Signed)
Occupational Therapy Treatment Patient Details Name: [name] MRN: [id number] DOB: 10/26/1950 Today's Date: 05/23/2019    History of present illness [name] is a 69 y.o. female presenting 05/14/19 with 2-week history of left hip pain from known multiple left pubic rami and left sacral fractures after fall.  (Of note, was seen by Dr Maureen Ralphs in his office after fall). Progressively became unable to ambulate at home due to incr pain, and presented to hospital. PMH is significant for HTN, migraine, H/O TIA (on anticoagulation), h/o pulmonary embolism, PVD, OA of knee, CKD stage III, anxiety   OT comments  Pt progressing toward established OT goals. Pt agreeable to OT session and highly motivated to engage in therapy. She required minguard for functional mobility at RW level. Pt required minA for LB dressing, began education on use of AE to assist with LB dressing. Pt required minguard with intermitent minA to reach outside base of support while standing to gather ADL items. Pt's currently level of functioning appropriate for d/c to CIR with continued OT services. All acute needs addressed at this time. OT will sign off. Thank you.    Follow Up Recommendations  CIR    Equipment Recommendations  None recommended by OT    Recommendations for Other Services      Precautions / Restrictions Precautions Precautions: Fall Restrictions Weight Bearing Restrictions: Yes LLE Weight Bearing: Touchdown weight bearing Other Position/Activity Restrictions: per Ortho       Mobility Bed Mobility               General bed mobility comments: pt sitting in recliner upon arrival  Transfers Overall transfer level: Needs assistance Equipment used: Rolling walker (2 wheeled) Transfers: Sit to/from Stand Sit to Stand: Min guard         General transfer comment: minguard for safety    Balance Overall balance assessment: Needs assistance Sitting-balance support: Feet  supported Sitting balance-Leahy Scale: Good Sitting balance - Comments: better midline posture and positioning today   Standing balance support: Single extremity supported;During functional activity Standing balance-Leahy Scale: Poor Standing balance comment: continues to rely on single UE support for dynamic balance and reaching outside base of support                           ADL either performed or assessed with clinical judgement   ADL Overall ADL's : Needs assistance/impaired     Grooming: Min guard;Standing   Upper Body Bathing: Set up;Sitting   Lower Body Bathing: Minimal assistance;Sit to/from stand       Lower Body Dressing: Minimal assistance;Sit to/from stand Lower Body Dressing Details (indicate cue type and reason): minA for access to feet Toilet Transfer: Min Marine scientist Details (indicate cue type and reason): simulated with in room mobiltiy Toileting- Clothing Manipulation and Hygiene: Min guard;Sit to/from stand       Functional mobility during ADLs: Min guard;Rolling walker General ADL Comments: pt engaged in more dynamic balance activities, reaching outside base of support for ADL items;minguard for stability and vc for safe hand placement when reaching outside BOS;began education on AE to assist with LB dressing      Vision       Perception     Praxis      Cognition Arousal/Alertness: Awake/alert Behavior During Therapy: WFL for tasks assessed/performed Overall Cognitive Status: Within Functional Limits for tasks assessed  Exercises     Shoulder Instructions       General Comments vss    Pertinent Vitals/ Pain       Pain Assessment: 0-10 Pain Score: 3  Pain Location: L hip and groin Pain Descriptors / Indicators: Aching;Guarding Pain Intervention(s): Limited activity within patient's tolerance;Monitored during session  Home Living                                           Prior Functioning/Environment              Frequency  Min 2X/week        Progress Toward Goals  OT Goals(current goals can now be found in the care plan section)  Progress towards OT goals: Progressing toward goals  Acute Rehab OT Goals Patient Stated Goal: go to CIR today OT Goal Formulation: With patient Time For Goal Achievement: 05/30/19 Potential to Achieve Goals: Good ADL Goals Pt Will Perform Grooming: with modified independence;standing Pt Will Perform Lower Body Dressing: with modified independence;sit to/from stand;with adaptive equipment Pt Will Transfer to Toilet: with modified independence;ambulating Pt Will Perform Tub/Shower Transfer: Tub transfer;with modified independence;ambulating;shower seat  Plan Discharge plan remains appropriate;Other (comment)(all additional OT needs to be addressed at next venue)    Co-evaluation                 AM-PAC OT "6 Clicks" Daily Activity     Outcome Measure   Help from another person eating meals?: None Help from another person taking care of personal grooming?: A Little Help from another person toileting, which includes using toliet, bedpan, or urinal?: A Little Help from another person bathing (including washing, rinsing, drying)?: A Little Help from another person to put on and taking off regular upper body clothing?: A Little Help from another person to put on and taking off regular lower body clothing?: A Little 6 Click Score: 19    End of Session Equipment Utilized During Treatment: Rolling walker  OT Visit Diagnosis: Unsteadiness on feet (R26.81);Other abnormalities of gait and mobility (R26.89);Muscle weakness (generalized) (M62.81);History of falling (Z91.81);Pain Pain - Right/Left: Left Pain - part of body: Hip   Activity Tolerance Patient tolerated treatment well   Patient Left in chair;with call bell/phone within reach;with chair alarm set    Nurse Communication Mobility status        Time: [id number] OT Time Calculation (min): 17 min  Charges: OT General Charges $OT Visit: 1 Visit OT Treatments $Self Care/Home Management : 8-22 mins  Helene Kelp OTR/L Acute Rehabilitation Services Office: [phone]    [name] 05/23/2019, 1:15 PM

## 2019-05-23 NOTE — Plan of Care (Signed)
  Problem: Education: Goal: Verbalization of understanding the information provided (i.e., activity precautions, restrictions, etc) will improve Outcome: Progressing Goal: Individualized Educational Video(s) Outcome: Progressing   Problem: Activity: Goal: Ability to ambulate and perform ADLs will improve Description: Patient will be able to ambulate with mod I assist Outcome: Progressing   Problem: Clinical Measurements: Goal: Postoperative complications will be avoided or minimized Outcome: Progressing   Problem: Self-Concept: Goal: Ability to maintain and perform role responsibilities to the fullest extent possible will improve Description: Patient will be able to perform with mod I assist  Outcome: Progressing   Problem: Pain Management: Goal: Pain level will decrease Outcome: Progressing

## 2019-05-24 ENCOUNTER — Inpatient Hospital Stay (HOSPITAL_COMMUNITY): Payer: PRIVATE HEALTH INSURANCE | Admitting: Occupational Therapy

## 2019-05-24 ENCOUNTER — Inpatient Hospital Stay (HOSPITAL_COMMUNITY): Payer: PRIVATE HEALTH INSURANCE | Admitting: Physical Therapy

## 2019-05-24 DIAGNOSIS — K5901 Slow transit constipation: Secondary | ICD-10-CM

## 2019-05-24 DIAGNOSIS — R7989 Other specified abnormal findings of blood chemistry: Secondary | ICD-10-CM

## 2019-05-24 DIAGNOSIS — S3282XS Multiple fractures of pelvis without disruption of pelvic ring, sequela: Secondary | ICD-10-CM

## 2019-05-24 LAB — CBC WITH DIFFERENTIAL/PLATELET
Abs Immature Granulocytes: 0.3 10*3/uL — ABNORMAL HIGH (ref 0.00–0.07)
Basophils Absolute: 0.1 10*3/uL (ref 0.0–0.1)
Basophils Relative: 1 %
Eosinophils Absolute: 0.2 10*3/uL (ref 0.0–0.5)
Eosinophils Relative: 2 %
HCT: 35.6 % — ABNORMAL LOW (ref 36.0–46.0)
Hemoglobin: 11.2 g/dL — ABNORMAL LOW (ref 12.0–15.0)
Immature Granulocytes: 4 %
Lymphocytes Relative: 20 %
Lymphs Abs: 1.6 10*3/uL (ref 0.7–4.0)
MCH: 27.9 pg (ref 26.0–34.0)
MCHC: 31.5 g/dL (ref 30.0–36.0)
MCV: 88.6 fL (ref 80.0–100.0)
Monocytes Absolute: 0.5 10*3/uL (ref 0.1–1.0)
Monocytes Relative: 6 %
Neutro Abs: 5.6 10*3/uL (ref 1.7–7.7)
Neutrophils Relative %: 67 %
Platelets: 250 10*3/uL (ref 150–400)
RBC: 4.02 MIL/uL (ref 3.87–5.11)
RDW: 14.6 % (ref 11.5–15.5)
WBC: 8.3 10*3/uL (ref 4.0–10.5)
nRBC: 0 % (ref 0.0–0.2)

## 2019-05-24 LAB — COMPREHENSIVE METABOLIC PANEL
ALT: 23 U/L (ref 0–44)
AST: 22 U/L (ref 15–41)
Albumin: 3.1 g/dL — ABNORMAL LOW (ref 3.5–5.0)
Alkaline Phosphatase: 244 U/L — ABNORMAL HIGH (ref 38–126)
Anion gap: 11 (ref 5–15)
BUN: 19 mg/dL (ref 8–23)
CO2: 29 mmol/L (ref 22–32)
Calcium: 8.7 mg/dL — ABNORMAL LOW (ref 8.9–10.3)
Chloride: 101 mmol/L (ref 98–111)
Creatinine, Ser: 1.06 mg/dL — ABNORMAL HIGH (ref 0.44–1.00)
GFR calc Af Amer: >60 mL/min (ref >60)
GFR calc non Af Amer: 54 mL/min — ABNORMAL LOW (ref >60)
Glucose, Bld: 115 mg/dL — ABNORMAL HIGH (ref 70–99)
Potassium: 4.5 mmol/L (ref 3.5–5.1)
Sodium: 141 mmol/L (ref 135–145)
Total Bilirubin: 0.6 mg/dL (ref 0.3–1.2)
Total Protein: 6 g/dL — ABNORMAL LOW (ref 6.5–8.1)

## 2019-05-24 MED ORDER — LORATADINE 10 MG PO TABS
10.0000 mg | ORAL_TABLET | Freq: Every day | ORAL | Status: DC
  Administered 2019-05-24 – 2019-05-29 (×6): 10 mg via ORAL
  Filled 2019-05-24 (×6): qty 1

## 2019-05-24 MED ORDER — FLUTICASONE PROPIONATE 50 MCG/ACT NA SUSP
1.0000 | Freq: Every day | NASAL | Status: DC
  Administered 2019-05-24 – 2019-05-29 (×6): 1 via NASAL
  Filled 2019-05-24: qty 16

## 2019-05-24 MED ORDER — ALBUTEROL SULFATE (2.5 MG/3ML) 0.083% IN NEBU: 2.5000 mg | INHALATION_SOLUTION | Freq: Two times a day (BID) | RESPIRATORY_TRACT | Status: DC | PRN

## 2019-05-24 NOTE — Patient Care Conference (Signed)
Inpatient RehabilitationTeam Conference and Plan of Care Update Date: 05/24/2019   Time: 10:40 AM    Patient Name: [name]      Medical Record Number: [id number]  Date of Birth: 15-Jul-1950 Sex: Female         Room/Bed: 4W09C/4W09C-01 Payor Info: Payor: MEDICARE / Plan: MEDICARE PART A AND B / Product Type: *No Product type* /    Admit Date/Time:  05/23/2019  4:10 PM  Primary Diagnosis:  Multiple pelvic fractures [facility])  Patient Active Problem List   Diagnosis Date Noted  . Multiple pelvic fractures ([place]) 05/23/2019  . Muscular deconditioning   . Frequent falls   . Nondisplaced fracture of anterior wall of left acetabulum   . Fracture of multiple pubic rami, left 05/14/2019  . Chronic cough 01/05/2019  . Fatigue 11/04/2018  . Exposure to COVID-19 virus 10/05/2018  . Pre-operative clearance 04/21/2018  . PVD (peripheral vascular disease) ([place]) 04/20/2018  . CRI (chronic renal insufficiency), stage 3 (moderate) 04/20/2018  . Pyelonephritis 04/05/2018  . Complicated UTI (urinary tract infection) 04/04/2018  . Plantar fasciitis 01/10/2018  . Chronic anxiety 01/08/2018  . Gastroesophageal reflux disease 01/08/2018  . Menopausal syndrome 01/08/2018  . Pulmonary embolism ([place])   . Coronary artery calcification   . Dyspnea 07/26/2017  . Osteoarthritis of knee 04/08/2017  . Status post cholecystectomy   . Hyperlipidemia LDL goal <70 04/02/2015  . Chronic migraine without aura, with status migrainosus 09/26/2014  . Migraine variant 10/10/2013  . TIA (transient ischemic attack) 10/10/2013  . Essential hypertension 07/18/2013  . 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 Neuralgia, neuritis, and radiculitis, unspecified 02/04/2012  . Myofascial muscle pain 07/15/2011    Expected Discharge Date: Expected Discharge Date: 05/29/19  Team Members Present: Physician leading conference: Dr. Alger Simons Care Coodinator Present: [name], LCSWA;Genie Marcelline Temkin, RN,  MSN Nurse Present: Debroah Loop, RN PT Present: Lavone Nian, PT OT Present: Cherylynn Ridges, OT SLP Present: Weston Anna, SLP PPS Coordinator present : Ileana Ladd, Burna Mortimer, SLP     Current Status/Progress Goal Weekly Team Focus  Bowel/Bladder   cont of B&B LMB-3/15  remain continent of B&B , keep regular schedule of Bowel  assses qshift for tolieting and prn   Swallow/Nutrition/ Hydration             ADL's   SUpervioson/CGA BADL tasks  Mod I      Mobility   supervision bed mobility, CGA/close supervision transfers & gait with RW  mod I overall except supervision stairs  balance, stairs, gait, strengthening, d/c planning, pt education   Communication             Safety/Cognition/ Behavioral Observations            Pain   pt has mutiple medication for pain , pt has volteran gel for left hip  keep pain level less than 3  assess pain qhsift and prn   Skin   pt has no major skin issue beside incision  remain free of breakdown on skin  assess skin of qshift and prn    Rehab Goals Patient on target to meet rehab goals: Yes *See Care Plan and progress notes for long and short-term goals.     Barriers to Discharge  Current Status/Progress Possible Resolutions Date Resolved   Nursing                  PT  OT                  SLP                SW                Discharge Planning/Teaching Needs:  Pt to discharge to home with her husband and will have support from her dtr in law  Family education as recommended by therapy   Team Discussion: MD Pelvic fxs after a fall, did not do well at home, pain, small sciatic nerve injury, numbness L big toe.  RN oxy for pain, BM 3/13.  OT S/CGA ADLs, pain controlled, CGA amb to bathroom, mod I goals.  PT CGA/close S overall, mod I goals.   Revisions to Treatment Plan: N/A     Medical Summary Current Status: pelvic fx's after fall. pain uncontrolled previously--improved now. TDWB LLE. moving  bowels. left first toe numbness Weekly Focus/Goal: improve pain control, activity tolerance  Barriers to Discharge: Other (comments)  Barriers to Discharge Comments: pain Possible Resolutions to Barriers: rx pain   Continued Need for Acute Rehabilitation Level of Care: The patient requires daily medical management by a physician with specialized training in physical medicine and rehabilitation for the following reasons: Direction of a multidisciplinary physical rehabilitation program to maximize functional independence : Yes Medical management of patient stability for increased activity during participation in an intensive rehabilitation regime.: Yes Analysis of laboratory values and/or radiology reports with any subsequent need for medication adjustment and/or medical intervention. : Yes   I attest that I was present, lead the team conference, and concur with the assessment and plan of the team.   Retta Diones 05/25/2019, 3:34 PM   Team conference was held via web/ teleconference due to [place] - 19

## 2019-05-24 NOTE — Plan of Care (Signed)
  Problem: Education: Goal: Verbalization of understanding the information provided (i.e., activity precautions, restrictions, etc) will improve Outcome: Progressing Goal: Individualized Educational Video(s) Outcome: Progressing   Problem: Activity: Goal: Ability to ambulate and perform ADLs will improve Description: Patient will be able to ambulate with mod I assist Outcome: Progressing   Problem: Clinical Measurements: Goal: Postoperative complications will be avoided or minimized Outcome: Progressing   Problem: Self-Concept: Goal: Ability to maintain and perform role responsibilities to the fullest extent possible will improve Description: Patient will be able to perform with mod I assist  Outcome: Progressing   Problem: Pain Management: Goal: Pain level will decrease Outcome: Progressing   Problem: Consults Goal: RH GENERAL PATIENT EDUCATION Description: See Patient Education module for education specifics. Outcome: Progressing Goal: Skin Care Protocol Initiated - if Braden Score 18 or less Description: If consults are not indicated, leave blank or document N/A Outcome: Progressing Goal: Nutrition Consult-if indicated Outcome: Progressing Goal: Diabetes Guidelines if Diabetic/Glucose > 140 Description: If diabetic or lab glucose is > 140 mg/dl - Initiate Diabetes/Hyperglycemia Guidelines & Document Interventions  Outcome: Progressing   Problem: RH BOWEL ELIMINATION Goal: RH STG MANAGE BOWEL WITH ASSISTANCE Description: STG Manage Bowel with Assistance. Outcome: Progressing Goal: RH STG MANAGE BOWEL W/MEDICATION W/ASSISTANCE Description: STG Manage Bowel with Medication with Assistance. Outcome: Progressing   Problem: RH BLADDER ELIMINATION Goal: RH STG MANAGE BLADDER WITH ASSISTANCE Description: STG Manage Bladder With Assistance Outcome: Progressing Goal: RH STG MANAGE BLADDER WITH MEDICATION WITH ASSISTANCE Description: STG Manage Bladder With Medication With  Assistance. Outcome: Progressing Goal: RH STG MANAGE BLADDER WITH EQUIPMENT WITH ASSISTANCE Description: STG Manage Bladder With Equipment With Assistance Outcome: Progressing   Problem: RH SKIN INTEGRITY Goal: RH STG SKIN FREE OF INFECTION/BREAKDOWN Outcome: Progressing Goal: RH STG MAINTAIN SKIN INTEGRITY WITH ASSISTANCE Description: STG Maintain Skin Integrity With Assistance. Outcome: Progressing Goal: RH STG ABLE TO PERFORM INCISION/WOUND CARE W/ASSISTANCE Description: STG Able To Perform Incision/Wound Care With Assistance. Outcome: Progressing   Problem: RH SAFETY Goal: RH STG ADHERE TO SAFETY PRECAUTIONS W/ASSISTANCE/DEVICE Description: STG Adhere to Safety Precautions With Assistance/Device. Outcome: Progressing Goal: RH STG DECREASED RISK OF FALL WITH ASSISTANCE Description: STG Decreased Risk of Fall With Assistance. Outcome: Progressing   Problem: RH KNOWLEDGE DEFICIT GENERAL Goal: RH STG INCREASE KNOWLEDGE OF SELF CARE AFTER HOSPITALIZATION Outcome: Progressing   Problem: RH PAIN MANAGEMENT Goal: RH STG PAIN MANAGED AT OR BELOW PT'S PAIN GOAL Outcome: Progressing   Problem: RH Vision Goal: RH LTG Vision (Specify) Outcome: Progressing   Problem: RH Pre-functional/Other (Specify) Goal: RH LTG Pre-functional (Specify) Outcome: Progressing Goal: RH LTG Interdisciplinary (Specify) 1 Description: RH LTG Interdisciplinary (Specify)1 Outcome: Progressing Goal: RH LTG Interdisciplinary (Specify) 2 Description: RH LTG Interdisciplinary (Specify) 2  Outcome: Progressing

## 2019-05-24 NOTE — Progress Notes (Signed)
Inpatient Rehabilitation  Patient information reviewed and entered into eRehab system by Enas Winchel M. Cynthya Yam, M.A., CCC/SLP, PPS Coordinator.  Information including medical coding, functional ability and quality indicators will be reviewed and updated through discharge.    

## 2019-05-24 NOTE — Evaluation (Signed)
Physical Therapy Assessment and Plan  Patient Details  Name: [name] MRN: [id number] Date of Birth: 08-16-1950  PT Diagnosis: Abnormality of gait, Difficulty walking, Muscle weakness and Pain in LLE Rehab Potential: Excellent ELOS: 4-6 days   Today's Date: 05/24/2019 PT Individual Time: 0807-0901 PT Individual Time Calculation (min): 54 min    Problem List:  Patient Active Problem List   Diagnosis Date Noted  . Multiple pelvic fractures ([place]) 05/23/2019  . Muscular deconditioning   . Frequent falls   . Nondisplaced fracture of anterior wall of left acetabulum   . Fracture of multiple pubic rami, left 05/14/2019  . Chronic cough 01/05/2019  . Fatigue 11/04/2018  . Exposure to COVID-19 virus 10/05/2018  . Pre-operative clearance 04/21/2018  . PVD (peripheral vascular disease) ([place]) 04/20/2018  . CRI (chronic renal insufficiency), stage 3 (moderate) 04/20/2018  . Pyelonephritis 04/05/2018  . Complicated UTI (urinary tract infection) 04/04/2018  . Plantar fasciitis 01/10/2018  . Chronic anxiety 01/08/2018  . Gastroesophageal reflux disease 01/08/2018  . Menopausal syndrome 01/08/2018  . Pulmonary embolism ([place])   . Coronary artery calcification   . Dyspnea 07/26/2017  . Osteoarthritis of knee 04/08/2017  . Status post cholecystectomy   . Hyperlipidemia LDL goal <70 04/02/2015  . Chronic migraine without aura, with status migrainosus 09/26/2014  . Migraine variant 10/10/2013  . TIA (transient ischemic attack) 10/10/2013  . Essential hypertension 07/18/2013  . 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 Neuralgia, neuritis, and radiculitis, unspecified 02/04/2012  . Myofascial muscle pain 07/15/2011    Past Medical History:  Past Medical History:  Diagnosis Date  . Anemia   . Cervical neuritis   . Chronic kidney disease    stage 3  . Dyslipidemia   . GERD (gastroesophageal reflux disease)   . History of transient ischemic attack (TIA)   . HTN  (hypertension)   . Hyperlipidemia   . Migraine   . Migraine headache   . Occipital neuralgia    Left  . Pulmonary embolism ([place]) 2017  . Renal insufficiency   . Stroke [facility]) 10/2013   Past Surgical History:  Past Surgical History:  Procedure Laterality Date  . ABDOMINAL HYSTERECTOMY    . BILIARY STENT PLACEMENT N/A 12/12/2015   Procedure: BILIARY STENT PLACEMENT;  Surgeon: Doran Stabler, MD;  Location: [facility] ENDOSCOPY;  Service: Endoscopy;  Laterality: N/A;  . BLADDER REPAIR    . CARPAL TUNNEL RELEASE    . CERVICAL FUSION    . CERVICAL SPINE SURGERY    . CHOLECYSTECTOMY N/A 12/06/2015   Procedure: LAPAROSCOPIC CHOLECYSTECTOMY WITH  INTRAOPERATIVE CHOLANGIOGRAM;  Surgeon: Stark Klein, MD;  Location: [facility];  Service: General;  Laterality: N/A;  . ELBOW SURGERY    . ERCP N/A 12/12/2015   Procedure: ENDOSCOPIC RETROGRADE CHOLANGIOPANCREATOGRAPHY (ERCP);  Surgeon: Doran Stabler, MD;  Location: [facility] ENDOSCOPY;  Service: Endoscopy;  Laterality: N/A;  . ESOPHAGOGASTRODUODENOSCOPY N/A 01/28/2016   Procedure: ESOPHAGOGASTRODUODENOSCOPY (EGD) Biliary STENT removal;  Surgeon: Doran Stabler, MD;  Location: [facility] ENDOSCOPY;  Service: Gastroenterology;  Laterality: N/A;  . IR GENERIC HISTORICAL  12/17/2015   IR US GUIDE BX ASP/DRAIN 12/17/2015 [facility]INTER[facility]  . IR GENERIC HISTORICAL  12/17/2015   IR GUIDED DRAIN W CATHETER PLACEMENT 12/17/2015 [facility]  . IR GENERIC HISTORICAL  12/17/2015   IR SINUS/FIST TUBE CHK-NON GI 12/17/2015 [facility]  . KNEE SURGERY    . LUNG SURGERY    . TEE WITHOUT CARDIOVERSION N/A 12/19/2015   Procedure: TRANSESOPHAGEAL  ECHOCARDIOGRAM (TEE);  Surgeon: Josue Hector, MD;  Location: [facility] ENDOSCOPY;  Service: Cardiovascular;  Laterality: N/A;    Assessment & Plan Clinical Impression: Patient is a 69 y.o. year old with history of normocytic anemia, TIA's, HTN< cervical neuritis, PE, migraines, fall 2 weeks PTA with subsequent pelvic fractures. She was admitted  05/14/19 for pain control, inability to ambulate and difficulty being managed at home. CT head was negative for acute changes and CT spine showed solid fusion C4/5 C5/6 and C6/7.  She continued to have issues with pain control and mobility. CT lumbar and pelvic spine done for work up and showed chronic compression Fx L1 with <50% deformity and minimally displaced fracture left pubis, left acetabular and left sacrum with extension into left SI joint.  Ortho consulted for input and recommended TTWB LLE. Gabapentin added and titrated upwards. IV dilaudid weaned to oxycodone by 3/12 and sheToradol IV added 3/11. .  Patient transferred to CIR on 05/23/2019 .   Patient currently requires CGA with mobility secondary to muscle weakness, and decreased standing balance, decreased balance strategies and difficulty maintaining precautions.  Prior to hospitalization, patient was independent  with mobility and lived with Spouse in a House home.  Home access is 2Stairs to enter.  Patient will benefit from skilled PT intervention to maximize safe functional mobility, minimize fall risk and decrease caregiver burden for planned discharge home with intermittent assist.  Anticipate patient will benefit from follow up OP at discharge.  PT - End of Session Activity Tolerance: Tolerates 30+ min activity with multiple rests Endurance Deficit: Yes Endurance Deficit Description: generalized deconditioning 2/2 inability to use LLE fully during mobility PT Assessment Rehab Potential (ACUTE/IP ONLY): Excellent PT Patient demonstrates impairments in the following area(s): Balance;Sensory;Endurance;Motor;Pain PT Transfers Functional Problem(s): Bed Mobility;Car;Bed to Chair;Furniture PT Locomotion Functional Problem(s): Stairs;Ambulation;Wheelchair Mobility PT Plan PT Intensity: Minimum of 1-2 x/day ,45 to 90 minutes PT Frequency: 5 out of 7 days PT Duration Estimated Length of Stay: 4-6 days PT Treatment/Interventions:  Ambulation/gait training;Community reintegration;DME/adaptive equipment instruction;Psychosocial support;Stair training;UE/LE Strength taining/ROM;Wheelchair propulsion/positioning;UE/LE Coordination activities;Therapeutic Activities;Skin care/wound management;Pain management;Discharge planning;Balance/vestibular training;Cognitive remediation/compensation;Disease management/prevention;Functional mobility training;Patient/family education;Therapeutic Exercise PT Transfers Anticipated Outcome(s): mod I with LRAD PT Locomotion Anticipated Outcome(s): mod I with LRAD except supervision for stair negotiation PT Recommendation Recommendations for Other Services: Therapeutic Recreation consult Therapeutic Recreation Interventions: Kitchen group;Outing/community reintergration Follow Up Recommendations: Outpatient PT Patient destination: Home Equipment Recommended: Rolling walker with 5" wheels  Skilled Therapeutic Intervention Patient received in w/c & agreeable to tx. Educated pt on daily therapy schedule, weekly team meetings, ELOS, and other CIR information. Pt provides PLOF information. Pt completes functional mobility as noted below. Sit<>stand transfers with CGA with cuing for LLE placement to maintain TDWB LLE, gait >150 ft with RW & CGA, w/c mobility with BUE & supervision, car transfer with CGA, and gait over ramp & mulch with RW & supervision. Pt completes bed mobility in apartment with supervision. Pt requires use of UE to assist LLE in/out of car and up onto elevated bed. Pt would benefit from use of weight bearing shoe to ensure she is maintaining TDWB LLE during transfers/gait - plan to trial this tomorrow, in addition to stair negotiation. At end of session pt left in recliner with chair alarm donned & all needs in reach.  Provided pt with w/c cushion to prevent skin breakdown & promote OOB tolerance.  PT Evaluation Precautions/Restrictions Precautions Precautions:  Fall Restrictions Weight Bearing Restrictions: Yes LLE Weight Bearing: Touchdown weight bearing  General Chart Reviewed: Yes Additional Pertinent History: TIA, HTN, HLD, cervical neuritis, PE, migraines, CKD stage 3 Response to Previous Treatment: Not applicable Family/Caregiver Present: No   Pain 5/10 LLE - pt reports she's premedicated, rest breaks provided PRN.  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 No changes in baseline vision. Perception Perception: Within Functional Limits Praxis Praxis: Intact   Cognition Overall Cognitive Status: Within Functional Limits for tasks assessed Arousal/Alertness: Awake/alert Orientation Level: Oriented X4 Memory: Appears intact Awareness: Appears intact Problem Solving: Appears intact Safety/Judgment: Appears intact  Sensation Sensation Light Touch: Appears Intact(intact BLE upon testing; pt reports numbness in great toe on LLE but reports this has been ongoing for ~1 week) Proprioception: Appears Intact(BLE) Coordination Gross Motor Movements are Fluid and Coordinated: Yes Fine Motor Movements are Fluid and Coordinated: Yes Heel Shin Test:  coordination intact BLE but decreased speed of movement LLE 2/2 pain  Motor  Motor Motor: Abnormal postural alignment and control Motor - Skilled Clinical Observations: LLE weakness   Mobility Bed Mobility Bed Mobility: Rolling Right;Supine to Sit;Sit to Supine Rolling Right: Supervision/verbal cueing Supine to Sit: Supervision/Verbal cueing Sit to Supine: Supervision/Verbal cueing Transfers Transfers: Sit to Stand;Stand to Sit(cuing for LLE to maintain TDWB LLE) Sit to Stand: Contact Guard/Touching assist Stand to Sit: Contact Guard/Touching assist Transfer (Assistive device): Rolling walker  Locomotion  Gait Ambulation: Yes Gait Assistance: Contact Guard/Touching assist Gait Distance (Feet): (>150 ft) Assistive device: Rolling walker Gait Gait: Yes Gait Pattern: (cuing for technique for TDWB LLE) Gait Pattern: Decreased stride length;Decreased step length - left;Decreased stance time - left Gait velocity: decreased Stairs / Additional Locomotion Ramp: Contact Guard/touching assist(ambulatory with RW) Product manager Mobility: Yes Wheelchair Assistance: Chartered loss adjuster: Both upper extremities Wheelchair Parts Management: Needs assistance Distance: 150 ft   Trunk/Postural Assessment  Postural Control Postural Control: Deficits on evaluation Protective Responses: delayed 2/2 TDWB LLE   Balance Balance Balance Assessed: Yes Dynamic Standing Balance Dynamic Standing - Balance Support: Bilateral upper extremity supported;During functional activity Dynamic Standing - Level of Assistance: (CGA during gait with RW)  Extremity Assessment  RUE Assessment RUE Assessment: Within Functional Limits LUE Assessment LUE Assessment: Within Functional Limits   RLE Assessment RLE Assessment: Within Functional Limits LLE Assessment Passive Range of Motion (PROM) Comments: WFL General Strength Comments: no overpressure applied 2/2  pain with movement, 3/5 hip flexion & knee extension assessed in sitting    Refer to Care Plan for Long Term Goals  Recommendations for other services: Therapeutic Recreation  Kitchen group and Outing/community reintegration  Discharge Criteria: Patient will be discharged from PT if patient refuses treatment 3 consecutive times without medical reason, if treatment goals not met, if there is a change in medical status, if patient makes no progress towards goals or if patient is discharged  from hospital.  The above assessment, treatment plan, treatment alternatives and goals were discussed and mutually agreed upon: by patient  Waunita Schooner 05/24/2019, 10:54 AM

## 2019-05-24 NOTE — Evaluation (Signed)
Occupational Therapy Assessment and Plan  Patient Details  Name: [name] MRN: [id number] Date of Birth: November 05, 1950  OT Diagnosis: acute pain and muscle weakness (generalized) Rehab Potential: Rehab Potential (ACUTE ONLY): Excellent ELOS: 5-7 days   Today's Date: 05/24/2019  Session 1 OT Individual Time: 5597-4163 OT Individual Time Calculation (min): 57 min     Session 2 OT Individual Time: 8453-6468 OT Individual Time Calculation (min): 73 min     Problem List:  Patient Active Problem List   Diagnosis Date Noted  . Multiple pelvic fractures ([place]) 05/23/2019  . Muscular deconditioning   . Frequent falls   . Nondisplaced fracture of anterior wall of left acetabulum   . Fracture of multiple pubic rami, left 05/14/2019  . Chronic cough 01/05/2019  . Fatigue 11/04/2018  . Exposure to COVID-19 virus 10/05/2018  . Pre-operative clearance 04/21/2018  . PVD (peripheral vascular disease) ([place]) 04/20/2018  . CRI (chronic renal insufficiency), stage 3 (moderate) 04/20/2018  . Pyelonephritis 04/05/2018  . Complicated UTI (urinary tract infection) 04/04/2018  . Plantar fasciitis 01/10/2018  . Chronic anxiety 01/08/2018  . Gastroesophageal reflux disease 01/08/2018  . Menopausal syndrome 01/08/2018  . Pulmonary embolism ([place])   . Coronary artery calcification   . Dyspnea 07/26/2017  . Osteoarthritis of knee 04/08/2017  . Status post cholecystectomy   . Hyperlipidemia LDL goal <70 04/02/2015  . Chronic migraine without aura, with status migrainosus 09/26/2014  . Migraine variant 10/10/2013  . TIA (transient ischemic attack) 10/10/2013  . Essential hypertension 07/18/2013  . 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 Neuralgia, neuritis, and radiculitis, unspecified 02/04/2012  . Myofascial muscle pain 07/15/2011    Past Medical History:  Past Medical History:  Diagnosis Date  . Anemia   . Cervical neuritis   . Chronic kidney disease    stage 3  . Dyslipidemia    . GERD (gastroesophageal reflux disease)   . History of transient ischemic attack (TIA)   . HTN (hypertension)   . Hyperlipidemia   . Migraine   . Migraine headache   . Occipital neuralgia    Left  . Pulmonary embolism ([place]) 2017  . Renal insufficiency   . Stroke [facility]) 10/2013   Past Surgical History:  Past Surgical History:  Procedure Laterality Date  . ABDOMINAL HYSTERECTOMY    . BILIARY STENT PLACEMENT N/A 12/12/2015   Procedure: BILIARY STENT PLACEMENT;  Surgeon: Doran Stabler, MD;  Location: [facility] ENDOSCOPY;  Service: Endoscopy;  Laterality: N/A;  . BLADDER REPAIR    . CARPAL TUNNEL RELEASE    . CERVICAL FUSION    . CERVICAL SPINE SURGERY    . CHOLECYSTECTOMY N/A 12/06/2015   Procedure: LAPAROSCOPIC CHOLECYSTECTOMY WITH  INTRAOPERATIVE CHOLANGIOGRAM;  Surgeon: Stark Klein, MD;  Location: [facility];  Service: General;  Laterality: N/A;  . ELBOW SURGERY    . ERCP N/A 12/12/2015   Procedure: ENDOSCOPIC RETROGRADE CHOLANGIOPANCREATOGRAPHY (ERCP);  Surgeon: Doran Stabler, MD;  Location: [facility] ENDOSCOPY;  Service: Endoscopy;  Laterality: N/A;  . ESOPHAGOGASTRODUODENOSCOPY N/A 01/28/2016   Procedure: ESOPHAGOGASTRODUODENOSCOPY (EGD) Biliary STENT removal;  Surgeon: Doran Stabler, MD;  Location: [facility] ENDOSCOPY;  Service: Gastroenterology;  Laterality: N/A;  . IR GENERIC HISTORICAL  12/17/2015   IR US GUIDE BX ASP/DRAIN 12/17/2015 [facility]INTER[facility]  . IR GENERIC HISTORICAL  12/17/2015   IR GUIDED DRAIN W CATHETER PLACEMENT 12/17/2015 [facility]  . IR GENERIC HISTORICAL  12/17/2015   IR SINUS/FIST TUBE CHK-NON GI 12/17/2015 [facility]  . KNEE  SURGERY    . LUNG SURGERY    . TEE WITHOUT CARDIOVERSION N/A 12/19/2015   Procedure: TRANSESOPHAGEAL ECHOCARDIOGRAM (TEE);  Surgeon: Josue Hector, MD;  Location: [facility] ENDOSCOPY;  Service: Cardiovascular;  Laterality: N/A;    Assessment & Plan Clinical Impression: Patient is a 69 y.o. year old female with recent admission to the hospital on  05/14/19 for pain control, inability to ambulate and difficulty being managed at home. CT head was negative for acute changes and CT spine showed solid fusion C4/5 C5/6 and C6/7. She continued to have issues with pain control and mobility. CT lumbar and pelvic spine done for work up and showed chronic compression Fx L1 with <50% deformity and minimally displaced fracture left pubis, left acetabular and left sacrum with extension into left SI joint. Ortho consulted for input and recommended TTWB LLE. Gabapentin added and titrated upwards. IV dilaudid weaned to oxycodone by 3/12 and sheToradol IV added 3/11. Patient transferred to CIR on 05/23/2019 .    Patient currently requires min with basic self-care skills secondary to muscle weakness and decreased sitting balance, decreased standing balance, decreased balance strategies and difficulty maintaining precautions.  Prior to hospitalization, patient could complete BADL with independent .  Patient will benefit from skilled intervention to increase independence with basic self-care skills prior to discharge home with care partner.  Anticipate patient will require  intermittent supervision  and follow up home health. OT - End of Session Endurance Deficit: Yes Endurance Deficit Description: generalized deconditioning 2/2 inability to use LLE fully during mobility OT Assessment Rehab Potential (ACUTE ONLY): Excellent OT Patient demonstrates impairments in the following area(s): Balance;Endurance;Motor;Pain OT Basic ADL's Functional Problem(s): Bathing;Dressing;Toileting OT Transfers Functional Problem(s): Toilet;Tub/Shower OT Additional Impairment(s): None OT Plan OT Intensity: Minimum of 1-2 x/day, 45 to 90 minutes OT Frequency: 5 out of 7 days OT Duration/Estimated Length of Stay: 5-7 days OT Treatment/Interventions: Balance/vestibular training;Cognitive remediation/compensation;Community reintegration;Discharge planning;DME/adaptive equipment  instruction;Functional mobility training;Patient/family education;Self Care/advanced ADL retraining;Therapeutic Activities;Therapeutic Exercise;UE/LE Strength taining/ROM;UE/LE Coordination activities OT Basic Self-Care Anticipated Outcome(s): Mod I OT Toileting Anticipated Outcome(s): Mod I OT Bathroom Transfers Anticipated Outcome(s): Mod I OT Recommendation Patient destination: Home Follow Up Recommendations: Home health OT Equipment Recommended: Tub/shower bench;3 in 1 bedside comode  Skilled Therapeutic Intervention Session 1 Pt greeted seated in recliner with need to go to the bathroom. Pt needed CGA to stand with RW and ambulate to the bathroom. Min A for balance when stepping up incline to get into bathroom. Pt sat on commode and voided bowel and bladder. Pt able to complete peri-care with set-up A. Pt doffed pants seated on commode with min A. Pt then took a few steps onto shower seat. CGA for bathing tasks with lateral leans to wash buttocks. Stand-pivot out of shower with CGA. Pt brought to the dresser in wc and obtained clothing. Min A for LB dressing and CGA sit<>stand to pull up pants. Pt pivoted back to recliner with rw and CGA. OT addressed rehab process, OT purpose, POC, ELOS, and goals. PT left with LEs elevated, chair alarm on, and needs met.  Session 2 Pt greeted sitting in recliner and agreeable to OT treatment session. Pt declined to go to the bathroom at this time. Pt donned shoes with set-up A. Pt ambulated to wc with RW, CGA and verbal cues to maintain TDWB LLE. Pt brought down to tub room and educated on tub shower transfers. Pt demonstrated understanding with CGA for transfer. Pt completed 5 mins x 2 on Sci[employer] arm  bike for UB strength/endurance. LB strengthening with 3 sets of 10 seated hip extension, knee extension, hip abduction, and hip adduction with pillow. Pt completed obstacle course around cones focus on balance when turning around cones. Pt ambulating with RW and CGA  with verbal cues intermittently to maintain TDWB LL. Pt returned to room and pivoted to recliner with supervision. OT provided pt with ice back for L LE and left in care of nursing staff to administer medications.  OT Evaluation Precautions/Restrictions  Precautions Precautions: Fall Restrictions Weight Bearing Restrictions: Yes LLE Weight Bearing: Touchdown weight bearing Pain  Pt reports pain 5/10 in both sessions. Repositioned and rest for pain management.  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 Prior Function Level of Independence: Independent with basic ADLs, Independent with homemaking with ambulation  Able to Take Stairs?: Yes Driving: Yes 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 Leisure: Hobbies-yes (Comment)(sits with elderly lady Tues-Fri (takes her shopping), spending time with 23 y/o grandson) Comments: pt reports 3 falls in the past year ADL ADL Eating: Independent Grooming: Independent Upper Body Bathing: Supervision/safety Lower Body Bathing: Contact guard Upper Body Dressing: Supervision/safety Lower Body Dressing: Minimal assistance Toileting: Minimal assistance Toilet Transfer: Contact guard Vision Baseline Vision/History: Wears glasses Wears Glasses: Reading only Patient Visual Report: No change from  baseline Perception  Perception: Within Functional Limits Praxis Praxis: Intact Cognition Overall Cognitive Status: Within Functional Limits for tasks assessed Arousal/Alertness: Awake/alert Orientation Level: Place;Situation;Person Person: Oriented Place: Oriented Situation: Oriented Year: 2021 Month: March Day of Week: Correct Memory: Appears intact Immediate Memory Recall: Sock;Blue;Bed Memory Recall Sock: Without Cue Memory Recall Blue: Without Cue Memory Recall Bed: Without Cue Awareness: Appears intact Problem Solving: Appears intact Safety/Judgment: Appears intact Sensation Sensation Light Touch: Appears Intact Proprioception: Appears Intact Coordination Gross Motor Movements are Fluid and Coordinated: Yes Fine Motor Movements are Fluid and Coordinated: Yes Motor  Motor Motor: Abnormal postural alignment and control Motor - Skilled Clinical Observations: LLE weakness Mobility  Bed Mobility Bed Mobility: Rolling Right;Supine to Sit;Sit to Supine Rolling Right: Supervision/verbal cueing Supine to Sit: Supervision/Verbal cueing Sit to Supine: Supervision/Verbal cueing Transfers Sit to Stand: Contact Guard/Touching assist Stand to Sit: Contact Guard/Touching assist  Trunk/Postural Assessment  Postural Control Postural Control: Deficits on evaluation Protective Responses: delayed 2/2 TDWB LLE  Balance Balance Balance Assessed: Yes Dynamic Standing Balance Dynamic Standing - Balance Support: Bilateral upper extremity supported;During functional activity Dynamic Standing - Level of Assistance: 4: Min assist Extremity/Trunk Assessment RUE Assessment RUE Assessment: Within Functional Limits LUE Assessment LUE Assessment: Within Functional Limits     Refer to Care Plan for Long Term Goals  Recommendations for other services: None    Discharge Criteria: Patient will be discharged from OT if patient refuses treatment 3 consecutive times without medical  reason, if treatment goals not met, if there is a change in medical status, if patient makes no progress towards goals or if patient is discharged from hospital.  The above assessment, treatment  plan, treatment alternatives and goals were discussed and mutually agreed upon: by patient  Valma Cava 05/24/2019, 2:15 PM

## 2019-05-24 NOTE — Progress Notes (Signed)
[name] PHYSICAL MEDICINE & REHABILITATION PROGRESS NOTE   Subjective/Complaints: Had a good night. Her pain medication was a little late in coming this morning, so she was concerned about therapy tolerance. However, she did fairly well with her first session. Still has numbness in the left great toe  ROS: Patient denies fever, rash, sore throat, blurred vision, nausea, vomiting, diarrhea, cough, shortness of breath or chest pain,   headache, or mood change.    Objective:   No results found. Recent Labs    05/23/19 0136 05/24/19 0512  WBC 10.6* 8.3  HGB 11.1* 11.2*  HCT 35.1* 35.6*  PLT 255 250   Recent Labs    05/23/19 0136 05/24/19 0512  NA 139 141  K 4.5 4.5  CL 102 101  CO2 27 29  GLUCOSE 109* 115*  BUN 18 19  CREATININE 1.16* 1.06*  CALCIUM 8.7* 8.7*    Intake/Output Summary (Last 24 hours) at 05/24/2019 0952 Last data filed at 05/24/2019 0700 Gross per 24 hour  Intake 360 ml  Output --  Net 360 ml     Physical Exam: Vital Signs Blood pressure (!) 157/82, pulse 81, temperature 97.8 F (36.6 C), temperature source Oral, resp. rate 20, height 5\' 7"  (1.702 m), weight 81.9 kg, SpO2 97 %. Constitutional: No distress . Vital signs reviewed. HEENT: EOMI, oral membranes moist Neck: supple Cardiovascular: RRR without murmur. No JVD    Respiratory/Chest: CTA Bilaterally without wheezes or rales. Normal effort    GI/Abdomen: BS +, non-tender, non-distended Ext: no clubbing, cyanosis, or edema Skin: Clean and intact without signs of breakdown Neuro: alert, normal insight and awareness.. Cranial nerves 2-12 are intact. Sensory exam is normal except for along dorsal/planta aspects of left great toe. Reflexes are 2+ in all 4's. Fine motor coordination is intact. No tremors. Motor function is grossly 5/5 with the exception of left lower extremity which is limited by pain. She doesn't appear to have any intrinsic muscle weakness in left foot.  Psych: Pt's affect is  appropriate. Pt is cooperative    Assessment/Plan: 1. Functional deficits secondary to pelvic fractures which require 3+ hours per day of interdisciplinary therapy in a comprehensive inpatient rehab setting.  Physiatrist is providing close team supervision and 24 hour management of active medical problems listed below.  Physiatrist and rehab team continue to assess barriers to discharge/monitor patient progress toward functional and medical goals  Care Tool:  Bathing              Bathing assist       Upper Body Dressing/Undressing Upper body dressing   What is the patient wearing?: Pull over shirt    Upper body assist Assist Level: Independent    Lower Body Dressing/Undressing Lower body dressing      What is the patient wearing?: Pants     Lower body assist Assist for lower body dressing: Independent     Toileting Toileting    Toileting assist Assist for toileting: Minimal Assistance - Patient > 75%     Transfers Chair/bed transfer  Transfers assist     Chair/bed transfer assist level: Minimal Assistance - Patient > 75%     Locomotion Ambulation   Ambulation assist              Walk 10 feet activity   Assist           Walk 50 feet activity   Assist           Walk 150 feet  activity   Assist           Walk 10 feet on uneven surface  activity   Assist           Wheelchair     Assist               Wheelchair 50 feet with 2 turns activity    Assist            Wheelchair 150 feet activity     Assist          Blood pressure (!) 157/82, pulse 81, temperature 97.8 F (36.6 C), temperature source Oral, resp. rate 20, height 5\' 7"  (1.702 m), weight 81.9 kg, SpO2 97 %.  Medical Problem List and Plan: 1. Fractures to left inferior and superior pubic ramus, left acetabulum, left sacrum/SI jt with associated pain and functional deficits             -patient may shower                -ELOS/Goals: modI in PT, OT, I in SLP for 5-7 days  -may have partial sciatic nerve involvement d/t fractures with isolated sensory loss of left great toe--observation only  -team conference today 2.  Antithrombotics: -DVT/anticoagulation:  Pharmaceutical: Lovenox             -antiplatelet therapy: Plavix 3. Pain Management: ON tylenol 1000 mg qid-->decrease due to age, baclofen 5 mg tid, gabapentin 600 mg bid/900 mg hs, oxycodone 10 mg qid, Oxycontin 10mg  BID, Ice prn             3/16 pain is controlled. Not using oxycodone around the clock.    -will take oxy ir prior to am activities.  4. Mood: LCSW to follow for evaluation and support             -antipsychotic agents: N/A 5. Neuropsych: This patient is capable of making decisions on her own behalf. 6. Skin/Wound Care: Routine pressure relief measures.  7. Fluids/Electrolytes/Nutrition: Monitor I/O. Check lytes in am.  8. HTN: Monitor BP tid--continue Lasix. Has been elevated. Add Amlodipine 2.5mg  daily.  9. H/o migraines: On Zoloft, Zonegran and inderal.  --appear controlled at present 10. Leucocytosis: Monitor for signs of infection.  3/16 wbc's down to 8.3 11. Acute renal failure/Hyponateremia: BUN/SCr 20/1.10 at admission-->35/1.68.Has trended up with addition of Toradol which we d'ced  -3/16 bun/cr WNL  -all labs personally reviewed.  12. Constipation: On Senna and miralax.   -moving bowels.     LOS: 1 days A FACE TO FACE EVALUATION WAS PERFORMED  Meredith Staggers 05/24/2019, 9:52 AM

## 2019-05-24 NOTE — Progress Notes (Signed)
Social Work Patient ID: [name], female   DOB: [birth year], 69 y.o.   MRN: [id number]   SW met with pt and pt dtr in law Vondell Sowell [phone]) in pt room to provide updates from team conference and anticipated d/c date 05/29/2019. SW informed there will be updates on d/c recommendations. Pt states primary contact should be her dtr in law to discuss d/c needs.   [name], MSW, [place] Office: [phone] Cell: [phone] Fax: [phone]

## 2019-05-25 ENCOUNTER — Inpatient Hospital Stay (HOSPITAL_COMMUNITY): Payer: PRIVATE HEALTH INSURANCE | Admitting: Physical Therapy

## 2019-05-25 ENCOUNTER — Inpatient Hospital Stay (HOSPITAL_COMMUNITY): Payer: PRIVATE HEALTH INSURANCE | Admitting: Occupational Therapy

## 2019-05-25 LAB — BASIC METABOLIC PANEL
Anion gap: 11 (ref 5–15)
BUN: 22 mg/dL (ref 8–23)
CO2: 28 mmol/L (ref 22–32)
Calcium: 8.8 mg/dL — ABNORMAL LOW (ref 8.9–10.3)
Chloride: 102 mmol/L (ref 98–111)
Creatinine, Ser: 1.04 mg/dL — ABNORMAL HIGH (ref 0.44–1.00)
GFR calc Af Amer: >60 mL/min (ref >60)
GFR calc non Af Amer: 56 mL/min — ABNORMAL LOW (ref >60)
Glucose, Bld: 111 mg/dL — ABNORMAL HIGH (ref 70–99)
Potassium: 4.2 mmol/L (ref 3.5–5.1)
Sodium: 141 mmol/L (ref 135–145)

## 2019-05-25 LAB — CBC
HCT: 35.3 % — ABNORMAL LOW (ref 36.0–46.0)
Hemoglobin: 11.1 g/dL — ABNORMAL LOW (ref 12.0–15.0)
MCH: 27.4 pg (ref 26.0–34.0)
MCHC: 31.4 g/dL (ref 30.0–36.0)
MCV: 87.2 fL (ref 80.0–100.0)
Platelets: 253 10*3/uL (ref 150–400)
RBC: 4.05 MIL/uL (ref 3.87–5.11)
RDW: 14.6 % (ref 11.5–15.5)
WBC: 7.6 10*3/uL (ref 4.0–10.5)
nRBC: 0 % (ref 0.0–0.2)

## 2019-05-25 NOTE — Progress Notes (Signed)
Physical Therapy Session Note  Patient Details  Name: [name] MRN: [id number] Date of Birth: [birth year]  Today's Date: 05/25/2019 PT Individual Time: 0809-0903 PT Individual Time Calculation (min): 54 min   Short Term Goals: Week 1:  PT Short Term Goal 1 (Week 1): STG = LTG due to short ELOS.  Skilled Therapeutic Interventions/Progress Updates:  Pt received in recliner & agreeable to tx. Sit<>stands throughout session from recliner & w/c with supervision. Pt ambulates room>gym with RW & supervision and once in gym therapist dons weight bearing shoe for auditory feedback re: weight bearing on LLE. Pt ambulates 50 ft + 100 ft + 100 ft with RW & supervision with increased weight bearing through LLE with several beeps decreasing to only a few beeps by last gait trial. Pt with greater difficulty maintaining TDWB LLE as she fatigues. Therapist provides cuing to not drag LLE on floor but to continue stepping as normal. Stair negotiation, 4 steps (6") x 2 trials with B rails to simulate home entry with therapist providing demo & instructional cuing for compensatory pattern with pt demonstrating improved ability to maintain TDWB LLE when descending stairs vs ascending. Pt ambulates back to room in same manner noted above. Pt left in recliner with chair alarm donned, all needs in reach.  Therapy Documentation Precautions:  Precautions Precautions: Fall Restrictions Weight Bearing Restrictions: Yes LLE Weight Bearing: Touchdown weight bearing  Pain: L ankle & L buttocks cheek, 4/10, pt reports she's premedicated & rest breaks provided PRN.    Therapy/Group: Individual Therapy  Waunita Schooner 05/25/2019, 9:05 AM

## 2019-05-25 NOTE — Progress Notes (Addendum)
Social Work Patient ID: [name], female   DOB: Jul 16, 1950, 69 y.o.   MRN: [id number]   SW left message for [name]/[facility] [phone]) to follow-up about if pt remains active for [facility]. SW waiting on follow-up.  *SW received updates from [facility] stating she has HHPT/OT orders for pt already. SW informed on pt anticipated d/c date. No new orders needed. SW will follow-up if there are any changes with her d/c.   [name], MSW, [place] Office: [phone] Cell: [phone] Fax: [phone]

## 2019-05-25 NOTE — Progress Notes (Signed)
Physical Therapy Session Note  Patient Details  Name: [name] MRN: [id number] Date of Birth: 1950/11/29  Today's Date: 05/25/2019 PT Individual Time: 1000-1111 PT Individual Time Calculation (min): 71 min   Short Term Goals: Week 1:  PT Short Term Goal 1 (Week 1): STG = LTG due to short ELOS.  Skilled Therapeutic Interventions/Progress Updates:    Patient received up in recliner, very pleasant and willing to participate in PT today. Able to transfer and gait train generally with S and RW during session but continues to require heavy levels of verbal feedback to maintain appropriate precautions for TDWB LLE without WB alarm shoe, also when she has alarm shoe on but fatigue is increased. Able to self-propel distances of 168f with distant S and BUEs easily. Spent majority of session practicing gait and stair navigation with WB alarm shoe, initially able to gait train with almost no beeps but increased to a moderate level of beeping as fatigue increased and continues to drag her L toe with swing phase on L side if cues are not provided to improve gait pattern. Practiced multiple and single steps forward and backward with WB alarm shoe on- needed mod-max cues for correct sequencing and even with cues present sometimes continued to lead going up with L LE/down with R LE. Extensive cuing and education given correct sequencing and importance of maintaining correct precautions. Definite lack of BUE strength and endurance which is limiting her ability to consistently offload LLE. Declined to try steps with RW. Otherwise worked on [employer] provided extensive amount of self care regarding fatigue/energy management, importance of correct sequencing with gait/steps, importance of following WB precautions to allow for proper healing. She was left up in her recliner with all needs met and RN aware of request for pain medication, chair alarm active.   Therapy Documentation Precautions:   Precautions Precautions: Fall Restrictions Weight Bearing Restrictions: Yes LLE Weight Bearing: Touchdown weight bearing   Pain: Pain Assessment Pain Scale: 0-10 Pain Score: 4  Pain Type: Acute pain Pain Location: Leg Pain Orientation: Left Pain Descriptors / Indicators: Aching;Discomfort Pain Onset: On-going Patients Stated Pain Goal: 0 Pain Intervention(s): Other (Comment)(premedicated) Multiple Pain Sites: No    Therapy/Group: Individual Therapy   KWindell Norfolk DPT, PN1   Supplemental Physical Therapist [place]   Pager [phone] Rehab Office [phone]   05/25/2019, 12:26 PM

## 2019-05-25 NOTE — Progress Notes (Signed)
[facility] PHYSICAL MEDICINE & REHABILITATION PROGRESS NOTE   Subjective/Complaints: Having a little pain in leg/hip today. Just received oxycodone. Had a good day with therapy yesterday and very encouraged overall.   ROS: Patient denies fever, rash, sore throat, blurred vision, nausea, vomiting, diarrhea, cough, shortness of breath or chest pain, headache, or mood change.      Objective:   No results found. Recent Labs    05/24/19 0512 05/25/19 0515  WBC 8.3 7.6  HGB 11.2* 11.1*  HCT 35.6* 35.3*  PLT 250 253   Recent Labs    05/24/19 0512 05/25/19 0515  NA 141 141  K 4.5 4.2  CL 101 102  CO2 29 28  GLUCOSE 115* 111*  BUN 19 22  CREATININE 1.06* 1.04*  CALCIUM 8.7* 8.8*    Intake/Output Summary (Last 24 hours) at 05/25/2019 0835 Last data filed at 05/25/2019 0731 Gross per 24 hour  Intake 462 ml  Output --  Net 462 ml     Physical Exam: Vital Signs Blood pressure (!) 154/83, pulse 77, temperature 98.1 F (36.7 C), resp. rate 16, height 5\' 7"  (1.702 m), weight 81.9 kg, SpO2 98 %. Constitutional: No distress . Vital signs reviewed. obese HEENT: EOMI, oral membranes moist Neck: supple Cardiovascular: RRR without murmur. No JVD    Respiratory/Chest: CTA Bilaterally without wheezes or rales. Normal effort    GI/Abdomen: BS +, non-tender, non-distended Ext: no clubbing, cyanosis, or edema Skin: Clean and intact without signs of breakdown Neuro: alert, normal insight and awareness.. Cranial nerves 2-12 are intact. Sensory exam is normal except for along dorsal/planta aspects of left great toe--no change. Reflexes are 2+ in all 4's. Fine motor coordination is intact. No tremors. Motor function is grossly 5/5 with the exception of left lower extremity which is limited by pain.   Psych: pleasant    Assessment/Plan: 1. Functional deficits secondary to pelvic fractures which require 3+ hours per day of interdisciplinary therapy in a comprehensive inpatient rehab  setting.  Physiatrist is providing close team supervision and 24 hour management of active medical problems listed below.  Physiatrist and rehab team continue to assess barriers to discharge/monitor patient progress toward functional and medical goals  Care Tool:  Bathing    Body parts bathed by patient: Front perineal area, Right arm, Left arm, Chest, Abdomen, Buttocks, Right upper leg, Left upper leg, Right lower leg, Left lower leg, Face         Bathing assist Assist Level: Set up assist     Upper Body Dressing/Undressing Upper body dressing   What is the patient wearing?: Pull over shirt    Upper body assist Assist Level: Independent    Lower Body Dressing/Undressing Lower body dressing      What is the patient wearing?: Underwear/pull up, Pants     Lower body assist Assist for lower body dressing: Independent     Toileting Toileting    Toileting assist Assist for toileting: Set up assist     Transfers Chair/bed transfer  Transfers assist     Chair/bed transfer assist level: Supervision/Verbal cueing     Locomotion Ambulation   Ambulation assist      Assist level: Contact Guard/Touching assist Assistive device: Walker-rolling Max distance: >150 ft   Walk 10 feet activity   Assist     Assist level: Contact Guard/Touching assist Assistive device: Walker-rolling   Walk 50 feet activity   Assist    Assist level: Contact Guard/Touching assist Assistive device: Walker-rolling  Walk 150 feet activity   Assist    Assist level: Contact Guard/Touching assist Assistive device: Walker-rolling    Walk 10 feet on uneven surface  activity   Assist     Assist level: Contact Guard/Touching assist Assistive device: Walker-rolling   Wheelchair     Assist Will patient use wheelchair at discharge?: No Type of Wheelchair: Manual    Wheelchair assist level: Supervision/Verbal cueing Max wheelchair distance: 150 ft     Wheelchair 50 feet with 2 turns activity    Assist        Assist Level: Supervision/Verbal cueing   Wheelchair 150 feet activity     Assist      Assist Level: Supervision/Verbal cueing   Blood pressure (!) 154/83, pulse 77, temperature 98.1 F (36.7 C), resp. rate 16, height 5\' 7"  (1.702 m), weight 81.9 kg, SpO2 98 %.  Medical Problem List and Plan: 1. Fractures to left inferior and superior pubic ramus, left acetabulum, left sacrum/SI jt with associated pain and functional deficits             -patient may shower               -ELOS/Goals: modI in PT, OT, I in SLP --->05/29/19  -may have partial sciatic nerve involvement d/t fractures with isolated sensory loss of left great toe--observation only  - 2.  Antithrombotics: -DVT/anticoagulation:  Pharmaceutical: Lovenox             -antiplatelet therapy: Plavix 3. Pain Management: ON tylenol 1000 mg qid-->decrease due to age, baclofen 5 mg tid, gabapentin 600 mg bid/900 mg hs, oxycodone 10 mg qid, Oxycontin 10mg  BID, Ice prn             3/16 pain is controlled. Not using oxycodone around the clock.    -  oxy ir prior to am activities.  4. Mood: LCSW to follow for evaluation and support             -antipsychotic agents: N/A 5. Neuropsych: This patient is capable of making decisions on her own behalf. 6. Skin/Wound Care: Routine pressure relief measures.  7. Fluids/Electrolytes/Nutrition: Monitor I/O. Check lytes in am.  8. HTN: Monitor BP tid--continue Lasix. Has been elevated. Add Amlodipine 2.5mg  daily.  9. H/o migraines: On Zoloft, Zonegran and inderal.  --appear controlled at present 10. Leucocytosis: Monitor for signs of infection.  3/16 wbc's down to 8.3 11. Acute renal failure/Hyponateremia: BUN/SCr 20/1.10 at admission-->35/1.68.Has trended up with addition of Toradol which we d'ced  -3/16 bun/cr WNL   12. Constipation: On Senna and miralax.   -moving bowels.     LOS: 2 days A FACE TO FACE EVALUATION  WAS PERFORMED  Meredith Staggers 05/25/2019, 8:35 AM

## 2019-05-25 NOTE — Plan of Care (Signed)
  Problem: Education: Goal: Verbalization of understanding the information provided (i.e., activity precautions, restrictions, etc) will improve Outcome: Progressing Goal: Individualized Educational Video(s) Outcome: Progressing   Problem: Activity: Goal: Ability to ambulate and perform ADLs will improve Description: Patient will be able to ambulate with mod I assist Outcome: Progressing   Problem: Clinical Measurements: Goal: Postoperative complications will be avoided or minimized Outcome: Progressing   Problem: Self-Concept: Goal: Ability to maintain and perform role responsibilities to the fullest extent possible will improve Description: Patient will be able to perform with mod I assist  Outcome: Progressing   Problem: Pain Management: Goal: Pain level will decrease Outcome: Progressing   Problem: Consults Goal: RH GENERAL PATIENT EDUCATION Description: See Patient Education module for education specifics. Outcome: Progressing Goal: Skin Care Protocol Initiated - if Braden Score 18 or less Description: If consults are not indicated, leave blank or document N/A Outcome: Progressing Goal: Nutrition Consult-if indicated Outcome: Progressing Goal: Diabetes Guidelines if Diabetic/Glucose > 140 Description: If diabetic or lab glucose is > 140 mg/dl - Initiate Diabetes/Hyperglycemia Guidelines & Document Interventions  Outcome: Progressing   Problem: RH BOWEL ELIMINATION Goal: RH STG MANAGE BOWEL WITH ASSISTANCE Description: STG Manage Bowel with min Assistance. Outcome: Progressing Goal: RH STG MANAGE BOWEL W/MEDICATION W/ASSISTANCE Description: STG Manage Bowel with Medication with min Assistance. Outcome: Progressing   Problem: RH BLADDER ELIMINATION Goal: RH STG MANAGE BLADDER WITH ASSISTANCE Description: STG Manage Bladder With min Assistance Outcome: Progressing Goal: RH STG MANAGE BLADDER WITH MEDICATION WITH ASSISTANCE Description: STG Manage Bladder With  Medication With min Assistance. Outcome: Progressing Goal: RH STG MANAGE BLADDER WITH EQUIPMENT WITH ASSISTANCE Description: STG Manage Bladder With Equipment With min Assistance Outcome: Progressing   Problem: RH SKIN INTEGRITY Goal: RH STG SKIN FREE OF INFECTION/BREAKDOWN Outcome: Progressing Goal: RH STG MAINTAIN SKIN INTEGRITY WITH ASSISTANCE Description: STG Maintain Skin Integrity With min Assistance. Outcome: Progressing Goal: RH STG ABLE TO PERFORM INCISION/WOUND CARE W/ASSISTANCE Description: STG Able To Perform Incision/Wound Care With min Assistance. Outcome: Progressing   Problem: RH SAFETY Goal: RH STG ADHERE TO SAFETY PRECAUTIONS W/ASSISTANCE/DEVICE Description: STG Adhere to Safety Precautions With min Assistance/Device. Outcome: Progressing Goal: RH STG DECREASED RISK OF FALL WITH ASSISTANCE Description: STG Decreased Risk of Fall With min Assistance. Outcome: Progressing   Problem: RH PAIN MANAGEMENT Goal: RH STG PAIN MANAGED AT OR BELOW PT'S PAIN GOAL Description: Pain < or = 4 Outcome: Progressing   Problem: RH KNOWLEDGE DEFICIT GENERAL Goal: RH STG INCREASE KNOWLEDGE OF SELF CARE AFTER HOSPITALIZATION Outcome: Progressing

## 2019-05-25 NOTE — Care Management (Signed)
Inpatient Rehabilitation Center Individual Statement of Services  Patient Name:  [name]  Date:  05/25/2019  Welcome to the [facility].  Our goal is to provide you with an individualized program based on your diagnosis and situation, designed to meet your specific needs.  With this comprehensive rehabilitation program, you will be expected to participate in at least 3 hours of rehabilitation therapies Monday-Friday, with modified therapy programming on the weekends.  Your rehabilitation program will include the following services:  Physical Therapy (PT), Occupational Therapy (OT), 24 hour per day rehabilitation nursing, Therapeutic Recreaction (TR), Psychology, Neuropsychology, Case Management (Social Worker), Rehabilitation Medicine, Nutrition Services, Pharmacy Services and Other  Weekly team conferences will be held on Tuesdays to discuss your progress.  Your Social Worker will talk with you frequently to get your input and to update you on team discussions.  Team conferences with you and your family in attendance may also be held.  Expected length of stay: 4-7 days  Overall anticipated outcome: Independent  Depending on your progress and recovery, your program may change. Your Social Worker will coordinate services and will keep you informed of any changes. Your Social Worker's name and contact numbers are listed  below.  The following services may also be recommended but are not provided by the [facility] will be made to provide these services after discharge if needed.  Arrangements include referral to agencies that provide these services.  Your insurance has been verified to be:  Medicare A/B  Your primary doctor is: [name]  Pertinent information will be shared with your doctor and  your insurance company.  Social Worker:  [name], LCSWA  Information discussed with and copy given to patient by: Rana Snare, 05/25/2019, 11:17 AM

## 2019-05-25 NOTE — Progress Notes (Signed)
Social Work Assessment and Plan   Patient Details  Name: [name] MRN: [id number] Date of Birth: September 01, 1950  Today's Date: 05/25/2019  Problem List:  Patient Active Problem List   Diagnosis Date Noted  . Multiple pelvic fractures ([place]) 05/23/2019  . Muscular deconditioning   . Frequent falls   . Nondisplaced fracture of anterior wall of left acetabulum   . Fracture of multiple pubic rami, left 05/14/2019  . Chronic cough 01/05/2019  . Fatigue 11/04/2018  . Exposure to COVID-19 virus 10/05/2018  . Pre-operative clearance 04/21/2018  . PVD (peripheral vascular disease) ([place]) 04/20/2018  . CRI (chronic renal insufficiency), stage 3 (moderate) 04/20/2018  . Pyelonephritis 04/05/2018  . Complicated UTI (urinary tract infection) 04/04/2018  . Plantar fasciitis 01/10/2018  . Chronic anxiety 01/08/2018  . Gastroesophageal reflux disease 01/08/2018  . Menopausal syndrome 01/08/2018  . Pulmonary embolism ([place])   . Coronary artery calcification   . Dyspnea 07/26/2017  . Osteoarthritis of knee 04/08/2017  . Status post cholecystectomy   . Hyperlipidemia LDL goal <70 04/02/2015  . Chronic migraine without aura, with status migrainosus 09/26/2014  . Migraine variant 10/10/2013  . TIA (transient ischemic attack) 10/10/2013  . Essential hypertension 07/18/2013  . 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 Neuralgia, neuritis, and radiculitis, unspecified 02/04/2012  . Myofascial muscle pain 07/15/2011   Past Medical History:  Past Medical History:  Diagnosis Date  . Anemia   . Cervical neuritis   . Chronic kidney disease    stage 3  . Dyslipidemia   . GERD (gastroesophageal reflux disease)   . History of transient ischemic attack (TIA)   . HTN (hypertension)   . Hyperlipidemia   . Migraine   . Migraine headache   . Occipital neuralgia    Left  . Pulmonary embolism ([place]) 2017  . Renal insufficiency   . Stroke [facility]) 10/2013   Past Surgical History:  Past Surgical  History:  Procedure Laterality Date  . ABDOMINAL HYSTERECTOMY    . BILIARY STENT PLACEMENT N/A 12/12/2015   Procedure: BILIARY STENT PLACEMENT;  Surgeon: Doran Stabler, MD;  Location: [facility] ENDOSCOPY;  Service: Endoscopy;  Laterality: N/A;  . BLADDER REPAIR    . CARPAL TUNNEL RELEASE    . CERVICAL FUSION    . CERVICAL SPINE SURGERY    . CHOLECYSTECTOMY N/A 12/06/2015   Procedure: LAPAROSCOPIC CHOLECYSTECTOMY WITH  INTRAOPERATIVE CHOLANGIOGRAM;  Surgeon: Stark Klein, MD;  Location: [facility];  Service: General;  Laterality: N/A;  . ELBOW SURGERY    . ERCP N/A 12/12/2015   Procedure: ENDOSCOPIC RETROGRADE CHOLANGIOPANCREATOGRAPHY (ERCP);  Surgeon: Doran Stabler, MD;  Location: [facility] ENDOSCOPY;  Service: Endoscopy;  Laterality: N/A;  . ESOPHAGOGASTRODUODENOSCOPY N/A 01/28/2016   Procedure: ESOPHAGOGASTRODUODENOSCOPY (EGD) Biliary STENT removal;  Surgeon: Doran Stabler, MD;  Location: [facility] ENDOSCOPY;  Service: Gastroenterology;  Laterality: N/A;  . IR GENERIC HISTORICAL  12/17/2015   IR US GUIDE BX ASP/DRAIN 12/17/2015 [facility]INTER[facility] RAD  . IR GENERIC HISTORICAL  12/17/2015   IR GUIDED DRAIN W CATHETER PLACEMENT 12/17/2015 [facility]  . IR GENERIC HISTORICAL  12/17/2015   IR SINUS/FIST TUBE CHK-NON GI 12/17/2015 [facility]  . KNEE SURGERY    . LUNG SURGERY    . TEE WITHOUT CARDIOVERSION N/A 12/19/2015   Procedure: TRANSESOPHAGEAL ECHOCARDIOGRAM (TEE);  Surgeon: Josue Hector, MD;  Location: [facility] ENDOSCOPY;  Service: Cardiovascular;  Laterality: N/A;   Social History:  reports that she quit smoking about 7 years ago. Her smoking  use included cigarettes. She smoked 0.30 packs per day. She has never used smokeless tobacco. She reports that she does not drink alcohol or use drugs.  Family / Support Systems Marital Status: Married How Long?: 52 years Patient Roles: Spouse, Parent Spouse/Significant Other: Khalis Hittle (husband): [phone] Children: 1 adult son Other Supports: Dtr in Social worker [phone]); Gertie Baron (brother/POA): [phone] Anticipated Caregiver: Pt will have 24/7 sueprvision from her husband, will have addtl assistance from her dtr in law Ability/Limitations of Caregiver: Pt husband is not physically able to lift Caregiver Availability: 24/7 Family Dynamics: Pt lives with husband. 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 Read: Yes Write: Yes Employment Status: Employed Name of Employer: self-employed Return to Work Plans: When physicall able Public relations account executive Issues: Denies Guardian/Conservator: Denies   Abuse/Neglect Abuse/Neglect Assessment Can Be Completed: Yes Physical Abuse: Denies Verbal Abuse: Denies Sexual Abuse: Denies Exploitation of patient/patient's resources: Denies Self-Neglect: Denies  Emotional Status Pt's affect, behavior and adjustment status: Pt appears to be adjusting to condition Recent Psychosocial Issues: Denies Psychiatric History: Pt admits to depression since stroke in 2015. Currently takes Zoloft prescribed by PCP. Substance Abuse History: Denies. Admits to quitting cigaerttes 15 yrs ago d/t grandson buying her a vape.  Patient / Family Perceptions, Expectations & Goals Pt/Family understanding of illness & functional limitations: Pt and pt family have a general understanding of her condition Premorbid pt/family roles/activities: Pt and pt husbadn managed house needs. Pt managed bills Anticipated changes in roles/activities/participation: No changes  Recruitment consultant: None Premorbid Home Care/DME Agencies: None Transportation available at discharge: Pt dtr in law to transport her home  Discharge Planning Living Arrangements: Spouse/significant other, Children, Other  relatives Support Systems: Spouse/significant other, Children, Other relatives Type of Residence: Private residence Insurance Resources: [employer], Multimedia programmer (specify) Financial Resources: SSI, Other (Comment) Financial Screen Referred: No Living Expenses: Own Money Management: Patient Does the patient have any problems obtaining your medications?: No Social Work Anticipated Follow Up Needs: HH/OP  Clinical Impression SW met with pt and pt dtr in law [place] in room to introduce self, explain role, and discuss discharge process. Only DME is a cane.   Ronalee Scheunemann A Beauden T[place]ont 05/25/2019, 5:10 PM

## 2019-05-25 NOTE — Progress Notes (Signed)
Occupational Therapy Session Note  Patient Details  Name: [name] MRN: [id number] Date of Birth: 1950/10/01  Today's Date: 05/25/2019 OT Individual Time: [id number] OT Individual Time Calculation (min): 54 min    Short Term Goals: Week 1:  OT Short Term Goal 1 (Week 1): LTG=STG 2/2 ELOS  Skilled Therapeutic Interventions/Progress Updates:    Upon entering the room, pt seated in recliner chair with 4/10 pain in L LE. Pt reports being fatigued from prior therapy sessions and requests to shower. Pt ambulating with RW to dresser and obtains clothing items with min guard for balance with 1 UE support. Pt entering into bathroom and seated on commode for toileting needed. Pt doffing clothing items with min verbal cuing for safety awareness. Pt transferred onto shower for bathing while seated with overall supervision and min guard for balance to wash buttocks and dry self. Pt transitioned to sit onto commode to don underclothes and ambulating back into room for dressing tasks with sit <>stand from recliner chair. Pt needing CGA for balance for LB clothing management in order to maintain weight bearing. Pt given ice for L LE at end of session to address pain management. Call bell and all needed items within reach upon exiting the room.   Therapy Documentation Precautions:  Precautions Precautions: Fall Restrictions Weight Bearing Restrictions: Yes LLE Weight Bearing: Touchdown weight bearing Vital Signs: Therapy Vitals Temp: 98.1 F (36.7 C) Temp Source: Oral Pulse Rate: 88 Resp: 20 BP: 118/76 Patient Position (if appropriate): Sitting Oxygen Therapy SpO2: 94 % O2 Device: Room Air Pain: Pain Assessment Pain Scale: 0-10 Pain Score: 3  Pain Type: Surgical pain Pain Location: Leg Pain Orientation: Left Pain Descriptors / Indicators: Aching Pain Onset: On-going Patients Stated Pain Goal: 3 Pain Intervention(s): Medication (See eMAR)(premedicated for therapy) ADL: ADL Eating:  Independent Grooming: Independent Upper Body Bathing: Supervision/safety Lower Body Bathing: Contact guard Upper Body Dressing: Supervision/safety Lower Body Dressing: Minimal assistance Toileting: Minimal assistance Toilet Transfer: Contact guard   Therapy/Group: Individual Therapy  Gypsy Decant 05/25/2019, 4:44 PM

## 2019-05-26 ENCOUNTER — Inpatient Hospital Stay (HOSPITAL_COMMUNITY): Payer: PRIVATE HEALTH INSURANCE | Admitting: Occupational Therapy

## 2019-05-26 ENCOUNTER — Inpatient Hospital Stay (HOSPITAL_COMMUNITY): Payer: PRIVATE HEALTH INSURANCE | Admitting: Physical Therapy

## 2019-05-26 NOTE — Progress Notes (Addendum)
[facility] PHYSICAL MEDICINE & REHABILITATION PROGRESS NOTE   Subjective/Complaints: Overall doing well. Having some lateral hip pain on left. Has been able to work with therapies. haappy with progress  ROS: Patient denies fever, rash, sore throat, blurred vision, nausea, vomiting, diarrhea, cough, shortness of breath or chest pain, headache, or mood change.   Objective:   No results found. Recent Labs    05/24/19 0512 05/25/19 0515  WBC 8.3 7.6  HGB 11.2* 11.1*  HCT 35.6* 35.3*  PLT 250 253   Recent Labs    05/24/19 0512 05/25/19 0515  NA 141 141  K 4.5 4.2  CL 101 102  CO2 29 28  GLUCOSE 115* 111*  BUN 19 22  CREATININE 1.06* 1.04*  CALCIUM 8.7* 8.8*    Intake/Output Summary (Last 24 hours) at 05/26/2019 0953 Last data filed at 05/26/2019 0715 Gross per 24 hour  Intake 820 ml  Output --  Net 820 ml     Physical Exam: Vital Signs Blood pressure 127/78, pulse 99, temperature 98.2 F (36.8 C), temperature source Oral, resp. rate 18, height 5\' 7"  (1.702 m), weight 81.9 kg, SpO2 98 %. Constitutional: No distress . Vital signs reviewed. HEENT: EOMI, oral membranes moist Neck: supple Cardiovascular: RRR without murmur. No JVD    Respiratory/Chest: CTA Bilaterally without wheezes or rales. Normal effort    GI/Abdomen: BS +, non-tender, non-distended Ext: no clubbing, cyanosis, or edema Skin: Clean and intact without signs of breakdown Neuro: alert, normal insight and awareness.. Cranial nerves 2-12 are intact. Sensory exam is normal except for along dorsal/planta aspects of left great toe--no change. Reflexes are 2+ in all 4's. Fine motor coordination is intact. No tremors. Motor function is grossly 5/5 with the exception of left lower extremity which is limited by pain.  --no motor changes Psych: pleasant Musc: left leg tender with ROM at hip, antalgic LLE    Assessment/Plan: 1. Functional deficits secondary to pelvic fractures which require 3+ hours per day of  interdisciplinary therapy in a comprehensive inpatient rehab setting.  Physiatrist is providing close team supervision and 24 hour management of active medical problems listed below.  Physiatrist and rehab team continue to assess barriers to discharge/monitor patient progress toward functional and medical goals  Care Tool:  Bathing    Body parts bathed by patient: Front perineal area, Right arm, Left arm, Chest, Abdomen, Buttocks, Right upper leg, Left upper leg, Right lower leg, Left lower leg, Face         Bathing assist Assist Level: Contact Guard/Touching assist     Upper Body Dressing/Undressing Upper body dressing   What is the patient wearing?: Hospital gown only    Upper body assist Assist Level: Set up assist    Lower Body Dressing/Undressing Lower body dressing      What is the patient wearing?: Hospital gown only     Lower body assist Assist for lower body dressing: Contact Guard/Touching assist     Toileting Toileting    Toileting assist Assist for toileting: Contact Guard/Touching assist     Transfers Chair/bed transfer  Transfers assist     Chair/bed transfer assist level: Supervision/Verbal cueing     Locomotion Ambulation   Ambulation assist      Assist level: Supervision/Verbal cueing Assistive device: Walker-rolling Max distance: 150 ft   Walk 10 feet activity   Assist     Assist level: Supervision/Verbal cueing Assistive device: Walker-rolling   Walk 50 feet activity   Assist    Assist  level: Supervision/Verbal cueing Assistive device: Walker-rolling    Walk 150 feet activity   Assist    Assist level: Supervision/Verbal cueing Assistive device: Walker-rolling    Walk 10 feet on uneven surface  activity   Assist     Assist level: Contact Guard/Touching assist Assistive device: Aeronautical engineer Will patient use wheelchair at discharge?: No Type of Wheelchair: Manual     Wheelchair assist level: Supervision/Verbal cueing Max wheelchair distance: 150 ft    Wheelchair 50 feet with 2 turns activity    Assist        Assist Level: Supervision/Verbal cueing   Wheelchair 150 feet activity     Assist      Assist Level: Supervision/Verbal cueing   Blood pressure 127/78, pulse 99, temperature 98.2 F (36.8 C), temperature source Oral, resp. rate 18, height 5\' 7"  (1.702 m), weight 81.9 kg, SpO2 98 %.  Medical Problem List and Plan: 1. Fractures to left inferior and superior pubic ramus, left acetabulum, left sacrum/SI jt with associated pain and functional deficits             -patient may shower               -ELOS/Goals: modI in PT, OT, I in SLP --->05/29/19--on track for this date  -may have partial sciatic nerve involvement d/t fractures with isolated sensory loss of left great toe--observation only  - 2.  Antithrombotics: -DVT/anticoagulation:  Pharmaceutical: Lovenox, check dopplers             -antiplatelet therapy: Plavix 3. Pain Management: ON tylenol 1000 mg qid-->decrease due to age, baclofen 5 mg tid, gabapentin 600 mg bid/900 mg hs, oxycodone 10 mg qid, Oxycontin 10mg  BID, Ice prn             3/16-18 pain is controlled. Not using oxycodone around the clock.    -  oxy ir prior to am activities.  4. Mood: LCSW to follow for evaluation and support             -antipsychotic agents: N/A 5. Neuropsych: This patient is capable of making decisions on her own behalf. 6. Skin/Wound Care: Routine pressure relief measures.  7. Fluids/Electrolytes/Nutrition: Monitor I/O. Check lytes in am.  8. HTN: Monitor BP tid--continue Lasix. Has been elevated. Add Amlodipine 2.5mg  daily.  9. H/o migraines: On Zoloft, Zonegran and inderal.  --appear controlled at present 10. Leucocytosis: Monitor for signs of infection.  3/16 wbc's down to 8.3  -low grade temp---no clinical signs of infection   -encourage IS OOB   -check venous dopplers 11. Acute  renal failure/Hyponateremia: BUN/SCr 20/1.10 at admission-->35/1.68.Has trended up with addition of Toradol which we d'ced  -3/16 bun/cr WNL   12. Constipation: On Senna and miralax.   -moving bowels.     LOS: 3 days A FACE TO FACE EVALUATION WAS PERFORMED  Meredith Staggers 05/26/2019, 9:53 AM

## 2019-05-26 NOTE — Progress Notes (Signed)
Occupational Therapy Session Note  Patient Details  Name: [name] MRN: [id number] Date of Birth: [birth year]  Today's Date: 05/26/2019  Session 1 OT Individual Time: 7681-1572 OT Individual Time Calculation (min): 55 min   Session 2 OT Individual Time: 1402-1500 OT Individual Time Calculation (min): 58 min   Short Term Goals: Week 1:  OT Short Term Goal 1 (Week 1): LTG=STG 2/2 ELOS  Skilled Therapeutic Interventions/Progress Updates:  Session 1   Pt greeted ambulating out of bathroom with nursing student and RW with supervision. Pt reported pain in L LE at 7/10. Nursing student obtained pain medication and administered to pt. Pt then worked on [employer] propulsion to therapy gym. UB strength/conditioning with 10 mins x2 on Sci Fit arm bike. Rest break in between sets. OT discussed dc plans, DME needs and BADL modifications with pt. Propelled wc back to room with supervision. Worked on functional ambulation in room w/RW. Educated on RW positioning at the sink for safety while washing hands. Pt returned to recliner and ice applied to L hip area. Pt left with LEs elevated, call bell in reach, and needs met.   Session 2 Pt greeted seated in recliner and agreeable to OT treatment session. Pt ambulated into bathroom with RW and close supervision. Pt able to maintain TDWB LLE throughout session. Pt transferred onto commode and had voided bladder. Pt doffed clothing seated on commode with supervision. Pt then ambulated into bathroom onto shower chair. Bathing completed with overall supervision. Pt washed out some of her clothing also while she was in to shower. Dressing completed from recliner with set-up A/supervision. Pt with improved tolerance of hip flexion to don pants today and could even don socks and shoes. Pt ambulated 100 ft x2 with seated rest break in between. OT provided pt with ice pack for L hip and pt left seated in recliner at end of session with chair alarm on, call bell in reach, and  needs met.   Therapy Documentation Precautions:  Precautions Precautions: Fall Restrictions Weight Bearing Restrictions: Yes LLE Weight Bearing: Touchdown weight bearing Pain: Pt reports pain in LLE at 7/10. Nursing administered pain medications.  Therapy/Group: Individual Therapy  Valma Cava 05/26/2019, 3:03 PM

## 2019-05-26 NOTE — Progress Notes (Signed)
Physical Therapy Session Note  Patient Details  Name: [name] MRN: [id number] Date of Birth: 07/13/1950  Today's Date: 05/26/2019 PT Individual Time: 0900-1015 PT Minutes: 69 min  Short Term Goals: Week 1:  PT Short Term Goal 1 (Week 1): STG = LTG due to short ELOS.  Skilled Therapeutic Interventions/Progress Updates:    Pt received seated in recliner in room, agreeable to PT session. Pt reports some soreness in L hip/pelvic region, not rated and reports being premedicated prior to start of therapy session. Sit to stand with Supervision to CGA to RW throughout session. Ambulation 2 x 100 ft, 1 x 120 ft with RW and Supervision to Gila for balance. Use of weight-bearing shoe during gait training for auditory feedback for adherence to [facility] precautions. Pt exhibits minimal instances of not adhering to [facility]  precautions during gait this session with instances occurring towards end of ambulation with onset of fatigue. Ascend/descend 4 x 6" stairs with 2 handrails and min to mod A with focus on ascent with RLE and descent with RLE as pt exhibits improved adherence to WBing precautions via this method. Toilet transfer with CGA, pt independent with pericare and clothing management. Returned to recliner in room. Long-sitting BLE therex: heel slides, hip abd  X 15 reps each. Provided pt with ice pack for L hip/pelvis at end of session. Pt left seated in recliner in room with needs in reach, chair alarm in place.  Therapy Documentation Precautions:  Precautions Precautions: Fall Restrictions Weight Bearing Restrictions: Yes LLE Weight Bearing: Touchdown weight bearing    Therapy/Group: Individual Therapy   Excell Seltzer, PT, DPT  05/26/2019, 10:15 AM

## 2019-05-26 NOTE — Progress Notes (Signed)
Social Work Patient ID: [name], female   DOB: 27-Apr-1950, 69 y.o.   MRN: [id number]   SW met with pt in room to discuss being established with [facility]. Pt stated she was not aware of care being established with this agency. Pt would like SW to provide a list of HHA.   [name], MSW, [place] Office: [phone] Cell: [phone] Fax: [phone]

## 2019-05-26 NOTE — IPOC Note (Signed)
Overall Plan of Care [facility]) Patient Details Name: [name] MRN: [id number] DOB: 08-11-1950  Admitting Diagnosis: Multiple pelvic fractures [facility])  Hospital Problems: Principal Problem:   Multiple pelvic fractures ([place])     Functional Problem List: Nursing Endurance  PT Balance, Sensory, Endurance, Motor, Pain  OT Balance, Endurance, Motor, Pain  SLP    TR         Basic ADL's: OT Bathing, Dressing, Toileting     Advanced  ADL's: OT       Transfers: PT Bed Mobility, Car, Bed to Chair, Manufacturing systems engineer, Metallurgist: PT Stairs, Ambulation, Wheelchair Mobility     Additional Impairments: OT None  SLP        TR      Anticipated Outcomes Item Anticipated Outcome  Self Feeding    Swallowing      Basic self-care  Mod I  Toileting  Mod I   Bathroom Transfers Mod I  Bowel/Bladder  Patient will remain continent remainder of stay  Transfers  mod I with LRAD  Locomotion  mod I with LRAD except supervision for stair negotiation  Communication     Cognition     Pain  patient will remain with a pain score of 5 or less during stay  Safety/Judgment  patient will safely be able to transfer with staff and in room   Therapy Plan: PT Intensity: Minimum of 1-2 x/day ,45 to 90 minutes PT Frequency: 5 out of 7 days PT Duration Estimated Length of Stay: 4-6 days OT Intensity: Minimum of 1-2 x/day, 45 to 90 minutes OT Frequency: 5 out of 7 days OT Duration/Estimated Length of Stay: 5-7 days     Due to the current state of emergency, patients may not be receiving their 3-hours of Medicare-mandated therapy.   Team Interventions: Nursing Interventions Pain Management  PT interventions Ambulation/gait training, Community reintegration, DME/adaptive equipment instruction, Psychosocial support, Stair training, UE/LE Strength taining/ROM, Wheelchair propulsion/positioning, UE/LE Coordination activities, Therapeutic Activities, Skin care/wound  management, Pain management, Discharge planning, Training and development officer, Cognitive remediation/compensation, Disease management/prevention, Functional mobility training, Patient/family education, Therapeutic Exercise  OT Interventions Training and development officer, Cognitive remediation/compensation, Community reintegration, Discharge planning, DME/adaptive equipment instruction, Functional mobility training, Patient/family education, Self Care/advanced ADL retraining, Therapeutic Activities, Therapeutic Exercise, UE/LE Strength taining/ROM, UE/LE Coordination activities  SLP Interventions    TR Interventions    SW/CM Interventions Discharge Planning, Psychosocial Support, Patient/Family Education   Barriers to Discharge MD  pain  Nursing      PT      OT      SLP      SW       Team Discharge Planning: Destination: PT-Home ,OT- Home , SLP-  Projected Follow-up: PT-Outpatient PT, OT-  Home health OT, SLP-  Projected Equipment Needs: PT-Rolling walker with 5" wheels, OT- Tub/shower bench, 3 in 1 bedside comode, SLP-  Equipment Details: PT- , OT-  Patient/family involved in discharge planning: PT- Patient,  OT-Patient, SLP-   MD ELOS: 05/29/19 Medical Rehab Prognosis:  Excellent Assessment: The patient has been admitted for CIR therapies with the diagnosis of multiple pelvic fx after fall. The team will be addressing functional mobility, strength, stamina, balance, safety, adaptive techniques and equipment, self-care, bowel and bladder mgt, patient and caregiver education, pain mgt, wb precautions, community reentry. Goals have been set at mod I for mobility and self-care.   Due to the current state of emergency, patients may not be receiving their 3 hours per day  of Medicare-mandated therapy.    Meredith Staggers, MD, FAAPMR      See Team Conference Notes for weekly updates to the plan of care

## 2019-05-26 NOTE — Progress Notes (Signed)
Social Work Patient ID: [name], female   DOB: [birth year], 69 y.o.   MRN: [id number]   SW left message for [name]/[facility] [phone]) to follow-up about pt reports of not being aware of being linked to [facility] and wanted to select an agency.  *Received return phone call from Cassie to discuss pt reports. States follow-up if pt changes her mind.   SW made contact with pt dtr in law [name] [phone]) to inform on above about HHA, and recommended DME: 3in1 BSC, TTB, and RW. SW informed TTB will be private pay. Reports pt husband should be contacted for co-pays for DME.  SW met withpt in room to discuss above. SW provided pt with HHA list. Pt preferred location is [facility].   SW spoke with [facility] [phone]) about HHPT/OT referral; referral accepted. SW to fax order to [facility] [phone].  SW spoke with Cassie/[facility] to inform on change in HHA to [place].   [name], MSW, [place] Office: [phone] Cell: [phone] Fax: [phone]

## 2019-05-26 NOTE — Progress Notes (Signed)
Patient had temperature of 99.3 at hs yesterday and 99.2 this AM. Linna Hoff, PA notified. PRN tylenol given.

## 2019-05-27 ENCOUNTER — Inpatient Hospital Stay (HOSPITAL_COMMUNITY): Payer: PRIVATE HEALTH INSURANCE

## 2019-05-27 ENCOUNTER — Inpatient Hospital Stay (HOSPITAL_COMMUNITY): Payer: PRIVATE HEALTH INSURANCE | Admitting: Occupational Therapy

## 2019-05-27 ENCOUNTER — Inpatient Hospital Stay (HOSPITAL_COMMUNITY): Payer: Medicare Other

## 2019-05-27 ENCOUNTER — Inpatient Hospital Stay (HOSPITAL_COMMUNITY): Payer: PRIVATE HEALTH INSURANCE | Admitting: Physical Therapy

## 2019-05-27 ENCOUNTER — Inpatient Hospital Stay (HOSPITAL_COMMUNITY): Payer: PRIVATE HEALTH INSURANCE | Admitting: *Deleted

## 2019-05-27 ENCOUNTER — Other Ambulatory Visit: Payer: Self-pay | Admitting: Physical Medicine and Rehabilitation

## 2019-05-27 DIAGNOSIS — M7989 Other specified soft tissue disorders: Secondary | ICD-10-CM

## 2019-05-27 DIAGNOSIS — R509 Fever, unspecified: Secondary | ICD-10-CM

## 2019-05-27 MED ORDER — ACETAMINOPHEN 325 MG PO TABS: 650.0000 mg | ORAL_TABLET | Freq: Three times a day (TID) | ORAL | Status: DC

## 2019-05-27 MED ORDER — BACLOFEN 5 MG PO TABS: 5.0000 mg | ORAL_TABLET | Freq: Three times a day (TID) | ORAL | 0 refills | Status: DC

## 2019-05-27 MED ORDER — RAMIPRIL 2.5 MG PO CAPS: 2.5000 mg | ORAL_CAPSULE | Freq: Every day | ORAL | 0 refills | Status: DC

## 2019-05-27 MED ORDER — PANTOPRAZOLE SODIUM 40 MG PO TBEC: 40.0000 mg | DELAYED_RELEASE_TABLET | Freq: Every day | ORAL | 0 refills | Status: DC

## 2019-05-27 MED ORDER — DICLOFENAC SODIUM 1 % EX GEL: 2.0000 g | Freq: Four times a day (QID) | CUTANEOUS | 0 refills | Status: DC

## 2019-05-27 MED ORDER — SERTRALINE HCL 100 MG PO TABS: 100.0000 mg | ORAL_TABLET | Freq: Every day | ORAL | 0 refills | Status: DC

## 2019-05-27 MED ORDER — TRAMADOL HCL 50 MG PO TABS: 50.0000 mg | ORAL_TABLET | Freq: Four times a day (QID) | ORAL | 0 refills | Status: DC | PRN

## 2019-05-27 MED ORDER — GABAPENTIN 300 MG PO CAPS: ORAL_CAPSULE | ORAL | 0 refills | Status: DC

## 2019-05-27 MED ORDER — AMLODIPINE BESYLATE 2.5 MG PO TABS: 2.5000 mg | ORAL_TABLET | Freq: Every day | ORAL | 0 refills | Status: DC

## 2019-05-27 MED ORDER — OXYCODONE HCL 5 MG PO TABS: 5.0000 mg | ORAL_TABLET | Freq: Four times a day (QID) | ORAL | 0 refills | Status: DC | PRN

## 2019-05-27 MED ORDER — FLUTICASONE PROPIONATE 50 MCG/ACT NA SUSP: 1.0000 | Freq: Every day | NASAL | 2 refills | Status: DC

## 2019-05-27 MED ORDER — POLYETHYLENE GLYCOL 3350 17 G PO PACK: 17.0000 g | PACK | Freq: Every day | ORAL | 0 refills | Status: DC | PRN

## 2019-05-27 MED ORDER — VITAMIN D (ERGOCALCIFEROL) 1.25 MG (50000 UNIT) PO CAPS: 50000.0000 [IU] | ORAL_CAPSULE | ORAL | 0 refills | Status: DC

## 2019-05-27 MED ORDER — OXYCODONE HCL ER 10 MG PO T12A: 10.0000 mg | EXTENDED_RELEASE_TABLET | Freq: Two times a day (BID) | ORAL | 0 refills | Status: DC

## 2019-05-27 NOTE — Discharge Summary (Signed)
Occupational Therapy Discharge Summary  Patient Details  Name: [name] MRN: [id number] Date of Birth: 09-Nov-1950  Patient has met 9 of 9 long term goals due to improved activity tolerance, improved balance, ability to compensate for deficits, improved awareness and improved coordination.  Patient to discharge at overall Modified Independent level. Pt reports her spouse will be at home 24/7 to assist her as needed.  All goals met.   Recommendation:  Patient will benefit from ongoing skilled OT services in home health setting to continue to advance functional skills in the area of iADL.  Equipment: TTB + 3:1  Reasons for discharge: treatment goals met and discharge from hospital  Patient/family agrees with progress made and goals achieved: Yes  OT Discharge Precautions/Restrictions  Precautions Precautions: Fall Restrictions Weight Bearing Restrictions: Yes LLE Weight Bearing: Touchdown weight bearing Pain Pain Assessment Pain Scale: 0-10 Pain Score: 2  Pain Type: Acute pain Pain Location: Leg Pain Orientation: Left Pain Descriptors / Indicators: Aching;Throbbing Pain Onset: On-going Patients Stated Pain Goal: 3 Pain Intervention(s): Medication (See eMAR) ADL ADL Eating: Independent Grooming: Independent Upper Body Bathing: Modified independent Lower Body Bathing: Modified independent Upper Body Dressing: Modified independent (Device) Lower Body Dressing: Modified independent Toileting: Modified independent Toilet Transfer: Modified independent Tub/Shower Transfer: Modified independent Perception  Perception: Within Functional Limits Praxis Praxis: Intact Cognition Overall Cognitive Status: Within Functional Limits for tasks assessed Arousal/Alertness: Awake/alert Orientation Level: Oriented X4 Memory: Appears intact Awareness: Appears intact Problem Solving: Appears intact Safety/Judgment: Appears intact Sensation Sensation Light Touch: Appears  Intact Proprioception: Appears Intact Coordination Gross Motor Movements are Fluid and Coordinated: Yes Fine Motor Movements are Fluid and Coordinated: Yes Mobility  Transfers Sit to Stand: Independent with assistive device Stand to Sit: Independent with assistive device  Balance Dynamic Standing Balance Dynamic Standing - Balance Support: During functional activity Dynamic Standing - Level of Assistance: 6: Modified independent (Device/Increase time) Extremity/Trunk Assessment RUE Assessment RUE Assessment: Within Functional Limits LUE Assessment LUE Assessment: Within Functional Limits   Daneen Schick Doe 05/27/2019, 11:12 AM

## 2019-05-27 NOTE — Progress Notes (Signed)
Physical Therapy Note  Patient Details  Name: [name] MRN: [id number] Date of Birth: April 20, 1950 Today's Date: 05/27/2019    Offered additional "bonus" session of therapy- patient initially agreeable but states she is getting a doppler. At that very moment, doppler tech arrived to work with patient. Unable to work with patient due to her being unavailable secondary to imaging.   Windell Norfolk, DPT, PN1   Supplemental Physical Therapist [facility]    Pager [phone] Acute Rehab Office [phone]     05/27/2019, 3:06 PM

## 2019-05-27 NOTE — Progress Notes (Signed)
Physical Therapy Discharge Summary  Patient Details  Name: [name] MRN: [id number] Date of Birth: 03-24-1950  Today's Date: 05/29/2019  Individual therapy: 1101-1142 (41 min) Focused session on preparation for d/c today. Pt performed all transfers during session at modified independent level with RW maintaining TDWB status including basic transfers, bed transfers, furniture transfers, and toileting. Pt able to navigate uneven surface with supervision for safety due to TDWB status with RW. Pt denies any concerns in regards to d/c and reviewed HEP, energy conservation techniques, and overall fall risk due to WB status. Pt verbalized understanding and agreement. Pt performed gait on unit with RW up to distances of 150' at modified independent level including navigating in home environment in room and ADL apartment. Stair negotiation training to simulate home access with bilateral rails with close supervision and good demonstration of technique. Reviewed exercises and importance of continued HEP as well as stretching for L anterior hip due to restrictions and to maintain ROM. Pt performed LAQ, seated and standing marches, standing hip extension and abduction, and verbalized hip adduction/abduction exercises. PT added foam to handles on RW for comfort in hands per pt request and adjusted RW that pt is to d/c with for proper fit (used this one during the session). Returned to room at end of session.   Patient has met 9 of 9 long term goals due to improved activity tolerance, improved balance, improved postural control, increased strength, decreased pain and ability to compensate for deficits.  Patient to discharge at an ambulatory level mod I gait with RW, supervision stairs with B rails.     Patient's family did not come for family education. Pt is modified independent and can direct care independently. She is able to verbalize need for supervision for stair negotiation.   Reasons goals not met: n/a  - goals met.  Recommendation:  Patient will benefit from ongoing skilled PT services in home health setting to continue to advance safe functional mobility, address ongoing impairments in LLE strengthening, endurance, stair negotiation, and minimize fall risk.  Equipment: RW  Reasons for discharge: treatment goals met and discharge from hospital  Patient/family agrees with progress made and goals achieved: Yes  PT Discharge Precautions/Restrictions Precautions Precautions: Fall Restrictions Weight Bearing Restrictions: Yes LLE Weight Bearing: Touchdown weight bearing  Vision/Perception  Pt wears glasses for reading only at baseline. No changes in baseline vision. Perception Perception: Within Functional Limits Praxis Praxis: Intact   Cognition Overall Cognitive Status: Within Functional Limits for tasks assessed Arousal/Alertness: Awake/alert Orientation Level: Oriented X4 Memory: Appears intact Awareness: Appears intact Problem Solving: Appears intact Safety/Judgment: Appears intact  Sensation Sensation Light Touch: Appears Intact Proprioception: Appears Intact Coordination Gross Motor Movements are Fluid and Coordinated: Yes Fine Motor Movements are Fluid and Coordinated: Yes  Motor  Motor Motor - Discharge Observations: LLE weakness, decreased endurance 2/2 LLE TDWB   Mobility Transfers Transfers: Sit to Stand;Stand to Sit;Stand Pivot Transfers Sit to Stand: Independent with assistive device Stand to Sit: Independent with assistive device  Locomotion  Gait Ambulation: Yes Gait Assistance: Independent with assistive device Gait Distance (Feet): 150 Feet Assistive device: Rolling walker Gait Gait: Yes Gait Pattern: Impaired Gait Pattern: Decreased stride length;Decreased step length - left;Decreased stance time - left;Decreased hip/knee flexion - left;Decreased dorsiflexion - left Gait velocity: decreased Stairs / Additional Locomotion Stairs:  Yes Stairs Assistance: Supervision/Verbal cueing Stair Management Technique: Two rails Number of Stairs: 4 Height of Stairs: 6(inches) Ramp: Contact Guard/touching assist(ambulatory with RW) Product manager Mobility:  Yes Wheelchair Assistance: Independent with assistive device Wheelchair Propulsion: Both upper extremities Wheelchair Parts Management: Needs assistance Distance: 150 ft   Trunk/Postural Assessment  Postural Control Postural Control: Deficits on evaluation Protective Responses: delayed 2/2 TDWB LLE   Balance Dynamic Standing Balance Dynamic Standing - Balance Support: During functional activity with RW Dynamic Standing - Level of Assistance: 6: Modified independent (Device/Increase time)   Extremity Assessment  RUE Assessment RUE Assessment: Within Functional Limits LUE Assessment LUE Assessment: Within Functional Limits  RLE Assessment RLE Assessment: Within Functional Limits LLE Assessment Passive Range of Motion (PROM) Comments: WFL General Strength Comments: no overpressure applied 2/2 pain with movement, 3/5 hip flexion & knee extension assessed in sitting   Victoria Miller, PT, DPT 05/27/2019, 1:45 PM  Alison B. Gray, PT, DPT, CBIS 05/29/19 12:09 PM   

## 2019-05-27 NOTE — Progress Notes (Signed)
Occupational Therapy Session Note  Patient Details  Name: [name] MRN: [id number] Date of Birth: [birth year]  Today's Date: 05/27/2019 OT Individual Time: 1102-1200 OT Individual Time Calculation (min): 58 min   Short Term Goals: Week 1:  OT Short Term Goal 1 (Week 1): LTG=STG 2/2 ELOS  Skilled Therapeutic Interventions/Progress Updates:    Pt greeted seated in wc and agreeable to OT treatment session. Pt declined to shower this mornring. Worked on community re-integration w/ wc propulsion to elevators with education provided for backing wc in and out of Media planner. OT provided pt with RW bag and educated on uses. Pt ambulated around gift shop while OT educated on safety awareness and ways to modify activities. Pt took rest break, then able to ambulate the rest of the way to the elevators and access elevators with RW and supervision. Pt brought to therapy gym and completed UB there-ex 3 sets of 10-bicep curls, chest press, and straight arm raises using 4lb dowel rod. Pt returned to room and ambulated into bathroom for toileting mod I. OT did have to provide cues for RW positioning at the sink when washing hands. Pt returned to wc and was given ice pack for pain management. Chair alarm on and call bell in reach.  Therapy Documentation Precautions:  Precautions Precautions: Fall Restrictions Weight Bearing Restrictions: Yes LLE Weight Bearing: Touchdown weight bearing Pain: Pain Assessment Pain Scale: 0-10 Pain Score: 2  Pain Type: Acute pain Pain Location: Leg Pain Orientation: Left Pain Descriptors / Indicators: Aching;Throbbing Pain Onset: On-going Patients Stated Pain Goal: 3 Pain Intervention(s): Medication (See eMAR)   Therapy/Group: Individual Therapy  Valma Cava 05/27/2019, 12:02 PM

## 2019-05-27 NOTE — Discharge Instructions (Signed)
Inpatient Rehab Discharge Instructions  [name] Discharge date and time: 05/29/19    Activities/Precautions/ Functional Status: Activity: no lifting, driving, or strenuous exercise till cleared by MD Diet: low fat, low cholesterol diet Wound Care: none needed    Functional status:  ___ No restrictions     ___ Walk up steps independently _X__ 24/7 supervision/assistance   ___ Walk up steps with assistance ___ Intermittent supervision/assistance  ___ Bathe/dress independently ___ Walk with walker     ___ Bathe/dress with assistance ___ Walk Independently    ___ Shower independently ___ Walk with assistance    _X__ Shower with assistance _X__ No alcohol     ___ Return to work/school ________   Special Instructions: 1. Touch down weight on Left leg. Use walker. Needs supervision when walking.    COMMUNITY REFERRALS UPON DISCHARGE:    Home Health:   PT     OT                 Agency: [facility] Phone: [phone] *Please expect follow-up within 2-3 days of discharge to schedule your home visit. If you have not received follow-up, be sure to contact the agency directly.    Medical Equipment/Items Ordered: 3in1 bedside commode, rolling walker, tub transfer bench                                                 Agency/Supplier: [facility] [phone]    My questions have been answered and I understand these instructions. I will adhere to these goals and the provided educational materials after my discharge from the hospital.  Patient/Caregiver Signature _______________________________ Date __________  Clinician Signature _______________________________________ Date __________  Please bring this form and your medication list with you to all your follow-up doctor's appointments.

## 2019-05-27 NOTE — Progress Notes (Signed)
[facility] PHYSICAL MEDICINE & REHABILITATION PROGRESS NOTE   Subjective/Complaints: Still with some discomfort along left hip/lateral leg. Otherwise feeling ok.  ROS: Patient denies fever, rash, sore throat, blurred vision, nausea, vomiting, diarrhea, cough, shortness of breath or chest pain, joint or back pain, headache, or mood change.    Objective:   No results found. Recent Labs    05/25/19 0515  WBC 7.6  HGB 11.1*  HCT 35.3*  PLT 253   Recent Labs    05/25/19 0515  NA 141  K 4.2  CL 102  CO2 28  GLUCOSE 111*  BUN 22  CREATININE 1.04*  CALCIUM 8.8*    Intake/Output Summary (Last 24 hours) at 05/27/2019 1039 Last data filed at 05/27/2019 0800 Gross per 24 hour  Intake 1238 ml  Output --  Net 1238 ml     Physical Exam: Vital Signs Blood pressure (!) 159/81, pulse 72, temperature 97.8 F (36.6 C), temperature source Oral, resp. rate 16, height 5\' 7"  (1.702 m), weight 81.9 kg, SpO2 100 %. Constitutional: No distress . Vital signs reviewed. HEENT: EOMI, oral membranes moist Neck: supple Cardiovascular: RRR without murmur. No JVD    Respiratory/Chest: CTA Bilaterally without wheezes or rales. Normal effort    GI/Abdomen: BS +, non-tender, non-distended Ext: no clubbing, cyanosis, or edema Skin: Clean and intact without signs of breakdown Neuro: alert, normal insight and awareness.. Cranial nerves 2-12 are intact. Sensory exam is normal except for along dorsal/planta aspects of left great toe--no change. Reflexes are 2+ in all 4's. Fine motor coordination is intact. No tremors. Motor function is grossly 5/5 with the exception of left lower extremity which is limited by pain.  --no motor changes Psych: very pleasant Musc: inguinal/perineal tenderness, pain along lateral hip      Assessment/Plan: 1. Functional deficits secondary to pelvic fractures which require 3+ hours per day of interdisciplinary therapy in a comprehensive inpatient rehab  setting.  Physiatrist is providing close team supervision and 24 hour management of active medical problems listed below.  Physiatrist and rehab team continue to assess barriers to discharge/monitor patient progress toward functional and medical goals  Care Tool:  Bathing    Body parts bathed by patient: Front perineal area, Right arm, Left arm, Chest, Abdomen, Buttocks, Right upper leg, Left upper leg, Right lower leg, Left lower leg, Face         Bathing assist Assist Level: Supervision/Verbal cueing     Upper Body Dressing/Undressing Upper body dressing   What is the patient wearing?: Pull over shirt, Bra    Upper body assist Assist Level: Supervision/Verbal cueing    Lower Body Dressing/Undressing Lower body dressing      What is the patient wearing?: Underwear/pull up, Pants     Lower body assist Assist for lower body dressing: Supervision/Verbal cueing     Toileting Toileting    Toileting assist Assist for toileting: Supervision/Verbal cueing     Transfers Chair/bed transfer  Transfers assist     Chair/bed transfer assist level: Supervision/Verbal cueing     Locomotion Ambulation   Ambulation assist      Assist level: Supervision/Verbal cueing Assistive device: Walker-rolling Max distance: 150 ft   Walk 10 feet activity   Assist     Assist level: Supervision/Verbal cueing Assistive device: Walker-rolling   Walk 50 feet activity   Assist    Assist level: Supervision/Verbal cueing Assistive device: Walker-rolling    Walk 150 feet activity   Assist    Assist level: Supervision/Verbal  cueing Assistive device: Walker-rolling    Walk 10 feet on uneven surface  activity   Assist     Assist level: Contact Guard/Touching assist Assistive device: Aeronautical engineer Will patient use wheelchair at discharge?: No Type of Wheelchair: Manual    Wheelchair assist level: Supervision/Verbal cueing Max  wheelchair distance: 150 ft    Wheelchair 50 feet with 2 turns activity    Assist        Assist Level: Supervision/Verbal cueing   Wheelchair 150 feet activity     Assist      Assist Level: Supervision/Verbal cueing   Blood pressure (!) 159/81, pulse 72, temperature 97.8 F (36.6 C), temperature source Oral, resp. rate 16, height 5\' 7"  (1.702 m), weight 81.9 kg, SpO2 100 %.  Medical Problem List and Plan: 1. 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    -will need outpt follow up, sees Dr. Letta Pate for chronic pain mgt as outpt  2.  Antithrombotics: -DVT/anticoagulation:  Pharmaceutical: Lovenox, dopplers pending today             -antiplatelet therapy: Plavix 3. Pain Management: ON tylenol 1000 mg qid-->decrease due to age, baclofen 5 mg tid, gabapentin 600 mg bid/900 mg hs, oxycodone 10 mg qid, Oxycontin 10mg  BID, Ice prn             3/16-18 pain is controlled. Not using oxycodone around the clock.    -  oxy ir prior to am activities.  4. Mood: LCSW to follow for evaluation and support             -antipsychotic agents: N/A 5. Neuropsych: This patient is capable of making decisions on her own behalf. 6. Skin/Wound Care: Routine pressure relief measures.  7. Fluids/Electrolytes/Nutrition: Monitor I/O. Check lytes in am.  8. HTN: Monitor BP tid--continue Lasix. Has been elevated. Added Amlodipine 2.5mg  daily.   -bp generally improved 9. H/o migraines: On Zoloft, Zonegran and inderal.  --appear controlled at present 10. Leucocytosis: Monitor for signs of infection.  3/16 wbc's down to 8.3  -low  grade temp---no clinical signs of infection   -encourage IS OOB   -check venous dopplers as above 11. Acute renal failure/Hyponateremia: BUN/SCr 20/1.10 at admission-->35/1.68.Has trended up with addition of Toradol which we d'ced  -3/16 bun/cr WNL   12. Constipation: On Senna and miralax.   -moving bowels daily    LOS: 4 days A FACE TO FACE EVALUATION WAS PERFORMED  Meredith Staggers 05/27/2019, 10:39 AM

## 2019-05-27 NOTE — Progress Notes (Signed)
Physical Therapy Session Note  Patient Details  Name: [name] MRN: [id number] Date of Birth: 05-22-1950  Today's Date: 05/27/2019 PT Individual Time: 0900-1015 PT Individual Time Calculation (min): 75 min   Short Term Goals: Week 1:  PT Short Term Goal 1 (Week 1): STG = LTG due to short ELOS. Week 2:    Week 3:     Skilled Therapeutic Interventions/Progress Updates:    PAIN  Denies pain, states she had pain meds 30 min ago.  Pt initially OOB in recliner and agreeable to treatment session with focus on  Functional mobility and training w/HEP.   STS and SPT w/cga using RW, maintains TDWB w/cues  wc propulsion 145ft mod I to gym w/bilat UEs. Gait 25 ft w/RW using wbing shoe, maintains TDWBing well. Tendency to trail w/LLE. Transitions to stairs directly via gait. Stairs:  Ascended/descended 4 stairs w/2 rails maintaining TDWBing w/cues for positioning of limb only, cga.  HEP:  Pt instructed w/and performed the following therex and received printed HEP including each  Access Code: [facility] URL: [email or web address] Date: 05/27/2019 Prepared by: Callie Fielding  Exercises Seated Long Arc Quad - 1 x daily - 7 x weekly - 3 sets - 10 reps Seated Hip External Rotation AROM - 1 x daily - 7 x weekly - 3 sets - 10 reps Standing March with Counter Support - 1 x daily - 7 x weekly - 3 sets - 10 reps Standing Hip Extension with Counter Support - 1 x daily - 7 x weekly - 3 sets - 10 reps Bent Knee Fallouts - 1 x daily - 7 x weekly - 3 sets - 10 reps Supine Hip Abduction AROM - 1 x daily - 7 x weekly - 3 sets - 10 reps  Gait:   60FT W/rw tdwbing LLE, tends to allow LLE to trail behind at times tending to drag toe creating safety hazard.  Instructed w/progressing RLE ahead sequencing w/walker advancement while maintaining  TDWBing on LLE.  Repeated gait x 24ft, maintains TDWB x 43ft but incidence of exceeding TDWB increases w/fatigue, returned to sitting.    Car  transfer:  Performed using RW w/cga, min cuing for TDWB status.  102ft on uneven surface:  Using RW w/cga, cues for positioning of LLE, cues to maintain TDwBing.  Pt transported to room at end of session.  SPT to recliner using rw w/cga and cues for TDWBing/positioining of LLE.  Therapy Documentation Precautions:  Precautions Precautions: Fall Restrictions Weight Bearing Restrictions: Yes LLE Weight Bearing: Touchdown weight bearing   Therapy/Group: Individual Therapy  Callie Fielding, [place] 05/27/2019, 12:29 PM

## 2019-05-27 NOTE — Progress Notes (Signed)
Social Work Patient ID: [name], female   DOB: May 11, 1950, 69 y.o.   MRN: [id number]   SW faxed HHPT/OT order to [facility] [phone].  SW faxed DME order: RW, 3in1 BSC and TTB to [facility] (p:[phone]/f:[phone]).  *Pt to have TTB delivered to home as she will speak with her husband about co-pays.   Items delivered and placed in pt room.   [name], MSW, [facility] Office: [phone] Cell: [phone] Fax: [phone]

## 2019-05-27 NOTE — Discharge Summary (Signed)
Physician Discharge Summary  Patient ID: [name] MRN: [id number] DOB/AGE: 1951/02/20 69 y.o.  Admit date: 05/23/2019 Discharge date: 05/29/2019  Discharge Diagnoses:  Principal Problem:   Multiple pelvic fractures [facility]) Active Problems:   Myofascial muscle pain   Essential hypertension   Osteoarthritis of knee   Discharged Condition: stable   Significant Diagnostic Studies: N/A   Labs:  Basic Metabolic Panel: Recent Labs  Lab 05/25/19 0515  NA 141  K 4.2  CL 102  CO2 28  GLUCOSE 111*  BUN 22  CREATININE 1.04*  CALCIUM 8.8*    CBC: Recent Labs  Lab 05/25/19 0515  WBC 7.6  HGB 11.1*  HCT 35.3*  MCV 87.2  PLT 253    CBG: No results for input(s): GLUCAP in the last 168 hours.  Brief HPI:   [name] is a 69 y.o. female with history of normocytic anemia, TIAs, HTN, cervical neuritis, migraines a fall 2 weeks 5 admission and pelvic fractures and poor pain control.  She was admitted on 03/06 at 22 with inability to ambulate and difficulty being managed at home.  CT of head was negative for acute changes.  CT pelvic and lumbar spine done for work-up and showed chronic compression fracture of L1 with less than 50% deformity and minimally displaced fracture of left pubis, left acetabular and left sacrum with extension to left SI joint.  Ortho was consulted for input and recommended TTWB RLE.  Gabapentin was added and titrated upwards for pain management.  IV Dilaudid was weaned off and multiple narcotics were added and titrated for better pain control.  Therapy was initiated and patient was noted to have functional deficits and mobility and ADLs.  CIR was recommended for follow-up therapy   Hospital Course: [name] was admitted to rehab 05/23/2019 for inpatient therapies to consist of PT and OT at least three hours five days a week. Past admission physiatrist, therapy team and rehab RN have worked together to provide customized collaborative inpatient  rehab.  She was maintained on subcu Lovenox for DVT prophylaxis.  Toradol was discontinued due to acute renal failure and follow-up labs shows renal status now within normal limits.  Serial CBC shows H&H is relatively stable.  Her p.o. intake has been good and she is continent of bowel and bladder.  She is tolerating current dose gabapentin without side effects.    She continues to require OxyContin twice daily with oxycodone and tramadol on as needed basis as well as baclofen 3 times daily.  She was instructed on taper of narcotics post  Discharge.   Bowel program was adjusted with good results.  Pain control has improved with increase in activity tolerance and mobility. Reactive leukocytosis has resolved but she continues to have low-grade fevers without signs of infection.  She was encouraged to use incentive spirometer and BLE Dopplers done prior to discharge and were negative.  Her blood pressures were monitored on TID basis and and were noted to be poorly controlled therefore low-dose amlodipine was added with improvement overall.  She has made gradual progress during her stay and is currently at supervision level.  She will continue to receive further follow-up home health PT and OT by [facility] after discharge.    Rehab course: During patient's stay in rehab weekly team conference was held to discuss  patient's progress, set goals and discuss barriers to discharge. At admission, patient required contact-guard assist for mobility assist with basic self-care task.  She  has had improvement in activity tolerance, balance, postural control as well as ability to compensate for deficits. She is able to complete ADL tasks at modified independent level.  She is modified independent for transfers and is able to ambulate 150' with RW. She requires supervision to climb stairs. Family education was completed regarding all aspects of care and safety.   Disposition: Home  Diet: Low fat/Low cholesterol.    Special Instructions: 1. Continue TDWB on LLE. Use walker for mobility.    Discharge Instructions    Ambulatory referral to Physical Medicine Rehab   Complete by: As directed    1-2 weeks TC appointment     Allergies as of 05/29/2019      Reactions   Fish Allergy Anaphylaxis   Iodinated Diagnostic Agents Shortness Of Breath, Itching   States she had this reaction connected with a MRI of the cervical spine.   Shellfish Allergy Anaphylaxis, Other (See Comments)   All seafood   Shellfish Allergy Anaphylaxis   Statins Other (See Comments)   Arthralgias (severe) with atorvastatin, mild arthralgias with rosuvastatin but willing to continue taking it      Medication List    STOP taking these medications   alum & mag hydroxide-simeth 200-200-20 MG/5ML suspension Commonly known as: MAALOX/MYLANTA   doxepin 100 MG capsule Commonly known as: SINEQUAN   furosemide 40 MG tablet Commonly known as: LASIX   ondansetron 4 MG tablet Commonly known as: ZOFRAN   oxyCODONE-acetaminophen 5-325 MG tablet Commonly known as: PERCOCET/ROXICET   ramipril 2.5 MG capsule Commonly known as: ALTACE   tiZANidine 2 MG tablet Commonly known as: ZANAFLEX     TAKE these medications   acetaminophen 325 MG tablet Commonly known as: TYLENOL Take 2 tablets (650 mg total) by mouth 4 (four) times daily -  with meals and at bedtime.   amLODipine 2.5 MG tablet Commonly known as: NORVASC Take 1 tablet (2.5 mg total) by mouth daily.   Baclofen 5 MG Tabs Take 5 mg by mouth 3 (three) times daily. Notes to patient: For muscle pain/spasms.    calcium carbonate 1250 (500 Ca) MG tablet Commonly known as: OS-CAL - dosed in mg of elemental calcium Take 1 tablet (500 mg of elemental calcium total) by mouth daily with breakfast.   cetirizine 10 MG tablet Commonly known as: ZYRTEC Take 10 mg by mouth daily with breakfast.   clopidogrel 75 MG tablet Commonly known as: PLAVIX Take 1 tablet (75 mg  total) by mouth daily. What changed: Another medication with the same name was removed. Continue taking this medication, and follow the directions you see here.   diclofenac Sodium 1 % Gel Commonly known as: VOLTAREN Apply 2 g topically 4 (four) times daily. To left hip   ELDERBERRY PO Take 1 tablet by mouth daily with breakfast.   Evolocumab 140 MG/ML Soaj Commonly known as: Repatha SureClick Inject [id number] mg into the skin every 14 (fourteen) days.   ezetimibe 10 MG tablet Commonly known as: ZETIA Take 10 mg by mouth daily.   fluticasone 50 MCG/ACT nasal spray Commonly known as: FLONASE Place 1 spray into both nostrils daily.   gabapentin 300 MG capsule Commonly known as: NEURONTIN Take 2 pills twice a day at 8 am/4 pm and 3 pills at bedtime What changed:   how much to take  how to take this  when to take this  additional instructions  Another medication with the same name was removed. Continue taking this medication, and follow the  directions you see here.   Hair/Skin/Nails/Biotin Tabs Take 2 tablets by mouth daily with breakfast.   lidocaine 5 % Commonly known as: LIDODERM Place 2 patches onto the skin daily. Apply to 2 patches to affected area in the AM, then remove after 12 hours   Nurtec 75 MG Tbdp Generic drug: Rimegepant Sulfate Take 75 mg by mouth daily as needed (take for abortive therapy of migraine, no more than 1 tablet in 24 hours or 10 per month).   oxyCODONE 10 mg 12 hr tablet--Rx #10 pills Commonly known as: OXYCONTIN Take 1 tablet (10 mg total) by mouth every 12 (twelve) hours. What changed: Another medication with the same name was changed. Make sure you understand how and when to take each. Notes to patient: Long acting narcotic--Starting on Wednesday decrease this to one pill daily till gone.    oxyCODONE 5 MG immediate release tablet--Rx# 40 pills Commonly known as: Oxy IR/ROXICODONE Take 1-2 tablets (5-10 mg total) by mouth every 6 (six)  hours as needed for severe pain. What changed:   how much to take  reasons to take this Notes to patient: Decrease to one pill four times a day as needed and continue to wean every 3-4 days to off   pantoprazole 40 MG tablet Commonly known as: PROTONIX Take 1 tablet (40 mg total) by mouth daily with breakfast. What changed: Another medication with the same name was removed. Continue taking this medication, and follow the directions you see here.   polyethylene glycol 17 g packet Commonly known as: MIRALAX / GLYCOLAX Take 17 g by mouth daily as needed. What changed:   when to take this  reasons to take this   propranolol 10 MG tablet Commonly known as: INDERAL TAKE 1 TABLET(10 MG) BY MOUTH TWICE DAILY What changed: Another medication with the same name was removed. Continue taking this medication, and follow the directions you see here.   senna 8.6 MG Tabs tablet Commonly known as: SENOKOT Take 1 tablet (8.6 mg total) by mouth daily.   sertraline 100 MG tablet Commonly known as: ZOLOFT Take 1 tablet (100 mg total) by mouth daily. What changed:   medication strength  See the new instructions.  Another medication with the same name was removed. Continue taking this medication, and follow the directions you see here.   traMADol 50 MG tablet--Rx # 20 pills Commonly known as: ULTRAM Take 1 tablet (50 mg total) by mouth 4 (four) times daily as needed for moderate pain.   Vitamin D (Ergocalciferol) 1.25 MG (50000 UNIT) Caps capsule Commonly known as: DRISDOL Take 1 capsule (50,000 Units total) by mouth every 7 (seven) days. Notes to patient: Once a week on Sundays   zonisamide 100 MG capsule Commonly known as: ZONEGRAN TAKE 1 CAPSULE(100 MG) BY MOUTH TWICE DAILY What changed: Another medication with the same name was removed. Continue taking this medication, and follow the directions you see here.   zonisamide 25 MG capsule Commonly known as: ZONEGRAN take 25 mg  twice daily prn in addition to 100 mg twice daily. What changed: Another medication with the same name was removed. Continue taking this medication, and follow the directions you see here.      Follow-up Information    Ouida Sills, Chelsey L, DO. Call on 05/30/2019.   Specialty: Family Medicine Why: For post hospital follow up Contact information: [id number] [place] [address] [phone]        Gaynelle Arabian, MD. Call on 05/30/2019.  Specialty: Orthopedic Surgery Why: for follow up on hip Contact information: [address] [place] [address] [id number]        Charlett Blake, MD Follow up.   Specialty: Physical Medicine and Rehabilitation Why: Office will call you with follow up appointment Contact information: [place] [address] [phone]           Signed: Bary Leriche 05/31/2019, 1:20 PM

## 2019-05-27 NOTE — Progress Notes (Signed)
Lower extremity venous duplex completed. Refer to "CV Proc" under chart review to view preliminary results.  05/27/2019 3:19 PM Kelby Aline., MHA, RVT, RDCS, RDMS

## 2019-05-27 NOTE — Plan of Care (Signed)
  Problem: Education: Goal: Verbalization of understanding the information provided (i.e., activity precautions, restrictions, etc) will improve Outcome: Progressing Goal: Individualized Educational Video(s) Outcome: Progressing   Problem: Activity: Goal: Ability to ambulate and perform ADLs will improve Description: Patient will be able to ambulate with mod I assist Outcome: Progressing   Problem: Clinical Measurements: Goal: Postoperative complications will be avoided or minimized Outcome: Progressing   Problem: Self-Concept: Goal: Ability to maintain and perform role responsibilities to the fullest extent possible will improve Description: Patient will be able to perform with mod I assist  Outcome: Progressing   Problem: Pain Management: Goal: Pain level will decrease Outcome: Progressing   Problem: Consults Goal: RH GENERAL PATIENT EDUCATION Description: See Patient Education module for education specifics. Outcome: Progressing Goal: Skin Care Protocol Initiated - if Braden Score 18 or less Description: If consults are not indicated, leave blank or document N/A Outcome: Progressing   Problem: RH BOWEL ELIMINATION Goal: RH STG MANAGE BOWEL WITH ASSISTANCE Description: STG Manage Bowel with min Assistance. Outcome: Progressing Goal: RH STG MANAGE BOWEL W/MEDICATION W/ASSISTANCE Description: STG Manage Bowel with Medication with min Assistance. Outcome: Progressing   Problem: RH BLADDER ELIMINATION Goal: RH STG MANAGE BLADDER WITH ASSISTANCE Description: STG Manage Bladder With min Assistance Outcome: Progressing   Problem: RH SKIN INTEGRITY Goal: RH STG SKIN FREE OF INFECTION/BREAKDOWN Outcome: Progressing Goal: RH STG MAINTAIN SKIN INTEGRITY WITH ASSISTANCE Description: STG Maintain Skin Integrity With min Assistance. Outcome: Progressing Goal: RH STG ABLE TO PERFORM INCISION/WOUND CARE W/ASSISTANCE Description: STG Able To Perform Incision/Wound Care With min  Assistance. Outcome: Progressing   Problem: RH SAFETY Goal: RH STG ADHERE TO SAFETY PRECAUTIONS W/ASSISTANCE/DEVICE Description: STG Adhere to Safety Precautions With min Assistance/Device. Outcome: Progressing Goal: RH STG DECREASED RISK OF FALL WITH ASSISTANCE Description: STG Decreased Risk of Fall With min Assistance. Outcome: Progressing   Problem: RH PAIN MANAGEMENT Goal: RH STG PAIN MANAGED AT OR BELOW PT'S PAIN GOAL Description: Pain < or = 4 Outcome: Progressing   Problem: RH KNOWLEDGE DEFICIT GENERAL Goal: RH STG INCREASE KNOWLEDGE OF SELF CARE AFTER HOSPITALIZATION Outcome: Progressing

## 2019-05-27 NOTE — Evaluation (Signed)
Recreational Therapy Assessment and Plan  Patient Details  Name: [name] MRN: [id number] Date of Birth: [birth year] Today's Date: 05/27/2019  Rehab Potential:  ELOS: d/c 3/21  Assessment  Problem List:      Patient Active Problem List   Diagnosis Date Noted  . Multiple pelvic fractures ([place]) 05/23/2019  . Muscular deconditioning   . Frequent falls   . Nondisplaced fracture of anterior wall of left acetabulum   . Fracture of multiple pubic rami, left 05/14/2019  . Chronic cough 01/05/2019  . Fatigue 11/04/2018  . Exposure to COVID-19 virus 10/05/2018  . Pre-operative clearance 04/21/2018  . PVD (peripheral vascular disease) ([place]) 04/20/2018  . CRI (chronic renal insufficiency), stage 3 (moderate) 04/20/2018  . Pyelonephritis 04/05/2018  . Complicated UTI (urinary tract infection) 04/04/2018  . Plantar fasciitis 01/10/2018  . Chronic anxiety 01/08/2018  . Gastroesophageal reflux disease 01/08/2018  . Menopausal syndrome 01/08/2018  . Pulmonary embolism ([place])   . Coronary artery calcification   . Dyspnea 07/26/2017  . Osteoarthritis of knee 04/08/2017  . Status post cholecystectomy   . Hyperlipidemia LDL goal <70 04/02/2015  . Chronic migraine without aura, with status migrainosus 09/26/2014  . Migraine variant 10/10/2013  . TIA (transient ischemic attack) 10/10/2013  . Essential hypertension 07/18/2013  . 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 Neuralgia, neuritis, and radiculitis, unspecified 02/04/2012  . Myofascial muscle pain 07/15/2011    Past Medical History:      Past Medical History:  Diagnosis Date  . Anemia   . Cervical neuritis   . Chronic kidney disease    stage 3  . Dyslipidemia   . GERD (gastroesophageal reflux disease)   . History of transient ischemic attack (TIA)   . HTN (hypertension)   . Hyperlipidemia   . Migraine   . Migraine headache   . Occipital neuralgia    Left  . Pulmonary embolism ([place]) 2017   . Renal insufficiency   . Stroke [facility]) 10/2013   Past Surgical History:       Past Surgical History:  Procedure Laterality Date  . ABDOMINAL HYSTERECTOMY    . BILIARY STENT PLACEMENT N/A 12/12/2015   Procedure: BILIARY STENT PLACEMENT;  Surgeon: Doran Stabler, MD;  Location: [facility] [facility];  Service: Endoscopy;  Laterality: N/A;  . BLADDER REPAIR    . CARPAL TUNNEL RELEASE    . CERVICAL FUSION    . CERVICAL SPINE SURGERY    . CHOLECYSTECTOMY N/A 12/06/2015   Procedure: LAPAROSCOPIC CHOLECYSTECTOMY WITH  INTRAOPERATIVE CHOLANGIOGRAM;  Surgeon: Stark Klein, MD;  Location: [place];  Service: General;  Laterality: N/A;  . ELBOW SURGERY    . ERCP N/A 12/12/2015   Procedure: ENDOSCOPIC RETROGRADE CHOLANGIOPANCREATOGRAPHY (ERCP);  Surgeon: Doran Stabler, MD;  Location: [facility] END[facility]COPY;  Service: Endoscopy;  Laterality: N/A;  . ESOPHAGOGASTRODUODENOSCOPY N/A 01/28/2016   Procedure: ESOPHAGOGASTRODUODENOSCOPY (EGD) Biliary STENT removal;  Surgeon: Doran Stabler, MD;  Location: [facility];  Service: Gastroenterology;  Laterality: N/A;  . IR GENERIC HISTORICAL  12/17/2015   IR US GUIDE BX ASP/DRAIN 12/17/2015 [facility]  . IR GENERIC HISTORICAL  12/17/2015   IR GUIDED DRAIN W CATHETER PLACEMENT 12/17/2015 [facility]  . IR GENERIC HISTORICAL  12/17/2015   IR SINUS/FIST TUBE CHK-NON GI 12/17/2015 [facility]  . KNEE SURGERY    . LUNG SURGERY    . TEE WITHOUT CARDIOVERSION N/A 12/19/2015   Procedure: TRANSESOPHAGEAL ECHOCARDIOGRAM (TEE);  Surgeon: Josue Hector, MD;  Location: [place];  Service: Cardiovascular;  Laterality: N/A;    Assessment & Plan Clinical Impression: Patient is a 69 y.o. year old female with recent admission to the hospital on 05/14/19 for pain control, inability to ambulate and difficulty being managed at home. CT head was negative for acute changes and CT spine showed solid fusion C4/5 C5/6 and C6/7. She continued to have issues  with pain control and mobility. CT lumbar and pelvic spine done for work up and showed chronic compression Fx L1 with <50% deformity and minimally displaced fracture left pubis, left acetabular and left sacrum with extension into left SI joint. Ortho consulted for input and recommended TTWB LLE. Gabapentin added and titrated upwards. IV dilaudid weaned to oxycodone by 3/12 and sheToradol IV added 3/11. Patient transferred to CIR on 05/23/2019 .    Met with pt today per team referral to discuss discharge planning, use of leisure time and relaxation strategies.  Pt describes herself as very active PTA with hopes of returning to previous activity level once recovered.  Education/discussion on importance of staying activity, activity analysis identifying modifications.  Discussed wellness domains to include social, emotional and spiritual health in addition to continued physical recovery and their impact on overall health and wellness.  Pt stated understanding.     Plan No further TR as pt is schedule for discharge 3/21.  Recommendations for other services: None   Discharge Criteria: Patient will be discharged from TR if patient refuses treatment 3 consecutive times without medical reason.  If treatment goals not met, if there is a change in medical status, if patient makes no progress towards goals or if patient is discharged from hospital.  The above assessment, treatment plan, treatment alternatives and goals were discussed and mutually agreed upon: by patient  [place] 05/27/2019, 12:35 PM

## 2019-05-27 NOTE — Progress Notes (Signed)
Physical Therapy Session Note  Patient Details  Name: [name] MRN: [id number] Date of Birth: December 26, 1950  Today's Date: 05/27/2019 PT Individual Time: [id number] PT Individual Time Calculation (min): 55 min   Short Term Goals: Week 1:  PT Short Term Goal 1 (Week 1): STG = LTG due to short ELOS.  Skilled Therapeutic Interventions/Progress Updates:  Pt received in recliner & agreeable to tx. Pt transfers sit<>stand with mod I and RW. Pt ambulates room>gym with RW & distant supervision with decreased gait speed. Pt negotiates 4 steps with B rails x 2 trials with seated rest break in between 2/2 fatigue. Pt requires close supervision<>light CGA for first trial, supervision for 2nd trial with instructional cuing not to hop but to use BUE to support body weight with improvement in 2nd trial. Pt is able to recall & demonstrate compensatory pattern without cuing. Pt propels w/c gym>dayroom, dayroom>room with BUE & mod I. Pt performs LLE long arc quads & seated hip flexion with 3# ankle weights, seated hamstring curls & hip abduction with orange theraband, all 2 sets x 20 reps for LLE strengthening & therapist providing instructional cuing for technique. Pt ambulates w/c>recliner with RW in same manner. Pt left in w/c with chair alarm donned, all needs in reach.  Pt denies questions/concerns re: d/c Sunday.  Therapy Documentation Precautions:  Precautions Precautions: Fall Restrictions Weight Bearing Restrictions: Yes LLE Weight Bearing: Touchdown weight bearing  Pain: Pt reports unrated soreness in L hip - rest breaks provided PRN & ice applied at beginning & end of session.   Therapy/Group: Individual Therapy  Waunita Schooner 05/27/2019, 2:21 PM

## 2019-05-28 ENCOUNTER — Inpatient Hospital Stay (HOSPITAL_COMMUNITY): Payer: PRIVATE HEALTH INSURANCE | Admitting: Occupational Therapy

## 2019-05-28 DIAGNOSIS — S32810S Multiple fractures of pelvis with stable disruption of pelvic ring, sequela: Secondary | ICD-10-CM

## 2019-05-28 NOTE — Progress Notes (Signed)
Occupational Therapy Session Note  Patient Details  Name: ARYAHI SNITKER MRN: [id number] Date of Birth: 1950-10-29  Today's Date: 05/28/2019 OT Individual Time: [phone] OT Individual Time Calculation (min): 70 min   Short Term Goals: Week 1:  OT Short Term Goal 1 (Week 1): LTG=STG 2/2 ELOS  Skilled Therapeutic Interventions/Progress Updates:    Pt greeted in the recliner, just finished breakfast and premedicated for pain. Agreeable to engage in BADL routine. Pt completed toileting, bathing at shower level, and dressing sit<stand from toilet using RW at Mod I level. Supervision/cuing for RW placement during shower transfer. Pt mindful of maintaining her TDWB precautions on the L LE. Worked on IADL retraining w/c level while she swept the floor using broom and LH dustpan. She then took a short walk around the unit using device, sitting in an armless chair and not needing assist for transfer out of chair. Pt already has an UE HEP from primary OT. We discussed DME for home and f/u with pt already having a good understanding. Left her in the recliner with all needs within reach.   Therapy Documentation Precautions:  Precautions Precautions: Fall Restrictions Weight Bearing Restrictions: Yes LLE Weight Bearing: Touchdown weight bearing Pain: pt premedicated, applied her Voltaren gel to L LE after shower  Pain Assessment Pain Scale: 0-10 Pain Score: 0-No pain Pain Type: Acute pain Pain Location: Leg Pain Orientation: Left Pain Descriptors / Indicators: Aching Pain Onset: On-going Patients Stated Pain Goal: 3 Pain Intervention(s): Medication (See eMAR) ADL: ADL Eating: Independent Grooming: Independent Upper Body Bathing: Modified independent Lower Body Bathing: Modified independent Upper Body Dressing: Modified independent (Device) Lower Body Dressing: Modified independent Toileting: Modified independent Toilet Transfer: Modified independent Tub/Shower Transfer: Modified  independent      Therapy/Group: Individual Therapy  Lundon Verdejo A Amera Banos 05/28/2019, 4:18 PM

## 2019-05-28 NOTE — Plan of Care (Signed)
  Problem: Education: Goal: Verbalization of understanding the information provided (i.e., activity precautions, restrictions, etc) will improve Outcome: Completed/Met Goal: Individualized Educational Video(s) Outcome: Completed/Met   Problem: Activity: Goal: Ability to ambulate and perform ADLs will improve Description: Patient will be able to ambulate with mod I assist Outcome: Completed/Met   Problem: Clinical Measurements: Goal: Postoperative complications will be avoided or minimized Outcome: Completed/Met   Problem: Self-Concept: Goal: Ability to maintain and perform role responsibilities to the fullest extent possible will improve Description: Patient will be able to perform with mod I assist  Outcome: Completed/Met   Problem: Pain Management: Goal: Pain level will decrease Outcome: Completed/Met   Problem: Consults Goal: RH GENERAL PATIENT EDUCATION Description: See Patient Education module for education specifics. Outcome: Completed/Met Goal: Skin Care Protocol Initiated - if Braden Score 18 or less Description: If consults are not indicated, leave blank or document N/A Outcome: Completed/Met   Problem: RH BOWEL ELIMINATION Goal: RH STG MANAGE BOWEL WITH ASSISTANCE Description: STG Manage Bowel with min Assistance. Outcome: Completed/Met Goal: RH STG MANAGE BOWEL W/MEDICATION W/ASSISTANCE Description: STG Manage Bowel with Medication with min Assistance. Outcome: Completed/Met   Problem: RH BLADDER ELIMINATION Goal: RH STG MANAGE BLADDER WITH ASSISTANCE Description: STG Manage Bladder With min Assistance Outcome: Completed/Met   Problem: RH SKIN INTEGRITY Goal: RH STG SKIN FREE OF INFECTION/BREAKDOWN Outcome: Completed/Met Goal: RH STG MAINTAIN SKIN INTEGRITY WITH ASSISTANCE Description: STG Maintain Skin Integrity With min Assistance. Outcome: Completed/Met Goal: RH STG ABLE TO PERFORM INCISION/WOUND CARE W/ASSISTANCE Description: STG Able To Perform  Incision/Wound Care With min Assistance. Outcome: Completed/Met   Problem: RH SAFETY Goal: RH STG ADHERE TO SAFETY PRECAUTIONS W/ASSISTANCE/DEVICE Description: STG Adhere to Safety Precautions With min Assistance/Device. Outcome: Completed/Met Goal: RH STG DECREASED RISK OF FALL WITH ASSISTANCE Description: STG Decreased Risk of Fall With min Assistance. Outcome: Completed/Met   Problem: RH PAIN MANAGEMENT Goal: RH STG PAIN MANAGED AT OR BELOW PT'S PAIN GOAL Description: Pain < or = 4 Outcome: Completed/Met   Problem: RH KNOWLEDGE DEFICIT GENERAL Goal: RH STG INCREASE KNOWLEDGE OF SELF CARE AFTER HOSPITALIZATION Outcome: Completed/Met

## 2019-05-28 NOTE — Progress Notes (Signed)
[name] PHYSICAL MEDICINE & REHABILITATION PROGRESS NOTE   Subjective/Complaints: Discussed lower extremity Doppler studies, patient is comfortable in terms of pain  ROS: Patient denies chest pain, shortness of breath, nausea vomiting diarrhea  Objective:   VAS [place] LOWER EXTREMITY VENOUS (DVT)  Result Date: 05/27/2019  Lower Venous DVTStudy Indications: Swelling.  Limitations: Poor ultrasound/tissue interface. Comparison Study: No prior study Performing Technologist: Maudry Mayhew MHA, RDMS, RVT, RDCS  Examination Guidelines: A complete evaluation includes B-mode imaging, spectral Doppler, color Doppler, and power Doppler as needed of all accessible portions of each vessel. Bilateral testing is considered an integral part of a complete examination. Limited examinations for reoccurring indications may be performed as noted. The reflux portion of the exam is performed with the patient in reverse Trendelenburg.  +---------+---------------+---------+-----------+----------+--------------+ RIGHT    CompressibilityPhasicitySpontaneityPropertiesThrombus Aging +---------+---------------+---------+-----------+----------+--------------+ CFV      Full           Yes      Yes                                 +---------+---------------+---------+-----------+----------+--------------+ SFJ      Full                                                        +---------+---------------+---------+-----------+----------+--------------+ FV Prox  Full                                                        +---------+---------------+---------+-----------+----------+--------------+ FV Mid   Full                                                        +---------+---------------+---------+-----------+----------+--------------+ FV DistalFull                                                        +---------+---------------+---------+-----------+----------+--------------+ PFV      Full                                                         +---------+---------------+---------+-----------+----------+--------------+ POP      Full           Yes      Yes                                 +---------+---------------+---------+-----------+----------+--------------+ PTV      Full                                                        +---------+---------------+---------+-----------+----------+--------------+  PERO     Full                                                        +---------+---------------+---------+-----------+----------+--------------+   +---------+---------------+---------+-----------+----------+--------------+ LEFT     CompressibilityPhasicitySpontaneityPropertiesThrombus Aging +---------+---------------+---------+-----------+----------+--------------+ CFV      Full           Yes      Yes                                 +---------+---------------+---------+-----------+----------+--------------+ SFJ      Full                                                        +---------+---------------+---------+-----------+----------+--------------+ FV Prox  Full                                                        +---------+---------------+---------+-----------+----------+--------------+ FV Mid   Full                                                        +---------+---------------+---------+-----------+----------+--------------+ FV DistalFull                                                        +---------+---------------+---------+-----------+----------+--------------+ PFV      Full                                                        +---------+---------------+---------+-----------+----------+--------------+ POP      Full           Yes      Yes                                 +---------+---------------+---------+-----------+----------+--------------+ PTV      Full                                                         +---------+---------------+---------+-----------+----------+--------------+ PERO     Full                                                        +---------+---------------+---------+-----------+----------+--------------+  Summary: RIGHT: - There is no evidence of deep vein thrombosis in the lower extremity.  - No cystic structure found in the popliteal fossa.  LEFT: - There is no evidence of deep vein thrombosis in the lower extremity.  - No cystic structure found in the popliteal fossa.  *See table(s) above for measurements and observations.    Preliminary    No results for input(s): WBC, HGB, HCT, PLT in the last 72 hours. No results for input(s): NA, K, CL, CO2, GLUCOSE, BUN, CREATININE, CALCIUM in the last 72 hours.  Intake/Output Summary (Last 24 hours) at 05/28/2019 1437 Last data filed at 05/28/2019 1343 Gross per 24 hour  Intake 950 ml  Output -  Net 950 ml     Physical Exam: Vital Signs Blood pressure (!) 179/75, pulse 78, temperature 98.2 F (36.8 C), temperature source Oral, resp. rate 18, height 5\' 7"  (1.702 m), weight 81.9 kg, SpO2 99 %. Constitutional: No distress . Vital signs reviewed.  General: No acute distress Mood and affect are appropriate Heart: Regular rate and rhythm no rubs murmurs or extra sounds Lungs: Clear to auscultation, breathing unlabored, no rales or wheezes Abdomen: Positive bowel sounds, soft nontender to palpation, nondistended Extremities: No clubbing, cyanosis, or edema Skin: No evidence of breakdown, no evidence of rash Neurologic: Cranial nerves II through XII intact, motor strength is 5/5 in bilateral deltoid, bicep, tricep, grip, right and 3 - left hip flexor, knee extensors, ankle dorsiflexor and plantar flexor Sensory exam normal sensation to light touch and proprioception in bilateral upper and lower extremities Cerebellar exam normal finger to nose to finger as well as heel to shin in bilateral upper and lower  extremities Musculoskeletal: Full range of motion in all 4 extremities. No joint swelling       Assessment/Plan: 1. Functional deficits secondary to pelvic fractures which require 3+ hours per day of interdisciplinary therapy in a comprehensive inpatient rehab setting.  Physiatrist is providing close team supervision and 24 hour management of active medical problems listed below.  Physiatrist and rehab team continue to assess barriers to discharge/monitor patient progress toward functional and medical goals  Care Tool:  Bathing    Body parts bathed by patient: Front perineal area, Right arm, Left arm, Chest, Abdomen, Buttocks, Right upper leg, Left upper leg, Right lower leg, Left lower leg, Face         Bathing assist Assist Level: Independent with assistive device     Upper Body Dressing/Undressing Upper body dressing   What is the patient wearing?: Pull over shirt, Bra    Upper body assist Assist Level: Independent    Lower Body Dressing/Undressing Lower body dressing      What is the patient wearing?: Underwear/pull up, Pants     Lower body assist Assist for lower body dressing: Independent with assitive device     Toileting Toileting    Toileting assist Assist for toileting: Independent with assistive device     Transfers Chair/bed transfer  Transfers assist     Chair/bed transfer assist level: Supervision/Verbal cueing     Locomotion Ambulation   Ambulation assist      Assist level: Supervision/Verbal cueing Assistive device: Walker-rolling Max distance: 150 ft   Walk 10 feet activity   Assist     Assist level: Supervision/Verbal cueing Assistive device: Walker-rolling   Walk 50 feet activity   Assist    Assist level: Supervision/Verbal cueing Assistive device: Walker-rolling    Walk 150 feet activity   Assist Walk  150 feet activity did not occur: Safety/medical concerns(unable to maintain TDWB for this distance  today)  Assist level: Supervision/Verbal cueing Assistive device: Walker-rolling    Walk 10 feet on uneven surface  activity   Assist     Assist level: Contact Guard/Touching assist Assistive device: Aeronautical engineer Will patient use wheelchair at discharge?: No Type of Wheelchair: Manual    Wheelchair assist level: Set up assist Max wheelchair distance: 100 ft    Wheelchair 50 feet with 2 turns activity    Assist        Assist Level: Set up assist   Wheelchair 150 feet activity     Assist      Assist Level: Supervision/Verbal cueing   Blood pressure (!) 179/75, pulse 78, temperature 98.2 F (36.8 C), temperature source Oral, resp. rate 18, height 5\' 7"  (1.702 m), weight 81.9 kg, SpO2 99 %.  Medical Problem List and Plan: 1. 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    Can see me in follow-up 2.  Antithrombotics: -DVT/anticoagulation:  Pharmaceutical: Lovenox, dopplers pending today             -antiplatelet therapy: Plavix 3. Pain Management: ON tylenol 1000 mg qid-->decrease due to age, baclofen 5 mg tid, gabapentin 600 mg bid/900 mg hs, oxycodone 10 mg qid, Oxycontin 10mg  BID, Ice prn             3/16-18 pain is controlled. Not using oxycodone around the clock.    -  oxy ir prior to am activities.  4. Mood: LCSW to follow for evaluation and support             -antipsychotic agents: N/A 5. Neuropsych: This patient is capable of making decisions on her own behalf. 6. Skin/Wound Care: Routine pressure relief measures.  7. Fluids/Electrolytes/Nutrition:  Monitor I/O. Check lytes in am.  8. HTN: Monitor BP tid--continue Lasix. Has been elevated. Added Amlodipine 2.5mg  daily.   -bp generally improved Vitals:   05/27/19 2031 05/28/19 0438  BP: 130/77 (!) 179/75  Pulse: 82 78  Resp: 17 18  Temp: 98.1 F (36.7 C) 98.2 F (36.8 C)  SpO2: 0000000 [id number]  Systolic hypertension, the patient has occasional spikes.  This may be related to timing of medications 9. H/o migraines: On Zoloft, Zonegran and inderal.  --appear controlled at present 10. Leucocytosis: Monitor for signs of infection.  3/16 wbc's down to 8.3  -low grade temp---no clinical signs of infection   -encourage IS OOB   -check venous dopplers as above 11. Acute renal failure/Hyponateremia: BUN/SCr 20/1.10 at admission-->35/1.68.Has trended up with addition of Toradol which we d'ced  -3/16 bun/cr WNL   12. Constipation: On Senna and miralax.   -moving bowels daily    LOS: 5 days A FACE TO FACE EVALUATION WAS PERFORMED  Charlett Blake 05/28/2019, 2:37 PM

## 2019-05-29 ENCOUNTER — Inpatient Hospital Stay (HOSPITAL_COMMUNITY): Payer: PRIVATE HEALTH INSURANCE

## 2019-05-29 ENCOUNTER — Inpatient Hospital Stay (HOSPITAL_COMMUNITY): Payer: PRIVATE HEALTH INSURANCE | Admitting: Occupational Therapy

## 2019-05-29 DIAGNOSIS — S32810D Multiple fractures of pelvis with stable disruption of pelvic ring, subsequent encounter for fracture with routine healing: Secondary | ICD-10-CM

## 2019-05-29 NOTE — Plan of Care (Signed)
  Problem: RH Balance Goal: LTG: Patient will maintain dynamic sitting balance (OT) Description: LTG:  Patient will maintain dynamic sitting balance with assistance during activities of daily living (OT) Outcome: Completed/Met Goal: LTG Patient will maintain dynamic standing with ADLs (OT) Description: LTG:  Patient will maintain dynamic standing balance with assist during activities of daily living (OT)  Outcome: Completed/Met   Problem: Sit to Stand Goal: LTG:  Patient will perform sit to stand in prep for activites of daily living with assistance level (OT) Description: LTG:  Patient will perform sit to stand in prep for activites of daily living with assistance level (OT) Outcome: Completed/Met   Problem: RH Bathing Goal: LTG Patient will bathe all body parts with assist levels (OT) Description: LTG: Patient will bathe all body parts with assist levels (OT) Outcome: Completed/Met   Problem: RH Dressing Goal: LTG Patient will perform upper body dressing (OT) Description: LTG Patient will perform upper body dressing with assist, with/without cues (OT). Outcome: Completed/Met Goal: LTG Patient will perform lower body dressing w/assist (OT) Description: LTG: Patient will perform lower body dressing with assist, with/without cues in positioning using equipment (OT) Outcome: Completed/Met   Problem: RH Toileting Goal: LTG Patient will perform toileting task (3/3 steps) with assistance level (OT) Description: LTG: Patient will perform toileting task (3/3 steps) with assistance level (OT)  Outcome: Completed/Met   Problem: RH Toilet Transfers Goal: LTG Patient will perform toilet transfers w/assist (OT) Description: LTG: Patient will perform toilet transfers with assist, with/without cues using equipment (OT) Outcome: Completed/Met   Problem: RH Tub/Shower Transfers Goal: LTG Patient will perform tub/shower transfers w/assist (OT) Description: LTG: Patient will perform tub/shower  transfers with assist, with/without cues using equipment (OT) Outcome: Completed/Met

## 2019-05-29 NOTE — Progress Notes (Addendum)
[facility] PHYSICAL MEDICINE & REHABILITATION PROGRESS NOTE   Subjective/Complaints: Took pain med thsi am no pain with LE movement  Discussed elevated BP in am   ROS: Patient denies chest pain, shortness of breath, nausea vomiting diarrhea  Objective:   VAS [place] LOWER EXTREMITY VENOUS (DVT)  Result Date: 05/27/2019  Lower Venous DVTStudy Indications: Swelling.  Limitations: Poor ultrasound/tissue interface. Comparison Study: No prior study Performing Technologist: Maudry Mayhew MHA, RDMS, RVT, RDCS  Examination Guidelines: A complete evaluation includes B-mode imaging, spectral Doppler, color Doppler, and power Doppler as needed of all accessible portions of each vessel. Bilateral testing is considered an integral part of a complete examination. Limited examinations for reoccurring indications may be performed as noted. The reflux portion of the exam is performed with the patient in reverse Trendelenburg.  +---------+---------------+---------+-----------+----------+--------------+ RIGHT    CompressibilityPhasicitySpontaneityPropertiesThrombus Aging +---------+---------------+---------+-----------+----------+--------------+ CFV      Full           Yes      Yes                                 +---------+---------------+---------+-----------+----------+--------------+ SFJ      Full                                                        +---------+---------------+---------+-----------+----------+--------------+ FV Prox  Full                                                        +---------+---------------+---------+-----------+----------+--------------+ FV Mid   Full                                                        +---------+---------------+---------+-----------+----------+--------------+ FV DistalFull                                                        +---------+---------------+---------+-----------+----------+--------------+ PFV      Full                                                         +---------+---------------+---------+-----------+----------+--------------+ POP      Full           Yes      Yes                                 +---------+---------------+---------+-----------+----------+--------------+ PTV      Full                                                        +---------+---------------+---------+-----------+----------+--------------+  PERO     Full                                                        +---------+---------------+---------+-----------+----------+--------------+   +---------+---------------+---------+-----------+----------+--------------+ LEFT     CompressibilityPhasicitySpontaneityPropertiesThrombus Aging +---------+---------------+---------+-----------+----------+--------------+ CFV      Full           Yes      Yes                                 +---------+---------------+---------+-----------+----------+--------------+ SFJ      Full                                                        +---------+---------------+---------+-----------+----------+--------------+ FV Prox  Full                                                        +---------+---------------+---------+-----------+----------+--------------+ FV Mid   Full                                                        +---------+---------------+---------+-----------+----------+--------------+ FV DistalFull                                                        +---------+---------------+---------+-----------+----------+--------------+ PFV      Full                                                        +---------+---------------+---------+-----------+----------+--------------+ POP      Full           Yes      Yes                                 +---------+---------------+---------+-----------+----------+--------------+ PTV      Full                                                         +---------+---------------+---------+-----------+----------+--------------+ PERO     Full                                                        +---------+---------------+---------+-----------+----------+--------------+  Summary: RIGHT: - There is no evidence of deep vein thrombosis in the lower extremity.  - No cystic structure found in the popliteal fossa.  LEFT: - There is no evidence of deep vein thrombosis in the lower extremity.  - No cystic structure found in the popliteal fossa.  *See table(s) above for measurements and observations.    Preliminary    No results for input(s): WBC, HGB, HCT, PLT in the last 72 hours. No results for input(s): NA, K, CL, CO2, GLUCOSE, BUN, CREATININE, CALCIUM in the last 72 hours.  Intake/Output Summary (Last 24 hours) at 05/29/2019 1016 Last data filed at 05/29/2019 0600 Gross per 24 hour  Intake 840 ml  Output --  Net 840 ml     Physical Exam: Vital Signs Blood pressure (!) 167/86, pulse 73, temperature 98.1 F (36.7 C), temperature source Oral, resp. rate 16, height 5\' 7"  (1.702 m), weight 81.9 kg, SpO2 100 %. Constitutional: No distress . Vital signs reviewed.   General: No acute distress Mood and affect are appropriate Heart: Regular rate and rhythm no rubs murmurs or extra sounds Lungs: Clear to auscultation, breathing unlabored, no rales or wheezes Abdomen: Positive bowel sounds, soft nontender to palpation, nondistended Extremities: No clubbing, cyanosis, or edema Skin: No evidence of breakdown, no evidence of rash Neurologic: , motor strength is 5/5 in bilateral deltoid, bicep, tricep, grip, hip flexor, knee extensors, ankle dorsiflexor and plantar flexor  Musculoskeletal: reduced left hip ROM . No joint swelling       Assessment/Plan: 1. Functional deficits secondary to pelvic fractures  Stable for D/C today F/u PCP in 3-4 weeks F/u PM&R 2 weeks F/u ortho 1-2 wk See D/C summary See D/C instructions Care  Tool:  Bathing    Body parts bathed by patient: Front perineal area, Right arm, Left arm, Chest, Abdomen, Buttocks, Right upper leg, Left upper leg, Right lower leg, Left lower leg, Face         Bathing assist Assist Level: Independent with assistive device     Upper Body Dressing/Undressing Upper body dressing   What is the patient wearing?: Pull over shirt, Bra    Upper body assist Assist Level: Independent    Lower Body Dressing/Undressing Lower body dressing      What is the patient wearing?: Underwear/pull up, Pants     Lower body assist Assist for lower body dressing: Independent with assitive device     Toileting Toileting    Toileting assist Assist for toileting: Independent with assistive device     Transfers Chair/bed transfer  Transfers assist     Chair/bed transfer assist level: Supervision/Verbal cueing     Locomotion Ambulation   Ambulation assist      Assist level: Supervision/Verbal cueing Assistive device: Walker-rolling Max distance: 150 ft   Walk 10 feet activity   Assist     Assist level: Supervision/Verbal cueing Assistive device: Walker-rolling   Walk 50 feet activity   Assist    Assist level: Supervision/Verbal cueing Assistive device: Walker-rolling    Walk 150 feet activity   Assist Walk 150 feet activity did not occur: Safety/medical concerns(unable to maintain TDWB for this distance today)  Assist level: Supervision/Verbal cueing Assistive device: Walker-rolling    Walk 10 feet on uneven surface  activity   Assist     Assist level: Contact Guard/Touching assist Assistive device: Walker-rolling   Wheelchair     Assist Will patient use wheelchair at discharge?: No Type of Wheelchair: Manual    Wheelchair assist  level: Set up assist Max wheelchair distance: 100 ft    Wheelchair 50 feet with 2 turns activity    Assist        Assist Level: Set up assist   Wheelchair 150 feet  activity     Assist      Assist Level: Supervision/Verbal cueing   Blood pressure (!) 167/86, pulse 73, temperature 98.1 F (36.7 C), temperature source Oral, resp. rate 16, height 5\' 7"  (1.702 m), weight 81.9 kg, SpO2 100 %.  Medical Problem List and Plan: 1. Fractures to left inferior and superior pubic ramus, left acetabulum, left sacrum/SI jt with associated pain and functional deficits         stable for d/c   Can see me in follow-up 2.  Antithrombotics: -DVT/anticoagulation:  Pharmaceutical: Lovenox, dopplers pending today             -antiplatelet therapy: Plavix 3. Pain Management: ON tylenol 1000 mg qid-->decrease due to age, baclofen 5 mg tid, gabapentin 600 mg bid/900 mg hs, oxycodone 10 mg qid, Oxycontin 10mg  BID, Ice prn             3/16-18 pain is controlled. Not using oxycodone around the clock.    -  oxy ir prior to am activities.  4. Mood: LCSW to follow for evaluation and support             -antipsychotic agents: N/A 5. Neuropsych: This patient is capable of making decisions on her own behalf. 6. Skin/Wound Care: Routine pressure relief measures.  7. Fluids/Electrolytes/Nutrition: Monitor I/O. Check lytes in am.  8. HTN: Monitor BP tid--continue Lasix. Has been elevated. Added Amlodipine 2.5mg  daily.   -bp generally improved Vitals:   05/28/19 1618 05/29/19 0427  BP: (!) 181/85 (!) 167/86  Pulse: 81 73  Resp: 19 16  Temp: 98 F (36.7 C) 98.1 F (36.7 C)  SpO2: 100% 100%  discussed taking extra amlodipine today at home and changing to pm amlodipine  9. H/o migraines: On Zoloft, Zonegran and inderal.  --appear controlled at present 10. Leucocytosis: Monitor for signs of infection.  3/16 wbc's down to 8.3  -low grade temp---no clinical signs of infection   -encourage IS OOB   -check venous dopplers as above 11. Acute renal failure/Hyponateremia: BUN/SCr 20/1.10 at admission-->35/1.68.Has trended up with addition of Toradol which we d'ced  -3/16 bun/cr  WNL   12. Constipation: On Senna and miralax.   -moving bowels daily    LOS: 6 days A FACE TO FACE EVALUATION WAS PERFORMED  Charlett Blake 05/29/2019, 10:16 AM

## 2019-05-29 NOTE — Progress Notes (Signed)
Patient was discharged from 4W09.  Patient left floor via wheelchair escorted by nursing staff.  Patient verbalized understanding of discharge instructions as given by  Algis Liming, PA.  All patient belongings sent with patient including DME and prescriptions.  Patient appears to be in no immediate distress at this time.    Brita Romp, RN

## 2019-05-29 NOTE — Progress Notes (Signed)
Occupational Therapy Session Note  Patient Details  Name: [name] MRN: [id number] Date of Birth: 1950/05/18  Today's Date: 05/29/2019 OT Individual Time: 8469-6295 OT Individual Time Calculation (min): 59 min   Short Term Goals: Week 1:  OT Short Term Goal 1 (Week 1): LTG=STG 2/2 ELOS  Skilled Therapeutic Interventions/Progress Updates:    Pt greeted in the recliner and premedicated for pain. Ready for her shower before d/c today. She engaged in bathing (at shower level), dressing (sit<stand from toilet), toileting (sit<stand from toilet), and grooming tasks (sitting in her recliner) during session. All functional transfers completed at Mod I level using RW, pt mindful her L LE TDWB precautions throughout. Pt able to retrieve needed ADL items and put away soiled clothing in dresser drawer. LH sponge used in the shower as needed as well. OT placed a Mod I sign on the door, explaining how she met her OT goals. Pt verbalized feeling proud of her accomplishments, grateful for interprofessional team during her rehab stay. RN staff made aware. Pt remained in the recliner with no further needs, ready for d/c home.   Therapy Documentation Precautions:  Precautions Precautions: Fall Restrictions Weight Bearing Restrictions: Yes LLE Weight Bearing: Touchdown weight bearing Pain: Pain Assessment Pain Scale: 0-10 Pain Score: 3  Pain Type: Acute pain Pain Location: Leg Pain Orientation: Left Pain Descriptors / Indicators: Aching Pain Onset: On-going Patients Stated Pain Goal: 3 Pain Intervention(s): Medication (See eMAR) ADL: ADL Eating: Independent Grooming: Independent Upper Body Bathing: Modified independent Lower Body Bathing: Modified independent Upper Body Dressing: Modified independent (Device) Lower Body Dressing: Modified independent Toileting: Modified independent Toilet Transfer: Modified independent Tub/Shower Transfer: Modified independent      Therapy/Group:  Individual Therapy  [name] 05/29/2019, 8:37 AM

## 2019-05-30 DIAGNOSIS — Z87891 Personal history of nicotine dependence: Secondary | ICD-10-CM | POA: Diagnosis not present

## 2019-05-30 DIAGNOSIS — E871 Hypo-osmolality and hyponatremia: Secondary | ICD-10-CM | POA: Diagnosis not present

## 2019-05-30 DIAGNOSIS — W19XXXD Unspecified fall, subsequent encounter: Secondary | ICD-10-CM | POA: Diagnosis not present

## 2019-05-30 DIAGNOSIS — I129 Hypertensive chronic kidney disease with stage 1 through stage 4 chronic kidney disease, or unspecified chronic kidney disease: Secondary | ICD-10-CM | POA: Diagnosis not present

## 2019-05-30 DIAGNOSIS — S3282XD Multiple fractures of pelvis without disruption of pelvic ring, subsequent encounter for fracture with routine healing: Secondary | ICD-10-CM | POA: Diagnosis not present

## 2019-05-30 DIAGNOSIS — Z9181 History of falling: Secondary | ICD-10-CM | POA: Diagnosis not present

## 2019-05-30 DIAGNOSIS — M5481 Occipital neuralgia: Secondary | ICD-10-CM | POA: Diagnosis not present

## 2019-05-30 DIAGNOSIS — D631 Anemia in chronic kidney disease: Secondary | ICD-10-CM | POA: Diagnosis not present

## 2019-05-30 DIAGNOSIS — Z8673 Personal history of transient ischemic attack (TIA), and cerebral infarction without residual deficits: Secondary | ICD-10-CM | POA: Diagnosis not present

## 2019-05-30 DIAGNOSIS — S3282XA Multiple fractures of pelvis without disruption of pelvic ring, initial encounter for closed fracture: Secondary | ICD-10-CM | POA: Diagnosis not present

## 2019-05-30 DIAGNOSIS — K5900 Constipation, unspecified: Secondary | ICD-10-CM | POA: Diagnosis not present

## 2019-05-30 DIAGNOSIS — N1831 Chronic kidney disease, stage 3a: Secondary | ICD-10-CM | POA: Diagnosis not present

## 2019-05-30 DIAGNOSIS — Z7901 Long term (current) use of anticoagulants: Secondary | ICD-10-CM | POA: Diagnosis not present

## 2019-05-30 DIAGNOSIS — S3210XD Unspecified fracture of sacrum, subsequent encounter for fracture with routine healing: Secondary | ICD-10-CM | POA: Diagnosis not present

## 2019-05-30 DIAGNOSIS — G43909 Migraine, unspecified, not intractable, without status migrainosus: Secondary | ICD-10-CM | POA: Diagnosis not present

## 2019-05-30 DIAGNOSIS — N179 Acute kidney failure, unspecified: Secondary | ICD-10-CM | POA: Diagnosis not present

## 2019-05-30 DIAGNOSIS — E785 Hyperlipidemia, unspecified: Secondary | ICD-10-CM | POA: Diagnosis not present

## 2019-05-30 DIAGNOSIS — S32402D Unspecified fracture of left acetabulum, subsequent encounter for fracture with routine healing: Secondary | ICD-10-CM | POA: Diagnosis not present

## 2019-05-30 DIAGNOSIS — M792 Neuralgia and neuritis, unspecified: Secondary | ICD-10-CM | POA: Diagnosis not present

## 2019-05-30 DIAGNOSIS — K219 Gastro-esophageal reflux disease without esophagitis: Secondary | ICD-10-CM | POA: Diagnosis not present

## 2019-05-30 DIAGNOSIS — Z86711 Personal history of pulmonary embolism: Secondary | ICD-10-CM | POA: Diagnosis not present

## 2019-05-30 NOTE — Progress Notes (Signed)
Social Work Discharge Note   The overall goal for the admission was met for:   Discharge location: Yes. D/c to home with 24/7 care from husband and support from family.   Length of Stay: Yes. 6 days.  Discharge activity level: Yes. Mod I  Home/community participation: Yes. Limited  Services provided included: MD, RD, PT, OT, RN, CM, TR, Pharmacy, Neuropsych and SW  Financial Services: Medicare and Other: Medicare A/B  Follow-up services arranged: Home Health: [facility] for PT/OT , DME: [facility] [facility], TTB, and RW and Patient/Family request agency HH: [facility], DME: N/A; TTB to be delivered and/or picked up by patient from [facility].   Comments (or additional information): contact pt [phone] or dtr in law [name] [phone]  Patient/Family verbalized understanding of follow-up arrangements: Yes  Individual responsible for coordination of the follow-up plan: Pt to have assistance with coordinating care needs by husband and dtr in law [place]  Confirmed correct DME delivered: Rana Snare 05/30/2019    Rana Snare

## 2019-05-30 NOTE — Telephone Encounter (Signed)
OK to fill

## 2019-05-31 ENCOUNTER — Other Ambulatory Visit: Payer: Self-pay | Admitting: Physical Medicine and Rehabilitation

## 2019-05-31 ENCOUNTER — Telehealth: Payer: Self-pay

## 2019-05-31 ENCOUNTER — Telehealth: Payer: Self-pay | Admitting: Registered Nurse

## 2019-05-31 DIAGNOSIS — M25552 Pain in left hip: Secondary | ICD-10-CM | POA: Insufficient documentation

## 2019-05-31 DIAGNOSIS — S3282XD Multiple fractures of pelvis without disruption of pelvic ring, subsequent encounter for fracture with routine healing: Secondary | ICD-10-CM | POA: Diagnosis not present

## 2019-05-31 DIAGNOSIS — N179 Acute kidney failure, unspecified: Secondary | ICD-10-CM | POA: Diagnosis not present

## 2019-05-31 DIAGNOSIS — S32402D Unspecified fracture of left acetabulum, subsequent encounter for fracture with routine healing: Secondary | ICD-10-CM | POA: Diagnosis not present

## 2019-05-31 DIAGNOSIS — N1831 Chronic kidney disease, stage 3a: Secondary | ICD-10-CM | POA: Diagnosis not present

## 2019-05-31 DIAGNOSIS — S3210XD Unspecified fracture of sacrum, subsequent encounter for fracture with routine healing: Secondary | ICD-10-CM | POA: Diagnosis not present

## 2019-05-31 DIAGNOSIS — I129 Hypertensive chronic kidney disease with stage 1 through stage 4 chronic kidney disease, or unspecified chronic kidney disease: Secondary | ICD-10-CM | POA: Diagnosis not present

## 2019-05-31 NOTE — Telephone Encounter (Signed)
Received 2 separate request:  [place] [phone] wants to verbal extend 2 X wk X wk 7  To increase strength/nurse eval med mngt and disease mngt [id number] 3:56pm  Erlene Quan called for OT [phone] wants OT for 2 X a week for 4 week PD:[id number] 0826am

## 2019-05-31 NOTE — Telephone Encounter (Signed)
Placed another call to Ms. [name], no answer, left message to return the call. Placed a call to [name], voicemail not set up, unable to leave message. Will await a return call.

## 2019-05-31 NOTE — Telephone Encounter (Signed)
Placed a call to Ms. [name] and her daughter in law Jennifer,no answer. Left message to return the call.

## 2019-05-31 NOTE — Telephone Encounter (Signed)
Orders approved and given to [place], [place] and [place], [place]

## 2019-05-31 NOTE — Telephone Encounter (Signed)
Yes to both. She just left [place] a few days ago.    You have all the attendings' automatic approval for [facility] therapy orders if they are in fact our patients, even if it's to extend current orders. We sign prescriptions/orders for [facility] services before they leave the hospital. We don't need to reaffirm that less than a week later.   Thanks.

## 2019-06-01 ENCOUNTER — Telehealth: Payer: Self-pay

## 2019-06-01 DIAGNOSIS — S3282XD Multiple fractures of pelvis without disruption of pelvic ring, subsequent encounter for fracture with routine healing: Secondary | ICD-10-CM | POA: Diagnosis not present

## 2019-06-01 DIAGNOSIS — N1831 Chronic kidney disease, stage 3a: Secondary | ICD-10-CM | POA: Diagnosis not present

## 2019-06-01 DIAGNOSIS — M25552 Pain in left hip: Secondary | ICD-10-CM | POA: Diagnosis not present

## 2019-06-01 DIAGNOSIS — I129 Hypertensive chronic kidney disease with stage 1 through stage 4 chronic kidney disease, or unspecified chronic kidney disease: Secondary | ICD-10-CM | POA: Diagnosis not present

## 2019-06-01 DIAGNOSIS — S3210XD Unspecified fracture of sacrum, subsequent encounter for fracture with routine healing: Secondary | ICD-10-CM | POA: Diagnosis not present

## 2019-06-01 DIAGNOSIS — M48061 Spinal stenosis, lumbar region without neurogenic claudication: Secondary | ICD-10-CM | POA: Diagnosis not present

## 2019-06-01 DIAGNOSIS — S32402D Unspecified fracture of left acetabulum, subsequent encounter for fracture with routine healing: Secondary | ICD-10-CM | POA: Diagnosis not present

## 2019-06-01 DIAGNOSIS — N179 Acute kidney failure, unspecified: Secondary | ICD-10-CM | POA: Diagnosis not present

## 2019-06-01 MED ORDER — AMLODIPINE BESYLATE 2.5 MG PO TABS: 2.5000 mg | ORAL_TABLET | Freq: Every day | ORAL | 0 refills | Status: DC

## 2019-06-01 MED ORDER — PANTOPRAZOLE SODIUM 40 MG PO TBEC: 40.0000 mg | DELAYED_RELEASE_TABLET | Freq: Every day | ORAL | 0 refills | Status: DC

## 2019-06-01 NOTE — Telephone Encounter (Signed)
Is it ok for [facility] nurse? No indication in SW discharge note.

## 2019-06-01 NOTE — Telephone Encounter (Signed)
Kelly with [facility] wants verbal to extend nursing to 1 week x9 and 2 PRN VISITS

## 2019-06-02 ENCOUNTER — Other Ambulatory Visit: Payer: Self-pay

## 2019-06-02 ENCOUNTER — Telehealth: Payer: Self-pay

## 2019-06-02 ENCOUNTER — Ambulatory Visit (INDEPENDENT_AMBULATORY_CARE_PROVIDER_SITE_OTHER): Payer: Medicare Other | Admitting: Family Medicine

## 2019-06-02 DIAGNOSIS — I952 Hypotension due to drugs: Secondary | ICD-10-CM | POA: Diagnosis not present

## 2019-06-02 NOTE — Telephone Encounter (Addendum)
Claiborne Billings, nurse with [facility] calls nurse line to report low blood pressure. Yesterday during visit, BP was 98/68. Patient was asymptomatic, with only complaint being sleepy. Patient has been taking three BP medications: amlodipine, ramipril, and propranolol.  Called patient to follow up and spoke with husband. Today BP has ranged from 90/59-54/38 (lowest). Most recent BP is 96/59. Husband reports that patient has had increased drowsiness and will fall asleep during conversation. Denies dizziness at this time. Initially advised patient that ED evaluated would be recommended due to blood pressure, however, husband states patient refuses to go due to long wait times.    Precepted with Dr. Gwendlyn Deutscher, who advised that patient be seen ASAP and can be seen in office if any openings.   Scheduled patient with access to care (Dr. Garlan Fillers).   Dr. Garlan Fillers spoke with patient's husband on phone, and advised that his medical recommendation would be for patient to be evaluated in ED. However, patient is already on the way to office. Patient to be evaluated in office by Dr. Garlan Fillers.   Talbot Grumbling, RN

## 2019-06-02 NOTE — Telephone Encounter (Signed)
Claiborne Billings from [facility] calling for nursing verbal orders as follows:  1 time(s) weekly for 1 week(s), then 2 time(s) weekly for 2 week(s), then  1 time (s) weekly for 6 weeks  You can leave verbal orders on confidential voicemail at [phone].  Talbot Grumbling, RN

## 2019-06-02 NOTE — Patient Instructions (Signed)
It was great to see you today! Thank you for letting me participate in your care!  Today, we discussed your low blood pressure and I am reassured that you are not having shortness of breath, chest pain, difficulty breathing, or confusion. If you develop any of these symptoms you must go to the ED immediately. We will call you tomorrow to ensure your blood pressure remains stable. Please do NOT take ANY blood pressure medication for the rest of today and tomorrow.  Be well, Harolyn Rutherford, DO PGY-3, [facility]

## 2019-06-02 NOTE — Progress Notes (Signed)
    SUBJECTIVE:   CHIEF COMPLAINT / HPI:   Hypotension Patient took BP this morning and states it was 63/39. Patient had home health nurse come to the home earlier today and recheck and she also found it was 60s/40s on two separate checks. Patient reports that she was feeling tired but denies confusion. I did talk to the patient's husband before she arrived and he said he thought she was confused. However, in clinic today she knows alert and responding to questions appropriately. She does endorse fatigue that is no worse and she states she believes this is from having a very active day yesterday. She states she has felt fatigued ever since she was hospitalized for her hip fracture.  PERTINENT  PMH / PSH:   OBJECTIVE:   BP (!) 105/58   Pulse 79   Wt 180 lb 6.4 oz (81.8 kg)   SpO2 98%   BMI 28.25 kg/m   Gen: Alert and Oriented x 3, NAD CV: RRR, no murmurs, normal S1, S2 split Resp: CTAB, no wheezing, rales, or rhonchi, comfortable work of breathing Abd: non-distended, non-tender, soft GU: + CVA tenderness bilaterally Skin: warm, dry, intact, no rashes  ASSESSMENT/PLAN:   Hypotension Patient had HTN in the hospital with several SBP documented over 180. She was discharged on propranolol, ramipril, and amlodipine. I believe we have over treated her HTN and perhaps her HTN was more due to acute pain due to her hip fracture. Reassured her BP increased to over 123XX123 systolic and Q000111Q diastolic x 2 on recheck in office. She is not altered. - Holding all anti-HTN meds today and tomorrow. Follow up in virtual clinic to ensure her BP does not rebound. - F/u in two weeks and slowly reintroduce BP meds one at a time.    Nuala Alpha, [place]

## 2019-06-02 NOTE — Telephone Encounter (Signed)
Okay for home health nurse

## 2019-06-03 ENCOUNTER — Telehealth (INDEPENDENT_AMBULATORY_CARE_PROVIDER_SITE_OTHER): Payer: Medicare Other | Admitting: Family Medicine

## 2019-06-03 ENCOUNTER — Other Ambulatory Visit: Payer: Self-pay | Admitting: Adult Health

## 2019-06-03 ENCOUNTER — Encounter: Payer: Medicare Other | Admitting: Registered Nurse

## 2019-06-03 ENCOUNTER — Telehealth: Payer: Self-pay

## 2019-06-03 DIAGNOSIS — I952 Hypotension due to drugs: Secondary | ICD-10-CM

## 2019-06-03 DIAGNOSIS — R232 Flushing: Secondary | ICD-10-CM

## 2019-06-03 DIAGNOSIS — I959 Hypotension, unspecified: Secondary | ICD-10-CM | POA: Insufficient documentation

## 2019-06-03 NOTE — Assessment & Plan Note (Signed)
Patient had HTN in the hospital with several SBP documented over 180. She was discharged on propranolol, ramipril, and amlodipine. I believe we have over treated her HTN and perhaps her HTN was more due to acute pain due to her hip fracture. Reassured her BP increased to over 123XX123 systolic and Q000111Q diastolic x 2 on recheck in office. She is not altered. - Holding all anti-HTN meds today and tomorrow. Follow up in virtual clinic to ensure her BP does not rebound. - F/u in two weeks and slowly reintroduce BP meds one at a time.

## 2019-06-03 NOTE — Progress Notes (Signed)
[place] Telemedicine Visit  Patient consented to have virtual visit. Method of visit: Telephone  Encounter participants: Patient: [name] - located at Home  Provider: Patriciaann Clan - located at [facility]  Others (if applicable): none   Chief Complaint: Blood pressure check  HPI: [name] is a 69 year old female presenting via telephone to discuss the following:  Blood pressure: Seen in the clinic on 3/25 for profound asymptomatic hypotension into the 0000000 systolic.  Felt that she was being overtreated with antihypertensive medications following recent hospitalization for numerous fractures while also taking opioids.  Discontinued medications that day and recommended phone follow-up.  Her husband took her blood pressure this morning which was 136/76 and her pulse was 76 bpm.  She is feeling at her baseline, denies any chest pain, shortness of breath, lightheadedness/dizziness, headaches.  Multiple pelvic fractures: Recent hospitalization for this, discharged 3/21.  Currently taking oxycodone 5-10 mg 3 times a day.  Appears to be controlling pain well.  Diaphoresis/flushing episodes: States she has been having these for the past several months where she all of a sudden is diaphoretic and flushing mainly in the mornings.  Usually will last 30 minutes or so.  However sometimes will also have it in the evenings.  She usually is more irritable/anxious prior to these episodes.  Does have a history of anxiety on Zoloft 100 mg.  Has been postmenopausal for over 10 years.   ROS: per HPI  Pertinent PMHx: PVD, previous PE, hypertension, multiple fractures with muscular deconditioning, migraines, hyperlipidemia  Exam:  Respiratory: Unlabored breathing, speaking in full sentences  Assessment/Plan:  Hypotension Resolved with holding antihypertensive medications since 3/25, likely related to overtreatment and current opioid use.  Discussed continued daily BP  monitoring and to call if BP consistently >140/90, especially as she should be completely tapering off of her opioids within the next week or two.  At that time could consider stepwise reintroduction of medications.   Flushing Reports frequent flushing and diaphoretic episodes, commonly in the morning, for the past several months.  Unclear etiology, considered related to anxiety, thyroid dysfunction (TSH wnl in 2020), glucose dysregulation, menopause syndrome, medication SE (ACE/CCB), mastocytosis.  However, given clinical picture do suspect it is most likely related to anxiety exacerbations, discussed relaxation and breathing techniques to use when she is feeling particularly restless.  She is comfortable on Zoloft 100 mg for now, will continue as is.  On follow-up, recommend further discussion of complaint and consider obtaining A1c to rule out underlying diabetes (especially with elevated fasting glucose during hospitalization).  Reassuringly had a recent CBC/BMP without significant abnormalities to relate to flushing/diaphoresis.   Recommend follow-up in the next 1-2 weeks or sooner if needed if BP significantly elevated.   Time spent during visit with patient: 18 minutes  Patriciaann Clan, DO

## 2019-06-03 NOTE — Telephone Encounter (Signed)
Dr. Letta Pate approved verbal orders for home health nursing.  I contacted Barbie Haggis, PT, [facility] and gave verbal orders for [facility] per his approval

## 2019-06-03 NOTE — Telephone Encounter (Signed)
Home health nurse is apparently calling multiple offices for verbal orders.  Dr. Letta Pate [facility]) has agreed to give verbal orders for home health nursing. I called Kelli from [facility] this morning and gave her the verbal orders.

## 2019-06-04 ENCOUNTER — Other Ambulatory Visit: Payer: Self-pay | Admitting: Physical Medicine & Rehabilitation

## 2019-06-05 DIAGNOSIS — R232 Flushing: Secondary | ICD-10-CM | POA: Insufficient documentation

## 2019-06-05 NOTE — Assessment & Plan Note (Addendum)
Reports frequent flushing and diaphoretic episodes, commonly in the morning, for the past several months.  Unclear etiology, considered related to anxiety, thyroid dysfunction (TSH wnl in 2020), glucose dysregulation, menopause syndrome, medication SE (ACE/CCB), mastocytosis.  However, given clinical picture do suspect it is most likely related to anxiety exacerbations, discussed relaxation and breathing techniques to use when she is feeling particularly restless.  She is comfortable on Zoloft 100 mg for now, will continue as is.  On follow-up, recommend further discussion of complaint and consider obtaining A1c to rule out underlying diabetes (especially with elevated fasting glucose during hospitalization).  Reassuringly had a recent CBC/BMP without significant abnormalities to relate to flushing/diaphoresis.

## 2019-06-05 NOTE — Assessment & Plan Note (Addendum)
Resolved with holding antihypertensive medications since 3/25, likely related to overtreatment and current opioid use.  Discussed continued daily BP monitoring and to call if BP consistently >140/90, especially as she should be completely tapering off of her opioids within the next week or two.  At that time could consider stepwise reintroduction of medications.

## 2019-06-06 ENCOUNTER — Telehealth: Payer: Self-pay | Admitting: *Deleted

## 2019-06-06 DIAGNOSIS — S3282XD Multiple fractures of pelvis without disruption of pelvic ring, subsequent encounter for fracture with routine healing: Secondary | ICD-10-CM | POA: Diagnosis not present

## 2019-06-06 DIAGNOSIS — N179 Acute kidney failure, unspecified: Secondary | ICD-10-CM | POA: Diagnosis not present

## 2019-06-06 DIAGNOSIS — S32402D Unspecified fracture of left acetabulum, subsequent encounter for fracture with routine healing: Secondary | ICD-10-CM | POA: Diagnosis not present

## 2019-06-06 DIAGNOSIS — S3210XD Unspecified fracture of sacrum, subsequent encounter for fracture with routine healing: Secondary | ICD-10-CM | POA: Diagnosis not present

## 2019-06-06 DIAGNOSIS — I129 Hypertensive chronic kidney disease with stage 1 through stage 4 chronic kidney disease, or unspecified chronic kidney disease: Secondary | ICD-10-CM | POA: Diagnosis not present

## 2019-06-06 DIAGNOSIS — N1831 Chronic kidney disease, stage 3a: Secondary | ICD-10-CM | POA: Diagnosis not present

## 2019-06-06 MED ORDER — ONDANSETRON HCL 4 MG PO TABS: 4.0000 mg | ORAL_TABLET | Freq: Three times a day (TID) | ORAL | 0 refills | Status: DC | PRN

## 2019-06-06 NOTE — Telephone Encounter (Signed)
Refilled zofran. Suspecting medication side effect. Recent ECG reviewed

## 2019-06-06 NOTE — Telephone Encounter (Signed)
[name] from [facility] wanted to let PCP know a few things, pts husband shared with him today:  1. Pt is now c/o pain in right hip and leg 2. Was very confused last night, got up to make coffee @ 1 am because she thought it was morning. 3. Husband found her stuck between the table and wall with no walker.   If you have questions for Judson Roch, please call @ [phone]

## 2019-06-08 ENCOUNTER — Telehealth: Payer: Self-pay | Admitting: *Deleted

## 2019-06-08 NOTE — Telephone Encounter (Signed)
Primary Russell, MD  Chart reviewed as part of pre-operative protocol coverage. Because of Norita Davault Helmes's past medical history and time since last visit, he/she will require a follow-up visit in order to better assess preoperative cardiovascular risk. She has not been seen for over a year.  Will need appointment  Pre-op covering staff: - Please schedule appointment and call patient to inform them. - Please contact requesting surgeon's office via preferred method (i.e, phone, fax) to inform them of need for appointment prior to surgery.  If applicable, this message will also be routed to pharmacy pool and/or primary cardiologist for input on holding anticoagulant/antiplatelet agent as requested below so that this information is available at time of patient's appointment.   Jory Sims, NP  06/08/2019, 1:34 PM

## 2019-06-08 NOTE — Telephone Encounter (Signed)
   [facility] Pre-operative Risk Assessment    Request for surgical clearance:  1. What type of surgery is being performed? ESI Injection   2. When is this surgery scheduled? 06-21-2019   3. What type of clearance is required (medical clearance vs. Pharmacy clearance to hold med vs. Both)? Medical  4. Are there any medications that need to be held prior to surgery and how long?Plavix for 7 days   5. Practice name and name of physician performing surgery? [facility] Gerrit Halls, PA-C   6. What is your office phone number [phone] ext [phone]    7.   What is your office fax number [phone] ATTN: Marlowe Alt  8.   Anesthesia type (None, local, MAC, general) ? Georgiann Hahn 06/08/2019, 1:21 PM  _________________________________________________________________   (provider comments below)

## 2019-06-08 NOTE — Telephone Encounter (Signed)
Leave message to call back

## 2019-06-08 NOTE — Telephone Encounter (Signed)
Appointment schedule for Monday April 5th @ 8:45 am

## 2019-06-09 ENCOUNTER — Other Ambulatory Visit: Payer: Self-pay

## 2019-06-09 ENCOUNTER — Encounter: Payer: Self-pay | Admitting: Physical Medicine & Rehabilitation

## 2019-06-09 ENCOUNTER — Ambulatory Visit: Payer: Medicare Other

## 2019-06-09 ENCOUNTER — Encounter: Payer: Medicare Other | Attending: Physical Medicine & Rehabilitation | Admitting: Physical Medicine & Rehabilitation

## 2019-06-09 ENCOUNTER — Ambulatory Visit: Payer: Medicare Other | Admitting: Family Medicine

## 2019-06-09 VITALS — BP 169/75 | HR 91 | Temp 97.3°F | Ht 66.0 in | Wt 187.0 lb

## 2019-06-09 DIAGNOSIS — S32810S Multiple fractures of pelvis with stable disruption of pelvic ring, sequela: Secondary | ICD-10-CM | POA: Diagnosis not present

## 2019-06-09 DIAGNOSIS — Z5181 Encounter for therapeutic drug level monitoring: Secondary | ICD-10-CM | POA: Insufficient documentation

## 2019-06-09 DIAGNOSIS — Z79891 Long term (current) use of opiate analgesic: Secondary | ICD-10-CM | POA: Insufficient documentation

## 2019-06-09 DIAGNOSIS — G894 Chronic pain syndrome: Secondary | ICD-10-CM | POA: Diagnosis not present

## 2019-06-09 MED ORDER — OXYCODONE HCL ER 10 MG PO T12A: 10.0000 mg | EXTENDED_RELEASE_TABLET | Freq: Two times a day (BID) | ORAL | 0 refills | Status: DC

## 2019-06-09 MED ORDER — OXYCODONE HCL 5 MG PO TABS: 5.0000 mg | ORAL_TABLET | Freq: Four times a day (QID) | ORAL | 0 refills | Status: DC | PRN

## 2019-06-09 NOTE — Patient Instructions (Signed)
Take the oxycontin twice a day Take the oxycodone 5mg  one tab up to 4 tabs per day

## 2019-06-09 NOTE — Progress Notes (Signed)
Subjective:    Patient ID: [name], female    DOB: [birth year], 69 y.o.   MRN: [id number] 68 y.o. female with history of normocytic anemia, TIAs, HTN, cervical neuritis, migraines a fall 2 weeks 5 admission and pelvic fractures and poor pain control.  She was admitted on 03/06 at 22 with inability to ambulate and difficulty being managed at home.  CT of head was negative for acute changes.  CT pelvic and lumbar spine done for work-up and showed chronic compression fracture of L1 with less than 50% deformity and minimally displaced fracture of left pubis, left acetabular and left sacrum with extension to left SI joint.  Ortho was consulted for input and recommended TTWB RLE.  Gabapentin was added and titrated upwards for pain management.  IV Dilaudid was weaned off and multiple narcotics were added and titrated for better pain control.  Therapy was initiated and patient was noted to have functional deficits and mobility and ADLs.  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  Patient has been on chronic gabapentin for this.  She has had quite a bit of pain which has limited her ability to participate in home health therapy. The patient did run out of her pain medicine she was given a 7-day supply after discharge from inpatient rehabilitation.  She has been out for approximately 4 days. She was taking OxyContin CR 10 mg twice daily She also was taking oxycodone IR 5 mg, she was taking approximately 5 or 6 of these tablets per day  She has followed up with orthopedic surgery and they put a hold on her home health therapy until her pain was under better control. She has also experienced right lower extremity  pain which is described as burning tingling.  She has documented lumbar spinal stenosis at L5-S1 and has been set up for epidural injection with Dr. Nelva Bush in 1 to 2 weeks  Hypertension, elevated today but has 10 out of 10 pain Pain Inventory Average Pain 10 Pain Right Now 10 My pain is constant, sharp, stabbing and aching  In the last 24 hours, has pain interfered with the following? General activity 10 Relation with others 10 Enjoyment of life 10 What TIME of day is your pain at its worst? always Sleep (in general) Fair  Pain is worse with: walking, bending, sitting, inactivity, standing and some activites Pain improves with: heat/ice and medication Relief from Meds: 5  Mobility walk with assistance use a cane use a walker ability to climb steps?  no do you drive?  no  Function disabled: date disabled . retired  Neuro/Psych numbness tremor tingling trouble walking spasms depression anxiety suicidal thoughts  Prior Studies Any changes since last visit?  yes x-rays CT/MRI  Physicians involved in your care Any changes since last visit?  no   Family History  Problem Relation Age of Onset  . Cancer Mother 56       breast  . Alzheimer's disease Mother   . Prostate cancer Father   . Cancer Brother   . Hyperlipidemia Brother   . Lung cancer Father   . Dementia Maternal Grandmother   . Cancer Maternal Grandfather   . Cancer Brother   . Hyperlipidemia Brother   .  Dementia Brother        frontotemporal dementia   Social History   Socioeconomic History  . Marital status: Married    Spouse name: [name]  . Number of children: 1  . Years of education: 12+  . Highest education level: High school graduate  Occupational History  . Occupation: ACTIVITY DIRECTOR    Employer: [employer]  . Occupation: CNA  Tobacco Use  . Smoking status: Former Smoker    Packs/day: 0.30    Types: Cigarettes    Quit date: 11/01/2011    Years since quitting: 7.6  .  Smokeless tobacco: Never Used  Substance and Sexual Activity  . Alcohol use: No    Alcohol/week: 0.0 standard drinks  . Drug use: No  . Sexual activity: Yes    Partners: Male    Birth control/protection: Surgical    Comment: 1 sexual partner in last 12 months: married  Other Topics Concern  . Not on file  Social History Narrative   ** Merged History Encounter **       Patient is married ([name]). Patient has one child and grandchild. Her sons name is Shanon Brow. Patient sees them often. 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 Patient spends 6-10 hours with her 6 days a week.     Patient has a college education. Patient is very active in her church.  Hobbies- outside crafts and church choir.   Social Determinants of Health   Financial Resource Strain: Low Risk   . Difficulty of Paying Living Expenses: Not hard at all  Food Insecurity: No Food Insecurity  . Worried About Charity fundraiser in the Last Year: Never true  . Ran Out of Food in the Last Year: Never true  Transportation Needs: No Transportation Needs  . Lack of Transportation (Medical): No  . Lack of Transportation (Non-Medical): No  Physical Activity: Insufficiently Active  . Days of Exercise per Week: 7 days  . Minutes of Exercise per Session: 20 min  Stress: No Stress Concern Present  . Feeling of Stress : Only a little  Social Connections: Slightly Isolated  . Frequency of Communication with Friends and Family: Twice a week  . Frequency of Social Gatherings with Friends and Family: More than three times a week  . Attends Religious Services: 1 to 4 times per year  . Active Member of Clubs or Organizations: No  . Attends Archivist Meetings: Never  . Marital Status: Married   Past Surgical History:  Procedure Laterality Date  . ABDOMINAL HYSTERECTOMY    . BILIARY STENT PLACEMENT N/A 12/12/2015   Procedure: BILIARY STENT PLACEMENT;  Surgeon: Doran Stabler, MD;  Location: [facility];  Service: Endoscopy;  Laterality: N/A;  . BLADDER REPAIR    . CARPAL TUNNEL RELEASE    . CERVICAL FUSION    . CERVICAL SPINE SURGERY    . CHOLECYSTECTOMY N/A 12/06/2015   Procedure: LAPAROSCOPIC CHOLECYSTECTOMY WITH  INTRAOPERATIVE CHOLANGIOGRAM;  Surgeon: Stark Klein, MD;  Location: [facility];  Service: General;  Laterality: N/A;  . ELBOW SURGERY    . ERCP N/A 12/12/2015   Procedure: ENDOSCOPIC RETROGRADE CHOLANGIOPANCREATOGRAPHY (ERCP);  Surgeon: Doran Stabler, MD;  Location: [facility] ENDOSCOPY;  Service: Endoscopy;  Laterality: N/A;  . ESOPHAGOGASTRODUODENOSCOPY N/A 01/28/2016   Procedure: ESOPHAGOGASTRODUODENOSCOPY (EGD) Biliary STENT removal;  Surgeon: Doran Stabler, MD;  Location: [facility] ENDOS[facility];  Service: Gastroenterology;  Laterality: N/A;  .  IR GENERIC HISTORICAL  12/17/2015   IR US GUIDE BX ASP/DRAIN 12/17/2015 [facility]  . IR GENERIC HISTORICAL  12/17/2015   IR GUIDED DRAIN W CATHETER PLACEMENT 12/17/2015 [facility]  . IR GENERIC HISTORICAL  12/17/2015   IR SINUS/FIST TUBE CHK-NON GI 12/17/2015 [facility]  . KNEE SURGERY    . LUNG SURGERY    . TEE WITHOUT CARDIOVERSION N/A 12/19/2015   Procedure: TRANSESOPHAGEAL ECHOCARDIOGRAM (TEE);  Surgeon: Josue Hector, MD;  Location: [facility] ENDOSCOPY;  Service: Cardiovascular;  Laterality: N/A;   Past Medical History:  Diagnosis Date  . Anemia   . Cervical neuritis   . Chronic kidney disease    stage 3  . Dyslipidemia   . GERD (gastroesophageal reflux disease)   . History of transient ischemic attack (TIA)   . HTN (hypertension)   . Hyperlipidemia   . Migraine   . Migraine headache   . Occipital neuralgia    Left  . Pulmonary embolism ([place]) 2017  . Renal insufficiency   . Stroke ([place]) 10/2013   BP (!) 169/75   Pulse 91   Temp (!) 97.3 F (36.3 C)   Ht 5\' 6"  (1.676 m)   Wt 187 lb (84.8 kg)   SpO2 94%   BMI 30.18 kg/m   Opioid Risk Score:   Fall Risk Score:  `1  Depression screen PHQ  2/9  Depression screen [facility] 2/9 06/02/2019 01/06/2019 10/08/2018 09/17/2018 09/17/2018 06/04/2018 05/18/2018  Decreased Interest 0 0 0 0 0 0 0  Down, Depressed, Hopeless 0 0 0 0 0 0 0  PHQ - 2 Score 0 0 0 0 0 0 0  Altered sleeping - - - - - - -  Tired, decreased energy - - - - - - -  Change in appetite - - - - - - -  Feeling bad or failure about yourself  - - - - - - -  Trouble concentrating - - - - - - -  Moving slowly or fidgety/restless - - - - - - -  Suicidal thoughts - - - - - - -  PHQ-9 Score - - - - - - -  Difficult doing work/chores - - - - - - -  Some recent data might be hidden  '  Review of Systems  Constitutional: Positive for diaphoresis.  Respiratory: Positive for cough and wheezing.   Gastrointestinal: Negative.   Musculoskeletal: Positive for arthralgias, back pain and gait problem.       Spasms   Skin: Negative.   Neurological: Positive for tremors and numbness.       Tingling   Psychiatric/Behavioral: Positive for dysphoric mood. The patient is nervous/anxious.        Objective:   Physical Exam Vitals and nursing note reviewed.  Constitutional:      Appearance: Normal appearance. She is obese.  Eyes:     Extraocular Movements: Extraocular movements intact.     Conjunctiva/sclera: Conjunctivae normal.     Pupils: Pupils are equal, round, and reactive to light.  Cardiovascular:     Rate and Rhythm: Normal rate and regular rhythm.     Heart sounds: Normal heart sounds. No murmur.  Pulmonary:     Effort: Pulmonary effort is normal. No respiratory distress.     Breath sounds: Normal breath sounds. No stridor. No wheezing or rhonchi.  Abdominal:     General: Abdomen is flat. Bowel sounds are normal. There is no distension.     Palpations: Abdomen is  soft.     Tenderness: There is no abdominal tenderness.  Musculoskeletal:     Comments: No pain to palpation in the lumbar area there is tenderness over the sacrum left greater than right side.  No tenderness over  the greater trochanter of the hip bilaterally.  Neurological:     General: No focal deficit present.     Mental Status: She is alert and oriented to person, place, and time.     Comments: Motor strength is 4/5 bilateral hip flexor knee extensor ankle dorsiflexor. Sensation right lower extremity is intact L4 L5-S1 dermatome distribution light touch and pinprick. Negative straight leg raising test  Psychiatric:        Mood and Affect: Mood normal.        Behavior: Behavior normal.           Assessment & Plan:  #1.  Subacute pain after fracture.  She has had extensive pelvic fractures including left acetabular sacral and pubic ramus.  She is nonweightbearing on the left lower extremity.  The patient does have increased pain but because she did run out of her pain medication she took about 6 of the oxycodone 5 mg tablets per day in addition to her OxyContin.  We discussed that I would be comfortable refilling these medications however I would limit the oxycodone IR 5 mg to 4 tablets/day Would recommend OxyContin CR 10 mg twice a day Follow-up in 1 month will reassess pain levels hope to start weaning off OxyContin at that time Check urine toxicology next visit, will also get controlled substance agreement she may be on these medicines for several months yet 2.  Lumbar spinal stenosis with radicular pain she is already on a maximum dose of gabapentin in addition she is on Zonegran for migraines which may also have some neuropathic pain benefit.  Patient will follow up with Dr. Nelva Bush for epidural injection  #3.  Hypertension should improve once pain is under better control.  She will follow-up with her primary care provider at the family practice center

## 2019-06-10 ENCOUNTER — Other Ambulatory Visit: Payer: Self-pay | Admitting: Physical Medicine and Rehabilitation

## 2019-06-10 DIAGNOSIS — S3282XD Multiple fractures of pelvis without disruption of pelvic ring, subsequent encounter for fracture with routine healing: Secondary | ICD-10-CM | POA: Diagnosis not present

## 2019-06-10 DIAGNOSIS — S32402D Unspecified fracture of left acetabulum, subsequent encounter for fracture with routine healing: Secondary | ICD-10-CM | POA: Diagnosis not present

## 2019-06-10 DIAGNOSIS — N1831 Chronic kidney disease, stage 3a: Secondary | ICD-10-CM | POA: Diagnosis not present

## 2019-06-10 DIAGNOSIS — I129 Hypertensive chronic kidney disease with stage 1 through stage 4 chronic kidney disease, or unspecified chronic kidney disease: Secondary | ICD-10-CM | POA: Diagnosis not present

## 2019-06-10 DIAGNOSIS — N179 Acute kidney failure, unspecified: Secondary | ICD-10-CM | POA: Diagnosis not present

## 2019-06-10 DIAGNOSIS — S3210XD Unspecified fracture of sacrum, subsequent encounter for fracture with routine healing: Secondary | ICD-10-CM | POA: Diagnosis not present

## 2019-06-13 ENCOUNTER — Ambulatory Visit: Payer: Medicare Other

## 2019-06-13 ENCOUNTER — Ambulatory Visit (INDEPENDENT_AMBULATORY_CARE_PROVIDER_SITE_OTHER): Payer: Medicare Other | Admitting: Internal Medicine

## 2019-06-13 ENCOUNTER — Other Ambulatory Visit: Payer: Self-pay

## 2019-06-13 ENCOUNTER — Encounter: Payer: Self-pay | Admitting: Internal Medicine

## 2019-06-13 VITALS — BP 114/66 | HR 77 | Ht 66.0 in | Wt 180.0 lb

## 2019-06-13 DIAGNOSIS — S32402D Unspecified fracture of left acetabulum, subsequent encounter for fracture with routine healing: Secondary | ICD-10-CM | POA: Diagnosis not present

## 2019-06-13 DIAGNOSIS — I2584 Coronary atherosclerosis due to calcified coronary lesion: Secondary | ICD-10-CM

## 2019-06-13 DIAGNOSIS — Z8673 Personal history of transient ischemic attack (TIA), and cerebral infarction without residual deficits: Secondary | ICD-10-CM

## 2019-06-13 DIAGNOSIS — Z01810 Encounter for preprocedural cardiovascular examination: Secondary | ICD-10-CM | POA: Diagnosis not present

## 2019-06-13 DIAGNOSIS — N1831 Chronic kidney disease, stage 3a: Secondary | ICD-10-CM | POA: Diagnosis not present

## 2019-06-13 DIAGNOSIS — E871 Hypo-osmolality and hyponatremia: Secondary | ICD-10-CM | POA: Diagnosis not present

## 2019-06-13 DIAGNOSIS — N179 Acute kidney failure, unspecified: Secondary | ICD-10-CM | POA: Diagnosis not present

## 2019-06-13 DIAGNOSIS — M792 Neuralgia and neuritis, unspecified: Secondary | ICD-10-CM | POA: Diagnosis not present

## 2019-06-13 DIAGNOSIS — D631 Anemia in chronic kidney disease: Secondary | ICD-10-CM | POA: Diagnosis not present

## 2019-06-13 DIAGNOSIS — W19XXXD Unspecified fall, subsequent encounter: Secondary | ICD-10-CM

## 2019-06-13 DIAGNOSIS — I2510 Atherosclerotic heart disease of native coronary artery without angina pectoris: Secondary | ICD-10-CM | POA: Diagnosis not present

## 2019-06-13 DIAGNOSIS — Z86711 Personal history of pulmonary embolism: Secondary | ICD-10-CM

## 2019-06-13 DIAGNOSIS — K5900 Constipation, unspecified: Secondary | ICD-10-CM

## 2019-06-13 DIAGNOSIS — S3282XD Multiple fractures of pelvis without disruption of pelvic ring, subsequent encounter for fracture with routine healing: Secondary | ICD-10-CM | POA: Diagnosis not present

## 2019-06-13 DIAGNOSIS — G43909 Migraine, unspecified, not intractable, without status migrainosus: Secondary | ICD-10-CM

## 2019-06-13 DIAGNOSIS — E785 Hyperlipidemia, unspecified: Secondary | ICD-10-CM | POA: Diagnosis not present

## 2019-06-13 DIAGNOSIS — K219 Gastro-esophageal reflux disease without esophagitis: Secondary | ICD-10-CM | POA: Diagnosis not present

## 2019-06-13 DIAGNOSIS — S3210XD Unspecified fracture of sacrum, subsequent encounter for fracture with routine healing: Secondary | ICD-10-CM | POA: Diagnosis not present

## 2019-06-13 DIAGNOSIS — M5481 Occipital neuralgia: Secondary | ICD-10-CM | POA: Diagnosis not present

## 2019-06-13 DIAGNOSIS — Z87891 Personal history of nicotine dependence: Secondary | ICD-10-CM

## 2019-06-13 DIAGNOSIS — Z7901 Long term (current) use of anticoagulants: Secondary | ICD-10-CM

## 2019-06-13 DIAGNOSIS — Z9181 History of falling: Secondary | ICD-10-CM

## 2019-06-13 DIAGNOSIS — I129 Hypertensive chronic kidney disease with stage 1 through stage 4 chronic kidney disease, or unspecified chronic kidney disease: Secondary | ICD-10-CM | POA: Diagnosis not present

## 2019-06-13 LAB — LIPID PANEL
Chol/HDL Ratio: 4.4 ratio (ref 0.0–4.4)
Cholesterol, Total: 210 mg/dL — ABNORMAL HIGH (ref 100–199)
HDL: 48 mg/dL (ref >39)
LDL Chol Calc (NIH): 123 mg/dL — ABNORMAL HIGH (ref 0–99)
Triglycerides: 224 mg/dL — ABNORMAL HIGH (ref 0–149)
VLDL Cholesterol Cal: 39 mg/dL (ref 5–40)

## 2019-06-13 NOTE — Patient Instructions (Signed)
Medication Instructions:  Your physician recommends that you continue on your current medications as directed. Please refer to the Current Medication list given to you today.  *If you need a refill on your cardiac medications before your next appointment, please call your pharmacy*   Lab Work: FASTING lipid panel If you have labs (blood work) drawn today and your tests are completely normal, you will receive your results only by: Marland Kitchen MyChart Message (if you have MyChart) OR . A paper copy in the mail If you have any lab test that is abnormal or we need to change your treatment, we will call you to review the results.   Testing/Procedures: NONE   Follow-Up: At [facility], you and your health needs are our priority.  As part of our continuing mission to provide you with exceptional heart care, we have created designated Provider Care Teams.  These Care Teams include your primary Cardiologist (physician) and Advanced Practice Providers (APPs -  Physician Assistants and Nurse Practitioners) who all work together to provide you with the care you need, when you need it.  We recommend signing up for the patient portal called "MyChart".  Sign up information is provided on this After Visit Summary.  MyChart is used to connect with patients for Virtual Visits (Telemedicine).  Patients are able to view lab/test results, encounter notes, upcoming appointments, etc.  Non-urgent messages can be sent to your provider as well.   To learn more about what you can do with MyChart, go to [email or web address].    Your next appointment:   6 month(s)  The format for your next appointment:   In Person  Provider:   You may see Dr. Skeet Latch or one of the following Advanced Practice Providers on your designated Care Team:    Kerin Ransom, PA-C  [place], [place]  Coletta Memos, [place]    Other Instructions  OK to hold Plavix for 5 days prior to procedure

## 2019-06-13 NOTE — Progress Notes (Signed)
OFFICE NOTE  Chief Complaint:  Preop clearance  Primary Care Physician: Richarda Osmond, DO  HPI:  [name] is a 69 y.o. female with a past medial history significant for stroke and prior PE, hypertension, dyslipidemia and chronic kidney disease, found to have multivessel coronary artery calcification and marked dyslipidemia in the past.  She has been intolerant to statins.  She is also been suffering from low back pain and instability of the pelvis.  She was an add-on today to the DOD schedule for an urgent preprocedural clearance. She is scheduled for an upcoming injection and would need to come off of her Plavix prior to that.  She denies any chest pain or worsening shortness of breath.  EKG today shows sinus rhythm.  Of note her last labs showed an LDL of 174, off of a statin.  She had been working with her lipid clinic in order to start on Repatha.  She said she took 3 doses of it and had felt jittery in her legs after the dose which lasted for about 24 hours each time.  Subsequent to that she discontinued the medicine.  She has not had repeat lipids since then.  PMHx:  Past Medical History:  Diagnosis Date  . Anemia   . Cervical neuritis   . Chronic kidney disease    stage 3  . Dyslipidemia   . GERD (gastroesophageal reflux disease)   . History of transient ischemic attack (TIA)   . HTN (hypertension)   . Hyperlipidemia   . Migraine   . Migraine headache   . Occipital neuralgia    Left  . Pulmonary embolism ([place]) 2017  . Renal insufficiency   . Stroke [facility]) 10/2013    Past Surgical History:  Procedure Laterality Date  . ABDOMINAL HYSTERECTOMY    . BILIARY STENT PLACEMENT N/A 12/12/2015   Procedure: BILIARY STENT PLACEMENT;  Surgeon: Doran Stabler, MD;  Location: [facility] ENDOSCOPY;  Service: Endoscopy;  Laterality: N/A;  . BLADDER REPAIR    . CARPAL TUNNEL RELEASE    . CERVICAL FUSION    . CERVICAL SPINE SURGERY    . CHOLECYSTECTOMY N/A 12/06/2015   Procedure: LAPAROSCOPIC CHOLECYSTECTOMY WITH  INTRAOPERATIVE CHOLANGIOGRAM;  Surgeon: Stark Klein, MD;  Location: [facility];  Service: General;  Laterality: N/A;  . ELBOW SURGERY    . ERCP N/A 12/12/2015   Procedure: ENDOSCOPIC RETROGRADE CHOLANGIOPANCREATOGRAPHY (ERCP);  Surgeon: Doran Stabler, MD;  Location[facility] ENDOSCOPY;  Service: Endoscopy;  Laterality: N/A;  . ESOPHAGOGASTRODUODENOSCOPY N/A 01/28/2016   Procedure: ESOPHAGOGASTRODUODENOSCOPY (EGD) Biliary STENT removal;  Surgeon: Doran Stabler, MD;  Location[facility] ENDOSCOPY;  Service: Gastroenterology;  Laterality: N/A;  . IR GENERIC HISTORICAL  12/17/2015   IR US GUIDE BX ASP/DRAIN 12/17/2015 [facility]  . IR GENERIC HISTORICAL  12/17/2015   IR GUIDED DRAIN W CATHETER PLACEMENT 12/17/2015 [facility]  . IR GENERIC HISTORICAL  12/17/2015   IR SINUS/FIST TUBE CHK-NON GI 12/17/2015 [facility]  . KNEE SURGERY    . LUNG SURGERY    . TEE WITHOUT CARDIOVERSION N/A 12/19/2015   Procedure: TRANSESOPHAGEAL ECHOCARDIOGRAM (TEE);  Surgeon: Josue Hector, MD;  Location: [facility] ENDOSCOPY;  Service: Cardiovascular;  Laterality: N/A;    FAMHx:  Family History  Problem Relation Age of Onset  . Cancer Mother 70       breast  . Alzheimer's disease Mother   . Prostate cancer Father   . Cancer Brother   . Hyperlipidemia Brother   .  Lung cancer Father   . Dementia Maternal Grandmother   . Cancer Maternal Grandfather   . Cancer Brother   . Hyperlipidemia Brother   . Dementia Brother        frontotemporal dementia    SOCHx:   reports that she quit smoking about 7 years ago. Her smoking use included cigarettes. She smoked 0.30 packs per day. She has never used smokeless tobacco. She reports that she does not drink alcohol or use drugs.  ALLERGIES:  Allergies  Allergen Reactions  . Fish Allergy Anaphylaxis  . Iodinated Diagnostic Agents Shortness Of Breath and Itching    States she had this reaction connected with a MRI of the cervical spine.   . Shellfish Allergy Anaphylaxis and Other (See Comments)    All seafood  . Shellfish Allergy Anaphylaxis  . Statins Other (See Comments)    Arthralgias (severe) with atorvastatin, mild arthralgias with rosuvastatin but willing to continue taking it    ROS: Pertinent items noted in HPI and remainder of comprehensive ROS otherwise negative.  HOME MEDS: Current Outpatient Medications on File Prior to Visit  Medication Sig Dispense Refill  . acetaminophen (TYLENOL) 325 MG tablet Take 2 tablets (650 mg total) by mouth 4 (four) times daily -  with meals and at bedtime.    Marland Kitchen amLODipine (NORVASC) 2.5 MG tablet Take 1 tablet (2.5 mg total) by mouth daily. CALL OFFICE FOR APPOINTMENT 30 tablet 0  . Baclofen 5 MG TABS Take 5 mg by mouth 3 (three) times daily. 90 tablet 0  . calcium carbonate (OS-CAL - DOSED IN MG OF ELEMENTAL CALCIUM) 1250 (500 Ca) MG tablet Take 1 tablet (500 mg of elemental calcium total) by mouth daily with breakfast.    . cetirizine (ZYRTEC) 10 MG tablet Take 10 mg by mouth daily with breakfast.    . clopidogrel (PLAVIX) 75 MG tablet Take 1 tablet (75 mg total) by mouth daily. 90 tablet 3  . diclofenac Sodium (VOLTAREN) 1 % GEL Apply 2 g topically 4 (four) times daily. To left hip 350 g 0  . ELDERBERRY PO Take 1 tablet by mouth daily with breakfast.    . ezetimibe (ZETIA) 10 MG tablet Take 10 mg by mouth daily.     . fluticasone (FLONASE) 50 MCG/ACT nasal spray Place 1 spray into both nostrils daily.  2  . gabapentin (NEURONTIN) 300 MG capsule Take 2 pills twice a day at 8 am/4 pm and 3 pills at bedtime 250 capsule 0  . lidocaine (LIDODERM) 5 % APPLY 2 PATCHES ONTO THE SKIN DAILY IN THE MORNING AND THEN REMOVE AFTER 12 HOURS 60 patch 5  . Multiple Vitamins-Minerals (HAIR/SKIN/NAILS/BIOTIN) TABS Take 2 tablets by mouth daily with breakfast.    . ondansetron (ZOFRAN) 4 MG tablet Take 1 tablet (4 mg total) by mouth every 8 (eight) hours as needed for nausea or vomiting. 20 tablet 0   . oxyCODONE (OXY IR/ROXICODONE) 5 MG immediate release tablet Take 1 tablet (5 mg total) by mouth every 6 (six) hours as needed for severe pain. 120 tablet 0  . oxyCODONE (OXYCONTIN) 10 mg 12 hr tablet Take 1 tablet (10 mg total) by mouth every 12 (twelve) hours. 60 tablet 0  . pantoprazole (PROTONIX) 40 MG tablet Take 1 tablet (40 mg total) by mouth daily with breakfast. CALL OFFICE FOR APPOINTMENT 30 tablet 0  . polyethylene glycol (MIRALAX / GLYCOLAX) 17 g packet Take 17 g by mouth daily as needed. 14 each 0  . propranolol (  INDERAL) 10 MG tablet TAKE 1 TABLET(10 MG) BY MOUTH TWICE DAILY 90 tablet 3  . ramipril (ALTACE) 2.5 MG capsule TAKE 1 CAPSULE(2.5 MG) BY MOUTH DAILY WITH BREAKFAST 90 capsule 1  . senna (SENOKOT) 8.6 MG TABS tablet Take 1 tablet (8.6 mg total) by mouth daily. 120 tablet 0  . sertraline (ZOLOFT) 100 MG tablet Take 1 tablet (100 mg total) by mouth daily. 100 tablet 0  . traMADol (ULTRAM) 50 MG tablet Take 1 tablet (50 mg total) by mouth 4 (four) times daily as needed for moderate pain. 20 tablet 0  . Vitamin D, Ergocalciferol, (DRISDOL) 1.25 MG (50000 UNIT) CAPS capsule Take 1 capsule (50,000 Units total) by mouth every 7 (seven) days. 4 capsule 0  . zonisamide (ZONEGRAN) 100 MG capsule TAKE 1 CAPSULE(100 MG) BY MOUTH TWICE DAILY 60 capsule 2  . zonisamide (ZONEGRAN) 25 MG capsule take 25 mg twice daily prn in addition to 100 mg twice daily. 60 capsule 11   No current facility-administered medications on file prior to visit.    LABS/IMAGING: No results found for this or any previous visit (from the past 48 hour(s)). No results found.  LIPID PANEL:    Component Value Date/Time   CHOL 244 (H) 08/16/2018 0811   TRIG 129 08/16/2018 0811   HDL 44 08/16/2018 0811   CHOLHDL 5.5 (H) 08/16/2018 0811   CHOLHDL 2.2 07/13/2015 1056   VLDL 10 07/13/2015 1056   LDLCALC 174 (H) 08/16/2018 0811   LDLDIRECT 156 (H) 03/27/2017 0939     WEIGHTS: Wt Readings from Last 3  Encounters:  06/13/19 180 lb (81.6 kg)  06/09/19 187 lb (84.8 kg)  06/02/19 180 lb 6.4 oz (81.8 kg)    VITALS: BP 114/66 (BP Location: Left Arm, Patient Position: Sitting, Cuff Size: Large)   Pulse 77   Ht 5\' 6"  (1.676 m)   Wt 180 lb (81.6 kg)   BMI 29.05 kg/m   EXAM: General appearance: alert, no distress and In a wheelchair Neck: no carotid bruit, no JVD and thyroid not enlarged, symmetric, no tenderness/mass/nodules Lungs: clear to auscultation bilaterally Heart: regular rate and rhythm Abdomen: soft, non-tender; bowel sounds normal; no masses,  no organomegaly Extremities: extremities normal, atraumatic, no cyanosis or edema Pulses: 2+ and symmetric Skin: Skin color, texture, turgor normal. No rashes or lesions Neurologic: Mental status: Alert, oriented, thought content appropriate Psych: Pleasant  EKG: Sinus rhythm, poor R wave progression at 77-personally reviewed- personally reviewed  ASSESSMENT: 1. Acceptable risk for upcoming injection 2. Marked dyslipidemia-statin intolerant, myalgias 3. Multivessel coronary calcification 4. History of stroke  PLAN: 1.   [name] is at acceptable risk for upcoming spinal injection.  She should hold her Plavix 5 days prior to the procedure and restart at least 24 hours after.  She does have marked dyslipidemia and has been statin intolerant.  She was trialed on Repatha but only took 3 doses and noted "jitteriness" in her legs.  This could be a function of the injection in her may be related to her back problems.  I think would be reasonable to consider an alternative PCSK9 and trial Praluent.  She will need repeat lipids as her last numbers are close to a year old.  Will work with her in the lipid clinic on that.  She should follow-up with her primary cardiologist Dr. Oval Linsey in about 6 months or sooner as necessary.  Pixie Casino, MD, [facility], [place] Director of  the Advanced Lipid Disorders &   Cardiovascular Risk Reduction Clinic Diplomate of the American Board of Clinical Lipidology Attending Cardiologist  Direct Dial: [phone]  Fax: [phone]  Website:  [email or web address] Kandice [email or web address] 06/13/2019, 9:02 AM

## 2019-06-13 NOTE — Telephone Encounter (Signed)
Patient left a message stating she needs a refill sent in for her baclofen.  We received an electronic refill request as well

## 2019-06-14 ENCOUNTER — Telehealth: Payer: Self-pay | Admitting: *Deleted

## 2019-06-14 ENCOUNTER — Telehealth: Payer: Self-pay | Admitting: Internal Medicine

## 2019-06-14 ENCOUNTER — Other Ambulatory Visit: Payer: Self-pay | Admitting: Physical Medicine and Rehabilitation

## 2019-06-14 DIAGNOSIS — I129 Hypertensive chronic kidney disease with stage 1 through stage 4 chronic kidney disease, or unspecified chronic kidney disease: Secondary | ICD-10-CM | POA: Diagnosis not present

## 2019-06-14 DIAGNOSIS — N1831 Chronic kidney disease, stage 3a: Secondary | ICD-10-CM | POA: Diagnosis not present

## 2019-06-14 DIAGNOSIS — S3210XD Unspecified fracture of sacrum, subsequent encounter for fracture with routine healing: Secondary | ICD-10-CM | POA: Diagnosis not present

## 2019-06-14 DIAGNOSIS — S3282XD Multiple fractures of pelvis without disruption of pelvic ring, subsequent encounter for fracture with routine healing: Secondary | ICD-10-CM | POA: Diagnosis not present

## 2019-06-14 DIAGNOSIS — N179 Acute kidney failure, unspecified: Secondary | ICD-10-CM | POA: Diagnosis not present

## 2019-06-14 DIAGNOSIS — S32402D Unspecified fracture of left acetabulum, subsequent encounter for fracture with routine healing: Secondary | ICD-10-CM | POA: Diagnosis not present

## 2019-06-14 NOTE — Telephone Encounter (Signed)
Patient notified, LVM at pharmacy

## 2019-06-14 NOTE — Telephone Encounter (Signed)
Prior authorization submitted via covermymeds for oxycontin.  Approved  Case [id number]  Coverage Start Date: 05/14/2019  Coverage End Date: 06/12/2020

## 2019-06-14 NOTE — Telephone Encounter (Signed)
Patient left a message stating that she needs a prior authorization for her oxycontin.

## 2019-06-14 NOTE — Telephone Encounter (Signed)
ID# [id number] RxBin# [id number] RxPCN# [id number] RxGrp# [id number]

## 2019-06-14 NOTE — Telephone Encounter (Signed)
Praluent 75mg /mL was approved per [facility] from 05/15/2019 - 06/13/2020 [facility] CMA submitted [place]

## 2019-06-14 NOTE — Telephone Encounter (Signed)
Please let patient know that this is available over-the-counter

## 2019-06-15 ENCOUNTER — Ambulatory Visit (INDEPENDENT_AMBULATORY_CARE_PROVIDER_SITE_OTHER): Payer: Medicare Other

## 2019-06-15 ENCOUNTER — Other Ambulatory Visit: Payer: Self-pay

## 2019-06-15 VITALS — BP 116/80 | HR 68

## 2019-06-15 DIAGNOSIS — Z0130 Encounter for examination of blood pressure without abnormal findings: Secondary | ICD-10-CM

## 2019-06-15 NOTE — Progress Notes (Signed)
Patient here today for BP check.      Last BP was on 3//25/2021 and was 105/58.  BP today is 116/80 with a pulse of 68.    Checked BP in right arm with regular cuff.    Symptoms present: none.  Patient instructed at last visit to quit taking BP medications, as BP was low.   Patient will follow up in two weeks with PCP.   Spoke with Dr. Owens Shark, who was agreeable with plan.   Return precautions given to patient.   Routed note to PCP.         Talbot Grumbling, RN

## 2019-06-17 DIAGNOSIS — S3282XD Multiple fractures of pelvis without disruption of pelvic ring, subsequent encounter for fracture with routine healing: Secondary | ICD-10-CM | POA: Diagnosis not present

## 2019-06-17 DIAGNOSIS — N179 Acute kidney failure, unspecified: Secondary | ICD-10-CM | POA: Diagnosis not present

## 2019-06-17 DIAGNOSIS — I129 Hypertensive chronic kidney disease with stage 1 through stage 4 chronic kidney disease, or unspecified chronic kidney disease: Secondary | ICD-10-CM | POA: Diagnosis not present

## 2019-06-17 DIAGNOSIS — N1831 Chronic kidney disease, stage 3a: Secondary | ICD-10-CM | POA: Diagnosis not present

## 2019-06-17 DIAGNOSIS — S32402D Unspecified fracture of left acetabulum, subsequent encounter for fracture with routine healing: Secondary | ICD-10-CM | POA: Diagnosis not present

## 2019-06-17 DIAGNOSIS — S3210XD Unspecified fracture of sacrum, subsequent encounter for fracture with routine healing: Secondary | ICD-10-CM | POA: Diagnosis not present

## 2019-06-21 ENCOUNTER — Telehealth: Payer: Self-pay

## 2019-06-21 DIAGNOSIS — M5136 Other intervertebral disc degeneration, lumbar region: Secondary | ICD-10-CM | POA: Diagnosis not present

## 2019-06-21 NOTE — Telephone Encounter (Signed)
Tharon Aquas, PT from [facility] called stating patients Ortho doctor Dr. Rolena Infante ordered patient to hold PT for at least 3 weeks until f/u.

## 2019-06-22 ENCOUNTER — Telehealth: Payer: Self-pay | Admitting: Family Medicine

## 2019-06-22 ENCOUNTER — Telehealth: Payer: Self-pay

## 2019-06-22 ENCOUNTER — Other Ambulatory Visit: Payer: Self-pay | Admitting: *Deleted

## 2019-06-22 MED ORDER — PROPRANOLOL HCL 10 MG PO TABS: 10.0000 mg | ORAL_TABLET | Freq: Two times a day (BID) | ORAL | 0 refills | Status: DC

## 2019-06-22 MED ORDER — CLOPIDOGREL BISULFATE 75 MG PO TABS: 75.0000 mg | ORAL_TABLET | Freq: Every day | ORAL | 0 refills | Status: DC

## 2019-06-22 MED ORDER — PRALUENT 75 MG/ML ~~LOC~~ SOAJ: 75.0000 mg | SUBCUTANEOUS | 11 refills | Status: DC

## 2019-06-22 MED ORDER — SERTRALINE HCL 100 MG PO TABS: 100.0000 mg | ORAL_TABLET | Freq: Every day | ORAL | 0 refills | Status: DC

## 2019-06-22 NOTE — Telephone Encounter (Signed)
LMVM for pt that trying to reach for ? Relating to refill on tizanidine.

## 2019-06-22 NOTE — Telephone Encounter (Signed)
I spoke to pt and she is taking 2mg  po tid tizanidine, last filled 3-27/21 #270 at [facility] CW/[facility].  She is not needing a refill at this time.

## 2019-06-22 NOTE — Telephone Encounter (Signed)
I received a refill request for tizanidine for [name]. This medication was not listed on her medication list in epic. I documented she was taking it in October but not sure if something has changed since. Can you verify she is taking it, dose and frequency. TY!

## 2019-06-22 NOTE — Telephone Encounter (Signed)
lmomed the pt to start praluent 75mg  and that the rx sent to [facility] and instructed the pt to call back if unaffordable

## 2019-06-22 NOTE — Telephone Encounter (Signed)
No refills provided given patient will be following up with Dr. Prince Rome this month -- just incase change in regimen for any reason.

## 2019-06-23 ENCOUNTER — Other Ambulatory Visit: Payer: Self-pay

## 2019-06-23 ENCOUNTER — Telehealth: Payer: Self-pay

## 2019-06-23 ENCOUNTER — Other Ambulatory Visit: Payer: Self-pay | Admitting: Physical Medicine and Rehabilitation

## 2019-06-23 DIAGNOSIS — N1831 Chronic kidney disease, stage 3a: Secondary | ICD-10-CM | POA: Diagnosis not present

## 2019-06-23 DIAGNOSIS — S3282XD Multiple fractures of pelvis without disruption of pelvic ring, subsequent encounter for fracture with routine healing: Secondary | ICD-10-CM | POA: Diagnosis not present

## 2019-06-23 DIAGNOSIS — I129 Hypertensive chronic kidney disease with stage 1 through stage 4 chronic kidney disease, or unspecified chronic kidney disease: Secondary | ICD-10-CM | POA: Diagnosis not present

## 2019-06-23 DIAGNOSIS — N179 Acute kidney failure, unspecified: Secondary | ICD-10-CM | POA: Diagnosis not present

## 2019-06-23 DIAGNOSIS — S3210XD Unspecified fracture of sacrum, subsequent encounter for fracture with routine healing: Secondary | ICD-10-CM | POA: Diagnosis not present

## 2019-06-23 DIAGNOSIS — R300 Dysuria: Secondary | ICD-10-CM | POA: Diagnosis not present

## 2019-06-23 DIAGNOSIS — M48061 Spinal stenosis, lumbar region without neurogenic claudication: Secondary | ICD-10-CM | POA: Diagnosis not present

## 2019-06-23 DIAGNOSIS — S32402D Unspecified fracture of left acetabulum, subsequent encounter for fracture with routine healing: Secondary | ICD-10-CM | POA: Diagnosis not present

## 2019-06-23 DIAGNOSIS — S32592D Other specified fracture of left pubis, subsequent encounter for fracture with routine healing: Secondary | ICD-10-CM | POA: Diagnosis not present

## 2019-06-23 DIAGNOSIS — M25552 Pain in left hip: Secondary | ICD-10-CM | POA: Diagnosis not present

## 2019-06-23 NOTE — Telephone Encounter (Signed)
Judson Roch, nurse with [facility] calls nurse line regarding patient having bad odor and burning with urination. Received verbal orders for Dr. Owens Shark for urine culture. Home health nurse to bring sample to office for testing.   To PCP and Dr. Owens Shark.   Talbot Grumbling, RN

## 2019-06-23 NOTE — Addendum Note (Signed)
Addended by: [place] Pink on: 06/23/2019 11:37 AM   Modules accepted: Orders

## 2019-06-24 ENCOUNTER — Other Ambulatory Visit: Payer: Self-pay | Admitting: Student in an Organized Health Care Education/Training Program

## 2019-06-25 LAB — URINE CULTURE: Organism ID, Bacteria: NO GROWTH

## 2019-06-27 ENCOUNTER — Other Ambulatory Visit: Payer: Self-pay | Admitting: Physical Medicine and Rehabilitation

## 2019-06-27 DIAGNOSIS — S3210XD Unspecified fracture of sacrum, subsequent encounter for fracture with routine healing: Secondary | ICD-10-CM | POA: Diagnosis not present

## 2019-06-27 DIAGNOSIS — N179 Acute kidney failure, unspecified: Secondary | ICD-10-CM | POA: Diagnosis not present

## 2019-06-27 DIAGNOSIS — N1831 Chronic kidney disease, stage 3a: Secondary | ICD-10-CM | POA: Diagnosis not present

## 2019-06-27 DIAGNOSIS — I129 Hypertensive chronic kidney disease with stage 1 through stage 4 chronic kidney disease, or unspecified chronic kidney disease: Secondary | ICD-10-CM | POA: Diagnosis not present

## 2019-06-27 DIAGNOSIS — S32402D Unspecified fracture of left acetabulum, subsequent encounter for fracture with routine healing: Secondary | ICD-10-CM | POA: Diagnosis not present

## 2019-06-27 DIAGNOSIS — S3282XD Multiple fractures of pelvis without disruption of pelvic ring, subsequent encounter for fracture with routine healing: Secondary | ICD-10-CM | POA: Diagnosis not present

## 2019-06-28 ENCOUNTER — Other Ambulatory Visit: Payer: Self-pay

## 2019-06-28 ENCOUNTER — Encounter: Payer: Self-pay | Admitting: Student in an Organized Health Care Education/Training Program

## 2019-06-28 ENCOUNTER — Ambulatory Visit (INDEPENDENT_AMBULATORY_CARE_PROVIDER_SITE_OTHER): Payer: Medicare Other | Admitting: Student in an Organized Health Care Education/Training Program

## 2019-06-28 VITALS — BP 140/82 | HR 82 | Ht 66.0 in | Wt 180.2 lb

## 2019-06-28 DIAGNOSIS — M4807 Spinal stenosis, lumbosacral region: Secondary | ICD-10-CM | POA: Diagnosis not present

## 2019-06-28 DIAGNOSIS — I2584 Coronary atherosclerosis due to calcified coronary lesion: Secondary | ICD-10-CM | POA: Diagnosis not present

## 2019-06-28 DIAGNOSIS — I2510 Atherosclerotic heart disease of native coronary artery without angina pectoris: Secondary | ICD-10-CM

## 2019-06-28 MED ORDER — PREDNISONE 10 MG (21) PO TBPK: ORAL_TABLET | ORAL | 0 refills | Status: DC

## 2019-06-28 NOTE — Progress Notes (Addendum)
     SUBJECTIVE:   CHIEF COMPLAINT / HPI:   Spinal Stenosis/rediculopathy: She has spinal stenosis which was discovered in the hospital after her L hip fracture. Orthopedic doctor told her hip fracture was healing well. She has pain shooting down her R leg which she describes as a lightning bolt radiating into her foot. She was given an epidural Lidocaine injection last Tuesday at her othro appointment and that gave her about 24 hours of relief. She went back Thursday and was put on a prednisone pack that ends tomorrow. She states that she used to be very active but is now unable to walk much at all. She is able to ambulate to bathroom with walker and denies incontinence. She rates her pain currently at 8/10 and gradually getting worse. She doesn't believe that her medications are helping her pain much at all. The only thing she has found to ease the pain the hugging her leg as she sits down and slowly releasing it. She states that she is in chronic pain all the time and this greatly affecting her mood and quality of life. Denies SI. Ortho suggested taking a break from PT at this time due to her pain and inflammation. She would like a referral to a neurosurgeon.  PERTINENT  PMH / PSH: spinal stenosis, neuralgia, hypotension  OBJECTIVE:   BP 140/82   Pulse 82   Ht 5\' 6"  (1.676 m)   Wt 180 lb 4 oz (81.8 kg)   SpO2 96%   BMI 29.09 kg/m   Physical Exam  Constitutional: She is oriented to person, place, and time and well-developed, well-nourished, and in no distress.  Cardiovascular: Normal rate, regular rhythm and normal heart sounds.  Pulmonary/Chest: Effort normal and breath sounds normal.  Neurological: She is alert and oriented to person, place, and time.     ASSESSMENT/PLAN:   Neuralgia, neuritis, and radiculitis, unspecified Her MRI on 05/18/2019 showed inflammation that is likely causing impingement of her nerves. Prescribed her another prednisone pack and sent referral over to the  [facility]. Will follow up in 1-2 months to see how she is doing and if she is able to get back to PT      [place]    I have seen and examined this patient.     I have discussed the findings and exam with the MS and agree with the above note which I helped develop the management plan that is described in the MS's note, and I agree with the content.   [name], DO PGY-2 Family Medicine Resident

## 2019-06-28 NOTE — Patient Instructions (Signed)
It was a pleasure to see you today!  To summarize our discussion for this visit:  I'm happy to see you up and moving but sorry to hear youre in so much pain.  I have prescribed another steroid pack and referred to the spine center.   Please come back in a month or two when you are feeling better and we can do a regular physical and lab check up.   Some additional health maintenance measures we should update are: Health Maintenance Due  Topic Date Due  . COVID-19 Vaccine (1) Never done  . COLONOSCOPY  Never done  . MAMMOGRAM  07/01/2015  . DEXA SCAN  Never done  . PNA vac Low Risk Adult (2 of 2 - PPSV23) 07/31/2018  .    Please return to our clinic to see me when you are feeling up to it.  Call the clinic at [phone] if your symptoms worsen or you have any concerns.   Thank you for allowing me to take part in your care,  Dr. [name]

## 2019-06-28 NOTE — Assessment & Plan Note (Signed)
Her MRI on 05/18/2019 showed inflammation that is likely causing impingement of her nerves. Prescribed her another prednisone pack and sent referral over to the [place]. Will follow up in 1-2 months to see how she is doing and if she is able to get back to PT

## 2019-06-29 ENCOUNTER — Other Ambulatory Visit: Payer: Self-pay | Admitting: Physical Medicine and Rehabilitation

## 2019-06-29 DIAGNOSIS — S3282XD Multiple fractures of pelvis without disruption of pelvic ring, subsequent encounter for fracture with routine healing: Secondary | ICD-10-CM | POA: Diagnosis not present

## 2019-06-29 DIAGNOSIS — G43909 Migraine, unspecified, not intractable, without status migrainosus: Secondary | ICD-10-CM | POA: Diagnosis not present

## 2019-06-29 DIAGNOSIS — K219 Gastro-esophageal reflux disease without esophagitis: Secondary | ICD-10-CM | POA: Diagnosis not present

## 2019-06-29 DIAGNOSIS — Z86711 Personal history of pulmonary embolism: Secondary | ICD-10-CM | POA: Diagnosis not present

## 2019-06-29 DIAGNOSIS — N1831 Chronic kidney disease, stage 3a: Secondary | ICD-10-CM | POA: Diagnosis not present

## 2019-06-29 DIAGNOSIS — Z87891 Personal history of nicotine dependence: Secondary | ICD-10-CM | POA: Diagnosis not present

## 2019-06-29 DIAGNOSIS — N179 Acute kidney failure, unspecified: Secondary | ICD-10-CM | POA: Diagnosis not present

## 2019-06-29 DIAGNOSIS — K5900 Constipation, unspecified: Secondary | ICD-10-CM | POA: Diagnosis not present

## 2019-06-29 DIAGNOSIS — E871 Hypo-osmolality and hyponatremia: Secondary | ICD-10-CM | POA: Diagnosis not present

## 2019-06-29 DIAGNOSIS — W19XXXD Unspecified fall, subsequent encounter: Secondary | ICD-10-CM | POA: Diagnosis not present

## 2019-06-29 DIAGNOSIS — I129 Hypertensive chronic kidney disease with stage 1 through stage 4 chronic kidney disease, or unspecified chronic kidney disease: Secondary | ICD-10-CM | POA: Diagnosis not present

## 2019-06-29 DIAGNOSIS — E785 Hyperlipidemia, unspecified: Secondary | ICD-10-CM | POA: Diagnosis not present

## 2019-06-29 DIAGNOSIS — S3210XD Unspecified fracture of sacrum, subsequent encounter for fracture with routine healing: Secondary | ICD-10-CM | POA: Diagnosis not present

## 2019-06-29 DIAGNOSIS — Z7901 Long term (current) use of anticoagulants: Secondary | ICD-10-CM | POA: Diagnosis not present

## 2019-06-29 DIAGNOSIS — M5481 Occipital neuralgia: Secondary | ICD-10-CM | POA: Diagnosis not present

## 2019-06-29 DIAGNOSIS — S32402D Unspecified fracture of left acetabulum, subsequent encounter for fracture with routine healing: Secondary | ICD-10-CM | POA: Diagnosis not present

## 2019-06-29 DIAGNOSIS — Z8673 Personal history of transient ischemic attack (TIA), and cerebral infarction without residual deficits: Secondary | ICD-10-CM | POA: Diagnosis not present

## 2019-06-29 DIAGNOSIS — D631 Anemia in chronic kidney disease: Secondary | ICD-10-CM | POA: Diagnosis not present

## 2019-06-29 DIAGNOSIS — Z9181 History of falling: Secondary | ICD-10-CM | POA: Diagnosis not present

## 2019-06-29 DIAGNOSIS — M792 Neuralgia and neuritis, unspecified: Secondary | ICD-10-CM | POA: Diagnosis not present

## 2019-07-04 ENCOUNTER — Telehealth: Payer: Self-pay | Admitting: *Deleted

## 2019-07-04 DIAGNOSIS — N179 Acute kidney failure, unspecified: Secondary | ICD-10-CM | POA: Diagnosis not present

## 2019-07-04 DIAGNOSIS — N1831 Chronic kidney disease, stage 3a: Secondary | ICD-10-CM | POA: Diagnosis not present

## 2019-07-04 DIAGNOSIS — S3210XD Unspecified fracture of sacrum, subsequent encounter for fracture with routine healing: Secondary | ICD-10-CM | POA: Diagnosis not present

## 2019-07-04 DIAGNOSIS — S32402D Unspecified fracture of left acetabulum, subsequent encounter for fracture with routine healing: Secondary | ICD-10-CM | POA: Diagnosis not present

## 2019-07-04 DIAGNOSIS — I129 Hypertensive chronic kidney disease with stage 1 through stage 4 chronic kidney disease, or unspecified chronic kidney disease: Secondary | ICD-10-CM | POA: Diagnosis not present

## 2019-07-04 DIAGNOSIS — S3282XD Multiple fractures of pelvis without disruption of pelvic ring, subsequent encounter for fracture with routine healing: Secondary | ICD-10-CM | POA: Diagnosis not present

## 2019-07-04 NOTE — Telephone Encounter (Signed)
Received call about gabapentin from [facility] asking for  New prescription.  Last filled 05-17-19 by Dr. Letta Pate for 90 day supply.  Has appt with Debbora Presto, NP in 07-25-19 and can address then if will be filling for pt. Has appt 07-07-19 with Dr. Letta Pate. (pain management).

## 2019-07-04 NOTE — Telephone Encounter (Signed)
Gabapentin was called in by pain management on 4/23. TY.

## 2019-07-06 ENCOUNTER — Telehealth: Payer: Self-pay

## 2019-07-06 NOTE — Telephone Encounter (Signed)
Patient requesting 90 day supply for Baclofen. Is this okay?

## 2019-07-07 ENCOUNTER — Other Ambulatory Visit: Payer: Self-pay

## 2019-07-07 ENCOUNTER — Encounter: Payer: Self-pay | Admitting: Physical Medicine & Rehabilitation

## 2019-07-07 ENCOUNTER — Encounter (HOSPITAL_BASED_OUTPATIENT_CLINIC_OR_DEPARTMENT_OTHER): Payer: Medicare Other | Admitting: Physical Medicine & Rehabilitation

## 2019-07-07 VITALS — BP 116/76 | HR 68 | Ht 66.0 in | Wt 180.0 lb

## 2019-07-07 DIAGNOSIS — G894 Chronic pain syndrome: Secondary | ICD-10-CM | POA: Diagnosis not present

## 2019-07-07 DIAGNOSIS — I2584 Coronary atherosclerosis due to calcified coronary lesion: Secondary | ICD-10-CM | POA: Diagnosis not present

## 2019-07-07 DIAGNOSIS — Z5181 Encounter for therapeutic drug level monitoring: Secondary | ICD-10-CM | POA: Diagnosis not present

## 2019-07-07 DIAGNOSIS — S32810S Multiple fractures of pelvis with stable disruption of pelvic ring, sequela: Secondary | ICD-10-CM | POA: Diagnosis not present

## 2019-07-07 DIAGNOSIS — I2510 Atherosclerotic heart disease of native coronary artery without angina pectoris: Secondary | ICD-10-CM | POA: Diagnosis not present

## 2019-07-07 DIAGNOSIS — Z79891 Long term (current) use of opiate analgesic: Secondary | ICD-10-CM | POA: Diagnosis not present

## 2019-07-07 MED ORDER — OXYCODONE HCL ER 10 MG PO T12A: 10.0000 mg | EXTENDED_RELEASE_TABLET | Freq: Two times a day (BID) | ORAL | 0 refills | Status: DC

## 2019-07-07 MED ORDER — OXYCODONE HCL 5 MG PO TABS: 5.0000 mg | ORAL_TABLET | Freq: Four times a day (QID) | ORAL | 0 refills | Status: DC | PRN

## 2019-07-07 MED ORDER — BACLOFEN 5 MG PO TABS: 1.0000 | ORAL_TABLET | Freq: Three times a day (TID) | ORAL | 1 refills | Status: DC

## 2019-07-07 MED ORDER — GABAPENTIN 300 MG PO CAPS: ORAL_CAPSULE | ORAL | 0 refills | Status: DC

## 2019-07-07 NOTE — Progress Notes (Signed)
Subjective:    Patient ID: [name], female    DOB: 1950-08-18, 69 y.o.   MRN: [id number] 68 y.o. female with history of normocytic anemia, TIAs, HTN, cervical neuritis, migraines a fall 2 weeks 5 admission and pelvic fractures and poor pain control.  She was admitted on 03/06 at 22 with inability to ambulate and difficulty being managed at home.  CT of head was negative for acute changes.  CT pelvic and lumbar spine done for work-up and showed chronic compression fracture of L1 with less than 50% deformity and minimally displaced fracture of left pubis, left acetabular and left sacrum with extension to left SI joint.  Ortho was consulted for input and recommended TTWB RLE.  Gabapentin was added and titrated upwards for pain management.  IV Dilaudid was weaned off and multiple narcotics were added and titrated for better pain control.  Therapy was initiated and patient was noted to have functional deficits and mobility and ADLs.  CIR was recommended for follow-up therapy   Admit date: 05/23/2019 Discharge date: 05/29/2019 HPI No PT until WB is increased, plans to see Dr. Rolena Infante and has a second opinion scheduled at [facility] neurosurgery and spine The patient is essentially nonambulatory.  She is here in a transport chair.  She is brought here by her son.  The patient is allowed to weightbearing in the left lower extremity. Lumbar epidural injection Dr Nelva Bush- Right side ? Level pt states it helped for 1-2 days only Patient is tolerating OxyContin 10 mg twice daily as well as oxycodone 5 mg up to 4 times daily without excess sedation or constipation Pain Inventory Average Pain 10 Pain Right Now 10 My pain is sharp, burning and stabbing  In the last 24 hours, has pain interfered with the following? General activity 10 Relation with others 10 Enjoyment of life 10 What TIME of day is your pain at its worst? all Sleep (in general) Poor  Pain is worse with: walking, bending, sitting,  inactivity, standing and some activites Pain improves with: heat/ice and medication Relief from Meds: 7  Mobility use a cane use a walker ability to climb steps?  yes do you drive?  no  Function not employed: date last employed . disabled: date disabled . I need assistance with the following:  household duties and shopping  Neuro/Psych weakness trouble walking depression anxiety  Prior Studies Any changes since last visit?  no  Physicians involved in your care Primary care .   Family History  Problem Relation Age of Onset  . Cancer Mother 59       breast  . Alzheimer's disease Mother   . Prostate cancer Father   . Cancer Brother   . Hyperlipidemia Brother   . Lung cancer Father   . Dementia Maternal Grandmother   . Cancer Maternal Grandfather   . Cancer Brother   . Hyperlipidemia Brother   . Dementia Brother        frontotemporal dementia   Social History   Socioeconomic History  . Marital status: Married    Spouse name: [name]  . Number of children: 1  . Years of education: 12+  . Highest education level: High school graduate  Occupational History  . Occupation: ACTIVITY DIRECTOR    Employer: [employer]  . Occupation: CNA  Tobacco Use  . Smoking status: Former Smoker    Packs/day: 0.30    Types: Cigarettes    Quit date: 11/01/2011    Years since quitting: 7.6  .  Smokeless tobacco: Never Used  Substance and Sexual Activity  . Alcohol use: No    Alcohol/week: 0.0 standard drinks  . Drug use: No  . Sexual activity: Yes    Partners: Male    Birth control/protection: Surgical    Comment: 1 sexual partner in last 12 months: married  Other Topics Concern  . Not on file  Social History Narrative   ** Merged History Encounter **       Patient is married ([name]). Patient has one child and grandchild. Her sons name is Shanon Brow. Patient sees them often. 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 Patient  spends 6-10 hours with her 6 days a week.     Patient has a college education. Patient is very active in her church.  Hobbies- outside crafts and church choir.   Social Determinants of Health   Financial Resource Strain: Low Risk   . Difficulty of Paying Living Expenses: Not hard at all  Food Insecurity: No Food Insecurity  . Worried About Charity fundraiser in the Last Year: Never true  . Ran Out of Food in the Last Year: Never true  Transportation Needs: No Transportation Needs  . Lack of Transportation (Medical): No  . Lack of Transportation (Non-Medical): No  Physical Activity: Insufficiently Active  . Days of Exercise per Week: 7 days  . Minutes of Exercise per Session: 20 min  Stress: No Stress Concern Present  . Feeling of Stress : Only a little  Social Connections: Slightly Isolated  . Frequency of Communication with Friends and Family: Twice a week  . Frequency of Social Gatherings with Friends and Family: More than three times a week  . Attends Religious Services: 1 to 4 times per year  . Active Member of Clubs or Organizations: No  . Attends Archivist Meetings: Never  . Marital Status: Married   Past Surgical History:  Procedure Laterality Date  . ABDOMINAL HYSTERECTOMY    . BILIARY STENT PLACEMENT N/A 12/12/2015   Procedure: BILIARY STENT PLACEMENT;  Surgeon: Doran Stabler, MD;  Location: [place] [facility];  Service: Endoscopy;  Laterality: N/A;  . BLADDER REPAIR    . CARPAL TUNNEL RELEASE    . CERVICAL FUSION    . CERVICAL SPINE SURGERY    . CHOLECYSTECTOMY N/A 12/06/2015   Procedure: LAPAROSCOPIC CHOLECYSTECTOMY WITH  INTRAOPERATIVE CHOLANGIOGRAM;  Surgeon: Stark Klein, MD;  Location: [place];  Service: General;  Laterality: N/A;  . ELBOW SURGERY    . ERCP N/A 12/12/2015   Procedure: ENDOSCOPIC RETROGRADE CHOLANGIOPANCREATOGRAPHY (ERCP);  Surgeon: Doran Stabler, MD;  Location: [facility] ENDOSCOPY;  Service: Endoscopy;  Laterality: N/A;  .  ESOPHAGOGASTRODUODENOSCOPY N/A 01/28/2016   Procedure: ESOPHAGOGASTRODUODENOSCOPY (EGD) Biliary STENT removal;  Surgeon: Doran Stabler, MD;  Location: [facility];  Service: Gastroenterology;  Laterality: N/A;  . IR GENERIC HISTORICAL  12/17/2015   IR US GUIDE BX ASP/DRAIN 12/17/2015 [facility]  . IR GENERIC HISTORICAL  12/17/2015   IR GUIDED DRAIN W CATHETER PLACEMENT 12/17/2015 [facility]  . IR GENERIC HISTORICAL  12/17/2015   IR SINUS/FIST TUBE CHK-NON GI 12/17/2015 [facility]  . KNEE SURGERY    . LUNG SURGERY    . TEE WITHOUT CARDIOVERSION N/A 12/19/2015   Procedure: TRANSESOPHAGEAL ECHOCARDIOGRAM (TEE);  Surgeon: Josue Hector, MD;  Location: [facility] ENDOSCOPY;  Service: Cardiovascular;  Laterality: N/A;   Past Medical History:  Diagnosis Date  . Anemia   .  Cervical neuritis   . Chronic kidney disease    stage 3  . Dyslipidemia   . GERD (gastroesophageal reflux disease)   . History of transient ischemic attack (TIA)   . HTN (hypertension)   . Hyperlipidemia   . Migraine   . Migraine headache   . Occipital neuralgia    Left  . Pulmonary embolism ([place]) 2017  . Renal insufficiency   . Stroke ([place]) 10/2013   BP 116/76   Pulse 68   Ht 5\' 6"  (1.676 m)   Wt 180 lb (81.6 kg)   SpO2 95%   BMI 29.05 kg/m   Opioid Risk Score:   Fall Risk Score:  `1  Depression screen PHQ 2/9  Depression screen [facility] 2/9 06/28/2019 06/28/2019 06/02/2019 01/06/2019 10/08/2018 09/17/2018 09/17/2018  Decreased Interest 3 3 0 0 0 0 0  Down, Depressed, Hopeless 3 3 0 0 0 0 0  PHQ - 2 Score 6 6 0 0 0 0 0  Altered sleeping 3 - - - - - -  Tired, decreased energy 3 - - - - - -  Change in appetite 3 - - - - - -  Feeling bad or failure about yourself  2 - - - - - -  Trouble concentrating 2 - - - - - -  Moving slowly or fidgety/restless 1 - - - - - -  Suicidal thoughts 0 - - - - - -  PHQ-9 Score 20 - - - - - -  Difficult doing work/chores - - - - - - -  Some recent data might be hidden    Review  of Systems  Constitutional: Positive for appetite change and diaphoresis.  Gastrointestinal: Positive for constipation and diarrhea.  Musculoskeletal: Positive for gait problem.  Neurological: Positive for weakness.  Psychiatric/Behavioral: Positive for dysphoric mood. The patient is nervous/anxious.   All other systems reviewed and are negative.      Objective:   Physical Exam See nursing note General no acute distress Mood and affect are appropriate Speech without dysarthria or aphasia Motor strength is normal bilateral deltoid bicep tricep grip she has pain that inhibits movement in the left lower limb 3 - at the hip flexor knee extensor ankle dorsiflexor. She has tenderness palpation in the lumbosacral junction left greater than right side. Gait was not examined. Cognition intact at baseline.    Assessment & Plan:  #1.  Left complex pelvic fracture involving acetabulum sacrum and extending into sacroiliac joint.  She is still touchdown weightbearing left lower extremity.  She will see Ortho and they hopefully will advance weightbearing.  She is currently on hold from physical therapy because of her weightbearing status. Patient continues have significant pain.  As discussed with the patient I anticipate to be starting physical therapy and if so we will have a temporary increase in her pain after treatments but over the next month or so should improve.  My plan would be to repeat check patient in 1 month and potentially start weaning off of pain medication either the oxycodone versus the OxyContin.  Oral swab today for toxicology, compliance testing 2.  History of intercostal neuralgia with exacerbation.  Continue gabapentin 300 mg 2 tablets in the morning 2 at 4 PM and 3 nightly, continue lidocaine patch

## 2019-07-07 NOTE — Telephone Encounter (Signed)
Done, sent to [facility].

## 2019-07-07 NOTE — Telephone Encounter (Signed)
OK for 90d supply baclofen

## 2019-07-07 NOTE — Patient Instructions (Signed)
Will likely start weaning pain meds next month if PT gets started

## 2019-07-08 DIAGNOSIS — M5136 Other intervertebral disc degeneration, lumbar region: Secondary | ICD-10-CM | POA: Diagnosis not present

## 2019-07-08 DIAGNOSIS — M519 Unspecified thoracic, thoracolumbar and lumbosacral intervertebral disc disorder: Secondary | ICD-10-CM | POA: Diagnosis not present

## 2019-07-08 DIAGNOSIS — M5441 Lumbago with sciatica, right side: Secondary | ICD-10-CM | POA: Diagnosis not present

## 2019-07-08 DIAGNOSIS — M48062 Spinal stenosis, lumbar region with neurogenic claudication: Secondary | ICD-10-CM | POA: Diagnosis not present

## 2019-07-08 DIAGNOSIS — M545 Low back pain: Secondary | ICD-10-CM | POA: Diagnosis not present

## 2019-07-08 DIAGNOSIS — M419 Scoliosis, unspecified: Secondary | ICD-10-CM | POA: Diagnosis not present

## 2019-07-08 DIAGNOSIS — R200 Anesthesia of skin: Secondary | ICD-10-CM | POA: Diagnosis not present

## 2019-07-08 DIAGNOSIS — M5442 Lumbago with sciatica, left side: Secondary | ICD-10-CM | POA: Diagnosis not present

## 2019-07-08 DIAGNOSIS — R292 Abnormal reflex: Secondary | ICD-10-CM | POA: Diagnosis not present

## 2019-07-09 ENCOUNTER — Other Ambulatory Visit: Payer: Self-pay | Admitting: Student in an Organized Health Care Education/Training Program

## 2019-07-11 ENCOUNTER — Telehealth: Payer: Self-pay

## 2019-07-11 ENCOUNTER — Other Ambulatory Visit: Payer: Self-pay | Admitting: Neurosurgery

## 2019-07-11 DIAGNOSIS — M5442 Lumbago with sciatica, left side: Secondary | ICD-10-CM

## 2019-07-11 DIAGNOSIS — M542 Cervicalgia: Secondary | ICD-10-CM

## 2019-07-11 DIAGNOSIS — M5441 Lumbago with sciatica, right side: Secondary | ICD-10-CM

## 2019-07-11 LAB — DRUG TOX MONITOR 1 W/CONF, ORAL FLD
Amphetamines: NEGATIVE ng/mL (ref <10)
Barbiturates: NEGATIVE ng/mL (ref <10)
Benzodiazepines: NEGATIVE ng/mL (ref <0.50)
Buprenorphine: NEGATIVE ng/mL (ref <0.10)
Cocaine: NEGATIVE ng/mL (ref <5.0)
Codeine: NEGATIVE ng/mL (ref <2.5)
Dihydrocodeine: NEGATIVE ng/mL (ref <2.5)
Fentanyl: NEGATIVE ng/mL (ref <0.10)
Heroin Metabolite: NEGATIVE ng/mL (ref <1.0)
Hydrocodone: NEGATIVE ng/mL (ref <2.5)
Hydromorphone: NEGATIVE ng/mL (ref <2.5)
MARIJUANA: NEGATIVE ng/mL (ref <2.5)
MDMA: NEGATIVE ng/mL (ref <10)
Meprobamate: NEGATIVE ng/mL (ref <2.5)
Methadone: NEGATIVE ng/mL (ref <5.0)
Morphine: NEGATIVE ng/mL (ref <2.5)
Nicotine Metabolite: NEGATIVE ng/mL (ref <5.0)
Norhydrocodone: NEGATIVE ng/mL (ref <2.5)
Noroxycodone: 30.5 ng/mL — ABNORMAL HIGH (ref <2.5)
Opiates: POSITIVE ng/mL — AB (ref <2.5)
Oxycodone: 32.1 ng/mL — ABNORMAL HIGH (ref <2.5)
Oxymorphone: NEGATIVE ng/mL (ref <2.5)
Phencyclidine: NEGATIVE ng/mL (ref <10)
Tapentadol: NEGATIVE ng/mL (ref <5.0)
Tramadol: NEGATIVE ng/mL (ref <5.0)
Zolpidem: NEGATIVE ng/mL (ref <5.0)

## 2019-07-11 LAB — DRUG TOX ALC METAB W/CON, ORAL FLD: Alcohol Metabolite: NEGATIVE ng/mL (ref <25)

## 2019-07-11 MED ORDER — ZONISAMIDE 25 MG PO CAPS: ORAL_CAPSULE | ORAL | 1 refills | Status: DC

## 2019-07-11 NOTE — Telephone Encounter (Signed)
Received a a rx request from [facility] requesting 90 day supply od Tizanidine 2 mg TID. Is this something that you want to take over prescribing?

## 2019-07-11 NOTE — Addendum Note (Signed)
Addended by: Elita Quick on: 07/11/2019 02:45 PM   Modules accepted: Orders

## 2019-07-11 NOTE — Telephone Encounter (Signed)
Patient returned my call for me to screen her medications and drug allergies before being scheduled for a myelogram.  She was informed she will be here two hours, will need a driver and will need to be on strict bedrest for 24 hours after the procedure.  She also was informed she will need to hold Sertraline/Zoloft for 48 hours before, and 24 hours after, the myelogram.

## 2019-07-11 NOTE — Telephone Encounter (Signed)
Spoke to pt and she is agreeable to this plan.

## 2019-07-11 NOTE — Telephone Encounter (Signed)
Does she have an upcoming appt? We need to discuss. She is also taking baclofen with pain management and I don't know if she should be taking both. I am more than happy to talk to her about options.

## 2019-07-12 DIAGNOSIS — S3282XD Multiple fractures of pelvis without disruption of pelvic ring, subsequent encounter for fracture with routine healing: Secondary | ICD-10-CM | POA: Diagnosis not present

## 2019-07-12 DIAGNOSIS — N1831 Chronic kidney disease, stage 3a: Secondary | ICD-10-CM | POA: Diagnosis not present

## 2019-07-12 DIAGNOSIS — N179 Acute kidney failure, unspecified: Secondary | ICD-10-CM | POA: Diagnosis not present

## 2019-07-12 DIAGNOSIS — S32402D Unspecified fracture of left acetabulum, subsequent encounter for fracture with routine healing: Secondary | ICD-10-CM | POA: Diagnosis not present

## 2019-07-12 DIAGNOSIS — S3210XD Unspecified fracture of sacrum, subsequent encounter for fracture with routine healing: Secondary | ICD-10-CM | POA: Diagnosis not present

## 2019-07-12 DIAGNOSIS — I129 Hypertensive chronic kidney disease with stage 1 through stage 4 chronic kidney disease, or unspecified chronic kidney disease: Secondary | ICD-10-CM | POA: Diagnosis not present

## 2019-07-13 DIAGNOSIS — M79604 Pain in right leg: Secondary | ICD-10-CM | POA: Diagnosis not present

## 2019-07-13 DIAGNOSIS — M545 Low back pain: Secondary | ICD-10-CM | POA: Diagnosis not present

## 2019-07-13 DIAGNOSIS — R2689 Other abnormalities of gait and mobility: Secondary | ICD-10-CM | POA: Diagnosis not present

## 2019-07-13 DIAGNOSIS — M79605 Pain in left leg: Secondary | ICD-10-CM | POA: Diagnosis not present

## 2019-07-18 ENCOUNTER — Other Ambulatory Visit: Payer: Self-pay

## 2019-07-18 ENCOUNTER — Ambulatory Visit
Admission: RE | Admit: 2019-07-18 | Discharge: 2019-07-18 | Disposition: A | Discharge status: Home / Self Care | Payer: Medicare Other | Source: Ambulatory Visit | Attending: Neurosurgery | Admitting: Neurosurgery

## 2019-07-18 ENCOUNTER — Other Ambulatory Visit: Payer: Medicare Other

## 2019-07-18 DIAGNOSIS — M542 Cervicalgia: Secondary | ICD-10-CM

## 2019-07-18 DIAGNOSIS — M5442 Lumbago with sciatica, left side: Secondary | ICD-10-CM

## 2019-07-18 DIAGNOSIS — M5126 Other intervertebral disc displacement, lumbar region: Secondary | ICD-10-CM | POA: Diagnosis not present

## 2019-07-18 DIAGNOSIS — M5441 Lumbago with sciatica, right side: Secondary | ICD-10-CM

## 2019-07-18 DIAGNOSIS — M4802 Spinal stenosis, cervical region: Secondary | ICD-10-CM | POA: Diagnosis not present

## 2019-07-18 DIAGNOSIS — M48061 Spinal stenosis, lumbar region without neurogenic claudication: Secondary | ICD-10-CM | POA: Diagnosis not present

## 2019-07-18 MED ORDER — IOPAMIDOL (ISOVUE-M 300) INJECTION 61%
10.0000 mL | Freq: Once | INTRAMUSCULAR | Status: AC | PRN
  Administered 2019-07-18: 10:00:00 10 mL via INTRATHECAL

## 2019-07-18 MED ORDER — MEPERIDINE HCL 50 MG/ML IJ SOLN: 50.0000 mg | Freq: Once | INTRAMUSCULAR | Status: DC

## 2019-07-18 MED ORDER — DIAZEPAM 5 MG PO TABS
5.0000 mg | ORAL_TABLET | Freq: Once | ORAL | Status: AC
  Administered 2019-07-18: 5 mg via ORAL

## 2019-07-18 MED ORDER — ONDANSETRON HCL 4 MG/2ML IJ SOLN: 4.0000 mg | Freq: Once | INTRAMUSCULAR | Status: DC

## 2019-07-18 NOTE — Discharge Instructions (Signed)
Myelogram Discharge Instructions  1. Go home and rest quietly for the next 24 hours.  It is important to lie flat for the next 24 hours.  Get up only to go to the restroom.  You may lie in the bed or on a couch on your back, your stomach, your left side or your right side.  You may have one pillow under your head.  You may have pillows between your knees while you are on your side or under your knees while you are on your back.  2. DO NOT drive today.  Recline the seat as far back as it will go, while still wearing your seat belt, on the way home.  3. You may get up to go to the bathroom as needed.  You may sit up for 10 minutes to eat.  You may resume your normal diet and medications unless otherwise indicated.  Drink lots of extra fluids today and tomorrow.  4. The incidence of headache, nausea, or vomiting is about 5% (one in 20 patients).  If you develop a headache, lie flat and drink plenty of fluids until the headache goes away.  Caffeinated beverages may be helpful.  If you develop severe nausea and vomiting or a headache that does not go away with flat bed rest, call [phone].  5. You may resume normal activities after your 24 hours of bed rest is over; however, do not exert yourself strongly or do any heavy lifting tomorrow. If when you get up you have a headache when standing, go back to bed and force fluids for another 24 hours.  6. Call your physician for a follow-up appointment.  The results of your myelogram will be sent directly to your physician by the following day.  7. If you have any questions or if complications develop after you arrive home, please call [phone].  Discharge instructions have been explained to the patient.  The patient, or the person responsible for the patient, fully understands these instructions.  YOU MAY RESTART YOUR ZOLOFT TOMORROW 07/19/19 AT 09:00AM.

## 2019-07-19 ENCOUNTER — Telehealth: Payer: Self-pay | Admitting: Student in an Organized Health Care Education/Training Program

## 2019-07-19 DIAGNOSIS — N1831 Chronic kidney disease, stage 3a: Secondary | ICD-10-CM | POA: Diagnosis not present

## 2019-07-19 DIAGNOSIS — S3210XD Unspecified fracture of sacrum, subsequent encounter for fracture with routine healing: Secondary | ICD-10-CM | POA: Diagnosis not present

## 2019-07-19 DIAGNOSIS — I129 Hypertensive chronic kidney disease with stage 1 through stage 4 chronic kidney disease, or unspecified chronic kidney disease: Secondary | ICD-10-CM | POA: Diagnosis not present

## 2019-07-19 DIAGNOSIS — S32402D Unspecified fracture of left acetabulum, subsequent encounter for fracture with routine healing: Secondary | ICD-10-CM | POA: Diagnosis not present

## 2019-07-19 DIAGNOSIS — N179 Acute kidney failure, unspecified: Secondary | ICD-10-CM | POA: Diagnosis not present

## 2019-07-19 DIAGNOSIS — S3282XD Multiple fractures of pelvis without disruption of pelvic ring, subsequent encounter for fracture with routine healing: Secondary | ICD-10-CM | POA: Diagnosis not present

## 2019-07-19 NOTE — Telephone Encounter (Signed)
Monte Fantasia from [facility] is requesting a call back "Stat" urgent regarding patient Ph # [phone]

## 2019-07-20 ENCOUNTER — Other Ambulatory Visit: Payer: Self-pay

## 2019-07-20 ENCOUNTER — Encounter: Payer: Self-pay | Admitting: Family Medicine

## 2019-07-20 ENCOUNTER — Telehealth: Payer: Self-pay

## 2019-07-20 ENCOUNTER — Ambulatory Visit (INDEPENDENT_AMBULATORY_CARE_PROVIDER_SITE_OTHER): Payer: Medicare Other | Admitting: Family Medicine

## 2019-07-20 ENCOUNTER — Telehealth: Payer: Self-pay | Admitting: *Deleted

## 2019-07-20 VITALS — BP 142/82 | HR 74 | Temp 99.2°F | Ht 66.0 in | Wt 196.0 lb

## 2019-07-20 DIAGNOSIS — M7989 Other specified soft tissue disorders: Secondary | ICD-10-CM | POA: Diagnosis not present

## 2019-07-20 DIAGNOSIS — I2510 Atherosclerotic heart disease of native coronary artery without angina pectoris: Secondary | ICD-10-CM

## 2019-07-20 DIAGNOSIS — I2584 Coronary atherosclerosis due to calcified coronary lesion: Secondary | ICD-10-CM

## 2019-07-20 NOTE — Telephone Encounter (Signed)
Attempted to get in touch with patient concerning possible DVT.  No answer.  LVM again advising patient to go to ED or come here for evaluation.  Ozella Almond, [place]

## 2019-07-20 NOTE — Telephone Encounter (Signed)
Prior authorization submitted via covermymeds for lidocaine 5% patches.  Approved 05/08/2019 - 06/06/2020

## 2019-07-20 NOTE — Telephone Encounter (Signed)
Called Kellie at [facility] to inform her that patient called and made an appointment.  Ozella Almond, CMA\

## 2019-07-20 NOTE — Telephone Encounter (Signed)
Called Monte Fantasia at [employer] [facility].  Informed her that I have not been able to get in touch with patient.  Kellie voices that she does not have another visit with patient until next week.  Ozella Almond, [place]

## 2019-07-20 NOTE — Telephone Encounter (Signed)
Called Monte Fantasia at [facility].  No answer.  LVM to call office with concerns.  Ozella Almond, [place]

## 2019-07-20 NOTE — Progress Notes (Signed)
    SUBJECTIVE:   CHIEF COMPLAINT / HPI:   Leg Swelling Patient returns to clinic today due to acute on chronic leg swelling. She states it started on Sunday and has remained relatively the same, not getting better, not getting much worse. She has had this before. She has not being wearing compression stockings. She is not having shortness of breath or difficulty breathing, or dyspnea on exertion. Her legs are not red, warm, or painful. No falls and mild difficulty walking with the swelling. Her weight is up about 10lbs from dry weight.  PERTINENT  PMH / PSH: HTN, PE, PVD, TIA, GERD  OBJECTIVE:   BP (!) 142/82   Pulse 74   Temp 99.2 F (37.3 C)   Ht 5\' 6"  (1.676 m)   Wt 196 lb (88.9 kg)   SpO2 94%   BMI 31.64 kg/m   Gen: NAD Cardio: RRR, no murmurs Resp: CTAB, no crackles Skin: no rashes Ext: +3 pitting edema, no warmth, no erythema, no calf tenderness bilaterally  ASSESSMENT/PLAN:   Leg swelling Acute on chronic. Stable. Patient is up on her dry weight and has not been using her compression stockings. Recent echo from 01/2019 shows normal LVEF and grade I diastolic dysfunction, no symptoms of acute HF exacerbation.  - Will check BMP - Suspect pedal edema from venous stasis but may have a component of volume increase due to kidney disease - Compression stockings and increase lasix from 40mg  daily to 80mg  daily. Will monitor closely as she has had hypotension in the past as well as AKIs - F/u in one week with me in clinic     Nuala Alpha, [place]

## 2019-07-20 NOTE — Telephone Encounter (Signed)
Spoke to [employer] from [employer].  On a routine home vist to patient, the following was of concern:  Patient had myelogram on 11/18/2019.  Noticeable new onset edema in bilateral legs. Bilateral legs "very tight" Terrible cough Temperature different in Right leg than left.  Patient was called with no answer.  Voicemail was left to have patient call office.  Patient refused to go to ED on Kellie's advice and said that she was just going to take more lasix to see if that would relieve her swelling.  Will continue to reach out to patient as well as route findings to PCP.  Ozella Almond, [place]

## 2019-07-20 NOTE — Patient Instructions (Signed)
It was great to see you today! Thank you for letting me participate in your care!  Today, we discussed your leg swelling. Please increase your lasix dose to two pills per day for the next several days and continue to weigh yourself. Please take two pills for the next 3-5 days. Please decrease and limit your fluid intake to less than 1L per day. Avoid salty foods. Please wear your compression stockings and elevate your feet at night.  Be well, Harolyn Rutherford, DO PGY-3, [facility]

## 2019-07-21 DIAGNOSIS — M545 Low back pain: Secondary | ICD-10-CM | POA: Diagnosis not present

## 2019-07-21 DIAGNOSIS — M5136 Other intervertebral disc degeneration, lumbar region: Secondary | ICD-10-CM | POA: Diagnosis not present

## 2019-07-21 LAB — BASIC METABOLIC PANEL
BUN/Creatinine Ratio: 10 — ABNORMAL LOW (ref 12–28)
BUN: 12 mg/dL (ref 8–27)
CO2: 30 mmol/L — ABNORMAL HIGH (ref 20–29)
Calcium: 8.9 mg/dL (ref 8.7–10.3)
Chloride: 103 mmol/L (ref 96–106)
Creatinine, Ser: 1.26 mg/dL — ABNORMAL HIGH (ref 0.57–1.00)
GFR calc Af Amer: 51 mL/min/{1.73_m2} — ABNORMAL LOW (ref >59)
GFR calc non Af Amer: 44 mL/min/{1.73_m2} — ABNORMAL LOW (ref >59)
Glucose: 120 mg/dL — ABNORMAL HIGH (ref 65–99)
Potassium: 3.6 mmol/L (ref 3.5–5.2)
Sodium: 144 mmol/L (ref 134–144)

## 2019-07-22 ENCOUNTER — Telehealth: Payer: Self-pay | Admitting: *Deleted

## 2019-07-22 NOTE — Telephone Encounter (Signed)
Oral swab drug screen was consistent for prescribed medications.  ?

## 2019-07-25 ENCOUNTER — Ambulatory Visit: Payer: Medicare Other | Admitting: Family Medicine

## 2019-07-25 DIAGNOSIS — M8448XG Pathological fracture, other site, subsequent encounter for fracture with delayed healing: Secondary | ICD-10-CM | POA: Diagnosis not present

## 2019-07-25 DIAGNOSIS — M4712 Other spondylosis with myelopathy, cervical region: Secondary | ICD-10-CM | POA: Diagnosis not present

## 2019-07-25 DIAGNOSIS — M4802 Spinal stenosis, cervical region: Secondary | ICD-10-CM | POA: Diagnosis not present

## 2019-07-25 DIAGNOSIS — M48062 Spinal stenosis, lumbar region with neurogenic claudication: Secondary | ICD-10-CM | POA: Diagnosis not present

## 2019-07-25 NOTE — Assessment & Plan Note (Signed)
Acute on chronic. Stable. Patient is up on her dry weight and has not been using her compression stockings. Recent echo from 01/2019 shows normal LVEF and grade I diastolic dysfunction, no symptoms of acute HF exacerbation.  - Will check BMP - Suspect pedal edema from venous stasis but may have a component of volume increase due to kidney disease - Compression stockings and increase lasix from 40mg  daily to 80mg  daily. Will monitor closely as she has had hypotension in the past as well as AKIs - F/u in one week with me in clinic

## 2019-07-26 ENCOUNTER — Other Ambulatory Visit: Payer: Self-pay

## 2019-07-26 ENCOUNTER — Other Ambulatory Visit: Payer: Self-pay | Admitting: Physical Medicine & Rehabilitation

## 2019-07-26 ENCOUNTER — Telehealth: Payer: Self-pay

## 2019-07-26 DIAGNOSIS — S3282XD Multiple fractures of pelvis without disruption of pelvic ring, subsequent encounter for fracture with routine healing: Secondary | ICD-10-CM | POA: Diagnosis not present

## 2019-07-26 DIAGNOSIS — N1831 Chronic kidney disease, stage 3a: Secondary | ICD-10-CM | POA: Diagnosis not present

## 2019-07-26 DIAGNOSIS — M519 Unspecified thoracic, thoracolumbar and lumbosacral intervertebral disc disorder: Secondary | ICD-10-CM | POA: Diagnosis not present

## 2019-07-26 DIAGNOSIS — I129 Hypertensive chronic kidney disease with stage 1 through stage 4 chronic kidney disease, or unspecified chronic kidney disease: Secondary | ICD-10-CM | POA: Diagnosis not present

## 2019-07-26 DIAGNOSIS — M48062 Spinal stenosis, lumbar region with neurogenic claudication: Secondary | ICD-10-CM | POA: Diagnosis not present

## 2019-07-26 DIAGNOSIS — S3210XD Unspecified fracture of sacrum, subsequent encounter for fracture with routine healing: Secondary | ICD-10-CM | POA: Diagnosis not present

## 2019-07-26 DIAGNOSIS — N179 Acute kidney failure, unspecified: Secondary | ICD-10-CM | POA: Diagnosis not present

## 2019-07-26 DIAGNOSIS — S32402D Unspecified fracture of left acetabulum, subsequent encounter for fracture with routine healing: Secondary | ICD-10-CM | POA: Diagnosis not present

## 2019-07-26 MED ORDER — PANTOPRAZOLE SODIUM 40 MG PO TBEC: 40.0000 mg | DELAYED_RELEASE_TABLET | Freq: Every day | ORAL | 6 refills | Status: DC

## 2019-07-26 NOTE — Telephone Encounter (Signed)
[name] from [facility] calling for [name] verbal orders as follows:  1 time(s) weekly for 9 week(s)  You can leave verbal orders on confidential voicemail.  [phone]

## 2019-07-26 NOTE — Telephone Encounter (Signed)
This is Dr. Comfort's pt.  °

## 2019-07-27 DIAGNOSIS — S32599D Other specified fracture of unspecified pubis, subsequent encounter for fracture with routine healing: Secondary | ICD-10-CM | POA: Diagnosis not present

## 2019-07-28 DIAGNOSIS — M79605 Pain in left leg: Secondary | ICD-10-CM | POA: Diagnosis not present

## 2019-07-28 DIAGNOSIS — M545 Low back pain: Secondary | ICD-10-CM | POA: Diagnosis not present

## 2019-07-28 DIAGNOSIS — R2689 Other abnormalities of gait and mobility: Secondary | ICD-10-CM | POA: Diagnosis not present

## 2019-07-28 DIAGNOSIS — M79604 Pain in right leg: Secondary | ICD-10-CM | POA: Diagnosis not present

## 2019-07-29 ENCOUNTER — Ambulatory Visit: Payer: Medicare Other | Admitting: Family Medicine

## 2019-07-31 ENCOUNTER — Encounter (HOSPITAL_COMMUNITY): Payer: Self-pay | Admitting: Emergency Medicine

## 2019-07-31 ENCOUNTER — Emergency Department (HOSPITAL_COMMUNITY)
Admission: EM | Admit: 2019-07-31 | Discharge: 2019-07-31 | Disposition: A | Discharge status: Home / Self Care | Payer: Medicare Other | Attending: Emergency Medicine | Admitting: Emergency Medicine

## 2019-07-31 ENCOUNTER — Emergency Department (HOSPITAL_BASED_OUTPATIENT_CLINIC_OR_DEPARTMENT_OTHER): Payer: Medicare Other

## 2019-07-31 ENCOUNTER — Other Ambulatory Visit: Payer: Self-pay

## 2019-07-31 ENCOUNTER — Emergency Department (HOSPITAL_COMMUNITY): Payer: Medicare Other

## 2019-07-31 DIAGNOSIS — R062 Wheezing: Secondary | ICD-10-CM | POA: Insufficient documentation

## 2019-07-31 DIAGNOSIS — R609 Edema, unspecified: Secondary | ICD-10-CM | POA: Diagnosis not present

## 2019-07-31 DIAGNOSIS — E876 Hypokalemia: Secondary | ICD-10-CM | POA: Insufficient documentation

## 2019-07-31 DIAGNOSIS — I129 Hypertensive chronic kidney disease with stage 1 through stage 4 chronic kidney disease, or unspecified chronic kidney disease: Secondary | ICD-10-CM | POA: Insufficient documentation

## 2019-07-31 DIAGNOSIS — R600 Localized edema: Secondary | ICD-10-CM

## 2019-07-31 DIAGNOSIS — R0600 Dyspnea, unspecified: Secondary | ICD-10-CM

## 2019-07-31 DIAGNOSIS — N183 Chronic kidney disease, stage 3 unspecified: Secondary | ICD-10-CM | POA: Insufficient documentation

## 2019-07-31 DIAGNOSIS — Z7901 Long term (current) use of anticoagulants: Secondary | ICD-10-CM | POA: Insufficient documentation

## 2019-07-31 DIAGNOSIS — R635 Abnormal weight gain: Secondary | ICD-10-CM | POA: Insufficient documentation

## 2019-07-31 DIAGNOSIS — R0602 Shortness of breath: Secondary | ICD-10-CM | POA: Diagnosis not present

## 2019-07-31 DIAGNOSIS — Z86711 Personal history of pulmonary embolism: Secondary | ICD-10-CM | POA: Diagnosis not present

## 2019-07-31 DIAGNOSIS — R9431 Abnormal electrocardiogram [ECG] [EKG]: Secondary | ICD-10-CM | POA: Diagnosis not present

## 2019-07-31 DIAGNOSIS — Z87891 Personal history of nicotine dependence: Secondary | ICD-10-CM | POA: Diagnosis not present

## 2019-07-31 LAB — BASIC METABOLIC PANEL
Anion gap: 14 (ref 5–15)
BUN: 14 mg/dL (ref 8–23)
CO2: 26 mmol/L (ref 22–32)
Calcium: 9 mg/dL (ref 8.9–10.3)
Chloride: 103 mmol/L (ref 98–111)
Creatinine, Ser: 1.17 mg/dL — ABNORMAL HIGH (ref 0.44–1.00)
GFR calc Af Amer: 56 mL/min — ABNORMAL LOW (ref >60)
GFR calc non Af Amer: 48 mL/min — ABNORMAL LOW (ref >60)
Glucose, Bld: 121 mg/dL — ABNORMAL HIGH (ref 70–99)
Potassium: 3.3 mmol/L — ABNORMAL LOW (ref 3.5–5.1)
Sodium: 143 mmol/L (ref 135–145)

## 2019-07-31 LAB — CBC
HCT: 37.5 % (ref 36.0–46.0)
Hemoglobin: 11.4 g/dL — ABNORMAL LOW (ref 12.0–15.0)
MCH: 27 pg (ref 26.0–34.0)
MCHC: 30.4 g/dL (ref 30.0–36.0)
MCV: 88.7 fL (ref 80.0–100.0)
Platelets: 273 10*3/uL (ref 150–400)
RBC: 4.23 MIL/uL (ref 3.87–5.11)
RDW: 16.2 % — ABNORMAL HIGH (ref 11.5–15.5)
WBC: 5.6 10*3/uL (ref 4.0–10.5)
nRBC: 0 % (ref 0.0–0.2)

## 2019-07-31 LAB — HEPATIC FUNCTION PANEL
ALT: 20 U/L (ref 0–44)
AST: 26 U/L (ref 15–41)
Albumin: 3.9 g/dL (ref 3.5–5.0)
Alkaline Phosphatase: 160 U/L — ABNORMAL HIGH (ref 38–126)
Bilirubin, Direct: 0.1 mg/dL (ref 0.0–0.2)
Indirect Bilirubin: 0.1 mg/dL — ABNORMAL LOW (ref 0.3–0.9)
Total Bilirubin: 0.2 mg/dL — ABNORMAL LOW (ref 0.3–1.2)
Total Protein: 6.7 g/dL (ref 6.5–8.1)

## 2019-07-31 LAB — TROPONIN I (HIGH SENSITIVITY)
Troponin I (High Sensitivity): 6 ng/L (ref <18)
Troponin I (High Sensitivity): 7 ng/L (ref <18)

## 2019-07-31 LAB — BRAIN NATRIURETIC PEPTIDE: B Natriuretic Peptide: 66.3 pg/mL (ref 0.0–100.0)

## 2019-07-31 LAB — MAGNESIUM: Magnesium: 2.1 mg/dL (ref 1.7–2.4)

## 2019-07-31 MED ORDER — POTASSIUM CHLORIDE CRYS ER 20 MEQ PO TBCR
40.0000 meq | EXTENDED_RELEASE_TABLET | Freq: Once | ORAL | Status: AC
  Administered 2019-07-31: 40 meq via ORAL
  Filled 2019-07-31: qty 2

## 2019-07-31 MED ORDER — IOHEXOL 350 MG/ML SOLN
75.0000 mL | Freq: Once | INTRAVENOUS | Status: AC | PRN
  Administered 2019-07-31: 75 mL via INTRAVENOUS

## 2019-07-31 MED ORDER — OXYCODONE-ACETAMINOPHEN 5-325 MG PO TABS
2.0000 | ORAL_TABLET | Freq: Once | ORAL | Status: AC
  Administered 2019-07-31: 2 via ORAL
  Filled 2019-07-31: qty 2

## 2019-07-31 MED ORDER — SODIUM CHLORIDE 0.9% FLUSH
3.0000 mL | Freq: Once | INTRAVENOUS | Status: AC
  Administered 2019-07-31: 3 mL via INTRAVENOUS

## 2019-07-31 NOTE — ED Provider Notes (Signed)
[facility] EMERGENCY DEPARTMENT Provider Note   CSN: [id number] Arrival date & time: 07/31/19  1506     History Chief Complaint  Patient presents with   Shortness of Breath   Leg Pain    [name] is a 68 y.o. female.  Patient with history of anemia, reflux, TIA, high blood pressure, on Plavix, pulmonary embolism not currently on anticoagulation that happened 2 years ago, pleural effusion, deconditioning, peripheral vascular disease, recent fall in February presents with worsening diffuse swelling and weight gain 20 pounds over the past 2 weeks.  Patient's legs and abdomen are very edematous.  No known liver disease, no alcohol use.  Patient denies any known cardiac history or heart failure history.  Patient was seen by Dr. Recently and she has been taking Lasix twice a day with no improvement.  Mild shortness of breath not specifically with exertion.        Past Medical History:  Diagnosis Date   Anemia    Cervical neuritis    Chronic kidney disease    stage 3   Dyslipidemia    GERD (gastroesophageal reflux disease)    History of transient ischemic attack (TIA)    HTN (hypertension)    Hyperlipidemia    Migraine    Migraine headache    Occipital neuralgia    Left   Pulmonary embolism ([place]) 2017   Renal insufficiency    Stroke ([place]) 10/2013    Patient Active Problem List   Diagnosis Date Noted   Flushing 06/05/2019   Hypotension 06/03/2019   Pain of left hip joint 05/31/2019   Multiple pelvic fractures ([place]) 05/23/2019   Muscular deconditioning    Frequent falls    Nondisplaced fracture of anterior wall of left acetabulum    Fracture of multiple pubic rami, left 05/14/2019   Chronic cough 01/05/2019   Fatigue 11/04/2018   Exposure to COVID-19 virus 10/05/2018   Pre-operative clearance 04/21/2018   PVD (peripheral vascular disease) ([place]) 04/20/2018   CRI (chronic renal insufficiency), stage 3 (moderate)  04/20/2018   Spinal stenosis of lumbar region 04/14/2018   History of cerebrovascular accident 04/14/2018   Headache disorder 04/14/2018   Degeneration of lumbar intervertebral disc 04/14/2018   Anxiety 04/14/2018   Pyelonephritis 0000000   Complicated UTI (urinary tract infection) 04/04/2018   Plantar fasciitis 01/10/2018   Chronic anxiety 01/08/2018   Gastroesophageal reflux disease 01/08/2018   Menopausal syndrome 01/08/2018   Leg swelling 08/24/2017   Pulmonary embolism ([place])    Coronary artery calcification    Dyspnea 07/26/2017   Osteoarthritis of knee 04/08/2017   Status post cholecystectomy    Hyperlipidemia LDL goal <70 04/02/2015   Chronic migraine without aura, with status migrainosus 09/26/2014   Migraine variant 10/10/2013   TIA (transient ischemic attack) 10/10/2013   Essential hypertension 07/18/2013   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   Neuralgia, neuritis, and radiculitis, unspecified 02/04/2012   Myofascial muscle pain 07/15/2011    Past Surgical History:  Procedure Laterality Date   ABDOMINAL HYSTERECTOMY     BILIARY STENT PLACEMENT N/A 12/12/2015   Procedure: BILIARY STENT PLACEMENT;  Surgeon: Doran Stabler, MD;  Location: [facility] ENDOSCOPY;  Service: Endoscopy;  Laterality: N/A;   BLADDER REPAIR     CARPAL TUNNEL RELEASE     CERVICAL FUSION     CERVICAL SPINE SURGERY     CHOLECYSTECTOMY N/A 12/06/2015   Procedure: LAPAROSCOPIC CHOLECYSTECTOMY WITH  INTRAOPERATIVE CHOLANGIOGRAM;  Surgeon: Stark Klein, MD;  Location: [facility];  Service: General;  Laterality: N/A;   ELBOW SURGERY     ERCP N/A 12/12/2015   Procedure: ENDOSCOPIC RETROGRADE CHOLANGIOPANCREATOGRAPHY (ERCP);  Surgeon: Doran Stabler, MD;  Location: [facility] ENDOSCOPY;  Service: Endoscopy;  Laterality: N/A;   ESOPHAGOGASTRODUODENOSCOPY N/A 01/28/2016   Procedure: ESOPHAGOGASTRODUODENOSCOPY (EGD) Biliary STENT removal;  Surgeon: Doran Stabler, MD;   Location: [facility] ENDOSCOPY;  Service: Gastroenterology;  Laterality: N/A;   IR GENERIC HISTORICAL  12/17/2015   IR US GUIDE BX ASP/DRAIN 12/17/2015 [facility]   IR GENERIC HISTORICAL  12/17/2015   IR GUIDED DRAIN W CATHETER PLACEMENT 12/17/2015 [facility]   IR GENERIC HISTORICAL  12/17/2015   IR SINUS/FIST TUBE CHK-NON GI 12/17/2015 [facility]   KNEE SURGERY     LUNG SURGERY     TEE WITHOUT CARDIOVERSION N/A 12/19/2015   Procedure: TRANSESOPHAGEAL ECHOCARDIOGRAM (TEE);  Surgeon: Josue Hector, MD;  Location: [facility] ENDOSCOPY;  Service: Cardiovascular;  Laterality: N/A;     OB History   No obstetric history on file.     Family History  Problem Relation Age of Onset   Cancer Mother 89       breast   Alzheimer's disease Mother    Prostate cancer Father    Cancer Brother    Hyperlipidemia Brother    Lung cancer Father    Dementia Maternal Grandmother    Cancer Maternal Grandfather    Cancer Brother    Hyperlipidemia Brother    Dementia Brother        frontotemporal dementia    Social History   Tobacco Use   Smoking status: Former Smoker    Packs/day: 0.30    Types: Cigarettes    Quit date: 11/01/2011    Years since quitting: 7.7   Smokeless tobacco: Never Used  Substance Use Topics   Alcohol use: No    Alcohol/week: 0.0 standard drinks   Drug use: No    Home Medications Prior to Admission medications   Medication Sig Start Date End Date Taking? Authorizing Provider  acetaminophen (TYLENOL) 325 MG tablet Take 2 tablets (650 mg total) by mouth 4 (four) times daily -  with meals and at bedtime. 05/27/19   Love, Ivan Anchors, PA-C  Alirocumab (PRALUENT) 75 MG/ML SOAJ Inject 75 mg into the skin every 14 (fourteen) days. 06/22/19   Skeet Latch, MD  amLODipine (NORVASC) 2.5 MG tablet Take 1 tablet (2.5 mg total) by mouth daily. CALL OFFICE FOR APPOINTMENT 06/01/19   Skeet Latch, MD  Baclofen 5 MG TABS Take 1 tablet by mouth 3 (three) times daily.  07/07/19   Kirsteins, Luanna Salk, MD  calcium carbonate (OS-CAL - DOSED IN MG OF ELEMENTAL CALCIUM) 1250 (500 Ca) MG tablet Take 1 tablet (500 mg of elemental calcium total) by mouth daily with breakfast. 05/23/19   Lattie Haw, MD  cetirizine (ZYRTEC) 10 MG tablet Take 10 mg by mouth daily with breakfast.    [provider]  clopidogrel (PLAVIX) 75 MG tablet TAKE 1 TABLET(75 MG) BY MOUTH DAILY 07/11/19   Anderson, Chelsey L, DO  diclofenac Sodium (VOLTAREN) 1 % GEL APPLY 2 GRAMS TOPICALLY TO LEFT HIP FOUR TIMES DAILY 07/01/19   Kirsteins, Luanna Salk, MD  ELDERBERRY PO Take 1 tablet by mouth daily with breakfast.    [provider]  ezetimibe (ZETIA) 10 MG tablet Take 10 mg by mouth daily.     [provider]  fluticasone (FLONASE) 50 MCG/ACT nasal spray Place 1 spray into both nostrils daily.  05/28/19   Love, Ivan Anchors, PA-C  furosemide (LASIX) 40 MG tablet Take 40 mg by mouth daily. 06/23/19   [provider]  gabapentin (NEURONTIN) 300 MG capsule TAKE 2 CAPSULES BY MOUTH TWICE DAILY, ONCE AT 8:00 AM AND ONCE AT 4:00 PM, THEN 3 CAPSULES AT BEDTIME 07/07/19   Kirsteins, Luanna Salk, MD  HYDROcodone-acetaminophen (NORCO/VICODIN) 5-325 MG tablet hydrocodone 5 mg-acetaminophen 325 mg tablet  TAKE 1 TABLET BY MOUTH EVERY 6 HOURS AS NEEDED FOR SEVERE PAIN.    [provider]  lidocaine (LIDODERM) 5 % APPLY 2 PATCHES ONTO THE SKIN DAILY IN THE MORNING AND THEN REMOVE AFTER 12 HOURS 06/06/19   Kirsteins, Luanna Salk, MD  Multiple Vitamins-Minerals (HAIR/SKIN/NAILS/BIOTIN) TABS Take 2 tablets by mouth daily with breakfast.    [provider]  oxyCODONE (OXY IR/ROXICODONE) 5 MG immediate release tablet Take 1 tablet (5 mg total) by mouth every 6 (six) hours as needed for severe pain. 07/07/19   Kirsteins, Luanna Salk, MD  oxyCODONE (OXYCONTIN) 10 mg 12 hr tablet Take 1 tablet (10 mg total) by mouth every 12 (twelve) hours. 07/07/19   Kirsteins, Luanna Salk, MD  pantoprazole  (PROTONIX) 40 MG tablet Take 1 tablet (40 mg total) by mouth daily with breakfast. 07/26/19   Skeet Latch, MD  polyethylene glycol (MIRALAX / GLYCOLAX) 17 g packet Take 17 g by mouth daily as needed. 05/27/19   Love, Ivan Anchors, PA-C  propranolol (INDERAL) 10 MG tablet Take 1 tablet (10 mg total) by mouth 2 (two) times daily. 06/22/19   Wilber Oliphant, MD  senna (SENOKOT) 8.6 MG TABS tablet Take 1 tablet (8.6 mg total) by mouth daily. 05/23/19   Lattie Haw, MD  sertraline (ZOLOFT) 100 MG tablet Take 1 tablet (100 mg total) by mouth daily. 06/22/19   Wilber Oliphant, MD  tiZANidine (ZANAFLEX) 2 MG tablet Take 2 mg by mouth 3 (three) times daily.    [provider]  Vitamin D, Ergocalciferol, (DRISDOL) 1.25 MG (50000 UNIT) CAPS capsule Take 1 capsule (50,000 Units total) by mouth every 7 (seven) days. 05/27/19   Love, Ivan Anchors, PA-C  zonisamide (ZONEGRAN) 100 MG capsule TAKE 1 CAPSULE(100 MG) BY MOUTH TWICE DAILY 04/05/19   Lomax, Amy, NP  zonisamide (ZONEGRAN) 25 MG capsule take 25 mg twice daily prn in addition to 100 mg twice daily. 07/11/19   Lomax, Amy, NP    Allergies    Shellfish allergy and Statins  Review of Systems   Review of Systems  Constitutional: Positive for fatigue. Negative for chills and fever.  HENT: Negative for congestion.   Eyes: Negative for visual disturbance.  Respiratory: Positive for cough, shortness of breath and wheezing.   Cardiovascular: Positive for leg swelling. Negative for chest pain.  Gastrointestinal: Negative for abdominal pain and vomiting.  Genitourinary: Negative for dysuria and flank pain.  Musculoskeletal: Negative for back pain, neck pain and neck stiffness.  Skin: Negative for rash.  Neurological: Negative for light-headedness and headaches.    Physical Exam Updated Vital Signs BP (!) 157/77    Pulse 82    Temp 98.8 F (37.1 C) (Oral)    Resp 20    Ht 5\' 6"  (1.676 m)    Wt 87.5 kg    SpO2 98%    BMI 31.15 kg/m   Physical Exam Vitals  and nursing note reviewed.  Constitutional:      General: She is not in acute distress.    Appearance: She is well-developed. She  is not ill-appearing.  HENT:     Head: Normocephalic and atraumatic.  Eyes:     General:        Right eye: No discharge.        Left eye: No discharge.     Conjunctiva/sclera: Conjunctivae normal.  Neck:     Trachea: No tracheal deviation.  Cardiovascular:     Rate and Rhythm: Normal rate and regular rhythm.  Pulmonary:     Effort: Pulmonary effort is normal.     Breath sounds: Examination of the right-middle field reveals wheezing. Examination of the left-middle field reveals wheezing. Examination of the right-lower field reveals rales. Examination of the left-lower field reveals rales. Wheezing and rales present.  Abdominal:     General: There is no distension.     Palpations: Abdomen is soft.     Tenderness: There is no abdominal tenderness. There is no guarding.  Musculoskeletal:     Cervical back: Normal range of motion.     Right lower leg: Edema present.     Left lower leg: Edema present.  Skin:    General: Skin is warm.     Capillary Refill: Capillary refill takes less than 2 seconds.     Findings: No rash.     Comments: Patient has 2+ edema bilateral lower extremities extending to the knees, mild sensitivity to palpation, normal cap refill and warm feet distally bilateral.  Neurological:     General: No focal deficit present.     Mental Status: She is alert and oriented to person, place, and time.  Psychiatric:        Mood and Affect: Mood normal.     ED Results / Procedures / Treatments   Labs (all labs ordered are listed, but only abnormal results are displayed) Labs Reviewed  BASIC METABOLIC PANEL - Abnormal; Notable for the following components:      Result Value   Potassium 3.3 (*)    Glucose, Bld 121 (*)    Creatinine, Ser 1.17 (*)    GFR calc non Af Amer 48 (*)    GFR calc Af Amer 56 (*)    All other components within  normal limits  CBC - Abnormal; Notable for the following components:   Hemoglobin 11.4 (*)    RDW 16.2 (*)    All other components within normal limits  HEPATIC FUNCTION PANEL - Abnormal; Notable for the following components:   Alkaline Phosphatase 160 (*)    Total Bilirubin 0.2 (*)    Indirect Bilirubin 0.1 (*)    All other components within normal limits  BRAIN NATRIURETIC PEPTIDE  MAGNESIUM  TROPONIN I (HIGH SENSITIVITY)  TROPONIN I (HIGH SENSITIVITY)    EKG None  Radiology DG Chest 2 View  Result Date: 07/31/2019 CLINICAL DATA:  Shortness of breath.  Swelling. EXAM: CHEST - 2 VIEW COMPARISON:  January 05, 2019 FINDINGS: No pneumothorax. The heart, hila, mediastinum are normal. No overt edema. Mild atelectasis in the lateral left lung base. No other abnormalities. IMPRESSION: No active cardiopulmonary disease. Electronically Signed   By: Dorise Bullion III M.D   On: 07/31/2019 16:27   CT Angio Chest PE W and/or Wo Contrast  Result Date: 07/31/2019 CLINICAL DATA:  Short of breath for 2 weeks, dry cough EXAM: CT ANGIOGRAPHY CHEST WITH CONTRAST TECHNIQUE: Multidetector CT imaging of the chest was performed using the standard protocol during bolus administration of intravenous contrast. Multiplanar CT image reconstructions and MIPs were obtained to evaluate the vascular anatomy. CONTRAST:  56mL OMNIPAQUE IOHEXOL 350 MG/ML SOLN COMPARISON:  01/07/2016, 07/31/2019 FINDINGS: Cardiovascular: This is a technically adequate evaluation of the pulmonary vasculature. There are no filling defects or pulmonary emboli. The heart is unremarkable without pericardial effusion. Thoracic aorta is normal in caliber without aneurysm or dissection. There is moderate atherosclerosis of the coronary vasculature and thoracic aorta unchanged. Mediastinum/Nodes: No enlarged mediastinal, hilar, or axillary lymph nodes. Thyroid gland, trachea, and esophagus demonstrate no significant findings. Lungs/Pleura: Minimal  scarring at the left lung base. There is scattered areas of air trapping, with no superimposed airspace disease, effusion, or pneumothorax. Central airways are patent. Upper Abdomen: No acute upper abdominal findings. Musculoskeletal: No acute or destructive bony lesions. Reconstructed images demonstrate no additional findings. Review of the MIP images confirms the above findings. IMPRESSION: 1. No CT evidence of pulmonary embolism. 2. Scattered areas of air trapping consistent with small airways disease. No superimposed airspace disease. 3. Aortic Atherosclerosis (ICD10-I70.0). Electronically Signed   By: Randa Ngo M.D.   On: 07/31/2019 21:14   VAS [place] LOWER EXTREMITY VENOUS (DVT) (ONLY [facility] 7a-7p)  Result Date: 07/31/2019  Lower Venous DVTStudy Indications: Edema, and Ascites, wheezing.  Comparison Study: Prior study from 05/27/19 is available for comparison Performing Technologist: Sharion Dove RVS  Examination Guidelines: A complete evaluation includes B-mode imaging, spectral Doppler, color Doppler, and power Doppler as needed of all accessible portions of each vessel. Bilateral testing is considered an integral part of a complete examination. Limited examinations for reoccurring indications may be performed as noted. The reflux portion of the exam is performed with the patient in reverse Trendelenburg.  +---------+---------------+---------+-----------+----------+--------------+  RIGHT     Compressibility Phasicity Spontaneity Properties Thrombus Aging  +---------+---------------+---------+-----------+----------+--------------+  CFV       Full            Yes       Yes                                    +---------+---------------+---------+-----------+----------+--------------+  SFJ       Full                                                             +---------+---------------+---------+-----------+----------+--------------+  FV Prox   Full                                                              +---------+---------------+---------+-----------+----------+--------------+  FV Mid    Full                                                             +---------+---------------+---------+-----------+----------+--------------+  FV Distal Full                                                             +---------+---------------+---------+-----------+----------+--------------+  PFV       Full                                                             +---------+---------------+---------+-----------+----------+--------------+  POP       Full            Yes       Yes                                    +---------+---------------+---------+-----------+----------+--------------+  PTV       Full                                                             +---------+---------------+---------+-----------+----------+--------------+  PERO      Full                                                             +---------+---------------+---------+-----------+----------+--------------+   +---------+---------------+---------+-----------+----------+--------------+  LEFT      Compressibility Phasicity Spontaneity Properties Thrombus Aging  +---------+---------------+---------+-----------+----------+--------------+  CFV       Full            Yes       Yes                                    +---------+---------------+---------+-----------+----------+--------------+  SFJ       Full                                                             +---------+---------------+---------+-----------+----------+--------------+  FV Prox   Full                                                             +---------+---------------+---------+-----------+----------+--------------+  FV Mid    Full                                                             +---------+---------------+---------+-----------+----------+--------------+  FV Distal Full                                                              +---------+---------------+---------+-----------+----------+--------------+  PFV       Full                                                             +---------+---------------+---------+-----------+----------+--------------+  POP       Full            Yes       Yes                                    +---------+---------------+---------+-----------+----------+--------------+  PTV       Full                                                             +---------+---------------+---------+-----------+----------+--------------+  PERO      Full                                                             +---------+---------------+---------+-----------+----------+--------------+     Summary: BILATERAL: - No evidence of deep vein thrombosis seen in the lower extremities, bilaterally. - RIGHT: - Findings appear essentially unchanged compared to previous examination. - Ultrasound characteristics of enlarged lymph nodes are noted in the groin. Interstitial fluid noted in calf  LEFT: - Findings appear essentially unchanged compared to previous examination. - Ultrasound characteristics of enlarged lymph nodes noted in the groin. Interstitial fluid noted in calf.  *See table(s) above for measurements and observations.    Preliminary     Procedures Procedures (including critical care time)  Medications Ordered in ED Medications  sodium chloride flush (NS) 0.9 % injection 3 mL (3 mLs Intravenous Given 07/31/19 1808)  potassium chloride SA (KLOR-CON) CR tablet 40 mEq (40 mEq Oral Given 07/31/19 1809)  oxyCODONE-acetaminophen (PERCOCET/ROXICET) 5-325 MG per tablet 2 tablet (2 tablets Oral Given 07/31/19 2028)  iohexol (OMNIPAQUE) 350 MG/ML injection 75 mL (75 mLs Intravenous Contrast Given 07/31/19 2100)    ED Course  I have reviewed the triage vital signs and the nursing notes.  Pertinent labs & imaging results that were available during my care of the patient were reviewed by me and considered in my medical decision  making (see chart for details).    MDM Rules/Calculators/A&P                      Patient with multiple medical problems presents with worsening edema and weight gain and mild shortness of breath despite taking Lasix at home.  Concern clinically for worsening chronic edema from deconditioning/lymphedema versus congestive heart failure new onset.  Patient not requiring oxygen, normal work of breathing at rest.  Blood work reviewed mild low potassium 3.3, hemoglobin 11.4 chronic anemia.  Plan for oral potassium.  With history of DVT not on anticoagulation plan for ultrasound duplex to look for blood clots and CT of the chest. Ultrasound no DVT seen.  CT scan PE no acute blood clot.  Patient well-appearing on reassessment and stable for outpatient follow-up with cardiology and primary doctor.   Final Clinical Impression(s) / ED Diagnoses Final diagnoses:  Bilateral leg edema  Dyspnea, unspecified type  Hypokalemia    Rx / DC Orders ED Discharge Orders    None       Elnora Morrison, MD 07/31/19 2143

## 2019-07-31 NOTE — ED Notes (Signed)
Pt called out stating that she was getting dressed and leaving. RN notified Dr. Reather Converse. Pt States that she had a bad experience in CT and just wants to leave. Pt refusing d/c vitals and refusing to sign AMA. PT states she just wants to go.

## 2019-07-31 NOTE — Progress Notes (Signed)
VASCULAR LAB PRELIMINARY  PRELIMINARY  PRELIMINARY  PRELIMINARY  Bilateral lower extremity venous duplex completed.    Preliminary report:  See CV proc for preliminary results.  Gave report to Dr. Lesia Sago, [facility], RVT 07/31/2019, 6:44 PM

## 2019-07-31 NOTE — ED Triage Notes (Signed)
C/o swelling "all over" and SOB x 2 weeks.  Reports dry cough x 4 months that she has already been seen for multiple times.  Redness and pain to bilateral lower legs over the last week.

## 2019-07-31 NOTE — Discharge Instructions (Addendum)
Follow-up closely with your primary doctor and cardiology for further evaluation which may include echo of your heart. Return for new or worsening symptoms.

## 2019-08-02 ENCOUNTER — Ambulatory Visit (INDEPENDENT_AMBULATORY_CARE_PROVIDER_SITE_OTHER): Payer: Medicare Other | Admitting: Family Medicine

## 2019-08-02 ENCOUNTER — Ambulatory Visit: Payer: Medicare Other | Admitting: Family Medicine

## 2019-08-02 ENCOUNTER — Other Ambulatory Visit: Payer: Self-pay

## 2019-08-02 ENCOUNTER — Encounter: Payer: Self-pay | Admitting: Family Medicine

## 2019-08-02 VITALS — BP 90/62 | HR 74 | Ht 66.0 in | Wt 191.0 lb

## 2019-08-02 DIAGNOSIS — R2689 Other abnormalities of gait and mobility: Secondary | ICD-10-CM | POA: Diagnosis not present

## 2019-08-02 DIAGNOSIS — M79604 Pain in right leg: Secondary | ICD-10-CM | POA: Diagnosis not present

## 2019-08-02 DIAGNOSIS — E876 Hypokalemia: Secondary | ICD-10-CM | POA: Diagnosis not present

## 2019-08-02 DIAGNOSIS — M79605 Pain in left leg: Secondary | ICD-10-CM | POA: Diagnosis not present

## 2019-08-02 DIAGNOSIS — M7989 Other specified soft tissue disorders: Secondary | ICD-10-CM

## 2019-08-02 DIAGNOSIS — M545 Low back pain: Secondary | ICD-10-CM | POA: Diagnosis not present

## 2019-08-02 DIAGNOSIS — M4807 Spinal stenosis, lumbosacral region: Secondary | ICD-10-CM | POA: Diagnosis not present

## 2019-08-02 NOTE — Progress Notes (Signed)
    SUBJECTIVE:   CHIEF COMPLAINT / HPI:   Bilateral Leg Swelling Patient with acute on chronic bilateral leg swelling. She does have compression stocking but is not wearing them today. She does endorse wearing them out and about during most days and says they help some. She also continues to elevate her feet and legs at night and says as soon as she stands up it returns. I had her take some extra doses of lasix as her weight was up at the last appointment and her weight today is closer to her baseline. However, it appears the extra lasix doses did not improve her leg swelling. She has no pain, no erythema, no warmth.   PERTINENT  PMH / PSH: GERD, HTN, Hypotension, Hx of TIA, PVD  OBJECTIVE:   BP 90/62   Pulse 74   Ht 5\' 6"  (1.676 m)   Wt 191 lb (86.6 kg)   SpO2 96%   BMI 30.83 kg/m   Gen: NAD Cardiac: RRR, no murmurs Resp; CTAB, no wheezing Ext: +2-3+ pitting edema up to mid tibia bilaterally  ASSESSMENT/PLAN:   Leg swelling Acute on chronic. Stable. Considering we have tried compression stockings, elevating the feet, and increasing Lasix with not much improvement I will refer to lymphedema clinic. I suspect a component of vein and vascular dysfunction as well. I hope the vein and lymphedema clinic and can improve her leg swelling. I do not recommend increasing her lasix as her kidney function is already borderline and this may worsen it. Also, she has had episodes of hypotension in the past and may not tolerate an increased dose.     Nuala Alpha, [place]

## 2019-08-02 NOTE — Patient Instructions (Signed)
It was great to see you today! Thank you for letting me participate in your care!  Today, we discussed your continued leg swelling. I am referring you to the lymphedema clinic to get some help with controlling your swelling as we have ruled out other causes of your lower extremity swelling.  I am also rechecking your potassium today and I will call you with results and instruct you if you need to continue taking potassium pills or if you can stop.  Be well, Harolyn Rutherford, DO PGY-3, [facility]

## 2019-08-03 LAB — BASIC METABOLIC PANEL
BUN/Creatinine Ratio: 11 — ABNORMAL LOW (ref 12–28)
BUN: 14 mg/dL (ref 8–27)
CO2: 25 mmol/L (ref 20–29)
Calcium: 9 mg/dL (ref 8.7–10.3)
Chloride: 100 mmol/L (ref 96–106)
Creatinine, Ser: 1.24 mg/dL — ABNORMAL HIGH (ref 0.57–1.00)
GFR calc Af Amer: 52 mL/min/{1.73_m2} — ABNORMAL LOW (ref >59)
GFR calc non Af Amer: 45 mL/min/{1.73_m2} — ABNORMAL LOW (ref >59)
Glucose: 113 mg/dL — ABNORMAL HIGH (ref 65–99)
Potassium: 4 mmol/L (ref 3.5–5.2)
Sodium: 142 mmol/L (ref 134–144)

## 2019-08-04 DIAGNOSIS — R2689 Other abnormalities of gait and mobility: Secondary | ICD-10-CM | POA: Diagnosis not present

## 2019-08-04 DIAGNOSIS — M79605 Pain in left leg: Secondary | ICD-10-CM | POA: Diagnosis not present

## 2019-08-04 DIAGNOSIS — M545 Low back pain: Secondary | ICD-10-CM | POA: Diagnosis not present

## 2019-08-04 DIAGNOSIS — M79604 Pain in right leg: Secondary | ICD-10-CM | POA: Diagnosis not present

## 2019-08-04 NOTE — Assessment & Plan Note (Addendum)
Acute on chronic. Stable. Considering we have tried compression stockings, elevating the feet, and increasing Lasix with not much improvement I will refer to lymphedema clinic. I suspect a component of vein and vascular dysfunction as well. I hope the vein and lymphedema clinic and can improve her leg swelling. I do not recommend increasing her lasix as her kidney function is already borderline and this may worsen it. Also, she has had episodes of hypotension in the past and may not tolerate an increased dose.

## 2019-08-05 ENCOUNTER — Encounter: Payer: Medicare Other | Attending: Physical Medicine & Rehabilitation | Admitting: Physical Medicine & Rehabilitation

## 2019-08-05 ENCOUNTER — Encounter: Payer: Self-pay | Admitting: Physical Medicine & Rehabilitation

## 2019-08-05 ENCOUNTER — Other Ambulatory Visit: Payer: Self-pay

## 2019-08-05 VITALS — BP 132/75 | HR 84 | Temp 97.2°F | Ht 67.0 in | Wt 188.2 lb

## 2019-08-05 DIAGNOSIS — Z79891 Long term (current) use of opiate analgesic: Secondary | ICD-10-CM | POA: Diagnosis not present

## 2019-08-05 DIAGNOSIS — Z5181 Encounter for therapeutic drug level monitoring: Secondary | ICD-10-CM | POA: Diagnosis not present

## 2019-08-05 DIAGNOSIS — S32810S Multiple fractures of pelvis with stable disruption of pelvic ring, sequela: Secondary | ICD-10-CM

## 2019-08-05 DIAGNOSIS — G894 Chronic pain syndrome: Secondary | ICD-10-CM | POA: Diagnosis not present

## 2019-08-05 DIAGNOSIS — I2584 Coronary atherosclerosis due to calcified coronary lesion: Secondary | ICD-10-CM | POA: Diagnosis not present

## 2019-08-05 DIAGNOSIS — I2510 Atherosclerotic heart disease of native coronary artery without angina pectoris: Secondary | ICD-10-CM | POA: Diagnosis not present

## 2019-08-05 DIAGNOSIS — M48061 Spinal stenosis, lumbar region without neurogenic claudication: Secondary | ICD-10-CM | POA: Diagnosis not present

## 2019-08-05 MED ORDER — OXYCODONE HCL ER 10 MG PO T12A: 10.0000 mg | EXTENDED_RELEASE_TABLET | Freq: Every day | ORAL | 0 refills | Status: DC

## 2019-08-05 MED ORDER — OXYCODONE HCL 5 MG PO TABS: 5.0000 mg | ORAL_TABLET | Freq: Four times a day (QID) | ORAL | 0 refills | Status: DC | PRN

## 2019-08-05 NOTE — Progress Notes (Signed)
Subjective:    Patient ID: [name], female    DOB: 1950/03/18, 69 y.o.   MRN: [id number] 68 y.o. female with history of normocytic anemia, TIAs, HTN, cervical neuritis, migraines a fall 2 weeks 5 admission and pelvic fractures and poor pain control.  She was admitted on 03/06 at 22 with inability to ambulate and difficulty being managed at home.  CT of head was negative for acute changes.  CT pelvic and lumbar spine done for work-up and showed chronic compression fracture of L1 with less than 50% deformity and minimally displaced fracture of left pubis, left acetabular and left sacrum with extension to left SI joint.  Ortho was consulted for input and recommended TTWB RLE.  Gabapentin was added and titrated upwards for pain management.  IV Dilaudid was weaned off and multiple narcotics were added and titrated for better pain control.  Therapy was initiated and patient was noted to have functional deficits and mobility and ADLs.  CIR was recommended for follow-up therapy   Admit date: 05/23/2019 Discharge date: 05/29/2019 HPI Now allowed weightbearing with walker  Neck pain doing better , Dr Rolena Infante indicated that there is one "bad disc" in neck Per Dr Rolena Infante, hip/pelvic fractures healing well Back pain still an , 2 "bad discs" in lumbar.  Advised to try injections Rather than surgery  Started aquatic therapy , 2 x per week  Cutting back on Oxycontin CR 10mg , now 0mg  during the day but takes 10mg  at noc Now taking OxyIR 5mg  TID  The patient reports no adverse effects from pain medications Pain Inventory Average Pain 7 Pain Right Now 7 My pain is intermittent, sharp, burning, dull, stabbing, tingling and aching  In the last 24 hours, has pain interfered with the following? General activity 8 Relation with others 9 Enjoyment of life 10 What TIME of day is your pain at its worst? all Sleep (in general) Poor  Pain is worse with: bending and sitting Pain improves with: rest,  heat/ice, therapy/exercise, pacing activities and medication Relief from Meds: 7  Mobility use a cane use a walker ability to climb steps?  no do you drive?  no  Function retired I need assistance with the following:  shopping  Neuro/Psych weakness numbness tremor tingling trouble walking spasms dizziness confusion depression anxiety  Prior Studies Any changes since last visit?  no  Physicians involved in your care Any changes since last visit?  no   Family History  Problem Relation Age of Onset  . Cancer Mother 39       breast  . Alzheimer's disease Mother   . Prostate cancer Father   . Cancer Brother   . Hyperlipidemia Brother   . Lung cancer Father   . Dementia Maternal Grandmother   . Cancer Maternal Grandfather   . Cancer Brother   . Hyperlipidemia Brother   . Dementia Brother        frontotemporal dementia   Social History   Socioeconomic History  . Marital status: Married    Spouse name: [name]  . Number of children: 1  . Years of education: 12+  . Highest education level: High school graduate  Occupational History  . Occupation: ACTIVITY DIRECTOR    Employer: [employer]  . Occupation: CNA  Tobacco Use  . Smoking status: Former Smoker    Packs/day: 0.30    Types: Cigarettes    Quit date: 11/01/2011    Years since quitting: 7.7  . Smokeless tobacco: Never Used  Substance and Sexual Activity  . Alcohol use: No    Alcohol/week: 0.0 standard drinks  . Drug use: No  . Sexual activity: Yes    Partners: Male    Birth control/protection: Surgical    Comment: 1 sexual partner in last 12 months: married  Other Topics Concern  . Not on file  Social History Narrative   ** Merged History Encounter **       Patient is married ([name]). Patient has one child and grandchild. Her sons name is Shanon Brow. Patient sees them often. 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 Patient spends 6-10 hours with  her 6 days a week.     Patient has a college education. Patient is very active in her church.  Hobbies- outside crafts and church choir.   Social Determinants of Health   Financial Resource Strain: Low Risk   . Difficulty of Paying Living Expenses: Not hard at all  Food Insecurity: No Food Insecurity  . Worried About Charity fundraiser in the Last Year: Never true  . Ran Out of Food in the Last Year: Never true  Transportation Needs: No Transportation Needs  . Lack of Transportation (Medical): No  . Lack of Transportation (Non-Medical): No  Physical Activity: Insufficiently Active  . Days of Exercise per Week: 7 days  . Minutes of Exercise per Session: 20 min  Stress: No Stress Concern Present  . Feeling of Stress : Only a little  Social Connections: Slightly Isolated  . Frequency of Communication with Friends and Family: Twice a week  . Frequency of Social Gatherings with Friends and Family: More than three times a week  . Attends Religious Services: 1 to 4 times per year  . Active Member of Clubs or Organizations: No  . Attends Archivist Meetings: Never  . Marital Status: Married   Past Surgical History:  Procedure Laterality Date  . ABDOMINAL HYSTERECTOMY    . BILIARY STENT PLACEMENT N/A 12/12/2015   Procedure: BILIARY STENT PLACEMENT;  Surgeon: Doran Stabler, MD;  Location: [facility];  Service: Endoscopy;  Laterality: N/A;  . BLADDER REPAIR    . CARPAL TUNNEL RELEASE    . CERVICAL FUSION    . CERVICAL SPINE SURGERY    . CHOLECYSTECTOMY N/A 12/06/2015   Procedure: LAPAROSCOPIC CHOLECYSTECTOMY WITH  INTRAOPERATIVE CHOLANGIOGRAM;  Surgeon: Stark Klein, MD;  Location: [facility];  Service: General;  Laterality: N/A;  . ELBOW SURGERY    . ERCP N/A 12/12/2015   Procedure: ENDOSCOPIC RETROGRADE CHOLANGIOPANCREATOGRAPHY (ERCP);  Surgeon: Doran Stabler, MD;  Location: [facility];  Service: Endoscopy;  Laterality: N/A;  . ESOPHAGOGASTRODUODENOSCOPY N/A  01/28/2016   Procedure: ESOPHAGOGASTRODUODENOSCOPY (EGD) Biliary STENT removal;  Surgeon: Doran Stabler, MD;  Location: [facility];  Service: Gastroenterology;  Laterality: N/A;  . IR GENERIC HISTORICAL  12/17/2015   IR US GUIDE BX ASP/DRAIN 12/17/2015 [facility]  . IR GENERIC HISTORICAL  12/17/2015   IR GUIDED DRAIN W CATHETER PLACEMENT 12/17/2015 [facility]  . IR GENERIC HISTORICAL  12/17/2015   IR SINUS/FIST TUBE CHK-NON GI 12/17/2015 [facility]  . KNEE SURGERY    . LUNG SURGERY    . TEE WITHOUT CARDIOVERSION N/A 12/19/2015   Procedure: TRANSESOPHAGEAL ECHOCARDIOGRAM (TEE);  Surgeon: Josue Hector, MD;  Location: [facility] END[facility]COPY;  Service: Cardiovascular;  Laterality: N/A;   Past Medical History:  Diagnosis Date  . Anemia   . Cervical neuritis   .  Chronic kidney disease    stage 3  . Dyslipidemia   . GERD (gastroesophageal reflux disease)   . History of transient ischemic attack (TIA)   . HTN (hypertension)   . Hyperlipidemia   . Migraine   . Migraine headache   . Occipital neuralgia    Left  . Pulmonary embolism ([place]) 2017  . Renal insufficiency   . Stroke ([place]) 10/2013   BP 132/75   Pulse 84   Temp (!) 97.2 F (36.2 C)   Ht 5\' 7"  (1.702 m)   Wt 188 lb 3.2 oz (85.4 kg)   SpO2 90%   BMI 29.48 kg/m   Opioid Risk Score:   Fall Risk Score:  `1  Depression screen PHQ 2/9  Depression screen [facility] 2/9 08/05/2019 08/02/2019 07/20/2019 06/28/2019 06/28/2019 06/02/2019 01/06/2019  Decreased Interest 1 0 3 3 3  0 0  Down, Depressed, Hopeless 1 0 3 3 3  0 0  PHQ - 2 Score 2 0 6 6 6  0 0  Altered sleeping - - - 3 - - -  Tired, decreased energy - - - 3 - - -  Change in appetite - - - 3 - - -  Feeling bad or failure about yourself  - - - 2 - - -  Trouble concentrating - - - 2 - - -  Moving slowly or fidgety/restless - - - 1 - - -  Suicidal thoughts - - - 0 - - -  PHQ-9 Score - - - 20 - - -  Difficult doing work/chores - - - - - - -  Some recent data might be hidden     Review of Systems  Constitutional: Positive for diaphoresis.  HENT: Negative.   Eyes: Negative.   Respiratory: Positive for shortness of breath and wheezing.   Cardiovascular: Positive for leg swelling.  Gastrointestinal: Negative.   Endocrine: Negative.   Genitourinary: Negative.   Musculoskeletal: Positive for gait problem.       Spasms  Skin: Negative.   Allergic/Immunologic: Negative.   Neurological: Positive for dizziness, tremors, weakness and numbness.       Tinglings  Hematological: Negative.   Psychiatric/Behavioral: Positive for confusion and dysphoric mood. The patient is nervous/anxious.   All other systems reviewed and are negative.      Objective:   Physical Exam Vitals and nursing note reviewed.  Constitutional:      Appearance: Normal appearance. She is obese.  Eyes:     Extraocular Movements: Extraocular movements intact.     Conjunctiva/sclera: Conjunctivae normal.     Pupils: Pupils are equal, round, and reactive to light.  Musculoskeletal:     Comments: Tenderness to palpation right greater trochanter also right PSIS area There is also pain in the lumbar area when she goes from a flexed position to extended position in her lumbar spine but no tenderness to palpation. Negative straight leg raising  Neurological:     Mental Status: She is alert and oriented to person, place, and time.     Comments: Equal sensation in lower extremities to light touch Ambulates with a rolling walker no evidence of toe drag or knee instability  Psychiatric:        Mood and Affect: Mood normal.        Behavior: Behavior normal.        Thought Content: Thought content normal.        Judgment: Judgment normal.           Assessment & Plan:  #  1. Left pubic acetabular and sacroiliac fracture healing well. We will continue weaning her narcotic analgesic medications Reduce OxyContin CR 10 mg nightly Reduce oxycodone IR to 5 mg 3 times daily PDMP reviewed Follow-up  in 1 month #2. Lumbar spinal stenosis aggravated by fall, this appears to be the major pain generator but overall improving. She will follow-up with Dr. Nelva Bush for lumbar spine injections

## 2019-08-05 NOTE — Patient Instructions (Signed)
Try to do aquatic exercise , home program

## 2019-08-09 DIAGNOSIS — M545 Low back pain: Secondary | ICD-10-CM | POA: Diagnosis not present

## 2019-08-09 DIAGNOSIS — R2689 Other abnormalities of gait and mobility: Secondary | ICD-10-CM | POA: Diagnosis not present

## 2019-08-09 DIAGNOSIS — M79604 Pain in right leg: Secondary | ICD-10-CM | POA: Diagnosis not present

## 2019-08-09 DIAGNOSIS — M79605 Pain in left leg: Secondary | ICD-10-CM | POA: Diagnosis not present

## 2019-08-14 ENCOUNTER — Other Ambulatory Visit: Payer: Self-pay | Admitting: Physical Medicine & Rehabilitation

## 2019-08-16 DIAGNOSIS — M79604 Pain in right leg: Secondary | ICD-10-CM | POA: Diagnosis not present

## 2019-08-16 DIAGNOSIS — M545 Low back pain: Secondary | ICD-10-CM | POA: Diagnosis not present

## 2019-08-16 DIAGNOSIS — M79605 Pain in left leg: Secondary | ICD-10-CM | POA: Diagnosis not present

## 2019-08-16 DIAGNOSIS — R2689 Other abnormalities of gait and mobility: Secondary | ICD-10-CM | POA: Diagnosis not present

## 2019-08-19 DIAGNOSIS — Z23 Encounter for immunization: Secondary | ICD-10-CM | POA: Diagnosis not present

## 2019-08-22 ENCOUNTER — Other Ambulatory Visit: Payer: Self-pay

## 2019-08-22 ENCOUNTER — Ambulatory Visit (INDEPENDENT_AMBULATORY_CARE_PROVIDER_SITE_OTHER): Payer: Medicare Other | Admitting: Family Medicine

## 2019-08-22 VITALS — BP 122/80 | HR 85 | Ht 66.0 in | Wt 184.2 lb

## 2019-08-22 DIAGNOSIS — M79605 Pain in left leg: Secondary | ICD-10-CM | POA: Diagnosis not present

## 2019-08-22 DIAGNOSIS — M545 Low back pain: Secondary | ICD-10-CM | POA: Diagnosis not present

## 2019-08-22 DIAGNOSIS — I2510 Atherosclerotic heart disease of native coronary artery without angina pectoris: Secondary | ICD-10-CM | POA: Diagnosis not present

## 2019-08-22 DIAGNOSIS — I2584 Coronary atherosclerosis due to calcified coronary lesion: Secondary | ICD-10-CM | POA: Diagnosis not present

## 2019-08-22 DIAGNOSIS — R2689 Other abnormalities of gait and mobility: Secondary | ICD-10-CM | POA: Diagnosis not present

## 2019-08-22 DIAGNOSIS — M7989 Other specified soft tissue disorders: Secondary | ICD-10-CM

## 2019-08-22 DIAGNOSIS — M79604 Pain in right leg: Secondary | ICD-10-CM | POA: Diagnosis not present

## 2019-08-22 NOTE — Patient Instructions (Signed)
It was great to see you today! Thank you for letting me participate in your care!  Today, we discussed your leg swelling and I am glad it is improved. I am also encouraged that elevating your feet during the day worked so well. Please avoid foods high in salt such as frozen foods, canned foods, snacks like chips and crackers. Try to limit salt intake to 1,500mg  per day.  During the day please wear the compression stockings and elevate your feet while sitting. Please elevate your feet with pillows at night.  Continue taking 40mg  Lasix two times per day.  Be well, Harolyn Rutherford, DO PGY-3, [facility]

## 2019-08-22 NOTE — Assessment & Plan Note (Signed)
Improved, chronic. Her legs responded very well to the increase in Lasix and to keeping them elevated during the day. Unfortunately, it is not feasible nor advisable to continue to stay home all day every time her legs get swollen. I suspect with wearing compression stockings during the day and elevating them at night we can keep the swelling controlled. - Lymphedema clinic appointment is scheduled but is 4 months away due to a back log. - Cont Lasix 40mg  BID - Recheck BMP in 2 months - Attempt to keep dry weight to 185-189 lbs - Low salt diet adherence needed and discussed - Compression stockings during the day to allow her to due ADLs and build up more stamina and continue elevating feet when at rest at home during the day and at night

## 2019-08-22 NOTE — Progress Notes (Signed)
    SUBJECTIVE:   CHIEF COMPLAINT / HPI:   Chronic Leg Swelling Patient presents today for follow up for chronic lower leg swelling bilaterally. It has improved dramatically since our last appointment. She states they continue to swell up however the day before she stayed at home and elevated them all day and it has led to great improvement. She is able to get around with her walker or cane but will get dyspnea without using them. She is not short of breath at rest. Her weight is also down to her dry weight of 184-185 lbs. She has been taking Lasix 40mg  in the am and another one at lunch time. She is not wearing her compression stockings during the day but said she will start. We also discussed the importance of a low salt diet.  PERTINENT  PMH / PSH: CKD, HTN, GERD  OBJECTIVE:   BP 122/80   Pulse 85   Ht 5\' 6"  (1.676 m)   Wt 184 lb 3.2 oz (83.6 kg)   SpO2 95%   BMI 29.73 kg/m   Gen: NAD Cardiac: RRR, no murmurs Resp: CTAB, no crackles Ext: +1 pitting edema  ASSESSMENT/PLAN:   Leg swelling Improved, chronic. Her legs responded very well to the increase in Lasix and to keeping them elevated during the day. Unfortunately, it is not feasible nor advisable to continue to stay home all day every time her legs get swollen. I suspect with wearing compression stockings during the day and elevating them at night we can keep the swelling controlled. - Lymphedema clinic appointment is scheduled but is 4 months away due to a back log. - Cont Lasix 40mg  BID - Recheck BMP in 2 months - Attempt to keep dry weight to 185-189 lbs - Low salt diet adherence needed and discussed - Compression stockings during the day to allow her to due ADLs and build up more stamina and continue elevating feet when at rest at home during the day and at night F/u in 2 months    Nuala Alpha, [place]

## 2019-08-24 ENCOUNTER — Telehealth: Payer: Self-pay | Admitting: *Deleted

## 2019-08-24 ENCOUNTER — Other Ambulatory Visit: Payer: Self-pay | Admitting: Family Medicine

## 2019-08-24 DIAGNOSIS — M79605 Pain in left leg: Secondary | ICD-10-CM | POA: Diagnosis not present

## 2019-08-24 DIAGNOSIS — M545 Low back pain: Secondary | ICD-10-CM | POA: Diagnosis not present

## 2019-08-24 DIAGNOSIS — R748 Abnormal levels of other serum enzymes: Secondary | ICD-10-CM

## 2019-08-24 DIAGNOSIS — R2689 Other abnormalities of gait and mobility: Secondary | ICD-10-CM | POA: Diagnosis not present

## 2019-08-24 DIAGNOSIS — M79604 Pain in right leg: Secondary | ICD-10-CM | POA: Diagnosis not present

## 2019-08-24 NOTE — Progress Notes (Signed)
Patient had elevated Alk Phos from a hospital stay and needs repeat. If still elevated will recommend Hepatic panel, GGT, Hepatitis panel, and possibly RUQ U/S.

## 2019-08-24 NOTE — Telephone Encounter (Signed)
The [facility] called and report that they will not take pt on because she does not have cancer or a history of cancer. Christen Bame, CMA

## 2019-08-25 ENCOUNTER — Other Ambulatory Visit: Payer: Self-pay

## 2019-08-25 ENCOUNTER — Other Ambulatory Visit: Payer: Medicare Other

## 2019-08-25 DIAGNOSIS — R748 Abnormal levels of other serum enzymes: Secondary | ICD-10-CM

## 2019-08-26 LAB — COMPREHENSIVE METABOLIC PANEL
ALT: 15 IU/L (ref 0–32)
AST: 23 IU/L (ref 0–40)
Albumin/Globulin Ratio: 2.1 (ref 1.2–2.2)
Albumin: 4.4 g/dL (ref 3.8–4.8)
Alkaline Phosphatase: 188 IU/L — ABNORMAL HIGH (ref 48–121)
BUN/Creatinine Ratio: 16 (ref 12–28)
BUN: 16 mg/dL (ref 8–27)
Bilirubin Total: <0.2 mg/dL (ref 0.0–1.2)
CO2: 25 mmol/L (ref 20–29)
Calcium: 9.3 mg/dL (ref 8.7–10.3)
Chloride: 100 mmol/L (ref 96–106)
Creatinine, Ser: 1.03 mg/dL — ABNORMAL HIGH (ref 0.57–1.00)
GFR calc Af Amer: 65 mL/min/{1.73_m2} (ref >59)
GFR calc non Af Amer: 56 mL/min/{1.73_m2} — ABNORMAL LOW (ref >59)
Globulin, Total: 2.1 g/dL (ref 1.5–4.5)
Glucose: 143 mg/dL — ABNORMAL HIGH (ref 65–99)
Potassium: 3.8 mmol/L (ref 3.5–5.2)
Sodium: 144 mmol/L (ref 134–144)
Total Protein: 6.5 g/dL (ref 6.0–8.5)

## 2019-08-29 ENCOUNTER — Emergency Department (HOSPITAL_COMMUNITY): Payer: Medicare Other

## 2019-08-29 ENCOUNTER — Other Ambulatory Visit: Payer: Self-pay

## 2019-08-29 ENCOUNTER — Encounter (HOSPITAL_COMMUNITY): Payer: Self-pay | Admitting: *Deleted

## 2019-08-29 ENCOUNTER — Emergency Department (HOSPITAL_COMMUNITY)
Admission: EM | Admit: 2019-08-29 | Discharge: 2019-08-29 | Disposition: A | Discharge status: Home / Self Care | Payer: Medicare Other | Attending: Emergency Medicine | Admitting: Emergency Medicine

## 2019-08-29 DIAGNOSIS — I10 Essential (primary) hypertension: Secondary | ICD-10-CM | POA: Diagnosis not present

## 2019-08-29 DIAGNOSIS — R1110 Vomiting, unspecified: Secondary | ICD-10-CM | POA: Diagnosis not present

## 2019-08-29 DIAGNOSIS — R10813 Right lower quadrant abdominal tenderness: Secondary | ICD-10-CM | POA: Insufficient documentation

## 2019-08-29 DIAGNOSIS — Z86711 Personal history of pulmonary embolism: Secondary | ICD-10-CM | POA: Diagnosis not present

## 2019-08-29 DIAGNOSIS — R112 Nausea with vomiting, unspecified: Secondary | ICD-10-CM | POA: Diagnosis not present

## 2019-08-29 DIAGNOSIS — Z7901 Long term (current) use of anticoagulants: Secondary | ICD-10-CM | POA: Insufficient documentation

## 2019-08-29 DIAGNOSIS — R1031 Right lower quadrant pain: Secondary | ICD-10-CM | POA: Diagnosis not present

## 2019-08-29 DIAGNOSIS — R1111 Vomiting without nausea: Secondary | ICD-10-CM | POA: Diagnosis not present

## 2019-08-29 DIAGNOSIS — Z79899 Other long term (current) drug therapy: Secondary | ICD-10-CM | POA: Diagnosis not present

## 2019-08-29 DIAGNOSIS — R0902 Hypoxemia: Secondary | ICD-10-CM | POA: Diagnosis not present

## 2019-08-29 DIAGNOSIS — R197 Diarrhea, unspecified: Secondary | ICD-10-CM | POA: Diagnosis present

## 2019-08-29 DIAGNOSIS — R110 Nausea: Secondary | ICD-10-CM | POA: Diagnosis not present

## 2019-08-29 DIAGNOSIS — N183 Chronic kidney disease, stage 3 unspecified: Secondary | ICD-10-CM | POA: Insufficient documentation

## 2019-08-29 DIAGNOSIS — K529 Noninfective gastroenteritis and colitis, unspecified: Secondary | ICD-10-CM | POA: Insufficient documentation

## 2019-08-29 DIAGNOSIS — I129 Hypertensive chronic kidney disease with stage 1 through stage 4 chronic kidney disease, or unspecified chronic kidney disease: Secondary | ICD-10-CM | POA: Diagnosis not present

## 2019-08-29 DIAGNOSIS — R52 Pain, unspecified: Secondary | ICD-10-CM | POA: Diagnosis not present

## 2019-08-29 LAB — COMPREHENSIVE METABOLIC PANEL
ALT: 18 U/L (ref 0–44)
AST: 24 U/L (ref 15–41)
Albumin: 4.1 g/dL (ref 3.5–5.0)
Alkaline Phosphatase: 159 U/L — ABNORMAL HIGH (ref 38–126)
Anion gap: 10 (ref 5–15)
BUN: 8 mg/dL (ref 8–23)
CO2: 21 mmol/L — ABNORMAL LOW (ref 22–32)
Calcium: 9.4 mg/dL (ref 8.9–10.3)
Chloride: 108 mmol/L (ref 98–111)
Creatinine, Ser: 0.89 mg/dL (ref 0.44–1.00)
GFR calc Af Amer: >60 mL/min (ref >60)
GFR calc non Af Amer: >60 mL/min (ref >60)
Glucose, Bld: 135 mg/dL — ABNORMAL HIGH (ref 70–99)
Potassium: 3.2 mmol/L — ABNORMAL LOW (ref 3.5–5.1)
Sodium: 139 mmol/L (ref 135–145)
Total Bilirubin: 0.8 mg/dL (ref 0.3–1.2)
Total Protein: 7 g/dL (ref 6.5–8.1)

## 2019-08-29 LAB — CBC WITH DIFFERENTIAL/PLATELET
Abs Immature Granulocytes: 0.05 10*3/uL (ref 0.00–0.07)
Basophils Absolute: 0 10*3/uL (ref 0.0–0.1)
Basophils Relative: 1 %
Eosinophils Absolute: 0.1 10*3/uL (ref 0.0–0.5)
Eosinophils Relative: 1 %
HCT: 42.6 % (ref 36.0–46.0)
Hemoglobin: 13.4 g/dL (ref 12.0–15.0)
Immature Granulocytes: 1 %
Lymphocytes Relative: 10 %
Lymphs Abs: 0.7 10*3/uL (ref 0.7–4.0)
MCH: 25.9 pg — ABNORMAL LOW (ref 26.0–34.0)
MCHC: 31.5 g/dL (ref 30.0–36.0)
MCV: 82.2 fL (ref 80.0–100.0)
Monocytes Absolute: 0.3 10*3/uL (ref 0.1–1.0)
Monocytes Relative: 4 %
Neutro Abs: 5.7 10*3/uL (ref 1.7–7.7)
Neutrophils Relative %: 83 %
Platelets: 224 10*3/uL (ref 150–400)
RBC: 5.18 MIL/uL — ABNORMAL HIGH (ref 3.87–5.11)
RDW: 14.9 % (ref 11.5–15.5)
WBC: 6.8 10*3/uL (ref 4.0–10.5)
nRBC: 0 % (ref 0.0–0.2)

## 2019-08-29 LAB — URINALYSIS, ROUTINE W REFLEX MICROSCOPIC
Bacteria, UA: NONE SEEN
Bilirubin Urine: NEGATIVE
Glucose, UA: 50 mg/dL — AB
Hgb urine dipstick: NEGATIVE
Ketones, ur: 20 mg/dL — AB
Leukocytes,Ua: NEGATIVE
Nitrite: NEGATIVE
Protein, ur: 30 mg/dL — AB
Specific Gravity, Urine: 1.011 (ref 1.005–1.030)
pH: 8 (ref 5.0–8.0)

## 2019-08-29 LAB — LIPASE, BLOOD: Lipase: 27 U/L (ref 11–51)

## 2019-08-29 MED ORDER — SODIUM CHLORIDE 0.9 % IV BOLUS
1000.0000 mL | Freq: Once | INTRAVENOUS | Status: AC
  Administered 2019-08-29: 1000 mL via INTRAVENOUS

## 2019-08-29 MED ORDER — ONDANSETRON HCL 4 MG/2ML IJ SOLN
4.0000 mg | Freq: Once | INTRAMUSCULAR | Status: AC
  Administered 2019-08-29: 4 mg via INTRAVENOUS
  Filled 2019-08-29: qty 2

## 2019-08-29 MED ORDER — IOHEXOL 350 MG/ML SOLN
100.0000 mL | Freq: Once | INTRAVENOUS | Status: AC | PRN
  Administered 2019-08-29: 100 mL via INTRAVENOUS

## 2019-08-29 MED ORDER — PROCHLORPERAZINE MALEATE 10 MG PO TABS
10.0000 mg | ORAL_TABLET | Freq: Two times a day (BID) | ORAL | 0 refills | Status: DC | PRN
Start: 2019-08-29 — End: 2020-05-08

## 2019-08-29 MED ORDER — PROCHLORPERAZINE EDISYLATE 10 MG/2ML IJ SOLN
10.0000 mg | Freq: Once | INTRAMUSCULAR | Status: AC
  Administered 2019-08-29: 10 mg via INTRAVENOUS
  Filled 2019-08-29: qty 2

## 2019-08-29 NOTE — ED Notes (Signed)
Patient verbalizes understanding of discharge instructions . Opportunity for questions and answers were provided . Armband removed by staff ,Pt discharged from ED. W/C  offered at D/C  and Declined W/C at D/C and was escorted to lobby by RN.  

## 2019-08-29 NOTE — ED Provider Notes (Signed)
[facility] EMERGENCY DEPARTMENT Provider Note   CSN: [id number] Arrival date & time: 08/29/19  0865     History Chief Complaint  Patient presents with  . Emesis  . Abdominal Pain    [name] is a 69 y.o. female.  HPI Patient reports 3 days of persistent nausea, vomiting and diarrhea with intermittent RLQ pain worse with bowel movement. She has not had any blood in vomit or diarrhea but she did note some bilious emesis this morning which prompted her to come to the ED. She has not had a fever, no known sick contacts. No CP, SOB or dysuria. Patient has had cholecystectomy, but she is unsure about her appendix.     Past Medical History:  Diagnosis Date  . Anemia   . Cervical neuritis   . Chronic kidney disease    stage 3  . Dyslipidemia   . GERD (gastroesophageal reflux disease)   . History of transient ischemic attack (TIA)   . HTN (hypertension)   . Hyperlipidemia   . Migraine   . Migraine headache   . Occipital neuralgia    Left  . Pulmonary embolism ([place]) 2017  . Renal insufficiency   . Stroke [facility]) 10/2013    Patient Active Problem List   Diagnosis Date Noted  . [place] 06/05/2019  . Hypotension 06/03/2019  . Pain of left hip joint 05/31/2019  . Multiple pelvic fractures ([place]) 05/23/2019  . Muscular deconditioning   . Frequent falls   . Nondisplaced fracture of anterior wall of left acetabulum   . Fracture of multiple pubic rami, left 05/14/2019  . Chronic cough 01/05/2019  . Fatigue 11/04/2018  . Exposure to COVID-19 virus 10/05/2018  . Pre-operative clearance 04/21/2018  . PVD (peripheral vascular disease) ([place]) 04/20/2018  . CRI (chronic renal insufficiency), stage 3 (moderate) 04/20/2018  . Spinal stenosis of lumbar region 04/14/2018  . History of cerebrovascular accident 04/14/2018  . Headache disorder 04/14/2018  . Degeneration of lumbar intervertebral disc 04/14/2018  . Anxiety 04/14/2018  . Pyelonephritis 04/05/2018    . Complicated UTI (urinary tract infection) 04/04/2018  . Plantar fasciitis 01/10/2018  . Chronic anxiety 01/08/2018  . Gastroesophageal reflux disease 01/08/2018  . Menopausal syndrome 01/08/2018  . Leg swelling 08/24/2017  . Pulmonary embolism ([place])   . Coronary artery calcification   . Dyspnea 07/26/2017  . Osteoarthritis of knee 04/08/2017  . Status post cholecystectomy   . Hyperlipidemia LDL goal <70 04/02/2015  . Chronic migraine without aura, with status migrainosus 09/26/2014  . Migraine variant 10/10/2013  . TIA (transient ischemic attack) 10/10/2013  . Essential hypertension 07/18/2013  . 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 Neuralgia, neuritis, and radiculitis, unspecified 02/04/2012  . Myofascial muscle pain 07/15/2011    Past Surgical History:  Procedure Laterality Date  . ABDOMINAL HYSTERECTOMY    . BILIARY STENT PLACEMENT N/A 12/12/2015   Procedure: BILIARY STENT PLACEMENT;  Surgeon: Doran Stabler, MD;  Location: [facility];  Service: Endoscopy;  Laterality: N/A;  . BLADDER REPAIR    . CARPAL TUNNEL RELEASE    . CERVICAL FUSION    . CERVICAL SPINE SURGERY    . CHOLECYSTECTOMY N/A 12/06/2015   Procedure: LAPAROSCOPIC CHOLECYSTECTOMY WITH  INTRAOPERATIVE CHOLANGIOGRAM;  Surgeon: Stark Klein, MD;  Location: [facility];  Service: General;  Laterality: N/A;  . ELBOW SURGERY    . ERCP N/A 12/12/2015   Procedure: ENDOSCOPIC RETROGRADE CHOLANGIOPANCREATOGRAPHY (ERCP);  Surgeon: Doran Stabler, MD;  Location: [facility] ENDOSCOPY;  Service: Endoscopy;  Laterality: N/A;  . ESOPHAGOGASTRODUODENOSCOPY N/A 01/28/2016   Procedure: ESOPHAGOGASTRODUODENOSCOPY (EGD) Biliary STENT removal;  Surgeon: Doran Stabler, MD;  Location: [facility] ENDOSCOPY;  Service: Gastroenterology;  Laterality: N/A;  . IR GENERIC HISTORICAL  12/17/2015   IR US GUIDE BX ASP/DRAIN 12/17/2015 [facility]-INTERV RAD  . IR GENERIC HISTORICAL  12/17/2015   IR GUIDED DRAIN W CATHETER PLACEMENT 12/17/2015 [facility]   . IR GENERIC HISTORICAL  12/17/2015   IR SINUS/FIST TUBE CHK-NON GI 12/17/2015 [facility]INTER[facility]  . KNEE SURGERY    . LUNG SURGERY    . TEE WITHOUT CARDIOVERSION N/A 12/19/2015   Procedure: TRANSESOPHAGEAL ECHOCARDIOGRAM (TEE);  Surgeon: Josue Hector, MD;  Location: [facility] ENDOSCOPY;  Service: Cardiovascular;  Laterality: N/A;     OB History   No obstetric history on file.     Family History  Problem Relation Age of Onset  . Cancer Mother 15       breast  . Alzheimer's disease Mother   . Prostate cancer Father   . Cancer Brother   . Hyperlipidemia Brother   . Lung cancer Father   . Dementia Maternal Grandmother   . Cancer Maternal Grandfather   . Cancer Brother   . Hyperlipidemia Brother   . Dementia Brother        frontotemporal dementia    Social History   Tobacco Use  . Smoking status: Former Smoker    Packs/day: 0.30    Types: Cigarettes    Quit date: 11/01/2011    Years since quitting: 7.8  . Smokeless tobacco: Never Used  Vaping Use  . Vaping Use: Never used  Substance Use Topics  . Alcohol use: No    Alcohol/week: 0.0 standard drinks  . Drug use: No    Home Medications Prior to Admission medications   Medication Sig Start Date End Date Taking? Authorizing Provider  acetaminophen (TYLENOL) 325 MG tablet Take 2 tablets (650 mg total) by mouth 4 (four) times daily -  with meals and at bedtime. 05/27/19   Love, Ivan Anchors, PA-C  Alirocumab (PRALUENT) 75 MG/ML SOAJ Inject 75 mg into the skin every 14 (fourteen) days. 06/22/19   Skeet Latch, MD  amLODipine (NORVASC) 2.5 MG tablet Take 1 tablet (2.5 mg total) by mouth daily. CALL OFFICE FOR APPOINTMENT Patient not taking: Reported on 08/05/2019 06/01/19   Skeet Latch, MD  Baclofen 5 MG TABS Take 1 tablet by mouth 3 (three) times daily. 07/07/19   Kirsteins, Luanna Salk, MD  calcium carbonate (OS-CAL - DOSED IN MG OF ELEMENTAL CALCIUM) 1250 (500 Ca) MG tablet Take 1 tablet (500 mg of elemental calcium total) by mouth  daily with breakfast. 05/23/19   Lattie Haw, MD  cetirizine (ZYRTEC) 10 MG tablet Take 10 mg by mouth daily with breakfast.    [provider]  clopidogrel (PLAVIX) 75 MG tablet TAKE 1 TABLET(75 MG) BY MOUTH DAILY 07/11/19   Anderson, Chelsey L, DO  diclofenac Sodium (VOLTAREN) 1 % GEL APPLY 2 GRAMS TOPICALLY TO LEFT HIP FOUR TIMES DAILY 07/01/19   Kirsteins, Luanna Salk, MD  ELDERBERRY PO Take 1 tablet by mouth daily with breakfast.    [provider]  ezetimibe (ZETIA) 10 MG tablet Take 10 mg by mouth daily.     [provider]  fluticasone (FLONASE) 50 MCG/ACT nasal spray Place 1 spray into both nostrils daily. 05/28/19   Love, Ivan Anchors, PA-C  furosemide (LASIX) 40 MG tablet Take 40 mg by mouth daily. 06/23/19  [provider]  gabapentin (NEURONTIN) 300 MG capsule TAKE 2 CAPSULES BY MOUTH TWICE DAILY( 8 AM AND 4 PM) AND 3 CAPSULES AT BEDTIME 08/15/19   Kirsteins, Luanna Salk, MD  Multiple Vitamins-Minerals (HAIR/SKIN/NAILS/BIOTIN) TABS Take 2 tablets by mouth daily with breakfast.    [provider]  oxyCODONE (OXY IR/ROXICODONE) 5 MG immediate release tablet Take 1 tablet (5 mg total) by mouth every 6 (six) hours as needed for severe pain. 08/05/19   Kirsteins, Luanna Salk, MD  oxyCODONE (OXYCONTIN) 10 mg 12 hr tablet Take 1 tablet (10 mg total) by mouth at bedtime. 08/05/19   Kirsteins, Luanna Salk, MD  pantoprazole (PROTONIX) 40 MG tablet Take 1 tablet (40 mg total) by mouth daily with breakfast. 07/26/19   Skeet Latch, MD  polyethylene glycol (MIRALAX / GLYCOLAX) 17 g packet Take 17 g by mouth daily as needed. 05/27/19   Love, Ivan Anchors, PA-C  prochlorperazine (COMPAZINE) 10 MG tablet Take 1 tablet (10 mg total) by mouth 2 (two) times daily as needed for nausea or vomiting. 08/29/19   Truddie Hidden, MD  propranolol (INDERAL) 10 MG tablet Take 1 tablet (10 mg total) by mouth 2 (two) times daily. 06/22/19   Wilber Oliphant, MD  senna (SENOKOT) 8.6 MG TABS tablet  Take 1 tablet (8.6 mg total) by mouth daily. 05/23/19   Lattie Haw, MD  sertraline (ZOLOFT) 100 MG tablet Take 1 tablet (100 mg total) by mouth daily. 06/22/19   Wilber Oliphant, MD  tiZANidine (ZANAFLEX) 2 MG tablet Take 2 mg by mouth 3 (three) times daily.    [provider]  Vitamin D, Ergocalciferol, (DRISDOL) 1.25 MG (50000 UNIT) CAPS capsule Take 1 capsule (50,000 Units total) by mouth every 7 (seven) days. 05/27/19   Love, Ivan Anchors, PA-C  zonisamide (ZONEGRAN) 100 MG capsule TAKE 1 CAPSULE(100 MG) BY MOUTH TWICE DAILY 04/05/19   Lomax, Amy, NP  zonisamide (ZONEGRAN) 25 MG capsule take 25 mg twice daily prn in addition to 100 mg twice daily. 07/11/19   Lomax, Amy, NP    Allergies    Shellfish allergy and Statins  Review of Systems   Review of Systems A comprehensive review of systems was completed and negative except as noted in HPI.   Physical Exam Updated Vital Signs BP (!) 163/119   Pulse 98   Temp 98.2 F (36.8 C) (Oral)   Resp (!) 24   Ht 5\' 7"  (1.702 m)   Wt 79.4 kg   SpO2 98%   BMI 27.41 kg/m   Physical Exam Vitals and nursing note reviewed.  Constitutional:      Appearance: Normal appearance.  HENT:     Head: Normocephalic and atraumatic.     Nose: Nose normal.     Mouth/Throat:     Mouth: Mucous membranes are dry.  Eyes:     Extraocular Movements: Extraocular movements intact.     Conjunctiva/sclera: Conjunctivae normal.  Cardiovascular:     Rate and Rhythm: Normal rate.  Pulmonary:     Effort: Pulmonary effort is normal.     Breath sounds: Normal breath sounds.  Abdominal:     General: Abdomen is flat.     Palpations: Abdomen is soft.     Tenderness: There is abdominal tenderness in the right lower quadrant. There is no guarding. Negative signs include Murphy's sign and McBurney's sign.  Musculoskeletal:        General: No swelling. Normal range of motion.     Cervical  back: Neck supple.  Skin:    General: Skin is warm and dry.  Neurological:       General: No focal deficit present.     Mental Status: She is alert.  Psychiatric:        Mood and Affect: Mood normal.     ED Results / Procedures / Treatments   Labs (all labs ordered are listed, but only abnormal results are displayed) Labs Reviewed  CBC WITH DIFFERENTIAL/PLATELET - Abnormal; Notable for the following components:      Result Value   RBC 5.18 (*)    MCH 25.9 (*)    All other components within normal limits  COMPREHENSIVE METABOLIC PANEL - Abnormal; Notable for the following components:   Potassium 3.2 (*)    CO2 21 (*)    Glucose, Bld 135 (*)    Alkaline Phosphatase 159 (*)    All other components within normal limits  URINALYSIS, ROUTINE W REFLEX MICROSCOPIC - Abnormal; Notable for the following components:   APPearance CLOUDY (*)    Glucose, UA 50 (*)    Ketones, ur 20 (*)    Protein, ur 30 (*)    All other components within normal limits  LIPASE, BLOOD    EKG None  Radiology CT Abdomen Pelvis W Contrast  Result Date: 08/29/2019 CLINICAL DATA:  Right lower quadrant pain, nausea and vomiting, and diarrhea for 3 days. EXAM: CT ABDOMEN AND PELVIS WITH CONTRAST TECHNIQUE: Multidetector CT imaging of the abdomen and pelvis was performed using the standard protocol following bolus administration of intravenous contrast. CONTRAST:  140mL OMNIPAQUE IOHEXOL 350 MG/ML SOLN COMPARISON:  Noncontrast CT on 04/04/2018 FINDINGS: Lower Chest: No acute findings. Stable pleural-parenchymal scarring in lateral left lower lobe. Hepatobiliary: No hepatic masses identified. Prior cholecystectomy. No evidence of biliary obstruction. Pancreas:  No mass or inflammatory changes. Spleen: Within normal limits in size and appearance. Adrenals/Urinary Tract: No masses identified. No evidence of ureteral calculi or hydronephrosis. Stomach/Bowel: No evidence of obstruction, inflammatory process or abnormal fluid collections. Vascular/Lymphatic: No pathologically enlarged lymph nodes. No  abdominal aortic aneurysm. Aortic atherosclerosis noted. Reproductive: Prior hysterectomy noted. Adnexal regions are unremarkable in appearance. Other:  None. Musculoskeletal: Subacute bilateral sacral insufficiency fractures are new since previous study. Subacute healing fractures of the left pubis and the superior and inferior pubic rami are also seen which are new since prior exam. Small low-attenuation fluid collection is seen in the retropubic and right suprapubic region, which measures 4.1 x 1.3 cm, consistent with an aging extraperitoneal hematoma. Degenerative lumbar spondylosis again seen, without significant change. IMPRESSION: 1. No evidence of appendicitis or other acute findings. 2. Subacute bilateral sacral insufficiency fractures, and healing subacute fractures of left pubis and superior and inferior pubic rami, which are new since prior exam. 3. Small old extraperitoneal hematoma in retropubic and right suprapubic regions. Aortic Atherosclerosis (ICD10-I70.0). Electronically Signed   By: Marlaine Hind M.D.   On: 08/29/2019 13:14    Procedures Procedures (including critical care time)  Medications Ordered in ED Medications  sodium chloride 0.9 % bolus 1,000 mL (1,000 mLs Intravenous New Bag/Given 08/29/19 1033)  ondansetron (ZOFRAN) injection 4 mg (4 mg Intravenous Given 08/29/19 1014)  prochlorperazine (COMPAZINE) injection 10 mg (10 mg Intravenous Given 08/29/19 1118)  iohexol (OMNIPAQUE) 350 MG/ML injection 100 mL (100 mLs Intravenous Contrast Given 08/29/19 1233)    ED Course  I have reviewed the triage vital signs and the nursing notes.  Pertinent labs & imaging results that were available during  my care of the patient were reviewed by me and considered in my medical decision making (see chart for details).  Clinical Course as of Aug 28 1408  Mon Aug 29, 2019  1114 CBC unremarkable. Compazine ordered for continued nausea.    [CS]  1771 Chemistry shows mild hypokalemia, otherwise  unremarkable. UA with some ketones consistent with vomiting.    [CS]  1258 EKG: NRS, rate of 96, borderline QT duration. No ischemic changes. No old available to compare   [CS]  [place] and CT reviewed. Patient aware of previous pelvic fractures No concerning findings, symptoms likely viral GI infection. Patient feeling better, no additional vomiting after Compazine. Will give PO fluids and finish IV saline bolus and reassess for dispo.    [CS]  1657 Patient feeling much better, tolerating PO fluids and would like to go home. Rx for Compazine, advance diet as tolerated and followup with PCP. RTED for any other concerns.    [CS]    Clinical Course User Index [CS] Truddie Hidden, MD   MDM Rules/Calculators/A&P                           Patient with N/V/D, some mild RLQ pain/tenderness. Will check labs and CT, IVF and antiemetics.  Final Clinical Impression(s) / ED Diagnoses Final diagnoses:  Gastroenteritis    Rx / DC Orders ED Discharge Orders         Ordered    prochlorperazine (COMPAZINE) 10 MG tablet  2 times daily PRN     Discontinue  Reprint     08/29/19 1409           Truddie Hidden, MD 08/29/19 1410

## 2019-08-29 NOTE — ED Triage Notes (Signed)
Pt from home via EMS. With reported 3 day Hx of N/V/D Pt also reports RLQ pain. CBG 157, HR 09, . Pt given 4 mg Zofran via IN Rt hand

## 2019-09-01 DIAGNOSIS — S32592D Other specified fracture of left pubis, subsequent encounter for fracture with routine healing: Secondary | ICD-10-CM | POA: Diagnosis not present

## 2019-09-02 ENCOUNTER — Other Ambulatory Visit: Payer: Self-pay

## 2019-09-02 ENCOUNTER — Encounter: Payer: Medicare Other | Attending: Physical Medicine & Rehabilitation | Admitting: Physical Medicine & Rehabilitation

## 2019-09-02 ENCOUNTER — Encounter: Payer: Self-pay | Admitting: Physical Medicine & Rehabilitation

## 2019-09-02 VITALS — BP 129/85 | HR 76 | Temp 96.6°F | Ht 67.0 in | Wt 177.6 lb

## 2019-09-02 DIAGNOSIS — M48061 Spinal stenosis, lumbar region without neurogenic claudication: Secondary | ICD-10-CM

## 2019-09-02 DIAGNOSIS — S32810S Multiple fractures of pelvis with stable disruption of pelvic ring, sequela: Secondary | ICD-10-CM | POA: Diagnosis not present

## 2019-09-02 DIAGNOSIS — I2584 Coronary atherosclerosis due to calcified coronary lesion: Secondary | ICD-10-CM | POA: Diagnosis not present

## 2019-09-02 DIAGNOSIS — G894 Chronic pain syndrome: Secondary | ICD-10-CM | POA: Insufficient documentation

## 2019-09-02 DIAGNOSIS — Z79891 Long term (current) use of opiate analgesic: Secondary | ICD-10-CM | POA: Insufficient documentation

## 2019-09-02 DIAGNOSIS — I2510 Atherosclerotic heart disease of native coronary artery without angina pectoris: Secondary | ICD-10-CM | POA: Diagnosis not present

## 2019-09-02 DIAGNOSIS — Z5181 Encounter for therapeutic drug level monitoring: Secondary | ICD-10-CM | POA: Diagnosis not present

## 2019-09-02 NOTE — Progress Notes (Signed)
Subjective:    Patient ID: [name], female    DOB: 12-10-1950, 69 y.o.   MRN: [id number] 69 y.o.femalewith history of normocytic anemia, TIAs, HTN, cervical neuritis, migraines a fall 2 weeks 5 admission and pelvic fractures and poor pain control. She was admitted on 03/06 at 22 with inability to ambulate and difficulty being managed at home. CT of head was negative for acute changes. CT pelvic and lumbar spine done for work-up and showed chronic compression fracture of L1 with less than 50% deformity and minimally displaced fracture of left pubis, left acetabular and left sacrum with extension to left SI joint. Ortho was consulted for input and recommended TTWB RLE. Gabapentin was added and titrated upwards for pain management. IV Dilaudid was weaned off and multiple narcotics were added and titrated for better pain control. Therapy was initiated and patient was noted to have functional deficits and mobility and ADLs. CIR was recommended for follow-up therapy  Admit date:05/23/2019 Discharge date:05/29/2019 HPISees Dr Maureen Ralphs for hip and pelvic fx, Dr Rolena Infante will see for spine on 7/19  Takes Oxycontin ER 10mg  BID 10 a and 10p Take Oxy IR 5mg  2-3 x per day Supplements with tylenol  Trying to wean down, some days takes only 1 tablet, had some withdrawal symptoms when she had gastroenteritis and could not take her pills    Still uses walker in community but not at home  No falls Doing water therapy  Now is allow full weight bearing   Dr Maureen Ralphs allowed pt to drive with someone with her.   Pain Inventory Average Pain 8 Pain Right Now 8 My pain is .  In the last 24 hours, has pain interfered with the following? General activity 8 Relation with others 8 Enjoyment of life 8 What TIME of day is your pain at its worst? all Sleep (in general) Poor  Pain is worse with: walking, bending, sitting, inactivity, standing, unsure and some activites Pain improves with: rest,  heat/ice, therapy/exercise, pacing activities, medication and TENS Relief from Meds: 5  Mobility use a cane use a walker  Function disabled: date disabled . I need assistance with the following:  meal prep, household duties and shopping  Neuro/Psych depression anxiety  Prior Studies Any changes since last visit?  no  Physicians involved in your care Any changes since last visit?  no   Family History  Problem Relation Age of Onset  . Cancer Mother 31       breast  . Alzheimer's disease Mother   . Prostate cancer Father   . Cancer Brother   . Hyperlipidemia Brother   . Lung cancer Father   . Dementia Maternal Grandmother   . Cancer Maternal Grandfather   . Cancer Brother   . Hyperlipidemia Brother   . Dementia Brother        frontotemporal dementia   Social History   Socioeconomic History  . Marital status: Married    Spouse name: [name]  . Number of children: 1  . Years of education: 12+  . Highest education level: High school graduate  Occupational History  . Occupation: ACTIVITY DIRECTOR    Employer: [employer]  . Occupation: CNA  Tobacco Use  . Smoking status: Former Smoker    Packs/day: 0.30    Types: Cigarettes    Quit date: 11/01/2011    Years since quitting: 7.8  . Smokeless tobacco: Never Used  Vaping Use  . Vaping Use: Never used  Substance and Sexual Activity  .  Alcohol use: No    Alcohol/week: 0.0 standard drinks  . Drug use: No  . Sexual activity: Yes    Partners: Male    Birth control/protection: Surgical    Comment: 1 sexual partner in last 12 months: married  Other Topics Concern  . Not on file  Social History Narrative   ** Merged History Encounter **       Patient is married ([name]). Patient has one child and grandchild. Her sons name is Shanon Brow. Patient sees them often. 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 Patient spends 6-10 hours with her 6 days a week.     Patient has  a college education. Patient is very active in her church.  Hobbies- outside crafts and church choir.   Social Determinants of Health   Financial Resource Strain: Low Risk   . Difficulty of Paying Living Expenses: Not hard at all  Food Insecurity: No Food Insecurity  . Worried About Charity fundraiser in the Last Year: Never true  . Ran Out of Food in the Last Year: Never true  Transportation Needs: No Transportation Needs  . Lack of Transportation (Medical): No  . Lack of Transportation (Non-Medical): No  Physical Activity: Insufficiently Active  . Days of Exercise per Week: 7 days  . Minutes of Exercise per Session: 20 min  Stress: No Stress Concern Present  . Feeling of Stress : Only a little  Social Connections: Moderately Integrated  . Frequency of Communication with Friends and Family: Twice a week  . Frequency of Social Gatherings with Friends and Family: More than three times a week  . Attends Religious Services: 1 to 4 times per year  . Active Member of Clubs or Organizations: No  . Attends Archivist Meetings: Never  . Marital Status: Married   Past Surgical History:  Procedure Laterality Date  . ABDOMINAL HYSTERECTOMY    . BILIARY STENT PLACEMENT N/A 12/12/2015   Procedure: BILIARY STENT PLACEMENT;  Surgeon: Doran Stabler, MD;  Location: [place] [facility];  Service: Endoscopy;  Laterality: N/A;  . BLADDER REPAIR    . CARPAL TUNNEL RELEASE    . CERVICAL FUSION    . CERVICAL SPINE SURGERY    . CHOLECYSTECTOMY N/A 12/06/2015   Procedure: LAPAROSCOPIC CHOLECYSTECTOMY WITH  INTRAOPERATIVE CHOLANGIOGRAM;  Surgeon: Stark Klein, MD;  Location: [place];  Service: General;  Laterality: N/A;  . ELBOW SURGERY    . ERCP N/A 12/12/2015   Procedure: ENDOSCOPIC RETROGRADE CHOLANGIOPANCREATOGRAPHY (ERCP);  Surgeon: Doran Stabler, MD;  Location: [facility] ENDOSCOPY;  Service: Endoscopy;  Laterality: N/A;  . ESOPHAGOGASTRODUODENOSCOPY N/A 01/28/2016   Procedure:  ESOPHAGOGASTRODUODENOSCOPY (EGD) Biliary STENT removal;  Surgeon: Doran Stabler, MD;  Location: [facility];  Service: Gastroenterology;  Laterality: N/A;  . IR GENERIC HISTORICAL  12/17/2015   IR US GUIDE BX ASP/DRAIN 12/17/2015 [facility]  . IR GENERIC HISTORICAL  12/17/2015   IR GUIDED DRAIN W CATHETER PLACEMENT 12/17/2015 [facility]  . IR GENERIC HISTORICAL  12/17/2015   IR SINUS/FIST TUBE CHK-NON GI 12/17/2015 [facility]  . KNEE SURGERY    . LUNG SURGERY    . TEE WITHOUT CARDIOVERSION N/A 12/19/2015   Procedure: TRANSESOPHAGEAL ECHOCARDIOGRAM (TEE);  Surgeon: Josue Hector, MD;  Location: [facility] ENDOSCOPY;  Service: Cardiovascular;  Laterality: N/A;   Past Medical History:  Diagnosis Date  . Anemia   . Cervical neuritis   . Chronic kidney disease  stage 3  . Dyslipidemia   . GERD (gastroesophageal reflux disease)   . History of transient ischemic attack (TIA)   . HTN (hypertension)   . Hyperlipidemia   . Migraine   . Migraine headache   . Occipital neuralgia    Left  . Pulmonary embolism ([place]) 2017  . Renal insufficiency   . Stroke ([place]) 10/2013   BP 129/85   Pulse 76   Temp (!) 96.6 F (35.9 C)   Ht 5\' 7"  (1.702 m)   Wt 177 lb 9.6 oz (80.6 kg)   SpO2 92%   BMI 27.82 kg/m   Opioid Risk Score:   Fall Risk Score:  `1  Depression screen PHQ 2/9  Depression screen [facility] 2/9 09/02/2019 08/22/2019 08/05/2019 08/02/2019 07/20/2019 06/28/2019 06/28/2019  Decreased Interest 3 0 1 0 3 3 3   Down, Depressed, Hopeless 3 0 1 0 3 3 3   PHQ - 2 Score 6 0 2 0 6 6 6   Altered sleeping - - - - - 3 -  Tired, decreased energy - - - - - 3 -  Change in appetite - - - - - 3 -  Feeling bad or failure about yourself  - - - - - 2 -  Trouble concentrating - - - - - 2 -  Moving slowly or fidgety/restless - - - - - 1 -  Suicidal thoughts - - - - - 0 -  PHQ-9 Score - - - - - 20 -  Difficult doing work/chores - - - - - - -  Some recent data might be hidden   Review of Systems   Constitutional: Positive for unexpected weight change.  HENT: Negative.   Eyes: Negative.   Respiratory: Negative.   Cardiovascular: Positive for leg swelling.  Gastrointestinal: Negative.   Endocrine: Negative.   Genitourinary: Negative.   Musculoskeletal: Negative.   Skin: Negative.   Allergic/Immunologic: Negative.   Neurological: Negative.   Hematological: Negative.   Psychiatric/Behavioral: Positive for dysphoric mood. The patient is nervous/anxious.   All other systems reviewed and are negative.      Objective:   Physical Exam Vitals and nursing note reviewed.  Constitutional:      Appearance: Normal appearance.  Eyes:     Extraocular Movements: Extraocular movements intact.     Conjunctiva/sclera: Conjunctivae normal.     Pupils: Pupils are equal, round, and reactive to light.  Neurological:     Mental Status: She is alert.   Ambulates without assistive device no evidence of toe drag or knee instability, she has a wide-based support Patient has mild tenderness at both PSIS areas, no pain in the lumbar paraspinals.  She has limited lumbar range of motion with flexion extension lateral bending and rotation at 25% of normal Lower extremity strength is normal, negative straight leg raising  Mood and affect are appropriate Cognition intact and at baseline      Assessment & Plan:  #1.  History of displaced left pubic ramus fracture left acetabular fracture and left sacral fracture.  The patient is requesting a spine injection.  We discussed that while this may be an option in the future at this point she needs to have full fracture healing. She will need to get the okay from Dr. Rolena Infante on this. In the meantime we will continue with her pain medicine.  She is not taking as much as prescribed and has some medication left over.  Therefore she does not need another prescription today. She will reduce  her OxyContin to 10 mg at night only continue oxycodone 5 mg twice daily or  3 times daily. I will see her back in 1 month with palpable

## 2019-09-02 NOTE — Patient Instructions (Signed)
Would reduce/stop daytime dose of Oxycontin CR 10mg 

## 2019-09-03 ENCOUNTER — Other Ambulatory Visit: Payer: Self-pay | Admitting: Adult Health

## 2019-09-15 DIAGNOSIS — M79604 Pain in right leg: Secondary | ICD-10-CM | POA: Diagnosis not present

## 2019-09-15 DIAGNOSIS — M545 Low back pain: Secondary | ICD-10-CM | POA: Diagnosis not present

## 2019-09-15 DIAGNOSIS — R2689 Other abnormalities of gait and mobility: Secondary | ICD-10-CM | POA: Diagnosis not present

## 2019-09-15 DIAGNOSIS — M79605 Pain in left leg: Secondary | ICD-10-CM | POA: Diagnosis not present

## 2019-09-16 DIAGNOSIS — Z23 Encounter for immunization: Secondary | ICD-10-CM | POA: Diagnosis not present

## 2019-09-26 DIAGNOSIS — M419 Scoliosis, unspecified: Secondary | ICD-10-CM | POA: Diagnosis not present

## 2019-09-26 DIAGNOSIS — M48062 Spinal stenosis, lumbar region with neurogenic claudication: Secondary | ICD-10-CM | POA: Diagnosis not present

## 2019-09-28 DIAGNOSIS — R3129 Other microscopic hematuria: Secondary | ICD-10-CM | POA: Diagnosis not present

## 2019-09-28 DIAGNOSIS — N2581 Secondary hyperparathyroidism of renal origin: Secondary | ICD-10-CM | POA: Diagnosis not present

## 2019-09-28 DIAGNOSIS — I129 Hypertensive chronic kidney disease with stage 1 through stage 4 chronic kidney disease, or unspecified chronic kidney disease: Secondary | ICD-10-CM | POA: Diagnosis not present

## 2019-09-28 DIAGNOSIS — D631 Anemia in chronic kidney disease: Secondary | ICD-10-CM | POA: Diagnosis not present

## 2019-09-28 DIAGNOSIS — N183 Chronic kidney disease, stage 3 unspecified: Secondary | ICD-10-CM | POA: Diagnosis not present

## 2019-09-30 ENCOUNTER — Other Ambulatory Visit: Payer: Self-pay

## 2019-09-30 ENCOUNTER — Encounter: Payer: Medicare Other | Attending: Physical Medicine & Rehabilitation | Admitting: Physical Medicine & Rehabilitation

## 2019-09-30 ENCOUNTER — Encounter: Payer: Self-pay | Admitting: Physical Medicine & Rehabilitation

## 2019-09-30 VITALS — BP 103/68 | HR 71 | Temp 98.7°F | Ht 67.0 in | Wt 183.2 lb

## 2019-09-30 DIAGNOSIS — M48061 Spinal stenosis, lumbar region without neurogenic claudication: Secondary | ICD-10-CM

## 2019-09-30 DIAGNOSIS — I2584 Coronary atherosclerosis due to calcified coronary lesion: Secondary | ICD-10-CM

## 2019-09-30 DIAGNOSIS — I2510 Atherosclerotic heart disease of native coronary artery without angina pectoris: Secondary | ICD-10-CM

## 2019-09-30 DIAGNOSIS — S32810S Multiple fractures of pelvis with stable disruption of pelvic ring, sequela: Secondary | ICD-10-CM | POA: Insufficient documentation

## 2019-09-30 DIAGNOSIS — Z5181 Encounter for therapeutic drug level monitoring: Secondary | ICD-10-CM | POA: Diagnosis not present

## 2019-09-30 DIAGNOSIS — M259 Joint disorder, unspecified: Secondary | ICD-10-CM

## 2019-09-30 DIAGNOSIS — Z79891 Long term (current) use of opiate analgesic: Secondary | ICD-10-CM | POA: Insufficient documentation

## 2019-09-30 DIAGNOSIS — G894 Chronic pain syndrome: Secondary | ICD-10-CM | POA: Diagnosis not present

## 2019-09-30 MED ORDER — OXYCODONE HCL 5 MG PO TABS: 5.0000 mg | ORAL_TABLET | Freq: Four times a day (QID) | ORAL | 0 refills | Status: DC | PRN

## 2019-09-30 NOTE — Progress Notes (Signed)
Subjective:    Patient ID: [name], female    DOB: 1950/07/17, 69 y.o.   MRN: [id number]  HPI 6/21 ED visit for nausea, CT abd performed, inf/sup pubic ramus fx subacute . Saw Dr Rolena Infante this week recommended Right sacroiliac injection.   Has appt with Dr Maureen Ralphs  Doing LE band exercises Completed aquatic exercise PT Back to driving Cooking and cleaning but not vacuuming or mopping No falls  We discussed the patient's pain medication regimen.  Based on pill counts she is taking approximately half of what she has been prescribed.  This prescription has lasted for almost 2 months and still has some medications left. Patient feels like she no longer needs the OxyContin, she only has 2 tablets left of this Pain Inventory Average Pain 6 Pain Right Now 8 My pain is sharp, dull, stabbing and aching  In the last 24 hours, has pain interfered with the following? General activity 8 Relation with others 8 Enjoyment of life 8 What TIME of day is your pain at its worst? all Sleep (in general) Poor  Pain is worse with: walking, bending, sitting and standing Pain improves with: rest and medication Relief from Meds: 6  Mobility use a cane how many minutes can you walk? 2-5 ability to climb steps?  yes do you drive?  yes  Function disabled: date disabled .  Neuro/Psych trouble walking  Prior Studies Any changes since last visit?  no  Physicians involved in your care Orthopedist Dr. Rolena Infante   Family History  Problem Relation Age of Onset  . Cancer Mother 10       breast  . Alzheimer's disease Mother   . Prostate cancer Father   . Cancer Brother   . Hyperlipidemia Brother   . Lung cancer Father   . Dementia Maternal Grandmother   . Cancer Maternal Grandfather   . Cancer Brother   . Hyperlipidemia Brother   . Dementia Brother        frontotemporal dementia   Social History   Socioeconomic History  . Marital status: Married    Spouse name: [name]  . Number of  children: 1  . Years of education: 12+  . Highest education level: High school graduate  Occupational History  . Occupation: ACTIVITY DIRECTOR    Employer: [employer]  . Occupation: CNA  Tobacco Use  . Smoking status: Former Smoker    Packs/day: 0.30    Types: Cigarettes    Quit date: 11/01/2011    Years since quitting: 7.9  . Smokeless tobacco: Never Used  Vaping Use  . Vaping Use: Never used  Substance and Sexual Activity  . Alcohol use: No    Alcohol/week: 0.0 standard drinks  . Drug use: No  . Sexual activity: Yes    Partners: Male    Birth control/protection: Surgical    Comment: 1 sexual partner in last 12 months: married  Other Topics Concern  . Not on file  Social History Narrative   ** Merged History Encounter **       Patient is married ([name]). Patient has one child and grandchild. Her sons name is Shanon Brow. Patient sees them often. 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 Patient spends 6-10 hours with her 6 days a week.     Patient has a college education. Patient is very active in her church.  Hobbies- outside crafts and church choir.   Social Determinants of Health  Financial Resource Strain:   . Difficulty of Paying Living Expenses:   Food Insecurity:   . Worried About Charity fundraiser in the Last Year:   . Arboriculturist in the Last Year:   Transportation Needs:   . Film/video editor (Medical):   Marland Kitchen Lack of Transportation (Non-Medical):   Physical Activity:   . Days of Exercise per Week:   . Minutes of Exercise per Session:   Stress:   . Feeling of Stress :   Social Connections:   . Frequency of Communication with Friends and Family:   . Frequency of Social Gatherings with Friends and Family:   . Attends Religious Services:   . Active Member of Clubs or Organizations:   . Attends Archivist Meetings:   Marland Kitchen Marital Status:    Past Surgical History:  Procedure Laterality  Date  . ABDOMINAL HYSTERECTOMY    . BILIARY STENT PLACEMENT N/A 12/12/2015   Procedure: BILIARY STENT PLACEMENT;  Surgeon: Doran Stabler, MD;  Location: [place] [facility];  Service: Endoscopy;  Laterality: N/A;  . BLADDER REPAIR    . CARPAL TUNNEL RELEASE    . CERVICAL FUSION    . CERVICAL SPINE SURGERY    . CHOLECYSTECTOMY N/A 12/06/2015   Procedure: LAPAROSCOPIC CHOLECYSTECTOMY WITH  INTRAOPERATIVE CHOLANGIOGRAM;  Surgeon: Stark Klein, MD;  Location: [place];  Service: General;  Laterality: N/A;  . ELBOW SURGERY    . ERCP N/A 12/12/2015   Procedure: ENDOSCOPIC RETROGRADE CHOLANGIOPANCREATOGRAPHY (ERCP);  Surgeon: Doran Stabler, MD;  Location[facility] ENDOSCOPY;  Service: Endoscopy;  Laterality: N/A;  . ESOPHAGOGASTRODUODENOSCOPY N/A 01/28/2016   Procedure: ESOPHAGOGASTRODUODENOSCOPY (EGD) Biliary STENT removal;  Surgeon: Doran Stabler, MD;  Location[facility];  Service: Gastroenterology;  Laterality: N/A;  . IR GENERIC HISTORICAL  12/17/2015   IR US GUIDE BX ASP/DRAIN 12/17/2015 [facility]  . IR GENERIC HISTORICAL  12/17/2015   IR GUIDED DRAIN W CATHETER PLACEMENT 12/17/2015 [facility]  . IR GENERIC HISTORICAL  12/17/2015   IR SINUS/FIST TUBE CHK-NON GI 12/17/2015 [facility]  . KNEE SURGERY    . LUNG SURGERY    . TEE WITHOUT CARDIOVERSION N/A 12/19/2015   Procedure: TRANSESOPHAGEAL ECHOCARDIOGRAM (TEE);  Surgeon: Josue Hector, MD;  Location: [facility] ENDOSCOPY;  Service: Cardiovascular;  Laterality: N/A;   Past Medical History:  Diagnosis Date  . Anemia   . Cervical neuritis   . Chronic kidney disease    stage 3  . Dyslipidemia   . GERD (gastroesophageal reflux disease)   . History of transient ischemic attack (TIA)   . HTN (hypertension)   . Hyperlipidemia   . Migraine   . Migraine headache   . Occipital neuralgia    Left  . Pulmonary embolism ([place]) 2017  . Renal insufficiency   . Stroke ([place]) 10/2013   BP 103/68   Pulse 71   Temp 98.7 F (37.1 C)   Ht 5\' 7"   (1.702 m)   Wt 183 lb 3.2 oz (83.1 kg)   SpO2 92%   BMI 28.69 kg/m   Opioid Risk Score:   Fall Risk Score:  `1  Depression screen PHQ 2/9  Depression screen [facility] 2/9 09/02/2019 08/22/2019 08/05/2019 08/02/2019 07/20/2019 06/28/2019 06/28/2019  Decreased Interest 3 0 1 0 3 3 3   Down, Depressed, Hopeless 3 0 1 0 3 3 3   PHQ - 2 Score 6 0 2 0 6 6 6   Altered sleeping - - - - -  3 -  Tired, decreased energy - - - - - 3 -  Change in appetite - - - - - 3 -  Feeling bad or failure about yourself  - - - - - 2 -  Trouble concentrating - - - - - 2 -  Moving slowly or fidgety/restless - - - - - 1 -  Suicidal thoughts - - - - - 0 -  PHQ-9 Score - - - - - 20 -  Difficult doing work/chores - - - - - - -  Some recent data might be hidden   Review of Systems  Musculoskeletal: Positive for gait problem.  All other systems reviewed and are negative.      Objective:   Physical Exam Vitals and nursing note reviewed.  HENT:     Head: Normocephalic and atraumatic.     Mouth/Throat:     Mouth: Mucous membranes are moist.  Eyes:     Extraocular Movements: Extraocular movements intact.     Conjunctiva/sclera: Conjunctivae normal.     Pupils: Pupils are equal, round, and reactive to light.  Pulmonary:     Effort: Pulmonary effort is normal.  Musculoskeletal:        General: Tenderness present.     Comments: Tenderness along right PSIS area Positive lateral compression test Faber's is not painful  Skin:    General: Skin is warm and dry.  Neurological:     Mental Status: She is alert and oriented to person, place, and time.  Psychiatric:        Mood and Affect: Mood normal.        Behavior: Behavior normal.           Assessment & Plan:  #1.  Multiple right-sided pelvic fractures inferior superior pubic ramus, sacral ala and right acetabular fracture, most painful area appears to be right sacroiliac joint.  As recommended by orthopedic spine surgery, Dr. Rolena Infante, will do right sacroiliac  injection under fluoroscopic guidance. Patient still has substantial pain requiring narcotic analgesics however she has been able to reduce her dose.  Therefore we will discontinue OxyContin Continue oxycodone 5 mg 3 times daily as needed reevaluate usage pattern at next visit, goal would be to continue taking 5 mg twice daily and then potentially switch to tramadol

## 2019-09-30 NOTE — Patient Instructions (Addendum)
Sacroiliac (SI) Joint Injection Patient Information  Description: The sacroiliac joint connects the scrum (very low back and tailbone) to the ilium (a pelvic bone which also forms half of the hip joint).  Normally this joint experiences very little motion.  When this joint becomes inflamed or unstable low back and or hip and pelvis pain may result.  Injection of this joint with local anesthetics (numbing medicines) and steroids can provide diagnostic information and reduce pain.  This injection is performed with the aid of x-ray guidance into the tailbone area while you are lying on your stomach.   You may experience an electrical sensation down the leg while this is being done.  You may also experience numbness.  We also may ask if we are reproducing your normal pain during the injection.

## 2019-10-10 ENCOUNTER — Encounter: Payer: Self-pay | Admitting: Family Medicine

## 2019-10-10 ENCOUNTER — Ambulatory Visit (INDEPENDENT_AMBULATORY_CARE_PROVIDER_SITE_OTHER): Payer: Medicare Other | Admitting: Family Medicine

## 2019-10-10 ENCOUNTER — Other Ambulatory Visit: Payer: Self-pay

## 2019-10-10 VITALS — BP 144/86 | HR 77 | Ht 67.0 in | Wt 185.8 lb

## 2019-10-10 DIAGNOSIS — G43009 Migraine without aura, not intractable, without status migrainosus: Secondary | ICD-10-CM | POA: Diagnosis not present

## 2019-10-10 DIAGNOSIS — G4700 Insomnia, unspecified: Secondary | ICD-10-CM | POA: Diagnosis not present

## 2019-10-10 DIAGNOSIS — R0683 Snoring: Secondary | ICD-10-CM

## 2019-10-10 DIAGNOSIS — R519 Headache, unspecified: Secondary | ICD-10-CM | POA: Diagnosis not present

## 2019-10-10 MED ORDER — DOXEPIN HCL 100 MG PO CAPS: 200.0000 mg | ORAL_CAPSULE | Freq: Every day | ORAL | 1 refills | Status: DC

## 2019-10-10 NOTE — Progress Notes (Signed)
I have read the note, and I agree with the clinical assessment and plan.  Zuriyah Shatz K Zelena Bushong   

## 2019-10-10 NOTE — Progress Notes (Signed)
PATIENT: [name] DOB: 1951/02/03  REASON FOR VISIT: follow up HISTORY FROM: patient  Chief Complaint  Patient presents with  . Migraine    rm 1, 6 month FU  "when weather changes that's the worst time; doing well overall, better than in the past; need new Rx for doxepin that Dr Jannifer Franklin used refilled for me"     HISTORY OF PRESENT ILLNESS: Today 10/10/19 [name] is a 69 y.o. female here today for follow up for headaches. She continues zonisamide 100mg  daily. She has a 25mg  dose that she rarely takes. She has daily headaches, mostly in the mornings, that are usually easily treatable. She does note that weather changes are a trigger for her. She is able to abort headaches with rest. She may have 1-2 migraines per month.   She continues doxepin 200mg  at night for insomnia. She has been on this medication for years. She reports that she has a difficult time sleeping. She struggles to go to sleep at night and to stay asleep. She does wake frequently at night to use the restroom. She wakes with a dry mouth. She is not sure if she snores. She has a brother who uses CPAP therapy.   HISTORY: (copied from my note on 12/09/2018)  [name] is a 69 y.o. female here today for follow up for migraines. She started Ajovy about a year ago. She felt that it helped initially but after a few months it did not seem to help. She has daily dull headaches, usually behind the right eye. About 1-2 times a month she has more severe headache with light, sound sensitivity and nausea. She continues zonisamide 100mg  and tizanidine 2mg  daily. She takes gabapentin 600mg  1-2 times daily for headaches and dysesthesias in her left side. Doxepin helps with sleep. She is followed closely by PCP for BP and cholesterol management. She is intolerant to statins, now on Repatha. On Plavix.   HISTORY: (copied from [place] note on 03/06/2018)  [name] a 69 year old female with a history of  intractable migraine headaches. She returns today for follow-up. Overall she feels that she has done relatively well. He continues to have a daily dull headache that is in the right temporal region. However she states that she has not had a severe headache in the last 6 months. She reports she does have photophobia with her headaches. She states that ifshe misses her medication thenher headache will worsen. She reports that she was unable to obtain Aimovig. She continues on Zonegran and tizanidine.  HISTORY04/24/19 [name] a 69 year old female with a history of intractable migraine headaches. She returns today for follow-up. She reports that she continues to have a daily dull headache. She remains on Zonegran and tizanidine.She states that she did receive a letter about Aimovigbut was unable to follow-up on this due to her brother being in the hospital. She states that she only has approximately one severe migraine a month. Her headaches seem to occur on the right side starting at the neck radiating to the eyes. She does have photophobia, phonophobia and nausea. She continues to take doxepin at night. She returns today for an evaluation.   REVIEW OF SYSTEMS: Out of a complete 14 system review of symptoms, the patient complains only of the following symptoms, morning headaches, dry mouth, fatigue, chronic pain, insomnia and all other reviewed systems are negative.  ALLERGIES: Allergies  Allergen Reactions  . Shellfish Allergy Anaphylaxis    All seafood  .  Statins Other (See Comments)    Arthralgias (severe) with atorvastatin, mild arthralgias with rosuvastatin but willing to continue taking it    HOME MEDICATIONS: Outpatient Medications Prior to Visit  Medication Sig Dispense Refill  . acetaminophen (TYLENOL) 325 MG tablet Take 2 tablets (650 mg total) by mouth 4 (four) times daily -  with meals and at bedtime.    . Alirocumab (PRALUENT) 75 MG/ML SOAJ Inject 75 mg  into the skin every 14 (fourteen) days. 2 pen 11  . Baclofen 5 MG TABS Take 1 tablet by mouth 3 (three) times daily. 270 tablet 1  . calcium carbonate (OS-CAL - DOSED IN MG OF ELEMENTAL CALCIUM) 1250 (500 Ca) MG tablet Take 1 tablet (500 mg of elemental calcium total) by mouth daily with breakfast.    . cetirizine (ZYRTEC) 10 MG tablet Take 10 mg by mouth daily with breakfast.    . clopidogrel (PLAVIX) 75 MG tablet TAKE 1 TABLET(75 MG) BY MOUTH DAILY 90 tablet 0  . diclofenac Sodium (VOLTAREN) 1 % GEL APPLY 2 GRAMS TOPICALLY TO LEFT HIP FOUR TIMES DAILY 300 g 0  . ELDERBERRY PO Take 1 tablet by mouth daily with breakfast.    . ezetimibe (ZETIA) 10 MG tablet Take 10 mg by mouth daily.     . fluticasone (FLONASE) 50 MCG/ACT nasal spray Place 1 spray into both nostrils daily.  2  . furosemide (LASIX) 40 MG tablet Take 40 mg by mouth daily.    Marland Kitchen gabapentin (NEURONTIN) 300 MG capsule TAKE 2 CAPSULES BY MOUTH TWICE DAILY( 8 AM AND 4 PM) AND 3 CAPSULES AT BEDTIME 210 capsule 1  . Multiple Vitamins-Minerals (HAIR/SKIN/NAILS/BIOTIN) TABS Take 2 tablets by mouth daily with breakfast.    . oxyCODONE (OXY IR/ROXICODONE) 5 MG immediate release tablet Take 1 tablet (5 mg total) by mouth every 6 (six) hours as needed for severe pain. 90 tablet 0  . pantoprazole (PROTONIX) 40 MG tablet Take 1 tablet (40 mg total) by mouth daily with breakfast. 30 tablet 6  . prochlorperazine (COMPAZINE) 10 MG tablet Take 1 tablet (10 mg total) by mouth 2 (two) times daily as needed for nausea or vomiting. 10 tablet 0  . propranolol (INDERAL) 10 MG tablet Take 1 tablet (10 mg total) by mouth 2 (two) times daily. 90 tablet 0  . sertraline (ZOLOFT) 100 MG tablet Take 1 tablet (100 mg total) by mouth daily. 100 tablet 0  . tiZANidine (ZANAFLEX) 2 MG tablet Take 2 mg by mouth 3 (three) times daily.    . Vitamin D, Ergocalciferol, (DRISDOL) 1.25 MG (50000 UNIT) CAPS capsule Take 1 capsule (50,000 Units total) by mouth every 7 (seven)  days. 4 capsule 0  . zonisamide (ZONEGRAN) 100 MG capsule TAKE 1 CAPSULE(100 MG) BY MOUTH TWICE DAILY 60 capsule 2  . zonisamide (ZONEGRAN) 25 MG capsule take 25 mg twice daily prn in addition to 100 mg twice daily. 180 capsule 1  . amLODipine (NORVASC) 2.5 MG tablet Take 1 tablet (2.5 mg total) by mouth daily. CALL OFFICE FOR APPOINTMENT (Patient not taking: Reported on 10/10/2019) 30 tablet 0  . polyethylene glycol (MIRALAX / GLYCOLAX) 17 g packet Take 17 g by mouth daily as needed. (Patient not taking: Reported on 10/10/2019) 14 each 0  . senna (SENOKOT) 8.6 MG TABS tablet Take 1 tablet (8.6 mg total) by mouth daily. (Patient not taking: Reported on 10/10/2019) 120 tablet 0   No facility-administered medications prior to visit.    PAST MEDICAL HISTORY: Past  Medical History:  Diagnosis Date  . Anemia   . Cervical neuritis   . Chronic kidney disease    stage 3  . Dyslipidemia   . GERD (gastroesophageal reflux disease)   . History of transient ischemic attack (TIA)   . HTN (hypertension)   . Hyperlipidemia   . Migraine   . Migraine headache   . Occipital neuralgia    Left  . Pulmonary embolism ([place]) 2017  . Renal insufficiency   . Stroke [facility]) 10/2013    PAST SURGICAL HISTORY: Past Surgical History:  Procedure Laterality Date  . ABDOMINAL HYSTERECTOMY    . BILIARY STENT PLACEMENT N/A 12/12/2015   Procedure: BILIARY STENT PLACEMENT;  Surgeon: Doran Stabler, MD;  Location: [place] ENDOSCOPY;  Service: Endoscopy;  Laterality: N/A;  . BLADDER REPAIR    . CARPAL TUNNEL RELEASE    . CERVICAL FUSION    . CERVICAL SPINE SURGERY    . CHOLECYSTECTOMY N/A 12/06/2015   Procedure: LAPAROSCOPIC CHOLECYSTECTOMY WITH  INTRAOPERATIVE CHOLANGIOGRAM;  Surgeon: Stark Klein, MD;  Location: [place];  Service: General;  Laterality: N/A;  . ELBOW SURGERY    . ERCP N/A 12/12/2015   Procedure: ENDOSCOPIC RETROGRADE CHOLANGIOPANCREATOGRAPHY (ERCP);  Surgeon: Doran Stabler, MD;  Location: [facility] ENDOSCOPY;   Service: Endoscopy;  Laterality: N/A;  . ESOPHAGOGASTRODUODENOSCOPY N/A 01/28/2016   Procedure: ESOPHAGOGASTRODUODENOSCOPY (EGD) Biliary STENT removal;  Surgeon: Doran Stabler, MD;  Location: [facility] ENDOSCOPY;  Service: Gastroenterology;  Laterality: N/A;  . IR GENERIC HISTORICAL  12/17/2015   IR US GUIDE BX ASP/DRAIN 12/17/2015 [facility]  . IR GENERIC HISTORICAL  12/17/2015   IR GUIDED DRAIN W CATHETER PLACEMENT 12/17/2015 [facility]  . IR GENERIC HISTORICAL  12/17/2015   IR SINUS/FIST TUBE CHK-NON GI 12/17/2015 [facility]  . KNEE SURGERY    . LUNG SURGERY    . TEE WITHOUT CARDIOVERSION N/A 12/19/2015   Procedure: TRANSESOPHAGEAL ECHOCARDIOGRAM (TEE);  Surgeon: Josue Hector, MD;  Location: [facility] ENDOSCOPY;  Service: Cardiovascular;  Laterality: N/A;    FAMILY HISTORY: Family History  Problem Relation Age of Onset  . Cancer Mother 53       breast  . Alzheimer's disease Mother   . Prostate cancer Father   . Cancer Brother   . Hyperlipidemia Brother   . Lung cancer Father   . Dementia Maternal Grandmother   . Cancer Maternal Grandfather   . Cancer Brother   . Hyperlipidemia Brother   . Dementia Brother        frontotemporal dementia    SOCIAL HISTORY: Social History   Socioeconomic History  . Marital status: Married    Spouse name: [name]  . Number of children: 1  . Years of education: 12+  . Highest education level: High school graduate  Occupational History  . Occupation: ACTIVITY DIRECTOR    Employer: [employer]  . Occupation: CNA  Tobacco Use  . Smoking status: Former Smoker    Packs/day: 0.30    Types: Cigarettes    Quit date: 11/01/2011    Years since quitting: 7.9  . Smokeless tobacco: Never Used  Vaping Use  . Vaping Use: Never used  Substance and Sexual Activity  . Alcohol use: No    Alcohol/week: 0.0 standard drinks  . Drug use: No  . Sexual activity: Yes    Partners: Male    Birth control/protection: Surgical    Comment: 1 sexual partner  in last 12 months: married  Other Topics Concern  .  Not on file  Social History Narrative   ** Merged History Encounter **       Patient is married ([name]). Patient has one child and grandchild. Her sons name is Shanon Brow. Patient sees them often. 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 Patient spends 6-10 hours with her 6 days a week.     Patient has a college education. Patient is very active in her church.  Hobbies- outside crafts and church choir.   Social Determinants of Health   Financial Resource Strain:   . Difficulty of Paying Living Expenses:   Food Insecurity:   . Worried About Charity fundraiser in the Last Year:   . Arboriculturist in the Last Year:   Transportation Needs:   . Film/video editor (Medical):   Marland Kitchen Lack of Transportation (Non-Medical):   Physical Activity:   . Days of Exercise per Week:   . Minutes of Exercise per Session:   Stress:   . Feeling of Stress :   Social Connections:   . Frequency of Communication with Friends and Family:   . Frequency of Social Gatherings with Friends and Family:   . Attends Religious Services:   . Active Member of Clubs or Organizations:   . Attends Archivist Meetings:   Marland Kitchen Marital Status:   Intimate Partner Violence:   . Fear of Current or Ex-Partner:   . Emotionally Abused:   Marland Kitchen Physically Abused:   . Sexually Abused:       PHYSICAL EXAM  Vitals:   10/10/19 0717  BP: (!) 144/86  Pulse: 77  Weight: 185 lb 12.8 oz (84.3 kg)  Height: 5\' 7"  (1.702 m)   Body mass index is 29.1 kg/m.  Generalized: Well developed, in no acute distress  Cardiology: normal rate and rhythm, no murmur noted Respiratory: clear to auscultation bilaterally  Neurological examination  Mentation: Alert oriented to time, place, history taking. Follows all commands speech and language fluent Cranial nerve II-XII: Pupils were equal round reactive to light. Extraocular movements  were full, visual field were full on confrontational test. Facial sensation and strength were normal. Uvula tongue midline. Head turning and shoulder shrug  were normal and symmetric. Motor: The motor testing reveals 5 over 5 strength of all 4 extremities. Good symmetric motor tone is noted throughout.  Sensory: Sensory testing is intact to soft touch on all 4 extremities. No evidence of extinction is noted.  Coordination: Cerebellar testing reveals good finger-nose-finger and heel-to-shin bilaterally.  Gait and station: Gait is stable with cane    DIAGNOSTIC DATA (LABS, IMAGING, TESTING) - I reviewed patient records, labs, notes, testing and imaging myself where available.  No flowsheet data found.   Lab Results  Component Value Date   WBC 6.8 08/29/2019   HGB 13.4 08/29/2019   HCT 42.6 08/29/2019   MCV 82.2 08/29/2019   PLT 224 08/29/2019      Component Value Date/Time   NA 139 08/29/2019 1030   NA 144 08/25/2019 1351   K 3.2 (L) 08/29/2019 1030   CL 108 08/29/2019 1030   CO2 21 (L) 08/29/2019 1030   GLUCOSE 135 (H) 08/29/2019 1030   BUN 8 08/29/2019 1030   BUN 16 08/25/2019 1351   CREATININE 0.89 08/29/2019 1030   CREATININE 1.10 (H) 02/25/2016 0907   CALCIUM 9.4 08/29/2019 1030   PROT 7.0 08/29/2019 1030   PROT 6.5 08/25/2019 1351   ALBUMIN 4.1  08/29/2019 1030   ALBUMIN 4.4 08/25/2019 1351   AST 24 08/29/2019 1030   ALT 18 08/29/2019 1030   ALKPHOS 159 (H) 08/29/2019 1030   BILITOT 0.8 08/29/2019 1030   BILITOT <0.2 08/25/2019 1351   GFRNONAA >60 08/29/2019 1030   GFRNONAA 53 (L) 02/25/2016 0907   GFRAA >60 08/29/2019 1030   GFRAA 61 02/25/2016 0907   Lab Results  Component Value Date   CHOL 210 (H) 06/13/2019   HDL 48 06/13/2019   LDLCALC 123 (H) 06/13/2019   LDLDIRECT 156 (H) 03/27/2017   TRIG 224 (H) 06/13/2019   CHOLHDL 4.4 06/13/2019   Lab Results  Component Value Date   HGBA1C 5.5 07/30/2017   Lab Results  Component Value Date   VITAMINB12 290  07/21/2017   Lab Results  Component Value Date   TSH 1.610 11/03/2018     ASSESSMENT AND PLAN 69 y.o. year old female  has a past medical history of Anemia, Cervical neuritis, Chronic kidney disease, Dyslipidemia, GERD (gastroesophageal reflux disease), History of transient ischemic attack (TIA), HTN (hypertension), Hyperlipidemia, Migraine, Migraine headache, Occipital neuralgia, Pulmonary embolism ([place]) (2017), Renal insufficiency, and Stroke ([place]) (10/2013). here with     ICD-10-CM   1. Migraine without aura and without status migrainosus, not intractable  G43.009   2. Insomnia, unspecified type  G47.00 Ambulatory referral to Sleep Studies  3. Chronic daily headache  R51.9 Ambulatory referral to Sleep Studies  4. Morning headache  R51.9 Ambulatory referral to Sleep Studies  5. Snoring  R06.83     Marny reports that daily headaches continue, however, migraines seem well managed on zonisamide 100mg  daily. Insomnia is better on doxepin 200mg  at bedtime. We will continue current treatment plan. She does have some concerning symptoms of sleep apnea. I have advised she be seen by out sleep providers to discuss need for sleep study. Referral was placed today. She will continue working on healthy lifestyle habits. We will follow up pending sleep evaluation.    Orders Placed This Encounter  Procedures  . Ambulatory referral to Sleep Studies    Referral Priority:   Routine    Referral Type:   Consultation    Referral Reason:   Specialty Services Required    Number of Visits Requested:   1     Meds ordered this encounter  Medications  . doxepin (SINEQUAN) 100 MG capsule    Sig: Take 2 capsules (200 mg total) by mouth at bedtime.    Dispense:  180 capsule    Refill:  1    Order Specific Question:   Supervising Provider    Answer:   Melvenia Beam [id number]      I spent 30 minutes with the patient. 50% of this time was spent counseling and educating patient on plan of care and  medications.    Debbora Presto, FNP-C 10/10/2019, 9:50 AM [facility] [address], [place], [place] [address] [phone]

## 2019-10-10 NOTE — Patient Instructions (Signed)
We will continue zonisamide 100mg  daily for headache management. Continue doxepin 200mg  daily at bedtime. I am going to refer you to one of our sleep providers for a sleep apnea evaluation.   Stay well hydrated. Try to stay active.   Follow up pending sleep referral.    Sleep Apnea Sleep apnea affects breathing during sleep. It causes breathing to stop for a short time or to become shallow. It can also increase the risk of:  Heart attack.  Stroke.  Being very overweight (obese).  Diabetes.  Heart failure.  Irregular heartbeat. The goal of treatment is to help you breathe normally again. What are the causes? There are three kinds of sleep apnea:  Obstructive sleep apnea. This is caused by a blocked or collapsed airway.  Central sleep apnea. This happens when the brain does not send the right signals to the muscles that control breathing.  Mixed sleep apnea. This is a combination of obstructive and central sleep apnea. The most common cause of this condition is a collapsed or blocked airway. This can happen if:  Your throat muscles are too relaxed.  Your tongue and tonsils are too large.  You are overweight.  Your airway is too small. What increases the risk?  Being overweight.  Smoking.  Having a small airway.  Being older.  Being female.  Drinking alcohol.  Taking medicines to calm yourself (sedatives or tranquilizers).  Having family members with the condition. What are the signs or symptoms?  Trouble staying asleep.  Being sleepy or tired during the day.  Getting angry a lot.  Loud snoring.  Headaches in the morning.  Not being able to focus your mind (concentrate).  Forgetting things.  Less interest in sex.  Mood swings.  Personality changes.  Feelings of sadness (depression).  Waking up a lot during the night to pee (urinate).  Dry mouth.  Sore throat. How is this diagnosed?  Your medical history.  A physical exam.  A test  that is done when you are sleeping (sleep study). The test is most often done in a sleep lab but may also be done at home. How is this treated?   Sleeping on your side.  Using a medicine to get rid of mucus in your nose (decongestant).  Avoiding the use of alcohol, medicines to help you relax, or certain pain medicines (narcotics).  Losing weight, if needed.  Changing your diet.  Not smoking.  Using a machine to open your airway while you sleep, such as: ? An oral appliance. This is a mouthpiece that shifts your lower jaw forward. ? A CPAP device. This device blows air through a mask when you breathe out (exhale). ? An EPAP device. This has valves that you put in each nostril. ? A BPAP device. This device blows air through a mask when you breathe in (inhale) and breathe out.  Having surgery if other treatments do not work. It is important to get treatment for sleep apnea. Without treatment, it can lead to:  High blood pressure.  Coronary artery disease.  In men, not being able to have an erection (impotence).  Reduced thinking ability. Follow these instructions at home: Lifestyle  Make changes that your doctor recommends.  Eat a healthy diet.  Lose weight if needed.  Avoid alcohol, medicines to help you relax, and some pain medicines.  Do not use any products that contain nicotine or tobacco, such as cigarettes, e-cigarettes, and chewing tobacco. If you need help quitting, ask your doctor.  General instructions  Take over-the-counter and prescription medicines only as told by your doctor.  If you were given a machine to use while you sleep, use it only as told by your doctor.  If you are having surgery, make sure to tell your doctor you have sleep apnea. You may need to bring your device with you.  Keep all follow-up visits as told by your doctor. This is important. Contact a doctor if:  The machine that you were given to use during sleep bothers you or does not  seem to be working.  You do not get better.  You get worse. Get help right away if:  Your chest hurts.  You have trouble breathing in enough air.  You have an uncomfortable feeling in your back, arms, or stomach.  You have trouble talking.  One side of your body feels weak.  A part of your face is hanging down. These symptoms may be an emergency. Do not wait to see if the symptoms will go away. Get medical help right away. Call your local emergency services (911 in the [place] Do not drive yourself to the hospital. Summary  This condition affects breathing during sleep.  The most common cause is a collapsed or blocked airway.  The goal of treatment is to help you breathe normally while you sleep. This information is not intended to replace advice given to you by your health care provider. Make sure you discuss any questions you have with your health care provider. Document Revised: 12/11/2017 Document Reviewed: 10/20/2017 Elsevier Patient Education  [place].

## 2019-10-11 DIAGNOSIS — M25562 Pain in left knee: Secondary | ICD-10-CM | POA: Diagnosis not present

## 2019-10-11 DIAGNOSIS — M25561 Pain in right knee: Secondary | ICD-10-CM | POA: Diagnosis not present

## 2019-10-11 DIAGNOSIS — S32592D Other specified fracture of left pubis, subsequent encounter for fracture with routine healing: Secondary | ICD-10-CM | POA: Diagnosis not present

## 2019-10-21 ENCOUNTER — Encounter: Payer: Medicare Other | Admitting: Physical Medicine & Rehabilitation

## 2019-11-01 ENCOUNTER — Encounter: Payer: Self-pay | Admitting: Physical Medicine & Rehabilitation

## 2019-11-01 ENCOUNTER — Other Ambulatory Visit: Payer: Self-pay

## 2019-11-01 ENCOUNTER — Encounter: Payer: Medicare Other | Attending: Physical Medicine & Rehabilitation | Admitting: Physical Medicine & Rehabilitation

## 2019-11-01 VITALS — BP 137/74 | HR 84 | Temp 98.2°F | Ht 67.0 in | Wt 183.6 lb

## 2019-11-01 DIAGNOSIS — S32810S Multiple fractures of pelvis with stable disruption of pelvic ring, sequela: Secondary | ICD-10-CM | POA: Insufficient documentation

## 2019-11-01 DIAGNOSIS — G894 Chronic pain syndrome: Secondary | ICD-10-CM | POA: Diagnosis not present

## 2019-11-01 DIAGNOSIS — M259 Joint disorder, unspecified: Secondary | ICD-10-CM

## 2019-11-01 DIAGNOSIS — Z5181 Encounter for therapeutic drug level monitoring: Secondary | ICD-10-CM | POA: Diagnosis not present

## 2019-11-01 DIAGNOSIS — Z79891 Long term (current) use of opiate analgesic: Secondary | ICD-10-CM | POA: Insufficient documentation

## 2019-11-01 MED ORDER — OXYCODONE HCL 5 MG PO TABS: 5.0000 mg | ORAL_TABLET | Freq: Two times a day (BID) | ORAL | 0 refills | Status: DC | PRN

## 2019-11-01 NOTE — Progress Notes (Signed)
Right sacroiliac joint injection under fluoroscopic guidance  Indication: Right Low back and buttocks pain not relieved by medication management and other conservative care.  Informed consent was obtained after describing risks and benefits of the procedure with the patient, this includes bleeding, bruising, infection, paralysis and medication side effects. The patient wishes to proceed and has given written consent. The patient was placed in a prone position. The lumbar and sacral area was marked and prepped with Betadine. A 25-gauge 1-1/2 inch needle was inserted into the skin and subcutaneous tissue and 1 mL of 1% lidocaine was injected. Then a 25-gauge 3 inch spinal needle was inserted under fluoroscopic guidance into the Right sacroiliac joint. AP and lateral images were utilized. Isovue 200x0.5 mL under live fluoroscopy demonstrated no intravascular uptake. Then a solution containing one ML of 6 mg per mLbetamethasone and 2 ML of 2% lidocaine MPF was injected x1.5 mL. Patient tolerated the procedure well. Post procedure instructions were given. Please see post procedure form. 

## 2019-11-01 NOTE — Progress Notes (Signed)
  PROCEDURE RECORD  Physical Medicine and Rehabilitation   Name: [name] DOB:1950-03-26 MRN: [id number]  Date:11/01/2019  Physician: Alysia Penna, MD    Nurse/CMA:Breezie Micucci Jerline Pain  Allergies:  Allergies  Allergen Reactions  . Shellfish Allergy Anaphylaxis    All seafood  . Statins Other (See Comments)    Arthralgias (severe) with atorvastatin, mild arthralgias with rosuvastatin but willing to continue taking it    Consent Signed: Yes.    Is patient diabetic? No.  CBG today?N/a  Pregnant: No. LMP: No LMP recorded. Patient has had a hysterectomy. (age [age])  Anticoagulants: no Anti-inflammatory: no Antibiotics: no  Procedure: RIght sacroiliac injection Position: Prone Start Time: 12:11 PM End Time: 12:14 PM Fluoro Time:14  RN/CMA Drakkar Medeiros MA Wen Merced MA    Time 11:51 AM 12:19 PM    BP 137/74 130/78    Pulse 83 84    Respirations 16 16    O2 Sat 95 96    S/S 6 6    Pain Level 8/10 0/10     D/C home with SELF, [name] & O X 3, D/C instructions reviewed, and sits independently.

## 2019-11-01 NOTE — Patient Instructions (Signed)
Sacroiliac injection was performed today. A combination of a numbing medicine plus a cortisone medicine was injected. The injection was done under x-ray guidance with contrast enhancement. This procedure has been performed to help reduce low back and buttocks pain as well as potentially hip pain. The duration of this injection is variable lasting from hours to  Months. It may repeated if needed.

## 2019-11-17 ENCOUNTER — Other Ambulatory Visit: Payer: Self-pay | Admitting: Family Medicine

## 2019-11-17 MED ORDER — TIZANIDINE HCL 2 MG PO TABS: 2.0000 mg | ORAL_TABLET | Freq: Three times a day (TID) | ORAL | 1 refills | Status: DC

## 2019-11-17 NOTE — Addendum Note (Signed)
Addended by: Debbora Presto L on: 11/17/2019 02:41 PM   Modules accepted: Orders

## 2019-11-17 NOTE — Telephone Encounter (Signed)
Pt is requesting a refill for tiZANidine (ZANAFLEX) 2 MG tablet .  Pharmacy: [facility] [phone]

## 2019-11-17 NOTE — Addendum Note (Signed)
Addended by: Oliver Hum S on: 11/17/2019 11:06 AM   Modules accepted: Orders

## 2019-11-24 ENCOUNTER — Institutional Professional Consult (permissible substitution): Payer: Medicare Other | Admitting: Neurology

## 2019-11-25 ENCOUNTER — Encounter: Payer: Self-pay | Admitting: Physical Medicine & Rehabilitation

## 2019-11-25 ENCOUNTER — Encounter: Payer: Medicare Other | Attending: Physical Medicine & Rehabilitation | Admitting: Physical Medicine & Rehabilitation

## 2019-11-25 ENCOUNTER — Other Ambulatory Visit: Payer: Self-pay

## 2019-11-25 VITALS — BP 145/88 | HR 83 | Temp 98.8°F | Ht 67.0 in | Wt 183.4 lb

## 2019-11-25 DIAGNOSIS — I2510 Atherosclerotic heart disease of native coronary artery without angina pectoris: Secondary | ICD-10-CM

## 2019-11-25 DIAGNOSIS — I2584 Coronary atherosclerosis due to calcified coronary lesion: Secondary | ICD-10-CM

## 2019-11-25 DIAGNOSIS — G894 Chronic pain syndrome: Secondary | ICD-10-CM

## 2019-11-25 DIAGNOSIS — S32810S Multiple fractures of pelvis with stable disruption of pelvic ring, sequela: Secondary | ICD-10-CM | POA: Diagnosis not present

## 2019-11-25 DIAGNOSIS — Z5181 Encounter for therapeutic drug level monitoring: Secondary | ICD-10-CM | POA: Insufficient documentation

## 2019-11-25 DIAGNOSIS — Z79891 Long term (current) use of opiate analgesic: Secondary | ICD-10-CM | POA: Insufficient documentation

## 2019-11-25 MED ORDER — HYDROCODONE-ACETAMINOPHEN 5-325 MG PO TABS: 1.0000 | ORAL_TABLET | Freq: Two times a day (BID) | ORAL | 0 refills | Status: DC | PRN

## 2019-11-25 NOTE — Patient Instructions (Signed)
Will inject both sacroiliac joints next month  Cont oxycodone until bottle is done, then fill hydrocodone 5mg  twice a day as needed

## 2019-11-25 NOTE — Progress Notes (Signed)
Subjective:    Patient ID: [name], female    DOB: Nov 19, 1950, 69 y.o.   MRN: [id number] 68 y.o. female with history of normocytic anemia, TIAs, HTN, cervical neuritis, migraines a fall 2 weeks 5 admission and pelvic fractures and poor pain control.  She was admitted on 03/06 at 22 with inability to ambulate and difficulty being managed at home.  CT of head was negative for acute changes.  CT pelvic and lumbar spine done for work-up and showed chronic compression fracture of L1 with less than 50% deformity and minimally displaced fracture of left pubis, left acetabular and left sacrum with extension to left SI joint.  Ortho was consulted for input and recommended TTWB RLE.  Gabapentin was added and titrated upwards for pain management.  IV Dilaudid was weaned off and multiple narcotics were added and titrated for better pain control.  Therapy was initiated and patient was noted to have functional deficits and mobility and ADLs.  CIR was recommended for follow-up therapy   Admit date: 05/23/2019 Discharge date: 05/29/2019 HPI  Last visit on 11/01/2019.  Last prescription for oxycodone 5 mg #60 one tablet twice daily written in November 01, 2019, still has 36 tablets left, has been taking twice a day for the last few days Prior to that not using much. No new falls.  Finished PT the end of July.   11/01/2019 Right sacroiliac joint injection under fluoroscopic guidance  RIght Side low back and buttock pain much improved, now mainly notes pain in Left low back and buttock   Pain Inventory Average Pain 8 Pain Right Now 9 My pain is sharp, burning, stabbing, tingling and aching  In the last 24 hours, has pain interfered with the following? General activity 8 Relation with others 8 Enjoyment of life 8 What TIME of day is your pain at its worst? varies Sleep (in general) Poor  Pain is worse with: walking, bending, sitting and standing Pain improves with: rest, heat/ice and medication Relief  from Meds: 8  Family History  Problem Relation Age of Onset  . Cancer Mother 45       breast  . Alzheimer's disease Mother   . Prostate cancer Father   . Cancer Brother   . Hyperlipidemia Brother   . Lung cancer Father   . Dementia Maternal Grandmother   . Cancer Maternal Grandfather   . Cancer Brother   . Hyperlipidemia Brother   . Dementia Brother        frontotemporal dementia   Social History   Socioeconomic History  . Marital status: Married    Spouse name: [name]  . Number of children: 1  . Years of education: 12+  . Highest education level: High school graduate  Occupational History  . Occupation: ACTIVITY DIRECTOR    Employer: [employer]  . Occupation: CNA  Tobacco Use  . Smoking status: Former Smoker    Packs/day: 0.30    Types: Cigarettes    Quit date: 11/01/2011    Years since quitting: 8.0  . Smokeless tobacco: Never Used  Vaping Use  . Vaping Use: Never used  Substance and Sexual Activity  . Alcohol use: No    Alcohol/week: 0.0 standard drinks  . Drug use: No  . Sexual activity: Yes    Partners: Male    Birth control/protection: Surgical    Comment: 1 sexual partner in last 12 months: married  Other Topics Concern  . Not on file  Social History Narrative   **  Merged History Encounter **       Patient is married ([name]). Patient has one child and grandchild. Her sons name is Shanon Brow. Patient sees them often. 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 Patient spends 6-10 hours with her 6 days a week.     Patient has a college education. Patient is very active in her church.  Hobbies- outside crafts and church choir.   Social Determinants of Health   Financial Resource Strain:   . Difficulty of Paying Living Expenses: Not on file  Food Insecurity:   . Worried About Charity fundraiser in the Last Year: Not on file  . Ran Out of Food in the Last Year: Not on file  Transportation Needs:   .  Lack of Transportation (Medical): Not on file  . Lack of Transportation (Non-Medical): Not on file  Physical Activity:   . Days of Exercise per Week: Not on file  . Minutes of Exercise per Session: Not on file  Stress:   . Feeling of Stress : Not on file  Social Connections:   . Frequency of Communication with Friends and Family: Not on file  . Frequency of Social Gatherings with Friends and Family: Not on file  . Attends Religious Services: Not on file  . Active Member of Clubs or Organizations: Not on file  . Attends Archivist Meetings: Not on file  . Marital Status: Not on file   Past Surgical History:  Procedure Laterality Date  . ABDOMINAL HYSTERECTOMY    . BILIARY STENT PLACEMENT N/A 12/12/2015   Procedure: BILIARY STENT PLACEMENT;  Surgeon: Doran Stabler, MD;  Location: [place] [facility];  Service: Endoscopy;  Laterality: N/A;  . BLADDER REPAIR    . CARPAL TUNNEL RELEASE    . CERVICAL FUSION    . CERVICAL SPINE SURGERY    . CHOLECYSTECTOMY N/A 12/06/2015   Procedure: LAPAROSCOPIC CHOLECYSTECTOMY WITH  INTRAOPERATIVE CHOLANGIOGRAM;  Surgeon: Stark Klein, MD;  Location: [place];  Service: General;  Laterality: N/A;  . ELBOW SURGERY    . ERCP N/A 12/12/2015   Procedure: ENDOSCOPIC RETROGRADE CHOLANGIOPANCREATOGRAPHY (ERCP);  Surgeon: Doran Stabler, MD;  Location: [facility]COPY;  Service: Endoscopy;  Laterality: N/A;  . ESOPHAGOGASTRODUODENOSCOPY N/A 01/28/2016   Procedure: ESOPHAGOGASTRODUODENOSCOPY (EGD) Biliary STENT removal;  Surgeon: Doran Stabler, MD;  Location: [facility];  Service: Gastroenterology;  Laterality: N/A;  . IR GENERIC HISTORICAL  12/17/2015   IR US GUIDE BX ASP/DRAIN 12/17/2015 [facility]  . IR GENERIC HISTORICAL  12/17/2015   IR GUIDED DRAIN W CATHETER PLACEMENT 12/17/2015 [facility]  . IR GENERIC HISTORICAL  12/17/2015   IR SINUS/FIST TUBE CHK-NON GI 12/17/2015 [facility]  . KNEE SURGERY    . LUNG SURGERY    . TEE WITHOUT  CARDIOVERSION N/A 12/19/2015   Procedure: TRANSESOPHAGEAL ECHOCARDIOGRAM (TEE);  Surgeon: Josue Hector, MD;  Location: [facility];  Service: Cardiovascular;  Laterality: N/A;   Past Surgical History:  Procedure Laterality Date  . ABDOMINAL HYSTERECTOMY    . BILIARY STENT PLACEMENT N/A 12/12/2015   Procedure: BILIARY STENT PLACEMENT;  Surgeon: Doran Stabler, MD;  Location: [place] [facility];  Service: Endoscopy;  Laterality: N/A;  . BLADDER REPAIR    . CARPAL TUNNEL RELEASE    . CERVICAL FUSION    . CERVICAL SPINE SURGERY    . CHOLECYSTECTOMY N/A 12/06/2015   Procedure: LAPAROSCOPIC CHOLECYSTECTOMY WITH  INTRAOPERATIVE CHOLANGIOGRAM;  Surgeon: Stark Klein, MD;  Location: [place];  Service: General;  Laterality: N/A;  . ELBOW SURGERY    . ERCP N/A 12/12/2015   Procedure: ENDOSCOPIC RETROGRADE CHOLANGIOPANCREATOGRAPHY (ERCP);  Surgeon: Doran Stabler, MD;  Location: [facility] ENDOSCOPY;  Service: Endoscopy;  Laterality: N/A;  . ESOPHAGOGASTRODUODENOSCOPY N/A 01/28/2016   Procedure: ESOPHAGOGASTRODUODENOSCOPY (EGD) Biliary STENT removal;  Surgeon: Doran Stabler, MD;  Location: [facility];  Service: Gastroenterology;  Laterality: N/A;  . IR GENERIC HISTORICAL  12/17/2015   IR US GUIDE BX ASP/DRAIN 12/17/2015 [facility]  . IR GENERIC HISTORICAL  12/17/2015   IR GUIDED DRAIN W CATHETER PLACEMENT 12/17/2015 [facility]  . IR GENERIC HISTORICAL  12/17/2015   IR SINUS/FIST TUBE CHK-NON GI 12/17/2015 [facility]  . KNEE SURGERY    . LUNG SURGERY    . TEE WITHOUT CARDIOVERSION N/A 12/19/2015   Procedure: TRANSESOPHAGEAL ECHOCARDIOGRAM (TEE);  Surgeon: Josue Hector, MD;  Location: [facility];  Service: Cardiovascular;  Laterality: N/A;   Past Medical History:  Diagnosis Date  . Anemia   . Cervical neuritis   . Chronic kidney disease    stage 3  . Dyslipidemia   . GERD (gastroesophageal reflux disease)   . History of transient ischemic attack (TIA)   . HTN (hypertension)   .  Hyperlipidemia   . Migraine   . Migraine headache   . Occipital neuralgia    Left  . Pulmonary embolism ([place]) 2017  . Renal insufficiency   . Stroke ([place]) 10/2013   BP (!) 145/88   Pulse 83   Temp 98.8 F (37.1 C)   Ht 5\' 7"  (1.702 m)   Wt 183 lb 6.4 oz (83.2 kg)   SpO2 90%   BMI 28.72 kg/m   Opioid Risk Score:   Fall Risk Score:  `1  Depression screen PHQ 2/9  Depression screen [facility] 2/9 09/02/2019 08/22/2019 08/05/2019 08/02/2019 07/20/2019 06/28/2019 06/28/2019  Decreased Interest 3 0 1 0 3 3 3   Down, Depressed, Hopeless 3 0 1 0 3 3 3   PHQ - 2 Score 6 0 2 0 6 6 6   Altered sleeping - - - - - 3 -  Tired, decreased energy - - - - - 3 -  Change in appetite - - - - - 3 -  Feeling bad or failure about yourself  - - - - - 2 -  Trouble concentrating - - - - - 2 -  Moving slowly or fidgety/restless - - - - - 1 -  Suicidal thoughts - - - - - 0 -  PHQ-9 Score - - - - - 20 -  Some recent data might be hidden     Review of Systems  Musculoskeletal: Positive for gait problem.  Neurological:       Tingling       Objective:   Physical Exam Vitals and nursing note reviewed.  Constitutional:      Appearance: She is obese.  HENT:     Head: Normocephalic and atraumatic.  Eyes:     Extraocular Movements: Extraocular movements intact.     Conjunctiva/sclera: Conjunctivae normal.     Pupils: Pupils are equal, round, and reactive to light.  Neurological:     Mental Status: She is alert.    Gait she has antalgic gait favoring the right lower extremity.  No evidence of toe drag or knee instability.  Utilizes cane for balance Musculoskeletal Lumbar flexion is 50% lumbar extension is less than 25%  of normal range accompanied by pain. Positive Faber's bilaterally causing pain in the sacroiliac area Hip range of motion with internal/external rotation is full and pain-free. Negative straight leg raising bilaterally. Lower extremity strength is 5/5 bilateral hip flexor knee extensor  ankle dorsiflexor Sensation reported is equal bilateral lower limbs       Assessment & Plan:  #1.  History of fall with complex pelvic fracture involving the left acetabulum sacroiliac joint and superior inferior pubic rami which have healed.  Main issue right now is sacroiliac joints.  She had good relief with right sacroiliac injection which is starting to wear off left sacroiliac area is the most painful will repeat bilateral sacroiliac injections next month. Overall she is able to slowly wean down from narcotic analgesics.  She is down to oxycodone 5 mg p.o. twice daily as needed, she has 36 tablets left After finishing this will reduce narcotic to hydrocodone 5 mg twice daily #60 PDMP reviewed We will need urine toxicology next visit The patient was not on narcotic analgesics prior to her fall and goal is to wean off completely

## 2019-11-28 ENCOUNTER — Other Ambulatory Visit: Payer: Self-pay | Admitting: *Deleted

## 2019-11-28 NOTE — Telephone Encounter (Signed)
I called pt and she is taking zonegran 100mg  po bid.  (my last fill 04-05-19 2 refills).  She states she just picked up.  She does not need it.

## 2019-11-29 ENCOUNTER — Other Ambulatory Visit: Payer: Self-pay | Admitting: Student in an Organized Health Care Education/Training Program

## 2019-11-29 ENCOUNTER — Other Ambulatory Visit: Payer: Self-pay | Admitting: *Deleted

## 2019-11-29 MED ORDER — ZONISAMIDE 100 MG PO CAPS: ORAL_CAPSULE | ORAL | 5 refills | Status: DC

## 2019-12-01 ENCOUNTER — Telehealth: Payer: Self-pay

## 2019-12-01 ENCOUNTER — Other Ambulatory Visit: Payer: Self-pay | Admitting: Student in an Organized Health Care Education/Training Program

## 2019-12-01 NOTE — Telephone Encounter (Signed)
Patient calls nurse line stating she needs a refill on Sertraline 50mg . Patient reports she has Sertraline 25mg , however she needs he "day" dose. Please advise.

## 2019-12-02 ENCOUNTER — Other Ambulatory Visit: Payer: Self-pay | Admitting: Student in an Organized Health Care Education/Training Program

## 2019-12-02 LAB — DRUG TOX MONITOR 1 W/CONF, ORAL FLD
Amphetamines: NEGATIVE ng/mL (ref <10)
Barbiturates: NEGATIVE ng/mL (ref <10)
Benzodiazepines: NEGATIVE ng/mL (ref <0.50)
Buprenorphine: NEGATIVE ng/mL (ref <0.10)
Buprenorphine: NEGATIVE ng/mL (ref <0.10)
Cocaine: NEGATIVE ng/mL (ref <5.0)
Codeine: NEGATIVE ng/mL (ref <2.5)
Dihydrocodeine: NEGATIVE ng/mL (ref <2.5)
Fentanyl: NEGATIVE ng/mL (ref <0.10)
Heroin Metabolite: NEGATIVE ng/mL (ref <1.0)
Hydrocodone: NEGATIVE ng/mL (ref <2.5)
Hydromorphone: NEGATIVE ng/mL (ref <2.5)
MARIJUANA: NEGATIVE ng/mL (ref <2.5)
MDMA: NEGATIVE ng/mL (ref <10)
Meprobamate: NEGATIVE ng/mL (ref <2.5)
Methadone: NEGATIVE ng/mL (ref <5.0)
Morphine: NEGATIVE ng/mL (ref <2.5)
Naloxone: NEGATIVE ng/mL (ref <0.25)
Nicotine Metabolite: NEGATIVE ng/mL (ref <5.0)
Norbuprenorphine: NEGATIVE ng/mL (ref <0.50)
Norhydrocodone: NEGATIVE ng/mL (ref <2.5)
Noroxycodone: 3.5 ng/mL — ABNORMAL HIGH (ref <2.5)
Opiates: POSITIVE ng/mL — AB (ref <2.5)
Oxycodone: 12.8 ng/mL — ABNORMAL HIGH (ref <2.5)
Oxymorphone: NEGATIVE ng/mL (ref <2.5)
Phencyclidine: NEGATIVE ng/mL (ref <10)
Tapentadol: NEGATIVE ng/mL (ref <5.0)
Tramadol: NEGATIVE ng/mL (ref <5.0)
Zolpidem: NEGATIVE ng/mL (ref <5.0)

## 2019-12-02 LAB — DRUG TOX ALC METAB W/CON, ORAL FLD: Alcohol Metabolite: NEGATIVE ng/mL (ref <25)

## 2019-12-02 MED ORDER — SERTRALINE HCL 50 MG PO TABS: 50.0000 mg | ORAL_TABLET | Freq: Every day | ORAL | 1 refills | Status: DC

## 2019-12-02 MED ORDER — TRAZODONE HCL 50 MG PO TABS: 25.0000 mg | ORAL_TABLET | Freq: Every evening | ORAL | 0 refills | Status: DC | PRN

## 2019-12-02 NOTE — Progress Notes (Signed)
Refilled sertraline 50mg  daily and counseled patient on once daily dosing. Discussed other sleep aids with patient. She is interested in trying trazadone PRN. Filling 30 day supply and instructed patient to follow up with appointment to discuss in 1 month. She was agreeable to plan.

## 2019-12-02 NOTE — Telephone Encounter (Signed)
I have discussed with patient multiple times that this medication is once daily dosing. I can call her to discuss again.

## 2019-12-06 ENCOUNTER — Telehealth: Payer: Self-pay | Admitting: *Deleted

## 2019-12-06 NOTE — Telephone Encounter (Signed)
Oral swab drug screen was consistent for prescribed medications.  ?

## 2019-12-14 ENCOUNTER — Other Ambulatory Visit: Payer: Self-pay

## 2019-12-14 ENCOUNTER — Encounter: Payer: Self-pay | Admitting: Cardiovascular Disease

## 2019-12-14 ENCOUNTER — Ambulatory Visit (INDEPENDENT_AMBULATORY_CARE_PROVIDER_SITE_OTHER): Payer: Medicare Other | Admitting: Cardiovascular Disease

## 2019-12-14 VITALS — BP 122/86 | HR 95 | Ht 66.5 in | Wt 182.0 lb

## 2019-12-14 DIAGNOSIS — I739 Peripheral vascular disease, unspecified: Secondary | ICD-10-CM

## 2019-12-14 DIAGNOSIS — R0602 Shortness of breath: Secondary | ICD-10-CM | POA: Diagnosis not present

## 2019-12-14 DIAGNOSIS — I2584 Coronary atherosclerosis due to calcified coronary lesion: Secondary | ICD-10-CM | POA: Diagnosis not present

## 2019-12-14 DIAGNOSIS — I6523 Occlusion and stenosis of bilateral carotid arteries: Secondary | ICD-10-CM | POA: Diagnosis not present

## 2019-12-14 DIAGNOSIS — I952 Hypotension due to drugs: Secondary | ICD-10-CM

## 2019-12-14 DIAGNOSIS — R062 Wheezing: Secondary | ICD-10-CM | POA: Diagnosis not present

## 2019-12-14 DIAGNOSIS — I10 Essential (primary) hypertension: Secondary | ICD-10-CM | POA: Diagnosis not present

## 2019-12-14 DIAGNOSIS — G459 Transient cerebral ischemic attack, unspecified: Secondary | ICD-10-CM

## 2019-12-14 DIAGNOSIS — I2510 Atherosclerotic heart disease of native coronary artery without angina pectoris: Secondary | ICD-10-CM | POA: Diagnosis not present

## 2019-12-14 MED ORDER — PRALUENT 75 MG/ML ~~LOC~~ SOAJ: 75.0000 mg | SUBCUTANEOUS | 3 refills | Status: DC

## 2019-12-14 NOTE — Patient Instructions (Signed)
Medication Instructions:  RESUME YOUR PRALUENT  *If you need a refill on your cardiac medications before your next appointment, please call your pharmacy  Lab Work: COVID SCREENIG 3 [place] PFT'S  If you have labs (blood work) drawn today and your tests are completely normal, you will receive your results only by: Marland Kitchen MyChart Message (if you have MyChart) OR . A paper copy in the mail If you have any lab test that is abnormal or we need to change your treatment, we will call you to review the results.  Testing/Procedures: Your physician has requested that you have a lexiscan myoview. For further information please visit [email or web address]. Please follow instruction sheet, as given.  Your physician has requested that you have a carotid duplex. This test is an ultrasound of the carotid arteries in your neck. It looks at blood flow through these arteries that supply the brain with blood. Allow one hour for this exam. There are no restrictions or special instructions.  Your physician has recommended that you have a pulmonary function test. Pulmonary Function Tests are a group of tests that measure how well air moves in and out of your lungs.  Follow-Up: At [facility], you and your health needs are our priority.  As part of our continuing mission to provide you with exceptional heart care, we have created designated Provider Care Teams.  These Care Teams include your primary Cardiologist (physician) and Advanced Practice Providers (APPs -  Physician Assistants and Nurse Practitioners) who all work together to provide you with the care you need, when you need it.  We recommend signing up for the patient portal called "MyChart".  Sign up information is provided on this After Visit Summary.  MyChart is used to connect with patients for Virtual Visits (Telemedicine).  Patients are able to view lab/test results, encounter notes, upcoming appointments, etc.  Non-urgent messages can be sent  to your provider as well.   To learn more about what you can do with MyChart, go to [email or web address].    Your next appointment:   2 month(s)  The format for your next appointment:   In Person  Provider:   You may see DR [facility] or one of the following Advanced Practice Providers on your designated Care Team:    Kerin Ransom, PA-C  [place], [place]  Coletta Memos,   Other Instructions   Pulmonary Function Tests Pulmonary function tests (PFTs) are used to measure how well your lungs work, find out what is causing your lung problems, and figure out the best treatment for you. You may have PFTs:  When you have an illness involving the lungs.  To follow changes in your lung function over time if you have a chronic lung disease.  If you are an Nature conservation officer. This checks the effects of being exposed to chemicals over a long period of time.  To check lung function before having surgery or other procedures.  To check your lungs if you smoke.  To check if prescribed medicines or treatments are helping your lungs. Your results will be compared to the expected lung function of someone with healthy lungs who is similar to you in:  Age.  Gender.  Height.  Weight.  Race or ethnicity. This is done to show how your lungs compare to normal lung function (percent predicted). This is how your health care provider knows if your lung function is normal or not. If you have had PFTs done before, your health care provider  will compare your current results with past results. This shows if your lung function is better, worse, or the same as before. Tell a health care provider about:  Any allergies you have.  All medicines you are taking, including inhaler or nebulizer medicines, vitamins, herbs, eye drops, creams, and over-the-counter medicines.  Any blood disorders you have.  Any surgeries you have had, especially recent eye surgery, abdominal surgery, or chest  surgery. These can make PFTs difficult or unsafe.  Any medical conditions you have, including chest pain or heart problems, tuberculosis, or respiratory infections such as pneumonia, a cold, or the flu.  Any fear of being in closed spaces (claustrophobia). Some of your tests may be in a closed space. What are the risks? Generally, this is a safe procedure. However, problems may occur, including:  Light-headedness due to over-breathing (hyperventilation).  An asthma attack from deep breathing.  A collapsed lung. What happens before the procedure?  Take over-the-counter and prescription medicines only as told by your health care provider. If you take inhaler or nebulizer medicines, ask your health care provider which medicines you should take on the day of your testing. Some inhaler medicines may interfere with PFTs if they are taken shortly before the tests.  Follow your health care provider's instructions on eating and drinking restrictions. This may include avoiding eating large meals and drinking alcohol before the testing.  Do not use any products that contain nicotine or tobacco, such as cigarettes and e-cigarettes. If you need help quitting, ask your health care provider.  Wear comfortable clothing that will not interfere with breathing. What happens during the procedure?   You will be given a soft nose clip to wear. This is done so all of your breaths will go through your mouth instead of your nose.  You will be given a germ-free (sterile) mouthpiece. It will be attached to a machine that measures your breathing (spirometer).  You will be asked to do various breathing maneuvers. The maneuvers will be done by breathing in (inhaling) and breathing out (exhaling). You may be asked to repeat the maneuvers several times before the testing is done.  It is important to follow the instructions exactly to get accurate results. Make sure to blow as hard and as fast as you can when you are  told to do so.  You may be given a medicine that makes the small air passages in your lungs larger (bronchodilator) after testing has been done. This medicine will make it easier for you to breathe.  The tests will be repeated after the bronchodilator has taken effect.  You will be monitored carefully during the procedure for faintness, dizziness, trouble breathing, or any other problems. The procedure may vary among health care providers and hospitals. What happens after the procedure?  It is up to you to get your test results. Ask your health care provider, or the department that is doing the tests, when your results will be ready. After you have received your test results, talk with your health care provider about treatment options, if necessary. Summary  Pulmonary function tests (PFTs) are used to measure how well your lungs work, find out what is causing your lung problems, and figure out the best treatment for you.  Wear comfortable clothing that will not interfere with breathing.  It is up to you to get your test results. After you have received them, talk with your health care provider about treatment options, if necessary. This information is not intended to  replace advice given to you by your health care provider. Make sure you discuss any questions you have with your health care provider. Document Revised: 02/22/2016 Document Reviewed: 01/17/2016 Elsevier Patient Education  2020 [employer].

## 2019-12-14 NOTE — Progress Notes (Signed)
Cardiology Office Note   Date:  12/14/2019   ID:  [name], DOB [birth year], MRN [id number]  PCP:  Richarda Osmond, DO  Cardiologist:   Skeet Latch, MD   No chief complaint on file.    History of Present Illness: [name] is a 69 y.o. female with asymptomatic coronary calcification, L subclavian stenosis, hypertension, hyperlipidemia, CKD 3, and stroke here for follow-up.  She was admitted 07/2017 with shortness of breath.  LE Doppler was negative for DVT.  V/Q scan showed a wedge shaped perfusion defect but was deemed intermediate probability.  There was no evidence of RV strain on echo.  He was started on Eliquis for anticoagulation.  She has some lower extremity edema that improves somewhat when she takes lasix or when she elevates them.  She still has shortness of brewath that she states has been present since her stroke.  It is worse when she goes up and down steps or when she gets in the shower.  She tries to walk for 25 minutes daily.  She followed up with Rosaria Ferries, PA-C on 08/2017 and was referred for [facility] to rule out ischemia.  Lexiscan Myoview 09/2017 revealed LVEF 66% and no ischemia.  [name] previously smoked for 25-30 years.  She also had a lung puncture injury that required repeat thoracentesis and drain placement.  Her BP at home has been running around 130/80.  She has not tolerated several statins in the past.  Lately [name] has been more short of breath.  She also notes that she is wheezing whenever she walks around.  She denies any exertional chest pain or pressure.  She does try to walk around her block every day.  She has some occasional lower extremity edema.  When this occurs she takes an extra Lasix and that seems to help.  She denies orthopnea or PND.  She was given some fluticasone nasal spray and that did help for a while but the wheezing recurred.  She quit smoking in 2013.  She does not think she is ever seen a  pulmonologist.  She notes that when she was in the hospital with a hip fracture her [name] was discontinued.  She is unsure why it was stopped.   Past Medical History:  Diagnosis Date  . Anemia   . Cervical neuritis   . Chronic kidney disease    stage 3  . Dyslipidemia   . GERD (gastroesophageal reflux disease)   . History of transient ischemic attack (TIA)   . HTN (hypertension)   . Hyperlipidemia   . Migraine   . Migraine headache   . Occipital neuralgia    Left  . Pulmonary embolism ([place]) 2017  . Renal insufficiency   . Stroke [facility]) 10/2013     Current Outpatient Medications  Medication Sig Dispense Refill  . acetaminophen (TYLENOL) 325 MG tablet Take 2 tablets (650 mg total) by mouth 4 (four) times daily -  with meals and at bedtime.    . Alirocumab ([name]) 75 MG/ML SOAJ Inject 75 mg into the skin every 14 (fourteen) days. 6 mL 3  . amLODipine (NORVASC) 2.5 MG tablet Take 1 tablet (2.5 mg total) by mouth daily. CALL OFFICE FOR APPOINTMENT 30 tablet 0  . Baclofen 5 MG TABS Take 1 tablet by mouth 3 (three) times daily. 270 tablet 1  . calcium carbonate (OS-CAL - DOSED IN MG OF ELEMENTAL CALCIUM) 1250 (500 Ca) MG tablet Take 1 tablet (500  mg of elemental calcium total) by mouth daily with breakfast.    . cetirizine (ZYRTEC) 10 MG tablet Take 10 mg by mouth daily with breakfast.    . clopidogrel (PLAVIX) 75 MG tablet TAKE 1 TABLET(75 MG) BY MOUTH DAILY 90 tablet 0  . diclofenac Sodium (VOLTAREN) 1 % GEL APPLY 2 GRAMS TOPICALLY TO LEFT HIP FOUR TIMES DAILY 300 g 0  . ELDERBERRY PO Take 1 tablet by mouth daily with breakfast.    . ezetimibe (ZETIA) 10 MG tablet Take 10 mg by mouth daily.     . fluticasone (FLONASE) 50 MCG/ACT nasal spray Place 1 spray into both nostrils daily.  2  . furosemide (LASIX) 40 MG tablet Take 40 mg by mouth daily.    Marland Kitchen gabapentin (NEURONTIN) 300 MG capsule TAKE 2 CAPSULES BY MOUTH TWICE DAILY( 8 AM AND 4 PM) AND 3 CAPSULES AT BEDTIME 210 capsule 1   . HYDROcodone-acetaminophen (NORCO/VICODIN) 5-325 MG tablet Take 1 tablet by mouth 2 (two) times daily as needed for moderate pain or severe pain. 60 tablet 0  . Multiple Vitamins-Minerals (HAIR/SKIN/NAILS/BIOTIN) TABS Take 2 tablets by mouth daily with breakfast.    . pantoprazole (PROTONIX) 40 MG tablet Take 1 tablet (40 mg total) by mouth daily with breakfast. 30 tablet 6  . polyethylene glycol (MIRALAX / GLYCOLAX) 17 g packet Take 17 g by mouth daily as needed. 14 each 0  . prochlorperazine (COMPAZINE) 10 MG tablet Take 1 tablet (10 mg total) by mouth 2 (two) times daily as needed for nausea or vomiting. 10 tablet 0  . propranolol (INDERAL) 10 MG tablet TAKE 1 TABLET(10 MG) BY MOUTH TWICE DAILY 90 tablet 0  . senna (SENOKOT) 8.6 MG TABS tablet Take 1 tablet (8.6 mg total) by mouth daily. 120 tablet 0  . sertraline (ZOLOFT) 50 MG tablet Take 1 tablet (50 mg total) by mouth daily. 60 tablet 1  . tiZANidine (ZANAFLEX) 2 MG tablet Take 1 tablet (2 mg total) by mouth 3 (three) times daily. 270 tablet 1  . traZODone (DESYREL) 50 MG tablet Take 0.5-1 tablets (25-50 mg total) by mouth at bedtime as needed for sleep. 30 tablet 0  . Vitamin D, Ergocalciferol, (DRISDOL) 1.25 MG (50000 UNIT) CAPS capsule Take 1 capsule (50,000 Units total) by mouth every 7 (seven) days. 4 capsule 0  . zonisamide (ZONEGRAN) 100 MG capsule TAKE 1 CAPSULE(100 MG) BY MOUTH TWICE DAILY 60 capsule 5  . zonisamide (ZONEGRAN) 25 MG capsule take 25 mg twice daily prn in addition to 100 mg twice daily. 180 capsule 1   No current facility-administered medications for this visit.    Allergies:   Shellfish allergy and Statins    Social History:  The patient  reports that she quit smoking about 8 years ago. Her smoking use included cigarettes. She smoked 0.30 packs per day. She has never used smokeless tobacco. She reports that she does not drink alcohol and does not use drugs.   Family History:  The patient's family history  includes Alzheimer's disease in her mother; Cancer in her brother, brother, and maternal grandfather; Cancer (age of onset: 41) in her mother; Dementia in her brother and maternal grandmother; Hyperlipidemia in her brother and brother; Lung cancer in her father; Prostate cancer in her father.    ROS:  Please see the history of present illness.   Otherwise, review of systems are positive for none.   All other systems are reviewed and negative.    PHYSICAL EXAM: VS:  BP 122/86   Pulse 95   Ht 5' 6.5" (1.689 m)   Wt 182 lb (82.6 kg)   SpO2 96%   BMI 28.94 kg/m  , BMI Body mass index is 28.94 kg/m. GENERAL:  Well appearing HEENT: Pupils equal round and reactive, fundi not visualized, oral mucosa unremarkable NECK:  No jugular venous distention, waveform within normal limits, carotid upstroke brisk and symmetric, no bruits LUNGS:  Clear to auscultation bilaterally HEART:  RRR.  PMI not displaced or sustained,S1 and S2 within normal limits, no S3, no S4, no clicks, no rubs, no murmurs ABD:  Flat, positive bowel sounds normal in frequency in pitch, no bruits, no rebound, no guarding, no midline pulsatile mass, no hepatomegaly, no splenomegaly EXT:  2 plus pulses throughout, trace edema, no cyanosis no clubbing SKIN:  No rashes no nodules NEURO:  Cranial nerves II through XII grossly intact, motor grossly intact throughout PSYCH:  Cognitively intact, oriented to person place and time   EKG:  EKG is not ordered today.   Echo 07/24/17: Study Conclusions  - Left ventricle: The cavity size was normal. Systolic function was   normal. The estimated ejection fraction was in the range of 55%   to 60%. Wall motion was normal; there were no regional wall   motion abnormalities. Doppler parameters are consistent with   abnormal left ventricular relaxation (grade 1 diastolic   dysfunction).  Lexiscan Myoview 09/18/17:   The left ventricular ejection fraction is hyperdynamic (>65%).  Nuclear  stress EF: 66%.  There was no ST segment deviation noted during stress.  The study is normal.  This is a low risk study.   Low risk stress nuclear study with normal perfusion and normal left ventricular regional and global systolic function.    Recent Labs: 07/31/2019: B Natriuretic Peptide 66.3; Magnesium 2.1 08/29/2019: ALT 18; BUN 8; Creatinine, Ser 0.89; Hemoglobin 13.4; Platelets 224; Potassium 3.2; Sodium 139    Lipid Panel    Component Value Date/Time   CHOL 210 (H) 06/13/2019 0927   TRIG 224 (H) 06/13/2019 0927   HDL 48 06/13/2019 0927   CHOLHDL 4.4 06/13/2019 0927   CHOLHDL 2.2 07/13/2015 1056   VLDL 10 07/13/2015 1056   LDLCALC 123 (H) 06/13/2019 0927   LDLDIRECT 156 (H) 03/27/2017 0939     07/30/2017: Hemoglobin A1c 5.5% Hemoglobin 12.9, platelets 196 09/25/2017: Potassium 3.9, creatinine 1.4  Wt Readings from Last 3 Encounters:  12/14/19 182 lb (82.6 kg)  11/25/19 183 lb 6.4 oz (83.2 kg)  11/01/19 183 lb 9.6 oz (83.3 kg)      ASSESSMENT AND PLAN:  # Prior stroke: # Carotid stenosis: Continue clopidogrel.  Resume [name].  She will need repeat lipids and a CMP in January.  We will order that at her follow-up appointment.  Repeat carotid Dopplers.  # Shortness of breath: # Wheezing:  Likely lung related.  Symptoms haven't improved after treatment for her PE.  She has been short of breath since her TIA in 2015.  She has 25-30 pack year history of smoking and prior lung injury requiring thoracentesis and drain placement.  Lexiscan Myoview was negative for ischemia in 2019.  Given that her symptoms are worsening we will repeat her Bernard.  However we will also get pulmonary function test.  If these are abnormal she will need a referral to pulmonary.  # Coronary calcification:  # Hyperlipidemia:  Moderate coronary calcification on chest CT as above.  We are getting a [employer].  We will reorder her [name] which was previously  discontinued.  Continue Zetia.  She has not tolerated statins due to myalgias.  # Prior PE: She completed 6 months ofEliquis.  # Hypertension: BP is well-controlled.   Current medicines are reviewed at length with the patient today.  The patient does not have concerns regarding medicines.  The following changes have been made: Resume [name]  Labs/ tests ordered today include:   Orders Placed This Encounter  Procedures  . MYOCARDIAL PERFUSION IMAGING  . Pulmonary function test  . VAS US CAROTID     Disposition:   FU with [name] C. Oval Linsey, MD, [facility] in 2 months.    Signed, Noheli Melder C. Oval Linsey, MD, [facility]  12/14/2019 8:43 AM    [place]

## 2019-12-16 ENCOUNTER — Telehealth (HOSPITAL_COMMUNITY): Payer: Self-pay | Admitting: *Deleted

## 2019-12-16 NOTE — Telephone Encounter (Signed)
Close encounter 

## 2019-12-20 ENCOUNTER — Ambulatory Visit (HOSPITAL_COMMUNITY)
Admission: RE | Admit: 2019-12-20 | Discharge: 2019-12-20 | Disposition: A | Discharge status: Home / Self Care | Payer: Medicare Other | Source: Ambulatory Visit | Attending: Cardiovascular Disease | Admitting: Cardiovascular Disease

## 2019-12-20 ENCOUNTER — Other Ambulatory Visit: Payer: Self-pay

## 2019-12-20 ENCOUNTER — Other Ambulatory Visit (HOSPITAL_COMMUNITY): Payer: PRIVATE HEALTH INSURANCE

## 2019-12-20 DIAGNOSIS — R0602 Shortness of breath: Secondary | ICD-10-CM | POA: Insufficient documentation

## 2019-12-20 LAB — MYOCARDIAL PERFUSION IMAGING
LV dias vol: 84 mL (ref 46–106)
LV sys vol: 29 mL
Peak HR: 89 {beats}/min
Rest HR: 72 {beats}/min
SDS: 1
SRS: 4
SSS: 5
TID: 1.16

## 2019-12-20 MED ORDER — REGADENOSON 0.4 MG/5ML IV SOLN
0.4000 mg | Freq: Once | INTRAVENOUS | Status: AC
  Administered 2019-12-20: 0.4 mg via INTRAVENOUS

## 2019-12-20 MED ORDER — TECHNETIUM TC 99M TETROFOSMIN IV KIT
31.4000 | PACK | Freq: Once | INTRAVENOUS | Status: AC | PRN
  Administered 2019-12-20: 31.4 via INTRAVENOUS
  Filled 2019-12-20: qty 32

## 2019-12-20 MED ORDER — TECHNETIUM TC 99M TETROFOSMIN IV KIT
10.6000 | PACK | Freq: Once | INTRAVENOUS | Status: AC | PRN
  Administered 2019-12-20: 10.6 via INTRAVENOUS
  Filled 2019-12-20: qty 11

## 2019-12-23 ENCOUNTER — Other Ambulatory Visit: Payer: Self-pay

## 2019-12-23 ENCOUNTER — Encounter: Payer: Medicare Other | Attending: Physical Medicine & Rehabilitation | Admitting: Physical Medicine & Rehabilitation

## 2019-12-23 ENCOUNTER — Encounter (HOSPITAL_COMMUNITY): Payer: PRIVATE HEALTH INSURANCE

## 2019-12-23 ENCOUNTER — Encounter: Payer: Self-pay | Admitting: Physical Medicine & Rehabilitation

## 2019-12-23 ENCOUNTER — Other Ambulatory Visit (HOSPITAL_COMMUNITY)
Admission: RE | Admit: 2019-12-23 | Discharge: 2019-12-23 | Disposition: A | Discharge status: Home / Self Care | Payer: Medicare Other | Source: Ambulatory Visit | Attending: Cardiovascular Disease | Admitting: Cardiovascular Disease

## 2019-12-23 VITALS — Temp 98.4°F | Ht 67.0 in | Wt 185.4 lb

## 2019-12-23 DIAGNOSIS — M533 Sacrococcygeal disorders, not elsewhere classified: Secondary | ICD-10-CM | POA: Diagnosis not present

## 2019-12-23 DIAGNOSIS — Z20822 Contact with and (suspected) exposure to covid-19: Secondary | ICD-10-CM | POA: Diagnosis not present

## 2019-12-23 DIAGNOSIS — Z01812 Encounter for preprocedural laboratory examination: Secondary | ICD-10-CM | POA: Diagnosis not present

## 2019-12-23 LAB — SARS CORONAVIRUS 2 (TAT 6-24 HRS): SARS Coronavirus 2: NEGATIVE

## 2019-12-23 NOTE — Progress Notes (Signed)

## 2019-12-23 NOTE — Progress Notes (Signed)
  PROCEDURE RECORD [facility]   Name: [name] DOB:[birth year] MRN: [id number]  Date:12/23/2019  Physician: Alysia Penna, MD    Nurse/CMA: Jorja Loa MA  Allergies:  Allergies  Allergen Reactions  . Shellfish Allergy Anaphylaxis    All seafood  . Statins Other (See Comments)    Arthralgias (severe) with atorvastatin, mild arthralgias with rosuvastatin but willing to continue taking it    Consent Signed: Yes.    Is patient diabetic? No.  CBG today? N/A Pregnant: No. LMP: No LMP recorded. Patient has had a hysterectomy. (age [age])  Anticoagulants: Stopped plavix 3 days ago Anti-inflammatory: no Antibiotics: no  Procedure: Bilat Sacroiliac injection Position: Prone Start Time: 10:00 AM End Time: 10:08 AM Fluoro Time: 60  RN/CMA Conard Alvira MA Tailey Top MA    Time 9:43 am 10:17 am    BP 109/74 114/63    Pulse 70 70    Respirations 16 14    O2 Sat 96 96    S/S 6 6    Pain Level 8/10 0/10     D/C home with Husband, [name] & O X 3, D/C instructions reviewed, and sits independently.

## 2019-12-23 NOTE — Patient Instructions (Signed)
Sacroiliac injection was performed today. A combination of a naming medicine plus a cortisone medicine was injected. The injection was done under x-ray guidance. This procedure has been performed to help reduce low back and buttocks pain as well as potentially hip pain. The duration of this injection is variable lasting from hours to  Months. It may repeated if needed. 

## 2019-12-26 ENCOUNTER — Other Ambulatory Visit: Payer: Self-pay

## 2019-12-26 ENCOUNTER — Ambulatory Visit (HOSPITAL_COMMUNITY)
Admission: RE | Admit: 2019-12-26 | Discharge: 2019-12-26 | Disposition: A | Discharge status: Home / Self Care | Payer: Medicare Other | Source: Ambulatory Visit | Attending: Cardiovascular Disease | Admitting: Cardiovascular Disease

## 2019-12-26 DIAGNOSIS — R0602 Shortness of breath: Secondary | ICD-10-CM | POA: Insufficient documentation

## 2019-12-26 DIAGNOSIS — R062 Wheezing: Secondary | ICD-10-CM | POA: Diagnosis not present

## 2019-12-26 LAB — PULMONARY FUNCTION TEST
DL/VA % pred: 88 %
DL/VA: 3.61 ml/min/mmHg/L
DLCO unc % pred: 54 %
DLCO unc: 11.39 ml/min/mmHg
FEF 25-75 Post: 2.71 L/sec
FEF 25-75 Pre: 1.98 L/sec
FEF2575-%Change-Post: 36 %
FEF2575-%Pred-Post: 128 %
FEF2575-%Pred-Pre: 94 %
FEV1-%Change-Post: 6 %
FEV1-%Pred-Post: 78 %
FEV1-%Pred-Pre: 73 %
FEV1-Post: 1.99 L
FEV1-Pre: 1.86 L
FEV1FVC-%Change-Post: 2 %
FEV1FVC-%Pred-Pre: 107 %
FEV6-%Change-Post: 6 %
FEV6-%Pred-Post: 74 %
FEV6-%Pred-Pre: 69 %
FEV6-Post: 2.35 L
FEV6-Pre: 2.21 L
FEV6FVC-%Change-Post: 2 %
FEV6FVC-%Pred-Post: 103 %
FEV6FVC-%Pred-Pre: 101 %
FVC-%Change-Post: 4 %
FVC-%Pred-Post: 71 %
FVC-%Pred-Pre: 68 %
FVC-Post: 2.37 L
FVC-Pre: 2.27 L
Post FEV1/FVC ratio: 84 %
Post FEV6/FVC ratio: 99 %
Pre FEV1/FVC ratio: 82 %
Pre FEV6/FVC Ratio: 97 %
RV % pred: 72 %
RV: 1.63 L
TLC % pred: 74 %
TLC: 3.98 L

## 2019-12-26 MED ORDER — ALBUTEROL SULFATE (2.5 MG/3ML) 0.083% IN NEBU
2.5000 mg | INHALATION_SOLUTION | Freq: Once | RESPIRATORY_TRACT | Status: AC
  Administered 2019-12-26: 2.5 mg via RESPIRATORY_TRACT

## 2019-12-27 ENCOUNTER — Other Ambulatory Visit: Payer: Self-pay | Admitting: *Deleted

## 2019-12-27 DIAGNOSIS — R0602 Shortness of breath: Secondary | ICD-10-CM

## 2019-12-29 ENCOUNTER — Encounter (HOSPITAL_COMMUNITY): Payer: PRIVATE HEALTH INSURANCE

## 2019-12-30 ENCOUNTER — Encounter (HOSPITAL_COMMUNITY): Payer: Medicare Other

## 2020-01-02 ENCOUNTER — Other Ambulatory Visit: Payer: Self-pay | Admitting: Student in an Organized Health Care Education/Training Program

## 2020-01-03 ENCOUNTER — Encounter: Payer: Self-pay | Admitting: Student in an Organized Health Care Education/Training Program

## 2020-01-03 ENCOUNTER — Other Ambulatory Visit: Payer: Self-pay

## 2020-01-03 ENCOUNTER — Ambulatory Visit (INDEPENDENT_AMBULATORY_CARE_PROVIDER_SITE_OTHER): Payer: Medicare Other | Admitting: Student in an Organized Health Care Education/Training Program

## 2020-01-03 VITALS — BP 114/74 | HR 87 | Ht 67.0 in | Wt 180.0 lb

## 2020-01-03 DIAGNOSIS — G4700 Insomnia, unspecified: Secondary | ICD-10-CM | POA: Insufficient documentation

## 2020-01-03 DIAGNOSIS — G4709 Other insomnia: Secondary | ICD-10-CM | POA: Diagnosis not present

## 2020-01-03 DIAGNOSIS — Z1231 Encounter for screening mammogram for malignant neoplasm of breast: Secondary | ICD-10-CM

## 2020-01-03 DIAGNOSIS — F419 Anxiety disorder, unspecified: Secondary | ICD-10-CM

## 2020-01-03 DIAGNOSIS — Z13820 Encounter for screening for osteoporosis: Secondary | ICD-10-CM

## 2020-01-03 DIAGNOSIS — I6523 Occlusion and stenosis of bilateral carotid arteries: Secondary | ICD-10-CM

## 2020-01-03 DIAGNOSIS — K219 Gastro-esophageal reflux disease without esophagitis: Secondary | ICD-10-CM

## 2020-01-03 DIAGNOSIS — Z1211 Encounter for screening for malignant neoplasm of colon: Secondary | ICD-10-CM

## 2020-01-03 DIAGNOSIS — Z0000 Encounter for general adult medical examination without abnormal findings: Secondary | ICD-10-CM | POA: Diagnosis not present

## 2020-01-03 DIAGNOSIS — S72009S Fracture of unspecified part of neck of unspecified femur, sequela: Secondary | ICD-10-CM

## 2020-01-03 DIAGNOSIS — Z87310 Personal history of (healed) osteoporosis fracture: Secondary | ICD-10-CM | POA: Diagnosis not present

## 2020-01-03 NOTE — Progress Notes (Signed)
   SUBJECTIVE:   CHIEF COMPLAINT / HPI: medication f/u  Insomnia- 2 years with sleep difficulties. Started on trazodone 1 month ago with modest improvement. Takes 1 tablet per night. Gets 3-4 hours and then wakes up and difficulty with getting back to sleep. doxepin used to work but stopped taking. Sometimes has difficulty falling asleep but can usually fall asleep quickly if very tired. Still wakes up after a few hours. Endorses some anxiety interfering with falling asleep. Watching TV in bed.  Depression/anxiety-Taking sertraline 50mg  daily and stable. Feels easily irritated. Possibly open to counseling, has not tried before.  Colonoscopy- had done once. Told to come back in 5 years. About 12 years ago.  Mammogram- last normal 2015. dexa scan- never done before. H/o hip/pelvis fracture.  Polypharmacy. Reviewed medications and tried to identify medications to discontinue.  alirocumab- not taking due to expense. protonix- 40mg  BID, reduce to daily.    OBJECTIVE:   BP 114/74   Pulse 87   Ht 5\' 7"  (1.702 m)   Wt 180 lb (81.6 kg)   SpO2 95%   BMI 28.19 kg/m   General: NAD, pleasant, able to participate in exam Neck: neg thyromegaly or cervical lymphadenopathy Cardiac: RRR, normal heart sounds, no murmurs. 2+ radial and PT pulses bilaterally Respiratory: CTAB, normal effort, No wheezes, rales or rhonchi Abdomen: soft, nontender, nondistended, no hepatic or splenomegaly, +BS Extremities: no edema. WWP. Skin: warm and dry, no rashes noted Neuro: alert and oriented, no focal deficits Psych: Normal affect and mood  ASSESSMENT/PLAN:   Insomnia Multifactorial - continue trazodone nightly - recommended patient take her higher dose of gabapentin (600mg ) at bedtime instead of in the morning - discussed good sleep hygiene habits and recommended to discontinue screen time a couple hours before bed. - recommended CBT and provided patient with [email or web address] resource to find a  Social worker. She will think about it.   Anxiety Chronic/stable Continue sertraline 50mg  daily, trazodone nightly - recommended CBT and provided with resources  Healthcare maintenance Ordered colonoscopy, mammogram, and dexa scan which are all past due. Discussed indications with patient and provided with resources for locations to complete these.   Gastroesophageal reflux disease Patient taking protonix 40mg  BID for several years.  Discussed long-term side effects of chronic PPI use.  Patient has severe GERD symptoms without treatment.  She is willing to trial once daily dosing to see if symptoms are adequately controlled.  Goal to reduce dose or change to H2 blocker if can tolerate deescalation especially given fractures and polypharmacy     [place]

## 2020-01-03 NOTE — Patient Instructions (Addendum)
It was a pleasure to see you today!  To summarize our discussion for this visit:  [email or web address] for CBT (cognitive behavioral therapy). Consider this for help with pain management, sleep, and anxiety  I would recommend cutting off all screen time at least a couple hours before bedtime.   Please complete your colonoscopy, mammogram, and bone scan (dexa).   See me again in 6 months or sooner if you need anything  We are trying to decrease your protonix to see if your symptoms can be controlled with less medication to decrease possible side effects of the medication  Some additional health maintenance measures we should update are: Health Maintenance Due  Topic Date Due  . COLONOSCOPY  Never done  . MAMMOGRAM  07/01/2015  . DEXA SCAN  Never done  . PNA vac Low Risk Adult (2 of 2 - PPSV23) 07/31/2018  .    Please return to our clinic to see 6 months.  Call the clinic at [phone] if your symptoms worsen or you have any concerns.   Thank you for allowing me to take part in your care,  Dr. [name]

## 2020-01-03 NOTE — Assessment & Plan Note (Signed)
Multifactorial - continue trazodone nightly - recommended patient take her higher dose of gabapentin (600mg ) at bedtime instead of in the morning - discussed good sleep hygiene habits and recommended to discontinue screen time a couple hours before bed. - recommended CBT and provided patient with [email or web address] resource to find a Social worker. She will think about it.

## 2020-01-03 NOTE — Assessment & Plan Note (Addendum)
Chronic/stable Continue sertraline 50mg  daily, trazodone nightly - recommended CBT and provided with resources

## 2020-01-03 NOTE — Assessment & Plan Note (Signed)
Ordered colonoscopy, mammogram, and dexa scan which are all past due. Discussed indications with patient and provided with resources for locations to complete these.

## 2020-01-03 NOTE — Assessment & Plan Note (Signed)
Patient taking protonix 40mg  BID for several years.  Discussed long-term side effects of chronic PPI use.  Patient has severe GERD symptoms without treatment.  She is willing to trial once daily dosing to see if symptoms are adequately controlled.  Goal to reduce dose or change to H2 blocker if can tolerate deescalation especially given fractures and polypharmacy

## 2020-01-04 ENCOUNTER — Other Ambulatory Visit: Payer: Self-pay | Admitting: Family Medicine

## 2020-01-04 ENCOUNTER — Other Ambulatory Visit: Payer: Self-pay | Admitting: Student in an Organized Health Care Education/Training Program

## 2020-01-04 DIAGNOSIS — Z13820 Encounter for screening for osteoporosis: Secondary | ICD-10-CM

## 2020-01-04 DIAGNOSIS — S72009S Fracture of unspecified part of neck of unspecified femur, sequela: Secondary | ICD-10-CM

## 2020-01-04 DIAGNOSIS — Z87310 Personal history of (healed) osteoporosis fracture: Secondary | ICD-10-CM

## 2020-01-09 ENCOUNTER — Other Ambulatory Visit: Payer: Self-pay | Admitting: Student in an Organized Health Care Education/Training Program

## 2020-01-10 ENCOUNTER — Other Ambulatory Visit: Payer: Self-pay

## 2020-01-10 ENCOUNTER — Ambulatory Visit (HOSPITAL_COMMUNITY)
Admission: RE | Admit: 2020-01-10 | Discharge: 2020-01-10 | Disposition: A | Discharge status: Home / Self Care | Payer: Medicare Other | Source: Ambulatory Visit | Attending: Cardiovascular Disease | Admitting: Cardiovascular Disease

## 2020-01-10 DIAGNOSIS — I6523 Occlusion and stenosis of bilateral carotid arteries: Secondary | ICD-10-CM

## 2020-01-12 ENCOUNTER — Other Ambulatory Visit: Payer: Self-pay | Admitting: Student in an Organized Health Care Education/Training Program

## 2020-01-24 ENCOUNTER — Encounter: Payer: Medicare Other | Attending: Physical Medicine & Rehabilitation | Admitting: Physical Medicine & Rehabilitation

## 2020-01-24 ENCOUNTER — Other Ambulatory Visit: Payer: Self-pay

## 2020-01-24 ENCOUNTER — Ambulatory Visit (INDEPENDENT_AMBULATORY_CARE_PROVIDER_SITE_OTHER): Payer: Medicare Other | Admitting: Pulmonary Disease

## 2020-01-24 ENCOUNTER — Encounter: Payer: Self-pay | Admitting: Physical Medicine & Rehabilitation

## 2020-01-24 ENCOUNTER — Encounter: Payer: Self-pay | Admitting: Pulmonary Disease

## 2020-01-24 VITALS — BP 158/66 | HR 99 | Temp 98.2°F | Ht 67.0 in | Wt 184.0 lb

## 2020-01-24 VITALS — BP 163/73 | HR 95 | Temp 98.4°F | Ht 67.0 in | Wt 183.6 lb

## 2020-01-24 DIAGNOSIS — R0602 Shortness of breath: Secondary | ICD-10-CM

## 2020-01-24 DIAGNOSIS — M533 Sacrococcygeal disorders, not elsewhere classified: Secondary | ICD-10-CM | POA: Insufficient documentation

## 2020-01-24 DIAGNOSIS — R059 Cough, unspecified: Secondary | ICD-10-CM | POA: Diagnosis not present

## 2020-01-24 DIAGNOSIS — M48061 Spinal stenosis, lumbar region without neurogenic claudication: Secondary | ICD-10-CM | POA: Insufficient documentation

## 2020-01-24 DIAGNOSIS — I6523 Occlusion and stenosis of bilateral carotid arteries: Secondary | ICD-10-CM | POA: Diagnosis not present

## 2020-01-24 DIAGNOSIS — J301 Allergic rhinitis due to pollen: Secondary | ICD-10-CM | POA: Diagnosis not present

## 2020-01-24 MED ORDER — BREO ELLIPTA 200-25 MCG/INH IN AEPB: 1.0000 | INHALATION_SPRAY | Freq: Every day | RESPIRATORY_TRACT | 0 refills | Status: DC

## 2020-01-24 MED ORDER — BREO ELLIPTA 200-25 MCG/INH IN AEPB: 1.0000 | INHALATION_SPRAY | Freq: Every day | RESPIRATORY_TRACT | 11 refills | Status: DC

## 2020-01-24 MED ORDER — HYDROCODONE-ACETAMINOPHEN 5-325 MG PO TABS: 1.0000 | ORAL_TABLET | Freq: Two times a day (BID) | ORAL | 0 refills | Status: DC | PRN

## 2020-01-24 MED ORDER — ALBUTEROL SULFATE HFA 108 (90 BASE) MCG/ACT IN AERS: 2.0000 | INHALATION_SPRAY | Freq: Four times a day (QID) | RESPIRATORY_TRACT | 6 refills | Status: AC | PRN

## 2020-01-24 NOTE — Patient Instructions (Signed)
Sacroiliac Joint Dysfunction  Sacroiliac joint dysfunction is a condition that causes inflammation on one or both sides of the sacroiliac (SI) joint. The SI joint connects the lower part of the spine (sacrum) with the two upper portions of the pelvis (ilium). This condition causes deep aching or burning pain in the low back. In some cases, the pain may also spread into one or both buttocks, hips, or thighs. What are the causes? This condition may be caused by:  Pregnancy. During pregnancy, extra stress is put on the SI joints because the pelvis widens.  Injury, such as: ? Injuries from car accidents. ? Sports-related injuries. ? Work-related injuries.  Having one leg that is shorter than the other.  Conditions that affect the joints, such as: ? Rheumatoid arthritis. ? Gout. ? Psoriatic arthritis. ? Joint infection (septic arthritis). Sometimes, the cause of SI joint dysfunction is not known. What are the signs or symptoms? Symptoms of this condition include:  Aching or burning pain in the lower back. The pain may also spread to other areas, such as: ? Buttocks. ? Groin. ? Thighs.  Muscle spasms in or around the painful areas.  Increased pain when standing, walking, running, stair climbing, bending, or lifting. How is this diagnosed? This condition is diagnosed with a physical exam and medical history. During the exam, the health care provider may move one or both of your legs to different positions to check for pain. Various tests may be done to confirm the diagnosis, including:  Imaging tests to look for other causes of pain. These may include: ? MRI. ? CT scan. ? Bone scan.  Diagnostic injection. A numbing medicine is injected into the SI joint using a needle. If your pain is temporarily improved or stopped after the injection, this can indicate that SI joint dysfunction is the problem. How is this treated? Treatment depends on the cause and severity of your condition.  Treatment options may include:  Ice or heat applied to the lower back area after an injury. This may help reduce pain and muscle spasms.  Medicines to relieve pain or inflammation or to relax the muscles.  Wearing a back brace (sacroiliac brace) to help support the joint while your back is healing.  Physical therapy to increase muscle strength around the joint and flexibility at the joint. This may also involve learning proper body positions and ways of moving to relieve stress on the joint.  Direct manipulation of the SI joint.  Injections of steroid medicine into the joint to reduce pain and swelling.  Radiofrequency ablation to burn away nerves that are carrying pain messages from the joint.  Use of a device that provides electrical stimulation to help reduce pain at the joint.  Surgery to put in screws and plates that limit or prevent joint motion. This is rare. Follow these instructions at home: Medicines  Take over-the-counter and prescription medicines only as told by your health care provider.  Do not drive or use heavy machinery while taking prescription pain medicine.  If you are taking prescription pain medicine, take actions to prevent or treat constipation. Your health care provider may recommend that you: ? Drink enough fluid to keep your urine pale yellow. ? Eat foods that are high in fiber, such as fresh fruits and vegetables, whole grains, and beans. ? Limit foods that are high in fat and processed sugars, such as fried or sweet foods. ? Take an over-the-counter or prescription medicine for constipation. If you have a brace:    Wear the brace as told by your health care provider. Remove it only as told by your health care provider.  Keep the brace clean.  If the brace is not waterproof: ? Do not let it get wet. ? Cover it with a watertight covering when you take a bath or a shower. Managing pain, stiffness, and swelling      Icing can help with pain and  swelling. Heat may help with muscle tension or spasms. Ask your health care provider if you should use ice or heat.  If directed, put ice on the affected area: ? If you have a removable brace, remove it as told by your health care provider. ? Put ice in a plastic bag. ? Place a towel between your skin and the bag. ? Leave the ice on for 20 minutes, 2-3 times a day.  If directed, apply heat to the affected area. Use the heat source that your health care provider recommends, such as a moist heat pack or a heating pad. ? Place a towel between your skin and the heat source. ? Leave the heat on for 20-30 minutes. ? Remove the heat if your skin turns bright red. This is especially important if you are unable to feel pain, heat, or cold. You may have a greater risk of getting burned. General instructions  Rest as needed. Ask your health care provider what activities are safe for you.  Return to your normal activities as told by your health care provider.  Exercise as directed by your health care provider or physical therapist.  Do not use any products that contain nicotine or tobacco, such as cigarettes and e-cigarettes. These can delay bone healing. If you need help quitting, ask your health care provider.  Keep all follow-up visits as told by your health care provider. This is important. Contact a health care provider if:  Your pain is not controlled with medicine.  You have a fever.  Your pain is getting worse. Get help right away if:  You have weakness, numbness, or tingling in your legs or feet.  You lose control of your bladder or bowel. Summary  Sacroiliac joint dysfunction is a condition that causes inflammation on one or both sides of the sacroiliac (SI) joint.  This condition causes deep aching or burning pain in the low back. In some cases, the pain may also spread into one or both buttocks, hips, or thighs.  Treatment depends on the cause and severity of your condition.  It may include medicines to reduce pain and swelling or to relax muscles. This information is not intended to replace advice given to you by your health care provider. Make sure you discuss any questions you have with your health care provider. Document Revised: 10/21/2017 Document Reviewed: 04/06/2017 Elsevier Patient Education  2020 [employer]  

## 2020-01-24 NOTE — Progress Notes (Signed)
Subjective:    Patient ID: [name], female    DOB: 1950-03-30, 69 y.o.   MRN: [id number] 69 y.o. female with history of normocytic anemia, TIAs, HTN, cervical neuritis, migraines a fall 2 weeks 5 admission and pelvic fractures and poor pain control.  She was admitted on 03/06 at 22 with inability to ambulate and difficulty being managed at home.  CT of head was negative for acute changes.  CT pelvic and lumbar spine done for work-up and showed chronic compression fracture of L1 with less than 50% deformity and minimally displaced fracture of left pubis, left acetabular and left sacrum with extension to left SI joint.  Ortho was consulted for input and recommended TTWB RLE.  Gabapentin was added and titrated upwards for pain management.  IV Dilaudid was weaned off and multiple narcotics were added and titrated for better pain control.  Therapy was initiated and patient was noted to have functional deficits and mobility and ADLs.  CIR was recommended for follow-up therapy   Admit date: 05/23/2019 Discharge date: 05/29/2019 HPI Patient is now ambulating without a cane or walker.  She does have a slowed gait with a widened base of support. She continues to require pain medication but is only taking hydrocodone 5 mg twice daily.  Reviewed PDMP no other prescribers Patient has pain on the right side right and left side of the low back. We reviewed response to sacroiliac injection.  She had greater than 50% relief with right sacroiliac injection performed 11/01/2019.  She also had 50% relief with bilateral sacroiliac injection performed on 11/25/2019.   Pain Inventory Average Pain 8 Pain Right Now 9 My pain is constant, sharp, burning, stabbing and aching  In the last 24 hours, has pain interfered with the following? General activity 9 Relation with others 9 Enjoyment of life 9 What TIME of day is your pain at its worst? morning , daytime, evening and night Sleep (in general) Poor  Pain is  worse with: walking, sitting and standing Pain improves with: rest and medication Relief from Meds: 7  Family History  Problem Relation Age of Onset  . Cancer Mother 37       breast  . Alzheimer's disease Mother   . Prostate cancer Father   . Cancer Brother   . Hyperlipidemia Brother   . Lung cancer Father   . Dementia Maternal Grandmother   . Cancer Maternal Grandfather   . Cancer Brother   . Hyperlipidemia Brother   . Dementia Brother        frontotemporal dementia   Social History   Socioeconomic History  . Marital status: Married    Spouse name: [name]  . Number of children: 1  . Years of education: 12+  . Highest education level: High school graduate  Occupational History  . Occupation: ACTIVITY DIRECTOR    Employer: [employer]  . Occupation: CNA  Tobacco Use  . Smoking status: Former Smoker    Packs/day: 0.30    Types: Cigarettes    Quit date: 11/01/2011    Years since quitting: 8.2  . Smokeless tobacco: Never Used  Vaping Use  . Vaping Use: Never used  Substance and Sexual Activity  . Alcohol use: No    Alcohol/week: 0.0 standard drinks  . Drug use: No  . Sexual activity: Yes    Partners: Male    Birth control/protection: Surgical    Comment: 1 sexual partner in last 12 months: married  Other Topics Concern  .  Not on file  Social History Narrative   ** Merged History Encounter **       Patient is married ([name]). Patient has one child and grandchild. Her sons name is Shanon Brow. Patient sees them often. 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 Patient spends 6-10 hours with her 6 days a week.     Patient has a college education. Patient is very active in her church.  Hobbies- outside crafts and church choir.   Social Determinants of Health   Financial Resource Strain:   . Difficulty of Paying Living Expenses: Not on file  Food Insecurity:   . Worried About Charity fundraiser in the Last Year: Not  on file  . Ran Out of Food in the Last Year: Not on file  Transportation Needs:   . Lack of Transportation (Medical): Not on file  . Lack of Transportation (Non-Medical): Not on file  Physical Activity:   . Days of Exercise per Week: Not on file  . Minutes of Exercise per Session: Not on file  Stress:   . Feeling of Stress : Not on file  Social Connections:   . Frequency of Communication with Friends and Family: Not on file  . Frequency of Social Gatherings with Friends and Family: Not on file  . Attends Religious Services: Not on file  . Active Member of Clubs or Organizations: Not on file  . Attends Archivist Meetings: Not on file  . Marital Status: Not on file   Past Surgical History:  Procedure Laterality Date  . ABDOMINAL HYSTERECTOMY    . BILIARY STENT PLACEMENT N/A 12/12/2015   Procedure: BILIARY STENT PLACEMENT;  Surgeon: Doran Stabler, MD;  Location: [facility];  Service: Endoscopy;  Laterality: N/A;  . BLADDER REPAIR    . CARPAL TUNNEL RELEASE    . CERVICAL FUSION    . CERVICAL SPINE SURGERY    . CHOLECYSTECTOMY N/A 12/06/2015   Procedure: LAPAROSCOPIC CHOLECYSTECTOMY WITH  INTRAOPERATIVE CHOLANGIOGRAM;  Surgeon: Stark Klein, MD;  Location: [facility];  Service: General;  Laterality: N/A;  . ELBOW SURGERY    . ERCP N/A 12/12/2015   Procedure: ENDOSCOPIC RETROGRADE CHOLANGIOPANCREATOGRAPHY (ERCP);  Surgeon: Doran Stabler, MD;  Location: [facility]COPY;  Service: Endoscopy;  Laterality: N/A;  . ESOPHAGOGASTRODUODENOSCOPY N/A 01/28/2016   Procedure: ESOPHAGOGASTRODUODENOSCOPY (EGD) Biliary STENT removal;  Surgeon: Doran Stabler, MD;  Location: [facility];  Service: Gastroenterology;  Laterality: N/A;  . IR GENERIC HISTORICAL  12/17/2015   IR US GUIDE BX ASP/DRAIN 12/17/2015 [facility]  . IR GENERIC HISTORICAL  12/17/2015   IR GUIDED DRAIN W CATHETER PLACEMENT 12/17/2015 [facility]  . IR GENERIC HISTORICAL  12/17/2015   IR SINUS/FIST TUBE CHK-NON GI  12/17/2015 [facility]  . KNEE SURGERY    . LUNG SURGERY    . TEE WITHOUT CARDIOVERSION N/A 12/19/2015   Procedure: TRANSESOPHAGEAL ECHOCARDIOGRAM (TEE);  Surgeon: Josue Hector, MD;  Location[facility] END[facility]COPY;  Service: Cardiovascular;  Laterality: N/A;   Past Surgical History:  Procedure Laterality Date  . ABDOMINAL HYSTERECTOMY    . BILIARY STENT PLACEMENT N/A 12/12/2015   Procedure: BILIARY STENT PLACEMENT;  Surgeon: Doran Stabler, MD;  Location: [facility];  Service: Endoscopy;  Laterality: N/A;  . BLADDER REPAIR    . CARPAL TUNNEL RELEASE    . CERVICAL FUSION    . CERVICAL SPINE SURGERY    . CHOLECYSTECTOMY N/A  12/06/2015   Procedure: LAPAROSCOPIC CHOLECYSTECTOMY WITH  INTRAOPERATIVE CHOLANGIOGRAM;  Surgeon: Stark Klein, MD;  Location: [facility];  Service: General;  Laterality: N/A;  . ELBOW SURGERY    . ERCP N/A 12/12/2015   Procedure: ENDOSCOPIC RETROGRADE CHOLANGIOPANCREATOGRAPHY (ERCP);  Surgeon: Doran Stabler, MD;  Location: [facility] ENDOSCOPY;  Service: Endoscopy;  Laterality: N/A;  . ESOPHAGOGASTRODUODENOSCOPY N/A 01/28/2016   Procedure: ESOPHAGOGASTRODUODENOSCOPY (EGD) Biliary STENT removal;  Surgeon: Doran Stabler, MD;  Location: [facility];  Service: Gastroenterology;  Laterality: N/A;  . IR GENERIC HISTORICAL  12/17/2015   IR US GUIDE BX ASP/DRAIN 12/17/2015 [facility]  . IR GENERIC HISTORICAL  12/17/2015   IR GUIDED DRAIN W CATHETER PLACEMENT 12/17/2015 [facility]  . IR GENERIC HISTORICAL  12/17/2015   IR SINUS/FIST TUBE CHK-NON GI 12/17/2015 [facility]  . KNEE SURGERY    . LUNG SURGERY    . TEE WITHOUT CARDIOVERSION N/A 12/19/2015   Procedure: TRANSESOPHAGEAL ECHOCARDIOGRAM (TEE);  Surgeon: Josue Hector, MD;  Location: [facility] ENDOSCOPY;  Service: Cardiovascular;  Laterality: N/A;   Past Medical History:  Diagnosis Date  . Anemia   . Cervical neuritis   . Chronic kidney disease    stage 3  . Dyslipidemia   . GERD (gastroesophageal reflux disease)   .  History of transient ischemic attack (TIA)   . HTN (hypertension)   . Hyperlipidemia   . Migraine   . Migraine headache   . Occipital neuralgia    Left  . Pulmonary embolism ([place]) 2017  . Renal insufficiency   . Stroke ([place]) 10/2013   There were no vitals taken for this visit.  Opioid Risk Score:   Fall Risk Score:  `1  Depression screen PHQ 2/9  Depression screen [facility] 2/9 01/03/2020 12/23/2019 11/25/2019 09/02/2019 08/22/2019 08/05/2019 08/02/2019  Decreased Interest 1 1 1 3  0 1 0  Down, Depressed, Hopeless 1 1 1 3  0 1 0  PHQ - 2 Score 2 2 2 6  0 2 0  Altered sleeping 1 - - - - - -  Tired, decreased energy 1 - - - - - -  Change in appetite 1 - - - - - -  Feeling bad or failure about yourself  1 - - - - - -  Trouble concentrating 1 - - - - - -  Moving slowly or fidgety/restless 1 - - - - - -  Suicidal thoughts 0 - - - - - -  PHQ-9 Score 8 - - - - - -  Difficult doing work/chores Not difficult at all - - - - - -  Some recent data might be hidden   Review of Systems  Musculoskeletal: Positive for back pain and gait problem.  All other systems reviewed and are negative.      Objective:   Physical Exam Vitals and nursing note reviewed.  Constitutional:      Appearance: She is normal weight.  HENT:     Head: Normocephalic and atraumatic.  Eyes:     Extraocular Movements: Extraocular movements intact.     Conjunctiva/sclera: Conjunctivae normal.     Pupils: Pupils are equal, round, and reactive to light.  Musculoskeletal:        General: Tenderness present.     Comments: Is decreased lumbarextension lateral bending and rotation approximately 50% Lumbar flexion is normal Tenderness over the right greater than left PSIS area  Skin:    General: Skin is warm and dry.  Neurological:  General: No focal deficit present.     Mental Status: She is alert and oriented to person, place, and time.     Sensory: Sensation is intact.     Motor: No weakness.     Comments: Patient  has 5/5 strength bilateral hip flexor knee extensor ankle dorsiflexor. Gait is without evidence of toe drag or knee instability she has wide-based support decreased step length.  Psychiatric:        Mood and Affect: Mood normal.        Behavior: Behavior normal.           Assessment & Plan:  #1.  Bilateral sacroiliac disorder responded short-term to sacroiliac injection under fluoroscopic guidance.  She is still on narcotic analgesics although reducing the dose.  We discussed next step in terms of a longer lasting pain procedure would be performing sacroiliac nerve block on the right side, specifically this would be right L5 dorsal ramus right S1 is to S3 lateral branch block under fluoroscopic guidance.  If patient gets at least 50% relief with this procedure would proceed to a radiofrequency procedure of the same nerves.  If there is a good response on the right side then would proceed with the left side as well.

## 2020-01-24 NOTE — Patient Instructions (Addendum)
I worry your symptoms could be related to chronic bronchitis or asthma.   Use Breo 1 puff once daily and albuterol 2 puffs AS NEEDED for shortness of breath. These medicines will hopefull reduce inflammation and open the airways to make it easier for you to breathe and improve your cough.   Return to clinic in 3 months with Dr Silas Flood or sooner if needed

## 2020-01-25 NOTE — Progress Notes (Signed)
@Patient  ID: [name], female    DOB: Feb 12, 1951, 69 y.o.   MRN: [id number]  Chief Complaint  Patient presents with  . Consult    SOB, wheezing and cough    Referring provider: Skeet Latch, MD  HPI:   69 year old woman whom are seen in consultation at request of Skeet Latch MD for evaluation of dyspnea on exertion.  Notes from referring provider and primary care provider reviewed.  No significant dyspnea exertion for the last many years but seems worse over the last year or so.  She has significant work-up in the past with multiple echocardiograms all look normal on my review.  She been offered pulmonary consultation in the past but declined but recently reevaluated and  accepted this.  She has significant dyspnea on exertion.  With inclines over the last several months.  Present with flat surfaces well.  Rest relieves the symptoms.  No chest pain.  She states onset of cough over the last year or so as well.  Nonproductive.  No alleviating or exacerbating factors.  No timing throughout the day.  Not associated with eating or drinking.  Has not really take any medication for either of these symptoms.  She does report significant nasal allergies that come with seasons.  Uses an inhaled nasal steroid which has helped with sensation of postnasal drip that she is had in the past.  Endorses history of GERD for which he takes PPI with relatively well-controlled symptoms with some breakthrough symptoms.  Review of imaging including CT chest PE protocol 07/2019 reveals clear lungs, scattered mosaicism, rounded atelectasis left lower base on my interpretation.  PMH: Prior tobacco abuse, hypertension, chronic pain, Surgical history: Hysterectomy, cholecystectomy, cervical spine surgery, knee surgery Family history: Mother with breast cancer, father with prostate cancer Social history: Former smoker, about 20-pack-year smoking history quit many years ago in 2013 Questionaires /  Pulmonary Flowsheets:   ACT:  No flowsheet data found.  MMRC: No flowsheet data found.  Epworth:  No flowsheet data found.  Tests:   FENO:  No results found for: NITRICOXIDE  PFT: PFT Results Latest Ref Rng & Units 12/26/2019  FVC-Pre L 2.27  FVC-Predicted Pre % 68  FVC-Post L 2.37  FVC-Predicted Post % 71  Pre FEV1/FVC % % 82  Post FEV1/FCV % % 84  FEV1-Pre L 1.86  FEV1-Predicted Pre % 73  FEV1-Post L 1.99  DLCO uncorrected ml/min/mmHg 11.39  DLCO UNC% % 54  DLVA Predicted % 88  TLC L 3.98  TLC % Predicted % 74  RV % Predicted % 72  Personally reviewed and interpreted as no evidence of fixed obstruction, spinal suggestive of moderate restriction.  Total lung capacity consistent with moderate restriction of 74% predicted, no evidence of gas trapping, DLCO moderately reduced but likely underestimated given inability to achieve 90% of FVC on maneuver  WALK:  No flowsheet data found.  Imaging:  Personally reviewed and as per discussion this note in EMR VAS US CAROTID  Result Date: 01/10/2020 Carotid Arterial Duplex Study Indications:       Carotid artery disease. Risk Factors:      Hypertension, hyperlipidemia, past history of smoking. Other Factors:     H/o TIA                     Patient denies any cerebrovascular symptoms. Comparison Study:  In 09/2017, a carotid duplex showed a RICA velocity of 61/22  cm/s and a LICA velocity of 91/47 cm/s Performing Technologist: Wilkie Aye RVT  Examination Guidelines: A complete evaluation includes B-mode imaging, spectral Doppler, color Doppler, and power Doppler as needed of all accessible portions of each vessel. Bilateral testing is considered an integral part of a complete examination. Limited examinations for reoccurring indications may be performed as noted.  Right Carotid Findings: +----------+--------+--------+--------+--------------------------+--------+           PSV cm/sEDV cm/sStenosisPlaque  Description        Comments +----------+--------+--------+--------+--------------------------+--------+ CCA Prox  109     24                                                 +----------+--------+--------+--------+--------------------------+--------+ CCA Distal88      27                                                 +----------+--------+--------+--------+--------------------------+--------+ ICA Prox  62      26      1-39%   heterogenous and irregular         +----------+--------+--------+--------+--------------------------+--------+ ICA Mid   94      40                                                 +----------+--------+--------+--------+--------------------------+--------+ ICA Distal60      22                                                 +----------+--------+--------+--------+--------------------------+--------+ ECA       186     42      >50%    heterogenous                       +----------+--------+--------+--------+--------------------------+--------+ +----------+--------+-------+----------------+-------------------+           PSV cm/sEDV cmsDescribe        Arm Pressure (mmHG) +----------+--------+-------+----------------+-------------------+ [id number]            Multiphasic, [id number]                 +----------+--------+-------+----------------+-------------------+ +---------+--------+--+--------+--+---------+ VertebralPSV cm/s44EDV cm/s16Antegrade +---------+--------+--+--------+--+---------+  Left Carotid Findings: +----------+--------+--------+--------+--------------------------+--------+           PSV cm/sEDV cm/sStenosisPlaque Description        Comments +----------+--------+--------+--------+--------------------------+--------+ CCA Prox  84      25                                                 +----------+--------+--------+--------+--------------------------+--------+ CCA Distal75      23                                                  +----------+--------+--------+--------+--------------------------+--------+  ICA Prox  58      22      1-39%   heterogenous and irregular         +----------+--------+--------+--------+--------------------------+--------+ ICA Mid   96      34                                                 +----------+--------+--------+--------+--------------------------+--------+ ICA Distal85      29                                                 +----------+--------+--------+--------+--------------------------+--------+ ECA       158     39      >50%    heterogenous                       +----------+--------+--------+--------+--------------------------+--------+ +----------+--------+--------+----------------+-------------------+           PSV cm/sEDV cm/sDescribe        Arm Pressure (mmHG) +----------+--------+--------+----------------+-------------------+ [id number]             Multiphasic, [id number]                 +----------+--------+--------+----------------+-------------------+ +---------+--------+--+--------+--+---------+ VertebralPSV cm/s52EDV cm/s17Antegrade +---------+--------+--+--------+--+---------+   Summary: Right Carotid: Velocities in the right ICA are consistent with a 1-39% stenosis.                Non-hemodynamically significant plaque <50% noted in the CCA. The                ECA appears >50% stenosed. Left Carotid: Velocities in the left ICA are consistent with a 1-39% stenosis.               Non-hemodynamically significant plaque <50% noted in the CCA. The               ECA appears >50% stenosed. Vertebrals:  Bilateral vertebral arteries demonstrate antegrade flow. Subclavians: Normal flow hemodynamics were seen in bilateral subclavian              arteries. *See table(s) above for measurements and observations.  Electronically signed by Jenkins Rouge MD on 01/10/2020 at 5:27:19 PM.    Final     Lab Results:  CBC    Component Value  Date/Time   WBC 6.8 08/29/2019 1030   RBC 5.18 (H) 08/29/2019 1030   HGB 13.4 08/29/2019 1030   HGB 12.2 01/06/2019 1443   HCT 42.6 08/29/2019 1030   HCT 36.3 01/06/2019 1443   PLT 224 08/29/2019 1030   PLT 208 01/06/2019 1443   MCV 82.2 08/29/2019 1030   MCV 86 01/06/2019 1443   MCH 25.9 (L) 08/29/2019 1030   MCHC 31.5 08/29/2019 1030   RDW 14.9 08/29/2019 1030   RDW 13.7 01/06/2019 1443   LYMPHSABS 0.7 08/29/2019 1030   LYMPHSABS 1.1 01/06/2019 1443   MONOABS 0.3 08/29/2019 1030   EOSABS 0.1 08/29/2019 1030   EOSABS 0.1 01/06/2019 1443   BASOSABS 0.0 08/29/2019 1030   BASOSABS 0.1 01/06/2019 1443    BMET    Component Value Date/Time   NA 139 08/29/2019 1030   NA 144 08/25/2019 1351   K 3.2 (L) 08/29/2019 1030   CL 108 08/29/2019 1030  CO2 21 (L) 08/29/2019 1030   GLUCOSE 135 (H) 08/29/2019 1030   BUN 8 08/29/2019 1030   BUN 16 08/25/2019 1351   CREATININE 0.89 08/29/2019 1030   CREATININE 1.10 (H) 02/25/2016 0907   CALCIUM 9.4 08/29/2019 1030   GFRNONAA >60 08/29/2019 1030   GFRNONAA 53 (L) 02/25/2016 0907   GFRAA >60 08/29/2019 1030   GFRAA 61 02/25/2016 0907    BNP    Component Value Date/Time   BNP 66.3 07/31/2019 1556    ProBNP No results found for: PROBNP  Specialty Problems      Pulmonary Problems   Dyspnea   Chronic cough      Allergies  Allergen Reactions  . Shellfish Allergy Anaphylaxis    All seafood  . Statins Other (See Comments)    Arthralgias (severe) with atorvastatin, mild arthralgias with rosuvastatin but willing to continue taking it    Immunization History  Administered Date(s) Administered  . Influenza,inj,Quad PF,6+ Mos 01/25/2016, 01/02/2017  . Influenza-Unspecified 03/25/2016  . Moderna SARS-COVID-2 Vaccination 08/19/2019, 09/16/2019  . Pneumococcal Conjugate-13 07/30/2017  . Tdap 01/25/2016    Past Medical History:  Diagnosis Date  . Anemia   . Cervical neuritis   . Chronic kidney disease    stage 3  .  Dyslipidemia   . GERD (gastroesophageal reflux disease)   . History of transient ischemic attack (TIA)   . HTN (hypertension)   . Hyperlipidemia   . Migraine   . Migraine headache   . Occipital neuralgia    Left  . Pulmonary embolism ([place]) 2017  . Renal insufficiency   . Stroke [facility]) 10/2013    Tobacco History: Social History   Tobacco Use  Smoking Status Former Smoker  . Packs/day: 0.30  . Types: Cigarettes  . Quit date: 11/01/2011  . Years since quitting: 8.2  Smokeless Tobacco Never Used   Counseling given: Not Answered   Continue to not smoke  Outpatient Encounter Medications as of 01/24/2020  Medication Sig  . calcium carbonate (OS-CAL - DOSED IN MG OF ELEMENTAL CALCIUM) 1250 (500 Ca) MG tablet Take 1 tablet (500 mg of elemental calcium total) by mouth daily with breakfast.  . cetirizine (ZYRTEC) 10 MG tablet Take 10 mg by mouth daily with breakfast.  . clopidogrel (PLAVIX) 75 MG tablet TAKE 1 TABLET(75 MG) BY MOUTH DAILY  . doxepin (SINEQUAN) 100 MG capsule Take by mouth.  . ELDERBERRY PO Take 1 tablet by mouth daily with breakfast.  . fluticasone (FLONASE) 50 MCG/ACT nasal spray Place 1 spray into both nostrils daily.  . furosemide (LASIX) 40 MG tablet Take 40 mg by mouth daily.  Marland Kitchen gabapentin (NEURONTIN) 300 MG capsule TAKE 2 CAPSULES BY MOUTH TWICE DAILY( 8 AM AND 4 PM) AND 3 CAPSULES AT BEDTIME  . gabapentin (NEURONTIN) 600 MG tablet Take by mouth.  Marland Kitchen HYDROcodone-acetaminophen (NORCO/VICODIN) 5-325 MG tablet Take 1 tablet by mouth 2 (two) times daily as needed for moderate pain or severe pain.  . Multiple Vitamins-Minerals (HAIR/SKIN/NAILS/BIOTIN) TABS Take 2 tablets by mouth daily with breakfast.  . pantoprazole (PROTONIX) 40 MG tablet Take 1 tablet (40 mg total) by mouth daily with breakfast.  . propranolol (INDERAL) 10 MG tablet TAKE 1 TABLET(10 MG) BY MOUTH TWICE DAILY  . sertraline (ZOLOFT) 50 MG tablet Take 1 tablet (50 mg total) by mouth daily.  Marland Kitchen  tiZANidine (ZANAFLEX) 2 MG tablet Take 1 tablet (2 mg total) by mouth 3 (three) times daily.  . traZODone (DESYREL) 50 MG tablet TAKE  1/2 TO 1 TABLET(25 TO 50 MG) BY MOUTH AT BEDTIME AS NEEDED FOR SLEEP  . Vitamin D, Ergocalciferol, (DRISDOL) 1.25 MG (50000 UNIT) CAPS capsule Take 1 capsule (50,000 Units total) by mouth every 7 (seven) days.  Marland Kitchen zonisamide (ZONEGRAN) 100 MG capsule TAKE 1 CAPSULE(100 MG) BY MOUTH TWICE DAILY  . zonisamide (ZONEGRAN) 25 MG capsule take 25 mg twice daily prn in addition to 100 mg twice daily.  Marland Kitchen acetaminophen (TYLENOL) 325 MG tablet Take 2 tablets (650 mg total) by mouth 4 (four) times daily -  with meals and at bedtime. (Patient not taking: Reported on 01/24/2020)  . albuterol (VENTOLIN HFA) 108 (90 Base) MCG/ACT inhaler Inhale 2 puffs into the lungs every 6 (six) hours as needed for wheezing or shortness of breath.  . Alirocumab (PRALUENT) 75 MG/ML SOAJ Inject 75 mg into the skin every 14 (fourteen) days. (Patient not taking: Reported on 12/23/2019)  . amLODipine (NORVASC) 2.5 MG tablet Take 1 tablet (2.5 mg total) by mouth daily. CALL OFFICE FOR APPOINTMENT (Patient not taking: Reported on 01/24/2020)  . Baclofen 5 MG TABS Take 1 tablet by mouth 3 (three) times daily. (Patient not taking: Reported on 01/24/2020)  . diclofenac Sodium (VOLTAREN) 1 % GEL APPLY 2 GRAMS TOPICALLY TO LEFT HIP FOUR TIMES DAILY (Patient not taking: Reported on 12/23/2019)  . ezetimibe (ZETIA) 10 MG tablet Take 10 mg by mouth daily.  (Patient not taking: Reported on 01/24/2020)  . fluticasone furoate-vilanterol (BREO ELLIPTA) 200-25 MCG/INH AEPB Inhale 1 puff into the lungs daily.  . fluticasone furoate-vilanterol (BREO ELLIPTA) 200-25 MCG/INH AEPB Inhale 1 puff into the lungs daily.  . polyethylene glycol (MIRALAX / GLYCOLAX) 17 g packet Take 17 g by mouth daily as needed. (Patient not taking: Reported on 01/24/2020)  . predniSONE (DELTASONE) 10 MG tablet  (Patient not taking: Reported on  12/23/2019)  . prochlorperazine (COMPAZINE) 10 MG tablet Take 1 tablet (10 mg total) by mouth 2 (two) times daily as needed for nausea or vomiting. (Patient not taking: Reported on 12/23/2019)  . senna (SENOKOT) 8.6 MG TABS tablet Take 1 tablet (8.6 mg total) by mouth daily. (Patient not taking: Reported on 12/23/2019)   No facility-administered encounter medications on file as of 01/24/2020.     Review of Systems  Review of Systems  No chest pain.  No orthopnea or PND.  No lower extremity swelling.  Comprehensive review of systems otherwise negative.  Physical Exam  BP (!) 158/66 (BP Location: Right Arm, Cuff Size: Normal)   Pulse 99   Temp 98.2 F (36.8 C) (Oral)   Ht 5\' 7"  (1.702 m)   Wt 184 lb (83.5 kg)   SpO2 96%   BMI 28.82 kg/m   Wt Readings from Last 5 Encounters:  01/24/20 184 lb (83.5 kg)  01/24/20 183 lb 9.6 oz (83.3 kg)  01/03/20 180 lb (81.6 kg)  12/23/19 185 lb 6.4 oz (84.1 kg)  12/20/19 182 lb (82.6 kg)    BMI Readings from Last 5 Encounters:  01/24/20 28.82 kg/m  01/24/20 28.76 kg/m  01/03/20 28.19 kg/m  12/23/19 29.04 kg/m  12/20/19 28.51 kg/m     Physical Exam General: Well-appearing, frequent coughing Eyes: EOMI, no icterus Neck: Supple, no JVP appreciated History: Clear aspiration bilaterally, no wheezes or crackles Cardiovascular: Regular rhythm, no murmurs Abdomen: Nondistended, bowel sounds present MSK: No synovitis, joint effusion Neuro: Normal gait, no weakness Psych: Normal mood, full affect   Assessment & Plan:   Dyspnea exertion: Worse over the  last year or so.  Associated with reports of wheezing.  Possible defect or related to deconditioning but greatest concern for reactive airway disease given relatively abrupt onset and in association with atopic symptoms.  Recent PFTs do not demonstrate evidence of fixed obstruction.  Does show mild to moderate restriction on total lung capacity without evidence of gas trapping.  However,  recent chest CT in the summer 2021 shows no evidence of ILD, possible contribution from chest wall interference.  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  No evidence of pulmonary hypertension on prior echocardiograms to suggest this is the etiology of her mosaicism.  Will start therapy for presumed asthma with high-dose Breo once daily as well as as needed albuterol.  No history of significantly elevated eosinophils with recent value 100 03/2019.  Consider IgE and RAST testing in the future if symptoms not improving.  Cough: Suspect related to asthma being treated as above.  She also has history of postnasal drip as well as reflux so could be multifactorial.  Assess response to ICS/LABA above.   Return in about 3 months (around 04/25/2020).   Lanier Clam, MD 01/25/2020

## 2020-02-07 ENCOUNTER — Other Ambulatory Visit: Payer: Self-pay | Admitting: Cardiovascular Disease

## 2020-02-13 DIAGNOSIS — M542 Cervicalgia: Secondary | ICD-10-CM | POA: Diagnosis not present

## 2020-02-16 ENCOUNTER — Encounter: Payer: Self-pay | Admitting: Physical Medicine & Rehabilitation

## 2020-02-16 ENCOUNTER — Other Ambulatory Visit: Payer: Self-pay

## 2020-02-16 ENCOUNTER — Encounter: Payer: Medicare Other | Attending: Physical Medicine & Rehabilitation | Admitting: Physical Medicine & Rehabilitation

## 2020-02-16 VITALS — BP 164/96 | HR 77 | Temp 98.1°F | Ht 67.0 in | Wt 182.0 lb

## 2020-02-16 DIAGNOSIS — M259 Joint disorder, unspecified: Secondary | ICD-10-CM | POA: Insufficient documentation

## 2020-02-16 MED ORDER — TRAMADOL HCL 50 MG PO TABS
50.0000 mg | ORAL_TABLET | Freq: Three times a day (TID) | ORAL | 5 refills | Status: DC | PRN
Start: 2020-02-16 — End: 2020-05-25

## 2020-02-16 NOTE — Patient Instructions (Signed)
Sacroiliac nerve block performed on the right side today.  We will follow up and see the effect of this injection and consider radiofrequency neurotomy if it provides greater than 50% relief on a temporary basis

## 2020-02-16 NOTE — Progress Notes (Signed)
L5 dorsal ramus S1-S2-S3 lateral branch blocks under fluoroscopic guidance RIGHT side  Informed consent was obtained after describing risks and benefits of the procedure with patient these include bleeding bruising and infection.  He elects to proceed and has given written consent.  Patient placed prone on fluoroscopy table Betadine prep sterile drape a 25-gauge 1.5 inch needle was used to anesthetize skin and subcu tissue with 1% lidocaine 1 cc into each of 4 sites.  Then a 22-gauge 3.5" needle was inserted under fluoroscopic guidance for starting the S1 SAP sacral ala junction.  Bone contact made.  Isovue 200 x 0.5 mL demonstrated no intravascular uptake then 0.5 mL of 2% lidocaine was injected.  Then the lateral aspect of the S1, S2, S3 foramen was targeted.  Bone contact made out, Isovue-200 times 0.5 mL demonstrated no nerve root or intravascular uptake within 0.5 mL of 2% lidocaine solution was injected after negative drawback for blood.  Patient tolerated procedure well.  Postinjection instructions given.  Patient is weaning off of hydrocodone, was taking tramadol 50 mg prior to injury.  Will resume

## 2020-02-16 NOTE — Progress Notes (Signed)
  PROCEDURE RECORD [facility]   Name: [name] DOB:14-Jul-1950 MRN: [id number]  Date:02/16/2020  Physician: Alysia Penna, MD    Nurse/CMA:   Allergies:  Allergies  Allergen Reactions  . Shellfish Allergy Anaphylaxis    All seafood  . Statins Other (See Comments)    Arthralgias (severe) with atorvastatin, mild arthralgias with rosuvastatin but willing to continue taking it    Consent Signed: Yes.    Is patient diabetic? No.  CBG today?   Pregnant: No. LMP: No LMP recorded. Patient has had a hysterectomy. (age [age])  Anticoagulants: yes (Plavix, stopped 2 days agao) Anti-inflammatory: no Antibiotics: no  Procedure: right lumbar 5 dorsal  [place], S1-3 lateral branch block  Position: Prone Start Time: 11:16am  End Time: 11:25am  Fluoro Time: 51s   RN/CMA Jinna Weinman. CMA [place], CMA    Time 10:44am 11:31am    BP 164/96 191/109    Pulse 77 78    Respirations 16 16    O2 Sat 96 96    S/S 6 6    Pain Level 8/10 4/10     D/C home with husband, [name] & O X 3, D/C instructions reviewed, and sits independently.

## 2020-02-20 DIAGNOSIS — M1711 Unilateral primary osteoarthritis, right knee: Secondary | ICD-10-CM | POA: Diagnosis not present

## 2020-02-20 DIAGNOSIS — M1712 Unilateral primary osteoarthritis, left knee: Secondary | ICD-10-CM | POA: Diagnosis not present

## 2020-02-20 DIAGNOSIS — M170 Bilateral primary osteoarthritis of knee: Secondary | ICD-10-CM | POA: Diagnosis not present

## 2020-02-28 ENCOUNTER — Ambulatory Visit: Payer: Medicare Other | Admitting: Cardiovascular Disease

## 2020-02-28 DIAGNOSIS — M170 Bilateral primary osteoarthritis of knee: Secondary | ICD-10-CM | POA: Diagnosis not present

## 2020-02-29 ENCOUNTER — Other Ambulatory Visit: Payer: Self-pay | Admitting: Physical Medicine & Rehabilitation

## 2020-03-01 DIAGNOSIS — M542 Cervicalgia: Secondary | ICD-10-CM | POA: Diagnosis not present

## 2020-03-01 DIAGNOSIS — M5412 Radiculopathy, cervical region: Secondary | ICD-10-CM | POA: Diagnosis not present

## 2020-03-06 ENCOUNTER — Encounter: Payer: Self-pay | Admitting: Gastroenterology

## 2020-03-07 DIAGNOSIS — M170 Bilateral primary osteoarthritis of knee: Secondary | ICD-10-CM | POA: Diagnosis not present

## 2020-03-12 ENCOUNTER — Encounter: Payer: Medicare Other | Admitting: Cardiovascular Disease

## 2020-03-12 ENCOUNTER — Telehealth: Payer: Self-pay | Admitting: *Deleted

## 2020-03-12 NOTE — Telephone Encounter (Signed)
Patient will need to reschedule if calls back

## 2020-03-12 NOTE — Telephone Encounter (Signed)
Have called the patient over 5 times this morning to start video visit, keeps going straight to voicemail

## 2020-03-14 DIAGNOSIS — M542 Cervicalgia: Secondary | ICD-10-CM | POA: Diagnosis not present

## 2020-03-16 ENCOUNTER — Encounter: Payer: Self-pay | Admitting: Cardiovascular Disease

## 2020-03-16 ENCOUNTER — Telehealth (INDEPENDENT_AMBULATORY_CARE_PROVIDER_SITE_OTHER): Payer: Medicare Other | Admitting: Cardiovascular Disease

## 2020-03-16 VITALS — Ht 67.0 in

## 2020-03-16 DIAGNOSIS — I2584 Coronary atherosclerosis due to calcified coronary lesion: Secondary | ICD-10-CM | POA: Diagnosis not present

## 2020-03-16 DIAGNOSIS — I739 Peripheral vascular disease, unspecified: Secondary | ICD-10-CM | POA: Diagnosis not present

## 2020-03-16 DIAGNOSIS — G459 Transient cerebral ischemic attack, unspecified: Secondary | ICD-10-CM | POA: Diagnosis not present

## 2020-03-16 DIAGNOSIS — E785 Hyperlipidemia, unspecified: Secondary | ICD-10-CM | POA: Diagnosis not present

## 2020-03-16 DIAGNOSIS — I10 Essential (primary) hypertension: Secondary | ICD-10-CM | POA: Diagnosis not present

## 2020-03-16 DIAGNOSIS — M542 Cervicalgia: Secondary | ICD-10-CM | POA: Diagnosis not present

## 2020-03-16 DIAGNOSIS — I2510 Atherosclerotic heart disease of native coronary artery without angina pectoris: Secondary | ICD-10-CM

## 2020-03-16 MED ORDER — AMLODIPINE BESYLATE 5 MG PO TABS
5.0000 mg | ORAL_TABLET | Freq: Every day | ORAL | 1 refills | Status: DC
Start: 2020-03-16 — End: 2020-05-08

## 2020-03-16 NOTE — Progress Notes (Signed)
Virtual Visit via Video Note   This visit type was conducted due to national recommendations for restrictions regarding the COVID-19 Pandemic (e.g. social distancing) in an effort to limit this patient's exposure and mitigate transmission in our community.  Due to her co-morbid illnesses, this patient is at least at moderate risk for complications without adequate follow up.  This format is felt to be most appropriate for this patient at this time.  All issues noted in this document were discussed and addressed.  A limited physical exam was performed with this format.  Please refer to the patient's chart for her consent to telehealth for [facility].  The patient was identified using 2 identifiers.  Date:  03/16/2020   ID:  [name], DOB May 26, 1950, MRN [id number]  Patient Location: Home Provider Location: Office/Clinic  PCP:  Courtney Osmond, DO  Cardiologist:  Courtney Latch, MD  Electrophysiologist:  None   Evaluation Performed:  Follow-Up Visit  Chief Complaint:  Hypertension, HL  History of Present Illness:    The patient does not have symptoms concerning for COVID-19 infection (fever, chills, cough, or new shortness of breath).   [name] is a 70 y.o. female with asymptomatic coronary calcification, L subclavian stenosis, hypertension, hyperlipidemia, CKD 3, and stroke here for follow-up.  She was admitted 07/2017 with shortness of breath.  LE Doppler was negative for DVT.  V/Q scan showed a wedge shaped perfusion defect but was deemed intermediate probability.  There was no evidence of RV strain on echo.  He was started on Eliquis for anticoagulation.  She has some lower extremity edema that improves somewhat when she takes lasix or when she elevates them.  She still has shortness of brewath that she states has been present since her stroke.  It is worse when she goes up and down steps or when she gets in the shower.  She tries to walk for 25 minutes daily.   She followed up with Courtney Ferries, PA-C on 08/2017 and was referred for [facility] to rule out ischemia.  Lexiscan Myoview 09/2017 revealed LVEF 66% and no ischemia.  [name] previously smoked for 25-30 years.  She also had a lung puncture injury that required repeat thoracentesis and drain placement.  Her BP at home has been running around 130/80.  She has not tolerated several statins in the past.  At her last appointment she reported persistent shortness of breath and wheezing.  She had a [employer] 12/2019 that revealed LVEF 66%.  There is an apical anterior and apical defect likely to be due to breast attenuation.  However cannot rule out mild ischemia.  She was also referred for pulmonary function testing which showed reduced lung volumes, and increased FEV1/FVC ratio, and diffusion defect suggestive of interstitial processes.  She was referred to pulmonology and it was thought to be related to deconditioning as well as reactive airway diseases.  They treated her for asthma with high-dose Breo and albuterol.  She has been doing well. She thinks that the medications have helped.  She gets short of breath and coughs if she moves too fast she gets short of breath, but albuterol helps.  She is struggling with pain in her back since she feel and broke her hip.  She has no lower extremity edema, orthopnea or PND.  She does not check her BP at home often but when she does it's usually >130/80.   Past Medical History:  Diagnosis Date  . Anemia   .  Cervical neuritis   . Chronic kidney disease    stage 3  . Dyslipidemia   . GERD (gastroesophageal reflux disease)   . History of transient ischemic attack (TIA)   . HTN (hypertension)   . Hyperlipidemia   . Migraine   . Migraine headache   . Occipital neuralgia    Left  . Pulmonary embolism ([place]) 2017  . Renal insufficiency   . Stroke [facility]) 10/2013     Current Outpatient Medications  Medication Sig Dispense Refill  . acetaminophen  (TYLENOL) 325 MG tablet Take 2 tablets (650 mg total) by mouth 4 (four) times daily -  with meals and at bedtime.    Marland Kitchen albuterol (VENTOLIN HFA) 108 (90 Base) MCG/ACT inhaler Inhale 2 puffs into the lungs every 6 (six) hours as needed for wheezing or shortness of breath. 8 g 6  . calcium carbonate (OS-CAL - DOSED IN MG OF ELEMENTAL CALCIUM) 1250 (500 Ca) MG tablet Take 1 tablet (500 mg of elemental calcium total) by mouth daily with breakfast.    . cetirizine (ZYRTEC) 10 MG tablet Take 10 mg by mouth daily with breakfast.    . clopidogrel (PLAVIX) 75 MG tablet TAKE 1 TABLET(75 MG) BY MOUTH DAILY 90 tablet 0  . diclofenac Sodium (VOLTAREN) 1 % GEL APPLY 2 GRAMS TOPICALLY TO LEFT HIP FOUR TIMES DAILY 300 g 0  . doxepin (SINEQUAN) 100 MG capsule Take by mouth.    . ELDERBERRY PO Take 1 tablet by mouth daily with breakfast.    . fluticasone (FLONASE) 50 MCG/ACT nasal spray Place 1 spray into both nostrils daily.  2  . fluticasone furoate-vilanterol (BREO ELLIPTA) 200-25 MCG/INH AEPB Inhale 1 puff into the lungs daily. 60 each 11  . furosemide (LASIX) 40 MG tablet Take 40 mg by mouth daily.    Marland Kitchen gabapentin (NEURONTIN) 300 MG capsule TAKE 2 CAPSULES BY MOUTH TWICE DAILY AT 8AM AND 4PM AND 3 CAPSULES BY MOUTH EVERY NIGHT AT BEDTIME 210 capsule 1  . Multiple Vitamins-Minerals (HAIR/SKIN/NAILS/BIOTIN) TABS Take 2 tablets by mouth daily with breakfast.    . pantoprazole (PROTONIX) 40 MG tablet TAKE 1 TABLET(40 MG) BY MOUTH DAILY WITH BREAKFAST 30 tablet 10  . prochlorperazine (COMPAZINE) 10 MG tablet Take 1 tablet (10 mg total) by mouth 2 (two) times daily as needed for nausea or vomiting. 10 tablet 0  . propranolol (INDERAL) 10 MG tablet TAKE 1 TABLET(10 MG) BY MOUTH TWICE DAILY 90 tablet 0  . sertraline (ZOLOFT) 50 MG tablet Take 1 tablet (50 mg total) by mouth daily. 60 tablet 1  . tiZANidine (ZANAFLEX) 2 MG tablet Take 1 tablet (2 mg total) by mouth 3 (three) times daily. 270 tablet 1  . traMADol (ULTRAM)  50 MG tablet Take 1 tablet (50 mg total) by mouth every 8 (eight) hours as needed for moderate pain. 90 tablet 5  . traZODone (DESYREL) 50 MG tablet TAKE 1/2 TO 1 TABLET(25 TO 50 MG) BY MOUTH AT BEDTIME AS NEEDED FOR SLEEP 90 tablet 1  . Vitamin D, Ergocalciferol, (DRISDOL) 1.25 MG (50000 UNIT) CAPS capsule Take 1 capsule (50,000 Units total) by mouth every 7 (seven) days. 4 capsule 0  . zonisamide (ZONEGRAN) 100 MG capsule TAKE 1 CAPSULE(100 MG) BY MOUTH TWICE DAILY 60 capsule 5  . zonisamide (ZONEGRAN) 25 MG capsule take 25 mg twice daily prn in addition to 100 mg twice daily. 180 capsule 1  . Alirocumab (PRALUENT) 75 MG/ML SOAJ Inject 75 mg into the skin every  14 (fourteen) days. (Patient not taking: Reported on 03/16/2020) 6 mL 3  . amLODipine (NORVASC) 2.5 MG tablet Take 1 tablet (2.5 mg total) by mouth daily. CALL OFFICE FOR APPOINTMENT (Patient not taking: Reported on 03/16/2020) 30 tablet 0  . Baclofen 5 MG TABS Take 1 tablet by mouth 3 (three) times daily. (Patient not taking: Reported on 03/16/2020) 270 tablet 1  . ezetimibe (ZETIA) 10 MG tablet Take 10 mg by mouth daily. (Patient not taking: Reported on 03/16/2020)    . polyethylene glycol (MIRALAX / GLYCOLAX) 17 g packet Take 17 g by mouth daily as needed. (Patient not taking: Reported on 03/16/2020) 14 each 0  . predniSONE (DELTASONE) 10 MG tablet  (Patient not taking: Reported on 03/16/2020)    . senna (SENOKOT) 8.6 MG TABS tablet Take 1 tablet (8.6 mg total) by mouth daily. (Patient not taking: Reported on 03/16/2020) 120 tablet 0   No current facility-administered medications for this visit.    Allergies:   Shellfish allergy and Statins    Social History:  The patient  reports that she quit smoking about 8 years ago. Her smoking use included cigarettes. She smoked 0.30 packs per day. She has never used smokeless tobacco. She reports that she does not drink alcohol and does not use drugs.   Family History:  The patient's family history includes  Alzheimer's disease in her mother; Cancer in her brother, brother, and maternal grandfather; Cancer (age of onset: 50) in her mother; Dementia in her brother and maternal grandmother; Hyperlipidemia in her brother and brother; Lung cancer in her father; Prostate cancer in her father.    ROS:  Please see the history of present illness.   Otherwise, review of systems are positive for none.   All other systems are reviewed and negative.    PHYSICAL EXAM: Ht 5\' 7"  (1.702 m)   BMI 28.51 kg/m  GENERAL: Well-appearing.  No acute distress. HEENT: Pupils equal round.  Oral mucosa unremarkable NECK:  No jugular venous distention, no visible thyromegaly EXT:  No edema, no cyanosis no clubbing SKIN:  No rashes no nodules NEURO:  Speech fluent.  Cranial nerves grossly intact.  Moves all 4 extremities freely PSYCH:  Cognitively intact, oriented to person place and time  EKG:  EKG is not ordered today.   Echo 07/24/17: Study Conclusions  - Left ventricle: The cavity size was normal. Systolic function was   normal. The estimated ejection fraction was in the range of 55%   to 60%. Wall motion was normal; there were no regional wall   motion abnormalities. Doppler parameters are consistent with   abnormal left ventricular relaxation (grade 1 diastolic   dysfunction).  Lexiscan Myoview 12/2019:  The left ventricular ejection fraction is hyperdynamic (>65%).  Nuclear stress EF: 66%.  There was no ST segment deviation noted during stress.  Defect 1: There is a small defect of moderate severity present in the apical anterior and apex location.  This is a low risk study.   Low risk stress nuclear study with partially reversible distal anterior/apical defect likely related to shifting breast attenuation.  Cannot rule out minimal ischemia.  Gated ejection fraction 66% with normal wall motion.  Carotid Dopplers 01/2020: 1 to 39% ICA stenosis bilaterally.  Recent Labs: 07/31/2019: B  Natriuretic Peptide 66.3; Magnesium 2.1 08/29/2019: ALT 18; BUN 8; Creatinine, Ser 0.89; Hemoglobin 13.4; Platelets 224; Potassium 3.2; Sodium 139    Lipid Panel    Component Value Date/Time   CHOL 210 (H)  06/13/2019 0927   TRIG 224 (H) 06/13/2019 0927   HDL 48 06/13/2019 0927   CHOLHDL 4.4 06/13/2019 0927   CHOLHDL 2.2 07/13/2015 1056   VLDL 10 07/13/2015 1056   LDLCALC 123 (H) 06/13/2019 0927   LDLDIRECT 156 (H) 03/27/2017 0939     07/30/2017: Hemoglobin A1c 5.5% Hemoglobin 12.9, platelets 196 09/25/2017: Potassium 3.9, creatinine 1.4  Wt Readings from Last 3 Encounters:  02/16/20 182 lb (82.6 kg)  01/24/20 184 lb (83.5 kg)  01/24/20 183 lb 9.6 oz (83.3 kg)      ASSESSMENT AND PLAN:  # Prior stroke: # Carotid stenosis: Continue clopidogrel and Praluent.  She will come for fasting lipids and CMP.  Mild carotid stenosis on Dopplers 01/2020.  # Shortness of breath: # Wheezing:  Improved since starting treatment for asthma.  # Coronary calcification:  # Hyperlipidemia:  Moderate coronary calcification on chest CT as above.  Lexiscan Myoview was likely negative for ischemia.  There was a concern over mild anterior apical ischemia, though this is likely breast attenuation artifact.  She is asymptomatic.  Continue medical management.  She will come for fasting lipids and a CMP.  # Prior PE: Perioperative.  She completed 6 months of Eliquis.  # Hypertension: BP is running a little high.  We will increase amlodipine to 5 mg daily.  Continue to track blood pressure and follow-up in 1 to 2 months.  Current medicines are reviewed at length with the patient today.  The patient does not have concerns regarding medicines.  The following changes have been made: Increase amlodipine to 5 mg.  Labs/ tests ordered today include:   No orders of the defined types were placed in this encounter.   COVID-19 Education: The signs and symptoms of COVID-19 were discussed with the patient  and how to seek care for testing (follow up with PCP or arrange E-visit).  The importance of social distancing was discussed today.  Time:   Today, I have spent 22 minutes with the patient with telehealth technology discussing the above problems.     Disposition:   FU with Saman Giddens C. Oval Linsey, MD, [facility] in 1-2 months.    Signed, Ram Haugan C. Oval Linsey, MD, [facility] [place] [facility]  03/16/2020 10:11 AM    [facility]

## 2020-03-16 NOTE — Patient Instructions (Signed)
Medication Instructions:  Increase Amlodipine to 5 mg daily   *If you need a refill on your cardiac medications before your next appointment, please call your pharmacy*   Lab Work: Fasting LIPID/CMET   If you have labs (blood work) drawn today and your tests are completely normal, you will receive your results only by: Marland Kitchen MyChart Message (if you have MyChart) OR . A paper copy in the mail If you have any lab test that is abnormal or we need to change your treatment, we will call you to review the results.   Follow-Up: At [facility], you and your health needs are our priority.  As part of our continuing mission to provide you with exceptional heart care, we have created designated Provider Care Teams.  These Care Teams include your primary Cardiologist (physician) and Advanced Practice Providers (APPs -  Physician Assistants and Nurse Practitioners) who all work together to provide you with the care you need, when you need it.  We recommend signing up for the patient portal called "MyChart".  Sign up information is provided on this After Visit Summary.  MyChart is used to connect with patients for Virtual Visits (Telemedicine).  Patients are able to view lab/test results, encounter notes, upcoming appointments, etc.  Non-urgent messages can be sent to your provider as well.   To learn more about what you can do with MyChart, go to [email or web address].    Your next appointment:   1 month(s)  The format for your next appointment:   In Person  Provider:   You may see Skeet Latch, MD or one of the following Advanced Practice Providers on your designated Care Team:    Kerin Ransom, PA-C  [place], [place]  Coletta Memos, [place]

## 2020-03-16 NOTE — Addendum Note (Signed)
Addended by: Caprice Beaver T on: 03/16/2020 10:17 AM   Modules accepted: Orders

## 2020-03-20 ENCOUNTER — Other Ambulatory Visit: Payer: Self-pay

## 2020-03-20 ENCOUNTER — Encounter: Payer: Self-pay | Admitting: Physical Medicine & Rehabilitation

## 2020-03-20 ENCOUNTER — Encounter: Payer: Medicare Other | Attending: Physical Medicine & Rehabilitation | Admitting: Physical Medicine & Rehabilitation

## 2020-03-20 VITALS — BP 130/85 | HR 77 | Temp 98.8°F | Ht 67.0 in | Wt 186.4 lb

## 2020-03-20 DIAGNOSIS — I2510 Atherosclerotic heart disease of native coronary artery without angina pectoris: Secondary | ICD-10-CM | POA: Diagnosis not present

## 2020-03-20 DIAGNOSIS — I2584 Coronary atherosclerosis due to calcified coronary lesion: Secondary | ICD-10-CM

## 2020-03-20 DIAGNOSIS — M5412 Radiculopathy, cervical region: Secondary | ICD-10-CM | POA: Diagnosis not present

## 2020-03-20 DIAGNOSIS — M533 Sacrococcygeal disorders, not elsewhere classified: Secondary | ICD-10-CM | POA: Insufficient documentation

## 2020-03-20 DIAGNOSIS — M542 Cervicalgia: Secondary | ICD-10-CM | POA: Diagnosis not present

## 2020-03-20 NOTE — Patient Instructions (Signed)
Sacroiliac nerve blocks have worked better than the sacroiliac joint injection  Will do sacroiliac blocks on left side

## 2020-03-20 NOTE — Progress Notes (Signed)
Subjective:    Patient ID: [name], female    DOB: [birth year], 70 y.o.   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  This has been subsequently settled and she has done well with just gabapentin for this. She has had problems with lumbar stenosis not work-related, she has been seen by Dr. Rolena Infante from orthospine. The patient underwent right-sided sacroiliac nerve blocks on 02/16/2020.  She has had excellent relief.  She feels like her pain medications are working better than they were before.  She is off all schedule II medications.  She is taking only tramadol Just takes tramadol at night  Patient now notes left-sided upper buttock pain.  She feels like this is a similar area as on the right side prior to the injections.  She has undergone sacroiliac intra-articular injection bilaterally 12/23/2019 with good relief of upper buttock pain lower back pain Pain Inventory Average Pain 8 Pain Right Now 8 My pain is sharp, burning and stabbing  In the last 24 hours, has pain interfered with the following? General activity 8 Relation with others 8 Enjoyment of life 8 What TIME of day is your pain at its worst? morning , daytime, evening and night Sleep (in general) Poor  Pain is worse with: walking and standing Pain improves with: rest Relief from Meds: 6  Family History  Problem Relation Age of Onset  . Cancer Mother 23       breast  . Alzheimer's disease Mother   . Prostate cancer Father   . Cancer Brother   . Hyperlipidemia Brother   . Lung cancer Father   . Dementia Maternal Grandmother   . Cancer Maternal Grandfather   . Cancer Brother   . Hyperlipidemia Brother   . Dementia Brother        frontotemporal dementia   Social History   Socioeconomic History  . Marital status: Married    Spouse name: [name]  . Number of children: 1  . Years of education: 12+  . Highest education  level: High school graduate  Occupational History  . Occupation: ACTIVITY DIRECTOR    Employer: [employer]  . Occupation: CNA  Tobacco Use  . Smoking status: Former Smoker    Packs/day: 0.30    Types: Cigarettes    Quit date: 11/01/2011    Years since quitting: 8.3  . Smokeless tobacco: Never Used  Vaping Use  . Vaping Use: Never used  Substance and Sexual Activity  . Alcohol use: No    Alcohol/week: 0.0 standard drinks  . Drug use: No  . Sexual activity: Yes    Partners: Male    Birth control/protection: Surgical    Comment: 1 sexual partner in last 12 months: married  Other Topics Concern  . Not on file  Social History Narrative   ** Merged History Encounter **       Patient is married ([name]). Patient has one child and grandchild. Her sons name is Shanon Brow. Patient sees them often. 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 Patient spends 6-10 hours with her 6 days a week.     Patient has a college education. Patient is very active in her church.  Hobbies- outside crafts and church choir.   Social Determinants of Health   Financial Resource Strain: Not on file  Food Insecurity: Not on file  Transportation Needs: Not  on file  Physical Activity: Not on file  Stress: Not on file  Social Connections: Not on file   Past Surgical History:  Procedure Laterality Date  . ABDOMINAL HYSTERECTOMY    . BILIARY STENT PLACEMENT N/A 12/12/2015   Procedure: BILIARY STENT PLACEMENT;  Surgeon: Doran Stabler, MD;  Location: [facility];  Service: Endoscopy;  Laterality: N/A;  . BLADDER REPAIR    . CARPAL TUNNEL RELEASE    . CERVICAL FUSION    . CERVICAL SPINE SURGERY    . CHOLECYSTECTOMY N/A 12/06/2015   Procedure: LAPAROSCOPIC CHOLECYSTECTOMY WITH  INTRAOPERATIVE CHOLANGIOGRAM;  Surgeon: Stark Klein, MD;  Location: [facility];  Service: General;  Laterality: N/A;  . ELBOW SURGERY    . ERCP N/A 12/12/2015   Procedure: ENDOSCOPIC  RETROGRADE CHOLANGIOPANCREATOGRAPHY (ERCP);  Surgeon: Doran Stabler, MD;  Location[facility]COPY;  Service: Endoscopy;  Laterality: N/A;  . ESOPHAGOGASTRODUODENOSCOPY N/A 01/28/2016   Procedure: ESOPHAGOGASTRODUODENOSCOPY (EGD) Biliary STENT removal;  Surgeon: Doran Stabler, MD;  Location: [facility];  Service: Gastroenterology;  Laterality: N/A;  . IR GENERIC HISTORICAL  12/17/2015   IR US GUIDE BX ASP/DRAIN 12/17/2015 [facility]  . IR GENERIC HISTORICAL  12/17/2015   IR GUIDED DRAIN W CATHETER PLACEMENT 12/17/2015 [facility]  . IR GENERIC HISTORICAL  12/17/2015   IR SINUS/FIST TUBE CHK-NON GI 12/17/2015 [facility]  . KNEE SURGERY    . LUNG SURGERY    . TEE WITHOUT CARDIOVERSION N/A 12/19/2015   Procedure: TRANSESOPHAGEAL ECHOCARDIOGRAM (TEE);  Surgeon: Josue Hector, MD;  Location[facility] ENDOSCOPY;  Service: Cardiovascular;  Laterality: N/A;   Past Surgical History:  Procedure Laterality Date  . ABDOMINAL HYSTERECTOMY    . BILIARY STENT PLACEMENT N/A 12/12/2015   Procedure: BILIARY STENT PLACEMENT;  Surgeon: Doran Stabler, MD;  Location: [facility];  Service: Endoscopy;  Laterality: N/A;  . BLADDER REPAIR    . CARPAL TUNNEL RELEASE    . CERVICAL FUSION    . CERVICAL SPINE SURGERY    . CHOLECYSTECTOMY N/A 12/06/2015   Procedure: LAPAROSCOPIC CHOLECYSTECTOMY WITH  INTRAOPERATIVE CHOLANGIOGRAM;  Surgeon: Stark Klein, MD;  Location: [place];  Service: General;  Laterality: N/A;  . ELBOW SURGERY    . ERCP N/A 12/12/2015   Procedure: ENDOSCOPIC RETROGRADE CHOLANGIOPANCREATOGRAPHY (ERCP);  Surgeon: Doran Stabler, MD;  Location: [facility] ENDOSCOPY;  Service: Endoscopy;  Laterality: N/A;  . ESOPHAGOGASTRODUODENOSCOPY N/A 01/28/2016   Procedure: ESOPHAGOGASTRODUODENOSCOPY (EGD) Biliary STENT removal;  Surgeon: Doran Stabler, MD;  Location: [facility];  Service: Gastroenterology;  Laterality: N/A;  . IR GENERIC HISTORICAL  12/17/2015   IR US GUIDE BX ASP/DRAIN 12/17/2015  [facility]  . IR GENERIC HISTORICAL  12/17/2015   IR GUIDED DRAIN W CATHETER PLACEMENT 12/17/2015 [facility]  . IR GENERIC HISTORICAL  12/17/2015   IR SINUS/FIST TUBE CHK-NON GI 12/17/2015 [facility]  . KNEE SURGERY    . LUNG SURGERY    . TEE WITHOUT CARDIOVERSION N/A 12/19/2015   Procedure: TRANSESOPHAGEAL ECHOCARDIOGRAM (TEE);  Surgeon: Josue Hector, MD;  Location: [facility] ENDOSCOPY;  Service: Cardiovascular;  Laterality: N/A;   Past Medical History:  Diagnosis Date  . Anemia   . Cervical neuritis   . Chronic kidney disease    stage 3  . Dyslipidemia   . GERD (gastroesophageal reflux disease)   . History of transient ischemic attack (TIA)   . HTN (hypertension)   . Hyperlipidemia   . Migraine   . Migraine  headache   . Occipital neuralgia    Left  . Pulmonary embolism ([place]) 2017  . Renal insufficiency   . Stroke ([place]) 10/2013   BP 130/85   Pulse 77   Temp 98.8 F (37.1 C)   Ht 5\' 7"  (1.702 m)   Wt 186 lb 6.4 oz (84.6 kg)   SpO2 94%   BMI 29.19 kg/m   Opioid Risk Score:   Fall Risk Score:  `1  Depression screen PHQ 2/9  Depression screen [facility] 2/9 01/24/2020 01/03/2020 12/23/2019 11/25/2019 09/02/2019 08/22/2019 08/05/2019  Decreased Interest 1 1 1 1 3  0 1  Down, Depressed, Hopeless 1 1 1 1 3  0 1  PHQ - 2 Score 2 2 2 2 6  0 2  Altered sleeping - 1 - - - - -  Tired, decreased energy - 1 - - - - -  Change in appetite - 1 - - - - -  Feeling bad or failure about yourself  - 1 - - - - -  Trouble concentrating - 1 - - - - -  Moving slowly or fidgety/restless - 1 - - - - -  Suicidal thoughts - 0 - - - - -  PHQ-9 Score - 8 - - - - -  Difficult doing work/chores - Not difficult at all - - - - -  Some recent data might be hidden   Review of Systems  Constitutional: Negative.   HENT: Negative.   Eyes: Negative.   Respiratory: Negative.   Cardiovascular: Negative.   Gastrointestinal: Negative.   Endocrine: Negative.   Genitourinary: Negative.   Musculoskeletal:  Positive for arthralgias and back pain.  Skin: Negative.   Allergic/Immunologic: Negative.   Neurological: Negative.   Hematological: Bruises/bleeds easily.       Plavix  Psychiatric/Behavioral: Negative.   All other systems reviewed and are negative.      Objective:   Physical Exam Vitals and nursing note reviewed.  Constitutional:      Appearance: She is obese.  HENT:     Head: Normocephalic and atraumatic.  Eyes:     Extraocular Movements: Extraocular movements intact.     Conjunctiva/sclera: Conjunctivae normal.     Pupils: Pupils are equal, round, and reactive to light.  Musculoskeletal:     Comments: Patient without tenderness to palpation in the lumbar paraspinals Limited lumbar spine range of motion approximately 50% flexion extension lateral bending.  Neurological:     Mental Status: She is alert and oriented to person, place, and time.     Comments: Motor strength is 5/5 bilateral hip flexor knee extensor ankle dorsiflexor Negative straight leg raising bilaterally Ambulates with a cane no evidence of toe drag or knee instability  Psychiatric:        Mood and Affect: Mood normal.        Behavior: Behavior normal.    There is tenderness at the left PSIS area       Assessment & Plan:  #1.  Chronic low back pain she did have complex left-sided sacral and pelvic fractures in March 2021.  She has had good fracture healing.  She does have concomitant spinal stenosis.  She has done quite well with right sacroiliac injection as well as right sacroiliac nerve blocks.  She has had prolonged relief.  We will do left side.  If only temporary relief consider radiofrequency neurotomy

## 2020-03-23 NOTE — Progress Notes (Signed)
°  This encounter was created in error - please disregard.

## 2020-03-28 ENCOUNTER — Ambulatory Visit: Payer: Medicare Other | Admitting: Gastroenterology

## 2020-03-29 ENCOUNTER — Other Ambulatory Visit: Payer: Self-pay | Admitting: Student in an Organized Health Care Education/Training Program

## 2020-04-02 ENCOUNTER — Other Ambulatory Visit: Payer: Self-pay

## 2020-04-02 ENCOUNTER — Other Ambulatory Visit: Payer: Medicare Other

## 2020-04-02 ENCOUNTER — Ambulatory Visit
Admission: RE | Admit: 2020-04-02 | Discharge: 2020-04-02 | Disposition: A | Discharge status: Home / Self Care | Payer: Medicare Other | Source: Ambulatory Visit | Attending: Family Medicine | Admitting: Family Medicine

## 2020-04-02 DIAGNOSIS — Z1231 Encounter for screening mammogram for malignant neoplasm of breast: Secondary | ICD-10-CM | POA: Diagnosis not present

## 2020-04-03 DIAGNOSIS — M542 Cervicalgia: Secondary | ICD-10-CM | POA: Diagnosis not present

## 2020-04-06 DIAGNOSIS — M5412 Radiculopathy, cervical region: Secondary | ICD-10-CM | POA: Diagnosis not present

## 2020-04-06 DIAGNOSIS — M542 Cervicalgia: Secondary | ICD-10-CM | POA: Diagnosis not present

## 2020-04-10 ENCOUNTER — Other Ambulatory Visit: Payer: Self-pay

## 2020-04-10 ENCOUNTER — Encounter: Payer: Self-pay | Admitting: Physical Medicine & Rehabilitation

## 2020-04-10 ENCOUNTER — Encounter: Payer: Medicare Other | Attending: Physical Medicine & Rehabilitation | Admitting: Physical Medicine & Rehabilitation

## 2020-04-10 VITALS — Temp 98.7°F | Ht 67.0 in | Wt 185.0 lb

## 2020-04-10 DIAGNOSIS — M533 Sacrococcygeal disorders, not elsewhere classified: Secondary | ICD-10-CM | POA: Diagnosis not present

## 2020-04-10 NOTE — Patient Instructions (Signed)
Left sacroiliac nerve blocks performed today.  Hopefully this will last as long as the right sacroiliac nerve blocks.  If this only gives a temporary relief i.e. a few days or weeks, may consider radiofrequency neurotomy of the same nerves.

## 2020-04-10 NOTE — Progress Notes (Signed)
  PROCEDURE RECORD [facility]   Name: ETOY MCDONNELL DOB:[birth year] MRN: [id number]  Date:04/10/2020  Physician: Alysia Penna, MD    Nurse/CMA: Truman Hayward, CMA  Allergies:  Allergies  Allergen Reactions  . Shellfish Allergy Anaphylaxis    All seafood  . Statins Other (See Comments)    Arthralgias (severe) with atorvastatin, mild arthralgias with rosuvastatin but willing to continue taking it    Consent Signed: Yes.    Is patient diabetic? No.  CBG today?   Pregnant: No. LMP: No LMP recorded. Patient has had a hysterectomy. (age [age])  Anticoagulants: yes (Plavix) Anti-inflammatory: no Antibiotics: no  Procedure: Left Sacroiliac Joint Injection Position: Prone Start Time: 1:20pm End Time: 1:29pm  Fluoro Time: 64  RN/CMA Truman Hayward, CMA Adanna Zuckerman, CMA    Time 1:00pm 1:39    BP 113/76 133/68    Pulse 8 84    Respirations 16 16    O2 Sat 93 95    S/S 6 6    Pain Level 10/10 0/10     D/C home with husband, [name] & O X 3, D/C instructions reviewed, and sits independently.

## 2020-04-10 NOTE — Progress Notes (Signed)
L5 dorsal ramus S1-S2-S3 lateral branch blocks under fluoroscopic guidance Left side  Informed consent was obtained after describing risks and benefits of the procedure with patient these include bleeding bruising and infection.  He elects to proceed and has given written consent.  Patient placed prone on fluoroscopy table Betadine prep sterile drape a 25-gauge 1.5 inch needle was used to anesthetize skin and subcu tissue with 1% lidocaine 1 cc into each of 4 sites.  Then a 22-gauge 3.5" needle was inserted under fluoroscopic guidance for starting the S1 SAP sacral ala junction.  Bone contact made.  Isovue 200 x 0.5 mL demonstrated no intravascular uptake then 0.5 mL of 2% lidocaine was injected.  Then the lateral aspect of the S1, S2, S3 foramen was targeted.  Bone contact made out, Isovue-200 times 0.5 mL demonstrated no nerve root or intravascular uptake within 0.5 mL of 2% lidocaine solution was injected after negative drawback for blood.  Patient tolerated procedure well.  Postinjection instructions given. 

## 2020-04-12 DIAGNOSIS — Z981 Arthrodesis status: Secondary | ICD-10-CM | POA: Diagnosis not present

## 2020-04-12 DIAGNOSIS — M5412 Radiculopathy, cervical region: Secondary | ICD-10-CM | POA: Diagnosis not present

## 2020-04-18 DIAGNOSIS — R0781 Pleurodynia: Secondary | ICD-10-CM | POA: Diagnosis not present

## 2020-04-18 DIAGNOSIS — M1712 Unilateral primary osteoarthritis, left knee: Secondary | ICD-10-CM | POA: Diagnosis not present

## 2020-04-18 DIAGNOSIS — M25512 Pain in left shoulder: Secondary | ICD-10-CM | POA: Diagnosis not present

## 2020-04-18 DIAGNOSIS — M25562 Pain in left knee: Secondary | ICD-10-CM | POA: Diagnosis not present

## 2020-04-23 ENCOUNTER — Other Ambulatory Visit: Payer: Self-pay | Admitting: Student in an Organized Health Care Education/Training Program

## 2020-04-30 DIAGNOSIS — D631 Anemia in chronic kidney disease: Secondary | ICD-10-CM | POA: Diagnosis not present

## 2020-04-30 DIAGNOSIS — R3129 Other microscopic hematuria: Secondary | ICD-10-CM | POA: Diagnosis not present

## 2020-04-30 DIAGNOSIS — I129 Hypertensive chronic kidney disease with stage 1 through stage 4 chronic kidney disease, or unspecified chronic kidney disease: Secondary | ICD-10-CM | POA: Diagnosis not present

## 2020-04-30 DIAGNOSIS — N183 Chronic kidney disease, stage 3 unspecified: Secondary | ICD-10-CM | POA: Diagnosis not present

## 2020-04-30 DIAGNOSIS — N2581 Secondary hyperparathyroidism of renal origin: Secondary | ICD-10-CM | POA: Diagnosis not present

## 2020-05-01 DIAGNOSIS — M542 Cervicalgia: Secondary | ICD-10-CM | POA: Diagnosis not present

## 2020-05-01 DIAGNOSIS — M5412 Radiculopathy, cervical region: Secondary | ICD-10-CM | POA: Diagnosis not present

## 2020-05-04 DIAGNOSIS — N183 Chronic kidney disease, stage 3 unspecified: Secondary | ICD-10-CM | POA: Diagnosis not present

## 2020-05-04 DIAGNOSIS — M4802 Spinal stenosis, cervical region: Secondary | ICD-10-CM | POA: Diagnosis not present

## 2020-05-04 DIAGNOSIS — M5030 Other cervical disc degeneration, unspecified cervical region: Secondary | ICD-10-CM | POA: Diagnosis not present

## 2020-05-07 ENCOUNTER — Telehealth: Payer: Self-pay

## 2020-05-07 ENCOUNTER — Telehealth: Payer: Self-pay | Admitting: Cardiovascular Disease

## 2020-05-07 NOTE — Telephone Encounter (Signed)
   [facility] Pre-operative Risk Assessment    HEARTCARE STAFF: - Please ensure there is not already an duplicate clearance open for this procedure. - Under Visit Info/Reason for Call, type in Other and utilize the format Clearance MM/DD/YY or Clearance TBD. Do not use dashes or single digits. - If request is for dental extraction, please clarify the # of teeth to be extracted.  Request for surgical clearance:  1. What type of surgery is being performed? Anterior cervical discectomy fusion C3-4    2. When is this surgery scheduled? TBD   3. What type of clearance is required (medical clearance vs. Pharmacy clearance to hold med vs. Both)? Both  4. Are there any medications that need to be held prior to surgery and how long? Plavix   5. Practice name and name of physician performing surgery? [facility]   6. What is the office phone number? [phone]   7.   What is the office fax number? Orson Slick [phone]  8.   Anesthesia type (None, local, MAC, general) ? General   Meryl Crutch 05/07/2020, 10:19 AM  _________________________________________________________________   (provider comments below)

## 2020-05-07 NOTE — Telephone Encounter (Signed)
Clopidogrel was Rx'd for a CVA in 2015 and is managed by primary care.  This will need to be addressed by her PCP.  I have left a message for the pt to call back.  Richardson Dopp, PA-C    05/07/2020 11:27 AM

## 2020-05-07 NOTE — Telephone Encounter (Signed)
Duplicate phone note. Removing from preop pool. Richardson Dopp, PA-C    05/07/2020 12:07 PM

## 2020-05-07 NOTE — Telephone Encounter (Signed)
   [facility] Pre-operative Risk Assessment    HEARTCARE STAFF: - Please ensure there is not already an duplicate clearance open for this procedure. - Under Visit Info/Reason for Call, type in Other and utilize the format Clearance MM/DD/YY or Clearance TBD. Do not use dashes or single digits. - If request is for dental extraction, please clarify the # of teeth to be extracted.  Request for surgical clearance:  1. What type of surgery is being performed? Cervical fusion  2. When is this surgery scheduled? TBD   3. What type of clearance is required (medical clearance vs. Pharmacy clearance to hold med vs. Both)? Medical  4. Are there any medications that need to be held prior to surgery and how long? Plavix, leaving up cardiologist  5. Practice name and name of physician performing surgery? [facility], Dr. Rolena Infante  6. What is the office phone number? [phone]   7.   What is the office fax number? [phone]  8.   Anesthesia type (None, local, MAC, general) ? general   [name] 05/07/2020, 10:14 AM  _________________________________________________________________   (provider comments below)

## 2020-05-07 NOTE — Telephone Encounter (Signed)
I tried to reach Dr. Rolena Infante surgery scheduler Hassan Buckler, though no receptionist was unable to locate scheduler. I did inform that we will send over notes that indicate that PCP will need to be reached to get clearance for Plavix. Pt was placed on Plavix for CV 2015, see notes. Please call the office if there are any other questions please call [phone].

## 2020-05-08 ENCOUNTER — Other Ambulatory Visit: Payer: Self-pay

## 2020-05-08 ENCOUNTER — Ambulatory Visit: Payer: Self-pay | Admitting: Orthopedic Surgery

## 2020-05-08 ENCOUNTER — Encounter: Payer: Medicare Other | Attending: Physical Medicine & Rehabilitation | Admitting: Physical Medicine & Rehabilitation

## 2020-05-08 ENCOUNTER — Encounter: Payer: Self-pay | Admitting: Physical Medicine & Rehabilitation

## 2020-05-08 VITALS — BP 133/85 | HR 80 | Temp 98.8°F | Ht 67.0 in | Wt 182.6 lb

## 2020-05-08 DIAGNOSIS — Z79891 Long term (current) use of opiate analgesic: Secondary | ICD-10-CM | POA: Diagnosis not present

## 2020-05-08 DIAGNOSIS — I2584 Coronary atherosclerosis due to calcified coronary lesion: Secondary | ICD-10-CM

## 2020-05-08 DIAGNOSIS — Z5181 Encounter for therapeutic drug level monitoring: Secondary | ICD-10-CM | POA: Diagnosis not present

## 2020-05-08 DIAGNOSIS — I2510 Atherosclerotic heart disease of native coronary artery without angina pectoris: Secondary | ICD-10-CM | POA: Diagnosis not present

## 2020-05-08 DIAGNOSIS — G894 Chronic pain syndrome: Secondary | ICD-10-CM | POA: Insufficient documentation

## 2020-05-08 NOTE — Progress Notes (Signed)
Subjective:    Patient ID: [name], female    DOB: [birth year], 70 y.o.   MRN: [id number]  HPI  Pt is falling , turns her head and "blacks out"  Was seen by Dr Rolena Infante who ordered MRI of the brain (per pt). She was told she had a vagus nerve problem?  Do not have notes.  Has not been seen by cardiology but has appt with ENT tomorrow   Is scheduled for C3-4 ACDF with Dr Rolena Infante at [facility] March 17th. Takes gabapentin 300mg  qhs Takes tramadol , prescribed by this office 50mg  TID Takes tizanidine 2mg  qhs  Needs cardiology clearance to stop plavix   Pain Inventory Average Pain 10 Pain Right Now 10 My pain is constant  In the last 24 hours, has pain interfered with the following? General activity 10 Relation with others 10 Enjoyment of life 10 What TIME of day is your pain at its worst? morning , daytime, evening and night Sleep (in general) NA  Pain is worse with: sitting and inactivity Pain improves with: rest and medication Relief from Meds: 4  Family History  Problem Relation Age of Onset  . Cancer Mother 39       breast  . Alzheimer's disease Mother   . Prostate cancer Father   . Cancer Brother   . Hyperlipidemia Brother   . Lung cancer Father   . Dementia Maternal Grandmother   . Cancer Maternal Grandfather   . Cancer Brother   . Hyperlipidemia Brother   . Dementia Brother        frontotemporal dementia   Social History   Socioeconomic History  . Marital status: Married    Spouse name: [name]  . Number of children: 1  . Years of education: 12+  . Highest education level: High school graduate  Occupational History  . Occupation: ACTIVITY DIRECTOR    Employer: [employer]  . Occupation: CNA  Tobacco Use  . Smoking status: Former Smoker    Packs/day: 0.30    Types: Cigarettes    Quit date: 11/01/2011    Years since quitting: 8.5  . Smokeless tobacco: Never Used  Vaping Use  . Vaping Use: Never used  Substance and Sexual Activity  . Alcohol  use: No    Alcohol/week: 0.0 standard drinks  . Drug use: No  . Sexual activity: Yes    Partners: Male    Birth control/protection: Surgical    Comment: 1 sexual partner in last 12 months: married  Other Topics Concern  . Not on file  Social History Narrative   ** Merged History Encounter **       Patient is married ([name]). Patient has one child and grandchild. Her sons name is Shanon Brow. Patient sees them often. 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 Patient spends 6-10 hours with her 6 days a week.     Patient has a college education. Patient is very active in her church.  Hobbies- outside crafts and church choir.   Social Determinants of Health   Financial Resource Strain: Not on file  Food Insecurity: Not on file  Transportation Needs: Not on file  Physical Activity: Not on file  Stress: Not on file  Social Connections: Not on file   Past Surgical History:  Procedure Laterality Date  . ABDOMINAL HYSTERECTOMY    . BILIARY STENT PLACEMENT N/A 12/12/2015   Procedure: BILIARY STENT PLACEMENT;  Surgeon: Nelida Meuse  III, MD;  Location: [place];  Service: Endoscopy;  Laterality: N/A;  . BLADDER REPAIR    . CARPAL TUNNEL RELEASE    . CERVICAL FUSION    . CERVICAL SPINE SURGERY    . CHOLECYSTECTOMY N/A 12/06/2015   Procedure: LAPAROSCOPIC CHOLECYSTECTOMY WITH  INTRAOPERATIVE CHOLANGIOGRAM;  Surgeon: Stark Klein, MD;  Location: [place];  Service: General;  Laterality: N/A;  . ELBOW SURGERY    . ERCP N/A 12/12/2015   Procedure: ENDOSCOPIC RETROGRADE CHOLANGIOPANCREATOGRAPHY (ERCP);  Surgeon: Doran Stabler, MD;  Location: [facility] ENDOSCOPY;  Service: Endoscopy;  Laterality: N/A;  . ESOPHAGOGASTRODUODENOSCOPY N/A 01/28/2016   Procedure: ESOPHAGOGASTRODUODENOSCOPY (EGD) Biliary STENT removal;  Surgeon: Doran Stabler, MD;  Location: [facility] ENDOSCOPY;  Service: Gastroenterology;  Laterality: N/A;  . IR GENERIC HISTORICAL  12/17/2015   IR  US GUIDE BX ASP/DRAIN 12/17/2015 [facility]  . IR GENERIC HISTORICAL  12/17/2015   IR GUIDED DRAIN W CATHETER PLACEMENT 12/17/2015 [facility]  . IR GENERIC HISTORICAL  12/17/2015   IR SINUS/FIST TUBE CHK-NON GI 12/17/2015 [facility]  . KNEE SURGERY    . LUNG SURGERY    . TEE WITHOUT CARDIOVERSION N/A 12/19/2015   Procedure: TRANSESOPHAGEAL ECHOCARDIOGRAM (TEE);  Surgeon: Josue Hector, MD;  Location: [facility] ENDOSCOPY;  Service: Cardiovascular;  Laterality: N/A;   Past Surgical History:  Procedure Laterality Date  . ABDOMINAL HYSTERECTOMY    . BILIARY STENT PLACEMENT N/A 12/12/2015   Procedure: BILIARY STENT PLACEMENT;  Surgeon: Doran Stabler, MD;  Location: [place] ENDOSCOPY;  Service: Endoscopy;  Laterality: N/A;  . BLADDER REPAIR    . CARPAL TUNNEL RELEASE    . CERVICAL FUSION    . CERVICAL SPINE SURGERY    . CHOLECYSTECTOMY N/A 12/06/2015   Procedure: LAPAROSCOPIC CHOLECYSTECTOMY WITH  INTRAOPERATIVE CHOLANGIOGRAM;  Surgeon: Stark Klein, MD;  Location: [employer];  Service: General;  Laterality: N/A;  . ELBOW SURGERY    . ERCP N/A 12/12/2015   Procedure: ENDOSCOPIC RETROGRADE CHOLANGIOPANCREATOGRAPHY (ERCP);  Surgeon: Doran Stabler, MD;  Location: [facility] ENDOSCOPY;  Service: Endoscopy;  Laterality: N/A;  . ESOPHAGOGASTRODUODENOSCOPY N/A 01/28/2016   Procedure: ESOPHAGOGASTRODUODENOSCOPY (EGD) Biliary STENT removal;  Surgeon: Doran Stabler, MD;  Location[facility];  Service: Gastroenterology;  Laterality: N/A;  . IR GENERIC HISTORICAL  12/17/2015   IR US GUIDE BX ASP/DRAIN 12/17/2015 [facility]  . IR GENERIC HISTORICAL  12/17/2015   IR GUIDED DRAIN W CATHETER PLACEMENT 12/17/2015 [facility]  . IR GENERIC HISTORICAL  12/17/2015   IR SINUS/FIST TUBE CHK-NON GI 12/17/2015 [facility]  . KNEE SURGERY    . LUNG SURGERY    . TEE WITHOUT CARDIOVERSION N/A 12/19/2015   Procedure: TRANSESOPHAGEAL ECHOCARDIOGRAM (TEE);  Surgeon: Josue Hector, MD;  Location: [facility] ENDOSCOPY;  Service:  Cardiovascular;  Laterality: N/A;   Past Medical History:  Diagnosis Date  . Anemia   . Cervical neuritis   . Chronic kidney disease    stage 3  . Dyslipidemia   . GERD (gastroesophageal reflux disease)   . History of transient ischemic attack (TIA)   . HTN (hypertension)   . Hyperlipidemia   . Migraine   . Migraine headache   . Occipital neuralgia    Left  . Pulmonary embolism ([place]) 2017  . Renal insufficiency   . Stroke ([place]) 10/2013   BP 133/85   Pulse 80   Temp 98.8 F (37.1 C)   Ht 5\' 7"  (1.702 m)   Wt 182  lb 9.6 oz (82.8 kg)   SpO2 94%   BMI 28.60 kg/m   Opioid Risk Score:   Fall Risk Score:  `1  Depression screen PHQ 2/9  Depression screen [facility] 2/9 05/08/2020 01/24/2020 01/03/2020 12/23/2019 11/25/2019 09/02/2019 08/22/2019  Decreased Interest 1 1 1 1 1 3  0  Down, Depressed, Hopeless 1 1 1 1 1 3  0  PHQ - 2 Score 2 2 2 2 2 6  0  Altered sleeping - - 1 - - - -  Tired, decreased energy - - 1 - - - -  Change in appetite - - 1 - - - -  Feeling bad or failure about yourself  - - 1 - - - -  Trouble concentrating - - 1 - - - -  Moving slowly or fidgety/restless - - 1 - - - -  Suicidal thoughts - - 0 - - - -  PHQ-9 Score - - 8 - - - -  Difficult doing work/chores - - Not difficult at all - - - -  Some recent data might be hidden    Review of Systems  Constitutional: Negative.   HENT: Negative.   Eyes: Negative.   Respiratory: Negative.   Cardiovascular: Negative.   Gastrointestinal: Negative.   Endocrine: Negative.   Genitourinary: Negative.   Musculoskeletal: Positive for gait problem.       Right sided pain   Skin: Negative.   Allergic/Immunologic: Negative.   Hematological: Bruises/bleeds easily.       Plavix  Psychiatric/Behavioral: Negative.   All other systems reviewed and are negative.      Objective:   Physical Exam Vitals and nursing note reviewed.  Constitutional:      Appearance: She is obese.  HENT:     Head: Normocephalic and  atraumatic.  Eyes:     Extraocular Movements: Extraocular movements intact.  Neurological:     Mental Status: She is alert and oriented to person, place, and time.     Comments: Gait with poor balance leans backwards,  DTR 2+ BLE Motor is 5/5 in BLE Neg SLR  Psychiatric:        Mood and Affect: Mood normal.        Behavior: Behavior normal.           Assessment & Plan:  1.   Hx pelvic fx with SI mediated pain in a pt with lumbar spinal stenosis, doing relatively well in this regard, pain meds weaned to tramadol 50mg  TID (off Oxy IR and CR)  2.  Cervical spinal stenosis with myelopathy , decompression C3-4 planned for 05/24/20  PMR f/u in 3 mo , Dr Rolena Infante may prescribe post op meds May need inpt stay at [facility] post op if slow to mobilize

## 2020-05-08 NOTE — Patient Instructions (Signed)
Dr Rolena Infante may prescribe pain meds after surgery

## 2020-05-09 ENCOUNTER — Other Ambulatory Visit: Payer: Self-pay | Admitting: Student in an Organized Health Care Education/Training Program

## 2020-05-09 ENCOUNTER — Ambulatory Visit (INDEPENDENT_AMBULATORY_CARE_PROVIDER_SITE_OTHER): Payer: Medicare Other | Admitting: Otolaryngology

## 2020-05-09 VITALS — Temp 97.3°F

## 2020-05-09 DIAGNOSIS — I2584 Coronary atherosclerosis due to calcified coronary lesion: Secondary | ICD-10-CM | POA: Diagnosis not present

## 2020-05-09 DIAGNOSIS — I2510 Atherosclerotic heart disease of native coronary artery without angina pectoris: Secondary | ICD-10-CM

## 2020-05-09 DIAGNOSIS — M5030 Other cervical disc degeneration, unspecified cervical region: Secondary | ICD-10-CM | POA: Diagnosis not present

## 2020-05-09 NOTE — Progress Notes (Signed)
HPI: [name] is a 70 y.o. female who presents is referred by Dr. Rolena Infante for evaluation of vocal cord mobility prior to performing anterior cervical fusion.  Patient apparently has had previous cervical surgery anteriorly as well as posteriorly because of chronic disc problems.  She is having further problems with numbness of the right arm and hand and is scheduled to have repeat cervical surgery with Dr.Brooks.. She is presently having no difficulty swallowing.  No hoarseness.  She does not smoke.  Past Medical History:  Diagnosis Date  . Anemia   . Cervical neuritis   . Chronic kidney disease    stage 3  . Dyslipidemia   . GERD (gastroesophageal reflux disease)   . History of transient ischemic attack (TIA)   . HTN (hypertension)   . Hyperlipidemia   . Migraine   . Migraine headache   . Occipital neuralgia    Left  . Pulmonary embolism ([place]) 2017  . Renal insufficiency   . Stroke [facility]) 10/2013   Past Surgical History:  Procedure Laterality Date  . ABDOMINAL HYSTERECTOMY    . BILIARY STENT PLACEMENT N/A 12/12/2015   Procedure: BILIARY STENT PLACEMENT;  Surgeon: Doran Stabler, MD;  Location: [place] ENDOSCOPY;  Service: Endoscopy;  Laterality: N/A;  . BLADDER REPAIR    . CARPAL TUNNEL RELEASE    . CERVICAL FUSION    . CERVICAL SPINE SURGERY    . CHOLECYSTECTOMY N/A 12/06/2015   Procedure: LAPAROSCOPIC CHOLECYSTECTOMY WITH  INTRAOPERATIVE CHOLANGIOGRAM;  Surgeon: Stark Klein, MD;  Location: [place];  Service: General;  Laterality: N/A;  . ELBOW SURGERY    . ERCP N/A 12/12/2015   Procedure: ENDOSCOPIC RETROGRADE CHOLANGIOPANCREATOGRAPHY (ERCP);  Surgeon: Doran Stabler, MD;  Location[facility] ENDOSCOPY;  Service: Endoscopy;  Laterality: N/A;  . ESOPHAGOGASTRODUODENOSCOPY N/A 01/28/2016   Procedure: ESOPHAGOGASTRODUODENOSCOPY (EGD) Biliary STENT removal;  Surgeon: Doran Stabler, MD;  [facility] ENDOS[facility];  Service: Gastroenterology;  Laterality: N/A;  . IR GENERIC  HISTORICAL  12/17/2015   IR US GUIDE BX ASP/DRAIN 12/17/2015 [facility]  . IR GENERIC HISTORICAL  12/17/2015   IR GUIDED DRAIN W CATHETER PLACEMENT 12/17/2015 [facility]  . IR GENERIC HISTORICAL  12/17/2015   IR SINUS/FIST TUBE CHK-NON GI 12/17/2015 [facility]  . KNEE SURGERY    . LUNG SURGERY    . TEE WITHOUT CARDIOVERSION N/A 12/19/2015   Procedure: TRANSESOPHAGEAL ECHOCARDIOGRAM (TEE);  Surgeon: Josue Hector, MD;  Location: [facility] ENDOSCOPY;  Service: Cardiovascular;  Laterality: N/A;   Social History   Socioeconomic History  . Marital status: Married    Spouse name: [name]  . Number of children: 1  . Years of education: 12+  . Highest education level: High school graduate  Occupational History  . Occupation: ACTIVITY DIRECTOR    Employer: [employer]  . Occupation: CNA  Tobacco Use  . Smoking status: Former Smoker    Packs/day: 0.30    Types: Cigarettes    Quit date: 11/01/2011    Years since quitting: 8.5  . Smokeless tobacco: Never Used  Vaping Use  . Vaping Use: Never used  Substance and Sexual Activity  . Alcohol use: No    Alcohol/week: 0.0 standard drinks  . Drug use: No  . Sexual activity: Yes    Partners: Male    Birth control/protection: Surgical    Comment: 1 sexual partner in last 12 months: married  Other Topics Concern  . Not on file  Social History Narrative   **  Merged History Encounter **       Patient is married ([name]). Patient has one child and grandchild. Her sons name is Shanon Brow. Patient sees them often. 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 Patient spends 6-10 hours with her 6 days a week.     Patient has a college education. Patient is very active in her church.  Hobbies- outside crafts and church choir.   Social Determinants of Health   Financial Resource Strain: Not on file  Food Insecurity: Not on file  Transportation Needs: Not on file  Physical Activity: Not on file  Stress:  Not on file  Social Connections: Not on file   Family History  Problem Relation Age of Onset  . Cancer Mother 3       breast  . Alzheimer's disease Mother   . Prostate cancer Father   . Cancer Brother   . Hyperlipidemia Brother   . Lung cancer Father   . Dementia Maternal Grandmother   . Cancer Maternal Grandfather   . Cancer Brother   . Hyperlipidemia Brother   . Dementia Brother        frontotemporal dementia   Allergies  Allergen Reactions  . Shellfish Allergy Anaphylaxis    All seafood  . Iodine   . Statins Other (See Comments)    Arthralgias (severe) with atorvastatin, mild arthralgias with rosuvastatin but willing to continue taking it   Prior to Admission medications   Medication Sig Start Date End Date Taking? Authorizing Provider  acetaminophen (TYLENOL) 325 MG tablet Take 2 tablets (650 mg total) by mouth 4 (four) times daily -  with meals and at bedtime. 05/27/19   Love, Ivan Anchors, PA-C  albuterol (VENTOLIN HFA) 108 (90 Base) MCG/ACT inhaler Inhale 2 puffs into the lungs every 6 (six) hours as needed for wheezing or shortness of breath. 01/24/20   Hunsucker, Bonna Gains, MD  calcium carbonate (OS-CAL - DOSED IN MG OF ELEMENTAL CALCIUM) 1250 (500 Ca) MG tablet Take 1 tablet (500 mg of elemental calcium total) by mouth daily with breakfast. 05/23/19   Lattie Haw, MD  cetirizine (ZYRTEC) 10 MG tablet Take 10 mg by mouth daily with breakfast.    [provider]  clopidogrel (PLAVIX) 75 MG tablet TAKE 1 TABLET(75 MG) BY MOUTH DAILY 07/11/19   [name] L, DO  diclofenac Sodium (VOLTAREN) 1 % GEL diclofenac 1 % topical gel   2 g by topical route. 05/27/19   [provider]  doxepin (SINEQUAN) 100 MG capsule Take by mouth. 01/17/20   [provider]  ELDERBERRY PO Take 1 tablet by mouth daily with breakfast.    [provider]  ergocalciferol (VITAMIN D2) 1.25 MG (50000 UT) capsule Take by mouth.    [provider]  ezetimibe  (ZETIA) 10 MG tablet ezetimibe 10 mg tablet   10 mg by oral route.    [provider]  fluticasone (FLONASE) 50 MCG/ACT nasal spray Place 1 spray into both nostrils daily. 05/28/19   Love, Ivan Anchors, PA-C  furosemide (LASIX) 40 MG tablet Take 40 mg by mouth daily. 06/23/19   [provider]  gabapentin (NEURONTIN) 300 MG capsule Take by mouth.    [provider]  Multiple Vitamins-Minerals (HAIR/SKIN/NAILS/BIOTIN) TABS Take 2 tablets by mouth daily with breakfast.    [provider]  ondansetron (ZOFRAN) 4 MG tablet Take by mouth.    [provider]  pantoprazole (PROTONIX) 40  MG tablet TAKE 1 TABLET(40 MG) BY MOUTH DAILY WITH BREAKFAST 02/07/20   Skeet Latch, MD  polyethylene glycol (MIRALAX / GLYCOLAX) 17 g packet Take 17 g by mouth daily as needed. 05/27/19   Love, Ivan Anchors, PA-C  ramipril (ALTACE) 2.5 MG capsule ramipril 2.5 mg capsule   2.5 mg by oral route. 05/27/19   [provider]  Rimegepant Sulfate (NURTEC) 75 MG TBDP Nurtec ODT 75 mg disintegrating tablet   75 mg by oral route. 12/09/18   [provider]  sertraline (ZOLOFT) 50 MG tablet TAKE 1 TABLET(50 MG) BY MOUTH DAILY 03/29/20   Ouida Sills, Chelsey L, DO  tiZANidine (ZANAFLEX) 2 MG tablet Take 1 tablet (2 mg total) by mouth 3 (three) times daily. 11/17/19   Lomax, Amy, NP  traMADol (ULTRAM) 50 MG tablet Take 1 tablet (50 mg total) by mouth every 8 (eight) hours as needed for moderate pain. 02/16/20   Charlett Blake, MD  traZODone (DESYREL) 50 MG tablet TAKE 1/2 TO 1 TABLET(25 TO 50 MG) BY MOUTH AT BEDTIME AS NEEDED FOR SLEEP 01/12/20   Anderson, Chelsey L, DO  zonisamide (ZONEGRAN) 100 MG capsule TAKE 1 CAPSULE(100 MG) BY MOUTH TWICE DAILY 11/29/19   Lomax, Amy, NP  zonisamide (ZONEGRAN) 25 MG capsule take 25 mg twice daily prn in addition to 100 mg twice daily. 07/11/19   Lomax, Amy, NP     Positive ROS: Otherwise negative  All other systems have been reviewed and  were otherwise negative with the exception of those mentioned in the HPI and as above.  Physical Exam: Constitutional: Alert, well-appearing, no acute distress Ears: External ears without lesions or tenderness. Ear canals are clear bilaterally with intact, clear TMs.  Nasal: External nose without lesions. Septum slightly deviated to the right. Clear nasal passages otherwise. Oral: Lips and gums without lesions. Tongue and palate mucosa without lesions. Posterior oropharynx clear. Fiberoptic laryngoscopy was performed to the left nostril.  The nasopharynx was clear.  The base of tongue vallecula and epiglottis were normal.  On evaluation of vocal cords vocal cords had symmetric mobility with no weakness on either side.  Vocal cords were clear to evaluation with perhaps mild edema. Neck: No palpable adenopathy or masses Respiratory: Breathing comfortably  Skin: No facial/neck lesions or rash noted.  Laryngoscopy  Date/Time: 05/09/2020 10:52 AM Performed by: Rozetta Nunnery, MD Authorized by: Rozetta Nunnery, MD   Consent:    Consent obtained:  Verbal   Consent given by:  Patient Procedure details:    Indications: direct visualization of the upper aerodigestive tract     Medication:  Afrin   Instrument: flexible fiberoptic laryngoscope     Scope location: left nare   Sinus:    Left nasopharynx: normal   Mouth:    Oropharynx: normal     Base of tongue: normal     Epiglottis: normal   Throat:    True vocal cords: normal   Comments:     Vocal cords were clear bilaterally.  Vocal cords had normal mobility bilaterally.    Assessment: Normal vocal cord examination and mobility bilaterally. The anterior cervical fusion approach should be safe on either side.  Plan: Reviewed with patient concerning normal laryngeal examination and normal vocal cord mobility bilaterally. She will follow-up as needed.   Radene Journey, MD   CC:

## 2020-05-11 NOTE — Telephone Encounter (Signed)
Left VM requesting call back 05/11/20 at 11:45AM.   Loel Dubonnet, NP

## 2020-05-15 ENCOUNTER — Ambulatory Visit: Payer: Self-pay | Admitting: Orthopedic Surgery

## 2020-05-15 NOTE — H&P (Signed)
Subjective:   The patient is here today for a pre-operative History and Physical. They are scheduled for ACDF C3-4 on 05-24-20 with Dr. Rolena Infante at [facility].  Patient Active Problem List   Diagnosis Date Noted  . Insomnia 01/03/2020  . Healthcare maintenance 01/03/2020  . Flushing 06/05/2019  . Hypotension 06/03/2019  . Pain of left hip joint 05/31/2019  . Multiple pelvic fractures ([place]) 05/23/2019  . Muscular deconditioning   . Frequent falls   . Nondisplaced fracture of anterior wall of left acetabulum   . Fracture of multiple pubic rami, left 05/14/2019  . Chronic cough 01/05/2019  . Fatigue 11/04/2018  . Exposure to COVID-19 virus 10/05/2018  . Pre-operative clearance 04/21/2018  . PVD (peripheral vascular disease) ([place]) 04/20/2018  . CRI (chronic renal insufficiency), stage 3 (moderate) ([place]) 04/20/2018  . Spinal stenosis of lumbar region 04/14/2018  . History of cerebrovascular accident 04/14/2018  . Headache disorder 04/14/2018  . Degeneration of lumbar intervertebral disc 04/14/2018  . Anxiety 04/14/2018  . Pyelonephritis 04/05/2018  . Complicated UTI (urinary tract infection) 04/04/2018  . Plantar fasciitis 01/10/2018  . Chronic anxiety 01/08/2018  . Gastroesophageal reflux disease 01/08/2018  . Menopausal syndrome 01/08/2018  . Leg swelling 08/24/2017  . Pulmonary embolism ([place])   . Coronary artery calcification   . Dyspnea 07/26/2017  . Osteoarthritis of knee 04/08/2017  . Status post cholecystectomy   . Hyperlipidemia LDL goal <70 04/02/2015  . Chronic migraine without aura, with status migrainosus 09/26/2014  . Migraine variant 10/10/2013  . TIA (transient ischemic attack) 10/10/2013  . Essential hypertension 07/18/2013  . 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 Neuralgia, neuritis, and radiculitis, unspecified 02/04/2012  . Myofascial muscle pain 07/15/2011   Past Medical History:  Diagnosis Date  . Anemia   . Cervical neuritis   .  Chronic kidney disease    stage 3  . Dyslipidemia   . GERD (gastroesophageal reflux disease)   . History of transient ischemic attack (TIA)   . HTN (hypertension)   . Hyperlipidemia   . Migraine   . Migraine headache   . Occipital neuralgia    Left  . Pulmonary embolism ([place]) 2017  . Renal insufficiency   . Stroke [facility]) 10/2013    Past Surgical History:  Procedure Laterality Date  . ABDOMINAL HYSTERECTOMY    . BILIARY STENT PLACEMENT N/A 12/12/2015   Procedure: BILIARY STENT PLACEMENT;  Surgeon: Doran Stabler, MD;  Location: [place] ENDOSCOPY;  Service: Endoscopy;  Laterality: N/A;  . BLADDER REPAIR    . CARPAL TUNNEL RELEASE    . CERVICAL FUSION    . CERVICAL SPINE SURGERY    . CHOLECYSTECTOMY N/A 12/06/2015   Procedure: LAPAROSCOPIC CHOLECYSTECTOMY WITH  INTRAOPERATIVE CHOLANGIOGRAM;  Surgeon: Stark Klein, MD;  Location: [place];  Service: General;  Laterality: N/A;  . ELBOW SURGERY    . ERCP N/A 12/12/2015   Procedure: ENDOSCOPIC RETROGRADE CHOLANGIOPANCREATOGRAPHY (ERCP);  Surgeon: Doran Stabler, MD;  Location: [facility] ENDOSCOPY;  Service: Endoscopy;  Laterality: N/A;  . ESOPHAGOGASTRODUODENOSCOPY N/A 01/28/2016   Procedure: ESOPHAGOGASTRODUODENOSCOPY (EGD) Biliary STENT removal;  Surgeon: Doran Stabler, MD;  Location: [facility] ENDOSCOPY;  Service: Gastroenterology;  Laterality: N/A;  . IR GENERIC HISTORICAL  12/17/2015   IR US GUIDE BX ASP/DRAIN 12/17/2015 [facility]  . IR GENERIC HISTORICAL  12/17/2015   IR GUIDED DRAIN W CATHETER PLACEMENT 12/17/2015 [facility]  . IR GENERIC HISTORICAL  12/17/2015   IR SINUS/FIST TUBE CHK-NON GI 12/17/2015 [facility]  . KNEE SURGERY    . LUNG SURGERY    . TEE WITHOUT CARDIOVERSION N/A 12/19/2015   Procedure: TRANSESOPHAGEAL ECHOCARDIOGRAM (TEE);  Surgeon: Josue Hector, MD;  Location: [facility] ENDOSCOPY;  Service: Cardiovascular;  Laterality: N/A;    Current Outpatient Medications  Medication Sig Dispense Refill Last Dose  . acetaminophen  (TYLENOL) 325 MG tablet Take 2 tablets (650 mg total) by mouth 4 (four) times daily -  with meals and at bedtime.     Marland Kitchen albuterol (VENTOLIN HFA) 108 (90 Base) MCG/ACT inhaler Inhale 2 puffs into the lungs every 6 (six) hours as needed for wheezing or shortness of breath. 8 g 6   . calcium carbonate (OS-CAL - DOSED IN MG OF ELEMENTAL CALCIUM) 1250 (500 Ca) MG tablet Take 1 tablet (500 mg of elemental calcium total) by mouth daily with breakfast.     . cetirizine (ZYRTEC) 10 MG tablet Take 10 mg by mouth daily with breakfast.     . clopidogrel (PLAVIX) 75 MG tablet TAKE 1 TABLET(75 MG) BY MOUTH DAILY 90 tablet 0   . diclofenac Sodium (VOLTAREN) 1 % GEL diclofenac 1 % topical gel   2 g by topical route.     . doxepin (SINEQUAN) 100 MG capsule Take by mouth.     . ELDERBERRY PO Take 1 tablet by mouth daily with breakfast.     . ergocalciferol (VITAMIN D2) 1.25 MG (50000 UT) capsule Take by mouth.     . ezetimibe (ZETIA) 10 MG tablet ezetimibe 10 mg tablet   10 mg by oral route.     . fluticasone (FLONASE) 50 MCG/ACT nasal spray Place 1 spray into both nostrils daily.  2   . furosemide (LASIX) 40 MG tablet Take 40 mg by mouth daily.     Marland Kitchen gabapentin (NEURONTIN) 300 MG capsule Take by mouth.     . Multiple Vitamins-Minerals (HAIR/SKIN/NAILS/BIOTIN) TABS Take 2 tablets by mouth daily with breakfast.     . ondansetron (ZOFRAN) 4 MG tablet Take by mouth.     . pantoprazole (PROTONIX) 40 MG tablet TAKE 1 TABLET(40 MG) BY MOUTH DAILY WITH BREAKFAST 30 tablet 10   . polyethylene glycol (MIRALAX / GLYCOLAX) 17 g packet Take 17 g by mouth daily as needed. 14 each 0   . ramipril (ALTACE) 2.5 MG capsule ramipril 2.5 mg capsule   2.5 mg by oral route.     . Rimegepant Sulfate (NURTEC) 75 MG TBDP Nurtec ODT 75 mg disintegrating tablet   75 mg by oral route.     . sertraline (ZOLOFT) 50 MG tablet TAKE 1 TABLET(50 MG) BY MOUTH DAILY 60 tablet 1   . tiZANidine (ZANAFLEX) 2 MG tablet Take 1 tablet (2 mg total) by  mouth 3 (three) times daily. 270 tablet 1   . traMADol (ULTRAM) 50 MG tablet Take 1 tablet (50 mg total) by mouth every 8 (eight) hours as needed for moderate pain. 90 tablet 5   . traZODone (DESYREL) 50 MG tablet TAKE 1/2 TO 1 TABLET(25 TO 50 MG) BY MOUTH AT BEDTIME AS NEEDED FOR SLEEP 90 tablet 1   . zonisamide (ZONEGRAN) 100 MG capsule TAKE 1 CAPSULE(100 MG) BY MOUTH TWICE DAILY 60 capsule 5   . zonisamide (ZONEGRAN) 25 MG capsule take 25 mg twice daily prn in addition to 100 mg twice daily. 180 capsule 1    No current facility-administered medications for this visit.   Allergies  Allergen Reactions  . Shellfish Allergy Anaphylaxis  All seafood  . Iodine   . Statins Other (See Comments)    Arthralgias (severe) with atorvastatin, mild arthralgias with rosuvastatin but willing to continue taking it    Social History   Tobacco Use  . Smoking status: Former Smoker    Packs/day: 0.30    Types: Cigarettes    Quit date: 11/01/2011    Years since quitting: 8.5  . Smokeless tobacco: Never Used  Substance Use Topics  . Alcohol use: No    Alcohol/week: 0.0 standard drinks    Family History  Problem Relation Age of Onset  . Cancer Mother 46       breast  . Alzheimer's disease Mother   . Prostate cancer Father   . Cancer Brother   . Hyperlipidemia Brother   . Lung cancer Father   . Dementia Maternal Grandmother   . Cancer Maternal Grandfather   . Cancer Brother   . Hyperlipidemia Brother   . Dementia Brother        frontotemporal dementia    Review of Systems Pertinent items are noted in HPI.  Objective:   Vitals: Ht: 5 ft 5 in 05/14/2020 08:28 am BP: 140/80 R arm 05/14/2020 08:33 am Pulse: 80 bpm 05/14/2020 08:31 am  General: [name], NAD  Heart: RRR, no rubs, murmers, or gallops  Lungs: CTAB  Abdomen: BSx4, non-tender, non-distended, no rebound tenderness or loss of bladder/bowel control  Clinical exam: Bilateral upper extremity 4/5 deltoid and biceps strength.  Positive Hoffman test, positive Babinski test. Patient has difficulty with heel toe ambulation maintaining her balance. Positive gait disturbance.  Cervical MRI: completed on 05/01/20 with was reviewed with the patient. It was completed at [facility]; I have independently reviewed the images as well as the radiology report. T2 hyperintensity at the C3-4 level compatible with spondylitic myelopathy. Severe C3-4 stenosis with loss of normal CSF signal and cord signal changes. Moderate foraminal stenosis C2-3 and significant facet arthropathy. Prior ACDF at C5-6 with mild canal stenosis. Significant foraminal narrowing is noted at that level. Prior surgical intervention at C5-6 with ongoing neuroforaminal narrowing causing impingement on the C6 nerve root.  Assessment:    Lyndel Pleasure returns today for follow-up of her MRI. 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 She has clinical signs and symptoms of myelopathy. I have gone over the pathology as well as the imaging studies with her and all of her questions were encouraged and addressed. At this point I am recommending a revision ACDF at C3-4 to address the myelopathy.  Plan:    I have gone over the surgical procedure including the risks, benefits, and alternatives the goal of surgery is to prevent worsening of her symptoms not to improve them. The natural history of myelopathy is 1 of progression which will ultimately lead to lower extremity weakness that will inhibit the ability to ambulate and she will therefore be wheelchair bound. She is already demonstrated balance changes and altered gait pattern due to the myelopathy as well as upper extremity weakness and loss in fine motor control. Risks and benefits of surgery were discussed with the patient. These include:  Infection, bleeding, death, stroke, paralysis, ongoing or worse pain, need for additional surgery, nonunion, leak of spinal fluid, adjacent segment degeneration requiring additional fusion surgery. Pseudoarthrosis (nonunion)requiring supplemental posterior fixation. Throat pain, swallowing difficulties, hoarseness or change  in voice.  We have also discussed the post-operative recovery period to include: bathing/showering restrictions, wound healing, activity (and driving) restrictions, medications/pain mangement.  We have also discussed post-operative redflags to include: signs and symptoms of postoperative infection, DVT/PE.  Follow-up: 2 weeks postop

## 2020-05-15 NOTE — H&P (Deleted)
  The note originally documented on this encounter has been moved the the encounter in which it belongs.  

## 2020-05-15 NOTE — Telephone Encounter (Signed)
    [name] with [facility], she said they did reached out to pcp about plavix but pt still need to be cleared for the cardiac stand point. She gave her number if there's any questions [phone]

## 2020-05-16 NOTE — Telephone Encounter (Signed)
Dr. Oval Linsey This patient had moderate CAD on CT chest. Follow up [place] "was likely negative for ischemia.  There was a concern over mild anterior apical ischemia, though this is likely breast attenuation artifact."  You recommended a coronary CTA, but this was not completed.  We are now asked for cardiac clearance for cervical fusion. I have not been able to reach her to determine her activity level. Do you recommend coronary CT prior to surgery for risk stratification?  Thank you  Angie

## 2020-05-18 ENCOUNTER — Telehealth: Payer: Self-pay | Admitting: *Deleted

## 2020-05-18 DIAGNOSIS — M5412 Radiculopathy, cervical region: Secondary | ICD-10-CM | POA: Diagnosis not present

## 2020-05-18 DIAGNOSIS — M542 Cervicalgia: Secondary | ICD-10-CM | POA: Diagnosis not present

## 2020-05-18 LAB — TOXASSURE SELECT,+ANTIDEPR,UR

## 2020-05-18 NOTE — Telephone Encounter (Signed)
Urine drug screen for this encounter is consistent for prescribed medication 

## 2020-05-21 ENCOUNTER — Ambulatory Visit: Payer: Self-pay | Admitting: Orthopedic Surgery

## 2020-05-21 ENCOUNTER — Telehealth: Payer: Self-pay | Admitting: Family Medicine

## 2020-05-21 MED ORDER — TIZANIDINE HCL 2 MG PO TABS: 2.0000 mg | ORAL_TABLET | Freq: Three times a day (TID) | ORAL | 1 refills | Status: DC

## 2020-05-21 MED ORDER — DOXEPIN HCL 100 MG PO CAPS: 200.0000 mg | ORAL_CAPSULE | Freq: Every day | ORAL | 1 refills | Status: DC

## 2020-05-21 NOTE — Telephone Encounter (Signed)
Pt request refills tiZANidine (ZANAFLEX) 2 MG tablet and doxepin (SINEQUAN) 100 MG capsule at [facility] #[id number]

## 2020-05-21 NOTE — Telephone Encounter (Signed)
Called pt and she is taking doxepin 200mg  po qhs (2 of 100mg  caps).  I refilled for 6 months.

## 2020-05-22 ENCOUNTER — Ambulatory Visit (HOSPITAL_COMMUNITY)
Admission: RE | Admit: 2020-05-22 | Discharge: 2020-05-22 | Disposition: A | Discharge status: Home / Self Care | Payer: Medicare Other | Source: Ambulatory Visit | Attending: Family Medicine | Admitting: Family Medicine

## 2020-05-22 ENCOUNTER — Other Ambulatory Visit (HOSPITAL_COMMUNITY): Payer: Medicare Other

## 2020-05-22 ENCOUNTER — Encounter: Payer: Self-pay | Admitting: Physician Assistant

## 2020-05-22 ENCOUNTER — Other Ambulatory Visit: Payer: Self-pay

## 2020-05-22 ENCOUNTER — Ambulatory Visit (INDEPENDENT_AMBULATORY_CARE_PROVIDER_SITE_OTHER): Payer: Medicare Other | Admitting: Physician Assistant

## 2020-05-22 ENCOUNTER — Ambulatory Visit (INDEPENDENT_AMBULATORY_CARE_PROVIDER_SITE_OTHER): Payer: Medicare Other | Admitting: Student in an Organized Health Care Education/Training Program

## 2020-05-22 VITALS — BP 138/80 | HR 120 | Wt 175.4 lb

## 2020-05-22 VITALS — BP 148/88 | HR 90 | Ht 65.0 in | Wt 177.0 lb

## 2020-05-22 DIAGNOSIS — I10 Essential (primary) hypertension: Secondary | ICD-10-CM | POA: Diagnosis not present

## 2020-05-22 DIAGNOSIS — M79631 Pain in right forearm: Secondary | ICD-10-CM | POA: Diagnosis not present

## 2020-05-22 DIAGNOSIS — M25511 Pain in right shoulder: Secondary | ICD-10-CM | POA: Diagnosis not present

## 2020-05-22 DIAGNOSIS — Z01818 Encounter for other preprocedural examination: Secondary | ICD-10-CM

## 2020-05-22 DIAGNOSIS — E785 Hyperlipidemia, unspecified: Secondary | ICD-10-CM

## 2020-05-22 DIAGNOSIS — I2584 Coronary atherosclerosis due to calcified coronary lesion: Secondary | ICD-10-CM

## 2020-05-22 DIAGNOSIS — I2510 Atherosclerotic heart disease of native coronary artery without angina pectoris: Secondary | ICD-10-CM | POA: Diagnosis not present

## 2020-05-22 DIAGNOSIS — Z20822 Contact with and (suspected) exposure to covid-19: Secondary | ICD-10-CM

## 2020-05-22 DIAGNOSIS — N183 Chronic kidney disease, stage 3 unspecified: Secondary | ICD-10-CM | POA: Diagnosis not present

## 2020-05-22 DIAGNOSIS — Z8673 Personal history of transient ischemic attack (TIA), and cerebral infarction without residual deficits: Secondary | ICD-10-CM

## 2020-05-22 DIAGNOSIS — Z01812 Encounter for preprocedural laboratory examination: Secondary | ICD-10-CM | POA: Diagnosis not present

## 2020-05-22 DIAGNOSIS — R000 Tachycardia, unspecified: Secondary | ICD-10-CM | POA: Diagnosis not present

## 2020-05-22 DIAGNOSIS — Z01810 Encounter for preprocedural cardiovascular examination: Secondary | ICD-10-CM | POA: Insufficient documentation

## 2020-05-22 DIAGNOSIS — Z130 Encounter for screening for diseases of the blood and blood-forming organs and certain disorders involving the immune mechanism: Secondary | ICD-10-CM | POA: Diagnosis not present

## 2020-05-22 DIAGNOSIS — Z862 Personal history of diseases of the blood and blood-forming organs and certain disorders involving the immune mechanism: Secondary | ICD-10-CM | POA: Diagnosis not present

## 2020-05-22 LAB — POCT UA - MICROSCOPIC ONLY

## 2020-05-22 LAB — POCT URINALYSIS DIP (MANUAL ENTRY)
Bilirubin, UA: NEGATIVE
Glucose, UA: NEGATIVE mg/dL
Ketones, POC UA: NEGATIVE mg/dL
Leukocytes, UA: NEGATIVE
Nitrite, UA: POSITIVE — AB
Protein Ur, POC: NEGATIVE mg/dL
Spec Grav, UA: 1.025 (ref 1.010–1.025)
Urobilinogen, UA: 0.2 E.U./dL
pH, UA: 5.5 (ref 5.0–8.0)

## 2020-05-22 MED ORDER — AMLODIPINE BESYLATE 10 MG PO TABS: 10.0000 mg | ORAL_TABLET | Freq: Every day | ORAL | 3 refills | Status: DC

## 2020-05-22 NOTE — Assessment & Plan Note (Addendum)
Already seen and approved for surgery by cardiology today She discontinued her plavix March 10th. Surgery planned 3/17. Obtaining pre-op labs today to monitor kidney function, anemia, covid screen, etc. Will send in risk stratification to surgeon when resulted.

## 2020-05-22 NOTE — Patient Instructions (Signed)
Medication Instructions:   INCREASE Amlodipine to 10 mg daily  *If you need a refill on your cardiac medications before your next appointment, please call your pharmacy*  Lab Work: NONE ordered at this time of appointment   If you have labs (blood work) drawn today and your tests are completely normal, you will receive your results only by: Marland Kitchen MyChart Message (if you have MyChart) OR . A paper copy in the mail If you have any lab test that is abnormal or we need to change your treatment, we will call you to review the results.  Testing/Procedures: NONE ordered at this time of appointment   Follow-Up: At [facility], you and your health needs are our priority.  As part of our continuing mission to provide you with exceptional heart care, we have created designated Provider Care Teams.  These Care Teams include your primary Cardiologist (physician) and Advanced Practice Providers (APPs -  Physician Assistants and Nurse Practitioners) who all work together to provide you with the care you need, when you need it.  Your next appointment:   3-4 month(s)  The format for your next appointment:   In Person  Provider:   Skeet Latch, MD  Other Instructions

## 2020-05-22 NOTE — Assessment & Plan Note (Addendum)
HR 120 on presentation and improved to 108 when rechecked after resting.  Was 15 and regular this am at cardiology appointment. Patient is asymptomatic and attributes to her pain. She states that her orthopedist is going to call in a pain medication for her after seeing them this morning. Offered that she can take her home tramadol PRN Repeat EKG now is poor quality but appears to be sinus tachycardia.  Irregularity heard on exam likely PAC

## 2020-05-22 NOTE — Patient Instructions (Signed)
It was a pleasure to see you today!  To summarize our discussion for this visit:  Your heart rate was fast today in your visit but since you have no symptoms with it and your ECG was negative for STEMI, we can assume it is likely related to your pain.   Please follow up with cardiology if you develop any symptoms including flutter, palpitations, or feel like you will pass out.   Follow up with ortho for the pain medication. It is ok to take some of your tramadol if needed in the mean time.   Some additional health maintenance measures we should update are: Health Maintenance Due  Topic Date Due  . COLONOSCOPY (Pts 45-61yrs Insurance coverage will need to be confirmed)  Never done  . DEXA SCAN  Never done  . PNA vac Low Risk Adult (2 of 2 - PPSV23) 07/31/2018  . COVID-19 Vaccine (3 - Booster for Moderna series) 03/18/2020  .    Please return to our clinic to see me in about 2 months for physical.  Call the clinic at [phone] if your symptoms worsen or you have any concerns.   Thank you for allowing me to take part in your care,  Dr. [name]

## 2020-05-22 NOTE — Progress Notes (Signed)
   SUBJECTIVE:   CHIEF COMPLAINT / HPI: surgical clearance  Patient presents today for surgical clearance for procedure to take place on 3/17.  She had appointment with cardiology this morning who already stratified her as low risk for surgery. Other than her chronic pain and the pain from recent fall, for which she saw ortho this morning, she feels well. ECG at cardiology this am had HR 90.   PERTINENT  PMH / PSH: TIA, PE, migraine, HTN, CAD, GERD, OA, hip fx, CKD, HLD, anemia  OBJECTIVE:   BP 138/80   Pulse (!) 120   Wt 175 lb 6.4 oz (79.6 kg)   SpO2 95%   BMI 29.19 kg/m   Repeat HR was 108 and had intermittent missed beat. Physical Exam Vitals and nursing note reviewed. Exam conducted with a chaperone present.  Constitutional:      Appearance: Normal appearance.  HENT:     Head: Normocephalic.  Cardiovascular:     Rate and Rhythm: Tachycardia present. Rhythm irregular.     Pulses: Normal pulses.     Heart sounds: No murmur heard.   Pulmonary:     Effort: Pulmonary effort is normal.     Breath sounds: Normal breath sounds.  Abdominal:     General: Abdomen is flat.     Palpations: Abdomen is soft.  Musculoskeletal:     Cervical back: No tenderness.  Skin:    General: Skin is warm and dry.     Capillary Refill: Capillary refill takes less than 2 seconds.  Neurological:     Mental Status: She is alert and oriented to person, place, and time.  Psychiatric:        Mood and Affect: Mood normal.    ASSESSMENT/PLAN:   Tachycardia HR 120 on presentation and improved to 108 when rechecked after resting.  Was 22 and regular this am at cardiology appointment. Patient is asymptomatic and attributes to her pain. She states that her orthopedist is going to call in a pain medication for her after seeing them this morning. Offered that she can take her home tramadol PRN Repeat EKG now is poor quality but appears to be sinus tachycardia.  Irregularity heard on exam likely  [facility]  Preoperative clearance Already seen and approved for surgery by cardiology today She discontinued her plavix March 10th. Surgery planned 3/17. Obtaining pre-op labs today to monitor kidney function, anemia, covid screen, etc. Will send in risk stratification to surgeon when resulted.      [place]

## 2020-05-22 NOTE — Progress Notes (Unsigned)
Cardiology Office Note:    Date:  05/23/2020   ID:  [name] Courtney, DOB October 08, 1950, MRN [id number]  PCP:  [name] [place]  Cardiologist:  Courtney Latch, MD  Advanced Practice Provider:  No care team member to display Electrophysiologist:  None   Referring MD: Courtney Osmond, DO   Chief Complaint  Patient presents with  . Follow-up    Seen for Dr. Oval [name]    History of Present Illness:    [name] is a 70 y.o. female with a hx of asymptomatic coronary calcification, L subclavian artery stenosis, hypertension, hyperlipidemia, CKD stage III and history of CVA. She had a 30-years smoking history in the past. She also had remote lung puncture injury that required thoracentesis and drain placement.  Patient was admitted in May 2019 with shortness of breath.  Lower extremity Doppler was negative for DVT.  VQ scan showed perfusion defect that was deemed intermediate probability. There was no evidence of RV strain on echocardiogram.  She was treated with Eliquis for 6 months. Myoview in July 2019 revealed EF 66%, no ischemia.  Repeat Myoview in October 2021 showed EF 66%, apical anterior and apical defect likely due to breast attenuation.  Due to persistent dyspnea, she was referred to pulmonology service for pulmonary function test which revealed reduced lung volume, increased FEV1/FVC ratio, diffusion defect suggestive of interstitial process.  She was evaluated by pulmonology service and felt this is likely related to deconditioning as well as reactive airway disease.  She was treated with both Breo and albuterol.  She was last seen by Dr. Oval [name] on 03/16/2020, blood pressure was borderline elevated at the time, amlodipine was increased to 5 mg daily.  She returns today for blood pressure follow-up and preoperative clearance given the need for anterior cervical discectomy and fusion of C3-C4.  Patient presents today for preoperative  clearance.  She denies any recent chest pain or shortness of breath.  She does have some balance issue due to lower back pain however able to walk a fair distance without any issue.  I discussed her case with Dr. Oval [name], her primary cardiologist.  Given lack of any anginal symptoms, we do not recommend coronary CT anymore.  She is cleared from cardiac perspective to proceed with back surgery.  She may hold Plavix for 7 days prior to her surgery and restart as soon as possible after her surgery at the surgeon's discretion.   Past Medical History:  Diagnosis Date  . Anemia   . Cervical neuritis   . Chronic kidney disease    stage 3  . Dyslipidemia   . GERD (gastroesophageal reflux disease)   . History of transient ischemic attack (TIA)   . HTN (hypertension)   . Hyperlipidemia   . Migraine   . Migraine headache   . Occipital neuralgia    Left  . Pulmonary embolism ([place]) 2017  . Renal insufficiency   . Stroke [facility]) 10/2013    Past Surgical History:  Procedure Laterality Date  . ABDOMINAL HYSTERECTOMY    . BILIARY STENT PLACEMENT N/A 12/12/2015   Procedure: BILIARY STENT PLACEMENT;  Surgeon: Doran Stabler, MD;  Location: [facility] ENDOSCOPY;  Service: Endoscopy;  Laterality: N/A;  . BLADDER REPAIR    . CARPAL TUNNEL RELEASE    . CERVICAL FUSION    . CERVICAL SPINE SURGERY    . CHOLECYSTECTOMY N/A 12/06/2015   Procedure: LAPAROSCOPIC CHOLECYSTECTOMY WITH  INTRAOPERATIVE CHOLANGIOGRAM;  Surgeon: Stark Klein, MD;  Location: [place];  Service: General;  Laterality: N/A;  . ELBOW SURGERY    . ERCP N/A 12/12/2015   Procedure: ENDOSCOPIC RETROGRADE CHOLANGIOPANCREATOGRAPHY (ERCP);  Surgeon: Doran Stabler, MD;  Location[facility] ENDOSCOPY;  Service: Endoscopy;  Laterality: N/A;  . ESOPHAGOGASTRODUODENOSCOPY N/A 01/28/2016   Procedure: ESOPHAGOGASTRODUODENOSCOPY (EGD) Biliary STENT removal;  Surgeon: Doran Stabler, MD;  Location: [facility] ENDOSCOPY;  Service: Gastroenterology;  Laterality: N/A;  .  IR GENERIC HISTORICAL  12/17/2015   IR US GUIDE BX ASP/DRAIN 12/17/2015 [facility]  . IR GENERIC HISTORICAL  12/17/2015   IR GUIDED DRAIN W CATHETER PLACEMENT 12/17/2015 [facility]  . IR GENERIC HISTORICAL  12/17/2015   IR SINUS/FIST TUBE CHK-NON GI 12/17/2015 [facility]  . KNEE SURGERY    . LUNG SURGERY    . TEE WITHOUT CARDIOVERSION N/A 12/19/2015   Procedure: TRANSESOPHAGEAL ECHOCARDIOGRAM (TEE);  Surgeon: Josue Hector, MD;  Location: [facility] ENDOSCOPY;  Service: Cardiovascular;  Laterality: N/A;    Current Medications: Current Meds  Medication Sig  . acetaminophen (TYLENOL) 500 MG tablet Take 500 mg by mouth every 6 (six) hours as needed for moderate pain.  Marland Kitchen albuterol (VENTOLIN HFA) 108 (90 Base) MCG/ACT inhaler Inhale 2 puffs into the lungs every 6 (six) hours as needed for wheezing or shortness of breath.  Marland Kitchen amLODipine (NORVASC) 10 MG tablet Take 1 tablet (10 mg total) by mouth daily.  . calcium carbonate (OS-CAL - DOSED IN MG OF ELEMENTAL CALCIUM) 1250 (500 Ca) MG tablet Take 1 tablet (500 mg of elemental calcium total) by mouth daily with breakfast.  . cetirizine (ZYRTEC) 10 MG tablet Take 10 mg by mouth daily with breakfast.  . clopidogrel (PLAVIX) 75 MG tablet TAKE 1 TABLET(75 MG) BY MOUTH DAILY (Patient taking differently: Take 75 mg by mouth daily.)  . doxepin (SINEQUAN) 100 MG capsule Take 2 capsules (200 mg total) by mouth at bedtime.  Marland Kitchen ELDERBERRY PO Take 1 tablet by mouth daily with breakfast.  . ergocalciferol (VITAMIN D2) 1.25 MG (50000 UT) capsule Take 50,000 Units by mouth every Friday.  . ezetimibe (ZETIA) 10 MG tablet Take 10 mg by mouth daily.  . fluticasone (FLONASE) 50 MCG/ACT nasal spray Place 1 spray into both nostrils daily. (Patient taking differently: Place 1 spray into both nostrils daily as needed for allergies.)  . furosemide (LASIX) 40 MG tablet Take 40 mg by mouth daily.  Marland Kitchen gabapentin (NEURONTIN) 300 MG capsule Take 600 mg by mouth 2 (two) times daily.   . Multiple Vitamins-Minerals (HAIR/SKIN/NAILS/BIOTIN) TABS Take 2 tablets by mouth daily with breakfast.  . ondansetron (ZOFRAN) 4 MG tablet Take 4 mg by mouth every 8 (eight) hours as needed for nausea or vomiting.  . pantoprazole (PROTONIX) 40 MG tablet TAKE 1 TABLET(40 MG) BY MOUTH DAILY WITH BREAKFAST (Patient taking differently: Take 40 mg by mouth daily.)  . sertraline (ZOLOFT) 50 MG tablet TAKE 1 TABLET(50 MG) BY MOUTH DAILY (Patient taking differently: Take 50 mg by mouth daily.)  . tiZANidine (ZANAFLEX) 2 MG tablet Take 1 tablet (2 mg total) by mouth 3 (three) times daily.  . traMADol (ULTRAM) 50 MG tablet Take 1 tablet (50 mg total) by mouth every 8 (eight) hours as needed for moderate pain.  . traZODone (DESYREL) 50 MG tablet TAKE 1/2 TO 1 TABLET(25 TO 50 MG) BY MOUTH AT BEDTIME AS NEEDED FOR SLEEP (Patient taking differently: Take 50 mg by mouth at bedtime as needed for sleep.)  .  zonisamide (ZONEGRAN) 100 MG capsule TAKE 1 CAPSULE(100 MG) BY MOUTH TWICE DAILY (Patient taking differently: Take 100 mg by mouth 2 (two) times daily. TAKE 1 CAPSULE(100 MG) BY MOUTH TWICE DAILY)  . zonisamide (ZONEGRAN) 25 MG capsule take 25 mg twice daily prn in addition to 100 mg twice daily. (Patient taking differently: Take 25 mg by mouth See admin instructions. take 25 mg twice daily prn in addition to 100 mg twice daily.)     Allergies:   Shellfish allergy, Iodine, and Statins   Social History   Socioeconomic History  . Marital status: Married    Spouse name: [name]  . Number of children: 1  . Years of education: 12+  . Highest education level: High school graduate  Occupational History  . Occupation: ACTIVITY DIRECTOR    Employer: [employer]  . Occupation: CNA  Tobacco Use  . Smoking status: Former Smoker    Packs/day: 0.30    Types: Cigarettes    Quit date: 11/01/2011    Years since quitting: 8.5  . Smokeless tobacco: Never Used  Vaping Use  . Vaping Use: Never used  Substance  and Sexual Activity  . Alcohol use: No    Alcohol/week: 0.0 standard drinks  . Drug use: No  . Sexual activity: Yes    Partners: Male    Birth control/protection: Surgical    Comment: 1 sexual partner in last 12 months: married  Other Topics Concern  . Not on file  Social History Narrative   ** Merged History Encounter **       Patient is married ([name]). Patient has one child and grandchild. Her sons name is Shanon Brow. Patient sees them often. 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 Patient spends 6-10 hours with her 6 days a week.     Patient has a college education. Patient is very active in her church.  Hobbies- outside crafts and church choir.   Social Determinants of Health   Financial Resource Strain: Not on file  Food Insecurity: Not on file  Transportation Needs: Not on file  Physical Activity: Not on file  Stress: Not on file  Social Connections: Not on file     Family History: The patient's family history includes Alzheimer's disease in her mother; Cancer in her brother, brother, and maternal grandfather; Cancer (age of onset: 62) in her mother; Dementia in her brother and maternal grandmother; Hyperlipidemia in her brother and brother; Lung cancer in her father; Prostate cancer in her father.  ROS:   Please see the history of present illness.     All other systems reviewed and are negative.  EKGs/Labs/Other Studies Reviewed:    The following studies were reviewed today:  Myoview 12/20/2019   The left ventricular ejection fraction is hyperdynamic (>65%).  Nuclear stress EF: 66%.  There was no ST segment deviation noted during stress.  Defect 1: There is a small defect of moderate severity present in the apical anterior and apex location.  This is a low risk study.   Low risk stress nuclear study with partially reversible distal anterior/apical defect likely related to shifting breast attenuation.  Cannot rule  out minimal ischemia.  Gated ejection fraction 66% with normal wall motion.   EKG:  EKG is ordered today.  The ekg ordered today demonstrates normal sinus rhythm without significant ST-T wave changes  Recent Labs: 07/31/2019: B Natriuretic Peptide 66.3; Magnesium 2.1 08/29/2019: ALT 18 05/22/2020: BUN 14; Creatinine,  Ser 1.13; Hemoglobin 13.6; Platelets 322; Potassium 3.9; Sodium 140  Recent Lipid Panel    Component Value Date/Time   CHOL 210 (H) 06/13/2019 0927   TRIG 224 (H) 06/13/2019 0927   HDL 48 06/13/2019 0927   CHOLHDL 4.4 06/13/2019 0927   CHOLHDL 2.2 07/13/2015 1056   VLDL 10 07/13/2015 1056   LDLCALC 123 (H) 06/13/2019 0927   LDLDIRECT 156 (H) 03/27/2017 0939     Risk Assessment/Calculations:       Physical Exam:    VS:  BP (!) 148/88   Pulse 90   Ht 5\' 5"  (1.651 m)   Wt 177 lb (80.3 kg)   SpO2 95%   BMI 29.45 kg/m     Wt Readings from Last 3 Encounters:  05/22/20 175 lb 6.4 oz (79.6 kg)  05/22/20 177 lb (80.3 kg)  05/08/20 182 lb 9.6 oz (82.8 kg)     GEN:  Well nourished, well developed in no acute distress HEENT: Normal NECK: No JVD; No carotid bruits LYMPHATICS: No lymphadenopathy CARDIAC: RRR, no murmurs, rubs, gallops RESPIRATORY:  Clear to auscultation without rales, wheezing or rhonchi  ABDOMEN: Soft, non-tender, non-distended MUSCULOSKELETAL:  No edema; No deformity  SKIN: Warm and dry NEUROLOGIC:  Alert and oriented x 3 PSYCHIATRIC:  Normal affect   ASSESSMENT:    1. Pre-op evaluation   2. Essential hypertension   3. Coronary artery calcification   4. Hyperlipidemia LDL goal <70   5. Stage 3 chronic kidney disease, unspecified whether stage 3a or 3b CKD ([place])   6. H/O: CVA (cerebrovascular accident)    PLAN:    In order of problems listed above:  1. Preoperative clearance: Upcoming neck surgery by Dr. Rolena Infante.  Patient denies any recent chest discomfort.  Previous Myoview obtained in October 2021 was low risk with partially  reversible distal anterior and apical defect likely related to shifting breast attenuation however cannot rule out minimal ischemia.  Dr. Oval [name] initially recommend a coronary CT, however decided to hold off given lack of symptom.  Talking with the patient today, she can walk a fair distance without any chest pain or worsening dyspnea.  I discussed the case with Dr. Oval [name] again, at this time we do not recommend any further work-up prior to surgery.  She is cleared to proceed with surgery after holding Plavix for 7 days.  She will need to restart Plavix as soon as possible after the procedure as the surgeon's discretion  2. Hypertension: Blood pressure elevated in the 120s to 140s range, will increase amlodipine to 10 mg daily.  Note amlodipine was previously removed by her pain doctor's office from her medication list, this is likely a mistake of this patient was still taking the 5 mg amlodipine prior to arrival today.  3. Coronary artery calcification: Low risk Myoview in October 2021  4. Hyperlipidemia: Intolerant of statins.  5. Stage III CKD: Stable  6. History of CVA: No recent recurrence.        Medication Adjustments/Labs and Tests Ordered: Current medicines are reviewed at length with the patient today.  Concerns regarding medicines are outlined above.  Orders Placed This Encounter  Procedures  . EKG 12-Lead   Meds ordered this encounter  Medications  . amLODipine (NORVASC) 10 MG tablet    Sig: Take 1 tablet (10 mg total) by mouth daily.    Dispense:  180 tablet    Refill:  3    Patient Instructions  Medication Instructions:   INCREASE Amlodipine to  10 mg daily  *If you need a refill on your cardiac medications before your next appointment, please call your pharmacy*  Lab Work: NONE ordered at this time of appointment   If you have labs (blood work) drawn today and your tests are completely normal, you will receive your results only by: Marland Kitchen MyChart Message (if you  have MyChart) OR . A paper copy in the mail If you have any lab test that is abnormal or we need to change your treatment, we will call you to review the results.  Testing/Procedures: NONE ordered at this time of appointment   Follow-Up: At [facility], you and your health needs are our priority.  As part of our continuing mission to provide you with exceptional heart care, we have created designated Provider Care Teams.  These Care Teams include your primary Cardiologist (physician) and Advanced Practice Providers (APPs -  Physician Assistants and Nurse Practitioners) who all work together to provide you with the care you need, when you need it.  Your next appointment:   3-4 month(s)  The format for your next appointment:   In Person  Provider:   Skeet Latch, MD  Other Instructions      Signed, Almyra Deforest, [place]  05/23/2020 9:39 AM    [place]

## 2020-05-23 ENCOUNTER — Telehealth: Payer: Self-pay | Admitting: Student in an Organized Health Care Education/Training Program

## 2020-05-23 ENCOUNTER — Other Ambulatory Visit: Payer: Self-pay

## 2020-05-23 ENCOUNTER — Encounter: Payer: Self-pay | Admitting: Physician Assistant

## 2020-05-23 ENCOUNTER — Encounter (HOSPITAL_COMMUNITY): Payer: Self-pay | Admitting: Orthopedic Surgery

## 2020-05-23 LAB — CBC
Hematocrit: 42 % (ref 34.0–46.6)
Hemoglobin: 13.6 g/dL (ref 11.1–15.9)
MCH: 27.1 pg (ref 26.6–33.0)
MCHC: 32.4 g/dL (ref 31.5–35.7)
MCV: 84 fL (ref 79–97)
Platelets: 322 10*3/uL (ref 150–450)
RBC: 5.01 x10E6/uL (ref 3.77–5.28)
RDW: 14.7 % (ref 11.7–15.4)
WBC: 11.5 10*3/uL — ABNORMAL HIGH (ref 3.4–10.8)

## 2020-05-23 LAB — BASIC METABOLIC PANEL
BUN/Creatinine Ratio: 12 (ref 12–28)
BUN: 14 mg/dL (ref 8–27)
CO2: 19 mmol/L — ABNORMAL LOW (ref 20–29)
Calcium: 9.3 mg/dL (ref 8.7–10.3)
Chloride: 101 mmol/L (ref 96–106)
Creatinine, Ser: 1.13 mg/dL — ABNORMAL HIGH (ref 0.57–1.00)
Glucose: 125 mg/dL — ABNORMAL HIGH (ref 65–99)
Potassium: 3.9 mmol/L (ref 3.5–5.2)
Sodium: 140 mmol/L (ref 134–144)
eGFR: 53 mL/min/{1.73_m2} — ABNORMAL LOW (ref >59)

## 2020-05-23 LAB — PROTIME-INR
INR: 1 (ref 0.9–1.2)
Prothrombin Time: 10.2 s (ref 9.1–12.0)

## 2020-05-23 LAB — APTT: aPTT: 30 s (ref 24–33)

## 2020-05-23 NOTE — Progress Notes (Signed)
PCP - Dr. [name] Cardiologist - Dr. Skeet Latch  Chest x-ray - DOS EKG - 05/22/20 Stress Test -  ECHO - 01/10/19 Cardiac Cath -    Blood Thinner Instructions: per pt last dose Plavix 05/17/20 Aspirin Instructions:    COVID TEST- pending   Anesthesia review: yes   -------------  SDW INSTRUCTIONS:  Your procedure is scheduled on 05/24/20. Please report to [facility] Main Entrance "A" at 10:40 A.M., and check in at the Admitting office. Call this number if you have problems the morning of surgery: [phone]   Remember: Do not eat or drink after midnight the night before your surgery   Medications to take morning of surgery with a sip of water include: amLODipine (NORVASC)  cetirizine (ZYRTEC)  ezetimibe (ZETIA)  gabapentin (NEURONTIN)  pantoprazole (PROTONIX) sertraline (ZOLOFT) tiZANidine (ZANAFLEX)   If needed: acetaminophen (TYLENOL)  albuterol (VENTOLIN HFA) fluticasone (FLONASE) traMADol (ULTRAM)  As of today, STOP taking any Aspirin (unless otherwise instructed by your surgeon), Aleve, Naproxen, Ibuprofen, Motrin, Advil, Goody's, BC's, all herbal medications, fish oil, and all vitamins.    The Morning of Surgery Do not wear jewelry, make-up or nail polish. Do not wear lotions, powders, or perfumes, or deodorant Do not shave 48 hours prior to surgery.   Do not bring valuables to the hospital. [facility] is not responsible for any belongings or valuables. If you are a smoker, DO NOT Smoke 24 hours prior to surgery If you wear a CPAP at night please bring your mask the morning of surgery  Remember that you must have someone to transport you home after your surgery, and remain with you for 24 hours if you are discharged the same day. Please bring cases for contacts, glasses, hearing aids, dentures or bridgework because it cannot be worn into surgery.   Patients discharged the day of surgery will not be allowed to drive home.   Please shower the  NIGHT BEFORE SURGERY and the MORNING OF SURGERY with DIAL Soap. Wear comfortable clothes the morning of surgery. Oral Hygiene is also important to reduce your risk of infection.  Remember - BRUSH YOUR TEETH THE MORNING OF SURGERY WITH YOUR REGULAR TOOTHPASTE  Patient denies shortness of breath, fever, cough and chest pain.

## 2020-05-23 NOTE — Telephone Encounter (Signed)
   Primary Cardiologist: Skeet Latch, MD  Chart reviewed as part of pre-operative protocol coverage. Patient was seen in the office by Almyra Deforest, PA-C 05/22/20, therefore will defer any further testing/clearance to him.    I will remove from pre-op pool at this time.   Abigail Butts, PA-C 05/23/2020, 9:05 AM

## 2020-05-23 NOTE — Telephone Encounter (Signed)
[facility] calls checking the status of surgical clearance. Form faxed today and refaxed by me at 4:51pm.

## 2020-05-23 NOTE — Telephone Encounter (Signed)
Patient is calling to check on the status of her surgical clearance being faxed. Her procedure is tomorrow 05/24/20 and the surgeon would like to have this paper work by 12:00pm today.   The best fax number is [phone]

## 2020-05-24 ENCOUNTER — Ambulatory Visit (HOSPITAL_COMMUNITY): Payer: Medicare Other | Admitting: Physician Assistant

## 2020-05-24 ENCOUNTER — Observation Stay (HOSPITAL_COMMUNITY)
Admission: RE | Admit: 2020-05-24 | Discharge: 2020-05-25 | Disposition: A | Discharge status: Home / Self Care | Payer: Medicare Other | Attending: Orthopedic Surgery | Admitting: Orthopedic Surgery

## 2020-05-24 ENCOUNTER — Ambulatory Visit (HOSPITAL_COMMUNITY): Payer: Medicare Other

## 2020-05-24 ENCOUNTER — Encounter (HOSPITAL_COMMUNITY)
Admission: RE | Disposition: A | Discharge status: Home / Self Care | Payer: Self-pay | Source: Home / Self Care | Attending: Orthopedic Surgery

## 2020-05-24 ENCOUNTER — Other Ambulatory Visit: Payer: Self-pay

## 2020-05-24 ENCOUNTER — Encounter (HOSPITAL_COMMUNITY): Payer: Self-pay | Admitting: Orthopedic Surgery

## 2020-05-24 DIAGNOSIS — I129 Hypertensive chronic kidney disease with stage 1 through stage 4 chronic kidney disease, or unspecified chronic kidney disease: Secondary | ICD-10-CM | POA: Insufficient documentation

## 2020-05-24 DIAGNOSIS — N183 Chronic kidney disease, stage 3 unspecified: Secondary | ICD-10-CM | POA: Diagnosis not present

## 2020-05-24 DIAGNOSIS — Z01818 Encounter for other preprocedural examination: Secondary | ICD-10-CM | POA: Diagnosis not present

## 2020-05-24 DIAGNOSIS — G959 Disease of spinal cord, unspecified: Secondary | ICD-10-CM | POA: Diagnosis present

## 2020-05-24 DIAGNOSIS — Z8673 Personal history of transient ischemic attack (TIA), and cerebral infarction without residual deficits: Secondary | ICD-10-CM | POA: Insufficient documentation

## 2020-05-24 DIAGNOSIS — Z419 Encounter for procedure for purposes other than remedying health state, unspecified: Secondary | ICD-10-CM

## 2020-05-24 DIAGNOSIS — Z981 Arthrodesis status: Secondary | ICD-10-CM | POA: Diagnosis not present

## 2020-05-24 DIAGNOSIS — M4712 Other spondylosis with myelopathy, cervical region: Secondary | ICD-10-CM | POA: Diagnosis not present

## 2020-05-24 DIAGNOSIS — E785 Hyperlipidemia, unspecified: Secondary | ICD-10-CM | POA: Diagnosis not present

## 2020-05-24 DIAGNOSIS — M4322 Fusion of spine, cervical region: Secondary | ICD-10-CM | POA: Diagnosis not present

## 2020-05-24 DIAGNOSIS — Z01811 Encounter for preprocedural respiratory examination: Secondary | ICD-10-CM

## 2020-05-24 HISTORY — PX: ANTERIOR CERVICAL DECOMP/DISCECTOMY FUSION: SHX1161

## 2020-05-24 LAB — SARS-COV-2, NAA 2 DAY TAT

## 2020-05-24 LAB — SURGICAL PCR SCREEN
MRSA, PCR: NEGATIVE
Staphylococcus aureus: NEGATIVE

## 2020-05-24 LAB — NOVEL CORONAVIRUS, NAA: SARS-CoV-2, NAA: NOT DETECTED

## 2020-05-24 SURGERY — ANTERIOR CERVICAL DECOMPRESSION/DISCECTOMY FUSION 1 LEVEL
Anesthesia: General

## 2020-05-24 MED ORDER — EPHEDRINE SULFATE-NACL 50-0.9 MG/10ML-% IV SOSY
PREFILLED_SYRINGE | INTRAVENOUS | Status: DC | PRN
  Administered 2020-05-24: 10 mg via INTRAVENOUS
  Administered 2020-05-24: 15 mg via INTRAVENOUS
  Administered 2020-05-24: 10 mg via INTRAVENOUS
  Administered 2020-05-24: 15 mg via INTRAVENOUS

## 2020-05-24 MED ORDER — ACETAMINOPHEN 650 MG RE SUPP: 650.0000 mg | RECTAL | Status: DC | PRN

## 2020-05-24 MED ORDER — HYDROMORPHONE HCL 1 MG/ML IJ SOLN
INTRAMUSCULAR | Status: AC
  Filled 2020-05-24: qty 1

## 2020-05-24 MED ORDER — ALBUTEROL SULFATE HFA 108 (90 BASE) MCG/ACT IN AERS
2.0000 | INHALATION_SPRAY | Freq: Four times a day (QID) | RESPIRATORY_TRACT | Status: DC | PRN
  Filled 2020-05-24: qty 6.7

## 2020-05-24 MED ORDER — FLUTICASONE PROPIONATE 50 MCG/ACT NA SUSP
1.0000 | Freq: Every day | NASAL | Status: DC | PRN
  Filled 2020-05-24: qty 16

## 2020-05-24 MED ORDER — CHLORHEXIDINE GLUCONATE 0.12 % MT SOLN
15.0000 mL | Freq: Once | OROMUCOSAL | Status: AC
  Administered 2020-05-24: 15 mL via OROMUCOSAL
  Filled 2020-05-24: qty 15

## 2020-05-24 MED ORDER — METHOCARBAMOL 500 MG PO TABS
500.0000 mg | ORAL_TABLET | Freq: Three times a day (TID) | ORAL | 0 refills | Status: AC | PRN
Start: 2020-05-24 — End: 2020-05-29

## 2020-05-24 MED ORDER — OXYCODONE HCL 5 MG PO TABS: 5.0000 mg | ORAL_TABLET | Freq: Once | ORAL | Status: DC | PRN

## 2020-05-24 MED ORDER — HYDROMORPHONE HCL 1 MG/ML IJ SOLN
0.2500 mg | INTRAMUSCULAR | Status: DC | PRN
  Administered 2020-05-24: 0.5 mg via INTRAVENOUS

## 2020-05-24 MED ORDER — FUROSEMIDE 40 MG PO TABS
40.0000 mg | ORAL_TABLET | Freq: Every day | ORAL | Status: DC
  Administered 2020-05-25: 40 mg via ORAL
  Filled 2020-05-24: qty 1

## 2020-05-24 MED ORDER — MUPIROCIN 2 % EX OINT
TOPICAL_OINTMENT | CUTANEOUS | Status: AC
  Administered 2020-05-24: 1 via TOPICAL
  Filled 2020-05-24: qty 22

## 2020-05-24 MED ORDER — GABAPENTIN 300 MG PO CAPS
600.0000 mg | ORAL_CAPSULE | Freq: Two times a day (BID) | ORAL | Status: DC
Start: 2020-05-24 — End: 2020-05-25
  Administered 2020-05-24 – 2020-05-25 (×2): 600 mg via ORAL
  Filled 2020-05-24 (×2): qty 2

## 2020-05-24 MED ORDER — FENTANYL CITRATE (PF) 250 MCG/5ML IJ SOLN
INTRAMUSCULAR | Status: AC
  Filled 2020-05-24: qty 5

## 2020-05-24 MED ORDER — LIDOCAINE 2% (20 MG/ML) 5 ML SYRINGE
INTRAMUSCULAR | Status: AC
  Filled 2020-05-24: qty 5

## 2020-05-24 MED ORDER — ACETAMINOPHEN 325 MG PO TABS
650.0000 mg | ORAL_TABLET | ORAL | Status: DC | PRN
  Administered 2020-05-24 – 2020-05-25 (×2): 650 mg via ORAL
  Filled 2020-05-24 (×2): qty 2

## 2020-05-24 MED ORDER — MEPERIDINE HCL 25 MG/ML IJ SOLN
6.2500 mg | INTRAMUSCULAR | Status: DC | PRN
Start: 2020-05-24 — End: 2020-05-24

## 2020-05-24 MED ORDER — BUPIVACAINE-EPINEPHRINE 0.25% -1:200000 IJ SOLN
INTRAMUSCULAR | Status: DC | PRN
  Administered 2020-05-24: 5 mL

## 2020-05-24 MED ORDER — PROPOFOL 10 MG/ML IV BOLUS
INTRAVENOUS | Status: DC | PRN
  Administered 2020-05-24: 130 mg via INTRAVENOUS

## 2020-05-24 MED ORDER — SERTRALINE HCL 50 MG PO TABS
50.0000 mg | ORAL_TABLET | Freq: Every day | ORAL | Status: DC
  Administered 2020-05-25: 50 mg via ORAL
  Filled 2020-05-24: qty 1

## 2020-05-24 MED ORDER — OXYCODONE HCL 5 MG PO TABS
10.0000 mg | ORAL_TABLET | ORAL | Status: DC | PRN
  Administered 2020-05-25 (×3): 10 mg via ORAL
  Filled 2020-05-24 (×3): qty 2

## 2020-05-24 MED ORDER — SUCCINYLCHOLINE CHLORIDE 200 MG/10ML IV SOSY
PREFILLED_SYRINGE | INTRAVENOUS | Status: DC | PRN
  Administered 2020-05-24: 130 mg via INTRAVENOUS

## 2020-05-24 MED ORDER — MIDAZOLAM HCL 2 MG/2ML IJ SOLN
INTRAMUSCULAR | Status: DC | PRN
  Administered 2020-05-24: 2 mg via INTRAVENOUS

## 2020-05-24 MED ORDER — SODIUM CHLORIDE 0.9 % IV SOLN: 250.0000 mL | INTRAVENOUS | Status: DC

## 2020-05-24 MED ORDER — ONDANSETRON HCL 4 MG/2ML IJ SOLN
INTRAMUSCULAR | Status: DC | PRN
  Administered 2020-05-24: 4 mg via INTRAVENOUS

## 2020-05-24 MED ORDER — METHOCARBAMOL 1000 MG/10ML IJ SOLN
500.0000 mg | Freq: Four times a day (QID) | INTRAVENOUS | Status: DC | PRN
  Filled 2020-05-24 (×2): qty 5

## 2020-05-24 MED ORDER — PHENYLEPHRINE HCL-NACL 10-0.9 MG/250ML-% IV SOLN
INTRAVENOUS | Status: DC | PRN
  Administered 2020-05-24: 40 ug/min via INTRAVENOUS

## 2020-05-24 MED ORDER — CEFAZOLIN SODIUM-DEXTROSE 2-4 GM/100ML-% IV SOLN
2.0000 g | INTRAVENOUS | Status: AC
  Administered 2020-05-24: 2 g via INTRAVENOUS
  Filled 2020-05-24: qty 100

## 2020-05-24 MED ORDER — DOXEPIN HCL 100 MG PO CAPS
200.0000 mg | ORAL_CAPSULE | Freq: Every day | ORAL | Status: DC
  Filled 2020-05-24: qty 2

## 2020-05-24 MED ORDER — AMLODIPINE BESYLATE 5 MG PO TABS
10.0000 mg | ORAL_TABLET | Freq: Every day | ORAL | Status: DC
  Administered 2020-05-25: 10 mg via ORAL
  Filled 2020-05-24: qty 2

## 2020-05-24 MED ORDER — LACTATED RINGERS IV SOLN: INTRAVENOUS | Status: DC

## 2020-05-24 MED ORDER — HYDROMORPHONE HCL 1 MG/ML IJ SOLN
0.5000 mg | INTRAMUSCULAR | Status: DC | PRN
Start: 2020-05-24 — End: 2020-05-25

## 2020-05-24 MED ORDER — MUPIROCIN 2 % EX OINT: 1.0000 "application " | TOPICAL_OINTMENT | Freq: Once | CUTANEOUS | Status: AC

## 2020-05-24 MED ORDER — ONDANSETRON HCL 4 MG PO TABS: 4.0000 mg | ORAL_TABLET | Freq: Four times a day (QID) | ORAL | Status: DC | PRN

## 2020-05-24 MED ORDER — OXYCODONE HCL 5 MG PO TABS
5.0000 mg | ORAL_TABLET | ORAL | Status: DC | PRN
  Administered 2020-05-24: 5 mg via ORAL
  Filled 2020-05-24: qty 1

## 2020-05-24 MED ORDER — ORAL CARE MOUTH RINSE: 15.0000 mL | Freq: Once | OROMUCOSAL | Status: AC

## 2020-05-24 MED ORDER — PROPOFOL 10 MG/ML IV BOLUS
INTRAVENOUS | Status: AC
  Filled 2020-05-24: qty 40

## 2020-05-24 MED ORDER — OXYCODONE HCL 5 MG/5ML PO SOLN: 5.0000 mg | Freq: Once | ORAL | Status: DC | PRN

## 2020-05-24 MED ORDER — FENTANYL CITRATE (PF) 100 MCG/2ML IJ SOLN: 25.0000 ug | INTRAMUSCULAR | Status: DC | PRN

## 2020-05-24 MED ORDER — HEMOSTATIC AGENTS (NO CHARGE) OPTIME
TOPICAL | Status: DC | PRN
  Administered 2020-05-24: 1 via TOPICAL

## 2020-05-24 MED ORDER — MENTHOL 3 MG MT LOZG: 1.0000 | LOZENGE | OROMUCOSAL | Status: DC | PRN

## 2020-05-24 MED ORDER — CEFAZOLIN SODIUM-DEXTROSE 1-4 GM/50ML-% IV SOLN
1.0000 g | Freq: Three times a day (TID) | INTRAVENOUS | Status: AC
  Administered 2020-05-24 – 2020-05-25 (×2): 1 g via INTRAVENOUS
  Filled 2020-05-24 (×2): qty 50

## 2020-05-24 MED ORDER — PROPOFOL 500 MG/50ML IV EMUL
INTRAVENOUS | Status: DC | PRN
Start: 2020-05-24 — End: 2020-05-24
  Administered 2020-05-24: 75 ug/kg/min via INTRAVENOUS
  Administered 2020-05-24: 100 ug/kg/min via INTRAVENOUS

## 2020-05-24 MED ORDER — SUCCINYLCHOLINE CHLORIDE 200 MG/10ML IV SOSY
PREFILLED_SYRINGE | INTRAVENOUS | Status: AC
  Filled 2020-05-24: qty 10

## 2020-05-24 MED ORDER — OXYCODONE-ACETAMINOPHEN 10-325 MG PO TABS: 1.0000 | ORAL_TABLET | Freq: Four times a day (QID) | ORAL | 0 refills | Status: AC | PRN

## 2020-05-24 MED ORDER — DEXAMETHASONE SODIUM PHOSPHATE 10 MG/ML IJ SOLN
INTRAMUSCULAR | Status: AC
  Filled 2020-05-24: qty 1

## 2020-05-24 MED ORDER — AMISULPRIDE (ANTIEMETIC) 5 MG/2ML IV SOLN: 10.0000 mg | Freq: Once | INTRAVENOUS | Status: DC | PRN

## 2020-05-24 MED ORDER — SODIUM CHLORIDE 0.9% FLUSH: 3.0000 mL | INTRAVENOUS | Status: DC | PRN

## 2020-05-24 MED ORDER — ONDANSETRON HCL 4 MG/2ML IJ SOLN: 4.0000 mg | Freq: Four times a day (QID) | INTRAMUSCULAR | Status: DC | PRN

## 2020-05-24 MED ORDER — ROCURONIUM BROMIDE 10 MG/ML (PF) SYRINGE
PREFILLED_SYRINGE | INTRAVENOUS | Status: AC
  Filled 2020-05-24: qty 10

## 2020-05-24 MED ORDER — PHENOL 1.4 % MT LIQD: 1.0000 | OROMUCOSAL | Status: DC | PRN

## 2020-05-24 MED ORDER — EZETIMIBE 10 MG PO TABS
10.0000 mg | ORAL_TABLET | Freq: Every day | ORAL | Status: DC
  Administered 2020-05-25: 10 mg via ORAL
  Filled 2020-05-24: qty 1

## 2020-05-24 MED ORDER — SODIUM CHLORIDE 0.9% FLUSH: 3.0000 mL | Freq: Two times a day (BID) | INTRAVENOUS | Status: DC

## 2020-05-24 MED ORDER — 0.9 % SODIUM CHLORIDE (POUR BTL) OPTIME
TOPICAL | Status: DC | PRN
  Administered 2020-05-24: 1000 mL

## 2020-05-24 MED ORDER — EPHEDRINE 5 MG/ML INJ
INTRAVENOUS | Status: AC
  Filled 2020-05-24: qty 20

## 2020-05-24 MED ORDER — METHOCARBAMOL 500 MG PO TABS
500.0000 mg | ORAL_TABLET | Freq: Four times a day (QID) | ORAL | Status: DC | PRN
  Administered 2020-05-24 – 2020-05-25 (×2): 500 mg via ORAL
  Filled 2020-05-24 (×2): qty 1

## 2020-05-24 MED ORDER — DEXAMETHASONE SODIUM PHOSPHATE 10 MG/ML IJ SOLN
INTRAMUSCULAR | Status: DC | PRN
  Administered 2020-05-24: 10 mg via INTRAVENOUS

## 2020-05-24 MED ORDER — DOXEPIN HCL 50 MG PO CAPS
200.0000 mg | ORAL_CAPSULE | Freq: Every day | ORAL | Status: DC
  Filled 2020-05-24 (×3): qty 4

## 2020-05-24 MED ORDER — POLYETHYLENE GLYCOL 3350 17 G PO PACK: 17.0000 g | PACK | Freq: Every day | ORAL | Status: DC | PRN

## 2020-05-24 MED ORDER — ONDANSETRON HCL 4 MG PO TABS: 4.0000 mg | ORAL_TABLET | Freq: Three times a day (TID) | ORAL | 0 refills | Status: AC | PRN

## 2020-05-24 MED ORDER — ACETAMINOPHEN 10 MG/ML IV SOLN
INTRAVENOUS | Status: DC | PRN
Start: 2020-05-24 — End: 2020-05-24
  Administered 2020-05-24: 1000 mg via INTRAVENOUS

## 2020-05-24 MED ORDER — LIDOCAINE 2% (20 MG/ML) 5 ML SYRINGE
INTRAMUSCULAR | Status: DC | PRN
  Administered 2020-05-24: 60 mg via INTRAVENOUS

## 2020-05-24 MED ORDER — MIDAZOLAM HCL 2 MG/2ML IJ SOLN
INTRAMUSCULAR | Status: AC
  Filled 2020-05-24: qty 2

## 2020-05-24 MED ORDER — ONDANSETRON HCL 4 MG/2ML IJ SOLN
INTRAMUSCULAR | Status: AC
  Filled 2020-05-24: qty 2

## 2020-05-24 MED ORDER — FENTANYL CITRATE (PF) 250 MCG/5ML IJ SOLN
INTRAMUSCULAR | Status: DC | PRN
  Administered 2020-05-24: 75 ug via INTRAVENOUS

## 2020-05-24 MED ORDER — BUPIVACAINE HCL (PF) 0.25 % IJ SOLN
INTRAMUSCULAR | Status: AC
  Filled 2020-05-24: qty 30

## 2020-05-24 MED ORDER — SODIUM CHLORIDE 0.9 % IV SOLN
0.0125 ug/kg/min | INTRAVENOUS | Status: DC
  Administered 2020-05-24: .15 ug/kg/min via INTRAVENOUS
  Filled 2020-05-24 (×3): qty 2000

## 2020-05-24 MED ORDER — DOCUSATE SODIUM 100 MG PO CAPS
100.0000 mg | ORAL_CAPSULE | Freq: Two times a day (BID) | ORAL | Status: DC
  Administered 2020-05-24 – 2020-05-25 (×2): 100 mg via ORAL
  Filled 2020-05-24 (×2): qty 1

## 2020-05-24 SURGICAL SUPPLY — 66 items
AGENT HMST KT MTR STRL THRMB (HEMOSTASIS)
BLADE CLIPPER SURG (BLADE) IMPLANT
BONE VIVIGEN FORMABLE 1.3CC (Bone Implant) ×2 IMPLANT
CABLE BIPOLOR RESECTION CORD (MISCELLANEOUS) ×2 IMPLANT
CANISTER SUCT 3000ML PPV (MISCELLANEOUS) ×2 IMPLANT
CLSR STERI-STRIP ANTIMIC 1/2X4 (GAUZE/BANDAGES/DRESSINGS) ×2 IMPLANT
COVER MAYO STAND STRL (DRAPES) ×6 IMPLANT
COVER PLATE (Plate) ×1 IMPLANT
COVER SURGICAL LIGHT HANDLE (MISCELLANEOUS) ×4 IMPLANT
COVER WAND RF STERILE (DRAPES) ×1 IMPLANT
DEVICE FUSION NANLCK 6 DEG 6 (Screw) ×1 IMPLANT
DRAPE C-ARM 42X72 X-RAY (DRAPES) ×2 IMPLANT
DRAPE POUCH INSTRU U-SHP 10X18 (DRAPES) ×2 IMPLANT
DRAPE SURG 17X23 STRL (DRAPES) ×2 IMPLANT
DRAPE U-SHAPE 47X51 STRL (DRAPES) ×2 IMPLANT
DRSG OPSITE POSTOP 3X4 (GAUZE/BANDAGES/DRESSINGS) ×2 IMPLANT
DRSG OPSITE POSTOP 4X6 (GAUZE/BANDAGES/DRESSINGS) ×1 IMPLANT
DURAPREP 26ML APPLICATOR (WOUND CARE) ×2 IMPLANT
ELECT COATED BLADE 2.86 ST (ELECTRODE) ×2 IMPLANT
ELECT NVM5 SURFACE MEP/EMG (ELECTRODE) ×2 IMPLANT
ELECT PENCIL ROCKER SW 15FT (MISCELLANEOUS) ×2 IMPLANT
ELECT REM PT RETURN 9FT ADLT (ELECTROSURGICAL) ×2
ELECTRODE REM PT RTRN 9FT ADLT (ELECTROSURGICAL) ×1 IMPLANT
FUSION TCS NANOLOCK 6 DEG 6 (Screw) ×2 IMPLANT
GLOVE BIO SURGEON STRL SZ 6.5 (GLOVE) ×2 IMPLANT
GLOVE BIOGEL PI IND STRL 8.5 (GLOVE) ×1 IMPLANT
GLOVE BIOGEL PI INDICATOR 8.5 (GLOVE) ×1
GLOVE SS BIOGEL STRL SZ 8.5 (GLOVE) ×1 IMPLANT
GLOVE SUPERSENSE BIOGEL SZ 8.5 (GLOVE) ×1
GLOVE SURG UNDER POLY LF SZ6.5 (GLOVE) ×2 IMPLANT
GOWN STRL REUS W/ TWL LRG LVL3 (GOWN DISPOSABLE) ×1 IMPLANT
GOWN STRL REUS W/TWL 2XL LVL3 (GOWN DISPOSABLE) ×2 IMPLANT
GOWN STRL REUS W/TWL LRG LVL3 (GOWN DISPOSABLE) ×2
KIT BASIN OR (CUSTOM PROCEDURE TRAY) ×2 IMPLANT
KIT TURNOVER KIT B (KITS) ×2 IMPLANT
MODULE EMG NDL SSEP NVM5 (NEEDLE) IMPLANT
MODULE EMG NEEDLE SSEP NVM5 (NEEDLE) ×2 IMPLANT
NEEDLE HYPO 22GX1.5 SAFETY (NEEDLE) ×2 IMPLANT
NEEDLE SPNL 18GX3.5 QUINCKE PK (NEEDLE) ×2 IMPLANT
NS IRRIG 1000ML POUR BTL (IV SOLUTION) ×2 IMPLANT
PACK ORTHO CERVICAL (CUSTOM PROCEDURE TRAY) ×2 IMPLANT
PACK UNIVERSAL I (CUSTOM PROCEDURE TRAY) ×2 IMPLANT
PAD ARMBOARD 7.5X6 YLW CONV (MISCELLANEOUS) ×4 IMPLANT
PATTIES SURGICAL .25X.25 (GAUZE/BANDAGES/DRESSINGS) IMPLANT
PLATE LOCK ENDO TCS (Plate) ×1 IMPLANT
PLATE LOCK ENDO TCS F/COVER (Plate) IMPLANT
POSITIONER HEAD DONUT 9IN (MISCELLANEOUS) ×2 IMPLANT
RESTRAINT LIMB HOLDER UNIV (RESTRAINTS) ×1 IMPLANT
SCREW ENDO BONE 3.8X14MM (Screw) ×2 IMPLANT
SPONGE INTESTINAL PEANUT (DISPOSABLE) ×2 IMPLANT
SPONGE SURGIFOAM ABS GEL SZ50 (HEMOSTASIS) IMPLANT
SURGIFLO W/THROMBIN 8M KIT (HEMOSTASIS) IMPLANT
SUT BONE WAX W31G (SUTURE) ×2 IMPLANT
SUT MNCRL AB 3-0 PS2 27 (SUTURE) ×2 IMPLANT
SUT SILK 2 0 (SUTURE) ×2
SUT SILK 2-0 18XBRD TIE 12 (SUTURE) ×1 IMPLANT
SUT VIC AB 2-0 CT1 18 (SUTURE) ×2 IMPLANT
SYR BULB IRRIG 60ML STRL (SYRINGE) ×2 IMPLANT
SYR CONTROL 10ML LL (SYRINGE) ×2 IMPLANT
TAPE CLOTH 4X10 WHT NS (GAUZE/BANDAGES/DRESSINGS) ×2 IMPLANT
TAPE UMBILICAL COTTON 1/8X30 (MISCELLANEOUS) ×2 IMPLANT
TOWEL GREEN STERILE (TOWEL DISPOSABLE) ×2 IMPLANT
TOWEL GREEN STERILE FF (TOWEL DISPOSABLE) ×2 IMPLANT
TRAY FOLEY W/BAG SLVR 14FR (SET/KITS/TRAYS/PACK) ×1 IMPLANT
WATER STERILE IRR 1000ML POUR (IV SOLUTION) ×2 IMPLANT
YANKAUER SUCT BULB TIP NO VENT (SUCTIONS) ×1 IMPLANT

## 2020-05-24 NOTE — Anesthesia Preprocedure Evaluation (Signed)
Anesthesia Evaluation  Patient identified by MRN, date of birth, ID band Patient awake    Reviewed: Allergy & Precautions, H&P , NPO status , Patient's Chart, lab work & pertinent test results  Airway Mallampati: II   Neck ROM: full    Dental   Pulmonary neg pulmonary ROS, former smoker,    breath sounds clear to auscultation       Cardiovascular hypertension, + Peripheral Vascular Disease   Rhythm:regular Rate:Normal     Neuro/Psych  Headaches, PSYCHIATRIC DISORDERS Anxiety  Neuromuscular disease CVA    GI/Hepatic GERD  ,  Endo/Other    Renal/GU Renal InsufficiencyRenal disease     Musculoskeletal  (+) Arthritis ,   Abdominal   Peds  Hematology   Anesthesia Other Findings   Reproductive/Obstetrics                             Anesthesia Physical Anesthesia Plan  ASA: III  Anesthesia Plan: General   Post-op Pain Management:    Induction: Intravenous  PONV Risk Score and Plan: 3 and Ondansetron, Dexamethasone and Treatment may vary due to age or medical condition  Airway Management Planned: Oral ETT  Additional Equipment:   Intra-op Plan:   Post-operative Plan: Extubation in OR  Informed Consent: I have reviewed the patients History and Physical, chart, labs and discussed the procedure including the risks, benefits and alternatives for the proposed anesthesia with the patient or authorized representative who has indicated his/her understanding and acceptance.     Dental advisory given  Plan Discussed with: CRNA, Anesthesiologist and Surgeon  Anesthesia Plan Comments:         Anesthesia Quick Evaluation

## 2020-05-24 NOTE — Op Note (Signed)
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  Prior C4-7 ACDF with an anterior cervical plate at F6-2 level.  POST-OPERATIVE DIAGNOSIS: Same  PROCEDURE:   ACDF C3-4  SURGEON:  Melina Schools, MD  PHYSICIAN ASSISTANT: Amanda Ward, PA  ANESTHESIA:   General  EBL: 25 ml   Implants: Medtronic/Titan 0 profile intervertebral cage.  6 mm small lordotic implant.  14 mm locking screws with anterior 0 profile locking plate  Graft: vivigen  Neuro monitoring: Improvement in SSEP/evoked motor potentials at the conclusion of the case.  No adverse activity or abnormal free running EMGs throughout the case.  BRIEF HISTORY: [name] is a 70 y.o. female who who has had prior cervical fusions including an anterior cervical discectomy and fusion Z3-0 with application of cervical plate.  The patient has noticed increased difficulty walking, maintaining her balance, and weakness.  Imaging studies demonstrated cervical spondylitic myelopathy at the C3-4 level.  Because of the adjacent segment disease with myelopathy we elected to move forward with surgery.  All appropriate risks, benefits, and consent was obtained, and she expressed willingness to move forward with surgery.  PROCEDURE DETAILS: Patient was brought into the operating room. After successful induction of general anesthesia and endotracheal intubation a Time Out was done. This confirmed all pertinent important data.  The right upper extremity was placed over her chest in order to prevent further displacement of her proximal humerus fracture.  The shoulders were taped down in order to facilitate visualization with fluoroscopy.  The anterior cervical spine was prepped and draped in a standard fashion.  Using lateral fluoroscopy identified the C3-4 level.  I marked out a transverse  incision on the left side and infiltrated with quarter percent Marcaine with epinephrine.  An incision was made starting at the midline and proceeding to the left.  Sharp dissection was carried out down to and through the platysma.  The jugular was identified and mobilized medially and I continue to work down into the deep cervical fascia along the medial border of the sternocleidomastoid muscle.  I then bluntly dissected through the remainder of the prevertebral fascia and identified the traversing vein.  It was under tension from the retractor I elected to sacrifice the I tied it off and incised.  I then mobilized the remainder of the prevertebral fascia to expose the anterior longitudinal ligament and the cervical plate at Q6-5.  I placed a needle into the disc space and confirmed as at the appropriate level.  Using bipolar cautery I mobilized the longus coli muscle from the superior aspect of C3 to the midportion of C4.  I then placed self-retaining retractors underneath the longus coli muscle deflated the endotracheal cuff and expanded the retractor and then reinflated the cuff.  An annulotomy was performed with a 15 blade scalpel I remove the bulk of the disc material.  I removed one of the screws from the cervical plate in order to make room for a distraction pin.  The distraction pin was placed into the screw hole and a second distraction pin was placed into the body of C3.  I distracted the intervertebral space with a lamina spreader and maintained it with a distractor system.  I then continued my discectomy using nerve curettes posteriorly until I was at the posterior aspect of the vertebral body.  I then used a fine nerve hook  to develop a plane underneath the posterior aspect of the vertebral body and I resected the osteophyte with a 1 mm Kerrison rongeur.  I then gently dissected through the posterior annulus and posterior longitudinal ligament until I was able to develop a plane between the PLL and the  thecal sac.  Using the 1 mm Kerrison rongeur I resected this.  This allowed me to undercut the uncovertebral joint and further decompress the spine and the nerve root.  At this point I had excellent central decompression.  I had remove the osteophyte and thickened ligamentum flavum that was causing compression of the spinal canal.  At this point I then used the trial intervertebral spacers and elected to use the size 6 small intervertebral spacer.  This provided the best overall restoration of intervertebral distance as well as indirect foraminal decompression.  The graft was obtained and packed with the allograft.  I rasped the endplates so I had bleeding subchondral bone and then irrigated the wound copiously with normal saline.  I then inserted the intervertebral cage under direct utilization.  I confirmed proper position with fluoroscopy.  Once it was properly placed I then placed a single locking screw through the cage and into the C3 vertebral body.  This is a 14 mm length screw and had excellent purchase.  A second locking screw was placed through the cage and into the C4 vertebral body.  This too had excellent overall purchase.  The anterior locking plate was then fixed over the device.  This was tightened according manufacture standards.  At this point the distraction pins were removed in order to prevent excessive bleeding from the screw hole I have replaced the screw that I had removed from the C4 plate and the locking device to keep it from moving.  At this point I irrigated the wound copiously normal saline and used electrocautery and FloSeal to obtain and maintain hemostasis.  The trach and esophagus were returned to midline.  The platysma was closed with interrupted 2-0 Vicryl sutures, and the skin with a 3-0 Monocryl.  Steri-Strips and a dry dressing were applied.  At this point time was brought to my attention that the circulating nurse did not have an accurate count of the needles.  The initial  needle count was not recorded and therefore the final needle count could not be determined as correct.  As result of the inability to accurately assess the count an intraoperative x-ray was done and read by the radiologist.  The radiologist confirmed there were no retained surgical instruments in the wound.  Once the surgical site was cleared dry dressings were applied and the patient was ultimately extubated transfer the PACU without incident.  The end of the case all needle sponge counts were correct.  There were no other adverse intraoperative events.  Melina Schools, MD 05/24/2020 5:26 PM

## 2020-05-24 NOTE — H&P (Signed)
Addendum H&P: Patient presents today for definitive management of cervical spondylitic myelopathy.  She has had a prior cervical fusion and now has adjacent segment C3-4 cervical spondylitic myelopathy.  Patient was last examined on 05/15/2020 and the only change to her exam she had a fall yesterday that resulted in a right proximal humerus fracture.  She has tenderness and swelling over the anterior shoulder.  There is been no change in her underlying neurological exam when compared to her last office visit.  We have gone over the surgical procedure in great detail and all of her questions were addressed.  We will plan on moving forward with surgery which would be an ACDF at C3-4.  All appropriate risks, benefits, alternatives to surgery were discussed with the patient and she expressed a willingness and consented to move forward with surgery.

## 2020-05-24 NOTE — Discharge Instructions (Signed)
° ° ° ° ° °Today you will be discharged from the hospital.  The purpose of the following handout is to help guide you over the next 2 weeks.  First and foremost, be sure you have a follow up appointment with Dr. Meyah Corle 2 weeks from the time of your surgery to have your sutures removed.  Please call [facility] [phone] to schedule or confirm this appointment.   ° ° ° °Brace °You do not have to wear the collar while lying in bed or sitting in a high-backed chair, eating, sleeping or showering.  Other than these instances, you must wear the brace.  You may NOT wear the collar while driving a vehicle (see driving restrictions below).  It is advisable that you wear the collar in public places or while traveling in a car as a passenger.  Dr. Dwyane Dupree will discuss further use of the collar at your 2 week postop visit. ° °Wound Care °You may SHOWER 5 days from the date of surgery.  Shower directly over the steri-strips.  DO NOT scrub or submerge (bath tub, swimming pool, hot tub, etc.) the area.  Pat to dry following your shower.  There is no need for additional dressings other than the steri-strips.  Allow the steri-strips to fall off on their own.  Once the strips have fallen off, you may leave the area undressed.  DO NOT apply lotion/cream/ointment to the area.  The wound must remain dry at all times other than while showering.  Dr. Roberts Bon or his staff will remove your stiches at your first postop visit and give you additional instructions regarding wound care at that time.  ° °Activity °NO DRIVING FOR 2 WEEKS.  No lifting over 5 pounds (approximately a gallon of milk).  No bending, stooping, squatting or twisting.  No overhead activities.  We encourage you to walk (short distances and often throughout the day) as you can tolerate.  A good rule of thumb is to get up and move once or twice every hour.  You may go up and down stairs carefully.  As you continue to recover, Dr. Tonya Wantz will address and  adjust restrictions to your activities until no further restrictions are needed.  However, until your first postop visit, when Dr. Corita Allinson can assess your recovery, you are to follow these instructions.  At the end of this document is a tentative outline of activities for up to 1 year.   ° ° ° ° °Medication °You will be discharged from the hospital with medication for pain, spasm, nausea and constipation.  You will be given enough medication to last until your first postop visit in 2 weeks.  Medications WILL NOT BE REFILLED EARLY; therefore, you are to take the medications only as directed.  If you have been given multiple prescriptions, please leave them with your pharmacy.  They can keep them on file for when you need them.  Medications that are lost or stolen WILL NOT be replaced.  We will address the need for continuing certain medications on an individual basis during your postop visit.  We ask that you avoid over the counter anti-inflammatory medications (Advil, Aleve, Motrin) for 3 months.   ° °What you can expect following neck surgery... °It is not uncommon to experience a sore throat or difficulty swallowing following neck surgery.  Cold liquids and soft foods are helpful in soothing this discomfort.  There is no specific diet that you are to follow after surgery, however, there are a   you should keep in mind to avoid unneeded discomfort.  Take small bites and eat slowly.  Chew your food thoroughly before swallowing.   It is not uncommon to experience incisional soreness or pain in the back of the neck, shoulders or between the shoulder blades.  These symptoms will slowly begin to resolve as you continue to recover, however, they can last for a few weeks.    It is not uncommon to experience INTERMITTENT arm pain following surgery.  This pain can mimic the arm pain you had prior to surgery.  As long as the pain resolves on its own and is not constant, there is no need to become alarmed.   When To  Call If you experience fever >101F, loss of bowel or bladder control, painful swelling in the lower extremities, constant (unresolving) arm pain.  If you experience any of these symptoms, please call [facility] [phone].  What's Next As mentioned earlier, you will follow up with Dr. Rolena Infante in 2 weeks.  At that time, we will likely remove your stitches and discuss additional aspects of your recovery.    RESTART PLAVIX ON 05/26/20.  CALL OFFICE IF YOU DEVELOP SWELLING, BLEEDING, DIFFICULTY BREATHING OR SWALLOWING.     ACTIVITY GUIDELINES ANTERIOR CERVICAL DISECTOMY AND FUSION  Activity Discharge 2 weeks 6 weeks 3 months 6 months 1 year  Shower 5 days        Submerge the wound  no no yes     Walking outdoors yes       Lifting 5 lbs yes       Climbing stairs yes       Cooking yes       Car rides (less than 30 minutes) yes       Car rides (greater than 30 minutes) no varies yes     Air travel no varies yes     Short outings [employer], visits, etc...) yes       School no no yes     Driving a car no no varies yes    Light upper extremity exercises no no varies yes    Stationary bike no no yes     Swimming (no diving) no no no varies yes   Vacuuming, laundry, mopping no no no varies yes   Biking outdoors no no no no varies yes  Light jogging no no no varies yes   Low impact aerobics no no no varies yes   Non-contact sports (tennis, golf) no no no varies yes   Hunting (no tree climbing) no no no varies yes   Dancing (non-gymnastics) no no no varies yes   Down-hill skiing (experienced skier) no no no no yes   Down-hill skiing (novice) no no no no yes   Cross-country skiing no no no no yes   Horseback riding (noncompetitive)  no no no no yes   Horseback riding (competitive) no no no no varies yes  Gardening/landscaping no no no varies yes   House repairs no no no varies varies yes  Lifting up to 50 lbs no no no no varies yes

## 2020-05-24 NOTE — Anesthesia Procedure Notes (Signed)
Procedure Name: Intubation Date/Time: 05/24/2020 2:46 PM Performed by: Reece Agar, CRNA Pre-anesthesia Checklist: Patient identified, Emergency Drugs available, Suction available and Patient being monitored Patient Re-evaluated:Patient Re-evaluated prior to induction Oxygen Delivery Method: Circle System Utilized Preoxygenation: Pre-oxygenation with 100% oxygen Induction Type: IV induction Ventilation: Mask ventilation without difficulty Laryngoscope Size: Glidescope and 3 Grade View: Grade I Tube type: Oral Tube size: 7.0 mm Number of attempts: 1 Airway Equipment and Method: Stylet Placement Confirmation: ETT inserted through vocal cords under direct vision,  positive ETCO2 and breath sounds checked- equal and bilateral Secured at: 22 cm Tube secured with: Tape Dental Injury: Teeth and Oropharynx as per pre-operative assessment

## 2020-05-24 NOTE — Transfer of Care (Signed)
Immediate Anesthesia Transfer of Care Note  Patient: [name]  Procedure(s) Performed: ANTERIOR CERVICAL DECOMPRESSION/DISCECTOMY FUSION 1 LEVEL C3-4 (N/A )  Patient Location: PACU  Anesthesia Type:General  Level of Consciousness: awake, alert  and patient cooperative  Airway & Oxygen Therapy: Patient Spontanous Breathing and Patient connected to face mask oxygen  Post-op Assessment: Report given to RN and Post -op Vital signs reviewed and stable  Post vital signs: Reviewed and stable  Last Vitals:  Vitals Value Taken Time  BP 134/76 05/24/20 1756  Temp    Pulse 89 05/24/20 1800  Resp 23 05/24/20 1800  SpO2 95 % 05/24/20 1800  Vitals shown include unvalidated device data.  Last Pain:  Vitals:   05/24/20 1124  TempSrc: Oral  PainSc: 8          Complications: No complications documented.

## 2020-05-24 NOTE — OR Nursing (Signed)
Surgeon and I discussed iodine allergy with patient - patient verbally agreed to its use during surgery.

## 2020-05-24 NOTE — Brief Op Note (Signed)
05/24/2020  5:37 PM  PATIENT:  [name]  70 y.o. female  PRE-OPERATIVE DIAGNOSIS:  Cervical Myelopathy C3-4  POST-OPERATIVE DIAGNOSIS:  Cervical Myelopathy C3-4  PROCEDURE:  Procedure(s) with comments: ANTERIOR CERVICAL DECOMPRESSION/DISCECTOMY FUSION 1 LEVEL C3-4 (N/A) - 3 hrs  SURGEON:  Surgeon(s) and Role:    Melina Schools, MD - Primary  PHYSICIAN ASSISTANT: Estill Bamberg Ward, PA  ANESTHESIA:   general  EBL:  25 mL   BLOOD ADMINISTERED:none  DRAINS: none   LOCAL MEDICATIONS USED:  MARCAINE     SPECIMEN:  No Specimen  DISPOSITION OF SPECIMEN:  N/A  COUNTS:  YES  TOURNIQUET:  * No tourniquets in log *  DICTATION: .Dragon Dictation  PLAN OF CARE: Admit for overnight observation  PATIENT DISPOSITION:  PACU - hemodynamically stable.

## 2020-05-24 NOTE — OR Nursing (Signed)
Discussed potential risks of keeping metal rings on finger during surgery with patient - patient informed me she also had this discussion with the anesthesiologist. Patient requested not to remove them if at all possible.

## 2020-05-25 ENCOUNTER — Encounter (HOSPITAL_COMMUNITY): Payer: Self-pay | Admitting: Orthopedic Surgery

## 2020-05-25 DIAGNOSIS — M4712 Other spondylosis with myelopathy, cervical region: Secondary | ICD-10-CM | POA: Diagnosis not present

## 2020-05-25 DIAGNOSIS — Z8673 Personal history of transient ischemic attack (TIA), and cerebral infarction without residual deficits: Secondary | ICD-10-CM | POA: Diagnosis not present

## 2020-05-25 DIAGNOSIS — I129 Hypertensive chronic kidney disease with stage 1 through stage 4 chronic kidney disease, or unspecified chronic kidney disease: Secondary | ICD-10-CM | POA: Diagnosis not present

## 2020-05-25 DIAGNOSIS — N183 Chronic kidney disease, stage 3 unspecified: Secondary | ICD-10-CM | POA: Diagnosis not present

## 2020-05-25 DIAGNOSIS — Z981 Arthrodesis status: Secondary | ICD-10-CM | POA: Diagnosis not present

## 2020-05-25 NOTE — Evaluation (Signed)
Occupational Therapy Evaluation Patient Details Name: [name] MRN: [id number] DOB: 12-08-1950 Today's Date: 05/25/2020    History of Present Illness Pt is a 70 y/o female s/p ACDF C3-4.  Complicated by fall 1/75 with R humerus fx, in sling NWB. PMH includes: anxiety, UTI, pulmonary embolism, pelvic fxs, insomina, OA, HTN, TIA, CVA.   Clinical Impression   PTA reports independent with ADLs, mobility using quad cane and driving.  Noted recent fall 3/16 with R humerus fracture requiring increased assist for ADLs over the last few days.  Patient admitted for above and limited by problem list below, including cervical precautions, pain, decreased functional use of dominant R UE and impaired balance. Pt is in a sling to R UE and NWB, reports managing sling on her own. Patient requires up to mod assist for ADLs, min guard for transfers and in room mobility given 1 HHA to simulate quad cane. Patient reports she will have 24/7 support at dc, initially at her daughter in laws home and then with her husband after a few days.   She was educated on ADL compensatory techniques, safety, precautions and brace.  She has all DME needed.  Will follow acutely to optimize independence and safety, but with good support at dc no follow up OT required at this time.    Follow Up Recommendations  No OT follow up;Supervision/Assistance - 24 hour    Equipment Recommendations  None recommended by OT    Recommendations for Other Services       Precautions / Restrictions Precautions Precautions: Fall;Cervical;Other (comment) Precaution Booklet Issued: Yes (comment) Precaution Comments: reviewed cervical precations, min cueing to adhere; R UE in sling and NWB Required Braces or Orthoses: Cervical Brace;Sling Cervical Brace: Hard collar;At all times Restrictions Weight Bearing Restrictions: Yes RUE Weight Bearing: Non weight bearing Other Position/Activity Restrictions: in sling      Mobility Bed  Mobility Overal bed mobility: Needs Assistance Bed Mobility: Rolling;Sidelying to Sit Rolling: Supervision Sidelying to sit: Min guard;HOB elevated       General bed mobility comments: required redirection for log roll, HOB slightly elevated and use of rail (has at home); min guard for trunk elevation but not phyiscal assist when exiting towards L side    Transfers Overall transfer level: Needs assistance Equipment used: 1 person hand held assist (uses quad cane at home) Transfers: Sit to/from Stand Sit to Stand: Min guard         General transfer comment: min guard for safety, steadying    Balance Overall balance assessment: Needs assistance Sitting-balance support: No upper extremity supported;Feet supported Sitting balance-Leahy Scale: Fair     Standing balance support: No upper extremity supported;Single extremity supported;During functional activity Standing balance-Leahy Scale: Poor Standing balance comment: relies on UE support                           ADL either performed or assessed with clinical judgement   ADL Overall ADL's : Needs assistance/impaired     Grooming: Set up;Sitting   Upper Body Bathing: Sitting;Moderate assistance   Lower Body Bathing: Moderate assistance;Sit to/from stand   Upper Body Dressing : Moderate assistance;Sitting   Lower Body Dressing: Moderate assistance;Sit to/from stand   Toilet Transfer: Min guard;Ambulation Toilet Transfer Details (indicate cue type and reason): hand held assist Toileting- Clothing Manipulation and Hygiene: Min guard;Sit to/from stand       Functional mobility during ADLs: Min guard (hand held assist) General ADL Comments:  pt educated on cervical precautions and ADL compensatory techniques due to cervical surgery and R humerus fx; will have support at home and voices understanding of techniques for ADL performance     Vision         Perception     Praxis      Pertinent Vitals/Pain  Pain Assessment: Faces Faces Pain Scale: Hurts even more Pain Location: incisional neck, and R humerus Pain Descriptors / Indicators: Discomfort;Grimacing;Guarding;Operative site guarding Pain Intervention(s): Limited activity within patient's tolerance;Monitored during session;Repositioned     Hand Dominance Right   Extremity/Trunk Assessment Upper Extremity Assessment Upper Extremity Assessment: RUE deficits/detail;LUE deficits/detail RUE Deficits / Details: recent humerus fx, in sling; able to wiggle fingers but reports decreased sensation in fingers RUE: Unable to fully assess due to immobilization RUE Sensation: decreased light touch RUE Coordination: decreased fine motor;decreased gross motor LUE Deficits / Details: functional, some decreased sensation in fingers LUE Sensation: decreased light touch   Lower Extremity Assessment Lower Extremity Assessment: Defer to PT evaluation   Cervical / Trunk Assessment Cervical / Trunk Assessment: Other exceptions Cervical / Trunk Exceptions: s/p cervical surgery   Communication Communication Communication: No difficulties   Cognition Arousal/Alertness: Awake/alert Behavior During Therapy: WFL for tasks assessed/performed Overall Cognitive Status: Within Functional Limits for tasks assessed                                 General Comments: some decreased recall, but reports baseline since CVA   General Comments       Exercises     Shoulder Instructions      Home Living Family/patient expects to be discharged to:: Private residence Living Arrangements: Spouse/significant other Available Help at Discharge: Family;Available 24 hours/day (daughter in Sales promotion account executive, then husband) Type of Home: House Home Access: Stairs to enter [employer] of Steps: 2   Home Layout: One level     Bathroom Shower/Tub: Teacher, early years/pre: Handicapped height     Home Equipment: Environmental consultant - 2 wheels;Cane  - quad;Shower seat;Grab bars - toilet;Grab bars - tub/shower;Toilet riser   Additional Comments: plans to go to daughter in laws home for the first 2 days then back to her home with husbad, will have 24/7 support      Prior Functioning/Environment Level of Independence: Independent with assistive device(s)        Comments: uses quad cane, independent with ADLs and driving. limited IADLs. recent fall with R humerus fx        OT Problem List: Decreased strength;Decreased activity tolerance;Impaired balance (sitting and/or standing);Decreased coordination;Decreased knowledge of use of DME or AE;Decreased knowledge of precautions;Pain;Impaired UE functional use;Impaired sensation;Increased edema      OT Treatment/Interventions: Self-care/ADL training;DME and/or AE instruction;Therapeutic activities;Patient/family education;Balance training    OT Goals(Current goals can be found in the care plan section) Acute Rehab OT Goals Patient Stated Goal: home OT Goal Formulation: With patient Time For Goal Achievement: 06/08/20 Potential to Achieve Goals: Good  OT Frequency: Min 2X/week   Barriers to D/C:            Co-evaluation              AM-PAC OT "6 Clicks" Daily Activity     Outcome Measure Help from another person eating meals?: A Little Help from another person taking care of personal grooming?: A Little Help from another person toileting, which includes using toliet, bedpan, or urinal?: A Little  Help from another person bathing (including washing, rinsing, drying)?: A Lot Help from another person to put on and taking off regular upper body clothing?: A Lot Help from another person to put on and taking off regular lower body clothing?: A Lot 6 Click Score: 15   End of Session Equipment Utilized During Treatment: Cervical collar;Other (comment) (sling) Nurse Communication: Mobility status;Precautions  Activity Tolerance: Patient tolerated treatment well Patient left: in  chair;with call bell/phone within reach  OT Visit Diagnosis: Other abnormalities of gait and mobility (R26.89);Muscle weakness (generalized) (M62.81);Pain;History of falling (Z91.81) Pain - Right/Left: Right Pain - part of body: Arm (cervical incisional)                Time: 7209-4709 OT Time Calculation (min): 34 min Charges:  OT General Charges $OT Visit: 1 Visit OT Evaluation $OT Eval Moderate Complexity: 1 Mod OT Treatments $Self Care/Home Management : 8-22 mins  Jolaine Artist, OT Acute Rehabilitation Services Pager [phone] Office [phone]   Delight Stare 05/25/2020, 10:09 AM

## 2020-05-25 NOTE — Progress Notes (Signed)
Subjective: 1 Day Post-Op Procedure(s) (LRB): ANTERIOR CERVICAL DECOMPRESSION/DISCECTOMY FUSION 1 LEVEL C3-4 (N/A) Patient reports pain as mild.   Improved left arm pain. Right arm pain du to humerus fracture.  Minimal difficulty swallowing. Tolerating PO without nausea or vomiting.  +ambulation +void, +flatus, -BM Denies CP, SOB, calf pain  Objective: Vital signs in last 24 hours: Temp:  [97.4 F (36.3 C)-98.4 F (36.9 C)] 98 F (36.7 C) (03/18 0730) Pulse Rate:  [74-90] 89 (03/18 0730) Resp:  [14-23] 18 (03/18 0730) BP: (110-151)/(62-79) 119/65 (03/18 0730) SpO2:  [92 %-100 %] 100 % (03/18 0730) Weight:  [79.4 kg] 79.4 kg (03/17 1124)  Intake/Output from previous day: 03/17 0701 - 03/18 0700 In: 1190 [P.O.:240; I.V.:800; IV Piggyback:150] Out: 425 [Urine:400; Blood:25] Intake/Output this shift: No intake/output data recorded.  Recent Labs    05/22/20 1538  HGB 13.6   Recent Labs    05/22/20 1538  WBC 11.5*  RBC 5.01  HCT 42.0  PLT 322   Recent Labs    05/22/20 1538  NA 140  K 3.9  CL 101  CO2 19*  BUN 14  CREATININE 1.13*  GLUCOSE 125*  CALCIUM 9.3   Recent Labs    05/22/20 1538  INR 1.0    Neurologically intact ABD soft Neurovascular intact Sensation intact distally Intact pulses distally Dorsiflexion/Plantar flexion intact Incision: no drainage No cellulitis present Compartment soft   Assessment/Plan: 1 Day Post-Op Procedure(s) (LRB): ANTERIOR CERVICAL DECOMPRESSION/DISCECTOMY FUSION 1 LEVEL C3-4 (N/A) Advance diet Up with therapy DVT PPx: Teds, SCDs. Ambulation. Will restart plavix Saturday  Plan D/C today when cleared by PT    Yvonne Kendall Bryony Kaman 05/25/2020, 7:40 AM

## 2020-05-25 NOTE — Progress Notes (Signed)
Patient is discharged from room 3C09 at this time. Alert and in stable condition. IV site d/c'd and instructions read to patient and son with understanding verbalized and all questions answered. Left unit via wheelchair with all belongings at side.  

## 2020-05-25 NOTE — Evaluation (Signed)
Physical Therapy Evaluation Patient Details Name: [name] MRN: [id number] DOB: [birth year] Today's Date: 05/25/2020   History of Present Illness  Pt is a 70 y/o female s/p ACDF C3-4.  Complicated by fall 7/49 with R humerus fx, in sling NWB. PMH includes: anxiety, UTI, pulmonary embolism, pelvic fxs, insomina, OA, HTN, TIA, CVA.    Clinical Impression  Pt received in chair, cooperative and pleasant. Able to verbally state cervical and R UE NWB precautions. Handout provided on precautions and education on car transfers reviewed. Generally min guard for safety during mobility, no LOB. Pt typically uses cane, but unable to find one on the floor. Suspect pt will be more steady with use of her quad cane. Pt would benefit from PT when cleared by [place]regon to address strength, balance, and gait. Recommend OP PT. Pt left in chair with all needs met and call bell within reach.    Follow Up Recommendations Outpatient PT    Equipment Recommendations  None recommended by PT    Recommendations for Other Services       Precautions / Restrictions Precautions Precautions: Fall;Cervical;Other (comment) Precaution Booklet Issued: Yes (comment) Precaution Comments: reviewed cervical precations; R UE in sling and NWB Required Braces or Orthoses: Cervical Brace;Sling Cervical Brace: Hard collar;At all times Restrictions Weight Bearing Restrictions: Yes RUE Weight Bearing: Non weight bearing Other Position/Activity Restrictions: in sling      Mobility  Bed Mobility     General bed mobility comments: Pt received sitting in chair. Pt able to recite that she should use log roll technique for bed mobility and to roll towards L side    Transfers Overall transfer level: Needs assistance Equipment used: None Transfers: Sit to/from Stand Sit to Stand: Min guard         General transfer comment: min guard for safety, steadying  Ambulation/Gait Ambulation/Gait assistance: Min guard Gait  Distance (Feet): 200 Feet Assistive device: None Gait Pattern/deviations: Step-through pattern;Decreased stride length;Drifts right/left Gait velocity: decreased   General Gait Details: Min guard for safety, no LOB. Pt reports that without her cane, she drifts a little more than usual  Stairs Stairs: Yes Stairs assistance: Min guard Stair Management: One rail Right;Forwards Number of Stairs: 2 General stair comments: Min guard for safety, no physical assist given  Wheelchair Mobility    Modified Rankin (Stroke Patients Only)       Balance Overall balance assessment: Needs assistance Sitting-balance support: No upper extremity supported;Feet supported Sitting balance-Leahy Scale: Fair     Standing balance support: No upper extremity supported;Single extremity supported;During functional activity Standing balance-Leahy Scale: Fair Standing balance comment: Benefits from UE support                             Pertinent Vitals/Pain Pain Assessment: 0-10 Pain Score: 5  Faces Pain Scale: Hurts even more Pain Location: incisional neck, and R humerus Pain Descriptors / Indicators: Discomfort;Grimacing;Guarding;Operative site guarding Pain Intervention(s): Limited activity within patient's tolerance;Monitored during session    [place] expects to be discharged to:: Private residence Living Arrangements: Spouse/significant other Available Help at Discharge: Family;Available 24 hours/day (daughter in Sales promotion account executive, then husband) Type of Home: House Home Access: Stairs to enter Entrance Stairs-Rails: Can reach both;Left;Right Entrance Stairs-Number of Steps: 2 Home Layout: One level Home Equipment: Walker - 2 wheels;Cane - quad;Shower seat;Grab bars - toilet;Grab bars - tub/shower;Toilet riser Additional Comments: plans to go to daughter in laws home for the first  2 days then back to her home with husbad, will have 24/7 support    Prior Function  Level of Independence: Independent with assistive device(s)         Comments: uses quad cane. recent fall with R humerus fx     Hand Dominance   Dominant Hand: Right    Extremity/Trunk Assessment   Upper Extremity Assessment Upper Extremity Assessment: Defer to OT evaluation RUE Deficits / Details: recent humerus fx, in sling; able to wiggle fingers but reports decreased sensation in fingers RUE: Unable to fully assess due to immobilization RUE Sensation: decreased light touch RUE Coordination: decreased fine motor;decreased gross motor LUE Deficits / Details: functional, some decreased sensation in fingers LUE Sensation: decreased light touch    Lower Extremity Assessment Lower Extremity Assessment: Overall WFL for tasks assessed    Cervical / Trunk Assessment Cervical / Trunk Assessment: Other exceptions Cervical / Trunk Exceptions: s/p cervical surgery  Communication   Communication: No difficulties  Cognition Arousal/Alertness: Awake/alert Behavior During Therapy: WFL for tasks assessed/performed Overall Cognitive Status: Within Functional Limits for tasks assessed                                 General Comments: some decreased recall, but reports baseline since CVA      General Comments General comments (skin integrity, edema, etc.): Benefits from UE support, typically uses cane, was reaching out for furniture in room    Exercises     Assessment/Plan    PT Assessment Patient needs continued PT services  PT Problem List Decreased strength;Decreased range of motion;Decreased activity tolerance;Decreased knowledge of use of DME;Decreased balance;Decreased mobility;Decreased coordination;Pain;Decreased knowledge of precautions;Decreased safety awareness       PT Treatment Interventions DME instruction;Balance training;Gait training;Neuromuscular re-education;Stair training;Patient/family education;Functional mobility training;Therapeutic  activities;Therapeutic exercise    PT Goals (Current goals can be found in the Care Plan section)  Acute Rehab PT Goals Patient Stated Goal: return home PT Goal Formulation: With patient Time For Goal Achievement: 06/14/20 Potential to Achieve Goals: Good    Frequency Min 5X/week   Barriers to discharge        Co-evaluation               AM-PAC PT "6 Clicks" Mobility  Outcome Measure Help needed turning from your back to your side while in a flat bed without using bedrails?: A Little Help needed moving from lying on your back to sitting on the side of a flat bed without using bedrails?: A Little Help needed moving to and from a bed to a chair (including a wheelchair)?: None Help needed standing up from a chair using your arms (e.g., wheelchair or bedside chair)?: None Help needed to walk in hospital room?: A Little Help needed climbing 3-5 steps with a railing? : A Little 6 Click Score: 20    End of Session Equipment Utilized During Treatment: Gait belt;Cervical collar;Other (comment) (R UE in sling) Activity Tolerance: Patient tolerated treatment well Patient left: in chair;with call bell/phone within reach Nurse Communication: Mobility status PT Visit Diagnosis: Unsteadiness on feet (R26.81);History of falling (Z91.81)    Time:  -      Charges:         Rosita Kea, SPT

## 2020-05-25 NOTE — Anesthesia Postprocedure Evaluation (Signed)
Anesthesia Post Note  Patient: ALAYSSA FLINCHUM  Procedure(s) Performed: ANTERIOR CERVICAL DECOMPRESSION/DISCECTOMY FUSION 1 LEVEL C3-4 (N/A )     Patient location during evaluation: PACU Anesthesia Type: General Level of consciousness: awake and alert Pain management: pain level controlled Vital Signs Assessment: post-procedure vital signs reviewed and stable Respiratory status: spontaneous breathing, nonlabored ventilation, respiratory function stable and patient connected to nasal cannula oxygen Cardiovascular status: blood pressure returned to baseline and stable Postop Assessment: no apparent nausea or vomiting Anesthetic complications: no   No complications documented.  Last Vitals:  Vitals:   05/25/20 0005 05/25/20 0308  BP: 110/79 (!) 151/75  Pulse: 86 82  Resp: 18 18  Temp: 36.6 C 36.6 C  SpO2: 93% 98%    Last Pain:  Vitals:   05/25/20 0630  TempSrc:   PainSc: 7                  Catalina Gravel

## 2020-05-28 NOTE — Discharge Summary (Signed)
Patient ID: [name] MRN: [id number] DOB/AGE: 03-15-1950 70 y.o.  Admit date: 05/24/2020 Discharge date: 05/28/2020  Admission Diagnoses:  Active Problems:   Cervical myelopathy [facility])   Discharge Diagnoses:  Active Problems:   Cervical myelopathy (HCC)  status post Procedure(s): ANTERIOR CERVICAL DECOMPRESSION/DISCECTOMY FUSION 1 LEVEL C3-4  Past Medical History:  Diagnosis Date  . Anemia   . Cervical neuritis   . Chronic kidney disease    stage 3  . Dyslipidemia   . GERD (gastroesophageal reflux disease)   . History of transient ischemic attack (TIA)   . HTN (hypertension)   . Hyperlipidemia   . Migraine   . Migraine headache   . Occipital neuralgia    Left  . Pulmonary embolism ([place]) 2017  . Renal insufficiency   . Stroke [facility]) 10/2013    Surgeries: Procedure(s): ANTERIOR CERVICAL DECOMPRESSION/DISCECTOMY FUSION 1 LEVEL C3-4 on 05/24/2020   Consultants:   Discharged Condition: Improved  Hospital Course: [name] is an 70 y.o. female who was admitted 05/24/2020 for operative treatment of Cervical myelopathy. Patient failed conservative treatments (please see the history and physical for the specifics) and had severe unremitting pain that affects sleep, daily activities and work/hobbies. After pre-op clearance, the patient was taken to the operating room on 05/24/2020 and underwent  Procedure(s): ANTERIOR CERVICAL DECOMPRESSION/DISCECTOMY FUSION 1 LEVEL C3-4.    Patient was given perioperative antibiotics:  Anti-infectives (From admission, onward)   Start     Dose/Rate Route Frequency Ordered Stop   05/24/20 2230  ceFAZolin (ANCEF) IVPB 1 g/50 mL premix        1 g 100 mL/hr over 30 Minutes Intravenous Every 8 hours 05/24/20 1947 05/25/20 0832   05/24/20 1037  ceFAZolin (ANCEF) IVPB 2g/100 mL premix        2 g 200 mL/hr over 30 Minutes Intravenous 30 min pre-op 05/24/20 1038 05/24/20 1502       Patient was given sequential compression devices  and early ambulation to prevent DVT.   Patient benefited maximally from hospital stay and there were no complications. At the time of discharge, the patient was urinating/moving their bowels without difficulty, tolerating a regular diet, pain is controlled with oral pain medications and they have been cleared by PT/OT.   Recent vital signs: No data found.   Recent laboratory studies: No results for input(s): WBC, HGB, HCT, PLT, NA, K, CL, CO2, BUN, CREATININE, GLUCOSE, INR, CALCIUM in the last 72 hours.  Invalid input(s): PT, 2   Discharge Medications:   Allergies as of 05/25/2020      Reactions   Shellfish Allergy Anaphylaxis   All seafood   Iodine    Statins Other (See Comments)   Arthralgias (severe) with atorvastatin, mild arthralgias with rosuvastatin but willing to continue taking it      Medication List    STOP taking these medications   acetaminophen 500 MG tablet Commonly known as: TYLENOL   calcium carbonate 1250 (500 Ca) MG tablet Commonly known as: OS-CAL - dosed in mg of elemental calcium   clopidogrel 75 MG tablet Commonly known as: PLAVIX   ELDERBERRY PO   ergocalciferol 1.25 MG (50000 UT) capsule Commonly known as: VITAMIN D2   Hair/Skin/Nails/Biotin Tabs   tiZANidine 2 MG tablet Commonly known as: ZANAFLEX   traMADol 50 MG tablet Commonly known as: ULTRAM     TAKE these medications   albuterol 108 (90 Base) MCG/ACT inhaler Commonly known as: VENTOLIN HFA Inhale 2 puffs into the lungs  every 6 (six) hours as needed for wheezing or shortness of breath.   amLODipine 10 MG tablet Commonly known as: NORVASC Take 1 tablet (10 mg total) by mouth daily.   cetirizine 10 MG tablet Commonly known as: ZYRTEC Take 10 mg by mouth daily with breakfast.   doxepin 100 MG capsule Commonly known as: SINEQUAN Take 2 capsules (200 mg total) by mouth at bedtime.   ezetimibe 10 MG tablet Commonly known as: ZETIA Take 10 mg by mouth daily.   fluticasone 50  MCG/ACT nasal spray Commonly known as: FLONASE Place 1 spray into both nostrils daily. What changed:   when to take this  reasons to take this   furosemide 40 MG tablet Commonly known as: LASIX Take 40 mg by mouth daily.   gabapentin 300 MG capsule Commonly known as: NEURONTIN Take 600 mg by mouth 2 (two) times daily.   methocarbamol 500 MG tablet Commonly known as: Robaxin Take 1 tablet (500 mg total) by mouth every 8 (eight) hours as needed for up to 5 days for muscle spasms.   ondansetron 4 MG tablet Commonly known as: Zofran Take 1 tablet (4 mg total) by mouth every 8 (eight) hours as needed for nausea or vomiting.   oxyCODONE-acetaminophen 10-325 MG tablet Commonly known as: Percocet Take 1 tablet by mouth every 6 (six) hours as needed for up to 5 days for pain.   pantoprazole 40 MG tablet Commonly known as: PROTONIX TAKE 1 TABLET(40 MG) BY MOUTH DAILY WITH BREAKFAST What changed: See the new instructions.   sertraline 50 MG tablet Commonly known as: ZOLOFT TAKE 1 TABLET(50 MG) BY MOUTH DAILY What changed: See the new instructions.   traZODone 50 MG tablet Commonly known as: DESYREL TAKE 1/2 TO 1 TABLET(25 TO 50 MG) BY MOUTH AT BEDTIME AS NEEDED FOR SLEEP What changed: See the new instructions.   zonisamide 25 MG capsule Commonly known as: ZONEGRAN take 25 mg twice daily prn in addition to 100 mg twice daily. What changed:   how much to take  how to take this  when to take this   zonisamide 100 MG capsule Commonly known as: ZONEGRAN TAKE 1 CAPSULE(100 MG) BY MOUTH TWICE DAILY What changed:   how much to take  how to take this  when to take this       Diagnostic Studies: DG Chest 1 View  Result Date: 05/24/2020 CLINICAL DATA:  Preop EXAM: CHEST  1 VIEW COMPARISON:  07/31/2019 FINDINGS: Left base scarring. No confluent opacity on the right. Heart is normal size. No effusions or acute bony abnormality. IMPRESSION: No active disease.  Electronically Signed   By: Rolm Baptise M.D.   On: 05/24/2020 11:23   DG Cervical Spine 1 View  Result Date: 05/24/2020 CLINICAL DATA:  C3-4 ACDF EXAM: DG CERVICAL SPINE - 1 VIEW COMPARISON:  CT 07/18/2019 FINDINGS: Portable AP intraoperative view of the cervical spine. Cervical spine hardware is noted. Endotracheal tube tip at the superior margin of clavicles. Vascular calcifications in the carotid arteries. No unexpected radiopaque foreign bodies are seen. IMPRESSION: No unexpected radiopaque foreign body is seen. Critical Value/emergent results were called by telephone at the time of interpretation on 05/24/2020 at 5:42 pm to provider Jenn in [place] [facility] for [facility] , who verbally acknowledged these results. Electronically Signed   By: Donavan Foil M.D.   On: 05/24/2020 17:45   DG Cervical Spine 2-3 Views  Result Date: 05/24/2020 CLINICAL DATA:  C3-C4 ACDF EXAM: CERVICAL SPINE -  2-3 VIEW; DG C-ARM 1-60 MIN COMPARISON:  None. FINDINGS: Six fluoroscopic spot views obtained in the operating room in frontal and lateral projections. Initial lateral views demonstrates surgical instrument localizing to C3-C4 disc space. There is prior fusion hardware at C4-C5. Subsequent anterior C3-C4 fusion. Total fluoroscopy time 37 seconds. IMPRESSION: Intraoperative fluoroscopy during C3-C4 fusion. Electronically Signed   By: Keith Rake M.D.   On: 05/24/2020 17:55   DG C-Arm 1-60 Min  Result Date: 05/24/2020 CLINICAL DATA:  C3-C4 ACDF EXAM: CERVICAL SPINE - 2-3 VIEW; DG C-ARM 1-60 MIN COMPARISON:  None. FINDINGS: Six fluoroscopic spot views obtained in the operating room in frontal and lateral projections. Initial lateral views demonstrates surgical instrument localizing to C3-C4 disc space. There is prior fusion hardware at C4-C5. Subsequent anterior C3-C4 fusion. Total fluoroscopy time 37 seconds. IMPRESSION: Intraoperative fluoroscopy during C3-C4 fusion. Electronically Signed   By: Keith Rake M.D.   On:  05/24/2020 17:55    Discharge Instructions    Incentive spirometry RT   Complete by: As directed        Follow-up Information    Melina Schools, MD. Schedule an appointment as soon as possible for a visit in 2 weeks.   Specialty: Orthopedic Surgery Why: If symptoms worsen, For suture removal, For wound re-check Contact information: [address] [place] [address] [phone]               Discharge Plan:  discharge to home  Disposition: stable    Signed: Yvonne Kendall Ward for [facility] PA-C [facility] [phone] 05/28/2020, 4:54 PM

## 2020-05-30 DIAGNOSIS — S42291D Other displaced fracture of upper end of right humerus, subsequent encounter for fracture with routine healing: Secondary | ICD-10-CM | POA: Diagnosis not present

## 2020-06-04 ENCOUNTER — Telehealth: Payer: Self-pay | Admitting: Internal Medicine

## 2020-06-04 NOTE — Telephone Encounter (Signed)
Left message to call back to discuss Praluent Unsure if she is still taking - not on med list Did not see recent lipid profile If still taking Praluent, would need this repeated

## 2020-06-13 DIAGNOSIS — S42291D Other displaced fracture of upper end of right humerus, subsequent encounter for fracture with routine healing: Secondary | ICD-10-CM | POA: Diagnosis not present

## 2020-06-15 ENCOUNTER — Other Ambulatory Visit: Payer: Self-pay | Admitting: Student in an Organized Health Care Education/Training Program

## 2020-06-18 NOTE — Telephone Encounter (Signed)
MyChart message sent to patient with same request for information

## 2020-06-19 ENCOUNTER — Telehealth: Payer: Self-pay | Admitting: Family Medicine

## 2020-06-19 ENCOUNTER — Encounter: Payer: Self-pay | Admitting: *Deleted

## 2020-06-19 MED ORDER — GABAPENTIN 300 MG PO CAPS: 600.0000 mg | ORAL_CAPSULE | Freq: Two times a day (BID) | ORAL | 2 refills | Status: DC

## 2020-06-19 NOTE — Telephone Encounter (Signed)
Pt is requesting a refill for gabapentin (NEURONTIN) 300 MG capsule.  Pharmacy: [facility] [phone]

## 2020-06-19 NOTE — Addendum Note (Signed)
Addended by: Minna Antis on: 06/19/2020 02:25 PM   Modules accepted: Orders

## 2020-06-21 ENCOUNTER — Other Ambulatory Visit: Payer: Medicare Other

## 2020-06-22 IMAGING — CT CT L SPINE W/ CM
1 of 6 series · 6 of 14 positions shown, 8 images · IV contrast (omnipaque)
Comparison: CT cervical 05/14/2019, MR lumbar 05/18/2019

CLINICAL DATA: Acute midline low back pain. Previous cervical
discectomy.

EXAM:
MYELOGRAM - 2+REGIONS
CT LUMBAR SPINE WITH CONTRAST
CT CERVICAL SPINE WITH CONTRAST
TECHNIQUE: The procedure, risks (including but not limited to bleeding,
infection, organ damage ), benefits, and alternatives were explained
to the patient. Questions regarding the procedure were encouraged
and answered. The patient understands and consents to the procedure.
An appropriate entry site was determined under fluoroscopy. Operator
donned sterile gloves and mask. Skin site was marked, prepped with
Betadine, and draped in usual sterile fashion, and infiltrated
locally with 1% lidocaine. I initially approached at L3 but could
not obtain CSF. A22 gauge spinal needle was advanced into the thecal
sac at L2 from a right interlaminar approach. Clear colorless CSF
returned. 10 ml Omnipaque 300 were administered intrathecally for
lumbar and cervical myelography, followed by axial CT scanning of
the cervical and lumbar spine. I personally performed the lumbar
puncture and administered the intrathecal contrast. I also
personally supervised acquisition of the myelogram images. Coronal
and sagittal reconstructions were generated from the axial scan.

[Series 3: l spine soft · axial · 0.34mm/px · z∈[-583,-433]mm · 6 of 71 slices shown, 8 images]
[im 11/71  soft-tissue]
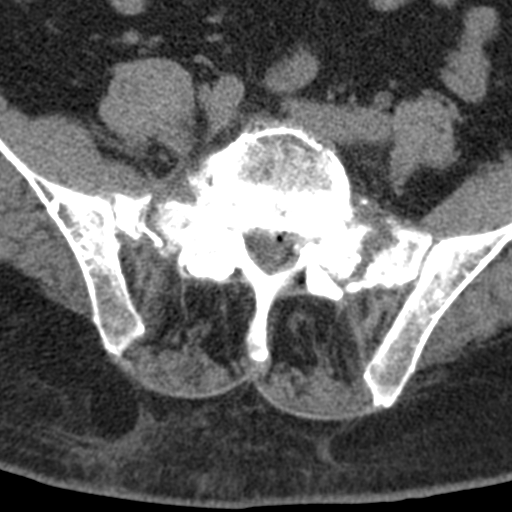
[im 11/71  bone]
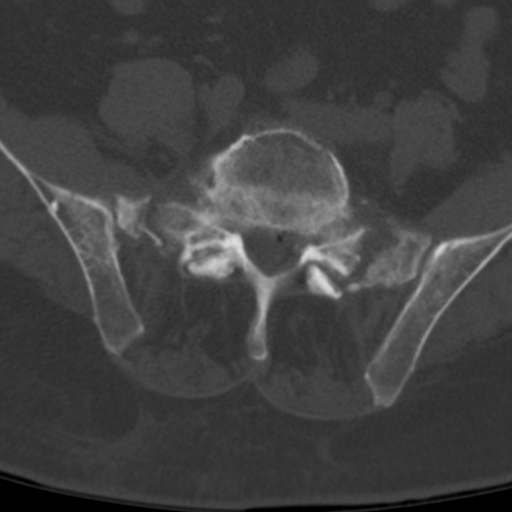
[im 21/71  bone]
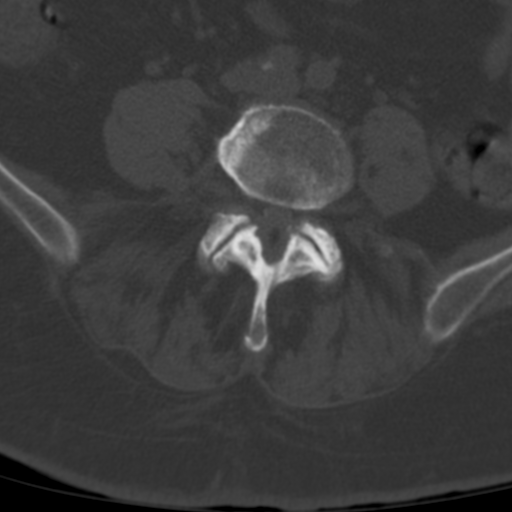
[im 31/71  bone]
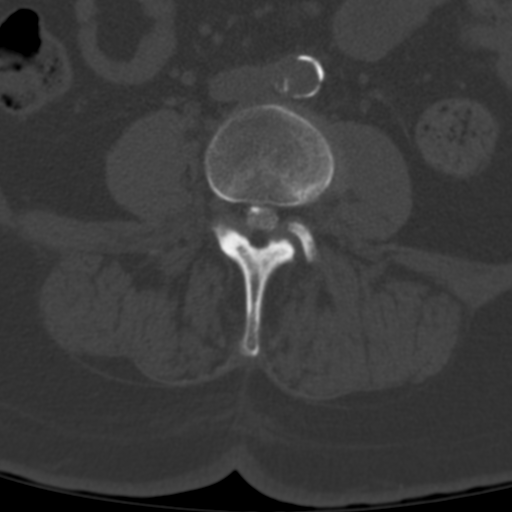
[im 41/71  bone]
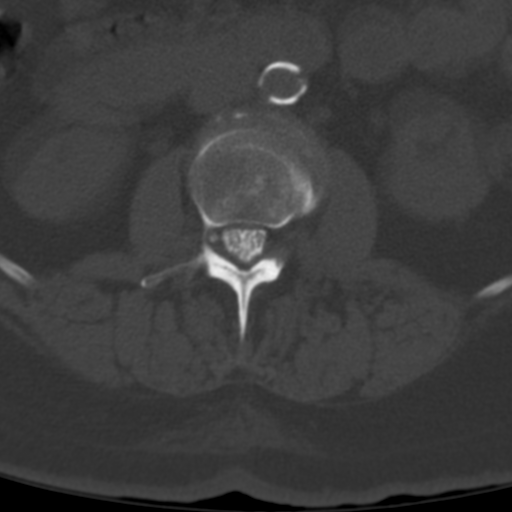
[im 51/71  soft-tissue]
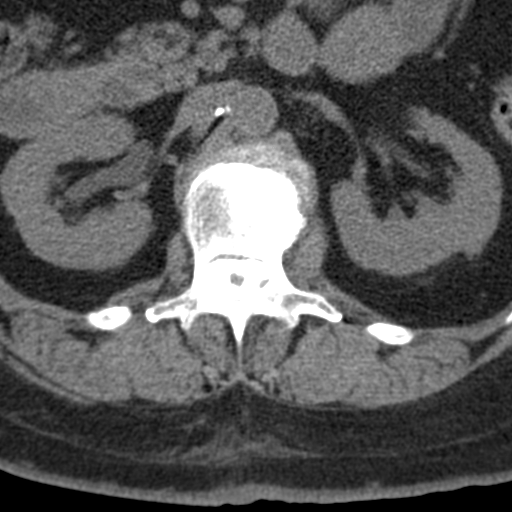
[im 51/71  bone]
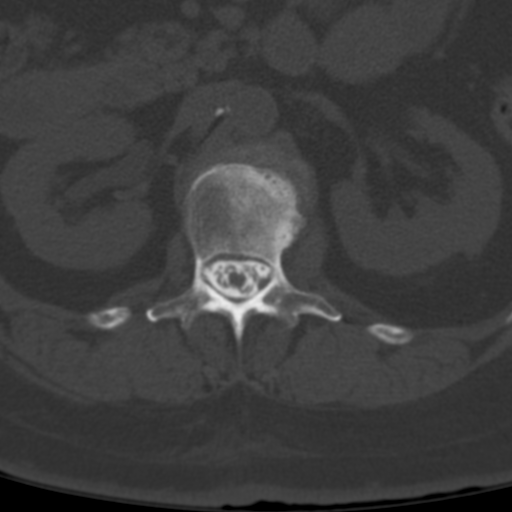
[im 61/71  bone]
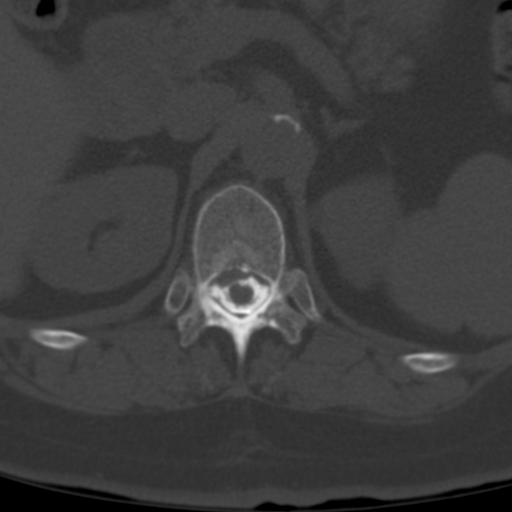

[6 of 14 positions shown; findings below may reference images not displayed]

FINDINGS: CERVICAL FINDINGS

Normal alignment. No prevertebral soft tissue swelling. Negative for
fracture. Bilateral calcified carotid arterial plaque.

C2-3: Mild broad disc bulge with minimal flattening of the anterior
cord, mild stenosis. Mild bilateral facet DJD. No osseous foraminal
stenosis.

C3-4: Mild broad disc bulge with moderate narrowing of the cord in
the AP dimension. Moderate spinal stenosis. Mild narrowing of the
interspace. Left facet DJD. Foramina patent.

C4-5: Solid-appearing interbody ACDF. Thecal sac is capacious.
Foramina patent.

C5-6: Mild narrowing of the interspace with solid-appearing
interbody fusion. Posterior osteophytic ridging with minimal
flattening the anterior aspect of the cervical cord and mild
stenosis. Bilateral foraminal encroachment by uncovertebral spurs.

C6-7: Solid interbody fusion. No spinal stenosis or cord distortion.
Foramina patent

C7-T1: Interspace unremarkable. No spinal stenosis. Foramina patent.

LUMBAR FINDINGS

5 non rib-bearing lumbar segments assigned L1-L5 as before. There is
a very mild thoracolumbar dextroscoliosis apex L1 without evident
underlying vertebral anomaly. No evident dynamic instability on
standing lateral flexion/extension radiographs.

T11-12: Right posterolateral protrusion with some migration caudal
and cephalad. No cord distortion or spinal stenosis. Foramina
patent.

T12-L1: Mild narrowing of the interspace with vacuum phenomenon.
Partially calcified right posterior protrusion with mild cord
distortion, no spinal stenosis. Foramina patent. Stable mild
compression deformity of L1 without retropulsion.

L1-2: Moderate asymmetric narrowing of the interspace, left greater
than right, with vacuum phenomenon. 2 mm retrolisthesis of L1 on L2.
Broad posterior disc bulge. Mild bilateral facet hypertrophy
contributing to mild multifactorial spinal stenosis and left
foraminal stenosis. Conus terminates at the interspace.

L2-3: Mild narrowing of the interspace with vacuum phenomenon.
Moderate circumferential disc bulge. Mild bilateral facet DJD
contributing to moderate multifactorial spinal stenosis. Foramina
patent.

L3-4: Mild narrowing of the interspace with central vacuum
phenomena. Circumferential disc bulge with probable right
superimposed protrusion extending caudally behind the L4 body.
Bilateral facet DJD and some thickening of ligamentum flavum
resulting in mild-to-moderate spinal stenosis. There is poor caudal
extension of the intrathecal contrast. No definite foraminal
stenosis.

L4-5: Mild asymmetric narrowing of the interspace right greater than
left with vacuum phenomena. Bilateral facet DJD with thickening of
ligamentum flavum contributing to at least moderate spinal stenosis.
Poor caudal extension of the intrathecal contrast limiting
assessment. Bilateral foraminal encroachment.

L5-S1: Moderate narrowing of the interspace with extensive vacuum
phenomenon. At least mild circumferential disc bulge. Moderate
bilateral facet DJD. Poor intrathecal enhancement limiting
assessment of spinal stenosis. Bilateral osseous foraminal stenosis.

Bilateral unhealed sacral insufficiency type fractures with some
resorption along the fracture lines bilaterally.

Extensive calcified aortoiliac arterial atheromatous plaque without
aneurysm. Cholecystectomy clips.
IMPRESSION: 1. Bilateral unhealed sacral insufficiency fractures.
2. Solid-appearing interbody fusion C4-C7.
3. Adjacent level disease C3-4 with broad disc bulge and moderate
stenosis.
4. Multilevel lumbar spondylitic changes as detailed above.
Myelographic block secondary to moderate stenosis L2-3, L3-4 and
L4-5.
5. Chronic L1 compression deformity.
6.  Aortic Atherosclerosis ([id number]-170.0).

## 2020-06-27 ENCOUNTER — Other Ambulatory Visit: Payer: Self-pay | Admitting: Cardiovascular Disease

## 2020-07-03 DIAGNOSIS — Z4889 Encounter for other specified surgical aftercare: Secondary | ICD-10-CM | POA: Diagnosis not present

## 2020-07-03 DIAGNOSIS — S42201S Unspecified fracture of upper end of right humerus, sequela: Secondary | ICD-10-CM | POA: Diagnosis not present

## 2020-07-05 DIAGNOSIS — M25511 Pain in right shoulder: Secondary | ICD-10-CM | POA: Diagnosis not present

## 2020-07-05 DIAGNOSIS — M542 Cervicalgia: Secondary | ICD-10-CM | POA: Diagnosis not present

## 2020-07-09 DIAGNOSIS — M25511 Pain in right shoulder: Secondary | ICD-10-CM | POA: Diagnosis not present

## 2020-07-09 DIAGNOSIS — M542 Cervicalgia: Secondary | ICD-10-CM | POA: Diagnosis not present

## 2020-07-13 DIAGNOSIS — M25511 Pain in right shoulder: Secondary | ICD-10-CM | POA: Diagnosis not present

## 2020-07-16 DIAGNOSIS — M25511 Pain in right shoulder: Secondary | ICD-10-CM | POA: Diagnosis not present

## 2020-07-17 ENCOUNTER — Other Ambulatory Visit: Payer: Self-pay | Admitting: Student in an Organized Health Care Education/Training Program

## 2020-07-17 ENCOUNTER — Telehealth: Payer: Self-pay | Admitting: *Deleted

## 2020-07-17 DIAGNOSIS — M7989 Other specified soft tissue disorders: Secondary | ICD-10-CM

## 2020-07-17 DIAGNOSIS — R0602 Shortness of breath: Secondary | ICD-10-CM

## 2020-07-17 NOTE — Telephone Encounter (Signed)
Increase lasix to 80mg  bid for 3 days then back to 40mg  bid.  Check BMP and BNP at the endo of the week.

## 2020-07-17 NOTE — Telephone Encounter (Signed)
Patient walked in to office complaining of BL LE edema x 1.5 weeks.  Swelling noted to BL LE feet and lower legs.  Reports weighing at home daily and reports being up 10 lbs from baseline.  Does not wear compression stockings, no increase in salt intake.   Reports intermittent SOB, no SOB at current or distress noted at time.   She reports swelling does not decrease with elevation at night.   Reports having surgery on 3/18 and has been less mobile since surgery.    She states she has increased her lasix to 40 BID for the last 1.5 week without any change.    Advised to use compression stockings if able, monitor salt intake closely and appt scheduled for first available-5/31 with Sande Rives PA.    Advised patient will send message to MD and PA to review for additional changes/recommendations.   Patient is aware of symptoms progress or worsen to proceed to ER for evaluation.

## 2020-07-18 NOTE — Telephone Encounter (Signed)
Left message to call back  

## 2020-07-20 ENCOUNTER — Other Ambulatory Visit: Payer: Self-pay | Admitting: Cardiovascular Disease

## 2020-07-20 ENCOUNTER — Telehealth: Payer: Self-pay | Admitting: Physician Assistant

## 2020-07-20 DIAGNOSIS — M7989 Other specified soft tissue disorders: Secondary | ICD-10-CM | POA: Diagnosis not present

## 2020-07-20 DIAGNOSIS — M542 Cervicalgia: Secondary | ICD-10-CM | POA: Diagnosis not present

## 2020-07-20 DIAGNOSIS — M25511 Pain in right shoulder: Secondary | ICD-10-CM | POA: Diagnosis not present

## 2020-07-20 DIAGNOSIS — R0602 Shortness of breath: Secondary | ICD-10-CM | POA: Diagnosis not present

## 2020-07-20 LAB — SPECIMEN STATUS REPORT

## 2020-07-20 NOTE — Addendum Note (Signed)
Addended by: Alvina Filbert B on: 07/20/2020 09:28 AM   Modules accepted: Orders

## 2020-07-20 NOTE — Telephone Encounter (Signed)
Received call to answering service from [place] as BMET/BNP were ordered stat. Labs reviewed - K 3.5, Cr 1.18, values consistent with prior. Per phone note, patient called in with increasing edema/weight gain. She had been taking Lasix 40mg  BID. On 07/17/20, she was advised to increase to 80mg  x 3 days then back to 40mg  BID with BMET/BNP today. BNP not crossing over but was 20.1 per [facility] rep. As labs are similar to prior values, will forward to MD box for review.

## 2020-07-20 NOTE — Telephone Encounter (Signed)
Patient walked in, reviewed information and she will have labs while she is here

## 2020-07-21 LAB — BASIC METABOLIC PANEL

## 2020-07-21 LAB — BRAIN NATRIURETIC PEPTIDE: BNP: 20.1 pg/mL (ref 0.0–100.0)

## 2020-07-23 ENCOUNTER — Ambulatory Visit: Payer: Medicare Other

## 2020-07-23 DIAGNOSIS — M25511 Pain in right shoulder: Secondary | ICD-10-CM | POA: Diagnosis not present

## 2020-07-23 DIAGNOSIS — M542 Cervicalgia: Secondary | ICD-10-CM | POA: Diagnosis not present

## 2020-07-23 NOTE — Progress Notes (Signed)
    SUBJECTIVE:   CHIEF COMPLAINT / HPI:   Leg swelling: patient complaining of worsening leg swelling over the past 3 weeks. She has associated pain over the anterior shin and foot.  During that time she briefly increaed her lasix to 80mg  Bid for 3 days on her cardiologist recommendations.  This did not help her swelling.  She is not having dyspnea.  Whether it is 40mg  or 80mg , the lasix still makes her urinate more frequently in the first hour after taking it. She recently increased her amlodipine to 10mg  from 5mg  around 3-4 weeks ago.  She has compression stockings but cannot get them on her feet due to her arm injury.    PERTINENT  PMH / PSH: CAD, htn  OBJECTIVE:   BP 128/64   Pulse (!) 110   Wt 191 lb 12.8 oz (87 kg)   SpO2 98%   BMI 30.04 kg/m   Gen: alert, oriented.  CV: RRR, no murmurs.  Pulm: lctab. No wheezes or crackles.  Ext: nonpitting edema over the legs and feet bilaterally below the knee.  Skin is taut and shiny.  Some hyperpigmentation on the shins.    ASSESSMENT/PLAN:   Leg swelling Not due to CHF.  Recent BNP was ~20.  Likely 2/2 chronic venous insufficiency with possibly her amlodipine contributing given the recent increase in dose.  Advised her to contact her cardiologist regarding changing the amlodipine dose if necessary.  Recommended compression stockings if she can somehow get someone to help her put them on.  Adjusting lasix unlikely to help given that she responds to the 40mg  and did not notice an improvement when increasing to 80mg .      Benay Pike, MD [place]

## 2020-07-24 ENCOUNTER — Ambulatory Visit (INDEPENDENT_AMBULATORY_CARE_PROVIDER_SITE_OTHER): Payer: Medicare Other | Admitting: Family Medicine

## 2020-07-24 ENCOUNTER — Other Ambulatory Visit: Payer: Self-pay

## 2020-07-24 DIAGNOSIS — M7989 Other specified soft tissue disorders: Secondary | ICD-10-CM | POA: Diagnosis not present

## 2020-07-24 DIAGNOSIS — I2510 Atherosclerotic heart disease of native coronary artery without angina pectoris: Secondary | ICD-10-CM | POA: Diagnosis not present

## 2020-07-24 DIAGNOSIS — I2584 Coronary atherosclerosis due to calcified coronary lesion: Secondary | ICD-10-CM

## 2020-07-24 NOTE — Patient Instructions (Signed)
It was nice to see you today,  I do not think your swelling is due to heart disease.  I think it may be a combination of venous incompetence or your recent change in amlodipine dose.  I would recommend you talk to the cardiologist about changing that amlodipine dose or the Lasix dose, although I do not think changing the Lasix dose would be helpful since it did not help earlier when he did that.  You can also use compression stockings.  I know they are difficult to put on with your arm fracture, but if you can have somebody help you it would be beneficial.  Try to keep your legs elevated when resting.  You can also wrap them from the bottom up with Ace bandage, but this will only be temporary fixes.  Please follow-up with your PCP.  Have a great day,  Clemetine Marker, MD

## 2020-07-24 NOTE — Telephone Encounter (Signed)
Left message to call back    Labs are stable. BNP shows that her fluid levels are good. Go back to lasix 40mg  daily with an extra dose as needed for shortness of breath, LE edema or weight gain.    TCR

## 2020-07-25 DIAGNOSIS — M170 Bilateral primary osteoarthritis of knee: Secondary | ICD-10-CM | POA: Diagnosis not present

## 2020-07-25 LAB — BASIC METABOLIC PANEL
BUN/Creatinine Ratio: 10 — ABNORMAL LOW (ref 12–28)
BUN: 12 mg/dL (ref 8–27)
CO2: 23 mmol/L (ref 20–29)
Calcium: 8.9 mg/dL (ref 8.7–10.3)
Chloride: 99 mmol/L (ref 96–106)
Creatinine, Ser: 1.18 mg/dL — ABNORMAL HIGH (ref 0.57–1.00)
Glucose: 123 mg/dL — ABNORMAL HIGH (ref 65–99)
Potassium: 3.5 mmol/L (ref 3.5–5.2)
Sodium: 139 mmol/L (ref 134–144)
eGFR: 50 mL/min/{1.73_m2} — ABNORMAL LOW (ref >59)

## 2020-07-25 LAB — BRAIN NATRIURETIC PEPTIDE

## 2020-07-26 NOTE — Assessment & Plan Note (Signed)
Not due to CHF.  Recent BNP was ~20.  Likely 2/2 chronic venous insufficiency with possibly her amlodipine contributing given the recent increase in dose.  Advised her to contact her cardiologist regarding changing the amlodipine dose if necessary.  Recommended compression stockings if she can somehow get someone to help her put them on.  Adjusting lasix unlikely to help given that she responds to the 40mg  and did not notice an improvement when increasing to 80mg .

## 2020-07-27 DIAGNOSIS — M25511 Pain in right shoulder: Secondary | ICD-10-CM | POA: Diagnosis not present

## 2020-07-27 DIAGNOSIS — M542 Cervicalgia: Secondary | ICD-10-CM | POA: Diagnosis not present

## 2020-07-30 DIAGNOSIS — M542 Cervicalgia: Secondary | ICD-10-CM | POA: Diagnosis not present

## 2020-07-31 ENCOUNTER — Other Ambulatory Visit: Payer: Self-pay | Admitting: Student in an Organized Health Care Education/Training Program

## 2020-07-31 DIAGNOSIS — S42201D Unspecified fracture of upper end of right humerus, subsequent encounter for fracture with routine healing: Secondary | ICD-10-CM | POA: Diagnosis not present

## 2020-08-07 ENCOUNTER — Ambulatory Visit: Payer: Medicare Other | Admitting: Student

## 2020-08-08 ENCOUNTER — Telehealth: Payer: Self-pay | Admitting: Family Medicine

## 2020-08-08 MED ORDER — GABAPENTIN 300 MG PO CAPS: 600.0000 mg | ORAL_CAPSULE | Freq: Two times a day (BID) | ORAL | 2 refills | Status: DC

## 2020-08-08 NOTE — Addendum Note (Signed)
Addended by: Brandon Melnick on: 08/08/2020 05:19 PM   Modules accepted: Orders

## 2020-08-08 NOTE — Telephone Encounter (Signed)
Pt is requesting a refill for gabapentin (NEURONTIN) 600 MG capsule.  Pharmacy: [facility] [phone]

## 2020-08-09 ENCOUNTER — Encounter: Payer: Medicare Other | Attending: Physical Medicine & Rehabilitation | Admitting: Physical Medicine & Rehabilitation

## 2020-08-09 ENCOUNTER — Encounter: Payer: Self-pay | Admitting: Physical Medicine & Rehabilitation

## 2020-08-09 ENCOUNTER — Other Ambulatory Visit: Payer: Self-pay

## 2020-08-09 VITALS — BP 147/87 | HR 99 | Temp 98.7°F | Ht 67.0 in | Wt 186.0 lb

## 2020-08-09 DIAGNOSIS — I2510 Atherosclerotic heart disease of native coronary artery without angina pectoris: Secondary | ICD-10-CM

## 2020-08-09 DIAGNOSIS — I2584 Coronary atherosclerosis due to calcified coronary lesion: Secondary | ICD-10-CM

## 2020-08-09 DIAGNOSIS — M259 Joint disorder, unspecified: Secondary | ICD-10-CM

## 2020-08-09 NOTE — Progress Notes (Signed)
Subjective:    Patient ID: [name], female    DOB: 03/29/1950, 70 y.o.   MRN: [id number]  HPI  70 year old female with prior history of intercostal neuralgia who suffered a fall and had pelvic fracture approximately 18 months ago.  She went through inpatient rehab.  She had severe pain but was eventually weaned down to tramadol in terms of her pain reliever.  She still takes gabapentin for her chronic intercostal neuralgia.  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  In addition patient was having problems with vertebral artery compression in the neck as well as some evidence of cervical myelopathy and underwent C3-C4 ACDF per Dr. Rolena Infante on 05/24/2020.  She had a fracture of the right shoulder that was sustained the day prior to surgery.  She is still recovering from that and sees Dr. Victorino December for this.  She is undergoing physical therapy.  She did not require operative treatment. Right sided low back pain has increased over the last 4 to 6 weeks.  Pain Inventory Average Pain 8 Pain Right Now 8 My pain is intermittent, sharp, burning, stabbing and aching  In the last 24 hours, has pain interfered with the following? General activity 8 Relation with others 8 Enjoyment of life 8 What TIME of day is your pain at its worst? daytime, evening and night Sleep (in general) Fair  Pain is worse with: walking, bending, standing and some activites Pain improves with: rest, heat/ice and medication Relief from Meds: 5  Family History  Problem Relation Age of Onset  . Cancer Mother 56       breast  . Alzheimer's disease Mother   . Prostate cancer Father   . Cancer Brother   . Hyperlipidemia Brother   . Lung cancer Father   . Dementia Maternal Grandmother   . Cancer Maternal Grandfather   . Cancer Brother   . Hyperlipidemia Brother   . Dementia Brother        frontotemporal dementia   Social History   Socioeconomic History  .  Marital status: Married    Spouse name: [name]  . Number of children: 1  . Years of education: 12+  . Highest education level: High school graduate  Occupational History  . Occupation: ACTIVITY DIRECTOR    Employer: [employer]  . Occupation: CNA  Tobacco Use  . Smoking status: Former Smoker    Packs/day: 0.30    Types: Cigarettes    Quit date: 11/01/2011    Years since quitting: 8.7  . Smokeless tobacco: Never Used  Vaping Use  . Vaping Use: Never used  Substance and Sexual Activity  . Alcohol use: No    Alcohol/week: 0.0 standard drinks  . Drug use: No  . Sexual activity: Yes    Partners: Male    Birth control/protection: Surgical    Comment: 1 sexual partner in last 12 months: married  Other Topics Concern  . Not on file  Social History Narrative   ** Merged History Encounter **       Patient is married ([name]). Patient has one child and grandchild. Her sons name is Shanon Brow. Patient sees them often. 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 Patient spends 6-10 hours with her 6 days a week.     Patient has a college education. Patient is very active in her church.  Hobbies- outside crafts and church  choir.   Social Determinants of Health   Financial Resource Strain: Not on file  Food Insecurity: Not on file  Transportation Needs: Not on file  Physical Activity: Not on file  Stress: Not on file  Social Connections: Not on file   Past Surgical History:  Procedure Laterality Date  . ABDOMINAL HYSTERECTOMY    . ANTERIOR CERVICAL DECOMP/DISCECTOMY FUSION N/A 05/24/2020   Procedure: ANTERIOR CERVICAL DECOMPRESSION/DISCECTOMY FUSION 1 LEVEL C3-4;  Surgeon: Melina Schools, MD;  Location: [place];  Service: Orthopedics;  Laterality: N/A;  3 hrs  . BILIARY STENT PLACEMENT N/A 12/12/2015   Procedure: BILIARY STENT PLACEMENT;  Surgeon: Doran Stabler, MD;  Location: [facility];  Service: Endoscopy;  Laterality: N/A;  .  BLADDER REPAIR    . CARPAL TUNNEL RELEASE    . CERVICAL FUSION    . CERVICAL SPINE SURGERY    . CHOLECYSTECTOMY N/A 12/06/2015   Procedure: LAPAROSCOPIC CHOLECYSTECTOMY WITH  INTRAOPERATIVE CHOLANGIOGRAM;  Surgeon: Stark Klein, MD;  Location: [facility];  Service: General;  Laterality: N/A;  . ELBOW SURGERY    . ERCP N/A 12/12/2015   Procedure: ENDOSCOPIC RETROGRADE CHOLANGIOPANCREATOGRAPHY (ERCP);  Surgeon: Doran Stabler, MD;  Location[facility] ENDOSCOPY;  Service: Endoscopy;  Laterality: N/A;  . ESOPHAGOGASTRODUODENOSCOPY N/A 01/28/2016   Procedure: ESOPHAGOGASTRODUODENOSCOPY (EGD) Biliary STENT removal;  Surgeon: Doran Stabler, MD;  Location: [facility];  Service: Gastroenterology;  Laterality: N/A;  . IR GENERIC HISTORICAL  12/17/2015   IR US GUIDE BX ASP/DRAIN 12/17/2015 [facility]  . IR GENERIC HISTORICAL  12/17/2015   IR GUIDED DRAIN W CATHETER PLACEMENT 12/17/2015 [facility]  . IR GENERIC HISTORICAL  12/17/2015   IR SINUS/FIST TUBE CHK-NON GI 12/17/2015 [facility]  . KNEE SURGERY    . LUNG SURGERY    . TEE WITHOUT CARDIOVERSION N/A 12/19/2015   Procedure: TRANSESOPHAGEAL ECHOCARDIOGRAM (TEE);  Surgeon: Josue Hector, MD;  Location: [facility] ENDOSCOPY;  Service: Cardiovascular;  Laterality: N/A;   Past Surgical History:  Procedure Laterality Date  . ABDOMINAL HYSTERECTOMY    . ANTERIOR CERVICAL DECOMP/DISCECTOMY FUSION N/A 05/24/2020   Procedure: ANTERIOR CERVICAL DECOMPRESSION/DISCECTOMY FUSION 1 LEVEL C3-4;  Surgeon: Melina Schools, MD;  Location: [place];  Service: Orthopedics;  Laterality: N/A;  3 hrs  . BILIARY STENT PLACEMENT N/A 12/12/2015   Procedure: BILIARY STENT PLACEMENT;  Surgeon: Doran Stabler, MD;  Location: [facility];  Service: Endoscopy;  Laterality: N/A;  . BLADDER REPAIR    . CARPAL TUNNEL RELEASE    . CERVICAL FUSION    . CERVICAL SPINE SURGERY    . CHOLECYSTECTOMY N/A 12/06/2015   Procedure: LAPAROSCOPIC CHOLECYSTECTOMY WITH  INTRAOPERATIVE CHOLANGIOGRAM;   Surgeon: Stark Klein, MD;  Location: [facility];  Service: General;  Laterality: N/A;  . ELBOW SURGERY    . ERCP N/A 12/12/2015   Procedure: ENDOSCOPIC RETROGRADE CHOLANGIOPANCREATOGRAPHY (ERCP);  Surgeon: Doran Stabler, MD;  Location: [facility] ENDOSCOPY;  Service: Endoscopy;  Laterality: N/A;  . ESOPHAGOGASTRODUODENOSCOPY N/A 01/28/2016   Procedure: ESOPHAGOGASTRODUODENOSCOPY (EGD) Biliary STENT removal;  Surgeon: Doran Stabler, MD;  Location: [facility];  Service: Gastroenterology;  Laterality: N/A;  . IR GENERIC HISTORICAL  12/17/2015   IR US GUIDE BX ASP/DRAIN 12/17/2015 [facility]  . IR GENERIC HISTORICAL  12/17/2015   IR GUIDED DRAIN W CATHETER PLACEMENT 12/17/2015 [facility]  . IR GENERIC HISTORICAL  12/17/2015   IR SINUS/FIST TUBE CHK-NON GI 12/17/2015 [facility]  . KNEE SURGERY    .  LUNG SURGERY    . TEE WITHOUT CARDIOVERSION N/A 12/19/2015   Procedure: TRANSESOPHAGEAL ECHOCARDIOGRAM (TEE);  Surgeon: Josue Hector, MD;  Location: [facility] ENDOSCOPY;  Service: Cardiovascular;  Laterality: N/A;   Past Medical History:  Diagnosis Date  . Anemia   . Cervical neuritis   . Chronic kidney disease    stage 3  . Dyslipidemia   . GERD (gastroesophageal reflux disease)   . History of transient ischemic attack (TIA)   . HTN (hypertension)   . Hyperlipidemia   . Migraine   . Migraine headache   . Occipital neuralgia    Left  . Pulmonary embolism ([place]) 2017  . Renal insufficiency   . Stroke ([place]) 10/2013   BP (!) 147/87   Pulse 99   Temp 98.7 F (37.1 C)   Ht 5\' 7"  (1.702 m)   Wt 186 lb (84.4 kg)   SpO2 96%   BMI 29.13 kg/m   Opioid Risk Score:   Fall Risk Score:  `1  Depression screen PHQ 2/9  Depression screen [facility] 2/9 07/24/2020 07/24/2020 05/22/2020 05/08/2020 01/24/2020 01/03/2020 12/23/2019  Decreased Interest 1 1 1 1 1 1 1   Down, Depressed, Hopeless 0 0 1 1 1 1 1   PHQ - 2 Score 1 1 2 2 2 2 2   Altered sleeping 0 0 1 - - 1 -  Tired, decreased energy 1 1 1  - - 1 -   Change in appetite 0 0 1 - - 1 -  Feeling bad or failure about yourself  0 0 2 - - 1 -  Trouble concentrating 0 0 0 - - 1 -  Moving slowly or fidgety/restless 0 0 2 - - 1 -  Suicidal thoughts 0 0 0 - - 0 -  PHQ-9 Score 2 2 9  - - 8 -  Difficult doing work/chores Not difficult at all - - - - Not difficult at all -  Some recent data might be hidden   Review of Systems  Musculoskeletal: Positive for back pain.  All other systems reviewed and are negative.      Objective:   Physical Exam Vitals and nursing note reviewed.  Constitutional:      Appearance: Normal appearance.  HENT:     Head: Normocephalic and atraumatic.  Eyes:     Extraocular Movements: Extraocular movements intact.     Conjunctiva/sclera: Conjunctivae normal.     Pupils: Pupils are equal, round, and reactive to light.  Musculoskeletal:     Comments: Tenderness palpation right PSIS.  Positive sacral compression test negative lateral compression test Patient has good lumbar flexion lumbar extension is limited but does not exacerbate pain no pain along the lumbar paraspinal area  Skin:    General: Skin is warm and dry.  Neurological:     Mental Status: She is alert and oriented to person, place, and time.     Comments: Motor strength is 5/5 bilateral hip flexor knee extensor ankle dorsiflexor Negative straight leg raising Lower extremity sensation is normal  Psychiatric:        Mood and Affect: Mood normal.        Behavior: Behavior normal.           Assessment & Plan:  1.  Right sacroiliac pain Repeat RIght L5 Dorsal ramus Right S1-2-3 lateral branch blocks  These were helpful for around 5 months which is beyond the usual duration however given the long duration of response would not move onto radiofrequency neurotomy. Continue tramadol twice  daily or 3 times daily as needed she does not require prescription at this time.

## 2020-08-09 NOTE — Patient Instructions (Signed)
Will repeat the same injection we did in December to help with Right side low back pain

## 2020-08-13 DIAGNOSIS — M542 Cervicalgia: Secondary | ICD-10-CM | POA: Diagnosis not present

## 2020-08-13 DIAGNOSIS — M25511 Pain in right shoulder: Secondary | ICD-10-CM | POA: Diagnosis not present

## 2020-08-13 NOTE — Telephone Encounter (Signed)
Advised patient, verbalized understanding  

## 2020-08-14 ENCOUNTER — Other Ambulatory Visit: Payer: Self-pay | Admitting: Student in an Organized Health Care Education/Training Program

## 2020-08-14 DIAGNOSIS — Z4889 Encounter for other specified surgical aftercare: Secondary | ICD-10-CM | POA: Diagnosis not present

## 2020-08-28 DIAGNOSIS — M25511 Pain in right shoulder: Secondary | ICD-10-CM | POA: Diagnosis not present

## 2020-08-29 DIAGNOSIS — S42201A Unspecified fracture of upper end of right humerus, initial encounter for closed fracture: Secondary | ICD-10-CM | POA: Diagnosis not present

## 2020-08-30 DIAGNOSIS — M25511 Pain in right shoulder: Secondary | ICD-10-CM | POA: Diagnosis not present

## 2020-09-03 DIAGNOSIS — M25511 Pain in right shoulder: Secondary | ICD-10-CM | POA: Diagnosis not present

## 2020-09-03 DIAGNOSIS — M542 Cervicalgia: Secondary | ICD-10-CM | POA: Diagnosis not present

## 2020-09-13 DIAGNOSIS — M170 Bilateral primary osteoarthritis of knee: Secondary | ICD-10-CM | POA: Diagnosis not present

## 2020-09-18 ENCOUNTER — Encounter: Payer: Self-pay | Admitting: Physical Medicine & Rehabilitation

## 2020-09-18 ENCOUNTER — Encounter: Payer: Medicare Other | Attending: Physical Medicine & Rehabilitation | Admitting: Physical Medicine & Rehabilitation

## 2020-09-18 ENCOUNTER — Other Ambulatory Visit: Payer: Self-pay

## 2020-09-18 VITALS — Temp 98.2°F | Ht 67.0 in | Wt 182.6 lb

## 2020-09-18 DIAGNOSIS — M259 Joint disorder, unspecified: Secondary | ICD-10-CM

## 2020-09-18 NOTE — Progress Notes (Signed)
  PROCEDURE RECORD [facility]   Name: [name] DOB:[birth year] MRN: [id number]  Date:09/18/2020  Physician: Alysia Penna, MD    Nurse/CMA: Truman Hayward, CMA  Allergies:  Allergies  Allergen Reactions   Shellfish Allergy Anaphylaxis    All seafood   Iodine    Statins Other (See Comments)    Arthralgias (severe) with atorvastatin, mild arthralgias with rosuvastatin but willing to continue taking it    Consent Signed: Yes.    Is patient diabetic? No.  CBG today?   Pregnant: No. LMP: No LMP recorded. Patient has had a hysterectomy. (age [age])  Anticoagulants: yes (Plavix) Anti-inflammatory: no Antibiotics: no  Procedure: Right S1-2-3 Lateral Branch Block  Position: Prone Start Time: 3:30 pm  End Time: 3:41pm  Fluoro Time: 25  RN/CMA Truman Hayward, CMA [name], CMA    Time 2:50 pm 3:48pm    BP 152/85 194/97    Pulse 87 93    Respirations 16 16    O2 Sat 95 93    S/S 6 6    Pain Level 8/10 2/10     D/C home with no one, [name] & O X 3, D/C instructions reviewed, and sits independently.       :

## 2020-09-18 NOTE — Patient Instructions (Signed)
Sacroiliac nerve block injections performed today. ? ?The injection was done under x-ray guidance with contrast enhancement. ?This procedure has been performed to help reduce low back and buttocks pain as well as potentially hip pain. ?The duration of this injection is variable lasting from hours to  weeks ?If this injection produces a >50%  short term relief of pain, then a radiofrequency ablation of these same nerves may result in a 6mo + pain relief  ?

## 2020-09-18 NOTE — Progress Notes (Signed)
L5 dorsal ramus S1-S2-S3 lateral branch blocks under fluoroscopic guidance Right side  Informed consent was obtained after describing risks and benefits of the procedure with patient these include bleeding bruising and infection.  He elects to proceed and has given written consent.  Patient placed prone on fluoroscopy table Betadine prep sterile drape a 25-gauge 1.5 inch needle was used to anesthetize skin and subcu tissue with 1% lidocaine 1 cc into each of 4 sites.  Then a 22-gauge 3.5" needle was inserted under fluoroscopic guidance for starting the S1 SAP sacral ala junction.  Bone contact made.  Isovue 200 x 0.5 mL demonstrated no intravascular uptake then 0.5 mL of 2% lidocaine was injected.  Then the lateral aspect of the S1, S2, S3 foramen was targeted.  Bone contact made out, Isovue-200 times 0.5 mL demonstrated no nerve root or intravascular uptake within 0.5 mL of 2% lidocaine solution was injected after negative drawback for blood.  Patient tolerated procedure well.  Postinjection instructions given.   Preinjection pain 8/10 Postinjection pain 2/10

## 2020-09-20 DIAGNOSIS — M1711 Unilateral primary osteoarthritis, right knee: Secondary | ICD-10-CM | POA: Diagnosis not present

## 2020-09-20 DIAGNOSIS — M1712 Unilateral primary osteoarthritis, left knee: Secondary | ICD-10-CM | POA: Diagnosis not present

## 2020-09-20 DIAGNOSIS — M170 Bilateral primary osteoarthritis of knee: Secondary | ICD-10-CM | POA: Diagnosis not present

## 2020-09-27 DIAGNOSIS — M170 Bilateral primary osteoarthritis of knee: Secondary | ICD-10-CM | POA: Diagnosis not present

## 2020-10-09 NOTE — Patient Instructions (Addendum)
Below is our plan:  We will continue zonisamide '100mg'$  twice daily every day. I would advise you take the extra '25mg'$  dose twice daily for 4-6 weeks to see if this helps with decreasing frequency of headaches. Continue doxepin '200mg'$  at bedtime. Call Dr Letta Pate for gabapentin '600mg'$  refill.   Please make sure you are staying well hydrated. I recommend 50-60 ounces daily. Well balanced diet and regular exercise encouraged. Consistent sleep schedule with 6-8 hours recommended.   Please continue follow up with care team as directed.   Follow up with me in 1 year   You may receive a survey regarding today's visit. I encourage you to leave honest feed back as I do use this information to improve patient care. Thank you for seeing me today!

## 2020-10-09 NOTE — Progress Notes (Signed)
PATIENT: [name] DOB: September 08, 1950  REASON FOR VISIT: follow up HISTORY FROM: patient  Chief Complaint  Patient presents with   Follow-up    Rm 1, alone. Here for migraine f/u, pt states migraines have gotten worse, but has been able to control them. Pt states everyday she feels pn start in the back of the neck. Pt would like a new Rx for gabapentin '600mg'$ .       HISTORY OF PRESENT ILLNESS: 10/10/2020 ALL: [name] returns for follow up for migraines and insomnia. She is doing well. She continues zonisamide '100mg'$  BID and doxepin '200mg'$  at bedtime. She has had several stressors since we last saw her. Her brother had to have emergency surgery so she has had to help care for him and his hair salon.  She is seeing Dr Rolena Infante, [facility], and Dr Letta Pate, sports med, for chronic neck and back pain. Dr Letta Pate has her on gabapentin '600mg'$  twice daily that helps some. Dr Stann Mainland for broken shoulder and Dr Margit Banda for chronic knee pain.   Headaches are occurring most days. She feels that she has pain starting in her neck that radiates to the top of her head everyday around 3pm. She has not tried taking extra '25mg'$  dose of imipramine. She is sleeping well. She is tolerating doxepin.   10/10/2019 ALL:  [name] is a 70 y.o. female here today for follow up for headaches. She continues zonisamide '100mg'$  daily. She has a '25mg'$  dose that she rarely takes. She has daily headaches, mostly in the mornings, that are usually easily treatable. She does note that weather changes are a trigger for her. She is able to abort headaches with rest. She may have 1-2 migraines per month.   She continues doxepin '200mg'$  at night for insomnia. She has been on this medication for years. She reports that she has a difficult time sleeping. She struggles to go to sleep at night and to stay asleep. She does wake frequently at night to use the restroom. She wakes with a dry mouth. She is not sure if she snores. She has a  brother who uses CPAP therapy.   HISTORY: (copied from my note on 12/09/2018)  [name] is a 70 y.o. female here today for follow up for migraines. She started Ajovy about a year ago. She felt that it helped initially but after a few months it did not seem to help. She has daily dull headaches, usually behind the right eye. About 1-2 times a month she has more severe headache with light, sound sensitivity and nausea. She continues zonisamide '100mg'$  and tizanidine '2mg'$  daily. She takes gabapentin '600mg'$  1-2 times daily for headaches and dysesthesias in her left side. Doxepin helps with sleep. She is followed closely by PCP for BP and cholesterol management. She is intolerant to statins, now on Repatha. On Plavix.    HISTORY: (copied from [place] note on 03/06/2018)   [name] is a 70 year old female with a history of intractable migraine headaches.  She returns today for follow-up.  Overall she feels that she has done relatively well.  [name].  However she states that she has not had a severe headache in the last 6 months.  She reports she does have photophobia with her headaches.  She states that if she misses her medication then her headache will worsen.  She reports that she was unable to  obtain Aimovig.  She continues on Zonegran and tizanidine.   HISTORY 07/01/17 [name] is a 70 year old female with a history of intractable migraine headaches.  She returns today for follow-up.  She reports that she continues to have a daily dull headache.  She remains on Zonegran and tizanidine.  She states that she did receive a letter about Aimovig but was unable to follow-up on this due to her brother being in the hospital.  She states that she only has approximately one severe migraine a month.  Her headaches seem to occur on the right side starting at the neck radiating to the eyes.  She does have photophobia, phonophobia  and nausea.  She continues to take doxepin at night.  She returns today for an evaluation.    REVIEW OF SYSTEMS: Out of a complete 14 system review of symptoms, the patient complains only of the following symptoms, morning headaches, dry mouth, fatigue, chronic pain, insomnia and all other reviewed systems are negative.  ALLERGIES: Allergies  Allergen Reactions   Shellfish Allergy Anaphylaxis    All seafood   Iodine    Statins Other (See Comments)    Arthralgias (severe) with atorvastatin, mild arthralgias with rosuvastatin but willing to continue taking it    HOME MEDICATIONS: Outpatient Medications Prior to Visit  Medication Sig Dispense Refill   albuterol (VENTOLIN HFA) 108 (90 Base) MCG/ACT inhaler Inhale 2 puffs into the lungs every 6 (six) hours as needed for wheezing or shortness of breath. 8 g 6   cetirizine (ZYRTEC) 10 MG tablet Take 10 mg by mouth daily with breakfast.     ezetimibe (ZETIA) 10 MG tablet Take 10 mg by mouth daily.     fluticasone (FLONASE) 50 MCG/ACT nasal spray Place 1 spray into both nostrils daily. (Patient taking differently: Place 1 spray into both nostrils daily as needed for allergies.)  2   furosemide (LASIX) 40 MG tablet Take 40 mg by mouth daily.     gabapentin (NEURONTIN) 300 MG capsule Take 2 capsules (600 mg total) by mouth 2 (two) times daily. 120 capsule 2   ondansetron (ZOFRAN) 4 MG tablet Take 1 tablet (4 mg total) by mouth every 8 (eight) hours as needed for nausea or vomiting. 20 tablet 0   pantoprazole (PROTONIX) 40 MG tablet TAKE 1 TABLET(40 MG) BY MOUTH DAILY WITH BREAKFAST (Patient taking differently: Take 40 mg by mouth daily.) 30 tablet 10   sertraline (ZOLOFT) 50 MG tablet TAKE 1 TABLET(50 MG) BY MOUTH DAILY 60 tablet 1   traMADol (ULTRAM) 50 MG tablet Take 50 mg by mouth 3 (three) times daily as needed.     traZODone (DESYREL) 50 MG tablet TAKE 1/2 TO 1 TABLET(25 TO 50 MG) BY MOUTH AT BEDTIME AS NEEDED FOR SLEEP 90 tablet 0   doxepin  (SINEQUAN) 100 MG capsule Take 2 capsules (200 mg total) by mouth at bedtime. 180 capsule 1   zonisamide (ZONEGRAN) 100 MG capsule TAKE 1 CAPSULE(100 MG) BY MOUTH TWICE DAILY (Patient taking differently: Take 100 mg by mouth 2 (two) times daily. TAKE 1 CAPSULE(100 MG) BY MOUTH TWICE DAILY) 60 capsule 5   zonisamide (ZONEGRAN) 25 MG capsule take 25 mg twice daily prn in addition to 100 mg twice daily. (Patient taking differently: Take 25 mg by mouth See admin instructions. take 25 mg twice daily prn in addition to 100 mg twice daily.) 180 capsule 1   amLODipine (NORVASC) 10 MG tablet Take 1 tablet (10 mg total) by mouth daily.  180 tablet 3   No facility-administered medications prior to visit.    PAST MEDICAL HISTORY: Past Medical History:  Diagnosis Date   Anemia    Cervical neuritis    Chronic kidney disease    stage 3   Dyslipidemia    GERD (gastroesophageal reflux disease)    History of transient ischemic attack (TIA)    HTN (hypertension)    Hyperlipidemia    Migraine    Migraine headache    Occipital neuralgia    Left   Pulmonary embolism ([place]) 2017   Renal insufficiency    Stroke ([place]) 10/2013    PAST SURGICAL HISTORY: Past Surgical History:  Procedure Laterality Date   ABDOMINAL HYSTERECTOMY     ANTERIOR CERVICAL DECOMP/DISCECTOMY FUSION N/A 05/24/2020   Procedure: ANTERIOR CERVICAL DECOMPRESSION/DISCECTOMY FUSION 1 LEVEL C3-4;  Surgeon: Melina Schools, MD;  Location: [facility];  Service: Orthopedics;  Laterality: N/A;  3 hrs   BILIARY STENT PLACEMENT N/A 12/12/2015   Procedure: BILIARY STENT PLACEMENT;  Surgeon: Doran Stabler, MD;  Location: [facility];  Service: Endoscopy;  Laterality: N/A;   BLADDER REPAIR     CARPAL TUNNEL RELEASE     CERVICAL FUSION     CERVICAL SPINE SURGERY     CHOLECYSTECTOMY N/A 12/06/2015   Procedure: LAPAROSCOPIC CHOLECYSTECTOMY WITH  INTRAOPERATIVE CHOLANGIOGRAM;  Surgeon: Stark Klein, MD;  Location: [facility];  Service: General;  Laterality:  N/A;   ELBOW SURGERY     ERCP N/A 12/12/2015   Procedure: ENDOSCOPIC RETROGRADE CHOLANGIOPANCREATOGRAPHY (ERCP);  Surgeon: Doran Stabler, MD;  [facility];  Service: Endoscopy;  Laterality: N/A;   ESOPHAGOGASTRODUODENOSCOPY N/A 01/28/2016   Procedure: ESOPHAGOGASTRODUODENOSCOPY (EGD) Biliary STENT removal;  Surgeon: Doran Stabler, MD;  Location: [facility];  Service: Gastroenterology;  Laterality: N/A;   IR GENERIC HISTORICAL  12/17/2015   IR US GUIDE BX ASP/DRAIN 12/17/2015 [facility]   IR GENERIC HISTORICAL  12/17/2015   IR GUIDED DRAIN W CATHETER PLACEMENT 12/17/2015 [facility]   IR GENERIC HISTORICAL  12/17/2015   IR SINUS/FIST TUBE CHK-NON GI 12/17/2015 [facility]   KNEE SURGERY     LUNG SURGERY     TEE WITHOUT CARDIOVERSION N/A 12/19/2015   Procedure: TRANSESOPHAGEAL ECHOCARDIOGRAM (TEE);  Surgeon: Josue Hector, MD;  Location[facility] END[facility]COPY;  Service: Cardiovascular;  Laterality: N/A;    FAMILY HISTORY: Family History  Problem Relation Age of Onset   Cancer Mother 40       breast   Alzheimer's disease Mother    Prostate cancer Father    Cancer Brother    Hyperlipidemia Brother    Lung cancer Father    Dementia Maternal Grandmother    Cancer Maternal Grandfather    Cancer Brother    Hyperlipidemia Brother    Dementia Brother        frontotemporal dementia    SOCIAL HISTORY: Social History   Socioeconomic History   Marital status: Married    Spouse name: [name]   Number of children: 1   Years of education: 12+   Highest education level: High school graduate  Occupational History   Occupation: ACTIVITY DIRECTOR    Employer: [employer]   Occupation: CNA  Tobacco Use   Smoking status: Former    Packs/day: 0.30    Types: Cigarettes    Quit date: 11/01/2011    Years since quitting: 8.9   Smokeless tobacco: Never  Vaping Use   Vaping Use: Never used  Substance and  Sexual Activity   Alcohol use: No    Alcohol/week: 0.0 standard  drinks   Drug use: No   Sexual activity: Yes    Partners: Male    Birth control/protection: Surgical    Comment: 1 sexual partner in last 12 months: married  Other Topics Concern   Not on file  Social History Narrative   ** Merged History Encounter **       Patient is married ([name]). Patient has one child and grandchild. Her sons name is [name]. Patient sees them often. 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 Patient spends 6-10 hours with her 6 days a week.     Patient has a college education. Patient is very active in her church.  Hobbies- outside crafts and church choir.   Social Determinants of Health   Financial Resource Strain: Not on file  Food Insecurity: Not on file  Transportation Needs: Not on file  Physical Activity: Not on file  Stress: Not on file  Social Connections: Not on file  Intimate Partner Violence: Not on file      PHYSICAL EXAM  Vitals:   10/10/20 0723  BP: (!) 142/92  Pulse: (!) 103  Weight: 183 lb (83 kg)  Height: '5\' 7"'$  (1.702 m)    Body mass index is 28.66 kg/m.  Generalized: Well developed, in no acute distress  Cardiology: normal rate and rhythm, no murmur noted Respiratory: clear to auscultation bilaterally  Neurological examination  Mentation: Alert oriented to time, place, history taking. Follows all commands speech and language fluent Cranial nerve II-XII: Pupils were equal round reactive to light. Extraocular movements were full, visual field were full on confrontational test. Facial sensation and strength were normal. Uvula tongue midline. Head turning and shoulder shrug  were normal and symmetric. Motor: The motor testing reveals 5 over 5 strength of all 4 extremities. Good symmetric motor tone is noted throughout.  Sensory: Sensory testing is intact to soft touch on all 4 extremities. No evidence of extinction is noted.  Coordination: Cerebellar testing reveals good  finger-nose-finger and heel-to-shin bilaterally.  Gait and station: Gait is stable with cane    DIAGNOSTIC DATA (LABS, IMAGING, TESTING) - I reviewed patient records, labs, notes, testing and imaging myself where available.  No flowsheet data found.   Lab Results  Component Value Date   WBC 11.5 (H) 05/22/2020   HGB 13.6 05/22/2020   HCT 42.0 05/22/2020   MCV 84 05/22/2020   PLT 322 05/22/2020      Component Value Date/Time   NA 139 07/20/2020 1108   K 3.5 07/20/2020 1108   CL 99 07/20/2020 1108   CO2 23 07/20/2020 1108   GLUCOSE 123 (H) 07/20/2020 1108   GLUCOSE 135 (H) 08/29/2019 1030   BUN 12 07/20/2020 1108   CREATININE 1.18 (H) 07/20/2020 1108   CREATININE 1.10 (H) 02/25/2016 0907   CALCIUM 8.9 07/20/2020 1108   PROT 7.0 08/29/2019 1030   PROT 6.5 08/25/2019 1351   ALBUMIN 4.1 08/29/2019 1030   ALBUMIN 4.4 08/25/2019 1351   AST 24 08/29/2019 1030   ALT 18 08/29/2019 1030   ALKPHOS 159 (H) 08/29/2019 1030   BILITOT 0.8 08/29/2019 1030   BILITOT <0.2 08/25/2019 1351   GFRNONAA >60 08/29/2019 1030   GFRNONAA 53 (L) 02/25/2016 0907   GFRAA >60 08/29/2019 1030   GFRAA 61 02/25/2016 0907   Lab Results  Component Value Date   CHOL 210 (H) 06/13/2019  HDL 48 06/13/2019   LDLCALC 123 (H) 06/13/2019   LDLDIRECT 156 (H) 03/27/2017   TRIG 224 (H) 06/13/2019   CHOLHDL 4.4 06/13/2019   Lab Results  Component Value Date   HGBA1C 5.5 07/30/2017   Lab Results  Component Value Date   VITAMINB12 290 07/21/2017   Lab Results  Component Value Date   TSH 1.610 11/03/2018     ASSESSMENT AND PLAN 70 y.o. year old female  has a past medical history of Anemia, Cervical neuritis, Chronic kidney disease, Dyslipidemia, GERD (gastroesophageal reflux disease), History of transient ischemic attack (TIA), HTN (hypertension), Hyperlipidemia, Migraine, Migraine headache, Occipital neuralgia, Pulmonary embolism ([place]) (2017), Renal insufficiency, and Stroke ([place]) (10/2013). here  with     ICD-10-CM   1. Migraine without aura and without status migrainosus, not intractable  G43.009     2. Insomnia, unspecified type  G47.00        [name] reports that daily headaches continue, however, migraines seem well managed on zonisamide '100mg'$  twice daily. I will have her take extra dose of '25mg'$  for total of '125mg'$  twice daily to see if this helps. She can wean dose in future if headache improve. Insomnia is better on doxepin '200mg'$  at bedtime. She will let me know if headaches do not respond to current plan. Will consider sleep study in future, if needed. She will continue working on healthy lifestyle habits. We will follow up pending sleep evaluation.    No orders of the defined types were placed in this encounter.    Meds ordered this encounter  Medications   zonisamide (ZONEGRAN) 100 MG capsule    Sig: TAKE 1 CAPSULE(100 MG) BY MOUTH TWICE DAILY    Dispense:  180 capsule    Refill:  3    May file to be used when needed.    Order Specific Question:   Supervising Provider    Answer:   Melvenia Beam [id number]   zonisamide (ZONEGRAN) 25 MG capsule    Sig: take 25 mg twice daily prn in addition to 100 mg twice daily.    Dispense:  180 capsule    Refill:  3    Order Specific Question:   Supervising Provider    Answer:   Melvenia Beam [id number]   doxepin (SINEQUAN) 100 MG capsule    Sig: Take 2 capsules (200 mg total) by mouth at bedtime.    Dispense:  180 capsule    Refill:  3    Order Specific Question:   Supervising Provider    Answer:   Melvenia Beam [id number], FNP-C 10/10/2020, 8:45 AM [facility] [address], [place], [place] [address] [phone]

## 2020-10-10 ENCOUNTER — Other Ambulatory Visit: Payer: Self-pay

## 2020-10-10 ENCOUNTER — Ambulatory Visit (INDEPENDENT_AMBULATORY_CARE_PROVIDER_SITE_OTHER): Payer: Medicare Other | Admitting: Family Medicine

## 2020-10-10 ENCOUNTER — Encounter: Payer: Self-pay | Admitting: Family Medicine

## 2020-10-10 VITALS — BP 142/92 | HR 103 | Ht 67.0 in | Wt 183.0 lb

## 2020-10-10 DIAGNOSIS — G43009 Migraine without aura, not intractable, without status migrainosus: Secondary | ICD-10-CM | POA: Diagnosis not present

## 2020-10-10 DIAGNOSIS — G4700 Insomnia, unspecified: Secondary | ICD-10-CM | POA: Diagnosis not present

## 2020-10-10 MED ORDER — DOXEPIN HCL 100 MG PO CAPS: 200.0000 mg | ORAL_CAPSULE | Freq: Every day | ORAL | 3 refills | Status: DC

## 2020-10-10 MED ORDER — ZONISAMIDE 100 MG PO CAPS: ORAL_CAPSULE | ORAL | 3 refills | Status: DC

## 2020-10-10 MED ORDER — ZONISAMIDE 25 MG PO CAPS: ORAL_CAPSULE | ORAL | 3 refills | Status: DC

## 2020-10-10 NOTE — Progress Notes (Signed)
I have read the note, and I agree with the clinical assessment and plan.  Zyshonne Malecha K Angelik Walls   

## 2020-10-11 DIAGNOSIS — S42201S Unspecified fracture of upper end of right humerus, sequela: Secondary | ICD-10-CM | POA: Diagnosis not present

## 2020-10-12 ENCOUNTER — Other Ambulatory Visit: Payer: Self-pay | Admitting: Student in an Organized Health Care Education/Training Program

## 2020-10-16 ENCOUNTER — Other Ambulatory Visit: Payer: Self-pay | Admitting: Orthopedic Surgery

## 2020-10-16 DIAGNOSIS — S42201S Unspecified fracture of upper end of right humerus, sequela: Secondary | ICD-10-CM

## 2020-10-18 ENCOUNTER — Telehealth: Payer: Self-pay

## 2020-10-18 NOTE — Telephone Encounter (Signed)
Called to schedule pt AWV - not able to leave a message.

## 2020-11-01 ENCOUNTER — Other Ambulatory Visit: Payer: Self-pay

## 2020-11-01 ENCOUNTER — Ambulatory Visit
Admission: RE | Admit: 2020-11-01 | Discharge: 2020-11-01 | Disposition: A | Discharge status: Home / Self Care | Payer: Medicare Other | Source: Ambulatory Visit | Attending: Family Medicine | Admitting: Family Medicine

## 2020-11-01 DIAGNOSIS — Z87310 Personal history of (healed) osteoporosis fracture: Secondary | ICD-10-CM

## 2020-11-01 DIAGNOSIS — S72009S Fracture of unspecified part of neck of unspecified femur, sequela: Secondary | ICD-10-CM

## 2020-11-01 DIAGNOSIS — Z13820 Encounter for screening for osteoporosis: Secondary | ICD-10-CM

## 2020-11-01 DIAGNOSIS — Z780 Asymptomatic menopausal state: Secondary | ICD-10-CM | POA: Diagnosis not present

## 2020-11-01 DIAGNOSIS — M85852 Other specified disorders of bone density and structure, left thigh: Secondary | ICD-10-CM | POA: Diagnosis not present

## 2020-11-02 ENCOUNTER — Ambulatory Visit
Admission: RE | Admit: 2020-11-02 | Discharge: 2020-11-02 | Disposition: A | Discharge status: Home / Self Care | Payer: Medicare Other | Source: Ambulatory Visit | Attending: Orthopedic Surgery | Admitting: Orthopedic Surgery

## 2020-11-02 DIAGNOSIS — Z01818 Encounter for other preprocedural examination: Secondary | ICD-10-CM | POA: Diagnosis not present

## 2020-11-02 DIAGNOSIS — M25411 Effusion, right shoulder: Secondary | ICD-10-CM | POA: Diagnosis not present

## 2020-11-02 DIAGNOSIS — M19011 Primary osteoarthritis, right shoulder: Secondary | ICD-10-CM | POA: Diagnosis not present

## 2020-11-02 DIAGNOSIS — S42201S Unspecified fracture of upper end of right humerus, sequela: Secondary | ICD-10-CM

## 2020-11-02 DIAGNOSIS — Z981 Arthrodesis status: Secondary | ICD-10-CM | POA: Diagnosis not present

## 2020-11-05 ENCOUNTER — Telehealth: Payer: Self-pay | Admitting: Registered Nurse

## 2020-11-05 ENCOUNTER — Other Ambulatory Visit: Payer: Self-pay | Admitting: Physical Medicine & Rehabilitation

## 2020-11-05 ENCOUNTER — Other Ambulatory Visit: Payer: Self-pay | Admitting: Student in an Organized Health Care Education/Training Program

## 2020-11-05 MED ORDER — TRAMADOL HCL 50 MG PO TABS: 50.0000 mg | ORAL_TABLET | Freq: Three times a day (TID) | ORAL | 0 refills | Status: DC | PRN

## 2020-11-05 NOTE — Telephone Encounter (Signed)
Dr. Letta Pate is on vacation

## 2020-11-05 NOTE — Telephone Encounter (Signed)
Last Fill Date on Tramadol was in April. Dr Letta Pate, notes were reviewed. PMP was Reviewed. Placed a call to Ms. Kawa regarding her Tramadol, awaiting a return call.

## 2020-11-05 NOTE — Telephone Encounter (Signed)
[name] return the call, she reports she takes her Tramadol as needed. Her last refill was in April 2022. Tramadol e-scribed today, she verbalizes understanding.

## 2020-11-06 ENCOUNTER — Telehealth: Payer: Self-pay | Admitting: Student

## 2020-11-06 NOTE — Telephone Encounter (Signed)
-----   Message from Leeanne Rio, MD sent at 11/02/2020  5:44 PM EDT -----  ----- Message ----- From: Interface, Rad Results In Sent: 11/01/2020  12:24 PM EDT To: Richarda Osmond, MD

## 2020-11-06 NOTE — Telephone Encounter (Signed)
Attempted to call patient with recent DEXA scan results and recommendations for calcium and vitamin D supplementation. Left pt a message to call me back at the clinic. Will send pt a message through mychart.

## 2020-11-13 DIAGNOSIS — Z4889 Encounter for other specified surgical aftercare: Secondary | ICD-10-CM | POA: Diagnosis not present

## 2020-11-13 DIAGNOSIS — U071 COVID-19: Secondary | ICD-10-CM | POA: Diagnosis not present

## 2020-11-15 ENCOUNTER — Telehealth: Payer: Self-pay | Admitting: *Deleted

## 2020-11-15 NOTE — Telephone Encounter (Signed)
   [facility] Pre-operative Risk Assessment    Patient Name: [name]  DOB: [birth year] MRN: [id number]  HEARTCARE STAFF:  - IMPORTANT!!!!!! Under Visit Info/Reason for Call, type in Other and utilize the format Clearance MM/DD/YY or Clearance TBD. Do not use dashes or single digits. - Please review there is not already an duplicate clearance open for this procedure. - If request is for dental extraction, please clarify the # of teeth to be extracted. - If the patient is currently at the dentist's office, call Pre-Op Callback Staff (MA/nurse) to input urgent request.  - If the patient is not currently in the dentist office, please route to the Pre-Op pool.  Request for surgical clearance:  What type of surgery is being performed? Right Shoulder reverse Arthroplasty  When is this surgery scheduled? TBD  What type of clearance is required (medical clearance vs. Pharmacy clearance to hold med vs. Both)? Medical  Are there any medications that need to be held prior to surgery and how long? None  Practice name and name of physician performing surgery? [facility] Dr Victorino December  What is the office phone number? [phone] Attn: Marianna Fuss Maze   7.   What is the office fax number? [phone]  8.   Anesthesia type (None, local, MAC, general) ? Choice   Barbaraann Barthel 11/15/2020, 4:41 PM  _________________________________________________________________   (provider comments below)

## 2020-11-16 NOTE — Telephone Encounter (Signed)
Left a message for the patient to call back and speak to the on-call preop APP of the day.  I previously cleared the patient in March for a spinal surgery.  As long as she does not have any chest discomfort, she should be cleared for the shoulder surgery as well.

## 2020-11-20 ENCOUNTER — Encounter: Payer: Medicare Other | Attending: Physical Medicine & Rehabilitation | Admitting: Physical Medicine & Rehabilitation

## 2020-11-20 ENCOUNTER — Encounter: Payer: Self-pay | Admitting: Physical Medicine & Rehabilitation

## 2020-11-20 ENCOUNTER — Other Ambulatory Visit: Payer: Self-pay

## 2020-11-20 VITALS — BP 149/85 | HR 87 | Temp 98.0°F | Ht 67.0 in | Wt 172.8 lb

## 2020-11-20 DIAGNOSIS — I2584 Coronary atherosclerosis due to calcified coronary lesion: Secondary | ICD-10-CM | POA: Diagnosis not present

## 2020-11-20 DIAGNOSIS — G8929 Other chronic pain: Secondary | ICD-10-CM | POA: Diagnosis not present

## 2020-11-20 DIAGNOSIS — I2510 Atherosclerotic heart disease of native coronary artery without angina pectoris: Secondary | ICD-10-CM | POA: Diagnosis not present

## 2020-11-20 DIAGNOSIS — M533 Sacrococcygeal disorders, not elsewhere classified: Secondary | ICD-10-CM | POA: Insufficient documentation

## 2020-11-20 MED ORDER — GABAPENTIN 600 MG PO TABS: 600.0000 mg | ORAL_TABLET | Freq: Two times a day (BID) | ORAL | 11 refills | Status: DC

## 2020-11-20 NOTE — Progress Notes (Signed)
Subjective:    Patient ID: [name], female    DOB: [birth year], 70 y.o.   MRN: [id number]  HPI 70 year old female with history of pelvic fracture as well as lumbar spinal stenosis as well as cervical spinal stenosis who returns today with primary complaint of left greater than right low back and buttock pain.  She is no longer taking high-dose narcotic analgesics she still does take 50 mg tramadol 1 to 2 tablets/day Right sacroiliac nerve blocks performed 09/18/20 Pre injection 8/10, post injection 2/10 Lasted ~2wks  Left sacroiliac nerve blocks performed 04/10/20, Preinjection pain 10/10, post injection pain 2/10 , 100% pain relief lasting ~8mo Patient has occasional pain radiating down left lower extremity mainly above the knee but occasionally below the knee.  No numbness or tingling or progressive weakness in the lower extremities.  Normal new bowel or bladder dysfunction. Pain Inventory Average Pain 8 Pain Right Now 9 My pain is intermittent, sharp, burning, stabbing, and aching  In the last 24 hours, has pain interfered with the following? General activity 9 Relation with others 9 Enjoyment of life 9 What TIME of day is your pain at its worst? morning , daytime, and evening Sleep (in general) Poor  Pain is worse with: walking, bending, sitting, standing, and some activites Pain improves with: heat/ice, pacing activities, and medication Relief from Meds: 5  Family History  Problem Relation Age of Onset   Cancer Mother 755      breast   Alzheimer's disease Mother    Prostate cancer Father    Cancer Brother    Hyperlipidemia Brother    Lung cancer Father    Dementia Maternal Grandmother    Cancer Maternal Grandfather    Cancer Brother    Hyperlipidemia Brother    Dementia Brother        frontotemporal dementia   Social History   Socioeconomic History   Marital status: Married    Spouse name: [name]   Number of children: 1   Years of education: 12+   Highest  education level: High school graduate  Occupational History   Occupation: ACTIVITY DIRECTOR    Employer: [email or web address]   Occupation: CNA  Tobacco Use   Smoking status: Former    Packs/day: 0.30    Types: Cigarettes    Quit date: 11/01/2011    Years since quitting: 9.0   Smokeless tobacco: Never  Vaping Use   Vaping Use: Never used  Substance and Sexual Activity   Alcohol use: No    Alcohol/week: 0.0 standard drinks   Drug use: No   Sexual activity: Yes    Partners: Male    Birth control/protection: Surgical    Comment: 1 sexual partner in last 12 months: married  Other Topics Concern   Not on file  Social History Narrative   ** Merged History Encounter **       Patient is married ([name]). Patient has one child and grandchild. Her sons name is DShanon Brow Patient sees them often. 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 Patient spends 6-10 hours with her 6 days a week.     Patient has a college education. Patient is very active in her church.  Hobbies- outside crafts and church choir.   Social Determinants of Health   Financial Resource Strain: Not on file  Food Insecurity: Not on file  Transportation Needs: Not on file  Physical Activity: Not on file  Stress: Not on file  Social Connections: Not on file   Past Surgical History:  Procedure Laterality Date   ABDOMINAL HYSTERECTOMY     ANTERIOR CERVICAL DECOMP/DISCECTOMY FUSION N/A 05/24/2020   Procedure: ANTERIOR CERVICAL DECOMPRESSION/DISCECTOMY FUSION 1 LEVEL C3-4;  Surgeon: Melina Schools, MD;  Location: [place];  Service: Orthopedics;  Laterality: N/A;  3 hrs   BILIARY STENT PLACEMENT N/A 12/12/2015   Procedure: BILIARY STENT PLACEMENT;  Surgeon: Doran Stabler, MD;  Location: [place];  Service: Endoscopy;  Laterality: N/A;   BLADDER REPAIR     CARPAL TUNNEL RELEASE     CERVICAL FUSION     CERVICAL SPINE SURGERY     CHOLECYSTECTOMY N/A 12/06/2015   Procedure:  LAPAROSCOPIC CHOLECYSTECTOMY WITH  INTRAOPERATIVE CHOLANGIOGRAM;  Surgeon: Stark Klein, MD;  Location: [place];  Service: General;  Laterality: N/A;   ELBOW SURGERY     ERCP N/A 12/12/2015   Procedure: ENDOSCOPIC RETROGRADE CHOLANGIOPANCREATOGRAPHY (ERCP);  Surgeon: Doran Stabler, MD;  Location: [facility] ENDOSCOPY;  Service: Endoscopy;  Laterality: N/A;   ESOPHAGOGASTRODUODENOSCOPY N/A 01/28/2016   Procedure: ESOPHAGOGASTRODUODENOSCOPY (EGD) Biliary STENT removal;  Surgeon: Doran Stabler, MD;  Location: [facility] ENDOSCOPY;  Service: Gastroenterology;  Laterality: N/A;   IR GENERIC HISTORICAL  12/17/2015   IR US GUIDE BX ASP/DRAIN 12/17/2015 [facility]   IR GENERIC HISTORICAL  12/17/2015   IR GUIDED DRAIN W CATHETER PLACEMENT 12/17/2015 [facility]   IR GENERIC HISTORICAL  12/17/2015   IR SINUS/FIST TUBE CHK-NON GI 12/17/2015 [facility]   KNEE SURGERY     LUNG SURGERY     TEE WITHOUT CARDIOVERSION N/A 12/19/2015   Procedure: TRANSESOPHAGEAL ECHOCARDIOGRAM (TEE);  Surgeon: Josue Hector, MD;  Location: [facility] ENDOSCOPY;  Service: Cardiovascular;  Laterality: N/A;   Past Surgical History:  Procedure Laterality Date   ABDOMINAL HYSTERECTOMY     ANTERIOR CERVICAL DECOMP/DISCECTOMY FUSION N/A 05/24/2020   Procedure: ANTERIOR CERVICAL DECOMPRESSION/DISCECTOMY FUSION 1 LEVEL C3-4;  Surgeon: Melina Schools, MD;  Location: [place];  Service: Orthopedics;  Laterality: N/A;  3 hrs   BILIARY STENT PLACEMENT N/A 12/12/2015   Procedure: BILIARY STENT PLACEMENT;  Surgeon: Doran Stabler, MD;  Location: [place];  Service: Endoscopy;  Laterality: N/A;   BLADDER REPAIR     CARPAL TUNNEL RELEASE     CERVICAL FUSION     CERVICAL SPINE SURGERY     CHOLECYSTECTOMY N/A 12/06/2015   Procedure: LAPAROSCOPIC CHOLECYSTECTOMY WITH  INTRAOPERATIVE CHOLANGIOGRAM;  Surgeon: Stark Klein, MD;  Location: [place];  Service: General;  Laterality: N/A;   ELBOW SURGERY     ERCP N/A 12/12/2015   Procedure: ENDOSCOPIC RETROGRADE  CHOLANGIOPANCREATOGRAPHY (ERCP);  Surgeon: Doran Stabler, MD;  Location: [facility] ENDOSCOPY;  Service: Endoscopy;  Laterality: N/A;   ESOPHAGOGASTRODUODENOSCOPY N/A 01/28/2016   Procedure: ESOPHAGOGASTRODUODENOSCOPY (EGD) Biliary STENT removal;  Surgeon: Doran Stabler, MD;  Location: [facility] ENDOSCOPY;  Service: Gastroenterology;  Laterality: N/A;   IR GENERIC HISTORICAL  12/17/2015   IR US GUIDE BX ASP/DRAIN 12/17/2015 [facility]   IR GENERIC HISTORICAL  12/17/2015   IR GUIDED DRAIN W CATHETER PLACEMENT 12/17/2015 [facility]   IR GENERIC HISTORICAL  12/17/2015   IR SINUS/FIST TUBE CHK-NON GI 12/17/2015 [facility]   KNEE SURGERY     LUNG SURGERY     TEE WITHOUT CARDIOVERSION N/A 12/19/2015   Procedure: TRANSESOPHAGEAL ECHOCARDIOGRAM (TEE);  Surgeon: Josue Hector, MD;  Location: [place];  Service: Cardiovascular;  Laterality: N/A;  Past Medical History:  Diagnosis Date   Anemia    Cervical neuritis    Chronic kidney disease    stage 3   Dyslipidemia    GERD (gastroesophageal reflux disease)    History of transient ischemic attack (TIA)    HTN (hypertension)    Hyperlipidemia    Migraine    Migraine headache    Occipital neuralgia    Left   Pulmonary embolism ([place]) 2017   Renal insufficiency    Stroke ([place]) 10/2013   BP (!) 149/85   Pulse 87   Temp 98 F (36.7 C)   Ht '5\' 7"'$  (1.702 m)   Wt 172 lb 12.8 oz (78.4 kg)   SpO2 97%   BMI 27.06 kg/m   Opioid Risk Score:   Fall Risk Score:  `1  Depression screen PHQ 2/9  Depression screen [facility] 2/9 08/09/2020 07/24/2020 07/24/2020 05/22/2020 05/08/2020 01/24/2020 01/03/2020  Decreased Interest '1 1 1 1 1 1 1  '$ Down, Depressed, Hopeless 1 0 0 '1 1 1 1  '$ PHQ - 2 Score '2 1 1 2 2 2 2  '$ Altered sleeping - 0 0 1 - - 1  Tired, decreased energy - '1 1 1 '$ - - 1  Change in appetite - 0 0 1 - - 1  Feeling bad or failure about yourself  - 0 0 2 - - 1  Trouble concentrating - 0 0 0 - - 1  Moving slowly or fidgety/restless - 0 0 2 - - 1   Suicidal thoughts - 0 0 0 - - 0  PHQ-9 Score - '2 2 9 '$ - - 8  Difficult doing work/chores - Not difficult at all - - - - Not difficult at all  Some recent data might be hidden     Review of Systems  Constitutional: Negative.   HENT: Negative.    Eyes: Negative.   Respiratory: Negative.    Cardiovascular: Negative.   Gastrointestinal: Negative.   Endocrine: Negative.   Genitourinary: Negative.   Musculoskeletal:  Positive for back pain.  Skin: Negative.   Allergic/Immunologic: Negative.   Neurological: Negative.   Hematological: Negative.   Psychiatric/Behavioral: Negative.    All other systems reviewed and are negative.     Objective:   Physical Exam Constitutional:      Appearance: She is obese.  HENT:     Head: Normocephalic and atraumatic.  Eyes:     Extraocular Movements: Extraocular movements intact.     Conjunctiva/sclera: Conjunctivae normal.     Pupils: Pupils are equal, round, and reactive to light.  Musculoskeletal:     Cervical back: Normal range of motion and neck supple.     Right lower leg: No edema.     Left lower leg: No edema.     Comments: Positive thigh thrust test bilaterally Positive distraction test bilaterally Negative straight leg raising test bilaterally  Neurological:     General: No focal deficit present.     Mental Status: She is alert and oriented to person, place, and time.     Comments: Motor strength is 5/5 bilateral deltoid bicep tricep grip hip flexor knee extensor ankle dorsiflexors plantar flexors    Sensation intact bilateral lower extremities      Assessment & Plan:   1.  Chronic low back and buttock pain.  Positive physical exam findings she has responded very well to sacroiliac nerve blocks, 75% relief on the right side and 100% relief on the left side.  She is more  symptomatic on the left side currently and will recommend radiofrequency neurotomy L5 dorsal ramus S1 is to S3 lateral branches for a more durable pain relief.   We discussed that average pain relief is 6 to 61-monthrange. This can be repeated after 6 months if needed. If she has significant right-sided pain after left side is treated may have to do right-sided sacroiliac RF as well Discussed with patient agrees with plan  #2.  Sciatica left lower extremity this is not a primary pain issue we will continue gabapentin.  At this point do not think she would benefit from epidural injections.

## 2020-11-20 NOTE — Progress Notes (Signed)
Pre procedure instructions reviewed for 01/11/21 radiofrequency neurotomy SI. Pt is taking Plavix but does not need to hold for procedure.

## 2020-11-21 NOTE — Telephone Encounter (Signed)
Pt is returning a call from Friday. Pt stated that she will no longer be having surgery. It has been postponed until January of 2023

## 2020-11-23 DIAGNOSIS — N2581 Secondary hyperparathyroidism of renal origin: Secondary | ICD-10-CM | POA: Diagnosis not present

## 2020-11-23 DIAGNOSIS — D631 Anemia in chronic kidney disease: Secondary | ICD-10-CM | POA: Diagnosis not present

## 2020-11-23 DIAGNOSIS — R3129 Other microscopic hematuria: Secondary | ICD-10-CM | POA: Diagnosis not present

## 2020-11-23 DIAGNOSIS — I10 Essential (primary) hypertension: Secondary | ICD-10-CM | POA: Diagnosis not present

## 2020-11-23 DIAGNOSIS — N183 Chronic kidney disease, stage 3 unspecified: Secondary | ICD-10-CM | POA: Diagnosis not present

## 2020-11-27 NOTE — Telephone Encounter (Signed)
Left message for the pt to call back and schedule an appt for Nov per pre op provider Melina Copa, PAC. See notes where pt's states her surgery is postponed until 03/2021.

## 2020-11-27 NOTE — Telephone Encounter (Signed)
   Patient Name: [name]  DOB: Jan 28, 1951 MRN: [id number]  Primary Cardiologist: Skeet Latch, MD  Chart reviewed as part of pre-operative protocol coverage. Note below indicates patient will no longer be having surgery, has been postponed until January 2023. Would recommend scheduling her an office follow-up so we can address clearance in person closer to that time. At last OV 05/2020 she was recommended to f/u 3-4 months but I do not see this occurred. If she's symptomatically stable and vital signs are normal, would perhaps try for November appt with Dr. Oval Linsey or Almyra Deforest. Otherwise if any symptom concerns OK to schedule sooner.  [name], PA-C 11/27/2020, 2:49 PM

## 2020-11-28 NOTE — Telephone Encounter (Signed)
2nd attempt to reach pt. Left a message to call and schedule an appointment in November for her procedure in January 2023.

## 2020-12-03 NOTE — Telephone Encounter (Signed)
Pt has appt with Dr. Oval Linsey 01/17/21. Will forward these notes to MD for upcoming appt. Will send FYI to requesting office pt has appt 01/17/21.

## 2020-12-07 ENCOUNTER — Other Ambulatory Visit: Payer: Self-pay | Admitting: Registered Nurse

## 2020-12-07 ENCOUNTER — Other Ambulatory Visit: Payer: Self-pay | Admitting: Physical Medicine & Rehabilitation

## 2020-12-10 ENCOUNTER — Other Ambulatory Visit: Payer: Self-pay | Admitting: Student in an Organized Health Care Education/Training Program

## 2021-01-11 ENCOUNTER — Other Ambulatory Visit: Payer: Self-pay

## 2021-01-11 ENCOUNTER — Encounter: Payer: Self-pay | Admitting: Physical Medicine & Rehabilitation

## 2021-01-11 ENCOUNTER — Encounter: Payer: Medicare Other | Attending: Physical Medicine & Rehabilitation | Admitting: Physical Medicine & Rehabilitation

## 2021-01-11 VITALS — BP 129/77 | HR 90 | Temp 99.0°F | Ht 67.0 in | Wt 183.0 lb

## 2021-01-11 DIAGNOSIS — G8929 Other chronic pain: Secondary | ICD-10-CM | POA: Insufficient documentation

## 2021-01-11 DIAGNOSIS — M533 Sacrococcygeal disorders, not elsewhere classified: Secondary | ICD-10-CM | POA: Diagnosis not present

## 2021-01-11 NOTE — Progress Notes (Signed)
Sacroiliac radio frequency ablation under fluoroscopic guidance This consists of LEFT L5 dorsal ramus radio frequency ablation plus RF of  LEFT S1-S2 -S3 lateral branches  Indication is sacroiliac pain which has improved temporarily onby at least 50% following sacroiliac intra-articular injection and L5 , S1,2,3 Lateral branch blocks under fluoroscopic guidance. Pain interferes with self-care and mobility and has failed to respond to conservative measures.  Informed consent was obtained after discussing risks and benefits of the procedure with the patient these include bleeding bruising and infection temporary or permanent paralysis. The patient elects to proceed and has given written consent.  Patient placed prone on fluoroscopy table. Area marked and prepped with Betadine. Fluoroscopic images utilized to guide needle. 25-gauge 1.5 inch needle was used to anesthetize 4 injection points with 2 cc of 1% lidocaine each. Then a 18-gauge 10 cm RF needle with a 10 mm curved active tip was inserted under fluoroscopic guidance first targeting the S1 SA P./sacral ala junction, bone contact made and confirmed with lateral imaging.  motor stimulation at 2 Hz confirm proper needle location followed by injection of one cc of a solution containing   2% lidocaine MPF. Radio frequency 80C for 80 seconds was performed. Then the inferolateral aspect of the S1, S2 and lateral aspect of S3 sacral foramina were targeted. Bone contact made.  motor stim at 2 Hz confirm proper needle location. One ML of the /lidocaine solution was injected into each of 3 sites and radio frequency ablation 80C for 90 seconds was performed. Patient tolerated procedure well. Post procedure instructions given

## 2021-01-11 NOTE — Patient Instructions (Signed)
You had a radio frequency procedure today This was done to alleviate joint pain in your sacral area We injected lidocaine which is a local anesthetic.  You may experience soreness at the injection sites. You may also experienced some irritation of the nerves that were heated I'm recommending ice for 30 minutes every 2 hours as needed for the next 24-48 hours   

## 2021-01-11 NOTE — Progress Notes (Signed)
  PROCEDURE RECORD [facility]   Name: SHADI LARNER DOB:01-Mar-1951 MRN: [id number]  Date:01/11/2021  Physician: Alysia Penna, MD    Nurse/CMA: Truman Hayward, CMA  Allergies:  Allergies  Allergen Reactions   Shellfish Allergy Anaphylaxis    All seafood   Iodine    Statins Other (See Comments)    Arthralgias (severe) with atorvastatin, mild arthralgias with rosuvastatin but willing to continue taking it    Consent Signed: Yes.    Is patient diabetic? No.  CBG today? N/A  Pregnant: No. LMP: No LMP recorded. Patient has had a hysterectomy. (age [age])  Anticoagulants: no Anti-inflammatory: no Antibiotics: no  Procedure: Left Sacroiliac injection Radiofrequency  Position: Prone Start Time: 1:12 pm  End Time: 1:40 pm  Fluoro Time: 48  RN/CMA Malka Bocek MA Lee, CMA    Time 12:54 PM 1:45 pm    BP 129/77 153/88    Pulse 90 85    Respirations 16 16    O2 Sat 95 94    S/S 6 6    Pain Level 9/10 3/10     D/C home with SELF, [name] & O X 3, D/C instructions reviewed, and sits independently.

## 2021-01-17 ENCOUNTER — Ambulatory Visit (HOSPITAL_BASED_OUTPATIENT_CLINIC_OR_DEPARTMENT_OTHER): Payer: Medicare Other | Admitting: Cardiovascular Disease

## 2021-01-24 DIAGNOSIS — S42201S Unspecified fracture of upper end of right humerus, sequela: Secondary | ICD-10-CM | POA: Diagnosis not present

## 2021-02-08 DIAGNOSIS — M170 Bilateral primary osteoarthritis of knee: Secondary | ICD-10-CM | POA: Diagnosis not present

## 2021-02-09 ENCOUNTER — Other Ambulatory Visit: Payer: Self-pay | Admitting: Student

## 2021-02-19 ENCOUNTER — Telehealth: Payer: Self-pay | Admitting: Family Medicine

## 2021-02-19 NOTE — Telephone Encounter (Signed)
LVM relaying AL,NP note. Asked pt to call back to discuss

## 2021-02-19 NOTE — Telephone Encounter (Signed)
Called the pt back. Pt confirmed that she has tizanidine 2 mg tablet left over. She states that she has enough to get her through to next week. Advised that Amy recommended that she try 4 mg  every 8 hrs prn. Advised that when she starts to get low it she finds that strength is helping then we can send a refill in for her for that dose. Advised if it is not helping also let us know and we can send in the alternative option. She verbalized understanding of the plan and was agreeable to this

## 2021-02-19 NOTE — Telephone Encounter (Signed)
Pt is asking for a call back from [place], RN she did confirm that she has a little of her tizanidine left.

## 2021-02-19 NOTE — Telephone Encounter (Signed)
Called pt back. Current migraine not presenting as her normal migraine. Current migraine starts on one side and radiates up to middle of head. Moves and goes up other side. Starts in back of neck as well. Pain sometimes in ear. Unable to do anything right now d/t pain. Has to stay in her room laying down. She has only been taking zonisamide 100mg  po BID. I recommended she increase to the 125mg  total po BID as previously recommended. Still taking gabapentin 600mg  po BID.  She has increased water intake and eating well balanced diet. Denies any signs/sx of infections/sickness. Tried sinus meds last night but ineffective. Aware I will send to AL,NP for any further recommendation and call back.

## 2021-02-19 NOTE — Telephone Encounter (Signed)
Pt states for the last couple of days she has a migraine that starts on the left side of her head and goes up.  Pt states if she moves the wrong way it goes to the right side of her head and it is very painful.  Pt states this is not affecting her vision.  Please call.

## 2021-02-25 NOTE — Progress Notes (Signed)
PATIENT: [name] DOB: 29-Nov-1950  REASON FOR VISIT: follow up HISTORY FROM: patient  Chief Complaint  Patient presents with   Follow-up    Rm 1, alone. Here for ongoing HA. Pt reports current migraine is not getting any better. She is taking tizanidine 4mg  po q 8hr, gabapentin 600mg  po BID and zonisamide 125mg  po BID.        HISTORY OF PRESENT ILLNESS: 02/26/2021 ALL: [name] returns with concerns of worsening headaches. She continues zonisamide 125mg  daily. She reports worsening over the past 1-2 months. Headaches are more frequent (near daily) and more intense. Similar symptoms has previous headaches. No new symptoms. Most are present in the morning. Can worsen during the day. She has had an intractable migraine for the past 3 days. Tizanidine and Tylenol usually abort migraines but she has not gotten much relief. She continues doxepin 200mg  QHS for insomnia managed with our office. Trazodone 25-50 prescribed with PCP. She has interrupted sleep. She does snore. She wakes multiple times at night. She does not wake refreshed. She does have dry mouth. Brother has sleep apnea on CPAP.   Medications tried and failed: Ajovy (ineffective), topiramate, zonisamide (on now), trazodone (on now), sertraline (on now), gabapentin (on now), propranolol, Nurtec, tramadol (takes at night), tizanidine, ondansetron, cyclobenzaprine, metoclopramide, triptans contraindicated due to PVD, CVA and TIA history   10/10/2020 ALL: [name] returns for follow up for migraines and insomnia. She is doing well. She continues zonisamide 100mg  BID and doxepin 200mg  at bedtime. She has had several stressors since we last saw her. Her brother had to have emergency surgery so she has had to help care for him and his hair salon.  She is seeing Dr Rolena Infante, [facility], and Dr Letta Pate, sports med, for chronic neck and back pain. Dr Letta Pate has her on gabapentin 600mg  twice daily that helps some. Dr Stann Mainland for broken  shoulder and Dr Margit Banda for chronic knee pain.   Headaches are occurring most days. She feels that she has pain starting in her neck that radiates to the top of her head everyday around 3pm. She has not tried taking extra 25mg  dose of imipramine. She is sleeping well. She is tolerating doxepin.   10/10/2019 ALL:  [name] is a 70 y.o. female here today for follow up for headaches. She continues zonisamide 100mg  daily. She has a 25mg  dose that she rarely takes. She has daily headaches, mostly in the mornings, that are usually easily treatable. She does note that weather changes are a trigger for her. She is able to abort headaches with rest. She may have 1-2 migraines per month.   She continues doxepin 200mg  at night for insomnia. She has been on this medication for years. She reports that she has a difficult time sleeping. She struggles to go to sleep at night and to stay asleep. She does wake frequently at night to use the restroom. She wakes with a dry mouth. She is not sure if she snores. She has a brother who uses CPAP therapy.   HISTORY: (copied from my note on 12/09/2018)  [name] is a 70 y.o. female here today for follow up for migraines. She started Ajovy about a year ago. She felt that it helped initially but after a few months it did not seem to help. She has daily dull headaches, usually behind the right eye. About 1-2 times a month she has more severe headache with light, sound sensitivity and nausea. She continues  zonisamide 100mg  and tizanidine 2mg  daily. She takes gabapentin 600mg  1-2 times daily for headaches and dysesthesias in her left side. Doxepin helps with sleep. She is followed closely by PCP for BP and cholesterol management. She is intolerant to statins, now on Repatha. On Plavix.    HISTORY: (copied from [place] note on 03/06/2018)   [name] is a 70 year old female with a history of intractable migraine headaches.  She returns today for follow-up.   Overall she feels that she has done relatively well.  He continues to have a daily dull headache that is in the right temporal region.  However she states that she has not had a severe headache in the last 6 months.  She reports she does have photophobia with her headaches.  She states that if she misses her medication then her headache will worsen.  She reports that she was unable to obtain Aimovig.  She continues on Zonegran and tizanidine.   HISTORY 07/01/17 [name] is a 70 year old female with a history of intractable migraine headaches.  She returns today for follow-up.  She reports that she continues to have a daily dull headache.  She remains on Zonegran and tizanidine.  She states that she did receive a letter about Aimovig but was unable to follow-up on this due to her brother being in the hospital.  She states that she only has approximately one severe migraine a month.  Her headaches seem to occur on the right side starting at the neck radiating to the eyes.  She does have photophobia, phonophobia and nausea.  She continues to take doxepin at night.  She returns today for an evaluation.    REVIEW OF SYSTEMS: Out of a complete 14 system review of symptoms, the patient complains only of the following symptoms, morning headaches, dry mouth, fatigue, chronic pain, insomnia and all other reviewed systems are negative.  ALLERGIES: Allergies  Allergen Reactions   Shellfish Allergy Anaphylaxis    All seafood   Iodine    Statins Other (See Comments)    Arthralgias (severe) with atorvastatin, mild arthralgias with rosuvastatin but willing to continue taking it    HOME MEDICATIONS: Outpatient Medications Prior to Visit  Medication Sig Dispense Refill   albuterol (VENTOLIN HFA) 108 (90 Base) MCG/ACT inhaler Inhale 2 puffs into the lungs every 6 (six) hours as needed for wheezing or shortness of breath. 8 g 6   cetirizine (ZYRTEC) 10 MG tablet Take 10 mg by mouth daily with breakfast.      clopidogrel (PLAVIX) 75 MG tablet Take 75 mg by mouth daily.     doxepin (SINEQUAN) 100 MG capsule Take 2 capsules (200 mg total) by mouth at bedtime. 180 capsule 3   ezetimibe (ZETIA) 10 MG tablet Take 10 mg by mouth daily.     fluticasone (FLONASE) 50 MCG/ACT nasal spray Place 1 spray into both nostrils daily. (Patient taking differently: Place 1 spray into both nostrils daily as needed for allergies.)  2   furosemide (LASIX) 40 MG tablet Take 40 mg by mouth daily.     gabapentin (NEURONTIN) 600 MG tablet Take 1 tablet (600 mg total) by mouth 2 (two) times daily. 60 tablet 11   ondansetron (ZOFRAN) 4 MG tablet Take 1 tablet (4 mg total) by mouth every 8 (eight) hours as needed for nausea or vomiting. 20 tablet 0   pantoprazole (PROTONIX) 40 MG tablet TAKE 1 TABLET(40 MG) BY MOUTH TWICE DAILY 90 tablet 1   sertraline (ZOLOFT) 25 MG  tablet TAKE 1 TABLET(25 MG) BY MOUTH DAILY. 90 tablet 1   sertraline (ZOLOFT) 50 MG tablet TAKE 1 TABLET(50 MG) BY MOUTH DAILY 60 tablet 1   traMADol (ULTRAM) 50 MG tablet Take 1 tablet (50 mg total) by mouth every 8 (eight) hours as needed for moderate pain. 90 tablet 2   traZODone (DESYREL) 50 MG tablet TAKE 1/2 TO 1 TABLET(25 TO 50 MG) BY MOUTH AT BEDTIME AS NEEDED FOR SLEEP 90 tablet 0   zonisamide (ZONEGRAN) 100 MG capsule Take by mouth.     zonisamide (ZONEGRAN) 25 MG capsule take 25 mg twice daily prn in addition to 100 mg twice daily. 180 capsule 3   amLODipine (NORVASC) 10 MG tablet Take 1 tablet (10 mg total) by mouth daily. 180 tablet 3   polyethylene glycol powder (GLYCOLAX/MIRALAX) 17 GM/SCOOP powder take 1 packet by oral route  every day mixed with 8 oz. water, juice, soda, coffee or tea     No facility-administered medications prior to visit.    PAST MEDICAL HISTORY: Past Medical History:  Diagnosis Date   Anemia    Cervical neuritis    Chronic kidney disease    stage 3   Dyslipidemia    GERD (gastroesophageal reflux disease)    History of  transient ischemic attack (TIA)    HTN (hypertension)    Hyperlipidemia    Migraine    Migraine headache    Occipital neuralgia    Left   Pulmonary embolism ([place]) 2017   Renal insufficiency    Stroke ([place]) 10/2013    PAST SURGICAL HISTORY: Past Surgical History:  Procedure Laterality Date   ABDOMINAL HYSTERECTOMY     ANTERIOR CERVICAL DECOMP/DISCECTOMY FUSION N/A 05/24/2020   Procedure: ANTERIOR CERVICAL DECOMPRESSION/DISCECTOMY FUSION 1 LEVEL C3-4;  Surgeon: Melina Schools, MD;  Location: [facility];  Service: Orthopedics;  Laterality: N/A;  3 hrs   BILIARY STENT PLACEMENT N/A 12/12/2015   Procedure: BILIARY STENT PLACEMENT;  Surgeon: Doran Stabler, MD;  Location: [facility];  Service: Endoscopy;  Laterality: N/A;   BLADDER REPAIR     CARPAL TUNNEL RELEASE     CERVICAL FUSION     CERVICAL SPINE SURGERY     CHOLECYSTECTOMY N/A 12/06/2015   Procedure: LAPAROSCOPIC CHOLECYSTECTOMY WITH  INTRAOPERATIVE CHOLANGIOGRAM;  Surgeon: Stark Klein, MD;  Location: [facility];  Service: General;  Laterality: N/A;   ELBOW SURGERY     ERCP N/A 12/12/2015   Procedure: ENDOSCOPIC RETROGRADE CHOLANGIOPANCREATOGRAPHY (ERCP);  Surgeon: Doran Stabler, MD;  Location: [facility] ENDOSCOPY;  Service: Endoscopy;  Laterality: N/A;   ESOPHAGOGASTRODUODENOSCOPY N/A 01/28/2016   Procedure: ESOPHAGOGASTRODUODENOSCOPY (EGD) Biliary STENT removal;  Surgeon: Doran Stabler, MD;  Location: [facility] ENDOSCOPY;  Service: Gastroenterology;  Laterality: N/A;   IR GENERIC HISTORICAL  12/17/2015   IR US GUIDE BX ASP/DRAIN 12/17/2015 [facility]   IR GENERIC HISTORICAL  12/17/2015   IR GUIDED DRAIN W CATHETER PLACEMENT 12/17/2015 [facility]   IR GENERIC HISTORICAL  12/17/2015   IR SINUS/FIST TUBE CHK-NON GI 12/17/2015 [facility]   KNEE SURGERY     LUNG SURGERY     TEE WITHOUT CARDIOVERSION N/A 12/19/2015   Procedure: TRANSESOPHAGEAL ECHOCARDIOGRAM (TEE);  Surgeon: Josue Hector, MD;  Location: [facility] ENDOSCOPY;  Service:  Cardiovascular;  Laterality: N/A;    FAMILY HISTORY: Family History  Problem Relation Age of Onset   Cancer Mother 76       breast   Alzheimer's disease Mother    Prostate  cancer Father    Cancer Brother    Hyperlipidemia Brother    Lung cancer Father    Dementia Maternal Grandmother    Cancer Maternal Grandfather    Cancer Brother    Hyperlipidemia Brother    Dementia Brother        frontotemporal dementia    SOCIAL HISTORY: Social History   Socioeconomic History   Marital status: Married    Spouse name: [name]   Number of children: 1   Years of education: 12+   Highest education level: High school graduate  Occupational History   Occupation: ACTIVITY DIRECTOR    Employer: [employer]   Occupation: CNA  Tobacco Use   Smoking status: Former    Packs/day: 0.30    Types: Cigarettes    Quit date: 11/01/2011    Years since quitting: 9.3   Smokeless tobacco: Never  Vaping Use   Vaping Use: Never used  Substance and Sexual Activity   Alcohol use: No    Alcohol/week: 0.0 standard drinks   Drug use: No   Sexual activity: Yes    Partners: Male    Birth control/protection: Surgical    Comment: 1 sexual partner in last 12 months: married  Other Topics Concern   Not on file  Social History Narrative   ** Merged History Encounter **       Patient is married ([name]). Patient has one child and grandchild. Her sons name is Shanon Brow. Patient sees them often. 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 Patient spends 6-10 hours with her 6 days a week.     Patient has a college education. Patient is very active in her church.  Hobbies- outside crafts and church choir.   Social Determinants of Health   Financial Resource Strain: Not on file  Food Insecurity: Not on file  Transportation Needs: Not on file  Physical Activity: Not on file  Stress: Not on file  Social Connections: Not on file  Intimate Partner Violence: Not  on file      PHYSICAL EXAM  Vitals:   02/26/21 1535  BP: 137/83  Pulse: (!) 105  Weight: 183 lb 8 oz (83.2 kg)  Height: 5\' 7"  (1.702 m)     Body mass index is 28.74 kg/m.  Generalized: Well developed, in no acute distress  Cardiology: normal rate and rhythm, no murmur noted Respiratory: clear to auscultation bilaterally  Neurological examination  Mentation: Alert oriented to time, place, history taking. Follows all commands speech and language fluent Cranial nerve II-XII: Pupils were equal round reactive to light. Extraocular movements were full, visual field were full on confrontational test. Facial sensation and strength were normal. Uvula tongue midline. Head turning and shoulder shrug  were normal and symmetric. Motor: The motor testing reveals 5 over 5 strength of all 4 extremities. Good symmetric motor tone is noted throughout.  Sensory: Sensory testing is intact to soft touch on all 4 extremities. No evidence of extinction is noted.   Gait and station: Gait is stable with cane    DIAGNOSTIC DATA (LABS, IMAGING, TESTING) - I reviewed patient records, labs, notes, testing and imaging myself where available.  No flowsheet data found.   Lab Results  Component Value Date   WBC 11.5 (H) 05/22/2020   HGB 13.6 05/22/2020   HCT 42.0 05/22/2020   MCV 84 05/22/2020   PLT 322 05/22/2020      Component Value Date/Time   NA  139 07/20/2020 1108   K 3.5 07/20/2020 1108   CL 99 07/20/2020 1108   CO2 23 07/20/2020 1108   GLUCOSE 123 (H) 07/20/2020 1108   GLUCOSE 135 (H) 08/29/2019 1030   BUN 12 07/20/2020 1108   CREATININE 1.18 (H) 07/20/2020 1108   CREATININE 1.10 (H) 02/25/2016 0907   CALCIUM 8.9 07/20/2020 1108   PROT 7.0 08/29/2019 1030   PROT 6.5 08/25/2019 1351   ALBUMIN 4.1 08/29/2019 1030   ALBUMIN 4.4 08/25/2019 1351   AST 24 08/29/2019 1030   ALT 18 08/29/2019 1030   ALKPHOS 159 (H) 08/29/2019 1030   BILITOT 0.8 08/29/2019 1030   BILITOT <0.2 08/25/2019  1351   GFRNONAA >60 08/29/2019 1030   GFRNONAA 53 (L) 02/25/2016 0907   GFRAA >60 08/29/2019 1030   GFRAA 61 02/25/2016 0907   Lab Results  Component Value Date   CHOL 210 (H) 06/13/2019   HDL 48 06/13/2019   LDLCALC 123 (H) 06/13/2019   LDLDIRECT 156 (H) 03/27/2017   TRIG 224 (H) 06/13/2019   CHOLHDL 4.4 06/13/2019   Lab Results  Component Value Date   HGBA1C 5.5 07/30/2017   Lab Results  Component Value Date   VITAMINB12 290 07/21/2017   Lab Results  Component Value Date   TSH 1.610 11/03/2018     ASSESSMENT AND PLAN 70 y.o. year old female  has a past medical history of Anemia, Cervical neuritis, Chronic kidney disease, Dyslipidemia, GERD (gastroesophageal reflux disease), History of transient ischemic attack (TIA), HTN (hypertension), Hyperlipidemia, Migraine, Migraine headache, Occipital neuralgia, Pulmonary embolism ([place]) (2017), Renal insufficiency, and Stroke ([place]) (10/2013). here with     ICD-10-CM   1. Migraine without aura and without status migrainosus, not intractable  G43.009     2. Insomnia, unspecified type  G47.00     3. Morning headache  R51.9 Ambulatory referral to Sleep Studies       [name] reports that daily headaches continue and migrainous symptoms are more frequent. We will continues zonisamide 125mg  daily. I will call in prednisone taper and Nurtec for migraine abortion. She may take 1 Nurtec daily x 3-6 days. I have provided 6 sample tablets, today. She will call me if migraines do not continue to improve. Will start Emgality as discussed if prevention medication needed. There are multiple concerns for possible OSA including morning headaches, snoring, dry mouth and family history of OSA. I have placed a referral to sleep med for evaluation. Insomnia is better on doxepin 200mg  at bedtime. She will continue working on healthy lifestyle habits. We will follow up pending sleep evaluation.    Orders Placed This Encounter  Procedures   Ambulatory  referral to Sleep Studies    Referral Priority:   Routine    Referral Type:   Consultation    Referral Reason:   Specialty Services Required    Number of Visits Requested:   1      Meds ordered this encounter  Medications   tiZANidine (ZANAFLEX) 4 MG tablet    Sig: Take 1 tablet (4 mg total) by mouth every 6 (six) hours as needed for muscle spasms.    Dispense:  60 tablet    Refill:  2    Order Specific Question:   Supervising Provider    Answer:   Melvenia Beam [[id number]]   methylPREDNISolone (MEDROL DOSEPAK) 4 MG TBPK tablet    Sig: Take as directed    Dispense:  1 each    Refill:  0  Order Specific Question:   Supervising Provider    Answer:   Melvenia Beam [[id number]]   Rimegepant Sulfate (NURTEC) 75 MG TBDP    Sig: Take 75 mg by mouth daily as needed (take for abortive therapy of migraine, no more than 1 tablet in 24 hours or 10 per month).    Dispense:  10 tablet    Refill:  11    Order Specific Question:   Supervising Provider    Answer:   Melvenia Beam [[id number]]        SYH NPMVA, FNP-C 02/26/2021, 4:22 PM [facility] [address], [place], [place] [address] [phone]

## 2021-02-25 NOTE — Telephone Encounter (Signed)
Pt called, want to let Amy, NP know that the tizanidine is not working. Would like a call from the nurse.

## 2021-02-25 NOTE — Telephone Encounter (Signed)
Called pt back. She is still having same pain as described previously, has not improved. Confirmed she is taking tizanidine 4mg  po q 8hr, gabapentin 600mg  po BID and zonisamide 125mg  po BID.  Advised it would be best to bring her in for appt at this time for further evaluation/discuss treatment plan. Scheduled appt w/ AL,NP 02/26/21 at 3:30p. Asked she check in at 3pm, bring updated med list and insurance cards. She verbalized understanding.

## 2021-02-26 ENCOUNTER — Ambulatory Visit (INDEPENDENT_AMBULATORY_CARE_PROVIDER_SITE_OTHER): Payer: Medicare Other | Admitting: Family Medicine

## 2021-02-26 ENCOUNTER — Encounter: Payer: Self-pay | Admitting: Family Medicine

## 2021-02-26 VITALS — BP 137/83 | HR 105 | Ht 67.0 in | Wt 183.5 lb

## 2021-02-26 DIAGNOSIS — R519 Headache, unspecified: Secondary | ICD-10-CM | POA: Diagnosis not present

## 2021-02-26 DIAGNOSIS — G43009 Migraine without aura, not intractable, without status migrainosus: Secondary | ICD-10-CM | POA: Diagnosis not present

## 2021-02-26 DIAGNOSIS — G4700 Insomnia, unspecified: Secondary | ICD-10-CM | POA: Diagnosis not present

## 2021-02-26 MED ORDER — METHYLPREDNISOLONE 4 MG PO TBPK: ORAL_TABLET | ORAL | 0 refills | Status: DC

## 2021-02-26 MED ORDER — NURTEC 75 MG PO TBDP: 75.0000 mg | ORAL_TABLET | Freq: Every day | ORAL | 11 refills | Status: DC | PRN

## 2021-02-26 MED ORDER — TIZANIDINE HCL 4 MG PO TABS: 4.0000 mg | ORAL_TABLET | Freq: Four times a day (QID) | ORAL | 2 refills | Status: DC | PRN

## 2021-02-26 NOTE — Patient Instructions (Signed)
Below is our plan:  We will continue current plan. I have called in a medrol taper pack to the pharmacy. Take as directed. Take 1 Nurtec tablet with 1000mg  Tylenol daily for the 6 days.  Please make sure you are staying well hydrated. I recommend 50-60 ounces daily. Well balanced diet and regular exercise encouraged. Consistent sleep schedule with 6-8 hours recommended.   Please continue follow up with care team as directed.   Follow up with sleep medicine asap   You may receive a survey regarding today's visit. I encourage you to leave honest feed back as I do use this information to improve patient care. Thank you for seeing me today!

## 2021-02-28 ENCOUNTER — Telehealth: Payer: Self-pay

## 2021-02-28 NOTE — Telephone Encounter (Signed)
PA for Nurtec has been approved. (Key: [id number])  This request has been approved.  Please note any additional information provided by [facility] at the bottom of your screen. [id number]:[id number];Review Type:Prior Auth;Coverage Start Date:01/29/2021;Coverage End Date:02/28/2022;;CaseId:[id number];Status:Approved;Review Type:Qty;Coverage Start Date:01/29/2021;Coverage End Date:02/28/2022;

## 2021-03-05 ENCOUNTER — Other Ambulatory Visit: Payer: Self-pay | Admitting: Student in an Organized Health Care Education/Training Program

## 2021-03-05 ENCOUNTER — Telehealth: Payer: Self-pay | Admitting: Family Medicine

## 2021-03-05 NOTE — Telephone Encounter (Signed)
Upon reviewing the patient's chart, it looks like Dr Letta Pate prescribes this for the patient. Advised that per our notes and the previous prescriptions it looks like this is managed by Dr Letta Pate office. Pt verbalized understanding and will contact their office

## 2021-03-05 NOTE — Telephone Encounter (Signed)
Pt is needing a refill request for her Gabapentin 300mg  sent in to the [facility] on [address] [place]

## 2021-03-15 ENCOUNTER — Emergency Department (HOSPITAL_BASED_OUTPATIENT_CLINIC_OR_DEPARTMENT_OTHER): Payer: Medicare Other

## 2021-03-15 ENCOUNTER — Other Ambulatory Visit: Payer: Self-pay

## 2021-03-15 ENCOUNTER — Encounter (HOSPITAL_BASED_OUTPATIENT_CLINIC_OR_DEPARTMENT_OTHER): Payer: Self-pay

## 2021-03-15 ENCOUNTER — Emergency Department (HOSPITAL_BASED_OUTPATIENT_CLINIC_OR_DEPARTMENT_OTHER)
Admission: EM | Admit: 2021-03-15 | Discharge: 2021-03-15 | Disposition: A | Discharge status: Home / Self Care | Payer: Medicare Other | Attending: Emergency Medicine | Admitting: Emergency Medicine

## 2021-03-15 DIAGNOSIS — Z87891 Personal history of nicotine dependence: Secondary | ICD-10-CM | POA: Insufficient documentation

## 2021-03-15 DIAGNOSIS — S0990XA Unspecified injury of head, initial encounter: Secondary | ICD-10-CM | POA: Diagnosis present

## 2021-03-15 DIAGNOSIS — S0101XA Laceration without foreign body of scalp, initial encounter: Secondary | ICD-10-CM | POA: Insufficient documentation

## 2021-03-15 DIAGNOSIS — Z7902 Long term (current) use of antithrombotics/antiplatelets: Secondary | ICD-10-CM | POA: Diagnosis not present

## 2021-03-15 DIAGNOSIS — S60512A Abrasion of left hand, initial encounter: Secondary | ICD-10-CM | POA: Diagnosis not present

## 2021-03-15 DIAGNOSIS — S80811A Abrasion, right lower leg, initial encounter: Secondary | ICD-10-CM | POA: Diagnosis not present

## 2021-03-15 DIAGNOSIS — M79642 Pain in left hand: Secondary | ICD-10-CM | POA: Diagnosis not present

## 2021-03-15 DIAGNOSIS — W19XXXA Unspecified fall, initial encounter: Secondary | ICD-10-CM

## 2021-03-15 DIAGNOSIS — S199XXA Unspecified injury of neck, initial encounter: Secondary | ICD-10-CM | POA: Diagnosis not present

## 2021-03-15 DIAGNOSIS — W01198A Fall on same level from slipping, tripping and stumbling with subsequent striking against other object, initial encounter: Secondary | ICD-10-CM | POA: Diagnosis not present

## 2021-03-15 DIAGNOSIS — S8991XA Unspecified injury of right lower leg, initial encounter: Secondary | ICD-10-CM | POA: Diagnosis not present

## 2021-03-15 MED ORDER — LIDOCAINE-EPINEPHRINE-TETRACAINE (LET) TOPICAL GEL
3.0000 mL | Freq: Once | TOPICAL | Status: AC
  Administered 2021-03-15: 3 mL via TOPICAL
  Filled 2021-03-15: qty 3

## 2021-03-15 NOTE — ED Provider Notes (Signed)
[facility] EMERGENCY DEPT Provider Note   CSN: [id number] Arrival date & time: 03/15/21  1758     History Chief Complaint  Patient presents with   [name] is a 71 y.o. female presents to the ED after a mechanical fall pta. The patient reports that she tripped over a bag and landed on concrete hitting her head on a concrete planter. Denies any LOC. The patient is not on anti-coagulation therapy. She reports an abrasion and pain  to her left hand and to her right lower leg. She denies any lightheadedness, dizziness, blurry vision, chest pain, or shortness of breath.  Medical history includes hypertension. Surgical history includes cervical fusion. Allergic to statins. Denies any tobacco, EtOH, or drug use. Former smoker.   Fall Associated symptoms include headaches. Pertinent negatives include no chest pain, no abdominal pain and no shortness of breath.      Home Medications Prior to Admission medications   Medication Sig Start Date End Date Taking? Authorizing Provider  albuterol (VENTOLIN HFA) 108 (90 Base) MCG/ACT inhaler Inhale 2 puffs into the lungs every 6 (six) hours as needed for wheezing or shortness of breath. 01/24/20   Hunsucker, Bonna Gains, MD  cetirizine (ZYRTEC) 10 MG tablet Take 10 mg by mouth daily with breakfast.    [provider]  clopidogrel (PLAVIX) 75 MG tablet Take 75 mg by mouth daily.    [provider]  doxepin (SINEQUAN) 100 MG capsule Take 2 capsules (200 mg total) by mouth at bedtime. 10/10/20   Lomax, Amy, NP  ezetimibe (ZETIA) 10 MG tablet Take 10 mg by mouth daily.    [provider]  fluticasone (FLONASE) 50 MCG/ACT nasal spray Place 1 spray into both nostrils daily. Patient taking differently: Place 1 spray into both nostrils daily as needed for allergies. 05/28/19   Love, Ivan Anchors, PA-C  furosemide (LASIX) 40 MG tablet Take 40 mg by mouth daily. 06/23/19   [provider]  gabapentin  (NEURONTIN) 600 MG tablet Take 1 tablet (600 mg total) by mouth 2 (two) times daily. 11/20/20   Kirsteins, Luanna Salk, MD  methylPREDNISolone (MEDROL DOSEPAK) 4 MG TBPK tablet Take as directed 02/26/21   Lomax, Amy, NP  ondansetron (ZOFRAN) 4 MG tablet Take 1 tablet (4 mg total) by mouth every 8 (eight) hours as needed for nausea or vomiting. 05/24/20   Melina Schools, MD  pantoprazole (PROTONIX) 40 MG tablet TAKE 1 TABLET(40 MG) BY MOUTH TWICE DAILY 02/11/21   Gerrit Heck, MD  Rimegepant Sulfate (NURTEC) 75 MG TBDP Take 75 mg by mouth daily as needed (take for abortive therapy of migraine, no more than 1 tablet in 24 hours or 10 per month). 02/26/21   Lomax, Amy, NP  sertraline (ZOLOFT) 25 MG tablet TAKE 1 TABLET(25 MG) BY MOUTH DAILY. 11/06/20   Precious Gilding, DO  sertraline (ZOLOFT) 50 MG tablet TAKE 1 TABLET(50 MG) BY MOUTH DAILY 12/10/20   Precious Gilding, DO  tiZANidine (ZANAFLEX) 4 MG tablet Take 1 tablet (4 mg total) by mouth every 6 (six) hours as needed for muscle spasms. 02/26/21   Lomax, Amy, NP  traMADol (ULTRAM) 50 MG tablet Take 1 tablet (50 mg total) by mouth every 8 (eight) hours as needed for moderate pain. 12/07/20   Charlett Blake, MD  traZODone (DESYREL) 50 MG tablet TAKE 1/2 TO 1 TABLET(25 TO 50 MG) BY MOUTH AT BEDTIME AS NEEDED FOR SLEEP 03/05/21   Precious Gilding, DO  zonisamide (  ZONEGRAN) 100 MG capsule Take by mouth.    [provider]  zonisamide (ZONEGRAN) 25 MG capsule take 25 mg twice daily prn in addition to 100 mg twice daily. 10/10/20   Lomax, Amy, NP      Allergies    Shellfish allergy, Iodine, and Statins    Review of Systems   Review of Systems  Constitutional:  Negative for chills and fever.  HENT:  Negative for ear pain and sore throat.   Eyes:  Negative for pain and visual disturbance.  Respiratory:  Negative for cough and shortness of breath.   Cardiovascular:  Negative for chest pain and palpitations.  Gastrointestinal:  Negative for abdominal pain  and vomiting.  Genitourinary:  Negative for dysuria and hematuria.  Musculoskeletal:  Positive for arthralgias and myalgias. Negative for back pain, neck pain and neck stiffness.  Skin:  Positive for wound. Negative for color change and rash.  Neurological:  Positive for headaches. Negative for dizziness, seizures, syncope, weakness and light-headedness.  All other systems reviewed and are negative.  Physical Exam Updated Vital Signs BP (!) 172/92 (BP Location: Right Arm)    Pulse 86    Temp 98.3 F (36.8 C)    Resp 12    Ht 5\' 6"  (1.676 m)    Wt 79.4 kg    SpO2 97%    BMI 28.25 kg/m  Physical Exam Vitals and nursing note reviewed.  Constitutional:      General: She is not in acute distress.    Appearance: Normal appearance. She is not toxic-appearing.  HENT:     Head: Normocephalic.     Comments: No stepoffs or deformities noted on the scalp or face.    Right Ear: Tympanic membrane, ear canal and external ear normal.     Left Ear: Tympanic membrane, ear canal and external ear normal.     Ears:     Comments: No hemotympanum     Mouth/Throat:     Mouth: Mucous membranes are moist.  Eyes:     General: No scleral icterus.    Extraocular Movements: Extraocular movements intact.     Pupils: Pupils are equal, round, and reactive to light.  Neck:     Comments: No cervical or midline tenderness. No overlying skin changes noted. No deformities or step offs visualized or palpated.  Cardiovascular:     Rate and Rhythm: Normal rate and regular rhythm.  Pulmonary:     Effort: Pulmonary effort is normal. No respiratory distress.     Breath sounds: Normal breath sounds.     Comments: CTAB Abdominal:     General: Abdomen is flat. Bowel sounds are normal.     Palpations: Abdomen is soft.     Tenderness: There is no abdominal tenderness. There is no guarding or rebound.  Musculoskeletal:        General: Tenderness and signs of injury present. No swelling or deformity.     Cervical back:  Normal range of motion. No rigidity or tenderness.     Comments: Back- No midline or paraspinal tenderness for the cervical, thoracic, lumbar, or sacrum. No obvious step offs or deformities noted or visualized. No overlying skin changes noted.   RUE- No obvious step offs or deformities noted or visualized. No overlying skin changes noted. No snuffbox tenderness. FROM. Compartments soft.   LUE- No obvious step offs or deformities noted or visualized. Superficial abrasion to the dorsum of the left. No snuffbox tenderness. FROM. Compartments soft.   LLE- No  obvious step offs or deformities noted or visualized. No overlying skin changes noted. FROM. Compartments soft. See picture.  RLE- No obvious step offs or deformities noted or visualized. Superficial abrasion to the lateral right lower leg. FROM. Compartments soft. See picture.  Skin:    General: Skin is warm and dry.     Comments: Laceration around 5cm to the superficial area of the scalp.  See picture.  Neurological:     General: No focal deficit present.     Mental Status: She is alert and oriented to person, place, and time. Mental status is at baseline.     Cranial Nerves: No cranial nerve deficit.     Sensory: No sensory deficit.     Motor: No weakness.     Comments: No drift.  Cranial nerves II through XII intact.  Strength out of 5 in upper and lower extremities bilaterally.  Sensation intact.       ED Results / Procedures / Treatments   Labs (all labs ordered are listed, but only abnormal results are displayed) Labs Reviewed - No data to display  EKG None  Radiology DG Tibia/Fibula Right  Result Date: 03/15/2021 CLINICAL DATA:  injury EXAM: RIGHT TIBIA AND FIBULA - 2 VIEW COMPARISON:  None. FINDINGS: There is no evidence of fracture or other focal bone lesions. Soft tissues are unremarkable. Limited evaluation of the knee and ankle grossly unremarkable. Vascular calcifications. IMPRESSION: No acute displaced fracture or  dislocation. Electronically Signed   By: Iven Finn M.D.   On: 03/15/2021 19:28   CT Head Wo Contrast  Result Date: 03/15/2021 CLINICAL DATA:  Head trauma. EXAM: CT HEAD WITHOUT CONTRAST TECHNIQUE: Contiguous axial images were obtained from the base of the skull through the vertex without intravenous contrast. COMPARISON:  Head CT dated 05/14/2019. FINDINGS: Brain: The ventricles and sulci are appropriate size for the patient's age. The gray-white matter discrimination is preserved. There is no acute intracranial hemorrhage. No mass effect or midline shift. No extra-axial fluid collection. Vascular: No hyperdense vessel or unexpected calcification. Skull: Normal. Negative for fracture or focal lesion. Sinuses/Orbits: No acute finding. Other: Laceration of the scalp over the right vertex. No large hematoma. IMPRESSION: Unremarkable noncontrast CT of the brain. Electronically Signed   By: Anner Crete M.D.   On: 03/15/2021 19:34   CT Cervical Spine Wo Contrast  Result Date: 03/15/2021 CLINICAL DATA:  Neck trauma.  Status post fall. EXAM: CT CERVICAL SPINE WITHOUT CONTRAST TECHNIQUE: Multidetector CT imaging of the cervical spine was performed without intravenous contrast. Multiplanar CT image reconstructions were also generated. COMPARISON:  07/18/2019 FINDINGS: Alignment: Normal. Skull base and vertebrae: No acute fracture. No primary bone lesion or focal pathologic process. Soft tissues and spinal canal: No prevertebral fluid or swelling. No visible canal hematoma. Disc levels: Status post fusion of C3 through C6 with C5 corpectomy and interbody spacer identified at C6-7. Upper chest: Negative. Other: None IMPRESSION: 1. No evidence for cervical spine fracture. 2. Status post fusion of C3 through C6 with C5 corpectomy and interbody spacer at C6-7. Electronically Signed   By: Kerby Moors M.D.   On: 03/15/2021 20:11   DG Hand Complete Left  Result Date: 03/15/2021 CLINICAL DATA:  Abrasion to left  hand at the second mcp joint EXAM: LEFT HAND - COMPLETE 3+ VIEW COMPARISON:  None. FINDINGS: Osteopenia. Degenerative changes are noted at the basilar joint an D IP joints. No dislocation or fracture. Soft tissues are unremarkable. IMPRESSION: 1. No acute findings. 2. Osteopenia.  3. Osteoarthritis. Electronically Signed   By: Kerby Moors M.D.   On: 03/15/2021 19:29    Procedures .Marland KitchenLaceration Repair  Date/Time: 03/17/2021 11:02 AM Performed by: Sherrell Puller, PA-C Authorized by: Sherrell Puller, PA-C   Consent:    Consent obtained:  Verbal   Consent given by:  Patient   Risks, benefits, and alternatives were discussed: yes     Risks discussed:  Infection and pain   Alternatives discussed:  No treatment Universal protocol:    Procedure explained and questions answered to patient or proxy's satisfaction: yes     Relevant documents present and verified: no     Test results available: no     Imaging studies available: yes     Required blood products, implants, devices, and special equipment available: no     Site/side marked: no     Immediately prior to procedure, a time out was called: no     Patient identity confirmed:  Verbally with patient Anesthesia:    Anesthesia method:  Topical application   Topical anesthetic:  LET Laceration details:    Location:  Scalp   Scalp location:  Crown   Length (cm):  5   Depth (mm):  2 Pre-procedure details:    Preparation:  Patient was prepped and draped in usual sterile fashion and imaging obtained to evaluate for foreign bodies Exploration:    Hemostasis achieved with:  LET and direct pressure   Wound exploration: wound explored through full range of motion and entire depth of wound visualized     Contaminated: no   Treatment:    Area cleansed with:  Saline and povidone-iodine   Amount of cleaning:  Standard   Irrigation solution:  Sterile saline   Irrigation volume:  1030ml   Irrigation method:  Syringe, pressure wash and tap Skin repair:     Repair method:  Staples   Number of staples:  5 Approximation:    Approximation:  Close Repair type:    Repair type:  Simple Post-procedure details:    Dressing:  Open (no dressing)   Procedure completion:  Tolerated well, no immediate complications    Medications Ordered in ED Medications  lidocaine-EPINEPHrine-tetracaine (LET) topical gel (3 mLs Topical Given 03/15/21 2027)    ED Course/ Medical Decision Making/ A&P                           Medical Decision Making  71 year old female presents emergency department for evaluation after mechanical fall today.  Differential diagnosis includes but not limited to spinal fracture, wrist fracture, leg fracture, brain bleed. Vital signs show some hypertension, know HTN, afebrile, normal heart rate, satting well on RA. Physical exam shows laceration to the scalp, no step offs or deformities noted.  Abrasion to the lateral aspect of the right lower leg.  Abrasion to the dorsum of the left hand.  No snuffbox tenderness bilaterally.  Compartments are soft.  Patient ambulatory.  Laceration on the scalp will need and primary closure.  Cranial nerves II through XII intact.  No pronator drift.  Strength 5-5 in upper and lower extremities bilaterally.  CT/XR imaging in triage.    X-ray of left hand shows osteopenia and osteoarthritis however no acute findings.  Right tib-fib x-ray shows no fractures or dislocations.  CT head and C-spine shows status post fusion with no evidence of cervical spinal fracture.  CT head is unremarkable for any intracranial process however does see a laceration to the  right scalp.  Procedure completed for the patient's scalp laceration 5 staples were placed.  See procedure note for more information.  See patient's reassuring imaging, vital signs, physical exam, the patient does not meet criteria for admission and is safe for discharge.  I discussed the patient's imaging results with her.  Recommended Tylenol as needed for  pain.  Recommended follow-up with her PCP within the week for reevaluation.  Strict return precautions discussed.  Patient agrees with plan.  Patient is stable being discharged home in good condition.   Final Clinical Impression(s) / ED Diagnoses Final diagnoses:  Fall, initial encounter  Scalp laceration, initial encounter  Abrasion of right lower extremity, initial encounter  Left hand pain    Rx / DC Orders ED Discharge Orders     None         Sherrell Puller, Hershal Coria 03/17/21 1105    Dorie Rank, MD 03/17/21 1907

## 2021-03-15 NOTE — ED Notes (Signed)
Patient transported to CT at this Time. ?

## 2021-03-15 NOTE — ED Notes (Signed)
Head Laceration irrigated with 250-500 mL of Sterile Water and Wound Cleanser. LET and Gauze applied. Leg Abrasion irrigated and cleansed. Bandage applied to Leg Wound. Patient tolerated procedure well.

## 2021-03-15 NOTE — Discharge Instructions (Signed)
You are seen here today for evaluation of your fall today.  Your imaging was negative, there is no signs of fracture or break.  You sustain a head laceration that was repaired with staples.  You need to return to the emergency department or your primary care office for removal in 7 days.  Please keep the area clean and dry with soap and water.  You can apply topical barrier such as Aquaphor or Vaseline to the area.  Please do not submerge the area in water or directly put in under the shower.  If it has any purulent discharge, continuous bleeding, or if any staples come out, please return to the nearest emergency department for evaluation.  If you have any worsening headache, neck pain, lightheadedness, or dizziness, please return the nearest emergency department for evaluation.

## 2021-03-15 NOTE — ED Triage Notes (Addendum)
Pt presents to ED with an abrasion to her Right calf and head pain d/t a mechanical fall this approx 1 hour ago. Pt has an abrasion to her Right calf and Left hand, lac to top of head. Pt denies LOC, N/V   Pt takes Plavix

## 2021-03-15 NOTE — ED Notes (Signed)
RN provided AVS using Teachback Method. Patient verbalizes understanding of Discharge Instructions. Opportunity for Questioning and Answers were provided by RN. Patient Discharged from ED in wheelchair to Home with Family.

## 2021-03-15 NOTE — ED Notes (Signed)
Radiology at Bedside.

## 2021-03-18 DIAGNOSIS — M542 Cervicalgia: Secondary | ICD-10-CM | POA: Diagnosis not present

## 2021-03-26 DIAGNOSIS — M25511 Pain in right shoulder: Secondary | ICD-10-CM | POA: Diagnosis not present

## 2021-03-26 DIAGNOSIS — S42201S Unspecified fracture of upper end of right humerus, sequela: Secondary | ICD-10-CM | POA: Diagnosis not present

## 2021-04-01 ENCOUNTER — Telehealth: Payer: Self-pay

## 2021-04-02 NOTE — Telephone Encounter (Signed)
Patient called to request a refill on Gabapentin 300 mg. In the chart, it says she is on 600 mg. Left message for patient to return call to confirm which dosage she is on.

## 2021-04-02 NOTE — Telephone Encounter (Signed)
I spoke with [name] and she says she is taking one 600 mg in the morning and (2) 300 mg at bedtime.  I told her that Dr Letta Pate last order was 600 mg bid so why doesn't she just take the 600 mg bid instead. She agrees. There should be refills through November.

## 2021-04-12 ENCOUNTER — Encounter: Payer: Medicare Other | Admitting: Physical Medicine & Rehabilitation

## 2021-04-19 ENCOUNTER — Other Ambulatory Visit: Payer: Self-pay

## 2021-04-19 ENCOUNTER — Encounter: Payer: Self-pay | Admitting: Physical Medicine & Rehabilitation

## 2021-04-19 ENCOUNTER — Encounter: Payer: Medicare Other | Attending: Physical Medicine & Rehabilitation | Admitting: Physical Medicine & Rehabilitation

## 2021-04-19 VITALS — BP 120/75 | HR 70 | Temp 98.4°F | Ht 66.0 in | Wt 192.0 lb

## 2021-04-19 DIAGNOSIS — M533 Sacrococcygeal disorders, not elsewhere classified: Secondary | ICD-10-CM | POA: Insufficient documentation

## 2021-04-19 DIAGNOSIS — G8929 Other chronic pain: Secondary | ICD-10-CM | POA: Insufficient documentation

## 2021-04-19 NOTE — Progress Notes (Signed)
Subjective:    Patient ID: [name], female    DOB: Apr 19, 1950, 71 y.o.   MRN: [id number]  HPI  Right side back and buttock pain persist, had good response to Sacroiliac  nerve blocks , > 50% relief on 2 occasions, 09/18/2021 8/10-->2/10 And 02/16/2020,8/10 --> 4/10 Still doing exercise and stretching as well as meds for low back but pain is persisting   01/11/21 Sacroiliac radio frequency ablation under fluoroscopic guidance This consists of LEFT L5 dorsal ramus radio frequency ablation plus RF of  LEFT S1-S2 -S3 lateral branches  No new symptoms such as numbness or tingling or weakness in the lower extremities. Pain Inventory Average Pain 7 Pain Right Now 7 My pain is constant, dull, and stabbing  In the last 24 hours, has pain interfered with the following? General activity 9 Relation with others 9 Enjoyment of life 10 What TIME of day is your pain at its worst? morning , daytime, and evening Sleep (in general) Poor  Pain is worse with: walking, bending, and standing Pain improves with: rest and heat/ice Relief from Meds: 7       Family History  Problem Relation Age of Onset   Cancer Mother 21       breast   Alzheimer's disease Mother    Prostate cancer Father    Cancer Brother    Hyperlipidemia Brother    Lung cancer Father    Dementia Maternal Grandmother    Cancer Maternal Grandfather    Cancer Brother    Hyperlipidemia Brother    Dementia Brother        frontotemporal dementia   Social History   Socioeconomic History   Marital status: Married    Spouse name: [name]   Number of children: 1   Years of education: 12+   Highest education level: High school graduate  Occupational History   Occupation: ACTIVITY DIRECTOR    Employer: [employer]   Occupation: CNA  Tobacco Use   Smoking status: Former    Packs/day: 0.30    Types: Cigarettes    Quit date: 11/01/2011    Years since quitting: 9.4   Smokeless tobacco: Never  Vaping Use    Vaping Use: Never used  Substance and Sexual Activity   Alcohol use: No    Alcohol/week: 0.0 standard drinks   Drug use: No   Sexual activity: Yes    Partners: Male    Birth control/protection: Surgical    Comment: 1 sexual partner in last 12 months: married  Other Topics Concern   Not on file  Social History Narrative   ** Merged History Encounter **       Patient is married ([name]). Patient has one child and grandchild. Her sons name is Shanon Brow. Patient sees them often. 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 Patient spends 6-10 hours with her 6 days a week.     Patient has a college education. Patient is very active in her church.  Hobbies- outside crafts and church choir.   Social Determinants of Health   Financial Resource Strain: Not on file  Food Insecurity: Not on file  Transportation Needs: Not on file  Physical Activity: Not on file  Stress: Not on file  Social Connections: Not on file   Past Surgical History:  Procedure Laterality Date   ABDOMINAL HYSTERECTOMY     ANTERIOR CERVICAL DECOMP/DISCECTOMY FUSION N/A 05/24/2020   Procedure: ANTERIOR CERVICAL DECOMPRESSION/DISCECTOMY FUSION  1 LEVEL C3-4;  Surgeon: Melina Schools, MD;  Location: [facility];  Service: Orthopedics;  Laterality: N/A;  3 hrs   BILIARY STENT PLACEMENT N/A 12/12/2015   Procedure: BILIARY STENT PLACEMENT;  Surgeon: Doran Stabler, MD;  Location: [place];  Service: Endoscopy;  Laterality: N/A;   BLADDER REPAIR     CARPAL TUNNEL RELEASE     CERVICAL FUSION     CERVICAL SPINE SURGERY     CHOLECYSTECTOMY N/A 12/06/2015   Procedure: LAPAROSCOPIC CHOLECYSTECTOMY WITH  INTRAOPERATIVE CHOLANGIOGRAM;  Surgeon: Stark Klein, MD;  Location: [place] [facility];  Service: General;  Laterality: N/A;   ELBOW SURGERY     ERCP N/A 12/12/2015   Procedure: ENDOSCOPIC RETROGRADE CHOLANGIOPANCREATOGRAPHY (ERCP);  Surgeon: Doran Stabler, MD;  Location: [facility] ENDOSCOPY;  Service:  Endoscopy;  Laterality: N/A;   ESOPHAGOGASTRODUODENOSCOPY N/A 01/28/2016   Procedure: ESOPHAGOGASTRODUODENOSCOPY (EGD) Biliary STENT removal;  Surgeon: Doran Stabler, MD;  Location: [facility] ENDOSCOPY;  Service: Gastroenterology;  Laterality: N/A;   IR GENERIC HISTORICAL  12/17/2015   IR US GUIDE BX ASP/DRAIN 12/17/2015 [facility]   IR GENERIC HISTORICAL  12/17/2015   IR GUIDED DRAIN W CATHETER PLACEMENT 12/17/2015 [facility]   IR GENERIC HISTORICAL  12/17/2015   IR SINUS/FIST TUBE CHK-NON GI 12/17/2015 [facility]   KNEE SURGERY     LUNG SURGERY     TEE WITHOUT CARDIOVERSION N/A 12/19/2015   Procedure: TRANSESOPHAGEAL ECHOCARDIOGRAM (TEE);  Surgeon: Josue Hector, MD;  Location: [facility] ENDOSCOPY;  Service: Cardiovascular;  Laterality: N/A;   Past Medical History:  Diagnosis Date   Anemia    Cervical neuritis    Chronic kidney disease    stage 3   Dyslipidemia    GERD (gastroesophageal reflux disease)    History of transient ischemic attack (TIA)    HTN (hypertension)    Hyperlipidemia    Migraine    Migraine headache    Occipital neuralgia    Left   Pulmonary embolism ([place]) 2017   Renal insufficiency    Stroke ([place]) 10/2013   There were no vitals taken for this visit.  Opioid Risk Score:   Fall Risk Score:  `1  Depression screen PHQ 2/9  Depression screen [facility] 2/9 01/11/2021 08/09/2020 07/24/2020 07/24/2020 05/22/2020 05/08/2020 01/24/2020  Decreased Interest 1 1 1 1 1 1 1   Down, Depressed, Hopeless 1 1 0 0 1 1 1   PHQ - 2 Score 2 2 1 1 2 2 2   Altered sleeping - - 0 0 1 - -  Tired, decreased energy - - 1 1 1  - -  Change in appetite - - 0 0 1 - -  Feeling bad or failure about yourself  - - 0 0 2 - -  Trouble concentrating - - 0 0 0 - -  Moving slowly or fidgety/restless - - 0 0 2 - -  Suicidal thoughts - - 0 0 0 - -  PHQ-9 Score - - 2 2 9  - -  Difficult doing work/chores - - Not difficult at all - - - -  Some recent data might be hidden    Review of Systems   Musculoskeletal:  Positive for gait problem.       Right hip & right leg pain  All other systems reviewed and are negative.     Objective:   Physical Exam Vitals and nursing note reviewed.  Constitutional:      Appearance: She is obese.  HENT:  Head: Normocephalic and atraumatic.  Eyes:     Extraocular Movements: Extraocular movements intact.     Conjunctiva/sclera: Conjunctivae normal.     Pupils: Pupils are equal, round, and reactive to light.  Musculoskeletal:     Comments:  Sacral thrust (prone) : Positive on right Lateral compression: Negative FABER's: Positive on right Distraction (supine): Positive on right Thigh thrust test: Positive on right  Skin:    General: Skin is warm and dry.  Neurological:     General: No focal deficit present.     Mental Status: She is alert and oriented to person, place, and time.  Psychiatric:        Mood and Affect: Mood normal.        Behavior: Behavior normal.          Assessment & Plan:  #1.  Right sacroiliac pain she has had greater than 50% relief on 2 occasions with right sacroiliac nerve blocks under fluoroscopic, good success with left sacroiliac pain after radiofrequency neurotomy, will schedule for right sacroiliac RF given the patient has persistent moderate to severe pain despite medication management as well as ongoing therapeutic exercise program

## 2021-04-25 ENCOUNTER — Other Ambulatory Visit: Payer: Self-pay

## 2021-04-25 MED ORDER — SERTRALINE HCL 50 MG PO TABS: ORAL_TABLET | ORAL | 1 refills | Status: DC

## 2021-05-03 ENCOUNTER — Other Ambulatory Visit: Payer: Self-pay | Admitting: Student

## 2021-05-08 ENCOUNTER — Other Ambulatory Visit: Payer: Self-pay | Admitting: Physical Medicine & Rehabilitation

## 2021-05-08 NOTE — Progress Notes (Addendum)
? ? ?  SUBJECTIVE:  ? ?CHIEF COMPLAINT / HPI: Review medications and meet new PCP ? ?Reviewed medications ?Patient is currently taking all medications listed appropriately with the exception of Plavix and Zoloft.  She states she has not been taking Plavix for a while due to the fact that it stopped being prescribed but she is unsure why.  She denies any side effects to her medications. She is prescribed Zoloft 50 mg daily and Zoloft 25 mg daily.  Patient states she was prescribed the separately and advised to take the Zoloft 50 mg every day and then added the 25 mg of Zoloft as needed if her anxiety was worse.  She states she only takes it at 25 mg of Zoloft at bedtime when she feels the need to do so, but not every night.  She states when she takes the extra 25 mg at nighttime it does not help. ? ?Health maintenance ?Patient would like to be updated with  Prevnar 20 today, request Shingrix prescription be sent to her pharmacy, plans to get COVID booster at her pharmacy, and request Cologuard be ordered as she does not wish to have a colonoscopy at this time. ? ?HLD ?Lipid panel on 06/13/2019 showed total cholesterol of 210, triglycerides of 224, LDL of 123.  Patient states she cannot tolerate a statin medication but is doing well with Zetia 10 mg daily. ? ?PERTINENT  PMH / PSH: Hypertension, PVD, GERD, HLD, history of CVA, anxiety, insomnia, osteoarthritis, migraines, chronic back pain ? ?OBJECTIVE:  ? ?Vitals:  ? 05/10/21 1627 05/10/21 1710  ?BP: (!) 142/109 133/84  ?Pulse: (!) 121   ?SpO2: 96%   ? ? ?General: NAD, pleasant, able to participate in exam ?Cardiac: RRR, no murmurs. ?Respiratory: CTAB, normal effort, No wheezes, rales or rhonchi ?Abdomen: Bowel sounds present, nontender, non distended, soft ?Extremities: no edema of BLEs ?Skin: warm and dry ?Neuro: alert, no obvious focal deficits ?Psych: Normal affect and mood ? ?ASSESSMENT/PLAN:  ? ?Medications reviewed ?Although she is on medications which can  interact (trazodone, zoloft, tramadol), she is not having any side effects, is able to work a CMA, and we will continue current regimen.  ? ?Patient is not sure why her Plavix is no longer being prescribed.  Patient does have history of PVD which I suspect is why the Plavix was prescribed initially. I advised her to make an appointment with her cardiologist in the near future as she is due for an appointment at which time she can ask  about the Plavix. ? ?Advised patient to continue taking her Zoloft 50 mg daily but to discontinue taking the 25 mg Zoloft at bedtime as needed as she states it does not help.  Advised patient to reach out to me if her anxiety worsens. ? ?Health maintenance ?-Cologuard ordered ?-Shingrix ordered ?-Prevnar 20 given ? ?HLD ?-Lipid panel ordered ?-Continue Zetia 10 mg daily ?  ? ? ?Dr. Precious Gilding, DO ?[place]  ? ? ? ? ?

## 2021-05-09 ENCOUNTER — Telehealth: Payer: Self-pay | Admitting: Family Medicine

## 2021-05-09 MED ORDER — ZONISAMIDE 100 MG PO CAPS: 100.0000 mg | ORAL_CAPSULE | Freq: Every day | ORAL | 0 refills | Status: DC

## 2021-05-09 NOTE — Telephone Encounter (Signed)
Pt called and dosage confirmed, pt is taking 100mg  of zonisamide a day and an extra 25mg  at night if needed.  ? ?Refil sent for 90 day supply.  ?

## 2021-05-09 NOTE — Telephone Encounter (Signed)
Pt request refill for zonisamide (ZONEGRAN) [id number]  ?

## 2021-05-10 ENCOUNTER — Encounter: Payer: Self-pay | Admitting: Student

## 2021-05-10 ENCOUNTER — Ambulatory Visit (INDEPENDENT_AMBULATORY_CARE_PROVIDER_SITE_OTHER): Payer: Medicare Other | Admitting: Student

## 2021-05-10 ENCOUNTER — Other Ambulatory Visit: Payer: Self-pay

## 2021-05-10 VITALS — BP 133/84 | HR 121 | Ht 66.0 in | Wt 181.4 lb

## 2021-05-10 DIAGNOSIS — Z1211 Encounter for screening for malignant neoplasm of colon: Secondary | ICD-10-CM | POA: Diagnosis not present

## 2021-05-10 DIAGNOSIS — Z23 Encounter for immunization: Secondary | ICD-10-CM | POA: Diagnosis not present

## 2021-05-10 DIAGNOSIS — E785 Hyperlipidemia, unspecified: Secondary | ICD-10-CM | POA: Diagnosis not present

## 2021-05-10 MED ORDER — ZOSTER VAC RECOMB ADJUVANTED 50 MCG/0.5ML IM SUSR: 0.5000 mL | Freq: Once | INTRAMUSCULAR | 0 refills | Status: AC

## 2021-05-10 NOTE — Patient Instructions (Addendum)
It was great to see you! Thank you for allowing me to participate in your care! ? ?I recommend that you always bring your medications to each appointment as this makes it easy to ensure you are on the correct medications and helps [place] not miss when refills are needed. ? ?Our plans for today:  ?- Follow up in 6 months or sooner if needed ?--Follow up with your cardiologist ASAP and discuss Plavix (medication for vascular disease) ?- I sent a prescription for shingrix vaccine to your pharmacy ?-You received the pneumococcal vaccine today ?-Please return in the next couple weeks for a lab only visit to check your cholesterol ? ?Take care and seek immediate care sooner if you develop any concerns.  ? ?Dr. Precious Gilding, DO ?[facility] ? ?

## 2021-05-11 ENCOUNTER — Encounter: Payer: Self-pay | Admitting: Student

## 2021-05-11 NOTE — Addendum Note (Signed)
Addended by: Micheline Rough on: 05/11/2021 04:58 PM ? ? Modules accepted: Orders ? ?

## 2021-05-27 ENCOUNTER — Other Ambulatory Visit: Payer: Self-pay

## 2021-05-27 ENCOUNTER — Ambulatory Visit (INDEPENDENT_AMBULATORY_CARE_PROVIDER_SITE_OTHER): Payer: Medicare Other | Admitting: Neurology

## 2021-05-27 VITALS — BP 134/80 | HR 68 | Ht 66.0 in | Wt 187.6 lb

## 2021-05-27 DIAGNOSIS — R635 Abnormal weight gain: Secondary | ICD-10-CM

## 2021-05-27 DIAGNOSIS — E669 Obesity, unspecified: Secondary | ICD-10-CM

## 2021-05-27 DIAGNOSIS — R519 Headache, unspecified: Secondary | ICD-10-CM

## 2021-05-27 DIAGNOSIS — R351 Nocturia: Secondary | ICD-10-CM | POA: Diagnosis not present

## 2021-05-27 DIAGNOSIS — Z820 Family history of epilepsy and other diseases of the nervous system: Secondary | ICD-10-CM

## 2021-05-27 DIAGNOSIS — R0683 Snoring: Secondary | ICD-10-CM

## 2021-05-27 NOTE — Patient Instructions (Signed)
Thank you for choosing [facility] for your sleep related care! ?It was nice to meet you today!  ? ?Here is what we discussed today:  ?  ?Based on your symptoms and your exam I believe you are at risk for obstructive sleep apnea (aka OSA), and I think we should proceed with a sleep study to determine whether you do or do not have OSA and how severe it is. Even, if you have mild OSA, I may want you to consider treatment with CPAP, as treatment of even borderline or mild sleep apnea can result and improvement of symptoms such as sleep disruption, daytime sleepiness, nighttime bathroom breaks, restless leg symptoms, improvement of headache syndromes, even improved mood disorder.  ? ?As explained, an attended sleep study (meaning you get to stay overnight in the sleep lab), lets us monitor sleep-related behaviors such as sleep talking and leg movements in sleep, in addition to monitoring for sleep apnea.  A home sleep test is a screening tool for sleep apnea diagnosis only, but unfortunately, does not help with any other sleep-related diagnoses. ? ?Please remember, the long-term risks and ramifications of untreated moderate to severe obstructive sleep apnea may include (but are not limited to): increased risk for cardiovascular disease, including congestive heart failure, stroke, difficult to control hypertension, treatment resistant obesity, arrhythmias, especially irregular heartbeat commonly known as A. Fib. (atrial fibrillation); even type 2 diabetes has been linked to untreated OSA.  ?Other correlations that untreated obstructive sleep apnea include macular edema which is swelling of the retina in the eyes, droopy eyelid syndrome, and elevated hemoglobin and hematocrit levels (often referred to as polycythemia). ? ?Sleep apnea can cause disruption of sleep and sleep deprivation in most cases, which, in turn, can cause recurrent headaches, problems with memory, mood, concentration, focus, and  vigilance. Most people with untreated sleep apnea report excessive daytime sleepiness, which can affect their ability to drive. Please do not drive if you feel sleepy. Patients with sleep apnea can also develop difficulty initiating and maintaining sleep (aka insomnia).  ? ?Having sleep apnea may increase your risk for other sleep disorders, including involuntary behaviors sleep such as sleep terrors, sleep talking, sleepwalking.   ? ?Having sleep apnea can also increase your risk for restless leg syndrome and leg movements at night.  ? ?Please note that untreated obstructive sleep apnea may carry additional perioperative morbidity. Patients with significant obstructive sleep apnea (typically, in the moderate to severe degree) should receive, if possible, perioperative PAP (positive airway pressure) therapy and the surgeons and particularly the anesthesiologists should be informed of the diagnosis and the severity of the sleep disordered breathing.  ? ?I will likely see you back after your sleep study to go over the test results and where to go from there. We will call you after your sleep study to advise about the results (most likely, you will hear from Megan, my nurse) and to set up an appointment at the time, as necessary.   ? ?Our sleep lab administrative assistant will call you to schedule your sleep study and give you further instructions, regarding the check in process for the sleep study, arrival time, what to bring, when you can expect to leave after the study, etc., and to answer any other logistical questions you may have. If you don't hear back from her by about 2 weeks from now, please feel free to call her direct line at [phone] or you can call our general clinic number, or email us through   My Chart.  ? ?

## 2021-05-27 NOTE — Progress Notes (Signed)
Subjective:  ?  ?Patient ID: [name] is a 71 y.o. female. ? ?HPI ? ? ? ?Courtney Age, MD, PhD ?[name] ?[name] ?[phone] ?[place],  [address] ? ?Dear [name],  ? ?I saw your patient, [name], upon your kind request, in my Sleep clinic today for initial consultation of her sleep disorder, in particular, concern for underlying obstructive sleep apnea.  The patient is unaccompanied today.  As you know, [name] is a 71 year old right-handed woman with an underlying medical history of anemia, chronic kidney disease, hyperlipidemia, reflux disease, hypertension, migraine headaches, neck pain, history of pulmonary embolism, history of stroke, and obesity, who reports some snoring and excessive daytime somnolence as well as morning headaches and a family history of sleep apnea in her brother. She reports that she tried her brother's CPAP mask on and felt claustrophobic with it.  I reviewed your office note from 02/26/2021.  She was started on Nurtec at the time and the addition of Emgality was discussed.  Her Epworth sleepiness score is 9 out of 24, fatigue severity score is 50 out of 63. ?Of note, he is on multiple potentially sedating medications including doxepin for sleep, 200 mg at bedtime as well trazodone, 25 to 50 mg at night.  In addition, she takes zonisamide, Zanaflex, gabapentin, sertraline.  Bedtime is 8 PM and rise time around 5 AM.  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  She reports significant nocturia about 4 times per average night.  She has occasionally woken up with a headache.  She drinks caffeine in the form of coffee, about 4 cups/day.  She quit smoking some 12 years ago and does not currently utilize any alcohol.  She lives with her husband.  Her grandson who is 64 years old has complained about her snoring.  She reports weight gain in the realm of 30 pounds in the past 2-1/2 years. ? ?Her Past Medical  History Is Significant For: ?Past Medical History:  ?Diagnosis Date  ? Anemia   ? Cervical neuritis   ? Chronic kidney disease   ? stage 3  ? Dyslipidemia   ? GERD (gastroesophageal reflux disease)   ? History of transient ischemic attack (TIA)   ? HTN (hypertension)   ? Hyperlipidemia   ? Migraine   ? Migraine headache   ? Occipital neuralgia   ? Left  ? Pulmonary embolism ([place]) 2017  ? Renal insufficiency   ? Stroke [facility]) 10/2013  ? ? ?Her Past Surgical History Is Significant For: ?Past Surgical History:  ?Procedure Laterality Date  ? ABDOMINAL HYSTERECTOMY    ? ANTERIOR CERVICAL DECOMP/DISCECTOMY FUSION N/A 05/24/2020  ? Procedure: ANTERIOR CERVICAL DECOMPRESSION/DISCECTOMY FUSION 1 LEVEL C3-4;  Surgeon: Melina Schools, MD;  Location: [place];  Service: Orthopedics;  Laterality: N/A;  3 hrs  ? BILIARY STENT PLACEMENT N/A 12/12/2015  ? Procedure: BILIARY STENT PLACEMENT;  Surgeon: Doran Stabler, MD;  Location: [facility];  Service: Endoscopy;  Laterality: N/A;  ? BLADDER REPAIR    ? CARPAL TUNNEL RELEASE    ? CERVICAL FUSION    ? CERVICAL SPINE SURGERY    ? CHOLECYSTECTOMY N/A 12/06/2015  ? Procedure: LAPAROSCOPIC CHOLECYSTECTOMY WITH  INTRAOPERATIVE CHOLANGIOGRAM;  Surgeon: Stark Klein, MD;  Location: [place];  Service: General;  Laterality: N/A;  ? ELBOW SURGERY    ? ERCP N/A 12/12/2015  ? Procedure: ENDOSCOPIC RETROGRADE CHOLANGIOPANCREATOGRAPHY (  ERCP);  Surgeon: Doran Stabler, MD;  Location: [facility];  Service: Endoscopy;  Laterality: N/A;  ? ESOPHAGOGASTRODUODENOSCOPY N/A 01/28/2016  ? Procedure: ESOPHAGOGASTRODUODENOSCOPY (EGD) Biliary STENT removal;  Surgeon: Doran Stabler, MD;  Location: [facility];  Service: Gastroenterology;  Laterality: N/A;  ? IR GENERIC HISTORICAL  12/17/2015  ? IR US GUIDE BX ASP/DRAIN 12/17/2015 [facility]  ? IR GENERIC HISTORICAL  12/17/2015  ? IR GUIDED DRAIN W CATHETER PLACEMENT 12/17/2015 [facility]  ? IR GENERIC HISTORICAL  12/17/2015  ? IR SINUS/FIST TUBE CHK-NON  GI 12/17/2015 [facility]  ? KNEE SURGERY    ? LUNG SURGERY    ? TEE WITHOUT CARDIOVERSION N/A 12/19/2015  ? Procedure: TRANSESOPHAGEAL ECHOCARDIOGRAM (TEE);  Surgeon: Josue Hector, MD;  Location: [place];  Service: Cardiovascular;  Laterality: N/A;  ? ? ?Her Family History Is Significant For: ?Family History  ?Problem Relation [name] of Onset  ? Cancer Mother 34  ?     breast  ? Alzheimer's disease Mother   ? Prostate cancer Father   ? Cancer Brother   ? Hyperlipidemia Brother   ? Lung cancer Father   ? Dementia Maternal Grandmother   ? Cancer Maternal Grandfather   ? Cancer Brother   ? Hyperlipidemia Brother   ? Dementia Brother   ?     frontotemporal dementia  ? ? ?Her Social History Is Significant For: ?Social History  ? ?Socioeconomic History  ? Marital status: Married  ?  Spouse name: [name]  ? Number of children: 1  ? Years of education: 12+  ? Highest education level: High school graduate  ?Occupational History  ? Occupation: ACTIVITY DIRECTOR  ?  Employer: [employer]  ? Occupation: CNA  ?Tobacco Use  ? Smoking status: Former  ?  Packs/day: 0.30  ?  Types: Cigarettes  ?  Quit date: 11/01/2011  ?  Years since quitting: 9.5  ? Smokeless tobacco: Never  ?Vaping Use  ? Vaping Use: Never used  ?Substance and Sexual Activity  ? Alcohol use: No  ?  Alcohol/week: 0.0 standard drinks  ? Drug use: No  ? Sexual activity: Yes  ?  Partners: Male  ?  Birth control/protection: Surgical  ?  Comment: 1 sexual partner in last 12 months: married  ?Other Topics Concern  ? Not on file  ?Social History Narrative  ? ** Merged History Encounter **  ?    ? Patient is married ([name]). ?Patient has one child and grandchild. Her sons name is [name]. Patient sees them often. ?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 Patient spends 6-10 hours with her 6 days a week.   ?  ?Patient has a college education. ?Patient is very active in her church.  ?Hobbies- outside crafts and church  choir.  ? ?Social Determinants of Health  ? ?Financial Resource Strain: Not on file  ?Food Insecurity: Not on file  ?Transportation Needs: Not on file  ?Physical Activity: Not on file  ?Stress: Not on file  ?Social Connections: Not on file  ? ? ?Her Allergies Are:  ?Allergies  ?Allergen Reactions  ? Iodinated Contrast Media Shortness Of Breath and Itching  ?  States she had this reaction connected with a MRI of the cervical spine.  ? Shellfish Allergy Anaphylaxis  ?  All seafood  ? Iodine   ? Statins Other (See Comments)  ?  Arthralgias (severe) with atorvastatin, mild arthralgias with rosuvastatin  but willing to continue taking it  ?:  ? ?Her Current Medications Are:  ?Outpatient Encounter Medications as of 05/27/2021  ?Medication Sig  ? albuterol (VENTOLIN HFA) 108 (90 Base) MCG/ACT inhaler Inhale 2 puffs into the lungs every 6 (six) hours as needed for wheezing or shortness of breath.  ? cetirizine (ZYRTEC) 10 MG tablet Take 10 mg by mouth daily with breakfast.  ? clopidogrel (PLAVIX) 75 MG tablet Take 75 mg by mouth daily.  ? doxepin (SINEQUAN) 100 MG capsule Take 2 capsules (200 mg total) by mouth at bedtime.  ? ezetimibe (ZETIA) 10 MG tablet Take 10 mg by mouth daily.  ? fluticasone (FLONASE) 50 MCG/ACT nasal spray Place 1 spray into both nostrils daily. (Patient taking differently: Place 1 spray into both nostrils daily as needed for allergies.)  ? furosemide (LASIX) 40 MG tablet Take 40 mg by mouth daily.  ? gabapentin (NEURONTIN) 600 MG tablet TAKE 1 TABLET(600 MG) BY MOUTH TWICE DAILY  ? ondansetron (ZOFRAN) 4 MG tablet Take 1 tablet (4 mg total) by mouth every 8 (eight) hours as needed for nausea or vomiting.  ? pantoprazole (PROTONIX) 40 MG tablet TAKE 1 TABLET(40 MG) BY MOUTH TWICE DAILY  ? Rimegepant Sulfate (NURTEC) 75 MG TBDP Take 75 mg by mouth daily as needed (take for abortive therapy of migraine, no more than 1 tablet in 24 hours or 10 per month).  ? sertraline (ZOLOFT) 50 MG tablet TAKE 1  TABLET(50 MG) BY MOUTH DAILY  ? tiZANidine (ZANAFLEX) 4 MG tablet Take 1 tablet (4 mg total) by mouth every 6 (six) hours as needed for muscle spasms.  ? traMADol (ULTRAM) 50 MG tablet Take 1 tablet (50 mg to

## 2021-05-30 ENCOUNTER — Telehealth: Payer: Self-pay

## 2021-05-30 NOTE — Telephone Encounter (Signed)
LVM for pt to call me back to schedule sleep study  

## 2021-06-03 DIAGNOSIS — Z1211 Encounter for screening for malignant neoplasm of colon: Secondary | ICD-10-CM | POA: Diagnosis not present

## 2021-06-06 DIAGNOSIS — R3129 Other microscopic hematuria: Secondary | ICD-10-CM | POA: Diagnosis not present

## 2021-06-06 DIAGNOSIS — D631 Anemia in chronic kidney disease: Secondary | ICD-10-CM | POA: Diagnosis not present

## 2021-06-06 DIAGNOSIS — N1831 Chronic kidney disease, stage 3a: Secondary | ICD-10-CM | POA: Diagnosis not present

## 2021-06-06 DIAGNOSIS — I129 Hypertensive chronic kidney disease with stage 1 through stage 4 chronic kidney disease, or unspecified chronic kidney disease: Secondary | ICD-10-CM | POA: Diagnosis not present

## 2021-06-06 DIAGNOSIS — N183 Chronic kidney disease, stage 3 unspecified: Secondary | ICD-10-CM | POA: Diagnosis not present

## 2021-06-06 DIAGNOSIS — N2581 Secondary hyperparathyroidism of renal origin: Secondary | ICD-10-CM | POA: Diagnosis not present

## 2021-06-07 ENCOUNTER — Ambulatory Visit (HOSPITAL_BASED_OUTPATIENT_CLINIC_OR_DEPARTMENT_OTHER): Payer: Medicare Other | Admitting: Family

## 2021-06-07 ENCOUNTER — Encounter (HOSPITAL_BASED_OUTPATIENT_CLINIC_OR_DEPARTMENT_OTHER): Payer: Self-pay

## 2021-06-07 ENCOUNTER — Encounter: Payer: Medicare Other | Attending: Physical Medicine & Rehabilitation | Admitting: Physical Medicine & Rehabilitation

## 2021-06-07 ENCOUNTER — Encounter: Payer: Self-pay | Admitting: Physical Medicine & Rehabilitation

## 2021-06-07 VITALS — BP 143/85 | HR 96 | Temp 98.2°F | Ht 66.0 in | Wt 186.0 lb

## 2021-06-07 DIAGNOSIS — G8929 Other chronic pain: Secondary | ICD-10-CM | POA: Diagnosis not present

## 2021-06-07 DIAGNOSIS — M533 Sacrococcygeal disorders, not elsewhere classified: Secondary | ICD-10-CM | POA: Insufficient documentation

## 2021-06-07 DIAGNOSIS — R262 Difficulty in walking, not elsewhere classified: Secondary | ICD-10-CM | POA: Insufficient documentation

## 2021-06-07 DIAGNOSIS — M461 Sacroiliitis, not elsewhere classified: Secondary | ICD-10-CM | POA: Insufficient documentation

## 2021-06-07 NOTE — Progress Notes (Signed)
Right Sacroiliac radio frequency ablation under fluoroscopic guidance ?This consists of  Right L5 dorsal ramus radio frequency ablation plus RF of  Right  S1-S2 -S3 lateral branches ? ?Indication is sacroiliac pain which has improved temporarily onby at least 50% following sacroiliac intra-articular injection and L5 , S1,2,3 Lateral branch blocks under fluoroscopic guidance. ?Pain interferes with self-care and mobility and has failed to respond to conservative measures. ? ?Informed consent was obtained after discussing risks and benefits of the procedure with the patient these include bleeding bruising and infection temporary or permanent paralysis. The patient elects to proceed and has given written consent. ? ?Patient placed prone on fluoroscopy table. Area marked and prepped with Betadine. Fluoroscopic images utilized to guide needle. 25-gauge 1.5 inch needle was used to anesthetize 4 injection points with 2 cc of 1% lidocaine each. Then a 18-gauge 10 cm RF needle with a 10 mm curved active tip was inserted under fluoroscopic guidance first targeting the S1 SA P./sacral ala junction, bone contact made and confirmed with lateral imaging.  motor stimulation at 2 Hz confirm proper needle location followed by injection of one cc of a solution containing   2% lidocaine MPF. Radio frequency 80?C for 80 seconds was performed. ?Then the inferolateral aspect of the S1, S2 and lateral aspect of S3 sacral foramina were targeted. Bone contact made.  motor stim at 2 Hz confirm proper needle location. One ML of the /lidocaine solution was injected into each of 3 sites and radio frequency ablation 80?C for 90 seconds was performed. Patient tolerated procedure well. Post procedure instructions given  ?

## 2021-06-07 NOTE — Patient Instructions (Signed)
You had a radio frequency procedure today This was done to alleviate joint pain in your sacral area We injected lidocaine which is a local anesthetic.  You may experience soreness at the injection sites. You may also experienced some irritation of the nerves that were heated I'm recommending ice for 30 minutes every 2 hours as needed for the next 24-48 hours   

## 2021-06-07 NOTE — Progress Notes (Signed)
?  PROCEDURE RECORD ?[facility] ? ? ?Name: [name] ?DOB:1950/12/16 ?MRN: [id number] ? ?Date:06/07/2021  Physician: Alysia Penna, MD   ? ?Nurse/CMA: Kennon Rounds CMA ? ?Allergies:  ?Allergies  ?Allergen Reactions  ? Iodinated Contrast Media Shortness Of Breath and Itching  ?  States she had this reaction connected with a MRI of the cervical spine.  ? Shellfish Allergy Anaphylaxis  ?  All seafood  ? Iodine   ? Statins Other (See Comments)  ?  Arthralgias (severe) with atorvastatin, mild arthralgias with rosuvastatin but willing to continue taking it  ? ? ?Consent Signed: Yes.    Is patient diabetic? No.  CBG today? N/A ? ?Pregnant: No. LMP: No LMP recorded. Patient has had a hysterectomy. (age 30-55) ? ?Anticoagulants: no ?Anti-inflammatory: no ?Antibiotics: no ? ?Procedure: Right Sacroiliac   Position: Prone ?Start Time: 2:30 pm  End Time: 2:47pm  Fluoro Time: 51 ? ?RN/CMA Marvis Moeller, CMA    ?Time 1:31pm 2:51 pm    ?BP 143/85 189/100    ?Pulse 98 97    ?Respirations 16 16    ?O2 Sat 98 96    ?S/S 6 6    ?Pain Level 9/10 7/10    ? ?D/C home with Self, [name] & O X 3, D/C instructions reviewed, and sits independently. ? ? ? ? ? ? ? ?

## 2021-06-10 ENCOUNTER — Encounter: Payer: Self-pay | Admitting: Student

## 2021-06-10 LAB — COLOGUARD: COLOGUARD: NEGATIVE

## 2021-06-10 NOTE — Progress Notes (Signed)
Letter with negative cologaurd results sent  ?

## 2021-06-14 ENCOUNTER — Other Ambulatory Visit: Payer: Self-pay | Admitting: Neurology

## 2021-06-14 ENCOUNTER — Other Ambulatory Visit: Payer: Self-pay | Admitting: Student

## 2021-06-14 NOTE — Telephone Encounter (Signed)
Pt called needing a refill request for her zonisamide (ZONEGRAN) 100 MG capsule and her tiZANidine (ZANAFLEX) 4 MG tablet [facility] [address]  ?

## 2021-06-17 NOTE — Telephone Encounter (Signed)
Patient returned my call.  She states she takes zonisamide 100 mg twice a day and occasionally adds a 25 mg capsule if needed to the dosing.  She states this is not new and is something she had decided with Amy previously.  I let her know we would review and send prescription refill.  Patient very appreciative and her questions were answered. ?

## 2021-06-17 NOTE — Telephone Encounter (Signed)
Called pt & LVM with office number (ok per DPR) asking for call back to clarify her dosage of the Zonisamide.  ?

## 2021-06-17 NOTE — Telephone Encounter (Signed)
Need to clarify with patient exactly how she is taking Zonisamide. She has prescriptions for both 100 mg 25 mg caps that she takes together but is this twice daily or once daily? ?

## 2021-06-18 ENCOUNTER — Telehealth: Payer: Self-pay | Admitting: *Deleted

## 2021-06-18 MED ORDER — ZONISAMIDE 100 MG PO CAPS: 100.0000 mg | ORAL_CAPSULE | Freq: Two times a day (BID) | ORAL | 0 refills | Status: DC

## 2021-06-18 MED ORDER — TRAMADOL HCL 50 MG PO TABS: 50.0000 mg | ORAL_TABLET | Freq: Three times a day (TID) | ORAL | 2 refills | Status: DC | PRN

## 2021-06-18 NOTE — Telephone Encounter (Signed)
[name] is asking for a refill on her Tramadol [facility]. Per PMP last refill was 12/09/20. ?

## 2021-06-18 NOTE — Telephone Encounter (Signed)
Spoke with patient and gave her Amy NP's message. Pt was very understanding and appreciative. [facility]. Pt states she doesn't need a refill of the 25 mg. She just picked that up yesterday.  ?

## 2021-06-18 NOTE — Telephone Encounter (Signed)
[id number] until she talks to Dr. or nurse. Would like a call back.  ?

## 2021-06-19 DIAGNOSIS — M170 Bilateral primary osteoarthritis of knee: Secondary | ICD-10-CM | POA: Diagnosis not present

## 2021-06-19 DIAGNOSIS — M1711 Unilateral primary osteoarthritis, right knee: Secondary | ICD-10-CM | POA: Diagnosis not present

## 2021-06-25 ENCOUNTER — Telehealth: Payer: Self-pay | Admitting: Neurology

## 2021-06-25 MED ORDER — TIZANIDINE HCL 4 MG PO TABS: 4.0000 mg | ORAL_TABLET | Freq: Four times a day (QID) | ORAL | 2 refills | Status: DC | PRN

## 2021-06-25 NOTE — Telephone Encounter (Signed)
Pt request refill for tiZANidine (ZANAFLEX) 4 MG tablet at [id number] 

## 2021-06-25 NOTE — Telephone Encounter (Signed)
Refill sent.

## 2021-06-27 ENCOUNTER — Ambulatory Visit (HOSPITAL_BASED_OUTPATIENT_CLINIC_OR_DEPARTMENT_OTHER): Payer: Medicare Other | Admitting: Family

## 2021-06-27 ENCOUNTER — Ambulatory Visit (INDEPENDENT_AMBULATORY_CARE_PROVIDER_SITE_OTHER): Payer: Medicare Other | Admitting: Family Medicine

## 2021-06-27 ENCOUNTER — Telehealth: Payer: Self-pay | Admitting: *Deleted

## 2021-06-27 ENCOUNTER — Encounter: Payer: Self-pay | Admitting: Family Medicine

## 2021-06-27 VITALS — BP 140/84 | HR 100 | Ht 66.0 in | Wt 182.0 lb

## 2021-06-27 DIAGNOSIS — R519 Headache, unspecified: Secondary | ICD-10-CM

## 2021-06-27 DIAGNOSIS — G4700 Insomnia, unspecified: Secondary | ICD-10-CM | POA: Diagnosis not present

## 2021-06-27 DIAGNOSIS — G43009 Migraine without aura, not intractable, without status migrainosus: Secondary | ICD-10-CM | POA: Diagnosis not present

## 2021-06-27 MED ORDER — ZONISAMIDE 100 MG PO CAPS: 100.0000 mg | ORAL_CAPSULE | Freq: Two times a day (BID) | ORAL | 3 refills | Status: DC

## 2021-06-27 MED ORDER — DOXEPIN HCL 100 MG PO CAPS: 200.0000 mg | ORAL_CAPSULE | Freq: Every day | ORAL | 3 refills | Status: DC

## 2021-06-27 MED ORDER — ZONISAMIDE 25 MG PO CAPS: ORAL_CAPSULE | ORAL | 3 refills | Status: DC

## 2021-06-27 MED ORDER — NURTEC 75 MG PO TBDP: 75.0000 mg | ORAL_TABLET | Freq: Every day | ORAL | 11 refills | Status: DC | PRN

## 2021-06-27 NOTE — Telephone Encounter (Signed)
Submitted PA Nurtec on CMM. Key: [id number]. Waiting on determination from [facility].  ?

## 2021-06-27 NOTE — Patient Instructions (Signed)
Below is our plan: ? ?We will continue zonisamide '100mg'$  twice daily. May take extra '25mg'$  dose twice daily as needed. Continue doxepin '200mg'$  daily for now. Please schedule sleep asap. I advise against taking trazodone with doxepin. Once headache improve and sleep study performed, we will start weaning for doxepin.  ? ?Please make sure you are staying well hydrated. I recommend 50-60 ounces daily. Well balanced diet and regular exercise encouraged. Consistent sleep schedule with 6-8 hours recommended.  ? ?Please continue follow up with care team as directed.  ? ?Follow up with me as scheduled in August.  ? ?You may receive a survey regarding today's visit. I encourage you to leave honest feed back as I do use this information to improve patient care. Thank you for seeing me today!  ? ? ?

## 2021-06-27 NOTE — Telephone Encounter (Signed)
PA approved 03/10/2021 - 06/27/2022.  ?

## 2021-06-27 NOTE — Progress Notes (Signed)
? ? ?PATIENT: [name] ?DOB: 23-Dec-1950 ? ?REASON FOR VISIT: follow up ?HISTORY FROM: patient ? ?Chief Complaint  ?Patient presents with  ? Migraine  ?  Rm 7, 4 month FU   "pharmacy messed up my medicines, my headaches are bad"   ? ?  ?HISTORY OF PRESENT ILLNESS: ? ?02/26/2021 ALL: ?[name] returns for follow up for migraines. She continues zonisamide '100mg'$  BID with extra '25mg'$  dose as needed. She also takes doxepin '200mg'$  at bedtime for insomnia. PCP also writes 25-'50mg'$  trazodone for insomnia. She was seen in consult with Dr Rexene Alberts 05/2021 and advised against taking two sleep aids. HST ordered. She was encouraged to decrease caffeine intake.  ? ?She has been off zonisamide for 2 weeks. She reports headaches are significantly worse. She has had a bad headache for the past few days. She was able to pick up all meds yesterday and restarted last night. Nurtec She reports that she does not drink caffeine. She endorses significant stress. She is caring for her brother and husband who are both ill. She is not sleeping well despite taking doxepin '200mg'$  and trazodone 25-'50mg'$  (from PCP). She wakes multiple times at night. She has not scheduled sleep study due to being limited on time. She reports BP has been elevated this week. She feels that it is elevated due to headaches from being off medications.  ? ?02/26/2021 ALL:  ?[name] returns with concerns of worsening headaches. She continues zonisamide '100mg'$  BID with extra '25mg'$  dose daily as needed. She reports worsening over the past 1-2 months. Headaches are more frequent (near daily) and more intense. Similar symptoms has previous headaches. No new symptoms. Most are present in the morning. Can worsen during the day. She has had an intractable migraine for the past 3 days. Tizanidine and Tylenol usually abort migraines but she has not gotten much relief. She continues doxepin '200mg'$  QHS for insomnia managed with our office. Trazodone 25-50 prescribed with PCP. She has  interrupted sleep. She does snore. She wakes multiple times at night. She does not wake refreshed. She does have dry mouth. Brother has sleep apnea on CPAP.  ? ?Medications tried and failed: Ajovy (ineffective), topiramate, zonisamide (on now), trazodone (on now), sertraline (on now), gabapentin (on now), propranolol, Nurtec, tramadol (takes at night), tizanidine, ondansetron, cyclobenzaprine, metoclopramide, triptans contraindicated due to PVD, CVA and TIA history  ? ?10/10/2020 ALL: ?[name] returns for follow up for migraines and insomnia. She is doing well. She continues zonisamide '100mg'$  BID and doxepin '200mg'$  at bedtime. She has had several stressors since we last saw her. Her brother had to have emergency surgery so she has had to help care for him and his hair salon.  She is seeing Dr Rolena Infante, [facility], and Dr Letta Pate, sports med, for chronic neck and back pain. Dr Letta Pate has her on gabapentin '600mg'$  twice daily that helps some. Dr Stann Mainland for broken shoulder and Dr Margit Banda for chronic knee pain.  ? ?Headaches are occurring most days. She feels that she has pain starting in her neck that radiates to the top of her head everyday around 3pm. She has not tried taking extra '25mg'$  dose of imipramine. She is sleeping well. She is tolerating doxepin.  ? ?10/10/2019 ALL:  ?[name] is a 71 y.o. female here today for follow up for headaches. She continues zonisamide '100mg'$  daily. She has a '25mg'$  dose that she rarely takes. She has daily headaches, mostly in the mornings, that are usually easily treatable. She does  note that weather changes are a trigger for her. She is able to abort headaches with rest. She may have 1-2 migraines per month.  ? ?She continues doxepin '200mg'$  at night for insomnia. She has been on this medication for years. She reports that she has a difficult time sleeping. She struggles to go to sleep at night and to stay asleep. She does wake frequently at night to use the restroom. She wakes with a  dry mouth. She is not sure if she snores. She has a brother who uses CPAP therapy.  ? ?HISTORY: (copied from my note on 12/09/2018) ? ?[name] is a 71 y.o. female here today for follow up for migraines. She started Ajovy about a year ago. She felt that it helped initially but after a few months it did not seem to help. She has daily dull headaches, usually behind the right eye. About 1-2 times a month she has more severe headache with light, sound sensitivity and nausea. She continues zonisamide '100mg'$  and tizanidine '2mg'$  daily. She takes gabapentin '600mg'$  1-2 times daily for headaches and dysesthesias in her left side. Doxepin helps with sleep. She is followed closely by PCP for BP and cholesterol management. She is intolerant to statins, now on Repatha. On Plavix.  ?  ?HISTORY: (copied from [place] note on 03/06/2018) ?  ?[name] is a 71 year old female with a history of intractable migraine headaches.  She returns today for follow-up.  Overall she feels that she has done relatively well.  He continues to have a daily dull headache that is in the right temporal region.  However she states that she has not had a severe headache in the last 6 months.  She reports she does have photophobia with her headaches.  She states that if she misses her medication then her headache will worsen.  She reports that she was unable to obtain Aimovig.  She continues on Zonegran and tizanidine. ?  ?HISTORY 07/01/17 ?[name] is a 71 year old female with a history of intractable migraine headaches.  She returns today for follow-up.  She reports that she continues to have a daily dull headache.  She remains on Zonegran and tizanidine.  She states that she did receive a letter about Aimovig but was unable to follow-up on this due to her brother being in the hospital.  She states that she only has approximately one severe migraine a month.  Her headaches seem to occur on the right side starting at the neck radiating to  the eyes.  She does have photophobia, phonophobia and nausea.  She continues to take doxepin at night.  She returns today for an evaluation. ?  ? ?REVIEW OF SYSTEMS: Out of a complete 14 system review of symptoms, the patient complains only of the following symptoms, morning headaches, dry mouth, fatigue, chronic pain, insomnia and all other reviewed systems are negative. ? ?ALLERGIES: ?Allergies  ?Allergen Reactions  ? Iodinated Contrast Media Shortness Of Breath and Itching  ?  States she had this reaction connected with a MRI of the cervical spine.  ? Shellfish Allergy Anaphylaxis  ?  All seafood  ? Iodine   ? Statins Other (See Comments)  ?  Arthralgias (severe) with atorvastatin, mild arthralgias with rosuvastatin but willing to continue taking it  ? ? ?HOME MEDICATIONS: ?Outpatient Medications Prior to Visit  ?Medication Sig Dispense Refill  ? albuterol (VENTOLIN HFA) 108 (90 Base) MCG/ACT inhaler Inhale 2 puffs into the lungs every 6 (six) hours as  needed for wheezing or shortness of breath. 8 g 6  ? cetirizine (ZYRTEC) 10 MG tablet Take 10 mg by mouth daily with breakfast.    ? clopidogrel (PLAVIX) 75 MG tablet Take 75 mg by mouth daily.    ? ezetimibe (ZETIA) 10 MG tablet Take 10 mg by mouth daily.    ? fluticasone (FLONASE) 50 MCG/ACT nasal spray Place 1 spray into both nostrils daily. (Patient taking differently: Place 1 spray into both nostrils daily as needed for allergies.)  2  ? furosemide (LASIX) 40 MG tablet Take 40 mg by mouth daily.    ? gabapentin (NEURONTIN) 600 MG tablet TAKE 1 TABLET(600 MG) BY MOUTH TWICE DAILY 60 tablet 11  ? ondansetron (ZOFRAN) 4 MG tablet Take 1 tablet (4 mg total) by mouth every 8 (eight) hours as needed for nausea or vomiting. 20 tablet 0  ? pantoprazole (PROTONIX) 40 MG tablet TAKE 1 TABLET(40 MG) BY MOUTH TWICE DAILY 90 tablet 1  ? sertraline (ZOLOFT) 50 MG tablet TAKE 1 TABLET(50 MG) BY MOUTH DAILY 60 tablet 1  ? tiZANidine (ZANAFLEX) 4 MG tablet Take 1 tablet (4 mg  total) by mouth every 6 (six) hours as needed for muscle spasms. 60 tablet 2  ? traMADol (ULTRAM) 50 MG tablet Take 1 tablet (50 mg total) by mouth every 8 (eight) hours as needed for moderate pain. 90 ta

## 2021-06-28 ENCOUNTER — Ambulatory Visit (INDEPENDENT_AMBULATORY_CARE_PROVIDER_SITE_OTHER): Payer: Medicare Other | Admitting: Family

## 2021-06-28 ENCOUNTER — Encounter (HOSPITAL_BASED_OUTPATIENT_CLINIC_OR_DEPARTMENT_OTHER): Payer: Self-pay | Admitting: Family

## 2021-06-28 VITALS — BP 158/88 | HR 86 | Ht 66.0 in | Wt 182.9 lb

## 2021-06-28 DIAGNOSIS — I10 Essential (primary) hypertension: Secondary | ICD-10-CM | POA: Diagnosis not present

## 2021-06-28 DIAGNOSIS — E785 Hyperlipidemia, unspecified: Secondary | ICD-10-CM

## 2021-06-28 DIAGNOSIS — I25118 Atherosclerotic heart disease of native coronary artery with other forms of angina pectoris: Secondary | ICD-10-CM

## 2021-06-28 DIAGNOSIS — Z79899 Other long term (current) drug therapy: Secondary | ICD-10-CM | POA: Diagnosis not present

## 2021-06-28 MED ORDER — EZETIMIBE 10 MG PO TABS: 10.0000 mg | ORAL_TABLET | Freq: Every day | ORAL | 3 refills | Status: DC

## 2021-06-28 MED ORDER — CLOPIDOGREL BISULFATE 75 MG PO TABS: 75.0000 mg | ORAL_TABLET | Freq: Every day | ORAL | 3 refills | Status: DC

## 2021-06-28 MED ORDER — AMLODIPINE BESYLATE 5 MG PO TABS: 5.0000 mg | ORAL_TABLET | Freq: Every day | ORAL | 3 refills | Status: DC

## 2021-06-28 MED ORDER — ROSUVASTATIN CALCIUM 5 MG PO TABS: 5.0000 mg | ORAL_TABLET | Freq: Every day | ORAL | 3 refills | Status: DC

## 2021-06-28 NOTE — Patient Instructions (Signed)
Medication Instructions:  ?Your physician has recommended you make the following change in your medication:  ? ?START Amlodipine '5mg'$  daily ? ?START Zetia '10mg'$  daily ? ?START Crestor '5mg'$  daily ? ?START Plavix '75mg'$  daily ?  ?*If you need a refill on your cardiac medications before your next appointment, please call your pharmacy* ? ? ?Lab Work: ?Your physician recommends that you return for lab work in 2 months for fasting lipid panel and CMET ? ?Please return for Lab work. You may come to the...  ? ?[place] (3rd floor) ?[address], [place], [place]  ?Open: 8am-Noon and 1pm-4:30pm  ? ?[place] [facility] ?[place]  ? ?[employer]- Any location ? ?**no appointments needed**  ? ?If you have labs (blood work) drawn today and your tests are completely normal, you will receive your results only by: ?MyChart Message (if you have MyChart) OR ?A paper copy in the mail ?If you have any lab test that is abnormal or we need to change your treatment, we will call you to review the results. ? ? ?Testing/Procedures: ?Your EKG today showed normal sinus rhythm.  ? ? ?Follow-Up: ?At [facility], you and your health needs are our priority.  As part of our continuing mission to provide you with exceptional heart care, we have created designated Provider Care Teams.  These Care Teams include your primary Cardiologist (physician) and Advanced Practice Providers (APPs -  Physician Assistants and Nurse Practitioners) who all work together to provide you with the care you need, when you need it. ? ?We recommend signing up for the patient portal called "MyChart".  Sign up information is provided on this After Visit Summary.  MyChart is used to connect with patients for Virtual Visits (Telemedicine).  Patients are able to view lab/test results, encounter notes, upcoming appointments, etc.  Non-urgent messages can be sent to your provider as well.   ?To learn more about  what you can do with MyChart, go to [email or web address].   ? ?Your next appointment:   ?3 month(s) ? ?The format for your next appointment:   ?In Person ? ?Provider:   ?Skeet Latch, MD or Loel Dubonnet, NP   ? ? ?Other Instructions ? ?We will get you set up with the pharmacist to discuss new cholesterol medications.  ?Heart Healthy Diet Recommendations: ?A low-salt diet is recommended. Meats should be grilled, baked, or boiled. Avoid fried foods. Focus on lean protein sources like fish or chicken with vegetables and fruits. The American Heart Association is a Microbiologist!  American Heart Association Diet and Lifeystyle Recommendations  ? ?Exercise recommendations: ?The American Heart Association recommends 150 minutes of moderate intensity exercise weekly. ?Try 30 minutes of moderate intensity exercise 4-5 times per week. ?This could include walking, jogging, or swimming. ? ? ? ?Important Information About Sugar ? ? ? ? ?  ?

## 2021-06-28 NOTE — Addendum Note (Signed)
Addended by: Vennie Homans on: 06/28/2021 02:33 PM ? ? Modules accepted: Orders ? ?

## 2021-06-28 NOTE — Progress Notes (Signed)
? ?Office Visit  ?  ?Patient Name: [name] ?Date of Encounter: 06/28/2021 ? ?PCP:  Precious Gilding, DO ?  ?Miguel Barrera  ?Cardiologist:  Skeet Latch, MD  ?Advanced Practice Provider:  No care team member to display ?Electrophysiologist:  None  ?   ? ?Chief Complaint  ?  ?[name] is a 71 y.o. female with a hx of HTN, coronary artery calcification, HLD, CKD III, CVA, left subclavian artery stenosis, prior tobacco use presents today for follow-up of hypertension, hyperlipidemia ? ?Past Medical History  ?  ?Past Medical History:  ?Diagnosis Date  ? Anemia   ? Cervical neuritis   ? Chronic kidney disease   ? stage 3  ? Dyslipidemia   ? GERD (gastroesophageal reflux disease)   ? History of transient ischemic attack (TIA)   ? HTN (hypertension)   ? Hyperlipidemia   ? Migraine   ? Migraine headache   ? Occipital neuralgia   ? Left  ? Pulmonary embolism ([place]) 2017  ? Renal insufficiency   ? Stroke [facility]) 10/2013  ? ?Past Surgical History:  ?Procedure Laterality Date  ? ABDOMINAL HYSTERECTOMY    ? ANTERIOR CERVICAL DECOMP/DISCECTOMY FUSION N/A 05/24/2020  ? Procedure: ANTERIOR CERVICAL DECOMPRESSION/DISCECTOMY FUSION 1 LEVEL C3-4;  Surgeon: Melina Schools, MD;  Location: [facility];  Service: Orthopedics;  Laterality: N/A;  3 hrs  ? BILIARY STENT PLACEMENT N/A 12/12/2015  ? Procedure: BILIARY STENT PLACEMENT;  Surgeon: Doran Stabler, MD;  Location: [facility] ENDOSCOPY;  Service: Endoscopy;  Laterality: N/A;  ? BLADDER REPAIR    ? CARPAL TUNNEL RELEASE    ? CERVICAL FUSION    ? CERVICAL SPINE SURGERY    ? CHOLECYSTECTOMY N/A 12/06/2015  ? Procedure: LAPAROSCOPIC CHOLECYSTECTOMY WITH  INTRAOPERATIVE CHOLANGIOGRAM;  Surgeon: Stark Klein, MD;  Location: [place];  Service: General;  Laterality: N/A;  ? ELBOW SURGERY    ? ERCP N/A 12/12/2015  ? Procedure: ENDOSCOPIC RETROGRADE CHOLANGIOPANCREATOGRAPHY (ERCP);  Surgeon: Doran Stabler, MD;  Location: [facility] ENDOSCOPY;  Service: Endoscopy;  Laterality:  N/A;  ? ESOPHAGOGASTRODUODENOSCOPY N/A 01/28/2016  ? Procedure: ESOPHAGOGASTRODUODENOSCOPY (EGD) Biliary STENT removal;  Surgeon: Doran Stabler, MD;  Location: [facility] ENDOSCOPY;  Service: Gastroenterology;  Laterality: N/A;  ? IR GENERIC HISTORICAL  12/17/2015  ? IR US GUIDE BX ASP/DRAIN 12/17/2015 [facility]INTER[facility] RAD  ? IR GENERIC HISTORICAL  12/17/2015  ? IR GUIDED DRAIN W CATHETER PLACEMENT 12/17/2015 [facility]  ? IR GENERIC HISTORICAL  12/17/2015  ? IR SINUS/FIST TUBE CHK-NON GI 12/17/2015 [facility]  ? KNEE SURGERY    ? LUNG SURGERY    ? TEE WITHOUT CARDIOVERSION N/A 12/19/2015  ? Procedure: TRANSESOPHAGEAL ECHOCARDIOGRAM (TEE);  Surgeon: Josue Hector, MD;  Location: [place];  Service: Cardiovascular;  Laterality: N/A;  ? ? ?Allergies ? ?Allergies  ?Allergen Reactions  ? Iodinated Contrast Media Shortness Of Breath and Itching  ?  States she had this reaction connected with a MRI of the cervical spine.  ? Shellfish Allergy Anaphylaxis  ?  All seafood  ? Iodine   ? Statins Other (See Comments)  ?  Arthralgias (severe) with atorvastatin, mild arthralgias with rosuvastatin but willing to continue taking it  ? ? ?History of Present Illness  ?  ?[name] is a 71 y.o. female with a hx of HTN, coronary artery calcification, HLD, CKD III, CVA, left subclavian artery stenosis, prior tobacco use last seen 05/22/2020. ? ?She was admitted May 2019 with  shortness of breath.  Lower extremity Doppler negative for DVT.  VQ scan showed perfusion defect deemed intermittent probability.  She was treated with Eliquis for 6 months.  Myoview July 2019 EF 66%, no ischemia.  Repeat Myoview October 2021 EF 66%, apical anterior and apical defect due to breast attenuation.  Referred to pulmonology and felt to be related to deconditioning and reactive airway disease, treated with Breo and albuterol.  When seen 03/16/2020 by Dr. Oval Linsey amlodipine was increased to 5 mg due to hypertension.  When seen 05/22/2020 she was provided  clearance for anterior cervical vasectomy and fusion of C3-4 ? ?She presents today for follow-up independently. Shares with me each Friday she comes to [facility] to help her brother clean his home as he has limited mobility. She also acts as a Engineer, structural during the week. Reports no shortness of breath nor dyspnea on exertion. Reports no chest pain, pressure, or tightness. No edema, orthopnea, PND. Reports no palpitations.  Reports BP at home most often in the 140s.  She has not been taking Zetia, Plavix, Crestor, amlodipine, Praluent for some time.Tells me when she took Praluent she noted itching and swelling. She trialed Rosuvastatin '10mg'$  but did not tolerate.  Did tolerate 5 mg dose and was willing to resume.  No previous side effects to Zetia. Tells me she did not tolerate '10mg'$  dose of Amlodipine "due to worsening migraine" but agreeable to resume 5 mg dose..  ? ?EKGs/Labs/Other Studies Reviewed:  ? ?The following studies were reviewed today: ? ?EKG:  EKG is ordered today.  The ekg ordered today demonstrates SR 86 with rightward axis ? ?Recent Labs: ?07/20/2020: BNP CANCELED; BUN 12; Creatinine, Ser 1.18; Potassium 3.5; Sodium 139  ?Recent Lipid Panel ?   ?Component Value Date/Time  ? CHOL 210 (H) 06/13/2019 7628  ? TRIG 224 (H) 06/13/2019 3151  ? HDL 48 06/13/2019 0927  ? CHOLHDL 4.4 06/13/2019 0927  ? CHOLHDL 2.2 07/13/2015 1056  ? VLDL 10 07/13/2015 1056  ? [place] 123 (H) 06/13/2019 7616  ? LDLDIRECT 156 (H) 03/27/2017 0737  ? ? ?Home Medications  ? ?Current Meds  ?Medication Sig  ? albuterol (VENTOLIN HFA) 108 (90 Base) MCG/ACT inhaler Inhale 2 puffs into the lungs every 6 (six) hours as needed for wheezing or shortness of breath.  ? cetirizine (ZYRTEC) 10 MG tablet Take 10 mg by mouth daily with breakfast.  ? doxepin (SINEQUAN) 100 MG capsule Take 2 capsules (200 mg total) by mouth at bedtime.  ? ezetimibe (ZETIA) 10 MG tablet Take 10 mg by mouth daily.  ? fluticasone (FLONASE) 50 MCG/ACT nasal spray  Place 1 spray into both nostrils daily. (Patient taking differently: Place 1 spray into both nostrils daily as needed for allergies.)  ? furosemide (LASIX) 40 MG tablet Take 40 mg by mouth daily.  ? gabapentin (NEURONTIN) 600 MG tablet TAKE 1 TABLET(600 MG) BY MOUTH TWICE DAILY  ? ondansetron (ZOFRAN) 4 MG tablet Take 1 tablet (4 mg total) by mouth every 8 (eight) hours as needed for nausea or vomiting.  ? pantoprazole (PROTONIX) 40 MG tablet TAKE 1 TABLET(40 MG) BY MOUTH TWICE DAILY  ? Rimegepant Sulfate (NURTEC) 75 MG TBDP Take 75 mg by mouth daily as needed (take for abortive therapy of migraine, no more than 1 tablet in 24 hours or 10 per month).  ? sertraline (ZOLOFT) 50 MG tablet TAKE 1 TABLET(50 MG) BY MOUTH DAILY  ? tiZANidine (ZANAFLEX) 4 MG tablet Take 1 tablet (4 mg total) by mouth  every 6 (six) hours as needed for muscle spasms.  ? traMADol (ULTRAM) 50 MG tablet Take 1 tablet (50 mg total) by mouth every 8 (eight) hours as needed for moderate pain.  ? traZODone (DESYREL) 50 MG tablet TAKE 1/2 TO 1 TABLET(25 TO 50 MG) BY MOUTH AT BEDTIME AS NEEDED FOR SLEEP  ? zonisamide (ZONEGRAN) 100 MG capsule Take 1 capsule (100 mg total) by mouth 2 (two) times daily.  ? zonisamide (ZONEGRAN) 25 MG capsule take 25 mg twice daily prn in addition to 100 mg twice daily.  ?  ? ?Review of Systems  ?    ?All other systems reviewed and are otherwise negative except as noted above. ? ?Physical Exam  ?  ?VS:  BP (!) 158/88   Pulse 86   Ht '5\' 6"'$  (1.676 m)   Wt 182 lb 14.4 oz (83 kg)   BMI 29.52 kg/m?  , BMI Body mass index is 29.52 kg/m?. ? ?Wt Readings from Last 3 Encounters:  ?06/28/21 182 lb 14.4 oz (83 kg)  ?06/27/21 182 lb (82.6 kg)  ?06/07/21 186 lb (84.4 kg)  ?  ?GEN: Well nourished, well developed, in no acute distress. ?HEENT: normal. ?Neck: Supple, no JVD, carotid bruits, or masses. ?Cardiac: RRR, no murmurs, rubs, or gallops. No clubbing, cyanosis, edema.  Radials/PT 2+ and equal bilaterally.  ?Respiratory:   Respirations regular and unlabored, clear to auscultation bilaterally. ?GI: Soft, nontender, nondistended. ?MS: No deformity or atrophy. ?Skin: Warm and dry, no rash. ?Neuro:  Strength and sensation are intact. ?Psy

## 2021-07-16 ENCOUNTER — Other Ambulatory Visit: Payer: Self-pay | Admitting: Physical Medicine & Rehabilitation

## 2021-07-19 ENCOUNTER — Other Ambulatory Visit: Payer: Self-pay | Admitting: Physical Medicine & Rehabilitation

## 2021-07-23 ENCOUNTER — Encounter: Payer: Medicare Other | Attending: Physical Medicine & Rehabilitation | Admitting: Physical Medicine & Rehabilitation

## 2021-07-23 ENCOUNTER — Other Ambulatory Visit: Payer: Self-pay | Admitting: Physical Medicine & Rehabilitation

## 2021-07-23 ENCOUNTER — Encounter: Payer: Self-pay | Admitting: Physical Medicine & Rehabilitation

## 2021-07-23 VITALS — BP 137/87 | HR 73 | Ht 66.0 in | Wt 185.8 lb

## 2021-07-23 DIAGNOSIS — Z79891 Long term (current) use of opiate analgesic: Secondary | ICD-10-CM | POA: Diagnosis not present

## 2021-07-23 DIAGNOSIS — Z5181 Encounter for therapeutic drug level monitoring: Secondary | ICD-10-CM | POA: Insufficient documentation

## 2021-07-23 DIAGNOSIS — I25118 Atherosclerotic heart disease of native coronary artery with other forms of angina pectoris: Secondary | ICD-10-CM

## 2021-07-23 DIAGNOSIS — G894 Chronic pain syndrome: Secondary | ICD-10-CM | POA: Insufficient documentation

## 2021-07-23 NOTE — Progress Notes (Signed)
? ?Subjective:  ? ? Patient ID: [name], female    DOB: 11/15/1950, 71 y.o.   MRN: [id number] ? ?HPI ?71 year old female with prior work-related injury causing intercostal neuritis has subsequently declined lumbar stenosis as well as pelvic fracture after a fall.  She was requiring high-dose narcotic analgesics but has been weaned down.  She has been seen by orthopedic spine surgery.  She has undergone sacroiliac injections with short-term relief as well as lateral branch blocks also causing short-term relief.  This was followed by sacroiliac radiofrequency neurotomy. ?Her severe pain has improved she has had no radicular symptoms.  Her pain duration has been for several years.  MRI of the spine has revealed no worrisome findings such as infection or tumor.  She has had only partial relief with oral medications has had home exercise program as well as physical therapy also resulting in only a partial relief. ?Left buttocks pain increasing over the last 6 wks,  No fals or new trauma ?Last Sacroiliac RF was ~25moago ? ?Right SI RF on 06/07/21 has excellent pain relief ? ?Only takes 1 tramadol per day as needed, Rx refilled by Dr SChauncey Cruelyesterday  ?Pain Inventory ?Average Pain 8 ?Pain Right Now 8 ?My pain is intermittent ? ?In the last 24 hours, has pain interfered with the following? ?General activity 10 ?Relation with others 8 ?Enjoyment of life 8 ?What TIME of day is your pain at its worst? daytime and night ?Sleep (in general) Poor ? ?Pain is worse with: walking, inactivity, and standing ?Pain improves with: rest, heat/ice, and medication ?Relief from Meds: 5 ? ?Family History  ?Problem Relation Age of Onset  ? Cancer Mother 721 ?     breast  ? Alzheimer's disease Mother   ? Prostate cancer Father   ? Cancer Brother   ? Hyperlipidemia Brother   ? Lung cancer Father   ? Dementia Maternal Grandmother   ? Cancer Maternal Grandfather   ? Cancer Brother   ? Hyperlipidemia Brother   ? Dementia Brother   ?      frontotemporal dementia  ? ?Social History  ? ?Socioeconomic History  ? Marital status: Married  ?  Spouse name: [name]  ? Number of children: 1  ? Years of education: 12+  ? Highest education level: High school graduate  ?Occupational History  ? Occupation: ACTIVITY DIRECTOR  ?  Employer: [employer]  ? Occupation: CNA  ?Tobacco Use  ? Smoking status: Former  ?  Packs/day: 0.30  ?  Types: Cigarettes  ?  Quit date: 11/01/2011  ?  Years since quitting: 9.7  ? Smokeless tobacco: Never  ?Vaping Use  ? Vaping Use: Never used  ?Substance and Sexual Activity  ? Alcohol use: No  ?  Alcohol/week: 0.0 standard drinks  ? Drug use: No  ? Sexual activity: Yes  ?  Partners: Male  ?  Birth control/protection: Surgical  ?  Comment: 1 sexual partner in last 12 months: married  ?Other Topics Concern  ? Not on file  ?Social History Narrative  ?  Patient is married ([name]).  ? Patient has one child and grandchild. Her sons name is DShanon Brow Patient sees them often.  ? 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 Patient spends 6-10 hours with her 6 days a week.   ? Patient has a college education.  ? Patient is very active in her church.   ? Hobbies-  outside crafts and church choir.  ? ?Social Determinants of Health  ? ?Financial Resource Strain: Not on file  ?Food Insecurity: Not on file  ?Transportation Needs: Not on file  ?Physical Activity: Not on file  ?Stress: Not on file  ?Social Connections: Not on file  ? ?Past Surgical History:  ?Procedure Laterality Date  ? ABDOMINAL HYSTERECTOMY    ? ANTERIOR CERVICAL DECOMP/DISCECTOMY FUSION N/A 05/24/2020  ? Procedure: ANTERIOR CERVICAL DECOMPRESSION/DISCECTOMY FUSION 1 LEVEL C3-4;  Surgeon: Melina Schools, MD;  Location: [place];  Service: Orthopedics;  Laterality: N/A;  3 hrs  ? BILIARY STENT PLACEMENT N/A 12/12/2015  ? Procedure: BILIARY STENT PLACEMENT;  Surgeon: Doran Stabler, MD;  Location: [place] [facility];  Service: Endoscopy;  Laterality:  N/A;  ? BLADDER REPAIR    ? CARPAL TUNNEL RELEASE    ? CERVICAL FUSION    ? CERVICAL SPINE SURGERY    ? CHOLECYSTECTOMY N/A 12/06/2015  ? Procedure: LAPAROSCOPIC CHOLECYSTECTOMY WITH  INTRAOPERATIVE CHOLANGIOGRAM;  Surgeon: Stark Klein, MD;  Location: [place];  Service: General;  Laterality: N/A;  ? ELBOW SURGERY    ? ERCP N/A 12/12/2015  ? Procedure: ENDOSCOPIC RETROGRADE CHOLANGIOPANCREATOGRAPHY (ERCP);  Surgeon: Doran Stabler, MD;  Location: [facility] END[facility];  Service: Endoscopy;  Laterality: N/A;  ? ESOPHAGOGASTRODUODENOSCOPY N/A 01/28/2016  ? Procedure: ESOPHAGOGASTRODUODENOSCOPY (EGD) Biliary STENT removal;  Surgeon: Doran Stabler, MD;  Location: [facility];  Service: Gastroenterology;  Laterality: N/A;  ? IR GENERIC HISTORICAL  12/17/2015  ? IR US GUIDE BX ASP/DRAIN 12/17/2015 [facility]  ? IR GENERIC HISTORICAL  12/17/2015  ? IR GUIDED DRAIN W CATHETER PLACEMENT 12/17/2015 [facility]  ? IR GENERIC HISTORICAL  12/17/2015  ? IR SINUS/FIST TUBE CHK-NON GI 12/17/2015 [facility]  ? KNEE SURGERY    ? LUNG SURGERY    ? TEE WITHOUT CARDIOVERSION N/A 12/19/2015  ? Procedure: TRANSESOPHAGEAL ECHOCARDIOGRAM (TEE);  Surgeon: Josue Hector, MD;  Location: [place];  Service: Cardiovascular;  Laterality: N/A;  ? ?Past Surgical History:  ?Procedure Laterality Date  ? ABDOMINAL HYSTERECTOMY    ? ANTERIOR CERVICAL DECOMP/DISCECTOMY FUSION N/A 05/24/2020  ? Procedure: ANTERIOR CERVICAL DECOMPRESSION/DISCECTOMY FUSION 1 LEVEL C3-4;  Surgeon: Melina Schools, MD;  Location: [place];  Service: Orthopedics;  Laterality: N/A;  3 hrs  ? BILIARY STENT PLACEMENT N/A 12/12/2015  ? Procedure: BILIARY STENT PLACEMENT;  Surgeon: Doran Stabler, MD;  Location: [place] [facility];  Service: Endoscopy;  Laterality: N/A;  ? BLADDER REPAIR    ? CARPAL TUNNEL RELEASE    ? CERVICAL FUSION    ? CERVICAL SPINE SURGERY    ? CHOLECYSTECTOMY N/A 12/06/2015  ? Procedure: LAPAROSCOPIC CHOLECYSTECTOMY WITH  INTRAOPERATIVE CHOLANGIOGRAM;  Surgeon:  Stark Klein, MD;  Location: [place];  Service: General;  Laterality: N/A;  ? ELBOW SURGERY    ? ERCP N/A 12/12/2015  ? Procedure: ENDOSCOPIC RETROGRADE CHOLANGIOPANCREATOGRAPHY (ERCP);  Surgeon: Doran Stabler, MD;  Location: [facility] ENDOSCOPY;  Service: Endoscopy;  Laterality: N/A;  ? ESOPHAGOGASTRODUODENOSCOPY N/A 01/28/2016  ? Procedure: ESOPHAGOGASTRODUODENOSCOPY (EGD) Biliary STENT removal;  Surgeon: Doran Stabler, MD;  Location: [facility];  Service: Gastroenterology;  Laterality: N/A;  ? IR GENERIC HISTORICAL  12/17/2015  ? IR US GUIDE BX ASP/DRAIN 12/17/2015 [facility]  ? IR GENERIC HISTORICAL  12/17/2015  ? IR GUIDED DRAIN W CATHETER PLACEMENT 12/17/2015 [facility]  ? IR GENERIC HISTORICAL  12/17/2015  ? IR SINUS/FIST TUBE CHK-NON GI 12/17/2015 [facility]  ? KNEE  SURGERY    ? LUNG SURGERY    ? TEE WITHOUT CARDIOVERSION N/A 12/19/2015  ? Procedure: TRANSESOPHAGEAL ECHOCARDIOGRAM (TEE);  Surgeon: Josue Hector, MD;  Location: [place];  Service: Cardiovascular;  Laterality: N/A;  ? ?Past Medical History:  ?Diagnosis Date  ? Anemia   ? Cervical neuritis   ? Chronic kidney disease   ? stage 3  ? Dyslipidemia   ? GERD (gastroesophageal reflux disease)   ? History of transient ischemic attack (TIA)   ? HTN (hypertension)   ? Hyperlipidemia   ? Migraine   ? Migraine headache   ? Occipital neuralgia   ? Left  ? Pulmonary embolism ([place]) 2017  ? Renal insufficiency   ? Stroke [facility]) 10/2013  ? ?BP 137/87   Pulse 73   Ht '5\' 6"'$  (1.676 m)   Wt 185 lb 12.8 oz (84.3 kg)   SpO2 95%   BMI 29.99 kg/m?  ? ?Opioid Risk Score:   ?Fall Risk Score:  `1 ? ?Depression screen PHQ 2/9 ? ? ?  07/23/2021  ? 10:07 AM 06/07/2021  ?  1:28 PM 05/10/2021  ?  4:28 PM 04/19/2021  ? 10:05 AM 01/11/2021  ? 12:48 PM 08/09/2020  ?  1:12 PM 07/24/2020  ?  2:24 PM  ?Depression screen PHQ 2/9  ?Decreased Interest '1 1 2 1 1 1 1  '$ ?Down, Depressed, Hopeless '1 1 2 1 1 1 '$ 0  ?PHQ - 2 Score '2 2 4 2 2 2 1  '$ ?Altered sleeping   2    0  ?Tired, decreased  energy   2    1  ?Change in appetite   2    0  ?Feeling bad or failure about yourself    2    0  ?Trouble concentrating   2    0  ?Moving slowly or fidgety/restless   2    0  ?Suicidal thoughts   0    0  ?PHQ-9 Score

## 2021-07-23 NOTE — Progress Notes (Signed)
Pre procedure reviewed for RF scheduled for 08/30/21. She is on plavix but will not need to stop for this procedure. ?

## 2021-07-24 ENCOUNTER — Telehealth: Payer: Self-pay | Admitting: *Deleted

## 2021-07-24 ENCOUNTER — Telehealth: Payer: Self-pay | Admitting: Neurology

## 2021-07-24 MED ORDER — TRAMADOL HCL 50 MG PO TABS: ORAL_TABLET | ORAL | 0 refills | Status: DC

## 2021-07-24 NOTE — Telephone Encounter (Signed)
[name] called back after visit because her tramadol was not at the pharmacy.  The order that Dr Naaman Plummer sent over [place] to transmit so it did not get to the pharmacy.  Could you send in her tramadol for her? ? ?Sig: TAKE 1 TABLET EVERY 8 HOURS AS NEEDED FOR MODERATE PAIN   ?Sent to pharmacy as: traMADol (ULTRAM) 50 MG tablet   ?Notes to Pharmacy: This request is for a new prescription for a controlled substance as required by Federal/State law.   ?E-Prescribing Status: Transmission to pharmacy failed (07/19/2021 12:27 PM EDT)   ? ?

## 2021-07-24 NOTE — Telephone Encounter (Signed)
LVM for pt to call back to schedule  ?Medicare/Guarantee trust life no auth req  ?

## 2021-07-26 ENCOUNTER — Other Ambulatory Visit: Payer: Self-pay

## 2021-07-26 MED ORDER — PANTOPRAZOLE SODIUM 40 MG PO TBEC: DELAYED_RELEASE_TABLET | ORAL | 1 refills | Status: DC

## 2021-08-06 ENCOUNTER — Ambulatory Visit (INDEPENDENT_AMBULATORY_CARE_PROVIDER_SITE_OTHER): Payer: Medicare Other | Admitting: Pharmacist Clinician (PhC)/ Clinical Pharmacy Specialist

## 2021-08-06 ENCOUNTER — Encounter: Payer: Self-pay | Admitting: Student

## 2021-08-06 ENCOUNTER — Ambulatory Visit (INDEPENDENT_AMBULATORY_CARE_PROVIDER_SITE_OTHER): Payer: Medicare Other | Admitting: Student

## 2021-08-06 VITALS — BP 132/72 | HR 84 | Ht 66.0 in | Wt 183.0 lb

## 2021-08-06 DIAGNOSIS — F419 Anxiety disorder, unspecified: Secondary | ICD-10-CM

## 2021-08-06 DIAGNOSIS — I25118 Atherosclerotic heart disease of native coronary artery with other forms of angina pectoris: Secondary | ICD-10-CM

## 2021-08-06 DIAGNOSIS — I10 Essential (primary) hypertension: Secondary | ICD-10-CM | POA: Diagnosis not present

## 2021-08-06 DIAGNOSIS — Z79899 Other long term (current) drug therapy: Secondary | ICD-10-CM | POA: Diagnosis not present

## 2021-08-06 DIAGNOSIS — E785 Hyperlipidemia, unspecified: Secondary | ICD-10-CM | POA: Diagnosis not present

## 2021-08-06 LAB — COMPREHENSIVE METABOLIC PANEL
ALT: 25 IU/L (ref 0–32)
AST: 23 IU/L (ref 0–40)
Albumin/Globulin Ratio: 2.4 — ABNORMAL HIGH (ref 1.2–2.2)
Albumin: 4.6 g/dL (ref 3.8–4.8)
Alkaline Phosphatase: 122 IU/L — ABNORMAL HIGH (ref 44–121)
BUN/Creatinine Ratio: 12 (ref 12–28)
BUN: 14 mg/dL (ref 8–27)
Bilirubin Total: 0.2 mg/dL (ref 0.0–1.2)
CO2: 26 mmol/L (ref 20–29)
Calcium: 8.9 mg/dL (ref 8.7–10.3)
Chloride: 103 mmol/L (ref 96–106)
Creatinine, Ser: 1.16 mg/dL — ABNORMAL HIGH (ref 0.57–1.00)
Globulin, Total: 1.9 g/dL (ref 1.5–4.5)
Glucose: 116 mg/dL — ABNORMAL HIGH (ref 70–99)
Potassium: 4.1 mmol/L (ref 3.5–5.2)
Sodium: 142 mmol/L (ref 134–144)
Total Protein: 6.5 g/dL (ref 6.0–8.5)
eGFR: 51 mL/min/{1.73_m2} — ABNORMAL LOW (ref >59)

## 2021-08-06 LAB — LIPID PANEL
Chol/HDL Ratio: 2.3 ratio (ref 0.0–4.4)
Cholesterol, Total: 143 mg/dL (ref 100–199)
HDL: 61 mg/dL (ref >39)
LDL Chol Calc (NIH): 59 mg/dL (ref 0–99)
Triglycerides: 133 mg/dL (ref 0–149)
VLDL Cholesterol Cal: 23 mg/dL (ref 5–40)

## 2021-08-06 MED ORDER — SERTRALINE HCL 25 MG PO TABS: 25.0000 mg | ORAL_TABLET | Freq: Every day | ORAL | 1 refills | Status: DC

## 2021-08-06 MED ORDER — PANTOPRAZOLE SODIUM 40 MG PO TBEC: DELAYED_RELEASE_TABLET | ORAL | 1 refills | Status: DC

## 2021-08-06 MED ORDER — SERTRALINE HCL 50 MG PO TABS: ORAL_TABLET | ORAL | 1 refills | Status: DC

## 2021-08-06 NOTE — Patient Instructions (Signed)
Your Results:             Your most recent labs Goal  Total Cholesterol  < 200  Triglycerides  < 150  HDL (happy/good cholesterol)  > 40  LDL (lousy/bad cholesterol  < 70   Medication changes:  We will start the process to get Inclisiran [place]) covered by your insurance.  Once the prior authorization is complete, Grandville Silos will call you to let you know and set up a time to get your first injection  Lab orders:  We want to repeat labs about a month after the second injection.  We will send you a lab order to remind you once we get closer to that time.      Thank you for choosing [facility]

## 2021-08-06 NOTE — Assessment & Plan Note (Signed)
Patient with hyperlipidemia and ASCVD, not al LDL goal on maximally tolerated statin (rosuvastatin 5 mg qd) and ezetimibe.  Reviewed options for lowering LDL cholesterol, including PCSK-9 inhibitors, bempedoic acid and inclisiran.  Discussed mechanisms of action, dosing, side effects and potential decreases in LDL cholesterol.  Also reviewed cost information and potential options for patient assistance.  Answered all patient questions.  Based on this information, patient would prefer to start inclisiran.   She signed insurance investigation form and we will start the process to get coverage for her.  Because her most recent labs are now over 96 years old, will need to wait until we get updated labs later this week.

## 2021-08-06 NOTE — Patient Instructions (Signed)
It was great to see you! Thank you for allowing me to participate in your care!  I recommend that you always bring your medications to each appointment as this makes it easy to ensure you are on the correct medications and helps [place] not miss when refills are needed.  Our plans for today:  - I sent in your 71 yo your pharmacy -We will further discuss lowering your Protonix at your next visit and adding on a medication called Pepcid for GERD  We are checking some labs today, I will call you if they are abnormal will send you a MyChart message or a letter if they are normal.  If you do not hear about your labs in the next 2 weeks please let us know.  Take care and seek immediate care sooner if you develop any concerns.   Dr. Precious Gilding, DO [facility]

## 2021-08-06 NOTE — Progress Notes (Signed)
08/06/2021 [name] 1950/06/24 [id number]   HPI:  [name] is a 71 y.o. female patient of Dr Oval Linsey, who presents today for a lipid clinic evaluation.  See pertinent past medical history below.  She was most recently seen by Laurann Montana for follow up.  She was feeling well overall at that visit, and had not been on any lipid lowering medications for some time.  She had previously noted myalgias with rosuvastatin 10 mg, but seemed to tolerate the 5 mg dose.   She is in the office today to discuss options for further LDL reduction.     Past Medical History: hypertension Controlled on amlodipine  ASCVD Left subclavian artery stenosis, CAD noted on chest CT 5(21  CVA 10/2013  CKD 3/23 SCr 1.19, CrCl    Current Medications: rosuvastatin 5 mg daily, ezetimibe 10 mg daily  Cholesterol Goals: LDL < 70   Intolerant/previously tried: praluent made her feel weird, myalgias  Family history: mother's heart was "terrible" - lung issues died at 8; brother died from MI at 47, other brother valve issues, damage; no knowledge of father other than lost both legs to DM: son healthy so far now 76  Diet: mostly eat at home, salads; eats hamburgers or [employer] on Fridays with grandson;   Exercise:  walks most days, 20-30 minutes most days  Labs:  4/21:  TC 210, TG 224, HDL 48, LDL 123   Current Outpatient Medications  Medication Sig Dispense Refill   albuterol (VENTOLIN HFA) 108 (90 Base) MCG/ACT inhaler Inhale 2 puffs into the lungs every 6 (six) hours as needed for wheezing or shortness of breath. 8 g 6   amLODipine (NORVASC) 5 MG tablet Take 1 tablet (5 mg total) by mouth daily. 90 tablet 3   cetirizine (ZYRTEC) 10 MG tablet Take 10 mg by mouth daily with breakfast.     clopidogrel (PLAVIX) 75 MG tablet Take 1 tablet (75 mg total) by mouth daily. 90 tablet 3   doxepin (SINEQUAN) 100 MG capsule Take 2 capsules (200 mg total) by mouth at bedtime. 180 capsule 3   ezetimibe (ZETIA) 10 MG  tablet Take 1 tablet (10 mg total) by mouth daily. 90 tablet 3   fluticasone (FLONASE) 50 MCG/ACT nasal spray Place 1 spray into both nostrils daily. (Patient taking differently: Place 1 spray into both nostrils daily as needed for allergies.)  2   furosemide (LASIX) 40 MG tablet Take 40 mg by mouth daily.     gabapentin (NEURONTIN) 600 MG tablet TAKE 1 TABLET(600 MG) BY MOUTH TWICE DAILY 60 tablet 11   ondansetron (ZOFRAN) 4 MG tablet Take 1 tablet (4 mg total) by mouth every 8 (eight) hours as needed for nausea or vomiting. 20 tablet 0   pantoprazole (PROTONIX) 40 MG tablet TAKE 1 TABLET(40 MG) BY MOUTH TWICE DAILY 90 tablet 1   Rimegepant Sulfate (NURTEC) 75 MG TBDP Take 75 mg by mouth daily as needed (take for abortive therapy of migraine, no more than 1 tablet in 24 hours or 10 per month). 8 tablet 11   rosuvastatin (CRESTOR) 5 MG tablet Take 1 tablet (5 mg total) by mouth daily. 90 tablet 3   sertraline (ZOLOFT) 50 MG tablet TAKE 1 TABLET(50 MG) BY MOUTH DAILY 60 tablet 1   tiZANidine (ZANAFLEX) 4 MG tablet Take 1 tablet (4 mg total) by mouth every 6 (six) hours as needed for muscle spasms. 60 tablet 2   traMADol (ULTRAM) 50 MG tablet TAKE 1 TABLET  EVERY 8 HOURS AS NEEDED FOR MODERATE PAIN 90 tablet 0   traZODone (DESYREL) 50 MG tablet TAKE 1/2 TO 1 TABLET(25 TO 50 MG) BY MOUTH AT BEDTIME AS NEEDED FOR SLEEP 90 tablet 0   zonisamide (ZONEGRAN) 100 MG capsule Take 1 capsule (100 mg total) by mouth 2 (two) times daily. 180 capsule 3   zonisamide (ZONEGRAN) 25 MG capsule take 25 mg twice daily prn in addition to 100 mg twice daily. 180 capsule 3   No current facility-administered medications for this visit.    Allergies  Allergen Reactions   Iodinated Contrast Media Shortness Of Breath and Itching    States she had this reaction connected with a MRI of the cervical spine.   Shellfish Allergy Anaphylaxis    All seafood   Crestor [Rosuvastatin]     Anxiety, itching, headaches, myalgias    Iodine    Lipitor [Atorvastatin]     Anxiety, itching, headaches, myalgias   Statins Other (See Comments)    Arthralgias (severe) with atorvastatin, mild arthralgias with rosuvastatin but willing to continue taking it    Past Medical History:  Diagnosis Date   Anemia    Cervical neuritis    Chronic kidney disease    stage 3   Dyslipidemia    GERD (gastroesophageal reflux disease)    History of transient ischemic attack (TIA)    HTN (hypertension)    Hyperlipidemia    Migraine    Migraine headache    Occipital neuralgia    Left   Pulmonary embolism ([place]) 2017   Renal insufficiency    Stroke ([place]) 10/2013    Blood pressure 136/82, pulse 91, resp. rate 15, height '5\' 6"'$  (1.676 m), weight 186 lb (84.4 kg), SpO2 96 %.   Hyperlipidemia LDL goal <70 Patient with hyperlipidemia and ASCVD, not al LDL goal on maximally tolerated statin (rosuvastatin 5 mg qd) and ezetimibe.  Reviewed options for lowering LDL cholesterol, including PCSK-9 inhibitors, bempedoic acid and inclisiran.  Discussed mechanisms of action, dosing, side effects and potential decreases in LDL cholesterol.  Also reviewed cost information and potential options for patient assistance.  Answered all patient questions.  Based on this information, patient would prefer to start inclisiran.   She signed insurance investigation form and we will start the process to get coverage for her.  Because her most recent labs are now over 28 years old, will need to wait until we get updated labs later this week.   Tommy Medal PharmD CPP [facility] [address] [place], [place] [address] [phone]

## 2021-08-06 NOTE — Progress Notes (Unsigned)
    SUBJECTIVE:   CHIEF COMPLAINT / HPI:   HTN Patient currently takes amlodipine 5 mg for hypertension.  She has previously tried going up to 10 mg of amlodipine but stated this caused leg weakness and she did not tolerate it.  Blood pressure today is 160/92 and will repeat before she leaves and consider an additional agent.  On chart review patient has had some elevated and some normal blood pressures over the past couple months.  Anxiety At last visit, patient was told not to take her 25 mg Zoloft but to continue taking the 50 mg Zoloft daily.  She had previously been told to take the 25 mg Zoloft just as needed at bedtime but stated it did not help.  Patient states today that she has been taking both the 25 and 50 at the same time for total of 75 mg daily and that it has helped with her anxiety and she would like to continue taking the 75 mg daily.  Health maintenance  At last visit, shingrix prescription sent to pharmacy, planned to get COVID booster at her pharmacy and cologaurd ordered. She has not yet received the vaccines but did complete the cologaurd.   PERTINENT  PMH / PSH: HTN, PVD, coronary artery calcification   OBJECTIVE:   Vitals:   08/06/21 1354 08/06/21 1436  BP: (!) 160/92 132/72  Pulse: 84   SpO2: 98%      General: NAD, pleasant, able to participate in exam Cardiac: RRR, no murmurs. Respiratory: CTAB, normal effort, No wheezes, rales or rhonchi Extremities: Trace bilateral pitting edema of BLEs Skin: warm and dry Neuro: alert, no obvious focal deficits Psych: Normal affect and mood  ASSESSMENT/PLAN:   Essential hypertension Repeat blood pressure is 132/72.  We decided not to add an additional agent at this visit and will continue to monitor her blood pressure. -Amlodipine 5 mg daily -Return for follow-up in 3 months  Anxiety I have prescribed both 50 mg and 25 mg all for patient to take a total of 75 mg Zoloft daily as this is helped her anxiety.  The  50 mg Zoloft cannot not be easily cut in half per pt for her to only be prescribed 1 dosage.     Dr. Precious Gilding, [place]

## 2021-08-07 ENCOUNTER — Telehealth: Payer: Self-pay

## 2021-08-07 LAB — COMPREHENSIVE METABOLIC PANEL
ALT: 23 IU/L (ref 0–32)
AST: 25 IU/L (ref 0–40)
Albumin/Globulin Ratio: 1.9 (ref 1.2–2.2)
Albumin: 4.4 g/dL (ref 3.8–4.8)
Alkaline Phosphatase: 118 IU/L (ref 44–121)
BUN/Creatinine Ratio: 12 (ref 12–28)
BUN: 15 mg/dL (ref 8–27)
Bilirubin Total: 0.3 mg/dL (ref 0.0–1.2)
CO2: 25 mmol/L (ref 20–29)
Calcium: 9.1 mg/dL (ref 8.7–10.3)
Chloride: 102 mmol/L (ref 96–106)
Creatinine, Ser: 1.21 mg/dL — ABNORMAL HIGH (ref 0.57–1.00)
Globulin, Total: 2.3 g/dL (ref 1.5–4.5)
Glucose: 99 mg/dL (ref 70–99)
Potassium: 3.9 mmol/L (ref 3.5–5.2)
Sodium: 143 mmol/L (ref 134–144)
Total Protein: 6.7 g/dL (ref 6.0–8.5)
eGFR: 49 mL/min/{1.73_m2} — ABNORMAL LOW (ref >59)

## 2021-08-07 NOTE — Assessment & Plan Note (Signed)
Repeat blood pressure is 132/72.  We decided not to add an additional agent at this visit and will continue to monitor her blood pressure. -Amlodipine 5 mg daily -Return for follow-up in 3 months

## 2021-08-07 NOTE — Assessment & Plan Note (Signed)
I have prescribed both 50 mg and 25 mg all for patient to take a total of 75 mg Zoloft daily as this is helped her anxiety.  The 50 mg Zoloft cannot not be easily cut in half per pt for her to only be prescribed 1 dosage.

## 2021-08-07 NOTE — Telephone Encounter (Addendum)
Your request for tramadol has been approved. Pharmacy and patient aware

## 2021-08-07 NOTE — Telephone Encounter (Signed)
Spoke to the patient she is scheduled at [facility] for 08/12/21 at 8 pm.

## 2021-08-07 NOTE — Telephone Encounter (Signed)
PA for Tramadol sent to insurance through CoverMyMeds 

## 2021-08-12 NOTE — Telephone Encounter (Signed)
I spoke with the patient she stated she does not want to do the NSPG only the HST. She is scheduled for Wednesday 08/14/21 to pick up at 9:30 AM. I verified her email address with her again and sent her a updated email about her appointment information

## 2021-08-12 NOTE — Telephone Encounter (Signed)
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

## 2021-08-13 ENCOUNTER — Encounter: Payer: Self-pay | Admitting: *Deleted

## 2021-08-14 ENCOUNTER — Ambulatory Visit (INDEPENDENT_AMBULATORY_CARE_PROVIDER_SITE_OTHER): Payer: Medicare Other | Admitting: Neurology

## 2021-08-14 DIAGNOSIS — Z820 Family history of epilepsy and other diseases of the nervous system: Secondary | ICD-10-CM

## 2021-08-14 DIAGNOSIS — E669 Obesity, unspecified: Secondary | ICD-10-CM

## 2021-08-14 DIAGNOSIS — R351 Nocturia: Secondary | ICD-10-CM

## 2021-08-14 DIAGNOSIS — R519 Headache, unspecified: Secondary | ICD-10-CM

## 2021-08-14 DIAGNOSIS — G4733 Obstructive sleep apnea (adult) (pediatric): Secondary | ICD-10-CM | POA: Diagnosis not present

## 2021-08-14 DIAGNOSIS — R0683 Snoring: Secondary | ICD-10-CM

## 2021-08-14 DIAGNOSIS — R635 Abnormal weight gain: Secondary | ICD-10-CM

## 2021-08-21 NOTE — Progress Notes (Signed)
See procedure note.

## 2021-08-22 NOTE — Procedures (Signed)
   [facility]  HOME SLEEP TEST (Watch PAT) REPORT  STUDY DATE: 08/14/2021  DOB: 09-24-1950  MRN: [id number]  ORDERING CLINICIAN: Star Age, MD, PhD   REFERRING CLINICIAN: Debbora Presto, NP  CLINICAL INFORMATION/HISTORY: 71 year old right-handed woman with an underlying medical history of anemia, chronic kidney disease, hyperlipidemia, reflux disease, hypertension, migraine headaches, neck pain, history of pulmonary embolism, history of stroke, and obesity, who reports some snoring and excessive daytime somnolence as well as morning headaches and a family history of sleep apnea.   Epworth sleepiness score: 9/24.  BMI: 30.1 kg/m  FINDINGS:   Sleep Summary:   Total Recording Time (hours, min): 9 hours, 17 minutes  Total Sleep Time (hours, min):  8 hours, 11 minutes   Percent REM (%):    36.6%   Respiratory Indices:   Calculated pAHI (per hour):  27.6/hour         REM pAHI:    28.1/hour       NREM pAHI: 27.3/hour  Oxygen Saturation Statistics:    Oxygen Saturation (%) Mean: 92%   Minimum oxygen saturation (%):                 86%   O2 Saturation Range (%): 86-98%    O2 Saturation (minutes) <=88%: 0.9 min  Pulse Rate Statistics:   Pulse Mean (bpm):    73/min    Pulse Range (62-97/min)   IMPRESSION: OSA (obstructive sleep apnea)   RECOMMENDATION:  This home sleep test demonstrates moderate obstructive sleep apnea with a total AHI of 27.6/hour and O2 nadir of 86%.  Intermittent mild to moderate snoring was detected, at times in the louder range.  Treatment with positive airway pressure is recommended. The patient will be advised to proceed with an autoPAP titration/trial at home for now. A full night titration study may be considered to optimize treatment settings, if needed down the road.  Alternative treatment options may include a dental device in selected patients or surgical treatment with inspire, hypoglossal nerve stimulator in selected  patients. Please note that untreated obstructive sleep apnea may carry additional perioperative morbidity. Patients with significant obstructive sleep apnea should receive perioperative PAP therapy and the surgeons and particularly the anesthesiologist should be informed of the diagnosis and the severity of the sleep disordered breathing. The patient should be cautioned not to drive, work at heights, or operate dangerous or heavy equipment when tired or sleepy. Review and reiteration of good sleep hygiene measures should be pursued with any patient. Other causes of the patient's symptoms, including circadian rhythm disturbances, an underlying mood disorder, medication effect and/or an underlying medical problem cannot be ruled out based on this test. Clinical correlation is recommended. The patient and her referring provider will be notified of the test results. The patient will be seen in follow up in sleep clinic at [place].  I certify that I have reviewed the raw data recording prior to the issuance of this report in accordance with the standards of the American Academy of Sleep Medicine (AASM).  INTERPRETING PHYSICIAN:   Star Age, MD, PhD  Board Certified in Neurology and Sleep Medicine  [facility] [address], [place], [place] [address] [phone]

## 2021-08-22 NOTE — Addendum Note (Signed)
Addended by: Star Age on: 08/22/2021 05:04 PM   Modules accepted: Orders

## 2021-08-26 ENCOUNTER — Telehealth: Payer: Self-pay | Admitting: *Deleted

## 2021-08-26 NOTE — Telephone Encounter (Signed)
Called patient back unable to leave VM . VM is full will call later

## 2021-08-26 NOTE — Telephone Encounter (Signed)
-----   Message from Star Age, MD sent at 08/22/2021  5:04 PM EDT ----- Patient referred by AL, seen by me on 05/27/21, patient had a HST on 08/14/21.    Please call and notify the patient that the recent home sleep test showed obstructive sleep apnea in the moderate range. I recommend treatment in the form of autoPAP, which means, that we don't have to bring her in for a sleep study with CPAP, but will let her start using a so called autoPAP machine at home, which is a CPAP-like machine with self-adjusting pressures. We will send the order to a local DME company (of her choice, or as per insurance requirement). The DME representative will fit her with a mask, educate her on how to use the machine, how to put the mask on, etc. I have placed an order in the chart. Please send the order, talk to patient, send report to referring MD. We will need a FU in sleep clinic for 10 weeks post-PAP set up, please arrange that with me or one of our NPs. Also reinforce the need for compliance with treatment. Thanks,   Star Age, MD, PhD [facility])

## 2021-08-26 NOTE — Telephone Encounter (Signed)
Called pt back VM full will mychart patient for her to call me back for sleep study results

## 2021-08-26 NOTE — Telephone Encounter (Signed)
-----   Message from Star Age, MD sent at 08/22/2021  5:04 PM EDT ----- Patient referred by AL, seen by me on 05/27/21, patient had a HST on 08/14/21.    Please call and notify the patient that the recent home sleep test showed obstructive sleep apnea in the moderate range. I recommend treatment in the form of autoPAP, which means, that we don't have to bring her in for a sleep study with CPAP, but will let her start using a so called autoPAP machine at home, which is a CPAP-like machine with self-adjusting pressures. We will send the order to a local DME company (of her choice, or as per insurance requirement). The DME representative will fit her with a mask, educate her on how to use the machine, how to put the mask on, etc. I have placed an order in the chart. Please send the order, talk to patient, send report to referring MD. We will need a FU in sleep clinic for 10 weeks post-PAP set up, please arrange that with me or one of our NPs. Also reinforce the need for compliance with treatment. Thanks,   Star Age, MD, PhD [facility])

## 2021-08-27 NOTE — Telephone Encounter (Addendum)
Spoke to patient gave sleep study results . Patient chose [facility] for DME  Informed patient of importance of compliance. Informed patient that she needs to wear CPAP 4+hours everynight for her insurance to help pay for CPAP machine and supplies . Made initial CPAP follow up Appointment for 11/19/2021. Pt expressed understanding . Did inform patient to make sure she brings CPAP machine and powercord with her for initial visit . Per Dr Rexene Alberts will forward sleep results to Referring provider Did Fax over orders to [place] this afternoon

## 2021-08-30 ENCOUNTER — Encounter: Payer: Self-pay | Admitting: Physical Medicine & Rehabilitation

## 2021-08-30 ENCOUNTER — Encounter: Payer: Medicare Other | Attending: Physical Medicine & Rehabilitation | Admitting: Physical Medicine & Rehabilitation

## 2021-08-30 VITALS — BP 131/82 | HR 85 | Temp 98.4°F | Ht 66.0 in | Wt 164.0 lb

## 2021-08-30 DIAGNOSIS — G8929 Other chronic pain: Secondary | ICD-10-CM | POA: Diagnosis not present

## 2021-08-30 DIAGNOSIS — M533 Sacrococcygeal disorders, not elsewhere classified: Secondary | ICD-10-CM | POA: Insufficient documentation

## 2021-08-30 NOTE — Progress Notes (Signed)
Sacroiliac radio frequency ablation under fluoroscopic guidance This consists of LEFT L5 dorsal ramus radio frequency ablation plus RF of  LEFT S1-S2 -S3 lateral branches  Indication is sacroiliac pain which has improved temporarily onby at least 50% following sacroiliac intra-articular injection and L5 , S1,2,3 Lateral branch blocks under fluoroscopic guidance. Pain interferes with self-care and mobility and has failed to respond to conservative measures.  Informed consent was obtained after discussing risks and benefits of the procedure with the patient these include bleeding bruising and infection temporary or permanent paralysis. The patient elects to proceed and has given written consent.  Patient placed prone on fluoroscopy table. Area marked and prepped with Betadine. Fluoroscopic images utilized to guide needle. 25-gauge 1.5 inch needle was used to anesthetize 4 injection points with 2 cc of 1% lidocaine each. Then a 18-gauge 10 cm RF needle with a 10 mm curved active tip was inserted under fluoroscopic guidance first targeting the S1 SA P./sacral ala junction, bone contact made and confirmed with lateral imaging.  motor stimulation at 2 Hz confirm proper needle location followed by injection of one cc of a solution containing   2% lidocaine MPF. Radio frequency 80C for 80 seconds was performed. Then the inferolateral aspect of the S1, S2 and lateral aspect of S3 sacral foramina were targeted. Bone contact made.  motor stim at 2 Hz confirm proper needle location. One ML of the /lidocaine solution was injected into each of 3 sites and radio frequency ablation 80C for 90 seconds was performed. Patient tolerated procedure well. Post procedure instructions given  

## 2021-09-12 ENCOUNTER — Other Ambulatory Visit: Payer: Self-pay | Admitting: Physical Medicine & Rehabilitation

## 2021-09-24 ENCOUNTER — Encounter: Payer: Self-pay | Admitting: Pharmacist Clinician (PhC)/ Clinical Pharmacy Specialist

## 2021-09-27 ENCOUNTER — Ambulatory Visit (INDEPENDENT_AMBULATORY_CARE_PROVIDER_SITE_OTHER): Payer: Medicare Other | Admitting: Family

## 2021-09-27 ENCOUNTER — Encounter (HOSPITAL_BASED_OUTPATIENT_CLINIC_OR_DEPARTMENT_OTHER): Payer: Self-pay | Admitting: Family

## 2021-09-27 VITALS — BP 120/82 | HR 74 | Ht 66.0 in | Wt 187.0 lb

## 2021-09-27 DIAGNOSIS — I25118 Atherosclerotic heart disease of native coronary artery with other forms of angina pectoris: Secondary | ICD-10-CM

## 2021-09-27 DIAGNOSIS — G4733 Obstructive sleep apnea (adult) (pediatric): Secondary | ICD-10-CM

## 2021-09-27 DIAGNOSIS — E785 Hyperlipidemia, unspecified: Secondary | ICD-10-CM | POA: Diagnosis not present

## 2021-09-27 DIAGNOSIS — I10 Essential (primary) hypertension: Secondary | ICD-10-CM

## 2021-09-27 NOTE — Patient Instructions (Signed)
Medication Instructions:  Continue your current medications.   *If you need a refill on your cardiac medications before your next appointment, please call your pharmacy*   Lab Work: None ordered today.   Testing/Procedures: None ordered today.    Follow-Up: At [facility], you and your health needs are our priority.  As part of our continuing mission to provide you with exceptional heart care, we have created designated Provider Care Teams.  These Care Teams include your primary Cardiologist (physician) and Advanced Practice Providers (APPs -  Physician Assistants and Nurse Practitioners) who all work together to provide you with the care you need, when you need it.  We recommend signing up for the patient portal called "MyChart".  Sign up information is provided on this After Visit Summary.  MyChart is used to connect with patients for Virtual Visits (Telemedicine).  Patients are able to view lab/test results, encounter notes, upcoming appointments, etc.  Non-urgent messages can be sent to your provider as well.   To learn more about what you can do with MyChart, go to [email or web address].    Your next appointment:   6 month(s)  The format for your next appointment:   In Person  Provider:   Skeet Latch, MD or Laurann Montana, NP    Other Instructions  Heart Healthy Diet Recommendations: A low-salt diet is recommended. Meats should be grilled, baked, or boiled. Avoid fried foods. Focus on lean protein sources like fish or chicken with vegetables and fruits. The American Heart Association is a Microbiologist!  American Heart Association Diet and Lifeystyle Recommendations   Exercise recommendations: The American Heart Association recommends 150 minutes of moderate intensity exercise weekly. Try 30 minutes of moderate intensity exercise 4-5 times per week. This could include walking, jogging, or swimming. If you decide you want to participate in the PREP exercise  program, simply call or send a MyChart message.  Important Information About Sugar

## 2021-09-27 NOTE — Progress Notes (Signed)
Office Visit    Patient Name: [name] Date of Encounter: 09/27/2021  PCP:  Precious Gilding, [facility]  Cardiologist:  Skeet Latch, MD  Advanced Practice Provider:  No care team member to display Electrophysiologist:  None      Chief Complaint    [name] is a 71 y.o. female with a hx of HTN, coronary artery calcification, HLD, CKD III, CVA, left subclavian artery stenosis, prior tobacco use presents today for follow-up of hypertension, hyperlipidemia  Past Medical History    Past Medical History:  Diagnosis Date   Anemia    Cervical neuritis    Chronic kidney disease    stage 3   Dyslipidemia    GERD (gastroesophageal reflux disease)    History of transient ischemic attack (TIA)    HTN (hypertension)    Hyperlipidemia    Migraine    Migraine headache    Occipital neuralgia    Left   Pulmonary embolism ([place]) 2017   Renal insufficiency    Stroke ([place]) 10/2013   Past Surgical History:  Procedure Laterality Date   ABDOMINAL HYSTERECTOMY     ANTERIOR CERVICAL DECOMP/DISCECTOMY FUSION N/A 05/24/2020   Procedure: ANTERIOR CERVICAL DECOMPRESSION/DISCECTOMY FUSION 1 LEVEL C3-4;  Surgeon: Melina Schools, MD;  Location: [place];  Service: Orthopedics;  Laterality: N/A;  3 hrs   BILIARY STENT PLACEMENT N/A 12/12/2015   Procedure: BILIARY STENT PLACEMENT;  Surgeon: Doran Stabler, MD;  Location: [place];  Service: Endoscopy;  Laterality: N/A;   BLADDER REPAIR     CARPAL TUNNEL RELEASE     CERVICAL FUSION     CERVICAL SPINE SURGERY     CHOLECYSTECTOMY N/A 12/06/2015   Procedure: LAPAROSCOPIC CHOLECYSTECTOMY WITH  INTRAOPERATIVE CHOLANGIOGRAM;  Surgeon: Stark Klein, MD;  Location: [place];  Service: General;  Laterality: N/A;   ELBOW SURGERY     ERCP N/A 12/12/2015   Procedure: ENDOSCOPIC RETROGRADE CHOLANGIOPANCREATOGRAPHY (ERCP);  Surgeon: Doran Stabler, MD;  Location: [facility] ENDOSCOPY;  Service: Endoscopy;  Laterality:  N/A;   ESOPHAGOGASTRODUODENOSCOPY N/A 01/28/2016   Procedure: ESOPHAGOGASTRODUODENOSCOPY (EGD) Biliary STENT removal;  Surgeon: Doran Stabler, MD;  Location: [facility] ENDOSCOPY;  Service: Gastroenterology;  Laterality: N/A;   IR GENERIC HISTORICAL  12/17/2015   IR US GUIDE BX ASP/DRAIN 12/17/2015 [facility]INTER[facility]   IR GENERIC HISTORICAL  12/17/2015   IR GUIDED DRAIN W CATHETER PLACEMENT 12/17/2015 [facility]   IR GENERIC HISTORICAL  12/17/2015   IR SINUS/FIST TUBE CHK-NON GI 12/17/2015 [facility]   KNEE SURGERY     LUNG SURGERY     TEE WITHOUT CARDIOVERSION N/A 12/19/2015   Procedure: TRANSESOPHAGEAL ECHOCARDIOGRAM (TEE);  Surgeon: Josue Hector, MD;  Location: [facility]COPY;  Service: Cardiovascular;  Laterality: N/A;    Allergies  Allergies  Allergen Reactions   Iodinated Contrast Media Shortness Of Breath and Itching    States she had this reaction connected with a MRI of the cervical spine.   Shellfish Allergy Anaphylaxis    All seafood   Crestor [Rosuvastatin]     Anxiety, itching, headaches, myalgias   Iodine    Lipitor [Atorvastatin]     Anxiety, itching, headaches, myalgias   Statins Other (See Comments)    Arthralgias (severe) with atorvastatin, mild arthralgias with rosuvastatin but willing to continue taking it    History of Present Illness    [name] is a 71 y.o. female with a hx of HTN,  coronary artery calcification by CT, HLD, CKD III, CVA, left subclavian artery stenosis, prior tobacco use last seen 08/06/2021  She was admitted May 2019 with shortness of breath.  Lower extremity Doppler negative for DVT.  VQ scan showed perfusion defect deemed intermittent probability.  She was treated with Eliquis for 6 months.  Myoview July 2019 EF 66%, no ischemia.  Repeat Myoview October 2021 EF 66%, apical anterior and apical defect due to breast attenuation.  Referred to pulmonology and felt to be related to deconditioning and reactive airway disease, treated with Breo and  albuterol.  When seen 03/16/2020 by Dr. Oval Linsey amlodipine was increased to 5 mg due to hypertension.  When seen 05/22/2020 she was provided clearance for anterior cervical vasectomy and fusion of C3-4  At last clinic visit 06/28/2021 she noted difficulty with statins.  She was referred to lipid clinic.  She was started on Incliseron. Since last seen she had sleep study 08/2021 with moderate OSA recommended for CPAP.   She presents today for follow up independently. Has been doing well. 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 Reports no shortness of breath and that her dyspnea on exertion has improved. Reports no chest pain, pressure, or tightness. No edema, orthopnea, PND. Reports no palpitations.    EKGs/Labs/Other Studies Reviewed:   The following studies were reviewed today:  EKG:  No EKG today.   Recent Labs: 08/06/2021: ALT 23; BUN 15; Creatinine, Ser 1.21; Potassium 3.9; Sodium 143  Recent Lipid Panel    Component Value Date/Time   CHOL 143 08/06/2021 0842   TRIG 133 08/06/2021 0842   HDL 61 08/06/2021 0842   CHOLHDL 2.3 08/06/2021 0842   CHOLHDL 2.2 07/13/2015 1056   VLDL 10 07/13/2015 1056   LDLCALC 59 08/06/2021 0842   LDLDIRECT 156 (H) 03/27/2017 0939    Home Medications   Current Meds  Medication Sig   albuterol (VENTOLIN HFA) 108 (90 Base) MCG/ACT inhaler Inhale 2 puffs into the lungs every 6 (six) hours as needed for wheezing or shortness of breath.   amLODipine (NORVASC) 5 MG tablet Take 1 tablet (5 mg total) by mouth daily.   cetirizine (ZYRTEC) 10 MG tablet Take 10 mg by mouth daily with breakfast.   clopidogrel (PLAVIX) 75 MG tablet Take 1 tablet (75 mg total) by mouth daily.   doxepin (SINEQUAN) 100 MG capsule Take 2 capsules (200 mg total) by mouth at bedtime.   ezetimibe (ZETIA) 10 MG tablet Take 1 tablet (10 mg total) by mouth daily.   fluticasone (FLONASE) 50 MCG/ACT nasal spray Place 1 spray into both nostrils daily.  (Patient taking differently: Place 1 spray into both nostrils daily as needed for allergies.)   furosemide (LASIX) 40 MG tablet Take 40 mg by mouth daily.   gabapentin (NEURONTIN) 600 MG tablet TAKE 1 TABLET(600 MG) BY MOUTH TWICE DAILY   ondansetron (ZOFRAN) 4 MG tablet Take 1 tablet (4 mg total) by mouth every 8 (eight) hours as needed for nausea or vomiting.   pantoprazole (PROTONIX) 40 MG tablet TAKE 1 TABLET(40 MG) BY MOUTH DAILY   Rimegepant Sulfate (NURTEC) 75 MG TBDP Take 75 mg by mouth daily as needed (take for abortive therapy of migraine, no more than 1 tablet in 24 hours or 10 per month).   rosuvastatin (CRESTOR) 5 MG tablet Take 1 tablet (5 mg total) by mouth daily.   sertraline (ZOLOFT) 25 MG tablet Take 1 tablet (25 mg total) by mouth  daily.   sertraline (ZOLOFT) 50 MG tablet TAKE 1 TABLET(50 MG) BY MOUTH DAILY   tiZANidine (ZANAFLEX) 4 MG tablet Take 1 tablet (4 mg total) by mouth every 6 (six) hours as needed for muscle spasms.   traMADol (ULTRAM) 50 MG tablet TAKE 1 TABLET EVERY 8 HOURS AS NEEDED FOR MODERATE PAIN   traZODone (DESYREL) 50 MG tablet TAKE 1/2 TO 1 TABLET(25 TO 50 MG) BY MOUTH AT BEDTIME AS NEEDED FOR SLEEP   zonisamide (ZONEGRAN) 100 MG capsule Take 1 capsule (100 mg total) by mouth 2 (two) times daily.   zonisamide (ZONEGRAN) 25 MG capsule take 25 mg twice daily prn in addition to 100 mg twice daily.     Review of Systems      All other systems reviewed and are otherwise negative except as noted above.  Physical Exam    VS:  BP 120/82 (BP Location: Right Arm, Patient Position: Sitting, Cuff Size: Large)   Pulse 74   Ht '5\' 6"'$  (1.676 m)   Wt 187 lb (84.8 kg)   SpO2 94%   BMI 30.18 kg/m  , BMI Body mass index is 30.18 kg/m.  Wt Readings from Last 3 Encounters:  09/27/21 187 lb (84.8 kg)  08/30/21 164 lb (74.4 kg)  08/06/21 183 lb (83 kg)    GEN: Well nourished, well developed, in no acute distress. HEENT: normal. Neck: Supple, no JVD, carotid  bruits, or masses. Cardiac: RRR, no murmurs, rubs, or gallops. No clubbing, cyanosis, edema.  Radials/PT 2+ and equal bilaterally.  Respiratory:  Respirations regular and unlabored, clear to auscultation bilaterally. GI: Soft, nontender, nondistended. MS: No deformity or atrophy. Skin: Warm and dry, no rash. Neuro:  Strength and sensation are intact. Psych: Normal affect.  Assessment & Plan    HTN - BP well controlled. Continue current antihypertensive regimen of Amlodipine '5mg'$  QD, Lasix '40mg'$  QD. Heart healthy diet and regular cardiovascular exercise encouraged.     Coronary artery calcification / HLD - Stable with no anginal symptoms. No indication for ischemic evaluation.  GDMT includes Plavix, Zetia, Crestor. Previously intolerant to 10 mg Crestor, Praluent, atorvastatin. Has been started on Incliseron by primary care.  CKD III - Careful titration of diuretic and antihypertensive.  Obesity - Weight loss via diet and exercise encouraged. Discussed the impact being overweight would have on cardiovascular risk. Not interested in PREP at this time but she will contact us if she changes her mind.     OSA - New dx 08/2021. Awaiting delivery of CPAP. Managed by neurology.   Disposition: Follow up in 6 months with Skeet Latch, MD or APP.  Signed, Loel Dubonnet, NP 09/27/2021, 8:17 AM [place]

## 2021-10-04 ENCOUNTER — Other Ambulatory Visit: Payer: Self-pay | Admitting: Pharmacist Clinician (PhC)/ Clinical Pharmacy Specialist

## 2021-10-04 ENCOUNTER — Other Ambulatory Visit: Payer: Self-pay | Admitting: Student

## 2021-10-04 DIAGNOSIS — E785 Hyperlipidemia, unspecified: Secondary | ICD-10-CM

## 2021-10-04 MED ORDER — LEQVIO 284 MG/1.5ML ~~LOC~~ SOSY: PREFILLED_SYRINGE | SUBCUTANEOUS | 0 refills | Status: AC

## 2021-10-07 ENCOUNTER — Other Ambulatory Visit: Payer: Self-pay | Admitting: Student

## 2021-10-07 ENCOUNTER — Ambulatory Visit: Payer: Medicare Other | Admitting: Student

## 2021-10-07 MED ORDER — TRAZODONE HCL 50 MG PO TABS: ORAL_TABLET | ORAL | 0 refills | Status: DC

## 2021-10-10 NOTE — Patient Instructions (Signed)
Below is our plan:  We will continue current treatment plan   Please continue using your CPAP regularly. While your insurance requires that you use CPAP at least 4 hours each night on 70% of the nights, I recommend, that you not skip any nights and use it throughout the night if you can. Getting used to CPAP and staying with the treatment long term does take time and patience and discipline. Untreated obstructive sleep apnea when it is moderate to severe can have an adverse impact on cardiovascular health and raise her risk for heart disease, arrhythmias, hypertension, congestive heart failure, stroke and diabetes. Untreated obstructive sleep apnea causes sleep disruption, nonrestorative sleep, and sleep deprivation. This can have an impact on your day to day functioning and cause daytime sleepiness and impairment of cognitive function, memory loss, mood disturbance, and problems focussing. Using CPAP regularly can improve these symptoms.  Please make sure you are staying well hydrated. I recommend 50-60 ounces daily. Well balanced diet and regular exercise encouraged. Consistent sleep schedule with 6-8 hours recommended.   Please continue follow up with care team as directed.   Follow up with me in 1 month  You may receive a survey regarding today's visit. I encourage you to leave honest feed back as I do use this information to improve patient care. Thank you for seeing me today!

## 2021-10-10 NOTE — Progress Notes (Signed)
PATIENT: [name] DOB: [birth year]  REASON FOR VISIT: follow up HISTORY FROM: patient  Chief Complaint  Patient presents with   Follow-up    Rm 1, alone. Here for 4 month migraine f/u. Pt reports about 2 migraines a week.      HISTORY OF PRESENT ILLNESS:  10/14/2021 ALL: [name] returns for follow up for migraines and recently diagnosed OSA. She was last seen 06/2021 with worsening headaches as she had been off medications for 2 weeks. She resumed zonisamide '100mg'$  BID with extra '25mg'$  dose as needed. She also takes doxepin '200mg'$  at bedtime for insomnia. PCP also writes 25-'50mg'$  trazodone for insomnia but she has not started this medication. Migraines are somewhat improved. She reports having 2 migraines a week. Nurtec does help with abortive therapy.   HST showed moderate OSA with total AHI of 27.6/hr and O2 nadir of 86%. AutoPAP ordered. She reports that she has been in contact with [facility] but not yet received her machine.    She is requesting to be excused from jury duty due tot he severity of her migraines. Juror #[id number]  06/27/2021 ALL: [name] returns for follow up for migraines. She continues zonisamide '100mg'$  BID with extra '25mg'$  dose as needed. She also takes doxepin '200mg'$  at bedtime for insomnia. PCP also writes 25-'50mg'$  trazodone for insomnia. She was seen in consult with Dr Rexene Alberts 05/2021 and advised against taking two sleep aids. HST ordered. She was encouraged to decrease caffeine intake.   She has been off zonisamide for 2 weeks. She reports headaches are significantly worse. She has had a bad headache for the past few days. She was able to pick up all meds yesterday and restarted last night. Nurtec She reports that she does not drink caffeine. She endorses significant stress. She is caring for her brother and husband who are both ill. She is not sleeping well despite taking doxepin '200mg'$  and trazodone 25-'50mg'$  (from PCP). She wakes multiple times at night. She has not scheduled  sleep study due to being limited on time. She reports BP has been elevated this week. She feels that it is elevated due to headaches from being off medications.   02/26/2021 ALL:  [name] returns with concerns of worsening headaches. She continues zonisamide '100mg'$  BID with extra '25mg'$  dose daily as needed. She reports worsening over the past 1-2 months. Headaches are more frequent (near daily) and more intense. Similar symptoms has previous headaches. No new symptoms. Most are present in the morning. Can worsen during the day. She has had an intractable migraine for the past 3 days. Tizanidine and Tylenol usually abort migraines but she has not gotten much relief. She continues doxepin '200mg'$  QHS for insomnia managed with our office. Trazodone 25-50 prescribed with PCP. She has interrupted sleep. She does snore. She wakes multiple times at night. She does not wake refreshed. She does have dry mouth. Brother has sleep apnea on CPAP.   Medications tried and failed: Ajovy (ineffective), topiramate, zonisamide (on now), trazodone (on now), sertraline (on now), gabapentin (on now), propranolol, Nurtec, tramadol (takes at night), tizanidine, ondansetron, cyclobenzaprine, metoclopramide, triptans contraindicated due to PVD, CVA and TIA history   10/10/2020 ALL: [name] returns for follow up for migraines and insomnia. She is doing well. She continues zonisamide '100mg'$  BID and doxepin '200mg'$  at bedtime. She has had several stressors since we last saw her. Her brother had to have emergency surgery so she has had to help care for him and his hair salon.  She is seeing Dr Rolena Infante, [facility], and Dr Letta Pate, sports med, for chronic neck and back pain. Dr Letta Pate has her on gabapentin '600mg'$  twice daily that helps some. Dr Stann Mainland for broken shoulder and Dr Margit Banda for chronic knee pain.   Headaches are occurring most days. She feels that she has pain starting in her neck that radiates to the top of her head everyday around  3pm. She has not tried taking extra '25mg'$  dose of imipramine. She is sleeping well. She is tolerating doxepin.   10/10/2019 ALL:  [name] is a 71 y.o. female here today for follow up for headaches. She continues zonisamide '100mg'$  daily. She has a '25mg'$  dose that she rarely takes. She has daily headaches, mostly in the mornings, that are usually easily treatable. She does note that weather changes are a trigger for her. She is able to abort headaches with rest. She may have 1-2 migraines per month.   She continues doxepin '200mg'$  at night for insomnia. She has been on this medication for years. She reports that she has a difficult time sleeping. She struggles to go to sleep at night and to stay asleep. She does wake frequently at night to use the restroom. She wakes with a dry mouth. She is not sure if she snores. She has a brother who uses CPAP therapy.   HISTORY: (copied from my note on 12/09/2018)  [name] is a 72 y.o. female here today for follow up for migraines. She started Ajovy about a year ago. She felt that it helped initially but after a few months it did not seem to help. She has daily dull headaches, usually behind the right eye. About 1-2 times a month she has more severe headache with light, sound sensitivity and nausea. She continues zonisamide '100mg'$  and tizanidine '2mg'$  daily. She takes gabapentin '600mg'$  1-2 times daily for headaches and dysesthesias in her left side. Doxepin helps with sleep. She is followed closely by PCP for BP and cholesterol management. She is intolerant to statins, now on Repatha. On Plavix.    HISTORY: (copied from [place] note on 03/06/2018)   Ms. Defilippo is a 71 year old female with a history of intractable migraine headaches.  She returns today for follow-up.  Overall she feels that she has done relatively well.  He continues to have a daily dull headache that is in the right temporal region.  However she states that she has not had a severe  headache in the last 6 months.  She reports she does have photophobia with her headaches.  She states that if she misses her medication then her headache will worsen.  She reports that she was unable to obtain Aimovig.  She continues on Zonegran and tizanidine.   HISTORY 07/01/17 Ms. Moulin is a 71 year old female with a history of intractable migraine headaches.  She returns today for follow-up.  She reports that she continues to have a daily dull headache.  She remains on Zonegran and tizanidine.  She states that she did receive a letter about Aimovig but was unable to follow-up on this due to her brother being in the hospital.  She states that she only has approximately one severe migraine a month.  Her headaches seem to occur on the right side starting at the neck radiating to the eyes.  She does have photophobia, phonophobia and nausea.  She continues to take doxepin at night.  She returns today for an evaluation.    REVIEW OF SYSTEMS:  Out of a complete 14 system review of symptoms, the patient complains only of the following symptoms, morning headaches, dry mouth, fatigue, chronic pain, insomnia and all other reviewed systems are negative.  ALLERGIES: Allergies  Allergen Reactions   Iodinated Contrast Media Shortness Of Breath and Itching    States she had this reaction connected with a MRI of the cervical spine.   Shellfish Allergy Anaphylaxis    All seafood   Crestor [Rosuvastatin]     Anxiety, itching, headaches, myalgias   Iodine    Lipitor [Atorvastatin]     Anxiety, itching, headaches, myalgias   Statins Other (See Comments)    Arthralgias (severe) with atorvastatin, mild arthralgias with rosuvastatin but willing to continue taking it    HOME MEDICATIONS: Outpatient Medications Prior to Visit  Medication Sig Dispense Refill   albuterol (VENTOLIN HFA) 108 (90 Base) MCG/ACT inhaler Inhale 2 puffs into the lungs every 6 (six) hours as needed for wheezing or shortness of breath. 8  g 6   amLODipine (NORVASC) 5 MG tablet Take 1 tablet (5 mg total) by mouth daily. 90 tablet 3   cetirizine (ZYRTEC) 10 MG tablet Take 10 mg by mouth daily with breakfast.     clopidogrel (PLAVIX) 75 MG tablet Take 1 tablet (75 mg total) by mouth daily. 90 tablet 3   doxepin (SINEQUAN) 100 MG capsule Take 2 capsules (200 mg total) by mouth at bedtime. 180 capsule 3   ezetimibe (ZETIA) 10 MG tablet Take 1 tablet (10 mg total) by mouth daily. 90 tablet 3   fluticasone (FLONASE) 50 MCG/ACT nasal spray Place 1 spray into both nostrils daily. (Patient taking differently: Place 1 spray into both nostrils daily as needed for allergies.)  2   furosemide (LASIX) 40 MG tablet Take 40 mg by mouth daily.     gabapentin (NEURONTIN) 600 MG tablet TAKE 1 TABLET(600 MG) BY MOUTH TWICE DAILY 60 tablet 11   inclisiran (LEQVIO) 284 MG/1.5ML SOSY injection Inject as directed 1.5 mL 0   ondansetron (ZOFRAN) 4 MG tablet Take 1 tablet (4 mg total) by mouth every 8 (eight) hours as needed for nausea or vomiting. 20 tablet 0   pantoprazole (PROTONIX) 40 MG tablet TAKE 1 TABLET(40 MG) BY MOUTH DAILY 90 tablet 1   Rimegepant Sulfate (NURTEC) 75 MG TBDP Take 75 mg by mouth daily as needed (take for abortive therapy of migraine, no more than 1 tablet in 24 hours or 10 per month). 8 tablet 11   rosuvastatin (CRESTOR) 5 MG tablet Take 1 tablet (5 mg total) by mouth daily. 90 tablet 3   sertraline (ZOLOFT) 25 MG tablet Take 1 tablet (25 mg total) by mouth daily. 90 tablet 1   sertraline (ZOLOFT) 50 MG tablet TAKE 1 TABLET(50 MG) BY MOUTH DAILY 90 tablet 1   tiZANidine (ZANAFLEX) 4 MG tablet Take 1 tablet (4 mg total) by mouth every 6 (six) hours as needed for muscle spasms. 60 tablet 2   traMADol (ULTRAM) 50 MG tablet TAKE 1 TABLET EVERY 8 HOURS AS NEEDED FOR MODERATE PAIN 90 tablet 0   traZODone (DESYREL) 50 MG tablet Take 1/2 to 1 tablet (25 mg to 50 mg) at bedtime as needed for sleep. 90 tablet 0   zonisamide (ZONEGRAN) 100 MG  capsule Take 1 capsule (100 mg total) by mouth 2 (two) times daily. 180 capsule 3   zonisamide (ZONEGRAN) 25 MG capsule take 25 mg twice daily prn in addition to 100 mg twice daily. 180 capsule  3   No facility-administered medications prior to visit.    PAST MEDICAL HISTORY: Past Medical History:  Diagnosis Date   Anemia    Cervical neuritis    Chronic kidney disease    stage 3   Dyslipidemia    GERD (gastroesophageal reflux disease)    History of transient ischemic attack (TIA)    HTN (hypertension)    Hyperlipidemia    Migraine    Migraine headache    Occipital neuralgia    Left   Pulmonary embolism ([place]) 2017   Renal insufficiency    Stroke ([place]) 10/2013    PAST SURGICAL HISTORY: Past Surgical History:  Procedure Laterality Date   ABDOMINAL HYSTERECTOMY     ANTERIOR CERVICAL DECOMP/DISCECTOMY FUSION N/A 05/24/2020   Procedure: ANTERIOR CERVICAL DECOMPRESSION/DISCECTOMY FUSION 1 LEVEL C3-4;  Surgeon: Melina Schools, MD;  Location: [place];  Service: Orthopedics;  Laterality: N/A;  3 hrs   BILIARY STENT PLACEMENT N/A 12/12/2015   Procedure: BILIARY STENT PLACEMENT;  Surgeon: Doran Stabler, MD;  Location: [place];  Service: Endoscopy;  Laterality: N/A;   BLADDER REPAIR     CARPAL TUNNEL RELEASE     CERVICAL FUSION     CERVICAL SPINE SURGERY     CHOLECYSTECTOMY N/A 12/06/2015   Procedure: LAPAROSCOPIC CHOLECYSTECTOMY WITH  INTRAOPERATIVE CHOLANGIOGRAM;  Surgeon: Stark Klein, MD;  Location: [place];  Service: General;  Laterality: N/A;   ELBOW SURGERY     ERCP N/A 12/12/2015   Procedure: ENDOSCOPIC RETROGRADE CHOLANGIOPANCREATOGRAPHY (ERCP);  Surgeon: Doran Stabler, MD;  Location: [facility] ENDOSCOPY;  Service: Endoscopy;  Laterality: N/A;   ESOPHAGOGASTRODUODENOSCOPY N/A 01/28/2016   Procedure: ESOPHAGOGASTRODUODENOSCOPY (EGD) Biliary STENT removal;  Surgeon: Doran Stabler, MD;  Location: [facility] ENDOSCOPY;  Service: Gastroenterology;  Laterality: N/A;   IR GENERIC  HISTORICAL  12/17/2015   IR US GUIDE BX ASP/DRAIN 12/17/2015 [facility]   IR GENERIC HISTORICAL  12/17/2015   IR GUIDED DRAIN W CATHETER PLACEMENT 12/17/2015 [facility]   IR GENERIC HISTORICAL  12/17/2015   IR SINUS/FIST TUBE CHK-NON GI 12/17/2015 [facility]   KNEE SURGERY     LUNG SURGERY     TEE WITHOUT CARDIOVERSION N/A 12/19/2015   Procedure: TRANSESOPHAGEAL ECHOCARDIOGRAM (TEE);  Surgeon: Josue Hector, MD;  Location: [facility] ENDOSCOPY;  Service: Cardiovascular;  Laterality: N/A;    FAMILY HISTORY: Family History  Problem Relation Age of Onset   Cancer Mother 10       breast   Alzheimer's disease Mother    Prostate cancer Father    Cancer Brother    Hyperlipidemia Brother    Lung cancer Father    Dementia Maternal Grandmother    Cancer Maternal Grandfather    Cancer Brother    Hyperlipidemia Brother    Dementia Brother        frontotemporal dementia    SOCIAL HISTORY: Social History   Socioeconomic History   Marital status: Married    Spouse name: [name]   Number of children: 1   Years of education: 12+   Highest education level: High school graduate  Occupational History   Occupation: ACTIVITY DIRECTOR    Employer: [employer]   Occupation: CNA  Tobacco Use   Smoking status: Former    Packs/day: 0.30    Types: Cigarettes    Quit date: 11/01/2011    Years since quitting: 9.9   Smokeless tobacco: Never  Vaping Use   Vaping Use: Never used  Substance and Sexual Activity  Alcohol use: No    Alcohol/week: 0.0 standard drinks of alcohol   Drug use: No   Sexual activity: Yes    Partners: Male    Birth control/protection: Surgical    Comment: 1 sexual partner in last 12 months: married  Other Topics Concern   Not on file  Social History Narrative    Patient is married ([name]).   Patient has one child and grandchild. Her sons name is Shanon Brow. Patient sees them often.   Patient is a caregiver to an older lady. Patient runs errands for her, cooks and  cleans. Patient spends 6-10 hours with her 6 days a week.    Patient has a college education.   Patient is very active in her church.    Hobbies- outside crafts and church choir.   Social Determinants of Health   Financial Resource Strain: Low Risk  (09/17/2018)   Overall Financial Resource Strain (CARDIA)    Difficulty of Paying Living Expenses: Not hard at all  Food Insecurity: No Food Insecurity (09/17/2018)   Hunger Vital Sign    Worried About Running Out of Food in the Last Year: Never true    Ran Out of Food in the Last Year: Never true  Transportation Needs: No Transportation Needs (09/17/2018)   PRAPARE - Hydrologist (Medical): No    Lack of Transportation (Non-Medical): No  Physical Activity: Insufficiently Active (09/17/2018)   Exercise Vital Sign    Days of Exercise per Week: 7 days    Minutes of Exercise per Session: 20 min  Stress: No Stress Concern Present (09/17/2018)   [place]    Feeling of Stress : Only a little  Social Connections: Moderately Integrated (09/17/2018)   Social Connection and Isolation Panel [NHANES]    Frequency of Communication with Friends and Family: Twice a week    Frequency of Social Gatherings with Friends and Family: More than three times a week    Attends Religious Services: 1 to 4 times per year    Active Member of [employer] or Organizations: No    Attends Archivist Meetings: Never    Marital Status: Married  Human resources officer Violence: Not At Risk (09/17/2018)   Humiliation, Afraid, Rape, and Kick questionnaire    Fear of Current or Ex-Partner: No    Emotionally Abused: No    Physically Abused: No    Sexually Abused: No      PHYSICAL EXAM  Vitals:   10/14/21 0717  BP: 138/82  Pulse: 97  Weight: 182 lb (82.6 kg)  Height: '5\' 6"'$  (1.676 m)       Body mass index is 29.38 kg/m.  Generalized: Well developed, in no acute  distress  Cardiology: normal rate and rhythm, no murmur noted Respiratory: clear to auscultation bilaterally  Neurological examination  Mentation: Alert oriented to time, place, history taking. Follows all commands speech and language fluent Cranial nerve II-XII: Pupils were equal round reactive to light. Extraocular movements were full, visual field were full on confrontational test. Facial sensation and strength were normal. Head turning and shoulder shrug  were normal and symmetric. Motor: The motor testing reveals 5 over 5 strength of all 4 extremities. Good symmetric motor tone is noted throughout.  Sensory: Sensory testing is intact to soft touch on all 4 extremities. No evidence of extinction is noted.   Gait and station: Gait is stable without assistive device    DIAGNOSTIC DATA (LABS,  IMAGING, TESTING) - I reviewed patient records, labs, notes, testing and imaging myself where available.      No data to display           Lab Results  Component Value Date   WBC 11.5 (H) 05/22/2020   HGB 13.6 05/22/2020   HCT 42.0 05/22/2020   MCV 84 05/22/2020   PLT 322 05/22/2020      Component Value Date/Time   NA 143 08/06/2021 1518   K 3.9 08/06/2021 1518   CL 102 08/06/2021 1518   CO2 25 08/06/2021 1518   GLUCOSE 99 08/06/2021 1518   GLUCOSE 135 (H) 08/29/2019 1030   BUN 15 08/06/2021 1518   CREATININE 1.21 (H) 08/06/2021 1518   CREATININE 1.10 (H) 02/25/2016 0907   CALCIUM 9.1 08/06/2021 1518   PROT 6.7 08/06/2021 1518   ALBUMIN 4.4 08/06/2021 1518   AST 25 08/06/2021 1518   ALT 23 08/06/2021 1518   ALKPHOS 118 08/06/2021 1518   BILITOT 0.3 08/06/2021 1518   GFRNONAA >60 08/29/2019 1030   GFRNONAA 53 (L) 02/25/2016 0907   GFRAA >60 08/29/2019 1030   GFRAA 61 02/25/2016 0907   Lab Results  Component Value Date   CHOL 143 08/06/2021   HDL 61 08/06/2021   LDLCALC 59 08/06/2021   LDLDIRECT 156 (H) 03/27/2017   TRIG 133 08/06/2021   CHOLHDL 2.3 08/06/2021   Lab  Results  Component Value Date   HGBA1C 5.5 07/30/2017   Lab Results  Component Value Date   VITAMINB12 290 07/21/2017   Lab Results  Component Value Date   TSH 1.610 11/03/2018     ASSESSMENT AND PLAN 71 y.o. year old female  has a past medical history of Anemia, Cervical neuritis, Chronic kidney disease, Dyslipidemia, GERD (gastroesophageal reflux disease), History of transient ischemic attack (TIA), HTN (hypertension), Hyperlipidemia, Migraine, Migraine headache, Occipital neuralgia, Pulmonary embolism ([place]) (2017), Renal insufficiency, and Stroke ([place]) (10/2013). here with     ICD-10-CM   1. Migraine without aura and without status migrainosus, not intractable  G43.009     2. OSA on CPAP  G47.33    Z99.89        Elsye reports headaches have improved since restarting medication. We will continue zonisamide '100mg'$  twice daily with extra '25mg'$  dose twice daily as needed. She will continue Nurtec daily as needed for abortive therapy. Insomnia is better on doxepin '200mg'$  at bedtime. We have discussed need to wean dosing. She has taken '200mg'$  daily for many years. Once migraines are improved, we will start weaning dose. She was advised against taking trazodone with doxepin. She was encouraged to reach out to [place] to get set up with autoPAP. She will continue working on healthy lifestyle habits. She will follow up with me in September for initial autoPAP compliance review.    No orders of the defined types were placed in this encounter.     No orders of the defined types were placed in this encounter.       Debbora Presto, FNP-C 10/14/2021, 7:46 AM [facility] [address], [place], [place] [address] [phone]

## 2021-10-11 ENCOUNTER — Encounter (HOSPITAL_COMMUNITY): Payer: Medicare Other

## 2021-10-12 ENCOUNTER — Other Ambulatory Visit: Payer: Self-pay | Admitting: Student

## 2021-10-14 ENCOUNTER — Ambulatory Visit (INDEPENDENT_AMBULATORY_CARE_PROVIDER_SITE_OTHER): Payer: Medicare Other | Admitting: Family Medicine

## 2021-10-14 ENCOUNTER — Ambulatory Visit: Payer: Medicare Other | Admitting: Family Medicine

## 2021-10-14 ENCOUNTER — Telehealth: Payer: Self-pay | Admitting: Family Medicine

## 2021-10-14 ENCOUNTER — Encounter: Payer: Self-pay | Admitting: Family Medicine

## 2021-10-14 VITALS — BP 138/82 | HR 97 | Ht 66.0 in | Wt 182.0 lb

## 2021-10-14 DIAGNOSIS — Z9989 Dependence on other enabling machines and devices: Secondary | ICD-10-CM | POA: Diagnosis not present

## 2021-10-14 DIAGNOSIS — G4733 Obstructive sleep apnea (adult) (pediatric): Secondary | ICD-10-CM | POA: Diagnosis not present

## 2021-10-14 DIAGNOSIS — G43009 Migraine without aura, not intractable, without status migrainosus: Secondary | ICD-10-CM

## 2021-10-14 NOTE — Telephone Encounter (Signed)
Jury Duty

## 2021-10-14 NOTE — Progress Notes (Signed)
Update from Tammy/[facility] on getting pt set up for CPAP: "Okay, I have researched this and it appears our office faxed over to you all to have the sleep study signed.  Not sure why this one didn't have the electronic signature. However, we are calling the patient to get her scheduled.   [facility] faxed back over the sleep study, and apparently, we are missing the electronic signature.  It's a Medicare and our [place] review team sent it back stating they couldn't see an electronic signature.  I'm going to fax over a CMS form to see if you can have Dr. Rexene Alberts to sign ?  This is Medicare why it is needed."  I faxed sleep study results w/ Dr. Rexene Alberts electronic signature at [phone], received fax confirmation. Asked that Tammy fax CMS for MD to sign at [phone].

## 2021-10-15 ENCOUNTER — Telehealth: Payer: Self-pay | Admitting: Family Medicine

## 2021-10-15 ENCOUNTER — Ambulatory Visit (INDEPENDENT_AMBULATORY_CARE_PROVIDER_SITE_OTHER): Payer: Medicare Other | Admitting: Student

## 2021-10-15 ENCOUNTER — Encounter: Payer: Self-pay | Admitting: Student

## 2021-10-15 DIAGNOSIS — G4700 Insomnia, unspecified: Secondary | ICD-10-CM | POA: Diagnosis not present

## 2021-10-15 DIAGNOSIS — I10 Essential (primary) hypertension: Secondary | ICD-10-CM

## 2021-10-15 DIAGNOSIS — I25118 Atherosclerotic heart disease of native coronary artery with other forms of angina pectoris: Secondary | ICD-10-CM

## 2021-10-15 MED ORDER — TIZANIDINE HCL 4 MG PO TABS: 4.0000 mg | ORAL_TABLET | Freq: Four times a day (QID) | ORAL | 2 refills | Status: DC | PRN

## 2021-10-15 NOTE — Progress Notes (Unsigned)
    SUBJECTIVE:   CHIEF COMPLAINT / HPI:   HTN BP has ranged from 130-140/60-70's. Is doing well on Amlodipine 5 mg. Blood pressure ***  Insomnia Takes Doxepin and trazadone at bedtime but states within 3 hours she is awake and cannot fall back asleep for about an hour. States sometimes she has a difficult time sleeping d/t anxiety. She was recently diagnosed with sleep apnea and will get a CPAP soon.   PERTINENT  PMH / PSH: ***  OBJECTIVE:   BP 117/75   Pulse 90   Ht '5\' 6"'$  (1.676 m)   Wt 181 lb 8 oz (82.3 kg)   SpO2 95%   BMI 29.29 kg/m    General: NAD, pleasant, able to participate in exam Cardiac: RRR, no murmurs. Respiratory: CTAB, normal effort, No wheezes, rales or rhonchi Abdomen: Bowel sounds present, nontender, nondistended, no hepatosplenomegaly. Extremities: no edema or cyanosis. Skin: warm and dry, no rashes noted Neuro: alert, no obvious focal deficits Psych: Normal affect and mood  ASSESSMENT/PLAN:   No problem-specific Assessment & Plan notes found for this encounter.     Dr. Precious Gilding, [place]    {    This will disappear when note is signed, click to select method of visit    :1}

## 2021-10-15 NOTE — Patient Instructions (Addendum)
It was great to see you! Thank you for allowing me to participate in your care!  I recommend that you always bring your medications to each appointment as this makes it easy to ensure you are on the correct medications and helps [place] not miss when refills are needed.  Our plans for today:  -Please return for a follow-up visit in 4 to 6 months -[place] for the beautiful diaper bag and other gifts : )    Take care and seek immediate care sooner if you develop any concerns.   Dr. Precious Gilding, DO [facility]

## 2021-10-15 NOTE — Telephone Encounter (Signed)
E-scribed refill 

## 2021-10-15 NOTE — Telephone Encounter (Signed)
Pt request refill for tiZANidine (ZANAFLEX) 4 MG tablet at [id number]

## 2021-10-16 NOTE — Assessment & Plan Note (Signed)
Pt has no difficulty falling asleep and would therefore like to continue on Doxepin and trazodone. She has difficulty staying asleep. She would like to see if CPAP will help which will be delivered soon. We discussed sleep hygiene and I recommended pt refrain from watching screens when waking up at night and to find a relaxing activity she can do in her bed with dim lighting such as listening to music, reading, puzzles, etc. We also discussed going through the alphabet and naming 3 things that start with each letter in her head to distract her when her thoughts are keeping her up at night.

## 2021-10-16 NOTE — Assessment & Plan Note (Signed)
BP initially mildly elevated but on repeat was below goal. -continue Amlodipine 5 mg daily.

## 2021-10-23 ENCOUNTER — Other Ambulatory Visit: Payer: Self-pay | Admitting: Student

## 2021-10-25 DIAGNOSIS — M1712 Unilateral primary osteoarthritis, left knee: Secondary | ICD-10-CM | POA: Diagnosis not present

## 2021-10-25 DIAGNOSIS — M1711 Unilateral primary osteoarthritis, right knee: Secondary | ICD-10-CM | POA: Diagnosis not present

## 2021-10-29 DIAGNOSIS — S42201S Unspecified fracture of upper end of right humerus, sequela: Secondary | ICD-10-CM | POA: Diagnosis not present

## 2021-10-31 ENCOUNTER — Encounter: Payer: Medicare Other | Attending: Physical Medicine & Rehabilitation | Admitting: Physical Medicine & Rehabilitation

## 2021-10-31 ENCOUNTER — Encounter: Payer: Self-pay | Admitting: Physical Medicine & Rehabilitation

## 2021-10-31 VITALS — BP 130/87 | HR 81 | Ht 66.0 in | Wt 177.8 lb

## 2021-10-31 DIAGNOSIS — I25118 Atherosclerotic heart disease of native coronary artery with other forms of angina pectoris: Secondary | ICD-10-CM | POA: Diagnosis not present

## 2021-10-31 DIAGNOSIS — M533 Sacrococcygeal disorders, not elsewhere classified: Secondary | ICD-10-CM

## 2021-10-31 DIAGNOSIS — G8929 Other chronic pain: Secondary | ICD-10-CM

## 2021-10-31 DIAGNOSIS — M48061 Spinal stenosis, lumbar region without neurogenic claudication: Secondary | ICD-10-CM

## 2021-10-31 NOTE — Progress Notes (Signed)
Subjective:    Patient ID: [name], female    DOB: 1950-11-30, 71 y.o.   MRN: [id number]  HPI 71 year old female with multifactorial pain issues.  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  She later was hospitalized for pelvic fracture after a fall and also has been diagnosed with lumbar spinal stenosis as well as cervical spinal stenosis.  She has undergone ACDF per Dr. Rolena Infante.  Right SI pain intermittently acting up, this occurs with walking and bending . Right SI RF last performed 06/07/21 Left sacroiliac RF 08/30/21 doing very well Pain Inventory Average Pain 7 Pain Right Now 7 My pain is sharp, dull, and aching  In the last 24 hours, has pain interfered with the following? General activity 7 Relation with others 7 Enjoyment of life 7 What TIME of day is your pain at its worst? evening and night Sleep (in general) Poor  Pain is worse with: walking and bending Pain improves with: rest and medication Relief from Meds: 7  Family History  Problem Relation Age of Onset   Cancer Mother 72       breast   Alzheimer's disease Mother    Prostate cancer Father    Cancer Brother    Hyperlipidemia Brother    Lung cancer Father    Dementia Maternal Grandmother    Cancer Maternal Grandfather    Cancer Brother    Hyperlipidemia Brother    Dementia Brother        frontotemporal dementia   Social History   Socioeconomic History   Marital status: Married    Spouse name: [name]   Number of children: 1   Years of education: 12+   Highest education level: High school graduate  Occupational History   Occupation: ACTIVITY DIRECTOR    Employer: [employer]   Occupation: CNA  Tobacco Use   Smoking status: Former    Packs/day: 0.30    Types: Cigarettes    Quit date: 11/01/2011    Years since quitting: 10.0   Smokeless tobacco: Never  Vaping Use   Vaping Use: Never used  Substance and Sexual Activity    Alcohol use: No    Alcohol/week: 0.0 standard drinks of alcohol   Drug use: No   Sexual activity: Yes    Partners: Male    Birth control/protection: Surgical    Comment: 1 sexual partner in last 12 months: married  Other Topics Concern   Not on file  Social History Narrative    Patient is married ([name]).   Patient has one child and grandchild. Her sons name is Shanon Brow. Patient sees them often.   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 Patient spends 6-10 hours with her 6 days a week.    Patient has a college education.   Patient is very active in her church.    Hobbies- outside crafts and church choir.   Social Determinants of Health   Financial Resource Strain: Low Risk  (09/17/2018)   Overall Financial Resource Strain (CARDIA)    Difficulty of Paying Living Expenses: Not hard at all  Food Insecurity: No Food Insecurity (09/17/2018)   Hunger Vital Sign    Worried About Running Out of Food in the Last Year: Never true    Ran Out of Food in the Last Year: Never true  Transportation Needs: No Transportation Needs (09/17/2018)   PRAPARE - Transportation  Lack of Transportation (Medical): No    Lack of Transportation (Non-Medical): No  Physical Activity: Insufficiently Active (09/17/2018)   Exercise Vital Sign    Days of Exercise per Week: 7 days    Minutes of Exercise per Session: 20 min  Stress: No Stress Concern Present (09/17/2018)   [place]    Feeling of Stress : Only a little  Social Connections: Moderately Integrated (09/17/2018)   Social Connection and Isolation Panel [NHANES]    Frequency of Communication with Friends and Family: Twice a week    Frequency of Social Gatherings with Friends and Family: More than three times a week    Attends Religious Services: 1 to 4 times per year    Active Member of [employer] or Organizations: No    Attends Archivist  Meetings: Never    Marital Status: Married   Past Surgical History:  Procedure Laterality Date   ABDOMINAL HYSTERECTOMY     ANTERIOR CERVICAL DECOMP/DISCECTOMY FUSION N/A 05/24/2020   Procedure: ANTERIOR CERVICAL DECOMPRESSION/DISCECTOMY FUSION 1 LEVEL C3-4;  Surgeon: Melina Schools, MD;  Location: [place] [facility];  Service: Orthopedics;  Laterality: N/A;  3 hrs   BILIARY STENT PLACEMENT N/A 12/12/2015   Procedure: BILIARY STENT PLACEMENT;  Surgeon: Doran Stabler, MD;  Location: [place];  Service: Endoscopy;  Laterality: N/A;   BLADDER REPAIR     CARPAL TUNNEL RELEASE     CERVICAL FUSION     CERVICAL SPINE SURGERY     CHOLECYSTECTOMY N/A 12/06/2015   Procedure: LAPAROSCOPIC CHOLECYSTECTOMY WITH  INTRAOPERATIVE CHOLANGIOGRAM;  Surgeon: Stark Klein, MD;  Location: [place];  Service: General;  Laterality: N/A;   ELBOW SURGERY     ERCP N/A 12/12/2015   Procedure: ENDOSCOPIC RETROGRADE CHOLANGIOPANCREATOGRAPHY (ERCP);  Surgeon: Doran Stabler, MD;  Location: [facility] ENDOSCOPY;  Service: Endoscopy;  Laterality: N/A;   ESOPHAGOGASTRODUODENOSCOPY N/A 01/28/2016   Procedure: ESOPHAGOGASTRODUODENOSCOPY (EGD) Biliary STENT removal;  Surgeon: Doran Stabler, MD;  Location: [facility] ENDOSCOPY;  Service: Gastroenterology;  Laterality: N/A;   IR GENERIC HISTORICAL  12/17/2015   IR US GUIDE BX ASP/DRAIN 12/17/2015 [facility]   IR GENERIC HISTORICAL  12/17/2015   IR GUIDED DRAIN W CATHETER PLACEMENT 12/17/2015 [facility]   IR GENERIC HISTORICAL  12/17/2015   IR SINUS/FIST TUBE CHK-NON GI 12/17/2015 [facility]   KNEE SURGERY     LUNG SURGERY     TEE WITHOUT CARDIOVERSION N/A 12/19/2015   Procedure: TRANSESOPHAGEAL ECHOCARDIOGRAM (TEE);  Surgeon: Josue Hector, MD;  Location: [facility] ENDOSCOPY;  Service: Cardiovascular;  Laterality: N/A;   Past Surgical History:  Procedure Laterality Date   ABDOMINAL HYSTERECTOMY     ANTERIOR CERVICAL DECOMP/DISCECTOMY FUSION N/A 05/24/2020   Procedure: ANTERIOR CERVICAL  DECOMPRESSION/DISCECTOMY FUSION 1 LEVEL C3-4;  Surgeon: Melina Schools, MD;  Location: [place];  Service: Orthopedics;  Laterality: N/A;  3 hrs   BILIARY STENT PLACEMENT N/A 12/12/2015   Procedure: BILIARY STENT PLACEMENT;  Surgeon: Doran Stabler, MD;  Location: [place];  Service: Endoscopy;  Laterality: N/A;   BLADDER REPAIR     CARPAL TUNNEL RELEASE     CERVICAL FUSION     CERVICAL SPINE SURGERY     CHOLECYSTECTOMY N/A 12/06/2015   Procedure: LAPAROSCOPIC CHOLECYSTECTOMY WITH  INTRAOPERATIVE CHOLANGIOGRAM;  Surgeon: Stark Klein, MD;  Location: [place];  Service: General;  Laterality: N/A;   ELBOW SURGERY     ERCP N/A 12/12/2015   Procedure: ENDOSCOPIC  RETROGRADE CHOLANGIOPANCREATOGRAPHY (ERCP);  Surgeon: Doran Stabler, MD;  Location: [facility] ENDOSCOPY;  Service: Endoscopy;  Laterality: N/A;   ESOPHAGOGASTRODUODENOSCOPY N/A 01/28/2016   Procedure: ESOPHAGOGASTRODUODENOSCOPY (EGD) Biliary STENT removal;  Surgeon: Doran Stabler, MD;  Location: [facility];  Service: Gastroenterology;  Laterality: N/A;   IR GENERIC HISTORICAL  12/17/2015   IR US GUIDE BX ASP/DRAIN 12/17/2015 [facility]   IR GENERIC HISTORICAL  12/17/2015   IR GUIDED DRAIN W CATHETER PLACEMENT 12/17/2015 [facility]   IR GENERIC HISTORICAL  12/17/2015   IR SINUS/FIST TUBE CHK-NON GI 12/17/2015 [facility]   KNEE SURGERY     LUNG SURGERY     TEE WITHOUT CARDIOVERSION N/A 12/19/2015   Procedure: TRANSESOPHAGEAL ECHOCARDIOGRAM (TEE);  Surgeon: Josue Hector, MD;  Location: [facility] ENDOSCOPY;  Service: Cardiovascular;  Laterality: N/A;   Past Medical History:  Diagnosis Date   Anemia    Cervical neuritis    Chronic kidney disease    stage 3   Dyslipidemia    GERD (gastroesophageal reflux disease)    History of transient ischemic attack (TIA)    HTN (hypertension)    Hyperlipidemia    Migraine    Migraine headache    Occipital neuralgia    Left   Pulmonary embolism ([place]) 2017   Renal insufficiency    Stroke  ([place]) 10/2013   BP 130/87   Pulse 81   Ht '5\' 6"'$  (1.676 m)   Wt 177 lb 12.8 oz (80.6 kg)   SpO2 94%   BMI 28.70 kg/m   Opioid Risk Score:   Fall Risk Score:  `1  Depression screen [facility] 2/9     10/31/2021   12:52 PM 10/15/2021    2:00 PM 08/30/2021   12:39 PM 08/06/2021    1:56 PM 07/23/2021   10:07 AM 06/07/2021    1:28 PM 05/10/2021    4:28 PM  Depression screen PHQ 2/9  Decreased Interest 0 1 0 '1 1 1 2  '$ Down, Depressed, Hopeless 1 1 0 '1 1 1 2  '$ PHQ - 2 Score 1 2 0 '2 2 2 4  '$ Altered sleeping  '2  3   2  '$ Tired, decreased energy  '2  3   2  '$ Change in appetite  '2  2   2  '$ Feeling bad or failure about yourself   1  0   2  Trouble concentrating  '2  1   2  '$ Moving slowly or fidgety/restless  1  0   2  Suicidal thoughts  0  0   0  PHQ-9 Score  '12  11   16  '$ Difficult doing work/chores  Somewhat difficult          Review of Systems  Constitutional: Negative.   HENT: Negative.    Eyes: Negative.   Respiratory: Negative.    Cardiovascular: Negative.   Gastrointestinal: Negative.   Endocrine: Negative.   Genitourinary: Negative.   Musculoskeletal:  Positive for back pain.  Skin: Negative.   Allergic/Immunologic: Negative.   Neurological: Negative.   Hematological: Negative.   Psychiatric/Behavioral:  Positive for dysphoric mood and sleep disturbance.       Objective:   Physical Exam Vitals and nursing note reviewed.  Constitutional:      Appearance: She is obese.  Eyes:     Extraocular Movements: Extraocular movements intact.     Conjunctiva/sclera: Conjunctivae normal.     Pupils: Pupils are equal, round, and reactive to light.  Musculoskeletal:  Comments: Positive Faber's on the right side positive thigh thrust on the right side negative Faber's on the left side mildly positive thigh thrust on the left side  Neurological:     Mental Status: She is oriented to person, place, and time.     Comments: Negative straight leg raising bilaterally Motor strength is 5/5 bilateral  hip flexor knee extensor ankle dorsiflexor Ambulates with a cane no evidence of toe drag or knee instability  Psychiatric:        Mood and Affect: Mood normal.        Behavior: Behavior normal.           Assessment & Plan:  Bilateral sacroiliac disorder doing well with the left side status post left sacroiliac RF in June 2023.  The right sacroiliac RF in March 2023 is starting to wear off we will schedule her in 6 weeks for repeat procedure Continue tramadol Continue gabapentin she has tramadol and gabapentin refills.

## 2021-11-12 ENCOUNTER — Ambulatory Visit (HOSPITAL_COMMUNITY)
Admission: RE | Admit: 2021-11-12 | Discharge: 2021-11-12 | Disposition: A | Discharge status: Home / Self Care | Payer: Medicare Other | Source: Ambulatory Visit | Attending: Cardiovascular Disease | Admitting: Cardiovascular Disease

## 2021-11-12 DIAGNOSIS — E785 Hyperlipidemia, unspecified: Secondary | ICD-10-CM | POA: Diagnosis not present

## 2021-11-12 MED ORDER — INCLISIRAN SODIUM 284 MG/1.5ML ~~LOC~~ SOSY
PREFILLED_SYRINGE | SUBCUTANEOUS | Status: AC
  Filled 2021-11-12: qty 1.5

## 2021-11-12 MED ORDER — INCLISIRAN SODIUM 284 MG/1.5ML ~~LOC~~ SOSY
284.0000 mg | PREFILLED_SYRINGE | Freq: Once | SUBCUTANEOUS | Status: AC
  Administered 2021-11-12: 284 mg via SUBCUTANEOUS

## 2021-11-18 NOTE — Progress Notes (Unsigned)
PATIENT: [name] DOB: 1950/10/02  REASON FOR VISIT: follow up HISTORY FROM: patient PRIMARY NEUROLOGIST: Dr. Frances Furbish  Chief Complaint  Patient presents with   Follow-up    Pt in 18 pt here for CPAP f/u Pt states headaches are worse and not able to sleep well with mask on      HISTORY OF PRESENT ILLNESS: Today 11/19/21:  [name] is a 71 year old female with a history of OSA on CPAP. She returns today for follow-up. Reports that she finds the mask uncomfortable.  Therefore some nights she does not even put it on.  Still dealing with headaches.  Recently saw Any Lomax in August.  Her download is below    REVIEW OF SYSTEMS: Out of a complete 14 system review of symptoms, the patient complains only of the following symptoms, and all other reviewed systems are negative.  FSS ESS  ALLERGIES: Allergies  Allergen Reactions   Iodinated Contrast Media Shortness Of Breath and Itching    States she had this reaction connected with a MRI of the cervical spine.   Shellfish Allergy Anaphylaxis    All seafood   Crestor [Rosuvastatin]     Anxiety, itching, headaches, myalgias   Iodine    Lipitor [Atorvastatin]     Anxiety, itching, headaches, myalgias   Statins Other (See Comments)    Arthralgias (severe) with atorvastatin, mild arthralgias with rosuvastatin but willing to continue taking it    HOME MEDICATIONS: Outpatient Medications Prior to Visit  Medication Sig Dispense Refill   albuterol (VENTOLIN HFA) 108 (90 Base) MCG/ACT inhaler Inhale 2 puffs into the lungs every 6 (six) hours as needed for wheezing or shortness of breath. 8 g 6   amLODipine (NORVASC) 5 MG tablet Take 1 tablet (5 mg total) by mouth daily. 90 tablet 3   cetirizine (ZYRTEC) 10 MG tablet Take 10 mg by mouth daily with breakfast.     clopidogrel (PLAVIX) 75 MG tablet Take 1 tablet (75 mg total) by mouth daily. 90 tablet 3   doxepin (SINEQUAN) 100 MG capsule Take 2 capsules (200 mg total) by mouth at  bedtime. 180 capsule 3   ezetimibe (ZETIA) 10 MG tablet Take 1 tablet (10 mg total) by mouth daily. 90 tablet 3   fluticasone (FLONASE) 50 MCG/ACT nasal spray Place 1 spray into both nostrils daily. (Patient taking differently: Place 1 spray into both nostrils daily as needed for allergies.)  2   furosemide (LASIX) 40 MG tablet Take 40 mg by mouth daily.     gabapentin (NEURONTIN) 600 MG tablet TAKE 1 TABLET(600 MG) BY MOUTH TWICE DAILY 60 tablet 11   inclisiran (LEQVIO) 284 MG/1.5ML SOSY injection Inject as directed 1.5 mL 0   ondansetron (ZOFRAN) 4 MG tablet Take 1 tablet (4 mg total) by mouth every 8 (eight) hours as needed for nausea or vomiting. 20 tablet 0   pantoprazole (PROTONIX) 40 MG tablet TAKE 1 TABLET(40 MG) BY MOUTH TWICE DAILY 180 tablet 1   Rimegepant Sulfate (NURTEC) 75 MG TBDP Take 75 mg by mouth daily as needed (take for abortive therapy of migraine, no more than 1 tablet in 24 hours or 10 per month). 8 tablet 11   rosuvastatin (CRESTOR) 5 MG tablet Take 1 tablet (5 mg total) by mouth daily. 90 tablet 3   sertraline (ZOLOFT) 25 MG tablet Take 1 tablet (25 mg total) by mouth daily. 90 tablet 1   sertraline (ZOLOFT) 50 MG tablet TAKE 1 TABLET(50 MG)  BY MOUTH DAILY 90 tablet 1   tiZANidine (ZANAFLEX) 4 MG tablet Take 1 tablet (4 mg total) by mouth every 6 (six) hours as needed for muscle spasms. 60 tablet 2   traMADol (ULTRAM) 50 MG tablet TAKE 1 TABLET EVERY 8 HOURS AS NEEDED FOR MODERATE PAIN 90 tablet 0   traZODone (DESYREL) 50 MG tablet TAKE 1/2 TO 1 TABLET AT BEDTIME AS NEEDED FOR SLEEP 90 tablet 0   zonisamide (ZONEGRAN) 100 MG capsule Take 1 capsule (100 mg total) by mouth 2 (two) times daily. 180 capsule 3   zonisamide (ZONEGRAN) 25 MG capsule take 25 mg twice daily prn in addition to 100 mg twice daily. 180 capsule 3   No facility-administered medications prior to visit.    PAST MEDICAL HISTORY: Past Medical History:  Diagnosis Date   Anemia    Cervical neuritis     Chronic kidney disease    stage 3   Dyslipidemia    GERD (gastroesophageal reflux disease)    History of transient ischemic attack (TIA)    HTN (hypertension)    Hyperlipidemia    Migraine    Migraine headache    Occipital neuralgia    Left   Pulmonary embolism ([facility]) 2017   Renal insufficiency    Stroke ([facility]) 10/2013    PAST SURGICAL HISTORY: Past Surgical History:  Procedure Laterality Date   ABDOMINAL HYSTERECTOMY     ANTERIOR CERVICAL DECOMP/DISCECTOMY FUSION N/A 05/24/2020   Procedure: ANTERIOR CERVICAL DECOMPRESSION/DISCECTOMY FUSION 1 LEVEL C3-4;  Surgeon: Venita Lick, MD;  [facility];  Service: Orthopedics;  Laterality: N/A;  3 hrs   BILIARY STENT PLACEMENT N/A 12/12/2015   Procedure: BILIARY STENT PLACEMENT;  Surgeon: Sherrilyn Rist, MD;  [facility];  Service: Endoscopy;  Laterality: N/A;   BLADDER REPAIR     CARPAL TUNNEL RELEASE     CERVICAL FUSION     CERVICAL SPINE SURGERY     CHOLECYSTECTOMY N/A 12/06/2015   Procedure: LAPAROSCOPIC CHOLECYSTECTOMY WITH  INTRAOPERATIVE CHOLANGIOGRAM;  Surgeon: Almond Lint, MD;  [facility];  Service: General;  Laterality: N/A;   ELBOW SURGERY     ERCP N/A 12/12/2015   Procedure: ENDOSCOPIC RETROGRADE CHOLANGIOPANCREATOGRAPHY (ERCP);  Surgeon: Sherrilyn Rist, MD;  Location[facility] ENDOSCOPY;  Service: Endoscopy;  Laterality: N/A;   ESOPHAGOGASTRODUODENOSCOPY N/A 01/28/2016   Procedure: ESOPHAGOGASTRODUODENOSCOPY (EGD) Biliary STENT removal;  Surgeon: Sherrilyn Rist, MD;  [facility];  Service: Gastroenterology;  Laterality: N/A;   IR GENERIC HISTORICAL  12/17/2015   IR US GUIDE BX ASP/DRAIN 12/17/2015 [facility]   IR GENERIC HISTORICAL  12/17/2015   IR GUIDED DRAIN W CATHETER PLACEMENT 12/17/2015 [facility]   IR GENERIC HISTORICAL  12/17/2015   IR SINUS/FIST TUBE CHK-NON GI 12/17/2015 [facility]   KNEE SURGERY     LUNG SURGERY     TEE WITHOUT CARDIOVERSION N/A 12/19/2015   Procedure:  TRANSESOPHAGEAL ECHOCARDIOGRAM (TEE);  Surgeon: Wendall Stade, MD;  Location: [facility] ENDOSCOPY;  Service: Cardiovascular;  Laterality: N/A;    FAMILY HISTORY: Family History  Problem Relation Age of Onset   Cancer Mother 47       breast   Alzheimer's disease Mother    Prostate cancer Father    Lung cancer Father    Cancer Brother    Hyperlipidemia Brother    Sleep apnea Brother    Cancer Brother    Hyperlipidemia Brother    Dementia Brother  frontotemporal dementia   Dementia Maternal Grandmother    Cancer Maternal Grandfather     SOCIAL HISTORY: Social History   Socioeconomic History   Marital status: Married    Spouse name: [name]   Number of children: 1   Years of education: 12+   Highest education level: High school graduate  Occupational History   Occupation: ACTIVITY DIRECTOR    Employer: [employer]   Occupation: CNA  Tobacco Use   Smoking status: Former    Packs/day: 0.30    Types: Cigarettes    Quit date: 11/01/2011    Years since quitting: 10.0   Smokeless tobacco: Never  Vaping Use   Vaping Use: Never used  Substance and Sexual Activity   Alcohol use: No    Alcohol/week: 0.0 standard drinks of alcohol   Drug use: No   Sexual activity: Yes    Partners: Male    Birth control/protection: Surgical    Comment: 1 sexual partner in last 12 months: married  Other Topics Concern   Not on file  Social History Narrative    Patient is married ([name]).   Patient has one child and grandchild. Her sons name is [name]. Patient sees them often.   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 Patient spends 6-10 hours with her 6 days a week.    Patient has a college education.   Patient is very active in her church.    Hobbies- outside crafts and church choir.   Social Determinants of Health   Financial Resource Strain: Low Risk  (09/17/2018)   Overall Financial Resource Strain (CARDIA)    Difficulty of Paying  Living Expenses: Not hard at all  Food Insecurity: No Food Insecurity (09/17/2018)   Hunger Vital Sign    Worried About Running Out of Food in the Last Year: Never true    Ran Out of Food in the Last Year: Never true  Transportation Needs: No Transportation Needs (09/17/2018)   PRAPARE - Administrator, Civil Service (Medical): No    Lack of Transportation (Non-Medical): No  Physical Activity: Insufficiently Active (09/17/2018)   Exercise Vital Sign    Days of Exercise per Week: 7 days    Minutes of Exercise per Session: 20 min  Stress: No Stress Concern Present (09/17/2018)   [employer] of Occupational Health - Occupational Stress Questionnaire    Feeling of Stress : Only a little  Social Connections: Moderately Integrated (09/17/2018)   Social Connection and Isolation Panel [NHANES]    Frequency of Communication with Friends and Family: Twice a week    Frequency of Social Gatherings with Friends and Family: More than three times a week    Attends Religious Services: 1 to 4 times per year    Active Member of [employer] or Organizations: No    Attends Banker Meetings: Never    Marital Status: Married  Catering manager Violence: Not At Risk (09/17/2018)   Humiliation, Afraid, Rape, and Kick questionnaire    Fear of Current or Ex-Partner: No    Emotionally Abused: No    Physically Abused: No    Sexually Abused: No      PHYSICAL EXAM  Vitals:   11/19/21 1121  BP: (!) 144/82  Pulse: 80  Weight: 183 lb 9.6 oz (83.3 kg)  Height: 5\' 3"  (1.6 m)   Body mass index is 32.52 kg/m.  Generalized: Well developed, in no acute distress  Chest: Lungs clear to auscultation bilaterally  Neurological examination  Mentation: Alert oriented to time, place, history taking. Follows all commands speech and language fluent Cranial nerve II-XII: Extraocular movements were full, visual field were full on confrontational test Head turning and shoulder shrug  were normal and  symmetric. Motor: The motor testing reveals 5 over 5 strength of all 4 extremities. Good symmetric motor tone is noted throughout.  Sensory: Sensory testing is intact to soft touch on all 4 extremities. No evidence of extinction is noted.  Gait and station: Gait is normal.    DIAGNOSTIC DATA (LABS, IMAGING, TESTING) - I reviewed patient records, labs, notes, testing and imaging myself where available.  Lab Results  Component Value Date   WBC 11.5 (H) 05/22/2020   HGB 13.6 05/22/2020   HCT 42.0 05/22/2020   MCV 84 05/22/2020   PLT 322 05/22/2020      Component Value Date/Time   NA 143 08/06/2021 1518   K 3.9 08/06/2021 1518   CL 102 08/06/2021 1518   CO2 25 08/06/2021 1518   GLUCOSE 99 08/06/2021 1518   GLUCOSE 135 (H) 08/29/2019 1030   BUN 15 08/06/2021 1518   CREATININE 1.21 (H) 08/06/2021 1518   CREATININE 1.10 (H) 02/25/2016 0907   CALCIUM 9.1 08/06/2021 1518   PROT 6.7 08/06/2021 1518   ALBUMIN 4.4 08/06/2021 1518   AST 25 08/06/2021 1518   ALT 23 08/06/2021 1518   ALKPHOS 118 08/06/2021 1518   BILITOT 0.3 08/06/2021 1518   GFRNONAA >60 08/29/2019 1030   GFRNONAA 53 (L) 02/25/2016 0907   GFRAA >60 08/29/2019 1030   GFRAA 61 02/25/2016 0907   Lab Results  Component Value Date   CHOL 143 08/06/2021   HDL 61 08/06/2021   LDLCALC 59 08/06/2021   LDLDIRECT 156 (H) 03/27/2017   TRIG 133 08/06/2021   CHOLHDL 2.3 08/06/2021   Lab Results  Component Value Date   HGBA1C 5.5 07/30/2017   Lab Results  Component Value Date   VITAMINB12 290 07/21/2017   Lab Results  Component Value Date   TSH 1.610 11/03/2018      ASSESSMENT AND PLAN 71 y.o. year old female  has a past medical history of Anemia, Cervical neuritis, Chronic kidney disease, Dyslipidemia, GERD (gastroesophageal reflux disease), History of transient ischemic attack (TIA), HTN (hypertension), Hyperlipidemia, Migraine, Migraine headache, Occipital neuralgia, Pulmonary embolism ([facility]) (2017), Renal  insufficiency, and Stroke ([facility]) (10/2013). here with:  OSA on CPAP  - CPAP compliance suboptimal - Good treatment of AHI  - Encourage patient to use CPAP nightly and > 4 hours each night - Mask refitting ordered - F/U in 1 year or sooner if needed    Butch Penny, MSN, NP-C 11/19/2021, 11:38 AM [facility] [address], [address] [place], [place] [address] [phone]

## 2021-11-19 ENCOUNTER — Ambulatory Visit (INDEPENDENT_AMBULATORY_CARE_PROVIDER_SITE_OTHER): Payer: Medicare Other | Admitting: Adult Health

## 2021-11-19 ENCOUNTER — Encounter: Payer: Self-pay | Admitting: Adult Health

## 2021-11-19 VITALS — BP 144/82 | HR 80 | Ht 63.0 in | Wt 183.6 lb

## 2021-11-19 DIAGNOSIS — G4733 Obstructive sleep apnea (adult) (pediatric): Secondary | ICD-10-CM

## 2021-11-19 DIAGNOSIS — Z9989 Dependence on other enabling machines and devices: Secondary | ICD-10-CM

## 2021-11-19 DIAGNOSIS — I25118 Atherosclerotic heart disease of native coronary artery with other forms of angina pectoris: Secondary | ICD-10-CM

## 2021-11-20 ENCOUNTER — Telehealth: Payer: Self-pay

## 2021-11-20 NOTE — Telephone Encounter (Signed)
Cpap order has been faxed to DME on file [place].

## 2021-11-24 ENCOUNTER — Other Ambulatory Visit: Payer: Self-pay | Admitting: Physical Medicine & Rehabilitation

## 2021-12-24 ENCOUNTER — Encounter: Payer: Medicare Other | Attending: Physical Medicine & Rehabilitation | Admitting: Physical Medicine & Rehabilitation

## 2021-12-24 ENCOUNTER — Encounter: Payer: Self-pay | Admitting: Physical Medicine & Rehabilitation

## 2021-12-24 VITALS — BP 145/82 | HR 83 | Temp 98.6°F | Ht 63.0 in | Wt 184.0 lb

## 2021-12-24 DIAGNOSIS — G8929 Other chronic pain: Secondary | ICD-10-CM | POA: Insufficient documentation

## 2021-12-24 DIAGNOSIS — M533 Sacrococcygeal disorders, not elsewhere classified: Secondary | ICD-10-CM | POA: Insufficient documentation

## 2021-12-24 MED ORDER — LIDOCAINE HCL 1 % IJ SOLN
10.0000 mL | Freq: Once | INTRAMUSCULAR | Status: AC
  Administered 2021-12-24: 10 mL

## 2021-12-24 MED ORDER — LIDOCAINE HCL (PF) 2 % IJ SOLN
5.0000 mL | Freq: Once | INTRAMUSCULAR | Status: AC
  Administered 2021-12-24: 5 mL

## 2021-12-24 NOTE — Progress Notes (Signed)
Right Sacroiliac radio frequency ablation under fluoroscopic guidance This consists of  Right L5 dorsal ramus radio frequency ablation plus RF of  Right  S1-S2 -S3 lateral branches  Indication is sacroiliac pain which has improved temporarily onby at least 50% following sacroiliac intra-articular injection and L5 , S1,2,3 Lateral branch blocks under fluoroscopic guidance. Pain interferes with self-care and mobility and has failed to respond to conservative measures.  Informed consent was obtained after discussing risks and benefits of the procedure with the patient these include bleeding bruising and infection temporary or permanent paralysis. The patient elects to proceed and has given written consent.  Patient placed prone on fluoroscopy table. Area marked and prepped with Betadine. Fluoroscopic images utilized to guide needle. 25-gauge 1.5 inch needle was used to anesthetize 4 injection points with 2 cc of 1% lidocaine each. Then a 18-gauge 10 cm RF needle with a 10 mm curved active tip was inserted under fluoroscopic guidance first targeting the S1 SA P./sacral ala junction, bone contact made and confirmed with lateral imaging.  motor stimulation at 2 Hz confirm proper needle location followed by injection of one cc of a solution containing   2% lidocaine MPF. Radio frequency 80C for 80 seconds was performed. Then the inferolateral aspect of the S1, S2 and lateral aspect of S3 sacral foramina were targeted. Bone contact made.  motor stim at 2 Hz confirm proper needle location. One ML of the /lidocaine solution was injected into each of 3 sites and radio frequency ablation 80C for 90 seconds was performed. Patient tolerated procedure well. Post procedure instructions given   Still using tramadol from July Rx will not prescribe additional at this time

## 2021-12-24 NOTE — Patient Instructions (Signed)
You had a radio frequency procedure today This was done to alleviate joint pain in your lumbar area We injected lidocaine which is a local anesthetic.  You may experience soreness at the injection sites. You may also experienced some irritation of the nerves that were heated I'm recommending ice for 30 minutes every 2 hours as needed for the next 24-48 hours   

## 2021-12-24 NOTE — Progress Notes (Signed)
  PROCEDURE RECORD [facility]   Name: [name] DOB:01-Jan-1951 MRN: [id number]  Date:12/24/2021  Physician: Alysia Penna, MD    Nurse/CMA: Truman Hayward, CMA  Allergies:  Allergies  Allergen Reactions   Iodinated Contrast Media Shortness Of Breath and Itching    States she had this reaction connected with a MRI of the cervical spine.   Shellfish Allergy Anaphylaxis    All seafood   Crestor [Rosuvastatin]     Anxiety, itching, headaches, myalgias   Iodine    Lipitor [Atorvastatin]     Anxiety, itching, headaches, myalgias   Statins Other (See Comments)    Arthralgias (severe) with atorvastatin, mild arthralgias with rosuvastatin but willing to continue taking it    Consent Signed: Yes.    Is patient diabetic? No.  CBG today? .  Pregnant: No. LMP: No LMP recorded. Patient has had a hysterectomy. (age 1-55)  Anticoagulants: yes (Plavix, stopped 3 days ago) Anti-inflammatory: no Antibiotics: no  Procedure: Right Sacroiliac Radiofrequency  Position: Prone Start Time: 1:52 pm  End Time: 1:  Fluoro Time: 75  RN/CMA Truman Hayward, CMA Tierre Gerard, CMA    Time 1:37 pm 2:15 pm    BP 145/82 164/92    Pulse 83 91    Respirations 16 16    O2 Sat 94 95    S/S 6 6    Pain Level 8/10 0/10     D/C home with no one, [name] & O X 3, D/C instructions reviewed, and sits independently.

## 2022-01-01 ENCOUNTER — Other Ambulatory Visit: Payer: Self-pay | Admitting: Student

## 2022-01-07 ENCOUNTER — Ambulatory Visit
Admission: RE | Admit: 2022-01-07 | Discharge: 2022-01-07 | Disposition: A | Discharge status: Home / Self Care | Payer: Medicare Other | Source: Ambulatory Visit | Attending: Family Medicine | Admitting: Family Medicine

## 2022-01-07 ENCOUNTER — Ambulatory Visit (INDEPENDENT_AMBULATORY_CARE_PROVIDER_SITE_OTHER): Payer: Medicare Other | Admitting: Student

## 2022-01-07 VITALS — BP 148/82 | HR 87 | Temp 98.1°F | Wt 182.0 lb

## 2022-01-07 DIAGNOSIS — R059 Cough, unspecified: Secondary | ICD-10-CM | POA: Diagnosis not present

## 2022-01-07 DIAGNOSIS — N12 Tubulo-interstitial nephritis, not specified as acute or chronic: Secondary | ICD-10-CM

## 2022-01-07 DIAGNOSIS — I25118 Atherosclerotic heart disease of native coronary artery with other forms of angina pectoris: Secondary | ICD-10-CM

## 2022-01-07 DIAGNOSIS — R052 Subacute cough: Secondary | ICD-10-CM

## 2022-01-07 DIAGNOSIS — R300 Dysuria: Secondary | ICD-10-CM | POA: Diagnosis not present

## 2022-01-07 LAB — POCT URINALYSIS DIP (MANUAL ENTRY)
Bilirubin, UA: NEGATIVE
Blood, UA: NEGATIVE
Glucose, UA: NEGATIVE mg/dL
Ketones, POC UA: NEGATIVE mg/dL
Leukocytes, UA: NEGATIVE
Nitrite, UA: POSITIVE — AB
Protein Ur, POC: NEGATIVE mg/dL
Spec Grav, UA: 1.015 (ref 1.010–1.025)
Urobilinogen, UA: 0.2 E.U./dL
pH, UA: 6 (ref 5.0–8.0)

## 2022-01-07 MED ORDER — CEFTRIAXONE SODIUM 1 G IJ SOLR
1.0000 g | Freq: Once | INTRAMUSCULAR | Status: AC
  Administered 2022-01-07: 1 g via INTRAMUSCULAR

## 2022-01-07 MED ORDER — CEPHALEXIN 500 MG PO CAPS: 500.0000 mg | ORAL_CAPSULE | Freq: Four times a day (QID) | ORAL | 0 refills | Status: AC

## 2022-01-07 NOTE — Patient Instructions (Addendum)
[name],  It is such a joy to meet you today!  I am so sorry that you are feeling yucky.  I think you may have 2 things going on.  I am concerned for a repeat kidney infection.  Therefore I am going to treat you for one.  We are giving you a shot of ceftriaxone in clinic today and then I am sending you home with a prescription for Keflex to start tomorrow.  This should be taken 4 times daily for 7 days. I will see you back on Thursday to make sure you are doing okay.  In the meantime if you have fevers, you can take Tylenol as needed.  I also want you to be sure that you are drinking as many fluids as you can to help keep yourself hydrated and keep things flowing through your kidneys. I want you to stop at the [facility] imaging office at [facility] on your way home to have a chest x-ray done.  We can discuss the results when I see you on Thursday.  [name], MD

## 2022-01-07 NOTE — Progress Notes (Unsigned)
r 

## 2022-01-07 NOTE — Progress Notes (Unsigned)
    SUBJECTIVE:   CHIEF COMPLAINT / HPI:   Urinary Frequency and Flank Pain, Concern for Pyelonephritis Patient has a history of pyelonephritis ~4 years ago and grew E coli. She is here today because she is concerned for a recurrence of her pyelonephritis with urinary frequency, occasional dysuria, and severe flank pain since Saturday. She also has had some chills though has not documented a fever. She denies any suprapubic pain. 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   Subacute Cough Present for ~6 weeks. Non-productive. Interfering with sleep. No fevers/chills until she developed her urinary symptoms as discussed above. She was previously controlling her symptoms with Mucinex but now feels that nothing is helping despite trials of many OTC options. She does have a 30 pack year smoking history. Has never had a CT chest.    OBJECTIVE:   BP (!) 148/82   Pulse 87   Temp 98.1 F (36.7 C)   Wt 182 lb (82.6 kg)   SpO2 100%   BMI 32.24 kg/m   Gen: Tired appearing but non-toxic, coughing frequently throughout visit HENT: Without cervical lymphadenopathy, mucous membranes moist, oropharynx clear Cardio: RRR, no m/r/g Pulm: Normal WOB on RA, coarse breath sounds and diminished air movement on L compared to R Abd: Soft, marked CVA tenderness bilaterally, without suprapubic tenderness  UA with pos nitrites, neg leukocytes. Spec Grav 1.015  ASSESSMENT/PLAN:   Pyelonephritis While her UA is indeterminate for UTI, her constellation of symptoms and history of pyelonephritis paints a concerning picture for recurrence of her pyelonephritis and I will treat her as such. Given her concurrent pulmonary symptoms, consider that her chills may be related to a pulmonary source if urine culture is negative.  - CTX x1 in clinic today followed by Keflex x7 days -  Follow urine culture - BMP to evaluate for AKI - Encourage fluids - Will have back in 2 days - If not improving rapidly with abx, will order renal US at follow-up to rule out obstructive process  Subacute cough 6 weeks of cough not responding to OTC options. Given smoking history and abnormal lung exam, I think it is reasonable to move forward with a CXR today. Patient is getting antibiotics for her urinary symptoms as above which should be sufficient to treat the majority of respiratory pathogens.  - Will need CT Chest eventually. If CXR concerning will order diagnostic study. If CXR is negative, will order screening study. - Will discuss further at follow-up in two days     Courtney Dubonnet, MD [place]

## 2022-01-08 LAB — BASIC METABOLIC PANEL
BUN/Creatinine Ratio: 15 (ref 12–28)
BUN: 18 mg/dL (ref 8–27)
CO2: 28 mmol/L (ref 20–29)
Calcium: 9.2 mg/dL (ref 8.7–10.3)
Chloride: 101 mmol/L (ref 96–106)
Creatinine, Ser: 1.2 mg/dL — ABNORMAL HIGH (ref 0.57–1.00)
Glucose: 90 mg/dL (ref 70–99)
Potassium: 3.9 mmol/L (ref 3.5–5.2)
Sodium: 141 mmol/L (ref 134–144)
eGFR: 49 mL/min/{1.73_m2} — ABNORMAL LOW (ref >59)

## 2022-01-08 NOTE — Assessment & Plan Note (Signed)
6 weeks of cough not responding to OTC options. Given smoking history and abnormal lung exam, I think it is reasonable to move forward with a CXR today. Patient is getting antibiotics for her urinary symptoms as above which should be sufficient to treat the majority of respiratory pathogens.  - Will need CT Chest eventually. If CXR concerning will order diagnostic study. If CXR is negative, will order screening study. - Will discuss further at follow-up in two days

## 2022-01-08 NOTE — Assessment & Plan Note (Addendum)
While her UA is indeterminate for UTI, her constellation of symptoms and history of pyelonephritis paints a concerning picture for recurrence of her pyelonephritis and I will treat her as such. Given her concurrent pulmonary symptoms, consider that her chills may be related to a pulmonary source if urine culture is negative.  - CTX x1 in clinic today followed by Keflex x7 days - Follow urine culture - BMP to evaluate for AKI - Encourage fluids - Will have back in 2 days - If not improving rapidly with abx, will order renal US at follow-up to rule out obstructive process

## 2022-01-09 ENCOUNTER — Other Ambulatory Visit: Payer: Self-pay

## 2022-01-09 ENCOUNTER — Ambulatory Visit (INDEPENDENT_AMBULATORY_CARE_PROVIDER_SITE_OTHER): Payer: Medicare Other | Admitting: Student

## 2022-01-09 ENCOUNTER — Encounter: Payer: Self-pay | Admitting: Student

## 2022-01-09 VITALS — BP 157/87 | HR 92 | Wt 183.8 lb

## 2022-01-09 DIAGNOSIS — I25118 Atherosclerotic heart disease of native coronary artery with other forms of angina pectoris: Secondary | ICD-10-CM

## 2022-01-09 DIAGNOSIS — Z122 Encounter for screening for malignant neoplasm of respiratory organs: Secondary | ICD-10-CM | POA: Diagnosis not present

## 2022-01-09 DIAGNOSIS — R052 Subacute cough: Secondary | ICD-10-CM | POA: Diagnosis not present

## 2022-01-09 DIAGNOSIS — F17211 Nicotine dependence, cigarettes, in remission: Secondary | ICD-10-CM | POA: Diagnosis not present

## 2022-01-09 DIAGNOSIS — N12 Tubulo-interstitial nephritis, not specified as acute or chronic: Secondary | ICD-10-CM | POA: Diagnosis not present

## 2022-01-09 NOTE — Assessment & Plan Note (Signed)
Doing better s/p a few days of abx. Afebrile. No evidence of AKI on BMP. Urine culture remains pending. - Continue Keflex for 7 day course - Follow-up urine culture

## 2022-01-09 NOTE — Patient Instructions (Addendum)
[name], I'm so glad you are feeling better! Your exam today is reassuring. Let's continue the antibiotics for the full week. If your symptoms persist beyond that time, please come back to see [place].  I have ordered a CT scan of your chest, you should get a call from [place] once this has been approved by your insurance to schedule it.  Make an appointment in the next few weeks to check back in regarding your blood pressure--it may just be up today in the setting of acute illness.  [name], MD

## 2022-01-09 NOTE — Progress Notes (Signed)
    SUBJECTIVE:   CHIEF COMPLAINT / HPI:   Pyelonephritis Seen in clinic two days ago with urinary frequency and bilateral flank pain. UA with nitrites but no leukocytes. Plan was to treat her for pyelo and bring her back today to monitor improvement. She is s/p Rocephin x1 and started a course of Keflex yesterday. She has not had any further fever/chills since our last visit. She feels that her back pain is improving. Urine culture has not resulted yet. BMP obtained at last visit was reassuring in that it did not show evidence of an AKI.   Smoking History  Subacute Cough At last visit patient also complained of a subacute cough x6 weeks. She had diminished sounds and crackles on L so CXR was obtained which was read WNL. Patient with a 30 pack year history, quit 10 years ago. Has never had advanced imaging of the chest.     OBJECTIVE:   BP (!) 157/87   Pulse 92   Wt 183 lb 12.8 oz (83.4 kg)   SpO2 99%   BMI 32.56 kg/m   Physical Exam Vitals reviewed.  Constitutional:      General: She is not in acute distress. Cardiovascular:     Rate and Rhythm: Normal rate and regular rhythm.     Heart sounds: No murmur heard. Pulmonary:     Comments: Normal WOB on RA, breath sounds diminished and with crackles in L lung fields. Abdominal:     General: There is no distension.     Tenderness: There is no abdominal tenderness.     Comments: No CVA tenderness today, improved from my exam two days ago  Neurological:     General: No focal deficit present.      ASSESSMENT/PLAN:   Pyelonephritis Doing better s/p a few days of abx. Afebrile. No evidence of AKI on BMP. Urine culture remains pending. - Continue Keflex for 7 day course - Follow-up urine culture  Subacute cough Normal CXR. Reassured by normal SpO2. However, remains with abnormal exam and cough. Could consider bronchitis, but she is already receiving antibiotics. Given heavy smoking history and quitting <15years ago, would  benefit from low-dose CT Chest to screen for lung CA.  - Low-dose CT Chest - F/u with PCP if no improvement     [name], MD [place]

## 2022-01-09 NOTE — Assessment & Plan Note (Addendum)
Normal CXR. Reassured by normal SpO2. However, remains with abnormal exam and cough. Could consider bronchitis, but she is already receiving antibiotics. Given heavy smoking history and quitting <15years ago, would benefit from low-dose CT Chest to screen for lung CA.  - Low-dose CT Chest - F/u with PCP if no improvement

## 2022-01-10 ENCOUNTER — Telehealth: Payer: Self-pay

## 2022-01-10 LAB — URINE CULTURE

## 2022-01-10 NOTE — Telephone Encounter (Signed)
Left message for patient informing of upcoming appointment.  CT Chest Low Cancer screen 01/17/2022 [address] [address] [id number] with arrival [address]  .Ozella Almond, CMA

## 2022-01-13 NOTE — Telephone Encounter (Signed)
Patient returns call to nurse line.   She reports this time will not work for her. She works until 12pm most days.   Patient given number to call and reschedule.

## 2022-01-15 ENCOUNTER — Other Ambulatory Visit: Payer: Self-pay | Admitting: Student

## 2022-01-15 ENCOUNTER — Encounter: Payer: Self-pay | Admitting: Student

## 2022-01-15 MED ORDER — TRAZODONE HCL 50 MG PO TABS: ORAL_TABLET | ORAL | 0 refills | Status: DC

## 2022-01-15 MED ORDER — CETIRIZINE HCL 10 MG PO TABS: 10.0000 mg | ORAL_TABLET | Freq: Every day | ORAL | 0 refills | Status: AC

## 2022-01-15 MED ORDER — FUROSEMIDE 40 MG PO TABS: 40.0000 mg | ORAL_TABLET | Freq: Every day | ORAL | 2 refills | Status: DC

## 2022-01-17 ENCOUNTER — Ambulatory Visit (HOSPITAL_COMMUNITY)
Admission: RE | Admit: 2022-01-17 | Discharge: 2022-01-17 | Disposition: A | Discharge status: Home / Self Care | Payer: Medicare Other | Source: Ambulatory Visit | Attending: Family Medicine | Admitting: Family Medicine

## 2022-01-17 ENCOUNTER — Other Ambulatory Visit: Payer: Self-pay | Admitting: Student

## 2022-01-17 DIAGNOSIS — Z122 Encounter for screening for malignant neoplasm of respiratory organs: Secondary | ICD-10-CM | POA: Diagnosis not present

## 2022-01-17 DIAGNOSIS — Z87891 Personal history of nicotine dependence: Secondary | ICD-10-CM | POA: Diagnosis not present

## 2022-01-17 DIAGNOSIS — F17211 Nicotine dependence, cigarettes, in remission: Secondary | ICD-10-CM | POA: Diagnosis not present

## 2022-01-19 ENCOUNTER — Other Ambulatory Visit: Payer: Self-pay | Admitting: Student

## 2022-01-21 ENCOUNTER — Encounter: Payer: Self-pay | Admitting: Student

## 2022-01-21 ENCOUNTER — Ambulatory Visit (INDEPENDENT_AMBULATORY_CARE_PROVIDER_SITE_OTHER): Payer: Medicare Other | Admitting: Student

## 2022-01-21 VITALS — BP 150/88 | HR 68 | Ht 63.0 in | Wt 180.0 lb

## 2022-01-21 DIAGNOSIS — R053 Chronic cough: Secondary | ICD-10-CM

## 2022-01-21 DIAGNOSIS — R109 Unspecified abdominal pain: Secondary | ICD-10-CM | POA: Diagnosis not present

## 2022-01-21 DIAGNOSIS — R300 Dysuria: Secondary | ICD-10-CM

## 2022-01-21 DIAGNOSIS — I10 Essential (primary) hypertension: Secondary | ICD-10-CM

## 2022-01-21 DIAGNOSIS — I25118 Atherosclerotic heart disease of native coronary artery with other forms of angina pectoris: Secondary | ICD-10-CM

## 2022-01-21 DIAGNOSIS — Z23 Encounter for immunization: Secondary | ICD-10-CM

## 2022-01-21 LAB — POCT URINALYSIS DIP (MANUAL ENTRY)
Bilirubin, UA: NEGATIVE
Blood, UA: NEGATIVE
Glucose, UA: NEGATIVE mg/dL
Ketones, POC UA: NEGATIVE mg/dL
Leukocytes, UA: NEGATIVE
Nitrite, UA: NEGATIVE
Protein Ur, POC: NEGATIVE mg/dL
Spec Grav, UA: 1.02 (ref 1.010–1.025)
Urobilinogen, UA: 0.2 E.U./dL
pH, UA: 6 (ref 5.0–8.0)

## 2022-01-21 MED ORDER — LIDOCAINE 5 % EX PTCH: 1.0000 | MEDICATED_PATCH | CUTANEOUS | 0 refills | Status: DC

## 2022-01-21 MED ORDER — LOSARTAN POTASSIUM 25 MG PO TABS: 25.0000 mg | ORAL_TABLET | Freq: Every day | ORAL | 0 refills | Status: DC

## 2022-01-21 NOTE — Patient Instructions (Signed)
It was great to see you! Thank you for allowing me to participate in your care!  I recommend that you always bring your medications to each appointment as this makes it easy to ensure you are on the correct medications and helps [place] not miss when refills are needed.  Our plans for today:  - See Dr. Valentina Lucks next week for pulmonary function tests - Start taking Losartan in addition to your amlodipine for blood pressure -I sent your urine to be cultured -I ordered a renal ultrasound to be completed at [place] at [address]    Take care and seek immediate care sooner if you develop any concerns.   Dr. Precious Gilding, DO [facility]

## 2022-01-21 NOTE — Progress Notes (Unsigned)
    SUBJECTIVE:   CHIEF COMPLAINT / HPI:   Pyelonephritis Diagnosed on 10/31.  Patient completed a course of Keflex and Rocephin x1.  States her symptoms have improved although she is still having some R lower flank pain (was bilateral previously) and occasional dysuria.   Subacute cough Has been present for***weeks and patient has significant smoking history.  Chest CT was ordered and completed on 11/10 which showed evidence of GERD, aortic atherosclerosis, coronary artery atherosclerosis and emphysema  HTN  PERTINENT  PMH / PSH: ***  OBJECTIVE:   BP (!) 160/98   Pulse 68   Ht '5\' 3"'$  (1.6 m)   Wt 180 lb (81.6 kg)   SpO2 98%   BMI 31.89 kg/m  ***  General: NAD, pleasant, able to participate in exam Cardiac: RRR, no murmurs. Respiratory: CTAB, normal effort, No wheezes, rales or rhonchi Abdomen: Bowel sounds present, nontender, nondistended, no hepatosplenomegaly. Extremities: no edema or cyanosis. Skin: warm and dry, no rashes noted Neuro: alert, no obvious focal deficits Psych: Normal affect and mood  ASSESSMENT/PLAN:   No problem-specific Assessment & Plan notes found for this encounter.     Dr. Precious Gilding, [place]    {    This will disappear when note is signed, click to select method of visit    :1}

## 2022-01-22 ENCOUNTER — Other Ambulatory Visit: Payer: Self-pay | Admitting: Student

## 2022-01-22 ENCOUNTER — Ambulatory Visit (INDEPENDENT_AMBULATORY_CARE_PROVIDER_SITE_OTHER): Payer: Medicare Other | Admitting: Student

## 2022-01-22 VITALS — BP 160/98 | HR 91 | Ht 63.0 in | Wt 181.0 lb

## 2022-01-22 DIAGNOSIS — I25118 Atherosclerotic heart disease of native coronary artery with other forms of angina pectoris: Secondary | ICD-10-CM | POA: Diagnosis not present

## 2022-01-22 DIAGNOSIS — R300 Dysuria: Secondary | ICD-10-CM | POA: Insufficient documentation

## 2022-01-22 DIAGNOSIS — L989 Disorder of the skin and subcutaneous tissue, unspecified: Secondary | ICD-10-CM | POA: Diagnosis not present

## 2022-01-22 DIAGNOSIS — R109 Unspecified abdominal pain: Secondary | ICD-10-CM | POA: Insufficient documentation

## 2022-01-22 DIAGNOSIS — C4491 Basal cell carcinoma of skin, unspecified: Secondary | ICD-10-CM | POA: Insufficient documentation

## 2022-01-22 NOTE — Progress Notes (Signed)
    SUBJECTIVE:   CHIEF COMPLAINT / HPI: Arm lesion  Has been on her right arm for the past year. Did not notice any preceding trauma to the area. Denies any systemic symptoms of fevers/vomiting. Does intermittently bleed, she has to keep a band aid on it at all times. She is on plavix.  Says it has doubled in size in the past year and keeps growing. No other lesions over body.   PERTINENT  PMH / PSH: HTN, PVD  OBJECTIVE:   BP (!) 160/98   Pulse 91   Ht '5\' 3"'$  (1.6 m)   Wt 181 lb (82.1 kg)   SpO2 96%   BMI 32.06 kg/m    Gen: NAD, nontoxic  Skin right arm: Asymmetry-mild, overall symmetric Border-some jagged edges Color-no significant differences, some bleeding points Diameter-large Evolving-yes doubled in size in past year (Other lesion beside it is from band aid ripping skin off)      ASSESSMENT/PLAN:   Skin lesion Skin lesion on right upper arm with granulomatous appearance. Ddx includes pyogenic granuloma given appearance and bleeding. Less likely to be Kaposi's sarcoma or bacillary angiomatosis given no systemic involvement or symptoms. It has however grown significantly in past year. Could consider biopsy + silver nitrate application.  -Derm clinic appt scheduled   Gerrit Heck, MD [place]

## 2022-01-22 NOTE — Assessment & Plan Note (Signed)
Cough is now chronic as its been going on for 8 weeks.  CT can showed evidence of GERD which patient is known to have and was suggestive of emphysema which is possible considering patient's smoking history.  Both GERD or emphysema could be contributing to her cough.  I do not think her cough is infectious in nature as she does not have any fever, chills, it is not productive.  Patient agrees to see Dr. Valentina Lucks for pulmonary function tests to determine whether or not she has COPD.

## 2022-01-22 NOTE — Assessment & Plan Note (Signed)
Due to patient having elevated blood pressures despite being on amlodipine 5 mg daily she agrees to start a second agent. -Continue amlodipine 5 mg daily -Start losartan 25 mg daily -BMP at next visit

## 2022-01-22 NOTE — Assessment & Plan Note (Signed)
As patient's flank pain is similar to the pain she experienced when she was treated for pyelonephritis, I am concerned that the pain is still coming from her kidney and she could have a kidney stone.  In the event that a renal ultrasound is negative, we can consider that this pain may be musculoskeletal.  -Renal ultrasound -Lidocaine patches prn

## 2022-01-22 NOTE — Assessment & Plan Note (Signed)
Dysuria is mild and intermittent.  With history of pyelonephritis and the fact that she is still having right flank pain, I will send her urine off for culture although the dipstick was negative for UTI in the office today.

## 2022-01-22 NOTE — Assessment & Plan Note (Signed)
Skin lesion on right upper arm with granulomatous appearance. Ddx includes pyogenic granuloma given appearance and bleeding. Less likely to be Kaposi's sarcoma or bacillary angiomatosis given no systemic involvement or symptoms. It has however grown significantly in past year. Could consider biopsy + silver nitrate application.  -Derm clinic appt scheduled

## 2022-01-22 NOTE — Patient Instructions (Addendum)
It was great to see you! Thank you for allowing me to participate in your care!   Our plans for today:  - We will see you back on December 14th at 2:30 PM in dermatology clinic for a procedure and additional evaluation  Take care and seek immediate care sooner if you develop any concerns.  Gerrit Heck, MD

## 2022-01-23 ENCOUNTER — Telehealth: Payer: Self-pay

## 2022-01-23 ENCOUNTER — Ambulatory Visit: Payer: Medicare Other

## 2022-01-23 LAB — URINE CULTURE

## 2022-01-23 NOTE — Telephone Encounter (Signed)
Prior Auth for patients medication LIDOCAINE 5% PATCHES denied by [employer] via CoverMyMeds.   LIDOCAINE 4% PATCHES ARE AVAILABLE OTC  CoverMyMeds Key: [id number]

## 2022-01-23 NOTE — Telephone Encounter (Signed)
A Prior Authorization was initiated for this patients LIDOCAINE 5% PATCHES through CoverMyMeds.   Key: [id number]

## 2022-01-24 ENCOUNTER — Other Ambulatory Visit: Payer: Self-pay

## 2022-01-24 ENCOUNTER — Encounter: Payer: Self-pay | Admitting: Student

## 2022-01-24 MED ORDER — TIZANIDINE HCL 4 MG PO TABS: 4.0000 mg | ORAL_TABLET | Freq: Four times a day (QID) | ORAL | 2 refills | Status: DC | PRN

## 2022-01-28 ENCOUNTER — Encounter: Payer: Self-pay | Admitting: Pharmacist

## 2022-01-28 ENCOUNTER — Ambulatory Visit (INDEPENDENT_AMBULATORY_CARE_PROVIDER_SITE_OTHER): Payer: Medicare Other | Admitting: Pharmacist

## 2022-01-28 ENCOUNTER — Encounter: Payer: Medicare Other | Attending: Physical Medicine & Rehabilitation | Admitting: Physical Medicine & Rehabilitation

## 2022-01-28 ENCOUNTER — Encounter: Payer: Self-pay | Admitting: Physical Medicine & Rehabilitation

## 2022-01-28 VITALS — BP 150/82 | HR 97 | Ht 65.0 in | Wt 180.6 lb

## 2022-01-28 VITALS — BP 118/73 | HR 90 | Ht 65.0 in | Wt 182.4 lb

## 2022-01-28 DIAGNOSIS — I25118 Atherosclerotic heart disease of native coronary artery with other forms of angina pectoris: Secondary | ICD-10-CM

## 2022-01-28 DIAGNOSIS — G588 Other specified mononeuropathies: Secondary | ICD-10-CM | POA: Diagnosis not present

## 2022-01-28 DIAGNOSIS — R0602 Shortness of breath: Secondary | ICD-10-CM

## 2022-01-28 DIAGNOSIS — M533 Sacrococcygeal disorders, not elsewhere classified: Secondary | ICD-10-CM | POA: Diagnosis not present

## 2022-01-28 DIAGNOSIS — G8929 Other chronic pain: Secondary | ICD-10-CM | POA: Diagnosis not present

## 2022-01-28 NOTE — Progress Notes (Signed)
   S:     Chief Complaint  Patient presents with   Medication Management    PFT - Cough X 6 months   [name] is a 71 y.o. female who presents for lung function evaluation.  PMH is significant for smoking 1.5-2 ppd of [place] menthols from age 14 to age 28.  Patient was referred and last seen by Primary Care Provider, Dr. Ronnald Ramp, on 01/21/2022.   Patient reports she had a significant illness in the spring which kept her in bed with symptoms for 5 days.  She has had a persistent cough since that illness > 6 months ago.   Medication adherence reports using albuterol PRN about 5 days per week, usually in the AM if needed and shared that it provides minimal symptom relief.  Current COPD medications: none   O:  Dyspnea with cough and modest amount of sputum production - unable to clear her congested feeling from her lungs Physical Exam Vitals reviewed.  Neurological:     Mental Status: She is alert.  Psychiatric:        Mood and Affect: Mood normal.        Behavior: Behavior normal.        Thought Content: Thought content normal.        Judgment: Judgment normal.     Vitals:   01/28/22 0927  BP: 118/73  Pulse: 90  SpO2: 95%    CAT score= 31/40 (highly symptomatic) See Documentation Flowsheet - CAT/COPD for complete symptom scoring.  See "scanned report" or Documentation Flowsheet (discrete results - PFTs) for Spirometry results. Patient provided good effort while attempting spirometry.   Lung Age = 12 Albuterol Neb  Lot# [id number]     Exp. 04/2023  A/P: Patient has been experiencing dysnea with cough following illness ~ 6 months prior.  Cough has continued with minimal improvement with a wide array of treatments. 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 Post nebulized albuterol tx revealed minimal change.  Significant symptoms with CAT score of 31/40.   -no change to current - albuterol PRN -Reviewed results of pulmonary function tests.  Pt  verbalized understanding of results and education.    Written patient instructions provided.   Total time in face to face counseling 43 minutes.    Follow-up:  Pharmacist none scheduled at this time. PCP clinic visit following this test - patient will schedule.

## 2022-01-28 NOTE — Progress Notes (Signed)
Subjective:    Patient ID: [name], female    DOB: [birth year], 71 y.o.   MRN: [id number]  HPI  Pt has no pain except at night, just takes 1 tramadol at bedtime.  12/24/2021 Right Sacroiliac radio frequency ablation under fluoroscopic guidance This consists of  Right L5 dorsal ramus radio frequency ablation plus RF of  Right  S1-S2 -S3 lateral branches  We reviewed that prior sacroiliac radiofrequency procedures have lasted around 6 months for her.  The patient remains independent with all self-care and mobility.  She is assisting with Thanksgiving meal.  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 General activity 6 Relation with others 6 Enjoyment of life 6 What TIME of day is your pain at its worst? morning , evening, and night Sleep (in general) Poor  Pain is worse with: walking, bending, and standing Pain improves with: rest, heat/ice, and medication Relief from Meds: 6  Family History  Problem Relation Age of Onset   Cancer Mother 770      breast   Alzheimer's disease Mother    Prostate cancer Father    Lung cancer Father    Cancer Brother    Hyperlipidemia Brother    Sleep apnea Brother    Cancer Brother    Hyperlipidemia Brother    Dementia Brother        frontotemporal dementia   Dementia Maternal Grandmother    Cancer Maternal Grandfather    Social History   Socioeconomic History   Marital status: Married    Spouse name: [name]   Number of children: 1   Years of education: 12+   Highest education level: High school graduate  Occupational History   Occupation: ACTIVITY DIRECTOR    Employer: [employer]   Occupation: CNA  Tobacco Use   Smoking status: Former    Packs/day: 1.50    Years: 42.00    Total pack years: 63.00     Types: Cigarettes    Start date: 03/10/1969    Quit date: 11/01/2011    Years since quitting: 10.2   Smokeless tobacco: Never   Tobacco comments:    Smoked 1.5-2 ppd for adult life (sal[place] menthols)  Vaping Use   Vaping Use: Never used  Substance and Sexual Activity   Alcohol use: No    Alcohol/week: 0.0 standard drinks of alcohol   Drug use: No   Sexual activity: Yes    Partners: Male    Birth control/protection: Surgical    Comment: 1 sexual partner in last 12 months: married  Other Topics Concern   Not on file  Social History Narrative    Patient is married ([name]).   Patient has one child and grandchild. Her sons name is DShanon Brow Patient sees them often.   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 Patient spends 6-10 hours with her 6 days a week.    Patient has a college education.   Patient is very active in her church.    Hobbies- outside crafts and church choir.   Social Determinants of Health   Financial Resource Strain: Low Risk  (09/17/2018)   Overall Financial Resource Strain (CARDIA)    Difficulty of Paying Living Expenses: Not hard at all  Food Insecurity: No Food Insecurity (09/17/2018)   Hunger Vital Sign    Worried About Running Out of Food in the Last Year: Never true    Ran Out of Food in the Last Year: Never true  Transportation Needs: No Transportation Needs (09/17/2018)   PRAPARE - Hydrologist (Medical): No    Lack of Transportation (Non-Medical): No  Physical Activity: Insufficiently Active (09/17/2018)   Exercise Vital Sign    Days of Exercise per Week: 7 days    Minutes of Exercise per Session: 20 min  Stress: No Stress Concern Present (09/17/2018)   [place]    Feeling of Stress : Only a little  Social Connections: Moderately Integrated (09/17/2018)   Social Connection and Isolation Panel [NHANES]    Frequency of  Communication with Friends and Family: Twice a week    Frequency of Social Gatherings with Friends and Family: More than three times a week    Attends Religious Services: 1 to 4 times per year    Active Member of [employer] or Organizations: No    Attends Archivist Meetings: Never    Marital Status: Married   Past Surgical History:  Procedure Laterality Date   ABDOMINAL HYSTERECTOMY     ANTERIOR CERVICAL DECOMP/DISCECTOMY FUSION N/A 05/24/2020   Procedure: ANTERIOR CERVICAL DECOMPRESSION/DISCECTOMY FUSION 1 LEVEL C3-4;  Surgeon: Melina Schools, MD;  Location: [place];  Service: Orthopedics;  Laterality: N/A;  3 hrs   BILIARY STENT PLACEMENT N/A 12/12/2015   Procedure: BILIARY STENT PLACEMENT;  Surgeon: Doran Stabler, MD;  Location: [place];  Service: Endoscopy;  Laterality: N/A;   BLADDER REPAIR     CARPAL TUNNEL RELEASE     CERVICAL FUSION     CERVICAL SPINE SURGERY     CHOLECYSTECTOMY N/A 12/06/2015   Procedure: LAPAROSCOPIC CHOLECYSTECTOMY WITH  INTRAOPERATIVE CHOLANGIOGRAM;  Surgeon: Stark Klein, MD;  Location: [place];  Service: General;  Laterality: N/A;   ELBOW SURGERY     ERCP N/A 12/12/2015   Procedure: ENDOSCOPIC RETROGRADE CHOLANGIOPANCREATOGRAPHY (ERCP);  Surgeon: Doran Stabler, MD;  Location: [facility] ENDOSCOPY;  Service: Endoscopy;  Laterality: N/A;   ESOPHAGOGASTRODUODENOSCOPY N/A 01/28/2016   Procedure: ESOPHAGOGASTRODUODENOSCOPY (EGD) Biliary STENT removal;  Surgeon: Doran Stabler, MD;  Location: [facility] ENDOSCOPY;  Service: Gastroenterology;  Laterality: N/A;   IR GENERIC HISTORICAL  12/17/2015   IR US GUIDE BX ASP/DRAIN 12/17/2015 [facility]   IR GENERIC HISTORICAL  12/17/2015   IR GUIDED DRAIN W CATHETER PLACEMENT 12/17/2015 [facility]   IR GENERIC HISTORICAL  12/17/2015   IR SINUS/FIST TUBE CHK-NON GI 12/17/2015 [facility]   KNEE SURGERY     LUNG SURGERY     TEE WITHOUT CARDIOVERSION N/A 12/19/2015   Procedure: TRANSESOPHAGEAL ECHOCARDIOGRAM (TEE);   Surgeon: Josue Hector, MD;  Location: [facility] ENDOSCOPY;  Service: Cardiovascular;  Laterality: N/A;   Past Surgical History:  Procedure Laterality Date   ABDOMINAL HYSTERECTOMY     ANTERIOR CERVICAL DECOMP/DISCECTOMY FUSION N/A 05/24/2020   Procedure: ANTERIOR CERVICAL DECOMPRESSION/DISCECTOMY FUSION 1 LEVEL C3-4;  Surgeon: Melina Schools, MD;  Location: [place];  Service: Orthopedics;  Laterality: N/A;  3 hrs   BILIARY STENT PLACEMENT N/A 12/12/2015   Procedure: BILIARY STENT PLACEMENT;  Surgeon: Doran Stabler, MD;  Location: [place];  Service: Endoscopy;  Laterality: N/A;   BLADDER REPAIR     CARPAL TUNNEL RELEASE     CERVICAL  FUSION     CERVICAL SPINE SURGERY     CHOLECYSTECTOMY N/A 12/06/2015   Procedure: LAPAROSCOPIC CHOLECYSTECTOMY WITH  INTRAOPERATIVE CHOLANGIOGRAM;  Surgeon: Stark Klein, MD;  Location: [place];  Service: General;  Laterality: N/A;   ELBOW SURGERY     ERCP N/A 12/12/2015   Procedure: ENDOSCOPIC RETROGRADE CHOLANGIOPANCREATOGRAPHY (ERCP);  Surgeon: Doran Stabler, MD;  Location: [facility] ENDOSCOPY;  Service: Endoscopy;  Laterality: N/A;   ESOPHAGOGASTRODUODENOSCOPY N/A 01/28/2016   Procedure: ESOPHAGOGASTRODUODENOSCOPY (EGD) Biliary STENT removal;  Surgeon: Doran Stabler, MD;  Location: [facility] ENDOSCOPY;  Service: Gastroenterology;  Laterality: N/A;   IR GENERIC HISTORICAL  12/17/2015   IR US GUIDE BX ASP/DRAIN 12/17/2015 [facility]   IR GENERIC HISTORICAL  12/17/2015   IR GUIDED DRAIN W CATHETER PLACEMENT 12/17/2015 [facility]   IR GENERIC HISTORICAL  12/17/2015   IR SINUS/FIST TUBE CHK-NON GI 12/17/2015 [facility]   KNEE SURGERY     LUNG SURGERY     TEE WITHOUT CARDIOVERSION N/A 12/19/2015   Procedure: TRANSESOPHAGEAL ECHOCARDIOGRAM (TEE);  Surgeon: Josue Hector, MD;  Location: [facility] ENDOSCOPY;  Service: Cardiovascular;  Laterality: N/A;   Past Medical History:  Diagnosis Date   Anemia    Cervical neuritis    Chronic kidney disease    stage 3    Dyslipidemia    GERD (gastroesophageal reflux disease)    History of transient ischemic attack (TIA)    HTN (hypertension)    Hyperlipidemia    Migraine    Migraine headache    Occipital neuralgia    Left   Pulmonary embolism ([place]) 2017   Renal insufficiency    Stroke ([place]) 10/2013   BP (!) 150/82   Pulse 97   Ht '5\' 5"'$  (1.651 m)   Wt 180 lb 9.6 oz (81.9 kg)   SpO2 98%   BMI 30.05 kg/m   Opioid Risk Score:   Fall Risk Score:  `1  Depression screen [facility] 2/9     01/28/2022    2:01 PM 01/22/2022    8:25 AM 01/21/2022    3:43 PM 01/09/2022    8:29 AM 01/07/2022    1:31 PM 10/31/2021   12:52 PM 10/15/2021    2:00 PM  Depression screen PHQ 2/9  Decreased Interest 1 0 0 1 1 0 1  Down, Depressed, Hopeless '1 1 1 1 2 1 1  '$ PHQ - 2 Score '2 1 1 2 3 1 2  '$ Altered sleeping  '1 1 2 2  2  '$ Tired, decreased energy  '1 1 2 2  2  '$ Change in appetite  '1 1 2 1  2  '$ Feeling bad or failure about yourself   0 0 1 0  1  Trouble concentrating  0 0 '1 1  2  '$ Moving slowly or fidgety/restless  0 1 1 0  1  Suicidal thoughts  0 0 0 0  0  PHQ-9 Score  '4 5 11 9  12  '$ Difficult doing work/chores       Somewhat difficult     Review of Systems  Constitutional: Negative.   HENT: Negative.    Eyes: Negative.   Respiratory: Negative.    Cardiovascular: Negative.   Gastrointestinal: Negative.   Endocrine: Negative.   Genitourinary: Negative.   Musculoskeletal:  Positive for back pain.  Skin: Negative.   Allergic/Immunologic: Negative.   Neurological: Negative.   Hematological: Negative.   Psychiatric/Behavioral:  Positive for dysphoric mood.   All other systems reviewed and  are negative.      Objective:   Physical Exam Vitals and nursing note reviewed.  Constitutional:      Appearance: She is obese.  HENT:     Head: Normocephalic and atraumatic.  Eyes:     Extraocular Movements: Extraocular movements intact.     Conjunctiva/sclera: Conjunctivae normal.     Pupils: Pupils are equal, round, and  reactive to light.  Skin:    General: Skin is warm and dry.  Neurological:     Mental Status: She is alert and oriented to person, place, and time.  Psychiatric:        Mood and Affect: Mood normal.        Behavior: Behavior normal.    Negative rib cage tenderness. No lower extremity pain with straight leg raising Lumbar flexion is full without pain, extension is limited to 50% but no pain.   Sacral thrust (prone) :neg  FABER's: neg Distraction (supine):neg Thigh thrust test: neg     Assessment & Plan:  #1.  History of right sacroiliac disorder improved after sacroiliac RF.  She still has some nocturnal pain and takes 1 tramadol at night for this. 2.  Chronic intercostal neuralgia continue gabapentin 600 mg twice daily I will see the patient back in 4 months, will reassess sacroiliac pain at that time.

## 2022-01-28 NOTE — Patient Instructions (Signed)
Nice to meet you today.   I will share lung function tests with Dr. Ronnald Ramp.   Have a great holiday!

## 2022-01-28 NOTE — Patient Instructions (Signed)
Please call if you need tramadol refill   Discuss possibly stopping doxepin with Amy Lomax

## 2022-01-28 NOTE — Progress Notes (Signed)
Reviewed: I agree with Dr. Koval's documentation and management. 

## 2022-01-28 NOTE — Assessment & Plan Note (Signed)
Patient has been experiencing dysnea with cough following illness ~ 6 months prior.  Cough has continued with minimal improvement with a wide array of treatments. Spirometry evaluation reveals possible  restrictive lung disease. Post nebulized albuterol tx revealed minimal change.  Significant symptoms with CAT score of 31/40.   -no change to current - albuterol PRN -Reviewed results of pulmonary function tests.  Pt verbalized understanding of results and education.

## 2022-02-01 ENCOUNTER — Other Ambulatory Visit: Payer: Self-pay | Admitting: Family Medicine

## 2022-02-03 ENCOUNTER — Other Ambulatory Visit: Payer: Self-pay | Admitting: Physical Medicine & Rehabilitation

## 2022-02-05 ENCOUNTER — Other Ambulatory Visit: Payer: Medicare Other

## 2022-02-10 ENCOUNTER — Other Ambulatory Visit: Payer: Self-pay | Admitting: Student

## 2022-02-10 DIAGNOSIS — I129 Hypertensive chronic kidney disease with stage 1 through stage 4 chronic kidney disease, or unspecified chronic kidney disease: Secondary | ICD-10-CM | POA: Diagnosis not present

## 2022-02-10 DIAGNOSIS — N1831 Chronic kidney disease, stage 3a: Secondary | ICD-10-CM | POA: Diagnosis not present

## 2022-02-10 DIAGNOSIS — N2581 Secondary hyperparathyroidism of renal origin: Secondary | ICD-10-CM | POA: Diagnosis not present

## 2022-02-10 DIAGNOSIS — D631 Anemia in chronic kidney disease: Secondary | ICD-10-CM | POA: Diagnosis not present

## 2022-02-10 DIAGNOSIS — R3129 Other microscopic hematuria: Secondary | ICD-10-CM | POA: Diagnosis not present

## 2022-02-10 MED ORDER — TRAZODONE HCL 50 MG PO TABS: ORAL_TABLET | ORAL | 0 refills | Status: DC

## 2022-02-11 ENCOUNTER — Other Ambulatory Visit: Payer: Self-pay | Admitting: Student

## 2022-02-11 ENCOUNTER — Telehealth: Payer: Self-pay

## 2022-02-11 MED ORDER — TRAZODONE HCL 50 MG PO TABS: ORAL_TABLET | ORAL | 0 refills | Status: DC

## 2022-02-11 NOTE — Telephone Encounter (Signed)
Patient calls nurse line in regards to Trazodone prescription.   She reports Trazodone was sent to [facility], however she needs this to [facility] to [facility] [address].   Prescription has been cancelled at [name].   [facility] to [facility].

## 2022-02-13 ENCOUNTER — Other Ambulatory Visit (HOSPITAL_COMMUNITY): Payer: Self-pay

## 2022-02-14 ENCOUNTER — Ambulatory Visit (HOSPITAL_COMMUNITY)
Admission: RE | Admit: 2022-02-14 | Discharge: 2022-02-14 | Disposition: A | Discharge status: Home / Self Care | Payer: Medicare Other | Source: Ambulatory Visit | Attending: Cardiovascular Disease | Admitting: Cardiovascular Disease

## 2022-02-14 DIAGNOSIS — I739 Peripheral vascular disease, unspecified: Secondary | ICD-10-CM | POA: Insufficient documentation

## 2022-02-14 DIAGNOSIS — E785 Hyperlipidemia, unspecified: Secondary | ICD-10-CM | POA: Insufficient documentation

## 2022-02-14 DIAGNOSIS — Z8673 Personal history of transient ischemic attack (TIA), and cerebral infarction without residual deficits: Secondary | ICD-10-CM | POA: Insufficient documentation

## 2022-02-14 MED ORDER — INCLISIRAN SODIUM 284 MG/1.5ML ~~LOC~~ SOSY: 284.0000 mg | PREFILLED_SYRINGE | Freq: Once | SUBCUTANEOUS | Status: AC

## 2022-02-14 MED ORDER — INCLISIRAN SODIUM 284 MG/1.5ML ~~LOC~~ SOSY
PREFILLED_SYRINGE | SUBCUTANEOUS | Status: AC
  Administered 2022-02-14: 284 mg via SUBCUTANEOUS
  Filled 2022-02-14: qty 1.5

## 2022-02-20 ENCOUNTER — Ambulatory Visit (INDEPENDENT_AMBULATORY_CARE_PROVIDER_SITE_OTHER): Payer: Medicare Other | Admitting: Family Medicine

## 2022-02-20 VITALS — BP 117/80 | HR 95 | Temp 98.6°F | Wt 181.0 lb

## 2022-02-20 DIAGNOSIS — C44612 Basal cell carcinoma of skin of right upper limb, including shoulder: Secondary | ICD-10-CM | POA: Diagnosis not present

## 2022-02-20 DIAGNOSIS — L989 Disorder of the skin and subcutaneous tissue, unspecified: Secondary | ICD-10-CM | POA: Diagnosis not present

## 2022-02-20 NOTE — Patient Instructions (Signed)
Good to see you today - Thank you for coming in  Things we discussed today:  1) We took a biopsy of your right arm lesion. We will reach out with the results of the study in the next week.

## 2022-02-21 ENCOUNTER — Ambulatory Visit: Payer: Medicare Other | Admitting: Student

## 2022-02-21 NOTE — Progress Notes (Cosign Needed Addendum)
    SUBJECTIVE:   CHIEF COMPLAINT / HPI:   RB is a 71yo F w/ hx of HTN, PVD, GERD, HLD that is here for workup of a skin lesion. She has had a red skin lesion on her R upper arm that has been present for past year and growing in size. Nontender, nonpruritic. Pt is mainly bothered that the lesion bleeds easily and it gets on her clothes. No hx of skin cancer. Is on Plavix  PERTINENT  PMH / PSH: as above  OBJECTIVE:   BP 117/80   Pulse 95   Temp 98.6 F (37 C)   Wt 181 lb (82.1 kg)   SpO2 97%   BMI 30.12 kg/m   Gen: Pleasant, older woman. NAD HEENT: NCAT. MMM Skin: Raised pinkish red colored mass, slightly pedunculated on inferior edge. Rubbery texture, nonfluctuant, nontender.   ASSESSMENT/PLAN:   Skin lesion 1 yr hx of growing R upper arm skin lesion c/f possible pyogenic granuloma. Pt is on plavix. No prior hx of skin cancer.   Consent obtained for punch biopsy. Lesion was cleansed with alcohol wipe, numbed with lidocaine and epi, cleansed again with iodine and alcohol. 75m punch biopsy obtained from edge of lesion containing small amount of normal appearing skin. Bleeding was controled with silver nitrate. Antibacterial ointment applied and bandaid applied.  - Wound care instructions provided - Biopsy sent for path    JArlyce Dice MD [place]

## 2022-02-21 NOTE — Assessment & Plan Note (Addendum)
1 yr hx of growing R upper arm skin lesion c/f possible pyogenic granuloma. Pt is on plavix. No prior hx of skin cancer.   Consent obtained for punch biopsy. Lesion was cleansed with alcohol wipe, numbed with lidocaine and epi, cleansed again with iodine and alcohol. 43m punch biopsy obtained from edge of lesion containing small amount of normal appearing skin. Bleeding was controled with silver nitrate. Antibacterial ointment applied and bandaid applied.  - Wound care instructions provided - Biopsy sent for path

## 2022-03-04 ENCOUNTER — Ambulatory Visit: Payer: Medicare Other | Admitting: Family Medicine

## 2022-03-07 ENCOUNTER — Telehealth: Payer: Self-pay | Admitting: Family Medicine

## 2022-03-07 DIAGNOSIS — C4491 Basal cell carcinoma of skin, unspecified: Secondary | ICD-10-CM

## 2022-03-07 NOTE — Telephone Encounter (Signed)
Spoke with her  Told her that she has a basal cell skin cancer that needs to be removed by a surgical dermatologist who can remove as little as possible  We will put in a referral and she should hear from them with in 2 weeks  If she does not she should call us  She agrees

## 2022-03-11 ENCOUNTER — Telehealth: Payer: Self-pay | Admitting: Family Medicine

## 2022-03-11 ENCOUNTER — Ambulatory Visit: Payer: Medicare Other | Admitting: Family Medicine

## 2022-03-11 NOTE — Telephone Encounter (Signed)
Cancel appt due to having a virus, unable to come to appt. Reschedule appt 09/23/22

## 2022-03-12 DIAGNOSIS — M170 Bilateral primary osteoarthritis of knee: Secondary | ICD-10-CM | POA: Diagnosis not present

## 2022-03-18 ENCOUNTER — Encounter: Payer: Self-pay | Admitting: Family Medicine

## 2022-03-26 ENCOUNTER — Ambulatory Visit (INDEPENDENT_AMBULATORY_CARE_PROVIDER_SITE_OTHER): Payer: Medicare Other | Admitting: Family Medicine

## 2022-03-26 VITALS — BP 132/73 | HR 92 | Temp 99.0°F | Ht 65.0 in | Wt 180.4 lb

## 2022-03-26 DIAGNOSIS — J0110 Acute frontal sinusitis, unspecified: Secondary | ICD-10-CM

## 2022-03-26 MED ORDER — SULFAMETHOXAZOLE-TRIMETHOPRIM 800-160 MG PO TABS: 1.0000 | ORAL_TABLET | Freq: Two times a day (BID) | ORAL | 0 refills | Status: DC

## 2022-03-26 MED ORDER — PREDNISONE 20 MG PO TABS: ORAL_TABLET | ORAL | 0 refills | Status: DC

## 2022-03-26 NOTE — Progress Notes (Signed)
    CHIEF COMPLAINT / HPI:   2 weeks of headache, sinus pressure.  Headache mostly behind eyes and next to sinuses on the face.  Worse with forward pressure.  No fever.  Having lots of drainage and some minimal cough but cough is nonproductive.  No fever.  Has had this before.  PERTINENT  PMH / PSH: I have reviewed the patient's medications, allergies, past medical and surgical history, smoking status and updated in the EMR as appropriate. Smoker History of migraine headache this feels nothing like that.  OBJECTIVE:  BP 132/73   Pulse 92   Temp 99 F (37.2 C)   Ht '5\' 5"'$  (1.651 m)   Wt 180 lb 6.4 oz (81.8 kg)   SpO2 98%   BMI 30.02 kg/m  GENERAL: Well-developed female no acute distress HEENT: Tender to palpation across the maxillary and frontal sinuses.  He pupils are equal round reactive to light.  Conjunctive is nonicteric.  LUNGS: Clear to auscultation.  Lung sounds are somewhat distant.  ASSESSMENT / PLAN: Sinusitis: Bactrim DS twice daily 7 days.  Short course steroids.  Follow-up if not improving.  No problem-specific Assessment & Plan notes found for this encounter.   Dorcas Mcmurray MD

## 2022-03-26 NOTE — Patient Instructions (Signed)
I have sent in a rx for a short dose of steroids and for a full 7 days of antibiotics. If you are not continueing to improve, please call me back.

## 2022-03-30 ENCOUNTER — Other Ambulatory Visit: Payer: Self-pay | Admitting: Student

## 2022-04-07 ENCOUNTER — Telehealth: Payer: Self-pay | Admitting: Student

## 2022-04-07 NOTE — Telephone Encounter (Signed)
Left message for patient to call back and schedule Medicare Annual Wellness Visit (AWV).   Please offer to do virtually or by telephone.  Left office number and my jabber [phone].  Last AWV:09/17/2018   Please schedule at anytime with Nurse Health Advisor.

## 2022-04-13 ENCOUNTER — Other Ambulatory Visit: Payer: Self-pay | Admitting: Physical Medicine & Rehabilitation

## 2022-04-13 ENCOUNTER — Other Ambulatory Visit: Payer: Self-pay | Admitting: Student

## 2022-04-13 DIAGNOSIS — I10 Essential (primary) hypertension: Secondary | ICD-10-CM

## 2022-04-23 DIAGNOSIS — C44612 Basal cell carcinoma of skin of right upper limb, including shoulder: Secondary | ICD-10-CM | POA: Diagnosis not present

## 2022-04-25 ENCOUNTER — Other Ambulatory Visit: Payer: Self-pay | Admitting: Student

## 2022-04-28 DIAGNOSIS — C44612 Basal cell carcinoma of skin of right upper limb, including shoulder: Secondary | ICD-10-CM | POA: Diagnosis not present

## 2022-05-06 ENCOUNTER — Encounter: Payer: Self-pay | Admitting: Family Medicine

## 2022-05-14 ENCOUNTER — Other Ambulatory Visit: Payer: Self-pay

## 2022-05-14 ENCOUNTER — Encounter: Payer: Self-pay | Admitting: Student

## 2022-05-14 ENCOUNTER — Ambulatory Visit (INDEPENDENT_AMBULATORY_CARE_PROVIDER_SITE_OTHER): Payer: Medicare Other | Admitting: Student

## 2022-05-14 VITALS — BP 128/83 | HR 80 | Ht 65.0 in | Wt 179.4 lb

## 2022-05-14 DIAGNOSIS — F419 Anxiety disorder, unspecified: Secondary | ICD-10-CM | POA: Diagnosis not present

## 2022-05-14 MED ORDER — SERTRALINE HCL 100 MG PO TABS: 100.0000 mg | ORAL_TABLET | Freq: Every day | ORAL | 3 refills | Status: DC

## 2022-05-14 NOTE — Progress Notes (Unsigned)
    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 Living alone with visitors coming regularly. She is sleeping a couple hours at time, able to eventually fall back asleep after waking up. Sometimes it will hit her that this has happened and she needs to get some air but doesn't feel SOB or like HR is fast.  Currently taking Zoloft 75 mg daily.   PERTINENT  PMH / PSH: ***  OBJECTIVE:   BP 128/83   Pulse 80   Ht '5\' 5"'$  (1.651 m)   Wt 179 lb 6.4 oz (81.4 kg)   SpO2 100%   BMI 29.85 kg/m  ***  General: NAD, pleasant, able to participate in exam Cardiac: RRR, no murmurs. Respiratory: CTAB, normal effort, No wheezes, rales or rhonchi Abdomen: Bowel sounds present, nontender, nondistended, no hepatosplenomegaly. Extremities: no edema or cyanosis. Skin: warm and dry, no rashes noted Neuro: alert, no obvious focal deficits Psych: Normal affect and mood  ASSESSMENT/PLAN:   No problem-specific Assessment & Plan notes found for this encounter.     Dr. Precious Gilding, [place]    {    This will disappear when note is signed, click to select method of visit    :1}

## 2022-05-14 NOTE — Patient Instructions (Addendum)
It was good to see you today. We increased your zoloft to 100 mg.   For information on therapists, please go to [email or web address]. You can also contact your insurance company to find an in-network therapist.   Return in about 2 weeks to check in and also to go over your medications. Please bring all your medications to this appointment.

## 2022-05-15 NOTE — Assessment & Plan Note (Signed)
Provided therapeutic listening.  Patient is coping remarkably well under the circumstances, sounds like she has a great support network.  Patient is already on trazodone and Zoloft.  Will increase Zoloft from 75 mg to 100 mg daily. Return in 2 weeks to check in and also to go over medications, patient plans to bring all of her medications to this visit.

## 2022-05-30 ENCOUNTER — Encounter: Payer: Self-pay | Admitting: Physical Medicine & Rehabilitation

## 2022-05-30 ENCOUNTER — Encounter: Payer: Medicare Other | Attending: Physical Medicine & Rehabilitation | Admitting: Physical Medicine & Rehabilitation

## 2022-05-30 VITALS — BP 121/77 | HR 95 | Ht 65.0 in | Wt 179.2 lb

## 2022-05-30 DIAGNOSIS — G8929 Other chronic pain: Secondary | ICD-10-CM | POA: Diagnosis not present

## 2022-05-30 DIAGNOSIS — M533 Sacrococcygeal disorders, not elsewhere classified: Secondary | ICD-10-CM | POA: Diagnosis not present

## 2022-05-30 NOTE — Progress Notes (Signed)
Subjective:    Patient ID: [name], female    DOB: 10-24-1950, 72 y.o.   MRN: [id number] 12/24/2021 Right Sacroiliac radio frequency ablation under fluoroscopic guidance This consists of  Right L5 dorsal ramus radio frequency ablation plus RF of  Right  S1-S2 -S3 lateral branches  08/2021 Sacroiliac radio frequency ablation under fluoroscopic guidance This consists of LEFT L5 dorsal ramus radio frequency ablation plus RF of  LEFT S1-S2 -S3 lateral branches HPI 72 year old female with history of sacral fractures following falls in March 2021.  Subsequently developed chronic sacroiliac pain.  She initially required stronger narcotic analgesics after her pelvic fractures but has been weaned down to tramadol 1 tablet nightly.  This has been in combination with sacroiliac RF performed approximately every 6 months on the right and on the left side.  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  No radiating pain down the lower extremities. She has been through extensive physical therapy following her pelvic fractures.  This included both inpatient rehab as well as outpatient. Pain Inventory Average Pain 9 Pain Right Now 10 My pain is constant, sharp, burning, dull, and aching  In the last 24 hours, has pain interfered with the following? General activity 2 Relation with others 2 Enjoyment of life 0 What TIME of day is your pain at its worst? morning , daytime, evening, and night Sleep (in general) Poor  Pain is worse with: walking, bending, and standing Pain improves with: rest and medication Relief from Meds: 5  Family History  Problem Relation Age of Onset   Cancer Mother 28       breast   Alzheimer's disease Mother    Prostate cancer Father    Lung cancer Father    Cancer  Brother    Hyperlipidemia Brother    Sleep apnea Brother    Cancer Brother    Hyperlipidemia Brother    Dementia Brother        frontotemporal dementia   Dementia Maternal Grandmother    Cancer Maternal Grandfather    Social History   Socioeconomic History   Marital status: Married    Spouse name: [name]   Number of children: 1   Years of education: 12+   Highest education level: High school graduate  Occupational History   Occupation: ACTIVITY DIRECTOR    Employer: [employer]   Occupation: CNA  Tobacco Use   Smoking status: Former    Packs/day: 1.50    Years: 42.00    Additional pack years: 0.00    Total pack years: 63.00    Types: Cigarettes    Start date: 03/10/1969    Quit date: 11/01/2011    Years since quitting: 10.5   Smokeless tobacco: Never   Tobacco comments:    Smoked 1.5-2 ppd for adult life (salem menthols)  Vaping Use   Vaping Use: Never used  Substance and Sexual Activity   Alcohol use: No    Alcohol/week: 0.0 standard drinks of alcohol   Drug use: No   Sexual activity: Yes    Partners: Male    Birth control/protection: Surgical    Comment: 1 sexual partner in last 12 months: married  Other Topics Concern   Not on file  Social History Narrative    Patient is married ([name]).  Patient has one child and grandchild. Her sons name is Shanon Brow. Patient sees them often.   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 Patient spends 6-10 hours with her 6 days a week.    Patient has a college education.   Patient is very active in her church.    Hobbies- outside crafts and church choir.   Social Determinants of Health   Financial Resource Strain: Low Risk  (09/17/2018)   Overall Financial Resource Strain (CARDIA)    Difficulty of Paying Living Expenses: Not hard at all  Food Insecurity: No Food Insecurity (09/17/2018)   Hunger Vital Sign    Worried About Running Out of Food in the Last Year: Never true     Ran Out of Food in the Last Year: Never true  Transportation Needs: No Transportation Needs (09/17/2018)   PRAPARE - Hydrologist (Medical): No    Lack of Transportation (Non-Medical): No  Physical Activity: Insufficiently Active (09/17/2018)   Exercise Vital Sign    Days of Exercise per Week: 7 days    Minutes of Exercise per Session: 20 min  Stress: No Stress Concern Present (09/17/2018)   [place]    Feeling of Stress : Only a little  Social Connections: Moderately Integrated (09/17/2018)   Social Connection and Isolation Panel [NHANES]    Frequency of Communication with Friends and Family: Twice a week    Frequency of Social Gatherings with Friends and Family: More than three times a week    Attends Religious Services: 1 to 4 times per year    Active Member of [employer] or Organizations: No    Attends Archivist Meetings: Never    Marital Status: Married   Past Surgical History:  Procedure Laterality Date   ABDOMINAL HYSTERECTOMY     ANTERIOR CERVICAL DECOMP/DISCECTOMY FUSION N/A 05/24/2020   Procedure: ANTERIOR CERVICAL DECOMPRESSION/DISCECTOMY FUSION 1 LEVEL C3-4;  Surgeon: Melina Schools, MD;  Location: [place];  Service: Orthopedics;  Laterality: N/A;  3 hrs   BILIARY STENT PLACEMENT N/A 12/12/2015   Procedure: BILIARY STENT PLACEMENT;  Surgeon: Doran Stabler, MD;  Location: [place];  Service: Endoscopy;  Laterality: N/A;   BLADDER REPAIR     CARPAL TUNNEL RELEASE     CERVICAL FUSION     CERVICAL SPINE SURGERY     CHOLECYSTECTOMY N/A 12/06/2015   Procedure: LAPAROSCOPIC CHOLECYSTECTOMY WITH  INTRAOPERATIVE CHOLANGIOGRAM;  Surgeon: Stark Klein, MD;  Location: [place];  Service: General;  Laterality: N/A;   ELBOW SURGERY     ERCP N/A 12/12/2015   Procedure: ENDOSCOPIC RETROGRADE CHOLANGIOPANCREATOGRAPHY (ERCP);  Surgeon: Doran Stabler, MD;  Location[facility] ENDOSCOPY;   Service: Endoscopy;  Laterality: N/A;   ESOPHAGOGASTRODUODENOSCOPY N/A 01/28/2016   Procedure: ESOPHAGOGASTRODUODENOSCOPY (EGD) Biliary STENT removal;  Surgeon: Doran Stabler, MD;  Location: [facility] ENDOSCOPY;  Service: Gastroenterology;  Laterality: N/A;   IR GENERIC HISTORICAL  12/17/2015   IR US GUIDE BX ASP/DRAIN 12/17/2015 [facility]   IR GENERIC HISTORICAL  12/17/2015   IR GUIDED DRAIN W CATHETER PLACEMENT 12/17/2015 [facility]   IR GENERIC HISTORICAL  12/17/2015   IR SINUS/FIST TUBE CHK-NON GI 12/17/2015 [facility]   KNEE SURGERY     LUNG SURGERY     TEE WITHOUT CARDIOVERSION N/A 12/19/2015   Procedure: TRANSESOPHAGEAL ECHOCARDIOGRAM (TEE);  Surgeon: Josue Hector, MD;  Location: [facility]  ENDOSCOPY;  Service: Cardiovascular;  Laterality: N/A;   Past Surgical History:  Procedure Laterality Date   ABDOMINAL HYSTERECTOMY     ANTERIOR CERVICAL DECOMP/DISCECTOMY FUSION N/A 05/24/2020   Procedure: ANTERIOR CERVICAL DECOMPRESSION/DISCECTOMY FUSION 1 LEVEL C3-4;  Surgeon: Melina Schools, MD;  Location: [place];  Service: Orthopedics;  Laterality: N/A;  3 hrs   BILIARY STENT PLACEMENT N/A 12/12/2015   Procedure: BILIARY STENT PLACEMENT;  Surgeon: Doran Stabler, MD;  Location: [place];  Service: Endoscopy;  Laterality: N/A;   BLADDER REPAIR     CARPAL TUNNEL RELEASE     CERVICAL FUSION     CERVICAL SPINE SURGERY     CHOLECYSTECTOMY N/A 12/06/2015   Procedure: LAPAROSCOPIC CHOLECYSTECTOMY WITH  INTRAOPERATIVE CHOLANGIOGRAM;  Surgeon: Stark Klein, MD;  Location: [place];  Service: General;  Laterality: N/A;   ELBOW SURGERY     ERCP N/A 12/12/2015   Procedure: ENDOSCOPIC RETROGRADE CHOLANGIOPANCREATOGRAPHY (ERCP);  Surgeon: Doran Stabler, MD;  Location: [facility] ENDOSCOPY;  Service: Endoscopy;  Laterality: N/A;   ESOPHAGOGASTRODUODENOSCOPY N/A 01/28/2016   Procedure: ESOPHAGOGASTRODUODENOSCOPY (EGD) Biliary STENT removal;  Surgeon: Doran Stabler, MD;  Location: [facility]OS[facility];  Service:  Gastroenterology;  Laterality: N/A;   IR GENERIC HISTORICAL  12/17/2015   IR US GUIDE BX ASP/DRAIN 12/17/2015 [facility]   IR GENERIC HISTORICAL  12/17/2015   IR GUIDED DRAIN W CATHETER PLACEMENT 12/17/2015 [facility]   IR GENERIC HISTORICAL  12/17/2015   IR SINUS/FIST TUBE CHK-NON GI 12/17/2015 [facility]   KNEE SURGERY     LUNG SURGERY     TEE WITHOUT CARDIOVERSION N/A 12/19/2015   Procedure: TRANSESOPHAGEAL ECHOCARDIOGRAM (TEE);  Surgeon: Josue Hector, MD;  Location: [facility]COPY;  Service: Cardiovascular;  Laterality: N/A;   Past Medical History:  Diagnosis Date   Anemia    Cervical neuritis    Chronic kidney disease    stage 3   Dyslipidemia    GERD (gastroesophageal reflux disease)    History of transient ischemic attack (TIA)    HTN (hypertension)    Hyperlipidemia    Migraine    Migraine headache    Occipital neuralgia    Left   Pulmonary embolism ([place]) 2017   Renal insufficiency    Stroke ([place]) 10/2013   BP 121/77   Pulse 95   Ht 5\' 5"  (1.651 m)   Wt 179 lb 3.2 oz (81.3 kg)   SpO2 93%   BMI 29.82 kg/m   Opioid Risk Score:   Fall Risk Score:  `1  Depression screen [facility] 2/9     05/30/2022    2:16 PM 05/14/2022   11:16 AM 03/26/2022    2:05 PM 01/28/2022    2:01 PM 01/22/2022    8:25 AM 01/21/2022    3:43 PM 01/09/2022    8:29 AM  Depression screen PHQ 2/9  Decreased Interest 1 1 1 1  0 0 1  Down, Depressed, Hopeless 1 1 1 1 1 1 1   PHQ - 2 Score 2 2 2 2 1 1 2   Altered sleeping  1 2  1 1 2   Tired, decreased energy  1 2  1 1 2   Change in appetite  1 2  1 1 2   Feeling bad or failure about yourself   0 0  0 0 1  Trouble concentrating  1 1  0 0 1  Moving slowly or fidgety/restless  0 0  0 1 1  Suicidal thoughts  0 0  0 0 0  PHQ-9 Score  6 9  4 5 11   Difficult doing work/chores  Not difficult at all Not difficult at all        Review of Systems  Constitutional: Negative.   HENT: Negative.    Eyes: Negative.   Respiratory: Negative.     Cardiovascular: Negative.   Gastrointestinal: Negative.   Endocrine: Negative.   Genitourinary: Negative.   Musculoskeletal:  Positive for back pain.  Skin: Negative.   Allergic/Immunologic: Negative.   Neurological: Negative.   Hematological: Negative.   Psychiatric/Behavioral: Negative.    All other systems reviewed and are negative.      Objective:   Physical Exam Vitals and nursing note reviewed.  Constitutional:      Appearance: She is obese.  HENT:     Head: Normocephalic and atraumatic.  Eyes:     Extraocular Movements: Extraocular movements intact.     Conjunctiva/sclera: Conjunctivae normal.     Pupils: Pupils are equal, round, and reactive to light.  Musculoskeletal:     Right lower leg: No edema.     Left lower leg: No edema.     Comments: Limited lumbar range of motion with flexion extension lateral bending and rotation of proximately 25 to 50%  Skin:    General: Skin is warm and dry.  Neurological:     General: No focal deficit present.     Mental Status: She is alert and oriented to person, place, and time.  Psychiatric:        Mood and Affect: Mood normal.        Behavior: Behavior normal.     Sacral thrust (prone) :+ Bilat Lateral compression:neg FABER's: + on Left  Distraction (supine): neg Thigh thrust test: + on Left       Assessment & Plan:   Impression: 1.  Chronic sacroiliac pain following pelvic fractures, she has had good relief with sacroiliac RF, last left-sided procedure was performed in June 2023 last right-sided procedure was performed in October 2023.  As expected the left-sided pain has returned she is now approximately 9 months post procedure. She has failed conservative care and has had reliable relief of greater than 6 months following her sacroiliac RF procedures.  Nevertheless we discussed that her insurance may not authorize this due to change in their policy. Continue tramadol 50 mg nightly We discussed other treatment  options such as sacroiliac intra-articular injection , however do not expect the duration of response compared to radiofrequency procedure.

## 2022-06-02 ENCOUNTER — Ambulatory Visit (INDEPENDENT_AMBULATORY_CARE_PROVIDER_SITE_OTHER): Payer: Medicare Other | Admitting: Student

## 2022-06-02 VITALS — BP 127/80 | HR 99 | Ht 65.0 in | Wt 182.8 lb

## 2022-06-02 DIAGNOSIS — F419 Anxiety disorder, unspecified: Secondary | ICD-10-CM

## 2022-06-02 DIAGNOSIS — Z1231 Encounter for screening mammogram for malignant neoplasm of breast: Secondary | ICD-10-CM

## 2022-06-02 DIAGNOSIS — Z79899 Other long term (current) drug therapy: Secondary | ICD-10-CM | POA: Diagnosis not present

## 2022-06-02 NOTE — Progress Notes (Signed)
    SUBJECTIVE:   CHIEF COMPLAINT / HPI:   Anxiety Patient seen earlier this month after her husband unexpectedly passed away.  Her Zoloft was increased to 100 mg daily which she has been tolerating well.  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  She is still keeping busy and coping well.  Sometimes is not sleeping well but this is not new for her.  PERTINENT  PMH / PSH: Anxiety, polypharmacy  OBJECTIVE:   BP 127/80   Pulse 99   Ht 5\' 5"  (1.651 m)   Wt 182 lb 12.8 oz (82.9 kg)   SpO2 97%   BMI 30.42 kg/m    General: NAD, pleasant, able to participate in exam Cardiac: RRR, no murmurs. Respiratory: CTAB, normal effort, No wheezes, rales or rhonchi Neuro: alert, no obvious focal deficits Psych: Normal affect and mood  ASSESSMENT/PLAN:   Anxiety Patient will continue on Zoloft 100 mg.  We did discuss sleep hygiene and finding a relaxing activity to do before bedtime to help relieve anxiety with hopes she can sleep better.   Polypharmacy Patient brought her medications to this appointment today.  We went through all of them, patient expressed understanding of what each medication was for.  She had her medications organized in bags based on who the prescriber was.  Patient has a good understanding of her medications.  No concerns at this time.   Health maintenance AWV to be scheduled Mammogram ordered Patient declines Shingrix and COVID vaccines  Dr. Precious Gilding, [place]

## 2022-06-02 NOTE — Patient Instructions (Addendum)
It was great to see you! Thank you for allowing me to participate in your care!  I recommend that you always bring your medications to each appointment as this makes it easy to ensure you are on the correct medications and helps [place] not miss when refills are needed.  Our plans for today:  -Will be contacted to schedule your annual Medicare wellness visit -Please call the breast center to schedule your mammogram at [phone] -Follow-up with me in 4 to 6 months or sooner if needed    Take care and seek immediate care sooner if you develop any concerns.   Dr. Precious Gilding, DO [facility]

## 2022-06-02 NOTE — Assessment & Plan Note (Signed)
Patient will continue on Zoloft 100 mg.  We did discuss sleep hygiene and finding a relaxing activity to do before bedtime to help relieve anxiety with hopes she can sleep better.

## 2022-06-02 NOTE — Assessment & Plan Note (Signed)
Patient brought her medications to this appointment today.  We went through all of them, patient expressed understanding of what each medication was for.  She had her medications organized in bags based on who the prescriber was.  Patient has a good understanding of her medications.  No concerns at this time.

## 2022-06-09 ENCOUNTER — Encounter: Payer: Self-pay | Admitting: Family Medicine

## 2022-06-11 ENCOUNTER — Other Ambulatory Visit (HOSPITAL_BASED_OUTPATIENT_CLINIC_OR_DEPARTMENT_OTHER): Payer: Self-pay | Admitting: Family

## 2022-06-11 DIAGNOSIS — Z79899 Other long term (current) drug therapy: Secondary | ICD-10-CM

## 2022-06-11 DIAGNOSIS — I25118 Atherosclerotic heart disease of native coronary artery with other forms of angina pectoris: Secondary | ICD-10-CM

## 2022-06-11 DIAGNOSIS — E785 Hyperlipidemia, unspecified: Secondary | ICD-10-CM

## 2022-06-11 NOTE — Telephone Encounter (Signed)
Rx request sent to pharmacy.  

## 2022-06-13 ENCOUNTER — Telehealth: Payer: Self-pay | Admitting: Student

## 2022-06-13 NOTE — Telephone Encounter (Signed)
Contacted [name] to schedule their annual wellness visit. Appointment made for 06/19/2022 at 8"30AM.  [name]

## 2022-06-13 NOTE — Telephone Encounter (Signed)
Called patient to schedule Medicare Annual Wellness Visit (AWV). Left message for patient to call back and schedule Medicare Annual Wellness Visit (AWV).  Last date of AWV: 09/17/2018  If patient calls back please forward message to me or let me know and I can take the call.   If any questions, please contact me at [phone].  Thank you ,  [place]

## 2022-06-16 ENCOUNTER — Other Ambulatory Visit: Payer: Self-pay | Admitting: *Deleted

## 2022-06-16 ENCOUNTER — Encounter: Payer: Self-pay | Admitting: Family Medicine

## 2022-06-16 MED ORDER — TIZANIDINE HCL 4 MG PO TABS: 4.0000 mg | ORAL_TABLET | Freq: Four times a day (QID) | ORAL | 1 refills | Status: DC | PRN

## 2022-06-16 NOTE — Telephone Encounter (Signed)
Last seen on 10/14/21 Follow up scheduled on 10/14/21 Last filled on #360 tablet ( 90 day supply)

## 2022-06-18 ENCOUNTER — Other Ambulatory Visit: Payer: Self-pay

## 2022-06-18 ENCOUNTER — Encounter: Payer: Self-pay | Admitting: Family Medicine

## 2022-06-18 ENCOUNTER — Ambulatory Visit (INDEPENDENT_AMBULATORY_CARE_PROVIDER_SITE_OTHER): Payer: Medicare Other | Admitting: Family Medicine

## 2022-06-18 VITALS — BP 156/90 | HR 92 | Ht 65.0 in | Wt 181.4 lb

## 2022-06-18 DIAGNOSIS — J0110 Acute frontal sinusitis, unspecified: Secondary | ICD-10-CM | POA: Diagnosis not present

## 2022-06-18 MED ORDER — AMOXICILLIN 500 MG PO CAPS: 500.0000 mg | ORAL_CAPSULE | Freq: Three times a day (TID) | ORAL | 0 refills | Status: AC

## 2022-06-18 MED ORDER — FLUTICASONE PROPIONATE 50 MCG/ACT NA SUSP: 1.0000 | Freq: Every day | NASAL | 2 refills | Status: DC

## 2022-06-18 NOTE — Patient Instructions (Signed)
Good to see you today - Thank you for coming in  Things we discussed today:  Sinusitis Should be better by the time you finish the antibiotics If high fever or shortness of breath or no better in one week let us know Nasal saline several times a day Stop affrin after one day Flonase daily

## 2022-06-18 NOTE — Assessment & Plan Note (Signed)
History and exam consistent with R maxillary sinusitis.  Treat with amox 500 three times a day and continue antihistamines and flonase

## 2022-06-18 NOTE — Progress Notes (Signed)
    SUBJECTIVE:   CHIEF COMPLAINT / HPI:   URI  Major symptoms: congestion and discomfort on R ear and face Progression: started approx 7 days ago not improving Medications tried: afrin otc nasal meds Sick contacts: no Patient believes may be caused by sinuses  Symptoms Fever: no Headache or face pain: on R side mild Tooth pain: mild on R side Sneezing: yes allergies Scratchy throat: no Allergies: yearly taking zyrtec ran out of flonase Muscle aches: no Severe fatigue: no Stiff neck: no Shortness of breath: no Rash: no Sore throat or swollen glands: no   PERTINENT  PMH / PSH: migraine No immunocompromising conditions History of PE      OBJECTIVE:   BP (!) 156/90   Pulse 92   Ht 5\' 5"  (1.651 m)   Wt 181 lb 6.4 oz (82.3 kg)   SpO2 97%   BMI 30.19 kg/m   Alert no distress Heart - Regular rate and rhythm.  No murmurs, gallops or rubs.    Lungs:  Normal respiratory effort, chest expands symmetrically. Lungs are clear to auscultation, no crackles or wheezes. Neck:  No deformities, thyromegaly, masses, or tenderness noted.   Supple with full range of motion without pain. Throat: normal mucosa, no exudate, uvula midline, no redness Face - mild tenderness to percussion over R frontal and maxillary sinuses   ASSESSMENT/PLAN:   Acute non-recurrent frontal sinusitis Assessment & Plan: History and exam consistent with R maxillary sinusitis.  Treat with amox 500 three times a day and continue antihistamines and flonase    Other orders -     Amoxicillin; Take 1 capsule (500 mg total) by mouth 3 (three) times daily for 10 days.  Dispense: 30 capsule; Refill: 0 -     Fluticasone Propionate; Place 1 spray into both nostrils daily.  Dispense: 15.8 mL; Refill: 2     Patient Instructions  Good to see you today - Thank you for coming in  Things we discussed today:  Sinusitis Should be better by the time you finish the antibiotics If high fever or shortness of breath  or no better in one week let us know Nasal saline several times a day Stop affrin after one day Flonase daily    Carney Living, MD [facility]

## 2022-06-18 NOTE — Patient Instructions (Signed)

## 2022-06-18 NOTE — Progress Notes (Unsigned)
I connected with  [name] on 06/19/2022 by a audio enabled telemedicine application and verified that I am speaking with the correct person using two identifiers.  Patient Location: Home  Provider Location: Home Office  I discussed the limitations of evaluation and management by telemedicine. The patient expressed understanding and agreed to proceed.  Subjective:   [name] is a 72 y.o. female who presents for Medicare Annual (Subsequent) preventive examination.  Review of Systems    Per HPI unless specifically indicated below.  Cardiac Risk Factors include: advanced age (>30men, >27 women);female gender, Essential Hypertension, and Hyperlipidemia.           Objective:    Today's Vitals   06/19/22 0833 06/19/22 0839  BP: 120/78   PainSc:  7    There is no height or weight on file to calculate BMI.     06/18/2022    8:41 AM 05/14/2022   11:16 AM 02/20/2022    2:24 PM 01/22/2022    8:26 AM 01/21/2022    3:44 PM 01/09/2022    8:29 AM 10/15/2021    2:00 PM  Advanced Directives  Does Patient Have a Medical Advance Directive? No No No No No No No  Would patient like information on creating a medical advance directive? No - Patient declined No - Patient declined  No - Patient declined No - Patient declined No - Patient declined No - Patient declined    Current Medications (verified) Outpatient Encounter Medications as of 06/19/2022  Medication Sig   albuterol (VENTOLIN HFA) 108 (90 Base) MCG/ACT inhaler Inhale 2 puffs into the lungs every 6 (six) hours as needed for wheezing or shortness of breath.   amLODipine (NORVASC) 5 MG tablet TAKE 1 TABLET (5 MG TOTAL) BY MOUTH DAILY.   amoxicillin (AMOXIL) 500 MG capsule Take 1 capsule (500 mg total) by mouth 3 (three) times daily for 10 days.   clopidogrel (PLAVIX) 75 MG tablet TAKE 1 TABLET BY MOUTH EVERY DAY   doxepin (SINEQUAN) 100 MG capsule Take 2 capsules (200 mg total) by mouth at bedtime.   ezetimibe (ZETIA)  10 MG tablet TAKE 1 TABLET BY MOUTH EVERY DAY   fluticasone (FLONASE) 50 MCG/ACT nasal spray Place 1 spray into both nostrils daily.   furosemide (LASIX) 40 MG tablet Take 1 tablet (40 mg total) by mouth daily.   gabapentin (NEURONTIN) 600 MG tablet TAKE 1 TABLET BY MOUTH TWICE A DAY   inclisiran (LEQVIO) 284 MG/1.5ML SOSY injection Inject as directed   losartan (COZAAR) 25 MG tablet TAKE 1 TABLET BY MOUTH EVERYDAY AT BEDTIME   ondansetron (ZOFRAN) 4 MG tablet Take 1 tablet (4 mg total) by mouth every 8 (eight) hours as needed for nausea or vomiting.   pantoprazole (PROTONIX) 40 MG tablet TAKE 1 TABLET(40 MG) BY MOUTH TWICE DAILY   Rimegepant Sulfate (NURTEC) 75 MG TBDP Take 75 mg by mouth daily as needed (take for abortive therapy of migraine, no more than 1 tablet in 24 hours or 10 per month).   rosuvastatin (CRESTOR) 5 MG tablet TAKE 1 TABLET (5 MG TOTAL) BY MOUTH DAILY.   sertraline (ZOLOFT) 100 MG tablet Take 1 tablet (100 mg total) by mouth daily.   tiZANidine (ZANAFLEX) 4 MG tablet Take 1 tablet (4 mg total) by mouth every 6 (six) hours as needed for muscle spasms.   traMADol (ULTRAM) 50 MG tablet TAKE 1 TABLET EVERY 8 HOURS AS NEEDED FOR MODERATE PAIN   traZODone (DESYREL)  50 MG tablet TAKE 1/2 TO 1 TABLET BY MOUTH AT BEDTIME AS NEEDED FOR SLEEP   zonisamide (ZONEGRAN) 100 MG capsule Take 1 capsule (100 mg total) by mouth 2 (two) times daily.   zonisamide (ZONEGRAN) 25 MG capsule take 25 mg twice daily prn in addition to 100 mg twice daily.   cetirizine (ZYRTEC) 10 MG tablet Take 1 tablet (10 mg total) by mouth daily with breakfast.   No facility-administered encounter medications on file as of 06/19/2022.    Allergies (verified) Iodinated contrast media, Shellfish allergy, Crestor [rosuvastatin], Lipitor [atorvastatin], Statins, and Iodine   History: Past Medical History:  Diagnosis Date   Anemia    Cervical neuritis    Chronic kidney disease    stage 3   Dyslipidemia    GERD  (gastroesophageal reflux disease)    History of transient ischemic attack (TIA)    HTN (hypertension)    Hyperlipidemia    Migraine    Migraine headache    Occipital neuralgia    Left   Pulmonary embolism 2017   Renal insufficiency    Stroke 10/2013   Past Surgical History:  Procedure Laterality Date   ABDOMINAL HYSTERECTOMY     ANTERIOR CERVICAL DECOMP/DISCECTOMY FUSION N/A 05/24/2020   Procedure: ANTERIOR CERVICAL DECOMPRESSION/DISCECTOMY FUSION 1 LEVEL C3-4;  Surgeon: Venita LickBrooks, Dahari, MD;  Location: [facility] OR;  Service: Orthopedics;  Laterality: N/A;  3 hrs   BILIARY STENT PLACEMENT N/A 12/12/2015   Procedure: BILIARY STENT PLACEMENT;  Surgeon: Sherrilyn RistHenry L Danis III, MD;  Location: [facility] END[facility]COPY;  Service: Endoscopy;  Laterality: N/A;   BLADDER REPAIR     CARPAL TUNNEL RELEASE     CERVICAL FUSION     CERVICAL SPINE SURGERY     CHOLECYSTECTOMY N/A 12/06/2015   Procedure: LAPAROSCOPIC CHOLECYSTECTOMY WITH  INTRAOPERATIVE CHOLANGIOGRAM;  Surgeon: Almond LintFaera Byerly, MD;  Location: [facility] OR;  Service: General;  Laterality: N/A;   ELBOW SURGERY     ERCP N/A 12/12/2015   Procedure: ENDOSCOPIC RETROGRADE CHOLANGIOPANCREATOGRAPHY (ERCP);  Surgeon: Sherrilyn RistHenry L Danis III, MD;  [name];  Service: Endoscopy;  Laterality: N/A;   ESOPHAGOGASTRODUODENOSCOPY N/A 01/28/2016   Procedure: ESOPHAGOGASTRODUODENOSCOPY (EGD) Biliary STENT removal;  Surgeon: Sherrilyn RistHenry L Danis III, MD;  Location: [facility] ENDOSCOPY;  Service: Gastroenterology;  Laterality: N/A;   IR GENERIC HISTORICAL  12/17/2015   IR US GUIDE BX ASP/DRAIN 12/17/2015 [facility]   IR GENERIC HISTORICAL  12/17/2015   IR GUIDED DRAIN W CATHETER PLACEMENT 12/17/2015 [facility]   IR GENERIC HISTORICAL  12/17/2015   IR SINUS/FIST TUBE CHK-NON GI 12/17/2015 [facility]   KNEE SURGERY     LUNG SURGERY     TEE WITHOUT CARDIOVERSION N/A 12/19/2015   Procedure: TRANSESOPHAGEAL ECHOCARDIOGRAM (TEE);  Surgeon: Wendall StadePeter C Nishan, MD;  [facility];  Service:  Cardiovascular;  Laterality: N/A;   Family History  Problem Relation Age of Onset   Cancer Mother [id number]       breast   Alzheimer's disease Mother    Prostate cancer Father    Lung cancer Father    Cancer Brother    Hyperlipidemia Brother    Sleep apnea Brother    Cancer Brother    Hyperlipidemia Brother    Dementia Brother        frontotemporal dementia   Dementia Maternal Grandmother    Cancer Maternal Grandfather    Social History   Socioeconomic History   Marital status: Married    Spouse name: [name]   Number of children:  1   Years of education: 12+   Highest education level: High school graduate  Occupational History   Occupation: ACTIVITY DIRECTOR    Employer: [employer]   Occupation: CNA   Occupation: Retired  Tobacco Use   Smoking status: Former    Packs/day: 1.50    Years: 42.00    Additional pack years: 0.00    Total pack years: 63.00    Types: Cigarettes    Start date: 03/10/1969    Quit date: 11/01/2011    Years since quitting: 10.6   Smokeless tobacco: Never   Tobacco comments:    Smoked 1.5-2 ppd for adult life (salem menthols)  Vaping Use   Vaping Use: Never used  Substance and Sexual Activity   Alcohol use: No    Alcohol/week: 0.0 standard drinks of alcohol   Drug use: No   Sexual activity: Yes    Partners: Male    Birth control/protection: Surgical    Comment: 1 sexual partner in last 12 months: married  Other Topics Concern   Not on file  Social History Narrative    Patient is married ([name]).   Patient has one child and grandchild. Her sons name is [name]. Patient sees them often.   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 Patient spends 6-10 hours with her 6 days a week.    Patient has a college education.   Patient is very active in her church.    Hobbies- outside crafts and church choir.   Social Determinants of Health   Financial Resource Strain: Low Risk  (06/19/2022)   Overall  Financial Resource Strain (CARDIA)    Difficulty of Paying Living Expenses: Not hard at all  Food Insecurity: No Food Insecurity (06/19/2022)   Hunger Vital Sign    Worried About Running Out of Food in the Last Year: Never true    Ran Out of Food in the Last Year: Never true  Transportation Needs: No Transportation Needs (06/19/2022)   PRAPARE - Administrator, Civil Service (Medical): No    Lack of Transportation (Non-Medical): No  Physical Activity: Sufficiently Active (06/19/2022)   Exercise Vital Sign    Days of Exercise per Week: 5 days    Minutes of Exercise per Session: 30 min  Stress: Stress Concern Present (06/19/2022)   [employer] of Occupational Health - Occupational Stress Questionnaire    Feeling of Stress : To some extent  Social Connections: Moderately Integrated (06/19/2022)   Social Connection and Isolation Panel [NHANES]    Frequency of Communication with Friends and Family: More than three times a week    Frequency of Social Gatherings with Friends and Family: Twice a week    Attends Religious Services: More than 4 times per year    Active Member of [employer] or Organizations: Yes    Attends Banker Meetings: Never    Marital Status: Widowed    Tobacco Counseling Counseling given: Not Answered Tobacco comments: Smoked 1.5-2 ppd for adult life (salem menthols)   Clinical Intake:  Pre-visit preparation completed: No  Pain : 0-10 Pain Score: 7  Pain Type: Acute pain Pain Location: Head Pain Descriptors / Indicators: Aching     Nutritional Status: BMI > 30  Obese Nutritional Risks: None Diabetes: No  How often do you need to have someone help you when you read instructions, pamphlets, or other written materials from your doctor or pharmacy?: 1 - Never  Diabetic?No   Interpreter Needed?: No  Information entered by :: Laurel Dimmer, CMA   Activities of Daily Living    06/19/2022    8:39 AM  In your present state of  health, do you have any difficulty performing the following activities:  Hearing? 0  Vision? 0  Difficulty concentrating or making decisions? 1  Walking or climbing stairs? 0  Dressing or bathing? 0  Doing errands, shopping? 0    Patient Care Team: Erick Alley, DO as PCP - General (Family Medicine) Chilton Si, MD as PCP - Cardiology (Cardiology) Key, Verita Schneiders, NP as Nurse Practitioner (Gynecology) Terrial Rhodes, MD as Consulting Physician (Nephrology) York Spaniel, MD (Inactive) as Consulting Physician (Neurology) Wynn Banker Victorino Sparrow, MD as Consulting Physician (Physical Medicine and Rehabilitation) Leeroy Bock, MD (Family Medicine)  Indicate any recent Medical Services you may have received from other than [facility] providers in the past year (date may be approximate).     Assessment:   This is a routine wellness examination for Alena.   Hearing/Vision screen Denies any hearing issues. Denies any change to her vision. Overdue for an Annual Eye Exam.  Dietary issues and exercise activities discussed: Current Exercise Habits: Structured exercise class, Type of exercise: walking, Time (Minutes): 30, Frequency (Times/Week): 5, Weekly Exercise (Minutes/Week): 150, Intensity: Moderate, Exercise limited by: orthopedic condition(s)   Goals Addressed   None    Depression Screen    06/19/2022    8:37 AM 06/18/2022    8:40 AM 06/02/2022   12:15 PM 05/30/2022    2:16 PM 05/14/2022   11:16 AM 03/26/2022    2:05 PM 01/28/2022    2:01 PM  PHQ 2/9 Scores  PHQ - 2 Score 2 2 4 2 2 2 2   PHQ- 9 Score 8 8 9  6 9      Fall Risk    06/19/2022    8:38 AM 06/18/2022    8:40 AM 05/30/2022    2:16 PM 05/14/2022   11:16 AM 03/26/2022    2:07 PM  Fall Risk   Falls in the past year? 0 0 0 0 0  Number falls in past yr: 0 0  0 0  Injury with Fall? 0 0  0 0  Risk for fall due to : No Fall Risks      Follow up Falls evaluation completed        FALL RISK PREVENTION PERTAINING  TO THE HOME:  Any stairs in or around the home? No  If so, are there any without handrails? No  Home free of loose throw rugs in walkways, pet beds, electrical cords, etc? Yes  Adequate lighting in your home to reduce risk of falls? Yes   ASSISTIVE DEVICES UTILIZED TO PREVENT FALLS:  Life alert? No  Use of a cane, walker or w/c? Yes  Grab bars in the bathroom? Yes  Shower chair or bench in shower? Yes  Elevated toilet seat or a handicapped toilet? Yes   TIMED UP AND GO:  Was the test performed? Unable to perform, virtual appointment    Cognitive Function:        06/19/2022    8:43 AM 09/17/2018    8:56 AM  6CIT Screen  What Year? 0 points 0 points  What month? 0 points 0 points  What time? 0 points 0 points  Count back from 20 0 points 0 points  Months in reverse 0 points 0 points  Repeat phrase 0 points 0 points  Total  Score 0 points 0 points    Immunizations Immunization History  Administered Date(s) Administered   COVID-19, mRNA, vaccine(Comirnaty)12 years and older 01/21/2022   Influenza,inj,Quad PF,6+ Mos 01/25/2016, 01/02/2017, 01/21/2022   Influenza-Unspecified 03/25/2016   Moderna Sars-Covid-2 Vaccination 08/19/2019, 09/16/2019   PNEUMOCOCCAL CONJUGATE-20 05/10/2021   Pneumococcal Conjugate-13 07/30/2017   Tdap 01/25/2016    TDAP status: Up to date  Flu Vaccine status: Up to date  Pneumococcal vaccine status: Up to date  Covid-19 vaccine status: Information provided on how to obtain vaccines.   Qualifies for Shingles Vaccine? Yes   Zostavax completed No   Shingrix Completed?: No.    Education has been provided regarding the importance of this vaccine. Patient has been advised to call insurance company to determine out of pocket expense if they have not yet received this vaccine. Advised may also receive vaccine at local pharmacy or Health Dept. Verbalized acceptance and understanding.  Screening Tests Health Maintenance  Topic Date Due   COVID-19  Vaccine (4 - 2023-24 season) 03/18/2022   MAMMOGRAM  04/02/2022   Zoster Vaccines- Shingrix (1 of 2) 09/02/2022 (Originally 11/01/1969)   INFLUENZA VACCINE  10/09/2022   Lung Cancer Screening  01/18/2023   Medicare Annual Wellness (AWV)  06/19/2023   Fecal DNA (Cologuard)  06/03/2024   DTaP/Tdap/Td (2 - Td or Tdap) 01/24/2026   Pneumonia Vaccine 57+ Years old  Completed   DEXA SCAN  Completed   Hepatitis C Screening  Completed   HPV VACCINES  Aged Out    Health Maintenance  Health Maintenance Due  Topic Date Due   COVID-19 Vaccine (4 - 2023-24 season) 03/18/2022   MAMMOGRAM  04/02/2022    Colorectal cancer screening: Type of screening: Cologuard. Completed 06/03/2021. Repeat every 3 years  Mammogram status: Ordered ans scheduled for 07/11/22. Pt provided with contact info and advised to call to schedule appt.   DEXA Scan: 11/01/2020  Lung Cancer Screening: (Low Dose CT Chest recommended if Age 104-80 years, 30 pack-year currently smoking OR have quit w/in 15years.) does not qualify.   Lung Cancer Screening Referral: not applicable   Additional Screening:  Hepatitis C Screening: does qualify; Completed 04/02/2015  Vision Screening: Recommended annual ophthalmology exams for early detection of glaucoma and other disorders of the eye. Is the patient up to date with their annual eye exam?  No Who is the provider or what is the name of the office in which the patient attends annual eye exams?  If pt is not established with a provider, would they like to be referred to a provider to establish care? No .   Dental Screening: Recommended annual dental exams for proper oral hygiene  Community Resource Referral / Chronic Care Management: CRR required this visit?  No   CCM required this visit?  No      Plan:     I have personally reviewed and noted the following in the patient's chart:   Medical and social history Use of alcohol, tobacco or illicit drugs  Current  medications and supplements including opioid prescriptions. Patient is not currently taking opioid prescriptions. Functional ability and status Nutritional status Physical activity Advanced directives List of other physicians Hospitalizations, surgeries, and ER visits in previous 12 months Vitals Screenings to include cognitive, depression, and falls Referrals and appointments  In addition, I have reviewed and discussed with patient certain preventive protocols, quality metrics, and best practice recommendations. A written personalized care plan for preventive services as well as general preventive health recommendations were provided  to patient.     Ms. Lech , Thank you for taking time to come for your Medicare Wellness Visit. I appreciate your ongoing commitment to your health goals. Please review the following plan we discussed and let me know if I can assist you in the future.   These are the goals we discussed:  Goals      Weight (lb) < 160 lb (72.6 kg)        This is a list of the screening recommended for you and due dates:  Health Maintenance  Topic Date Due   COVID-19 Vaccine (4 - 2023-24 season) 03/18/2022   Mammogram  04/02/2022   Zoster (Shingles) Vaccine (1 of 2) 09/02/2022*   Flu Shot  10/09/2022   Screening for Lung Cancer  01/18/2023   Medicare Annual Wellness Visit  06/19/2023   Cologuard (Stool DNA test)  06/03/2024   DTaP/Tdap/Td vaccine (2 - Td or Tdap) 01/24/2026   Pneumonia Vaccine  Completed   DEXA scan (bone density measurement)  Completed   Hepatitis C Screening: USPSTF Recommendation to screen - Ages 67-79 yo.  Completed   HPV Vaccine  Aged Out  *Topic was postponed. The date shown is not the original due date.    Lonna Cobb, CMA   06/19/2022  Nurse Notes: Approximately 30 minute Non-Face -To-Face Medicare Wellness Visit

## 2022-06-19 ENCOUNTER — Ambulatory Visit (INDEPENDENT_AMBULATORY_CARE_PROVIDER_SITE_OTHER): Payer: Medicare Other

## 2022-06-19 VITALS — BP 120/78

## 2022-06-19 DIAGNOSIS — Z0000 Encounter for general adult medical examination without abnormal findings: Secondary | ICD-10-CM

## 2022-06-24 ENCOUNTER — Encounter: Payer: Medicare Other | Admitting: Physical Medicine & Rehabilitation

## 2022-06-24 VITALS — Ht 65.0 in

## 2022-06-24 DIAGNOSIS — G8929 Other chronic pain: Secondary | ICD-10-CM

## 2022-06-24 MED ORDER — LIDOCAINE HCL 1 % IJ SOLN
10.0000 mL | Freq: Once | INTRAMUSCULAR | Status: AC
  Administered 2022-07-18: 10 mL

## 2022-06-24 MED ORDER — LIDOCAINE HCL (PF) 2 % IJ SOLN
2.0000 mL | Freq: Once | INTRAMUSCULAR | Status: AC
  Administered 2022-07-18: 2 mL

## 2022-06-30 DIAGNOSIS — M170 Bilateral primary osteoarthritis of knee: Secondary | ICD-10-CM | POA: Diagnosis not present

## 2022-07-06 ENCOUNTER — Other Ambulatory Visit: Payer: Self-pay | Admitting: Student

## 2022-07-06 DIAGNOSIS — I10 Essential (primary) hypertension: Secondary | ICD-10-CM

## 2022-07-10 ENCOUNTER — Other Ambulatory Visit: Payer: Self-pay | Admitting: Student

## 2022-07-10 ENCOUNTER — Other Ambulatory Visit: Payer: Self-pay | Admitting: Family Medicine

## 2022-07-11 ENCOUNTER — Ambulatory Visit
Admission: RE | Admit: 2022-07-11 | Discharge: 2022-07-11 | Disposition: A | Discharge status: Home / Self Care | Payer: Medicare Other | Source: Ambulatory Visit | Attending: Family Medicine | Admitting: Family Medicine

## 2022-07-11 DIAGNOSIS — Z1231 Encounter for screening mammogram for malignant neoplasm of breast: Secondary | ICD-10-CM

## 2022-07-17 ENCOUNTER — Telehealth: Payer: Self-pay | Admitting: Family Medicine

## 2022-07-17 MED ORDER — ZONISAMIDE 25 MG PO CAPS: ORAL_CAPSULE | ORAL | 0 refills | Status: DC

## 2022-07-17 MED ORDER — ZONISAMIDE 100 MG PO CAPS: 100.0000 mg | ORAL_CAPSULE | Freq: Two times a day (BID) | ORAL | 0 refills | Status: DC

## 2022-07-17 MED ORDER — DOXEPIN HCL 100 MG PO CAPS: 200.0000 mg | ORAL_CAPSULE | Freq: Every day | ORAL | 0 refills | Status: DC

## 2022-07-17 NOTE — Telephone Encounter (Signed)
Pt last seen on 10/14/21 per note ". We will continue zonisamide 100mg  twice daily with extra 25mg  dose twice daily as needed.    Insomnia is better on doxepin 200mg  at bedtime. We have discussed need to wean dosing. She has taken 200mg  daily for many years. Once migraines are improved, we will start weaning dose   Follow up scheduled on 09/23/22  I called patient to discuss migraines, per note Amy wanted patient to wean off doxepin 200 mg tablets once migraines improved. Pt said due to weather her migraines has not improved, she reports her husband also unexpectedly passed way in Feb, so migraines have not improved. Pt can follow up with discussing weaning in July with Amy at office visit.    Hey Amy this is the patient we discuss today about the doxepin 200 mg dose.

## 2022-07-17 NOTE — Telephone Encounter (Signed)
I have advised patient that we will need to wean dosage of doxepin. Also on trazodone per PCP. Will approve refill until she has follow up with me in July but then will need to wean dose. May consider referral to psychiatry if needed.

## 2022-07-17 NOTE — Progress Notes (Signed)
  PROCEDURE RECORD [facility]   Name: [name] DOB:1950-04-30 MRN: [id number]  Date:07/17/2022  Physician: Claudette Laws, MD    Nurse/CMA: Charise Carwin MA  Allergies:  Allergies  Allergen Reactions   Iodinated Contrast Media Shortness Of Breath and Itching    States she had this reaction connected with a MRI of the cervical spine.   Shellfish Allergy Anaphylaxis    All seafood   Crestor [Rosuvastatin] Other (See Comments)    Anxiety, itching, headaches, myalgias Able to tolerate 5mg  daily   Lipitor [Atorvastatin] Other (See Comments)    Anxiety, itching, headaches, myalgias   Statins Other (See Comments)    Arthralgias (severe) with atorvastatin, mild arthralgias with rosuvastatin but willing to continue taking it   Iodine     Consent Signed: Yes.    Is patient diabetic? No.  CBG today? N/a  Pregnant: No. LMP: No LMP recorded. Patient has had a hysterectomy. (age 45-55)  Anticoagulants: yes (Plavix on hold  for 3 days ) Anti-inflammatory: no Antibiotics: no  Procedure: Left Sacroiliac Radiofrequecy  Position: Prone Start Time: 3:51 pm  End Time: 4:10 pm  Fluoro Time: 57  RN/CMA Maral Lampe MA Pammy Vesey MA Lanika Colgate MA   Time 3:37 pm 4:15 pm 4;22 PM   BP 147/86 161/80 155/86   Pulse 91 90    Respirations 16 16    O2 Sat 95 97    S/S 6 6    Pain Level 9/10 0/10     D/C home with Self, [name] & O X 3, D/C instructions reviewed, and sits independently.

## 2022-07-17 NOTE — Telephone Encounter (Signed)
Pt is needing her: doxepin (SINEQUAN) 100 MG capsule zonisamide (ZONEGRAN) 100 MG capsule zonisamide (ZONEGRAN) 25 MG capsule Requested at the [facility] on [address]

## 2022-07-18 ENCOUNTER — Encounter: Payer: Medicare Other | Attending: Physical Medicine & Rehabilitation | Admitting: Physical Medicine & Rehabilitation

## 2022-07-18 ENCOUNTER — Encounter: Payer: Self-pay | Admitting: Physical Medicine & Rehabilitation

## 2022-07-18 VITALS — BP 147/86 | HR 91 | Temp 98.3°F | Ht 65.0 in | Wt 179.0 lb

## 2022-07-18 DIAGNOSIS — M533 Sacrococcygeal disorders, not elsewhere classified: Secondary | ICD-10-CM | POA: Diagnosis not present

## 2022-07-18 DIAGNOSIS — G8929 Other chronic pain: Secondary | ICD-10-CM | POA: Diagnosis not present

## 2022-07-18 MED ORDER — GABAPENTIN 600 MG PO TABS: 600.0000 mg | ORAL_TABLET | Freq: Two times a day (BID) | ORAL | 6 refills | Status: DC

## 2022-07-18 NOTE — Progress Notes (Signed)
Sacroiliac radio frequency ablation under fluoroscopic guidance This consists of LEFT L5 dorsal ramus radio frequency ablation plus RF of  LEFT S1-S2 -S3 lateral branches  Indication is sacroiliac pain which has improved temporarily onby at least 50% following sacroiliac intra-articular injection and L5 , S1,2,3 Lateral branch blocks under fluoroscopic guidance. Pain interferes with self-care and mobility and has failed to respond to conservative measures.  Informed consent was obtained after discussing risks and benefits of the procedure with the patient these include bleeding bruising and infection temporary or permanent paralysis. The patient elects to proceed and has given written consent.  Patient placed prone on fluoroscopy table. Area marked and prepped with Betadine. Fluoroscopic images utilized to guide needle. 25-gauge 1.5 inch needle was used to anesthetize 4 injection points with 2 cc of 1% lidocaine each. Then a 18-gauge 10 cm RF needle with a 10 mm curved active tip was inserted under fluoroscopic guidance first targeting the S1 SA P./sacral ala junction, bone contact made and confirmed with lateral imaging.  motor stimulation at 2 Hz confirm proper needle location followed by injection of one cc of a solution containing   2% lidocaine MPF. Radio frequency 80C for 80 seconds was performed. Then the inferolateral aspect of the S1, S2 and lateral aspect of S3 sacral foramina were targeted. Bone contact made.  motor stim at 2 Hz confirm proper needle location. One ML of the /lidocaine solution was injected into each of 3 sites and radio frequency ablation 80C for 90 seconds was performed. Patient tolerated procedure well. Post procedure instructions given  

## 2022-07-21 ENCOUNTER — Telehealth: Payer: Self-pay

## 2022-07-21 NOTE — Telephone Encounter (Signed)
Patient had the  Left Sacroiliac Radiofrequency on last Friday.   She stated the pain returned on Saturday. Its the same pain she had prior to the procedure. Back pain is at a level 10 out of 10,  from the left side down to here left leg. The only way she can get around is with the back brace.    Please advise. Call back phone [phone].

## 2022-07-22 NOTE — Telephone Encounter (Signed)
Patient lvm stating pain is a little better this morning since doubling up on pain meds. She has not had to put the brace on. Call back # [phone].

## 2022-07-23 ENCOUNTER — Other Ambulatory Visit: Payer: Self-pay | Admitting: Pharmacist

## 2022-07-23 ENCOUNTER — Telehealth: Payer: Self-pay | Admitting: Pharmacist

## 2022-07-23 NOTE — Telephone Encounter (Signed)
Patient needs to be transitioned from [facility] to [facility] infusion center for [employer] injection. Due for injecton 6/10. Referral placed. Called pt and LVM for her to call back. Will need to cancel apt at hospital.

## 2022-07-24 ENCOUNTER — Telehealth: Payer: Self-pay | Admitting: *Deleted

## 2022-07-24 NOTE — Telephone Encounter (Signed)
Submitted PA [employer] on covermymeds. Key: [id number]. PA approved  03/10/2022 - 07/24/2023.

## 2022-07-25 ENCOUNTER — Telehealth: Payer: Self-pay | Admitting: Pharmacy Technician

## 2022-07-25 NOTE — Telephone Encounter (Addendum)
 [name] note:  Auth Submission: NO AUTH NEEDED Site of care: Site of care: [facility] Payer: medicare a/b & supp Medication & CPT/J Code(s) submitted: Leqvio  (Inclisiran) J1306 Route of submission (phone, fax, portal):  Phone # Fax # Auth type: Buy/Bill Units/visits requested: 2 Reference number:  Approval from: 07/25/22 to 04/09/24  Medicare will cover 80% and supp will pick-up remaining 20%

## 2022-07-28 NOTE — Telephone Encounter (Signed)
Spoke with patient. She was made aware of the change. Said someone called her earlier but she was confused. Gave her the phone # to [employer] and she will call the schedule.

## 2022-07-31 ENCOUNTER — Telehealth: Payer: Self-pay

## 2022-07-31 ENCOUNTER — Ambulatory Visit: Payer: Medicare Other

## 2022-07-31 DIAGNOSIS — M542 Cervicalgia: Secondary | ICD-10-CM | POA: Diagnosis not present

## 2022-07-31 NOTE — Telephone Encounter (Signed)
Patient calls nurse line requesting to reschedule appointment for today. She reports that she was scheduled for urology appointment for the same time.   Rescheduled patient for Tuesday morning with Dr. Anner Crete.   Discussed with patient reason for visit. She reports that she has been having episodes where she "passes out" since Saturday. Reports three total episodes. Has not had an episode since Tuesday.   Advised patient of supportive measures and discussed in depth ED precautions.   Patient verbalizes understanding and will follow up in clinic on Tuesday morning.   Veronda Prude, RN

## 2022-08-05 ENCOUNTER — Ambulatory Visit (INDEPENDENT_AMBULATORY_CARE_PROVIDER_SITE_OTHER): Payer: Medicare Other | Admitting: Family Medicine

## 2022-08-05 ENCOUNTER — Ambulatory Visit (INDEPENDENT_AMBULATORY_CARE_PROVIDER_SITE_OTHER): Payer: Medicare Other | Admitting: Cardiovascular Disease

## 2022-08-05 ENCOUNTER — Encounter: Payer: Self-pay | Admitting: Family Medicine

## 2022-08-05 ENCOUNTER — Encounter (HOSPITAL_BASED_OUTPATIENT_CLINIC_OR_DEPARTMENT_OTHER): Payer: Self-pay | Admitting: Cardiovascular Disease

## 2022-08-05 ENCOUNTER — Encounter: Payer: Self-pay | Admitting: *Deleted

## 2022-08-05 VITALS — BP 122/88 | HR 75 | Ht 65.0 in | Wt 177.0 lb

## 2022-08-05 VITALS — BP 130/66 | HR 86 | Ht 65.0 in | Wt 176.0 lb

## 2022-08-05 DIAGNOSIS — I6523 Occlusion and stenosis of bilateral carotid arteries: Secondary | ICD-10-CM | POA: Diagnosis not present

## 2022-08-05 DIAGNOSIS — I10 Essential (primary) hypertension: Secondary | ICD-10-CM | POA: Diagnosis not present

## 2022-08-05 DIAGNOSIS — R55 Syncope and collapse: Secondary | ICD-10-CM | POA: Insufficient documentation

## 2022-08-05 DIAGNOSIS — R011 Cardiac murmur, unspecified: Secondary | ICD-10-CM | POA: Diagnosis not present

## 2022-08-05 DIAGNOSIS — I700 Atherosclerosis of aorta: Secondary | ICD-10-CM | POA: Diagnosis not present

## 2022-08-05 MED ORDER — PREDNISONE 50 MG PO TABS: ORAL_TABLET | ORAL | 0 refills | Status: DC

## 2022-08-05 NOTE — Assessment & Plan Note (Addendum)
3 syncopal episodes in the past week without prodrome.  EKG in the office unremarkable.  Normal neuroexam.  Not orthostatic.  Presentation worrisome for cardiac etiology, but differential remains broad at this point and other potential causes include medication effect, TIA, atypical migraine, less likely orthostatic. Nothing to suggest seizure. -Was able to get patient an appointment with her cardiologist this morning -Check basic labs (CBC, CMP) -Advised neuro follow up and discussion of tapering doxepin with them -Avoid driving for now

## 2022-08-05 NOTE — Progress Notes (Signed)
Cardiology Office Note   Date:  08/05/2022   ID:  [name] [birth year], MRN [id number]  PCP:  Courtney Alley, DO  Cardiologist:   Courtney Si, MD   No chief complaint on file.    History of Present Illness: [name] is a 72 y.o. female with asymptomatic coronary calcification, L subclavian stenosis, OSA on CPAP, prior tobacco abuse, hypertension, hyperlipidemia, CKD 3, and stroke here for follow-up.  She was admitted 07/2017 with shortness of breath.  LE Doppler was negative for DVT.  V/Q scan showed a wedge shaped perfusion defect but was deemed intermediate probability.  There was no evidence of RV strain on echo.  She was started on Eliquis for anticoagulation.  She followed up with [name] on 08/2017 and was referred for [name] to rule out ischemia.  Lexiscan Myoview 09/2017 revealed LVEF 66% and no ischemia.  [name] previously smoked for 25-30 years.  She also had a lung puncture injury that required repeat thoracentesis and drain placement.   She has not tolerated several statins in the past.  She was seen in [facility] and started on Inclisiran.   [name] saw Courtney Shields, [name] on 09/2021 and was doing well. She presents today reporting 3 episodes of syncope.  The first occurred while she was folding clothes, the second while preparing for work, and the third when she was about to turn off the TV. She notes that these episodes happen suddenly without any prior warning signs such as chest pain or palpitations, although she does mention a constant slight headache due to her migraines. She does not recall feeling lightheaded or experiencing any unusual sensations in her chest during these episodes.  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 She reports swelling in her legs and feet, which she manages with a diuretic; the  swelling decreases after resting her legs.  Additionally, [name] is currently on medication, including a diuretic for her swelling and inclisiran for cholesterol management. She confirms that she is coping well with the inclisiran treatment, appreciating the less frequent dosing required.   Past Medical History:  Diagnosis Date   Anemia    Cervical neuritis    Chronic kidney disease    stage 3   Dyslipidemia    GERD (gastroesophageal reflux disease)    History of transient ischemic attack (TIA)    HTN (hypertension)    Hyperlipidemia    Migraine    Migraine headache    Occipital neuralgia    Left   Pulmonary embolism (HCC) 2017   Renal insufficiency    Stroke (HCC) 10/2013     Current Outpatient Medications  Medication Sig Dispense Refill   albuterol (VENTOLIN HFA) 108 (90 Base) MCG/ACT inhaler Inhale 2 puffs into the lungs every 6 (six) hours as needed for wheezing or shortness of breath. 8 g 6   amLODipine (NORVASC) 5 MG tablet TAKE 1 TABLET (5 MG TOTAL) BY MOUTH DAILY. 90 tablet 3   cetirizine (ZYRTEC) 10 MG tablet Take 1 tablet (10 mg total) by mouth daily with breakfast. 30 tablet 0   clopidogrel (PLAVIX) 75 MG tablet TAKE 1 TABLET BY MOUTH EVERY DAY 90 tablet 3   doxepin (SINEQUAN) 100 MG capsule Take 2 capsules (200 mg total) by mouth at bedtime. 180 capsule 0   ezetimibe (ZETIA) 10 MG tablet TAKE  1 TABLET BY MOUTH EVERY DAY 90 tablet 3   fluticasone (FLONASE) 50 MCG/ACT nasal spray Place 1 spray into both nostrils daily. 15.8 mL 2   furosemide (LASIX) 40 MG tablet Take 1 tablet (40 mg total) by mouth daily. 30 tablet 2   gabapentin (NEURONTIN) 600 MG tablet Take 1 tablet (600 mg total) by mouth 2 (two) times daily. 60 tablet 6   inclisiran (LEQVIO) 284 MG/1.5ML SOSY injection Inject as directed 1.5 mL 0   losartan (COZAAR) 25 MG tablet TAKE 1 TABLET BY MOUTH EVERYDAY AT BEDTIME 90 tablet 0   ondansetron (ZOFRAN) 4 MG tablet Take 1 tablet (4 mg total) by mouth every  8 (eight) hours as needed for nausea or vomiting. 20 tablet 0   pantoprazole (PROTONIX) 40 MG tablet TAKE 1 TABLET(40 MG) BY MOUTH TWICE DAILY 180 tablet 1   predniSONE (DELTASONE) 50 MG tablet Take 1 tablet 13 hours prior to test, then one 7 hours prior to test, and then one 1 hour prior to test 3 tablet 0   Rimegepant Sulfate (NURTEC) 75 MG TBDP Take 75 mg by mouth daily as needed (take for abortive therapy of migraine, no more than 1 tablet in 24 hours or 10 per month). 8 tablet 11   rosuvastatin (CRESTOR) 5 MG tablet TAKE 1 TABLET (5 MG TOTAL) BY MOUTH DAILY. 90 tablet 3   sertraline (ZOLOFT) 100 MG tablet Take 1 tablet (100 mg total) by mouth daily. 90 tablet 3   tiZANidine (ZANAFLEX) 4 MG tablet Take 1 tablet (4 mg total) by mouth every 6 (six) hours as needed for muscle spasms. 180 tablet 1   traMADol (ULTRAM) 50 MG tablet TAKE 1 TABLET EVERY 8 HOURS AS NEEDED FOR MODERATE PAIN 90 tablet 0   traZODone (DESYREL) 50 MG tablet TAKE 1/2 TO 1 TABLET BY MOUTH AT BEDTIME AS NEEDED FOR SLEEP 90 tablet 0   zonisamide (ZONEGRAN) 100 MG capsule Take 1 capsule (100 mg total) by mouth 2 (two) times daily. 180 capsule 0   zonisamide (ZONEGRAN) 25 MG capsule take 25 mg twice daily prn in addition to 100 mg twice daily. 180 capsule 0   No current facility-administered medications for this visit.    Allergies:   Iodinated contrast media, Shellfish allergy, Crestor [rosuvastatin], Lipitor [atorvastatin], Statins, and Iodine    Social History:  The patient  reports that she quit smoking about 10 years ago. Her smoking use included cigarettes. She started smoking about 53 years ago. She has a 63.00 pack-year smoking history. She has never used smokeless tobacco. She reports that she does not drink alcohol and does not use drugs.   Family History:  The patient's family history includes Alzheimer's disease in her mother; Cancer in her brother, brother, and maternal grandfather; Cancer (age of onset: 49) in her  mother; Dementia in her brother and maternal grandmother; Hyperlipidemia in her brother and brother; Lung cancer in her father; Prostate cancer in her father; Sleep apnea in her brother.    ROS:  Please see the history of present illness.   Otherwise, review of systems are positive for none.   All other systems are reviewed and negative.    PHYSICAL EXAM: VS:  BP 130/66 (BP Location: Left Arm, Patient Position: Sitting, Cuff Size: Large)   Pulse 86   Ht 5\' 5"  (1.651 m)   Wt 176 lb (79.8 kg)   BMI 29.29 kg/m  , BMI Body mass index is 29.29 kg/m. GENERAL:  Well appearing  HEENT: Pupils equal round and reactive, fundi not visualized, oral mucosa unremarkable NECK:  No jugular venous distention, waveform within normal limits, carotid upstroke brisk and symmetric, no bruits LUNGS:  Clear to auscultation bilaterally HEART:  RRR.  PMI not displaced or sustained,S1 and S2 within normal limits, no S3, no S4, no clicks, no rubs, III/VI systolic murmur at the LUSB ABD:  Flat, positive bowel sounds normal in frequency in pitch, no bruits, no rebound, no guarding, no midline pulsatile mass, no hepatomegaly, no splenomegaly EXT:  2 plus pulses throughout, trace edema, no cyanosis no clubbing SKIN:  No rashes no nodules NEURO:  Cranial nerves II through XII grossly intact, motor grossly intact throughout PSYCH:  Cognitively intact, oriented to person place and time   EKG:  EKG is ordered today. 08/05/22: Sinus rhythm.  Rate 88 bpm.  Low voltage.  Cannot rule out prior anterior infarct.    Echo 07/24/17: Study Conclusions   - Left ventricle: The cavity size was normal. Systolic function was   normal. The estimated ejection fraction was in the range of 55%   to 60%. Wall motion was normal; there were no regional wall   motion abnormalities. Doppler parameters are consistent with   abnormal left ventricular relaxation (grade 1 diastolic   dysfunction).  Lexiscan Myoview 09/18/17:  The left  ventricular ejection fraction is hyperdynamic (>65%). Nuclear stress EF: 66%. There was no ST segment deviation noted during stress. The study is normal. This is a low risk study.   Low risk stress nuclear study with normal perfusion and normal left ventricular regional and global systolic function.      Recent Labs: 08/06/2021: ALT 23 01/07/2022: BUN 18; Creatinine, Ser 1.20; Potassium 3.9; Sodium 141    Lipid Panel    Component Value Date/Time   CHOL 143 08/06/2021 0842   TRIG 133 08/06/2021 0842   HDL 61 08/06/2021 0842   CHOLHDL 2.3 08/06/2021 0842   CHOLHDL 2.2 07/13/2015 1056   VLDL 10 07/13/2015 1056   LDLCALC 59 08/06/2021 0842   LDLDIRECT 156 (H) 03/27/2017 0939     07/30/2017: Hemoglobin A1c 5.5% Hemoglobin 12.9, platelets 196 09/25/2017: Potassium 3.9, creatinine 1.4  Wt Readings from Last 3 Encounters:  08/05/22 176 lb (79.8 kg)  08/05/22 177 lb (80.3 kg)  07/18/22 179 lb (81.2 kg)      ASSESSMENT AND PLAN:  # Syncope: # Carotid stenosis: # Subclavian stenosis: Patient reported three recent blackout spells with no warning signs or chest pain. Occurred after turning her head.  EKG results were normal according to the patient. - Order CT scan of neck and chest to assess  for vertebral or carotid stenosis.  Re-evaluate her known subclavian stenosis. - Perform echocardiogram to assess cardiac structure and function. - Initiate 30-day heart monitor to track heart rhythm and identify any dangerous rhythms. - Advise patient not to drive until the cause of syncope is determined.  # Hyperlipidemia: Patient is currently on inclisiran therapy. - Order lipid panel to assess cholesterol levels. - Instruct patient to fast before cholesterol testing. - Continue Zetia, inclisiran and rosuvastatin.  # Hypertension:  BP stable.  She is not orthostatic.  Continue amlodipine and losartan.  Current medicines are reviewed at length with the patient today.  The patient  does not have concerns regarding medicines.  The following changes have been made: Resume Praluent  Labs/ tests ordered today include:   Orders Placed This Encounter  Procedures   CT ANGIO NECK W OR WO  CONTRAST   CT ANGIO CHEST AORTA W/CM & OR WO/CM   Lipid panel   Comprehensive metabolic panel   CARDIAC EVENT MONITOR   ECHOCARDIOGRAM COMPLETE     Disposition:   FU with Ivy Puryear C. Duke Salvia, MD, [name] in 2 months.    Signed, Linzy Laury C. Duke Salvia, MD, [facility]  08/05/2022 10:48 AM    [facility]

## 2022-08-05 NOTE — Progress Notes (Signed)
Patient enrolled for [employer] to ship a 30 day cardiac event monitor t her address on file.

## 2022-08-05 NOTE — Patient Instructions (Addendum)
Medication Instructions:  Prednisone 50 mg - take 13 hours prior to test Take another Prednisone 50 mg 7 hours prior to test Take another Prednisone 50 mg 1 hour prior to test Take Benadryl 50 mg 1 hour prior to test Patient must complete all four doses of above prophylactic medications. Patient will need a ride after test due to Benadryl.  *If you need a refill on your cardiac medications before your next appointment, please call your pharmacy*   Lab Work: FASTING LP/CMET PRIOR TO CT'S   If you have labs (blood work) drawn today and your tests are completely normal, you will receive your results only by: MyChart Message (if you have MyChart) OR A paper copy in the mail If you have any lab test that is abnormal or we need to change your treatment, we will call you to review the results.   Testing/Procedures: Non-Cardiac CT Angiography (CTA), is a special type of CT scan that uses a computer to produce multi-dimensional views of major blood vessels throughout the body. In CT angiography, a contrast material is injected through an IV to help visualize the blood vessels OF NECK AND CHEST   Your physician has requested that you have an echocardiogram. Echocardiography is a painless test that uses sound waves to create images of your heart. It provides your doctor with information about the size and shape of your heart and how well your heart's chambers and valves are working. This procedure takes approximately one hour. There are no restrictions for this procedure. Please do NOT wear cologne, perfume, aftershave, or lotions (deodorant is allowed). Please arrive 15 minutes prior to your appointment time.  Your physician has recommended that you wear an event monitor. Event monitors are medical devices that record the heart's electrical activity. Doctors most often [place] these monitors to diagnose arrhythmias. Arrhythmias are problems with the speed or rhythm of the heartbeat. The monitor is a  small, portable device. You can wear one while you do your normal daily activities. This is usually used to diagnose what is causing palpitations/syncope (passing out). 30 DAY   Follow-Up: At [facility], you and your health needs are our priority.  As part of our continuing mission to provide you with exceptional heart care, we have created designated Provider Care Teams.  These Care Teams include your primary Cardiologist (physician) and Advanced Practice Providers (APPs -  Physician Assistants and Nurse Practitioners) who all work together to provide you with the care you need, when you need it.  We recommend signing up for the patient portal called "MyChart".  Sign up information is provided on this After Visit Summary.  MyChart is used to connect with patients for Virtual Visits (Telemedicine).  Patients are able to view lab/test results, encounter notes, upcoming appointments, etc.  Non-urgent messages can be sent to your provider as well.   To learn more about what you can do with MyChart, go to [email or web address].    Your next appointment:   6-8 week(s)  Provider:   Gillian Shields, NP    [address] [facility]  Other Instructions  [employer] Cardiac Event Monitor Instructions  Your physician has requested you wear your cardiac event monitor for _30_ days, (1-30). [employer] may call or text to confirm a shipping address. The monitor will be sent to a land address via UPS. [employer] will not ship a monitor to a PO BOX. It typically takes 3-5 days to receive your monitor after it has been enrolled. [employer] will assist  with USPS tracking if your package is delayed. The telephone number for [employer] is [phone]. Once you have received your monitor, please review the enclosed instructions. Instruction tutorials can also be viewed under help and settings on the enclosed cell phone. Your monitor has already been registered assigning a specific monitor serial # to  you.  Billing and Self Pay Discount Information  [employer] has been provided the insurance information we had on file for you.  If your insurance has been updated, please call [employer] at [phone] to provide them with your updated insurance information.   [employer] offers a discounted Self Pay option for patients who have insurance that does not cover their cardiac event monitor or patients without insurance.  The discounted cost of a Self Pay Cardiac Event Monitor would be $225.00 , if the patient contacts [employer] at [phone] within 7 days of applying the monitor to make payment arrangements.  If the patient does not contact [employer] within 7 days of applying the monitor, the cost of the cardiac event monitor will be $350.00.  Applying the monitor  Remove cell phone from case and turn it on. The cell phone works as IT consultant and needs to be within [employer] of you at all times. The cell phone will need to be charged on a daily basis. We recommend you plug the cell phone into the enclosed charger at your bedside table every night.  Monitor batteries: You will receive two monitor batteries labelled #1 and #2. These are your recorders. Plug battery #2 onto the second connection on the enclosed charger. Keep one battery on the charger at all times. This will keep the monitor battery deactivated. It will also keep it fully charged for when you need to switch your monitor batteries. A small light will be blinking on the battery emblem when it is charging. The light on the battery emblem will remain on when the battery is fully charged.  Open package of a Monitor strip. Insert battery #1 into black hood on strip and gently squeeze monitor battery onto connection as indicated in instruction booklet. Set aside while preparing skin.  Choose location for your strip, vertical or horizontal, as indicated in the instruction booklet. Shave to remove all hair from location. There cannot  be any lotions, oils, powders, or colognes on skin where monitor is to be applied. Wipe skin clean with enclosed Saline wipe. Dry skin completely.  Peel paper labeled #1 off the back of the Monitor strip exposing the adhesive. Place the monitor on the chest in the vertical or horizontal position shown in the instruction booklet. One arrow on the monitor strip must be pointing upward. Carefully remove paper labeled #2, attaching remainder of strip to your skin. Try not to create any folds or wrinkles in the strip as you apply it.  Firmly press and release the circle in the center of the monitor battery. You will hear a small beep. This is turning the monitor battery on. The heart emblem on the monitor battery will light up every 5 seconds if the monitor battery in turned on and connected to the patient securely. Do not push and hold the circle down as this turns the monitor battery off. The cell phone will locate the monitor battery. A screen will appear on the cell phone checking the connection of your monitor strip. This may read poor connection initially but change to good connection within the next minute. Once your monitor accepts the connection you will hear a series  of 3 beeps followed by a climbing crescendo of beeps. A screen will appear on the cell phone showing the two monitor strip placement options. Touch the picture that demonstrates where you applied the monitor strip.  Your monitor strip and battery are waterproof. You are able to shower, bathe, or swim with the monitor on. They just ask you do not submerge deeper than 3 feet underwater. We recommend removing the monitor if you are swimming in a lake, river, or ocean.  Your monitor battery will need to be switched to a fully charged monitor battery approximately once a week. The cell phone will alert you of an action which needs to be made.  On the cell phone, tap for details to reveal connection status, monitor battery status,  and cell phone battery status. The green dots indicates your monitor is in good status. A red dot indicates there is something that needs your attention.  To record a symptom, click the circle on the monitor battery. In 30-60 seconds a list of symptoms will appear on the cell phone. Select your symptom and tap save. Your monitor will record a sustained or significant arrhythmia regardless of you clicking the button. Some patients do not feel the heart rhythm irregularities. [employer] will notify us of any serious or critical events.  Refer to instruction booklet for instructions on switching batteries, changing strips, the Do not disturb or Pause features, or any additional questions.  Call [employer] at [phone], to confirm your monitor is transmitting and record your baseline. They will answer any questions you may have regarding the monitor instructions at that time.  Returning the monitor to [employer]  Place all equipment back into blue box. Peel off strip of paper to expose adhesive and close box securely. There is a prepaid UPS shipping label on this box. Drop in a UPS drop box, or at a UPS facility like [employer]. You may also contact [employer] to arrange UPS to pick up monitor package at your home.

## 2022-08-05 NOTE — Progress Notes (Signed)
    SUBJECTIVE:   CHIEF COMPLAINT / HPI:   Syncope Patient had 3 syncopal episodes over the past week.  No prodromal symptoms.  She denies chest pain, palpitations, dizziness, lightheadedness, vision changes, diaphoresis, or any other symptoms.  Maybe slight headache, but nothing out of the ordinary for her.  First episode she was folding laundry in the evening, went to open dryer door, next thing she remembers she woke up on the floor.  Second and third episodes were similar-was getting dressed in the morning and remembers waking up on the floor.  Did not seek care afterwards. Felt her usual self and went about her day normally.  Lives alone.  Does not think she was passed out for long, maybe a few minutes.  On Plavix for L vertebral artery stenosis.  No other blood thinners. Has long list of medications but very familiar with all of them and no changes recently. Follows with cardiology and neurology. H/o TIA in 2015 (states symptoms were headache, chest pain, and passing out)  PERTINENT  PMH / PSH: Migraine, possible PE, PVD (L vertebral artery stenosis), HTN, HLD, OSA, CKD stage 3, intercostal neuralgia, anxiety  OBJECTIVE:   BP 122/88   Pulse 75   Ht 5\' 5"  (1.651 m)   Wt 177 lb (80.3 kg)   SpO2 93%   BMI 29.45 kg/m   Orthostatic VS for the past 72 hrs (Last 3 readings):  Orthostatic BP Patient Position BP Location Cuff Size Orthostatic Pulse  08/05/22 0905 146/83 Standing Left Arm Normal 83  08/05/22 0903 138/86 Sitting Left Arm Normal 83  08/05/22 0902 149/85 Supine Left Arm Normal 83  Gen: NAD, pleasant, able to participate in exam CV: RRR, normal S1/S2, II/VI systolic murmur LUSB, no carotid bruit Resp: Normal effort, lungs CTAB Extremities: no edema or cyanosis Skin: warm and dry, no rashes noted Neuro: alert, CN II-XII intact, 5/5 strength in all extremities, sensation intact to light touch throughout, 2+ reflexes, normal gait Psych: Normal affect and  mood   ASSESSMENT/PLAN:   Syncope and collapse 3 syncopal episodes in the past week without prodrome.  EKG in the office unremarkable.  Normal neuroexam.  Not orthostatic.  Presentation worrisome for cardiac etiology, but differential remains broad at this point and other potential causes include medication effect, TIA, atypical migraine, less likely orthostatic. Nothing to suggest seizure. -Was able to get patient an appointment with her cardiologist this morning -Check basic labs (CBC, CMP) -Advised neuro follow up and discussion of tapering doxepin with them -Avoid driving for now     Maury Dus, MD [facility]

## 2022-08-05 NOTE — Patient Instructions (Addendum)
It was great to see you!  Things we discussed at today's visit: - Go to your cardiologist's office [phone] [facility]) for an appointment this morning at 9:40am - We are checking some labs. I will send you a MyChart message with the results or call if needed  - I recommend tapering your doxepin. Please discuss this with the prescribing provider. - Keep your phone on you at all times and I would not drive until we figure out your episodes of passing out  Take care and seek immediate care sooner if you develop any concerns.  Dr. [facility]

## 2022-08-06 DIAGNOSIS — I10 Essential (primary) hypertension: Secondary | ICD-10-CM | POA: Diagnosis not present

## 2022-08-06 DIAGNOSIS — R55 Syncope and collapse: Secondary | ICD-10-CM | POA: Diagnosis not present

## 2022-08-06 LAB — COMPREHENSIVE METABOLIC PANEL
ALT: 20 IU/L (ref 0–32)
ALT: 24 IU/L (ref 0–32)
AST: 23 IU/L (ref 0–40)
AST: 28 IU/L (ref 0–40)
Albumin/Globulin Ratio: 1.8 (ref 1.2–2.2)
Albumin/Globulin Ratio: 2 (ref 1.2–2.2)
Albumin: 4.5 g/dL (ref 3.9–4.9)
Albumin: 4.7 g/dL (ref 3.9–4.9)
Alkaline Phosphatase: 152 IU/L — ABNORMAL HIGH (ref 44–121)
Alkaline Phosphatase: 162 IU/L — ABNORMAL HIGH (ref 44–121)
BUN/Creatinine Ratio: 13 (ref 12–28)
BUN/Creatinine Ratio: 11 — ABNORMAL LOW (ref 12–28)
BUN: 15 mg/dL (ref 8–27)
BUN: 13 mg/dL (ref 8–27)
Bilirubin Total: 0.2 mg/dL (ref 0.0–1.2)
Bilirubin Total: 0.2 mg/dL (ref 0.0–1.2)
CO2: 25 mmol/L (ref 20–29)
CO2: 24 mmol/L (ref 20–29)
Calcium: 9.5 mg/dL (ref 8.7–10.3)
Calcium: 9.4 mg/dL (ref 8.7–10.3)
Chloride: 102 mmol/L (ref 96–106)
Chloride: 102 mmol/L (ref 96–106)
Creatinine, Ser: 1.13 mg/dL — ABNORMAL HIGH (ref 0.57–1.00)
Creatinine, Ser: 1.19 mg/dL — ABNORMAL HIGH (ref 0.57–1.00)
Globulin, Total: 2.5 g/dL (ref 1.5–4.5)
Globulin, Total: 2.3 g/dL (ref 1.5–4.5)
Glucose: 106 mg/dL — ABNORMAL HIGH (ref 70–99)
Glucose: 126 mg/dL — ABNORMAL HIGH (ref 70–99)
Potassium: 4.5 mmol/L (ref 3.5–5.2)
Potassium: 4 mmol/L (ref 3.5–5.2)
Sodium: 144 mmol/L (ref 134–144)
Sodium: 143 mmol/L (ref 134–144)
Total Protein: 7 g/dL (ref 6.0–8.5)
Total Protein: 7 g/dL (ref 6.0–8.5)
eGFR: 52 mL/min/{1.73_m2} — ABNORMAL LOW (ref >59)
eGFR: 49 mL/min/{1.73_m2} — ABNORMAL LOW (ref >59)

## 2022-08-06 LAB — CBC
Hematocrit: 41.6 % (ref 34.0–46.6)
Hemoglobin: 12.9 g/dL (ref 11.1–15.9)
MCH: 26.8 pg (ref 26.6–33.0)
MCHC: 31 g/dL — ABNORMAL LOW (ref 31.5–35.7)
MCV: 86 fL (ref 79–97)
Platelets: 270 10*3/uL (ref 150–450)
RBC: 4.82 x10E6/uL (ref 3.77–5.28)
RDW: 14.8 % (ref 11.7–15.4)
WBC: 7.6 10*3/uL (ref 3.4–10.8)

## 2022-08-06 LAB — LIPID PANEL
Chol/HDL Ratio: 1.3 ratio (ref 0.0–4.4)
Cholesterol, Total: 106 mg/dL (ref 100–199)
HDL: 82 mg/dL (ref >39)
LDL Chol Calc (NIH): 10 mg/dL (ref 0–99)
Triglycerides: 66 mg/dL (ref 0–149)
VLDL Cholesterol Cal: 14 mg/dL (ref 5–40)

## 2022-08-08 ENCOUNTER — Other Ambulatory Visit: Payer: Self-pay | Admitting: Student

## 2022-08-11 ENCOUNTER — Ambulatory Visit (HOSPITAL_BASED_OUTPATIENT_CLINIC_OR_DEPARTMENT_OTHER)
Admission: RE | Admit: 2022-08-11 | Discharge: 2022-08-11 | Disposition: A | Discharge status: Home / Self Care | Payer: Medicare Other | Source: Ambulatory Visit | Attending: Cardiovascular Disease | Admitting: Cardiovascular Disease

## 2022-08-11 DIAGNOSIS — R55 Syncope and collapse: Secondary | ICD-10-CM | POA: Diagnosis not present

## 2022-08-11 DIAGNOSIS — I6503 Occlusion and stenosis of bilateral vertebral arteries: Secondary | ICD-10-CM | POA: Diagnosis not present

## 2022-08-11 DIAGNOSIS — I6523 Occlusion and stenosis of bilateral carotid arteries: Secondary | ICD-10-CM | POA: Diagnosis not present

## 2022-08-11 DIAGNOSIS — I700 Atherosclerosis of aorta: Secondary | ICD-10-CM | POA: Insufficient documentation

## 2022-08-11 DIAGNOSIS — I2510 Atherosclerotic heart disease of native coronary artery without angina pectoris: Secondary | ICD-10-CM | POA: Diagnosis not present

## 2022-08-11 MED ORDER — IOHEXOL 350 MG/ML SOLN
100.0000 mL | Freq: Once | INTRAVENOUS | Status: AC | PRN
  Administered 2022-08-11: 80 mL via INTRAVENOUS

## 2022-08-18 ENCOUNTER — Ambulatory Visit (INDEPENDENT_AMBULATORY_CARE_PROVIDER_SITE_OTHER): Payer: Medicare Other

## 2022-08-18 ENCOUNTER — Encounter (HOSPITAL_COMMUNITY): Payer: Medicare Other

## 2022-08-18 VITALS — BP 129/80 | HR 83 | Temp 98.1°F | Resp 18 | Ht 66.0 in | Wt 177.0 lb

## 2022-08-18 DIAGNOSIS — E785 Hyperlipidemia, unspecified: Secondary | ICD-10-CM | POA: Diagnosis not present

## 2022-08-18 DIAGNOSIS — G459 Transient cerebral ischemic attack, unspecified: Secondary | ICD-10-CM

## 2022-08-18 DIAGNOSIS — I739 Peripheral vascular disease, unspecified: Secondary | ICD-10-CM

## 2022-08-18 MED ORDER — INCLISIRAN SODIUM 284 MG/1.5ML ~~LOC~~ SOSY
284.0000 mg | PREFILLED_SYRINGE | Freq: Once | SUBCUTANEOUS | Status: AC
  Administered 2022-08-18: 284 mg via SUBCUTANEOUS
  Filled 2022-08-18: qty 1.5

## 2022-08-18 NOTE — Progress Notes (Signed)
Diagnosis: Hyperlipidemia  Provider:  Chilton Greathouse MD  Procedure: Injection  Leqvio (inclisiran), Dose: 284 mg, Site: subcutaneous, Number of injections: 1  Post Care:     Discharge: Condition: Good, Destination: Home . AVS Declined  Performed by:  Garnette Czech, RN

## 2022-08-26 ENCOUNTER — Other Ambulatory Visit: Payer: Self-pay | Admitting: Student

## 2022-08-27 DIAGNOSIS — I129 Hypertensive chronic kidney disease with stage 1 through stage 4 chronic kidney disease, or unspecified chronic kidney disease: Secondary | ICD-10-CM | POA: Diagnosis not present

## 2022-08-27 DIAGNOSIS — N2581 Secondary hyperparathyroidism of renal origin: Secondary | ICD-10-CM | POA: Diagnosis not present

## 2022-08-27 DIAGNOSIS — N1831 Chronic kidney disease, stage 3a: Secondary | ICD-10-CM | POA: Diagnosis not present

## 2022-08-27 DIAGNOSIS — D631 Anemia in chronic kidney disease: Secondary | ICD-10-CM | POA: Diagnosis not present

## 2022-08-27 DIAGNOSIS — N189 Chronic kidney disease, unspecified: Secondary | ICD-10-CM | POA: Diagnosis not present

## 2022-08-27 DIAGNOSIS — N390 Urinary tract infection, site not specified: Secondary | ICD-10-CM | POA: Diagnosis not present

## 2022-08-27 LAB — LAB REPORT - SCANNED: EGFR: 52

## 2022-09-04 ENCOUNTER — Ambulatory Visit (INDEPENDENT_AMBULATORY_CARE_PROVIDER_SITE_OTHER): Payer: Medicare Other

## 2022-09-04 ENCOUNTER — Other Ambulatory Visit (HOSPITAL_COMMUNITY): Payer: Self-pay

## 2022-09-04 DIAGNOSIS — I10 Essential (primary) hypertension: Secondary | ICD-10-CM

## 2022-09-04 DIAGNOSIS — R55 Syncope and collapse: Secondary | ICD-10-CM | POA: Diagnosis not present

## 2022-09-04 DIAGNOSIS — R011 Cardiac murmur, unspecified: Secondary | ICD-10-CM

## 2022-09-04 LAB — ECHOCARDIOGRAM COMPLETE
AR max vel: 1.56 cm2
AV Area VTI: 1.52 cm2
AV Area mean vel: 1.48 cm2
AV Mean grad: 8 mmHg
AV Peak grad: 14.7 mmHg
Ao pk vel: 1.92 m/s
Area-P 1/2: 3.48 cm2
Calc EF: 62.6 %
P 1/2 time: 446 msec
S' Lateral: 2.75 cm
Single Plane A2C EF: 69.5 %
Single Plane A4C EF: 59.4 %

## 2022-09-06 DIAGNOSIS — Z23 Encounter for immunization: Secondary | ICD-10-CM | POA: Diagnosis not present

## 2022-09-08 ENCOUNTER — Encounter (HOSPITAL_COMMUNITY)
Admission: RE | Admit: 2022-09-08 | Discharge: 2022-09-08 | Disposition: A | Discharge status: Home / Self Care | Payer: Medicare Other | Source: Ambulatory Visit | Attending: Nephrology | Admitting: Nephrology

## 2022-09-08 DIAGNOSIS — D631 Anemia in chronic kidney disease: Secondary | ICD-10-CM | POA: Diagnosis not present

## 2022-09-08 DIAGNOSIS — N189 Chronic kidney disease, unspecified: Secondary | ICD-10-CM | POA: Insufficient documentation

## 2022-09-08 MED ORDER — SODIUM CHLORIDE 0.9 % IV SOLN
510.0000 mg | INTRAVENOUS | Status: DC
  Administered 2022-09-08: 510 mg via INTRAVENOUS
  Filled 2022-09-08: qty 510

## 2022-09-15 ENCOUNTER — Encounter (HOSPITAL_COMMUNITY)
Admission: RE | Admit: 2022-09-15 | Discharge: 2022-09-15 | Disposition: A | Discharge status: Home / Self Care | Payer: Medicare Other | Source: Ambulatory Visit | Attending: Nephrology | Admitting: Nephrology

## 2022-09-15 DIAGNOSIS — N189 Chronic kidney disease, unspecified: Secondary | ICD-10-CM | POA: Diagnosis not present

## 2022-09-15 DIAGNOSIS — D631 Anemia in chronic kidney disease: Secondary | ICD-10-CM | POA: Diagnosis not present

## 2022-09-15 MED ORDER — SODIUM CHLORIDE 0.9 % IV SOLN
510.0000 mg | INTRAVENOUS | Status: AC
  Administered 2022-09-15: 510 mg via INTRAVENOUS
  Filled 2022-09-15: qty 510

## 2022-09-18 ENCOUNTER — Encounter: Payer: Self-pay | Admitting: Physical Medicine & Rehabilitation

## 2022-09-18 ENCOUNTER — Encounter: Payer: Medicare Other | Attending: Physical Medicine & Rehabilitation | Admitting: Physical Medicine & Rehabilitation

## 2022-09-18 VITALS — BP 148/91 | HR 103 | Ht 66.0 in | Wt 175.0 lb

## 2022-09-18 DIAGNOSIS — M48061 Spinal stenosis, lumbar region without neurogenic claudication: Secondary | ICD-10-CM | POA: Insufficient documentation

## 2022-09-18 MED ORDER — GABAPENTIN 600 MG PO TABS: 600.0000 mg | ORAL_TABLET | Freq: Three times a day (TID) | ORAL | 3 refills | Status: DC

## 2022-09-18 NOTE — Progress Notes (Signed)
Subjective:    Patient ID: [name], female    DOB: 06-09-1950, 72 y.o.   MRN: [id number] 07/18/2022 Sacroiliac radio frequency ablation under fluoroscopic guidance This consists of LEFT L5 dorsal ramus radio frequency ablation plus RF of  LEFT S1-S2 -S3 lateral branches HPI 72 year old female with chronic low back pain.  She has a history of pelvic fracture as well as spinal stenosis.  She also has cervical spinal stenosis he is status post ACDF per Dr. Shon Baton.  She has had good relief with sacroiliac radiofrequency neurotomies.  Her last right-sided SI neurotomy was in October 2023.  She is still having good relief with that procedure Last SI RF 07/18/22 felt good right after procedure but pain returned that night and woke pt up.  Since that time she has had pain mainly with walking and standing the pain is from her buttock area low back into her left thigh mainly posteriorly.  Her pain is severe.  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 She notes that after the gabapentin her pain eases up and her low back buttock and thigh area.  Pain starts coming back several hours after the gabapentin dose.  Now taking tramadol 50mg  qam  Pain Inventory Average Pain 9 Pain Right Now 9 My pain is constant, sharp, burning, stabbing, tingling, and aching  In the last 24 hours, has pain interfered with the following? General activity 8 Relation with others 8 Enjoyment of life 8 What TIME of day is your pain at its worst? morning , daytime, evening, and night Sleep (in general) Poor  Pain is worse with: walking, bending, standing, and some activites Pain improves with: rest, heat/ice, and medication Relief from Meds: 5  Family History  Problem Relation Age of Onset   Cancer Mother 36       breast   Alzheimer's disease Mother    Prostate cancer Father    Lung cancer Father    Cancer Brother     Hyperlipidemia Brother    Sleep apnea Brother    Cancer Brother    Hyperlipidemia Brother    Dementia Brother        frontotemporal dementia   Dementia Maternal Grandmother    Cancer Maternal Grandfather    Social History   Socioeconomic History   Marital status: Married    Spouse name: [name]   Number of children: 1   Years of education: 12+   Highest education level: High school graduate  Occupational History   Occupation: ACTIVITY DIRECTOR    Employer: [employer]   Occupation: CNA   Occupation: Retired  Tobacco Use   Smoking status: Former    Current packs/day: 0.00    Average packs/day: 1.5 packs/day for 42.6 years (64.0 ttl pk-yrs)    Types: Cigarettes    Start date: 03/10/1969    Quit date: 11/01/2011    Years since quitting: 10.8   Smokeless tobacco: Never   Tobacco comments:    Smoked 1.5-2 ppd for adult life (salem menthols)  Vaping Use   Vaping status: Never Used  Substance and Sexual Activity   Alcohol use: No    Alcohol/week: 0.0 standard drinks of alcohol   Drug use: No   Sexual activity: Yes    Partners: Male    Birth control/protection: Surgical    Comment: 1 sexual partner in last 12 months: married  Other Topics Concern  Not on file  Social History Narrative    Patient is married ([name]).   Patient has one child and grandchild. Her sons name is [name]. Patient sees them often.   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 Patient spends 6-10 hours with her 6 days a week.    Patient has a college education.   Patient is very active in her church.    Hobbies- outside crafts and church choir.   Social Determinants of Health   Financial Resource Strain: Low Risk  (06/19/2022)   Overall Financial Resource Strain (CARDIA)    Difficulty of Paying Living Expenses: Not hard at all  Food Insecurity: No Food Insecurity (06/19/2022)   Hunger Vital Sign    Worried About Running Out of Food in the Last Year:  Never true    Ran Out of Food in the Last Year: Never true  Transportation Needs: No Transportation Needs (06/19/2022)   PRAPARE - Administrator, Civil Service (Medical): No    Lack of Transportation (Non-Medical): No  Physical Activity: Sufficiently Active (06/19/2022)   Exercise Vital Sign    Days of Exercise per Week: 5 days    Minutes of Exercise per Session: 30 min  Stress: Stress Concern Present (06/19/2022)   [employer] of Occupational Health - Occupational Stress Questionnaire    Feeling of Stress : To some extent  Social Connections: Moderately Integrated (06/19/2022)   Social Connection and Isolation Panel [NHANES]    Frequency of Communication with Friends and Family: More than three times a week    Frequency of Social Gatherings with Friends and Family: Twice a week    Attends Religious Services: More than 4 times per year    Active Member of [employer] or Organizations: Yes    Attends Banker Meetings: Never    Marital Status: Widowed   Past Surgical History:  Procedure Laterality Date   ABDOMINAL HYSTERECTOMY     ANTERIOR CERVICAL DECOMP/DISCECTOMY FUSION N/A 05/24/2020   Procedure: ANTERIOR CERVICAL DECOMPRESSION/DISCECTOMY FUSION 1 LEVEL C3-4;  Surgeon: Venita Lick, MD;  Location: [facility] OR;  Service: Orthopedics;  Laterality: N/A;  3 hrs   BILIARY STENT PLACEMENT N/A 12/12/2015   Procedure: BILIARY STENT PLACEMENT;  Surgeon: Sherrilyn Rist, MD;  Location: [facility] ENDOSCOPY;  Service: Endoscopy;  Laterality: N/A;   BLADDER REPAIR     CARPAL TUNNEL RELEASE     CERVICAL FUSION     CERVICAL SPINE SURGERY     CHOLECYSTECTOMY N/A 12/06/2015   Procedure: LAPAROSCOPIC CHOLECYSTECTOMY WITH  INTRAOPERATIVE CHOLANGIOGRAM;  Surgeon: Almond Lint, MD;  Location: [facility] OR;  Service: General;  Laterality: N/A;   ELBOW SURGERY     ERCP N/A 12/12/2015   Procedure: ENDOSCOPIC RETROGRADE CHOLANGIOPANCREATOGRAPHY (ERCP);  Surgeon: Sherrilyn Rist, MD;  Location: [facility]  ENDOSCOPY;  Service: Endoscopy;  Laterality: N/A;   ESOPHAGOGASTRODUODENOSCOPY N/A 01/28/2016   Procedure: ESOPHAGOGASTRODUODENOSCOPY (EGD) Biliary STENT removal;  Surgeon: Sherrilyn Rist, MD;  Location: [facility] ENDOSCOPY;  Service: Gastroenterology;  Laterality: N/A;   IR GENERIC HISTORICAL  12/17/2015   IR US GUIDE BX ASP/DRAIN 12/17/2015 [facility]   IR GENERIC HISTORICAL  12/17/2015   IR GUIDED DRAIN W CATHETER PLACEMENT 12/17/2015 [facility]   IR GENERIC HISTORICAL  12/17/2015   IR SINUS/FIST TUBE CHK-NON GI 12/17/2015 [facility]   KNEE SURGERY     LUNG SURGERY     TEE WITHOUT CARDIOVERSION N/A 12/19/2015  Procedure: TRANSESOPHAGEAL ECHOCARDIOGRAM (TEE);  Surgeon: Wendall Stade, MD;  Location[facility] ENDOSCOPY;  Service: Cardiovascular;  Laterality: N/A;   Past Surgical History:  Procedure Laterality Date   ABDOMINAL HYSTERECTOMY     ANTERIOR CERVICAL DECOMP/DISCECTOMY FUSION N/A 05/24/2020   Procedure: ANTERIOR CERVICAL DECOMPRESSION/DISCECTOMY FUSION 1 LEVEL C3-4;  Surgeon: Venita Lick, MD;  Location: [facility] OR;  Service: Orthopedics;  Laterality: N/A;  3 hrs   BILIARY STENT PLACEMENT N/A 12/12/2015   Procedure: BILIARY STENT PLACEMENT;  Surgeon: Sherrilyn Rist, MD;  Location: [facility];  Service: Endoscopy;  Laterality: N/A;   BLADDER REPAIR     CARPAL TUNNEL RELEASE     CERVICAL FUSION     CERVICAL SPINE SURGERY     CHOLECYSTECTOMY N/A 12/06/2015   Procedure: LAPAROSCOPIC CHOLECYSTECTOMY WITH  INTRAOPERATIVE CHOLANGIOGRAM;  Surgeon: Almond Lint, MD;  Location: [facility];  Service: General;  Laterality: N/A;   ELBOW SURGERY     ERCP N/A 12/12/2015   Procedure: ENDOSCOPIC RETROGRADE CHOLANGIOPANCREATOGRAPHY (ERCP);  Surgeon: Sherrilyn Rist, MD;  Location[facility] ENDOSCOPY;  Service: Endoscopy;  Laterality: N/A;   ESOPHAGOGASTRODUODENOSCOPY N/A 01/28/2016   Procedure: ESOPHAGOGASTRODUODENOSCOPY (EGD) Biliary STENT removal;  Surgeon: Sherrilyn Rist, MD;  Location: [facility] ENDOSCOPY;   Service: Gastroenterology;  Laterality: N/A;   IR GENERIC HISTORICAL  12/17/2015   IR US GUIDE BX ASP/DRAIN 12/17/2015 [facility]   IR GENERIC HISTORICAL  12/17/2015   IR GUIDED DRAIN W CATHETER PLACEMENT 12/17/2015 [facility]   IR GENERIC HISTORICAL  12/17/2015   IR SINUS/FIST TUBE CHK-NON GI 12/17/2015 [facility]   KNEE SURGERY     LUNG SURGERY     TEE WITHOUT CARDIOVERSION N/A 12/19/2015   Procedure: TRANSESOPHAGEAL ECHOCARDIOGRAM (TEE);  Surgeon: Wendall Stade, MD;  Location: [facility] ENDOSCOPY;  Service: Cardiovascular;  Laterality: N/A;   Past Medical History:  Diagnosis Date   Anemia    Cervical neuritis    Chronic kidney disease    stage 3   Dyslipidemia    GERD (gastroesophageal reflux disease)    History of transient ischemic attack (TIA)    HTN (hypertension)    Hyperlipidemia    Migraine    Migraine headache    Occipital neuralgia    Left   Pulmonary embolism (HCC) 2017   Renal insufficiency    Stroke (HCC) 10/2013   BP (!) 148/91   Pulse (!) 103   Ht 5\' 6"  (1.676 m)   Wt 175 lb (79.4 kg)   BMI 28.25 kg/m   Opioid Risk Score:   Fall Risk Score:  `1  Depression screen [facility] 2/9     08/05/2022    8:26 AM 07/18/2022    3:31 PM 06/24/2022    2:36 PM 06/19/2022    8:37 AM 06/18/2022    8:40 AM 06/02/2022   12:15 PM 05/30/2022    2:16 PM  Depression screen PHQ 2/9  Decreased Interest 1 1 0 1 1 2 1   Down, Depressed, Hopeless 1 1 0 1 1 2 1   PHQ - 2 Score 2 2 0 2 2 4 2   Altered sleeping 0   1 1 2    Tired, decreased energy 1   1 1 1    Change in appetite 1   3 3 1    Feeling bad or failure about yourself  0   0 0 0   Trouble concentrating 1   1 1 1    Moving slowly or fidgety/restless 0   0 0  0   Suicidal thoughts 0   0 0 0   PHQ-9 Score 5   8 8 9    Difficult doing work/chores    Not difficult at all  Not difficult at all     Review of Systems  Musculoskeletal:  Positive for back pain and gait problem.       Pain from left waist down to left knee  All other  systems reviewed and are negative.     Objective:   Physical Exam   Sacral thrust (prone) : Positive left Lateral compression: Negative FABER's: Positive left Distraction (supine): Negative Thigh thrust test: Negative  Straight leg raise positive pulling sensation behind the calf area. Sensation intact to light touch bilateral lower extremities Deep tendon reflexes 2+ bilateral knees and ankles Motor strength is 5/5 bilateral hip flexor knee extensor ankle dorsiflexor Ambulates with a cane no evidence of toe drag or knee instability.      Assessment & Plan:   #1.  History of chronic low back pain she has been well-managed with sacroiliac radiofrequency neurotomies performed bilaterally.  Her right side is still effective approximately 9 months postprocedure The left-sided pain was not relieved after the left sacroiliac radiofrequency neurotomy performed in May 2024.  Reviewed her MRI of the lumbar spine which demonstrated severe spinal stenosis L4-5 as well as L3-L4 in addition she had severe foraminal stenosis at L5-S1. She does get relief of this pain with gabapentin.  We will increase gabapentin to 600 mg 3 times daily Continue tramadol 50 mg but may take twice daily. If above is not effective in managing her pain will go through another round of physical therapy when she returns from a trip.  If that fails may consider epidural injection

## 2022-09-18 NOTE — Patient Instructions (Signed)
Don't hesitate taking tramadol twice a day  Please increase Gabapentin to 3 times a day   If still having issues when you return , may consider a round of PT

## 2022-09-19 ENCOUNTER — Other Ambulatory Visit: Payer: Self-pay | Admitting: Student

## 2022-09-19 DIAGNOSIS — I10 Essential (primary) hypertension: Secondary | ICD-10-CM

## 2022-09-22 ENCOUNTER — Ambulatory Visit (HOSPITAL_BASED_OUTPATIENT_CLINIC_OR_DEPARTMENT_OTHER): Payer: Medicare Other | Admitting: Family

## 2022-09-22 ENCOUNTER — Other Ambulatory Visit: Payer: Self-pay | Admitting: Physical Medicine & Rehabilitation

## 2022-09-22 ENCOUNTER — Ambulatory Visit: Payer: Medicare Other | Attending: Family

## 2022-09-22 ENCOUNTER — Encounter (HOSPITAL_BASED_OUTPATIENT_CLINIC_OR_DEPARTMENT_OTHER): Payer: Self-pay | Admitting: Family

## 2022-09-22 ENCOUNTER — Telehealth: Payer: Self-pay | Admitting: *Deleted

## 2022-09-22 VITALS — BP 129/80 | HR 103 | Ht 66.0 in | Wt 180.9 lb

## 2022-09-22 DIAGNOSIS — I700 Atherosclerosis of aorta: Secondary | ICD-10-CM | POA: Diagnosis not present

## 2022-09-22 DIAGNOSIS — E785 Hyperlipidemia, unspecified: Secondary | ICD-10-CM | POA: Diagnosis not present

## 2022-09-22 DIAGNOSIS — R55 Syncope and collapse: Secondary | ICD-10-CM

## 2022-09-22 DIAGNOSIS — I25118 Atherosclerotic heart disease of native coronary artery with other forms of angina pectoris: Secondary | ICD-10-CM | POA: Diagnosis not present

## 2022-09-22 DIAGNOSIS — G4733 Obstructive sleep apnea (adult) (pediatric): Secondary | ICD-10-CM | POA: Diagnosis not present

## 2022-09-22 NOTE — Telephone Encounter (Signed)
Called patient to asked her to bring her cpap machine to visit on 09/23/22. Pt said she no longer uses cpap machine reports she took it back last year.

## 2022-09-22 NOTE — Progress Notes (Unsigned)
PATIENT: [name] DOB: 01/02/1951  REASON FOR VISIT: follow up HISTORY FROM: patient  No chief complaint on file.    HISTORY OF PRESENT ILLNESS:  09/23/2022 ALL: [name] returns for follow up for migraines, hx of OSA previously on CPAP and insomnia. She was last seen by Butch Penny 11/2021 and doing failry well on CPAP. She was not happy with mask. I saw her last 10/2021 and advised need to wean dose of doxepin as she was on higher doses and also taking trazodone.   10/14/2021 ALL: [name] returns for follow up for migraines and recently diagnosed OSA. She was last seen 06/2021 with worsening headaches as she had been off medications for 2 weeks. She resumed zonisamide 100mg  BID with extra 25mg  dose as needed. She also takes doxepin 200mg  at bedtime for insomnia. PCP also writes 25-50mg  trazodone for insomnia but she has not started this medication. Migraines are somewhat improved. She reports having 2 migraines a week. Nurtec does help with abortive therapy.   HST showed moderate OSA with total AHI of 27.6/hr and O2 nadir of 86%. AutoPAP ordered. She reports that she has been in contact with [facility] but not yet received her machine.    She is requesting to be excused from jury duty due tot he severity of her migraines. Juror #[id number]  06/27/2021 ALL: [name] returns for follow up for migraines. She continues zonisamide 100mg  BID with extra 25mg  dose as needed. She also takes doxepin 200mg  at bedtime for insomnia. PCP also writes 25-50mg  trazodone for insomnia. She was seen in consult with Dr Frances Furbish 05/2021 and advised against taking two sleep aids. HST ordered. She was encouraged to decrease caffeine intake.   She has been off zonisamide for 2 weeks. She reports headaches are significantly worse. She has had a bad headache for the past few days. She was able to pick up all meds yesterday and restarted last night. Nurtec She reports that she does not drink caffeine. She endorses significant  stress. She is caring for her brother and husband who are both ill. She is not sleeping well despite taking doxepin 200mg  and trazodone 25-50mg  (from PCP). She wakes multiple times at night. She has not scheduled sleep study due to being limited on time. She reports BP has been elevated this week. She feels that it is elevated due to headaches from being off medications.   02/26/2021 ALL:  [name] returns with concerns of worsening headaches. She continues zonisamide 100mg  BID with extra 25mg  dose daily as needed. She reports worsening over the past 1-2 months. Headaches are more frequent (near daily) and more intense. Similar symptoms has previous headaches. No new symptoms. Most are present in the morning. Can worsen during the day. She has had an intractable migraine for the past 3 days. Tizanidine and Tylenol usually abort migraines but she has not gotten much relief. She continues doxepin 200mg  QHS for insomnia managed with our office. Trazodone 25-50 prescribed with PCP. She has interrupted sleep. She does snore. She wakes multiple times at night. She does not wake refreshed. She does have dry mouth. Brother has sleep apnea on CPAP.   Medications tried and failed: Ajovy (ineffective), topiramate, zonisamide (on now), trazodone (on now), sertraline (on now), gabapentin (on now), propranolol, Nurtec, tramadol (takes at night), tizanidine, ondansetron, cyclobenzaprine, metoclopramide, triptans contraindicated due to PVD, CVA and TIA history   10/10/2020 ALL: [name] returns for follow up for migraines and insomnia. She is doing well. She continues zonisamide 100mg   BID and doxepin 200mg  at bedtime. She has had several stressors since we last saw her. Her brother had to have emergency surgery so she has had to help care for him and his hair salon.  She is seeing Dr Shon Baton, [facility], and Dr Wynn Banker, sports med, for chronic neck and back pain. Dr Wynn Banker has her on gabapentin 600mg  twice daily that helps  some. Dr Aundria Rud for broken shoulder and Dr Jannette Spanner for chronic knee pain.   Headaches are occurring most days. She feels that she has pain starting in her neck that radiates to the top of her head everyday around 3pm. She has not tried taking extra 25mg  dose of imipramine. She is sleeping well. She is tolerating doxepin.   10/10/2019 ALL:  [name] is a 72 y.o. female here today for follow up for headaches. She continues zonisamide 100mg  daily. She has a 25mg  dose that she rarely takes. She has daily headaches, mostly in the mornings, that are usually easily treatable. She does note that weather changes are a trigger for her. She is able to abort headaches with rest. She may have 1-2 migraines per month.   She continues doxepin 200mg  at night for insomnia. She has been on this medication for years. She reports that she has a difficult time sleeping. She struggles to go to sleep at night and to stay asleep. She does wake frequently at night to use the restroom. She wakes with a dry mouth. She is not sure if she snores. She has a brother who uses CPAP therapy.   HISTORY: (copied from my note on 12/09/2018)  [name] is a 72 y.o. female here today for follow up for migraines. She started Ajovy about a year ago. She felt that it helped initially but after a few months it did not seem to help. She has daily dull headaches, usually behind the right eye. About 1-2 times a month she has more severe headache with light, sound sensitivity and nausea. She continues zonisamide 100mg  and tizanidine 2mg  daily. She takes gabapentin 600mg  1-2 times daily for headaches and dysesthesias in her left side. Doxepin helps with sleep. She is followed closely by PCP for BP and cholesterol management. She is intolerant to statins, now on Repatha. On Plavix.    HISTORY: (copied from [place] note on 03/06/2018)   [name] is a 72 year old female with a history of intractable migraine headaches.  She  returns today for follow-up.  Overall she feels that she has done relatively well.  He continues to have a daily dull headache that is in the right temporal region.  However she states that she has not had a severe headache in the last 6 months.  She reports she does have photophobia with her headaches.  She states that if she misses her medication then her headache will worsen.  She reports that she was unable to obtain Aimovig.  She continues on Zonegran and tizanidine.   HISTORY 07/01/17 [name] is a 72 year old female with a history of intractable migraine headaches.  She returns today for follow-up.  She reports that she continues to have a daily dull headache.  She remains on Zonegran and tizanidine.  She states that she did receive a letter about Aimovig but was unable to follow-up on this due to her brother being in the hospital.  She states that she only has approximately one severe migraine a month.  Her headaches seem to occur on the right side  starting at the neck radiating to the eyes.  She does have photophobia, phonophobia and nausea.  She continues to take doxepin at night.  She returns today for an evaluation.    REVIEW OF SYSTEMS: Out of a complete 14 system review of symptoms, the patient complains only of the following symptoms, morning headaches, dry mouth, fatigue, chronic pain, insomnia and all other reviewed systems are negative.  ALLERGIES: Allergies  Allergen Reactions   Iodinated Contrast Media Shortness Of Breath and Itching    States she had this reaction connected with a MRI of the cervical spine.   Shellfish Allergy Anaphylaxis    All seafood   Crestor [Rosuvastatin] Other (See Comments)    Anxiety, itching, headaches, myalgias Able to tolerate 5mg  daily   Lipitor [Atorvastatin] Other (See Comments)    Anxiety, itching, headaches, myalgias   Statins Other (See Comments)    Arthralgias (severe) with atorvastatin, mild arthralgias with rosuvastatin but willing to  continue taking it   Iodine     HOME MEDICATIONS: Outpatient Medications Prior to Visit  Medication Sig Dispense Refill   albuterol (VENTOLIN HFA) 108 (90 Base) MCG/ACT inhaler Inhale 2 puffs into the lungs every 6 (six) hours as needed for wheezing or shortness of breath. 8 g 6   amLODipine (NORVASC) 5 MG tablet TAKE 1 TABLET (5 MG TOTAL) BY MOUTH DAILY. 90 tablet 3   cetirizine (ZYRTEC) 10 MG tablet Take 1 tablet (10 mg total) by mouth daily with breakfast. 30 tablet 0   clopidogrel (PLAVIX) 75 MG tablet TAKE 1 TABLET BY MOUTH EVERY DAY 90 tablet 3   doxepin (SINEQUAN) 100 MG capsule Take 2 capsules (200 mg total) by mouth at bedtime. 180 capsule 0   ezetimibe (ZETIA) 10 MG tablet TAKE 1 TABLET BY MOUTH EVERY DAY 90 tablet 3   fluticasone (FLONASE) 50 MCG/ACT nasal spray Place 1 spray into both nostrils daily. 15.8 mL 2   furosemide (LASIX) 40 MG tablet Take 1 tablet (40 mg total) by mouth daily. 30 tablet 2   gabapentin (NEURONTIN) 600 MG tablet Take 1 tablet (600 mg total) by mouth 3 (three) times daily. 90 tablet 3   inclisiran (LEQVIO) 284 MG/1.5ML SOSY injection Inject as directed 1.5 mL 0   losartan (COZAAR) 25 MG tablet TAKE 1 TABLET BY MOUTH EVERYDAY AT BEDTIME 90 tablet 0   ondansetron (ZOFRAN) 4 MG tablet Take 1 tablet (4 mg total) by mouth every 8 (eight) hours as needed for nausea or vomiting. 20 tablet 0   pantoprazole (PROTONIX) 40 MG tablet TAKE 1 TABLET(40 MG) BY MOUTH TWICE DAILY 180 tablet 1   Rimegepant Sulfate (NURTEC) 75 MG TBDP Take 75 mg by mouth daily as needed (take for abortive therapy of migraine, no more than 1 tablet in 24 hours or 10 per month). 8 tablet 11   rosuvastatin (CRESTOR) 5 MG tablet TAKE 1 TABLET (5 MG TOTAL) BY MOUTH DAILY. 90 tablet 3   sertraline (ZOLOFT) 100 MG tablet Take 1 tablet (100 mg total) by mouth daily. 90 tablet 3   tiZANidine (ZANAFLEX) 4 MG tablet Take 1 tablet (4 mg total) by mouth every 6 (six) hours as needed for muscle spasms. 180  tablet 1   traMADol (ULTRAM) 50 MG tablet TAKE 1 TABLET EVERY 8 HOURS AS NEEDED FOR MODERATE PAIN 90 tablet 0   traZODone (DESYREL) 50 MG tablet TAKE 1/2 TO 1 TABLET BY MOUTH AT BEDTIME AS NEEDED FOR SLEEP 90 tablet 0   zonisamide (ZONEGRAN)  100 MG capsule Take 1 capsule (100 mg total) by mouth 2 (two) times daily. 180 capsule 0   zonisamide (ZONEGRAN) 25 MG capsule take 25 mg twice daily prn in addition to 100 mg twice daily. 180 capsule 0   No facility-administered medications prior to visit.    PAST MEDICAL HISTORY: Past Medical History:  Diagnosis Date   Anemia    Cervical neuritis    Chronic kidney disease    stage 3   Dyslipidemia    GERD (gastroesophageal reflux disease)    History of transient ischemic attack (TIA)    HTN (hypertension)    Hyperlipidemia    Migraine    Migraine headache    Occipital neuralgia    Left   Pulmonary embolism (HCC) 2017   Renal insufficiency    Stroke (HCC) 10/2013    PAST SURGICAL HISTORY: Past Surgical History:  Procedure Laterality Date   ABDOMINAL HYSTERECTOMY     ANTERIOR CERVICAL DECOMP/DISCECTOMY FUSION N/A 05/24/2020   Procedure: ANTERIOR CERVICAL DECOMPRESSION/DISCECTOMY FUSION 1 LEVEL C3-4;  Surgeon: Venita Lick, MD;  Location: [facility] OR;  Service: Orthopedics;  Laterality: N/A;  3 hrs   BILIARY STENT PLACEMENT N/A 12/12/2015   Procedure: BILIARY STENT PLACEMENT;  Surgeon: Sherrilyn Rist, MD;  Location: [facility] ENDOSCOPY;  Service: Endoscopy;  Laterality: N/A;   BLADDER REPAIR     CARPAL TUNNEL RELEASE     CERVICAL FUSION     CERVICAL SPINE SURGERY     CHOLECYSTECTOMY N/A 12/06/2015   Procedure: LAPAROSCOPIC CHOLECYSTECTOMY WITH  INTRAOPERATIVE CHOLANGIOGRAM;  Surgeon: Almond Lint, MD;  Location: [facility] OR;  Service: General;  Laterality: N/A;   ELBOW SURGERY     ERCP N/A 12/12/2015   Procedure: ENDOSCOPIC RETROGRADE CHOLANGIOPANCREATOGRAPHY (ERCP);  Surgeon: Sherrilyn Rist, MD;  Location: [facility] ENDOSCOPY;  Service: Endoscopy;   Laterality: N/A;   ESOPHAGOGASTRODUODENOSCOPY N/A 01/28/2016   Procedure: ESOPHAGOGASTRODUODENOSCOPY (EGD) Biliary STENT removal;  Surgeon: Sherrilyn Rist, MD;  Location: [facility] ENDOSCOPY;  Service: Gastroenterology;  Laterality: N/A;   IR GENERIC HISTORICAL  12/17/2015   IR US GUIDE BX ASP/DRAIN 12/17/2015 [facility]   IR GENERIC HISTORICAL  12/17/2015   IR GUIDED DRAIN W CATHETER PLACEMENT 12/17/2015 [facility]   IR GENERIC HISTORICAL  12/17/2015   IR SINUS/FIST TUBE CHK-NON GI 12/17/2015 [facility]   KNEE SURGERY     LUNG SURGERY     TEE WITHOUT CARDIOVERSION N/A 12/19/2015   Procedure: TRANSESOPHAGEAL ECHOCARDIOGRAM (TEE);  Surgeon: Wendall Stade, MD;  Location: [facility] ENDOSCOPY;  Service: Cardiovascular;  Laterality: N/A;    FAMILY HISTORY: Family History  Problem Relation Age of Onset   Cancer Mother 69       breast   Alzheimer's disease Mother    Prostate cancer Father    Lung cancer Father    Cancer Brother    Hyperlipidemia Brother    Sleep apnea Brother    Cancer Brother    Hyperlipidemia Brother    Dementia Brother        frontotemporal dementia   Dementia Maternal Grandmother    Cancer Maternal Grandfather     SOCIAL HISTORY: Social History   Socioeconomic History   Marital status: Married    Spouse name: [name]   Number of children: 1   Years of education: 12+   Highest education level: High school graduate  Occupational History   Occupation: ACTIVITY DIRECTOR    Employer: [employer]   Occupation: CNA   Occupation: Retired  Tobacco Use   Smoking status: Former    Current packs/day: 0.00    Average packs/day: 1.5 packs/day for 42.6 years (64.0 ttl pk-yrs)    Types: Cigarettes    Start date: 03/10/1969    Quit date: 11/01/2011    Years since quitting: 10.8   Smokeless tobacco: Never   Tobacco comments:    Smoked 1.5-2 ppd for adult life ([place] menthols)  Vaping Use   Vaping status: Never Used  Substance and Sexual Activity   Alcohol use: No     Alcohol/week: 0.0 standard drinks of alcohol   Drug use: No   Sexual activity: Yes    Partners: Male    Birth control/protection: Surgical    Comment: 1 sexual partner in last 12 months: married  Other Topics Concern   Not on file  Social History Narrative    Patient is married ([name]).   Patient has one child and grandchild. Her sons name is [name]. Patient sees them often.   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 Patient spends 6-10 hours with her 6 days a week.    Patient has a college education.   Patient is very active in her church.    Hobbies- outside crafts and church choir.   Social Determinants of Health   Financial Resource Strain: Low Risk  (06/19/2022)   Overall Financial Resource Strain (CARDIA)    Difficulty of Paying Living Expenses: Not hard at all  Food Insecurity: No Food Insecurity (06/19/2022)   Hunger Vital Sign    Worried About Running Out of Food in the Last Year: Never true    Ran Out of Food in the Last Year: Never true  Transportation Needs: No Transportation Needs (06/19/2022)   PRAPARE - Administrator, Civil Service (Medical): No    Lack of Transportation (Non-Medical): No  Physical Activity: Sufficiently Active (06/19/2022)   Exercise Vital Sign    Days of Exercise per Week: 5 days    Minutes of Exercise per Session: 30 min  Stress: Stress Concern Present (06/19/2022)   [employer] of Occupational Health - Occupational Stress Questionnaire    Feeling of Stress : To some extent  Social Connections: Moderately Integrated (06/19/2022)   Social Connection and Isolation Panel [NHANES]    Frequency of Communication with Friends and Family: More than three times a week    Frequency of Social Gatherings with Friends and Family: Twice a week    Attends Religious Services: More than 4 times per year    Active Member of [employer] or Organizations: Yes    Attends Banker Meetings: Never     Marital Status: Widowed  Intimate Partner Violence: Not At Risk (06/19/2022)   Humiliation, Afraid, Rape, and Kick questionnaire    Fear of Current or Ex-Partner: No    Emotionally Abused: No    Physically Abused: No    Sexually Abused: No      PHYSICAL EXAM  There were no vitals filed for this visit.      There is no height or weight on file to calculate BMI.  Generalized: Well developed, in no acute distress  Cardiology: normal rate and rhythm, no murmur noted Respiratory: clear to auscultation bilaterally  Neurological examination  Mentation: Alert oriented to time, place, history taking. Follows all commands speech and language fluent Cranial nerve II-XII: Pupils were equal round reactive to light. Extraocular movements were full, visual field were full on  confrontational test. Facial sensation and strength were normal. Head turning and shoulder shrug  were normal and symmetric. Motor: The motor testing reveals 5 over 5 strength of all 4 extremities. Good symmetric motor tone is noted throughout.  Sensory: Sensory testing is intact to soft touch on all 4 extremities. No evidence of extinction is noted.   Gait and station: Gait is stable without assistive device    DIAGNOSTIC DATA (LABS, IMAGING, TESTING) - I reviewed patient records, labs, notes, testing and imaging myself where available.      No data to display           Lab Results  Component Value Date   WBC 7.6 08/05/2022   HGB 12.9 08/05/2022   HCT 41.6 08/05/2022   MCV 86 08/05/2022   PLT 270 08/05/2022      Component Value Date/Time   NA 143 08/06/2022 0812   K 4.0 08/06/2022 0812   CL 102 08/06/2022 0812   CO2 24 08/06/2022 0812   GLUCOSE 126 (H) 08/06/2022 0812   GLUCOSE 135 (H) 08/29/2019 1030   BUN 13 08/06/2022 0812   CREATININE 1.19 (H) 08/06/2022 0812   CREATININE 1.10 (H) 02/25/2016 0907   CALCIUM 9.4 08/06/2022 0812   PROT 7.0 08/06/2022 0812   ALBUMIN 4.7 08/06/2022 0812   AST 28  08/06/2022 0812   ALT 24 08/06/2022 0812   ALKPHOS 162 (H) 08/06/2022 0812   BILITOT 0.2 08/06/2022 0812   GFRNONAA >60 08/29/2019 1030   GFRNONAA 53 (L) 02/25/2016 0907   GFRAA >60 08/29/2019 1030   GFRAA 61 02/25/2016 0907   Lab Results  Component Value Date   CHOL 106 08/06/2022   HDL 82 08/06/2022   LDLCALC 10 08/06/2022   LDLDIRECT 156 (H) 03/27/2017   TRIG 66 08/06/2022   CHOLHDL 1.3 08/06/2022   Lab Results  Component Value Date   HGBA1C 5.5 07/30/2017   Lab Results  Component Value Date   VITAMINB12 290 07/21/2017   Lab Results  Component Value Date   TSH 1.610 11/03/2018     ASSESSMENT AND PLAN 72 y.o. year old female  has a past medical history of Anemia, Cervical neuritis, Chronic kidney disease, Dyslipidemia, GERD (gastroesophageal reflux disease), History of transient ischemic attack (TIA), HTN (hypertension), Hyperlipidemia, Migraine, Migraine headache, Occipital neuralgia, Pulmonary embolism (HCC) (2017), Renal insufficiency, and Stroke (HCC) (10/2013). here with   No diagnosis found.    Nyriah reports headaches have improved since restarting medication. We will continue zonisamide 100mg  twice daily with extra 25mg  dose twice daily as needed. She will continue Nurtec daily as needed for abortive therapy. Insomnia is better on doxepin 200mg  at bedtime. We have discussed need to wean dosing. She has taken 200mg  daily for many years. Once migraines are improved, we will start weaning dose. She was advised against taking trazodone with doxepin. She was encouraged to reach out to [facility] to get set up with autoPAP. She will continue working on healthy lifestyle habits. She will follow up with me in September for initial autoPAP compliance review.    No orders of the defined types were placed in this encounter.     No orders of the defined types were placed in this encounter.       Shawnie Dapper, FNP-C 09/22/2022, 4:43 PM [facility] [address] [place], [place] [address] [phone]

## 2022-09-22 NOTE — Progress Notes (Signed)
Cardiology Office Note:  .   Date:  09/22/2022  ID:  [name], DOB Feb 03, 1951, MRN [id number] PCP: Erick Alley, DO  [facility] Providers Cardiologist:  Chilton Si, MD    History of Present Illness: [name] is a 72 y.o. female with a hx of HTN, coronary artery calcification by CT, HLD, CKD III, CVA, left subclavian artery stenosis, prior tobacco use last seen 08/05/22.  She was admitted May 2019 with shortness of breath.  Lower extremity Doppler negative for DVT.  VQ scan showed perfusion defect deemed intermittent probability.  She was treated with Eliquis for 6 months.  Myoview July 2019 EF 66%, no ischemia.  Repeat Myoview October 2021 EF 66%, apical anterior and apical defect due to breast attenuation.  Referred to pulmonology and felt to be related to deconditioning and reactive airway disease, treated with Breo and albuterol.  When seen 03/16/2020 by Dr. Duke Salvia amlodipine was increased to 5 mg due to hypertension.  When seen 05/22/2020 she was provided clearance for anterior cervical vasectomy and fusion of C3-4   At clinic visit 06/28/2021 she noted difficulty with statins and was started on Incliseron. Sleep study 08/2021 with moderate OSA recommended for CPAP.    Last seen 08/05/22 with 3 episodes of syncope without prodromal symptoms.  She was not orthostatic.  30-day heart monitor, CT neck, chest as well as echocardiogram were recommended. Echo 09/04/22 LVEF 60 to 65%, mild LVH, grade 1 diastolic dysfunction, mild to moderate aortic regurgitation.  CT chest 08/11/22 no PE, known compression fracture of thoracic spine, coronary calcification, aortic atherosclerosis. 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   She presents today for follow up independently. Excited for upcoming trip to [place]. Has been doing well with no recurrent syncope. 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 She attributes prior syncope episodes to migraines. Notes her neck is more bothersome with the current weather and humidity. She does follow with neurology regarding migraines. BP at home has been 120-135/80. Notes monitor when mailed to her was missing parts and she did not wear.    ROS: Please see the history of present illness.    All other systems reviewed and are negative.   Studies Reviewed: .        CT angio chest 08/11/22 1. Negative for acute PE or thoracic aortic dissection, with limitations as above. 2. Mild T7 vertebral compression deformity, new since 01/17/2022, without significant retropulsion. 3. Coronary and aortic Atherosclerosis (ICD10-I70.0).  CT angio neck 08/11/22 IMPRESSION: 1. 50% atheromatous narrowing at the origin of the left vertebral artery. 2. Atherosclerosis without ulceration or significant stenosis in the bilateral carotid arteries.  Echo 09/04/22 1. Left ventricular ejection fraction, by estimation, is 60 to 65%. The  left ventricle has normal function. The left ventricle has no regional  wall motion abnormalities. There is mild concentric left ventricular  hypertrophy. Left ventricular diastolic  parameters are consistent with Grade I diastolic dysfunction (impaired  relaxation).   2. Right ventricular systolic function is normal. The right ventricular  size is normal.   3. The mitral valve is normal in structure. Mild mitral valve  regurgitation. No evidence of mitral stenosis.   4. The aortic valve is normal in structure. Aortic valve regurgitation is  mild to moderate. Aortic valve sclerosis/calcification is present, without  any evidence of aortic  stenosis.   5. The inferior vena cava is normal in size with greater than 50%  respiratory variability, suggesting right atrial pressure of 3 mmHg.   Risk Assessment/Calculations:             Physical Exam:   VS:  BP  129/80 (BP Location: Left Arm, Patient Position: Sitting, Cuff Size: Large)   Pulse (!) 103   Ht 5\' 6"  (1.676 m)   Wt 180 lb 14.4 oz (82.1 kg)   SpO2 93%   BMI 29.20 kg/m    Wt Readings from Last 3 Encounters:  09/22/22 180 lb 14.4 oz (82.1 kg)  09/18/22 175 lb (79.4 kg)  09/08/22 174 lb (78.9 kg)    GEN: Well nourished, overweight, well developed in no acute distress NECK: No JVD; No carotid bruits CARDIAC: RRR, no murmurs, rubs, gallops RESPIRATORY:  Clear to auscultation without rales, wheezing or rhonchi  ABDOMEN: Soft, non-tender, non-distended EXTREMITIES:  No edema; No deformity   ASSESSMENT AND PLAN: .    Syncope - No recurrence. 08/2022 CT angio chest, neck and echo with no clear cause as detailed above. Previously did not wear monitor as it was missing pieces. Given her prior episodes were without prodromal symptom would recommend monitor to r/o heart block or pause. Plan for 14 day ZIO to be mailed to her home. She will wear after her upcoming trip to [place].   HTN - BP well controlled. Continue current antihypertensive regimen of Amlodipine 5mg  QD, Lasix 40mg  QD. Discussed to monitor BP at home at least 2 hours after medications and sitting for 5-10 minutes.    Coronary artery calcification / HLD - Stable with no anginal symptoms. No indication for ischemic evaluation.  GDMT includes Plavix, Zetia, Crestor, Incliseron. Previously intolerant to higher doses Crestor, Praluent, atorvastatin. Has been started on Incliseron by primary care.   CKD III - Careful titration of diuretic and antihypertensive.   Obesity - Weight loss via diet and exercise encouraged. Discussed the impact being overweight would have on cardiovascular risk. Recommend aiming for 150 minutes of moderate intensity activity per week and following a heart healthy diet.      OSA -CPAP compliance encouraged.     Dispo: follow up in 2-3 months  Signed, Alver Sorrow, NP

## 2022-09-22 NOTE — Patient Instructions (Signed)
Medication Instructions:  Your physician recommends that you continue on your current medications as directed. Please refer to the Current Medication list given to you today.  *If you need a refill on your cardiac medications before your next appointment, please call your pharmacy*   Testing/Procedures: Your physician has recommended that you wear a Zio monitor.   This monitor is a medical device that records the heart's electrical activity. Doctors most often use these monitors to diagnose arrhythmias. Arrhythmias are problems with the speed or rhythm of the heartbeat. The monitor is a small device applied to your chest. You can wear one while you do your normal daily activities. While wearing this monitor if you have any symptoms to push the button and record what you felt. Once you have worn this monitor for the period of time provider prescribed (Usually 14 days), you will return the monitor device in the postage paid box. Once it is returned they will download the data collected and provide [place] with a report which the provider will then review and we will call you with those results. Important tips:  Avoid showering during the first 24 hours of wearing the monitor. Avoid excessive sweating to help maximize wear time. Do not submerge the device, no hot tubs, and no swimming pools. Keep any lotions or oils away from the patch. After 24 hours you may shower with the patch on. Take brief showers with your back facing the shower head.  Do not remove patch once it has been placed because that will interrupt data and decrease adhesive wear time. Push the button when you have any symptoms and write down what you were feeling. Once you have completed wearing your monitor, remove and place into box which has postage paid and place in your outgoing mailbox.  If for some reason you have misplaced your box then call our office and we can provide another box and/or mail it off for you.   Follow-Up: At  [facility], you and your health needs are our priority.  As part of our continuing mission to provide you with exceptional heart care, we have created designated Provider Care Teams.  These Care Teams include your primary Cardiologist (physician) and Advanced Practice Providers (APPs -  Physician Assistants and Nurse Practitioners) who all work together to provide you with the care you need, when you need it.  We recommend signing up for the patient portal called "MyChart".  Sign up information is provided on this After Visit Summary.  MyChart is used to connect with patients for Virtual Visits (Telemedicine).  Patients are able to view lab/test results, encounter notes, upcoming appointments, etc.  Non-urgent messages can be sent to your provider as well.   To learn more about what you can do with MyChart, go to [email or web address].    Your next appointment:   2-3 months with Dr. Duke Salvia or Gillian Shields, NP

## 2022-09-22 NOTE — Progress Notes (Unsigned)
Enrolled for [employer] to mail a ZIO XT long term holter monitor to the patients address on file.  Request mail date of 10/09/22.  Dr. Duke Salvia to read.

## 2022-09-22 NOTE — Patient Instructions (Signed)
Below is our plan:  We will continue to wean doxepin over the next year. Consider Botox therapy for migraines if they worsen. Consider talking to your dentist about an oral appliance. Let me know if you change your mind about CPAP.   Please make sure you are staying well hydrated. I recommend 50-60 ounces daily. Well balanced diet and regular exercise encouraged. Consistent sleep schedule with 6-8 hours recommended.   Please continue follow up with care team as directed.   Follow up with me in 6-12 months   You may receive a survey regarding today's visit. I encourage you to leave honest feed back as I do use this information to improve patient care. Thank you for seeing me today!   GENERAL HEADACHE INFORMATION:   Natural supplements: Magnesium Oxide or Magnesium Glycinate 500 mg at bed (up to 800 mg daily) Coenzyme Q10 300 mg in AM Vitamin B2- 200 mg twice a day   Add 1 supplement at a time since even natural supplements can have undesirable side effects. You can sometimes buy supplements cheaper (especially Coenzyme Q10) at www.WebmailGuide.co.za or at [facility].  Migraine with aura: There is increased risk for stroke in women with migraine with aura and a contraindication for the combined contraceptive pill for use by women who have migraine with aura. The risk for women with migraine without aura is lower. However other risk factors like smoking are far more likely to increase stroke risk than migraine. There is a recommendation for no smoking and for the use of OCPs without estrogen such as progestogen only pills particularly for women with migraine with aura.Marland Kitchen People who have migraine headaches with auras may be 3 times more likely to have a stroke caused by a blood clot, compared to migraine patients who don't see auras. Women who take hormone-replacement therapy may be 30 percent more likely to suffer a clot-based stroke than women not taking medication containing estrogen. Other risk factors like  smoking and high blood pressure may be  much more important.    Vitamins and herbs that show potential:   Magnesium: Magnesium (250 mg twice a day or 500 mg at bed) has a relaxant effect on smooth muscles such as blood vessels. Individuals suffering from frequent or daily headache usually have low magnesium levels which can be increase with daily supplementation of 400-750 mg. Three trials found 40-90% average headache reduction  when used as a preventative. Magnesium may help with headaches are aura, the best evidence for magnesium is for migraine with aura is its thought to stop the cortical spreading depression we believe is the pathophysiology of migraine aura.Magnesium also demonstrated the benefit in menstrually related migraine.  Magnesium is part of the messenger system in the serotonin cascade and it is a good muscle relaxant.  It is also useful for constipation which can be a side effect of other medications used to treat migraine. Good sources include nuts, whole grains, and tomatoes. Side Effects: loose stool/diarrhea  Riboflavin (vitamin B 2) 200 mg twice a day. This vitamin assists nerve cells in the production of ATP a principal energy storing molecule.  It is necessary for many chemical reactions in the body.  There have been at least 3 clinical trials of riboflavin using 400 mg per day all of which suggested that migraine frequency can be decreased.  All 3 trials showed significant improvement in over half of migraine sufferers.  The supplement is found in bread, cereal, milk, meat, and poultry.  Most Americans get  more riboflavin than the recommended daily allowance, however riboflavin deficiency is not necessary for the supplements to help prevent headache. Side effects: energizing, green urine   Coenzyme Q10: This is present in almost all cells in the body and is critical component for the conversion of energy.  Recent studies have shown that a nutritional supplement of CoQ10 can reduce  the frequency of migraine attacks by improving the energy production of cells as with riboflavin.  Doses of 150 mg twice a day have been shown to be effective.   Melatonin: Increasing evidence shows correlation between melatonin secretion and headache conditions.  Melatonin supplementation has decreased headache intensity and duration.  It is widely used as a sleep aid.  Sleep is natures way of dealing with migraine.  A dose of 3 mg is recommended to start for headaches including cluster headache. Higher doses up to 15 mg has been reviewed for use in Cluster headache and have been used. The rationale behind using melatonin for cluster is that many theories regarding the cause of Cluster headache center around the disruption of the normal circadian rhythm in the brain.  This helps restore the normal circadian rhythm.   HEADACHE DIET: Foods and beverages which may trigger migraine Note that only 20% of headache patients are food sensitive. You will know if you are food sensitive if you get a headache consistently 20 minutes to 2 hours after eating a certain food. Only cut out a food if it causes headaches, otherwise you might remove foods you enjoy! What matters most for diet is to eat a well balanced healthy diet full of vegetables and low fat protein, and to not miss meals.   Chocolate, other sweets ALL cheeses except cottage and cream cheese Dairy products, yogurt, sour cream, ice cream Liver Meat extracts (Bovril, Marmite, meat tenderizers) Meats or fish which have undergone aging, fermenting, pickling or smoking. These include: Hotdogs,salami,Lox,sausage, mortadellas,smoked salmon, pepperoni, Pickled herring Pods of broad bean (English beans, Chinese pea pods, [place] (fava) beans, lima and navy beans Ripe avocado, ripe banana Yeast extracts or active yeast preparations such as Brewer's or Fleishman's (commercial bakes goods are permitted) Tomato based foods, pizza (lasagna, etc.)   MSG  (monosodium glutamate) is disguised as many things; look for these common aliases: Monopotassium glutamate Autolysed yeast Hydrolysed protein Sodium caseinate "flavorings" "all natural preservatives" Nutrasweet   Avoid all other foods that convincingly provoke headaches.   Resources: The Dizzy [place] Laundry Your Headache Diet, migrainestrong.com  https://zamora-andrews.com/   Caffeine and Migraine For patients that have migraine, caffeine intake more than 3 days per week can lead to dependency and increased migraine frequency. I would recommend cutting back on your caffeine intake as best you can. The recommended amount of caffeine is 200-300 mg daily, although migraine patients may experience dependency at even lower doses. While you may notice an increase in headache temporarily, cutting back will be helpful for headaches in the long run. For more information on caffeine and migraine, visit: https://americanmigrainefoundation.org/resource-library/caffeine-and-migraine/   Headache Prevention Strategies:   1. Maintain a headache diary; learn to identify and avoid triggers.  - This can be a simple note where you log when you had a headache, associated symptoms, and medications used - There are several smartphone apps developed to help track migraines: Migraine Buddy, Migraine Monitor, Curelator N1-Headache App   Common triggers include: Emotional triggers: Emotional/Upset family or friends Emotional/Upset occupation Business reversal/success Anticipation anxiety Crisis-serious Post-crisis periodNew job/position   Physical triggers: Vacation Day Weekend Strenuous Exercise  High Altitude Location New Move Menstrual Day Physical Illness Oversleep/Not enough sleep Weather changes Light: Photophobia or light sesnitivity treatment involves a balance between desensitization and reduction in overly strong input. Use dark polarized glasses  outside, but not inside. Avoid bright or fluorescent light, but do not dim environment to the point that going into a normally lit room hurts. Consider FL-41 tint lenses, which reduce the most irritating wavelengths without blocking too much light.  These can be obtained at axonoptics.com or theraspecs.com Foods: see list above.   2. Limit use of acute treatments (over-the-counter medications, triptans, etc.) to no more than 2 days per week or 10 days per month to prevent medication overuse headache (rebound headache).     3. Follow a regular schedule (including weekends and holidays): Don't skip meals. Eat a balanced diet. 8 hours of sleep nightly. Minimize stress. Exercise 30 minutes per day. Being overweight is associated with a 5 times increased risk of chronic migraine. Keep well hydrated and drink 6-8 glasses of water per day.   4. Initiate non-pharmacologic measures at the earliest onset of your headache. Rest and quiet environment. Relax and reduce stress. Breathe2Relax is a free app that can instruct you on    some simple relaxtion and breathing techniques. Http://Dawnbuse.com is a    free website that provides teaching videos on relaxation.  Also, there are  many apps that   can be downloaded for "mindful" relaxation.  An app called YOGA NIDRA will help walk you through mindfulness. Another app called Calm can be downloaded to give you a structured mindfulness guide with daily reminders and skill development. Headspace for guided meditation Mindfulness Based Stress Reduction Online Course: www.palousemindfulness.com Cold compresses.   5. Don't wait!! Take the maximum allowable dosage of prescribed medication at the first sign of migraine.   6. Compliance:  Take prescribed medication regularly as directed and at the first sign of a migraine.   7. Communicate:  Call your physician when problems arise, especially if your headaches change, increase in frequency/severity, or become  associated with neurological symptoms (weakness, numbness, slurred speech, etc.). Proceed to emergency room if you experience new or worsening symptoms or symptoms do not resolve, if you have new neurologic symptoms or if headache is severe, or for any concerning symptom.   8. Headache/pain management therapies: Consider various complementary methods, including medication, behavioral therapy, psychological counselling, biofeedback, massage therapy, acupuncture, dry needling, and other modalities.  Such measures may reduce the need for medications. Counseling for pain management, where patients learn to function and ignore/minimize their pain, seems to work very well.   9. Recommend changing family's attention and focus away from patient's headaches. Instead, emphasize daily activities. If first question of day is 'How are your headaches/Do you have a headache today?', then patient will constantly think about headaches, thus making them worse. Goal is to re-direct attention away from headaches, toward daily activities and other distractions.   10. Helpful Websites: www.AmericanHeadacheSociety.org PatentHood.ch www.headaches.org TightMarket.nl www.achenet.org

## 2022-09-23 ENCOUNTER — Encounter: Payer: Self-pay | Admitting: Family Medicine

## 2022-09-23 ENCOUNTER — Ambulatory Visit (INDEPENDENT_AMBULATORY_CARE_PROVIDER_SITE_OTHER): Payer: Medicare Other | Admitting: Family Medicine

## 2022-09-23 VITALS — BP 120/77 | HR 98 | Ht 65.0 in | Wt 181.2 lb

## 2022-09-23 DIAGNOSIS — G43009 Migraine without aura, not intractable, without status migrainosus: Secondary | ICD-10-CM

## 2022-09-23 DIAGNOSIS — R519 Headache, unspecified: Secondary | ICD-10-CM

## 2022-09-23 DIAGNOSIS — G4733 Obstructive sleep apnea (adult) (pediatric): Secondary | ICD-10-CM | POA: Diagnosis not present

## 2022-09-23 MED ORDER — ZONISAMIDE 25 MG PO CAPS: ORAL_CAPSULE | ORAL | 0 refills | Status: DC

## 2022-09-23 MED ORDER — DOXEPIN HCL 100 MG PO CAPS: 100.0000 mg | ORAL_CAPSULE | Freq: Every day | ORAL | 1 refills | Status: DC

## 2022-09-23 MED ORDER — ZONISAMIDE 100 MG PO CAPS: 100.0000 mg | ORAL_CAPSULE | Freq: Two times a day (BID) | ORAL | 3 refills | Status: DC

## 2022-09-23 MED ORDER — NURTEC 75 MG PO TBDP: 75.0000 mg | ORAL_TABLET | Freq: Every day | ORAL | 11 refills | Status: DC | PRN

## 2022-10-30 ENCOUNTER — Telehealth: Payer: Self-pay | Admitting: Family Medicine

## 2022-10-30 NOTE — Telephone Encounter (Signed)
LVM and sent mychart msg informing pt of need to reschedule 03/31/23 appt - NP out

## 2022-11-06 ENCOUNTER — Other Ambulatory Visit: Payer: Self-pay | Admitting: Family Medicine

## 2022-11-14 ENCOUNTER — Encounter: Payer: Medicare Other | Attending: Physical Medicine & Rehabilitation | Admitting: Physical Medicine & Rehabilitation

## 2022-11-14 ENCOUNTER — Encounter: Payer: Self-pay | Admitting: Physical Medicine & Rehabilitation

## 2022-11-14 VITALS — BP 125/81 | HR 86 | Ht 65.0 in | Wt 183.2 lb

## 2022-11-14 DIAGNOSIS — M47816 Spondylosis without myelopathy or radiculopathy, lumbar region: Secondary | ICD-10-CM | POA: Diagnosis not present

## 2022-11-14 MED ORDER — TRAMADOL HCL 50 MG PO TABS: ORAL_TABLET | ORAL | 5 refills | Status: DC

## 2022-11-14 NOTE — Progress Notes (Signed)
Subjective:    Patient ID: [name], female    DOB: [birth year], 72 y.o.   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  She has had good relief with sacroiliac injections as well as radiofrequency neurotomy of both sacroiliac joints however she has noted the last set of left-sided injections did not seem to help quite as much. Her pain however feels like it is a little bit higher than it had been.  She points to the waist level.  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 She does not have any pain rating down the left or right lower extremity. No numbness or tingling in the feet. CLINICAL DATA:  Back pain. Recent pubic and sacral fractures.   EXAM: MRI LUMBAR SPINE WITHOUT CONTRAST   TECHNIQUE: Multiplanar, multisequence MR imaging of the lumbar spine was performed. No intravenous contrast was administered.   COMPARISON:  CT of the lumbar spine May 16, 2019.   FINDINGS: The study is significantly degraded by motion.   Segmentation: Standard.   Alignment:  Standard.   Vertebrae: Chronic compression fracture of the superior endplate of L1. Left sacral fracture is incompletely evaluated. Please refer to recent CT of the pelvis for details. No acute fracture of the lumbar spine, evidence of discitis, or bone lesion. Endplate degenerative changes and loss of disc height throughout the lumbar spine.   Conus medullaris and cauda equina: Conus extends to the L1 level. Conus appear normal. Evaluation of the roots of the cauda equina is limited by motion artifact.   Paraspinal and other soft tissues: Negative.   Disc levels:   T12-L1: Posterior disc protrusion and facet degenerative changes. No spinal canal or  neural foraminal stenosis.   L1-2: Disc bulge, facet degenerative changes ligamentum flavum redundancy resulting in mild right and moderate left neural foraminal narrowing. No significant spinal canal stenosis.   L2-3: Disc bulge, facet degenerative changes and ligamentum flavum redundancy resulting in mild spinal canal stenosis and mild bilateral neural foraminal narrowing.   L3-4: Disc bulge, facet degenerative changes, ligamentum flavum redundancy and prominence of the dural fat resulting in severe spinal canal stenosis and mild to moderate bilateral neural foraminal narrowing.   L4-5: Disc bulge, facet degenerative changes, ligamentum flavum redundancy redundancy and prominence of the posterior epidural fat resulting in severe spinal canal stenosis and mild-to-moderate bilateral neural foraminal narrowing.   L5-S1: Disc bulge with osteophytic component and superimposed central disc protrusion resulting in mild spinal canal stenosis and moderate to severe bilateral neural foraminal narrowing.   IMPRESSION: 1. No acute fracture of the lumbar spine. 2. Multilevel degenerative changes of the lumbar spine as described above with severe spinal canal stenosis at L3-4 and L4-5. 3. Moderate to severe bilateral neural foraminal narrowing at L5-S1.   These results will be called to the ordering clinician or representative by the Radiologist Assistant, and communication documented in the PACS or [employer].     Electronically Signed   By: Baldemar Lenis M.D.   On: 05/18/2019 16:24   Pain Inventory Average Pain 8 Pain Right Now 9 My pain is sharp, burning, dull, stabbing, tingling, and aching  In the last 24 hours, has pain interfered with the following? General activity 10  Relation with others 8 Enjoyment of life 8 What TIME of day is your pain at its worst? morning , daytime, evening, and night Sleep (in general) Fair  Pain is worse with: walking,  bending, and standing Pain improves with: rest, heat/ice, medication, and injections Relief from Meds: 5  Family History  Problem Relation Age of Onset   Cancer Mother 26       breast   Alzheimer's disease Mother    Prostate cancer Father    Lung cancer Father    Cancer Brother    Hyperlipidemia Brother    Sleep apnea Brother    Cancer Brother    Hyperlipidemia Brother    Dementia Brother        frontotemporal dementia   Dementia Maternal Grandmother    Cancer Maternal Grandfather    Social History   Socioeconomic History   Marital status: Married    Spouse name: [name]   Number of children: 1   Years of education: 12+   Highest education level: High school graduate  Occupational History   Occupation: ACTIVITY DIRECTOR    Employer: [employer]   Occupation: CNA   Occupation: Retired  Tobacco Use   Smoking status: Former    Current packs/day: 0.00    Average packs/day: 1.5 packs/day for 42.6 years (64.0 ttl pk-yrs)    Types: Cigarettes    Start date: 03/10/1969    Quit date: 11/01/2011    Years since quitting: 11.0   Smokeless tobacco: Never   Tobacco comments:    Smoked 1.5-2 ppd for adult life (salem menthols)  Vaping Use   Vaping status: Never Used  Substance and Sexual Activity   Alcohol use: No    Alcohol/week: 0.0 standard drinks of alcohol   Drug use: No   Sexual activity: Yes    Partners: Male    Birth control/protection: Surgical    Comment: 1 sexual partner in last 12 months: married  Other Topics Concern   Not on file  Social History Narrative    Patient is married ([name]).   Patient has one child and grandchild. Her sons name is [name]. Patient sees them often.   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 Patient spends 6-10 hours with her 6 days a week.    Patient has a college education.   Patient is very active in her church.    Hobbies- outside crafts and church choir.   Social Determinants  of Health   Financial Resource Strain: Low Risk  (06/19/2022)   Overall Financial Resource Strain (CARDIA)    Difficulty of Paying Living Expenses: Not hard at all  Food Insecurity: No Food Insecurity (06/19/2022)   Hunger Vital Sign    Worried About Running Out of Food in the Last Year: Never true    Ran Out of Food in the Last Year: Never true  Transportation Needs: No Transportation Needs (06/19/2022)   PRAPARE - Administrator, Civil Service (Medical): No    Lack of Transportation (Non-Medical): No  Physical Activity: Sufficiently Active (06/19/2022)   Exercise Vital Sign    Days of Exercise per Week: 5 days    Minutes of Exercise per Session: 30 min  Stress: Stress Concern Present (06/19/2022)   [employer] of Occupational Health - Occupational Stress Questionnaire    Feeling of Stress : To some extent  Social Connections: Moderately Integrated (06/19/2022)   Social Connection and Isolation Panel [NHANES]  Frequency of Communication with Friends and Family: More than three times a week    Frequency of Social Gatherings with Friends and Family: Twice a week    Attends Religious Services: More than 4 times per year    Active Member of [employer] or Organizations: Yes    Attends Banker Meetings: Never    Marital Status: Widowed   Past Surgical History:  Procedure Laterality Date   ABDOMINAL HYSTERECTOMY     ANTERIOR CERVICAL DECOMP/DISCECTOMY FUSION N/A 05/24/2020   Procedure: ANTERIOR CERVICAL DECOMPRESSION/DISCECTOMY FUSION 1 LEVEL C3-4;  Surgeon: Venita Lick, MD;  Location: [facility];  Service: Orthopedics;  Laterality: N/A;  3 hrs   BILIARY STENT PLACEMENT N/A 12/12/2015   Procedure: BILIARY STENT PLACEMENT;  Surgeon: Sherrilyn Rist, MD;  Location: [facility] ENDOS[facility];  Service: Endoscopy;  Laterality: N/A;   BLADDER REPAIR     CARPAL TUNNEL RELEASE     CERVICAL FUSION     CERVICAL SPINE SURGERY     CHOLECYSTECTOMY N/A 12/06/2015   Procedure: LAPAROSCOPIC  CHOLECYSTECTOMY WITH  INTRAOPERATIVE CHOLANGIOGRAM;  Surgeon: Almond Lint, MD;  Location: [facility] OR;  Service: General;  Laterality: N/A;   ELBOW SURGERY     ERCP N/A 12/12/2015   Procedure: ENDOSCOPIC RETROGRADE CHOLANGIOPANCREATOGRAPHY (ERCP);  Surgeon: Sherrilyn Rist, MD;  Location: [facility] ENDOSCOPY;  Service: Endoscopy;  Laterality: N/A;   ESOPHAGOGASTRODUODENOSCOPY N/A 01/28/2016   Procedure: ESOPHAGOGASTRODUODENOSCOPY (EGD) Biliary STENT removal;  Surgeon: Sherrilyn Rist, MD;  Location: [facility] ENDOSCOPY;  Service: Gastroenterology;  Laterality: N/A;   IR GENERIC HISTORICAL  12/17/2015   IR US GUIDE BX ASP/DRAIN 12/17/2015 [facility]   IR GENERIC HISTORICAL  12/17/2015   IR GUIDED DRAIN W CATHETER PLACEMENT 12/17/2015 [facility]   IR GENERIC HISTORICAL  12/17/2015   IR SINUS/FIST TUBE CHK-NON GI 12/17/2015 [facility]   KNEE SURGERY     LUNG SURGERY     TEE WITHOUT CARDIOVERSION N/A 12/19/2015   Procedure: TRANSESOPHAGEAL ECHOCARDIOGRAM (TEE);  Surgeon: Wendall Stade, MD;  Location: [facility] ENDOSCOPY;  Service: Cardiovascular;  Laterality: N/A;   Past Surgical History:  Procedure Laterality Date   ABDOMINAL HYSTERECTOMY     ANTERIOR CERVICAL DECOMP/DISCECTOMY FUSION N/A 05/24/2020   Procedure: ANTERIOR CERVICAL DECOMPRESSION/DISCECTOMY FUSION 1 LEVEL C3-4;  Surgeon: Venita Lick, MD;  [facility];  Service: Orthopedics;  Laterality: N/A;  3 hrs   BILIARY STENT PLACEMENT N/A 12/12/2015   Procedure: BILIARY STENT PLACEMENT;  Surgeon: Sherrilyn Rist, MD;  Location[facility];  Service: Endoscopy;  Laterality: N/A;   BLADDER REPAIR     CARPAL TUNNEL RELEASE     CERVICAL FUSION     CERVICAL SPINE SURGERY     CHOLECYSTECTOMY N/A 12/06/2015   Procedure: LAPAROSCOPIC CHOLECYSTECTOMY WITH  INTRAOPERATIVE CHOLANGIOGRAM;  Surgeon: Almond Lint, MD;  Location[facility];  Service: General;  Laterality: N/A;   ELBOW SURGERY     ERCP N/A 12/12/2015   Procedure: ENDOSCOPIC RETROGRADE  CHOLANGIOPANCREATOGRAPHY (ERCP);  Surgeon: Sherrilyn Rist, MD;  Location: [facility] ENDOSCOPY;  Service: Endoscopy;  Laterality: N/A;   ESOPHAGOGASTRODUODENOSCOPY N/A 01/28/2016   Procedure: ESOPHAGOGASTRODUODENOSCOPY (EGD) Biliary STENT removal;  Surgeon: Sherrilyn Rist, MD;  Location: [facility];  Service: Gastroenterology;  Laterality: N/A;   IR GENERIC HISTORICAL  12/17/2015   IR US GUIDE BX ASP/DRAIN 12/17/2015 [facility]   IR GENERIC HISTORICAL  12/17/2015   IR GUIDED DRAIN W CATHETER PLACEMENT 12/17/2015 [facility]   IR GENERIC  HISTORICAL  12/17/2015   IR SINUS/FIST TUBE CHK-NON GI 12/17/2015 [facility]   KNEE SURGERY     LUNG SURGERY     TEE WITHOUT CARDIOVERSION N/A 12/19/2015   Procedure: TRANSESOPHAGEAL ECHOCARDIOGRAM (TEE);  Surgeon: Wendall Stade, MD;  Location: [facility] ENDOSCOPY;  Service: Cardiovascular;  Laterality: N/A;   Past Medical History:  Diagnosis Date   Anemia    Cervical neuritis    Chronic kidney disease    stage 3   Dyslipidemia    GERD (gastroesophageal reflux disease)    History of transient ischemic attack (TIA)    HTN (hypertension)    Hyperlipidemia    Migraine    Migraine headache    Occipital neuralgia    Left   Pulmonary embolism (HCC) 2017   Renal insufficiency    Stroke (HCC) 10/2013   Ht 5\' 5"  (1.651 m)   Wt 183 lb 3.2 oz (83.1 kg)   BMI 30.49 kg/m   Opioid Risk Score:   Fall Risk Score:  `1  Depression screen [facility] 2/9     11/14/2022    3:28 PM 08/05/2022    8:26 AM 07/18/2022    3:31 PM 06/24/2022    2:36 PM 06/19/2022    8:37 AM 06/18/2022    8:40 AM 06/02/2022   12:15 PM  Depression screen PHQ 2/9  Decreased Interest 0 1 1 0 1 1 2   Down, Depressed, Hopeless  1 1 0 1 1 2   PHQ - 2 Score 0 2 2 0 2 2 4   Altered sleeping  0   1 1 2   Tired, decreased energy  1   1 1 1   Change in appetite  1   3 3 1   Feeling bad or failure about yourself   0   0 0 0  Trouble concentrating  1   1 1 1   Moving slowly or fidgety/restless  0   0 0 0   Suicidal thoughts  0   0 0 0  PHQ-9 Score  5   8 8 9   Difficult doing work/chores     Not difficult at all  Not difficult at all     Review of Systems  Musculoskeletal:  Positive for back pain and gait problem.  All other systems reviewed and are negative.     Objective:   Physical Exam Vitals and nursing note reviewed.  Constitutional:      Appearance: She is obese.  HENT:     Head: Normocephalic and atraumatic.  Eyes:     Extraocular Movements: Extraocular movements intact.     Conjunctiva/sclera: Conjunctivae normal.     Pupils: Pupils are equal, round, and reactive to light.  Musculoskeletal:     Comments: There is tenderness palpation on the left L4-L5 paraspinal area Her forward flexion is normal in the lumbar spine extension causes pain on the left side lower lumbar area. No tenderness over the PSIS area  Skin:    General: Skin is warm and dry.  Neurological:     Mental Status: She is alert and oriented to person, place, and time.  Psychiatric:        Mood and Affect: Mood normal.        Behavior: Behavior normal.    Ambulates without assistive device no evidence of toe drag or knee instability Negative straight leg raise bilaterally Lower extremity strength is normal       Assessment & Plan:  #1.  Chronic lumbar and pelvic pain after  fall and fractures also has lumbar spinal stenosis.  Her current pain appears to be more proximal and in the L4-L5 region of the left-sided lumbar spine area.  Given her imaging studies this is most likely facet joint mediated pain. Will do diagnostic injections left L3-4 medial branch left L5 dorsal ramus if 80% or better relief would repeat and if a second improvement of 80% proceed onto radiofrequency neurotomy.

## 2022-11-20 DIAGNOSIS — M19011 Primary osteoarthritis, right shoulder: Secondary | ICD-10-CM | POA: Diagnosis not present

## 2022-12-02 ENCOUNTER — Telehealth: Payer: Self-pay

## 2022-12-02 ENCOUNTER — Ambulatory Visit: Payer: Medicare Other | Admitting: Family Medicine

## 2022-12-02 ENCOUNTER — Other Ambulatory Visit: Payer: Self-pay

## 2022-12-02 VITALS — BP 149/92 | HR 84 | Temp 98.4°F | Ht 66.0 in | Wt 175.4 lb

## 2022-12-02 DIAGNOSIS — B029 Zoster without complications: Secondary | ICD-10-CM | POA: Diagnosis not present

## 2022-12-02 DIAGNOSIS — I10 Essential (primary) hypertension: Secondary | ICD-10-CM

## 2022-12-02 MED ORDER — VALACYCLOVIR HCL 1 G PO TABS
1000.0000 mg | ORAL_TABLET | Freq: Three times a day (TID) | ORAL | 0 refills | Status: AC
Start: 2022-12-02 — End: 2022-12-12

## 2022-12-02 NOTE — Patient Instructions (Signed)
It was wonderful to see you today.  Please bring ALL of your medications with you to every visit.   Today we talked about:  I think you have Shingles, please take Valcyclovir 1g TID for the next 10 days. If you have a high fever that doesn't go away with Tylenol, neck pain or stiffness, numbness, weakness, or vision changes or confusion/severe headache- please go to the emergency room. We will see you back tomorrow for close follow up.  Thank you for choosing [facility].   Please call [phone] with any questions about today's appointment.  Please arrive at least 15 minutes prior to your scheduled appointments.   If you had blood work today, I will send you a MyChart message or a letter if results are normal. Otherwise, I will give you a call.   If you had a referral placed, they will call you to set up an appointment. Please give [place] a call if you don't hear back in the next 2 weeks.   If you need additional refills before your next appointment, please call your pharmacy first.   Burley Saver, MD  Family Medicine

## 2022-12-02 NOTE — Progress Notes (Signed)
SUBJECTIVE:   CHIEF COMPLAINT / HPI:   Rash, fever - found this AM. On her R groin, was vesicles and some had popped.  Woke her up from sleep.  She also notes this morning she had a fever of 103 F, and she took 1000 mg of Tylenol before coming in.  She notes that she has had shingles before several years ago and it was on the right side but higher up on her stomach.  She notes this feels similar.  She denies any neck pain.  She does note a small left-sided headache, 5 out of 10, that started yesterday afternoon.  Some associated nausea.  She notes she does have migraine headaches although this does not feel exactly the same.  She denies photo or phonophobia.  She denies vision changes.  She denies numbness or weakness.  She feels like it is dull but is slightly better after the Tylenol this morning.  She has had a couple of loose stools daily for the past 2 days, several per day, not watery, no blood in the stool.  She denies any urinary symptoms including dysuria, increased urinary frequency, or hematuria.  No flank pain.  She does note some chronic back pain on both sides, she denies any symptoms of bowel or bladder incontinence or saddle paresthesias.  She does have some chronic rhinitis and a dry cough that she feels like is her seasonal allergies, she denies shortness of breath or chest pain or sputum production.  HTN- took all of home medications this AM.  Denies chest pain or shortness of breath.  PERTINENT  PMH / PSH: TIA, PVD, PE, spinal stenosis, HTN, chronic migraines, CKD stage 3a  OBJECTIVE:   BP (!) 149/92   Pulse 84   Temp 98.4 F (36.9 C) (Oral)   Ht 5\' 6"  (1.676 m)   Wt 175 lb 6.4 oz (79.6 kg)   SpO2 99%   BMI 28.31 kg/m   General: A&O, NAD HEENT: No sign of trauma, EOM grossly intact Neck: Normal range of motion, no meningismus, no pain to palpation Cardiac: RRR, no m/r/g Respiratory: CTAB, normal WOB, no w/c/r GI: Soft, NTTP, non-distended, no CVA tenderness  bilaterally Skin: On the right side L1 dermatome are vesicles clustered with an erythematous base, some deroofed, overall erythematous without purulent drainage or warmth to touch, not crossing midline   Extremities: NTTP, no peripheral edema. Psych: Appropriate mood and affect  Neuro: Memory: Intact . PEERLA. Cranial nerves: II through XII are intact. Normal visual fields bilaterally. Sensation normal upper and lower ext's bilaterally. Strength 5/5 in upper and lower ext's bilaterally. FTN, rapid hand alternating movements normal bilaterally. DTRs normal at biceps and knee bilaterally. Gait normal.    ASSESSMENT/PLAN:   Assessment & Plan Herpes zoster without complication Suspect herpes zoster based on presentation and pain, patient with a history of this Started valacyclovir 1 g 3 times daily x 7 to 10 days With mild headache today, with normal neuro exam and no meningeal signs thus low suspicion for CNS involvement, she notes she did have a fever this morning but is afebrile here after Tylenol, continue supportive management for headache Will schedule close follow-up tomorrow and discussed signs or symptoms to watch for including worsening headache, vomiting and unable to tolerate p.o., neck stiffness, fever that does not resolve with medication, confusion, new neurologic symptoms Essential hypertension Repeat blood pressure improved but not at goal, suspect elevated in the setting of acute pain, no changes to home  regimen     Billey Co, MD [facility]

## 2022-12-02 NOTE — Telephone Encounter (Signed)
Patient calls nurse line regarding possible shingles on right side of abdomen.   Temperature of 103.

## 2022-12-02 NOTE — Assessment & Plan Note (Signed)
Repeat blood pressure improved but not at goal, suspect elevated in the setting of acute pain, no changes to home regimen

## 2022-12-03 ENCOUNTER — Encounter: Payer: Self-pay | Admitting: Family Medicine

## 2022-12-03 ENCOUNTER — Ambulatory Visit: Payer: Medicare Other

## 2022-12-03 ENCOUNTER — Ambulatory Visit: Payer: Self-pay | Admitting: Family Medicine

## 2022-12-03 VITALS — BP 128/81 | HR 80 | Ht 66.0 in | Wt 177.8 lb

## 2022-12-03 DIAGNOSIS — B029 Zoster without complications: Secondary | ICD-10-CM | POA: Insufficient documentation

## 2022-12-03 DIAGNOSIS — I10 Essential (primary) hypertension: Secondary | ICD-10-CM

## 2022-12-03 NOTE — Assessment & Plan Note (Signed)
Well-controlled today, suspect elevation yesterday was due to acute pain in setting of active herpes zoster infection. -Continue losartan, torsemide, amlodipine

## 2022-12-03 NOTE — Patient Instructions (Addendum)
It was great to see you today! Thank you for choosing [facility] for your primary care.  Today we addressed:  Shingles Finish up your valacyclovir (Valtrex) and keep drinking lots of water.  I am glad you are feeling better!  You can go back to work, but make sure to keep the area covered and wash your hands.  You should return to our clinic if you get a headache again or if your shingles rash gets significantly worse.  Thank you for coming to see [place] at [facility] and for the opportunity to care for you!  Dashan Chizmar, MD 12/03/2022, 11:21 AM

## 2022-12-03 NOTE — Assessment & Plan Note (Signed)
Headache is resolved, no neurological symptoms, neurological exam unremarkable, no further fevers.  No concern for CNS involvement or meningitis at this time.  Impressive improvement in symptoms since yesterday, erythematous rash still present but vesicles crusted.  Presentation still consistent with herpes zoster. -Continue valacyclovir 1 g TID x7-10 days -Follow-up as needed

## 2022-12-03 NOTE — Progress Notes (Signed)
   SUBJECTIVE:   CHIEF COMPLAINT / HPI:  [name] is a 72 y.o. female with a pertinent past medical history of migraines and osteoarthritis presenting to the clinic for close follow up after starting shingles treatment yesterday.  Shingles Patient was seen in clinic 9/24 and vesicular rash was noted on R lower abdomen and groin.  Started on valacyclovir for herpes zoster, did have previous episode.  Noted to have moderate (5/10) left-sided headache with acute onset, but with normal neurological exam and no meningeal signs.  Was febrile yesterday to 40 F. Patient is now following up to ensure improvement of headache.  Today, she reports that she has been taking valacyclovir and pain at site of rash has improved dramatically.  She has been using corn starch powder and it has helped with the redness.  Headache and back pain went away last night.  Feels dramatically better and is much happier.  Denies fevers, chills, nausea, vomiting.  No new symptoms.  Hypertension BP: 128/81 today. Home medications include: Losartan 25 mg daily, furosemide 40 mg daily, amlodipine 5 mg daily.  She endorses taking these medications as prescribed. Does check blood pressure at home. Patient has had a BMP in the past 1 year. Denies hypertension symptoms including dizziness and orthostatic symptoms.   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   OBJECTIVE:   BP 128/81   Pulse 80   Ht 5\' 6"  (1.676 m)   Wt 177 lb 12.8 oz (80.6 kg)   SpO2 99%   BMI 28.70 kg/m   General: Age-appropriate, resting comfortably in chair, NAD, alert and at baseline. HEENT:  Head: Normocephalic, atraumatic. No tenderness to percussion over sinuses. Eyes: PERRLA. No conjunctival erythema or scleral injections. Nose: Non-erythematous turbinates. No  rhinorrhea. Mouth/Oral: Clear, no tonsillar exudate. MMM. Neck: Supple. No LAD. Pulmonary: Clear bilaterally to ascultation. No increased WOB, no accessory muscle usage. No wheezes, crackles, or rhonchi. Skin: Scattered scabbed papules with surrounding skin erythema in right groin and R L1 dermatomal distribution, no drainage, bleeding, warmth to touch. Extremities: No peripheral edema bilaterally. Capillary refill <2 seconds.  Neurological Examination: Grossly normal MS: Awake, alert, interactive. Normal eye contact, answered the questions appropriately, speech was fluent, normal comprehension.  Attention and concentration were normal. Cranial Nerves: Pupils were equal and reactive to light (5-73mm);  EOM normal, no nystagmus, no ptsosis, no double vision; intact facial sensation; face symmetric with full strength of facial muscles; hearing intact to finger rub bilaterally; palate elevation is symmetric; tongue protrusion is symmetric with full movement to both sides; sternocleidomastoid and trapezius are with normal strength. Strength: Normal strength in all muscle groups. Sensation: Intact to light touch on face and all extremities.   ASSESSMENT/PLAN:   Essential hypertension Well-controlled today, suspect elevation yesterday was due to acute pain in setting of active herpes zoster infection. -Continue losartan, torsemide, amlodipine  Herpes zoster Headache is resolved, no neurological symptoms, neurological exam unremarkable, no further fevers.  No concern for CNS involvement or meningitis at this time.  Impressive improvement in symptoms since yesterday, erythematous rash still present but vesicles crusted.  Presentation still consistent with herpes zoster. -Continue valacyclovir 1 g TID x7-10 days -Follow-up as needed  Health maintenance Patient declines influenza and COVID vaccines today, open to discussing in the future.  Courtney Notch Sharion Dove, MD [facility]

## 2022-12-08 ENCOUNTER — Ambulatory Visit (INDEPENDENT_AMBULATORY_CARE_PROVIDER_SITE_OTHER): Payer: Medicare Other | Admitting: Family

## 2022-12-08 ENCOUNTER — Encounter (HOSPITAL_BASED_OUTPATIENT_CLINIC_OR_DEPARTMENT_OTHER): Payer: Self-pay | Admitting: Family

## 2022-12-08 VITALS — BP 134/78 | HR 84 | Ht 66.0 in | Wt 182.0 lb

## 2022-12-08 DIAGNOSIS — G4733 Obstructive sleep apnea (adult) (pediatric): Secondary | ICD-10-CM

## 2022-12-08 DIAGNOSIS — E785 Hyperlipidemia, unspecified: Secondary | ICD-10-CM | POA: Diagnosis not present

## 2022-12-08 DIAGNOSIS — I10 Essential (primary) hypertension: Secondary | ICD-10-CM | POA: Diagnosis not present

## 2022-12-08 DIAGNOSIS — I2584 Coronary atherosclerosis due to calcified coronary lesion: Secondary | ICD-10-CM | POA: Diagnosis not present

## 2022-12-08 DIAGNOSIS — I2510 Atherosclerotic heart disease of native coronary artery without angina pectoris: Secondary | ICD-10-CM

## 2022-12-08 DIAGNOSIS — Z87898 Personal history of other specified conditions: Secondary | ICD-10-CM | POA: Diagnosis not present

## 2022-12-08 NOTE — Patient Instructions (Signed)
Medication Instructions:  Your physician has recommended you make the following change in your medication:   STOP Zetia  *If you need a refill on your cardiac medications before your next appointment, please call your pharmacy*  Follow-Up: At [facility], you and your health needs are our priority.  As part of our continuing mission to provide you with exceptional heart care, we have created designated Provider Care Teams.  These Care Teams include your primary Cardiologist (physician) and Advanced Practice Providers (APPs -  Physician Assistants and Nurse Practitioners) who all work together to provide you with the care you need, when you need it.  We recommend signing up for the patient portal called "MyChart".  Sign up information is provided on this After Visit Summary.  MyChart is used to connect with patients for Virtual Visits (Telemedicine).  Patients are able to view lab/test results, encounter notes, upcoming appointments, etc.  Non-urgent messages can be sent to your provider as well.   To learn more about what you can do with MyChart, go to [email or web address].    Your next appointment:   6 month(s)  Provider:   Chilton Si, MD or Gillian Shields, NP    Other Instructions  Heart Healthy Diet Recommendations: A low-salt diet is recommended. Meats should be grilled, baked, or boiled. Avoid fried foods. Focus on lean protein sources like fish or chicken with vegetables and fruits. The American Heart Association is a Chief Technology Officer!  American Heart Association Diet and Lifeystyle Recommendations   Exercise recommendations: The American Heart Association recommends 150 minutes of moderate intensity exercise weekly. Try 30 minutes of moderate intensity exercise 4-5 times per week. This could include walking, jogging, or swimming.

## 2022-12-08 NOTE — Progress Notes (Signed)
[address] [place], [place] [address] [phone]  ------------------------------------------------------------------- Transesophageal Echocardiography  Patient:    [name], [name] MR #:       [id number] Study Date: 12/19/2015 Gender:     F Age:        72 Height: Weight: BSA: Pt. Status: Room:  Layla Maw, M.D. PERFORMING   Charlton Haws, M.D. REFERRING    Charlton Haws, M.D. ATTENDING    Ccs, Call [phone] ADMITTING    Ccs, Md SONOGRAPHER  Jeryl Columbia  cc:  ------------------------------------------------------------------- LV EF: 55% -   60%  ------------------------------------------------------------------- Indications:      Bacteremia  790.7.  ------------------------------------------------------------------- History:   PMH:  Infection by Streptococcus, viridans group. Transient ischemic attack.  Risk factors:  Hypertension. Dyslipidemia.  ------------------------------------------------------------------- Study Conclusions  - Left ventricle: The cavity size was normal. Wall thickness was normal. Systolic function was normal. The estimated ejection fraction was in the range of 55% to 60%. - Aortic valve: There was trivial regurgitation. - Mitral valve: There was mild regurgitation. - Right atrium: No evidence of thrombus in the atrial cavity or appendage. - Atrial septum: There was increased thickness of the septum, consistent with lipomatous hypertrophy. No defect or patent foramen ovale was identified.  Impressions:  - No evidence of endocarditis. There was no evidence of a vegetation.  ------------------------------------------------------------------- Study data:   Study status:  Routine.  Consent:  The risks, benefits, and alternatives to the procedure were explained to the patient and informed consent was obtained.  Procedure:  Initial setup. The patient was brought to the laboratory. Surface ECG leads were monitored. Sedation. Conscious sedation was administered. Transesophageal echocardiography. Topical anesthesia was obtained using viscous lidocaine. A transesophageal probe was inserted by the attending cardiologist. Image quality was adequate.  Study completion:  The patient tolerated the procedure well. There were no complications.  Administered medications:   Fentanyl, . Midazolam, 5mg .          Diagnostic transesophageal echocardiography.  2D and color Doppler.  Birthdate:  Patient birthdate: 1950/09/18.  Age:  Patient is 72 yr old.  Sex:  Gender: female.  Blood pressure:     162/88  Patient status:  Inpatient. Study date:  Study date: 12/19/2015. Study time: 02:58 PM. Location:   Endoscopy.  -------------------------------------------------------------------  ------------------------------------------------------------------- Left ventricle:  The cavity size was normal. Wall thickness was normal. Systolic function was normal. The estimated ejection fraction was in the range of 55% to 60%.  ------------------------------------------------------------------- Aortic valve:   Mildly thickened leaflets.  Doppler:  There was trivial regurgitation.  ------------------------------------------------------------------- Aorta:  The aorta was normal, not dilated, and non-diseased.  ------------------------------------------------------------------- Mitral valve:   Doppler:  There was mild regurgitation.  ------------------------------------------------------------------- Left atrium:  The atrium was normal in size.  ------------------------------------------------------------------- Atrial septum:  There was increased thickness of the septum, consistent with lipomatous hypertrophy. No defect or patent foramen ovale was identified.  ------------------------------------------------------------------- Right ventricle:  The cavity size was normal. Wall thickness was normal. Systolic function was normal.  ------------------------------------------------------------------- Pulmonic valve:    Doppler:  There was trivial regurgitation.  ------------------------------------------------------------------- Tricuspid valve:   Doppler:  There was mild regurgitation.  ------------------------------------------------------------------- Right atrium:  The atrium was normal in size.  No evidence of thrombus in the atrial cavity or appendage.  ------------------------------------------------------------------- Pericardium:  The pericardium was normal in appearance. There was no pericardial effusion.  ------------------------------------------------------------------- Post  procedure conclusions Ascending Aorta:  - The aorta was normal, not dilated, and non-diseased.  ------------------------------------------------------------------- Prepared and  Cardiology Office Note:  .   Date:  12/08/2022  ID:  [name], DOB 26-Apr-1950, MRN [id number] PCP: Erick Alley, DO   HeartCare Providers Cardiologist:  Chilton Si, MD    History of Present Illness: [name] is a 72 y.o. female with a hx of HTN, coronary artery calcification by CT, HLD, CKD III, CVA, left subclavian artery stenosis, prior tobacco.   She was admitted May 2019 with shortness of breath.  Lower extremity Doppler negative for DVT.  VQ scan showed perfusion defect deemed intermittent probability.  She was treated with Eliquis for 6 months.  Myoview July 2019 EF 66%, no ischemia.  Repeat Myoview October 2021 EF 66%, apical anterior and apical defect due to breast attenuation.  Referred to pulmonology and felt to be related to deconditioning and reactive airway disease, treated with Breo and albuterol.  When seen 03/16/2020 by Dr. Duke Salvia amlodipine was increased to 5 mg due to hypertension.  When seen 05/22/2020 she was provided clearance for anterior cervical vasectomy and fusion of C3-4   At clinic visit 06/28/2021 she noted difficulty with statins and was started on Incliseron. Sleep study 08/2021 with moderate OSA recommended for CPAP.     Seen 08/05/22 with 3 episodes of syncope without prodromal symptoms.  She was not orthostatic.  30-day heart monitor, CT neck, chest as well as echocardiogram were recommended. Echo 09/04/22 LVEF 60 to 65%, mild LVH, grade 1 diastolic dysfunction, mild to moderate aortic regurgitation.  CT chest 08/11/22 no PE, known compression fracture of thoracic spine, coronary calcification, aortic atherosclerosis. 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 She did not complete monitor and as no recurrent symptoms, it was deferred.    She presents today for follow up independently. Enjoyed trip to [place]. 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 Reports no shortness of breath at rest and stable minimal dyspnea with more than usual exertion. Reports no chest pain, pressure, or tightness. No edema, orthopnea, PND. Reports no palpitations.  Checking BP at home intermittently with readings at goal <130/80 when checked after her medications.   ROS: Please see the history of present illness.    All other systems reviewed and are negative.   Studies Reviewed: Marland Kitchen   EKG Interpretation Date/Time:  Monday December 08 2022 14:54:33 EDT Ventricular Rate:  84 PR Interval:  148 QRS Duration:  84 QT Interval:  376 QTC Calculation: 444 R Axis:   49  Text Interpretation: Normal sinus rhythm  No acute ST/T wave changes. Confirmed by Gillian Shields ([id number]) on 12/08/2022 3:24:12 PM    Cardiac Studies & Procedures     STRESS TESTS  MYOCARDIAL PERFUSION IMAGING 12/20/2019  Narrative  The left ventricular ejection fraction is hyperdynamic (>65%).  Nuclear stress EF: 66%.  There was no ST segment deviation noted during stress.  Defect 1: There is a small defect of moderate severity present in the apical anterior and apex location.  This is a low risk study.  Low risk stress nuclear study with partially reversible distal anterior/apical defect likely related to shifting breast attenuation.  Cannot rule out minimal ischemia.  Gated ejection fraction 66% with normal wall motion.   ECHOCARDIOGRAM  ECHOCARDIOGRAM COMPLETE 09/04/2022  Narrative ECHOCARDIOGRAM REPORT    Patient Name:   [name] Date of Exam: 09/04/2022 Medical Rec #:  [id number]  Height:       66.0 in Accession #:    [id number]       Weight:       177.0 lb Date of Birth:  05-Feb-1951        BSA:          1.899 m Patient Age:    71 years         BP:           125/78 mmHg Patient Gender: F                HR:           74 bpm. Exam Location:  Outpatient  Procedure: 2D Echo, 3D Echo, Cardiac  Doppler, Color Doppler and Strain Analysis  Indications:    Syncope  History:        Patient has prior history of Echocardiogram examinations, most recent 01/10/2019. TIA, Signs/Symptoms:Dizziness/Lightheadedness; Risk Factors:Former Smoker, Dyslipidemia and Hypertension. Pulmonary Embolism.  Sonographer:    Jeryl Columbia RDCS Referring Phys: [id number] TIFFANY [place]  IMPRESSIONS   1. Left ventricular ejection fraction, by estimation, is 60 to 65%. The left ventricle has normal function. The left ventricle has no regional wall motion abnormalities. There is mild concentric left ventricular hypertrophy. Left ventricular diastolic parameters are consistent with Grade I diastolic dysfunction (impaired relaxation). 2. Right ventricular systolic function is normal. The right ventricular size is normal. 3. The mitral valve is normal in structure. Mild mitral valve regurgitation. No evidence of mitral stenosis. 4. The aortic valve is normal in structure. Aortic valve regurgitation is mild to moderate. Aortic valve sclerosis/calcification is present, without any evidence of aortic stenosis. 5. The inferior vena cava is normal in size with greater than 50% respiratory variability, suggesting right atrial pressure of 3 mmHg.  FINDINGS Left Ventricle: Left ventricular ejection fraction, by estimation, is 60 to 65%. The left ventricle has normal function. The left ventricle has no regional wall motion abnormalities. Global longitudinal strain performed but not reported based on interpreter judgement due to suboptimal tracking. The left ventricular internal cavity size was normal in size. There is mild concentric left ventricular hypertrophy. Left ventricular diastolic parameters are consistent with Grade I diastolic dysfunction (impaired relaxation).  Right Ventricle: The right ventricular size is normal. No increase in right ventricular wall thickness. Right ventricular systolic function is  normal.  Left Atrium: Left atrial size was normal in size.  Right Atrium: Right atrial size was normal in size.  Pericardium: There is no evidence of pericardial effusion. Presence of epicardial fat layer.  Mitral Valve: The mitral valve is normal in structure. Mild mitral valve regurgitation. No evidence of mitral valve stenosis.  Tricuspid Valve: The tricuspid valve is normal in structure. Tricuspid valve regurgitation is mild . No evidence of tricuspid stenosis.  Aortic Valve: The aortic valve is normal in structure. Aortic valve regurgitation is mild to moderate. Aortic regurgitation PHT measures 446 msec. Aortic valve sclerosis/calcification is present, without any evidence of aortic stenosis. Aortic valve mean gradient measures 8.0 mmHg. Aortic valve peak gradient measures 14.7 mmHg. Aortic valve area, by VTI measures 1.52 cm.  Pulmonic Valve: The pulmonic valve was not well visualized. Pulmonic valve regurgitation is not visualized. No evidence of pulmonic stenosis.  Aorta: The aortic root is normal in size and structure.  Venous: The inferior vena cava is normal in size with greater than 50% respiratory variability, suggesting right atrial pressure of 3 mmHg.  IAS/Shunts: No atrial level shunt detected by color flow  [address] [place], [place] [address] [phone]  ------------------------------------------------------------------- Transesophageal Echocardiography  Patient:    [name], [name] MR #:       [id number] Study Date: 12/19/2015 Gender:     F Age:        72 Height: Weight: BSA: Pt. Status: Room:  Layla Maw, M.D. PERFORMING   Charlton Haws, M.D. REFERRING    Charlton Haws, M.D. ATTENDING    Ccs, Call [phone] ADMITTING    Ccs, Md SONOGRAPHER  Jeryl Columbia  cc:  ------------------------------------------------------------------- LV EF: 55% -   60%  ------------------------------------------------------------------- Indications:      Bacteremia  790.7.  ------------------------------------------------------------------- History:   PMH:  Infection by Streptococcus, viridans group. Transient ischemic attack.  Risk factors:  Hypertension. Dyslipidemia.  ------------------------------------------------------------------- Study Conclusions  - Left ventricle: The cavity size was normal. Wall thickness was normal. Systolic function was normal. The estimated ejection fraction was in the range of 55% to 60%. - Aortic valve: There was trivial regurgitation. - Mitral valve: There was mild regurgitation. - Right atrium: No evidence of thrombus in the atrial cavity or appendage. - Atrial septum: There was increased thickness of the septum, consistent with lipomatous hypertrophy. No defect or patent foramen ovale was identified.  Impressions:  - No evidence of endocarditis. There was no evidence of a vegetation.  ------------------------------------------------------------------- Study data:   Study status:  Routine.  Consent:  The risks, benefits, and alternatives to the procedure were explained to the patient and informed consent was obtained.  Procedure:  Initial setup. The patient was brought to the laboratory. Surface ECG leads were monitored. Sedation. Conscious sedation was administered. Transesophageal echocardiography. Topical anesthesia was obtained using viscous lidocaine. A transesophageal probe was inserted by the attending cardiologist. Image quality was adequate.  Study completion:  The patient tolerated the procedure well. There were no complications.  Administered medications:   Fentanyl, . Midazolam, 5mg .          Diagnostic transesophageal echocardiography.  2D and color Doppler.  Birthdate:  Patient birthdate: 1950/09/18.  Age:  Patient is 72 yr old.  Sex:  Gender: female.  Blood pressure:     162/88  Patient status:  Inpatient. Study date:  Study date: 12/19/2015. Study time: 02:58 PM. Location:   Endoscopy.  -------------------------------------------------------------------  ------------------------------------------------------------------- Left ventricle:  The cavity size was normal. Wall thickness was normal. Systolic function was normal. The estimated ejection fraction was in the range of 55% to 60%.  ------------------------------------------------------------------- Aortic valve:   Mildly thickened leaflets.  Doppler:  There was trivial regurgitation.  ------------------------------------------------------------------- Aorta:  The aorta was normal, not dilated, and non-diseased.  ------------------------------------------------------------------- Mitral valve:   Doppler:  There was mild regurgitation.  ------------------------------------------------------------------- Left atrium:  The atrium was normal in size.  ------------------------------------------------------------------- Atrial septum:  There was increased thickness of the septum, consistent with lipomatous hypertrophy. No defect or patent foramen ovale was identified.  ------------------------------------------------------------------- Right ventricle:  The cavity size was normal. Wall thickness was normal. Systolic function was normal.  ------------------------------------------------------------------- Pulmonic valve:    Doppler:  There was trivial regurgitation.  ------------------------------------------------------------------- Tricuspid valve:   Doppler:  There was mild regurgitation.  ------------------------------------------------------------------- Right atrium:  The atrium was normal in size.  No evidence of thrombus in the atrial cavity or appendage.  ------------------------------------------------------------------- Pericardium:  The pericardium was normal in appearance. There was no pericardial effusion.  ------------------------------------------------------------------- Post  procedure conclusions Ascending Aorta:  - The aorta was normal, not dilated, and non-diseased.  ------------------------------------------------------------------- Prepared and  Cardiology Office Note:  .   Date:  12/08/2022  ID:  [name], DOB 26-Apr-1950, MRN [id number] PCP: Erick Alley, DO   HeartCare Providers Cardiologist:  Chilton Si, MD    History of Present Illness: [name] is a 72 y.o. female with a hx of HTN, coronary artery calcification by CT, HLD, CKD III, CVA, left subclavian artery stenosis, prior tobacco.   She was admitted May 2019 with shortness of breath.  Lower extremity Doppler negative for DVT.  VQ scan showed perfusion defect deemed intermittent probability.  She was treated with Eliquis for 6 months.  Myoview July 2019 EF 66%, no ischemia.  Repeat Myoview October 2021 EF 66%, apical anterior and apical defect due to breast attenuation.  Referred to pulmonology and felt to be related to deconditioning and reactive airway disease, treated with Breo and albuterol.  When seen 03/16/2020 by Dr. Duke Salvia amlodipine was increased to 5 mg due to hypertension.  When seen 05/22/2020 she was provided clearance for anterior cervical vasectomy and fusion of C3-4   At clinic visit 06/28/2021 she noted difficulty with statins and was started on Incliseron. Sleep study 08/2021 with moderate OSA recommended for CPAP.     Seen 08/05/22 with 3 episodes of syncope without prodromal symptoms.  She was not orthostatic.  30-day heart monitor, CT neck, chest as well as echocardiogram were recommended. Echo 09/04/22 LVEF 60 to 65%, mild LVH, grade 1 diastolic dysfunction, mild to moderate aortic regurgitation.  CT chest 08/11/22 no PE, known compression fracture of thoracic spine, coronary calcification, aortic atherosclerosis. 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 She did not complete monitor and as no recurrent symptoms, it was deferred.    She presents today for follow up independently. Enjoyed trip to [place]. 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 Reports no shortness of breath at rest and stable minimal dyspnea with more than usual exertion. Reports no chest pain, pressure, or tightness. No edema, orthopnea, PND. Reports no palpitations.  Checking BP at home intermittently with readings at goal <130/80 when checked after her medications.   ROS: Please see the history of present illness.    All other systems reviewed and are negative.   Studies Reviewed: Marland Kitchen   EKG Interpretation Date/Time:  Monday December 08 2022 14:54:33 EDT Ventricular Rate:  84 PR Interval:  148 QRS Duration:  84 QT Interval:  376 QTC Calculation: 444 R Axis:   49  Text Interpretation: Normal sinus rhythm  No acute ST/T wave changes. Confirmed by Gillian Shields ([id number]) on 12/08/2022 3:24:12 PM    Cardiac Studies & Procedures     STRESS TESTS  MYOCARDIAL PERFUSION IMAGING 12/20/2019  Narrative  The left ventricular ejection fraction is hyperdynamic (>65%).  Nuclear stress EF: 66%.  There was no ST segment deviation noted during stress.  Defect 1: There is a small defect of moderate severity present in the apical anterior and apex location.  This is a low risk study.  Low risk stress nuclear study with partially reversible distal anterior/apical defect likely related to shifting breast attenuation.  Cannot rule out minimal ischemia.  Gated ejection fraction 66% with normal wall motion.   ECHOCARDIOGRAM  ECHOCARDIOGRAM COMPLETE 09/04/2022  Narrative ECHOCARDIOGRAM REPORT    Patient Name:   [name] Date of Exam: 09/04/2022 Medical Rec #:  [id number]

## 2022-12-15 NOTE — Progress Notes (Unsigned)
    SUBJECTIVE:   Chief compliant/HPI: annual examination  [name] is a 72 y.o. who presents today for an annual exam.    History tabs reviewed and updated.   HTN Took amlodipine this morning. Takes losartan at bedtime.  Denies any lightheadedness, changes in vision.  Other than mild sick symptoms, feels well.  Sick symptoms: For past 4 days  Headache- constant: constant, located in maxillary and frontal sinus' Had sore throat Sunday. Has improved but still scratchy No fever, no body aches Some rhinorrhea, a little congested Still eating and drinking plenty of fluids Had epistaxis last night for about 30 minutes, only needed one kleenex. Blood only from R nostril Took day quil and nyquil  Son with similar symptoms Declines testing for flu and COVID as symptoms are so mild  OBJECTIVE:   BP 109/66   Pulse 71   Ht 5\' 6"  (1.676 m)   Wt 181 lb 12.8 oz (82.5 kg)   SpO2 97%   BMI 29.34 kg/m    General: 72 year old female, pleasant, NAD HEENT: White sclera, clear conjunctiva, MMM, no erythema or edema of nasal turbinates, possible nasal polyp in left nostril Cardio: RRR, normal S1/S2 Lungs: CTAB, normal effort Neuro: Alert, cranial nerves II through XII intact, sensation intact, finger-nose-finger test normal Psych: Mood and affect appropriate for situation  ASSESSMENT/PLAN:   Essential hypertension BP on lower end today.  Orthostatics negative.  -Will trial off of amlodipine for 2 weeks -Patient to check blood pressure at home once daily -Return in 2 weeks for blood pressure check  Upper respiratory tract infection Symptoms of sore throat, headache/ sinus pain, rhinorrhea consistent with mild URI.  She is well-hydrated and breathing comfortably with stable vitals.  Discussed with patient that if symptoms persist over the next week/worsen to return and we can treat for sinusitis if indicated.  Of note, did have what appeared to be a polyp in left nostril which  could contribute to possible sinusitis and epistaxis.  Patient declines ENT evaluation at this time. -Discussed supportive care and return precautions    Annual Examination  See AVS for age appropriate recommendations  PHQ score 4, reviewed and discussed.  BP reviewed and at goal .  Asked about intimate partner violence and resources given as appropriate  Advance directives discussion had today - has started preparing, getting things in order, working on a living will.   Considered the following items based upon USPSTF recommendations: Diabetes screening:  A1c 5.5 in 2019    Screening for elevated cholesterol:  done 08/06/22: total cholesterol 106, LDL 10 HIV testing:  neg 2021 Hepatitis C:  neg 2017  Syphilis if at high risk: discussed GC/CT not at high risk and not ordered. Last DEXA was 2022 showing osteopenia. Repeat DEXA ordered today  Reviewed risk factors for latent tuberculosis and not indicated  Cervical cancer screening: not indicated given history of hysterectomy with prior normal cytology.  Breast cancer screening:  Due 07/2024 Colorectal cancer screening: Due 05/2024 Lung cancer screening: Due 08/2023 Vaccinations : flu today. Declines shingrix.   Follow up in 2 weeks or sooner if indicated.    Erick Alley, DO [facility]

## 2022-12-16 ENCOUNTER — Ambulatory Visit (INDEPENDENT_AMBULATORY_CARE_PROVIDER_SITE_OTHER): Payer: Medicare Other | Admitting: Student

## 2022-12-16 ENCOUNTER — Encounter: Payer: Self-pay | Admitting: Student

## 2022-12-16 VITALS — BP 109/66 | HR 71 | Ht 66.0 in | Wt 181.8 lb

## 2022-12-16 DIAGNOSIS — M8580 Other specified disorders of bone density and structure, unspecified site: Secondary | ICD-10-CM

## 2022-12-16 DIAGNOSIS — Z0000 Encounter for general adult medical examination without abnormal findings: Secondary | ICD-10-CM | POA: Diagnosis not present

## 2022-12-16 DIAGNOSIS — M8589 Other specified disorders of bone density and structure, multiple sites: Secondary | ICD-10-CM | POA: Diagnosis not present

## 2022-12-16 DIAGNOSIS — Z23 Encounter for immunization: Secondary | ICD-10-CM

## 2022-12-16 DIAGNOSIS — J069 Acute upper respiratory infection, unspecified: Secondary | ICD-10-CM | POA: Diagnosis not present

## 2022-12-16 DIAGNOSIS — I10 Essential (primary) hypertension: Secondary | ICD-10-CM

## 2022-12-16 DIAGNOSIS — Z13820 Encounter for screening for osteoporosis: Secondary | ICD-10-CM | POA: Diagnosis not present

## 2022-12-16 NOTE — Patient Instructions (Addendum)
It was great to see you! Thank you for allowing me to participate in your care!  I recommend that you always bring your medications to each appointment as this makes it easy to ensure you are on the correct medications and helps [place] not miss when refills are needed.  Our plans for today:  - Call to schedule DEXA scan.  [facility] [phone])  [facility] [address]  [phone] -Return if sick symptoms worsen or don't improve over the next week -Return to discuss advance directives if desired -STOP taking amlodipine. Check blood pressure daily for 2 weeks, bring readings and return in 2 weeks    Take care and seek immediate care sooner if you develop any concerns.   Dr. Erick Alley, DO [facility]

## 2022-12-16 NOTE — Assessment & Plan Note (Signed)
Symptoms of sore throat, headache/ sinus pain, rhinorrhea consistent with mild URI.  She is well-hydrated and breathing comfortably with stable vitals.  Discussed with patient that if symptoms persist over the next week/worsen to return and we can treat for sinusitis if indicated.  Of note, did have what appeared to be a polyp in left nostril which could contribute to possible sinusitis and epistaxis.  Patient declines ENT evaluation at this time. -Discussed supportive care and return precautions

## 2022-12-16 NOTE — Assessment & Plan Note (Signed)
BP on lower end today.  Orthostatics negative.  -Will trial off of amlodipine for 2 weeks -Patient to check blood pressure at home once daily -Return in 2 weeks for blood pressure check

## 2022-12-24 ENCOUNTER — Other Ambulatory Visit: Payer: Self-pay | Admitting: Student

## 2022-12-26 ENCOUNTER — Encounter: Payer: Self-pay | Admitting: Physical Medicine & Rehabilitation

## 2022-12-26 ENCOUNTER — Encounter: Payer: Medicare Other | Attending: Physical Medicine & Rehabilitation | Admitting: Physical Medicine & Rehabilitation

## 2022-12-26 VITALS — BP 118/78 | HR 78 | Temp 98.6°F | Ht 66.0 in | Wt 179.0 lb

## 2022-12-26 DIAGNOSIS — M47816 Spondylosis without myelopathy or radiculopathy, lumbar region: Secondary | ICD-10-CM

## 2022-12-26 MED ORDER — LIDOCAINE HCL 1 % IJ SOLN
5.0000 mL | Freq: Once | INTRAMUSCULAR | Status: AC
Start: 2022-12-26 — End: 2022-12-26
  Administered 2022-12-26: 5 mL

## 2022-12-26 MED ORDER — IOHEXOL 180 MG/ML  SOLN
3.0000 mL | Freq: Once | INTRAMUSCULAR | Status: AC
  Administered 2022-12-26: 3 mL via INTRAVENOUS

## 2022-12-26 MED ORDER — LIDOCAINE HCL (PF) 2 % IJ SOLN
2.0000 mL | Freq: Once | INTRAMUSCULAR | Status: AC
Start: 2022-12-26 — End: 2022-12-26
  Administered 2022-12-26: 2 mL

## 2022-12-26 NOTE — Progress Notes (Signed)
Left Lumbar L3, L4  medial branch blocks and L 5 dorsal ramus injection under fluoroscopic guidance   Indication: Left Lumbar pain which is not relieved by medication management or other conservative care and interfering with self-care and mobility.  Informed consent was obtained after describing risks and benefits of the procedure with the patient, this includes bleeding, bruising, infection, paralysis and medication side effects.  The patient wishes to proceed and has given written consent.  The patient was placed in a prone position.  The lumbar area was marked and prepped with Betadine.  One mL of 1% lidocaine was injected into each of 3 areas into the skin and subcutaneous tissue.  Then a 22-gauge 3.5in spinal needle was inserted targeting the junction of the left S1 superior articular process and sacral ala junction.  Needle was advanced under fluoroscopic guidance.  Bone contact was made. Isovue 200 was injected x 0.5 mL demonstrating no intravascular uptake.  Then a solution containing  2% MPF lidocaine was injected x 0.5 mL.  Then the left L5 superior articular process in transverse process junction was targeted.  Bone contact was made. Isovue 200 was injected x 0.5 mL demonstrating no intravascular uptake.  Then a solution containing 2% MPF lidocaine was injected x 0.5 mL.  Then the left L4 superior articular process in transverse process junction was targeted.  Bone contact was made.  Isovue 200 was injected x 0.5 mL demonstrating no intravascular uptake.  Then a solution containing  2% MPF lidocaine was injected x 0.5 mL.  Patient tolerated procedure well.  Post procedure instructions were given.   100% pain relief pre post

## 2022-12-26 NOTE — Progress Notes (Signed)
PROCEDURE RECORD  Physical Medicine and Rehabilitation   Name: [name] DOB:March 05, 1951 MRN: [id number]  Date:12/26/2022  Physician: Claudette Laws, MD    Nurse/CMA: Mixtli Reno RMA   Allergies:  Allergies  Allergen Reactions   Iodinated Contrast Media Shortness Of Breath and Itching    States she had this reaction connected with a MRI of the cervical spine.   Shellfish Allergy Anaphylaxis    All seafood   Crestor [Rosuvastatin] Other (See Comments)    Anxiety, itching, headaches, myalgias Able to tolerate 5mg  daily   Lipitor [Atorvastatin] Other (See Comments)    Anxiety, itching, headaches, myalgias   Statins Other (See Comments)    Arthralgias (severe) with atorvastatin, mild arthralgias with rosuvastatin but willing to continue taking it   Iodine     Consent Signed: Yes.    Is patient diabetic? No.  CBG today? .  Pregnant: No. LMP: No LMP recorded. Patient has had a hysterectomy. (age [age])  Anticoagulants: no Anti-inflammatory: no Antibiotics: no  Procedure: Left L3-4-5 Medial Branch Block  Position: Prone Start Time: 9:40AM  End Time: 9:47AM  Fluoro Time: 37  RN/CMA Cynthea Zachman RMA  Charlane Westry RMA    Time 9:29 9:51AM    BP 118/78 1105/69    Pulse 78 84    Respirations 16 16    O2 Sat 96 97    S/S 6 6    Pain Level 9/10 0/10     D/C home with Self, [name] & O X 3, D/C instructions reviewed, and sits independently.

## 2022-12-26 NOTE — Patient Instructions (Signed)

## 2022-12-29 ENCOUNTER — Other Ambulatory Visit: Payer: Self-pay

## 2022-12-29 ENCOUNTER — Encounter: Payer: Self-pay | Admitting: Student

## 2022-12-29 ENCOUNTER — Ambulatory Visit (INDEPENDENT_AMBULATORY_CARE_PROVIDER_SITE_OTHER): Payer: Medicare Other | Admitting: Student

## 2022-12-29 VITALS — BP 157/93 | HR 83 | Ht 66.0 in | Wt 178.8 lb

## 2022-12-29 DIAGNOSIS — I10 Essential (primary) hypertension: Secondary | ICD-10-CM | POA: Diagnosis not present

## 2022-12-29 DIAGNOSIS — R519 Headache, unspecified: Secondary | ICD-10-CM

## 2022-12-29 DIAGNOSIS — R748 Abnormal levels of other serum enzymes: Secondary | ICD-10-CM | POA: Insufficient documentation

## 2022-12-29 NOTE — Assessment & Plan Note (Signed)
On chart review, patient had elevated alk phos to 162 in May. -Return in ~a month for CMP,vitamin D and GGT

## 2022-12-29 NOTE — Progress Notes (Signed)
    SUBJECTIVE:   CHIEF COMPLAINT / HPI:   HTN D/C amlodipine on 12/16/2022 d/t soft BP Has been checking BP at home- running mostly 120-130's/ 70-80's States she has had a very stressful afternoon with lots of running around/waiting in lines.   Headache Frontal L sided, feels like tension and pain behind eye. Started this morning when standing in the sun and worsened this afternoon when she was running errands and feeling stressed.   Has taken Nurtec which normally helps with migraines but hasn't helped today.  No chest pain, SOB, changes in vision. Denies nausea or vomiting. Sinus headache noted at previous visit resolved.   PERTINENT  PMH / PSH: HTN, TIA, PVD, flushing, migraines  OBJECTIVE:   BP (!) 157/93   Pulse 83   Ht 5\' 6"  (1.676 m)   Wt 178 lb 12.8 oz (81.1 kg)   SpO2 100%   BMI 28.86 kg/m    General: NAD, pleasant, able to participate in exam\ HEENT: White sclera, clear conjunctiva, EOEMI without pain, no pain with palpation of temples Cardiac: RRR, no murmurs. Respiratory: CTAB, normal effort, No wheezes, rales or rhonchi Abdomen: Bowel sounds present, nontender, nondistended, no hepatosplenomegaly. Extremities: no edema or cyanosis. Skin: warm and dry, facial flushing present Neuro: alert, cranial nerves II through XII intact, sensation intact, finger-nose-finger test normal Psych: Normal affect and mood  ASSESSMENT/PLAN:   Essential hypertension BP uncontrolled in office today off of amlodipine however home readings were at goal.   -Patient to schedule RN visit only for BP check, she will bring her home cuff to ensure it is accurate -If concern for accuracy of home cuff, will schedule with Dr. Raymondo Band for ambulatory blood pressure monitoring  Acute intractable headache Headache not relieved with Nurtec she takes for migraines however this headache feels different than her typical migraines.  No red flag symptoms, neuroexam reassuring. -Try Tylenol in addition  to Nurtec -ED/return precautions discussed  Elevated serum alkaline phosphatase level On chart review, patient had elevated alk phos to 162 in May. -Return in ~a month for CMP,vitamin D and GGT      Dr. Erick Alley, DO [facility]

## 2022-12-29 NOTE — Assessment & Plan Note (Signed)
BP uncontrolled in office today off of amlodipine however home readings were at goal.   -Patient to schedule RN visit only for BP check, she will bring her home cuff to ensure it is accurate -If concern for accuracy of home cuff, will schedule with Dr. Raymondo Band for ambulatory blood pressure monitoring

## 2022-12-29 NOTE — Patient Instructions (Addendum)
It was great to see you! Thank you for allowing me to participate in your care!  I recommend that you always bring your medications to each appointment as this makes it easy to ensure you are on the correct medications and helps [place] not miss when refills are needed.  Our plans for today:  -Try Tylenol for your headache, can take up to 1000 mg every 8 hours as needed -Go home and rest if you are able -If symptoms do not improve or worsen, return or go to the ED especially if you have changes in vision, shortness of breath, dizziness/lightheadedness -I recommend scheduling appointment Dr. Raymondo Band on your way out for ambulatory blood pressure monitoring -follow up in 1 month for labs    Take care and seek immediate care sooner if you develop any concerns.   Dr. Erick Alley, DO [facility]

## 2022-12-29 NOTE — Assessment & Plan Note (Signed)
Headache not relieved with Nurtec she takes for migraines however this headache feels different than her typical migraines.  No red flag symptoms, neuroexam reassuring. -Try Tylenol in addition to Nurtec -ED/return precautions discussed

## 2022-12-30 ENCOUNTER — Ambulatory Visit: Payer: Medicare Other

## 2023-01-07 ENCOUNTER — Other Ambulatory Visit: Payer: Self-pay | Admitting: Student

## 2023-01-07 DIAGNOSIS — I10 Essential (primary) hypertension: Secondary | ICD-10-CM

## 2023-01-26 DIAGNOSIS — R3129 Other microscopic hematuria: Secondary | ICD-10-CM | POA: Diagnosis not present

## 2023-01-26 DIAGNOSIS — D631 Anemia in chronic kidney disease: Secondary | ICD-10-CM | POA: Diagnosis not present

## 2023-01-26 DIAGNOSIS — N1831 Chronic kidney disease, stage 3a: Secondary | ICD-10-CM | POA: Diagnosis not present

## 2023-01-26 DIAGNOSIS — N2581 Secondary hyperparathyroidism of renal origin: Secondary | ICD-10-CM | POA: Diagnosis not present

## 2023-01-26 DIAGNOSIS — I5032 Chronic diastolic (congestive) heart failure: Secondary | ICD-10-CM | POA: Diagnosis not present

## 2023-01-26 DIAGNOSIS — I129 Hypertensive chronic kidney disease with stage 1 through stage 4 chronic kidney disease, or unspecified chronic kidney disease: Secondary | ICD-10-CM | POA: Diagnosis not present

## 2023-01-27 LAB — LAB REPORT - SCANNED
Creatinine, POC: 7.6 mg/dL
EGFR: 53

## 2023-01-27 NOTE — Progress Notes (Unsigned)
  PROCEDURE RECORD [place] Physical Medicine and Rehabilitation   Name: [name] DOB:05/17/1950 MRN: [id number]  Date:01/27/2023  Physician: Claudette Laws, MD    Nurse/CMA: Courtney Carwin MA  Allergies:  Allergies  Allergen Reactions   Iodinated Contrast Media Shortness Of Breath and Itching    States she had this reaction connected with a MRI of the cervical spine.   Shellfish Allergy Anaphylaxis    All seafood   Crestor [Rosuvastatin] Other (See Comments)    Anxiety, itching, headaches, myalgias Able to tolerate 5mg  daily   Lipitor [Atorvastatin] Other (See Comments)    Anxiety, itching, headaches, myalgias   Statins Other (See Comments)    Arthralgias (severe) with atorvastatin, mild arthralgias with rosuvastatin but willing to continue taking it   Iodine     Consent Signed: {yes [id number]}  Is patient diabetic? {yes no:314532}  CBG today? ***  Pregnant: {yes no:314532} LMP: No LMP recorded. Patient has had a hysterectomy. (age [age])  Anticoagulants: {Yes/No:19989} Anti-inflammatory: {Yes/No:19989} Antibiotics: {Yes/No:19989}  Procedure: ***  Position: Prone Start Time: ***  End Time: ***  Fluoro Time: ***  RN/CMA Alishba Naples MA     Time      BP      Pulse      Respirations      O2 Sat      S/S      Pain Level       D/C home with ***, [name] & O X 3, D/C instructions reviewed, and sits independently.

## 2023-01-29 ENCOUNTER — Encounter: Payer: Medicare Other | Attending: Physical Medicine & Rehabilitation | Admitting: Physical Medicine & Rehabilitation

## 2023-01-29 ENCOUNTER — Encounter: Payer: Self-pay | Admitting: Physical Medicine & Rehabilitation

## 2023-01-29 VITALS — BP 98/64 | HR 66 | Temp 98.2°F | Ht 66.0 in | Wt 183.0 lb

## 2023-01-29 DIAGNOSIS — M47816 Spondylosis without myelopathy or radiculopathy, lumbar region: Secondary | ICD-10-CM | POA: Diagnosis not present

## 2023-01-29 MED ORDER — LIDOCAINE HCL (PF) 2 % IJ SOLN
4.0000 mL | Freq: Once | INTRAMUSCULAR | Status: AC
Start: 2023-01-29 — End: 2023-01-29
  Administered 2023-01-29: 4 mL

## 2023-01-29 MED ORDER — LIDOCAINE HCL 1 % IJ SOLN
5.0000 mL | Freq: Once | INTRAMUSCULAR | Status: AC
Start: 2023-01-29 — End: 2023-01-29
  Administered 2023-01-29: 5 mL

## 2023-01-29 NOTE — Patient Instructions (Signed)
Lumbar medial branch blocks were performed. This is to help diagnose the cause of the low back pain. It is important that you keep track of your pain for the first day or 2 after injection. This injection can give you temporary relief that lasts for hours or up to several months. There is no way to predict duration of pain relief.  Please try to compare your pain after injection to for the injection.  If this injection gives you  temporary relief there may be another longer-lasting procedure that may be beneficial call radiofrequency ablation 

## 2023-01-29 NOTE — Progress Notes (Signed)
Right lumbar L3, L4 medial branch blocks and L5 dorsal ramus injection under fluoroscopic guidance  Indication: Right Lumbar pain which is not relieved by medication management or other conservative care and interfering with self-care and mobility.  Informed consent was obtained after describing risks and benefits of the procedure with the patient, this includes bleeding, bruising, infection, paralysis and medication side effects. The patient wishes to proceed and has given written consent. The patient was placed in a prone position. The lumbar area was marked and prepped with Betadine. One ML of 1% lidocaine was injected into each of 3 areas into the skin and subcutaneous tissue. Then a 22-gauge 3.5in spinal needle was inserted targeting the junction of the Right S1 superior articular process and sacral ala junction. Needle was advanced under fluoroscopic guidance. Bone contact was made.. Then a solution containing 2% MPF lidocaine was injected x0.5 mL. Then the Right L5 superior articular process in transverse process junction was targeted.. Then the Right L4 superior articular process in transverse process junction was targeted. Lateral views confirmed needle placement Bone contact was made.  Patient tolerated procedure well. Post procedure instructions were given. Please refer to post procedure form.

## 2023-01-30 ENCOUNTER — Ambulatory Visit: Payer: Medicare Other | Admitting: Physical Medicine & Rehabilitation

## 2023-02-02 ENCOUNTER — Other Ambulatory Visit: Payer: Self-pay | Admitting: Student

## 2023-02-02 NOTE — Progress Notes (Unsigned)
    SUBJECTIVE:   CHIEF COMPLAINT / HPI:   Headache Located at posterior neck and occiput. Eases up significantly with tylenol 1000 mg every 6 hours as needed. Same headache she has had for years. Still taking the nurtec prescribed by neurologist.  Cannot take NSAIDs due to CKD 3a  PERTINENT  PMH / PSH: Migraines, cervical myelopathy  OBJECTIVE:   BP 134/79   Pulse 99   Ht 5\' 6"  (1.676 m)   Wt 179 lb 6.4 oz (81.4 kg)   SpO2 96%   BMI 28.96 kg/m    General: NAD, pleasant, able to participate in exam Cardiac: RRR, no murmurs. Respiratory: CTAB, normal effort, No wheezes, rales or rhonchi Abdomen: Bowel sounds present, nontender, nondistended, no hepatosplenomegaly. Extremities: no edema or cyanosis. Skin: warm and dry, no rashes noted Neuro: alert, no obvious focal deficits Psych: Normal affect and mood  ASSESSMENT/PLAN:   Elevated serum alkaline phosphatase level Elevated 162 in May.  S/p cholecystectomy -Vitamin D level -CMP -GGT  Cervicogenic headache Chronic.  Reassuringly gets better with Tylenol.  Provided occipital inhibition OMT in office today which also provided some relief, discussed she can do this at home with a tennis balls/water bottle.  -Continue Tylenol as needed -Demonstrated cervical stretches to do daily -Return as needed    Dr. Erick Alley, DO [facility]

## 2023-02-03 ENCOUNTER — Encounter: Payer: Self-pay | Admitting: Student

## 2023-02-03 ENCOUNTER — Ambulatory Visit (INDEPENDENT_AMBULATORY_CARE_PROVIDER_SITE_OTHER): Payer: Medicare Other | Admitting: Student

## 2023-02-03 VITALS — BP 134/79 | HR 99 | Ht 66.0 in | Wt 179.4 lb

## 2023-02-03 DIAGNOSIS — R748 Abnormal levels of other serum enzymes: Secondary | ICD-10-CM | POA: Diagnosis not present

## 2023-02-03 DIAGNOSIS — R5383 Other fatigue: Secondary | ICD-10-CM

## 2023-02-03 DIAGNOSIS — G4486 Cervicogenic headache: Secondary | ICD-10-CM | POA: Diagnosis not present

## 2023-02-03 DIAGNOSIS — N1831 Chronic kidney disease, stage 3a: Secondary | ICD-10-CM | POA: Diagnosis not present

## 2023-02-03 NOTE — Patient Instructions (Addendum)
It was great to see you! Thank you for allowing me to participate in your care!  I recommend that you always bring your medications to each appointment as this makes it easy to ensure you are on the correct medications and helps [place] not miss when refills are needed.  Our plans for today:  - do neck stretches daily -tylenol as needed, 1000 mg every 6 hours is okay - return based on labs. Otherwise return as needed   We are checking some labs today, I will call you if they are abnormal will send you a MyChart message or a letter if they are normal.  If you do not hear about your labs in the next 2 weeks please let us know.  Take care and seek immediate care sooner if you develop any concerns.   Dr. Erick Alley, DO [facility]

## 2023-02-04 ENCOUNTER — Other Ambulatory Visit: Payer: Self-pay | Admitting: Student

## 2023-02-04 ENCOUNTER — Telehealth: Payer: Self-pay | Admitting: Student

## 2023-02-04 DIAGNOSIS — G4486 Cervicogenic headache: Secondary | ICD-10-CM | POA: Insufficient documentation

## 2023-02-04 DIAGNOSIS — R7989 Other specified abnormal findings of blood chemistry: Secondary | ICD-10-CM

## 2023-02-04 LAB — COMPREHENSIVE METABOLIC PANEL
ALT: 20 [IU]/L (ref 0–32)
AST: 26 [IU]/L (ref 0–40)
Albumin: 4.8 g/dL (ref 3.8–4.8)
Alkaline Phosphatase: 134 [IU]/L — ABNORMAL HIGH (ref 44–121)
BUN/Creatinine Ratio: 9 — ABNORMAL LOW (ref 12–28)
BUN: 14 mg/dL (ref 8–27)
Bilirubin Total: 0.3 mg/dL (ref 0.0–1.2)
CO2: 23 mmol/L (ref 20–29)
Calcium: 9.3 mg/dL (ref 8.7–10.3)
Chloride: 101 mmol/L (ref 96–106)
Creatinine, Ser: 1.63 mg/dL — ABNORMAL HIGH (ref 0.57–1.00)
Globulin, Total: 1.8 g/dL (ref 1.5–4.5)
Glucose: 109 mg/dL — ABNORMAL HIGH (ref 70–99)
Potassium: 3.9 mmol/L (ref 3.5–5.2)
Sodium: 142 mmol/L (ref 134–144)
Total Protein: 6.6 g/dL (ref 6.0–8.5)
eGFR: 34 mL/min/{1.73_m2} — ABNORMAL LOW (ref >59)

## 2023-02-04 LAB — VITAMIN D 25 HYDROXY (VIT D DEFICIENCY, FRACTURES): Vit D, 25-Hydroxy: 54.3 ng/mL (ref 30.0–100.0)

## 2023-02-04 LAB — GAMMA GT: GGT: 21 [IU]/L (ref 0–60)

## 2023-02-04 NOTE — Assessment & Plan Note (Signed)
Elevated 162 in May.  S/p cholecystectomy -Vitamin D level -CMP -GGT

## 2023-02-04 NOTE — Assessment & Plan Note (Addendum)
Chronic.  Reassuringly gets better with Tylenol.  Provided occipital inhibition OMT in office today which also provided some relief, discussed she can do this at home with a tennis balls/water bottle.  -Continue Tylenol as needed -Demonstrated cervical stretches to do daily -Return as needed

## 2023-02-04 NOTE — Telephone Encounter (Signed)
Attempted to call pt with recent lab results. LVM letting her know I will send mychart message and she can also call clinic back if she wants to discuss over the phone.

## 2023-02-04 NOTE — Progress Notes (Signed)
Left MyChart message that I recommend patient come in to have creatinine rechecked next week as it has increased significantly at some point in the last 6 months.  Future order placed.

## 2023-02-16 ENCOUNTER — Other Ambulatory Visit: Payer: Medicare Other

## 2023-02-16 DIAGNOSIS — R7989 Other specified abnormal findings of blood chemistry: Secondary | ICD-10-CM

## 2023-02-17 ENCOUNTER — Ambulatory Visit: Payer: Medicare Other

## 2023-02-17 VITALS — BP 110/70 | HR 73 | Temp 97.5°F | Resp 20 | Ht 66.0 in | Wt 184.8 lb

## 2023-02-17 DIAGNOSIS — I739 Peripheral vascular disease, unspecified: Secondary | ICD-10-CM | POA: Diagnosis not present

## 2023-02-17 DIAGNOSIS — G459 Transient cerebral ischemic attack, unspecified: Secondary | ICD-10-CM | POA: Diagnosis not present

## 2023-02-17 DIAGNOSIS — E785 Hyperlipidemia, unspecified: Secondary | ICD-10-CM

## 2023-02-17 LAB — BASIC METABOLIC PANEL
BUN/Creatinine Ratio: 11 — ABNORMAL LOW (ref 12–28)
BUN: 14 mg/dL (ref 8–27)
CO2: 25 mmol/L (ref 20–29)
Calcium: 8.6 mg/dL — ABNORMAL LOW (ref 8.7–10.3)
Chloride: 103 mmol/L (ref 96–106)
Creatinine, Ser: 1.33 mg/dL — ABNORMAL HIGH (ref 0.57–1.00)
Glucose: 143 mg/dL — ABNORMAL HIGH (ref 70–99)
Potassium: 3.6 mmol/L (ref 3.5–5.2)
Sodium: 143 mmol/L (ref 134–144)
eGFR: 43 mL/min/{1.73_m2} — ABNORMAL LOW (ref >59)

## 2023-02-17 MED ORDER — INCLISIRAN SODIUM 284 MG/1.5ML ~~LOC~~ SOSY
284.0000 mg | PREFILLED_SYRINGE | Freq: Once | SUBCUTANEOUS | Status: AC
  Administered 2023-02-17: 284 mg via SUBCUTANEOUS
  Filled 2023-02-17: qty 1.5

## 2023-02-17 NOTE — Progress Notes (Signed)
Diagnosis: Hyperlipidemia  Provider:  Chilton Greathouse MD  Procedure: Injection  Leqvio (inclisiran), Dose: 284 mg, Site: subcutaneous, Number of injections: 1  Injection Site(s): Right arm   Discharge: Condition: Good, Destination: Home . AVS Declined  Performed by:  Nat Math, RN

## 2023-02-19 ENCOUNTER — Encounter: Payer: Self-pay | Admitting: Student

## 2023-03-12 ENCOUNTER — Other Ambulatory Visit: Payer: Self-pay | Admitting: Family Medicine

## 2023-03-16 NOTE — Progress Notes (Deleted)
 PATIENT: [name] DOB: 02/29/1951  REASON FOR VISIT: follow up HISTORY FROM: patient  No chief complaint on file.    HISTORY OF PRESENT ILLNESS:  03/18/2023 ALL: [name] returns for follow up for migraines and insomnia. She was last seen 09/2022 and doing fairly well on zonisamide  100mg  BID with extra 25mg  dose as needed and doxepine 100mg  at bedtime. She reported skipping doxepin  on some nights and continued trazodone  25-50mg  PRN per PCP. Since,   09/23/2022 ALL: [name] returns for follow up for migraines, hx of OSA previously on CPAP and insomnia. She was last seen by Duwaine Russell 11/2021 and doing failry well on CPAP. She was not happy with mask. I saw her last 10/2021 and advised need to wean dose of doxepin  as she was on higher doses and also taking trazodone . She lost her husband in February.   Since, she reports no significant changes in headaches. Nurtec does help. She continues zonisamide  100mg  BID. May take extra 25mg  tablet 1-2 times a week. She has weaned doxepin  over the past few months. Now taking 100mg  most nights. She tries to skip nights when she can. She does continue trazodone  50mg  at bedtime per PCP. Also taking sertraline . She feels mood is stable. She was unable to tolerate CPAP and reports returning machine. She is interested in speaking to dentist regarding oral appliance.   10/14/2021 ALL: [name] returns for follow up for migraines and recently diagnosed OSA. She was last seen 06/2021 with worsening headaches as she had been off medications for 2 weeks. She resumed zonisamide  100mg  BID with extra 25mg  dose as needed. She also takes doxepin  200mg  at bedtime for insomnia. PCP also writes 25-50mg  trazodone  for insomnia but she has not started this medication. Migraines are somewhat improved. She reports having 2 migraines a week. Nurtec does help with abortive therapy.   HST showed moderate OSA with total AHI of 27.6/hr and O2 nadir of 86%. AutoPAP ordered. She reports  that she has been in contact with [facility] but not yet received her machine.    She is requesting to be excused from jury duty due tot he severity of her migraines. Juror #[id number]  06/27/2021 ALL: [name] returns for follow up for migraines. She continues zonisamide  100mg  BID with extra 25mg  dose as needed. She also takes doxepin  200mg  at bedtime for insomnia. PCP also writes 25-50mg  trazodone  for insomnia. She was seen in consult with Dr Buck 05/2021 and advised against taking two sleep aids. HST ordered. She was encouraged to decrease caffeine intake.   She has been off zonisamide  for 2 weeks. She reports headaches are significantly worse. She has had a bad headache for the past few days. She was able to pick up all meds yesterday and restarted last night. Nurtec She reports that she does not drink caffeine. She endorses significant stress. She is caring for her brother and husband who are both ill. She is not sleeping well despite taking doxepin  200mg  and trazodone  25-50mg  (from PCP). She wakes multiple times at night. She has not scheduled sleep study due to being limited on time. She reports BP has been elevated this week. She feels that it is elevated due to headaches from being off medications.   02/26/2021 ALL:  [name] returns with concerns of worsening headaches. She continues zonisamide  100mg  BID with extra 25mg  dose daily as needed. She reports worsening over the past 1-2 months. Headaches are more frequent (near daily) and more intense. Similar symptoms has previous headaches.  No new symptoms. Most are present in the morning. Can worsen during the day. She has had an intractable migraine for the past 3 days. Tizanidine  and Tylenol  usually abort migraines but she has not gotten much relief. She continues doxepin  200mg  QHS for insomnia managed with our office. Trazodone  25-50 prescribed with PCP. She has interrupted sleep. She does snore. She wakes multiple times at night. She does not wake refreshed.  She does have dry mouth. Brother has sleep apnea on CPAP.   Medications tried and failed: Ajovy  (ineffective), topiramate , zonisamide  (on now), trazodone  (on now), sertraline  (on now), gabapentin  (on now), propranolol , Nurtec, tramadol  (takes at night), tizanidine , ondansetron , cyclobenzaprine , metoclopramide , triptans contraindicated due to PVD, CVA and TIA history   10/10/2020 ALL: [name] returns for follow up for migraines and insomnia. She is doing well. She continues zonisamide  100mg  BID and doxepin  200mg  at bedtime. She has had several stressors since we last saw her. Her brother had to have emergency surgery so she has had to help care for him and his hair salon.  She is seeing Dr Burnetta, [facility], and Dr Carilyn, [facility] med, for chronic neck and back pain. Dr Carilyn has her on gabapentin  600mg  twice daily that helps some. Dr Sharl for broken shoulder and Dr Steffani for chronic knee pain.   Headaches are occurring most days. She feels that she has pain starting in her neck that radiates to the top of her head everyday around 3pm. She has not tried taking extra 25mg  dose of imipramine . She is sleeping well. She is tolerating doxepin .   10/10/2019 ALL:  [name] is a 73 y.o. female here today for follow up for headaches. She continues zonisamide  100mg  daily. She has a 25mg  dose that she rarely takes. She has daily headaches, mostly in the mornings, that are usually easily treatable. She does note that weather changes are a trigger for her. She is able to abort headaches with rest. She may have 1-2 migraines per month.   She continues doxepin  200mg  at night for insomnia. She has been on this medication for years. She reports that she has a difficult time sleeping. She struggles to go to sleep at night and to stay asleep. She does wake frequently at night to use the restroom. She wakes with a dry mouth. She is not sure if she snores. She has a brother who uses CPAP therapy.   HISTORY:  (copied from my note on 12/09/2018)  [name] is a 73 y.o. female here today for follow up for migraines. She started Ajovy  about a year ago. She felt that it helped initially but after a few months it did not seem to help. She has daily dull headaches, usually behind the right eye. About 1-2 times a month she has more severe headache with light, sound sensitivity and nausea. She continues zonisamide  100mg  and tizanidine  2mg  daily. She takes gabapentin  600mg  1-2 times daily for headaches and dysesthesias in her left side. Doxepin  helps with sleep. She is followed closely by PCP for BP and cholesterol management. She is intolerant to statins, now on Repatha . On Plavix .    HISTORY: (copied from Megan Millikan's note on 03/06/2018)   [name] is a 73 year old female with a history of intractable migraine headaches.  She returns today for follow-up.  Overall she feels that she has done relatively well.  He continues to have a daily dull headache that is in the right temporal region.  However she states that  she has not had a severe headache in the last 6 months.  She reports she does have photophobia with her headaches.  She states that if she misses her medication then her headache will worsen.  She reports that she was unable to obtain Aimovig.  She continues on Zonegran  and tizanidine .   HISTORY 07/01/17 [name] is a 73 year old female with a history of intractable migraine headaches.  She returns today for follow-up.  She reports that she continues to have a daily dull headache.  She remains on Zonegran  and tizanidine .  She states that she did receive a letter about Aimovig but was unable to follow-up on this due to her brother being in the hospital.  She states that she only has approximately one severe migraine a month.  Her headaches seem to occur on the right side starting at the neck radiating to the eyes.  She does have photophobia, phonophobia and nausea.  She continues to take doxepin  at  night.  She returns today for an evaluation.    REVIEW OF SYSTEMS: Out of a complete 14 system review of symptoms, the patient complains only of the following symptoms, morning headaches, dry mouth, fatigue, chronic pain, insomnia and all other reviewed systems are negative.  ALLERGIES: Allergies  Allergen Reactions   Iodinated Contrast Media Shortness Of Breath and Itching    States she had this reaction connected with a MRI of the cervical spine.   Shellfish Allergy Anaphylaxis    All seafood   Crestor  [Rosuvastatin ] Other (See Comments)    Anxiety, itching, headaches, myalgias Able to tolerate 5mg  daily   Lipitor [Atorvastatin ] Other (See Comments)    Anxiety, itching, headaches, myalgias   Statins Other (See Comments)    Arthralgias (severe) with atorvastatin , mild arthralgias with rosuvastatin  but willing to continue taking it   Iodine     HOME MEDICATIONS: Outpatient Medications Prior to Visit  Medication Sig Dispense Refill   albuterol  (VENTOLIN  HFA) 108 (90 Base) MCG/ACT inhaler Inhale 2 puffs into the lungs every 6 (six) hours as needed for wheezing or shortness of breath. 8 g 6   amLODipine  (NORVASC ) 5 MG tablet TAKE 1 TABLET (5 MG TOTAL) BY MOUTH DAILY. 90 tablet 3   AREXVY 120 MCG/0.5ML injection      cetirizine  (ZYRTEC ) 10 MG tablet Take 1 tablet (10 mg total) by mouth daily with breakfast. 30 tablet 0   clopidogrel  (PLAVIX ) 75 MG tablet TAKE 1 TABLET BY MOUTH EVERY DAY 90 tablet 3   doxepin  (SINEQUAN ) 100 MG capsule Take 1 capsule (100 mg total) by mouth at bedtime. 90 capsule 1   fluticasone  (FLONASE ) 50 MCG/ACT nasal spray SPRAY 1 SPRAY INTO BOTH NOSTRILS DAILY. 16 mL 2   furosemide  (LASIX ) 40 MG tablet Take 1 tablet (40 mg total) by mouth daily. 30 tablet 2   gabapentin  (NEURONTIN ) 600 MG tablet Take 1 tablet (600 mg total) by mouth 3 (three) times daily. 90 tablet 3   inclisiran (LEQVIO ) 284 MG/1.5ML SOSY injection Inject as directed 1.5 mL 0   losartan  (COZAAR )  25 MG tablet TAKE 1 TABLET BY MOUTH EVERYDAY AT BEDTIME 90 tablet 0   ondansetron  (ZOFRAN ) 4 MG tablet Take 1 tablet (4 mg total) by mouth every 8 (eight) hours as needed for nausea or vomiting. 20 tablet 0   pantoprazole  (PROTONIX ) 40 MG tablet TAKE 1 TABLET(40 MG) BY MOUTH TWICE DAILY 180 tablet 1   Rimegepant Sulfate (NURTEC) 75 MG TBDP Take 1 tablet (75 mg total)  by mouth daily as needed (take for abortive therapy of migraine, no more than 1 tablet in 24 hours or 10 per month). 8 tablet 11   rosuvastatin  (CRESTOR ) 5 MG tablet TAKE 1 TABLET (5 MG TOTAL) BY MOUTH DAILY. 90 tablet 3   sertraline  (ZOLOFT ) 100 MG tablet Take 1 tablet (100 mg total) by mouth daily. 90 tablet 3   tiZANidine  (ZANAFLEX ) 4 MG tablet Take 1 tablet (4 mg total) by mouth every 6 (six) hours as needed for muscle spasms. 180 tablet 1   traMADol  (ULTRAM ) 50 MG tablet TAKE 1 TABLET EVERY 8 HOURS AS NEEDED FOR MODERATE PAIN 90 tablet 5   traZODone  (DESYREL ) 50 MG tablet TAKE 1/2 TO 1 TABLET BY MOUTH AT BEDTIME AS NEEDED FOR SLEEP 90 tablet 0   zonisamide  (ZONEGRAN ) 100 MG capsule Take 1 capsule (100 mg total) by mouth 2 (two) times daily. 180 capsule 3   zonisamide  (ZONEGRAN ) 25 MG capsule take 25 mg twice daily prn in addition to 100 mg twice daily. 180 capsule 0   No facility-administered medications prior to visit.    PAST MEDICAL HISTORY: Past Medical History:  Diagnosis Date   Anemia    Cervical neuritis    Chronic kidney disease    stage 3   Dyslipidemia    GERD (gastroesophageal reflux disease)    History of transient ischemic attack (TIA)    HTN (hypertension)    Hyperlipidemia    Migraine    Migraine headache    Occipital neuralgia    Left   Pulmonary embolism (HCC) 2017   Renal insufficiency    Stroke (HCC) 10/2013    PAST SURGICAL HISTORY: Past Surgical History:  Procedure Laterality Date   ABDOMINAL HYSTERECTOMY     ANTERIOR CERVICAL DECOMP/DISCECTOMY FUSION N/A 05/24/2020   Procedure: ANTERIOR  CERVICAL DECOMPRESSION/DISCECTOMY FUSION 1 LEVEL C3-4;  Surgeon: Burnetta Aures, MD;  Location: [facility] OR;  Service: Orthopedics;  Laterality: N/A;  3 hrs   BILIARY STENT PLACEMENT N/A 12/12/2015   Procedure: BILIARY STENT PLACEMENT;  Surgeon: Victory LITTIE Legrand DOUGLAS, MD;  Location: [facility] ENDOSCOPY;  Service: Endoscopy;  Laterality: N/A;   BLADDER REPAIR     CARPAL TUNNEL RELEASE     CERVICAL FUSION     CERVICAL SPINE SURGERY     CHOLECYSTECTOMY N/A 12/06/2015   Procedure: LAPAROSCOPIC CHOLECYSTECTOMY WITH  INTRAOPERATIVE CHOLANGIOGRAM;  Surgeon: Jina Nephew, MD;  Location: [facility] OR;  Service: General;  Laterality: N/A;   ELBOW SURGERY     ERCP N/A 12/12/2015   Procedure: ENDOSCOPIC RETROGRADE CHOLANGIOPANCREATOGRAPHY (ERCP);  Surgeon: Victory LITTIE Legrand DOUGLAS, MD;  Location: [facility] ENDOSCOPY;  Service: Endoscopy;  Laterality: N/A;   ESOPHAGOGASTRODUODENOSCOPY N/A 01/28/2016   Procedure: ESOPHAGOGASTRODUODENOSCOPY (EGD) Biliary STENT removal;  Surgeon: Victory LITTIE Legrand DOUGLAS, MD;  Location: [facility] ENDOSCOPY;  Service: Gastroenterology;  Laterality: N/A;   IR GENERIC HISTORICAL  12/17/2015   IR US  GUIDE BX ASP/DRAIN 12/17/2015 [facility]   IR GENERIC HISTORICAL  12/17/2015   IR GUIDED DRAIN W CATHETER PLACEMENT 12/17/2015 [facility]   IR GENERIC HISTORICAL  12/17/2015   IR SINUS/FIST TUBE CHK-NON GI 12/17/2015 [facility]   KNEE SURGERY     LUNG SURGERY     TEE WITHOUT CARDIOVERSION N/A 12/19/2015   Procedure: TRANSESOPHAGEAL ECHOCARDIOGRAM (TEE);  Surgeon: Maude JAYSON Emmer, MD;  Location[facility] ENDOSCOPY;  Service: Cardiovascular;  Laterality: N/A;    FAMILY HISTORY: Family History  Problem Relation Age of Onset   Cancer Mother 31  breast   Alzheimer's disease Mother    Prostate cancer Father    Lung cancer Father    Cancer Brother    Hyperlipidemia Brother    Sleep apnea Brother    Cancer Brother    Hyperlipidemia Brother    Dementia Brother        frontotemporal dementia   Dementia Maternal Grandmother     Cancer Maternal Grandfather     SOCIAL HISTORY: Social History   Socioeconomic History   Marital status: Married    Spouse name: [name]   Number of children: 1   Years of education: 12+   Highest education level: Tax Adviser degree: occupational, scientist, product/process development, or vocational program  Occupational History   Occupation: ACTIVITY DIRECTOR    Employer: [employer]   Occupation: CNA   Occupation: Retired  Tobacco Use   Smoking status: Former    Current packs/day: 0.00    Average packs/day: 1.5 packs/day for 42.6 years (64.0 ttl pk-yrs)    Types: Cigarettes    Start date: 03/10/1969    Quit date: 11/01/2011    Years since quitting: 11.3   Smokeless tobacco: Never   Tobacco comments:    Smoked 1.5-2 ppd for adult life (salem menthols)  Vaping Use   Vaping status: Never Used  Substance and Sexual Activity   Alcohol use: No    Alcohol/week: 0.0 standard drinks of alcohol   Drug use: No   Sexual activity: Yes    Partners: Male    Birth control/protection: Surgical    Comment: 1 sexual partner in last 12 months: married  Other Topics Concern   Not on file  Social History Narrative    Patient is married ([name]).   Patient has one child and grandchild. Her sons name is Alm. Patient sees them often.   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 Patient spends 6-10 hours with her 6 days a week.    Patient has a college education.   Patient is very active in her church.    Hobbies- outside crafts and church choir.   Social Drivers of Health   Financial Resource Strain: Medium Risk (12/27/2022)   Overall Financial Resource Strain (CARDIA)    Difficulty of Paying Living Expenses: Somewhat hard  Food Insecurity: Food Insecurity Present (12/27/2022)   Hunger Vital Sign    Worried About Running Out of Food in the Last Year: Sometimes true    Ran Out of Food in the Last Year: Sometimes true  Transportation Needs: No Transportation Needs  (12/27/2022)   PRAPARE - Administrator, Civil Service (Medical): No    Lack of Transportation (Non-Medical): No  Physical Activity: Insufficiently Active (12/27/2022)   Exercise Vital Sign    Days of Exercise per Week: 4 days    Minutes of Exercise per Session: 30 min  Stress: Stress Concern Present (12/27/2022)   [employer] of Occupational Health - Occupational Stress Questionnaire    Feeling of Stress : To some extent  Social Connections: Moderately Integrated (12/27/2022)   Social Connection and Isolation Panel [NHANES]    Frequency of Communication with Friends and Family: More than three times a week    Frequency of Social Gatherings with Friends and Family: More than three times a week    Attends Religious Services: More than 4 times per year    Active Member of [employer] or Organizations: Yes    Attends Banker Meetings: More than  4 times per year    Marital Status: Widowed  Intimate Partner Violence: Not At Risk (06/19/2022)   Humiliation, Afraid, Rape, and Kick questionnaire    Fear of Current or Ex-Partner: No    Emotionally Abused: No    Physically Abused: No    Sexually Abused: No      PHYSICAL EXAM  There were no vitals filed for this visit.       There is no height or weight on file to calculate BMI.  Generalized: Well developed, in no acute distress  Cardiology: normal rate and rhythm, no murmur noted Respiratory: clear to auscultation bilaterally  Neurological examination  Mentation: Alert oriented to time, place, history taking. Follows all commands speech and language fluent Cranial nerve II-XII: Pupils were equal round reactive to light. Extraocular movements were full, visual field were full on confrontational test. Facial sensation and strength were normal. Head turning and shoulder shrug  were normal and symmetric. Motor: The motor testing reveals 5 over 5 strength of all 4 extremities. Good symmetric motor tone is noted  throughout.  Sensory: Sensory testing is intact to soft touch on all 4 extremities. No evidence of extinction is noted.   Gait and station: Gait is stable without assistive device    DIAGNOSTIC DATA (LABS, IMAGING, TESTING) - I reviewed patient records, labs, notes, testing and imaging myself where available.      No data to display           Lab Results  Component Value Date   WBC 7.6 08/05/2022   HGB 12.9 08/05/2022   HCT 41.6 08/05/2022   MCV 86 08/05/2022   PLT 270 08/05/2022      Component Value Date/Time   NA 143 02/16/2023 1633   K 3.6 02/16/2023 1633   CL 103 02/16/2023 1633   CO2 25 02/16/2023 1633   GLUCOSE 143 (H) 02/16/2023 1633   GLUCOSE 135 (H) 08/29/2019 1030   BUN 14 02/16/2023 1633   CREATININE 1.33 (H) 02/16/2023 1633   CREATININE 1.10 (H) 02/25/2016 0907   CALCIUM  8.6 (L) 02/16/2023 1633   PROT 6.6 02/03/2023 1636   ALBUMIN  4.8 02/03/2023 1636   AST 26 02/03/2023 1636   ALT 20 02/03/2023 1636   ALKPHOS 134 (H) 02/03/2023 1636   BILITOT 0.3 02/03/2023 1636   GFRNONAA >60 08/29/2019 1030   GFRNONAA 53 (L) 02/25/2016 0907   GFRAA >60 08/29/2019 1030   GFRAA 61 02/25/2016 0907   Lab Results  Component Value Date   CHOL 106 08/06/2022   HDL 82 08/06/2022   LDLCALC 10 08/06/2022   LDLDIRECT 156 (H) 03/27/2017   TRIG 66 08/06/2022   CHOLHDL 1.3 08/06/2022   Lab Results  Component Value Date   HGBA1C 5.5 07/30/2017   Lab Results  Component Value Date   VITAMINB12 290 07/21/2017   Lab Results  Component Value Date   TSH 1.610 11/03/2018     ASSESSMENT AND PLAN 73 y.o. year old female  has a past medical history of Anemia, Cervical neuritis, Chronic kidney disease, Dyslipidemia, GERD (gastroesophageal reflux disease), History of transient ischemic attack (TIA), HTN (hypertension), Hyperlipidemia, Migraine, Migraine headache, Occipital neuralgia, Pulmonary embolism (HCC) (2017), Renal insufficiency, and Stroke (HCC) (10/2013). here with    No diagnosis found.   Jolinda reports headaches are fairly stable. We will continue zonisamide  100mg  twice daily with extra 25mg  dose twice daily as needed. She will continue Nurtec daily as needed for abortive therapy. Insomnia is stable on doxepin  100mg   at bedtime. We will continue slow weaning process. She was advised against taking trazodone  with doxepin . She will continue working on healthy lifestyle habits. She will follow up with me in 6-12 months.     No orders of the defined types were placed in this encounter.     No orders of the defined types were placed in this encounter.      I spent 30 minutes of face-to-face and non-face-to-face time with patient.  This included previsit chart review, lab review, study review, order entry, electronic health record documentation, patient education.   Greig Forbes, FNP-C 03/16/2023, 10:26 AM [facility] [address] [place], [place] [address] [phone]

## 2023-03-18 ENCOUNTER — Encounter: Payer: Self-pay | Admitting: Family Medicine

## 2023-03-18 ENCOUNTER — Ambulatory Visit: Payer: Medicare Other | Admitting: Family Medicine

## 2023-03-18 DIAGNOSIS — G43009 Migraine without aura, not intractable, without status migrainosus: Secondary | ICD-10-CM

## 2023-03-18 DIAGNOSIS — G4700 Insomnia, unspecified: Secondary | ICD-10-CM

## 2023-03-18 DIAGNOSIS — G4733 Obstructive sleep apnea (adult) (pediatric): Secondary | ICD-10-CM

## 2023-03-24 ENCOUNTER — Other Ambulatory Visit: Payer: Self-pay

## 2023-03-24 MED ORDER — ZONISAMIDE 25 MG PO CAPS: ORAL_CAPSULE | ORAL | 0 refills | Status: DC

## 2023-03-31 ENCOUNTER — Ambulatory Visit: Payer: Medicare Other | Admitting: Family Medicine

## 2023-04-06 ENCOUNTER — Other Ambulatory Visit: Payer: Self-pay | Admitting: Student

## 2023-04-06 DIAGNOSIS — I10 Essential (primary) hypertension: Secondary | ICD-10-CM

## 2023-04-14 ENCOUNTER — Ambulatory Visit (INDEPENDENT_AMBULATORY_CARE_PROVIDER_SITE_OTHER): Payer: Medicare Other | Admitting: Student

## 2023-04-14 ENCOUNTER — Encounter: Payer: Self-pay | Admitting: Student

## 2023-04-14 VITALS — BP 111/68 | HR 98 | Ht 66.0 in | Wt 186.0 lb

## 2023-04-14 DIAGNOSIS — R052 Subacute cough: Secondary | ICD-10-CM | POA: Diagnosis not present

## 2023-04-14 MED ORDER — BENZONATATE 100 MG PO CAPS: 100.0000 mg | ORAL_CAPSULE | Freq: Three times a day (TID) | ORAL | 0 refills | Status: DC | PRN

## 2023-04-14 NOTE — Assessment & Plan Note (Addendum)
 Present for 3 weeks.  History most consistent with postviral cough as she had other URI symptoms the first week which have since resolved.  Less concern for CAP given she is well-appearing, VSS, no focal lung sounds, no fever and low concern for atypical pneumonia given the 1 week of URI symptoms. -Rx Tessalon  Perles 100 mg 3 times daily as needed -Continue hot tea with honey, Flonase  2 sprays each nostril daily, albuterol  as needed -If cough persists for another few weeks or symptoms worsen, patient advised to return

## 2023-04-14 NOTE — Progress Notes (Signed)
    SUBJECTIVE:   CHIEF COMPLAINT / HPI:   Cough Present for 3 weeks. At start of the 3 weeks, felt  tired, body aches, congested with clear mucus and productive cough with clear sputum. Never had a fever. Felt better w/in 1.5 week but cough with clear sputum lingered. She has been using Flonase , her prn albuterol , Uses hot tea with honey, cough drops  which has helped some. Of note, saw Dr. Koval for chronic cough in 01/2022 for PFTs showing possible restrictive lung disease and recommended albuterol  prn.   PERTINENT  PMH / PSH: Polypharmacy, abnormal PFTs  OBJECTIVE:   BP 111/68   Pulse 98   Ht 5' 6 (1.676 m)   Wt 186 lb (84.4 kg)   SpO2 97%   BMI 30.02 kg/m    General: NAD, pleasant, well-appearing HEENT: Sclera, clear conjunctiva, MMM, no erythema, cobblestoning, or exudate of oropharynx Cardiac: RRR, no murmurs. Respiratory: CTAB, normal effort, No wheezes, rales or rhonchi Skin: warm and dry Neuro: alert, no obvious focal deficits Psych: Normal affect and mood  ASSESSMENT/PLAN:   Subacute cough Present for 3 weeks.  History most consistent with postviral cough as she had other URI symptoms the first week which have since resolved.  Less concern for CAP given she is well-appearing, VSS, no focal lung sounds, no fever and low concern for atypical pneumonia given the 1 week of URI symptoms. -Rx Tessalon  Perles 100 mg 3 times daily as needed -Continue hot tea with honey, Flonase  2 sprays each nostril daily, albuterol  as needed -If cough persists for another few weeks or symptoms worsen, patient advised to return     Dr. Lauraine Molt, DO [facility]

## 2023-04-14 NOTE — Patient Instructions (Signed)
 It was great to see you! Thank you for allowing me to participate in your care!  I recommend that you always bring your medications to each appointment as this makes it easy to ensure you are on the correct medications and helps us  not miss when refills are needed.  Our plans for today:  - Tessalon  pearls sent to pharmacy for cough, Use three times a day as needed. - Use Flonase  , 2 sprays in each nostril daily - continue hot tea with honey and cough drops - If no improvement over the next 3 weeks or if symptoms worsen, please return   Take care and seek immediate care sooner if you develop any concerns.   Dr. Lauraine Molt, DO [facility]

## 2023-04-16 DIAGNOSIS — M170 Bilateral primary osteoarthritis of knee: Secondary | ICD-10-CM | POA: Diagnosis not present

## 2023-04-21 DIAGNOSIS — M19011 Primary osteoarthritis, right shoulder: Secondary | ICD-10-CM | POA: Diagnosis not present

## 2023-04-21 DIAGNOSIS — M19041 Primary osteoarthritis, right hand: Secondary | ICD-10-CM | POA: Diagnosis not present

## 2023-05-01 ENCOUNTER — Encounter: Payer: Medicare Other | Admitting: Physical Medicine & Rehabilitation

## 2023-05-01 ENCOUNTER — Other Ambulatory Visit: Payer: Self-pay | Admitting: Student

## 2023-05-01 DIAGNOSIS — F419 Anxiety disorder, unspecified: Secondary | ICD-10-CM

## 2023-05-02 ENCOUNTER — Other Ambulatory Visit: Payer: Self-pay | Admitting: Student

## 2023-05-08 ENCOUNTER — Ambulatory Visit (INDEPENDENT_AMBULATORY_CARE_PROVIDER_SITE_OTHER): Payer: Medicare Other | Admitting: Family

## 2023-05-08 ENCOUNTER — Encounter (HOSPITAL_BASED_OUTPATIENT_CLINIC_OR_DEPARTMENT_OTHER): Payer: Self-pay | Admitting: Family

## 2023-05-08 VITALS — BP 134/86 | HR 68 | Ht 66.0 in | Wt 180.2 lb

## 2023-05-08 DIAGNOSIS — I10 Essential (primary) hypertension: Secondary | ICD-10-CM

## 2023-05-08 DIAGNOSIS — I25118 Atherosclerotic heart disease of native coronary artery with other forms of angina pectoris: Secondary | ICD-10-CM | POA: Diagnosis not present

## 2023-05-08 DIAGNOSIS — G4733 Obstructive sleep apnea (adult) (pediatric): Secondary | ICD-10-CM | POA: Diagnosis not present

## 2023-05-08 DIAGNOSIS — E785 Hyperlipidemia, unspecified: Secondary | ICD-10-CM | POA: Diagnosis not present

## 2023-05-08 NOTE — Progress Notes (Signed)
 Cardiology Office Note:  .   Date:  05/08/2023  ID:  [name], DOB 1950/04/08, MRN [id number] PCP: Erick Alley, DO  [facility] Providers Cardiologist:  Chilton Si, MD    History of Present Illness: [name] is a 73 y.o. female with a hx of HTN, coronary artery calcification by CT, HLD, CKD III, CVA, left subclavian artery stenosis, prior tobacco use.    She was admitted May 2019 with shortness of breath.  Lower extremity Doppler negative for DVT.  VQ scan showed perfusion defect deemed intermittent probability.  She was treated with Eliquis for 6 months.  Myoview July 2019 EF 66%, no ischemia.  Repeat Myoview October 2021 EF 66%, apical anterior and apical defect due to breast attenuation.  Referred to pulmonology and felt to be related to deconditioning and reactive airway disease, treated with Breo and albuterol.  When seen 03/16/2020 by Dr. Duke Salvia amlodipine was increased to 5 mg due to hypertension.  When seen 05/22/2020 she was provided clearance for anterior cervical vasectomy and fusion of C3-4   At clinic visit 06/28/2021 she noted difficulty with statins and was started on Incliseron. Sleep study 08/2021 with moderate OSA recommended for CPAP.     Seen 08/05/22 with 3 episodes of syncope without prodromal symptoms.  She was not orthostatic.  30-day heart monitor, CT neck, chest as well as echocardiogram were recommended. Echo 09/04/22 LVEF 60 to 65%, mild LVH, grade 1 diastolic dysfunction, mild to moderate aortic regurgitation.  CT chest 08/11/22 no PE, known compression fracture of thoracic spine, coronary calcification, aortic atherosclerosis. 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 She did not complete monitor and as no recurrent symptoms, it was deferred.   Last seen 12/08/22, Zetia was stopped as lipids controlled on Crestor, incliseron.    She presents today for  follow up independently.  Had some congestion Christmas with lingering cough for which she followed with PCP . Blood pressure at home 130s/70-80s. 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 Walks constantly while she is at the hair salon working. Stable mild exertional dyspnea with more than usual activity. Reports no chest pain, pressure, or tightness. No edema, orthopnea, PND. Reports no palpitations.    ROS: Please see the history of present illness.    All other systems reviewed and are negative.   Studies Reviewed: Marland Kitchen   EKG Interpretation Date/Time:  Friday May 08 2023 08:10:26 EST Ventricular Rate:  68 PR Interval:  140 QRS Duration:  76 QT Interval:  394 QTC Calculation: 418 R Axis:   56  Text Interpretation: Normal sinus rhythm Normal ECG Confirmed by Gillian Shields ([id number]) on 05/08/2023 8:13:47 AM       Risk Assessment/Calculations:             Physical Exam:   VS:  BP 134/86   Pulse 68   Ht 5\' 6"  (1.676 m)   Wt 180 lb 3.2 oz (81.7 kg)   SpO2 94%   BMI 29.09 kg/m    Wt Readings from Last 3 Encounters:  05/08/23 180 lb 3.2 oz (81.7 kg)  04/14/23 186 lb (84.4 kg)  02/17/23 184 lb 12.8 oz (83.8 kg)    GEN: Well nourished, well developed in no acute distress NECK: No JVD; No carotid bruits CARDIAC: RRR, no murmurs, rubs, gallops RESPIRATORY:  Clear to auscultation without  rales, wheezing or rhonchi  ABDOMEN: Soft, non-tender, non-distended EXTREMITIES:  No edema; No deformity   ASSESSMENT AND PLAN: .    Syncope - No recurrence. 08/2022 CT angio chest, neck and echo with no clear cause as detailed above. Monitor previously ordered but did not wear, as no recurrent syncope will defer. She attributes prior syncope to migraine.    HTN - BP well controlled. Continue current antihypertensive regimen Amlodipine 5mg  QD, Lasix 40mg  QD. Discussed to monitor BP at home at least 2 hours after medications and sitting  for 5-10 minutes.    Coronary artery calcification / HLD - Stable with no anginal symptoms. No indication for ischemic evaluation.  GDMT includes Plavix,  Crestor, Incliseron. Previously intolerant to higher doses Crestor, Praluent, atorvastatin. 07/2022 LDL 10. Stop Zetia to simplify regimen.    CKD III - Careful titration of diuretic and antihypertensive.   Obesity - Successful weight loss of a couple pounds since last office visit. Weight loss via diet and exercise encouraged. Discussed the impact being overweight would have on cardiovascular risk. Recommend aiming for 150 minutes of moderate intensity activity per week and following a heart healthy diet.      OSA -CPAP compliance encouraged.        Dispo: follow up in 1 year  Signed, Alver Sorrow, NP

## 2023-05-08 NOTE — Patient Instructions (Signed)
 Medication Instructions:  Your physician has recommended you make the following change in your medication:   Stop: Zetia   Follow-Up: At [facility], you and your health needs are our priority.  As part of our continuing mission to provide you with exceptional heart care, we have created designated Provider Care Teams.  These Care Teams include your primary Cardiologist (physician) and Advanced Practice Providers (APPs -  Physician Assistants and Nurse Practitioners) who all work together to provide you with the care you need, when you need it.  We recommend signing up for the patient portal called "MyChart".  Sign up information is provided on this After Visit Summary.  MyChart is used to connect with patients for Virtual Visits (Telemedicine).  Patients are able to view lab/test results, encounter notes, upcoming appointments, etc.  Non-urgent messages can be sent to your provider as well.   To learn more about what you can do with MyChart, go to [email or web address].    Your next appointment:   1 year with Dr. Duke Salvia or Luther Parody, NP

## 2023-05-28 ENCOUNTER — Encounter: Payer: Self-pay | Admitting: Physical Medicine & Rehabilitation

## 2023-05-28 ENCOUNTER — Encounter: Attending: Physical Medicine & Rehabilitation | Admitting: Physical Medicine & Rehabilitation

## 2023-05-28 VITALS — BP 167/76 | HR 92 | Ht 66.0 in | Wt 174.0 lb

## 2023-05-28 DIAGNOSIS — M47816 Spondylosis without myelopathy or radiculopathy, lumbar region: Secondary | ICD-10-CM | POA: Insufficient documentation

## 2023-05-28 NOTE — Patient Instructions (Signed)
 Repeat right L3-4-5 medial branch blocks next month.  If greater than 80% relief would proceed with RFA of the same nerves the following month

## 2023-05-28 NOTE — Progress Notes (Signed)
 PATIENT: [name] DOB: Oct 10, 1950  REASON FOR VISIT: follow up HISTORY FROM: patient  Chief Complaint  Patient presents with   Follow-up    Pt in room 2. Alone. Here for migraine follow up. Pt is no longer using cpap, stopped in fall last year., states she felt claustrophobic. Pt reports migraines are more frequent. Pt reports she only gets about 2 hours of sleep per night. 8 migraines last month.     HISTORY OF PRESENT ILLNESS:  06/03/2023 ALL:  Luster Landsberg returns for follow up for migraines, sleep apnea and insomnia. She was last seen 09/2022 and we continued nortriptyline and Nurtec and continued to work on weaning doxepin.   Since, she reports continued headaches. She has a headache, everyday, all day. Throbbing pain, usually in the back of her head/neck. Light and sound sensitivity. No vision changes or aura symptoms. She continues zonisamide 100mg  daily but has not used extra 25mg  dose, recently. She tolerate well. Nurtec helps. She is not sleeping well. She has used more trazodone recently. She has not taken doxepin in about 3 weeks. She ran out of refills and reports pharmacy was trying to reach [place]. No documentation in Epic. She was unable to toelrate CPAp and does not feel it is an option for her. She has not discussed dental appliance with dentist but plans to. She continues regular follow up with pain management. On tramadol and gabapentin. Mood stable on sertraline.   Medications tried and failed: Botox (2 cycles ineffective), Ajovy (ineffective), topiramate, zonisamide (on now), trazodone (on now), sertraline (on now), gabapentin (on now), propranolol, Nurtec, tramadol (takes at night), tizanidine, ondansetron, cyclobenzaprine, metoclopramide, triptans contraindicated due to PVD, CVA and TIA history   09/23/2022 ALL: Luster Landsberg returns for follow up for migraines, hx of OSA previously on CPAP and insomnia. She was last seen by Butch Penny 11/2021 and doing failry well on CPAP.  She was not happy with mask. I saw her last 73/2023 and advised need to wean dose of doxepin as she was on higher doses and also taking trazodone. She lost her husband in February.   Since, she reports no significant changes in headaches. Nurtec does help. She continues zonisamide 100mg  BID. May take extra 25mg  tablet 1-2 times a week. She has weaned doxepin over the past few months. Now taking 100mg  most nights. She tries to skip nights when she can. She does continue trazodone 50mg  at bedtime per PCP. Also taking sertraline. She feels mood is stable. She was unable to tolerate CPAP and reports returning machine. She is interested in speaking to dentist regarding oral appliance.   10/14/2021 ALL: Luster Landsberg returns for follow up for migraines and recently diagnosed OSA. She was last seen 73/2023 with worsening headaches as she had been off medications for 2 weeks. She resumed zonisamide 100mg  BID with extra 25mg  dose as needed. She also takes doxepin 200mg  at bedtime for insomnia. PCP also writes 25-50mg  trazodone for insomnia but she has not started this medication. Migraines are somewhat improved. She reports having 2 migraines a week. Nurtec does help with abortive therapy.   HST showed moderate OSA with total AHI of 27.6/hr and O2 nadir of 86%. AutoPAP ordered. She reports that she has been in contact with [facility] but not yet received her machine.    She is requesting to be excused from jury duty due to the severity of her migraines. Juror #[id number]  06/27/2021 ALL: Luster Landsberg returns for follow up for migraines. She continues zonisamide 100mg   BID with extra 25mg  dose as needed. She also takes doxepin 200mg  at bedtime for insomnia. PCP also writes 25-50mg  trazodone for insomnia. She was seen in consult with Dr Frances Furbish 72/2023 and advised against taking two sleep aids. HST ordered. She was encouraged to decrease caffeine intake.   She has been off zonisamide for 2 weeks. She reports headaches are significantly worse.  She has had a bad headache for the past few days. She was able to pick up all meds yesterday and restarted last night. Nurtec She reports that she does not drink caffeine. She endorses significant stress. She is caring for her brother and husband who are both ill. She is not sleeping well despite taking doxepin 200mg  and trazodone 25-50mg  (from PCP). She wakes multiple times at night. She has not scheduled sleep study due to being limited on time. She reports BP has been elevated this week. She feels that it is elevated due to headaches from being off medications.   73/20/2022 ALL:  Luster Landsberg returns with concerns of worsening headaches. She continues zonisamide 100mg  BID with extra 25mg  dose daily as needed. She reports worsening over the past 1-2 months. Headaches are more frequent (near daily) and more intense. Similar symptoms has previous headaches. No new symptoms. Most are present in the morning. Can worsen during the day. She has had an intractable migraine for the past 3 days. Tizanidine and Tylenol usually abort migraines but she has not gotten much relief. She continues doxepin 200mg  QHS for insomnia managed with our office. Trazodone 25-50 prescribed with PCP. She has interrupted sleep. She does snore. She wakes multiple times at night. She does not wake refreshed. She does have dry mouth. Brother has sleep apnea on CPAP.   Medications tried and failed: Ajovy (ineffective), topiramate, zonisamide (on now), trazodone (on now), sertraline (on now), gabapentin (on now), propranolol, Nurtec, tramadol (takes at night), tizanidine, ondansetron, cyclobenzaprine, metoclopramide, triptans contraindicated due to PVD, CVA and TIA history   73/05/2020 ALL: Luster Landsberg returns for follow up for migraines and insomnia. She is doing well. She continues zonisamide 100mg  BID and doxepin 200mg  at bedtime. She has had several stressors since we last saw her. Her brother had to have emergency surgery so she has had to help care  for him and his hair salon.  She is seeing Dr Shon Baton, [facility], and Dr Wynn Banker, sports med, for chronic neck and back pain. Dr Wynn Banker has her on gabapentin 600mg  twice daily that helps some. Dr Aundria Rud for broken shoulder and Dr Jannette Spanner for chronic knee pain.   Headaches are occurring most days. She feels that she has pain starting in her neck that radiates to the top of her head everyday around 3pm. She has not tried taking extra 25mg  dose of imipramine. She is sleeping well. She is tolerating doxepin.   10/10/2019 ALL:  ALGA SOUTHALL is a 73 y.o. female here today for follow up for headaches. She continues zonisamide 100mg  daily. She has a 25mg  dose that she rarely takes. She has daily headaches, mostly in the mornings, that are usually easily treatable. She does note that weather changes are a trigger for her. She is able to abort headaches with rest. She may have 1-2 migraines per month.   She continues doxepin 200mg  at night for insomnia. She has been on this medication for years. She reports that she has a difficult time sleeping. She struggles to go to sleep at night and to stay asleep. She does wake frequently at  night to use the restroom. She wakes with a dry mouth. She is not sure if she snores. She has a brother who uses CPAP therapy.   HISTORY: (copied from my note on 12/09/2018)  NADEA KIRKLAND is a 73 y.o. female here today for follow up for migraines. She started Ajovy about a year ago. She felt that it helped initially but after a few months it did not seem to help. She has daily dull headaches, usually behind the right eye. About 1-2 times a month she has more severe headache with light, sound sensitivity and nausea. She continues zonisamide 100mg  and tizanidine 2mg  daily. She takes gabapentin 600mg  1-2 times daily for headaches and dysesthesias in her left side. Doxepin helps with sleep. She is followed closely by PCP for BP and cholesterol management. She is intolerant to  statins, now on Repatha. On Plavix.    HISTORY: (copied from [place] note on 03/06/2018)   Ms. Gagen is a 73 year old female with a history of intractable migraine headaches.  She returns today for follow-up.  Overall she feels that she has done relatively well.  He continues to have a daily dull headache that is in the right temporal region.  However she states that she has not had a severe headache in the last 6 months.  She reports she does have photophobia with her headaches.  She states that if she misses her medication then her headache will worsen.  She reports that she was unable to obtain Aimovig.  She continues on Zonegran and tizanidine.   HISTORY 07/01/17 Ms. Popiel is a 73 year old female with a history of intractable migraine headaches.  She returns today for follow-up.  She reports that she continues to have a daily dull headache.  She remains on Zonegran and tizanidine.  She states that she did receive a letter about Aimovig but was unable to follow-up on this due to her brother being in the hospital.  She states that she only has approximately one severe migraine a month.  Her headaches seem to occur on the right side starting at the neck radiating to the eyes.  She does have photophobia, phonophobia and nausea.  She continues to take doxepin at night.  She returns today for an evaluation.    REVIEW OF SYSTEMS: Out of a complete 14 system review of symptoms, the patient complains only of the following symptoms, morning headaches, dry mouth, fatigue, chronic pain, insomnia and all other reviewed systems are negative.   ALLERGIES: Allergies  Allergen Reactions   Iodinated Contrast Media Shortness Of Breath and Itching    States she had this reaction connected with a MRI of the cervical spine.   Shellfish Allergy Anaphylaxis    All seafood   Crestor [Rosuvastatin] Other (See Comments)    Anxiety, itching, headaches, myalgias Able to tolerate 5mg  daily   Lipitor  [Atorvastatin] Other (See Comments)    Anxiety, itching, headaches, myalgias   Statins Other (See Comments)    Arthralgias (severe) with atorvastatin, mild arthralgias with rosuvastatin but willing to continue taking it   Iodine     HOME MEDICATIONS: Outpatient Medications Prior to Visit  Medication Sig Dispense Refill   albuterol (VENTOLIN HFA) 108 (90 Base) MCG/ACT inhaler Inhale 2 puffs into the lungs every 6 (six) hours as needed for wheezing or shortness of breath. 8 g 6   amLODipine (NORVASC) 5 MG tablet TAKE 1 TABLET (5 MG TOTAL) BY MOUTH DAILY. 90 tablet 3   AREXVY 120 MCG/0.5ML  injection      clopidogrel (PLAVIX) 75 MG tablet TAKE 1 TABLET BY MOUTH EVERY DAY 90 tablet 3   fluticasone (FLONASE) 50 MCG/ACT nasal spray SPRAY 1 SPRAY INTO BOTH NOSTRILS DAILY. 16 mL 2   furosemide (LASIX) 40 MG tablet Take 1 tablet (40 mg total) by mouth daily. 30 tablet 2   gabapentin (NEURONTIN) 600 MG tablet TAKE 1 TABLET BY MOUTH THREE TIMES A DAY 90 tablet 3   inclisiran (LEQVIO) 284 MG/1.5ML SOSY injection Inject as directed 1.5 mL 0   losartan (COZAAR) 25 MG tablet TAKE 1 TABLET BY MOUTH EVERYDAY AT BEDTIME 90 tablet 0   ondansetron (ZOFRAN) 4 MG tablet Take 1 tablet (4 mg total) by mouth every 8 (eight) hours as needed for nausea or vomiting. 20 tablet 0   pantoprazole (PROTONIX) 40 MG tablet TAKE 1 TABLET(40 MG) BY MOUTH TWICE DAILY 180 tablet 1   rosuvastatin (CRESTOR) 5 MG tablet TAKE 1 TABLET (5 MG TOTAL) BY MOUTH DAILY. 90 tablet 3   sertraline (ZOLOFT) 100 MG tablet TAKE 1 TABLET BY MOUTH EVERY DAY 90 tablet 3   tiZANidine (ZANAFLEX) 4 MG tablet Take 1 tablet (4 mg total) by mouth every 6 (six) hours as needed for muscle spasms. 180 tablet 1   traMADol (ULTRAM) 50 MG tablet TAKE 1 TABLET EVERY 8 HOURS AS NEEDED FOR MODERATE PAIN 90 tablet 5   traZODone (DESYREL) 50 MG tablet TAKE 1/2 TO 1 TABLET BY MOUTH AT BEDTIME AS NEEDED FOR SLEEP 90 tablet 0   doxepin (SINEQUAN) 100 MG capsule Take 1  capsule (100 mg total) by mouth at bedtime. 90 capsule 1   Rimegepant Sulfate (NURTEC) 75 MG TBDP Take 1 tablet (75 mg total) by mouth daily as needed (take for abortive therapy of migraine, no more than 1 tablet in 24 hours or 10 per month). 8 tablet 11   zonisamide (ZONEGRAN) 100 MG capsule Take 1 capsule (100 mg total) by mouth 2 (two) times daily. 180 capsule 3   zonisamide (ZONEGRAN) 25 MG capsule take 25 mg twice daily prn in addition to 100 mg twice daily. 180 capsule 0   cetirizine (ZYRTEC) 10 MG tablet Take 1 tablet (10 mg total) by mouth daily with breakfast. 30 tablet 0   No facility-administered medications prior to visit.    PAST MEDICAL HISTORY: Past Medical History:  Diagnosis Date   Anemia    Cervical neuritis    Chronic kidney disease    stage 3   Dyslipidemia    GERD (gastroesophageal reflux disease)    History of transient ischemic attack (TIA)    HTN (hypertension)    Hyperlipidemia    Migraine    Migraine headache    Occipital neuralgia    Left   Pulmonary embolism (HCC) 2017   Renal insufficiency    Stroke (HCC) 10/2013    PAST SURGICAL HISTORY: Past Surgical History:  Procedure Laterality Date   ABDOMINAL HYSTERECTOMY     ANTERIOR CERVICAL DECOMP/DISCECTOMY FUSION N/A 05/24/2020   Procedure: ANTERIOR CERVICAL DECOMPRESSION/DISCECTOMY FUSION 1 LEVEL C3-4;  Surgeon: Venita Lick, MD;  Location: [facility] OR;  Service: Orthopedics;  Laterality: N/A;  3 hrs   BILIARY STENT PLACEMENT N/A 12/12/2015   Procedure: BILIARY STENT PLACEMENT;  Surgeon: Sherrilyn Rist, MD;  Location: [facility] ENDOSCOPY;  Service: Endoscopy;  Laterality: N/A;   BLADDER REPAIR     CARPAL TUNNEL RELEASE     CERVICAL FUSION     CERVICAL SPINE SURGERY  CHOLECYSTECTOMY N/A 12/06/2015   Procedure: LAPAROSCOPIC CHOLECYSTECTOMY WITH  INTRAOPERATIVE CHOLANGIOGRAM;  Surgeon: Almond Lint, MD;  Location: [facility] OR;  Service: General;  Laterality: N/A;   ELBOW SURGERY     ERCP N/A 12/12/2015    Procedure: ENDOSCOPIC RETROGRADE CHOLANGIOPANCREATOGRAPHY (ERCP);  Surgeon: Sherrilyn Rist, MD;  Location: [facility] ENDOSCOPY;  Service: Endoscopy;  Laterality: N/A;   ESOPHAGOGASTRODUODENOSCOPY N/A 01/28/2016   Procedure: ESOPHAGOGASTRODUODENOSCOPY (EGD) Biliary STENT removal;  Surgeon: Sherrilyn Rist, MD;  Location: [facility] ENDOSCOPY;  Service: Gastroenterology;  Laterality: N/A;   IR GENERIC HISTORICAL  12/17/2015   IR US GUIDE BX ASP/DRAIN 12/17/2015 [facility]   IR GENERIC HISTORICAL  12/17/2015   IR GUIDED DRAIN W CATHETER PLACEMENT 12/17/2015 [facility]   IR GENERIC HISTORICAL  12/17/2015   IR SINUS/FIST TUBE CHK-NON GI 12/17/2015 [facility]   KNEE SURGERY     LUNG SURGERY     TEE WITHOUT CARDIOVERSION N/A 12/19/2015   Procedure: TRANSESOPHAGEAL ECHOCARDIOGRAM (TEE);  Surgeon: Wendall Stade, MD;  Location[facility] END[facility]COPY;  Service: Cardiovascular;  Laterality: N/A;    FAMILY HISTORY: Family History  Problem Relation Age of Onset   Cancer Mother 85       breast   Alzheimer's disease Mother    Prostate cancer Father    Lung cancer Father    Cancer Brother    Hyperlipidemia Brother    Sleep apnea Brother    Cancer Brother    Hyperlipidemia Brother    Dementia Brother        frontotemporal dementia   Dementia Maternal Grandmother    Cancer Maternal Grandfather     SOCIAL HISTORY: Social History   Socioeconomic History   Marital status: Married    Spouse name: [name]   Number of children: 1   Years of education: 12+   Highest education level: Tax adviser degree: occupational, Scientist, product/process development, or vocational program  Occupational History   Occupation: ACTIVITY DIRECTOR    Employer: [employer]   Occupation: CNA   Occupation: Retired  Tobacco Use   Smoking status: Former    Current packs/day: 0.00    Average packs/day: 1.5 packs/day for 42.6 years (64.0 ttl pk-yrs)    Types: Cigarettes    Start date: 03/10/1969    Quit date: 11/01/2011    Years since quitting: 11.5    Smokeless tobacco: Never   Tobacco comments:    Smoked 1.5-2 ppd for adult life (sal[place] menthols)  Vaping Use   Vaping status: Never Used  Substance and Sexual Activity   Alcohol use: No    Alcohol/week: 0.0 standard drinks of alcohol   Drug use: No   Sexual activity: Yes    Partners: Male    Birth control/protection: Surgical    Comment: 1 sexual partner in last 12 months: married  Other Topics Concern   Not on file  Social History Narrative    Patient is married ([name]).   Patient has one child and grandchild. Her sons name is [name]. Patient sees them often.   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 Patient spends 6-10 hours with her 6 days a week.    Patient has a college education.   Patient is very active in her church.    Hobbies- outside crafts and church choir.   Social Drivers of Health   Financial Resource Strain: Medium Risk (12/27/2022)   Overall Financial Resource Strain (CARDIA)    Difficulty of Paying  Living Expenses: Somewhat hard  Food Insecurity: Food Insecurity Present (12/27/2022)   Hunger Vital Sign    Worried About Running Out of Food in the Last Year: Sometimes true    Ran Out of Food in the Last Year: Sometimes true  Transportation Needs: No Transportation Needs (12/27/2022)   PRAPARE - Administrator, Civil Service (Medical): No    Lack of Transportation (Non-Medical): No  Physical Activity: Insufficiently Active (12/27/2022)   Exercise Vital Sign    Days of Exercise per Week: 4 days    Minutes of Exercise per Session: 30 min  Stress: Stress Concern Present (12/27/2022)   [employer] of Occupational Health - Occupational Stress Questionnaire    Feeling of Stress : To some extent  Social Connections: Moderately Integrated (12/27/2022)   Social Connection and Isolation Panel [NHANES]    Frequency of Communication with Friends and Family: More than three times a week    Frequency of  Social Gatherings with Friends and Family: More than three times a week    Attends Religious Services: More than 4 times per year    Active Member of [employer] or Organizations: Yes    Attends Banker Meetings: More than 4 times per year    Marital Status: Widowed  Intimate Partner Violence: Not At Risk (06/19/2022)   Humiliation, Afraid, Rape, and Kick questionnaire    Fear of Current or Ex-Partner: No    Emotionally Abused: No    Physically Abused: No    Sexually Abused: No      PHYSICAL EXAM  Vitals:   06/03/23 1110  BP: 120/73  Pulse: 76  Weight: 178 lb 8 oz (81 kg)  Height: 5\' 6"  (1.676 m)    Body mass index is 28.81 kg/m.  Generalized: Well developed, in no acute distress  Cardiology: normal rate and rhythm, no murmur noted Respiratory: clear to auscultation bilaterally  Neurological examination  Mentation: Alert oriented to time, place, history taking. Follows all commands speech and language fluent Cranial nerve II-XII: Pupils were equal round reactive to light. Extraocular movements were full, visual field were full on confrontational test. Facial sensation and strength were normal. Head turning and shoulder shrug  were normal and symmetric. Motor: The motor testing reveals 5 over 5 strength of all 4 extremities. Good symmetric motor tone is noted throughout.  Sensory: Sensory testing is intact to soft touch on all 4 extremities. No evidence of extinction is noted.   Gait and station: Gait is stable without assistive device     DIAGNOSTIC DATA (LABS, IMAGING, TESTING) - I reviewed patient records, labs, notes, testing and imaging myself where available.      No data to display           Lab Results  Component Value Date   WBC 7.6 08/05/2022   HGB 12.9 08/05/2022   HCT 41.6 08/05/2022   MCV 86 08/05/2022   PLT 270 08/05/2022      Component Value Date/Time   NA 143 02/16/2023 1633   K 3.6 02/16/2023 1633   CL 103 02/16/2023 1633   CO2 25  02/16/2023 1633   GLUCOSE 143 (H) 02/16/2023 1633   GLUCOSE 135 (H) 08/29/2019 1030   BUN 14 02/16/2023 1633   CREATININE 1.33 (H) 02/16/2023 1633   CREATININE 1.10 (H) 02/25/2016 0907   CALCIUM 8.6 (L) 02/16/2023 1633   PROT 6.6 02/03/2023 1636   ALBUMIN 4.8 02/03/2023 1636   AST 26 02/03/2023 1636   ALT  20 02/03/2023 1636   ALKPHOS 134 (H) 02/03/2023 1636   BILITOT 0.3 02/03/2023 1636   GFRNONAA >60 08/29/2019 1030   GFRNONAA 53 (L) 02/25/2016 0907   GFRAA >60 08/29/2019 1030   GFRAA 61 02/25/2016 0907   Lab Results  Component Value Date   CHOL 106 08/06/2022   HDL 82 08/06/2022   LDLCALC 10 08/06/2022   LDLDIRECT 156 (H) 03/27/2017   TRIG 66 08/06/2022   CHOLHDL 1.3 08/06/2022   Lab Results  Component Value Date   HGBA1C 5.5 07/30/2017   Lab Results  Component Value Date   VITAMINB12 290 07/21/2017   Lab Results  Component Value Date   TSH 1.610 11/03/2018     ASSESSMENT AND PLAN 73 y.o. year old female  has a past medical history of Anemia, Cervical neuritis, Chronic kidney disease, Dyslipidemia, GERD (gastroesophageal reflux disease), History of transient ischemic attack (TIA), HTN (hypertension), Hyperlipidemia, Migraine, Migraine headache, Occipital neuralgia, Pulmonary embolism (HCC) (2017), Renal insufficiency, and Stroke (HCC) (10/2013). here with     ICD-10-CM   1. Migraine without aura and without status migrainosus, not intractable  G43.009     2. Obstructive sleep apnea syndrome  G47.33     3. Insomnia, unspecified type  G47.00       Solae reports headaches have been worse over the past few weeks since being out of doxepin and not sleeping well. We will continue zonisamide 100mg  twice daily with extra 25mg  dose twice daily as needed. I have advised she take 25mg  BID for 2-4 weeks to see if this will help settle headaches. She will continue Nurtec daily as needed for abortive therapy. Insomnia previously stable on doxepin 100mg  at bedtime. We will  continue slow weaning process but will continue 100mg  for now until headaches are better managed. She was advised against taking trazodone with doxepin. She will continue working on healthy lifestyle habits. She will follow up with Dr Epimenio Foot in 6 months to establish care with new neurologist in Dr Anne Hahn' retirement. I anticipate continued follow up with me. She verbalizes understanding and agreement with this plan.    No orders of the defined types were placed in this encounter.     Meds ordered this encounter  Medications   zonisamide (ZONEGRAN) 100 MG capsule    Sig: Take 1 capsule (100 mg total) by mouth 2 (two) times daily.    Dispense:  180 capsule    Refill:  3    Supervising Provider:   Anson Fret [[id number]]   zonisamide (ZONEGRAN) 25 MG capsule    Sig: take 25 mg twice daily prn in addition to 100 mg twice daily.    Dispense:  180 capsule    Refill:  0    Supervising Provider:   Anson Fret [[id number]]   doxepin (SINEQUAN) 100 MG capsule    Sig: Take 1 capsule (100 mg total) by mouth at bedtime.    Dispense:  90 capsule    Refill:  1    Supervising Provider:   Anson Fret [[id number]]   Rimegepant Sulfate (NURTEC) 75 MG TBDP    Sig: Take 1 tablet (75 mg total) by mouth daily as needed (take for abortive therapy of migraine, no more than 1 tablet in 24 hours or 10 per month).    Dispense:  8 tablet    Refill:  11    Supervising Provider:   Anson Fret [[id number]]       I spent 30 minutes  of face-to-face and non-face-to-face time with patient.  This included previsit chart review, lab review, study review, order entry, electronic health record documentation, patient education.   Shawnie Dapper, FNP-C 06/03/2023, 1:56 PM [facility] [address], [address] [place], [place] [address] [phone]

## 2023-05-28 NOTE — Patient Instructions (Signed)
 Below is our plan:  We will continue zonisamide 100mg  twice daily. Add 25mg  dose twice daily for 2-4 weeks to see if this helps settle down headaches. May continue 125mg  twice daily if you are tolerating it well. Continue Nurtec as needed. We will send new prescription fo doxepin to pharmacy, today. Take at bedtime but avoid taking with trazodone. We will continue to wean in the future.   Please make sure you are staying well hydrated. I recommend 50-60 ounces daily. Well balanced diet and regular exercise encouraged. Consistent sleep schedule with 6-8 hours recommended.   Please continue follow up with care team as directed.   Follow up with Dr Epimenio Foot to establish care in [place]' retirement in 6 months. You will return to see me for follow up, thereafter.   You may receive a survey regarding today's visit. I encourage you to leave honest feed back as I do use this information to improve patient care. Thank you for seeing me today!   GENERAL HEADACHE INFORMATION:   Natural supplements: Magnesium Oxide or Magnesium Glycinate 500 mg at bed (up to 800 mg daily) Coenzyme Q10 300 mg in AM Vitamin B2- 200 mg twice a day   Add 1 supplement at a time since even natural supplements can have undesirable side effects. You can sometimes buy supplements cheaper (especially Coenzyme Q10) at www.WebmailGuide.co.za or at [facility].  Migraine with aura: There is increased risk for stroke in women with migraine with aura and a contraindication for the combined contraceptive pill for use by women who have migraine with aura. The risk for women with migraine without aura is lower. However other risk factors like smoking are far more likely to increase stroke risk than migraine. There is a recommendation for no smoking and for the use of OCPs without estrogen such as progestogen only pills particularly for women with migraine with aura.Marland Kitchen People who have migraine headaches with auras may be 3 times more likely to have a stroke  caused by a blood clot, compared to migraine patients who don't see auras. Women who take hormone-replacement therapy may be 30 percent more likely to suffer a clot-based stroke than women not taking medication containing estrogen. Other risk factors like smoking and high blood pressure may be  much more important.    Vitamins and herbs that show potential:   Magnesium: Magnesium (250 mg twice a day or 500 mg at bed) has a relaxant effect on smooth muscles such as blood vessels. Individuals suffering from frequent or daily headache usually have low magnesium levels which can be increase with daily supplementation of 400-750 mg. Three trials found 40-90% average headache reduction  when used as a preventative. Magnesium may help with headaches are aura, the best evidence for magnesium is for migraine with aura is its thought to stop the cortical spreading depression we believe is the pathophysiology of migraine aura.Magnesium also demonstrated the benefit in menstrually related migraine.  Magnesium is part of the messenger system in the serotonin cascade and it is a good muscle relaxant.  It is also useful for constipation which can be a side effect of other medications used to treat migraine. Good sources include nuts, whole grains, and tomatoes. Side Effects: loose stool/diarrhea  Riboflavin (vitamin B 2) 200 mg twice a day. This vitamin assists nerve cells in the production of ATP a principal energy storing molecule.  It is necessary for many chemical reactions in the body.  There have been at least 3 clinical trials of riboflavin  using 400 mg per day all of which suggested that migraine frequency can be decreased.  All 3 trials showed significant improvement in over half of migraine sufferers.  The supplement is found in bread, cereal, milk, meat, and poultry.  Most Americans get more riboflavin than the recommended daily allowance, however riboflavin deficiency is not necessary for the supplements to help  prevent headache. Side effects: energizing, green urine   Coenzyme Q10: This is present in almost all cells in the body and is critical component for the conversion of energy.  Recent studies have shown that a nutritional supplement of CoQ10 can reduce the frequency of migraine attacks by improving the energy production of cells as with riboflavin.  Doses of 150 mg twice a day have been shown to be effective.   Melatonin: Increasing evidence shows correlation between melatonin secretion and headache conditions.  Melatonin supplementation has decreased headache intensity and duration.  It is widely used as a sleep aid.  Sleep is natures way of dealing with migraine.  A dose of 3 mg is recommended to start for headaches including cluster headache. Higher doses up to 15 mg has been reviewed for use in Cluster headache and have been used. The rationale behind using melatonin for cluster is that many theories regarding the cause of Cluster headache center around the disruption of the normal circadian rhythm in the brain.  This helps restore the normal circadian rhythm.   HEADACHE DIET: Foods and beverages which may trigger migraine Note that only 20% of headache patients are food sensitive. You will know if you are food sensitive if you get a headache consistently 20 minutes to 2 hours after eating a certain food. Only cut out a food if it causes headaches, otherwise you might remove foods you enjoy! What matters most for diet is to eat a well balanced healthy diet full of vegetables and low fat protein, and to not miss meals.   Chocolate, other sweets ALL cheeses except cottage and cream cheese Dairy products, yogurt, sour cream, ice cream Liver Meat extracts (Bovril, Marmite, meat tenderizers) Meats or fish which have undergone aging, fermenting, pickling or smoking. These include: Hotdogs,salami,Lox,sausage, mortadellas,smoked salmon, pepperoni, Pickled herring Pods of broad bean (English beans,  Chinese pea pods, [place] (fava) beans, lima and navy beans Ripe avocado, ripe banana Yeast extracts or active yeast preparations such as Brewer's or Fleishman's (commercial bakes goods are permitted) Tomato based foods, pizza (lasagna, etc.)   MSG (monosodium glutamate) is disguised as many things; look for these common aliases: Monopotassium glutamate Autolysed yeast Hydrolysed protein Sodium caseinate "flavorings" "all natural preservatives" Nutrasweet   Avoid all other foods that convincingly provoke headaches.   Resources: The Dizzy [place] Laundry Your Headache Diet, migrainestrong.com  https://zamora-andrews.com/   Caffeine and Migraine For patients that have migraine, caffeine intake more than 3 days per week can lead to dependency and increased migraine frequency. I would recommend cutting back on your caffeine intake as best you can. The recommended amount of caffeine is 200-300 mg daily, although migraine patients may experience dependency at even lower doses. While you may notice an increase in headache temporarily, cutting back will be helpful for headaches in the long run. For more information on caffeine and migraine, visit: https://americanmigrainefoundation.org/resource-library/caffeine-and-migraine/   Headache Prevention Strategies:   1. Maintain a headache diary; learn to identify and avoid triggers.  - This can be a simple note where you log when you had a headache, associated symptoms, and medications used - There are several  smartphone apps developed to help track migraines: Migraine Buddy, Migraine Monitor, Curelator N1-Headache App   Common triggers include: Emotional triggers: Emotional/Upset family or friends Emotional/Upset occupation Business reversal/success Anticipation anxiety Crisis-serious Post-crisis periodNew job/position   Physical triggers: Vacation Day Weekend Strenuous Exercise High Altitude  Location New Move Menstrual Day Physical Illness Oversleep/Not enough sleep Weather changes Light: Photophobia or light sesnitivity treatment involves a balance between desensitization and reduction in overly strong input. Use dark polarized glasses outside, but not inside. Avoid bright or fluorescent light, but do not dim environment to the point that going into a normally lit room hurts. Consider FL-41 tint lenses, which reduce the most irritating wavelengths without blocking too much light.  These can be obtained at axonoptics.com or theraspecs.com Foods: see list above.   2. Limit use of acute treatments (over-the-counter medications, triptans, etc.) to no more than 2 days per week or 10 days per month to prevent medication overuse headache (rebound headache).     3. Follow a regular schedule (including weekends and holidays): Don't skip meals. Eat a balanced diet. 8 hours of sleep nightly. Minimize stress. Exercise 30 minutes per day. Being overweight is associated with a 5 times increased risk of chronic migraine. Keep well hydrated and drink 6-8 glasses of water per day.   4. Initiate non-pharmacologic measures at the earliest onset of your headache. Rest and quiet environment. Relax and reduce stress. Breathe2Relax is a free app that can instruct you on    some simple relaxtion and breathing techniques. Http://Dawnbuse.com is a    free website that provides teaching videos on relaxation.  Also, there are  many apps that   can be downloaded for "mindful" relaxation.  An app called YOGA NIDRA will help walk you through mindfulness. Another app called Calm can be downloaded to give you a structured mindfulness guide with daily reminders and skill development. Headspace for guided meditation Mindfulness Based Stress Reduction Online Course: www.palousemindfulness.com Cold compresses.   5. Don't wait!! Take the maximum allowable dosage of prescribed medication at the first sign of  migraine.   6. Compliance:  Take prescribed medication regularly as directed and at the first sign of a migraine.   7. Communicate:  Call your physician when problems arise, especially if your headaches change, increase in frequency/severity, or become associated with neurological symptoms (weakness, numbness, slurred speech, etc.). Proceed to emergency room if you experience new or worsening symptoms or symptoms do not resolve, if you have new neurologic symptoms or if headache is severe, or for any concerning symptom.   8. Headache/pain management therapies: Consider various complementary methods, including medication, behavioral therapy, psychological counselling, biofeedback, massage therapy, acupuncture, dry needling, and other modalities.  Such measures may reduce the need for medications. Counseling for pain management, where patients learn to function and ignore/minimize their pain, seems to work very well.   9. Recommend changing family's attention and focus away from patient's headaches. Instead, emphasize daily activities. If first question of day is 'How are your headaches/Do you have a headache today?', then patient will constantly think about headaches, thus making them worse. Goal is to re-direct attention away from headaches, toward daily activities and other distractions.   10. Helpful Websites: www.AmericanHeadacheSociety.org PatentHood.ch www.headaches.org TightMarket.nl www.achenet.org

## 2023-05-28 NOTE — Progress Notes (Signed)
 Subjective:    Patient ID: [name], female    DOB: [birth year], 73 y.o.   MRN: [id number]  HPI  CC:  RIght sided low back pain   Right L3-4-5 MBB performed in November seemed to last until beginning of January .  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 Tramadol 50mg  per day (written for TID) Pain Inventory Average Pain 10 Pain Right Now 5 My pain is sharp, burning, dull, stabbing, tingling, and aching  In the last 24 hours, has pain interfered with the following? General activity 0 Relation with others 7 Enjoyment of life 8 What TIME of day is your pain at its worst? evening Sleep (in general) Poor  Pain is worse with: walking, standing, and some activites Pain improves with: heat/ice and medication Relief from Meds: 5  Family History  Problem Relation Age of Onset   Cancer Mother 65       breast   Alzheimer's disease Mother    Prostate cancer Father    Lung cancer Father    Cancer Brother    Hyperlipidemia Brother    Sleep apnea Brother    Cancer Brother    Hyperlipidemia Brother    Dementia Brother        frontotemporal dementia   Dementia Maternal Grandmother    Cancer Maternal Grandfather    Social History   Socioeconomic History   Marital status: Married    Spouse name: [name]   Number of children: 1   Years of education: 12+   Highest education level: Tax adviser degree: occupational, Scientist, product/process development, or vocational program  Occupational History   Occupation: ACTIVITY DIRECTOR    Employer: [employer]   Occupation: CNA   Occupation: Retired  Tobacco Use   Smoking status: Former    Current packs/day: 0.00    Average packs/day: 1.5 packs/day for 42.6 years (64.0 ttl pk-yrs)    Types: Cigarettes    Start date: 03/10/1969    Quit date: 11/01/2011    Years since quitting: 11.5   Smokeless tobacco: Never   Tobacco comments:    Smoked 1.5-2 ppd for  adult life (salem menthols)  Vaping Use   Vaping status: Never Used  Substance and Sexual Activity   Alcohol use: No    Alcohol/week: 0.0 standard drinks of alcohol   Drug use: No   Sexual activity: Yes    Partners: Male    Birth control/protection: Surgical    Comment: 1 sexual partner in last 12 months: married  Other Topics Concern   Not on file  Social History Narrative    Patient is married ([name]).   Patient has one child and grandchild. Her sons name is [name]. Patient sees them often.   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 Patient spends 6-10 hours with her 6 days a week.    Patient has a college education.   Patient is very active in her church.    Hobbies- outside crafts and church choir.   Social Drivers of Health   Financial Resource Strain: Medium Risk (12/27/2022)   Overall Financial Resource Strain (CARDIA)    Difficulty of Paying Living Expenses: Somewhat hard  Food Insecurity: Food Insecurity Present (12/27/2022)   Hunger Vital Sign    Worried About Running Out of Food in the Last Year: Sometimes true    Ran Out  of Food in the Last Year: Sometimes true  Transportation Needs: No Transportation Needs (12/27/2022)   PRAPARE - Administrator, Civil Service (Medical): No    Lack of Transportation (Non-Medical): No  Physical Activity: Insufficiently Active (12/27/2022)   Exercise Vital Sign    Days of Exercise per Week: 4 days    Minutes of Exercise per Session: 30 min  Stress: Stress Concern Present (12/27/2022)   [employer] of Occupational Health - Occupational Stress Questionnaire    Feeling of Stress : To some extent  Social Connections: Moderately Integrated (12/27/2022)   Social Connection and Isolation Panel [NHANES]    Frequency of Communication with Friends and Family: More than three times a week    Frequency of Social Gatherings with Friends and Family: More than three times a week     Attends Religious Services: More than 4 times per year    Active Member of [employer] or Organizations: Yes    Attends Banker Meetings: More than 4 times per year    Marital Status: Widowed   Past Surgical History:  Procedure Laterality Date   ABDOMINAL HYSTERECTOMY     ANTERIOR CERVICAL DECOMP/DISCECTOMY FUSION N/A 05/24/2020   Procedure: ANTERIOR CERVICAL DECOMPRESSION/DISCECTOMY FUSION 1 LEVEL C3-4;  Surgeon: Venita Lick, MD;  Location: [facility] OR;  Service: Orthopedics;  Laterality: N/A;  3 hrs   BILIARY STENT PLACEMENT N/A 12/12/2015   Procedure: BILIARY STENT PLACEMENT;  Surgeon: Sherrilyn Rist, MD;  Location: [facility] ENDOSCOPY;  Service: Endoscopy;  Laterality: N/A;   BLADDER REPAIR     CARPAL TUNNEL RELEASE     CERVICAL FUSION     CERVICAL SPINE SURGERY     CHOLECYSTECTOMY N/A 12/06/2015   Procedure: LAPAROSCOPIC CHOLECYSTECTOMY WITH  INTRAOPERATIVE CHOLANGIOGRAM;  Surgeon: Almond Lint, MD;  Location: [facility] OR;  Service: General;  Laterality: N/A;   ELBOW SURGERY     ERCP N/A 12/12/2015   Procedure: ENDOSCOPIC RETROGRADE CHOLANGIOPANCREATOGRAPHY (ERCP);  Surgeon: Sherrilyn Rist, MD;  Location[facility] ENDOSCOPY;  Service: Endoscopy;  Laterality: N/A;   ESOPHAGOGASTRODUODENOSCOPY N/A 01/28/2016   Procedure: ESOPHAGOGASTRODUODENOSCOPY (EGD) Biliary STENT removal;  Surgeon: Sherrilyn Rist, MD;  Location: [facility] ENDOSCOPY;  Service: Gastroenterology;  Laterality: N/A;   IR GENERIC HISTORICAL  12/17/2015   IR US GUIDE BX ASP/DRAIN 12/17/2015 [facility]   IR GENERIC HISTORICAL  12/17/2015   IR GUIDED DRAIN W CATHETER PLACEMENT 12/17/2015 [facility]   IR GENERIC HISTORICAL  12/17/2015   IR SINUS/FIST TUBE CHK-NON GI 12/17/2015 [facility]   KNEE SURGERY     LUNG SURGERY     TEE WITHOUT CARDIOVERSION N/A 12/19/2015   Procedure: TRANSESOPHAGEAL ECHOCARDIOGRAM (TEE);  Surgeon: Wendall Stade, MD;  Location: [facility] ENDOSCOPY;  Service: Cardiovascular;  Laterality: N/A;   Past Surgical  History:  Procedure Laterality Date   ABDOMINAL HYSTERECTOMY     ANTERIOR CERVICAL DECOMP/DISCECTOMY FUSION N/A 05/24/2020   Procedure: ANTERIOR CERVICAL DECOMPRESSION/DISCECTOMY FUSION 1 LEVEL C3-4;  Surgeon: Venita Lick, MD;  Location: [facility] OR;  Service: Orthopedics;  Laterality: N/A;  3 hrs   BILIARY STENT PLACEMENT N/A 12/12/2015   Procedure: BILIARY STENT PLACEMENT;  Surgeon: Sherrilyn Rist, MD;  Location: [facility] ENDOSCOPY;  Service: Endoscopy;  Laterality: N/A;   BLADDER REPAIR     CARPAL TUNNEL RELEASE     CERVICAL FUSION     CERVICAL SPINE SURGERY     CHOLECYSTECTOMY N/A 12/06/2015   Procedure: LAPAROSCOPIC CHOLECYSTECTOMY WITH  INTRAOPERATIVE CHOLANGIOGRAM;  Surgeon: Almond Lint, MD;  [facility] OR;  Service: General;  Laterality: N/A;   ELBOW SURGERY     ERCP N/A 12/12/2015   Procedure: ENDOSCOPIC RETROGRADE CHOLANGIOPANCREATOGRAPHY (ERCP);  Surgeon: Sherrilyn Rist, MD;  Location: [facility] ENDOSCOPY;  Service: Endoscopy;  Laterality: N/A;   ESOPHAGOGASTRODUODENOSCOPY N/A 01/28/2016   Procedure: ESOPHAGOGASTRODUODENOSCOPY (EGD) Biliary STENT removal;  Surgeon: Sherrilyn Rist, MD;  Location: [facility] ENDOSCOPY;  Service: Gastroenterology;  Laterality: N/A;   IR GENERIC HISTORICAL  12/17/2015   IR US GUIDE BX ASP/DRAIN 12/17/2015 [facility]   IR GENERIC HISTORICAL  12/17/2015   IR GUIDED DRAIN W CATHETER PLACEMENT 12/17/2015 [facility]   IR GENERIC HISTORICAL  12/17/2015   IR SINUS/FIST TUBE CHK-NON GI 12/17/2015 [facility]   KNEE SURGERY     LUNG SURGERY     TEE WITHOUT CARDIOVERSION N/A 12/19/2015   Procedure: TRANSESOPHAGEAL ECHOCARDIOGRAM (TEE);  Surgeon: Wendall Stade, MD;  Location: [facility] ENDOSCOPY;  Service: Cardiovascular;  Laterality: N/A;   Past Medical History:  Diagnosis Date   Anemia    Cervical neuritis    Chronic kidney disease    stage 3   Dyslipidemia    GERD (gastroesophageal reflux disease)    History of transient ischemic attack (TIA)    HTN (hypertension)     Hyperlipidemia    Migraine    Migraine headache    Occipital neuralgia    Left   Pulmonary embolism (HCC) 2017   Renal insufficiency    Stroke (HCC) 10/2013   BP (!) 167/76   Pulse 92   Ht 5\' 6"  (1.676 m)   Wt 174 lb (78.9 kg)   SpO2 94%   BMI 28.08 kg/m   Opioid Risk Score:   Fall Risk Score:  `1  Depression screen [facility] 2/9     04/14/2023    8:41 AM 02/17/2023    8:12 AM 02/03/2023    3:32 PM 01/29/2023    9:23 AM 12/29/2022    4:15 PM 12/26/2022    9:28 AM 12/16/2022    9:26 AM  Depression screen PHQ 2/9  Decreased Interest 1 1 1 1 1  0 1  Down, Depressed, Hopeless 2 0 1 1 1  0 1  PHQ - 2 Score 3 1 2 2 2  0 2  Altered sleeping 1 1 0  1  2  Tired, decreased energy 1 1 1  2  2   Change in appetite 1 1 1  1  2   Feeling bad or failure about yourself  0 0 0  0  0  Trouble concentrating 0 0 0  0  1  Moving slowly or fidgety/restless 0 0 0  0  1  Suicidal thoughts 0 0 0  0  0  PHQ-9 Score 6 4 4  6  10   Difficult doing work/chores Not difficult at all Not difficult at all Not difficult at all         Review of Systems  Musculoskeletal:  Positive for back pain.  All other systems reviewed and are negative.      Objective:   Physical Exam General No acute distress Mood and affect appropriate Lumbar spine has some tenderness at the lumbosacral junction on the right side.  Pain with extension lumbar spine without flexion.  No pain with twisting. Negative straight leg raise bilaterally  .  Motor strength is 5/5 bilateral hip flexor knee extensor ankle dorsiflexor Ambulates without assistive device no evidence toe drag or knee instability.  She does have a slightly forward flexed posture       Assessment & Plan:   #1.  Lumbar spondylosis without myelopathy feel like the patient is doing well with left-sided pain however the right side pain has recurred after medial branch blocks right L3-4 as well as right L5 dorsal ramus injection under fluoroscopic guidance.  She  did get 100% relief and  longer than expected duration of response over 1 month.  Will repeat and if greater than 80% relief, would schedule for RFA of the same nerves Continue tramadol 50 mg once a day Continue gabapentin 600 mg twice a day

## 2023-05-30 ENCOUNTER — Other Ambulatory Visit: Payer: Self-pay | Admitting: Physical Medicine & Rehabilitation

## 2023-06-02 ENCOUNTER — Other Ambulatory Visit (HOSPITAL_BASED_OUTPATIENT_CLINIC_OR_DEPARTMENT_OTHER): Payer: Self-pay | Admitting: Cardiovascular Disease

## 2023-06-02 DIAGNOSIS — E785 Hyperlipidemia, unspecified: Secondary | ICD-10-CM

## 2023-06-02 DIAGNOSIS — I25118 Atherosclerotic heart disease of native coronary artery with other forms of angina pectoris: Secondary | ICD-10-CM

## 2023-06-02 DIAGNOSIS — Z79899 Other long term (current) drug therapy: Secondary | ICD-10-CM

## 2023-06-03 ENCOUNTER — Ambulatory Visit (INDEPENDENT_AMBULATORY_CARE_PROVIDER_SITE_OTHER): Payer: Medicare Other | Admitting: Family Medicine

## 2023-06-03 ENCOUNTER — Encounter: Payer: Self-pay | Admitting: Family Medicine

## 2023-06-03 VITALS — BP 120/73 | HR 76 | Ht 66.0 in | Wt 178.5 lb

## 2023-06-03 DIAGNOSIS — G43009 Migraine without aura, not intractable, without status migrainosus: Secondary | ICD-10-CM | POA: Diagnosis not present

## 2023-06-03 DIAGNOSIS — G4733 Obstructive sleep apnea (adult) (pediatric): Secondary | ICD-10-CM

## 2023-06-03 DIAGNOSIS — G4700 Insomnia, unspecified: Secondary | ICD-10-CM | POA: Diagnosis not present

## 2023-06-03 MED ORDER — ZONISAMIDE 100 MG PO CAPS: 100.0000 mg | ORAL_CAPSULE | Freq: Two times a day (BID) | ORAL | 3 refills | Status: DC

## 2023-06-03 MED ORDER — DOXEPIN HCL 100 MG PO CAPS: 100.0000 mg | ORAL_CAPSULE | Freq: Every day | ORAL | 1 refills | Status: DC

## 2023-06-03 MED ORDER — ZONISAMIDE 25 MG PO CAPS
ORAL_CAPSULE | ORAL | 0 refills | Status: AC
Start: 2023-06-03 — End: ?

## 2023-06-03 MED ORDER — NURTEC 75 MG PO TBDP: 75.0000 mg | ORAL_TABLET | Freq: Every day | ORAL | 11 refills | Status: AC | PRN

## 2023-06-09 ENCOUNTER — Other Ambulatory Visit (HOSPITAL_COMMUNITY): Payer: Self-pay

## 2023-06-09 ENCOUNTER — Telehealth: Payer: Self-pay

## 2023-06-09 ENCOUNTER — Encounter: Payer: Self-pay | Admitting: Cardiovascular Disease

## 2023-06-09 NOTE — Telephone Encounter (Signed)
 Pharmacy Patient Advocate Encounter   Received notification from Fax that prior authorization for Nurtec is due for renewal.   Insurance verification completed.   The patient is insured through [facility].  Action: Refill too soon. PA is not needed at this time. Medication was filled 06/03/2023. Next eligible fill date is 06/25/2023.

## 2023-06-24 NOTE — Progress Notes (Unsigned)
  PROCEDURE RECORD [facility]   Name: [name] DOB:10-Mar-1951 MRN: [id number]  Date:06/24/2023  Physician: Janeece Mechanic, MD    Nurse/CMA: Cathy Cobbs RN  Allergies:  Allergies  Allergen Reactions   Iodinated Contrast Media Shortness Of Breath and Itching    States she had this reaction connected with a MRI of the cervical spine.   Shellfish Allergy Anaphylaxis    All seafood   Crestor [Rosuvastatin] Other (See Comments)    Anxiety, itching, headaches, myalgias Able to tolerate 5mg  daily   Lipitor [Atorvastatin] Other (See Comments)    Anxiety, itching, headaches, myalgias   Statins Other (See Comments)    Arthralgias (severe) with atorvastatin, mild arthralgias with rosuvastatin but willing to continue taking it   Iodine     Consent Signed: {yes [id number]}  Is patient diabetic? {yes no:314532}  CBG today? ***  Pregnant: {yes no:314532} LMP: No LMP recorded. Patient has had a hysterectomy. (age [age])  Anticoagulants: {Yes/No:19989} Anti-inflammatory: {Yes/No:19989} Antibiotics: {Yes/No:19989}  Procedure: RIGHT L 3-4 MEDIAL BRANCH BLOCK   Position: Prone Start Time: ***  End Time: ***  Fluoro Time: ***  RN/CMA Cruzita Lipa RN Ixchel Duck RN    Time *** ***    BP *** ***    Pulse *** ***    Respirations *** ***    O2 Sat *** ***    S/S *** ***    Pain Level *** ***     D/C home with ***, [name] & O X 3, D/C instructions reviewed, and sits independently.

## 2023-06-25 ENCOUNTER — Encounter: Attending: Physical Medicine & Rehabilitation | Admitting: Physical Medicine & Rehabilitation

## 2023-06-25 ENCOUNTER — Encounter: Payer: Self-pay | Admitting: Physical Medicine & Rehabilitation

## 2023-06-25 VITALS — BP 135/78 | HR 68 | Temp 98.0°F | Ht 66.0 in | Wt 178.0 lb

## 2023-06-25 DIAGNOSIS — M47816 Spondylosis without myelopathy or radiculopathy, lumbar region: Secondary | ICD-10-CM | POA: Insufficient documentation

## 2023-06-25 MED ORDER — LIDOCAINE HCL 1 % IJ SOLN
10.0000 mL | Freq: Once | INTRAMUSCULAR | Status: AC
  Administered 2023-06-25: 10 mL

## 2023-06-25 MED ORDER — LIDOCAINE HCL (PF) 2 % IJ SOLN
2.0000 mL | Freq: Once | INTRAMUSCULAR | Status: AC
  Administered 2023-06-25: 2 mL

## 2023-06-25 NOTE — Patient Instructions (Signed)
Lumbar medial branch blocks were performed. This is to help diagnose the cause of the low back pain. It is important that you keep track of your pain for the first day or 2 after injection. This injection can give you temporary relief that lasts for hours or up to several months. There is no way to predict duration of pain relief.  Please try to compare your pain after injection to for the injection.  If this injection gives you  temporary relief there may be another longer-lasting procedure that may be beneficial call radiofrequency ablation 

## 2023-06-25 NOTE — Progress Notes (Signed)
 Right lumbar L3, L4 medial branch blocks and L5 dorsal ramus injection under fluoroscopic guidance  Indication: Right Lumbar pain which is not relieved by medication management or other conservative care and interfering with self-care and mobility.  Informed consent was obtained after describing risks and benefits of the procedure with the patient, this includes bleeding, bruising, infection, paralysis and medication side effects. The patient wishes to proceed and has given written consent. The patient was placed in a prone position. The lumbar area was marked and prepped with Betadine. One ML of 1% lidocaine was injected into each of 3 areas into the skin and subcutaneous tissue. Then a 22-gauge 3.5in spinal needle was inserted targeting the junction of the Right S1 superior articular process and sacral ala junction. Needle was advanced under fluoroscopic guidance. Bone contact was made.. Then a solution containing 2% MPF lidocaine was injected x0.5 mL. Then the Right L5 superior articular process in transverse process junction was targeted.. Then the Right L4 superior articular process in transverse process junction was targeted. Lateral views confirmed needle placement Bone contact was made.  Patient tolerated procedure well. Post procedure instructions were given. Please refer to post procedure form.

## 2023-07-02 ENCOUNTER — Other Ambulatory Visit: Payer: Self-pay | Admitting: Student

## 2023-07-02 DIAGNOSIS — I10 Essential (primary) hypertension: Secondary | ICD-10-CM

## 2023-07-03 ENCOUNTER — Telehealth: Payer: Self-pay

## 2023-07-03 ENCOUNTER — Other Ambulatory Visit (HOSPITAL_COMMUNITY): Payer: Self-pay

## 2023-07-03 ENCOUNTER — Encounter: Payer: Self-pay | Admitting: Cardiovascular Disease

## 2023-07-03 NOTE — Telephone Encounter (Signed)
 Pharmacy Patient Advocate Encounter   Received notification from CoverMyMeds that prior authorization for Nurtec 75MG  dispersible tablets is required/requested.   Insurance verification completed.   The patient is insured through [facility] .   Per test claim: PA required; PA submitted to above mentioned insurance via CoverMyMeds Key/confirmation #/EOC [id number] Status is pending

## 2023-07-03 NOTE — Telephone Encounter (Signed)
 Pharmacy Patient Advocate Encounter  Received notification from [facility] Medicare that Prior Authorization for Nurtec 75MG  dispersible tablets has been APPROVED from 06-18-2024 to 03-09-2098   PA #/Case ID/Reference #: [id number]

## 2023-07-07 ENCOUNTER — Encounter: Payer: Self-pay | Admitting: Student

## 2023-07-07 ENCOUNTER — Ambulatory Visit (INDEPENDENT_AMBULATORY_CARE_PROVIDER_SITE_OTHER): Admitting: Student

## 2023-07-07 VITALS — BP 97/63 | HR 66 | Temp 98.6°F | Ht 66.0 in | Wt 177.8 lb

## 2023-07-07 DIAGNOSIS — E860 Dehydration: Secondary | ICD-10-CM | POA: Diagnosis not present

## 2023-07-07 DIAGNOSIS — R0981 Nasal congestion: Secondary | ICD-10-CM

## 2023-07-07 DIAGNOSIS — J069 Acute upper respiratory infection, unspecified: Secondary | ICD-10-CM | POA: Diagnosis not present

## 2023-07-07 DIAGNOSIS — I10 Essential (primary) hypertension: Secondary | ICD-10-CM

## 2023-07-07 NOTE — Assessment & Plan Note (Addendum)
 Likely viral etiology. No fever or GI symptoms since Friday. Lungs clear, adequate oxygen saturation. Low concern for  pneumonia given no focal lung sounds and only on day 3 of respiratory symptoms. BP on lower end is concerning for dehydration s/p likely gastroenteritis.  - swabs obtained for flu and COVID - Encourage fluid intake and rest. - Use acetaminophen  for pain or fever. - Return if symptoms persist beyond 7 days or worsen. - go to ED if having trouble breathing - provided handout on URIs

## 2023-07-07 NOTE — Patient Instructions (Addendum)
 It was great to see you! Thank you for allowing me to participate in your care!  I recommend that you always bring your medications to each appointment as this makes it easy to ensure you are on the correct medications and helps us  not miss when refills are needed.  Our plans for today:  - Stop losartan  for now, check blood pressure at home, can resume once blood pressure is >120/65 - If you feel very light headed like you may pass out, return or go to the ED  - return if you develop difficulty breathing or are acutely worse - let me know if symptoms persist beyond 7 days  - return next week for follow up   Take care and seek immediate care sooner if you develop any concerns.   Dr. Glenn Lange, DO [facility]

## 2023-07-07 NOTE — Progress Notes (Signed)
    SUBJECTIVE:   CHIEF COMPLAINT / HPI:   [name] "[name]" is a 73 year old female who presents with gastrointestinal and respiratory symptoms.  Symptoms began last Wednesday (7 days ago) with severe vomiting and diarrhea, which resolved by Friday. Vomiting was severe, leading to dry heaves, but no blood was present. She has been consuming electrolyte drinks. Nausea persists, affecting her ability to eat, though she can keep fluids down. A fever of 101F was present from Wednesday to Friday, resolving with the cessation of gastrointestinal symptoms.  Since Sunday (3 days ago), she has developed a productive cough and sinus congestion/pressure with yellow sputum. She has a history of sinusitis. There is no chest pain. No fever has been noted since the onset of respiratory symptoms. She feels very tired and lacks energy and notes occasional mild light headedness while being sick but none currently.  Also has a mild headache while being sick, has been taking her migraine medications.  Her son experienced similar gastrointestinal symptoms.   PERTINENT  PMH / PSH: Polypharmacy, migraines, sinusitis, HTN  OBJECTIVE:   BP 97/63   Pulse 66   Temp 98.6 F (37 C)   Ht 5\' 6"  (1.676 m)   Wt 177 lb 12.8 oz (80.6 kg)   SpO2 99%   BMI 28.70 kg/m    General: NAD, pleasant, well-appearing HEENT: White sclera, clear conjunctiva, dry MM, no erythema or exudate of oropharynx, pressure with palpation of maxillary sinuses Cardiac: RRR, no murmurs. Respiratory: CTAB, normal effort, No wheezes, rales or rhonchi Extremities: no edema BLEs Skin: warm and dry Neuro: alert, no obvious focal deficits Psych: Normal affect and mood  ASSESSMENT/PLAN:   Upper respiratory tract infection Likely viral etiology. No fever or GI symptoms since Friday. Lungs clear, adequate oxygen saturation. Low concern for  pneumonia given no focal lung sounds and only on day 3 of respiratory symptoms. BP on lower end is  concerning for dehydration s/p likely gastroenteritis.  - swabs obtained for flu and COVID - Encourage fluid intake and rest. - Use acetaminophen  for pain or fever. - Return if symptoms persist beyond 7 days or worsen. - go to ED if having trouble breathing - provided handout on URIs    Dehydration I suspect mild dehydration is contributing to lower BP and likely due to recent gastroenteritis and URI. Tolerating fluids now. - Encourage mindful and regular fluid intake. - Check BMP today   Essential hypertension BP on lower end today in setting of likely viral illness and dehydration -hold losartan , can check BP at home and restart when BP is >120/65   F/u in 1 week to recheck BP and monitor sick symptoms   Dr. Glenn Lange, DO [facility]

## 2023-07-07 NOTE — Assessment & Plan Note (Signed)
 BP on lower end today in setting of likely viral illness and dehydration -hold losartan , can check BP at home and restart when BP is >120/65

## 2023-07-07 NOTE — Assessment & Plan Note (Signed)
 I suspect mild dehydration is contributing to lower BP and likely due to recent gastroenteritis and URI. Tolerating fluids now. - Encourage mindful and regular fluid intake. - Check BMP today

## 2023-07-08 ENCOUNTER — Encounter: Payer: Self-pay | Admitting: Student

## 2023-07-08 LAB — BASIC METABOLIC PANEL WITH GFR
BUN/Creatinine Ratio: 18 (ref 12–28)
BUN: 19 mg/dL (ref 8–27)
CO2: 24 mmol/L (ref 20–29)
Calcium: 9.4 mg/dL (ref 8.7–10.3)
Chloride: 102 mmol/L (ref 96–106)
Creatinine, Ser: 1.08 mg/dL — ABNORMAL HIGH (ref 0.57–1.00)
Glucose: 106 mg/dL — ABNORMAL HIGH (ref 70–99)
Potassium: 4.5 mmol/L (ref 3.5–5.2)
Sodium: 142 mmol/L (ref 134–144)
eGFR: 55 mL/min/{1.73_m2} — ABNORMAL LOW (ref >59)

## 2023-07-09 ENCOUNTER — Other Ambulatory Visit: Payer: Self-pay | Admitting: Student

## 2023-07-09 ENCOUNTER — Encounter: Payer: Self-pay | Admitting: Student

## 2023-07-09 ENCOUNTER — Telehealth: Payer: Self-pay | Admitting: Family Medicine

## 2023-07-09 ENCOUNTER — Telehealth: Payer: Self-pay

## 2023-07-09 DIAGNOSIS — J320 Chronic maxillary sinusitis: Secondary | ICD-10-CM

## 2023-07-09 LAB — COVID-19, FLU A+B NAA
Influenza A, NAA: NOT DETECTED
Influenza B, NAA: NOT DETECTED
SARS-CoV-2, NAA: NOT DETECTED

## 2023-07-09 MED ORDER — TIZANIDINE HCL 4 MG PO TABS: 4.0000 mg | ORAL_TABLET | Freq: Four times a day (QID) | ORAL | 1 refills | Status: DC | PRN

## 2023-07-09 MED ORDER — AMOXICILLIN-POT CLAVULANATE 875-125 MG PO TABS: 1.0000 | ORAL_TABLET | Freq: Two times a day (BID) | ORAL | 0 refills | Status: DC

## 2023-07-09 NOTE — Telephone Encounter (Signed)
 Returned call to patient and provided with message from Dr. Rochelle Chu.   Patient appreciative. Discussed return precautions.   Elsie Halo, RN

## 2023-07-09 NOTE — Telephone Encounter (Addendum)
 Amy- I dont see in last note that she was to continue. Ok to refill?  Last seen 06/03/23 and next f/u 01/06/24.

## 2023-07-09 NOTE — Telephone Encounter (Signed)
 Attempted to call pt regarding duration of symptoms concerning for sinusitis. She called nurse line stating she was mistaken and that symptoms have been present for over 10 days.  Augmentin  sent to pharmacy for likely bacterial sinusitis given duration. Will ask RN to call patient and let her know and to return if symptoms worsen/do not improve.

## 2023-07-09 NOTE — Telephone Encounter (Signed)
 Pt is requesting a refill for tiZANidine  (ZANAFLEX ) 4 MG tablet.  Pharmacy: [facility] [phone]

## 2023-07-09 NOTE — Telephone Encounter (Signed)
 Patient calls nurse regarding fever returning last night.   Reports Tmax of 102. Treated with tylenol . She states that current temperature is 99.0.   She also wanted to make provider aware of timeline error regarding her symptoms.   She reports that symptoms have actually been present since Easter Sunday (06/28/23) not this past Sunday (4/27).   She is [facility] [address].   Please advise.   Elsie Halo, RN

## 2023-07-13 ENCOUNTER — Ambulatory Visit
Admission: RE | Admit: 2023-07-13 | Discharge: 2023-07-13 | Disposition: A | Discharge status: Home / Self Care | Source: Ambulatory Visit | Attending: Family Medicine | Admitting: Family Medicine

## 2023-07-13 ENCOUNTER — Ambulatory Visit (INDEPENDENT_AMBULATORY_CARE_PROVIDER_SITE_OTHER): Admitting: Family Medicine

## 2023-07-13 VITALS — BP 98/64 | HR 70 | Ht 66.0 in | Wt 186.4 lb

## 2023-07-13 DIAGNOSIS — R509 Fever, unspecified: Secondary | ICD-10-CM | POA: Diagnosis not present

## 2023-07-13 DIAGNOSIS — R042 Hemoptysis: Secondary | ICD-10-CM | POA: Diagnosis not present

## 2023-07-13 DIAGNOSIS — J90 Pleural effusion, not elsewhere classified: Secondary | ICD-10-CM | POA: Diagnosis not present

## 2023-07-13 DIAGNOSIS — R300 Dysuria: Secondary | ICD-10-CM | POA: Diagnosis not present

## 2023-07-13 DIAGNOSIS — I509 Heart failure, unspecified: Secondary | ICD-10-CM | POA: Diagnosis not present

## 2023-07-13 LAB — POCT URINALYSIS DIP (MANUAL ENTRY)
Bilirubin, UA: NEGATIVE
Blood, UA: NEGATIVE
Glucose, UA: NEGATIVE mg/dL
Ketones, POC UA: NEGATIVE mg/dL
Leukocytes, UA: NEGATIVE
Nitrite, UA: NEGATIVE
Protein Ur, POC: NEGATIVE mg/dL
Spec Grav, UA: 1.015 (ref 1.010–1.025)
Urobilinogen, UA: 0.2 U/dL
pH, UA: 5.5 (ref 5.0–8.0)

## 2023-07-13 MED ORDER — AMOXICILLIN-POT CLAVULANATE 875-125 MG PO TABS
1.0000 | ORAL_TABLET | Freq: Two times a day (BID) | ORAL | 0 refills | Status: AC
Start: 2023-07-13 — End: ?

## 2023-07-13 NOTE — Patient Instructions (Signed)
 It was great to see you! Thank you for allowing me to participate in your care!  Our plans for today:  - I refilled your antibiotics for the next 5 days as well. You can pick this up at your pharmacy. - Please go get a CXR at the imaging center after this appointment. - I will let you know the results of your lab work.   Please arrive 15 minutes PRIOR to your next scheduled appointment time! If you do not, this affects OTHER patients' care.  Take care and seek immediate care sooner if you develop any concerns.   Ivin Marrow, MD, PGY-2 [facility] 9:49 AM 07/13/2023  [facility]

## 2023-07-13 NOTE — Telephone Encounter (Signed)
 Patient returns call to nurse line. She states that fever returned over the weekend. Tmax 101. She also reports small amount of blood in sputum.   Now she is also experiencing back pain and is concerned about possible UTI.   Scheduled patient for follow up visit this morning.   ED precautions discussed.   Elsie Halo, RN

## 2023-07-13 NOTE — Progress Notes (Signed)
    SUBJECTIVE:   CHIEF COMPLAINT / HPI: fever, continued back pain  Since easter Sunday, coughing and back pain. Sore "all over". 2 weeks had vomiting and diarrhea for 3 days. Had fever last week. Saw Dr. Rochelle Chu, and was prescribed antibiotics. Cough now has blood in it. Says is not a lot. Back pain started last week. Drinking normally, not eating as much as normal. No travel or trips recently. Some increased swelling in bilateral legs, no pain. Last antibiotic pill finishes tonight. Last fever was 101 this AM. Had tylenol  this morning which helped. Back is on left side, makes difficult to be comfortable. Radiates upwords. Has burning with urination, and frequency. Ongoing since Friday.   PERTINENT  PMH / PSH: HTN, Migrianes, Hx of TIA, CAD, PVD, Hx of PE  OBJECTIVE:   BP 98/64   Pulse 70   Ht 5\' 6"  (1.676 m)   Wt 186 lb 6 oz (84.5 kg)   SpO2 95%   BMI 30.08 kg/m   General: NAD, tired appearing Neuro: A&O HEENT: posterior oropharyngeal erythema Cardiovascular: RRR, no murmurs,  Respiratory: normal WOB on RA, CTAB, no wheezes, ronchi or rales Abdomen: soft, NTTP, no rebound or guarding, no CVA tenderness Back: bilateral tenderness to palpation along paraspinal muscles, negative straight leg raise bilaterally Extremities: Moving all 4 extremities equally, 1+ peripheral edema bilaterally   ASSESSMENT/PLAN:   Assessment & Plan Fever, unspecified fever cause Unclear cause of fever over the past several weeks.  Multiple clinical signs of pneumonia versus bacterial sinusitis versus now likely new urinary tract infection.  Cough history is further complicated by extensive smoking history and previous history of PE. I suspect that her hemoptysis is likely due to bronchitis, do not think a CT scan is clinically indicated at this time to rule out PE or malignancy.  Reported history of COPD however given smoking history this may be contributing.  Would not be unreasonable at  follow-up to consider steroid burst and initiating prn inhaler.  Given urinary symptoms will rule out new UTI versus pyelonephritis; however, it is odd as he has been on Augmentin  for 4 days without improvement at this time.  Will evaluate for worsening pneumonia. - Chest x-ray - CRP, BMP, CBC - UA, urine culture - Extend Augmentin  course to 10 days, refilled for another 5 days  Return in about 4 days (around 07/17/2023) for f/u.  Some or all of this note was dictated use Dragon software, there may be unintentional errors present.  Precepted with Dr. Howard Macho.  Ivin Marrow, MD [facility]

## 2023-07-14 LAB — URINALYSIS
Bilirubin, UA: NEGATIVE
Glucose, UA: NEGATIVE
Ketones, UA: NEGATIVE
Leukocytes,UA: NEGATIVE
Nitrite, UA: NEGATIVE
Protein,UA: NEGATIVE
RBC, UA: NEGATIVE
Specific Gravity, UA: 1.016 (ref 1.005–1.030)
Urobilinogen, Ur: 0.2 mg/dL (ref 0.2–1.0)
pH, UA: 5.5 (ref 5.0–7.5)

## 2023-07-14 LAB — BASIC METABOLIC PANEL WITH GFR
BUN/Creatinine Ratio: 12 (ref 12–28)
BUN: 13 mg/dL (ref 8–27)
CO2: 22 mmol/L (ref 20–29)
Calcium: 8.7 mg/dL (ref 8.7–10.3)
Chloride: 104 mmol/L (ref 96–106)
Creatinine, Ser: 1.1 mg/dL — ABNORMAL HIGH (ref 0.57–1.00)
Glucose: 91 mg/dL (ref 70–99)
Potassium: 4.5 mmol/L (ref 3.5–5.2)
Sodium: 143 mmol/L (ref 134–144)
eGFR: 54 mL/min/1.73 — ABNORMAL LOW

## 2023-07-14 LAB — CBC
Hematocrit: 38.5 % (ref 34.0–46.6)
Hemoglobin: 12.4 g/dL (ref 11.1–15.9)
MCH: 30.1 pg (ref 26.6–33.0)
MCHC: 32.2 g/dL (ref 31.5–35.7)
MCV: 93 fL (ref 79–97)
Platelets: 265 10*3/uL (ref 150–450)
RBC: 4.12 x10E6/uL (ref 3.77–5.28)
RDW: 13.9 % (ref 11.7–15.4)
WBC: 9.1 10*3/uL (ref 3.4–10.8)

## 2023-07-14 LAB — C-REACTIVE PROTEIN: CRP: 28 mg/L — ABNORMAL HIGH (ref 0–10)

## 2023-07-15 ENCOUNTER — Telehealth: Payer: Self-pay | Admitting: Family Medicine

## 2023-07-15 ENCOUNTER — Ambulatory Visit: Admitting: Student

## 2023-07-15 LAB — URINE CULTURE: Organism ID, Bacteria: NO GROWTH

## 2023-07-15 NOTE — Telephone Encounter (Signed)
 Called patient to update regarding x-ray results and lab results.  Patient does not have voicemail set up.  Called patient's son left HIPAA compliant voicemail.  Will try again.  Attempt #1.

## 2023-07-16 ENCOUNTER — Ambulatory Visit: Payer: Medicare Other

## 2023-07-16 VITALS — Ht 66.0 in | Wt 174.0 lb

## 2023-07-16 DIAGNOSIS — Z0000 Encounter for general adult medical examination without abnormal findings: Secondary | ICD-10-CM | POA: Diagnosis not present

## 2023-07-16 NOTE — Telephone Encounter (Signed)
 Patient LVM on nurse line reporting she missed a call.   Will forward to provider who called patient for results.

## 2023-07-16 NOTE — Progress Notes (Addendum)
 Because this visit was a virtual/telehealth visit,  certain criteria was not obtained, such a blood pressure, CBG if applicable, and timed get up and go. Any medications not marked as "taking" were not mentioned during the medication reconciliation part of the visit. Any vitals not documented were not able to be obtained due to this being a telehealth visit or patient was unable to self-report a recent blood pressure reading due to a lack of equipment at home via telehealth. Vitals that have been documented are verbally provided by the patient.   Subjective:   [name] is a 73 y.o. who presents for a Medicare Wellness preventive visit.  Visit Complete: Virtual I connected with  SANITA SCHEPPS on 07/16/23 by a audio enabled telemedicine application and verified that I am speaking with the correct person using two identifiers.  Patient Location: Home  Provider Location: Office/Clinic  I discussed the limitations of evaluation and management by telemedicine. The patient expressed understanding and agreed to proceed.  Vital Signs: Because this visit was a virtual/telehealth visit, some criteria may be missing or patient reported. Any vitals not documented were not able to be obtained and vitals that have been documented are patient reported.  VideoDeclined- This patient declined Librarian, academic. Therefore the visit was completed with audio only.  Persons Participating in Visit: Patient.  AWV Questionnaire: Yes: Patient Medicare AWV questionnaire was completed by the patient on 07/12/2023; I have confirmed that all information answered by patient is correct and no changes since this date.  Cardiac Risk Factors include: advanced age (>3men, >58 women);dyslipidemia;family history of premature cardiovascular disease;hypertension     Objective:    Today's Vitals   07/16/23 0914  Weight: 174 lb (78.9 kg)  Height: 5\' 6"  (1.676 m)  PainSc: 8   PainLoc:  Head   Body mass index is 28.08 kg/m.     07/16/2023    9:16 AM 07/13/2023    9:26 AM 07/07/2023   11:03 AM 07/07/2023   10:55 AM 04/14/2023    8:42 AM 02/17/2023    8:12 AM 02/03/2023    3:33 PM  Advanced Directives  Does Patient Have a Medical Advance Directive? Yes No No No No Yes No  Type of Estate agent of [place];Living will     Healthcare Power of Attorney   Copy of Healthcare Power of Attorney in Chart? No - copy requested        Would patient like information on creating a medical advance directive? No - Patient declined No - Patient declined No - Patient declined No - Patient declined No - Patient declined  No - Patient declined    Current Medications (verified) Outpatient Encounter Medications as of 07/16/2023  Medication Sig   albuterol  (VENTOLIN  HFA) 108 (90 Base) MCG/ACT inhaler Inhale 2 puffs into the lungs every 6 (six) hours as needed for wheezing or shortness of breath.   amLODipine  (NORVASC ) 5 MG tablet TAKE 1 TABLET (5 MG TOTAL) BY MOUTH DAILY.   amoxicillin -clavulanate (AUGMENTIN ) 875-125 MG tablet Take 1 tablet by mouth 2 (two) times daily.   AREXVY 120 MCG/0.5ML injection    cetirizine  (ZYRTEC ) 10 MG tablet Take 1 tablet (10 mg total) by mouth daily with breakfast.   clopidogrel  (PLAVIX ) 75 MG tablet TAKE 1 TABLET BY MOUTH EVERY DAY   doxepin  (SINEQUAN ) 100 MG capsule Take 1 capsule (100 mg total) by mouth at bedtime.   fluticasone  (FLONASE ) 50 MCG/ACT nasal spray SPRAY 1 SPRAY  INTO BOTH NOSTRILS DAILY.   furosemide  (LASIX ) 40 MG tablet Take 1 tablet (40 mg total) by mouth daily.   gabapentin  (NEURONTIN ) 600 MG tablet TAKE 1 TABLET BY MOUTH THREE TIMES A DAY   inclisiran (LEQVIO ) 284 MG/1.5ML SOSY injection Inject as directed   losartan  (COZAAR ) 25 MG tablet TAKE 1 TABLET BY MOUTH EVERYDAY AT BEDTIME   ondansetron  (ZOFRAN ) 4 MG tablet Take 1 tablet (4 mg total) by mouth every 8 (eight) hours as needed for nausea or vomiting.   pantoprazole   (PROTONIX ) 40 MG tablet TAKE 1 TABLET(40 MG) BY MOUTH TWICE DAILY   Rimegepant Sulfate (NURTEC) 75 MG TBDP Take 1 tablet (75 mg total) by mouth daily as needed (take for abortive therapy of migraine, no more than 1 tablet in 24 hours or 10 per month).   rosuvastatin  (CRESTOR ) 5 MG tablet TAKE 1 TABLET (5 MG TOTAL) BY MOUTH DAILY.   sertraline  (ZOLOFT ) 100 MG tablet TAKE 1 TABLET BY MOUTH EVERY DAY   tiZANidine  (ZANAFLEX ) 4 MG tablet Take 1 tablet (4 mg total) by mouth every 6 (six) hours as needed for muscle spasms.   traMADol  (ULTRAM ) 50 MG tablet TAKE 1 TABLET EVERY 8 HOURS AS NEEDED FOR MODERATE PAIN   traZODone  (DESYREL ) 50 MG tablet TAKE 1/2 TO 1 TABLET BY MOUTH AT BEDTIME AS NEEDED FOR SLEEP   zonisamide  (ZONEGRAN ) 100 MG capsule Take 1 capsule (100 mg total) by mouth 2 (two) times daily.   zonisamide  (ZONEGRAN ) 25 MG capsule take 25 mg twice daily prn in addition to 100 mg twice daily.   No facility-administered encounter medications on file as of 07/16/2023.    Allergies (verified) Iodinated contrast media, Shellfish allergy, Crestor  [rosuvastatin ], Lipitor [atorvastatin ], Statins, and Iodine   History: Past Medical History:  Diagnosis Date   Anemia    Cervical neuritis    Chronic kidney disease    stage 3   Dyslipidemia    GERD (gastroesophageal reflux disease)    History of transient ischemic attack (TIA)    HTN (hypertension)    Hyperlipidemia    Migraine    Migraine headache    Occipital neuralgia    Left   Pulmonary embolism (HCC) 2017   Renal insufficiency    Stroke (HCC) 10/2013   Past Surgical History:  Procedure Laterality Date   ABDOMINAL HYSTERECTOMY     ANTERIOR CERVICAL DECOMP/DISCECTOMY FUSION N/A 05/24/2020   Procedure: ANTERIOR CERVICAL DECOMPRESSION/DISCECTOMY FUSION 1 LEVEL C3-4;  Surgeon: Mort Ards, MD;  Location: [facility] OR;  Service: Orthopedics;  Laterality: N/A;  3 hrs   BILIARY STENT PLACEMENT N/A 12/12/2015   Procedure: BILIARY STENT PLACEMENT;   Surgeon: Albertina Hugger, MD;  Location: [facility] ENDOSCOPY;  Service: Endoscopy;  Laterality: N/A;   BLADDER REPAIR     CARPAL TUNNEL RELEASE     CERVICAL FUSION     CERVICAL SPINE SURGERY     CHOLECYSTECTOMY N/A 12/06/2015   Procedure: LAPAROSCOPIC CHOLECYSTECTOMY WITH  INTRAOPERATIVE CHOLANGIOGRAM;  Surgeon: Lockie Rima, MD;  Location: [facility] OR;  Service: General;  Laterality: N/A;   ELBOW SURGERY     ERCP N/A 12/12/2015   Procedure: ENDOSCOPIC RETROGRADE CHOLANGIOPANCREATOGRAPHY (ERCP);  Surgeon: Albertina Hugger, MD;  Location: [facility] ENDOSCOPY;  Service: Endoscopy;  Laterality: N/A;   ESOPHAGOGASTRODUODENOSCOPY N/A 01/28/2016   Procedure: ESOPHAGOGASTRODUODENOSCOPY (EGD) Biliary STENT removal;  Surgeon: Albertina Hugger, MD;  Location: [facility] ENDOSCOPY;  Service: Gastroenterology;  Laterality: N/A;   IR GENERIC HISTORICAL  12/17/2015  IR US  GUIDE BX ASP/DRAIN 12/17/2015 [facility]   IR GENERIC HISTORICAL  12/17/2015   IR GUIDED DRAIN W CATHETER PLACEMENT 12/17/2015 [facility]   IR GENERIC HISTORICAL  12/17/2015   IR SINUS/FIST TUBE CHK-NON GI 12/17/2015 [facility]   KNEE SURGERY     LUNG SURGERY     TEE WITHOUT CARDIOVERSION N/A 12/19/2015   Procedure: TRANSESOPHAGEAL ECHOCARDIOGRAM (TEE);  Surgeon: Loyde Rule, MD;  Location: [facility] ENDOSCOPY;  Service: Cardiovascular;  Laterality: N/A;   Family History  Problem Relation Age of Onset   Cancer Mother 88       breast   Alzheimer's disease Mother    Prostate cancer Father    Lung cancer Father    Cancer Brother    Hyperlipidemia Brother    Sleep apnea Brother    Cancer Brother    Hyperlipidemia Brother    Dementia Brother        frontotemporal dementia   Dementia Maternal Grandmother    Cancer Maternal Grandfather    Social History   Socioeconomic History   Marital status: Married    Spouse name: [name]   Number of children: 1   Years of education: 12+   Highest education level: 12th grade  Occupational History   Occupation:  ACTIVITY DIRECTOR    Employer: [employer]   Occupation: CNA   Occupation: Retired  Tobacco Use   Smoking status: Former    Current packs/day: 0.00    Average packs/day: 1.5 packs/day for 42.6 years (64.0 ttl pk-yrs)    Types: Cigarettes    Start date: 03/10/1969    Quit date: 11/01/2011    Years since quitting: 11.7   Smokeless tobacco: Never   Tobacco comments:    Smoked 1.5-2 ppd for adult life (salem menthols)  Vaping Use   Vaping status: Never Used  Substance and Sexual Activity   Alcohol use: No    Alcohol/week: 0.0 standard drinks of alcohol   Drug use: No   Sexual activity: Yes    Partners: Male    Birth control/protection: Surgical    Comment: 1 sexual partner in last 12 months: married  Other Topics Concern   Not on file  Social History Narrative    Patient is married ([name]).   Patient has one child and grandchild. Her sons name is Myrtie Atkinson. Patient sees them often.   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 Patient spends 6-10 hours with her 6 days a week.    Patient has a college education.   Patient is very active in her church.    Hobbies- outside crafts and church choir.   Social Drivers of Health   Financial Resource Strain: Medium Risk (07/16/2023)   Overall Financial Resource Strain (CARDIA)    Difficulty of Paying Living Expenses: Somewhat hard  Food Insecurity: Food Insecurity Present (07/16/2023)   Hunger Vital Sign    Worried About Running Out of Food in the Last Year: Sometimes true    Ran Out of Food in the Last Year: Sometimes true  Transportation Needs: No Transportation Needs (07/16/2023)   PRAPARE - Administrator, Civil Service (Medical): No    Lack of Transportation (Non-Medical): No  Physical Activity: Insufficiently Active (07/16/2023)   Exercise Vital Sign    Days of Exercise per Week: 4 days    Minutes of Exercise per Session: 30 min  Stress: Stress Concern Present (07/16/2023)    [place]  [employer] of [employer]ational [employer] - Occupational Stress Questionnaire    Feeling of Stress : Very much  Social Connections: Moderately Integrated (07/16/2023)   Social Connection and Isolation Panel [NHANES]    Frequency of Communication with Friends and Family: Twice a week    Frequency of Social Gatherings with Friends and Family: Once a week    Attends Religious Services: More than 4 times per year    Active Member of [employer] or Organizations: No    Attends Engineer, structural: More than 4 times per year    Marital Status: Widowed    Tobacco Counseling Counseling given: Not Answered Tobacco comments: Smoked 1.5-2 ppd for adult life (salem menthols)    Clinical Intake:  Pre-visit preparation completed: Yes  Pain : 0-10 Pain Score: 8  Pain Type: Other (Comment) (SINUS INFECTION) Pain Location: Head     BMI - recorded: 28.08 Nutritional Risks: None Diabetes: No  Lab Results  Component Value Date   HGBA1C 5.5 07/30/2017   HGBA1C 5.9 (H) 10/10/2013     How often do you need to have someone help you when you read instructions, pamphlets, or other written materials from your doctor or pharmacy?: 1 - Never What is the last grade level you completed in school?: COLLEGE  Interpreter Needed?: No  Information entered by :: Druscilla Gerhard, LPN.   Activities of Daily Living     07/16/2023    9:28 AM 07/12/2023    9:41 AM  In your present state of health, do you have any difficulty performing the following activities:  Hearing? 1 1  Vision? 0 0  Difficulty concentrating or making decisions? 1 1  Walking or climbing stairs? 0 0  Dressing or bathing? 0 0  Doing errands, shopping? 0 0  Preparing Food and eating ? N N  Using the Toilet? N N  In the past six months, have you accidently leaked urine? Y Y  Do you have problems with loss of bowel control? N N  Managing your Medications? N N  Managing your Finances? N N  Housekeeping or managing your  Housekeeping? N N    Patient Care Team: Glenn Lange, DO as PCP - General (Family Medicine) Maudine Sos, MD as PCP - Cardiology (Cardiology) Key, Landa Pine, NP as Nurse Practitioner (Gynecology) Charley Constable, MD as Consulting Physician (Nephrology) Brian Campanile, MD (Inactive) as Consulting Physician (Neurology) Sharl Davies Cecilia Coe, MD as Consulting Physician (Physical Medicine and Rehabilitation) Ree Candy, MD (Family Medicine) Hutto, Jeannett Million, OD as Consulting Physician (Optometry) Ola Berger, Amy, NP as Nurse Practitioner (Family Medicine)  Indicate any recent Medical Services you may have received from other than [facility] providers in the past year (date may be approximate).     Assessment:    This is a routine wellness examination for [name].  Hearing/Vision screen Hearing Screening - Comments:: Denies hearing difficulties.  Vision Screening - Comments:: Wears reading glasses - up to date with routine eye exams with [facility] (Dr. Johnetta Nab)    Goals Addressed               This Visit's Progress     Patient Stated (pt-stated)        07/16/2023: To stay independent.       Depression Screen     07/16/2023    9:30 AM 07/13/2023    9:26 AM 07/07/2023   11:03 AM 07/07/2023   10:55 AM 06/25/2023   10:24 AM 04/14/2023    8:41 AM  02/17/2023    8:12 AM  PHQ 2/9 Scores  PHQ - 2 Score 3 3   2 3 1   PHQ- 9 Score 13 13    6 4   Exception Documentation   Patient refusal Patient refusal       Fall Risk     07/16/2023    9:17 AM 07/13/2023    9:27 AM 07/12/2023    9:41 AM 07/07/2023   10:55 AM 06/25/2023   10:23 AM  Fall Risk   Falls in the past year? 1 1 1  0 0  Number falls in past yr: 0 0 0 0 0  Injury with Fall? 0 1 0 0 0  Follow up Falls evaluation completed        MEDICARE RISK AT HOME:  Medicare Risk at Home Any stairs in or around the home?: No If so, are there any without handrails?: No Home free of loose throw rugs in walkways, pet beds,  electrical cords, etc?: Yes Adequate lighting in your home to reduce risk of falls?: Yes Life alert?: No Use of a cane, walker or w/c?: No Grab bars in the bathroom?: Yes Shower chair or bench in shower?: Yes Elevated toilet seat or a handicapped toilet?: Yes  TIMED UP AND GO:  Was the test performed?  No  Cognitive Function: 6CIT completed    07/16/2023    9:32 AM  MMSE - Mini Mental State Exam  Not completed: Unable to complete        07/16/2023    9:29 AM 06/19/2022    8:43 AM 09/17/2018    8:56 AM  6CIT Screen  What Year? 0 points 0 points 0 points  What month? 0 points 0 points 0 points  What time? 0 points 0 points 0 points  Count back from 20 0 points 0 points 0 points  Months in reverse 0 points 0 points 0 points  Repeat phrase 0 points 0 points 0 points  Total Score 0 points 0 points 0 points    Immunizations Immunization History  Administered Date(s) Administered   Fluad Trivalent(High Dose 65+) 12/16/2022   Influenza,inj,Quad PF,6+ Mos 01/25/2016, 01/02/2017, 01/21/2022   Influenza-Unspecified 03/25/2016   Moderna Sars-Covid-2 Vaccination 08/19/2019, 09/16/2019   PNEUMOCOCCAL CONJUGATE-20 05/10/2021   Pfizer(Comirnaty)Fall Seasonal Vaccine 12 years and older 01/21/2022   Pneumococcal Conjugate-13 07/30/2017   RSV,unspecified 09/06/2022   Tdap 01/25/2016    Screening Tests Health Maintenance  Topic Date Due   COVID-19 Vaccine (4 - 2024-25 season) 11/09/2022   Lung Cancer Screening  08/11/2023   Zoster Vaccines- Shingrix (1 of 2) 12/09/2023 (Originally 11/01/1969)   INFLUENZA VACCINE  10/09/2023   Fecal DNA (Cologuard)  06/03/2024   MAMMOGRAM  07/10/2024   Medicare Annual Wellness (AWV)  07/15/2024   DTaP/Tdap/Td (2 - Td or Tdap) 01/24/2026   Pneumonia Vaccine 39+ Years old  Completed   DEXA SCAN  Completed   Hepatitis C Screening  Completed   HPV VACCINES  Aged Out   Meningococcal B Vaccine  Aged Out    Health Maintenance  Health Maintenance Due   Topic Date Due   COVID-19 Vaccine (4 - 2024-25 season) 11/09/2022   Lung Cancer Screening  08/11/2023   Health Maintenance Items Addressed: Yes  Additional Screening:  Vision Screening: Recommended annual ophthalmology exams for early detection of glaucoma and other disorders of the eye.  Dental Screening: Recommended annual dental exams for proper oral hygiene  Community Resource Referral / Chronic Care Management: CRR required this  visit?  No   CCM required this visit?  No     Plan:     I have personally reviewed and noted the following in the patient's chart:   Medical and social history Use of alcohol, tobacco or illicit drugs  Current medications and supplements including opioid prescriptions. Patient is not currently taking opioid prescriptions. Functional ability and status Nutritional status Physical activity Advanced directives List of other physicians Hospitalizations, surgeries, and ER visits in previous 12 months Vitals Screenings to include cognitive, depression, and falls Referrals and appointments  In addition, I have reviewed and discussed with patient certain preventive protocols, quality metrics, and best practice recommendations. A written personalized care plan for preventive services as well as general preventive health recommendations were provided to patient.     Margette Sheldon, LPN   03/15/1094   After Visit Summary: (MyChart) Due to this being a telephonic visit, the after visit summary with patients personalized plan was offered to patient via MyChart   Nurse Notes: Patient is aware of current care gaps.  Patient is due for Lung Cancer Screening in June 2025. NCIR was verified and immunization record updated. Patient stated no issues with suicidal ideations.

## 2023-07-16 NOTE — Patient Instructions (Addendum)
 [name] , Thank you for taking time to come for your Medicare Wellness Visit. I appreciate your ongoing commitment to your health goals. Please review the following plan we discussed and let me know if I can assist you in the future.   Referrals/Orders/Follow-Ups/Clinician Recommendations: Aim for 30 minutes of exercise or brisk walking 5 days per week.  Drink 6-8 glasses of water each day. Eat 5-6 servings of fresh vegetables and fruits per day. Continue to do brain exercises such as reading, puzzles, games on your phone to help keep the brain sharp and active.  This is a list of the screening recommended for you and due dates:  Health Maintenance  Topic Date Due   COVID-19 Vaccine (4 - 2024-25 season) 11/09/2022   Screening for Lung Cancer  08/11/2023   Zoster (Shingles) Vaccine (1 of 2) 12/09/2023*   Flu Shot  10/09/2023   Cologuard (Stool DNA test)  06/03/2024   Mammogram  07/10/2024   Medicare Annual Wellness Visit  07/15/2024   DTaP/Tdap/Td vaccine (2 - Td or Tdap) 01/24/2026   Pneumonia Vaccine  Completed   DEXA scan (bone density measurement)  Completed   Hepatitis C Screening  Completed   HPV Vaccine  Aged Out   Meningitis B Vaccine  Aged Out  *Topic was postponed. The date shown is not the original due date.    Advanced directives: (Copy Requested) Please bring a copy of your health care power of attorney and living will to the office to be added to your chart at your convenience. You can mail to [facility] [address] [place], [place] [address] or email to [email or web address]  Next Medicare Annual Wellness Visit scheduled for next year: Yes, 07/18/2024 at 9:10 a.m. PHONE VISIT with Nurse Health Advisor  Have you seen your provider in the last 6 months (3 months if uncontrolled diabetes)? Yes

## 2023-07-17 ENCOUNTER — Telehealth: Payer: Self-pay | Admitting: Family Medicine

## 2023-07-17 ENCOUNTER — Ambulatory Visit (INDEPENDENT_AMBULATORY_CARE_PROVIDER_SITE_OTHER): Admitting: Student

## 2023-07-17 ENCOUNTER — Encounter: Admitting: Physical Medicine & Rehabilitation

## 2023-07-17 VITALS — BP 139/68 | HR 80 | Ht 66.0 in | Wt 181.4 lb

## 2023-07-17 DIAGNOSIS — R600 Localized edema: Secondary | ICD-10-CM

## 2023-07-17 DIAGNOSIS — R509 Fever, unspecified: Secondary | ICD-10-CM

## 2023-07-17 MED ORDER — AZITHROMYCIN 250 MG PO TABS: ORAL_TABLET | ORAL | 0 refills | Status: DC

## 2023-07-17 NOTE — Assessment & Plan Note (Addendum)
 Persistent fever, cough, frontal HA with sinus congestion, rhinorrhea, and fatigue for the past three weeks suggest viral vs bacterial infection. Continues to have intermittent fever despite being on Augmentin .  Will consider them azithromycin for atypical bacterial coverage with plans for close follow-up.  Recent chest x-ray was unrevealing for any infectious process at this time and UA was negative. - Rx azithromycin - Patient to finish Augmentin  course - Can consider repeat checks x-ray if symptoms unimproved. - Follow-up in a week

## 2023-07-17 NOTE — Telephone Encounter (Signed)
 Called patient regarding lab results and chest x-ray results.  Chest x-ray concerning for pulmonary edema which is suggestive of new congestive heart failure.  Bilateral small pleural effusions present.  Discussed with patient.  Patient has appointment today at 8:50 AM.  With Dr. Mikeal Alder.  Discussed that he will discuss further treatment plans at that time.  I will message Dr. Mikeal Alder about this.  Other labs reviewed with the patient.  All questions answered.

## 2023-07-17 NOTE — Progress Notes (Signed)
    SUBJECTIVE:   CHIEF COMPLAINT / HPI:   [name] "[name]" is a 73 year old female who presents with persistent fever and cough.  She has experienced fever and cough for three weeks, with the [name] fever recorded at 102F two days ago, accompanied by chills. She has a persistent headache, runny nose, and productive cough with phlegm. Shortness of breath occurs, and she is unable to lie flat at night due to discomfort. Swelling is present in her legs up to the calf. She has night sweats and chills but no skin rashes or recent travel. Fatigue and lack of energy have been constant since symptom onset. Initially, she had vomiting for two days followed by diarrhea for two days, along with fever.   Burning during urination started [name] week, and a urine specimen was collected. She has no abdominal pain, ear pain, or chest pain. She lives alone and has not been in contact with sick individuals, including her grandchildren, whom she [name] saw on Easter Sunday.  CXR showed pulmonary vascular congestion and small Pleural effusion. Echo in 2024 showed EF of 60-65% with mild concentric LVH, mild mitral valve regurg, mild to moderate aortic valve regurg and Aortic valve sclerosis/calcification.  PERTINENT  PMH / PSH: Reviewed   OBJECTIVE:   BP 139/68   Pulse 80   Ht 5\' 6"  (1.676 m)   Wt 181 lb 6.4 oz (82.3 kg)   SpO2 98%   BMI 29.28 kg/m    Physical Exam General: Alert, well appearing, NAD HEENT: Oropharyngeal erythema or tonsillar exudate.  No cervical lymphadenopathy Cardiovascular: RRR, No Murmurs, Normal S2/S2 Respiratory: CTAB, No wheezing or Rales Abdomen: No distension or tenderness Extremities: +1 BLE edema  to the distal shin   ASSESSMENT/PLAN:   Upper respiratory tract infection Persistent fever, cough, cephalgia, rhinorrhea, and fatigue for the past three weeks suggest viral vs bacterial infection. Continues to have intermittent fever despite being on Augmentin .  Will  consider them azithromycin for atypical bacterial coverage with plans for close follow-up.  Recent chest x-ray was unrevealing for any infectious process at this time and UA was negative. - Rx azithromycin - Patient to finish Augmentin  course - Can consider repeat checks x-ray if symptoms unimproved. - Follow-up in a week   BLE Edema  Lower extremity edema, dyspnea, and orthopnea with vascular congestion and small pleural effusion on chest X-ray suggest possible heart failure. Symptoms indicate cardiac insufficiency vs fluid retention. - Patient advised to contact her cardiologist to be evaluated as soon as possible. - Ordered echocardiogram to assess cardiac function.  Courtney Last, MD [facility]

## 2023-07-17 NOTE — Patient Instructions (Signed)
 Pleasure to see you today.  Today I have added in new antibiotic azithromycin which you will take 2 tablets today and then 1 tablets for the next 4 days in addition to your Augmentin .  Also because of the swellings in your leg and your checks x-ray which showed blood could be pulling back to your lungs I have ordered a direct echocardiogram to check function of your heart.  Please make sure to contact your cardiologist to be seen as soon as possible.  I Recommend follow-up on Monday or Tuesday to reassess how you are feeling.

## 2023-07-20 ENCOUNTER — Ambulatory Visit
Admission: RE | Admit: 2023-07-20 | Discharge: 2023-07-20 | Disposition: A | Discharge status: Home / Self Care | Payer: Medicare Other | Source: Ambulatory Visit | Attending: Family Medicine | Admitting: Family Medicine

## 2023-07-20 ENCOUNTER — Telehealth: Payer: Self-pay | Admitting: Family

## 2023-07-20 ENCOUNTER — Ambulatory Visit (INDEPENDENT_AMBULATORY_CARE_PROVIDER_SITE_OTHER): Admitting: Family Medicine

## 2023-07-20 VITALS — BP 160/84 | HR 83 | Ht 66.0 in | Wt 176.5 lb

## 2023-07-20 DIAGNOSIS — M8588 Other specified disorders of bone density and structure, other site: Secondary | ICD-10-CM | POA: Diagnosis not present

## 2023-07-20 DIAGNOSIS — M8589 Other specified disorders of bone density and structure, multiple sites: Secondary | ICD-10-CM

## 2023-07-20 DIAGNOSIS — Z5181 Encounter for therapeutic drug level monitoring: Secondary | ICD-10-CM

## 2023-07-20 DIAGNOSIS — M8580 Other specified disorders of bone density and structure, unspecified site: Secondary | ICD-10-CM

## 2023-07-20 DIAGNOSIS — R0602 Shortness of breath: Secondary | ICD-10-CM

## 2023-07-20 DIAGNOSIS — I5032 Chronic diastolic (congestive) heart failure: Secondary | ICD-10-CM

## 2023-07-20 DIAGNOSIS — I10 Essential (primary) hypertension: Secondary | ICD-10-CM

## 2023-07-20 DIAGNOSIS — J189 Pneumonia, unspecified organism: Secondary | ICD-10-CM

## 2023-07-20 DIAGNOSIS — N958 Other specified menopausal and perimenopausal disorders: Secondary | ICD-10-CM | POA: Diagnosis not present

## 2023-07-20 DIAGNOSIS — Z0000 Encounter for general adult medical examination without abnormal findings: Secondary | ICD-10-CM

## 2023-07-20 DIAGNOSIS — Z13820 Encounter for screening for osteoporosis: Secondary | ICD-10-CM

## 2023-07-20 NOTE — Patient Instructions (Signed)
 It was great to see you! Thank you for allowing me to participate in your care!  Our plans for today:  - I am glad you are doing better! - Please call your cardiologist to schedule an appointment. - You should receive a call to schedule your US  of your heart. - If you notice your weight is going up fast, or you have increased difficulty breathing, or lots of swelling please take an extra dose of your Lasix . - Please finish taking your antibiotics.    Please arrive 15 minutes PRIOR to your next scheduled appointment time! If you do not, this affects OTHER patients' care.  Take care and seek immediate care sooner if you develop any concerns.   Ivin Marrow, MD, PGY-2 [facility] 9:39 AM 07/20/2023  [facility]

## 2023-07-20 NOTE — Telephone Encounter (Signed)
 Agree with PCP recommendation to double Lasix  for 1 week. Glad it has improved dyspnea, swelling.   Continue Lasix  40mg  daily. May take additional 40mg  as needed for weight gain of 2 lbs overnight or 5 lbs in one week. If agreeable, recommend BMET/BNP this week to reassess kidney function and volume status.   Agree with recommendation for echo if we could get it scheduled and see her ~1 week after echo to review results.  Tips/tricks for swelling below.   To prevent or reduce lower extremity swelling: Eat a low salt diet. Salt makes the body hold onto extra fluid which causes swelling. Sit with legs elevated. For example, in the recliner or on an ottoman.  Wear knee-high compression stockings during the daytime. Ones labeled 15-20 mmHg provide good compression.   Alaynah Schutter S Everitt Wenner, NP

## 2023-07-20 NOTE — Telephone Encounter (Signed)
 Spoke with patient, saw her PCP last week  Has been seeing them for sinus infection/cough.  Xray results below,   IMPRESSION: Acute CHF with small pleural effusions.  From chest Xray 07/13/2023  Last week was advised to double up on her Lasix  for the week (80 mg a day), weight down 10 pounds Is still having some swelling and shortness of breath improved.  Was told needs to get Echo   Will forward to Caitlin for review

## 2023-07-20 NOTE — Progress Notes (Cosign Needed)
    SUBJECTIVE:   CHIEF COMPLAINT / HPI: f/u  Seen multiple times in clinic for ongoing cough and fever. Seen in clinic 07/17/23, possible new acute heart failure exacerbation. Has 2 more days of azithromycin. Feeling much better. Last fever was Wednesday.  Cough also better. Still has orthopnea.  Cold symptoms are improving. Takes 40 of lasix  daily.  Has not been able to call cardiologist yet.    PERTINENT  PMH / PSH: HTN  OBJECTIVE:   BP (!) 146/76   Pulse 83   Ht 5\' 6"  (1.676 m)   Wt 176 lb 8 oz (80.1 kg)   SpO2 97%   BMI 28.49 kg/m   General: NAD, well appearing Neuro: A&O Cardiovascular: RRR, no murmurs,  Respiratory: normal WOB on RA, CTAB, mild crackles left lung base Extremities: Moving all 4 extremities equally, trace pitting peripheral edeam   ASSESSMENT/PLAN:   Assessment & Plan Atypical pneumonia Greatly improved symptomatically with initiation of azithromycin, suspect patient's presentation was secondary to atypical pneumonia.  However it is interesting that she did not improve with doxycycline.  Directed to finish completing her azithromycin course.  Reassuringly today normal work of breathing on room air, saturating well. Chronic diastolic congestive heart failure (HCC) Still has mild symptoms of volume overload although improved from previous week.  Recommended if she notices her weight going up to take an extra dose of her daily Lasix .  Instructed to call her cardiologist today to schedule an appointment.  She is awaiting insurance authorization for a repeat echocardiogram.   Return in about 2 months (around 09/19/2023) for chronic condition f/u.  Some or all of this note was dictated use Dragon software, there may be unintentional errors present.  Ivin Marrow, MD [facility], [facility], [facility]

## 2023-07-20 NOTE — Telephone Encounter (Signed)
 Advised patient, verbalized understanding

## 2023-07-20 NOTE — Telephone Encounter (Signed)
 Pt c/o swelling/edema: STAT if pt has developed SOB within 24 hours  If swelling, where is the swelling located? Legs   How much weight have you gained and in what time span? Lost 10 lbs appt 5/5 she was 186lbs, today she is 176lbs   Have you gained 2 pounds in a day or 5 pounds in a week? None   Do you have a log of your daily weights (if so, list)?  186lbs - last week 5/5  176lbs - today  Are you currently taking a fluid pill? Yes   Are you currently SOB? No   Have you traveled recently in a car or plane for an extended period of time? No

## 2023-07-21 ENCOUNTER — Ambulatory Visit: Payer: Self-pay | Admitting: Student

## 2023-07-22 ENCOUNTER — Other Ambulatory Visit (HOSPITAL_COMMUNITY): Payer: Self-pay | Admitting: Student

## 2023-07-22 DIAGNOSIS — R0602 Shortness of breath: Secondary | ICD-10-CM | POA: Diagnosis not present

## 2023-07-22 DIAGNOSIS — Z5181 Encounter for therapeutic drug level monitoring: Secondary | ICD-10-CM | POA: Diagnosis not present

## 2023-07-23 ENCOUNTER — Ambulatory Visit (HOSPITAL_BASED_OUTPATIENT_CLINIC_OR_DEPARTMENT_OTHER): Payer: Self-pay | Admitting: Family

## 2023-07-23 LAB — BASIC METABOLIC PANEL WITH GFR
BUN/Creatinine Ratio: 17 (ref 12–28)
BUN: 17 mg/dL (ref 8–27)
CO2: 22 mmol/L (ref 20–29)
Calcium: 8.7 mg/dL (ref 8.7–10.3)
Chloride: 103 mmol/L (ref 96–106)
Creatinine, Ser: 1 mg/dL (ref 0.57–1.00)
Glucose: 102 mg/dL — ABNORMAL HIGH (ref 70–99)
Potassium: 4.2 mmol/L (ref 3.5–5.2)
Sodium: 142 mmol/L (ref 134–144)
eGFR: 60 mL/min/{1.73_m2} (ref >59)

## 2023-07-23 LAB — BRAIN NATRIURETIC PEPTIDE: BNP: 161.3 pg/mL — ABNORMAL HIGH (ref 0.0–100.0)

## 2023-07-27 ENCOUNTER — Other Ambulatory Visit (HOSPITAL_COMMUNITY): Payer: Self-pay

## 2023-07-27 ENCOUNTER — Telehealth: Payer: Self-pay | Admitting: Pharmacist

## 2023-07-27 NOTE — Telephone Encounter (Signed)
 Pharmacy Patient Advocate Encounter   Received notification from CoverMyMeds that prior authorization for Nurtec 75MG  dispersible tablets is required/requested.   Insurance verification completed.   The patient is insured through [facility] .   Per test claim: No PA needed at this time. This test claim was processed through [facility]- copay amounts may vary at other pharmacies due to pharmacy/plan contracts, or as the patient moves through the different stages of their insurance plan.

## 2023-08-01 ENCOUNTER — Other Ambulatory Visit: Payer: Self-pay | Admitting: Student

## 2023-08-02 ENCOUNTER — Other Ambulatory Visit: Payer: Self-pay | Admitting: Physical Medicine & Rehabilitation

## 2023-08-03 ENCOUNTER — Encounter: Payer: Self-pay | Admitting: Physical Medicine & Rehabilitation

## 2023-08-04 ENCOUNTER — Telehealth: Payer: Self-pay

## 2023-08-04 ENCOUNTER — Telehealth: Payer: Self-pay | Admitting: Registered Nurse

## 2023-08-04 NOTE — Telephone Encounter (Signed)
 Call placed to [name], no answer. Left message to return the call.

## 2023-08-04 NOTE — Telephone Encounter (Signed)
 Pt. Need a refill on tramadol . Pt. See dr Sharl Davies. Please advise.

## 2023-08-06 DIAGNOSIS — N1831 Chronic kidney disease, stage 3a: Secondary | ICD-10-CM | POA: Diagnosis not present

## 2023-08-06 DIAGNOSIS — I129 Hypertensive chronic kidney disease with stage 1 through stage 4 chronic kidney disease, or unspecified chronic kidney disease: Secondary | ICD-10-CM | POA: Diagnosis not present

## 2023-08-06 DIAGNOSIS — N2581 Secondary hyperparathyroidism of renal origin: Secondary | ICD-10-CM | POA: Diagnosis not present

## 2023-08-06 DIAGNOSIS — D631 Anemia in chronic kidney disease: Secondary | ICD-10-CM | POA: Diagnosis not present

## 2023-08-06 DIAGNOSIS — R3129 Other microscopic hematuria: Secondary | ICD-10-CM | POA: Diagnosis not present

## 2023-08-07 ENCOUNTER — Other Ambulatory Visit: Payer: Self-pay | Admitting: Student

## 2023-08-07 ENCOUNTER — Telehealth: Payer: Self-pay | Admitting: Registered Nurse

## 2023-08-07 NOTE — Telephone Encounter (Signed)
 [name] return the call, she is adamant that she received Tramadol  from Pharmacy. I will call pharmacy and speak with the pharmacist, she verbalizes understanding.

## 2023-08-07 NOTE — Telephone Encounter (Signed)
 Receibved a call from [name], she reports she received a doubl fill on her Tramadol .  PMP was reviewed, last fill date was 12/24/2022.  Last UDS was on 05/08/2020.  Call placed to [name] regarding the above, no answer. Left message for her to return the call.  [name] needs to have a UDS, she can go to [facility] in [place].order will need to be placed.

## 2023-08-10 ENCOUNTER — Ambulatory Visit (INDEPENDENT_AMBULATORY_CARE_PROVIDER_SITE_OTHER): Admitting: Student

## 2023-08-10 ENCOUNTER — Encounter: Payer: Self-pay | Admitting: Student

## 2023-08-10 VITALS — BP 95/54 | HR 56 | Ht 66.0 in | Wt 179.8 lb

## 2023-08-10 DIAGNOSIS — R5383 Other fatigue: Secondary | ICD-10-CM

## 2023-08-10 DIAGNOSIS — I5032 Chronic diastolic (congestive) heart failure: Secondary | ICD-10-CM | POA: Diagnosis not present

## 2023-08-10 DIAGNOSIS — Z87891 Personal history of nicotine dependence: Secondary | ICD-10-CM

## 2023-08-10 LAB — LAB REPORT - SCANNED: Creatinine, POC: 27.1 mg/dL

## 2023-08-10 NOTE — Patient Instructions (Signed)
 It was great to see you! Thank you for allowing me to participate in your care!  I recommend that you always bring your medications to each appointment as this makes it easy to ensure you are on the correct medications and helps us  not miss when refills are needed.  Our plans for today:  - continue following with cardiology. I recommend taking a dose of lasix  when you get home and continue as needed for swelling, weight gain. If you feels short of breath in addition to swelling/weight gain, return or go to the ED - Return in ~ 1-2 months to check in and meet new PCP - Go to apt for annual lung cancer screening CT scan -Recommendations for osteopenia are to avoid smoking, avoid alcohol, get regular physical activity including bone strengthening exercises such as weightlifting and to optimize your intake of vitamin D  and calcium . The recommended calcium  intake is 1200 mg daily, if you are not getting this through diet alone you can take an over-the-counter calcium  supplement (~500 mg /day) to help each this goal in addition to taking vitamin D3 800 international units daily    Take care and seek immediate care sooner if you develop any concerns.   Dr. Glenn Lange, DO [facility]

## 2023-08-10 NOTE — Progress Notes (Cosign Needed Addendum)
    SUBJECTIVE:   CHIEF COMPLAINT / HPI:   [name] "[name]" is a 73 year old female with chronic kidney disease and concern for recent heart failure exacerbation who presents with fatigue and swelling.  She experiences significant fatigue for the past month, feeling exhausted and unable to regain energy. This coincides with her recent heart failure diagnosis and a sinus infection treated with antibiotics. Her blood pressure fluctuates, sometimes low, correlating with sluggishness.  She is not currently feeling SOB, chest pain, dizziness. She just feels exhausted.  Swelling occurs in her feet and legs, particularly when she skips Lasix .  Per pt there have been conflicting recommendations - cardiology advised lasix  40 mg daily and per pt nephrology advise to hold lasix  although I cannot see their notes to confirm.   PERTINENT  PMH / PSH: CKD, HTN, hx TIA, HLD,   OBJECTIVE:   BP (!) 95/54   Pulse (!) 56   Ht 5\' 6"  (1.676 m)   Wt 179 lb 12.8 oz (81.6 kg)   SpO2 99%   BMI 29.02 kg/m    General: NAD, pleasant, well-appearing Cardiac: slightly bradycardic, normal rhythm Respiratory: CTAB, normal effort, No crackles Extremities: mild edema up to ankles Skin: warm and dry Neuro: alert, no obvious focal deficits Psych: Normal affect and mood  ASSESSMENT/PLAN:   Chronic diastolic congestive heart failure (HCC) Patient has diastolic congestive heart failure with concern for acute exacerbation.  Is already established with cardiology. Has echo scheduled for 08/24/2023 and cardiology appointment for 08/28/2023. Her vitals are significant for lower blood pressure and slightly low heart rate at 56 with regular rhythm.  Reassuringly, no shortness of breath, chest pain, lightheadedness and the decreased energy has been going on for at least a month which I suspect is related to recent illnesses and heart failure exacerbation. Has some confusion about when she should take her Lasix  per  cardiology versus nephrology.   She does have some mild swelling to her ankles but lungs are clear and she is breathing comfortably with good oxygen saturation.  I advised her to go ahead and take a dose when she gets home and continue to take it as needed for swelling, weight gain and shortness of breath.   - Lasix  40 mg as needed for shortness of breath, weight gain, and swelling - Hold losartan , check BP at home and resume once >115/60 - Check HR at home, if persistently low or other symptoms develop, she is advised to return before her cardiology appointment at which point I recommend an EKG to rule out arrhythmia - She should return or go to ED if shortness of breath, chest pain, or lightheadedness develops     Fatigue Fatigue likely associated with recent illnesses and suspected heart failure exacerbation and lower BP as above.  Also discussed the possibility that polypharmacy is contributing as she is on multiple potentially sedative medications including gabapentin , trazodone , doxepin  tramadol , tizanidine .  She sees multiple doctors who prescribe the different medications.  I did advise scheduling a visit to discuss necessity for these medications and potentially decreasing dosages/come off some of them if able.    Health maintenance Annual low-dose chest CT ordered for lung cancer screening  Dr. Glenn Lange, DO [facility]

## 2023-08-11 ENCOUNTER — Other Ambulatory Visit: Payer: Self-pay | Admitting: *Deleted

## 2023-08-11 DIAGNOSIS — I5032 Chronic diastolic (congestive) heart failure: Secondary | ICD-10-CM | POA: Insufficient documentation

## 2023-08-11 NOTE — Assessment & Plan Note (Addendum)
 Patient has diastolic congestive heart failure with concern for acute exacerbation.  Is already established with cardiology. Has echo scheduled for 08/24/2023 and cardiology appointment for 08/28/2023. Her vitals are significant for lower blood pressure and slightly low heart rate at 56 with regular rhythm.  Reassuringly, no shortness of breath, chest pain, lightheadedness and the decreased energy has been going on for at least a month which I suspect is related to recent illnesses and heart failure exacerbation. Has some confusion about when she should take her Lasix  per cardiology versus nephrology.   She does have some mild swelling to her ankles but lungs are clear and she is breathing comfortably with good oxygen saturation.  I advised her to go ahead and take a dose when she gets home and continue to take it as needed for swelling, weight gain and shortness of breath.   - Lasix  40 mg as needed for shortness of breath, weight gain, and swelling - Hold losartan , check BP at home and resume once >115/60 - Check HR at home, if persistently low or other symptoms develop, she is advised to return before her cardiology appointment at which point I recommend an EKG to rule out arrhythmia - She should return or go to ED if shortness of breath, chest pain, or lightheadedness develops

## 2023-08-11 NOTE — Telephone Encounter (Signed)
 [id number] on tizanidine  4mg  tablet. Refused since refill already sent to them on 07/09/23 #180, 1 refill.

## 2023-08-11 NOTE — Assessment & Plan Note (Signed)
 Fatigue likely associated with recent illnesses and suspected heart failure exacerbation and lower BP as above.  Also discussed the possibility that polypharmacy is contributing as she is on multiple potentially sedative medications including gabapentin , trazodone , doxepin  tramadol , tizanidine .  She sees multiple doctors who prescribe the different medications.  I did advise scheduling a visit to discuss necessity for these medications and potentially decreasing dosages/come off some of them if able.

## 2023-08-14 ENCOUNTER — Telehealth: Payer: Self-pay | Admitting: Student

## 2023-08-14 ENCOUNTER — Ambulatory Visit (HOSPITAL_COMMUNITY)
Admission: RE | Admit: 2023-08-14 | Discharge: 2023-08-14 | Disposition: A | Discharge status: Home / Self Care | Source: Ambulatory Visit | Attending: Family Medicine | Admitting: Family Medicine

## 2023-08-14 ENCOUNTER — Encounter: Payer: Self-pay | Admitting: Physical Medicine & Rehabilitation

## 2023-08-14 ENCOUNTER — Encounter: Attending: Physical Medicine & Rehabilitation | Admitting: Physical Medicine & Rehabilitation

## 2023-08-14 ENCOUNTER — Encounter: Payer: Self-pay | Admitting: Student

## 2023-08-14 VITALS — BP 131/74 | HR 66 | Ht 66.0 in | Wt 174.0 lb

## 2023-08-14 DIAGNOSIS — G894 Chronic pain syndrome: Secondary | ICD-10-CM | POA: Insufficient documentation

## 2023-08-14 DIAGNOSIS — Z87891 Personal history of nicotine dependence: Secondary | ICD-10-CM | POA: Insufficient documentation

## 2023-08-14 DIAGNOSIS — Z5181 Encounter for therapeutic drug level monitoring: Secondary | ICD-10-CM | POA: Insufficient documentation

## 2023-08-14 DIAGNOSIS — Z79891 Long term (current) use of opiate analgesic: Secondary | ICD-10-CM | POA: Insufficient documentation

## 2023-08-14 DIAGNOSIS — M47817 Spondylosis without myelopathy or radiculopathy, lumbosacral region: Secondary | ICD-10-CM | POA: Diagnosis not present

## 2023-08-14 DIAGNOSIS — F17210 Nicotine dependence, cigarettes, uncomplicated: Secondary | ICD-10-CM | POA: Diagnosis not present

## 2023-08-14 DIAGNOSIS — M47816 Spondylosis without myelopathy or radiculopathy, lumbar region: Secondary | ICD-10-CM | POA: Diagnosis not present

## 2023-08-14 MED ORDER — TRAMADOL HCL 50 MG PO TABS: ORAL_TABLET | ORAL | 5 refills | Status: AC

## 2023-08-14 MED ORDER — LIDOCAINE HCL (PF) 2 % IJ SOLN
3.0000 mL | Freq: Once | INTRAMUSCULAR | Status: AC
  Administered 2023-08-14: 3 mL

## 2023-08-14 MED ORDER — LIDOCAINE HCL 1 % IJ SOLN
10.0000 mL | Freq: Once | INTRAMUSCULAR | Status: AC
  Administered 2023-08-14: 10 mL

## 2023-08-14 NOTE — Progress Notes (Signed)
  PROCEDURE RECORD [facility]   Name: [name] DOB:[birth year] MRN: [id number]  Date:08/14/2023  Physician: Janeece Mechanic, MD    Nurse/CMA: Merlyn Starring, CMA  Allergies:  Allergies  Allergen Reactions   Iodinated Contrast Media Shortness Of Breath and Itching    States she had this reaction connected with a MRI of the cervical spine.   Shellfish Allergy Anaphylaxis    All seafood   Crestor  [Rosuvastatin ] Other (See Comments)    Anxiety, itching, headaches, myalgias Able to tolerate 5mg  daily   Lipitor [Atorvastatin ] Other (See Comments)    Anxiety, itching, headaches, myalgias   Statins Other (See Comments)    Arthralgias (severe) with atorvastatin , mild arthralgias with rosuvastatin  but willing to continue taking it   Iodine     Consent Signed: Yes.    Is patient diabetic? No.  CBG today? .  Pregnant: No. LMP: No LMP recorded. Patient has had a hysterectomy. (age [age])  Anticoagulants: no Anti-inflammatory: no Antibiotics: no  Procedure: Right L3-4-5 Radiofrequency  Position: Prone Start Time: 10:30 am  End Time: 10:42 am  Fluoro Time: 40  RN/CMA Merlyn Starring, CMA [name], CMA    Time 10:15 am 10:50 am    BP 131/74 159/78    Pulse 64 69    Respirations 16 16    O2 Sat 96 96    S/S 6 6    Pain Level 10/10 0/10     D/C home with no one, [name] & O X 3, D/C instructions reviewed, and sits independently.

## 2023-08-14 NOTE — Patient Instructions (Signed)
You had a radio frequency procedure today This was done to alleviate joint pain in your lumbar area We injected lidocaine which is a local anesthetic.  You may experience soreness at the injection sites. You may also experienced some irritation of the nerves that were heated I'm recommending ice for 30 minutes every 2 hours as needed for the next 24-48 hours   

## 2023-08-14 NOTE — Telephone Encounter (Addendum)
 Attempted to call patient after reading her recent MyChart message saying her blood pressure is still low, she is feeling very unwell and losing weight.  She did not pick up.  Left a voicemail saying I would send her a MyChart message back.  I would advise her to be seen in access to care this afternoon if there is availability.  Otherwise, she could go to urgent care or ED over the weekend.  If she does not want to do this, I recommend her getting scheduled ASAP next week for further evaluation.   She is scheduled for echocardiogram and visit with cardiologist this month due to concerns for heart failure exacerbation. In MyChart message, patient expressed significant concern regarding malignancy given her family history and personal history of skin cancer. Did have recently elevated CRP to 28 in the setting of being ill.  Recent CBC was normal but not with differential. Recommend LDH, CBC with differential, repeating CRP for hopefully peace of mind and considering EKG if arrhythmia suspected. She was seen at [facility] today and per their documentation, blood pressure was 131/74, heart rate 66, and SpO2 96.  Vitals are reassuring. I suspect weight loss is likely related to lasix  use.

## 2023-08-14 NOTE — Progress Notes (Signed)
RightL5 dorsal ramus., Right L4 and Right L3 medial branch radio frequency neurotomy under fluoroscopic guidance   Indication: Low back pain due to lumbar spondylosis which has been relieved on 2 occasions by greater than 50% by lumbar medial branch blocks at corresponding levels.  Informed consent was obtained after describing risks and benefits of the procedure with the patient, this includes bleeding, bruising, infection, paralysis and medication side effects. The patient wishes to proceed and has given written consent. The patient was placed in a prone position. The lumbar and sacral area was marked and prepped with Betadine. A 25-gauge 1-1/2 inch needle was inserted into the skin and subcutaneous tissue at 3 sites in one ML of 1% lidocaine was injected into each site. Then a 18-gauge 10 cm radio frequency needle with a 1 cm curved active tip was inserted targeting the Right S1 SAP/sacral ala junction. Bone contact was made and confirmed with lateral imaging.  motor stimulation at 2 Hz confirm proper needle location followed by injection of 1ml 2% MPF lidocaine. Then the Right L5 SAP/transverse process junction was targeted. Bone contact was made and confirmed with lateral imaging.  motor stimulation at 2 Hz confirm proper needle location followed by injection of 1ml 2% MPF lidocaine. Then the Right L4 SAP/transverse process junction was targeted. Bone contact was made and confirmed with lateral imaging. motor stimulation at 2 Hz confirm proper needle location followed by injection of 1ml 2% MPF lidocaine. Radio frequency lesion being at 80C for 90 seconds was performed. Needles were removed. Post procedure instructions and vital signs were performed. Patient tolerated procedure well. Followup appointment was given. 

## 2023-08-16 ENCOUNTER — Ambulatory Visit (HOSPITAL_COMMUNITY)

## 2023-08-18 ENCOUNTER — Ambulatory Visit: Payer: Medicare Other

## 2023-08-18 ENCOUNTER — Encounter: Payer: Self-pay | Admitting: *Deleted

## 2023-08-18 VITALS — BP 95/60 | HR 67 | Temp 98.1°F | Resp 20 | Ht 66.0 in | Wt 172.8 lb

## 2023-08-18 DIAGNOSIS — G459 Transient cerebral ischemic attack, unspecified: Secondary | ICD-10-CM | POA: Diagnosis not present

## 2023-08-18 DIAGNOSIS — I739 Peripheral vascular disease, unspecified: Secondary | ICD-10-CM | POA: Diagnosis not present

## 2023-08-18 DIAGNOSIS — E785 Hyperlipidemia, unspecified: Secondary | ICD-10-CM

## 2023-08-18 MED ORDER — INCLISIRAN SODIUM 284 MG/1.5ML ~~LOC~~ SOSY
284.0000 mg | PREFILLED_SYRINGE | Freq: Once | SUBCUTANEOUS | Status: AC
  Administered 2023-08-18: 284 mg via SUBCUTANEOUS
  Filled 2023-08-18: qty 1.5

## 2023-08-18 NOTE — Progress Notes (Signed)
 Diagnosis: Hyperlipidemia  Provider:  Chilton Greathouse MD  Procedure: Injection  Leqvio (inclisiran), Dose: 284 mg, Site: subcutaneous, Number of injections: 1  Injection Site(s): Right arm  Post Care:  right arm injection  Discharge: Condition: Good, Destination: Home . AVS Declined  Performed by:  Rico Ala, LPN

## 2023-08-19 ENCOUNTER — Other Ambulatory Visit (HOSPITAL_BASED_OUTPATIENT_CLINIC_OR_DEPARTMENT_OTHER)

## 2023-08-19 LAB — TOXASSURE SELECT,+ANTIDEPR,UR

## 2023-08-20 ENCOUNTER — Telehealth: Payer: Self-pay | Admitting: Family Medicine

## 2023-08-20 ENCOUNTER — Encounter (HOSPITAL_BASED_OUTPATIENT_CLINIC_OR_DEPARTMENT_OTHER): Payer: Self-pay

## 2023-08-20 NOTE — Telephone Encounter (Signed)
 Can you call radiology and see if they can read this CT scan sometime soon? It was done a week ago. THANKS! Violetta Grice

## 2023-08-20 NOTE — Telephone Encounter (Signed)
 Called radiology and spoke with Gabe. Informed Gabe that the patient had a CT scan done last week and per Dr. Karie Ou request was inquiring if the scan can be read. Iran Manna stated that he will send it to the radiology team to read the scan and will send the information to Dr. Andree Kayser.  Christ Courier, CMA

## 2023-08-21 ENCOUNTER — Ambulatory Visit: Payer: Self-pay | Admitting: Student

## 2023-08-21 ENCOUNTER — Ambulatory Visit: Admitting: Student

## 2023-08-24 ENCOUNTER — Other Ambulatory Visit: Payer: Self-pay

## 2023-08-24 ENCOUNTER — Emergency Department (HOSPITAL_COMMUNITY)

## 2023-08-24 ENCOUNTER — Encounter (HOSPITAL_COMMUNITY): Payer: Self-pay

## 2023-08-24 ENCOUNTER — Ambulatory Visit (INDEPENDENT_AMBULATORY_CARE_PROVIDER_SITE_OTHER)

## 2023-08-24 ENCOUNTER — Encounter: Payer: Self-pay | Admitting: Student

## 2023-08-24 ENCOUNTER — Inpatient Hospital Stay (HOSPITAL_COMMUNITY)
Admission: EM | Admit: 2023-08-24 | Discharge: 2023-08-26 | DRG: 322 | Disposition: A | Discharge status: Home / Self Care | Attending: Family Medicine | Admitting: Family Medicine

## 2023-08-24 ENCOUNTER — Ambulatory Visit (INDEPENDENT_AMBULATORY_CARE_PROVIDER_SITE_OTHER): Admitting: Student

## 2023-08-24 VITALS — BP 118/80 | HR 78 | Ht 66.0 in | Wt 174.0 lb

## 2023-08-24 DIAGNOSIS — Z87891 Personal history of nicotine dependence: Secondary | ICD-10-CM | POA: Diagnosis not present

## 2023-08-24 DIAGNOSIS — Z5986 Financial insecurity: Secondary | ICD-10-CM

## 2023-08-24 DIAGNOSIS — Z91041 Radiographic dye allergy status: Secondary | ICD-10-CM

## 2023-08-24 DIAGNOSIS — I200 Unstable angina: Principal | ICD-10-CM | POA: Diagnosis present

## 2023-08-24 DIAGNOSIS — Z8673 Personal history of transient ischemic attack (TIA), and cerebral infarction without residual deficits: Secondary | ICD-10-CM | POA: Diagnosis not present

## 2023-08-24 DIAGNOSIS — N1831 Chronic kidney disease, stage 3a: Secondary | ICD-10-CM | POA: Diagnosis not present

## 2023-08-24 DIAGNOSIS — I130 Hypertensive heart and chronic kidney disease with heart failure and stage 1 through stage 4 chronic kidney disease, or unspecified chronic kidney disease: Secondary | ICD-10-CM | POA: Diagnosis present

## 2023-08-24 DIAGNOSIS — Z801 Family history of malignant neoplasm of trachea, bronchus and lung: Secondary | ICD-10-CM | POA: Diagnosis not present

## 2023-08-24 DIAGNOSIS — R600 Localized edema: Secondary | ICD-10-CM | POA: Diagnosis not present

## 2023-08-24 DIAGNOSIS — Z7902 Long term (current) use of antithrombotics/antiplatelets: Secondary | ICD-10-CM | POA: Diagnosis not present

## 2023-08-24 DIAGNOSIS — I10 Essential (primary) hypertension: Secondary | ICD-10-CM

## 2023-08-24 DIAGNOSIS — R079 Chest pain, unspecified: Secondary | ICD-10-CM | POA: Diagnosis present

## 2023-08-24 DIAGNOSIS — Z8042 Family history of malignant neoplasm of prostate: Secondary | ICD-10-CM

## 2023-08-24 DIAGNOSIS — I5032 Chronic diastolic (congestive) heart failure: Secondary | ICD-10-CM

## 2023-08-24 DIAGNOSIS — I700 Atherosclerosis of aorta: Secondary | ICD-10-CM | POA: Diagnosis not present

## 2023-08-24 DIAGNOSIS — Z981 Arthrodesis status: Secondary | ICD-10-CM | POA: Diagnosis not present

## 2023-08-24 DIAGNOSIS — Z83438 Family history of other disorder of lipoprotein metabolism and other lipidemia: Secondary | ICD-10-CM

## 2023-08-24 DIAGNOSIS — Z789 Other specified health status: Secondary | ICD-10-CM

## 2023-08-24 DIAGNOSIS — E785 Hyperlipidemia, unspecified: Secondary | ICD-10-CM | POA: Diagnosis present

## 2023-08-24 DIAGNOSIS — R9431 Abnormal electrocardiogram [ECG] [EKG]: Secondary | ICD-10-CM | POA: Diagnosis not present

## 2023-08-24 DIAGNOSIS — Z79899 Other long term (current) drug therapy: Secondary | ICD-10-CM

## 2023-08-24 DIAGNOSIS — E876 Hypokalemia: Secondary | ICD-10-CM | POA: Diagnosis present

## 2023-08-24 DIAGNOSIS — G43909 Migraine, unspecified, not intractable, without status migrainosus: Secondary | ICD-10-CM | POA: Diagnosis not present

## 2023-08-24 DIAGNOSIS — Z91013 Allergy to seafood: Secondary | ICD-10-CM | POA: Diagnosis not present

## 2023-08-24 DIAGNOSIS — I2510 Atherosclerotic heart disease of native coronary artery without angina pectoris: Secondary | ICD-10-CM | POA: Diagnosis present

## 2023-08-24 DIAGNOSIS — Z955 Presence of coronary angioplasty implant and graft: Secondary | ICD-10-CM

## 2023-08-24 DIAGNOSIS — Z86711 Personal history of pulmonary embolism: Secondary | ICD-10-CM | POA: Diagnosis not present

## 2023-08-24 DIAGNOSIS — Z7982 Long term (current) use of aspirin: Secondary | ICD-10-CM

## 2023-08-24 DIAGNOSIS — Z66 Do not resuscitate: Secondary | ICD-10-CM | POA: Diagnosis present

## 2023-08-24 DIAGNOSIS — Z820 Family history of epilepsy and other diseases of the nervous system: Secondary | ICD-10-CM

## 2023-08-24 DIAGNOSIS — Z90710 Acquired absence of both cervix and uterus: Secondary | ICD-10-CM | POA: Diagnosis not present

## 2023-08-24 DIAGNOSIS — Z604 Social exclusion and rejection: Secondary | ICD-10-CM | POA: Diagnosis not present

## 2023-08-24 DIAGNOSIS — I771 Stricture of artery: Secondary | ICD-10-CM | POA: Diagnosis not present

## 2023-08-24 DIAGNOSIS — R0602 Shortness of breath: Secondary | ICD-10-CM

## 2023-08-24 DIAGNOSIS — I25110 Atherosclerotic heart disease of native coronary artery with unstable angina pectoris: Principal | ICD-10-CM | POA: Diagnosis present

## 2023-08-24 LAB — CBC
HCT: 43.5 % (ref 36.0–46.0)
Hemoglobin: 14 g/dL (ref 12.0–15.0)
MCH: 29.6 pg (ref 26.0–34.0)
MCHC: 32.2 g/dL (ref 30.0–36.0)
MCV: 92 fL (ref 80.0–100.0)
Platelets: 234 10*3/uL (ref 150–400)
RBC: 4.73 MIL/uL (ref 3.87–5.11)
RDW: 13.2 % (ref 11.5–15.5)
WBC: 6.5 10*3/uL (ref 4.0–10.5)
nRBC: 0 % (ref 0.0–0.2)

## 2023-08-24 LAB — ECHOCARDIOGRAM COMPLETE
AR max vel: 1.6 cm2
AV Area VTI: 1.57 cm2
AV Area mean vel: 1.57 cm2
AV Mean grad: 9 mmHg
AV Peak grad: 17.2 mmHg
AV Vena cont: 0.27 cm
Ao pk vel: 2.08 m/s
Area-P 1/2: 2.99 cm2
P 1/2 time: 514 ms
S' Lateral: 2.59 cm

## 2023-08-24 LAB — BRAIN NATRIURETIC PEPTIDE: B Natriuretic Peptide: 76.9 pg/mL (ref 0.0–100.0)

## 2023-08-24 LAB — BASIC METABOLIC PANEL WITH GFR
Anion gap: 9 (ref 5–15)
BUN: 16 mg/dL (ref 8–23)
CO2: 26 mmol/L (ref 22–32)
Calcium: 9.6 mg/dL (ref 8.9–10.3)
Chloride: 106 mmol/L (ref 98–111)
Creatinine, Ser: 1.05 mg/dL — ABNORMAL HIGH (ref 0.44–1.00)
GFR, Estimated: 57 mL/min — ABNORMAL LOW (ref >60)
Glucose, Bld: 103 mg/dL — ABNORMAL HIGH (ref 70–99)
Potassium: 3.4 mmol/L — ABNORMAL LOW (ref 3.5–5.1)
Sodium: 141 mmol/L (ref 135–145)

## 2023-08-24 LAB — TROPONIN I (HIGH SENSITIVITY)
Troponin I (High Sensitivity): 9 ng/L (ref <18)
Troponin I (High Sensitivity): 9 ng/L (ref <18)

## 2023-08-24 LAB — MAGNESIUM: Magnesium: 2.2 mg/dL (ref 1.7–2.4)

## 2023-08-24 MED ORDER — ROSUVASTATIN CALCIUM 5 MG PO TABS
5.0000 mg | ORAL_TABLET | Freq: Every day | ORAL | Status: DC
  Administered 2023-08-25 – 2023-08-26 (×2): 5 mg via ORAL
  Filled 2023-08-24 (×2): qty 1

## 2023-08-24 MED ORDER — SODIUM CHLORIDE 0.9 % WEIGHT BASED INFUSION
1.0000 mL/kg/h | INTRAVENOUS | Status: DC
  Administered 2023-08-25: 1 mL/kg/h via INTRAVENOUS

## 2023-08-24 MED ORDER — ACETAMINOPHEN 650 MG RE SUPP: 650.0000 mg | Freq: Four times a day (QID) | RECTAL | Status: DC | PRN

## 2023-08-24 MED ORDER — POTASSIUM CHLORIDE CRYS ER 20 MEQ PO TBCR
40.0000 meq | EXTENDED_RELEASE_TABLET | Freq: Once | ORAL | Status: AC
  Administered 2023-08-24: 40 meq via ORAL
  Filled 2023-08-24: qty 2

## 2023-08-24 MED ORDER — PANTOPRAZOLE SODIUM 40 MG PO TBEC
80.0000 mg | DELAYED_RELEASE_TABLET | Freq: Every day | ORAL | Status: DC
  Administered 2023-08-25 – 2023-08-26 (×2): 80 mg via ORAL
  Filled 2023-08-24 (×2): qty 2

## 2023-08-24 MED ORDER — NITROGLYCERIN 2 % TD OINT
0.5000 [in_us] | TOPICAL_OINTMENT | Freq: Once | TRANSDERMAL | Status: AC
  Administered 2023-08-24: 0.5 [in_us] via TOPICAL
  Filled 2023-08-24: qty 1

## 2023-08-24 MED ORDER — HEPARIN BOLUS VIA INFUSION
4000.0000 [IU] | Freq: Once | INTRAVENOUS | Status: AC
  Administered 2023-08-24: 4000 [IU] via INTRAVENOUS
  Filled 2023-08-24: qty 4000

## 2023-08-24 MED ORDER — PREDNISONE 20 MG PO TABS
50.0000 mg | ORAL_TABLET | Freq: Four times a day (QID) | ORAL | Status: AC
  Administered 2023-08-24 – 2023-08-25 (×3): 50 mg via ORAL
  Filled 2023-08-24 (×3): qty 1

## 2023-08-24 MED ORDER — ACETAMINOPHEN 325 MG PO TABS: 650.0000 mg | ORAL_TABLET | Freq: Four times a day (QID) | ORAL | Status: DC | PRN

## 2023-08-24 MED ORDER — ASPIRIN 81 MG PO CHEW
81.0000 mg | CHEWABLE_TABLET | ORAL | Status: AC
  Administered 2023-08-25: 81 mg via ORAL
  Filled 2023-08-24: qty 1

## 2023-08-24 MED ORDER — HEPARIN (PORCINE) 25000 UT/250ML-% IV SOLN
1000.0000 [IU]/h | INTRAVENOUS | Status: DC
  Administered 2023-08-24: 1000 [IU]/h via INTRAVENOUS
  Filled 2023-08-24: qty 250

## 2023-08-24 MED ORDER — ASPIRIN 81 MG PO CHEW
324.0000 mg | CHEWABLE_TABLET | Freq: Once | ORAL | Status: AC
  Administered 2023-08-24: 324 mg via ORAL
  Filled 2023-08-24: qty 4

## 2023-08-24 MED ORDER — AMLODIPINE BESYLATE 5 MG PO TABS
5.0000 mg | ORAL_TABLET | Freq: Every day | ORAL | Status: DC
  Administered 2023-08-25 – 2023-08-26 (×2): 5 mg via ORAL
  Filled 2023-08-24 (×2): qty 1

## 2023-08-24 MED ORDER — SERTRALINE HCL 100 MG PO TABS
100.0000 mg | ORAL_TABLET | Freq: Every day | ORAL | Status: DC
  Administered 2023-08-25 – 2023-08-26 (×2): 100 mg via ORAL
  Filled 2023-08-24 (×2): qty 1

## 2023-08-24 MED ORDER — DIPHENHYDRAMINE HCL 25 MG PO CAPS
50.0000 mg | ORAL_CAPSULE | Freq: Once | ORAL | Status: AC
  Administered 2023-08-25: 50 mg via ORAL
  Filled 2023-08-24: qty 2

## 2023-08-24 MED ORDER — SODIUM CHLORIDE 0.9 % WEIGHT BASED INFUSION
3.0000 mL/kg/h | INTRAVENOUS | Status: DC
  Administered 2023-08-25: 3 mL/kg/h via INTRAVENOUS

## 2023-08-24 MED ORDER — CLOPIDOGREL BISULFATE 75 MG PO TABS: 75.0000 mg | ORAL_TABLET | Freq: Every day | ORAL | Status: DC

## 2023-08-24 NOTE — ED Provider Notes (Addendum)
 [place] [facility] AT [facility] Provider Note   CSN: [id number] Arrival date & time: 08/24/23  1132     Patient presents with: Chest Pain   [name] is a 73 y.o. female.    Chest Pain  73 year old female, she has a history of prior TIA on Plavix  for the last 10 years, she is on amlodipine  for blood pressure and is on cholesterol medications, she reports that for the last several weeks that she has had chest pain on exertion, she was sent for an echocardiogram by her family doctor today and the echocardiogram was reassuring but because she had abnormal EKG findings and worsening exertional symptoms she was sent to the emergency department for evaluation and likely admission.  The patient still has some mild chest discomfort in the left upper chest which is a heaviness, she does get short of breath with it but does not get nauseated or diaphoretic.    Prior to Admission medications   Medication Sig Start Date End Date Taking? Authorizing Provider  albuterol  (VENTOLIN  HFA) 108 (90 Base) MCG/ACT inhaler Inhale 2 puffs into the lungs every 6 (six) hours as needed for wheezing or shortness of breath. 01/24/20   Hunsucker, Archer Kobs, MD  amLODipine  (NORVASC ) 5 MG tablet TAKE 1 TABLET (5 MG TOTAL) BY MOUTH DAILY. 06/02/23 05/27/24  Maudine Sos, MD  amoxicillin -clavulanate (AUGMENTIN ) 875-125 MG tablet Take 1 tablet by mouth 2 (two) times daily. 07/13/23   Ivin Marrow, MD  AREXVY 120 MCG/0.5ML injection  09/06/22   [provider]  azithromycin  (ZITHROMAX ) 250 MG tablet Take 2 tablets the first day and then ONE tablet the next 4 days 07/17/23   Goble Last, MD  cetirizine  (ZYRTEC ) 10 MG tablet Take 1 tablet (10 mg total) by mouth daily with breakfast. 01/15/22 09/22/22  Goble Last, MD  clopidogrel  (PLAVIX ) 75 MG tablet TAKE 1 TABLET BY MOUTH EVERY DAY 06/02/23   Maudine Sos, MD  doxepin  (SINEQUAN ) 100 MG capsule Take 1 capsule (100 mg total) by  mouth at bedtime. 06/03/23   Lomax, Amy, NP  fluticasone  (FLONASE ) 50 MCG/ACT nasal spray INSTILL 1 SPRAY INTO BOTH NOSTRILS DAILY 08/07/23   Glenn Lange, DO  furosemide  (LASIX ) 40 MG tablet Take 1 tablet (40 mg total) by mouth daily. 01/15/22   Goble Last, MD  gabapentin  (NEURONTIN ) 600 MG tablet TAKE 1 TABLET BY MOUTH THREE TIMES A DAY 06/01/23   Kirsteins, Cecilia Coe, MD  inclisiran (LEQVIO ) 284 MG/1.5ML SOSY injection Inject as directed 10/04/21   Maudine Sos, MD  losartan  (COZAAR ) 25 MG tablet TAKE 1 TABLET BY MOUTH EVERYDAY AT BEDTIME 07/02/23   Glenn Lange, DO  ondansetron  (ZOFRAN ) 4 MG tablet Take 1 tablet (4 mg total) by mouth every 8 (eight) hours as needed for nausea or vomiting. 05/24/20   Mort Ards, MD  pantoprazole  (PROTONIX ) 40 MG tablet TAKE 1 TABLET BY MOUTH TWICE A DAY 08/07/23   Glenn Lange, DO  Rimegepant Sulfate (NURTEC) 75 MG TBDP Take 1 tablet (75 mg total) by mouth daily as needed (take for abortive therapy of migraine, no more than 1 tablet in 24 hours or 10 per month). 06/03/23   Lomax, Amy, NP  rosuvastatin  (CRESTOR ) 5 MG tablet TAKE 1 TABLET (5 MG TOTAL) BY MOUTH DAILY. 06/02/23 05/27/24  Maudine Sos, MD  sertraline  (ZOLOFT ) 100 MG tablet TAKE 1 TABLET BY MOUTH EVERY DAY 05/01/23   Glenn Lange, DO  tiZANidine  (ZANAFLEX ) 4 MG tablet Take 1 tablet (4  mg total) by mouth every 6 (six) hours as needed for muscle spasms. 07/09/23   Lomax, Amy, NP  traMADol  (ULTRAM ) 50 MG tablet TAKE 1 TABLET EVERY 8 HOURS AS NEEDED FOR MODERATE PAIN 08/14/23   Kirsteins, Cecilia Coe, MD  traZODone  (DESYREL ) 50 MG tablet TAKE 1/2 TO 1 TABLET BY MOUTH AT BEDTIME AS NEEDED FOR SLEEP 08/04/23   Glenn Lange, DO  zonisamide  (ZONEGRAN ) 100 MG capsule Take 1 capsule (100 mg total) by mouth 2 (two) times daily. 06/03/23   Lomax, Amy, NP  zonisamide  (ZONEGRAN ) 25 MG capsule take 25 mg twice daily prn in addition to 100 mg twice daily. 06/03/23   Lomax, Amy, NP    Allergies: Iodinated contrast media,  Shellfish allergy, Crestor  [rosuvastatin ], Lipitor [atorvastatin ], Statins, and Iodine    Review of Systems  Cardiovascular:  Positive for chest pain.  All other systems reviewed and are negative.   Updated Vital Signs BP 132/71   Pulse 64   Temp 98.3 F (36.8 C)   Resp 16   Ht 1.676 m (5' 6)   Wt 78.9 kg   SpO2 98%   BMI 28.08 kg/m   Physical Exam Vitals and nursing note reviewed.  Constitutional:      General: She is not in acute distress.    Appearance: She is well-developed.  HENT:     Head: Normocephalic and atraumatic.     Mouth/Throat:     Pharynx: No oropharyngeal exudate.   Eyes:     General: No scleral icterus.       Right eye: No discharge.        Left eye: No discharge.     Conjunctiva/sclera: Conjunctivae normal.     Pupils: Pupils are equal, round, and reactive to light.   Neck:     Thyroid : No thyromegaly.     Vascular: No JVD.   Cardiovascular:     Rate and Rhythm: Normal rate and regular rhythm.     Heart sounds: Normal heart sounds. No murmur heard.    No friction rub. No gallop.  Pulmonary:     Effort: Pulmonary effort is normal. No respiratory distress.     Breath sounds: Normal breath sounds. No wheezing or rales.  Abdominal:     General: Bowel sounds are normal. There is no distension.     Palpations: Abdomen is soft. There is no mass.     Tenderness: There is no abdominal tenderness.   Musculoskeletal:        General: No tenderness. Normal range of motion.     Cervical back: Normal range of motion and neck supple.     Right lower leg: No edema.     Left lower leg: No edema.  Lymphadenopathy:     Cervical: No cervical adenopathy.   Skin:    General: Skin is warm and dry.     Findings: No erythema or rash.   Neurological:     Mental Status: She is alert.     Coordination: Coordination normal.   Psychiatric:        Behavior: Behavior normal.     (all labs ordered are listed, but only abnormal results are displayed) Labs  Reviewed  BASIC METABOLIC PANEL WITH GFR - Abnormal; Notable for the following components:      Result Value   Potassium 3.4 (*)    Glucose, Bld 103 (*)    Creatinine, Ser 1.05 (*)    GFR, Estimated 57 (*)    All other components  within normal limits  CBC  BRAIN NATRIURETIC PEPTIDE  TROPONIN I (HIGH SENSITIVITY)  TROPONIN I (HIGH SENSITIVITY)    EKG: EKG Interpretation Date/Time:  Monday August 24 2023 11:43:18 EDT Ventricular Rate:  72 PR Interval:  134 QRS Duration:  80 QT Interval:  428 QTC Calculation: 468 R Axis:   77  Text Interpretation: Normal sinus rhythm ST & T wave abnormality, consider inferior ischemia ST & T wave abnormality, consider anterolateral ischemia Abnormal ECG When compared with ECG of 08-May-2023 08:10, PREVIOUS ECG IS PRESENT now has abnormal T waves Confirmed by Early Glisson ([id number]) on 08/24/2023 5:37:07 PM  Radiology: [facility] Chest 2 View Result Date: 08/24/2023 CLINICAL DATA:  cp EXAM: CHEST - 2 VIEW COMPARISON:  None available. FINDINGS: Mild elevation of the right hemidiaphragm. Nodular opacities in both lung bases on the frontal radiograph, likely nipple shadows. No focal airspace consolidation, pleural effusion, or pneumothorax. No cardiomegaly. Tortuous aorta with aortic atherosclerosis. No acute fracture or destructive lesions. Multilevel thoracic osteophytosis. Cervical fusion hardware again noted. Healed sternal body fracture. IMPRESSION: No acute cardiopulmonary abnormality. Electronically Signed   By: Rance Burrows M.D.   On: 08/24/2023 12:45   ECHOCARDIOGRAM COMPLETE Result Date: 08/24/2023    ECHOCARDIOGRAM REPORT   Patient Name:   [name] Date of Exam: 08/24/2023 Medical Rec #:  [id number]        Height:       66.0 in Accession #:    [id number]       Weight:       172.8 lb Date of Birth:  21-Nov-1950        BSA:          1.879 m Patient Age:    72 years         BP:           120/60 mmHg Patient Gender: F                HR:           64 bpm.  Exam Location:  Outpatient Procedure: 2D Echo, 3D Echo, Cardiac Doppler, Color Doppler and Strain Analysis            (Both Spectral and Color Flow Doppler were utilized during            procedure). Indications:    CHF  History:        Patient has prior history of Echocardiogram examinations, most                 recent 09/04/2022. CAD, TIA, Arrythmias:Tachycardia,                 Signs/Symptoms:Syncope; Risk Factors:Hypertension, Dyslipidemia                 and Former Smoker. Pulmonary Embolism.  Sonographer:    Gelene Kelly RDCS Referring Phys: [id number] CARINA M BROWN  Sonographer Comments: Patient reports increased shortness of breath since 06/2023. Patient states she is unable to walk more than 15 minutes without needing to stop and rest. IMPRESSIONS  1. Left ventricular ejection fraction, by estimation, is 60 to 65%. The left ventricle has normal function. The left ventricle has no regional wall motion abnormalities. Left ventricular diastolic parameters are consistent with Grade I diastolic dysfunction (impaired relaxation). The average left ventricular global longitudinal strain is -16.2 %. The global longitudinal strain is abnormal.  2. Right ventricular systolic function is normal. The right ventricular size is normal. There is normal pulmonary  artery systolic pressure. The estimated right ventricular systolic pressure is 20.6 mmHg.  3. The mitral valve is grossly normal. Trivial mitral valve regurgitation.  4. The aortic valve is tricuspid. Aortic valve regurgitation is mild. Aortic valve sclerosis/calcification is present, without any evidence of aortic stenosis.  5. The inferior vena cava is normal in size with greater than 50% respiratory variability, suggesting right atrial pressure of 3 mmHg. Comparison(s): No significant change from prior study. 09/04/2022: LVEF 60-65%. FINDINGS  Left Ventricle: Left ventricular ejection fraction, by estimation, is 60 to 65%. The left ventricle has normal function.  The left ventricle has no regional wall motion abnormalities. The average left ventricular global longitudinal strain is -16.2 %. Strain was performed and the global longitudinal strain is abnormal. 3D ejection fraction reviewed and evaluated as part of the interpretation. Alternate measurement of EF is felt to be most reflective of LV function. The left ventricular internal cavity  size was normal in size. There is no left ventricular hypertrophy. Left ventricular diastolic parameters are consistent with Grade I diastolic dysfunction (impaired relaxation). Indeterminate filling pressures. Right Ventricle: The right ventricular size is normal. No increase in right ventricular wall thickness. Right ventricular systolic function is normal. There is normal pulmonary artery systolic pressure. The tricuspid regurgitant velocity is 2.10 m/s, and  with an assumed right atrial pressure of 3 mmHg, the estimated right ventricular systolic pressure is 20.6 mmHg. Left Atrium: Left atrial size was normal in size. Right Atrium: Right atrial size was normal in size. Pericardium: There is no evidence of pericardial effusion. Mitral Valve: The mitral valve is grossly normal. Trivial mitral valve regurgitation. Tricuspid Valve: The tricuspid valve is grossly normal. Tricuspid valve regurgitation is trivial. Aortic Valve: The aortic valve is tricuspid. Aortic valve regurgitation is mild. Aortic regurgitation PHT measures 514 msec. Aortic valve sclerosis/calcification is present, without any evidence of aortic stenosis. Aortic valve mean gradient measures 9.0  mmHg. Aortic valve peak gradient measures 17.2 mmHg. Aortic valve area, by VTI measures 1.57 cm. Pulmonic Valve: The pulmonic valve was not well visualized. Pulmonic valve regurgitation is not visualized. Aorta: The aortic root and ascending aorta are structurally normal, with no evidence of dilitation. Venous: The inferior vena cava is normal in size with greater than 50%  respiratory variability, suggesting right atrial pressure of 3 mmHg. IAS/Shunts: No atrial level shunt detected by color flow Doppler. Additional Comments: 3D was performed not requiring image post processing on an independent workstation and was indeterminate.  LEFT VENTRICLE PLAX 2D LVIDd:         4.78 cm   Diastology LVIDs:         2.59 cm   LV e' medial:    6.53 cm/s LV PW:         0.74 cm   LV E/e' medial:  11.4 LV IVS:        0.71 cm   LV e' lateral:   6.96 cm/s LVOT diam:     1.90 cm   LV E/e' lateral: 10.7 LV SV:         68 LV SV Index:   36        2D Longitudinal Strain LVOT Area:     2.84 cm  2D Strain GLS (A4C):   -18.1 %                          2D Strain GLS (A3C):   -15.6 %  2D Strain GLS (A2C):   -14.9 %                          2D Strain GLS Avg:     -16.2 %                           3D Volume EF:                          3D EF:        50 %                          LV EDV:       107 ml                          LV ESV:       53 ml                          LV SV:        54 ml RIGHT VENTRICLE RV Basal diam:  2.90 cm RV Mid diam:    2.77 cm RV S prime:     12.30 cm/s TAPSE (M-mode): 1.9 cm LEFT ATRIUM             Index        RIGHT ATRIUM           Index LA diam:        3.40 cm 1.81 cm/m   RA Area:     11.80 cm LA Vol (A2C):   37.4 ml 19.90 ml/m  RA Volume:   24.70 ml  13.14 ml/m LA Vol (A4C):   45.0 ml 23.94 ml/m LA Biplane Vol: 44.4 ml 23.63 ml/m  AORTIC VALVE AV Area (Vmax):    1.60 cm AV Area (Vmean):   1.57 cm AV Area (VTI):     1.57 cm AV Vmax:           207.50 cm/s AV Vmean:          134.500 cm/s AV VTI:            0.432 m AV Peak Grad:      17.2 mmHg AV Mean Grad:      9.0 mmHg LVOT Vmax:         117.00 cm/s LVOT Vmean:        74.400 cm/s LVOT VTI:          0.240 m LVOT/AV VTI ratio: 0.55 AI PHT:            514 msec AR Vena Contracta: 0.27 cm  AORTA Ao Root diam: 2.60 cm Ao Asc diam:  3.20 cm MITRAL VALVE               TRICUSPID VALVE MV Area (PHT): 2.99 cm     TR Peak grad:   17.6 mmHg MV Decel Time: 254 msec    TR Vmax:        210.00 cm/s MV E velocity: 74.40 cm/s MV A velocity: 82.80 cm/s  SHUNTS MV E/A ratio:  0.90        Systemic VTI:  0.24 m  Systemic Diam: 1.90 cm Dinah Franco MD Electronically signed by Dinah Franco MD Signature Date/Time: 08/24/2023/11:05:58 AM    Final      .Critical Care  Performed by: Early Glisson, MD Authorized by: Early Glisson, MD   Critical care provider statement:    Critical care time (minutes):  45   Critical care time was exclusive of:  Separately billable procedures and treating other patients and teaching time   Critical care was necessary to treat or prevent imminent or life-threatening deterioration of the following conditions:  Cardiac failure   Critical care was time spent personally by me on the following activities:  Development of treatment plan with patient or surrogate, discussions with consultants, evaluation of patient's response to treatment, examination of patient, obtaining history from patient or surrogate, review of old charts, re-evaluation of patient's condition, pulse oximetry, ordering and review of radiographic studies, ordering and review of laboratory studies and ordering and performing treatments and interventions   I assumed direction of critical care for this patient from another provider in my specialty: no     Care discussed with: admitting provider   Comments:          Medications Ordered in the ED  aspirin  chewable tablet 324 mg (has no administration in time range)  nitroGLYCERIN  (NITROGLYN) 2 % ointment 0.5 inch (has no administration in time range)                                    Medical Decision Making Amount and/or Complexity of Data Reviewed Labs: ordered. Radiology: ordered.  Risk OTC drugs. Prescription drug management. Decision regarding hospitalization.    This patient presents to the ED for concern of chest pain, this  involves an extensive number of treatment options, and is a complaint that carries with it a high risk of complications and morbidity.  The differential diagnosis includes acute coronary syndrome, seems much less likely to be pulmonary embolism or dissection, heart rate is 64 and oxygen is 98% and symptoms seem to be exertional and go away with rest.  She has seen Dr. Theodis Fiscal in the past and has been diagnosed with some degree of congestive heart failure for which she does take diuretic   Co morbidities / Chronic conditions that complicate the patient evaluation  Chronic hypertension high cholesterol prior vascular disease with TIA and coronary calcification seen on CT   Additional history obtained:  Additional history obtained from EMR External records from outside source obtained and reviewed including echo record including recent echocardiogram from this morning   Lab Tests:  I Ordered, and personally interpreted labs.  The pertinent results include: 2 normal troponins   Imaging Studies ordered:  I ordered imaging studies including chest x-ray I independently visualized and interpreted imaging which showed No acute findings I agree with the radiologist interpretation   Cardiac Monitoring: / EKG:  The patient was maintained on a cardiac monitor.  I personally viewed and interpreted the cardiac monitored which showed an underlying rhythm of: Normal sinus rhythm   Problem List / ED Course / Critical interventions / Medication management  The patient has a very abnormal EKG with new inverted T waves in her lateral leads in her lateral precordial leads.  She has ongoing mild chest pain with new EKG changes which is concerning for unstable angina or possibly an non-ST elevation MI. I ordered medication including aspirin , topical nitroglycerin  Reevaluation of the patient  after these medicines showed that the patient slight improvement I have reviewed the patients home medicines and  have made adjustments as needed   Consultations Obtained:  I requested consultation with the cardiology team,  and discussed lab and imaging findings as well as pertinent plan - they recommend: They will see the patient in consultation, they believe that the patient probably needs a heart catheterization but they want the patient admitted to the medical service Will page family practice and have them come admit. Discussed with family medicine resident, they will come to admit   Social Determinants of Health:  Non-smoker Known coronary disease   Test / Admission - Considered:  Will need to be admitted to the hospital      Final diagnoses:  Unstable angina [facility])    ED Discharge Orders     None          Early Glisson, MD 08/24/23 1805    Early Glisson, MD 08/24/23 [phone]

## 2023-08-24 NOTE — Consult Note (Signed)
 Cardiology Consultation   Patient ID: [name] MRN: [id number]; DOB: Jul 07, 1950  Admit date: 08/24/2023 Date of Consult: 08/24/2023  PCP:  Glenn Lange, DO   [facility] Providers Cardiologist:  Maudine Sos, MD      Patient Profile: [name] is a 73 y.o. female with a hx of coronary calcifications on CT, CVA, hypertension, CKD, former tobacco use who is being seen 08/24/2023 for the evaluation of chest pain at the request of Dr. Annabell Key.  History of Present Illness: [name] is a 73 year old female with a history of coronary calcifications on CT, CVA, hypertension, CKD, former tobacco use who we are consulted for evaluation of chest pain by Dr. Annabell Key.  She follows with Dr. Theodis Fiscal.  Has severe multivessel coronary calcifications on CT chest.  She reports she has been having exertional chest pain.  Describes as pressure in center of chest when she exerts herself.  States that walking up stairs or trying to sweep her floors will cause her to have this pain.  She will rest and will typically resolve within 5 minutes.  However she reports it feels like it is taking less exertion for her to have the chest pain recently.  States that today she has had constant pain in her chest.  Echocardiogram today showed EF 60 to 65%, normal RV function, no significant valvular disease.  EKG today shows aVR elevation with diffuse ST depressions and T wave inversions in V4-6, new from prior EKG 04/2023.  Vitals notable for BP 121/72, pulse 72, SpO2 97% on room air.  Labs notable for potassium 3.4, creatinine 1.05, troponin 9 > 9, BNP 77, hemoglobin 14.0.   Past Medical History:  Diagnosis Date   Anemia    Cervical neuritis    Chronic kidney disease    stage 3   Dyslipidemia    GERD (gastroesophageal reflux disease)    History of transient ischemic attack (TIA)    HTN (hypertension)    Hyperlipidemia    Migraine    Migraine headache    Occipital neuralgia    Left    Pulmonary embolism (HCC) 2017   Renal insufficiency    Stroke (HCC) 10/2013    Past Surgical History:  Procedure Laterality Date   ABDOMINAL HYSTERECTOMY     ANTERIOR CERVICAL DECOMP/DISCECTOMY FUSION N/A 05/24/2020   Procedure: ANTERIOR CERVICAL DECOMPRESSION/DISCECTOMY FUSION 1 LEVEL C3-4;  Surgeon: Mort Ards, MD;  Location: [facility] OR;  Service: Orthopedics;  Laterality: N/A;  3 hrs   BILIARY STENT PLACEMENT N/A 12/12/2015   Procedure: BILIARY STENT PLACEMENT;  Surgeon: Albertina Hugger, MD;  Location: [facility] ENDOSCOPY;  Service: Endoscopy;  Laterality: N/A;   BLADDER REPAIR     CARPAL TUNNEL RELEASE     CERVICAL FUSION     CERVICAL SPINE SURGERY     CHOLECYSTECTOMY N/A 12/06/2015   Procedure: LAPAROSCOPIC CHOLECYSTECTOMY WITH  INTRAOPERATIVE CHOLANGIOGRAM;  Surgeon: Lockie Rima, MD;  Location: [facility] OR;  Service: General;  Laterality: N/A;   ELBOW SURGERY     ERCP N/A 12/12/2015   Procedure: ENDOSCOPIC RETROGRADE CHOLANGIOPANCREATOGRAPHY (ERCP);  Surgeon: Albertina Hugger, MD;  Location: [facility] ENDOSCOPY;  Service: Endoscopy;  Laterality: N/A;   ESOPHAGOGASTRODUODENOSCOPY N/A 01/28/2016   Procedure: ESOPHAGOGASTRODUODENOSCOPY (EGD) Biliary STENT removal;  Surgeon: Albertina Hugger, MD;  Location: [facility] ENDOSCOPY;  Service: Gastroenterology;  Laterality: N/A;   IR GENERIC HISTORICAL  12/17/2015   IR US  GUIDE BX ASP/DRAIN 12/17/2015 [facility]   IR GENERIC HISTORICAL  12/17/2015   IR GUIDED DRAIN W CATHETER PLACEMENT 12/17/2015 [facility]   IR GENERIC HISTORICAL  12/17/2015   IR SINUS/FIST TUBE CHK-NON GI 12/17/2015 [facility]   KNEE SURGERY     LUNG SURGERY     TEE WITHOUT CARDIOVERSION N/A 12/19/2015   Procedure: TRANSESOPHAGEAL ECHOCARDIOGRAM (TEE);  Surgeon: Loyde Rule, MD;  Location: [facility] ENDOSCOPY;  Service: Cardiovascular;  Laterality: N/A;     Scheduled Meds:  aspirin   324 mg Oral Once   nitroGLYCERIN   0.5 inch Topical Once   Continuous Infusions:  PRN Meds:   Allergies:     Allergies  Allergen Reactions   Iodinated Contrast Media Shortness Of Breath and Itching    States she had this reaction connected with a MRI of the cervical spine.   Shellfish Allergy Anaphylaxis    All seafood   Crestor  [Rosuvastatin ] Other (See Comments)    Anxiety, itching, headaches, myalgias Able to tolerate 5mg  daily   Lipitor [Atorvastatin ] Other (See Comments)    Anxiety, itching, headaches, myalgias   Statins Other (See Comments)    Arthralgias (severe) with atorvastatin , mild arthralgias with rosuvastatin  but willing to continue taking it   Iodine     Social History:   Social History   Socioeconomic History   Marital status: Married    Spouse name: [name]   Number of children: 1   Years of education: 12+   Highest education level: Professional school degree (e.g., MD, DDS, DVM, JD)  Occupational History   Occupation: ACTIVITY DIRECTOR    Employer: [employer]   Occupation: CNA   Occupation: Retired  Tobacco Use   Smoking status: Former    Current packs/day: 0.00    Average packs/day: 1.5 packs/day for 42.6 years (64.0 ttl pk-yrs)    Types: Cigarettes    Start date: 03/10/1969    Quit date: 11/01/2011    Years since quitting: 11.8   Smokeless tobacco: Never   Tobacco comments:    Smoked 1.5-2 ppd for adult life (sal[place] menthols)  Vaping Use   Vaping status: Never Used  Substance and Sexual Activity   Alcohol use: No    Alcohol/week: 0.0 standard drinks of alcohol   Drug use: No   Sexual activity: Yes    Partners: Male    Birth control/protection: Surgical    Comment: 1 sexual partner in last 12 months: married  Other Topics Concern   Not on file  Social History Narrative    Patient is married ([name]).   Patient has one child and grandchild. Her sons name is Myrtie Atkinson. Patient sees them often.   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 Patient spends 6-10 hours with her 6 days a week.    Patient has a  college education.   Patient is very active in her church.    Hobbies- outside crafts and church choir.   Social Drivers of Health   Financial Resource Strain: Medium Risk (07/17/2023)   Overall Financial Resource Strain (CARDIA)    Difficulty of Paying Living Expenses: Somewhat hard  Food Insecurity: No Food Insecurity (08/21/2023)   Hunger Vital Sign    Worried About Running Out of Food in the Last Year: Never true    Ran Out of Food in the Last Year: Never true  Recent Concern: Food Insecurity - Food Insecurity Present (07/17/2023)   Hunger Vital Sign    Worried About Running Out of Food in the Last Year:  Sometimes true    Ran Out of Food in the Last Year: Sometimes true  Transportation Needs: No Transportation Needs (08/21/2023)   PRAPARE - Administrator, Civil Service (Medical): No    Lack of Transportation (Non-Medical): No  Physical Activity: Sufficiently Active (08/21/2023)   Exercise Vital Sign    Days of Exercise per Week: 3 days    Minutes of Exercise per Session: 50 min  Recent Concern: Physical Activity - Insufficiently Active (07/17/2023)   Exercise Vital Sign    Days of Exercise per Week: 4 days    Minutes of Exercise per Session: 30 min  Stress: Stress Concern Present (08/21/2023)   [employer] of Occupational Health - Occupational Stress Questionnaire    Feeling of Stress: Very much  Social Connections: Socially Isolated (08/21/2023)   Social Connection and Isolation Panel    Frequency of Communication with Friends and Family: Once a week    Frequency of Social Gatherings with Friends and Family: Once a week    Attends Religious Services: More than 4 times per year    Active Member of [employer] or Organizations: No    Attends Banker Meetings: Not on file    Marital Status: Widowed  Intimate Partner Violence: Not At Risk (07/16/2023)   Humiliation, Afraid, Rape, and Kick questionnaire    Fear of Current or Ex-Partner: No    Emotionally Abused:  No    Physically Abused: No    Sexually Abused: No    Family History:    Family History  Problem Relation Age of Onset   Cancer Mother 18       breast   Alzheimer's disease Mother    Prostate cancer Father    Lung cancer Father    Cancer Brother    Hyperlipidemia Brother    Sleep apnea Brother    Cancer Brother    Hyperlipidemia Brother    Dementia Brother        frontotemporal dementia   Dementia Maternal Grandmother    Cancer Maternal Grandfather      ROS:  Please see the history of present illness.   All other ROS reviewed and negative.     Physical Exam/Data: Vitals:   08/24/23 1135 08/24/23 1141 08/24/23 1536  BP: 121/72  132/71  Pulse: 72  64  Resp: 20  16  Temp: 97.9 F (36.6 C)  98.3 F (36.8 C)  SpO2: 97%  98%  Weight:  78.9 kg   Height:  5' 6 (1.676 m)    No intake or output data in the 24 hours ending 08/24/23 1811    08/24/2023   11:41 AM 08/24/2023   10:41 AM 08/18/2023    8:13 AM  Last 3 Weights  Weight (lbs) 174 lb 174 lb 172 lb 12.8 oz  Weight (kg) 78.926 kg 78.926 kg 78.382 kg     Body mass index is 28.08 kg/m.  General:  in no acute distress HEENT: normal Neck: no JVD Cardiac:  normal S1, S2; RRR; no murmur  Lungs:  clear to auscultation bilaterally, no wheezing, rhonchi or rales  Abd: soft, nontender Ext: no edema Musculoskeletal:  No deformities Skin: warm and dry  Neuro:   no focal abnormalities noted Psych:  Normal affect   EKG:  The EKG was personally reviewed and demonstrates:  EKG today shows aVR elevation with diffuse ST depressions and T wave inversions in V4-6, new from prior EKG 04/2023 Telemetry:  Telemetry was personally reviewed and demonstrates:  NSR  Relevant CV Studies:   Laboratory Data: High Sensitivity Troponin:   Recent Labs  Lab 08/24/23 1144 08/24/23 1344  TROPONINIHS 9 9     Chemistry Recent Labs  Lab 08/24/23 1144  NA 141  K 3.4*  CL 106  CO2 26  GLUCOSE 103*  BUN 16  CREATININE 1.05*   CALCIUM  9.6  GFRNONAA 57*  ANIONGAP 9    No results for input(s): PROT, ALBUMIN , AST, ALT, ALKPHOS, BILITOT in the last 168 hours. Lipids No results for input(s): CHOL, TRIG, HDL, LABVLDL, LDLCALC, CHOLHDL in the last 168 hours.  Hematology Recent Labs  Lab 08/24/23 1144  WBC 6.5  RBC 4.73  HGB 14.0  HCT 43.5  MCV 92.0  MCH 29.6  MCHC 32.2  RDW 13.2  PLT 234   Thyroid  No results for input(s): TSH, FREET4 in the last 168 hours.  BNP Recent Labs  Lab 08/24/23 1347  BNP 76.9    DDimer No results for input(s): DDIMER in the last 168 hours.  Radiology/Studies:  DG Chest 2 View Result Date: 08/24/2023 CLINICAL DATA:  cp EXAM: CHEST - 2 VIEW COMPARISON:  None available. FINDINGS: Mild elevation of the right hemidiaphragm. Nodular opacities in both lung bases on the frontal radiograph, likely nipple shadows. No focal airspace consolidation, pleural effusion, or pneumothorax. No cardiomegaly. Tortuous aorta with aortic atherosclerosis. No acute fracture or destructive lesions. Multilevel thoracic osteophytosis. Cervical fusion hardware again noted. Healed sternal body fracture. IMPRESSION: No acute cardiopulmonary abnormality. Electronically Signed   By: Rance Burrows M.D.   On: 08/24/2023 12:45   ECHOCARDIOGRAM COMPLETE Result Date: 08/24/2023    ECHOCARDIOGRAM REPORT   Patient Name:   [name] Date of Exam: 08/24/2023 Medical Rec #:  [id number]        Height:       66.0 in Accession #:    [id number]       Weight:       172.8 lb Date of Birth:  26-Jul-1950        BSA:          1.879 m Patient Age:    72 years         BP:           120/60 mmHg Patient Gender: F                HR:           64 bpm. Exam Location:  Outpatient Procedure: 2D Echo, 3D Echo, Cardiac Doppler, Color Doppler and Strain Analysis            (Both Spectral and Color Flow Doppler were utilized during            procedure). Indications:    CHF  History:        Patient has prior  history of Echocardiogram examinations, most                 recent 09/04/2022. CAD, TIA, Arrythmias:Tachycardia,                 Signs/Symptoms:Syncope; Risk Factors:Hypertension, Dyslipidemia                 and Former Smoker. Pulmonary Embolism.  Sonographer:    Gelene Kelly RDCS Referring Phys: [id number] CARINA M BROWN  Sonographer Comments: Patient reports increased shortness of breath since 06/2023. Patient states she is unable to walk more than 15 minutes without needing to stop and rest. IMPRESSIONS  1. Left  ventricular ejection fraction, by estimation, is 60 to 65%. The left ventricle has normal function. The left ventricle has no regional wall motion abnormalities. Left ventricular diastolic parameters are consistent with Grade I diastolic dysfunction (impaired relaxation). The average left ventricular global longitudinal strain is -16.2 %. The global longitudinal strain is abnormal.  2. Right ventricular systolic function is normal. The right ventricular size is normal. There is normal pulmonary artery systolic pressure. The estimated right ventricular systolic pressure is 20.6 mmHg.  3. The mitral valve is grossly normal. Trivial mitral valve regurgitation.  4. The aortic valve is tricuspid. Aortic valve regurgitation is mild. Aortic valve sclerosis/calcification is present, without any evidence of aortic stenosis.  5. The inferior vena cava is normal in size with greater than 50% respiratory variability, suggesting right atrial pressure of 3 mmHg. Comparison(s): No significant change from prior study. 09/04/2022: LVEF 60-65%. FINDINGS  Left Ventricle: Left ventricular ejection fraction, by estimation, is 60 to 65%. The left ventricle has normal function. The left ventricle has no regional wall motion abnormalities. The average left ventricular global longitudinal strain is -16.2 %. Strain was performed and the global longitudinal strain is abnormal. 3D ejection fraction reviewed and evaluated as part of  the interpretation. Alternate measurement of EF is felt to be most reflective of LV function. The left ventricular internal cavity  size was normal in size. There is no left ventricular hypertrophy. Left ventricular diastolic parameters are consistent with Grade I diastolic dysfunction (impaired relaxation). Indeterminate filling pressures. Right Ventricle: The right ventricular size is normal. No increase in right ventricular wall thickness. Right ventricular systolic function is normal. There is normal pulmonary artery systolic pressure. The tricuspid regurgitant velocity is 2.10 m/s, and  with an assumed right atrial pressure of 3 mmHg, the estimated right ventricular systolic pressure is 20.6 mmHg. Left Atrium: Left atrial size was normal in size. Right Atrium: Right atrial size was normal in size. Pericardium: There is no evidence of pericardial effusion. Mitral Valve: The mitral valve is grossly normal. Trivial mitral valve regurgitation. Tricuspid Valve: The tricuspid valve is grossly normal. Tricuspid valve regurgitation is trivial. Aortic Valve: The aortic valve is tricuspid. Aortic valve regurgitation is mild. Aortic regurgitation PHT measures 514 msec. Aortic valve sclerosis/calcification is present, without any evidence of aortic stenosis. Aortic valve mean gradient measures 9.0  mmHg. Aortic valve peak gradient measures 17.2 mmHg. Aortic valve area, by VTI measures 1.57 cm. Pulmonic Valve: The pulmonic valve was not well visualized. Pulmonic valve regurgitation is not visualized. Aorta: The aortic root and ascending aorta are structurally normal, with no evidence of dilitation. Venous: The inferior vena cava is normal in size with greater than 50% respiratory variability, suggesting right atrial pressure of 3 mmHg. IAS/Shunts: No atrial level shunt detected by color flow Doppler. Additional Comments: 3D was performed not requiring image post processing on an independent workstation and was  indeterminate.  LEFT VENTRICLE PLAX 2D LVIDd:         4.78 cm   Diastology LVIDs:         2.59 cm   LV e' medial:    6.53 cm/s LV PW:         0.74 cm   LV E/e' medial:  11.4 LV IVS:        0.71 cm   LV e' lateral:   6.96 cm/s LVOT diam:     1.90 cm   LV E/e' lateral: 10.7 LV SV:         68  LV SV Index:   36        2D Longitudinal Strain LVOT Area:     2.84 cm  2D Strain GLS (A4C):   -18.1 %                          2D Strain GLS (A3C):   -15.6 %                          2D Strain GLS (A2C):   -14.9 %                          2D Strain GLS Avg:     -16.2 %                           3D Volume EF:                          3D EF:        50 %                          LV EDV:       107 ml                          LV ESV:       53 ml                          LV SV:        54 ml RIGHT VENTRICLE RV Basal diam:  2.90 cm RV Mid diam:    2.77 cm RV S prime:     12.30 cm/s TAPSE (M-mode): 1.9 cm LEFT ATRIUM             Index        RIGHT ATRIUM           Index LA diam:        3.40 cm 1.81 cm/m   RA Area:     11.80 cm LA Vol (A2C):   37.4 ml 19.90 ml/m  RA Volume:   24.70 ml  13.14 ml/m LA Vol (A4C):   45.0 ml 23.94 ml/m LA Biplane Vol: 44.4 ml 23.63 ml/m  AORTIC VALVE AV Area (Vmax):    1.60 cm AV Area (Vmean):   1.57 cm AV Area (VTI):     1.57 cm AV Vmax:           207.50 cm/s AV Vmean:          134.500 cm/s AV VTI:            0.432 m AV Peak Grad:      17.2 mmHg AV Mean Grad:      9.0 mmHg LVOT Vmax:         117.00 cm/s LVOT Vmean:        74.400 cm/s LVOT VTI:          0.240 m LVOT/AV VTI ratio: 0.55 AI PHT:            514 msec AR Vena Contracta: 0.27 cm  AORTA Ao Root diam: 2.60 cm Ao Asc diam:  3.20 cm MITRAL VALVE  TRICUSPID VALVE MV Area (PHT): 2.99 cm    TR Peak grad:   17.6 mmHg MV Decel Time: 254 msec    TR Vmax:        210.00 cm/s MV E velocity: 74.40 cm/s MV A velocity: 82.80 cm/s  SHUNTS MV E/A ratio:  0.90        Systemic VTI:  0.24 m                            Systemic Diam: 1.90 cm Dinah Franco MD Electronically signed by Dinah Franco MD Signature Date/Time: 08/24/2023/11:05:58 AM    Final      Assessment and Plan:  Chest pain: She is reporting 2 types of chest pain.  First pain has been constant discomfort that has lasted all day, and in setting of negative troponins x 2, suspect noncardiac chest pain.  However she is also reporting that she is having exertional chest pain that sounds like typical angina, as she describes exertional chest pressure that resolves with rest.  States that this has been worsening recently, as less exertion is causing her to have chest pain.  She has ischemic EKG changes, as EKG today shows aVR elevation with diffuse ST depressions and T wave inversions in V4-6, new from prior EKG 04/2023.  Echocardiogram today shows EF 60 to 65% - Given EKG changes and worsening exertional angina, will treat for unstable angina.  Will start heparin  drip.  Continue ASA.  Can hold plavix  for now in case severe multivessel disease on cath - Plan for [facility] tomorrow.  Risks and benefits of cardiac catheterization have been discussed with the patient.  These include bleeding, infection, kidney damage, stroke, heart attack, death.  The patient understands these risks and is willing to proceed. - She has contrast allergy listed but appears no issues with CTA chest in 08/2022.  She was premedicated for this with prednisone  and Benadryl .  Will premedicate for cath tomorrow. - Continue rosuvastatin  5 mg daily.  Also on inclisiran.  Check lipid panel  History of CVA: Continue ASA, statin  Hypertension: On amlodipine  5 mg daily   For questions or updates, please contact  [facility] Please consult [email or web address] for contact info under    Signed, Wendie Hamburg, MD  08/24/2023 6:11 PM

## 2023-08-24 NOTE — ED Notes (Signed)
 4 [place] RN notified that patient is on the way up to the floor .

## 2023-08-24 NOTE — Assessment & Plan Note (Signed)
 K 3.4 in ED. - Replete with KClor 40 meq - AM BMP to monitor

## 2023-08-24 NOTE — Assessment & Plan Note (Deleted)
 Worsening symptoms of chest pressure/pain especially with exertion in her upper left chest, more persistent today.  Likely progressing towards unstable angina.  Echo today demonstrated EF of 60 to 65%, G1DD, normal RV function, and no significant valvular disease.  However, EKG demonstrated aVR elevation with new T wave inversions in V4-6, and diffuse ST depressions, which are new from prior EKG in 04/2023.  Otherwise VSS, Trop trended flat, and BNP wnl. Will admit per cards to perform a RHC tomorrow d/t ischemic changes noted on EKG.   - Admit to FMTS, attending Dr. Andree Kayser - Cardiac Tele, Vital signs per floor - Heart diet, NPO at [facility] for [facility] tomorrow - PT/OT to treat - VTE prophylaxis: Heparin  - AM CBC, BMP, Lipid panel - Fall precautions - Cardiology on board, appreciate recommendations

## 2023-08-24 NOTE — Assessment & Plan Note (Signed)
 Worsening symptoms of chest pressure/pain especially with exertion in her upper left chest, more persistent today.  Likely progressing towards unstable angina.  Echo today demonstrated EF of 60 to 65%, G1DD, normal RV function, and no significant valvular disease.  However, EKG demonstrated aVR elevation with new T wave inversions in V4-6, and diffuse ST depressions, which are new from prior EKG in 04/2023.  Otherwise VSS, Trop trended flat, and BNP wnl. Will admit per cards to perform a LHC tomorrow d/t ischemic changes noted on EKG.   - Admit to FMTS, attending Dr. Andree Kayser - Cardiac Tele, Vital signs per floor - Heart diet, NPO at [facility] for [facility] 6/17 - PT/OT to treat - Heparin  gtt for ACS/NSTEMI - s/p ASA 324 mg and continue topical nitroglycerin  - AM CBC, BMP, Lipid panel - Cardiology on board, appreciate recommendations

## 2023-08-24 NOTE — Hospital Course (Addendum)
 [name] is a 73 y.o.female with a history of TIA/CVA, diastolic heart failure, HTN, CAD, and HLD who was admitted to the [facility] Medicine teaching Service at [facility] for unstable angina. Her hospital course is detailed below:  Unstable Angina Patient presented with worsening symptoms of chest pressure/pain especially with exertion and her left upper chest.  Symptoms have become progressively more persistent and occur with minimal activities.  Outpatient echo performed prior to admission revealed EF 60 to 65%, G1 DD, normal RV function, and no significant valvular disease.  However EKG demonstrated aVR elevation and new T wave inversions in leads V4-6, and diffuse ST depressions; new from prior EKG in February 2025.  Troponins trended flat and BMP within normal limits at admission.  Cardiology consulted in the ED and discussed performing a left heart cath with the patient, who is agreeable.  LHC on 6/17 revealed ***.  Other chronic conditions were medically managed with home medications and formulary alternatives as necessary (TIA/CVA, HTN, HLD, Contrast allergy)  PCP Follow-up Recommendations:

## 2023-08-24 NOTE — H&P (Cosign Needed Addendum)
 Hospital Admission History and Physical Service Pager: [phone]  Patient name: [name] Medical record number: [id number] Date of Birth: Jul 30, 1950 Age: 73 y.o. Gender: female  Primary Care Provider: Glenn Lange, DO Consultants: Cardiology Code Status: DNR/DNI Preferred Emergency Contact:  Contact Information     Name Relation Home Work Martin Son [phone]  [phone]   EMMALYN, HINSON Daughter [phone]  [phone]   Regino Caprio [phone]    Chief Complaint: CP w/ exertion  Assessment and Plan: [name] is a 73 y.o. female w/ PMHx of TIA/CVA in 2015 on Plavix  for 10 years, diastolic heart failure, HTN, severe multivessel coronary calcifications on CT chest, and HLD presenting with chest pain with exertion over the last several weeks.  Differential for this patient's presentation of this includes: Unstable Angina: Most likely secondary to symptoms of CP on exertion, usually asymptomatic at rest, but worsening today with minimal activities and new ischemic changes noted on EKG. Hx of CAD w/ coronary calcifications noted on CT previously as well.  NSTEMI: Possible with signs of ischemia with new T wave inversions and elevated aVR noted on EKG. Troponins trended flat. However, symptoms improved s/p ASA, heparin  gtt, and nitroglycerin .  Heart Failure Exacerbation: Unlikely with no dyspnea or signs of overload and normal BNP. Assessment & Plan Unstable angina (HCC) Worsening symptoms of chest pressure/pain especially with exertion in her upper left chest, more persistent today.  Likely progressing towards unstable angina.  Echo today demonstrated EF of 60 to 65%, G1DD, normal RV function, and no significant valvular disease.  However, EKG demonstrated aVR elevation with new T wave inversions in V4-6, and diffuse ST depressions, which are new from prior EKG in 04/2023.  Otherwise VSS, Trop trended flat, and BNP wnl. Will admit  per cards to perform a LHC tomorrow d/t ischemic changes noted on EKG.   - Admit to FMTS, attending Dr. Andree Kayser - Cardiac Tele, Vital signs per floor - Heart diet, NPO at [facility] for [facility] 6/17 - PT/OT to treat - Heparin  gtt for ACS/NSTEMI - s/p ASA 324 mg and continue topical nitroglycerin  - AM CBC, BMP, Lipid panel - Cardiology on board, appreciate recommendations Hypokalemia K 3.4 in ED. - Replete with KClor 40 meq - AM BMP to monitor Chronic health problem Hx of TIA and CVA: Continue home Plavix   HTN: Continue home Amlodipine  5 mg daily HLD: Continue home Rosuvastatin  5 mg daily. Leqvio  last dose in May, good for 5 months. Contrast allergy: Tolerates contrast once premedicated with Prednisone  and Benadryl , which they plan to use prior to cath. PE in 2017  FEN/GI: Heart diet, NPO at [facility] for [facility] 6/17 VTE Prophylaxis: Heparin   Disposition: Cardiac Tele  History of Present Illness:  [name] is a 73 y.o. female presenting with chest pain with exertion over the past several weeks.  Activities such as walking upstairs or sweeping her floors cause her to have this pain.  Pain usually resolves within 5 minutes after rest.  However, she endorses that minimal activity will exacerbate the pain much quicker now.  Today her pain has seemed to be constant.  Radiation of her pain to the left side of her neck and left shoulder, has improved with the nitroglycerin  patch.   Mild headaches with the nitroglycerin , otherwise last migraine this past Saturday.  Endorses SOB with activities, but resolves with rest.  Denies any blurry vision, abdominal pain, N/V/D/C, urinary symptoms or leg swelling.   In the ED,  VSS with mild hypertension.  Cardiac workup with troponins trending flat at 9, BNP within normal limits at 76.9, and CXR with no acute cardiopulmonary abnormality.  CBC within normal limits and BMP with low potassium to 3.4 and creatinine of 1.05, which seems to be at her baseline.  Patient was treated  with aspirin  324 mg and nitroglycerin .  Cardiology was consulted and recommended admission for further workup with left heart cath due to worsening stable angina.  Review Of Systems: Per HPI  Pertinent Past Medical History: TIA/CVA HTN CKD Diastolic heart failure HLD Former tobacco use  Remainder reviewed in history tab.   Pertinent Past Surgical History: Abdominal hysterectomy Biliary stent placement Cervical spine surgery Cholecystectomy Lung surgery  Remainder reviewed in history tab.  Pertinent Social History: Tobacco use: Former, 0.5-1 PPD x 25 years, quit 15 years ago Alcohol use: None Other Substance use: None Lives by herself  Pertinent Family History: Mother: Deceased; breast cancer, Alzheimer's disease Father: Deceased; prostate cancer and lung cancer Brother: Alive; cancer, HLD, sleep apnea Brother: Deceased; cancer, HLD, frontotemporal dementia  Remainder reviewed in history tab.   Important Outpatient Medications: Albuterol  2 puffs every 6 hours as needed Amlodipine  5 mg daily Biotin Calcium  Zyrtec  10 mg daily Cholecalciferol Plavix  75 mg daily Flonase  1 spray into both nostrils daily Lasix  40 mg daily Gabapentin  600 mg 3 times daily as needed for neuropathy Leqvio  284 mg subcutaneous every 6 months (last dose - May) Losartan  25 mg Zofran  4 mg every 8 hours as needed (has not used recently) Protonix  80 mg daily Nurtec 75 mg daily as needed for abortive therapy for migraines Crestor  5 mg daily Zoloft  200 mg daily Tizanidine  4 mg every 6 hours for muscle spasms as needed Tramadol  50 mg every 8 hours as needed for moderate pain Trazodone  50 mg 0.5 to 1 tablet by mouth as needed for sleep at bedtime Zonegran  100 mg twice daily Remainder reviewed in medication history.   Objective: BP 130/65   Pulse 71   Temp (!) 97.5 F (36.4 C) (Temporal)   Resp 17   Ht 5' 6 (1.676 m)   Wt 78.9 kg   SpO2 96%   BMI 28.08 kg/m  Exam: General: Awake and  Alert in NAD HEENT: NCAT. Sclera anicteric. No rhinorrhea. Cardiovascular: RRR. No M/R/G Respiratory: CTAB, normal WOB on RA. No wheezing, crackles, rhonchi, or diminished breath sounds. Abdomen: Soft, non-tender, non-distended. Bowel sounds normoactive Extremities: Able to move all extremities. No BLE edema, no deformities or significant joint findings. Skin: Warm and dry.  R arm abrasions d/t scratches from a tree. Neuro: A&Ox3. No focal neurological deficits.  Labs:  CBC BMET  Recent Labs  Lab 08/24/23 1144  WBC 6.5  HGB 14.0  HCT 43.5  PLT 234   Recent Labs  Lab 08/24/23 1144  NA 141  K 3.4*  CL 106  CO2 26  BUN 16  CREATININE 1.05*  GLUCOSE 103*  CALCIUM  9.6     GFR: 57 Troponin: 9 > 9 BNP: 76.9  EKG: My own interpretation: NSR, regular rate (64).  Borderline QTc prolongation (468 > 460).  Mild aVR elevation.  New T inversions in V4-6.  No ST elevations, however ST depressions noted diffusely.  Imaging Studies Performed: CXR My Interpretation: No cardiomegaly or vascular congestion. Mild R hemidiaphragm elevation. No signs of pleural effusion. No focal findings. Impression: No acute cardiopulmonary abnormality.  Clyda Dark, DO 08/24/2023, 8:57 PM PGY-1, [facility]  FPTS Intern pager:  [phone], text pages welcome Secure chat group [facility] Teaching Service

## 2023-08-24 NOTE — Progress Notes (Signed)
 PHARMACY - ANTICOAGULATION CONSULT NOTE  Pharmacy Consult for heparin  Indication: chest pain/ACS  Allergies  Allergen Reactions   Iodinated Contrast Media Shortness Of Breath and Itching    States she had this reaction connected with a MRI of the cervical spine.   Shellfish Allergy Anaphylaxis    All seafood   Crestor  [Rosuvastatin ] Other (See Comments)    Anxiety, itching, headaches, myalgias Able to tolerate 5mg  daily   Lipitor [Atorvastatin ] Other (See Comments)    Anxiety, itching, headaches, myalgias   Statins Other (See Comments)    Arthralgias (severe) with atorvastatin , mild arthralgias with rosuvastatin  but willing to continue taking it   Iodine     Patient Measurements: Height: 5' 6 (167.6 cm) Weight: 78.9 kg (174 lb) IBW/kg (Calculated) : 59.3 HEPARIN  DW (KG): 75.6  Vital Signs: Temp: 97.5 F (36.4 C) (06/16 1932) Temp Source: Temporal (06/16 1932) BP: 148/64 (06/16 1937) Pulse Rate: 73 (06/16 1937)  Labs: Recent Labs    08/24/23 1144 08/24/23 1344  HGB 14.0  --   HCT 43.5  --   PLT 234  --   CREATININE 1.05*  --   TROPONINIHS 9 9    Estimated Creatinine Clearance: 52.1 mL/min (A) (by C-G formula based on SCr of 1.05 mg/dL (H)).   Medical History: Past Medical History:  Diagnosis Date   Anemia    Cervical neuritis    Chronic kidney disease    stage 3   Dyslipidemia    GERD (gastroesophageal reflux disease)    History of transient ischemic attack (TIA)    HTN (hypertension)    Hyperlipidemia    Migraine    Migraine headache    Occipital neuralgia    Left   Pulmonary embolism (HCC) 2017   Renal insufficiency    Stroke (HCC) 10/2013     Assessment: 72 YOF presenting with CP, EKG changes, she is not on anticoagulation PTA, CBC wnl  Goal of Therapy:  Heparin  level 0.3-0.7 units/ml Monitor platelets by anticoagulation protocol: Yes   Plan:  Heparin  4000 units IV x 1, and gtt at 1000 units/hr F/u 8 hour heparin  level F/u cath plans  in AM  Trinidad Funk, PharmD, [facility] Clinical Pharmacist ED Pharmacist Phone # [phone] 08/24/2023 7:56 PM

## 2023-08-24 NOTE — ED Provider Triage Note (Signed)
 Emergency Medicine Provider Triage Evaluation Note  [name] , a 73 y.o. female  was evaluated in triage.  Pt complains of CP x 3 months. Takes Plavix   Review of Systems  Positive: Worse on exertion, intermittent CP, leg swelling Negative: N/V, fever, cough congestion  Physical Exam  BP 121/72   Pulse 72   Temp 97.9 F (36.6 C)   Resp 20   Ht 5' 6 (1.676 m)   Wt 78.9 kg   SpO2 97%   BMI 28.08 kg/m  Gen:   Awake, no distress   Resp:  Normal effort MSK:   Moves extremities without difficulty  Other:  No BLE edema  Medical Decision Making  Medically screening exam initiated at 1:44 PM.  Appropriate orders placed.  EVENY ANASTAS was informed that the remainder of the evaluation will be completed by another provider, this initial triage assessment does not replace that evaluation, and the importance of remaining in the ED until their evaluation is complete.  Labs ordered   Carie Charity, PA-C 08/24/23 1346

## 2023-08-24 NOTE — ED Triage Notes (Signed)
 Pt to ED c/o intermittent CP x 3 months. Reports worse with exertion. Reports had echocardiogram today, NAD in triage.

## 2023-08-24 NOTE — Progress Notes (Unsigned)
    SUBJECTIVE:   CHIEF COMPLAINT / HPI:   [name] is a 73 year old female who presents with extreme fatigue and chest pain.  She continues to experiences extreme fatigue for the past few months, feeling 'completely worn out' and 'washed out,' which is persistent and not linked to specific activities.  See previous note for details.  Since our last visit, new symptoms have developed.  Chest pain and left arm pain have been present for about a week and a half. The chest pain is described as tightness that sometimes radiates to the left arm, previously occurring with exertion and this morning occurred at rest, lasting 15 to 20 minutes.  She also experiences shortness of breath with exertion and occasional lightheadedness, especially when standing or moving suddenly. No current SOB, dizziness, nausea, vomiting, or chest pain during the visit.  Her blood pressure continues to fluctuate, she states she has been taking her losartan  about every other day which seems to maintain a normal blood pressure.  She last took furosemide  yesterday to manage swelling thought to be related to heart failure.   PERTINENT  PMH / PSH: HTN, hx TIA, migraines, history of PE, PVD, CHF, GERD, history of skin cancer, CKD stage III  OBJECTIVE:   BP 118/80   Pulse 78   Ht 5' 6 (1.676 m)   Wt 174 lb (78.9 kg)   SpO2 93%   BMI 28.08 kg/m    General: NAD, pleasant Cardiac: RRR, no murmurs. Respiratory: CTAB, normal effort, No wheezes, rales or rhonchi Extremities: no edema of BLEs MSK: Chest pain not reproducible with arm movement, no anterior wall chest pain with palpation Skin: warm and dry Neuro: alert, no obvious focal deficits Psych: Normal affect and mood  ASSESSMENT/PLAN:   Chest pain Intermittent chest and left arm pain with exertion and at rest, concerning for unstable angina and potential myocardial infarction. Advised emergency department visit due to severity and risk. Advised  transportation via EMS for safety however patient refused stating she is currently asymptomatic and feeling okay and can drive herself. Of note, patient is currently being evaluated for worsening heart failure, had echo this morning which is pending and has an appointment scheduled with cardiology for 08/28/2023. - Send to emergency department for evaluation of chest pain and possible myocardial infarction. - Patient advised to return for hospital follow-up visit whether or not she is admitted     Dr. Glenn Lange, DO [facility]    ,=

## 2023-08-24 NOTE — Patient Instructions (Signed)
 It was great to see you! Thank you for allowing me to participate in your care!  I recommend that you always bring your medications to each appointment as this makes it easy to ensure you are on the correct medications and helps us  not miss when refills are needed.  Our plans for today:  - Go to the ED right now to be worked up for chest pain  - Return for follow up visit when you are no longer in the hospital   Take care and seek immediate care sooner if you develop any concerns.   Dr. Glenn Lange, DO [facility]

## 2023-08-24 NOTE — Assessment & Plan Note (Signed)
 Hx of TIA and CVA: Continue home Plavix   HTN: Continue home Amlodipine  5 mg daily HLD: Continue home Rosuvastatin  5 mg daily. Leqvio  last dose in May, good for 5 months. Contrast allergy: Tolerates contrast once premedicated with Prednisone  and Benadryl , which they plan to use prior to cath. PE in 2017

## 2023-08-25 ENCOUNTER — Encounter (HOSPITAL_COMMUNITY): Payer: Self-pay | Admitting: Cardiology

## 2023-08-25 ENCOUNTER — Inpatient Hospital Stay (HOSPITAL_COMMUNITY)
Admission: EM | Disposition: A | Discharge status: Home / Self Care | Payer: Self-pay | Source: Home / Self Care | Attending: Family Medicine

## 2023-08-25 ENCOUNTER — Other Ambulatory Visit: Payer: Self-pay

## 2023-08-25 DIAGNOSIS — I25110 Atherosclerotic heart disease of native coronary artery with unstable angina pectoris: Secondary | ICD-10-CM | POA: Diagnosis not present

## 2023-08-25 DIAGNOSIS — R9431 Abnormal electrocardiogram [ECG] [EKG]: Secondary | ICD-10-CM | POA: Diagnosis not present

## 2023-08-25 DIAGNOSIS — R079 Chest pain, unspecified: Secondary | ICD-10-CM | POA: Insufficient documentation

## 2023-08-25 DIAGNOSIS — I200 Unstable angina: Secondary | ICD-10-CM

## 2023-08-25 DIAGNOSIS — E876 Hypokalemia: Secondary | ICD-10-CM

## 2023-08-25 HISTORY — PX: CORONARY PRESSURE/FFR STUDY: CATH118243

## 2023-08-25 HISTORY — PX: LEFT HEART CATH AND CORONARY ANGIOGRAPHY: CATH118249

## 2023-08-25 HISTORY — PX: CORONARY STENT INTERVENTION: CATH118234

## 2023-08-25 LAB — LIPID PANEL
Cholesterol: 108 mg/dL (ref 0–200)
HDL: 67 mg/dL (ref >40)
LDL Cholesterol: 32 mg/dL (ref 0–99)
Total CHOL/HDL Ratio: 1.6 ratio
Triglycerides: 44 mg/dL (ref <150)
VLDL: 9 mg/dL (ref 0–40)

## 2023-08-25 LAB — CBC
HCT: 44.2 % (ref 36.0–46.0)
HCT: 40.5 % (ref 36.0–46.0)
Hemoglobin: 14.3 g/dL (ref 12.0–15.0)
Hemoglobin: 13.4 g/dL (ref 12.0–15.0)
MCH: 28.9 pg (ref 26.0–34.0)
MCH: 29.3 pg (ref 26.0–34.0)
MCHC: 32.4 g/dL (ref 30.0–36.0)
MCHC: 33.1 g/dL (ref 30.0–36.0)
MCV: 89.3 fL (ref 80.0–100.0)
MCV: 88.6 fL (ref 80.0–100.0)
Platelets: 198 10*3/uL (ref 150–400)
Platelets: 221 10*3/uL (ref 150–400)
RBC: 4.95 MIL/uL (ref 3.87–5.11)
RBC: 4.57 MIL/uL (ref 3.87–5.11)
RDW: 13 % (ref 11.5–15.5)
RDW: 13 % (ref 11.5–15.5)
WBC: 5.7 10*3/uL (ref 4.0–10.5)
WBC: 13.6 10*3/uL — ABNORMAL HIGH (ref 4.0–10.5)
nRBC: 0 % (ref 0.0–0.2)
nRBC: 0 % (ref 0.0–0.2)

## 2023-08-25 LAB — BASIC METABOLIC PANEL WITH GFR
Anion gap: 11 (ref 5–15)
BUN: 15 mg/dL (ref 8–23)
CO2: 23 mmol/L (ref 22–32)
Calcium: 9 mg/dL (ref 8.9–10.3)
Chloride: 107 mmol/L (ref 98–111)
Creatinine, Ser: 1.2 mg/dL — ABNORMAL HIGH (ref 0.44–1.00)
GFR, Estimated: 48 mL/min — ABNORMAL LOW (ref >60)
Glucose, Bld: 159 mg/dL — ABNORMAL HIGH (ref 70–99)
Potassium: 4.1 mmol/L (ref 3.5–5.1)
Sodium: 141 mmol/L (ref 135–145)

## 2023-08-25 LAB — HEPARIN LEVEL (UNFRACTIONATED)
Heparin Unfractionated: 0.49 [IU]/mL (ref 0.30–0.70)
Heparin Unfractionated: 0.44 [IU]/mL (ref 0.30–0.70)

## 2023-08-25 LAB — CREATININE, SERUM
Creatinine, Ser: 1.68 mg/dL — ABNORMAL HIGH (ref 0.44–1.00)
GFR, Estimated: 32 mL/min — ABNORMAL LOW (ref >60)

## 2023-08-25 LAB — POCT ACTIVATED CLOTTING TIME
Activated Clotting Time: 475 s
Activated Clotting Time: 487 s

## 2023-08-25 SURGERY — LEFT HEART CATH AND CORONARY ANGIOGRAPHY
Anesthesia: LOCAL

## 2023-08-25 MED ORDER — CLOPIDOGREL BISULFATE 75 MG PO TABS
ORAL_TABLET | ORAL | Status: AC
  Filled 2023-08-25: qty 1

## 2023-08-25 MED ORDER — HEPARIN SODIUM (PORCINE) 1000 UNIT/ML IJ SOLN
INTRAMUSCULAR | Status: AC
  Filled 2023-08-25: qty 10

## 2023-08-25 MED ORDER — FUROSEMIDE 40 MG PO TABS
40.0000 mg | ORAL_TABLET | Freq: Every day | ORAL | Status: DC
  Administered 2023-08-26: 40 mg via ORAL
  Filled 2023-08-25: qty 1

## 2023-08-25 MED ORDER — LIDOCAINE HCL (PF) 1 % IJ SOLN
INTRAMUSCULAR | Status: AC
  Filled 2023-08-25: qty 30

## 2023-08-25 MED ORDER — CLOPIDOGREL BISULFATE 300 MG PO TABS: ORAL_TABLET | ORAL | Status: DC | PRN

## 2023-08-25 MED ORDER — CLOPIDOGREL BISULFATE 75 MG PO TABS
ORAL_TABLET | ORAL | Status: DC | PRN
  Administered 2023-08-25: 75 mg via ORAL

## 2023-08-25 MED ORDER — CLOPIDOGREL BISULFATE 75 MG PO TABS
75.0000 mg | ORAL_TABLET | Freq: Every day | ORAL | Status: DC
Start: 2023-08-26 — End: 2023-08-26
  Administered 2023-08-26: 75 mg via ORAL
  Filled 2023-08-25: qty 1

## 2023-08-25 MED ORDER — FENTANYL CITRATE (PF) 100 MCG/2ML IJ SOLN
INTRAMUSCULAR | Status: AC
Start: 2023-08-25 — End: 2023-08-25
  Filled 2023-08-25: qty 2

## 2023-08-25 MED ORDER — MIDAZOLAM HCL 2 MG/2ML IJ SOLN
INTRAMUSCULAR | Status: DC | PRN
  Administered 2023-08-25: 1 mg via INTRAVENOUS

## 2023-08-25 MED ORDER — NITROGLYCERIN 1 MG/10 ML FOR IR/CATH LAB
INTRA_ARTERIAL | Status: AC
  Filled 2023-08-25: qty 10

## 2023-08-25 MED ORDER — CLOPIDOGREL BISULFATE 300 MG PO TABS
ORAL_TABLET | ORAL | Status: AC
  Filled 2023-08-25: qty 2

## 2023-08-25 MED ORDER — MIDAZOLAM HCL 2 MG/2ML IJ SOLN
INTRAMUSCULAR | Status: AC
  Filled 2023-08-25: qty 2

## 2023-08-25 MED ORDER — SODIUM CHLORIDE 0.9 % IV SOLN
INTRAVENOUS | Status: AC | PRN
  Administered 2023-08-25: 10 mL/h via INTRAVENOUS

## 2023-08-25 MED ORDER — ENOXAPARIN SODIUM 40 MG/0.4ML IJ SOSY
40.0000 mg | PREFILLED_SYRINGE | INTRAMUSCULAR | Status: DC
Start: 2023-08-26 — End: 2023-08-26
  Administered 2023-08-26: 40 mg via SUBCUTANEOUS
  Filled 2023-08-25: qty 0.4

## 2023-08-25 MED ORDER — HYDRALAZINE HCL 20 MG/ML IJ SOLN
10.0000 mg | INTRAMUSCULAR | Status: AC | PRN
  Filled 2023-08-25: qty 1

## 2023-08-25 MED ORDER — SODIUM CHLORIDE 0.9% FLUSH: 3.0000 mL | INTRAVENOUS | Status: DC | PRN

## 2023-08-25 MED ORDER — IOHEXOL 350 MG/ML SOLN
INTRAVENOUS | Status: DC | PRN
  Administered 2023-08-25: 125 mL

## 2023-08-25 MED ORDER — VERAPAMIL HCL 2.5 MG/ML IV SOLN
INTRAVENOUS | Status: DC | PRN
  Administered 2023-08-25: 10 mL via INTRA_ARTERIAL

## 2023-08-25 MED ORDER — LIDOCAINE HCL (PF) 1 % IJ SOLN
INTRAMUSCULAR | Status: DC | PRN
  Administered 2023-08-25: 2 mL via INTRADERMAL

## 2023-08-25 MED ORDER — SODIUM CHLORIDE 0.9% FLUSH
3.0000 mL | Freq: Two times a day (BID) | INTRAVENOUS | Status: DC
  Administered 2023-08-25: 3 mL via INTRAVENOUS

## 2023-08-25 MED ORDER — HEPARIN SODIUM (PORCINE) 1000 UNIT/ML IJ SOLN
INTRAMUSCULAR | Status: DC | PRN
  Administered 2023-08-25 (×2): 4000 [IU] via INTRAVENOUS

## 2023-08-25 MED ORDER — ASPIRIN 81 MG PO TBEC
81.0000 mg | DELAYED_RELEASE_TABLET | Freq: Every day | ORAL | Status: DC
  Administered 2023-08-26: 81 mg via ORAL
  Filled 2023-08-25 (×2): qty 1

## 2023-08-25 MED ORDER — NITROGLYCERIN 1 MG/10 ML FOR IR/CATH LAB
INTRA_ARTERIAL | Status: DC | PRN
  Administered 2023-08-25: 200 ug via INTRACORONARY

## 2023-08-25 MED ORDER — GABAPENTIN 300 MG PO CAPS
300.0000 mg | ORAL_CAPSULE | Freq: Three times a day (TID) | ORAL | Status: DC
  Administered 2023-08-25 – 2023-08-26 (×3): 300 mg via ORAL
  Filled 2023-08-25 (×3): qty 1

## 2023-08-25 MED ORDER — HEPARIN (PORCINE) IN NACL 1000-0.9 UT/500ML-% IV SOLN
INTRAVENOUS | Status: DC | PRN
  Administered 2023-08-25 (×3): 500 mL

## 2023-08-25 MED ORDER — FENTANYL CITRATE (PF) 100 MCG/2ML IJ SOLN
INTRAMUSCULAR | Status: DC | PRN
Start: 2023-08-25 — End: 2023-08-25
  Administered 2023-08-25 (×2): 25 ug via INTRAVENOUS

## 2023-08-25 MED ORDER — VERAPAMIL HCL 2.5 MG/ML IV SOLN
INTRAVENOUS | Status: AC
  Filled 2023-08-25: qty 2

## 2023-08-25 MED ORDER — SODIUM CHLORIDE 0.9 % IV SOLN: 250.0000 mL | INTRAVENOUS | Status: DC | PRN

## 2023-08-25 SURGICAL SUPPLY — 16 items
BALLOON EMERGE MR 2.5X12 (BALLOONS) IMPLANT
BALLOON ~~LOC~~ EMERGE MR 3.25X8 (BALLOONS) IMPLANT
CATH 5FR JL3.5 JR4 ANG PIG MP (CATHETERS) IMPLANT
CATH VISTA GUIDE 6FR XBLD 3.5 (CATHETERS) IMPLANT
DEVICE RAD COMP TR BAND LRG (VASCULAR PRODUCTS) IMPLANT
GLIDESHEATH SLEND SS 6F .021 (SHEATH) IMPLANT
GUIDEWIRE INQWIRE 1.5J.035X260 (WIRE) IMPLANT
GUIDEWIRE PRESSURE X 175 (WIRE) IMPLANT
KIT ENCORE 26 ADVANTAGE (KITS) IMPLANT
KIT ESSENTIALS PG (KITS) IMPLANT
PACK CARDIAC CATHETERIZATION (CUSTOM PROCEDURE TRAY) ×1 IMPLANT
SET ATX-X65L (MISCELLANEOUS) IMPLANT
SHEATH PROBE COVER 6X72 (BAG) IMPLANT
STENT SYNERGY XD 3.0X12 (Permanent Stent) IMPLANT
TUBING CIL FLEX 10 FLL-RA (TUBING) IMPLANT
WIRE ASAHI PROWATER 180CM (WIRE) IMPLANT

## 2023-08-25 NOTE — Evaluation (Signed)
 Physical Therapy Evaluation and Discharge Patient Details Name: [name] MRN: [id number] DOB: 1950-05-08 Today's Date: 08/25/2023  History of Present Illness  73 y.o. female presenting 08/24/23 with chest pain on exertion x several weeks. PMH includes: CHF, anxiety, UTI, pulmonary embolism, pelvic fxs, insomina, OA, HTN, TIA, CVA, severe multivessel coronary calcifications on CT chest  Clinical Impression   Patient evaluated by Physical Therapy with no further acute PT needs identified. PT is signing off. Thank you for this referral.         If plan is discharge home, recommend the following:     Can travel by private vehicle        Equipment Recommendations None recommended by PT  Recommendations for Other Services       Functional Status Assessment Patient has not had a recent decline in their functional status     Precautions / Restrictions Precautions Precautions: None      Mobility  Bed Mobility Overal bed mobility: Independent                  Transfers Overall transfer level: Independent Equipment used: None                    Ambulation/Gait Ambulation/Gait assistance: Independent Gait Distance (Feet): 360 Feet Assistive device: None Gait Pattern/deviations: WFL(Within Functional Limits)   Gait velocity interpretation: >2.62 ft/sec, indicative of community ambulatory      Stairs            Wheelchair Mobility     Tilt Bed    Modified Rankin (Stroke Patients Only)       Balance Overall balance assessment: Independent                               Standardized Balance Assessment Standardized Balance Assessment : Dynamic Gait Index   Dynamic Gait Index Level Surface: Normal Change in Gait Speed: Normal Gait with Horizontal Head Turns: Normal Gait and Pivot Turn: Normal       Pertinent Vitals/Pain Pain Assessment Pain Assessment: 0-10 Pain Score: 4  Pain Location: left chest/shoulder Pain  Descriptors / Indicators: Pressure Pain Intervention(s): Limited activity within patient's tolerance, Monitored during session    Home Living Family/patient expects to be discharged to:: Private residence Living Arrangements: Alone Available Help at Discharge: Family;Available PRN/intermittently Type of Home: House Home Access: Stairs to enter Entrance Stairs-Rails: Right Entrance Stairs-Number of Steps: 3   Home Layout: One level Home Equipment: Agricultural consultant (2 wheels);Cane - quad;Shower seat;Grab bars - tub/shower      Prior Function Prior Level of Function : Independent/Modified Independent;Driving             Mobility Comments: works part-time at [employer]       Extremity/Trunk Assessment   Upper Extremity Assessment Upper Extremity Assessment: Overall WFL for tasks assessed    Lower Extremity Assessment Lower Extremity Assessment: Overall WFL for tasks assessed    Cervical / Trunk Assessment Cervical / Trunk Assessment: Normal  Communication   Communication Communication: No apparent difficulties    Cognition Arousal: Alert Behavior During Therapy: WFL for tasks assessed/performed   PT - Cognitive impairments: No apparent impairments                         Following commands: Intact       Cueing Cueing Techniques: Verbal cues     General Comments  Exercises     Assessment/Plan    PT Assessment Patient does not need any further PT services  PT Problem List         PT Treatment Interventions      PT Goals (Current goals can be found in the Care Plan section)  Acute Rehab PT Goals PT Goal Formulation: All assessment and education complete, DC therapy    Frequency       Co-evaluation               AM-PAC PT 6 Clicks Mobility  Outcome Measure Help needed turning from your back to your side while in a flat bed without using bedrails?: None Help needed moving from lying on your back to sitting on the side of  a flat bed without using bedrails?: None Help needed moving to and from a bed to a chair (including a wheelchair)?: None Help needed standing up from a chair using your arms (e.g., wheelchair or bedside chair)?: None Help needed to walk in hospital room?: None Help needed climbing 3-5 steps with a railing? : None 6 Click Score: 24    End of Session   Activity Tolerance: Patient tolerated treatment well Patient left: in bed;with call bell/phone within reach;with bed alarm set Nurse Communication: Mobility status;Other (comment) (?ok to get up unassisted) PT Visit Diagnosis: Difficulty in walking, not elsewhere classified (R26.2)    Time: 5621-3086 PT Time Calculation (min) (ACUTE ONLY): 19 min   Charges:   PT Evaluation $PT Eval Low Complexity: 1 Low   PT General Charges $$ ACUTE PT VISIT: 1 Visit          Gayle Kava, PT Acute Rehabilitation Services  Office [phone]   Guilford Leep 08/25/2023, 9:58 AM

## 2023-08-25 NOTE — Care Plan (Signed)
 FMTS Interim Progress Note  S: Patient is doing well status post LHC.  No concerns at this time.  She feels like it went well and understands that a stent was needed.   O: BP 119/61 (BP Location: Left Arm)   Pulse 91   Temp 98.3 F (36.8 C) (Oral)   Resp 20   Ht 5' 6 (1.676 m)   Wt 77.3 kg   SpO2 96%   BMI 27.50 kg/m   General: Awake and Alert in NAD HEENT: NCAT. Sclera anicteric. No rhinorrhea. Cardiovascular: RRR. No M/R/G Respiratory: CTAB, normal WOB on RA. No wheezing, crackles, rhonchi, or diminished breath sounds. Abdomen: Soft, non-tender, non-distended. Bowel sounds normoactive Extremities/Skin: RUE resting on pillow, mild bleeding noted under radial site for LHC.  A/P: Unstable Angina S/p LHC which revealed LAD 85% stenosed, RCA 10% stenosed, Mid Cx 15% stenosed, drug-eluting stent placed in LAD.  - DAPT for at least 12 months - Continue Rosuvastatin  5 mg and Leqvio   - Rest of plan per day team progress note  Clyda Dark, DO 08/25/2023, 8:12 PM PGY-1, [facility] Family Medicine Service pager [phone]

## 2023-08-25 NOTE — Progress Notes (Signed)
 Rounding Note   Patient Name: [name] Date of Encounter: 08/25/2023  [facility] Cardiologist: Maudine Sos, MD   Subjective Reports chest pain improved.  Denies any dyspnea.  BP 132/66. Cr 1.2, Hgb 14.3  Scheduled Meds:  amLODipine   5 mg Oral Daily   aspirin  EC  81 mg Oral Daily   diphenhydrAMINE   50 mg Oral Once   pantoprazole   80 mg Oral Daily   predniSONE   50 mg Oral Q6H   rosuvastatin   5 mg Oral Daily   sertraline   100 mg Oral Daily   Continuous Infusions:  sodium chloride  1 mL/kg/hr (08/25/23 0541)   heparin  1,000 Units/hr (08/24/23 2013)   PRN Meds: acetaminophen  **OR** acetaminophen    Vital Signs  Vitals:   08/25/23 0403 08/25/23 0500 08/25/23 0731 08/25/23 0822  BP: 132/63  132/66 132/66  Pulse: 67 77 66   Resp: 19 (!) 21 16   Temp: 97.8 F (36.6 C)  98 F (36.7 C)   TempSrc: Oral  Oral   SpO2: 91% 97% 98%   Weight:  77.3 kg    Height:        Intake/Output Summary (Last 24 hours) at 08/25/2023 0918 Last data filed at 08/25/2023 0500 Gross per 24 hour  Intake 0 ml  Output --  Net 0 ml      08/25/2023    5:00 AM 08/24/2023   11:41 AM 08/24/2023   10:41 AM  Last 3 Weights  Weight (lbs) 170 lb 6.4 oz 174 lb 174 lb  Weight (kg) 77.293 kg 78.926 kg 78.926 kg      Telemetry NSR - Personally Reviewed  ECG  No new ECG - Personally Reviewed  Physical Exam  GEN: No acute distress.   Neck: No JVD Cardiac: RRR, 2/6 systolic murmur Respiratory: Clear to auscultation bilaterally. GI: Soft, nontender, non-distended  MS: No edema; No deformity. Neuro:  Nonfocal  Psych: Normal affect   Labs High Sensitivity Troponin:   Recent Labs  Lab 08/24/23 1144 08/24/23 1344  TROPONINIHS 9 9     Chemistry Recent Labs  Lab 08/24/23 1144 08/24/23 2138 08/25/23 0339  NA 141  --  141  K 3.4*  --  4.1  CL 106  --  107  CO2 26  --  23  GLUCOSE 103*  --  159*  BUN 16  --  15  CREATININE 1.05*  --  1.20*  CALCIUM  9.6  --  9.0   MG  --  2.2  --   GFRNONAA 57*  --  48*  ANIONGAP 9  --  11    Lipids  Recent Labs  Lab 08/25/23 0339  CHOL 108  TRIG 44  HDL 67  LDLCALC 32  CHOLHDL 1.6    Hematology Recent Labs  Lab 08/24/23 1144 08/25/23 0339  WBC 6.5 5.7  RBC 4.73 4.95  HGB 14.0 14.3  HCT 43.5 44.2  MCV 92.0 89.3  MCH 29.6 28.9  MCHC 32.2 32.4  RDW 13.2 13.0  PLT 234 198   Thyroid  No results for input(s): TSH, FREET4 in the last 168 hours.  BNP Recent Labs  Lab 08/24/23 1347  BNP 76.9    DDimer No results for input(s): DDIMER in the last 168 hours.   Radiology  DG Chest 2 View Result Date: 08/24/2023 CLINICAL DATA:  cp EXAM: CHEST - 2 VIEW COMPARISON:  None available. FINDINGS: Mild elevation of the right hemidiaphragm. Nodular opacities in both lung bases on the frontal radiograph,  likely nipple shadows. No focal airspace consolidation, pleural effusion, or pneumothorax. No cardiomegaly. Tortuous aorta with aortic atherosclerosis. No acute fracture or destructive lesions. Multilevel thoracic osteophytosis. Cervical fusion hardware again noted. Healed sternal body fracture. IMPRESSION: No acute cardiopulmonary abnormality. Electronically Signed   By: Rance Burrows M.D.   On: 08/24/2023 12:45   ECHOCARDIOGRAM COMPLETE Result Date: 08/24/2023    ECHOCARDIOGRAM REPORT   Patient Name:   [name] Date of Exam: 08/24/2023 Medical Rec #:  [id number]        Height:       66.0 in Accession #:    [id number]       Weight:       172.8 lb Date of Birth:  [birth year]        BSA:          1.879 m Patient Age:    73 years         BP:           120/60 mmHg Patient Gender: F                HR:           64 bpm. Exam Location:  Outpatient Procedure: 2D Echo, 3D Echo, Cardiac Doppler, Color Doppler and Strain Analysis            (Both Spectral and Color Flow Doppler were utilized during            procedure). Indications:    CHF  History:        Patient has prior history of Echocardiogram examinations, most                  recent 09/04/2022. CAD, TIA, Arrythmias:Tachycardia,                 Signs/Symptoms:Syncope; Risk Factors:Hypertension, Dyslipidemia                 and Former Smoker. Pulmonary Embolism.  Sonographer:    Gelene Kelly RDCS Referring Phys: [id number] CARINA M BROWN  Sonographer Comments: Patient reports increased shortness of breath since 06/2023. Patient states she is unable to walk more than 15 minutes without needing to stop and rest. IMPRESSIONS  1. Left ventricular ejection fraction, by estimation, is 60 to 65%. The left ventricle has normal function. The left ventricle has no regional wall motion abnormalities. Left ventricular diastolic parameters are consistent with Grade I diastolic dysfunction (impaired relaxation). The average left ventricular global longitudinal strain is -16.2 %. The global longitudinal strain is abnormal.  2. Right ventricular systolic function is normal. The right ventricular size is normal. There is normal pulmonary artery systolic pressure. The estimated right ventricular systolic pressure is 20.6 mmHg.  3. The mitral valve is grossly normal. Trivial mitral valve regurgitation.  4. The aortic valve is tricuspid. Aortic valve regurgitation is mild. Aortic valve sclerosis/calcification is present, without any evidence of aortic stenosis.  5. The inferior vena cava is normal in size with greater than 50% respiratory variability, suggesting right atrial pressure of 3 mmHg. Comparison(s): No significant change from prior study. 09/04/2022: LVEF 60-65%. FINDINGS  Left Ventricle: Left ventricular ejection fraction, by estimation, is 60 to 65%. The left ventricle has normal function. The left ventricle has no regional wall motion abnormalities. The average left ventricular global longitudinal strain is -16.2 %. Strain was performed and the global longitudinal strain is abnormal. 3D ejection fraction reviewed and evaluated as part of the interpretation. Alternate measurement of  EF  is felt to be most reflective of LV function. The left ventricular internal cavity  size was normal in size. There is no left ventricular hypertrophy. Left ventricular diastolic parameters are consistent with Grade I diastolic dysfunction (impaired relaxation). Indeterminate filling pressures. Right Ventricle: The right ventricular size is normal. No increase in right ventricular wall thickness. Right ventricular systolic function is normal. There is normal pulmonary artery systolic pressure. The tricuspid regurgitant velocity is 2.10 m/s, and  with an assumed right atrial pressure of 3 mmHg, the estimated right ventricular systolic pressure is 20.6 mmHg. Left Atrium: Left atrial size was normal in size. Right Atrium: Right atrial size was normal in size. Pericardium: There is no evidence of pericardial effusion. Mitral Valve: The mitral valve is grossly normal. Trivial mitral valve regurgitation. Tricuspid Valve: The tricuspid valve is grossly normal. Tricuspid valve regurgitation is trivial. Aortic Valve: The aortic valve is tricuspid. Aortic valve regurgitation is mild. Aortic regurgitation PHT measures 514 msec. Aortic valve sclerosis/calcification is present, without any evidence of aortic stenosis. Aortic valve mean gradient measures 9.0  mmHg. Aortic valve peak gradient measures 17.2 mmHg. Aortic valve area, by VTI measures 1.57 cm. Pulmonic Valve: The pulmonic valve was not well visualized. Pulmonic valve regurgitation is not visualized. Aorta: The aortic root and ascending aorta are structurally normal, with no evidence of dilitation. Venous: The inferior vena cava is normal in size with greater than 50% respiratory variability, suggesting right atrial pressure of 3 mmHg. IAS/Shunts: No atrial level shunt detected by color flow Doppler. Additional Comments: 3D was performed not requiring image post processing on an independent workstation and was indeterminate.  LEFT VENTRICLE PLAX 2D LVIDd:          4.78 cm   Diastology LVIDs:         2.59 cm   LV e' medial:    6.53 cm/s LV PW:         0.74 cm   LV E/e' medial:  11.4 LV IVS:        0.71 cm   LV e' lateral:   6.96 cm/s LVOT diam:     1.90 cm   LV E/e' lateral: 10.7 LV SV:         68 LV SV Index:   36        2D Longitudinal Strain LVOT Area:     2.84 cm  2D Strain GLS (A4C):   -18.1 %                          2D Strain GLS (A3C):   -15.6 %                          2D Strain GLS (A2C):   -14.9 %                          2D Strain GLS Avg:     -16.2 %                           3D Volume EF:                          3D EF:        50 %  LV EDV:       107 ml                          LV ESV:       53 ml                          LV SV:        54 ml RIGHT VENTRICLE RV Basal diam:  2.90 cm RV Mid diam:    2.77 cm RV S prime:     12.30 cm/s TAPSE (M-mode): 1.9 cm LEFT ATRIUM             Index        RIGHT ATRIUM           Index LA diam:        3.40 cm 1.81 cm/m   RA Area:     11.80 cm LA Vol (A2C):   37.4 ml 19.90 ml/m  RA Volume:   24.70 ml  13.14 ml/m LA Vol (A4C):   45.0 ml 23.94 ml/m LA Biplane Vol: 44.4 ml 23.63 ml/m  AORTIC VALVE AV Area (Vmax):    1.60 cm AV Area (Vmean):   1.57 cm AV Area (VTI):     1.57 cm AV Vmax:           207.50 cm/s AV Vmean:          134.500 cm/s AV VTI:            0.432 m AV Peak Grad:      17.2 mmHg AV Mean Grad:      9.0 mmHg LVOT Vmax:         117.00 cm/s LVOT Vmean:        74.400 cm/s LVOT VTI:          0.240 m LVOT/AV VTI ratio: 0.55 AI PHT:            514 msec AR Vena Contracta: 0.27 cm  AORTA Ao Root diam: 2.60 cm Ao Asc diam:  3.20 cm MITRAL VALVE               TRICUSPID VALVE MV Area (PHT): 2.99 cm    TR Peak grad:   17.6 mmHg MV Decel Time: 254 msec    TR Vmax:        210.00 cm/s MV E velocity: 74.40 cm/s MV A velocity: 82.80 cm/s  SHUNTS MV E/A ratio:  0.90        Systemic VTI:  0.24 m                            Systemic Diam: 1.90 cm Dinah Franco MD Electronically signed by Dinah Franco MD  Signature Date/Time: 08/24/2023/11:05:58 AM    Final     Cardiac Studies   Patient Profile   73 y.o. female with a hx of coronary calcifications on CT, CVA, hypertension, CKD, former tobacco use who is being seen for the evaluation of chest pain   Assessment & Plan  Chest pain: She is reporting 2 types of chest pain.  First pain has been constant discomfort that has lasted all day, and in setting of negative troponins x 2, suspect noncardiac chest pain.  However she is also reporting that she is having exertional chest pain that sounds like typical angina, as she describes exertional chest pressure that resolves  with rest.  States that this has been worsening recently, as less exertion is causing her to have chest pain.  She has ischemic EKG changes, as EKG today shows aVR elevation with diffuse ST depressions and T wave inversions in V4-6, new from prior EKG 04/2023.  Echocardiogram shows EF 60 to 65%.  Recent CT chest with severe multivessel coronary calcifications.  - Given EKG changes and worsening exertional angina, treating for unstable angina.  Continue heparin  drip.  Continue ASA.  Can hold plavix  for now in case severe multivessel disease on cath - Plan for [facility] today.  Risks and benefits of cardiac catheterization have been discussed with the patient.  These include bleeding, infection, kidney damage, stroke, heart attack, death.  The patient understands these risks and is willing to proceed. - She has contrast allergy listed but appears no issues with CTA chest in 08/2022.  She was premedicated for this with prednisone  and Benadryl .  Premedicating for cath - Continue rosuvastatin  5 mg daily.  Also on inclisiran.  LDL 32   History of CVA: Continue ASA, statin   Hypertension: On amlodipine  5 mg daily  For questions or updates, please contact [facility] Please consult [email or web address] for contact info under     Signed, Wendie Hamburg, MD  08/25/2023, 9:18 AM

## 2023-08-25 NOTE — Interval H&P Note (Signed)
 History and Physical Interval Note:  08/25/2023 3:37 PM  [name]  has presented today for surgery, with the diagnosis of unstable angina.  The various methods of treatment have been discussed with the patient and family. After consideration of risks, benefits and other options for treatment, the patient has consented to  Procedure(s): LEFT HEART CATH AND CORONARY ANGIOGRAPHY (N/A) as a surgical intervention.  The patient's history has been reviewed, patient examined, no change in status, stable for surgery.  I have reviewed the patient's chart and labs.  Questions were answered to the patient's satisfaction.    Cath Lab Visit (complete for each Cath Lab visit)  Clinical Evaluation Leading to the Procedure:   ACS: Yes.    Non-ACS:    Anginal Classification: CCS III  Anti-ischemic medical therapy: Minimal Therapy (1 class of medications)  Non-Invasive Test Results: No non-invasive testing performed  Prior CABG: No previous CABG       Donata Fryer [facility] 08/25/2023 3:37 PM

## 2023-08-25 NOTE — H&P (View-Only) (Signed)
 Rounding Note   Patient Name: [name] Date of Encounter: 08/25/2023  [facility] Cardiologist: Maudine Sos, MD   Subjective Reports chest pain improved.  Denies any dyspnea.  BP 132/66. Cr 1.2, Hgb 14.3  Scheduled Meds:  amLODipine   5 mg Oral Daily   aspirin  EC  81 mg Oral Daily   diphenhydrAMINE   50 mg Oral Once   pantoprazole   80 mg Oral Daily   predniSONE   50 mg Oral Q6H   rosuvastatin   5 mg Oral Daily   sertraline   100 mg Oral Daily   Continuous Infusions:  sodium chloride  1 mL/kg/hr (08/25/23 0541)   heparin  1,000 Units/hr (08/24/23 2013)   PRN Meds: acetaminophen  **OR** acetaminophen    Vital Signs  Vitals:   08/25/23 0403 08/25/23 0500 08/25/23 0731 08/25/23 0822  BP: 132/63  132/66 132/66  Pulse: 67 77 66   Resp: 19 (!) 21 16   Temp: 97.8 F (36.6 C)  98 F (36.7 C)   TempSrc: Oral  Oral   SpO2: 91% 97% 98%   Weight:  77.3 kg    Height:        Intake/Output Summary (Last 24 hours) at 08/25/2023 0918 Last data filed at 08/25/2023 0500 Gross per 24 hour  Intake 0 ml  Output --  Net 0 ml      08/25/2023    5:00 AM 08/24/2023   11:41 AM 08/24/2023   10:41 AM  Last 3 Weights  Weight (lbs) 170 lb 6.4 oz 174 lb 174 lb  Weight (kg) 77.293 kg 78.926 kg 78.926 kg      Telemetry NSR - Personally Reviewed  ECG  No new ECG - Personally Reviewed  Physical Exam  GEN: No acute distress.   Neck: No JVD Cardiac: RRR, 2/6 systolic murmur Respiratory: Clear to auscultation bilaterally. GI: Soft, nontender, non-distended  MS: No edema; No deformity. Neuro:  Nonfocal  Psych: Normal affect   Labs High Sensitivity Troponin:   Recent Labs  Lab 08/24/23 1144 08/24/23 1344  TROPONINIHS 9 9     Chemistry Recent Labs  Lab 08/24/23 1144 08/24/23 2138 08/25/23 0339  NA 141  --  141  K 3.4*  --  4.1  CL 106  --  107  CO2 26  --  23  GLUCOSE 103*  --  159*  BUN 16  --  15  CREATININE 1.05*  --  1.20*  CALCIUM  9.6  --  9.0   MG  --  2.2  --   GFRNONAA 57*  --  48*  ANIONGAP 9  --  11    Lipids  Recent Labs  Lab 08/25/23 0339  CHOL 108  TRIG 44  HDL 67  LDLCALC 32  CHOLHDL 1.6    Hematology Recent Labs  Lab 08/24/23 1144 08/25/23 0339  WBC 6.5 5.7  RBC 4.73 4.95  HGB 14.0 14.3  HCT 43.5 44.2  MCV 92.0 89.3  MCH 29.6 28.9  MCHC 32.2 32.4  RDW 13.2 13.0  PLT 234 198   Thyroid  No results for input(s): TSH, FREET4 in the last 168 hours.  BNP Recent Labs  Lab 08/24/23 1347  BNP 76.9    DDimer No results for input(s): DDIMER in the last 168 hours.   Radiology  DG Chest 2 View Result Date: 08/24/2023 CLINICAL DATA:  cp EXAM: CHEST - 2 VIEW COMPARISON:  None available. FINDINGS: Mild elevation of the right hemidiaphragm. Nodular opacities in both lung bases on the frontal radiograph,  likely nipple shadows. No focal airspace consolidation, pleural effusion, or pneumothorax. No cardiomegaly. Tortuous aorta with aortic atherosclerosis. No acute fracture or destructive lesions. Multilevel thoracic osteophytosis. Cervical fusion hardware again noted. Healed sternal body fracture. IMPRESSION: No acute cardiopulmonary abnormality. Electronically Signed   By: Rance Burrows M.D.   On: 08/24/2023 12:45   ECHOCARDIOGRAM COMPLETE Result Date: 08/24/2023    ECHOCARDIOGRAM REPORT   Patient Name:   [name] Date of Exam: 08/24/2023 Medical Rec #:  [id number]        Height:       66.0 in Accession #:    [id number]       Weight:       172.8 lb Date of Birth:  [birth year]        BSA:          1.879 m Patient Age:    73 years         BP:           120/60 mmHg Patient Gender: F                HR:           64 bpm. Exam Location:  Outpatient Procedure: 2D Echo, 3D Echo, Cardiac Doppler, Color Doppler and Strain Analysis            (Both Spectral and Color Flow Doppler were utilized during            procedure). Indications:    CHF  History:        Patient has prior history of Echocardiogram examinations, most                  recent 09/04/2022. CAD, TIA, Arrythmias:Tachycardia,                 Signs/Symptoms:Syncope; Risk Factors:Hypertension, Dyslipidemia                 and Former Smoker. Pulmonary Embolism.  Sonographer:    Gelene Kelly RDCS Referring Phys: [id number] CARINA M BROWN  Sonographer Comments: Patient reports increased shortness of breath since 06/2023. Patient states she is unable to walk more than 15 minutes without needing to stop and rest. IMPRESSIONS  1. Left ventricular ejection fraction, by estimation, is 60 to 65%. The left ventricle has normal function. The left ventricle has no regional wall motion abnormalities. Left ventricular diastolic parameters are consistent with Grade I diastolic dysfunction (impaired relaxation). The average left ventricular global longitudinal strain is -16.2 %. The global longitudinal strain is abnormal.  2. Right ventricular systolic function is normal. The right ventricular size is normal. There is normal pulmonary artery systolic pressure. The estimated right ventricular systolic pressure is 20.6 mmHg.  3. The mitral valve is grossly normal. Trivial mitral valve regurgitation.  4. The aortic valve is tricuspid. Aortic valve regurgitation is mild. Aortic valve sclerosis/calcification is present, without any evidence of aortic stenosis.  5. The inferior vena cava is normal in size with greater than 50% respiratory variability, suggesting right atrial pressure of 3 mmHg. Comparison(s): No significant change from prior study. 09/04/2022: LVEF 60-65%. FINDINGS  Left Ventricle: Left ventricular ejection fraction, by estimation, is 60 to 65%. The left ventricle has normal function. The left ventricle has no regional wall motion abnormalities. The average left ventricular global longitudinal strain is -16.2 %. Strain was performed and the global longitudinal strain is abnormal. 3D ejection fraction reviewed and evaluated as part of the interpretation. Alternate measurement of  EF  is felt to be most reflective of LV function. The left ventricular internal cavity  size was normal in size. There is no left ventricular hypertrophy. Left ventricular diastolic parameters are consistent with Grade I diastolic dysfunction (impaired relaxation). Indeterminate filling pressures. Right Ventricle: The right ventricular size is normal. No increase in right ventricular wall thickness. Right ventricular systolic function is normal. There is normal pulmonary artery systolic pressure. The tricuspid regurgitant velocity is 2.10 m/s, and  with an assumed right atrial pressure of 3 mmHg, the estimated right ventricular systolic pressure is 20.6 mmHg. Left Atrium: Left atrial size was normal in size. Right Atrium: Right atrial size was normal in size. Pericardium: There is no evidence of pericardial effusion. Mitral Valve: The mitral valve is grossly normal. Trivial mitral valve regurgitation. Tricuspid Valve: The tricuspid valve is grossly normal. Tricuspid valve regurgitation is trivial. Aortic Valve: The aortic valve is tricuspid. Aortic valve regurgitation is mild. Aortic regurgitation PHT measures 514 msec. Aortic valve sclerosis/calcification is present, without any evidence of aortic stenosis. Aortic valve mean gradient measures 9.0  mmHg. Aortic valve peak gradient measures 17.2 mmHg. Aortic valve area, by VTI measures 1.57 cm. Pulmonic Valve: The pulmonic valve was not well visualized. Pulmonic valve regurgitation is not visualized. Aorta: The aortic root and ascending aorta are structurally normal, with no evidence of dilitation. Venous: The inferior vena cava is normal in size with greater than 50% respiratory variability, suggesting right atrial pressure of 3 mmHg. IAS/Shunts: No atrial level shunt detected by color flow Doppler. Additional Comments: 3D was performed not requiring image post processing on an independent workstation and was indeterminate.  LEFT VENTRICLE PLAX 2D LVIDd:          4.78 cm   Diastology LVIDs:         2.59 cm   LV e' medial:    6.53 cm/s LV PW:         0.74 cm   LV E/e' medial:  11.4 LV IVS:        0.71 cm   LV e' lateral:   6.96 cm/s LVOT diam:     1.90 cm   LV E/e' lateral: 10.7 LV SV:         68 LV SV Index:   36        2D Longitudinal Strain LVOT Area:     2.84 cm  2D Strain GLS (A4C):   -18.1 %                          2D Strain GLS (A3C):   -15.6 %                          2D Strain GLS (A2C):   -14.9 %                          2D Strain GLS Avg:     -16.2 %                           3D Volume EF:                          3D EF:        50 %  LV EDV:       107 ml                          LV ESV:       53 ml                          LV SV:        54 ml RIGHT VENTRICLE RV Basal diam:  2.90 cm RV Mid diam:    2.77 cm RV S prime:     12.30 cm/s TAPSE (M-mode): 1.9 cm LEFT ATRIUM             Index        RIGHT ATRIUM           Index LA diam:        3.40 cm 1.81 cm/m   RA Area:     11.80 cm LA Vol (A2C):   37.4 ml 19.90 ml/m  RA Volume:   24.70 ml  13.14 ml/m LA Vol (A4C):   45.0 ml 23.94 ml/m LA Biplane Vol: 44.4 ml 23.63 ml/m  AORTIC VALVE AV Area (Vmax):    1.60 cm AV Area (Vmean):   1.57 cm AV Area (VTI):     1.57 cm AV Vmax:           207.50 cm/s AV Vmean:          134.500 cm/s AV VTI:            0.432 m AV Peak Grad:      17.2 mmHg AV Mean Grad:      9.0 mmHg LVOT Vmax:         117.00 cm/s LVOT Vmean:        74.400 cm/s LVOT VTI:          0.240 m LVOT/AV VTI ratio: 0.55 AI PHT:            514 msec AR Vena Contracta: 0.27 cm  AORTA Ao Root diam: 2.60 cm Ao Asc diam:  3.20 cm MITRAL VALVE               TRICUSPID VALVE MV Area (PHT): 2.99 cm    TR Peak grad:   17.6 mmHg MV Decel Time: 254 msec    TR Vmax:        210.00 cm/s MV E velocity: 74.40 cm/s MV A velocity: 82.80 cm/s  SHUNTS MV E/A ratio:  0.90        Systemic VTI:  0.24 m                            Systemic Diam: 1.90 cm Dinah Franco MD Electronically signed by Dinah Franco MD  Signature Date/Time: 08/24/2023/11:05:58 AM    Final     Cardiac Studies   Patient Profile   73 y.o. female with a hx of coronary calcifications on CT, CVA, hypertension, CKD, former tobacco use who is being seen for the evaluation of chest pain   Assessment & Plan  Chest pain: She is reporting 2 types of chest pain.  First pain has been constant discomfort that has lasted all day, and in setting of negative troponins x 2, suspect noncardiac chest pain.  However she is also reporting that she is having exertional chest pain that sounds like typical angina, as she describes exertional chest pressure that resolves  with rest.  States that this has been worsening recently, as less exertion is causing her to have chest pain.  She has ischemic EKG changes, as EKG today shows aVR elevation with diffuse ST depressions and T wave inversions in V4-6, new from prior EKG 04/2023.  Echocardiogram shows EF 60 to 65%.  Recent CT chest with severe multivessel coronary calcifications.  - Given EKG changes and worsening exertional angina, treating for unstable angina.  Continue heparin  drip.  Continue ASA.  Can hold plavix  for now in case severe multivessel disease on cath - Plan for [facility] today.  Risks and benefits of cardiac catheterization have been discussed with the patient.  These include bleeding, infection, kidney damage, stroke, heart attack, death.  The patient understands these risks and is willing to proceed. - She has contrast allergy listed but appears no issues with CTA chest in 08/2022.  She was premedicated for this with prednisone  and Benadryl .  Premedicating for cath - Continue rosuvastatin  5 mg daily.  Also on inclisiran.  LDL 32   History of CVA: Continue ASA, statin   Hypertension: On amlodipine  5 mg daily  For questions or updates, please contact [facility] Please consult [email or web address] for contact info under     Signed, Wendie Hamburg, MD  08/25/2023, 9:18 AM

## 2023-08-25 NOTE — Assessment & Plan Note (Signed)
 Patient remained stable overnight last night.  Plan for [facility] with cardiology today.  Follow-up cardiology recommendations after procedure. - Restart heart healthy diet this afternoon - PT/OT to treat - Heparin  gtt for ACS/NSTEMI - s/p ASA 324 mg and continue topical nitroglycerin  -Cardiology recommendations -Continue home amlodipine  -Restart home losartan  following LHC -Monitor electrolytes and hemoglobin

## 2023-08-25 NOTE — Plan of Care (Signed)

## 2023-08-25 NOTE — Progress Notes (Signed)
     Daily Progress Note Intern Pager: [phone]  Patient name: [name] Medical record number: [id number] Date of birth: [birth year] Age: 73 y.o. Gender: female  Primary Care Provider: Glenn Lange, DO Consultants: Cardiology Code Status: DNR limited  Pt Overview and Major Events to Date:  6/16: Admitted 6/17: Left heart cath plan  Assessment and Plan:  This is a 73 year old female patient presenting with progressive dyspnea on exertion and signs of unstable angina to have ischemic changes on EKG.  She has a PMH which includes history of TIA, hypertension, HLD, PE and contrast allergy. Assessment & Plan Unstable angina [facility]) Patient remained stable overnight last night.  Plan for [facility] with cardiology today.  Follow-up cardiology recommendations after procedure. - Restart heart healthy diet this afternoon - PT/OT to treat - Heparin  gtt for ACS/NSTEMI - s/p ASA 324 mg and continue topical nitroglycerin  -Cardiology recommendations -Continue home amlodipine  -Restart home losartan  following LHC -Monitor electrolytes and hemoglobin Hypokalemia Normokalemic this a.m. continue to monitor  FEN/GI: N.p.o. pending procedure PPx: Heparin  Dispo:Pending PT recommendations  pending clinical improvement .   Subjective:  Patient reports improvement in pain with treatment.  All questions answered, no concerns.  Objective: Temp:  [97.5 F (36.4 C)-98.3 F (36.8 C)] 98 F (36.7 C) (06/17 0731) Pulse Rate:  [63-78] 66 (06/17 0731) Resp:  [16-21] 16 (06/17 0731) BP: (118-148)/(63-80) 132/66 (06/17 0731) SpO2:  [90 %-99 %] 98 % (06/17 0731) Weight:  [77.3 kg-78.9 kg] 77.3 kg (06/17 0500) Physical Exam: General: Well-appearing, no distress Cardiovascular: RRR, no M/R/G Respiratory: CTAB, no increased work of breathing Extremities: 2+ pulses no evidence of peripheral edema.  Laboratory: Most recent CBC Lab Results  Component Value Date   WBC 5.7 08/25/2023   HGB 14.3  08/25/2023   HCT 44.2 08/25/2023   MCV 89.3 08/25/2023   PLT 198 08/25/2023   Most recent BMP    Latest Ref Rng & Units 08/25/2023    3:39 AM  BMP  Glucose 70 - 99 mg/dL 295   BUN 8 - 23 mg/dL 15   Creatinine 2.84 - 1.00 mg/dL 1.32   Sodium 440 - 102 mmol/L 141   Potassium 3.5 - 5.1 mmol/L 4.1   Chloride 98 - 111 mmol/L 107   CO2 22 - 32 mmol/L 23   Calcium  8.9 - 10.3 mg/dL 9.0     Rayma Calandra, DO 08/25/2023, 7:56 AM  PGY-1, [facility] FPTS Intern pager: [phone], text pages welcome Secure chat group [facility] Teaching Service

## 2023-08-25 NOTE — Progress Notes (Signed)
 PHARMACY - ANTICOAGULATION CONSULT NOTE  Pharmacy Consult for heparin  Indication: chest pain/ACS  Allergies  Allergen Reactions   Iodinated Contrast Media Shortness Of Breath and Itching    States she had this reaction connected with a MRI of the cervical spine.   Shellfish Allergy Anaphylaxis    All seafood   Crestor  [Rosuvastatin ] Other (See Comments)    Anxiety, itching, headaches, myalgias Able to tolerate 5mg  daily   Lipitor [Atorvastatin ] Other (See Comments)    Anxiety, itching, headaches, myalgias   Statins Other (See Comments)    Arthralgias (severe) with atorvastatin , mild arthralgias with rosuvastatin  but willing to continue taking it   Iodine     Patient Measurements: Height: 5' 6 (167.6 cm) Weight: 78.9 kg (174 lb) IBW/kg (Calculated) : 59.3 HEPARIN  DW (KG): 75.6  Vital Signs: Temp: 97.8 F (36.6 C) (06/17 0403) Temp Source: Oral (06/17 0403) BP: 132/63 (06/17 0403) Pulse Rate: 67 (06/17 0403)  Labs: Recent Labs    08/24/23 1144 08/24/23 1344 08/25/23 0339  HGB 14.0  --  14.3  HCT 43.5  --  44.2  PLT 234  --  198  HEPARINUNFRC  --   --  0.49  CREATININE 1.05*  --  1.20*  TROPONINIHS 9 9  --     Estimated Creatinine Clearance: 45.5 mL/min (A) (by C-G formula based on SCr of 1.2 mg/dL (H)).   Medical History: Past Medical History:  Diagnosis Date   Anemia    Cervical neuritis    Chronic kidney disease    stage 3   Dyslipidemia    GERD (gastroesophageal reflux disease)    History of transient ischemic attack (TIA)    HTN (hypertension)    Hyperlipidemia    Migraine    Migraine headache    Occipital neuralgia    Left   Pulmonary embolism (HCC) 2017   Renal insufficiency    Stroke (HCC) 10/2013     Assessment: 72 YOF presenting with CP, EKG changes, she is not on anticoagulation PTA, CBC wnl  6/17 AM update:  Heparin  level therapeutic   Goal of Therapy:  Heparin  level 0.3-0.7 units/ml Monitor platelets by anticoagulation  protocol: Yes   Plan:  Cont heparin  1000 units/hr Heparin  level in 8 hours  Silvestre Drum, PharmD, BCPS Clinical Pharmacist Phone: [phone]

## 2023-08-25 NOTE — Evaluation (Signed)
 Occupational Therapy Evaluation Patient Details Name: [name] MRN: [id number] DOB: 1951-01-28 Today's Date: 08/25/2023   History of Present Illness   73 yr old female presenting 08/24/23 with chest pain on exertion x several weeks. PMH includes: CHF, anxiety, UTI,  insomina, OA, HTN, TIA, CVA, severe multivessel coronary calcifications on CT chest     Clinical Impressions The pt performed all assessed tasks independently, including supine to sit, doffing and donning her socks seated EOB, sit to stand, toileting at bathroom level, and hand washing in standing at the sink. She is at her baseline level of functioning for self-care management and she is not presenting with functional deficits that would warrant the need for further OT services. OT will sign off.      If plan is discharge home, recommend the following:   Other (comment) (N/A)     Functional Status Assessment   Patient has not had a recent decline in their functional status     Equipment Recommendations   None recommended by OT       Mobility Bed Mobility Overal bed mobility: Independent                  Transfers Overall transfer level: Independent Equipment used: None Transfers: Sit to/from Stand                    Balance Overall balance assessment: Independent           ADL either performed or assessed with clinical judgement   ADL Overall ADL's : Independent;At baseline            Vision   Additional Comments: She correctly read the time depicted on the wall clock.            Pertinent Vitals/Pain Pain Assessment Pain Assessment: 0-10 Pain Score: 4  Pain Location: left chest/shoulder Pain Intervention(s): Monitored during session     Extremity/Trunk Assessment Upper Extremity Assessment Upper Extremity Assessment: Overall WFL for tasks assessed;Right hand dominant   Lower Extremity Assessment Lower Extremity Assessment: Overall WFL for tasks  assessed      Communication Communication Communication: No apparent difficulties   Cognition Arousal: Alert Behavior During Therapy: WFL for tasks assessed/performed               OT - Cognition Comments: Oriented x4                 Following commands: Intact                  Home Living Family/patient expects to be discharged to:: Private residence Living Arrangements: Alone Available Help at Discharge: Family Type of Home: House Home Access: Stairs to enter Secretary/administrator of Steps: 3 Entrance Stairs-Rails: Right Home Layout: One level     Bathroom Shower/Tub: Chief Strategy Officer: Handicapped height Bathroom Accessibility: Yes   Home Equipment: Agricultural consultant (2 wheels);Shower seat;Grab bars - tub/shower;Toilet riser          Prior Functioning/Environment Prior Level of Function : Independent/Modified Independent;Driving             Mobility Comments:  (Independent with ambulation.) ADLs Comments: Independent with ADLs, cooking, cleaning, and driving.    OT Problem List: Pain   OT Treatment/Interventions:   N/A     OT Goals(Current goals can be found in the care plan section)   Acute Rehab OT Goals OT Goal Formulation: All assessment and education complete, DC therapy   OT  Frequency:   N/A       AM-PAC OT 6 Clicks Daily Activity     Outcome Measure Help from another person eating meals?: None Help from another person taking care of personal grooming?: None Help from another person toileting, which includes using toliet, bedpan, or urinal?: None Help from another person bathing (including washing, rinsing, drying)?: None Help from another person to put on and taking off regular upper body clothing?: None Help from another person to put on and taking off regular lower body clothing?: None 6 Click Score: 24   End of Session Equipment Utilized During Treatment: Other (comment) (N/A) Nurse Communication:  Other (comment)  Activity Tolerance: Patient tolerated treatment well Patient left: in bed;with call bell/phone within reach;with family/visitor present  OT Visit Diagnosis: Pain Pain - part of body:  (chest)                Time: 1610-9604 OT Time Calculation (min): 11 min Charges:  OT General Charges $OT Visit: 1 Visit OT Evaluation $OT Eval Low Complexity: 1 Low    Vadhir Mcnay L Reed Dady, OTR/L 08/25/2023, 12:45 PM

## 2023-08-25 NOTE — Progress Notes (Signed)
 Bleeding noted in the TR band after deflating 7 cc of air in total ,inflated back with 7 cc of air,rt radial site soft,no numbness or pain in the site,on call PA Jerilynn Montenegro notified and advised to start deflating  after 30 minutes

## 2023-08-25 NOTE — Assessment & Plan Note (Signed)
 Intermittent chest and left arm pain with exertion and at rest, concerning for unstable angina and potential myocardial infarction. Advised emergency department visit due to severity and risk. Advised transportation via EMS for safety however patient refused stating she is currently asymptomatic and feeling okay and can drive herself. Of note, patient is currently being evaluated for worsening heart failure, had echo this morning which is pending and has an appointment scheduled with cardiology for 08/28/2023. - Send to emergency department for evaluation of chest pain and possible myocardial infarction. - Patient advised to return for hospital follow-up visit whether or not she is admitted

## 2023-08-25 NOTE — Progress Notes (Signed)
 PHARMACY - ANTICOAGULATION CONSULT NOTE  Pharmacy Consult for heparin  Indication: chest pain/ACS  Allergies  Allergen Reactions   Iodinated Contrast Media Shortness Of Breath and Itching    States she had this reaction connected with a MRI of the cervical spine.   Shellfish Allergy Anaphylaxis    All seafood   Crestor  [Rosuvastatin ] Other (See Comments)    Anxiety, itching, headaches, myalgias Able to tolerate 5mg  daily   Lipitor [Atorvastatin ] Other (See Comments)    Anxiety, itching, headaches, myalgias   Statins Other (See Comments)    Arthralgias (severe) with atorvastatin , mild arthralgias with rosuvastatin  but willing to continue taking it   Iodine     Patient Measurements: Height: 5' 6 (167.6 cm) Weight: 77.3 kg (170 lb 6.4 oz) IBW/kg (Calculated) : 59.3 HEPARIN  DW (KG): 75.6  Vital Signs: Temp: 98.1 F (36.7 C) (06/17 1139) Temp Source: Oral (06/17 1139) BP: 154/75 (06/17 1139) Pulse Rate: 74 (06/17 1139)  Labs: Recent Labs    08/24/23 1144 08/24/23 1344 08/25/23 0339 08/25/23 1128  HGB 14.0  --  14.3  --   HCT 43.5  --  44.2  --   PLT 234  --  198  --   HEPARINUNFRC  --   --  0.49 0.44  CREATININE 1.05*  --  1.20*  --   TROPONINIHS 9 9  --   --     Estimated Creatinine Clearance: 45.1 mL/min (A) (by C-G formula based on SCr of 1.2 mg/dL (H)).  Assessment: 73 YO female with a hx of coronary calcifications on CT, hx CVA, hypertension, CKD, and former tobacco use who is admitted for the evaluation of chest pain and EKG changes. She is not on anticoagulation PTA. Pharmacy consulted to manage heparin  for ACS.  Heparin  level remains therapeutic. LHC scheduled for today. Will follow-up plans for heparin  after LHC.  Goal of Therapy:  Heparin  level 0.3-0.7 units/ml Monitor platelets by anticoagulation protocol: Yes   Plan:  Continue heparin  infusion at 1000 units/hr Check heparin  level daily while on heparin  Continue to monitor H&H and  platelets  Thank you for allowing pharmacy to be a part of this patient's care.  Claudia Cuff, PharmD, BCPS Clinical Pharmacist

## 2023-08-25 NOTE — Assessment & Plan Note (Signed)
 Normokalemic this a.m. continue to monitor

## 2023-08-26 ENCOUNTER — Encounter: Payer: Self-pay | Admitting: Cardiovascular Disease

## 2023-08-26 ENCOUNTER — Other Ambulatory Visit (HOSPITAL_COMMUNITY): Payer: Self-pay

## 2023-08-26 DIAGNOSIS — I200 Unstable angina: Secondary | ICD-10-CM | POA: Diagnosis not present

## 2023-08-26 DIAGNOSIS — I5032 Chronic diastolic (congestive) heart failure: Secondary | ICD-10-CM

## 2023-08-26 DIAGNOSIS — Z8673 Personal history of transient ischemic attack (TIA), and cerebral infarction without residual deficits: Secondary | ICD-10-CM | POA: Diagnosis not present

## 2023-08-26 LAB — CBC
HCT: 40 % (ref 36.0–46.0)
Hemoglobin: 13 g/dL (ref 12.0–15.0)
MCH: 29.2 pg (ref 26.0–34.0)
MCHC: 32.5 g/dL (ref 30.0–36.0)
MCV: 89.9 fL (ref 80.0–100.0)
Platelets: 215 10*3/uL (ref 150–400)
RBC: 4.45 MIL/uL (ref 3.87–5.11)
RDW: 12.9 % (ref 11.5–15.5)
WBC: 12.9 10*3/uL — ABNORMAL HIGH (ref 4.0–10.5)
nRBC: 0 % (ref 0.0–0.2)

## 2023-08-26 LAB — MAGNESIUM: Magnesium: 2.2 mg/dL (ref 1.7–2.4)

## 2023-08-26 LAB — BASIC METABOLIC PANEL WITH GFR
Anion gap: 8 (ref 5–15)
BUN: 16 mg/dL (ref 8–23)
CO2: 22 mmol/L (ref 22–32)
Calcium: 8.3 mg/dL — ABNORMAL LOW (ref 8.9–10.3)
Chloride: 110 mmol/L (ref 98–111)
Creatinine, Ser: 1.28 mg/dL — ABNORMAL HIGH (ref 0.44–1.00)
GFR, Estimated: 45 mL/min — ABNORMAL LOW (ref >60)
Glucose, Bld: 125 mg/dL — ABNORMAL HIGH (ref 70–99)
Potassium: 3.7 mmol/L (ref 3.5–5.1)
Sodium: 140 mmol/L (ref 135–145)

## 2023-08-26 MED ORDER — POTASSIUM CHLORIDE CRYS ER 20 MEQ PO TBCR
40.0000 meq | EXTENDED_RELEASE_TABLET | Freq: Once | ORAL | Status: AC
  Administered 2023-08-26: 40 meq via ORAL
  Filled 2023-08-26: qty 2

## 2023-08-26 MED ORDER — ASPIRIN 81 MG PO TBEC
81.0000 mg | DELAYED_RELEASE_TABLET | Freq: Every day | ORAL | 1 refills | Status: AC
  Filled 2023-08-26: qty 30, 30d supply, fill #0

## 2023-08-26 MED ORDER — LOSARTAN POTASSIUM 25 MG PO TABS
25.0000 mg | ORAL_TABLET | Freq: Every day | ORAL | Status: DC
  Administered 2023-08-26: 25 mg via ORAL
  Filled 2023-08-26: qty 1

## 2023-08-26 MED ORDER — FUROSEMIDE 40 MG PO TABS
40.0000 mg | ORAL_TABLET | Freq: Every day | ORAL | 2 refills | Status: DC | PRN
  Filled 2023-08-26: qty 30, 30d supply, fill #0

## 2023-08-26 MED FILL — Clopidogrel Bisulfate Tab 300 MG (Base Equiv): ORAL | Qty: 2 | Status: AC

## 2023-08-26 MED FILL — Clopidogrel Bisulfate Tab 75 MG (Base Equiv): ORAL | Qty: 1 | Status: AC

## 2023-08-26 NOTE — Progress Notes (Signed)
   08/26/23 1125  TOC Brief Assessment  Insurance and Status Reviewed  Patient has primary care physician Yes  Home environment has been reviewed home  Prior level of function: self  Prior/Current Home Services No current home services  Social Drivers of Health Review SDOH reviewed no interventions necessary  Readmission risk has been reviewed Yes  Transition of care needs no transition of care needs at this time    Pt s/p heart cath, no HH or DME needs noted. Family to transport home.

## 2023-08-26 NOTE — Discharge Instructions (Addendum)
 Dear [name],  Thank you for letting us  participate in your care. You were hospitalized for chest pain and diagnosed with Unstable angina (HCC). You were treated with a heart catheterization and placement of a stent in one of the arteries to your heart.   You should continue to weigh yourself daily. Please take one Lasix  40mg  per day IF you gain more than 3 pounds in 1 day or more than 5 pounds in 1 week.  POST-HOSPITAL & CARE INSTRUCTIONS If you have new worsening chest pain please return to to be seen. Please take your new medications as directed below.  Please make sure to follow-up with your primary care doctor and your cardiology. Go to your follow up appointments (listed below)   DOCTOR'S APPOINTMENT   Future Appointments  Date Time Provider Department Center  08/28/2023  8:25 AM Clearnce Curia, NP [facility]CVD [facility]  09/02/2023  9:50 AM Glenn Lange, DO [facility]  09/17/2023 10:15 AM Kirsteins, Cecilia Coe, MD CPR-PRMA CPR  01/06/2024  8:30 AM Godwin Lat, Sherida Dimmer, MD GNA-GNA None  02/17/2024  8:15 AM [facility]-CHAIR 1 CH-INFWM None  07/18/2024  9:10 AM [facility]F ANNUAL WELLNESS VISIT [facility]     Take care and be well!  Family Medicine Teaching Service Inpatient Team [place]  [facility]  [address] [place], [place] [address] [phone]

## 2023-08-26 NOTE — Progress Notes (Signed)
 Rounding Note   Patient Name: [name] Date of Encounter: 08/26/2023  [facility] Cardiologist: Maudine Sos, MD   Subjective Reports chest pain improved.  Denies any dyspnea.  BP 150/77, creatinine 1.28, hemoglobin 13  Scheduled Meds:  amLODipine   5 mg Oral Daily   aspirin  EC  81 mg Oral Daily   clopidogrel   75 mg Oral Q breakfast   enoxaparin  (LOVENOX ) injection  40 mg Subcutaneous Q24H   furosemide   40 mg Oral Daily   gabapentin   300 mg Oral TID   losartan   25 mg Oral Daily   pantoprazole   80 mg Oral Daily   rosuvastatin   5 mg Oral Daily   sertraline   100 mg Oral Daily   sodium chloride  flush  3 mL Intravenous Q12H   Continuous Infusions:  sodium chloride      PRN Meds: sodium chloride , acetaminophen  **OR** acetaminophen , sodium chloride  flush   Vital Signs  Vitals:   08/25/23 2000 08/25/23 2340 08/26/23 0300 08/26/23 0724  BP: 119/61 139/74 126/73 (!) 150/77  Pulse: 91 75 69 66  Resp: 20 19 19 12   Temp: 98.3 F (36.8 C) 97.9 F (36.6 C) 97.8 F (36.6 C) 98.2 F (36.8 C)  TempSrc: Oral Oral Oral Oral  SpO2: 96% 97% 95% 97%  Weight:      Height:        Intake/Output Summary (Last 24 hours) at 08/26/2023 0936 Last data filed at 08/26/2023 0304 Gross per 24 hour  Intake 240 ml  Output 700 ml  Net -460 ml      08/25/2023    5:00 AM 08/24/2023   11:41 AM 08/24/2023   10:41 AM  Last 3 Weights  Weight (lbs) 170 lb 6.4 oz 174 lb 174 lb  Weight (kg) 77.293 kg 78.926 kg 78.926 kg      Telemetry NSR - Personally Reviewed  ECG  No new ECG - Personally Reviewed  Physical Exam  GEN: No acute distress.   Neck: No JVD Cardiac: RRR, 2/6 systolic murmur Respiratory: Clear to auscultation bilaterally. GI: Soft, nontender, non-distended  MS: No edema; No deformity. Neuro:  Nonfocal  Psych: Normal affect   Labs High Sensitivity Troponin:   Recent Labs  Lab 08/24/23 1144 08/24/23 1344  TROPONINIHS 9 9     Chemistry Recent Labs   Lab 08/24/23 1144 08/24/23 2138 08/25/23 0339 08/25/23 2050 08/26/23 0305  NA 141  --  141  --  140  K 3.4*  --  4.1  --  3.7  CL 106  --  107  --  110  CO2 26  --  23  --  22  GLUCOSE 103*  --  159*  --  125*  BUN 16  --  15  --  16  CREATININE 1.05*  --  1.20* 1.68* 1.28*  CALCIUM  9.6  --  9.0  --  8.3*  MG  --  2.2  --   --  2.2  GFRNONAA 57*  --  48* 32* 45*  ANIONGAP 9  --  11  --  8    Lipids  Recent Labs  Lab 08/25/23 0339  CHOL 108  TRIG 44  HDL 67  LDLCALC 32  CHOLHDL 1.6    Hematology Recent Labs  Lab 08/25/23 0339 08/25/23 2050 08/26/23 0305  WBC 5.7 13.6* 12.9*  RBC 4.95 4.57 4.45  HGB 14.3 13.4 13.0  HCT 44.2 40.5 40.0  MCV 89.3 88.6 89.9  MCH 28.9 29.3 29.2  MCHC 32.4 33.1 32.5  RDW 13.0 13.0 12.9  PLT 198 221 215   Thyroid  No results for input(s): TSH, FREET4 in the last 168 hours.  BNP Recent Labs  Lab 08/24/23 1347  BNP 76.9    DDimer No results for input(s): DDIMER in the last 168 hours.   Radiology  CARDIAC CATHETERIZATION Result Date: 08/25/2023   Prox LAD to Mid LAD lesion is 85% stenosed.   Prox RCA to Mid RCA lesion is 10% stenosed.   Mid Cx lesion is 15% stenosed.   Mid LAD to Dist LAD lesion is 20% stenosed.   A drug-eluting stent was successfully placed using a STENT SYNERGY XD 3.0X12.   Post intervention, there is a 0% residual stenosis.   LV end diastolic pressure is normal.   Recommend uninterrupted dual antiplatelet therapy with Aspirin  81mg  daily and Clopidogrel  75mg  daily for a minimum of 12 months (ACS-Class I recommendation). 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 RFR 0.62 Normal LVEDP 15 mm Hg Successful PCI of the LAD with DES x 1 Plan: DAPT for one year. Anticipate DC in am   DG Chest 2 View Result Date: 08/24/2023 CLINICAL DATA:  cp EXAM: CHEST - 2 VIEW COMPARISON:  None available. FINDINGS: Mild elevation of the right hemidiaphragm. Nodular opacities in both lung bases on the frontal radiograph,  likely nipple shadows. No focal airspace consolidation, pleural effusion, or pneumothorax. No cardiomegaly. Tortuous aorta with aortic atherosclerosis. No acute fracture or destructive lesions. Multilevel thoracic osteophytosis. Cervical fusion hardware again noted. Healed sternal body fracture. IMPRESSION: No acute cardiopulmonary abnormality. Electronically Signed   By: Rance Burrows M.D.   On: 08/24/2023 12:45   ECHOCARDIOGRAM COMPLETE Result Date: 08/24/2023    ECHOCARDIOGRAM REPORT   Patient Name:   [name] Date of Exam: 08/24/2023 Medical Rec #:  [id number]        Height:       66.0 in Accession #:    [id number]       Weight:       172.8 lb Date of Birth:  Jul 22, 1950        BSA:          1.879 m Patient Age:    73 years         BP:           120/60 mmHg Patient Gender: F                HR:           64 bpm. Exam Location:  Outpatient Procedure: 2D Echo, 3D Echo, Cardiac Doppler, Color Doppler and Strain Analysis            (Both Spectral and Color Flow Doppler were utilized during            procedure). Indications:    CHF  History:        Patient has prior history of Echocardiogram examinations, most                 recent 09/04/2022. CAD, TIA, Arrythmias:Tachycardia,                 Signs/Symptoms:Syncope; Risk Factors:Hypertension, Dyslipidemia                 and Former Smoker. Pulmonary Embolism.  Sonographer:    Gelene Kelly RDCS Referring Phys: [id number] CARINA M BROWN  Sonographer Comments: Patient reports increased shortness of breath since 06/2023. Patient states she is unable to  walk more than 15 minutes without needing to stop and rest. IMPRESSIONS  1. Left ventricular ejection fraction, by estimation, is 60 to 65%. The left ventricle has normal function. The left ventricle has no regional wall motion abnormalities. Left ventricular diastolic parameters are consistent with Grade I diastolic dysfunction (impaired relaxation). The average left ventricular global longitudinal strain is  -16.2 %. The global longitudinal strain is abnormal.  2. Right ventricular systolic function is normal. The right ventricular size is normal. There is normal pulmonary artery systolic pressure. The estimated right ventricular systolic pressure is 20.6 mmHg.  3. The mitral valve is grossly normal. Trivial mitral valve regurgitation.  4. The aortic valve is tricuspid. Aortic valve regurgitation is mild. Aortic valve sclerosis/calcification is present, without any evidence of aortic stenosis.  5. The inferior vena cava is normal in size with greater than 50% respiratory variability, suggesting right atrial pressure of 3 mmHg. Comparison(s): No significant change from prior study. 09/04/2022: LVEF 60-65%. FINDINGS  Left Ventricle: Left ventricular ejection fraction, by estimation, is 60 to 65%. The left ventricle has normal function. The left ventricle has no regional wall motion abnormalities. The average left ventricular global longitudinal strain is -16.2 %. Strain was performed and the global longitudinal strain is abnormal. 3D ejection fraction reviewed and evaluated as part of the interpretation. Alternate measurement of EF is felt to be most reflective of LV function. The left ventricular internal cavity  size was normal in size. There is no left ventricular hypertrophy. Left ventricular diastolic parameters are consistent with Grade I diastolic dysfunction (impaired relaxation). Indeterminate filling pressures. Right Ventricle: The right ventricular size is normal. No increase in right ventricular wall thickness. Right ventricular systolic function is normal. There is normal pulmonary artery systolic pressure. The tricuspid regurgitant velocity is 2.10 m/s, and  with an assumed right atrial pressure of 3 mmHg, the estimated right ventricular systolic pressure is 20.6 mmHg. Left Atrium: Left atrial size was normal in size. Right Atrium: Right atrial size was normal in size. Pericardium: There is no evidence of  pericardial effusion. Mitral Valve: The mitral valve is grossly normal. Trivial mitral valve regurgitation. Tricuspid Valve: The tricuspid valve is grossly normal. Tricuspid valve regurgitation is trivial. Aortic Valve: The aortic valve is tricuspid. Aortic valve regurgitation is mild. Aortic regurgitation PHT measures 514 msec. Aortic valve sclerosis/calcification is present, without any evidence of aortic stenosis. Aortic valve mean gradient measures 9.0  mmHg. Aortic valve peak gradient measures 17.2 mmHg. Aortic valve area, by VTI measures 1.57 cm. Pulmonic Valve: The pulmonic valve was not well visualized. Pulmonic valve regurgitation is not visualized. Aorta: The aortic root and ascending aorta are structurally normal, with no evidence of dilitation. Venous: The inferior vena cava is normal in size with greater than 50% respiratory variability, suggesting right atrial pressure of 3 mmHg. IAS/Shunts: No atrial level shunt detected by color flow Doppler. Additional Comments: 3D was performed not requiring image post processing on an independent workstation and was indeterminate.  LEFT VENTRICLE PLAX 2D LVIDd:         4.78 cm   Diastology LVIDs:         2.59 cm   LV e' medial:    6.53 cm/s LV PW:         0.74 cm   LV E/e' medial:  11.4 LV IVS:        0.71 cm   LV e' lateral:   6.96 cm/s LVOT diam:     1.90 cm  LV E/e' lateral: 10.7 LV SV:         68 LV SV Index:   36        2D Longitudinal Strain LVOT Area:     2.84 cm  2D Strain GLS (A4C):   -18.1 %                          2D Strain GLS (A3C):   -15.6 %                          2D Strain GLS (A2C):   -14.9 %                          2D Strain GLS Avg:     -16.2 %                           3D Volume EF:                          3D EF:        50 %                          LV EDV:       107 ml                          LV ESV:       53 ml                          LV SV:        54 ml RIGHT VENTRICLE RV Basal diam:  2.90 cm RV Mid diam:    2.77 cm RV S prime:      12.30 cm/s TAPSE (M-mode): 1.9 cm LEFT ATRIUM             Index        RIGHT ATRIUM           Index LA diam:        3.40 cm 1.81 cm/m   RA Area:     11.80 cm LA Vol (A2C):   37.4 ml 19.90 ml/m  RA Volume:   24.70 ml  13.14 ml/m LA Vol (A4C):   45.0 ml 23.94 ml/m LA Biplane Vol: 44.4 ml 23.63 ml/m  AORTIC VALVE AV Area (Vmax):    1.60 cm AV Area (Vmean):   1.57 cm AV Area (VTI):     1.57 cm AV Vmax:           207.50 cm/s AV Vmean:          134.500 cm/s AV VTI:            0.432 m AV Peak Grad:      17.2 mmHg AV Mean Grad:      9.0 mmHg LVOT Vmax:         117.00 cm/s LVOT Vmean:        74.400 cm/s LVOT VTI:          0.240 m LVOT/AV VTI ratio: 0.55 AI PHT:            514 msec AR Vena Contracta: 0.27 cm  AORTA Ao Root diam: 2.60 cm Ao  Asc diam:  3.20 cm MITRAL VALVE               TRICUSPID VALVE MV Area (PHT): 2.99 cm    TR Peak grad:   17.6 mmHg MV Decel Time: 254 msec    TR Vmax:        210.00 cm/s MV E velocity: 74.40 cm/s MV A velocity: 82.80 cm/s  SHUNTS MV E/A ratio:  0.90        Systemic VTI:  0.24 m                            Systemic Diam: 1.90 cm Dinah Franco MD Electronically signed by Dinah Franco MD Signature Date/Time: 08/24/2023/11:05:58 AM    Final     Cardiac Studies   Patient Profile   73 y.o. female with a hx of coronary calcifications on CT, CVA, hypertension, CKD, former tobacco use who is being seen for the evaluation of chest pain   Assessment & Plan  Unstable angina: She is reporting 2 types of chest pain.  First pain has been constant discomfort that has lasted all day when she presented, and in setting of negative troponins x 2, suspect noncardiac chest pain.  However she is also reporting that she is having exertional chest pain that sounds like typical angina, as she describes exertional chest pressure that resolves with rest.  States that this has been worsening recently, as less exertion is causing her to have chest pain.  She has ischemic EKG changes, as EKG on  admission shows aVR elevation with diffuse ST depressions and T wave inversions in V4-6, new from prior EKG 04/2023.  Echocardiogram shows EF 60 to 65%.  Recent CT chest with severe multivessel coronary calcifications.  Given EKG changes and worsening exertional angina, treated for unstable angina.  LHC on 6/17 showed 85% stenosis in proximal to mid LAD, RFR 0.62, status post successful PCI with DES x 1.  Also had 10% proximal to mid RCA stenosis, 50% mid LCx, 20% mid to distal LAD. - Continue aspirin  81 mg daily, Plavix  75 mg daily - Continue rosuvastatin  5 mg daily.  Also on inclisiran.  LDL 32   History of CVA: Continue plavix , statin   Hypertension: On amlodipine  5 mg daily and losartan  25 mg daily  CKD stage IIIa: Creatinine stable at 1.28 this morning  Chronic diastolic heart failure: Takes as needed Lasix  for lower extremity edema  [facility] will sign off.   Medication Recommendations: Aspirin  81 mg daily, Plavix  75 mg daily, rosuvastatin  5 mg daily, losartan  25 mg daily, amlodipine  5 mg daily.  She will continue inclisiran every 6 months.  Continue Lasix  40 mg daily as needed if gains more than 3 pounds in 1 day or 5 pounds in 1 week Other recommendations (labs, testing, etc): BMET in 1 week Follow up as an outpatient: Has follow-up scheduled on 6/20   For questions or updates, please contact [facility] Please consult [email or web address] for contact info under     Signed, Wendie Hamburg, MD  08/26/2023, 9:36 AM

## 2023-08-26 NOTE — Assessment & Plan Note (Deleted)
 Hx of TIA and CVA: Continue home Plavix   HTN: Continue home Amlodipine  5 mg daily HLD: Continue home Rosuvastatin  5 mg daily. Leqvio  last dose in May, good for 5 months. Contrast allergy: Tolerates contrast once premedicated with Prednisone  and Benadryl , which they plan to use prior to cath. PE in 2017

## 2023-08-26 NOTE — Assessment & Plan Note (Deleted)
 Normokalemic this a.m. continue to monitor

## 2023-08-26 NOTE — Discharge Summary (Signed)
 Family Medicine Teaching [facility] Discharge Summary  Patient name: [name] Medical record number: [id number] Date of birth: [birth year] Age: 73 y.o. Gender: female Date of Admission: 08/24/2023  Date of Discharge: 08/26/23 Admitting Physician: Clyda Dark, DO  Primary Care Provider: Glenn Lange, DO Consultants: Cardiology  Indication for Hospitalization: Dyspnea on exertion  Discharge Diagnoses/Problem List:  Principal Problem for Admission: Unstable angina Other Problems addressed during stay:  Principal Problem:   Unstable angina [facility]) Active Problems:   History of CVA (cerebrovascular accident)   Chronic diastolic heart failure (HCC)   Chronic health problem   Abnormal EKG    Brief Hospital Course:  [name] is a 73 y.o.female with a history of TIA/CVA, diastolic heart failure, HTN, CAD, and HLD who was admitted to the [facility] Medicine teaching Service at [facility] for unstable angina. Her hospital course is detailed below:  Unstable Angina Patient presented with worsening symptoms of chest pressure/pain especially with exertion and her left upper chest.  Symptoms have become progressively more persistent and occur with minimal activities.  Outpatient Echo performed prior to admission revealed EF 60 to 65%, G1 DD, normal RV function, and no significant valvular disease.  However EKG demonstrated aVR elevation and new T wave inversions in leads V4-6, and diffuse ST depressions; new from prior EKG in February 2025.  Troponins trended flat and BMP within normal limits at admission.  Cardiology consulted in the ED and discussed performing a left heart cath with the patient, who is agreeable.  LHC on 6/17 revealed 85% stenosis of proximal LAD.  Drug-eluting stent was successfully placed and patient stable for discharge on postop day 1.  She will continue DAPT therapy for 12 months.  Other chronic conditions were medically managed with home medications and formulary  alternatives as necessary (TIA/CVA, HTN, HLD, Contrast allergy)  PCP Follow-up Recommendations: Continue DAPT therapy for 1 year Repeat BMP for potassium monitoring Consider SGLT2i Consider different abortive medication for migraines if HTN is uncontrolled  Disposition: Home  Discharge Condition: Stable  Discharge Exam:  Vitals:   08/26/23 0300 08/26/23 0724  BP: 126/73 (!) 150/77  Pulse: 69 66  Resp: 19 12  Temp: 97.8 F (36.6 C) 98.2 F (36.8 C)  SpO2: 95% 97%   General: A&O, NAD Cardiac: RRR, no m/r/g Respiratory: CTAB, normal WOB, no w/c/r GI: Soft, NTTP, non-distended  Extremities: NTTP, no peripheral edema.  Significant Procedures:   Left heart catheterization-85% stenosis of proximal LAD  Significant Labs and Imaging:  Recent Labs  Lab 08/25/23 0339 08/25/23 2050 08/26/23 0305  WBC 5.7 13.6* 12.9*  HGB 14.3 13.4 13.0  HCT 44.2 40.5 40.0  PLT 198 221 215   Recent Labs  Lab 08/24/23 1144 08/24/23 2138 08/25/23 0339 08/25/23 2050 08/26/23 0305  NA 141  --  141  --  140  K 3.4*  --  4.1  --  3.7  CL 106  --  107  --  110  CO2 26  --  23  --  22  GLUCOSE 103*  --  159*  --  125*  BUN 16  --  15  --  16  CREATININE 1.05*  --  1.20* 1.68* 1.28*  CALCIUM  9.6  --  9.0  --  8.3*  MG  --  2.2  --   --  2.2    Results/Tests Pending at Time of Discharge: None  Discharge Medications:  Allergies as of 08/26/2023       Reactions  Iodinated Contrast Media Shortness Of Breath, Itching   States she had this reaction connected with a MRI of the cervical spine.   Shellfish Allergy Anaphylaxis   All seafood   Crestor  [rosuvastatin ] Other (See Comments)   Anxiety, itching, headaches, myalgias Able to tolerate 5mg  daily   Lipitor [atorvastatin ] Other (See Comments)   Anxiety, itching, headaches, myalgias   Statins Other (See Comments)   Arthralgias (severe) with atorvastatin , mild arthralgias with rosuvastatin  but willing to continue taking it   Iodine          Medication List     STOP taking these medications    doxepin  100 MG capsule Commonly known as: SINEQUAN        TAKE these medications    albuterol  108 (90 Base) MCG/ACT inhaler Commonly known as: VENTOLIN  HFA Inhale 2 puffs into the lungs every 6 (six) hours as needed for wheezing or shortness of breath.   amLODipine  5 MG tablet Commonly known as: NORVASC  TAKE 1 TABLET (5 MG TOTAL) BY MOUTH DAILY.   aspirin  EC 81 MG tablet Take 1 tablet (81 mg total) by mouth daily. Swallow whole.   BIOTIN PO Take 1 capsule by mouth daily. Dosage unknown   CALCIUM  PO Take 1 tablet by mouth daily. Dosage unknown   cetirizine  10 MG tablet Commonly known as: ZYRTEC  Take 1 tablet (10 mg total) by mouth daily with breakfast.   CHOLECALCIFEROL PO Take 1 tablet by mouth daily. Dosage unknown   clopidogrel  75 MG tablet Commonly known as: PLAVIX  TAKE 1 TABLET BY MOUTH EVERY DAY   fluticasone  50 MCG/ACT nasal spray Commonly known as: FLONASE  INSTILL 1 SPRAY INTO BOTH NOSTRILS DAILY   furosemide  40 MG tablet Commonly known as: LASIX  Take 1 tablet (40 mg total) by mouth daily as needed. For weight gain greater than 3lbs in one day or 5lbs in a week. What changed:  when to take this reasons to take this additional instructions   gabapentin  600 MG tablet Commonly known as: NEURONTIN  TAKE 1 TABLET BY MOUTH THREE TIMES A DAY What changed:  when to take this reasons to take this   Leqvio  284 MG/1.5ML Sosy injection Generic drug: inclisiran Inject as directed What changed:  how much to take how to take this additional instructions   losartan  25 MG tablet Commonly known as: COZAAR  TAKE 1 TABLET BY MOUTH EVERYDAY AT BEDTIME   Nurtec 75 MG Tbdp Generic drug: Rimegepant Sulfate Take 1 tablet (75 mg total) by mouth daily as needed (take for abortive therapy of migraine, no more than 1 tablet in 24 hours or 10 per month).   ondansetron  4 MG tablet Commonly known as:  Zofran  Take 1 tablet (4 mg total) by mouth every 8 (eight) hours as needed for nausea or vomiting.   pantoprazole  40 MG tablet Commonly known as: PROTONIX  TAKE 1 TABLET BY MOUTH TWICE A DAY What changed:  how much to take when to take this   rosuvastatin  5 MG tablet Commonly known as: CRESTOR  TAKE 1 TABLET (5 MG TOTAL) BY MOUTH DAILY.   sertraline  100 MG tablet Commonly known as: ZOLOFT  TAKE 1 TABLET BY MOUTH EVERY DAY   tiZANidine  4 MG tablet Commonly known as: ZANAFLEX  Take 1 tablet (4 mg total) by mouth every 6 (six) hours as needed for muscle spasms.   traMADol  50 MG tablet Commonly known as: ULTRAM  TAKE 1 TABLET EVERY 8 HOURS AS NEEDED FOR MODERATE PAIN   traZODone  50 MG tablet Commonly known as: DESYREL   TAKE 1/2 TO 1 TABLET BY MOUTH AT BEDTIME AS NEEDED FOR SLEEP   zonisamide  100 MG capsule Commonly known as: ZONEGRAN  Take 1 capsule (100 mg total) by mouth 2 (two) times daily. What changed: Another medication with the same name was removed. Continue taking this medication, and follow the directions you see here.        Discharge Instructions: Please refer to Patient Instructions section of EMR for full details.  Patient was counseled important signs and symptoms that should prompt return to medical care, changes in medications, dietary instructions, activity restrictions, and follow up appointments.   Follow-Up Appointments:  Follow-up Information     Glenn Lange, DO Follow up.   Specialty: Family Medicine Why: Monday 6/23 @ 3:10pm Contact information: [address] [place] [address] [phone]                 Rayma Calandra, DO 08/26/2023, 11:09 AM PGY-1, [facility]

## 2023-08-26 NOTE — Progress Notes (Signed)
 CARDIAC REHAB PHASE I   PRE:  Rate/Rhythm: 79 SR   BP:  Sitting: 141/75      SaO2: 96 RA   MODE:  Ambulation: 470 ft   POST:  Rate/Rhythm: 104 ST   BP:  Sitting: 176/83      SaO2: 97 RA   Pt ambulated independently in hallway. Tolerated well with no CP, SOB or dizziness. Post BP elevated, RN made aware.  Post stent education including restrictions, risk factors, exercise guidelines, antiplatelet therapy importance, NTG use, heart healthy diet and CRP2 reviewed. All questions and concerns addressed. Will refer to [facility] for CRP2. Plan for discharge home today.   [id number] Ronny Colas, RN BSN 08/26/2023 10:15 AM

## 2023-08-26 NOTE — Plan of Care (Signed)

## 2023-08-26 NOTE — Plan of Care (Signed)
   Problem: Education: Goal: Knowledge of General Education information will improve Description: Including pain rating scale, medication(s)/side effects and non-pharmacologic comfort measures Outcome: Adequate for Discharge   Problem: Health Behavior/Discharge Planning: Goal: Ability to manage health-related needs will improve Outcome: Adequate for Discharge   Problem: Clinical Measurements: Goal: Ability to maintain clinical measurements within normal limits will improve Outcome: Adequate for Discharge Goal: Will remain free from infection Outcome: Adequate for Discharge Goal: Diagnostic test results will improve Outcome: Adequate for Discharge Goal: Respiratory complications will improve Outcome: Adequate for Discharge Goal: Cardiovascular complication will be avoided Outcome: Adequate for Discharge   Problem: Activity: Goal: Risk for activity intolerance will decrease Outcome: Adequate for Discharge   Problem: Nutrition: Goal: Adequate nutrition will be maintained Outcome: Adequate for Discharge   Problem: Coping: Goal: Level of anxiety will decrease Outcome: Adequate for Discharge   Problem: Elimination: Goal: Will not experience complications related to bowel motility Outcome: Adequate for Discharge Goal: Will not experience complications related to urinary retention Outcome: Adequate for Discharge   Problem: Pain Managment: Goal: General experience of comfort will improve and/or be controlled Outcome: Adequate for Discharge   Problem: Safety: Goal: Ability to remain free from injury will improve Outcome: Adequate for Discharge   Problem: Skin Integrity: Goal: Risk for impaired skin integrity will decrease Outcome: Adequate for Discharge   Problem: Education: Goal: Understanding of CV disease, CV risk reduction, and recovery process will improve Outcome: Adequate for Discharge Goal: Individualized Educational Video(s) Outcome: Adequate for Discharge    Problem: Activity: Goal: Ability to return to baseline activity level will improve Outcome: Adequate for Discharge   Problem: Cardiovascular: Goal: Ability to achieve and maintain adequate cardiovascular perfusion will improve Outcome: Adequate for Discharge Goal: Vascular access site(s) Level 0-1 will be maintained Outcome: Adequate for Discharge   Problem: Health Behavior/Discharge Planning: Goal: Ability to safely manage health-related needs after discharge will improve Outcome: Adequate for Discharge

## 2023-08-26 NOTE — Assessment & Plan Note (Deleted)
 LHC completed which showed 85% stenosis of proximal LAD to mid LAD is successfully stented using drug-eluting stent.  Patient will need 12 months of DAPT therapy following placement. - PT/OT to treat -Begin aspirin  81 mg and Plavix  75 mg -Changed to Lovenox  for DVT prophylaxis -Cardiology recommendations -Continue home amlodipine  - resume home losartan  25 mg -Monitor electrolytes and hemoglobin - K >4, mag >2, replete as needed.

## 2023-08-28 ENCOUNTER — Telehealth (HOSPITAL_COMMUNITY): Payer: Self-pay

## 2023-08-28 ENCOUNTER — Encounter (HOSPITAL_BASED_OUTPATIENT_CLINIC_OR_DEPARTMENT_OTHER): Payer: Self-pay | Admitting: Family

## 2023-08-28 ENCOUNTER — Ambulatory Visit (HOSPITAL_BASED_OUTPATIENT_CLINIC_OR_DEPARTMENT_OTHER): Admitting: Family

## 2023-08-28 VITALS — BP 92/60 | HR 81 | Ht 66.0 in | Wt 175.0 lb

## 2023-08-28 DIAGNOSIS — I5032 Chronic diastolic (congestive) heart failure: Secondary | ICD-10-CM

## 2023-08-28 DIAGNOSIS — I25118 Atherosclerotic heart disease of native coronary artery with other forms of angina pectoris: Secondary | ICD-10-CM | POA: Diagnosis not present

## 2023-08-28 DIAGNOSIS — E785 Hyperlipidemia, unspecified: Secondary | ICD-10-CM

## 2023-08-28 DIAGNOSIS — I10 Essential (primary) hypertension: Secondary | ICD-10-CM

## 2023-08-28 DIAGNOSIS — Z9889 Other specified postprocedural states: Secondary | ICD-10-CM

## 2023-08-28 LAB — LIPOPROTEIN A (LPA): Lipoprotein (a): <8.4 nmol/L (ref <75.0)

## 2023-08-28 NOTE — Telephone Encounter (Signed)
 Attempted to call patient in regards to Cardiac Rehab - LM on VM

## 2023-08-28 NOTE — Progress Notes (Signed)
 Cardiology Office Note   Date:  08/28/2023  ID:  [name], [name] 1950-03-14, MRN [id number] PCP: Glenn Lange, DO  [facility] Providers Cardiologist:  Maudine Sos, MD     History of Present Illness [name] is a 73 y.o. female  with a hx of HTN, CAD s/p DES-LAD 08/25/2023, HLD (intolerant to Atorvastatin , Praluent ), CKD III, CVA, left subclavian artery stenosis, prior tobacco use, prior DVT 2019 treated with Eliquis  for 6 months.   Seen 08/05/22 with 3 episodes of syncope without prodromal symptoms.  She was not orthostatic.  30-day heart monitor, CT neck, chest as well as echocardiogram were recommended. Echo 09/04/22 LVEF 60 to 65%, mild LVH, grade 1 diastolic dysfunction, mild to moderate aortic regurgitation.  CT chest 08/11/22 no PE, known compression fracture of thoracic spine, coronary calcification, aortic atherosclerosis. 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 She did not complete monitor and as no recurrent symptoms, it was deferred.    Seen 05/08/2023 doing overall well from a cardiac perspective.  Zetia  stopped as lipid controlled with Crestor , Incliseron.    Admitted 6/15-6/18//25 after presenting with chest pain and unstable angina.  EKG aVR elevation and new T wave inversion in leads V4 through V6 and diffuse ST depressions.  LHC 08/25/2583% stenosis of proximal LAD treated with DES recommended for DAPT for 12 months. History of Present Illness [name] is a 73 year old female with coronary artery disease who presents for follow-up after a recent cardiac stent placement. She is accompanied by her grandson.  She experiences soreness in her right wrist and thumb following the procedure, which is improving. Chest tightness occurs, particularly with movement, lasting short time but improves on its own. Breathing has improved significantly since the  procedure. No palpitations are present.  Ankle swelling noted in the hospital has improved. She takes her diuretic every other day as needed to manage fluid retention, using tingling in her fingers as an indicator.  She is on aspirin  and clopidogrel  (Plavix ) to protect the stent. Glucose levels were elevated in the hospital, but previous A1c levels have been normal.  ROS: Please see the history of present illness.    All other systems reviewed and are negative.   Studies Reviewed EKG Interpretation Date/Time:  Friday August 28 2023 09:13:04 EDT Ventricular Rate:  65 PR Interval:  136 QRS Duration:  92 QT Interval:  442 QTC Calculation: 459 R Axis:   80  Text Interpretation: Normal sinus rhythm TWI in lateral leads less severe compared to prior. Expected changes post PCI. Confirmed by Neomi Banks ([id number]) on 08/28/2023 7:39:40 PM    Cardiac Studies & Procedures   ______________________________________________________________________________________________ CARDIAC CATHETERIZATION  CARDIAC CATHETERIZATION 08/25/2023  Conclusion   Prox LAD to Mid LAD lesion is 85% stenosed.   Prox RCA to Mid RCA lesion is 10% stenosed.   Mid Cx lesion is 15% stenosed.   Mid LAD to Dist LAD lesion is 20% stenosed.   A drug-eluting stent was successfully placed using a STENT SYNERGY XD 3.0X12.   Post intervention, there is a 0% residual stenosis.   LV end diastolic pressure is normal.   Recommend uninterrupted dual antiplatelet therapy with Aspirin  81mg  daily and Clopidogrel  75mg  daily for a minimum of 12 months (ACS-Class I recommendation).  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 RFR 0.62 Normal LVEDP 15 mm Hg Successful PCI of the LAD with DES x  1  Plan: DAPT for one year. Anticipate DC in am  Findings Coronary Findings Diagnostic  Dominance: Right  Left Anterior Descending Prox LAD to Mid LAD lesion is 85% stenosed. Pressure gradient was performed on the lesion. RFR:  0.62. Mid LAD to Dist LAD lesion is 20% stenosed.  Left Circumflex Mid Cx lesion is 15% stenosed.  Right Coronary Artery Prox RCA to Mid RCA lesion is 10% stenosed. The lesion is moderately calcified.  Intervention  Prox LAD to Mid LAD lesion Stent CATH VISTA GUIDE 6FR XBLD 3.5 guide catheter was inserted. Lesion crossed with guidewire using a GUIDEWIRE PRESSURE X 175. Pre-stent angioplasty was performed using a BALLOON EMERGE MR 2.5X12. A drug-eluting stent was successfully placed using a STENT SYNERGY XD 3.0X12. Stent strut is well apposed. Post-stent angioplasty was performed using a BALLOON [place] EMERGE MR 3.25X8. Maximum pressure:  14 atm. Post-Intervention Lesion Assessment The intervention was successful. Pre-interventional TIMI flow is 3. Post-intervention TIMI flow is 3. No complications occurred at this lesion. There is a 0% residual stenosis post intervention.   STRESS TESTS  MYOCARDIAL PERFUSION IMAGING 12/20/2019  Narrative  The left ventricular ejection fraction is hyperdynamic (>65%).  Nuclear stress EF: 66%.  There was no ST segment deviation noted during stress.  Defect 1: There is a small defect of moderate severity present in the apical anterior and apex location.  This is a low risk study.  Low risk stress nuclear study with partially reversible distal anterior/apical defect likely related to shifting breast attenuation.  Cannot rule out minimal ischemia.  Gated ejection fraction 66% with normal wall motion.   ECHOCARDIOGRAM  ECHOCARDIOGRAM COMPLETE 08/24/2023  Narrative ECHOCARDIOGRAM REPORT    Patient Name:   [name] Date of Exam: 08/24/2023 Medical Rec #:  [id number]        Height:       66.0 in Accession #:    [id number]       Weight:       172.8 lb Date of Birth:  Jun 30, 1950        BSA:          1.879 m Patient Age:    72 years         BP:           120/60 mmHg Patient Gender: F                HR:           64 bpm. Exam Location:   Outpatient  Procedure: 2D Echo, 3D Echo, Cardiac Doppler, Color Doppler and Strain Analysis (Both Spectral and Color Flow Doppler were utilized during procedure).  Indications:    CHF  History:        Patient has prior history of Echocardiogram examinations, most recent 09/04/2022. CAD, TIA, Arrythmias:Tachycardia, Signs/Symptoms:Syncope; Risk Factors:Hypertension, Dyslipidemia and Former Smoker. Pulmonary Embolism.  Sonographer:    Gelene Kelly RDCS Referring Phys: [id number] CARINA M BROWN   Sonographer Comments: Patient reports increased shortness of breath since 06/2023. Patient states she is unable to walk more than 15 minutes without needing to stop and rest. IMPRESSIONS   1. Left ventricular ejection fraction, by estimation, is 60 to 65%. The left ventricle has normal function. The left ventricle has no regional wall motion abnormalities. Left ventricular diastolic parameters are consistent with Grade I diastolic dysfunction (impaired relaxation). The average left ventricular global longitudinal strain is -16.2 %. The global longitudinal strain is abnormal. 2. Right ventricular systolic function is normal. The  right ventricular size is normal. There is normal pulmonary artery systolic pressure. The estimated right ventricular systolic pressure is 20.6 mmHg. 3. The mitral valve is grossly normal. Trivial mitral valve regurgitation. 4. The aortic valve is tricuspid. Aortic valve regurgitation is mild. Aortic valve sclerosis/calcification is present, without any evidence of aortic stenosis. 5. The inferior vena cava is normal in size with greater than 50% respiratory variability, suggesting right atrial pressure of 3 mmHg.  Comparison(s): No significant change from prior study. 09/04/2022: LVEF 60-65%.  FINDINGS Left Ventricle: Left ventricular ejection fraction, by estimation, is 60 to 65%. The left ventricle has normal function. The left ventricle has no regional wall motion  abnormalities. The average left ventricular global longitudinal strain is -16.2 %. Strain was performed and the global longitudinal strain is abnormal. 3D ejection fraction reviewed and evaluated as part of the interpretation. Alternate measurement of EF is felt to be most reflective of LV function. The left ventricular internal cavity size was normal in size. There is no left ventricular hypertrophy. Left ventricular diastolic parameters are consistent with Grade I diastolic dysfunction (impaired relaxation). Indeterminate filling pressures.  Right Ventricle: The right ventricular size is normal. No increase in right ventricular wall thickness. Right ventricular systolic function is normal. There is normal pulmonary artery systolic pressure. The tricuspid regurgitant velocity is 2.10 m/s, and with an assumed right atrial pressure of 3 mmHg, the estimated right ventricular systolic pressure is 20.6 mmHg.  Left Atrium: Left atrial size was normal in size.  Right Atrium: Right atrial size was normal in size.  Pericardium: There is no evidence of pericardial effusion.  Mitral Valve: The mitral valve is grossly normal. Trivial mitral valve regurgitation.  Tricuspid Valve: The tricuspid valve is grossly normal. Tricuspid valve regurgitation is trivial.  Aortic Valve: The aortic valve is tricuspid. Aortic valve regurgitation is mild. Aortic regurgitation PHT measures 514 msec. Aortic valve sclerosis/calcification is present, without any evidence of aortic stenosis. Aortic valve mean gradient measures 9.0 mmHg. Aortic valve peak gradient measures 17.2 mmHg. Aortic valve area, by VTI measures 1.57 cm.  Pulmonic Valve: The pulmonic valve was not well visualized. Pulmonic valve regurgitation is not visualized.  Aorta: The aortic root and ascending aorta are structurally normal, with no evidence of dilitation.  Venous: The inferior vena cava is normal in size with greater than 50% respiratory  variability, suggesting right atrial pressure of 3 mmHg.  IAS/Shunts: No atrial level shunt detected by color flow Doppler.  Additional Comments: 3D was performed not requiring image post processing on an independent workstation and was indeterminate.   LEFT VENTRICLE PLAX 2D LVIDd:         4.78 cm   Diastology LVIDs:         2.59 cm   LV e' medial:    6.53 cm/s LV PW:         0.74 cm   LV E/e' medial:  11.4 LV IVS:        0.71 cm   LV e' lateral:   6.96 cm/s LVOT diam:     1.90 cm   LV E/e' lateral: 10.7 LV SV:         68 LV SV Index:   36        2D Longitudinal Strain LVOT Area:     2.84 cm  2D Strain GLS (A4C):   -18.1 % 2D Strain GLS (A3C):   -15.6 % 2D Strain GLS (A2C):   -14.9 % 2D Strain GLS Avg:     -  16.2 %  3D Volume EF: 3D EF:        50 % LV EDV:       107 ml LV ESV:       53 ml LV SV:        54 ml  RIGHT VENTRICLE RV Basal diam:  2.90 cm RV Mid diam:    2.77 cm RV S prime:     12.30 cm/s TAPSE (M-mode): 1.9 cm  LEFT ATRIUM             Index        RIGHT ATRIUM           Index LA diam:        3.40 cm 1.81 cm/m   RA Area:     11.80 cm LA Vol (A2C):   37.4 ml 19.90 ml/m  RA Volume:   24.70 ml  13.14 ml/m LA Vol (A4C):   45.0 ml 23.94 ml/m LA Biplane Vol: 44.4 ml 23.63 ml/m AORTIC VALVE AV Area (Vmax):    1.60 cm AV Area (Vmean):   1.57 cm AV Area (VTI):     1.57 cm AV Vmax:           207.50 cm/s AV Vmean:          134.500 cm/s AV VTI:            0.432 m AV Peak Grad:      17.2 mmHg AV Mean Grad:      9.0 mmHg LVOT Vmax:         117.00 cm/s LVOT Vmean:        74.400 cm/s LVOT VTI:          0.240 m LVOT/AV VTI ratio: 0.55 AI PHT:            514 msec AR Vena Contracta: 0.27 cm  AORTA Ao Root diam: 2.60 cm Ao Asc diam:  3.20 cm  MITRAL VALVE               TRICUSPID VALVE MV Area (PHT): 2.99 cm    TR Peak grad:   17.6 mmHg MV Decel Time: 254 msec    TR Vmax:        210.00 cm/s MV E velocity: 74.40 cm/s MV A velocity: 82.80 cm/s  SHUNTS MV  E/A ratio:  0.90        Systemic VTI:  0.24 m Systemic Diam: 1.90 cm  Dinah Franco MD Electronically signed by Dinah Franco MD Signature Date/Time: 08/24/2023/11:05:58 AM    Final   TEE  ECHO TEE 12/19/2015  Narrative *[facility]* *[facility]* [address] [place], [place] [address] [phone]  ------------------------------------------------------------------- Transesophageal Echocardiography  Patient:    [name], [name] MR #:       [id number] Study Date: 12/19/2015 Gender:     F Age:        68 Height: Weight: BSA: Pt. Status: Room:  Kenith Payer, M.D. PERFORMING   Janelle Mediate, M.D. REFERRING    Janelle Mediate, M.D. ATTENDING    Ccs, Call [phone] ADMITTING    Ccs, Md SONOGRAPHER  Gelene Kelly  cc:  ------------------------------------------------------------------- LV EF: 55% -   60%  ------------------------------------------------------------------- Indications:      Bacteremia 790.7.  ------------------------------------------------------------------- History:   PMH:  Infection by Streptococcus, viridans group. Transient ischemic attack.  Risk factors:  Hypertension. Dyslipidemia.  ------------------------------------------------------------------- Study Conclusions  - Left ventricle: The cavity size was normal.  Wall thickness was normal. Systolic function was normal. The estimated ejection fraction was in the range of 55% to 60%. - Aortic valve: There was trivial regurgitation. - Mitral valve: There was mild regurgitation. - Right atrium: No evidence of thrombus in the atrial cavity or appendage. - Atrial septum: There was increased thickness of the septum, consistent with lipomatous hypertrophy. No defect or patent foramen ovale was identified.  Impressions:  - No evidence of endocarditis. There was no evidence of  a vegetation.  ------------------------------------------------------------------- Study data:   Study status:  Routine.  Consent:  The risks, benefits, and alternatives to the procedure were explained to the patient and informed consent was obtained.  Procedure:  Initial setup. The patient was brought to the laboratory. Surface ECG leads were monitored. Sedation. Conscious sedation was administered. Transesophageal echocardiography. Topical anesthesia was obtained using viscous lidocaine . A transesophageal probe was inserted by the attending cardiologist. Image quality was adequate.  Study completion:  The patient tolerated the procedure well. There were no complications.  Administered medications:   Fentanyl , 100mcg. Midazolam , 5mg .          Diagnostic transesophageal echocardiography.  2D and color Doppler.  Birthdate:  Patient birthdate: Oct 17, 1950.  Age:  Patient is 72 yr old.  Sex:  Gender: female.  Blood pressure:     162/88  Patient status:  Inpatient. Study date:  Study date: 12/19/2015. Study time: 02:58 PM. Location:  Endoscopy.  -------------------------------------------------------------------  ------------------------------------------------------------------- Left ventricle:  The cavity size was normal. Wall thickness was normal. Systolic function was normal. The estimated ejection fraction was in the range of 55% to 60%.  ------------------------------------------------------------------- Aortic valve:   Mildly thickened leaflets.  Doppler:  There was trivial regurgitation.  ------------------------------------------------------------------- Aorta:  The aorta was normal, not dilated, and non-diseased.  ------------------------------------------------------------------- Mitral valve:   Doppler:  There was mild regurgitation.  ------------------------------------------------------------------- Left atrium:  The atrium was normal in  size.  ------------------------------------------------------------------- Atrial septum:  There was increased thickness of the septum, consistent with lipomatous hypertrophy. No defect or patent foramen ovale was identified.  ------------------------------------------------------------------- Right ventricle:  The cavity size was normal. Wall thickness was normal. Systolic function was normal.  ------------------------------------------------------------------- Pulmonic valve:    Doppler:  There was trivial regurgitation.  ------------------------------------------------------------------- Tricuspid valve:   Doppler:  There was mild regurgitation.  ------------------------------------------------------------------- Right atrium:  The atrium was normal in size.  No evidence of thrombus in the atrial cavity or appendage.  ------------------------------------------------------------------- Pericardium:  The pericardium was normal in appearance. There was no pericardial effusion.  ------------------------------------------------------------------- Post procedure conclusions Ascending Aorta:  - The aorta was normal, not dilated, and non-diseased.  ------------------------------------------------------------------- Prepared and Electronically Authenticated by  Janelle Mediate, M.D. 2017-10-11T15:48:06        ______________________________________________________________________________________________      Risk Assessment/Calculations           Physical Exam VS:  BP 92/60   Pulse 81   Ht 5' 6 (1.676 m)   Wt 175 lb (79.4 kg)   BMI 28.25 kg/m        Wt Readings from Last 3 Encounters:  08/28/23 175 lb (79.4 kg)  08/25/23 170 lb 6.4 oz (77.3 kg)  08/24/23 174 lb (78.9 kg)    GEN: Well nourished, well developed in no acute distress NECK: No JVD; No carotid bruits CARDIAC: RRR, no murmurs, rubs, gallops RESPIRATORY:  Clear to auscultation without rales, wheezing or  rhonchi  ABDOMEN: Soft, non-tender, non-distended EXTREMITIES:  No edema; No deformity.  Right radial cath site  with purple and yellow ecchymosis but no hematoma nor infection.  ASSESSMENT AND PLAN  CAD s/p DES-LAD 08/25/2023- R radial bandage removed in clinic. Site is healing appropriately with expected purple/yellow ecchymosis, no hematoma, and no evidence of infection. Exertional dyspnea much improved post procedure. Mild chest pain which is overall improving. Expect this will continue to improve.  Continue DAPT Aspirin  81mg  daily and Plavix  75mg  daily for 1 year.  Additional GDMT includes Incliseron, Rosuvastatin  5mg  daily (intolerant of higher doses).  Cardioselective beta blocker previously deferred as she was on propranolol  for migraine. No longer on Propranolol . Consider Metoprolol at follow up pending BP - defer today as relatively hypotensive.  Encouraged to participate in cardiac rehab.   HLD, LDL goal from 55-continue rosuvastatin  5 mg daily and inclisiran. 08/25/23 LDL 32. Lp(a) unremarkable - no evidence of familial hyperlipidemia.   History of CVA-continue Plavix , statin.  HTN - Relatively hypotensive. Reports no lightheadedness, dizziness. Discussed to monitor BP at home at least 2 hours after medications and sitting for 5-10 minutes. Continue Amlodipine  5mg  daily, Losartan  25mg  daily. If hypotension becomes symptomatic, plan to reduce vs discontinue Losartan .  CKD 3A - Careful titration of diuretic and antihypertensive.  Consider SGLT2i for renal protective benefit at follow up. Defer today due to relative hypotension.   OSA - CPAP compliance encouraged.   Chronic diastolic heart failure - Euvolemic and well compensated on exam. Managed with Lasix  40mg  PRN. Consider SGLT2i at follow up. Taking approximately every other day. Low sodium diet, fluid restriction <2L, and daily weights encouraged. Educated to contact our office for weight gain of 2 lbs overnight or 5 lbs in one week.      Cardiac Rehabilitation Eligibility Assessment  The patient is ready to start cardiac rehabilitation from a cardiac standpoint.       Dispo: follow up in 2-3 months  Signed, Clearnce Curia, NP

## 2023-08-28 NOTE — Telephone Encounter (Signed)
 Pr returned phone call for cardiac rehab and stated that she is interested in the cardiac rehab program. I advised pt that once she has been cleared we will give her a call back to schedule. Pt understood.

## 2023-08-28 NOTE — Telephone Encounter (Signed)
 Pt insurance is active and benefits verified through Medicare a/b Co-pay 0, DED $257/$257 met, out of pocket 0/0 met, co-insurance 20%. no pre-authorization required  2ndary insurance is active and benefits verified through [employer]. Co-pay 0, DED 0/0 met, out of pocket 0/0 met, co-insurance 0%. No pre-authorization required  How many CR sessions are covered? (ICR)72 Is this a lifetime maximum or an annual maximum? annual Has the member used any of these services to date? no Is there a time limit (weeks/months) on start of program and/or program completion? no   Will contact patient to see if she is interested in the Cardiac Rehab Program. If interested, patient will need to complete follow up appt. Once completed, patient will be contacted for scheduling upon review by the RN Navigator.

## 2023-08-28 NOTE — Patient Instructions (Signed)
 Medication Instructions:  Your physician recommends that you continue on your current medications as directed. Please refer to the Current Medication list given to you today.  Follow-Up:  Please follow up in 2-3 months with Dr. Theodis Fiscal, Slater Duncan, NP or Neomi Banks, NP   Other Instructions Keep wrist site covered when not at home until Monday!

## 2023-08-31 ENCOUNTER — Ambulatory Visit (INDEPENDENT_AMBULATORY_CARE_PROVIDER_SITE_OTHER): Payer: Self-pay | Admitting: Student

## 2023-08-31 ENCOUNTER — Encounter: Payer: Self-pay | Admitting: Student

## 2023-08-31 VITALS — BP 154/75 | HR 87 | Ht 66.0 in | Wt 175.0 lb

## 2023-08-31 DIAGNOSIS — I10 Essential (primary) hypertension: Secondary | ICD-10-CM | POA: Diagnosis not present

## 2023-08-31 DIAGNOSIS — I2510 Atherosclerotic heart disease of native coronary artery without angina pectoris: Secondary | ICD-10-CM

## 2023-08-31 NOTE — Patient Instructions (Signed)
 It was great to see you! Thank you for allowing me to participate in your care!  I recommend that you always bring your medications to each appointment as this makes it easy to ensure you are on the correct medications and helps us  not miss when refills are needed.  Our plans for today:  - return in 2-4 weeks to meet new PCP. If blood pressure is stable, will plant to start  a beta blocker medication  - I recommend bring medications to next apt    Take care and seek immediate care sooner if you develop any concerns.   Dr. Lauraine Molt, DO [facility]

## 2023-08-31 NOTE — Progress Notes (Unsigned)
    SUBJECTIVE:   CHIEF COMPLAINT / HPI:   Unstable angina Hospitalized from 6/16-6/18 for unstable angina requiring LHC revealing 85% stenosis, stent placed.  Plan for DAPT therapy for 1 year Had f/u with cards on 08/28/2023 -they discussed starting beta-blocker and SGLT2 at future visit, she was mildly hypotensive.  PERTINENT  PMH / PSH: Diastolic heart failure, CAD s/p stent placement, history CVA, HTN, CKD 3A, migraines  OBJECTIVE:   BP (!) 154/75   Pulse 87   Ht 5' 6 (1.676 m)   Wt 175 lb (79.4 kg)   SpO2 99%   BMI 28.25 kg/m    General: NAD, pleasant, able to participate in exam Cardiac: RRR, no murmurs. Respiratory: CTAB, normal effort, No wheezes, rales or rhonchi Abdomen: Bowel sounds present, nontender, nondistended, no hepatosplenomegaly. Extremities: no edema or cyanosis. Skin: warm and dry, no rashes noted Neuro: alert, no obvious focal deficits Psych: Normal affect and mood  ASSESSMENT/PLAN:   No problem-specific Assessment & Plan notes found for this encounter.     Dr. Lauraine Molt, DO  [facility]    {    This will disappear when note is signed, click to select method of visit    :1}

## 2023-09-01 NOTE — Assessment & Plan Note (Addendum)
 Post-stent placement, she reports improvement with no chest pain or dyspnea.  - Continue DAPT for 12 months per cards with aspirin  and Plavix . - Pt to return for BP check in about 2 weeks, if BP stable, plan to add coreg 3.125 mg BID - If BP continues to remain stable with coreg after a couple weeks, can then also add SGLT-2 for GDMT (also has diastolic heart failure and CKD) - Continue arb and statin  - Has f/u apt with cardiology in September

## 2023-09-01 NOTE — Assessment & Plan Note (Signed)
 Uncontrolled today, possibly in setting of heat  In past, BP has fluctuated significantly with lows and highs.  Continue Amlodipine  and losartan , plan to add coreg at future visit as above

## 2023-09-02 ENCOUNTER — Ambulatory Visit: Admitting: Student

## 2023-09-15 ENCOUNTER — Encounter: Payer: Self-pay | Admitting: Family Medicine

## 2023-09-15 ENCOUNTER — Ambulatory Visit: Admitting: Family Medicine

## 2023-09-15 VITALS — BP 140/78 | HR 73 | Ht 66.0 in | Wt 170.8 lb

## 2023-09-15 DIAGNOSIS — I10 Essential (primary) hypertension: Secondary | ICD-10-CM | POA: Diagnosis not present

## 2023-09-15 DIAGNOSIS — I2510 Atherosclerotic heart disease of native coronary artery without angina pectoris: Secondary | ICD-10-CM

## 2023-09-15 DIAGNOSIS — I5032 Chronic diastolic (congestive) heart failure: Secondary | ICD-10-CM | POA: Diagnosis not present

## 2023-09-15 MED ORDER — CARVEDILOL 3.125 MG PO TABS
3.1250 mg | ORAL_TABLET | Freq: Two times a day (BID) | ORAL | 1 refills | Status: DC
Start: 2023-09-15 — End: 2023-10-09

## 2023-09-15 NOTE — Patient Instructions (Signed)
 It was great to see you again today.  Start carvedilol  3.125mg  twice daily Return next week as scheduled to follow up on your blood pressure again  Call with any questions  Be well, Dr. Donah

## 2023-09-15 NOTE — Assessment & Plan Note (Signed)
 Euvolemic today, consider adding SGLT2 in the future

## 2023-09-15 NOTE — Assessment & Plan Note (Signed)
 Slightly elevated today, adding carvedilol  3.125mg  twice daily which should hopefully help, recheck at follow up next week

## 2023-09-15 NOTE — Progress Notes (Signed)
  Date of Visit: 09/15/2023   SUBJECTIVE:   HPI:  [name] presents today for follow up of CAD and hypertension.  Had stent placed mid June, discharged on DAPT for 12 months. Has been taking amlodipine  5mg  daily and losartan  25mg  daily. Overall feeling well.  Denies any chest pain or shortness of breath.  No lower extremity edema.  Has checked blood pressure at home and it has run 130s/80s at the lowest.  OBJECTIVE:   BP (!) 140/78   Pulse 73   Ht 5' 6 (1.676 m)   Wt 170 lb 12.8 oz (77.5 kg)   SpO2 96%   BMI 27.57 kg/m  Gen: No acute distress, pleasant, cooperative, well-appearing HEENT: Normocephalic, atraumatic Heart: Regular rate and rhythm, no murmur Lungs: Clear bilaterally, normal effort Neuro: Grossly nonfocal, speech normal Ext: No edema  ASSESSMENT/PLAN:   Assessment & Plan Coronary artery disease involving native heart, unspecified vessel or lesion type, unspecified whether angina present Doing well, continue DAPT Add carvedilol  3.125mg  twice daily  Follow up next week with PCP to monitor blood pressure on this medication  Essential hypertension Slightly elevated today, adding carvedilol  3.125mg  twice daily which should hopefully help, recheck at follow up next week Chronic diastolic heart failure (HCC) Euvolemic today, consider adding SGLT2 in the future    FOLLOW UP: Follow up in 1 week for blood pressure recheck with PCP  [place] J. Donah, MD [facility]

## 2023-09-15 NOTE — Assessment & Plan Note (Signed)
 Doing well, continue DAPT Add carvedilol  3.125mg  twice daily  Follow up next week with PCP to monitor blood pressure on this medication

## 2023-09-16 NOTE — Progress Notes (Deleted)
  PROCEDURE RECORD [facility]   Name: [name] DOB:[birth year] MRN: [id number]  Date:09/16/2023  Physician: Prentice Compton MD    Nurse/CMA: Tye Kitty MA  Allergies:  Allergies  Allergen Reactions   Iodinated Contrast Media Shortness Of Breath and Itching    States she had this reaction connected with a MRI of the cervical spine.   Shellfish Allergy Anaphylaxis    All seafood   Crestor  [Rosuvastatin ] Other (See Comments)    Anxiety, itching, headaches, myalgias Able to tolerate 5mg  daily   Lipitor [Atorvastatin ] Other (See Comments)    Anxiety, itching, headaches, myalgias   Statins Other (See Comments)    Arthralgias (severe) with atorvastatin , mild arthralgias with rosuvastatin  but willing to continue taking it   Iodine     Consent Signed: {yes no:314532}  Is patient diabetic? {yes no:314532}  CBG today? ***  Pregnant: {yes no:314532} LMP: No LMP recorded. Patient has had a hysterectomy. (age 82-55)  Anticoagulants: {Yes/No:19989} Anti-inflammatory: {Yes/No:19989} Antibiotics: {Yes/No:19989}  Procedure: Left sided MBB L3-4-5   Position: Prone Start Time: ***  End Time: ***  Fluoro Time: ***  RN/CMA      Time      BP      Pulse      Respirations      O2 Sat      S/S      Pain Level       D/C home with ***, [name] & O X 3, D/C instructions reviewed, and sits independently.         Subjective:    Patient ID: [name], female    DOB: 1951/02/12, 73 y.o.   MRN: [id number]  HPI    Review of Systems     Objective:   Physical Exam        Assessment & Plan:

## 2023-09-17 ENCOUNTER — Encounter: Admitting: Physical Medicine & Rehabilitation

## 2023-09-23 ENCOUNTER — Ambulatory Visit (INDEPENDENT_AMBULATORY_CARE_PROVIDER_SITE_OTHER): Payer: Self-pay | Admitting: Family Medicine

## 2023-09-23 ENCOUNTER — Encounter: Payer: Self-pay | Admitting: Family Medicine

## 2023-09-23 VITALS — BP 106/66 | HR 55 | Ht 66.0 in | Wt 176.2 lb

## 2023-09-23 DIAGNOSIS — I10 Essential (primary) hypertension: Secondary | ICD-10-CM

## 2023-09-23 NOTE — Assessment & Plan Note (Addendum)
 BP at goal. Patient denies dizziness or falls. Consider decreasing amlodipine  to 2.5 mg daily given BP today. Will continue current regimen and follow up in 1 month. - amlodipine  5 mg daily --> will message patient to discuss decrease to 2.5 mg daily - losartan  25mg  daily - coreg  3.125 mg twice daily

## 2023-09-23 NOTE — Progress Notes (Addendum)
    SUBJECTIVE:   CHIEF COMPLAINT / HPI:   Here for blood pressure follow up. Lowest its gotten has been 107/60. No dizziness, no falls. Reports doing well on current regimen. Denies chest pain or shortness of breath. Is awaiting call from cardiac rehab.    OBJECTIVE:   BP 106/66   Pulse (!) 55   Ht 5' 6 (1.676 m)   Wt 176 lb 4 oz (79.9 kg)   SpO2 96%   BMI 28.45 kg/m   General: A&O, NAD HEENT: No sign of trauma, EOM grossly intact Cardiac: RRR, no m/r/g Respiratory: CTAB, normal WOB, no w/c/r GI: Soft, NTTP, non-distended  Extremities: NTTP, no peripheral edema. Neuro: Normal gait, moves all four extremities appropriately. Psych: Appropriate mood and affect   ASSESSMENT/PLAN:   Assessment & Plan Essential hypertension BP at goal. Patient denies dizziness or falls. Consider decreasing amlodipine  to 2.5 mg daily given BP today. Will continue current regimen and follow up in 1 month. - amlodipine  5 mg daily --> will message patient to discuss decrease to 2.5 mg daily - losartan  25mg  daily - coreg  3.125 mg twice daily     1 month follow up   Gloriann Ogren, MD [facility]

## 2023-09-23 NOTE — Patient Instructions (Addendum)
 It was wonderful to see you today.  Please bring ALL of your medications with you to every visit.   Today we talked about:  Your blood pressure looks great!  Lets keep your current regimen as is and follow up in 1 month:  BP regimen: - amlodipine  5 mg - losartan  25 mg - coreg  3.125 mg twice dialy   Thank you for choosing [facility].   Please call [phone] with any questions about today's appointment.  Please arrive at least 15 minutes prior to your scheduled appointments.   If you had blood work today, I will send you a MyChart message or a letter if results are normal. Otherwise, I will give you a call.   If you had a referral placed, they will call you to set up an appointment. Please give us  a call if you don't hear back in the next 2 weeks.   If you need additional refills before your next appointment, please call your pharmacy first.   You should follow up in our clinic in Return in about 4 weeks (around 10/21/2023) for follow up .  Gloriann Ogren, MD Family Medicine

## 2023-09-25 ENCOUNTER — Telehealth (HOSPITAL_COMMUNITY): Payer: Self-pay

## 2023-09-25 NOTE — Telephone Encounter (Signed)
 Called patient to schedule cardiac rehab. Patient stated they are not interested at this time, informed her if she changes her mind to call us  back to reopen the referral.  Closing referral.

## 2023-10-02 ENCOUNTER — Other Ambulatory Visit: Payer: Self-pay

## 2023-10-02 DIAGNOSIS — I10 Essential (primary) hypertension: Secondary | ICD-10-CM

## 2023-10-02 MED ORDER — LOSARTAN POTASSIUM 25 MG PO TABS: 25.0000 mg | ORAL_TABLET | Freq: Every day | ORAL | 0 refills | Status: DC

## 2023-10-07 ENCOUNTER — Emergency Department (HOSPITAL_COMMUNITY)

## 2023-10-07 ENCOUNTER — Ambulatory Visit (INDEPENDENT_AMBULATORY_CARE_PROVIDER_SITE_OTHER): Admitting: Family Medicine

## 2023-10-07 ENCOUNTER — Observation Stay (HOSPITAL_COMMUNITY)
Admission: EM | Admit: 2023-10-07 | Discharge: 2023-10-09 | Disposition: A | Discharge status: Home / Self Care | Attending: Family Medicine | Admitting: Family Medicine

## 2023-10-07 ENCOUNTER — Other Ambulatory Visit: Payer: Self-pay

## 2023-10-07 ENCOUNTER — Encounter (HOSPITAL_COMMUNITY): Payer: Self-pay

## 2023-10-07 ENCOUNTER — Encounter: Payer: Self-pay | Admitting: Family Medicine

## 2023-10-07 VITALS — BP 154/78 | HR 88 | Ht 66.0 in | Wt 174.0 lb

## 2023-10-07 DIAGNOSIS — Z86711 Personal history of pulmonary embolism: Secondary | ICD-10-CM | POA: Diagnosis not present

## 2023-10-07 DIAGNOSIS — R0789 Other chest pain: Secondary | ICD-10-CM | POA: Diagnosis not present

## 2023-10-07 DIAGNOSIS — Z87891 Personal history of nicotine dependence: Secondary | ICD-10-CM | POA: Diagnosis not present

## 2023-10-07 DIAGNOSIS — I2510 Atherosclerotic heart disease of native coronary artery without angina pectoris: Secondary | ICD-10-CM | POA: Diagnosis not present

## 2023-10-07 DIAGNOSIS — R29898 Other symptoms and signs involving the musculoskeletal system: Principal | ICD-10-CM | POA: Insufficient documentation

## 2023-10-07 DIAGNOSIS — I5032 Chronic diastolic (congestive) heart failure: Secondary | ICD-10-CM

## 2023-10-07 DIAGNOSIS — N182 Chronic kidney disease, stage 2 (mild): Secondary | ICD-10-CM | POA: Diagnosis not present

## 2023-10-07 DIAGNOSIS — Z7902 Long term (current) use of antithrombotics/antiplatelets: Secondary | ICD-10-CM | POA: Diagnosis not present

## 2023-10-07 DIAGNOSIS — R600 Localized edema: Secondary | ICD-10-CM | POA: Diagnosis not present

## 2023-10-07 DIAGNOSIS — I10 Essential (primary) hypertension: Secondary | ICD-10-CM | POA: Diagnosis present

## 2023-10-07 DIAGNOSIS — Z79899 Other long term (current) drug therapy: Secondary | ICD-10-CM | POA: Diagnosis not present

## 2023-10-07 DIAGNOSIS — Z7982 Long term (current) use of aspirin: Secondary | ICD-10-CM | POA: Diagnosis not present

## 2023-10-07 DIAGNOSIS — R296 Repeated falls: Secondary | ICD-10-CM | POA: Diagnosis not present

## 2023-10-07 DIAGNOSIS — R0600 Dyspnea, unspecified: Secondary | ICD-10-CM | POA: Diagnosis not present

## 2023-10-07 DIAGNOSIS — Z8673 Personal history of transient ischemic attack (TIA), and cerebral infarction without residual deficits: Secondary | ICD-10-CM | POA: Diagnosis not present

## 2023-10-07 DIAGNOSIS — I160 Hypertensive urgency: Secondary | ICD-10-CM | POA: Insufficient documentation

## 2023-10-07 DIAGNOSIS — I130 Hypertensive heart and chronic kidney disease with heart failure and stage 1 through stage 4 chronic kidney disease, or unspecified chronic kidney disease: Secondary | ICD-10-CM | POA: Insufficient documentation

## 2023-10-07 DIAGNOSIS — M170 Bilateral primary osteoarthritis of knee: Secondary | ICD-10-CM | POA: Diagnosis not present

## 2023-10-07 DIAGNOSIS — R42 Dizziness and giddiness: Secondary | ICD-10-CM | POA: Diagnosis present

## 2023-10-07 DIAGNOSIS — G8929 Other chronic pain: Secondary | ICD-10-CM | POA: Insufficient documentation

## 2023-10-07 DIAGNOSIS — Z789 Other specified health status: Secondary | ICD-10-CM

## 2023-10-07 DIAGNOSIS — R0602 Shortness of breath: Secondary | ICD-10-CM | POA: Diagnosis not present

## 2023-10-07 LAB — MAGNESIUM: Magnesium: 2.2 mg/dL (ref 1.7–2.4)

## 2023-10-07 LAB — CBC WITH DIFFERENTIAL/PLATELET
Abs Immature Granulocytes: 0.06 K/uL (ref 0.00–0.07)
Basophils Absolute: 0 K/uL (ref 0.0–0.1)
Basophils Relative: 1 %
Eosinophils Absolute: 0 K/uL (ref 0.0–0.5)
Eosinophils Relative: 0 %
HCT: 43.4 % (ref 36.0–46.0)
Hemoglobin: 14 g/dL (ref 12.0–15.0)
Immature Granulocytes: 1 %
Lymphocytes Relative: 8 %
Lymphs Abs: 0.5 K/uL — ABNORMAL LOW (ref 0.7–4.0)
MCH: 28.7 pg (ref 26.0–34.0)
MCHC: 32.3 g/dL (ref 30.0–36.0)
MCV: 89.1 fL (ref 80.0–100.0)
Monocytes Absolute: 0 K/uL — ABNORMAL LOW (ref 0.1–1.0)
Monocytes Relative: 1 %
Neutro Abs: 5.4 K/uL (ref 1.7–7.7)
Neutrophils Relative %: 89 %
Platelets: 239 K/uL (ref 150–400)
RBC: 4.87 MIL/uL (ref 3.87–5.11)
RDW: 13.3 % (ref 11.5–15.5)
WBC: 6.1 K/uL (ref 4.0–10.5)
nRBC: 0 % (ref 0.0–0.2)

## 2023-10-07 LAB — COMPREHENSIVE METABOLIC PANEL WITH GFR
ALT: 18 U/L (ref 0–44)
AST: 25 U/L (ref 15–41)
Albumin: 3.9 g/dL (ref 3.5–5.0)
Alkaline Phosphatase: 97 U/L (ref 38–126)
Anion gap: 10 (ref 5–15)
BUN: 14 mg/dL (ref 8–23)
CO2: 23 mmol/L (ref 22–32)
Calcium: 8.9 mg/dL (ref 8.9–10.3)
Chloride: 107 mmol/L (ref 98–111)
Creatinine, Ser: 1.14 mg/dL — ABNORMAL HIGH (ref 0.44–1.00)
GFR, Estimated: 51 mL/min — ABNORMAL LOW (ref >60)
Glucose, Bld: 153 mg/dL — ABNORMAL HIGH (ref 70–99)
Potassium: 3.9 mmol/L (ref 3.5–5.1)
Sodium: 140 mmol/L (ref 135–145)
Total Bilirubin: 0.7 mg/dL (ref 0.0–1.2)
Total Protein: 6.5 g/dL (ref 6.5–8.1)

## 2023-10-07 LAB — TROPONIN I (HIGH SENSITIVITY)
Troponin I (High Sensitivity): 6 ng/L (ref <18)
Troponin I (High Sensitivity): 6 ng/L (ref <18)

## 2023-10-07 LAB — BRAIN NATRIURETIC PEPTIDE: B Natriuretic Peptide: 58.7 pg/mL (ref 0.0–100.0)

## 2023-10-07 MED ORDER — CLOPIDOGREL BISULFATE 75 MG PO TABS
75.0000 mg | ORAL_TABLET | Freq: Every day | ORAL | Status: DC
  Administered 2023-10-07 – 2023-10-09 (×3): 75 mg via ORAL
  Filled 2023-10-07 (×3): qty 1

## 2023-10-07 MED ORDER — TRAMADOL HCL 50 MG PO TABS
50.0000 mg | ORAL_TABLET | Freq: Two times a day (BID) | ORAL | Status: DC | PRN
  Administered 2023-10-08 (×2): 50 mg via ORAL
  Filled 2023-10-07 (×2): qty 1

## 2023-10-07 MED ORDER — SERTRALINE HCL 100 MG PO TABS
100.0000 mg | ORAL_TABLET | Freq: Every day | ORAL | Status: DC
  Administered 2023-10-07 – 2023-10-09 (×3): 100 mg via ORAL
  Filled 2023-10-07 (×3): qty 1

## 2023-10-07 MED ORDER — AMLODIPINE BESYLATE 5 MG PO TABS
2.5000 mg | ORAL_TABLET | Freq: Every day | ORAL | Status: DC
  Administered 2023-10-07: 2.5 mg via ORAL
  Filled 2023-10-07: qty 1

## 2023-10-07 MED ORDER — LOSARTAN POTASSIUM 50 MG PO TABS
50.0000 mg | ORAL_TABLET | ORAL | Status: AC
  Administered 2023-10-07: 50 mg via ORAL
  Filled 2023-10-07: qty 1

## 2023-10-07 MED ORDER — LOSARTAN POTASSIUM 50 MG PO TABS
25.0000 mg | ORAL_TABLET | Freq: Every day | ORAL | Status: DC
Start: 2023-10-08 — End: 2023-10-09
  Administered 2023-10-08 – 2023-10-09 (×2): 25 mg via ORAL
  Filled 2023-10-07 (×2): qty 1

## 2023-10-07 MED ORDER — FUROSEMIDE 40 MG PO TABS
40.0000 mg | ORAL_TABLET | Freq: Every day | ORAL | Status: DC
  Administered 2023-10-07 – 2023-10-09 (×3): 40 mg via ORAL
  Filled 2023-10-07 (×2): qty 1
  Filled 2023-10-07: qty 2

## 2023-10-07 MED ORDER — ZONISAMIDE 100 MG PO CAPS
100.0000 mg | ORAL_CAPSULE | Freq: Two times a day (BID) | ORAL | Status: DC
  Administered 2023-10-08 – 2023-10-09 (×3): 100 mg via ORAL
  Filled 2023-10-07 (×5): qty 1

## 2023-10-07 MED ORDER — PANTOPRAZOLE SODIUM 40 MG PO TBEC
40.0000 mg | DELAYED_RELEASE_TABLET | Freq: Two times a day (BID) | ORAL | Status: DC
  Administered 2023-10-07 – 2023-10-09 (×4): 40 mg via ORAL
  Filled 2023-10-07 (×4): qty 1

## 2023-10-07 MED ORDER — FUROSEMIDE 10 MG/ML IJ SOLN
80.0000 mg | INTRAMUSCULAR | Status: DC
  Filled 2023-10-07: qty 8

## 2023-10-07 MED ORDER — BACITRACIN ZINC 500 UNIT/GM EX OINT
TOPICAL_OINTMENT | Freq: Two times a day (BID) | CUTANEOUS | Status: DC
  Administered 2023-10-07: 1 via TOPICAL
  Administered 2023-10-08 – 2023-10-09 (×2): 31.5 via TOPICAL
  Filled 2023-10-07: qty 28.4
  Filled 2023-10-07: qty 1.8

## 2023-10-07 MED ORDER — ENOXAPARIN SODIUM 40 MG/0.4ML IJ SOSY
40.0000 mg | PREFILLED_SYRINGE | INTRAMUSCULAR | Status: DC
  Administered 2023-10-07: 40 mg via SUBCUTANEOUS
  Filled 2023-10-07: qty 0.4

## 2023-10-07 MED ORDER — ASPIRIN 81 MG PO TBEC
81.0000 mg | DELAYED_RELEASE_TABLET | Freq: Every day | ORAL | Status: DC
  Administered 2023-10-07 – 2023-10-09 (×3): 81 mg via ORAL
  Filled 2023-10-07 (×3): qty 1

## 2023-10-07 MED ORDER — ROSUVASTATIN CALCIUM 5 MG PO TABS
5.0000 mg | ORAL_TABLET | Freq: Every day | ORAL | Status: DC
  Administered 2023-10-07 – 2023-10-09 (×3): 5 mg via ORAL
  Filled 2023-10-07 (×3): qty 1

## 2023-10-07 MED ORDER — TRAZODONE HCL 50 MG PO TABS
25.0000 mg | ORAL_TABLET | Freq: Every evening | ORAL | Status: DC | PRN
  Filled 2023-10-07: qty 1

## 2023-10-07 MED ORDER — GABAPENTIN 300 MG PO CAPS
600.0000 mg | ORAL_CAPSULE | Freq: Three times a day (TID) | ORAL | Status: DC
  Administered 2023-10-07 – 2023-10-09 (×5): 600 mg via ORAL
  Filled 2023-10-07 (×5): qty 2

## 2023-10-07 MED ORDER — CARVEDILOL 3.125 MG PO TABS
3.1250 mg | ORAL_TABLET | ORAL | Status: AC
  Administered 2023-10-07: 3.125 mg via ORAL
  Filled 2023-10-07: qty 1

## 2023-10-07 MED ORDER — ACETAMINOPHEN 650 MG RE SUPP: 650.0000 mg | Freq: Four times a day (QID) | RECTAL | Status: DC | PRN

## 2023-10-07 MED ORDER — CARVEDILOL 3.125 MG PO TABS
3.1250 mg | ORAL_TABLET | Freq: Two times a day (BID) | ORAL | Status: DC
  Filled 2023-10-07: qty 1

## 2023-10-07 MED ORDER — ACETAMINOPHEN 325 MG PO TABS
650.0000 mg | ORAL_TABLET | Freq: Four times a day (QID) | ORAL | Status: DC | PRN
  Administered 2023-10-07: 650 mg via ORAL
  Filled 2023-10-07: qty 2

## 2023-10-07 NOTE — ED Provider Triage Note (Signed)
 Emergency Medicine Provider Triage Evaluation Note  [name] , a 73 y.o. female  was evaluated in triage.  Pt complains of ongoing shortness of breath and dizzy spells ongoing for the past couple of weeks since her stent placement on 08/24/23.  The shortness of breath and dizziness occur when she is going to stand up or starts walking around.  She also reports increased swelling in the legs bilaterally.  Denies any chest pain.  Review of Systems  Positive: As above Negative: As above  Physical Exam  BP (!) 180/81   Pulse 78   Temp 98.4 F (36.9 C) (Oral)   Resp 16   Wt 78.9 kg   SpO2 98%   BMI 28.08 kg/m  Gen:   Awake, no distress   Resp:  Normal effort  MSK:   Moves extremities without difficulty    Medical Decision Making  Medically screening exam initiated at 1:55 PM.  Appropriate orders placed.  MATTISEN POHLMANN was informed that the remainder of the evaluation will be completed by another provider, this initial triage assessment does not replace that evaluation, and the importance of remaining in the ED until their evaluation is complete.    Veta Palma, PA-C 10/07/23 1355

## 2023-10-07 NOTE — Assessment & Plan Note (Addendum)
 BP 186/84 on admission, but asymptomatic and labwork without evidence of end organ damage.  No concern for hypertensive emergency. - Restart home amlodipine  2.5, coreg  3.125, losartan  50 mg daily - Had stopped carvedilol  2 weeks ago due to concern for headaches, will monitor closely after restarting but not aware of common side effect of headaches with this medicine - Can consider adding PRN inpatient if BP is severe range persistently or patient becomes symptomatic, but otherwise do not favor close BP control iso asymptomatic hypertension inpatient - Consider discontinuing amlodipine  if concerned it is causing LE edema

## 2023-10-07 NOTE — H&P (Cosign Needed Addendum)
 Hospital Admission History and Physical Service Pager: [phone]  Patient name: [name] Medical record number: [id number] Date of Birth: 12/02/1950 Age: 73 y.o. Gender: female  Primary Care Provider: Dr. Lonnie @ [facility] Consultants: None Code Status: DNR/DNI, which was confirmed with patient at bedside Preferred Emergency Contact: Delon, daughter Contact Information     Name Relation Home Work [place] Son [phone]  [phone]   FRIMY, UFFELMAN Daughter [phone]  [phone]   Perley Salomon Burrows [phone]  [phone]      Other Contacts   None on File     Chief Complaint: Falls  Differential and Medical Decision Making:  [name] is a 73 y.o. female presenting with 3 falls in the last week in addition to shortness of breath and BLE edema.  Differential for this patient's falls includes:  -Orthostasis, dehydration: Primary differential, patient reports onset of these symptoms alongside recent hot weather and endorses profuse sweating with possible inadequate hydration.  Only endorses drinking 16 oz bottles x3 daily. -CHF exacerbation: Low concern given that patient appears euvolemic on exam with normal BNP, CXR, and without new oxygen requirement.  Do note BLE edema, but it is nonpitting and favor venous stasis or amlodipine  effect over CHF.  Will restart home oral diuretic and monitor closely. -Hypertensive emergency: Considered, however patient is asymptomatic except for dyspnea, which sounds more chronic on review of history and lab workup does not show evidence of end organ damage. -Low concern for PE/DVT given absence of tachycardia and new oxygen requirement while lower extremity swelling is symmetrical bilaterally; Wells score of 1. -Low concern for seizure given lack of post ictal state, no loss of bowel or bladder control, no seizure-like semiology. Assessment & Plan Frequent falls Frequent falls, dyspnea, and  weakness for past week.  No evidence of seizures, stroke, vasovagal syncope.  Hgb stable and not on blood thinners, just DAPT.  Suspect deconditioning and orthostatic hypotension 2/2 dehydration in setting of recent hot weather. - Admit to FMTS inpatient, med-tele level of care, attending Dr. Donzetta - Continuous cardiac monitoring - Orthostatic vital signs x1 - D-dimer x1 given Wells score of 1 - WOC consult for R leg abrasions - PT/OT eval and treat Hypertension BP 186/84 on admission, but asymptomatic and labwork without evidence of end organ damage.  No concern for hypertensive emergency. - Restart home amlodipine  2.5, coreg  3.125, losartan  50 mg daily - Had stopped carvedilol  2 weeks ago due to concern for headaches, will monitor closely after restarting but not aware of common side effect of headaches with this medicine - Can consider adding PRN inpatient if BP is severe range persistently or patient becomes symptomatic, but otherwise do not favor close BP control iso asymptomatic hypertension inpatient - Consider discontinuing amlodipine  if concerned it is causing LE edema Chronic diastolic heart failure (HCC) BNP WNL, CXR without pulmonary edema, saturating well on room air.  Does have bilateral LE edema. - Restart Lasix  40 mg PO (similar to home dose, takes daily or BID variably) - Daily weights and strict I&Os - Heart healthy diet - AM BMP and Mag Chronic health problem HLD: Continue home rosuvastatin  5 mg daily GERD: Continue pantoprazole  40 mg PO BID Hx cardiac stent: Continue DAPT (clopidogrel  + ASA) Insomnia: Continue home trazodone  25 mg at bedtime Chronic back pain: Continue home tramadol  50 BID PRN Migraines/neuralgia: Continue zonisamide  100 mg BID  FEN/GI: Heart healthy VTE Prophylaxis: Lovenox   Disposition: Med Tele  History of Present Illness:  [name] is a 73 y.o. female presenting with 1 week of recurrent falls, shortness of breath, fatigue, increased leg  swelling.  She denies loss of consciousness, head hit or other injury from these falls other than superficial lacs. She does get faint when she stands up suddenly but reports these falls primarily happened as she walking or turning, reported more of a dizzy feeling than weakness in her legs. Endorses PND once a night for the past year.  Sleeps with 2 pillows, has not recently changed this. She endorses wheezing but no chest pain, cough, sick symptoms. Has not started or stopped any medicines recently. She had a stent of LAD in late June and her daughter reports her symptoms are somewhat similar to what she was feeling prior to the stent. Denies N/V/D, hematochezia.  In the ED, she was hypertensive to 180/81 w/ otherwise stable VS. Workup available on admission includes EKG w/o ischemic changes and CXR w/ no acute cardiopulmonary process.  BMP significant for creatinine 1.14 which appears to be her baseline, CBC unremarkable, BNP 58.7, troponin 6, 6. Admitted to FMTS for concern for frequent falls.   Review Of Systems: Per HPI  Pertinent Past Medical History: - CKD following with Nephrology - GERD - HLD - HTN - Migraines - PE in 2017 - Stroke in 2015  Remainder reviewed in history tab.    Pertinent Past Surgical History: - Hysterectomy ~40 years ago - Cholecystectomy - Coronary stent LAD in August 25, 2023 - Pneumothorax surgical procedure - Elbow surgery - Bladder prolapse - Cervical spine fusion  Remainder reviewed in history tab.    Pertinent Social History: Tobacco use: Does not smoke; ~35-40 pack year history, quit ~2000 Alcohol use: None Other Substance use: None Lives alone at home, daughter visits occasionally and help but otherwise independent in ADLs  Pertinent Family History: 1st brother has a pacemaker 1st brother had Hodkins lymphoma 2nd rother front lobe dementia 2nd brother died of MI 3rd brother had T2DM Mother had breast cancer Biological father lost both  legs to cancer, had diabetes, and had some kind of heart conditions  Important Outpatient Medications: Has not taken any medicines today Current Outpatient Medications   Medication Instructions Taking?   albuterol  (VENTOLIN  HFA) 108 (90 Base) MCG/ACT inhaler 2 puffs, Inhalation, Every 6 hours PRN Using maybe once per month for allergies, no asthma Hx   amLODipine  (NORVASC ) 2.5 mg, Oral, Daily Taking 2.5 mg, documented as 5 mg, cannot take due to strange weakness   aspirin  EC 81 mg, Oral, Daily, Swallow whole Taking   BIOTIN PO 1 capsule, Daily Taking   CALCIUM  PO 1 tablet, Daily Taking   carvedilol  (COREG ) 3.125 mg, Oral, 2 times daily with meals Not taking since 2 weeks ago due to headaches   cetirizine  (ZYRTEC ) 10 mg, Oral, Daily with breakfast Taking daily   CHOLECALCIFEROL PO 1 tablet, Daily Taking   clopidogrel  (PLAVIX ) 75 mg, Oral, Daily Taking   fluticasone  (FLONASE ) 50 MCG/ACT nasal spray INSTILL 1 SPRAY INTO BOTH NOSTRILS DAILY Taking   furosemide  (LASIX ) 40 mg, Oral, Daily PRN, For weight gain greater than 3lbs in one day or 5lbs in a week. Taking 40 mg BID   gabapentin  (NEURONTIN ) 600 mg, Oral, 3 times daily Taking   inclisiran (LEQVIO ) 284 MG/1.5ML SOSY injection Inject as directed Taking every 6 months   losartan  (COZAAR ) 25 mg, Oral, Daily Taking    Nurtec 75 mg, Oral, Daily PRN Taking for headaches   ondansetron  (ZOFRAN )  4 mg, Oral, Every 8 hours PRN Takes rarely, maybe once a month with migraine   pantoprazole  (PROTONIX ) 40 mg, Oral, 2 times daily Taking BID for hiatal hernia and GERD   rosuvastatin  (CRESTOR ) 5 mg, Oral, Daily Taking   sertraline  (ZOLOFT ) 100 mg, Oral, Daily Taking   tiZANidine  (ZANAFLEX ) 4 mg, Oral, Every 6 hours PRN Taking BID   traMADol  (ULTRAM ) 50 MG tablet TAKE 1 TABLET EVERY 8 HOURS AS NEEDED FOR MODERATE PAIN Taking BID or daily depending on back pain   traZODone  (DESYREL ) 25 mg, Oral, At bedtime PRN, for sleep Taking 25 mg nightly   zonisamide   (ZONEGRAN ) 100 mg, Oral, 2 times daily Taking     Objective: BP (!) 186/84   Pulse 81   Temp 98.3 F (36.8 C) (Oral)   Resp 18   Wt 78.9 kg   SpO2 100%   BMI 28.08 kg/m   Exam: General: Awake, alert, NAD. Communicates clearly. Cardio: RRR. Normal S1, S2. No murmur, rub, gallop. 2+ radial and dorsalis pedis pulses b/l w/ good capillary refill. Non pitting LE swelling b/l. No JVD appreciated.  Resp: CTA bilaterally. No wheezes, rales, or rhonchi. Normal work of breathing on room air.  Abdomen: soft, non-tender, non-distended. Normoactive BS auscultated. No guarding, rigidity, or rebound. Extremities: Bilateral 1+ nonpitting edema, symmetric. Capillary refill 2 seconds. Skin: Scattered ecchymoses and few fresh bleeding abrasions on arms and legs (R leg photo below). Neuro: No focal neurological deficits.    Labs:  CBC BMET  Recent Labs  Lab 10/07/23 1403  WBC 6.1  HGB 14.0  HCT 43.4  PLT 239   Recent Labs  Lab 10/07/23 1403  NA 140  K 3.9  CL 107  CO2 23  BUN 14  CREATININE 1.14*  GLUCOSE 153*  CALCIUM  8.9    BNP 58.7, troponin 6, 6.   EKG:  EKG: NSR  Imaging Studies Performed:  CXR Impression from Radiologist: No active cardiopulmonary disease. My Interpretation: Normal cardiac silhouette, no pneumothorax or consolidation.  No pulmonary congestion or increased interstitial markings appreciated.   Manon Jester, DO 10/07/2023, 6:09 PM PGY-1, [facility]  FPTS Intern pager: [phone], text pages welcome Secure chat group [facility] Teaching Service   FMTS Upper-Level Resident Attestation I have independently interviewed and examined the patient. I have discussed the above with Dr. Jester and agree with the documented plan. My edits for correction/addition/clarification are included above. Please see any attending notes.  Gopal Malter Toma, MD PGY-2, [facility] 10/07/2023 9:06 PM FPTS Service pager:  [phone] (text pages welcome through [employer])

## 2023-10-07 NOTE — Assessment & Plan Note (Signed)
 Concerned for CHF excerebration given patients new onset swelling and lung findings. Given recent falls and dizziness, recommend patient go to ED for further evaluation. Offered EMS and carelink as patient drove herself, but she declined. She would like to wait for personal transport. Given stability and no current chest pain, feel this is reasonable.

## 2023-10-07 NOTE — Plan of Care (Signed)
 FMTS Interim Progress Note  Patient assessed at bedside as part of night rounds. Daughter at bedside and provided additional history.  Denies any chest pain currently. Reports she is developing a headache.  BP high at bedside. Patient visibly grimacing with cuff tightening due to discomfort, likely false elevated due to muscle tension from pain. Patient reports home BP can be in SBP 150s.  Daughter asked if referral to HF clinic may be warranted.  Seen by cardiology but felt they would focus more on her hypertension then volume component.  O: BP (!) 175/77   Pulse 78   Temp 98.3 F (36.8 C) (Oral)   Resp 18   Wt 78.9 kg   SpO2 98%   BMI 28.08 kg/m    General: Ambulated to the bathroom without difficulty.  NAD CV: RRR without murmur RESP: Normal work of breathing on room air.  CTAB. EXT: 1+ bilateral nonpitting edema. Skin: Superficial abrasion x 2 of right lower extremity and of left upper extremity x 1.  No surrounding erythema or purulence.   A/P: Peripheral edema HTN Work up less concerning for overt HF exacerbation but some peripheral edema noted upon exam. Possible medication induced from Amlodipine  (although only on 2.5mg  daily) vs venous stasis.  Echo from 08/2023 reassuringly normal EF and G1DD.  Continues to be hypertensive inpatient, likely in the setting of pain from BP cuff and stress from admission.  Will continue to monitor and optimize medications. - D/C amlodipine  - Monitor BP, may need to increase of home hypertension medications but per review of last cardiology note has had some episodes of hypotension - Monitor diuresis with Lasix , may benefit from increased dosing and assess for improvement in symptoms - Consider addition of Jardiance to GDMT  Falls Wound care consult pending, ordered topical bacitracin  and dressings in the interim.  Remainder of plan per day team.  Orders reviewed and updated as needed.  Theophilus Pagan, MD 10/07/2023, 9:32 PM PGY-3,  [facility] Family [facility] Service pager [phone]

## 2023-10-07 NOTE — ED Notes (Signed)
Pt ambulated to the restroom without assistance

## 2023-10-07 NOTE — Patient Instructions (Signed)
 It was wonderful to see you today.  Please bring ALL of your medications with you to every visit.   Today we talked about:  I am concerned about your swelling. I am sending you over to the emergency room for further work up.   Thank you for choosing [facility].   Please call [phone] with any questions about today's appointment.  Please arrive at least 15 minutes prior to your scheduled appointments.   If you had blood work today, I will send you a MyChart message or a letter if results are normal. Otherwise, I will give you a call.   If you had a referral placed, they will call you to set up an appointment. Please give us  a call if you don't hear back in the next 2 weeks.   If you need additional refills before your next appointment, please call your pharmacy first.   Do you need your medications delivered to your home?   We'll send your prescription to the [facility] for delivery.          Address: [address] [place], [place], [place] [address]          Phone: [phone]  Please call the [facility] to speak with a pharmacist and set up your home medication delivery. If you have any questions, feel free to contact us  -- we're happy to help!  Other [facility] that offer affordable prices on both prescriptions and over-the-counter items, as well as convenient services like vaccinations, are  [facility], at [facility]         Address:  [address], [place], [place] [address]         Phone: [phone]  [facility], located in the [facility]        Address: [address] [place], [place] [address]        Phone: [phone]  [facility], at [facility]       Address: [address], [place], [place] [address]       Phone: [phone]  [facility], at [facility]       Address: [address] Floor, [place], [place] [address]       Phone: [phone]  You should follow up in our clinic in No follow-ups on file.  Gloriann Ogren, MD Family Medicine

## 2023-10-07 NOTE — ED Notes (Signed)
 Hospitalist at the bedside

## 2023-10-07 NOTE — Progress Notes (Signed)
    SUBJECTIVE:   CHIEF COMPLAINT / HPI:   Patient is a 73 yo F with PMHx of HTN, CHF, CKD III, CVA, CAD with recent stent placement (08/2023) who presents for new onset leg swelling, dizziness and shortness of breath along with three falls in the past two weeks. Recently adjusted patients BP medications as she was having lower Bps at home. Over the past two weeks, patient has noticed increased bilateral leg swelling, difficulty breathing and dizziness. Denies any chest pain. Denies any respiratory symptoms. States she just overall does not feel well and feels run down. Denies hitting her head when she fell. She is currently on plavix .   OBJECTIVE:   BP (!) 154/78   Pulse 88   Ht 5' 6 (1.676 m)   Wt 174 lb (78.9 kg)   SpO2 97%   BMI 28.08 kg/m   General: A&O, anxious and tearful  HEENT: No sign of trauma, EOM grossly intact Cardiac: RRR, no m/r/g Respiratory: mild crackles throughout bilateral bases, no wheezing heard  GI: Soft, NTTP, non-distended  Extremities: bilateral 1+ pitting lower extremity edema, bruising to right lower shin and lateral leg    ASSESSMENT/PLAN:   Assessment & Plan Chronic diastolic heart failure (HCC) Concerned for CHF excerebration given patients new onset swelling and lung findings. Given recent falls and dizziness, recommend patient go to ED for further evaluation. Offered EMS and carelink as patient drove herself, but she declined. She would like to wait for personal transport. Given stability and no current chest pain, feel this is reasonable.      Gloriann Ogren, MD [facility]

## 2023-10-07 NOTE — Assessment & Plan Note (Addendum)
 HLD: Continue home rosuvastatin  5 mg daily GERD: Continue pantoprazole  40 mg PO BID Hx cardiac stent: Continue DAPT (clopidogrel  + ASA) Insomnia: Continue home trazodone  25 mg at bedtime Chronic back pain: Continue home tramadol  50 BID PRN Migraines/neuralgia: Continue zonisamide  100 mg BID

## 2023-10-07 NOTE — ED Provider Notes (Signed)
 [place] EMERGENCY DEPARTMENT AT [facility] Provider Note   CSN: [id number] Arrival date & time: 10/07/23  1235     Patient presents with: Dizziness   MICHAELLA IMAI is a 73 y.o. female.   73 year old female with a history of CHF, hypertension, CAD s/p PCI, HLD, and PE on Eliquis  who presents to the emergency department with shortness of breath, leg swelling, and dizziness.  Patient reports that for the past week and a half she has been having the above symptoms.  Says that she is also feeling generally fatigued.  No chest pain.  Says that shortness of breath is worsened with exertion laying flat.  Also says that her legs have been more swollen despite taking the Lasix  80 mg twice daily.  Thinks she has gained some weight.  Was recently admitted and had stents placed for proximal LAD occlusion.  Went to her outpatient doctor today who referred her to the emergency department for evaluation. Has been compliant with her eliquis .        Prior to Admission medications   Medication Sig Start Date End Date Taking? Authorizing Provider  albuterol  (VENTOLIN  HFA) 108 (90 Base) MCG/ACT inhaler Inhale 2 puffs into the lungs every 6 (six) hours as needed for wheezing or shortness of breath. 01/24/20  Yes Hunsucker, Donnice JONELLE, MD  amLODipine  (NORVASC ) 5 MG tablet TAKE 1 TABLET (5 MG TOTAL) BY MOUTH DAILY. 06/02/23 05/27/24 Yes Raford Riggs, MD  aspirin  EC 81 MG tablet Take 1 tablet (81 mg total) by mouth daily. Swallow whole. 08/26/23  Yes Alba Sharper, MD  BIOTIN PO Take 1 capsule by mouth daily. Dosage unknown   Yes [provider]  CALCIUM  PO Take 1 tablet by mouth daily. Dosage unknown   Yes [provider]  carvedilol  (COREG ) 3.125 MG tablet Take 1 tablet (3.125 mg total) by mouth 2 (two) times daily with a meal. 09/15/23  Yes Donah Laymon PARAS, MD  cetirizine  (ZYRTEC ) 10 MG tablet Take 1 tablet (10 mg total) by mouth daily with breakfast. 01/15/22 08/23/24 Yes  Rosendo Rush, MD  CHOLECALCIFEROL PO Take 1 tablet by mouth daily. Dosage unknown   Yes [provider]  clopidogrel  (PLAVIX ) 75 MG tablet TAKE 1 TABLET BY MOUTH EVERY DAY 06/02/23  Yes Raford Riggs, MD  fluticasone  (FLONASE ) 50 MCG/ACT nasal spray INSTILL 1 SPRAY INTO BOTH NOSTRILS DAILY 08/07/23  Yes Joshua Domino, DO  furosemide  (LASIX ) 40 MG tablet Take 1 tablet (40 mg total) by mouth daily as needed. For weight gain greater than 3lbs in one day or 5lbs in a week. Patient taking differently: Take 40 mg by mouth 2 (two) times daily. For weight gain greater than 3lbs in one day or 5lbs in a week. 08/26/23  Yes Alba Sharper, MD  gabapentin  (NEURONTIN ) 600 MG tablet TAKE 1 TABLET BY MOUTH THREE TIMES A DAY 06/01/23  Yes Kirsteins, Prentice BRAVO, MD  inclisiran (LEQVIO ) 284 MG/1.5ML SOSY injection Inject as directed 10/04/21  Yes Raford Riggs, MD  losartan  (COZAAR ) 25 MG tablet Take 1 tablet (25 mg total) by mouth daily. 10/02/23 12/31/23 Yes Baloch, Mahnoor, MD  ondansetron  (ZOFRAN ) 4 MG tablet Take 1 tablet (4 mg total) by mouth every 8 (eight) hours as needed for nausea or vomiting. 05/24/20  Yes Burnetta Aures, MD  pantoprazole  (PROTONIX ) 40 MG tablet TAKE 1 TABLET BY MOUTH TWICE A DAY 08/07/23  Yes Joshua Domino, DO  Rimegepant Sulfate (NURTEC) 75 MG TBDP Take 1 tablet (75 mg total)  by mouth daily as needed (take for abortive therapy of migraine, no more than 1 tablet in 24 hours or 10 per month). 06/03/23  Yes Lomax, Amy, NP  rosuvastatin  (CRESTOR ) 5 MG tablet TAKE 1 TABLET (5 MG TOTAL) BY MOUTH DAILY. 06/02/23 05/27/24 Yes Raford Riggs, MD  sertraline  (ZOLOFT ) 100 MG tablet TAKE 1 TABLET BY MOUTH EVERY DAY 05/01/23  Yes Joshua Domino, DO  tiZANidine  (ZANAFLEX ) 4 MG tablet Take 1 tablet (4 mg total) by mouth every 6 (six) hours as needed for muscle spasms. 07/09/23  Yes Lomax, Amy, NP  traMADol  (ULTRAM ) 50 MG tablet TAKE 1 TABLET EVERY 8 HOURS AS NEEDED FOR MODERATE PAIN 08/14/23  Yes  Kirsteins, Prentice BRAVO, MD  traZODone  (DESYREL ) 50 MG tablet TAKE 1/2 TO 1 TABLET BY MOUTH AT BEDTIME AS NEEDED FOR SLEEP 08/04/23  Yes Joshua Domino, DO  zonisamide  (ZONEGRAN ) 100 MG capsule Take 1 capsule (100 mg total) by mouth 2 (two) times daily. 06/03/23  Yes Lomax, Amy, NP    Allergies: Shellfish allergy, Crestor  [rosuvastatin ], Lipitor [atorvastatin ], Statins, and Iodine    Review of Systems  Updated Vital Signs BP (!) 155/77   Pulse 77   Temp 98.2 F (36.8 C)   Resp 17   Wt 78.9 kg   SpO2 100%   BMI 28.08 kg/m   Physical Exam Vitals and nursing note reviewed.  Constitutional:      General: She is not in acute distress.    Appearance: She is well-developed.  HENT:     Head: Normocephalic and atraumatic.     Right Ear: External ear normal.     Left Ear: External ear normal.     Nose: Nose normal.  Eyes:     Extraocular Movements: Extraocular movements intact.     Conjunctiva/sclera: Conjunctivae normal.     Pupils: Pupils are equal, round, and reactive to light.  Cardiovascular:     Rate and Rhythm: Normal rate and regular rhythm.     Heart sounds: No murmur heard. Pulmonary:     Effort: Pulmonary effort is normal. No respiratory distress.     Breath sounds: Rales (bibasilar) present.  Musculoskeletal:     Cervical back: Normal range of motion and neck supple.     Right lower leg: Edema present.     Left lower leg: Edema present.  Skin:    General: Skin is warm and dry.  Neurological:     Mental Status: She is alert and oriented to person, place, and time. Mental status is at baseline.  Psychiatric:        Mood and Affect: Mood normal.     (all labs ordered are listed, but only abnormal results are displayed) Labs Reviewed  CBC WITH DIFFERENTIAL/PLATELET - Abnormal; Notable for the following components:      Result Value   Lymphs Abs 0.5 (*)    Monocytes Absolute 0.0 (*)    All other components within normal limits  COMPREHENSIVE METABOLIC PANEL WITH GFR -  Abnormal; Notable for the following components:   Glucose, Bld 153 (*)    Creatinine, Ser 1.14 (*)    GFR, Estimated 51 (*)    All other components within normal limits  BRAIN NATRIURETIC PEPTIDE  MAGNESIUM   BASIC METABOLIC PANEL WITH GFR  MAGNESIUM   D-DIMER, QUANTITATIVE  TROPONIN I (HIGH SENSITIVITY)  TROPONIN I (HIGH SENSITIVITY)    EKG: EKG Interpretation Date/Time:  Wednesday October 07 2023 13:28:20 EDT Ventricular Rate:  75 PR Interval:  144 QRS Duration:  80 QT Interval:  408 QTC Calculation: 455 R Axis:   68  Text Interpretation: Normal sinus rhythm Cannot rule out Anterior infarct , age undetermined Abnormal ECG Confirmed by Yolande Charleston [phone]) on 10/07/2023 4:20:12 PM  Radiology: DG Chest 2 View Result Date: 10/07/2023 CLINICAL DATA:  Shortness of breath. EXAM: CHEST - 2 VIEW COMPARISON:  08/24/2023. FINDINGS: Bilateral lung fields are clear. Bilateral costophrenic angles are clear. Normal cardio-mediastinal silhouette. No acute osseous abnormalities. Lower cervical spinal fixation hardware noted. The soft tissues are within normal limits. IMPRESSION: No active cardiopulmonary disease. Electronically Signed   By: Ree Molt M.D.   On: 10/07/2023 13:51     Procedures   Medications Ordered in the ED  aspirin  EC tablet 81 mg (81 mg Oral Given 10/07/23 1853)  traMADol  (ULTRAM ) tablet 50 mg (has no administration in time range)  carvedilol  (COREG ) tablet 3.125 mg (has no administration in time range)  losartan  (COZAAR ) tablet 25 mg (has no administration in time range)  rosuvastatin  (CRESTOR ) tablet 5 mg (5 mg Oral Given 10/07/23 1853)  sertraline  (ZOLOFT ) tablet 100 mg (100 mg Oral Given 10/07/23 1852)  traZODone  (DESYREL ) tablet 25 mg (has no administration in time range)  pantoprazole  (PROTONIX ) EC tablet 40 mg (40 mg Oral Given 10/07/23 2240)  clopidogrel  (PLAVIX ) tablet 75 mg (75 mg Oral Given 10/07/23 1853)  gabapentin  (NEURONTIN ) capsule 600 mg (600 mg Oral  Given 10/07/23 1938)  zonisamide  (ZONEGRAN ) capsule 100 mg (0 mg Oral Hold 10/07/23 2240)  enoxaparin  (LOVENOX ) injection 40 mg (40 mg Subcutaneous Given 10/07/23 1853)  acetaminophen  (TYLENOL ) tablet 650 mg (650 mg Oral Given 10/07/23 2058)    Or  acetaminophen  (TYLENOL ) suppository 650 mg ( Rectal See Alternative 10/07/23 2058)  furosemide  (LASIX ) tablet 40 mg (40 mg Oral Given 10/07/23 1938)  bacitracin  ointment (1 Application Topical Given 10/07/23 2240)  carvedilol  (COREG ) tablet 3.125 mg (3.125 mg Oral Given 10/07/23 1654)  losartan  (COZAAR ) tablet 50 mg (50 mg Oral Given 10/07/23 1654)    Clinical Course as of 10/07/23 2309  Wed Oct 07, 2023  1641 Dw Dr [place] from cardiology. Does not feel that any acute intervention is required.  Recommending outpatient follow-up. [RP]  1715 Discussed with family medicine for admission. [RP]    Clinical Course User Index [RP] Yolande Charleston BROCKS, MD                                 Medical Decision Making Risk Prescription drug management. Decision regarding hospitalization.   73 year old female with a history of CHF, hypertension, CAD s/p PCI, HLD, and PE on Eliquis  who presents to the emergency department with shortness of breath, leg swelling, and dizziness.    Initial Ddx:  Hypertensive emergency, CHF, MI, PE, COPD  MDM/Course:  Patient presents to the emergency department with shortness of breath, leg swelling, and dizziness.  No significant chest discomfort.  On exam is not in acute distress.  Is hypertensive.  Does have some lower extremity edema.  Chest x-ray without significant pulmonary edema.  Initial troponin and BNP WNL.  Upon re-evaluation patient very anxious and says that she is very concerned because her swelling has not been going down even despite taking Lasix  80 mg twice daily.  She was given her home blood pressure medication to have been some improvement of her blood pressure.  Also was given Lasix  for diuresis.  With recent  catheterization and request from patient  discussed with cardiology who felt that no acute intervention was required and she can follow-up with them as an outpatient.  Admitted to hospitalist for blood pressure control and diuresis.  This patient presents to the ED for concern of complaints listed in HPI, this involves an extensive number of treatment options, and is a complaint that carries with it a high risk of complications and morbidity. Disposition including potential need for admission considered.   Dispo: Admit to Floor  Additional history obtained from daughter Records reviewed Outpatient Clinic Notes The following labs were independently interpreted: Chemistry and show no acute abnormality I independently reviewed the following imaging with scope of interpretation limited to determining acute life threatening conditions related to emergency care: Chest x-ray and agree with the radiologist interpretation with the following exceptions: none I personally reviewed and interpreted cardiac monitoring: normal sinus rhythm  I personally reviewed and interpreted the pt's EKG: see above for interpretation  I have reviewed the patients home medications and made adjustments as needed Consults: Cardiology and Hospitalist Social Determinants of health:  Geriatric  Portions of this note were generated with Scientist, clinical (histocompatibility and immunogenetics). Dictation errors may occur despite best attempts at proofreading.     Final diagnoses:  Lower extremity edema  Hypertensive urgency    ED Discharge Orders     None          Yolande Lamar BROCKS, MD 10/07/23 2309

## 2023-10-07 NOTE — ED Notes (Signed)
 Patient transported to X-ray

## 2023-10-07 NOTE — Assessment & Plan Note (Addendum)
 Frequent falls, dyspnea, and weakness for past week.  No evidence of seizures, stroke, vasovagal syncope.  Hgb stable and not on blood thinners, just DAPT.  Suspect deconditioning and orthostatic hypotension 2/2 dehydration in setting of recent hot weather. - Admit to FMTS inpatient, med-tele level of care, attending Dr. Donzetta - Continuous cardiac monitoring - Orthostatic vital signs x1 - D-dimer x1 given Wells score of 1 - WOC consult for R leg abrasions - PT/OT eval and treat

## 2023-10-07 NOTE — Assessment & Plan Note (Addendum)
 BNP WNL, CXR without pulmonary edema, saturating well on room air.  Does have bilateral LE edema. - Restart Lasix  40 mg PO (similar to home dose, takes daily or BID variably) - Daily weights and strict I&Os - Heart healthy diet - AM BMP and Mag

## 2023-10-07 NOTE — ED Triage Notes (Signed)
 POV/ brought in by daughter/ sent by PCP/ pt c/o SHOB, dizziness, swelling in bil legs, multiple falls d/t dizziness/ cardiac stent placed on 08/24/23- pt states issues since stent/ pt A&OX4

## 2023-10-08 ENCOUNTER — Observation Stay (HOSPITAL_COMMUNITY)

## 2023-10-08 ENCOUNTER — Inpatient Hospital Stay (HOSPITAL_COMMUNITY)

## 2023-10-08 DIAGNOSIS — M7989 Other specified soft tissue disorders: Secondary | ICD-10-CM

## 2023-10-08 DIAGNOSIS — R42 Dizziness and giddiness: Secondary | ICD-10-CM | POA: Diagnosis not present

## 2023-10-08 DIAGNOSIS — R296 Repeated falls: Secondary | ICD-10-CM | POA: Diagnosis not present

## 2023-10-08 DIAGNOSIS — R29898 Other symptoms and signs involving the musculoskeletal system: Secondary | ICD-10-CM

## 2023-10-08 DIAGNOSIS — Z0389 Encounter for observation for other suspected diseases and conditions ruled out: Secondary | ICD-10-CM | POA: Diagnosis not present

## 2023-10-08 LAB — D-DIMER, QUANTITATIVE: D-Dimer, Quant: 1.22 ug{FEU}/mL — ABNORMAL HIGH (ref 0.00–0.50)

## 2023-10-08 LAB — BASIC METABOLIC PANEL WITH GFR
Anion gap: 8 (ref 5–15)
BUN: 18 mg/dL (ref 8–23)
CO2: 19 mmol/L — ABNORMAL LOW (ref 22–32)
Calcium: 8.8 mg/dL — ABNORMAL LOW (ref 8.9–10.3)
Chloride: 110 mmol/L (ref 98–111)
Creatinine, Ser: 1.16 mg/dL — ABNORMAL HIGH (ref 0.44–1.00)
GFR, Estimated: 50 mL/min — ABNORMAL LOW (ref >60)
Glucose, Bld: 155 mg/dL — ABNORMAL HIGH (ref 70–99)
Potassium: 3.8 mmol/L (ref 3.5–5.1)
Sodium: 137 mmol/L (ref 135–145)

## 2023-10-08 LAB — MAGNESIUM: Magnesium: 2 mg/dL (ref 1.7–2.4)

## 2023-10-08 MED ORDER — TECHNETIUM TO 99M ALBUMIN AGGREGATED
4.3000 | Freq: Once | INTRAVENOUS | Status: AC | PRN
  Administered 2023-10-08: 4.3 via INTRAVENOUS

## 2023-10-08 MED ORDER — ENOXAPARIN SODIUM 40 MG/0.4ML IJ SOSY
40.0000 mg | PREFILLED_SYRINGE | INTRAMUSCULAR | Status: DC
  Administered 2023-10-08: 40 mg via SUBCUTANEOUS
  Filled 2023-10-08: qty 0.4

## 2023-10-08 NOTE — Progress Notes (Signed)
 Daily Progress Note Intern Pager: [phone]  Patient name: [name] Medical record number: [id number] Date of birth: [birth year] Age: 73 y.o. Gender: female  Primary Care Provider: Lonnie Earnest, MD Consultants: None Code Status: DNR-I  Pt Overview and Major Events to Date:  7/31-Admitted  Medical Decision Making: 73 yo female w/ 3 falls in the last week w/ concomitant fatigue, dyspnea and LE swelling. Primary concern was for orthostasis, dehydration but also possibility for CHF exacerbation and/or DVT/PE. D dimer positive 7/30pm prompting LE US  and CTAPE. Also reported to attending that she had hit her head, thus ordering CT head w/o. Pertinent hx includes HTN, HLD, PE, prior CVA, CKDII and Migraines.   Assessment & Plan Frequent falls Frequent falls, dyspnea, and weakness for past week.  No evidence of seizures, stroke, vasovagal syncope.  Hgb stable and not on blood thinners, just DAPT.  Suspect deconditioning and dehydration in setting of recent hot weather, orthostatic vitals normal 7/31am. Positive D dimer, pending DVT scan and CTAPE.  - Given positive D-dimer proceed with lower extremity ultrasound and CTA PE. - Admit to FMTS inpatient, med-tele level of care, attending Dr. Donzetta - Continuous cardiac monitoring - WOC consult for R leg abrasions - PT/OT eval and treat -AM CBC Hypertension BP 186/84 on admission, but asymptomatic and labwork without evidence of end organ damage.  No concern for hypertensive emergency. BP 160s/90s today.  - Continue home losartan  50 mg daily - Hold home Coreg  and amlodipine  Chronic diastolic heart failure (HCC) BNP WNL, CXR without pulmonary edema, saturating well on room air.  Lower extremity edema improved this morning, could be attributable to either effective diuresis or patient elevating legs overnight. - Continue Lasix  40 mg PO (similar to home dose, takes daily or BID variably) - Daily weights and strict I&Os - Heart healthy  diet - AM BMP Chronic health problem HLD: Continue home rosuvastatin  5 mg daily GERD: Continue pantoprazole  40 mg PO BID Hx cardiac stent: Continue DAPT (clopidogrel  + ASA) Insomnia: Continue home trazodone  25 mg at bedtime Chronic back pain: Continue home tramadol  50 BID PRN Migraines/neuralgia: Continue zonisamide  100 mg BID   FEN/GI: Heart healthy PPx: Lovenox  Dispo:Home tomorrow. Barriers include PE workup.   Subjective:  No acute events overnight, patient just arrived on floor and is tired from the night.  She reports improved lower extremity swelling with continued ease of breathing on room air.  Objective: Temp:  [98 F (36.7 C)-98.4 F (36.9 C)] 98 F (36.7 C) (07/31 0733) Pulse Rate:  [68-88] 80 (07/31 0534) Resp:  [10-21] 19 (07/31 0534) BP: (152-194)/(72-95) 161/85 (07/31 0534) SpO2:  [97 %-100 %] 99 % (07/31 0534) Weight:  [78.9 kg] 78.9 kg (07/30 1323) Physical Exam: General: Awake, alert, NAD. Communicates clearly. Cardio: RRR. Normal S1, S2. No murmur, rub, gallop. 2+ radial and dorsalis pedis pulses b/l w/ good capillary refill. Improved LE swelling b/l this AM.  Resp: CTA bilaterally. No wheezes, rales, or rhonchi. Normal work of breathing on room air   Laboratory: Most recent CBC Lab Results  Component Value Date   WBC 6.1 10/07/2023   HGB 14.0 10/07/2023   HCT 43.4 10/07/2023   MCV 89.1 10/07/2023   PLT 239 10/07/2023   Most recent BMP    Latest Ref Rng & Units 10/08/2023    1:13 AM  BMP  Glucose 70 - 99 mg/dL 844   BUN 8 - 23 mg/dL 18   Creatinine 9.55 - 1.00 mg/dL 8.83  Sodium 135 - 145 mmol/L 137   Potassium 3.5 - 5.1 mmol/L 3.8   Chloride 98 - 111 mmol/L 110   CO2 22 - 32 mmol/L 19   Calcium  8.9 - 10.3 mg/dL 8.8   D dimer 8.77  Imaging/Diagnostic Tests: None completed  Manon Jester, DO 10/08/2023, 7:41 AM  PGY-1, [facility] FPTS Intern pager: [phone], text pages welcome Secure chat group [facility] Teaching Service

## 2023-10-08 NOTE — Progress Notes (Signed)
 PT Cancellation Note  Patient Details Name: [name] MRN: [id number] DOB: 01-28-1951   Cancelled Treatment:    Reason Eval/Treat Not Completed: OT screened, no needs identified, will sign off. Per OT pt is ind will sign off at this time.   Dorothyann Maier, DPT, CLT  Acute Rehabilitation Services Office: [phone] (Secure chat preferred)   [name] 10/08/2023, 11:04 AM

## 2023-10-08 NOTE — Progress Notes (Signed)
 Transition of Care [facility]) - Inpatient Brief Assessment   Patient Details  Name: DANYAL ADORNO MRN: [id number] Date of Birth: [birth year]  [facility] of [facility] CM/SW Contact:    Rosaline JONELLE Joe, RN Phone Number: 10/08/2023, 2:07 PM   Clinical Narrative: IP CM met with the patient at the bedside and [facility] letter completed and provided copy to the patient.  Code 44 completed at the bedside.  Patient admitted to the hospital from home with hypertension and frequent falls.  Patient has RW, Cane, bath seat, commode at the home.  Patient is normally independent and plans to return home when stable for discharge.  No IP Care management needs at this time.   Transition of Care Asessment: Insurance and Status: (P) Insurance coverage has been reviewed Patient has primary care physician: (P) Yes Home environment has been reviewed: (P) from home alone Prior level of function:: (P) Independent Prior/Current Home Services: (P) No current home services Social Drivers of Health Review: (P) SDOH reviewed interventions complete Readmission risk has been reviewed: (P) Yes Transition of care needs: (P) transition of care needs identified, TOC will continue to follow

## 2023-10-08 NOTE — Care Management CC44 (Cosign Needed)
 Condition Code 44 Documentation Completed  Patient Details  Name: DULCEY RIEDERER MRN: [id number] Date of Birth: [birth year]   Condition Code 44 given:  Yes Patient signature on Condition Code 44 notice:  Yes Documentation of 2 MD's agreement:  Yes Code 44 added to claim:  Yes    Rosaline JONELLE Joe, RN 10/08/2023, 2:05 PM

## 2023-10-08 NOTE — Evaluation (Signed)
 Occupational Therapy Evaluation Patient Details Name: [name] MRN: [id number] DOB: August 20, 1950 Today's Date: 10/08/2023   History of Present Illness   [name] is a 45 female who presented due to BLE swelling, SOB and several recent falls. PMH includes: CHF, anxiety, UTI, pulmonary embolism, pelvic fxs, insomina, OA, HTN, TIA, CVA, severe multivessel coronary calcifications on CT chest     Clinical Impressions [name] was evaluated s/p the above admission list. She lives alone and is indep at baseline. Upon evaluation, pt demonstrated indep ability to complete mobility and ADLs. No report of dizziness throughout session. BLE edema resolved. Pt does not require further acute, or follow up OT services. Recommend discharge back to pt's environment with assist as needed. OT to sign off with appreciation of order, please re-consult if needed.       If plan is discharge home, recommend the following:   Assistance with cooking/housework     Functional Status Assessment   Patient has had a recent decline in their functional status and demonstrates the ability to make significant improvements in function in a reasonable and predictable amount of time.     Equipment Recommendations   None recommended by OT      Precautions/Restrictions   Precautions Precautions: Fall (low) Recall of Precautions/Restrictions: Intact Restrictions Weight Bearing Restrictions Per Provider Order: No     Mobility Bed Mobility Overal bed mobility: Independent                  Transfers Overall transfer level: Independent                        Balance Overall balance assessment: No apparent balance deficits (not formally assessed)             ADL either performed or assessed with clinical judgement   ADL Overall ADL's : Independent;At baseline           General ADL Comments: no AD/DME     Vision Baseline Vision/History: 0 No visual  deficits Vision Assessment?: No apparent visual deficits     Perception Perception: Within Functional Limits       Praxis Praxis: WFL       Pertinent Vitals/Pain Pain Assessment Pain Assessment: No/denies pain     Extremity/Trunk Assessment Upper Extremity Assessment Upper Extremity Assessment: Overall WFL for tasks assessed   Lower Extremity Assessment Lower Extremity Assessment: Overall WFL for tasks assessed   Cervical / Trunk Assessment Cervical / Trunk Assessment: Normal   Communication Communication Communication: No apparent difficulties   Cognition Arousal: Alert Behavior During Therapy: WFL for tasks assessed/performed Cognition: No apparent impairments                               Following commands: Intact       Cueing  General Comments   Cueing Techniques: Verbal cues  VSS, no report of dizziness    Home Living Family/patient expects to be discharged to:: Private residence Living Arrangements: Alone Available Help at Discharge: Family Type of Home: House Home Access: Stairs to enter Secretary/administrator of Steps: 3 Entrance Stairs-Rails: Right Home Layout: One level     Bathroom Shower/Tub: Chief Strategy Officer: Handicapped height     Home Equipment: Agricultural consultant (2 wheels);Shower seat;Grab bars - tub/shower;Toilet riser;Cane - single point          Prior Functioning/Environment Prior Level of Function :  Independent/Modified Independent;History of Falls (last six months)             Mobility Comments: no AD, recent falls x3 ADLs Comments: Independent with ADLs, cooking, cleaning, and driving.    OT Problem List: Impaired balance (sitting and/or standing)        OT Goals(Current goals can be found in the care plan section)   Acute Rehab OT Goals Patient Stated Goal: home OT Goal Formulation: With patient Time For Goal Achievement: 10/08/23 Potential to Achieve Goals: Good   AM-PAC OT 6  Clicks Daily Activity     Outcome Measure Help from another person eating meals?: None Help from another person taking care of personal grooming?: None Help from another person toileting, which includes using toliet, bedpan, or urinal?: None Help from another person bathing (including washing, rinsing, drying)?: None Help from another person to put on and taking off regular upper body clothing?: None Help from another person to put on and taking off regular lower body clothing?: None 6 Click Score: 24   End of Session Nurse Communication: Mobility status  Activity Tolerance: Patient tolerated treatment well Patient left: in bed;with family/visitor present  OT Visit Diagnosis: Repeated falls (R29.6)                Time: 8982-8968 OT Time Calculation (min): 14 min Charges:  OT General Charges $OT Visit: 1 Visit OT Evaluation $OT Eval Low Complexity: 1 Low  Lucie Kendall, OTR/L Acute Rehabilitation Services Office [phone] Secure Chat Communication Preferred   Lucie JONETTA Kendall 10/08/2023, 10:43 AM

## 2023-10-08 NOTE — Hospital Course (Addendum)
 [name] is a 73 y.o. year old with a history of HTN, HLD, PE, prior CVA, CKDII and Migraines who presented with 3 recent falls, fatigue, dyspnea, and LE swelling and was admitted to the [name] Medicine Teaching Service for frequent falls.  Frequent Falls Multiple falls over the past week with dyspnea and weakness. CT head without acute findings. Highest suspicion for vasovagal episode in setting of recent hot weather. Orthostatics normal and seen by PT/OT, no further recommendations.  Dyspnea Peripheral edema Mild bilateral peripheral edema with recent cardiac stenting 08/2023.  ACS workup negative. D-dimer positive on 7/31 but subsequent DVT US  and VQ scan negative.  Low concern for CHF exacerbation and continued on home Lasix  40 mg daily.   Other chronic conditions were medically managed with home medications and formulary alternatives as necessary (HTN, CHF, HLD, GERD, CAD s/p stent, insomnia, chronic back pain, migraines/neuralgia).  PCP Follow-up Recommendations: Monitor volume status, consider increasing the Lasix  40 mg twice daily

## 2023-10-08 NOTE — Assessment & Plan Note (Addendum)
 BNP WNL, CXR without pulmonary edema, saturating well on room air.  Lower extremity edema improved this morning, could be attributable to either effective diuresis or patient elevating legs overnight. - Continue Lasix  40 mg PO (similar to home dose, takes daily or BID variably) - Daily weights and strict I&Os - Heart healthy diet - AM BMP

## 2023-10-08 NOTE — Care Management Obs Status (Cosign Needed)
 MEDICARE OBSERVATION STATUS NOTIFICATION   Patient Details  Name: [name] MRN: [id number] Date of Birth: 13-Jun-1950   Medicare Observation Status Notification Given:  Yes    Rosaline JONELLE Joe, RN 10/08/2023, 2:05 PM

## 2023-10-08 NOTE — Assessment & Plan Note (Signed)
 HLD: Continue home rosuvastatin  5 mg daily GERD: Continue pantoprazole  40 mg PO BID Hx cardiac stent: Continue DAPT (clopidogrel  + ASA) Insomnia: Continue home trazodone  25 mg at bedtime Chronic back pain: Continue home tramadol  50 BID PRN Migraines/neuralgia: Continue zonisamide  100 mg BID

## 2023-10-08 NOTE — Assessment & Plan Note (Addendum)
 BP 186/84 on admission, but asymptomatic and labwork without evidence of end organ damage.  No concern for hypertensive emergency. BP 160s/90s today.  - Continue home losartan  50 mg daily - Hold home Coreg  and amlodipine 

## 2023-10-08 NOTE — Progress Notes (Signed)
 BLE venous exam is completed. Teffany Blaszczyk, RVT

## 2023-10-08 NOTE — Consult Note (Addendum)
 WOC Nurse Consult Note:  WOC consult performed remotely utilizing chart review and imaging Reason for Consult: R leg abrasions Wound type: Partial skin loss skin tears to R leg Pressure Injury POA: NA Measurement: see nursing flowsheets Wound bed: red, moist Drainage (amount, consistency, odor) see nursing flowsheets Periwound: intact Dressing procedure/placement/frequency:  Cleanse with NS, allow to air dry.  Apply Mupirocin  ointment over wound bed and cover with non-adherent pad and wrap with Kerlix.   WOC team will not follow patient at this time.  Please re-consult if new needs arise.   Thank you,  Doyal Polite, RN, MSN, [facility] WOC Team

## 2023-10-08 NOTE — Plan of Care (Signed)

## 2023-10-08 NOTE — ED Notes (Signed)
 Pt and family provided coffee. Call bell within reach - no other concerns at this time.

## 2023-10-08 NOTE — Assessment & Plan Note (Addendum)
 Frequent falls, dyspnea, and weakness for past week.  No evidence of seizures, stroke, vasovagal syncope.  Hgb stable and not on blood thinners, just DAPT.  Suspect deconditioning and dehydration in setting of recent hot weather, orthostatic vitals normal 7/31am. Positive D dimer, pending DVT scan and CTAPE.  - Given positive D-dimer proceed with lower extremity ultrasound and CTA PE. - Admit to FMTS inpatient, med-tele level of care, attending Dr. Donzetta - Continuous cardiac monitoring - WOC consult for R leg abrasions - PT/OT eval and treat -AM CBC

## 2023-10-09 ENCOUNTER — Observation Stay (HOSPITAL_BASED_OUTPATIENT_CLINIC_OR_DEPARTMENT_OTHER)

## 2023-10-09 DIAGNOSIS — R0789 Other chest pain: Secondary | ICD-10-CM | POA: Diagnosis not present

## 2023-10-09 DIAGNOSIS — R0609 Other forms of dyspnea: Secondary | ICD-10-CM | POA: Diagnosis not present

## 2023-10-09 DIAGNOSIS — I5032 Chronic diastolic (congestive) heart failure: Secondary | ICD-10-CM | POA: Diagnosis not present

## 2023-10-09 DIAGNOSIS — Z7902 Long term (current) use of antithrombotics/antiplatelets: Secondary | ICD-10-CM | POA: Diagnosis not present

## 2023-10-09 DIAGNOSIS — N182 Chronic kidney disease, stage 2 (mild): Secondary | ICD-10-CM | POA: Diagnosis not present

## 2023-10-09 DIAGNOSIS — Z8673 Personal history of transient ischemic attack (TIA), and cerebral infarction without residual deficits: Secondary | ICD-10-CM | POA: Diagnosis not present

## 2023-10-09 DIAGNOSIS — R0600 Dyspnea, unspecified: Secondary | ICD-10-CM | POA: Diagnosis not present

## 2023-10-09 DIAGNOSIS — R296 Repeated falls: Principal | ICD-10-CM

## 2023-10-09 DIAGNOSIS — Z86711 Personal history of pulmonary embolism: Secondary | ICD-10-CM | POA: Diagnosis not present

## 2023-10-09 DIAGNOSIS — R600 Localized edema: Secondary | ICD-10-CM | POA: Diagnosis not present

## 2023-10-09 DIAGNOSIS — Z79899 Other long term (current) drug therapy: Secondary | ICD-10-CM | POA: Diagnosis not present

## 2023-10-09 DIAGNOSIS — I130 Hypertensive heart and chronic kidney disease with heart failure and stage 1 through stage 4 chronic kidney disease, or unspecified chronic kidney disease: Secondary | ICD-10-CM | POA: Diagnosis not present

## 2023-10-09 DIAGNOSIS — G8929 Other chronic pain: Secondary | ICD-10-CM | POA: Diagnosis not present

## 2023-10-09 LAB — CBC
HCT: 42.1 % (ref 36.0–46.0)
Hemoglobin: 13.9 g/dL (ref 12.0–15.0)
MCH: 28.9 pg (ref 26.0–34.0)
MCHC: 33 g/dL (ref 30.0–36.0)
MCV: 87.5 fL (ref 80.0–100.0)
Platelets: 327 K/uL (ref 150–400)
RBC: 4.81 MIL/uL (ref 3.87–5.11)
RDW: 13.4 % (ref 11.5–15.5)
WBC: 11.5 K/uL — ABNORMAL HIGH (ref 4.0–10.5)
nRBC: 0 % (ref 0.0–0.2)

## 2023-10-09 LAB — MAGNESIUM: Magnesium: 2.3 mg/dL (ref 1.7–2.4)

## 2023-10-09 LAB — BASIC METABOLIC PANEL WITH GFR
Anion gap: 10 (ref 5–15)
BUN: 27 mg/dL — ABNORMAL HIGH (ref 8–23)
CO2: 24 mmol/L (ref 22–32)
Calcium: 9 mg/dL (ref 8.9–10.3)
Chloride: 106 mmol/L (ref 98–111)
Creatinine, Ser: 1.11 mg/dL — ABNORMAL HIGH (ref 0.44–1.00)
GFR, Estimated: 53 mL/min — ABNORMAL LOW (ref >60)
Glucose, Bld: 119 mg/dL — ABNORMAL HIGH (ref 70–99)
Potassium: 3.7 mmol/L (ref 3.5–5.1)
Sodium: 140 mmol/L (ref 135–145)

## 2023-10-09 LAB — ECHOCARDIOGRAM COMPLETE
Area-P 1/2: 4.06 cm2
Calc EF: 61.7 %
Height: 66 in
S' Lateral: 3 cm
Single Plane A2C EF: 63.6 %
Single Plane A4C EF: 60.5 %
Weight: 2694.9 [oz_av]

## 2023-10-09 LAB — TROPONIN I (HIGH SENSITIVITY)
Troponin I (High Sensitivity): 7 ng/L (ref <18)
Troponin I (High Sensitivity): 8 ng/L (ref <18)

## 2023-10-09 MED ORDER — METOPROLOL SUCCINATE ER 25 MG PO TB24
25.0000 mg | ORAL_TABLET | Freq: Every day | ORAL | 1 refills | Status: DC
Start: 2023-10-09 — End: 2023-11-02

## 2023-10-09 NOTE — Plan of Care (Signed)

## 2023-10-09 NOTE — Assessment & Plan Note (Addendum)
 Elevated BP on admission but better controlled during admission. Asymptomatic.  - Continue home losartan  50 mg daily - Continue to hold home Coreg  and amlodipine 

## 2023-10-09 NOTE — Progress Notes (Signed)
 Daily Progress Note Intern Pager: [phone]  Patient name: [name] Medical record number: [id number] Date of birth: 03-10-1951 Age: 73 y.o. Gender: female  Primary Care Provider: Lonnie Earnest, MD Consultants: none  Code Status: DNR-limited  Pt Overview and Major Events to Date:  7/31 admitted  Medical Decision Making:  73 yo female with 3 falls this past week with fatigue, dyspnea, and LE swelling initially, now with new chest heaviness. Suspect initial presentation likely due to dehydration/deconditioning in setting of heat wave. Have ruled out PE, DVT, acute intracranial process, MI. Telemetry with nonsustained VT and new chest heaviness concerning for cardiac etiology. Pertinent PMH/PSH includes HTN, HLD, PE, prior CVA, CKDII.  Assessment & Plan Frequent falls A week of falls, dyspnea, weakness now with overnight chest heaviness and nonsustained VT. Initially ruled out PE, DVT, STEMI. Do not suspect seizures or stroke. Normal orthostatics. Hgb Stable. WBC bumped marginally. Suspect dehydration and deconditioning in the setting of heat wave, but will reevaluate for cardiac etiology. - continue telemetry - ordered echo, EKG, troponin - PT/OT evaluated and signed off - WOC recs for mupirocin  and Kerlix.  Hypertension Elevated BP on admission but better controlled during admission. Asymptomatic.  - Continue home losartan  50 mg daily - Continue to hold home Coreg  and amlodipine  Chronic diastolic heart failure (HCC) Do not suspect symptoms due to heart failure based on normal BNP and current lack of edema without change in diuresis.  - Continue home lasix  40 mg PO - Continue daily weights, I&Os  Chronic and Stable Issues: HLD - home rosuvastatin  5 mg every day GERD - pantoprazole  40 mg PO BID Hx cardiac stent - DAPT (clopidogrel  and Aspirin ) Insomnia - home trazadone 25 mg qhs PRN Chronic back pain - home tramadol  50 mg BID PRN Migraines/neuralgia - home zonisamide   100 mg BID  FEN/GI: heart healthy PPx: Buckley lovenox  Dispo:Home tomorrow. Barriers include pending cardiac eval.   Subjective:  O/n patient had heavy chest around 2 am. She walked around the unit and her pain improved. No dyspnea, diaphoresis at that time. Otherwise she is feeling improved from admission. Able to ambulate without difficulty.    Objective: Temp:  [97.7 F (36.5 C)-98.4 F (36.9 C)] 97.7 F (36.5 C) (08/01 0123) Pulse Rate:  [74-91] 79 (08/01 0123) BP: (92-164)/(51-93) 92/51 (08/01 0123) SpO2:  [95 %-100 %] 100 % (08/01 0123) Weight:  [76.4 kg] 76.4 kg (07/31 1050)   Intake/Output Summary (Last 24 hours) at 10/09/2023 0917 Last data filed at 10/09/2023 0814 Gross per 24 hour  Intake 720 ml  Output 800 ml  Net -80 ml   Physical Exam: General: well appearing, sitting up in bed Cardiovascular: RRR, no m/r/g Respiratory: CTAB, no increased WOB Abdomen: soft, nontender Extremities: No edema   Laboratory: Most recent CBC Lab Results  Component Value Date   WBC 11.5 (H) 10/09/2023   HGB 13.9 10/09/2023   HCT 42.1 10/09/2023   MCV 87.5 10/09/2023   PLT 327 10/09/2023   Most recent BMP    Latest Ref Rng & Units 10/09/2023    2:43 AM  BMP  Glucose 70 - 99 mg/dL 880   BUN 8 - 23 mg/dL 27   Creatinine 9.55 - 1.00 mg/dL 8.88   Sodium 864 - 854 mmol/L 140   Potassium 3.5 - 5.1 mmol/L 3.7   Chloride 98 - 111 mmol/L 106   CO2 22 - 32 mmol/L 24   Calcium  8.9 - 10.3 mg/dL 9.0  Imaging/Diagnostic Tests: Head CT 10/08/23 Rads Impression: Stable and normal for age noncontrast CT appearance of the brain.  My impression: no acute hemorrhage  EKG 10/08/23 My impression: NSR without evidence of infarct  PVL 10/08/23 Impression: RIGHT:  - There is no evidence of deep vein thrombosis in the lower extremity.   - No cystic structure found in the popliteal fossa.   LEFT:  - There is no evidence of deep vein thrombosis in the lower extremity.  - No cystic structure  found in the popliteal fossa.   VQ scan 10/08/23  Impression: Very low probability for pulmonary embolism.   Alena Morrison, [place], MD 10/09/2023, 8:04 AM  PGY-1, [facility] Health Family Medicine FPTS Intern pager: [phone], text pages welcome Secure chat group [facility] Teaching Service

## 2023-10-09 NOTE — Progress Notes (Signed)
 DISCHARGE NOTE HOME PAYSEN GOZA to be discharged Home per MD order. Discussed prescriptions and follow up appointments with the patient. Prescriptions given to patient; medication list explained in detail. Patient verbalized understanding.  Skin clean, dry and intact without evidence of skin break down, no evidence of skin tears noted. IV catheter discontinued intact. Site without signs and symptoms of complications. Dressing and pressure applied. Pt denies pain at the site currently. No complaints noted.  Patient free of lines, drains, and wounds.   An After Visit Summary (AVS) was printed and given to the patient. Patient escorted via wheelchair, and discharged home via private auto.  Peyton SHAUNNA Pepper, RN

## 2023-10-09 NOTE — Assessment & Plan Note (Addendum)
 A week of falls, dyspnea, weakness now with overnight chest heaviness and nonsustained VT. Initially ruled out PE, DVT, STEMI. Do not suspect seizures or stroke. Normal orthostatics. Hgb Stable. WBC bumped marginally. Suspect dehydration and deconditioning in the setting of heat wave, but will reevaluate for cardiac etiology. - continue telemetry - ordered echo, EKG, troponin - PT/OT evaluated and signed off - WOC recs for mupirocin  and Kerlix.

## 2023-10-09 NOTE — Discharge Instructions (Addendum)
 Thank you for letting us  care for you during your stay.  You were admitted to the [facility] Medicine Teaching Service.   You were admitted for falls, shortness of breath likely secondary to dehydration/deconditioning in the heat wave.  Your workup was unremarkable.    We are discharging you with a new medicine to help you control your heart rate.   We recommend follow up specifically for adjustment of your lasix  dose, management of your hypertension, and evaluation for management of all of your medications.   There were no other important diagnoses during your stay.   Please follow up with your primary care physician in 1 week and your cardiologist in September.  If your symptoms worsen or return, please return to the hospital.  Please let us  know if you have questions about your stay at [facility].

## 2023-10-09 NOTE — Assessment & Plan Note (Addendum)
 Do not suspect symptoms due to heart failure based on normal BNP and current lack of edema without change in diuresis.  - Continue home lasix  40 mg PO - Continue daily weights, I&Os

## 2023-10-09 NOTE — Discharge Summary (Addendum)
 Family Medicine Teaching [facility] Discharge Summary  Patient name: [name] Medical record number: [id number] Date of birth: June 17, 1950 Age: 73 y.o. Gender: female Date of Admission: 10/07/2023  Date of Discharge: 10/09/23 Admitting Physician: Claudetta Silence, MD  Primary Care Provider: Lonnie Earnest, MD Consultants: cardiology  Indication for Hospitalization: falls, dyspnea  Brief Hospital Course:  [name] is a 73 y.o. year old with a history of HTN, HLD, PE, prior CVA, CKDII and Migraines who presented with 3 recent falls, fatigue, dyspnea, and LE swelling and was admitted to the [facility] Medicine Teaching Service for frequent falls.  Frequent Falls Multiple falls over the past week with dyspnea and weakness. CT head without acute findings. Highest suspicion for vasovagal episode in setting of recent hot weather. Orthostatics normal and seen by PT/OT, no further recommendations.  Dyspnea Peripheral edema Mild bilateral peripheral edema with recent cardiac stenting 08/2023.  ACS workup negative. D-dimer positive on 7/31 but subsequent DVT US  and VQ scan negative.  Low concern for CHF exacerbation and continued on home Lasix  40 mg PRN.  Chest Heaviness Patient with transient chest heaviness that resolved with activity during stay. EKG, troponin, echo negative. Telemetry with a few beats of nonsustained VT, subsequent cardiac workup negative. Consulted cardiology who recommended home D/C on Toprol XL 25 mg.   Other chronic conditions were medically managed with home medications and formulary alternatives as necessary (HTN, CHF, HLD, GERD, CAD s/p stent, insomnia, chronic back pain, migraines/neuralgia).  PCP Follow-up Recommendations: Monitor volume status, adjust lasix  as needed Follow up with cardiologist as previously scheduled.  Discontinued amlodipine  inpatient due to swelling, recommend reevaluating HTN regimen Started ToprolXL at discharge instead of the  carvedilol  pt was previously on, f/u on tolerance Recommend Geri consult outpatient given polypharmacy.   Discharge Diagnoses/Problem List:  Principal Problem:   Falls frequently Active Problems:   Frequent falls   Chronic diastolic heart failure (HCC)   Chronic health problem   Hypertension  Disposition: Home  Discharge Condition: Stable   Discharge Exam:  General: well appearing, sitting up in bed Cardiovascular: RRR, no m/r/g Respiratory: CTAB, no increased WOB Abdomen: soft, nontender Extremities: No edema   Significant Procedures: none  Significant Labs and Imaging:  Recent Labs  Lab 10/09/23 0243  WBC 11.5*  HGB 13.9  HCT 42.1  PLT 327   Recent Labs  Lab 10/08/23 0113 10/09/23 0243 10/09/23 0930  NA 137 140  --   K 3.8 3.7  --   CL 110 106  --   CO2 19* 24  --   GLUCOSE 155* 119*  --   BUN 18 27*  --   CREATININE 1.16* 1.11*  --   CALCIUM  8.8* 9.0  --   MG 2.0  --  2.3   Troponins 8 (8/1) <7 (8/1) <6 (7/30) <6 (7/30)  Results/Tests Pending at Time of Discharge: none  Discharge Medications:  Allergies as of 10/09/2023       Reactions   Shellfish Allergy Anaphylaxis   All seafood   Crestor  [rosuvastatin ] Other (See Comments)   Anxiety, itching, headaches, myalgias Able to tolerate 5mg  daily   Lipitor [atorvastatin ] Other (See Comments)   Anxiety, itching, headaches, myalgias   Statins Other (See Comments)   Arthralgias (severe) with atorvastatin , mild arthralgias with rosuvastatin  but willing to continue taking it   Iodine         Medication List     STOP taking these medications    amLODipine  5 MG  tablet Commonly known as: NORVASC    carvedilol  3.125 MG tablet Commonly known as: COREG    tiZANidine  4 MG tablet Commonly known as: ZANAFLEX        TAKE these medications    albuterol  108 (90 Base) MCG/ACT inhaler Commonly known as: VENTOLIN  HFA Inhale 2 puffs into the lungs every 6 (six) hours as needed for wheezing or shortness  of breath.   aspirin  EC 81 MG tablet Take 1 tablet (81 mg total) by mouth daily. Swallow whole.   BIOTIN PO Take 1 capsule by mouth daily. Dosage unknown   CALCIUM  PO Take 1 tablet by mouth daily. Dosage unknown   cetirizine  10 MG tablet Commonly known as: ZYRTEC  Take 1 tablet (10 mg total) by mouth daily with breakfast.   CHOLECALCIFEROL PO Take 1 tablet by mouth daily. Dosage unknown   clopidogrel  75 MG tablet Commonly known as: PLAVIX  TAKE 1 TABLET BY MOUTH EVERY DAY   fluticasone  50 MCG/ACT nasal spray Commonly known as: FLONASE  INSTILL 1 SPRAY INTO BOTH NOSTRILS DAILY   furosemide  40 MG tablet Commonly known as: LASIX  Take 1 tablet (40 mg total) by mouth daily as needed. For weight gain greater than 3lbs in one day or 5lbs in a week. What changed: when to take this   gabapentin  600 MG tablet Commonly known as: NEURONTIN  TAKE 1 TABLET BY MOUTH THREE TIMES A DAY   Leqvio  284 MG/1.5ML Sosy injection Generic drug: inclisiran Inject as directed   losartan  25 MG tablet Commonly known as: COZAAR  Take 1 tablet (25 mg total) by mouth daily.   metoprolol succinate 25 MG 24 hr tablet Commonly known as: Toprol XL Take 1 tablet (25 mg total) by mouth daily.   Nurtec 75 MG Tbdp Generic drug: Rimegepant Sulfate Take 1 tablet (75 mg total) by mouth daily as needed (take for abortive therapy of migraine, no more than 1 tablet in 24 hours or 10 per month).   ondansetron  4 MG tablet Commonly known as: Zofran  Take 1 tablet (4 mg total) by mouth every 8 (eight) hours as needed for nausea or vomiting.   pantoprazole  40 MG tablet Commonly known as: PROTONIX  TAKE 1 TABLET BY MOUTH TWICE A DAY   rosuvastatin  5 MG tablet Commonly known as: CRESTOR  TAKE 1 TABLET (5 MG TOTAL) BY MOUTH DAILY.   sertraline  100 MG tablet Commonly known as: ZOLOFT  TAKE 1 TABLET BY MOUTH EVERY DAY   traMADol  50 MG tablet Commonly known as: ULTRAM  TAKE 1 TABLET EVERY 8 HOURS AS NEEDED FOR  MODERATE PAIN   traZODone  50 MG tablet Commonly known as: DESYREL  TAKE 1/2 TO 1 TABLET BY MOUTH AT BEDTIME AS NEEDED FOR SLEEP   zonisamide  100 MG capsule Commonly known as: ZONEGRAN  Take 1 capsule (100 mg total) by mouth 2 (two) times daily.        Discharge Instructions: Please refer to Patient Instructions section of EMR for full details.  Patient was counseled important signs and symptoms that should prompt return to medical care, changes in medications, dietary instructions, activity restrictions, and follow up appointments.   Follow-Up Appointments:   Elio Roys, MD 10/09/2023, 2:55 PM PGY-1, [facility]  Upper Level Addendum: I have seen and evaluated this patient along with Dr. Roys and reviewed the above note, making necessary revisions as appropriate. I agree with the medical decision making and physical exam as noted above. Elyce Prescott, DO PGY-2 [facility] Family Medicine Residency

## 2023-10-12 NOTE — Progress Notes (Unsigned)
  PROCEDURE RECORD [facility] Physical Medicine and Rehabilitation   Name: [name] DOB:1950/07/28 MRN: [id number]  Date:10/12/2023  Physician: Prentice Compton, MD    Nurse/CMA: ADIN RN  Allergies:  Allergies  Allergen Reactions   Shellfish Allergy Anaphylaxis    All seafood   Crestor  [Rosuvastatin ] Other (See Comments)    Anxiety, itching, headaches, myalgias Able to tolerate 5mg  daily   Lipitor [Atorvastatin ] Other (See Comments)    Anxiety, itching, headaches, myalgias   Statins Other (See Comments)    Arthralgias (severe) with atorvastatin , mild arthralgias with rosuvastatin  but willing to continue taking it   Iodine     Consent Signed: {yes wn:[id number]}  Is patient diabetic? {yes no:314532}  CBG today? ***  Pregnant: {yes no:314532} LMP: No LMP recorded. Patient has had a hysterectomy. (age 28-55)  Anticoagulants: {Yes/No:19989} Anti-inflammatory: {Yes/No:19989} Antibiotics: {Yes/No:19989}  Procedure: LEFT LUMBAR  3-4-5 MEDICAL BRANCH BLOCK  Position: Prone Start Time: ***  End Time: ***  Fluoro Time: ***  RN/CMA *** Finnbar Cedillos RN    Time *** ***    BP *** ***    Pulse *** ***    Respirations *** ***    O2 Sat *** ***    S/S *** ***    Pain Level *** ***     D/C home with ***, [name] & O X 3, D/C instructions reviewed, and sits independently.

## 2023-10-15 ENCOUNTER — Encounter: Attending: Physical Medicine & Rehabilitation | Admitting: Physical Medicine & Rehabilitation

## 2023-10-15 ENCOUNTER — Inpatient Hospital Stay: Payer: Self-pay | Admitting: Family Medicine

## 2023-10-15 ENCOUNTER — Encounter: Payer: Self-pay | Admitting: Physical Medicine & Rehabilitation

## 2023-10-15 VITALS — BP 143/80 | HR 68 | Temp 98.4°F | Ht 66.0 in | Wt 171.0 lb

## 2023-10-15 DIAGNOSIS — M47817 Spondylosis without myelopathy or radiculopathy, lumbosacral region: Secondary | ICD-10-CM | POA: Diagnosis not present

## 2023-10-15 MED ORDER — LIDOCAINE HCL 1 % IJ SOLN
10.0000 mL | Freq: Once | INTRAMUSCULAR | Status: AC
  Administered 2023-10-15: 10 mL

## 2023-10-15 MED ORDER — LIDOCAINE HCL (PF) 2 % IJ SOLN
3.0000 mL | Freq: Once | INTRAMUSCULAR | Status: AC
  Administered 2023-10-15: 3 mL

## 2023-10-15 MED ORDER — IOHEXOL 180 MG/ML  SOLN
3.0000 mL | Freq: Once | INTRAMUSCULAR | Status: AC
  Administered 2023-10-15: 3 mL

## 2023-10-15 NOTE — Progress Notes (Signed)
 Left Lumbar L3, L4  medial branch blocks and L 5 dorsal ramus injection under fluoroscopic guidance   Indication: Left Lumbar pain which is not relieved by medication management or other conservative care and interfering with self-care and mobility.  Informed consent was obtained after describing risks and benefits of the procedure with the patient, this includes bleeding, bruising, infection, paralysis and medication side effects.  The patient wishes to proceed and has given written consent.  The patient was placed in a prone position.  The lumbar area was marked and prepped with Betadine.  One mL of 1% lidocaine  was injected into each of 3 areas into the skin and subcutaneous tissue.  Then a 22-gauge 3.5 in spinal needle was inserted targeting the junction of the left S1 superior articular process and sacral ala junction.  Needle was advanced under fluoroscopic guidance.  Bone contact was made. Isovue  200 was injected x 0.5 mL demonstrating no intravascular uptake.  Then a solution containing  2% MPF lidocaine  was injected x 0.5 mL.  Then the left L5 superior articular process in transverse process junction was targeted.  Bone contact was made. Isovue  200 was injected x 0.5 mL demonstrating no intravascular uptake.  Then a solution containing 2% MPF lidocaine  was injected x 0.5 mL.  Then the left L4 superior articular process in transverse process junction was targeted.  Bone contact was made.  Isovue  200 was injected x 0.5 mL demonstrating no intravascular uptake.  Then a solution containing  2% MPF lidocaine  was injected x 0.5 mL.  Patient tolerated procedure well.  Post procedure instructions were given.   Pre injection pain score 8/10 Postinjection pain score 1/10  Will see how long her pain relief is prior to determining further interventional pain procedures Return to clinic 6 months

## 2023-10-21 ENCOUNTER — Encounter: Payer: Self-pay | Admitting: Family Medicine

## 2023-10-21 ENCOUNTER — Ambulatory Visit (INDEPENDENT_AMBULATORY_CARE_PROVIDER_SITE_OTHER): Admitting: Family Medicine

## 2023-10-21 VITALS — BP 102/66 | HR 95 | Ht 66.0 in | Wt 174.8 lb

## 2023-10-21 DIAGNOSIS — I10 Essential (primary) hypertension: Secondary | ICD-10-CM

## 2023-10-21 DIAGNOSIS — I5032 Chronic diastolic (congestive) heart failure: Secondary | ICD-10-CM | POA: Diagnosis not present

## 2023-10-21 DIAGNOSIS — R296 Repeated falls: Secondary | ICD-10-CM

## 2023-10-21 DIAGNOSIS — R600 Localized edema: Secondary | ICD-10-CM

## 2023-10-21 NOTE — Progress Notes (Signed)
    SUBJECTIVE:   CHIEF COMPLAINT / HPI:   Presenting today for hospital follow up. Recently hospitalized (7/30-8/1) for frequent falls, LE swelling.   Hx falls -No falls since hospitalization  -No presyncope either   Edema  Chest heaviness -Legs have been doing well, keeping feet elevated, taking lasix  every day  -Taking Toprol  25 daily -Chest heaviness lighter and less often  -No SOB, CP, VC -Amlodipine  was stopped during admission for swelling  -Plan for Cardiologist fu   PERTINENT  PMH / PSH: CHF, HTN, HLD, CVA, CKD3  OBJECTIVE:   BP 102/66   Pulse 95   Ht 5' 6 (1.676 m)   Wt 174 lb 12.8 oz (79.3 kg)   SpO2 97%   BMI 28.21 kg/m   General: Well-appearing. Resting comfortably in room. CV: Normal S1/S2. No extra heart sounds. Warm and well-perfused. Pulm: Breathing comfortably on room air. CTAB. No increased WOB. Abd: Soft, non-tender, non-distended. Skin/Ext:  Warm, dry skin. BLE without edema, erythema, or tenderness. Intact DP pulses.  Psych: Pleasant and appropriate.    ASSESSMENT/PLAN:   Assessment & Plan Bilateral lower extremity edema Chronic diastolic heart failure (HCC) Euvolemic on exam today. Last echo 10/09/23 with LVEF 60-65%, mild LV hypertrophy.  -Cont lasix  40 daily, toprol  25 daily, losartan  25 daily -BMP today to assess kidney function  -FU with cardiology scheduled 11/23/23 Essential hypertension Well-controlled on recheck today. Asymptomatic.  -Cont medications as above -BMP as above Frequent falls Fortunately none since hospital discharge.  -CTM   RTC in 1 month for follow up.   Damien Cassis, MD [facility]

## 2023-10-21 NOTE — Assessment & Plan Note (Signed)
 Well-controlled on recheck today. Asymptomatic.  -Cont medications as above -BMP as above

## 2023-10-21 NOTE — Patient Instructions (Addendum)
 Thank you for visiting clinic today and allowing us  to participate in your care!  So glad you are feeling better! We are checking in on your labwork today and will let you know the results. Please continue taking your medications as you have been in the meantime. Please plan to follow with cardiology as scheduled below.   Please schedule an appointment in 1 month for follow up or sooner as needed.   Reach out any time with any questions or concerns you may have - we are here for you!  Damien Cassis, MD [facility] [phone]

## 2023-10-21 NOTE — Assessment & Plan Note (Signed)
 Fortunately none since hospital discharge.  -CTM

## 2023-10-21 NOTE — Assessment & Plan Note (Signed)
 Euvolemic on exam today. Last echo 10/09/23 with LVEF 60-65%, mild LV hypertrophy.  -Cont lasix  40 daily, toprol  25 daily, losartan  25 daily -BMP today to assess kidney function  -FU with cardiology scheduled 11/23/23

## 2023-10-22 ENCOUNTER — Ambulatory Visit: Payer: Self-pay | Admitting: Family Medicine

## 2023-10-22 LAB — BASIC METABOLIC PANEL WITH GFR
BUN/Creatinine Ratio: 19 (ref 12–28)
BUN: 21 mg/dL (ref 8–27)
CO2: 23 mmol/L (ref 20–29)
Calcium: 8.7 mg/dL (ref 8.7–10.3)
Chloride: 107 mmol/L — ABNORMAL HIGH (ref 96–106)
Creatinine, Ser: 1.13 mg/dL — ABNORMAL HIGH (ref 0.57–1.00)
Glucose: 114 mg/dL — ABNORMAL HIGH (ref 70–99)
Potassium: 4.3 mmol/L (ref 3.5–5.2)
Sodium: 143 mmol/L (ref 134–144)
eGFR: 52 mL/min/1.73 — ABNORMAL LOW (ref >59)

## 2023-11-01 ENCOUNTER — Other Ambulatory Visit: Payer: Self-pay

## 2023-11-05 DIAGNOSIS — N1831 Chronic kidney disease, stage 3a: Secondary | ICD-10-CM | POA: Diagnosis not present

## 2023-11-07 DIAGNOSIS — M47817 Spondylosis without myelopathy or radiculopathy, lumbosacral region: Secondary | ICD-10-CM | POA: Diagnosis not present

## 2023-11-10 DIAGNOSIS — I129 Hypertensive chronic kidney disease with stage 1 through stage 4 chronic kidney disease, or unspecified chronic kidney disease: Secondary | ICD-10-CM | POA: Diagnosis not present

## 2023-11-10 DIAGNOSIS — D631 Anemia in chronic kidney disease: Secondary | ICD-10-CM | POA: Diagnosis not present

## 2023-11-10 DIAGNOSIS — R3129 Other microscopic hematuria: Secondary | ICD-10-CM | POA: Diagnosis not present

## 2023-11-10 DIAGNOSIS — I5032 Chronic diastolic (congestive) heart failure: Secondary | ICD-10-CM | POA: Diagnosis not present

## 2023-11-10 DIAGNOSIS — N1831 Chronic kidney disease, stage 3a: Secondary | ICD-10-CM | POA: Diagnosis not present

## 2023-11-10 DIAGNOSIS — N2581 Secondary hyperparathyroidism of renal origin: Secondary | ICD-10-CM | POA: Diagnosis not present

## 2023-11-15 ENCOUNTER — Other Ambulatory Visit: Payer: Self-pay | Admitting: Physical Medicine & Rehabilitation

## 2023-11-16 ENCOUNTER — Other Ambulatory Visit: Payer: Self-pay | Admitting: Family Medicine

## 2023-11-16 DIAGNOSIS — I10 Essential (primary) hypertension: Secondary | ICD-10-CM

## 2023-11-16 NOTE — Telephone Encounter (Signed)
 Chart reviewed. Rx refilled. Has upcoming appointment 9/17.

## 2023-11-23 ENCOUNTER — Encounter (HOSPITAL_BASED_OUTPATIENT_CLINIC_OR_DEPARTMENT_OTHER): Payer: Self-pay | Admitting: Family

## 2023-11-23 ENCOUNTER — Ambulatory Visit (HOSPITAL_BASED_OUTPATIENT_CLINIC_OR_DEPARTMENT_OTHER): Admitting: Family

## 2023-11-23 VITALS — BP 104/56 | HR 64 | Resp 16 | Ht 66.0 in | Wt 172.0 lb

## 2023-11-23 DIAGNOSIS — E785 Hyperlipidemia, unspecified: Secondary | ICD-10-CM

## 2023-11-23 DIAGNOSIS — I25118 Atherosclerotic heart disease of native coronary artery with other forms of angina pectoris: Secondary | ICD-10-CM | POA: Diagnosis not present

## 2023-11-23 DIAGNOSIS — R5383 Other fatigue: Secondary | ICD-10-CM

## 2023-11-23 DIAGNOSIS — I5032 Chronic diastolic (congestive) heart failure: Secondary | ICD-10-CM | POA: Diagnosis not present

## 2023-11-23 DIAGNOSIS — E559 Vitamin D deficiency, unspecified: Secondary | ICD-10-CM

## 2023-11-23 MED ORDER — FUROSEMIDE 40 MG PO TABS: ORAL_TABLET | ORAL | 2 refills | Status: AC

## 2023-11-23 NOTE — Progress Notes (Signed)
 Cardiology Office Note   Date:  11/29/2023  ID:  Courtney, [name] 06/20/1950, MRN [id number] PCP: Lonnie Earnest, MD  [facility] Providers Cardiologist:  Annabella Scarce, MD     History of Present Illness [name] is a 73 y.o. female  with a hx of HTN, CAD s/p DES-LAD 08/25/2023, HLD (intolerant to Atorvastatin , Praluent ), CKD III, CVA, left subclavian artery stenosis, prior tobacco use, prior DVT 2019 treated with Eliquis  for 6 months.   Seen 08/05/22 with 3 episodes of syncope without prodromal symptoms.  She was not orthostatic.  30-day heart monitor, CT neck, chest as well as echocardiogram were recommended. Echo 09/04/22 LVEF 60 to 65%, mild LVH, grade 1 diastolic dysfunction, mild to moderate aortic regurgitation.  CT chest 08/11/22 no PE, known compression fracture of thoracic spine, coronary calcification, aortic atherosclerosis. 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 She did not complete monitor and as no recurrent symptoms, it was deferred.    Seen 05/08/2023 doing overall well from a cardiac perspective.  Zetia  stopped as lipid controlled with Crestor , Incliseron.    Admitted 6/15-6/18//25 after presenting with chest pain and unstable angina.  EKG aVR elevation and new T wave inversion in leads V4 through V6 and diffuse ST depressions.  LHC 08/25/2583% stenosis of proximal LAD treated with DES recommended for DAPT for 12 months At follow up 08/28/23 she was doing overall well, no changes made.    Admitted 09/2023 with physical deconditioning, frequent falls. She had transient chest heaviness that resolved with activity with normal EKG, troponin, unremarkable echocardiogram. Amlodipine  was discontinued due to swelling and Carvedilol  changed to Toprol .  Upper left chest heaviness which will start first then  will get a sharpness under lher left armpit. Once in awhile will radiate  to her left arm. When it does go down her arm she will get a headache up the left side of her head. It is not relieved by Tylenol . NOtes the chest pain occurs at rest or with activity. The chest discomfort lasts 10 minutes and actually resolves if she gets up and moves. Notes it is also makes her feel like she can't sleep. She has been sleeping on 3 pillows even prior to her  stent.   She notes trouble staying asleep. Waking up every 2 hours just waking up no significant nocturia. Does sometimes wake feeling like she is smothering.   Notes her BP at home is labile. Did not bring specific readings. BP elevates with physical activity. She has had no recurrent falls since hospital discharge 10/09/23. She does still feel her muscles are weak but has been working to be active by walking outside.   She notes lightheadedness if she bends over to pick something  up. Reviewed orthostatic precautions. She is eating 2 meals per day. She is drinking [employer] electrolyte drinks as her son is concerned she is dehydrated. She has not had feet/ankle edema. She is taking her Furosemide  40mg  every morning, she only takes at night if she notes some swelling. She is only taking an extra dose once per week most often on the weekend.   ROS: Please see the history of present illness.    All other systems reviewed and are negative.   Studies Reviewed      Cardiac Studies & Procedures   ______________________________________________________________________________________________ CARDIAC CATHETERIZATION  CARDIAC CATHETERIZATION 08/25/2023  Conclusion   Prox LAD to Mid LAD lesion is 85%  stenosed.   Prox RCA to Mid RCA lesion is 10% stenosed.   Mid Cx lesion is 15% stenosed.   Mid LAD to Dist LAD lesion is 20% stenosed.   A drug-eluting stent was successfully placed using a STENT SYNERGY XD 3.0X12.   Post intervention, there is a 0% residual stenosis.   LV end diastolic pressure is normal.   Recommend  uninterrupted dual antiplatelet therapy with Aspirin  81mg  daily and Clopidogrel  75mg  daily for a minimum of 12 months (ACS-Class I recommendation).  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 RFR 0.62 Normal LVEDP 15 mm Hg Successful PCI of the LAD with DES x 1  Plan: DAPT for one year. Anticipate DC in am  Findings Coronary Findings Diagnostic  Dominance: Right  Left Anterior Descending Prox LAD to Mid LAD lesion is 85% stenosed. Pressure gradient was performed on the lesion. RFR: 0.62. Mid LAD to Dist LAD lesion is 20% stenosed.  Left Circumflex Mid Cx lesion is 15% stenosed.  Right Coronary Artery Prox RCA to Mid RCA lesion is 10% stenosed. The lesion is moderately calcified.  Intervention  Prox LAD to Mid LAD lesion Stent CATH [place]ISTA GUIDE 6FR XBLD 3.5 guide catheter was inserted. Lesion crossed with guidewire using a GUIDEWIRE PRESSURE X 175. Pre-stent angioplasty was performed using a BALLOON EMERGE MR 2.5X12. A drug-eluting stent was successfully placed using a STENT SYNERGY XD 3.0X12. Stent strut is well apposed. Post-stent angioplasty was performed using a BALLOON [place] EMERGE MR 3.25X8. Maximum pressure:  14 atm. Post-Intervention Lesion Assessment The intervention was successful. Pre-interventional TIMI flow is 3. Post-intervention TIMI flow is 3. No complications occurred at this lesion. There is a 0% residual stenosis post intervention.   STRESS TESTS  MYOCARDIAL PERFUSION IMAGING 12/20/2019  Interpretation Summary  The left ventricular ejection fraction is hyperdynamic (>65%).  Nuclear stress EF: 66%.  There was no ST segment deviation noted during stress.  Defect 1: There is a small defect of moderate severity present in the apical anterior and apex location.  This is a low risk study.  Low risk stress nuclear study with partially reversible distal anterior/apical defect likely related to shifting breast attenuation.  Cannot rule out minimal  ischemia.  Gated ejection fraction 66% with normal wall motion.   ECHOCARDIOGRAM  ECHOCARDIOGRAM COMPLETE 10/09/2023  Narrative ECHOCARDIOGRAM REPORT    Patient Name:   [name] Date of Exam: 10/09/2023 Medical Rec #:  [id number]        Height:       66.0 in Accession #:    [id number]       Weight:       168.4 lb Date of Birth:  1950-04-29        BSA:          1.859 m Patient Age:    72 years         BP:           149/75 mmHg Patient Gender: F                HR:           66 bpm. Exam Location:  Inpatient  Procedure: 2D Echo, Cardiac Doppler and Color Doppler (Both Spectral and Color Flow Doppler were utilized during procedure).  Indications:    Dyspnea  History:        Patient has prior history of Echocardiogram examinations. Signs/Symptoms:Dyspnea; Risk Factors:Hypertension.  Sonographer:    Vella Key Referring Phys: [id number] MARGARET E PRAY  IMPRESSIONS   1. Left ventricular ejection fraction, by estimation, is 60 to 65%. The left ventricle has normal function. The left ventricle has no regional wall motion abnormalities. There is mild left ventricular hypertrophy. Left ventricular diastolic parameters were normal. 2. Right ventricular systolic function is normal. The right ventricular size is normal. Tricuspid regurgitation signal is inadequate for assessing PA pressure. 3. The mitral valve is normal in structure. Trivial mitral valve regurgitation. No evidence of mitral stenosis. 4. The aortic valve is tricuspid. Aortic valve regurgitation is mild. No aortic stenosis is present. 5. The inferior vena cava is normal in size with greater than 50% respiratory variability, suggesting right atrial pressure of 3 mmHg.  FINDINGS Left Ventricle: Left ventricular ejection fraction, by estimation, is 60 to 65%. The left ventricle has normal function. The left ventricle has no regional wall motion abnormalities. The left ventricular internal cavity size was normal in size. There  is mild left ventricular hypertrophy. Left ventricular diastolic parameters were normal.  Right Ventricle: The right ventricular size is normal. No increase in right ventricular wall thickness. Right ventricular systolic function is normal. Tricuspid regurgitation signal is inadequate for assessing PA pressure.  Left Atrium: Left atrial size was normal in size.  Right Atrium: Right atrial size was normal in size.  Pericardium: There is no evidence of pericardial effusion.  Mitral Valve: The mitral valve is normal in structure. Trivial mitral valve regurgitation. No evidence of mitral valve stenosis.  Tricuspid Valve: The tricuspid valve is normal in structure. Tricuspid valve regurgitation is trivial.  Aortic Valve: The aortic valve is tricuspid. Aortic valve regurgitation is mild. No aortic stenosis is present.  Pulmonic Valve: The pulmonic valve was not well visualized. Pulmonic valve regurgitation is not visualized.  Aorta: The aortic root and ascending aorta are structurally normal, with no evidence of dilitation.  Venous: The inferior vena cava is normal in size with greater than 50% respiratory variability, suggesting right atrial pressure of 3 mmHg.  IAS/Shunts: The atrial septum is grossly normal.   LEFT VENTRICLE PLAX 2D LVIDd:         4.50 cm      Diastology LVIDs:         3.00 cm      LV e' medial:    8.27 cm/s LV PW:         1.10 cm      LV E/e' medial:  11.8 LV IVS:        1.10 cm      LV e' lateral:   10.80 cm/s LVOT diam:     1.60 cm      LV E/e' lateral: 9.0 LV SV:         59 LV SV Index:   32 LVOT Area:     2.01 cm  LV Volumes (MOD) LV vol d, MOD A2C: 83.3 ml LV vol d, MOD A4C: 101.0 ml LV vol s, MOD A2C: 30.3 ml LV vol s, MOD A4C: 39.9 ml LV SV MOD A2C:     53.0 ml LV SV MOD A4C:     101.0 ml LV SV MOD BP:      56.5 ml  RIGHT VENTRICLE RV Basal diam:  2.70 cm RV S prime:     10.20 cm/s TAPSE (M-mode): 2.6 cm  LEFT ATRIUM             Index         RIGHT ATRIUM  Index LA diam:        3.60 cm 1.94 cm/m   RA Area:     12.80 cm LA Vol (A2C):   22.3 ml 12.00 ml/m  RA Volume:   29.50 ml  15.87 ml/m LA Vol (A4C):   26.4 ml 14.20 ml/m LA Biplane Vol: 24.4 ml 13.13 ml/m AORTIC VALVE LVOT Vmax:   137.00 cm/s LVOT Vmean:  88.400 cm/s LVOT VTI:    0.294 m  AORTA Ao Root diam: 2.40 cm Ao Asc diam:  2.90 cm  MITRAL VALVE                TRICUSPID VALVE MV Area (PHT): 4.06 cm     TV Peak grad:   27.9 mmHg MV Decel Time: 187 msec     TV Vmax:        2.64 m/s MV E velocity: 97.50 cm/s MV A velocity: 108.00 cm/s  SHUNTS MV E/A ratio:  0.90         Systemic VTI:  0.29 m Systemic Diam: 1.60 cm  Lonni Nanas MD Electronically signed by Lonni Nanas MD Signature Date/Time: 10/09/2023/12:15:47 PM    Final   TEE  ECHO TEE 12/19/2015  Narrative *[place]* *[facility]* [address] [place], [place] [address] [phone]  ------------------------------------------------------------------- Transesophageal Echocardiography  Patient:    Daun, Rens MR #:       [id number] Study Date: 12/19/2015 Gender:     F Age:        45 Height: Weight: BSA: Pt. Status: Room:  TISA Maude Emmer, M.D. PERFORMING   Maude Emmer, M.D. REFERRING    Maude Emmer, M.D. ATTENDING    Ccs, Call [phone] ADMITTING    Ccs, Md SONOGRAPHER  Orvil Holmes  cc:  ------------------------------------------------------------------- LV EF: 55% -   60%  ------------------------------------------------------------------- Indications:      Bacteremia 790.7.  ------------------------------------------------------------------- History:   PMH:  Infection by Streptococcus, viridans group. Transient ischemic attack.  Risk factors:  Hypertension. Dyslipidemia.  ------------------------------------------------------------------- Study Conclusions  - Left ventricle: The cavity size was normal.  Wall thickness was normal. Systolic function was normal. The estimated ejection fraction was in the range of 55% to 60%. - Aortic valve: There was trivial regurgitation. - Mitral valve: There was mild regurgitation. - Right atrium: No evidence of thrombus in the atrial cavity or appendage. - Atrial septum: There was increased thickness of the septum, consistent with lipomatous hypertrophy. No defect or patent foramen ovale was identified.  Impressions:  - No evidence of endocarditis. There was no evidence of a vegetation.  ------------------------------------------------------------------- Study data:   Study status:  Routine.  Consent:  The risks, benefits, and alternatives to the procedure were explained to the patient and informed consent was obtained.  Procedure:  Initial setup. The patient was brought to the laboratory. Surface ECG leads were monitored. Sedation. Conscious sedation was administered. Transesophageal echocardiography. Topical anesthesia was obtained using viscous lidocaine . A transesophageal probe was inserted by the attending cardiologist. Image quality was adequate.  Study completion:  The patient tolerated the procedure well. There were no complications.  Administered medications:   Fentanyl , 100mcg. Midazolam , 5mg .          Diagnostic transesophageal echocardiography.  2D and color Doppler.  Birthdate:  Patient birthdate: 1951/03/01.  Age:  Patient is 73 yr old.  Sex:  Gender: female.  Blood pressure:     162/88  Patient status:  Inpatient. Study date:  Study date: 12/19/2015.  Study time: 02:58 PM. Location:  Endoscopy.  -------------------------------------------------------------------  ------------------------------------------------------------------- Left ventricle:  The cavity size was normal. Wall thickness was normal. Systolic function was normal. The estimated ejection fraction was in the range of 55% to  60%.  ------------------------------------------------------------------- Aortic valve:   Mildly thickened leaflets.  Doppler:  There was trivial regurgitation.  ------------------------------------------------------------------- Aorta:  The aorta was normal, not dilated, and non-diseased.  ------------------------------------------------------------------- Mitral valve:   Doppler:  There was mild regurgitation.  ------------------------------------------------------------------- Left atrium:  The atrium was normal in size.  ------------------------------------------------------------------- Atrial septum:  There was increased thickness of the septum, consistent with lipomatous hypertrophy. No defect or patent foramen ovale was identified.  ------------------------------------------------------------------- Right ventricle:  The cavity size was normal. Wall thickness was normal. Systolic function was normal.  ------------------------------------------------------------------- Pulmonic valve:    Doppler:  There was trivial regurgitation.  ------------------------------------------------------------------- Tricuspid valve:   Doppler:  There was mild regurgitation.  ------------------------------------------------------------------- Right atrium:  The atrium was normal in size.  No evidence of thrombus in the atrial cavity or appendage.  ------------------------------------------------------------------- Pericardium:  The pericardium was normal in appearance. There was no pericardial effusion.  ------------------------------------------------------------------- Post procedure conclusions Ascending Aorta:  - The aorta was normal, not dilated, and non-diseased.  ------------------------------------------------------------------- Prepared and Electronically Authenticated by  Maude Emmer, M.D. 2017-10-11T15:48:06         ______________________________________________________________________________________________        Risk Assessment/Calculations          Physical Exam VS:  BP (!) 104/56 (BP Location: Left Arm, Patient Position: Sitting, Cuff Size: Normal)   Pulse 64   Resp 16   Ht 5' 6 (1.676 m)   Wt 172 lb (78 kg)   SpO2 97%   BMI 27.76 kg/m        Wt Readings from Last 3 Encounters:  11/25/23 172 lb 6.4 oz (78.2 kg)  11/23/23 172 lb (78 kg)  10/21/23 174 lb 12.8 oz (79.3 kg)    GEN: Well nourished, well developed in no acute distress NECK: No JVD; No carotid bruits CARDIAC: RRR, no murmurs, rubs, gallops RESPIRATORY:  Clear to auscultation without rales, wheezing or rhonchi  ABDOMEN: Soft, non-tender, non-distended EXTREMITIES:  No edema; No deformity.  Right radial cath site with purple and yellow ecchymosis but no hematoma nor infection.  ASSESSMENT AND PLAN  CAD s/p DES-LAD 08/25/2023- Stable with no anginal symptoms. No indication for ischemic evaluation.  Continue DAPT Aspirin  81mg  daily and Plavix  75mg  daily for 1 year.  Additional GDMT includes Incliseron, Rosuvastatin  5mg  daily (intolerant of higher doses), Toprol  25mg  daily.   HLD, LDL goal from 55-continue rosuvastatin  5 mg daily and inclisiran. 08/25/23 LDL 32. Lp(a) unremarkable - no evidence of familial hyperlipidemia.   History of CVA-continue Plavix , statin.  HTN - Occasional lightheadedness with position changes, orthostatic precautions advised. Discussed to monitor BP at home at least 2 hours after medications and sitting for 5-10 minutes. Continue  Losartan  25mg  daily  CKD 3A - Careful titration of diuretic and antihypertensive.    OSA - CPAP compliance encouraged.   Chronic diastolic heart failure - Euvolemic and well compensated on exam. Has been takin gher Lasix  daily instead of PRN, will adjust to three times per week. Consider SGLT2i at follow up. Low sodium diet, fluid restriction <2L, and daily weights  encouraged. Educated to contact our office for weight gain of 2 lbs overnight or 5 lbs in one week.     Cardiac Rehabilitation Eligibility Assessment  Dispo: follow up in 2 months  Signed, Reche GORMAN Finder, NP

## 2023-11-23 NOTE — Patient Instructions (Signed)
 Medication Instructions:   CHANGE Furosmide to 40mg  three times per week instead of every day  *If you need a refill on your cardiac medications before your next appointment, please call your pharmacy*  Follow-Up: At [facility], you and your health needs are our priority.  As part of our continuing mission to provide you with exceptional heart care, our providers are all part of one team.  This team includes your primary Cardiologist (physician) and Advanced Practice Providers or APPs (Physician Assistants and Nurse Practitioners) who all work together to provide you with the care you need, when you need it.  Your next appointment:   2 month(s)  Provider:   Annabella Scarce, MD or Reche Finder, NP    We recommend signing up for the patient portal called MyChart.  Sign up information is provided on this After Visit Summary.  MyChart is used to connect with patients for Virtual Visits (Telemedicine).  Patients are able to view lab/test results, encounter notes, upcoming appointments, etc.  Non-urgent messages can be sent to your provider as well.   To learn more about what you can do with MyChart, go to [email or web address].   Other Instructions       Recommend checking with your primary care regarding labs for thyroid , vitamin D , CBC to assess for cause of fatigue.   You have been referred to cardiac rehab. This is a combination program including monitored exercise, dietary education, and support group. We strongly recommend participating in the program. Expect a phone call from them in approximately 1 week

## 2023-11-24 ENCOUNTER — Telehealth: Payer: Self-pay | Admitting: Family Medicine

## 2023-11-24 ENCOUNTER — Other Ambulatory Visit: Payer: Self-pay | Admitting: Family Medicine

## 2023-11-24 MED ORDER — UBRELVY 100 MG PO TABS
100.0000 mg | ORAL_TABLET | Freq: Every day | ORAL | 11 refills | Status: DC | PRN
Start: 2023-11-24 — End: 2024-01-06

## 2023-11-24 NOTE — Telephone Encounter (Signed)
 Called [facility] at  [phone]. They are closed for lunch until 2pm

## 2023-11-24 NOTE — Telephone Encounter (Addendum)
 Called pt at [phone]. LVM for her to call office.   Amy sent in prescription for Ubrelvy . I was going to discuss [employer] pt assistance for Ubrelvy . She can call them at [phone] to see about assistance for Ubrelvy  instead. We most likely will need to get approval for Ubrelvy  via insurance first.

## 2023-11-24 NOTE — Telephone Encounter (Signed)
 Called [facility] and spoke w/ tech. With insurance, cost 573.00. No PA needed. This is her copay, has deductible to meet first for prescription plan. This is why cost is higher right now.  Called [employer] patient assistance program at [phone]. Spoke w/ Luke.  They are requiring pt that are enrolling for assistance to enroll in [facility] prescription payment plan which would be 500.00 per monthx4 months. Pt have 2000.00 deductible to meet.   I called [employer] at [phone]. They mentioned [employer] who I spoke with already.   Copay card only for commercially insured patients.

## 2023-11-24 NOTE — Telephone Encounter (Signed)
 Patient said went to, pick up refill for Rimegepant Sulfate (NURTEC) 75 MG TBDP. Even with the Medicare discount cost would be $500. Want to know if there is any assistance to help with the cost. Would like a call back.

## 2023-11-25 ENCOUNTER — Telehealth (HOSPITAL_COMMUNITY): Payer: Self-pay

## 2023-11-25 ENCOUNTER — Other Ambulatory Visit: Payer: Self-pay | Admitting: Family Medicine

## 2023-11-25 ENCOUNTER — Ambulatory Visit (INDEPENDENT_AMBULATORY_CARE_PROVIDER_SITE_OTHER): Admitting: Family Medicine

## 2023-11-25 ENCOUNTER — Telehealth: Payer: Self-pay

## 2023-11-25 ENCOUNTER — Other Ambulatory Visit: Payer: Self-pay

## 2023-11-25 VITALS — BP 128/64 | HR 62 | Wt 172.4 lb

## 2023-11-25 DIAGNOSIS — R079 Chest pain, unspecified: Secondary | ICD-10-CM

## 2023-11-25 DIAGNOSIS — R600 Localized edema: Secondary | ICD-10-CM

## 2023-11-25 NOTE — Patient Instructions (Addendum)
 Thank you for visiting clinic today and allowing us  to participate in your care!  We are glad you are doing well overall! No medication changes today.   For your leg swelling, please try to rest and elevate your legs. You can use compression socks and over the counter steroid cream as needed for itchiness. You are scheudled to see our pharmacist Dr Koval next week for further testing.   12/10/2023  2:00 PM Koval, Peter G, RPH-CPP [facility]   Please follow up with cardiology as planned.   Please schedule a clinic appointment in 2-3 months for follow up.   Reach out any time with any questions or concerns you may have - we are here for you!  Damien Cassis, MD [facility] [phone]

## 2023-11-25 NOTE — Telephone Encounter (Signed)
 Called and spoke to pt and relayed information from mychart msg. Pt voiced gratitude and understanding in regards to completing the pt assistance application, new med, and was very appreciative to provider for being willing to switch since cost prohibitive.

## 2023-11-25 NOTE — Telephone Encounter (Signed)
 Please See Phone Note from 11/25/2023

## 2023-11-25 NOTE — Telephone Encounter (Signed)
 Attempted to call patient regarding cardiac rehab- no answer, unable to leave message. Sent MyChart message.

## 2023-11-25 NOTE — Telephone Encounter (Signed)
 Called pt and let her know that her Ubrelvy  wasn't covered by her insurance. Asked pt how often she uses her Rescue medication and she stated 1 per week.  Told pt that NP, Amy has 2 other options for her, but 1 of them won't be covered by her insurance due to it being a newer medication.   Looking up coupons for both Ubrelvy  and Nurtec and will send to Pt's MyChart.   Please see MyChart message

## 2023-11-25 NOTE — Progress Notes (Signed)
    SUBJECTIVE:   CHIEF COMPLAINT / HPI:   Chest pain  Edema  -Saw cardiology a couple days ago - they recommended taking lasix  MWF, patient has been taking lasix  every day due to feet swelling  -Leg swelling non painful - usually L > R -Occasional dizziness every once in a while - with position changes -No falls or LOC -Central chest pain and L axilla comes and goes - improves with rest - had some this morning - improving since visit - this pain has been ongoing since hospitalization in July and has remained stable   PERTINENT  PMH / PSH: CHF, CAD s/p stent, HTN, HLD, CVA, CKD3  OBJECTIVE:   BP 128/64   Pulse 62   Wt 172 lb 6.4 oz (78.2 kg)   SpO2 96%   BMI 27.83 kg/m    General: Well-appearing. Resting comfortably in room. CV: Normal S1/S2. No extra heart sounds. Warm and well-perfused. Pulm: Breathing comfortably on room air. CTAB. No crackles. No increased WOB. Abd: Soft, non-tender, non-distended. Skin:  Warm, dry. 1+ pitting BLE edema to level of mid-shin, nontender, distal pulses intact bilaterally. Dry, erythematous LLE with signs of venous stasis dermatitis, no active bleeding or draining.  Psych: Pleasant and appropriate.   ASSESSMENT/PLAN:   Assessment & Plan Chest pain, unspecified type Bilateral lower extremity edema EKG without acute ischemic changes today, similar to prior EKG. Suspect element of anginal chest pain following stent placement. Relatively euvolemic on exam today, suspect some of leg swelling due to venous stasis.  - Continue medications as is - continue to follow with cardiology as planned - Discussed supportive care for LE including compression, rest/elevation, OTC steroid cream as needed for itchiness - ABI testing with Dr Koval scheduled    RTC in 2-3 months for follow up.   Damien Cassis, MD [facility]

## 2023-11-25 NOTE — Telephone Encounter (Signed)
 Pt insurance is active and benefits verified through [employer] B. Co-pay $0, DED $257/$257 met, out of pocket $0/$0 met, co-insurance 20%. No pre-authorization required. [employer], 11/25/2023 @ 8:44am, REF# [id number].  2ndary insurance is active and benefits verified through [employer]. Co-pay $0, DED $0/$0 met, out of pocket $0/$0 met, co-insurance 0%. No pre-authorization required.   TCR/ICR? ICR Visit(date of service)limitation? No Can multiple codes be used on the same date of service/visit?(IF ITS A LIMIT) N/A  Is this a lifetime maximum or an annual maximum? Annual Has the member used any of these services to date? No Is there a time limit (weeks/months) on start of program and/or program completion? No

## 2023-12-09 ENCOUNTER — Telehealth (HOSPITAL_COMMUNITY): Payer: Self-pay

## 2023-12-09 NOTE — Telephone Encounter (Signed)
 Attempted f/u call regarding cardiac rehab- no answer, left message. Sent MyChart message.  Closing referral.

## 2023-12-10 ENCOUNTER — Encounter: Payer: Self-pay | Admitting: Pharmacist

## 2023-12-10 ENCOUNTER — Ambulatory Visit: Admitting: Pharmacist

## 2023-12-10 ENCOUNTER — Other Ambulatory Visit: Payer: Self-pay | Admitting: Family Medicine

## 2023-12-10 VITALS — BP 99/52 | HR 63 | Wt 166.6 lb

## 2023-12-10 DIAGNOSIS — M79605 Pain in left leg: Secondary | ICD-10-CM

## 2023-12-10 DIAGNOSIS — M79604 Pain in right leg: Secondary | ICD-10-CM | POA: Diagnosis not present

## 2023-12-10 DIAGNOSIS — M79606 Pain in leg, unspecified: Secondary | ICD-10-CM | POA: Insufficient documentation

## 2023-12-10 MED ORDER — EMGALITY 120 MG/ML ~~LOC~~ SOAJ: 120.0000 mg | SUBCUTANEOUS | 3 refills | Status: AC

## 2023-12-10 NOTE — Patient Instructions (Addendum)
 It was nice to see you today!   Your leg blood flow test was near normal today.    Medication Changes: Continue all other medication the same.   Keep up the good work with diet and exercise. Aim for a diet full of vegetables, fruit and lean meats (chicken, [place], fish). Try to limit salt intake by eating fresh or frozen vegetables (instead of canned), rinse canned vegetables prior to cooking and do not add any additional salt to meals.

## 2023-12-10 NOTE — Assessment & Plan Note (Signed)
 Patient found to have ABI of > 1 in both legs.   ABI in Right leg: 1.29  and ABI in Left leg:  1.24 She was NOT found to have any decrease in blood flow of lower limbs.  - Following discussion of today's results and family history, I encouraged patient to return to PCP for further discussion, work-up and management of her bilateral leg pains.  - Currently on all target therapy for PAD with exception of low dose rivaroxaban 

## 2023-12-10 NOTE — Progress Notes (Signed)
 S:     Chief Complaint  Patient presents with   Medication Management    Vacular Blood Flow - PAD Eval   73 y.o. female who presents for PAD evaluation, education, and management. Today, patient arrives in good spirits and presents without any assistance.  Patient was referred and last seen by Primary Care Provider, Dr. Diona, on 11/25/2023.  At last visit, patient had complaints of leg pain concerning for PAD.   PMH is significant for multiple complaints of leg pain symptoms including feeling cold, sharp stabbing pain in calf muscles intermittently and night time leg cramping.  Patient reports some symptoms are worse with exertion.      Patient reports history of CAD with stent.   Family/Social history: Father had bilateral lower leg amputation,  Brother had all toes removed on one foot due to gangrene.  Medication adherence reports as good. Currently taking dual antiplatelet therapy, cholesterol lowering medications, and reports tobacco cessation >10 years ago.   O:  Review of Systems  Neurological:  Positive for sensory change.  All other systems reviewed and are negative.   Physical Exam Vitals reviewed.  Pulmonary:     Effort: Pulmonary effort is normal.  Musculoskeletal:     Right lower leg: No edema.     Left lower leg: No edema.  Neurological:     Mental Status: She is alert.  Psychiatric:        Mood and Affect: Mood normal.        Behavior: Behavior normal.        Thought Content: Thought content normal.        Judgment: Judgment normal.    Bilateral ARM systolic readings 99  Right Posterior Tibial Pressure 128 with Blood Pressure monitor / 124 with vascular doppler Right Dorsalis Pedis Pressure 127 with Blood Pressure monitor / 120 with vascular doppler   Left Posterior Tibial Pressure 123 with Blood Pressure monitor / 118 with vascular doppler Left Dorsalis Pedis Pressure - unable to determine with vascular doppler   Last 3 Office BP readings: BP  Readings from Last 3 Encounters:  12/10/23 (!) 99/52  11/25/23 128/64  11/23/23 (!) 104/56    BMET    Component Value Date/Time   NA 143 10/21/2023 0948   K 4.3 10/21/2023 0948   CL 107 (H) 10/21/2023 0948   CO2 23 10/21/2023 0948   GLUCOSE 114 (H) 10/21/2023 0948   GLUCOSE 119 (H) 10/09/2023 0243   BUN 21 10/21/2023 0948   CREATININE 1.13 (H) 10/21/2023 0948   CREATININE 1.10 (H) 02/25/2016 0907   CALCIUM  8.7 10/21/2023 0948   GFRNONAA 53 (L) 10/09/2023 0243   GFRNONAA 53 (L) 02/25/2016 0907   GFRAA >60 08/29/2019 1030   GFRAA 61 02/25/2016 0907   Clinical ASCVD: Yes    A/P: Patient found to have ABI of > 1 in both legs.   ABI in Right leg: 1.29  and ABI in Left leg:  1.24 She was NOT found to have any decrease in blood flow of lower limbs.  - Following discussion of today's results and family history, I encouraged patient to return to PCP for further discussion, work-up and management of her bilateral leg pains.  - Currently on all target therapy for PAD with exception of low dose rivaroxaban   Written patient instructions provided. Patient verbalized understanding of treatment plan.  Total time in face to face counseling 32 minutes.    Follow-up:  Pharmacist None. PCP clinic visit in 12/24/2023.  Patient seen with Lawson Mao, PharmD Candidate - PY3 student.

## 2023-12-14 NOTE — Progress Notes (Signed)
 Reviewed and agree with Dr Rennis plan.

## 2023-12-15 NOTE — Telephone Encounter (Signed)
 Placed samples at there front desk.

## 2023-12-16 ENCOUNTER — Encounter: Payer: Self-pay | Admitting: Cardiovascular Disease

## 2023-12-16 ENCOUNTER — Telehealth: Payer: Self-pay

## 2023-12-16 ENCOUNTER — Other Ambulatory Visit (HOSPITAL_COMMUNITY): Payer: Self-pay

## 2023-12-16 MED ORDER — EMGALITY 120 MG/ML ~~LOC~~ SOAJ: 240.0000 mg | SUBCUTANEOUS | 0 refills | Status: AC

## 2023-12-16 NOTE — Telephone Encounter (Signed)
 I did not see an order for the loading dose-will PT need the loading dose?

## 2023-12-17 ENCOUNTER — Encounter: Payer: Self-pay | Admitting: Cardiovascular Disease

## 2023-12-17 ENCOUNTER — Other Ambulatory Visit (HOSPITAL_COMMUNITY): Payer: Self-pay

## 2023-12-17 NOTE — Telephone Encounter (Signed)
 Pharmacy Patient Advocate Encounter  Received notification from [employer] that Prior Authorization for Emgality has been APPROVED from 12/17/2023 to 03/09/2098. Ran test claim, Copay is $661.02. This test claim was processed through [facility]- copay amounts may vary at other pharmacies due to pharmacy/plan contracts, or as the patient moves through the different stages of their insurance plan.   PA #/Case ID/Reference #: [id number]     Keep in mind we have been told that for PAP PT has to have set up a payment plan thru their medicare plan before PAP can assist. Also PT has medicare so can not use a copay savings card either unfortunately. I would advise PT to call her plan to verify if they can tell her what might would be more affordable for her and then reach out to the provider to discuss.

## 2023-12-17 NOTE — Telephone Encounter (Signed)
 Pharmacy Patient Advocate Encounter   Received notification from Physician's Office that prior authorization for Emgality 120mg /ml autoinjector is required/requested.   Insurance verification completed.   The patient is insured through [facility].   Per test claim: PA required; PA submitted to above mentioned insurance via Latent Key/confirmation #/EOC [facility] Status is pending

## 2023-12-24 ENCOUNTER — Encounter: Payer: Self-pay | Admitting: Family Medicine

## 2023-12-24 ENCOUNTER — Ambulatory Visit: Admitting: Family Medicine

## 2024-01-05 NOTE — Progress Notes (Unsigned)
 [facility]  PATIENT: [name] DOB: [birth year]  REFERRING DOCTOR OR PCP:  Lauraine Molt, DO; Mahnoor Baloch, MD SOURCE: patient, notes from [facility], imaging and lab reports, MRI /CT images personally reviewed  _________________________________   HISTORICAL  CHIEF COMPLAINT:  Chief Complaint  Patient presents with   Follow-up    Pt in room 10. Alone. Here for Migraine follow up.    HISTORY OF PRESENT ILLNESS:  She is a 73 year old woman who has previously seen Dr. Jenel, Dr. Buck and Amy Lomax in our office for headaches and sleep apnea.  Her main problem is daily headache.  These are daily.  Pain is occipital/neck and radiates up.  Right > left.   Nurtec has helped but is not covered by insurance so she just takes sparingly.    She is also on zonisamide  100 mg po bid and trazodone  25-50 mg and tramadol  50 mg nightly.      Medications tried and failed: Botox (2 cycles ineffective), Ajovy  (ineffective), topiramate , zonisamide  (on now), trazodone  (on now), sertraline  (on now), gabapentin  (on now), propranolol , Nurtec, tramadol  (takes at night), tizanidine , ondansetron , cyclobenzaprine , metoclopramide , triptans contraindicated due to PVD, CVA and TIA history   OSA history:   HST showed moderate OSA with total AHI of 27.6/hr and O2 nadir of 86%.  She was placed on AutoPap   Imaging: CT scan of the head 10/08/2023 appeared normal.  No significant atrophy.  No significant white matter changes.  Cervicomedullary junction appears normal.  MRI of the cervical spine 05/01/2020 shows ACDF at C4-C5.  There is adjacent level disease at C3-C4 where there is severe spinal stenosis and severe foraminal narrowing.  On the sagittal STIR images, there appears to be hyperintense signal adjacent to C3-C4 though this is not confirmed on the axial views there was moderate spinal stenosis at C2-C3 and C5-C6  REVIEW OF SYSTEMS: Constitutional: No fevers, chills, sweats, or change  in appetite Eyes: No visual changes, double vision, eye pain Ear, nose and throat: No hearing loss, ear pain, nasal congestion, sore throat Cardiovascular: No chest pain, palpitations Respiratory:  No shortness of breath at rest or with exertion.   No wheezes GastrointestinaI: No nausea, vomiting, diarrhea, abdominal pain, fecal incontinence Genitourinary:  No dysuria, urinary retention or frequency.  No nocturia. Musculoskeletal:  No neck pain, back pain Integumentary: No rash, pruritus, skin lesions Neurological: as above Psychiatric: No depression at this time.  No anxiety Endocrine: No palpitations, diaphoresis, change in appetite, change in weigh or increased thirst Hematologic/Lymphatic:  No anemia, purpura, petechiae. Allergic/Immunologic: No itchy/runny eyes, nasal congestion, recent allergic reactions, rashes  ALLERGIES: Allergies  Allergen Reactions   Iodine Hives and Itching    With MRI scan   Shellfish Allergy Anaphylaxis    All seafood   Crestor  [Rosuvastatin ] Itching and Other (See Comments)    Anxiety, headaches, myalgias Able to tolerate 5mg  daily   Lipitor [Atorvastatin ] Other (See Comments)    Bad headaches    HOME MEDICATIONS:  Current Outpatient Medications:    albuterol  (VENTOLIN  HFA) 108 (90 Base) MCG/ACT inhaler, Inhale 2 puffs into the lungs every 6 (six) hours as needed for wheezing or shortness of breath., Disp: 8 g, Rfl: 6   aspirin  EC 81 MG tablet, Take 1 tablet (81 mg total) by mouth daily. Swallow whole., Disp: 30 tablet, Rfl: 1   BIOTIN PO, Take 1 capsule by mouth daily. Dosage unknown, Disp: , Rfl:    CALCIUM  PO, Take 1 tablet by mouth  daily. Dosage unknown, Disp: , Rfl:    cetirizine  (ZYRTEC ) 10 MG tablet, Take 1 tablet (10 mg total) by mouth daily with breakfast., Disp: 30 tablet, Rfl: 0   CHOLECALCIFEROL PO, Take 1 tablet by mouth daily. Dosage unknown, Disp: , Rfl:    clopidogrel  (PLAVIX ) 75 MG tablet, TAKE 1 TABLET BY MOUTH EVERY DAY, Disp:  90 tablet, Rfl: 3   fluticasone  (FLONASE ) 50 MCG/ACT nasal spray, INSTILL 1 SPRAY INTO BOTH NOSTRILS DAILY, Disp: 16 mL, Rfl: 2   furosemide  (LASIX ) 40 MG tablet, Take one tablet three times per week on Mondays, Wednesdays, Fridays. May take an additional tablet as needed for weight gain greater than 3lbs in one day or 5lbs in a week., Disp: 30 tablet, Rfl: 2   gabapentin  (NEURONTIN ) 600 MG tablet, TAKE 1 TABLET BY MOUTH THREE TIMES A DAY, Disp: 90 tablet, Rfl: 3   Galcanezumab-gnlm (EMGALITY) 120 MG/ML SOAJ, Inject 120 mg into the skin every 30 (thirty) days., Disp: 3 mL, Rfl: 3   imipramine (TOFRANIL) 25 MG tablet, Take 1 tablet (25 mg total) by mouth at bedtime., Disp: 30 tablet, Rfl: 11   inclisiran (LEQVIO ) 284 MG/1.5ML SOSY injection, Inject as directed, Disp: 1.5 mL, Rfl: 0   losartan  (COZAAR ) 25 MG tablet, TAKE 1 TABLET (25 MG TOTAL) BY MOUTH DAILY., Disp: 90 tablet, Rfl: 0   metoprolol  succinate (TOPROL -XL) 25 MG 24 hr tablet, Take 1 tablet (25 mg total) by mouth daily., Disp: 90 tablet, Rfl: 1   ondansetron  (ZOFRAN ) 4 MG tablet, Take 1 tablet (4 mg total) by mouth every 8 (eight) hours as needed for nausea or vomiting., Disp: 20 tablet, Rfl: 0   pantoprazole  (PROTONIX ) 40 MG tablet, TAKE 1 TABLET BY MOUTH TWICE A DAY, Disp: 180 tablet, Rfl: 1   Rimegepant Sulfate (NURTEC) 75 MG TBDP, Take 1 tablet (75 mg total) by mouth daily as needed (take for abortive therapy of migraine, no more than 1 tablet in 24 hours or 10 per month)., Disp: 8 tablet, Rfl: 11   rosuvastatin  (CRESTOR ) 5 MG tablet, TAKE 1 TABLET (5 MG TOTAL) BY MOUTH DAILY., Disp: 90 tablet, Rfl: 3   sertraline  (ZOLOFT ) 100 MG tablet, TAKE 1 TABLET BY MOUTH EVERY DAY, Disp: 90 tablet, Rfl: 3   traMADol  (ULTRAM ) 50 MG tablet, TAKE 1 TABLET EVERY 8 HOURS AS NEEDED FOR MODERATE PAIN, Disp: 90 tablet, Rfl: 5   zonisamide  (ZONEGRAN ) 100 MG capsule, Take 1 capsule (100 mg total) by mouth 2 (two) times daily., Disp: 180 capsule, Rfl:  3  PAST MEDICAL HISTORY: Past Medical History:  Diagnosis Date   Anemia    Cervical neuritis    Chronic kidney disease    stage 3   Dyslipidemia    GERD (gastroesophageal reflux disease)    History of transient ischemic attack (TIA)    HTN (hypertension)    Hyperlipidemia    Migraine    Migraine headache    Occipital neuralgia    Left   Pulmonary embolism (HCC) 2017   Renal insufficiency    Stroke (HCC) 10/2013    PAST SURGICAL HISTORY: Past Surgical History:  Procedure Laterality Date   ABDOMINAL HYSTERECTOMY     ANTERIOR CERVICAL DECOMP/DISCECTOMY FUSION N/A 05/24/2020   Procedure: ANTERIOR CERVICAL DECOMPRESSION/DISCECTOMY FUSION 1 LEVEL C3-4;  Surgeon: Burnetta Aures, MD;  Location: [facility] OR;  Service: Orthopedics;  Laterality: N/A;  3 hrs   BILIARY STENT PLACEMENT N/A 12/12/2015   Procedure: BILIARY STENT PLACEMENT;  Surgeon: Victory LITTIE Brand  III, MD;  Location: [facility] ENDOSCOPY;  Service: Endoscopy;  Laterality: N/A;   BLADDER REPAIR     CARPAL TUNNEL RELEASE     CERVICAL FUSION     CERVICAL SPINE SURGERY     CHOLECYSTECTOMY N/A 12/06/2015   Procedure: LAPAROSCOPIC CHOLECYSTECTOMY WITH  INTRAOPERATIVE CHOLANGIOGRAM;  Surgeon: Jina Nephew, MD;  Location: [facility] OR;  Service: General;  Laterality: N/A;   CORONARY PRESSURE/FFR STUDY N/A 08/25/2023   Procedure: CORONARY PRESSURE/FFR STUDY;  Surgeon: Jordan, Peter M, MD;  Location: [facility] INVASIVE CV LAB;  Service: Cardiovascular;  Laterality: N/A;   CORONARY STENT INTERVENTION N/A 08/25/2023   Procedure: CORONARY STENT INTERVENTION;  Surgeon: Jordan, Peter M, MD;  [facility] INVASIVE CV LAB;  Service: Cardiovascular;  Laterality: N/A;   ELBOW SURGERY     ERCP N/A 12/12/2015   Procedure: ENDOSCOPIC RETROGRADE CHOLANGIOPANCREATOGRAPHY (ERCP);  Surgeon: Victory LITTIE Legrand DOUGLAS, MD;  Location: [facility] ENDOSCOPY;  Service: Endoscopy;  Laterality: N/A;   ESOPHAGOGASTRODUODENOSCOPY N/A 01/28/2016   Procedure: ESOPHAGOGASTRODUODENOSCOPY (EGD) Biliary STENT  removal;  Surgeon: Victory LITTIE Legrand DOUGLAS, MD;  Location: [facility] ENDOSCOPY;  Service: Gastroenterology;  Laterality: N/A;   IR GENERIC HISTORICAL  12/17/2015   IR US  GUIDE BX ASP/DRAIN 12/17/2015 [facility]   IR GENERIC HISTORICAL  12/17/2015   IR GUIDED DRAIN W CATHETER PLACEMENT 12/17/2015 [facility]   IR GENERIC HISTORICAL  12/17/2015   IR SINUS/FIST TUBE CHK-NON GI 12/17/2015 [facility]   KNEE SURGERY     LEFT HEART CATH AND CORONARY ANGIOGRAPHY N/A 08/25/2023   Procedure: LEFT HEART CATH AND CORONARY ANGIOGRAPHY;  Surgeon: Jordan, Peter M, MD;  Location: [facility] INVASIVE CV LAB;  Service: Cardiovascular;  Laterality: N/A;   LUNG SURGERY     TEE WITHOUT CARDIOVERSION N/A 12/19/2015   Procedure: TRANSESOPHAGEAL ECHOCARDIOGRAM (TEE);  Surgeon: Maude JAYSON Emmer, MD;  Location: [facility] ENDOSCOPY;  Service: Cardiovascular;  Laterality: N/A;    FAMILY HISTORY: Family History  Problem Relation Age of Onset   Cancer Mother 47       breast   Alzheimer's disease Mother    Prostate cancer Father    Lung cancer Father    Cancer Brother    Hyperlipidemia Brother    Sleep apnea Brother    Cancer Brother    Hyperlipidemia Brother    Dementia Brother        frontotemporal dementia   Dementia Maternal Grandmother    Cancer Maternal Grandfather     SOCIAL HISTORY: Social History   Socioeconomic History   Marital status: Married    Spouse name: [name]   Number of children: 1   Years of education: 12+   Highest education level: Associate degree: academic program  Occupational History   Occupation: ACTIVITY DIRECTOR    Employer: [employer]   Occupation: CNA   Occupation: Retired  Tobacco Use   Smoking status: Former    Current packs/day: 0.00    Average packs/day: 1.5 packs/day for 42.6 years (64.0 ttl pk-yrs)    Types: Cigarettes    Start date: 03/10/1969    Quit date: 11/01/2011    Years since quitting: 12.1   Smokeless tobacco: Never   Tobacco comments:    Smoked 1.5-2 ppd for adult life  (salem menthols)  Vaping Use   Vaping status: Never Used  Substance and Sexual Activity   Alcohol use: No    Alcohol/week: 0.0 standard drinks of alcohol   Drug use: No   Sexual activity: Yes    Partners: Male  Birth control/protection: Surgical    Comment: 1 sexual partner in last 12 months: married  Other Topics Concern   Not on file  Social History Narrative    Patient is married ([name]).   Patient has one child and grandchild. Her sons name is Alm. Patient sees them often.   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 Patient spends 6-10 hours with her 6 days a week.    Patient has a college education.   Patient is very active in her church.    Hobbies- outside crafts and church choir.   Social Drivers of Corporate Investment Banker Strain: Low Risk  (12/21/2023)   Overall Financial Resource Strain (CARDIA)    Difficulty of Paying Living Expenses: Not hard at all  Food Insecurity: No Food Insecurity (12/21/2023)   Hunger Vital Sign    Worried About Running Out of Food in the Last Year: Never true    Ran Out of Food in the Last Year: Never true  Transportation Needs: No Transportation Needs (12/21/2023)   PRAPARE - Administrator, Civil Service (Medical): No    Lack of Transportation (Non-Medical): No  Physical Activity: Sufficiently Active (08/21/2023)   Exercise Vital Sign    Days of Exercise per Week: 3 days    Minutes of Exercise per Session: 50 min  Recent Concern: Physical Activity - Insufficiently Active (07/17/2023)   Exercise Vital Sign    Days of Exercise per Week: 4 days    Minutes of Exercise per Session: 30 min  Stress: Stress Concern Present (08/21/2023)   [employer] of Occupational Health - Occupational Stress Questionnaire    Feeling of Stress: Very much  Social Connections: Socially Isolated (12/21/2023)   Social Connection and Isolation Panel    Frequency of Communication with Friends and  Family: Once a week    Frequency of Social Gatherings with Friends and Family: Never    Attends Religious Services: 1 to 4 times per year    Active Member of [employer] or Organizations: No    Attends Banker Meetings: Not on file    Marital Status: Widowed  Intimate Partner Violence: Not At Risk (10/08/2023)   Humiliation, Afraid, Rape, and Kick questionnaire    Fear of Current or Ex-Partner: No    Emotionally Abused: No    Physically Abused: No    Sexually Abused: No       PHYSICAL EXAM  Vitals:   01/06/24 0814  BP: (!) 140/84  Pulse: 67  SpO2: 98%  Weight: 168 lb 8 oz (76.4 kg)  Height: 5' 6 (1.676 m)    Body mass index is 27.2 kg/m.   General: The patient is well-developed and well-nourished and in no acute distress  HEENT:  Head is [place]/AT.  Sclera are anicteric.  Funduscopic exam shows normal optic discs and retinal vessels.  Neck: Bilateral carotid bruits are noted.  The neck is tender over occiput (R>L) and paraspinal muscles (R>>L).  Reduced ROM  Cardiovascular: The heart has a regular rate and rhythm with a normal S1 and S2. There were no murmurs, gallops or rubs.    Skin: Extremities are without rash , mild bruising  Musculoskeletal:  Back is nontender  Neurologic Exam  Mental status: The patient is alert and oriented x 3 at the time of the examination. The patient has apparent normal recent and remote memory, with an apparently normal attention span and concentration ability.  Speech is normal.  Cranial nerves: Extraocular movements are full. Pupils are equal, round, and reactive to light and accomodation.  T here is good facial sensation to soft touch bilaterally.Facial strength is normal.  Trapezius and sternocleidomastoid strength is normal. No dysarthria is noted.  The tongue is midline, and the patient has symmetric elevation of the soft palate. No obvious hearing deficits are noted.  Motor:  Muscle bulk is normal.   Tone is normal. Strength  is  5 / 5 in all 4 extremities.   Sensory: Sensory testing is intact to pinprick, soft touch and vibration sensation in all 4 extremities.  Coordination: Cerebellar testing reveals good finger-nose-finger and heel-to-shin bilaterally.  Gait and station: Station is normal.   Gait is normal. Tandem gait is normal for age. Romberg is negative.   Reflexes: Deep tendon reflexes are symmetric and normal in arms, 3+ at knees (L>R) and 2 beats left ankle clonus.SABRA       DIAGNOSTIC DATA (LABS, IMAGING, TESTING) - I reviewed patient records, labs, notes, testing and imaging myself where available.  Lab Results  Component Value Date   WBC 11.5 (H) 10/09/2023   HGB 13.9 10/09/2023   HCT 42.1 10/09/2023   MCV 87.5 10/09/2023   PLT 327 10/09/2023      Component Value Date/Time   NA 143 10/21/2023 0948   K 4.3 10/21/2023 0948   CL 107 (H) 10/21/2023 0948   CO2 23 10/21/2023 0948   GLUCOSE 114 (H) 10/21/2023 0948   GLUCOSE 119 (H) 10/09/2023 0243   BUN 21 10/21/2023 0948   CREATININE 1.13 (H) 10/21/2023 0948   CREATININE 1.10 (H) 02/25/2016 0907   CALCIUM  8.7 10/21/2023 0948   PROT 6.5 10/07/2023 1403   PROT 6.6 02/03/2023 1636   ALBUMIN  3.9 10/07/2023 1403   ALBUMIN  4.8 02/03/2023 1636   AST 25 10/07/2023 1403   ALT 18 10/07/2023 1403   ALKPHOS 97 10/07/2023 1403   BILITOT 0.7 10/07/2023 1403   BILITOT 0.3 02/03/2023 1636   GFRNONAA 53 (L) 10/09/2023 0243   GFRNONAA 53 (L) 02/25/2016 0907   GFRAA >60 08/29/2019 1030   GFRAA 61 02/25/2016 0907   Lab Results  Component Value Date   CHOL 108 08/25/2023   HDL 67 08/25/2023   LDLCALC 32 08/25/2023   LDLDIRECT 156 (H) 03/27/2017   TRIG 44 08/25/2023   CHOLHDL 1.6 08/25/2023   Lab Results  Component Value Date   HGBA1C 5.5 07/30/2017   Lab Results  Component Value Date   VITAMINB12 290 07/21/2017   Lab Results  Component Value Date   TSH 1.610 11/03/2018       ASSESSMENT AND PLAN  Migraine without aura and without  status migrainosus, not intractable  Chronic tension-type headache, intractable - Plan: betamethasone  acetate-betamethasone  sodium phosphate  (CELESTONE ) injection 12 mg  Neck pain  Insomnia, unspecified type  Chronic daily headache  Bilateral carotid artery stenosis    TPI bilateral splenius capitus and right C6C& and trapezius muscles with 12 mg Celestone  in Marcaine  using sterile technique Imipramine 25 mg (to help HA and sleep).   D/c trazodone  If we can get Nurtec/Ubrelvy  at a reasonable price for her or if she qualifies for one of the injectable CGRP agents, we will prescribe those.   Return in 6 months or sooner for new or worsening neurologic symptoms.   Lorilynn Lehr A. Vear, MD, [facility] 01/06/2024, 9:49 AM Certified in Neurology, Clinical Neurophysiology, Sleep Medicine and Neuroimaging  [facility] [address] [place], [place] [address] [phone]

## 2024-01-06 ENCOUNTER — Ambulatory Visit (INDEPENDENT_AMBULATORY_CARE_PROVIDER_SITE_OTHER): Admitting: Neurology

## 2024-01-06 ENCOUNTER — Encounter: Payer: Self-pay | Admitting: Neurology

## 2024-01-06 VITALS — BP 140/84 | HR 67 | Ht 66.0 in | Wt 168.5 lb

## 2024-01-06 DIAGNOSIS — G44221 Chronic tension-type headache, intractable: Secondary | ICD-10-CM | POA: Diagnosis not present

## 2024-01-06 DIAGNOSIS — G4700 Insomnia, unspecified: Secondary | ICD-10-CM

## 2024-01-06 DIAGNOSIS — M542 Cervicalgia: Secondary | ICD-10-CM

## 2024-01-06 DIAGNOSIS — G43009 Migraine without aura, not intractable, without status migrainosus: Secondary | ICD-10-CM

## 2024-01-06 DIAGNOSIS — R519 Headache, unspecified: Secondary | ICD-10-CM

## 2024-01-06 DIAGNOSIS — I6523 Occlusion and stenosis of bilateral carotid arteries: Secondary | ICD-10-CM | POA: Diagnosis not present

## 2024-01-06 MED ORDER — BETAMETHASONE SOD PHOS & ACET 6 (3-3) MG/ML IJ SUSP
12.0000 mg | Freq: Once | INTRAMUSCULAR | Status: AC
  Administered 2024-01-06: 12 mg via INTRAMUSCULAR

## 2024-01-06 MED ORDER — IMIPRAMINE HCL 25 MG PO TABS: 25.0000 mg | ORAL_TABLET | Freq: Every day | ORAL | 11 refills | Status: AC

## 2024-01-14 NOTE — Telephone Encounter (Signed)
 Emgality paperwork has been started and will be mailed out either tomorrow or Monday, however, Pfizer patient assistance requires the patient to enroll in medicare payment program prior to applying.  Apologies if routed incorrectly, was unsure who best office contact would be. Thank you

## 2024-01-22 ENCOUNTER — Ambulatory Visit: Admitting: Family Medicine

## 2024-01-22 ENCOUNTER — Encounter: Payer: Self-pay | Admitting: Family Medicine

## 2024-01-22 VITALS — BP 130/66 | HR 62 | Ht 66.0 in | Wt 171.8 lb

## 2024-01-22 DIAGNOSIS — R7309 Other abnormal glucose: Secondary | ICD-10-CM

## 2024-01-22 DIAGNOSIS — I2510 Atherosclerotic heart disease of native coronary artery without angina pectoris: Secondary | ICD-10-CM

## 2024-01-22 DIAGNOSIS — R5383 Other fatigue: Secondary | ICD-10-CM

## 2024-01-22 LAB — POCT GLYCOSYLATED HEMOGLOBIN (HGB A1C): Hemoglobin A1C: 5.6 % (ref 4.0–5.6)

## 2024-01-22 NOTE — Patient Instructions (Addendum)
 It was wonderful to see you today.  Please bring ALL of your medications with you to every visit.   Today we talked about:  I am getting some lab work to get to the bottom of this fatigue! I sent another referral for cardiology   Thank you for choosing [facility].   Please call [phone] with any questions about today's appointment.  Please arrive at least 15 minutes prior to your scheduled appointments.   If you had blood work today, I will send you a MyChart message or a letter if results are normal. Otherwise, I will give you a call.   If you had a referral placed, they will call you to set up an appointment. Please give us  a call if you don't hear back in the next 2 weeks.   If you need additional refills before your next appointment, please call your pharmacy first.   Do you need your medications delivered to your home?   We'll send your prescription to the [facility] for delivery.          Address: [address] [place], [place], [place] [address]          Phone: [phone]  Please call the [facility] to speak with a pharmacist and set up your home medication delivery. If you have any questions, feel free to contact us  -- we're happy to help!  Other  Pharmacies that offer affordable prices on both prescriptions and over-the-counter items, as well as convenient services like vaccinations, are  [facility], at [facility]         Address:  [address], [place], [place] [address]         Phone: [phone]  [facility], located in the [facility]        Address: [address], [place], [place] [address]        Phone: [phone]  [facility], at [facility]       Address: [address], [place], [place] [address]       Phone: [phone]  [facility], at [facility]       Address: [address] Floor, [place], [place] [address]       Phone: [phone]  You should follow up in our clinic in No follow-ups on file.  Gloriann Ogren, MD Family Medicine

## 2024-01-22 NOTE — Assessment & Plan Note (Signed)
 Patient currently on plavix  75mg , losartan  25mg , toprol  xl 25mg , crestor  5mg , lasix  40mg  prn. Encouraged patient to touch base with current cardiologist, but will place referral for second opinion.

## 2024-01-22 NOTE — Progress Notes (Signed)
    SUBJECTIVE:   CHIEF COMPLAINT / HPI:   [name] is a 73 yo F with PMHx of CHF, HTN, CAD s/p stent placement 08/2023 who presents for lower extremity pain. Initial concern was PAD, though Patient found to have ABI of > 1 in both legs. ABI in Right leg: 1.29 and ABI in Left leg: 1.24   Feeling really tired and run down since the stent placement. Felt well right after stent, but after 2-3 weeks, feels tired all the time. Occasionally has mild leg swelling, for which she takes prn lasix  with improvement. Denies any chest pain. Went to see cardiology and reports they did nothing for her when she stated her concerns. Would like to see another cardiologist for second opinion. Feels leg pain is improved from prior.    OBJECTIVE:   BP 130/66   Pulse 62   Ht 5' 6 (1.676 m)   Wt 171 lb 12.8 oz (77.9 kg)   SpO2 96%   BMI 27.73 kg/m   General: A&O, NAD HEENT: No sign of trauma, EOM grossly intact Cardiac: RRR, no m/r/g Respiratory: CTAB, normal WOB, no w/c/r GI: Soft, NTTP, minimal bilateral edema . Neuro: Normal gait, moves all four extremities appropriately. Psych: Appropriate mood and affect   ASSESSMENT/PLAN:   Assessment & Plan Abnormal glucose A1C 5.6 today  Other fatigue Recent echo and EKG wnl. Will start with TSH, CBC, CMP, B12 to rule out other causes of fatigue. Feel there may be a component of exertionally fatigue 2/2 to deconditioning after stent placement.  Patient was referred to cardiac rehab, but did not attend. Encouraged her to call them again.  Coronary artery disease involving native heart, unspecified vessel or lesion type, unspecified whether angina present Patient currently on plavix  75mg , losartan  25mg , toprol  xl 25mg , crestor  5mg , lasix  40mg  prn. Encouraged patient to touch base with current cardiologist, but will place referral for second opinion.      Gloriann Ogren, MD [facility]

## 2024-01-22 NOTE — Assessment & Plan Note (Signed)
 Recent echo and EKG wnl. Will start with TSH, CBC, CMP, B12 to rule out other causes of fatigue. Feel there may be a component of exertionally fatigue 2/2 to deconditioning after stent placement.  Patient was referred to cardiac rehab, but did not attend. Encouraged her to call them again.

## 2024-01-23 LAB — COMPREHENSIVE METABOLIC PANEL WITH GFR
ALT: 13 IU/L (ref 0–32)
AST: 19 IU/L (ref 0–40)
Albumin: 4.3 g/dL (ref 3.8–4.8)
Alkaline Phosphatase: 93 IU/L (ref 49–135)
BUN/Creatinine Ratio: 13 (ref 12–28)
BUN: 16 mg/dL (ref 8–27)
Bilirubin Total: 0.3 mg/dL (ref 0.0–1.2)
CO2: 28 mmol/L (ref 20–29)
Calcium: 9 mg/dL (ref 8.7–10.3)
Chloride: 103 mmol/L (ref 96–106)
Creatinine, Ser: 1.24 mg/dL — ABNORMAL HIGH (ref 0.57–1.00)
Globulin, Total: 1.8 g/dL (ref 1.5–4.5)
Glucose: 90 mg/dL (ref 70–99)
Potassium: 3.6 mmol/L (ref 3.5–5.2)
Sodium: 144 mmol/L (ref 134–144)
Total Protein: 6.1 g/dL (ref 6.0–8.5)
eGFR: 46 mL/min/1.73 — ABNORMAL LOW (ref >59)

## 2024-01-23 LAB — CBC WITH DIFFERENTIAL/PLATELET
Basophils Absolute: 0.1 x10E3/uL (ref 0.0–0.2)
Basos: 1 %
EOS (ABSOLUTE): 0.2 x10E3/uL (ref 0.0–0.4)
Eos: 2 %
Hematocrit: 38.8 % (ref 34.0–46.6)
Hemoglobin: 12.3 g/dL (ref 11.1–15.9)
Immature Grans (Abs): 0 x10E3/uL (ref 0.0–0.1)
Immature Granulocytes: 0 %
Lymphocytes Absolute: 1.6 x10E3/uL (ref 0.7–3.1)
Lymphs: 21 %
MCH: 28.5 pg (ref 26.6–33.0)
MCHC: 31.7 g/dL (ref 31.5–35.7)
MCV: 90 fL (ref 79–97)
Monocytes Absolute: 0.5 x10E3/uL (ref 0.1–0.9)
Monocytes: 6 %
Neutrophils Absolute: 5.3 x10E3/uL (ref 1.4–7.0)
Neutrophils: 70 %
Platelets: 199 x10E3/uL (ref 150–450)
RBC: 4.32 x10E6/uL (ref 3.77–5.28)
RDW: 13.8 % (ref 11.7–15.4)
WBC: 7.6 x10E3/uL (ref 3.4–10.8)

## 2024-01-23 LAB — TSH: TSH: 1.68 u[IU]/mL (ref 0.450–4.500)

## 2024-01-23 LAB — VITAMIN B12: Vitamin B-12: 311 pg/mL (ref 232–1245)

## 2024-01-25 ENCOUNTER — Ambulatory Visit: Payer: Self-pay | Admitting: Family Medicine

## 2024-01-26 ENCOUNTER — Ambulatory Visit (HOSPITAL_BASED_OUTPATIENT_CLINIC_OR_DEPARTMENT_OTHER): Admitting: Family

## 2024-02-06 ENCOUNTER — Other Ambulatory Visit: Payer: Self-pay | Admitting: Family Medicine

## 2024-02-08 ENCOUNTER — Other Ambulatory Visit: Payer: Self-pay

## 2024-02-08 ENCOUNTER — Observation Stay (HOSPITAL_COMMUNITY)
Admission: EM | Admit: 2024-02-08 | Discharge: 2024-02-10 | Disposition: A | Attending: Emergency Medicine | Admitting: Emergency Medicine

## 2024-02-08 ENCOUNTER — Ambulatory Visit: Admitting: Family Medicine

## 2024-02-08 ENCOUNTER — Encounter: Payer: Self-pay | Admitting: Family Medicine

## 2024-02-08 ENCOUNTER — Emergency Department (HOSPITAL_COMMUNITY)

## 2024-02-08 ENCOUNTER — Encounter (HOSPITAL_COMMUNITY): Payer: Self-pay | Admitting: Radiology

## 2024-02-08 VITALS — BP 126/62 | HR 72 | Wt 170.0 lb

## 2024-02-08 DIAGNOSIS — I25110 Atherosclerotic heart disease of native coronary artery with unstable angina pectoris: Principal | ICD-10-CM | POA: Insufficient documentation

## 2024-02-08 DIAGNOSIS — E785 Hyperlipidemia, unspecified: Secondary | ICD-10-CM | POA: Insufficient documentation

## 2024-02-08 DIAGNOSIS — R079 Chest pain, unspecified: Secondary | ICD-10-CM | POA: Diagnosis present

## 2024-02-08 DIAGNOSIS — Z87891 Personal history of nicotine dependence: Secondary | ICD-10-CM | POA: Insufficient documentation

## 2024-02-08 DIAGNOSIS — I5032 Chronic diastolic (congestive) heart failure: Secondary | ICD-10-CM | POA: Insufficient documentation

## 2024-02-08 DIAGNOSIS — Z8679 Personal history of other diseases of the circulatory system: Secondary | ICD-10-CM

## 2024-02-08 DIAGNOSIS — N1832 Chronic kidney disease, stage 3b: Secondary | ICD-10-CM | POA: Diagnosis not present

## 2024-02-08 DIAGNOSIS — R0602 Shortness of breath: Secondary | ICD-10-CM | POA: Diagnosis present

## 2024-02-08 DIAGNOSIS — I130 Hypertensive heart and chronic kidney disease with heart failure and stage 1 through stage 4 chronic kidney disease, or unspecified chronic kidney disease: Secondary | ICD-10-CM | POA: Insufficient documentation

## 2024-02-08 DIAGNOSIS — K219 Gastro-esophageal reflux disease without esophagitis: Secondary | ICD-10-CM | POA: Diagnosis present

## 2024-02-08 DIAGNOSIS — I200 Unstable angina: Principal | ICD-10-CM

## 2024-02-08 DIAGNOSIS — J309 Allergic rhinitis, unspecified: Secondary | ICD-10-CM | POA: Diagnosis not present

## 2024-02-08 DIAGNOSIS — E782 Mixed hyperlipidemia: Secondary | ICD-10-CM | POA: Diagnosis not present

## 2024-02-08 DIAGNOSIS — N183 Chronic kidney disease, stage 3 unspecified: Secondary | ICD-10-CM | POA: Insufficient documentation

## 2024-02-08 DIAGNOSIS — J301 Allergic rhinitis due to pollen: Secondary | ICD-10-CM | POA: Diagnosis not present

## 2024-02-08 DIAGNOSIS — Z7982 Long term (current) use of aspirin: Secondary | ICD-10-CM | POA: Insufficient documentation

## 2024-02-08 LAB — BASIC METABOLIC PANEL WITH GFR
Anion gap: 12 (ref 5–15)
BUN: 22 mg/dL (ref 8–23)
CO2: 25 mmol/L (ref 22–32)
Calcium: 8.5 mg/dL — ABNORMAL LOW (ref 8.9–10.3)
Chloride: 102 mmol/L (ref 98–111)
Creatinine, Ser: 1.36 mg/dL — ABNORMAL HIGH (ref 0.44–1.00)
GFR, Estimated: 41 mL/min — ABNORMAL LOW (ref >60)
Glucose, Bld: 91 mg/dL (ref 70–99)
Potassium: 3.5 mmol/L (ref 3.5–5.1)
Sodium: 139 mmol/L (ref 135–145)

## 2024-02-08 LAB — CBC
HCT: 37.8 % (ref 36.0–46.0)
Hemoglobin: 12.2 g/dL (ref 12.0–15.0)
MCH: 28.3 pg (ref 26.0–34.0)
MCHC: 32.3 g/dL (ref 30.0–36.0)
MCV: 87.7 fL (ref 80.0–100.0)
Platelets: 209 K/uL (ref 150–400)
RBC: 4.31 MIL/uL (ref 3.87–5.11)
RDW: 14 % (ref 11.5–15.5)
WBC: 6.6 K/uL (ref 4.0–10.5)
nRBC: 0 % (ref 0.0–0.2)

## 2024-02-08 LAB — TROPONIN I (HIGH SENSITIVITY)
Troponin I (High Sensitivity): 7 ng/L (ref <18)
Troponin I (High Sensitivity): 7 ng/L (ref <18)

## 2024-02-08 LAB — BRAIN NATRIURETIC PEPTIDE: B Natriuretic Peptide: 65.2 pg/mL (ref 0.0–100.0)

## 2024-02-08 MED ORDER — NITROGLYCERIN 0.4 MG SL SUBL
0.4000 mg | SUBLINGUAL_TABLET | Freq: Once | SUBLINGUAL | Status: AC
  Administered 2024-02-08: 0.4 mg via SUBLINGUAL

## 2024-02-08 MED ORDER — ASPIRIN 325 MG PO TABS: 325.0000 mg | ORAL_TABLET | Freq: Once | ORAL | Status: DC

## 2024-02-08 MED ORDER — ASPIRIN 81 MG PO TBEC
81.0000 mg | DELAYED_RELEASE_TABLET | Freq: Every day | ORAL | Status: DC
  Administered 2024-02-09 – 2024-02-10 (×2): 81 mg via ORAL
  Filled 2024-02-08 (×2): qty 1

## 2024-02-08 MED ORDER — ASPIRIN 81 MG PO CHEW: 162.0000 mg | CHEWABLE_TABLET | Freq: Once | ORAL | Status: DC

## 2024-02-08 MED ORDER — CLOPIDOGREL BISULFATE 75 MG PO TABS
75.0000 mg | ORAL_TABLET | Freq: Every day | ORAL | Status: DC
  Administered 2024-02-09 – 2024-02-10 (×2): 75 mg via ORAL
  Filled 2024-02-08 (×2): qty 1

## 2024-02-08 MED ORDER — ACETAMINOPHEN 325 MG PO TABS: 650.0000 mg | ORAL_TABLET | Freq: Four times a day (QID) | ORAL | Status: DC | PRN

## 2024-02-08 MED ORDER — NITROGLYCERIN 0.4 MG SL SUBL: 0.4000 mg | SUBLINGUAL_TABLET | SUBLINGUAL | Status: DC | PRN

## 2024-02-08 MED ORDER — POTASSIUM CHLORIDE CRYS ER 20 MEQ PO TBCR
40.0000 meq | EXTENDED_RELEASE_TABLET | Freq: Once | ORAL | Status: AC
  Administered 2024-02-09: 40 meq via ORAL
  Filled 2024-02-08: qty 2

## 2024-02-08 MED ORDER — ONDANSETRON HCL 4 MG/2ML IJ SOLN: 4.0000 mg | Freq: Four times a day (QID) | INTRAMUSCULAR | Status: DC | PRN

## 2024-02-08 MED ORDER — MELATONIN 3 MG PO TABS: 3.0000 mg | ORAL_TABLET | Freq: Every evening | ORAL | Status: DC | PRN

## 2024-02-08 MED ORDER — ACETAMINOPHEN 650 MG RE SUPP: 650.0000 mg | Freq: Four times a day (QID) | RECTAL | Status: DC | PRN

## 2024-02-08 NOTE — Patient Instructions (Addendum)
 It was wonderful to see you today.  Your arm pain. Im a bit worried with your consolation of symptoms, especially with your recent heart pain. I think the best idea is to send you to the emergency department for more evaluation.   Noor Yeni Jiggetts, MD  [facility]

## 2024-02-08 NOTE — ED Provider Triage Note (Signed)
 Emergency Medicine Provider Triage Evaluation Note  [name] , a 73 y.o. female  was evaluated in triage.  Pt complains of cp. Persistent L sided chest pressure, with sob, lighthead, and nausea x 5 days.  No fever, productive cough.  Significant cardiac hx and pain felt similar.  Got SL nitroglycerin  by pcp today and report improvement of sxs  Review of Systems  Positive: As above Negative: As above  Physical Exam  BP 137/74 (BP Location: Left Arm)   Pulse 70   Temp 98 F (36.7 C)   Resp 18   Ht 5' 6 (1.676 m)   Wt 73.9 kg   SpO2 94%   BMI 26.31 kg/m  Gen:   Awake, no distress   Resp:  Normal effort  MSK:   Moves extremities without difficulty  Other:    Medical Decision Making  Medically screening exam initiated at 6:14 PM.  Appropriate orders placed.  [name] was informed that the remainder of the evaluation will be completed by another provider, this initial triage assessment does not replace that evaluation, and the importance of remaining in the ED until their evaluation is complete.     Nivia Colon, PA-C 02/08/24 1816

## 2024-02-08 NOTE — Progress Notes (Signed)
    SUBJECTIVE:   CHIEF COMPLAINT / HPI:   Patient is a 73 year old female with past medical history of hypertension, hyperlipidemia, migraines, Hx pulmonary embolism, CHF, CAD status post stent placement 08/2023 who presents for left arm pain.  The pain started about a week ago.  Reports that starts on her left chest and radiates down to her arm.  Describes it as sharp pain, denies any numbness or tingling.  Patient also reports shortness of breath started about the same time.  She has also noticed she has some increased leg swelling over the past week.  She does take as needed Lasix  for this, has been taking it every day for the past week. Has tried ice and Tylenol  for this pain and does not seem to help.   OBJECTIVE:   BP 126/62   Pulse 72   Wt 170 lb (77.1 kg)   SpO2 98%   BMI 27.44 kg/m   General: A&O, in mild distress  HEENT: No sign of trauma, EOM grossly intact Cardiac: RRR, ystolic ejection murmur, new from previous Respiratory: fine crackles at right lung base, some increased WOB with exertion  GI: Soft, NTTP, non-distended  Extremities: 1+ pedal edema bilaterally .   ASSESSMENT/PLAN:   Assessment & Plan Left arm pain Given history of CAD with recent stent placement and CHF, am concerned for ACS. EKG in office today does not show ST changes. Advised patient that as she is currently experiencing chest pain, want her to go to ED via EMS. Patient declines EMS, even after extensive discussion and prefers to wait for private vehicle. Treated chest pain with nitroglycerin  and loaded with 325 mg ASA.      Gloriann Ogren, MD [facility]

## 2024-02-08 NOTE — ED Triage Notes (Signed)
 She endorses shortness of breath and chest pain for the past 5 days. Chest pain goes into her left arm. Endorses that this has happened in the past around 4 months ago. She has had a stent placed. 4 Months ago she had a Widow [employer] heart attach with stent placement.[name]

## 2024-02-08 NOTE — ED Provider Notes (Signed)
 [place] EMERGENCY DEPARTMENT AT [facility] Provider Note   CSN: [id number] Arrival date & time: 02/08/24  1726     Patient presents with: No chief complaint on file.   [name] is a 73 y.o. female past medical history significant for TIA, CHF, CKD, PE, hypertension, and CAD presents today with 5 days of shortness of breath and chest pain.  Patient reports the chest pain radiates into her left arm.  Patient reports increased swelling of bilateral lower extremities.  Patient reports that this last happened approximately 4 months ago.  Patient denies fever, chills, nausea, vomiting, or abdominal pain.  Patient had coronary stent placement in June of this year.   HPI     Prior to Admission medications   Medication Sig Start Date End Date Taking? Authorizing Provider  albuterol  (VENTOLIN  HFA) 108 (90 Base) MCG/ACT inhaler Inhale 2 puffs into the lungs every 6 (six) hours as needed for wheezing or shortness of breath. 01/24/20   Hunsucker, Donnice JONELLE, MD  aspirin  EC 81 MG tablet Take 1 tablet (81 mg total) by mouth daily. Swallow whole. 08/26/23   Quillen, Michael, MD  BIOTIN PO Take 1 capsule by mouth daily. Dosage unknown    [provider]  CALCIUM  PO Take 1 tablet by mouth daily. Dosage unknown    [provider]  cetirizine  (ZYRTEC ) 10 MG tablet Take 1 tablet (10 mg total) by mouth daily with breakfast. 01/15/22 08/23/24  Rosendo Norleen BROCKS, MD  CHOLECALCIFEROL PO Take 1 tablet by mouth daily. Dosage unknown    [provider]  clopidogrel  (PLAVIX ) 75 MG tablet TAKE 1 TABLET BY MOUTH EVERY DAY 06/02/23   Raford Riggs, MD  fluticasone  (FLONASE ) 50 MCG/ACT nasal spray INSTILL 1 SPRAY INTO BOTH NOSTRILS DAILY 08/07/23   Joshua Domino, DO  furosemide  (LASIX ) 40 MG tablet Take one tablet three times per week on Mondays, Wednesdays, Fridays. May take an additional tablet as needed for weight gain greater than 3lbs in one day or 5lbs in a week. 11/23/23    Vannie Reche RAMAN, NP  gabapentin  (NEURONTIN ) 600 MG tablet TAKE 1 TABLET BY MOUTH THREE TIMES A DAY 11/16/23   Kirsteins, Prentice BRAVO, MD  Galcanezumab -gnlm (EMGALITY ) 120 MG/ML SOAJ Inject 120 mg into the skin every 30 (thirty) days. 12/10/23   Lomax, Amy, NP  imipramine  (TOFRANIL ) 25 MG tablet Take 1 tablet (25 mg total) by mouth at bedtime. 01/06/24   Sater, Charlie LABOR, MD  inclisiran (LEQVIO ) 284 MG/1.5ML SOSY injection Inject as directed 10/04/21   Raford Riggs, MD  losartan  (COZAAR ) 25 MG tablet TAKE 1 TABLET (25 MG TOTAL) BY MOUTH DAILY. 11/16/23 02/14/24  Diona Perkins, MD  metoprolol  succinate (TOPROL -XL) 25 MG 24 hr tablet Take 1 tablet (25 mg total) by mouth daily. 11/02/23   Cleotilde Lukes, DO  ondansetron  (ZOFRAN ) 4 MG tablet Take 1 tablet (4 mg total) by mouth every 8 (eight) hours as needed for nausea or vomiting. 05/24/20   Burnetta Aures, MD  pantoprazole  (PROTONIX ) 40 MG tablet TAKE 1 TABLET BY MOUTH TWICE A DAY 08/07/23   Joshua Domino, DO  Rimegepant Sulfate (NURTEC) 75 MG TBDP Take 1 tablet (75 mg total) by mouth daily as needed (take for abortive therapy of migraine, no more than 1 tablet in 24 hours or 10 per month). 06/03/23   Lomax, Amy, NP  rosuvastatin  (CRESTOR ) 5 MG tablet TAKE 1 TABLET (5 MG TOTAL) BY MOUTH DAILY. 06/02/23 05/27/24  Raford Riggs, MD  sertraline  (ZOLOFT ) 100 MG tablet TAKE 1 TABLET BY MOUTH EVERY DAY 05/01/23   Joshua Domino, DO  traMADol  (ULTRAM ) 50 MG tablet TAKE 1 TABLET EVERY 8 HOURS AS NEEDED FOR MODERATE PAIN 08/14/23   Kirsteins, Prentice BRAVO, MD  zonisamide  (ZONEGRAN ) 100 MG capsule Take 1 capsule (100 mg total) by mouth 2 (two) times daily. 06/03/23   Lomax, Amy, NP    Allergies: Iodine, Shellfish allergy, Crestor  [rosuvastatin ], and Lipitor [atorvastatin ]    Review of Systems  Respiratory:  Positive for shortness of breath.   Cardiovascular:  Positive for chest pain.    Updated Vital Signs BP (!) 118/55   Pulse 64   Temp 98 F (36.7 C)   Resp 18    Ht 5' 6 (1.676 m)   Wt 73.9 kg   SpO2 96%   BMI 26.31 kg/m   Physical Exam Vitals and nursing note reviewed.  Constitutional:      General: She is not in acute distress.    Appearance: Normal appearance. She is well-developed. She is not toxic-appearing.  HENT:     Head: Normocephalic and atraumatic.  Eyes:     Extraocular Movements: Extraocular movements intact.     Conjunctiva/sclera: Conjunctivae normal.  Cardiovascular:     Rate and Rhythm: Normal rate and regular rhythm.     Pulses: Normal pulses.     Heart sounds: Normal heart sounds. No murmur heard. Pulmonary:     Effort: Pulmonary effort is normal. No respiratory distress.     Breath sounds: Normal breath sounds. No wheezing.  Abdominal:     Palpations: Abdomen is soft.     Tenderness: There is no abdominal tenderness.  Musculoskeletal:        General: No swelling.     Cervical back: Neck supple.     Right lower leg: Edema present.     Left lower leg: Edema present.     Comments: Bilateral +1 pitting edema to the lower extremities.  Skin:    General: Skin is warm and dry.     Capillary Refill: Capillary refill takes less than 2 seconds.  Neurological:     General: No focal deficit present.     Mental Status: She is alert and oriented to person, place, and time.  Psychiatric:        Mood and Affect: Mood normal.     (all labs ordered are listed, but only abnormal results are displayed) Labs Reviewed  BASIC METABOLIC PANEL WITH GFR - Abnormal; Notable for the following components:      Result Value   Creatinine, Ser 1.36 (*)    Calcium  8.5 (*)    GFR, Estimated 41 (*)    All other components within normal limits  CBC  BRAIN NATRIURETIC PEPTIDE  TROPONIN I (HIGH SENSITIVITY)  TROPONIN I (HIGH SENSITIVITY)    EKG: None  Radiology: DG Chest 2 View Result Date: 02/08/2024 CLINICAL DATA:  Chest pain EXAM: CHEST - 2 VIEW COMPARISON:  Chest x-ray 10/07/2023 FINDINGS: The heart size and mediastinal  contours are within normal limits. Both lungs are clear. The visualized skeletal structures are unremarkable. IMPRESSION: No active cardiopulmonary disease. Electronically Signed   By: Greig Pique M.D.   On: 02/08/2024 19:23     Procedures   Medications Ordered in the ED - No data to display  Medical Decision Making Amount and/or Complexity of Data Reviewed Labs: ordered.   This patient presents to the ED for concern of chest pain/ shortness of breath, this involves an extensive number of treatment options, and is a complaint that carries with it a high risk of complications and morbidity.  The differential diagnosis includes STEMI, NSTEMI, arrhythmia, anemia, electrolyte abnormality, GERD, costochondritis, CHF exacerbation   Co morbidities / Chronic conditions that complicate the patient evaluation  CAD, hypertension, CHF   Additional history obtained:  Additional history obtained from EMR External records from outside source obtained and reviewed including previous admissions   Lab Tests:  I Ordered, and personally interpreted labs.  The pertinent results include: CBC unremarkable, mildly elevated creatinine at 1.36 from a baseline of approximately 1.15, delta troponin 7, BNP 65.2   Imaging Studies ordered:  I ordered imaging studies including chest x-ray I independently visualized and interpreted imaging which showed no active cardiopulmonary disease I agree with the radiologist interpretation   Cardiac Monitoring: / EKG:  The patient was maintained on a cardiac monitor.  I personally viewed and interpreted the cardiac monitored which showed an underlying rhythm of: NSR   Consultations Obtained:  I requested consultation with the cardiology, Dr. Donnel and discussed lab and imaging findings as well as pertinent plan - they recommend: Admission for observation [facility] Hospitalist, Dr. Marcene who is agreeable to  admission  Test / Admission - Considered:  Admit      Final diagnoses:  Unstable angina [facility])    ED Discharge Orders     None          Francis Ileana SAILOR, PA-C 02/08/24 [id number]    Freddi Hamilton, MD 02/11/24 1415

## 2024-02-08 NOTE — H&P (Signed)
 History and Physical      [name] FMW:[id number] DOB: 1950-11-21 DOA: 02/08/2024; DOS: 02/08/2024  PCP: Lonnie Earnest, MD *** Patient coming from: home ***  I have personally briefly reviewed patient's old medical records in [facility] Link  Chief Complaint: ***  HPI: [name] is a 73 y.o. female with medical history significant for *** who is admitted to [facility] on 02/08/2024 with *** after presenting from home*** to [facility] ED complaining of ***.    ***       ***   ED Course:  Vital signs in the ED were notable for the following: ***  Labs were notable for the following: ***  Per my interpretation, EKG in ED demonstrated the following:  ***  Imaging in the ED, per corresponding formal radiology read, was notable for the following:  ***  EDP d/w on-call cardiology fellow, who recommended Hospitalist admission, conveyed that cardiology will formally consult, and did not recommend initiation of heparin  drip at this time.    While in the ED, the following were administered: ***  Subsequently, the patient was admitted  ***  ***red    Review of Systems: As per HPI otherwise 10 point review of systems negative.   Past Medical History:  Diagnosis Date   Anemia    Cervical neuritis    Chronic kidney disease    stage 3   Dyslipidemia    GERD (gastroesophageal reflux disease)    History of transient ischemic attack (TIA)    HTN (hypertension)    Hyperlipidemia    Migraine    Migraine headache    Occipital neuralgia    Left   Pulmonary embolism (HCC) 2017   Renal insufficiency    Stroke (HCC) 10/2013    Past Surgical History:  Procedure Laterality Date   ABDOMINAL HYSTERECTOMY     ANTERIOR CERVICAL DECOMP/DISCECTOMY FUSION N/A 05/24/2020   Procedure: ANTERIOR CERVICAL DECOMPRESSION/DISCECTOMY FUSION 1 LEVEL C3-4;  Surgeon: Burnetta Aures, MD;  Location: [facility] OR;  Service: Orthopedics;  Laterality: N/A;  3 hrs   BILIARY STENT  PLACEMENT N/A 12/12/2015   Procedure: BILIARY STENT PLACEMENT;  Surgeon: Victory LITTIE Legrand DOUGLAS, MD;  Location: [facility] ENDOSCOPY;  Service: Endoscopy;  Laterality: N/A;   BLADDER REPAIR     CARPAL TUNNEL RELEASE     CERVICAL FUSION     CERVICAL SPINE SURGERY     CHOLECYSTECTOMY N/A 12/06/2015   Procedure: LAPAROSCOPIC CHOLECYSTECTOMY WITH  INTRAOPERATIVE CHOLANGIOGRAM;  Surgeon: Jina Nephew, MD;  Location: [facility] OR;  Service: General;  Laterality: N/A;   CORONARY PRESSURE/FFR STUDY N/A 08/25/2023   Procedure: CORONARY PRESSURE/FFR STUDY;  Surgeon: Jordan, Peter M, MD;  Location: [facility] INVASIVE CV LAB;  Service: Cardiovascular;  Laterality: N/A;   CORONARY STENT INTERVENTION N/A 08/25/2023   Procedure: CORONARY STENT INTERVENTION;  Surgeon: Jordan, Peter M, MD;  Location: [facility] INVASIVE CV LAB;  Service: Cardiovascular;  Laterality: N/A;   ELBOW SURGERY     ERCP N/A 12/12/2015   Procedure: ENDOSCOPIC RETROGRADE CHOLANGIOPANCREATOGRAPHY (ERCP);  Surgeon: Victory LITTIE Legrand DOUGLAS, MD;  Location: [facility] ENDOSCOPY;  Service: Endoscopy;  Laterality: N/A;   ESOPHAGOGASTRODUODENOSCOPY N/A 01/28/2016   Procedure: ESOPHAGOGASTRODUODENOSCOPY (EGD) Biliary STENT removal;  Surgeon: Victory LITTIE Legrand DOUGLAS, MD;  Location: [facility] ENDOSCOPY;  Service: Gastroenterology;  Laterality: N/A;   IR GENERIC HISTORICAL  12/17/2015   IR US  GUIDE BX ASP/DRAIN 12/17/2015 [facility]-INTERV RAD   IR GENERIC HISTORICAL  12/17/2015   IR GUIDED DRAIN W CATHETER PLACEMENT 12/17/2015  [facility]-INTER[facility] RAD   IR GENERIC HISTORICAL  12/17/2015   IR SINUS/FIST TUBE CHK-NON GI 12/17/2015 [facility]-INTER[facility] RAD   KNEE SURGERY     LEFT HEART CATH AND CORONARY ANGIOGRAPHY N/A 08/25/2023   Procedure: LEFT HEART CATH AND CORONARY ANGIOGRAPHY;  Surgeon: Jordan, Peter M, MD;  Location: [facility] INVASIVE CV LAB;  Service: Cardiovascular;  Laterality: N/A;   LUNG SURGERY     TEE WITHOUT CARDIOVERSION N/A 12/19/2015   Procedure: TRANSESOPHAGEAL ECHOCARDIOGRAM (TEE);  Surgeon: Maude JAYSON Emmer, MD;  Location: [facility]  ENDOSCOPY;  Service: Cardiovascular;  Laterality: N/A;    Social History:  reports that she quit smoking about 12 years ago. Her smoking use included cigarettes. She started smoking about 54 years ago. She has a 64 pack-year smoking history. She has been exposed to tobacco smoke. She has never used smokeless tobacco. She reports that she does not drink alcohol and does not use drugs.   Allergies  Allergen Reactions   Iodine Hives and Itching    With MRI scan   Shellfish Allergy Anaphylaxis    All seafood   Crestor  [Rosuvastatin ] Itching and Other (See Comments)    Anxiety, headaches, myalgias Able to tolerate 5mg  daily   Lipitor [Atorvastatin ] Other (See Comments)    Bad headaches    Family History  Problem Relation Age of Onset   Cancer Mother 54       breast   Alzheimer's disease Mother    Prostate cancer Father    Lung cancer Father    Cancer Brother    Hyperlipidemia Brother    Sleep apnea Brother    Cancer Brother    Hyperlipidemia Brother    Dementia Brother        frontotemporal dementia   Dementia Maternal Grandmother    Cancer Maternal Grandfather     Family history reviewed and not pertinent ***   Prior to Admission medications   Medication Sig Start Date End Date Taking? Authorizing Provider  albuterol  (VENTOLIN  HFA) 108 (90 Base) MCG/ACT inhaler Inhale 2 puffs into the lungs every 6 (six) hours as needed for wheezing or shortness of breath. 01/24/20   Hunsucker, Donnice SAUNDERS, MD  aspirin  EC 81 MG tablet Take 1 tablet (81 mg total) by mouth daily. Swallow whole. 08/26/23   Quillen, Michael, MD  BIOTIN PO Take 1 capsule by mouth daily. Dosage unknown    [provider]  CALCIUM  PO Take 1 tablet by mouth daily. Dosage unknown    [provider]  cetirizine  (ZYRTEC ) 10 MG tablet Take 1 tablet (10 mg total) by mouth daily with breakfast. 01/15/22 08/23/24  Rosendo Norleen JAYSON, MD  CHOLECALCIFEROL PO Take 1 tablet by mouth daily. Dosage unknown     [provider]  clopidogrel  (PLAVIX ) 75 MG tablet TAKE 1 TABLET BY MOUTH EVERY DAY 06/02/23   Raford Riggs, MD  fluticasone  (FLONASE ) 50 MCG/ACT nasal spray INSTILL 1 SPRAY INTO BOTH NOSTRILS DAILY 08/07/23   Joshua Domino, DO  furosemide  (LASIX ) 40 MG tablet Take one tablet three times per week on Mondays, Wednesdays, Fridays. May take an additional tablet as needed for weight gain greater than 3lbs in one day or 5lbs in a week. 11/23/23   Walker, Caitlin S, NP  gabapentin  (NEURONTIN ) 600 MG tablet TAKE 1 TABLET BY MOUTH THREE TIMES A DAY 11/16/23   Kirsteins, Prentice BRAVO, MD  Galcanezumab -gnlm (EMGALITY ) 120 MG/ML SOAJ Inject 120 mg into the skin every 30 (thirty) days. 12/10/23   Lomax, Amy, NP  imipramine  (  TOFRANIL ) 25 MG tablet Take 1 tablet (25 mg total) by mouth at bedtime. 01/06/24   Sater, Charlie LABOR, MD  inclisiran (LEQVIO ) 284 MG/1.5ML SOSY injection Inject as directed 10/04/21   Raford Riggs, MD  losartan  (COZAAR ) 25 MG tablet TAKE 1 TABLET (25 MG TOTAL) BY MOUTH DAILY. 11/16/23 02/14/24  Diona Perkins, MD  metoprolol  succinate (TOPROL -XL) 25 MG 24 hr tablet Take 1 tablet (25 mg total) by mouth daily. 11/02/23   Cleotilde Lukes, DO  ondansetron  (ZOFRAN ) 4 MG tablet Take 1 tablet (4 mg total) by mouth every 8 (eight) hours as needed for nausea or vomiting. 05/24/20   Burnetta Aures, MD  pantoprazole  (PROTONIX ) 40 MG tablet TAKE 1 TABLET BY MOUTH TWICE A DAY 08/07/23   Joshua Domino, DO  Rimegepant Sulfate (NURTEC) 75 MG TBDP Take 1 tablet (75 mg total) by mouth daily as needed (take for abortive therapy of migraine, no more than 1 tablet in 24 hours or 10 per month). 06/03/23   Lomax, Amy, NP  rosuvastatin  (CRESTOR ) 5 MG tablet TAKE 1 TABLET (5 MG TOTAL) BY MOUTH DAILY. 06/02/23 05/27/24  Raford Riggs, MD  sertraline  (ZOLOFT ) 100 MG tablet TAKE 1 TABLET BY MOUTH EVERY DAY 05/01/23   Joshua Domino, DO  traMADol  (ULTRAM ) 50 MG tablet TAKE 1 TABLET EVERY 8 HOURS AS NEEDED FOR MODERATE PAIN  08/14/23   Kirsteins, Prentice BRAVO, MD  zonisamide  (ZONEGRAN ) 100 MG capsule Take 1 capsule (100 mg total) by mouth 2 (two) times daily. 06/03/23   Cary No, NP     Objective    Physical Exam: Vitals:   02/08/24 1741 02/08/24 1915 02/08/24 2115 02/08/24 2145  BP:  129/63 119/68 (!) 118/55  Pulse:  67 65 64  Resp:  14 18 18   Temp:   98 F (36.7 C)   SpO2:  97% 94% 96%  Weight: 73.9 kg     Height: 5' 6 (1.676 m)       General: appears to be stated age; alert, oriented Skin: warm, dry, no rash Head:  AT/[place] Mouth:  Oral mucosa membranes appear moist, normal dentition Neck: supple; trachea midline Heart:  RRR; did not appreciate any M/R/G Lungs: CTAB, did not appreciate any wheezes, rales, or rhonchi Abdomen: + BS; soft, ND, NT Vascular: 2+ pedal pulses b/l; 2+ radial pulses b/l Extremities: no peripheral edema, no muscle wasting   ***   *** Neuro: strength and sensation intact in upper and lower extremities b/l  *** Neuro: 5/5 strength of the proximal and distal flexors and extensors of the upper and lower extremities bilaterally; sensation intact in upper and lower extremities b/l; cranial nerves II through XII grossly intact; no pronator drift; no evidence suggestive of slurred speech, dysarthria, or facial droop; Normal muscle tone. No tremors. *** Neuro: In the setting of the patient's current mental status and associated inability to follow instructions, unable to perform full neurologic exam at this time.  As such, assessment of strength, sensation, and cranial nerves is limited at this time. Patient noted to spontaneously move all 4 extremities. No tremors.  ***        Labs on Admission: I have personally reviewed following labs and imaging studies  CBC: Recent Labs  Lab 02/08/24 1815  WBC 6.6  HGB 12.2  HCT 37.8  MCV 87.7  PLT 209   Basic Metabolic Panel: Recent Labs  Lab 02/08/24 1815  NA 139  K 3.5  CL 102  CO2 25  GLUCOSE 91  BUN 22  CREATININE  1.36*  CALCIUM  8.5*   GFR: Estimated Creatinine Clearance: 38.4 mL/min (A) (by C-G formula based on SCr of 1.36 mg/dL (H)). Liver Function Tests: No results for input(s): AST, ALT, ALKPHOS, BILITOT, PROT, ALBUMIN  in the last 168 hours. No results for input(s): LIPASE, AMYLASE in the last 168 hours. No results for input(s): AMMONIA in the last 168 hours. Coagulation Profile: No results for input(s): INR, PROTIME in the last 168 hours. Cardiac Enzymes: No results for input(s): CKTOTAL, CKMB, CKMBINDEX, TROPONINI in the last 168 hours. BNP (last 3 results) No results for input(s): PROBNP in the last 8760 hours. HbA1C: No results for input(s): HGBA1C in the last 72 hours. CBG: No results for input(s): GLUCAP in the last 168 hours. Lipid Profile: No results for input(s): CHOL, HDL, LDLCALC, TRIG, CHOLHDL, LDLDIRECT in the last 72 hours. Thyroid  Function Tests: No results for input(s): TSH, T4TOTAL, FREET4, T3FREE, THYROIDAB in the last 72 hours. Anemia Panel: No results for input(s): VITAMINB12, FOLATE, FERRITIN, TIBC, IRON , RETICCTPCT in the last 72 hours. Urine analysis:    Component Value Date/Time   COLORURINE YELLOW 08/29/2019 1018   APPEARANCEUR Clear 07/13/2023 1030   LABSPEC 1.011 08/29/2019 1018   PHURINE 8.0 08/29/2019 1018   GLUCOSEU Negative 07/13/2023 1030   HGBUR NEGATIVE 08/29/2019 1018   BILIRUBINUR Negative 07/13/2023 1030   BILIRUBINUR negative 07/13/2023 1023   KETONESUR negative 07/13/2023 1023   KETONESUR 20 (A) 08/29/2019 1018   PROTEINUR Negative 07/13/2023 1030   PROTEINUR negative 07/13/2023 1023   PROTEINUR 30 (A) 08/29/2019 1018   UROBILINOGEN 0.2 07/13/2023 1023   UROBILINOGEN 0.2 04/03/2018 1131   NITRITE Negative 07/13/2023 1030   NITRITE Negative 07/13/2023 1023   NITRITE NEGATIVE 08/29/2019 1018   LEUKOCYTESUR Negative 07/13/2023 1030   LEUKOCYTESUR Negative 07/13/2023 1023    LEUKOCYTESUR NEGATIVE 08/29/2019 1018    Radiological Exams on Admission: DG Chest 2 View Result Date: 02/08/2024 CLINICAL DATA:  Chest pain EXAM: CHEST - 2 VIEW COMPARISON:  Chest x-ray 10/07/2023 FINDINGS: The heart size and mediastinal contours are within normal limits. Both lungs are clear. The visualized skeletal structures are unremarkable. IMPRESSION: No active cardiopulmonary disease. Electronically Signed   By: Greig Pique M.D.   On: 02/08/2024 19:23      Assessment/Plan   Active Problems:   Chest pain   ***            ***           EDP d/w on-call cardiology fellow, who recommended Hospitalist admission, conveyed that cardiology will formally consult, and did not recommend initiation of heparin  drip at this time.   ***          ***                      ***                     ***                     ***                     ***                      ***                     ***                     ***                     ***                     ***                    ***                   ***  DVT prophylaxis: SCD's ***  Code Status: Full code*** Family Communication: none*** Disposition Plan: Per Rounding Team Consults called: EDP d/w on-call cardiology fellow, who recommended Hospitalist admission, conveyed that cardiology will formally consult, and did not recommend initiation of heparin  drip at this time.  ***;  Admission status: ***     I SPENT GREATER THAN 75 *** MINUTES IN CLINICAL CARE TIME/MEDICAL DECISION-MAKING IN COMPLETING THIS ADMISSION.      Eva NOVAK Johnaton Sonneborn DO [facility]  From 7PM - 7AM   02/08/2024, 11:43 PM   ***

## 2024-02-08 NOTE — Progress Notes (Signed)
 Treated patient with sublingual nitroglycerin  0.4 mg at 4:25 PM.

## 2024-02-09 ENCOUNTER — Encounter (HOSPITAL_COMMUNITY): Admission: EM | Disposition: A | Payer: Self-pay | Source: Home / Self Care | Attending: Emergency Medicine

## 2024-02-09 ENCOUNTER — Observation Stay (HOSPITAL_COMMUNITY)

## 2024-02-09 ENCOUNTER — Encounter (HOSPITAL_COMMUNITY): Payer: Self-pay | Admitting: Internal Medicine

## 2024-02-09 DIAGNOSIS — I25110 Atherosclerotic heart disease of native coronary artery with unstable angina pectoris: Secondary | ICD-10-CM

## 2024-02-09 DIAGNOSIS — I200 Unstable angina: Principal | ICD-10-CM

## 2024-02-09 DIAGNOSIS — J309 Allergic rhinitis, unspecified: Secondary | ICD-10-CM | POA: Diagnosis present

## 2024-02-09 DIAGNOSIS — R0789 Other chest pain: Secondary | ICD-10-CM

## 2024-02-09 DIAGNOSIS — Z7982 Long term (current) use of aspirin: Secondary | ICD-10-CM | POA: Diagnosis not present

## 2024-02-09 DIAGNOSIS — E785 Hyperlipidemia, unspecified: Secondary | ICD-10-CM | POA: Diagnosis not present

## 2024-02-09 DIAGNOSIS — I10 Essential (primary) hypertension: Secondary | ICD-10-CM | POA: Diagnosis not present

## 2024-02-09 DIAGNOSIS — R0602 Shortness of breath: Secondary | ICD-10-CM | POA: Diagnosis not present

## 2024-02-09 DIAGNOSIS — N1832 Chronic kidney disease, stage 3b: Secondary | ICD-10-CM | POA: Diagnosis not present

## 2024-02-09 DIAGNOSIS — Z8679 Personal history of other diseases of the circulatory system: Secondary | ICD-10-CM | POA: Diagnosis not present

## 2024-02-09 DIAGNOSIS — I5032 Chronic diastolic (congestive) heart failure: Secondary | ICD-10-CM | POA: Diagnosis not present

## 2024-02-09 DIAGNOSIS — R079 Chest pain, unspecified: Secondary | ICD-10-CM | POA: Diagnosis not present

## 2024-02-09 DIAGNOSIS — E7800 Pure hypercholesterolemia, unspecified: Secondary | ICD-10-CM | POA: Diagnosis not present

## 2024-02-09 DIAGNOSIS — I2583 Coronary atherosclerosis due to lipid rich plaque: Secondary | ICD-10-CM | POA: Diagnosis not present

## 2024-02-09 DIAGNOSIS — K219 Gastro-esophageal reflux disease without esophagitis: Secondary | ICD-10-CM | POA: Diagnosis not present

## 2024-02-09 DIAGNOSIS — Z87891 Personal history of nicotine dependence: Secondary | ICD-10-CM | POA: Diagnosis not present

## 2024-02-09 DIAGNOSIS — N183 Chronic kidney disease, stage 3 unspecified: Secondary | ICD-10-CM | POA: Diagnosis not present

## 2024-02-09 DIAGNOSIS — I130 Hypertensive heart and chronic kidney disease with heart failure and stage 1 through stage 4 chronic kidney disease, or unspecified chronic kidney disease: Secondary | ICD-10-CM | POA: Diagnosis not present

## 2024-02-09 DIAGNOSIS — E782 Mixed hyperlipidemia: Secondary | ICD-10-CM | POA: Diagnosis not present

## 2024-02-09 HISTORY — PX: LEFT HEART CATH AND CORONARY ANGIOGRAPHY: CATH118249

## 2024-02-09 LAB — COMPREHENSIVE METABOLIC PANEL WITH GFR
ALT: 12 U/L (ref 0–44)
AST: 19 U/L (ref 15–41)
Albumin: 3.2 g/dL — ABNORMAL LOW (ref 3.5–5.0)
Alkaline Phosphatase: 64 U/L (ref 38–126)
Anion gap: 7 (ref 5–15)
BUN: 23 mg/dL (ref 8–23)
CO2: 30 mmol/L (ref 22–32)
Calcium: 8.4 mg/dL — ABNORMAL LOW (ref 8.9–10.3)
Chloride: 103 mmol/L (ref 98–111)
Creatinine, Ser: 1.31 mg/dL — ABNORMAL HIGH (ref 0.44–1.00)
GFR, Estimated: 43 mL/min — ABNORMAL LOW (ref >60)
Glucose, Bld: 88 mg/dL (ref 70–99)
Potassium: 3.7 mmol/L (ref 3.5–5.1)
Sodium: 140 mmol/L (ref 135–145)
Total Bilirubin: 0.6 mg/dL (ref 0.0–1.2)
Total Protein: 5.5 g/dL — ABNORMAL LOW (ref 6.5–8.1)

## 2024-02-09 LAB — ECHOCARDIOGRAM COMPLETE
AR max vel: 1.51 cm2
AV Area VTI: 1.59 cm2
AV Area mean vel: 1.47 cm2
AV Mean grad: 8 mmHg
AV Peak grad: 16 mmHg
Ao pk vel: 2 m/s
Area-P 1/2: 3.61 cm2
Height: 66 in
S' Lateral: 2.9 cm
Weight: 2673.74 [oz_av]

## 2024-02-09 LAB — CBC WITH DIFFERENTIAL/PLATELET
Abs Immature Granulocytes: 0.02 K/uL (ref 0.00–0.07)
Basophils Absolute: 0.1 K/uL (ref 0.0–0.1)
Basophils Relative: 1 %
Eosinophils Absolute: 0.2 K/uL (ref 0.0–0.5)
Eosinophils Relative: 3 %
HCT: 34.5 % — ABNORMAL LOW (ref 36.0–46.0)
Hemoglobin: 11.3 g/dL — ABNORMAL LOW (ref 12.0–15.0)
Immature Granulocytes: 0 %
Lymphocytes Relative: 33 %
Lymphs Abs: 1.5 K/uL (ref 0.7–4.0)
MCH: 28.3 pg (ref 26.0–34.0)
MCHC: 32.8 g/dL (ref 30.0–36.0)
MCV: 86.5 fL (ref 80.0–100.0)
Monocytes Absolute: 0.4 K/uL (ref 0.1–1.0)
Monocytes Relative: 8 %
Neutro Abs: 2.5 K/uL (ref 1.7–7.7)
Neutrophils Relative %: 55 %
Platelets: 183 K/uL (ref 150–400)
RBC: 3.99 MIL/uL (ref 3.87–5.11)
RDW: 14.2 % (ref 11.5–15.5)
WBC: 4.6 K/uL (ref 4.0–10.5)
nRBC: 0 % (ref 0.0–0.2)

## 2024-02-09 LAB — LIPASE, BLOOD: Lipase: 25 U/L (ref 11–51)

## 2024-02-09 LAB — LIPID PANEL
Cholesterol: 91 mg/dL (ref 0–200)
HDL: 60 mg/dL (ref >40)
LDL Cholesterol: 13 mg/dL (ref 0–99)
Total CHOL/HDL Ratio: 1.5 ratio
Triglycerides: 88 mg/dL (ref <150)
VLDL: 18 mg/dL (ref 0–40)

## 2024-02-09 LAB — MAGNESIUM: Magnesium: 2.1 mg/dL (ref 1.7–2.4)

## 2024-02-09 LAB — TROPONIN I (HIGH SENSITIVITY): Troponin I (High Sensitivity): 7 ng/L (ref <18)

## 2024-02-09 LAB — D-DIMER, QUANTITATIVE: D-Dimer, Quant: 0.8 ug{FEU}/mL — ABNORMAL HIGH (ref 0.00–0.50)

## 2024-02-09 SURGERY — LEFT HEART CATH AND CORONARY ANGIOGRAPHY
Anesthesia: LOCAL

## 2024-02-09 MED ORDER — VERAPAMIL HCL 2.5 MG/ML IV SOLN
INTRAVENOUS | Status: DC | PRN
  Administered 2024-02-09: 10 mL via INTRA_ARTERIAL

## 2024-02-09 MED ORDER — SODIUM CHLORIDE 0.9 % IV SOLN: 250.0000 mL | INTRAVENOUS | Status: AC | PRN

## 2024-02-09 MED ORDER — HYDRALAZINE HCL 20 MG/ML IJ SOLN: 10.0000 mg | INTRAMUSCULAR | Status: AC | PRN

## 2024-02-09 MED ORDER — HEPARIN (PORCINE) IN NACL 1000-0.9 UT/500ML-% IV SOLN
INTRAVENOUS | Status: DC | PRN
  Administered 2024-02-09: 1000 mL

## 2024-02-09 MED ORDER — PANTOPRAZOLE SODIUM 40 MG PO TBEC
40.0000 mg | DELAYED_RELEASE_TABLET | Freq: Two times a day (BID) | ORAL | Status: DC
  Administered 2024-02-09 – 2024-02-10 (×3): 40 mg via ORAL
  Filled 2024-02-09 (×3): qty 1

## 2024-02-09 MED ORDER — ISOSORBIDE MONONITRATE ER 30 MG PO TB24: 15.0000 mg | ORAL_TABLET | Freq: Every day | ORAL | Status: DC

## 2024-02-09 MED ORDER — FREE WATER
250.0000 mL | Freq: Once | Status: AC
  Administered 2024-02-09: 250 mL via ORAL

## 2024-02-09 MED ORDER — HEPARIN SODIUM (PORCINE) 1000 UNIT/ML IJ SOLN
INTRAMUSCULAR | Status: DC | PRN
  Administered 2024-02-09: 3500 [IU] via INTRAVENOUS

## 2024-02-09 MED ORDER — LOSARTAN POTASSIUM 25 MG PO TABS
25.0000 mg | ORAL_TABLET | Freq: Every day | ORAL | Status: DC
  Administered 2024-02-09 – 2024-02-10 (×2): 25 mg via ORAL
  Filled 2024-02-09 (×2): qty 1

## 2024-02-09 MED ORDER — METOPROLOL SUCCINATE ER 25 MG PO TB24
25.0000 mg | ORAL_TABLET | Freq: Every day | ORAL | Status: DC
  Administered 2024-02-09 – 2024-02-10 (×2): 25 mg via ORAL
  Filled 2024-02-09 (×2): qty 1

## 2024-02-09 MED ORDER — MIDAZOLAM HCL 2 MG/2ML IJ SOLN
INTRAMUSCULAR | Status: AC
  Filled 2024-02-09: qty 2

## 2024-02-09 MED ORDER — LABETALOL HCL 5 MG/ML IV SOLN: 10.0000 mg | INTRAVENOUS | Status: AC | PRN

## 2024-02-09 MED ORDER — SODIUM CHLORIDE 0.9% FLUSH: 3.0000 mL | INTRAVENOUS | Status: DC | PRN

## 2024-02-09 MED ORDER — ISOSORBIDE MONONITRATE ER 30 MG PO TB24
15.0000 mg | ORAL_TABLET | Freq: Every day | ORAL | Status: DC
  Administered 2024-02-09 – 2024-02-10 (×2): 15 mg via ORAL
  Filled 2024-02-09 (×2): qty 1

## 2024-02-09 MED ORDER — LIDOCAINE HCL (PF) 1 % IJ SOLN
INTRAMUSCULAR | Status: AC
  Filled 2024-02-09: qty 30

## 2024-02-09 MED ORDER — IOHEXOL 350 MG/ML SOLN
INTRAVENOUS | Status: DC | PRN
  Administered 2024-02-09: 70 mL

## 2024-02-09 MED ORDER — MIDAZOLAM HCL (PF) 2 MG/2ML IJ SOLN
INTRAMUSCULAR | Status: DC | PRN
  Administered 2024-02-09: 1 mg via INTRAVENOUS

## 2024-02-09 MED ORDER — SODIUM CHLORIDE 0.9 % IV SOLN
INTRAVENOUS | Status: AC | PRN
  Administered 2024-02-09: 10 mL/h via INTRAVENOUS

## 2024-02-09 MED ORDER — SODIUM CHLORIDE 0.9% FLUSH
3.0000 mL | Freq: Two times a day (BID) | INTRAVENOUS | Status: DC
  Administered 2024-02-09 – 2024-02-10 (×3): 3 mL via INTRAVENOUS

## 2024-02-09 MED ORDER — FLUTICASONE PROPIONATE 50 MCG/ACT NA SUSP
1.0000 | Freq: Every day | NASAL | Status: DC
  Filled 2024-02-09: qty 16

## 2024-02-09 MED ORDER — METHYLPREDNISOLONE SODIUM SUCC 125 MG IJ SOLR
125.0000 mg | Freq: Once | INTRAMUSCULAR | Status: AC
  Administered 2024-02-09: 125 mg via INTRAVENOUS
  Filled 2024-02-09: qty 2

## 2024-02-09 MED ORDER — DIPHENHYDRAMINE HCL 25 MG PO CAPS
25.0000 mg | ORAL_CAPSULE | Freq: Once | ORAL | Status: AC
  Administered 2024-02-09: 25 mg via ORAL
  Filled 2024-02-09: qty 1

## 2024-02-09 MED ORDER — ROSUVASTATIN CALCIUM 5 MG PO TABS
5.0000 mg | ORAL_TABLET | Freq: Every day | ORAL | Status: DC
  Administered 2024-02-09 – 2024-02-10 (×2): 5 mg via ORAL
  Filled 2024-02-09 (×2): qty 1

## 2024-02-09 MED ORDER — FREE WATER: 500.0000 mL | Freq: Once | Status: DC

## 2024-02-09 MED ORDER — FENTANYL CITRATE (PF) 100 MCG/2ML IJ SOLN
INTRAMUSCULAR | Status: AC
  Filled 2024-02-09: qty 2

## 2024-02-09 MED ORDER — FENTANYL CITRATE (PF) 100 MCG/2ML IJ SOLN
INTRAMUSCULAR | Status: DC | PRN
  Administered 2024-02-09: 25 ug via INTRAVENOUS

## 2024-02-09 MED ORDER — VERAPAMIL HCL 2.5 MG/ML IV SOLN
INTRAVENOUS | Status: AC
  Filled 2024-02-09: qty 2

## 2024-02-09 MED ORDER — ASPIRIN 81 MG PO CHEW: 81.0000 mg | CHEWABLE_TABLET | ORAL | Status: DC

## 2024-02-09 MED ORDER — LIDOCAINE HCL (PF) 1 % IJ SOLN
INTRAMUSCULAR | Status: DC | PRN
  Administered 2024-02-09: 5 mL via INTRADERMAL

## 2024-02-09 MED ORDER — HEPARIN SODIUM (PORCINE) 1000 UNIT/ML IJ SOLN
INTRAMUSCULAR | Status: AC
  Filled 2024-02-09: qty 10

## 2024-02-09 SURGICAL SUPPLY — 8 items
CATH INFINITI AMBI 5FR TG (CATHETERS) IMPLANT
CATH INFINITI JR4 5F (CATHETERS) IMPLANT
DEVICE RAD COMP TR BAND LRG (VASCULAR PRODUCTS) IMPLANT
GLIDESHEATH SLEND SS 6F .021 (SHEATH) IMPLANT
GUIDEWIRE INQWIRE 1.5J.035X260 (WIRE) IMPLANT
PACK CARDIAC CATHETERIZATION (CUSTOM PROCEDURE TRAY) ×1 IMPLANT
SET ATX-X65L (MISCELLANEOUS) IMPLANT
SHEATH PROBE COVER 6X72 (BAG) IMPLANT

## 2024-02-09 NOTE — Progress Notes (Signed)
 Patient arrived at the unit back from cath lab,TR band present with 13 cc of ar in rt radial,band site level 0,vitals taken

## 2024-02-09 NOTE — Consult Note (Signed)
 Cardiology Consultation   Patient ID: [name] MRN: [id number]; DOB: Jul 04, 1950  Admit date: 02/08/2024 Date of Consult: 02/09/2024  PCP:  Lonnie Earnest, MD    [facility] Providers Cardiologist:  Annabella Scarce, MD        Patient Profile: [name] is a 73 y.o. female with a hx of HTN, CAD s/p DES-LAD 08/25/2023, HLD (intolerant to Atorvastatin , Praluent ), CKD III, CVA, left subclavian artery stenosis, prior tobacco use, prior DVT 2019 treated with Eliquis  for 6 months.  who is being seen 02/09/2024 for the evaluation of  Chest pain at the request of Dr Marcene.  History of Present Illness: [name] is a 73 y.o. female with a hx of HTN, CAD s/p DES-LAD 08/25/2023, HLD (intolerant to Atorvastatin , Praluent ), CKD III, CVA, left subclavian artery stenosis, prior tobacco use, prior DVT 2019 treated with Eliquis  for 6 months.  who is being seen 02/09/2024 for the evaluation of  Chest pain   Patient had recent evaluation in cardiology clinic on 11/2023, was doing well on DAPT therapy, inclisiran for hyperlipidemia. Now comes in with complaints of chest painFor the past 5 days associated with nausea and vomiting and lightheadedness got nitroglycerin  by PCP with improvement in symptoms  ER workup troponin x 2 negative BNP negative. Chest x-ray negative. EKG normal sinus rhythm  Patient has mixed symptoms some of which are exertional some of which are very atypical initially thought to be GERD related   Cardiac studies as below Echo 10/09/2023 EF 60 to 65%, no significant valvular abnormalities. Cardiac cath 08/25/2023 proximal to mid LAD 85% stenosis status post drug-eluting stent in mid LAD RFR was 0.62, mid LCx 15% stenosis, proximal RCA 10% stenosis  Past Medical History:  Diagnosis Date   Anemia    Cervical neuritis    Chronic kidney disease    stage 3   Dyslipidemia    GERD (gastroesophageal reflux disease)    History of transient ischemic attack  (TIA)    HTN (hypertension)    Hyperlipidemia    Migraine    Migraine headache    Occipital neuralgia    Left   Pulmonary embolism (HCC) 2017   Renal insufficiency    Stroke (HCC) 10/2013    Past Surgical History:  Procedure Laterality Date   ABDOMINAL HYSTERECTOMY     ANTERIOR CERVICAL DECOMP/DISCECTOMY FUSION N/A 05/24/2020   Procedure: ANTERIOR CERVICAL DECOMPRESSION/DISCECTOMY FUSION 1 LEVEL C3-4;  Surgeon: Burnetta Aures, MD;  Location: [facility] OR;  Service: Orthopedics;  Laterality: N/A;  3 hrs   BILIARY STENT PLACEMENT N/A 12/12/2015   Procedure: BILIARY STENT PLACEMENT;  Surgeon: Victory LITTIE Legrand DOUGLAS, MD;  Location: [facility] ENDOSCOPY;  Service: Endoscopy;  Laterality: N/A;   BLADDER REPAIR     CARPAL TUNNEL RELEASE     CERVICAL FUSION     CERVICAL SPINE SURGERY     CHOLECYSTECTOMY N/A 12/06/2015   Procedure: LAPAROSCOPIC CHOLECYSTECTOMY WITH  INTRAOPERATIVE CHOLANGIOGRAM;  Surgeon: Jina Nephew, MD;  Location: [facility] OR;  Service: General;  Laterality: N/A;   CORONARY PRESSURE/FFR STUDY N/A 08/25/2023   Procedure: CORONARY PRESSURE/FFR STUDY;  Surgeon: Jordan, Peter M, MD;  Location: [facility] INVASIVE CV LAB;  Service: Cardiovascular;  Laterality: N/A;   CORONARY STENT INTERVENTION N/A 08/25/2023   Procedure: CORONARY STENT INTERVENTION;  Surgeon: Jordan, Peter M, MD;  Location: [facility] INVASIVE CV LAB;  Service: Cardiovascular;  Laterality: N/A;   ELBOW SURGERY     ERCP N/A 12/12/2015   Procedure: ENDOSCOPIC RETROGRADE CHOLANGIOPANCREATOGRAPHY (ERCP);  Surgeon: Victory LITTIE Legrand DOUGLAS, MD;  Location: [facility] ENDOSCOPY;  Service: Endoscopy;  Laterality: N/A;   ESOPHAGOGASTRODUODENOSCOPY N/A 01/28/2016   Procedure: ESOPHAGOGASTRODUODENOSCOPY (EGD) Biliary STENT removal;  Surgeon: Victory LITTIE Legrand DOUGLAS, MD;  Location: [facility] ENDOSCOPY;  Service: Gastroenterology;  Laterality: N/A;   IR GENERIC HISTORICAL  12/17/2015   IR US  GUIDE BX ASP/DRAIN 12/17/2015 [facility]   IR GENERIC HISTORICAL  12/17/2015   IR GUIDED DRAIN W  CATHETER PLACEMENT 12/17/2015 [facility]   IR GENERIC HISTORICAL  12/17/2015   IR SINUS/FIST TUBE CHK-NON GI 12/17/2015 [facility]   KNEE SURGERY     LEFT HEART CATH AND CORONARY ANGIOGRAPHY N/A 08/25/2023   Procedure: LEFT HEART CATH AND CORONARY ANGIOGRAPHY;  Surgeon: Jordan, Peter M, MD;  Location: [facility] INVASIVE CV LAB;  Service: Cardiovascular;  Laterality: N/A;   LUNG SURGERY     TEE WITHOUT CARDIOVERSION N/A 12/19/2015   Procedure: TRANSESOPHAGEAL ECHOCARDIOGRAM (TEE);  Surgeon: Maude JAYSON Emmer, MD;  Location: [facility] ENDOSCOPY;  Service: Cardiovascular;  Laterality: N/A;     Home Medications:  Prior to Admission medications   Medication Sig Start Date End Date Taking? Authorizing Provider  albuterol  (VENTOLIN  HFA) 108 (90 Base) MCG/ACT inhaler Inhale 2 puffs into the lungs every 6 (six) hours as needed for wheezing or shortness of breath. 01/24/20   Hunsucker, Donnice SAUNDERS, MD  aspirin  EC 81 MG tablet Take 1 tablet (81 mg total) by mouth daily. Swallow whole. 08/26/23   Quillen, Michael, MD  BIOTIN PO Take 1 capsule by mouth daily. Dosage unknown    [provider]  CALCIUM  PO Take 1 tablet by mouth daily. Dosage unknown    [provider]  cetirizine  (ZYRTEC ) 10 MG tablet Take 1 tablet (10 mg total) by mouth daily with breakfast. 01/15/22 08/23/24  Norbert, John C, MD  CHOLECALCIFEROL PO Take 1 tablet by mouth daily. Dosage unknown    [provider]  clopidogrel  (PLAVIX ) 75 MG tablet TAKE 1 TABLET BY MOUTH EVERY DAY 06/02/23   Raford Riggs, MD  fluticasone  (FLONASE ) 50 MCG/ACT nasal spray INSTILL 1 SPRAY INTO BOTH NOSTRILS DAILY 08/07/23   Joshua Domino, DO  furosemide  (LASIX ) 40 MG tablet Take one tablet three times per week on Mondays, Wednesdays, Fridays. May take an additional tablet as needed for weight gain greater than 3lbs in one day or 5lbs in a week. 11/23/23   Vannie Reche RAMAN, NP  gabapentin  (NEURONTIN ) 600 MG tablet TAKE 1 TABLET BY MOUTH THREE TIMES A  DAY 11/16/23   Kirsteins, Prentice BRAVO, MD  Galcanezumab -gnlm (EMGALITY ) 120 MG/ML SOAJ Inject 120 mg into the skin every 30 (thirty) days. 12/10/23   Lomax, Amy, NP  imipramine  (TOFRANIL ) 25 MG tablet Take 1 tablet (25 mg total) by mouth at bedtime. 01/06/24   Sater, Charlie LABOR, MD  inclisiran (LEQVIO ) 284 MG/1.5ML SOSY injection Inject as directed 10/04/21   Raford Riggs, MD  losartan  (COZAAR ) 25 MG tablet TAKE 1 TABLET (25 MG TOTAL) BY MOUTH DAILY. 11/16/23 02/14/24  Diona Perkins, MD  metoprolol  succinate (TOPROL -XL) 25 MG 24 hr tablet Take 1 tablet (25 mg total) by mouth daily. 11/02/23   Cleotilde Lukes, DO  ondansetron  (ZOFRAN ) 4 MG tablet Take 1 tablet (4 mg total) by mouth every 8 (eight) hours as needed for nausea or vomiting. 05/24/20   Burnetta Aures, MD  pantoprazole  (PROTONIX ) 40 MG tablet TAKE 1 TABLET BY MOUTH TWICE A DAY 08/07/23   Joshua Domino, DO  Rimegepant Sulfate (NURTEC) 75 MG  TBDP Take 1 tablet (75 mg total) by mouth daily as needed (take for abortive therapy of migraine, no more than 1 tablet in 24 hours or 10 per month). 06/03/23   Lomax, Amy, NP  rosuvastatin  (CRESTOR ) 5 MG tablet TAKE 1 TABLET (5 MG TOTAL) BY MOUTH DAILY. 06/02/23 05/27/24  Raford Riggs, MD  sertraline  (ZOLOFT ) 100 MG tablet TAKE 1 TABLET BY MOUTH EVERY DAY 05/01/23   Joshua Domino, DO  traMADol  (ULTRAM ) 50 MG tablet TAKE 1 TABLET EVERY 8 HOURS AS NEEDED FOR MODERATE PAIN 08/14/23   Kirsteins, Prentice BRAVO, MD  zonisamide  (ZONEGRAN ) 100 MG capsule Take 1 capsule (100 mg total) by mouth 2 (two) times daily. 06/03/23   Lomax, Amy, NP    Scheduled Meds:  aspirin  EC  81 mg Oral Daily   clopidogrel   75 mg Oral Daily   Continuous Infusions:  PRN Meds: acetaminophen  **OR** acetaminophen , melatonin, nitroGLYCERIN , ondansetron  (ZOFRAN ) IV  Allergies:    Allergies  Allergen Reactions   Iodine Hives and Itching    With MRI scan   Shellfish Allergy Anaphylaxis    All seafood   Crestor  [Rosuvastatin ] Itching and Other  (See Comments)    Anxiety, headaches, myalgias Able to tolerate 5mg  daily   Lipitor [Atorvastatin ] Other (See Comments)    Bad headaches    Social History:   Social History   Socioeconomic History   Marital status: Married    Spouse name: [name]   Number of children: 1   Years of education: 12+   Highest education level: Associate degree: academic program  Occupational History   Occupation: ACTIVITY DIRECTOR    Employer: [employer]   Occupation: CNA   Occupation: Retired  Tobacco Use   Smoking status: Former    Current packs/day: 0.00    Average packs/day: 1.5 packs/day for 42.6 years (64.0 ttl pk-yrs)    Types: Cigarettes    Start date: 03/10/1969    Quit date: 11/01/2011    Years since quitting: 12.2    Passive exposure: Past   Smokeless tobacco: Never   Tobacco comments:    Smoked 1.5-2 ppd for adult life (sal[place] menthols)  Vaping Use   Vaping status: Never Used  Substance and Sexual Activity   Alcohol use: No    Alcohol/week: 0.0 standard drinks of alcohol   Drug use: No   Sexual activity: Yes    Partners: Male    Birth control/protection: Surgical    Comment: 1 sexual partner in last 12 months: married  Other Topics Concern   Not on file  Social History Narrative    Patient is married ([name]).   Patient has one child and grandchild. Her sons name is Alm. Patient sees them often.   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 Patient spends 6-10 hours with her 6 days a week.    Patient has a college education.   Patient is very active in her church.    Hobbies- outside crafts and church choir.   Social Drivers of Corporate Investment Banker Strain: Low Risk  (12/21/2023)   Overall Financial Resource Strain (CARDIA)    Difficulty of Paying Living Expenses: Not hard at all  Food Insecurity: No Food Insecurity (12/21/2023)   Hunger Vital Sign    Worried About Running Out of Food in the Last Year: Never true     Ran Out of Food in the Last Year: Never true  Transportation Needs: No  Transportation Needs (12/21/2023)   PRAPARE - Administrator, Civil Service (Medical): No    Lack of Transportation (Non-Medical): No  Physical Activity: Sufficiently Active (08/21/2023)   Exercise Vital Sign    Days of Exercise per Week: 3 days    Minutes of Exercise per Session: 50 min  Recent Concern: Physical Activity - Insufficiently Active (07/17/2023)   Exercise Vital Sign    Days of Exercise per Week: 4 days    Minutes of Exercise per Session: 30 min  Stress: Stress Concern Present (08/21/2023)   [employer] of Occupational Health - Occupational Stress Questionnaire    Feeling of Stress: Very much  Social Connections: Socially Isolated (12/21/2023)   Social Connection and Isolation Panel    Frequency of Communication with Friends and Family: Once a week    Frequency of Social Gatherings with Friends and Family: Never    Attends Religious Services: 1 to 4 times per year    Active Member of [employer] or Organizations: No    Attends Banker Meetings: Not on file    Marital Status: Widowed  Intimate Partner Violence: Not At Risk (10/08/2023)   Humiliation, Afraid, Rape, and Kick questionnaire    Fear of Current or Ex-Partner: No    Emotionally Abused: No    Physically Abused: No    Sexually Abused: No    Family History:    Family History  Problem Relation Age of Onset   Cancer Mother 55       breast   Alzheimer's disease Mother    Prostate cancer Father    Lung cancer Father    Cancer Brother    Hyperlipidemia Brother    Sleep apnea Brother    Cancer Brother    Hyperlipidemia Brother    Dementia Brother        frontotemporal dementia   Dementia Maternal Grandmother    Cancer Maternal Grandfather      ROS:  Please see the history of present illness.   All other ROS reviewed and negative.     Physical Exam/Data: Vitals:   02/08/24 2115 02/08/24 2145 02/09/24 0000  02/09/24 0034  BP: 119/68 (!) 118/55 115/63 (!) 148/74  Pulse: 65 64 65 67  Resp: 18 18 18 17   Temp: 98 F (36.7 C)   98.1 F (36.7 C)  TempSrc:    Oral  SpO2: 94% 96% 96% 99%  Weight:    76.8 kg  Height:    5' 6 (1.676 m)    Intake/Output Summary (Last 24 hours) at 02/09/2024 0104 Last data filed at 02/09/2024 0040 Gross per 24 hour  Intake --  Output 300 ml  Net -300 ml      02/09/2024   12:34 AM 02/08/2024    5:41 PM 02/08/2024    5:29 PM  Last 3 Weights  Weight (lbs) 169 lb 5 oz 163 lb 170 lb  Weight (kg) 76.8 kg 73.936 kg 77.111 kg     Body mass index is 27.33 kg/m.  General:  Well nourished, well developed, in no acute distress HEENT: normal Neck: no JVD Vascular: No carotid bruits; Distal pulses 2+ bilaterally Cardiac:  normal S1, S2; RRR; no murmur  Lungs:  clear to auscultation bilaterally, no wheezing, rhonchi or rales  Abd: soft, nontender, no hepatomegaly  Ext: no edema Musculoskeletal:  No deformities, BUE and BLE strength normal and equal Skin: warm and dry  Neuro:  CNs 2-12 intact, no focal abnormalities noted Psych:  Normal  affect     Laboratory Data: High Sensitivity Troponin:   Recent Labs  Lab 02/08/24 1815 02/08/24 2107  TROPONINIHS 7 7     Chemistry Recent Labs  Lab 02/08/24 1815  NA 139  K 3.5  CL 102  CO2 25  GLUCOSE 91  BUN 22  CREATININE 1.36*  CALCIUM  8.5*  GFRNONAA 41*  ANIONGAP 12    No results for input(s): PROT, ALBUMIN , AST, ALT, ALKPHOS, BILITOT in the last 168 hours. Lipids No results for input(s): CHOL, TRIG, HDL, LABVLDL, LDLCALC, CHOLHDL in the last 168 hours.  Hematology Recent Labs  Lab 02/08/24 1815  WBC 6.6  RBC 4.31  HGB 12.2  HCT 37.8  MCV 87.7  MCH 28.3  MCHC 32.3  RDW 14.0  PLT 209   Thyroid  No results for input(s): TSH, FREET4 in the last 168 hours.  BNP Recent Labs  Lab 02/08/24 1903  BNP 65.2    DDimer No results for input(s): DDIMER in the last 168  hours.  Radiology/Studies:  DG Chest 2 View Result Date: 02/08/2024 CLINICAL DATA:  Chest pain EXAM: CHEST - 2 VIEW COMPARISON:  Chest x-ray 10/07/2023 FINDINGS: The heart size and mediastinal contours are within normal limits. Both lungs are clear. The visualized skeletal structures are unremarkable. IMPRESSION: No active cardiopulmonary disease. Electronically Signed   By: Greig Pique M.D.   On: 02/08/2024 19:23     Assessment and Plan: Chest pain, atypical  CAD s/p DES to LAD 08/25/23 CKD stage 3b,  HLD- intolerant to statins H/o left subclavian stenosis  Plan: -> Admitted under medicine with cardiology consultation so far EKG and troponins are negative, chest pain is atypical, patient has been on DAPT therapy without missed doses, recommend continue DAPT therapy. Probably will benefit from low-dose Imdur, we will initiate 15 mg daily   Repeat EKG and troponin in a.m., patient still complaining of chest pain and hypertensive, therefore good blood pressure control and medical management recommended.  Pending clinical course for final recommendations     Risk Assessment/Risk Scores:              For questions or updates, please contact [facility] Please consult [email or web address] for contact info under    Signed, Grayce Bold, MD  02/09/2024 1:04 AM

## 2024-02-09 NOTE — Care Management Obs Status (Signed)
 MEDICARE OBSERVATION STATUS NOTIFICATION   Patient Details  Name: [name] MRN: [id number] Date of Birth: 1950/09/01   Medicare Observation Status Notification Given:  Yes    Jennie Laneta Dragon 02/09/2024, 4:01 PM

## 2024-02-09 NOTE — Progress Notes (Signed)
 Mobility Specialist Progress Note;   02/09/24 0934  Mobility  Activity Ambulated with assistance  Level of Assistance Standby assist, set-up cues, supervision of patient - no hands on  Assistive Device None  Distance Ambulated (ft) 150 ft  Activity Response Tolerated fair  Mobility Referral Yes  Mobility visit 1 Mobility  Mobility Specialist Start Time (ACUTE ONLY) 0934  Mobility Specialist Stop Time (ACUTE ONLY) 0942  Mobility Specialist Time Calculation (min) (ACUTE ONLY) 8 min   Pt agreeable to mobility. Required no physical assistance during ambulation, SV for safety. HR up to 104 bpm w/ exertion. C/o chest pain and SOB throughout, RN notified. SPO2 92-94% throughout ambulation. Pt returned back to bed and left with all needs met, call bell in reach.   Lauraine Erm Mobility Specialist Please contact via SecureChat or [employer] [phone]

## 2024-02-09 NOTE — Progress Notes (Signed)
 Progress Note   Patient: [name] FMW:[id number] DOB: Sep 10, 1950 DOA: 02/08/2024     0 DOS: the patient was seen and examined on 02/09/2024   Brief hospital course: [name] is a 73 y.o. female with medical history significant for coronary artery disease status post PCI with drug-eluting stent placement to the LAD in June 2025, hypertension, hyperlipidemia, GERD, pulmonary embolism in 2017, who is admitted to [facility] on 02/08/2024 with chest pain after presenting from home to [facility] ED complaining of chest pain.   Assessment and Plan: Chest pain- non cardiac. Seen by cardiology team. S/p PCI - no significant occlusion, stable CAD. Continue aspirin , plavix .  D- diemr high. Has pleuritic chest pain. She does have prior h/o PE in 2017. V/Q scan ordered.  If V/Q low probable with discharge her.  Hypertension- Resumed home dose losartan , imdur, metoprolol .  Hyperlipidemia- Continue crestor .  GERD- Continue PPI.     Out of bed to chair. Incentive spirometry. Nursing supportive care. Fall, aspiration precautions. Diet:  Diet Orders (From admission, onward)     Start     Ordered   02/09/24 1328  Diet Heart Room service appropriate? Yes; Fluid consistency: Thin  Diet effective now       Question Answer Comment  Room service appropriate? Yes   Fluid consistency: Thin      02/09/24 1327           DVT prophylaxis: SCDs Start: 02/08/24 2340  Level of care: Telemetry   Code Status: Limited: Do not attempt resuscitation (DNR) -DNR-LIMITED -Do Not Intubate/DNI   Subjective: Patient is seen and examined today after cardiac cath procedure. Does have pleuritic chest pain. No palpitations, diaphoresis.  Physical Exam: Vitals:   02/09/24 1400 02/09/24 1415 02/09/24 1500 02/09/24 1515  BP: 133/71 130/67 (!) 109/57 119/65  Pulse: 69 69 69 70  Resp: 18 19 20 20   Temp:    97.8 F (36.6 C)  TempSrc:    Oral  SpO2: 97% 97% 92% 92%  Weight:      Height:         General - Elderly Caucasian female, no apparent distress HEENT - PERRLA, EOMI, atraumatic head, non tender sinuses. Lung - Clear, no rales, rhonchi, wheezes. Heart - S1, S2 heard, no murmurs, rubs, trace pedal edema. Abdomen - Soft, non tender, bowel sounds good Neuro - Alert, awake and oriented x 3, non focal exam. Skin - Warm and dry.  Data Reviewed:      Latest Ref Rng & Units 02/09/2024    3:19 AM 02/08/2024    6:15 PM 01/22/2024    4:38 PM  CBC  WBC 4.0 - 10.5 K/uL 4.6  6.6  7.6   Hemoglobin 12.0 - 15.0 g/dL 88.6  87.7  87.6   Hematocrit 36.0 - 46.0 % 34.5  37.8  38.8   Platelets 150 - 400 K/uL 183  209  199       Latest Ref Rng & Units 02/09/2024    3:19 AM 02/08/2024    6:15 PM 01/22/2024    4:38 PM  BMP  Glucose 70 - 99 mg/dL 88  91  90   BUN 8 - 23 mg/dL 23  22  16    Creatinine 0.44 - 1.00 mg/dL 8.68  8.63  8.75   BUN/Creat Ratio 12 - 28   13   Sodium 135 - 145 mmol/L 140  139  144   Potassium 3.5 - 5.1 mmol/L 3.7  3.5  3.6   Chloride 98 - 111 mmol/L 103  102  103   CO2 22 - 32 mmol/L 30  25  28    Calcium  8.9 - 10.3 mg/dL 8.4  8.5  9.0    CARDIAC CATHETERIZATION Result Date: 02/09/2024 Table formatting from the original result was not included. Images from the original result were not included.   Prox LAD lesion is 45% stenosed with 30% stenosed side branch in 1st Diag and 1st Sept lesion is 70% stenosed.  (Just proximal to the stent)   Previously placed mid LAD stent is widely patent.   Mid LAD to Dist LAD lesion is 20% stenosed.   Mid Cx lesion is 15% stenosed.   Prox RCA to Mid RCA lesion is 10% stenosed.   Considered nonanginal etiology for chest pain. Could also consider spasm Dominance: Right   Angiographically stable disease with widely patent LAD stent with mild step up about 45-50 % stenosis at the diagonal branch just proximal to the stent but otherwise no flow-limiting disease.  There is some moderate disease in the septal perforator to look at same  location Normal LVEDP Suspect her chest pain is not anginal in nature. RECOMMENDATIONS   In the absence of any other complications or medical issues, we expect the patient to be ready for discharge from a cath perspective on 02/09/2024.   Continue current antiplatelet agent regimen from PCI Alm Clay, MD  ECHOCARDIOGRAM COMPLETE Result Date: 02/09/2024    ECHOCARDIOGRAM REPORT   Patient Name:   [name] Date of Exam: 02/09/2024 Medical Rec #:  [id number]        Height:       66.0 in Accession #:    [id number]       Weight:       167.1 lb Date of Birth:  09-20-1950        BSA:          1.853 m Patient Age:    73 years         BP:           128/58 mmHg Patient Gender: F                HR:           74 bpm. Exam Location:  Inpatient Procedure: 2D Echo, Cardiac Doppler and Color Doppler (Both Spectral and Color            Flow Doppler were utilized during procedure). Indications:    Chest Pain  History:        Patient has prior history of Echocardiogram examinations, most                 recent 10/09/2023. Signs/Symptoms:Chest Pain and Shortness of                 Breath; Risk Factors:Dyslipidemia and Hypertension.  Sonographer:    Sherlean Dubin Referring Phys: [id number] JUSTIN B HOWERTER IMPRESSIONS  1. Left ventricular ejection fraction, by estimation, is 60 to 65%. The left ventricle has normal function. The left ventricle has no regional wall motion abnormalities. Left ventricular diastolic parameters are indeterminate.  2. Right ventricular systolic function is normal. The right ventricular size is mildly enlarged.  3. The mitral valve is normal in structure. No evidence of mitral valve regurgitation. No evidence of mitral stenosis.  4. The aortic valve is normal in structure. Aortic valve regurgitation is trivial. No aortic stenosis is present.  5. The inferior vena cava  is normal in size with greater than 50% respiratory variability, suggesting right atrial pressure of 3 mmHg. FINDINGS  Left Ventricle:  Left ventricular ejection fraction, by estimation, is 60 to 65%. The left ventricle has normal function. The left ventricle has no regional wall motion abnormalities. The left ventricular internal cavity size was normal in size. There is  no left ventricular hypertrophy. Left ventricular diastolic parameters are indeterminate. Right Ventricle: The right ventricular size is mildly enlarged. No increase in right ventricular wall thickness. Right ventricular systolic function is normal. Left Atrium: Left atrial size was normal in size. Right Atrium: Right atrial size was normal in size. Pericardium: There is no evidence of pericardial effusion. Presence of epicardial fat layer. Mitral Valve: The mitral valve is normal in structure. No evidence of mitral valve regurgitation. No evidence of mitral valve stenosis. Tricuspid Valve: The tricuspid valve is normal in structure. Tricuspid valve regurgitation is not demonstrated. No evidence of tricuspid stenosis. Aortic Valve: The aortic valve is normal in structure. Aortic valve regurgitation is trivial. No aortic stenosis is present. Aortic valve mean gradient measures 8.0 mmHg. Aortic valve peak gradient measures 16.0 mmHg. Aortic valve area, by VTI measures 1.59 cm. Pulmonic Valve: The pulmonic valve was normal in structure. Pulmonic valve regurgitation is not visualized. No evidence of pulmonic stenosis. Aorta: The aortic root is normal in size and structure. Venous: The inferior vena cava is normal in size with greater than 50% respiratory variability, suggesting right atrial pressure of 3 mmHg. IAS/Shunts: No atrial level shunt detected by color flow Doppler.  LEFT VENTRICLE PLAX 2D LVIDd:         4.40 cm   Diastology LVIDs:         2.90 cm   LV e' medial:    5.66 cm/s LV PW:         0.70 cm   LV E/e' medial:  15.5 LV IVS:        0.90 cm   LV e' lateral:   8.05 cm/s LVOT diam:     1.70 cm   LV E/e' lateral: 10.9 LV SV:         61 LV SV Index:   33 LVOT Area:     2.27  cm  RIGHT VENTRICLE RV S prime:     14.30 cm/s TAPSE (M-mode): 3.0 cm LEFT ATRIUM             Index        RIGHT ATRIUM           Index LA diam:        3.20 cm 1.73 cm/m   RA Area:     15.10 cm LA Vol (A2C):   38.9 ml 21.00 ml/m  RA Volume:   40.00 ml  21.59 ml/m LA Vol (A4C):   43.6 ml 23.53 ml/m LA Biplane Vol: 54.3 ml 29.31 ml/m  AORTIC VALVE AV Area (Vmax):    1.51 cm AV Area (Vmean):   1.47 cm AV Area (VTI):     1.59 cm AV Vmax:           200.00 cm/s AV Vmean:          129.000 cm/s AV VTI:            0.384 m AV Peak Grad:      16.0 mmHg AV Mean Grad:      8.0 mmHg LVOT Vmax:         133.00 cm/s LVOT Vmean:  83.600 cm/s LVOT VTI:          0.269 m LVOT/AV VTI ratio: 0.70  AORTA Ao Root diam: 2.30 cm Ao Asc diam:  2.90 cm MITRAL VALVE               TRICUSPID VALVE MV Area (PHT): 3.61 cm    TR Peak grad:   24.4 mmHg MV Decel Time: 210 msec    TR Vmax:        247.00 cm/s MV E velocity: 88.00 cm/s MV A velocity: 96.40 cm/s  SHUNTS MV E/A ratio:  0.91        Systemic VTI:  0.27 m                            Systemic Diam: 1.70 cm Dub Tobb DO Electronically signed by Dub Huntsman DO Signature Date/Time: 02/09/2024/1:13:55 PM    Final    DG Chest 2 View Result Date: 02/08/2024 CLINICAL DATA:  Chest pain EXAM: CHEST - 2 VIEW COMPARISON:  Chest x-ray 10/07/2023 FINDINGS: The heart size and mediastinal contours are within normal limits. Both lungs are clear. The visualized skeletal structures are unremarkable. IMPRESSION: No active cardiopulmonary disease. Electronically Signed   By: Greig Pique M.D.   On: 02/08/2024 19:23    Family Communication: Discussed with patient, understand and agree. All questions answered.  Disposition: Status is: Observation The patient remains OBS appropriate and will d/c before 2 midnights.  Planned Discharge Destination: Home     Time spent: 42 minutes  Author: Concepcion Riser, MD 02/09/2024 4:08 PM Secure chat 7am to 7pm For on call review  [email or web address].

## 2024-02-09 NOTE — H&P (View-Only) (Signed)
 Progress Note  Patient Name: [name] Date of Encounter: 02/09/2024 [facility] Cardiologist: Annabella Scarce, MD   Interval Summary   Patient reported that she came to the hospital because of 5 days of chest pain, shortness of breath, diaphoresis, lower extremity edema, and some PND.  Denies any nausea, vomiting, fever, chills, melena, hematuria, or hematochezia.  Reported that the chest pain is worse with movement and better when lying still.  The chest pain radiates into her left arm.  The patient's chest pain was repeatable with palpation.  Denies any recent strenuous activities.  The patient is fairly active and does all of her own laundry, cooking, vacuuming, and other tasks.  She is also working as a musician.  Has become more short of breath doing her usual activities.  Denies any alcohol use, nicotine use, or illicit substance use.  Vital Signs Vitals:   02/09/24 0621 02/09/24 0748 02/09/24 0840 02/09/24 0841  BP:  (!) 124/56 (!) 128/58 (!) 128/58  Pulse:  70 71 71  Resp:  15 16   Temp:  98 F (36.7 C)    TempSrc:  Oral    SpO2:  93%    Weight: 75.8 kg     Height:        Intake/Output Summary (Last 24 hours) at 02/09/2024 0903 Last data filed at 02/09/2024 0618 Gross per 24 hour  Intake --  Output 600 ml  Net -600 ml      02/09/2024    6:21 AM 02/09/2024   12:34 AM 02/08/2024    5:41 PM  Last 3 Weights  Weight (lbs) 167 lb 1.7 oz 169 lb 5 oz 163 lb  Weight (kg) 75.8 kg 76.8 kg 73.936 kg      Telemetry/ECG  Normal sinus rhythm with heart rates in the 70s to 80s- Personally Reviewed  Physical Exam  GEN: No acute distress.  Alert and orientated on room air. Neck: No JVD Cardiac: RRR, 1 out of 6 systolic murmur on upper sternal border.  No rubs, or gallops.  Chest was tender to palpation and felt similar to the pain she was experiencing. Respiratory: Clear to auscultation bilaterally. GI: Soft, nontender, non-distended  MS: No  edema  Assessment & Plan   [name] is a 73 y.o. female with a hx of HTN, CAD s/p DES-LAD 08/25/2023, HLD (intolerant to Atorvastatin , Praluent ), OSA on CPAP, Chronic diastolic heart failure, CKD III, CVA, left subclavian artery stenosis, prior tobacco use, prior DVT 2019 treated with Eliquis  for 6 months.  who is being seen for the evaluation of chest pain.   Chest pain CAD s/p DES to the LAD on 08/2023 Hyperlipidemia Cardiac catheterization on 08/2023 showed 85% stenosis in the proximal to mid LAD, 10% stenosis in the proximal to mid RCA, 15% stenosis in the mid LCx, and 20% stenosis in the mid to distal LAD.  A drug-eluting stent was placed in the LAD. The patient was seen by the overnight fellow and was started on Imdur 15 mg daily.  On exam the patient had mixed features of atypical and typical ACS. High-sensitivity troponins 7 > 7 > 7. EKG showed normal sinus rhythm with a rate of 69. Hemoglobin A1c 5.6.  Denies alcohol use, nicotine use, or illicit substance use. Has a history of statin intolerance Order lipid panel Echo pending Continue aspirin  81 mg daily. Continue Plavix  75 mg daily. Continue metoprolol  succinate 25 mg daily Continue Imdur 15 mg daily Continue Crestor  5  mg daily Continue home Leqvio  after discharge. With recent cardiac catheterization on 08/2023, negative high-sensitivity troponins, nonischemic EKG, and mixed features of ACS.  Assuming no wall motion abnormalities on echo may consider outpatient nuclear stress test.   CKD stage III Creatinine 1.31.  GFR 43.  This appears to be about the patient's baseline.   Hypertension Most recent BP was 128/58.  Continue losartan  25 mg daily Continue metoprolol  succinate 25 mg daily   Chronic diastolic heart failure Prior to admission was on as needed Lasix . Appears euvolemic on exam.       For questions or updates, please contact [facility] Please consult [email or web address] for contact info under        Signed, Morse Clause, PA-C  02/09/2024 8:54 AM

## 2024-02-09 NOTE — Progress Notes (Addendum)
 Cardiac cath results from today: Very stable CAD with widely patent proximal to mid LAD stent with no ISR, 20% mid LAD, 15% mid left circumflex and 10% proximal to mid RCA.  2D echo this admission: EF 60 to 65% with normal wall motion, normal RV function with mild RVE, trivial AI  Patient's chest pain is noncardiac and likely musculoskeletal. Her DDimer was mildly elevated so to be complete consider VQ scan or chest CT but will defer to [facility]  No further cardiac recommendations at this time.  Will sign off.    [facility] will sign off.   Medication Recommendations: Aspirin  81 mg daily, Plavix  75 mg daily, losartan  25 mg daily, Toprol -XL 25 mg daily and Crestor  5 mg daily, Lasix  40 mg 1 tablet Monday Wednesday and Friday which an additional tablet with weight gain greater than 3 pounds in 1 day or 5 pounds in a week, Leqvio  Other recommendations (labs, testing, etc): None Follow up as an outpatient: Dr. Raford in 3 to 4 weeks

## 2024-02-09 NOTE — Progress Notes (Signed)
 Progress Note  Patient Name: [name] Date of Encounter: 02/09/2024 [facility] Cardiologist: Annabella Scarce, MD   Interval Summary   Patient reported that she came to the hospital because of 5 days of chest pain, shortness of breath, diaphoresis, lower extremity edema, and some PND.  Denies any nausea, vomiting, fever, chills, melena, hematuria, or hematochezia.  Reported that the chest pain is worse with movement and better when lying still.  The chest pain radiates into her left arm.  The patient's chest pain was repeatable with palpation.  Denies any recent strenuous activities.  The patient is fairly active and does all of her own laundry, cooking, vacuuming, and other tasks.  She is also working as a musician.  Has become more short of breath doing her usual activities.  Denies any alcohol use, nicotine use, or illicit substance use.  Vital Signs Vitals:   02/09/24 0621 02/09/24 0748 02/09/24 0840 02/09/24 0841  BP:  (!) 124/56 (!) 128/58 (!) 128/58  Pulse:  70 71 71  Resp:  15 16   Temp:  98 F (36.7 C)    TempSrc:  Oral    SpO2:  93%    Weight: 75.8 kg     Height:        Intake/Output Summary (Last 24 hours) at 02/09/2024 0903 Last data filed at 02/09/2024 0618 Gross per 24 hour  Intake --  Output 600 ml  Net -600 ml      02/09/2024    6:21 AM 02/09/2024   12:34 AM 02/08/2024    5:41 PM  Last 3 Weights  Weight (lbs) 167 lb 1.7 oz 169 lb 5 oz 163 lb  Weight (kg) 75.8 kg 76.8 kg 73.936 kg      Telemetry/ECG  Normal sinus rhythm with heart rates in the 70s to 80s- Personally Reviewed  Physical Exam  GEN: No acute distress.  Alert and orientated on room air. Neck: No JVD Cardiac: RRR, 1 out of 6 systolic murmur on upper sternal border.  No rubs, or gallops.  Chest was tender to palpation and felt similar to the pain she was experiencing. Respiratory: Clear to auscultation bilaterally. GI: Soft, nontender, non-distended  MS: No  edema  Assessment & Plan   [name] is a 73 y.o. female with a hx of HTN, CAD s/p DES-LAD 08/25/2023, HLD (intolerant to Atorvastatin , Praluent ), OSA on CPAP, Chronic diastolic heart failure, CKD III, CVA, left subclavian artery stenosis, prior tobacco use, prior DVT 2019 treated with Eliquis  for 6 months.  who is being seen for the evaluation of chest pain.   Chest pain CAD s/p DES to the LAD on 08/2023 Hyperlipidemia Cardiac catheterization on 08/2023 showed 85% stenosis in the proximal to mid LAD, 10% stenosis in the proximal to mid RCA, 15% stenosis in the mid LCx, and 20% stenosis in the mid to distal LAD.  A drug-eluting stent was placed in the LAD. The patient was seen by the overnight fellow and was started on Imdur 15 mg daily.  On exam the patient had mixed features of atypical and typical ACS. High-sensitivity troponins 7 > 7 > 7. EKG showed normal sinus rhythm with a rate of 69. Hemoglobin A1c 5.6.  Denies alcohol use, nicotine use, or illicit substance use. Has a history of statin intolerance Order lipid panel Echo pending Continue aspirin  81 mg daily. Continue Plavix  75 mg daily. Continue metoprolol  succinate 25 mg daily Continue Imdur 15 mg daily Continue Crestor  5  mg daily Continue home Leqvio  after discharge. With recent cardiac catheterization on 08/2023, negative high-sensitivity troponins, nonischemic EKG, and mixed features of ACS.  Assuming no wall motion abnormalities on echo may consider outpatient nuclear stress test.   CKD stage III Creatinine 1.31.  GFR 43.  This appears to be about the patient's baseline.   Hypertension Most recent BP was 128/58.  Continue losartan  25 mg daily Continue metoprolol  succinate 25 mg daily   Chronic diastolic heart failure Prior to admission was on as needed Lasix . Appears euvolemic on exam.       For questions or updates, please contact [facility] Please consult [email or web address] for contact info under        Signed, Morse Clause, PA-C  02/09/2024 8:54 AM

## 2024-02-09 NOTE — Interval H&P Note (Signed)
 History and Physical Interval Note:  02/09/2024 12:19 PM  [name]  has presented today for surgery, with the diagnosis of unstable angina.  The various methods of treatment have been discussed with the patient and family. After consideration of risks, benefits and other options for treatment, the patient has consented to  Procedure(s): LEFT HEART CATH AND CORONARY ANGIOGRAPHY (N/A)  PERCUTANEOUS CORONARY INTERVENTION  as a surgical intervention.  The patient's history has been reviewed, patient examined, no change in status, stable for surgery.  I have reviewed the patient's chart and labs.  Questions were answered to the patient's satisfaction.    Cath Lab Visit (complete for each Cath Lab visit)  Clinical Evaluation Leading to the Procedure:   ACS: Yes.    Non-ACS:    Anginal Classification: CCS IV  Anti-ischemic medical therapy: Minimal Therapy (1 class of medications)  Non-Invasive Test Results: No non-invasive testing performed  Prior CABG: No previous CABG      Alm Clay

## 2024-02-10 ENCOUNTER — Observation Stay (HOSPITAL_COMMUNITY)

## 2024-02-10 DIAGNOSIS — R079 Chest pain, unspecified: Secondary | ICD-10-CM | POA: Diagnosis not present

## 2024-02-10 DIAGNOSIS — I771 Stricture of artery: Secondary | ICD-10-CM | POA: Diagnosis not present

## 2024-02-10 DIAGNOSIS — R0789 Other chest pain: Secondary | ICD-10-CM | POA: Diagnosis not present

## 2024-02-10 DIAGNOSIS — I25110 Atherosclerotic heart disease of native coronary artery with unstable angina pectoris: Secondary | ICD-10-CM | POA: Diagnosis not present

## 2024-02-10 DIAGNOSIS — Z8679 Personal history of other diseases of the circulatory system: Secondary | ICD-10-CM | POA: Diagnosis not present

## 2024-02-10 DIAGNOSIS — K219 Gastro-esophageal reflux disease without esophagitis: Secondary | ICD-10-CM | POA: Diagnosis not present

## 2024-02-10 DIAGNOSIS — I200 Unstable angina: Secondary | ICD-10-CM | POA: Diagnosis not present

## 2024-02-10 DIAGNOSIS — E782 Mixed hyperlipidemia: Secondary | ICD-10-CM | POA: Diagnosis not present

## 2024-02-10 MED ORDER — FUROSEMIDE 40 MG PO TABS
40.0000 mg | ORAL_TABLET | Freq: Every day | ORAL | Status: DC
  Filled 2024-02-10: qty 1

## 2024-02-10 MED ORDER — TECHNETIUM TO 99M ALBUMIN AGGREGATED
4.0000 | Freq: Once | INTRAVENOUS | Status: AC
  Administered 2024-02-10: 4.25 via INTRAVENOUS

## 2024-02-10 NOTE — Discharge Summary (Signed)
 Physician Discharge Summary   Patient: [name] MRN: [id number] DOB: 02-15-1951  Admit date:     02/08/2024  Discharge date: {dischdate:26783}  Discharge Physician: Concepcion Riser   PCP: Lonnie Earnest, MD   Recommendations at discharge:  {Tip this will not be part of the note when signed- Example include specific recommendations for outpatient follow-up, pending tests to follow-up on. (Optional):26781}  ***  Discharge Diagnoses: Principal Problem:   Chest pain Active Problems:   HLD (hyperlipidemia)   Shortness of breath   GERD (gastroesophageal reflux disease)   History of essential hypertension   Allergic rhinitis   Unstable angina (HCC)  Resolved Problems:   * No resolved hospital problems. [facility] Course: No notes on file  Assessment and Plan: No notes have been filed under this hospital service. Service: Hospitalist     {Tip this will not be part of the note when signed Body mass index is 26.97 kg/m. , ,  (Optional):26781}  {(NOTE) Pain control PDMP Statment (Optional):26782} Consultants: *** Procedures performed: ***  Disposition: {Plan; Disposition:26390} Diet recommendation:  Discharge Diet Orders (From admission, onward)     Start     Ordered   02/10/24 0000  Diet - low sodium heart healthy        02/10/24 1443           {Diet_Plan:26776} DISCHARGE MEDICATION: Allergies as of 02/10/2024       Reactions   Iodine Hives, Itching   With MRI scan   Shellfish Allergy Anaphylaxis   All seafood   Crestor  [rosuvastatin ] Itching, Other (See Comments)   Anxiety, headaches, myalgias Able to tolerate 5mg  daily   Lipitor [atorvastatin ] Other (See Comments)   Bad headaches        Medication List     TAKE these medications    albuterol  108 (90 Base) MCG/ACT inhaler Commonly known as: VENTOLIN  HFA Inhale 2 puffs into the lungs every 6 (six) hours as needed for wheezing or shortness of breath.   aspirin  EC 81 MG tablet Take  1 tablet (81 mg total) by mouth daily. Swallow whole.   BIOTIN PO Take 1 capsule by mouth daily. Dosage unknown   CALCIUM  PO Take 1 tablet by mouth daily. Dosage unknown   cetirizine  10 MG tablet Commonly known as: ZYRTEC  Take 1 tablet (10 mg total) by mouth daily with breakfast.   CHOLECALCIFEROL PO Take 1 tablet by mouth daily. Dosage unknown   clopidogrel  75 MG tablet Commonly known as: PLAVIX  TAKE 1 TABLET BY MOUTH EVERY DAY   Emgality  120 MG/ML Soaj Generic drug: Galcanezumab -gnlm Inject 120 mg into the skin every 30 (thirty) days.   fluticasone  50 MCG/ACT nasal spray Commonly known as: FLONASE  INSTILL 1 SPRAY INTO BOTH NOSTRILS DAILY   furosemide  40 MG tablet Commonly known as: LASIX  Take one tablet three times per week on Mondays, Wednesdays, Fridays. May take an additional tablet as needed for weight gain greater than 3lbs in one day or 5lbs in a week.   gabapentin  600 MG tablet Commonly known as: NEURONTIN  TAKE 1 TABLET BY MOUTH THREE TIMES A DAY   imipramine  25 MG tablet Commonly known as: TOFRANIL  Take 1 tablet (25 mg total) by mouth at bedtime.   Leqvio  284 MG/1.5ML Sosy injection Generic drug: inclisiran Inject as directed   losartan  25 MG tablet Commonly known as: COZAAR  TAKE 1 TABLET (25 MG TOTAL) BY MOUTH DAILY.   metoprolol  succinate 25 MG 24 hr tablet Commonly known as: TOPROL -XL Take 1  tablet (25 mg total) by mouth daily.   Nurtec 75 MG Tbdp Generic drug: Rimegepant Sulfate Take 1 tablet (75 mg total) by mouth daily as needed (take for abortive therapy of migraine, no more than 1 tablet in 24 hours or 10 per month).   ondansetron  4 MG tablet Commonly known as: Zofran  Take 1 tablet (4 mg total) by mouth every 8 (eight) hours as needed for nausea or vomiting.   pantoprazole  40 MG tablet Commonly known as: PROTONIX  TAKE 1 TABLET BY MOUTH TWICE A DAY   rosuvastatin  5 MG tablet Commonly known as: CRESTOR  TAKE 1 TABLET (5 MG TOTAL) BY  MOUTH DAILY.   sertraline  100 MG tablet Commonly known as: ZOLOFT  TAKE 1 TABLET BY MOUTH EVERY DAY   traMADol  50 MG tablet Commonly known as: ULTRAM  TAKE 1 TABLET EVERY 8 HOURS AS NEEDED FOR MODERATE PAIN   zonisamide  100 MG capsule Commonly known as: ZONEGRAN  Take 1 capsule (100 mg total) by mouth 2 (two) times daily.        Discharge Exam: Filed Weights   02/09/24 0034 02/09/24 0621 02/10/24 0415  Weight: 76.8 kg 75.8 kg 75.8 kg   ***  Condition at discharge: {DC Condition:26389}  The results of significant diagnostics from this hospitalization (including imaging, microbiology, ancillary and laboratory) are listed below for reference.   Imaging Studies: NM Pulmonary Perfusion Result Date: 02/10/2024 CLINICAL DATA:  Chest pain radiating down the right arm. Shortness of breath on exertion. EXAM: NUCLEAR MEDICINE PERFUSION LUNG SCAN TECHNIQUE: Perfusion images were obtained in multiple projections after intravenous injection of radiopharmaceutical. Ventilation scans intentionally deferred if perfusion scan and chest x-ray adequate for interpretation during COVID 19 epidemic. RADIOPHARMACEUTICALS:  4.25 mCi Tc-20m MAA IV COMPARISON:  Portable chest radiograph obtained today. Nuclear medicine perfusion lung scan dated 10/08/2023. FINDINGS: Stable wedge-shaped perfusion defect along the superior aspect of the major fissure on the left. Interval indistinct subsegmental wedge-shaped perfusion defect in the anterior right upper lobe only seen in the lateral projection and interval linear defect in the posterior right upper lung zone only seen in the right anterior oblique projection. No corresponding radiographic abnormality. IMPRESSION: Low probability for acute pulmonary embolism. Electronically Signed   By: Elspeth Bathe M.D.   On: 02/10/2024 14:31   DG CHEST PORT 1 VIEW Result Date: 02/10/2024 CLINICAL DATA:  Chest pain for the past 5 days. EXAM: PORTABLE CHEST 1 VIEW COMPARISON:   02/08/2024 FINDINGS: Stable normal-sized heart. Tortuous and partially calcified thoracic aorta. Clear lungs with normal vascularity. Stable linear scar at the left lung base. Old, healed right humeral neck fracture. Cervical spine degenerative changes. IMPRESSION: No acute abnormality. Electronically Signed   By: Elspeth Bathe M.D.   On: 02/10/2024 14:07   CARDIAC CATHETERIZATION Result Date: 02/09/2024 Table formatting from the original result was not included. Images from the original result were not included.   Prox LAD lesion is 45% stenosed with 30% stenosed side branch in 1st Diag and 1st Sept lesion is 70% stenosed.  (Just proximal to the stent)   Previously placed mid LAD stent is widely patent.   Mid LAD to Dist LAD lesion is 20% stenosed.   Mid Cx lesion is 15% stenosed.   Prox RCA to Mid RCA lesion is 10% stenosed.   Considered nonanginal etiology for chest pain. Could also consider spasm Dominance: Right   Angiographically stable disease with widely patent LAD stent with mild step up about 45-50 % stenosis at the diagonal branch just proximal to  the stent but otherwise no flow-limiting disease.  There is some moderate disease in the septal perforator to look at same location Normal LVEDP Suspect her chest pain is not anginal in nature. RECOMMENDATIONS   In the absence of any other complications or medical issues, we expect the patient to be ready for discharge from a cath perspective on 02/09/2024.   Continue current antiplatelet agent regimen from PCI Alm Clay, MD  ECHOCARDIOGRAM COMPLETE Result Date: 02/09/2024    ECHOCARDIOGRAM REPORT   Patient Name:   FLETA BORGESON Date of Exam: 02/09/2024 Medical Rec #:  [id number]        Height:       66.0 in Accession #:    [id number]       Weight:       167.1 lb Date of Birth:  28-Dec-1950        BSA:          1.853 m Patient Age:    73 years         BP:           128/58 mmHg Patient Gender: F                HR:           74 bpm. Exam Location:   Inpatient Procedure: 2D Echo, Cardiac Doppler and Color Doppler (Both Spectral and Color            Flow Doppler were utilized during procedure). Indications:    Chest Pain  History:        Patient has prior history of Echocardiogram examinations, most                 recent 10/09/2023. Signs/Symptoms:Chest Pain and Shortness of                 Breath; Risk Factors:Dyslipidemia and Hypertension.  Sonographer:    Sherlean Dubin Referring Phys: [id number] JUSTIN B HOWERTER IMPRESSIONS  1. Left ventricular ejection fraction, by estimation, is 60 to 65%. The left ventricle has normal function. The left ventricle has no regional wall motion abnormalities. Left ventricular diastolic parameters are indeterminate.  2. Right ventricular systolic function is normal. The right ventricular size is mildly enlarged.  3. The mitral valve is normal in structure. No evidence of mitral valve regurgitation. No evidence of mitral stenosis.  4. The aortic valve is normal in structure. Aortic valve regurgitation is trivial. No aortic stenosis is present.  5. The inferior vena cava is normal in size with greater than 50% respiratory variability, suggesting right atrial pressure of 3 mmHg. FINDINGS  Left Ventricle: Left ventricular ejection fraction, by estimation, is 60 to 65%. The left ventricle has normal function. The left ventricle has no regional wall motion abnormalities. The left ventricular internal cavity size was normal in size. There is  no left ventricular hypertrophy. Left ventricular diastolic parameters are indeterminate. Right Ventricle: The right ventricular size is mildly enlarged. No increase in right ventricular wall thickness. Right ventricular systolic function is normal. Left Atrium: Left atrial size was normal in size. Right Atrium: Right atrial size was normal in size. Pericardium: There is no evidence of pericardial effusion. Presence of epicardial fat layer. Mitral Valve: The mitral valve is normal in structure. No  evidence of mitral valve regurgitation. No evidence of mitral valve stenosis. Tricuspid Valve: The tricuspid valve is normal in structure. Tricuspid valve regurgitation is not demonstrated. No evidence of tricuspid stenosis. Aortic Valve: The aortic valve is  normal in structure. Aortic valve regurgitation is trivial. No aortic stenosis is present. Aortic valve mean gradient measures 8.0 mmHg. Aortic valve peak gradient measures 16.0 mmHg. Aortic valve area, by VTI measures 1.59 cm. Pulmonic Valve: The pulmonic valve was normal in structure. Pulmonic valve regurgitation is not visualized. No evidence of pulmonic stenosis. Aorta: The aortic root is normal in size and structure. Venous: The inferior vena cava is normal in size with greater than 50% respiratory variability, suggesting right atrial pressure of 3 mmHg. IAS/Shunts: No atrial level shunt detected by color flow Doppler.  LEFT VENTRICLE PLAX 2D LVIDd:         4.40 cm   Diastology LVIDs:         2.90 cm   LV e' medial:    5.66 cm/s LV PW:         0.70 cm   LV E/e' medial:  15.5 LV IVS:        0.90 cm   LV e' lateral:   8.05 cm/s LVOT diam:     1.70 cm   LV E/e' lateral: 10.9 LV SV:         61 LV SV Index:   33 LVOT Area:     2.27 cm  RIGHT VENTRICLE RV S prime:     14.30 cm/s TAPSE (M-mode): 3.0 cm LEFT ATRIUM             Index        RIGHT ATRIUM           Index LA diam:        3.20 cm 1.73 cm/m   RA Area:     15.10 cm LA Vol (A2C):   38.9 ml 21.00 ml/m  RA Volume:   40.00 ml  21.59 ml/m LA Vol (A4C):   43.6 ml 23.53 ml/m LA Biplane Vol: 54.3 ml 29.31 ml/m  AORTIC VALVE AV Area (Vmax):    1.51 cm AV Area (Vmean):   1.47 cm AV Area (VTI):     1.59 cm AV Vmax:           200.00 cm/s AV Vmean:          129.000 cm/s AV VTI:            0.384 m AV Peak Grad:      16.0 mmHg AV Mean Grad:      8.0 mmHg LVOT Vmax:         133.00 cm/s LVOT Vmean:        83.600 cm/s LVOT VTI:          0.269 m LVOT/AV VTI ratio: 0.70  AORTA Ao Root diam: 2.30 cm Ao Asc diam:   2.90 cm MITRAL VALVE               TRICUSPID VALVE MV Area (PHT): 3.61 cm    TR Peak grad:   24.4 mmHg MV Decel Time: 210 msec    TR Vmax:        247.00 cm/s MV E velocity: 88.00 cm/s MV A velocity: 96.40 cm/s  SHUNTS MV E/A ratio:  0.91        Systemic VTI:  0.27 m                            Systemic Diam: 1.70 cm Kardie Tobb DO Electronically signed by Dub Huntsman DO Signature Date/Time: 02/09/2024/1:13:55 PM    Final    DG  Chest 2 View Result Date: 02/08/2024 CLINICAL DATA:  Chest pain EXAM: CHEST - 2 VIEW COMPARISON:  Chest x-ray 10/07/2023 FINDINGS: The heart size and mediastinal contours are within normal limits. Both lungs are clear. The visualized skeletal structures are unremarkable. IMPRESSION: No active cardiopulmonary disease. Electronically Signed   By: Greig Pique M.D.   On: 02/08/2024 19:23    Microbiology: Results for orders placed or performed in visit on 07/13/23  Urine Culture     Status: None   Collection Time: 07/13/23 10:30 AM   Specimen: Urine   UR  Result Value Ref Range Status   Urine Culture, Routine Final report  Final   Organism ID, Bacteria No growth  Final   *Note: Due to a large number of results and/or encounters for the requested time period, some results have not been displayed. A complete set of results can be found in Results Review.    Labs: CBC: Recent Labs  Lab 02/08/24 1815 02/09/24 0319  WBC 6.6 4.6  NEUTROABS  --  2.5  HGB 12.2 11.3*  HCT 37.8 34.5*  MCV 87.7 86.5  PLT 209 183   Basic Metabolic Panel: Recent Labs  Lab 02/08/24 1815 02/09/24 0319  NA 139 140  K 3.5 3.7  CL 102 103  CO2 25 30  GLUCOSE 91 88  BUN 22 23  CREATININE 1.36* 1.31*  CALCIUM  8.5* 8.4*  MG  --  2.1   Liver Function Tests: Recent Labs  Lab 02/09/24 0319  AST 19  ALT 12  ALKPHOS 64  BILITOT 0.6  PROT 5.5*  ALBUMIN  3.2*   CBG: No results for input(s): GLUCAP in the last 168 hours.  Discharge time spent: {LESS THAN/GREATER UYJW:73611} 30  minutes.  Signed: Concepcion Riser, MD [facility] 02/10/2024

## 2024-02-10 NOTE — Progress Notes (Addendum)
 Mobility Specialist Progress Note;   02/10/24 1037  Mobility  Activity Ambulated independently  Level of Assistance Standby assist, set-up cues, supervision of patient - no hands on  Assistive Device None  Distance Ambulated (ft) 225 ft  Activity Response Tolerated well  Mobility Referral Yes  Mobility visit 1 Mobility  Mobility Specialist Start Time (ACUTE ONLY) 1037  Mobility Specialist Stop Time (ACUTE ONLY) 1043  Mobility Specialist Time Calculation (min) (ACUTE ONLY) 6 min   Pt agreeable to mobility. Required no physical assistance during ambulation, SV for safety. CP improved this session. C/o SOB during ambulation however SPO2 96% when checked. HR up to 112 bpm w/ exertion. Pt returned back to bed and left with all needs met, call bell in reach. Daughter present.   Lauraine Erm Mobility Specialist Please contact via SecureChat or [employer] [phone]

## 2024-02-10 NOTE — Progress Notes (Signed)
 Discharge teaching complete including meds, diet, activity, follow up appointments and TR band care reviewed and all questions answered. Copy of instructions given to patient. Patient discharged home via wheelchair with daughter.

## 2024-02-12 ENCOUNTER — Telehealth: Payer: Self-pay

## 2024-02-12 NOTE — Telephone Encounter (Signed)
 Patient returned call. Advised of message per Dr. Lonnie.   She will follow up with Neurology.   Chiquita JAYSON English, RN

## 2024-02-12 NOTE — Telephone Encounter (Signed)
 Called patient. She did not answer, LVM requesting returned call.   Courtney JAYSON English, RN

## 2024-02-12 NOTE — Telephone Encounter (Signed)
 Patient LVM on nurse line regarding prescription refill on Trazodone .   Patient reports that this was discussed at visit on Monday, however, was not sent in due to her having to go to the hospital.   Patient is requesting refill to be sent to [facility].   Chiquita JAYSON English, RN

## 2024-02-17 ENCOUNTER — Ambulatory Visit

## 2024-02-17 VITALS — BP 100/64 | HR 74 | Temp 98.4°F | Resp 18 | Ht 66.0 in | Wt 168.0 lb

## 2024-02-17 DIAGNOSIS — I739 Peripheral vascular disease, unspecified: Secondary | ICD-10-CM

## 2024-02-17 DIAGNOSIS — E785 Hyperlipidemia, unspecified: Secondary | ICD-10-CM | POA: Diagnosis not present

## 2024-02-17 DIAGNOSIS — G459 Transient cerebral ischemic attack, unspecified: Secondary | ICD-10-CM | POA: Diagnosis not present

## 2024-02-17 MED ORDER — INCLISIRAN SODIUM 284 MG/1.5ML ~~LOC~~ SOSY
284.0000 mg | PREFILLED_SYRINGE | Freq: Once | SUBCUTANEOUS | Status: AC
  Administered 2024-02-17: 284 mg via SUBCUTANEOUS
  Filled 2024-02-17: qty 1.5

## 2024-02-17 NOTE — Progress Notes (Signed)
 Diagnosis: Hyperlipidemia  Provider:  Chilton Greathouse MD  Procedure: Injection  Leqvio (inclisiran), Dose: 284 mg, Site: subcutaneous, Number of injections: 1  Injection Site(s): Right arm  Post Care:  right arm injection  Discharge: Condition: Good, Destination: Home . AVS Declined  Performed by:  Rico Ala, LPN

## 2024-02-24 ENCOUNTER — Other Ambulatory Visit: Payer: Self-pay | Admitting: Family Medicine

## 2024-02-29 ENCOUNTER — Telehealth: Payer: Self-pay | Admitting: Neurology

## 2024-02-29 MED ORDER — ZONISAMIDE 100 MG PO CAPS: 100.0000 mg | ORAL_CAPSULE | Freq: Two times a day (BID) | ORAL | 3 refills | Status: DC

## 2024-02-29 NOTE — Telephone Encounter (Signed)
 Pt reports she is being told by [id number] they are unable to fill either zonisamide  (ZONEGRAN ) 100 MG capsule or Trazodone .  Pt states re: the Trazodone  she was told it was being changed to another medication but she has not heard any more about that, please call pt to discuss.

## 2024-03-01 MED ORDER — TRAZODONE HCL 50 MG PO TABS: ORAL_TABLET | ORAL | 0 refills | Status: AC

## 2024-03-01 NOTE — Telephone Encounter (Signed)
 Called and lvm 1st attempt by hf 03/01/24

## 2024-03-01 NOTE — Telephone Encounter (Signed)
 Pt aware and voiced gratitude and understanding

## 2024-03-01 NOTE — Telephone Encounter (Signed)
 Pt called in and stated that the insurance didn't approve the impiramine and was verbally told by Dr. Vear staff via phone call (NO documentation of Dr. Vear approving this) to go back on trazadone. Routing to np since attending md isn't here

## 2024-03-01 NOTE — Addendum Note (Signed)
 Addended by: CARY NO L on: 03/01/2024 03:16 PM   Modules accepted: Orders

## 2024-03-01 NOTE — Telephone Encounter (Signed)
 Called and left voicemail for patient to ask she she cancelled the tofranil  herself

## 2024-03-01 NOTE — Telephone Encounter (Signed)
 Called pharmacy and rung for 6+ mins then busy tone 1st attempt by hf 03/01/24

## 2024-03-01 NOTE — Telephone Encounter (Signed)
 Called and spoke to pharmacy team member and they stated they it was $0 dollars. Asked pharmacy team and apparently she selected to cancel it herself on the app.

## 2024-03-01 NOTE — Telephone Encounter (Signed)
 Called and spoke to pt who stated that she didn't mean to cancel it. I did let pt know that I would let Dr. Vear know. She stated that she never started the tofranil . Will route to dr. Vear as non urgent since we have a current plan.

## 2024-03-01 NOTE — Telephone Encounter (Signed)
 Please let her know that I am going to call in a months supply of trazodone  while we investigate the imipramine . Please try to contact pharmacy to get clarification on why this is not covered and what cash price may be for patient. TY!

## 2024-03-04 ENCOUNTER — Other Ambulatory Visit: Payer: Self-pay | Admitting: Neurology

## 2024-03-04 MED ORDER — ZONISAMIDE 100 MG PO CAPS: 100.0000 mg | ORAL_CAPSULE | Freq: Two times a day (BID) | ORAL | 3 refills | Status: AC

## 2024-03-07 ENCOUNTER — Telehealth: Payer: Self-pay

## 2024-03-07 ENCOUNTER — Encounter (HOSPITAL_BASED_OUTPATIENT_CLINIC_OR_DEPARTMENT_OTHER): Payer: Self-pay

## 2024-03-07 ENCOUNTER — Ambulatory Visit (HOSPITAL_BASED_OUTPATIENT_CLINIC_OR_DEPARTMENT_OTHER): Admitting: Family

## 2024-03-07 DIAGNOSIS — J111 Influenza due to unidentified influenza virus with other respiratory manifestations: Secondary | ICD-10-CM

## 2024-03-07 MED ORDER — OSELTAMIVIR PHOSPHATE 75 MG PO CAPS: 75.0000 mg | ORAL_CAPSULE | Freq: Every day | ORAL | 0 refills | Status: AC

## 2024-03-07 MED ORDER — OSELTAMIVIR PHOSPHATE 30 MG PO CAPS: 30.0000 mg | ORAL_CAPSULE | Freq: Two times a day (BID) | ORAL | 0 refills | Status: AC

## 2024-03-07 NOTE — Telephone Encounter (Signed)
 Patient calls nurse line requesting Tamiflu .   She reports she started having symptoms on Saturday and reports taking a positive home test for Flu A.   She reports nausea, vomiting, diarrhea, cough, congestion and fevers. She reports tmax 101.  She reports she has been taking Tylenol  for fevers and generalized body aches.   She denies any shortness of breath.  Advised will forward to PCP.   Conservative measures discussed, warm fluids with honey and good hydration.   ED precautions discussed with patient.

## 2024-03-08 NOTE — Telephone Encounter (Signed)
Attempted to call patient, however no answer

## 2024-03-09 ENCOUNTER — Ambulatory Visit: Payer: Self-pay | Admitting: Student

## 2024-03-16 ENCOUNTER — Other Ambulatory Visit: Payer: Self-pay | Admitting: Family Medicine

## 2024-03-21 ENCOUNTER — Telehealth: Payer: Self-pay

## 2024-03-21 NOTE — Telephone Encounter (Signed)
 Auth Submission: NO AUTH NEEDED Site of care: Site of care: [facility] Payer: Medicare A/B with supplement Medication & CPT/J Code(s) submitted: Leqvio  (Inclisiran) J1306 Diagnosis Code:  Route of submission (phone, fax, portal):  Phone # Fax # Auth type: Buy/Bill PB Units/visits requested: 284mg  x 2 doses Reference number:  Approval from: 03/21/24 to 04/09/25

## 2024-03-26 ENCOUNTER — Other Ambulatory Visit: Payer: Self-pay | Admitting: Physical Medicine & Rehabilitation

## 2024-03-27 ENCOUNTER — Other Ambulatory Visit: Payer: Self-pay | Admitting: Family Medicine

## 2024-03-27 DIAGNOSIS — I10 Essential (primary) hypertension: Secondary | ICD-10-CM

## 2024-04-15 ENCOUNTER — Encounter: Admitting: Physical Medicine & Rehabilitation

## 2024-05-05 ENCOUNTER — Encounter: Admitting: Physical Medicine & Rehabilitation

## 2024-06-29 ENCOUNTER — Ambulatory Visit (HOSPITAL_BASED_OUTPATIENT_CLINIC_OR_DEPARTMENT_OTHER): Admitting: Cardiovascular Disease

## 2024-07-18 ENCOUNTER — Encounter

## 2024-08-04 ENCOUNTER — Ambulatory Visit: Admitting: Neurology

## 2024-08-17 ENCOUNTER — Ambulatory Visit
